# Patient Record
Sex: Male | Born: 1955 | Race: Black or African American | Hispanic: No | Marital: Married | Smoking: Never smoker
Health system: Southern US, Community
[De-identification: ages and names within clinical notes are randomized; demographics above are authoritative.]

## PROBLEM LIST (undated history)

## (undated) DIAGNOSIS — E871 Hypo-osmolality and hyponatremia: Secondary | ICD-10-CM

## (undated) DIAGNOSIS — T56891A Toxic effect of other metals, accidental (unintentional), initial encounter: Secondary | ICD-10-CM

## (undated) DIAGNOSIS — E872 Acidosis, unspecified: Secondary | ICD-10-CM

## (undated) DIAGNOSIS — N183 Chronic kidney disease, stage 3 unspecified: Secondary | ICD-10-CM

## (undated) DIAGNOSIS — S069X9A Unspecified intracranial injury with loss of consciousness of unspecified duration, initial encounter: Secondary | ICD-10-CM

## (undated) DIAGNOSIS — R319 Hematuria, unspecified: Secondary | ICD-10-CM

## (undated) DIAGNOSIS — E875 Hyperkalemia: Secondary | ICD-10-CM

## (undated) DIAGNOSIS — S069XAA Unspecified intracranial injury with loss of consciousness status unknown, initial encounter: Secondary | ICD-10-CM

## (undated) DIAGNOSIS — F0280 Dementia in other diseases classified elsewhere without behavioral disturbance: Secondary | ICD-10-CM

## (undated) DIAGNOSIS — A419 Sepsis, unspecified organism: Secondary | ICD-10-CM

## (undated) DIAGNOSIS — L97409 Non-pressure chronic ulcer of unspecified heel and midfoot with unspecified severity: Secondary | ICD-10-CM

## (undated) DIAGNOSIS — Z2239 Carrier of other specified bacterial diseases: Secondary | ICD-10-CM

## (undated) DIAGNOSIS — I509 Heart failure, unspecified: Secondary | ICD-10-CM

## (undated) DIAGNOSIS — N3289 Other specified disorders of bladder: Secondary | ICD-10-CM

## (undated) DIAGNOSIS — N319 Neuromuscular dysfunction of bladder, unspecified: Secondary | ICD-10-CM

## (undated) DIAGNOSIS — F319 Bipolar disorder, unspecified: Secondary | ICD-10-CM

## (undated) DIAGNOSIS — I10 Essential (primary) hypertension: Secondary | ICD-10-CM

## (undated) DIAGNOSIS — L89159 Pressure ulcer of sacral region, unspecified stage: Secondary | ICD-10-CM

## (undated) DIAGNOSIS — D649 Anemia, unspecified: Secondary | ICD-10-CM

## (undated) DIAGNOSIS — N390 Urinary tract infection, site not specified: Secondary | ICD-10-CM

## (undated) DIAGNOSIS — E669 Obesity, unspecified: Secondary | ICD-10-CM

## (undated) DIAGNOSIS — Z978 Presence of other specified devices: Secondary | ICD-10-CM

## (undated) DIAGNOSIS — Z7901 Long term (current) use of anticoagulants: Secondary | ICD-10-CM

## (undated) DIAGNOSIS — E119 Type 2 diabetes mellitus without complications: Secondary | ICD-10-CM

## (undated) DIAGNOSIS — L0390 Cellulitis, unspecified: Secondary | ICD-10-CM

## (undated) DIAGNOSIS — Z933 Colostomy status: Secondary | ICD-10-CM

## (undated) DIAGNOSIS — N1330 Unspecified hydronephrosis: Secondary | ICD-10-CM

## (undated) DIAGNOSIS — Z86718 Personal history of other venous thrombosis and embolism: Secondary | ICD-10-CM

## (undated) HISTORY — PX: TONSILLECTOMY: SUR1361

## (undated) HISTORY — DX: Bipolar disorder, unspecified: F31.9

## (undated) HISTORY — PX: COLON SURGERY: SHX602

## (undated) HISTORY — PX: BACK SURGERY: SHX140

## (undated) HISTORY — PX: COLOSTOMY: SHX63

## (undated) HISTORY — PX: CARPAL TUNNEL RELEASE: SHX101

## (undated) HISTORY — PX: SACRAL DECUBITUS ULCER EXCISION: SUR512

---

## 2004-12-25 ENCOUNTER — Encounter: Payer: Self-pay | Admitting: Physical Medicine and Rehabilitation

## 2004-12-29 ENCOUNTER — Encounter: Payer: Self-pay | Admitting: Physical Medicine and Rehabilitation

## 2005-01-29 ENCOUNTER — Encounter: Payer: Self-pay | Admitting: Physical Medicine and Rehabilitation

## 2005-03-01 ENCOUNTER — Encounter: Payer: Self-pay | Admitting: Physical Medicine and Rehabilitation

## 2014-07-06 DIAGNOSIS — G4730 Sleep apnea, unspecified: Secondary | ICD-10-CM | POA: Insufficient documentation

## 2014-07-06 DIAGNOSIS — Z7901 Long term (current) use of anticoagulants: Secondary | ICD-10-CM | POA: Insufficient documentation

## 2014-07-06 DIAGNOSIS — S069X0S Unspecified intracranial injury without loss of consciousness, sequela: Secondary | ICD-10-CM

## 2014-07-06 DIAGNOSIS — M7989 Other specified soft tissue disorders: Secondary | ICD-10-CM | POA: Insufficient documentation

## 2014-07-06 DIAGNOSIS — E785 Hyperlipidemia, unspecified: Secondary | ICD-10-CM | POA: Insufficient documentation

## 2014-07-06 DIAGNOSIS — E1129 Type 2 diabetes mellitus with other diabetic kidney complication: Secondary | ICD-10-CM | POA: Insufficient documentation

## 2014-07-06 DIAGNOSIS — F09 Unspecified mental disorder due to known physiological condition: Secondary | ICD-10-CM | POA: Insufficient documentation

## 2014-07-06 DIAGNOSIS — E119 Type 2 diabetes mellitus without complications: Secondary | ICD-10-CM | POA: Insufficient documentation

## 2014-07-06 DIAGNOSIS — N529 Male erectile dysfunction, unspecified: Secondary | ICD-10-CM | POA: Insufficient documentation

## 2014-07-06 DIAGNOSIS — S069X9S Unspecified intracranial injury with loss of consciousness of unspecified duration, sequela: Secondary | ICD-10-CM | POA: Insufficient documentation

## 2014-07-06 DIAGNOSIS — I8290 Acute embolism and thrombosis of unspecified vein: Secondary | ICD-10-CM | POA: Insufficient documentation

## 2014-07-06 DIAGNOSIS — G3189 Other specified degenerative diseases of nervous system: Secondary | ICD-10-CM

## 2014-07-06 DIAGNOSIS — I10 Essential (primary) hypertension: Secondary | ICD-10-CM | POA: Insufficient documentation

## 2014-07-06 DIAGNOSIS — IMO0001 Reserved for inherently not codable concepts without codable children: Secondary | ICD-10-CM | POA: Insufficient documentation

## 2016-01-02 ENCOUNTER — Emergency Department: Payer: Medicare Other

## 2016-01-02 ENCOUNTER — Observation Stay
Admission: EM | Admit: 2016-01-02 | Discharge: 2016-01-04 | Disposition: A | Discharge status: Skilled Nursing Facility | Payer: Medicare Other | Attending: Internal Medicine | Admitting: Internal Medicine

## 2016-01-02 DIAGNOSIS — Z87820 Personal history of traumatic brain injury: Secondary | ICD-10-CM | POA: Insufficient documentation

## 2016-01-02 DIAGNOSIS — T56891A Toxic effect of other metals, accidental (unintentional), initial encounter: Secondary | ICD-10-CM | POA: Diagnosis present

## 2016-01-02 DIAGNOSIS — F319 Bipolar disorder, unspecified: Secondary | ICD-10-CM | POA: Diagnosis present

## 2016-01-02 DIAGNOSIS — E875 Hyperkalemia: Secondary | ICD-10-CM | POA: Diagnosis not present

## 2016-01-02 DIAGNOSIS — Z86718 Personal history of other venous thrombosis and embolism: Secondary | ICD-10-CM | POA: Diagnosis not present

## 2016-01-02 DIAGNOSIS — I10 Essential (primary) hypertension: Secondary | ICD-10-CM | POA: Insufficient documentation

## 2016-01-02 DIAGNOSIS — Z818 Family history of other mental and behavioral disorders: Secondary | ICD-10-CM | POA: Diagnosis not present

## 2016-01-02 DIAGNOSIS — E119 Type 2 diabetes mellitus without complications: Secondary | ICD-10-CM | POA: Insufficient documentation

## 2016-01-02 DIAGNOSIS — Z7901 Long term (current) use of anticoagulants: Secondary | ICD-10-CM | POA: Insufficient documentation

## 2016-01-02 DIAGNOSIS — R29898 Other symptoms and signs involving the musculoskeletal system: Secondary | ICD-10-CM

## 2016-01-02 DIAGNOSIS — T43591A Poisoning by other antipsychotics and neuroleptics, accidental (unintentional), initial encounter: Secondary | ICD-10-CM | POA: Diagnosis not present

## 2016-01-02 DIAGNOSIS — N179 Acute kidney failure, unspecified: Secondary | ICD-10-CM | POA: Diagnosis not present

## 2016-01-02 DIAGNOSIS — R531 Weakness: Secondary | ICD-10-CM

## 2016-01-02 DIAGNOSIS — Z7984 Long term (current) use of oral hypoglycemic drugs: Secondary | ICD-10-CM | POA: Diagnosis not present

## 2016-01-02 DIAGNOSIS — Z9889 Other specified postprocedural states: Secondary | ICD-10-CM | POA: Diagnosis not present

## 2016-01-02 DIAGNOSIS — Z86711 Personal history of pulmonary embolism: Secondary | ICD-10-CM | POA: Insufficient documentation

## 2016-01-02 DIAGNOSIS — N390 Urinary tract infection, site not specified: Secondary | ICD-10-CM | POA: Insufficient documentation

## 2016-01-02 DIAGNOSIS — E785 Hyperlipidemia, unspecified: Secondary | ICD-10-CM | POA: Diagnosis not present

## 2016-01-02 DIAGNOSIS — Z79899 Other long term (current) drug therapy: Secondary | ICD-10-CM | POA: Insufficient documentation

## 2016-01-02 DIAGNOSIS — G459 Transient cerebral ischemic attack, unspecified: Secondary | ICD-10-CM

## 2016-01-02 HISTORY — DX: Personal history of other venous thrombosis and embolism: Z86.718

## 2016-01-02 HISTORY — DX: Essential (primary) hypertension: I10

## 2016-01-02 HISTORY — DX: Type 2 diabetes mellitus without complications: E11.9

## 2016-01-02 LAB — COMPREHENSIVE METABOLIC PANEL
ALT: 23 U/L (ref 17–63)
AST: 24 U/L (ref 15–41)
Albumin: 4.3 g/dL (ref 3.5–5.0)
Alkaline Phosphatase: 70 U/L (ref 38–126)
Anion gap: 5 (ref 5–15)
BUN: 50 mg/dL — ABNORMAL HIGH (ref 6–20)
CO2: 20 mmol/L — ABNORMAL LOW (ref 22–32)
Calcium: 9.1 mg/dL (ref 8.9–10.3)
Chloride: 109 mmol/L (ref 101–111)
Creatinine, Ser: 2 mg/dL — ABNORMAL HIGH (ref 0.61–1.24)
GFR calc Af Amer: 40 mL/min — ABNORMAL LOW (ref >60)
GFR calc non Af Amer: 35 mL/min — ABNORMAL LOW (ref >60)
Glucose, Bld: 156 mg/dL — ABNORMAL HIGH (ref 65–99)
Potassium: 5.7 mmol/L — ABNORMAL HIGH (ref 3.5–5.1)
Sodium: 134 mmol/L — ABNORMAL LOW (ref 135–145)
Total Bilirubin: 0.7 mg/dL (ref 0.3–1.2)
Total Protein: 7.6 g/dL (ref 6.5–8.1)

## 2016-01-02 LAB — URINALYSIS COMPLETE WITH MICROSCOPIC (ARMC ONLY)
Bilirubin Urine: NEGATIVE
Glucose, UA: NEGATIVE mg/dL
Ketones, ur: NEGATIVE mg/dL
Nitrite: NEGATIVE
Protein, ur: 30 mg/dL — AB
Specific Gravity, Urine: 1.015 (ref 1.005–1.030)
Trans Epithel, UA: <1
pH: 5 (ref 5.0–8.0)

## 2016-01-02 LAB — CBC WITH DIFFERENTIAL/PLATELET
Basophils Absolute: 0 10*3/uL (ref 0–0.1)
Basophils Relative: 0 %
Eosinophils Absolute: 0 10*3/uL (ref 0–0.7)
Eosinophils Relative: 0 %
HCT: 40.5 % (ref 40.0–52.0)
Hemoglobin: 13.4 g/dL (ref 13.0–18.0)
Lymphocytes Relative: 11 %
Lymphs Abs: 1.1 10*3/uL (ref 1.0–3.6)
MCH: 28.9 pg (ref 26.0–34.0)
MCHC: 33 g/dL (ref 32.0–36.0)
MCV: 87.5 fL (ref 80.0–100.0)
Monocytes Absolute: 0.7 10*3/uL (ref 0.2–1.0)
Monocytes Relative: 7 %
Neutro Abs: 8.5 10*3/uL — ABNORMAL HIGH (ref 1.4–6.5)
Neutrophils Relative %: 82 %
Platelets: 166 10*3/uL (ref 150–440)
RBC: 4.63 MIL/uL (ref 4.40–5.90)
RDW: 16.4 % — ABNORMAL HIGH (ref 11.5–14.5)
WBC: 10.3 10*3/uL (ref 3.8–10.6)

## 2016-01-02 LAB — URINE DRUG SCREEN, QUALITATIVE (ARMC ONLY)
Amphetamines, Ur Screen: NOT DETECTED
Barbiturates, Ur Screen: NOT DETECTED
Benzodiazepine, Ur Scrn: NOT DETECTED
Cannabinoid 50 Ng, Ur ~~LOC~~: NOT DETECTED
Cocaine Metabolite,Ur ~~LOC~~: NOT DETECTED
MDMA (Ecstasy)Ur Screen: NOT DETECTED
Methadone Scn, Ur: NOT DETECTED
Opiate, Ur Screen: NOT DETECTED
Phencyclidine (PCP) Ur S: NOT DETECTED
Tricyclic, Ur Screen: NOT DETECTED

## 2016-01-02 LAB — LITHIUM LEVEL: Lithium Lvl: 1.8 mmol/L (ref 0.60–1.20)

## 2016-01-02 LAB — ETHANOL: Alcohol, Ethyl (B): <5 mg/dL (ref <5)

## 2016-01-02 LAB — TROPONIN I: Troponin I: <0.03 ng/mL (ref <0.03)

## 2016-01-02 LAB — PROTIME-INR
INR: 1.79
Prothrombin Time: 20.8 seconds — ABNORMAL HIGH (ref 11.4–15.0)

## 2016-01-02 MED ORDER — AMLODIPINE BESYLATE 10 MG PO TABS
10.0000 mg | ORAL_TABLET | Freq: Every day | ORAL | Status: DC
  Administered 2016-01-03: 10 mg via ORAL
  Filled 2016-01-02: qty 1

## 2016-01-02 MED ORDER — SODIUM CHLORIDE 0.9% FLUSH
3.0000 mL | Freq: Two times a day (BID) | INTRAVENOUS | Status: DC
  Administered 2016-01-02 – 2016-01-03 (×3): 3 mL via INTRAVENOUS

## 2016-01-02 MED ORDER — ATORVASTATIN CALCIUM 20 MG PO TABS
80.0000 mg | ORAL_TABLET | Freq: Every day | ORAL | Status: DC
  Administered 2016-01-02 – 2016-01-03 (×2): 80 mg via ORAL
  Filled 2016-01-02 (×2): qty 4

## 2016-01-02 MED ORDER — SODIUM CHLORIDE 0.9 % IV BOLUS (SEPSIS)
500.0000 mL | Freq: Once | INTRAVENOUS | Status: AC
  Administered 2016-01-02: 500 mL via INTRAVENOUS

## 2016-01-02 MED ORDER — WARFARIN SODIUM 5 MG PO TABS
5.0000 mg | ORAL_TABLET | Freq: Every day | ORAL | Status: DC
  Administered 2016-01-03 – 2016-01-04 (×2): 5 mg via ORAL
  Filled 2016-01-02 (×2): qty 1

## 2016-01-02 MED ORDER — WARFARIN SODIUM 1 MG PO TABS
2.0000 mg | ORAL_TABLET | ORAL | Status: DC
  Administered 2016-01-04: 2 mg via ORAL
  Filled 2016-01-02: qty 2

## 2016-01-02 MED ORDER — WARFARIN SODIUM 1 MG PO TABS: 1.0000 mg | ORAL_TABLET | ORAL | Status: DC

## 2016-01-02 MED ORDER — GLIPIZIDE ER 10 MG PO TB24
10.0000 mg | ORAL_TABLET | Freq: Every day | ORAL | Status: DC
  Administered 2016-01-03 – 2016-01-04 (×2): 10 mg via ORAL
  Filled 2016-01-02 (×2): qty 1

## 2016-01-02 MED ORDER — DEXTROSE 5 % IV SOLN
1.0000 g | INTRAVENOUS | Status: DC
  Administered 2016-01-02 – 2016-01-03 (×2): 1 g via INTRAVENOUS
  Filled 2016-01-02 (×3): qty 10

## 2016-01-02 MED ORDER — SODIUM CHLORIDE 0.9 % IV SOLN
INTRAVENOUS | Status: AC
  Administered 2016-01-02 – 2016-01-04 (×3): via INTRAVENOUS

## 2016-01-02 NOTE — Progress Notes (Signed)
Pt admitted to room 256. A&Ox4, VSS, no complaints at this time. Pt oriented to room and unit, and educated on use of call bell, telephone, and safety. Safety contract signed. Skin assessed and telemetry verified with Yasmin, RN. RN will continue to monitor and treat per MD orders. Syliva Overmanassie A Stewart, RN

## 2016-01-02 NOTE — ED Notes (Signed)
Patient states increased weakness. States right leg has been "dragging" for the last 2 days.
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
## 2016-01-02 NOTE — H&P (Addendum)
[name] is an 60 y.o. male.   Chief Complaint: Weakness HPI: Presents with weakness especially in right leg. Fell earlier today and could not get up. Wife states he was dragging right leg. Developed tremors today. Has had poor PO intake past couple of days.   Past Medical History  Diagnosis Date  . Hypertension   . Diabetes mellitus without complication ([place])   . History of blood clots   Bipolar disorder Traumatic Brain injury PE DVT (on chronic anticoagulation)  Past Surgical History  Procedure Laterality Date  . Tonsillectomy    . Back surgery      History reviewed. No pertinent family history.  Blood clots  Social History:  reports that he has never smoked. He has never used smokeless tobacco. He reports that he drinks alcohol. He reports that he does not use illicit drugs.  Allergies: No Known Allergies   (Not in a hospital admission)  Results for orders placed or performed during the hospital encounter of 01/02/16 (from the past 48 hour(s))  Comprehensive metabolic panel     Status: Abnormal   Collection Time: 01/02/16  5:30 PM  Result Value Ref Range   Sodium 134 (L) 135 - 145 mmol/L   Potassium 5.7 (H) 3.5 - 5.1 mmol/L   Chloride 109 101 - 111 mmol/L   CO2 20 (L) 22 - 32 mmol/L   Glucose, Bld 156 (H) 65 - 99 mg/dL   BUN 50 (H) 6 - 20 mg/dL   Creatinine, Ser 2.00 (H) 0.61 - 1.24 mg/dL   Calcium 9.1 8.9 - 10.3 mg/dL   Total Protein 7.6 6.5 - 8.1 g/dL   Albumin 4.3 3.5 - 5.0 g/dL   AST 24 15 - 41 U/L   ALT 23 17 - 63 U/L   Alkaline Phosphatase 70 38 - 126 U/L   Total Bilirubin 0.7 0.3 - 1.2 mg/dL   GFR calc non Af Amer 35 (L) >60 mL/min   GFR calc Af Amer 40 (L) >60 mL/min    Comment: (NOTE) The eGFR has been calculated using the CKD EPI equation. This calculation has not been validated in all clinical situations. eGFR's persistently <60 mL/min signify possible Chronic Kidney Disease.    Anion gap 5 5 - 15  CBC with Differential     Status:  Abnormal   Collection Time: 01/02/16  5:30 PM  Result Value Ref Range   WBC 10.3 3.8 - 10.6 K/uL   RBC 4.63 4.40 - 5.90 MIL/uL   Hemoglobin 13.4 13.0 - 18.0 g/dL   HCT 40.5 40.0 - 52.0 %   MCV 87.5 80.0 - 100.0 fL   MCH 28.9 26.0 - 34.0 pg   MCHC 33.0 32.0 - 36.0 g/dL   RDW 16.4 (H) 11.5 - 14.5 %   Platelets 166 150 - 440 K/uL   Neutrophils Relative % 82 %   Neutro Abs 8.5 (H) 1.4 - 6.5 K/uL   Lymphocytes Relative 11 %   Lymphs Abs 1.1 1.0 - 3.6 K/uL   Monocytes Relative 7 %   Monocytes Absolute 0.7 0.2 - 1.0 K/uL   Eosinophils Relative 0 %   Eosinophils Absolute 0.0 0 - 0.7 K/uL   Basophils Relative 0 %   Basophils Absolute 0.0 0 - 0.1 K/uL  Ethanol     Status: None   Collection Time: 01/02/16  5:30 PM  Result Value Ref Range   Alcohol, Ethyl (B) <5 <5 mg/dL    Comment:  LOWEST DETECTABLE LIMIT FOR SERUM ALCOHOL IS 5 mg/dL FOR MEDICAL PURPOSES ONLY   Protime-INR     Status: Abnormal   Collection Time: 01/02/16  5:30 PM  Result Value Ref Range   Prothrombin Time 20.8 (H) 11.4 - 15.0 seconds   INR 1.79   Troponin I     Status: None   Collection Time: 01/02/16  5:30 PM  Result Value Ref Range   Troponin I <0.03 <0.03 ng/mL  Urinalysis complete, with microscopic     Status: Abnormal   Collection Time: 01/02/16  5:30 PM  Result Value Ref Range   Color, Urine YELLOW (A) YELLOW   APPearance CLOUDY (A) CLEAR   Glucose, UA NEGATIVE NEGATIVE mg/dL   Bilirubin Urine NEGATIVE NEGATIVE   Ketones, ur NEGATIVE NEGATIVE mg/dL   Specific Gravity, Urine 1.015 1.005 - 1.030   Hgb urine dipstick 2+ (A) NEGATIVE   pH 5.0 5.0 - 8.0   Protein, ur 30 (A) NEGATIVE mg/dL   Nitrite NEGATIVE NEGATIVE   Leukocytes, UA 3+ (A) NEGATIVE   RBC / HPF 6-30 0 - 5 RBC/hpf   WBC, UA TOO NUMEROUS TO COUNT 0 - 5 WBC/hpf   Bacteria, UA MANY (A) NONE SEEN   Squamous Epithelial / LPF 0-5 (A) NONE SEEN   Trans Epithel, UA <1    WBC Clumps PRESENT    Mucous PRESENT    Hyaline Casts, UA PRESENT    Urine Drug Screen, Qualitative     Status: None   Collection Time: 01/02/16  5:30 PM  Result Value Ref Range   Tricyclic, Ur Screen NONE DETECTED NONE DETECTED   Amphetamines, Ur Screen NONE DETECTED NONE DETECTED   MDMA (Ecstasy)Ur Screen NONE DETECTED NONE DETECTED   Cocaine Metabolite,Ur [place] NONE DETECTED NONE DETECTED   Opiate, Ur Screen NONE DETECTED NONE DETECTED   Phencyclidine (PCP) Ur S NONE DETECTED NONE DETECTED   Cannabinoid 50 Ng, Ur  NONE DETECTED NONE DETECTED   Barbiturates, Ur Screen NONE DETECTED NONE DETECTED   Benzodiazepine, Ur Scrn NONE DETECTED NONE DETECTED   Methadone Scn, Ur NONE DETECTED NONE DETECTED    Comment: (NOTE) 939  Tricyclics, urine               Cutoff 1000 ng/mL 200  Amphetamines, urine             Cutoff 1000 ng/mL 300  MDMA (Ecstasy), urine           Cutoff 500 ng/mL 400  Cocaine Metabolite, urine       Cutoff 300 ng/mL 500  Opiate, urine                   Cutoff 300 ng/mL 600  Phencyclidine (PCP), urine      Cutoff 25 ng/mL 700  Cannabinoid, urine              Cutoff 50 ng/mL 800  Barbiturates, urine             Cutoff 200 ng/mL 900  Benzodiazepine, urine           Cutoff 200 ng/mL 1000 Methadone, urine                Cutoff 300 ng/mL 1100 1200 The urine drug screen provides only a preliminary, unconfirmed 1300 analytical test result and should not be used for non-medical 1400 purposes. Clinical consideration and professional judgment should 1500 be applied to any positive drug screen result due to possible 1600 interfering substances.  A more specific alternate chemical method 1700 must be used in order to obtain a confirmed analytical result.  1800 Gas chromato graphy / mass spectrometry (GC/MS) is the preferred 1900 confirmatory method.   Lithium level     Status: Abnormal   Collection Time: 01/02/16  6:00 PM  Result Value Ref Range   Lithium Lvl 1.80 (HH) 0.60 - 1.20 mmol/L    Comment: CRITICAL RESULT CALLED TO, READ BACK BY AND  VERIFIED WITH TERRY [facility] RN AT 7096 01/02/16 MSS.    Ct Head Wo Contrast  01/02/2016  CLINICAL DATA:  Two day history of right lower extremity weakness EXAM: CT HEAD WITHOUT CONTRAST TECHNIQUE: Contiguous axial images were obtained from the base of the skull through the vertex without intravenous contrast. COMPARISON:  None. FINDINGS: Brain: The ventricles are normal in size and configuration. There is no intracranial mass, hemorrhage, extra-axial fluid collection, or midline shift. Gray-white compartments appear unremarkable. No acute infarct evident. Vascular: There are a few scattered atherosclerotic calcifications in the distal internal carotid arteries bilaterally. Skull: Bony calvarium appears intact. Sinuses/Orbits: Orbits appear symmetric bilaterally. Visualized paranasal sinuses clear. Other: Mastoid air cells are clear. IMPRESSION: No intracranial mass, hemorrhage, or focal gray -white compartment lesions/ acute appearing infarct. Occasional atherosclerotic vascular calcifications noted. Electronically Signed   By: Lowella Grip III M.D.   On: 01/02/2016 17:17    Review of Systems  Constitutional: Negative for fever and chills.  HENT: Negative for hearing loss.   Eyes: Negative for blurred vision and double vision.  Respiratory: Negative for cough.   Cardiovascular: Negative for chest pain.  Gastrointestinal: Negative for nausea.  Genitourinary: Negative for dysuria.  Musculoskeletal: Negative for back pain.  Skin: Negative for rash.  Neurological: Positive for tremors and focal weakness.    Blood pressure 111/61, pulse 71, temperature 98 F (36.7 C), temperature source Oral, resp. rate 15, height _0  (1.676 m), weight 119 kg (262 lb 5.6 oz), SpO2 99 %. Physical Exam  Constitutional: He is oriented to person, place, and time. He appears well-developed and well-nourished. No distress.  HENT:  Head: Normocephalic and atraumatic.  Mouth/Throat: No oropharyngeal exudate.   Oral mucosa dry  Eyes: Pupils are equal, round, and reactive to light. No scleral icterus.  Neck: No JVD present. No tracheal deviation present. No thyromegaly present.  Cardiovascular: Normal rate.   No murmur heard. Respiratory: Effort normal. No respiratory distress. He exhibits no tenderness.  GI: Soft. Bowel sounds are normal. He exhibits no distension and no mass.  Musculoskeletal: He exhibits no edema or tenderness.  Lymphadenopathy:    He has no cervical adenopathy.  Neurological: He is alert and oriented to person, place, and time. No cranial nerve deficit.  Positive tremor. Mild weakness in right leg  Skin: Skin is warm and dry. No erythema.     Assessment/Plan 1. Lithium Toxicity: Likely secondary to acute renal failure. Will hold lithium and give aggressive IVF. This would explain symptoms patient is experiencing. Will consult nephrology for possible HD if needed.  2 .Acute Renal Failure: Likely dehydration from poor PO intake. Give IVF. Hold glucophage, HCTZ, spironolactone until renal function better.  3. HTN: Holding above meds for renal failure. Will give different meds for BP if needed otherwise restart above when renal function better.  4. Weakness: Likely from lithium toxicity. If symptoms the right leg symptoms do not improve then may need further neurological work up.  5. Hx DVT and PE: On coumadin.  6. UTI: Start rocephin.  Time spent= 55 min  Baxter Hire, MD 01/02/2016, 7:42 PM

## 2016-01-03 ENCOUNTER — Inpatient Hospital Stay: Payer: Medicare Other

## 2016-01-03 ENCOUNTER — Encounter: Payer: Self-pay | Admitting: Psychiatry

## 2016-01-03 DIAGNOSIS — F3170 Bipolar disorder, currently in remission, most recent episode unspecified: Secondary | ICD-10-CM

## 2016-01-03 DIAGNOSIS — F319 Bipolar disorder, unspecified: Secondary | ICD-10-CM | POA: Diagnosis present

## 2016-01-03 LAB — BASIC METABOLIC PANEL
Anion gap: 3 — ABNORMAL LOW (ref 5–15)
BUN: 47 mg/dL — ABNORMAL HIGH (ref 6–20)
CO2: 20 mmol/L — ABNORMAL LOW (ref 22–32)
Calcium: 8.3 mg/dL — ABNORMAL LOW (ref 8.9–10.3)
Chloride: 113 mmol/L — ABNORMAL HIGH (ref 101–111)
Creatinine, Ser: 1.77 mg/dL — ABNORMAL HIGH (ref 0.61–1.24)
GFR calc Af Amer: 46 mL/min — ABNORMAL LOW (ref >60)
GFR calc non Af Amer: 40 mL/min — ABNORMAL LOW (ref >60)
Glucose, Bld: 71 mg/dL (ref 65–99)
Potassium: 4.7 mmol/L (ref 3.5–5.1)
Sodium: 136 mmol/L (ref 135–145)

## 2016-01-03 LAB — GLUCOSE, CAPILLARY
Glucose-Capillary: 108 mg/dL — ABNORMAL HIGH (ref 65–99)
Glucose-Capillary: 148 mg/dL — ABNORMAL HIGH (ref 65–99)
Glucose-Capillary: 127 mg/dL — ABNORMAL HIGH (ref 65–99)

## 2016-01-03 LAB — LITHIUM LEVEL
Lithium Lvl: 1.6 mmol/L (ref 0.60–1.20)
Lithium Lvl: 1.51 mmol/L (ref 0.60–1.20)

## 2016-01-03 LAB — HEMOGLOBIN A1C: Hgb A1c MFr Bld: 7 % — ABNORMAL HIGH (ref 4.0–6.0)

## 2016-01-03 LAB — PROTIME-INR
INR: 2.05
Prothrombin Time: 23 seconds — ABNORMAL HIGH (ref 11.4–15.0)

## 2016-01-03 MED ORDER — OXCARBAZEPINE 150 MG PO TABS
150.0000 mg | ORAL_TABLET | Freq: Two times a day (BID) | ORAL | Status: DC
  Administered 2016-01-03 – 2016-01-04 (×2): 150 mg via ORAL
  Filled 2016-01-03 (×4): qty 1

## 2016-01-03 MED ORDER — INSULIN ASPART 100 UNIT/ML ~~LOC~~ SOLN: 0.0000 [IU] | Freq: Every day | SUBCUTANEOUS | Status: DC

## 2016-01-03 MED ORDER — INSULIN ASPART 100 UNIT/ML ~~LOC~~ SOLN
0.0000 [IU] | Freq: Three times a day (TID) | SUBCUTANEOUS | Status: DC
  Administered 2016-01-03: 1 [IU] via SUBCUTANEOUS
  Administered 2016-01-04: 2 [IU] via SUBCUTANEOUS
  Filled 2016-01-03: qty 2
  Filled 2016-01-03: qty 1

## 2016-01-03 MED ORDER — SIMETHICONE 80 MG PO CHEW
80.0000 mg | CHEWABLE_TABLET | Freq: Four times a day (QID) | ORAL | Status: DC | PRN
  Administered 2016-01-03: 80 mg via ORAL
  Filled 2016-01-03 (×3): qty 1

## 2016-01-03 MED ORDER — AMLODIPINE BESYLATE 5 MG PO TABS
5.0000 mg | ORAL_TABLET | Freq: Every day | ORAL | Status: DC
  Administered 2016-01-04: 5 mg via ORAL
  Filled 2016-01-03: qty 1

## 2016-01-03 NOTE — Progress Notes (Signed)
Pt requesting "something for gas", MD Dr. Joneen Roachrosley notified, orders placed for mylicon prn. RN will continue to monitor. Syliva Overmanassie A Stewart, RN

## 2016-01-03 NOTE — Care Management (Signed)
Patient presents from home with increasing weakness, tremors and poor appetite.  Found to have lithium toxicity and acute renal failure. He lives with his wife and he was able to ambulate independently with a walker prior to this illness.  Physical therapy has evaluated and recommending skilled nursing placement.  Patient says he would rather go home to receive the service and verbalizes his functional status may improve by tomorrow.   His lithium level is declining and renal status slowly improving.
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
## 2016-01-03 NOTE — Consult Note (Signed)
[facility] CONSULT NOTE    Date: 01/03/2016                  Patient Name:  [name]  MRN: [id number]  DOB: 1955/12/31  Age / Sex: 60 y.o., male         PCP: Dione BoozePARTRIDGE,JAMES, MD                 Service Requesting Consult: [facility]                 Reason for Consult: Acute renal failure/lithium toxicity            History of Present Illness: Patient is a 60 y.o. male with a PMHx of Hypertension, diabetes mellitus type 2, history of bipolar disorder, traumatic brain injury, history of PE, history of DVT, who was [facility] for evaluation of weakness status post fall.  The patient has known history of bipolar disorder and is on lithium carbonate for this. Over the past week he's had rather poor appetite and poor by mouth intake per his report as well as his wife. He recently has been dragging his right foot when walking. He's also had a tremor that was notable on exam today.  He appears to have acute renal failure now. Creatinine upon admission was 2.0 with a BUN of 50. Creatinine is now down to 1.7.  Lithium level was 1.8 upon admission but is now down to 1.51. Patient is currently awake alert and following commands.   Medications: Outpatient medications: Prescriptions prior to admission  Medication Sig Dispense Refill Last Dose  . amLODipine (NORVASC) 10 MG tablet Take 10 mg by mouth daily.   01/02/2016 at Unknown time  . atorvastatin (LIPITOR) 80 MG tablet Take 80 mg by mouth at bedtime.   01/01/2016 at Unknown time  . glipiZIDE (GLUCOTROL XL) 10 MG 24 hr tablet Take 10 mg by mouth daily with breakfast.    01/02/2016 at Unknown time  . lithium carbonate (ESKALITH) 450 MG CR tablet Take 450 mg by mouth daily.   01/02/2016 at Unknown time  . metFORMIN (GLUCOPHAGE-XR) 500 MG 24 hr tablet Take 1,000 mg by mouth at bedtime.    01/01/2016 at Unknown time  . spironolactone (ALDACTONE) 100 MG tablet Take 100 mg by mouth daily.    01/02/2016 at Unknown time  . valsartan-hydrochlorothiazide (DIOVAN-HCT) 320-25 MG tablet Take 1 tablet by mouth daily.   12/31/2015  . warfarin (COUMADIN) 1 MG tablet Take 1 mg by mouth See admin instructions. Take 2 tablets (2 mg) on Monday and Thursday only along with 5 mg tablet to equal 7 mg total.   01/01/2016 at Unknown time  . warfarin (COUMADIN) 5 MG tablet Take 5 mg by mouth daily. *Take along with 2 tablets of 1 mg to equal 7 mg on Monday and Thursday only*   01/02/2016 at Unknown time    Current medications: Current Facility-Administered Medications  Medication Dose Route Frequency Provider Last Rate Last Dose  . 0.9 %  sodium chloride infusion   Intravenous Continuous Enid Baasadhika Kalisetti, MD   Stopped at 01/03/16 1315  . [START ON 01/04/2016] amLODipine (NORVASC) tablet 5 mg  5 mg Oral Daily Enid Baasadhika Kalisetti, MD      . atorvastatin (LIPITOR) tablet 80 mg  80 mg Oral QHS Gracelyn NurseJohn D Johnston, MD   80 mg at 01/02/16 2152  . cefTRIAXone (ROCEPHIN) 1 g in dextrose 5 % 50 mL IVPB  1  g Intravenous Q24H Gracelyn Nurse, MD   1 g at 01/02/16 2152  . glipiZIDE (GLUCOTROL XL) 24 hr tablet 10 mg  10 mg Oral Q breakfast Gracelyn Nurse, MD   10 mg at 01/03/16 1610  . insulin aspart (novoLOG) injection 0-5 Units  0-5 Units Subcutaneous QHS Enid Baas, MD      . insulin aspart (novoLOG) injection 0-9 Units  0-9 Units Subcutaneous TID WC Enid Baas, MD   0 Units at 01/03/16 1203  . OXcarbazepine (TRILEPTAL) tablet 150 mg  150 mg Oral BID Jolanta B Pucilowska, MD      . sodium chloride flush (NS) 0.9 % injection 3 mL  3 mL Intravenous Q12H Gracelyn Nurse, MD   3 mL at 01/03/16 1024  . [START ON 01/04/2016] warfarin (COUMADIN) tablet 2 mg  2 mg Oral Once per day on Mon Thu Gracelyn Nurse, MD      . warfarin (COUMADIN) tablet 5 mg  5 mg Oral q1800 Gracelyn Nurse, MD          Allergies: No Known Allergies    Past Medical History: Past Medical History  Diagnosis Date  . Hypertension   .  Diabetes mellitus without complication (HCC)   . History of blood clots      Past Surgical History: Past Surgical History  Procedure Laterality Date  . Tonsillectomy    . Back surgery       Family History: History reviewed. No pertinent family history.   Social History: Social History   Social History  . Marital Status: Married    Spouse Name: N/A  . Number of Children: N/A  . Years of Education: N/A   Occupational History  . Not on file.   Social History Main Topics  . Smoking status: Never Smoker   . Smokeless tobacco: Never Used  . Alcohol Use: Yes  . Drug Use: No  . Sexual Activity: Not on file   Other Topics Concern  . Not on file   Social History Narrative     Review of Systems: Review of Systems  Constitutional: Positive for malaise/fatigue. Negative for fever, chills and weight loss.  HENT: Negative for ear pain, hearing loss and tinnitus.   Eyes: Negative for blurred vision and double vision.  Respiratory: Negative for cough, hemoptysis and sputum production.   Cardiovascular: Negative for chest pain, palpitations and orthopnea.  Gastrointestinal: Negative for heartburn, nausea and vomiting.  Genitourinary: Negative for dysuria, urgency and frequency.  Musculoskeletal: Negative for myalgias and back pain.  Skin: Negative for itching and rash.  Neurological: Positive for dizziness, tremors and weakness. Negative for loss of consciousness and headaches.  Endo/Heme/Allergies: Negative for polydipsia. Does not bruise/bleed easily.  Psychiatric/Behavioral: Negative for depression and hallucinations.     Vital Signs: Blood pressure 104/51, pulse 52, temperature 98.2 F (36.8 C), temperature source Oral, resp. rate 16, height  (1.676 m), weight 117.618 kg (259 lb 4.8 oz), SpO2 97 %.  Weight trends: Filed Weights   01/02/16 1651 01/02/16 2050  Weight: 119 kg (262 lb 5.6 oz) 117.618 kg (259 lb 4.8 oz)    Physical Exam: General: NAD, sitting  up  Head: Normocephalic, atraumatic.  Eyes: Anicteric, PEERL  Nose: Mucous membranes moist, not inflammed, nonerythematous.  Throat: Oropharynx nonerythematous, no exudate appreciated.   Neck: Supple, trachea midline.  Lungs:  Normal respiratory effort. Clear to auscultation BL without crackles or wheezes.  Heart: RRR. S1 and S2 normal without gallop, murmur, or  rubs.  Abdomen:  BS normoactive. Soft, Nondistended, non-tender.  No masses or organomegaly.  Extremities: No pretibial edema.  Neurologic: A&O X3, Motor strength is 5/5 in the all 4 extremities, resting tremor noted  Skin: No visible rashes, scars.    Lab results: Basic Metabolic Panel:  Recent Labs Lab 01/02/16 1730 01/03/16 0336  NA 134* 136  K 5.7* 4.7  CL 109 113*  CO2 20* 20*  GLUCOSE 156* 71  BUN 50* 47*  CREATININE 2.00* 1.77*  CALCIUM 9.1 8.3*    Liver Function Tests:  Recent Labs Lab 01/02/16 1730  AST 24  ALT 23  ALKPHOS 70  BILITOT 0.7  PROT 7.6  ALBUMIN 4.3   No results for input(s): LIPASE, AMYLASE in the last 168 hours. No results for input(s): AMMONIA in the last 168 hours.  CBC:  Recent Labs Lab 01/02/16 1730  WBC 10.3  NEUTROABS 8.5*  HGB 13.4  HCT 40.5  MCV 87.5  PLT 166    Cardiac Enzymes:  Recent Labs Lab 01/02/16 1730  TROPONINI <0.03    BNP: Invalid input(s): POCBNP  CBG:  Recent Labs Lab 01/03/16 1159  GLUCAP 108*    Microbiology: No results found for this or any previous visit.  Coagulation Studies:  Recent Labs  01/02/16 1730 01/03/16 0336  LABPROT 20.8* 23.0*  INR 1.79 2.05    Urinalysis:  Recent Labs  01/02/16 1730  COLORURINE YELLOW*  LABSPEC 1.015  PHURINE 5.0  GLUCOSEU NEGATIVE  HGBUR 2+*  BILIRUBINUR NEGATIVE  KETONESUR NEGATIVE  PROTEINUR 30*  NITRITE NEGATIVE  LEUKOCYTESUR 3+*      Imaging: Ct Head Wo Contrast  01/02/2016  CLINICAL DATA:  Two day history of right lower extremity weakness EXAM: CT HEAD WITHOUT  CONTRAST TECHNIQUE: Contiguous axial images were obtained from the base of the skull through the vertex without intravenous contrast. COMPARISON:  None. FINDINGS: Brain: The ventricles are normal in size and configuration. There is no intracranial mass, hemorrhage, extra-axial fluid collection, or midline shift. Gray-white compartments appear unremarkable. No acute infarct evident. Vascular: There are a few scattered atherosclerotic calcifications in the distal internal carotid arteries bilaterally. Skull: Bony calvarium appears intact. Sinuses/Orbits: Orbits appear symmetric bilaterally. Visualized paranasal sinuses clear. Other: Mastoid air cells are clear. IMPRESSION: No intracranial mass, hemorrhage, or focal gray -white compartment lesions/ acute appearing infarct. Occasional atherosclerotic vascular calcifications noted. Electronically Signed   By: Bretta Bang III M.D.   On: 01/02/2016 17:17   Mr Brain Wo Contrast  01/03/2016  CLINICAL DATA:  60 year old male with fall and right lower extremity weakness such that he was unable to get up. Was tracking the right leg. Initial encounter. EXAM: MRI HEAD WITHOUT CONTRAST TECHNIQUE: Multiplanar, multiecho pulse sequences of the brain and surrounding structures were obtained without intravenous contrast. COMPARISON:  Noncontrast head CT 01/02/2016. FINDINGS: Cerebral volume is within normal limits for age. No restricted diffusion to suggest acute infarction. No midline shift, mass effect, evidence of mass lesion, ventriculomegaly, extra-axial collection or acute intracranial hemorrhage. Cervicomedullary junction and pituitary are within normal limits. Major intracranial vascular flow voids are preserved. Wallace Cullens and white matter signal is within normal limits for age throughout the brain. No encephalomalacia or chronic cerebral blood products identified. Grossly normal visualized internal auditory structures. Paranasal sinuses and mastoids are clear. Negative  orbit and scalp soft tissues. Visualized bone marrow signal is within normal limits. There is evidence of upper cervical spine disc degeneration which may result in mild spinal stenosis at C2-C3 as  seen on series 2, image 16. IMPRESSION: 1. No acute intracranial abnormality. Normal for age noncontrast MRI appearance of the brain. 2. Possible mild degenerative spinal stenosis in the visible upper cervical spine. Electronically Signed   By: Odessa FlemingH  Hall M.D.   On: 01/03/2016 14:09   [place] Carotid Bilateral  01/03/2016  CLINICAL DATA:  TIA.  Hypertension, hyperlipidemia, diabetes. EXAM: BILATERAL CAROTID DUPLEX ULTRASOUND TECHNIQUE: Wallace CullensGray scale imaging, color Doppler and duplex ultrasound was performed of bilateral carotid and vertebral arteries in the neck. COMPARISON:  None. TECHNIQUE: Quantification of carotid stenosis is based on velocity parameters that correlate the residual internal carotid diameter with NASCET-based stenosis levels, using the diameter of the distal internal carotid lumen as the denominator for stenosis measurement. The following velocity measurements were obtained: PEAK SYSTOLIC/END DIASTOLIC RIGHT ICA:                     83/21cm/sec CCA:                     133/13cm/sec SYSTOLIC ICA/CCA RATIO:  0.6 DIASTOLIC ICA/CCA RATIO: 1.7 ECA:                     122cm/sec LEFT ICA:                     62/18cm/sec CCA:                     117/17cm/sec SYSTOLIC ICA/CCA RATIO:  0.5 DIASTOLIC ICA/CCA RATIO: 1.1 ECA:                     114cm/sec FINDINGS: RIGHT CAROTID ARTERY: No plaque or stenosis. Normal waveforms and color Doppler signal. RIGHT VERTEBRAL ARTERY:  Normal flow direction and waveform. LEFT CAROTID ARTERY: Mild eccentric noncalcified plaque in the carotid bulb. No high-grade stenosis. Normal waveforms and color Doppler signal. LEFT VERTEBRAL ARTERY: Normal flow direction and waveform. IMPRESSION: 1. Mild left carotid bifurcation plaque resulting in less than 50% diameter stenosis. The exam does not  exclude plaque ulceration or embolization. Continued surveillance recommended. 2.  Antegrade bilateral vertebral arterial flow. Electronically Signed   By: Corlis Leak  Hassell M.D.   On: 01/03/2016 15:11      Assessment & Plan: Pt is a 60 y.o. male  with a PMHx of Hypertension, diabetes mellitus type 2, history of bipolar disorder, traumatic brain injury, history of PE, history of DVT, who was [facility] 01/02/2016 for evaluation of weakness status post fall.  The patient has known history of bipolar disorder and is on lithium carbonate for this.  1.  Acute renal failure. 2.  Lithium toxicity. 3.  Hyperkalemia, improved. 4.  Urinary tract infection. 5.  Hypertension.  Plan:  The patient was originally having poor by mouth intake and anorexia at home. This likely resulted in acute renal failure which subsequently led to lithium toxicity. In addition he appears to have a urinary tract infection as he has significant pyuria. Continue IV fluid hydration to treat the acute renal failure as well as the lithium toxicity. No acute indication for dialysis at the moment as the patient is currently awake alert and following commands. He has a very mild tremor on exam. Continue physical therapy. In addition continue ceftriaxone for the underlying urinary tract infection.  Obtain renal ultrasound for underlying acute renal failure. Further plan as patient progresses.

## 2016-01-03 NOTE — Evaluation (Signed)
Physical Therapy Evaluation Patient Details Name: [name] MaffucciJoseph [name] MRN: [id number] DOB: [birth year] Today's Date: 01/03/2016   History of Present Illness  Pt is a pleasant 60 y/o male with history of TBI and mild R sided weakness, on lithium to manage bipolar disorder, presenting with RLE "weakness". Patient has been noted to have been dragging his RLE at home, he sustained a fall and waited ~1 hour for wife to return to assist in standing. He was noted to have AKI and possible lithium toxicity.   Clinical Impression  Patient's wife reports the past 2 days he was noted to be "dragging" his RLE forward, unable to lift off the floor to advance forward. He has been increasingly more active up until this point as he was going to the gym, water aerobics, and uses a 4WW at baseline. Today he is noted to have difficulty bringing RLE out of the bed, difficulty finding correct foot placement in sit to stand transfer, and poor advancement of RLE in gait. Per wife this is improved, and on several occasions he shows reciprocal pattern x 2-3 steps before reverting back to decreased step length/poor foot clearance on RLE. Given that this is different than his baseline and is impeding his functional mobility, would recommend he transition to SNF at discharge at this time, family in agreement. PT will continue to advance and follow as tolerated.     Follow Up Recommendations SNF    Equipment Recommendations  Rolling walker with 5" wheels    Recommendations for Other Services       Precautions / Restrictions Precautions Precautions: Fall Restrictions Weight Bearing Restrictions: No      Mobility  Bed Mobility Overal bed mobility: Needs Assistance Bed Mobility: Supine to Sit     Supine to sit: Min assist;Mod assist     General bed mobility comments: Patient struggles to bring RLE to EOB, hooks LLE underneath. Requires heavy use of UEs to complete transfer via bed rails.   Transfers Overall  transfer level: Needs assistance Equipment used: Rolling walker (2 wheeled) Transfers: Sit to/from Stand Sit to Stand: Min guard;Min assist         General transfer comment: Appropriate weight shift, minimal assistance required, primarily for balance from PT.   Ambulation/Gait Ambulation/Gait assistance: Min assist Ambulation Distance (Feet): 30 Feet Assistive device: Rolling walker (2 wheeled) Gait Pattern/deviations: Decreased step length - right;Narrow base of support;Trunk flexed   Gait velocity interpretation: Below normal speed for age/gender General Gait Details: Patient noted to drag RLE (poor floor clearance, poor advancement) on multiple occasions, he was able to advance more reciprocally intermittently with cuing. He became quite fatigued, scissored gait pattern especially with turning. No buckling noted.   Stairs            Wheelchair Mobility    Modified Rankin (Stroke Patients Only)       Balance Overall balance assessment: Needs assistance Sitting-balance support: No upper extremity supported Sitting balance-Leahy Scale: Fair Sitting balance - Comments: Posterior lean noted with MMT Postural control: Posterior lean Standing balance support: Bilateral upper extremity supported Standing balance-Leahy Scale: Fair                               Pertinent Vitals/Pain Pain Assessment: No/denies pain    Home Living Family/patient expects to be discharged to:: Private residence Living Arrangements: Spouse/significant other Available Help at Discharge: Family Type of Home: House Home Access:  (Did not specify)  Home Equipment: Walker - 4 wheels      Prior Function Level of Independence: Independent with assistive device(s)         Comments: Patient has been exercising, treadmill, personal trainer, doing water aerobics.      Hand Dominance        Extremity/Trunk Assessment   Upper Extremity Assessment: Overall WFL for  tasks assessed           Lower Extremity Assessment:  (Noted in BLE difficulty with ankle pumps, at knee/hip no discernable MMT deficits bilaterally. Able to SLR and heel slide symmetrically. Denies numbness and tingling, able to report light sensation on RLE.)         Communication   Communication: No difficulties  Cognition Arousal/Alertness: Awake/alert Behavior During Therapy: WFL for tasks assessed/performed Overall Cognitive Status: Within Functional Limits for tasks assessed                      General Comments      Exercises        Assessment/Plan    PT Assessment Patient needs continued PT services  PT Diagnosis Difficulty walking;Abnormality of gait   PT Problem List Decreased strength;Decreased knowledge of use of DME;Decreased balance;Decreased mobility  PT Treatment Interventions DME instruction;Gait training;Stair training;Therapeutic activities;Therapeutic exercise;Balance training   PT Goals (Current goals can be found in the Care Plan section) Acute Rehab PT Goals Patient Stated Goal: To get stronger and more independent.  PT Goal Formulation: With patient/family Time For Goal Achievement: 01/17/16 Potential to Achieve Goals: Good    Frequency Min 2X/week   Barriers to discharge        Co-evaluation               End of Session Equipment Utilized During Treatment: Gait belt Activity Tolerance: Patient tolerated treatment well;Patient limited by fatigue Patient left: in chair;with call bell/phone within reach;with chair alarm set;with family/visitor present;with nursing/sitter in room Nurse Communication: Mobility status         Time: [id number] PT Time Calculation (min) (ACUTE ONLY): 23 min   Charges:   PT Evaluation $PT Eval Moderate Complexity: 1 Procedure     PT G Codes:       Kerin RansomPatrick A McNamara, PT, DPT    01/03/2016, 10:19 AM

## 2016-01-04 ENCOUNTER — Encounter
Admission: RE | Admit: 2016-01-04 | Discharge: 2016-01-04 | Disposition: A | Discharge status: Home / Self Care | Payer: Medicare Other | Source: Ambulatory Visit | Attending: Internal Medicine | Admitting: Internal Medicine

## 2016-01-04 DIAGNOSIS — Z7901 Long term (current) use of anticoagulants: Secondary | ICD-10-CM | POA: Insufficient documentation

## 2016-01-04 DIAGNOSIS — I82409 Acute embolism and thrombosis of unspecified deep veins of unspecified lower extremity: Secondary | ICD-10-CM | POA: Insufficient documentation

## 2016-01-04 LAB — BASIC METABOLIC PANEL
Anion gap: 2 — ABNORMAL LOW (ref 5–15)
BUN: 31 mg/dL — ABNORMAL HIGH (ref 6–20)
CO2: 19 mmol/L — ABNORMAL LOW (ref 22–32)
Calcium: 8.5 mg/dL — ABNORMAL LOW (ref 8.9–10.3)
Chloride: 116 mmol/L — ABNORMAL HIGH (ref 101–111)
Creatinine, Ser: 1.44 mg/dL — ABNORMAL HIGH (ref 0.61–1.24)
GFR calc Af Amer: 60 mL/min — ABNORMAL LOW (ref >60)
GFR calc non Af Amer: 51 mL/min — ABNORMAL LOW (ref >60)
Glucose, Bld: 153 mg/dL — ABNORMAL HIGH (ref 65–99)
Potassium: 4.7 mmol/L (ref 3.5–5.1)
Sodium: 137 mmol/L (ref 135–145)

## 2016-01-04 LAB — GLUCOSE, CAPILLARY
Glucose-Capillary: 114 mg/dL — ABNORMAL HIGH (ref 65–99)
Glucose-Capillary: 178 mg/dL — ABNORMAL HIGH (ref 65–99)
Glucose-Capillary: 113 mg/dL — ABNORMAL HIGH (ref 65–99)

## 2016-01-04 LAB — LITHIUM LEVEL: Lithium Lvl: 1.24 mmol/L — ABNORMAL HIGH (ref 0.60–1.20)

## 2016-01-04 MED ORDER — MAGNESIUM HYDROXIDE 400 MG/5ML PO SUSP: 30.0000 mL | Freq: Every day | ORAL | Status: DC | PRN

## 2016-01-04 MED ORDER — CEPHALEXIN 250 MG PO CAPS: 250.0000 mg | ORAL_CAPSULE | Freq: Three times a day (TID) | ORAL | Status: DC

## 2016-01-04 MED ORDER — AMLODIPINE BESYLATE 5 MG PO TABS: 5.0000 mg | ORAL_TABLET | Freq: Every day | ORAL | Status: DC

## 2016-01-04 MED ORDER — OXCARBAZEPINE 150 MG PO TABS: 150.0000 mg | ORAL_TABLET | Freq: Two times a day (BID) | ORAL | Status: DC
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
## 2016-01-04 NOTE — NC FL2 (Signed)
[place] MEDICAID FL2 LEVEL OF CARE SCREENING TOOL     IDENTIFICATION  Patient Name: Darnelle MaffucciJoseph Hayse Birthdate: January 28, 1956 Sex: male Admission Date (Current Location): 01/02/2016  [place]   Facility and Address:  [facility], [address], [place], [place] [address]      Provider Number: [id number]  Attending Physician Name and Address:  Enid Baasadhika Kalisetti, MD  Relative Name and Phone Number:       Current Level of Care: Hospital Recommended Level of Care:   Prior Approval Number:    Date Approved/Denied:   PASRR Number:  [id number])  Discharge Plan: SNF    Current Diagnoses: Patient Active Problem List   Diagnosis Date Noted  . Bipolar disorder (HCC) 01/03/2016  . Lithium toxicity 01/02/2016    Orientation RESPIRATION BLADDER Height & Weight     Self, Time, Situation, Place  Normal Continent Weight: 259 lb 4.8 oz (117.618 kg) Height:  5\' 6"  (167.6 cm)  BEHAVIORAL SYMPTOMS/MOOD NEUROLOGICAL BOWEL NUTRITION STATUS   (None)  (None) Continent Diet (Regular)  AMBULATORY STATUS COMMUNICATION OF NEEDS Skin   Extensive Assist Verbally Normal                       Personal Care Assistance Level of Assistance  Dressing, Feeding, Bathing Bathing Assistance: Limited assistance Feeding assistance: Independent Dressing Assistance: Limited assistance     Functional Limitations Info  Sight, Hearing, Speech Sight Info: Adequate Hearing Info: Adequate Speech Info: Adequate    SPECIAL CARE FACTORS FREQUENCY  PT (By licensed PT)     PT Frequency:  (5)              Contractures      Additional Factors Info  Code Status, Allergies, Insulin Sliding Scale Code Status Info:  (Full Code) Allergies Info:  (No Known Allergies)   Insulin Sliding Scale Info:  (insulin aspart (novoLOG) injection 0-9 Units 0-9 Units, Subcutaneous, 3 times daily with meals )       Current Medications (01/04/2016):   This is the current hospital active medication list Current Facility-Administered Medications  Medication Dose Route Frequency Provider Last Rate Last Dose  . amLODipine (NORVASC) tablet 5 mg  5 mg Oral Daily Enid Baasadhika Kalisetti, MD   5 mg at 01/04/16 1017  . atorvastatin (LIPITOR) tablet 80 mg  80 mg Oral QHS Gracelyn NurseJohn D Johnston, MD   80 mg at 01/03/16 2231  . cefTRIAXone (ROCEPHIN) 1 g in dextrose 5 % 50 mL IVPB  1 g Intravenous Q24H Gracelyn NurseJohn D Johnston, MD   1 g at 01/03/16 2232  . glipiZIDE (GLUCOTROL XL) 24 hr tablet 10 mg  10 mg Oral Q breakfast Gracelyn NurseJohn D Johnston, MD   10 mg at 01/04/16 0847  . insulin aspart (novoLOG) injection 0-5 Units  0-5 Units Subcutaneous QHS Enid Baasadhika Kalisetti, MD   0 Units at 01/03/16 2200  . insulin aspart (novoLOG) injection 0-9 Units  0-9 Units Subcutaneous TID WC Enid Baasadhika Kalisetti, MD   2 Units at 01/04/16 1347  . magnesium hydroxide (MILK OF MAGNESIA) suspension 30 mL  30 mL Oral Daily PRN Enid Baasadhika Kalisetti, MD      . OXcarbazepine (TRILEPTAL) tablet 150 mg  150 mg Oral BID Jolanta B Pucilowska, MD   150 mg at 01/04/16 1017  . simethicone (MYLICON) chewable tablet 80 mg  80 mg Oral Q6H PRN Gery Prayebby Crosley, MD   80 mg at 01/03/16 2232  . sodium chloride flush (NS) 0.9 %  injection 3 mL  3 mL Intravenous Q12H Gracelyn NurseJohn D Johnston, MD   3 mL at 01/03/16 2232  . warfarin (COUMADIN) tablet 2 mg  2 mg Oral Once per day on Mon Thu Gracelyn NurseJohn D Johnston, MD      . warfarin (COUMADIN) tablet 5 mg  5 mg Oral q1800 Gracelyn NurseJohn D Johnston, MD   5 mg at 01/03/16 1734     Discharge Medications: Please see discharge summary for a list of discharge medications.  Relevant Imaging Results:  Relevant Lab Results:   Additional Information  (SSN [id number])  Verta Ellenhristina E Sunkins, LCSW

## 2016-01-04 NOTE — Progress Notes (Signed)
SNF and Non-Emergent EMS Transport Benefits:  Number called: [phone] Rep: Charise Carwin Medicare Complete HMO Plan One active as of 07/02/15 with no deductible.  Out of pocket max is $4900, of which $44.18 met so far.  In-network SNF: $0 copay for days 1-20, a $160 daily copay for days 21-51, and a $0 copay for days 52-100.  Once out of pocket is reached, patient covered at 100% for remainder of 100 day benefit period.  $0 copay for professional fees and 3 day hospital stay is not required.  Josem Kaufmann is required: [phone].    Non-emergent EMS transport: $250 copay for each one way medically necessary, Medicare covered trip.  Josem Kaufmann is required: [phone].

## 2016-01-04 NOTE — Progress Notes (Signed)
CSW met with patient and his wife. Per wife they are interested in [facility]. CSW contacted Maudie Mercury- Development worker, international aid at [facility]. She is able to extend a bed offer. CSW was informed that patient will be medically ready to discharge to [facility]. Patient and his wife are in a agreement with plan. CSW called Maudie Mercury- Admissions Coordinator at [facility] to confirm that patient's bed is ready. Provided patient's room number 213 A and number to call for report [phone] . All discharge information faxed to [facility] via [employer]. RN will call report and patient will discharge to [facility] via wife.   Ernest Pine, MSW, [place], [place] Clinical Social Worker [phone]

## 2016-01-04 NOTE — Progress Notes (Signed)
In and out cath per patient request using patient supplies from home, yielding 300 ml of urine.

## 2016-01-04 NOTE — Progress Notes (Signed)
[facility]  ROUNDING NOTE   Subjective:  Patient significantly improved. Lithium now in the normal range. Creatinine down to 1.4. No further tremor. Good urine output noted.   Objective:  Vital signs in last 24 hours:  Temp:  [97.7 F (36.5 C)-98.4 F (36.9 C)] 97.7 F (36.5 C) (07/06 0745) Pulse Rate:  [55-59] 57 (07/06 1017) Resp:  [18] 18 (07/06 0745) BP: (114-124)/(58-62) 122/60 mmHg (07/06 1017) SpO2:  [97 %-100 %] 100 % (07/06 0745)  Weight change:  Filed Weights   01/02/16 1651 01/02/16 2050  Weight: 119 kg (262 lb 5.6 oz) 117.618 kg (259 lb 4.8 oz)    Intake/Output: I/O last 3 completed shifts: In: 2520.2 [P.O.:480; I.V.:1940.2; IV Piggyback:100] Out: 2425 [Urine:2425]   Intake/Output this shift:  Total I/O In: -  Out: 1000 [Urine:1000]  Physical Exam: General: NAD, sitting up in bed  Head: Normocephalic, atraumatic. Moist oral mucosal membranes  Eyes: Anicteric  Neck: Supple, trachea midline  Lungs:  Clear to auscultation normal effort  Heart: Regular rate and rhythm  Abdomen:  Soft, nontender, BS present  Extremities: no peripheral edema.  Neurologic: Awake, alert, no further tremor in hands  Skin: No lesions       Basic Metabolic Panel:  Recent Labs Lab 01/02/16 1730 01/03/16 0336 01/04/16 0350  NA 134* 136 137  K 5.7* 4.7 4.7  CL 109 113* 116*  CO2 20* 20* 19*  GLUCOSE 156* 71 153*  BUN 50* 47* 31*  CREATININE 2.00* 1.77* 1.44*  CALCIUM 9.1 8.3* 8.5*    Liver Function Tests:  Recent Labs Lab 01/02/16 1730  AST 24  ALT 23  ALKPHOS 70  BILITOT 0.7  PROT 7.6  ALBUMIN 4.3   No results for input(s): LIPASE, AMYLASE in the last 168 hours. No results for input(s): AMMONIA in the last 168 hours.  CBC:  Recent Labs Lab 01/02/16 1730  WBC 10.3  NEUTROABS 8.5*  HGB 13.4  HCT 40.5  MCV 87.5  PLT 166    Cardiac Enzymes:  Recent Labs Lab 01/02/16 1730  TROPONINI <0.03    BNP: Invalid input(s):  POCBNP  CBG:  Recent Labs Lab 01/03/16 1159 01/03/16 1721 01/03/16 2057 01/04/16 0804 01/04/16 1146  GLUCAP 108* 148* 127* 114* 178*    Microbiology: No results found for this or any previous visit.  Coagulation Studies:  Recent Labs  01/02/16 1730 01/03/16 0336  LABPROT 20.8* 23.0*  INR 1.79 2.05    Urinalysis:  Recent Labs  01/02/16 1730  COLORURINE YELLOW*  LABSPEC 1.015  PHURINE 5.0  GLUCOSEU NEGATIVE  HGBUR 2+*  BILIRUBINUR NEGATIVE  KETONESUR NEGATIVE  PROTEINUR 30*  NITRITE NEGATIVE  LEUKOCYTESUR 3+*      Imaging: Ct Head Wo Contrast  01/02/2016  CLINICAL DATA:  Two day history of right lower extremity weakness EXAM: CT HEAD WITHOUT CONTRAST TECHNIQUE: Contiguous axial images were obtained from the base of the skull through the vertex without intravenous contrast. COMPARISON:  None. FINDINGS: Brain: The ventricles are normal in size and configuration. There is no intracranial mass, hemorrhage, extra-axial fluid collection, or midline shift. Gray-white compartments appear unremarkable. No acute infarct evident. Vascular: There are a few scattered atherosclerotic calcifications in the distal internal carotid arteries bilaterally. Skull: Bony calvarium appears intact. Sinuses/Orbits: Orbits appear symmetric bilaterally. Visualized paranasal sinuses clear. Other: Mastoid air cells are clear. IMPRESSION: No intracranial mass, hemorrhage, or focal gray -white compartment lesions/ acute appearing infarct. Occasional atherosclerotic vascular calcifications noted. Electronically Signed   By:  Bretta BangWilliam  Woodruff III M.D.   On: 01/02/2016 17:17   Mr Brain Wo Contrast  01/03/2016  CLINICAL DATA:  60 year old male with fall and right lower extremity weakness such that he was unable to get up. Was tracking the right leg. Initial encounter. EXAM: MRI HEAD WITHOUT CONTRAST TECHNIQUE: Multiplanar, multiecho pulse sequences of the brain and surrounding structures were obtained  without intravenous contrast. COMPARISON:  Noncontrast head CT 01/02/2016. FINDINGS: Cerebral volume is within normal limits for age. No restricted diffusion to suggest acute infarction. No midline shift, mass effect, evidence of mass lesion, ventriculomegaly, extra-axial collection or acute intracranial hemorrhage. Cervicomedullary junction and pituitary are within normal limits. Major intracranial vascular flow voids are preserved. Wallace CullensGray and white matter signal is within normal limits for age throughout the brain. No encephalomalacia or chronic cerebral blood products identified. Grossly normal visualized internal auditory structures. Paranasal sinuses and mastoids are clear. Negative orbit and scalp soft tissues. Visualized bone marrow signal is within normal limits. There is evidence of upper cervical spine disc degeneration which may result in mild spinal stenosis at C2-C3 as seen on series 2, image 16. IMPRESSION: 1. No acute intracranial abnormality. Normal for age noncontrast MRI appearance of the brain. 2. Possible mild degenerative spinal stenosis in the visible upper cervical spine. Electronically Signed   By: Odessa FlemingH  Hall M.D.   On: 01/03/2016 14:09   [place] Renal  01/03/2016  CLINICAL DATA:  Acute renal failure, history of hypertension and diabetes EXAM: RENAL / URINARY TRACT ULTRASOUND COMPLETE COMPARISON:  None in PACs FINDINGS: Right Kidney: Length: 11.7 cm. The renal cortical echotexture remains lower than that of the adjacent liver. There is no parenchymal mass nor hydronephrosis. No stones are evident. Left Kidney: Length: 11.8 cm. The renal cortical echotexture is normal similar to that on the right. There is no hydronephrosis, focal mass, or stone. Bladder: Appears normal for degree of bladder distention. IMPRESSION: Normal renal ultrasound examination.  No hydronephrosis. Electronically Signed   By: David  SwazilandJordan M.D.   On: 01/03/2016 17:01   [place] Carotid Bilateral  01/03/2016  CLINICAL DATA:  TIA.   Hypertension, hyperlipidemia, diabetes. EXAM: BILATERAL CAROTID DUPLEX ULTRASOUND TECHNIQUE: Wallace CullensGray scale imaging, color Doppler and duplex ultrasound was performed of bilateral carotid and vertebral arteries in the neck. COMPARISON:  None. TECHNIQUE: Quantification of carotid stenosis is based on velocity parameters that correlate the residual internal carotid diameter with NASCET-based stenosis levels, using the diameter of the distal internal carotid lumen as the denominator for stenosis measurement. The following velocity measurements were obtained: PEAK SYSTOLIC/END DIASTOLIC RIGHT ICA:                     83/21cm/sec CCA:                     133/13cm/sec SYSTOLIC ICA/CCA RATIO:  0.6 DIASTOLIC ICA/CCA RATIO: 1.7 ECA:                     122cm/sec LEFT ICA:                     62/18cm/sec CCA:                     117/17cm/sec SYSTOLIC ICA/CCA RATIO:  0.5 DIASTOLIC ICA/CCA RATIO: 1.1 ECA:                     114cm/sec FINDINGS: RIGHT CAROTID ARTERY: No plaque or stenosis. Normal  waveforms and color Doppler signal. RIGHT VERTEBRAL ARTERY:  Normal flow direction and waveform. LEFT CAROTID ARTERY: Mild eccentric noncalcified plaque in the carotid bulb. No high-grade stenosis. Normal waveforms and color Doppler signal. LEFT VERTEBRAL ARTERY: Normal flow direction and waveform. IMPRESSION: 1. Mild left carotid bifurcation plaque resulting in less than 50% diameter stenosis. The exam does not exclude plaque ulceration or embolization. Continued surveillance recommended. 2.  Antegrade bilateral vertebral arterial flow. Electronically Signed   By: Corlis Leak  Hassell M.D.   On: 01/03/2016 15:11     Medications:     . amLODipine  5 mg Oral Daily  . atorvastatin  80 mg Oral QHS  . cefTRIAXone (ROCEPHIN)  IV  1 g Intravenous Q24H  . glipiZIDE  10 mg Oral Q breakfast  . insulin aspart  0-5 Units Subcutaneous QHS  . insulin aspart  0-9 Units Subcutaneous TID WC  . OXcarbazepine  150 mg Oral BID  . sodium chloride flush  3  mL Intravenous Q12H  . warfarin  2 mg Oral Once per day on Mon Thu  . warfarin  5 mg Oral q1800   magnesium hydroxide, simethicone  Assessment/ Plan:  60 y.o. male with a PMHx of Hypertension, diabetes mellitus type 2, history of bipolar disorder, traumatic brain injury, history of PE, history of DVT, who [facility] for evaluation of weakness status post fall. The patient has known history of bipolar disorder and is on lithium carbonate for this.  1. Acute renal failure. 2. Lithium toxicity. 3. Hyperkalemia, improved. 4. Urinary tract infection. 5. Hypertension.  Plan:  Creatinine is now down to 1.4 and lithium level is in the normal range.  His tremors have resolved.  I advised the patient to maintain adequate fluid hydration status while at home.  He verbalized understanding of this.  Hyperkalemia has also corrected and potassium is now 4.7.  Further management of hispsychotropic medications per psychiatry.  Continue amlodipine for blood pressure control.  Okay for discharge from our perspective.  We recommend continued monitoring of renal function as an outpatient however.   LOS: 2 LATEEF, MUNSOOR 7/6/20174:16 PM

## 2016-01-04 NOTE — Care Management Obs Status (Signed)
MEDICARE OBSERVATION STATUS NOTIFICATION   Patient Details  Name: [name] MaffucciJoseph Mahaffy MRN: [id number] Date of Birth: 14-Oct-1955   Medicare Observation Status Notification Given:  Yes   Explained the medicare code [id number] to patient's wife and relayed that this was initiated by patient's insurance company    Eber HongGreene, Nann R, CaliforniaRN 01/04/2016, 12:02 PM

## 2016-01-04 NOTE — Clinical Social Work Note (Signed)
Clinical Social Work Assessment  Patient Details  Name: [name] MRN: [id number] Date of Birth: 05/01/1956  Date of referral:  01/04/16               Reason for consult:                   Permission sought to share information with:  Family Supports Permission granted to share information::  Yes, Verbal Permission Granted  Name::        Agency::     Relationship::   ([name]- Wife)  Contact Information:     Housing/Transportation Living arrangements for the past 2 months:  [place] of Information:  Patient Patient Interpreter Needed:  None Criminal Activity/Legal Involvement Pertinent to Current Situation/Hospitalization:  No - Comment as needed Significant Relationships:  Spouse Lives with:  Spouse Do you feel safe going back to the place where you live?  Yes Need for family participation in patient care:  Yes (Comment) ([name]- Wife)  Care giving concerns:  Patient and his wife are interested in SNF placement. Patient is read for discharge today.    Social Worker assessment / plan:  CSW met with patient at bedside. Introduced herself and her role. Patient reports that he's agreeable for SNF. Requested CSW to come back and talk to him when his wife arrives at [facility]. Granted CSW verbal permission to send SNF referral to SNFs in [place]. FL2/ PASRR completed and faxed to SNFs in [facility]. Awaiting bed offers. CSW will continue to follow and assist.   Employment status:  Retired Insurance underwriter information:  Medicare PT Recommendations:  [place] / Referral to community resources:  [place]  Patient/Family's Response to care:  Patient is agreeable to discharge SNF today 01/04/16.  Patient/Family's Understanding of and Emotional Response to Diagnosis, Current Treatment, and Prognosis:  Patient understands his diagnosis and prognosis. Was appreciative of CSW's assistance.   Emotional  Assessment Appearance:  Appears stated age Attitude/Demeanor/Rapport:   (None) Affect (typically observed):  Accepting, Calm, Pleasant Orientation:  Oriented to Self, Oriented to Place, Oriented to  Time, Oriented to Situation Alcohol / Substance use:  Not Applicable Psych involvement (Current and /or in the community):  No (Comment)  Discharge Needs  Concerns to be addressed:  Discharge Planning Concerns Readmission within the last 30 days:  No Current discharge risk:  None Barriers to Discharge:      Baldemar Lenis, LCSW 01/04/2016, 3:02 PM

## 2016-01-04 NOTE — Progress Notes (Signed)
Physical Therapy Treatment Patient Details Name: [name] MaffucciJoseph [name] MRN: [id number] DOB: September 05, 1955 Today's Date: 01/04/2016    History of Present Illness Pt is a pleasant 60 y/o male with history of TBI and mild R sided weakness, on lithium to manage bipolar disorder, presenting with RLE "weakness". Patient has been noted to have been dragging his RLE at home, he sustained a fall and waited ~1 hour for wife to return to assist in standing. He was noted to have AKI and possible lithium toxicity.     PT Comments    Pt in bed ready for session.  Participated in exercises as described below.  During exercises wife stated pt was moving his RLE much better today.  Transitioned to edge of bed with mod a x 1.  He had difficulty getting his upper body up off the bed and obtaining his balance once sitting.  Standing at edge of bed with mod a x 2.  He ambulated 5110' with very poor gait quality and mod a x 2 to chair.  Poor steps, narrow base of support and poor posture.  Pt taken to hallway where gait quality remained poor but was improved on 4th attempt requiring mod a x 1 and +1 for wheelchair follow but he remains at high fall risk due to poor step pattern, posture, safety awareness and poor self-assessment of abilities.  Wife in attendance and encouraged patient.  Pt does use a rollator walker at home which may be affecting his gait quality and she was encouraged to bring it so we could trial it next session but due to quality of ambulation today and rollator walkers being less supportive and steady this may not be a good option for him at this time.   While pt demonstrated improved AROM of RLE for exercises per wife, his overall gait and transfer quality seemed to decline.  Pt and wife in agreement that he is not at his baseline and he would have difficulty at home with a very high risk of falls/injury.  Pt states he wants to go to SNF for rehab to increase his functional independence.   Follow Up  Recommendations  SNF     Equipment Recommendations  Rolling walker with 5" wheels    Recommendations for Other Services       Precautions / Restrictions Precautions Precautions: Fall Restrictions Weight Bearing Restrictions: No    Mobility  Bed Mobility Overal bed mobility: Needs Assistance Bed Mobility: Supine to Sit mod assist x 1       General bed mobility comments: bed mobility difficult with rail.  Struggles to obtain upright sitting.  Transfers Overall transfer level: Needs assistance Equipment used: Rolling walker (2 wheeled) Transfers: Sit to/from Stand Sit to Stand: Min assist;Mod assist;+2 physical assistance;+2 safety/equipment         General transfer comment: poor foot placement - verbal and tactile cues to place feet in proper position.  tries to stand with rle exteded in front of him in air.  Ambulation/Gait Ambulation/Gait assistance: Mod assist;+2 physical assistance;+2 safety/equipment Ambulation Distance (Feet): 10 Feet (15, 10, 15) Assistive device: Rolling walker (2 wheeled) Gait Pattern/deviations: Step-to pattern;Narrow base of support;Antalgic   Gait velocity interpretation: <1.8 ft/sec, indicative of risk for recurrent falls General Gait Details: Very poor gait initially which improved slightly with subsequent attempts but reamins high fall risk and very unsafe.     Stairs            Wheelchair Mobility    Modified Rankin (Stroke  Patients Only)       Balance Overall balance assessment: Needs assistance Sitting-balance support: Feet supported Sitting balance-Leahy Scale: Fair     Standing balance support: Bilateral upper extremity supported Standing balance-Leahy Scale: Poor                      Cognition Arousal/Alertness: Awake/alert Behavior During Therapy: WFL for tasks assessed/performed Overall Cognitive Status: Within Functional Limits for tasks assessed                      Exercises       General Comments        Pertinent Vitals/Pain Pain Assessment: No/denies pain    Home Living                      Prior Function            PT Goals (current goals can now be found in the care plan section) Progress towards PT goals: Progressing toward goals    Frequency  Min 2X/week    PT Plan Current plan remains appropriate    Co-evaluation             End of Session Equipment Utilized During Treatment: Gait belt Activity Tolerance: Patient limited by fatigue Patient left: in chair;with call bell/phone within reach;with chair alarm set;with family/visitor present;with nursing/sitter in room     Time: [id number] PT Time Calculation (min) (ACUTE ONLY): 31 min  Charges:  $Gait Training: 8-22 mins $Therapeutic Exercise: 8-22 mins                    G Codes:      Danielle DessSarah Congdon, PTA 01/04/2016, 1:24 PM

## 2016-01-04 NOTE — Clinical Social Work Placement (Signed)
   CLINICAL SOCIAL WORK PLACEMENT  NOTE  Date:  01/04/2016  Patient Details  Name: [name] MRN: [id number] Date of Birth: 1955-08-16  Clinical Social Work is seeking post-discharge placement for this patient at the Skilled  Nursing Facility level of care (*CSW will initial, date and re-position this form in  chart as items are completed):  Yes   Patient/family provided with [place] Clinical Social Work Department's list of facilities offering this level of care within the geographic area requested by the patient (or if unable, by the patient's family).  Yes   Patient/family informed of their freedom to choose among providers that offer the needed level of care, that participate in Medicare, Medicaid or managed care program needed by the patient, have an available bed and are willing to accept the patient.  Yes   Patient/family informed of [facility]'s ownership interest in [facility] and [facility], as well as of the fact that they are under no obligation to receive care at these facilities.  PASRR submitted to EDS on 01/04/16     PASRR number received on 01/04/16     Existing PASRR number confirmed on       FL2 transmitted to all facilities in geographic area requested by pt/family on 01/04/16     FL2 transmitted to all facilities within larger geographic area on       Patient informed that his/her managed care company has contracts with or will negotiate with certain facilities, including the following:        Yes   Patient/family informed of bed offers received.  Patient chooses bed at  [facility]     Physician recommends and patient chooses bed at      Patient to be transferred to  ([facility]) on 01/04/16.  Patient to be transferred to facility by  (Wife)     Patient family notified on 01/04/16 of transfer.  Name of family member notified:   (Wife)     PHYSICIAN       Additional Comment:     _______________________________________________ Idamae Lusherhristina E Sunkins, LCSW 01/04/2016, 4:08 PM

## 2016-01-04 NOTE — Care Management (Signed)
Physical therapy again recommends skilled nursing and wife is present during the evaluation.  Both in agreement.  Updated csw.

## 2016-01-05 DIAGNOSIS — I82409 Acute embolism and thrombosis of unspecified deep veins of unspecified lower extremity: Secondary | ICD-10-CM | POA: Diagnosis not present

## 2016-01-05 DIAGNOSIS — Z7901 Long term (current) use of anticoagulants: Secondary | ICD-10-CM | POA: Diagnosis not present

## 2016-01-06 LAB — GLUCOSE, CAPILLARY
Glucose-Capillary: 84 mg/dL (ref 65–99)
Glucose-Capillary: 148 mg/dL — ABNORMAL HIGH (ref 65–99)
Glucose-Capillary: 121 mg/dL — ABNORMAL HIGH (ref 65–99)
Glucose-Capillary: 123 mg/dL — ABNORMAL HIGH (ref 65–99)
Glucose-Capillary: 66 mg/dL (ref 65–99)
Glucose-Capillary: 75 mg/dL (ref 65–99)
Glucose-Capillary: 113 mg/dL — ABNORMAL HIGH (ref 65–99)
Glucose-Capillary: 87 mg/dL (ref 65–99)
Glucose-Capillary: 116 mg/dL — ABNORMAL HIGH (ref 65–99)

## 2016-01-07 DIAGNOSIS — I82409 Acute embolism and thrombosis of unspecified deep veins of unspecified lower extremity: Secondary | ICD-10-CM | POA: Diagnosis not present

## 2016-01-07 LAB — GLUCOSE, CAPILLARY
Glucose-Capillary: 109 mg/dL — ABNORMAL HIGH (ref 65–99)
Glucose-Capillary: 113 mg/dL — ABNORMAL HIGH (ref 65–99)
Glucose-Capillary: 120 mg/dL — ABNORMAL HIGH (ref 65–99)
Glucose-Capillary: 153 mg/dL — ABNORMAL HIGH (ref 65–99)

## 2016-01-07 LAB — PROTIME-INR
INR: 1.83
Prothrombin Time: 21.1 seconds — ABNORMAL HIGH (ref 11.4–15.0)

## 2016-01-08 DIAGNOSIS — I82409 Acute embolism and thrombosis of unspecified deep veins of unspecified lower extremity: Secondary | ICD-10-CM | POA: Diagnosis not present

## 2016-01-08 LAB — GLUCOSE, CAPILLARY
Glucose-Capillary: 134 mg/dL — ABNORMAL HIGH (ref 65–99)
Glucose-Capillary: 160 mg/dL — ABNORMAL HIGH (ref 65–99)
Glucose-Capillary: 106 mg/dL — ABNORMAL HIGH (ref 65–99)
Glucose-Capillary: 147 mg/dL — ABNORMAL HIGH (ref 65–99)

## 2016-01-09 DIAGNOSIS — I82409 Acute embolism and thrombosis of unspecified deep veins of unspecified lower extremity: Secondary | ICD-10-CM | POA: Diagnosis not present

## 2016-01-09 LAB — CBC WITH DIFFERENTIAL/PLATELET
Basophils Absolute: 0 10*3/uL (ref 0–0.1)
Basophils Relative: 1 %
Eosinophils Absolute: 0.2 10*3/uL (ref 0–0.7)
Eosinophils Relative: 4 %
HCT: 39.2 % — ABNORMAL LOW (ref 40.0–52.0)
Hemoglobin: 12.4 g/dL — ABNORMAL LOW (ref 13.0–18.0)
Lymphocytes Relative: 24 %
Lymphs Abs: 1.7 10*3/uL (ref 1.0–3.6)
MCH: 28.4 pg (ref 26.0–34.0)
MCHC: 31.8 g/dL — ABNORMAL LOW (ref 32.0–36.0)
MCV: 89.4 fL (ref 80.0–100.0)
Monocytes Absolute: 0.4 10*3/uL (ref 0.2–1.0)
Monocytes Relative: 5 %
Neutro Abs: 4.5 10*3/uL (ref 1.4–6.5)
Neutrophils Relative %: 66 %
Platelets: 168 10*3/uL (ref 150–440)
RBC: 4.38 MIL/uL — ABNORMAL LOW (ref 4.40–5.90)
RDW: 17.4 % — ABNORMAL HIGH (ref 11.5–14.5)
WBC: 6.8 10*3/uL (ref 3.8–10.6)

## 2016-01-09 LAB — COMPREHENSIVE METABOLIC PANEL
ALT: 30 U/L (ref 17–63)
AST: 24 U/L (ref 15–41)
Albumin: 3.6 g/dL (ref 3.5–5.0)
Alkaline Phosphatase: 79 U/L (ref 38–126)
Anion gap: 7 (ref 5–15)
BUN: 27 mg/dL — ABNORMAL HIGH (ref 6–20)
CO2: 19 mmol/L — ABNORMAL LOW (ref 22–32)
Calcium: 8.4 mg/dL — ABNORMAL LOW (ref 8.9–10.3)
Chloride: 109 mmol/L (ref 101–111)
Creatinine, Ser: 1.27 mg/dL — ABNORMAL HIGH (ref 0.61–1.24)
GFR calc Af Amer: >60 mL/min (ref >60)
GFR calc non Af Amer: 60 mL/min — ABNORMAL LOW (ref >60)
Glucose, Bld: 168 mg/dL — ABNORMAL HIGH (ref 65–99)
Potassium: 4.2 mmol/L (ref 3.5–5.1)
Sodium: 135 mmol/L (ref 135–145)
Total Bilirubin: 0.6 mg/dL (ref 0.3–1.2)
Total Protein: 6.8 g/dL (ref 6.5–8.1)

## 2016-01-09 LAB — PROTIME-INR
INR: 1.32
Prothrombin Time: 16.5 seconds — ABNORMAL HIGH (ref 11.4–15.0)

## 2016-01-09 LAB — GLUCOSE, CAPILLARY: Glucose-Capillary: 70 mg/dL (ref 65–99)

## 2016-01-11 DIAGNOSIS — I82409 Acute embolism and thrombosis of unspecified deep veins of unspecified lower extremity: Secondary | ICD-10-CM | POA: Diagnosis not present

## 2016-01-11 LAB — PROTIME-INR
INR: 1.73
Prothrombin Time: 20.2 seconds — ABNORMAL HIGH (ref 11.4–15.0)

## 2016-01-16 DIAGNOSIS — I82409 Acute embolism and thrombosis of unspecified deep veins of unspecified lower extremity: Secondary | ICD-10-CM | POA: Diagnosis not present

## 2016-01-16 LAB — PROTIME-INR
INR: 1.18
Prothrombin Time: 15.2 seconds — ABNORMAL HIGH (ref 11.4–15.0)

## 2016-01-17 NOTE — Progress Notes (Signed)
   01/03/16 1019  PT Time Calculation  PT Start Time (ACUTE ONLY) 0931  PT Stop Time (ACUTE ONLY) 0954  PT Time Calculation (min) (ACUTE ONLY) 23 min  PT G-Codes **NOT FOR INPATIENT CLASS**  Functional Assessment Tool Used (Clinical judgement)  Functional Limitation Mobility: Walking and moving around  Mobility: Walking and Moving Around Current Status (Z6109(G8978) CJ  Mobility: Walking and Moving Around Goal Status (U0454(G8979) CJ  PT General Charges  $$ ACUTE PT VISIT 1 Procedure  PT Evaluation  $PT Eval Moderate Complexity 1 Procedure   Late entry G-codes:  Kerin RansomPatrick A McNamara, PT, DPT

## 2016-01-19 DIAGNOSIS — I82409 Acute embolism and thrombosis of unspecified deep veins of unspecified lower extremity: Secondary | ICD-10-CM | POA: Diagnosis not present

## 2016-01-19 LAB — PROTIME-INR
INR: 1.29
Prothrombin Time: 16.2 seconds — ABNORMAL HIGH (ref 11.4–15.0)

## 2016-01-22 DIAGNOSIS — I82409 Acute embolism and thrombosis of unspecified deep veins of unspecified lower extremity: Secondary | ICD-10-CM | POA: Diagnosis not present

## 2016-01-22 LAB — PROTIME-INR
INR: 1.22
Prothrombin Time: 15.6 seconds — ABNORMAL HIGH (ref 11.4–15.0)

## 2016-03-06 ENCOUNTER — Ambulatory Visit (INDEPENDENT_AMBULATORY_CARE_PROVIDER_SITE_OTHER): Payer: 59 | Admitting: Psychiatry

## 2016-03-06 ENCOUNTER — Encounter: Payer: Self-pay | Admitting: Psychiatry

## 2016-03-06 VITALS — BP 133/70 | HR 82 | Temp 97.7°F | Ht 66.0 in | Wt 258.0 lb

## 2016-03-06 DIAGNOSIS — F319 Bipolar disorder, unspecified: Secondary | ICD-10-CM

## 2016-03-06 DIAGNOSIS — I82409 Acute embolism and thrombosis of unspecified deep veins of unspecified lower extremity: Secondary | ICD-10-CM | POA: Insufficient documentation

## 2016-03-06 MED ORDER — OXCARBAZEPINE 150 MG PO TABS: 150.0000 mg | ORAL_TABLET | Freq: Every day | ORAL | 2 refills | Status: DC
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
## 2016-06-05 ENCOUNTER — Encounter: Payer: Self-pay | Admitting: Psychiatry

## 2016-06-05 ENCOUNTER — Ambulatory Visit (INDEPENDENT_AMBULATORY_CARE_PROVIDER_SITE_OTHER): Payer: 59 | Admitting: Psychiatry

## 2016-06-05 VITALS — BP 135/67 | HR 72 | Temp 97.5°F

## 2016-06-05 DIAGNOSIS — F319 Bipolar disorder, unspecified: Secondary | ICD-10-CM | POA: Diagnosis not present

## 2016-06-05 MED ORDER — OXCARBAZEPINE 150 MG PO TABS: 150.0000 mg | ORAL_TABLET | Freq: Every day | ORAL | 2 refills | Status: DC
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
## 2016-09-03 ENCOUNTER — Ambulatory Visit (INDEPENDENT_AMBULATORY_CARE_PROVIDER_SITE_OTHER): Payer: 59 | Admitting: Psychiatry

## 2016-09-03 ENCOUNTER — Encounter: Payer: Self-pay | Admitting: Psychiatry

## 2016-09-03 VITALS — BP 172/89 | HR 94 | Temp 97.8°F

## 2016-09-03 DIAGNOSIS — F319 Bipolar disorder, unspecified: Secondary | ICD-10-CM | POA: Diagnosis not present

## 2016-09-03 MED ORDER — OXCARBAZEPINE 150 MG PO TABS: 150.0000 mg | ORAL_TABLET | Freq: Every day | ORAL | 5 refills | Status: DC

## 2016-09-03 NOTE — Progress Notes (Signed)
Psychiatric address note  Patient Identification: [name] MaffucciJoseph [name] MRN:  [id number] Date of Evaluation:  09/03/2016 Referral Source:  Self referred Chief Complaint:   Chief Complaint    Follow-up; Medication Refill     Visit Diagnosis:    ICD-9-CM ICD-10-CM   1. Bipolar I disorder (HCC) 296.7 F31.9     History of Present Illness:  Patient is a 61 year old African-American male who was seen Today For a follow-up of bipolar disorder. Patient reports that he has been doing quite well. States that he has been losing weight and going to the gym regularly. States that he is also avoiding fatty foods and eating better. He's had a granddaughter in December and is excited about that. Denies any mood symptoms. Fair sleep and appetite. Denies any suicidal thoughts. He has not obtained a Trileptal level. Past Psychiatric History: [facility] in [facility]  Previous Psychotropic Medications: Yes   Substance Abuse History in the last 12 months:  No.  Consequences of Substance Abuse: Negative  Past Medical History:  Past Medical History:  Diagnosis Date  . Bipolar disorder (HCC)   . Diabetes mellitus without complication (HCC)   . History of blood clots   . Hypertension     Past Surgical History:  Procedure Laterality Date  . BACK SURGERY    . CARPAL TUNNEL RELEASE Bilateral   . TONSILLECTOMY      Family Psychiatric History: Father and grandmother had depression.  Family History:  Family History  Problem Relation Age of Onset  . Depression Father     Social History:   Social History   Social History  . Marital status: Married    Spouse name: N/A  . Number of children: N/A  . Years of education: N/A   Social History Main Topics  . Smoking status: Never Smoker  . Smokeless tobacco: Never Used  . Alcohol use Yes     Comment: social  . Drug use: No  . Sexual activity: Not Asked   Other Topics Concern  . None   Social History  Narrative  . None    Additional Social History: Patient lives with his wife.  Allergies:  No Known Allergies  Metabolic Disorder Labs: Lab Results  Component Value Date   HGBA1C 7.0 (H) 01/03/2016   No results found for: PROLACTIN No results found for: CHOL, TRIG, HDL, CHOLHDL, VLDL, LDLCALC   Current Medications: Current Outpatient Prescriptions  Medication Sig Dispense Refill  . amLODipine (NORVASC) 10 MG tablet     . amLODipine (NORVASC) 5 MG tablet Take 1 tablet (5 mg total) by mouth daily. 30 tablet 2  . atorvastatin (LIPITOR) 80 MG tablet Take 80 mg by mouth at bedtime.    . cephALEXin (KEFLEX) 250 MG capsule Take 1 capsule (250 mg total) by mouth 3 (three) times daily. X 4 more days 12 capsule 0  . glipiZIDE (GLUCOTROL XL) 10 MG 24 hr tablet Take 10 mg by mouth daily with breakfast.     . metFORMIN (GLUCOPHAGE-XR) 500 MG 24 hr tablet Take 1,000 mg by mouth at bedtime.     . OXcarbazepine (TRILEPTAL) 150 MG tablet Take 1 tablet (150 mg total) by mouth daily. 30 tablet 2  . spironolactone (ALDACTONE) 100 MG tablet     . valsartan-hydrochlorothiazide (DIOVAN-HCT) 320-25 MG tablet     . warfarin (COUMADIN) 1 MG tablet Take 1 mg by mouth See admin instructions. Take 2 tablets (2 mg) on Monday and Thursday only along with  5 mg tablet to equal 7 mg total.    . warfarin (COUMADIN) 5 MG tablet Take 5 mg by mouth daily. *Take along with 2 tablets of 1 mg to equal 7 mg on Monday and Thursday only*     No current facility-administered medications for this visit.     Neurologic: Headache: No Seizure: No Paresthesias:No  Musculoskeletal: Strength & Muscle Tone: reduced in both lower limbs Gait & Station: shuffling Patient leans: broad based gait  Psychiatric Specialty Exam: ROS  Blood pressure (!) 172/89, pulse 94, temperature 97.8 F (36.6 C), temperature source Oral.There is no height or weight on file to calculate BMI.  General Appearance: Casual  Eye Contact:  Fair   Speech:  Clear and Coherent  Volume:  Normal  Mood:  Euthymic  Affect:  Congruent  Thought Process:  Coherent  Orientation:  Full (Time, Place, and Person)  Thought Content:  WDL  Suicidal Thoughts:  No  Homicidal Thoughts:  No  Memory:  Immediate;   Fair Recent;   Fair Remote;   Fair  Judgement:  Fair  Insight:  Present  Psychomotor Activity:  Decreased  Concentration:  Concentration: Fair and Attention Span: Fair  Recall:  [employer] of Knowledge:Fair  Language: Fair  Akathisia:  No  Handed:  Right  AIMS (if indicated):    Assets:  Communication Skills Desire for Improvement Housing Resilience Social Support  ADL's:  Intact  Cognition: WNL  Sleep:  good    Treatment Plan Summary:  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 Patient recommended to get his Trileptal level done. We discussed that patient could also follow-up for his bipolar disorder by his primary care physician. He has been quite stable on the Trileptal with no changes.  Return to clinic in 6 months or call before if necessary  Patrick North, MD 3/6/20189:08 AM

## 2017-03-11 ENCOUNTER — Ambulatory Visit (INDEPENDENT_AMBULATORY_CARE_PROVIDER_SITE_OTHER): Payer: Medicare Other | Admitting: Psychiatry

## 2017-03-11 ENCOUNTER — Encounter: Payer: Self-pay | Admitting: Psychiatry

## 2017-03-11 VITALS — BP 130/70 | HR 98 | Temp 98.5°F

## 2017-03-11 DIAGNOSIS — F319 Bipolar disorder, unspecified: Secondary | ICD-10-CM

## 2017-03-11 MED ORDER — OXCARBAZEPINE 150 MG PO TABS: 150.0000 mg | ORAL_TABLET | Freq: Every day | ORAL | 5 refills | Status: DC

## 2017-03-11 NOTE — Progress Notes (Signed)
Psychiatric address note  Patient Identification: [name] MaffucciJoseph [name] MRN:  [id number] Date of Evaluation:  03/11/2017 Referral Source:  Self referred Chief Complaint:  Doing well  Visit Diagnosis:    ICD-10-CM   1. Bipolar I disorder (HCC) F31.9     History of Present Illness:  Patient is a 61 year old African-American male who was seen Today For a follow-up of bipolar disorder. Patient reports that he has been doing quite well. Reports he is compliant with the trileptal. He reports that he forgot to obtain the level of his Trileptal. States that he has a physical coming up next month and the get his labs done. During some time with his granddaughter. Denies any other complaints.   Past Psychiatric History: [facility]  Previous Psychotropic Medications: Yes   Substance Abuse History in the last 12 months:  No.  Consequences of Substance Abuse: Negative  Past Medical History:  Past Medical History:  Diagnosis Date  . Bipolar disorder (HCC)   . Diabetes mellitus without complication (HCC)   . History of blood clots   . Hypertension     Past Surgical History:  Procedure Laterality Date  . BACK SURGERY    . CARPAL TUNNEL RELEASE Bilateral   . TONSILLECTOMY      Family Psychiatric History: Father and grandmother had depression.  Family History:  Family History  Problem Relation Age of Onset  . Depression Father     Social History:   Social History   Social History  . Marital status: Married    Spouse name: N/A  . Number of children: N/A  . Years of education: N/A   Social History Main Topics  . Smoking status: Never Smoker  . Smokeless tobacco: Never Used  . Alcohol use Yes     Comment: social  . Drug use: No  . Sexual activity: Not on file   Other Topics Concern  . Not on file   Social History Narrative  . No narrative on file    Additional Social History: Patient lives with his wife.  Allergies:   No Known Allergies  Metabolic Disorder Labs: Lab Results  Component Value Date   HGBA1C 7.0 (H) 01/03/2016   No results found for: PROLACTIN No results found for: CHOL, TRIG, HDL, CHOLHDL, VLDL, LDLCALC   Current Medications: Current Outpatient Prescriptions  Medication Sig Dispense Refill  . amLODipine (NORVASC) 10 MG tablet     . amLODipine (NORVASC) 5 MG tablet Take 1 tablet (5 mg total) by mouth daily. 30 tablet 2  . atorvastatin (LIPITOR) 80 MG tablet Take 80 mg by mouth at bedtime.    . cephALEXin (KEFLEX) 250 MG capsule Take 1 capsule (250 mg total) by mouth 3 (three) times daily. X 4 more days 12 capsule 0  . glipiZIDE (GLUCOTROL XL) 10 MG 24 hr tablet Take 10 mg by mouth daily with breakfast.     . metFORMIN (GLUCOPHAGE-XR) 500 MG 24 hr tablet Take 1,000 mg by mouth at bedtime.     . OXcarbazepine (TRILEPTAL) 150 MG tablet Take 1 tablet (150 mg total) by mouth daily. 30 tablet 5  . spironolactone (ALDACTONE) 100 MG tablet     . valsartan-hydrochlorothiazide (DIOVAN-HCT) 320-25 MG tablet     . warfarin (COUMADIN) 1 MG tablet Take 1 mg by mouth See admin instructions. Take 2 tablets (2 mg) on Monday and Thursday only along with 5 mg tablet to equal 7 mg total.    . warfarin (  COUMADIN) 5 MG tablet Take 5 mg by mouth daily. *Take along with 2 tablets of 1 mg to equal 7 mg on Monday and Thursday only*     No current facility-administered medications for this visit.     Neurologic: Headache: No Seizure: No Paresthesias:No  Musculoskeletal: Strength & Muscle Tone: reduced in both lower limbs Gait & Station: shuffling Patient leans: broad based gait  Psychiatric Specialty Exam: ROS  There were no vitals taken for this visit.There is no height or weight on file to calculate BMI.  General Appearance: Casual  Eye Contact:  Fair  Speech:  Clear and Coherent  Volume:  Normal  Mood:  Euthymic  Affect:  Congruent  Thought Process:  Coherent  Orientation:  Full (Time, Place,  and Person)  Thought Content:  WDL  Suicidal Thoughts:  No  Homicidal Thoughts:  No  Memory:  Immediate;   Fair Recent;   Fair Remote;   Fair  Judgement:  Fair  Insight:  Present  Psychomotor Activity:  Decreased  Concentration:  Concentration: Fair and Attention Span: Fair  Recall:  [employer] of Knowledge:Fair  Language: Fair  Akathisia:  No  Handed:  Right  AIMS (if indicated):    Assets:  Communication Skills Desire for Improvement Housing Resilience Social Support  ADL's:  Intact  Cognition: WNL  Sleep:  good    Treatment Plan Summary:  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 Patient recommended to get his Trileptal level done. We discussed that patient will follow up with Dr.Eappen who is an adult psychiatrist starting in October at this clinic.Marland Kitchen  Return to clinic in 4 months or call before if necessary  Patrick North, MD 9/11/20189:06 AM

## 2017-05-28 IMAGING — MR MR HEAD W/O CM
10 series · 41 of 48 positions shown · non-contrast
Comparison: Noncontrast head CT 01/02/2016.

CLINICAL DATA: 59-year-old male with fall and right lower extremity
weakness such that he was unable to get up. Was tracking the right
leg. Initial encounter.

EXAM:
MRI HEAD WITHOUT CONTRAST
TECHNIQUE: Multiplanar, multiecho pulse sequences of the brain and surrounding
structures were obtained without intravenous contrast.

[Series 2: T1 · sagittal · 5.0mm · 0.45mm/px · 3 of 31 slices shown (1 of 2)]
[im 1/31]
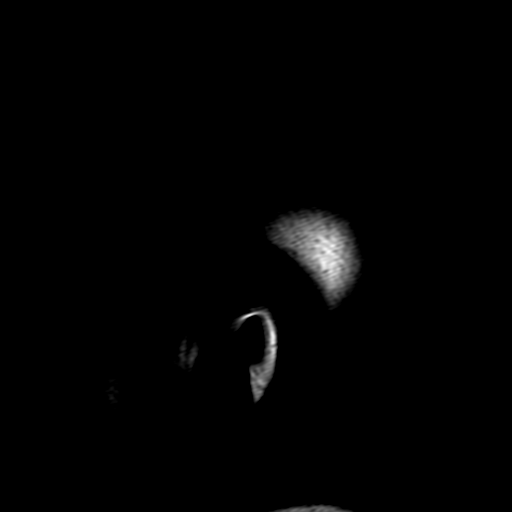
[im 16/31]
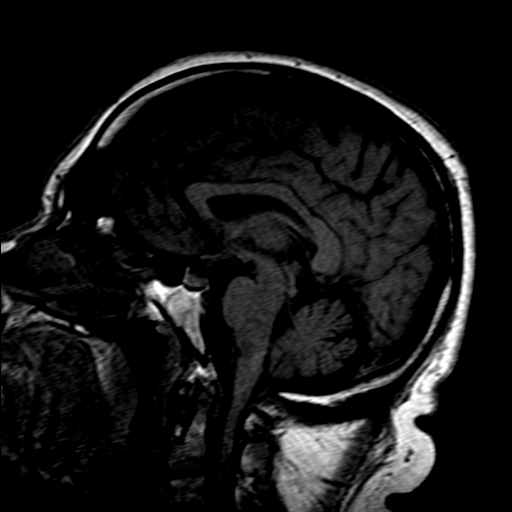
[im 31/31]
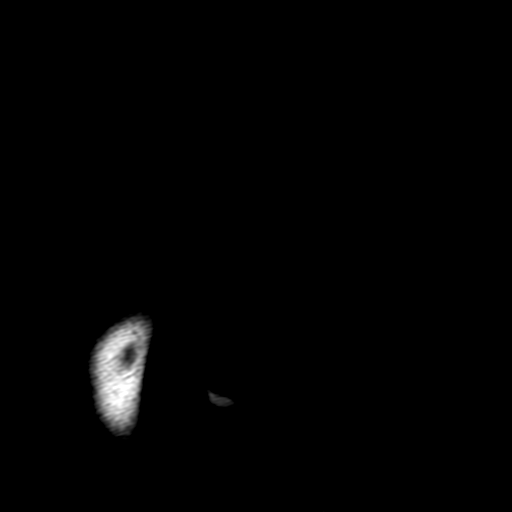

[Series 4: DWI · axial · 4.0mm · 0.94mm/px · z∈[-76,+89]mm · 5 of 45 slices shown (1 of 4)]
[im 1/45]
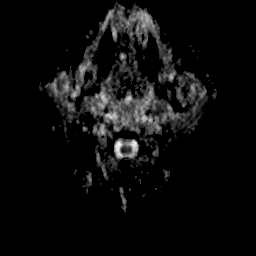
[im 12/45]
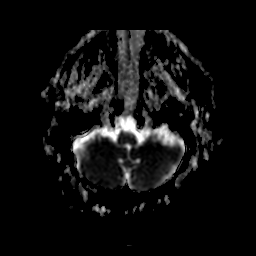
[im 23/45]
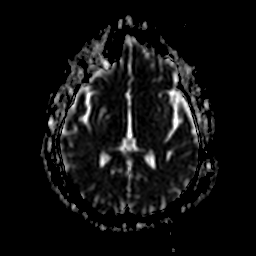
[im 34/45]
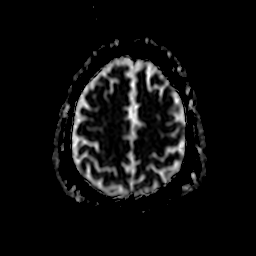
[im 45/45]
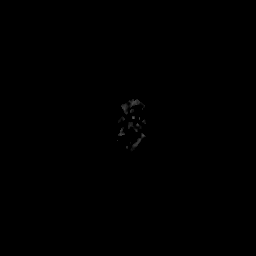

[Series 5: DWI · axial · 4.0mm · 0.94mm/px · z∈[-76,+89]mm · 6 of 45 slices shown (2 of 4)]
[im 1/45]
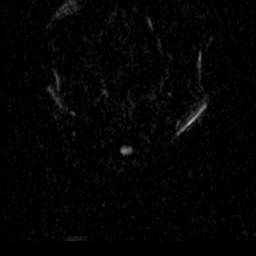
[im 9/45]
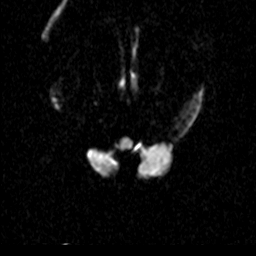
[im 18/45]
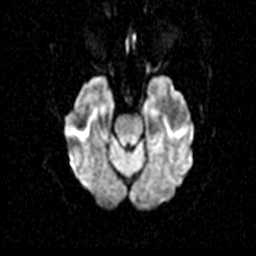
[im 27/45]
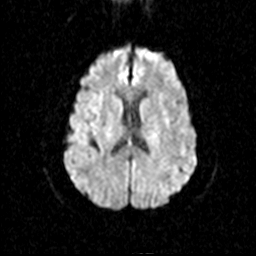
[im 36/45]
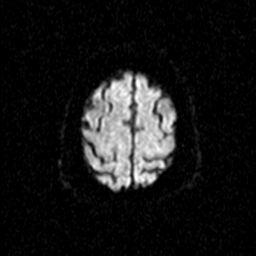
[im 45/45]
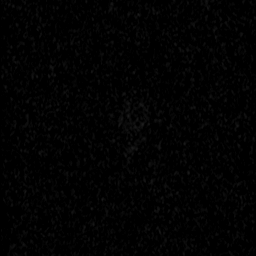

[Series 7: DWI · coronal · 5.0mm · 1.80mm/px · 5 of 39 slices shown (3 of 4)]
[im 1/39]
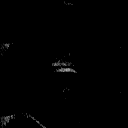
[im 10/39]
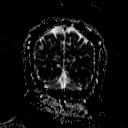
[im 20/39]
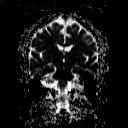
[im 29/39]
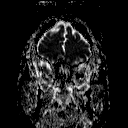
[im 39/39]
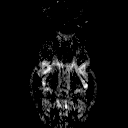

[Series 8: DWI · coronal · 5.0mm · 1.80mm/px · 5 of 37 slices shown (4 of 4)]
[im 1/37]
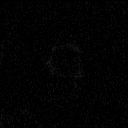
[im 10/37]
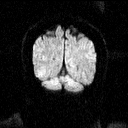
[im 19/37]
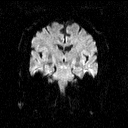
[im 28/37]
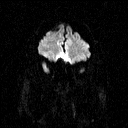
[im 37/37]
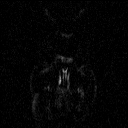

[Series 9: T2 · axial · 5.0mm · 0.45mm/px · z∈[-62,+93]mm · 4 of 27 slices shown (1 of 3)]
[im 1/27]
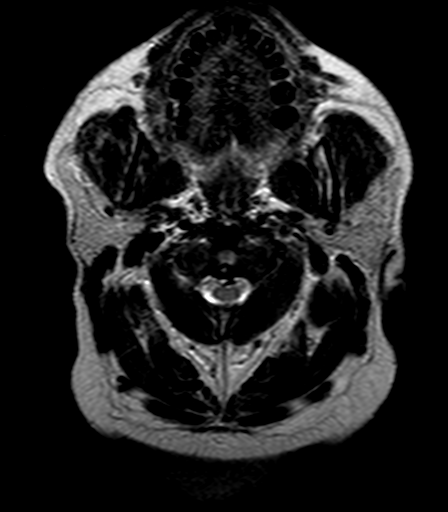
[im 9/27]
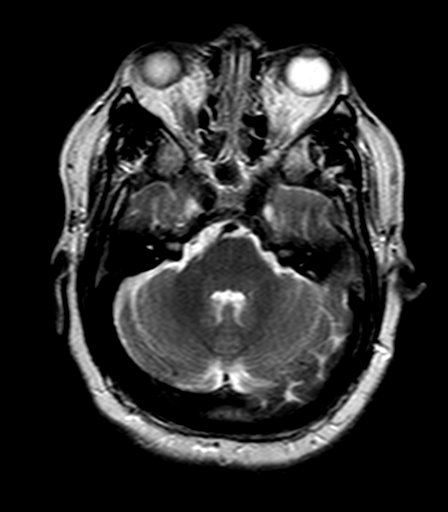
[im 18/27]
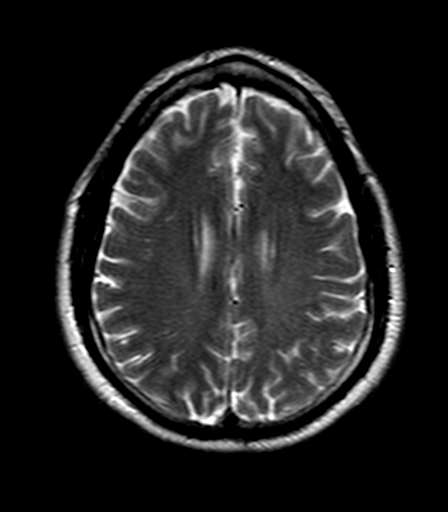
[im 27/27]
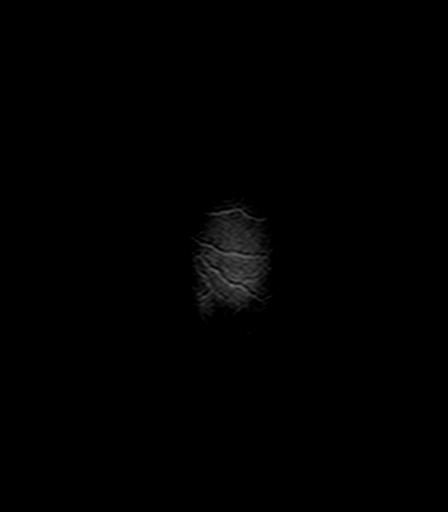

[Series 10: FLAIR · axial · 5.0mm · 0.90mm/px · z∈[-62,+92]mm · 4 of 27 slices shown]
[im 1/27]
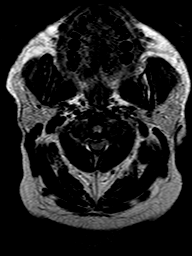
[im 9/27]
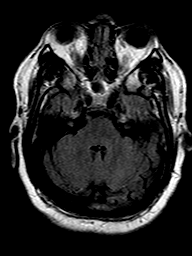
[im 18/27]
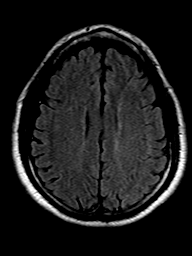
[im 27/27]
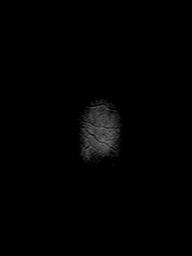

[Series 11: T2 · axial · 5.0mm · 0.45mm/px · z∈[-62,+93]mm · 4 of 27 slices shown (2 of 3)]
[im 1/27]
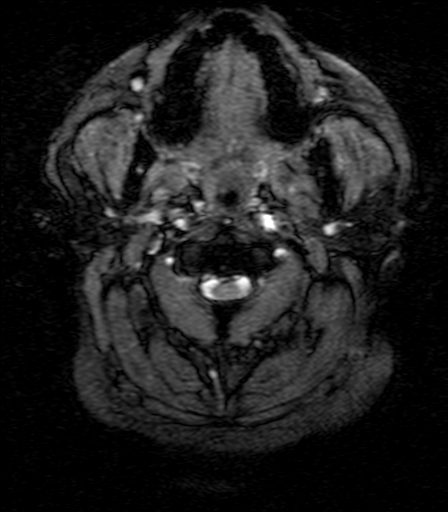
[im 9/27]
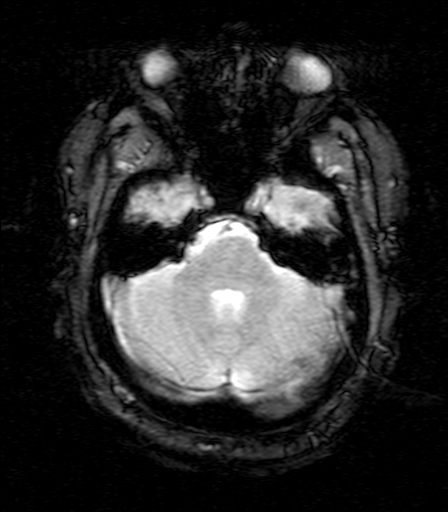
[im 18/27]
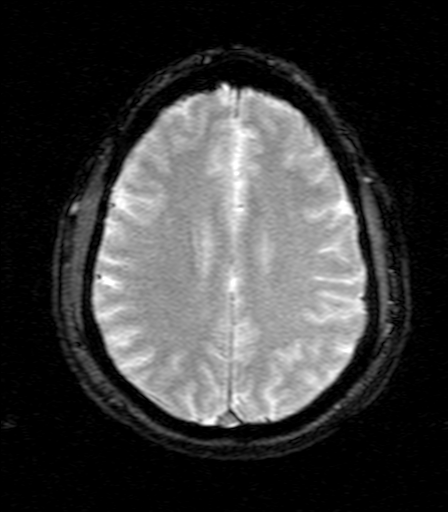
[im 27/27]
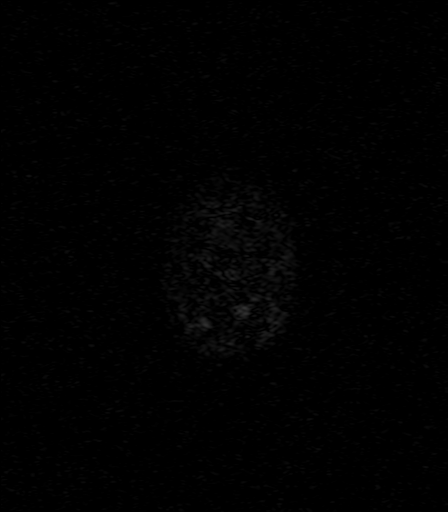

[Series 12: T1 · axial · 3.0mm · 0.45mm/px · 1 of 60 slices shown (2 of 2)]
[im 1/60]
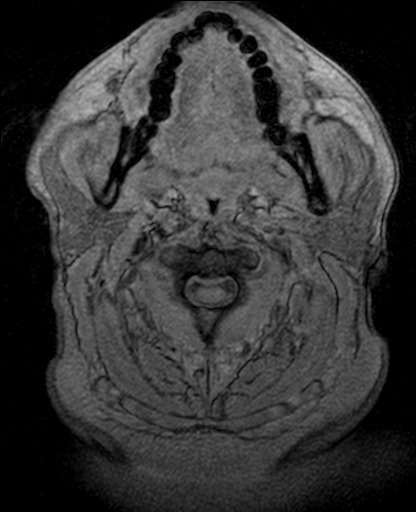

[Series 13: T2 · coronal · 5.0mm · 0.45mm/px · 4 of 29 slices shown (3 of 3)]
[im 1/29]
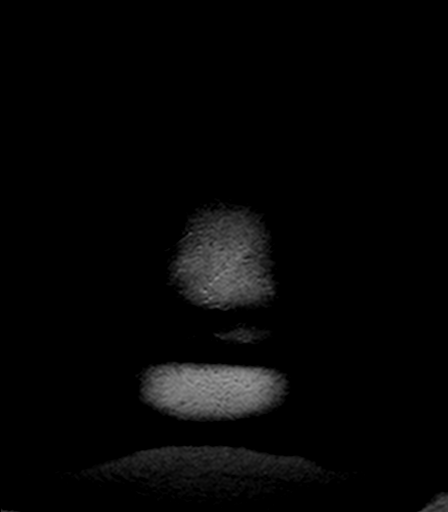
[im 10/29]
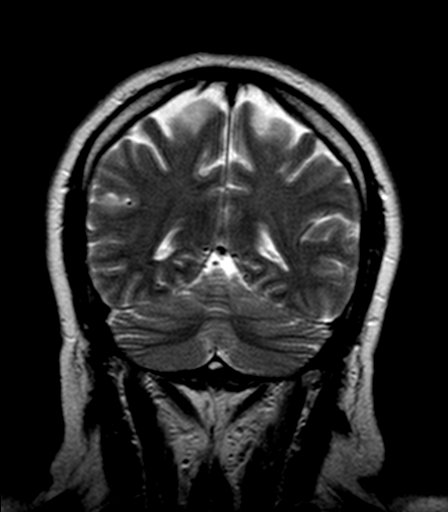
[im 19/29]
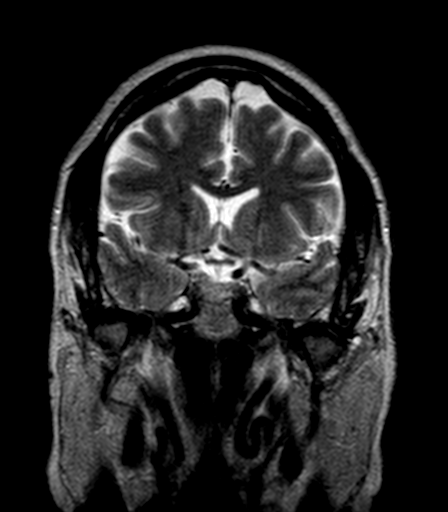
[im 29/29]
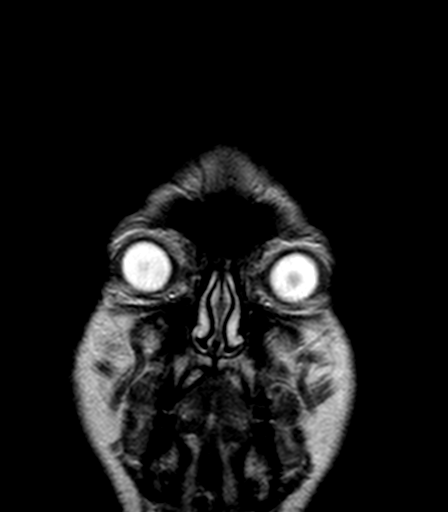

[41 of 48 positions shown; findings below may reference images not displayed]

FINDINGS: Cerebral volume is within normal limits for age. No restricted
diffusion to suggest acute infarction. No midline shift, mass
effect, evidence of mass lesion, ventriculomegaly, extra-axial
collection or acute intracranial hemorrhage. Cervicomedullary
junction and pituitary are within normal limits. Major intracranial
vascular flow voids are preserved. Gray and white matter signal is
within normal limits for age throughout the brain. No
encephalomalacia or chronic cerebral blood products identified.

Grossly normal visualized internal auditory structures. Paranasal
sinuses and mastoids are clear. Negative orbit and scalp soft
tissues. Visualized bone marrow signal is within normal limits.

There is evidence of upper cervical spine disc degeneration which
may result in mild spinal stenosis at C2-C3 as seen on series 2,
image 16.
IMPRESSION: 1. No acute intracranial abnormality. Normal for age noncontrast MRI
appearance of the brain.
2. Possible mild degenerative spinal stenosis in the visible upper
cervical spine.

## 2017-06-29 ENCOUNTER — Other Ambulatory Visit: Payer: Self-pay | Admitting: Psychiatry

## 2017-09-09 ENCOUNTER — Encounter: Payer: Self-pay | Admitting: Psychiatry

## 2017-09-09 ENCOUNTER — Other Ambulatory Visit: Payer: Self-pay

## 2017-09-09 ENCOUNTER — Ambulatory Visit (INDEPENDENT_AMBULATORY_CARE_PROVIDER_SITE_OTHER): Payer: Medicare Other | Admitting: Psychiatry

## 2017-09-09 VITALS — BP 160/79 | HR 109 | Temp 97.5°F | Wt 254.9 lb

## 2017-09-09 DIAGNOSIS — F3160 Bipolar disorder, current episode mixed, unspecified: Secondary | ICD-10-CM | POA: Diagnosis not present

## 2017-09-09 MED ORDER — OXCARBAZEPINE 150 MG PO TABS: 150.0000 mg | ORAL_TABLET | Freq: Every day | ORAL | 1 refills | Status: DC
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
## 2018-01-26 ENCOUNTER — Telehealth: Payer: Self-pay

## 2018-01-26 NOTE — Telephone Encounter (Signed)
pt wife called states that her husband having alot of anxiety and sadness and she wanted to know if she can give him two of the oxcarbazepine and see if that helps with his mood.

## 2018-02-06 ENCOUNTER — Ambulatory Visit: Payer: Medicare Other | Admitting: Psychiatry

## 2018-02-06 ENCOUNTER — Other Ambulatory Visit: Payer: Self-pay

## 2018-02-06 ENCOUNTER — Encounter: Payer: Self-pay | Admitting: Psychiatry

## 2018-02-06 VITALS — BP 166/92 | HR 118 | Temp 97.7°F | Wt 248.0 lb

## 2018-02-06 DIAGNOSIS — G4700 Insomnia, unspecified: Secondary | ICD-10-CM | POA: Diagnosis not present

## 2018-02-06 DIAGNOSIS — F3160 Bipolar disorder, current episode mixed, unspecified: Secondary | ICD-10-CM | POA: Diagnosis not present

## 2018-02-06 MED ORDER — OXCARBAZEPINE 150 MG PO TABS: 150.0000 mg | ORAL_TABLET | Freq: Two times a day (BID) | ORAL | 0 refills | Status: DC

## 2018-02-06 MED ORDER — QUETIAPINE FUMARATE 25 MG PO TABS: 25.0000 mg | ORAL_TABLET | Freq: Every day | ORAL | 0 refills | Status: DC
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
## 2018-02-06 NOTE — Patient Instructions (Signed)
Delirium Delirium is a state of mental confusion. It comes on quickly and causes significant changes in a person's thinking and behavior. People with delirium usually have trouble paying attention to what is going on or knowing where they are. They may become very withdrawn or very emotional and unable to sit still. They may even see or feel things that are not there (hallucinations). Delirium is a sign of a serious underlying medical condition. What are the causes? Delirium occurs when something suddenly affects the signals that the brain sends out. Brain signals can be affected by anything that puts severe stress on the body and brain and causes brain chemicals to be out of balance. The most common causes of delirium include:  Infections. These may be bacterial, viral, fungal, or protozoal.  Medicines. These include many over-the-counter and prescription medicines.  Recreational drugs.  Substance withdrawal. This occurs with sudden discontinuation of alcohol, certain medicines, or recreational drugs.  Surgery.  Sudden vascular events, such as stroke, brain hemorrhage, and severe migraine.  Other brain disorders, such as tumors, seizures, and physical head trauma.  Metabolic disorders, such as kidney or liver failure.  Low blood oxygen (anoxia). This may occur with lung disease, cardiac arrest, or carbon monoxide poisoning.  Hormone imbalances (endocrinopathies), such as an overactive thyroid (hyperthyroidism) or underactive thyroid (hypothyroidism).  Vitamin deficiencies.  What increases the risk? This condition is more likely to develop in:  Children.  Older people.  People who live alone.  People who have vision loss or hearing loss.  People who have existing brain disease, such as dementia.  People who have long-lasting (chronic) medical conditions, such as heart disease.  People who are hospitalized for long periods of time.  What are the signs or symptoms? Delirium  starts with a sudden change in a person's thinking or behavior. Symptoms come and go (fluctuate) over time, and they are often worse at the end of the day. Symptoms include:  Not being able to stay awake (drowsiness) or pay attention.  Being confused about places, time, and people.  Forgetfulness.  Having extreme energy levels. These may be low or high.  Changes in sleep patterns.  Extreme mood swings, such as anger or anxiety.  Focusing on things or ideas that are not important.  Rambling and senseless talking.  Difficulty speaking, understanding speech, or both.  Hallucinations.  Tremor or unsteady gait.  How is this diagnosed? People with delirium may not realize that they have the condition. Often, a family member or health care provider is the first person to notice the changes. The health care provider will obtain a detailed history of current symptoms, medical issues, medicines, and recreational drug use. The health care provider will perform a mental status examination by:  Asking questions to check for confusion.  Watching for abnormal behavior.  The health care provider may perform a physical exam and order lab tests or additional studies to determine the cause of the delirium. How is this treated? Treatment of delirium depends on the cause and severity. Delirium usually goes away within days or weeks of treating the underlying cause. In the meantime, the person should not be left alone because he or she may accidentally cause self-harm. Treatment includes supportive care, such as:  Increased light during the day and decreased light at night.  Low noise level.  Uninterrupted sleep.  A regular daily schedule.  Clocks and calendars to help with orientation.  Familiar objects, including the person's pictures and clothing.  Frequent visits   from familiar family and friends.  Healthy diet.  Exercise.  In more severe cases of delirium, medicine may be  prescribed to help the person to keep calm and think more clearly. Follow these instructions at home:  Any supportive care should be continued as told by the health care provider.  All medicines should be used as told by the health care provider. This is important.  The health care provider should be consulted before over-the-counter medicines, herbs, or supplements are used.  All follow-up visits should be kept as told by the health care provider. This is important.  Alcohol and recreational drugs should be avoided as told by the health care provider. Contact a health care provider if:  Symptoms do not get better or they become worse.  New symptoms of delirium develop.  Caring for the person at home does not seem safe.  Eating, drinking, or communicating stops.  There are side effects of medicines, such as changes in sleep patterns, dizziness, weight gain, restlessness, movement changes, or tremors. Get help right away if:  Serious thoughts occur about self-harm or about hurting others.  There are serious side effects of medicine, such as: ? Swelling of the face, lips, tongue, or throat. ? Fever, confusion, muscle spasms, or seizures. This information is not intended to replace advice given to you by your health care provider. Make sure you discuss any questions you have with your health care provider. Document Released: 03/11/2012 Document Revised: 11/23/2015 Document Reviewed: 08/10/2014 Elsevier Interactive Patient Education  2018 [employer]. Quetiapine tablets What is this medicine? QUETIAPINE (kwe TYE a peen) is an antipsychotic. It is used to treat schizophrenia and bipolar disorder, also known as manic-depression. This medicine may be used for other purposes; ask your health care provider or pharmacist if you have questions. COMMON BRAND NAME(S): Seroquel What should I tell my health care provider before I take this medicine? They need to know if you have any of  these conditions: -brain tumor or head injury -breast cancer -cataracts -diabetes -difficulty swallowing -heart disease -kidney disease -liver disease -low blood counts, like low white cell, platelet, or red cell counts -low blood pressure or dizziness when standing up -Parkinson's disease -previous heart attack -seizures -suicidal thoughts, plans, or attempt by you or a family member -thyroid disease -an unusual or allergic reaction to quetiapine, other medicines, foods, dyes, or preservatives -pregnant or trying to get pregnant -breast-feeding How should I use this medicine? Take this medicine by mouth. Swallow it with a drink of water. Follow the directions on the prescription label. If it upsets your stomach you can take it with food. Take your medicine at regular intervals. Do not take it more often than directed. Do not stop taking except on the advice of your doctor or health care professional. A special MedGuide will be given to you by the pharmacist with each prescription and refill. Be sure to read this information carefully each time. Talk to your pediatrician regarding the use of this medicine in children. While this drug may be prescribed for children as young as 10 years for selected conditions, precautions do apply. Patients over age 16 years may have a stronger reaction to this medicine and need smaller doses. Overdosage: If you think you have taken too much of this medicine contact a poison control center or emergency room at once. NOTE: This medicine is only for you. Do not share this medicine with others. What if I miss a dose? If you miss a dose, take  it as soon as you can. If it is almost time for your next dose, take only that dose. Do not take double or extra doses. What may interact with this medicine? Do not take this medicine with any of the following medications: -certain medicines for fungal infections like fluconazole, itraconazole, ketoconazole,  posaconazole, voriconazole -cisapride -dofetilide -dronedarone -droperidol -grepafloxacin -halofantrine -phenothiazines like chlorpromazine, mesoridazine, thioridazine -pimozide -sparfloxacin -ziprasidone This medicine may also interact with the following medications: -alcohol -antiviral medicines for HIV or AIDS -certain medicines for blood pressure -certain medicines for depression, anxiety, or psychotic disturbances like haloperidol, lorazepam -certain medicines for diabetes -certain medicines for Parkinson's disease -certain medicines for seizures like carbamazepine, phenobarbital, phenytoin -cimetidine -erythromycin -other medicines that prolong the QT interval (cause an abnormal heart rhythm) -rifampin -steroid medicines like prednisone or cortisone This list may not describe all possible interactions. Give your health care provider a list of all the medicines, herbs, non-prescription drugs, or dietary supplements you use. Also tell them if you smoke, drink alcohol, or use illegal drugs. Some items may interact with your medicine. What should I watch for while using this medicine? Visit your doctor or health care professional for regular checks on your progress. It may be several weeks before you see the full effects of this medicine. Your health care provider may suggest that you have your eyes examined prior to starting this medicine, and every 6 months thereafter. If you have been taking this medicine regularly for some time, do not suddenly stop taking it. You must gradually reduce the dose or your symptoms may get worse. Ask your doctor or health care professional for advice. Patients and their families should watch out for worsening depression or thoughts of suicide. Also watch out for sudden or severe changes in feelings such as feeling anxious, agitated, panicky, irritable, hostile, aggressive, impulsive, severely restless, overly excited and hyperactive, or not being able  to sleep. If this happens, especially at the beginning of antidepressant treatment or after a change in dose, call your health care professional. Bonita QuinYou may get dizzy or drowsy. Do not drive, use machinery, or do anything that needs mental alertness until you know how this medicine affects you. Do not stand or sit up quickly, especially if you are an older patient. This reduces the risk of dizzy or fainting spells. Alcohol can increase dizziness and drowsiness. Avoid alcoholic drinks. Do not treat yourself for colds, diarrhea or allergies. Ask your doctor or health care professional for advice, some ingredients may increase possible side effects. This medicine can reduce the response of your body to heat or cold. Dress warm in cold weather and stay hydrated in hot weather. If possible, avoid extreme temperatures like saunas, hot tubs, very hot or cold showers, or activities that can cause dehydration such as vigorous exercise. What side effects may I notice from receiving this medicine? Side effects that you should report to your doctor or health care professional as soon as possible: -allergic reactions like skin rash, itching or hives, swelling of the face, lips, or tongue -difficulty swallowing -fast or irregular heartbeat -fever or chills, sore throat -fever with rash, swollen lymph nodes, or swelling of the face -increased hunger or thirst -increased urination -problems with balance, talking, walking -seizures -stiff muscles -suicidal thoughts or other mood changes -uncontrollable head, mouth, neck, arm, or leg movements -unusually weak or tired Side effects that usually do not require medical attention (report to your doctor or health care professional if they continue or are bothersome): -change  in sex drive or performance -constipation -drowsy or dizzy -dry mouth -stomach upset -weight gain This list may not describe all possible side effects. Call your doctor for medical advice about  side effects. You may report side effects to FDA at 1-800-FDA-1088. Where should I keep my medicine? Keep out of the reach of children. Store at room temperature between 15 and 30 degrees C (59 and 86 degrees F). Throw away any unused medicine after the expiration date. NOTE: This sheet is a summary. It may not cover all possible information. If you have questions about this medicine, talk to your doctor, pharmacist, or health care provider.  2018 Elsevier/Gold Standard (2014-12-20 13:07:35)

## 2018-02-16 ENCOUNTER — Other Ambulatory Visit: Payer: Self-pay

## 2018-02-16 ENCOUNTER — Encounter: Payer: Self-pay | Admitting: Psychiatry

## 2018-02-16 ENCOUNTER — Ambulatory Visit: Payer: Medicare Other | Admitting: Psychiatry

## 2018-02-16 VITALS — BP 143/76 | HR 102 | Temp 97.8°F

## 2018-02-16 DIAGNOSIS — G4700 Insomnia, unspecified: Secondary | ICD-10-CM | POA: Diagnosis not present

## 2018-02-16 DIAGNOSIS — F3160 Bipolar disorder, current episode mixed, unspecified: Secondary | ICD-10-CM | POA: Diagnosis not present

## 2018-02-16 MED ORDER — QUETIAPINE FUMARATE 25 MG PO TABS: 25.0000 mg | ORAL_TABLET | Freq: Every day | ORAL | 1 refills | Status: DC | PRN

## 2018-02-16 MED ORDER — OXCARBAZEPINE 150 MG PO TABS: 150.0000 mg | ORAL_TABLET | Freq: Three times a day (TID) | ORAL | 0 refills | Status: DC

## 2018-02-16 MED ORDER — QUETIAPINE FUMARATE 50 MG PO TABS: 50.0000 mg | ORAL_TABLET | Freq: Every day | ORAL | 1 refills | Status: DC

## 2018-02-16 NOTE — Progress Notes (Signed)
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  Patient's wife [name] was also present during the appointment and provided collateral information.  Patient was seen 10 days ago for worsening mood symptoms and sleep issues.  Patient at that time also had UTI and was on antibiotic therapy.  It was discussed with patient as well as wife that his mood changes and disruption of sleep could be related to his UTI also.  Patient continues to have mood symptoms as well as anxiety symptoms.  Patient has been fearful to drive and has been very clingy with his wife.  Per wife patient is seen as being on the phone all the time, calling his mother and brother in [place].  Patient's  sleep however has improved since his last visit here.  He is currently on Seroquel.  He is tolerating it well.  He denies any side effects.  His UTI seems to be resolving at this time, he had an appointment with his primary medical doctor.  Discussed making medication changes as noted below.  Patient otherwise seems to be alert, oriented and was able to answer questions appropriately. Visit Diagnosis:    ICD-10-CM   1. Bipolar I disorder, most recent episode mixed (HCC) F31.60 OXcarbazepine (TRILEPTAL) 150 MG tablet    QUEtiapine (SEROQUEL) 25 MG tablet   secondary to head injury  2. Insomnia, unspecified type G47.00     Past Psychiatric History: Reviewed past psychiatric history from my progress note on 09/09/2017.  Past Medical History:  Past Medical History:  Diagnosis Date  . Bipolar disorder (HCC)    . Diabetes mellitus without complication (HCC)   . History of blood clots   . Hypertension     Past Surgical History:  Procedure Laterality Date  . BACK SURGERY    . CARPAL TUNNEL RELEASE Bilateral   . TONSILLECTOMY      Family Psychiatric History: Reviewed family psychiatric history from my progress note on 09/09/2017  Family History:  Family History  Problem Relation Age of Onset  . Depression Father    Substance abuse history: Denies  Social History: Reviewed social history from my progress note on 09/09/2017. Social History   Socioeconomic History  . Marital status: Married    Spouse name: thelma  . Number of children: 0  . Years of education: Not on file  . Highest education level: Associate degree: occupational, Scientist, product/process developmenttechnical, or vocational program  Occupational History  . Not on file  Social Needs  . Financial resource strain: Not hard at all  . Food insecurity:    Worry: Never true    Inability: Never true  . Transportation needs:    Medical: No    Non-medical: No  Tobacco Use  . Smoking status: Never Smoker  . Smokeless tobacco: Never Used  Substance and Sexual Activity  . Alcohol use: Yes    Comment: social  . Drug use: No  . Sexual activity: Not on file  Lifestyle  . Physical activity:    Days per week: 0 days    Minutes per session: 0 min  . Stress: Not  at all  Relationships  . Social connections:    Talks on phone: Not on file    Gets together: Not on file    Attends religious service: More than 4 times per year    Active member of club or organization: No    Attends meetings of clubs or organizations: Never    Relationship status: Married  Other Topics Concern  . Not on file  Social History Narrative  . Not on file    Allergies: No Known Allergies  Metabolic Disorder Labs: Lab Results  Component Value Date   HGBA1C 7.0 (H) 01/03/2016   No results found for: PROLACTIN No results found for: CHOL, TRIG, HDL, CHOLHDL, VLDL, LDLCALC No  results found for: TSH  Therapeutic Level Labs: Lab Results  Component Value Date   LITHIUM 1.24 (H) 01/04/2016   LITHIUM 1.51 (HH) 01/03/2016   No results found for: VALPROATE No components found for:  CBMZ  Current Medications: Current Outpatient Medications  Medication Sig Dispense Refill  . amLODipine (NORVASC) 5 MG tablet Take 1 tablet (5 mg total) by mouth daily. 30 tablet 2  . atorvastatin (LIPITOR) 80 MG tablet Take 80 mg by mouth at bedtime.    Marland Kitchen glipiZIDE (GLUCOTROL XL) 10 MG 24 hr tablet Take 10 mg by mouth daily with breakfast.     . hydrochlorothiazide (MICROZIDE) 12.5 MG capsule Take by mouth.    . losartan (COZAAR) 100 MG tablet Take by mouth.    . metFORMIN (GLUCOPHAGE-XR) 500 MG 24 hr tablet Take 1,000 mg by mouth at bedtime.     . OXcarbazepine (TRILEPTAL) 150 MG tablet Take 1 tablet (150 mg total) by mouth 3 (three) times daily. 90 tablet 0  . QUEtiapine (SEROQUEL) 25 MG tablet Take 1 tablet (25 mg total) by mouth daily as needed (for severe anxiety sx). 30 tablet 1  . QUEtiapine (SEROQUEL) 50 MG tablet Take 1 tablet (50 mg total) by mouth at bedtime. 30 tablet 1  . rosuvastatin (CRESTOR) 20 MG tablet     . spironolactone (ALDACTONE) 100 MG tablet     . valsartan-hydrochlorothiazide (DIOVAN-HCT) 320-25 MG tablet     . warfarin (COUMADIN) 1 MG tablet Take 1 mg by mouth See admin instructions. Take 2 tablets (2 mg) on Monday and Thursday only along with 5 mg tablet to equal 7 mg total.    . warfarin (COUMADIN) 2 MG tablet Take by mouth.    . warfarin (COUMADIN) 5 MG tablet Take by mouth.     No current facility-administered medications for this visit.      Musculoskeletal: Strength & Muscle Tone: within normal limits Gait & Station: walks with walker Patient leans: N/A  Psychiatric Specialty Exam: Review of Systems  Psychiatric/Behavioral: The patient is nervous/anxious and has insomnia.   All other systems reviewed and are negative.   Blood pressure (!)  143/76, pulse (!) 102, temperature 97.8 F (36.6 C), temperature source Oral.There is no height or weight on file to calculate BMI.  General Appearance: Casual  Eye Contact:  Fair  Speech:  Normal Rate  Volume:  Normal  Mood:  Anxious  Affect:  Appropriate  Thought Process:  Goal Directed and Descriptions of Associations: Intact  Orientation:  Full (Time, Place, and Person)  Thought Content: Logical   Suicidal Thoughts:  No  Homicidal Thoughts:  No  Memory:  Immediate;   Fair Recent;   Fair Remote;   Fair  Judgement:  Fair  Insight:  Fair  Psychomotor Activity:  Normal  Concentration:  Concentration: Fair and Attention Span: Fair  Recall:  [employer]  Fund of Knowledge: Fair  Language: Fair  Akathisia:  No  Handed:  Right  AIMS (if indicated): 0  Assets:  Communication Skills Desire for Improvement Housing  ADL's:  Intact  Cognition: WNL  Sleep:  improving   Screenings:   Assessment and Plan: [name] LongsJoseph is a 62 year old African-American male who 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  Patient recently was treated for UTI and also had severe constipation which per patient as well as family is currently resolving.  Patient continues to have some anxiety and agitation.  His sleep however seems to be improving.  We will continue to make medication changes as noted below.  Plan Bipolar disorder Increase Trileptal to 150 mg p.o. 3 times daily. Increase Seroquel to 50 mg p.o. nightly Add Seroquel 25 mg p.o. nightly as needed during the day for severe anxiety/agitation.  Insomnia Increase Seroquel to 50 mg p.o. nightly  I have discussed the possibility of delirium secondary to multiple etiology-likely also contributed by his UTI with patient as well as family.  Patient as well as family were given education materials.  His UTI seems to be resolving.  Discussed with family to give  medications more time.  However if he decompensate, discussed with family as well as patient to go to the nearest emergency department.  Follow up in 1 week or sooner if needed.  More than 50 % of the time was spent for psychoeducation and supportive psychotherapy and care coordination.  This note was generated in part or whole with voice recognition software. Voice recognition is usually quite accurate but there are transcription errors that can and very often do occur. I apologize for any typographical errors that were not detected and corrected.       [name] LongsSaramma Eappen, MD 02/16/2018, 3:19 PM

## 2018-02-24 ENCOUNTER — Ambulatory Visit: Payer: Medicare Other | Admitting: Psychiatry

## 2018-02-24 ENCOUNTER — Encounter: Payer: Self-pay | Admitting: Psychiatry

## 2018-02-24 ENCOUNTER — Other Ambulatory Visit: Payer: Self-pay

## 2018-02-24 VITALS — BP 172/86 | HR 76 | Temp 97.5°F

## 2018-02-24 DIAGNOSIS — G4701 Insomnia due to medical condition: Secondary | ICD-10-CM

## 2018-02-24 DIAGNOSIS — F3160 Bipolar disorder, current episode mixed, unspecified: Secondary | ICD-10-CM

## 2018-02-24 DIAGNOSIS — F419 Anxiety disorder, unspecified: Secondary | ICD-10-CM

## 2018-02-24 DIAGNOSIS — R030 Elevated blood-pressure reading, without diagnosis of hypertension: Secondary | ICD-10-CM | POA: Diagnosis not present

## 2018-02-24 MED ORDER — QUETIAPINE FUMARATE 25 MG PO TABS: 25.0000 mg | ORAL_TABLET | Freq: Every morning | ORAL | 1 refills | Status: DC

## 2018-02-24 MED ORDER — OXCARBAZEPINE 300 MG PO TABS: 300.0000 mg | ORAL_TABLET | Freq: Two times a day (BID) | ORAL | 1 refills | Status: DC

## 2018-02-24 NOTE — Progress Notes (Signed)
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  Patient's wife [name] was also present during the appointment and she provided collateral information.  Patient was last seen on 02/16/2018.  At that time patient was recovering from UTI and also had worsening mood symptoms and sleep problems.  Patients with medications were readjusted to address his symptoms at that time.  Patient today reports he has seen some improvement in his mood symptoms.  He however continues to have some good days and bad days.  Patient reports he continues to feel anxious often and has not started doing the things that he would otherwise do.  He reports since he feels lonely he keeps calling his wife on her phone while she is at work.  Per wife patient has been calling her at work several times a day which is frustrating to her.  Patient also depends on her for getting out of the house since he stopped driving soon after he had the recent UTI.  Patient reports his sleep is improved with the addition of Seroquel.  Patient denies any suicidality or perceptual disturbances.  Discussed elevated blood pressure with patient as well as wife and per collateral information from wife patient had stopped taking all of his medications last week.  However he restarted taking them this past weekend.   Visit Diagnosis:    ICD-10-CM   1. Bipolar I disorder, most recent episode mixed (HCC) F31.60 QUEtiapine (SEROQUEL) 25 MG tablet    Oxcarbazepine (TRILEPTAL) 300 MG tablet   secondary to head injury  2. Insomnia due  to medical condition G47.01   3. Anxiety disorder, unspecified type F41.9   4. Elevated blood pressure reading R03.0     Past Psychiatric History: I have reviewed past psychiatric history from my progress note on 09/09/2017  Past Medical History:  Past Medical History:  Diagnosis Date  . Bipolar disorder (HCC)   . Diabetes mellitus without complication (HCC)   . History of blood clots   . Hypertension     Past Surgical History:  Procedure Laterality Date  . BACK SURGERY    . CARPAL TUNNEL RELEASE Bilateral   . TONSILLECTOMY      Family Psychiatric History: Reviewed family psychiatric history from my progress note on 09/09/2017.  Family History:  Family History  Problem Relation Age of Onset  . Depression Father     Social History: Reviewed social history from my progress note on 09/09/2017 Social History   Socioeconomic History  . Marital status: Married    Spouse name: [name]  . Number of children: 0  . Years of education: Not on file  . Highest education level: Associate degree: occupational, Scientist, product/process development, or vocational program  Occupational History  . Not on file  Social Needs  . Financial resource strain: Not hard at all  . Food insecurity:    Worry: Never true    Inability: Never true  . Transportation needs:    Medical: No    Non-medical: No  Tobacco Use  . Smoking status: Never Smoker  . Smokeless tobacco: Never Used  Substance and  Sexual Activity  . Alcohol use: Yes    Comment: social  . Drug use: No  . Sexual activity: Not on file  Lifestyle  . Physical activity:    Days per week: 0 days    Minutes per session: 0 min  . Stress: Not at all  Relationships  . Social connections:    Talks on phone: Not on file    Gets together: Not on file    Attends religious service: More than 4 times per year    Active member of club or organization: No    Attends meetings of clubs or organizations: Never    Relationship status: Married  Other Topics Concern  .  Not on file  Social History Narrative  . Not on file    Allergies: No Known Allergies  Metabolic Disorder Labs: Lab Results  Component Value Date   HGBA1C 7.0 (H) 01/03/2016   No results found for: PROLACTIN No results found for: CHOL, TRIG, HDL, CHOLHDL, VLDL, LDLCALC No results found for: TSH  Therapeutic Level Labs: Lab Results  Component Value Date   LITHIUM 1.24 (H) 01/04/2016   LITHIUM 1.51 (HH) 01/03/2016   No results found for: VALPROATE No components found for:  CBMZ  Current Medications: Current Outpatient Medications  Medication Sig Dispense Refill  . amLODipine (NORVASC) 5 MG tablet Take 1 tablet (5 mg total) by mouth daily. 30 tablet 2  . atorvastatin (LIPITOR) 80 MG tablet Take 80 mg by mouth at bedtime.    Marland Kitchen. glipiZIDE (GLUCOTROL XL) 10 MG 24 hr tablet Take 10 mg by mouth daily with breakfast.     . hydrochlorothiazide (MICROZIDE) 12.5 MG capsule Take by mouth.    . losartan (COZAAR) 100 MG tablet Take by mouth.    . metFORMIN (GLUCOPHAGE-XR) 500 MG 24 hr tablet Take 1,000 mg by mouth at bedtime.     . OXcarbazepine (TRILEPTAL) 150 MG tablet Take 1 tablet (150 mg total) by mouth 3 (three) times daily. 90 tablet 0  . Oxcarbazepine (TRILEPTAL) 300 MG tablet Take 1 tablet (300 mg total) by mouth 2 (two) times daily. 60 tablet 1  . QUEtiapine (SEROQUEL) 25 MG tablet Take 1 tablet (25 mg total) by mouth every morning. 30 tablet 1  . QUEtiapine (SEROQUEL) 50 MG tablet Take 1 tablet (50 mg total) by mouth at bedtime. 30 tablet 1  . rosuvastatin (CRESTOR) 20 MG tablet     . spironolactone (ALDACTONE) 100 MG tablet     . valsartan-hydrochlorothiazide (DIOVAN-HCT) 320-25 MG tablet     . warfarin (COUMADIN) 1 MG tablet Take 1 mg by mouth See admin instructions. Take 2 tablets (2 mg) on Monday and Thursday only along with 5 mg tablet to equal 7 mg total.    . warfarin (COUMADIN) 2 MG tablet Take by mouth.    . warfarin (COUMADIN) 5 MG tablet Take by mouth.     No  current facility-administered medications for this visit.      Musculoskeletal: Strength & Muscle Tone: within normal limits Gait & Station: walks with walker Patient leans: N/A  Psychiatric Specialty Exam: Review of Systems  Psychiatric/Behavioral: The patient is nervous/anxious.   All other systems reviewed and are negative.   Blood pressure (!) 172/86, pulse 76, temperature (!) 97.5 F (36.4 C), temperature source Oral.There is no height or weight on file to calculate BMI.  General Appearance: Casual  Eye Contact:  Fair  Speech:  Clear and Coherent  Volume:  Normal  Mood:  Anxious  Affect:  Congruent  Thought Process:  Goal Directed and Descriptions of Associations: Intact  Orientation:  Full (Time, Place, and Person)  Thought Content: Logical   Suicidal Thoughts:  No  Homicidal Thoughts:  No  Memory:  Immediate;   Fair Recent;   Fair Remote;   Fair  Judgement:  Fair  Insight:  Fair  Psychomotor Activity:  Normal  Concentration:  Concentration: Fair and Attention Span: Fair  Recall:  [employer] of Knowledge: Fair  Language: Fair  Akathisia:  No  Handed:  Right  AIMS (if indicated):denies rigidity, stiffness  Assets:  Communication Skills Desire for Improvement Housing Social Support  ADL's:  Intact  Cognition: WNL  Sleep:  improved   Screenings:   Assessment and Plan: Abdifatah is a 62 year old African-American male who has a 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  Patient recently was treated for UTI and severe constipation which is currently resolving.  Patient reports some improvement in his mood as well as sleep however he continues to have some anxiety symptoms.  Discussed medication changes as well as referral for psychotherapy as noted below.  Plan For bipolar disorder Increase Trileptal to 300 mg p.o. twice daily. Change Seroquel to 25 mg p.o. daily in the a.m. and continue Seroquel 50 mg  p.o. Nightly  For insomnia Continue Seroquel 50 mg p.o. nightly.  For anxiety symptoms unspecified Patient will start psychotherapy.  Patient to schedule an appointment with therapist in clinic.  Elevated blood pressure reading Blood pressure was repeated in clinic and it has come down however it continues to be elevated.  Initially his blood pressure was 200/94 and on repeat 172/86.  Discussed with patient as well as family to reach out to primary medical doctor for further management.  Follow-up in clinic in 2 weeks or sooner if needed.  More than 50 % of the time was spent for psychoeducation and supportive psychotherapy and care coordination.  This note was generated in part or whole with voice recognition software. Voice recognition is usually quite accurate but there are transcription errors that can and very often do occur. I apologize for any typographical errors that were not detected and corrected.       [name], MD 02/24/2018, 2:58 PM

## 2018-02-25 ENCOUNTER — Ambulatory Visit: Payer: Medicare Other | Admitting: Licensed Clinical Social Worker

## 2018-02-25 DIAGNOSIS — F3162 Bipolar disorder, current episode mixed, moderate: Secondary | ICD-10-CM | POA: Diagnosis not present
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
## 2018-03-04 ENCOUNTER — Ambulatory Visit: Payer: Medicare Other | Admitting: Licensed Clinical Social Worker

## 2018-03-04 DIAGNOSIS — F319 Bipolar disorder, unspecified: Secondary | ICD-10-CM | POA: Diagnosis not present
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
## 2018-03-10 ENCOUNTER — Ambulatory Visit: Payer: Medicare Other | Admitting: Psychiatry

## 2018-03-10 ENCOUNTER — Encounter: Payer: Self-pay | Admitting: Psychiatry

## 2018-03-10 ENCOUNTER — Other Ambulatory Visit: Payer: Self-pay

## 2018-03-10 VITALS — BP 168/90 | HR 86 | Temp 98.4°F | Wt 248.0 lb

## 2018-03-10 DIAGNOSIS — F411 Generalized anxiety disorder: Secondary | ICD-10-CM

## 2018-03-10 DIAGNOSIS — R030 Elevated blood-pressure reading, without diagnosis of hypertension: Secondary | ICD-10-CM | POA: Diagnosis not present

## 2018-03-10 DIAGNOSIS — F3162 Bipolar disorder, current episode mixed, moderate: Secondary | ICD-10-CM | POA: Diagnosis not present

## 2018-03-10 DIAGNOSIS — G4701 Insomnia due to medical condition: Secondary | ICD-10-CM | POA: Diagnosis not present

## 2018-03-10 NOTE — Progress Notes (Signed)
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  Patient presented along with his wife [name].  Patient today reports he is currently improving on the current medication regimen.  He continues to have some periods of anxiety symptoms on and off especially during the morning.  He reports that it usually happens when his wife leaves for work and he suddenly feels lonely and anxious.  He has been working with his therapist which has been extremely helpful.  He has been trying to make use of the coping techniques and relaxation techniques as discussed during therapy sessions.  He also has been taking medications as needed when he has extreme anxiety.  He however was able to confide in Clinical research associate that he has not been very compliant with the medications as prescribed.  He reports he does not take the Trileptal twice a day but has been taking it once a day or so.  Discussed the importance of being compliant on his medications and the need for the medication to build up in his system to give the full benefit.  Discussed with wife also to monitor whether he is taking his medications right.  Discussed setting up his medication in a pillbox and setting reminders to help him take his medications regularly.  Pt today appears to be alert and oriented to time person place and situation.  Patient denies any suicidality or homicidality.  Patient denies any perceptual disturbances. Visit Diagnosis:    ICD-10-CM   1. Bipolar 1 disorder, mixed, moderate (HCC) F31.62   2. Insomnia due to medical condition  G47.01   3. GAD (generalized anxiety disorder) F41.1   4. Elevated blood pressure reading R03.0     Past Psychiatric History: Reviewed past psychiatric history from my progress note on 09/09/2017.  Past Medical History:  Past Medical History:  Diagnosis Date  . Bipolar disorder (HCC)   . Diabetes mellitus without complication (HCC)   . History of blood clots   . Hypertension     Past Surgical History:  Procedure Laterality Date  . BACK SURGERY    . CARPAL TUNNEL RELEASE Bilateral   . TONSILLECTOMY      Family Psychiatric History: Have reviewed family psychiatric history from my progress note on 09/09/2017.  Family History:  Family History  Problem Relation Age of Onset  . Depression Father     Social History: I have reviewed social history from my progress note on 09/09/2017. Social History   Socioeconomic History  . Marital status: Married    Spouse name: thelma  . Number of children: 0  . Years of education: Not on file  . Highest education level: Associate degree: occupational, Scientist, product/process development, or vocational program  Occupational History  . Not on file  Social Needs  . Financial resource strain: Not hard at all  . Food insecurity:    Worry: Never true    Inability: Never true  . Transportation needs:    Medical: No    Non-medical: No  Tobacco Use  . Smoking status: Never Smoker  . Smokeless tobacco: Never Used  Substance and Sexual Activity  .  Alcohol use: Yes    Comment: social  . Drug use: No  . Sexual activity: Not on file  Lifestyle  . Physical activity:    Days per week: 0 days    Minutes per session: 0 min  . Stress: Not at all  Relationships  . Social connections:    Talks on phone: Not on file    Gets together: Not on file    Attends religious service: More than 4 times per year    Active member of club or organization: No    Attends meetings of clubs or organizations: Never    Relationship status: Married  Other Topics Concern  . Not on file   Social History Narrative  . Not on file    Allergies: No Known Allergies  Metabolic Disorder Labs: Lab Results  Component Value Date   HGBA1C 7.0 (H) 01/03/2016   No results found for: PROLACTIN No results found for: CHOL, TRIG, HDL, CHOLHDL, VLDL, LDLCALC No results found for: TSH  Therapeutic Level Labs: Lab Results  Component Value Date   LITHIUM 1.24 (H) 01/04/2016   LITHIUM 1.51 (HH) 01/03/2016   No results found for: VALPROATE No components found for:  CBMZ  Current Medications: Current Outpatient Medications  Medication Sig Dispense Refill  . amLODipine (NORVASC) 5 MG tablet Take 1 tablet (5 mg total) by mouth daily. 30 tablet 2  . atorvastatin (LIPITOR) 80 MG tablet Take 80 mg by mouth at bedtime.    Marland Kitchen glipiZIDE (GLUCOTROL XL) 10 MG 24 hr tablet Take 10 mg by mouth daily with breakfast.     . hydrochlorothiazide (MICROZIDE) 12.5 MG capsule Take by mouth.    . losartan (COZAAR) 100 MG tablet Take by mouth.    . metFORMIN (GLUCOPHAGE-XR) 500 MG 24 hr tablet Take 1,000 mg by mouth at bedtime.     . Oxcarbazepine (TRILEPTAL) 300 MG tablet Take 1 tablet (300 mg total) by mouth 2 (two) times daily. 60 tablet 1  . QUEtiapine (SEROQUEL) 25 MG tablet Take 1 tablet (25 mg total) by mouth every morning. 30 tablet 1  . QUEtiapine (SEROQUEL) 50 MG tablet Take 1 tablet (50 mg total) by mouth at bedtime. 30 tablet 1  . rosuvastatin (CRESTOR) 20 MG tablet     . spironolactone (ALDACTONE) 100 MG tablet     . valsartan-hydrochlorothiazide (DIOVAN-HCT) 320-25 MG tablet     . warfarin (COUMADIN) 1 MG tablet Take 1 mg by mouth See admin instructions. Take 2 tablets (2 mg) on Monday and Thursday only along with 5 mg tablet to equal 7 mg total.    . warfarin (COUMADIN) 2 MG tablet Take by mouth.    . warfarin (COUMADIN) 5 MG tablet Take by mouth.     No current facility-administered medications for this visit.      Musculoskeletal: Strength & Muscle Tone: within normal limits Gait  & Station: normal Patient leans: N/A  Psychiatric Specialty Exam: Review of Systems  Psychiatric/Behavioral: The patient is nervous/anxious.   All other systems reviewed and are negative.   Blood pressure (!) 168/90, pulse 86, temperature 98.4 F (36.9 C), temperature source Oral, weight 248 lb (112.5 kg).Body mass index is 40.03 kg/m.  General Appearance: Casual  Eye Contact:  Fair  Speech:  Clear and Coherent  Volume:  Normal  Mood:  Anxious  Affect:  Congruent  Thought Process:  Goal Directed and Descriptions of Associations: Intact  Orientation:  Full (Time, Place, and Person)  Thought Content: Logical  Suicidal Thoughts:  No  Homicidal Thoughts:  No  Memory:  Immediate;   Fair Recent;   Fair Remote;   Fair  Judgement:  Fair  Insight:  Fair  Psychomotor Activity:  Normal  Concentration:  Concentration: Fair and Attention Span: Fair  Recall:  [employer] of Knowledge: Fair  Language: Fair  Akathisia:  No  Handed:  Right  AIMS (if indicated): 0  Assets:  Communication Skills Desire for Improvement Social Support  ADL's:  Intact  Cognition: WNL  Sleep:  Fair   Screenings: AIMS     [facility] Visit [facility]/10/2018 in [facility]  AIMS Total Score  0       Assessment and Plan: Uzoma is a 62 year old African-American male who has a 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  Patient recently was treated for UTI, severe constipation which are currently improved.  Patient reports his mood symptoms as improving.  He continues to struggle with some anxiety symptoms on and off.  He also reports he has not been very compliant with his medications.  Provided medication education as well as the need for compliance.  Patient will continue psychotherapy sessions which are working well.  Plan as noted below.  Plan For bipolar disorder Continue Trileptal 300 mg p.o. twice daily Continue  Seroquel 25 mg p.o. daily in the a.m. and 50 mg at bedtime.  Insomnia Continue Seroquel 50 mg p.o. nightly  For anxiety symptoms  Patient will continue to work with her therapist.  He reports therapy sessions as helpful.  For elevated blood pressure reading Patient's blood pressure continues to be high.  Patient's pulse rate initially was 135 however on recheck came back to 86.  This was discussed with patient as well as wife. Discussed following up with primary medical doctor.  Also discussed getting an EKG since he is on medications like Seroquel.  Follow-up in clinic in 4 weeks or sooner if needed.  More than 50 % of the time was spent for psychoeducation and supportive psychotherapy and care coordination.  This note was generated in part or whole with voice recognition software. Voice recognition is usually quite accurate but there are transcription errors that can and very often do occur. I apologize for any typographical errors that were not detected and corrected.         [name], MD 03/10/2018, 4:42 PM

## 2018-03-11 ENCOUNTER — Ambulatory Visit: Payer: Medicare Other | Admitting: Licensed Clinical Social Worker

## 2018-03-12 ENCOUNTER — Ambulatory Visit: Payer: Medicare Other | Admitting: Psychiatry

## 2018-03-17 ENCOUNTER — Ambulatory Visit (INDEPENDENT_AMBULATORY_CARE_PROVIDER_SITE_OTHER): Payer: Medicare Other | Admitting: Licensed Clinical Social Worker

## 2018-03-17 ENCOUNTER — Encounter: Payer: Self-pay | Admitting: Licensed Clinical Social Worker

## 2018-03-17 DIAGNOSIS — F3162 Bipolar disorder, current episode mixed, moderate: Secondary | ICD-10-CM

## 2018-03-17 NOTE — Progress Notes (Signed)
   THERAPIST PROGRESS NOTE  Session Time: 230-330  Participation Level: Active  Behavioral Response: NeatAlertAnxious  Type of Therapy: Individual Therapy  Treatment Goals addressed: Anxiety  Interventions: DBT  Summary: [name] is a 62 y.o. male who presents with symptoms related to depression and anxiety. [name] reports he feels his symptoms have improved, but he reports continuing to worry when his wife leaves for work. Additionally, he reports feeling a lack of motivation and states, "at night, I plan to do things the next day, but when it comes time to do it, I just don't feel like it." We discussed the mindfulness activities he has been working on, and he reports deep breathing has been helping him.   Suicidal/Homicidal: NAwithout intent/plan  Therapist Response: [name] admits he had not previously been taking his medication as prescribed, but reports since his last visit with MD he has been adherent with medications. He reports not utilizing coping mechanisms "all the time, but maybe once a week." I asked [name] to complete one activity he feels he used to find joy in. He reports he plans to walk around his neighborhood before our next visit.   Plan: Return again in 1 weeks.  Diagnosis: Axis I: Bipolar, mixed    Axis II: No diagnosis    Heidi DachKelsey Craig, LCSW 03/17/2018

## 2018-04-02 ENCOUNTER — Ambulatory Visit: Payer: Medicare Other | Admitting: Licensed Clinical Social Worker

## 2018-04-08 ENCOUNTER — Ambulatory Visit: Payer: Medicare Other | Admitting: Licensed Clinical Social Worker

## 2018-04-08 ENCOUNTER — Encounter: Payer: Self-pay | Admitting: Licensed Clinical Social Worker

## 2018-04-08 DIAGNOSIS — F3162 Bipolar disorder, current episode mixed, moderate: Secondary | ICD-10-CM

## 2018-04-08 NOTE — Progress Notes (Signed)
   THERAPIST PROGRESS NOTE  Session Time: 1230-130  Participation Level: Active  Behavioral Response: NeatAlertAnxious  Type of Therapy: Individual Therapy  Treatment Goals addressed: Anxiety  Interventions: CBT  Summary: [name] is a 62 y.o. male who presents with anxiety and depression symptoms related to his diagnosis. [name] reports he feels he is doing a lot better, and reports having driven to his appointment today--which is a task he has not done in quite some time. He reports he also drove two other times in the last week. He reports his anxiety has improved, but still feels worried when his wife goes to work. He continues to report a lack of motivation or interest in doing things outside of the home, and often waits for his wife to return home before engaging in any activities. We discussed ways [name] could distract himself in an effort to avoid calling his wife while she is at [name] as doing a puzzle or watching TV. We discussed the primary goal as putting more time inbetween when he feels the urge to call her and when he actually does it. We discussed building on the smaller accomplishments, and how the rest will fall into place. [name] reports his behavior and anxiety was much improved during their beach trip, but soon returned to normal after returning home from the trip. We discussed the other people on the trip could have served as distraction for [name], further emphasizing the need for distraction techniques at his home. [name] was in agreement with this plan and information.   Suicidal/Homicidal: No  Therapist Response: We will continue to utilize CBT and DBT to manage [name]'s anxiety and depression symptoms. This week, I discussed the possibility of moving [name] to every other week sessions, and he stated he would need to discuss that with his wife first. We will discuss that idea at his next session and will also discuss how he felt the distraction  techniques assisted him throughout the week.   Plan: Return again in 1 week.   Diagnosis: Axis I: Bipolar, mixed    Axis II: No diagnosis    Heidi Dach, LCSW 04/08/2018

## 2018-04-16 ENCOUNTER — Ambulatory Visit: Payer: Medicare Other | Admitting: Psychiatry

## 2018-04-16 ENCOUNTER — Encounter: Payer: Self-pay | Admitting: Psychiatry

## 2018-04-16 VITALS — BP 144/82 | HR 104 | Ht 66.0 in | Wt 248.0 lb

## 2018-04-16 DIAGNOSIS — G4701 Insomnia due to medical condition: Secondary | ICD-10-CM

## 2018-04-16 DIAGNOSIS — F3160 Bipolar disorder, current episode mixed, unspecified: Secondary | ICD-10-CM

## 2018-04-16 DIAGNOSIS — F411 Generalized anxiety disorder: Secondary | ICD-10-CM

## 2018-04-16 MED ORDER — QUETIAPINE FUMARATE 50 MG PO TABS: 50.0000 mg | ORAL_TABLET | Freq: Every day | ORAL | 1 refills | Status: DC

## 2018-04-16 MED ORDER — QUETIAPINE FUMARATE 25 MG PO TABS: 25.0000 mg | ORAL_TABLET | Freq: Every morning | ORAL | 1 refills | Status: DC

## 2018-04-16 MED ORDER — OXCARBAZEPINE 300 MG PO TABS: 300.0000 mg | ORAL_TABLET | Freq: Two times a day (BID) | ORAL | 1 refills | Status: DC

## 2018-04-16 NOTE — Progress Notes (Signed)
BH MD OP Progress Note  04/16/2018 4:10 PM [name]  MRN:  [id number]  Chief Complaint: ' I am here for follow up." Chief Complaint    Follow-up     HPI: [name] is a 62 year old African-American male, lives in [place], has a history of bipolar disorder, secondary to brain injury, multiple medical problems including diabetes mellitus, hyperlipidemia, hypertension, history of DVT, presented to the clinic today for a follow-up visit.  Patient today reports he is currently making some progress on the current medication regimen.  He reports he has been able to cope with his anxiety symptoms better.  He reports he feels anxious usually in the morning when his wife leaves for work.  He reports he has been trying not to call her often and currently calls her only 2-3 times per day.  He reports he has been trying to drive around in town himself.  He was fearful of driving when he initially presented to [facility] research [facility].  He reports there has been an improvement and he actually drove himself today to the clinic today for the visit.  Patient reports sleep is improved.  He continues to be compliant with the Seroquel as well as Trileptal.  He has been taking the Seroquel in the morning only as needed.  He reports that continues to help him and he wants to keep with the same.  Patient denies any suicidality.  He denies any perceptual disturbances.  He continues to be alert, oriented to person place time and situation.  Patient reports he continues to be in psychotherapy sessions which are beneficial.   Visit Diagnosis:    ICD-10-CM   1. Bipolar I disorder, most recent episode mixed (HCC) F31.60 Oxcarbazepine (TRILEPTAL) 300 MG tablet    QUEtiapine (SEROQUEL) 25 MG tablet   secondary to head injury  2. GAD (generalized anxiety disorder) F41.1   3. Insomnia due to medical condition G47.01     Past Psychiatric History: Reviewed past psychiatric history from my progress note on 09/09/2017  Past  Medical History:  Past Medical History:  Diagnosis Date  . Bipolar disorder (HCC)   . Diabetes mellitus without complication (HCC)   . History of blood clots   . Hypertension     Past Surgical History:  Procedure Laterality Date  . BACK SURGERY    . CARPAL TUNNEL RELEASE Bilateral   . TONSILLECTOMY      Family Psychiatric History: I have reviewed family psychiatric history from my progress note on 09/09/2017  Family History:  Family History  Problem Relation Age of Onset  . Depression Father     Social History: Reviewed social history from my progress note on 09/09/2017 Social History   Socioeconomic History  . Marital status: Married    Spouse name: thelma  . Number of children: 0  . Years of education: Not on file  . Highest education level: Associate degree: occupational, Scientist, product/process development, or vocational program  Occupational History  . Not on file  Social Needs  . Financial resource strain: Not hard at all  . Food insecurity:    Worry: Never true    Inability: Never true  . Transportation needs:    Medical: No    Non-medical: No  Tobacco Use  . Smoking status: Never Smoker  . Smokeless tobacco: Never Used  Substance and Sexual Activity  . Alcohol use: Yes    Comment: social  . Drug use: No  . Sexual activity: Not on file  Lifestyle  . Physical activity:  Days per week: 0 days    Minutes per session: 0 min  . Stress: Not at all  Relationships  . Social connections:    Talks on phone: Not on file    Gets together: Not on file    Attends religious service: More than 4 times per year    Active member of club or organization: No    Attends meetings of clubs or organizations: Never    Relationship status: Married  Other Topics Concern  . Not on file  Social History Narrative  . Not on file    Allergies: No Known Allergies  Metabolic Disorder Labs: Lab Results  Component Value Date   HGBA1C 7.0 (H) 01/03/2016   No results found for: PROLACTIN No  results found for: CHOL, TRIG, HDL, CHOLHDL, VLDL, LDLCALC No results found for: TSH  Therapeutic Level Labs: Lab Results  Component Value Date   LITHIUM 1.24 (H) 01/04/2016   LITHIUM 1.51 (HH) 01/03/2016   No results found for: VALPROATE No components found for:  CBMZ  Current Medications: Current Outpatient Medications  Medication Sig Dispense Refill  . amLODipine (NORVASC) 5 MG tablet Take 1 tablet (5 mg total) by mouth daily. 30 tablet 2  . atorvastatin (LIPITOR) 80 MG tablet Take 80 mg by mouth at bedtime.    Marland Kitchen glipiZIDE (GLUCOTROL XL) 10 MG 24 hr tablet Take 10 mg by mouth daily with breakfast.     . hydrochlorothiazide (MICROZIDE) 12.5 MG capsule Take by mouth.    . losartan (COZAAR) 100 MG tablet Take by mouth.    . metFORMIN (GLUCOPHAGE-XR) 500 MG 24 hr tablet Take 1,000 mg by mouth at bedtime.     . Oxcarbazepine (TRILEPTAL) 300 MG tablet Take 1 tablet (300 mg total) by mouth 2 (two) times daily. 180 tablet 1  . QUEtiapine (SEROQUEL) 25 MG tablet Take 1 tablet (25 mg total) by mouth every morning. 90 tablet 1  . QUEtiapine (SEROQUEL) 50 MG tablet Take 1 tablet (50 mg total) by mouth at bedtime. 90 tablet 1  . rosuvastatin (CRESTOR) 20 MG tablet     . spironolactone (ALDACTONE) 100 MG tablet     . valsartan-hydrochlorothiazide (DIOVAN-HCT) 320-25 MG tablet     . warfarin (COUMADIN) 1 MG tablet Take 1 mg by mouth See admin instructions. Take 2 tablets (2 mg) on Monday and Thursday only along with 5 mg tablet to equal 7 mg total.    . warfarin (COUMADIN) 2 MG tablet Take by mouth.    . warfarin (COUMADIN) 5 MG tablet Take by mouth.     No current facility-administered medications for this visit.      Musculoskeletal: Strength & Muscle Tone: within normal limits Gait & Station: walks with walker Patient leans: Front  Psychiatric Specialty Exam: Review of Systems  Psychiatric/Behavioral: The patient is nervous/anxious (improving).   All other systems reviewed and are  negative.   Blood pressure (!) 144/82, pulse (!) 104, height 5\' 6"  (1.676 m), weight 248 lb (112.5 kg), SpO2 97 %.Body mass index is 40.03 kg/m.  General Appearance: Casual  Eye Contact:  Fair  Speech:  Clear and Coherent  Volume:  Normal  Mood:  Anxious improving  Affect:  Congruent  Thought Process:  Goal Directed and Descriptions of Associations: Intact  Orientation:  Full (Time, Place, and Person)  Thought Content: Logical   Suicidal Thoughts:  No  Homicidal Thoughts:  No  Memory:  Immediate;   Fair Recent;   Fair Remote;  Fair  Judgement:  Fair  Insight:  Good  Psychomotor Activity:  Normal  Concentration:  Concentration: Fair and Attention Span: Fair  Recall:  [employer] of Knowledge: Fair  Language: Fair  Akathisia:  No  Handed:  Right  AIMS (if indicated): 0  Assets:  Communication Skills Desire for Improvement Social Support Transportation  ADL's:  Intact  Cognition: WNL  Sleep:  improved   Screenings: AIMS     [facility] Visit [facility] 03/10/2018 in [facility]  AIMS Total Score  0       Assessment and Plan: [name] is a 62 year old African-American male who has a 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  Patient recently struggle with UTI as well as severe constipation which also contributed to some mood symptoms.  Patient however is currently making progress.  Will continue medication management as well as psychotherapy sessions.  Plan For bipolar disorder Continue Trileptal 300 mg p.o. twice daily Continue Seroquel 25 mg p.o. daily in the a.m. and 50 mg at bedtime.  Patient reports he has been taking the morning dose only as needed.  Discussed with him he can continue to do so.  Insomnia Continue Seroquel 50 mg p.o. Nightly.  Anxiety symptoms He will continue to work with his therapist.  Follow-up in clinic in 6 weeks or sooner if needed.  More than 50 % of the  time was spent for psychoeducation and supportive psychotherapy and care coordination.  This note was generated in part or whole with voice recognition software. Voice recognition is usually quite accurate but there are transcription errors that can and very often do occur. I apologize for any typographical errors that were not detected and corrected.           [name], MD 04/17/2018, 9:40 AM

## 2018-04-17 ENCOUNTER — Encounter: Payer: Self-pay | Admitting: Psychiatry

## 2018-05-18 ENCOUNTER — Ambulatory Visit: Payer: Medicare Other | Admitting: Licensed Clinical Social Worker

## 2018-05-20 ENCOUNTER — Ambulatory Visit: Payer: Medicare Other | Admitting: Psychiatry

## 2018-06-03 ENCOUNTER — Ambulatory Visit (INDEPENDENT_AMBULATORY_CARE_PROVIDER_SITE_OTHER): Payer: Medicare Other | Admitting: Psychiatry

## 2018-06-03 ENCOUNTER — Encounter: Payer: Self-pay | Admitting: Psychiatry

## 2018-06-03 VITALS — BP 123/75 | HR 82 | Ht 66.0 in | Wt 245.0 lb

## 2018-06-03 DIAGNOSIS — G4701 Insomnia due to medical condition: Secondary | ICD-10-CM | POA: Diagnosis not present

## 2018-06-03 DIAGNOSIS — F3160 Bipolar disorder, current episode mixed, unspecified: Secondary | ICD-10-CM

## 2018-06-03 DIAGNOSIS — F411 Generalized anxiety disorder: Secondary | ICD-10-CM

## 2018-06-03 NOTE — Progress Notes (Signed)
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  Patient currently reports he is making progress on the current medication regimen.  He denies any significant mood lability.  He reports his anxiety symptoms is improving.  He reports he is better able to cope with his anxiety symptoms when his wife leaves for work.  He has been limiting the calls that he makes to her when she is away.  Pt reports he has been able to drive more and his anxiety about driving is improving.  He continues to be in psychotherapy sessions which are beneficial  Patient reports he is compliant with his medications as prescribed.  He denies side effects.  Patient reports he was recently diagnosed with another UTI and is currently on antibiotic-ciprofloxacin.  He reports he was advised to stay hydrated.  Patient appears to be alert, oriented and denies any other concerns at this time.  Visit Diagnosis:    ICD-10-CM   1. Bipolar I disorder, most recent episode mixed (HCC) F31.60    improving  2. GAD (generalized anxiety disorder) F41.1    improving  3. Insomnia due to medical condition G47.01    improved    Past Psychiatric History: I have reviewed past psychiatric history from my progress note on 09/09/2017  Past Medical History:  Past Medical History:  Diagnosis Date  . Bipolar disorder (HCC)   . Diabetes mellitus without complication (HCC)   . History of blood clots   . Hypertension     Past Surgical History:  Procedure Laterality Date  . BACK SURGERY    . CARPAL TUNNEL RELEASE Bilateral   . TONSILLECTOMY       Family Psychiatric History: Reviewed family psychiatric history from my progress note on 09/09/2017.  Family History:  Family History  Problem Relation Age of Onset  . Depression Father     Social History: Reviewed social history from my progress note on 09/09/2017 Social History   Socioeconomic History  . Marital status: Married    Spouse name: thelma  . Number of children: 0  . Years of education: Not on file  . Highest education level: Associate degree: occupational, Scientist, product/process developmenttechnical, or vocational program  Occupational History  . Not on file  Social Needs  . Financial resource strain: Not hard at all  . Food insecurity:    Worry: Never true    Inability: Never true  . Transportation needs:    Medical: No    Non-medical: No  Tobacco Use  . Smoking status: Never Smoker  . Smokeless tobacco: Never Used  Substance and Sexual Activity  . Alcohol use: Yes    Comment: social  . Drug use: No  . Sexual activity: Not on file  Lifestyle  . Physical activity:    Days per week: 0 days    Minutes per session: 0 min  . Stress: Not at all  Relationships  . Social connections:    Talks on phone: Not on file    Gets together: Not on file    Attends religious service: More than 4 times per year    Active member  of club or organization: No    Attends meetings of clubs or organizations: Never    Relationship status: Married  Other Topics Concern  . Not on file  Social History Narrative  . Not on file    Allergies: No Known Allergies  Metabolic Disorder Labs: Lab Results  Component Value Date   HGBA1C 7.0 (H) 01/03/2016   No results found for: PROLACTIN No results found for: CHOL, TRIG, HDL, CHOLHDL, VLDL, LDLCALC No results found for: TSH  Therapeutic Level Labs: Lab Results  Component Value Date   LITHIUM 1.24 (H) 01/04/2016   LITHIUM 1.51 (HH) 01/03/2016   No results found for: VALPROATE No components found for:  CBMZ  Current Medications: Current Outpatient  Medications  Medication Sig Dispense Refill  . amLODipine (NORVASC) 5 MG tablet Take 1 tablet (5 mg total) by mouth daily. 30 tablet 2  . atorvastatin (LIPITOR) 80 MG tablet Take 80 mg by mouth at bedtime.    . ciprofloxacin (CIPRO) 500 MG tablet Take 500 mg by mouth 2 (two) times daily. for 7 days  0  . enoxaparin (LOVENOX) 120 MG/0.8ML injection INJECT ONE SYRINGEFUL SUBCUTANEOUSLY TWICE DAILY  0  . glipiZIDE (GLUCOTROL XL) 10 MG 24 hr tablet Take 10 mg by mouth daily with breakfast.     . hydrochlorothiazide (MICROZIDE) 12.5 MG capsule Take by mouth.    . losartan (COZAAR) 100 MG tablet Take by mouth.    . metFORMIN (GLUCOPHAGE-XR) 500 MG 24 hr tablet Take 1,000 mg by mouth at bedtime.     . Oxcarbazepine (TRILEPTAL) 300 MG tablet Take 1 tablet (300 mg total) by mouth 2 (two) times daily. 180 tablet 1  . QUEtiapine (SEROQUEL) 25 MG tablet Take 1 tablet (25 mg total) by mouth every morning. 90 tablet 1  . QUEtiapine (SEROQUEL) 50 MG tablet Take 1 tablet (50 mg total) by mouth at bedtime. 90 tablet 1  . rosuvastatin (CRESTOR) 20 MG tablet     . spironolactone (ALDACTONE) 100 MG tablet     . valsartan-hydrochlorothiazide (DIOVAN-HCT) 320-25 MG tablet     . warfarin (COUMADIN) 1 MG tablet Take 1 mg by mouth See admin instructions. Take 2 tablets (2 mg) on Monday and Thursday only along with 5 mg tablet to equal 7 mg total.    . warfarin (COUMADIN) 2 MG tablet Take by mouth.    . warfarin (COUMADIN) 5 MG tablet Take by mouth.     No current facility-administered medications for this visit.      Musculoskeletal: Strength & Muscle Tone: within normal limits Gait & Station: walks with walker Patient leans: Front  Psychiatric Specialty Exam: Review of Systems  Psychiatric/Behavioral: Negative for depression. The patient is not nervous/anxious.   All other systems reviewed and are negative.   Blood pressure 123/75, pulse 82, height 5\' 6"  (1.676 m), weight 245 lb (111.1 kg), SpO2 98 %.Body  mass index is 39.54 kg/m.  General Appearance: Casual  Eye Contact:  Fair  Speech:  Clear and Coherent  Volume:  Normal  Mood:  Euthymic  Affect:  Congruent  Thought Process:  Goal Directed and Descriptions of Associations: Intact  Orientation:  Full (Time, Place, and Person)  Thought Content: Logical   Suicidal Thoughts:  No  Homicidal Thoughts:  No  Memory:  Immediate;   Fair Recent;   Fair Remote;   Fair  Judgement:  Fair  Insight:  Fair  Psychomotor Activity:  Normal  Concentration:  Concentration: Fair and Attention Span: Fair  Recall:  Jennelle Human of Knowledge: Fair  Language: Fair  Akathisia:  No  Handed:  Right  AIMS (if indicated): denies tremors, rigidity,stiffness  Assets:  Communication Skills Desire for Improvement Social Support  ADL's:  Intact  Cognition: WNL  Sleep:  Fair   Screenings: AIMS     [facility]/10/2018 in [facility]  AIMS Total Score  0       Assessment and Plan: Lizandro is a 27 old African-American male who has a 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  Patient continues to struggle with recurrent UTIs.  Patient however reports his mood symptoms are stable.  He will continue psychotherapy sessions.  Plan For bipolar disorder Trileptal 300 mg p.o. twice daily Continue Seroquel 50 mg at bedtime. He also has seroquel 25 mg p.o. daily in the a.m., however he has not been taking it.  Discussed with patient to take it as needed.    Insomnia Seroquel 50 mg p.o. nightly  For anxiety symptoms He will continue to work with his therapist.  Follow-up in clinic in 1 month or sooner if needed.  More than 50 % of the time was spent for psychoeducation and supportive psychotherapy and care coordination.  This note was generated in part or whole with voice recognition software. Voice recognition is usually quite accurate but there are  transcription errors that can and very often do occur. I apologize for any typographical errors that were not detected and corrected.       [name], MD 06/03/2018, 5:21 PM

## 2018-07-02 ENCOUNTER — Ambulatory Visit: Payer: Medicare Other | Admitting: Psychiatry

## 2018-07-08 ENCOUNTER — Ambulatory Visit: Payer: Medicare Other | Admitting: Licensed Clinical Social Worker

## 2018-10-30 ENCOUNTER — Other Ambulatory Visit: Payer: Self-pay | Admitting: Psychiatry

## 2018-10-30 DIAGNOSIS — F3160 Bipolar disorder, current episode mixed, unspecified: Secondary | ICD-10-CM

## 2019-01-07 ENCOUNTER — Other Ambulatory Visit: Payer: Self-pay

## 2019-01-07 ENCOUNTER — Encounter: Payer: Self-pay | Admitting: Psychiatry

## 2019-01-07 ENCOUNTER — Ambulatory Visit (INDEPENDENT_AMBULATORY_CARE_PROVIDER_SITE_OTHER): Payer: Medicare Other | Admitting: Psychiatry

## 2019-01-07 DIAGNOSIS — F3160 Bipolar disorder, current episode mixed, unspecified: Secondary | ICD-10-CM | POA: Diagnosis not present

## 2019-01-07 DIAGNOSIS — F411 Generalized anxiety disorder: Secondary | ICD-10-CM | POA: Diagnosis not present

## 2019-01-07 DIAGNOSIS — G4701 Insomnia due to medical condition: Secondary | ICD-10-CM | POA: Diagnosis not present

## 2019-01-07 MED ORDER — QUETIAPINE FUMARATE 100 MG PO TABS: 100.0000 mg | ORAL_TABLET | Freq: Every day | ORAL | 1 refills | Status: DC

## 2019-01-07 MED ORDER — QUETIAPINE FUMARATE 50 MG PO TABS: 50.0000 mg | ORAL_TABLET | Freq: Two times a day (BID) | ORAL | 1 refills | Status: DC | PRN

## 2019-01-07 MED ORDER — OXCARBAZEPINE 300 MG PO TABS: 300.0000 mg | ORAL_TABLET | Freq: Three times a day (TID) | ORAL | 1 refills | Status: DC

## 2019-01-07 NOTE — Progress Notes (Signed)
Virtual Visit via Video Note  I connected with Darnelle MaffucciJoseph Sumners on 01/07/19 at  3:00 PM EDT by a video enabled telemedicine application and verified that I am speaking with the correct person using two identifiers.   I discussed the limitations of evaluation and management by telemedicine and the availability of in person appointments. The patient expressed understanding and agreed to proceed.   I discussed the assessment and treatment plan with the patient. The patient was provided an opportunity to ask questions and all were answered. The patient agreed with the plan and demonstrated an understanding of the instructions.   The patient was advised to call back or seek an in-person evaluation if the symptoms worsen or if the condition fails to improve as anticipated.  BH MD OP Progress Note  01/07/2019 6:33 PM Darnelle MaffucciJoseph Boatman  MRN:  [id number]  Chief Complaint:  Chief Complaint    Follow-up     HPI: [name] is a 63 year old African-American male, lives in [place], has a history of bipolar disorder, GAD, insomnia, history of DVT, hyperlipidemia, hypertension, diabetes melitis was evaluated by telemedicine today.  Collateral information was obtained from wife [name] since patient is a limited historian.  Per [name] patient continues to have worsening mood lability.  He seems to have pressured speech, and is very nervous.  He does not want to leave the house anymore since he is anxious and on edge all the time.  He also seems to have sleep issues.  She has been trying to give him Seroquel 25 mg during the day as needed however it does not seem to help much and it does not make him drowsy.  Patient today appeared to be calm, alert and oriented.  He reported that he is doing fine.  He however does report some sleep issues on and off.  He reports he is compliant with his Trileptal and the Seroquel at night.  He denies any side effects.  Patient with history of recurrent UTIs,  discussed with [name] ,wife as well as patient to reach out to primary care to do another urine analysis.  Also discussed increasing his mood stabilizer today.  Discussed with patient to start psychotherapy sessions with therapist again since he was doing much better when he was still in therapy.   Visit Diagnosis:    ICD-10-CM   1. Bipolar I disorder, most recent episode mixed (HCC)  F31.60 QUEtiapine (SEROQUEL) 50 MG tablet    QUEtiapine (SEROQUEL) 100 MG tablet    Oxcarbazepine (TRILEPTAL) 300 MG tablet  2. GAD (generalized anxiety disorder)  F41.1 QUEtiapine (SEROQUEL) 50 MG tablet  3. Insomnia due to medical condition  G47.01 QUEtiapine (SEROQUEL) 50 MG tablet    Past Psychiatric History: I have reviewed past psychiatric history from my progress note on 09/09/2017.  Past Medical History:  Past Medical History:  Diagnosis Date  . Bipolar disorder (HCC)   . Diabetes mellitus without complication (HCC)   . History of blood clots   . Hypertension     Past Surgical History:  Procedure Laterality Date  . BACK SURGERY    . CARPAL TUNNEL RELEASE Bilateral   . TONSILLECTOMY      Family Psychiatric History: I have reviewed family history from my progress note on 09/09/2017.  Family History:  Family History  Problem Relation Age of Onset  . Depression Father     Social History: I have reviewed social history from my progress note on 09/09/2017. Social History   Socioeconomic History  . Marital  status: Married    Spouse name: thelma  . Number of children: 0  . Years of education: Not on file  . Highest education level: Associate degree: occupational, Scientist, product/process developmenttechnical, or vocational program  Occupational History  . Not on file  Social Needs  . Financial resource strain: Not hard at all  . Food insecurity    Worry: Never true    Inability: Never true  . Transportation needs    Medical: No    Non-medical: No  Tobacco Use  . Smoking status: Never Smoker  . Smokeless tobacco:  Never Used  Substance and Sexual Activity  . Alcohol use: Yes    Comment: social  . Drug use: No  . Sexual activity: Not on file  Lifestyle  . Physical activity    Days per week: 0 days    Minutes per session: 0 min  . Stress: Not at all  Relationships  . Social Musicianconnections    Talks on phone: Not on file    Gets together: Not on file    Attends religious service: More than 4 times per year    Active member of club or organization: No    Attends meetings of clubs or organizations: Never    Relationship status: Married  Other Topics Concern  . Not on file  Social History Narrative  . Not on file    Allergies: No Known Allergies  Metabolic Disorder Labs: Lab Results  Component Value Date   HGBA1C 7.0 (H) 01/03/2016   No results found for: PROLACTIN No results found for: CHOL, TRIG, HDL, CHOLHDL, VLDL, LDLCALC No results found for: TSH  Therapeutic Level Labs: Lab Results  Component Value Date   LITHIUM 1.24 (H) 01/04/2016   LITHIUM 1.51 (HH) 01/03/2016   No results found for: VALPROATE No components found for:  CBMZ  Current Medications: Current Outpatient Medications  Medication Sig Dispense Refill  . amLODipine (NORVASC) 10 MG tablet     . amLODipine (NORVASC) 5 MG tablet Take 1 tablet (5 mg total) by mouth daily. 30 tablet 2  . atorvastatin (LIPITOR) 80 MG tablet Take 80 mg by mouth at bedtime.    . ciprofloxacin (CIPRO) 500 MG tablet Take 500 mg by mouth 2 (two) times daily. for 7 days  0  . ELIQUIS 5 MG TABS tablet TAKE TWO TABLETS BY MOUTH TWICE DAILY FOR 7 DAYS. THEN DECREASE TO TAKE ONE TABLET BY MOUTH TWICE DAILY THEREAFTER    . enoxaparin (LOVENOX) 120 MG/0.8ML injection INJECT ONE SYRINGEFUL SUBCUTANEOUSLY TWICE DAILY  0  . glipiZIDE (GLUCOTROL XL) 10 MG 24 hr tablet Take 10 mg by mouth daily with breakfast.     . hydrochlorothiazide (HYDRODIURIL) 25 MG tablet Take 25 mg by mouth daily.    . hydrochlorothiazide (MICROZIDE) 12.5 MG capsule Take by mouth.     . losartan (COZAAR) 100 MG tablet Take by mouth.    . metFORMIN (GLUCOPHAGE-XR) 500 MG 24 hr tablet Take 1,000 mg by mouth at bedtime.     Marland Kitchen. olmesartan (BENICAR) 40 MG tablet Take 40 mg by mouth daily.    Marland Kitchen. olmesartan-hydrochlorothiazide (BENICAR HCT) 40-25 MG tablet     . Oxcarbazepine (TRILEPTAL) 300 MG tablet Take 1 tablet (300 mg total) by mouth 3 (three) times daily. 90 tablet 1  . QUEtiapine (SEROQUEL) 100 MG tablet Take 1 tablet (100 mg total) by mouth at bedtime. 30 tablet 1  . QUEtiapine (SEROQUEL) 50 MG tablet Take 1 tablet (50 mg total) by mouth 2 (  two) times daily as needed. For severe anxiety and agitation 60 tablet 1  . rosuvastatin (CRESTOR) 20 MG tablet     . spironolactone (ALDACTONE) 100 MG tablet     . valsartan-hydrochlorothiazide (DIOVAN-HCT) 320-25 MG tablet     . warfarin (COUMADIN) 1 MG tablet Take 1 mg by mouth See admin instructions. Take 2 tablets (2 mg) on Monday and Thursday only along with 5 mg tablet to equal 7 mg total.    . warfarin (COUMADIN) 2 MG tablet Take by mouth.    . warfarin (COUMADIN) 5 MG tablet Take by mouth.     No current facility-administered medications for this visit.      Musculoskeletal: Strength & Muscle Tone: UTA Gait & Station: With walker Patient leans: N/A  Psychiatric Specialty Exam: Review of Systems  Psychiatric/Behavioral: The patient is nervous/anxious and has insomnia.   All other systems reviewed and are negative.   There were no vitals taken for this visit.There is no height or weight on file to calculate BMI.  General Appearance: Casual  Eye Contact:  Fair  Speech:  Normal Rate  Volume:  Normal  Mood:  Anxious  Affect:  Congruent  Thought Process:  Goal Directed and Descriptions of Associations: Intact  Orientation:  Full (Time, Place, and Person)  Thought Content: Logical   Suicidal Thoughts:  No  Homicidal Thoughts:  No  Memory:  Immediate;   Fair Recent;   Fair Remote;   Fair  Judgement:  Fair   Insight:  Fair  Psychomotor Activity:  Normal  Concentration:  Concentration: Fair and Attention Span: Fair  Recall:  FiservFair  Fund of Knowledge: Fair  Language: Fair  Akathisia:  No  Handed:  Right  AIMS (if indicated):denies tremors, rigidity  Assets:  Communication Skills Desire for Improvement Housing Social Support  ADL's:  Intact  Cognition: WNL  Sleep:  restless   Screenings: AIMS     [facility] Visit from 03/10/2018 in [facility]  AIMS Total Score  0       Assessment and Plan: [name] is a 63 year old African-American male who has a 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  Patient with history of recurrent UTIs, per wife is currently having mood lability and sleep issues.  Patient will continue to need medication readjustment as well as discussed restarting psychotherapy sessions.  Plan For bipolar disorder- unstable Increase Trileptal to 300 mg p.o. 3 times daily. Increase Seroquel to 50 mg p.o. twice daily PRN for severe anxiety and Seroquel 100 mg p.o. nightly  For insomnia-restless Increase Seroquel to 100 mg p.o. nightly.  For GAD- restless Seroquel will help. Patient to restart psychotherapy sessions with Ms. Heidi DachKelsey Craig.  He has been noncompliant.  Collateral information was obtained from wife-[name] who reports patient is anxious and has worsening mood symptoms.  Discussed with her to also get a urine analysis to make sure he does not have a recurrent UTI.  Follow-up in clinic in 3 to 4 weeks or sooner if needed.  September 1 at 2:30 PM  I have spent atleast 15 minutes non face to face with patient today. More than 50 % of the time was spent for psychoeducation and supportive psychotherapy and care coordination.  This note was generated in part or whole with voice recognition software. Voice recognition is usually quite accurate but there are transcription errors that can and very  often do occur. I apologize for any typographical errors that were  not detected and corrected.           Ursula Alert, MD 01/07/2019, 6:33 PM

## 2019-03-02 ENCOUNTER — Ambulatory Visit (INDEPENDENT_AMBULATORY_CARE_PROVIDER_SITE_OTHER): Payer: Self-pay | Admitting: Psychiatry

## 2019-03-02 ENCOUNTER — Other Ambulatory Visit: Payer: Self-pay

## 2019-03-02 DIAGNOSIS — Z5329 Procedure and treatment not carried out because of patient's decision for other reasons: Secondary | ICD-10-CM

## 2019-03-02 NOTE — Progress Notes (Signed)
NO RESPONSE TO CALLS

## 2019-03-10 ENCOUNTER — Other Ambulatory Visit: Payer: Self-pay | Admitting: Psychiatry

## 2019-03-10 DIAGNOSIS — F3160 Bipolar disorder, current episode mixed, unspecified: Secondary | ICD-10-CM

## 2019-03-15 ENCOUNTER — Telehealth: Payer: Self-pay

## 2019-03-15 DIAGNOSIS — F3160 Bipolar disorder, current episode mixed, unspecified: Secondary | ICD-10-CM

## 2019-03-15 DIAGNOSIS — G4701 Insomnia due to medical condition: Secondary | ICD-10-CM

## 2019-03-15 DIAGNOSIS — F411 Generalized anxiety disorder: Secondary | ICD-10-CM

## 2019-03-15 MED ORDER — OXCARBAZEPINE 300 MG PO TABS: 300.0000 mg | ORAL_TABLET | Freq: Three times a day (TID) | ORAL | 1 refills | Status: DC

## 2019-03-15 MED ORDER — QUETIAPINE FUMARATE 100 MG PO TABS: 100.0000 mg | ORAL_TABLET | Freq: Every day | ORAL | 1 refills | Status: DC

## 2019-03-15 MED ORDER — QUETIAPINE FUMARATE 50 MG PO TABS: 50.0000 mg | ORAL_TABLET | Freq: Two times a day (BID) | ORAL | 1 refills | Status: DC | PRN

## 2019-03-15 NOTE — Telephone Encounter (Signed)
Sent medications to pharmacy  

## 2019-03-15 NOTE — Telephone Encounter (Signed)
pt wife called states pt needs enough medication to get to his next appt. pt wife going out of town and he will need medications while she is gone.

## 2019-03-31 ENCOUNTER — Ambulatory Visit: Payer: Medicare Other | Admitting: Psychiatry

## 2019-03-31 ENCOUNTER — Other Ambulatory Visit: Payer: Self-pay

## 2019-03-31 ENCOUNTER — Ambulatory Visit (INDEPENDENT_AMBULATORY_CARE_PROVIDER_SITE_OTHER): Payer: Medicare Other | Admitting: Psychiatry

## 2019-03-31 DIAGNOSIS — F3160 Bipolar disorder, current episode mixed, unspecified: Secondary | ICD-10-CM

## 2019-03-31 NOTE — Progress Notes (Signed)
WIFE PICKED UP PHONE AND REPORTED SHE WANTED APPOINTMENT RESCHEDULED.

## 2019-07-17 ENCOUNTER — Other Ambulatory Visit: Payer: Self-pay | Admitting: Psychiatry

## 2019-07-17 DIAGNOSIS — F3160 Bipolar disorder, current episode mixed, unspecified: Secondary | ICD-10-CM

## 2019-09-08 ENCOUNTER — Other Ambulatory Visit: Payer: Self-pay | Admitting: Psychiatry

## 2019-09-08 DIAGNOSIS — F3160 Bipolar disorder, current episode mixed, unspecified: Secondary | ICD-10-CM

## 2019-09-16 ENCOUNTER — Other Ambulatory Visit: Payer: Self-pay | Admitting: Psychiatry

## 2019-09-16 DIAGNOSIS — F3160 Bipolar disorder, current episode mixed, unspecified: Secondary | ICD-10-CM

## 2019-09-21 ENCOUNTER — Other Ambulatory Visit: Payer: Self-pay | Admitting: Psychiatry

## 2019-09-21 DIAGNOSIS — G4701 Insomnia due to medical condition: Secondary | ICD-10-CM

## 2019-09-21 DIAGNOSIS — F3160 Bipolar disorder, current episode mixed, unspecified: Secondary | ICD-10-CM

## 2019-09-21 DIAGNOSIS — F411 Generalized anxiety disorder: Secondary | ICD-10-CM

## 2019-09-27 ENCOUNTER — Other Ambulatory Visit: Payer: Self-pay | Admitting: Psychiatry

## 2019-09-27 DIAGNOSIS — F3160 Bipolar disorder, current episode mixed, unspecified: Secondary | ICD-10-CM

## 2019-09-27 DIAGNOSIS — G4701 Insomnia due to medical condition: Secondary | ICD-10-CM

## 2019-09-27 DIAGNOSIS — F411 Generalized anxiety disorder: Secondary | ICD-10-CM

## 2019-09-27 NOTE — Telephone Encounter (Signed)
Patient's last appointment with writer was 01/07/2019.  Patient missed another appointment sometime in September and never made another appointment to be seen.  Will not refill medications without evaluating this patient.

## 2019-09-29 ENCOUNTER — Telehealth (HOSPITAL_COMMUNITY): Payer: Self-pay

## 2019-09-29 DIAGNOSIS — F3160 Bipolar disorder, current episode mixed, unspecified: Secondary | ICD-10-CM

## 2019-09-29 NOTE — Telephone Encounter (Signed)
Can you check with pt if he is still taking these medications? I don't see any prescription sent after 07/2019. Thanks

## 2019-09-29 NOTE — Telephone Encounter (Signed)
This is a patient of Dr. Elvera Maria. Patient called requesting a refill on his Oxcarbazepine 300mg  and his Quetiapine 100mg  and Quetiapine 50mg  to be sent to [facility] on [address] in [place]. Patient has not been seen since 03/31/19. I informed patient appointment needs to be made before refills can be sent. Thank you.

## 2019-10-14 ENCOUNTER — Telehealth: Payer: Self-pay

## 2019-10-14 DIAGNOSIS — F3160 Bipolar disorder, current episode mixed, unspecified: Secondary | ICD-10-CM

## 2019-10-14 MED ORDER — OXCARBAZEPINE 300 MG PO TABS: 300.0000 mg | ORAL_TABLET | Freq: Three times a day (TID) | ORAL | 0 refills | Status: DC

## 2019-10-14 NOTE — Telephone Encounter (Signed)
spoke with thelma and told her that pt should have enough of the quetiapine and that i will ask dr. Elna Breslow to send in enough to get to appt for the trileptal..  she stated that pt is suppose to take 3 times a day but she only gives him twice a day because he stated when he was taken 3 that he did not feel well.   She was reminded that before any medication changes like that she needs to contact doctor and let them know.

## 2019-10-14 NOTE — Telephone Encounter (Signed)
according to the pharamcy the quetiapine and the trileptal was last written by a dr. Delfin Edis at [facility] [phone] the quetiapine was 30day supply each and the trileptal only a 10day supply.

## 2019-10-14 NOTE — Telephone Encounter (Signed)
I have sent Trileptal to the pharmacy today.

## 2019-10-14 NOTE — Telephone Encounter (Signed)
received a call stating that patient needed a refill on the quetiapine 100mg  and 50mg  and also on the trileptal300mg .  she was told that the medication had not been refill by dr. in a few months and that either someone else was giveing medication or he was not taking like he is suppose to.  She stated that dr. has been refilling.  I told her that I would have to call pharmacy and confirm because the system is not showing where it has been refill for seroquel 100mg  and 50mg  since sept of last year and the trileptal in jan of 21.    I will need to call pharamcy and confirm and will call her back.

## 2019-10-18 NOTE — Telephone Encounter (Signed)
Spoke with [employer]. Patient has enough medication until upcoming appointment. Patient was in the hospital and then rehab and was getting his medication taken care of at both places.

## 2019-10-25 ENCOUNTER — Telehealth: Payer: Self-pay

## 2019-10-25 ENCOUNTER — Encounter: Payer: Self-pay | Admitting: Psychiatry

## 2019-10-25 ENCOUNTER — Other Ambulatory Visit: Payer: Self-pay

## 2019-10-25 ENCOUNTER — Telehealth (INDEPENDENT_AMBULATORY_CARE_PROVIDER_SITE_OTHER): Payer: Medicare Other | Admitting: Psychiatry

## 2019-10-25 DIAGNOSIS — F3178 Bipolar disorder, in full remission, most recent episode mixed: Secondary | ICD-10-CM

## 2019-10-25 DIAGNOSIS — Z9111 Patient's noncompliance with dietary regimen: Secondary | ICD-10-CM | POA: Insufficient documentation

## 2019-10-25 DIAGNOSIS — F411 Generalized anxiety disorder: Secondary | ICD-10-CM | POA: Diagnosis not present

## 2019-10-25 DIAGNOSIS — Z91199 Patient's noncompliance with other medical treatment and regimen due to unspecified reason: Secondary | ICD-10-CM

## 2019-10-25 DIAGNOSIS — G4701 Insomnia due to medical condition: Secondary | ICD-10-CM

## 2019-10-25 DIAGNOSIS — Z79899 Other long term (current) drug therapy: Secondary | ICD-10-CM | POA: Diagnosis not present

## 2019-10-25 MED ORDER — QUETIAPINE FUMARATE 50 MG PO TABS: 50.0000 mg | ORAL_TABLET | Freq: Two times a day (BID) | ORAL | 1 refills | Status: DC | PRN

## 2019-10-25 MED ORDER — OXCARBAZEPINE 300 MG PO TABS: 300.0000 mg | ORAL_TABLET | Freq: Three times a day (TID) | ORAL | 1 refills | Status: DC

## 2019-10-25 MED ORDER — QUETIAPINE FUMARATE 100 MG PO TABS: 100.0000 mg | ORAL_TABLET | Freq: Every day | ORAL | 1 refills | Status: DC

## 2019-10-25 NOTE — Telephone Encounter (Signed)
lab work orders mailed out  

## 2019-10-25 NOTE — Progress Notes (Signed)
Provider Location : [facility] Patient Location : Home  Virtual Visit via Telephone Note  I connected with [name] on 10/25/19 at 11:40 AM EDT by telephone and verified that I am speaking with the correct person using two identifiers.   I discussed the limitations, risks, security and privacy concerns of performing an evaluation and management service by telephone and the availability of in person appointments. I also discussed with the patient that there may be a patient responsible charge related to this service. The patient expressed understanding and agreed to proceed.     I discussed the assessment and treatment plan with the patient. The patient was provided an opportunity to ask questions and all were answered. The patient agreed with the plan and demonstrated an understanding of the instructions.   The patient was advised to call back or seek an in-person evaluation if the symptoms worsen or if the condition fails to improve as anticipated.   BH MD OP Progress Note  10/25/2019 12:05 PM [name]  MRN:  [id number]  Chief Complaint:  Chief Complaint    Follow-up     HPI: [name] is a 64 year old African-American male lives in [place], has a history of bipolar disorder, GAD, insomnia, history of DVT, hyperlipidemia, hypertension, diabetes melitis was evaluated by phone today.  Patient was last evaluated by writer in July 2020. Patient at that time was advised to return in a few weeks however he has been noncompliant.  Patient today reports his mood symptoms are stable and that is why he decided to not to return for a sooner appointment. He reports he is currently taking Seroquel as well as Trileptal. He reports he wants to stay on the same dosage.  He reports sleep is good.  He reports appetite is fair.  Patient denies any suicidality, homicidality or perceptual disturbances.  He continues to struggle with driving which does make him anxious. However  otherwise he is doing okay.  I have reviewed recent telephone note for CMA-patient was prescribed Seroquel by another provider for 10 days. This was done since patient was noncompliant with follow-up appointments here and had ran out of medications.  Patient denies any other concerns today. Visit Diagnosis:    ICD-10-CM   1. Bipolar disorder, in full remission, most recent episode mixed (HCC)  F31.78 Oxcarbazepine (TRILEPTAL) 300 MG tablet    QUEtiapine (SEROQUEL) 100 MG tablet    QUEtiapine (SEROQUEL) 50 MG tablet    CBC With Differential  2. GAD (generalized anxiety disorder)  F41.1 QUEtiapine (SEROQUEL) 50 MG tablet  3. Insomnia due to medical condition  G47.01 QUEtiapine (SEROQUEL) 50 MG tablet  4. High risk medication use  Z79.899 CBC With Differential    Comprehensive metabolic panel    TSH    Lipid panel    Hemoglobin A1C    Prolactin  5. Noncompliance with treatment plan  Z91.11     Past Psychiatric History: I have reviewed past psychiatric history from my progress note on 09/09/2017  Past Medical History:  Past Medical History:  Diagnosis Date  . Bipolar disorder (HCC)   . Diabetes mellitus without complication (HCC)   . History of blood clots   . Hypertension     Past Surgical History:  Procedure Laterality Date  . BACK SURGERY    . CARPAL TUNNEL RELEASE Bilateral   . TONSILLECTOMY      Family Psychiatric History: I have reviewed family psychiatric history from my progress note on 09/09/2017  Family History:  Family History  Problem Relation Age of Onset  . Depression Father     Social History: I have reviewed social history from my progress note on 09/09/2017 Social History   Socioeconomic History  . Marital status: Married    Spouse name: thelma  . Number of children: 0  . Years of education: Not on file  . Highest education level: Associate degree: occupational, Scientist, product/process development, or vocational program  Occupational History  . Not on file  Tobacco Use  .  Smoking status: Never Smoker  . Smokeless tobacco: Never Used  Substance and Sexual Activity  . Alcohol use: Yes    Comment: social  . Drug use: No  . Sexual activity: Not on file  Other Topics Concern  . Not on file  Social History Narrative  . Not on file   Social Determinants of Health   Financial Resource Strain:   . Difficulty of Paying Living Expenses:   Food Insecurity:   . Worried About Programme researcher, broadcasting/film/video in the Last Year:   . Barista in the Last Year:   Transportation Needs:   . Freight forwarder (Medical):   Marland Kitchen Lack of Transportation (Non-Medical):   Physical Activity:   . Days of Exercise per Week:   . Minutes of Exercise per Session:   Stress:   . Feeling of Stress :   Social Connections:   . Frequency of Communication with Friends and Family:   . Frequency of Social Gatherings with Friends and Family:   . Attends Religious Services:   . Active Member of Clubs or Organizations:   . Attends Banker Meetings:   Marland Kitchen Marital Status:     Allergies: No Known Allergies  Metabolic Disorder Labs: Lab Results  Component Value Date   HGBA1C 7.0 (H) 01/03/2016   No results found for: PROLACTIN No results found for: CHOL, TRIG, HDL, CHOLHDL, VLDL, LDLCALC No results found for: TSH  Therapeutic Level Labs: Lab Results  Component Value Date   LITHIUM 1.24 (H) 01/04/2016   LITHIUM 1.51 (HH) 01/03/2016   No results found for: VALPROATE No components found for:  CBMZ  Current Medications: Current Outpatient Medications  Medication Sig Dispense Refill  . amLODipine (NORVASC) 10 MG tablet     . amLODipine (NORVASC) 5 MG tablet Take 1 tablet (5 mg total) by mouth daily. 30 tablet 2  . atorvastatin (LIPITOR) 80 MG tablet Take 80 mg by mouth at bedtime.    . ciprofloxacin (CIPRO) 500 MG tablet Take 500 mg by mouth 2 (two) times daily. for 7 days  0  . ELIQUIS 5 MG TABS tablet TAKE TWO TABLETS BY MOUTH TWICE DAILY FOR 7 DAYS. THEN DECREASE TO  TAKE ONE TABLET BY MOUTH TWICE DAILY THEREAFTER    . enoxaparin (LOVENOX) 120 MG/0.8ML injection INJECT ONE SYRINGEFUL SUBCUTANEOUSLY TWICE DAILY  0  . glipiZIDE (GLUCOTROL XL) 10 MG 24 hr tablet Take 10 mg by mouth daily with breakfast.     . hydrochlorothiazide (HYDRODIURIL) 25 MG tablet Take 25 mg by mouth daily.    . hydrochlorothiazide (MICROZIDE) 12.5 MG capsule Take by mouth.    . losartan (COZAAR) 100 MG tablet Take by mouth.    . metFORMIN (GLUCOPHAGE-XR) 500 MG 24 hr tablet Take 1,000 mg by mouth at bedtime.     Marland Kitchen olmesartan (BENICAR) 40 MG tablet Take 40 mg by mouth daily.    Marland Kitchen olmesartan-hydrochlorothiazide (BENICAR HCT) 40-25 MG tablet     . Oxcarbazepine (TRILEPTAL) 300  MG tablet Take 1 tablet (300 mg total) by mouth 3 (three) times daily. 90 tablet 1  . QUEtiapine (SEROQUEL) 100 MG tablet Take 1 tablet (100 mg total) by mouth at bedtime. 30 tablet 1  . QUEtiapine (SEROQUEL) 50 MG tablet Take 1 tablet (50 mg total) by mouth 2 (two) times daily as needed. For severe anxiety and agitation 60 tablet 1  . rosuvastatin (CRESTOR) 20 MG tablet     . spironolactone (ALDACTONE) 100 MG tablet     . traMADol (ULTRAM) 50 MG tablet Take by mouth.    . valsartan-hydrochlorothiazide (DIOVAN-HCT) 320-25 MG tablet     . warfarin (COUMADIN) 1 MG tablet Take 1 mg by mouth See admin instructions. Take 2 tablets (2 mg) on Monday and Thursday only along with 5 mg tablet to equal 7 mg total.    . warfarin (COUMADIN) 2 MG tablet Take by mouth.    . warfarin (COUMADIN) 5 MG tablet Take by mouth.     No current facility-administered medications for this visit.     Musculoskeletal: Strength & Muscle Tone: UTA Gait & Station: UTA Patient leans: N/A  Psychiatric Specialty Exam: Review of Systems  Psychiatric/Behavioral: Negative for agitation, behavioral problems, confusion, decreased concentration, dysphoric mood, hallucinations, self-injury, sleep disturbance and suicidal ideas. The patient is not  nervous/anxious and is not hyperactive.   All other systems reviewed and are negative.   There were no vitals taken for this visit.There is no height or weight on file to calculate BMI.  General Appearance: UTA  Eye Contact:  UTA  Speech:  Clear and Coherent  Volume:  Normal  Mood:  Euthymic  Affect:  UTA  Thought Process:  Goal Directed and Descriptions of Associations: Intact  Orientation:  Full (Time, Place, and Person)  Thought Content: Logical   Suicidal Thoughts:  No  Homicidal Thoughts:  No  Memory:  Immediate;   Fair Recent;   Fair Remote;   Fair  Judgement:  Fair  Insight:  Fair  Psychomotor Activity:  UTA  Concentration:  Concentration: Fair and Attention Span: Fair  Recall:  [employer] of Knowledge: Fair  Language: Fair  Akathisia:  No  Handed:  Right  AIMS (if indicated): UTA  Assets:  Communication Skills Desire for Improvement Housing Intimacy Social Support  ADL's:  Intact  Cognition: WNL  Sleep:  Fair   Screenings: AIMS     [facility]/10/[facility]  AIMS Total Score  0       Assessment and Plan: [name] is a 64 year old African-American male who has a 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 Patient preferred to do a phone call today. Patient however was offered a video call option which he declined. Patient has been noncompliant with follow-up sessions however today reports his mood symptoms are stable. Plan as noted below.  Plan Bipolar disorder in full remission Trileptal 300 mg p.o. 3 times daily Seroquel 100 mg p.o. nightly and Seroquel 50 mg p.o. twice daily as needed for severe anxiety.  Insomnia-stable Seroquel 100 mg p.o. nightly  GAD-stable Continue Seroquel as prescribed Patient has been noncompliant with psychotherapy sessions. We will encourage him to continue psychotherapy sessions as needed  Noncompliance with recommendations-encouraged  compliance  High risk medication use-will order the following labs-lipid panel, hemoglobin A1c, prolactin, CBC with differential, BMP . He will get it from [facility].  I have reviewed EKG in [facility] dated 08/01/2019-within  normal limits.  Follow-up in clinic in 8 weeks or sooner if needed.  I have spent atleast 20 minutes non who face to face with patient today. More than 50 % of the time was spent for preparing to see the patient ( e.g., review of test, records ), obtaining and to review and separately obtained history , ordering medications and test ,psychoeducation and supportive psychotherapy and care coordination,as well as documenting clinical information in electronic health record. This note was generated in part or whole with voice recognition software. Voice recognition is usually quite accurate but there are transcription errors that can and very often do occur. I apologize for any typographical errors that were not detected and corrected.       Ursula Alert, MD 10/25/2019, 12:05 PM

## 2019-12-27 ENCOUNTER — Telehealth: Payer: Medicare Other | Admitting: Psychiatry

## 2019-12-27 ENCOUNTER — Other Ambulatory Visit: Payer: Self-pay

## 2019-12-29 ENCOUNTER — Telehealth (INDEPENDENT_AMBULATORY_CARE_PROVIDER_SITE_OTHER): Payer: Medicare Other | Admitting: Psychiatry

## 2019-12-29 ENCOUNTER — Encounter: Payer: Self-pay | Admitting: Psychiatry

## 2019-12-29 ENCOUNTER — Other Ambulatory Visit: Payer: Self-pay

## 2019-12-29 DIAGNOSIS — G4701 Insomnia due to medical condition: Secondary | ICD-10-CM

## 2019-12-29 DIAGNOSIS — F3178 Bipolar disorder, in full remission, most recent episode mixed: Secondary | ICD-10-CM

## 2019-12-29 DIAGNOSIS — Z79899 Other long term (current) drug therapy: Secondary | ICD-10-CM | POA: Diagnosis not present

## 2019-12-29 DIAGNOSIS — F411 Generalized anxiety disorder: Secondary | ICD-10-CM | POA: Diagnosis not present

## 2019-12-29 MED ORDER — OXCARBAZEPINE 300 MG PO TABS: 300.0000 mg | ORAL_TABLET | Freq: Three times a day (TID) | ORAL | 1 refills | Status: DC

## 2019-12-29 MED ORDER — QUETIAPINE FUMARATE 50 MG PO TABS: 50.0000 mg | ORAL_TABLET | Freq: Two times a day (BID) | ORAL | 1 refills | Status: DC | PRN

## 2019-12-29 NOTE — Progress Notes (Signed)
Provider Location : [facility] Patient Location : Home  Virtual Visit via Video Note  I connected with [name] on 12/29/19 at  3:30 PM EDT by a video enabled telemedicine application and verified that I am speaking with the correct person using two identifiers.   I discussed the limitations of evaluation and management by telemedicine and the availability of in person appointments. The patient expressed understanding and agreed to proceed.   I discussed the assessment and treatment plan with the patient. The patient was provided an opportunity to ask questions and all were answered. The patient agreed with the plan and demonstrated an understanding of the instructions.   The patient was advised to call back or seek an in-person evaluation if the symptoms worsen or if the condition fails to improve as anticipated.   BH MD OP Progress Note  12/29/2019 4:11 PM [name]  MRN:  [id number]  Chief Complaint:  Chief Complaint    Follow-up     HPI: [name] is a 64 year old African-American male, lives in [place], has a history of bipolar disorder, GAD, insomnia, history of DVT, hyperlipidemia, diabetes melitis was evaluated by telemedicine today.   Patient being a limited historian, collateral information was obtained from wife-[name].  Per wife patient recently had neuropsychological testing done and has further testing scheduled.  Per wife they were told that he never had head injury and that he may never have had bipolar disorder.  Per neuropsychological testing wife reports they were told that he may have frontotemporal dementia.  She however reports she still has to go back for further investigation and he has a PET scan scheduled.  Patient continues to be stable on current medications.  He is sleeping good on the Seroquel.  He is always worried that someone might do something to him, likely paranoid especially when left alone.  However wife stays with  him now since she is currently on FMLA and that definitely helps to encourage him.  These are all symptoms he has been struggling with since the past several years and he was initiated on Seroquel for the same.  He also has a history of aggression, irritability, mood swings and he continues to take Trileptal which currently keeps his mood swings stable.  Patient does feel depressed on and off due to the current pandemic and being unable to do things like he used to before.  Patient denies any suicidality, homicidality or perceptual disturbances.  Patient denies any other concerns today.    Visit Diagnosis:    ICD-10-CM   1. Bipolar disorder, in full remission, most recent episode mixed (HCC)  F31.78 QUEtiapine (SEROQUEL) 50 MG tablet    Oxcarbazepine (TRILEPTAL) 300 MG tablet  2. GAD (generalized anxiety disorder)  F41.1 QUEtiapine (SEROQUEL) 50 MG tablet  3. Insomnia due to medical condition  G47.01 QUEtiapine (SEROQUEL) 50 MG tablet  4. High risk medication use  Z79.899     Past Psychiatric History: I have reviewed past psychiatric history from my progress note on 09/09/2017.  Past Medical History:  Past Medical History:  Diagnosis Date  . Bipolar disorder (HCC)   . Diabetes mellitus without complication (HCC)   . History of blood clots   . Hypertension     Past Surgical History:  Procedure Laterality Date  . BACK SURGERY    . CARPAL TUNNEL RELEASE Bilateral   . TONSILLECTOMY      Family Psychiatric History: I have reviewed family psychiatric history from my progress note on 09/09/2017.  Family History:  Family History  Problem Relation Age of Onset  . Depression Father     Social History: I have reviewed social history from my progress note on 09/09/2017. Social History   Socioeconomic History  . Marital status: Married    Spouse name: [name]  . Number of children: 0  . Years of education: Not on file  . Highest education level: Associate degree: occupational,  Scientist, product/process development, or vocational program  Occupational History  . Not on file  Tobacco Use  . Smoking status: Never Smoker  . Smokeless tobacco: Never Used  Vaping Use  . Vaping Use: Never used  Substance and Sexual Activity  . Alcohol use: Yes    Comment: social  . Drug use: No  . Sexual activity: Not on file  Other Topics Concern  . Not on file  Social History Narrative  . Not on file   Social Determinants of Health   Financial Resource Strain:   . Difficulty of Paying Living Expenses:   Food Insecurity:   . Worried About Programme researcher, broadcasting/film/video in the Last Year:   . Barista in the Last Year:   Transportation Needs:   . Freight forwarder (Medical):   Marland Kitchen Lack of Transportation (Non-Medical):   Physical Activity:   . Days of Exercise per Week:   . Minutes of Exercise per Session:   Stress:   . Feeling of Stress :   Social Connections:   . Frequency of Communication with Friends and Family:   . Frequency of Social Gatherings with Friends and Family:   . Attends Religious Services:   . Active Member of Clubs or Organizations:   . Attends Banker Meetings:   Marland Kitchen Marital Status:     Allergies: No Known Allergies  Metabolic Disorder Labs: Lab Results  Component Value Date   HGBA1C 7.0 (H) 01/03/2016   No results found for: PROLACTIN No results found for: CHOL, TRIG, HDL, CHOLHDL, VLDL, LDLCALC No results found for: TSH  Therapeutic Level Labs: Lab Results  Component Value Date   LITHIUM 1.24 (H) 01/04/2016   LITHIUM 1.51 (HH) 01/03/2016   No results found for: VALPROATE No components found for:  CBMZ  Current Medications: Current Outpatient Medications  Medication Sig Dispense Refill  . amLODipine (NORVASC) 10 MG tablet     . amLODipine (NORVASC) 5 MG tablet Take 1 tablet (5 mg total) by mouth daily. 30 tablet 2  . atorvastatin (LIPITOR) 80 MG tablet Take 80 mg by mouth at bedtime.    . ciprofloxacin (CIPRO) 500 MG tablet Take 500 mg by mouth  2 (two) times daily. for 7 days  0  . ELIQUIS 5 MG TABS tablet TAKE TWO TABLETS BY MOUTH TWICE DAILY FOR 7 DAYS. THEN DECREASE TO TAKE ONE TABLET BY MOUTH TWICE DAILY THEREAFTER    . enoxaparin (LOVENOX) 120 MG/0.8ML injection INJECT ONE SYRINGEFUL SUBCUTANEOUSLY TWICE DAILY  0  . glipiZIDE (GLUCOTROL XL) 10 MG 24 hr tablet Take 10 mg by mouth daily with breakfast.     . hydrochlorothiazide (HYDRODIURIL) 25 MG tablet Take 25 mg by mouth daily.    . hydrochlorothiazide (MICROZIDE) 12.5 MG capsule Take by mouth.    . losartan (COZAAR) 100 MG tablet Take by mouth.    . metFORMIN (GLUCOPHAGE) 500 MG tablet Take 500 mg by mouth daily.    . metFORMIN (GLUCOPHAGE-XR) 500 MG 24 hr tablet Take 1,000 mg by mouth at bedtime.     Marland Kitchen  olmesartan (BENICAR) 40 MG tablet Take 40 mg by mouth daily.    Marland Kitchen. olmesartan-hydrochlorothiazide (BENICAR HCT) 40-25 MG tablet     . Oxcarbazepine (TRILEPTAL) 300 MG tablet Take 1 tablet (300 mg total) by mouth 3 (three) times daily. 90 tablet 1  . QUEtiapine (SEROQUEL) 100 MG tablet Take 1 tablet (100 mg total) by mouth at bedtime. 30 tablet 1  . QUEtiapine (SEROQUEL) 50 MG tablet Take 1 tablet (50 mg total) by mouth 2 (two) times daily as needed. For severe anxiety and agitation 60 tablet 1  . rosuvastatin (CRESTOR) 20 MG tablet     . spironolactone (ALDACTONE) 100 MG tablet     . traMADol (ULTRAM) 50 MG tablet Take by mouth.    . valsartan-hydrochlorothiazide (DIOVAN-HCT) 320-25 MG tablet     . warfarin (COUMADIN) 1 MG tablet Take 1 mg by mouth See admin instructions. Take 2 tablets (2 mg) on Monday and Thursday only along with 5 mg tablet to equal 7 mg total.    . warfarin (COUMADIN) 2 MG tablet Take by mouth.    . warfarin (COUMADIN) 5 MG tablet Take by mouth.     No current facility-administered medications for this visit.     Musculoskeletal: Strength & Muscle Tone: UTA Gait & Station: Uses a cane Patient leans: N/A  Psychiatric Specialty Exam: Review of Systems   Psychiatric/Behavioral: Positive for dysphoric mood. Negative for agitation, behavioral problems, confusion, decreased concentration, hallucinations, self-injury, sleep disturbance and suicidal ideas. The patient is not nervous/anxious and is not hyperactive.   All other systems reviewed and are negative.   There were no vitals taken for this visit.There is no height or weight on file to calculate BMI.  General Appearance: Fairly Groomed  Eye Contact:  Fair  Speech:  Clear and Coherent  Volume:  Normal  Mood:  Dysphoric coping ok  Affect:  Congruent  Thought Process:  Goal Directed and Descriptions of Associations: Intact  Orientation:  Full (Time, Place, and Person)  Thought Content: Logical   Suicidal Thoughts:  No  Homicidal Thoughts:  No  Memory:  Immediate;   Fair Recent;   Fair Remote;   Fair  Judgement:  Fair  Insight:  Fair  Psychomotor Activity:  Normal  Concentration:  Concentration: Fair and Attention Span: Fair  Recall:  [employer]  Fund of Knowledge: Fair  Language: Fair  Akathisia:  No  Handed:  Right  AIMS (if indicated): UTA  Assets:  Communication Skills Desire for Improvement Social Support  ADL's:  Intact  Cognition: WNL  Sleep:  Fair   Screenings: AIMS     [facility]/10/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  Patient is currently stable on current medication regimen.  Discussed plan as noted below.  Plan Bipolar disorder in remission Trileptal 300 mg p.o. 3 times daily Seroquel 100 mg p.o. nightly and Seroquel 50 mg p.o. twice daily as needed for severe anxiety symptoms  Insomnia-stable Seroquel 100 mg p.o. nightly  GAD-stable Seroquel will help. Patient was encouraged to restart psychotherapy sessions.  He has been  noncompliant.  Collateral information was obtained from wife [name] summarized above.  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  She will sign a release to send all of the medical records to  Clinical research associate.  She however does not want him to get off of any of the medications now.  Will make more recommendations after reviewing his recent neuropsychological testing.  However discussed with patient as well as wife that he can restart psychotherapy sessions given the new diagnosis as well as the current pandemic.  They are agreeable.  The following labs were ordered-lipid panel hemoglobin A1c, prolactin, CBC with differential, BMP on 10/25/2019-pending.  Patient agrees to sign a release to obtain labs from his primary care provider.  He reports he had all these labs done and will fax it to [place].   Follow-up in clinic in 2 months or sooner if needed.  I have spent atleast 20 minutes non face to face with patient today. More than 50 % of the time was spent for preparing to see the patient ( e.g., review of test, records ), obtaining and to review and separately obtained history , ordering medications and test ,psychoeducation and supportive psychotherapy and care coordination,as well as documenting clinical information in electronic health record. This note was generated in part or whole with voice recognition software. Voice recognition is usually quite accurate but there are transcription errors that can and very often do occur. I apologize for any typographical errors that were not detected and corrected.           [name], MD 12/30/2019, 9:00 AM

## 2020-01-16 ENCOUNTER — Encounter: Payer: Self-pay | Admitting: Emergency Medicine

## 2020-01-16 ENCOUNTER — Inpatient Hospital Stay
Admission: EM | Admit: 2020-01-16 | Discharge: 2020-01-19 | DRG: 602 | Disposition: A | Discharge status: Skilled Nursing Facility | Payer: Medicare Other | Attending: Internal Medicine | Admitting: Internal Medicine

## 2020-01-16 ENCOUNTER — Other Ambulatory Visit: Payer: Self-pay

## 2020-01-16 DIAGNOSIS — F319 Bipolar disorder, unspecified: Secondary | ICD-10-CM | POA: Diagnosis present

## 2020-01-16 DIAGNOSIS — Z7984 Long term (current) use of oral hypoglycemic drugs: Secondary | ICD-10-CM

## 2020-01-16 DIAGNOSIS — R7989 Other specified abnormal findings of blood chemistry: Secondary | ICD-10-CM | POA: Diagnosis present

## 2020-01-16 DIAGNOSIS — N390 Urinary tract infection, site not specified: Secondary | ICD-10-CM | POA: Diagnosis present

## 2020-01-16 DIAGNOSIS — L03319 Cellulitis of trunk, unspecified: Secondary | ICD-10-CM | POA: Diagnosis present

## 2020-01-16 DIAGNOSIS — E1122 Type 2 diabetes mellitus with diabetic chronic kidney disease: Secondary | ICD-10-CM | POA: Diagnosis present

## 2020-01-16 DIAGNOSIS — N3000 Acute cystitis without hematuria: Secondary | ICD-10-CM | POA: Diagnosis present

## 2020-01-16 DIAGNOSIS — L89154 Pressure ulcer of sacral region, stage 4: Secondary | ICD-10-CM | POA: Diagnosis present

## 2020-01-16 DIAGNOSIS — I959 Hypotension, unspecified: Secondary | ICD-10-CM | POA: Diagnosis present

## 2020-01-16 DIAGNOSIS — Z7901 Long term (current) use of anticoagulants: Secondary | ICD-10-CM

## 2020-01-16 DIAGNOSIS — Z87820 Personal history of traumatic brain injury: Secondary | ICD-10-CM | POA: Diagnosis not present

## 2020-01-16 DIAGNOSIS — Z818 Family history of other mental and behavioral disorders: Secondary | ICD-10-CM

## 2020-01-16 DIAGNOSIS — Z6838 Body mass index (BMI) 38.0-38.9, adult: Secondary | ICD-10-CM | POA: Diagnosis not present

## 2020-01-16 DIAGNOSIS — E1129 Type 2 diabetes mellitus with other diabetic kidney complication: Secondary | ICD-10-CM | POA: Diagnosis not present

## 2020-01-16 DIAGNOSIS — R778 Other specified abnormalities of plasma proteins: Secondary | ICD-10-CM | POA: Diagnosis not present

## 2020-01-16 DIAGNOSIS — I82409 Acute embolism and thrombosis of unspecified deep veins of unspecified lower extremity: Secondary | ICD-10-CM | POA: Diagnosis present

## 2020-01-16 DIAGNOSIS — I129 Hypertensive chronic kidney disease with stage 1 through stage 4 chronic kidney disease, or unspecified chronic kidney disease: Secondary | ICD-10-CM | POA: Diagnosis present

## 2020-01-16 DIAGNOSIS — F411 Generalized anxiety disorder: Secondary | ICD-10-CM | POA: Diagnosis present

## 2020-01-16 DIAGNOSIS — N35919 Unspecified urethral stricture, male, unspecified site: Secondary | ICD-10-CM | POA: Diagnosis present

## 2020-01-16 DIAGNOSIS — L8990 Pressure ulcer of unspecified site, unspecified stage: Secondary | ICD-10-CM | POA: Diagnosis present

## 2020-01-16 DIAGNOSIS — G9341 Metabolic encephalopathy: Secondary | ICD-10-CM | POA: Diagnosis present

## 2020-01-16 DIAGNOSIS — I825Z9 Chronic embolism and thrombosis of unspecified deep veins of unspecified distal lower extremity: Secondary | ICD-10-CM | POA: Diagnosis not present

## 2020-01-16 DIAGNOSIS — I248 Other forms of acute ischemic heart disease: Secondary | ICD-10-CM | POA: Diagnosis present

## 2020-01-16 DIAGNOSIS — I10 Essential (primary) hypertension: Secondary | ICD-10-CM | POA: Diagnosis not present

## 2020-01-16 DIAGNOSIS — L03312 Cellulitis of back [any part except buttock]: Principal | ICD-10-CM | POA: Diagnosis present

## 2020-01-16 DIAGNOSIS — N39490 Overflow incontinence: Secondary | ICD-10-CM | POA: Diagnosis present

## 2020-01-16 DIAGNOSIS — R531 Weakness: Secondary | ICD-10-CM | POA: Diagnosis present

## 2020-01-16 DIAGNOSIS — A419 Sepsis, unspecified organism: Secondary | ICD-10-CM | POA: Diagnosis not present

## 2020-01-16 DIAGNOSIS — Z20822 Contact with and (suspected) exposure to covid-19: Secondary | ICD-10-CM | POA: Diagnosis present

## 2020-01-16 DIAGNOSIS — N179 Acute kidney failure, unspecified: Secondary | ICD-10-CM | POA: Diagnosis present

## 2020-01-16 DIAGNOSIS — Z86718 Personal history of other venous thrombosis and embolism: Secondary | ICD-10-CM

## 2020-01-16 DIAGNOSIS — Z79899 Other long term (current) drug therapy: Secondary | ICD-10-CM

## 2020-01-16 DIAGNOSIS — G4730 Sleep apnea, unspecified: Secondary | ICD-10-CM | POA: Diagnosis present

## 2020-01-16 DIAGNOSIS — Z933 Colostomy status: Secondary | ICD-10-CM

## 2020-01-16 DIAGNOSIS — N1831 Chronic kidney disease, stage 3a: Secondary | ICD-10-CM | POA: Diagnosis present

## 2020-01-16 DIAGNOSIS — E669 Obesity, unspecified: Secondary | ICD-10-CM | POA: Diagnosis present

## 2020-01-16 DIAGNOSIS — L89159 Pressure ulcer of sacral region, unspecified stage: Secondary | ICD-10-CM | POA: Diagnosis not present

## 2020-01-16 DIAGNOSIS — E785 Hyperlipidemia, unspecified: Secondary | ICD-10-CM | POA: Diagnosis present

## 2020-01-16 DIAGNOSIS — F3170 Bipolar disorder, currently in remission, most recent episode unspecified: Secondary | ICD-10-CM | POA: Diagnosis not present

## 2020-01-16 DIAGNOSIS — L089 Local infection of the skin and subcutaneous tissue, unspecified: Secondary | ICD-10-CM

## 2020-01-16 DIAGNOSIS — R6520 Severe sepsis without septic shock: Secondary | ICD-10-CM | POA: Diagnosis not present

## 2020-01-16 DIAGNOSIS — N189 Chronic kidney disease, unspecified: Secondary | ICD-10-CM | POA: Diagnosis present

## 2020-01-16 LAB — CBC
HCT: 34.6 % — ABNORMAL LOW (ref 39.0–52.0)
Hemoglobin: 11.1 g/dL — ABNORMAL LOW (ref 13.0–17.0)
MCH: 27.1 pg (ref 26.0–34.0)
MCHC: 32.1 g/dL (ref 30.0–36.0)
MCV: 84.4 fL (ref 80.0–100.0)
Platelets: 181 10*3/uL (ref 150–400)
RBC: 4.1 MIL/uL — ABNORMAL LOW (ref 4.22–5.81)
RDW: 17.1 % — ABNORMAL HIGH (ref 11.5–15.5)
WBC: 12.2 10*3/uL — ABNORMAL HIGH (ref 4.0–10.5)
nRBC: 0 % (ref 0.0–0.2)

## 2020-01-16 LAB — URINALYSIS, COMPLETE (UACMP) WITH MICROSCOPIC
Bilirubin Urine: NEGATIVE
Glucose, UA: NEGATIVE mg/dL
Ketones, ur: NEGATIVE mg/dL
Nitrite: NEGATIVE
Protein, ur: NEGATIVE mg/dL
Specific Gravity, Urine: 1.01 (ref 1.005–1.030)
WBC, UA: >50 WBC/hpf — ABNORMAL HIGH (ref 0–5)
pH: 5 (ref 5.0–8.0)

## 2020-01-16 LAB — HIV ANTIBODY (ROUTINE TESTING W REFLEX): HIV Screen 4th Generation wRfx: NONREACTIVE

## 2020-01-16 LAB — HEPATIC FUNCTION PANEL
ALT: 17 U/L (ref 0–44)
AST: 21 U/L (ref 15–41)
Albumin: 3.1 g/dL — ABNORMAL LOW (ref 3.5–5.0)
Alkaline Phosphatase: 84 U/L (ref 38–126)
Bilirubin, Direct: 0.3 mg/dL — ABNORMAL HIGH (ref 0.0–0.2)
Indirect Bilirubin: 0.7 mg/dL (ref 0.3–0.9)
Total Bilirubin: 1 mg/dL (ref 0.3–1.2)
Total Protein: 7.6 g/dL (ref 6.5–8.1)

## 2020-01-16 LAB — BASIC METABOLIC PANEL
Anion gap: 7 (ref 5–15)
BUN: 30 mg/dL — ABNORMAL HIGH (ref 8–23)
CO2: 23 mmol/L (ref 22–32)
Calcium: 8.5 mg/dL — ABNORMAL LOW (ref 8.9–10.3)
Chloride: 100 mmol/L (ref 98–111)
Creatinine, Ser: 1.91 mg/dL — ABNORMAL HIGH (ref 0.61–1.24)
GFR calc Af Amer: 42 mL/min — ABNORMAL LOW (ref >60)
GFR calc non Af Amer: 36 mL/min — ABNORMAL LOW (ref >60)
Glucose, Bld: 167 mg/dL — ABNORMAL HIGH (ref 70–99)
Potassium: 3.8 mmol/L (ref 3.5–5.1)
Sodium: 130 mmol/L — ABNORMAL LOW (ref 135–145)

## 2020-01-16 LAB — GLUCOSE, CAPILLARY: Glucose-Capillary: 184 mg/dL — ABNORMAL HIGH (ref 70–99)

## 2020-01-16 LAB — TROPONIN I (HIGH SENSITIVITY)
Troponin I (High Sensitivity): 24 ng/L — ABNORMAL HIGH (ref <18)
Troponin I (High Sensitivity): 18 ng/L — ABNORMAL HIGH (ref <18)

## 2020-01-16 LAB — C-REACTIVE PROTEIN: CRP: 14.2 mg/dL — ABNORMAL HIGH (ref <1.0)

## 2020-01-16 LAB — LACTIC ACID, PLASMA
Lactic Acid, Venous: 1.2 mmol/L (ref 0.5–1.9)
Lactic Acid, Venous: 1 mmol/L (ref 0.5–1.9)

## 2020-01-16 LAB — SARS CORONAVIRUS 2 BY RT PCR (HOSPITAL ORDER, PERFORMED IN ~~LOC~~ HOSPITAL LAB): SARS Coronavirus 2: NEGATIVE

## 2020-01-16 LAB — SEDIMENTATION RATE: Sed Rate: 65 mm/hr — ABNORMAL HIGH (ref 0–20)

## 2020-01-16 LAB — PROCALCITONIN: Procalcitonin: 1 ng/mL

## 2020-01-16 MED ORDER — SODIUM CHLORIDE 0.9 % IV BOLUS
1000.0000 mL | Freq: Once | INTRAVENOUS | Status: AC
  Administered 2020-01-16: 1000 mL via INTRAVENOUS

## 2020-01-16 MED ORDER — VANCOMYCIN HCL IN DEXTROSE 1-5 GM/200ML-% IV SOLN
1000.0000 mg | Freq: Once | INTRAVENOUS | Status: AC
  Administered 2020-01-16: 1000 mg via INTRAVENOUS
  Filled 2020-01-16: qty 200

## 2020-01-16 MED ORDER — APIXABAN 5 MG PO TABS
5.0000 mg | ORAL_TABLET | Freq: Two times a day (BID) | ORAL | Status: DC
  Administered 2020-01-16 – 2020-01-19 (×6): 5 mg via ORAL
  Filled 2020-01-16 (×6): qty 1

## 2020-01-16 MED ORDER — SODIUM CHLORIDE 0.9 % IV SOLN
1.0000 g | Freq: Once | INTRAVENOUS | Status: AC
  Administered 2020-01-16: 1 g via INTRAVENOUS
  Filled 2020-01-16: qty 10

## 2020-01-16 MED ORDER — VANCOMYCIN HCL IN DEXTROSE 1-5 GM/200ML-% IV SOLN
1000.0000 mg | INTRAVENOUS | Status: DC
  Filled 2020-01-16: qty 200

## 2020-01-16 MED ORDER — QUETIAPINE FUMARATE 100 MG PO TABS
100.0000 mg | ORAL_TABLET | Freq: Every day | ORAL | Status: DC
  Administered 2020-01-16 – 2020-01-18 (×3): 100 mg via ORAL
  Filled 2020-01-16 (×3): qty 1
  Filled 2020-01-16: qty 4
  Filled 2020-01-16: qty 1

## 2020-01-16 MED ORDER — INSULIN ASPART 100 UNIT/ML ~~LOC~~ SOLN: 0.0000 [IU] | Freq: Every day | SUBCUTANEOUS | Status: DC

## 2020-01-16 MED ORDER — SODIUM CHLORIDE 0.9 % IV SOLN
1.0000 g | INTRAVENOUS | Status: DC
  Filled 2020-01-16: qty 10

## 2020-01-16 MED ORDER — SODIUM CHLORIDE 0.9% FLUSH: 3.0000 mL | Freq: Once | INTRAVENOUS | Status: DC

## 2020-01-16 MED ORDER — INSULIN ASPART 100 UNIT/ML ~~LOC~~ SOLN
0.0000 [IU] | Freq: Three times a day (TID) | SUBCUTANEOUS | Status: DC
  Administered 2020-01-17 – 2020-01-19 (×6): 1 [IU] via SUBCUTANEOUS
  Filled 2020-01-16 (×4): qty 1

## 2020-01-16 MED ORDER — SODIUM CHLORIDE 0.9 % IV SOLN: 1.0000 g | Freq: Once | INTRAVENOUS | Status: DC

## 2020-01-16 MED ORDER — SODIUM CHLORIDE 0.9 % IV BOLUS: 500.0000 mL | Freq: Once | INTRAVENOUS | Status: DC

## 2020-01-16 MED ORDER — ROSUVASTATIN CALCIUM 10 MG PO TABS
20.0000 mg | ORAL_TABLET | Freq: Every day | ORAL | Status: DC
  Administered 2020-01-17 – 2020-01-19 (×3): 20 mg via ORAL
  Filled 2020-01-16 (×3): qty 2

## 2020-01-16 MED ORDER — OXCARBAZEPINE 300 MG PO TABS
300.0000 mg | ORAL_TABLET | Freq: Three times a day (TID) | ORAL | Status: DC
  Administered 2020-01-16 – 2020-01-19 (×8): 300 mg via ORAL
  Filled 2020-01-16 (×10): qty 1

## 2020-01-16 MED ORDER — SODIUM CHLORIDE 0.9 % IV SOLN: INTRAVENOUS | Status: DC

## 2020-01-16 MED ORDER — ONDANSETRON HCL 4 MG/2ML IJ SOLN: 4.0000 mg | Freq: Three times a day (TID) | INTRAMUSCULAR | Status: DC | PRN

## 2020-01-16 MED ORDER — ACETAMINOPHEN 325 MG PO TABS: 650.0000 mg | ORAL_TABLET | Freq: Four times a day (QID) | ORAL | Status: DC | PRN

## 2020-01-16 MED ORDER — QUETIAPINE FUMARATE 25 MG PO TABS: 50.0000 mg | ORAL_TABLET | Freq: Two times a day (BID) | ORAL | Status: DC | PRN

## 2020-01-16 NOTE — ED Notes (Signed)
I&O cath for urine sample

## 2020-01-16 NOTE — ED Notes (Signed)
Pt comes from home were he lives with his wife  Pt reports 2 days ago he started with leg weakness that has gotten increasingly worse Pt required 2 person assist to get into bed

## 2020-01-16 NOTE — ED Triage Notes (Signed)
Pt to ED via POV c/o generalized weakness and rash on bilateral lower back. Pt states that the rash has been there for a few days. Pt states that it is not itching or painful. Pt states that he felt like he had fever but has not checked his temp.
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
## 2020-01-16 NOTE — ED Notes (Signed)
Lab to room to collect blood

## 2020-01-16 NOTE — Progress Notes (Signed)
CH attempted visit per OR for prayer but pt. asleep.  CH will make referral/floow up tomorrow.

## 2020-01-16 NOTE — H&P (Signed)
History and Physical    [name] MBW:[id number] DOB: Feb 16, 1956 DOA: 01/16/2020  Referring MD/NP/PA:   PCP: [name], MD   Patient coming from:  The patient is coming from home.  At baseline, pt is partially dependent for most of ADL.        Chief Complaint: Generalized weakness, increased urinary frequency, erythema around the sacral wound  HPI: [name] is a 64 y.o. 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  Per his wife, patient has been feeling weak in the past several days.  He moves all extremities.  No facial droop or slurred speech.  He has increased urinary frequency, does not seem to have dysuria or burning on urination.  Patient does not have chest pain or shortness breath.  He has mild cough.  Patient has subjective fever per his wife, but his temperature is 98.3 in ED.  No nausea, vomiting, diarrhea or abdominal pain.  Patient has chronic sacral wound which has been managed in [facility].  He is s/p of colostomy for voiding fecal contamination.  Patient was noted to have erythematous change around sacral wound in buttock.  Per his wife, patient has been intermittently more confused than his baseline, but currently patient's mental status has improved.  He is alert and oriented x3 when I saw patient in ED.  Patient was initially hypotensive with blood pressure 80/48, which improved to 104/54 after giving 2 L normal saline bolus in ED.  ED Course: pt was found to have WBC 12.2, lactic acid 1.2, troponin 24, 18, positive urinalysis (cloudy appearance, large amount of leukocyte, rare bacteria, WBC > 50), worsening renal function, temperature normal, heart rate 58, 92, 62, RR 17, oxygen saturation 97% on room air.  Patient is admitted  to progressive bed as inpatient.  Review of Systems:   General: has subjective fevers, chills, no body weight gain, has fatigue HEENT: no blurry vision, hearing changes or sore throat Respiratory: no dyspnea, has coughing, no wheezing CV: no chest pain, no palpitations GI: no nausea, vomiting, abdominal pain, diarrhea, constipation.  S/p of colostomy GU: no dysuria, burning on urination, has increased urinary frequency, no hematuria  Ext: no leg edema Neuro: no unilateral weakness, numbness, or tingling, no vision change or hearing loss.  Has confusion Skin: Has sacral wound.  Has erythema around sacral wound MSK: No muscle spasm, no deformity, no limitation of range of movement in spin Heme: No easy bruising.  Travel history: No recent long distant travel.  Allergy: No Known Allergies  Past Medical History:  Diagnosis Date  . Bipolar disorder ([place])   . Diabetes mellitus without complication ([place])   . History of blood clots   . Hypertension     Past Surgical History:  Procedure Laterality Date  . BACK SURGERY    . CARPAL TUNNEL RELEASE Bilateral   . COLON SURGERY    . TONSILLECTOMY      Social History:  reports that he has never smoked. He has never used smokeless tobacco. He reports current alcohol use. He reports that he does not use drugs.  Family History:  Family History  Problem Relation Age of Onset  . Depression Father      Prior to Admission medications   Medication Sig Start Date End Date Taking? Authorizing Provider  amLODipine (NORVASC) 10 MG tablet  12/29/18  [provider]  amLODipine (NORVASC) 5 MG tablet Take 1 tablet (5 mg total) by mouth daily. 01/04/16   Gladstone Lighter, MD  atorvastatin (LIPITOR) 80 MG tablet Take 80 mg by mouth at bedtime. 12/02/15   [provider]  ciprofloxacin (CIPRO) 500 MG tablet Take 500 mg by mouth 2 (two) times daily. for 7 days 06/01/18   [provider]  ELIQUIS 5 MG TABS tablet TAKE TWO TABLETS  BY MOUTH TWICE DAILY FOR 7 DAYS. THEN DECREASE TO TAKE ONE TABLET BY MOUTH TWICE DAILY THEREAFTER 11/17/18   [provider]  enoxaparin (LOVENOX) 120 MG/0.8ML injection INJECT ONE SYRINGEFUL SUBCUTANEOUSLY TWICE DAILY 06/01/18   [provider]  glipiZIDE (GLUCOTROL XL) 10 MG 24 hr tablet Take 10 mg by mouth daily with breakfast.  09/26/15   [provider]  hydrochlorothiazide (HYDRODIURIL) 25 MG tablet Take 25 mg by mouth daily. 11/05/18   [provider]  hydrochlorothiazide (MICROZIDE) 12.5 MG capsule Take by mouth.    [provider]  losartan (COZAAR) 100 MG tablet Take by mouth.    [provider]  metFORMIN (GLUCOPHAGE) 500 MG tablet Take 500 mg by mouth daily. 12/17/19   [provider]  metFORMIN (GLUCOPHAGE-XR) 500 MG 24 hr tablet Take 1,000 mg by mouth at bedtime.  12/02/15   [provider]  olmesartan (BENICAR) 40 MG tablet Take 40 mg by mouth daily. 11/05/18   [provider]  olmesartan-hydrochlorothiazide (BENICAR HCT) 40-25 MG tablet  12/29/18   [provider]  Oxcarbazepine (TRILEPTAL) 300 MG tablet Take 1 tablet (300 mg total) by mouth 3 (three) times daily. 12/29/19   Ursula Alert, MD  QUEtiapine (SEROQUEL) 100 MG tablet Take 1 tablet (100 mg total) by mouth at bedtime. 10/25/19   Ursula Alert, MD  QUEtiapine (SEROQUEL) 50 MG tablet Take 1 tablet (50 mg total) by mouth 2 (two) times daily as needed. For severe anxiety and agitation 12/29/19   Ursula Alert, MD  rosuvastatin (CRESTOR) 20 MG tablet  08/29/17   [provider]  spironolactone (ALDACTONE) 100 MG tablet  01/31/16   [provider]  traMADol (ULTRAM) 50 MG tablet Take by mouth.    [provider]  valsartan-hydrochlorothiazide (DIOVAN-HCT) 320-25 MG tablet  01/29/16   [provider]  warfarin (COUMADIN) 1 MG tablet Take 1 mg by mouth See admin instructions. Take 2 tablets (2 mg) on Monday and Thursday  only along with 5 mg tablet to equal 7 mg total.    [provider]  warfarin (COUMADIN) 2 MG tablet Take by mouth.    [provider]  warfarin (COUMADIN) 5 MG tablet Take by mouth.    [provider]    Physical Exam: Vitals:   01/16/20 1530 01/16/20 1730 01/16/20 1800 01/16/20 1838  BP: 97/61 (!) 100/55 111/70 130/77  Pulse: 65 (!) 59 61 60  Resp:    18  Temp:    98.2 F (36.8 C)  SpO2: 99% 100% 100% 100%  Weight:    107.6 kg  Height:    5' 6"  (1.676 m)   General: Not in acute distress HEENT:       Eyes: PERRL, EOMI, no scleral icterus.       ENT: No discharge from the ears and nose, no pharynx injection, no tonsillar enlargement.        Neck: No JVD, no bruit, no mass felt. Heme: No neck lymph node enlargement. Cardiac: S1/S2, RRR, No murmurs, No gallops  or rubs. Respiratory: No rales, wheezing, rhonchi or rubs. GI: Soft, nondistended, nontender, no rebound pain, no organomegaly, BS present. S/p of colostomy GU: No hematuria Ext: No pitting leg edema bilaterally. 2+DP/PT pulse bilaterally. Musculoskeletal: No joint deformities, No joint redness or warmth, no limitation of ROM in spin. Skin: Has sacral wound without active drainage.  Has erythema around the sacral wound in the buttock.        Neuro: Alert, oriented X3, cranial nerves II-XII grossly intact, moves all extremities normally.  Psych: Patient is not psychotic, no suicidal or hemocidal ideation.  Labs on Admission: I have personally reviewed following labs and imaging studies  CBC: Recent Labs  Lab 01/16/20 0942  WBC 12.2*  HGB 11.1*  HCT 34.6*  MCV 84.4  PLT 284   Basic Metabolic Panel: Recent Labs  Lab 01/16/20 0942  NA 130*  K 3.8  CL 100  CO2 23  GLUCOSE 167*  BUN 30*  CREATININE 1.91*  CALCIUM 8.5*   GFR: Estimated Creatinine Clearance: 44.9 mL/min (A) (by C-G formula based on SCr of 1.91 mg/dL (H)). Liver Function Tests: Recent Labs  Lab 01/16/20 0942   AST 21  ALT 17  ALKPHOS 84  BILITOT 1.0  PROT 7.6  ALBUMIN 3.1*   No results for input(s): LIPASE, AMYLASE in the last 168 hours. No results for input(s): AMMONIA in the last 168 hours. Coagulation Profile: No results for input(s): INR, PROTIME in the last 168 hours. Cardiac Enzymes: No results for input(s): CKTOTAL, CKMB, CKMBINDEX, TROPONINI in the last 168 hours. BNP (last 3 results) No results for input(s): PROBNP in the last 8760 hours. HbA1C: No results for input(s): HGBA1C in the last 72 hours. CBG: No results for input(s): GLUCAP in the last 168 hours. Lipid Profile: No results for input(s): CHOL, HDL, LDLCALC, TRIG, CHOLHDL, LDLDIRECT in the last 72 hours. Thyroid Function Tests: No results for input(s): TSH, T4TOTAL, FREET4, T3FREE, THYROIDAB in the last 72 hours. Anemia Panel: No results for input(s): VITAMINB12, FOLATE, FERRITIN, TIBC, IRON, RETICCTPCT in the last 72 hours. Urine analysis:    Component Value Date/Time   COLORURINE YELLOW (A) 01/16/2020 1300   APPEARANCEUR CLOUDY (A) 01/16/2020 1300   LABSPEC 1.010 01/16/2020 1300   PHURINE 5.0 01/16/2020 1300   GLUCOSEU NEGATIVE 01/16/2020 1300   HGBUR MODERATE (A) 01/16/2020 1300   BILIRUBINUR NEGATIVE 01/16/2020 1300   KETONESUR NEGATIVE 01/16/2020 1300   PROTEINUR NEGATIVE 01/16/2020 1300   NITRITE NEGATIVE 01/16/2020 1300   LEUKOCYTESUR LARGE (A) 01/16/2020 1300   Sepsis Labs: @LABRCNTIP (procalcitonin:4,lacticidven:4) ) Recent Results (from the past 240 hour(s))  SARS Coronavirus 2 by RT PCR (hospital order, performed in [place] hospital lab) Nasopharyngeal Nasopharyngeal Swab     Status: None   Collection Time: 01/16/20  4:30 PM   Specimen: Nasopharyngeal Swab  Result Value Ref Range Status   SARS Coronavirus 2 NEGATIVE NEGATIVE Final    Comment: (NOTE) SARS-CoV-2 target nucleic acids are NOT DETECTED.  The SARS-CoV-2 RNA is generally detectable in upper and lower respiratory specimens  during the acute phase of infection. The lowest concentration of SARS-CoV-2 viral copies this assay can detect is 250 copies / mL. A negative result does not preclude SARS-CoV-2 infection and should not be used as the sole basis for treatment or other patient management decisions.  A negative result may occur with improper specimen collection / handling, submission of specimen other than nasopharyngeal swab, presence of viral mutation(s) within the areas targeted by this assay, and inadequate  number of viral copies (<250 copies / mL). A negative result must be combined with clinical observations, patient history, and epidemiological information.  Fact Sheet for Patients:   StrictlyIdeas.no  Fact Sheet for Healthcare Providers: BankingDealers.co.za  This test is not yet approved or  cleared by the [place] FDA and has been authorized for detection and/or diagnosis of SARS-CoV-2 by FDA under an Emergency Use Authorization (EUA).  This EUA will remain in effect (meaning this test can be used) for the duration of the COVID-19 declaration under Section 564(b)(1) of the Act, 21 U.S.C. section 360bbb-3(b)(1), unless the authorization is terminated or revoked sooner.  Performed at [facility], [address] [place], [place] [address]      Radiological Exams on Admission: No results found.   EKG: Independently reviewed.  Sinus rhythm, QTC 435, LAE, nonspecific T wave change  Assessment/Plan Principal Problem:   UTI (urinary tract infection) Active Problems:   Bipolar disorder (HCC)   Hyperlipidemia   Hypertension   Type II diabetes mellitus with renal manifestations (HCC)   DVT (deep venous thrombosis) (HCC)   Sepsis (HCC)   Cellulitis of sacral region   Elevated troponin   HLD (hyperlipidemia)   Acute renal failure superimposed on stage 3a chronic kidney disease (HCC)   Acute metabolic encephalopathy  Sepsis  due to UTI and cellulitis of sacral region: Patient meets criteria for sepsis with leukocytosis, hypotension.  Lactic acid is normal.  Blood pressure responded to IV fluid.  Currently hemodynamically stabilized.  - will admit to progressive bed as inpatient - Empiric antimicrobial treatment with vancomycin and Rocephin to cover both UTI and sacral cellulitis - Blood cultures x 2  - ESR and CRP - wound care consult - will get Procalcitonin and trend lactic acid levels per sepsis protocol. - IVF: 3.5 L of NS bolus in ED, followed by 100  Bipolar disorder ([place]): -Continue home Trileptal and Seroquel  Hyperlipidemia -Crestor  Hypertension: Patient is not taking medications at home -Will not start blood pressure medications due to hypotension  Type II diabetes mellitus with renal manifestations ([place]): recent A1c 7.0.  Patient is taking Metformin and glipizide -Sliding scale insulin  DVT (deep venous thrombosis) (HCC) -Eliquis  Elevated troponin: Troponin 24 --> 18, already trending down.  No chest pain.  Likely nonspecific or mildly demand ischemia. -Continue Crestor  Acute renal failure superimposed on stage 3a chronic kidney disease ([place]): Baseline creatinine 1.1-1.3.  His creatinine is 1.91, BUN 30, likely due to UTI. -Avoid using renal toxic medications -IV fluid as above -Follow-up by BMP  Acute metabolic encephalopathy: Likely due to UTI.  No focal neuro deficit on physical examination.  Already, back to baseline. -Monitor closely         DVT ppx: on Eliquis Code Status: Full code Family Communication: Yes, patient's wife at bed side Disposition Plan:  Anticipate discharge back to previous environment Consults called:  none Admission status:  progressive unit as inpt   Status is: Inpatient  Remains inpatient appropriate because:Inpatient level of care appropriate due to severity of illness.  Patient has multiple comorbidities, now presents with sepsis due to UTI  and sacral cellulitis.  Patient also has elevated troponin.  Patient was initially hypotensive due to sepsis.  His presentation is highly complicated.  Patient is at high risk of deteriorating.  Will need to be treated in hospital for at least 2 days.   Dispo: The patient is from: Home  Anticipated d/c is to: Home              Anticipated d/c date is: 2 days              Patient currently is not medically stable to d/c.           Date of Service 01/16/2020    Ivor Costa [facility]   If 7PM-7AM, please contact night-coverage [email or web address] 01/16/2020, 6:58 PM

## 2020-01-16 NOTE — Consult Note (Signed)
Pharmacy Antibiotic Note  Waylon Hershey is a 64 y.o. male admitted on 01/16/2020 with sepsis, apparently from cystitis and cellulitis.  Pharmacy has been consulted for vancomycin dosing. In the ED he received 1 gram of ceftriaxone and 1000 mg IV vancomycin. His SCr is elevated above his apparent baseline level.  Plan: vancomycin  vancomycin 1000 mg IV x 1 additional dose to complete loading dose  begin vancomycin 1000 mg IV every 24 hours  T1/2: 20.6 h, Ke: 0.034 h-1  Css (calculated): 32.5/14.8 mcg/mL  SCr in am to assess renal function   Height: 5\' 6"  (167.6 cm) Weight: 111.1 kg (245 lb) IBW/kg (Calculated) : 63.8  Temp (24hrs), Avg:98.3 F (36.8 C), Min:98.3 F (36.8 C), Max:98.3 F (36.8 C)  Recent Labs  Lab 01/16/20 0942 01/16/20 1300  WBC 12.2*  --   CREATININE 1.91*  --   LATICACIDVEN  --  1.2    Estimated Creatinine Clearance: 45.7 mL/min (A) (by C-G formula based on SCr of 1.91 mg/dL (H)).    No Known Allergies  Antimicrobials this admission: ceftriaxone 7/18 >>  vanomycin 7/18 >>   Microbiology results: 7/18 BCx: pending 7/18 UCx: pending  7/18 SARS CoV-2: pending   Thank you for allowing pharmacy to be a part of this patient's care.  8/18 01/16/2020 5:19 PM

## 2020-01-16 NOTE — ED Notes (Signed)
Pt spouse intermittent cath patient despite being told to let the nursing staff complete cath with sterile supplies  Pt spouse is using a clean technique with the same tubing that she washes out in the sink

## 2020-01-16 NOTE — ED Notes (Signed)
Lab called to come and collect blood cultures

## 2020-01-16 NOTE — ED Notes (Signed)
Pt transported to floor per Gearldine Bienenstock RN ED charge, after speaking with Tammy charge RN 2A - floor nurse to call if any questions

## 2020-01-16 NOTE — ED Notes (Signed)
Pt had hypotensive episode in the 80's - provider notified and order received for nacl bolus

## 2020-01-17 DIAGNOSIS — L8990 Pressure ulcer of unspecified site, unspecified stage: Secondary | ICD-10-CM | POA: Diagnosis present

## 2020-01-17 LAB — BASIC METABOLIC PANEL
Anion gap: 6 (ref 5–15)
BUN: 26 mg/dL — ABNORMAL HIGH (ref 8–23)
CO2: 19 mmol/L — ABNORMAL LOW (ref 22–32)
Calcium: 7.9 mg/dL — ABNORMAL LOW (ref 8.9–10.3)
Chloride: 108 mmol/L (ref 98–111)
Creatinine, Ser: 1.64 mg/dL — ABNORMAL HIGH (ref 0.61–1.24)
GFR calc Af Amer: 50 mL/min — ABNORMAL LOW (ref >60)
GFR calc non Af Amer: 44 mL/min — ABNORMAL LOW (ref >60)
Glucose, Bld: 137 mg/dL — ABNORMAL HIGH (ref 70–99)
Potassium: 3.9 mmol/L (ref 3.5–5.1)
Sodium: 133 mmol/L — ABNORMAL LOW (ref 135–145)

## 2020-01-17 LAB — CBC
HCT: 31.4 % — ABNORMAL LOW (ref 39.0–52.0)
Hemoglobin: 10.2 g/dL — ABNORMAL LOW (ref 13.0–17.0)
MCH: 27.1 pg (ref 26.0–34.0)
MCHC: 32.5 g/dL (ref 30.0–36.0)
MCV: 83.3 fL (ref 80.0–100.0)
Platelets: 162 10*3/uL (ref 150–400)
RBC: 3.77 MIL/uL — ABNORMAL LOW (ref 4.22–5.81)
RDW: 17.2 % — ABNORMAL HIGH (ref 11.5–15.5)
WBC: 8.9 10*3/uL (ref 4.0–10.5)
nRBC: 0 % (ref 0.0–0.2)

## 2020-01-17 LAB — GLUCOSE, CAPILLARY
Glucose-Capillary: 115 mg/dL — ABNORMAL HIGH (ref 70–99)
Glucose-Capillary: 141 mg/dL — ABNORMAL HIGH (ref 70–99)
Glucose-Capillary: 139 mg/dL — ABNORMAL HIGH (ref 70–99)
Glucose-Capillary: 178 mg/dL — ABNORMAL HIGH (ref 70–99)

## 2020-01-17 LAB — URINE CULTURE: Culture: <10000 — AB

## 2020-01-17 MED ORDER — SODIUM CHLORIDE 0.9 % IV SOLN
2.0000 g | INTRAVENOUS | Status: DC
  Administered 2020-01-17: 2 g via INTRAVENOUS
  Filled 2020-01-17: qty 2

## 2020-01-17 MED ORDER — SODIUM CHLORIDE 0.9 % IV SOLN
1.0000 g | INTRAVENOUS | Status: DC
  Filled 2020-01-17: qty 10

## 2020-01-17 MED ORDER — VANCOMYCIN HCL 1250 MG/250ML IV SOLN
1250.0000 mg | INTRAVENOUS | Status: DC
  Filled 2020-01-17: qty 250

## 2020-01-17 NOTE — Evaluation (Signed)
Physical Therapy Evaluation Patient Details Name: [name] MRN: [id number] DOB: October 11, 1955 Today's Date: 01/17/2020   History of Present Illness  Pt is a 63 y.o. 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 Per MD impression, pt currently presents with sepsis due to UTI and cellulitis of sacral regio, elevated troponin likely due to demand ischemia, and acute metabolic encephalopathy.  Clinical Impression  Pt pleasant and motivated to participate during the session. Pt noted that for roughly the past week, he did not have the strength to walk throughout the home as he usually does. Prior to last week, pt seemed to express that he was nearly independent at home. Pt reported multiple falls over the weekend, one of which involved sliding off the edge of the bed. During today's session,  Pt required min assist from PT for supine exercises including SLR, hip abd/add, and heel slides with minimal noted ROM and BLE stiffness. Pt was unable to perform BLE dorsiflexion. Pt has bilateral AFOs, but reports rarely using them in home setting.Pt required max cueing for bed mobility and demonstrated minimal movement before asking for help. Once assist was provided, pt was able to go supine to sit as well as sit to supine with only min A. Pt was able to come to standing with min A and max cueing for positioning and sequencing. With cueing, pt was unable to separate BLE to create a wide BOS (in sitting or standing). Pt could only stand with significant forward trunk flexion and BUE support on RW. Pt only was able to shuffle ~68ft to the right with very small, effortful, steps. Pt will benefit from PT services in a SNF setting upon discharge to safely address deficits listed in patient problem list for decreased caregiver assistance and eventual return to PLOF.    Follow Up  Recommendations SNF    Equipment Recommendations  3in1 (PT)    Recommendations for Other Services       Precautions / Restrictions Precautions Precautions: Fall Restrictions Weight Bearing Restrictions: No Other Position/Activity Restrictions: careful scooting in bed secondary to sacral pressure ulcer      Mobility  Bed Mobility Overal bed mobility: Needs Assistance Bed Mobility: Supine to Sit;Sit to Supine     Supine to sit: Min assist Sit to supine: Min assist   General bed mobility comments: max cueing for UE and LE sequencing. Significantly increased effort noted and pt asked for help after minimal movement. Pt required min A to move BLE to/from bed  Transfers Overall transfer level: Needs assistance Equipment used: Rolling walker (2 wheeled) Transfers: Sit to/from Stand Sit to Stand: Min assist;From elevated surface         General transfer comment: max cueing for foot/hand placement and sequencing, pt unable/unwilling to create wide BOS with BLE. Min A provided to and form standing  Ambulation/Gait Ambulation/Gait assistance: Min assist Gait Distance (Feet): 1 Feet Assistive device: Rolling walker (2 wheeled) Gait Pattern/deviations: Trunk flexed;Decreased step length - right;Decreased step length - left;Shuffle;Step-to pattern;Narrow base of support Gait velocity: decreased   General Gait Details: Once in standing pt was only able to separate feet ~1in with max cueing to create a wide BOS. Pt had a significantly forward flexed trunk posture with significant cueing to stand up tall. Min A provided for upright posture with pt use of BUE on RW. Pt was able to shuffle laterally ~44ft with very small, effortful, shuffling steps.  Stairs  Wheelchair Mobility    Modified Rankin (Stroke Patients Only)       Balance Overall balance assessment: Needs assistance Sitting-balance support: Bilateral upper extremity supported;Feet supported;Feet  unsupported Sitting balance-Leahy Scale: Fair Sitting balance - Comments: Pt able to sit upright with BUE support and feet unsupported. Pt appeared mildly unsteady in sitting and on multiple occasions began to lower himself down onto an elbow for increased stability.   Standing balance support: Bilateral upper extremity supported Standing balance-Leahy Scale: Poor Standing balance comment: pt unable to create a wide BOS with BLE. Pt had a significantly forward flexed posture with BUE support on RW. Pt able to demonstrate slight knee bends in place but appeared unsteady in standing position                             Pertinent Vitals/Pain Pain Assessment: No/denies pain    Home Living Family/patient expects to be discharged to:: Private residence Living Arrangements: Spouse/significant other Available Help at Discharge: Family;Available PRN/intermittently (spouse works part time) Type of Home: House Home Access: Stairs to enter Entrance Stairs-Rails: None Secretary/administrator of Steps: 2x 1 Home Layout: One level Home Equipment: Environmental consultant - 2 wheels;Shower seat      Prior Function Level of Independence: Needs assistance   Gait / Transfers Assistance Needed: pt reports able to ambulate independently throughout home; uses a scooter if out of the home     Comments: Pt reports wound care nurse comes to help with his sacral wound MWF and that he also recieves some help getting dressed     Hand Dominance        Extremity/Trunk Assessment   Upper Extremity Assessment Upper Extremity Assessment: Generalized weakness    Lower Extremity Assessment Lower Extremity Assessment: LLE deficits/detail;RLE deficits/detail RLE Deficits / Details: no active dorsiflexion noted; otherwise generalized weakness LLE Deficits / Details: no active dorsiflexion noted; otherwise generalized weakness       Communication   Communication: No difficulties  Cognition Arousal/Alertness:  Awake/alert Behavior During Therapy: WFL for tasks assessed/performed Overall Cognitive Status: Within Functional Limits for tasks assessed                                 General Comments: hx of cognitive impairments secondary to TBI; no family present to note baseline function but appeared to have only mild cognitive impairments during today's session      General Comments      Exercises Total Joint Exercises Ankle Circles/Pumps: Other (comment) (attempted - pt no active dorsiflexion BLE) Quad Sets: Strengthening;Both;10 reps Gluteal Sets: Strengthening;Both;10 reps Heel Slides: Strengthening;Both;5 reps;AAROM Hip ABduction/ADduction: Strengthening;Both;10 reps;AAROM Straight Leg Raises: Strengthening;Both;10 reps;AAROM Long Arc Quad: AROM;Strengthening;Both;10 reps Marching in Standing: Other (comment) (attmepted - pt able to very minimally lift RLE off the ground x1)   Assessment/Plan    PT Assessment Patient needs continued PT services  PT Problem List Decreased strength;Decreased activity tolerance;Decreased balance;Decreased mobility;Decreased knowledge of use of DME       PT Treatment Interventions Functional mobility training;Therapeutic activities;Therapeutic exercise;DME instruction;Balance training;Gait training;Patient/family education    PT Goals (Current goals can be found in the Care Plan section)  Acute Rehab PT Goals Patient Stated Goal: to get stronger PT Goal Formulation: With patient Time For Goal Achievement: 01/30/20 Potential to Achieve Goals: Fair    Frequency Min 2X/week   Barriers to discharge Decreased caregiver support  Co-evaluation               AM-PAC PT "6 Clicks" Mobility  Outcome Measure Help needed turning from your back to your side while in a flat bed without using bedrails?: A Little Help needed moving from lying on your back to sitting on the side of a flat bed without using bedrails?: A Little Help  needed moving to and from a bed to a chair (including a wheelchair)?: Total Help needed standing up from a chair using your arms (e.g., wheelchair or bedside chair)?: A Little Help needed to walk in hospital room?: Total Help needed climbing 3-5 steps with a railing? : Total 6 Click Score: 12    End of Session Equipment Utilized During Treatment: Gait belt Activity Tolerance: Patient tolerated treatment well Patient left: in bed;with call bell/phone within reach;with bed alarm set;with SCD's reapplied Nurse Communication: Mobility status PT Visit Diagnosis: Muscle weakness (generalized) (M62.81);History of falling (Z91.81);Difficulty in walking, not elsewhere classified (R26.2);Unsteadiness on feet (R26.81)    Time: 3754-3606 PT Time Calculation (min) (ACUTE ONLY): 34 min   Charges:             Fifi Schindler SPT 01/17/20, 1:42 PM

## 2020-01-17 NOTE — Hospital Course (Signed)
[name] is a 64 y.o. 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   Per his wife, patient has been feeling weak in the past several days.  He moves all extremities.  No facial droop or slurred speech.  He has increased urinary frequency, does not seem to have dysuria or burning on urination.  Patient does not have chest pain or shortness breath.  He has mild cough.  Patient has subjective fever per his wife, but his temperature is 98.3 in ED.  No nausea, vomiting, diarrhea or abdominal pain.  Patient has chronic sacral wound which has been managed in [facility].  He is s/p of colostomy for voiding fecal contamination.  Patient was noted to have erythematous change around sacral wound in buttock.  Per his wife, patient has been intermittently more confused than his baseline, but currently patient's mental status has improved.  He is alert and oriented x3 when I saw patient in ED.   Patient was initially hypotensive with blood pressure 80/48, which improved to 104/54 after giving 2 L normal saline bolus in ED.   ED Course: pt was found to have WBC 12.2, lactic acid 1.2, troponin 24, 18, positive urinalysis (cloudy appearance, large amount of leukocyte, rare bacteria, WBC > 50), worsening renal function, temperature normal, heart rate 58, 92, 62, RR 17, oxygen saturation 97% on room air.  Patient is admitted to progressive bed as inpatient.

## 2020-01-17 NOTE — Consult Note (Addendum)
WOC Nurse Consult Note: Reason for Consult: Consult requested for sacrum wound.  Pt states he has been followed by the outpatient wound care center in the past and has previously worn a Vac dressing to this location.  Offered to order a low airloss mattress to reduce pressure to the site but patient declined and stated "I don't like them.".  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  Right heel with dark reddish purple deep tissue injury 3X3cm, which has evolved into a stage 2 in the center, .3X.3X.2cm, red and moist, surrounded by white macerated skin. Pressure Injury POA: Yes Dressing procedure/placement/frequency: Topical treatment orders provided for bedside nurses to perform to absorb drainage and promote healing as follows: Pack sacrum with Aquacel dressing Q day, then cover with foam dressing.  (Change foam dressing Q 3 days or PRN soiling. ) Foam dressing to right heel, change Q 3 days or PRN soiling. Float heel to reduce pressure. Pt should resume follow-up with the outpatient wound care center after discharge.   Pt has a colostomy pouch in place which is intact with good seal, date indicates it was applied 7/16. Pt is using a 2 piece pouching system.  Stoma is red and viable when visualized through the pouch.  Mod amt semiformed brown stool in the pouch.  Supplies left at the bedside for staff nurse use.  Please re-consult if further assistance is needed.  Thank-you,  Cammie Mcgee MSN, RN, CWOCN, [place], CNS [phone]

## 2020-01-17 NOTE — Progress Notes (Signed)
Please see earlier consult note for further details.  I attempted to call the patient's wife via telephone earlier today to further elucidate the patient's medical history but was unable to reach her.  I offered the patient placement of an 12Fr straight Foley catheter for the duration of his hospitalization today but he refused. Will provide 12Fr straight catheter samples from clinic for nursing to continue I&O catheterization at the bedside three times daily.  Patient reports he has been reusing catheters at home and cleaning them with hot water between uses. Likely contributory to his current UTI. Can set him up with one-time-use catheter supplies through clinic once outpatient.

## 2020-01-17 NOTE — Progress Notes (Signed)
Patient in and out cathed by [facility] today and yielded 

## 2020-01-17 NOTE — Progress Notes (Signed)
   01/17/20 0230  Clinical Encounter Type  Visited With Patient  Visit Type Initial;Spiritual support;Social support  Referral From Nurse  Consult/Referral To Chaplain  Ch responded to OR for prayer. Pt was lying in the bed. Pt told me he want prayer. I prayed for his stomach, foot and urinary system. Pt thank me and I will follow-up later.

## 2020-01-17 NOTE — Progress Notes (Addendum)
In and out cath not successful another nurse tried and was also unsuccessful. Notified Steward Drone, NP and told to inform certified nurses on 2C to perform a Coude Cath in and out. Awaiting supplies for task to be performed.

## 2020-01-17 NOTE — Progress Notes (Signed)
Pt has not voided in External catheter. Bladder scanned pt and >514 ml of urine found in bladder. Notified Steward Drone, NP and NEW order given to do an in and out cath on pt.

## 2020-01-17 NOTE — Plan of Care (Signed)
  Problem: Urinary Elimination: Goal: Signs and symptoms of infection will decrease Outcome: Progressing   Problem: Education: Goal: Knowledge of General Education information will improve Description: Including pain rating scale, medication(s)/side effects and non-pharmacologic comfort measures Outcome: Progressing   Problem: Health Behavior/Discharge Planning: Goal: Ability to manage health-related needs will improve Outcome: Progressing   Problem: Clinical Measurements: Goal: Ability to maintain clinical measurements within normal limits will improve Outcome: Progressing Goal: Will remain free from infection Outcome: Progressing Goal: Diagnostic test results will improve Outcome: Progressing Goal: Respiratory complications will improve Outcome: Progressing Goal: Cardiovascular complication will be avoided Outcome: Progressing   Problem: Activity: Goal: Risk for activity intolerance will decrease Outcome: Progressing   Problem: Nutrition: Goal: Adequate nutrition will be maintained Outcome: Progressing   Problem: Coping: Goal: Level of anxiety will decrease Outcome: Progressing   Problem: Elimination: Goal: Will not experience complications related to bowel motility Outcome: Progressing Goal: Will not experience complications related to urinary retention Outcome: Progressing   Problem: Pain Managment: Goal: General experience of comfort will improve Outcome: Progressing   Problem: Safety: Goal: Ability to remain free from injury will improve Outcome: Progressing   Problem: Skin Integrity: Goal: Risk for impaired skin integrity will decrease Outcome: Progressing   

## 2020-01-17 NOTE — Progress Notes (Signed)
[facility]  - [place] [facility]   PATIENT NAME: [name]    MR#:  [id number]  DATE OF BIRTH:  09/22/1955  SUBJECTIVE:  patient came in with generalized weakness. He is been having lately difficulty doing self catheterization. His wife has been helping him. No family in the room. No fever. Overall improving  REVIEW OF SYSTEMS:   Review of Systems  Constitutional: Positive for malaise/fatigue. Negative for chills, fever and weight loss.  HENT: Negative for ear discharge, ear pain and nosebleeds.   Eyes: Negative for blurred vision, pain and discharge.  Respiratory: Negative for sputum production, shortness of breath, wheezing and stridor.   Cardiovascular: Negative for chest pain, palpitations, orthopnea and PND.  Gastrointestinal: Negative for abdominal pain, diarrhea, nausea and vomiting.  Genitourinary: Negative for frequency and urgency.  Musculoskeletal: Negative for back pain and joint pain.  Neurological: Positive for weakness. Negative for sensory change, speech change and focal weakness.  Psychiatric/Behavioral: Negative for depression and hallucinations. The patient is not nervous/anxious.    Tolerating Diet:yes Tolerating PT:   DRUG ALLERGIES:  No Known Allergies  VITALS:  Blood pressure 114/75, pulse 69, temperature 97.7 F (36.5 C), resp. rate 17, height 5\' 6"  (1.676 m), weight 106 kg, SpO2 96 %.  PHYSICAL EXAMINATION:   Physical Exam  GENERAL:  64 y.o.-year-old patient lying in the bed with no acute distress. chornically ill EYES: Pupils equal, round, reactive to light and accommodation. No scleral icterus.   HEENT: Head atraumatic, normocephalic. Oropharynx and nasopharynx clear.  NECK:  Supple, no jugular venous distention. No thyroid enlargement, no tenderness.  LUNGS: Normal breath sounds bilaterally, no wheezing, rales, rhonchi. No use of accessory muscles of respiration.  CARDIOVASCULAR: S1, S2 normal. No murmurs, rubs,  or gallops.  ABDOMEN: Soft, nontender, nondistended. Bowel sounds present. No organomegaly or mass.  EXTREMITIES: No cyanosis, clubbing or edema b/l.    NEUROLOGIC: Cranial nerves II through XII are intact. No focal Motor or sensory deficits b/l.   PSYCHIATRIC:  patient is alert and oriented x 3.  SKIN: POA on 01/16/20      LABORATORY PANEL:  CBC Recent Labs  Lab 01/17/20 0506  WBC 8.9  HGB 10.2*  HCT 31.4*  PLT 162    Chemistries  Recent Labs  Lab 01/16/20 0942 01/16/20 0942 01/17/20 0506  NA 130*   < > 133*  K 3.8   < > 3.9  CL 100   < > 108  CO2 23   < > 19*  GLUCOSE 167*   < > 137*  BUN 30*   < > 26*  CREATININE 1.91*   < > 1.64*  CALCIUM 8.5*   < > 7.9*  AST 21  --   --   ALT 17  --   --   ALKPHOS 84  --   --   BILITOT 1.0  --   --    < > = values in this interval not displayed.   Cardiac Enzymes No results for input(s): TROPONINI in the last 168 hours. RADIOLOGY:  No results found. ASSESSMENT AND PLAN:  [name] is a 64 y.o. 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 Per his wife, patient has been feeling weak in the past several days.  He moves all extremities.  No facial droop or slurred speech.  He  has increased urinary frequency, does not seem to have dysuria or burning on urination. Pt has been doing self catheterization for many years.  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  Lactic acid is normal.  Blood pressure responded to IV fluid.  Currently hemodynamically stabilized. -now ruled out -UC <10K insignificant growth -Wound looks ok--no foul smelling odor--evaluated by wound nurse -  Empiric antimicrobial treatment with vancomycin and Rocephin to cover both UTI and sacral cellulitis--d/c vanc. 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  Right heel with dark reddish purple deep tissue injury 3X3cm, which has evolved into a stage 2 in the center, .3X.3X.2cm, red and moist, surrounded by white macerated skin. Pressure Injury POA: Yes Dressing procedure/placement/frequency: Topical treatment orders provided for bedside nurses to perform to absorb drainage and promote healing as follows: Pack sacrum with Aquacel dressing Q day, then cover with foam dressing.  (Change foam dressing Q 3 days or PRN soiling. ) Foam dressing to right heel, change Q 3 days or PRN soiling. Float heel to reduce pressure. Pt should resume follow-up with the outpatient wound care center after discharge.  - procalcitonin 0.39--1.0 -lactic acid normalized -remains afebrile ,WBC normal  Urinary retention patient has history of self catheterization since 2000. He has lately been having difficulty self catheterization. His wife has been helping. Apparently it seems patient has been reusing catheter after washing in hot water. Likely causing him to have some inflammation and colonization -seen by urology. Recommended Foley catheter. Patient refused -follow urology recommendation with using 12 French catheter for in and out cathetrization  Bipolar disorder (HCC): -Continue home Trileptal and Seroquel  Hyperlipidemia -Crestor  Hypertension: Patient is not taking medications at home -Will  Hold blood pressure medications due to hypotension  Type II diabetes mellitus with renal manifestations (HCC): recent A1c 7.0.  Patient is taking Metformin and glipizide -Sliding scale insulin  DVT (deep venous thrombosis) (HCC) -Eliquis  Elevated troponin:  Troponin 24 --> 18, already trending down.  No chest pain.  Likely nonspecific or mildly demand ischemia. -Continue Crestor  Acute renal failure superimposed on stage 3a chronic kidney disease (HCC): Baseline creatinine 1.1-1.3. -came in with creatinine is 1.91--1.6 -Avoid using renal toxic medications -received IV fluid as above -Follow-up by BMP  Acute metabolic encephalopathy: Likely due to UTI.  No focal neuro deficit on physical examination.  Already, back to baseline. -Monitor closely  Patient was seen by physical therapy. Recommends rehab. Patient says his wife is not able to help him as much as before given amount of care. He is agreeable to go to rehab. TOC consultation placed.  DVT ppx: on Eliquis Code Status: Full code Family Communication:pt Disposition Plan:  Anticipate discharge back to previous environment Consults called:  none Admission status:  progressive unit as inpt   Status is: Inpatient  Remains inpatient appropriate because:Inpatient level of care appropriate due to severity of illness.  Patient has multiple comorbidities, now presents with sepsis due to UTI and sacral cellulitis.   TOC working on discharge planning to rehab.  Dispo: The patient is from: Home  Anticipated d/c is to: rehab  Anticipated d/c date is: 1-2 2 days  Patient currently is  medically stable and improvjng. Will  d/c to rehab when bed available.          TOTAL TIME TAKING CARE OF THIS PATIENT: *25* minutes.  >50% time spent on counselling and coordination of care  Note: This dictation was prepared with Dragon dictation along with smaller phrase technology. Any transcriptional errors that result from this process are unintentional.  Enedina Finner M.D    [facility]   CC: Primary care physician; Dione Booze, MDPatient ID: [name], male   DOB: 1955/07/29, 64 y.o.   MRN: [id number]

## 2020-01-17 NOTE — Evaluation (Signed)
Occupational Therapy Evaluation Patient Details Name: [name] MRN: [id number] DOB: 02-17-1956 Today's Date: 01/17/2020    History of Present Illness Pt is a 64 y.o. 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 Per MD impression, pt currently presents with sepsis due to UTI and cellulitis of sacral regio, elevated troponin likely due to demand ischemia, and acute metabolic encephalopathy.   Clinical Impression   Pt was seen for OT evaluation this date. Prior to hospital admission, pt was requiring some assist for ADLs from spouse, but reports being able to walk with his walker. Pt lives with spouse in [facility] with 2 STE with no railing. Currently pt demonstrates impairments as described below (See OT problem list) which functionally limit his ability to perform ADL/self-care tasks. Pt currently requires MIN A for lateral rolling in bed, setup to MIN A for UB ADLs and MOD A for LB ADLs.  OT engages pt in below listed education and exercise with intermittent cues to sequence throughout. Pt would benefit from skilled OT to address noted impairments and functional limitations (see below for any additional details) in order to maximize safety and independence while minimizing falls risk and caregiver burden. Upon hospital discharge, recommend STR to maximize pt safety and return to PLOF.     Follow Up Recommendations  SNF    Equipment Recommendations  3 in 1 bedside commode    Recommendations for Other Services       Precautions / Restrictions Precautions Precautions: Fall Precaution Comments: Colostomy bag Restrictions Weight Bearing Restrictions: No Other Position/Activity Restrictions: careful scooting in bed secondary to sacral pressure ulcer      Mobility Bed Mobility Overal bed mobility: Needs Assistance Bed Mobility: Rolling Rolling: Min  assist         General bed mobility comments: pt declines to sit EOB on OT assessment-says "tomorrow"  Transfers                 General transfer comment: pt declines    Balance       Sitting balance - Comments: NT       Standing balance comment: NT                           ADL either performed or assessed with clinical judgement   ADL Overall ADL's : Needs assistance/impaired Eating/Feeding: Set up;Bed level Eating/Feeding Details (indicate cue type and reason): HOB elevated in high fowler's/supported long sitting. Setup to open packaging Grooming: Wash/dry face;Oral care;Set up;Minimal assistance;Bed level Grooming Details (indicate cue type and reason): HOB elevated in high fowler's/supported long sitting         Upper Body Dressing : Minimal assistance;Bed level Upper Body Dressing Details (indicate cue type and reason): HOB elevated in high fowler's/supported long sitting Lower Body Dressing: Moderate assistance Lower Body Dressing Details (indicate cue type and reason): rolling in bed   Toilet Transfer Details (indicate cue type and reason): NT, pt declines to get OOB on OT assessment                 Vision Baseline Vision/History: Wears glasses Wears Glasses: At all times Patient Visual Report: No change from baseline       Perception     Praxis      Pertinent Vitals/Pain Pain Assessment: No/denies pain     Hand Dominance Right   Extremity/Trunk Assessment Upper Extremity Assessment Upper  Extremity Assessment: Generalized weakness (shld ROM to 1/2 arc. elbow wrist and grip WFL. MMT of elbow and grip grossly 4-/5)   Lower Extremity Assessment Lower Extremity Assessment: Defer to PT evaluation (unable to DF, has AFO in the room for both LEs.)       Communication Communication Communication: No difficulties   Cognition Arousal/Alertness: Awake/alert Behavior During Therapy: WFL for tasks assessed/performed Overall  Cognitive Status: No family/caregiver present to determine baseline cognitive functioning                                 General Comments: Pt with some delayed responses. Repeats some answers to questions several times. He is mostly appropriate conversationally and knows month and year as well as location. Not oriented to situatuon or day/date.   General Comments       Exercises Other Exercises Other Exercises: OT facilitates education re: role of OT in acute setting. Pt is somewhat familiar d/t previous rehab stays. Other Exercises: OT facilitates pt in bed level UB exercise including straight arm raises, alternating for 10 each side and 1 set x10 reps each side of lateral reaching to improve core/oblique strength for reaching/obtaining ADL items. Pt tolerates well. MIN verbal/visual cues for form and pace.   Shoulder Instructions      Home Living Family/patient expects to be discharged to:: Private residence Living Arrangements: Spouse/significant other Available Help at Discharge: Family;Available PRN/intermittently Type of Home: House Home Access: Stairs to enter [employer] of Steps: 2x 1 Entrance Stairs-Rails: None Home Layout: One level     Bathroom Shower/Tub: Producer, television/film/video: Standard Bathroom Accessibility: Yes   Home Equipment: Environmental consultant - 2 wheels;Shower seat          Prior Functioning/Environment Level of Independence: Needs assistance  Gait / Transfers Assistance Needed: pt reports able to ambulate independently throughout home; uses a scooter if out of the home ADL's / Homemaking Assistance Needed: States that his spouse assists in med mgt, cooks and cleans, helps pt with LB dressing and some aspects of bathing.   Comments: Pt reports wound care nurse comes to help with his sacral wound MWF        OT Problem List: Decreased strength;Decreased range of motion;Decreased activity tolerance      OT  Treatment/Interventions: Self-care/ADL training;Therapeutic exercise;DME and/or AE instruction;Therapeutic activities;Patient/family education;Balance training    OT Goals(Current goals can be found in the care plan section) Acute Rehab OT Goals Patient Stated Goal: to get stronger OT Goal Formulation: With patient Time For Goal Achievement: 01/31/20 Potential to Achieve Goals: Good  OT Frequency: Min 2X/week   Barriers to D/C:            Co-evaluation              AM-PAC OT "6 Clicks" Daily Activity     Outcome Measure Help from another person eating meals?: A Little Help from another person taking care of personal grooming?: A Little Help from another person toileting, which includes using toliet, bedpan, or urinal?: A Lot Help from another person bathing (including washing, rinsing, drying)?: A Lot Help from another person to put on and taking off regular upper body clothing?: A Little Help from another person to put on and taking off regular lower body clothing?: A Lot 6 Click Score: 15   End of Session    Activity Tolerance: Patient tolerated treatment well;Other (comment) (somewhat self-limiting) Patient left:  in bed;with call bell/phone within reach;with bed alarm set  OT Visit Diagnosis: Other abnormalities of gait and mobility (R26.89);History of falling (Z91.81)                Time: 9629-5284 OT Time Calculation (min): 38 min Charges:  OT General Charges $OT Visit: 1 Visit OT Evaluation $OT Eval Moderate Complexity: 1 Mod OT Treatments $Self Care/Home Management : 8-22 mins $Therapeutic Exercise: 8-22 mins  Rejeana Brock, MS, OTR/L ascom [phone] 01/17/20, 5:07 PM

## 2020-01-17 NOTE — TOC Initial Note (Signed)
Transition of Care [facility]) - Initial/Assessment Note    Patient Details  Name: [name] MRN: [id number] Date of Birth: 1956-04-29  Transition of Care [facility]) CM/SW Contact:    Shelbie Ammons, RN Phone Number: 01/17/2020, 2:47 PM  Clinical Narrative:     RNCM met with patient at bedside, patient reports that he is feeling somewhat better today. RNCM explained role to patient and discussed that discharge planning would be discussed and recommendations provided by PT that patient go to SNF after discharge. Patient reports that he is agreeable to this plan, he reports that he has been to [facility] before and [facility] and he understands that [facility] is not an option any more and that he does not want to return to [facility]. Patient is agreeable to a bed search of other facilities in the area but reports he does not want to go to [facility] either. RNCM verified PASSR, completed FL-2 and started bed search. RNCM will follow and present bed offers when received.             Expected Discharge Plan: Skilled Nursing Facility Barriers to Discharge: Continued Medical Work up   Patient Goals and CMS Choice     Choice offered to / list presented to : Patient  Expected Discharge Plan and Services Expected Discharge Plan: [place] Acute Care Choice: [place] Living arrangements for the past 2 months: Single Family Home                                      Prior Living Arrangements/Services Living arrangements for the past 2 months: Single Family Home Lives with:: Spouse Patient language and need for interpreter reviewed:: Yes Do you feel safe going back to the place where you live?: Yes      Need for Family Participation in Patient Care: Yes (Comment) Care giver support system in place?: Yes (comment)   Criminal Activity/Legal Involvement Pertinent to Current Situation/Hospitalization: No - Comment as  needed  Activities of Daily Living Home Assistive Devices/Equipment: Grab bars around toilet, Eyeglasses, Scales, CBG Meter, Blood pressure cuff, Walker (specify type) (Rolator and front wheel walker) ADL Screening (condition at time of admission) Patient's cognitive ability adequate to safely complete daily activities?: Yes Is the patient deaf or have difficulty hearing?: No Does the patient have difficulty seeing, even when wearing glasses/contacts?: No Does the patient have difficulty concentrating, remembering, or making decisions?: Yes Patient able to express need for assistance with ADLs?: Yes Does the patient have difficulty dressing or bathing?: No (Help with colostomy) Independently performs ADLs?: Yes (appropriate for developmental age) Does the patient have difficulty walking or climbing stairs?: Yes Weakness of Legs: Both Weakness of Arms/Hands: Both  Permission Sought/Granted                  Emotional Assessment Appearance:: Appears stated age Attitude/Demeanor/Rapport: Engaged Affect (typically observed): Appropriate, Accepting Orientation: : Oriented to Self, Oriented to Place, Oriented to  Time, Oriented to Situation Alcohol / Substance Use: Not Applicable Psych Involvement: No (comment)  Admission diagnosis:  Weakness [R53.1] Wound infection [T14.8XXA, L08.9] Acute cystitis without hematuria [N30.00] Sepsis ([place]) [A41.9] Patient Active Problem List   Diagnosis Date Noted  . Pressure injury of skin 01/17/2020  . DVT (deep venous thrombosis) ([place]) 01/16/2020  . UTI (urinary tract infection) 01/16/2020  . Sepsis ([place]) 01/16/2020  .  Cellulitis of sacral region 01/16/2020  . Elevated troponin 01/16/2020  . HLD (hyperlipidemia) 01/16/2020  . Acute renal failure superimposed on stage 3a chronic kidney disease ([place]) 01/16/2020  . Acute metabolic encephalopathy 26/94/8546  . Bipolar disorder, in full remission, most recent episode mixed ([place]) 12/29/2019  .  High risk medication use 10/25/2019  . Noncompliance with treatment plan 10/25/2019  . Bipolar I disorder, most recent episode mixed ([place]) 01/07/2019  . GAD (generalized anxiety disorder) 01/07/2019  . Insomnia due to medical condition 01/07/2019  . DVT, recurrent, lower extremity, acute ([place]) 03/06/2016  . Bipolar disorder ([place]) 01/03/2016  . Lithium toxicity 01/02/2016  . Chronic anticoagulation 07/06/2014  . 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 Hyperlipidemia 07/06/2014  . Hypertension 07/06/2014  . Leg swelling 07/06/2014  . Obesity, Class II, BMI 35-39.9, with comorbidity 07/06/2014  . On medication for venous thromboembolism 07/06/2014  . Sleep apnea 07/06/2014  . Type II diabetes mellitus with renal manifestations ([place]) 07/06/2014  . 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 Venous thromboembolism (VTE) 07/06/2014   PCP:  [name], MD Pharmacy:   [facility] [address], [place] - [place] [address] [place] [address] Phone: [phone] Fax: [phone]     Social Determinants of Health (SDOH) Interventions    Readmission Risk Interventions No flowsheet data found.

## 2020-01-17 NOTE — Progress Notes (Signed)
In and out cath yielded .  Colostomy bag emptied.  Orders to in/out cath TID (q8hrs).  Supplies are in patients room.
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
## 2020-01-17 NOTE — Consult Note (Signed)
Pharmacy Antibiotic Note  [name] is a 64 y.o. male admitted on 01/16/2020 with sepsis, apparently from cystitis and cellulitis.  Pharmacy has been consulted for vancomycin dosing.  Plan:  Pt received a total of vancomycin 2000 mg loading dose. Adjusted vancomycin maintenance dose to 1250 mg daily per [place] nomogram. Scr is trending down. Plan to obtain level in 4-5 days.   On ceftriaxone for UTI and cellulitis. Adjusted to 2 g daily for BMI > 30.    Height: 5\' 6"  (167.6 cm) Weight: 106 kg (233 lb 11 oz) IBW/kg (Calculated) : 63.8  Temp (24hrs), Avg:98.2 F (36.8 C), Min:97.9 F (36.6 C), Max:98.5 F (36.9 C)  Recent Labs  Lab 01/16/20 0942 01/16/20 1300 01/16/20 1607 01/17/20 0506  WBC 12.2*  --   --  8.9  CREATININE 1.91*  --   --  1.64*  LATICACIDVEN  --  1.2 1.0  --     Estimated Creatinine Clearance: 51.9 mL/min (A) (by C-G formula based on SCr of 1.64 mg/dL (H)).    No Known Allergies  Antimicrobials this admission: ceftriaxone 7/18 >>  vanomycin 7/18 >>   Microbiology results: 7/18 BCx: pending 7/18 UCx: pending  7/18 SARS CoV-2: pending   Thank you for allowing pharmacy to be a part of this patient's care.  8/18, PharmD, BCPS 01/17/2020 8:49 AM

## 2020-01-17 NOTE — NC FL2 (Signed)
[place] MEDICAID FL2 LEVEL OF CARE SCREENING TOOL     IDENTIFICATION  Patient Name: [name] Birthdate: October 01, 1955 Sex: male Admission Date (Current Location): 01/16/2020  [place] and [place] Number:  Chiropodist and Address:  [facility], [address], [place], [place] [address]      Provider Number: [id number]  Attending Physician Name and Address:  Enedina Finner, MD  Relative Name and Phone Number:  Maika Kaczmarek [phone]    Current Level of Care: Hospital Recommended Level of Care: Skilled Nursing Facility Prior Approval Number:    Date Approved/Denied:   PASRR Number: [id number]  Discharge Plan: SNF    Current Diagnoses: Patient Active Problem List   Diagnosis Date Noted  . Pressure injury of skin 01/17/2020  . DVT (deep venous thrombosis) (HCC) 01/16/2020  . UTI (urinary tract infection) 01/16/2020  . Sepsis (HCC) 01/16/2020  . Cellulitis of sacral region 01/16/2020  . Elevated troponin 01/16/2020  . HLD (hyperlipidemia) 01/16/2020  . Acute renal failure superimposed on stage 3a chronic kidney disease (HCC) 01/16/2020  . Acute metabolic encephalopathy 01/16/2020  . Bipolar disorder, in full remission, most recent episode mixed (HCC) 12/29/2019  . High risk medication use 10/25/2019  . Noncompliance with treatment plan 10/25/2019  . Bipolar I disorder, most recent episode mixed (H[facility]) 01/07/2019  . GAD (generalized anxiety disorder) 01/07/2019  . Insomnia due to medical condition 01/07/2019  . DVT, recurrent, lower extremity, acute (HCC) 03/06/2016  . Bipolar disorder (H[facility]) 01/03/2016  . Lithium toxicity 01/02/2016  . Chronic anticoagulation 07/06/2014  . 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 Hyperlipidemia 07/06/2014  . Hypertension 07/06/2014  . Leg swelling 07/06/2014  . Obesity, Class II, BMI 35-39.9, with comorbidity 07/06/2014  . On  medication for venous thromboembolism 07/06/2014  . Sleep apnea 07/06/2014  . Type II diabetes mellitus with renal manifestations (HCC) 07/06/2014  . 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 Venous thromboembolism (VTE) 07/06/2014    Orientation RESPIRATION BLADDER Height & Weight     Self, Time, Situation, Place  Normal  (intermittant catheterization) Weight: 106 kg Height:  5\' 6"  (167.6 cm)  BEHAVIORAL SYMPTOMS/MOOD NEUROLOGICAL BOWEL NUTRITION STATUS      Colostomy Diet (Heart Healthy, Carb Modified)  AMBULATORY STATUS COMMUNICATION OF NEEDS Skin   Extensive Assist Verbally PU Stage and Appropriate Care       PU Stage 4 Dressing: Daily               Personal Care Assistance Level of Assistance  Bathing, Feeding, Dressing Bathing Assistance: Limited assistance Feeding assistance: Independent Dressing Assistance: Limited assistance     Functional Limitations Info  Sight, Speech, Hearing Sight Info: Adequate Hearing Info: Adequate Speech Info: Adequate    SPECIAL CARE FACTORS FREQUENCY  PT (By licensed PT), OT (By licensed OT)                    Contractures Contractures Info: Not present    Additional Factors Info  Code Status, Allergies Code Status Info: Full Allergies Info: No known allergies           Current Medications (01/17/2020):  This is the current hospital active medication list Current Facility-Administered Medications  Medication Dose Route Frequency Provider Last Rate Last Admin  . 0.9 %  sodium chloride infusion   Intravenous Continuous 01/19/2020, MD 100 mL/hr at 01/16/20 2052 New Bag at 01/16/20 2052  . acetaminophen (TYLENOL) tablet 650 mg  650 mg  Oral Q6H PRN Lorretta Harp, MD      . apixaban Everlene Balls) tablet 5 mg  5 mg Oral BID Lorretta Harp, MD   5 mg at 01/17/20 0918  . cefTRIAXone (ROCEPHIN) 2 g in sodium chloride 0.9 % 100 mL IVPB  2 g Intravenous Q24H Esaw Grandchild A, DO      . insulin aspart (novoLOG) injection 0-5 Units   0-5 Units Subcutaneous QHS Lorretta Harp, MD      . insulin aspart (novoLOG) injection 0-9 Units  0-9 Units Subcutaneous TID WC Lorretta Harp, MD   1 Units at 01/17/20 1204  . ondansetron (ZOFRAN) injection 4 mg  4 mg Intravenous Q8H PRN Lorretta Harp, MD      . Oxcarbazepine (TRILEPTAL) tablet 300 mg  300 mg Oral TID Lorretta Harp, MD   300 mg at 01/17/20 0918  . QUEtiapine (SEROQUEL) tablet 100 mg  100 mg Oral QHS Lorretta Harp, MD   100 mg at 01/16/20 2250  . QUEtiapine (SEROQUEL) tablet 50 mg  50 mg Oral BID PRN Lorretta Harp, MD      . rosuvastatin (CRESTOR) tablet 20 mg  20 mg Oral Daily Lorretta Harp, MD   20 mg at 01/17/20 0918  . sodium chloride 0.9 % bolus 500 mL  500 mL Intravenous Once Lorretta Harp, MD      . sodium chloride flush (NS) 0.9 % injection 3 mL  3 mL Intravenous Once Lorretta Harp, MD      . vancomycin (VANCOREADY) IVPB 1250 mg/250 mL  1,250 mg Intravenous Q24H Ronnald Ramp, [facility]         Discharge Medications: Please see discharge summary for a list of discharge medications.  Relevant Imaging Results:  Relevant Lab Results:   Additional Information SS# [id number]  Trenton Founds, RN

## 2020-01-18 DIAGNOSIS — I10 Essential (primary) hypertension: Secondary | ICD-10-CM

## 2020-01-18 DIAGNOSIS — L89159 Pressure ulcer of sacral region, unspecified stage: Secondary | ICD-10-CM

## 2020-01-18 LAB — GLUCOSE, CAPILLARY
Glucose-Capillary: 127 mg/dL — ABNORMAL HIGH (ref 70–99)
Glucose-Capillary: 139 mg/dL — ABNORMAL HIGH (ref 70–99)
Glucose-Capillary: 132 mg/dL — ABNORMAL HIGH (ref 70–99)
Glucose-Capillary: 142 mg/dL — ABNORMAL HIGH (ref 70–99)

## 2020-01-18 LAB — SARS CORONAVIRUS 2 BY RT PCR (HOSPITAL ORDER, PERFORMED IN ~~LOC~~ HOSPITAL LAB): SARS Coronavirus 2: NEGATIVE

## 2020-01-18 NOTE — Progress Notes (Signed)
Per conversation with Dr. Allena Katz, patient is expected to discharge to SNF today. We are placing an order today with [facility] for 12Fr straight I&O catheters for delivery to the patient's home. Wife to supply to facility.  Will arrange outpatient follow-up with Dr. Richardo Hanks for cystoscopy to evaluate likely urethral stricture.

## 2020-01-18 NOTE — Progress Notes (Signed)
[facility]  - [place] [facility]   PATIENT NAME: [name]    MR#:  [id number]  DATE OF BIRTH:  [birth year]  SUBJECTIVE:  patient came in with generalized weakness. He is been having lately difficulty doing self catheterization. His wife has been helping him. No family in the room. No fever. Overall improving  Patient had refused placing a Foley catheter yesterday.   REVIEW OF SYSTEMS:   Review of Systems  Constitutional: Positive for malaise/fatigue. Negative for chills, fever and weight loss.  HENT: Negative for ear discharge, ear pain and nosebleeds.   Eyes: Negative for blurred vision, pain and discharge.  Respiratory: Negative for sputum production, shortness of breath, wheezing and stridor.   Cardiovascular: Negative for chest pain, palpitations, orthopnea and PND.  Gastrointestinal: Negative for abdominal pain, diarrhea, nausea and vomiting.  Genitourinary: Negative for frequency and urgency.  Musculoskeletal: Negative for back pain and joint pain.  Neurological: Positive for weakness. Negative for sensory change, speech change and focal weakness.  Psychiatric/Behavioral: Negative for depression and hallucinations. The patient is not nervous/anxious.    Tolerating Diet:yes Tolerating PT: recommends rehab  DRUG ALLERGIES:  No Known Allergies  VITALS:  Blood pressure 129/78, pulse (!) 52, temperature (!) 97.5 F (36.4 C), temperature source Oral, resp. rate 17, height 5\' 6"  (1.676 m), weight 105 kg, SpO2 100 %.  PHYSICAL EXAMINATION:   Physical Exam  GENERAL:  64 y.o.-year-old patient lying in the bed with no acute distress. chornically ill EYES: Pupils equal, round, reactive to light and accommodation. No scleral icterus.   HEENT: Head atraumatic, normocephalic. Oropharynx and nasopharynx clear.  NECK:  Supple, no jugular venous distention. No thyroid enlargement, no tenderness.  LUNGS: Normal breath sounds bilaterally, no wheezing,  rales, rhonchi. No use of accessory muscles of respiration.  CARDIOVASCULAR: S1, S2 normal. No murmurs, rubs, or gallops.  ABDOMEN: Soft, nontender, nondistended. Bowel sounds present. No organomegaly or mass.  EXTREMITIES: No cyanosis, clubbing or edema b/l.    NEUROLOGIC: Cranial nerves II through XII are intact. No focal Motor or sensory deficits b/l.   PSYCHIATRIC:  patient is alert and oriented x 3.  SKIN: POA on 01/16/20      LABORATORY PANEL:  CBC Recent Labs  Lab 01/17/20 0506  WBC 8.9  HGB 10.2*  HCT 31.4*  PLT 162    Chemistries  Recent Labs  Lab 01/16/20 0942 01/16/20 0942 01/17/20 0506  NA 130*   < > 133*  K 3.8   < > 3.9  CL 100   < > 108  CO2 23   < > 19*  GLUCOSE 167*   < > 137*  BUN 30*   < > 26*  CREATININE 1.91*   < > 1.64*  CALCIUM 8.5*   < > 7.9*  AST 21  --   --   ALT 17  --   --   ALKPHOS 84  --   --   BILITOT 1.0  --   --    < > = values in this interval not displayed.   Cardiac Enzymes No results for input(s): TROPONINI in the last 168 hours. RADIOLOGY:  No results found. ASSESSMENT AND PLAN:  [name] is a 64 y.o. 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 Per his wife, patient has been feeling weak in the past  several days.  He moves all extremities.  No facial droop or slurred speech.  He has increased urinary frequency, does not seem to have dysuria or burning on urination. Pt has been doing self catheterization for many years.  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  Lactic acid is normal.  Blood pressure responded to IV fluid.  Currently hemodynamically stabilized. 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  Right heel with dark reddish purple deep tissue injury 3X3cm, which has evolved into a stage 2 in the center, .3X.3X.2cm, red and moist, surrounded by white macerated skin. Pressure Injury POA: Yes Dressing procedure/placement/frequency: Topical treatment orders provided for bedside nurses to perform to absorb drainage and promote healing as follows: Pack sacrum with Aquacel dressing Q day, then cover with foam dressing.  (Change foam dressing Q 3 days or PRN soiling. ) Foam dressing to right heel, change Q 3 days or PRN soiling. Float heel to reduce pressure. Pt should resume follow-up with the outpatient wound care center after discharge.  - procalcitonin 0.39--1.0 -lactic acid normalized -remains afebrile ,WBC normal  Urinary retention/ ?urethral stricture patient has history of self catheterization since 2000. He has lately been having difficulty self catheterization. His wife has been helping. Apparently it seems patient has been reusing catheter after washing in hot water. Likely causing him to have some inflammation and colonization -seen by urology. Recommended Foley catheter. Patient refused -follow urology recommendation with using 12 French catheter for in and out cathetrization 7/20>. Discussed with patient's wife on the phone. Patient is now agreeable to get a Foley catheter placed how were urology PA is not able to find 12 French catheter. -Will continue self catheterization in and out for now. Patient will need to follow-up with urology for possible urethral  stricture.  Bipolar disorder (HCC): -Continue home Trileptal and Seroquel  Hyperlipidemia -Crestor  Hypertension: Patient is not taking medications at home -Will  Hold blood pressure medications due to hypotension  Type II diabetes mellitus with renal manifestations (HCC): recent A1c 7.0.  Patient is taking Metformin and glipizide -Sliding scale insulin  DVT (deep venous thrombosis) (HCC) -Eliquis  Elevated troponin: Troponin 24 --> 18, already trending down.  No chest pain.  Likely nonspecific or mildly demand ischemia. -Continue Crestor  Acute renal failure superimposed on stage 3a chronic kidney disease (HCC): Baseline creatinine 1.1-1.3. -came in with creatinine is 1.91--1.6 -Avoid using renal toxic medications -received IV fluid as above  Acute metabolic encephalopathy:   No focal neuro deficit on physical examination.  Already, back to baseline.   Patient was seen by physical therapy. Recommends rehab.  DVT ppx: on Eliquis Code Status: Full code Family Communication:pt Disposition Plan:   rehab when bed and insurance authorization available. Consults called:  none Admission status:  progressive unit as inpt   Status is: Inpatient  TOC working on discharge planning to rehab.  Dispo: The patient is from: Home  Anticipated d/c is to: rehab  Anticipated d/c date is: when bed available  Patient currently is  medically  stable and improvjng. Will d/c to rehab when bed available.          TOTAL TIME TAKING CARE OF THIS PATIENT: *25* minutes.  >50% time spent on counselling and coordination of care  Note: This dictation was prepared with Dragon dictation along with smaller phrase technology. Any transcriptional errors that result from this process are unintentional.  Enedina Finner M.D    [facility]   CC: Primary care physician; Dione Booze, MDPatient ID: [name], male   DOB: [birth year],  64 y.o.   MRN: [id number]

## 2020-01-19 DIAGNOSIS — N179 Acute kidney failure, unspecified: Secondary | ICD-10-CM

## 2020-01-19 DIAGNOSIS — N3000 Acute cystitis without hematuria: Secondary | ICD-10-CM

## 2020-01-19 DIAGNOSIS — A419 Sepsis, unspecified organism: Secondary | ICD-10-CM

## 2020-01-19 DIAGNOSIS — I825Z9 Chronic embolism and thrombosis of unspecified deep veins of unspecified distal lower extremity: Secondary | ICD-10-CM

## 2020-01-19 DIAGNOSIS — R6520 Severe sepsis without septic shock: Secondary | ICD-10-CM

## 2020-01-19 DIAGNOSIS — N1831 Chronic kidney disease, stage 3a: Secondary | ICD-10-CM

## 2020-01-19 DIAGNOSIS — E785 Hyperlipidemia, unspecified: Secondary | ICD-10-CM

## 2020-01-19 DIAGNOSIS — G9341 Metabolic encephalopathy: Secondary | ICD-10-CM

## 2020-01-19 DIAGNOSIS — R778 Other specified abnormalities of plasma proteins: Secondary | ICD-10-CM

## 2020-01-19 DIAGNOSIS — E1129 Type 2 diabetes mellitus with other diabetic kidney complication: Secondary | ICD-10-CM

## 2020-01-19 DIAGNOSIS — F3170 Bipolar disorder, currently in remission, most recent episode unspecified: Secondary | ICD-10-CM

## 2020-01-19 LAB — GLUCOSE, CAPILLARY
Glucose-Capillary: 100 mg/dL — ABNORMAL HIGH (ref 70–99)
Glucose-Capillary: 140 mg/dL — ABNORMAL HIGH (ref 70–99)

## 2020-01-19 NOTE — Care Management Important Message (Signed)
Important Message  Patient Details  Name: [name] MRN: [id number] Date of Birth: 1955/08/31   Medicare Important Message Given:  Yes     [name] 01/19/2020, 11:49 AM

## 2020-01-19 NOTE — Progress Notes (Signed)
Report called to Marylene Land at [facility]. Wife will transport to the facility

## 2020-01-19 NOTE — Progress Notes (Signed)
[name] [place] to be D/C'd Rehab ([employer]) per MD order. Report called to Marylene Land at [facility], supply company called per agent will be shipping supply to pts wife address. The rest of straight caths sent with pt. Reiterate the importance to f/u at urology office. Allergies as of 01/19/2020   No Known Allergies     Medication List    TAKE these medications   Eliquis 5 MG Tabs tablet Generic drug: apixaban Take 5 mg by mouth 2 (two) times daily.   glipiZIDE 10 MG 24 hr tablet Commonly known as: GLUCOTROL XL Take 10 mg by mouth daily with breakfast.   metFORMIN 500 MG tablet Commonly known as: GLUCOPHAGE Take 500 mg by mouth daily.   Oxcarbazepine 300 MG tablet Commonly known as: TRILEPTAL Take 1 tablet (300 mg total) by mouth 3 (three) times daily.   QUEtiapine 100 MG tablet Commonly known as: SEROquel Take 1 tablet (100 mg total) by mouth at bedtime.   QUEtiapine 50 MG tablet Commonly known as: SEROquel Take 1 tablet (50 mg total) by mouth 2 (two) times daily as needed. For severe anxiety and agitation   rosuvastatin 20 MG tablet Commonly known as: CRESTOR Take 20 mg by mouth daily.            Discharge Care Instructions  (From admission, onward)         Start     Ordered   01/19/20 0000  Discharge wound care:       Comments: Pack sacrum with Aquacel dressing daily then cover with foam dressing.  Change foam dressing every 3 days or as needed with soiling.  Foam dressing to right heel change every 3 days or as needed with soiling.   01/19/20 1123          Vitals:   01/19/20 0756 01/19/20 1202  BP: 138/67 136/79  Pulse: 64 (!) 56  Resp:    Temp: 98.5 F (36.9 C) 98.7 F (37.1 C)  SpO2: 100% 99%    Tele box removed and returned, Skin clean, dry and intact without evidence of skin break down, no evidence of skin tears noted. IV catheter discontinued intact. Site without signs and symptoms of complications. Dressing and pressure  applied. Pt denies pain at this time. No complaints noted.  An After Visit Summary was printed and given to the patient. Patient escorted via WC, and D/C to [facility] via private auto by wife.  Rigoberto Noel

## 2020-01-19 NOTE — TOC Transition Note (Signed)
Transition of Care [facility]) - CM/SW Discharge Note   Patient Details  Name: [name] MRN: [id number] Date of Birth: 05-Jan-1956  Transition of Care [facility]) CM/SW Contact:  Maree Krabbe, LCSW Phone Number: 01/19/2020, 12:37 PM   Clinical Narrative:   Clinical Social Worker facilitated patient discharge including contacting patient family and facility to confirm patient discharge plans.  Clinical information faxed to facility and family agreeable with plan.  Pt's spouse will transport patient to [facility].  RN to call for report prior to discharge.     Final next level of care: Skilled Nursing Facility Barriers to Discharge: No Barriers Identified   Patient Goals and CMS Choice     Choice offered to / list presented to : Patient  Discharge Placement              Patient chooses bed at: [facility] Patient to be transferred to facility by: Spouse personal vehicle Name of family member notified: Spouse Patient and family notified of of transfer: 01/19/20  Discharge Plan and Services     Post Acute Care Choice: Skilled Nursing Facility                               Social Determinants of Health (SDOH) Interventions     Readmission Risk Interventions No flowsheet data found.

## 2020-01-19 NOTE — Discharge Summary (Signed)
Physician Discharge Summary  [name] IWL:[id number] DOB: [birth year] DOA: 01/16/2020  PCP: Dione Booze, MD  Admit date: 01/16/2020 Discharge date: 01/19/2020  Admitted From: Home Disposition:  SNF  Recommendations for Outpatient Follow-up:  1. Follow up with PCP in 1-2 weeks 2. Follow-up with urology, Dr. Richardo Hanks as scheduled for cystoscopy 3. Please obtain BMP/CBC in one week  Home Health: No Equipment/Devices: None  Discharge Condition: Stable CODE STATUS: Full code Diet recommendation: Heart healthy/consistent carbohydrate diet  History of present illness:  [name] is a 64 y.o. 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  Per his wife, patient has been feeling weak in the past several days.  He moves all extremities.  No facial droop or slurred speech.  He has increased urinary frequency, does not seem to have dysuria or burning on urination.  Patient does not have chest pain or shortness breath.  He has mild cough.  Patient has subjective fever per his wife, but his temperature is 98.3 in ED.  No nausea, vomiting, diarrhea or abdominal pain.  Patient has chronic sacral wound which has been managed in [facility].  He is s/p of colostomy for voiding fecal contamination.  Patient was noted to have erythematous change around sacral wound in buttock.  Per his wife, patient has been intermittently more confused than his baseline, but currently patient's mental status has improved.  He is alert and oriented x3 when I saw patient in ED.  Patient was initially hypotensive with blood pressure 80/48, which improved to 104/54 after giving 2 L normal saline bolus in ED.  Patient was found to have WBC 12.2, lactic acid 1.2, troponin 24, 18,  positive urinalysis (cloudy appearance, large amount of leukocyte, rare bacteria, WBC > 50), worsening renal function, temperature normal, heart rate 58, 92, 62, RR 17, oxygen saturation 97% on room air.  Patient is admitted to progressive bed as inpatient.  Hospital course:  Sepsis due to UTI versus cellulitis/sacral decubitus ulcer Patient initially presenting to the hospital with weakness, leukocytosis, hypotension with underlying sepsis with presumed UTI with history of urinary retention versus cellulitis surrounding sacral decubitus ulcer.  Patient was initially started on empiric antibiotic therapy with vancomycin and ceftriaxone in which he received a 3-day course.  Urine culture with insignificant growth.  Blood cultures remain negative during hospitalization with resolution of his leukocytosis and hypotension.  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 Right heel with dark reddish purple deep tissue injury 3X3cm, which has evolved into a stage 2 in the center, .3X.3X.2cm, red and moist, surrounded by white macerated skin. Dressing procedure/placement/frequency:Topical treatment orders provided for bedside nurses to perform to absorb drainage and promote healing as follows:Pack sacrum with Aquacel dressing Q day, then cover with foam dressing. (Change foam dressing Q 3 days or PRN soiling. ) Foam dressing to right heel, change Q 3 days or PRN soiling. Float heel to reduce pressure. Pt should resume follow-up with the outpatient wound care center after discharge.   Acute on chronic CKD stage III day Urinary retention/ ?urethral stricture patient has history of self catheterization since 2000; but reports lately been having difficulty self catheterization. His wife has been helping. Apparently it  seems patient has been  reusing catheter after washing in hot water; likely causing him to have some inflammation and colonization.  Patient was seen by urology, with recommendations of in and out catheterizations using 12 French Foley catheter as needed and to follow-up outpatient urology for consideration of cystoscopy for evaluation of possible urethral stricture.  Bipolar disorder: Continue home Trileptal and Seroquel  Hyperlipidemia: Crestor  Hypertension:Patient is not taking medications at home.  Blood pressure 138/76, fairly well controlled.  Continue to monitor blood pressure closely outpatient.  Type II diabetes mellitus with renal manifestations: Recent A1c 7.0.Continue home Metformin and glipizide  DVT (deep venous thrombosis): Continue home Eliquis  Elevated troponin:  Troponin24 -->18, no chest pain. Likely nonspecific or mildly demand ischemia from hypotension on admission which is now resolved. Continue Crestor  Acute renal failure superimposed on stage 3a chronic kidney disease (HCC): Creatinine on admission 1.91; improved to 1.64 at time of discharge.  Baseline creatinine 1.1-1.3.  Recommend repeat BMP 1 week following discharge.  Acute metabolic encephalopathy:  No focal neuro deficit on physical examination and now back to baseline.  Discharge Diagnoses:  Active Problems:   Bipolar disorder (HCC)   Hyperlipidemia   Hypertension   Type II diabetes mellitus with renal manifestations (HCC)   DVT (deep venous thrombosis) (HCC)   Cellulitis of sacral region   HLD (hyperlipidemia)   Pressure injury of skin    Discharge Instructions  Discharge Instructions    Call MD for:  difficulty breathing, headache or visual disturbances   Complete by: As directed    Call MD for:  extreme fatigue   Complete by: As directed    Call MD for:  persistant dizziness or light-headedness   Complete by: As directed    Call MD for:  persistant nausea and vomiting   Complete by: As  directed    Call MD for:  severe uncontrolled pain   Complete by: As directed    Call MD for:  temperature >100.4   Complete by: As directed    Diet - low sodium heart healthy   Complete by: As directed    Discharge wound care:   Complete by: As directed    Pack sacrum with Aquacel dressing daily then cover with foam dressing.  Change foam dressing every 3 days or as needed with soiling.  Foam dressing to right heel change every 3 days or as needed with soiling.   Increase activity slowly   Complete by: As directed      Allergies as of 01/19/2020   No Known Allergies     Medication List    TAKE these medications   Eliquis 5 MG Tabs tablet Generic drug: apixaban Take 5 mg by mouth 2 (two) times daily.   glipiZIDE 10 MG 24 hr tablet Commonly known as: GLUCOTROL XL Take 10 mg by mouth daily with breakfast.   metFORMIN 500 MG tablet Commonly known as: GLUCOPHAGE Take 500 mg by mouth daily.   Oxcarbazepine 300 MG tablet Commonly known as: TRILEPTAL Take 1 tablet (300 mg total) by mouth 3 (three) times daily.   QUEtiapine 100 MG tablet Commonly known as: SEROquel Take 1 tablet (100 mg total) by mouth at bedtime.   QUEtiapine 50 MG tablet Commonly known as: SEROquel Take 1 tablet (50 mg total) by mouth 2 (two) times daily as needed. For severe anxiety and agitation   rosuvastatin 20 MG tablet Commonly known as: CRESTOR Take 20 mg by mouth daily.  Discharge Care Instructions  (From admission, onward)         Start     Ordered   01/19/20 0000  Discharge wound care:       Comments: Pack sacrum with Aquacel dressing daily then cover with foam dressing.  Change foam dressing every 3 days or as needed with soiling.  Foam dressing to right heel change every 3 days or as needed with soiling.   01/19/20 1123          Follow-up Information    Dione Booze, MD. Schedule an appointment as soon as possible for a visit in 1 week(s).   Specialty: Internal  Medicine Contact information: [address] [place] [address] [phone]        Sondra Come, MD. Schedule an appointment as soon as possible for a visit in 1 week(s).   Specialty: Urology Contact information: [address] [place] [address] [phone]              No Known Allergies  Consultations:  Urology, Dr. Richardo Hanks   Procedures/Studies:  No results found.   Subjective: Patient seen and examined bedside, resting comfortably.  Reports some continued overflow incontinence, otherwise no other specific complaints this morning.  Discharging to SNF today.  Urology plans outpatient follow-up with cystoscopy to evaluate for urethral stricture.  Patient denies headache, no fever/chills/night sweats, no nausea/vomiting/diarrhea, no chest pain, palpitations, no shortness of breath, no abdominal pain, no weakness.  No acute events overnight per nursing staff.  Discharge Exam: Vitals:   01/19/20 0546 01/19/20 0756  BP: 138/76 138/67  Pulse: 61 64  Resp: 19   Temp: (!) 97.5 F (36.4 C) 98.5 F (36.9 C)  SpO2: 100% 100%   Vitals:   01/18/20 1606 01/18/20 1921 01/19/20 0546 01/19/20 0756  BP: (!) 141/90 138/78 138/76 138/67  Pulse: (!) 57 (!) 55 61 64  Resp: Temp: 98.5 F (36.9 C) 98.6 F (37 C) (!) 97.5 F (36.4 C) 98.5 F (36.9 C)  TempSrc: Oral Oral Oral Oral  SpO2: 100% 100% 100% 100%  Weight:   107.6 kg   Height:        General: Pt is alert, awake, not in acute distress Cardiovascular: RRR, S1/S2 +, no rubs, no gallops Respiratory: CTA bilaterally, no wheezing, no rhonchi Abdominal: Soft, NT, ND, bowel sounds +.  Ostomy noted Extremities: no edema, no cyanosis Skin: See pictures below          The results of significant diagnostics from this hospitalization (including imaging, microbiology, ancillary and laboratory) are listed below for reference.     Microbiology: Recent Results (from the past 240  hour(s))  Urine culture     Status: Abnormal   Collection Time: 01/16/20  1:00 PM   Specimen: Urine, Random  Result Value Ref Range Status   Specimen Description   Final    URINE, RANDOM Performed at [facility], [address] [place], [place] [address]    Special Requests   Final    NONE Performed at [facility], [address], [place], [place] [address]    Culture (A)  Final    <10,000 COLONIES/mL INSIGNIFICANT GROWTH Performed at [facility], [address], [place], [place] [address]    Report Status 01/17/2020 FINAL  Final  Culture, blood (routine x 2)     Status: None (Preliminary result)   Collection Time: 01/16/20  4:13 PM   Specimen:  BLOOD  Result Value Ref Range Status   Specimen Description BLOOD RAC  Final   Special Requests   Final    BOTTLES DRAWN AEROBIC AND ANAEROBIC Blood Culture adequate volume   Culture   Final    NO GROWTH 3 DAYS Performed at [facility], [address] [place], [place] [address]    Report Status PENDING  Incomplete  Culture, blood (routine x 2)     Status: None (Preliminary result)   Collection Time: 01/16/20  4:16 PM   Specimen: BLOOD  Result Value Ref Range Status   Specimen Description BLOOD BLRA  Final   Special Requests   Final    BOTTLES DRAWN AEROBIC AND ANAEROBIC Blood Culture adequate volume   Culture   Final    NO GROWTH 3 DAYS Performed at [facility], [address] [place], [place] [address]    Report Status PENDING  Incomplete  SARS Coronavirus 2 by RT PCR (hospital order, performed in [facility] hospital lab) Nasopharyngeal Nasopharyngeal Swab     Status: None   Collection Time: 01/16/20  4:30 PM   Specimen: Nasopharyngeal Swab  Result Value Ref Range Status   SARS Coronavirus 2 NEGATIVE NEGATIVE Final    Comment: (NOTE) SARS-CoV-2 target nucleic acids are NOT DETECTED.  The SARS-CoV-2 RNA is generally detectable in upper and lower respiratory specimens  during the acute phase of infection. The lowest concentration of SARS-CoV-2 viral copies this assay can detect is 250 copies / mL. A negative result does not preclude SARS-CoV-2 infection and should not be used as the sole basis for treatment or other patient management decisions.  A negative result may occur with improper specimen collection / handling, submission of specimen other than nasopharyngeal swab, presence of viral mutation(s) within the areas targeted by this assay, and inadequate number of viral copies (<250 copies / mL). A negative result must be combined with clinical observations, patient history, and epidemiological information.  Fact Sheet for Patients:   BoilerBrush.com.cy  Fact Sheet for Healthcare Providers: https://pope.com/  This test is not yet approved or  cleared by the [place] FDA and has been authorized for detection and/or diagnosis of SARS-CoV-2 by FDA under an Emergency Use Authorization (EUA).  This EUA will remain in effect (meaning this test can be used) for the duration of the COVID-19 declaration under Section 564(b)(1) of the Act, 21 U.S.C. section 360bbb-3(b)(1), unless the authorization is terminated or revoked sooner.  Performed at [facility], [address] [place], [place] [address]   SARS Coronavirus 2 by RT PCR (hospital order, performed in [facility] hospital lab) Nasopharyngeal Nasopharyngeal Swab     Status: None   Collection Time: 01/18/20  1:24 PM   Specimen: Nasopharyngeal Swab  Result Value Ref Range Status   SARS Coronavirus 2 NEGATIVE NEGATIVE Final    Comment: (NOTE) SARS-CoV-2 target nucleic acids are NOT DETECTED.  The SARS-CoV-2 RNA is generally detectable in upper and lower respiratory specimens during the acute phase of infection. The lowest concentration of SARS-CoV-2 viral copies this assay can detect is 250 copies / mL. A negative result does not  preclude SARS-CoV-2 infection and should not be used as the sole basis for treatment or other patient management decisions.  A negative result may occur with improper specimen collection / handling, submission of specimen other than nasopharyngeal swab, presence of viral mutation(s) within the areas targeted by this assay, and inadequate number of viral copies (<250 copies / mL). A  negative result must be combined with clinical observations, patient history, and epidemiological information.  Fact Sheet for Patients:   BoilerBrush.com.cy  Fact Sheet for Healthcare Providers: https://pope.com/  This test is not yet approved or  cleared by the [place] FDA and has been authorized for detection and/or diagnosis of SARS-CoV-2 by FDA under an Emergency Use Authorization (EUA).  This EUA will remain in effect (meaning this test can be used) for the duration of the COVID-19 declaration under Section 564(b)(1) of the Act, 21 U.S.C. section 360bbb-3(b)(1), unless the authorization is terminated or revoked sooner.  Performed at [facility], [address] [place], [place] [address]      Labs: BNP (last 3 results) No results for input(s): BNP in the last 8760 hours. Basic Metabolic Panel: Recent Labs  Lab 01/16/20 0942 01/17/20 0506  NA 130* 133*  K 3.8 3.9  CL 100 108  CO2 23 19*  GLUCOSE 167* 137*  BUN 30* 26*  CREATININE 1.91* 1.64*  CALCIUM 8.5* 7.9*   Liver Function Tests: Recent Labs  Lab 01/16/20 0942  AST 21  ALT 17  ALKPHOS 84  BILITOT 1.0  PROT 7.6  ALBUMIN 3.1*   No results for input(s): LIPASE, AMYLASE in the last 168 hours. No results for input(s): AMMONIA in the last 168 hours. CBC: Recent Labs  Lab 01/16/20 0942 01/17/20 0506  WBC 12.2* 8.9  HGB 11.1* 10.2*  HCT 34.6* 31.4*  MCV 84.4 83.3  PLT 181 162   Cardiac Enzymes: No results for input(s): CKTOTAL, CKMB, CKMBINDEX, TROPONINI  in the last 168 hours. BNP: Invalid input(s): POCBNP CBG: Recent Labs  Lab 01/18/20 0759 01/18/20 1142 01/18/20 1641 01/18/20 2122 01/19/20 0757  GLUCAP 127* 139* 132* 142* 100*   D-Dimer No results for input(s): DDIMER in the last 72 hours. Hgb A1c No results for input(s): HGBA1C in the last 72 hours. Lipid Profile No results for input(s): CHOL, HDL, LDLCALC, TRIG, CHOLHDL, LDLDIRECT in the last 72 hours. Thyroid function studies No results for input(s): TSH, T4TOTAL, T3FREE, THYROIDAB in the last 72 hours.  Invalid input(s): FREET3 Anemia work up No results for input(s): VITAMINB12, FOLATE, FERRITIN, TIBC, IRON, RETICCTPCT in the last 72 hours. Urinalysis    Component Value Date/Time   COLORURINE YELLOW (A) 01/16/2020 1300   APPEARANCEUR CLOUDY (A) 01/16/2020 1300   LABSPEC 1.010 01/16/2020 1300   PHURINE 5.0 01/16/2020 1300   GLUCOSEU NEGATIVE 01/16/2020 1300   HGBUR MODERATE (A) 01/16/2020 1300   BILIRUBINUR NEGATIVE 01/16/2020 1300   KETONESUR NEGATIVE 01/16/2020 1300   PROTEINUR NEGATIVE 01/16/2020 1300   NITRITE NEGATIVE 01/16/2020 1300   LEUKOCYTESUR LARGE (A) 01/16/2020 1300   Sepsis Labs Invalid input(s): PROCALCITONIN,  WBC,  LACTICIDVEN Microbiology Recent Results (from the past 240 hour(s))  Urine culture     Status: Abnormal   Collection Time: 01/16/20  1:00 PM   Specimen: Urine, Random  Result Value Ref Range Status   Specimen Description   Final    URINE, RANDOM Performed at [facility], [address] [place], [place] [address]    Special Requests   Final    NONE Performed at [facility], [address], [place], [place] [address]    Culture (A)  Final    <10,000 COLONIES/mL INSIGNIFICANT GROWTH Performed at [facility], [address], [place], [place] [address]    Report Status 01/17/2020 FINAL  Final  Culture, blood (routine x 2)     Status: None (  Preliminary result)   Collection Time: 01/16/20  4:13 PM    Specimen: BLOOD  Result Value Ref Range Status   Specimen Description BLOOD RAC  Final   Special Requests   Final    BOTTLES DRAWN AEROBIC AND ANAEROBIC Blood Culture adequate volume   Culture   Final    NO GROWTH 3 DAYS Performed at [facility], [address] [place], [place] [address]    Report Status PENDING  Incomplete  Culture, blood (routine x 2)     Status: None (Preliminary result)   Collection Time: 01/16/20  4:16 PM   Specimen: BLOOD  Result Value Ref Range Status   Specimen Description BLOOD BLRA  Final   Special Requests   Final    BOTTLES DRAWN AEROBIC AND ANAEROBIC Blood Culture adequate volume   Culture   Final    NO GROWTH 3 DAYS Performed at [facility], [address], [place], [place] [address]    Report Status PENDING  Incomplete  SARS Coronavirus 2 by RT PCR (hospital order, performed in [facility] hospital lab) Nasopharyngeal Nasopharyngeal Swab     Status: None   Collection Time: 01/16/20  4:30 PM   Specimen: Nasopharyngeal Swab  Result Value Ref Range Status   SARS Coronavirus 2 NEGATIVE NEGATIVE Final    Comment: (NOTE) SARS-CoV-2 target nucleic acids are NOT DETECTED.  The SARS-CoV-2 RNA is generally detectable in upper and lower respiratory specimens during the acute phase of infection. The lowest concentration of SARS-CoV-2 viral copies this assay can detect is 250 copies / mL. A negative result does not preclude SARS-CoV-2 infection and should not be used as the sole basis for treatment or other patient management decisions.  A negative result may occur with improper specimen collection / handling, submission of specimen other than nasopharyngeal swab, presence of viral mutation(s) within the areas targeted by this assay, and inadequate number of viral copies (<250 copies / mL). A negative result must be combined with clinical observations, patient history, and epidemiological information.  Fact Sheet for Patients:    BoilerBrush.com.cy  Fact Sheet for Healthcare Providers: https://pope.com/  This test is not yet approved or  cleared by the [place] FDA and has been authorized for detection and/or diagnosis of SARS-CoV-2 by FDA under an Emergency Use Authorization (EUA).  This EUA will remain in effect (meaning this test can be used) for the duration of the COVID-19 declaration under Section 564(b)(1) of the Act, 21 U.S.C. section 360bbb-3(b)(1), unless the authorization is terminated or revoked sooner.  Performed at [facility], [address] [place], [place] [address]   SARS Coronavirus 2 by RT PCR (hospital order, performed in [facility] hospital lab) Nasopharyngeal Nasopharyngeal Swab     Status: None   Collection Time: 01/18/20  1:24 PM   Specimen: Nasopharyngeal Swab  Result Value Ref Range Status   SARS Coronavirus 2 NEGATIVE NEGATIVE Final    Comment: (NOTE) SARS-CoV-2 target nucleic acids are NOT DETECTED.  The SARS-CoV-2 RNA is generally detectable in upper and lower respiratory specimens during the acute phase of infection. The lowest concentration of SARS-CoV-2 viral copies this assay can detect is 250 copies / mL. A negative result does not preclude SARS-CoV-2 infection and should not be used as the sole basis for treatment or other patient management decisions.  A negative result may occur with improper specimen collection / handling, submission of specimen other than nasopharyngeal swab, presence of viral mutation(s) within the areas targeted by  this assay, and inadequate number of viral copies (<250 copies / mL). A negative result must be combined with clinical observations, patient history, and epidemiological information.  Fact Sheet for Patients:   BoilerBrush.com.cyhttps://www.fda.gov/media/136312/download  Fact Sheet for Healthcare Providers: https://pope.com/https://www.fda.gov/media/136313/download  This test is not yet approved or   cleared by the [place] FDA and has been authorized for detection and/or diagnosis of SARS-CoV-2 by FDA under an Emergency Use Authorization (EUA).  This EUA will remain in effect (meaning this test can be used) for the duration of the COVID-19 declaration under Section 564(b)(1) of the Act, 21 U.S.C. section 360bbb-3(b)(1), unless the authorization is terminated or revoked sooner.  Performed at [facility], [address] [place], [place] [id number]      Time coordinating discharge: Over 30 minutes  SIGNED:   Alvira PhilipsEric J [place], DO  [facility] 01/19/2020, 11:24 AM

## 2020-01-20 DIAGNOSIS — N183 Chronic kidney disease, stage 3 unspecified: Secondary | ICD-10-CM | POA: Insufficient documentation

## 2020-01-20 DIAGNOSIS — N1832 Chronic kidney disease, stage 3b: Secondary | ICD-10-CM | POA: Insufficient documentation

## 2020-01-21 LAB — CULTURE, BLOOD (ROUTINE X 2)
Culture: NO GROWTH
Culture: NO GROWTH
Special Requests: ADEQUATE
Special Requests: ADEQUATE

## 2020-01-31 ENCOUNTER — Ambulatory Visit: Payer: Self-pay | Admitting: Urology

## 2020-02-10 DIAGNOSIS — N3000 Acute cystitis without hematuria: Secondary | ICD-10-CM

## 2020-02-23 ENCOUNTER — Other Ambulatory Visit: Payer: Self-pay | Admitting: Urology

## 2020-02-24 ENCOUNTER — Ambulatory Visit (INDEPENDENT_AMBULATORY_CARE_PROVIDER_SITE_OTHER): Payer: Medicare Other | Admitting: Urology

## 2020-02-24 ENCOUNTER — Other Ambulatory Visit: Payer: Self-pay

## 2020-02-24 ENCOUNTER — Encounter: Payer: Self-pay | Admitting: Urology

## 2020-02-24 VITALS — BP 95/63 | HR 102 | Ht 66.0 in | Wt 238.0 lb

## 2020-02-24 DIAGNOSIS — N3000 Acute cystitis without hematuria: Secondary | ICD-10-CM | POA: Diagnosis not present

## 2020-02-24 DIAGNOSIS — N319 Neuromuscular dysfunction of bladder, unspecified: Secondary | ICD-10-CM

## 2020-02-24 LAB — BLADDER SCAN AMB NON-IMAGING

## 2020-02-24 MED ORDER — TROSPIUM CHLORIDE ER 60 MG PO CP24: 60.0000 mg | ORAL_CAPSULE | Freq: Every day | ORAL | 11 refills | Status: DC

## 2020-02-24 NOTE — Progress Notes (Signed)
   02/24/2020 5:52 PM   [name] 1955/12/26 [id number]  Reason for visit: Follow up neurogenic bladder, UTI  HPI: I saw [name] and his wife in urology clinic for evaluation of neurogenic bladder and UTI.  He is a very poor historian and the majority history is obtained from his wife.  He apparently has had a neurogenic bladder since the early 2000's when he suffered a traumatic brain injury, and his bladder has been managed by clean intermittent catheterization ~4 times per day since then.  There are no outside urology records to review, but he reportedly was followed by urologist at [facility] in the early 2000's.  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  He was recently admitted for possible UTI in mid July when he was seen by our [facility], however urine culture ultimately showed no growth.  It sounds like the primary issue today is occasional incontinence in between catheterizations.  He has 1-3 UTIs per year regularly.  He is catheterizing 3-4 times per day at this time, and will have some leakage in between catheterizations.  They are reportedly only able to catheterize with a 12-14 French catheter secondary to his allegedly stricture disease.  Unfortunately, the history is very difficult to obtain from the patient and his wife, and there are no outside records available.  It sounds like he has a neurogenic bladder for at least 20 years when he had a traumatic brain injury, and bladder has been managed with intermittent  Catheterization.  We discussed possible options for improvement of his leakage in between catheterizations including more frequent catheterizations, and anticholinergic.  With his baseline confusion and frailty, I would avoid an anticholinergic that crosses the blood-brain barrier.  We could also consider urodynamics in the future for better evaluation of his bladder, but I  was very frank with them that I do not think after 20 years of catheterizations he will recover bladder function to resume voiding spontaneously again.  I also recommended a renal ultrasound for better evaluation of his kidneys and bladder with his long-term catheterizations.  A CT with IV contrast from January 2021 at [facility] reportedly showed no hydronephrosis or urolithiasis, and a thick walled bladder.  Trial of trospium for incontinence between catheterizations RTC 6 months with renal ultrasound prior If worsening incontinence, consider urodynamics first bladder Botox  I spent 50 total minutes on the day of the encounter including pre-visit review of the medical record, face-to-face time with the patient, and post visit ordering of labs/imaging/tests.   Sondra Come, MD  [facility] [address], [address] [place], [place] [address] [phone]

## 2020-03-09 ENCOUNTER — Other Ambulatory Visit: Payer: Self-pay | Admitting: Psychiatry

## 2020-03-09 DIAGNOSIS — F3178 Bipolar disorder, in full remission, most recent episode mixed: Secondary | ICD-10-CM

## 2020-03-16 ENCOUNTER — Telehealth: Payer: Self-pay

## 2020-03-16 DIAGNOSIS — N319 Neuromuscular dysfunction of bladder, unspecified: Secondary | ICD-10-CM

## 2020-03-16 NOTE — Telephone Encounter (Signed)
Called pt's insurance to initiate prior authorization, they will not cover trospium until patient has tried and failed Oxybutynin, myrbetriq, tolterodine, or solifenacin. Please advise.

## 2020-03-16 NOTE — Telephone Encounter (Signed)
Patient called stating that his insurance will not cover Trospium. Can another medication be sent to his pharmacy to try?

## 2020-03-16 NOTE — Telephone Encounter (Signed)
He cannot try those other anticholinergics with his dementia and high fall risk.  Okay to try Myrbetriq 50 mg daily first  Legrand Rams, MD 03/16/2020

## 2020-03-17 MED ORDER — MIRABEGRON ER 50 MG PO TB24: 50.0000 mg | ORAL_TABLET | Freq: Every day | ORAL | 11 refills | Status: DC

## 2020-03-17 NOTE — Telephone Encounter (Signed)
Called pt no answer, unable to LM as no DPR is on file. RX sent to pharmacy.

## 2020-03-18 ENCOUNTER — Inpatient Hospital Stay
Admission: EM | Admit: 2020-03-18 | Discharge: 2020-03-22 | DRG: 871 | Disposition: A | Discharge status: Home / Self Care | Payer: Medicare Other | Attending: Internal Medicine | Admitting: Internal Medicine

## 2020-03-18 ENCOUNTER — Emergency Department: Payer: Medicare Other

## 2020-03-18 ENCOUNTER — Encounter: Payer: Self-pay | Admitting: Internal Medicine

## 2020-03-18 ENCOUNTER — Other Ambulatory Visit: Payer: Self-pay

## 2020-03-18 ENCOUNTER — Inpatient Hospital Stay: Payer: Medicare Other

## 2020-03-18 DIAGNOSIS — Z7984 Long term (current) use of oral hypoglycemic drugs: Secondary | ICD-10-CM

## 2020-03-18 DIAGNOSIS — R531 Weakness: Secondary | ICD-10-CM | POA: Diagnosis present

## 2020-03-18 DIAGNOSIS — L89154 Pressure ulcer of sacral region, stage 4: Secondary | ICD-10-CM | POA: Diagnosis present

## 2020-03-18 DIAGNOSIS — R6521 Severe sepsis with septic shock: Secondary | ICD-10-CM | POA: Diagnosis present

## 2020-03-18 DIAGNOSIS — I8290 Acute embolism and thrombosis of unspecified vein: Secondary | ICD-10-CM

## 2020-03-18 DIAGNOSIS — N3000 Acute cystitis without hematuria: Secondary | ICD-10-CM | POA: Diagnosis present

## 2020-03-18 DIAGNOSIS — L03115 Cellulitis of right lower limb: Secondary | ICD-10-CM | POA: Diagnosis present

## 2020-03-18 DIAGNOSIS — R339 Retention of urine, unspecified: Secondary | ICD-10-CM | POA: Diagnosis not present

## 2020-03-18 DIAGNOSIS — Z79899 Other long term (current) drug therapy: Secondary | ICD-10-CM | POA: Diagnosis not present

## 2020-03-18 DIAGNOSIS — E1122 Type 2 diabetes mellitus with diabetic chronic kidney disease: Secondary | ICD-10-CM | POA: Diagnosis present

## 2020-03-18 DIAGNOSIS — W182XXA Fall in (into) shower or empty bathtub, initial encounter: Secondary | ICD-10-CM | POA: Diagnosis present

## 2020-03-18 DIAGNOSIS — I129 Hypertensive chronic kidney disease with stage 1 through stage 4 chronic kidney disease, or unspecified chronic kidney disease: Secondary | ICD-10-CM | POA: Diagnosis present

## 2020-03-18 DIAGNOSIS — Z6841 Body Mass Index (BMI) 40.0 and over, adult: Secondary | ICD-10-CM | POA: Diagnosis not present

## 2020-03-18 DIAGNOSIS — F319 Bipolar disorder, unspecified: Secondary | ICD-10-CM | POA: Diagnosis present

## 2020-03-18 DIAGNOSIS — Y92009 Unspecified place in unspecified non-institutional (private) residence as the place of occurrence of the external cause: Secondary | ICD-10-CM

## 2020-03-18 DIAGNOSIS — S31000D Unspecified open wound of lower back and pelvis without penetration into retroperitoneum, subsequent encounter: Secondary | ICD-10-CM | POA: Diagnosis not present

## 2020-03-18 DIAGNOSIS — R338 Other retention of urine: Secondary | ICD-10-CM | POA: Diagnosis not present

## 2020-03-18 DIAGNOSIS — Y93E1 Activity, personal bathing and showering: Secondary | ICD-10-CM

## 2020-03-18 DIAGNOSIS — N390 Urinary tract infection, site not specified: Secondary | ICD-10-CM

## 2020-03-18 DIAGNOSIS — S31000A Unspecified open wound of lower back and pelvis without penetration into retroperitoneum, initial encounter: Secondary | ICD-10-CM

## 2020-03-18 DIAGNOSIS — A419 Sepsis, unspecified organism: Principal | ICD-10-CM

## 2020-03-18 DIAGNOSIS — E1129 Type 2 diabetes mellitus with other diabetic kidney complication: Secondary | ICD-10-CM | POA: Diagnosis present

## 2020-03-18 DIAGNOSIS — Z86718 Personal history of other venous thrombosis and embolism: Secondary | ICD-10-CM | POA: Diagnosis not present

## 2020-03-18 DIAGNOSIS — L0390 Cellulitis, unspecified: Secondary | ICD-10-CM | POA: Diagnosis present

## 2020-03-18 DIAGNOSIS — N179 Acute kidney failure, unspecified: Secondary | ICD-10-CM | POA: Diagnosis present

## 2020-03-18 DIAGNOSIS — N35919 Unspecified urethral stricture, male, unspecified site: Secondary | ICD-10-CM | POA: Diagnosis present

## 2020-03-18 DIAGNOSIS — N1831 Chronic kidney disease, stage 3a: Secondary | ICD-10-CM | POA: Diagnosis present

## 2020-03-18 DIAGNOSIS — W19XXXA Unspecified fall, initial encounter: Secondary | ICD-10-CM

## 2020-03-18 DIAGNOSIS — Z933 Colostomy status: Secondary | ICD-10-CM | POA: Diagnosis not present

## 2020-03-18 DIAGNOSIS — Z20822 Contact with and (suspected) exposure to covid-19: Secondary | ICD-10-CM | POA: Diagnosis present

## 2020-03-18 DIAGNOSIS — F3170 Bipolar disorder, currently in remission, most recent episode unspecified: Secondary | ICD-10-CM

## 2020-03-18 DIAGNOSIS — T8383XA Hemorrhage of genitourinary prosthetic devices, implants and grafts, initial encounter: Secondary | ICD-10-CM

## 2020-03-18 DIAGNOSIS — Z7901 Long term (current) use of anticoagulants: Secondary | ICD-10-CM

## 2020-03-18 DIAGNOSIS — F09 Unspecified mental disorder due to known physiological condition: Secondary | ICD-10-CM

## 2020-03-18 DIAGNOSIS — Z818 Family history of other mental and behavioral disorders: Secondary | ICD-10-CM

## 2020-03-18 DIAGNOSIS — I82409 Acute embolism and thrombosis of unspecified deep veins of unspecified lower extremity: Secondary | ICD-10-CM

## 2020-03-18 DIAGNOSIS — G3189 Other specified degenerative diseases of nervous system: Secondary | ICD-10-CM

## 2020-03-18 DIAGNOSIS — S069X9S Unspecified intracranial injury with loss of consciousness of unspecified duration, sequela: Secondary | ICD-10-CM

## 2020-03-18 DIAGNOSIS — N35912 Unspecified bulbous urethral stricture, male: Secondary | ICD-10-CM | POA: Diagnosis not present

## 2020-03-18 DIAGNOSIS — Z23 Encounter for immunization: Secondary | ICD-10-CM | POA: Diagnosis present

## 2020-03-18 HISTORY — DX: Unspecified intracranial injury with loss of consciousness status unknown, initial encounter: S06.9XAA

## 2020-03-18 HISTORY — DX: Unspecified intracranial injury with loss of consciousness of unspecified duration, initial encounter: S06.9X9A

## 2020-03-18 LAB — GLUCOSE, CAPILLARY
Glucose-Capillary: 95 mg/dL (ref 70–99)
Glucose-Capillary: 86 mg/dL (ref 70–99)

## 2020-03-18 LAB — COMPREHENSIVE METABOLIC PANEL
ALT: 19 U/L (ref 0–44)
AST: 29 U/L (ref 15–41)
Albumin: 3.1 g/dL — ABNORMAL LOW (ref 3.5–5.0)
Alkaline Phosphatase: 87 U/L (ref 38–126)
Anion gap: 12 (ref 5–15)
BUN: 46 mg/dL — ABNORMAL HIGH (ref 8–23)
CO2: 18 mmol/L — ABNORMAL LOW (ref 22–32)
Calcium: 8.2 mg/dL — ABNORMAL LOW (ref 8.9–10.3)
Chloride: 97 mmol/L — ABNORMAL LOW (ref 98–111)
Creatinine, Ser: 2.06 mg/dL — ABNORMAL HIGH (ref 0.61–1.24)
GFR calc Af Amer: 38 mL/min — ABNORMAL LOW (ref >60)
GFR calc non Af Amer: 33 mL/min — ABNORMAL LOW (ref >60)
Glucose, Bld: 118 mg/dL — ABNORMAL HIGH (ref 70–99)
Potassium: 4 mmol/L (ref 3.5–5.1)
Sodium: 127 mmol/L — ABNORMAL LOW (ref 135–145)
Total Bilirubin: 0.8 mg/dL (ref 0.3–1.2)
Total Protein: 7.3 g/dL (ref 6.5–8.1)

## 2020-03-18 LAB — SARS CORONAVIRUS 2 BY RT PCR (HOSPITAL ORDER, PERFORMED IN ~~LOC~~ HOSPITAL LAB): SARS Coronavirus 2: NEGATIVE

## 2020-03-18 LAB — CBC WITH DIFFERENTIAL/PLATELET
Abs Immature Granulocytes: 0.12 10*3/uL — ABNORMAL HIGH (ref 0.00–0.07)
Basophils Absolute: 0 10*3/uL (ref 0.0–0.1)
Basophils Relative: 0 %
Eosinophils Absolute: 0.2 10*3/uL (ref 0.0–0.5)
Eosinophils Relative: 1 %
HCT: 32.1 % — ABNORMAL LOW (ref 39.0–52.0)
Hemoglobin: 10.7 g/dL — ABNORMAL LOW (ref 13.0–17.0)
Immature Granulocytes: 1 %
Lymphocytes Relative: 6 %
Lymphs Abs: 0.7 10*3/uL (ref 0.7–4.0)
MCH: 28.1 pg (ref 26.0–34.0)
MCHC: 33.3 g/dL (ref 30.0–36.0)
MCV: 84.3 fL (ref 80.0–100.0)
Monocytes Absolute: 0.6 10*3/uL (ref 0.1–1.0)
Monocytes Relative: 5 %
Neutro Abs: 11 10*3/uL — ABNORMAL HIGH (ref 1.7–7.7)
Neutrophils Relative %: 87 %
Platelets: 130 10*3/uL — ABNORMAL LOW (ref 150–400)
RBC: 3.81 MIL/uL — ABNORMAL LOW (ref 4.22–5.81)
RDW: 18.6 % — ABNORMAL HIGH (ref 11.5–15.5)
WBC: 12.6 10*3/uL — ABNORMAL HIGH (ref 4.0–10.5)
nRBC: 0 % (ref 0.0–0.2)

## 2020-03-18 LAB — URINALYSIS, COMPLETE (UACMP) WITH MICROSCOPIC
Bilirubin Urine: NEGATIVE
Glucose, UA: NEGATIVE mg/dL
Ketones, ur: NEGATIVE mg/dL
Nitrite: NEGATIVE
Protein, ur: 30 mg/dL — AB
Specific Gravity, Urine: 1.013 (ref 1.005–1.030)
Squamous Epithelial / HPF: NONE SEEN (ref 0–5)
WBC, UA: >50 WBC/hpf — ABNORMAL HIGH (ref 0–5)
pH: 9 — ABNORMAL HIGH (ref 5.0–8.0)

## 2020-03-18 LAB — LACTIC ACID, PLASMA
Lactic Acid, Venous: 4.6 mmol/L (ref 0.5–1.9)
Lactic Acid, Venous: 1.5 mmol/L (ref 0.5–1.9)

## 2020-03-18 LAB — APTT: aPTT: 41 seconds — ABNORMAL HIGH (ref 24–36)

## 2020-03-18 LAB — HEMOGLOBIN AND HEMATOCRIT, BLOOD
HCT: 28 % — ABNORMAL LOW (ref 39.0–52.0)
Hemoglobin: 9 g/dL — ABNORMAL LOW (ref 13.0–17.0)

## 2020-03-18 LAB — TROPONIN I (HIGH SENSITIVITY)
Troponin I (High Sensitivity): 18 ng/L — ABNORMAL HIGH (ref <18)
Troponin I (High Sensitivity): 20 ng/L — ABNORMAL HIGH (ref <18)

## 2020-03-18 LAB — PROTIME-INR
INR: 1.3 — ABNORMAL HIGH (ref 0.8–1.2)
Prothrombin Time: 15.2 seconds (ref 11.4–15.2)

## 2020-03-18 LAB — BRAIN NATRIURETIC PEPTIDE: B Natriuretic Peptide: 40.5 pg/mL (ref 0.0–100.0)

## 2020-03-18 MED ORDER — VANCOMYCIN HCL IN DEXTROSE 1-5 GM/200ML-% IV SOLN: 1000.0000 mg | Freq: Once | INTRAVENOUS | Status: DC

## 2020-03-18 MED ORDER — INSULIN ASPART 100 UNIT/ML ~~LOC~~ SOLN
0.0000 [IU] | Freq: Three times a day (TID) | SUBCUTANEOUS | Status: DC
  Administered 2020-03-19 (×2): 7 [IU] via SUBCUTANEOUS
  Administered 2020-03-19: 4 [IU] via SUBCUTANEOUS
  Administered 2020-03-21 (×2): 3 [IU] via SUBCUTANEOUS
  Administered 2020-03-22: 4 [IU] via SUBCUTANEOUS
  Filled 2020-03-18 (×6): qty 1

## 2020-03-18 MED ORDER — MIRABEGRON ER 50 MG PO TB24
50.0000 mg | ORAL_TABLET | Freq: Every day | ORAL | Status: DC
  Administered 2020-03-20 – 2020-03-22 (×3): 50 mg via ORAL
  Filled 2020-03-18 (×3): qty 1

## 2020-03-18 MED ORDER — ONDANSETRON HCL 4 MG/2ML IJ SOLN: 4.0000 mg | Freq: Four times a day (QID) | INTRAMUSCULAR | Status: DC | PRN

## 2020-03-18 MED ORDER — NOREPINEPHRINE 4 MG/250ML-% IV SOLN
2.0000 ug/min | INTRAVENOUS | Status: DC
  Administered 2020-03-19: 9 ug/min via INTRAVENOUS
  Administered 2020-03-19: 14 ug/min via INTRAVENOUS
  Filled 2020-03-18 (×3): qty 250

## 2020-03-18 MED ORDER — SODIUM CHLORIDE 0.9 % IV BOLUS (SEPSIS)
1000.0000 mL | Freq: Once | INTRAVENOUS | Status: AC
  Administered 2020-03-18: 1000 mL via INTRAVENOUS

## 2020-03-18 MED ORDER — QUETIAPINE FUMARATE 25 MG PO TABS
100.0000 mg | ORAL_TABLET | Freq: Every day | ORAL | Status: DC
  Administered 2020-03-18 – 2020-03-21 (×4): 100 mg via ORAL
  Filled 2020-03-18 (×4): qty 4

## 2020-03-18 MED ORDER — SODIUM CHLORIDE 0.9 % IV SOLN: INTRAVENOUS | Status: DC

## 2020-03-18 MED ORDER — ONDANSETRON HCL 4 MG PO TABS: 4.0000 mg | ORAL_TABLET | Freq: Four times a day (QID) | ORAL | Status: DC | PRN

## 2020-03-18 MED ORDER — METRONIDAZOLE IN NACL 5-0.79 MG/ML-% IV SOLN
500.0000 mg | Freq: Once | INTRAVENOUS | Status: AC
  Administered 2020-03-18: 500 mg via INTRAVENOUS
  Filled 2020-03-18: qty 100

## 2020-03-18 MED ORDER — ROSUVASTATIN CALCIUM 10 MG PO TABS
20.0000 mg | ORAL_TABLET | Freq: Every day | ORAL | Status: DC
  Administered 2020-03-20 – 2020-03-22 (×3): 20 mg via ORAL
  Filled 2020-03-18: qty 4
  Filled 2020-03-18: qty 2
  Filled 2020-03-18: qty 1

## 2020-03-18 MED ORDER — OXCARBAZEPINE 300 MG PO TABS
300.0000 mg | ORAL_TABLET | Freq: Three times a day (TID) | ORAL | Status: DC
  Administered 2020-03-18 – 2020-03-22 (×12): 300 mg via ORAL
  Filled 2020-03-18 (×14): qty 1

## 2020-03-18 MED ORDER — METRONIDAZOLE IN NACL 5-0.79 MG/ML-% IV SOLN
500.0000 mg | Freq: Three times a day (TID) | INTRAVENOUS | Status: DC
  Administered 2020-03-18 – 2020-03-20 (×5): 500 mg via INTRAVENOUS
  Filled 2020-03-18 (×5): qty 100

## 2020-03-18 MED ORDER — LACTATED RINGERS IV BOLUS
1000.0000 mL | Freq: Once | INTRAVENOUS | Status: AC
  Administered 2020-03-18: 1000 mL via INTRAVENOUS

## 2020-03-18 MED ORDER — VANCOMYCIN HCL IN DEXTROSE 1-5 GM/200ML-% IV SOLN
1000.0000 mg | Freq: Once | INTRAVENOUS | Status: DC
  Administered 2020-03-18: 1000 mg via INTRAVENOUS
  Filled 2020-03-18: qty 200

## 2020-03-18 MED ORDER — SODIUM CHLORIDE 0.9 % IV SOLN
2.0000 g | Freq: Two times a day (BID) | INTRAVENOUS | Status: DC
  Administered 2020-03-19 – 2020-03-20 (×3): 2 g via INTRAVENOUS
  Filled 2020-03-18 (×3): qty 2

## 2020-03-18 MED ORDER — NOREPINEPHRINE 4 MG/250ML-% IV SOLN
INTRAVENOUS | Status: AC
  Administered 2020-03-18: 2 ug/min via INTRAVENOUS
  Filled 2020-03-18: qty 250

## 2020-03-18 MED ORDER — APIXABAN 5 MG PO TABS
5.0000 mg | ORAL_TABLET | Freq: Two times a day (BID) | ORAL | Status: DC
  Administered 2020-03-18: 5 mg via ORAL
  Filled 2020-03-18: qty 1

## 2020-03-18 MED ORDER — SODIUM CHLORIDE 0.9 % IV SOLN
2.0000 g | Freq: Once | INTRAVENOUS | Status: AC
  Administered 2020-03-18: 2 g via INTRAVENOUS
  Filled 2020-03-18: qty 2

## 2020-03-18 MED ORDER — SODIUM CHLORIDE 0.9 % IV SOLN: 2.0000 g | Freq: Once | INTRAVENOUS | Status: DC

## 2020-03-18 MED ORDER — VANCOMYCIN HCL 2000 MG/400ML IV SOLN
2000.0000 mg | Freq: Once | INTRAVENOUS | Status: DC
  Filled 2020-03-18: qty 400

## 2020-03-18 MED ORDER — QUETIAPINE FUMARATE 25 MG PO TABS
50.0000 mg | ORAL_TABLET | Freq: Two times a day (BID) | ORAL | Status: DC | PRN
  Administered 2020-03-18: 50 mg via ORAL
  Filled 2020-03-18: qty 2

## 2020-03-18 MED ORDER — LACTATED RINGERS IV SOLN: INTRAVENOUS | Status: AC

## 2020-03-18 MED ORDER — VANCOMYCIN HCL 1250 MG/250ML IV SOLN
1250.0000 mg | INTRAVENOUS | Status: AC
  Administered 2020-03-19 – 2020-03-20 (×2): 1250 mg via INTRAVENOUS
  Filled 2020-03-18 (×3): qty 250

## 2020-03-18 MED ORDER — VANCOMYCIN HCL IN DEXTROSE 1-5 GM/200ML-% IV SOLN
1000.0000 mg | Freq: Once | INTRAVENOUS | Status: AC
  Administered 2020-03-18: 1000 mg via INTRAVENOUS
  Filled 2020-03-18: qty 200

## 2020-03-18 MED ORDER — ACETAMINOPHEN 325 MG PO TABS: 650.0000 mg | ORAL_TABLET | Freq: Four times a day (QID) | ORAL | Status: DC | PRN

## 2020-03-18 MED ORDER — ACETAMINOPHEN 650 MG RE SUPP: 650.0000 mg | Freq: Four times a day (QID) | RECTAL | Status: DC | PRN

## 2020-03-18 NOTE — ED Triage Notes (Addendum)
Pt arrives via ems from home, pt was in the shower this am and fell due to weakness, pt reports that he hit the floor, nothing sharp, pt has redness and swelling noted to the right lower extremity, pt unable to give an exact date of the changes in the leg, pt also has a wound that was healing to the sacral area that ruptured open with the fall, pt has home health come into assist with the wound dressings  Pt reports covid vaccine received in February 2021

## 2020-03-18 NOTE — ED Notes (Signed)
Pt is visibley tremulous and slightly diaphoretic. Pt denies regular alcohol consumption, reports he ate dinner.

## 2020-03-18 NOTE — ED Notes (Signed)
Mepilex applied to sacral wound. Pt changed into gown and placed in hospital bed in admission waiting rm 31.

## 2020-03-18 NOTE — ED Notes (Signed)
Webb Silversmith here to see pt.

## 2020-03-18 NOTE — ED Notes (Signed)
Pt is alert, oriented x4, NAD noted. Pt seems to be repetitive with questions. Sacral wound/tear noted, unsure how deep wound extends. No bleeding noted at this time, pt denies pain.

## 2020-03-18 NOTE — ED Notes (Signed)
Sheets changed, duoderm placed to saccrum by lexie, rn. Pt oriented to room. Water provided. Spouse at bedside. Foley draining clear yellow urine.
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
## 2020-03-18 NOTE — H&P (Addendum)
History and Physical    [name] YTK:[id number] DOB: 25-Jan-1956 DOA: 03/18/2020  PCP: [name], MD   Patient coming from: Home  I have personally briefly reviewed patient's old medical records in [place]  Chief Complaint: Status post fall  HPI: [name] is a 64 y.o. 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  He has had fever and chills and was noted to have swelling, redness and differential warmth involving the right lower extremity.  Patient has a healing wound in the sacral area ruptured when he fell and was bleeding. He denies having any chest pain, no shortness of breath, no nausea, no vomiting, no abdominal pain or any changes in his bowel habits. Upon arrival to the ER patient was noted to be hypotensive and blood pressure improved with IVF resuscitation. He received 2L of IVF in the ER. Labs show sodium 127, potassium 4, chloride 97, bicarb 18, BUN 46, creatinine 2.06, calcium 8.2, AST 29, ALT 19, total protein 7.3, lactic acid 4.6 >> 1.5, white count 12.6 with a left shift, hemoglobin 10.7, hematocrit 32.1, MCV 84.3, RDW 18.6, platelet count 130 Patient has pyuria Chest x ray reviewed by me shows no obvious infiltrates or effusion Twelve-lead EKG reviewed by me shows sinus rhythm  ED Course: Patient presents to the emergency room by EMS following a fall and was found to be hypotensive.  He met sepsis criteria by having an elevated lactic acid level, leukocytosis, right lower extremity cellulitis/UTI, hypotension and low-grade fever.  Patient received 2 L IV fluid bolus in the ER with improvement in his blood pressure.  He also received broad-spectrum  antibiotic therapy with Flagyl, vancomycin and cefepime.  Lactic acid level shows a downward trend.  He will be admitted to the hospital for further evaluation  Review of Systems: As per HPI otherwise 10 point review of systems negative.    Past Medical History:  Diagnosis Date  . Bipolar disorder ([place])   . Diabetes mellitus without complication ([place])   . History of blood clots   . Hypertension     Past Surgical History:  Procedure Laterality Date  . BACK SURGERY    . CARPAL TUNNEL RELEASE Bilateral   . COLON SURGERY    . TONSILLECTOMY       reports that he has never smoked. He has never used smokeless tobacco. He reports current alcohol use. He reports that he does not use drugs.  No Known Allergies  Family History  Problem Relation Age of Onset  . Depression Father      Prior to Admission medications   Medication Sig Start Date End Date Taking? Authorizing Provider  ELIQUIS 5 MG TABS tablet Take 5 mg by mouth 2 (two) times daily.  11/17/18   [provider]  glipiZIDE (GLUCOTROL XL) 10 MG 24 hr tablet Take 10 mg by mouth daily with breakfast.  09/26/15   [provider]  mirabegron ER (MYRBETRIQ) 50 MG TB24 tablet Take 1 tablet (50 mg total) by mouth daily. 03/17/20   Billey Co, MD  Oxcarbazepine (TRILEPTAL) 300 MG tablet Take 1 tablet (300 mg total) by mouth 3 (three) times daily. 12/29/19   Ursula Alert, MD  QUEtiapine (SEROQUEL) 100  MG tablet TAKE 1 TABLET BY MOUTH AT BEDTIME 03/09/20   Ursula Alert, MD  QUEtiapine (SEROQUEL) 50 MG tablet Take 1 tablet (50 mg total) by mouth 2 (two) times daily as needed. For severe anxiety and agitation 12/29/19   Ursula Alert, MD  rosuvastatin (CRESTOR) 20 MG tablet Take 20 mg by mouth daily.  08/29/17   [provider]    Physical Exam: Vitals:   03/18/20 1415 03/18/20 1430 03/18/20 1443 03/18/20 1500  BP:  90/65 121/74   Pulse: 84 86 86   Resp: 20  (!) 36 20  Temp:      TempSrc:      SpO2:  99% 96% 99%   Weight:      Height:         Vitals:   03/18/20 1415 03/18/20 1430 03/18/20 1443 03/18/20 1500  BP:  90/65 121/74   Pulse: 84 86 86   Resp: 20  (!) 36 20  Temp:      TempSrc:      SpO2: 99% 96% 99%   Weight:      Height:        Constitutional: NAD, alert and oriented x 3.  Acutely ill-appearing Eyes: PERRL, lids and conjunctivae normal ENMT: Mucous membranes are moist.  Neck: normal, supple, no masses, no thyromegaly Respiratory: clear to auscultation bilaterally, no wheezing, no crackles. Normal respiratory effort. No accessory muscle use.  Cardiovascular: Regular rate and rhythm, no murmurs / rubs / gallops. No extremity edema. 2+ pedal pulses. No carotid bruits.  Abdomen: no tenderness, no masses palpated. No hepatosplenomegaly. Bowel sounds positive.  Central adiposity Musculoskeletal: no clubbing / cyanosis redness involving the right lower extremity, swelling with differential warmth Skin: no rashes, lesions, ulcers.  Healing sacral decubitus ulcer Neurologic: No gross focal neurologic deficit.  Generalized weakness Psychiatric: Normal mood and affect.   Labs on Admission: I have personally reviewed following labs and imaging studies  CBC: Recent Labs  Lab 03/18/20 1219  WBC 12.6*  NEUTROABS 11.0*  HGB 10.7*  HCT 32.1*  MCV 84.3  PLT 633*   Basic Metabolic Panel: Recent Labs  Lab 03/18/20 1219  NA 127*  K 4.0  CL 97*  CO2 18*  GLUCOSE 118*  BUN 46*  CREATININE 2.06*  CALCIUM 8.2*   GFR: Estimated Creatinine Clearance: 42.8 mL/min (A) (by C-G formula based on SCr of 2.06 mg/dL (H)). Liver Function Tests: Recent Labs  Lab 03/18/20 1219  AST 29  ALT 19  ALKPHOS 87  BILITOT 0.8  PROT 7.3  ALBUMIN 3.1*   No results for input(s): LIPASE, AMYLASE in the last 168 hours. No results for input(s): AMMONIA in the last 168 hours. Coagulation Profile: Recent Labs  Lab 03/18/20 1219  INR 1.3*   Cardiac Enzymes: No results for  input(s): CKTOTAL, CKMB, CKMBINDEX, TROPONINI in the last 168 hours. BNP (last 3 results) No results for input(s): PROBNP in the last 8760 hours. HbA1C: No results for input(s): HGBA1C in the last 72 hours. CBG: No results for input(s): GLUCAP in the last 168 hours. Lipid Profile: No results for input(s): CHOL, HDL, LDLCALC, TRIG, CHOLHDL, LDLDIRECT in the last 72 hours. Thyroid Function Tests: No results for input(s): TSH, T4TOTAL, FREET4, T3FREE, THYROIDAB in the last 72 hours. Anemia Panel: No results for input(s): VITAMINB12, FOLATE, FERRITIN, TIBC, IRON, RETICCTPCT in the last 72 hours. Urine analysis:    Component Value Date/Time   COLORURINE YELLOW (A) 03/18/2020 1219   APPEARANCEUR HAZY (A) 03/18/2020 1219  LABSPEC 1.013 03/18/2020 1219   PHURINE 9.0 (H) 03/18/2020 1219   GLUCOSEU NEGATIVE 03/18/2020 1219   HGBUR SMALL (A) 03/18/2020 1219   BILIRUBINUR NEGATIVE 03/18/2020 [place] 03/18/2020 1219   PROTEINUR 30 (A) 03/18/2020 1219   NITRITE NEGATIVE 03/18/2020 1219   LEUKOCYTESUR LARGE (A) 03/18/2020 1219    Radiological Exams on Admission: DG Chest Port 1 View  Result Date: 03/18/2020 CLINICAL DATA:  Questionable sepsis. EXAM: PORTABLE CHEST 1 VIEW COMPARISON:  None. FINDINGS: The heart, hila, mediastinum are normal. No pneumothorax. Mild vascular crowding in the medial right lung base. No convincing evidence of infiltrate. IMPRESSION: No convincing evidence of pneumonia. Vascular crowding in the medial right lung base. Electronically Signed   By: Dorise Bullion III M.D   On: 03/18/2020 12:49    EKG: Independently reviewed. 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 Will place patient on empiric antibiotic therapy with vancomycin, Flagyl and cefepime Follow-up results of blood and urine culture Aggressive IV fluid resuscitation   UTI Patient has significant pyuria He has to do self caths at home and is a high risk for UTI Treat empirically with cefepime until urine culture results become available   Right lower extremity cellulitis (POA) Patient noted to have right lower extremity swelling with redness and differential warmth Elevate right lower extremity Obtain right lower extremity venous Doppler to rule out DVT   Morbid obesity (BMI 40) Complicates overall prognosis and care   Diabetes mellitus with complications of stage III chronic kidney disease Place patient on consistent carbohydrate diet Accu-Cheks before meals and at bedtime with sliding scale coverage Renal function appears stable Monitor renal function closely   History of thromboembolic disease Continue Apixaban   Bipolar disorder Continue Seroquel   Sacral wound (POA) We will request wound care consult   DVT prophylaxis: Apixaban Code Status: Full code Family Communication: Greater than 50% of time was spent discussing plan of care with patient at the bedside.  He verbalizes understanding and agrees with the plan. Disposition Plan: Back to previous home environment Consults called: None    Deniah Saia MD [facility]     03/18/2020, 4:06 PM

## 2020-03-18 NOTE — ED Notes (Signed)
Ultrasound with pt 

## 2020-03-18 NOTE — ED Notes (Signed)
Secure chat sent to Webb Silversmith, np to notify of hypotension 77/44

## 2020-03-18 NOTE — ED Notes (Signed)
Notified elizabeth ouma, np regarding pt's blood pressure, order for LR bolus received.

## 2020-03-18 NOTE — ED Notes (Addendum)
Attempted to catheterize pt without success, alternate RN attempted to catheterize pt without success. Blood return noted in foley but urine not noted. MD notified.

## 2020-03-18 NOTE — ED Provider Notes (Signed)
[facility] Emergency Department Provider Note   ____________________________________________   First MD Initiated Contact with Patient 03/18/20 1213     (approximate)  I have reviewed the triage vital signs and the nursing notes.   HISTORY  Chief Complaint Weakness and Fall    HPI [name] is a 64 y.o. male with a past medical history of type 2 diabetes and hypertension as well as a chronic gluteal cleft wound who presents after a mechanical fall from standing in a shower resulting in bleeding from his sacral wound.  Patient also notes that his right lower extremity has been erythematous and swollen for an unknown period of time.  Patient states that this fall was due to weakness that is worsening over the last 2 days.         Past Medical History:  Diagnosis Date  . Bipolar disorder (HCC)   . Diabetes mellitus without complication (HCC)   . History of blood clots   . Hypertension     Patient Active Problem List   Diagnosis Date Noted  . Sepsis (HCC) 03/18/2020  . Acute cystitis without hematuria   . Pressure injury of skin 01/17/2020  . DVT (deep venous thrombosis) (HCC) 01/16/2020  . Cellulitis of sacral region 01/16/2020  . HLD (hyperlipidemia) 01/16/2020  . Bipolar disorder, in full remission, most recent episode mixed (HCC) 12/29/2019  . High risk medication use 10/25/2019  . Noncompliance with treatment plan 10/25/2019  . Bipolar I disorder, most recent episode mixed (HCC) 01/07/2019  . GAD (generalized anxiety disorder) 01/07/2019  . Insomnia due to medical condition 01/07/2019  . DVT, recurrent, lower extremity, acute (HCC) 03/06/2016  . Bipolar disorder (HCC) 01/03/2016  . Lithium toxicity 01/02/2016  . Chronic anticoagulation 07/06/2014  . 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 Hyperlipidemia 07/06/2014  . Hypertension 07/06/2014  . Leg swelling 07/06/2014  . Obesity,  Class II, BMI 35-39.9, with comorbidity 07/06/2014  . On medication for venous thromboembolism 07/06/2014  . Sleep apnea 07/06/2014  . Type II diabetes mellitus with renal manifestations (HCC) 07/06/2014  . 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 Venous thromboembolism (VTE) 07/06/2014    Past Surgical History:  Procedure Laterality Date  . BACK SURGERY    . CARPAL TUNNEL RELEASE Bilateral   . COLON SURGERY    . TONSILLECTOMY      Prior to Admission medications   Medication Sig Start Date End Date Taking? Authorizing Provider  ELIQUIS 5 MG TABS tablet Take 5 mg by mouth 2 (two) times daily.  11/17/18   [provider]  glipiZIDE (GLUCOTROL XL) 10 MG 24 hr tablet Take 10 mg by mouth daily with breakfast.  09/26/15   [provider]  mirabegron ER (MYRBETRIQ) 50 MG TB24 tablet Take 1 tablet (50 mg total) by mouth daily. 03/17/20   Sondra Come, MD  Oxcarbazepine (TRILEPTAL) 300 MG tablet Take 1 tablet (300 mg total) by mouth 3 (three) times daily. 12/29/19   [name], MD  QUEtiapine (SEROQUEL) 100 MG tablet TAKE 1 TABLET BY MOUTH AT BEDTIME 03/09/20   [name], MD  QUEtiapine (SEROQUEL) 50 MG tablet Take 1 tablet (50 mg total) by mouth 2 (two) times daily as needed. For severe anxiety and agitation 12/29/19   [name], MD  rosuvastatin (CRESTOR) 20 MG tablet Take 20 mg by mouth daily.  08/29/17   [provider]    Allergies Patient has no known allergies.  Family History  Problem Relation Age of Onset  . Depression Father     Social History Social History   Tobacco Use  . Smoking status: Never Smoker  . Smokeless tobacco: Never Used  Vaping Use  . Vaping Use: Never used  Substance Use Topics  . Alcohol use: Yes    Comment: social  . Drug use: No    Review of Systems Constitutional: No fever/chills Eyes: No visual changes. ENT: No sore throat. Cardiovascular: Denies chest pain. Respiratory: Denies shortness of  breath. Gastrointestinal: No abdominal pain.  No nausea, no vomiting.  No diarrhea. Genitourinary: Negative for dysuria. Musculoskeletal: Negative for acute arthralgias Skin: Negative for rash. Neurological: Negative for headaches, numbness/paresthesias in any extremity Psychiatric: Negative for suicidal ideation/homicidal ideation   ____________________________________________   PHYSICAL EXAM:  VITAL SIGNS: ED Triage Vitals  Enc Vitals Group     BP 03/18/20 1217 106/63     Pulse Rate 03/18/20 1214 (!) 105     Resp 03/18/20 1214 16     Temp 03/18/20 1214 99.4 F (37.4 C)     Temp Source 03/18/20 1214 Oral     SpO2 03/18/20 1214 100 %     Weight 03/18/20 1215 250 lb (113.4 kg)     Height 03/18/20 1215 5\' 6"  (1.676 m)     Head Circumference --      Peak Flow --      Pain Score 03/18/20 1214 0     Pain Loc --      Pain Edu? --      Excl. in GC? --    Constitutional: Alert and oriented. Well appearing and in no acute distress. Eyes: Conjunctivae are normal. PERRL. EOMI. Head: Atraumatic. Nose: No congestion/rhinnorhea. Mouth/Throat: Mucous membranes are moist. Neck: No stridor Cardiovascular: Normal rate, regular rhythm. Grossly normal heart sounds.  Good peripheral circulation. Respiratory: Normal respiratory effort.  No retractions. Gastrointestinal: Soft and nontender. No distention. Back: Deep gluteal cleft wound hemostatic Musculoskeletal: No lower extremity tenderness nor edema.  No joint effusions. Neurologic:  Normal speech and language. No gross focal neurologic deficits are appreciated. Skin:  Skin is warm and dry.  Erythema or right lower extremity Psychiatric: Mood and affect are normal. Speech and behavior are normal.  ____________________________________________   LABS (all labs ordered are listed, but only abnormal results are displayed)  Labs Reviewed  LACTIC ACID, PLASMA - Abnormal; Notable for the following components:      Result Value   Lactic  Acid, Venous 4.6 (*)    All other components within normal limits  COMPREHENSIVE METABOLIC PANEL - Abnormal; Notable for the following components:   Sodium 127 (*)    Chloride 97 (*)    CO2 18 (*)    Glucose, Bld 118 (*)    BUN 46 (*)    Creatinine, Ser 2.06 (*)    Calcium 8.2 (*)    Albumin 3.1 (*)    GFR calc non Af Amer 33 (*)    GFR calc Af Amer 38 (*)    All other components within normal limits  CBC WITH DIFFERENTIAL/PLATELET - Abnormal; Notable for the following components:   WBC 12.6 (*)    RBC 3.81 (*)    Hemoglobin 10.7 (*)    HCT 32.1 (*)    RDW 18.6 (*)    Platelets 130 (*)    Neutro Abs 11.0 (*)    Abs Immature Granulocytes 0.12 (*)    All other components within normal limits  PROTIME-INR - Abnormal; Notable  for the following components:   INR 1.3 (*)    All other components within normal limits  APTT - Abnormal; Notable for the following components:   aPTT 41 (*)    All other components within normal limits  URINALYSIS, COMPLETE (UACMP) WITH MICROSCOPIC - Abnormal; Notable for the following components:   Color, Urine YELLOW (*)    APPearance HAZY (*)    pH 9.0 (*)    Hgb urine dipstick SMALL (*)    Protein, ur 30 (*)    Leukocytes,Ua LARGE (*)    WBC, UA >50 (*)    Bacteria, UA RARE (*)    All other components within normal limits  TROPONIN I (HIGH SENSITIVITY) - Abnormal; Notable for the following components:   Troponin I (High Sensitivity) 18 (*)    All other components within normal limits  TROPONIN I (HIGH SENSITIVITY) - Abnormal; Notable for the following components:   Troponin I (High Sensitivity) 20 (*)    All other components within normal limits  URINE CULTURE  CULTURE, BLOOD (ROUTINE X 2)  CULTURE, BLOOD (ROUTINE X 2)  SARS CORONAVIRUS 2 BY RT PCR (HOSPITAL ORDER, PERFORMED IN  HOSPITAL LAB)  LACTIC ACID, PLASMA  BRAIN NATRIURETIC PEPTIDE   ____________________________________________  EKG  ED ECG REPORT I, Merwyn Katos,  the attending physician, personally viewed and interpreted this ECG.  Date: 03/18/2020 EKG Time: 1208 Rate: 92 Rhythm: normal sinus rhythm QRS Axis: normal Intervals: normal ST/T Wave abnormalities: normal Narrative Interpretation: no evidence of acute ischemia  ____________________________________________  RADIOLOGY  ED MD interpretation: Portable 1 view of the chest shows no evidence of pneumonia, pneumothorax, or widened mediastinum  Official radiology report(s): DG Chest Port 1 View  Result Date: 03/18/2020 CLINICAL DATA:  Questionable sepsis. EXAM: PORTABLE CHEST 1 VIEW COMPARISON:  None. FINDINGS: The heart, hila, mediastinum are normal. No pneumothorax. Mild vascular crowding in the medial right lung base. No convincing evidence of infiltrate. IMPRESSION: No convincing evidence of pneumonia. Vascular crowding in the medial right lung base. Electronically Signed   By: Gerome Sam III M.D   On: 03/18/2020 12:49    ____________________________________________   PROCEDURES  Procedure(s) performed (including Critical Care):  .Critical Care Performed by: Merwyn Katos, MD Authorized by: Merwyn Katos, MD   Critical care provider statement:    Critical care time (minutes):  47   Critical care time was exclusive of:  Separately billable procedures and treating other patients   Critical care was necessary to treat or prevent imminent or life-threatening deterioration of the following conditions:  Sepsis   Critical care was time spent personally by me on the following activities:  Discussions with consultants, evaluation of patient's response to treatment, examination of patient, ordering and performing treatments and interventions, ordering and review of laboratory studies, ordering and review of radiographic studies, pulse oximetry, re-evaluation of patient's condition, obtaining history from patient or surrogate and review of old charts .1-3 Lead EKG  Interpretation Performed by: Merwyn Katos, MD Authorized by: Merwyn Katos, MD     Interpretation: normal     ECG rate assessment: normal     Rhythm: sinus rhythm     Ectopy: none     Conduction: normal       ____________________________________________   INITIAL IMPRESSION / ASSESSMENT AND PLAN / ED COURSE        Patient presents as a mechanical fall from standing concerning for worsening of a gluteal cleft wound.  Patient incidentally found  to have evidence of likely cellulitis on his right lower extremity.  UA shows possible urinary tract infection as well.  Patient will be empirically covered with antibiotics.  Patient had appropriate fluid bolus given his ideal body weight with little improvement in his blood pressure.  Patient was consulted for admission to the hospitalist service as well as the intensivist.      ____________________________________________   FINAL CLINICAL IMPRESSION(S) / ED DIAGNOSES  Final diagnoses:  Weakness  Fall, initial encounter  Wound of sacral region, initial encounter  Urinary tract infection without hematuria, site unspecified     ED Discharge Orders    None       Note:  This document was prepared using Dragon voice recognition software and may include unintentional dictation errors.   Merwyn KatosBradler, Leta Bucklin K, MD 03/18/20 1520

## 2020-03-18 NOTE — Progress Notes (Signed)
CODE SEPSIS - PHARMACY COMMUNICATION  **Broad Spectrum Antibiotics should be administered within 1 hour of Sepsis diagnosis**  Time Code Sepsis Called/Page Received: 1354  Antibiotics Ordered:  Cefepime 2g IV  Vancomycin 1g IV Metronidazole 500mg   Time of 1st antibiotic administration: 1401  Additional action taken by pharmacy:  Ordered additional vancomycin 1 g IV x1 for total LD 2g  If necessary, Name of Provider/Nurse Contacted: Spoke with nurse to only give a total of 2g IV vancomycin    , PharmD Pharmacy Resident  03/18/2020 1:56 PM

## 2020-03-18 NOTE — ED Notes (Signed)
Sacral wound assessed. Stage 3 with some bleeding. Duo-derm placed over wound. Pt has foley catheter in place placed by urology. Unsure of size of catheter. No urology notes found at this time.

## 2020-03-18 NOTE — Progress Notes (Signed)
Pharmacy Antibiotic Note  [name] is a 64 y.o. male admitted on 03/18/2020 with sepsis secondary to cellulitis. Patient noted to have fever and chills as well as swelling, redness, and differential warmth involving the RLE. Upon arrival to the ED, pt was hypotensive, lactic acid 4.6>>1.6, WBC 12.6 with left shift, T 99.19F, and pyuria (patient does self cath at home). Patient received a loading dose of Vancomycin 2g IV; also received Cefepime 2g and metronidazole 500mg  x1. Pharmacy has been consulted for Vancomycin and Cefepime dosing.  Doppler ordered to r/o DVT 9/18 CXR no evidence of convincing PNA  Plan: Will give Vancomycin 1250mg  IV q24 hours per dosing nomogram Will give Cefepime 2g IV q12 hours Monitor renal function and adjust doses as clinically indicated Obtain vanc level prior to 4th or 5th dose  Height: 5\' 6"  (167.6 cm) Weight: 113.4 kg (250 lb) IBW/kg (Calculated) : 63.8  Temp (24hrs), Avg:99.4 F (37.4 C), Min:99.4 F (37.4 C), Max:99.4 F (37.4 C)  Recent Labs  Lab 03/18/20 1219 03/18/20 1446  WBC 12.6*  --   CREATININE 2.06*  --   LATICACIDVEN 4.6* 1.5    Estimated Creatinine Clearance: 42.8 mL/min (A) (by C-G formula based on SCr of 2.06 mg/dL (H)).    No Known Allergies  Antimicrobials this admission: 09/18 Vancomycin >>  09/18 Cefepime >>  09/18 Metronidazole >>  Microbiology results: 9/18 BCx: pending collection 9/18 UCx: pending collection  Thank you for allowing pharmacy to be a part of this patient's care.  10/18, PharmD Pharmacy Resident  03/18/2020 4:23 PM

## 2020-03-18 NOTE — ED Notes (Signed)
Iv team here 

## 2020-03-19 DIAGNOSIS — R339 Retention of urine, unspecified: Secondary | ICD-10-CM

## 2020-03-19 DIAGNOSIS — L03115 Cellulitis of right lower limb: Secondary | ICD-10-CM

## 2020-03-19 DIAGNOSIS — R6521 Severe sepsis with septic shock: Secondary | ICD-10-CM

## 2020-03-19 DIAGNOSIS — Z86718 Personal history of other venous thrombosis and embolism: Secondary | ICD-10-CM

## 2020-03-19 DIAGNOSIS — S31000D Unspecified open wound of lower back and pelvis without penetration into retroperitoneum, subsequent encounter: Secondary | ICD-10-CM

## 2020-03-19 DIAGNOSIS — A419 Sepsis, unspecified organism: Secondary | ICD-10-CM

## 2020-03-19 DIAGNOSIS — L89154 Pressure ulcer of sacral region, stage 4: Secondary | ICD-10-CM | POA: Insufficient documentation

## 2020-03-19 DIAGNOSIS — R338 Other retention of urine: Secondary | ICD-10-CM

## 2020-03-19 DIAGNOSIS — N35912 Unspecified bulbous urethral stricture, male: Secondary | ICD-10-CM

## 2020-03-19 LAB — CBC
HCT: 26.7 % — ABNORMAL LOW (ref 39.0–52.0)
Hemoglobin: 9 g/dL — ABNORMAL LOW (ref 13.0–17.0)
MCH: 28.2 pg (ref 26.0–34.0)
MCHC: 33.7 g/dL (ref 30.0–36.0)
MCV: 83.7 fL (ref 80.0–100.0)
Platelets: 125 10*3/uL — ABNORMAL LOW (ref 150–400)
RBC: 3.19 MIL/uL — ABNORMAL LOW (ref 4.22–5.81)
RDW: 18.4 % — ABNORMAL HIGH (ref 11.5–15.5)
WBC: 11.5 10*3/uL — ABNORMAL HIGH (ref 4.0–10.5)
nRBC: 0 % (ref 0.0–0.2)

## 2020-03-19 LAB — GLUCOSE, CAPILLARY
Glucose-Capillary: 162 mg/dL — ABNORMAL HIGH (ref 70–99)
Glucose-Capillary: 202 mg/dL — ABNORMAL HIGH (ref 70–99)
Glucose-Capillary: 209 mg/dL — ABNORMAL HIGH (ref 70–99)
Glucose-Capillary: 136 mg/dL — ABNORMAL HIGH (ref 70–99)

## 2020-03-19 LAB — PROTIME-INR
INR: 1.4 — ABNORMAL HIGH (ref 0.8–1.2)
Prothrombin Time: 16.2 seconds — ABNORMAL HIGH (ref 11.4–15.2)

## 2020-03-19 LAB — BASIC METABOLIC PANEL
Anion gap: 8 (ref 5–15)
BUN: 36 mg/dL — ABNORMAL HIGH (ref 8–23)
CO2: 19 mmol/L — ABNORMAL LOW (ref 22–32)
Calcium: 7.5 mg/dL — ABNORMAL LOW (ref 8.9–10.3)
Chloride: 102 mmol/L (ref 98–111)
Creatinine, Ser: 1.78 mg/dL — ABNORMAL HIGH (ref 0.61–1.24)
GFR calc Af Amer: 46 mL/min — ABNORMAL LOW (ref >60)
GFR calc non Af Amer: 39 mL/min — ABNORMAL LOW (ref >60)
Glucose, Bld: 106 mg/dL — ABNORMAL HIGH (ref 70–99)
Potassium: 3.9 mmol/L (ref 3.5–5.1)
Sodium: 129 mmol/L — ABNORMAL LOW (ref 135–145)

## 2020-03-19 LAB — HEMOGLOBIN A1C
Hgb A1c MFr Bld: 6.3 % — ABNORMAL HIGH (ref 4.8–5.6)
Mean Plasma Glucose: 134.11 mg/dL

## 2020-03-19 LAB — CORTISOL-AM, BLOOD: Cortisol - AM: 8.2 ug/dL (ref 6.7–22.6)

## 2020-03-19 LAB — PROCALCITONIN: Procalcitonin: 24.34 ng/mL

## 2020-03-19 MED ORDER — HEPARIN SODIUM (PORCINE) 5000 UNIT/ML IJ SOLN
5000.0000 [IU] | Freq: Three times a day (TID) | INTRAMUSCULAR | Status: DC
  Administered 2020-03-19 – 2020-03-21 (×8): 5000 [IU] via SUBCUTANEOUS
  Filled 2020-03-19 (×8): qty 1

## 2020-03-19 NOTE — Assessment & Plan Note (Signed)
noted 

## 2020-03-19 NOTE — ED Notes (Signed)
Report given to Ashley RN

## 2020-03-19 NOTE — Assessment & Plan Note (Signed)
-   continue seroquel  

## 2020-03-19 NOTE — Assessment & Plan Note (Addendum)
-   continue SSI and CBG - follow up A1c = 6.3%

## 2020-03-19 NOTE — Progress Notes (Addendum)
    BRIEF OVERNIGHT PROGRESS REPORT   SUBJECTIVE: Patient remains hypotensive despite aggressive volume resuscitation.  OBJECTIVE: On arrival to the ED, he was afebrile with blood pressure 66/48 mm Hg  Recheck 79/45 mm Hg and pulse rate 81 beats/min. There were no focal neurological deficits; he was alert and oriented x4, and he did not appear to be in any acute distress.  ASSESSMENT AND PLAN:  Sepsis with septic shock secondary to RLL cellulitis, sacral decubitus and possible UTI. Chest xray reviewed and shows no evidence of pneumonia - BP unresponsive to IVF resuscitation, will start Levophed to keep MAP>65 - Continue empiric abx with vancomycin and Cefepime - Follow blood and urine cultures - Monitor fever curve - Trend white count and procalcitonin - Consider general surgery consult, wound may need debridement - PCCM consult    Webb Silversmith, BSN, MSN, DNP, CCRN,FNP-C  [facility] Nurse Practitioner  [facility]

## 2020-03-19 NOTE — Assessment & Plan Note (Addendum)
-   RLE noted with erythema and calor on admission; this is stable and slowly improving  - negative for DVT on dupulex - continue abx as noted under sepsis

## 2020-03-19 NOTE — Hospital Course (Addendum)
[name] is a 64 y.o. 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 He was unable to get up after the fall.   He has had fever and chills and was noted to have swelling, redness and warmth involving the right lower extremity.   Patient has a healing wound in the sacral area ruptured when he fell and was bleeding; he follows with outpatient wound care for this.  He underwent work-up in the ER and was found to be significantly hypotensive and received IV fluid resuscitation.  This was still refractory and he required initiation of Levophed. He was started on vancomycin, cefepime, Flagyl. Initial lactic acid was 4.6 which responded to fluids and repeat lactic was 1.5. WBC 12.6 Vitals: 99.4, HR 105, RR 16-27, BP 106/63, 100% RA.   Also on admission, he had difficulty with Foley catheter being placed by staff.  Urology was consulted and he ultimately required a sensor wire to be placed within the bladder, then catheter threaded over the wire.  He had good relief of urinary retention after this.  Catheter was recommended to remain in place for 1 to 2 weeks per urology with outpatient follow-up.

## 2020-03-19 NOTE — Progress Notes (Signed)
Subjective: [name] is tolerating the foley well and his urine is clear.  His Cr and WBC count are improving.  The urine culture is growing proteus but sensitivities are pending.  ROS:  Review of Systems  Constitutional: Negative for chills and fever.  Gastrointestinal: Negative for abdominal pain.  Genitourinary: Negative.     Anti-infectives: Anti-infectives (From admission, onward)   Start     Dose/Rate Route Frequency Ordered Stop   03/19/20 1500  vancomycin (VANCOREADY) IVPB 1250 mg/250 mL        1,250 mg 166.7 mL/hr over 90 Minutes Intravenous Every 24 hours 03/18/20 1608     03/19/20 0300  ceFEPIme (MAXIPIME) 2 g in sodium chloride 0.9 % 100 mL IVPB        2 g 200 mL/hr over 30 Minutes Intravenous Every 12 hours 03/18/20 1618     03/19/20 0000  metroNIDAZOLE (FLAGYL) IVPB 500 mg        500 mg 100 mL/hr over 60 Minutes Intravenous Every 8 hours 03/18/20 1553     03/18/20 1600  ceFEPIme (MAXIPIME) 2 g in sodium chloride 0.9 % 100 mL IVPB  Status:  Discontinued        2 g 200 mL/hr over 30 Minutes Intravenous  Once 03/18/20 1553 03/18/20 1612   03/18/20 1600  vancomycin (VANCOCIN) IVPB 1000 mg/200 mL premix  Status:  Discontinued        1,000 mg 200 mL/hr over 60 Minutes Intravenous  Once 03/18/20 1553 03/18/20 1608   03/18/20 1515  vancomycin (VANCOCIN) IVPB 1000 mg/200 mL premix        1,000 mg 200 mL/hr over 60 Minutes Intravenous  Once 03/18/20 1409 03/18/20 1614   03/18/20 1415  vancomycin (VANCOCIN) IVPB 1000 mg/200 mL premix  Status:  Discontinued        1,000 mg 200 mL/hr over 60 Minutes Intravenous  Once 03/18/20 1407 03/18/20 1409   03/18/20 1400  vancomycin (VANCOREADY) IVPB 2000 mg/400 mL  Status:  Discontinued        2,000 mg 200 mL/hr over 120 Minutes Intravenous  Once 03/18/20 1357 03/18/20 1405   03/18/20 1345  ceFEPIme (MAXIPIME) 2 g in sodium chloride 0.9 % 100 mL IVPB        2 g 200 mL/hr over 30 Minutes Intravenous  Once 03/18/20 1340 03/18/20 1433    03/18/20 1345  metroNIDAZOLE (FLAGYL) IVPB 500 mg        500 mg 100 mL/hr over 60 Minutes Intravenous  Once 03/18/20 1340 03/18/20 1646   03/18/20 1345  vancomycin (VANCOCIN) IVPB 1000 mg/200 mL premix  Status:  Discontinued        1,000 mg 200 mL/hr over 60 Minutes Intravenous  Once 03/18/20 1340 03/18/20 1511      Current Facility-Administered Medications  Medication Dose Route Frequency Provider Last Rate Last Admin  . acetaminophen (TYLENOL) tablet 650 mg  650 mg Oral Q6H PRN Agbata, Tochukwu, MD       Or  . acetaminophen (TYLENOL) suppository 650 mg  650 mg Rectal Q6H PRN Agbata, Tochukwu, MD      . ceFEPIme (MAXIPIME) 2 g in sodium chloride 0.9 % 100 mL IVPB  2 g Intravenous Q12H Rauer, Robyne Peers, RPH   Stopped at 03/19/20 1728  . heparin injection 5,000 Units  5,000 Units Subcutaneous Eliezer Lofts, MD   5,000 Units at 03/19/20 1650  . insulin aspart (novoLOG) injection 0-20 Units  0-20 Units Subcutaneous TID WC Agbata, Tochukwu, MD  7 Units at 03/19/20 1716  . metroNIDAZOLE (FLAGYL) IVPB 500 mg  500 mg Intravenous Q8H Agbata, Tochukwu, MD   Stopped at 03/19/20 1828  . mirabegron ER (MYRBETRIQ) tablet 50 mg  50 mg Oral Daily Agbata, Tochukwu, MD      . norepinephrine (LEVOPHED) 4mg  in premix infusion  2-20 mcg/min Intravenous Titrated , NP 18.75 mL/hr at 03/19/20 1851 5 mcg/min at 03/19/20 1851  . ondansetron (ZOFRAN) tablet 4 mg  4 mg Oral Q6H PRN Agbata, Tochukwu, MD       Or  . ondansetron (ZOFRAN) injection 4 mg  4 mg Intravenous Q6H PRN Agbata, Tochukwu, MD      . Oxcarbazepine (TRILEPTAL) tablet 300 mg  300 mg Oral TID Agbata, Tochukwu, MD   300 mg at 03/19/20 1650  . QUEtiapine (SEROQUEL) tablet 100 mg  100 mg Oral QHS Agbata, Tochukwu, MD   100 mg at 03/18/20 2130  . QUEtiapine (SEROQUEL) tablet 50 mg  50 mg Oral BID PRN Agbata, Tochukwu, MD   50 mg at 03/18/20 1811  . rosuvastatin (CRESTOR) tablet 20 mg  20 mg Oral Daily Agbata, Tochukwu,  MD      . vancomycin (VANCOREADY) IVPB 1250 mg/250 mL  1,250 mg Intravenous Q24H Rauer, Samantha O, RPH 166.7 mL/hr at 03/19/20 1827 1,250 mg at 03/19/20 1827   Current Outpatient Medications  Medication Sig Dispense Refill  . ELIQUIS 5 MG TABS tablet Take 5 mg by mouth 2 (two) times daily.     03/21/20 glipiZIDE (GLUCOTROL XL) 10 MG 24 hr tablet Take 10 mg by mouth daily with breakfast.     . memantine (NAMENDA) 10 MG tablet Take 10 mg by mouth 2 (two) times daily.    . Olmesartan-amLODIPine-HCTZ 20-5-12.5 MG TABS Take 1 tablet by mouth daily.    . Oxcarbazepine (TRILEPTAL) 300 MG tablet Take 1 tablet (300 mg total) by mouth 3 (three) times daily. 90 tablet 1  . QUEtiapine (SEROQUEL) 100 MG tablet TAKE 1 TABLET BY MOUTH AT BEDTIME (Patient taking differently: Take 100 mg by mouth at bedtime. ) 30 tablet 0  . QUEtiapine (SEROQUEL) 50 MG tablet Take 1 tablet (50 mg total) by mouth 2 (two) times daily as needed. For severe anxiety and agitation 60 tablet 1  . rosuvastatin (CRESTOR) 20 MG tablet Take 20 mg by mouth daily.     . vitamin B-12 (CYANOCOBALAMIN) 1000 MCG tablet Take 3,000 mcg by mouth daily.    . mirabegron ER (MYRBETRIQ) 50 MG TB24 tablet Take 1 tablet (50 mg total) by mouth daily. (Patient not taking: Reported on 03/18/2020) 30 tablet 11     Objective: Vital signs in last 24 hours: Temp:  [99.2 F (37.3 C)-100.1 F (37.8 C)] 100.1 F (37.8 C) (09/19 1700) Pulse Rate:  [66-101] 73 (09/19 1850) Resp:  [3-32] 24 (09/19 1850) BP: (66-132)/(44-97) 105/69 (09/19 1850) SpO2:  [90 %-100 %] 97 % (09/19 1850)  Intake/Output from previous day: 09/18 0701 - 09/19 0700 In: 3300 [IV Piggyback:3300] Out: 1450 [Urine:1450] Intake/Output this shift: No intake/output data recorded.   Physical Exam Vitals reviewed.  Constitutional:      Appearance: Normal appearance.  Genitourinary:    Comments: Urine clear in foley bag.  Neurological:     Mental Status: He is alert.     Lab Results:   Recent Labs    03/18/20 1219 03/18/20 1219 03/18/20 2221 03/19/20 0230  WBC 12.6*  --   --  11.5*  HGB 10.7*   < >  9.0* 9.0*  HCT 32.1*   < > 28.0* 26.7*  PLT 130*  --   --  125*   < > = values in this interval not displayed.   BMET Recent Labs    03/18/20 1219 03/19/20 0230  NA 127* 129*  K 4.0 3.9  CL 97* 102  CO2 18* 19*  GLUCOSE 118* 106*  BUN 46* 36*  CREATININE 2.06* 1.78*  CALCIUM 8.2* 7.5*   PT/INR Recent Labs    03/18/20 1219 03/19/20 0230  LABPROT 15.2 16.2*  INR 1.3* 1.4*   ABG No results for input(s): PHART, HCO3 in the last 72 hours.  Invalid input(s): PCO2, PO2  Studies/Results: US Venous Img Lower Unilateral Right (DVT)  Result Date: 03/18/2020 CLINICAL DATA:  Swelling EXAM: RIGHT LOWER EXTREMITY VENOUS DOPPLER ULTRASOUND TECHNIQUE: Gray-scale sonography with compression, as well as color and duplex ultrasound, were performed to evaluate the deep venous system(s) from the level of the common femoral vein through the popliteal and proximal calf veins. COMPARISON:  None. FINDINGS: VENOUS Normal compressibility of the common femoral, superficial femoral, and popliteal veins, as well as the visualized calf veins. Visualized portions of profunda femoral vein and great saphenous vein unremarkable. No filling defects to suggest DVT on grayscale or color Doppler imaging. Doppler waveforms show normal direction of venous flow, normal respiratory plasticity and response to augmentation. Limited views of the contralateral common femoral vein are unremarkable. OTHER None. Limitations: none IMPRESSION: No acute DVT. Electronically Signed   By: Meda Klinefelter MD   On: 03/18/2020 16:47   DG Chest Port 1 View  Result Date: 03/18/2020 CLINICAL DATA:  Questionable sepsis. EXAM: PORTABLE CHEST 1 VIEW COMPARISON:  None. FINDINGS: The heart, hila, mediastinum are normal. No pneumothorax. Mild vascular crowding in the medial right lung base. No convincing evidence of  infiltrate. IMPRESSION: No convincing evidence of pneumonia. Vascular crowding in the medial right lung base. Electronically Signed   By: Gerome Sam III M.D   On: 03/18/2020 12:49     Assessment and Plan: Chronic retention with urethral stricture and difficult catheter placement.  Tolerating the foley well.  He will need the foley for a couple of weeks prior to resuming CIC.  Sepsis of urinary origin with proteus.  He is improving.  Continue current care.       LOS: 1 day    [name] 03/19/2020 [phone] ID: [name], male   DOB: 16-Aug-1955, 64 y.o.   MRN: [id number]

## 2020-03-19 NOTE — Assessment & Plan Note (Addendum)
-   seen by [facility] on 9/20, appreciate assistance - Recommendations: "Topical treatment orders provided for bedside nurses to perform to absorb drainage and promote healing as follows:Pack sacrum with Aquacel dressing Q day, then cover with foam dressing. (Change foam dressing Q 3 days or PRN soiling. ) Pt should resume follow-up with the outpatient wound care center after discharge."

## 2020-03-19 NOTE — Assessment & Plan Note (Addendum)
-   on chronic Eliquis  - hold Eliquis for now until need for surgery is ruled out - use HSQ for DVT ppx
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
## 2020-03-19 NOTE — ED Notes (Signed)
Right lower leg noted to be erythemic, warm, and swollen with skin taught; no break in skin noted. Pulses distal (pedal) are strong and equal to left foot.
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
## 2020-03-19 NOTE — Assessment & Plan Note (Signed)
-   patient has chronic urinary retention, on CIC at home; admitted with acute on chronic retention - urology evaluated in ER, appreciate assistance; cath placed over sensor wire - patient will need cath in place for 1-2 weeks per urology with outpatient followup with [facility] prior to reattempting CIC (self-cath)

## 2020-03-19 NOTE — Assessment & Plan Note (Signed)
-   see severe sepsis 

## 2020-03-19 NOTE — Assessment & Plan Note (Addendum)
-   tachycardic, tachypnea, WBC 12.6, lactic acid 4.6. Source considered urinary (UA consistent and patient had severe urinary retention). Given IVF and still with hypotension, then started on Levophed -Continue Levophed for now. PCCM consulted, if ongoing vasopressor support needed, will require CVL placement; currently being weaned and almost off - continue vanc, cefepime, and flagyl; will de-escalate as able - follow up blood and urine cultures.  Urine culture starting to grow Proteus -Continue antibiotics and following cultures for further results; likely de-escalate on 9/21 further - repeat lactic still normal this am, 9/20

## 2020-03-19 NOTE — ED Notes (Signed)
Pt ate 75% of breakfast tray. Gave pt cup of water.

## 2020-03-20 ENCOUNTER — Telehealth: Payer: Self-pay | Admitting: Urology

## 2020-03-20 LAB — CBC WITH DIFFERENTIAL/PLATELET
Abs Immature Granulocytes: 0.04 10*3/uL (ref 0.00–0.07)
Basophils Absolute: 0 10*3/uL (ref 0.0–0.1)
Basophils Relative: 0 %
Eosinophils Absolute: 0.2 10*3/uL (ref 0.0–0.5)
Eosinophils Relative: 2 %
HCT: 25.5 % — ABNORMAL LOW (ref 39.0–52.0)
Hemoglobin: 8.3 g/dL — ABNORMAL LOW (ref 13.0–17.0)
Immature Granulocytes: 1 %
Lymphocytes Relative: 12 %
Lymphs Abs: 0.8 10*3/uL (ref 0.7–4.0)
MCH: 27.9 pg (ref 26.0–34.0)
MCHC: 32.5 g/dL (ref 30.0–36.0)
MCV: 85.6 fL (ref 80.0–100.0)
Monocytes Absolute: 0.6 10*3/uL (ref 0.1–1.0)
Monocytes Relative: 9 %
Neutro Abs: 5.4 10*3/uL (ref 1.7–7.7)
Neutrophils Relative %: 76 %
Platelets: 98 10*3/uL — ABNORMAL LOW (ref 150–400)
RBC: 2.98 MIL/uL — ABNORMAL LOW (ref 4.22–5.81)
RDW: 18.1 % — ABNORMAL HIGH (ref 11.5–15.5)
WBC: 7.1 10*3/uL (ref 4.0–10.5)
nRBC: 0 % (ref 0.0–0.2)

## 2020-03-20 LAB — BASIC METABOLIC PANEL
Anion gap: 9 (ref 5–15)
BUN: 33 mg/dL — ABNORMAL HIGH (ref 8–23)
CO2: 17 mmol/L — ABNORMAL LOW (ref 22–32)
Calcium: 7.6 mg/dL — ABNORMAL LOW (ref 8.9–10.3)
Chloride: 104 mmol/L (ref 98–111)
Creatinine, Ser: 1.96 mg/dL — ABNORMAL HIGH (ref 0.61–1.24)
GFR calc Af Amer: 41 mL/min — ABNORMAL LOW (ref >60)
GFR calc non Af Amer: 35 mL/min — ABNORMAL LOW (ref >60)
Glucose, Bld: 85 mg/dL (ref 70–99)
Potassium: 3.6 mmol/L (ref 3.5–5.1)
Sodium: 130 mmol/L — ABNORMAL LOW (ref 135–145)

## 2020-03-20 LAB — LACTIC ACID, PLASMA: Lactic Acid, Venous: 0.9 mmol/L (ref 0.5–1.9)

## 2020-03-20 LAB — URINE CULTURE: Culture: >=100000 — AB

## 2020-03-20 LAB — MAGNESIUM: Magnesium: 1.8 mg/dL (ref 1.7–2.4)

## 2020-03-20 LAB — GLUCOSE, CAPILLARY
Glucose-Capillary: 106 mg/dL — ABNORMAL HIGH (ref 70–99)
Glucose-Capillary: 164 mg/dL — ABNORMAL HIGH (ref 70–99)

## 2020-03-20 MED ORDER — LACTATED RINGERS IV SOLN: INTRAVENOUS | Status: AC

## 2020-03-20 MED ORDER — SODIUM CHLORIDE 0.9 % IV SOLN
2.0000 g | INTRAVENOUS | Status: DC
  Administered 2020-03-20 – 2020-03-21 (×2): 2 g via INTRAVENOUS
  Filled 2020-03-20 (×3): qty 20

## 2020-03-20 NOTE — Telephone Encounter (Signed)
APP MADE ° °

## 2020-03-20 NOTE — ED Notes (Addendum)
PT wife to bedside, assisted pt with bed bath, dressing change to sacral wound and linen change.  Wife assisted pt to stand by bed during linen change.  Gown changed, pt remains clean and dry, wife updated in POC and reasons for pt remaining in ED.  Wife verbalizes understanding of information shared, thankful to staff.  Pt placed back on monitors and IVF continues.
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
## 2020-03-20 NOTE — ED Notes (Signed)
Pt given meal tray.

## 2020-03-20 NOTE — Consult Note (Addendum)
WOC Nurse Consult Note: Consult requested for sacrum.  Performed remotely after review of progress notes and photos in the EMR.  Pt is familiar to WOC from recent admission in July and has been followed by the outpatient wound center prior to admission.  Previously was offered to order a low airloss mattress to reduce pressure to the site but patient declined and stated "I don't like them.".  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  (Change foam dressing Q 3 days or PRN soiling. ) Pt should resume follow-up with the outpatient wound care center after discharge.  Pt has a colostomy pouch which has been in place for "awhile" and has been using a 2 piece pouching system prior to admission.  Instructions provided for staff nurses to assist with application and emptying. Please re-consult if further assistance is needed.  Thank-you,  Cammie Mcgee MSN, RN, CWOCN, [place], CNS [phone]

## 2020-03-21 LAB — CBC WITH DIFFERENTIAL/PLATELET
Abs Immature Granulocytes: 0.04 10*3/uL (ref 0.00–0.07)
Basophils Absolute: 0 10*3/uL (ref 0.0–0.1)
Basophils Relative: 0 %
Eosinophils Absolute: 0.2 10*3/uL (ref 0.0–0.5)
Eosinophils Relative: 3 %
HCT: 25.6 % — ABNORMAL LOW (ref 39.0–52.0)
Hemoglobin: 8.8 g/dL — ABNORMAL LOW (ref 13.0–17.0)
Immature Granulocytes: 1 %
Lymphocytes Relative: 12 %
Lymphs Abs: 0.8 10*3/uL (ref 0.7–4.0)
MCH: 28.6 pg (ref 26.0–34.0)
MCHC: 34.4 g/dL (ref 30.0–36.0)
MCV: 83.1 fL (ref 80.0–100.0)
Monocytes Absolute: 0.6 10*3/uL (ref 0.1–1.0)
Monocytes Relative: 9 %
Neutro Abs: 5.1 10*3/uL (ref 1.7–7.7)
Neutrophils Relative %: 75 %
Platelets: 98 10*3/uL — ABNORMAL LOW (ref 150–400)
RBC: 3.08 MIL/uL — ABNORMAL LOW (ref 4.22–5.81)
RDW: 18 % — ABNORMAL HIGH (ref 11.5–15.5)
WBC: 6.8 10*3/uL (ref 4.0–10.5)
nRBC: 0 % (ref 0.0–0.2)

## 2020-03-21 LAB — BASIC METABOLIC PANEL
Anion gap: 7 (ref 5–15)
BUN: 27 mg/dL — ABNORMAL HIGH (ref 8–23)
CO2: 17 mmol/L — ABNORMAL LOW (ref 22–32)
Calcium: 7.9 mg/dL — ABNORMAL LOW (ref 8.9–10.3)
Chloride: 106 mmol/L (ref 98–111)
Creatinine, Ser: 1.4 mg/dL — ABNORMAL HIGH (ref 0.61–1.24)
GFR calc Af Amer: >60 mL/min (ref >60)
GFR calc non Af Amer: 53 mL/min — ABNORMAL LOW (ref >60)
Glucose, Bld: 120 mg/dL — ABNORMAL HIGH (ref 70–99)
Potassium: 4 mmol/L (ref 3.5–5.1)
Sodium: 130 mmol/L — ABNORMAL LOW (ref 135–145)

## 2020-03-21 LAB — GLUCOSE, CAPILLARY
Glucose-Capillary: 101 mg/dL — ABNORMAL HIGH (ref 70–99)
Glucose-Capillary: 144 mg/dL — ABNORMAL HIGH (ref 70–99)
Glucose-Capillary: 124 mg/dL — ABNORMAL HIGH (ref 70–99)
Glucose-Capillary: 110 mg/dL — ABNORMAL HIGH (ref 70–99)

## 2020-03-21 LAB — MAGNESIUM: Magnesium: 2 mg/dL (ref 1.7–2.4)

## 2020-03-21 MED ORDER — CHLORHEXIDINE GLUCONATE CLOTH 2 % EX PADS
6.0000 | MEDICATED_PAD | Freq: Every day | CUTANEOUS | Status: DC
  Administered 2020-03-21 – 2020-03-22 (×2): 6 via TOPICAL

## 2020-03-21 MED ORDER — MEMANTINE HCL 10 MG PO TABS
10.0000 mg | ORAL_TABLET | Freq: Two times a day (BID) | ORAL | Status: DC
  Administered 2020-03-21 – 2020-03-22 (×2): 10 mg via ORAL
  Filled 2020-03-21 (×3): qty 1

## 2020-03-21 MED ORDER — APIXABAN 5 MG PO TABS
5.0000 mg | ORAL_TABLET | Freq: Two times a day (BID) | ORAL | Status: DC
  Administered 2020-03-21 – 2020-03-22 (×2): 5 mg via ORAL
  Filled 2020-03-21 (×2): qty 1

## 2020-03-21 MED ORDER — INFLUENZA VAC SPLIT QUAD 0.5 ML IM SUSY
0.5000 mL | PREFILLED_SYRINGE | INTRAMUSCULAR | Status: AC
  Administered 2020-03-22: 0.5 mL via INTRAMUSCULAR
  Filled 2020-03-21: qty 0.5

## 2020-03-21 NOTE — Evaluation (Addendum)
Physical Therapy Evaluation Patient Details Name: [name] MRN: [id number] DOB: [birth year] Today's Date: 03/21/2020   History of Present Illness  Pt is a 64 y.o. 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 Per MD impression, pt currently presents with sepsis due to UTI and cellulitis of sacral regio, elevated troponin likely due to demand ischemia, and acute metabolic encephalopathy.  Clinical Impression  Pt admitted with above diagnosis. Pt currently with functional limitations due to the deficits listed below (see "PT Problem List"). Upon entry, pt in bed, awake and agreeable to participate. The pt is alert and oriented x4, pleasant, conversational, and generally a good historian. maxA for bed mobility, maxA for STS transfers. Pt tolerates standing for 2x30sec c RW, but has difficulty obtaining balance and upright stance. Pt too weak to attempt AMB at this time. Author was not able to visualize and sacral area wound whilst standing (although dressing is covering coccygeal area, unable to visualize). Pt has two Rt heel ulcers, both appear to be chronic and in different stages of heeling). Functional mobility assessment demonstrates increased effort/time requirements, poor tolerance, and need for physical assistance, whereas the patient performed these at a higher level of independence PTA. Pt will benefit from skilled PT intervention to increase independence and safety with basic mobility in preparation for discharge to the venue listed below.       Follow Up Recommendations SNF;Supervision for mobility/OOB    Equipment Recommendations  None recommended by PT    Recommendations for Other Services       Precautions / Restrictions Precautions Precautions: Fall Precaution Comments: Rt heel ulcers, Sacral ulcer      Mobility  Bed Mobility Overal bed  mobility: Modified Independent;Needs Assistance Bed Mobility: Supine to Sit;Sit to Supine     Supine to sit: Min assist Sit to supine: Max assist;+2 for physical assistance   General bed mobility comments: unable to help much with legs into bed; poor capacity for trunk positioning once back to supine (+2 to pull up in bed)  Transfers Overall transfer level: Needs assistance Equipment used: Rolling walker (2 wheeled) Transfers: Sit to/from Stand Sit to Stand: Mod assist;Max assist         General transfer comment: still requires minA from elevated surface  Ambulation/Gait Ambulation/Gait assistance:  (able to take some steps laterally toward [facility], but too weak to safely attempt further AMB, pt agrees)              Information systems manager [place] (Stroke Patients Only)       Balance                                             Pertinent Vitals/Pain Pain Assessment: No/denies pain    Home Living Family/patient expects to be discharged to:: Private residence Living Arrangements: Spouse/significant other Available Help at Discharge: Family;Available PRN/intermittently Type of Home: House Home Access: Stairs to enter Entrance Stairs-Rails: None Entrance Stairs-Number of Steps: 2x 1 Home Layout: One level Home Equipment: Walker - 2 wheels;Shower seat      Prior Function Level of Independence: Needs assistance   Gait / Transfers Assistance Needed: pt reports able to ambulate independently throughout home; uses a scooter if out of  the home  ADL's / Homemaking Assistance Needed: States that his spouse assists in med mgt, cooks and cleans, helps pt with LB dressing and some aspects of bathing.  Comments: Pt reports wound care nurse comes to help with his sacral wound MWF     Hand Dominance   Dominant Hand: Right    Extremity/Trunk Assessment   Upper Extremity Assessment Upper Extremity Assessment: Generalized  weakness    Lower Extremity Assessment Lower Extremity Assessment: Generalized weakness       Communication   Communication: No difficulties  Cognition Arousal/Alertness: Awake/alert Behavior During Therapy: WFL for tasks assessed/performed Overall Cognitive Status: Within Functional Limits for tasks assessed                                        General Comments      Exercises     Assessment/Plan    PT Assessment Patient needs continued PT services  PT Problem List Decreased strength;Decreased activity tolerance;Decreased mobility;Decreased balance       PT Treatment Interventions DME instruction;Balance training;Gait training;Stair training;Functional mobility training;Therapeutic activities;Therapeutic exercise;Patient/family education    PT Goals (Current goals can be found in the Care Plan section)  Acute Rehab PT Goals Patient Stated Goal: regain strength PT Goal Formulation: With patient Time For Goal Achievement: 04/04/20 Potential to Achieve Goals: Good    Frequency Min 2X/week   Barriers to discharge Decreased caregiver support unclear how much physical assist wife can provide    Co-evaluation               AM-PAC PT "6 Clicks" Mobility  Outcome Measure Help needed turning from your back to your side while in a flat bed without using bedrails?: A Little Help needed moving from lying on your back to sitting on the side of a flat bed without using bedrails?: A Little Help needed moving to and from a bed to a chair (including a wheelchair)?: A Lot Help needed standing up from a chair using your arms (e.g., wheelchair or bedside chair)?: A Lot Help needed to walk in hospital room?: Total Help needed climbing 3-5 steps with a railing? : Total 6 Click Score: 12    End of Session Equipment Utilized During Treatment: Gait belt Activity Tolerance: Patient tolerated treatment well;Patient limited by fatigue;No increased pain Patient  left: in bed;with nursing/sitter in room;with bed alarm set;with call bell/phone within reach Nurse Communication: Mobility status PT Visit Diagnosis: Muscle weakness (generalized) (M62.81);Other abnormalities of gait and mobility (R26.89);Difficulty in walking, not elsewhere classified (R26.2)    Time: 2993-7169 PT Time Calculation (min) (ACUTE ONLY): 29 min   Charges:   PT Evaluation $PT Eval High Complexity: 1 High PT Treatments $Therapeutic Exercise: 8-22 mins       2:01 PM, 03/21/20 Rosamaria Lints, PT, DPT Physical Therapist - [facility]  [phone] (ASCOM)    Nature Vogelsang C 03/21/2020, 2:01 PM

## 2020-03-21 NOTE — Care Management Important Message (Signed)
Important Message  Patient Details  Name: [name] MRN: [id number] Date of Birth: [birth year]   Medicare Important Message Given:  Yes     [name] 03/21/2020, 11:18 AM

## 2020-03-21 NOTE — NC FL2 (Signed)
[place] MEDICAID FL2 LEVEL OF CARE SCREENING TOOL     IDENTIFICATION  Patient Name: [name] Birthdate: 05-Apr-1956 Sex: male Admission Date (Current Location): 03/18/2020  [place] and [place] Number:  Chiropodist and Address:  [facility], [address] [place], [place] [address]      Provider Number: [id number]  Attending Physician Name and Address:  Arnetha Courser, MD  Relative Name and Phone Number:       Current Level of Care: Hospital Recommended Level of Care: Skilled Nursing Facility Prior Approval Number:    Date Approved/Denied:   PASRR Number: [id number]  Discharge Plan: SNF    Current Diagnoses: Patient Active Problem List   Diagnosis Date Noted  . Urinary retention 03/19/2020  . History of DVT (deep vein thrombosis) 03/19/2020  . Pressure injury of sacral region, stage 4 (HCC) 03/19/2020  . [name] sepsis with septic shock (HCC) 03/18/2020  . Obesity, Class III, BMI 40-49.9 (morbid obesity) (HCC) 03/18/2020  . Cellulitis 03/18/2020  . Acute cystitis without hematuria   . Pressure injury of skin 01/17/2020  . DVT (deep venous thrombosis) (HCC) 01/16/2020  . Cellulitis of sacral region 01/16/2020  . HLD (hyperlipidemia) 01/16/2020  . Bipolar disorder, in full remission, most recent episode mixed (HCC) 12/29/2019  . High risk medication use 10/25/2019  . Noncompliance with treatment plan 10/25/2019  . Bipolar I disorder, most recent episode mixed (HCC) 01/07/2019  . GAD (generalized anxiety disorder) 01/07/2019  . Insomnia due to medical condition 01/07/2019  . DVT, recurrent, lower extremity, acute (HCC) 03/06/2016  . Bipolar disorder (HCC) 01/03/2016  . Lithium toxicity 01/02/2016  . Chronic anticoagulation 07/06/2014  . 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 Hyperlipidemia 07/06/2014  . Hypertension 07/06/2014  . Leg swelling 07/06/2014  . Obesity,  Class II, BMI 35-39.9, with comorbidity 07/06/2014  . On medication for venous thromboembolism 07/06/2014  . Sleep apnea 07/06/2014  . Type II diabetes mellitus with renal manifestations (HCC) 07/06/2014  . 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 Venous thromboembolism (VTE) 07/06/2014    Orientation RESPIRATION BLADDER Height & Weight     Situation, Time, Self, Place  Normal Continent Weight: 250 lb (113.4 kg) Height:  5\' 6"  (167.6 cm)  BEHAVIORAL SYMPTOMS/MOOD NEUROLOGICAL BOWEL NUTRITION STATUS      Continent Diet (carb modified, thin liquids)  AMBULATORY STATUS COMMUNICATION OF NEEDS Skin   Extensive Assist Verbally PU Stage and Appropriate Care       PU Stage 4 Dressing:  (Sacrum, foam dressing, change every 3 days)               Personal Care Assistance Level of Assistance  Bathing, Feeding, Dressing Bathing Assistance: Maximum assistance Feeding assistance: Independent Dressing Assistance: Maximum assistance     Functional Limitations Info  Sight, Hearing, Speech Sight Info: Adequate Hearing Info: Adequate Speech Info: Adequate    SPECIAL CARE FACTORS FREQUENCY  PT (By licensed PT), OT (By licensed OT)     PT Frequency: 5x OT Frequency: 5x            Contractures Contractures Info: Not present    Additional Factors Info  Code Status, Allergies Code Status Info: Full Code Allergies Info: no known allergies           Current Medications (03/21/2020):  This is the current hospital active medication list Current Facility-Administered Medications  Medication Dose Route Frequency Provider Last Rate Last Admin  . acetaminophen (TYLENOL) tablet 650 mg  650 mg Oral Q6H PRN Agbata, Tochukwu, MD       Or  . acetaminophen (TYLENOL) suppository 650 mg  650 mg Rectal Q6H PRN Agbata, Tochukwu, MD      . cefTRIAXone (ROCEPHIN) 2 g in sodium chloride 0.9 % 100 mL IVPB  2 g Intravenous Q24H Lewie Chamber, MD 200 mL/hr at 03/21/20 1416 2 g at 03/21/20 1416   . Chlorhexidine Gluconate Cloth 2 % PADS 6 each  6 each Topical Daily Lewie Chamber, MD      . heparin injection 5,000 Units  5,000 Units Subcutaneous Eliezer Lofts, MD   5,000 Units at 03/21/20 1413  . [START ON 03/22/2020] influenza vac split quadrivalent PF (FLUARIX) injection 0.5 mL  0.5 mL Intramuscular Tomorrow-1000 Lewie Chamber, MD      . insulin aspart (novoLOG) injection 0-20 Units  0-20 Units Subcutaneous TID WC Agbata, Tochukwu, MD   3 Units at 03/21/20 1300  . mirabegron ER (MYRBETRIQ) tablet 50 mg  50 mg Oral Daily Agbata, Tochukwu, MD   50 mg at 03/21/20 1024  . norepinephrine (LEVOPHED) 4mg  in premix infusion  2-20 mcg/min Intravenous Titrated , NP   Stopped at 03/20/20 1046  . ondansetron (ZOFRAN) tablet 4 mg  4 mg Oral Q6H PRN Agbata, Tochukwu, MD       Or  . ondansetron (ZOFRAN) injection 4 mg  4 mg Intravenous Q6H PRN Agbata, Tochukwu, MD      . Oxcarbazepine (TRILEPTAL) tablet 300 mg  300 mg Oral TID Agbata, Tochukwu, MD   300 mg at 03/21/20 1025  . QUEtiapine (SEROQUEL) tablet 100 mg  100 mg Oral QHS Agbata, Tochukwu, MD   100 mg at 03/20/20 2150  . QUEtiapine (SEROQUEL) tablet 50 mg  50 mg Oral BID PRN Agbata, Tochukwu, MD   50 mg at 03/18/20 1811  . rosuvastatin (CRESTOR) tablet 20 mg  20 mg Oral Daily Agbata, Tochukwu, MD   20 mg at 03/21/20 1023     Discharge Medications: Please see discharge summary for a list of discharge medications.  Relevant Imaging Results:  Relevant Lab Results:   Additional Information SSN: 03/23/20  [id number], LCSW

## 2020-03-21 NOTE — TOC Initial Note (Signed)
Transition of Care [facility]) - Initial/Assessment Note    Patient Details  Name: [name] MRN: [id number] Date of Birth: 13-Jul-1955  Transition of Care [facility]) CM/SW Contact:    Maree Krabbe, LCSW Phone Number: 03/21/2020, 3:55 PM  Clinical Narrative:  Pt lives at home with spouse. Pt has been to [facility] in the past. Pt states he would like to talk to his spouse before he decides if he wants to return there. CSW will follow up with pt tomorrow (9/22).                 Expected Discharge Plan: Skilled Nursing Facility Barriers to Discharge: Continued Medical Work up   Patient Goals and CMS Choice Patient states their goals for this hospitalization and ongoing recovery are:: to get better   Choice offered to / list presented to : Patient  Expected Discharge Plan and Services Expected Discharge Plan: Skilled Nursing Facility In-house Referral: Clinical Social Work   Post Acute Care Choice: Skilled Nursing Facility Living arrangements for the past 2 months: Single Family Home                                      Prior Living Arrangements/Services Living arrangements for the past 2 months: Single Family Home Lives with:: Spouse Patient language and need for interpreter reviewed:: Yes Do you feel safe going back to the place where you live?: Yes      Need for Family Participation in Patient Care: Yes (Comment) Care giver support system in place?: Yes (comment)   Criminal Activity/Legal Involvement Pertinent to Current Situation/Hospitalization: No - Comment as needed  Activities of Daily Living Home Assistive Devices/Equipment: CBG Meter, Eyeglasses, Ostomy supplies, Walker (specify type) ADL Screening (condition at time of admission) Patient's cognitive ability adequate to safely complete daily activities?: Yes Is the patient deaf or have difficulty hearing?: No Does the patient have difficulty seeing, even when wearing glasses/contacts?: No Does  the patient have difficulty concentrating, remembering, or making decisions?: Yes Patient able to express need for assistance with ADLs?: Yes Does the patient have difficulty dressing or bathing?: Yes Independently performs ADLs?: No Communication: Independent Does the patient have difficulty walking or climbing stairs?: Yes Weakness of Legs: Both Weakness of Arms/Hands: Both  Permission Sought/Granted Permission sought to share information with : Family Supports    Share Information with NAME: [name]     Permission granted to share info w Relationship: spouse     Emotional Assessment Appearance:: Appears stated age Attitude/Demeanor/Rapport: Engaged Affect (typically observed): Accepting, Appropriate Orientation: : Oriented to Self, Oriented to Place, Oriented to  Time, Oriented to Situation Alcohol / Substance Use: Not Applicable Psych Involvement: No (comment)  Admission diagnosis:  Weakness [R53.1] DVT (deep venous thrombosis) (HCC) [I82.409] Severe sepsis with septic shock (HCC) [A41.9, R65.21] Fall, initial encounter [W19.XXXA] Wound of sacral region, initial encounter [S31.000A] Sepsis (HCC) [A41.9] Urinary tract infection without hematuria, site unspecified [N39.0] Patient Active Problem List   Diagnosis Date Noted  . Urinary retention 03/19/2020  . History of DVT (deep vein thrombosis) 03/19/2020  . Pressure injury of sacral region, stage 4 (HCC) 03/19/2020  . Severe sepsis with septic shock (HCC) 03/18/2020  . Obesity, Class III, BMI 40-49.9 (morbid obesity) (HCC) 03/18/2020  . Cellulitis 03/18/2020  . Acute cystitis without hematuria   . Pressure injury of skin 01/17/2020  . DVT (deep venous thrombosis) (HCC) 01/16/2020  .  Cellulitis of sacral region 01/16/2020  . HLD (hyperlipidemia) 01/16/2020  . Bipolar disorder, in full remission, most recent episode mixed (HCC) 12/29/2019  . High risk medication use 10/25/2019  . Noncompliance with treatment plan  10/25/2019  . Bipolar I disorder, most recent episode mixed (HCC) 01/07/2019  . GAD (generalized anxiety disorder) 01/07/2019  . Insomnia due to medical condition 01/07/2019  . DVT, recurrent, lower extremity, acute (HCC) 03/06/2016  . Bipolar disorder (HCC) 01/03/2016  . Lithium toxicity 01/02/2016  . Chronic anticoagulation 07/06/2014  . 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 Hyperlipidemia 07/06/2014  . Hypertension 07/06/2014  . Leg swelling 07/06/2014  . Obesity, Class II, BMI 35-39.9, with comorbidity 07/06/2014  . On medication for venous thromboembolism 07/06/2014  . Sleep apnea 07/06/2014  . Type II diabetes mellitus with renal manifestations (HCC) 07/06/2014  . 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 Venous thromboembolism (VTE) 07/06/2014   PCP:  Dione Booze, MD Pharmacy:   [facility] [address] [place] - [address] [place] [address] Phone: [phone] Fax: [phone]     Social Determinants of Health (SDOH) Interventions    Readmission Risk Interventions No flowsheet data found.
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
## 2020-03-22 LAB — CBC WITH DIFFERENTIAL/PLATELET
Abs Immature Granulocytes: 0.06 10*3/uL (ref 0.00–0.07)
Basophils Absolute: 0 10*3/uL (ref 0.0–0.1)
Basophils Relative: 1 %
Eosinophils Absolute: 0.3 10*3/uL (ref 0.0–0.5)
Eosinophils Relative: 5 %
HCT: 27.8 % — ABNORMAL LOW (ref 39.0–52.0)
Hemoglobin: 9.4 g/dL — ABNORMAL LOW (ref 13.0–17.0)
Immature Granulocytes: 1 %
Lymphocytes Relative: 18 %
Lymphs Abs: 1 10*3/uL (ref 0.7–4.0)
MCH: 28.4 pg (ref 26.0–34.0)
MCHC: 33.8 g/dL (ref 30.0–36.0)
MCV: 84 fL (ref 80.0–100.0)
Monocytes Absolute: 0.6 10*3/uL (ref 0.1–1.0)
Monocytes Relative: 10 %
Neutro Abs: 3.8 10*3/uL (ref 1.7–7.7)
Neutrophils Relative %: 65 %
Platelets: 128 10*3/uL — ABNORMAL LOW (ref 150–400)
RBC: 3.31 MIL/uL — ABNORMAL LOW (ref 4.22–5.81)
RDW: 18.1 % — ABNORMAL HIGH (ref 11.5–15.5)
WBC: 5.8 10*3/uL (ref 4.0–10.5)
nRBC: 0 % (ref 0.0–0.2)

## 2020-03-22 LAB — BASIC METABOLIC PANEL
Anion gap: 7 (ref 5–15)
BUN: 24 mg/dL — ABNORMAL HIGH (ref 8–23)
CO2: 19 mmol/L — ABNORMAL LOW (ref 22–32)
Calcium: 8.3 mg/dL — ABNORMAL LOW (ref 8.9–10.3)
Chloride: 107 mmol/L (ref 98–111)
Creatinine, Ser: 1.32 mg/dL — ABNORMAL HIGH (ref 0.61–1.24)
GFR calc Af Amer: >60 mL/min (ref >60)
GFR calc non Af Amer: 57 mL/min — ABNORMAL LOW (ref >60)
Glucose, Bld: 124 mg/dL — ABNORMAL HIGH (ref 70–99)
Potassium: 4.3 mmol/L (ref 3.5–5.1)
Sodium: 133 mmol/L — ABNORMAL LOW (ref 135–145)

## 2020-03-22 LAB — GLUCOSE, CAPILLARY
Glucose-Capillary: 107 mg/dL — ABNORMAL HIGH (ref 70–99)
Glucose-Capillary: 161 mg/dL — ABNORMAL HIGH (ref 70–99)

## 2020-03-22 LAB — MAGNESIUM: Magnesium: 2.1 mg/dL (ref 1.7–2.4)

## 2020-03-22 MED ORDER — CEPHALEXIN 500 MG PO CAPS
500.0000 mg | ORAL_CAPSULE | Freq: Four times a day (QID) | ORAL | Status: DC
  Administered 2020-03-22: 500 mg via ORAL
  Filled 2020-03-22: qty 1

## 2020-03-22 MED ORDER — CEPHALEXIN 500 MG PO CAPS: 500.0000 mg | ORAL_CAPSULE | Freq: Four times a day (QID) | ORAL | 0 refills | Status: AC

## 2020-03-22 NOTE — Discharge Summary (Signed)
Physician Discharge Summary  [name] JQZ:[id number] DOB: 1956/02/06 DOA: 03/18/2020  PCP: Dione Booze, MD  Admit date: 03/18/2020 Discharge date: 03/22/2020  Admitted From: Home Disposition:Home    Recommendations for Outpatient Follow-up:  1. Follow up with PCP in 1-2 weeks 2. Follow-up with urology in 2 weeks 3. Please obtain BMP/CBC in one week 4. 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 He was unable to get up after the fall. He was also having fever and chills along with some swelling and redness of right lower extremity.  Patient also has a healing sacral area wound which started bleeding with fall.  Patient gets outpatient wound care. On arrival he was significantly hypotensive, not responding to IV hydration initially requiring Levophed which was now weaned off. Initially received broad-spectrum antibiotics with vancomycin, cefepime and Flagyl with sepsis protocol.  Blood cultures remain negative.  Urine cultures with Proteus mirabilis with good sensitivity.   Antibiotics initially switched with ceftriaxone and he was discharged on Keflex to complete a 7-day course.  Also on admission, he had difficulty with Foley catheter being placed by staff. Urology was consulted and he ultimately required a sensor wire to be placed within the bladder, then catheter threaded over the wire. He had good relief of urinary retention after this. Catheter was recommended  to remain in place for 1 to 2 weeks per urology with outpatient follow-up.  PT evaluation was obtained for overall weakness and physical deconditioning and they were recommending rehab.  Patient decided to go back home with home health services which were ordered. Patient will resume his wound care on discharge. Patient did had right lower extremity cellulitis on admission.  Symptoms improved with resolution of erythema.  Right leg continue to have mild edema which seems improving on discharge.  Patient did had AKI with history of CKD stage IIIb.  Most likely secondary to urinary retention which improved with Foley catheter.  Creatinine was at baseline on discharge.  Patient will continue rest of his home meds and follow-up with his primary care provider.  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 Please continue taking antibiotics for 4 more days. You are being discharged with Foley catheter, please follow-up with urology in 2 weeks for further recommendations. Please follow-up with your primary care provider.   Discharge wound care:   Complete by: As directed    Apply Aquacel to sacrum wound Q  day, then cover with foam dressing.  (Change foam dressing Q 3 days or PRN soiling.)   Increase activity slowly   Complete by: As directed      Allergies as of  03/22/2020   No Known Allergies     Medication List    TAKE these medications   cephALEXin 500 MG capsule Commonly known as: KEFLEX Take 1 capsule (500 mg total) by mouth every 6 (six) hours for 4 days.   Eliquis 5 MG Tabs tablet Generic drug: apixaban Take 5 mg by mouth 2 (two) times daily.   glipiZIDE 10 MG 24 hr tablet Commonly known as: GLUCOTROL XL Take 10 mg by mouth daily with breakfast.   memantine 10 MG tablet Commonly known as: NAMENDA Take 10 mg by mouth 2 (two) times daily.   mirabegron ER 50 MG Tb24 tablet Commonly known as: MYRBETRIQ Take 1 tablet (50 mg total) by mouth daily.   Olmesartan-amLODIPine-HCTZ 20-5-12.5 MG Tabs Take 1 tablet by mouth daily.   Oxcarbazepine 300 MG tablet Commonly known as: TRILEPTAL Take 1 tablet (300 mg total) by mouth 3 (three) times daily.   QUEtiapine 50 MG tablet Commonly known as: SEROquel Take 1 tablet (50 mg total) by mouth 2 (two) times daily as needed. For severe anxiety and agitation   QUEtiapine 100 MG tablet Commonly known as: SEROQUEL TAKE 1 TABLET BY MOUTH AT BEDTIME   rosuvastatin 20 MG tablet Commonly known as: CRESTOR Take 20 mg by mouth daily.   vitamin B-12 1000 MCG tablet Commonly known as: CYANOCOBALAMIN Take 3,000 mcg by mouth daily.            Discharge Care Instructions  (From admission, onward)         Start     Ordered   03/22/20 0000  Discharge wound care:       Comments: Apply Aquacel to sacrum wound Q day, then cover with foam dressing.  (Change foam dressing Q 3 days or PRN soiling.)   03/22/20 1216          Follow-up Information    Dione Booze, MD. Schedule an appointment as soon as possible for a visit on 03/28/2020.   Specialty: Internal Medicine Why: @ 10:30am Contact information: [address] [place] [address] [phone]        Bjorn Pippin, MD. Schedule an appointment as soon as possible for a visit on 04/04/2020.   Specialty: Urology Why: @ 8:45am  and 1pm Contact information: [address] [place] [address] [phone]              No Known Allergies  Consultations:  Urology  Procedures/Studies: US Venous Img Lower Unilateral Right (DVT)  Result Date: 03/18/2020 CLINICAL DATA:  Swelling EXAM: RIGHT LOWER EXTREMITY VENOUS DOPPLER ULTRASOUND TECHNIQUE: Gray-scale sonography with compression, as well as color and duplex ultrasound, were performed to evaluate the deep venous system(s) from the level of the common femoral vein through the popliteal and proximal calf veins. COMPARISON:  None. FINDINGS: VENOUS Normal compressibility of the common femoral, superficial femoral, and popliteal veins, as well as the visualized calf veins. Visualized portions of profunda femoral vein and great saphenous vein unremarkable. No filling defects to suggest DVT on grayscale or color Doppler imaging. Doppler waveforms show normal direction of venous flow, normal respiratory plasticity and response to augmentation. Limited views of the contralateral common femoral vein are unremarkable. OTHER None. Limitations: none IMPRESSION: No acute DVT. Electronically Signed   By: Judeth Cornfield  Peacock MD   On: 03/18/2020 16:47   DG Chest Port 1 View  Result Date: 03/18/2020 CLINICAL DATA:  Questionable sepsis. EXAM: PORTABLE CHEST 1 VIEW COMPARISON:  None. FINDINGS: The heart, hila, mediastinum are normal. No pneumothorax. Mild vascular crowding in the medial right lung base. No convincing evidence of infiltrate. IMPRESSION: No convincing evidence of pneumonia. Vascular crowding in the medial right lung base. Electronically Signed   By: Gerome Sam III M.D   On: 03/18/2020 12:49     Subjective: Patient was feeling better when seen today.  No new complaint.  Right leg pain and swelling is improving.  He wants to go home with home health services and declined SNF placement.  Discharge Exam: Vitals:   03/22/20 0813 03/22/20 1222  BP:  124/68 (!) 117/59  Pulse: 68 70  Resp: 18 18  Temp: 97.6 F (36.4 C) 98.1 F (36.7 C)  SpO2: 99% 100%   Vitals:   03/21/20 2023 03/22/20 0439 03/22/20 0813 03/22/20 1222  BP: 121/72 117/74 124/68 (!) 117/59  Pulse: (!) 58 60 68 70  Resp: 20 20 18 18   Temp: 99.3 F (37.4 C) 98.6 F (37 C) 97.6 F (36.4 C) 98.1 F (36.7 C)  TempSrc: Oral Oral Oral Oral  SpO2: 100% 100% 99% 100%  Weight:      Height:        General: Pt is alert, awake, not in acute distress Cardiovascular: RRR, S1/S2 +, no rubs, no gallops Respiratory: CTA bilaterally, no wheezing, no rhonchi Abdominal: Soft, NT, ND, bowel sounds + Extremities: 1+ right leg edema, no cyanosis   The results of significant diagnostics from this hospitalization (including imaging, microbiology, ancillary and laboratory) are listed below for reference.    Microbiology: Recent Results (from the past 240 hour(s))  Urine culture     Status: Abnormal   Collection Time: 03/18/20 12:19 PM   Specimen: Urine, Random  Result Value Ref Range Status   Specimen Description   Final    URINE, RANDOM Performed at [facility], [address] [place], [place]    Special Requests   Final    NONE Performed at [facility], [address] [place], [place]    Culture >=100,000 COLONIES/mL PROTEUS MIRABILIS (A)  Final   Report Status 03/20/2020 FINAL  Final   Organism ID, Bacteria PROTEUS MIRABILIS (A)  Final      Susceptibility   Proteus mirabilis - MIC*    AMPICILLIN <=2 SENSITIVE Sensitive     CEFAZOLIN <=4 SENSITIVE Sensitive     CEFTRIAXONE <=0.25 SENSITIVE Sensitive     CIPROFLOXACIN <=0.25 SENSITIVE Sensitive     GENTAMICIN <=1 SENSITIVE Sensitive     IMIPENEM 2 SENSITIVE Sensitive     NITROFURANTOIN 128 RESISTANT Resistant     TRIMETH/SULFA <=20 SENSITIVE Sensitive     AMPICILLIN/SULBACTAM <=2 SENSITIVE Sensitive     PIP/TAZO <=4 SENSITIVE Sensitive     * >=100,000 COLONIES/mL  PROTEUS MIRABILIS  Blood Culture (routine x 2)     Status: None (Preliminary result)   Collection Time: 03/18/20 12:19 PM   Specimen: BLOOD  Result Value Ref Range Status   Specimen Description BLOOD LEFT AC  Final   Special Requests   Final    BOTTLES DRAWN AEROBIC AND ANAEROBIC Blood Culture adequate volume   Culture   Final    NO GROWTH 4 DAYS Performed at [facility], [address] [place], [place]    Report  Status PENDING  Incomplete  Blood Culture (routine x 2)     Status: None (Preliminary result)   Collection Time: 03/18/20 12:19 PM   Specimen: BLOOD  Result Value Ref Range Status   Specimen Description BLOOD RIGHT [facility]  Final   Special Requests   Final    BOTTLES DRAWN AEROBIC AND ANAEROBIC Blood Culture adequate volume   Culture   Final    NO GROWTH 4 DAYS Performed at [facility], [address] [place], [place] [address]    Report Status PENDING  Incomplete  SARS Coronavirus 2 by RT PCR (hospital order, performed in [facility] hospital lab) Nasopharyngeal Nasopharyngeal Swab     Status: None   Collection Time: 03/18/20 12:22 PM   Specimen: Nasopharyngeal Swab  Result Value Ref Range Status   SARS Coronavirus 2 NEGATIVE NEGATIVE Final    Comment: (NOTE) SARS-CoV-2 target nucleic acids are NOT DETECTED.  The SARS-CoV-2 RNA is generally detectable in upper and lower respiratory specimens during the acute phase of infection. The lowest concentration of SARS-CoV-2 viral copies this assay can detect is 250 copies / mL. A negative result does not preclude SARS-CoV-2 infection and should not be used as the sole basis for treatment or other patient management decisions.  A negative result may occur with improper specimen collection / handling, submission of specimen other than nasopharyngeal swab, presence of viral mutation(s) within the areas targeted by this assay, and inadequate number of viral copies (<250 copies / mL). A negative  result must be combined with clinical observations, patient history, and epidemiological information.  Fact Sheet for Patients:   BoilerBrush.com.cy  Fact Sheet for Healthcare Providers: https://pope.com/  This test is not yet approved or  cleared by the [place] FDA and has been authorized for detection and/or diagnosis of SARS-CoV-2 by FDA under an Emergency Use Authorization (EUA).  This EUA will remain in effect (meaning this test can be used) for the duration of the COVID-19 declaration under Section 564(b)(1) of the Act, 21 U.S.C. section 360bbb-3(b)(1), unless the authorization is terminated or revoked sooner.  Performed at [facility], [address] [place], [place] [address]      Labs: BNP (last 3 results) Recent Labs    03/18/20 1215  BNP 40.5   Basic Metabolic Panel: Recent Labs  Lab 03/18/20 1219 03/19/20 0230 03/20/20 0537 03/21/20 0539 03/22/20 0618  NA 127* 129* 130* 130* 133*  K 4.0 3.9 3.6 4.0 4.3  CL 97* 102 104 106 107  CO2 18* 19* 17* 17* 19*  GLUCOSE 118* 106* 85 120* 124*  BUN 46* 36* 33* 27* 24*  CREATININE 2.06* 1.78* 1.96* 1.40* 1.32*  CALCIUM 8.2* 7.5* 7.6* 7.9* 8.3*  MG  --   --  1.8 2.0 2.1   Liver Function Tests: Recent Labs  Lab 03/18/20 1219  AST 29  ALT 19  ALKPHOS 87  BILITOT 0.8  PROT 7.3  ALBUMIN 3.1*   No results for input(s): LIPASE, AMYLASE in the last 168 hours. No results for input(s): AMMONIA in the last 168 hours. CBC: Recent Labs  Lab 03/18/20 1219 03/18/20 1219 03/18/20 2221 03/19/20 0230 03/20/20 0537 03/21/20 0539 03/22/20 0618  WBC 12.6*  --   --  11.5* 7.1 6.8 5.8  NEUTROABS 11.0*  --   --   --  5.4 5.1 3.8  HGB 10.7*   < > 9.0* 9.0* 8.3* 8.8* 9.4*  HCT 32.1*   < > 28.0* 26.7* 25.5* 25.6* 27.8*  MCV 84.3  --   --  83.7 85.6 83.1 84.0  PLT 130*  --   --  125* 98* 98* 128*   < > = values in this interval not displayed.   Cardiac  Enzymes: No results for input(s): CKTOTAL, CKMB, CKMBINDEX, TROPONINI in the last 168 hours. BNP: Invalid input(s): POCBNP CBG: Recent Labs  Lab 03/21/20 1144 03/21/20 1658 03/21/20 2113 03/22/20 0811 03/22/20 1130  GLUCAP 144* 124* 110* 107* 161*   D-Dimer No results for input(s): DDIMER in the last 72 hours. Hgb A1c No results for input(s): HGBA1C in the last 72 hours. Lipid Profile No results for input(s): CHOL, HDL, LDLCALC, TRIG, CHOLHDL, LDLDIRECT in the last 72 hours. Thyroid function studies No results for input(s): TSH, T4TOTAL, T3FREE, THYROIDAB in the last 72 hours.  Invalid input(s): FREET3 Anemia work up No results for input(s): VITAMINB12, FOLATE, FERRITIN, TIBC, IRON, RETICCTPCT in the last 72 hours. Urinalysis    Component Value Date/Time   COLORURINE YELLOW (A) 03/18/2020 1219   APPEARANCEUR HAZY (A) 03/18/2020 1219   LABSPEC 1.013 03/18/2020 1219   PHURINE 9.0 (H) 03/18/2020 1219   GLUCOSEU NEGATIVE 03/18/2020 1219   HGBUR SMALL (A) 03/18/2020 1219   BILIRUBINUR NEGATIVE 03/18/2020 1219   KETONESUR NEGATIVE 03/18/2020 1219   PROTEINUR 30 (A) 03/18/2020 1219   NITRITE NEGATIVE 03/18/2020 1219   LEUKOCYTESUR LARGE (A) 03/18/2020 1219   Sepsis Labs Invalid input(s): PROCALCITONIN,  WBC,  LACTICIDVEN Microbiology Recent Results (from the past 240 hour(s))  Urine culture     Status: Abnormal   Collection Time: 03/18/20 12:19 PM   Specimen: Urine, Random  Result Value Ref Range Status   Specimen Description   Final    URINE, RANDOM Performed at [facility], [address] [place], [place] [address]    Special Requests   Final    NONE Performed at [facility], [address] [place], [place] [address]    Culture >=100,000 COLONIES/mL PROTEUS MIRABILIS (A)  Final   Report Status 03/20/2020 FINAL  Final   Organism ID, Bacteria PROTEUS MIRABILIS (A)  Final      Susceptibility   Proteus mirabilis - MIC*    AMPICILLIN <=2  SENSITIVE Sensitive     CEFAZOLIN <=4 SENSITIVE Sensitive     CEFTRIAXONE <=0.25 SENSITIVE Sensitive     CIPROFLOXACIN <=0.25 SENSITIVE Sensitive     GENTAMICIN <=1 SENSITIVE Sensitive     IMIPENEM 2 SENSITIVE Sensitive     NITROFURANTOIN 128 RESISTANT Resistant     TRIMETH/SULFA <=20 SENSITIVE Sensitive     AMPICILLIN/SULBACTAM <=2 SENSITIVE Sensitive     PIP/TAZO <=4 SENSITIVE Sensitive     * >=100,000 COLONIES/mL PROTEUS MIRABILIS  Blood Culture (routine x 2)     Status: None (Preliminary result)   Collection Time: 03/18/20 12:19 PM   Specimen: BLOOD  Result Value Ref Range Status   Specimen Description BLOOD LEFT AC  Final   Special Requests   Final    BOTTLES DRAWN AEROBIC AND ANAEROBIC Blood Culture adequate volume   Culture   Final    NO GROWTH 4 DAYS Performed at [facility], [address] [place], [place] [address]    Report Status PENDING  Incomplete  Blood Culture (routine x 2)     Status: None (Preliminary result)   Collection Time: 03/18/20 12:19 PM   Specimen: BLOOD  Result Value Ref Range Status   Specimen Description BLOOD RIGHT [facility]  Final   Special Requests  Final    BOTTLES DRAWN AEROBIC AND ANAEROBIC Blood Culture adequate volume   Culture   Final    NO GROWTH 4 DAYS Performed at [facility], [address] [place], [place] [address]    Report Status PENDING  Incomplete  SARS Coronavirus 2 by RT PCR (hospital order, performed in [facility] hospital lab) Nasopharyngeal Nasopharyngeal Swab     Status: None   Collection Time: 03/18/20 12:22 PM   Specimen: Nasopharyngeal Swab  Result Value Ref Range Status   SARS Coronavirus 2 NEGATIVE NEGATIVE Final    Comment: (NOTE) SARS-CoV-2 target nucleic acids are NOT DETECTED.  The SARS-CoV-2 RNA is generally detectable in upper and lower respiratory specimens during the acute phase of infection. The lowest concentration of SARS-CoV-2 viral copies this assay can detect is 250 copies / mL.  A negative result does not preclude SARS-CoV-2 infection and should not be used as the sole basis for treatment or other patient management decisions.  A negative result may occur with improper specimen collection / handling, submission of specimen other than nasopharyngeal swab, presence of viral mutation(s) within the areas targeted by this assay, and inadequate number of viral copies (<250 copies / mL). A negative result must be combined with clinical observations, patient history, and epidemiological information.  Fact Sheet for Patients:   BoilerBrush.com.cy  Fact Sheet for Healthcare Providers: https://pope.com/  This test is not yet approved or  cleared by the [place] FDA and has been authorized for detection and/or diagnosis of SARS-CoV-2 by FDA under an Emergency Use Authorization (EUA).  This EUA will remain in effect (meaning this test can be used) for the duration of the COVID-19 declaration under Section 564(b)(1) of the Act, 21 U.S.C. section 360bbb-3(b)(1), unless the authorization is terminated or revoked sooner.  Performed at [facility], [address] [place], [place] [address]     Time coordinating discharge: Over 30 minutes  SIGNED:  Arnetha Courser, MD  [facility] 03/22/2020, 4:32 PM  If 7PM-7AM, please contact night-coverage [email or web address]  This record has been created using Conservation officer, historic buildings. Errors have been sought and corrected,but may not always be located. Such creation errors do not reflect on the standard of care.

## 2020-03-22 NOTE — Progress Notes (Signed)
Physical Therapy Treatment Patient Details Name: [name] MRN: [id number] DOB: 1956/01/31 Today's Date: 03/22/2020    History of Present Illness Pt is a 64 y.o. 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 Per MD impression, pt currently presents with sepsis due to UTI and cellulitis of sacral regio, elevated troponin likely due to demand ischemia, and acute metabolic encephalopathy.    PT Comments    Pt in bed at entry, agreeable to participate. Supervision for supine to sitting EOB. MaxA to rise from sitting c RW. Pt AMB 33ft, heavy effort, and audible increased WOB. Pt needs intermittent modA to facilitate limb progression in gait due to heavy crossover and heavy foot drop. AMB does not appear safe at this time, author recommended pt to use his bilat AFO for household AMB at DC. Author spoke to pt's wife, explained that pt should not perform ANY AMB at DC should they decide to decline STR. Wife reports to have a WC that patient can use. Pt reports she is able to physically assist patient.     Follow Up Recommendations  SNF;Supervision for mobility/OOB Pt/wife are in favor of direct DC back to home, decline SNF. Pt has adequate DME to safely do this, but rehab frequency is less than ideal.    Equipment Recommendations  None recommended by PT    Recommendations for Other Services       Precautions / Restrictions Precautions Precautions: Fall Precaution Comments: Rt heel ulcers, Sacral ulcer    Mobility  Bed Mobility Overal bed mobility: Modified Independent;Needs Assistance Bed Mobility: Supine to Sit     Supine to sit: Supervision     General bed mobility comments: heavy effort, but no assist needed  Transfers Overall transfer level: Needs assistance Equipment used: Rolling walker (2 wheeled) Transfers: Sit to/from Stand Sit to  Stand: Max assist         General transfer comment: remains weak, heavy assist to rise to standing  Ambulation/Gait   Gait Distance (Feet): 18 Feet Assistive device: Rolling walker (2 wheeled)       General Gait Details: incoordianted movements of RW, high effort, high veloicty thrust of RW, often pushing fwd whilst leaning Left elbow on RW; heavy bilat crossover, which when combined with heavy baseline foot drop, has substantial difficulty progressing legs safely, frqeuently trippin gon his own feet/socks.   Stairs             Wheelchair Mobility    Modified Rankin (Stroke Patients Only)       Balance Overall balance assessment: Needs assistance Sitting-balance support: No upper extremity supported Sitting balance-Leahy Scale: Good     Standing balance support: Bilateral upper extremity supported Standing balance-Leahy Scale: Poor                              Cognition Arousal/Alertness: Awake/alert Behavior During Therapy: WFL for tasks assessed/performed Overall Cognitive Status: Within Functional Limits for tasks assessed                                        Exercises      General Comments        Pertinent Vitals/Pain Pain Assessment: No/denies pain    Home Living  Prior Function            PT Goals (current goals can now be found in the care plan section) Acute Rehab PT Goals Patient Stated Goal: regain strength PT Goal Formulation: With patient Time For Goal Achievement: 04/04/20 Potential to Achieve Goals: Good Progress towards PT goals: Progressing toward goals    Frequency    Min 2X/week      PT Plan Current plan remains appropriate    Co-evaluation              AM-PAC PT "6 Clicks" Mobility   Outcome Measure  Help needed turning from your back to your side while in a flat bed without using bedrails?: A Little Help needed moving from lying on your back to  sitting on the side of a flat bed without using bedrails?: A Little Help needed moving to and from a bed to a chair (including a wheelchair)?: A Lot Help needed standing up from a chair using your arms (e.g., wheelchair or bedside chair)?: A Lot Help needed to walk in hospital room?: Total Help needed climbing 3-5 steps with a railing? : Total 6 Click Score: 12    End of Session Equipment Utilized During Treatment: Gait belt Activity Tolerance: Patient limited by fatigue Patient left: with call bell/phone within reach;in chair;with chair alarm set Nurse Communication: Mobility status PT Visit Diagnosis: Muscle weakness (generalized) (M62.81);Other abnormalities of gait and mobility (R26.89);Difficulty in walking, not elsewhere classified (R26.2)     Time: 7322-0254 PT Time Calculation (min) (ACUTE ONLY): 23 min  Charges:  $Gait Training: 8-22 mins $Therapeutic Exercise: 8-22 mins                     12:36 PM, 03/22/20 Rosamaria Lints, PT, DPT Physical Therapist - [facility]  [phone] (ASCOM)     Josef Tourigny C 03/22/2020, 12:32 PM

## 2020-03-22 NOTE — TOC Transition Note (Signed)
Transition of Care [facility]) - CM/SW Discharge Note   Patient Details  Name: [name] MRN: [id number] Date of Birth: 1955/10/17  Transition of Care [facility]) CM/SW Contact:  Shawn Route, RN Phone Number: 03/22/2020, 12:15 PM   Clinical Narrative:     Patient to discharge home with wife.  Patient's wife reports DME already in the home.  HH PT and RN will be provided by [facility]      Final next level of care: Home w Home Health Services Barriers to Discharge: Barriers Resolved   Patient Goals and CMS Choice Patient states their goals for this hospitalization and ongoing recovery are:: to get better   Choice offered to / list presented to : Patient  Discharge Placement                       Discharge Plan and Services In-house Referral: Clinical Social Work   Post Acute Care Choice: Skilled Nursing Facility                    HH Arranged: RN, PT [facility] Agency: [facility] Date [facility] Agency Contacted: 03/22/20 Time HH Agency Contacted: [phone] Representative spoke with at [facility] Agency: [place]  Social Determinants of Health (SDOH) Interventions     Readmission Risk Interventions No flowsheet data found.

## 2020-03-23 LAB — CULTURE, BLOOD (ROUTINE X 2)
Culture: NO GROWTH
Culture: NO GROWTH
Special Requests: ADEQUATE
Special Requests: ADEQUATE

## 2020-03-28 ENCOUNTER — Ambulatory Visit: Payer: Medicare Other | Admitting: Licensed Clinical Social Worker

## 2020-03-30 ENCOUNTER — Telehealth: Payer: Medicare Other | Admitting: Psychiatry

## 2020-04-04 ENCOUNTER — Other Ambulatory Visit: Payer: Self-pay

## 2020-04-04 ENCOUNTER — Ambulatory Visit: Payer: Medicare Other | Admitting: Physician Assistant

## 2020-04-04 ENCOUNTER — Ambulatory Visit (INDEPENDENT_AMBULATORY_CARE_PROVIDER_SITE_OTHER): Payer: Medicare Other | Admitting: Physician Assistant

## 2020-04-04 DIAGNOSIS — N35919 Unspecified urethral stricture, male, unspecified site: Secondary | ICD-10-CM

## 2020-04-04 NOTE — Progress Notes (Signed)
04/04/2020 11:20 AM   [name] 03-20-1956 [id number]  CC: Chief Complaint  Patient presents with  . Other    HPI: [name] is a 64 y.o. male with neurogenic bladder managed by CIC who was recently hospitalized from 03/20/2020 to 03/22/2020 for management of urinary retention and sepsis.  He reported difficulty self cathing at the time of admission and ultimately required Foley placement over guidewire by Dr. Annabell Howells for management of a bulbar urethral stricture.  He presents today for outpatient follow-up and voiding trial.  He is accompanied today by his wife, who contributes to HPI.  Today he reports tolerating his catheter well.  He remains physically deconditioned following his recent hospitalization and states he does not feel physically prepared to resume CIC.  He would prefer to keep his Foley catheter for the time being pending increased mobility.  PMH: Past Medical History:  Diagnosis Date  . Bipolar disorder (HCC)   . Diabetes mellitus without complication (HCC)   . History of blood clots   . Hypertension   . TBI (traumatic brain injury) [facility])     Surgical History: Past Surgical History:  Procedure Laterality Date  . BACK SURGERY    . CARPAL TUNNEL RELEASE Bilateral   . COLON SURGERY    . COLOSTOMY    . TONSILLECTOMY      Home Medications:  Allergies as of 04/04/2020   No Known Allergies     Medication List       Accurate as of April 04, 2020 11:20 AM. If you have any questions, ask your nurse or doctor.        Eliquis 5 MG Tabs tablet Generic drug: apixaban Take 5 mg by mouth 2 (two) times daily.   glipiZIDE 10 MG 24 hr tablet Commonly known as: GLUCOTROL XL Take 10 mg by mouth daily with breakfast.   memantine 10 MG tablet Commonly known as: NAMENDA Take 10 mg by mouth 2 (two) times daily.   mirabegron ER 50 MG Tb24 tablet Commonly known as: MYRBETRIQ Take 1 tablet (50 mg total) by mouth daily.     Olmesartan-amLODIPine-HCTZ 20-5-12.5 MG Tabs Take 1 tablet by mouth daily.   Oxcarbazepine 300 MG tablet Commonly known as: TRILEPTAL Take 1 tablet (300 mg total) by mouth 3 (three) times daily.   QUEtiapine 50 MG tablet Commonly known as: SEROquel Take 1 tablet (50 mg total) by mouth 2 (two) times daily as needed. For severe anxiety and agitation   QUEtiapine 100 MG tablet Commonly known as: SEROQUEL TAKE 1 TABLET BY MOUTH AT BEDTIME   rosuvastatin 20 MG tablet Commonly known as: CRESTOR Take 20 mg by mouth daily.   vitamin B-12 1000 MCG tablet Commonly known as: CYANOCOBALAMIN Take 3,000 mcg by mouth daily.       Allergies:  No Known Allergies  Family History: Family History  Problem Relation Age of Onset  . Depression Father     Social History:   reports that he has never smoked. He has never used smokeless tobacco. He reports current alcohol use. He reports that he does not use drugs.  Physical Exam: There were no vitals taken for this visit.  Constitutional:  Alert and oriented, no acute distress, nontoxic appearing HEENT: [place], AT Cardiovascular: No clubbing, cyanosis, or edema Respiratory: Normal respiratory effort, no increased work of breathing Skin: No rashes, bruises or suspicious lesions Neurologic: Grossly intact, no focal deficits, moving all 4 extremities Psychiatric: Normal mood and affect  Assessment & Plan:  1. Stricture of male urethra, unspecified stricture type I had a lengthy conversation with the patient and his wife today.  I explained that chronic indwelling Foley catheterization increases his risk for urinary infection, however I am in agreement with continued catheterization if he remains too deconditioned at this time to resume his 4 times daily self-catheterization schedule.  Ultimately, we agreed to continue Foley catheter at this time.  I exchanged the patient's Foley catheter in clinic today, see separate procedure note for details.   We will plan for reassessment in 1 month with Foley catheter exchange versus removal and resumed CIC.  Patient and wife expressed understanding.  Return in about 4 weeks (around 05/02/2020) for Catheter exchange vs. removal.  [name], [facility]  [facility] [address] [place], [place] [address] [phone]

## 2020-04-04 NOTE — Progress Notes (Signed)
Cath Change/ Replacement  Patient is present today for a catheter change due to urinary retention.  53ml of water was removed from the balloon, a 16FR council tip foley cath was removed without difficulty.  Patient was cleaned and prepped in a sterile fashion with betadine and 2% lidocaine jelly was instilled into the urethra. A 16 FR foley cath was replaced into the bladder no complications were noted Urine return was noted 20ml and urine was yellow in color. The balloon was filled with 79ml of sterile water. A leg bag was attached for drainage.  A night bag was also given to the patient and patient was given instruction on how to change from one bag to another. Patient was given proper instruction on catheter care.    Performed by: [name], PA-C

## 2020-04-13 DIAGNOSIS — M4628 Osteomyelitis of vertebra, sacral and sacrococcygeal region: Secondary | ICD-10-CM | POA: Insufficient documentation

## 2020-05-03 ENCOUNTER — Other Ambulatory Visit: Payer: Self-pay

## 2020-05-03 ENCOUNTER — Encounter: Payer: Self-pay | Admitting: Psychiatry

## 2020-05-03 ENCOUNTER — Telehealth (INDEPENDENT_AMBULATORY_CARE_PROVIDER_SITE_OTHER): Payer: Medicare Other | Admitting: Psychiatry

## 2020-05-03 DIAGNOSIS — G4701 Insomnia due to medical condition: Secondary | ICD-10-CM | POA: Diagnosis not present

## 2020-05-03 DIAGNOSIS — F3178 Bipolar disorder, in full remission, most recent episode mixed: Secondary | ICD-10-CM

## 2020-05-03 DIAGNOSIS — F411 Generalized anxiety disorder: Secondary | ICD-10-CM | POA: Diagnosis not present

## 2020-05-03 MED ORDER — OXCARBAZEPINE 300 MG PO TABS: 300.0000 mg | ORAL_TABLET | Freq: Three times a day (TID) | ORAL | 1 refills | Status: DC

## 2020-05-03 MED ORDER — QUETIAPINE FUMARATE 100 MG PO TABS: 100.0000 mg | ORAL_TABLET | Freq: Every day | ORAL | 1 refills | Status: DC

## 2020-05-03 MED ORDER — QUETIAPINE FUMARATE 50 MG PO TABS: 50.0000 mg | ORAL_TABLET | Freq: Two times a day (BID) | ORAL | 1 refills | Status: DC | PRN

## 2020-05-03 NOTE — Progress Notes (Signed)
Virtual Visit via Video Note  I connected with [name] on 05/03/20 at  2:20 PM EDT by a video enabled telemedicine application and verified that I am speaking with the correct person using two identifiers. Location Provider Location : [facility] Patient Location : [place]  Participants: Patient ,Spouse, Provider   I discussed the limitations of evaluation and management by telemedicine and the availability of in person appointments. The patient expressed understanding and agreed to proceed.    I discussed the assessment and treatment plan with the patient. The patient was provided an opportunity to ask questions and all were answered. The patient agreed with the plan and demonstrated an understanding of the instructions.   The patient was advised to call back or seek an in-person evaluation if the symptoms worsen or if the condition fails to improve as anticipated.   BH MD OP Progress Note  05/04/2020 8:30 AM [name]  MRN:  [id number]  Chief Complaint:  Chief Complaint    Follow-up     HPI: [name] is a 63 year old African-American male, lives in [place] has a history of bipolar disorder in full remission, GAD, insomnia, history of DVT, hyperlipidemia, diabetes melitis, cognitive impairment likely mild was evaluated by telemedicine today.  Patient being a limited historian majority of information was provided by wife-[name] who was present in session today.  Per wife patient is currently doing well.  He however had multiple hospitalization recently for sepsis and other problems. Patient continues to be under the care of providers for the same.  He had a prolonged hospital stay and was recently discharged from [facility].  However wife reports he is currently doing well with regards to his mood.  He is very calm and does not appear to be too anxious or agitated or depressed.  He is sleeping well.  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  He was recently started on memantine per neurology.  He has follow-up session scheduled with neurology.  Patient appeared to be alert, oriented to person place time and situation.  He denied any suicidality, homicidality or perceptual disturbances.  He is compliant on the Trileptal and the Seroquel.  Patient denies any other concerns today.  Visit Diagnosis:    ICD-10-CM   1. Bipolar disorder, in full remission, most recent episode mixed (HCC)  F31.78 Oxcarbazepine (TRILEPTAL) 300 MG tablet    QUEtiapine (SEROQUEL) 100 MG tablet    QUEtiapine (SEROQUEL) 50 MG tablet  2. GAD (generalized anxiety disorder)  F41.1 QUEtiapine (SEROQUEL) 50 MG tablet  3. Insomnia due to medical condition  G47.01 QUEtiapine (SEROQUEL) 50 MG tablet    Past Psychiatric History: I have reviewed past psychiatric history from my progress note on 09/09/2017.  Past Medical History:  Past Medical History:  Diagnosis Date  . Bipolar disorder (HCC)   . Diabetes mellitus without complication (HCC)   . History of blood clots   . Hypertension   . TBI (traumatic brain injury) [facility])     Past Surgical History:  Procedure Laterality Date  . BACK SURGERY    . CARPAL TUNNEL RELEASE Bilateral   . COLON SURGERY    . COLOSTOMY    . TONSILLECTOMY      Family Psychiatric History: I have reviewed family psychiatric history from my progress note on 09/09/2017.  Family History:  Family History  Problem Relation Age of Onset  . Depression  Father     Social History: I have reviewed social history from my progress note on 09/09/2017. Social History   Socioeconomic History  . Marital status: Married    Spouse name: [name]  . Number of children: 0  . Years of education: Not on file  . Highest education level: Associate degree: occupational, Scientist, product/process development, or vocational program  Occupational History  . Not on file  Tobacco  Use  . Smoking status: Never Smoker  . Smokeless tobacco: Never Used  Vaping Use  . Vaping Use: Never used  Substance and Sexual Activity  . Alcohol use: Yes    Comment: social  . Drug use: No  . Sexual activity: Not on file  Other Topics Concern  . Not on file  Social History Narrative  . Not on file   Social Determinants of Health   Financial Resource Strain:   . Difficulty of Paying Living Expenses: Not on file  Food Insecurity:   . Worried About Programme researcher, broadcasting/film/video in the Last Year: Not on file  . Ran Out of Food in the Last Year: Not on file  Transportation Needs:   . Lack of Transportation (Medical): Not on file  . Lack of Transportation (Non-Medical): Not on file  Physical Activity:   . Days of Exercise per Week: Not on file  . Minutes of Exercise per Session: Not on file  Stress:   . Feeling of Stress : Not on file  Social Connections:   . Frequency of Communication with Friends and Family: Not on file  . Frequency of Social Gatherings with Friends and Family: Not on file  . Attends Religious Services: Not on file  . Active Member of Clubs or Organizations: Not on file  . Attends Banker Meetings: Not on file  . Marital Status: Not on file    Allergies: No Known Allergies  Metabolic Disorder Labs: Lab Results  Component Value Date   HGBA1C 6.3 (H) 03/18/2020   MPG 134.11 03/18/2020   No results found for: PROLACTIN No results found for: CHOL, TRIG, HDL, CHOLHDL, VLDL, LDLCALC No results found for: TSH  Therapeutic Level Labs: Lab Results  Component Value Date   LITHIUM 1.24 (H) 01/04/2016   LITHIUM 1.51 (HH) 01/03/2016   No results found for: VALPROATE No components found for:  CBMZ  Current Medications: Current Outpatient Medications  Medication Sig Dispense Refill  . ceFEPIme (MAXIPIME) 2 g injection SMARTSIG:2 Gram(s) IV Twice Daily    . ELIQUIS 5 MG TABS tablet Take 5 mg by mouth 2 (two) times daily.     Marland Kitchen glipiZIDE (GLUCOTROL  XL) 10 MG 24 hr tablet Take 10 mg by mouth daily with breakfast.     . memantine (NAMENDA) 10 MG tablet Take 10 mg by mouth 2 (two) times daily.    . metroNIDAZOLE (FLAGYL) 500 MG tablet Take 500 mg by mouth 3 (three) times daily.    . Olmesartan-amLODIPine-HCTZ 20-5-12.5 MG TABS Take 1 tablet by mouth daily.    Marland Kitchen olmesartan-hydrochlorothiazide (BENICAR HCT) 20-12.5 MG tablet Take 1 tablet by mouth daily.    . Omega-3 1000 MG CAPS Take by mouth.    . Oxcarbazepine (TRILEPTAL) 300 MG tablet Take 1 tablet (300 mg total) by mouth 3 (three) times daily. 270 tablet 1  . QUEtiapine (SEROQUEL) 100 MG tablet Take 1 tablet (100 mg total) by mouth at bedtime. 90 tablet 1  . QUEtiapine (SEROQUEL) 50 MG tablet Take 1 tablet (50 mg total) by  mouth 2 (two) times daily as needed. For severe anxiety and agitation 180 tablet 1  . rosuvastatin (CRESTOR) 20 MG tablet Take 20 mg by mouth daily.     . vitamin B-12 (CYANOCOBALAMIN) 1000 MCG tablet Take 3,000 mcg by mouth daily.     No current facility-administered medications for this visit.     Musculoskeletal: Strength & Muscle Tone: UTA Gait & Station: Wheelchair bound Patient leans: N/A  Psychiatric Specialty Exam: Review of Systems  Psychiatric/Behavioral: Negative for behavioral problems, confusion, decreased concentration, dysphoric mood, hallucinations, self-injury, sleep disturbance and suicidal ideas. The patient is not nervous/anxious and is not hyperactive.   All other systems reviewed and are negative.   There were no vitals taken for this visit.There is no height or weight on file to calculate BMI.  General Appearance: Casual  Eye Contact:  Fair  Speech:  Slow Limited  Volume:  Normal  Mood:  Euthymic  Affect:  Congruent  Thought Process:  Goal Directed and Descriptions of Associations: Intact  Orientation:  Full (Time, Place, and Person)  Thought Content: Logical   Suicidal Thoughts:  No  Homicidal Thoughts:  No  Memory:  Immediate;    Fair Recent;   limited Remote;   limited  Judgement:  Fair  Insight:  Fair  Psychomotor Activity:  Normal  Concentration:  Concentration: Fair and Attention Span: Fair  Recall:  [employer] of Knowledge: Fair  Language: Fair  Akathisia:  No  Handed:  Right  AIMS (if indicated): UTA  Assets:  Housing Social Support  ADL's:  Intact  Cognition: Impaired,  Mild  Sleep:  Fair   Screenings: AIMS     [facility]/10/2018 in [facility]  AIMS Total Score 0       Assessment and Plan: Dedric Ethington is a 64 year old African-American male who has a history of bipolar disorder was evaluated by telemedicine today.  Patient with recent medical problems, recent hospitalization for sepsis.  Patient however is currently making progress on the current medication regimen.  Plan as noted below.  Plan Bipolar disorder in remission Trileptal 300 mg p.o. 3 times daily Seroquel 100 mg p.o. nightly and Seroquel 50 mg p.o. twice daily as needed for severe anxiety symptoms.  Insomnia-stable Seroquel 100 mg p.o. nightly  GAD-stable Seroquel as prescribed.   Collateral information was obtained from wife [name] as summarized above.  Patient to sign a release of information to request medical records from neurology.  Also reviewed most recent labs from [facility]-dated 04/21/2020-CBC platelet-within normal limits, CMP-sodium-133 - improved.  Follow-up in clinic in 3 to 4 weeks or sooner if needed.  I have spent atleast 20 minutes face to face by video with patient today. More than 50 % of the time was spent for preparing to see the patient ( e.g., review of test, records ), obtaining and to review and separately obtained history , ordering medications and test ,psychoeducation and supportive psychotherapy and care coordination,as well as documenting clinical information in electronic health record,interpreting and communication of test results This note was  generated in part or whole with voice recognition software. Voice recognition is usually quite accurate but there are transcription errors that can and very often do occur. I apologize for any typographical errors that were not detected and corrected.        [name], MD 05/04/2020, 8:30 AM

## 2020-05-08 ENCOUNTER — Encounter: Payer: Self-pay | Admitting: Physician Assistant

## 2020-05-08 ENCOUNTER — Ambulatory Visit (INDEPENDENT_AMBULATORY_CARE_PROVIDER_SITE_OTHER): Payer: Medicare Other | Admitting: Physician Assistant

## 2020-05-08 ENCOUNTER — Other Ambulatory Visit: Payer: Self-pay

## 2020-05-08 VITALS — BP 127/69 | HR 88 | Ht 66.0 in | Wt 238.0 lb

## 2020-05-08 DIAGNOSIS — N319 Neuromuscular dysfunction of bladder, unspecified: Secondary | ICD-10-CM | POA: Diagnosis not present

## 2020-05-08 NOTE — Progress Notes (Signed)
Cath Change/ Replacement  Patient is present today for a catheter change due to urinary retention.  64ml of water was removed from the balloon, a 16FR foley cath was removed without difficulty.  Patient was cleaned and prepped in a sterile fashion with betadine and 2% lidocaine jelly was instilled into the urethra. A 16 FR foley cath was replaced into the bladder no complications were noted Urine return was noted 85ml and urine was yellow in color. The balloon was filled with 15ml of sterile water. A leg bag was attached for drainage.  A night bag was also given to the patient and patient was given instruction on how to change from one bag to another. Patient was given proper instruction on catheter care.    Performed by: [name], PA-C   Additional notes: Patient remains physically deconditioned following his recent hospitalization and remains minimally ambulatory.  He prefers to keep Foley catheter in place and states he still does not feel up to resuming self-catheterization.  Additionally, he wonders if he may initiate monthly catheter exchanges with home health nursing services already in place.  I am in agreement with this plan.  Letter provided today instructing home health nursing to exchange his Foley catheter monthly.  I will schedule him for a 95-month follow-up with me for Foley removal and to resume CIC.  I instructed the patient to contact our office to reschedule this appointment sooner versus later per his needs and the pace of his recovery.  He expressed understanding.  Follow up: Return in about 3 months (around 08/08/2020) for Foley removal and resume CIC, sooner or later if needed.

## 2020-05-08 NOTE — Patient Instructions (Signed)
START at-home monthly catheter exchanges with home health. Next catheter change due 06/07/2020.  I have scheduled you for a 40-month follow up appointment with me, at which time we will remove your catheter and have you restart self-catheterization. The timing of this appointment can change based on how you are doing with physical therapy. Please call our office to reschedule it sooner or later depending on your needs.

## 2020-06-01 ENCOUNTER — Telehealth (INDEPENDENT_AMBULATORY_CARE_PROVIDER_SITE_OTHER): Payer: Medicare Other | Admitting: Psychiatry

## 2020-06-01 ENCOUNTER — Other Ambulatory Visit: Payer: Self-pay

## 2020-06-01 ENCOUNTER — Encounter: Payer: Self-pay | Admitting: Psychiatry

## 2020-06-01 DIAGNOSIS — F0390 Unspecified dementia without behavioral disturbance: Secondary | ICD-10-CM

## 2020-06-01 DIAGNOSIS — F411 Generalized anxiety disorder: Secondary | ICD-10-CM | POA: Diagnosis not present

## 2020-06-01 DIAGNOSIS — F3178 Bipolar disorder, in full remission, most recent episode mixed: Secondary | ICD-10-CM

## 2020-06-01 DIAGNOSIS — G4701 Insomnia due to medical condition: Secondary | ICD-10-CM
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
## 2020-06-15 ENCOUNTER — Telehealth: Payer: Self-pay | Admitting: *Deleted

## 2020-06-15 NOTE — Telephone Encounter (Signed)
Received call from Home health nurse Marcelle Smiling regarding white discharge seen on penis. Denies fevers, chills, body aches, nausea, vomiting or difficulty with urine draining. Advised per [name], PA continue clean catheter care. Clean site with soap and rinse with water, if symptoms worsen call the office. Voiced understanding.

## 2020-06-27 ENCOUNTER — Telehealth: Payer: Self-pay | Admitting: *Deleted

## 2020-06-27 NOTE — Telephone Encounter (Signed)
Patient's wife called concerned of ongoing discharge at tip of penis. Had foley changed recently still has not improved. Advised of good hygiene and catheter care. Denies any other symptoms. Appointment scheduled for evaluation.

## 2020-06-28 ENCOUNTER — Encounter: Payer: Self-pay | Admitting: Physician Assistant

## 2020-06-28 ENCOUNTER — Other Ambulatory Visit: Payer: Self-pay

## 2020-06-28 ENCOUNTER — Ambulatory Visit (INDEPENDENT_AMBULATORY_CARE_PROVIDER_SITE_OTHER): Payer: Medicare Other | Admitting: Physician Assistant

## 2020-06-28 VITALS — BP 110/70 | HR 106 | Ht 69.0 in

## 2020-06-28 DIAGNOSIS — T839XXA Unspecified complication of genitourinary prosthetic device, implant and graft, initial encounter: Secondary | ICD-10-CM

## 2020-06-28 NOTE — Patient Instructions (Signed)
Please start drinking more water to help dilute your urine and decrease urinary sediment. I am providing you with a letter for home health today to switch you to silicone Foley catheters as tolerated, as these will be harder to block up. I would like you to continue with monthly Foley catheter exchanges with home health, sooner as needed if your catheters continue to get blocked before their scheduled change.  If your catheters continue to get clogged earlier than every 4 weeks after increasing your fluid intake and switching to silicone catheters, please start at-home vinegar instillations in your bladder as follows.  Vinegar Bladder Irrigation Protocol  Please start irrigating your bladder with a vinegar solution as described below to reduce urinary encrustation.  Most grocery stores carry white vinegar as a 5% solution. To make an appropriate bladder irrigation solution, you will need to dilute this at a ratio of roughly 20:1.  To achieve this, see the chart below to determine what amount of 5% white vinegar solution you should mix with your normal bladder irrigation (homemade saline or sterile saline from the pharmacy).  If you are starting with a saline volume of... ... , then add 2.5 tsp (12.40mL) of white vinegar .Marland KitchenMarland Kitchen , then add 5 tsp (65mL) of white vinegar .Marland Kitchen. , then add 10 tsp (20mL) of white vinegar ...1 gallon, then add about 6 oz of white vinegar  Instill 45mL of the vinegar solution through your catheter into the bladder up to three times daily. Leave the solution in the bladder for 10 minutes each time, then drain. Discontinue irrigation if it causes pain or discomfort. You may store any leftover solution in the refrigerator for up to 1 week.
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
## 2020-07-03 ENCOUNTER — Telehealth: Payer: Self-pay | Admitting: Physician Assistant

## 2020-07-04 ENCOUNTER — Ambulatory Visit: Payer: Medicare Other | Admitting: Physician Assistant

## 2020-07-06 ENCOUNTER — Ambulatory Visit: Payer: Medicare Other | Admitting: Physician Assistant

## 2020-07-07 ENCOUNTER — Encounter: Payer: Self-pay | Admitting: Physician Assistant

## 2020-07-07 DIAGNOSIS — D638 Anemia in other chronic diseases classified elsewhere: Secondary | ICD-10-CM | POA: Insufficient documentation

## 2020-07-07 DIAGNOSIS — E871 Hypo-osmolality and hyponatremia: Secondary | ICD-10-CM | POA: Insufficient documentation

## 2020-07-07 DIAGNOSIS — L97409 Non-pressure chronic ulcer of unspecified heel and midfoot with unspecified severity: Secondary | ICD-10-CM | POA: Insufficient documentation

## 2020-07-07 DIAGNOSIS — N179 Acute kidney failure, unspecified: Secondary | ICD-10-CM | POA: Insufficient documentation

## 2020-07-07 DIAGNOSIS — G9340 Encephalopathy, unspecified: Secondary | ICD-10-CM | POA: Insufficient documentation

## 2020-07-07 DIAGNOSIS — D649 Anemia, unspecified: Secondary | ICD-10-CM | POA: Insufficient documentation

## 2020-07-07 DIAGNOSIS — M86371 Chronic multifocal osteomyelitis, right ankle and foot: Secondary | ICD-10-CM | POA: Insufficient documentation

## 2020-08-07 ENCOUNTER — Ambulatory Visit: Payer: Self-pay | Admitting: Physician Assistant

## 2020-08-09 ENCOUNTER — Other Ambulatory Visit: Payer: Self-pay

## 2020-08-09 ENCOUNTER — Ambulatory Visit: Payer: Medicare Other | Admitting: Physician Assistant

## 2020-08-09 VITALS — BP 143/84 | HR 101 | Ht 66.0 in

## 2020-08-09 DIAGNOSIS — N319 Neuromuscular dysfunction of bladder, unspecified: Secondary | ICD-10-CM | POA: Diagnosis not present

## 2020-08-09 DIAGNOSIS — N481 Balanitis: Secondary | ICD-10-CM | POA: Diagnosis not present

## 2020-08-09 MED ORDER — CLOTRIMAZOLE 1 % EX CREA: TOPICAL_CREAM | CUTANEOUS | 1 refills | Status: DC

## 2020-08-09 NOTE — Patient Instructions (Signed)
Continue monthly catheter exchanges with home health. Return to clinic if and when you decide you'd like to discontinue your catheter and resume self-catheterization.

## 2020-08-09 NOTE — Progress Notes (Signed)
08/09/2020 4:56 PM   [name] 02-Oct-1955 [id number]  CC: Chief Complaint  Patient presents with  . Follow-up    HPI: [name] is a 65 y.o. male with neurogenic bladder managed by chronic indwelling Foley catheter exchanged by home health who presents today for scheduled appointment to remove his Foley catheter and resume CIC.  This appointment was scheduled 3 months ago in the setting of a recent hospitalization for urinary retention and sepsis, after which he was physically deconditioned and did not feel physically prepared to resume CIC.  He elected to keep a chronic indwelling Foley at that point.  He is accompanied today by his wife, who contributes to HPI.  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  He had no urinary complaints at that time, however urine culture was positive for mixed gram-negative organisms and he was diagnosed with a catheter associated UTI.  Today, patient remains physically deconditioned and wishes to continue chronic indwelling Foley catheter.  He is due for his monthly catheter exchange today but otherwise has been doing well with monthly exchanges with his home health nurse.  Additionally, patient's wife reports she has noted some edema of his penis.  He is circumcised.  Patient has been reporting some penile irritation.  PMH: Past Medical History:  Diagnosis Date  . Bipolar disorder (HCC)   . Diabetes mellitus without complication (HCC)   . History of blood clots   . Hypertension   . TBI (traumatic brain injury) [facility])     Surgical History: Past Surgical History:  Procedure Laterality Date  . BACK SURGERY    . CARPAL TUNNEL RELEASE Bilateral   . COLON SURGERY    . COLOSTOMY    . TONSILLECTOMY      Home Medications:   Allergies as of 08/09/2020   No Known Allergies     Medication List       Accurate as of August 09, 2020  4:56 PM. If you have any questions, ask your nurse or doctor.        ascorbic acid 250 MG tablet Commonly known as: VITAMIN C Take by mouth.   ceFEPIme 2 g injection Commonly known as: MAXIPIME   clotrimazole 1 % cream Commonly known as: LOTRIMIN Apply topically twice daily for 1 week. Started by: [name], PA-C   Eliquis 5 MG Tabs tablet Generic drug: apixaban Take 5 mg by mouth 2 (two) times daily.   glipiZIDE 10 MG 24 hr tablet Commonly known as: GLUCOTROL XL Take 10 mg by mouth daily with breakfast.   memantine 10 MG tablet Commonly known as: NAMENDA Take 10 mg by mouth 2 (two) times daily.   metroNIDAZOLE 500 MG tablet Commonly known as: FLAGYL Take 500 mg by mouth 3 (three) times daily.   Olmesartan-amLODIPine-HCTZ 20-5-12.5 MG Tabs Take 1 tablet by mouth daily.   olmesartan-hydrochlorothiazide 20-12.5 MG tablet Commonly known as: BENICAR HCT Take 1 tablet by mouth daily.   Omega-3 1000 MG Caps Take by mouth.   Oxcarbazepine 300 MG tablet Commonly known as: TRILEPTAL Take 1 tablet (300 mg total) by mouth 3 (three) times daily.   QUEtiapine 100 MG tablet Commonly known as: SEROQUEL Take 1 tablet (100 mg total) by mouth at bedtime.   QUEtiapine 50 MG tablet Commonly known as: SEROquel Take 1 tablet (50 mg total) by mouth 2 (two) times  daily as needed. For severe anxiety and agitation   rosuvastatin 20 MG tablet Commonly known as: CRESTOR Take 20 mg by mouth daily.   vitamin B-12 1000 MCG tablet Commonly known as: CYANOCOBALAMIN Take 3,000 mcg by mouth daily.       Allergies:  No Known Allergies  Family History: Family History  Problem Relation Age of Onset  . Depression Father     Social History:   reports that he has never smoked. He has never used smokeless tobacco. He reports current alcohol use. He reports  that he does not use drugs.  Physical Exam: BP (!) 143/84   Pulse (!) 101   Ht 5\' 6"  (1.676 m)   BMI 38.41 kg/m   Constitutional:  Alert, no acute distress, nontoxic appearing HEENT: [place], AT Cardiovascular: No clubbing, cyanosis, or edema Respiratory: Normal respiratory effort, no increased work of breathing GU: Mild edema of the distal penis with erythema.  No purulence, crepitus, or fluctuance noted. Skin: No rashes, bruises or suspicious lesions Neurologic: Requires heavy assist for ambulation and transfers Psychiatric: Normal mood and affect  Assessment & Plan:   1. Neurogenic bladder Patient remains physically deconditioned and requests to continue chronic indwelling Foley catheter.  I am in agreement with this plan.  I exchanged his Foley catheter in clinic today, see separate procedure note for details.  We will plan for home health to continue monthly exchanges moving forward.  Patient to follow-up in our clinic if and when he wishes to discontinue chronic indwelling Foley in lieu of self-catheterization.  Notably, patient was originally scheduled for follow-up with Dr. in 1 month.  Appointment was originally scheduled when he was self catheterizing.  Given chronic indwelling Foley, will plan to push this appointment out 6 months.  Will defer renal ultrasound at this time  2. Balanitis Noted on physical exam today.  Prescribing topical clotrimazole and counseled patient and wife to continue hygiene practices.  They expressed understanding. - clotrimazole (LOTRIMIN) 1 % cream; Apply topically twice daily for 1 week.  Dispense: 28 g; Refill: 1  Return in about 6 months (around 02/06/2021) for Routine follow-up with Dr. 04/08/2021.  Richardo Hanks, PA-C  [facility] [address] [place], [place] [phone]

## 2020-08-09 NOTE — Progress Notes (Signed)
Cath Change/ Replacement  Patient is present today for a catheter change due to urinary retention.  44ml of water was removed from the balloon, a 16FR silicone foley cath was removed with out difficulty.  Patient was cleaned and prepped in a sterile fashion with betadine and 2% lidocaine jelly was instilled into the urethra. A 16 FR foley cath was replaced into the bladder no complications were noted Urine return was noted 18ml and urine was yellow in color. The balloon was filled with 60ml of sterile water. A night bag was attached for drainage.  Patient tolerated well.    Performed by: [name], PA-C   Follow up: Return in about 6 months (around 02/06/2021) for Routine follow-up with Dr. Richardo Hanks.

## 2020-08-22 ENCOUNTER — Ambulatory Visit
Admission: RE | Admit: 2020-08-22 | Discharge: 2020-08-22 | Disposition: A | Discharge status: Home / Self Care | Payer: Medicare Other | Source: Ambulatory Visit | Attending: Urology | Admitting: Urology

## 2020-08-22 ENCOUNTER — Other Ambulatory Visit: Payer: Self-pay

## 2020-08-22 DIAGNOSIS — N319 Neuromuscular dysfunction of bladder, unspecified: Secondary | ICD-10-CM | POA: Diagnosis not present

## 2020-08-30 ENCOUNTER — Ambulatory Visit: Payer: Self-pay | Admitting: Urology

## 2020-09-06 ENCOUNTER — Telehealth (HOSPITAL_COMMUNITY): Payer: Self-pay | Admitting: Psychiatry

## 2020-09-06 ENCOUNTER — Telehealth (INDEPENDENT_AMBULATORY_CARE_PROVIDER_SITE_OTHER): Payer: Medicare Other | Admitting: Psychiatry

## 2020-09-06 ENCOUNTER — Other Ambulatory Visit: Payer: Self-pay

## 2020-09-06 ENCOUNTER — Encounter: Payer: Self-pay | Admitting: Psychiatry

## 2020-09-06 DIAGNOSIS — F3178 Bipolar disorder, in full remission, most recent episode mixed: Secondary | ICD-10-CM | POA: Diagnosis not present

## 2020-09-06 DIAGNOSIS — F0390 Unspecified dementia without behavioral disturbance: Secondary | ICD-10-CM

## 2020-09-06 DIAGNOSIS — G4701 Insomnia due to medical condition: Secondary | ICD-10-CM

## 2020-09-06 DIAGNOSIS — F411 Generalized anxiety disorder: Secondary | ICD-10-CM

## 2020-09-06 DIAGNOSIS — Z79899 Other long term (current) drug therapy: Secondary | ICD-10-CM

## 2020-09-06 NOTE — Progress Notes (Signed)
Virtual Visit via Video Note  I connected with [name] MaffucciJoseph [name] on 09/06/20 at  1:00 PM EST by a video enabled telemedicine application and verified that I am speaking with the correct person using two identifiers.  Location Provider Location : [facility] Patient Location : Home  Participants: Patient , Spouse,Provider   I discussed the limitations of evaluation and management by telemedicine and the availability of in person appointments. The patient expressed understanding and agreed to proceed.   I discussed the assessment and treatment plan with the patient. The patient was provided an opportunity to ask questions and all were answered. The patient agreed with the plan and demonstrated an understanding of the instructions.   The patient was advised to call back or seek an in-person evaluation if the symptoms worsen or if the condition fails to improve as anticipated.   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  Patient being a limited historian collateral information was obtained from wife.  According to wife patient had a recent admission to the medical unit due to dehydration as well as UTI.  Patient was stabilized and discharged .  Since his hospital admission he has regressed back to his previous level of anxiety.  He wants to cling onto his wife all the time.  He is fearful to be left alone.  If she stays with him he is fine.  She has has been taking him with her everywhere that she goes.  She gets her God daughter to help when she has to run errands.  Patient was discharged on the same psychotropic medications , none of the medications were changed  according to the wife.  Patient today reports he is ' fine'.  He was able to answer questions in short phrases.  He appeared to be alert and was able to tell me the date, the place, his date of birth correctly.  Patient reported that he does not believe he needs any medication changes today and wants to stay on what he is taking now.  He denies any side effects.  Patient as well as his wife agrees to be referred to IOP program.  Will refer for the same.  Hopefully being in a group situation will help him with his current anxiety.  Patient denies any other concerns today.  Visit Diagnosis:    ICD-10-CM   1. Bipolar disorder, in full remission, most recent episode mixed (HCC)  F31.78   2. GAD (generalized anxiety disorder)  F41.1   3. Insomnia due to medical condition  G47.01   4. 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 High risk medication use  Z79.899     Past Psychiatric History: I have reviewed past psychiatric history from my progress note on 09/09/2017  Past Medical History:  Past Medical History:  Diagnosis Date  . Bipolar disorder (HCC)   . Diabetes mellitus without complication (HCC)   . History of blood clots   . Hypertension   . TBI (traumatic brain injury) [facility](HCC)     Past Surgical History:  Procedure Laterality Date  . BACK SURGERY    . CARPAL TUNNEL RELEASE Bilateral   . COLON SURGERY    .  COLOSTOMY    . TONSILLECTOMY      Family Psychiatric History: I have reviewed family psychiatric history from my progress note on 09/09/2017  Family History:  Family History  Problem Relation Age of Onset  . Depression Father     Social History: Reviewed social history from my progress note on 09/09/2017 Social History   Socioeconomic History  . Marital status: Married    Spouse name: thelma  . Number of children: 0  . Years of education: Not on file  . Highest education level: Associate degree: occupational,  Scientist, product/process development, or vocational program  Occupational History  . Not on file  Tobacco Use  . Smoking status: Never Smoker  . Smokeless tobacco: Never Used  Vaping Use  . Vaping Use: Never used  Substance and Sexual Activity  . Alcohol use: Yes    Comment: social  . Drug use: No  . Sexual activity: Not on file  Other Topics Concern  . Not on file  Social History Narrative  . Not on file   Social Determinants of Health   Financial Resource Strain: Not on file  Food Insecurity: Not on file  Transportation Needs: Not on file  Physical Activity: Not on file  Stress: Not on file  Social Connections: Not on file    Allergies: No Known Allergies  Metabolic Disorder Labs: Lab Results  Component Value Date   HGBA1C 6.3 (H) 03/18/2020   MPG 134.11 03/18/2020   No results found for: PROLACTIN No results found for: CHOL, TRIG, HDL, CHOLHDL, VLDL, LDLCALC No results found for: TSH  Therapeutic Level Labs: Lab Results  Component Value Date   LITHIUM 1.24 (H) 01/04/2016   LITHIUM 1.51 (HH) 01/03/2016   No results found for: VALPROATE No components found for:  CBMZ  Current Medications: Current Outpatient Medications  Medication Sig Dispense Refill  . ascorbic acid (VITAMIN C) 250 MG tablet Take by mouth.    . ceFEPIme (MAXIPIME) 2 g injection     . clotrimazole (LOTRIMIN) 1 % cream Apply topically twice daily for 1 week. 28 g 1  . ELIQUIS 5 MG TABS tablet Take 5 mg by mouth 2 (two) times daily.     Marland Kitchen glipiZIDE (GLUCOTROL XL) 10 MG 24 hr tablet Take 10 mg by mouth daily with breakfast.     . memantine (NAMENDA) 10 MG tablet Take 10 mg by mouth 2 (two) times daily.    . metroNIDAZOLE (FLAGYL) 500 MG tablet Take 500 mg by mouth 3 (three) times daily.    . Olmesartan-amLODIPine-HCTZ 20-5-12.5 MG TABS Take 1 tablet by mouth daily.    Marland Kitchen olmesartan-hydrochlorothiazide (BENICAR HCT) 20-12.5 MG tablet Take 1 tablet by mouth daily.    . Omega-3 1000 MG CAPS Take by mouth.    .  Oxcarbazepine (TRILEPTAL) 300 MG tablet Take 1 tablet (300 mg total) by mouth 3 (three) times daily. 270 tablet 1  . QUEtiapine (SEROQUEL) 100 MG tablet Take 1 tablet (100 mg total) by mouth at bedtime. 90 tablet 1  . QUEtiapine (SEROQUEL) 50 MG tablet Take 1 tablet (50 mg total) by mouth 2 (two) times daily as needed. For severe anxiety and agitation 180 tablet 1  . rosuvastatin (CRESTOR) 20 MG tablet Take 20 mg by mouth daily.     . vitamin B-12 (CYANOCOBALAMIN) 1000 MCG tablet Take 3,000 mcg by mouth daily.     No current facility-administered medications for this visit.     Musculoskeletal: Strength & Muscle Tone: UTA Gait &  Station: UTA Patient leans: N/A  Psychiatric Specialty Exam: Review of Systems  Psychiatric/Behavioral: Negative for agitation, behavioral problems, confusion, decreased concentration, dysphoric mood, hallucinations, self-injury, sleep disturbance and suicidal ideas. The patient is not nervous/anxious and is not hyperactive.   All other systems reviewed and are negative.   There were no vitals taken for this visit.There is no height or weight on file to calculate BMI.  General Appearance: Casual  Eye Contact:  Minimal  Speech:  Slow  Volume:  Decreased  Mood:  Euthymic, patient reports he is okay however per wife patient is anxious and fearful, does not want to be left alone, needs company all the time.  Affect:  Congruent  Thought Process:  Goal Directed and Descriptions of Associations: Intact  Orientation:  Full (Time, Place, and Person)  Thought Content: Logical   Suicidal Thoughts:  No  Homicidal Thoughts:  No  Memory:  Immediate;   Fair Recent;   Fair Remote;   Fair  Judgement:  Fair  Insight:  Shallow  Psychomotor Activity:  Normal  Concentration:  Concentration: Fair and Attention Span: Fair  Recall:  [employer] of Knowledge: Fair  Language: Fair  Akathisia:  No  Handed:  Right  AIMS (if indicated): UTA  Assets:  Desire for  Improvement Housing Social Support  ADL's:  Intact  Cognition: Impaired,  Mild  Sleep:  Fair   Screenings: AIMS   Flowsheet Row [facility]  AIMS Total Score 0    Flowsheet Row ED to Hosp-Admission (Discharged) from 01/16/2020 in [facility]  C-SSRS RISK CATEGORY No Risk       Assessment and Plan: Linkon Siverson is a 65 year old African-American male who has a history of bipolar disorder, multiple medical problems including recent diagnosis of dementia likely frontal is currently struggling with anxiety status post recent admission for medical reasons.  Patient has good social support system and is currently compliant on medications.  Discussed plan as noted below.  Plan Bipolar disorder in remission Trileptal 300 mg p.o. 3 times daily Seroquel 100 mg p.o. nightly and Seroquel 50 mg p.o. twice daily as needed for anxiety attacks  Insomnia-stable Seroquel 100 mg p.o. nightly  GAD-unstable Discussed referral for IOP-referred to [facility]. Also provided information for [facility].  Patient spouse advised to contact them. Patient will benefit from psychotherapy sessions. Continue medications as noted above.  Major neurocognitive disorder, frontal-continue follow-up with neurology  High risk medication use-I have reviewed most recent labs-sodium level-dated 07/24/2020-within normal limits.  Discussed with spouse and patient about getting the following labs-hemoglobin A1c, lipid panel, prolactin if not already done.  I have reviewed most recent notes from his hospital admission dated 07/07/2020-Dr.Serrano-for acute cystitis, hyponatremia, encephalopathy.  Acute toxic metabolic encephalopathy-multifactorial due to infection, dehydration, hyponatremia.  CT head negative for acute intracranial process.  Mental status improved with treatment of UTI and resolution of  hyponatremia.'  Collateral information obtained from spouse.  Follow-up in office in 6 weeks or sooner if needed.  I have spent atleast 30 minutes with patient today which includes the time spent for preparing to see the patient ( e.g., review of test, records ), obtaining and to review and separately obtained history , ordering medications and test ,psychoeducation and supportive psychotherapy and care coordination,as well as documenting clinical information in electronic health record,interpreting and communication of test results This note was generated in part or whole with voice recognition software.  Voice recognition is usually quite accurate but there are transcription errors that can and very often do occur. I apologize for any typographical errors that were not detected and corrected.          [name], MD 09/06/2020, 6:45 PM

## 2020-09-06 NOTE — Telephone Encounter (Signed)
D:  Dr. Elna Breslow referred pt to MH-IOP.  Dr. Elna Breslow requested that case manager speaks to pt's wife about the program.  A:  Placed call to orient and answer the wife's questions.  Mrs. [name] inquired about the price once cm told her about the program.  "If the insurance company doesn't pay for it, he can't attend."  Encouraged her to verify his benefits before making a decision. She did agree to call the number on the card to find out about coverage for MH-IOP.  Inform Dr. Elna Breslow.  Pt will contact cm once she calls [employer].

## 2020-10-11 ENCOUNTER — Telehealth: Payer: Self-pay

## 2020-10-11 NOTE — Telephone Encounter (Signed)
Wilkie Aye from [facility] called needing a verbal order for a monthly catheter change. Per Sam's last note verbal order was given for 1 month catheter exchange 16 FR silicone foley with night bag for drainage. Wilkie Aye gave verbal read back of order.

## 2020-10-17 ENCOUNTER — Ambulatory Visit (INDEPENDENT_AMBULATORY_CARE_PROVIDER_SITE_OTHER): Payer: Medicare Other | Admitting: Psychiatry

## 2020-10-17 ENCOUNTER — Other Ambulatory Visit: Payer: Self-pay

## 2020-10-17 ENCOUNTER — Encounter: Payer: Self-pay | Admitting: Psychiatry

## 2020-10-17 VITALS — BP 158/92 | HR 90 | Temp 97.8°F

## 2020-10-17 DIAGNOSIS — F411 Generalized anxiety disorder: Secondary | ICD-10-CM

## 2020-10-17 DIAGNOSIS — F0390 Unspecified dementia without behavioral disturbance: Secondary | ICD-10-CM

## 2020-10-17 DIAGNOSIS — G4701 Insomnia due to medical condition: Secondary | ICD-10-CM

## 2020-10-17 DIAGNOSIS — F3178 Bipolar disorder, in full remission, most recent episode mixed: Secondary | ICD-10-CM

## 2020-10-17 NOTE — Progress Notes (Signed)
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  Patient being a limited historian and collateral information was obtained from wife who was present in session today.  According to wife patient continues to have anxiety when left alone.  He wants his wife to be around all the time.  She however reports he now has home health coming in 3 times a week and they help with the catheter as well as his wound on his right foot.  He is also in physical therapy, currently wears a boot on his right foot.  However physical therapist wants him to heal well before doing too much weightbearing.  Patient according to wife spends his time watching TV, taking naps during the day.  He does go out in his wheelchair when someone takes him out.  Patient is compliant on medications.  He is sleeping well at night.  Patient appeared to be alert, oriented to person place time and situation.  Patient only answered questions in short phrases, denied any  anxiety hallucinations, suicidality, homicidality or other concerns.  Patient denied any side effects to medications.  However on examination he was noted as having mild tremors of his bilateral upper extremities as well as his tongue which according to wife has been going on for a very long time.  Patient was also observed as having swelling of his right hand which according to wife's new onset since she was not aware of this.  She agrees to call primary care provider.  Patient denies any other concerns today.  Visit Diagnosis:    ICD-10-CM   1. Bipolar disorder, in full remission, most recent episode mixed (HCC)  F31.78   2. GAD  (generalized anxiety disorder)  F41.1   3. Insomnia due to medical condition  G47.01   4. 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 Bipolar disorder (HCC)   . Diabetes mellitus without complication (HCC)   . History of blood clots   . Hypertension   . TBI (traumatic brain injury) [facility])     Past Surgical History:  Procedure Laterality Date  . BACK SURGERY    . CARPAL TUNNEL RELEASE Bilateral   . COLON SURGERY    . COLOSTOMY    . TONSILLECTOMY      Family Psychiatric History: I have reviewed family psychiatric history from progress note on 09/09/2017  Family History:  Family History  Problem Relation Age of Onset  . Depression Father     Social History: Reviewed social history from progress note on 09/09/2017 Social History   Socioeconomic History  . Marital status: Married    Spouse name: thelma  . Number of children: 0  . Years of education: Not on file  . Highest education level: Associate degree: occupational, Scientist, product/process development, or vocational program  Occupational History  . Not on file  Tobacco Use  . Smoking status: Never Smoker  . Smokeless tobacco: Never Used  Vaping Use  . Vaping Use: Never used  Substance and Sexual Activity  .  Alcohol use: Yes    Comment: social  . Drug use: No  . Sexual activity: Not on file  Other Topics Concern  . Not on file  Social History Narrative  . Not on file   Social Determinants of Health   Financial Resource Strain: Not on file  Food Insecurity: Not on file  Transportation Needs: Not on file  Physical Activity: Not on file  Stress: Not on file  Social Connections: Not on file    Allergies: No Known Allergies  Metabolic Disorder Labs: Lab Results  Component Value Date   HGBA1C 6.3 (H) 03/18/2020    MPG 134.11 03/18/2020   No results found for: PROLACTIN No results found for: CHOL, TRIG, HDL, CHOLHDL, VLDL, LDLCALC No results found for: TSH  Therapeutic Level Labs: Lab Results  Component Value Date   LITHIUM 1.24 (H) 01/04/2016   LITHIUM 1.51 (HH) 01/03/2016   No results found for: VALPROATE No components found for:  CBMZ  Current Medications: Current Outpatient Medications  Medication Sig Dispense Refill  . ascorbic acid (VITAMIN C) 250 MG tablet Take by mouth.    . ceFEPIme (MAXIPIME) 2 g injection     . clotrimazole (LOTRIMIN) 1 % cream Apply topically twice daily for 1 week. 28 g 1  . ELIQUIS 5 MG TABS tablet Take 5 mg by mouth 2 (two) times daily.     Marland Kitchen glipiZIDE (GLUCOTROL XL) 10 MG 24 hr tablet Take 10 mg by mouth daily with breakfast.     . memantine (NAMENDA) 10 MG tablet Take 10 mg by mouth 2 (two) times daily.    . metroNIDAZOLE (FLAGYL) 500 MG tablet Take 500 mg by mouth 3 (three) times daily.    . Olmesartan-amLODIPine-HCTZ 20-5-12.5 MG TABS Take 1 tablet by mouth daily.    Marland Kitchen olmesartan-hydrochlorothiazide (BENICAR HCT) 20-12.5 MG tablet Take 1 tablet by mouth daily.    . Omega-3 1000 MG CAPS Take by mouth.    . Oxcarbazepine (TRILEPTAL) 300 MG tablet Take 1 tablet (300 mg total) by mouth 3 (three) times daily. 270 tablet 1  . QUEtiapine (SEROQUEL) 100 MG tablet Take 1 tablet (100 mg total) by mouth at bedtime. 90 tablet 1  . QUEtiapine (SEROQUEL) 50 MG tablet Take 1 tablet (50 mg total) by mouth 2 (two) times daily as needed. For severe anxiety and agitation 180 tablet 1  . rosuvastatin (CRESTOR) 20 MG tablet Take 20 mg by mouth daily.     . vitamin B-12 (CYANOCOBALAMIN) 1000 MCG tablet Take 3,000 mcg by mouth daily.     No current facility-administered medications for this visit.     Musculoskeletal: Strength & Muscle Tone: UTA Gait & Station: Wheelchair bound Patient leans: N/A  Psychiatric Specialty Exam: Review of Systems  Musculoskeletal:        Right hand swelling   Psychiatric/Behavioral: Negative for confusion, decreased concentration, dysphoric mood, hallucinations, self-injury, sleep disturbance and suicidal ideas. The patient is nervous/anxious.     Blood pressure (!) 158/92, pulse 90, temperature 97.8 F (36.6 C), temperature source Temporal.There is no height or weight on file to calculate BMI.  General Appearance: Casual  Eye Contact:  Fair  Speech:  Normal Rate  Volume:  Normal  Mood:  Anxious  Affect:  Congruent  Thought Process:  Goal Directed and Descriptions of Associations: Intact  Orientation:  Full (Time, Place, and Person)  Thought Content: Logical   Suicidal Thoughts:  No  Homicidal Thoughts:  No  Memory:  Immediate;  Fair Recent;   Baseline Remote;   Limited  Judgement:  Fair  Insight:  Fair  Psychomotor Activity:  Tremor  Concentration:  Concentration: Fair and Attention Span: Fair  Recall:  [employer] of Knowledge: Fair  Language: Fair  Akathisia:  No  Handed:  Right  AIMS (if indicated): done  Assets:  Communication Skills Desire for Improvement Social Support  ADL's:  Intact  Cognition: Baseline  Sleep:  Fair   Screenings: AIMS   Flowsheet Row Office Visit from 03/10/2018 in [facility]  AIMS Total Score 0    PHQ2-9   Flowsheet Row Office Visit from 10/17/2020 in [facility]  PHQ-2 Total Score 0    Flowsheet Row Office Visit from 10/17/2020 in [facility] ED to Hosp-Admission (Discharged) from 01/16/2020 in [facility]  C-SSRS RISK CATEGORY Error: Question 6 not populated No Risk       Assessment and Plan: Roi Jafari is a 65 year old African-American male who has a history of bipolar disorder, multiple medical problems including recent diagnosis of dementia likely from the is currently making progress with regards to his anxiety.  Plan as noted below.  Plan Bipolar  disorder in remission Trileptal 300 mg p.o. 3 times daily Seroquel 100 mg p.o. nightly. Discussed to reduce the Seroquel dosage during the day since according to wife she has been giving him a scheduled 50 mg every day in the morning and using a second dose of 50 mg as needed for anxiety.  Advised to skip the morning dose of Seroquel at least some days or reduce it to a 25 mg few days a week to reduce drowsiness as well as due to his tremors.  Insomnia-stable Seroquel 100 mg p.o. nightly  GAD-improving Continue medications as prescribed Patient was referred for psychotherapy sessions in the past.  Major neurocognitive disorder-continue follow-up with neurology  Spouse to contact her primary care provider for swelling of right hand.  Also discussed to continue to monitor sodium level and cbc given the fact that he is on Trileptal.  Collateral information obtained from spouses noted above.  Follow-up in clinic in 2 months or sooner if needed.    [name], MD 10/18/2020, 8:32 AM

## 2020-10-18 DIAGNOSIS — M4628 Osteomyelitis of vertebra, sacral and sacrococcygeal region: Secondary | ICD-10-CM | POA: Insufficient documentation

## 2020-12-05 ENCOUNTER — Encounter: Payer: Self-pay | Admitting: Psychiatry

## 2020-12-05 ENCOUNTER — Other Ambulatory Visit: Payer: Self-pay

## 2020-12-05 ENCOUNTER — Telehealth (INDEPENDENT_AMBULATORY_CARE_PROVIDER_SITE_OTHER): Payer: Medicare Other | Admitting: Psychiatry

## 2020-12-05 DIAGNOSIS — F0390 Unspecified dementia without behavioral disturbance: Secondary | ICD-10-CM | POA: Diagnosis not present

## 2020-12-05 DIAGNOSIS — F411 Generalized anxiety disorder: Secondary | ICD-10-CM

## 2020-12-05 DIAGNOSIS — F3178 Bipolar disorder, in full remission, most recent episode mixed: Secondary | ICD-10-CM | POA: Diagnosis not present

## 2020-12-05 DIAGNOSIS — G4701 Insomnia due to medical condition: Secondary | ICD-10-CM

## 2020-12-05 MED ORDER — QUETIAPINE FUMARATE 100 MG PO TABS: 100.0000 mg | ORAL_TABLET | Freq: Every day | ORAL | 1 refills | Status: DC

## 2020-12-05 MED ORDER — OXCARBAZEPINE 300 MG PO TABS: 300.0000 mg | ORAL_TABLET | Freq: Two times a day (BID) | ORAL | 1 refills | Status: DC

## 2020-12-05 NOTE — Progress Notes (Signed)
Virtual Visit via Video Note  I connected with Gregroy [name] on 12/05/20 at 11:00 AM EDT by a video enabled telemedicine application and verified that I am speaking with the correct person using two identifiers.  Location Provider Location : Office Patient Location : Home  Participants: Patient ,Spouse, Provider    I discussed the limitations of evaluation and management by telemedicine and the availability of in person appointments. The patient expressed understanding and agreed to proceed.   I discussed the assessment and treatment plan with the patient. The patient was provided an opportunity to ask questions and all were answered. The patient agreed with the plan and demonstrated an understanding of the instructions.   The patient was advised to call back or seek an in-person evaluation if the symptoms worsen or if the condition fails to improve as anticipated.   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  Patient being a limited historian, collateral information was obtained from wife.  Patient today reports he is currently being treated for a urinary tract infection.  He is currently on Macrobid.  He reports his urinary tract symptoms is improving.  Patient reports he does feel frustrated about his medical problems in general.  However overall he is making progress.  Patient denies any significant mood swings.  Denies any sleep problems.  Patient reports he currently does not have any side effects to medications.  Patient denies any suicidality, homicidality.  Did not appear to be preoccupied with any delusions.  According to wife patient is  compliant on medications and overall is doing okay.    Visit Diagnosis:    ICD-10-CM   1. Bipolar disorder, in full remission, most recent episode mixed (HCC)  F31.78 QUEtiapine (SEROQUEL) 100 MG tablet    Oxcarbazepine (TRILEPTAL) 300 MG tablet  2. GAD (generalized anxiety disorder)  F41.1   3. Insomnia due to medical condition  G47.01    pain, UTI  4. 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 Bipolar disorder (HCC)   . Diabetes mellitus without complication (HCC)   . History of blood clots   . Hypertension   . TBI (traumatic brain injury) [facility])     Past Surgical History:  Procedure Laterality Date  . BACK SURGERY    . CARPAL TUNNEL RELEASE Bilateral   . COLON SURGERY    . COLOSTOMY    . TONSILLECTOMY      Family Psychiatric History: I have reviewed family psychiatric history from progress note on 09/09/2017  Family History:  Family History  Problem Relation Age of Onset  . Depression Father     Social History: Reviewed social history from progress note on 09/09/2017 Social History   Socioeconomic History  . Marital status: Married    Spouse name: thelma  . Number of children: 0  . Years of education: Not on file  . Highest education level: Associate degree: occupational, Scientist, product/process development, or vocational program  Occupational History  . Not on file  Tobacco Use  .  Smoking status: Never Smoker  . Smokeless tobacco: Never Used  Vaping Use  . Vaping Use: Never used  Substance and Sexual Activity  . Alcohol use: Yes    Comment: social  . Drug use: No  . Sexual activity: Not on file  Other Topics Concern  . Not on file  Social History Narrative  . Not on file   Social Determinants of Health   Financial Resource Strain: Not on file  Food  Insecurity: Not on file  Transportation Needs: Not on file  Physical Activity: Not on file  Stress: Not on file  Social Connections: Not on file    Allergies: No Known Allergies  Metabolic Disorder Labs: Lab Results  Component Value Date   HGBA1C 6.3 (H) 03/18/2020   MPG 134.11 03/18/2020   No results found for: PROLACTIN No results found for: CHOL, TRIG, HDL, CHOLHDL, VLDL, LDLCALC No results found for: TSH  Therapeutic Level Labs: Lab Results  Component Value Date   LITHIUM 1.24 (H) 01/04/2016   LITHIUM 1.51 (HH) 01/03/2016   No results found for: VALPROATE No components found for:  CBMZ  Current Medications: Current Outpatient Medications  Medication Sig Dispense Refill  . ascorbic acid (VITAMIN C) 250 MG tablet Take by mouth.    . ceFEPIme (MAXIPIME) 2 g injection     . clotrimazole (LOTRIMIN) 1 % cream Apply topically twice daily for 1 week. 28 g 1  . ELIQUIS 5 MG TABS tablet Take 5 mg by mouth 2 (two) times daily.     Marland Kitchen glipiZIDE (GLUCOTROL XL) 10 MG 24 hr tablet Take 10 mg by mouth daily with breakfast.     . memantine (NAMENDA) 10 MG tablet Take 10 mg by mouth 2 (two) times daily.    . metroNIDAZOLE (FLAGYL) 500 MG tablet Take 500 mg by mouth 3 (three) times daily.    . nitrofurantoin, macrocrystal-monohydrate, (MACROBID) 100 MG capsule Take 100 mg by mouth 2 (two) times daily.    . Olmesartan-amLODIPine-HCTZ 20-5-12.5 MG TABS Take 1 tablet by mouth daily.    Marland Kitchen olmesartan-hydrochlorothiazide (BENICAR HCT) 20-12.5 MG tablet Take 1 tablet by mouth daily.    . Omega-3 1000 MG CAPS Take by mouth.    . Oxcarbazepine (TRILEPTAL) 300 MG tablet Take 1 tablet (300 mg total) by mouth 2 (two) times daily. 180 tablet 1  . QUEtiapine (SEROQUEL) 100 MG tablet Take 1 tablet (100 mg total) by mouth at bedtime. 90 tablet 1  . QUEtiapine (SEROQUEL) 50 MG tablet Take 1 tablet (50 mg total) by mouth 2 (two) times daily as needed. For severe anxiety and agitation 180 tablet 1  .  rosuvastatin (CRESTOR) 20 MG tablet Take 20 mg by mouth daily.     . vitamin B-12 (CYANOCOBALAMIN) 1000 MCG tablet Take 3,000 mcg by mouth daily.     No current facility-administered medications for this visit.     Musculoskeletal: Strength & Muscle Tone: UTA Gait & Station: walks with walker Patient leans: N/A  Psychiatric Specialty Exam: Review of Systems  Unable to perform ROS: Dementia    There were no vitals taken for this visit.There is no height or weight on file to calculate BMI.  General Appearance: Fairly Groomed  Eye Contact:  Fair  Speech:  Clear and Coherent  Volume:  Normal  Mood:  Anxious  Affect:  Congruent  Thought Process:  Goal Directed and Descriptions of Associations: Intact  Orientation:  Full (Time, Place, and Person)  Thought Content: Logical  Suicidal Thoughts:  No  Homicidal Thoughts:  No  Memory:  Immediate;   Fair Recent;   Fair Remote;   Limited  Judgement:  Fair  Insight:  Fair  Psychomotor Activity:  Normal  Concentration:  Concentration: Fair and Attention Span: Fair  Recall:  [employer] of Knowledge: Fair  Language: Fair  Akathisia:  No  Handed:  Right  AIMS (if indicated): UTA  Assets:  Desire for Improvement Housing Social Support  ADL's:  Intact  Cognition: baseline  Sleep:  Fair   Screenings: AIMS   Flowsheet Row Office Visit from 03/10/2018 in [facility]  AIMS Total Score 0    PHQ2-9   Flowsheet Row Video Visit from 12/05/2020 in [facility] Office Visit from 10/17/2020 in [facility]  PHQ-2 Total Score 1 0  PHQ-9 Total Score 2 --    Flowsheet Row Office Visit from 10/17/2020 in [facility] ED to Hosp-Admission (Discharged) from 01/16/2020 in [facility]  C-SSRS RISK CATEGORY Error: Question 6 not populated No Risk       Assessment and Plan: Verna Hamon is a 65 year old  African-American male who has a history of bipolar disorder, right heel ulcer, 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  Patient is currently stable with regards to his mood, sleep.  Plan Bipolar disorder in remission Trileptal 300 mg p.o. twice daily.  According to wife patient is currently only taking the 300 mg twice a day.   Seroquel 100 mg p.o. nightly Patient does have Seroquel 50 mg as needed for severe anxiety symptoms or agitation.  Insomnia-stable Seroquel 100 mg p.o. nightly  GAD-improving Will monitor closely. Continue Seroquel which will help with anxiety also. Patient was referred for CBT in the past.  Major neurocognitive disorder-continue follow-up with neurology.  Collateral information obtained from wife as noted above.  Follow-up in clinic in office in 3 months.  This note was generated in part or whole with voice recognition software. Voice recognition is usually quite accurate but there are transcription errors that can and very often do occur. I apologize for any typographical errors that were not detected and corrected.       [name], MD 12/05/2020, 1:55 PM

## 2021-01-11 ENCOUNTER — Other Ambulatory Visit: Payer: Self-pay | Admitting: Psychiatry

## 2021-01-11 DIAGNOSIS — F3178 Bipolar disorder, in full remission, most recent episode mixed: Secondary | ICD-10-CM

## 2021-02-07 ENCOUNTER — Encounter: Payer: Self-pay | Admitting: Urology

## 2021-02-07 ENCOUNTER — Ambulatory Visit (INDEPENDENT_AMBULATORY_CARE_PROVIDER_SITE_OTHER): Payer: Medicare Other | Admitting: Urology

## 2021-02-07 ENCOUNTER — Other Ambulatory Visit: Payer: Self-pay

## 2021-02-07 VITALS — BP 116/73 | HR 103 | Ht 66.0 in

## 2021-02-07 DIAGNOSIS — N1330 Unspecified hydronephrosis: Secondary | ICD-10-CM

## 2021-02-07 DIAGNOSIS — N319 Neuromuscular dysfunction of bladder, unspecified: Secondary | ICD-10-CM | POA: Diagnosis not present

## 2021-02-07 NOTE — Progress Notes (Signed)
   02/07/2021 10:30 AM   [name] 1956/03/25 [id number]  Reason for visit: Follow up neurogenic bladder, right hydronephrosis, history of urethral stricture  HPI: Extremely comorbid 65 year old male with history of apparent neurogenic bladder since early 2000's when he had a traumatic brain injury.  He also reportedly had a urologist at [facility] in the early 2000's and had a procedure that was "messed up "and resulted in a urethral stricture.  He previously was managing his bladder with catheterization 3-4 times per day, but since his health has declined over the last year he has had an indwelling Foley that is changed monthly by home health nursing secondary to his frailty, weakness, and inability to catheterize.  Catheter was most recently changed 1 week ago.  He has had some mild leakage around the catheter, but no gross hematuria.  He had a renal ultrasound performed in February 2022 for neurogenic bladder to evaluate for hydronephrosis that showed mild right hydronephrosis that was stable from a CT at [facility] in October 2021.  I personally viewed and interpreted both of those images, and there is no stone, mass, or obvious etiology of the mild right-sided hydronephrosis.  He is not having any right-sided flank pain.  In the setting of his stable renal function that is unchanged from 2017, and significant comorbidities and other medical problems including sacral decubitus ulcer with wound VAC in place, I recommended observation at this time.  I recommended repeating a renal ultrasound in 6 months, and if hydronephrosis is worsening considering further imaging with CT.  Possible etiologies discussed at length.  -RTC 6 months with renal ultrasound prior -Continue monthly Foley changes with home health nursing -If overall health is improved at that time, consider resuming CIC.  Consider Myrbetriq at that time as well versus trospium XL as he has has a history of incontinence between  catheterizations.   Sondra Come, MD  [facility] [address] [place], [place] [address] [phone]

## 2021-02-23 ENCOUNTER — Inpatient Hospital Stay
Admission: EM | Admit: 2021-02-23 | Discharge: 2021-03-03 | DRG: 698 | Disposition: A | Discharge status: Skilled Nursing Facility | Payer: Medicare Other | Attending: Student | Admitting: Student

## 2021-02-23 ENCOUNTER — Emergency Department: Payer: Medicare Other

## 2021-02-23 ENCOUNTER — Other Ambulatory Visit: Payer: Self-pay

## 2021-02-23 DIAGNOSIS — B965 Pseudomonas (aeruginosa) (mallei) (pseudomallei) as the cause of diseases classified elsewhere: Secondary | ICD-10-CM | POA: Diagnosis present

## 2021-02-23 DIAGNOSIS — E1122 Type 2 diabetes mellitus with diabetic chronic kidney disease: Secondary | ICD-10-CM | POA: Diagnosis present

## 2021-02-23 DIAGNOSIS — E1140 Type 2 diabetes mellitus with diabetic neuropathy, unspecified: Secondary | ICD-10-CM | POA: Diagnosis present

## 2021-02-23 DIAGNOSIS — Z818 Family history of other mental and behavioral disorders: Secondary | ICD-10-CM | POA: Diagnosis not present

## 2021-02-23 DIAGNOSIS — Z933 Colostomy status: Secondary | ICD-10-CM | POA: Diagnosis not present

## 2021-02-23 DIAGNOSIS — N390 Urinary tract infection, site not specified: Secondary | ICD-10-CM | POA: Diagnosis present

## 2021-02-23 DIAGNOSIS — N3001 Acute cystitis with hematuria: Secondary | ICD-10-CM | POA: Diagnosis not present

## 2021-02-23 DIAGNOSIS — N1831 Chronic kidney disease, stage 3a: Secondary | ICD-10-CM | POA: Diagnosis present

## 2021-02-23 DIAGNOSIS — Z960 Presence of urogenital implants: Secondary | ICD-10-CM | POA: Diagnosis present

## 2021-02-23 DIAGNOSIS — L97419 Non-pressure chronic ulcer of right heel and midfoot with unspecified severity: Secondary | ICD-10-CM | POA: Diagnosis present

## 2021-02-23 DIAGNOSIS — Z7984 Long term (current) use of oral hypoglycemic drugs: Secondary | ICD-10-CM

## 2021-02-23 DIAGNOSIS — N319 Neuromuscular dysfunction of bladder, unspecified: Secondary | ICD-10-CM | POA: Diagnosis present

## 2021-02-23 DIAGNOSIS — E11621 Type 2 diabetes mellitus with foot ulcer: Secondary | ICD-10-CM | POA: Diagnosis present

## 2021-02-23 DIAGNOSIS — T83511A Infection and inflammatory reaction due to indwelling urethral catheter, initial encounter: Secondary | ICD-10-CM | POA: Diagnosis present

## 2021-02-23 DIAGNOSIS — N136 Pyonephrosis: Secondary | ICD-10-CM | POA: Diagnosis present

## 2021-02-23 DIAGNOSIS — N179 Acute kidney failure, unspecified: Secondary | ICD-10-CM | POA: Diagnosis present

## 2021-02-23 DIAGNOSIS — Z20822 Contact with and (suspected) exposure to covid-19: Secondary | ICD-10-CM | POA: Diagnosis present

## 2021-02-23 DIAGNOSIS — Z86718 Personal history of other venous thrombosis and embolism: Secondary | ICD-10-CM

## 2021-02-23 DIAGNOSIS — L89154 Pressure ulcer of sacral region, stage 4: Secondary | ICD-10-CM | POA: Diagnosis present

## 2021-02-23 DIAGNOSIS — F319 Bipolar disorder, unspecified: Secondary | ICD-10-CM | POA: Diagnosis present

## 2021-02-23 DIAGNOSIS — E871 Hypo-osmolality and hyponatremia: Secondary | ICD-10-CM | POA: Diagnosis present

## 2021-02-23 DIAGNOSIS — L89612 Pressure ulcer of right heel, stage 2: Secondary | ICD-10-CM | POA: Diagnosis present

## 2021-02-23 DIAGNOSIS — A419 Sepsis, unspecified organism: Secondary | ICD-10-CM | POA: Diagnosis present

## 2021-02-23 DIAGNOSIS — Z466 Encounter for fitting and adjustment of urinary device: Secondary | ICD-10-CM

## 2021-02-23 DIAGNOSIS — I10 Essential (primary) hypertension: Secondary | ICD-10-CM | POA: Diagnosis not present

## 2021-02-23 DIAGNOSIS — Z6841 Body Mass Index (BMI) 40.0 and over, adult: Secondary | ICD-10-CM | POA: Diagnosis not present

## 2021-02-23 DIAGNOSIS — Z79899 Other long term (current) drug therapy: Secondary | ICD-10-CM

## 2021-02-23 DIAGNOSIS — Z87820 Personal history of traumatic brain injury: Secondary | ICD-10-CM

## 2021-02-23 DIAGNOSIS — E860 Dehydration: Secondary | ICD-10-CM | POA: Diagnosis present

## 2021-02-23 DIAGNOSIS — Z7901 Long term (current) use of anticoagulants: Secondary | ICD-10-CM

## 2021-02-23 DIAGNOSIS — I129 Hypertensive chronic kidney disease with stage 1 through stage 4 chronic kidney disease, or unspecified chronic kidney disease: Secondary | ICD-10-CM | POA: Diagnosis present

## 2021-02-23 DIAGNOSIS — E785 Hyperlipidemia, unspecified: Secondary | ICD-10-CM | POA: Diagnosis present

## 2021-02-23 DIAGNOSIS — F419 Anxiety disorder, unspecified: Secondary | ICD-10-CM | POA: Diagnosis present

## 2021-02-23 LAB — URINALYSIS, COMPLETE (UACMP) WITH MICROSCOPIC
Bilirubin Urine: NEGATIVE
Glucose, UA: NEGATIVE mg/dL
Ketones, ur: NEGATIVE mg/dL
Nitrite: NEGATIVE
Protein, ur: 30 mg/dL — AB
Specific Gravity, Urine: 1.012 (ref 1.005–1.030)
WBC, UA: >50 WBC/hpf — ABNORMAL HIGH (ref 0–5)
pH: 6 (ref 5.0–8.0)

## 2021-02-23 LAB — COMPREHENSIVE METABOLIC PANEL
ALT: 10 U/L (ref 0–44)
AST: 16 U/L (ref 15–41)
Albumin: 2.8 g/dL — ABNORMAL LOW (ref 3.5–5.0)
Alkaline Phosphatase: 64 U/L (ref 38–126)
Anion gap: 9 (ref 5–15)
BUN: 26 mg/dL — ABNORMAL HIGH (ref 8–23)
CO2: 19 mmol/L — ABNORMAL LOW (ref 22–32)
Calcium: 8.1 mg/dL — ABNORMAL LOW (ref 8.9–10.3)
Chloride: 98 mmol/L (ref 98–111)
Creatinine, Ser: 2.01 mg/dL — ABNORMAL HIGH (ref 0.61–1.24)
GFR, Estimated: 36 mL/min — ABNORMAL LOW (ref >60)
Glucose, Bld: 79 mg/dL (ref 70–99)
Potassium: 4 mmol/L (ref 3.5–5.1)
Sodium: 126 mmol/L — ABNORMAL LOW (ref 135–145)
Total Bilirubin: 0.7 mg/dL (ref 0.3–1.2)
Total Protein: 7.9 g/dL (ref 6.5–8.1)

## 2021-02-23 LAB — CBC WITH DIFFERENTIAL/PLATELET
Abs Immature Granulocytes: 0.09 10*3/uL — ABNORMAL HIGH (ref 0.00–0.07)
Basophils Absolute: 0 10*3/uL (ref 0.0–0.1)
Basophils Relative: 0 %
Eosinophils Absolute: 0.1 10*3/uL (ref 0.0–0.5)
Eosinophils Relative: 1 %
HCT: 30.3 % — ABNORMAL LOW (ref 39.0–52.0)
Hemoglobin: 9.8 g/dL — ABNORMAL LOW (ref 13.0–17.0)
Immature Granulocytes: 1 %
Lymphocytes Relative: 8 %
Lymphs Abs: 1.1 10*3/uL (ref 0.7–4.0)
MCH: 27.7 pg (ref 26.0–34.0)
MCHC: 32.3 g/dL (ref 30.0–36.0)
MCV: 85.6 fL (ref 80.0–100.0)
Monocytes Absolute: 0.7 10*3/uL (ref 0.1–1.0)
Monocytes Relative: 5 %
Neutro Abs: 13 10*3/uL — ABNORMAL HIGH (ref 1.7–7.7)
Neutrophils Relative %: 85 %
Platelets: 220 10*3/uL (ref 150–400)
RBC: 3.54 MIL/uL — ABNORMAL LOW (ref 4.22–5.81)
RDW: 17.1 % — ABNORMAL HIGH (ref 11.5–15.5)
WBC: 15 10*3/uL — ABNORMAL HIGH (ref 4.0–10.5)
nRBC: 0 % (ref 0.0–0.2)

## 2021-02-23 LAB — CBG MONITORING, ED: Glucose-Capillary: 76 mg/dL (ref 70–99)

## 2021-02-23 LAB — RESP PANEL BY RT-PCR (FLU A&B, COVID) ARPGX2
Influenza A by PCR: NEGATIVE
Influenza B by PCR: NEGATIVE
SARS Coronavirus 2 by RT PCR: NEGATIVE

## 2021-02-23 LAB — LACTIC ACID, PLASMA: Lactic Acid, Venous: 1.2 mmol/L (ref 0.5–1.9)

## 2021-02-23 MED ORDER — METRONIDAZOLE 500 MG/100ML IV SOLN
500.0000 mg | Freq: Once | INTRAVENOUS | Status: AC
  Administered 2021-02-23: 500 mg via INTRAVENOUS
  Filled 2021-02-23: qty 100

## 2021-02-23 MED ORDER — APIXABAN 5 MG PO TABS
5.0000 mg | ORAL_TABLET | Freq: Two times a day (BID) | ORAL | Status: DC
  Administered 2021-02-23 – 2021-03-03 (×16): 5 mg via ORAL
  Filled 2021-02-23 (×16): qty 1

## 2021-02-23 MED ORDER — ACETAMINOPHEN 325 MG PO TABS: 650.0000 mg | ORAL_TABLET | Freq: Four times a day (QID) | ORAL | Status: DC | PRN

## 2021-02-23 MED ORDER — ONDANSETRON HCL 4 MG/2ML IJ SOLN: 4.0000 mg | Freq: Four times a day (QID) | INTRAMUSCULAR | Status: DC | PRN

## 2021-02-23 MED ORDER — SODIUM CHLORIDE 0.9 % IV SOLN
2.0000 g | Freq: Once | INTRAVENOUS | Status: AC
  Administered 2021-02-23: 2 g via INTRAVENOUS
  Filled 2021-02-23: qty 2

## 2021-02-23 MED ORDER — VANCOMYCIN HCL IN DEXTROSE 1-5 GM/200ML-% IV SOLN
1000.0000 mg | Freq: Once | INTRAVENOUS | Status: DC
  Filled 2021-02-23: qty 200

## 2021-02-23 MED ORDER — BISACODYL 5 MG PO TBEC: 5.0000 mg | DELAYED_RELEASE_TABLET | Freq: Every day | ORAL | Status: DC | PRN

## 2021-02-23 MED ORDER — INSULIN ASPART 100 UNIT/ML IJ SOLN
0.0000 [IU] | Freq: Three times a day (TID) | INTRAMUSCULAR | Status: DC
  Administered 2021-02-25: 1 [IU] via SUBCUTANEOUS
  Administered 2021-02-25 (×2): 2 [IU] via SUBCUTANEOUS
  Administered 2021-02-26 (×2): 1 [IU] via SUBCUTANEOUS
  Administered 2021-02-26: 2 [IU] via SUBCUTANEOUS
  Administered 2021-02-27: 1 [IU] via SUBCUTANEOUS
  Administered 2021-02-27 – 2021-02-28 (×4): 2 [IU] via SUBCUTANEOUS
  Administered 2021-03-01: 1 [IU] via SUBCUTANEOUS
  Administered 2021-03-01 – 2021-03-02 (×4): 2 [IU] via SUBCUTANEOUS
  Administered 2021-03-02: 1 [IU] via SUBCUTANEOUS
  Administered 2021-03-03 (×2): 2 [IU] via SUBCUTANEOUS
  Administered 2021-03-03: 1 [IU] via SUBCUTANEOUS
  Filled 2021-02-23 (×19): qty 1

## 2021-02-23 MED ORDER — HYDROCODONE-ACETAMINOPHEN 5-325 MG PO TABS: 1.0000 | ORAL_TABLET | Freq: Four times a day (QID) | ORAL | Status: DC | PRN

## 2021-02-23 MED ORDER — SODIUM CHLORIDE 0.9% FLUSH
3.0000 mL | Freq: Two times a day (BID) | INTRAVENOUS | Status: DC
  Administered 2021-02-23 – 2021-03-03 (×16): 3 mL via INTRAVENOUS

## 2021-02-23 MED ORDER — LACTATED RINGERS IV SOLN: INTRAVENOUS | Status: AC

## 2021-02-23 MED ORDER — ROSUVASTATIN CALCIUM 10 MG PO TABS
20.0000 mg | ORAL_TABLET | Freq: Every day | ORAL | Status: DC
  Administered 2021-02-24 – 2021-03-03 (×8): 20 mg via ORAL
  Filled 2021-02-23 (×5): qty 2
  Filled 2021-02-23: qty 1
  Filled 2021-02-23 (×2): qty 2

## 2021-02-23 MED ORDER — SODIUM CHLORIDE 0.9 % IV SOLN
1000.0000 mL | Freq: Once | INTRAVENOUS | Status: AC
  Administered 2021-02-23: 1000 mL via INTRAVENOUS

## 2021-02-23 MED ORDER — QUETIAPINE FUMARATE 100 MG PO TABS
100.0000 mg | ORAL_TABLET | Freq: Every day | ORAL | Status: DC
  Administered 2021-02-23 – 2021-03-02 (×8): 100 mg via ORAL
  Filled 2021-02-23 (×3): qty 4
  Filled 2021-02-23: qty 1
  Filled 2021-02-23 (×2): qty 4
  Filled 2021-02-23: qty 1
  Filled 2021-02-23 (×2): qty 4
  Filled 2021-02-23 (×7): qty 1

## 2021-02-23 MED ORDER — ACETAMINOPHEN 650 MG RE SUPP: 650.0000 mg | Freq: Four times a day (QID) | RECTAL | Status: DC | PRN

## 2021-02-23 MED ORDER — SODIUM CHLORIDE 0.9 % IV SOLN
3.0000 g | Freq: Four times a day (QID) | INTRAVENOUS | Status: DC
  Administered 2021-02-23 – 2021-02-26 (×11): 3 g via INTRAVENOUS
  Filled 2021-02-23 (×14): qty 8

## 2021-02-23 MED ORDER — MORPHINE SULFATE (PF) 2 MG/ML IV SOLN: 1.0000 mg | INTRAVENOUS | Status: DC | PRN

## 2021-02-23 MED ORDER — ONDANSETRON HCL 4 MG PO TABS: 4.0000 mg | ORAL_TABLET | Freq: Four times a day (QID) | ORAL | Status: DC | PRN

## 2021-02-23 NOTE — Progress Notes (Signed)
CODE SEPSIS - PHARMACY COMMUNICATION  **Broad Spectrum Antibiotics should be administered within 1 hour of Sepsis diagnosis**  Time Code Sepsis Called/Page Received: 1444  Antibiotics Ordered: Cefepime, vanc, metro  Time of 1st antibiotic administration: 1526  Additional action taken by pharmacy:    If necessary, Name of Provider/Nurse Contacted:      Angelique Blonder ,PharmD Clinical Pharmacist  02/23/2021  3:33 PM

## 2021-02-23 NOTE — ED Triage Notes (Signed)
Pt to ER via [facility] from home with complaints of progressive weakness x3 days. Reports he had a wound vac in place for a sacral wound,today was the last dose of his antibiotics. Pt has had decreased PO intake. Denies pain.   Pt has colostomy and foley in place.   Ems VS- bp 125/88, o2 sats 99% on RA, HR 114.

## 2021-02-23 NOTE — Progress Notes (Signed)
Elink following Code Sepsis. 

## 2021-02-23 NOTE — Progress Notes (Signed)
PHARMACY -  BRIEF ANTIBIOTIC NOTE   Pharmacy has received consult(s) for Vancomycin, cefepime from an ED provider.  The patient's profile has been reviewed for ht/wt/allergies/indication/available labs.    One time order(s) placed by MD for Vancomycin 1 gm ,  cefepime 2 gm   Further antibiotics/pharmacy consults should be ordered by admitting physician if indicated.                       Thank you, Estiven Kohan A 02/23/2021  2:41 PM

## 2021-02-23 NOTE — ED Notes (Signed)
New colostomy bag applied to prev base dated 8/25 still completely attached all around edges.

## 2021-02-23 NOTE — ED Notes (Signed)
Report given to Janet RN 

## 2021-02-23 NOTE — H&P (Addendum)
History and Physical    [name] INO:[id number] DOB: 1956-03-11 DOA: 02/23/2021  PCP: Dione Booze, MD   Patient coming from:  home   Chief Complaint: :"not feeling well"  HPI: 65 y/o M w/ PMH of TBI, neurogenic bladder w/ chronic indwelling foley placed over this last year, ostomy bag, morbidly obese, DM2, HTN, HLD, DVT on eliquis who presents w/ generalized weakness x 1 week. Pt is poor historian and hx was obtained from pt and previous progress notes. Pt denies pain, fever, chills, sweating, cough, chest pain, shortness of breath, nausea, vomiting, abd pain, dysuria, urinary frequency, urinary urgency, diarrhea, or constipation.   In the ED, pt was found to have UA was abnormal but pt denies any GU symptoms as pt a chronic indwelling foley. The foley is evidently has the foley changed monthly by [facility] RN. Pt lives with his wife and goddaughter.   Review of Systems: As per HPI otherwise 14 point review of systems negative.    Past Medical History:  Diagnosis Date   Bipolar disorder (HCC)    Diabetes mellitus without complication (HCC)    History of blood clots    Hypertension    TBI (traumatic brain injury) (HCC)     Past Surgical History:  Procedure Laterality Date   BACK SURGERY     CARPAL TUNNEL RELEASE Bilateral    COLON SURGERY     COLOSTOMY     TONSILLECTOMY       reports that he has never smoked. He has never used smokeless tobacco. He reports current alcohol use. He reports that he does not use drugs.  No Known Allergies  Family History  Problem Relation Age of Onset   Depression Father      Prior to Admission medications   Medication Sig Start Date End Date Taking? Authorizing Provider  ascorbic acid (VITAMIN C) 250 MG tablet Take by mouth. 07/25/20 07/25/21  [provider]  clotrimazole (LOTRIMIN) 1 % cream Apply topically twice daily for 1 week. 08/09/20   Vaillancourt, Samantha, PA-C  ELIQUIS 5 MG TABS tablet Take 5 mg by mouth 2  (two) times daily.  11/17/18   [provider]  glipiZIDE (GLUCOTROL XL) 10 MG 24 hr tablet Take 10 mg by mouth daily with breakfast.  09/26/15   [provider]  memantine (NAMENDA) 10 MG tablet Take 10 mg by mouth 2 (two) times daily.    [provider]  Olmesartan-amLODIPine-HCTZ 20-5-12.5 MG TABS Take 1 tablet by mouth daily.    [provider]  olmesartan-hydrochlorothiazide (BENICAR HCT) 20-12.5 MG tablet Take 1 tablet by mouth daily.    [provider]  Omega-3 1000 MG CAPS Take by mouth.    [provider]  Oxcarbazepine (TRILEPTAL) 300 MG tablet Take 1 tablet (300 mg total) by mouth 2 (two) times daily. 01/11/21   [name], MD  QUEtiapine (SEROQUEL) 100 MG tablet Take 1 tablet (100 mg total) by mouth at bedtime. 12/05/20   [name], MD  QUEtiapine (SEROQUEL) 50 MG tablet Take 1 tablet (50 mg total) by mouth 2 (two) times daily as needed. For severe anxiety and agitation Patient not taking: Reported on 02/23/2021 05/03/20   [name], MD  rosuvastatin (CRESTOR) 20 MG tablet Take 20 mg by mouth daily.  08/29/17   [provider]  vitamin B-12 (CYANOCOBALAMIN) 1000 MCG tablet Take 3,000 mcg by mouth daily.    [provider]    Physical Exam: Vitals:   02/23/21 1236 02/23/21  1300 02/23/21 1439  BP: 122/78 125/78 106/68  Pulse: 93 97   Resp: 15 15 15   Temp: 99 F (37.2 C)    TempSrc: Oral    SpO2: 98% 99%   Weight: 112.9 kg    Height: 5\' 6"  (1.676 m)      Constitutional: NAD, calm but uncomfortable Vitals:   02/23/21 1236 02/23/21 1300 02/23/21 1439  BP: 122/78 125/78 106/68  Pulse: 93 97   Resp: 15 15 15   Temp: 99 F (37.2 C)    TempSrc: Oral    SpO2: 98% 99%   Weight: 112.9 kg    Height: 5\' 6"  (1.676 m)     Eyes: PERRL, lids  normal ENMT: Mucous membranes are moist. Posterior pharynx clear  Neck: normal, supple Respiratory: diminished breath sounds b/l. No wheezes, rales or rhonchi    Cardiovascular: S1/S2+, no rubs / gallops. No extremity edema. B/l LE edema Abdomen: soft, NT, obese, ostomy bag present w/ brown stool present & normal bowel sounds  Musculoskeletal: no clubbing / cyanosis. No joint deformity upper and lower extremities.  Skin: sacral wound (unknown stage) & dry right heel ulcer  Neurologic: CN 2-12 grossly intact. Decreased strength of b/l UE and b/l LE  Psychiatric: abnormal judgment and insight. Alert and oriented x 3. Flat mood and affect     Labs on Admission: I have personally reviewed following labs and imaging studies  CBC: Recent Labs  Lab 02/23/21 1255  WBC 15.0*  NEUTROABS 13.0*  HGB 9.8*  HCT 30.3*  MCV 85.6  PLT 220   Basic Metabolic Panel: Recent Labs  Lab 02/23/21 1255  NA 126*  K 4.0  CL 98  CO2 19*  GLUCOSE 79  BUN 26*  CREATININE 2.01*  CALCIUM 8.1*   GFR: Estimated Creatinine Clearance: 43.2 mL/min (A) (by C-G formula based on SCr of 2.01 mg/dL (H)). Liver Function Tests: Recent Labs  Lab 02/23/21 1255  AST 16  ALT 10  ALKPHOS 64  BILITOT 0.7  PROT 7.9  ALBUMIN 2.8*   No results for input(s): LIPASE, AMYLASE in the last 168 hours. No results for input(s): AMMONIA in the last 168 hours. Coagulation Profile: No results for input(s): INR, PROTIME in the last 168 hours. Cardiac Enzymes: No results for input(s): CKTOTAL, CKMB, CKMBINDEX, TROPONINI in the last 168 hours. BNP (last 3 results) No results for input(s): PROBNP in the last 8760 hours. HbA1C: No results for input(s): HGBA1C in the last 72 hours. CBG: No results for input(s): GLUCAP in the last 168 hours. Lipid Profile: No results for input(s): CHOL, HDL, LDLCALC, TRIG, CHOLHDL, LDLDIRECT in the last 72 hours. Thyroid Function Tests: No results for input(s): TSH, T4TOTAL, FREET4, T3FREE, THYROIDAB in the last 72 hours. Anemia Panel: No results for input(s): VITAMINB12, FOLATE, FERRITIN, TIBC, IRON, RETICCTPCT in the last 72 hours. Urine  analysis:    Component Value Date/Time   COLORURINE YELLOW (A) 02/23/2021 1434   APPEARANCEUR CLOUDY (A) 02/23/2021 1434   LABSPEC 1.012 02/23/2021 1434   PHURINE 6.0 02/23/2021 1434   GLUCOSEU NEGATIVE 02/23/2021 1434   HGBUR MODERATE (A) 02/23/2021 1434   BILIRUBINUR NEGATIVE 02/23/2021 1434   KETONESUR NEGATIVE 02/23/2021 1434   PROTEINUR 30 (A) 02/23/2021 1434   NITRITE NEGATIVE 02/23/2021 1434   LEUKOCYTESUR LARGE (A) 02/23/2021 1434    Radiological Exams on Admission: DG Chest Port 1 View  Result Date: 02/23/2021 CLINICAL DATA:  Sepsis EXAM: PORTABLE CHEST 1 VIEW COMPARISON:  Chest x-ray dated March 18, 2020  FINDINGS: Cardiac and mediastinal contours are unchanged and within normal limits. No focal consolidation. No large pleural effusion or pneumothorax. IMPRESSION: No acute cardiopulmonary abnormality. Electronically Signed   By: Allegra Lai M.D.   On: 02/23/2021 14:33    EKG: Independently reviewed Assessment/Plan Active Problems:   UTI (urinary tract infection)  Sepsis: meets criteria w/ tachycardia, leukocytosis and likely UTI and possible sacral infection. Given IV cefepime, flagyl & vanco x 1 in ER. Will be started on IV unasyn. Blood cx and urine cx are pending. Continue on IVFs. Pro-cal ordered  Possible UTI: UA is abnormal. Urine cx is pending. Continue on IV abxs  Neurogenic bladder: chronic indwelling foley placed this past year. Changed monthly by [facility] RN & likely last changed at the beginning of this month.  Sacral ulcer: possibly infected. Present on admission and unknown stage. Wound care consulted  Right heel ulcer: present on admission. Wound care consulted   Hyponatremia: possibly secondary dehydration as pt has not eaten or drink anything all day. Continue on IVFs. Repeat Na level ordered. AA&Ox3.  Likely AKI on CKD: baseline Cr/GFR is unknown, but currently stage IIIb. 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 Poor historian   Ostomy bag: present on admission. Unknown reason why it was initially placed. Continue w/ supportive care   DM2: likely poorly controlled. Started on SSI w/ accuchecks. Hold home dose of glipzide   Leukocytosis: likely secondary to above infection. Continue on IV abxs  Likely ACD: likely secondary to CKD. No need for a transfusion currently  Morbidly obese: BMI 40.1. Complicates overall care and prognosis    DVT prophylaxis: eliquis Code Status: full  Family Communication: called pt's wife, [name], no answer & unable to leave voicemail  Disposition Plan: unclear Consults called: none Admission status:  inpatient    Charise Killian MD [facility]   If 7PM-7AM, please contact night-coverage   02/23/2021, 4:44 PM

## 2021-02-23 NOTE — ED Provider Notes (Signed)
[facility] Emergency Department Provider Note   ____________________________________________    I have reviewed the triage vital signs and the nursing notes.   HISTORY  Chief Complaint Weakness     HPI [name] is a 65 y.o. male with history of bipolar disorder, diabetes, TBI, stage IV sacral decubitus, right heel wound, indwelling Foley catheter who presents with generalized weakness, feeling ill.  Patient reports overall he just does not feel well, is unable to describe specific symptoms very well.  No reports of cough or shortness of breath.  Sees wound care for sacral decubitus ulcer, notes had indicated that patient had wound VAC in place although that does not appear to be the case.  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) 06/01/2020   Osteomyelitis of sacrum (HCC) 04/13/2020   Urinary retention 03/19/2020   History of DVT (deep vein thrombosis) 03/19/2020   Pressure injury of sacral region, stage 4 (HCC) 03/19/2020   Severe sepsis with septic shock (HCC) 03/18/2020   Obesity, Class III, BMI 40-49.9 (morbid obesity) (HCC) 03/18/2020   Cellulitis 03/18/2020   Acute cystitis without hematuria    Chronic kidney disease (CKD), stage III (moderate) (HCC) 01/20/2020   Pressure injury of skin 01/17/2020   DVT (deep venous  thrombosis) (HCC) 01/16/2020   Cellulitis of sacral region 01/16/2020   HLD (hyperlipidemia) 01/16/2020   Bipolar disorder, in full remission, most recent episode mixed (HCC) 12/29/2019   High risk medication use 10/25/2019   Noncompliance with treatment plan 10/25/2019   Bipolar I disorder, most recent episode mixed (HCC) 01/07/2019   GAD (generalized anxiety disorder) 01/07/2019   Insomnia due to medical condition 01/07/2019   DVT, recurrent, lower extremity, acute (HCC) 03/06/2016   Bipolar disorder (HCC) 01/03/2016   Lithium toxicity 01/02/2016   Chronic anticoagulation 07/06/2014   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/06/2014   Hyperlipidemia 07/06/2014   Hypertension 07/06/2014   Leg swelling 07/06/2014   Obesity, Class II, BMI 35-39.9, with comorbidity 07/06/2014   On medication for venous thromboembolism 07/06/2014   Sleep apnea 07/06/2014   Type II diabetes mellitus with renal manifestations (HCC) 07/06/2014   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 Authorizing Provider  ascorbic acid (VITAMIN C) 250 MG tablet Take by mouth. 07/25/20 07/25/21  [provider]  clotrimazole (LOTRIMIN) 1 % cream Apply topically twice daily for 1 week. 08/09/20   Vaillancourt, Samantha, PA-C  ELIQUIS 5 MG TABS tablet Take 5 mg by mouth 2 (two) times daily.  11/17/18   [provider]  glipiZIDE (GLUCOTROL XL) 10 MG 24 hr tablet Take 10 mg by mouth daily with breakfast.  09/26/15   [provider]  memantine (NAMENDA) 10 MG tablet Take 10 mg by mouth 2 (two) times daily.    [provider]  Olmesartan-amLODIPine-HCTZ 20-5-12.5 MG TABS Take 1  tablet by mouth daily.    [provider]  olmesartan-hydrochlorothiazide (BENICAR HCT) 20-12.5 MG tablet Take 1 tablet by mouth daily.    [provider]  Omega-3 1000 MG CAPS Take by mouth.    [provider]  Oxcarbazepine (TRILEPTAL) 300 MG tablet Take 1 tablet (300 mg total) by mouth 2 (two) times daily. 01/11/21   [name], MD  QUEtiapine (SEROQUEL) 100 MG tablet Take 1 tablet (100 mg total) by mouth at bedtime. 12/05/20   [name], MD  QUEtiapine (SEROQUEL) 50 MG tablet Take 1 tablet (50 mg total) by mouth 2 (two) times daily as needed. For severe anxiety and agitation 05/03/20   [name], MD  rosuvastatin (CRESTOR) 20 MG tablet Take 20 mg by mouth daily.  08/29/17   [provider]  vitamin B-12 (CYANOCOBALAMIN) 1000 MCG tablet Take 3,000 mcg by mouth daily.    [provider]     Allergies Patient has no known allergies.  Family History  Problem Relation Age of Onset   Depression Father     Social History Social History   Tobacco Use   Smoking status: Never   Smokeless tobacco: Never  Vaping Use   Vaping Use: Never used  Substance Use Topics   Alcohol use: Yes    Comment: social   Drug use: No    Review of Systems  Constitutional: No fever/chills Eyes: No visual changes.  ENT: No epistaxis Cardiovascular: Denies chest pain. Respiratory: No cough reported Gastrointestinal: No vomiting Genitourinary: Foley catheter Musculoskeletal: Lower extremity swelling, chronic Skin: Negative for rash. Neurological: Negative for headaches    ____________________________________________   PHYSICAL EXAM:  VITAL SIGNS: ED Triage Vitals [02/23/21 1236]  Enc Vitals Group     BP 122/78     Pulse Rate 93     Resp 15     Temp 99 F (37.2 C)     Temp Source Oral     SpO2 98 %     Weight 112.9 kg (249 lb)     Height 1.676 m (5\' 6" )     Head Circumference      Peak Flow      Pain Score 0     Pain Loc       Pain Edu?      Excl. in GC?     Constitutional: Alert No acute distress. Eyes: Conjunctivae are normal.  Head: Atraumatic. Nose: No congestion/rhinnorhea. Mouth/Throat: Mucous membranes are dry Neck:  Painless ROM Cardiovascular: Normal rate, regular rhythm. peripheral circulation. Respiratory: Normal respiratory effort.  No retractions. Lungs CTAB. Gastrointestinal: Soft and nontender. No distention.  No CVA tenderness.  Musculoskeletal: Bilateral lower extremity edema, appears chronic.  Stage IV sacral decubitus ulcer, no significant surrounding erythema, no significant discharge appreciated Neurologic:  Normal speech and language. Skin:  Skin is warm, dry right heel ulceration, no evidence of acute infection at this time, decubitus as above Psychiatric: Mood and affect are normal. Speech and behavior are normal.  ____________________________________________   LABS (all labs ordered are listed, but only abnormal results are displayed)  Labs Reviewed  COMPREHENSIVE METABOLIC PANEL - Abnormal; Notable  for the following components:      Result Value   Sodium 126 (*)    CO2 19 (*)    BUN 26 (*)    Creatinine, Ser 2.01 (*)    Calcium 8.1 (*)    Albumin 2.8 (*)    GFR, Estimated 36 (*)    All other components within normal limits  CBC WITH DIFFERENTIAL/PLATELET - Abnormal; Notable for the following components:   WBC 15.0 (*)    RBC 3.54 (*)    Hemoglobin 9.8 (*)    HCT 30.3 (*)    RDW 17.1 (*)    Neutro Abs 13.0 (*)    Abs Immature Granulocytes 0.09 (*)    All other components within normal limits  CULTURE, BLOOD (SINGLE)  URINE CULTURE  RESP PANEL BY RT-PCR (FLU A&B, COVID) ARPGX2  SARS CORONAVIRUS 2 (TAT 6-24 HRS)  CULTURE, BLOOD (SINGLE)  LACTIC ACID, PLASMA  LACTIC ACID, PLASMA  URINALYSIS, COMPLETE (UACMP) WITH MICROSCOPIC  PROTIME-INR  APTT   ____________________________________________  EKG  ED ECG REPORT I, Jene Every, the attending physician,  personally viewed and interpreted this ECG.  Date: 02/23/2021  Rhythm: normal sinus rhythm QRS Axis: normal Intervals: normal ST/T Wave abnormalities: normal Narrative Interpretation: no evidence of acute ischemia  ____________________________________________  RADIOLOGY  Chest x-ray without evidence of pneumonia ____________________________________________   PROCEDURES  Procedure(s) performed: No  Procedures   Critical Care performed: No ____________________________________________   INITIAL IMPRESSION / ASSESSMENT AND PLAN / ED COURSE  Pertinent labs & imaging results that were available during my care of the patient were reviewed by me and considered in my medical decision making (see chart for details).   Patient presents with generalized weakness, feeling ill.  Afebrile here temperature 99, heart rate 97.  His white blood cell count is significantly elevated however lactic acid is normal.  I suspect possible early infection as the cause of his presentation.  He has multiple potential sources including stage IV sacral decubitus, right heel ulceration, indwelling Foley catheter  Lab work is also consistent with acute kidney injury likely related to dehydration, will give IV fluids.  Will give broad-spectrum antibiotics discussed with the hospitalist service    ____________________________________________   FINAL CLINICAL IMPRESSION(S) / ED DIAGNOSES  Final diagnoses:  Sepsis, due to unspecified organism, unspecified whether acute organ dysfunction present [facility])        Note:  This document was prepared using Dragon voice recognition software and may include unintentional dictation errors.    Jene Every, MD 02/23/21 [phone]

## 2021-02-24 DIAGNOSIS — N390 Urinary tract infection, site not specified: Secondary | ICD-10-CM | POA: Diagnosis not present

## 2021-02-24 DIAGNOSIS — E871 Hypo-osmolality and hyponatremia: Secondary | ICD-10-CM | POA: Diagnosis not present

## 2021-02-24 DIAGNOSIS — N179 Acute kidney failure, unspecified: Secondary | ICD-10-CM

## 2021-02-24 LAB — HEMOGLOBIN A1C
Hgb A1c MFr Bld: 6.9 % — ABNORMAL HIGH (ref 4.8–5.6)
Mean Plasma Glucose: 151.33 mg/dL

## 2021-02-24 LAB — CBC
HCT: 27.7 % — ABNORMAL LOW (ref 39.0–52.0)
Hemoglobin: 9 g/dL — ABNORMAL LOW (ref 13.0–17.0)
MCH: 27.5 pg (ref 26.0–34.0)
MCHC: 32.5 g/dL (ref 30.0–36.0)
MCV: 84.7 fL (ref 80.0–100.0)
Platelets: 215 10*3/uL (ref 150–400)
RBC: 3.27 MIL/uL — ABNORMAL LOW (ref 4.22–5.81)
RDW: 17.1 % — ABNORMAL HIGH (ref 11.5–15.5)
WBC: 11.3 10*3/uL — ABNORMAL HIGH (ref 4.0–10.5)
nRBC: 0 % (ref 0.0–0.2)

## 2021-02-24 LAB — BASIC METABOLIC PANEL
Anion gap: 8 (ref 5–15)
BUN: 23 mg/dL (ref 8–23)
CO2: 20 mmol/L — ABNORMAL LOW (ref 22–32)
Calcium: 8 mg/dL — ABNORMAL LOW (ref 8.9–10.3)
Chloride: 103 mmol/L (ref 98–111)
Creatinine, Ser: 1.82 mg/dL — ABNORMAL HIGH (ref 0.61–1.24)
GFR, Estimated: 41 mL/min — ABNORMAL LOW (ref >60)
Glucose, Bld: 49 mg/dL — ABNORMAL LOW (ref 70–99)
Potassium: 3.8 mmol/L (ref 3.5–5.1)
Sodium: 131 mmol/L — ABNORMAL LOW (ref 135–145)

## 2021-02-24 LAB — CBG MONITORING, ED
Glucose-Capillary: 48 mg/dL — ABNORMAL LOW (ref 70–99)
Glucose-Capillary: 68 mg/dL — ABNORMAL LOW (ref 70–99)
Glucose-Capillary: 90 mg/dL (ref 70–99)
Glucose-Capillary: 96 mg/dL (ref 70–99)
Glucose-Capillary: 46 mg/dL — ABNORMAL LOW (ref 70–99)
Glucose-Capillary: 39 mg/dL — CL (ref 70–99)
Glucose-Capillary: 95 mg/dL (ref 70–99)

## 2021-02-24 LAB — GLUCOSE, CAPILLARY: Glucose-Capillary: 159 mg/dL — ABNORMAL HIGH (ref 70–99)

## 2021-02-24 LAB — PROCALCITONIN: Procalcitonin: 5.7 ng/mL

## 2021-02-24 MED ORDER — SODIUM CHLORIDE 0.9 % IV SOLN: Freq: Once | INTRAVENOUS | Status: AC

## 2021-02-24 MED ORDER — CHLORHEXIDINE GLUCONATE CLOTH 2 % EX PADS
6.0000 | MEDICATED_PAD | Freq: Every day | CUTANEOUS | Status: DC
  Administered 2021-02-24 – 2021-03-03 (×8): 6 via TOPICAL

## 2021-02-24 MED ORDER — DEXTROSE-NACL 5-0.9 % IV SOLN: INTRAVENOUS | Status: DC

## 2021-02-24 MED ORDER — DEXTROSE 50 % IV SOLN
50.0000 mL | Freq: Once | INTRAVENOUS | Status: AC
  Administered 2021-02-24: 50 mL via INTRAVENOUS
  Filled 2021-02-24: qty 50

## 2021-02-24 MED ORDER — SODIUM CHLORIDE 0.9 % IV SOLN: INTRAVENOUS | Status: DC

## 2021-02-24 NOTE — ED Notes (Signed)
Patient given orange juice. MD informed.

## 2021-02-24 NOTE — Consult Note (Signed)
WOC Nurse Consult Note: Reason for Consult:Healing sacral stage 4 wound and right heel ulceration, full thickness wound, newly healed Wound type:Pressure, neuropathic Pressure Injury POA: Yes Measurement:Per Dr. Verl Bangs note on 02/07/21: 4cm x 2.3cm x 4.4cm with undermining from 12-3 o'clock measuring 1.5cm Wound bed: Red and yellow Drainage (amount, consistency, odor) serous, moderate Periwound: With maceration, we will address this with Cavillon skin protectant wipes at each dressing change. Dressing procedure/placement/frequency:I have placed orders for topical care of the right heel and the sacral Pressure injury in accordance with the notes provided by Wound Care Provider Dr. Desiree Hane at [facility] in his visit notes from 02/07/21. The sacral wound will receive daily care with a silver alginate/hydrofiber topped with dry gauze, and ABD and secured with tape. The patient is to turn side to side and minimize time in the supine position. A mattress replacement with low air loss feature is provided despite this being refused in the past.  According to Dr. Verl Bangs last note, the patient and his wife have agreed to this at home because their previous plan of the patient sitting up in the chair for 12 hours and sleeping on a regular Queen mattress has not been working for wound healing. We will reinforce the need for it while here. The patient now uses a Roho pressure redistribution chair cushion in his wheelchair. The right heel wound has been followed by Podiatry at [facility] and was full thickness, documented as a neuropathic (diabetic, not pressure) injury. IT was very small at the last podiatric medicine visit, Dr. Jacolyn Reedy noted it was healed on the 8/10 visit. On assessment today, Dr. Mayford Knife noted it was dry, so protective wound care orders using a xeroform dressing and a bordered silicone foam dressing are provided. Of note, this is consistent with Dr. Verl Bangs POC. Patient has a boot at home that he  is to wear at night. We will float his heel while here. If the patient's family desires, they may bring in the boot from home.  I have communicated the proposed POC for this patient to Dr. Wilfred Lacy and who also included the patient's Bedside RN, A. Cheney via [employer]. Their agreement and assistance is appreciated.  WOC Nurse ostomy consult note Stoma type/location: LLQ Colostomy established 07/2019 Stomal assessment/size: Not seen today Peristomal assessment: Not seen today Treatment options for stomal/peristomal skin: Skin barrier ring Output : Brown stool  Ostomy pouching: 2pc. 2 and 3/4 inch pouching system with skin barrier ring. Pouch is [employer], Skin barrier is [place] # 2 and skin barrier ring is [place] # [phone] Education provided: None provided Enrolled patient in [employer] DC program: No, Patient is established with a provider.   WOC nursing team will not follow, but will remain available to this patient, the nursing and medical teams.  Please re-consult if needed. Thanks, Ladona Mow, MSN, RN, GNP, [place]  Pager# [phone]

## 2021-02-24 NOTE — Progress Notes (Signed)
PROGRESS NOTE    [name]  XVQ:[id number] DOB: Jun 04, 1956 DOA: 02/23/2021 PCP: Dione Booze, MD   Assessment & Plan:   Active Problems:   UTI (urinary tract infection)  Sepsis: meets criteria w/ tachycardia, leukocytosis and likely UTI and possible sacral infection. Given IV cefepime, flagyl & vanco x 1 in ER. Continue on IV unasyn. Blood cx NGTD. Urine cx is pending still. Continue on IVFs. Pro-cal ordered STAT 02/23/21 and it was never done, messaged pt's nurse about this    Possible UTI: UA is abnormal. Urine cx is pending. Continue on IV abxs   Neurogenic bladder: chronic indwelling foley placed this past year. Changed monthly by [facility] RN & likely last changed at the beginning of August 2022 and will need to be changed prior to d/c    Sacral ulcer: possibly infected. Present on admission and unknown stage. Continue w/ wound care    Right heel ulcer: present on admission. Continue w/ wound care    Hyponatremia: possibly secondary dehydration as pt has not eaten or drink anything all day. Na is trending up today. Continue on IVFs   Likely AKI on CKD: baseline Cr/GFR is unknown, currently stage IIIb. Cr is trending down from day prior    HLD: continue on statin    HTN: continue to hold all BP meds    Hx of DVTs: continue on home dose of eliquis    Hx of TBI: continue on home dose of namenda, seroquel. Poor historian    Ostomy bag: present on admission. Unknown reason why it was initially placed. Continue w/ supportive care    DM2: likely poorly controlled. Continue on SSI w/ accuchecks   Leukocytosis: likely secondary to above infection. 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 H&H are labile    Morbidly obese: BMI 40.1. Complicates overall care and prognosis    DVT prophylaxis: eliquis  Code Status: full  Family Communication:  Disposition Plan:  depends on PT/OT recs (not consulted yet)  Status is: Inpatient  Remains inpatient  appropriate because:Unsafe d/c plan, IV treatments appropriate due to intensity of illness or inability to take PO, and Inpatient level of care appropriate due to severity of illness  Dispo: The patient is from: Home              Anticipated d/c is to: Home vs SNF              Patient currently is not medically stable to d/c.   Difficult to place patient : unclear         Level of care: Med-Surg  Consultants:    Procedures:   Antimicrobials: unasyn    Subjective: Pt c/o fatigue  Objective: Vitals:   02/24/21 0300 02/24/21 0400 02/24/21 0500 02/24/21 0700  BP: (!) 83/51 (!) 78/54 (!) 95/58 (!) 99/55  Pulse: 80 84 77 81  Resp: 20 17 18  (!) 24  Temp:      TempSrc:      SpO2: 99% 97% 98% 96%  Weight:      Height:        Intake/Output Summary (Last 24 hours) at 02/24/2021 0816 Last data filed at 02/24/2021 0408 Gross per 24 hour  Intake 120 ml  Output --  Net 120 ml   Filed Weights   02/23/21 1236  Weight: 112.9 kg    Examination:  General exam: Appears calm and comfortable  Respiratory system: Clear to auscultation. Respiratory effort normal. Cardiovascular system: S1 &  S2+. No rubs, gallops or clicks.  Gastrointestinal system: Abdomen is nondistended, soft and nontender, ostomy bag present w/ brown stool. Normal bowel sounds heard. Central nervous system: Alert and oriented. Moves all extremities Psychiatry: Judgement and insight appear normal. Flat mood and affect      Data Reviewed: I have personally reviewed following labs and imaging studies  CBC: Recent Labs  Lab 02/23/21 1255 02/24/21 0723  WBC 15.0* 11.3*  NEUTROABS 13.0*  --   HGB 9.8* 9.0*  HCT 30.3* 27.7*  MCV 85.6 84.7  PLT 220 215   Basic Metabolic Panel: Recent Labs  Lab 02/23/21 1255 02/24/21 0723  NA 126* 131*  K 4.0 3.8  CL 98 103  CO2 19* 20*  GLUCOSE 79 49*  BUN 26* 23  CREATININE 2.01* 1.82*  CALCIUM 8.1* 8.0*   GFR: Estimated Creatinine Clearance: 47.7 mL/min  (A) (by C-G formula based on SCr of 1.82 mg/dL (H)). Liver Function Tests: Recent Labs  Lab 02/23/21 1255  AST 16  ALT 10  ALKPHOS 64  BILITOT 0.7  PROT 7.9  ALBUMIN 2.8*   No results for input(s): LIPASE, AMYLASE in the last 168 hours. No results for input(s): AMMONIA in the last 168 hours. Coagulation Profile: No results for input(s): INR, PROTIME in the last 168 hours. Cardiac Enzymes: No results for input(s): CKTOTAL, CKMB, CKMBINDEX, TROPONINI in the last 168 hours. BNP (last 3 results) No results for input(s): PROBNP in the last 8760 hours. HbA1C: No results for input(s): HGBA1C in the last 72 hours. CBG: Recent Labs  Lab 02/23/21 2335  GLUCAP 76   Lipid Profile: No results for input(s): CHOL, HDL, LDLCALC, TRIG, CHOLHDL, LDLDIRECT in the last 72 hours. Thyroid Function Tests: No results for input(s): TSH, T4TOTAL, FREET4, T3FREE, THYROIDAB in the last 72 hours. Anemia Panel: No results for input(s): VITAMINB12, FOLATE, FERRITIN, TIBC, IRON, RETICCTPCT in the last 72 hours. Sepsis Labs: Recent Labs  Lab 02/23/21 1255  LATICACIDVEN 1.2    Recent Results (from the past 240 hour(s))  Blood culture (routine single)     Status: None (Preliminary result)   Collection Time: 02/23/21 12:55 PM   Specimen: BLOOD  Result Value Ref Range Status   Specimen Description BLOOD LEFT ANTECUBITAL  Final   Special Requests   Final    BOTTLES DRAWN AEROBIC AND ANAEROBIC Blood Culture adequate volume   Culture   Final    NO GROWTH < 24 HOURS Performed at [facility], [address], [place], [place] [address]    Report Status PENDING  Incomplete  Resp Panel by RT-PCR (Flu A&B, Covid) Nasopharyngeal Swab     Status: None   Collection Time: 02/23/21  2:34 PM   Specimen: Nasopharyngeal Swab; Nasopharyngeal(NP) swabs in vial transport medium  Result Value Ref Range Status   SARS Coronavirus 2 by RT PCR NEGATIVE NEGATIVE Final    Comment: (NOTE) SARS-CoV-2 target  nucleic acids are NOT DETECTED.  The SARS-CoV-2 RNA is generally detectable in upper respiratory specimens during the acute phase of infection. The lowest concentration of SARS-CoV-2 viral copies this assay can detect is 138 copies/mL. A negative result does not preclude SARS-Cov-2 infection and should not be used as the sole basis for treatment or other patient management decisions. A negative result may occur with  improper specimen collection/handling, submission of specimen other than nasopharyngeal swab, presence of viral mutation(s) within the areas targeted by this assay, and inadequate number of viral copies(<138 copies/mL). A negative result must be  combined with clinical observations, patient history, and epidemiological information. The expected result is Negative.  Fact Sheet for Patients:  BloggerCourse.com  Fact Sheet for Healthcare Providers:  SeriousBroker.it  This test is no t yet approved or cleared by the [place] FDA and  has been authorized for detection and/or diagnosis of SARS-CoV-2 by FDA under an Emergency Use Authorization (EUA). This EUA will remain  in effect (meaning this test can be used) for the duration of the COVID-19 declaration under Section 564(b)(1) of the Act, 21 U.S.C.section 360bbb-3(b)(1), unless the authorization is terminated  or revoked sooner.       Influenza A by PCR NEGATIVE NEGATIVE Final   Influenza B by PCR NEGATIVE NEGATIVE Final    Comment: (NOTE) The Xpert Xpress SARS-CoV-2/FLU/RSV plus assay is intended as an aid in the diagnosis of influenza from Nasopharyngeal swab specimens and should not be used as a sole basis for treatment. Nasal washings and aspirates are unacceptable for Xpert Xpress SARS-CoV-2/FLU/RSV testing.  Fact Sheet for Patients: BloggerCourse.com  Fact Sheet for Healthcare  Providers: SeriousBroker.it  This test is not yet approved or cleared by the [place] FDA and has been authorized for detection and/or diagnosis of SARS-CoV-2 by FDA under an Emergency Use Authorization (EUA). This EUA will remain in effect (meaning this test can be used) for the duration of the COVID-19 declaration under Section 564(b)(1) of the Act, 21 U.S.C. section 360bbb-3(b)(1), unless the authorization is terminated or revoked.  Performed at [facility], [address] [place], [place] [address]   Culture, blood (single)     Status: None (Preliminary result)   Collection Time: 02/23/21  3:25 PM   Specimen: BLOOD  Result Value Ref Range Status   Specimen Description BLOOD BLOOD RIGHT HAND  Final   Special Requests   Final    BOTTLES DRAWN AEROBIC AND ANAEROBIC Blood Culture results may not be optimal due to an inadequate volume of blood received in culture bottles   Culture   Final    NO GROWTH < 24 HOURS Performed at [facility], [address], [place], [place] [address]    Report Status PENDING  Incomplete         Radiology Studies: DG Chest Port 1 View  Result Date: 02/23/2021 CLINICAL DATA:  Sepsis EXAM: PORTABLE CHEST 1 VIEW COMPARISON:  Chest x-ray dated March 18, 2020 FINDINGS: Cardiac and mediastinal contours are unchanged and within normal limits. No focal consolidation. No large pleural effusion or pneumothorax. IMPRESSION: No acute cardiopulmonary abnormality. Electronically Signed   By: Allegra Lai M.D.   On: 02/23/2021 14:33        Scheduled Meds:  apixaban  5 mg Oral BID   insulin aspart  0-9 Units Subcutaneous TID WC   QUEtiapine  100 mg Oral QHS   rosuvastatin  20 mg Oral Daily   sodium chloride flush  3 mL Intravenous Q12H   Continuous Infusions:  sodium chloride 100 mL/hr at 02/24/21 0532   ampicillin-sulbactam (UNASYN) IV Stopped (02/24/21 0517)   lactated ringers Stopped  (02/24/21 0422)     LOS: 1 day    Time spent: 33 mins     Charise Killian, MD [facility] Pager [phone]  If 7PM-7AM, please contact night-coverage 02/24/2021, 8:16 AM

## 2021-02-24 NOTE — ED Notes (Signed)
Pt given 8oz of orange juice 

## 2021-02-24 NOTE — ED Notes (Signed)
Called for transport

## 2021-02-24 NOTE — ED Notes (Signed)
Patient given lunch tray. Meat was cut up for the patient.

## 2021-02-24 NOTE — ED Notes (Signed)
Lab called to draw specimens.

## 2021-02-24 NOTE — ED Notes (Signed)
Ostomy site assessed.  Bag is clean and empty.  Stoma looks beefy red.

## 2021-02-24 NOTE — ED Notes (Signed)
Lab at bedside

## 2021-02-24 NOTE — ED Notes (Signed)
Lab tech was unable to get labs and will send someone else.

## 2021-02-24 NOTE — ED Notes (Signed)
Lab is at bedside.

## 2021-02-25 DIAGNOSIS — N179 Acute kidney failure, unspecified: Secondary | ICD-10-CM | POA: Diagnosis not present

## 2021-02-25 DIAGNOSIS — N390 Urinary tract infection, site not specified: Secondary | ICD-10-CM | POA: Diagnosis not present

## 2021-02-25 DIAGNOSIS — E871 Hypo-osmolality and hyponatremia: Secondary | ICD-10-CM | POA: Diagnosis not present

## 2021-02-25 LAB — CBC
HCT: 28.2 % — ABNORMAL LOW (ref 39.0–52.0)
Hemoglobin: 9.1 g/dL — ABNORMAL LOW (ref 13.0–17.0)
MCH: 27.1 pg (ref 26.0–34.0)
MCHC: 32.3 g/dL (ref 30.0–36.0)
MCV: 83.9 fL (ref 80.0–100.0)
Platelets: 207 10*3/uL (ref 150–400)
RBC: 3.36 MIL/uL — ABNORMAL LOW (ref 4.22–5.81)
RDW: 17.3 % — ABNORMAL HIGH (ref 11.5–15.5)
WBC: 8 10*3/uL (ref 4.0–10.5)
nRBC: 0 % (ref 0.0–0.2)

## 2021-02-25 LAB — BASIC METABOLIC PANEL
Anion gap: 5 (ref 5–15)
BUN: 17 mg/dL (ref 8–23)
CO2: 21 mmol/L — ABNORMAL LOW (ref 22–32)
Calcium: 8.1 mg/dL — ABNORMAL LOW (ref 8.9–10.3)
Chloride: 107 mmol/L (ref 98–111)
Creatinine, Ser: 1.43 mg/dL — ABNORMAL HIGH (ref 0.61–1.24)
GFR, Estimated: 54 mL/min — ABNORMAL LOW (ref >60)
Glucose, Bld: 187 mg/dL — ABNORMAL HIGH (ref 70–99)
Potassium: 4.1 mmol/L (ref 3.5–5.1)
Sodium: 133 mmol/L — ABNORMAL LOW (ref 135–145)

## 2021-02-25 LAB — GLUCOSE, CAPILLARY
Glucose-Capillary: 164 mg/dL — ABNORMAL HIGH (ref 70–99)
Glucose-Capillary: 177 mg/dL — ABNORMAL HIGH (ref 70–99)
Glucose-Capillary: 138 mg/dL — ABNORMAL HIGH (ref 70–99)
Glucose-Capillary: 122 mg/dL — ABNORMAL HIGH (ref 70–99)

## 2021-02-25 LAB — HIV ANTIBODY (ROUTINE TESTING W REFLEX): HIV Screen 4th Generation wRfx: NONREACTIVE

## 2021-02-25 NOTE — Evaluation (Signed)
Occupational Therapy Evaluation Patient Details Name: [name] MRN: [id number] DOB: December 17, 1955 Today's Date: 02/25/2021    History of Present Illness Pt is a 65 y/o M admitted on 02/23/21 for tx of UTI after presenting with c/o weakness x 1 week. PMH: TBI, neurogenic bladder with chronic indwelling foley, ostomy bag, morbidly obese, DM2, HTN, HLD, DVT on eliquis, bipolar disorder   Clinical Impression   Pt seen for OT evaluation this date. Pt A&Ox3 and agreeable to OT evaluation. At baseline, pt reports he is able to perform UB ADLs following set-up assist and transfer bed<> wheelchair with assist from wife. Pt reports that his spouse assists with LB dressing/bathing, ostomy bag management, and IADLs. Nurse assists with foley care 1x/month. Plan to confirm PLOF with wife when able. Pt endorses decreased strength compared to baseline and currently presents with decreased balance and activity tolerance. Due to these functional impairments, pt requires MOD-MAX A for bed mobility, SUPERVISION/SET-UP for grooming tasks while seated EOB, and MAX A for LB ADLs. Pt would benefit from additional skilled OT services to maximize return to PLOF and minimize risk of future falls, injury, caregiver burden, and readmission. Upon discharge, recommend SNF.      Follow Up Recommendations  SNF    Equipment Recommendations  Other (comment) (defer to next venue of care)       Precautions / Restrictions Precautions Precautions: Fall Precaution Comments: ostomy bag, chronic foley, sacral wound Restrictions Weight Bearing Restrictions: No      Mobility Bed Mobility Overal bed mobility: Needs Assistance Bed Mobility: Sit to Supine;Supine to Sit     Supine to sit: Max assist;HOB elevated Sit to supine: Mod assist;HOB elevated   General bed mobility comments: Use of bed rails. Requires total assist +2 to scoot to [facility]               Balance Overall balance assessment: Needs  assistance Sitting-balance support: Feet unsupported;Bilateral upper extremity supported Sitting balance-Leahy Scale: Fair Sitting balance - Comments: With bed deflated, pt able to maintain sitting balance EOB without assist to perform grooming task                                   ADL either performed or assessed with clinical judgement   ADL Overall ADL's : Needs assistance/impaired     Grooming: Wash/dry face;Wash/dry hands;Oral care;Supervision/safety;Set up;Sitting       Lower Body Bathing: Maximal assistance;Sitting/lateral leans                                Pertinent Vitals/Pain Pain Assessment: No/denies pain        Extremity/Trunk Assessment Upper Extremity Assessment Upper Extremity Assessment: Generalized weakness   Lower Extremity Assessment Lower Extremity Assessment: Generalized weakness   Cervical / Trunk Assessment Cervical / Trunk Assessment: Kyphotic   Communication Communication Communication: HOH (mildly)   Cognition Arousal/Alertness: Awake/alert Behavior During Therapy: WFL for tasks assessed/performed Overall Cognitive Status: No family/caregiver present to determine baseline cognitive functioning                                 General Comments: Alert and oriented to self, location, and date. Disoriented to situation. Able to follow 1-step commands consistently              Home Living  Family/patient expects to be discharged to:: Private residence Living Arrangements: Spouse/significant other Available Help at Discharge: Family;Available PRN/intermittently Type of Home: House Home Access: Stairs to enter Entrance Stairs-Number of Steps: 1+1 Entrance Stairs-Rails: None Home Layout: One level           Bathroom Accessibility: Yes   Home Equipment: Walker - 2 wheels;Shower seat;Wheelchair - manual          Prior Functioning/Environment Level of Independence: Needs assistance  Gait  / Transfers Assistance Needed: Pt reports that he is able to transfer bed<>WC with assis from wife. ADL's / Homemaking Assistance Needed: States that his spouse assists in med mgt, cooks and cleans, helps pt with LB dressing and some aspects of bathing. Nurse to assist with foley care 1x/month   Comments: Nurse to assist with foley care 1x/month, pt reports he ambulates very short household distances with RW, PRN use of w/c but also reports he's home alone during the day        OT Problem List: Decreased activity tolerance;Impaired balance (sitting and/or standing);Decreased strength      OT Treatment/Interventions: Self-care/ADL training;Therapeutic exercise;Therapeutic activities;Patient/family education;Balance training    OT Goals(Current goals can be found in the care plan section) Acute Rehab OT Goals Patient Stated Goal: to get stronger OT Goal Formulation: With patient Time For Goal Achievement: 03/11/21 Potential to Achieve Goals: Fair ADL Goals Pt Will Perform Grooming: with modified independence;sitting Pt Will Perform Upper Body Bathing: with set-up;sitting Pt Will Perform Upper Body Dressing: with set-up;sitting  OT Frequency: Min 1X/week    AM-PAC OT "6 Clicks" Daily Activity     Outcome Measure Help from another person eating meals?: None Help from another person taking care of personal grooming?: A Little Help from another person toileting, which includes using toliet, bedpan, or urinal?: Total Help from another person bathing (including washing, rinsing, drying)?: A Lot Help from another person to put on and taking off regular upper body clothing?: A Little Help from another person to put on and taking off regular lower body clothing?: A Lot 6 Click Score: 15   End of Session Nurse Communication: Mobility status  Activity Tolerance: Patient tolerated treatment well Patient left: in bed;with call bell/phone within reach;with bed alarm set;with nursing/sitter in  room  OT Visit Diagnosis: Muscle weakness (generalized) (M62.81)                Time: 6568-1275 OT Time Calculation (min): 37 min Charges:  OT General Charges $OT Visit: 1 Visit OT Evaluation $OT Eval Moderate Complexity: 1 Mod OT Treatments $Self Care/Home Management : 8-22 mins $Therapeutic Activity: 8-22 mins  Matthew Folks, OTR/L [facility] [phone]

## 2021-02-25 NOTE — Progress Notes (Signed)
PROGRESS NOTE    [name]  TDD:[id number] DOB: 1956/03/18 DOA: 02/23/2021 PCP: Dione Booze, MD   Assessment & Plan:   Active Problems:   UTI (urinary tract infection)  Sepsis: meets criteria w/ tachycardia, leukocytosis and likely UTI and possible sacral infection. Given IV cefepime, flagyl & vanco x 1 in ER. Continue on IV unasyn. Blood cxs NGTD. Urine cx growing gram neg rods. D/c IVFs. Pro-cal 5.70. Resolved   Possible UTI: UA is abnormal. Urine cx is growing gram neg rods. Continue on IV unasyn  Neurogenic bladder: chronic indwelling foley placed this past year. Changed monthly by [facility] RN & likely last changed at the beginning of August 2022 and will need to be changed prior to d/c    Sacral ulcer: possibly infected. Present on admission and unknown stage. Continue w/ wound care    Right heel ulcer: present on admission. Continue w/ wound care    Hyponatremia: trending up, almost WNL.    Likely AKI on CKD: baseline Cr/GFR is unknown, currently stage IIIa. Cr is trending down from day prior.   HLD: continue on statin    HTN: will restart home dose of HCTZ-olmesartan tomorrow   Hx of DVTs: continue on home dose of eliquis    Hx of TBI: continue on home dose of namenda, seroquel. Poor historian    Ostomy bag: present on admission. Unknown reason why it was initially placed. Continue w/ supportive care    DM2: likely poorly controlled. 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 H&H are stable    Morbidly obese: BMI 40.1. Complicates overall care and prognosis   DVT prophylaxis: eliquis  Code Status: full  Family Communication: called pt's wife, [name], no answer but left a message Disposition Plan:  depends on PT/OT recs   Status is: Inpatient  Remains inpatient appropriate because:Unsafe d/c plan, IV treatments appropriate due to intensity of illness or inability to take PO, and Inpatient level of  care appropriate due to severity of illness  Dispo: The patient is from: Home              Anticipated d/c is to: Home vs SNF              Patient currently is not medically stable to d/c.   Difficult to place patient : unclear         Level of care: Med-Surg  Consultants:    Procedures:   Antimicrobials: unasyn    Subjective: Pt c/o malaise  Objective: Vitals:   02/24/21 1600 02/24/21 1817 02/24/21 2018 02/25/21 0505  BP: 108/71 127/85 140/85 132/77  Pulse: 79 81 79 78  Resp: 15 20 15 18   Temp:  98.7 F (37.1 C) 97.6 F (36.4 C) 98.1 F (36.7 C)  TempSrc:  Oral Oral   SpO2: 100% 100% 100% 100%  Weight:      Height:        Intake/Output Summary (Last 24 hours) at 02/25/2021 0756 Last data filed at 02/25/2021 0610 Gross per 24 hour  Intake 2694.37 ml  Output 1600 ml  Net 1094.37 ml   Filed Weights   02/23/21 1236  Weight: 112.9 kg    Examination:  General exam: Appears comfortable  Respiratory system: Clear breath sounds b/l Cardiovascular system: S1/S2+. No rubs or clicks  Gastrointestinal system: Abd is soft, NT, obese, ostomy bag present w/ brown stool, normal bowel sounds  Central nervous system: Alert and oriented.  Moves all extremities  Psychiatry: Judgement and insight appear normal. Flat mood and affect     Data Reviewed: I have personally reviewed following labs and imaging studies  CBC: Recent Labs  Lab 02/23/21 1255 02/24/21 0723 02/25/21 0440  WBC 15.0* 11.3* 8.0  NEUTROABS 13.0*  --   --   HGB 9.8* 9.0* 9.1*  HCT 30.3* 27.7* 28.2*  MCV 85.6 84.7 83.9  PLT 220 215 207   Basic Metabolic Panel: Recent Labs  Lab 02/23/21 1255 02/24/21 0723 02/25/21 0440  NA 126* 131* 133*  K 4.0 3.8 4.1  CL 98 103 107  CO2 19* 20* 21*  GLUCOSE 79 49* 187*  BUN 26* 23 17  CREATININE 2.01* 1.82* 1.43*  CALCIUM 8.1* 8.0* 8.1*   GFR: Estimated Creatinine Clearance: 60.8 mL/min (A) (by C-G formula based on SCr of 1.43 mg/dL (H)). Liver  Function Tests: Recent Labs  Lab 02/23/21 1255  AST 16  ALT 10  ALKPHOS 64  BILITOT 0.7  PROT 7.9  ALBUMIN 2.8*   No results for input(s): LIPASE, AMYLASE in the last 168 hours. No results for input(s): AMMONIA in the last 168 hours. Coagulation Profile: No results for input(s): INR, PROTIME in the last 168 hours. Cardiac Enzymes: No results for input(s): CKTOTAL, CKMB, CKMBINDEX, TROPONINI in the last 168 hours. BNP (last 3 results) No results for input(s): PROBNP in the last 8760 hours. HbA1C: Recent Labs    02/24/21 0723  HGBA1C 6.9*   CBG: Recent Labs  Lab 02/24/21 1217 02/24/21 1615 02/24/21 1633 02/24/21 1708 02/24/21 2300  GLUCAP 96 46* 39* 95 159*   Lipid Profile: No results for input(s): CHOL, HDL, LDLCALC, TRIG, CHOLHDL, LDLDIRECT in the last 72 hours. Thyroid Function Tests: No results for input(s): TSH, T4TOTAL, FREET4, T3FREE, THYROIDAB in the last 72 hours. Anemia Panel: No results for input(s): VITAMINB12, FOLATE, FERRITIN, TIBC, IRON, RETICCTPCT in the last 72 hours. Sepsis Labs: Recent Labs  Lab 02/23/21 0723 02/23/21 1255  PROCALCITON 5.70  --   LATICACIDVEN  --  1.2    Recent Results (from the past 240 hour(s))  Blood culture (routine single)     Status: None (Preliminary result)   Collection Time: 02/23/21 12:55 PM   Specimen: BLOOD  Result Value Ref Range Status   Specimen Description BLOOD LEFT ANTECUBITAL  Final   Special Requests   Final    BOTTLES DRAWN AEROBIC AND ANAEROBIC Blood Culture adequate volume   Culture   Final    NO GROWTH 2 DAYS Performed at [facility], [address], [place], [place] [address]    Report Status PENDING  Incomplete  Resp Panel by RT-PCR (Flu A&B, Covid) Nasopharyngeal Swab     Status: None   Collection Time: 02/23/21  2:34 PM   Specimen: Nasopharyngeal Swab; Nasopharyngeal(NP) swabs in vial transport medium  Result Value Ref Range Status   SARS Coronavirus 2 by RT PCR NEGATIVE  NEGATIVE Final    Comment: (NOTE) SARS-CoV-2 target nucleic acids are NOT DETECTED.  The SARS-CoV-2 RNA is generally detectable in upper respiratory specimens during the acute phase of infection. The lowest concentration of SARS-CoV-2 viral copies this assay can detect is 138 copies/mL. A negative result does not preclude SARS-Cov-2 infection and should not be used as the sole basis for treatment or other patient management decisions. A negative result may occur with  improper specimen collection/handling, submission of specimen other than nasopharyngeal swab, presence of viral mutation(s) within the areas targeted by this  assay, and inadequate number of viral copies(<138 copies/mL). A negative result must be combined with clinical observations, patient history, and epidemiological information. The expected result is Negative.  Fact Sheet for Patients:  BloggerCourse.com  Fact Sheet for Healthcare Providers:  SeriousBroker.it  This test is no t yet approved or cleared by the [place] FDA and  has been authorized for detection and/or diagnosis of SARS-CoV-2 by FDA under an Emergency Use Authorization (EUA). This EUA will remain  in effect (meaning this test can be used) for the duration of the COVID-19 declaration under Section 564(b)(1) of the Act, 21 U.S.C.section 360bbb-3(b)(1), unless the authorization is terminated  or revoked sooner.       Influenza A by PCR NEGATIVE NEGATIVE Final   Influenza B by PCR NEGATIVE NEGATIVE Final    Comment: (NOTE) The Xpert Xpress SARS-CoV-2/FLU/RSV plus assay is intended as an aid in the diagnosis of influenza from Nasopharyngeal swab specimens and should not be used as a sole basis for treatment. Nasal washings and aspirates are unacceptable for Xpert Xpress SARS-CoV-2/FLU/RSV testing.  Fact Sheet for Patients: BloggerCourse.com  Fact Sheet for Healthcare  Providers: SeriousBroker.it  This test is not yet approved or cleared by the [place] FDA and has been authorized for detection and/or diagnosis of SARS-CoV-2 by FDA under an Emergency Use Authorization (EUA). This EUA will remain in effect (meaning this test can be used) for the duration of the COVID-19 declaration under Section 564(b)(1) of the Act, 21 U.S.C. section 360bbb-3(b)(1), unless the authorization is terminated or revoked.  Performed at [facility], [address] [place], [place] [address]   Culture, blood (single)     Status: None (Preliminary result)   Collection Time: 02/23/21  3:25 PM   Specimen: BLOOD  Result Value Ref Range Status   Specimen Description BLOOD BLOOD RIGHT HAND  Final   Special Requests   Final    BOTTLES DRAWN AEROBIC AND ANAEROBIC Blood Culture results may not be optimal due to an inadequate volume of blood received in culture bottles   Culture   Final    NO GROWTH 2 DAYS Performed at [facility], [address] [place], [place] [address]    Report Status PENDING  Incomplete         Radiology Studies: DG Chest Port 1 View  Result Date: 02/23/2021 CLINICAL DATA:  Sepsis EXAM: PORTABLE CHEST 1 VIEW COMPARISON:  Chest x-ray dated March 18, 2020 FINDINGS: Cardiac and mediastinal contours are unchanged and within normal limits. No focal consolidation. No large pleural effusion or pneumothorax. IMPRESSION: No acute cardiopulmonary abnormality. Electronically Signed   By: Allegra Lai M.D.   On: 02/23/2021 14:33        Scheduled Meds:  apixaban  5 mg Oral BID   Chlorhexidine Gluconate Cloth  6 each Topical Daily   insulin aspart  0-9 Units Subcutaneous TID WC   QUEtiapine  100 mg Oral QHS   rosuvastatin  20 mg Oral Daily   sodium chloride flush  3 mL Intravenous Q12H   Continuous Infusions:  ampicillin-sulbactam (UNASYN) IV Stopped (02/25/21 0530)   dextrose 5 % and 0.9% NaCl  100 mL/hr at 02/25/21 0610     LOS: 2 days    Time spent: 31 mins     Charise Killian, MD [facility] Pager [phone]  If 7PM-7AM, please contact night-coverage 02/25/2021, 7:56 AM

## 2021-02-25 NOTE — Evaluation (Signed)
Physical Therapy Evaluation Patient Details Name: [name] MRN: [id number] DOB: 11/18/1955 Today's Date: 02/25/2021   History of Present Illness  Pt is a 65 y/o M admitted on 02/23/21 for tx of UTI after presenting with c/o weakness x 1 week. PMH: TBI, neurogenic bladder with chronic indwelling foley, ostomy bag, morbidly obese, DM2, HTN, HLD, DVT on eliquis, bipolar disorder  Clinical Impression  Pt seen for PT evaluation with pt agreeable to tx. Pt AxOx3 but unsure if he provided accurate PLOF as pt reports he was ambulatory, albeit short distances. On this date, pt requires mod<>max assist for supine<>sit with pt demonstrating increasing posterior lean in sitting. Pt does endorse fear of falling during mobility, and session also impacted by bariatric bed even in lowest setting with mattress deflated is very much elevated & pt unable to touch feet to floor. Pt assisted back to bed & nurse called to assess foley catheter as catheter not consistently draining & pt noted to have urine coming out of penis. Pt is agreeable to going to rehab upon d/c. Pt would benefit from STR upon d/c to maximize independence with functional mobility, reduce fall risk, & decrease caregiver burden prior to return home.     Follow Up Recommendations SNF    Equipment Recommendations   (TBD in next venue)    Recommendations for Other Services       Precautions / Restrictions Precautions Precautions: Fall Precaution Comments: ostomy bag, chronic foley, sacral wound Restrictions Weight Bearing Restrictions: No      Mobility  Bed Mobility Overal bed mobility: Needs Assistance Bed Mobility: Sit to Supine;Supine to Sit     Supine to sit: Max assist;HOB elevated Sit to supine: Mod assist;HOB elevated   General bed mobility comments: use of bed rails, pt endorses fear of falling off EOB, requires total assist +2 to scoot to [facility]    Transfers                    Ambulation/Gait                 Stairs            Wheelchair Mobility    Modified Rankin (Stroke Patients Only)       Balance Overall balance assessment: Needs assistance Sitting-balance support: Feet unsupported;Bilateral upper extremity supported Sitting balance-Leahy Scale: Poor Sitting balance - Comments: increasing posterior lean, cuing for anterior shift but poor demo despite multimodal cuing                                     Pertinent Vitals/Pain Pain Assessment: No/denies pain    Home Living Family/patient expects to be discharged to:: Private residence Living Arrangements: Spouse/significant other Available Help at Discharge: Family;Available PRN/intermittently Type of Home: House Home Access: Stairs to enter Entrance Stairs-Rails: None Entrance Stairs-Number of Steps: 1+1 Home Layout: One level Home Equipment: Walker - 2 wheels;Shower seat;Wheelchair - manual      Prior Function Level of Independence: Needs assistance         Comments: Nurse to assist with foley care 1x/month, pt reports he ambulates very short household distances with RW, PRN use of w/c but also reports he's home alone during the day     Hand Dominance        Extremity/Trunk Assessment   Upper Extremity Assessment Upper Extremity Assessment: Generalized weakness    Lower Extremity Assessment Lower Extremity  Assessment: Generalized weakness    Cervical / Trunk Assessment Cervical / Trunk Assessment: Kyphotic  Communication   Communication: HOH (mildly)  Cognition Arousal/Alertness: Awake/alert Behavior During Therapy: WFL for tasks assessed/performed Overall Cognitive Status: Within Functional Limits for tasks assessed                                 General Comments: AxOx self, location, date (year), unsure if pt is accurate with PLOF information      General Comments      Exercises     Assessment/Plan    PT Assessment Patient needs continued PT  services  PT Problem List Decreased strength;Decreased mobility;Decreased safety awareness;Decreased activity tolerance;Decreased balance       PT Treatment Interventions DME instruction;Therapeutic activities;Modalities;Gait training;Therapeutic exercise;Patient/family education;Stair training;Balance training;Functional mobility training;Neuromuscular re-education;Manual techniques;Wheelchair mobility training    PT Goals (Current goals can be found in the Care Plan section)  Acute Rehab PT Goals Patient Stated Goal: get better, go to rehab PT Goal Formulation: With patient Time For Goal Achievement: 03/11/21 Potential to Achieve Goals: Good    Frequency Min 2X/week   Barriers to discharge Decreased caregiver support;Inaccessible home environment      Co-evaluation               AM-PAC PT "6 Clicks" Mobility  Outcome Measure Help needed turning from your back to your side while in a flat bed without using bedrails?: A Lot Help needed moving from lying on your back to sitting on the side of a flat bed without using bedrails?: Total Help needed moving to and from a bed to a chair (including a wheelchair)?: Total Help needed standing up from a chair using your arms (e.g., wheelchair or bedside chair)?: A Lot Help needed to walk in hospital room?: Total Help needed climbing 3-5 steps with a railing? : Total 6 Click Score: 8    End of Session   Activity Tolerance: Patient tolerated treatment well Patient left: in bed;with nursing/sitter in room Nurse Communication: Mobility status PT Visit Diagnosis: Muscle weakness (generalized) (M62.81);Difficulty in walking, not elsewhere classified (R26.2)    Time: 1110-1130 PT Time Calculation (min) (ACUTE ONLY): 20 min   Charges:   PT Evaluation $PT Eval High Complexity: 1 High          Aleda Grana, PT, DPT 02/25/21, 12:36 PM   Sandi Mariscal 02/25/2021, 12:33 PM

## 2021-02-26 DIAGNOSIS — L89154 Pressure ulcer of sacral region, stage 4: Secondary | ICD-10-CM | POA: Diagnosis not present

## 2021-02-26 DIAGNOSIS — E871 Hypo-osmolality and hyponatremia: Secondary | ICD-10-CM | POA: Diagnosis not present

## 2021-02-26 DIAGNOSIS — N390 Urinary tract infection, site not specified: Secondary | ICD-10-CM | POA: Diagnosis not present

## 2021-02-26 LAB — CBC
HCT: 27.2 % — ABNORMAL LOW (ref 39.0–52.0)
Hemoglobin: 8.8 g/dL — ABNORMAL LOW (ref 13.0–17.0)
MCH: 27.6 pg (ref 26.0–34.0)
MCHC: 32.4 g/dL (ref 30.0–36.0)
MCV: 85.3 fL (ref 80.0–100.0)
Platelets: 228 10*3/uL (ref 150–400)
RBC: 3.19 MIL/uL — ABNORMAL LOW (ref 4.22–5.81)
RDW: 17 % — ABNORMAL HIGH (ref 11.5–15.5)
WBC: 6.7 10*3/uL (ref 4.0–10.5)
nRBC: 0 % (ref 0.0–0.2)

## 2021-02-26 LAB — GLUCOSE, CAPILLARY
Glucose-Capillary: 137 mg/dL — ABNORMAL HIGH (ref 70–99)
Glucose-Capillary: 143 mg/dL — ABNORMAL HIGH (ref 70–99)
Glucose-Capillary: 166 mg/dL — ABNORMAL HIGH (ref 70–99)
Glucose-Capillary: 151 mg/dL — ABNORMAL HIGH (ref 70–99)

## 2021-02-26 LAB — BASIC METABOLIC PANEL
Anion gap: 3 — ABNORMAL LOW (ref 5–15)
BUN: 13 mg/dL (ref 8–23)
CO2: 21 mmol/L — ABNORMAL LOW (ref 22–32)
Calcium: 8.1 mg/dL — ABNORMAL LOW (ref 8.9–10.3)
Chloride: 109 mmol/L (ref 98–111)
Creatinine, Ser: 1.44 mg/dL — ABNORMAL HIGH (ref 0.61–1.24)
GFR, Estimated: 54 mL/min — ABNORMAL LOW (ref >60)
Glucose, Bld: 140 mg/dL — ABNORMAL HIGH (ref 70–99)
Potassium: 4.1 mmol/L (ref 3.5–5.1)
Sodium: 133 mmol/L — ABNORMAL LOW (ref 135–145)

## 2021-02-26 LAB — URINE CULTURE: Culture: >=100000 — AB

## 2021-02-26 MED ORDER — OXCARBAZEPINE 300 MG PO TABS
300.0000 mg | ORAL_TABLET | Freq: Two times a day (BID) | ORAL | Status: DC
  Administered 2021-02-26 – 2021-03-03 (×11): 300 mg via ORAL
  Filled 2021-02-26 (×13): qty 1

## 2021-02-26 MED ORDER — CIPROFLOXACIN HCL 500 MG PO TABS
500.0000 mg | ORAL_TABLET | Freq: Two times a day (BID) | ORAL | Status: AC
  Administered 2021-02-26 – 2021-03-01 (×6): 500 mg via ORAL
  Filled 2021-02-26 (×6): qty 1

## 2021-02-26 MED ORDER — HYDROCHLOROTHIAZIDE 12.5 MG PO CAPS
12.5000 mg | ORAL_CAPSULE | Freq: Every day | ORAL | Status: DC
  Administered 2021-02-26 – 2021-02-28 (×3): 12.5 mg via ORAL
  Filled 2021-02-26 (×3): qty 1

## 2021-02-26 MED ORDER — MEMANTINE HCL 5 MG PO TABS
10.0000 mg | ORAL_TABLET | Freq: Two times a day (BID) | ORAL | Status: DC
  Administered 2021-02-26 – 2021-03-03 (×11): 10 mg via ORAL
  Filled 2021-02-26 (×11): qty 2

## 2021-02-26 MED ORDER — AMLODIPINE BESYLATE 5 MG PO TABS
5.0000 mg | ORAL_TABLET | Freq: Every day | ORAL | Status: DC
  Administered 2021-02-26 – 2021-03-03 (×6): 5 mg via ORAL
  Filled 2021-02-26 (×6): qty 1

## 2021-02-26 MED ORDER — IRBESARTAN 150 MG PO TABS
150.0000 mg | ORAL_TABLET | Freq: Every day | ORAL | Status: DC
  Administered 2021-02-26 – 2021-03-01 (×4): 150 mg via ORAL
  Filled 2021-02-26 (×4): qty 1

## 2021-02-26 NOTE — [place] FL2 (Signed)
[place] MEDICAID FL2 LEVEL OF CARE SCREENING TOOL     IDENTIFICATION  Patient Name: [name] Birthdate: 01-Mar-1956 Sex: male Admission Date (Current Location): 02/23/2021  [facility] and [place] Number:  Chiropodist and Address:         Provider Number: [id number]  Attending Physician Name and Address:  Charise Killian, MD  Relative Name and Phone Number:       Current Level of Care: Hospital Recommended Level of Care: Skilled Nursing Facility Prior Approval Number:    Date Approved/Denied:   PASRR Number: [id number]  Discharge Plan: SNF    Current Diagnoses: Patient Active Problem List   Diagnosis Date Noted   UTI (urinary tract infection) 02/23/2021   Osteomyelitis of vertebra, sacral and sacrococcygeal region 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) 06/01/2020   Osteomyelitis of sacrum (HCC) 04/13/2020   Urinary retention 03/19/2020   History of DVT (deep vein thrombosis) 03/19/2020   Pressure injury of sacral region, stage 4 (HCC) 03/19/2020   Severe sepsis with septic shock (HCC) 03/18/2020   Obesity, Class III, BMI 40-49.9 (morbid obesity) (HCC) 03/18/2020   Cellulitis 03/18/2020   Acute cystitis without hematuria    Chronic kidney disease (CKD), stage III (moderate) (HCC) 01/20/2020   Pressure injury of skin 01/17/2020   DVT (deep venous thrombosis) (HCC) 01/16/2020   Cellulitis of sacral region 01/16/2020   HLD (hyperlipidemia) 01/16/2020   Bipolar disorder, in full remission, most recent episode mixed (HCC) 12/29/2019   High risk medication use 10/25/2019   Noncompliance with treatment plan 10/25/2019   Bipolar I disorder, most recent episode  mixed (HCC) 01/07/2019   GAD (generalized anxiety disorder) 01/07/2019   Insomnia due to medical condition 01/07/2019   DVT, recurrent, lower extremity, acute (HCC) 03/06/2016   Bipolar disorder (HCC) 01/03/2016   Lithium toxicity 01/02/2016   Chronic anticoagulation 07/06/2014   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/06/2014   Hyperlipidemia 07/06/2014   Hypertension 07/06/2014   Leg swelling 07/06/2014   Obesity, Class II, BMI 35-39.9, with comorbidity 07/06/2014   On medication for venous thromboembolism 07/06/2014   Sleep apnea 07/06/2014   Type II diabetes mellitus with renal manifestations (HCC) 07/06/2014   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   Venous thromboembolism (VTE) 07/06/2014    Orientation RESPIRATION BLADDER Height & Weight     Self, Time, Situation, Place  Normal Incontinent, Indwelling catheter Weight: 112.9 kg Height:  5\' 6"  (167.6 cm)  BEHAVIORAL SYMPTOMS/MOOD NEUROLOGICAL BOWEL NUTRITION STATUS      Incontinent, Colostomy Diet (Carb modified diet)  AMBULATORY STATUS COMMUNICATION OF NEEDS Skin   Extensive Assist Verbally PU Stage and Appropriate Care   PU Stage 2 Dressing: Daily   PU Stage 4 Dressing: Daily               Personal Care Assistance Level of Assistance              Functional Limitations Info             SPECIAL CARE FACTORS FREQUENCY  PT (By licensed PT), OT (By licensed OT)                    Contractures Contractures Info: Not present  Additional Factors Info  Code Status, Allergies Code Status Info: Full Allergies Info: NKDA           Current Medications (02/26/2021):  This is the current hospital active medication list Current Facility-Administered Medications  Medication Dose Route Frequency Provider Last Rate Last Admin   acetaminophen (TYLENOL) tablet 650 mg  650 mg Oral Q6H PRN Charise Killian, MD       Or   acetaminophen (TYLENOL) suppository 650 mg   650 mg Rectal Q6H PRN Charise Killian, MD       irbesartan (AVAPRO) tablet 150 mg  150 mg Oral Daily Charise Killian, MD       And   amLODipine (NORVASC) tablet 5 mg  5 mg Oral Daily Charise Killian, MD       And   hydrochlorothiazide (MICROZIDE) capsule 12.5 mg  12.5 mg Oral Daily Charise Killian, MD       Ampicillin-Sulbactam (UNASYN) 3 g in sodium chloride 0.9 % 100 mL IVPB  3 g Intravenous Q6H Charise Killian, MD 200 mL/hr at 02/26/21 0902 3 g at 02/26/21 0902   apixaban (ELIQUIS) tablet 5 mg  5 mg Oral BID Charise Killian, MD   5 mg at 02/26/21 0855   bisacodyl (DULCOLAX) EC tablet 5 mg  5 mg Oral Daily PRN Charise Killian, MD       Chlorhexidine Gluconate Cloth 2 % PADS 6 each  6 each Topical Daily Charise Killian, MD   6 each at 02/25/21 0955   HYDROcodone-acetaminophen (NORCO/VICODIN) 5-325 MG per tablet 1-2 tablet  1-2 tablet Oral Q6H PRN Charise Killian, MD       insulin aspart (novoLOG) injection 0-9 Units  0-9 Units Subcutaneous TID WC Charise Killian, MD   1 Units at 02/26/21 0854   memantine (NAMENDA) tablet 10 mg  10 mg Oral BID Charise Killian, MD       morphine 2 MG/ML injection 1 mg  1 mg Intravenous Q4H PRN Charise Killian, MD       ondansetron [facility]) tablet 4 mg  4 mg Oral Q6H PRN Charise Killian, MD       Or   ondansetron [facility]) injection 4 mg  4 mg Intravenous Q6H PRN Charise Killian, MD       Oxcarbazepine (TRILEPTAL) tablet 300 mg  300 mg Oral BID Charise Killian, MD       QUEtiapine (SEROQUEL) tablet 100 mg  100 mg Oral QHS Charise Killian, MD   100 mg at 02/25/21 2129   rosuvastatin (CRESTOR) tablet 20 mg  20 mg Oral Daily Charise Killian, MD   20 mg at 02/26/21 0855   sodium chloride flush (NS) 0.9 % injection 3 mL  3 mL Intravenous Q12H Charise Killian, MD   3 mL at 02/25/21 2129     Discharge Medications: Please see discharge summary for a list of discharge medications.  Relevant  Imaging Results:  Relevant Lab Results:   Additional Information SSN: [id number]  Chapman Fitch, RN

## 2021-02-26 NOTE — Care Management Important Message (Signed)
Important Message  Patient Details  Name: [name] MRN: [id number] Date of Birth: 05-12-1956   Medicare Important Message Given:  N/A - LOS <3 / Initial given by admissions  Initial Medicare IM reviewed with patient by Bascom Levels, Patient Access Associate on 02/25/2021 at 11:16am.   [name] 02/26/2021, 9:43 AM

## 2021-02-26 NOTE — TOC Initial Note (Signed)
Transition of Care [facility]) - Initial/Assessment Note    Patient Details  Name: [name] MRN: [id number] Date of Birth: August 17, 1955  Transition of Care [facility]) CM/SW Contact:    Chapman Fitch, RN Phone Number: 02/26/2021, 1:20 PM  Clinical Narrative:                 Admitted for: UTI Admitted from: home with wife PCP: [facility].  Wife transports   Pharmacy: [facility].  Denies issues obtaining Medications Current home health/prior home health/DME: RW, shower seat, [facility]  PT has recommended SNF Patient agreeable to bed search   Existing PASRR Fl2 sent for signature Bed Search initiated  Patient states that he has had 2 covid vaccines, no boosters  Expected Discharge Plan: Skilled Nursing Facility Barriers to Discharge: Continued Medical Work up   Patient Goals and CMS Choice        Expected Discharge Plan and Services Expected Discharge Plan: Skilled Nursing Facility                                              Prior Living Arrangements/Services   Lives with:: Spouse Patient language and need for interpreter reviewed:: Yes Do you feel safe going back to the place where you live?: Yes      Need for Family Participation in Patient Care: Yes (Comment) Care giver support system in place?: Yes (comment) Current home services: DME Criminal Activity/Legal Involvement Pertinent to Current Situation/Hospitalization: No - Comment as needed  Activities of Daily Living Home Assistive Devices/Equipment: Walker (specify type) ADL Screening (condition at time of admission) Patient's cognitive ability adequate to safely complete daily activities?: Yes Is the patient deaf or have difficulty hearing?: No Does the patient have difficulty seeing, even when wearing glasses/contacts?: No Does the patient have difficulty concentrating, remembering, or making decisions?: No Patient able to express need for assistance with ADLs?: Yes Does the patient have  difficulty dressing or bathing?: Yes Independently performs ADLs?: No Communication: Independent Dressing (OT): Needs assistance Is this a change from baseline?: Pre-admission baseline Grooming: Needs assistance Is this a change from baseline?: Pre-admission baseline Feeding: Independent Bathing: Needs assistance Is this a change from baseline?: Pre-admission baseline Toileting: Needs assistance Is this a change from baseline?: Pre-admission baseline In/Out Bed: Needs assistance Is this a change from baseline?: Pre-admission baseline Does the patient have difficulty walking or climbing stairs?: No Weakness of Legs: Both Weakness of Arms/Hands: Both  Permission Sought/Granted                  Emotional Assessment       Orientation: : Oriented to Self, Oriented to Place, Oriented to  Time, Oriented to Situation Alcohol / Substance Use: Not Applicable Psych Involvement: No (comment)  Admission diagnosis:  UTI (urinary tract infection) [N39.0] Sepsis, due to unspecified organism, unspecified whether acute organ dysfunction present 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) 06/01/2020   Osteomyelitis of sacrum (HCC) 04/13/2020   Urinary retention  03/19/2020   History of DVT (deep vein thrombosis) 03/19/2020   Pressure injury of sacral region, stage 4 (HCC) 03/19/2020   Severe sepsis with septic shock (HCC) 03/18/2020   Obesity, Class III, BMI 40-49.9 (morbid obesity) (HCC) 03/18/2020   Cellulitis 03/18/2020   Acute cystitis  without hematuria    Chronic kidney disease (CKD), stage III (moderate) (HCC) 01/20/2020   Pressure injury of skin 01/17/2020   DVT (deep venous thrombosis) (HCC) 01/16/2020   Cellulitis of sacral region 01/16/2020   HLD (hyperlipidemia) 01/16/2020   Bipolar disorder, in full remission, most recent episode mixed (HCC) 12/29/2019   High risk medication use 10/25/2019   Noncompliance with treatment plan 10/25/2019   Bipolar I disorder, most recent episode mixed (HCC) 01/07/2019   GAD (generalized anxiety disorder) 01/07/2019   Insomnia due to medical condition 01/07/2019   DVT, recurrent, lower extremity, acute (HCC) 03/06/2016   Bipolar disorder (HCC) 01/03/2016   Lithium toxicity 01/02/2016   Chronic anticoagulation 07/06/2014   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 [place] [address] Phone: [phone] Fax: [phone]     Social Determinants of Health (SDOH) Interventions    Readmission Risk Interventions No flowsheet data found.

## 2021-02-26 NOTE — Progress Notes (Addendum)
PROGRESS NOTE    [name]  WER:[id number] DOB: 12-Apr-1956 DOA: 02/23/2021 PCP: Dione Booze, MD   Assessment & Plan:   Active Problems:   UTI (urinary tract infection)  Sepsis: meets criteria w/ tachycardia, leukocytosis and likely UTI and possible sacral infection. Given IV cefepime, flagyl & vanco x 1 in ER. Changed abxs to po cipro. Blood cxs NGTD. Urine cx growing pseudomonas. D/c IVFs. Pro-cal 5.70. Resolved   Possible UTI:  urine cx is growing is pseudomonas, will change abxs to po cipro.  Neurogenic bladder: chronic indwelling foley placed this past year. Changed monthly by [facility] RN & likely last changed at the beginning of August 2022 and will need to be changed prior to d/c    Sacral ulcer: present on admission, stage 4. Continue w/ wound care    Right heel ulcer: present on admission. Continue w/ wound care    Hyponatremia: stable from day prior   Likely AKI on CKD: baseline Cr/GFR is unknown, currently stage IIIa. Cr is labile  HLD: continue on statin    HTN: will restart home dose of HCTZ-olmesartan or hospital substitute    Hx of DVTs: continue on home dose of eliquis    Hx of TBI: continue on home dose of namenda, seroquel. Poor historian    Ostomy bag: present on admission. Unknown reason why it was initially placed. Continue w/ supportive care    DM2: likely poorly controlled. 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 H&H are stable    Morbidly obese: BMI 40.1. Complicates overall care & prognosis    DVT prophylaxis: eliquis  Code Status: full  Family Communication:  Disposition Plan:  PT/OT recs SNF and pt is agreeable   Status is: Inpatient  Remains inpatient appropriate because:Unsafe d/c plan, IV treatments appropriate due to intensity of illness or inability to take PO, and Inpatient level of care appropriate due to severity of illness  Dispo: The patient is from: Home               Anticipated d/c is to: SNF              Patient currently is not medically stable to d/c.   Difficult to place patient : unclear    Level of care: Med-Surg  Consultants:    Procedures:   Antimicrobials: cipri    Subjective: Pt c/o fatigue   Objective: Vitals:   02/25/21 0809 02/25/21 1548 02/25/21 1802 02/26/21 0428  BP: (!) 147/91 105/63 (!) 154/87 (!) 152/80  Pulse: 75 72 66 87  Resp: 16 18 17 18   Temp: 98.4 F (36.9 C) 97.6 F (36.4 C) 98.4 F (36.9 C) 97.9 F (36.6 C)  TempSrc: Oral     SpO2: 100% 100% 100% 100%  Weight:      Height:        Intake/Output Summary (Last 24 hours) at 02/26/2021 0756 Last data filed at 02/26/2021 0000 Gross per 24 hour  Intake 5 ml  Output 1475 ml  Net -1470 ml   Filed Weights   02/23/21 1236  Weight: 112.9 kg    Examination:  General exam: Appears calm & comfortable  Respiratory system: Clear breath sounds b/l. No rales  Cardiovascular system: S1 & S2+. No rubs or clicks  Gastrointestinal system: Abd is soft, NT, ostomy bag present, & normal bowel sounds  Central nervous system: Alert and oriented. Moves all extremities  Psychiatry: Judgement and insight  appear normal. Flat mood and affect     Data Reviewed: I have personally reviewed following labs and imaging studies  CBC: Recent Labs  Lab 02/23/21 1255 02/24/21 0723 02/25/21 0440 02/26/21 0409  WBC 15.0* 11.3* 8.0 6.7  NEUTROABS 13.0*  --   --   --   HGB 9.8* 9.0* 9.1* 8.8*  HCT 30.3* 27.7* 28.2* 27.2*  MCV 85.6 84.7 83.9 85.3  PLT 220 215 207 228   Basic Metabolic Panel: Recent Labs  Lab 02/23/21 1255 02/24/21 0723 02/25/21 0440 02/26/21 0409  NA 126* 131* 133* 133*  K 4.0 3.8 4.1 4.1  CL 98 103 107 109  CO2 19* 20* 21* 21*  GLUCOSE 79 49* 187* 140*  BUN 26* 23 17 13   CREATININE 2.01* 1.82* 1.43* 1.44*  CALCIUM 8.1* 8.0* 8.1* 8.1*   GFR: Estimated Creatinine Clearance: 60.3 mL/min (A) (by C-G formula based on SCr of 1.44 mg/dL  (H)). Liver Function Tests: Recent Labs  Lab 02/23/21 1255  AST 16  ALT 10  ALKPHOS 64  BILITOT 0.7  PROT 7.9  ALBUMIN 2.8*   No results for input(s): LIPASE, AMYLASE in the last 168 hours. No results for input(s): AMMONIA in the last 168 hours. Coagulation Profile: No results for input(s): INR, PROTIME in the last 168 hours. Cardiac Enzymes: No results for input(s): CKTOTAL, CKMB, CKMBINDEX, TROPONINI in the last 168 hours. BNP (last 3 results) No results for input(s): PROBNP in the last 8760 hours. HbA1C: Recent Labs    02/24/21 0723  HGBA1C 6.9*   CBG: Recent Labs  Lab 02/24/21 2300 02/25/21 0804 02/25/21 1209 02/25/21 1645 02/25/21 2235  GLUCAP 159* 164* 177* 138* 122*   Lipid Profile: No results for input(s): CHOL, HDL, LDLCALC, TRIG, CHOLHDL, LDLDIRECT in the last 72 hours. Thyroid Function Tests: No results for input(s): TSH, T4TOTAL, FREET4, T3FREE, THYROIDAB in the last 72 hours. Anemia Panel: No results for input(s): VITAMINB12, FOLATE, FERRITIN, TIBC, IRON, RETICCTPCT in the last 72 hours. Sepsis Labs: Recent Labs  Lab 02/23/21 0723 02/23/21 1255  PROCALCITON 5.70  --   LATICACIDVEN  --  1.2    Recent Results (from the past 240 hour(s))  Blood culture (routine single)     Status: None (Preliminary result)   Collection Time: 02/23/21 12:55 PM   Specimen: BLOOD  Result Value Ref Range Status   Specimen Description BLOOD LEFT ANTECUBITAL  Final   Special Requests   Final    BOTTLES DRAWN AEROBIC AND ANAEROBIC Blood Culture adequate volume   Culture   Final    NO GROWTH 3 DAYS Performed at [facility], [address] [place], [place]    Report Status PENDING  Incomplete  Urine Culture     Status: Abnormal   Collection Time: 02/23/21  1:21 PM   Specimen: Urine, Random  Result Value Ref Range Status   Specimen Description   Final    URINE, RANDOM Performed at [facility], [address] [place], [place]  [place]    Special Requests   Final    NONE Performed at [facility], [address] [place], [place]    Culture >=100,000 COLONIES/mL PSEUDOMONAS AERUGINOSA (A)  Final   Report Status 02/26/2021 FINAL  Final   Organism ID, Bacteria PSEUDOMONAS AERUGINOSA (A)  Final      Susceptibility   Pseudomonas aeruginosa - MIC*    CEFTAZIDIME 4 SENSITIVE Sensitive     CIPROFLOXACIN <=0.25 SENSITIVE Sensitive  GENTAMICIN 4 SENSITIVE Sensitive     IMIPENEM 2 SENSITIVE Sensitive     PIP/TAZO 8 SENSITIVE Sensitive     * >=100,000 COLONIES/mL PSEUDOMONAS AERUGINOSA  Resp Panel by RT-PCR (Flu A&B, Covid) Nasopharyngeal Swab     Status: None   Collection Time: 02/23/21  2:34 PM   Specimen: Nasopharyngeal Swab; Nasopharyngeal(NP) swabs in vial transport medium  Result Value Ref Range Status   SARS Coronavirus 2 by RT PCR NEGATIVE NEGATIVE Final    Comment: (NOTE) SARS-CoV-2 target nucleic acids are NOT DETECTED.  The SARS-CoV-2 RNA is generally detectable in upper respiratory specimens during the acute phase of infection. The lowest concentration of SARS-CoV-2 viral copies this assay can detect is 138 copies/mL. A negative result does not preclude SARS-Cov-2 infection and should not be used as the sole basis for treatment or other patient management decisions. A negative result may occur with  improper specimen collection/handling, submission of specimen other than nasopharyngeal swab, presence of viral mutation(s) within the areas targeted by this assay, and inadequate number of viral copies(<138 copies/mL). A negative result must be combined with clinical observations, patient history, and epidemiological information. The expected result is Negative.  Fact Sheet for Patients:  BloggerCourse.comhttps://www.fda.gov/media/152166/download  Fact Sheet for Healthcare Providers:  SeriousBroker.ithttps://www.fda.gov/media/152162/download  This test is no t yet approved or cleared by the [place] FDA  and  has been authorized for detection and/or diagnosis of SARS-CoV-2 by FDA under an Emergency Use Authorization (EUA). This EUA will remain  in effect (meaning this test can be used) for the duration of the COVID-19 declaration under Section 564(b)(1) of the Act, 21 U.S.C.section 360bbb-3(b)(1), unless the authorization is terminated  or revoked sooner.       Influenza A by PCR NEGATIVE NEGATIVE Final   Influenza B by PCR NEGATIVE NEGATIVE Final    Comment: (NOTE) The Xpert Xpress SARS-CoV-2/FLU/RSV plus assay is intended as an aid in the diagnosis of influenza from Nasopharyngeal swab specimens and should not be used as a sole basis for treatment. Nasal washings and aspirates are unacceptable for Xpert Xpress SARS-CoV-2/FLU/RSV testing.  Fact Sheet for Patients: BloggerCourse.comhttps://www.fda.gov/media/152166/download  Fact Sheet for Healthcare Providers: SeriousBroker.ithttps://www.fda.gov/media/152162/download  This test is not yet approved or cleared by the [place] FDA and has been authorized for detection and/or diagnosis of SARS-CoV-2 by FDA under an Emergency Use Authorization (EUA). This EUA will remain in effect (meaning this test can be used) for the duration of the COVID-19 declaration under Section 564(b)(1) of the Act, 21 U.S.C. section 360bbb-3(b)(1), unless the authorization is terminated or revoked.  Performed at [facility], [address] [place], [place] [address]   Culture, blood (single)     Status: None (Preliminary result)   Collection Time: 02/23/21  3:25 PM   Specimen: BLOOD  Result Value Ref Range Status   Specimen Description BLOOD BLOOD RIGHT HAND  Final   Special Requests   Final    BOTTLES DRAWN AEROBIC AND ANAEROBIC Blood Culture results may not be optimal due to an inadequate volume of blood received in culture bottles   Culture   Final    NO GROWTH 3 DAYS Performed at [facility], [address] [place], [place] [address]    Report  Status PENDING  Incomplete         Radiology Studies: No results found.      Scheduled Meds:  apixaban  5 mg Oral BID   Chlorhexidine Gluconate Cloth  6 each Topical Daily  insulin aspart  0-9 Units Subcutaneous TID WC   QUEtiapine  100 mg Oral QHS   rosuvastatin  20 mg Oral Daily   sodium chloride flush  3 mL Intravenous Q12H   Continuous Infusions:  ampicillin-sulbactam (UNASYN) IV 3 g (02/26/21 0425)     LOS: 3 days    Time spent: 25 mins     Charise Killian, MD [facility] Pager [phone]  If 7PM-7AM, please contact night-coverage 02/26/2021, 7:56 AM

## 2021-02-27 DIAGNOSIS — I10 Essential (primary) hypertension: Secondary | ICD-10-CM

## 2021-02-27 DIAGNOSIS — N390 Urinary tract infection, site not specified: Secondary | ICD-10-CM | POA: Diagnosis not present

## 2021-02-27 DIAGNOSIS — N179 Acute kidney failure, unspecified: Secondary | ICD-10-CM | POA: Diagnosis not present

## 2021-02-27 LAB — BASIC METABOLIC PANEL
Anion gap: 7 (ref 5–15)
BUN: 12 mg/dL (ref 8–23)
CO2: 21 mmol/L — ABNORMAL LOW (ref 22–32)
Calcium: 8.6 mg/dL — ABNORMAL LOW (ref 8.9–10.3)
Chloride: 106 mmol/L (ref 98–111)
Creatinine, Ser: 1.31 mg/dL — ABNORMAL HIGH (ref 0.61–1.24)
GFR, Estimated: >60 mL/min (ref >60)
Glucose, Bld: 146 mg/dL — ABNORMAL HIGH (ref 70–99)
Potassium: 3.9 mmol/L (ref 3.5–5.1)
Sodium: 134 mmol/L — ABNORMAL LOW (ref 135–145)

## 2021-02-27 LAB — GLUCOSE, CAPILLARY
Glucose-Capillary: 154 mg/dL — ABNORMAL HIGH (ref 70–99)
Glucose-Capillary: 190 mg/dL — ABNORMAL HIGH (ref 70–99)
Glucose-Capillary: 131 mg/dL — ABNORMAL HIGH (ref 70–99)
Glucose-Capillary: 131 mg/dL — ABNORMAL HIGH (ref 70–99)

## 2021-02-27 LAB — CBC
HCT: 30.3 % — ABNORMAL LOW (ref 39.0–52.0)
Hemoglobin: 9.6 g/dL — ABNORMAL LOW (ref 13.0–17.0)
MCH: 27.2 pg (ref 26.0–34.0)
MCHC: 31.7 g/dL (ref 30.0–36.0)
MCV: 85.8 fL (ref 80.0–100.0)
Platelets: 247 10*3/uL (ref 150–400)
RBC: 3.53 MIL/uL — ABNORMAL LOW (ref 4.22–5.81)
RDW: 17.2 % — ABNORMAL HIGH (ref 11.5–15.5)
WBC: 6.5 10*3/uL (ref 4.0–10.5)
nRBC: 0 % (ref 0.0–0.2)

## 2021-02-27 NOTE — TOC Progression Note (Signed)
Transition of Care [facility]) - Progression Note    Patient Details  Name: [name] MRN: [id number] Date of Birth: October 06, 1955  Transition of Care [facility]) CM/SW Contact  Margarito Liner, LCSW Phone Number: 02/27/2021, 3:19 PM  Clinical Narrative:   Only bed offer so far is [facility]. [facility], [facility], and [facility] are still pending. Patient's preference is [facility]. Admissions coordinator is aware and will review.  Expected Discharge Plan: Skilled Nursing Facility Barriers to Discharge: Continued Medical Work up  Expected Discharge Plan and Services Expected Discharge Plan: Skilled Nursing Facility                                               Social Determinants of Health (SDOH) Interventions    Readmission Risk Interventions No flowsheet data found.

## 2021-02-27 NOTE — Progress Notes (Signed)
Physical Therapy Treatment Patient Details Name: [name] MRN: [id number] DOB: [birth year] Today's Date: 02/27/2021    History of Present Illness Pt is a 65 y/o M admitted on 02/23/21 for tx of UTI after presenting with c/o weakness x 1 week. PMH: TBI, neurogenic bladder with chronic indwelling foley, ostomy bag, morbidly obese, DM2, HTN, HLD, DVT on eliquis, bipolar disorder    PT Comments    Pt was eager to work with PT and showed good effort t/o the session.  He had significant baseline weakness and ataxia and though functionally limited at baseline is considerably weaker than even this.  He was able to attain EOB and standing but needed +2 assist and struggled with any dynamic WBing/attempt at side stepping with poor tolerance and need to sit after laborious but functionally limited effort.  Pt is not safe to return home with current functional status, continues to need STR when medically ready for d/c.   Follow Up Recommendations  SNF     Equipment Recommendations       Recommendations for Other Services       Precautions / Restrictions Precautions Precautions: Fall Restrictions Weight Bearing Restrictions: No    Mobility  Bed Mobility Overal bed mobility: Needs Assistance Bed Mobility: Sit to Supine;Supine to Sit     Supine to sit: Max assist;+2 for physical assistance Sit to supine: Max assist;+2 for physical assistance   General bed mobility comments: Pt was able to use HHA to pull himself up some, but overall needed considerable assist to get up from and back to supine.  Bed deflated during transition but inherently a factor in limting ability to mobilize    Transfers Overall transfer level: Needs assistance Equipment used: Rolling walker (2 wheeled) Transfers: Sit to/from Stand Sit to Stand: Mod assist;+2 physical assistance         General transfer comment: Pt needed extra cuing to insure safe and appropriate set up to get to sitting.  Height of  bed made transition to standing relatively easy, extra assist to insure he was fully back before sitting back down on EOB  Ambulation/Gait             General Gait Details: Deferred ambulation 2/2 safety.  Pt was able to very laboriously manage a few very small side suffle steps.  considerable hip ABd and ankle DF weakness made this difficult   Stairs             Wheelchair Mobility    Modified Rankin (Stroke Patients Only)       Balance Overall balance assessment: Needs assistance Sitting-balance support: Feet unsupported;Bilateral upper extremity supported Sitting balance-Leahy Scale: Fair Sitting balance - Comments: Initially struggled to maintain sitting but once assisted to more appropriate position he did manage to maintain balance with only CGA   Standing balance support: Bilateral upper extremity supported Standing balance-Leahy Scale: Poor Standing balance comment: highly reliant on the walker, statically he maintained reasonably well but with any dynamic effort his decreased strength and coordination were big limiters                            Cognition Arousal/Alertness: Awake/alert Behavior During Therapy: [facility] for tasks assessed/performed                                          Exercises General  Exercises - Lower Extremity Ankle Circles/Pumps: AAROM;10 reps (PROM only for DF, some AROM with PF) Quad Sets: AROM;10 reps (poor coordination, inconsistent ability to engage apart from other movements) Heel Slides: AAROM;10 reps (with resisted leg ext, again inconsistent quality of motion) Hip ABduction/ADduction: AAROM;Strengthening;10 reps (AAROM b/l ABd, strengthening on ADd) Straight Leg Raises: AROM;10 reps (unable to control/maintain at apex >1 sec)    General Comments General comments (skin integrity, edema, etc.): Pt with inherent baseline weakness and decreased motor control, now with decline from that baseline.       Pertinent Vitals/Pain Pain Assessment: No/denies pain    Home Living                      Prior Function            PT Goals (current goals can now be found in the care plan section) Progress towards PT goals: Progressing toward goals    Frequency    Min 2X/week      PT Plan Current plan remains appropriate    Co-evaluation              AM-PAC PT "6 Clicks" Mobility   Outcome Measure  Help needed turning from your back to your side while in a flat bed without using bedrails?: A Lot Help needed moving from lying on your back to sitting on the side of a flat bed without using bedrails?: Total Help needed moving to and from a bed to a chair (including a wheelchair)?: Total Help needed standing up from a chair using your arms (e.g., wheelchair or bedside chair)?: A Lot Help needed to walk in hospital room?: Total Help needed climbing 3-5 steps with a railing? : Total 6 Click Score: 8    End of Session Equipment Utilized During Treatment: Gait belt Activity Tolerance: Patient tolerated treatment well Patient left: in bed;with call bell/phone within reach   PT Visit Diagnosis: Muscle weakness (generalized) (M62.81);Difficulty in walking, not elsewhere classified (R26.2)     Time: 8657-8469 PT Time Calculation (min) (ACUTE ONLY): 31 min  Charges:  $Therapeutic Exercise: 8-22 mins $Therapeutic Activity: 8-22 mins                     Malachi Pro, DPT 02/27/2021, 5:28 PM

## 2021-02-27 NOTE — Progress Notes (Signed)
Foley exchange complete. Order verified by provider note 8/30 at 0826 and per day shift RN report.

## 2021-02-27 NOTE — Progress Notes (Signed)
   02/27/21 1415  Clinical Encounter Type  Visited With Patient  Visit Type Initial;Spiritual support;Social support  Referral From Chaplain  Consult/Referral To Chaplain   Chaplain visited PT who was watching tv. Chaplain made space for PT to express his emotions. The PT spoke of his upbring in the area and hoped that he would get better physically. Chaplain told PT he would check back in again tomorrow  Posey Boyer, M.Div.

## 2021-02-27 NOTE — Progress Notes (Signed)
PROGRESS NOTE   HPI: 65 y/o M w/ PMH of TBI, neurogenic bladder w/ chronic indwelling foley placed over this last year, ostomy bag, morbidly obese, DM2, HTN, HLD, DVT on eliquis who presents w/ generalized weakness x 1 week. Pt is poor historian and hx was obtained from pt and previous progress notes. Pt denies pain, fever, chills, sweating, cough, chest pain, shortness of breath, nausea, vomiting, abd pain, dysuria, urinary frequency, urinary urgency, diarrhea, or constipation.    In the ED, pt was found to have UA was abnormal but pt denies any GU symptoms as pt a chronic indwelling foley. The foley is evidently has the foley changed monthly by [facility] RN. Pt lives with his wife and goddaughter.   Pt presented w/ sepsis likely secondary to UTI. Pt was initially treated w/ IV rocephin and switched to po cipro as per urine cx results. Sepsis has since resolved. Pt's foley should be changed today, messaged the pt's nurse. PT/OT evaluated the pt and recs SNF. Waiting SNF placement     Amedio Bowlby  EQA:[id number] DOB: 06-20-1956 DOA: 02/23/2021 PCP: Dione Booze, MD   Assessment & Plan:   Active Problems:   UTI (urinary tract infection)  Sepsis: meets criteria w/ tachycardia, leukocytosis and likely UTI and possible sacral infection. Given IV cefepime, flagyl & vanco x 1 in ER. Continue on po cipro. Blood cxs NGTD. Urine cx growing pseudomonas. D/c IVFs. Pro-cal 5.70. Resolved   Possible UTI:  urine cx is growing is pseudomonas. Continue on po cipro   Neurogenic bladder: chronic indwelling foley placed this past year. Changed monthly by [facility] RN & likely last changed at the beginning of August 2022. 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 Continue w/ wound care    Right heel ulcer: present on admission. Continue w/ wound care    Hyponatremia: trending up, almost WNL    Likely AKI on CKD: baseline Cr/GFR is unknown, currently stage IIIa.  Cr is labile  HLD: continue on statin    HTN: continue on home dose of amlodipine, HCTZ-olmesartan or hospital substitute    Hx of DVTs: continue on eliquis    Hx of TBI: continue on home dose of seroquel, namenda. Poor historian    Ostomy bag: present on admission. Unknown reason why it was initially placed. Continue w/ supportive care    DM2: likely poorly controlled. 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 H&& are labile    Morbidly obese: BMI 40.1. Complicates overall care & prognosis    DVT prophylaxis: eliquis  Code Status: full  Family Communication:  Disposition Plan:  PT/OT recs SNF and pt is agreeable   Status is: Inpatient  Remains inpatient appropriate because:Unsafe d/c plan, IV treatments appropriate due to intensity of illness or inability to take PO, and Inpatient level of care appropriate due to severity of illness  Dispo: The patient is from: Home              Anticipated d/c is to: SNF              Patient currently is not medically stable to d/c.   Difficult to place patient : unclear    Level of care: Med-Surg  Consultants:    Procedures:   Antimicrobials: cipr    Subjective: Pt c/o malaise  Objective: Vitals:   02/26/21 1631 02/26/21 2017 02/27/21 0515 02/27/21 9798  BP: (!) 184/96 (!) 141/83 138/75 (!) 148/86  Pulse: 68 66 74 70  Resp: 20 18 18    Temp: 97.7 F (36.5 C) 98.3 F (36.8 C) (!) 97.4 F (36.3 C) 97.6 F (36.4 C)  TempSrc: Oral Oral Oral Oral  SpO2: 99% 100% 100% 100%  Weight:      Height:        Intake/Output Summary (Last 24 hours) at 02/27/2021 0828 Last data filed at 02/27/2021 [id number] Gross per 24 hour  Intake 240 ml  Output 500 ml  Net -260 ml   Filed Weights   02/23/21 1236  Weight: 112.9 kg    Examination:  General exam: Appears comfortable  Respiratory system: Clear breath sounds b/l  Cardiovascular system: S1/S2+. No rubs or  clicks Gastrointestinal system: Abd is soft, NT, ostomy bag present w/ brown stool & normal bowel sounds  Central nervous system: Alert and oriented. Moves all extremities  Psychiatry: Judgement and insight appear normal. Flat mood and affect     Data Reviewed: I have personally reviewed following labs and imaging studies  CBC: Recent Labs  Lab 02/23/21 1255 02/24/21 0723 02/25/21 0440 02/26/21 0409 02/27/21 0428  WBC 15.0* 11.3* 8.0 6.7 6.5  NEUTROABS 13.0*  --   --   --   --   HGB 9.8* 9.0* 9.1* 8.8* 9.6*  HCT 30.3* 27.7* 28.2* 27.2* 30.3*  MCV 85.6 84.7 83.9 85.3 85.8  PLT 220 215 207 228 247   Basic Metabolic Panel: Recent Labs  Lab 02/23/21 1255 02/24/21 0723 02/25/21 0440 02/26/21 0409 02/27/21 0428  NA 126* 131* 133* 133* 134*  K 4.0 3.8 4.1 4.1 3.9  CL 98 103 107 109 106  CO2 19* 20* 21* 21* 21*  GLUCOSE 79 49* 187* 140* 146*  BUN 26* 23 17 13 12   CREATININE 2.01* 1.82* 1.43* 1.44* 1.31*  CALCIUM 8.1* 8.0* 8.1* 8.1* 8.6*   GFR: Estimated Creatinine Clearance: 66.3 mL/min (A) (by C-G formula based on SCr of 1.31 mg/dL (H)). Liver Function Tests: Recent Labs  Lab 02/23/21 1255  AST 16  ALT 10  ALKPHOS 64  BILITOT 0.7  PROT 7.9  ALBUMIN 2.8*   No results for input(s): LIPASE, AMYLASE in the last 168 hours. No results for input(s): AMMONIA in the last 168 hours. Coagulation Profile: No results for input(s): INR, PROTIME in the last 168 hours. Cardiac Enzymes: No results for input(s): CKTOTAL, CKMB, CKMBINDEX, TROPONINI in the last 168 hours. BNP (last 3 results) No results for input(s): PROBNP in the last 8760 hours. HbA1C: No results for input(s): HGBA1C in the last 72 hours.  CBG: Recent Labs  Lab 02/25/21 2235 02/26/21 0846 02/26/21 1207 02/26/21 1819 02/26/21 2054  GLUCAP 122* 137* 143* 166* 151*   Lipid Profile: No results for input(s): CHOL, HDL, LDLCALC, TRIG, CHOLHDL, LDLDIRECT in the last 72 hours. Thyroid Function Tests: No  results for input(s): TSH, T4TOTAL, FREET4, T3FREE, THYROIDAB in the last 72 hours. Anemia Panel: No results for input(s): VITAMINB12, FOLATE, FERRITIN, TIBC, IRON, RETICCTPCT in the last 72 hours. Sepsis Labs: Recent Labs  Lab 02/23/21 0723 02/23/21 1255  PROCALCITON 5.70  --   LATICACIDVEN  --  1.2    Recent Results (from the past 240 hour(s))  Blood culture (routine single)     Status: None (Preliminary result)   Collection Time: 02/23/21 12:55 PM   Specimen: BLOOD  Result Value Ref Range Status   Specimen Description BLOOD LEFT ANTECUBITAL  Final   Special Requests  Final    BOTTLES DRAWN AEROBIC AND ANAEROBIC Blood Culture adequate volume   Culture   Final    NO GROWTH 4 DAYS Performed at [facility], [address] [place], [place] [address]    Report Status PENDING  Incomplete  Urine Culture     Status: Abnormal   Collection Time: 02/23/21  1:21 PM   Specimen: Urine, Random  Result Value Ref Range Status   Specimen Description   Final    URINE, RANDOM Performed at [facility], [address], [place], [place] [address]    Special Requests   Final    NONE Performed at [facility], [address] [place], [place] [address]    Culture >=100,000 COLONIES/mL PSEUDOMONAS AERUGINOSA (A)  Final   Report Status 02/26/2021 FINAL  Final   Organism ID, Bacteria PSEUDOMONAS AERUGINOSA (A)  Final      Susceptibility   Pseudomonas aeruginosa - MIC*    CEFTAZIDIME 4 SENSITIVE Sensitive     CIPROFLOXACIN <=0.25 SENSITIVE Sensitive     GENTAMICIN 4 SENSITIVE Sensitive     IMIPENEM 2 SENSITIVE Sensitive     PIP/TAZO 8 SENSITIVE Sensitive     * >=100,000 COLONIES/mL PSEUDOMONAS AERUGINOSA  Resp Panel by RT-PCR (Flu A&B, Covid) Nasopharyngeal Swab     Status: None   Collection Time: 02/23/21  2:34 PM   Specimen: Nasopharyngeal Swab; Nasopharyngeal(NP) swabs in vial transport medium  Result Value Ref Range Status   SARS Coronavirus 2 by RT PCR  NEGATIVE NEGATIVE Final    Comment: (NOTE) SARS-CoV-2 target nucleic acids are NOT DETECTED.  The SARS-CoV-2 RNA is generally detectable in upper respiratory specimens during the acute phase of infection. The lowest concentration of SARS-CoV-2 viral copies this assay can detect is 138 copies/mL. A negative result does not preclude SARS-Cov-2 infection and should not be used as the sole basis for treatment or other patient management decisions. A negative result may occur with  improper specimen collection/handling, submission of specimen other than nasopharyngeal swab, presence of viral mutation(s) within the areas targeted by this assay, and inadequate number of viral copies(<138 copies/mL). A negative result must be combined with clinical observations, patient history, and epidemiological information. The expected result is Negative.  Fact Sheet for Patients:  BloggerCourse.com  Fact Sheet for Healthcare Providers:  SeriousBroker.it  This test is no t yet approved or cleared by the [place] FDA and  has been authorized for detection and/or diagnosis of SARS-CoV-2 by FDA under an Emergency Use Authorization (EUA). This EUA will remain  in effect (meaning this test can be used) for the duration of the COVID-19 declaration under Section 564(b)(1) of the Act, 21 U.S.C.section 360bbb-3(b)(1), unless the authorization is terminated  or revoked sooner.       Influenza A by PCR NEGATIVE NEGATIVE Final   Influenza B by PCR NEGATIVE NEGATIVE Final    Comment: (NOTE) The Xpert Xpress SARS-CoV-2/FLU/RSV plus assay is intended as an aid in the diagnosis of influenza from Nasopharyngeal swab specimens and should not be used as a sole basis for treatment. Nasal washings and aspirates are unacceptable for Xpert Xpress SARS-CoV-2/FLU/RSV testing.  Fact Sheet for Patients: BloggerCourse.com  Fact Sheet for  Healthcare Providers: SeriousBroker.it  This test is not yet approved or cleared by the [place] FDA and has been authorized for detection and/or diagnosis of SARS-CoV-2 by FDA under an Emergency Use Authorization (EUA). This EUA will remain in effect (meaning this test can be used) for  the duration of the COVID-19 declaration under Section 564(b)(1) of the Act, 21 U.S.C. section 360bbb-3(b)(1), unless the authorization is terminated or revoked.  Performed at [facility], [address] [place], [place] [address]   Culture, blood (single)     Status: None (Preliminary result)   Collection Time: 02/23/21  3:25 PM   Specimen: BLOOD  Result Value Ref Range Status   Specimen Description BLOOD BLOOD RIGHT HAND  Final   Special Requests   Final    BOTTLES DRAWN AEROBIC AND ANAEROBIC Blood Culture results may not be optimal due to an inadequate volume of blood received in culture bottles   Culture   Final    NO GROWTH 4 DAYS Performed at [facility], [address] [place], [place] [address]    Report Status PENDING  Incomplete         Radiology Studies: No results found.      Scheduled Meds:  irbesartan  150 mg Oral Daily   And   amLODipine  5 mg Oral Daily   And   hydrochlorothiazide  12.5 mg Oral Daily   apixaban  5 mg Oral BID   Chlorhexidine Gluconate Cloth  6 each Topical Daily   ciprofloxacin  500 mg Oral BID   insulin aspart  0-9 Units Subcutaneous TID WC   memantine  10 mg Oral BID   OXcarbazepine  300 mg Oral BID   QUEtiapine  100 mg Oral QHS   rosuvastatin  20 mg Oral Daily   sodium chloride flush  3 mL Intravenous Q12H   Continuous Infusions:     LOS: 4 days    Time spent: 20 mins     Charise Killian, MD [facility] Pager [phone]  If 7PM-7AM, please contact night-coverage 02/27/2021, 8:28 AM

## 2021-02-28 DIAGNOSIS — N3001 Acute cystitis with hematuria: Secondary | ICD-10-CM | POA: Diagnosis not present

## 2021-02-28 LAB — CULTURE, BLOOD (SINGLE)
Culture: NO GROWTH
Culture: NO GROWTH
Special Requests: ADEQUATE

## 2021-02-28 LAB — GLUCOSE, CAPILLARY
Glucose-Capillary: 105 mg/dL — ABNORMAL HIGH (ref 70–99)
Glucose-Capillary: 164 mg/dL — ABNORMAL HIGH (ref 70–99)
Glucose-Capillary: 222 mg/dL — ABNORMAL HIGH (ref 70–99)
Glucose-Capillary: 161 mg/dL — ABNORMAL HIGH (ref 70–99)

## 2021-02-28 LAB — BASIC METABOLIC PANEL
Anion gap: 7 (ref 5–15)
BUN: 12 mg/dL (ref 8–23)
CO2: 24 mmol/L (ref 22–32)
Calcium: 8.8 mg/dL — ABNORMAL LOW (ref 8.9–10.3)
Chloride: 103 mmol/L (ref 98–111)
Creatinine, Ser: 1.6 mg/dL — ABNORMAL HIGH (ref 0.61–1.24)
GFR, Estimated: 48 mL/min — ABNORMAL LOW (ref >60)
Glucose, Bld: 117 mg/dL — ABNORMAL HIGH (ref 70–99)
Potassium: 4.4 mmol/L (ref 3.5–5.1)
Sodium: 134 mmol/L — ABNORMAL LOW (ref 135–145)

## 2021-02-28 LAB — CBC
HCT: 30.2 % — ABNORMAL LOW (ref 39.0–52.0)
Hemoglobin: 9.5 g/dL — ABNORMAL LOW (ref 13.0–17.0)
MCH: 27 pg (ref 26.0–34.0)
MCHC: 31.5 g/dL (ref 30.0–36.0)
MCV: 85.8 fL (ref 80.0–100.0)
Platelets: 271 10*3/uL (ref 150–400)
RBC: 3.52 MIL/uL — ABNORMAL LOW (ref 4.22–5.81)
RDW: 17.2 % — ABNORMAL HIGH (ref 11.5–15.5)
WBC: 6.6 10*3/uL (ref 4.0–10.5)
nRBC: 0 % (ref 0.0–0.2)

## 2021-02-28 NOTE — Progress Notes (Signed)
PROGRESS NOTE   HPI: 65 y/o M w/ PMH of TBI, neurogenic bladder w/ chronic indwelling foley placed over this last year, ostomy bag, morbidly obese, DM2, HTN, HLD, DVT on eliquis who presents w/ generalized weakness x 1 week. Pt is poor historian and hx was obtained from pt and previous progress notes. Pt denies pain, fever, chills, sweating, cough, chest pain, shortness of breath, nausea, vomiting, abd pain, dysuria, urinary frequency, urinary urgency, diarrhea, or constipation.    In the ED, pt was found to have UA was abnormal but pt denies any GU symptoms as pt a chronic indwelling foley. The foley is evidently has the foley changed monthly by [facility] RN. Pt lives with his wife and goddaughter.   Pt presented w/ sepsis likely secondary to UTI. Pt was initially treated w/ IV rocephin and switched to po cipro as per urine cx results. Sepsis has since resolved. Pt's foley should be changed today, messaged the pt's nurse. PT/OT evaluated the pt and recs SNF. Waiting SNF placement     Amin Fornwalt  TGP:[id number] DOB: September 24, 1955 DOA: 02/23/2021 PCP: Dione Booze, MD   Assessment & Plan:   Active Problems:   UTI (urinary tract infection)  Sepsis: meets criteria w/ tachycardia, leukocytosis and likely UTI and possible sacral infection. Given IV cefepime, flagyl & vanco x 1 in ER. Continue on po cipro. Blood cxs NGTD. Urine cx growing pseudomonas. D/c IVFs. Pro-cal 5.70. Resolved   Possible UTI:  urine cx is growing is pseudomonas. Continue on po cipro   Neurogenic bladder: chronic indwelling foley placed this past year. Changed monthly by [facility] RN & likely last changed at the beginning of August 2022. 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 Continue w/ wound care    Right heel ulcer: present on admission. Continue w/ wound care    Hyponatremia: trending up, almost WNL     Likely AKI on CKD: baseline Cr/GFR is unknown, currently stage IIIa.  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 Poor historian    Ostomy bag: present on admission. Unknown reason why it was initially placed. Continue w/ supportive care    DM2: likely poorly controlled. 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 H&& are labile    Morbidly obese: BMI 40.1. Complicates overall care & prognosis    DVT prophylaxis: eliquis  Code Status: full  Family Communication:  Disposition Plan:  PT/OT recs SNF and pt is agreeable   Status is: Inpatient  Remains inpatient appropriate because:Unsafe d/c plan, IV treatments appropriate due to intensity of illness or inability to take PO, and Inpatient level of care appropriate due to severity of illness  Dispo: The patient is from: Home              Anticipated d/c is to: SNF              Patient currently is not medically stable to d/c.   Difficult to place patient : unclear    Level of care: Med-Surg  Consultants:    Procedures:   Antimicrobials: cipro    Subjective: No significant overnight events, patient denied any chest pain  or abdominal pain, no shortness of breath.  Denied any other active issues.  Objective: Vitals:   02/27/21 2001 02/28/21 0413 02/28/21 0736 02/28/21 1554  BP: (!) 145/89 119/63 (!) 143/87 135/73  Pulse: 80 66 75 73  Resp: 16 17 16 17   Temp: 97.6 F (36.4 C) 97.9 F (36.6 C) (!) 97.5 F (36.4 C) (!) 97.5 F (36.4 C)  TempSrc: Oral Oral Oral Oral  SpO2: 100% 100% 100% 100%  Weight:      Height:        Intake/Output Summary (Last 24 hours) at 02/28/2021 1629 Last data filed at 02/28/2021 1613 Gross per 24 hour  Intake 240 ml  Output 3725 ml  Net -3485 ml   Filed Weights   02/23/21 1236  Weight:  112.9 kg    Examination:  General exam: Appears comfortable  Respiratory system: Clear breath sounds b/l  Cardiovascular system: S1/S2+. No rubs or clicks Gastrointestinal system: Abd is soft, NT, ostomy bag present w/ brown stool & normal bowel sounds  Central nervous system: Alert and oriented. Moves all extremities  Psychiatry: Judgement and insight appear normal. Flat mood and affect     Data Reviewed: I have personally reviewed following labs and imaging studies  CBC: Recent Labs  Lab 02/23/21 1255 02/24/21 0723 02/25/21 0440 02/26/21 0409 02/27/21 0428 02/28/21 0702  WBC 15.0* 11.3* 8.0 6.7 6.5 6.6  NEUTROABS 13.0*  --   --   --   --   --   HGB 9.8* 9.0* 9.1* 8.8* 9.6* 9.5*  HCT 30.3* 27.7* 28.2* 27.2* 30.3* 30.2*  MCV 85.6 84.7 83.9 85.3 85.8 85.8  PLT 220 215 207 228 247 271   Basic Metabolic Panel: Recent Labs  Lab 02/24/21 0723 02/25/21 0440 02/26/21 0409 02/27/21 0428 02/28/21 0702  NA 131* 133* 133* 134* 134*  K 3.8 4.1 4.1 3.9 4.4  CL 103 107 109 106 103  CO2 20* 21* 21* 21* 24  GLUCOSE 49* 187* 140* 146* 117*  BUN 23 17 13 12 12   CREATININE 1.82* 1.43* 1.44* 1.31* 1.60*  CALCIUM 8.0* 8.1* 8.1* 8.6* 8.8*   GFR: Estimated Creatinine Clearance: 54.3 mL/min (A) (by C-G formula based on SCr of 1.6 mg/dL (H)). Liver Function Tests: Recent Labs  Lab 02/23/21 1255  AST 16  ALT 10  ALKPHOS 64  BILITOT 0.7  PROT 7.9  ALBUMIN 2.8*   No results for input(s): LIPASE, AMYLASE in the last 168 hours. No results for input(s): AMMONIA in the last 168 hours. Coagulation Profile: No results for input(s): INR, PROTIME in the last 168 hours. Cardiac Enzymes: No results for input(s): CKTOTAL, CKMB, CKMBINDEX, TROPONINI in the last 168 hours. BNP (last 3 results) No results for input(s): PROBNP in the last 8760 hours. HbA1C: No results for input(s): HGBA1C in the last 72 hours.  CBG: Recent Labs  Lab 02/27/21 1135 02/27/21 1636 02/27/21 2128  02/28/21 0736 02/28/21 1156  GLUCAP 190* 131* 131* 105* 164*   Lipid Profile: No results for input(s): CHOL, HDL, LDLCALC, TRIG, CHOLHDL, LDLDIRECT in the last 72 hours. Thyroid Function Tests: No results for input(s): TSH, T4TOTAL, FREET4, T3FREE, THYROIDAB in the last 72 hours. Anemia Panel: No results for input(s): VITAMINB12, FOLATE, FERRITIN, TIBC, IRON, RETICCTPCT in the last 72 hours. Sepsis Labs: Recent Labs  Lab 02/23/21 0723 02/23/21 1255  PROCALCITON 5.70  --   LATICACIDVEN  --  1.2    Recent Results (from the past 240 hour(s))  Blood culture (routine single)  Status: None   Collection Time: 02/23/21 12:55 PM   Specimen: BLOOD  Result Value Ref Range Status   Specimen Description BLOOD LEFT ANTECUBITAL  Final   Special Requests   Final    BOTTLES DRAWN AEROBIC AND ANAEROBIC Blood Culture adequate volume   Culture   Final    NO GROWTH 5 DAYS Performed at [facility], [address] [place], [place] [address]    Report Status 02/28/2021 FINAL  Final  Urine Culture     Status: Abnormal   Collection Time: 02/23/21  1:21 PM   Specimen: Urine, Random  Result Value Ref Range Status   Specimen Description   Final    URINE, RANDOM Performed at [facility], [address], [place], [place] [address]    Special Requests   Final    NONE Performed at [facility], [address] [place], [place] [address]    Culture >=100,000 COLONIES/mL PSEUDOMONAS AERUGINOSA (A)  Final   Report Status 02/26/2021 FINAL  Final   Organism ID, Bacteria PSEUDOMONAS AERUGINOSA (A)  Final      Susceptibility   Pseudomonas aeruginosa - MIC*    CEFTAZIDIME 4 SENSITIVE Sensitive     CIPROFLOXACIN <=0.25 SENSITIVE Sensitive     GENTAMICIN 4 SENSITIVE Sensitive     IMIPENEM 2 SENSITIVE Sensitive     PIP/TAZO 8 SENSITIVE Sensitive     * >=100,000 COLONIES/mL PSEUDOMONAS AERUGINOSA  Resp Panel by RT-PCR (Flu A&B, Covid) Nasopharyngeal Swab     Status:  None   Collection Time: 02/23/21  2:34 PM   Specimen: Nasopharyngeal Swab; Nasopharyngeal(NP) swabs in vial transport medium  Result Value Ref Range Status   SARS Coronavirus 2 by RT PCR NEGATIVE NEGATIVE Final    Comment: (NOTE) SARS-CoV-2 target nucleic acids are NOT DETECTED.  The SARS-CoV-2 RNA is generally detectable in upper respiratory specimens during the acute phase of infection. The lowest concentration of SARS-CoV-2 viral copies this assay can detect is 138 copies/mL. A negative result does not preclude SARS-Cov-2 infection and should not be used as the sole basis for treatment or other patient management decisions. A negative result may occur with  improper specimen collection/handling, submission of specimen other than nasopharyngeal swab, presence of viral mutation(s) within the areas targeted by this assay, and inadequate number of viral copies(<138 copies/mL). A negative result must be combined with clinical observations, patient history, and epidemiological information. The expected result is Negative.  Fact Sheet for Patients:  BloggerCourse.com  Fact Sheet for Healthcare Providers:  SeriousBroker.it  This test is no t yet approved or cleared by the [place] FDA and  has been authorized for detection and/or diagnosis of SARS-CoV-2 by FDA under an Emergency Use Authorization (EUA). This EUA will remain  in effect (meaning this test can be used) for the duration of the COVID-19 declaration under Section 564(b)(1) of the Act, 21 U.S.C.section 360bbb-3(b)(1), unless the authorization is terminated  or revoked sooner.       Influenza A by PCR NEGATIVE NEGATIVE Final   Influenza B by PCR NEGATIVE NEGATIVE Final    Comment: (NOTE) The Xpert Xpress SARS-CoV-2/FLU/RSV plus assay is intended as an aid in the diagnosis of influenza from Nasopharyngeal swab specimens and should not be used as a sole basis for  treatment. Nasal washings and aspirates are unacceptable for Xpert Xpress SARS-CoV-2/FLU/RSV testing.  Fact Sheet for Patients: BloggerCourse.com  Fact Sheet for Healthcare Providers: SeriousBroker.it  This test is not yet approved or cleared by  the [employer] and has been authorized for detection and/or diagnosis of SARS-CoV-2 by FDA under an Emergency Use Authorization (EUA). This EUA will remain in effect (meaning this test can be used) for the duration of the COVID-19 declaration under Section 564(b)(1) of the Act, 21 U.S.C. section 360bbb-3(b)(1), unless the authorization is terminated or revoked.  Performed at [facility], [address] [place], [place] [address]   Culture, blood (single)     Status: None   Collection Time: 02/23/21  3:25 PM   Specimen: BLOOD  Result Value Ref Range Status   Specimen Description BLOOD BLOOD RIGHT HAND  Final   Special Requests   Final    BOTTLES DRAWN AEROBIC AND ANAEROBIC Blood Culture results may not be optimal due to an inadequate volume of blood received in culture bottles   Culture   Final    NO GROWTH 5 DAYS Performed at [facility], [address], [place], [place] [address]    Report Status 02/28/2021 FINAL  Final         Radiology Studies: No results found.      Scheduled Meds:  irbesartan  150 mg Oral Daily   And   amLODipine  5 mg Oral Daily   apixaban  5 mg Oral BID   Chlorhexidine Gluconate Cloth  6 each Topical Daily   ciprofloxacin  500 mg Oral BID   insulin aspart  0-9 Units Subcutaneous TID WC   memantine  10 mg Oral BID   OXcarbazepine  300 mg Oral BID   QUEtiapine  100 mg Oral QHS   rosuvastatin  20 mg Oral Daily   sodium chloride flush  3 mL Intravenous Q12H   Continuous Infusions:     LOS: 5 days    Time spent: 20 mins     Gillis Santa, MD [facility] Pager [phone]  If 7PM-7AM, please contact  night-coverage 02/28/2021, 4:29 PM

## 2021-02-28 NOTE — TOC Progression Note (Addendum)
Transition of Care [facility]) - Progression Note    Patient Details  Name: [name] MRN: [id number] Date of Birth: 12/17/1955  [facility] CM/SW Contact  Margarito Liner, LCSW Phone Number: 02/28/2021, 2:00 PM  Clinical Narrative:  [facility] is unable to offer a bed. Patient has been to [facility] before so he asked that they review his referral. Left message for admissions coordinator with request.   4:13 pm: [facility] likely won't have a bed until early next week. Left message for [facility] admissions coordinator to confirm bed offer and make sure she knew patient has foley, colostomy, and air mattress.  Expected Discharge Plan: Skilled Nursing Facility Barriers to Discharge: Continued Medical Work up  Expected Discharge Plan and Services Expected Discharge Plan: Skilled Nursing Facility                                               Social Determinants of Health (SDOH) Interventions    Readmission Risk Interventions No flowsheet data found.

## 2021-02-28 NOTE — Progress Notes (Signed)
Occupational Therapy Treatment Patient Details Name: Rosalio Catterton MRN: [id number] DOB: Oct 13, 1955 Today's Date: 02/28/2021    History of present illness Pt is a 65 y/o M admitted on 02/23/21 for tx of UTI after presenting with c/o weakness x 1 week. PMH: TBI, neurogenic bladder with chronic indwelling foley, ostomy bag, morbidly obese, DM2, HTN, HLD, DVT on eliquis, bipolar disorder   OT comments  Pt seen for OT treatment on this date. Upon arrival to room, pt awake with RN finishing wound care/dressing. Pt agreeable to OT tx and stating preference to attempt OOB mobility this date. Pt continues to present with decreased strength, balance, and activity tolerance, however is making good progress towards goals. This date, pt required MOD A for sit<>supine transfer, MAX A for LB dressing, MOD A+2 for sit<>stand transfer with RW, and SUPERVISION for seated grooming tasks. At end of session, pt engaged in UB/LB seated therapy exercises. Pt continues to benefit from skilled OT services to maximize return to PLOF and minimize risk of future falls, injury, caregiver burden, and readmission. Will continue to follow POC. Discharge recommendation remains appropriate.     Follow Up Recommendations  SNF    Equipment Recommendations  Other (comment) (defer to next venue of care)       Precautions / Restrictions Precautions Precautions: Fall Precaution Comments: ostomy bag, chronic foley, sacral wound Restrictions Weight Bearing Restrictions: No       Mobility Bed Mobility Overal bed mobility: Needs Assistance Bed Mobility: Sit to Supine;Supine to Sit     Supine to sit: Mod assist Sit to supine: Mod assist   General bed mobility comments: With use of bedrails and vc for body positoning, pt required MOD A from trunk support during supine<>sit transfer. +2 person-assist to shift trunk towards HOB    Transfers Overall transfer level: Needs assistance Equipment used: Rolling walker (2  wheeled) Transfers: Sit to/from Stand Sit to Stand: Mod assist;+2 physical assistance         General transfer comment: Verbal cues for body positioning.    Balance Overall balance assessment: Needs assistance Sitting-balance support: Feet unsupported;Bilateral upper extremity supported Sitting balance-Leahy Scale: Fair Sitting balance - Comments: Fair sitting balance during UB grooming tasks, requiring supervision only   Standing balance support: Bilateral upper extremity supported;During functional activity Standing balance-Leahy Scale: Zero Standing balance comment: MOD A+2 to maintain static standing balance and march 6x. Some L knee and L elbow buckling                           ADL either performed or assessed with clinical judgement   ADL Overall ADL's : Needs assistance/impaired     Grooming: Wash/dry hands;Wash/dry face;Applying deodorant;Supervision/safety;Set up;Sitting       Lower Body Bathing: Maximal assistance;Sitting/lateral leans Lower Body Bathing Details (indicate cue type and reason): don socks                     Functional mobility during ADLs: Maximal assistance;+2 for physical assistance;Rolling walker        Cognition Arousal/Alertness: Awake/alert Behavior During Therapy: WFL for tasks assessed/performed Overall Cognitive Status: No family/caregiver present to determine baseline cognitive functioning                                 General Comments: Pt pleasant and motivated throughout session. Some decreased awareness of deficits  Exercises General Exercises - Upper Extremity Shoulder Horizontal ABduction: AROM;Both;10 reps;Seated Elbow Flexion: AROM;Both;10 reps;Seated Elbow Extension: AROM;Both;Seated General Exercises - Lower Extremity Ankle Circles/Pumps: AAROM;10 reps;Seated (PROM only for DF, some AROM with PF) Quad Sets: AROM;10 reps;Seated Hip Flexion/Marching: AROM;Both;5 reps;Standing            Pertinent Vitals/ Pain       Pain Assessment: No/denies pain         Frequency  Min 1X/week        Progress Toward Goals  OT Goals(current goals can now be found in the care plan section)  Progress towards OT goals: Progressing toward goals  Acute Rehab OT Goals Patient Stated Goal: to get stronger OT Goal Formulation: With patient Time For Goal Achievement: 03/11/21 Potential to Achieve Goals: Fair  Plan Discharge plan remains appropriate;Frequency remains appropriate       AM-PAC OT "6 Clicks" Daily Activity     Outcome Measure   Help from another person eating meals?: None Help from another person taking care of personal grooming?: A Little Help from another person toileting, which includes using toliet, bedpan, or urinal?: Total Help from another person bathing (including washing, rinsing, drying)?: A Lot Help from another person to put on and taking off regular upper body clothing?: A Little Help from another person to put on and taking off regular lower body clothing?: A Lot 6 Click Score: 15    End of Session Equipment Utilized During Treatment: Gait belt;Rolling walker  OT Visit Diagnosis: Muscle weakness (generalized) (M62.81)   Activity Tolerance Patient tolerated treatment well   Patient Left in bed;with call bell/phone within reach;with bed alarm set;with nursing/sitter in room   Nurse Communication Mobility status        Time: 9833-8250 OT Time Calculation (min): 31 min  Charges: OT General Charges $OT Visit: 1 Visit OT Treatments $Self Care/Home Management : 8-22 mins $Therapeutic Activity: 8-22 mins  Matthew Folks, OTR/L [facility] [phone]

## 2021-03-01 ENCOUNTER — Inpatient Hospital Stay: Payer: Medicare Other

## 2021-03-01 DIAGNOSIS — N3001 Acute cystitis with hematuria: Secondary | ICD-10-CM | POA: Diagnosis not present

## 2021-03-01 LAB — BASIC METABOLIC PANEL
Anion gap: 7 (ref 5–15)
BUN: 15 mg/dL (ref 8–23)
CO2: 24 mmol/L (ref 22–32)
Calcium: 8.4 mg/dL — ABNORMAL LOW (ref 8.9–10.3)
Chloride: 100 mmol/L (ref 98–111)
Creatinine, Ser: 1.82 mg/dL — ABNORMAL HIGH (ref 0.61–1.24)
GFR, Estimated: 41 mL/min — ABNORMAL LOW (ref >60)
Glucose, Bld: 153 mg/dL — ABNORMAL HIGH (ref 70–99)
Potassium: 4 mmol/L (ref 3.5–5.1)
Sodium: 131 mmol/L — ABNORMAL LOW (ref 135–145)

## 2021-03-01 LAB — MAGNESIUM: Magnesium: 2 mg/dL (ref 1.7–2.4)

## 2021-03-01 LAB — CBC
HCT: 30.1 % — ABNORMAL LOW (ref 39.0–52.0)
Hemoglobin: 9.6 g/dL — ABNORMAL LOW (ref 13.0–17.0)
MCH: 26.7 pg (ref 26.0–34.0)
MCHC: 31.9 g/dL (ref 30.0–36.0)
MCV: 83.8 fL (ref 80.0–100.0)
Platelets: 296 10*3/uL (ref 150–400)
RBC: 3.59 MIL/uL — ABNORMAL LOW (ref 4.22–5.81)
RDW: 17.2 % — ABNORMAL HIGH (ref 11.5–15.5)
WBC: 6 10*3/uL (ref 4.0–10.5)
nRBC: 0 % (ref 0.0–0.2)

## 2021-03-01 LAB — GLUCOSE, CAPILLARY
Glucose-Capillary: 148 mg/dL — ABNORMAL HIGH (ref 70–99)
Glucose-Capillary: 178 mg/dL — ABNORMAL HIGH (ref 70–99)
Glucose-Capillary: 191 mg/dL — ABNORMAL HIGH (ref 70–99)
Glucose-Capillary: 122 mg/dL — ABNORMAL HIGH (ref 70–99)

## 2021-03-01 LAB — PHOSPHORUS: Phosphorus: 3.4 mg/dL (ref 2.5–4.6)

## 2021-03-01 MED ORDER — SODIUM CHLORIDE 0.9 % IV SOLN: INTRAVENOUS | Status: AC

## 2021-03-01 NOTE — Progress Notes (Signed)
PROGRESS NOTE   HPI: 65 y/o M w/ PMH of TBI, neurogenic bladder w/ chronic indwelling foley placed over this last year, ostomy bag, morbidly obese, DM2, HTN, HLD, DVT on eliquis who presents w/ generalized weakness x 1 week. Pt is poor historian and hx was obtained from pt and previous progress notes. Pt denies pain, fever, chills, sweating, cough, chest pain, shortness of breath, nausea, vomiting, abd pain, dysuria, urinary frequency, urinary urgency, diarrhea, or constipation.    In the ED, pt was found to have UA was abnormal but pt denies any GU symptoms as pt a chronic indwelling foley. The foley is evidently has the [facility]. Pt lives with his wife and goddaughter.   Pt presented w/ sepsis likely secondary to UTI. Pt was initially treated w/ IV rocephin and switched to po cipro as per urine cx results. Sepsis has since resolved. Pt's foley should be changed today, messaged the pt's nurse. PT/OT evaluated the pt and recs SNF. Waiting SNF placement     [name] MaffucciJoseph Degraff  VHQ:[id number] DOB: [birth year] DOA: 02/23/2021 PCP: Dione BoozePartridge, James, MD   Assessment & Plan:   Active Problems:   UTI (urinary tract infection)  Sepsis: meets criteria w/ tachycardia, leukocytosis and likely UTI and possible sacral infection. Given IV cefepime, flagyl & vanco x 1 in ER. Continue on po cipro. Blood cxs NGTD. Urine cx growing pseudomonas. D/c IVFs. Pro-cal 5.70. Resolved   Possible UTI:  urine cx is growing is pseudomonas. Continue on po cipro   Neurogenic bladder: chronic indwelling foley placed this past year. [facility] & likely last changed at the beginning of August 2022. Messaged nurse 8/30 to change foley   AKI on CKD: baseline Cr/GFR is unknown, currently stage IIIa.  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 Continue w/ wound care    Right heel ulcer: present on admission. 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 Poor historian    Ostomy bag: present on admission. Unknown reason why it was initially placed. Continue w/ supportive care    DM2: likely poorly controlled. 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 H&& are labile    Morbidly obese: BMI 40.1. Complicates overall care & prognosis    DVT prophylaxis: eliquis  Code Status: full  Family Communication:  Disposition Plan:  PT/OT recs SNF and pt is agreeable   Status is: Inpatient  Remains inpatient appropriate because:Unsafe d/c plan, IV treatments appropriate due to intensity of illness or inability to take PO, and Inpatient level of care appropriate due to severity of illness  Dispo: The patient is from: Home              Anticipated d/c is to: SNF              Patient currently is not medically stable to d/c.  Developed AKI, elevated creatinine, started IV fluid, most likely disposition on Saturday 9/3   Difficult to place patient : unclear  Level of care: Med-Surg  Consultants:    Procedures:   Antimicrobials: cipro    Subjective: No significant overnight events, patient denied any chest pain or abdominal pain, no shortness of breath.  Denied any other active issues.  Objective: Vitals:   02/28/21 1554 02/28/21 2031 03/01/21 0340 03/01/21 0752  BP: 135/73 121/64 121/69 126/82  Pulse: 73 76 71 75  Resp: 17 18 18 18   Temp: (!) 97.5 F (36.4 C) 97.8 F (36.6 C) 98 F (36.7 C) (!) 97.5 F (36.4 C)  TempSrc: Oral Oral Oral Oral  SpO2: 100% 100% 100% 100%  Weight:      Height:         Intake/Output Summary (Last 24 hours) at 03/01/2021 1226 Last data filed at 03/01/2021 1026 Gross per 24 hour  Intake 600 ml  Output 575 ml  Net 25 ml   Filed Weights   02/23/21 1236  Weight: 112.9 kg    Examination:  General exam: Appears comfortable  Respiratory system: Clear breath sounds b/l  Cardiovascular system: S1/S2+. No rubs or clicks Gastrointestinal system: Abd is soft, NT, ostomy bag present w/ brown stool & normal bowel sounds  Central nervous system: Alert and oriented. Moves all extremities  Psychiatry: Judgement and insight appear normal. Flat mood and affect     Data Reviewed: I have personally reviewed following labs and imaging studies  CBC: Recent Labs  Lab 02/23/21 1255 02/24/21 0723 02/25/21 0440 02/26/21 0409 02/27/21 0428 02/28/21 0702 03/01/21 0523  WBC 15.0*   < > 8.0 6.7 6.5 6.6 6.0  NEUTROABS 13.0*  --   --   --   --   --   --   HGB 9.8*   < > 9.1* 8.8* 9.6* 9.5* 9.6*  HCT 30.3*   < > 28.2* 27.2* 30.3* 30.2* 30.1*  MCV 85.6   < > 83.9 85.3 85.8 85.8 83.8  PLT 220   < > 207 228 247 271 296   < > = values in this interval not displayed.   Basic Metabolic Panel: Recent Labs  Lab 02/25/21 0440 02/26/21 0409 02/27/21 0428 02/28/21 0702 03/01/21 0523  NA 133* 133* 134* 134* 131*  K 4.1 4.1 3.9 4.4 4.0  CL 107 109 106 103 100  CO2 21* 21* 21* 24 24  GLUCOSE 187* 140* 146* 117* 153*  BUN 17 13 12 12 15   CREATININE 1.43* 1.44* 1.31* 1.60* 1.82*  CALCIUM 8.1* 8.1* 8.6* 8.8* 8.4*  MG  --   --   --   --  2.0  PHOS  --   --   --   --  3.4   GFR: Estimated Creatinine Clearance: 47.7 mL/min (A) (by C-G formula based on SCr of 1.82 mg/dL (H)). Liver Function Tests: Recent Labs  Lab 02/23/21 1255  AST 16  ALT 10  ALKPHOS 64  BILITOT 0.7  PROT 7.9  ALBUMIN 2.8*   No results for input(s): LIPASE, AMYLASE in the last 168 hours. No results for input(s): AMMONIA in the last 168 hours. Coagulation Profile: No results for input(s):  INR, PROTIME in the last 168 hours. Cardiac Enzymes: No results for input(s): CKTOTAL, CKMB, CKMBINDEX, TROPONINI in the last 168 hours. BNP (last 3 results) No results for input(s): PROBNP in the last 8760 hours. HbA1C: No results for input(s): HGBA1C in the last 72 hours.  CBG: Recent Labs  Lab 02/28/21 1156 02/28/21 1639 02/28/21 2131 03/01/21 0752 03/01/21 1143  GLUCAP 164* 222* 161* 148* 178*  Lipid Profile: No results for input(s): CHOL, HDL, LDLCALC, TRIG, CHOLHDL, LDLDIRECT in the last 72 hours. Thyroid Function Tests: No results for input(s): TSH, T4TOTAL, FREET4, T3FREE, THYROIDAB in the last 72 hours. Anemia Panel: No results for input(s): VITAMINB12, FOLATE, FERRITIN, TIBC, IRON, RETICCTPCT in the last 72 hours. Sepsis Labs: Recent Labs  Lab 02/23/21 0723 02/23/21 1255  PROCALCITON 5.70  --   LATICACIDVEN  --  1.2    Recent Results (from the past 240 hour(s))  Blood culture (routine single)     Status: None   Collection Time: 02/23/21 12:55 PM   Specimen: BLOOD  Result Value Ref [place] Status   Specimen Description BLOOD LEFT ANTECUBITAL  Final   Special Requests   Final    BOTTLES DRAWN AEROBIC AND ANAEROBIC Blood Culture adequate volume   Culture   Final    NO GROWTH 5 DAYS Performed at [facility], [address] [place], [place] [address]    Report Status 02/28/2021 FINAL  Final  Urine Culture     Status: Abnormal   Collection Time: 02/23/21  1:21 PM   Specimen: Urine, Random  Result Value Ref [place] Status   Specimen Description   Final    URINE, RANDOM Performed at [facility], [address] [place], [place] [address]    Special Requests   Final    NONE Performed at [facility], [address], [place], [place] [address]    Culture >=100,000 COLONIES/mL PSEUDOMONAS AERUGINOSA (A)  Final   Report Status 02/26/2021 FINAL  Final   Organism ID, Bacteria PSEUDOMONAS AERUGINOSA (A)  Final      Susceptibility    Pseudomonas aeruginosa - MIC*    CEFTAZIDIME 4 SENSITIVE Sensitive     CIPROFLOXACIN <=0.25 SENSITIVE Sensitive     GENTAMICIN 4 SENSITIVE Sensitive     IMIPENEM 2 SENSITIVE Sensitive     PIP/TAZO 8 SENSITIVE Sensitive     * >=100,000 COLONIES/mL PSEUDOMONAS AERUGINOSA  Resp Panel by RT-PCR (Flu A&B, Covid) Nasopharyngeal Swab     Status: None   Collection Time: 02/23/21  2:34 PM   Specimen: Nasopharyngeal Swab; Nasopharyngeal(NP) swabs in vial transport medium  Result Value Ref [place] Status   SARS Coronavirus 2 by RT PCR NEGATIVE NEGATIVE Final    Comment: (NOTE) SARS-CoV-2 target nucleic acids are NOT DETECTED.  The SARS-CoV-2 RNA is generally detectable in upper respiratory specimens during the acute phase of infection. The lowest concentration of SARS-CoV-2 viral copies this assay can detect is 138 copies/mL. A negative result does not preclude SARS-Cov-2 infection and should not be used as the sole basis for treatment or other patient management decisions. A negative result may occur with  improper specimen collection/handling, submission of specimen other than nasopharyngeal swab, presence of viral mutation(s) within the areas targeted by this assay, and inadequate number of viral copies(<138 copies/mL). A negative result must be combined with clinical observations, patient history, and epidemiological information. The expected result is Negative.  Fact Sheet for Patients:  BloggerCourse.com  Fact Sheet for Healthcare Providers:  SeriousBroker.it  This test is no t yet approved or cleared by the [place] FDA and  has been authorized for detection and/or diagnosis of SARS-CoV-2 by FDA under an Emergency Use Authorization (EUA). This EUA will remain  in effect (meaning this test can be used) for the duration of the COVID-19 declaration under Section 564(b)(1) of the Act, 21 U.S.C.section 360bbb-3(b)(1), unless the  authorization is terminated  or revoked sooner.  Influenza A by PCR NEGATIVE NEGATIVE Final   Influenza B by PCR NEGATIVE NEGATIVE Final    Comment: (NOTE) The Xpert Xpress SARS-CoV-2/FLU/RSV plus assay is intended as an aid in the diagnosis of influenza from Nasopharyngeal swab specimens and should not be used as a sole basis for treatment. Nasal washings and aspirates are unacceptable for Xpert Xpress SARS-CoV-2/FLU/RSV testing.  Fact Sheet for Patients: BloggerCourse.com  Fact Sheet for Healthcare Providers: SeriousBroker.it  This test is not yet approved or cleared by the [place] FDA and has been authorized for detection and/or diagnosis of SARS-CoV-2 by FDA under an Emergency Use Authorization (EUA). This EUA will remain in effect (meaning this test can be used) for the duration of the COVID-19 declaration under Section 564(b)(1) of the Act, 21 U.S.C. section 360bbb-3(b)(1), unless the authorization is terminated or revoked.  Performed at [facility], [address] [place], [place] [address]   Culture, blood (single)     Status: None   Collection Time: 02/23/21  3:25 PM   Specimen: BLOOD  Result Value Ref [place] Status   Specimen Description BLOOD BLOOD RIGHT HAND  Final   Special Requests   Final    BOTTLES DRAWN AEROBIC AND ANAEROBIC Blood Culture results may not be optimal due to an inadequate volume of blood received in culture bottles   Culture   Final    NO GROWTH 5 DAYS Performed at [facility], [address] [place], [place] [address]    Report Status 02/28/2021 FINAL  Final         Radiology Studies: No results found.      Scheduled Meds:  amLODipine  5 mg Oral Daily   apixaban  5 mg Oral BID   Chlorhexidine Gluconate Cloth  6 each Topical Daily   insulin aspart  0-9 Units Subcutaneous TID WC   memantine  10 mg Oral BID   OXcarbazepine  300 mg Oral BID    QUEtiapine  100 mg Oral QHS   rosuvastatin  20 mg Oral Daily   sodium chloride flush  3 mL Intravenous Q12H   Continuous Infusions:  sodium chloride 75 mL/hr at 03/01/21 1010      LOS: 6 days    Time spent: 20 mins     Gillis Santa, MD [facility] Pager [phone]  If 7PM-7AM, please contact night-coverage 03/01/2021, 12:26 PM

## 2021-03-01 NOTE — TOC Progression Note (Addendum)
Transition of Care [facility]) - Progression Note    Patient Details  Name: [name] MRN: [id number] Date of Birth: 1955/09/23  [facility] of [facility]) CM/SW Contact  Margarito Liner, LCSW Phone Number: 03/01/2021, 10:35 AM  Clinical Narrative:  [facility] does have an air mattress available. Patient is aware and agreeable. Called and updated wife. She said he has had two COVID vaccines and a booster and will bring card to facility when she brings his belongings. Sent secure chat to MD to see if there is an anticipated discharge date.   2:58 pm: Plan for discharge to [facility] on Saturday but they will need discharge summary by 4:00 tomorrow. MD is aware. Auth started through [facility] portal.  Expected Discharge Plan: Skilled Nursing Facility Barriers to Discharge: Continued Medical Work up  Expected Discharge Plan and Services Expected Discharge Plan: Skilled Nursing Facility                                               Social Determinants of Health (SDOH) Interventions    Readmission Risk Interventions No flowsheet data found.

## 2021-03-01 NOTE — Progress Notes (Signed)
Physical Therapy Treatment Patient Details Name: [name] MRN: [id number] DOB: [birth year] Today's Date: 03/01/2021    History of Present Illness Pt is a 65 y/o M admitted on 02/23/21 for tx of UTI after presenting with c/o weakness x 1 week. PMH: TBI, neurogenic bladder with chronic indwelling foley, ostomy bag, morbidly obese, DM2, HTN, HLD, DVT on eliquis, bipolar disorder    PT Comments    Patient received in bed, agrees to PT session. Friend at bedside. Patient requires mod +2 assist for supine to sit and max +2 for sit to supine. Mod +2 for sit to stand and min for static standing. Min +2 for sidestepping at edge of bed. He will continue to benefit from skilled PT while here to improve strength, functional independence and safety.      Follow Up Recommendations  SNF     Equipment Recommendations  Other (comment);None recommended by PT (TBD)    Recommendations for Other Services       Precautions / Restrictions Precautions Precautions: Fall Precaution Comments: ostomy bag, chronic foley, sacral wound Restrictions Weight Bearing Restrictions: No    Mobility  Bed Mobility Overal bed mobility: Needs Assistance Bed Mobility: Supine to Sit;Sit to Supine     Supine to sit: Mod assist Sit to supine: Max assist;+2 for physical assistance   General bed mobility comments: +2 assist to return to supine from sitting eob, somwhat due to air mattress    Transfers Overall transfer level: Needs assistance Equipment used: Rolling walker (2 wheeled) Transfers: Sit to/from Stand Sit to Stand: Min assist;+2 physical assistance         General transfer comment: Verbal cues for body positioning. Cues for upright posture ( bottom in,  shoulders back) I beleive he was supporting self by leaning legs ob bed  Ambulation/Gait   Gait Distance (Feet): 3 Feet Assistive device: Rolling walker (2 wheeled) Gait Pattern/deviations: Step-to pattern;Decreased step length -  right;Decreased step length - left;Shuffle Gait velocity: decr   General Gait Details: patient able to side step along bed with +2 assist and cues for safety.   Stairs             Wheelchair Mobility    Modified Rankin (Stroke Patients Only)       Balance Overall balance assessment: Needs assistance Sitting-balance support: Feet supported Sitting balance-Leahy Scale: Fair Sitting balance - Comments: requires supervision   Standing balance support: Bilateral upper extremity supported;During functional activity Standing balance-Leahy Scale: Fair Standing balance comment: min A +2 for standing                            Cognition Arousal/Alertness: Awake/alert Behavior During Therapy: WFL for tasks assessed/performed Overall Cognitive Status: Within Functional Limits for tasks assessed                                 General Comments: Pt pleasant and motivated throughout session. Some decreased awareness of deficits      Exercises Other Exercises Other Exercises: STS x 5 reps    General Comments        Pertinent Vitals/Pain Pain Assessment: No/denies pain    Home Living                      Prior Function            PT Goals (current goals can now  be found in the care plan section) Acute Rehab PT Goals Patient Stated Goal: to get stronger PT Goal Formulation: With patient Time For Goal Achievement: 03/11/21 Potential to Achieve Goals: Good Progress towards PT goals: Progressing toward goals    Frequency    Min 2X/week      PT Plan Current plan remains appropriate    Co-evaluation              AM-PAC PT "6 Clicks" Mobility   Outcome Measure  Help needed turning from your back to your side while in a flat bed without using bedrails?: A Lot Help needed moving from lying on your back to sitting on the side of a flat bed without using bedrails?: A Lot Help needed moving to and from a bed to a chair  (including a wheelchair)?: A Lot Help needed standing up from a chair using your arms (e.g., wheelchair or bedside chair)?: A Lot Help needed to walk in hospital room?: A Lot Help needed climbing 3-5 steps with a railing? : Total 6 Click Score: 11    End of Session Equipment Utilized During Treatment: Gait belt Activity Tolerance: Patient tolerated treatment well Patient left: in bed;with call bell/phone within reach Nurse Communication: Mobility status PT Visit Diagnosis: Muscle weakness (generalized) (M62.81);Difficulty in walking, not elsewhere classified (R26.2);Other abnormalities of gait and mobility (R26.89)     Time: 1005-1029 PT Time Calculation (min) (ACUTE ONLY): 24 min  Charges:  $Therapeutic Exercise: 8-22 mins $Therapeutic Activity: 8-22 mins                    Noni Stonesifer, PT, GCS 03/01/21,1:40 PM

## 2021-03-02 DIAGNOSIS — N3001 Acute cystitis with hematuria: Secondary | ICD-10-CM | POA: Diagnosis not present

## 2021-03-02 LAB — PHOSPHORUS: Phosphorus: 3.1 mg/dL (ref 2.5–4.6)

## 2021-03-02 LAB — BASIC METABOLIC PANEL
Anion gap: 5 (ref 5–15)
BUN: 15 mg/dL (ref 8–23)
CO2: 23 mmol/L (ref 22–32)
Calcium: 8.3 mg/dL — ABNORMAL LOW (ref 8.9–10.3)
Chloride: 105 mmol/L (ref 98–111)
Creatinine, Ser: 1.56 mg/dL — ABNORMAL HIGH (ref 0.61–1.24)
GFR, Estimated: 49 mL/min — ABNORMAL LOW (ref >60)
Glucose, Bld: 140 mg/dL — ABNORMAL HIGH (ref 70–99)
Potassium: 4 mmol/L (ref 3.5–5.1)
Sodium: 133 mmol/L — ABNORMAL LOW (ref 135–145)

## 2021-03-02 LAB — CBC
HCT: 31.3 % — ABNORMAL LOW (ref 39.0–52.0)
Hemoglobin: 9.8 g/dL — ABNORMAL LOW (ref 13.0–17.0)
MCH: 26.6 pg (ref 26.0–34.0)
MCHC: 31.3 g/dL (ref 30.0–36.0)
MCV: 85.1 fL (ref 80.0–100.0)
Platelets: 313 10*3/uL (ref 150–400)
RBC: 3.68 MIL/uL — ABNORMAL LOW (ref 4.22–5.81)
RDW: 17.4 % — ABNORMAL HIGH (ref 11.5–15.5)
WBC: 5.2 10*3/uL (ref 4.0–10.5)
nRBC: 0 % (ref 0.0–0.2)

## 2021-03-02 LAB — SARS CORONAVIRUS 2 (TAT 6-24 HRS): SARS Coronavirus 2: NEGATIVE

## 2021-03-02 LAB — MAGNESIUM: Magnesium: 2 mg/dL (ref 1.7–2.4)

## 2021-03-02 LAB — GLUCOSE, CAPILLARY
Glucose-Capillary: 134 mg/dL — ABNORMAL HIGH (ref 70–99)
Glucose-Capillary: 139 mg/dL — ABNORMAL HIGH (ref 70–99)
Glucose-Capillary: 154 mg/dL — ABNORMAL HIGH (ref 70–99)
Glucose-Capillary: 164 mg/dL — ABNORMAL HIGH (ref 70–99)

## 2021-03-02 MED ORDER — AMLODIPINE BESYLATE 5 MG PO TABS: 5.0000 mg | ORAL_TABLET | Freq: Every day | ORAL | 0 refills | Status: DC

## 2021-03-02 NOTE — Progress Notes (Signed)
Dressing change done to right heel and sacrum per order. Pt tolerated with no problem.

## 2021-03-02 NOTE — Care Management Important Message (Signed)
Important Message  Patient Details  Name: [name] MRN: [id number] Date of Birth: 1955-12-22   Medicare Important Message Given:  Yes     [name] 03/02/2021, 4:59 PM

## 2021-03-02 NOTE — TOC Progression Note (Addendum)
Transition of Care [facility]) - Progression Note    Patient Details  Name: Jamil Armwood MRN: [id number] Date of Birth: 1955-10-06  Transition of Care [facility]) CM/SW Contact  Margarito Liner, LCSW Phone Number: 03/02/2021, 8:41 AM  Clinical Narrative:  Insurance authorization approved for SNF: [id number]. Valid 9/2-9/6.   3:45 pm: Discharge summary sent to [facility]. Admissions coordinator is aware.  Expected Discharge Plan: Skilled Nursing Facility Barriers to Discharge: Continued Medical Work up  Expected Discharge Plan and Services Expected Discharge Plan: Skilled Nursing Facility                                               Social Determinants of Health (SDOH) Interventions    Readmission Risk Interventions No flowsheet data found.

## 2021-03-02 NOTE — Progress Notes (Signed)
PROGRESS NOTE   HPI: 65 y/o M w/ PMH of TBI, neurogenic bladder w/ chronic indwelling foley placed over this last year, ostomy bag, morbidly obese, DM2, HTN, HLD, DVT on eliquis who presents w/ generalized weakness x 1 week. Pt is poor historian and hx was obtained from pt and previous progress notes. Pt denies pain, fever, chills, sweating, cough, chest pain, shortness of breath, nausea, vomiting, abd pain, dysuria, urinary frequency, urinary urgency, diarrhea, or constipation.    In the ED, pt was found to have UA was abnormal but pt denies any GU symptoms as pt a chronic indwelling foley. The foley is evidently has the foley changed monthly by [facility] RN. Pt lives with his wife and goddaughter.   Pt presented w/ sepsis likely secondary to UTI. Pt was initially treated w/ IV rocephin and switched to po cipro as per urine cx results. Sepsis has since resolved. Pt's foley should be changed today, messaged the pt's nurse. PT/OT evaluated the pt and recs SNF. Waiting SNF placement     Gershon Shorten  HMC:[id number] DOB: Dec 23, 1955 DOA: 02/23/2021 PCP: Dione Booze, MD   Assessment & Plan:   Active Problems:   UTI (urinary tract infection)  Sepsis: meets criteria w/ tachycardia, leukocytosis and likely UTI and possible sacral infection. Given IV cefepime, flagyl & vanco x 1 in ER. S/p po cipro, completed antibiotics for 7 days. Blood cxs NGTD. Urine cx growing pseudomonas. D/c IVFs. Pro-cal 5.70. Resolved   UTI:  urine cx is growing is pseudomonas. S/p po cipro, completed antibiotics for 7 days.  Dose was on 9/1  Neurogenic bladder: chronic indwelling foley placed this past year. Changed monthly by [facility] RN & likely last changed at the beginning of August 2022. Messaged nurse 8/30 to change foley   AKI on CKD: baseline Cr/GFR is unknown, currently stage IIIa.  Cr 1.44--1.3--1.8 --1.56 Held HCTZ and ACEI due to elevated creatinine Started IV fluid for hydration renal sonogram to ruled  out obstruction, chronic right-sided hydronephrosis. Discussed with urology, stated that patient has chronic left-sided hydronephrosis, no intervention, follow-up as an outpatient.    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 Continue w/ wound care    Right heel ulcer: present on admission. 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 Poor historian    Ostomy bag: present on admission. Unknown reason why it was initially placed. Continue w/ supportive care    DM2: likely poorly controlled. 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 H&& are labile    Morbidly obese: BMI 40.1. Complicates overall care & prognosis    DVT prophylaxis: eliquis  Code Status: full  Family Communication:  Disposition Plan:  PT/OT recs SNF and pt is agreeable   Status is: Inpatient  Remains inpatient appropriate because:Unsafe d/c plan, IV treatments appropriate due to intensity of illness or inability to take PO, and Inpatient level of care appropriate due to severity of illness  Dispo: The patient is from: Home              Anticipated d/c is to: SNF              Patient  currently is not medically stable to d/c.  Developed AKI, elevated creatinine, started IV fluid, most likely disposition on Saturday 9/3   Difficult to place patient : unclear    Level of care: Med-Surg  Consultants:  D/w urology for right-sided hydronephrosis, recommended to follow as an outpatient  Procedures:   Antimicrobials: cipro    Subjective: No significant overnight events, patient denied any chest pain or abdominal pain, no shortness of breath.  Denied any other active issues.  Objective: Vitals:   03/01/21 2058  03/02/21 0501 03/02/21 0828 03/02/21 1542  BP: 132/81 (!) 145/77 138/77 116/71  Pulse: 66 66 76 65  Resp: 18 16 18 18   Temp: (!) 97.5 F (36.4 C)  97.6 F (36.4 C) 97.7 F (36.5 C)  TempSrc: Oral   Oral  SpO2: 100% 100% 100% 100%  Weight:      Height:        Intake/Output Summary (Last 24 hours) at 03/02/2021 1706 Last data filed at 03/02/2021 1409 Gross per 24 hour  Intake 2491.2 ml  Output 475 ml  Net 2016.2 ml   Filed Weights   02/23/21 1236  Weight: 112.9 kg    Examination:  General exam: Appears comfortable  Respiratory system: Clear breath sounds b/l  Cardiovascular system: S1/S2+. No rubs or clicks Gastrointestinal system: Abd is soft, NT, ostomy bag present w/ brown stool & normal bowel sounds  Central nervous system: Alert and oriented. Moves all extremities  Psychiatry: Judgement and insight appear normal. Flat mood and affect     Data Reviewed: I have personally reviewed following labs and imaging studies  CBC: Recent Labs  Lab 02/26/21 0409 02/27/21 0428 02/28/21 0702 03/01/21 0523 03/02/21 0505  WBC 6.7 6.5 6.6 6.0 5.2  HGB 8.8* 9.6* 9.5* 9.6* 9.8*  HCT 27.2* 30.3* 30.2* 30.1* 31.3*  MCV 85.3 85.8 85.8 83.8 85.1  PLT 228 247 271 296 313   Basic Metabolic Panel: Recent Labs  Lab 02/26/21 0409 02/27/21 0428 02/28/21 0702 03/01/21 0523 03/02/21 0505  NA 133* 134* 134* 131* 133*  K 4.1 3.9 4.4 4.0 4.0  CL 109 106 103 100 105  CO2 21* 21* 24 24 23   GLUCOSE 140* 146* 117* 153* 140*  BUN 13 12 12 15 15   CREATININE 1.44* 1.31* 1.60* 1.82* 1.56*  CALCIUM 8.1* 8.6* 8.8* 8.4* 8.3*  MG  --   --   --  2.0 2.0  PHOS  --   --   --  3.4 3.1   GFR: Estimated Creatinine Clearance: 55.7 mL/min (A) (by C-G formula based on SCr of 1.56 mg/dL (H)). Liver Function Tests: No results for input(s): AST, ALT, ALKPHOS, BILITOT, PROT, ALBUMIN in the last 168 hours.  No results for input(s): LIPASE, AMYLASE in the last 168 hours. No results for input(s):  AMMONIA in the last 168 hours. Coagulation Profile: No results for input(s): INR, PROTIME in the last 168 hours. Cardiac Enzymes: No results for input(s): CKTOTAL, CKMB, CKMBINDEX, TROPONINI in the last 168 hours. BNP (last 3 results) No results for input(s): PROBNP in the last 8760 hours. HbA1C: No results for input(s): HGBA1C in the last 72 hours.  CBG: Recent Labs  Lab 03/01/21 1645 03/01/21 2217 03/02/21 0809 03/02/21 1149 03/02/21 1648  GLUCAP 191* 122* 134* 154* 164*   Lipid Profile: No results for input(s): CHOL, HDL, LDLCALC, TRIG, CHOLHDL, LDLDIRECT in the last 72 hours. Thyroid Function Tests: No results for input(s): TSH, T4TOTAL, FREET4, T3FREE, THYROIDAB in the last 72 hours.  Anemia Panel: No results for input(s): VITAMINB12, FOLATE, FERRITIN, TIBC, IRON, RETICCTPCT in the last 72 hours. Sepsis Labs: No results for input(s): PROCALCITON, LATICACIDVEN in the last 168 hours.   Recent Results (from the past 240 hour(s))  Blood culture (routine single)     Status: None   Collection Time: 02/23/21 12:55 PM   Specimen: BLOOD  Result Value Ref Range Status   Specimen Description BLOOD LEFT ANTECUBITAL  Final   Special Requests   Final    BOTTLES DRAWN AEROBIC AND ANAEROBIC Blood Culture adequate volume   Culture   Final    NO GROWTH 5 DAYS Performed at [facility], [address] [place], [place] [address]    Report Status 02/28/2021 FINAL  Final  Urine Culture     Status: Abnormal   Collection Time: 02/23/21  1:21 PM   Specimen: Urine, Random  Result Value Ref Range Status   Specimen Description   Final    URINE, RANDOM Performed at [facility], [address] [place], [place] [address]    Special Requests   Final    NONE Performed at [facility], [address] [place], [place] [address]    Culture >=100,000 COLONIES/mL PSEUDOMONAS AERUGINOSA (A)  Final   Report Status 02/26/2021 FINAL  Final   Organism ID, Bacteria  PSEUDOMONAS AERUGINOSA (A)  Final      Susceptibility   Pseudomonas aeruginosa - MIC*    CEFTAZIDIME 4 SENSITIVE Sensitive     CIPROFLOXACIN <=0.25 SENSITIVE Sensitive     GENTAMICIN 4 SENSITIVE Sensitive     IMIPENEM 2 SENSITIVE Sensitive     PIP/TAZO 8 SENSITIVE Sensitive     * >=100,000 COLONIES/mL PSEUDOMONAS AERUGINOSA  Resp Panel by RT-PCR (Flu A&B, Covid) Nasopharyngeal Swab     Status: None   Collection Time: 02/23/21  2:34 PM   Specimen: Nasopharyngeal Swab; Nasopharyngeal(NP) swabs in vial transport medium  Result Value Ref Range Status   SARS Coronavirus 2 by RT PCR NEGATIVE NEGATIVE Final    Comment: (NOTE) SARS-CoV-2 target nucleic acids are NOT DETECTED.  The SARS-CoV-2 RNA is generally detectable in upper respiratory specimens during the acute phase of infection. The lowest concentration of SARS-CoV-2 viral copies this assay can detect is 138 copies/mL. A negative result does not preclude SARS-Cov-2 infection and should not be used as the sole basis for treatment or other patient management decisions. A negative result may occur with  improper specimen collection/handling, submission of specimen other than nasopharyngeal swab, presence of viral mutation(s) within the areas targeted by this assay, and inadequate number of viral copies(<138 copies/mL). A negative result must be combined with clinical observations, patient history, and epidemiological information. The expected result is Negative.  Fact Sheet for Patients:  BloggerCourse.comhttps://www.fda.gov/media/152166/download  Fact Sheet for Healthcare Providers:  SeriousBroker.ithttps://www.fda.gov/media/152162/download  This test is no t yet approved or cleared by the [place] FDA and  has been authorized for detection and/or diagnosis of SARS-CoV-2 by FDA under an Emergency Use Authorization (EUA). This EUA will remain  in effect (meaning this test can be used) for the duration of the COVID-19 declaration under Section 564(b)(1) of  the Act, 21 U.S.C.section 360bbb-3(b)(1), unless the authorization is terminated  or revoked sooner.       Influenza A by PCR NEGATIVE NEGATIVE Final   Influenza B by PCR NEGATIVE NEGATIVE Final    Comment: (NOTE) The Xpert Xpress SARS-CoV-2/FLU/RSV plus assay is intended as an aid in the diagnosis of influenza from  Nasopharyngeal swab specimens and should not be used as a sole basis for treatment. Nasal washings and aspirates are unacceptable for Xpert Xpress SARS-CoV-2/FLU/RSV testing.  Fact Sheet for Patients: BloggerCourse.com  Fact Sheet for Healthcare Providers: SeriousBroker.it  This test is not yet approved or cleared by the [place] FDA and has been authorized for detection and/or diagnosis of SARS-CoV-2 by FDA under an Emergency Use Authorization (EUA). This EUA will remain in effect (meaning this test can be used) for the duration of the COVID-19 declaration under Section 564(b)(1) of the Act, 21 U.S.C. section 360bbb-3(b)(1), unless the authorization is terminated or revoked.  Performed at [facility], [address] [place], [place] [address]   Culture, blood (single)     Status: None   Collection Time: 02/23/21  3:25 PM   Specimen: BLOOD  Result Value Ref Range Status   Specimen Description BLOOD BLOOD RIGHT HAND  Final   Special Requests   Final    BOTTLES DRAWN AEROBIC AND ANAEROBIC Blood Culture results may not be optimal due to an inadequate volume of blood received in culture bottles   Culture   Final    NO GROWTH 5 DAYS Performed at [facility], [address] [place], [place] [address]    Report Status 02/28/2021 FINAL  Final         Radiology Studies: US RENAL  Result Date: 03/01/2021 CLINICAL DATA:  AK I EXAM: RENAL / URINARY TRACT ULTRASOUND COMPLETE COMPARISON:  Renal ultrasound dated August 22, 2020 FINDINGS: Right Kidney: Renal measurements: 11.2 x 5.8 x 5.9  cm = volume: 199 mL. Increased cortical echogenicity. Mild to moderate hydronephrosis. Left Kidney: Renal measurements: 11.0 x 6.0 x 5.5 cm = volume: 186 mL. Increased cortical echogenicity. No hydronephrosis. Bladder: Possible bladder wall thickening.  Foley catheter in place. Other: None. IMPRESSION: Mild-to-moderate right hydronephrosis, stable to slightly increased compared to prior renal ultrasound. Possible bladder wall thickening. Foley catheter present making assessment of urinary bladder by ultrasound limited. Increased cortical echogenicity, findings can be seen the setting of medical renal disease. Electronically Signed   By: Allegra Lai M.D.   On: 03/01/2021 12:58        Scheduled Meds:  amLODipine  5 mg Oral Daily   apixaban  5 mg Oral BID   Chlorhexidine Gluconate Cloth  6 each Topical Daily   insulin aspart  0-9 Units Subcutaneous TID WC   memantine  10 mg Oral BID   OXcarbazepine  300 mg Oral BID   QUEtiapine  100 mg Oral QHS   rosuvastatin  20 mg Oral Daily   sodium chloride flush  3 mL Intravenous Q12H   Continuous Infusions:      LOS: 7 days    Time spent: 20 mins     Gillis Santa, MD [facility] Pager [phone]  If 7PM-7AM, please contact night-coverage 03/02/2021, 5:06 PM

## 2021-03-02 NOTE — Progress Notes (Signed)
Occupational Therapy Treatment Patient Details Name: [name] MRN: [id number] DOB: 07-11-1955 Today's Date: 03/02/2021    History of present illness Pt is a 65 y/o M admitted on 02/23/21 for tx of UTI after presenting with c/o weakness x 1 week. PMH: TBI, neurogenic bladder with chronic indwelling foley, ostomy bag, morbidly obese, DM2, HTN, HLD, DVT on eliquis, bipolar disorder   OT comments  Pt seen this date for OT tx session with focus on UB strengthening, ROM and participation in ADL tasks.  Pt requires setup for grooming tasks.  Pt seen this date for UB ROM and strengthening, towel exercises for overhead press, chest press, forwards, backwards circles, elbow flexion, extension, wrist flexion/extension, 10 reps for 2-3 sets each.  Towel knotting for hand strengthening to unknot with resistance, 5 reps each.  Cues for proper form and technique with exercises.  Pt progressing well towards goals and may discharge soon with bed available at [facility] for Saturday.   Continue towards goals in POC to maximize safety and independence in necessary daily ADL tasks.    Follow Up Recommendations  SNF    Equipment Recommendations       Recommendations for Other Services      Precautions / Restrictions Precautions Precautions: Fall Precaution Comments: ostomy bag, chronic foley, sacral wound Restrictions Weight Bearing Restrictions: No       Mobility Bed Mobility                    Transfers                      Balance                                           ADL either performed or assessed with clinical judgement   ADL Overall ADL's : Needs assistance/impaired Eating/Feeding: Bed level;Set up   Grooming: Wash/dry hands;Wash/dry face;Applying deodorant;Supervision/safety;Set up;Sitting                                       Vision       Perception     Praxis      Cognition Arousal/Alertness:  Awake/alert Behavior During Therapy: WFL for tasks assessed/performed Overall Cognitive Status: Within Functional Limits for tasks assessed                                          Exercises Other Exercises Other Exercises: Pt seen this date for UB ROM and strengthening, towel exercises for overhead press, chest press, forwards, backwards circles, elbow flexion, extension, wrist flexion/extension, 10 reps for 2-3 sets each.  Towel knotting for hand strengthening to unknot with resistance, 5 reps each.  Cues for proper form and technique with exercises.   Shoulder Instructions       General Comments      Pertinent Vitals/ Pain       Pain Assessment: No/denies pain  Home Living                                          Prior Functioning/Environment  Frequency  Min 1X/week        Progress Toward Goals  OT Goals(current goals can now be found in the care plan section)  Progress towards OT goals: Progressing toward goals  Acute Rehab OT Goals Patient Stated Goal: to get stronger OT Goal Formulation: With patient Time For Goal Achievement: 03/11/21 Potential to Achieve Goals: Fair  Plan Discharge plan remains appropriate;Frequency remains appropriate    Co-evaluation                 AM-PAC OT "6 Clicks" Daily Activity     Outcome Measure   Help from another person eating meals?: None Help from another person taking care of personal grooming?: A Little Help from another person toileting, which includes using toliet, bedpan, or urinal?: Total Help from another person bathing (including washing, rinsing, drying)?: A Lot Help from another person to put on and taking off regular upper body clothing?: A Little Help from another person to put on and taking off regular lower body clothing?: A Lot 6 Click Score: 15    End of Session    OT Visit Diagnosis: Muscle weakness (generalized) (M62.81)   Activity Tolerance  Patient tolerated treatment well   Patient Left in bed;with call bell/phone within reach;with bed alarm set   Nurse Communication          Time: 9163-8466 OT Time Calculation (min): 27 min  Charges: OT General Charges $OT Visit: 1 Visit OT Treatments $Self Care/Home Management : 8-22 mins $Therapeutic Exercise: 8-22 mins  [name] T [name], OTR/L, CLT    [name] 03/02/2021, 10:27 AM

## 2021-03-02 NOTE — Discharge Summary (Addendum)
[facility] Discharge Summary   [name] GBT:[id number]  PCP: Dione Booze, MD  Date of admission: 02/23/2021   Date of discharge:  03/03/2021     Discharge Diagnoses:  Principal diagnosis UTI Active Problems:   UTI (urinary tract infection)   Admitted From: SNF Disposition:  SNF   Recommendations for Outpatient Follow-up:  PCP: in 1 wk Urology in 1-2 wks  Follow up LABS/TEST:  BMP and CBC after 1 wk   Contact information for after-discharge care     Destination     [facility] [place] [facility] .   Service: Skilled Nursing Contact information: [address] [place] [address] [phone]                    Diet recommendation: Cardiac diet, encouraged to increase oral hydration  Activity: The patient is advised to gradually reintroduce usual activities, as tolerated  Discharge Condition: stable  Code Status: Full code   History of present illness: As per the H and P dictated on admission Hospital Course:  65 y/o M w/ PMH of TBI, neurogenic bladder w/ chronic indwelling foley placed over this last year, ostomy bag, morbidly obese, DM2, HTN, HLD, DVT on eliquis who presents w/ generalized weakness x 1 week. Pt is poor historian and hx was obtained from pt and previous progress notes. Pt denies pain, fever, chills, sweating, cough, chest pain, shortness of breath, nausea, vomiting, abd pain, dysuria, urinary frequency, urinary urgency, diarrhea, or constipation.  In the ED, pt was found to have UA was abnormal but pt denies any GU symptoms as pt a chronic indwelling foley. The foley is evidently has the foley changed monthly by [facility] RN. Pt lives with his wife and goddaughter.  Pt presented w/ sepsis likely secondary to UTI. Pt was initially treated w/ IV rocephin and switched to po cipro as per urine cx results. Sepsis has since resolved. Pt's foley should be changed today, messaged the pt's nurse. PT/OT  evaluated the pt and recs SNF, plan for dc today on 9/3.    Assessment & Plan: Active Problems:   UTI (urinary tract infection) # Sepsis: meets criteria w/ tachycardia, leukocytosis and likely UTI and possible sacral infection. Given IV cefepime, flagyl & vanco x 1 in ER. Continue on po cipro. Blood cxs NGTD. Urine cx growing pseudomonas. D/c IVFs. Pro-cal 5.70. Resolved # UTI:  urine cx is growing is pseudomonas. Continue on po cipro x 7 days  # Neurogenic bladder: chronic indwelling foley placed this past year. Changed monthly by [facility] RN & likely last changed at the beginning of August 2022. Messaged nurse 8/30 to change foley.  Next Foley catheter changed on 03/29/2021 # AKI on CKD: baseline Cr/GFR is unknown, currently stage IIIa., Cr 1.44--1.3--1.8 --1.56 Held HCTZ and ACEI due to elevated creatinine, s/p IV fluid for hydration. renal sonogram ruled out obstruction, chronic right hydronephrosis, patient will need follow-up with urology as an outpatient. 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 Continue w/ wound care  # Right heel ulcer: present on admission. Continue w/ wound care  # Hyponatremia: Sodium level fluctuating, repeat BMP next week # HLD: continue on statin  # HTN: continue on home dose of amlodipine, discontinued other home medications.  Continue to monitor BP and titrate medication accordingly. 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 Poor historian  # Ostomy bag: present on admission. Unknown reason  why it was initially placed. Continue w/ supportive care  # DM2: likely poorly controlled. Continue on SSI w/ accuchecks  # Leukocytosis: resolved  # Morbidly obese: BMI 40.1. Complicates overall care & prognosis    Pressure Injury 01/16/20 Sacrum Medial;Posterior Stage 4 - Full thickness tissue loss with exposed bone, tendon or muscle. Large open with granulated tissue present. (Active)  01/16/20 2030  Location: Sacrum   Location Orientation: Medial;Posterior  Staging: Stage 4 - Full thickness tissue loss with exposed bone, tendon or muscle.  Wound Description (Comments): Large open with granulated tissue present.  Present on Admission: Yes     Pressure Injury 01/16/20 Heel Right;Posterior Stage 2 -  Partial thickness loss of dermis presenting as a shallow open injury with a red, pink wound bed without slough. stage 2 pressure injury surrounded by deep tissue injury (Active)  01/16/20 2030  Location: Heel  Location Orientation: Right;Posterior  Staging: Stage 2 -  Partial thickness loss of dermis presenting as a shallow open injury with a red, pink wound bed without slough.  Wound Description (Comments): stage 2 pressure injury surrounded by deep tissue injury  Present on Admission: Yes      Patient was seen by physical therapy, who recommended SNF which was arranged. On the day of the discharge the patient's vitals were stable, and no other acute medical condition were reported by patient. the patient was felt safe to be discharge at [facility].  Consultants: None, openio from urology f/u out pt Procedures: Foley catheter was exchanged  Discharge Exam: General: Appear in no distress, no Rash; Oral Mucosa Clear, moist. Cardiovascular: S1 and S2 Present, no Murmur, Respiratory: normal respiratory effort, Bilateral Air entry present and no Crackles, no wheezes Abdomen: Bowel Sound present, Soft and no tenderness, no hernia, ileostomy bag intact Extremities: no Pedal edema, no calf tenderness Neurology: alert and NAD affect appropriate.  Filed Weights   02/23/21 1236  Weight: 112.9 kg   Vitals:   03/02/21 0501 03/02/21 0828  BP: (!) 145/77 138/77  Pulse: 66 76  Resp: 16 18  Temp:  97.6 F (36.4 C)  SpO2: 100% 100%    DISCHARGE MEDICATION: Allergies as of 03/02/2021   No Known Allergies      Medication List     STOP taking these medications    Olmesartan-amLODIPine-HCTZ 20-5-12.5 MG Tabs    olmesartan-hydrochlorothiazide 20-12.5 MG tablet Commonly known as: BENICAR HCT       TAKE these medications    amLODipine 5 MG tablet Commonly known as: NORVASC Take 1 tablet (5 mg total) by mouth daily. Start taking on: March 03, 2021   ascorbic acid 250 MG tablet Commonly known as: VITAMIN C Take by mouth.   clotrimazole 1 % cream Commonly known as: LOTRIMIN Apply topically twice daily for 1 week.   Eliquis 5 MG Tabs tablet Generic drug: apixaban Take 5 mg by mouth 2 (two) times daily.   glipiZIDE 10 MG 24 hr tablet Commonly known as: GLUCOTROL XL Take 10 mg by mouth daily with breakfast.   memantine 10 MG tablet Commonly known as: NAMENDA Take 10 mg by mouth 2 (two) times daily.   Omega-3 1000 MG Caps Take by mouth.   Oxcarbazepine 300 MG tablet Commonly known as: TRILEPTAL Take 1 tablet (300 mg total) by mouth 2 (two) times daily.   QUEtiapine 50 MG tablet Commonly known as: SEROquel Take 1 tablet (50 mg total) by mouth 2 (two) times daily as needed. For severe anxiety and agitation What  changed: when to take this   QUEtiapine 100 MG tablet Commonly known as: SEROQUEL Take 1 tablet (100 mg total) by mouth at bedtime. What changed: Another medication with the same name was changed. Make sure you understand how and when to take each.   rosuvastatin 20 MG tablet Commonly known as: CRESTOR Take 20 mg by mouth daily.   vitamin B-12 1000 MCG tablet Commonly known as: CYANOCOBALAMIN Take 3,000 mcg by mouth daily.       No Known Allergies   The results of significant diagnostics from this hospitalization (including imaging, microbiology, ancillary and laboratory) are listed below for reference.    Significant Diagnostic Studies: US RENAL  Result Date: 03/01/2021 CLINICAL DATA:  AK I EXAM: RENAL / URINARY TRACT ULTRASOUND COMPLETE COMPARISON:  Renal ultrasound dated August 22, 2020 FINDINGS: Right Kidney: Renal measurements: 11.2 x 5.8 x 5.9 cm =  volume: 199 mL. Increased cortical echogenicity. Mild to moderate hydronephrosis. Left Kidney: Renal measurements: 11.0 x 6.0 x 5.5 cm = volume: 186 mL. Increased cortical echogenicity. No hydronephrosis. Bladder: Possible bladder wall thickening.  Foley catheter in place. Other: None. IMPRESSION: Mild-to-moderate right hydronephrosis, stable to slightly increased compared to prior renal ultrasound. Possible bladder wall thickening. Foley catheter present making assessment of urinary bladder by ultrasound limited. Increased cortical echogenicity, findings can be seen the setting of medical renal disease. Electronically Signed   By: Allegra Lai M.D.   On: 03/01/2021 12:58   DG Chest Port 1 View  Result Date: 02/23/2021 CLINICAL DATA:  Sepsis EXAM: PORTABLE CHEST 1 VIEW COMPARISON:  Chest x-ray dated March 18, 2020 FINDINGS: Cardiac and mediastinal contours are unchanged and within normal limits. No focal consolidation. No large pleural effusion or pneumothorax. IMPRESSION: No acute cardiopulmonary abnormality. Electronically Signed   By: Allegra Lai M.D.   On: 02/23/2021 14:33    Microbiology: Recent Results (from the past 240 hour(s))  Blood culture (routine single)     Status: None   Collection Time: 02/23/21 12:55 PM   Specimen: BLOOD  Result Value Ref Range Status   Specimen Description BLOOD LEFT ANTECUBITAL  Final   Special Requests   Final    BOTTLES DRAWN AEROBIC AND ANAEROBIC Blood Culture adequate volume   Culture   Final    NO GROWTH 5 DAYS Performed at [facility], [address] [place], [place] [address]    Report Status 02/28/2021 FINAL  Final  Urine Culture     Status: Abnormal   Collection Time: 02/23/21  1:21 PM   Specimen: Urine, Random  Result Value Ref Range Status   Specimen Description   Final    URINE, RANDOM Performed at [facility], [address] [place], [place] [address]    Special Requests   Final    NONE Performed at  [facility], [address], [place], [place] [address]    Culture >=100,000 COLONIES/mL PSEUDOMONAS AERUGINOSA (A)  Final   Report Status 02/26/2021 FINAL  Final   Organism ID, Bacteria PSEUDOMONAS AERUGINOSA (A)  Final      Susceptibility   Pseudomonas aeruginosa - MIC*    CEFTAZIDIME 4 SENSITIVE Sensitive     CIPROFLOXACIN <=0.25 SENSITIVE Sensitive     GENTAMICIN 4 SENSITIVE Sensitive     IMIPENEM 2 SENSITIVE Sensitive     PIP/TAZO 8 SENSITIVE Sensitive     * >=100,000 COLONIES/mL PSEUDOMONAS AERUGINOSA  Resp Panel by RT-PCR (Flu A&B, Covid) Nasopharyngeal Swab     Status: None  Collection Time: 02/23/21  2:34 PM   Specimen: Nasopharyngeal Swab; Nasopharyngeal(NP) swabs in vial transport medium  Result Value Ref Range Status   SARS Coronavirus 2 by RT PCR NEGATIVE NEGATIVE Final    Comment: (NOTE) SARS-CoV-2 target nucleic acids are NOT DETECTED.  The SARS-CoV-2 RNA is generally detectable in upper respiratory specimens during the acute phase of infection. The lowest concentration of SARS-CoV-2 viral copies this assay can detect is 138 copies/mL. A negative result does not preclude SARS-Cov-2 infection and should not be used as the sole basis for treatment or other patient management decisions. A negative result may occur with  improper specimen collection/handling, submission of specimen other than nasopharyngeal swab, presence of viral mutation(s) within the areas targeted by this assay, and inadequate number of viral copies(<138 copies/mL). A negative result must be combined with clinical observations, patient history, and epidemiological information. The expected result is Negative.  Fact Sheet for Patients:  BloggerCourse.comhttps://www.fda.gov/media/152166/download  Fact Sheet for Healthcare Providers:  SeriousBroker.ithttps://www.fda.gov/media/152162/download  This test is no t yet approved or cleared by the [place] FDA and  has been authorized for detection and/or diagnosis of  SARS-CoV-2 by FDA under an Emergency Use Authorization (EUA). This EUA will remain  in effect (meaning this test can be used) for the duration of the COVID-19 declaration under Section 564(b)(1) of the Act, 21 U.S.C.section 360bbb-3(b)(1), unless the authorization is terminated  or revoked sooner.       Influenza A by PCR NEGATIVE NEGATIVE Final   Influenza B by PCR NEGATIVE NEGATIVE Final    Comment: (NOTE) The Xpert Xpress SARS-CoV-2/FLU/RSV plus assay is intended as an aid in the diagnosis of influenza from Nasopharyngeal swab specimens and should not be used as a sole basis for treatment. Nasal washings and aspirates are unacceptable for Xpert Xpress SARS-CoV-2/FLU/RSV testing.  Fact Sheet for Patients: BloggerCourse.comhttps://www.fda.gov/media/152166/download  Fact Sheet for Healthcare Providers: SeriousBroker.ithttps://www.fda.gov/media/152162/download  This test is not yet approved or cleared by the [place] FDA and has been authorized for detection and/or diagnosis of SARS-CoV-2 by FDA under an Emergency Use Authorization (EUA). This EUA will remain in effect (meaning this test can be used) for the duration of the COVID-19 declaration under Section 564(b)(1) of the Act, 21 U.S.C. section 360bbb-3(b)(1), unless the authorization is terminated or revoked.  Performed at [facility], [address] [place], [place] [address]   Culture, blood (single)     Status: None   Collection Time: 02/23/21  3:25 PM   Specimen: BLOOD  Result Value Ref Range Status   Specimen Description BLOOD BLOOD RIGHT HAND  Final   Special Requests   Final    BOTTLES DRAWN AEROBIC AND ANAEROBIC Blood Culture results may not be optimal due to an inadequate volume of blood received in culture bottles   Culture   Final    NO GROWTH 5 DAYS Performed at [facility], [address] [place], [place] [address]    Report Status 02/28/2021 FINAL  Final     Labs: CBC: Recent Labs  Lab 02/26/21 0409  02/27/21 0428 02/28/21 0702 03/01/21 0523 03/02/21 0505  WBC 6.7 6.5 6.6 6.0 5.2  HGB 8.8* 9.6* 9.5* 9.6* 9.8*  HCT 27.2* 30.3* 30.2* 30.1* 31.3*  MCV 85.3 85.8 85.8 83.8 85.1  PLT 228 247 271 296 313   Basic Metabolic Panel: Recent Labs  Lab 02/26/21 0409 02/27/21 0428 02/28/21 0702 03/01/21 0523 03/02/21 0505  NA 133* 134* 134* 131* 133*  K 4.1 3.9 4.4 4.0  4.0  CL 109 106 103 100 105  CO2 21* 21* 24 24 23   GLUCOSE 140* 146* 117* 153* 140*  BUN 13 12 12 15 15   CREATININE 1.44* 1.31* 1.60* 1.82* 1.56*  CALCIUM 8.1* 8.6* 8.8* 8.4* 8.3*  MG  --   --   --  2.0 2.0  PHOS  --   --   --  3.4 3.1   Liver Function Tests: No results for input(s): AST, ALT, ALKPHOS, BILITOT, PROT, ALBUMIN in the last 168 hours. No results for input(s): LIPASE, AMYLASE in the last 168 hours. No results for input(s): AMMONIA in the last 168 hours. Cardiac Enzymes: No results for input(s): CKTOTAL, CKMB, CKMBINDEX, TROPONINI in the last 168 hours. BNP (last 3 results) Recent Labs    03/18/20 1215  BNP 40.5   CBG: Recent Labs  Lab 03/01/21 1143 03/01/21 1645 03/01/21 2217 03/02/21 0809 03/02/21 1149  GLUCAP 178* 191* 122* 134* 154*    Time spent: 35 minutes  Signed:  05/02/21  [facility]  03/02/2021 3:34 PM

## 2021-03-03 DIAGNOSIS — N3001 Acute cystitis with hematuria: Secondary | ICD-10-CM | POA: Diagnosis not present

## 2021-03-03 LAB — GLUCOSE, CAPILLARY
Glucose-Capillary: 151 mg/dL — ABNORMAL HIGH (ref 70–99)
Glucose-Capillary: 169 mg/dL — ABNORMAL HIGH (ref 70–99)
Glucose-Capillary: 130 mg/dL — ABNORMAL HIGH (ref 70–99)

## 2021-03-03 LAB — BASIC METABOLIC PANEL
Anion gap: 4 — ABNORMAL LOW (ref 5–15)
BUN: 16 mg/dL (ref 8–23)
CO2: 24 mmol/L (ref 22–32)
Calcium: 8.3 mg/dL — ABNORMAL LOW (ref 8.9–10.3)
Chloride: 108 mmol/L (ref 98–111)
Creatinine, Ser: 1.51 mg/dL — ABNORMAL HIGH (ref 0.61–1.24)
GFR, Estimated: 51 mL/min — ABNORMAL LOW (ref >60)
Glucose, Bld: 158 mg/dL — ABNORMAL HIGH (ref 70–99)
Potassium: 4.1 mmol/L (ref 3.5–5.1)
Sodium: 136 mmol/L (ref 135–145)

## 2021-03-03 LAB — CBC
HCT: 30 % — ABNORMAL LOW (ref 39.0–52.0)
Hemoglobin: 9.5 g/dL — ABNORMAL LOW (ref 13.0–17.0)
MCH: 27.2 pg (ref 26.0–34.0)
MCHC: 31.7 g/dL (ref 30.0–36.0)
MCV: 86 fL (ref 80.0–100.0)
Platelets: 316 10*3/uL (ref 150–400)
RBC: 3.49 MIL/uL — ABNORMAL LOW (ref 4.22–5.81)
RDW: 17.3 % — ABNORMAL HIGH (ref 11.5–15.5)
WBC: 5 10*3/uL (ref 4.0–10.5)
nRBC: 0 % (ref 0.0–0.2)

## 2021-03-03 LAB — PHOSPHORUS: Phosphorus: 3.1 mg/dL (ref 2.5–4.6)

## 2021-03-03 LAB — MAGNESIUM: Magnesium: 2 mg/dL (ref 1.7–2.4)

## 2021-03-03 NOTE — Progress Notes (Signed)
Report given to [employer], LPN at [facility], Ivs removed, heel dressing changed, & patient's wife aware.

## 2021-03-03 NOTE — TOC Progression Note (Signed)
Transition of Care [facility]) - Progression Note    Patient Details  Name: [name] MRN: [id number] Date of Birth: 08-19-1955  [facility] CM/SW Contact  Liliana Cline, LCSW Phone Number: 03/03/2021, 9:16 AM  Clinical Narrative:   Patient is supposed to DC to [facility] today. CSW left VM for Gavin Pound in Admissions requesting a return call as soon as possible with room # and # for report.    Expected Discharge Plan: Skilled Nursing Facility Barriers to Discharge: Continued Medical Work up  Expected Discharge Plan and Services Expected Discharge Plan: Skilled Nursing Facility                                               Social Determinants of Health (SDOH) Interventions    Readmission Risk Interventions No flowsheet data found.

## 2021-03-03 NOTE — TOC Transition Note (Addendum)
Transition of Care [facility]) - CM/SW Discharge Note   Patient Details  Name: [name] MRN: [id number] Date of Birth: 1955-07-12  [facility]) CM/SW Contact:  Liliana Cline, LCSW Phone Number: 03/03/2021, 12:36 PM   Clinical Narrative:   Patient to discharge to [facility] today, Room 305B. Confirmed with Gavin Pound at [facility]. CSW updated MD, RN, patient/family. Asked RN to call report and MD to submit DC Summary. Medical Necessity Form and Face Sheet placed in Discharge Packet by patient chart.  Waiting for RN to notify me that patient is ready for EMS transport.  Asked RN to call wife when EMS picks patient up and to answer medical questions per wife's request.   2:10[facility] called for transport. Patient is 7th on the list. RN notified.   Final next level of care: Skilled Nursing Facility Barriers to Discharge: Barriers Resolved   Patient Goals and CMS Choice Patient states their goals for this hospitalization and ongoing recovery are:: SNF CMS Medicare.gov Compare Post Acute Care list provided to:: Patient Represenative (must comment) Choice offered to / list presented to : Spouse  Discharge Placement              Patient [facility] bed at: [facility] Patient to be transferred to facility by: EMS Name of family member notified: [name] - wife Patient and family notified of of transfer: 03/03/21  Discharge Plan and Services                                     Social Determinants of Health (SDOH) Interventions     Readmission Risk Interventions No flowsheet data found.

## 2021-03-13 ENCOUNTER — Ambulatory Visit: Payer: Medicare Other | Admitting: Psychiatry

## 2021-03-28 ENCOUNTER — Ambulatory Visit: Payer: Medicare Other | Admitting: Urology

## 2021-04-02 ENCOUNTER — Encounter: Payer: Self-pay | Admitting: Urology

## 2021-04-13 ENCOUNTER — Emergency Department: Payer: Medicare Other

## 2021-04-13 ENCOUNTER — Other Ambulatory Visit: Payer: Self-pay

## 2021-04-13 ENCOUNTER — Inpatient Hospital Stay
Admission: EM | Admit: 2021-04-13 | Discharge: 2021-04-16 | DRG: 871 | Disposition: A | Discharge status: Home Health | Payer: Medicare Other | Attending: Internal Medicine | Admitting: Internal Medicine

## 2021-04-13 ENCOUNTER — Encounter: Payer: Self-pay | Admitting: Emergency Medicine

## 2021-04-13 DIAGNOSIS — A419 Sepsis, unspecified organism: Secondary | ICD-10-CM | POA: Diagnosis not present

## 2021-04-13 DIAGNOSIS — F09 Unspecified mental disorder due to known physiological condition: Secondary | ICD-10-CM

## 2021-04-13 DIAGNOSIS — L97412 Non-pressure chronic ulcer of right heel and midfoot with fat layer exposed: Secondary | ICD-10-CM

## 2021-04-13 DIAGNOSIS — Z7984 Long term (current) use of oral hypoglycemic drugs: Secondary | ICD-10-CM

## 2021-04-13 DIAGNOSIS — I129 Hypertensive chronic kidney disease with stage 1 through stage 4 chronic kidney disease, or unspecified chronic kidney disease: Secondary | ICD-10-CM | POA: Diagnosis present

## 2021-04-13 DIAGNOSIS — Z23 Encounter for immunization: Secondary | ICD-10-CM

## 2021-04-13 DIAGNOSIS — N189 Chronic kidney disease, unspecified: Secondary | ICD-10-CM

## 2021-04-13 DIAGNOSIS — F319 Bipolar disorder, unspecified: Secondary | ICD-10-CM | POA: Diagnosis present

## 2021-04-13 DIAGNOSIS — L97519 Non-pressure chronic ulcer of other part of right foot with unspecified severity: Secondary | ICD-10-CM | POA: Diagnosis present

## 2021-04-13 DIAGNOSIS — Z6841 Body Mass Index (BMI) 40.0 and over, adult: Secondary | ICD-10-CM

## 2021-04-13 DIAGNOSIS — E785 Hyperlipidemia, unspecified: Secondary | ICD-10-CM | POA: Diagnosis present

## 2021-04-13 DIAGNOSIS — F411 Generalized anxiety disorder: Secondary | ICD-10-CM | POA: Diagnosis present

## 2021-04-13 DIAGNOSIS — D638 Anemia in other chronic diseases classified elsewhere: Secondary | ICD-10-CM | POA: Diagnosis present

## 2021-04-13 DIAGNOSIS — E871 Hypo-osmolality and hyponatremia: Secondary | ICD-10-CM | POA: Diagnosis present

## 2021-04-13 DIAGNOSIS — Z79899 Other long term (current) drug therapy: Secondary | ICD-10-CM

## 2021-04-13 DIAGNOSIS — G3189 Other specified degenerative diseases of nervous system: Secondary | ICD-10-CM

## 2021-04-13 DIAGNOSIS — L03115 Cellulitis of right lower limb: Principal | ICD-10-CM | POA: Diagnosis present

## 2021-04-13 DIAGNOSIS — E860 Dehydration: Secondary | ICD-10-CM | POA: Diagnosis present

## 2021-04-13 DIAGNOSIS — S069XAS Unspecified intracranial injury with loss of consciousness status unknown, sequela: Secondary | ICD-10-CM

## 2021-04-13 DIAGNOSIS — Z7901 Long term (current) use of anticoagulants: Secondary | ICD-10-CM

## 2021-04-13 DIAGNOSIS — E11621 Type 2 diabetes mellitus with foot ulcer: Secondary | ICD-10-CM | POA: Diagnosis not present

## 2021-04-13 DIAGNOSIS — L89613 Pressure ulcer of right heel, stage 3: Secondary | ICD-10-CM | POA: Diagnosis present

## 2021-04-13 DIAGNOSIS — N179 Acute kidney failure, unspecified: Secondary | ICD-10-CM | POA: Diagnosis not present

## 2021-04-13 DIAGNOSIS — I82409 Acute embolism and thrombosis of unspecified deep veins of unspecified lower extremity: Secondary | ICD-10-CM | POA: Diagnosis present

## 2021-04-13 DIAGNOSIS — Z86718 Personal history of other venous thrombosis and embolism: Secondary | ICD-10-CM

## 2021-04-13 DIAGNOSIS — M86371 Chronic multifocal osteomyelitis, right ankle and foot: Secondary | ICD-10-CM | POA: Diagnosis present

## 2021-04-13 DIAGNOSIS — E1129 Type 2 diabetes mellitus with other diabetic kidney complication: Secondary | ICD-10-CM | POA: Diagnosis present

## 2021-04-13 DIAGNOSIS — L97409 Non-pressure chronic ulcer of unspecified heel and midfoot with unspecified severity: Secondary | ICD-10-CM | POA: Diagnosis present

## 2021-04-13 DIAGNOSIS — Z20822 Contact with and (suspected) exposure to covid-19: Secondary | ICD-10-CM | POA: Diagnosis present

## 2021-04-13 DIAGNOSIS — D649 Anemia, unspecified: Secondary | ICD-10-CM | POA: Diagnosis present

## 2021-04-13 DIAGNOSIS — E1122 Type 2 diabetes mellitus with diabetic chronic kidney disease: Secondary | ICD-10-CM | POA: Diagnosis present

## 2021-04-13 DIAGNOSIS — D631 Anemia in chronic kidney disease: Secondary | ICD-10-CM | POA: Diagnosis present

## 2021-04-13 DIAGNOSIS — I10 Essential (primary) hypertension: Secondary | ICD-10-CM | POA: Diagnosis present

## 2021-04-13 DIAGNOSIS — L89154 Pressure ulcer of sacral region, stage 4: Secondary | ICD-10-CM | POA: Diagnosis present

## 2021-04-13 DIAGNOSIS — Z872 Personal history of diseases of the skin and subcutaneous tissue: Secondary | ICD-10-CM | POA: Diagnosis present

## 2021-04-13 LAB — CBC WITH DIFFERENTIAL/PLATELET
Abs Immature Granulocytes: 0.08 10*3/uL — ABNORMAL HIGH (ref 0.00–0.07)
Basophils Absolute: 0 10*3/uL (ref 0.0–0.1)
Basophils Relative: 0 %
Eosinophils Absolute: 0 10*3/uL (ref 0.0–0.5)
Eosinophils Relative: 0 %
HCT: 32.4 % — ABNORMAL LOW (ref 39.0–52.0)
Hemoglobin: 10.2 g/dL — ABNORMAL LOW (ref 13.0–17.0)
Immature Granulocytes: 1 %
Lymphocytes Relative: 4 %
Lymphs Abs: 0.6 10*3/uL — ABNORMAL LOW (ref 0.7–4.0)
MCH: 26.9 pg (ref 26.0–34.0)
MCHC: 31.5 g/dL (ref 30.0–36.0)
MCV: 85.5 fL (ref 80.0–100.0)
Monocytes Absolute: 0.5 10*3/uL (ref 0.1–1.0)
Monocytes Relative: 4 %
Neutro Abs: 13.1 10*3/uL — ABNORMAL HIGH (ref 1.7–7.7)
Neutrophils Relative %: 91 %
Platelets: 234 10*3/uL (ref 150–400)
RBC: 3.79 MIL/uL — ABNORMAL LOW (ref 4.22–5.81)
RDW: 17.8 % — ABNORMAL HIGH (ref 11.5–15.5)
WBC: 14.3 10*3/uL — ABNORMAL HIGH (ref 4.0–10.5)
nRBC: 0 % (ref 0.0–0.2)

## 2021-04-13 LAB — LACTIC ACID, PLASMA
Lactic Acid, Venous: 3.1 mmol/L (ref 0.5–1.9)
Lactic Acid, Venous: 3.2 mmol/L (ref 0.5–1.9)

## 2021-04-13 LAB — RESP PANEL BY RT-PCR (FLU A&B, COVID) ARPGX2
Influenza A by PCR: NEGATIVE
Influenza B by PCR: NEGATIVE
SARS Coronavirus 2 by RT PCR: NEGATIVE

## 2021-04-13 LAB — COMPREHENSIVE METABOLIC PANEL
ALT: 17 U/L (ref 0–44)
AST: 26 U/L (ref 15–41)
Albumin: 3.1 g/dL — ABNORMAL LOW (ref 3.5–5.0)
Alkaline Phosphatase: 92 U/L (ref 38–126)
Anion gap: 9 (ref 5–15)
BUN: 30 mg/dL — ABNORMAL HIGH (ref 8–23)
CO2: 23 mmol/L (ref 22–32)
Calcium: 8.4 mg/dL — ABNORMAL LOW (ref 8.9–10.3)
Chloride: 96 mmol/L — ABNORMAL LOW (ref 98–111)
Creatinine, Ser: 1.77 mg/dL — ABNORMAL HIGH (ref 0.61–1.24)
GFR, Estimated: 42 mL/min — ABNORMAL LOW (ref >60)
Glucose, Bld: 237 mg/dL — ABNORMAL HIGH (ref 70–99)
Potassium: 5 mmol/L (ref 3.5–5.1)
Sodium: 128 mmol/L — ABNORMAL LOW (ref 135–145)
Total Bilirubin: 0.6 mg/dL (ref 0.3–1.2)
Total Protein: 8.3 g/dL — ABNORMAL HIGH (ref 6.5–8.1)

## 2021-04-13 MED ORDER — VANCOMYCIN HCL IN DEXTROSE 1-5 GM/200ML-% IV SOLN
1000.0000 mg | Freq: Once | INTRAVENOUS | Status: AC
  Administered 2021-04-13: 1000 mg via INTRAVENOUS
  Filled 2021-04-13: qty 200

## 2021-04-13 MED ORDER — LACTATED RINGERS IV BOLUS
1000.0000 mL | Freq: Once | INTRAVENOUS | Status: AC
  Administered 2021-04-13: 1000 mL via INTRAVENOUS

## 2021-04-13 MED ORDER — SODIUM CHLORIDE 0.9 % IV SOLN
2.0000 g | Freq: Once | INTRAVENOUS | Status: AC
  Administered 2021-04-13: 2 g via INTRAVENOUS
  Filled 2021-04-13: qty 2

## 2021-04-13 NOTE — H&P (Signed)
History and Physical    [name] XBJ:[id number] DOB: 1956-04-20 DOA: 04/13/2021  PCP: Dione Booze, MD    Patient coming from:  Home.   Chief Complaint:  Right leg pain.    HPI: [name] is a 65 y.o. male seen in ed with complaints of right leg pain swelling drainage for past few months.Currently lives at home and wife has been managing wound care.  D/c from [facility] on the 13th of September.PT/OT and home health coming to home for wound dressing. Pt can walk with walker at baseline. Rt foot ulcer since: 2020 Rt foot Pain:no pain./ redness warmth started Thursday. / fever 101.6. Drainage: few months.  Wound clinic: started 2 years ago.  Podiatry: none Gen surgery:Dr.Marstson. Pt is uncomfortable because of iv is hurting him.  Pt was last admitted on 03/03/21 for UTI and sepsis,rt heel ulcer- stage 2, AKI, sacral ulcer.  Pt has past medical history of rt foot ulcer, sacral ulcer, osteomyelitis hyponatremia,anemia, acute kidney injury, history of DVT, cellulitis acute cystitis dysuria, history, DVT history,bipolar disorder, TBI,  Diabetes mellitus type 2.  ED Course:  Vitals:   04/13/21 2030 04/13/21 2032 04/13/21 2230  BP: 132/63  127/69  Pulse: 100  91  Resp: 19    Temp: 99.4 F (37.4 C)    TempSrc: Oral    SpO2: 95%  100%  Weight:  126.1 kg   Height:  5\' 6"  (1.676 m)   Emergency room patient is temperature 99.4, heart rate of 100, patient meets sepsis criteria with an elevated lactic and source of infection.  CMP shows sodium of 128 chloride 96 glucose 237 creatinine 1.7: Lab Results  Component Value Date   CREATININE 1.77 (H) 04/13/2021   CREATININE 1.51 (H) 03/03/2021   CREATININE 1.56 (H) 03/02/2021  LFTs within normal limits, lactic acid of 3.1 with repeat of 3.2, CBC shows a white count of 14.3 hemoglobin 10.2 platelets of 234.  Blood cultures collected in the ED, x-ray of foot shows Moderate degenerative changes of the 1st MTP  joint.Degenerative changes of the tibiotalar and subtalar joints.Mild diffuse soft tissue swelling.  Code sepsis activated in the emergency room and pharmacy consulted, patient given LR bolus 1 L and cefepime 2 g along with vancomycin.  Review of Systems:  Review of Systems  Constitutional:  Positive for fever.  All other systems reviewed and are negative.   Past Medical History:  Diagnosis Date   Bipolar disorder (HCC)    Diabetes mellitus without complication (HCC)    History of blood clots    Hypertension    TBI (traumatic brain injury)     Past Surgical History:  Procedure Laterality Date   BACK SURGERY     CARPAL TUNNEL RELEASE Bilateral    COLON SURGERY     COLOSTOMY     TONSILLECTOMY       reports that he has never smoked. He has never used smokeless tobacco. He reports current alcohol use. He reports that he does not use drugs.  No Known Allergies  Family History  Problem Relation Age of Onset   Depression Father    Deep vein thrombosis Father     Prior to Admission medications   Medication Sig Start Date End Date Taking? Authorizing Provider  amLODipine (NORVASC) 5 MG tablet Take 1 tablet (5 mg total) by mouth daily. 03/03/21 04/02/21  06/02/21, MD  ascorbic acid (VITAMIN C) 250 MG tablet Take by mouth. 07/25/20 07/25/21  [provider]  clotrimazole (  LOTRIMIN) 1 % cream Apply topically twice daily for 1 week. Patient not taking: No sig reported 08/09/20   Vaillancourt, Samantha, PA-C  ELIQUIS 5 MG TABS tablet Take 5 mg by mouth 2 (two) times daily.  11/17/18   [provider]  glipiZIDE (GLUCOTROL XL) 10 MG 24 hr tablet Take 10 mg by mouth daily with breakfast.  09/26/15   [provider]  memantine (NAMENDA) 10 MG tablet Take 10 mg by mouth 2 (two) times daily.    [provider]  Omega-3 1000 MG CAPS Take by mouth.    [provider]  Oxcarbazepine (TRILEPTAL) 300 MG tablet Take 1 tablet (300 mg total) by mouth 2  (two) times daily. 01/11/21   [name], MD  QUEtiapine (SEROQUEL) 100 MG tablet Take 1 tablet (100 mg total) by mouth at bedtime. 12/05/20   [name], MD  QUEtiapine (SEROQUEL) 50 MG tablet Take 1 tablet (50 mg total) by mouth 2 (two) times daily as needed. For severe anxiety and agitation Patient taking differently: Take 50 mg by mouth in the morning. For severe anxiety and agitation 05/03/20   [name], MD  rosuvastatin (CRESTOR) 20 MG tablet Take 20 mg by mouth daily.  08/29/17   [provider]  vitamin B-12 (CYANOCOBALAMIN) 1000 MCG tablet Take 3,000 mcg by mouth daily.    [provider]    Physical Exam: Vitals:   04/13/21 2030 04/13/21 2032 04/13/21 2230  BP: 132/63  127/69  Pulse: 100  91  Resp: 19    Temp: 99.4 F (37.4 C)    TempSrc: Oral    SpO2: 95%  100%  Weight:  126.1 kg   Height:  5\' 6"  (1.676 m)    Physical Exam Vitals and nursing note reviewed.  Constitutional:      General: He is not in acute distress.    Appearance: He is obese. He is not ill-appearing, toxic-appearing or diaphoretic.  HENT:     Head: Normocephalic and atraumatic.     Right Ear: External ear normal.     Left Ear: External ear normal.     Nose: Nose normal.     Mouth/Throat:     Mouth: Mucous membranes are moist.  Eyes:     Extraocular Movements: Extraocular movements intact.     Pupils: Pupils are equal, round, and reactive to light.  Neck:     Vascular: No carotid bruit.  Cardiovascular:     Rate and Rhythm: Normal rate and regular rhythm.     Pulses: Normal pulses.          Posterior tibial pulses are 2+ on the right side and 2+ on the left side.     Heart sounds: Normal heart sounds.  Pulmonary:     Effort: Pulmonary effort is normal.     Breath sounds: Normal breath sounds.  Abdominal:     General: Bowel sounds are normal. There is no distension.     Palpations: Abdomen is soft. There is no mass.     Tenderness: There is no abdominal  tenderness. There is no guarding.     Hernia: No hernia is present.  Musculoskeletal:     Right lower leg: Edema present.     Left lower leg: No edema.     Right foot: Swelling present.       Feet:  Feet:     Right foot:     Skin integrity: Ulcer, skin breakdown and erythema present.  Skin:  General: Skin is warm.     Findings: Erythema present.       Neurological:     General: No focal deficit present.     Mental Status: He is alert and oriented to person, place, and time.     Cranial Nerves: No cranial nerve deficit.     Motor: No weakness.  Psychiatric:        Mood and Affect: Mood normal.     Labs on Admission: I have personally reviewed following labs and imaging studies  No results for input(s): CKTOTAL, CKMB, TROPONINI in the last 72 hours. Lab Results  Component Value Date   WBC 14.3 (H) 04/13/2021   HGB 10.2 (L) 04/13/2021   HCT 32.4 (L) 04/13/2021   MCV 85.5 04/13/2021   PLT 234 04/13/2021    Recent Labs  Lab 04/13/21 2034  NA 128*  K 5.0  CL 96*  CO2 23  BUN 30*  CREATININE 1.77*  CALCIUM 8.4*  PROT 8.3*  BILITOT 0.6  ALKPHOS 92  ALT 17  AST 26  GLUCOSE 237*    COVID-19 Labs No results for input(s): DDIMER, FERRITIN, LDH, CRP in the last 72 hours.  Lab Results  Component Value Date   SARSCOV2NAA NEGATIVE 04/13/2021   SARSCOV2NAA NEGATIVE 03/02/2021   SARSCOV2NAA NEGATIVE 02/23/2021   SARSCOV2NAA NEGATIVE 03/18/2020    Radiological Exams on Admission: US Venous Img Lower Unilateral Right  Result Date: 04/13/2021 CLINICAL DATA:  Infection of the right leg. EXAM: RIGHT LOWER EXTREMITY VENOUS DOPPLER ULTRASOUND TECHNIQUE: Gray-scale sonography with compression, as well as color and duplex ultrasound, were performed to evaluate the deep venous system(s) from the level of the common femoral vein through the popliteal and proximal calf veins. COMPARISON:  March 18, 2020 FINDINGS: VENOUS Normal compressibility of the common femoral,  superficial femoral, and popliteal veins, as well as the visualized calf veins. Visualized portions of profunda femoral vein and great saphenous vein unremarkable. No filling defects to suggest DVT on grayscale or color Doppler imaging. Doppler waveforms show normal direction of venous flow, normal respiratory plasticity and response to augmentation. Limited views of the contralateral common femoral vein are unremarkable. OTHER The study is limited secondary to the patient's body habitus and diffuse soft tissue edema. Limitations: none IMPRESSION: Limited study without evidence of RIGHT lower extremity DVT. Electronically Signed   By: Aram Candela M.D.   On: 04/13/2021 23:20   DG Foot 2 Views Right  Result Date: 04/13/2021 CLINICAL DATA:  Fever, right leg pain EXAM: RIGHT FOOT - 2 VIEW COMPARISON:  None. FINDINGS: No fracture or dislocation is seen. Moderate degenerative changes of the 1st MTP joint. Degenerative changes of the tibiotalar and subtalar joints. Mild diffuse soft tissue swelling. IMPRESSION: Degenerative changes with mild diffuse soft tissue swelling. Electronically Signed   By: Charline Bills M.D.   On: 04/13/2021 23:08    EKG: Independently reviewed.  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 We will admit to med surg with tele monitoring.  Aspn,  Fall precaution. Vancomycin/ cefepime per pharmacy consult. MRI rt  foot. Wound care team. Gen surg or podiatry consult per AM team.  Monitoring  wbc count and lft's. Elevate both legs.  Optimize glucose control.   DM II: Ssi, glycemic protocol.  Hold glipizide to prevent any hypoglycemia.  AKI on CKD: Cont MIVF at low rate overnight.  Monitor  I's/ O's. Avoid contrast study and renally dose all needed meds.   Hyponatremia: Due to dehydration from sepsis, fever and decreased po intake.   Anemia: Since 2017, suspect multifactorial from CKD and possibly underlying IDA.  We will obtain stool guaiac/ type and screen/ IV ppi.   GAD: Cont seroquel and trileptal.  H/O rec DVT's: Pt to be continued on eliquis as pt has strong h/o blood clots in his dad and in himself.   HTN: Blood pressure 127/69, pulse 91, temperature 99.4 F (37.4 C), temperature source Oral, resp. rate 19, height 5\' 6"  (1.676 m), weight 126.1 kg, SpO2 100 %. We will continue pt on amlodipine.   Bipolar /Cognitive dysfunction/ GAD: Cont trileptal / Seroquel. Pt cannot give clear history because of his cog dysfunction and wife at bedside is telling his history.     DVT prophylaxis:  Eliquis   Code Status:  Full Code    Family Communication:  Bellizzi,[name] (Spouse)  [phone] (Home Phone)   Disposition Plan:  Home    Consults called:  None  Admission status: Inpatient     [phone] MD [facility] [phone] How to contact the [facility] Attending or Consulting provider 7A - 7P or covering provider during after hours 7P -7A, for this patient.    Check the care team in [facility] and look for a) attending/consulting [facility] provider listed and b) the [facility] team listed Log into [email or web address] and use [facility] universal password to access. If you do not have the password, please contact the hospital operator. Locate the [facility] provider you are looking for under [facility] and page to a number that you can be directly reached. If you still have difficulty reaching the provider, please page the [facility] (Director on Call) for the  Hospitalists listed on amion for assistance. [email or web address] Password [email or web address] 04/14/2021, 12:54 AM

## 2021-04-13 NOTE — ED Provider Notes (Signed)
[facility] Emergency Department Provider Note   ____________________________________________   Event Date/Time   First MD Initiated Contact with Patient 04/13/21 2203     (approximate)  I have reviewed the triage vital signs and the nursing notes.   HISTORY  Chief Complaint Wound Infection    HPI [name] is a 65 y.o. male with past medical history of TBI, neurogenic bladder with indwelling Foley, hypertension, hyperlipidemia, diabetes, and DVT on Eliquis who presents to the ED complaining of leg pain.  Patient reports that he has been dealing with increasing pain in his right leg for the past couple of months.  He has noticed some redness and swelling in the leg as well, states his wife has been managing a wound on his right heel.  He became more concerned today when he felt hot and states he had a fever.  He states his temperature never got above 99.5 and he has not taken any Tylenol or ibuprofen today.  He denies any recent trauma to his leg.  He denies any cough, chest pain, shortness of breath, abdominal pain, vomiting, or diarrhea.        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) 06/01/2020   Osteomyelitis of sacrum (HCC) 04/13/2020   Urinary retention 03/19/2020   History of DVT (deep vein thrombosis)  03/19/2020   Pressure injury of sacral region, stage 4 (HCC) 03/19/2020   Severe sepsis with septic shock (HCC) 03/18/2020   Obesity, Class III, BMI 40-49.9 (morbid obesity) (HCC) 03/18/2020   Cellulitis 03/18/2020   Acute cystitis without hematuria    Chronic kidney disease (CKD), stage III (moderate) (HCC) 01/20/2020   Pressure injury of skin 01/17/2020   DVT (deep venous thrombosis) (HCC) 01/16/2020   Cellulitis of sacral region 01/16/2020   HLD (hyperlipidemia) 01/16/2020   Bipolar disorder, in full remission, most recent episode mixed (HCC) 12/29/2019   High risk medication use 10/25/2019   Noncompliance with treatment plan 10/25/2019   Bipolar I disorder, most recent episode mixed (HCC) 01/07/2019   GAD (generalized anxiety disorder) 01/07/2019   Insomnia due to medical condition 01/07/2019   DVT, recurrent, lower extremity, acute (HCC) 03/06/2016   Bipolar disorder (HCC) 01/03/2016   Lithium toxicity 01/02/2016   Chronic anticoagulation 07/06/2014   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/06/2014   Hyperlipidemia 07/06/2014   Hypertension 07/06/2014   Leg swelling 07/06/2014   Obesity, Class II, BMI 35-39.9, with comorbidity 07/06/2014   On medication for venous thromboembolism 07/06/2014   Sleep apnea 07/06/2014   Type II diabetes mellitus with renal manifestations (HCC) 07/06/2014   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 Authorizing Provider  amLODipine (NORVASC) 5 MG tablet Take 1 tablet (5 mg total) by mouth daily. 03/03/21 04/02/21  Gillis Santa, MD  ascorbic acid (VITAMIN C) 250 MG tablet Take by mouth. 07/25/20 07/25/21  [provider]   clotrimazole (LOTRIMIN) 1 % cream Apply topically twice daily for 1 week. Patient not taking: No sig reported 08/09/20   Vaillancourt, Samantha, PA-C  ELIQUIS 5 MG TABS tablet Take 5 mg by mouth 2 (two) times daily.  11/17/18   [provider]  glipiZIDE (GLUCOTROL XL) 10 MG 24 hr tablet Take 10 mg by mouth daily with breakfast.  09/26/15   [provider]  memantine (NAMENDA) 10 MG tablet Take 10 mg by mouth 2 (two) times daily.    [provider]  Omega-3 1000 MG CAPS Take by mouth.    [provider]  Oxcarbazepine (TRILEPTAL) 300 MG tablet Take 1 tablet (300 mg total) by mouth 2 (two) times daily. 01/11/21   [name], MD  QUEtiapine (SEROQUEL) 100 MG tablet Take 1 tablet (100 mg total) by mouth at bedtime. 12/05/20   [name], MD  QUEtiapine (SEROQUEL) 50 MG tablet Take 1 tablet (50 mg total) by mouth 2 (two) times daily as needed. For severe anxiety and agitation Patient taking differently: Take 50 mg by mouth in the morning. For severe anxiety and agitation 05/03/20   [name], MD  rosuvastatin (CRESTOR) 20 MG tablet Take 20 mg by mouth daily.  08/29/17   [provider]  vitamin B-12 (CYANOCOBALAMIN) 1000 MCG tablet Take 3,000 mcg by mouth daily.    [provider]    Allergies Patient has no known allergies.  Family History  Problem Relation Age of Onset   Depression Father     Social History Social History   Tobacco Use   Smoking status: Never   Smokeless tobacco: Never  Vaping Use   Vaping Use: Never used  Substance Use Topics   Alcohol use: Yes   Drug use: No    Review of Systems  Constitutional: Positive for subjective fever/chills Eyes: No visual changes. ENT: No sore throat. Cardiovascular: Denies chest pain. Respiratory: Denies shortness of breath. Gastrointestinal: No abdominal pain.  No nausea, no vomiting.  No diarrhea.  No constipation. Genitourinary: Negative for  dysuria. Musculoskeletal: Negative for back pain.  Positive for right leg pain and swelling. Skin: Negative for rash.  Positive for heel wound. Neurological: Negative for headaches, focal weakness or numbness.  ____________________________________________   PHYSICAL EXAM:  VITAL SIGNS: ED Triage Vitals  Enc Vitals Group     BP 04/13/21 2030 132/63     Pulse Rate 04/13/21 2030 100     Resp 04/13/21 2030 19     Temp 04/13/21 2030 99.4 F (37.4 C)     Temp Source 04/13/21 2030 Oral     SpO2 04/13/21 2030 95 %     Weight 04/13/21 2032 278 lb (126.1 kg)     Height 04/13/21 2032 5\' 6"  (1.676 m)     Head Circumference --      Peak Flow --      Pain Score 04/13/21 2032 0     Pain Loc --      Pain Edu? --      Excl. in GC? --     Constitutional: Alert and oriented. Eyes: Conjunctivae are normal. Head: Atraumatic. Nose: No  congestion/rhinnorhea. Mouth/Throat: Mucous membranes are moist. Neck: Normal ROM Cardiovascular: Normal rate, regular rhythm. Grossly normal heart sounds. Respiratory: Normal respiratory effort.  No retractions. Lungs CTAB. Gastrointestinal: Soft and nontender. No distention. Genitourinary: Foley catheter draining clear yellow urine. Musculoskeletal: 1+ pitting edema to left lower extremity, 2+ pitting edema to right lower extremity with associated erythema, warmth, and diffuse tenderness to knee.  Ulceration and wound noted to right heel with purulent drainage. Neurologic:  Normal speech and language. No gross focal neurologic deficits are appreciated. Skin:  Skin is warm, dry and intact. No rash noted. Psychiatric: Mood and affect are normal. Speech and behavior are normal.  ____________________________________________   LABS (all labs ordered are listed, but only abnormal results are displayed)  Labs Reviewed  LACTIC ACID, PLASMA - Abnormal; Notable for the following components:      Result Value   Lactic Acid, Venous 3.1 (*)    All other components  within normal limits  LACTIC ACID, PLASMA - Abnormal; Notable for the following components:   Lactic Acid, Venous 3.2 (*)    All other components within normal limits  COMPREHENSIVE METABOLIC PANEL - Abnormal; Notable for the following components:   Sodium 128 (*)    Chloride 96 (*)    Glucose, Bld 237 (*)    BUN 30 (*)    Creatinine, Ser 1.77 (*)    Calcium 8.4 (*)    Total Protein 8.3 (*)    Albumin 3.1 (*)    GFR, Estimated 42 (*)    All other components within normal limits  CBC WITH DIFFERENTIAL/PLATELET - Abnormal; Notable for the following components:   WBC 14.3 (*)    RBC 3.79 (*)    Hemoglobin 10.2 (*)    HCT 32.4 (*)    RDW 17.8 (*)    Neutro Abs 13.1 (*)    Lymphs Abs 0.6 (*)    Abs Immature Granulocytes 0.08 (*)    All other components within normal limits  CULTURE, BLOOD (ROUTINE X 2)  CULTURE, BLOOD (ROUTINE X 2)  RESP PANEL BY RT-PCR (FLU A&B, COVID) ARPGX2    PROCEDURES  Procedure(s) performed (including Critical Care):  .Critical Care Performed by: Chesley Noon, MD Authorized by: Chesley Noon, MD   Critical care provider statement:    Critical care time was exclusive of:  Separately billable procedures and treating other patients and teaching time   Critical care was necessary to treat or prevent imminent or life-threatening deterioration of the following conditions:  Sepsis   Critical care was time spent personally by me on the following activities:  Development of treatment plan with patient or surrogate, evaluation of patient's response to treatment, examination of patient, obtaining history from patient or surrogate, ordering and performing treatments and interventions, ordering and review of laboratory studies, ordering and review of radiographic studies, pulse oximetry, re-evaluation of patient's condition and review of old charts   I assumed direction of critical care for this patient from another provider in my specialty: no     Care discussed  with: admitting provider     ____________________________________________   INITIAL IMPRESSION / ASSESSMENT AND PLAN / ED COURSE      65 year old male with past medical history of hypertension, hyperlipidemia, diabetes, DVT on Eliquis, TBI, and neurogenic bladder with indwelling Foley catheter who presents to the ED complaining of increased pain and swelling to his right leg over the past couple of months, was concerned he had developed fever earlier today.  Patient is afebrile here  in the ED, but does appear to have extensive cellulitis tracking up his right leg to his knee with wound at his right heel concerning for osteomyelitis.  We will further assess with x-ray, also check for DVT with ultrasound.  Patient meets sepsis criteria based off heart rate and leukocytosis, we will start broad-spectrum antibiotics to cover for osteomyelitis and hydrate with IV fluids.  Right foot x-ray reviewed by me and shows no obvious signs of osteomyelitis, ultrasound is limited but negative for DVT.  Lactic acid remains elevated and we will continue to trend with additional IV fluids.  Case discussed with hospitalist for admission due to concern for sepsis secondary to right lower extremity cellulitis.      ____________________________________________   FINAL CLINICAL IMPRESSION(S) / ED DIAGNOSES  Final diagnoses:  Cellulitis of right lower extremity  Sepsis without acute organ dysfunction, due to unspecified organism [facility])     ED Discharge Orders     None        Note:  This document was prepared using Dragon voice recognition software and may include unintentional dictation errors.    Chesley Noon, MD 04/13/21 [phone]

## 2021-04-13 NOTE — Progress Notes (Signed)
Sepsis tracking by eLINK 

## 2021-04-13 NOTE — ED Triage Notes (Signed)
Pt to ED via [facility] for a leg infection, he at [facility] [employer], his wife felt they were not helping so she took him out, and the infection in his leg has gotten worse. CBG 261, 117/59, HR 120, 100% on room air.

## 2021-04-13 NOTE — ED Triage Notes (Signed)
Pt in via [facility] from home, patient states he has had a fever today and does not know why.  Pt also complains of right leg pain, patient with dressed wound to right heel, drainage noted to site w/ significant swelling of foot, warm to touch and redness up into right calf.  Pt A/Ox4, vitals WDL, NAD noted at this time.

## 2021-04-13 NOTE — ED Notes (Signed)
Per pt request calling spouse.

## 2021-04-14 ENCOUNTER — Inpatient Hospital Stay: Payer: Medicare Other

## 2021-04-14 ENCOUNTER — Encounter: Payer: Self-pay | Admitting: Internal Medicine

## 2021-04-14 DIAGNOSIS — A419 Sepsis, unspecified organism: Secondary | ICD-10-CM | POA: Diagnosis present

## 2021-04-14 DIAGNOSIS — L89613 Pressure ulcer of right heel, stage 3: Secondary | ICD-10-CM | POA: Diagnosis present

## 2021-04-14 DIAGNOSIS — F3170 Bipolar disorder, currently in remission, most recent episode unspecified: Secondary | ICD-10-CM | POA: Diagnosis not present

## 2021-04-14 DIAGNOSIS — L97412 Non-pressure chronic ulcer of right heel and midfoot with fat layer exposed: Secondary | ICD-10-CM | POA: Diagnosis present

## 2021-04-14 DIAGNOSIS — N179 Acute kidney failure, unspecified: Secondary | ICD-10-CM | POA: Diagnosis present

## 2021-04-14 DIAGNOSIS — N189 Chronic kidney disease, unspecified: Secondary | ICD-10-CM | POA: Diagnosis present

## 2021-04-14 DIAGNOSIS — E11621 Type 2 diabetes mellitus with foot ulcer: Secondary | ICD-10-CM | POA: Diagnosis present

## 2021-04-14 DIAGNOSIS — L03115 Cellulitis of right lower limb: Secondary | ICD-10-CM | POA: Diagnosis present

## 2021-04-14 DIAGNOSIS — Z86718 Personal history of other venous thrombosis and embolism: Secondary | ICD-10-CM | POA: Diagnosis not present

## 2021-04-14 DIAGNOSIS — I129 Hypertensive chronic kidney disease with stage 1 through stage 4 chronic kidney disease, or unspecified chronic kidney disease: Secondary | ICD-10-CM | POA: Diagnosis present

## 2021-04-14 DIAGNOSIS — E871 Hypo-osmolality and hyponatremia: Secondary | ICD-10-CM

## 2021-04-14 DIAGNOSIS — L97519 Non-pressure chronic ulcer of other part of right foot with unspecified severity: Secondary | ICD-10-CM | POA: Diagnosis present

## 2021-04-14 DIAGNOSIS — Z7901 Long term (current) use of anticoagulants: Secondary | ICD-10-CM | POA: Diagnosis not present

## 2021-04-14 DIAGNOSIS — E1122 Type 2 diabetes mellitus with diabetic chronic kidney disease: Secondary | ICD-10-CM | POA: Diagnosis present

## 2021-04-14 DIAGNOSIS — E860 Dehydration: Secondary | ICD-10-CM | POA: Diagnosis present

## 2021-04-14 DIAGNOSIS — F411 Generalized anxiety disorder: Secondary | ICD-10-CM | POA: Diagnosis present

## 2021-04-14 DIAGNOSIS — Z23 Encounter for immunization: Secondary | ICD-10-CM | POA: Diagnosis present

## 2021-04-14 DIAGNOSIS — Z7984 Long term (current) use of oral hypoglycemic drugs: Secondary | ICD-10-CM | POA: Diagnosis not present

## 2021-04-14 DIAGNOSIS — F319 Bipolar disorder, unspecified: Secondary | ICD-10-CM | POA: Diagnosis present

## 2021-04-14 DIAGNOSIS — L97509 Non-pressure chronic ulcer of other part of unspecified foot with unspecified severity: Secondary | ICD-10-CM | POA: Diagnosis not present

## 2021-04-14 DIAGNOSIS — E1129 Type 2 diabetes mellitus with other diabetic kidney complication: Secondary | ICD-10-CM

## 2021-04-14 DIAGNOSIS — E785 Hyperlipidemia, unspecified: Secondary | ICD-10-CM | POA: Diagnosis present

## 2021-04-14 DIAGNOSIS — D631 Anemia in chronic kidney disease: Secondary | ICD-10-CM | POA: Diagnosis present

## 2021-04-14 DIAGNOSIS — Z6841 Body Mass Index (BMI) 40.0 and over, adult: Secondary | ICD-10-CM | POA: Diagnosis not present

## 2021-04-14 DIAGNOSIS — L97419 Non-pressure chronic ulcer of right heel and midfoot with unspecified severity: Secondary | ICD-10-CM

## 2021-04-14 DIAGNOSIS — Z20822 Contact with and (suspected) exposure to covid-19: Secondary | ICD-10-CM | POA: Diagnosis present

## 2021-04-14 DIAGNOSIS — L89154 Pressure ulcer of sacral region, stage 4: Secondary | ICD-10-CM | POA: Diagnosis present

## 2021-04-14 DIAGNOSIS — Z79899 Other long term (current) drug therapy: Secondary | ICD-10-CM | POA: Diagnosis not present

## 2021-04-14 DIAGNOSIS — Z872 Personal history of diseases of the skin and subcutaneous tissue: Secondary | ICD-10-CM | POA: Diagnosis present

## 2021-04-14 LAB — CBC
HCT: 28.9 % — ABNORMAL LOW (ref 39.0–52.0)
Hemoglobin: 9.5 g/dL — ABNORMAL LOW (ref 13.0–17.0)
MCH: 28 pg (ref 26.0–34.0)
MCHC: 32.9 g/dL (ref 30.0–36.0)
MCV: 85.3 fL (ref 80.0–100.0)
Platelets: 215 10*3/uL (ref 150–400)
RBC: 3.39 MIL/uL — ABNORMAL LOW (ref 4.22–5.81)
RDW: 17.7 % — ABNORMAL HIGH (ref 11.5–15.5)
WBC: 16.3 10*3/uL — ABNORMAL HIGH (ref 4.0–10.5)
nRBC: 0 % (ref 0.0–0.2)

## 2021-04-14 LAB — GLUCOSE, CAPILLARY
Glucose-Capillary: 135 mg/dL — ABNORMAL HIGH (ref 70–99)
Glucose-Capillary: 130 mg/dL — ABNORMAL HIGH (ref 70–99)
Glucose-Capillary: 116 mg/dL — ABNORMAL HIGH (ref 70–99)
Glucose-Capillary: 84 mg/dL (ref 70–99)
Glucose-Capillary: 112 mg/dL — ABNORMAL HIGH (ref 70–99)

## 2021-04-14 LAB — RETICULOCYTES
Immature Retic Fract: 29 % — ABNORMAL HIGH (ref 2.3–15.9)
RBC.: 3.37 MIL/uL — ABNORMAL LOW (ref 4.22–5.81)
Retic Count, Absolute: 63 10*3/uL (ref 19.0–186.0)
Retic Ct Pct: 1.9 % (ref 0.4–3.1)

## 2021-04-14 LAB — IRON AND TIBC
Iron: 12 ug/dL — ABNORMAL LOW (ref 45–182)
Saturation Ratios: 5 % — ABNORMAL LOW (ref 17.9–39.5)
TIBC: 251 ug/dL (ref 250–450)
UIBC: 239 ug/dL

## 2021-04-14 LAB — COMPREHENSIVE METABOLIC PANEL
ALT: 15 U/L (ref 0–44)
AST: 19 U/L (ref 15–41)
Albumin: 2.9 g/dL — ABNORMAL LOW (ref 3.5–5.0)
Alkaline Phosphatase: 80 U/L (ref 38–126)
Anion gap: 9 (ref 5–15)
BUN: 30 mg/dL — ABNORMAL HIGH (ref 8–23)
CO2: 24 mmol/L (ref 22–32)
Calcium: 8.3 mg/dL — ABNORMAL LOW (ref 8.9–10.3)
Chloride: 96 mmol/L — ABNORMAL LOW (ref 98–111)
Creatinine, Ser: 1.73 mg/dL — ABNORMAL HIGH (ref 0.61–1.24)
GFR, Estimated: 43 mL/min — ABNORMAL LOW (ref >60)
Glucose, Bld: 157 mg/dL — ABNORMAL HIGH (ref 70–99)
Potassium: 4.5 mmol/L (ref 3.5–5.1)
Sodium: 129 mmol/L — ABNORMAL LOW (ref 135–145)
Total Bilirubin: 0.8 mg/dL (ref 0.3–1.2)
Total Protein: 8.2 g/dL — ABNORMAL HIGH (ref 6.5–8.1)

## 2021-04-14 LAB — T4, FREE: Free T4: 0.82 ng/dL (ref 0.61–1.12)

## 2021-04-14 LAB — TYPE AND SCREEN
ABO/RH(D): A POS
Antibody Screen: NEGATIVE

## 2021-04-14 LAB — FOLATE: Folate: 82 ng/mL (ref >5.9)

## 2021-04-14 LAB — VITAMIN B12: Vitamin B-12: 1049 pg/mL — ABNORMAL HIGH (ref 180–914)

## 2021-04-14 LAB — LIPID PANEL
Cholesterol: 141 mg/dL (ref 0–200)
HDL: 44 mg/dL (ref >40)
LDL Cholesterol: 88 mg/dL (ref 0–99)
Total CHOL/HDL Ratio: 3.2 RATIO
Triglycerides: 45 mg/dL (ref <150)
VLDL: 9 mg/dL (ref 0–40)

## 2021-04-14 LAB — TSH: TSH: 2.175 u[IU]/mL (ref 0.350–4.500)

## 2021-04-14 LAB — C-REACTIVE PROTEIN: CRP: 11.3 mg/dL — ABNORMAL HIGH (ref <1.0)

## 2021-04-14 LAB — FERRITIN: Ferritin: 46 ng/mL (ref 24–336)

## 2021-04-14 LAB — MAGNESIUM: Magnesium: 1.7 mg/dL (ref 1.7–2.4)

## 2021-04-14 MED ORDER — INFLUENZA VAC A&B SA ADJ QUAD 0.5 ML IM PRSY
0.5000 mL | PREFILLED_SYRINGE | INTRAMUSCULAR | Status: AC
  Administered 2021-04-16: 0.5 mL via INTRAMUSCULAR
  Filled 2021-04-14: qty 0.5

## 2021-04-14 MED ORDER — VANCOMYCIN HCL 2000 MG/400ML IV SOLN: 2000.0000 mg | INTRAVENOUS | Status: DC

## 2021-04-14 MED ORDER — APIXABAN 5 MG PO TABS
5.0000 mg | ORAL_TABLET | Freq: Two times a day (BID) | ORAL | Status: DC
  Administered 2021-04-14 – 2021-04-16 (×5): 5 mg via ORAL
  Filled 2021-04-14 (×5): qty 1

## 2021-04-14 MED ORDER — SODIUM CHLORIDE 0.9 % IV SOLN: INTRAVENOUS | Status: AC

## 2021-04-14 MED ORDER — OXCARBAZEPINE 300 MG PO TABS
300.0000 mg | ORAL_TABLET | Freq: Two times a day (BID) | ORAL | Status: DC
  Administered 2021-04-14 – 2021-04-16 (×5): 300 mg via ORAL
  Filled 2021-04-14 (×6): qty 1

## 2021-04-14 MED ORDER — QUETIAPINE FUMARATE 25 MG PO TABS
50.0000 mg | ORAL_TABLET | Freq: Every morning | ORAL | Status: DC
  Administered 2021-04-15 – 2021-04-16 (×2): 50 mg via ORAL
  Filled 2021-04-14 (×2): qty 2

## 2021-04-14 MED ORDER — ACETAMINOPHEN 325 MG PO TABS
650.0000 mg | ORAL_TABLET | Freq: Four times a day (QID) | ORAL | Status: DC | PRN
  Administered 2021-04-14: 650 mg via ORAL
  Filled 2021-04-14: qty 2

## 2021-04-14 MED ORDER — VANCOMYCIN HCL IN DEXTROSE 1-5 GM/200ML-% IV SOLN
1000.0000 mg | INTRAVENOUS | Status: DC
  Administered 2021-04-14 – 2021-04-15 (×2): 1000 mg via INTRAVENOUS
  Filled 2021-04-14 (×3): qty 200

## 2021-04-14 MED ORDER — ROSUVASTATIN CALCIUM 20 MG PO TABS
20.0000 mg | ORAL_TABLET | Freq: Every day | ORAL | Status: DC
  Administered 2021-04-14 – 2021-04-16 (×3): 20 mg via ORAL
  Filled 2021-04-14: qty 1
  Filled 2021-04-14: qty 2
  Filled 2021-04-14: qty 1
  Filled 2021-04-14 (×2): qty 2
  Filled 2021-04-14: qty 1

## 2021-04-14 MED ORDER — VANCOMYCIN HCL 2000 MG/400ML IV SOLN: 2000.0000 mg | Freq: Two times a day (BID) | INTRAVENOUS | Status: DC

## 2021-04-14 MED ORDER — CHLORHEXIDINE GLUCONATE CLOTH 2 % EX PADS
6.0000 | MEDICATED_PAD | Freq: Every day | CUTANEOUS | Status: DC
  Administered 2021-04-14 – 2021-04-16 (×2): 6 via TOPICAL

## 2021-04-14 MED ORDER — INSULIN ASPART 100 UNIT/ML IJ SOLN
0.0000 [IU] | Freq: Three times a day (TID) | INTRAMUSCULAR | Status: DC
  Administered 2021-04-14 – 2021-04-16 (×2): 1 [IU] via SUBCUTANEOUS
  Filled 2021-04-14 (×2): qty 1

## 2021-04-14 MED ORDER — MEMANTINE HCL 5 MG PO TABS
10.0000 mg | ORAL_TABLET | Freq: Two times a day (BID) | ORAL | Status: DC
  Administered 2021-04-14 – 2021-04-16 (×6): 10 mg via ORAL
  Filled 2021-04-14 (×6): qty 2

## 2021-04-14 MED ORDER — ACETAMINOPHEN 325 MG PO TABS
ORAL_TABLET | ORAL | Status: AC
  Filled 2021-04-14: qty 2

## 2021-04-14 MED ORDER — VANCOMYCIN HCL 1500 MG/300ML IV SOLN
1500.0000 mg | Freq: Once | INTRAVENOUS | Status: AC
  Administered 2021-04-14: 1500 mg via INTRAVENOUS
  Filled 2021-04-14 (×2): qty 300

## 2021-04-14 MED ORDER — PANTOPRAZOLE SODIUM 40 MG IV SOLR
40.0000 mg | Freq: Two times a day (BID) | INTRAVENOUS | Status: DC
  Administered 2021-04-14 – 2021-04-16 (×6): 40 mg via INTRAVENOUS
  Filled 2021-04-14 (×6): qty 40

## 2021-04-14 MED ORDER — AMLODIPINE BESYLATE 5 MG PO TABS
5.0000 mg | ORAL_TABLET | Freq: Every day | ORAL | Status: DC
  Administered 2021-04-14 – 2021-04-16 (×3): 5 mg via ORAL
  Filled 2021-04-14 (×3): qty 1

## 2021-04-14 MED ORDER — ACETAMINOPHEN 325 MG RE SUPP
650.0000 mg | Freq: Once | RECTAL | Status: AC
  Administered 2021-04-14: 650 mg via RECTAL
  Filled 2021-04-14: qty 2

## 2021-04-14 MED ORDER — QUETIAPINE FUMARATE 25 MG PO TABS
100.0000 mg | ORAL_TABLET | Freq: Every day | ORAL | Status: DC
  Administered 2021-04-14 – 2021-04-15 (×3): 100 mg via ORAL
  Filled 2021-04-14 (×3): qty 4

## 2021-04-14 MED ORDER — SODIUM CHLORIDE 0.9 % IV SOLN
2.0000 g | Freq: Two times a day (BID) | INTRAVENOUS | Status: DC
  Administered 2021-04-14 – 2021-04-16 (×5): 2 g via INTRAVENOUS
  Filled 2021-04-14 (×7): qty 2

## 2021-04-14 NOTE — Progress Notes (Signed)
Pharmacy Antibiotic Note  [name] is a 65 y.o. male admitted on 04/13/2021 with  wound infection .  Pharmacy has been consulted for Vancomycin dosing.  Plan: Vancomycin 1 gm IV X 1 given in ED on 10/14 @ 2304. Additional Vanc 1500 mg IV X 1 ordered to make total loading dose of 2500 mg.  Vancomycin 2 gm IV Q48H ordered to start on 10/16 @ 2300.  AUC = 446 Vanc trough = 7.5   Height: 5\' 6"  (167.6 cm) Weight: 126.1 kg (278 lb) IBW/kg (Calculated) : 63.8  Temp (24hrs), Avg:100.4 F (38 C), Min:99.4 F (37.4 C), Max:101.4 F (38.6 C)  Recent Labs  Lab 04/13/21 2034 04/13/21 2035 04/13/21 2250  WBC 14.3*  --   --   CREATININE 1.77*  --   --   LATICACIDVEN  --  3.1* 3.2*     Estimated Creatinine Clearance: 52.2 mL/min (A) (by C-G formula based on SCr of 1.77 mg/dL (H)).    No Known Allergies  Antimicrobials this admission:   >>    >>   Dose adjustments this admission:   Microbiology results:  BCx:   UCx:    Sputum:    MRSA PCR:   Thank you for allowing pharmacy to be a part of this patient's care.  Jelani Vreeland D 04/14/2021 1:55 AM

## 2021-04-14 NOTE — Progress Notes (Signed)
PROGRESS NOTE    [name]  QQP:[id number] DOB: 1956-03-26 DOA: 04/13/2021 PCP: Dione Booze, MD   Brief Narrative:  [name] is a 65 y.o. male seen in ed with complaints of right leg pain swelling drainage for past few months.Currently lives at home and wife has been managing wound care.  D/c from [facility] on the 13th of September.PT/OT and home health coming to home for wound dressing. Pt can walk with walker at baseline. Rt foot ulcer since: 2020 Rt foot Pain:no pain./ redness warmth started Thursday. / fever 101.6. Drainage: few months.  Wound clinic: started 2 years ago.  Podiatry: none Gen surgery:Dr.Marstson. Pt is uncomfortable because of iv is hurting him.  Pt was last admitted on 03/03/21 for UTI and sepsis,rt heel ulcer- stage 2, AKI, sacral ulcer.   Pt has past medical history of rt foot ulcer, sacral ulcer, osteomyelitis hyponatremia,anemia, acute kidney injury, history of DVT, cellulitis acute cystitis dysuria, history, DVT history,bipolar disorder, TBI,  Diabetes mellitus type 2. ED Course:       Vitals:    04/13/21 2030 04/13/21 2032 04/13/21 2230  BP: 132/63   127/69  Pulse: 100   91  Resp: 19      Temp: 99.4 F (37.4 C)      TempSrc: Oral      SpO2: 95%   100%  Weight:   126.1 kg    Height:   5\' 6"  (1.676 m)    Emergency room patient is temperature 99.4, heart rate of 100, patient meets sepsis criteria with an elevated lactic and source of infection.  CMP shows sodium of 128 chloride 96 glucose 237 creatinine 1.7: Recent Labs       Lab Results  Component Value Date    CREATININE 1.77 (H) 04/13/2021    CREATININE 1.51 (H) 03/03/2021    CREATININE 1.56 (H) 03/02/2021    LFTs within normal limits, lactic acid of 3.1 with repeat of 3.2, CBC shows a white count of 14.3 hemoglobin 10.2 platelets of 234.  Blood cultures collected in the ED, x-ray of foot shows Moderate degenerative changes of the 1st MTP joint.Degenerative changes of  the tibiotalar and subtalar joints.Mild diffuse soft tissue swelling.  Code sepsis activated in the emergency room and pharmacy consulted, patient given LR bolus 1 L and cefepime 2 g along with vancomycin.  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 We will admit to med surg with tele monitoring.  Aspn, Fall precaution. Vancomycin/ cefepime per pharmacy consult. MRI rt  foot. Wound care team. Gen surg or podiatry consult per AM team.  Monitoring wbc count and lft's. Elevate both legs.  Optimize glucose control.    DM II: Ssi, glycemic protocol.  Hold glipizide to prevent any hypoglycemia.   AKI on CKD: Cont MIVF at low rate overnight.  Monitor  I's/ O's. Avoid contrast study and renally dose all needed meds.    Hyponatremia: Due to dehydration from sepsis, fever and decreased po intake.    Anemia:  Since 2017, suspect multifactorial from CKD and possibly underlying IDA.  We will obtain stool guaiac/ type and screen/ IV ppi.    GAD: Cont seroquel and trileptal.   H/O rec DVT's: Pt to be continued on eliquis as pt has strong h/o blood clots in his dad and in himself.    HTN: Blood pressure 127/69, pulse 91, temperature 99.4 F (37.4 C), temperature source Oral, resp. rate 19, height 5\' 6"  (1.676 m), weight 126.1 kg, SpO2 100 %. We will continue pt on amlodipine.    Bipolar /Cognitive dysfunction/ GAD: Cont trileptal / Seroquel. Pt cannot give clear history because of his cog dysfunction and  wife at bedside is telling his history.     DVT prophylaxis: [place]  Code Status: full Code Status History     Date Active Date Inactive Code Status Order ID Comments User Context   02/23/2021 1651 03/03/2021 2300 Full Code 05/03/2021  [id number], MD ED   03/18/2020 1553 03/22/2020 2055 Full Code 2056  [id number], MD ED   01/16/2020 1840 01/19/2020 1945 Full Code 01/21/2020  [id number], MD Inpatient   01/02/2016 2050 01/04/2016 2147 Full Code 2148  [id number], MD Inpatient      Questions for Most Recent Historical Code Status (Order Gracelyn Nurse)       Family Communication: none at bedside discussed with patient in detail Disposition Plan:    Patient not medically stable for discharge, will require subspecialty consultation and possible OR debridement as well as IV antibiotics. Consults called:  Podiatry Admission status: Inpatient   Consultants:  As above  Procedures:  MR ANKLE RIGHT WO CONTRAST  Result Date: 04/14/2021 CLINICAL DATA:  Diabetic heel wound with redness and swelling EXAM: MRI OF THE RIGHT ANKLE WITHOUT CONTRAST TECHNIQUE: Multiplanar, multisequence MR imaging of the ankle was performed. No intravenous contrast was administered. COMPARISON:  X-ray 04/13/2021 FINDINGS: TENDONS Peroneal: Intact peroneus longus and peroneus brevis tendons. Posteromedial: Distal tibialis posterior tendinosis and mild tenosynovitis. Flexor hallucis longus and flexor digitorum longus tendons intact. Anterior: Intact tibialis anterior, extensor hallucis longus and extensor digitorum longus tendons. Achilles: Distal Achilles tendinosis with low-grade interstitial tear of the central tendon distally (series 4, image 12). No full-thickness or retracted tear. No retrocalcaneal bursitis. Plantar Fascia: Intact. LIGAMENTS Lateral: The anterior talofibular, posterior talofibular, and calcaneofibular ligaments are thickened and heterogeneous, likely reflecting sequela of prior  trauma. The inferior tibiofibular ligaments appear intact. Medial: Deltoid ligament and spring ligament complex appear grossly intact. CARTILAGE Ankle Joint: Diffuse chondral thinning.  Trace joint effusion. Subtalar Joints/Sinus Tarsi: Mild subtalar osteoarthritis. Trace posterior subtalar joint effusion. Preservation of the anatomic fat within the sinus tarsi. Bones: Mild subcortical bone marrow edema at the plantar-medial aspect of the posterior calcaneus (series 4, images 23-24). No confluent low T1 signal changes are evident. No focal erosion. No acute fracture. No dislocation. Degenerative changes within the midfoot most pronounced at the talonavicular joint with prominent dorsal fragmented osteophyte. Other: Soft tissue ulceration at the posteromedial aspect of the heel. Ulcer base does not extend to the underlying calcaneal cortex. There is surrounding soft tissue edema. No organized or drainable fluid collections. IMPRESSION: 1. Soft tissue ulceration at the posteromedial aspect of the heel with surrounding soft tissue edema. Ulcer base does not extend to the underlying calcaneal cortex. Mild subcortical bone marrow edema at the plantar-medial aspect of the posterior calcaneus, likely representing reactive osteitis. No erosion or confluent low T1 signal changes to suggest acute osteomyelitis at this time.  2. Distal tibialis posterior tendinosis and mild tenosynovitis. 3. Distal Achilles tendinosis with low-grade interstitial tear of the central tendon distally. 4. Sequela of prior lateral ankle ligament injury. Electronically Signed   By: Duanne Guess D.O.   On: 04/14/2021 11:21   US Venous Img Lower Unilateral Right  Result Date: 04/13/2021 CLINICAL DATA:  Infection of the right leg. EXAM: RIGHT LOWER EXTREMITY VENOUS DOPPLER ULTRASOUND TECHNIQUE: Gray-scale sonography with compression, as well as color and duplex ultrasound, were performed to evaluate the deep venous system(s) from the level of  the common femoral vein through the popliteal and proximal calf veins. COMPARISON:  March 18, 2020 FINDINGS: VENOUS Normal compressibility of the common femoral, superficial femoral, and popliteal veins, as well as the visualized calf veins. Visualized portions of profunda femoral vein and great saphenous vein unremarkable. No filling defects to suggest DVT on grayscale or color Doppler imaging. Doppler waveforms show normal direction of venous flow, normal respiratory plasticity and response to augmentation. Limited views of the contralateral common femoral vein are unremarkable. OTHER The study is limited secondary to the patient's body habitus and diffuse soft tissue edema. Limitations: none IMPRESSION: Limited study without evidence of RIGHT lower extremity DVT. Electronically Signed   By: Aram Candela M.D.   On: 04/13/2021 23:20   DG Foot 2 Views Right  Result Date: 04/13/2021 CLINICAL DATA:  Fever, right leg pain EXAM: RIGHT FOOT - 2 VIEW COMPARISON:  None. FINDINGS: No fracture or dislocation is seen. Moderate degenerative changes of the 1st MTP joint. Degenerative changes of the tibiotalar and subtalar joints. Mild diffuse soft tissue swelling. IMPRESSION: Degenerative changes with mild diffuse soft tissue swelling. Electronically Signed   By: Charline Bills M.D.   On: 04/13/2021 23:08      Subjective: Patient admitted overnight for right heel infection consistent with diabetic foot wound No acute events overnight Did report prior wound care service as an outpatient for approximately 2 years  Objective: Vitals:   04/14/21 0100 04/14/21 0236 04/14/21 0314 04/14/21 0719  BP: (!) 141/67  (!) 143/70 134/71  Pulse: 95  92 87  Resp: (!) 22  (!) 22 14  Temp: (!) 101.4 F (38.6 C) 98.1 F (36.7 C) 98.5 F (36.9 C) 98 F (36.7 C)  TempSrc: Oral Oral    SpO2:   100% 99%  Weight:      Height:        Intake/Output Summary (Last 24 hours) at 04/14/2021 1225 Last data filed at  04/14/2021 1019 Gross per 24 hour  Intake 299.02 ml  Output --  Net 299.02 ml   Filed Weights   04/13/21 2032  Weight: 126.1 kg    Examination:  General exam: Appears calm and comfortable  Respiratory system: Clear to auscultation. Respiratory effort normal. Cardiovascular system: S1 & S2 heard, RRR. No JVD, murmurs, rubs, gallops or clicks. No pedal edema. Gastrointestinal system: Abdomen is nondistended, soft and nontender. No organomegaly or masses felt. Normal bowel sounds heard. Central nervous system: Alert and oriented. No focal neurological deficits. Extremities: Symmetric 5 x 5 power. Skin: Malodorous ulceration with skin breakdown erythema right heel Psychiatry: Judgement and insight appear normal. Mood & affect appropriate.     Data Reviewed: I have personally reviewed following labs and imaging studies  CBC: Recent Labs  Lab 04/13/21 2034 04/14/21 0334  WBC 14.3* 16.3*  NEUTROABS 13.1*  --   HGB 10.2* 9.5*  HCT 32.4* 28.9*  MCV 85.5 85.3  PLT 234 215   Basic  Metabolic Panel: Recent Labs  Lab 04/13/21 2034 04/14/21 0334  NA 128* 129*  K 5.0 4.5  CL 96* 96*  CO2 23 24  GLUCOSE 237* 157*  BUN 30* 30*  CREATININE 1.77* 1.73*  CALCIUM 8.4* 8.3*  MG  --  1.7   GFR: Estimated Creatinine Clearance: 53.4 mL/min (A) (by C-G formula based on SCr of 1.73 mg/dL (H)). Liver Function Tests: Recent Labs  Lab 04/13/21 2034 04/14/21 0334  AST 26 19  ALT 17 15  ALKPHOS 92 80  BILITOT 0.6 0.8  PROT 8.3* 8.2*  ALBUMIN 3.1* 2.9*   No results for input(s): LIPASE, AMYLASE in the last 168 hours. No results for input(s): AMMONIA in the last 168 hours. Coagulation Profile: No results for input(s): INR, PROTIME in the last 168 hours. Cardiac Enzymes: No results for input(s): CKTOTAL, CKMB, CKMBINDEX, TROPONINI in the last 168 hours. BNP (last 3 results) No results for input(s): PROBNP in the last 8760 hours. HbA1C: No results for input(s): HGBA1C in the  last 72 hours. CBG: Recent Labs  Lab 04/14/21 0322 04/14/21 0720 04/14/21 1146  GLUCAP 135* 130* 116*   Lipid Profile: Recent Labs    04/14/21 0334  CHOL 141  HDL 44  LDLCALC 88  TRIG 45  CHOLHDL 3.2   Thyroid Function Tests: Recent Labs    04/14/21 0334  TSH 2.175  FREET4 0.82   Anemia Panel: Recent Labs    04/14/21 0334  VITAMINB12 1,049*  FOLATE 82.0  FERRITIN 46  TIBC 251  IRON 12*  RETICCTPCT 1.9   Sepsis Labs: Recent Labs  Lab 04/13/21 2035 04/13/21 2250  LATICACIDVEN 3.1* 3.2*    Recent Results (from the past 240 hour(s))  Culture, blood (routine x 2)     Status: None (Preliminary result)   Collection Time: 04/13/21 10:50 PM   Specimen: BLOOD  Result Value Ref Range Status   Specimen Description BLOOD LEFT ARM  Final   Special Requests   Final    BOTTLES DRAWN AEROBIC AND ANAEROBIC Blood Culture results may not be optimal due to an inadequate volume of blood received in culture bottles   Culture   Final    NO GROWTH < 12 HOURS Performed at [facility], [address], [place], [place] [address]    Report Status PENDING  Incomplete  Resp Panel by RT-PCR (Flu A&B, Covid) Nasopharyngeal Swab     Status: None   Collection Time: 04/13/21 10:50 PM   Specimen: Nasopharyngeal Swab; Nasopharyngeal(NP) swabs in vial transport medium  Result Value Ref Range Status   SARS Coronavirus 2 by RT PCR NEGATIVE NEGATIVE Final    Comment: (NOTE) SARS-CoV-2 target nucleic acids are NOT DETECTED.  The SARS-CoV-2 RNA is generally detectable in upper respiratory specimens during the acute phase of infection. The lowest concentration of SARS-CoV-2 viral copies this assay can detect is 138 copies/mL. A negative result does not preclude SARS-Cov-2 infection and should not be used as the sole basis for treatment or other patient management decisions. A negative result may occur with  improper specimen collection/handling, submission of specimen  other than nasopharyngeal swab, presence of viral mutation(s) within the areas targeted by this assay, and inadequate number of viral copies(<138 copies/mL). A negative result must be combined with clinical observations, patient history, and epidemiological information. The expected result is Negative.  Fact Sheet for Patients:  BloggerCourse.com  Fact Sheet for Healthcare Providers:  SeriousBroker.it  This test is no t yet approved or cleared by  the [employer] and  has been authorized for detection and/or diagnosis of SARS-CoV-2 by FDA under an Emergency Use Authorization (EUA). This EUA will remain  in effect (meaning this test can be used) for the duration of the COVID-19 declaration under Section 564(b)(1) of the Act, 21 U.S.C.section 360bbb-3(b)(1), unless the authorization is terminated  or revoked sooner.       Influenza A by PCR NEGATIVE NEGATIVE Final   Influenza B by PCR NEGATIVE NEGATIVE Final    Comment: (NOTE) The Xpert Xpress SARS-CoV-2/FLU/RSV plus assay is intended as an aid in the diagnosis of influenza from Nasopharyngeal swab specimens and should not be used as a sole basis for treatment. Nasal washings and aspirates are unacceptable for Xpert Xpress SARS-CoV-2/FLU/RSV testing.  Fact Sheet for Patients: BloggerCourse.com  Fact Sheet for Healthcare Providers: SeriousBroker.it  This test is not yet approved or cleared by the [place] FDA and has been authorized for detection and/or diagnosis of SARS-CoV-2 by FDA under an Emergency Use Authorization (EUA). This EUA will remain in effect (meaning this test can be used) for the duration of the COVID-19 declaration under Section 564(b)(1) of the Act, 21 U.S.C. section 360bbb-3(b)(1), unless the authorization is terminated or revoked.  Performed at [facility], [address]  [place], [place] [address]   Culture, blood (Routine X 2) w Reflex to ID Panel     Status: None (Preliminary result)   Collection Time: 04/14/21  3:34 AM   Specimen: BLOOD  Result Value Ref Range Status   Specimen Description BLOOD RIGHT ASSIST CONTROL  Final   Special Requests   Final    BOTTLES DRAWN AEROBIC AND ANAEROBIC Blood Culture adequate volume   Culture   Final    NO GROWTH < 12 HOURS Performed at [facility], [address] [place], [place] [address]    Report Status PENDING  Incomplete         Radiology Studies: MR ANKLE RIGHT WO CONTRAST  Result Date: 04/14/2021 CLINICAL DATA:  Diabetic heel wound with redness and swelling EXAM: MRI OF THE RIGHT ANKLE WITHOUT CONTRAST TECHNIQUE: Multiplanar, multisequence MR imaging of the ankle was performed. No intravenous contrast was administered. COMPARISON:  X-ray 04/13/2021 FINDINGS: TENDONS Peroneal: Intact peroneus longus and peroneus brevis tendons. Posteromedial: Distal tibialis posterior tendinosis and mild tenosynovitis. Flexor hallucis longus and flexor digitorum longus tendons intact. Anterior: Intact tibialis anterior, extensor hallucis longus and extensor digitorum longus tendons. Achilles: Distal Achilles tendinosis with low-grade interstitial tear of the central tendon distally (series 4, image 12). No full-thickness or retracted tear. No retrocalcaneal bursitis. Plantar Fascia: Intact. LIGAMENTS Lateral: The anterior talofibular, posterior talofibular, and calcaneofibular ligaments are thickened and heterogeneous, likely reflecting sequela of prior trauma. The inferior tibiofibular ligaments appear intact. Medial: Deltoid ligament and spring ligament complex appear grossly intact. CARTILAGE Ankle Joint: Diffuse chondral thinning.  Trace joint effusion. Subtalar Joints/Sinus Tarsi: Mild subtalar osteoarthritis. Trace posterior subtalar joint effusion. Preservation of the anatomic fat within the sinus tarsi. Bones: Mild  subcortical bone marrow edema at the plantar-medial aspect of the posterior calcaneus (series 4, images 23-24). No confluent low T1 signal changes are evident. No focal erosion. No acute fracture. No dislocation. Degenerative changes within the midfoot most pronounced at the talonavicular joint with prominent dorsal fragmented osteophyte. Other: Soft tissue ulceration at the posteromedial aspect of the heel. Ulcer base does not extend to the underlying calcaneal cortex. There is surrounding soft tissue edema. No organized or drainable fluid collections. IMPRESSION:  1. Soft tissue ulceration at the posteromedial aspect of the heel with surrounding soft tissue edema. Ulcer base does not extend to the underlying calcaneal cortex. Mild subcortical bone marrow edema at the plantar-medial aspect of the posterior calcaneus, likely representing reactive osteitis. No erosion or confluent low T1 signal changes to suggest acute osteomyelitis at this time. 2. Distal tibialis posterior tendinosis and mild tenosynovitis. 3. Distal Achilles tendinosis with low-grade interstitial tear of the central tendon distally. 4. Sequela of prior lateral ankle ligament injury. Electronically Signed   By: Duanne Guess D.O.   On: 04/14/2021 11:21   US Venous Img Lower Unilateral Right  Result Date: 04/13/2021 CLINICAL DATA:  Infection of the right leg. EXAM: RIGHT LOWER EXTREMITY VENOUS DOPPLER ULTRASOUND TECHNIQUE: Gray-scale sonography with compression, as well as color and duplex ultrasound, were performed to evaluate the deep venous system(s) from the level of the common femoral vein through the popliteal and proximal calf veins. COMPARISON:  March 18, 2020 FINDINGS: VENOUS Normal compressibility of the common femoral, superficial femoral, and popliteal veins, as well as the visualized calf veins. Visualized portions of profunda femoral vein and great saphenous vein unremarkable. No filling defects to suggest DVT on grayscale  or color Doppler imaging. Doppler waveforms show normal direction of venous flow, normal respiratory plasticity and response to augmentation. Limited views of the contralateral common femoral vein are unremarkable. OTHER The study is limited secondary to the patient's body habitus and diffuse soft tissue edema. Limitations: none IMPRESSION: Limited study without evidence of RIGHT lower extremity DVT. Electronically Signed   By: Aram Candela M.D.   On: 04/13/2021 23:20   DG Foot 2 Views Right  Result Date: 04/13/2021 CLINICAL DATA:  Fever, right leg pain EXAM: RIGHT FOOT - 2 VIEW COMPARISON:  None. FINDINGS: No fracture or dislocation is seen. Moderate degenerative changes of the 1st MTP joint. Degenerative changes of the tibiotalar and subtalar joints. Mild diffuse soft tissue swelling. IMPRESSION: Degenerative changes with mild diffuse soft tissue swelling. Electronically Signed   By: Charline Bills M.D.   On: 04/13/2021 23:08        Scheduled Meds:  amLODipine  5 mg Oral Daily   apixaban  5 mg Oral BID   insulin aspart  0-9 Units Subcutaneous TID WC   memantine  10 mg Oral BID   Oxcarbazepine  300 mg Oral BID   pantoprazole (PROTONIX) IV  40 mg Intravenous Q12H   QUEtiapine  100 mg Oral QHS   QUEtiapine  50 mg Oral q AM   rosuvastatin  20 mg Oral Daily   Continuous Infusions:  sodium chloride 30 mL/hr at 04/14/21 0359   ceFEPime (MAXIPIME) IV 2 g (04/14/21 1033)   [START ON 04/15/2021] vancomycin       LOS: 0 days    Time spent: 35 min    Burke Keels, MD [facility]  If 7PM-7AM, please contact night-coverage  04/14/2021, 12:25 PM

## 2021-04-14 NOTE — Progress Notes (Signed)
Pharmacy Antibiotic Note  [name] is a 65 y.o. male admitted on 04/13/2021 with diabetic foot ulcer/ wound infection .  Pharmacy has been consulted for Vancomycin dosing.  -pt also on Cefepime  Plan: Vancomycin Loading dose 2500 mg given in ED 10/14.  Will order Vancomycin 1 gram IV q24h Goal AUC 400-550. Expected AUC: 437 SCr used: 1.73 Cmin 11.8     Vd 0.5  BMI 44     Height: 5\' 6"  (167.6 cm) Weight: 126.1 kg (278 lb) IBW/kg (Calculated) : 63.8  Temp (24hrs), Avg:99.1 F (37.3 C), Min:98 F (36.7 C), Max:101.4 F (38.6 C)  Recent Labs  Lab 04/13/21 2034 04/13/21 2035 04/13/21 2250 04/14/21 0334  WBC 14.3*  --   --  16.3*  CREATININE 1.77*  --   --  1.73*  LATICACIDVEN  --  3.1* 3.2*  --      Estimated Creatinine Clearance: 53.4 mL/min (A) (by C-G formula based on SCr of 1.73 mg/dL (H)).    No Known Allergies  Antimicrobials this admission:  Vanc 10/14 (evening)>>    Cefepime 10/14 (evening)>>   Dose adjustments this admission:   Microbiology results:  BCx: NG  UCx:    Sputum:    MRSA PCR:   Thank you for allowing pharmacy to be a part of this patient's care.  Micharl Helmes A 04/14/2021 2:59 PM

## 2021-04-14 NOTE — TOC CM/SW Note (Signed)
Attempted call to spouse for assessment. Left a VM requesting a return call.   Alfonso Ramus, [place] [phone]

## 2021-04-14 NOTE — Progress Notes (Signed)
CODE SEPSIS - PHARMACY COMMUNICATION  **Broad Spectrum Antibiotics should be administered within 1 hour of Sepsis diagnosis**  Time Code Sepsis Called/Page Received: 10/14 @ 2239  Antibiotics Ordered: Cefepime, Vancomycin   Time of 1st antibiotic administration: Cefepime 2 gm on 10/14 @ 2239  Additional action taken by pharmacy:   If necessary, Name of Provider/Nurse Contacted:     Calistro Rauf D ,PharmD Clinical Pharmacist  04/14/2021  12:31 AM

## 2021-04-14 NOTE — ED Notes (Signed)
Pt to MRI

## 2021-04-14 NOTE — ED Notes (Signed)
Pt refuses to take tylenol, RN encouraged pt to take it. Pt declines

## 2021-04-14 NOTE — Plan of Care (Signed)
Pt admitted this shift, denies pain, alert and oriented x 4, and compliant with care. Pt has call bell, bed alarm on, bed low and locked. Problem: Pain Managment: Goal: General experience of comfort will improve Outcome: Progressing

## 2021-04-14 NOTE — Progress Notes (Signed)
Pharmacy Antibiotic Note  [name] is a 65 y.o. male admitted on 04/13/2021 with  wound infection .  Pharmacy has been consulted for Vancomycin dosing.  Plan: Vancomycin 1 gm IV X 1 given in ED on 10/14 @ 2304. Additional Vanc 1500 mg IV X 1 ordered to make total loading dose of 2500 mg.  Vancomycin 2 gm IV Q12H ordered to start on 10/15 @ 1100.  AUC = 446 Vanc trough = 7.5   Height: 5\' 6"  (167.6 cm) Weight: 126.1 kg (278 lb) IBW/kg (Calculated) : 63.8  Temp (24hrs), Avg:99.4 F (37.4 C), Min:99.4 F (37.4 C), Max:99.4 F (37.4 C)  Recent Labs  Lab 04/13/21 2034 04/13/21 2035 04/13/21 2250  WBC 14.3*  --   --   CREATININE 1.77*  --   --   LATICACIDVEN  --  3.1* 3.2*    Estimated Creatinine Clearance: 52.2 mL/min (A) (by C-G formula based on SCr of 1.77 mg/dL (H)).    No Known Allergies  Antimicrobials this admission:   >>    >>   Dose adjustments this admission:   Microbiology results:  BCx:   UCx:    Sputum:    MRSA PCR:   Thank you for allowing pharmacy to be a part of this patient's care.  Heidi Lemay D 04/14/2021 12:53 AM

## 2021-04-14 NOTE — ED Notes (Signed)
Pt verbalizes he will take his medication when he is in the room, RN talked to him if RN can administered medication via rectum, pt agreed at this time

## 2021-04-15 ENCOUNTER — Encounter
Admission: EM | Disposition: A | Discharge status: Home Health | Payer: Self-pay | Source: Home / Self Care | Attending: Internal Medicine

## 2021-04-15 DIAGNOSIS — L97509 Non-pressure chronic ulcer of other part of unspecified foot with unspecified severity: Secondary | ICD-10-CM

## 2021-04-15 DIAGNOSIS — N189 Chronic kidney disease, unspecified: Secondary | ICD-10-CM | POA: Diagnosis not present

## 2021-04-15 DIAGNOSIS — E11621 Type 2 diabetes mellitus with foot ulcer: Secondary | ICD-10-CM

## 2021-04-15 DIAGNOSIS — F3170 Bipolar disorder, currently in remission, most recent episode unspecified: Secondary | ICD-10-CM | POA: Diagnosis not present

## 2021-04-15 DIAGNOSIS — N179 Acute kidney failure, unspecified: Secondary | ICD-10-CM | POA: Diagnosis not present

## 2021-04-15 LAB — CBC WITH DIFFERENTIAL/PLATELET
Abs Immature Granulocytes: 0.05 10*3/uL (ref 0.00–0.07)
Basophils Absolute: 0 10*3/uL (ref 0.0–0.1)
Basophils Relative: 0 %
Eosinophils Absolute: 0.1 10*3/uL (ref 0.0–0.5)
Eosinophils Relative: 1 %
HCT: 26.6 % — ABNORMAL LOW (ref 39.0–52.0)
Hemoglobin: 8.4 g/dL — ABNORMAL LOW (ref 13.0–17.0)
Immature Granulocytes: 1 %
Lymphocytes Relative: 7 %
Lymphs Abs: 0.7 10*3/uL (ref 0.7–4.0)
MCH: 26.5 pg (ref 26.0–34.0)
MCHC: 31.6 g/dL (ref 30.0–36.0)
MCV: 83.9 fL (ref 80.0–100.0)
Monocytes Absolute: 0.7 10*3/uL (ref 0.1–1.0)
Monocytes Relative: 7 %
Neutro Abs: 8.3 10*3/uL — ABNORMAL HIGH (ref 1.7–7.7)
Neutrophils Relative %: 84 %
Platelets: 188 10*3/uL (ref 150–400)
RBC: 3.17 MIL/uL — ABNORMAL LOW (ref 4.22–5.81)
RDW: 17.8 % — ABNORMAL HIGH (ref 11.5–15.5)
WBC: 9.8 10*3/uL (ref 4.0–10.5)
nRBC: 0 % (ref 0.0–0.2)

## 2021-04-15 LAB — CREATININE, SERUM
Creatinine, Ser: 1.78 mg/dL — ABNORMAL HIGH (ref 0.61–1.24)
GFR, Estimated: 42 mL/min — ABNORMAL LOW (ref >60)

## 2021-04-15 LAB — GLUCOSE, CAPILLARY
Glucose-Capillary: 105 mg/dL — ABNORMAL HIGH (ref 70–99)
Glucose-Capillary: 91 mg/dL (ref 70–99)
Glucose-Capillary: 110 mg/dL — ABNORMAL HIGH (ref 70–99)
Glucose-Capillary: 91 mg/dL (ref 70–99)

## 2021-04-15 LAB — C-REACTIVE PROTEIN: CRP: 19.4 mg/dL — ABNORMAL HIGH (ref <1.0)

## 2021-04-15 SURGERY — IRRIGATION AND DEBRIDEMENT WOUND
Anesthesia: Choice | Laterality: Right

## 2021-04-15 NOTE — TOC Initial Note (Signed)
Transition of Care [facility]) - Initial/Assessment Note    Patient Details  Name: [name] MRN: [id number] Date of Birth: 1956/03/07  [facility] CM/SW Contact:    Liliana Cline, LCSW Phone Number: 04/15/2021, 2:51 PM  Clinical Narrative:                Spoke to patient's wife for high risk screening. Patient lives with wife who provides transportation. PCP is Dr. Candace Cruise. Patient recently went to [facility] for rehab. Patient and patient's wife do not want patient to go to SNF again. Patient's wife stated he is active with [facility] PT and OT and she would like to resume those services at discharge. TOC will notify [facility] when patient is discharged.    Expected Discharge Plan: Home w Home Health Services Barriers to Discharge: Continued Medical Work up   Patient Goals and CMS Choice Patient states their goals for this hospitalization and ongoing recovery are:: home with home health CMS Medicare.gov Compare Post Acute Care list provided to:: Patient Represenative (must comment) Choice offered to / list presented to : Spouse  Expected Discharge Plan and Services Expected Discharge Plan: Home w Home Health Services       Living arrangements for the past 2 months: Single Family Home                                      Prior Living Arrangements/Services Living arrangements for the past 2 months: Single Family Home Lives with:: Spouse Patient language and need for interpreter reviewed:: Yes Do you feel safe going back to the place where you live?: Yes      Need for Family Participation in Patient Care: Yes (Comment) Care giver support system in place?: Yes (comment) Current home services: DME Criminal Activity/Legal Involvement Pertinent to Current Situation/Hospitalization: No - Comment as needed  Activities of Daily Living      Permission Sought/Granted Permission sought to share information with : Facility Games developer granted to share information with : Yes, Verbal Permission Granted (by spouse)     Permission granted to share info w AGENCY: [facility]        Emotional Assessment         Alcohol / Substance Use: Not Applicable Psych Involvement: No (comment)  Admission diagnosis:  Diabetic foot ulcer (HCC) [O67.672, L97.509] Cellulitis of right lower extremity [L03.115] Sepsis without acute organ dysfunction, due to unspecified organism [facility]) [A41.9] Patient Active Problem List   Diagnosis Date Noted   Diabetic foot ulcer (HCC) 04/14/2021   UTI (urinary tract infection) 02/23/2021   Osteomyelitis of vertebra, sacral and sacrococcygeal region (HCC) 10/18/2020   Hyponatremia 07/07/2020   Encephalopathy 07/07/2020   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) 07/07/2020   Anemia 07/07/2020   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) 06/01/2020   Osteomyelitis of sacrum (HCC) 04/13/2020   Urinary retention 03/19/2020   History of DVT (deep vein thrombosis) 03/19/2020   Pressure injury of sacral region, stage 4 (HCC) 03/19/2020   Severe sepsis with septic shock (HCC) 03/18/2020   Obesity, Class III, BMI 40-49.9 (morbid obesity) (HCC) 03/18/2020   Cellulitis 03/18/2020   Acute cystitis without hematuria    Chronic kidney disease (CKD), stage III (moderate) (HCC) 01/20/2020   Pressure injury of skin 01/17/2020   Sepsis (HCC) 01/16/2020   Cellulitis of sacral region 01/16/2020  Acute kidney injury superimposed on chronic kidney disease (HCC) 01/16/2020   Bipolar disorder, in full remission, most recent episode mixed (HCC) 12/29/2019   High risk medication use 10/25/2019   Noncompliance with treatment plan 10/25/2019   Bipolar I disorder, most recent episode mixed (HCC) 01/07/2019   GAD (generalized anxiety disorder) 01/07/2019   Insomnia due to medical condition 01/07/2019   DVT, recurrent, lower extremity, acute  (HCC) 03/06/2016   Bipolar disorder (HCC) 01/03/2016   Lithium toxicity 01/02/2016   Chronic anticoagulation 07/06/2014   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 [place] [address] Phone: [phone] Fax: [phone]     Social Determinants of Health (SDOH) Interventions    Readmission Risk Interventions Readmission Risk Prevention Plan 04/15/2021  Transportation Screening Complete  PCP or Specialist Appt within 3-5 Days Complete  HRI or Home Care Consult Complete  Social Work Consult for Recovery Care Planning/Counseling Complete  Palliative Care Screening Not Applicable  Medication Review Oceanographer) Complete  Some recent data might be hidden

## 2021-04-15 NOTE — Progress Notes (Signed)
Patients wife expressed her concern with sending patient to any rehab facilities. She has in home health, PT, as well as herself and other family members who can help assist her with his daily cares. She asked for me to let MD, case management ware of her wishes.

## 2021-04-15 NOTE — Progress Notes (Signed)
PROGRESS NOTE    [name]  ZOX:[id number] DOB: 04/22/1956 DOA: 04/13/2021 PCP: Dione Booze, MD   Brief Narrative:  [name] is a 65 y.o. male seen in ed with complaints of right leg pain swelling drainage for past few months.Currently lives at home and wife has been managing wound care.  D/c from [facility] on the 13th of September.PT/OT and home health coming to home for wound dressing. Pt can walk with walker at baseline. Rt foot ulcer since: 2020 Rt foot Pain:no pain./ redness warmth started Thursday. / fever 101.6. Drainage: few months.  Wound clinic: started 2 years ago.  Podiatry: none Gen surgery:Dr.Marstson. Pt is uncomfortable because of iv is hurting him.  Pt was last admitted on 03/03/21 for UTI and sepsis,rt heel ulcer- stage 2, AKI, sacral ulcer.   Pt has past medical history of rt foot ulcer, sacral ulcer, osteomyelitis hyponatremia,anemia, acute kidney injury, history of DVT, cellulitis acute cystitis dysuria, history, DVT history,bipolar disorder, TBI,  Diabetes mellitus type 2.   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 Was admitted to [facility], Fall precaution. Vancomycin/ cefepime per pharmacy consult for now, convert to PO abx on discharge White blood cell normalized Blood cultures negative MRI RT  foot. was negative for osteomyelitis Patient was seen by podiatry in consultation, did bedside I&D Not planning on operative management Discussed  discharge with patient today who declined discharge secondary to concerns of management at home and requesting to discuss with wound care team tomorrow and social work to provide all supplemental services that will be needed   DM II: Ssi, glycemic protocol.  Hold glipizide to prevent any hypoglycemia.   AKI on CKD: Cont MIVF at low rate overnight.  Monitor  I's/ O's. Avoid contrast study and renally dose all needed meds.    Hyponatremia: Due to dehydration from sepsis, fever and decreased po intake.    Anemia: Since 2017, suspect multifactorial from CKD and possibly underlying IDA.  Mildly decreased since admission likely secondary to delusional effect of hydration   GAD: Cont seroquel and trileptal.   H/O rec DVT's: Pt to be continued on eliquis as pt has strong h/o blood clots in his dad and in himself.    HTN: Reasonably well-controlled We will continue pt on amlodipine.    Bipolar /Cognitive dysfunction/ GAD: Cont trileptal / Seroquel. Pt cannot give clear history because of his cog dysfunction and wife at bedside is telling us his history.   DVT prophylaxis: WJ:[id number]  Code Status: full Code Status History     Date Active Date Inactive Code Status Order ID Comments User Context   02/23/2021 1651 03/03/2021 2300 Full Code [id number]  Charise Killian, MD ED   03/18/2020 1553 03/22/2020 2055 Full Code [id number]  Lucile Shutters, MD ED   01/16/2020 1840 01/19/2020 1945 Full Code [id number]  Lorretta Harp, MD Inpatient   01/02/2016 2050 01/04/2016 2147 Full Code [id number]  Gracelyn Nurse, MD Inpatient      Questions for Most Recent  Historical Code Status (Order [id number])       Family Communication: Tried calling wife no answer, left message Disposition Plan:    Patient declined discharge today, concerned about worsening infection and appropriate arrangement for support equipment and support care for wound management at home.  We will dressed with wound care and social work  again Development worker, community called:  Podiatry Admission status: Inpatient   Consultants:  As above  Procedures:  MR ANKLE RIGHT WO CONTRAST  Result Date: 04/14/2021 CLINICAL DATA:  Diabetic heel wound with redness and swelling EXAM: MRI OF THE RIGHT ANKLE WITHOUT CONTRAST TECHNIQUE: Multiplanar, multisequence MR imaging of the ankle was performed. No intravenous contrast was administered. COMPARISON:  X-ray 04/13/2021 FINDINGS: TENDONS Peroneal: Intact peroneus longus and peroneus brevis tendons. Posteromedial: Distal tibialis posterior tendinosis and mild tenosynovitis. Flexor hallucis longus and flexor digitorum longus tendons intact. Anterior: Intact tibialis anterior, extensor hallucis longus and extensor digitorum longus tendons. Achilles: Distal Achilles tendinosis with low-grade interstitial tear of the central tendon distally (series 4, image 12). No full-thickness or retracted tear. No retrocalcaneal bursitis. Plantar Fascia: Intact. LIGAMENTS Lateral: The anterior talofibular, posterior talofibular, and calcaneofibular ligaments are thickened and heterogeneous, likely reflecting sequela of prior trauma. The inferior tibiofibular ligaments appear intact. Medial: Deltoid ligament and spring ligament complex appear grossly intact. CARTILAGE Ankle Joint: Diffuse chondral thinning.  Trace joint effusion. Subtalar Joints/Sinus Tarsi: Mild subtalar osteoarthritis. Trace posterior subtalar joint effusion. Preservation of the anatomic fat within the sinus tarsi. Bones: Mild subcortical bone marrow edema at the plantar-medial aspect of the posterior calcaneus (series 4, images 23-24). No confluent low T1 signal changes are evident. No focal erosion. No acute fracture. No dislocation. Degenerative changes within the midfoot most pronounced at the talonavicular joint with prominent dorsal fragmented osteophyte. Other: Soft tissue ulceration at the posteromedial aspect of the heel. Ulcer base does not extend to  the underlying calcaneal cortex. There is surrounding soft tissue edema. No organized or drainable fluid collections. IMPRESSION: 1. Soft tissue ulceration at the posteromedial aspect of the heel with surrounding soft tissue edema. Ulcer base does not extend to the underlying calcaneal cortex. Mild subcortical bone marrow edema at the plantar-medial aspect of the posterior calcaneus, likely representing reactive osteitis. No erosion or confluent low T1 signal changes to suggest acute osteomyelitis at this time. 2. Distal tibialis posterior tendinosis and mild tenosynovitis. 3. Distal Achilles tendinosis with low-grade interstitial tear of the central tendon distally. 4. Sequela of prior lateral ankle ligament injury. Electronically Signed   By: Duanne Guess D.O.   On: 04/14/2021 11:21   US Venous Img Lower Unilateral Right  Result Date: 04/13/2021 CLINICAL DATA:  Infection of the right leg. EXAM: RIGHT LOWER EXTREMITY VENOUS DOPPLER ULTRASOUND TECHNIQUE: Gray-scale sonography with compression, as well as color and duplex ultrasound, were performed to evaluate the deep venous system(s) from the level of the common femoral vein through the popliteal and proximal calf veins. COMPARISON:  March 18, 2020 FINDINGS: VENOUS Normal compressibility of the common femoral, superficial femoral, and popliteal veins, as well as the visualized calf veins. Visualized portions of profunda femoral vein and great saphenous vein unremarkable. No filling defects to suggest DVT on grayscale or color Doppler imaging. Doppler waveforms show normal direction of venous flow, normal respiratory plasticity and response to augmentation. Limited views of the contralateral common femoral vein are unremarkable. OTHER The study is limited secondary to the patient's body habitus and diffuse soft tissue edema. Limitations: none IMPRESSION: Limited study without evidence of  RIGHT lower extremity DVT. Electronically Signed   By: Aram Candela M.D.   On: 04/13/2021 23:20   DG Foot 2 Views Right  Result Date: 04/13/2021 CLINICAL DATA:  Fever, right leg pain EXAM: RIGHT FOOT - 2 VIEW COMPARISON:  None. FINDINGS: No fracture or dislocation is seen. Moderate degenerative changes of the 1st MTP joint. Degenerative changes of the tibiotalar and subtalar joints. Mild diffuse soft tissue swelling. IMPRESSION: Degenerative changes with mild diffuse soft tissue swelling. Electronically Signed   By: Charline Bills M.D.   On: 04/13/2021 23:08       Subjective: No acute events overnight Seen by podiatry underwent bedside wound care this morning Reports mild to moderate pain at site of procedure  Objective: Vitals:   04/14/21 2108 04/15/21 0602 04/15/21 0738 04/15/21 1151  BP: 132/67 133/65 110/64 134/77  Pulse: 93 96 91 89  Resp: 17 16 17 17   Temp: 99.8 F (37.7 C) 99.1 F (37.3 C) 99.9 F (37.7 C) 99.2 F (37.3 C)  TempSrc: Oral     SpO2: 99% 98% 98% 98%  Weight:      Height:        Intake/Output Summary (Last 24 hours) at 04/15/2021 1445 Last data filed at 04/15/2021 1352 Gross per 24 hour  Intake 633.65 ml  Output 2150 ml  Net -1516.35 ml   Filed Weights   04/13/21 2032  Weight: 126.1 kg    Examination:  General exam: Appears calm and comfortable  Respiratory system: Clear to auscultation. Respiratory effort normal. Cardiovascular system: S1 & S2 heard, RRR. No JVD, murmurs, rubs, gallops or clicks. No pedal edema. Gastrointestinal system: Abdomen is nondistended, soft and nontender. No organomegaly or masses felt. Normal bowel sounds heard. Central nervous system: Alert and oriented. No focal neurological deficits. Extremities: Gangrenous right heel injury erythematous Skin: As above otherwise no rashes, lesions or ulcers Psychiatry: Judgement and insight appear normal. Mood & affect flat     Data Reviewed: I have personally reviewed following labs and imaging studies  CBC: Recent Labs  Lab  04/13/21 2034 04/14/21 0334 04/15/21 0911  WBC 14.3* 16.3* 9.8  NEUTROABS 13.1*  --  8.3*  HGB 10.2* 9.5* 8.4*  HCT 32.4* 28.9* 26.6*  MCV 85.5 85.3 83.9  PLT 234 215 188   Basic Metabolic Panel: Recent Labs  Lab 04/13/21 2034 04/14/21 0334 04/15/21 0501  NA 128* 129*  --   K 5.0 4.5  --   CL 96* 96*  --   CO2 23 24  --   GLUCOSE 237* 157*  --   BUN 30* 30*  --   CREATININE 1.77* 1.73* 1.78*  CALCIUM 8.4* 8.3*  --   MG  --  1.7  --    GFR: Estimated Creatinine Clearance: 51.9 mL/min (A) (by C-G formula based on SCr of 1.78 mg/dL (H)). Liver Function Tests: Recent Labs  Lab 04/13/21 2034 04/14/21 0334  AST 26 19  ALT 17 15  ALKPHOS 92 80  BILITOT 0.6 0.8  PROT 8.3* 8.2*  ALBUMIN 3.1* 2.9*   No results for input(s): LIPASE, AMYLASE in the last 168 hours. No results for input(s): AMMONIA in the last 168 hours. Coagulation Profile: No results for input(s): INR, PROTIME in the last 168 hours. Cardiac Enzymes: No results for input(s): CKTOTAL, CKMB, CKMBINDEX, TROPONINI in the last 168 hours. BNP (last 3 results) No results for input(s): PROBNP in the last 8760 hours. HbA1C: No results for input(s): HGBA1C in the last 72  hours. CBG: Recent Labs  Lab 04/14/21 1146 04/14/21 1706 04/14/21 2119 04/15/21 0740 04/15/21 1154  GLUCAP 116* 84 112* 105* 91   Lipid Profile: Recent Labs    04/14/21 0334  CHOL 141  HDL 44  LDLCALC 88  TRIG 45  CHOLHDL 3.2   Thyroid Function Tests: Recent Labs    04/14/21 0334  TSH 2.175  FREET4 0.82   Anemia Panel: Recent Labs    04/14/21 0334  VITAMINB12 1,049*  FOLATE 82.0  FERRITIN 46  TIBC 251  IRON 12*  RETICCTPCT 1.9   Sepsis Labs: Recent Labs  Lab 04/13/21 2035 04/13/21 2250  LATICACIDVEN 3.1* 3.2*    Recent Results (from the past 240 hour(s))  Culture, blood (routine x 2)     Status: None (Preliminary result)   Collection Time: 04/13/21 10:50 PM   Specimen: BLOOD  Result Value Ref Range Status    Specimen Description BLOOD LEFT ARM  Final   Special Requests   Final    BOTTLES DRAWN AEROBIC AND ANAEROBIC Blood Culture results may not be optimal due to an inadequate volume of blood received in culture bottles   Culture   Final    NO GROWTH 2 DAYS Performed at [facility], [address] [place], [place] [address]    Report Status PENDING  Incomplete  Resp Panel by RT-PCR (Flu A&B, Covid) Nasopharyngeal Swab     Status: None   Collection Time: 04/13/21 10:50 PM   Specimen: Nasopharyngeal Swab; Nasopharyngeal(NP) swabs in vial transport medium  Result Value Ref Range Status   SARS Coronavirus 2 by RT PCR NEGATIVE NEGATIVE Final    Comment: (NOTE) SARS-CoV-2 target nucleic acids are NOT DETECTED.  The SARS-CoV-2 RNA is generally detectable in upper respiratory specimens during the acute phase of infection. The lowest concentration of SARS-CoV-2 viral copies this assay can detect is 138 copies/mL. A negative result does not preclude SARS-Cov-2 infection and should not be used as the sole basis for treatment or other patient management decisions. A negative result may occur with  improper specimen collection/handling, submission of specimen other than nasopharyngeal swab, presence of viral mutation(s) within the areas targeted by this assay, and inadequate number of viral copies(<138 copies/mL). A negative result must be combined with clinical observations, patient history, and epidemiological information. The expected result is Negative.  Fact Sheet for Patients:  BloggerCourse.com  Fact Sheet for Healthcare Providers:  SeriousBroker.it  This test is no t yet approved or cleared by the [place] FDA and  has been authorized for detection and/or diagnosis of SARS-CoV-2 by FDA under an Emergency Use Authorization (EUA). This EUA will remain  in effect (meaning this test can be used) for the duration of  the COVID-19 declaration under Section 564(b)(1) of the Act, 21 U.S.C.section 360bbb-3(b)(1), unless the authorization is terminated  or revoked sooner.       Influenza A by PCR NEGATIVE NEGATIVE Final   Influenza B by PCR NEGATIVE NEGATIVE Final    Comment: (NOTE) The Xpert Xpress SARS-CoV-2/FLU/RSV plus assay is intended as an aid in the diagnosis of influenza from Nasopharyngeal swab specimens and should not be used as a sole basis for treatment. Nasal washings and aspirates are unacceptable for Xpert Xpress SARS-CoV-2/FLU/RSV testing.  Fact Sheet for Patients: BloggerCourse.com  Fact Sheet for Healthcare Providers: SeriousBroker.it  This test is not yet approved or cleared by the [place] FDA and has been authorized for detection and/or diagnosis of SARS-CoV-2 by FDA under an Emergency  Use Authorization (EUA). This EUA will remain in effect (meaning this test can be used) for the duration of the COVID-19 declaration under Section 564(b)(1) of the Act, 21 U.S.C. section 360bbb-3(b)(1), unless the authorization is terminated or revoked.  Performed at [facility], [address] [place], [place] [address]   Culture, blood (Routine X 2) w Reflex to ID Panel     Status: None (Preliminary result)   Collection Time: 04/14/21  3:34 AM   Specimen: BLOOD  Result Value Ref Range Status   Specimen Description BLOOD RIGHT ASSIST CONTROL  Final   Special Requests   Final    BOTTLES DRAWN AEROBIC AND ANAEROBIC Blood Culture adequate volume   Culture   Final    NO GROWTH 1 DAY Performed at [facility], [address], [place], [place] [address]    Report Status PENDING  Incomplete         Radiology Studies: MR ANKLE RIGHT WO CONTRAST  Result Date: 04/14/2021 CLINICAL DATA:  Diabetic heel wound with redness and swelling EXAM: MRI OF THE RIGHT ANKLE WITHOUT CONTRAST TECHNIQUE: Multiplanar,  multisequence MR imaging of the ankle was performed. No intravenous contrast was administered. COMPARISON:  X-ray 04/13/2021 FINDINGS: TENDONS Peroneal: Intact peroneus longus and peroneus brevis tendons. Posteromedial: Distal tibialis posterior tendinosis and mild tenosynovitis. Flexor hallucis longus and flexor digitorum longus tendons intact. Anterior: Intact tibialis anterior, extensor hallucis longus and extensor digitorum longus tendons. Achilles: Distal Achilles tendinosis with low-grade interstitial tear of the central tendon distally (series 4, image 12). No full-thickness or retracted tear. No retrocalcaneal bursitis. Plantar Fascia: Intact. LIGAMENTS Lateral: The anterior talofibular, posterior talofibular, and calcaneofibular ligaments are thickened and heterogeneous, likely reflecting sequela of prior trauma. The inferior tibiofibular ligaments appear intact. Medial: Deltoid ligament and spring ligament complex appear grossly intact. CARTILAGE Ankle Joint: Diffuse chondral thinning.  Trace joint effusion. Subtalar Joints/Sinus Tarsi: Mild subtalar osteoarthritis. Trace posterior subtalar joint effusion. Preservation of the anatomic fat within the sinus tarsi. Bones: Mild subcortical bone marrow edema at the plantar-medial aspect of the posterior calcaneus (series 4, images 23-24). No confluent low T1 signal changes are evident. No focal erosion. No acute fracture. No dislocation. Degenerative changes within the midfoot most pronounced at the talonavicular joint with prominent dorsal fragmented osteophyte. Other: Soft tissue ulceration at the posteromedial aspect of the heel. Ulcer base does not extend to the underlying calcaneal cortex. There is surrounding soft tissue edema. No organized or drainable fluid collections. IMPRESSION: 1. Soft tissue ulceration at the posteromedial aspect of the heel with surrounding soft tissue edema. Ulcer base does not extend to the underlying calcaneal cortex. Mild  subcortical bone marrow edema at the plantar-medial aspect of the posterior calcaneus, likely representing reactive osteitis. No erosion or confluent low T1 signal changes to suggest acute osteomyelitis at this time. 2. Distal tibialis posterior tendinosis and mild tenosynovitis. 3. Distal Achilles tendinosis with low-grade interstitial tear of the central tendon distally. 4. Sequela of prior lateral ankle ligament injury. Electronically Signed   By: Duanne Guess D.O.   On: 04/14/2021 11:21   US Venous Img Lower Unilateral Right  Result Date: 04/13/2021 CLINICAL DATA:  Infection of the right leg. EXAM: RIGHT LOWER EXTREMITY VENOUS DOPPLER ULTRASOUND TECHNIQUE: Gray-scale sonography with compression, as well as color and duplex ultrasound, were performed to evaluate the deep venous system(s) from the level of the common femoral vein through the popliteal and proximal calf veins. COMPARISON:  March 18, 2020 FINDINGS: VENOUS Normal compressibility of  the common femoral, superficial femoral, and popliteal veins, as well as the visualized calf veins. Visualized portions of profunda femoral vein and great saphenous vein unremarkable. No filling defects to suggest DVT on grayscale or color Doppler imaging. Doppler waveforms show normal direction of venous flow, normal respiratory plasticity and response to augmentation. Limited views of the contralateral common femoral vein are unremarkable. OTHER The study is limited secondary to the patient's body habitus and diffuse soft tissue edema. Limitations: none IMPRESSION: Limited study without evidence of RIGHT lower extremity DVT. Electronically Signed   By: Aram Candela M.D.   On: 04/13/2021 23:20   DG Foot 2 Views Right  Result Date: 04/13/2021 CLINICAL DATA:  Fever, right leg pain EXAM: RIGHT FOOT - 2 VIEW COMPARISON:  None. FINDINGS: No fracture or dislocation is seen. Moderate degenerative changes of the 1st MTP joint. Degenerative changes of the  tibiotalar and subtalar joints. Mild diffuse soft tissue swelling. IMPRESSION: Degenerative changes with mild diffuse soft tissue swelling. Electronically Signed   By: Charline Bills M.D.   On: 04/13/2021 23:08        Scheduled Meds:  amLODipine  5 mg Oral Daily   apixaban  5 mg Oral BID   Chlorhexidine Gluconate Cloth  6 each Topical Daily   influenza vaccine adjuvanted  0.5 mL Intramuscular Tomorrow-1000   insulin aspart  0-9 Units Subcutaneous TID WC   memantine  10 mg Oral BID   Oxcarbazepine  300 mg Oral BID   pantoprazole (PROTONIX) IV  40 mg Intravenous Q12H   QUEtiapine  100 mg Oral QHS   QUEtiapine  50 mg Oral q AM   rosuvastatin  20 mg Oral Daily   Continuous Infusions:  ceFEPime (MAXIPIME) IV 2 g (04/15/21 1000)   vancomycin 1,000 mg (04/14/21 2346)     LOS: 1 day    Time spent: 35 min    Burke Keels, MD [facility]  If 7PM-7AM, please contact night-coverage  04/15/2021, 2:45 PM

## 2021-04-15 NOTE — Consult Note (Addendum)
  Subjective:  Patient ID: [name], male    DOB: Sep 15, 1955,  MRN: [id number]  A 65 y.o. male past medical history of rt foot ulcer, sacral ulcer, osteomyelitis hyponatremia,anemia, acute kidney injury, history of DVT, cellulitis acute cystitis dysuria, history, DVT history,bipolar disorder, TBI,  Diabetes mellitus type 2 presents with right heel ulceration medial side.  Appears to be pressure in nature.  Patient has been going for quite some time.  He was seen by [facility] wound care doctor on and off.  He was not regularly following up.  He is admitted for right leg pain and swelling which has improved some.  He denies any pain to the area he denies any other acute complaints.  He would like to get evaluated.  He does not see a: Foot and ankle specialist Objective:   Vitals:   04/15/21 0602 04/15/21 0738  BP: 133/65 110/64  Pulse: 96 91  Resp: 16 17  Temp: 99.1 F (37.3 C) 99.9 F (37.7 C)  SpO2: 98% 98%   General AA&O x3. Normal mood and affect.  Vascular Dorsalis pedis and posterior tibial pulses 2/4 bilat. Brisk capillary refill to all digits. Pedal hair present.  Neurologic Epicritic sensation grossly intact.  Dermatologic Right heel ulceration with fibrin granular wound base.  Does not probe down to bone.  Probes to deep tissue.  Malodor is present.  No purulent drainage noted.  No other clinical signs of infection noted.  No cellulitis noted.  Orthopedic: MMT 5/5 in dorsiflexion, plantarflexion, inversion, and eversion. Normal joint ROM without pain or crepitus.    1. Soft tissue ulceration at the posteromedial aspect of the heel with surrounding soft tissue edema. Ulcer base does not extend to the underlying calcaneal cortex. Mild subcortical bone marrow edema at the plantar-medial aspect of the posterior calcaneus, likely representing reactive osteitis. No erosion or confluent low T1 signal changes to suggest acute osteomyelitis at this time. 2. Distal tibialis  posterior tendinosis and mild tenosynovitis. 3. Distal Achilles tendinosis with low-grade interstitial tear of the central tendon distally. 4. Sequela of prior lateral ankle ligament injury.  Assessment & Plan:  Patient was evaluated and treated and all questions answered.  Right heel pressure ulcer down to the level of deep tissue but not bone -All questions and concerns were discussed with the patient in extensive detail. -MRI was negative for osteomyelitis. -At this time I will hold off on any operative management.  Bedside debridement was carried out in standard technique. -We will continue with local wound care with Betadine wet-to-dry 3 times a week dressing followed by Santyl in clinic. -Patient can be discharged from podiatric standpoint on p.o. antibiotics for 14 days -Aggressive offloading of the heel and nonweightbearing to the right lower extremity or partial weightbearing to the forefoot with forefoot wedge shoe -I will see him back in clinic in 1 week from discharge -He will need home care or family support for dressing changes -He is a high risk for below the knee amputation given the location of the wound.  If the wound regresses and involves the heel bone he may need a below the knee amputation.  I discussed this with the patient.    [name], DPM  Accessible via secure chat for questions or concerns.

## 2021-04-16 DIAGNOSIS — L03115 Cellulitis of right lower limb: Secondary | ICD-10-CM | POA: Diagnosis not present

## 2021-04-16 DIAGNOSIS — F3170 Bipolar disorder, currently in remission, most recent episode unspecified: Secondary | ICD-10-CM | POA: Diagnosis not present

## 2021-04-16 DIAGNOSIS — N179 Acute kidney failure, unspecified: Secondary | ICD-10-CM | POA: Diagnosis not present

## 2021-04-16 DIAGNOSIS — A419 Sepsis, unspecified organism: Principal | ICD-10-CM

## 2021-04-16 DIAGNOSIS — N189 Chronic kidney disease, unspecified: Secondary | ICD-10-CM | POA: Diagnosis not present

## 2021-04-16 DIAGNOSIS — I8290 Acute embolism and thrombosis of unspecified vein: Secondary | ICD-10-CM

## 2021-04-16 LAB — CBC WITH DIFFERENTIAL/PLATELET
Abs Immature Granulocytes: 0.04 10*3/uL (ref 0.00–0.07)
Basophils Absolute: 0 10*3/uL (ref 0.0–0.1)
Basophils Relative: 0 %
Eosinophils Absolute: 0.4 10*3/uL (ref 0.0–0.5)
Eosinophils Relative: 5 %
HCT: 26.5 % — ABNORMAL LOW (ref 39.0–52.0)
Hemoglobin: 8.7 g/dL — ABNORMAL LOW (ref 13.0–17.0)
Immature Granulocytes: 1 %
Lymphocytes Relative: 10 %
Lymphs Abs: 0.7 10*3/uL (ref 0.7–4.0)
MCH: 27.8 pg (ref 26.0–34.0)
MCHC: 32.8 g/dL (ref 30.0–36.0)
MCV: 84.7 fL (ref 80.0–100.0)
Monocytes Absolute: 0.6 10*3/uL (ref 0.1–1.0)
Monocytes Relative: 8 %
Neutro Abs: 5.6 10*3/uL (ref 1.7–7.7)
Neutrophils Relative %: 76 %
Platelets: 193 10*3/uL (ref 150–400)
RBC: 3.13 MIL/uL — ABNORMAL LOW (ref 4.22–5.81)
RDW: 17.7 % — ABNORMAL HIGH (ref 11.5–15.5)
WBC: 7.3 10*3/uL (ref 4.0–10.5)
nRBC: 0 % (ref 0.0–0.2)

## 2021-04-16 LAB — GLUCOSE, CAPILLARY
Glucose-Capillary: 94 mg/dL (ref 70–99)
Glucose-Capillary: 121 mg/dL — ABNORMAL HIGH (ref 70–99)

## 2021-04-16 LAB — CREATININE, SERUM
Creatinine, Ser: 1.69 mg/dL — ABNORMAL HIGH (ref 0.61–1.24)
GFR, Estimated: 44 mL/min — ABNORMAL LOW (ref >60)

## 2021-04-16 LAB — C-REACTIVE PROTEIN: CRP: 17.5 mg/dL — ABNORMAL HIGH (ref <1.0)

## 2021-04-16 MED ORDER — AMLODIPINE BESYLATE 5 MG PO TABS: 5.0000 mg | ORAL_TABLET | Freq: Every day | ORAL | 2 refills | Status: DC

## 2021-04-16 MED ORDER — AMOXICILLIN-POT CLAVULANATE 875-125 MG PO TABS: 1.0000 | ORAL_TABLET | Freq: Two times a day (BID) | ORAL | 0 refills | Status: AC

## 2021-04-16 MED ORDER — SODIUM CHLORIDE 0.9 % IV SOLN: INTRAVENOUS | Status: DC | PRN

## 2021-04-16 NOTE — Progress Notes (Signed)
Discharge Note: Reviewed discharged instructions with pt and spouse. PT and spouse verbalized understanding. IV cath intact when removed. PT discharged home with all personal belongings. Staff wheeled pt out. Pt transported to home via private vehicle.

## 2021-04-16 NOTE — TOC Progression Note (Addendum)
Transition of Care [facility]) - Progression Note    Patient Details  Name: Alfonsa Vaile MRN: [id number] Date of Birth: 14-Nov-1955  Transition of Care [facility]) CM/SW Contact  Marlowe Sax, RN Phone Number: 04/16/2021, 10:08 AM  Clinical Narrative:    Spoke with e4h patient's wife on the phone, she stated that he will continue with [facility] and that he has all the DME he needs, she will transport the patient , she does not get off work until 3 PM and will be up afterwards  Wife in the room getting the patient ready to DC home, I offered EMS for transport for safety, she declined, she stated that she has help at home and she does this all the time, she stated that she did not need EMS to transport, encouraged her to let me know if she changes her mind   Expected Discharge Plan: Home w Home Health Services Barriers to Discharge: Continued Medical Work up  Expected Discharge Plan and Services Expected Discharge Plan: Home w Home Health Services       Living arrangements for the past 2 months: Single Family Home Expected Discharge Date: 04/16/21                                     Social Determinants of Health (SDOH) Interventions    Readmission Risk Interventions Readmission Risk Prevention Plan 04/15/2021  Transportation Screening Complete  PCP or Specialist Appt within 3-5 Days Complete  HRI or Home Care Consult Complete  Social Work Consult for Recovery Care Planning/Counseling Complete  Palliative Care Screening Not Applicable  Medication Review [employer]) Complete  Some recent data might be hidden

## 2021-04-16 NOTE — Discharge Summary (Signed)
Physician Discharge Summary  [name] QIH:[id number] DOB: 1955/12/26 DOA: 04/13/2021  PCP: [name], MD  Admit date: 04/13/2021 Discharge date: 04/16/2021  Admitted From: Home  Discharge disposition: Home with home health  Recommendations for Outpatient Follow-Up:   Follow up with your primary care provider in one week.  Check CBC, BMP, magnesium in the next visit Follow-up with Dr. Posey Pronto podiatry as outpatient in 1 week.  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  Diet recommendation: Low sodium, heart healthy.  Carbohydrate-modified.    Wound care: Heel wound care.  Directions below.  Code status: Full.   History of Present Illness:   [name] is a 65 y.o. male with past medical history of right foot ulcer, sacral ulcer, osteomyelitis, hyponatremia, history of DVT, bipolar disorder, TBI, diabetes mellitus type 2 presented to hospital with right leg pain and swelling with discharge for few months.  He stated that his wife is managing his wound at home.  He was recently discharged from weight work on 30 September and PT OT and home health with doing wound dressing at home.  He has been using walker at baseline.  At this time, patient was admitted to hospital due to these symptoms and podiatry was consulted.  Hospital Course:   Following conditions were addressed during hospitalization as listed below,  Sepsis secondary to diabetic foot ulcer. Pt had fever, tachycardia, elevated lactate with cellulitis  and met sepsis criteria on admission.  Initially was started on vancomycin and cefepime.  CBC was marginally elevated on admission which subsequently trended down.  Podiatry was consulted.  MRI of the foot was negative for osteomyelitis.  Podiatry did bedside I&D on the patient.  Recommended oral antibiotic for 14 days on discharge.  Will prescribe Augmentin on discharge.   Diabetes mellitus type 2. On glipizide as outpatient.  Continue diabetic diet.   AKI on CKD: Creatinine of 1.6 prior to discharge likely at baseline.   Hyponatremia: Initially thought to be secondary to dehydration, sepsis, fever and decreased oral intake.   Anemia: Likely multifactorial from CKD and possibly underlying IDA.  Hemoglobin of 8.7 prior to discharge.  Will need outpatient monitoring and follow-up.   Generalized anxiety disorder/bipolar disorder, cognitive dysfunction.   Continue seroquel and trileptal.   History of recurrent DVT Continue Eliquis as outpatient.   Essential HTN: Continue amlodipine.    Disposition.  At this time, patient is stable for disposition home with outpatient PCP and podiatry follow-up.  Medical Consultants:   Podiatry  Procedures:    MRI of the ankle Venous duplex ultrasound Bedside I&D. Subjective:   Today, patient was seen and examined at bedside.  Denies pain, nausea, vomiting, fever, chills   Discharge Exam:   Vitals:   04/16/21 0433 04/16/21 0804  BP: 109/63 136/71  Pulse: 81 91  Resp: 16 16  Temp: 97.7 F (36.5 C) 99.1 F (37.3 C)  SpO2: 98% 99%   Vitals:   04/15/21 1608 04/15/21 1953 04/16/21 0433 04/16/21 0804  BP: 119/71 131/68 109/63 136/71  Pulse: 84 81 81  91  Resp: _0 Temp: 98 F (36.7 C) 98.8 F (37.1 C) 97.7 F (36.5 C) 99.1 F (37.3 C)  TempSrc:      SpO2: 100% 98% 98% 99%  Weight:      Height:        General: Alert awake, not in obvious distress HENT: pupils equally reacting to light,  No scleral pallor or icterus  noted. Oral mucosa is moist.  Chest:  Clear breath sounds.  Diminished breath sounds bilaterally. No crackles or wheezes.  CVS: S1 &S2 heard. No murmur.  Regular rate and rhythm. Abdomen: Soft, nontender, nondistended.  Bowel sounds are heard.   Extremities: No cyanosis, clubbing or edema.  Peripheral pulses are palpable. Psych: Alert, awake and oriented, normal mood CNS:  No cranial nerve deficits.  Power equal in all extremities.   Skin: Warm and dry.  Right heel ulcer.  The results of significant diagnostics from this hospitalization (including imaging, microbiology, ancillary and laboratory) are listed below for reference.     Diagnostic Studies:   MR ANKLE RIGHT WO CONTRAST  Result Date: 04/14/2021 CLINICAL DATA:  Diabetic heel wound with redness and swelling EXAM: MRI OF THE RIGHT ANKLE WITHOUT CONTRAST TECHNIQUE: Multiplanar, multisequence MR imaging of the ankle was performed. No intravenous contrast was administered. COMPARISON:  X-ray 04/13/2021 FINDINGS: TENDONS Peroneal: Intact peroneus longus and peroneus brevis tendons. Posteromedial: Distal tibialis posterior tendinosis and mild tenosynovitis. Flexor hallucis longus and flexor digitorum longus tendons intact. Anterior: Intact tibialis anterior, extensor hallucis longus and extensor digitorum longus tendons. Achilles: Distal Achilles tendinosis with low-grade interstitial tear of the central tendon distally (series 4, image 12). No full-thickness or retracted tear. No retrocalcaneal bursitis. Plantar Fascia: Intact. LIGAMENTS Lateral: The anterior talofibular, posterior talofibular, and calcaneofibular ligaments are thickened and heterogeneous, likely reflecting sequela of prior trauma. The inferior tibiofibular ligaments appear intact. Medial: Deltoid ligament and spring ligament complex appear grossly intact. CARTILAGE Ankle Joint: Diffuse chondral thinning.  Trace joint effusion. Subtalar Joints/Sinus Tarsi: Mild subtalar  osteoarthritis. Trace posterior subtalar joint effusion. Preservation of the anatomic fat within the sinus tarsi. Bones: Mild subcortical bone marrow edema at the plantar-medial aspect of the posterior calcaneus (series 4, images 23-24). No confluent low T1 signal changes are evident. No focal erosion. No acute fracture. No dislocation. Degenerative changes within the midfoot most pronounced at the talonavicular joint with prominent dorsal fragmented osteophyte. Other: Soft tissue ulceration at the posteromedial aspect of the heel. Ulcer base does not extend to the underlying calcaneal cortex. There is surrounding soft tissue edema. No organized or drainable fluid collections. IMPRESSION: 1. Soft tissue ulceration at the posteromedial aspect of the heel with surrounding soft tissue edema. Ulcer base does not extend to the underlying calcaneal cortex. Mild subcortical bone marrow edema at the plantar-medial aspect of the posterior calcaneus, likely representing reactive osteitis. No erosion or confluent low T1 signal changes to suggest acute osteomyelitis at this time. 2. Distal tibialis posterior tendinosis and mild tenosynovitis. 3. Distal Achilles tendinosis with low-grade interstitial tear of the central tendon distally. 4. Sequela of prior lateral ankle ligament injury. Electronically Signed   By: Davina Poke D.O.   On: 04/14/2021 11:21   US Venous Img Lower Unilateral Right  Result Date: 04/13/2021 CLINICAL DATA:  Infection of the right leg. EXAM: RIGHT LOWER EXTREMITY VENOUS DOPPLER ULTRASOUND TECHNIQUE: Gray-scale sonography with compression, as well as color and duplex ultrasound, were performed to evaluate the deep venous system(s) from the level of the  common femoral vein through the popliteal and proximal calf veins. COMPARISON:  March 18, 2020 FINDINGS: VENOUS Normal compressibility of the common femoral, superficial femoral, and popliteal veins, as well as the visualized calf veins.  Visualized portions of profunda femoral vein and great saphenous vein unremarkable. No filling defects to suggest DVT on grayscale or color Doppler imaging. Doppler waveforms show normal direction of venous flow, normal respiratory plasticity and response to augmentation. Limited views of the contralateral common femoral vein are unremarkable. OTHER The study is limited secondary to the patient's body habitus and diffuse soft tissue edema. Limitations: none IMPRESSION: Limited study without evidence of RIGHT lower extremity DVT. Electronically Signed   By: Virgina Norfolk M.D.   On: 04/13/2021 23:20   DG Foot 2 Views Right  Result Date: 04/13/2021 CLINICAL DATA:  Fever, right leg pain EXAM: RIGHT FOOT - 2 VIEW COMPARISON:  None. FINDINGS: No fracture or dislocation is seen. Moderate degenerative changes of the 1st MTP joint. Degenerative changes of the tibiotalar and subtalar joints. Mild diffuse soft tissue swelling. IMPRESSION: Degenerative changes with mild diffuse soft tissue swelling. Electronically Signed   By: Julian Hy M.D.   On: 04/13/2021 23:08     Labs:   Basic Metabolic Panel: Recent Labs  Lab 04/13/21 2034 04/14/21 0334 04/15/21 0501 04/16/21 0448  NA 128* 129*  --   --   K 5.0 4.5  --   --   CL 96* 96*  --   --   CO2 23 24  --   --   GLUCOSE 237* 157*  --   --   BUN 30* 30*  --   --   CREATININE 1.77* 1.73* 1.78* 1.69*  CALCIUM 8.4* 8.3*  --   --   MG  --  1.7  --   --    GFR Estimated Creatinine Clearance: 54.7 mL/min (A) (by C-G formula based on SCr of 1.69 mg/dL (H)). Liver Function Tests: Recent Labs  Lab 04/13/21 2034 04/14/21 0334  AST 26 19  ALT 17 15  ALKPHOS 92 80  BILITOT 0.6 0.8  PROT 8.3* 8.2*  ALBUMIN 3.1* 2.9*   No results for input(s): LIPASE, AMYLASE in the last 168 hours. No results for input(s): AMMONIA in the last 168 hours. Coagulation profile No results for input(s): INR, PROTIME in the last 168 hours.  CBC: Recent Labs   Lab 04/13/21 2034 04/14/21 0334 04/15/21 0911 04/16/21 0448  WBC 14.3* 16.3* 9.8 7.3  NEUTROABS 13.1*  --  8.3* 5.6  HGB 10.2* 9.5* 8.4* 8.7*  HCT 32.4* 28.9* 26.6* 26.5*  MCV 85.5 85.3 83.9 84.7  PLT 234 215 188 193   Cardiac Enzymes: No results for input(s): CKTOTAL, CKMB, CKMBINDEX, TROPONINI in the last 168 hours. BNP: Invalid input(s): POCBNP CBG: Recent Labs  Lab 04/15/21 0740 04/15/21 1154 04/15/21 1651 04/15/21 2053 04/16/21 0805  GLUCAP 105* 91 110* 91 94   D-Dimer No results for input(s): DDIMER in the last 72 hours. Hgb A1c No results for input(s): HGBA1C in the last 72 hours. Lipid Profile Recent Labs    04/14/21 0334  CHOL 141  HDL 44  LDLCALC 88  TRIG 45  CHOLHDL 3.2   Thyroid function studies Recent Labs    04/14/21 0334  TSH 2.175   Anemia work up Recent Labs    04/14/21 0334  VITAMINB12 1,049*  FOLATE 82.0  FERRITIN 46  TIBC 251  IRON 12*  RETICCTPCT 1.9   Microbiology Recent Results (from  the past 240 hour(s))  Culture, blood (routine x 2)     Status: None (Preliminary result)   Collection Time: 04/13/21 10:50 PM   Specimen: BLOOD  Result Value Ref Range Status   Specimen Description BLOOD LEFT ARM  Final   Special Requests   Final    BOTTLES DRAWN AEROBIC AND ANAEROBIC Blood Culture results may not be optimal due to an inadequate volume of blood received in culture bottles   Culture   Final    NO GROWTH 3 DAYS Performed at [facility], [address], [place], [place] [address]    Report Status PENDING  Incomplete  Resp Panel by RT-PCR (Flu A&B, Covid) Nasopharyngeal Swab     Status: None   Collection Time: 04/13/21 10:50 PM   Specimen: Nasopharyngeal Swab; Nasopharyngeal(NP) swabs in vial transport medium  Result Value Ref Range Status   SARS Coronavirus 2 by RT PCR NEGATIVE NEGATIVE Final    Comment: (NOTE) SARS-CoV-2 target nucleic acids are NOT DETECTED.  The SARS-CoV-2 RNA is generally detectable in  upper respiratory specimens during the acute phase of infection. The lowest concentration of SARS-CoV-2 viral copies this assay can detect is 138 copies/mL. A negative result does not preclude SARS-Cov-2 infection and should not be used as the sole basis for treatment or other patient management decisions. A negative result may occur with  improper specimen collection/handling, submission of specimen other than nasopharyngeal swab, presence of viral mutation(s) within the areas targeted by this assay, and inadequate number of viral copies(<138 copies/mL). A negative result must be combined with clinical observations, patient history, and epidemiological information. The expected result is Negative.  Fact Sheet for Patients:  EntrepreneurPulse.com.au  Fact Sheet for Healthcare Providers:  IncredibleEmployment.be  This test is no t yet approved or cleared by the [place] FDA and  has been authorized for detection and/or diagnosis of SARS-CoV-2 by FDA under an Emergency Use Authorization (EUA). This EUA will remain  in effect (meaning this test can be used) for the duration of the COVID-19 declaration under Section 564(b)(1) of the Act, 21 U.S.C.section 360bbb-3(b)(1), unless the authorization is terminated  or revoked sooner.       Influenza A by PCR NEGATIVE NEGATIVE Final   Influenza B by PCR NEGATIVE NEGATIVE Final    Comment: (NOTE) The Xpert Xpress SARS-CoV-2/FLU/RSV plus assay is intended as an aid in the diagnosis of influenza from Nasopharyngeal swab specimens and should not be used as a sole basis for treatment. Nasal washings and aspirates are unacceptable for Xpert Xpress SARS-CoV-2/FLU/RSV testing.  Fact Sheet for Patients: EntrepreneurPulse.com.au  Fact Sheet for Healthcare Providers: IncredibleEmployment.be  This test is not yet approved or cleared by the [place] FDA and has been  authorized for detection and/or diagnosis of SARS-CoV-2 by FDA under an Emergency Use Authorization (EUA). This EUA will remain in effect (meaning this test can be used) for the duration of the COVID-19 declaration under Section 564(b)(1) of the Act, 21 U.S.C. section 360bbb-3(b)(1), unless the authorization is terminated or revoked.  Performed at [facility], [address] [place], [place] [address]   Culture, blood (Routine X 2) w Reflex to ID Panel     Status: None (Preliminary result)   Collection Time: 04/14/21  3:34 AM   Specimen: BLOOD  Result Value Ref Range Status   Specimen Description BLOOD RIGHT ASSIST CONTROL  Final   Special Requests   Final    BOTTLES DRAWN AEROBIC AND ANAEROBIC Blood Culture adequate  volume   Culture   Final    NO GROWTH 2 DAYS Performed at [facility], [address] [place], [place] [address]    Report Status PENDING  Incomplete     Discharge Instructions:   Discharge Instructions     Diet - low sodium heart healthy   Complete by: As directed    Diet Carb Modified   Complete by: As directed    Discharge instructions   Complete by: As directed    Follow-up with your primary care physician in 1 week for regular checkup and blood work.  Follow-up with Dr. Posey Pronto podiatry in the clinic in 1 week.  Continue wound dressing.  If you experience worsening symptoms, please seek medical attention.Marland Kitchen   Discharge wound care:   Complete by: As directed    local wound care with Betadine wet-to-dry 3 times a week dressing followed by Santyl in clinic -Aggressive offloading of the heel and nonweightbearing to the right lower extremity or partial weightbearing to the forefoot with forefoot wedge shoe   Face-to-face encounter (required for Medicare/Medicaid patients)   Complete by: As directed    I Sofija Antwi certify that this patient is under my care and that I, or a nurse practitioner or physician's assistant working with me, had  a face-to-face encounter that meets the physician face-to-face encounter requirements with this patient on 04/16/2021. The encounter with the patient was in whole, or in part for the following medical condition(s) which is the primary reason for home health care-Diabetic foot ulcer, sepsis, chronic kidney disease, debility, deconditioning, anemia   The encounter with the patient was in whole, or in part, for the following medical condition, which is the primary reason for home health care: Diabetic foot ulcer, sepsis, chronic kidney disease, debility, deconditioning, anemia   I certify that, based on my findings, the following services are medically necessary home health services: Physical therapy   Reason for Medically Necessary Home Health Services:  Therapy- Instruction on use of Assistive Device for Ambulation on all Surfaces Therapy- Therapeutic Exercises to Increase Strength and Endurance     My clinical findings support the need for the above services: Unable to leave home safely without assistance and/or assistive device   Further, I certify that my clinical findings support that this patient is homebound due to: Unable to leave home safely without assistance   Home Health   Complete by: As directed    To provide the following care/treatments:  PT OT     Increase activity slowly   Complete by: As directed       Allergies as of 04/16/2021   No Known Allergies      Medication List     STOP taking these medications    ascorbic acid 250 MG tablet Commonly known as: VITAMIN C   clotrimazole 1 % cream Commonly known as: LOTRIMIN       TAKE these medications    amLODipine 5 MG tablet Commonly known as: NORVASC Take 1 tablet (5 mg total) by mouth daily.   amoxicillin-clavulanate 875-125 MG tablet Commonly known as: Augmentin Take 1 tablet by mouth 2 (two) times daily for 14 days.   Eliquis 5 MG Tabs tablet Generic drug: apixaban Take 5 mg by mouth 2 (two) times  daily.   glipiZIDE 10 MG 24 hr tablet Commonly known as: GLUCOTROL XL Take 10 mg by mouth daily with breakfast.   memantine 10 MG tablet Commonly known as: NAMENDA Take 10 mg by mouth  2 (two) times daily.   Omega-3 1000 MG Caps Take by mouth.   Oxcarbazepine 300 MG tablet Commonly known as: TRILEPTAL Take 1 tablet (300 mg total) by mouth 2 (two) times daily.   QUEtiapine 50 MG tablet Commonly known as: SEROquel Take 1 tablet (50 mg total) by mouth 2 (two) times daily as needed. For severe anxiety and agitation What changed: when to take this   QUEtiapine 100 MG tablet Commonly known as: SEROQUEL Take 1 tablet (100 mg total) by mouth at bedtime. What changed: Another medication with the same name was changed. Make sure you understand how and when to take each.   rosuvastatin 20 MG tablet Commonly known as: CRESTOR Take 20 mg by mouth daily.   vitamin B-12 1000 MCG tablet Commonly known as: CYANOCOBALAMIN Take 3,000 mcg by mouth daily.               Discharge Care Instructions  (From admission, onward)           Start     Ordered   04/16/21 0000  Discharge wound care:       Comments: local wound care with Betadine wet-to-dry 3 times a week dressing followed by Santyl in clinic -Aggressive offloading of the heel and nonweightbearing to the right lower extremity or partial weightbearing to the forefoot with forefoot wedge shoe   04/16/21 1004            Follow-up Information     [name], MD. Schedule an appointment as soon as possible for a visit in 1 week(s).   Specialty: Internal Medicine Contact information: [place] [address] [place], Kevin P, DPM Follow up in 1 week(s).   Specialty: Podiatry Why: follow up of foot issues Contact information: [place] [address] [phone]                  Time coordinating discharge: 39 minutes  Signed:  Kendall Justo  [facility] 04/16/2021, 10:06 AM

## 2021-04-16 NOTE — Consult Note (Signed)
WOC Nurse Consult Note: Reason for Consult: sacrum and heel Patient known to [facility] nursing team. Has been followed for sacral and heel wound at [facility], but appears lost to follow up since Aug. 2022.  Right heel wound has been addressed by podietry, selective debridement performed and I have updated the orders based on Dr. Eliane Decree notes.  Wound type: Stage 4 Pressure Injury: sacrum Stage 3 Pressure Injury: right heel  Pressure Injury POA: Yes Measurement: Sacrum: 4cm x 11cm 6cm  Heel per Dr. Eliane Decree notes, will ask staff to place measurements with dressing change  Wound bed: Sacrum:100% beefy red, some ruddy areas and probes to bone Right heel: post debridement; 90% red/10% yellow slough Drainage (amount, consistency, odor) moderate from sacrum, no odor Periwound:intact  Dressing procedure/placement/frequency: Saline moist packing to the wound, patient for DC today.  Per Dr. Allena Katz : right heel;  wound care with Betadine wet-to-dry 3 times a week  Discussed POC with patient and bedside nurse.  Re consult if needed, will not follow at this time. Thanks  Jalila Goodnough [employer], RN,CWOCN, CNS, CWON-AP [phone])

## 2021-04-18 LAB — CULTURE, BLOOD (ROUTINE X 2): Culture: NO GROWTH

## 2021-04-19 LAB — CULTURE, BLOOD (ROUTINE X 2)
Culture: NO GROWTH
Special Requests: ADEQUATE

## 2021-04-24 ENCOUNTER — Ambulatory Visit: Payer: Medicare Other | Admitting: Podiatry

## 2021-05-29 ENCOUNTER — Other Ambulatory Visit: Payer: Self-pay

## 2021-05-29 ENCOUNTER — Encounter: Payer: Medicare Other | Attending: Physician Assistant | Admitting: Physician Assistant

## 2021-05-29 DIAGNOSIS — L89154 Pressure ulcer of sacral region, stage 4: Secondary | ICD-10-CM | POA: Diagnosis not present

## 2021-05-29 DIAGNOSIS — L89613 Pressure ulcer of right heel, stage 3: Secondary | ICD-10-CM | POA: Diagnosis not present

## 2021-05-29 DIAGNOSIS — E1140 Type 2 diabetes mellitus with diabetic neuropathy, unspecified: Secondary | ICD-10-CM | POA: Diagnosis not present

## 2021-05-29 DIAGNOSIS — Z86718 Personal history of other venous thrombosis and embolism: Secondary | ICD-10-CM | POA: Insufficient documentation

## 2021-05-29 DIAGNOSIS — E11622 Type 2 diabetes mellitus with other skin ulcer: Secondary | ICD-10-CM | POA: Diagnosis not present

## 2021-05-29 DIAGNOSIS — L89313 Pressure ulcer of right buttock, stage 3: Secondary | ICD-10-CM | POA: Insufficient documentation

## 2021-05-29 NOTE — Progress Notes (Signed)
PROPHET, RENWICK ([id number]) Visit Report for 05/29/2021 Allergy List Details Patient Name: [name], [name] Date of Service: 05/29/2021 10:00 AM Medical Record Number: [id number] Patient Account Number: [id number] Date of Birth/Sex: September 23, 1955 (65 y.o. Male) Treating RN: Donnamarie Poag Primary Care Arieonna Medine: [name] Other Clinician: Referring Cherae Marton: Anselm Pancoast Treating Torrance Frech/Extender: Jeri Cos Weeks in Treatment: 0 Allergies Active Allergies No Known Allergies Allergy Notes Electronic Signature(s) Signed: 05/29/2021 2:55:55 PM By: Donnamarie Poag Entered By: Donnamarie Poag on 05/29/2021 10:43:09 Ardis, [name] ([id number]) -------------------------------------------------------------------------------- [place] Details Patient Name: [name] Date of Service: 05/29/2021 10:00 AM Medical Record Number: [id number] Patient Account Number: [id number] Date of Birth/Sex: 1956-03-22 (65 y.o. Male) Treating RN: Donnamarie Poag Primary Care Kindred Reidinger: [name] Other Clinician: Referring Jakai Onofre: Anselm Pancoast Treating Lucus Lambertson/Extender: Skipper Cliche in Treatment: 0 Visit Information Patient Arrived: Wheel Chair Arrival Time: 10:37 Accompanied By: wife Transfer Assistance: EasyPivot Patient Lift Patient Identification Verified: Yes Secondary Verification Process Completed: Yes Patient Has Alerts: Yes Patient Alerts: Patient on Blood Thinner DIABETIC Eliquis Electronic Signature(s) Signed: 05/29/2021 2:55:55 PM By: Donnamarie Poag Entered By: Donnamarie Poag on 05/29/2021 10:39:40 Fomby, [name] ([id number]) -------------------------------------------------------------------------------- Clinic Level of Care Assessment Details Patient Name: [name] Date of Service: 05/29/2021 10:00 AM Medical Record Number: [id number] Patient Account Number: [id number] Date of Birth/Sex: 1955/11/21 (65 y.o.  Male) Treating RN: Donnamarie Poag Primary Care Veryl Winemiller: [name] Other Clinician: Referring Taite Baldassari: Anselm Pancoast Treating Teagan Heidrick/Extender: Skipper Cliche in Treatment: 0 Clinic Level of Care Assessment Items TOOL 1 Quantity Score X - Use when EandM and Procedure is performed on INITIAL visit 1 0 ASSESSMENTS - Nursing Assessment / Reassessment X - General Physical Exam (combine w/ comprehensive assessment (listed just below) when performed on new 1 20 pt. evals) X- 1 25 Comprehensive Assessment (HX, ROS, Risk Assessments, Wounds Hx, etc.) ASSESSMENTS - Wound and Skin Assessment / Reassessment _0  - Dermatologic / Skin Assessment (not related to wound area) 0 ASSESSMENTS - Ostomy and/or Continence Assessment and Care _1  - Incontinence Assessment and Management 0 _2  - 0 Ostomy Care Assessment and Management (repouching, etc.) PROCESS - Coordination of Care X - Simple Patient / Family Education for ongoing care 1 15 _3  - 0 Complex (extensive) Patient / Family Education for ongoing care X- 1 10 Staff obtains Programmer, systems, Records, Test Results / Process Orders X- 1 10 Staff telephones HHA, Nursing Homes / Clarify orders / etc _4  - 0 Routine Transfer to another Facility (non-emergent condition) _5  - 0 Routine Hospital Admission (non-emergent condition) X- 1 15 New Admissions / Biomedical engineer / Ordering NPWT, Apligraf, etc. _6  - 0 Emergency Hospital Admission (emergent condition) PROCESS - Special Needs _7  - Pediatric / Minor Patient Management 0 _8  - 0 Isolation Patient Management _9  - 0 Hearing / Language / Visual special needs _10  - 0 Assessment of Community assistance (transportation, D/C planning, etc.) _11  - 0 Additional assistance / Altered mentation _12  - 0 Support Surface(s) Assessment (bed, cushion, seat, etc.) INTERVENTIONS - Miscellaneous _13  - External ear exam 0 X- 1 10 Patient Transfer (multiple staff / Civil Service fast streamer / Similar devices) _14  -  0 Simple Staple / Suture removal (25 or less) _15  - 0 Complex Staple / Suture removal (26 or more) _16  - 0 Hypo/Hyperglycemic Management (do not check if billed separately) X- 1 15 Ankle / Brachial Index (ABI) - do not check if billed separately Has the patient been seen at the hospital within the last three years: Yes Total Score: 120 Level Of Care: New/Established -  Level 4 Rabadi, Ianmichael ([id number]) Electronic Signature(s) Signed: 05/29/2021 2:55:55 PM By: Donnamarie Poag Entered By: Donnamarie Poag on 05/29/2021 11:49:24 Knutson, [name] ([id number]) -------------------------------------------------------------------------------- Encounter Discharge Information Details Patient Name: [name] Date of Service: 05/29/2021 10:00 AM Medical Record Number: [id number] Patient Account Number: [id number] Date of Birth/Sex: January 31, 1956 (65 y.o. Male) Treating RN: Donnamarie Poag Primary Care Jemel Ono: [name] Other Clinician: Referring Freada Twersky: Anselm Pancoast Treating Akeya Ryther/Extender: Skipper Cliche in Treatment: 0 Encounter Discharge Information Items Post Procedure Vitals Discharge Condition: Stable Temperature (F): 98.2 Ambulatory Status: Wheelchair Pulse (bpm): 96 Discharge Destination: Home Respiratory Rate (breaths/min): 16 Transportation: Private Auto Blood Pressure (mmHg): 132/82 Accompanied By: CAREGIVER/wife Schedule Follow-up Appointment: Yes Clinical Summary of Care: Electronic Signature(s) Signed: 05/29/2021 2:55:55 PM By: Donnamarie Poag Entered By: Donnamarie Poag on 05/29/2021 11:54:01 Vonstein, [name] ([id number]) -------------------------------------------------------------------------------- Lower Extremity Assessment Details Patient Name: [name] Date of Service: 05/29/2021 10:00 AM Medical Record Number: [id number] Patient Account Number: [id number] Date of Birth/Sex: 05-Sep-1955 (66 y.o. Male) Treating RN: Donnamarie Poag Primary Care Kelsey Edman: [name] Other Clinician: Referring Megann Easterwood: Anselm Pancoast Treating Symphonie Schneiderman/Extender: Skipper Cliche in Treatment: 0 Edema Assessment Assessed: [Left: No] [Right: Yes] [Left: Edema] [Right: :] Calf Left: Right: Point of Measurement: 33 cm From Medial Instep 46 cm Ankle Left: Right: Point of Measurement: 11 cm From Medial Instep 25.5 cm Knee To Floor Left: Right: From Medial Instep 44 cm Vascular Assessment Pulses: Dorsalis Pedis Palpable: [Right:No] Doppler Audible: [Right:Yes] Blood Pressure: Brachial: [Right:116] Ankle: [Right:Dorsalis Pedis: 122 1.05] Electronic Signature(s) Signed: 05/29/2021 2:55:55 PM By: Donnamarie Poag Entered By: Donnamarie Poag on 05/29/2021 11:02:41 Graddy, [name] ([id number]) -------------------------------------------------------------------------------- Multi Wound Chart Details Patient Name: [name] Date of Service: 05/29/2021 10:00 AM Medical Record Number: [id number] Patient Account Number: [id number] Date of Birth/Sex: Feb 07, 1956 (65 y.o. 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 Comorbid History: Anemia, Sleep Apnea, Coronary Anemia, Sleep Apnea, Coronary Anemia, Sleep Apnea, Coronary Artery Disease, Deep Vein Artery Disease, Deep Vein Artery Disease, Deep Vein Thrombosis, Hypertension, Type II Thrombosis,  Hypertension, Type II Thrombosis, Hypertension, Type II Diabetes, History of pressure Diabetes, History of pressure Diabetes, History of pressure wounds, Neuropathy wounds, Neuropathy wounds, Neuropathy Date Acquired: 02/16/2021 07/21/2018 03/15/2021 Weeks of Treatment: 0 0 0 Wound Status: Open Open Open Measurements L x W x D (cm) 1.8x1x0.1 5x2x3.6 3.5x4x0.1 Area (cm) : 1.414 7.854 10.996 Volume (cm) : 0.141 28.274 1.1 Starting Position 1 (o'clock): 9 Ending Position 1 (o'clock): 1 Maximum Distance 1 (cm): 2.7 Undermining: No Yes No 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 Exudate Amount: Medium Large Medium Exudate Type: Serosanguineous Serosanguineous Serosanguineous Exudate Color: red, brown red, brown red, brown Granulation Amount: Large (67-100%) Large (67-100%) Large (67-100%) Granulation Quality: Red, Pink Red, Pink Red, Pink Necrotic Amount: Small (1-33%) Small (1-33%) Small (1-33%) Necrotic Tissue: Eschar, Adherent [place] Exposed Structures: Fat Layer (Subcutaneous Tissue): Fat Layer (Subcutaneous Tissue): Fat Layer (Subcutaneous Tissue): Yes Yes Yes Fascia: No Muscle: Yes Fascia: No Tendon: No Fascia: No Tendon: No Muscle: No Tendon: No Muscle: No Joint: No Joint: No Joint: No Bone: No Bone:  No Bone: No Treatment Notes Electronic Signature(s) Signed: 05/29/2021 2:55:55 PM By: Emmaline Life, [name] ([id number]) Entered By: Donnamarie Poag on 05/29/2021 11:38:14 [place], [name] ([id number]) -------------------------------------------------------------------------------- Multi-Disciplinary Care Plan Details Patient Name: [name] Date of Service: 05/29/2021 10:00 AM Medical Record Number: [id number] Patient Account Number: [id number] Date of Birth/Sex: Jul 15, 1955 (65 y.o. Male) Treating RN: Donnamarie Poag Primary Care Dezi Brauner: [name] Other Clinician: Referring  Xylon Croom: Anselm Pancoast Treating Mellony Danziger/Extender: Skipper Cliche in Treatment: 0 Active Inactive Orientation to the Wound Care Program Nursing Diagnoses: Knowledge deficit related to the wound healing center program Goals: Patient/caregiver will verbalize understanding of the [facility] Program Date Initiated: 05/29/2021 Target Resolution Date: 06/14/2021 Goal Status: Active Interventions: Provide education on orientation to the wound center Notes: Wound/Skin Impairment Nursing Diagnoses: Impaired tissue integrity Knowledge deficit related to smoking impact on wound healing Knowledge deficit related to ulceration/compromised skin integrity Goals: Patient/caregiver will verbalize understanding of skin care regimen Date Initiated: 05/29/2021 Target Resolution Date: 06/14/2021 Goal Status: Active Ulcer/skin breakdown will have a volume reduction of 30% by week 4 Date Initiated: 05/29/2021 Target Resolution Date: 06/28/2021 Goal Status: Active Ulcer/skin breakdown will have a volume reduction of 50% by week 8 Date Initiated: 05/29/2021 Target Resolution Date: 07/29/2021 Goal Status: Active Ulcer/skin breakdown will have a volume reduction of 80% by week 12 Date Initiated: 05/29/2021 Target Resolution Date: 08/28/2021 Goal Status: Active Interventions: Assess patient/caregiver ability to obtain necessary supplies Assess patient/caregiver ability to perform ulcer/skin care regimen upon admission and as needed Assess ulceration(s) every visit Notes: Electronic Signature(s) Signed: 05/29/2021 2:55:55 PM By: Donnamarie Poag Entered By: Donnamarie Poag on 05/29/2021 11:37:52 Sakata, [name] ([id number]) -------------------------------------------------------------------------------- Pain Assessment Details Patient Name: [name] Date of Service: 05/29/2021 10:00 AM Medical Record Number: [id number] Patient Account Number: [id number] Date of Birth/Sex:  1956-05-09 (65 y.o. Male) Treating RN: Donnamarie Poag Primary Care Dreyah Montrose: [name] Other Clinician: Referring Terance Pomplun: Anselm Pancoast Treating Flemon Kelty/Extender: Skipper Cliche in Treatment: 0 Active Problems Location of Pain Severity and Description of Pain Patient Has Paino No Site Locations Rate the pain. Current Pain Level: 0 Pain Management and Medication Current Pain Management: Electronic Signature(s) Signed: 05/29/2021 2:55:55 PM By: Donnamarie Poag Entered By: Donnamarie Poag on 05/29/2021 10:39:54 Fries, [name] ([id number]) -------------------------------------------------------------------------------- Patient/Caregiver Education Details Patient Name: [name] Date of Service: 05/29/2021 10:00 AM Medical Record Number: [id number] Patient Account Number: [id number] Date of Birth/Gender: September 23, 1955 (65 y.o. Male) Treating RN: Donnamarie Poag Primary Care Physician: [name] Other Clinician: Referring Physician: Anselm Pancoast Treating Physician/Extender: Skipper Cliche in Treatment: 0 Education Assessment Education Provided To: Patient and Caregiver Education Topics Provided Basic Hygiene: Elevated Blood Sugar/ Impact on Healing: Medication Safety: Pressure: [facility] [place]: Wound Debridement: Wound/Skin Impairment: Electronic Signature(s) Signed: 05/29/2021 2:55:55 PM By: Donnamarie Poag Entered By: Donnamarie Poag on 05/29/2021 11:50:43 Peyser, [name] ([id number]) -------------------------------------------------------------------------------- Wound Assessment Details Patient Name: [name] Date of Service: 05/29/2021 10:00 AM Medical Record Number: [id number] Patient Account Number: [id number] Date of Birth/Sex: December 31, 1955 (65 y.o. Male) Treating RN: Donnamarie Poag Primary Care Thaddeus Evitts: [name] Other Clinician: Referring Calyssa Zobrist: Anselm Pancoast Treating Alynna Hargrove/Extender: Skipper Cliche  in Treatment: 0 Wound Status Wound Number: 1 Primary Pressure Ulcer Etiology: Wound Location: Right Calcaneus Secondary Diabetic Wound/Ulcer of the Lower Extremity Wounding Event: Gradually Appeared Etiology: Date Acquired: 02/16/2021 Wound Open Weeks Of Treatment: 0 Status: Clustered Wound: No Comorbid Anemia, Sleep Apnea, Coronary Artery Disease, Deep History: Vein Thrombosis, Hypertension, Type II Diabetes, History of pressure wounds, Neuropathy  Photos Wound Measurements Length: (cm) 1.8 Width: (cm) 1 Depth: (cm) 0.1 Area: (cm) 1.414 Volume: (cm) 0.141 % Reduction in Area: % Reduction in Volume: Tunneling: No Undermining: No Wound Description Classification: Category/Stage III Exudate Amount: Medium Exudate Type: Serosanguineous Exudate Color: red, brown Foul Odor After Cleansing: No Slough/Fibrino Yes Wound Bed Granulation Amount: Large (67-100%) Exposed Structure Granulation Quality: Red, Pink Fascia Exposed: No Necrotic Amount: Small (1-33%) Fat Layer (Subcutaneous Tissue) Exposed: Yes Necrotic Quality: Eschar, Adherent Slough Tendon Exposed: No Muscle Exposed: No Joint Exposed: No Bone Exposed: No Treatment Notes Wound #1 (Calcaneus) Wound Laterality: Right Cleanser Byram Ancillary Kit - 15 Day Supply Discharge Instruction: Use supplies as instructed; Kit contains: (15) Saline Bullets; (15) 3x3 Gauze; 15 pr Gloves Tipping, Kenroy ([id number]) Normal Saline Discharge Instruction: Wash your hands with soap and water. Remove old dressing, discard into plastic bag and place into trash. Cleanse the wound with Normal Saline prior to applying a clean dressing using gauze sponges, not tissues or cotton balls. Do not scrub or use excessive force. Pat dry using gauze sponges, not tissue or cotton balls. Soap and Water Discharge Instruction: Gently cleanse wound with antibacterial soap, rinse and pat dry prior to dressing wounds Peri-Wound  Care Topical Primary Dressing Silvercel Small 2x2 (in/in) Discharge Instruction: Apply Silvercel Small 2x2 (in/in) as instructed Secondary Dressing Mepilex Border Flex, 4x4 (in/in) Discharge Instruction: Apply to wound as directed. Do not cut. Secured With Compression Wrap Compression Stockings Environmental education officer) Signed: 05/29/2021 2:55:55 PM By: Donnamarie Poag Entered By: Donnamarie Poag on 05/29/2021 11:07:05 Lavin, [name] ([id number]) -------------------------------------------------------------------------------- Wound Assessment Details Patient Name: [name] Date of Service: 05/29/2021 10:00 AM Medical Record Number: [id number] Patient Account Number: [id number] Date of Birth/Sex: Jul 28, 1955 (65 y.o. Male) Treating RN: Donnamarie Poag Primary Care Clebert Wenger: [name] Other Clinician: Referring Ardys Hataway: Anselm Pancoast Treating Kenyada Dosch/Extender: Skipper Cliche in Treatment: 0 Wound Status Wound Number: 2 Primary Pressure Ulcer Etiology: Wound Location: Sacrum Wound Open Wounding Event: Gradually Appeared Status: Date Acquired: 07/21/2018 Comorbid Anemia, Sleep Apnea, Coronary Artery Disease, Deep Vein Weeks Of Treatment: 0 History: Thrombosis, Hypertension, Type II Diabetes, History of Clustered Wound: No pressure wounds, Neuropathy Photos Wound Measurements Length: (cm) 8.5 % Reduct Width: (cm) 5 % Reduct Depth: (cm) 3.6 Tunnelin Area: (cm) 33.379 Undermi Volume: (cm) 120.166 Start Ending Maximu ion in Area: -325% ion in Volume: -325% g: No ning: Yes ing Position (o'clock): 9 Position (o'clock): 1 m Distance: (cm) 2.7 Wound Description Classification: Category/Stage IV Foul Odo Exudate Amount: Large Slough/F Exudate Type: Serosanguineous Exudate Color: red, brown r After Cleansing: No ibrino Yes Wound Bed Granulation Amount: Large (67-100%) Exposed Structure Granulation Quality: Red, Pink Fascia Exposed:  No Necrotic Amount: Small (1-33%) Fat Layer (Subcutaneous Tissue) Exposed: Yes Tendon Exposed: No Muscle Exposed: Yes Necrosis of Muscle: Joint Exposed: No Bone Exposed: No Treatment Notes Wound #2 (Sacrum) Cleanser Byram Ancillary Kit - [address] Day Supply [place], [name] ([id number]) Discharge Instruction: Use supplies as instructed; Kit contains: (15) Saline Bullets; (15) 3x3 Gauze; 15 pr Gloves Dakin 16 (oz) 0.25 Discharge Instruction: Use as directed. Normal Saline Discharge Instruction: Wash your hands with soap and water. Remove old dressing, discard into plastic bag and place into trash. Cleanse the wound with Normal Saline prior to applying a clean dressing using gauze sponges, not tissues or cotton balls. Do not scrub or use excessive force. Pat dry using gauze sponges, not tissue or cotton balls. Soap and Water Discharge Instruction: Gently cleanse wound with antibacterial soap, rinse and  pat dry prior to dressing wounds Peri-Wound Care Topical Primary Dressing Gauze Discharge Instruction: Dakins gauze-As directed: dry, moistened with saline or moistened with Dakins Solution Secondary Dressing ABD Pad 5x9 (in/in) Discharge Instruction: Cover with ABD pad Secured With 79M Medipore H Soft Cloth Surgical Tape, 2x2 (in/yd) Compression Wrap Compression Stockings Add-Ons Electronic Signature(s) Signed: 05/29/2021 2:55:55 PM By: Donnamarie Poag Entered By: Donnamarie Poag on 05/29/2021 11:42:10 Figgs, [name] ([id number]) -------------------------------------------------------------------------------- [place] Details Patient Name: [name] Date of Service: 05/29/2021 10:00 AM Medical Record Number: [id number] Patient Account Number: [id number] Date of Birth/Sex: 21-Sep-1955 (65 y.o. Male) Treating RN: Donnamarie Poag Primary Care Jedrek Dinovo: [name] Other Clinician: Referring Zinnia Tindall: Anselm Pancoast Treating Keviana Guida/Extender: Skipper Cliche in  Treatment: 0 Vital Signs Time Taken: 10:40 Temperature (F): 98.2 Height (in): 66 Pulse (bpm): 96 Source: Stated Respiratory Rate (breaths/min): 16 Unable to obtain height and weight: Patient Refused Blood Pressure (mmHg): 132/82 Reference Range: 80 - 120 mg / dl Electronic Signature(s) Signed: 05/29/2021 2:55:55 PM By: Donnamarie Poag Entered ByDonnamarie Poag on 05/29/2021 10:42:45

## 2021-05-29 NOTE — Progress Notes (Signed)
DIXON, LUCZAK ([id number]) Visit Report for 05/29/2021 Chief Complaint Document Details Patient Name: [name], [name] Date of Service: 05/29/2021 10:00 AM Medical Record Number: [id number] Patient Account Number: [id number] Date of Birth/Sex: 08/04/1955 (65 y.o. Male) Treating RN: Donnamarie Poag Primary Care Provider: [name] Other Clinician: Referring Provider: Anselm Pancoast Treating Provider/Extender: Skipper Cliche in Treatment: 0 Information Obtained from: Patient Chief Complaint Sacral, right gluteal, and right heel ulcers Electronic Signature(s) Signed: 05/29/2021 11:25:16 AM By: [name] PA-C Entered By: [name] on 05/29/2021 11:25:16 Meixner, [name] ([id number]) -------------------------------------------------------------------------------- Debridement Details Patient Name: [name] Date of Service: 05/29/2021 10:00 AM Medical Record Number: [id number] Patient Account Number: [id number] Date of Birth/Sex: 1955/08/15 (65 y.o. Male) Treating RN: Donnamarie Poag Primary Care Provider: [name] Other Clinician: Referring Provider: Anselm Pancoast Treating Provider/Extender: Skipper Cliche in Treatment: 0 Debridement Performed for Wound #2 Sacrum Assessment: Performed By: Physician Tommie Sams., PA-C Debridement Type: Debridement Level of Consciousness (Pre- Awake and Alert procedure): Pre-procedure Verification/Time Out Yes - 11:30 Taken: Start Time: 11:30 Pain Control: Lidocaine Total Area Debrided (L x W): 3 (cm) x 3 (cm) = 9 (cm) Tissue and other material Viable, Non-Viable, Slough, Subcutaneous, Slough debrided: Level: Skin/Subcutaneous Tissue Debridement Description: Excisional Instrument: Curette Bleeding: Minimum Hemostasis Achieved: Pressure Response to Treatment: Procedure was tolerated well Level of Consciousness (Post- Awake and Alert procedure): Post Debridement Measurements of  Total Wound Length: (cm) 8.5 Stage: Category/Stage IV Width: (cm) 5 Depth: (cm) 3.6 Volume: (cm) 120.166 Character of Wound/Ulcer Post Debridement: Improved Post Procedure Diagnosis Same as Pre-procedure Electronic Signature(s) Signed: 05/29/2021 2:55:55 PM By: Donnamarie Poag Signed: 05/29/2021 4:57:43 PM By: [name] PA-C Entered By: Donnamarie Poag on 05/29/2021 11:39:44 [name], [name] ([id number]) -------------------------------------------------------------------------------- Debridement Details Patient Name: [name] Date of Service: 05/29/2021 10:00 AM Medical Record Number: [id number] Patient Account Number: [id number] Date of Birth/Sex: [birth year] (65 y.o. Male) Treating RN: Donnamarie Poag Primary Care Provider: [name] Other Clinician: Referring Provider: Anselm Pancoast Treating Provider/Extender: Skipper Cliche in Treatment: 0 Debridement Performed for Wound #1 Right Calcaneus Assessment: Performed By: Physician Tommie Sams., PA-C Debridement Type: Debridement Severity of Tissue Pre Debridement: Fat layer exposed Level of Consciousness (Pre- Awake and Alert procedure): Pre-procedure Verification/Time Out Yes - 11:30 Taken: Start Time: 11:36 Pain Control: Lidocaine Total Area Debrided (L x W): 2.5 (cm) x 1 (cm) = 2.5 (cm) Tissue and other material Viable, Non-Viable, Callus, Slough, Subcutaneous, Biofilm, Slough debrided: Level: Skin/Subcutaneous Tissue Debridement Description: Excisional Instrument: Curette Bleeding: Minimum Hemostasis Achieved: Pressure Response to Treatment: Procedure was tolerated well Level of Consciousness (Post- Awake and Alert procedure): Post Debridement Measurements of Total Wound Length: (cm) 2.5 Stage: Category/Stage III Width: (cm) 1 Depth: (cm) 0.1 Volume: (cm) 0.196 Character of Wound/Ulcer Post Debridement: Improved Severity of Tissue Post Debridement: Fat layer exposed Post Procedure  Diagnosis Same as Pre-procedure Electronic Signature(s) Signed: 05/29/2021 2:55:55 PM By: Donnamarie Poag Signed: 05/29/2021 4:57:43 PM By: [name] PA-C Entered By: Donnamarie Poag on 05/29/2021 11:41:09 [name], [name] ([id number]) -------------------------------------------------------------------------------- HPI Details Patient Name: [name] Date of Service: 05/29/2021 10:00 AM Medical Record Number: [id number] Patient Account Number: [id number] Date of Birth/Sex: Nov 11, 1955 (65 y.o. Male) Treating RN: Donnamarie Poag Primary Care Provider: [name] Other Clinician: Referring Provider: Anselm Pancoast Treating Provider/Extender: Skipper Cliche in Treatment: 0 History of Present Illness HPI Description: 05/29/2021 this is a patient who presents today for initial inspection here in the clinic concerning wounds that he has over the right heel  and the sacral region. Unfortunately the sacral wound is starting to spread off to the right gluteal location due to how he sits always leaning towards the right side in his chair. His wife is present she is the primary caregiver though she is not home with him all the time she does have to work. She does do an excellent job however it appears to be in trying to keep things under good control for him. The patient is not able to change positions himself nor walk by himself so he is pretty much at the mercy of the position he is putting when she is gone and this tends to be his chair which she sits and most of the day. Obviously this I think is the main culprit for what is going on currently. It was actually in January 2020 when the sacral wound started. It was in September 2022 when the wound started to spread more to the right gluteal location. Subsequently in August 2022 is when he had been in a skilled nursing facility and the heel started to give him trouble as well. That does not seem to be doing nearly as poorly as the  sacral region. He was hospitalized in October 2022 secondary to sepsis and this was in regard to the foot and was sent to skilled nursing again he is now back at home. He did have a wound VAC for the sacral wound over the summer 2022 but being in and out of facilities this ended up getting sent back. The patient does have [facility] home health that comes out 1 time per week to help with care. His most recent hemoglobin A1c was 6.9 in August 2022. Patient's met past medical history includes generalized muscle weakness, bipolar disorder, diabetes mellitus type 2, hypertension, long-term use of anticoagulant therapy due to frequent blood clots/DVTs. He also has a history of traumatic brain injury. Electronic Signature(s) Signed: 05/29/2021 4:54:34 PM By: [name] PA-C Entered By: [name] on 05/29/2021 16:54:34 [name], [name] ([id number]) -------------------------------------------------------------------------------- Physical Exam Details Patient Name: [name] Date of Service: 05/29/2021 10:00 AM Medical Record Number: [id number] Patient Account Number: [id number] Date of Birth/Sex: [birth year] (65 y.o. Male) Treating RN: Donnamarie Poag Primary Care Provider: [name] Other Clinician: Referring Provider: Anselm Pancoast Treating Provider/Extender: Skipper Cliche in Treatment: 0 Constitutional sitting or standing blood pressure is within target range for patient.. pulse regular and within target range for patient.Marland Kitchen respirations regular, non- labored and within target range for patient.Marland Kitchen temperature within target range for patient.. Well-nourished and well-hydrated in no acute distress. Eyes conjunctiva clear no eyelid edema noted. pupils equal round and reactive to light and accommodation. Ears, Nose, Mouth, and Throat no gross abnormality of ear auricles or external auditory canals. normal hearing noted during conversation. mucus membranes  moist. Respiratory normal breathing without difficulty. Cardiovascular 2+ dorsalis pedis/posterior tibialis pulses. 1+ pitting edema of the bilateral lower extremities. Musculoskeletal Patient unable to walk without assistance. no significant deformity or arthritic changes, no loss or range of motion, no clubbing. Notes Upon inspection patient's wounds appear to be quite significant as far as the sacral wound is concerned this is quite deep and though there is not direct bone exposure there is definite signs of muscle exposure at this point. With regard to the heel ulcer this is going require some debridement as well so did the sacral wound. The heel is not doing nearly as poorly good news is he does have good arterial flow and seems to  have good pulses today as well which is great news he has some mild edema but nothing too much from the standpoint of swelling which is good news. Electronic Signature(s) Signed: 05/29/2021 4:55:23 PM By: [name] PA-C Entered By: [name] on 05/29/2021 16:55:23 [name], [name] ([id number]) -------------------------------------------------------------------------------- Physician Orders Details Patient Name: [name] Date of Service: 05/29/2021 10:00 AM Medical Record Number: [id number] Patient Account Number: [id number] Date of Birth/Sex: 1956-05-14 (65 y.o. 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 o Nurse Visit as needed [facility] for wound care. May utilize formulary equivalent dressing for wound treatment orders unless otherwise specified. Home Health Nurse may visit [facility] to address patientos wound care needs. o Scheduled days for dressing changes to be completed; exception, patient has scheduled wound care visit that day. o **Please direct any NON-WOUND related issues/requests for orders to patient's Primary Care Physician. **If current dressing causes regression in wound condition, may D/C ordered dressing product/s and apply Normal Saline Moist Dressing daily until next [place] or Other MD appointment. **Notify [facility] of regression in wound condition at [phone]. Bathing/ Shower/ Hygiene o May shower with wound dressing protected with water repellent cover or cast protector. o No tub bath. Anesthetic (Use 'Patient Medications' Section for Anesthetic Order Entry) o Lidocaine applied to wound bed Edema Control - Lymphedema / Segmental Compressive Device / Other o Elevate leg(s) parallel to the floor when sitting. o DO YOUR BEST to sleep in the bed at night. DO NOT sleep in your recliner. Long hours of sitting in a recliner leads to swelling of the legs and/or potential wounds on your backside. Off-Loading o Gel wheelchair cushion o Low air-loss mattress (Group 2) o Turn and reposition every 2 hours o Other: - PRAFO boot in bed keep pressure off of sacrum/gluteus and right heel- Additional Orders / Instructions o Follow Nutritious Diet and Increase Protein Intake Wound Treatment Wound #1 - Calcaneus Wound Laterality: Right Cleanser: Byram Ancillary Kit - 15 Day Supply 1 x Per Day/15  Days Discharge Instructions: Use supplies as instructed; Kit contains: (15) Saline Bullets; (15) 3x3 Gauze; 15 pr Gloves Cleanser: Normal Saline 1 x Per Day/15 Days [name], [name] ([id number]) Discharge Instructions: Wash your hands with soap and water. Remove old dressing, discard into plastic bag and place into trash. Cleanse the wound with Normal Saline prior to applying a clean dressing using gauze sponges, not tissues or cotton balls. Do not scrub or use excessive force. Pat dry using gauze sponges, not tissue or cotton balls. Cleanser: Soap and Water 1 x Per Day/15 Days Discharge Instructions: Gently cleanse wound with antibacterial soap, rinse and pat dry prior to dressing wounds Primary Dressing: Silvercel Small 2x2 (in/in) 1 x Per Day/15 Days Discharge Instructions: Apply Silvercel Small 2x2 (in/in) as instructed Secondary Dressing: Mepilex Border Flex, 4x4 (in/in) 1 x Per Day/15 Days Discharge Instructions:  Apply to wound as directed. Do not cut. Wound #2 - Sacrum Cleanser: Byram Ancillary Kit - 15 Day Supply 1 x Per Day/15 Days Discharge Instructions: Use supplies as instructed; Kit contains: (15) Saline Bullets; (15) 3x3 Gauze; 15 pr Gloves Cleanser: Dakin 16 (oz) 0.25 1 x Per Day/15 Days Discharge Instructions: Use as directed. Cleanser: Normal Saline 1 x Per ZOX/09 Days Discharge Instructions: Wash your hands with soap and water. Remove old dressing, discard into plastic bag and place into trash. Cleanse the wound with Normal Saline prior to applying a clean dressing using gauze sponges, not tissues or cotton balls. Do not scrub or use excessive force. Pat dry using gauze sponges, not tissue or cotton balls. Cleanser: Soap and Water 1 x Per UEA/54 Days Discharge Instructions: Gently cleanse wound with antibacterial soap, rinse and pat dry prior to dressing wounds Primary Dressing: Gauze 1 x Per Day/15 Days Discharge Instructions: Dakins gauze-As directed: dry, moistened  with saline or moistened with Dakins Solution Secondary Dressing: ABD Pad 5x9 (in/in) 1 x Per Day/15 Days Discharge Instructions: Cover with ABD pad Secured With: 69M Medipore H Soft Cloth Surgical Tape, 2x2 (in/yd) 1 x Per Day/15 Days Electronic Signature(s) Signed: 05/29/2021 2:55:55 PM By: Donnamarie Poag Signed: 05/29/2021 4:57:43 PM By: [name] PA-C Entered By: Donnamarie Poag on 05/29/2021 11:45:46 [name], [name] ([id number]) -------------------------------------------------------------------------------- Problem List Details Patient Name: [name] Date of Service: 05/29/2021 10:00 AM Medical Record Number: [id number] Patient Account Number: [id number] Date of Birth/Sex: June 25, 1956 (65 y.o. Male) Treating RN: Donnamarie Poag Primary Care Provider: [name] Other Clinician: Referring Provider: Anselm Pancoast Treating Provider/Extender: Skipper Cliche in Treatment: 0 Active Problems ICD-10 Encounter Code Description Active Date MDM Diagnosis L89.613 Pressure ulcer of right heel, stage 3 05/29/2021 No Yes L89.154 Pressure ulcer of sacral region, stage 4 05/29/2021 No Yes M62.81 Muscle weakness (generalized) 05/29/2021 No Yes F31.9 Bipolar disorder, unspecified 05/29/2021 No Yes E11.622 Type 2 diabetes mellitus with other skin ulcer 05/29/2021 No Yes I10 Essential (primary) hypertension 05/29/2021 No Yes Z79.01 Long term (current) use of anticoagulants 05/29/2021 No Yes 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 05/29/2021 No Yes Inactive Problems Resolved Problems Electronic Signature(s) Signed: 05/29/2021 4:51:36 PM By: [name] PA-C Previous Signature: 05/29/2021 11:25:50 AM Version By: [name] PA-C Previous Signature: 05/29/2021 11:24:32 AM Version By: [name] PA-C Entered By: [name] on 05/29/2021 16:51:36 [name], [name]  ([id number]) -------------------------------------------------------------------------------- Progress Note Details Patient Name: [name] Date of Service: 05/29/2021 10:00 AM Medical Record Number: [id number] Patient Account Number: [id number] Date of Birth/Sex: 08/26/1955 (65 y.o. Male) Treating RN: Donnamarie Poag Primary Care Provider: [name] Other Clinician: Referring Provider: Anselm Pancoast Treating Provider/Extender: Skipper Cliche in Treatment: 0 Subjective Chief Complaint Information obtained from Patient Sacral, right gluteal, and right heel ulcers History of Present Illness (HPI) 05/29/2021 this is a patient who presents today for initial inspection here in the clinic concerning wounds that he has over the right heel and the sacral region. Unfortunately the sacral wound is starting to spread off to the right gluteal location due to how he sits always leaning towards the right side in his chair. His wife is present she is the primary caregiver though she is not home with him all the time she does have to work. She does do an excellent job however it appears to be in trying to keep things under good control for him. The patient is not able to change positions himself nor walk  by himself so he is pretty much at the mercy of the position he is putting when she is gone and this tends to be his chair which she sits and most of the day. Obviously this I think is the main culprit for what is going on currently. It was actually in January 2020 when the sacral wound started. It was in September 2022 when the wound started to spread more to the right gluteal location. Subsequently in August 2022 is when he had been in a skilled nursing facility and the heel started to give him trouble as well. That does not seem to be doing nearly as poorly as the sacral region. He was hospitalized in October 2022 secondary to sepsis and this was in regard to the foot and was sent to  skilled nursing again he is now back at home. He did have a wound VAC for the sacral wound over the summer 2022 but being in and out of facilities this ended up getting sent back. The patient does have [facility] home health that comes out 1 time per week to help with care. His most recent hemoglobin A1c was 6.9 in August 2022. Patient's met past medical history includes generalized muscle weakness, bipolar disorder, diabetes mellitus type 2, hypertension, long-term use of anticoagulant therapy due to frequent blood clots/DVTs. He also has a history of traumatic brain injury. Patient History Information obtained from Patient. Allergies No Known Allergies Social History Never smoker, Marital Status - Married, Alcohol Use - Never, Drug Use - No History, Caffeine Use - Rarely. Medical History Hematologic/Lymphatic Patient has history of Anemia Respiratory Patient has history of Sleep Apnea Cardiovascular Patient has history of Coronary Artery Disease, Deep Vein Thrombosis, Hypertension Endocrine Patient has history of Type II Diabetes Integumentary (Skin) Patient has history of History of pressure wounds Neurologic Patient has history of Neuropathy Denies history of Seizure Disorder Patient is treated with Oral Agents. Blood sugar is not tested. Review of Systems (ROS) Constitutional Symptoms (General Health) Denies complaints or symptoms of Fatigue, Fever, Chills, Marked Weight Change. Eyes Complains or has symptoms of Glasses / Contacts. Ear/Nose/Mouth/Throat Denies complaints or symptoms of Difficult clearing ears, Sinusitis. Respiratory Complains or has symptoms of Shortness of Breath. Cardiovascular Complains or has symptoms of LE edema. 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 [name], [name] ([id number]) Genitourinary CKD Immunological Denies complaints or symptoms of Hives, Itching. Integumentary  (Skin) Complains or has symptoms of Breakdown. Musculoskeletal Denies complaints or symptoms of Muscle Pain, Muscle Weakness. Psychiatric Complains or has symptoms of Anxiety. Objective Constitutional sitting or standing blood pressure is within target range for patient.. pulse regular and within target range for patient.Marland Kitchen respirations regular, non- labored and within target range for patient.Marland Kitchen temperature within target range for patient.. Well-nourished and well-hydrated in no acute distress. Vitals Time Taken: 10:40 AM, Height: 66 in, Source: Stated, Temperature: 98.2 F, Pulse: 96 bpm, Respiratory Rate: 16 breaths/min, Blood Pressure: 132/82 mmHg. Eyes conjunctiva clear no eyelid edema noted. pupils equal round and reactive to light and accommodation. Ears, Nose, Mouth, and Throat no gross abnormality of ear auricles or external auditory canals. normal hearing noted during conversation. mucus membranes moist. Respiratory normal breathing without difficulty. Cardiovascular 2+ dorsalis pedis/posterior tibialis pulses. 1+ pitting edema of the bilateral lower extremities. Musculoskeletal Patient unable to walk without assistance. no significant deformity or arthritic changes, no loss or range of motion, no clubbing. General Notes: Upon inspection patient's wounds appear to be quite significant as far as the  sacral wound is concerned this is quite deep and though there is not direct bone exposure there is definite signs of muscle exposure at this point. With regard to the heel ulcer this is going require some debridement as well so did the sacral wound. The heel is not doing nearly as poorly good news is he does have good arterial flow and seems to have good pulses today as well which is great news he has some mild edema but nothing too much from the standpoint of swelling which is good news. Integumentary (Hair, Skin) Wound #1 status is Open. Original cause of wound was Gradually  Appeared. The date acquired was: 02/16/2021. The wound is located on the Right Calcaneus. The wound measures 1.8cm length x 1cm width x 0.1cm depth; 1.414cm^2 area and 0.141cm^3 volume. There is Fat Layer (Subcutaneous Tissue) exposed. There is no tunneling or undermining noted. There is a medium amount of serosanguineous drainage noted. There is large (67-100%) red, pink granulation within the wound bed. There is a small (1-33%) amount of necrotic tissue within the wound bed including Eschar and Adherent Slough. Wound #2 status is Open. Original cause of wound was Gradually Appeared. The date acquired was: 07/21/2018. The wound is located on the Sacrum. The wound measures 8.5cm length x 5cm width x 3.6cm depth; 33.379cm^2 area and 120.166cm^3 volume. There is muscle and Fat Layer (Subcutaneous Tissue) exposed. There is no tunneling noted, however, there is undermining starting at 9:00 and ending at 1:00 with a maximum distance of 2.7cm. There is a large amount of serosanguineous drainage noted. There is large (67-100%) red, pink granulation within the wound bed. There is a small (1-33%) amount of necrotic tissue within the wound bed. 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 There was a Excisional Skin/Subcutaneous Tissue Debridement with a total area of 2.5 sq cm performed by Tommie Sams., PA-C. With the following instrument(s): Curette to remove Viable and Non-Viable tissue/material. Material removed  includes Callus, Subcutaneous Tissue, Slough, and Biofilm after achieving pain control using Lidocaine. A time out was conducted at 11:30, prior to the start of the procedure. A Minimum amount of bleeding was controlled with Pressure. The procedure was tolerated well. Post Debridement Measurements: 2.5cm length x 1cm width x 0.1cm depth; 0.196cm^3 volume. Post debridement Stage noted as Category/Stage III. Character of Wound/Ulcer Post Debridement is improved. Severity of Tissue Post Debridement is: Fat layer exposed. Post procedure Diagnosis Wound #1: Same as Pre-Procedure Wound #2 Pre-procedure diagnosis of Wound #2 is a Pressure Ulcer located on the Sacrum . There was a Excisional Skin/Subcutaneous Tissue Debridement with a total area of 9 sq cm performed by Tommie Sams., PA-C. With the following instrument(s): Curette to remove Viable and Non-Viable tissue/material. Material removed includes Subcutaneous Tissue and Slough and after achieving pain control using Lidocaine. A time out was conducted at 11:30, prior to the start of the procedure. A Minimum amount of bleeding was controlled with Pressure. The procedure was tolerated well. Post Debridement Measurements: 8.5cm length x 5cm width x 3.6cm depth; 120.166cm^3 volume. Post debridement Stage noted as Category/Stage IV.  Character of Wound/Ulcer Post Debridement is improved. Post procedure Diagnosis Wound #2: Same as Pre-Procedure Plan Follow-up Appointments: Return Appointment in 2 weeks. Nurse Visit as needed Home Health: [facility]: - [facility] for wound care. May utilize formulary equivalent dressing for wound treatment orders unless otherwise specified. Home Health Nurse may visit PRN to address patient s wound care needs. Scheduled days for dressing changes to be completed; exception, patient has scheduled wound care visit that day. **Please direct any NON-WOUND related issues/requests for orders to  patient's Primary Care Physician. **If current dressing causes regression in wound condition, may D/C ordered dressing product/s and apply Normal Saline Moist Dressing daily until next [facility] or Other MD appointment. **Notify [facility] of regression in wound condition at [phone]. Bathing/ Shower/ Hygiene: May shower with wound dressing protected with water repellent cover or cast protector. No tub bath. Anesthetic (Use 'Patient Medications' Section for Anesthetic Order Entry): Lidocaine applied to wound bed Edema Control - Lymphedema / Segmental Compressive Device / Other: Elevate leg(s) parallel to the floor when sitting. DO YOUR BEST to sleep in the bed at night. DO NOT sleep in your recliner. Long hours of sitting in a recliner leads to swelling of the legs and/or potential wounds on your backside. Off-Loading: Gel wheelchair cushion Low air-loss mattress (Group 2) Turn and reposition every 2 hours Other: - PRAFO boot in bed keep pressure off of sacrum/gluteus and right heel- Additional Orders / Instructions: Follow Nutritious Diet and Increase Protein Intake WOUND #1: - Calcaneus Wound Laterality: Right Cleanser: Byram Ancillary Kit - 15 Day Supply 1 x Per Day/15 Days Discharge Instructions: Use supplies as instructed; Kit contains: (15) Saline Bullets; (15) 3x3 Gauze; 15 pr Gloves Cleanser: Normal Saline 1 x Per Day/15 Days Discharge Instructions: Wash your hands with soap and water. Remove old dressing, discard into plastic bag and place into trash. Cleanse the wound with Normal Saline prior to applying a clean dressing using gauze sponges, not tissues or cotton balls. Do not scrub or use excessive force. Pat dry using gauze sponges, not tissue or cotton balls. Cleanser: Soap and Water 1 x Per Day/15 Days Discharge Instructions: Gently cleanse wound with antibacterial soap, rinse and pat dry prior to dressing wounds Primary Dressing: Silvercel Small  2x2 (in/in) 1 x Per Day/15 Days Discharge Instructions: Apply Silvercel Small 2x2 (in/in) as instructed [name], [name] ([id number]) Secondary Dressing: Mepilex Border Flex, 4x4 (in/in) 1 x Per Day/15 Days Discharge Instructions: Apply to wound as directed. Do not cut. WOUND #2: - Sacrum Wound Laterality: Cleanser: Byram Ancillary Kit - 15 Day Supply 1 x Per Day/15 Days Discharge Instructions: Use supplies as instructed; Kit contains: (15) Saline Bullets; (15) 3x3 Gauze; 15 pr Gloves Cleanser: Dakin 16 (oz) 0.25 1 x Per Day/15 Days Discharge Instructions: Use as directed. Cleanser: Normal Saline 1 x Per QJF/35 Days Discharge Instructions: Wash your hands with soap and water. Remove old dressing, discard into plastic bag and place into trash. Cleanse the wound with Normal Saline prior to applying a clean dressing using gauze sponges, not tissues or cotton balls. Do not scrub or use excessive force. Pat dry using gauze sponges, not tissue or cotton balls. Cleanser: Soap and Water 1 x Per Day/15 Days Discharge Instructions: Gently cleanse wound with antibacterial soap, rinse and pat dry prior to dressing wounds Primary Dressing: Gauze 1 x Per Day/15 Days Discharge Instructions: Dakins gauze-As directed: dry, moistened with saline or moistened with Dakins Solution Secondary Dressing:  ABD Pad 5x9 (in/in) 1 x Per Day/15 Days Discharge Instructions: Cover with ABD pad Secured With: 35M Medipore H Soft Cloth Surgical Tape, 2x2 (in/yd) 1 x Per Day/15 Days 1. Based on what I am seeing currently I think that packing with a Dakin's moistened gauze for the sacral area is still the best way to go currently. 2. With regard to the heel ulcer my suggestion here is good to be that we actually see about utilizing a silver alginate dressing followed by bordered foam dressing to cover I think specially after clearing away the necrotic debris and callus today this should do quite well. 3. He needs to be in  his management appointments to work so that he can be offloaded on the air mattress. He does not need to be sitting in his recliner this is not good at all. 4. With regard to heel offloading he should be also using his Prevalon offloading boot he has this and therefore it is good to be a great option for him to try to keep things under control here. We will see patient back for reevaluation in 1 week here in the clinic. If anything worsens or changes patient will contact our office for additional recommendations. Electronic Signature(s) Signed: 05/29/2021 4:56:16 PM By: [name] PA-C Entered By: [name] on 05/29/2021 16:56:16 [name], [name] ([id number]) -------------------------------------------------------------------------------- ROS/PFSH Details Patient Name: [name] Date of Service: 05/29/2021 10:00 AM Medical Record Number: [id number] Patient Account Number: [id number] Date of Birth/Sex: 1956-01-14 (65 y.o. Male) Treating RN: Donnamarie Poag Primary Care Provider: [name] Other Clinician: Referring Provider: Anselm Pancoast Treating Provider/Extender: Skipper Cliche in Treatment: 0 Information Obtained From Patient Constitutional Symptoms (General Health) Complaints and Symptoms: Negative for: Fatigue; Fever; Chills; Marked Weight Change Eyes Complaints and Symptoms: Positive for: Glasses / Contacts Ear/Nose/Mouth/Throat Complaints and Symptoms: Negative for: Difficult clearing ears; Sinusitis Respiratory Complaints and Symptoms: Positive for: Shortness of Breath Medical History: Positive for: Sleep Apnea Cardiovascular Complaints and Symptoms: Positive for: LE edema Medical History: Positive for: Coronary Artery Disease; Deep Vein Thrombosis; Hypertension Endocrine Complaints and Symptoms: Negative for: Thyroid disease Medical History: Positive for: Type II Diabetes Time with diabetes: 5 years Treated with: Oral  agents Blood sugar tested every day: No Immunological Complaints and Symptoms: Negative for: Hives; Itching Integumentary (Skin) Complaints and Symptoms: Positive for: Breakdown Medical History: Positive for: History of pressure wounds Musculoskeletal [name], Yonas ([id number]) Complaints and Symptoms: Negative for: Muscle Pain; Muscle Weakness Psychiatric Complaints and Symptoms: Positive for: Anxiety Hematologic/Lymphatic Medical History: Positive for: Anemia Gastrointestinal Complaints and Symptoms: Review of System Notes: 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 Genitourinary Complaints and Symptoms: Review of System Notes: CKD Neurologic Medical History: Positive for: Neuropathy Negative for: Seizure Disorder Oncologic Immunizations Pneumococcal Vaccine: Received Pneumococcal Vaccination: No Implantable Devices None Family and Social History Never smoker; Marital Status - Married; Alcohol Use: Never; Drug Use: No History; Caffeine Use: Rarely; Financial Concerns: No; Food, Clothing or Shelter Needs: No; Support System Lacking: No; Transportation Concerns: No Electronic Signature(s) Signed: 05/29/2021 2:55:55 PM By: Donnamarie Poag Signed: 05/29/2021 4:57:43 PM By: [name] PA-C Entered By: Donnamarie Poag on 05/29/2021 10:55:11 Gonder, [name] ([id number]) -------------------------------------------------------------------------------- [place] Details Patient Name: [name] Date of Service: 05/29/2021 Medical Record Number: [id number] Patient Account Number: [id number] Date of Birth/Sex: August 29, 1955 (65 y.o. Male) Treating RN: Donnamarie Poag Primary Care Provider: [name] Other Clinician: Referring Provider: Anselm Pancoast Treating Provider/Extender: Skipper Cliche in Treatment: 0 Diagnosis Coding ICD-10 Codes Code Description [phone] Pressure ulcer of right  heel, stage 3 L89.154 Pressure ulcer of sacral region, stage 4 M62.81 Muscle weakness  (generalized) F31.9 Bipolar disorder, unspecified E11.622 Type 2 diabetes mellitus with other skin ulcer I10 Essential (primary) hypertension Z79.01 Long term (current) use of anticoagulants 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 Facility Procedures CPT4 Code: 33174099 Description: 99214 - WOUND CARE VISIT-LEV 4 EST PT Modifier: Quantity: 1 CPT4 Code: 27800447 Description: 15806 - DEB SUBQ TISSUE 20 SQ CM/< Modifier: Quantity: 1 CPT4 Code: Description: ICD-10 Diagnosis Description L89.613 Pressure ulcer of right heel, stage 3 L89.154 Pressure ulcer of sacral region, stage 4 Modifier: Quantity: Physician Procedures CPT4 Code: 3868548 Description: 83014 - WC PHYS LEVEL 4 - NEW PT Modifier: 25 Quantity: 1 CPT4 Code: Description: ICD-10 Diagnosis Description L89.613 Pressure ulcer of right heel, stage 3 L89.154 Pressure ulcer of sacral region, stage 4 M62.81 Muscle weakness (generalized) F31.9 Bipolar disorder, unspecified Modifier: Quantity: CPT4 Code: 1597331 Description: 25087 - WC PHYS SUBQ TISS 20 SQ CM Modifier: Quantity: 1 CPT4 Code: Description: ICD-10 Diagnosis Description V99.412 Pressure ulcer of right heel, stage 3 L89.154 Pressure ulcer of sacral region, stage 4 Modifier: Quantity: Electronic Signature(s) Signed: 05/29/2021 4:56:58 PM By: [name] PA-C Previous Signature: 05/29/2021 2:55:55 PM Version By: Donnamarie Poag Entered By: [name] on 05/29/2021 16:56:58

## 2021-05-29 NOTE — Progress Notes (Addendum)
[name], [name] ([id number]) Visit Report for 05/29/2021 Abuse/Suicide Risk Screen Details Patient Name: [name] Date of Service: 05/29/2021 10:00 AM Medical Record Number: [id number] Patient Account Number: [id number] Date of Birth/Sex: [birth year] (65 y.o. Male) Treating RN: Hansel Feinstein Primary Care Linnie Delgrande: Dione Booze Other Clinician: Referring Naomy Esham: Lattie Corns Treating Audrick Lamoureaux/Extender: Rowan Blase in Treatment: 0 Abuse/Suicide Risk Screen Items Answer ABUSE RISK SCREEN: Has anyone close to you tried to hurt or harm you recentlyo No Do you feel uncomfortable with anyone in your familyo No Has anyone forced you do things that you didnot want to doo No Electronic Signature(s) Signed: 05/29/2021 2:55:55 PM By: Hansel Feinstein Entered By: Hansel Feinstein on 05/29/2021 10:45:58 Rowland, [name] ([id number]) -------------------------------------------------------------------------------- Activities of Daily Living Details Patient Name: [name] Date of Service: 05/29/2021 10:00 AM Medical Record Number: [id number] Patient Account Number: [id number] Date of Birth/Sex: 1955-11-26 (65 y.o. Male) Treating RN: Hansel Feinstein Primary Care Iviana Blasingame: Dione Booze Other Clinician: Referring Ailee Pates: Lattie Corns Treating Briton Sellman/Extender: Rowan Blase in Treatment: 0 Activities of Daily Living Items Answer Activities of Daily Living (Please select one for each item) Drive Automobile Not Able Take Medications Need Assistance Use Telephone Need Assistance Care for Appearance Need Assistance Use Toilet Need Assistance Bath / Shower Need Assistance Dress Self Need Assistance Feed Self Completely Able Walk Need Assistance Get In / Out Bed Need Assistance Housework Not Able Prepare Meals Not Able Handle Money Not Able Shop for Self Not Able Electronic Signature(s) Signed: 05/29/2021 2:55:55 PM By: Hansel Feinstein Entered By:  Hansel Feinstein on 05/29/2021 10:43:57 [place]nethen, [name] ([id number]) -------------------------------------------------------------------------------- Education Screening Details Patient Name: [name] Date of Service: 05/29/2021 10:00 AM Medical Record Number: [id number] Patient Account Number: [id number] Date of Birth/Sex: [birth year] (65 y.o. Male) Treating RN: Hansel Feinstein Primary Care Versa Craton: Dione Booze Other Clinician: Referring Mansi Tokar: Lattie Corns Treating Rosalie Gelpi/Extender: Rowan Blase in Treatment: 0 Primary Learner Assessed: Patient Learning Preferences/Education Level/Primary Language Learning Preference: Explanation Highest Education Level: College or Above Preferred Language: English Cognitive Barrier Language Barrier: No Translator Needed: No Memory Deficit: Yes Emotional Barrier: No Cultural/Religious Beliefs Affecting Medical Care: No Physical Barrier Impaired Vision: No Impaired Hearing: No Decreased Hand dexterity: No Knowledge/Comprehension Knowledge Level: Low Comprehension Level: Medium Ability to understand written instructions: Low Ability to understand verbal instructions: Medium Motivation Anxiety Level: Calm Cooperation: Cooperative Education Importance: Acknowledges Need Interest in Health Problems: Uninterested Perception: Coherent Willingness to Engage in Self-Management Low Activities: Readiness to Engage in Self-Management Low Activities: Electronic Signature(s) Signed: 06/12/2021 9:55:01 AM By: Hansel Feinstein Previous Signature: 06/12/2021 9:54:11 AM Version By: Hansel Feinstein Previous Signature: 05/29/2021 2:55:55 PM Version By: Hansel Feinstein Entered By: Hansel Feinstein on 06/12/2021 09:55:01 [place], [name] ([id number]) -------------------------------------------------------------------------------- Fall Risk Assessment Details Patient Name: [name] Date of Service: 05/29/2021 10:00  AM Medical Record Number: [id number] Patient Account Number: [id number] Date of Birth/Sex: [birth year] (65 y.o. Male) Treating RN: Hansel Feinstein Primary Care Briton Sellman: Dione Booze Other Clinician: Referring Azura Tufaro: Lattie Corns Treating Jaquan Sadowsky/Extender: Rowan Blase in Treatment: 0 Fall Risk Assessment Items Have you had 2 or more falls in the last 12 monthso 0 No Have you had any fall that resulted in injury in the last 12 monthso 0 Yes FALLS RISK SCREEN History of falling - immediate or within 3 months 0 No Secondary diagnosis (Do you have 2 or more medical diagnoseso) 15 Yes Ambulatory aid None/bed rest/wheelchair/nurse 0 No Crutches/cane/walker 15 Yes Furniture 0 No Intravenous therapy Access/Saline/Heparin Lock 0 No Gait/Transferring Normal/ bed rest/ wheelchair  0 No Weak (short steps with or without shuffle, stooped but able to lift head while walking, may 0 No seek support from furniture) Impaired (short steps with shuffle, may have difficulty arising from chair, head down, impaired 20 Yes balance) Mental Status Oriented to own ability 0 No Electronic Signature(s) Signed: 05/29/2021 2:55:55 PM By: Hansel Feinstein Entered By: Hansel Feinstein on 05/29/2021 10:45:45 [place]aster, [name] ([id number]) -------------------------------------------------------------------------------- Foot Assessment Details Patient Name: [name] Date of Service: 05/29/2021 10:00 AM Medical Record Number: [id number] Patient Account Number: [id number] Date of Birth/Sex: [birth year] (65 y.o. Male) Treating RN: Hansel Feinstein Primary Care Kayron Kalmar: Dione Booze Other Clinician: Referring Artie Mcintyre: Lattie Corns Treating Haidee Stogsdill/Extender: Rowan Blase in Treatment: 0 Foot Assessment Items Site Locations + = Sensation present, - = Sensation absent, C = Callus, U = Ulcer R = Redness, W = Warmth, M = Maceration, PU = Pre-ulcerative lesion F = Fissure, S = Swelling, D =  Dryness Assessment Right: Left: Other Deformity: No No Prior Foot Ulcer: No No Prior Amputation: No No Charcot Joint: No No Ambulatory Status: Ambulatory With Help Assistance Device: Wheelchair Gait: Academic librarian Signature(s) Signed: 05/29/2021 2:55:55 PM By: Hansel Feinstein Entered By: Hansel Feinstein on 05/29/2021 10:50:38 [place], [name] ([id number]) -------------------------------------------------------------------------------- Nutrition Risk Screening Details Patient Name: [name] Date of Service: 05/29/2021 10:00 AM Medical Record Number: [id number] Patient Account Number: [id number] Date of Birth/Sex: Mar 24, 1956 (65 y.o. Male) Treating RN: Hansel Feinstein Primary Care Quint Chestnut: Dione Booze Other Clinician: Referring Johnnette Laux: Lattie Corns Treating Calayah Guadarrama/Extender: Rowan Blase in Treatment: 0 Height (in): 66 Weight (lbs): Body Mass Index (BMI): Nutrition Risk Screening Items Score Screening NUTRITION RISK SCREEN: I have an illness or condition that made me change the kind and/or amount of food I eat 0 No I eat fewer than two meals per day 0 No I eat few fruits and vegetables, or milk products 0 No I have three or more drinks of beer, liquor or wine almost every day 0 No I have tooth or mouth problems that make it hard for me to eat 0 No I don't always have enough money to buy the food I need 0 No I eat alone most of the time 0 No I take three or more different prescribed or over-the-counter drugs a day 1 Yes Without wanting to, I have lost or gained 10 pounds in the last six months 0 No I am not always physically able to shop, cook and/or feed myself 0 No Nutrition Protocols Good Risk Protocol Moderate Risk Protocol 0 Provide education on nutrition High Risk Proctocol Risk Level: Good Risk Score: 1 Electronic Signature(s) Signed: 05/29/2021 2:55:55 PM By: Hansel Feinstein Entered ByHansel Feinstein on 05/29/2021 10:45:11

## 2021-06-08 ENCOUNTER — Other Ambulatory Visit: Payer: Self-pay | Admitting: Psychiatry

## 2021-06-08 DIAGNOSIS — F411 Generalized anxiety disorder: Secondary | ICD-10-CM

## 2021-06-08 DIAGNOSIS — F3178 Bipolar disorder, in full remission, most recent episode mixed: Secondary | ICD-10-CM

## 2021-06-08 DIAGNOSIS — G4701 Insomnia due to medical condition: Secondary | ICD-10-CM

## 2021-06-12 ENCOUNTER — Ambulatory Visit: Payer: Medicare Other | Admitting: Physician Assistant

## 2021-06-22 ENCOUNTER — Other Ambulatory Visit: Payer: Self-pay

## 2021-06-22 ENCOUNTER — Emergency Department: Payer: Medicare Other

## 2021-06-22 ENCOUNTER — Inpatient Hospital Stay
Admission: EM | Admit: 2021-06-22 | Discharge: 2021-06-25 | DRG: 871 | Disposition: A | Discharge status: Home / Self Care | Payer: Medicare Other | Attending: Internal Medicine | Admitting: Internal Medicine

## 2021-06-22 DIAGNOSIS — Z86718 Personal history of other venous thrombosis and embolism: Secondary | ICD-10-CM | POA: Diagnosis not present

## 2021-06-22 DIAGNOSIS — E1169 Type 2 diabetes mellitus with other specified complication: Secondary | ICD-10-CM | POA: Diagnosis present

## 2021-06-22 DIAGNOSIS — Z933 Colostomy status: Secondary | ICD-10-CM

## 2021-06-22 DIAGNOSIS — Z960 Presence of urogenital implants: Secondary | ICD-10-CM | POA: Diagnosis present

## 2021-06-22 DIAGNOSIS — E785 Hyperlipidemia, unspecified: Secondary | ICD-10-CM | POA: Diagnosis present

## 2021-06-22 DIAGNOSIS — L89154 Pressure ulcer of sacral region, stage 4: Secondary | ICD-10-CM | POA: Diagnosis present

## 2021-06-22 DIAGNOSIS — S069XAA Unspecified intracranial injury with loss of consciousness status unknown, initial encounter: Secondary | ICD-10-CM | POA: Insufficient documentation

## 2021-06-22 DIAGNOSIS — G8220 Paraplegia, unspecified: Secondary | ICD-10-CM | POA: Diagnosis present

## 2021-06-22 DIAGNOSIS — L89613 Pressure ulcer of right heel, stage 3: Secondary | ICD-10-CM | POA: Diagnosis present

## 2021-06-22 DIAGNOSIS — M4628 Osteomyelitis of vertebra, sacral and sacrococcygeal region: Secondary | ICD-10-CM | POA: Diagnosis present

## 2021-06-22 DIAGNOSIS — L03312 Cellulitis of back [any part except buttock]: Secondary | ICD-10-CM | POA: Diagnosis present

## 2021-06-22 DIAGNOSIS — F319 Bipolar disorder, unspecified: Secondary | ICD-10-CM | POA: Diagnosis present

## 2021-06-22 DIAGNOSIS — A419 Sepsis, unspecified organism: Secondary | ICD-10-CM | POA: Diagnosis present

## 2021-06-22 DIAGNOSIS — E1122 Type 2 diabetes mellitus with diabetic chronic kidney disease: Secondary | ICD-10-CM | POA: Diagnosis present

## 2021-06-22 DIAGNOSIS — Z79899 Other long term (current) drug therapy: Secondary | ICD-10-CM | POA: Diagnosis not present

## 2021-06-22 DIAGNOSIS — Z7984 Long term (current) use of oral hypoglycemic drugs: Secondary | ICD-10-CM

## 2021-06-22 DIAGNOSIS — M8660 Other chronic osteomyelitis, unspecified site: Secondary | ICD-10-CM | POA: Diagnosis not present

## 2021-06-22 DIAGNOSIS — Z87820 Personal history of traumatic brain injury: Secondary | ICD-10-CM

## 2021-06-22 DIAGNOSIS — S069X0S Unspecified intracranial injury without loss of consciousness, sequela: Secondary | ICD-10-CM | POA: Diagnosis not present

## 2021-06-22 DIAGNOSIS — N319 Neuromuscular dysfunction of bladder, unspecified: Secondary | ICD-10-CM | POA: Diagnosis present

## 2021-06-22 DIAGNOSIS — R531 Weakness: Secondary | ICD-10-CM | POA: Diagnosis present

## 2021-06-22 DIAGNOSIS — I129 Hypertensive chronic kidney disease with stage 1 through stage 4 chronic kidney disease, or unspecified chronic kidney disease: Secondary | ICD-10-CM | POA: Diagnosis present

## 2021-06-22 DIAGNOSIS — N1832 Chronic kidney disease, stage 3b: Secondary | ICD-10-CM | POA: Diagnosis present

## 2021-06-22 DIAGNOSIS — E11621 Type 2 diabetes mellitus with foot ulcer: Secondary | ICD-10-CM | POA: Diagnosis present

## 2021-06-22 DIAGNOSIS — R6520 Severe sepsis without septic shock: Secondary | ICD-10-CM | POA: Diagnosis not present

## 2021-06-22 DIAGNOSIS — Z6835 Body mass index (BMI) 35.0-35.9, adult: Secondary | ICD-10-CM

## 2021-06-22 DIAGNOSIS — N189 Chronic kidney disease, unspecified: Secondary | ICD-10-CM | POA: Diagnosis present

## 2021-06-22 DIAGNOSIS — I82409 Acute embolism and thrombosis of unspecified deep veins of unspecified lower extremity: Secondary | ICD-10-CM | POA: Diagnosis not present

## 2021-06-22 DIAGNOSIS — F3178 Bipolar disorder, in full remission, most recent episode mixed: Secondary | ICD-10-CM

## 2021-06-22 DIAGNOSIS — E1129 Type 2 diabetes mellitus with other diabetic kidney complication: Secondary | ICD-10-CM | POA: Diagnosis not present

## 2021-06-22 DIAGNOSIS — L0390 Cellulitis, unspecified: Secondary | ICD-10-CM | POA: Diagnosis present

## 2021-06-22 DIAGNOSIS — Z7901 Long term (current) use of anticoagulants: Secondary | ICD-10-CM

## 2021-06-22 DIAGNOSIS — N179 Acute kidney failure, unspecified: Secondary | ICD-10-CM

## 2021-06-22 DIAGNOSIS — L03115 Cellulitis of right lower limb: Secondary | ICD-10-CM | POA: Diagnosis not present

## 2021-06-22 DIAGNOSIS — F411 Generalized anxiety disorder: Secondary | ICD-10-CM

## 2021-06-22 DIAGNOSIS — F09 Unspecified mental disorder due to known physiological condition: Secondary | ICD-10-CM

## 2021-06-22 DIAGNOSIS — Z872 Personal history of diseases of the skin and subcutaneous tissue: Secondary | ICD-10-CM

## 2021-06-22 DIAGNOSIS — E669 Obesity, unspecified: Secondary | ICD-10-CM | POA: Diagnosis present

## 2021-06-22 DIAGNOSIS — Z20822 Contact with and (suspected) exposure to covid-19: Secondary | ICD-10-CM | POA: Diagnosis present

## 2021-06-22 DIAGNOSIS — S069XAS Unspecified intracranial injury with loss of consciousness status unknown, sequela: Secondary | ICD-10-CM

## 2021-06-22 DIAGNOSIS — Z818 Family history of other mental and behavioral disorders: Secondary | ICD-10-CM | POA: Diagnosis not present

## 2021-06-22 DIAGNOSIS — Z978 Presence of other specified devices: Secondary | ICD-10-CM

## 2021-06-22 DIAGNOSIS — N390 Urinary tract infection, site not specified: Secondary | ICD-10-CM | POA: Diagnosis present

## 2021-06-22 DIAGNOSIS — G4701 Insomnia due to medical condition: Secondary | ICD-10-CM

## 2021-06-22 DIAGNOSIS — L97412 Non-pressure chronic ulcer of right heel and midfoot with fat layer exposed: Secondary | ICD-10-CM | POA: Diagnosis present

## 2021-06-22 LAB — COMPREHENSIVE METABOLIC PANEL
ALT: 14 U/L (ref 0–44)
AST: 17 U/L (ref 15–41)
Albumin: 2.8 g/dL — ABNORMAL LOW (ref 3.5–5.0)
Alkaline Phosphatase: 88 U/L (ref 38–126)
Anion gap: 6 (ref 5–15)
BUN: 24 mg/dL — ABNORMAL HIGH (ref 8–23)
CO2: 22 mmol/L (ref 22–32)
Calcium: 8.1 mg/dL — ABNORMAL LOW (ref 8.9–10.3)
Chloride: 99 mmol/L (ref 98–111)
Creatinine, Ser: 1.92 mg/dL — ABNORMAL HIGH (ref 0.61–1.24)
GFR, Estimated: 38 mL/min — ABNORMAL LOW (ref >60)
Glucose, Bld: 256 mg/dL — ABNORMAL HIGH (ref 70–99)
Potassium: 4.2 mmol/L (ref 3.5–5.1)
Sodium: 127 mmol/L — ABNORMAL LOW (ref 135–145)
Total Bilirubin: 0.5 mg/dL (ref 0.3–1.2)
Total Protein: 8.3 g/dL — ABNORMAL HIGH (ref 6.5–8.1)

## 2021-06-22 LAB — PROTIME-INR
INR: 1.6 — ABNORMAL HIGH (ref 0.8–1.2)
Prothrombin Time: 18.7 seconds — ABNORMAL HIGH (ref 11.4–15.2)

## 2021-06-22 LAB — APTT: aPTT: 34 seconds (ref 24–36)

## 2021-06-22 LAB — CBC WITH DIFFERENTIAL/PLATELET
Abs Immature Granulocytes: 0.07 10*3/uL (ref 0.00–0.07)
Basophils Absolute: 0 10*3/uL (ref 0.0–0.1)
Basophils Relative: 0 %
Eosinophils Absolute: 0.1 10*3/uL (ref 0.0–0.5)
Eosinophils Relative: 1 %
HCT: 28.8 % — ABNORMAL LOW (ref 39.0–52.0)
Hemoglobin: 9.1 g/dL — ABNORMAL LOW (ref 13.0–17.0)
Immature Granulocytes: 1 %
Lymphocytes Relative: 9 %
Lymphs Abs: 1 10*3/uL (ref 0.7–4.0)
MCH: 26.9 pg (ref 26.0–34.0)
MCHC: 31.6 g/dL (ref 30.0–36.0)
MCV: 85.2 fL (ref 80.0–100.0)
Monocytes Absolute: 0.8 10*3/uL (ref 0.1–1.0)
Monocytes Relative: 7 %
Neutro Abs: 8.7 10*3/uL — ABNORMAL HIGH (ref 1.7–7.7)
Neutrophils Relative %: 82 %
Platelets: 217 10*3/uL (ref 150–400)
RBC: 3.38 MIL/uL — ABNORMAL LOW (ref 4.22–5.81)
RDW: 17.2 % — ABNORMAL HIGH (ref 11.5–15.5)
WBC: 10.7 10*3/uL — ABNORMAL HIGH (ref 4.0–10.5)
nRBC: 0 % (ref 0.0–0.2)

## 2021-06-22 LAB — LACTIC ACID, PLASMA: Lactic Acid, Venous: 1.9 mmol/L (ref 0.5–1.9)

## 2021-06-22 LAB — URINALYSIS, COMPLETE (UACMP) WITH MICROSCOPIC
Bacteria, UA: NONE SEEN
Bilirubin Urine: NEGATIVE
Glucose, UA: NEGATIVE mg/dL
Ketones, ur: NEGATIVE mg/dL
Nitrite: NEGATIVE
Protein, ur: 100 mg/dL — AB
Specific Gravity, Urine: 1.025 (ref 1.005–1.030)
Squamous Epithelial / HPF: NONE SEEN (ref 0–5)
pH: 6 (ref 5.0–8.0)

## 2021-06-22 LAB — CBG MONITORING, ED
Glucose-Capillary: 161 mg/dL — ABNORMAL HIGH (ref 70–99)
Glucose-Capillary: 144 mg/dL — ABNORMAL HIGH (ref 70–99)
Glucose-Capillary: 105 mg/dL — ABNORMAL HIGH (ref 70–99)

## 2021-06-22 LAB — CORTISOL-AM, BLOOD: Cortisol - AM: 14.8 ug/dL (ref 6.7–22.6)

## 2021-06-22 LAB — RESP PANEL BY RT-PCR (FLU A&B, COVID) ARPGX2
Influenza A by PCR: NEGATIVE
Influenza B by PCR: NEGATIVE
SARS Coronavirus 2 by RT PCR: NEGATIVE

## 2021-06-22 LAB — PROCALCITONIN: Procalcitonin: 0.79 ng/mL

## 2021-06-22 LAB — GLUCOSE, CAPILLARY: Glucose-Capillary: 138 mg/dL — ABNORMAL HIGH (ref 70–99)

## 2021-06-22 MED ORDER — LACTATED RINGERS IV SOLN: INTRAVENOUS | Status: DC

## 2021-06-22 MED ORDER — SODIUM CHLORIDE 0.9 % IV BOLUS (SEPSIS)
1000.0000 mL | Freq: Once | INTRAVENOUS | Status: AC
  Administered 2021-06-22: 01:00:00 1000 mL via INTRAVENOUS

## 2021-06-22 MED ORDER — OXCARBAZEPINE 300 MG PO TABS
300.0000 mg | ORAL_TABLET | Freq: Two times a day (BID) | ORAL | Status: DC
  Administered 2021-06-22 – 2021-06-25 (×7): 300 mg via ORAL
  Filled 2021-06-22 (×8): qty 1

## 2021-06-22 MED ORDER — HYDROCODONE-ACETAMINOPHEN 5-325 MG PO TABS: 1.0000 | ORAL_TABLET | ORAL | Status: DC | PRN

## 2021-06-22 MED ORDER — MEMANTINE HCL 5 MG PO TABS
10.0000 mg | ORAL_TABLET | Freq: Two times a day (BID) | ORAL | Status: DC
  Administered 2021-06-22 – 2021-06-25 (×7): 10 mg via ORAL
  Filled 2021-06-22 (×7): qty 2

## 2021-06-22 MED ORDER — PIPERACILLIN-TAZOBACTAM 3.375 G IVPB
3.3750 g | Freq: Three times a day (TID) | INTRAVENOUS | Status: DC
  Administered 2021-06-22 – 2021-06-25 (×10): 3.375 g via INTRAVENOUS
  Filled 2021-06-22 (×10): qty 50

## 2021-06-22 MED ORDER — QUETIAPINE FUMARATE 25 MG PO TABS
100.0000 mg | ORAL_TABLET | Freq: Every day | ORAL | Status: DC
  Administered 2021-06-22 – 2021-06-24 (×3): 100 mg via ORAL
  Filled 2021-06-22 (×3): qty 4

## 2021-06-22 MED ORDER — APIXABAN 5 MG PO TABS
5.0000 mg | ORAL_TABLET | Freq: Two times a day (BID) | ORAL | Status: DC
  Administered 2021-06-22 – 2021-06-25 (×7): 5 mg via ORAL
  Filled 2021-06-22 (×7): qty 1

## 2021-06-22 MED ORDER — INSULIN ASPART 100 UNIT/ML IJ SOLN: 0.0000 [IU] | Freq: Every day | INTRAMUSCULAR | Status: DC

## 2021-06-22 MED ORDER — VANCOMYCIN HCL 750 MG/150ML IV SOLN
750.0000 mg | INTRAVENOUS | Status: DC
  Administered 2021-06-23 – 2021-06-24 (×2): 750 mg via INTRAVENOUS
  Filled 2021-06-22 (×2): qty 150

## 2021-06-22 MED ORDER — VITAMIN B-12 1000 MCG PO TABS
3000.0000 ug | ORAL_TABLET | Freq: Every day | ORAL | Status: DC
Start: 2021-06-22 — End: 2021-06-25
  Administered 2021-06-22 – 2021-06-25 (×4): 3000 ug via ORAL
  Filled 2021-06-22 (×5): qty 3

## 2021-06-22 MED ORDER — ONDANSETRON HCL 4 MG/2ML IJ SOLN: 4.0000 mg | Freq: Four times a day (QID) | INTRAMUSCULAR | Status: DC | PRN

## 2021-06-22 MED ORDER — VANCOMYCIN HCL 750 MG/150ML IV SOLN: 750.0000 mg | INTRAVENOUS | Status: DC

## 2021-06-22 MED ORDER — INSULIN ASPART 100 UNIT/ML IJ SOLN
0.0000 [IU] | Freq: Three times a day (TID) | INTRAMUSCULAR | Status: DC
  Administered 2021-06-22: 09:00:00 4 [IU] via SUBCUTANEOUS
  Administered 2021-06-22: 14:00:00 3 [IU] via SUBCUTANEOUS
  Administered 2021-06-23 – 2021-06-25 (×4): 4 [IU] via SUBCUTANEOUS
  Administered 2021-06-25: 09:00:00 3 [IU] via SUBCUTANEOUS
  Filled 2021-06-22 (×7): qty 1

## 2021-06-22 MED ORDER — SODIUM CHLORIDE 0.9 % IV SOLN
2.0000 g | Freq: Once | INTRAVENOUS | Status: AC
  Administered 2021-06-22: 01:00:00 2 g via INTRAVENOUS
  Filled 2021-06-22: qty 2

## 2021-06-22 MED ORDER — METRONIDAZOLE 500 MG/100ML IV SOLN
500.0000 mg | Freq: Once | INTRAVENOUS | Status: AC
  Administered 2021-06-22: 02:00:00 500 mg via INTRAVENOUS
  Filled 2021-06-22: qty 100

## 2021-06-22 MED ORDER — ACETAMINOPHEN 325 MG PO TABS: 650.0000 mg | ORAL_TABLET | Freq: Four times a day (QID) | ORAL | Status: DC | PRN

## 2021-06-22 MED ORDER — VANCOMYCIN HCL 2000 MG/400ML IV SOLN
2000.0000 mg | Freq: Once | INTRAVENOUS | Status: AC
Start: 2021-06-22 — End: 2021-06-22
  Administered 2021-06-22: 02:00:00 2000 mg via INTRAVENOUS
  Filled 2021-06-22: qty 400

## 2021-06-22 MED ORDER — QUETIAPINE FUMARATE 25 MG PO TABS: 50.0000 mg | ORAL_TABLET | Freq: Two times a day (BID) | ORAL | Status: DC | PRN

## 2021-06-22 MED ORDER — ONDANSETRON HCL 4 MG PO TABS: 4.0000 mg | ORAL_TABLET | Freq: Four times a day (QID) | ORAL | Status: DC | PRN

## 2021-06-22 MED ORDER — ACETAMINOPHEN 650 MG RE SUPP: 650.0000 mg | Freq: Four times a day (QID) | RECTAL | Status: DC | PRN

## 2021-06-22 MED ORDER — IOHEXOL 300 MG/ML  SOLN
75.0000 mL | Freq: Once | INTRAMUSCULAR | Status: AC | PRN
  Administered 2021-06-22: 02:00:00 75 mL via INTRAVENOUS

## 2021-06-22 MED ORDER — VANCOMYCIN HCL IN DEXTROSE 1-5 GM/200ML-% IV SOLN: 1000.0000 mg | Freq: Once | INTRAVENOUS | Status: DC

## 2021-06-22 NOTE — Progress Notes (Addendum)
Patient ID: [name], male   DOB: January 05, 1956, 65 y.o.   MRN: [id number] [facility] PROGRESS NOTE  [name] [id number]:[id number] DOB: Jan 16, 1956 DOA: 06/22/2021 PCP: [name] Booze, MD  HPI/Subjective: Patient admitted with sepsis and cellulitis.  Feels okay.  Offers no complaints.  Objective: Vitals:   06/22/21 0900 06/22/21 1000  BP: 129/72 119/72  Pulse: 94 84  Resp: 19 17  Temp:    SpO2: 100% 98%    Intake/Output Summary (Last 24 hours) at 06/22/2021 1410 Last data filed at 06/22/2021 0654 Gross per 24 hour  Intake 400.31 ml  Output --  Net 400.31 ml   Filed Weights   06/22/21 0119  Weight: 98.9 kg    ROS: Review of Systems  Respiratory:  Negative for shortness of breath.   Cardiovascular:  Negative for chest pain.  Gastrointestinal:  Negative for abdominal pain, nausea and vomiting.  Exam: Physical Exam HENT:     Head: Normocephalic.     Mouth/Throat:     Pharynx: No oropharyngeal exudate.  Eyes:     General: Lids are normal.     Conjunctiva/sclera: Conjunctivae normal.  Cardiovascular:     Rate and Rhythm: Normal rate and regular rhythm.     Heart sounds: Normal heart sounds, S1 normal and S2 normal.  Pulmonary:     Breath sounds: No decreased breath sounds, wheezing, rhonchi or rales.  Abdominal:     Palpations: Abdomen is soft.     Tenderness: There is no abdominal tenderness.  Musculoskeletal:     Right lower leg: Swelling present.     Left lower leg: Swelling present.  Skin:    General: Skin is warm.     Comments: Slight erythema around the hips  Neurological:     Mental Status: He is alert and oriented to person, place, and time.       Scheduled Meds:  apixaban  5 mg Oral BID   insulin aspart  0-20 Units Subcutaneous TID WC   insulin aspart  0-5 Units Subcutaneous QHS   memantine  10 mg Oral BID   Oxcarbazepine  300 mg Oral BID   QUEtiapine  100 mg Oral QHS   vitamin B-12  3,000 mcg Oral Daily    Continuous Infusions:  lactated ringers 50 mL/hr at 06/22/21 1327   piperacillin-tazobactam (ZOSYN)  IV Stopped (06/22/21 1059)   [START ON 06/23/2021] vancomycin      Assessment/Plan:  Clinical sepsis, present on admission.  Patient had fever of 100.2, leukocytosis with white blood cell count of 15.  Cellulitis.  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  We will get wound culture.  Follow-up blood cultures and urine cultures.  Foley catheter changed.  Patient on empiric vancomycin and Zosyn for now.  Infectious disease consultation. Acute kidney injury on chronic kidney disease stage IIIb.  Creatinine 1.92 on presentation and baseline creatinine around 1.69.  Gentle IV fluids. Recurrent DVT on Eliquis Traumatic brain injury and bipolar disorder on oxcarbazepine and Seroquel Type 2 diabetes.  Last hemoglobin A1c 6.9 patient on sliding scale insulin. As per wife patient chokes on cereal but able to eat otherwise.  We will get swallow evaluation.       Code Status:     Code Status Orders  (From admission, onward)           Start     Ordered   06/22/21 0402  Full code  Continuous        06/22/21 0402  Code Status History     Date Active Date Inactive Code Status Order ID Comments User Context   02/23/2021 1651 03/03/2021 2300 Full Code [id number]  [name] Dusky, MD ED   03/18/2020 1553 03/22/2020 2055 Full Code [id number]  [name] Bullock, MD ED   01/16/2020 1840 01/19/2020 1945 Full Code [id number]  [name] Costa, MD Inpatient   01/02/2016 2050 01/04/2016 2147 Full Code [id number]  [name] Hire, MD Inpatient      Family Communication: Spoke with wife on the phone Disposition Plan: Status is: Inpatient  Consultants: Infectious disease  Antibiotics: Currently on vancomycin and Zosyn  Case discussed with ID and nursing staff.  [name]  [facility] [employer]

## 2021-06-22 NOTE — Progress Notes (Signed)
PHARMACY -  BRIEF ANTIBIOTIC NOTE   Pharmacy has received consult(s) for cefepime from an ED provider.  The patient's profile has been reviewed for ht/wt/allergies/indication/available labs.    One time order(s) placed for Cefepime 2 gm IV X 1   Further antibiotics/pharmacy consults should be ordered by admitting physician if indicated.                       Thank you, Annalia Metzger D 06/22/2021  1:08 AM

## 2021-06-22 NOTE — ED Provider Notes (Signed)
[facility] Emergency Department Provider Note  ____________________________________________  Time seen: Approximately 12:57 AM  I have reviewed the triage vital signs and the nursing notes.   HISTORY  Chief Complaint Weakness Level 5 Caveat: Portions of the History and Physical including HPI and review of systems are unable to be completely obtained due to patient being a poor historian    HPI [name] is a 65 y.o. male with a history of bipolar disorder, diabetes, hypertension, DVT, obesity who comes the ED due to weakness, malaise and decreased oral intake for the last 2 or 3 days.  Patient denies any focal pain.  Patient has colostomy, chronic Foley catheter.  He does not recall how long his current Foley catheter has been in place since the last change  Past Medical History:  Diagnosis Date   Bipolar disorder ([place])    Diabetes mellitus without complication ([place])    History of blood clots    Hypertension    TBI (traumatic brain injury)      Patient Active Problem List   Diagnosis Date Noted   Diabetic foot ulcer ([place]) 04/14/2021   UTI (urinary tract infection) 02/23/2021   Osteomyelitis of vertebra, sacral and sacrococcygeal region ([place]) 10/18/2020   Hyponatremia 07/07/2020   Encephalopathy 07/07/2020   Chronic multifocal osteomyelitis of right foot ([place]) 07/07/2020   Anemia 07/07/2020   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[place]) 06/01/2020   Osteomyelitis of sacrum ([place]) 04/13/2020   Urinary retention 03/19/2020   History of DVT (deep vein thrombosis) 03/19/2020   Pressure injury of sacral region, stage 4 ([place]) 03/19/2020   Severe sepsis with septic shock ([place]) 03/18/2020   Obesity, Class III, BMI 40-49.9 (morbid obesity) ([place]) 03/18/2020   Cellulitis 03/18/2020   Acute cystitis without hematuria    Chronic kidney disease (CKD), stage III (moderate) ([place]) 01/20/2020   Pressure injury  of skin 01/17/2020   Sepsis ([place]) 01/16/2020   Cellulitis of sacral region 01/16/2020   Acute kidney injury superimposed on chronic kidney disease ([place]) 01/16/2020   Bipolar disorder, in full remission, most recent episode mixed ([place]) 12/29/2019   High risk medication use 10/25/2019   Noncompliance with treatment plan 10/25/2019   Bipolar I disorder, most recent episode mixed ([place]) 01/07/2019   GAD (generalized anxiety disorder) 01/07/2019   Insomnia due to medical condition 01/07/2019   DVT, recurrent, lower extremity, acute ([place]) 03/06/2016   Bipolar disorder ([place]) 01/03/2016   Lithium toxicity 01/02/2016   Chronic anticoagulation 07/06/2014   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/06/2014   Hyperlipidemia 07/06/2014   Hypertension 07/06/2014   Leg swelling 07/06/2014   Obesity, Class II, BMI 35-39.9, with comorbidity 07/06/2014   On medication for venous thromboembolism 07/06/2014   Sleep apnea 07/06/2014   Type II diabetes mellitus with renal manifestations ([place]) 07/06/2014   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 Authorizing Provider  amLODipine (NORVASC) 5 MG tablet Take 1 tablet (5 mg total) by mouth daily. 04/16/21 05/16/21  Pokhrel, Corrie Mckusick, MD  ELIQUIS 5 MG TABS tablet Take 5 mg by mouth 2 (two) times daily.  11/17/18  [provider]  glipiZIDE (GLUCOTROL XL) 10 MG 24 hr tablet Take 10 mg by mouth daily with breakfast.  09/26/15   [provider]  memantine (NAMENDA) 10 MG tablet Take 10 mg by mouth 2 (two) times daily.    [provider]  Omega-3 1000 MG CAPS Take by mouth.    [provider]  Oxcarbazepine  (TRILEPTAL) 300 MG tablet Take 1 tablet (300 mg total) by mouth 2 (two) times daily. 01/11/21   Ursula Alert, MD  QUEtiapine (SEROQUEL) 100 MG tablet Take 1 tablet (100 mg total) by mouth at bedtime. 12/05/20   Ursula Alert, MD  QUEtiapine (SEROQUEL) 50 MG tablet TAKE 1 TABLET BY MOUTH TWICE DAILY AS NEEDED FOR  SEVERE  ANXIETY  AND  AGITATION 06/08/21   Ursula Alert, MD  rosuvastatin (CRESTOR) 20 MG tablet Take 20 mg by mouth daily.  08/29/17   [provider]  vitamin B-12 (CYANOCOBALAMIN) 1000 MCG tablet Take 3,000 mcg by mouth daily.    [provider]     Allergies Patient has no known allergies.   Family History  Problem Relation Age of Onset   Depression Father    Deep vein thrombosis Father     Social History Social History   Tobacco Use   Smoking status: Never   Smokeless tobacco: Never  Vaping Use   Vaping Use: Never used  Substance Use Topics   Alcohol use: Yes   Drug use: No    Review of Systems  Constitutional:   Positive fever.  ENT:   No sore throat. No rhinorrhea. Cardiovascular:   No chest pain or syncope. Respiratory:   No dyspnea or cough. Gastrointestinal:   Negative for abdominal pain, vomiting and diarrhea.  Musculoskeletal:   Chronic bilateral lower leg swelling All other systems reviewed and are negative except as documented above in ROS and HPI.  ____________________________________________   PHYSICAL EXAM:  VITAL SIGNS: ED Triage Vitals  Enc Vitals Group     BP      Pulse      Resp      Temp      Temp src      SpO2      Weight      Height      Head Circumference      Peak Flow      Pain Score      Pain Loc      Pain Edu?      Excl. in [place]?     Vital signs reviewed, nursing assessments reviewed.   Constitutional:   Alert and oriented to person and place.  Asks repetitive questions. Non-toxic appearance. Eyes:   Conjunctivae are normal. EOMI. PERRL. ENT      Head:   Normocephalic and atraumatic.       Nose:   Normal      Mouth/Throat:   Dry mucous membranes.      Neck:   No meningismus. Full ROM. Hematological/Lymphatic/Immunilogical:   No cervical lymphadenopathy. Cardiovascular:   Tachycardia heart rate 105. Symmetric bilateral radial and DP pulses.  No murmurs. Cap refill less than 2 seconds. Respiratory:   Normal respiratory effort without tachypnea/retractions. Breath sounds are clear and equal bilaterally. No wheezes/rales/rhonchi. Gastrointestinal:   Soft and nontender.  Mildly distended. There is no CVA tenderness.  No rebound, rigidity, or guarding. Genitourinary:   deferred Musculoskeletal:   Normal range of motion in all extremities. No joint effusions.  No lower extremity tenderness.  No  edema. Neurologic:   Normal speech Motor grossly intact.  Skin:    Skin is warm, dry and intact.  Large cellulitis rash extending bilaterally over the lower back and wrapping around to anterior abdomen no petechiae, purpura, or bullae.  ____________________________________________    LABS (pertinent positives/negatives) (all labs ordered are listed, but only abnormal results are displayed) Labs Reviewed  COMPREHENSIVE METABOLIC PANEL - Abnormal; Notable for the following components:      Result Value   Sodium 127 (*)    Glucose, Bld 256 (*)    BUN 24 (*)    Creatinine, Ser 1.92 (*)    Calcium 8.1 (*)    Total Protein 8.3 (*)    Albumin 2.8 (*)    GFR, Estimated 38 (*)    All other components within normal limits  CBC WITH DIFFERENTIAL/PLATELET - Abnormal; Notable for the following components:   WBC 10.7 (*)    RBC 3.38 (*)    Hemoglobin 9.1 (*)    HCT 28.8 (*)    RDW 17.2 (*)    Neutro Abs 8.7 (*)    All other components within normal limits  PROTIME-INR - Abnormal; Notable for the following components:   Prothrombin Time 18.7 (*)    INR 1.6 (*)    All other components within normal limits  URINALYSIS, COMPLETE (UACMP) WITH MICROSCOPIC - Abnormal; Notable for the following  components:   APPearance CLOUDY (*)    Hgb urine dipstick LARGE (*)    Protein, ur 100 (*)    Leukocytes,Ua LARGE (*)    All other components within normal limits  RESP PANEL BY RT-PCR (FLU A&B, COVID) ARPGX2  CULTURE, BLOOD (ROUTINE X 2)  CULTURE, BLOOD (ROUTINE X 2)  URINE CULTURE  LACTIC ACID, PLASMA  APTT   ____________________________________________   EKG    ____________________________________________    RADIOLOGY  CT ABDOMEN PELVIS W CONTRAST  Result Date: 06/22/2021 CLINICAL DATA:  Abdominal pain. EXAM: CT ABDOMEN AND PELVIS WITH CONTRAST TECHNIQUE: Multidetector CT imaging of the abdomen and pelvis was performed using the standard protocol following bolus administration of intravenous contrast. CONTRAST:  79mL OMNIPAQUE IOHEXOL 300 MG/ML  SOLN COMPARISON:  None. FINDINGS: Lower chest: No acute abnormality. Hepatobiliary: A punctate focus of parenchymal low attenuation is seen within the medial aspect of the right lobe of the liver. This is too small to characterize by CT examination. No gallstones, gallbladder wall thickening, or biliary dilatation. Pancreas: Unremarkable. No pancreatic ductal dilatation or surrounding inflammatory changes. Spleen: Normal in size without focal abnormality. Adrenals/Urinary Tract: Adrenal glands are unremarkable. Kidneys are normal, without renal calculi, focal lesion, or hydronephrosis. A Foley catheter is seen within the urinary bladder. Mild, diffuse, urinary bladder wall thickening is also noted. Stomach/Bowel: Stomach is within normal limits. Appendix appears normal. No evidence of bowel wall thickening, distention, or inflammatory changes. Vascular/Lymphatic: Aortic atherosclerosis. No enlarged abdominal or pelvic lymph nodes. Reproductive: Prostate is unremarkable. Other: A left lower quadrant ostomy site is seen. No abdominopelvic ascites. Musculoskeletal: A large decubitus ulcer, with associated soft tissue thickening, is seen along the  posterior aspect of the mid to lower coccyx. This measures approximately 4.1 cm x 0.8 cm. Associated cortical erosion of indeterminate age is noted along the posterior aspect of the coccyx. Degenerative changes are noted throughout the lumbar spine. IMPRESSION: 1. Large decubitus ulcer, with associated soft tissue thickening, along the posterior aspect of the mid to lower coccyx with additional findings consistent with osteomyelitis of indeterminate age. MRI correlation is  recommended. 2. Mild, diffuse, urinary bladder wall thickening, which may represent cystitis. 3. Left lower quadrant ostomy site. 4. Aortic atherosclerosis. Aortic Atherosclerosis (ICD10-I70.0). Electronically Signed   By: Virgina Norfolk M.D.   On: 06/22/2021 02:44   DG Chest Port 1 View  Result Date: 06/22/2021 CLINICAL DATA:  Altered mental status. EXAM: PORTABLE CHEST 1 VIEW COMPARISON:  February 23, 2021 FINDINGS: The heart size and mediastinal contours are within normal limits. Both lungs are clear. Degenerative changes are seen involving the right shoulder and thoracic spine. IMPRESSION: No active disease. Electronically Signed   By: Virgina Norfolk M.D.   On: 06/22/2021 01:24    ____________________________________________   PROCEDURES .Critical Care Performed by: Carrie Mew, MD Authorized by: Carrie Mew, MD   Critical care provider statement:    Critical care time (minutes):  35   Critical care time was exclusive of:  Separately billable procedures and treating other patients   Critical care was necessary to treat or prevent imminent or life-threatening deterioration of the following conditions:  Sepsis   Critical care was time spent personally by me on the following activities:  Development of treatment plan with patient or surrogate, discussions with consultants, evaluation of patient's response to treatment, examination of patient, obtaining history from patient or surrogate, ordering and performing  treatments and interventions, ordering and review of laboratory studies, ordering and review of radiographic studies, pulse oximetry, re-evaluation of patient's condition and review of old charts  ____________________________________________  DIFFERENTIAL DIAGNOSIS   Dehydration, electrolyte abnormality, sepsis related to cellulitis versus catheter associated UTI, bowel obstruction, intra-abdominal abscess, bowel perforation  CLINICAL IMPRESSION / ASSESSMENT AND PLAN / ED COURSE  Medications ordered in the ED: Medications  lactated ringers infusion ( Intravenous New Bag/Given 06/22/21 0118)  vancomycin (VANCOREADY) IVPB 2000 mg/400 mL (2,000 mg Intravenous New Bag/Given 06/22/21 0156)  sodium chloride 0.9 % bolus 1,000 mL (0 mLs Intravenous Stopped 06/22/21 0229)  ceFEPIme (MAXIPIME) 2 g in sodium chloride 0.9 % 100 mL IVPB (0 g Intravenous Stopped 06/22/21 0155)  metroNIDAZOLE (FLAGYL) IVPB 500 mg (0 mg Intravenous Stopped 06/22/21 0229)  iohexol (OMNIPAQUE) 300 MG/ML solution 75 mL (75 mLs Intravenous Contrast Given 06/22/21 0211)    Pertinent labs & imaging results that were available during my care of the patient were reviewed by me and considered in my medical decision making (see chart for details).  [name] was evaluated in Emergency Department on 06/22/2021 for the symptoms described in the history of present illness. He was evaluated in the context of the global COVID-19 pandemic, which necessitated consideration that the patient might be at risk for infection with the SARS-CoV-2 virus that causes COVID-19. Institutional protocols and algorithms that pertain to the evaluation of patients at risk for COVID-19 are in a state of rapid change based on information released by regulatory bodies including the CDC and federal and state organizations. These policies and algorithms were followed during the patient's care in the ED.   Patient presents with fever tachycardia,  suspected UTI versus cellulitis versus intra-abdominal pathology.  Will initiate sepsis protocol, give empiric cefepime pending labs, CT abdomen pelvis.  Clinical Course as of 06/22/21 0305  Fri Jun 22, 2021  0118 Chest x-ray viewed and interpreted by me, appears unremarkable. [PS]    Clinical Course User Index [PS] Carrie Mew, MD     ----------------------------------------- 3:06 AM on 06/22/2021 ----------------------------------------- CT shows sacral decubitus ulcer with signs of underlying osteomyelitis.  Ulcer work-up is consistent with cystitis.  In addition  to cefepime, Flagyl and vancomycin were added on due to his soft tissue infection.  No signs of shock.  ____________________________________________   FINAL CLINICAL IMPRESSION(S) / ED DIAGNOSES    Final diagnoses:  Sacral osteomyelitis ([place])  Cellulitis of lower back     ED Discharge Orders     None       Portions of this note were generated with dragon dictation software. Dictation errors may occur despite best attempts at proofreading.    Carrie Mew, MD 06/22/21 [phone]

## 2021-06-22 NOTE — ED Triage Notes (Signed)
PATIENT came from home wife reported that pt had become increasingly altered mentally. Pt able to tell this nurse day time year and month and place at this time. Pt does have cellulitis to the back and abdomen, warm to touch skin in tact.  Pt VSS. Temp 100.2 oraly.  Pt received from ems. Cbg 342 from ems.

## 2021-06-22 NOTE — ED Notes (Signed)
Foley and ostomy bag emptied. Pt assisted to seated position to eat breakfast.  Pt provided phone to call family

## 2021-06-22 NOTE — H&P (Addendum)
History and Physical    [name] [id number] DOB: [birth year] DOA: 06/22/2021  PCP: [name], MD   Patient coming from: home  I have personally briefly reviewed patient's relevant medical records in [place]  Chief Complaint: malaise, fever  HPI: [name] is a 65 y.o. 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  He denies fever or chills and denies cough, shortness of breath.  Denies nausea or vomiting or abdominal pain or diarrhea.  History is limited likely due to history of TBI.  His wife was concerned that he appeared altered from baseline and was concerned about a rash on his back and lower abdomen.  ED course: On arrival temperature 100.2, soft blood pressure of 99/59 improving to 116/69 with IV fluids, pulse in the 80s. Blood work: WBC 10.7, hemoglobin 9.1 Sodium 127, creatinine 1.19 above baseline of 1.5.  Blood glucose 256 Lactic acid 1.9 Urinalysis with large leukocyte esterase  Imaging: CT abdomen and pelvis with contrast showing a large decubitus ulcer mid to lower coccyx with additional findings consistent with osteomyelitis and possible cystitis CXR with no acute disease  Patient started on cefepime vancomycin and Flagyl.  Hospitalist consulted for admission.   Review of Systems: As per HPI otherwise all other systems on review of systems negative.   Assessment/Plan    Cellulitis   Sepsis (HCC)   Pressure injury of sacral region, stage 4 (HCC)   Osteomyelitis of vertebra, sacral and sacrococcygeal region  -IV  Zosyn and Vanco - Wound care consult - Consider surgical consult in the a.m. 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 This is judged to be reasonable and necessary in order to provide the required intensity of service to ensure the patient's safety given the presenting symptoms, physical exam findings, and initial radiographic and laboratory data in the context of their  Comorbid conditions.   Patient requires inpatient status due  to high intensity of service, high risk for further deterioration and high frequency of surveillance required.   I certify that at the point of admission it is my clinical judgment that the patient will require inpatient  hospital care spanning beyond 2 midnights     Physical Exam: Vitals:   06/22/21 0120 06/22/21 0121 06/22/21 0130 06/22/21 0200  BP:   (!) 99/59 116/69  Pulse: 89 82 87 84  Resp: 15 14 17 19   Temp:      TempSrc:      SpO2: 97% 99% 97% 99%  Weight:      Height:       Constitutional: Lethargic, oriented x 2. Not in any apparent distress HEENT:      Head: Normocephalic and atraumatic.         Eyes: PERLA, EOMI, Conjunctivae are normal. Sclera is non-icteric.       Mouth/Throat: Mucous membranes are moist.       Neck: Supple with no signs of meningismus. Cardiovascular: Regular rate and rhythm. No murmurs, gallops, or rubs. 2+ symmetrical distal pulses are present . No JVD. No  LE edema Respiratory: Respiratory effort normal .Lungs sounds clear bilaterally. No wheezes, crackles, or rhonchi.  Gastrointestinal: Soft, non tender, non distended. Positive bowel sounds.  Colostomy left lower quadrant Genitourinary: No CVA tenderness.  Foley catheter with clear urine Musculoskeletal: Nontender with normal range of motion in all extremities. No cyanosis, or erythema of extremities. Neurologic:  Face is symmetric. Moving all extremities. No gross focal neurologic deficits . Skin: Redness erythema and warmth right lower abdomen extending posteriorly to gluteal area. Psychiatric: Mood and affect are appropriate     Past Medical History:  Diagnosis Date   Bipolar disorder (HCC)    Diabetes mellitus without complication (HCC)    History of blood clots    Hypertension    TBI (traumatic brain injury)     Past Surgical History:  Procedure Laterality Date   BACK SURGERY     CARPAL TUNNEL RELEASE Bilateral    COLON SURGERY     COLOSTOMY     TONSILLECTOMY       reports that he has never smoked. He has never used smokeless tobacco. He reports current alcohol use. He reports that he does not use drugs.  No Known Allergies  Family History  Problem Relation Age of Onset   Depression  Father    Deep vein thrombosis Father       Prior to Admission medications   Medication Sig Start Date End Date Taking? Authorizing Provider  amLODipine (NORVASC) 5 MG tablet Take 1 tablet (5 mg total) by mouth daily. 04/16/21 05/16/21  Pokhrel, 05/18/21, MD  ELIQUIS 5 MG TABS tablet Take 5 mg by mouth 2 (two) times daily.  11/17/18   [provider]  glipiZIDE (GLUCOTROL XL) 10 MG 24 hr tablet Take 10 mg by mouth daily with breakfast.  09/26/15   [provider]  memantine (NAMENDA) 10 MG tablet Take 10 mg by mouth 2 (two) times daily.    [provider]  Omega-3 1000 MG CAPS Take by mouth.    [provider]  Oxcarbazepine (TRILEPTAL) 300 MG tablet Take 1 tablet (300 mg total) by mouth 2 (two) times daily. 01/11/21   01/13/21, MD  QUEtiapine (SEROQUEL) 100 MG tablet Take 1 tablet (100 mg total) by mouth at bedtime. 12/05/20   02/04/21, MD  QUEtiapine (SEROQUEL) 50 MG tablet TAKE 1 TABLET BY MOUTH TWICE DAILY  AS NEEDED FOR  SEVERE  ANXIETY  AND  AGITATION 06/08/21   Ursula Alert, MD  rosuvastatin (CRESTOR) 20 MG tablet Take 20 mg by mouth daily.  08/29/17   [provider]  vitamin B-12 (CYANOCOBALAMIN) 1000 MCG tablet Take 3,000 mcg by mouth daily.    [provider]      Labs on Admission: I have personally reviewed following labs and imaging studies  CBC: Recent Labs  Lab 06/22/21 0046  WBC 10.7*  NEUTROABS 8.7*  HGB 9.1*  HCT 28.8*  MCV 85.2  PLT [id number]   Basic Metabolic Panel: Recent Labs  Lab 06/22/21 0046  NA 127*  K 4.2  CL 99  CO2 22  GLUCOSE 256*  BUN 24*  CREATININE 1.92*  CALCIUM 8.1*   GFR: Estimated Creatinine Clearance: 41.6 mL/min (A) (by C-G formula based on SCr of 1.92 mg/dL (H)). Liver Function Tests: Recent Labs  Lab 06/22/21 0046  AST 17  ALT 14  ALKPHOS 88  BILITOT 0.5  PROT 8.3*  ALBUMIN 2.8*   No results for input(s): LIPASE, AMYLASE in the last 168 hours. No results for  input(s): AMMONIA in the last 168 hours. Coagulation Profile: Recent Labs  Lab 06/22/21 0046  INR 1.6*   Cardiac Enzymes: No results for input(s): CKTOTAL, CKMB, CKMBINDEX, TROPONINI in the last 168 hours. BNP (last 3 results) No results for input(s): PROBNP in the last 8760 hours. HbA1C: No results for input(s): HGBA1C in the last 72 hours. CBG: No results for input(s): GLUCAP in the last 168 hours. Lipid Profile: No results for input(s): CHOL, HDL, LDLCALC, TRIG, CHOLHDL, LDLDIRECT in the last 72 hours. Thyroid Function Tests: No results for input(s): TSH, T4TOTAL, FREET4, T3FREE, THYROIDAB in the last 72 hours. Anemia Panel: No results for input(s): VITAMINB12, FOLATE, FERRITIN, TIBC, IRON, RETICCTPCT in the last 72 hours. Urine analysis:    Component Value Date/Time   COLORURINE YELLOW 06/22/2021 0200   APPEARANCEUR CLOUDY (A) 06/22/2021 0200   LABSPEC 1.025 06/22/2021 0200   PHURINE 6.0 06/22/2021 0200   GLUCOSEU NEGATIVE 06/22/2021 0200   HGBUR LARGE (A) 06/22/2021 0200   BILIRUBINUR NEGATIVE 06/22/2021 0200   KETONESUR NEGATIVE 06/22/2021 0200   PROTEINUR 100 (A) 06/22/2021 0200   NITRITE NEGATIVE 06/22/2021 0200   LEUKOCYTESUR LARGE (A) 06/22/2021 0200    Radiological Exams on Admission: CT ABDOMEN PELVIS W CONTRAST  Result Date: 06/22/2021 CLINICAL DATA:  Abdominal pain. EXAM: CT ABDOMEN AND PELVIS WITH CONTRAST TECHNIQUE: Multidetector CT imaging of the abdomen and pelvis was performed using the standard protocol following bolus administration of intravenous contrast. CONTRAST:  1mL OMNIPAQUE IOHEXOL 300 MG/ML  SOLN COMPARISON:  None. FINDINGS: Lower chest: No acute abnormality. Hepatobiliary: A punctate focus of parenchymal low attenuation is seen within the medial aspect of the right lobe of the liver. This is too small to characterize by CT examination. No gallstones, gallbladder wall thickening, or biliary dilatation. Pancreas: Unremarkable. No pancreatic  ductal dilatation or surrounding inflammatory changes. Spleen: Normal in size without focal abnormality. Adrenals/Urinary Tract: Adrenal glands are unremarkable. Kidneys are normal, without renal calculi, focal lesion, or hydronephrosis. A Foley catheter is seen within the urinary bladder. Mild, diffuse, urinary bladder wall thickening is also noted. Stomach/Bowel: Stomach is within normal limits. Appendix appears normal. No evidence of bowel wall thickening, distention, or inflammatory changes. Vascular/Lymphatic: Aortic atherosclerosis. No enlarged abdominal or pelvic lymph nodes. Reproductive: Prostate is unremarkable. Other: A left lower quadrant ostomy site is seen. No abdominopelvic ascites. Musculoskeletal: A  large decubitus ulcer, with associated soft tissue thickening, is seen along the posterior aspect of the mid to lower coccyx. This measures approximately 4.1 cm x 0.8 cm. Associated cortical erosion of indeterminate age is noted along the posterior aspect of the coccyx. Degenerative changes are noted throughout the lumbar spine. IMPRESSION: 1. Large decubitus ulcer, with associated soft tissue thickening, along the posterior aspect of the mid to lower coccyx with additional findings consistent with osteomyelitis of indeterminate age. MRI correlation is recommended. 2. Mild, diffuse, urinary bladder wall thickening, which may represent cystitis. 3. Left lower quadrant ostomy site. 4. Aortic atherosclerosis. Aortic Atherosclerosis (ICD10-I70.0). Electronically Signed   By: Virgina Norfolk M.D.   On: 06/22/2021 02:44   DG Chest Port 1 View  Result Date: 06/22/2021 CLINICAL DATA:  Altered mental status. EXAM: PORTABLE CHEST 1 VIEW COMPARISON:  February 23, 2021 FINDINGS: The heart size and mediastinal contours are within normal limits. Both lungs are clear. Degenerative changes are seen involving the right shoulder and thoracic spine. IMPRESSION: No active disease. Electronically Signed   By: Virgina Norfolk M.D.   On: 06/22/2021 01:24       Athena Masse MD [facility]   06/22/2021, 3:39 AM

## 2021-06-22 NOTE — ED Notes (Signed)
ST with pt- pt given dinner tray

## 2021-06-22 NOTE — ED Notes (Signed)
Dressing changed using a wet to dry dressing

## 2021-06-22 NOTE — Sepsis Progress Note (Signed)
Monitoring for the code sepsis protocol. °

## 2021-06-22 NOTE — Consult Note (Addendum)
NAME: [name]  DOB: 1955-11-01  MRN: [id number]  Date/Time: 06/22/2021 12:16 PM  REQUESTING PROVIDER: Dr Leslye Peer Subjective:  REASON FOR CONSULT: fever , sacral wounds ? [name] is a 65 y.o. 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 She then noticed erythema and warmth on the back and knew it was cellulitis and brought him to the Ed He has chronic stage IV sacral wounds which is being managed by her with dakins /wound cleaner HE used to follow at [facility] until [facility] 2022 would clinic- He has been treated for osteo of sacrum last year for 6 weeks and this year was treated for heel ulceration and osteo- the left heel wound has healed completely- Pt takes a few steps with walker the wife says Wife says he chokes on cereal and certain foods  Past Medical History:  Diagnosis Date   Bipolar disorder ([place])    Diabetes mellitus without complication ([place])    History of blood clots    Hypertension    TBI (traumatic brain injury)     Past Surgical History:  Procedure Laterality Date   BACK SURGERY     CARPAL TUNNEL RELEASE Bilateral    COLON SURGERY     COLOSTOMY     TONSILLECTOMY      Social History   Socioeconomic History   Marital status: Married    Spouse name: thelma   Number of children: 0   Years of education: Not on file   Highest education level: Associate degree: occupational, Hotel manager, or  vocational program  Occupational History   Not on file  Tobacco Use   Smoking status: Never   Smokeless tobacco: Never  Vaping Use   Vaping Use: Never used  Substance and Sexual Activity   Alcohol use: Yes   Drug use: No   Sexual activity: Not on file  Other Topics Concern   Not on file  Social History Narrative   Not on file   Social Determinants of Health   Financial Resource Strain: Not on file  Food Insecurity: Not on file  Transportation Needs: Not on file  Physical Activity: Not on file  Stress: Not on file  Social Connections: Not on file  Intimate Partner Violence: Not on file    Family History  Problem Relation Age of Onset   Depression Father    Deep vein thrombosis Father    No Known Allergies I? Current Facility-Administered Medications  Medication Dose Route Frequency Provider Last Rate Last Admin   acetaminophen (TYLENOL) tablet 650 mg  650 mg Oral Q6H PRN Athena Masse, MD       Or   acetaminophen (TYLENOL) suppository 650 mg  650 mg Rectal Q6H PRN Athena Masse, MD       apixaban Arne Cleveland) tablet 5 mg  5 mg Oral BID Loletha Grayer, MD   5 mg at 06/22/21 0845   HYDROcodone-acetaminophen (NORCO/VICODIN)  5-325 MG per tablet 1-2 tablet  1-2 tablet Oral Q4H PRN Athena Masse, MD       insulin aspart (novoLOG) injection 0-20 Units  0-20 Units Subcutaneous TID WC Athena Masse, MD   4 Units at 06/22/21 0846   insulin aspart (novoLOG) injection 0-5 Units  0-5 Units Subcutaneous QHS Athena Masse, MD       lactated ringers infusion   Intravenous Continuous Loletha Grayer, MD 150 mL/hr at 06/22/21 0118 New Bag at 06/22/21 0118   memantine (NAMENDA) tablet 10 mg  10 mg Oral BID Loletha Grayer, MD   10 mg at 06/22/21 0845   ondansetron (ZOFRAN) tablet 4 mg  4 mg Oral Q6H PRN Athena Masse, MD       Or   ondansetron [facility]) injection 4 mg  4 mg Intravenous Q6H PRN Athena Masse, MD       Oxcarbazepine (TRILEPTAL) tablet 300 mg  300 mg Oral BID  Loletha Grayer, MD   300 mg at 06/22/21 0846   piperacillin-tazobactam (ZOSYN) IVPB 3.375 g  3.375 g Intravenous Q8H Athena Masse, MD   Stopped at 06/22/21 1059   QUEtiapine (SEROQUEL) tablet 100 mg  100 mg Oral QHS Wieting, Richard, MD       QUEtiapine (SEROQUEL) tablet 50 mg  50 mg Oral BID PRN Loletha Grayer, MD       Derrill Memo ON 06/23/2021] vancomycin (VANCOREADY) IVPB 750 mg/150 mL  750 mg Intravenous Q24H Rauer, Samantha O, RPH       vitamin B-12 (CYANOCOBALAMIN) tablet 3,000 mcg  3,000 mcg Oral Daily Loletha Grayer, MD   3,000 mcg at 06/22/21 [id number]   Current Outpatient Medications  Medication Sig Dispense Refill   ELIQUIS 5 MG TABS tablet Take 5 mg by mouth 2 (two) times daily.      glipiZIDE (GLUCOTROL XL) 10 MG 24 hr tablet Take 10 mg by mouth daily with breakfast.      memantine (NAMENDA) 10 MG tablet Take 10 mg by mouth 2 (two) times daily.     Oxcarbazepine (TRILEPTAL) 300 MG tablet Take 1 tablet (300 mg total) by mouth 2 (two) times daily. 180 tablet 0   QUEtiapine (SEROQUEL) 100 MG tablet Take 1 tablet (100 mg total) by mouth at bedtime. 90 tablet 1   rosuvastatin (CRESTOR) 20 MG tablet Take 20 mg by mouth daily.      amLODipine (NORVASC) 5 MG tablet Take 1 tablet (5 mg total) by mouth daily. 30 tablet 2   Omega-3 1000 MG CAPS Take by mouth.     QUEtiapine (SEROQUEL) 50 MG tablet TAKE 1 TABLET BY MOUTH TWICE DAILY AS NEEDED FOR  SEVERE  ANXIETY  AND  AGITATION 60 tablet 0   vitamin B-12 (CYANOCOBALAMIN) 1000 MCG tablet Take 3,000 mcg by mouth daily.       Abtx:  Anti-infectives (From admission, onward)    Start     Dose/Rate Route Frequency Ordered Stop   06/23/21 0200  vancomycin (VANCOREADY) IVPB 750 mg/150 mL  Status:  Discontinued        750 mg 150 mL/hr over 60 Minutes Intravenous Every 24 hours 06/22/21 0414 06/22/21 1019   06/23/21 0200  vancomycin (VANCOREADY) IVPB 750 mg/150 mL        750 mg 150 mL/hr over 60 Minutes Intravenous Every 24 hours 06/22/21 1019  06/29/21 0159   06/22/21 0600  piperacillin-tazobactam (ZOSYN) IVPB 3.375 g        3.375 g 12.5  mL/hr over 240 Minutes Intravenous Every 8 hours 06/22/21 0411     06/22/21 0145  metroNIDAZOLE (FLAGYL) IVPB 500 mg        500 mg 100 mL/hr over 60 Minutes Intravenous  Once 06/22/21 0136 06/22/21 0229   06/22/21 0145  vancomycin (VANCOCIN) IVPB 1000 mg/200 mL premix  Status:  Discontinued        1,000 mg 200 mL/hr over 60 Minutes Intravenous  Once 06/22/21 0136 06/22/21 0142   06/22/21 0145  vancomycin (VANCOREADY) IVPB 2000 mg/400 mL        2,000 mg 200 mL/hr over 120 Minutes Intravenous  Once 06/22/21 0142 06/22/21 0407   06/22/21 0100  ceFEPIme (MAXIPIME) 2 g in sodium chloride 0.9 % 100 mL IVPB        2 g 200 mL/hr over 30 Minutes Intravenous  Once 06/22/21 0057 06/22/21 0155       REVIEW OF SYSTEMS:  Const: fever, negative chills, negative weight loss Eyes: negative diplopia or visual changes, negative eye pain ENT: negative coryza, negative sore throat Resp: + cough, no hemoptysis, dyspnea Cards: negative for chest pain, palpitations, lower extremity edema GU: negative for frequency, dysuria and hematuria, has foley GI: Negative for abdominal pain, diarrhea, bleeding, constipation, has colostomy Skin: negative for rash and pruritus Heme: negative for easy bruising and gum/nose bleeding MS: General  weakness Neurolo:negative for headaches, dizziness, vertigo, memory problems  Psych:  anxiety, depression  Endocrine: diabetes Allergy/Immunology- NKDA Objective:  VITALS:  BP 119/72    Pulse 84    Temp 100.2 F (37.9 C) (Oral)    Resp 17    Ht 5\' 6"  (1.676 m)    Wt 98.9 kg    SpO2 98%    BMI 35.19 kg/m  PHYSICAL EXAM:  General: Alert, cooperative, no distress, appears stated age. Speech not comprehensible to me Head: Normocephalic, without obvious abnormality, atraumatic. Eyes: Conjunctivae clear, anicteric sclerae. Pupils are equal ENT Nares normal. No drainage or sinus  tenderness. Lips, mucosa, and tongue normal. No Thrush Neck: Supple, symmetrical, no adenopathy, thyroid: non tender no carotid bruit and no JVD. Back: 2 wounds - stage IV- clean base - doesn not probe to bone Macerated skin      Induration/erythema  Lungs: b/l air entry Heart: s1s2 Abdomen: Soft, ostomy Extremities: edema legs Left heel ulcer has healed completely Skin: No rashes or lesions. Or bruising Lymph: Cervical, supraclavicular normal. Neurologic: increased tone legs Pertinent Labs Lab Results CBC    Component Value Date/Time   WBC 10.7 (H) 06/22/2021 0046   RBC 3.38 (L) 06/22/2021 0046   HGB 9.1 (L) 06/22/2021 0046   HCT 28.8 (L) 06/22/2021 0046   PLT 217 06/22/2021 0046   MCV 85.2 06/22/2021 0046   MCH 26.9 06/22/2021 0046   MCHC 31.6 06/22/2021 0046   RDW 17.2 (H) 06/22/2021 0046   LYMPHSABS 1.0 06/22/2021 0046   MONOABS 0.8 06/22/2021 0046   EOSABS 0.1 06/22/2021 0046   BASOSABS 0.0 06/22/2021 0046    CMP Latest Ref Rng & Units 06/22/2021 04/16/2021 04/15/2021  Glucose 70 - 99 mg/dL 04/17/2021) - -  BUN 8 - 23 mg/dL 891(Q) - -  Creatinine 94(H - 1.24 mg/dL 0.38) 8.82(C) 0.03(K)  Sodium 135 - 145 mmol/L 127(L) - -  Potassium 3.5 - 5.1 mmol/L 4.2 - -  Chloride 98 - 111 mmol/L 99 - -  CO2 22 - 32 mmol/L 22 - -  Calcium 8.9 - 10.3 mg/dL 8.1(L) - -  Total Protein 6.5 -  8.1 g/dL 8.3(H) - -  Total Bilirubin 0.3 - 1.2 mg/dL 0.5 - -  Alkaline Phos 38 - 126 U/L 88 - -  AST 15 - 41 U/L 17 - -  ALT 0 - 44 U/L 14 - -      Microbiology: Recent Results (from the past 240 hour(s))  Resp Panel by RT-PCR (Flu A&B, Covid) Nasopharyngeal Swab     Status: None   Collection Time: 06/22/21 12:46 AM   Specimen: Nasopharyngeal Swab; Nasopharyngeal(NP) swabs in vial transport medium  Result Value Ref Range Status   SARS Coronavirus 2 by RT PCR NEGATIVE NEGATIVE Final    Comment: (NOTE) SARS-CoV-2 target nucleic acids are NOT DETECTED.  The SARS-CoV-2 RNA is  generally detectable in upper respiratory specimens during the acute phase of infection. The lowest concentration of SARS-CoV-2 viral copies this assay can detect is 138 copies/mL. A negative result does not preclude SARS-Cov-2 infection and should not be used as the sole basis for treatment or other patient management decisions. A negative result may occur with  improper specimen collection/handling, submission of specimen other than nasopharyngeal swab, presence of viral mutation(s) within the areas targeted by this assay, and inadequate number of viral copies(<138 copies/mL). A negative result must be combined with clinical observations, patient history, and epidemiological information. The expected result is Negative.  Fact Sheet for Patients:  EntrepreneurPulse.com.au  Fact Sheet for Healthcare Providers:  IncredibleEmployment.be  This test is no t yet approved or cleared by the [place] FDA and  has been authorized for detection and/or diagnosis of SARS-CoV-2 by FDA under an Emergency Use Authorization (EUA). This EUA will remain  in effect (meaning this test can be used) for the duration of the COVID-19 declaration under Section 564(b)(1) of the Act, 21 U.S.C.section 360bbb-3(b)(1), unless the authorization is terminated  or revoked sooner.       Influenza A by PCR NEGATIVE NEGATIVE Final   Influenza B by PCR NEGATIVE NEGATIVE Final    Comment: (NOTE) The Xpert Xpress SARS-CoV-2/FLU/RSV plus assay is intended as an aid in the diagnosis of influenza from Nasopharyngeal swab specimens and should not be used as a sole basis for treatment. Nasal washings and aspirates are unacceptable for Xpert Xpress SARS-CoV-2/FLU/RSV testing.  Fact Sheet for Patients: EntrepreneurPulse.com.au  Fact Sheet for Healthcare Providers: IncredibleEmployment.be  This test is not yet approved or cleared by the [place] FDA and has been authorized for detection and/or diagnosis of SARS-CoV-2 by FDA under an Emergency Use Authorization (EUA). This EUA will remain in effect (meaning this test can be used) for the duration of the COVID-19 declaration under Section 564(b)(1) of the Act, 21 U.S.C. section 360bbb-3(b)(1), unless the authorization is terminated or revoked.  Performed at [facility], [address] [place], [place] [address]   Blood Culture (routine x 2)     Status: None (Preliminary result)   Collection Time: 06/22/21 12:46 AM   Specimen: BLOOD  Result Value Ref Range Status   Specimen Description BLOOD LEFT FOREARM  Final   Special Requests   Final    BOTTLES DRAWN AEROBIC AND ANAEROBIC Blood Culture results may not be optimal due to an inadequate volume of blood received in culture bottles   Culture   Final    NO GROWTH < 12 HOURS Performed at [facility], [address] [place], [place] [address]    Report Status PENDING  Incomplete  Blood Culture (routine x 2)     Status: None (  Preliminary result)   Collection Time: 06/22/21 12:46 AM   Specimen: BLOOD  Result Value Ref Range Status   Specimen Description BLOOD LEFT ASSIST CONTROL  Final   Special Requests   Final    BOTTLES DRAWN AEROBIC AND ANAEROBIC Blood Culture adequate volume   Culture   Final    NO GROWTH < 12 HOURS Performed at [facility], [address], [place], [place] [address]    Report Status PENDING  Incomplete    IMAGING RESULTS:   CT abdomen/pelvis Large decubitus ulcer, with associated soft tissue thickening,along the posterior aspect of the mid to lower coccyx with additional findings consistent with osteomyelitis of indeterminate age. I have personally reviewed the films ? Impression/Recommendation ? 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 Aspiration risk- need speech eval Discussed the management with patient , wife and the requesting provider ID will follow him peripherally this weekend- call if needed  Note:  This document was prepared using Dragon voice recognition software and may include unintentional dictation errors.

## 2021-06-22 NOTE — Progress Notes (Signed)
Patient arrived to unit.Perlie Mayo, RN

## 2021-06-22 NOTE — ED Notes (Signed)
Pt foley removed and replaced.

## 2021-06-22 NOTE — Progress Notes (Signed)
Pharmacy Antibiotic Note  [name] is a 65 y.o. male admitted on 06/22/2021 with cellulitis.  Pharmacy has been consulted for vancomycin, zosyn dosing.  Plan: Zosyn 3.375 gm IV Q8H EI ordered to start on 12/23 @ 0600.  Vancomycin 2 gm IV X 1 given on 12/23 @ 0156. Vancomycin 750 mg IV Q24H ordered to continue on 12/24 @ 0200.  AUC = 463.2 Vanc trough = 13.1   Height: 5\' 6"  (167.6 cm) Weight: 98.9 kg (218 lb 0.6 oz) IBW/kg (Calculated) : 63.8  Temp (24hrs), Avg:100.2 F (37.9 C), Min:100.2 F (37.9 C), Max:100.2 F (37.9 C)  Recent Labs  Lab 06/22/21 0046  WBC 10.7*  CREATININE 1.92*  LATICACIDVEN 1.9    Estimated Creatinine Clearance: 41.6 mL/min (A) (by C-G formula based on SCr of 1.92 mg/dL (H)).    No Known Allergies  Antimicrobials this admission:   >>    >>   Dose adjustments this admission:   Microbiology results:  BCx:   UCx:    Sputum:    MRSA PCR:   Thank you for allowing pharmacy to be a part of this patients care.  [name] D 06/22/2021 4:14 AM

## 2021-06-22 NOTE — Evaluation (Addendum)
Clinical/Bedside Swallow Evaluation Patient Details  Name: [name] MRN: [id number] Date of Birth: July 18, 1955  Today's Date: 06/22/2021 Time: SLP Start Time (ACUTE ONLY): 4 SLP Stop Time (ACUTE ONLY): 1655 SLP Time Calculation (min) (ACUTE ONLY): 45 min  Past Medical History:  Past Medical History:  Diagnosis Date   Bipolar disorder ([place])    Diabetes mellitus without complication ([place])    History of blood clots    Hypertension    TBI (traumatic brain injury)    Past Surgical History:  Past Surgical History:  Procedure Laterality Date   BACK SURGERY     CARPAL TUNNEL RELEASE Bilateral    COLON SURGERY     COLOSTOMY     TONSILLECTOMY     HPI:  Pt  is a 65 y.o. 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  He denies fever or chills and denies cough, shortness of breath.  Denies nausea or vomiting or abdominal pain or diarrhea.  History is limited likely due to history of TBI.  His wife was concerned that he appeared altered from baseline and was concerned about a rash on his back and lower abdomen.  ED course: On arrival temperature 100.2, soft blood pressure of 99/59 improving to 116/69 with IV fluids, pulse in the 80s.  Blood work: WBC 10.7, hemoglobin 9.1  Sodium 127, creatinine 1.19 above baseline of 1.5.  Blood glucose 256  Lactic acid 1.9  Urinalysis with large leukocyte esterase.  Imaging: CT  abdomen and pelvis with contrast showing a Large decubitus ulcer mid to lower coccyx with additional findings consistent with osteomyelitis and possible cystitis.  CXR with no acute disease.    Assessment / Plan / Recommendation  Clinical Impression  Pt appears to present w/ adequate oropharyngeal phase swallow function w/ No overt oropharyngeal phase dysphagia noted, No neuromuscular deficits noted. Pt consumed po trials w/ No overt, clinical s/s of aspiration during po trials. Pt appears at reduced risk for aspiration following general aspiration precautions during oral intake -- that includes sitting UPRIGHT, FORWARD during oral intake.     During po trials, pt consumed all consistencies w/ no overt coughing, decline in vocal quality, or change in respiratory presentation during/post trials. Pt consumed milk via straw quickly. Oral phase appeared [facility] w/ timely bolus management, mastication, and control of bolus propulsion for A-P transfer for swallowing. Oral clearing achieved w/ all trial consistencies. OM Exam appeared [facility] w/ no unilateral weakness noted. Speech Clear. Pt fed self w/ setup support.     Recommend continue a Regular consistency diet w/ well-Cut meats, moistened foods -- monitor mixed consistency foods such as milk/cereal and veggie soup d/t the nature of its mixed consistency(drain off milk and broth). Thin liquids. Recommend general aspiration precautions. Reduce distractions at meals. Pills WHOLE in Puree for safer, easier swallowing IF any difficulty swallowing w/ liquids.   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 Education given on Pills in Puree to NSG/pt if needed; food consistencies and easy to eat options;  general aspiration precautions(handout given). MD to reconsult if any new needs arise while admitted. NSG agreed. SLP Visit Diagnosis: Dysphagia, unspecified (R13.10) (wife had stated to MD that pt "chokes" when eating cereal and milk at home)    Aspiration Risk   (reduced following general precautions)    Diet Recommendation   Regular consistency diet w/ well-Cut meats, moistened foods -- monitor mixed consistency foods such as milk/cereal and veggie soup d/t the nature of its mixed consistency(drain off milk and broth). Thin liquids - slowly. Recommend general aspiration precautions. Reduce distractions at meals.   Medication Administration: Whole meds with liquid (but WHOLE in Puree if needed)    Other  Recommendations Recommended Consults:  (Dietician f/u) Oral Care Recommendations: Oral care BID;Oral care before and after PO;Patient independent with oral care Other Recommendations:  (n/a)    Recommendations for follow up therapy are one component of a multi-disciplinary discharge planning process, led by the attending physician.  Recommendations may be updated based on patient status, additional functional criteria and insurance authorization.  Follow up Recommendations No SLP follow up      Assistance Recommended at Discharge Set up Supervision/Assistance (if needed for positioning UPRIGHT)  Functional Status Assessment Patient has had a recent decline in their functional status and demonstrates the ability to make significant improvements in function in a reasonable and predictable amount of time.  Frequency and Duration  (n/a)   (n/a)       Prognosis Prognosis for Safe Diet Advancement: Good Barriers to Reach Goals: Cognitive deficits;Time post onset;Severity of deficits;Behavior;Motivation Barriers/Prognosis Comment: chronic Cognitive decline; TBI; Bipolar dis.      Swallow Study   General Date of Onset: 06/22/21 HPI: Pt  is a 65 y.o. 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  He denies fever or chills and denies cough, shortness of breath.  Denies nausea or vomiting or abdominal pain or diarrhea.  History is limited likely due to history of TBI.  His wife was concerned that he appeared altered from baseline and was concerned about a rash on his back and lower abdomen.  ED course: On arrival temperature 100.2, soft blood pressure of 99/59 improving to 116/69 with IV fluids, pulse in the 80s.  Blood work: WBC 10.7, hemoglobin 9.1  Sodium 127, creatinine 1.19 above baseline of 1.5.  Blood glucose 256  Lactic acid 1.9  Urinalysis with large leukocyte esterase.  Imaging: CT abdomen and pelvis with contrast showing a Large decubitus ulcer mid to lower coccyx with additional findings consistent with osteomyelitis and possible cystitis.  CXR with no acute disease. Type of Study: Bedside Swallow Evaluation Previous Swallow Assessment: none Diet Prior to this Study: Regular;Thin liquids Temperature Spikes Noted: No Respiratory Status: Room air History of Recent Intubation: No Behavior/Cognition: Alert;Cooperative;Pleasant mood;Requires cueing (min; asked a lot of questions) Oral  Cavity Assessment: Within Functional Limits Oral Care Completed by SLP: Yes Oral Cavity - Dentition: Adequate natural dentition Vision: Functional for self-feeding Self-Feeding Abilities: Able to feed self;Needs set  up Patient Positioning: Upright in bed (min resistant to sitting fully upright -- used a towel roll behind head) Baseline Vocal Quality: Normal;Low vocal intensity Volitional Cough: Strong Volitional Swallow: Able to elicit    Oral/Motor/Sensory Function Overall Oral Motor/Sensory Function: Within functional limits   Ice Chips Ice chips: Within functional limits Presentation: Spoon (fed; 1 trial)   Thin Liquid Thin Liquid: Within functional limits Presentation: Self Fed;Straw (~6 ozs) Other Comments: water, then a milk    Nectar Thick Nectar Thick Liquid: Not tested   Honey Thick Honey Thick Liquid: Not tested   Puree Puree: Within functional limits Presentation: Self Fed;Spoon (4 ozs)   Solid     Solid: Within functional limits Presentation: Spoon (fed; graham cracker sandwiches w/ ice cream)         Orinda Kenner, MS, CCC-SLP Speech Language Pathologist Rehab Services [phone] Akaiya Touchette 06/22/2021,4:56 PM

## 2021-06-22 NOTE — Progress Notes (Signed)
CODE SEPSIS - PHARMACY COMMUNICATION  **Broad Spectrum Antibiotics should be administered within 1 hour of Sepsis diagnosis**  Time Code Sepsis Called/Page Received:  12/23 @ 0057  Antibiotics Ordered: Cefepime   Time of 1st antibiotic administration: Cefepime 2 gm IV X 1 on 12/23 @ 0118.   Additional action taken by pharmacy:   If necessary, Name of Provider/Nurse Contacted:     Pattricia Weiher D ,PharmD Clinical Pharmacist  06/22/2021  1:29 AM

## 2021-06-22 NOTE — Progress Notes (Signed)
PHARMACY -  BRIEF ANTIBIOTIC NOTE   Pharmacy has received consult(s) for Vancomycin from an ED provider.  The patient's profile has been reviewed for ht/wt/allergies/indication/available labs.    One time order(s) placed for Vancomycin 2 gm IV X 1   Further antibiotics/pharmacy consults should be ordered by admitting physician if indicated.                       Thank you, Johnney Scarlata D 06/22/2021  1:43 AM

## 2021-06-23 DIAGNOSIS — N189 Chronic kidney disease, unspecified: Secondary | ICD-10-CM

## 2021-06-23 DIAGNOSIS — L89154 Pressure ulcer of sacral region, stage 4: Secondary | ICD-10-CM

## 2021-06-23 DIAGNOSIS — E1129 Type 2 diabetes mellitus with other diabetic kidney complication: Secondary | ICD-10-CM

## 2021-06-23 LAB — CREATININE, SERUM
Creatinine, Ser: 1.86 mg/dL — ABNORMAL HIGH (ref 0.61–1.24)
GFR, Estimated: 39 mL/min — ABNORMAL LOW (ref >60)

## 2021-06-23 LAB — RESPIRATORY PANEL BY PCR

## 2021-06-23 LAB — GLUCOSE, CAPILLARY
Glucose-Capillary: 103 mg/dL — ABNORMAL HIGH (ref 70–99)
Glucose-Capillary: 194 mg/dL — ABNORMAL HIGH (ref 70–99)
Glucose-Capillary: 121 mg/dL — ABNORMAL HIGH (ref 70–99)
Glucose-Capillary: 145 mg/dL — ABNORMAL HIGH (ref 70–99)

## 2021-06-23 LAB — HEMOGLOBIN A1C
Hgb A1c MFr Bld: 7.3 % — ABNORMAL HIGH (ref 4.8–5.6)
Mean Plasma Glucose: 163 mg/dL

## 2021-06-23 MED ORDER — CHLORHEXIDINE GLUCONATE CLOTH 2 % EX PADS
6.0000 | MEDICATED_PAD | Freq: Every day | CUTANEOUS | Status: DC
  Administered 2021-06-23 – 2021-06-24 (×2): 6 via TOPICAL

## 2021-06-23 NOTE — Progress Notes (Addendum)
Patient ID: Markeese Boyajian, male   DOB: Mar 21, 1956, 65 y.o.   MRN: [id number] [facility] PROGRESS NOTE  Praveen Coia SWF:[id number] DOB: [birth year] DOA: 06/22/2021 PCP: Dione Booze, MD  HPI/Subjective: Patient feels okay.  Offers no complaints.  Admitted with sepsis and cellulitis.  Objective: Vitals:   06/23/21 0731 06/23/21 1154  BP: 116/62 115/64  Pulse: 73 81  Resp: 14 16  Temp: (!) 97.5 F (36.4 C) 98.1 F (36.7 C)  SpO2: 100% 100%    Intake/Output Summary (Last 24 hours) at 06/23/2021 1347 Last data filed at 06/23/2021 1042 Gross per 24 hour  Intake 3100.14 ml  Output 1150 ml  Net 1950.14 ml   Filed Weights   06/22/21 0119  Weight: 98.9 kg    ROS: Review of Systems  Respiratory:  Negative for shortness of breath.   Cardiovascular:  Negative for chest pain.  Gastrointestinal:  Negative for abdominal pain, nausea and vomiting.  Exam: Physical Exam HENT:     Head: Normocephalic.     Mouth/Throat:     Pharynx: No oropharyngeal exudate.  Eyes:     General: Lids are normal.     Conjunctiva/sclera: Conjunctivae normal.  Cardiovascular:     Rate and Rhythm: Normal rate and regular rhythm.     Heart sounds: Normal heart sounds, S1 normal and S2 normal.  Pulmonary:     Breath sounds: No decreased breath sounds, wheezing, rhonchi or rales.  Abdominal:     Palpations: Abdomen is soft.     Tenderness: There is no abdominal tenderness.  Musculoskeletal:     Right lower leg: Swelling present.     Left lower leg: Swelling present.  Skin:    General: Skin is warm.     Comments: Slight erythema around the hip  Neurological:     Mental Status: He is alert.     Comments: Slightly able to straight leg raise bilateral legs      Scheduled Meds:  apixaban  5 mg Oral BID   Chlorhexidine Gluconate Cloth  6 each Topical Daily   insulin aspart  0-20 Units Subcutaneous TID WC   insulin aspart  0-5 Units Subcutaneous QHS   memantine  10 mg  Oral BID   Oxcarbazepine  300 mg Oral BID   QUEtiapine  100 mg Oral QHS   vitamin B-12  3,000 mcg Oral Daily   Continuous Infusions:  piperacillin-tazobactam (ZOSYN)  IV 3.375 g (06/23/21 0604)   vancomycin 750 mg (06/23/21 0241)    Assessment/Plan:  Clinical sepsis, present on admission patient had a fever of 100.2, leukocytosis and cellulitis.  Patient does have chronic osteomyelitis with a stage IV decubiti.  Follow-up wound culture and blood cultures.  Patient on empiric vancomycin and Zosyn.  Urine culture growing only 30,000 colonies of Pseudomonas.  Likely colonization from catheter. Acute kidney injury on chronic kidney disease stage IIIb.  Creatinine 1.92 on presentation.  Baseline creatinine about 1.69.  Today's creatinine 1.86.  We will discontinue IV fluids and just monitor. Stage IV sacral decubiti, present on admission with chronic osteomyelitis.  This has been treated in the past.  ID does not think we need to treat with long-term antibiotics. Recurrent DVT on Eliquis Traumatic brain injury and bipolar disorder on oxycodone as needed Seroquel Type 2 diabetes mellitus with CKD stage IIIb with hemoglobin A1c of 7.3.  Patient on sliding scale insulin. Weakness.  PT and OT evaluations for       Code Status:     Code Status  Orders  (From admission, onward)           Start     Ordered   06/22/21 0402  Full code  Continuous        06/22/21 0402           Code Status History     Date Active Date Inactive Code Status Order ID Comments User Context   02/23/2021 1651 03/03/2021 2300 Full Code [id number]  Wyvonnia Dusky, MD ED   03/18/2020 1553 03/22/2020 2055 Full Code [id number]  Collier Bullock, MD ED   01/16/2020 1840 01/19/2020 1945 Full Code [id number]  Ivor Costa, MD Inpatient   01/02/2016 2050 01/04/2016 2147 Full Code [id number]  Baxter Hire, MD Inpatient      Family Communication: Left message for wife Disposition Plan: Status is:  Inpatient  Antibiotics: Vancomycin and [place]  [facility]

## 2021-06-23 NOTE — Evaluation (Signed)
Occupational Therapy Evaluation Patient Details Name: [name] MRN: [id number] DOB: 1955/07/13 Today's Date: 06/23/2021   History of Present Illness Pt is a 65 y/o M with PMH: BPD, DM, HTN, TBI c cognitive impairment, CKD3, colostomy, CKD3a, DVCT on eliquis, comes to [facility] on 12/23 2/2 malaise and decreased PO intake ~3 days. CT shows sacral decubitus ulcer with signs of underlying osteomyelitis. PT also adm with cellulitis on back of abdomen.   Clinical Impression   Pt seen for OT evaluation this date in setting of acute hospitalization d/t cellulitis. Pt is somewhat poor historian r/t cognition and no family present to confirm, but he describes use of hospital bed and stand-pivot transfers with his wife or aide to w/c or recliner. He reports that his aide is there 3x/wk to help with ADLs and spouse performs all IADLS and assists with ADLs on the remaining 4 days/wk. Pt requires MIN A for sup to long sit and MOD/MAX A for rolling. OT engages pt in bathing tasks with MOD/MAX A using lateral rolling technique in bed as well as MIN/MOD cues for pt to use bed rails to assist with rolling process. Noted to be more successful with use of R UE to reach across and roll to L side. Pt requires MAX A (with use of B UE with cueing, requires tactile cues to reach L UE toward headboard) and trendelenburg to slightly advance toward HOB in prep for meal time. OT uses chair position setting in bed to optimize pt for meal and he requires MIN A to setup tray (OT cuts french toast, but pt is able to open and spread syrup with cues and R UE performing [facility] aspects of task. Pt left with meal tray setup, in chair position, with bed alarm set. RN updated on pt status. Will continue to follow. Left with all needs met and in reach. Anticipate pt will require f/u [place] and hoyer lift at home to assist spouse in mobilizing patient.      Recommendations for follow up therapy are one component of a  multi-disciplinary discharge planning process, led by the attending physician.  Recommendations may be updated based on patient status, additional functional criteria and insurance authorization.   Follow Up Recommendations  Home health OT    Assistance Recommended at Discharge Frequent or constant Supervision/Assistance  Functional Status Assessment     Equipment Recommendations  Other (comment) (hoyer lift)    Recommendations for Other Services       Precautions / Restrictions Precautions Precautions: Fall Restrictions Weight Bearing Restrictions: No      Mobility Bed Mobility Overal bed mobility: Needs Assistance Bed Mobility: Rolling Rolling: Mod assist;Max assist         General bed mobility comments: sup to lon gsitting with MIN A, MOD/MAX A to roll for bathing/drying/lotion. declines further mobilization    Transfers                   General transfer comment: deferred, pt declines      Balance                                           ADL either performed or assessed with clinical judgement   ADL  General ADL Comments: Pt requires SETUP to MIN A for seated UB ADLs, MAX/TOTAL A For LB ADLs bed level. Sup to long sitting with MIN A.     Vision Baseline Vision/History: 1 Wears glasses Patient Visual Report: No change from baseline       Perception     Praxis      Pertinent Vitals/Pain Pain Assessment: Faces Faces Pain Scale: No hurt     Hand Dominance Right   Extremity/Trunk Assessment Upper Extremity Assessment Upper Extremity Assessment: Generalized weakness;RUE deficits/detail;LUE deficits/detail RUE Deficits / Details: generally weak, Shld ROM to 3/4 range LUE Deficits / Details: genearlly weak, L appears weaker than R, shld ROM to 1/2 range   Lower Extremity Assessment Lower Extremity Assessment: Generalized weakness       Communication  Communication Communication: HOH   Cognition Arousal/Alertness: Awake/alert Behavior During Therapy: Flat affect Overall Cognitive Status: No family/caregiver present to determine baseline cognitive functioning                                 General Comments: Pt able to follow basic commands with increased processing time, he is somewhat repetitive with requests and not oriented to situation.     General Comments       Exercises Other Exercises Other Exercises: OT engages pt in ed re: role, engages pt in bathing and rolling tasks with MOD/MAX A bed level   Shoulder Instructions      Home Living Family/patient expects to be discharged to:: Private residence Living Arrangements: Spouse/significant other Available Help at Discharge: Family;Available PRN/intermittently;Personal care attendant (PCA 3x/wk) Type of Home: House Home Access: Stairs to enter;Ramped entrance Entrance Stairs-Number of Steps: ramp to garage   Home Layout: One level               Home Equipment: Wheelchair - manual;Wheelchair - power;Hospital bed;Shower seat          Prior Functioning/Environment Prior Level of Function : Needs assist       Physical Assist : Mobility (physical);ADLs (physical) Mobility (physical): Transfers;Bed mobility ADLs (physical): IADLs;Toileting;Dressing;Bathing Mobility Comments: pt reports being able to SPS from bed to w/c or recliner with assistance of one person, reports he can roll at baseline ADLs Comments: spouse and/or aide assists with ADLs, mostly performed bed level.        OT Problem List: Decreased strength;Decreased activity tolerance      OT Treatment/Interventions: Self-care/ADL training;Therapeutic exercise;Therapeutic activities;DME and/or AE instruction    OT Goals(Current goals can be found in the care plan section) Acute Rehab OT Goals Patient Stated Goal: none stated OT Goal Formulation: With patient Time For Goal  Achievement: 07/07/21 Potential to Achieve Goals: Fair ADL Goals Pt Will Perform Eating: with modified independence Pt Will Perform Grooming: with set-up;sitting (supported sitting) Pt/caregiver will Perform Home Exercise Program: Increased strength;Right Upper extremity;Increased ROM;Left upper extremity;With minimal assist Additional ADL Goal #1: Pt will be able to tolerate sup to sit transition with MOD A +1 to further allow for development of OT POC As it relates to functional transfers and sitting ADLs.  OT Frequency: Min 2X/week   Barriers to D/C:            Co-evaluation              AM-PAC OT "6 Clicks" Daily Activity     Outcome Measure Help from another person eating meals?: A Little Help from another person taking care  of personal grooming?: A Little Help from another person toileting, which includes using toliet, bedpan, or urinal?: A Lot Help from another person bathing (including washing, rinsing, drying)?: A Lot Help from another person to put on and taking off regular upper body clothing?: A Little Help from another person to put on and taking off regular lower body clothing?: Total 6 Click Score: 14   End of Session    Activity Tolerance: Patient tolerated treatment well Patient left: in bed;with call bell/phone within reach;with bed alarm set;Other (comment) (chair position for meals)  OT Visit Diagnosis: Muscle weakness (generalized) (M62.81);Other symptoms and signs involving cognitive function                Time: 3202-3343 OT Time Calculation (min): 34 min Charges:  OT General Charges $OT Visit: 1 Visit OT Evaluation $OT Eval Moderate Complexity: 1 Mod OT Treatments $Self Care/Home Management : 8-22 mins  Gerrianne Scale, MS, OTR/L ascom [phone] 06/23/21, 10:39 AM

## 2021-06-23 NOTE — Progress Notes (Signed)
PT Cancellation Note  Patient Details Name: [name] MRN: [id number] DOB: 04/26/1956   Cancelled Treatment:    Reason Eval/Treat Not Completed: Other (comment) (Pt engaged for ~5 minutes. Ultimately he refuses any bed mobility at this time, repeatedly cites his acute weakened state, falls anxiety.) Chartered loss adjuster explains author's role and expertise, advanced skill set of managing complex mobility needs for patients in super safe capacity. Pt declines multiple times, asks repeatedly to defer to 'Monday'. Pt says he's up for doing 'some hand exercises or something.' Will attempt evaluation again at later date/time.   11:21 AM, 06/23/21 [name], PT, DPT Physical Therapist - [facility]  [phone] (ASCOM)     Jamia Hoban C 06/23/2021, 11:19 AM

## 2021-06-24 DIAGNOSIS — M4628 Osteomyelitis of vertebra, sacral and sacrococcygeal region: Secondary | ICD-10-CM

## 2021-06-24 LAB — BASIC METABOLIC PANEL
Anion gap: 7 (ref 5–15)
BUN: 19 mg/dL (ref 8–23)
CO2: 21 mmol/L — ABNORMAL LOW (ref 22–32)
Calcium: 8.6 mg/dL — ABNORMAL LOW (ref 8.9–10.3)
Chloride: 104 mmol/L (ref 98–111)
Creatinine, Ser: 1.88 mg/dL — ABNORMAL HIGH (ref 0.61–1.24)
GFR, Estimated: 39 mL/min — ABNORMAL LOW (ref >60)
Glucose, Bld: 178 mg/dL — ABNORMAL HIGH (ref 70–99)
Potassium: 3.9 mmol/L (ref 3.5–5.1)
Sodium: 132 mmol/L — ABNORMAL LOW (ref 135–145)

## 2021-06-24 LAB — URINE CULTURE: Culture: 30000 — AB

## 2021-06-24 LAB — CBC
HCT: 27.6 % — ABNORMAL LOW (ref 39.0–52.0)
Hemoglobin: 8.7 g/dL — ABNORMAL LOW (ref 13.0–17.0)
MCH: 26.4 pg (ref 26.0–34.0)
MCHC: 31.5 g/dL (ref 30.0–36.0)
MCV: 83.9 fL (ref 80.0–100.0)
Platelets: 241 10*3/uL (ref 150–400)
RBC: 3.29 MIL/uL — ABNORMAL LOW (ref 4.22–5.81)
RDW: 17.2 % — ABNORMAL HIGH (ref 11.5–15.5)
WBC: 6 10*3/uL (ref 4.0–10.5)
nRBC: 0 % (ref 0.0–0.2)

## 2021-06-24 LAB — GLUCOSE, CAPILLARY
Glucose-Capillary: 181 mg/dL — ABNORMAL HIGH (ref 70–99)
Glucose-Capillary: 161 mg/dL — ABNORMAL HIGH (ref 70–99)
Glucose-Capillary: 99 mg/dL (ref 70–99)
Glucose-Capillary: 154 mg/dL — ABNORMAL HIGH (ref 70–99)

## 2021-06-24 MED ORDER — DOXYCYCLINE HYCLATE 100 MG PO TABS
100.0000 mg | ORAL_TABLET | Freq: Two times a day (BID) | ORAL | Status: DC
Start: 2021-06-24 — End: 2021-06-25
  Administered 2021-06-24 – 2021-06-25 (×2): 100 mg via ORAL
  Filled 2021-06-24 (×2): qty 1

## 2021-06-24 NOTE — Evaluation (Signed)
Physical Therapy Evaluation Patient Details Name: [name] MRN: [id number] DOB: [birth year] Today's Date: 06/24/2021  History of Present Illness  Pt is a 65 y/o M with PMH: BPD, DM, HTN, TBI c cognitive impairment, CKD3, colostomy, CKD3a, DVCT on eliquis, comes to [facility] on 12/23 2/2 malaise and decreased PO intake ~3 days. CT shows sacral decubitus ulcer with signs of underlying osteomyelitis. PT also adm with cellulitis on back of abdomen.  Clinical Impression  Pt seen for PT evaluation with pt received in bed. Pt is AxO x 3 but is unable to provide thorough PLOF information & unable to follow some simple commands throughout session. Pt requires max assist for supine<>sit & 2-3 sit>stand attempts from elevated EOB. Pt is limited by narrow BOS with sit>stand. Pt attempts to scoot L along EOB but is unable to follow instructions well. At this time pt does seem fairly close to his baseline in regards to functional mobility but PT recommends hoyer lift & hospital bed to decrease burden of care on caregiver. Will continue to follow pt acutely to progress independence with bed mobility & transfers as able.       Recommendations for follow up therapy are one component of a multi-disciplinary discharge planning process, led by the attending physician.  Recommendations may be updated based on patient status, additional functional criteria and insurance authorization.  Follow Up Recommendations Home health PT    Assistance Recommended at Discharge Frequent or constant Supervision/Assistance  Functional Status Assessment  (minimal decline)  Equipment Recommendations  Hospital bed (hoyer lift)    Recommendations for Other Services       Precautions / Restrictions Precautions Precautions: Fall Precaution Comments: foley catheter & ostomy bag Restrictions Weight Bearing Restrictions: No      Mobility  Bed Mobility Overal bed mobility: Needs Assistance Bed Mobility: Rolling;Supine to  Sit;Sit to Supine Rolling: Mod assist   Supine to sit: Max assist;HOB elevated Sit to supine: Max assist;HOB elevated        Transfers Overall transfer level: Needs assistance Equipment used: Rolling walker (2 wheels) Transfers: Sit to/from Stand Sit to Stand: Max assist           General transfer comment: cuing for hand placement, PT attempts to cue & even physically place feet on floor for wide BOS but pt continues to place them so close together they are touching    Ambulation/Gait                  Stairs            Wheelchair Mobility    Modified Rankin (Stroke Patients Only)       Balance Overall balance assessment: Needs assistance Sitting-balance support: Bilateral upper extremity supported;Feet supported Sitting balance-Leahy Scale: Fair Sitting balance - Comments: supervision   Standing balance support: During functional activity;Bilateral upper extremity supported Standing balance-Leahy Scale: Zero                               Pertinent Vitals/Pain Pain Assessment: Faces Faces Pain Scale: No hurt    Home Living Family/patient expects to be discharged to:: Private residence Living Arrangements: Spouse/significant other Available Help at Discharge: Family;Available PRN/intermittently;Personal care attendant (PCA 3x/week) Type of Home: House Home Access: Stairs to enter;Ramped entrance   Entrance Stairs-Number of Steps: ramp to garage     Home Equipment: Wheelchair - manual;Wheelchair - power;Hospital bed;Shower seat      Prior Function Prior Level  of Function : Needs assist       Physical Assist : Mobility (physical);ADLs (physical) Mobility (physical): Transfers;Bed mobility ADLs (physical): IADLs;Toileting;Dressing;Bathing Mobility Comments: Pt reports his wife assists him with stand pivot transfers.       Hand Dominance        Extremity/Trunk Assessment   Upper Extremity Assessment Upper Extremity  Assessment: Defer to OT evaluation;Generalized weakness RUE Deficits / Details: generally weak, Shld ROM to 3/4 range LUE Deficits / Details: genearlly weak, L appears weaker than R, shld ROM to 1/2 range    Lower Extremity Assessment Lower Extremity Assessment: Generalized weakness (tight heel cords, PT able to almost achieve neutral dorsiflexion PROM, knees extended, difficulty demonstrating wide BOS to increase ease of standing as pt continues to have feet touching, BLE edema)       Communication   Communication: HOH  Cognition Arousal/Alertness: Awake/alert Behavior During Therapy: Flat affect Overall Cognitive Status: No family/caregiver present to determine baseline cognitive functioning                                 General Comments: Pt is AxO to location, year/month, self, grossly aware of situation (reports he has an infection). Pt with poor ability to follow simple commands, during conversation about PLOF pt answers "a little bit" to most questions & is unable to describe in detail how he mobilized. Does report his wife assists him but unsure re: use of RW.        General Comments      Exercises     Assessment/Plan    PT Assessment Patient needs continued PT services  PT Problem List Decreased mobility;Decreased strength;Decreased safety awareness;Decreased activity tolerance;Decreased balance;Decreased knowledge of use of DME;Decreased cognition;Decreased skin integrity       PT Treatment Interventions DME instruction;Therapeutic exercise;Balance training;Wheelchair mobility training;Gait training;Neuromuscular re-education;Functional mobility training;Cognitive remediation;Therapeutic activities;Patient/family education    PT Goals (Current goals can be found in the Care Plan section)  Acute Rehab PT Goals Patient Stated Goal: none stated PT Goal Formulation: With patient Time For Goal Achievement: 07/08/21 Potential to Achieve Goals: Good     Frequency Min 2X/week   Barriers to discharge        Co-evaluation               AM-PAC PT "6 Clicks" Mobility  Outcome Measure Help needed turning from your back to your side while in a flat bed without using bedrails?: A Lot Help needed moving from lying on your back to sitting on the side of a flat bed without using bedrails?: Total Help needed moving to and from a bed to a chair (including a wheelchair)?: Total Help needed standing up from a chair using your arms (e.g., wheelchair or bedside chair)?: Total Help needed to walk in hospital room?: Total Help needed climbing 3-5 steps with a railing? : Total 6 Click Score: 7    End of Session Equipment Utilized During Treatment: Gait belt Activity Tolerance: Patient tolerated treatment well Patient left: in bed;with bed alarm set;with call bell/phone within reach;with nursing/sitter in room Nurse Communication: Mobility status PT Visit Diagnosis: Muscle weakness (generalized) (M62.81);Difficulty in walking, not elsewhere classified (R26.2);Other abnormalities of gait and mobility (R26.89)    Time: 1740-8144 PT Time Calculation (min) (ACUTE ONLY): 13 min   Charges:   PT Evaluation $PT Eval Moderate Complexity: 1 Mod          Aleda Grana, PT, DPT  06/24/21, 12:05 PM   Sandi Mariscal 06/24/2021, 12:03 PM

## 2021-06-24 NOTE — Progress Notes (Signed)
Patient ID: Delsin Copen, male   DOB: [birth year], 65 y.o.   MRN: [id number] [facility] PROGRESS NOTE  Corrado Hymon GGY:[id number] DOB: 27-May-1956 DOA: 06/22/2021 PCP: Dione Booze, MD  HPI/Subjective: Patient feels okay.  Redness seems to be improving left hip.  Patient admitted with cellulitis.  Objective: Vitals:   06/24/21 0548 06/24/21 0834  BP: 121/71 113/61  Pulse: 70 68  Resp: 16 20  Temp: 98.1 F (36.7 C) 97.6 F (36.4 C)  SpO2: 100% 100%    Intake/Output Summary (Last 24 hours) at 06/24/2021 1315 Last data filed at 06/24/2021 0457 Gross per 24 hour  Intake --  Output 1380 ml  Net -1380 ml   Filed Weights   06/22/21 0119  Weight: 98.9 kg    ROS: Review of Systems  Respiratory:  Negative for shortness of breath.   Cardiovascular:  Negative for chest pain.  Gastrointestinal:  Negative for abdominal pain, nausea and vomiting.  Exam: Physical Exam HENT:     Head: Normocephalic.     Mouth/Throat:     Pharynx: No oropharyngeal exudate.  Eyes:     General: Lids are normal.     Conjunctiva/sclera: Conjunctivae normal.  Cardiovascular:     Rate and Rhythm: Normal rate and regular rhythm.     Heart sounds: Normal heart sounds, S1 normal and S2 normal.  Pulmonary:     Breath sounds: No decreased breath sounds, wheezing, rhonchi or rales.  Abdominal:     Palpations: Abdomen is soft.     Tenderness: There is no abdominal tenderness.  Musculoskeletal:     Right lower leg: Swelling present.     Left lower leg: Swelling present.  Skin:    General: Skin is warm.     Findings: No rash.     Comments: Erythema fading left hip.  Neurological:     Mental Status: He is alert.     Comments: Patient answers questions appropriately.      Scheduled Meds:  apixaban  5 mg Oral BID   Chlorhexidine Gluconate Cloth  6 each Topical Daily   doxycycline  100 mg Oral Q12H   insulin aspart  0-20 Units Subcutaneous TID WC   insulin aspart  0-5  Units Subcutaneous QHS   memantine  10 mg Oral BID   Oxcarbazepine  300 mg Oral BID   QUEtiapine  100 mg Oral QHS   vitamin B-12  3,000 mcg Oral Daily   Continuous Infusions:  piperacillin-tazobactam (ZOSYN)  IV 3.375 g (06/24/21 0534)    Assessment/Plan:  Clinical sepsis present on admission.  Cellulitis.  Patient does have a chronic osteomyelitis with CT scan.  Urine culture growing out Pseudomonas but likely contamination.  Foley catheter change.  Change vancomycin to doxycycline.  Continue Zosyn for now.  Hopefully discharge tomorrow. Acute kidney injury on chronic kidney disease stage IIIb.  Creatinine 1.92 on presentation Baseline creatinine 1.69.  Today's creatinine 1.88.  Likely new baseline. Stage IV sacral decubiti, present on admission.  Wet-to-dry dressings Recurrent DVT on Eliquis Traumatic brain injury bipolar disorder on oxcarbazepine and Seroquel Type 2 diabetes mellitus.  Last hemoglobin A1c 6.9.  On sliding scale insulin. PT recommending home health       Code Status:     Code Status Orders  (From admission, onward)           Start     Ordered   06/22/21 0402  Full code  Continuous        06/22/21 0402  Code Status History     Date Active Date Inactive Code Status Order ID Comments User Context   02/23/2021 1651 03/03/2021 2300 Full Code [id number]  Wyvonnia Dusky, MD ED   03/18/2020 1553 03/22/2020 2055 Full Code [id number]  Collier Bullock, MD ED   01/16/2020 1840 01/19/2020 1945 Full Code [id number]  Ivor Costa, MD Inpatient   01/02/2016 2050 01/04/2016 2147 Full Code [id number]  Baxter Hire, MD Inpatient      Family Communication: Updated patient's wife on the phone Disposition Plan: Status is: Inpatient, hopefully discharge tomorrow  Antibiotics: Zosyn and doxycycline  Brandolyn Shortridge [employer]  [facility]

## 2021-06-25 LAB — GLUCOSE, CAPILLARY
Glucose-Capillary: 144 mg/dL — ABNORMAL HIGH (ref 70–99)
Glucose-Capillary: 161 mg/dL — ABNORMAL HIGH (ref 70–99)

## 2021-06-25 LAB — CREATININE, SERUM
Creatinine, Ser: 2.15 mg/dL — ABNORMAL HIGH (ref 0.61–1.24)
GFR, Estimated: 33 mL/min — ABNORMAL LOW (ref >60)

## 2021-06-25 MED ORDER — QUETIAPINE FUMARATE 50 MG PO TABS: 50.0000 mg | ORAL_TABLET | Freq: Every day | ORAL | 0 refills | Status: DC | PRN

## 2021-06-25 MED ORDER — GLIPIZIDE ER 2.5 MG PO TB24: 2.5000 mg | ORAL_TABLET | Freq: Every day | ORAL | 0 refills | Status: DC

## 2021-06-25 MED ORDER — SODIUM CHLORIDE 0.9 % IV BOLUS
250.0000 mL | Freq: Once | INTRAVENOUS | Status: AC
  Administered 2021-06-25: 08:00:00 250 mL via INTRAVENOUS

## 2021-06-25 MED ORDER — CEFADROXIL 500 MG PO CAPS
500.0000 mg | ORAL_CAPSULE | Freq: Two times a day (BID) | ORAL | 0 refills | Status: AC
Start: 2021-06-25 — End: 2021-06-30

## 2021-06-25 MED ORDER — CEFADROXIL 500 MG PO CAPS
500.0000 mg | ORAL_CAPSULE | Freq: Two times a day (BID) | ORAL | Status: DC
  Administered 2021-06-25: 12:00:00 500 mg via ORAL
  Filled 2021-06-25 (×2): qty 1

## 2021-06-25 NOTE — Progress Notes (Signed)
Patient and spouse was given verbal and written discharge instructions, acknowledge understanding, states they will keep all appointments, chronic foley was left in place, patient taken to car by wheel chair, no distress when leaving the floor.

## 2021-06-25 NOTE — Plan of Care (Signed)
?  Problem: Education: ?Goal: Knowledge of General Education information will improve ?Description: Including pain rating scale, medication(s)/side effects and non-pharmacologic comfort measures ?Outcome: Progressing ?  ?Problem: Health Behavior/Discharge Planning: ?Goal: Ability to manage health-related needs will improve ?Outcome: Progressing ?  ?Problem: Clinical Measurements: ?Goal: Ability to maintain clinical measurements within normal limits will improve ?Outcome: Progressing ?  ?Problem: Clinical Measurements: ?Goal: Respiratory complications will improve ?Outcome: Progressing ?  ?Problem: Clinical Measurements: ?Goal: Cardiovascular complication will be avoided ?Outcome: Progressing ?  ?

## 2021-06-25 NOTE — Discharge Summary (Signed)
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)   Type II diabetes mellitus with renal manifestations (HCC)   Sepsis ([place])   Acute kidney injury superimposed on chronic kidney disease (HCC)   Pressure injury of sacral region, stage 4 (HCC)   Sacral osteomyelitis ([place])   Osteomyelitis of vertebra, sacral and sacrococcygeal region [facility])   UTI (urinary tract infection)   Ulcer of right heel and midfoot with fat layer exposed ([place])   Chronic indwelling Foley catheter   Colostomy in place [facility])   SECONDARY DIAGNOSIS:   Past Medical History:  Diagnosis Date   Bipolar disorder ([place])    Diabetes mellitus without complication ([place])    History of blood clots    Hypertension    TBI (traumatic brain injury)     HOSPITAL COURSE:   Clinical sepsis, present on admission with cellulitis.  Initially patient had a fever of 100.2, leukocytosis with a white blood cell count of 15.  Upon discharge erythema of the hip has improved.  White blood cell count normalized and no fever.  The patient does have chronic osteomyelitis as seen on CT scan.  The urine culture grew out only 30,000 colonies of Pseudomonas and is a contamination.  Foley catheter was changed during the hospital course.  Patient was initially on vancomycin and Zosyn.  Case discussed with infectious  disease specialist and no need for treatment for chronic osteomyelitis or the Pseudomonas.  Discharged home on cefadroxil 500 mg twice a day for 5 days Acute kidney injury on chronic kidney disease stage IIIb.  Creatinine 1.92 on presentation.  Creatinine 2.15 upon disposition.  A few months ago creatinine was 1.69.  Will need a repeat check of creatinine in follow-up appointment.  Stage IV sacral decubiti, present on admission.  We have been using wet-to-dry dressings but wound care recommended packing sacral wound with Aquacel Kellie Simmering [phone]) daily.  No signs of infection. Current DVT on Eliquis Traumatic brain injury, bipolar disorder on oxcarbazepine and Seroquel Type 2 diabetes mellitus with CKD stage IIIb.  Last hemoglobin A1c 6.9.  Patient on sliding scale here.  We will decrease the amount of glipizide XL that he takes down to 2.5 mg instead of 10 mg.  Follow-up as outpatient. Physical therapy recommending home with home health  DISCHARGE CONDITIONS:   Satisfactory  CONSULTS OBTAINED:  Infectious disease Wound care nurse  DRUG ALLERGIES:  No Known Allergies  DISCHARGE MEDICATIONS:   Allergies as of 06/25/2021   No Known Allergies      Medication List     STOP taking these medications    amLODipine 5 MG tablet Commonly known as: NORVASC       TAKE these medications    cefadroxil 500 MG capsule Commonly known as: DURICEF Take 1 capsule (500 mg total) by mouth 2 (two) times daily for 5 days.   Eliquis 5 MG Tabs tablet Generic drug:  apixaban Take 5 mg by mouth 2 (two) times daily.   glipiZIDE 2.5 MG 24 hr tablet Commonly known as: GLUCOTROL XL Take 1 tablet (2.5 mg total) by mouth daily with breakfast. What changed:  medication strength how much to take   memantine 10 MG tablet Commonly known as: NAMENDA Take 10 mg by mouth 2 (two) times daily.   Omega-3 1000 MG Caps Take by mouth.   Oxcarbazepine 300 MG tablet Commonly known as: TRILEPTAL Take 1  tablet (300 mg total) by mouth 2 (two) times daily.   QUEtiapine 100 MG tablet Commonly known as: SEROQUEL Take 1 tablet (100 mg total) by mouth at bedtime. What changed: Another medication with the same name was changed. Make sure you understand how and when to take each.   QUEtiapine 50 MG tablet Commonly known as: SEROQUEL Take 1 tablet (50 mg total) by mouth daily as needed (agitation). What changed: See the new instructions.   rosuvastatin 20 MG tablet Commonly known as: CRESTOR Take 20 mg by mouth daily.   vitamin B-12 1000 MCG tablet Commonly known as: CYANOCOBALAMIN Take 3,000 mcg by mouth daily.               Durable Medical Equipment  (From admission, onward)           Start     Ordered   06/25/21 0835  For home use only DME Other see comment  Once       Comments: Harrel Lemon lift  Question:  Length of Need  Answer:  Lifetime   06/25/21 0834             DISCHARGE INSTRUCTIONS:  Follow-up PMD 5 days   If you experience worsening of your admission symptoms, develop shortness of breath, life threatening emergency, suicidal or homicidal thoughts you must seek medical attention immediately by calling 911 or calling your MD immediately  if symptoms less severe.  You Must read complete instructions/literature along with all the possible adverse reactions/side effects for all the Medicines you take and that have been prescribed to you. Take any new Medicines after you have completely understood and accept all the possible adverse reactions/side effects.   Please note  You were cared for by a hospitalist during your hospital stay. If you have any questions about your discharge medications or the care you received while you were in the hospital after you are discharged, you can call the unit and asked to speak with the hospitalist on call if the hospitalist that took care of you is not available. Once you are discharged, your primary care physician will handle any  further medical issues. Please note that NO REFILLS for any discharge medications will be authorized once you are discharged, as it is imperative that you return to your primary care physician (or establish a relationship with a primary care physician if you do not have one) for your aftercare needs so that they can reassess your need for medications and monitor your lab values.    Today   CHIEF COMPLAINT:   Chief Complaint  Patient presents with   Cellulitis   Code Sepsis    HISTORY OF PRESENT ILLNESS:  Rafiel Doster  is a 65 y.o. male was admitted with cellulitis and treated sepsis.   VITAL SIGNS:  Blood pressure (!) 143/83, pulse 66, temperature (!) 97.5 F (36.4 C), resp. rate 19, height 5\' 6"  (1.676 m), weight 98.9 kg, SpO2 99 %.  I/O:   Intake/Output Summary (Last 24 hours)  at 06/25/2021 1558 Last data filed at 06/25/2021 1413 Gross per 24 hour  Intake 386.22 ml  Output --  Net 386.22 ml    PHYSICAL EXAMINATION:  GENERAL:  65 y.o.-year-old patient lying in the bed with no acute distress.  EYES: Pupils equal, round, reactive to light and accommodation. No scleral icterus. Extraocular muscles intact.  HEENT: Head atraumatic, normocephalic. Oropharynx and nasopharynx clear.  NECK:  Supple, no jugular venous distention. No thyroid enlargement, no tenderness.  LUNGS: Normal breath sounds bilaterally, no wheezing, rales,rhonchi or crepitation. No use of accessory muscles of respiration.  CARDIOVASCULAR: S1, S2 normal. No murmurs, rubs, or gallops.  ABDOMEN: Soft, non-tender, non-distended. Bowel sounds present. No organomegaly or mass.  EXTREMITIES: No pedal edema, cyanosis, or clubbing.  NEUROLOGIC: Cranial nerves II through XII are intact. Muscle strength 5/5 in all extremities. Sensation intact. Gait not checked.  PSYCHIATRIC: The patient is alert and oriented x 3.  SKIN: No obvious rash, lesion, or ulcer.   DATA REVIEW:   CBC Recent Labs  Lab  06/24/21 0527  WBC 6.0  HGB 8.7*  HCT 27.6*  PLT 241    Chemistries  Recent Labs  Lab 06/22/21 0046 06/23/21 0555 06/24/21 0527 06/25/21 0439  NA 127*  --  132*  --   K 4.2  --  3.9  --   CL 99  --  104  --   CO2 22  --  21*  --   GLUCOSE 256*  --  178*  --   BUN 24*  --  19  --   CREATININE 1.92*   < > 1.88* 2.15*  CALCIUM 8.1*  --  8.6*  --   AST 17  --   --   --   ALT 14  --   --   --   ALKPHOS 88  --   --   --   BILITOT 0.5  --   --   --    < > = values in this interval not displayed.     Microbiology Results  Results for orders placed or performed during the hospital encounter of 06/22/21  Resp Panel by RT-PCR (Flu A&B, Covid) Nasopharyngeal Swab     Status: None   Collection Time: 06/22/21 12:46 AM   Specimen: Nasopharyngeal Swab; Nasopharyngeal(NP) swabs in vial transport medium  Result Value Ref Range Status   SARS Coronavirus 2 by RT PCR NEGATIVE NEGATIVE Final    Comment: (NOTE) SARS-CoV-2 target nucleic acids are NOT DETECTED.  The SARS-CoV-2 RNA is generally detectable in upper respiratory specimens during the acute phase of infection. The lowest concentration of SARS-CoV-2 viral copies this assay can detect is 138 copies/mL. A negative result does not preclude SARS-Cov-2 infection and should not be used as the sole basis for treatment or other patient management decisions. A negative result may occur with  improper specimen collection/handling, submission of specimen other than nasopharyngeal swab, presence of viral mutation(s) within the areas targeted by this assay, and inadequate number of viral copies(<138 copies/mL). A negative result must be combined with clinical observations, patient history, and epidemiological information. The expected result is Negative.  Fact Sheet for Patients:  EntrepreneurPulse.com.au  Fact Sheet for Healthcare Providers:  IncredibleEmployment.be  This test is no t yet approved  or cleared by the [place] FDA and  has been authorized for detection and/or diagnosis of SARS-CoV-2 by FDA under an Emergency Use Authorization (EUA). This EUA will remain  in effect (meaning this test can  be used) for the duration of the COVID-19 declaration under Section 564(b)(1) of the Act, 21 U.S.C.section 360bbb-3(b)(1), unless the authorization is terminated  or revoked sooner.       Influenza A by PCR NEGATIVE NEGATIVE Final   Influenza B by PCR NEGATIVE NEGATIVE Final    Comment: (NOTE) The Xpert Xpress SARS-CoV-2/FLU/RSV plus assay is intended as an aid in the diagnosis of influenza from Nasopharyngeal swab specimens and should not be used as a sole basis for treatment. Nasal washings and aspirates are unacceptable for Xpert Xpress SARS-CoV-2/FLU/RSV testing.  Fact Sheet for Patients: BloggerCourse.com  Fact Sheet for Healthcare Providers: SeriousBroker.it  This test is not yet approved or cleared by the [place] FDA and has been authorized for detection and/or diagnosis of SARS-CoV-2 by FDA under an Emergency Use Authorization (EUA). This EUA will remain in effect (meaning this test can be used) for the duration of the COVID-19 declaration under Section 564(b)(1) of the Act, 21 U.S.C. section 360bbb-3(b)(1), unless the authorization is terminated or revoked.  Performed at [facility], [address] [place], [place] [address]   Blood Culture (routine x 2)     Status: None (Preliminary result)   Collection Time: 06/22/21 12:46 AM   Specimen: BLOOD  Result Value Ref Range Status   Specimen Description BLOOD LEFT FOREARM  Final   Special Requests   Final    BOTTLES DRAWN AEROBIC AND ANAEROBIC Blood Culture results may not be optimal due to an inadequate volume of blood received in culture bottles   Culture   Final    NO GROWTH 3 DAYS Performed at [facility], [address]  [place], [place] [address]    Report Status PENDING  Incomplete  Blood Culture (routine x 2)     Status: None (Preliminary result)   Collection Time: 06/22/21 12:46 AM   Specimen: BLOOD  Result Value Ref Range Status   Specimen Description BLOOD LEFT ASSIST CONTROL  Final   Special Requests   Final    BOTTLES DRAWN AEROBIC AND ANAEROBIC Blood Culture adequate volume   Culture   Final    NO GROWTH 3 DAYS Performed at [facility], [address] [place], [place] [address]    Report Status PENDING  Incomplete  Respiratory (~20 pathogens) panel by PCR     Status: None   Collection Time: 06/22/21 12:46 AM   Specimen: Nasopharyngeal Swab; Respiratory  Result Value Ref Range Status   Adenovirus NOT DETECTED NOT DETECTED Final   Coronavirus 229E NOT DETECTED NOT DETECTED Final    Comment: (NOTE) The Coronavirus on the Respiratory Panel, DOES NOT test for the novel  Coronavirus (2019 nCoV)    Coronavirus HKU1 NOT DETECTED NOT DETECTED Final   Coronavirus NL63 NOT DETECTED NOT DETECTED Final   Coronavirus OC43 NOT DETECTED NOT DETECTED Final   Metapneumovirus NOT DETECTED NOT DETECTED Final   Rhinovirus / Enterovirus NOT DETECTED NOT DETECTED Final   Influenza A NOT DETECTED NOT DETECTED Final   Influenza B NOT DETECTED NOT DETECTED Final   Parainfluenza Virus 1 NOT DETECTED NOT DETECTED Final   Parainfluenza Virus 2 NOT DETECTED NOT DETECTED Final   Parainfluenza Virus 3 NOT DETECTED NOT DETECTED Final   Parainfluenza Virus 4 NOT DETECTED NOT DETECTED Final   Respiratory Syncytial Virus NOT DETECTED NOT DETECTED Final   Bordetella pertussis NOT DETECTED NOT DETECTED Final   Bordetella Parapertussis NOT DETECTED NOT DETECTED Final   Chlamydophila pneumoniae NOT DETECTED NOT  DETECTED Final   Mycoplasma pneumoniae NOT DETECTED NOT DETECTED Final    Comment: Performed at [facility], [address] [place], [place] [address]  Urine Culture     Status: Abnormal   Collection  Time: 06/22/21  2:00 AM   Specimen: Urine, Random  Result Value Ref Range Status   Specimen Description   Final    URINE, RANDOM Performed at [facility], [address], [place], [place] [address]    Special Requests   Final    NONE Performed at [facility], [address] [place], [place] [address]    Culture 30,000 COLONIES/mL PSEUDOMONAS AERUGINOSA (A)  Final   Report Status 06/24/2021 FINAL  Final   Organism ID, Bacteria PSEUDOMONAS AERUGINOSA (A)  Final      Susceptibility   Pseudomonas aeruginosa - MIC*    CEFTAZIDIME 4 SENSITIVE Sensitive     CIPROFLOXACIN <=0.25 SENSITIVE Sensitive     GENTAMICIN 4 SENSITIVE Sensitive     IMIPENEM 1 SENSITIVE Sensitive     PIP/TAZO 8 SENSITIVE Sensitive     * 30,000 COLONIES/mL PSEUDOMONAS AERUGINOSA      Management plans discussed with the patient, family and they are in agreement.  CODE STATUS:     Code Status Orders  (From admission, onward)           Start     Ordered   06/22/21 0402  Full code  Continuous        06/22/21 0402           Code Status History     Date Active Date Inactive Code Status Order ID Comments User Context   02/23/2021 1651 03/03/2021 2300 Full Code [id number]  Charise Killian, MD ED   03/18/2020 1553 03/22/2020 2055 Full Code [id number]  Lucile Shutters, MD ED   01/16/2020 1840 01/19/2020 1945 Full Code [id number]  Lorretta Harp, MD Inpatient   01/02/2016 2050 01/04/2016 2147 Full Code [id number]  Gracelyn Nurse, MD Inpatient       TOTAL TIME TAKING CARE OF THIS PATIENT: 35 minutes.    Alford Highland M.D on 06/25/2021 at 3:58 PM    [facility]  CC: Primary care physician; Dione Booze, MD

## 2021-06-25 NOTE — Consult Note (Addendum)
WOC Nurse Consult Note: Reason for Consult: Consult requested for sacrum and right heel.  Pt is familiar to [facility] team from previous admission on 10/17.  Pt has been treated in the past for chronic osteomyelitis with systemic antibiotics.  Wound type: Sacrum with chronic Stage 4 pressure injury; 4X6X6cm, bone palpable when probed with a swab, 100% red and moist, mod amt tan drainage.  Right heel with healing Stage 3 pressure injury; 2X1X.1cm, pink and dry; no odor, drainage, or fluctuance.  Pressure Injury POA: Yes Dressing procedure/placement/frequency: Topical treatment orders provided for bedside nurses to perform as follows to absorb drainage and provide antimicrobial benefits:  Float heels to reduce pressure.  1. Pack sacrum wound with Aquacel [place] # [phone]) Q day, using swab to fill, then cover with foam dressing.  (Change foam dressing Q 3 days or PRN soiling.) 2. Foam dressing to right heel, change Q 3 days or PRN soiling. Please re-consult if further assistance is needed.  Thank-you,  Cammie Mcgee MSN, RN, CWOCN, [place], CNS [phone]

## 2021-06-25 NOTE — TOC Initial Note (Signed)
Transition of Care [facility]) - Initial/Assessment Note    Patient Details  Name: [name] MRN: [id number] Date of Birth: 05/29/1956  Transition of [facility]) CM/SW Contact:    Conception Oms, RN Phone Number: 06/25/2021, 8:36 AM  Clinical Narrative:        Spoke with Wife [name] on the phone, She is the caregiver for the patient, she provides transportation, They have a hospital bed , Wheelchair, Rolling walker, he has a foley and Ostomy Bag,          The will benefit from a [place] lift, Sent the request to [employer] intake to be delivered to the home Jan 3rd per wife's request She stated that they are already set up with [facility] for [facility] and would like to continue, notified Malachy Mood with [facility], The wife stated that they are going out of town to visit family and will return Jan 2nd No additional needs            Patient Goals and CMS Choice        Expected Discharge Plan and Services                                                Prior Living Arrangements/Services                       Activities of Daily Living Home Assistive Devices/Equipment: Ostomy supplies, Environmental consultant (specify type), Wheelchair ADL Screening (condition at time of admission) Patient's cognitive ability adequate to safely complete daily activities?: Yes Is the patient deaf or have difficulty hearing?: No Does the patient have difficulty seeing, even when wearing glasses/contacts?: No Does the patient have difficulty concentrating, remembering, or making decisions?: No Patient able to express need for assistance with ADLs?: Yes Does the patient have difficulty dressing or bathing?: Yes Independently performs ADLs?: No Communication: Independent Dressing (OT): Dependent Grooming: Needs assistance Feeding: Needs assistance Bathing: Dependent Toileting: Dependent In/Out Bed: Dependent Walks in Home: Independent with device (comment) (per pt) Does the patient have  difficulty walking or climbing stairs?: Yes Weakness of Legs: Both Weakness of Arms/Hands: None  Permission Sought/Granted                  Emotional Assessment              Admission diagnosis:  Cellulitis [L03.90] Cellulitis of lower back [L03.312] 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] Patient Active Problem List   Diagnosis Date Noted   Chronic indwelling Foley catheter 06/22/2021   Colostomy in place [facility]) 06/22/2021   TBI (traumatic brain injury)    Ulcer of right heel and midfoot with fat layer exposed ([place]) 04/14/2021   UTI (urinary tract infection) 02/23/2021   Osteomyelitis of vertebra, sacral and sacrococcygeal region [facility]) 10/18/2020   Hyponatremia 07/07/2020   Encephalopathy 07/07/2020   Chronic multifocal osteomyelitis of right foot ([place]) 07/07/2020   Anemia 07/07/2020   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[place]) 06/01/2020   Sacral osteomyelitis ([place]) 04/13/2020   Urinary retention 03/19/2020   History of DVT (deep vein thrombosis) 03/19/2020   Pressure injury of sacral region, stage 4 ([place]) 03/19/2020   Severe sepsis with septic shock ([place]) 03/18/2020   Obesity, Class III, BMI 40-49.9 (morbid obesity) ([place]) 03/18/2020   Cellulitis 03/18/2020   Acute cystitis without hematuria    Chronic  kidney disease (CKD), stage III (moderate) (HCC) 01/20/2020   Pressure injury of skin 01/17/2020   Sepsis (HCC) 01/16/2020   Cellulitis of sacral region 01/16/2020   Acute kidney injury superimposed on chronic kidney disease (HCC) 01/16/2020   Bipolar disorder, in full remission, most recent episode mixed (HCC) 12/29/2019   High risk medication use 10/25/2019   Noncompliance with treatment plan 10/25/2019   Bipolar I disorder, most recent episode mixed (HCC) 01/07/2019   GAD (generalized anxiety disorder) 01/07/2019   Insomnia due to medical condition 01/07/2019   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)  03/06/2016   Bipolar disorder (HCC) 01/03/2016   Lithium toxicity 01/02/2016   Chronic anticoagulation 07/06/2014   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 [place] [address] Phone: [phone] Fax: [phone]     Social Determinants of Health (SDOH) Interventions    Readmission Risk Interventions Readmission Risk Prevention Plan 04/15/2021  Transportation Screening Complete  PCP or Specialist Appt within 3-5 Days Complete  HRI or Home Care Consult Complete  Social Work Consult for Recovery Care Planning/Counseling Complete  Palliative Care Screening Not Applicable  Medication Review Ocean[facility]) Complete  Some recent data might be hidden

## 2021-06-27 LAB — CULTURE, BLOOD (ROUTINE X 2)
Culture: NO GROWTH
Culture: NO GROWTH
Special Requests: ADEQUATE

## 2021-07-17 ENCOUNTER — Other Ambulatory Visit: Payer: Self-pay | Admitting: Psychiatry

## 2021-07-17 DIAGNOSIS — F3178 Bipolar disorder, in full remission, most recent episode mixed: Secondary | ICD-10-CM

## 2021-07-19 ENCOUNTER — Encounter (HOSPITAL_COMMUNITY): Payer: Self-pay | Admitting: Radiology

## 2021-07-24 ENCOUNTER — Encounter: Payer: Medicare Other | Attending: Physician Assistant | Admitting: Physician Assistant

## 2021-07-24 ENCOUNTER — Other Ambulatory Visit: Payer: Self-pay

## 2021-07-24 DIAGNOSIS — L89613 Pressure ulcer of right heel, stage 3: Secondary | ICD-10-CM | POA: Insufficient documentation

## 2021-07-24 DIAGNOSIS — M6281 Muscle weakness (generalized): Secondary | ICD-10-CM | POA: Diagnosis not present

## 2021-07-24 DIAGNOSIS — Z87820 Personal history of traumatic brain injury: Secondary | ICD-10-CM | POA: Insufficient documentation

## 2021-07-24 DIAGNOSIS — L89154 Pressure ulcer of sacral region, stage 4: Secondary | ICD-10-CM | POA: Insufficient documentation

## 2021-07-24 DIAGNOSIS — Z7901 Long term (current) use of anticoagulants: Secondary | ICD-10-CM | POA: Insufficient documentation

## 2021-07-24 DIAGNOSIS — I10 Essential (primary) hypertension: Secondary | ICD-10-CM | POA: Insufficient documentation

## 2021-07-24 DIAGNOSIS — F319 Bipolar disorder, unspecified: Secondary | ICD-10-CM | POA: Diagnosis not present

## 2021-07-24 DIAGNOSIS — E11622 Type 2 diabetes mellitus with other skin ulcer: Secondary | ICD-10-CM | POA: Insufficient documentation

## 2021-07-24 NOTE — Progress Notes (Addendum)
BARON, PARMELEE ([id number]) Visit Report for 07/24/2021 Chief Complaint Document Details Patient Name: [name], [name] Date of Service: 07/24/2021 11:30 AM Medical Record Number: [id number] Patient Account Number: [id number] Date of Birth/Sex: February 05, 1956 (66 y.o. M) Treating RN: Donnamarie Poag Primary Care Provider: [name] Other Clinician: Referring Provider: [name] Treating Provider/Extender: Skipper Cliche in Treatment: 8 Information Obtained from: Patient Chief Complaint Sacral, right gluteal, and right heel ulcers Electronic Signature(s) Signed: 07/24/2021 11:39:23 AM By: [name] PA-C Entered By: [name] on 07/24/2021 11:39:22 Virden, [name] ([id number]) -------------------------------------------------------------------------------- HPI Details Patient Name: [name] Date of Service: 07/24/2021 11:30 AM Medical Record Number: [id number] Patient Account Number: [id number] Date of Birth/Sex: 03-08-1956 (66 y.o. M) Treating RN: Donnamarie Poag Primary Care Provider: [name] Other Clinician: Referring Provider: [name] Treating Provider/Extender: Skipper Cliche in Treatment: 8 History of Present Illness HPI Description: 05/29/2021 this is a patient who presents today for initial inspection here in the clinic concerning wounds that he has over the right heel and the sacral region. Unfortunately the sacral wound is starting to spread off to the right gluteal location due to how he sits always leaning towards the right side in his chair. His wife is present she is the primary caregiver though she is not home with him all the time she does have to work. She does do an excellent job however it appears to be in trying to keep things under good control for him. The patient is not able to change positions himself nor walk by himself so he is pretty much at the mercy of the position he is putting when she is  gone and this tends to be his chair which she sits and most of the day. Obviously this I think is the main culprit for what is going on currently. It was actually in January 2020 when the sacral wound started. It was in September 2022 when the wound started to spread more to the right gluteal location. Subsequently in August 2022 is when he had been in a skilled nursing facility and the heel started to give him trouble as well. That does not seem to be doing nearly as poorly as the sacral region. He was hospitalized in October 2022 secondary to sepsis and this was in regard to the foot and was sent to skilled nursing again he is now back at home. He did have a wound VAC for the sacral wound over the summer 2022 but being in and out of facilities this ended up getting sent back. The patient does have [facility] home health that comes out 1 time per week to help with care. His most recent hemoglobin A1c was 6.9 in August 2022. Patient's met past medical history includes generalized muscle weakness, bipolar disorder, diabetes mellitus type 2, hypertension, long-term use of anticoagulant therapy due to frequent blood clots/DVTs. He also has a history of traumatic brain injury. 07/24/2021 upon evaluation today patient appears to be doing decently well in regard to the pressure ulcer on the right heel as well as the sacral region. In general I think you are making some progress here which is great news. Overall the heel unfortunately had already closed previously when we saw him although it apparently reopened when he was working with physical therapy according to his wife. The area in the sacral region is doing well and looks clean there is still some depth here but I still think it would be difficult to wound VAC this region. His wife  does an awesome job taking care of him. Is been so long since we have seen him because he has been in the hospital to be honest. Electronic Signature(s) Signed: 07/24/2021  6:04:24 PM By: [name] PA-C Entered By: [name] on 07/24/2021 18:04:24 [place]hel, [name] ([id number]) -------------------------------------------------------------------------------- Physical Exam Details Patient Name: [name] Date of Service: 07/24/2021 11:30 AM Medical Record Number: [id number] Patient Account Number: [id number] Date of Birth/Sex: 02-20-1956 (66 y.o. M) Treating RN: Donnamarie Poag Primary Care Provider: [name] Other Clinician: Referring Provider: [name] Treating Provider/Extender: Skipper Cliche in Treatment: 8 Constitutional Obese and well-hydrated in no acute distress. Respiratory normal breathing without difficulty. Psychiatric this patient is able to make decisions and demonstrates good insight into disease process. Alert and Oriented x 3. pleasant and cooperative. Notes Upon inspection patient's wound bed actually showed signs of good granulation and epithelization at this point. Fortunately there does not appear to be any evidence of active infection locally nor systemically at this time. I think that his heel does look to be doing okay I also think that his sacral region is doing quite well. Nonetheless this is still going to take quite a bit of time in order to see this heal appropriately in the sacral region in particular. Electronic Signature(s) Signed: 07/24/2021 6:04:53 PM By: [name] PA-C Entered By: [name] on 07/24/2021 18:04:52 Levee, [name] ([id number]) -------------------------------------------------------------------------------- Physician Orders Details Patient Name: [name] Date of Service: 07/24/2021 11:30 AM Medical Record Number: [id number] Patient Account Number: [id number] Date of Birth/Sex: [birth year] (66 y.o. M) Treating RN: Donnamarie Poag Primary Care Provider: [name] Other Clinician: Referring Provider: [name] Treating  Provider/Extender: Skipper Cliche in Treatment: 8 Verbal / Phone Orders: No Diagnosis Coding ICD-10 Coding Code Description [phone] Pressure ulcer of right heel, stage 3 L89.154 Pressure ulcer of sacral region, stage 4 M62.81 Muscle weakness (generalized) F31.9 Bipolar disorder, unspecified E11.622 Type 2 diabetes mellitus with other skin ulcer I10 Essential (primary) hypertension Z79.01 Long term (current) use of anticoagulants 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 Follow-up Appointments o Return Appointment in 2 weeks. o Nurse Visit as needed [place] for wound care. May utilize formulary equivalent dressing for wound treatment orders unless otherwise specified. Home Health Nurse may visit PRN to address patientos wound care needs. - for sacrum and right heel o Scheduled days for dressing changes to be completed; exception, patient has scheduled wound care visit that day. o **Please direct any NON-WOUND related issues/requests for orders to patient's Primary Care Physician. **If current dressing causes regression in wound condition, may D/C ordered dressing product/s and apply Normal Saline Moist Dressing daily until next [place] or Other MD appointment. **Notify [facility] of regression in wound condition at [phone]. Bathing/ Shower/ Hygiene o May shower with wound dressing protected with water repellent cover or cast protector. o No tub bath. Anesthetic (Use 'Patient Medications' Section for Anesthetic Order Entry) o Lidocaine applied to wound bed Edema Control - Lymphedema / Segmental Compressive Device / Other o Elevate leg(s) parallel to the floor when sitting. o DO YOUR BEST to sleep in the bed at night. DO NOT sleep in your recliner. Long hours of sitting in a recliner leads to swelling of the legs and/or potential wounds on your backside. Non-Wound  Condition o Additional non-wound orders/instructions: - AandD to excoriated areas of gluteus for protection where adhesive isn't needed to promote  healing Off-Loading o Gel wheelchair cushion o Low air-loss mattress (Group 2) o Turn and reposition every 2 hours - keep pressure off of the sacrum and right heel wounds o Other: - PRAFO boot in bed keep pressure off of sacrum/gluteus and right heel- Additional Orders / Instructions o Follow Nutritious Diet and Increase Protein Intake Wound Treatment Wound #1 - Calcaneus Wound Laterality: Right Cleanser: Byram Ancillary Kit - 15 Day Supply 1 x Per Day/15 Days Seddon, [name] ([id number]) Discharge Instructions: Use supplies as instructed; Kit contains: (15) Saline Bullets; (15) 3x3 Gauze; 15 pr Gloves Cleanser: Normal Saline 1 x Per Day/15 Days Discharge Instructions: Wash your hands with soap and water. Remove old dressing, discard into plastic bag and place into trash. Cleanse the wound with Normal Saline prior to applying a clean dressing using gauze sponges, not tissues or cotton balls. Do not scrub or use excessive force. Pat dry using gauze sponges, not tissue or cotton balls. Cleanser: Soap and Water 1 x Per Day/15 Days Discharge Instructions: Gently cleanse wound with antibacterial soap, rinse and pat dry prior to dressing wounds Primary Dressing: Silvercel Small 2x2 (in/in) 1 x Per Day/15 Days Discharge Instructions: Apply Silvercel Small 2x2 (in/in) as instructed Secondary Dressing: Mepilex Border Flex, 4x4 (in/in) 1 x Per Day/15 Days Discharge Instructions: Apply to wound as directed. Do not cut. Wound #2 - Sacrum Cleanser: Byram Ancillary Kit - 15 Day Supply 1 x Per Day/15 Days Discharge Instructions: Use supplies as instructed; Kit contains: (15) Saline Bullets; (15) 3x3 Gauze; 15 pr Gloves Cleanser: Dakin 16 (oz) 0.25 1 x Per Day/15 Days Discharge Instructions: Use as directed. Cleanser: Normal Saline 1 x Per  TMA/26 Days Discharge Instructions: Wash your hands with soap and water. Remove old dressing, discard into plastic bag and place into trash. Cleanse the wound with Normal Saline prior to applying a clean dressing using gauze sponges, not tissues or cotton balls. Do not scrub or use excessive force. Pat dry using gauze sponges, not tissue or cotton balls. Cleanser: Soap and Water 1 x Per JFH/54 Days Discharge Instructions: Gently cleanse wound with antibacterial soap, rinse and pat dry prior to dressing wounds Primary Dressing: Gauze 1 x Per Day/15 Days Discharge Instructions: Dakins gauze-As directed: dry, moistened with saline or moistened with Dakins Solution Secondary Dressing: ABD Pad 5x9 (in/in) 1 x Per Day/15 Days Discharge Instructions: Cover with ABD pad Secured With: 45M Medipore H Soft Cloth Surgical Tape, 2x2 (in/yd) 1 x Per Day/15 Days Electronic Signature(s) Signed: 07/24/2021 4:07:35 PM By: Donnamarie Poag Signed: 07/24/2021 6:21:10 PM By: [name] PA-C Entered By: Donnamarie Poag on 07/24/2021 12:11:58 Kimberling, [name] ([id number]) -------------------------------------------------------------------------------- Problem List Details Patient Name: [name] Date of Service: 07/24/2021 11:30 AM Medical Record Number: [id number] Patient Account Number: [id number] Date of Birth/Sex: Feb 05, 1956 (66 y.o. M) Treating RN: Donnamarie Poag Primary Care Provider: [name] Other Clinician: Referring Provider: [name] Treating Provider/Extender: Skipper Cliche in Treatment: 8 Active Problems ICD-10 Encounter Code Description Active Date MDM Diagnosis L89.613 Pressure ulcer of right heel, stage 3 05/29/2021 No Yes L89.154 Pressure ulcer of sacral region, stage 4 05/29/2021 No Yes M62.81 Muscle weakness (generalized) 05/29/2021 No Yes F31.9 Bipolar disorder, unspecified 05/29/2021 No Yes E11.622 Type 2 diabetes mellitus with other skin ulcer 05/29/2021  No Yes I10 Essential (primary) hypertension 05/29/2021 No Yes Z79.01 Long term (current) use of anticoagulants 05/29/2021 No Yes 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 05/29/2021 No Yes Inactive Problems Resolved Problems Electronic Signature(s) Signed: 07/24/2021  11:39:17 AM By: [name] PA-C Entered By: [name] on 07/24/2021 11:39:16 Sloss, [name] ([id number]) -------------------------------------------------------------------------------- Progress Note Details Patient Name: [name] Date of Service: 07/24/2021 11:30 AM Medical Record Number: [id number] Patient Account Number: [id number] Date of Birth/Sex: 12/25/1955 (66 y.o. M) Treating RN: Donnamarie Poag Primary Care Provider: [name] Other Clinician: Referring Provider: [name] Treating Provider/Extender: Skipper Cliche in Treatment: 8 Subjective Chief Complaint Information obtained from Patient Sacral, right gluteal, and right heel ulcers History of Present Illness (HPI) 05/29/2021 this is a patient who presents today for initial inspection here in the clinic concerning wounds that he has over the right heel and the sacral region. Unfortunately the sacral wound is starting to spread off to the right gluteal location due to how he sits always leaning towards the right side in his chair. His wife is present she is the primary caregiver though she is not home with him all the time she does have to work. She does do an excellent job however it appears to be in trying to keep things under good control for him. The patient is not able to change positions himself nor walk by himself so he is pretty much at the mercy of the position he is putting when she is gone and this tends to be his chair which she sits and most of the day. Obviously this I think is the main culprit for what is going on currently. It was actually in January 2020 when the sacral wound started. It was in  September 2022 when the wound started to spread more to the right gluteal location. Subsequently in August 2022 is when he had been in a skilled nursing facility and the heel started to give him trouble as well. That does not seem to be doing nearly as poorly as the sacral region. He was hospitalized in October 2022 secondary to sepsis and this was in regard to the foot and was sent to skilled nursing again he is now back at home. He did have a wound VAC for the sacral wound over the summer 2022 but being in and out of facilities this ended up getting sent back. The patient does have [facility] home health that comes out 1 time per week to help with care. His most recent hemoglobin A1c was 6.9 in August 2022. Patient's met past medical history includes generalized muscle weakness, bipolar disorder, diabetes mellitus type 2, hypertension, long-term use of anticoagulant therapy due to frequent blood clots/DVTs. He also has a history of traumatic brain injury. 07/24/2021 upon evaluation today patient appears to be doing decently well in regard to the pressure ulcer on the right heel as well as the sacral region. In general I think you are making some progress here which is great news. Overall the heel unfortunately had already closed previously when we saw him although it apparently reopened when he was working with physical therapy according to his wife. The area in the sacral region is doing well and looks clean there is still some depth here but I still think it would be difficult to wound VAC this region. His wife does an awesome job taking care of him. Is been so long since we have seen him because he has been in the hospital to be honest. Objective Constitutional Obese and well-hydrated in no acute distress. Vitals Time Taken: 11:56 AM, Height: 66 in, Temperature: 97.9 F, Pulse: 96 bpm, Respiratory Rate: 16 breaths/min, Blood Pressure: 141/80 mmHg. Respiratory normal breathing without  difficulty. Psychiatric this patient is able to make decisions and demonstrates good insight into disease process. Alert and Oriented x 3. pleasant and cooperative. General Notes: Upon inspection patient's wound bed actually showed signs of good granulation and epithelization at this point. Fortunately there does not appear to be any evidence of active infection locally nor systemically at this time. I think that his heel does look to be doing okay I also think that his sacral region is doing quite well. Nonetheless this is still going to take quite a bit of time in order to see this heal appropriately in the sacral region in particular. Integumentary (Hair, Skin) Wound #1 status is Open. Original cause of wound was Gradually Appeared. The date acquired was: 02/16/2021. The wound has been in treatment 8 weeks. The wound is located on the Right Calcaneus. The wound measures 1.8cm length x 1.5cm width x 0.1cm depth; 2.121cm^2 area and 0.212cm^3 volume. There is Fat Layer (Subcutaneous Tissue) exposed. There is a medium amount of serosanguineous drainage noted. There is small (1-33%) red, pink granulation within the wound bed. There is a large (67-100%) amount of necrotic tissue within the wound bed including Eschar and Adherent Slough. BRONX, BROGDEN ([id number]) Wound #2 status is Open. Original cause of wound was Gradually Appeared. The date acquired was: 07/21/2018. The wound has been in treatment 8 weeks. The wound is located on the Sacrum. The wound measures 3.2cm length x 2cm width x 2.8cm depth; 5.027cm^2 area and 14.074cm^3 volume. There is muscle and Fat Layer (Subcutaneous Tissue) exposed. There is no tunneling or undermining noted. There is a large amount of serosanguineous drainage noted. There is large (67-100%) red, pink granulation within the wound bed. There is a small (1-33%) amount of necrotic tissue within the wound bed including Adherent Slough. 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 Nurse Visit as needed Home Health: [place]: - [place] for wound care. May utilize formulary equivalent dressing for wound treatment orders unless otherwise specified. Home Health Nurse may visit [facility] to address patient s wound care needs. - for sacrum and right heel Scheduled days for dressing changes to be completed; exception, patient has scheduled wound care visit that day. **Please direct any NON-WOUND related issues/requests for orders to patient's Primary Care Physician. **If current dressing causes regression in wound condition, may D/C ordered dressing product/s and apply Normal Saline Moist Dressing daily until next [place] or Other MD appointment. **Notify [facility] of regression in wound condition at [phone]. Bathing/ Shower/ Hygiene: May shower with wound dressing protected with water repellent cover or cast protector. No tub bath. Anesthetic (Use 'Patient Medications' Section for Anesthetic Order Entry): Lidocaine applied to wound bed Edema Control - Lymphedema / Segmental Compressive Device / Other: Elevate leg(s) parallel to the floor when sitting. DO YOUR BEST to sleep in the bed at night. DO NOT sleep in your recliner. Long hours of sitting in a recliner leads to swelling of the legs and/or potential wounds on your backside. Non-Wound Condition: Additional non-wound orders/instructions: - AandD to excoriated areas of gluteus for protection where adhesive isn't needed to promote healing Off-Loading: Gel wheelchair cushion Low  air-loss mattress (Group 2) Turn and  reposition every 2 hours - keep pressure off of the sacrum and right heel wounds Other: - PRAFO boot in bed keep pressure off of sacrum/gluteus and right heel- Additional Orders / Instructions: Follow Nutritious Diet and Increase Protein Intake WOUND #1: - Calcaneus Wound Laterality: Right Cleanser: Byram Ancillary Kit - 15 Day Supply 1 x Per Day/15 Days Discharge Instructions: Use supplies as instructed; Kit contains: (15) Saline Bullets; (15) 3x3 Gauze; 15 pr Gloves Cleanser: Normal Saline 1 x Per Day/15 Days Discharge Instructions: Wash your hands with soap and water. Remove old dressing, discard into plastic bag and place into trash. Cleanse the wound with Normal Saline prior to applying a clean dressing using gauze sponges, not tissues or cotton balls. Do not scrub or use excessive force. Pat dry using gauze sponges, not tissue or cotton balls. Cleanser: Soap and Water 1 x Per Day/15 Days Discharge Instructions: Gently cleanse wound with antibacterial soap, rinse and pat dry prior to dressing wounds Primary Dressing: Silvercel Small 2x2 (in/in) 1 x Per Day/15 Days Discharge Instructions: Apply Silvercel Small 2x2 (in/in) as instructed Secondary Dressing: Mepilex Border Flex, 4x4 (in/in) 1 x Per Day/15 Days Discharge Instructions: Apply to wound as directed. Do not cut. WOUND #2: - Sacrum Wound Laterality: Cleanser: Byram Ancillary Kit - 15 Day Supply 1 x Per TFT/73 Days Discharge Instructions: Use supplies as instructed; Kit contains: (15) Saline Bullets; (15) 3x3 Gauze; 15 pr Gloves Cleanser: Dakin 16 (oz) 0.25 1 x Per UKG/25 Days JEANNE, TERRANCE ([id number]) Discharge Instructions: Use as directed. Cleanser: Normal Saline 1 x Per EGB/15 Days Discharge Instructions: Wash your hands with soap and water. Remove old dressing, discard into plastic bag and place into trash. Cleanse the wound with Normal Saline prior to applying a clean dressing  using gauze sponges, not tissues or cotton balls. Do not scrub or use excessive force. Pat dry using gauze sponges, not tissue or cotton balls. Cleanser: Soap and Water 1 x Per Day/15 Days Discharge Instructions: Gently cleanse wound with antibacterial soap, rinse and pat dry prior to dressing wounds Primary Dressing: Gauze 1 x Per Day/15 Days Discharge Instructions: Dakins gauze-As directed: dry, moistened with saline or moistened with Dakins Solution Secondary Dressing: ABD Pad 5x9 (in/in) 1 x Per Day/15 Days Discharge Instructions: Cover with ABD pad Secured With: 9M Medipore H Soft Cloth Surgical Tape, 2x2 (in/yd) 1 x Per Day/15 Days 1. Would recommend currently that we continue with the Dakin's moistened gauze dressing for the sacral region I think that is doing quite well. 2. I am also can recommend that we have the patient continue with the silver cell for the heel. We will see patient back for reevaluation in 2 weeks here in the clinic. If anything worsens or changes patient will contact our office for additional recommendations. Electronic Signature(s) Signed: 07/24/2021 6:05:16 PM By: [name] PA-C Entered By: [name] on 07/24/2021 18:05:16 Castellanos, [name] ([id number]) -------------------------------------------------------------------------------- SuperBill Details Patient Name: [name] Date of Service: 07/24/2021 Medical Record Number: [id number] Patient Account Number: [id number] Date of Birth/Sex: 08-24-1955 (66 y.o. M) Treating RN: Donnamarie Poag Primary Care Provider: [name] Other Clinician: Referring Provider: [name] Treating Provider/Extender: Skipper Cliche in Treatment: 8 Diagnosis Coding ICD-10 Codes Code Description [phone] Pressure ulcer of right heel, stage 3 L89.154 Pressure ulcer of sacral region, stage 4 M62.81 Muscle weakness (generalized) F31.9 Bipolar disorder, unspecified E11.622 Type 2 diabetes  mellitus with other skin ulcer I10 Essential (primary) hypertension Z79.01 Long term (current) use of  anticoagulants 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 Facility Procedures CPT4 Code: [id number] Description: 99213 - WOUND CARE VISIT-LEV 3 EST PT Modifier: Quantity: 1 Physician Procedures CPT4 Code: 9122583 Description: 46219 - WC PHYS LEVEL 4 - EST PT Modifier: Quantity: 1 CPT4 Code: Description: ICD-10 Diagnosis Description L89.613 Pressure ulcer of right heel, stage 3 L89.154 Pressure ulcer of sacral region, stage 4 M62.81 Muscle weakness (generalized) F31.9 Bipolar disorder, unspecified Modifier: Quantity: Electronic Signature(s) Signed: 07/24/2021 6:05:36 PM By: [name] PA-C Previous Signature: 07/24/2021 4:07:35 PM Version By: Donnamarie Poag Entered By: [name] on 07/24/2021 18:05:36

## 2021-07-24 NOTE — Progress Notes (Addendum)
[name] ([id number]) Visit Report for 07/24/2021 Arrival Information Details Patient Name: [name], ISHLER Date of Service: 07/24/2021 11:30 AM Medical Record Number: [id number] Patient Account Number: [id number] Date of Birth/Sex: [birth year] (66 y.o. M) Treating RN: Donnamarie Poag Primary Care Makylee Sanborn: [name] Other Clinician: Referring Jacelynn Hayton: [name] Treating Adrain Nesbit/Extender: Skipper Cliche in Treatment: 8 Visit Information History Since Last Visit Added or deleted any medications: No Patient Arrived: Wheel Chair Had a fall or experienced change in No Arrival Time: 11:50 activities of daily living that may affect Accompanied By: wife risk of falls: Transfer Assistance: Manual Hospitalized since last visit: Yes Patient Identification Verified: Yes Has Dressing in Place as Prescribed: Yes Secondary Verification Process Completed: Yes Pain Present Now: No Patient Has Alerts: Yes Patient Alerts: Patient on Blood Thinner DIABETIC Eliquis Electronic Signature(s) Signed: 07/24/2021 4:07:35 PM By: Donnamarie Poag Entered By: Donnamarie Poag on 07/24/2021 11:56:42 [name] ([id number]) -------------------------------------------------------------------------------- Clinic Level of Care Assessment Details Patient Name: [name] Date of Service: 07/24/2021 11:30 AM Medical Record Number: [id number] Patient Account Number: [id number] Date of Birth/Sex: Apr 03, 1956 (66 y.o. M) Treating RN: Donnamarie Poag Primary Care Johnedward Brodrick: [name] Other Clinician: Referring Cionna Collantes: [name] Treating Honestii Marton/Extender: Skipper Cliche in Treatment: 8 Clinic Level of Care Assessment Items TOOL 4 Quantity Score []  - Use when only an EandM is performed on FOLLOW-UP visit 0 ASSESSMENTS - Nursing Assessment / Reassessment []  - Reassessment of Co-morbidities (includes updates in patient status) 0 []  - 0 Reassessment of  Adherence to Treatment Plan ASSESSMENTS - Wound and Skin Assessment / Reassessment []  - Simple Wound Assessment / Reassessment - one wound 0 X- 2 5 Complex Wound Assessment / Reassessment - multiple wounds []  - 0 Dermatologic / Skin Assessment (not related to wound area) ASSESSMENTS - Focused Assessment []  - Circumferential Edema Measurements - multi extremities 0 []  - 0 Nutritional Assessment / Counseling / Intervention []  - 0 Lower Extremity Assessment (monofilament, tuning fork, pulses) []  - 0 Peripheral Arterial Disease Assessment (using hand held doppler) ASSESSMENTS - Ostomy and/or Continence Assessment and Care []  - Incontinence Assessment and Management 0 []  - 0 Ostomy Care Assessment and Management (repouching, etc.) PROCESS - Coordination of Care X - Simple Patient / Family Education for ongoing care 1 15 []  - 0 Complex (extensive) Patient / Family Education for ongoing care []  - 0 Staff obtains Programmer, systems, Records, Test Results / Process Orders []  - 0 Staff telephones HHA, Nursing Homes / Clarify orders / etc []  - 0 Routine Transfer to another Facility (non-emergent condition) []  - 0 Routine Hospital Admission (non-emergent condition) []  - 0 New Admissions / Biomedical engineer / Ordering NPWT, Apligraf, etc. []  - 0 Emergency Hospital Admission (emergent condition) X- 1 10 Simple Discharge Coordination []  - 0 Complex (extensive) Discharge Coordination PROCESS - Special Needs []  - Pediatric / Minor Patient Management 0 []  - 0 Isolation Patient Management []  - 0 Hearing / Language / Visual special needs []  - 0 Assessment of Community assistance (transportation, D/C planning, etc.) X- 1 15 Additional assistance / Altered mentation []  - 0 Support Surface(s) Assessment (bed, cushion, seat, etc.) INTERVENTIONS - Wound Cleansing / Measurement Giambra, [place] ([id number]) []  - 0 Simple Wound Cleansing - one wound X- 2 5 Complex Wound Cleansing -  multiple wounds X- 1 5 Wound Imaging (photographs - any number of wounds) []  - 0 Wound Tracing (instead of photographs) []  - 0 Simple Wound Measurement - one wound X- 2 5 Complex Wound Measurement - multiple wounds  INTERVENTIONS - Wound Dressings X - Small Wound Dressing one or multiple wounds 2 10 $Re'[]'Gnk$  - 0 Medium Wound Dressing one or multiple wounds $RemoveBeforeD'[]'AnmcHWvNwhLKDz$  - 0 Large Wound Dressing one or multiple wounds X- 1 5 Application of Medications - topical $RemoveB'[]'hDJBbirb$  - 0 Application of Medications - injection INTERVENTIONS - Miscellaneous $RemoveBeforeD'[]'ZgnoNZDuRgGrVd$  - External ear exam 0 $Remo'[]'dlHjm$  - 0 Specimen Collection (cultures, biopsies, blood, body fluids, etc.) $RemoveBefor'[]'ujiaoPZZSWrN$  - 0 Specimen(s) / Culture(s) sent or taken to Lab for analysis X- 1 10 Patient Transfer (multiple staff / Harrel Lemon Lift / Similar devices) $RemoveBeforeDE'[]'KwFNKQgjuIkefkq$  - 0 Simple Staple / Suture removal (25 or less) $Remove'[]'zujYsjt$  - 0 Complex Staple / Suture removal (26 or more) $Remove'[]'dpyztMg$  - 0 Hypo / Hyperglycemic Management (close monitor of Blood Glucose) $RemoveBefore'[]'GrqcbIesRLiON$  - 0 Ankle / Brachial Index (ABI) - do not check if billed separately X- 1 5 Vital Signs Has the patient been seen at the hospital within the last three years: Yes Total Score: 115 Level Of Care: New/Established - Level 3 Electronic Signature(s) Signed: 07/24/2021 4:07:35 PM By: Donnamarie Poag Entered By: Donnamarie Poag on 07/24/2021 12:12:48 Evinger, Broadus [name] ([id number]) -------------------------------------------------------------------------------- Encounter Discharge Information Details Patient Name: [name] Date of Service: 07/24/2021 11:30 AM Medical Record Number: [id number] Patient Account Number: [id number] Date of Birth/Sex: [birth year] (66 y.o. M) Treating RN: Donnamarie Poag Primary Care Archit Leger: [name] Other Clinician: Referring Keoni Havey: [name] Treating Malakai Schoenherr/Extender: Skipper Cliche in Treatment: 8 Encounter Discharge Information Items Discharge Condition: Stable Ambulatory Status:  Wheelchair Discharge Destination: Home Transportation: Private Auto Accompanied By: wife Schedule Follow-up Appointment: Yes Clinical Summary of Care: Electronic Signature(s) Signed: 07/24/2021 4:07:35 PM By: Donnamarie Poag Entered By: Donnamarie Poag on 07/24/2021 12:21:48 Messmer, Broadus [name] ([id number]) -------------------------------------------------------------------------------- Lower Extremity Assessment Details Patient Name: [name] Date of Service: 07/24/2021 11:30 AM Medical Record Number: [id number] Patient Account Number: [id number] Date of Birth/Sex: [birth year] (66 y.o. M) Treating RN: Donnamarie Poag Primary Care Hazelyn Kallen: [name] Other Clinician: Referring Dareion Kneece: [name] Treating Kimba Lottes/Extender: Jeri Cos Weeks in Treatment: 8 Edema Assessment Assessed: [Left: No] [Right: Yes] Edema: [Left: N] [Right: o] Electronic Signature(s) Signed: 07/24/2021 4:07:35 PM By: Donnamarie Poag Entered By: Donnamarie Poag on 07/24/2021 12:04:45 Linson, Broadus [name] ([id number]) -------------------------------------------------------------------------------- Multi Wound Chart Details Patient Name: [name] Date of Service: 07/24/2021 11:30 AM Medical Record Number: [id number] Patient Account Number: [id number] Date of Birth/Sex: 1955/11/03 (66 y.o. M) Treating RN: Donnamarie Poag Primary Care Anajah Sterbenz: [name] Other Clinician: Referring Delrico Minehart: [name] Treating Eleri Ruben/Extender: Skipper Cliche in Treatment: 8 Vital Signs Height(in): 39 Pulse(bpm): 70 Weight(lbs): Blood Pressure(mmHg): 141/80 Body Mass Index(BMI): Temperature(F): 97.9 Respiratory Rate(breaths/min): 16 Photos: [N/A:N/A] Wound Location: Right Calcaneus Sacrum N/A Wounding Event: Gradually Appeared Gradually Appeared N/A Primary Etiology: Pressure Ulcer Pressure Ulcer N/A Secondary Etiology: Diabetic Wound/Ulcer of the Lower N/A  N/A Extremity Comorbid History: Anemia, Sleep Apnea, Coronary Anemia, Sleep Apnea, Coronary N/A Artery Disease, Deep Vein Artery Disease, Deep Vein Thrombosis, Hypertension, Type II Thrombosis, Hypertension, Type II Diabetes, History of pressure Diabetes, History of pressure wounds, Neuropathy wounds, Neuropathy Date Acquired: 02/16/2021 07/21/2018 N/A Weeks of Treatment: 8 8 N/A Wound Status: Open Open N/A Wound Recurrence: No No N/A Measurements L x W x D (cm) 1.8x1.5x0.1 3.2x2x2.8 N/A Area (cm) : 2.121 5.027 N/A Volume (cm) : 0.212 14.074 N/A % Reduction in Area: -50.00% 84.90% N/A % Reduction in Volume: -50.40% 88.30% N/A Classification: Category/Stage III Category/Stage IV N/A Exudate Amount: Medium Large N/A Exudate Type: Serosanguineous Serosanguineous N/A Exudate Color: red, brown red, brown  N/A Granulation Amount: Small (1-33%) Large (67-100%) N/A Granulation Quality: Red, Pink Red, Pink N/A Necrotic Amount: Large (67-100%) Small (1-33%) N/A Necrotic Tissue: Eschar, Adherent [place] N/A Exposed Structures: Fat Layer (Subcutaneous Tissue): Fat Layer (Subcutaneous Tissue): N/A Yes Yes Fascia: No Muscle: Yes Tendon: No Fascia: No Muscle: No Tendon: No Joint: No Joint: No Bone: No Bone: No Treatment Notes Electronic Signature(s) Signed: 07/24/2021 4:07:35 PM By: Donnamarie Poag Entered By: Donnamarie Poag on 07/24/2021 12:08:30 Raz, Broadus [name] ([id number]) Gucciardo, Broadus [name] ([id number]) -------------------------------------------------------------------------------- Multi-Disciplinary Care Plan Details Patient Name: [name] Date of Service: 07/24/2021 11:30 AM Medical Record Number: [id number] Patient Account Number: [id number] Date of Birth/Sex: [birth year] (66 y.o. M) Treating RN: Donnamarie Poag Primary Care Dalicia Kisner: [name] Other Clinician: Referring Dashaun Onstott: [name] Treating Shealeigh Dunstan/Extender: Skipper Cliche in Treatment: 8 Active Inactive Wound/Skin Impairment Nursing Diagnoses: Impaired tissue integrity Knowledge deficit related to smoking impact on wound healing Knowledge deficit related to ulceration/compromised skin integrity Goals: Patient/caregiver will verbalize understanding of skin care regimen Date Initiated: 05/29/2021 Date Inactivated: 07/24/2021 Target Resolution Date: 06/14/2021 Goal Status: Met Ulcer/skin breakdown will have a volume reduction of 30% by week 4 Date Initiated: 05/29/2021 Target Resolution Date: 06/28/2021 Goal Status: Active Ulcer/skin breakdown will have a volume reduction of 50% by week 8 Date Initiated: 05/29/2021 Target Resolution Date: 07/29/2021 Goal Status: Active Ulcer/skin breakdown will have a volume reduction of 80% by week 12 Date Initiated: 05/29/2021 Target Resolution Date: 08/28/2021 Goal Status: Active Interventions: Assess patient/caregiver ability to obtain necessary supplies Assess patient/caregiver ability to perform ulcer/skin care regimen upon admission and as needed Assess ulceration(s) every visit Notes: Electronic Signature(s) Signed: 07/24/2021 4:07:35 PM By: Donnamarie Poag Entered By: Donnamarie Poag on 07/24/2021 12:05:07 Tindol, Broadus [name] ([id number]) -------------------------------------------------------------------------------- Pain Assessment Details Patient Name: [name] Date of Service: 07/24/2021 11:30 AM Medical Record Number: [id number] Patient Account Number: [id number] Date of Birth/Sex: 05-14-1956 (66 y.o. M) Treating RN: Donnamarie Poag Primary Care Rosalin Buster: [name] Other Clinician: Referring Mariam Helbert: [name] Treating Jerriah Ines/Extender: Skipper Cliche in Treatment: 8 Active Problems Location of Pain Severity and Description of Pain Patient Has Paino No Site Locations Rate the pain. Current Pain Level: 0 Pain Management and Medication Current Pain  Management: Electronic Signature(s) Signed: 07/24/2021 4:07:35 PM By: Donnamarie Poag Entered By: Donnamarie Poag on 07/24/2021 11:57:40 Hawks, Broadus [name] ([id number]) -------------------------------------------------------------------------------- Patient/Caregiver Education Details Patient Name: [name] Date of Service: 07/24/2021 11:30 AM Medical Record Number: [id number] Patient Account Number: [id number] Date of Birth/Gender: [birth year] (66 y.o. M) Treating RN: Donnamarie Poag Primary Care Physician: [name] Other Clinician: Referring Physician: [name] Treating Physician/Extender: Skipper Cliche in Treatment: 8 Education Assessment Education Provided To: Caregiver Education Topics Provided Basic Hygiene: Nutrition: Offloading: Wound/Skin Impairment: Engineer, maintenance) Signed: 07/24/2021 4:07:35 PM By: Donnamarie Poag Entered By: Donnamarie Poag on 07/24/2021 12:20:14 Cashman, Broadus [name] ([id number]) -------------------------------------------------------------------------------- Wound Assessment Details Patient Name: [name] Date of Service: 07/24/2021 11:30 AM Medical Record Number: [id number] Patient Account Number: [id number] Date of Birth/Sex: [birth year] (66 y.o. 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 Wounding Event: Gradually Appeared Etiology: Date Acquired: 02/16/2021 Wound Open Weeks Of Treatment: 8 Status: Clustered Wound: No Comorbid Anemia, Sleep Apnea, Coronary Artery Disease, Deep History: Vein Thrombosis, Hypertension, Type II Diabetes, History of pressure wounds, Neuropathy Photos Wound Measurements  Length: (cm)  1.8 Width: (cm) 1.5 Depth: (cm) 0.1 Area: (cm) 2.121 Volume: (cm) 0.212 % Reduction in Area: -50% % Reduction in Volume: -50.4% Wound Description Classification: Category/Stage III Exudate Amount: Medium Exudate Type: Serosanguineous Exudate Color: red, brown Foul Odor After Cleansing: No Slough/Fibrino Yes Wound Bed Granulation Amount: Small (1-33%) Exposed Structure Granulation Quality: Red, Pink Fascia Exposed: No Necrotic Amount: Large (67-100%) Fat Layer (Subcutaneous Tissue) Exposed: Yes Necrotic Quality: Eschar, Adherent Slough Tendon Exposed: No Muscle Exposed: No Joint Exposed: No Bone Exposed: No Treatment Notes Wound #1 (Calcaneus) Wound Laterality: Right Cleanser Byram Ancillary Kit - 15 Day Supply Discharge Instruction: Use supplies as instructed; Kit contains: (15) Saline Bullets; (15) 3x3 Gauze; 15 pr Gloves Lewellen, Treysean ([id number]) Normal Saline Discharge Instruction: Wash your hands with soap and water. Remove old dressing, discard into plastic bag and place into trash. Cleanse the wound with Normal Saline prior to applying a clean dressing using gauze sponges, not tissues or cotton balls. Do not scrub or use excessive force. Pat dry using gauze sponges, not tissue or cotton balls. Soap and Water Discharge Instruction: Gently cleanse wound with antibacterial soap, rinse and pat dry prior to dressing wounds Peri-Wound Care Topical Primary Dressing Silvercel Small 2x2 (in/in) Discharge Instruction: Apply Silvercel Small 2x2 (in/in) as instructed Secondary Dressing Mepilex Border Flex, 4x4 (in/in) Discharge Instruction: Apply to wound as directed. Do not cut. Secured With Compression Wrap Compression Stockings Environmental education officer) Signed: 07/24/2021 4:07:35 PM By: Donnamarie Poag Entered By: Donnamarie Poag on 07/24/2021 12:03:32 Buley, Broadus [name]  ([id number]) -------------------------------------------------------------------------------- Wound Assessment Details Patient Name: [name] Date of Service: 07/24/2021 11:30 AM Medical Record Number: [id number] Patient Account Number: [id number] Date of Birth/Sex: Mar 11, 1956 (66 y.o. M) Treating RN: Donnamarie Poag Primary Care Kirstie Larsen: [name] Other Clinician: Referring Daymein Nunnery: [name] Treating Fitzpatrick Alberico/Extender: Skipper Cliche in Treatment: 8 Wound Status Wound Number: 2 Primary Pressure Ulcer Etiology: Wound Location: Sacrum Wound Open Wounding Event: Gradually Appeared Status: Date Acquired: 07/21/2018 Comorbid Anemia, Sleep Apnea, Coronary Artery Disease, Deep Vein Weeks Of Treatment: 8 History: Thrombosis, Hypertension, Type II Diabetes, History of Clustered Wound: No pressure wounds, Neuropathy Photos Wound Measurements Length: (cm) 3.2 Width: (cm) 2 Depth: (cm) 2.8 Area: (cm) 5.027 Volume: (cm) 14.074 % Reduction in Area: 84.9% % Reduction in Volume: 88.3% Tunneling: No Undermining: No Wound Description Classification: Category/Stage IV Exudate Amount: Large Exudate Type: Serosanguineous Exudate Color: red, brown Foul Odor After Cleansing: No Slough/Fibrino Yes Wound Bed Granulation Amount: Large (67-100%) Exposed Structure Granulation Quality: Red, Pink Fascia Exposed: No Necrotic Amount: Small (1-33%) Fat Layer (Subcutaneous Tissue) Exposed: Yes Necrotic Quality: Adherent Slough Tendon Exposed: No Muscle Exposed: Yes Necrosis of Muscle: No Joint Exposed: No Bone Exposed: No Treatment Notes Wound #2 (Sacrum) Cleanser Byram Ancillary Kit - 15 Day Supply Discharge Instruction: Use supplies as instructed; Kit contains: (15) Saline Bullets; (15) 3x3 Gauze; 15 pr Gloves Dakin 16 (oz) 0.25 Stapp, Kenlee ([id number]) Discharge Instruction: Use as directed. Normal Saline Discharge Instruction: Wash your hands  with soap and water. Remove old dressing, discard into plastic bag and place into trash. Cleanse the wound with Normal Saline prior to applying a clean dressing using gauze sponges, not tissues or cotton balls. Do not scrub or use excessive force. Pat dry using gauze sponges, not tissue or cotton balls. Soap and Water Discharge Instruction: Gently cleanse wound with antibacterial soap, rinse and pat dry prior to dressing wounds Peri-Wound Care Topical Primary Dressing Gauze Discharge Instruction: Dakins gauze-As directed: dry, moistened  with saline or moistened with Dakins Solution Secondary Dressing ABD Pad 5x9 (in/in) Discharge Instruction: Cover with ABD pad Secured With 19M [place] Surgical Tape, 2x2 (in/yd) Compression Wrap Compression Stockings Add-Ons Electronic Signature(s) Signed: 07/24/2021 4:07:35 PM By: Donnamarie Poag Entered By: Donnamarie Poag on 07/24/2021 12:04:01 Fickett, Broadus [name] ([id number]) -------------------------------------------------------------------------------- [facility] Details Patient Name: [name] Date of Service: 07/24/2021 11:30 AM Medical Record Number: [id number] Patient Account Number: [id number] Date of Birth/Sex: 03/31/1956 (66 y.o. M) Treating RN: Donnamarie Poag Primary Care Evellyn Tuff: [name] Other Clinician: Referring Lillyanne [name]: [name] Treating Stokely Jeancharles/Extender: Skipper Cliche in Treatment: 8 Vital Signs Time Taken: 11:56 Temperature (F): 97.9 Height (in): 66 Pulse (bpm): 96 Respiratory Rate (breaths/min): 16 Blood Pressure (mmHg): 141/80 Reference Range: 80 - 120 mg / dl Electronic Signature(s) Signed: 07/24/2021 4:07:35 PM By: Donnamarie Poag Entered ByDonnamarie Poag on 07/24/2021 11:57:28

## 2021-07-27 ENCOUNTER — Encounter: Payer: Self-pay | Admitting: Emergency Medicine

## 2021-07-27 ENCOUNTER — Inpatient Hospital Stay
Admission: EM | Admit: 2021-07-27 | Discharge: 2021-07-31 | DRG: 698 | Disposition: A | Discharge status: Home / Self Care | Payer: Medicare Other | Attending: Internal Medicine | Admitting: Internal Medicine

## 2021-07-27 ENCOUNTER — Other Ambulatory Visit: Payer: Self-pay

## 2021-07-27 DIAGNOSIS — Y846 Urinary catheterization as the cause of abnormal reaction of the patient, or of later complication, without mention of misadventure at the time of the procedure: Secondary | ICD-10-CM | POA: Diagnosis present

## 2021-07-27 DIAGNOSIS — I10 Essential (primary) hypertension: Secondary | ICD-10-CM | POA: Diagnosis present

## 2021-07-27 DIAGNOSIS — E785 Hyperlipidemia, unspecified: Secondary | ICD-10-CM | POA: Diagnosis present

## 2021-07-27 DIAGNOSIS — S069XAA Unspecified intracranial injury with loss of consciousness status unknown, initial encounter: Secondary | ICD-10-CM | POA: Diagnosis present

## 2021-07-27 DIAGNOSIS — U071 COVID-19: Secondary | ICD-10-CM | POA: Diagnosis present

## 2021-07-27 DIAGNOSIS — T83511A Infection and inflammatory reaction due to indwelling urethral catheter, initial encounter: Principal | ICD-10-CM | POA: Diagnosis present

## 2021-07-27 DIAGNOSIS — B964 Proteus (mirabilis) (morganii) as the cause of diseases classified elsewhere: Secondary | ICD-10-CM | POA: Diagnosis present

## 2021-07-27 DIAGNOSIS — Z87820 Personal history of traumatic brain injury: Secondary | ICD-10-CM

## 2021-07-27 DIAGNOSIS — R6520 Severe sepsis without septic shock: Secondary | ICD-10-CM | POA: Diagnosis present

## 2021-07-27 DIAGNOSIS — E1122 Type 2 diabetes mellitus with diabetic chronic kidney disease: Secondary | ICD-10-CM | POA: Diagnosis present

## 2021-07-27 DIAGNOSIS — D649 Anemia, unspecified: Secondary | ICD-10-CM | POA: Diagnosis present

## 2021-07-27 DIAGNOSIS — L0390 Cellulitis, unspecified: Secondary | ICD-10-CM | POA: Diagnosis not present

## 2021-07-27 DIAGNOSIS — A419 Sepsis, unspecified organism: Secondary | ICD-10-CM | POA: Diagnosis present

## 2021-07-27 DIAGNOSIS — L03319 Cellulitis of trunk, unspecified: Secondary | ICD-10-CM | POA: Diagnosis present

## 2021-07-27 DIAGNOSIS — Z978 Presence of other specified devices: Secondary | ICD-10-CM

## 2021-07-27 DIAGNOSIS — F319 Bipolar disorder, unspecified: Secondary | ICD-10-CM | POA: Diagnosis present

## 2021-07-27 DIAGNOSIS — E1169 Type 2 diabetes mellitus with other specified complication: Secondary | ICD-10-CM | POA: Diagnosis present

## 2021-07-27 DIAGNOSIS — I129 Hypertensive chronic kidney disease with stage 1 through stage 4 chronic kidney disease, or unspecified chronic kidney disease: Secondary | ICD-10-CM | POA: Diagnosis present

## 2021-07-27 DIAGNOSIS — Z818 Family history of other mental and behavioral disorders: Secondary | ICD-10-CM

## 2021-07-27 DIAGNOSIS — Z86718 Personal history of other venous thrombosis and embolism: Secondary | ICD-10-CM

## 2021-07-27 DIAGNOSIS — N390 Urinary tract infection, site not specified: Secondary | ICD-10-CM | POA: Diagnosis present

## 2021-07-27 DIAGNOSIS — E1129 Type 2 diabetes mellitus with other diabetic kidney complication: Secondary | ICD-10-CM | POA: Diagnosis present

## 2021-07-27 DIAGNOSIS — F411 Generalized anxiety disorder: Secondary | ICD-10-CM | POA: Diagnosis present

## 2021-07-27 DIAGNOSIS — Z933 Colostomy status: Secondary | ICD-10-CM

## 2021-07-27 DIAGNOSIS — N3000 Acute cystitis without hematuria: Secondary | ICD-10-CM | POA: Diagnosis present

## 2021-07-27 DIAGNOSIS — N1832 Chronic kidney disease, stage 3b: Secondary | ICD-10-CM | POA: Diagnosis present

## 2021-07-27 DIAGNOSIS — E871 Hypo-osmolality and hyponatremia: Secondary | ICD-10-CM | POA: Diagnosis present

## 2021-07-27 DIAGNOSIS — N183 Chronic kidney disease, stage 3 unspecified: Secondary | ICD-10-CM | POA: Diagnosis present

## 2021-07-27 DIAGNOSIS — L89154 Pressure ulcer of sacral region, stage 4: Secondary | ICD-10-CM | POA: Diagnosis present

## 2021-07-27 DIAGNOSIS — Z7901 Long term (current) use of anticoagulants: Secondary | ICD-10-CM

## 2021-07-27 DIAGNOSIS — M4628 Osteomyelitis of vertebra, sacral and sacrococcygeal region: Secondary | ICD-10-CM | POA: Diagnosis present

## 2021-07-27 LAB — CBC WITH DIFFERENTIAL/PLATELET
Abs Immature Granulocytes: 0.08 10*3/uL — ABNORMAL HIGH (ref 0.00–0.07)
Basophils Absolute: 0 10*3/uL (ref 0.0–0.1)
Basophils Relative: 0 %
Eosinophils Absolute: 0.2 10*3/uL (ref 0.0–0.5)
Eosinophils Relative: 1 %
HCT: 32.2 % — ABNORMAL LOW (ref 39.0–52.0)
Hemoglobin: 9.9 g/dL — ABNORMAL LOW (ref 13.0–17.0)
Immature Granulocytes: 1 %
Lymphocytes Relative: 8 %
Lymphs Abs: 1 10*3/uL (ref 0.7–4.0)
MCH: 26.1 pg (ref 26.0–34.0)
MCHC: 30.7 g/dL (ref 30.0–36.0)
MCV: 85 fL (ref 80.0–100.0)
Monocytes Absolute: 0.6 10*3/uL (ref 0.1–1.0)
Monocytes Relative: 5 %
Neutro Abs: 10.5 10*3/uL — ABNORMAL HIGH (ref 1.7–7.7)
Neutrophils Relative %: 85 %
Platelets: 214 10*3/uL (ref 150–400)
RBC: 3.79 MIL/uL — ABNORMAL LOW (ref 4.22–5.81)
RDW: 18.4 % — ABNORMAL HIGH (ref 11.5–15.5)
WBC: 12.3 10*3/uL — ABNORMAL HIGH (ref 4.0–10.5)
nRBC: 0 % (ref 0.0–0.2)

## 2021-07-27 LAB — BASIC METABOLIC PANEL
Anion gap: 9 (ref 5–15)
BUN: 27 mg/dL — ABNORMAL HIGH (ref 8–23)
CO2: 22 mmol/L (ref 22–32)
Calcium: 8.9 mg/dL (ref 8.9–10.3)
Chloride: 96 mmol/L — ABNORMAL LOW (ref 98–111)
Creatinine, Ser: 1.93 mg/dL — ABNORMAL HIGH (ref 0.61–1.24)
GFR, Estimated: 38 mL/min — ABNORMAL LOW (ref >60)
Glucose, Bld: 260 mg/dL — ABNORMAL HIGH (ref 70–99)
Potassium: 4.6 mmol/L (ref 3.5–5.1)
Sodium: 127 mmol/L — ABNORMAL LOW (ref 135–145)

## 2021-07-27 LAB — LACTIC ACID, PLASMA: Lactic Acid, Venous: 2.8 mmol/L (ref 0.5–1.9)

## 2021-07-27 NOTE — ED Notes (Signed)
1st set of blood cultures sent 

## 2021-07-27 NOTE — ED Provider Notes (Signed)
[facility] Provider Note    Event Date/Time   First MD Initiated Contact with Patient 07/27/21 2341     (approximate)   History   Rash   HPI  [name] is a 66 y.o. male with past medical history of diabetes, bipolar disorder, hypertension, TBI, history of DVT on Eliquis, history of stage IV sacral decubitus ulcer and chronic osteomyelitis who presents with a rash.  Patient notes that his wife told him that he has a red rash on his back similar to when he had cellulitis and was treated with IV antibiotics about a month ago.  He has no associated pain.  Denies fevers chills.  Denies abdominal pain.  Denies shortness of breath or chest pain.  Patient has not had fevers at home.  Of note patient has chronic osteomyelitis of the sacral region related to a sacral decubitus ulcer.  Has been seen by ID for this before and it was noted that he did not need to be treated with antibiotics for this.    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   TBI (traumatic brain injury)    Ulcer of right heel and midfoot with fat layer exposed ([place]) 04/14/2021   UTI (urinary tract infection) 02/23/2021   Osteomyelitis of vertebra, sacral and sacrococcygeal region ([place]) 10/18/2020   Hyponatremia 07/07/2020   Encephalopathy 07/07/2020   Chronic multifocal osteomyelitis of right foot ([place]) 07/07/2020   Anemia 07/07/2020   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[place]) 06/01/2020   Sacral osteomyelitis ([place]) 04/13/2020   Urinary retention 03/19/2020   History of DVT (deep vein thrombosis) 03/19/2020   Pressure injury of sacral region, stage 4  ([place]) 03/19/2020   Severe sepsis with septic shock ([place]) 03/18/2020   Obesity, Class III, BMI 40-49.9 (morbid obesity) ([place]) 03/18/2020   Cellulitis 03/18/2020   Acute cystitis without hematuria    Chronic kidney disease (CKD), stage III (moderate) ([place]) 01/20/2020   Pressure injury of skin 01/17/2020   Sepsis ([place]) 01/16/2020   Cellulitis of sacral region 01/16/2020   Acute kidney injury superimposed on chronic kidney disease ([place]) 01/16/2020   Bipolar disorder, in full remission, most recent episode mixed ([place]) 12/29/2019   High risk medication use 10/25/2019   Noncompliance with treatment plan 10/25/2019   Bipolar I disorder, most recent episode mixed ([place]) 01/07/2019   GAD (generalized anxiety disorder) 01/07/2019   Insomnia due to medical condition 01/07/2019   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 ([place]) 03/06/2016   Bipolar disorder ([place]) 01/03/2016   Lithium toxicity 01/02/2016   Chronic anticoagulation 07/06/2014   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[place]) 07/06/2014   Hyperlipidemia 07/06/2014   Hypertension 07/06/2014   Leg swelling 07/06/2014   Obesity, Class II, BMI 35-39.9, with comorbidity 07/06/2014   On medication for venous thromboembolism 07/06/2014   Sleep apnea 07/06/2014   Type II diabetes mellitus with renal manifestations ([place]) 07/06/2014   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 --      Excl.  in GC? --     Most recent vital signs: Vitals:   07/27/21 2147 07/28/21 0003  BP: 133/89 (!) 149/80  Pulse: (!) 112 89  Resp: 20 (!) 21  Temp: 98.7 F (37.1 C)   SpO2: 100% 98%     General: Awake, no distress.  Chronically ill-appearing CV:  Good peripheral perfusion.  2+ lower extremity mid bilaterally Resp:  Normal effort.  Abd:  No distention.  Soft and nontender throughout Neuro:             Awake, Alert, Oriented x 3  Other:  Large deep tracking sacral decubitus wound with pink granulation tissue and some surrounding erythema extending up to the mid back and around to the right hip, it is mildly warm, nontender not indurated   ED Results / Procedures / Treatments  Labs (all labs ordered are listed, but only abnormal results are displayed) Labs Reviewed  BASIC METABOLIC PANEL - Abnormal; Notable for the following components:      Result Value   Sodium 127 (*)    Chloride 96 (*)    Glucose, Bld 260 (*)    BUN 27 (*)    Creatinine, Ser 1.93 (*)    GFR, Estimated 38 (*)    All other components within normal limits  CBC WITH DIFFERENTIAL/PLATELET - Abnormal; Notable for the following components:   WBC 12.3 (*)    RBC 3.79 (*)    Hemoglobin 9.9 (*)    HCT 32.2 (*)    RDW 18.4 (*)    Neutro Abs 10.5 (*)    Abs Immature Granulocytes 0.08 (*)    All other components within normal limits  LACTIC ACID, PLASMA - Abnormal; Notable for the following components:   Lactic Acid, Venous 2.8 (*)    All other components within normal limits  URINALYSIS, COMPLETE (UACMP) WITH MICROSCOPIC - Abnormal; Notable for the following components:   APPearance CLOUDY (*)    Glucose, UA 100 (*)    Hgb urine dipstick MODERATE (*)    Protein, ur >300 (*)    Leukocytes,Ua LARGE (*)    Bacteria, UA RARE (*)    All other components within normal limits  CULTURE, BLOOD (ROUTINE X 2)  RESP PANEL BY RT-PCR (FLU A&B, COVID) ARPGX2  URINE CULTURE     EKG     RADIOLOGY I reviewed the CXR  which does not show any acute cardiopulmonary process; agree with radiology report     PROCEDURES:  Critical Care performed: No  Procedures  The patient is on the cardiac monitor to evaluate for evidence of arrhythmia and/or significant heart rate changes.   MEDICATIONS ORDERED IN ED: Medications  cefTRIAXone (ROCEPHIN) 1 g in sodium chloride 0.9 % 100 mL IVPB (has no administration in time range)  vancomycin (VANCOREADY) IVPB  2000 mg/400 mL (has no administration in time range)  sodium chloride 0.9 % bolus 1,000 mL (1,000 mLs Intravenous New Bag/Given 07/28/21 0014)     IMPRESSION / MDM / ASSESSMENT AND PLAN / ED COURSE  I reviewed the triage vital signs and the nursing notes.                              Differential diagnosis includes, but is not limited to, cellulitis, UTI, pneumonia  Patient is a 66 year old male with past medical history of chronic sacral osteomyelitis related to a sacral decubitus wound and recent cellulitis of the back who presents with a red rash on the back which she was told by his wife.  He was admitted for this back in December and received IV antibiotics and was seen by ID who noted that he did not need treatment for the chronic osteomyelitis.  On exam today he does have erythema that is mildly warm extending from around the sacral decubitus ulcer up to the right hip and flank.  There is no crepitus no foul odor or drainage.  The wound itself actually looks fairly well does not appear to be acutely infected but there does look like there is a surrounding cellulitis.  He is mildly tachycardic and tachypneic.  He has a leukocytosis of 12.3 and a lactate of 2.8 meeting sepsis criteria.  His chest x-ray has no infiltrate UA does have significant WBCs but patient has a chronic indwelling catheter we will send urine culture.  We will cover with ceftriaxone for nonpurulent cellulitis.  Will admit to the hospital service.  Discussed with hospitalist for admission.      FINAL CLINICAL IMPRESSION(S) / ED DIAGNOSES   Final diagnoses:  Cellulitis, unspecified cellulitis site     Rx / DC Orders   ED Discharge Orders     None        Note:  This document was prepared using Dragon voice recognition software and may include unintentional dictation errors.   Rada Hay, MD 07/28/21 [phone]

## 2021-07-27 NOTE — ED Triage Notes (Signed)
Pt presents to ER from home reports feeling a rash on his back for the past couple of days. Pt has redness and rash to lower back. Pt has an indwelling catheter in place. Denies any other symptom at present. Pt reports he is not able to walk he uses a wheel chair.

## 2021-07-28 ENCOUNTER — Encounter: Payer: Self-pay | Admitting: Radiology

## 2021-07-28 ENCOUNTER — Emergency Department: Payer: Medicare Other

## 2021-07-28 DIAGNOSIS — T83511A Infection and inflammatory reaction due to indwelling urethral catheter, initial encounter: Secondary | ICD-10-CM | POA: Diagnosis present

## 2021-07-28 DIAGNOSIS — E1169 Type 2 diabetes mellitus with other specified complication: Secondary | ICD-10-CM | POA: Diagnosis present

## 2021-07-28 DIAGNOSIS — L0390 Cellulitis, unspecified: Secondary | ICD-10-CM | POA: Diagnosis present

## 2021-07-28 DIAGNOSIS — A419 Sepsis, unspecified organism: Secondary | ICD-10-CM

## 2021-07-28 DIAGNOSIS — E1122 Type 2 diabetes mellitus with diabetic chronic kidney disease: Secondary | ICD-10-CM | POA: Diagnosis present

## 2021-07-28 DIAGNOSIS — D649 Anemia, unspecified: Secondary | ICD-10-CM | POA: Diagnosis present

## 2021-07-28 DIAGNOSIS — I10 Essential (primary) hypertension: Secondary | ICD-10-CM | POA: Diagnosis not present

## 2021-07-28 DIAGNOSIS — Z87820 Personal history of traumatic brain injury: Secondary | ICD-10-CM | POA: Diagnosis not present

## 2021-07-28 DIAGNOSIS — N390 Urinary tract infection, site not specified: Secondary | ICD-10-CM

## 2021-07-28 DIAGNOSIS — N1832 Chronic kidney disease, stage 3b: Secondary | ICD-10-CM | POA: Diagnosis present

## 2021-07-28 DIAGNOSIS — Z86718 Personal history of other venous thrombosis and embolism: Secondary | ICD-10-CM | POA: Diagnosis not present

## 2021-07-28 DIAGNOSIS — Y846 Urinary catheterization as the cause of abnormal reaction of the patient, or of later complication, without mention of misadventure at the time of the procedure: Secondary | ICD-10-CM | POA: Diagnosis present

## 2021-07-28 DIAGNOSIS — Z933 Colostomy status: Secondary | ICD-10-CM | POA: Diagnosis not present

## 2021-07-28 DIAGNOSIS — L89154 Pressure ulcer of sacral region, stage 4: Secondary | ICD-10-CM

## 2021-07-28 DIAGNOSIS — U071 COVID-19: Secondary | ICD-10-CM | POA: Diagnosis present

## 2021-07-28 DIAGNOSIS — B964 Proteus (mirabilis) (morganii) as the cause of diseases classified elsewhere: Secondary | ICD-10-CM | POA: Diagnosis present

## 2021-07-28 DIAGNOSIS — E871 Hypo-osmolality and hyponatremia: Secondary | ICD-10-CM | POA: Diagnosis present

## 2021-07-28 DIAGNOSIS — M4628 Osteomyelitis of vertebra, sacral and sacrococcygeal region: Secondary | ICD-10-CM | POA: Diagnosis present

## 2021-07-28 DIAGNOSIS — R6520 Severe sepsis without septic shock: Secondary | ICD-10-CM | POA: Diagnosis present

## 2021-07-28 DIAGNOSIS — F3170 Bipolar disorder, currently in remission, most recent episode unspecified: Secondary | ICD-10-CM | POA: Diagnosis not present

## 2021-07-28 DIAGNOSIS — N3000 Acute cystitis without hematuria: Secondary | ICD-10-CM | POA: Diagnosis present

## 2021-07-28 DIAGNOSIS — Z7901 Long term (current) use of anticoagulants: Secondary | ICD-10-CM | POA: Diagnosis not present

## 2021-07-28 DIAGNOSIS — E785 Hyperlipidemia, unspecified: Secondary | ICD-10-CM | POA: Diagnosis present

## 2021-07-28 DIAGNOSIS — Z818 Family history of other mental and behavioral disorders: Secondary | ICD-10-CM | POA: Diagnosis not present

## 2021-07-28 DIAGNOSIS — I129 Hypertensive chronic kidney disease with stage 1 through stage 4 chronic kidney disease, or unspecified chronic kidney disease: Secondary | ICD-10-CM | POA: Diagnosis present

## 2021-07-28 DIAGNOSIS — L03319 Cellulitis of trunk, unspecified: Secondary | ICD-10-CM | POA: Diagnosis not present

## 2021-07-28 DIAGNOSIS — F319 Bipolar disorder, unspecified: Secondary | ICD-10-CM | POA: Diagnosis present

## 2021-07-28 LAB — BASIC METABOLIC PANEL
Anion gap: 10 (ref 5–15)
BUN: 28 mg/dL — ABNORMAL HIGH (ref 8–23)
CO2: 22 mmol/L (ref 22–32)
Calcium: 8.4 mg/dL — ABNORMAL LOW (ref 8.9–10.3)
Chloride: 99 mmol/L (ref 98–111)
Creatinine, Ser: 1.96 mg/dL — ABNORMAL HIGH (ref 0.61–1.24)
GFR, Estimated: 37 mL/min — ABNORMAL LOW (ref >60)
Glucose, Bld: 197 mg/dL — ABNORMAL HIGH (ref 70–99)
Potassium: 4.6 mmol/L (ref 3.5–5.1)
Sodium: 131 mmol/L — ABNORMAL LOW (ref 135–145)

## 2021-07-28 LAB — URINALYSIS, COMPLETE (UACMP) WITH MICROSCOPIC
Bilirubin Urine: NEGATIVE
Glucose, UA: 100 mg/dL — AB
Ketones, ur: NEGATIVE mg/dL
Nitrite: NEGATIVE
Protein, ur: >300 mg/dL — AB
Specific Gravity, Urine: 1.02 (ref 1.005–1.030)
WBC, UA: >50 WBC/hpf (ref 0–5)
pH: 7 (ref 5.0–8.0)

## 2021-07-28 LAB — CORTISOL-AM, BLOOD: Cortisol - AM: 9.9 ug/dL (ref 6.7–22.6)

## 2021-07-28 LAB — CBC
HCT: 29.1 % — ABNORMAL LOW (ref 39.0–52.0)
Hemoglobin: 9 g/dL — ABNORMAL LOW (ref 13.0–17.0)
MCH: 26.1 pg (ref 26.0–34.0)
MCHC: 30.9 g/dL (ref 30.0–36.0)
MCV: 84.3 fL (ref 80.0–100.0)
Platelets: 196 10*3/uL (ref 150–400)
RBC: 3.45 MIL/uL — ABNORMAL LOW (ref 4.22–5.81)
RDW: 18 % — ABNORMAL HIGH (ref 11.5–15.5)
WBC: 12.5 10*3/uL — ABNORMAL HIGH (ref 4.0–10.5)
nRBC: 0 % (ref 0.0–0.2)

## 2021-07-28 LAB — RESP PANEL BY RT-PCR (FLU A&B, COVID) ARPGX2
Influenza A by PCR: NEGATIVE
Influenza B by PCR: NEGATIVE
SARS Coronavirus 2 by RT PCR: POSITIVE — AB

## 2021-07-28 LAB — PROCALCITONIN: Procalcitonin: 0.24 ng/mL

## 2021-07-28 LAB — D-DIMER, QUANTITATIVE: D-Dimer, Quant: 1.81 ug/mL-FEU — ABNORMAL HIGH (ref 0.00–0.50)

## 2021-07-28 LAB — C-REACTIVE PROTEIN: CRP: 8.8 mg/dL — ABNORMAL HIGH (ref <1.0)

## 2021-07-28 LAB — TROPONIN I (HIGH SENSITIVITY)
Troponin I (High Sensitivity): 25 ng/L — ABNORMAL HIGH (ref <18)
Troponin I (High Sensitivity): 19 ng/L — ABNORMAL HIGH (ref <18)

## 2021-07-28 LAB — LACTIC ACID, PLASMA: Lactic Acid, Venous: 1.3 mmol/L (ref 0.5–1.9)

## 2021-07-28 LAB — GLUCOSE, CAPILLARY: Glucose-Capillary: 162 mg/dL — ABNORMAL HIGH (ref 70–99)

## 2021-07-28 LAB — LACTATE DEHYDROGENASE: LDH: 191 U/L (ref 98–192)

## 2021-07-28 LAB — FIBRINOGEN: Fibrinogen: 603 mg/dL — ABNORMAL HIGH (ref 210–475)

## 2021-07-28 LAB — BRAIN NATRIURETIC PEPTIDE: B Natriuretic Peptide: 22.3 pg/mL (ref 0.0–100.0)

## 2021-07-28 LAB — PROTIME-INR
INR: 1.6 — ABNORMAL HIGH (ref 0.8–1.2)
Prothrombin Time: 18.7 seconds — ABNORMAL HIGH (ref 11.4–15.2)

## 2021-07-28 MED ORDER — QUETIAPINE FUMARATE 25 MG PO TABS: 50.0000 mg | ORAL_TABLET | Freq: Every day | ORAL | Status: DC | PRN

## 2021-07-28 MED ORDER — SODIUM CHLORIDE 0.9 % IV BOLUS
1000.0000 mL | Freq: Once | INTRAVENOUS | Status: AC
  Administered 2021-07-28: 1000 mL via INTRAVENOUS

## 2021-07-28 MED ORDER — TRAZODONE HCL 50 MG PO TABS
25.0000 mg | ORAL_TABLET | Freq: Every evening | ORAL | Status: DC | PRN
  Administered 2021-07-30: 25 mg via ORAL
  Filled 2021-07-28: qty 1

## 2021-07-28 MED ORDER — HYDROCOD POLI-CHLORPHE POLI ER 10-8 MG/5ML PO SUER: 5.0000 mL | Freq: Two times a day (BID) | ORAL | Status: DC | PRN

## 2021-07-28 MED ORDER — ONDANSETRON HCL 4 MG PO TABS: 4.0000 mg | ORAL_TABLET | Freq: Four times a day (QID) | ORAL | Status: DC | PRN

## 2021-07-28 MED ORDER — GUAIFENESIN-DM 100-10 MG/5ML PO SYRP: 10.0000 mL | ORAL_SOLUTION | ORAL | Status: DC | PRN

## 2021-07-28 MED ORDER — GUAIFENESIN ER 600 MG PO TB12
600.0000 mg | ORAL_TABLET | Freq: Two times a day (BID) | ORAL | Status: DC
  Administered 2021-07-28 – 2021-07-31 (×7): 600 mg via ORAL
  Filled 2021-07-28 (×7): qty 1

## 2021-07-28 MED ORDER — SODIUM CHLORIDE 0.9 % IV SOLN: 2.0000 g | Freq: Once | INTRAVENOUS | Status: DC

## 2021-07-28 MED ORDER — APIXABAN 5 MG PO TABS
5.0000 mg | ORAL_TABLET | Freq: Two times a day (BID) | ORAL | Status: DC
Start: 2021-07-28 — End: 2021-07-31
  Administered 2021-07-28 – 2021-07-31 (×7): 5 mg via ORAL
  Filled 2021-07-28 (×7): qty 1

## 2021-07-28 MED ORDER — SODIUM CHLORIDE 0.9 % IV SOLN: INTRAVENOUS | Status: DC

## 2021-07-28 MED ORDER — OMEGA-3-ACID ETHYL ESTERS 1 G PO CAPS
1.0000 g | ORAL_CAPSULE | Freq: Every day | ORAL | Status: DC
  Administered 2021-07-28 – 2021-07-31 (×4): 1 g via ORAL
  Filled 2021-07-28 (×5): qty 1

## 2021-07-28 MED ORDER — ONDANSETRON HCL 4 MG/2ML IJ SOLN: 4.0000 mg | Freq: Four times a day (QID) | INTRAMUSCULAR | Status: DC | PRN

## 2021-07-28 MED ORDER — OXCARBAZEPINE 300 MG PO TABS
300.0000 mg | ORAL_TABLET | Freq: Two times a day (BID) | ORAL | Status: DC
  Administered 2021-07-28 – 2021-07-31 (×7): 300 mg via ORAL
  Filled 2021-07-28 (×9): qty 1

## 2021-07-28 MED ORDER — ASCORBIC ACID 500 MG PO TABS
500.0000 mg | ORAL_TABLET | Freq: Every day | ORAL | Status: DC
  Administered 2021-07-28 – 2021-07-31 (×4): 500 mg via ORAL
  Filled 2021-07-28 (×4): qty 1

## 2021-07-28 MED ORDER — METRONIDAZOLE 500 MG/100ML IV SOLN
500.0000 mg | Freq: Two times a day (BID) | INTRAVENOUS | Status: DC
  Administered 2021-07-28 – 2021-07-30 (×5): 500 mg via INTRAVENOUS
  Filled 2021-07-28 (×6): qty 100

## 2021-07-28 MED ORDER — ACETAMINOPHEN 650 MG RE SUPP: 650.0000 mg | Freq: Four times a day (QID) | RECTAL | Status: DC | PRN

## 2021-07-28 MED ORDER — MEMANTINE HCL 5 MG PO TABS
10.0000 mg | ORAL_TABLET | Freq: Two times a day (BID) | ORAL | Status: DC
  Administered 2021-07-28 – 2021-07-31 (×7): 10 mg via ORAL
  Filled 2021-07-28 (×7): qty 2

## 2021-07-28 MED ORDER — SODIUM CHLORIDE 0.9 % IV SOLN
200.0000 mg | Freq: Once | INTRAVENOUS | Status: AC
  Administered 2021-07-28: 200 mg via INTRAVENOUS
  Filled 2021-07-28: qty 40

## 2021-07-28 MED ORDER — ROSUVASTATIN CALCIUM 10 MG PO TABS
20.0000 mg | ORAL_TABLET | Freq: Every day | ORAL | Status: DC
  Administered 2021-07-28 – 2021-07-31 (×4): 20 mg via ORAL
  Filled 2021-07-28: qty 1
  Filled 2021-07-28 (×4): qty 2

## 2021-07-28 MED ORDER — VITAMIN D 25 MCG (1000 UNIT) PO TABS
1000.0000 [IU] | ORAL_TABLET | Freq: Every day | ORAL | Status: DC
  Administered 2021-07-28 – 2021-07-31 (×4): 1000 [IU] via ORAL
  Filled 2021-07-28 (×4): qty 1

## 2021-07-28 MED ORDER — ZINC SULFATE 220 (50 ZN) MG PO CAPS
220.0000 mg | ORAL_CAPSULE | Freq: Every day | ORAL | Status: DC
  Administered 2021-07-28 – 2021-07-31 (×4): 220 mg via ORAL
  Filled 2021-07-28 (×4): qty 1

## 2021-07-28 MED ORDER — ENOXAPARIN SODIUM 40 MG/0.4ML IJ SOSY: 40.0000 mg | PREFILLED_SYRINGE | INTRAMUSCULAR | Status: DC

## 2021-07-28 MED ORDER — CEFEPIME HCL 2 G IJ SOLR
2.0000 g | Freq: Two times a day (BID) | INTRAMUSCULAR | Status: DC
  Administered 2021-07-28 – 2021-07-30 (×5): 2 g via INTRAVENOUS
  Filled 2021-07-28 (×6): qty 2

## 2021-07-28 MED ORDER — VITAMIN B-12 1000 MCG PO TABS
3000.0000 ug | ORAL_TABLET | Freq: Every day | ORAL | Status: DC
  Administered 2021-07-28 – 2021-07-31 (×4): 3000 ug via ORAL
  Filled 2021-07-28 (×5): qty 3

## 2021-07-28 MED ORDER — ACETAMINOPHEN 325 MG PO TABS: 650.0000 mg | ORAL_TABLET | Freq: Four times a day (QID) | ORAL | Status: DC | PRN

## 2021-07-28 MED ORDER — VANCOMYCIN HCL IN DEXTROSE 1-5 GM/200ML-% IV SOLN
1000.0000 mg | INTRAVENOUS | Status: DC
  Administered 2021-07-28 – 2021-07-29 (×2): 1000 mg via INTRAVENOUS
  Filled 2021-07-28 (×3): qty 200

## 2021-07-28 MED ORDER — SODIUM CHLORIDE 0.9 % IV SOLN
100.0000 mg | Freq: Every day | INTRAVENOUS | Status: DC
  Administered 2021-07-29 – 2021-07-30 (×2): 100 mg via INTRAVENOUS
  Filled 2021-07-28: qty 20
  Filled 2021-07-28: qty 100
  Filled 2021-07-28: qty 20

## 2021-07-28 MED ORDER — VANCOMYCIN HCL IN DEXTROSE 1-5 GM/200ML-% IV SOLN
1000.0000 mg | INTRAVENOUS | Status: DC
  Filled 2021-07-28: qty 200

## 2021-07-28 MED ORDER — VANCOMYCIN HCL 2000 MG/400ML IV SOLN
2000.0000 mg | Freq: Once | INTRAVENOUS | Status: AC
Start: 2021-07-28 — End: 2021-07-28
  Administered 2021-07-28: 2000 mg via INTRAVENOUS
  Filled 2021-07-28: qty 400

## 2021-07-28 MED ORDER — SODIUM CHLORIDE 0.9 % IV SOLN
1.0000 g | Freq: Once | INTRAVENOUS | Status: AC
  Administered 2021-07-28: 1 g via INTRAVENOUS
  Filled 2021-07-28: qty 10

## 2021-07-28 MED ORDER — VANCOMYCIN HCL IN DEXTROSE 1-5 GM/200ML-% IV SOLN: 1000.0000 mg | Freq: Once | INTRAVENOUS | Status: DC

## 2021-07-28 MED ORDER — MAGNESIUM HYDROXIDE 400 MG/5ML PO SUSP: 30.0000 mL | Freq: Every day | ORAL | Status: DC | PRN

## 2021-07-28 MED ORDER — QUETIAPINE FUMARATE 25 MG PO TABS
100.0000 mg | ORAL_TABLET | Freq: Every day | ORAL | Status: DC
  Administered 2021-07-28 – 2021-07-30 (×3): 100 mg via ORAL
  Filled 2021-07-28 (×2): qty 4

## 2021-07-28 NOTE — H&P (Addendum)
[place]   PATIENT NAME: [name]    MR#:  [id number]  DATE OF BIRTH:  [birth year]  DATE OF ADMISSION:  07/27/2021  PRIMARY CARE PHYSICIAN: [name], MD   Patient is coming from: Home Home  REQUESTING/REFERRING PHYSICIAN: Rada Hay, MD  CHIEF COMPLAINT:   Chief Complaint  Patient presents with   Rash    HISTORY OF PRESENT ILLNESS:  [name] is a 66 y.o. African-American male with medical history significant for bipolar disorder, type 2 diabetes mellitus, hypertension and traumatic brain injury, as well as stage IV sacral decubitus ulcer and chronic osteomyelitis, who presented to the ER with acute onset of erythematous eruption similar to when he had cellulitis and treated with IV antibiotics about a month ago.  No significantly worsening pain.  He denied any fever or chills.  No nausea or vomiting or abdominal pain.  No chest pain or palpitations.  No dysuria, oliguria or hematuria or flank pain.  ED Course: When the patient came to the ER respiratory rate was 21 and later 24 and blood pressure 149/80.  CBC showed leukocytosis at 12.3 with neutrophilia as well as anemia.  Lactic acid was 2.8.  UA was positive for UTI.  Blood culture was sent.  COVID-19 PCR came back positive.  Influenza antigens came back negative.  Imaging: Chest x-ray showed no acute cardiopulmonary disease.  The patient was given IV Rocephin and vancomycin as well as 1 L bolus of IV normal saline. PAST MEDICAL HISTORY:   Past Medical History:  Diagnosis Date   Bipolar disorder ([place])    Diabetes mellitus without complication ([place])    History of blood clots    Hypertension    TBI (traumatic brain injury)     PAST SURGICAL HISTORY:   Past Surgical History:  Procedure Laterality Date   BACK SURGERY     CARPAL TUNNEL RELEASE Bilateral    COLON SURGERY     COLOSTOMY     TONSILLECTOMY      SOCIAL HISTORY:   Social History   Tobacco Use    Smoking status: Never   Smokeless tobacco: Never  Substance Use Topics   Alcohol use: Yes    FAMILY HISTORY:   Family History  Problem Relation Age of Onset   Depression Father    Deep vein thrombosis Father     DRUG ALLERGIES:  No Known Allergies  REVIEW OF SYSTEMS:   ROS As per history of present illness. All pertinent systems were reviewed above. Constitutional, HEENT, cardiovascular, respiratory, GI, GU, musculoskeletal, neuro, psychiatric, endocrine, integumentary and hematologic systems were reviewed and are otherwise negative/unremarkable except for positive findings mentioned above in the HPI.   MEDICATIONS AT HOME:   Prior to Admission medications   Medication Sig Start Date End Date Taking? Authorizing Provider  ELIQUIS 5 MG TABS tablet Take 5 mg by mouth 2 (two) times daily.  11/17/18  Yes [provider]  glipiZIDE (GLUCOTROL XL) 2.5 MG 24 hr tablet Take 1 tablet (2.5 mg total) by mouth daily with breakfast. 06/25/21  Yes Wieting, Richard, MD  memantine (NAMENDA) 10 MG tablet Take 10 mg by mouth 2 (two) times daily.   Yes [provider]  Omega-3 1000 MG CAPS Take by mouth.   Yes [provider]  Oxcarbazepine (TRILEPTAL) 300 MG tablet Take 1 tablet (300 mg total) by mouth 2 (two) times daily. 01/11/21  Yes Eappen, Ria Clock, MD  QUEtiapine (SEROQUEL) 100 MG tablet TAKE 1  TABLET BY MOUTH AT BEDTIME 07/17/21  Yes Eappen, Ria Clock, MD  QUEtiapine (SEROQUEL) 50 MG tablet Take 1 tablet (50 mg total) by mouth daily as needed (agitation). 06/25/21  Yes Wieting, Richard, MD  rosuvastatin (CRESTOR) 20 MG tablet Take 20 mg by mouth daily.  08/29/17  Yes [provider]  vitamin B-12 (CYANOCOBALAMIN) 1000 MCG tablet Take 3,000 mcg by mouth daily.   Yes [provider]      VITAL SIGNS:  Blood pressure 131/87, pulse 83, temperature (!) 97.5 F (36.4 C), resp. rate 17, height 5\' 6"  (1.676 m), weight 98.9 kg, SpO2 100 %.  PHYSICAL  EXAMINATION:  Physical Exam  GENERAL:  66 y.o.-year-old African-American male patient lying in the bed with no acute distress.  EYES: Pupils equal, round, reactive to light and accommodation. No scleral icterus. Extraocular muscles intact.  HEENT: Head atraumatic, normocephalic. Oropharynx and nasopharynx clear.  NECK:  Supple, no jugular venous distention. No thyroid enlargement, no tenderness.  LUNGS: Normal breath sounds bilaterally, no wheezing, rales,rhonchi or crepitation. No use of accessory muscles of respiration.  CARDIOVASCULAR: Regular rate and rhythm, S1, S2 normal. No murmurs, rubs, or gallops.  ABDOMEN: Soft, nondistended, nontender. Bowel sounds present. No organomegaly or mass.  EXTREMITIES: No pedal edema, cyanosis, or clubbing.  NEUROLOGIC: Cranial nerves II through XII are intact. Muscle strength 5/5 in all extremities. Sensation intact. Gait not checked.  PSYCHIATRIC: The patient is alert and oriented x 3.  Normal affect and good eye contact. SKIN: Stage IV sacral decubitus ulcer as shown below with minimal surrounding erythema and clean base.  Marland Kitchen   LABORATORY PANEL:   CBC Recent Labs  Lab 07/27/21 2152  WBC 12.3*  HGB 9.9*  HCT 32.2*  PLT 214   ------------------------------------------------------------------------------------------------------------------  Chemistries  Recent Labs  Lab 07/27/21 2152  NA 127*  K 4.6  CL 96*  CO2 22  GLUCOSE 260*  BUN 27*  CREATININE 1.93*  CALCIUM 8.9   ------------------------------------------------------------------------------------------------------------------  Cardiac Enzymes No results for input(s): TROPONINI in the last 168 hours. ------------------------------------------------------------------------------------------------------------------  RADIOLOGY:  DG Chest Portable 1 View  Result Date: 07/28/2021 CLINICAL DATA:  Chest pain EXAM: PORTABLE CHEST 1 VIEW COMPARISON:  06/22/2021 FINDINGS: Lungs  are clear.  No pleural effusion or pneumothorax. The heart is normal in size. IMPRESSION: No evidence of acute cardiopulmonary disease. Electronically Signed   By: Julian Hy M.D.   On: 07/28/2021 00:29      IMPRESSION AND PLAN:  Principal Problem:   Sepsis due to cellulitis ([place])  1.  Sepsis likely secondary to UTI and possible sacral cellulitis surrounding a stage IV sacral decubitus ulcer.  The patient has a lactic acid that qualifies him for severe sepsis. - The patient will be admitted to a medical monitored bed. - We will continue antibiotic therapy with IV vancomycin, cefepime and Flagyl. - Pain management will be provided. - We will follow blood and wound cultures as well as urine culture. -She will be hydrated with IV normal saline.  2.  COVID-19 infection.  It could be contributing to sepsis. -The patient will be placed on IV remdesivir. - We will follow laboratory markers. - He will be given vitamin D3, vitamin C and zinc sulfate.  3.  Essential hypertension. - We will continue amlodipine  4.  Dyslipidemia. - We will continue statin therapy.  5.  Type 2 diabetes mellitus. - We will continue Glucotrol XL and place the patient on supplement coverage with NovoLog.  6.  TBI and  bipolar disorder. - We will continue Seroquel, Namenda and Trileptal.   DVT prophylaxis: Lovenox. Code Status: full code. Family Communication:  The plan of care was discussed in details with the patient (and family). I answered all questions. The patient agreed to proceed with the above mentioned plan. Further management will depend upon hospital course. Disposition Plan: Back to previous home environment Consults called: none. All the records are reviewed and case discussed with ED provider.  Status is: Inpatient  At the time of the admission, it appears that the appropriate admission status for this patient is inpatient.  This is judged to be reasonable and necessary in order to  provide the required intensity of service to ensure the patient's safety given the presenting symptoms, physical exam findings and initial radiographic and laboratory data in the context of comorbid conditions.  The patient requires inpatient status due to high intensity of service, high risk of further deterioration and high frequency of surveillance required.  I certify that at the time of admission, it is my clinical judgment that the patient will require inpatient hospital care extending more than 2 midnights.                            Dispo: The patient is from: Home              Anticipated d/c is to: Home              Patient currently is not medically stable to d/c.              Difficult to place patient: No   Christel Mormon M.D on 07/28/2021 at 5:01 AM  [facility]   From 7 PM-7 AM, contact night-coverage [email or web address]  CC: Primary care physician; [name], MD

## 2021-07-28 NOTE — Progress Notes (Signed)
Pharmacy Antibiotic Note  [name] is a 66 y.o. male admitted on 07/27/2021 with cellulitis.  Pharmacy has been consulted for Cefepime and Vancomycin dosing x 7 days.  Plan: Cefepime 2 gm q12h per indication and renal fxn.  Vancomycin Pt given initial dose of Vancomycin 2 gm Vancomycin 1000 mg IV Q 24 hrs x 7 days.  Goal AUC 400-550. Expected AUC: 516.9 SCr used: 1.93, Vd Used: 0.6, BMI 35.19  Pharmacy will continue to follow and will adjust abx dosing whenever warranted.   Height: 5\' 6"  (167.6 cm) Weight: 98.9 kg (218 lb 0.6 oz) IBW/kg (Calculated) : 63.8  Temp (24hrs), Avg:98.7 F (37.1 C), Min:98.7 F (37.1 C), Max:98.7 F (37.1 C)  Recent Labs  Lab 07/27/21 2152 07/27/21 2153  WBC 12.3*  --   CREATININE 1.93*  --   LATICACIDVEN  --  2.8*    Estimated Creatinine Clearance: 41.4 mL/min (A) (by C-G formula based on SCr of 1.93 mg/dL (H)).    No Known Allergies  Antimicrobials this admission: 1/28 Ceftriaxone >> x 1 dose 1/28 Cefepime >> x 7 days 1/28 Vancomycin >> x 7 days 1/28 Flagyl >> x 7 days  Microbiology results: 1/27 BCx: Pending 1/28 UCx: Pending   Thank you for allowing pharmacy to be a part of this patients care.  Renda Rolls, PharmD, MBA 07/28/2021 2:30 AM

## 2021-07-28 NOTE — Plan of Care (Signed)
Problem: Education: Goal: Knowledge of General Education information will improve Description: Including pain rating scale, medication(s)/side effects and non-pharmacologic comfort measures 07/28/2021 0525 by Jearld Fenton, RN Outcome: Progressing 07/28/2021 0525 by Jearld Fenton, RN Outcome: Progressing   Problem: Health Behavior/Discharge Planning: Goal: Ability to manage health-related needs will improve 07/28/2021 0525 by Jearld Fenton, RN Outcome: Progressing 07/28/2021 0525 by Jearld Fenton, RN Outcome: Progressing   Problem: Clinical Measurements: Goal: Ability to maintain clinical measurements within normal limits will improve 07/28/2021 0525 by Jearld Fenton, RN Outcome: Progressing 07/28/2021 0525 by Jearld Fenton, RN Outcome: Progressing Goal: Will remain free from infection 07/28/2021 0525 by Jearld Fenton, RN Outcome: Progressing 07/28/2021 0525 by Jearld Fenton, RN Outcome: Progressing Goal: Diagnostic test results will improve 07/28/2021 0525 by Jearld Fenton, RN Outcome: Progressing 07/28/2021 0525 by Jearld Fenton, RN Outcome: Progressing Goal: Respiratory complications will improve 07/28/2021 0525 by Jearld Fenton, RN Outcome: Progressing 07/28/2021 0525 by Jearld Fenton, RN Outcome: Progressing Goal: Cardiovascular complication will be avoided 07/28/2021 0525 by Jearld Fenton, RN Outcome: Progressing 07/28/2021 0525 by Jearld Fenton, RN Outcome: Progressing   Problem: Activity: Goal: Risk for activity intolerance will decrease 07/28/2021 0525 by Jearld Fenton, RN Outcome: Progressing 07/28/2021 0525 by Jearld Fenton, RN Outcome: Progressing   Problem: Nutrition: Goal: Adequate nutrition will be maintained 07/28/2021 0525 by Jearld Fenton, RN Outcome: Progressing 07/28/2021 0525 by Jearld Fenton, RN Outcome: Progressing   Problem: Coping: Goal: Level of anxiety will decrease 07/28/2021 0525 by Jearld Fenton, RN Outcome:  Progressing 07/28/2021 0525 by Jearld Fenton, RN Outcome: Progressing   Problem: Elimination: Goal: Will not experience complications related to bowel motility 07/28/2021 0525 by Jearld Fenton, RN Outcome: Progressing 07/28/2021 0525 by Jearld Fenton, RN Outcome: Progressing Goal: Will not experience complications related to urinary retention 07/28/2021 0525 by Jearld Fenton, RN Outcome: Progressing 07/28/2021 0525 by Jearld Fenton, RN Outcome: Progressing   Problem: Pain Managment: Goal: General experience of comfort will improve 07/28/2021 0525 by Jearld Fenton, RN Outcome: Progressing 07/28/2021 0525 by Jearld Fenton, RN Outcome: Progressing   Problem: Safety: Goal: Ability to remain free from injury will improve 07/28/2021 0525 by Jearld Fenton, RN Outcome: Progressing 07/28/2021 0525 by Jearld Fenton, RN Outcome: Progressing   Problem: Skin Integrity: Goal: Risk for impaired skin integrity will decrease 07/28/2021 0525 by Jearld Fenton, RN Outcome: Progressing 07/28/2021 0525 by Jearld Fenton, RN Outcome: Progressing   Problem: Education: Goal: Knowledge of risk factors and measures for prevention of condition will improve 07/28/2021 0525 by Jearld Fenton, RN Outcome: Progressing 07/28/2021 0525 by Jearld Fenton, RN Outcome: Progressing   Problem: Coping: Goal: Psychosocial and spiritual needs will be supported 07/28/2021 0525 by Jearld Fenton, RN Outcome: Progressing 07/28/2021 0525 by Jearld Fenton, RN Outcome: Progressing   Problem: Respiratory: Goal: Will maintain a patent airway 07/28/2021 0525 by Jearld Fenton, RN Outcome: Progressing 07/28/2021 0525 by Jearld Fenton, RN Outcome: Progressing Goal: Complications related to the disease process, condition or treatment will be avoided or minimized 07/28/2021 0525 by Jearld Fenton, RN Outcome: Progressing 07/28/2021 0525 by Jearld Fenton, RN Outcome: Progressing   Problem:  Clinical Measurements: Goal: Ability to avoid or minimize complications of infection will improve 07/28/2021 0525 by Jearld Fenton, RN Outcome: Progressing 07/28/2021 0525 by Jearld Fenton, RN Outcome: Progressing   Problem:  Skin Integrity: Goal: Skin integrity will improve 07/28/2021 0525 by Jearld Fenton, RN Outcome: Progressing 07/28/2021 0525 by Jearld Fenton, RN Outcome: Progressing

## 2021-07-28 NOTE — Progress Notes (Signed)
°  Progress Note   [name] [id number] DOB: 15-Aug-1955 DOA: 07/27/2021     0 DOS: the patient was seen and examined on 07/28/2021   Brief hospital course: Same day as admission rounding note.  Please see full H&P by Dr. Sidney Ace. I agree with the assessment and plan as outlined in H&P.  -- Check repeat lactic acid  Assessment and Plan Per H&P     Subjective: Patient awake resting in bed when seen today.  He reports having an itchy back and needing a Pensions consultant.  He otherwise says he feels about the same.  Was surprised he tested positive for COVID and not sure how he got it.  Otherwise offers no acute complaints.  Objective Vitals reviewed and notable for tachypnea with respiratory rate in the low 20s.  No fevers.  BP stable but mildly elevated overnight at 149/80.  General exam: awake, alert, no acute distress, obese HEENT: moist mucus membranes, hearing grossly normal  Respiratory system: CTAB, no wheezes, rales or rhonchi, normal respiratory effort. Cardiovascular system: normal S1/S2, RRR,  no pedal edema.   Gastrointestinal system: soft, NT, ND, no HSM felt, +bowel sounds. Central nervous system: A&O x3. no gross focal neurologic deficits, normal speech Extremities: moves all, no edema, normal tone Skin: No rash on the back or other visualized skin, dry, intact, normal temperature Psychiatry: normal mood, congruent affect, judgement and insight appear normal   Data Reviewed:  Labs reviewed and notable for Na 127>>131, glucose 197, BUN 28, Cr 1.96 stable, Troponin 25>>19,  CRP 8.8, Lactic acid 2.8>>1.3, WBC 12.5, Hbg 9.0, D-dimer 1.81, fibrinogen 603, INR 1.6,   Family Communication: none at bedside on rounds, will attempt to call  Disposition: Status is: Inpatient  Remains inpatient appropriate because: severity of illness on IV therapies         No charge   Author: Ezekiel Slocumb, DO 07/28/2021 2:47 PM  For on call review  [email or web address].

## 2021-07-28 NOTE — Progress Notes (Signed)
Remdesivir - Pharmacy Brief Note   O:  CXR: "No evidence of acute cardiopulmonary disease." SpO2: 98-100% on RA   A/P:  Remdesivir 200 mg IVPB once followed by 100 mg IVPB daily x 4 days.   Otelia Sergeant, PharmD, [facility] 07/28/2021 5:20 AM

## 2021-07-28 NOTE — TOC Initial Note (Signed)
Transition of Care [facility]) - Initial/Assessment Note    Patient Details  Name: [name] MRN: [id number] Date of Birth: 1956/02/19  [facility]) CM/SW Contact:    [name] Oms, RN Phone Number: 07/28/2021, 2:37 PM  Clinical Narrative:        Patient is from home, he was treated with IV ABX about a month ago He is now Covid Pos.           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  Has been seen by ID for this before and it was noted that he did not need to be treated with antibiotics for this chronic condition  He lives at home with his wife [name] who is his caregiver  she provides transportation, They have a hospital bed , Wheelchair, Rolling walker, he has a foley and Ostomy Bag, a Hoyer lift was set up at the last admission a month ago, delivered to the home Jan 3rd per wife's request due to them going out of town for [employer] they are already set up with [facility] for [facility] and will need resumption of care orders top continue   TOC to monitor for needs       Patient Goals and CMS Choice        Expected Discharge Plan and Services                                                Prior Living Arrangements/Services                       Activities of Daily Living      Permission Sought/Granted                  Emotional Assessment              Admission diagnosis:  Sepsis due to cellulitis ([place]) [L03.90, A41.9] Cellulitis, unspecified cellulitis site [L03.90] Patient Active Problem List   Diagnosis Date Noted   Sepsis secondary to UTI ([place]) 07/28/2021   COVID-19 virus infection 07/28/2021   Chronic indwelling Foley catheter 06/22/2021   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   TBI (traumatic brain injury)    Ulcer of right heel and midfoot with fat layer exposed ([place]) 04/14/2021   UTI (urinary tract infection) 02/23/2021   Osteomyelitis of vertebra, sacral and  sacrococcygeal region ([place]) 10/18/2020   Hyponatremia 07/07/2020   Encephalopathy 07/07/2020   Chronic multifocal osteomyelitis of right foot ([place]) 07/07/2020   Anemia 07/07/2020   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) 03/19/2020   Severe sepsis with septic shock ([place]) 03/18/2020   Obesity, Class III, BMI 40-49.9 (morbid obesity) ([place]) 03/18/2020   Cellulitis 03/18/2020   Acute cystitis without hematuria    Chronic kidney disease (CKD), stage III (moderate) ([place]) 01/20/2020   Pressure injury of skin 01/17/2020   Sepsis ([place]) 01/16/2020   Cellulitis of sacral region 01/16/2020   Acute kidney injury superimposed on chronic kidney disease ([place]) 01/16/2020   Bipolar disorder, in full remission, most recent episode mixed ([place]) 12/29/2019   High risk medication use 10/25/2019  Noncompliance with treatment plan 10/25/2019   Bipolar I disorder, most recent episode mixed ([place]) 01/07/2019   GAD (generalized anxiety disorder) 01/07/2019   Insomnia due to medical condition 01/07/2019   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 ([place]) 03/06/2016   Bipolar disorder ([place]) 01/03/2016   Lithium toxicity 01/02/2016   Chronic anticoagulation 07/06/2014   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 [place] [address] Phone: [phone] Fax: [phone]     Social Determinants of Health (SDOH) Interventions    Readmission Risk Interventions Readmission Risk Prevention Plan 04/15/2021  Transportation Screening Complete  PCP or Specialist Appt within 3-5 Days Complete  HRI or [place] Complete  Social Work Consult for [place] Planning/Counseling Complete  Palliative Care Screening Not Applicable  Medication Review Press photographer) Complete  Some recent data might be hidden

## 2021-07-28 NOTE — Hospital Course (Addendum)
Same day as admission rounding note.  Please see full H&P by Dr. Arville Care. I agree with the assessment and plan as outlined in H&P.  -- Check repeat lactic acid

## 2021-07-29 DIAGNOSIS — N183 Chronic kidney disease, stage 3 unspecified: Secondary | ICD-10-CM

## 2021-07-29 DIAGNOSIS — Z7901 Long term (current) use of anticoagulants: Secondary | ICD-10-CM

## 2021-07-29 DIAGNOSIS — U071 COVID-19: Secondary | ICD-10-CM

## 2021-07-29 DIAGNOSIS — Z86718 Personal history of other venous thrombosis and embolism: Secondary | ICD-10-CM

## 2021-07-29 DIAGNOSIS — E871 Hypo-osmolality and hyponatremia: Secondary | ICD-10-CM

## 2021-07-29 DIAGNOSIS — F3170 Bipolar disorder, currently in remission, most recent episode unspecified: Secondary | ICD-10-CM

## 2021-07-29 DIAGNOSIS — E785 Hyperlipidemia, unspecified: Secondary | ICD-10-CM

## 2021-07-29 DIAGNOSIS — Z978 Presence of other specified devices: Secondary | ICD-10-CM

## 2021-07-29 LAB — COMPREHENSIVE METABOLIC PANEL
ALT: 11 U/L (ref 0–44)
AST: 14 U/L — ABNORMAL LOW (ref 15–41)
Albumin: 2.5 g/dL — ABNORMAL LOW (ref 3.5–5.0)
Alkaline Phosphatase: 77 U/L (ref 38–126)
Anion gap: 4 — ABNORMAL LOW (ref 5–15)
BUN: 24 mg/dL — ABNORMAL HIGH (ref 8–23)
CO2: 20 mmol/L — ABNORMAL LOW (ref 22–32)
Calcium: 8 mg/dL — ABNORMAL LOW (ref 8.9–10.3)
Chloride: 107 mmol/L (ref 98–111)
Creatinine, Ser: 1.66 mg/dL — ABNORMAL HIGH (ref 0.61–1.24)
GFR, Estimated: 45 mL/min — ABNORMAL LOW (ref >60)
Glucose, Bld: 118 mg/dL — ABNORMAL HIGH (ref 70–99)
Potassium: 4.1 mmol/L (ref 3.5–5.1)
Sodium: 131 mmol/L — ABNORMAL LOW (ref 135–145)
Total Bilirubin: 0.4 mg/dL (ref 0.3–1.2)
Total Protein: 7.2 g/dL (ref 6.5–8.1)

## 2021-07-29 LAB — CBC WITH DIFFERENTIAL/PLATELET
Abs Immature Granulocytes: 0.03 10*3/uL (ref 0.00–0.07)
Basophils Absolute: 0 10*3/uL (ref 0.0–0.1)
Basophils Relative: 1 %
Eosinophils Absolute: 0.6 10*3/uL — ABNORMAL HIGH (ref 0.0–0.5)
Eosinophils Relative: 8 %
HCT: 26.7 % — ABNORMAL LOW (ref 39.0–52.0)
Hemoglobin: 8.3 g/dL — ABNORMAL LOW (ref 13.0–17.0)
Immature Granulocytes: 0 %
Lymphocytes Relative: 12 %
Lymphs Abs: 1 10*3/uL (ref 0.7–4.0)
MCH: 26.8 pg (ref 26.0–34.0)
MCHC: 31.1 g/dL (ref 30.0–36.0)
MCV: 86.1 fL (ref 80.0–100.0)
Monocytes Absolute: 0.8 10*3/uL (ref 0.1–1.0)
Monocytes Relative: 10 %
Neutro Abs: 5.7 10*3/uL (ref 1.7–7.7)
Neutrophils Relative %: 69 %
Platelets: 194 10*3/uL (ref 150–400)
RBC: 3.1 MIL/uL — ABNORMAL LOW (ref 4.22–5.81)
RDW: 17.9 % — ABNORMAL HIGH (ref 11.5–15.5)
WBC: 8.2 10*3/uL (ref 4.0–10.5)
nRBC: 0 % (ref 0.0–0.2)

## 2021-07-29 LAB — MAGNESIUM: Magnesium: 1.8 mg/dL (ref 1.7–2.4)

## 2021-07-29 LAB — GLUCOSE, CAPILLARY
Glucose-Capillary: 113 mg/dL — ABNORMAL HIGH (ref 70–99)
Glucose-Capillary: 125 mg/dL — ABNORMAL HIGH (ref 70–99)
Glucose-Capillary: 122 mg/dL — ABNORMAL HIGH (ref 70–99)
Glucose-Capillary: 141 mg/dL — ABNORMAL HIGH (ref 70–99)

## 2021-07-29 LAB — FERRITIN: Ferritin: 58 ng/mL (ref 24–336)

## 2021-07-29 LAB — TSH: TSH: 2.462 u[IU]/mL (ref 0.350–4.500)

## 2021-07-29 LAB — D-DIMER, QUANTITATIVE: D-Dimer, Quant: 1.52 ug/mL-FEU — ABNORMAL HIGH (ref 0.00–0.50)

## 2021-07-29 LAB — C-REACTIVE PROTEIN: CRP: 11.6 mg/dL — ABNORMAL HIGH (ref <1.0)

## 2021-07-29 MED ORDER — INSULIN ASPART 100 UNIT/ML IJ SOLN
0.0000 [IU] | Freq: Three times a day (TID) | INTRAMUSCULAR | Status: DC
  Administered 2021-07-29 – 2021-07-30 (×3): 2 [IU] via SUBCUTANEOUS
  Administered 2021-07-30: 3 [IU] via SUBCUTANEOUS
  Administered 2021-07-31: 2 [IU] via SUBCUTANEOUS
  Administered 2021-07-31 (×2): 3 [IU] via SUBCUTANEOUS
  Filled 2021-07-29 (×7): qty 1

## 2021-07-29 MED ORDER — INSULIN ASPART 100 UNIT/ML IJ SOLN: 0.0000 [IU] | Freq: Every day | INTRAMUSCULAR | Status: DC

## 2021-07-29 MED ORDER — DAKINS (1/4 STRENGTH) 0.125 % EX SOLN: Freq: Two times a day (BID) | CUTANEOUS | Status: DC

## 2021-07-29 MED ORDER — DAKINS (1/4 STRENGTH) 0.125 % EX SOLN
Freq: Two times a day (BID) | CUTANEOUS | Status: DC
  Filled 2021-07-29: qty 473
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
## 2021-07-29 NOTE — Assessment & Plan Note (Signed)
Stable

## 2021-07-29 NOTE — Assessment & Plan Note (Signed)
-   Continue Eliquis 

## 2021-07-29 NOTE — Assessment & Plan Note (Signed)
Continue statin. 

## 2021-07-29 NOTE — Assessment & Plan Note (Signed)
Currently on remdesivir.  On room air.  Follow inflammatory markers.

## 2021-07-29 NOTE — Assessment & Plan Note (Signed)
Continue IV cefepime, vancomycin and Flagyl.

## 2021-07-29 NOTE — Assessment & Plan Note (Signed)
Add sliding scale.

## 2021-07-29 NOTE — Consult Note (Addendum)
[facility] Nurse Consult Note: Patient receiving care in [facility]. Consult completed remotely. Patient is Covid +. 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 Has been followed by [facility]. Last visit, 07/24/21.   Pressure Injury POA: Yes Measurement: To be provided by the bedside RN in the flowsheet section  Wound bed: see photo Drainage (amount, consistency, odor) To be provided by the bedside RN in the flowsheet section  Periwound:intact, scarred I have ordered twice daily use of Dakin's solution for one week.  Dressing procedure/placement/frequency: Moisten kerlix with Dakin's solution, place into sacral wound, cover with ABD pads. Tape in place.   [facility] nurse will not follow at this time.  Please re-consult the [place] team if needed.  Val Riles, RN, MSN, CWOCN, CNS-BC, pager [phone]

## 2021-07-29 NOTE — Assessment & Plan Note (Signed)
Chronic.  Continue Trileptal and Seroquel.

## 2021-07-29 NOTE — Assessment & Plan Note (Signed)
Chronic.  Patient on multiple psychiatric meds.  We will check TSH.

## 2021-07-29 NOTE — Assessment & Plan Note (Signed)
Chronic.  Present on admission.
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
## 2021-07-29 NOTE — Assessment & Plan Note (Signed)
Chronic.  Present on admission.  Being treated for cellulitis of the sacrum.  On IV Flagyl, cefepime, vancomycin.

## 2021-07-29 NOTE — Subjective & Objective (Signed)
[name] is a 66 y.o. African-American male with medical history significant for bipolar disorder, type 2 diabetes mellitus, hypertension and traumatic brain injury, as well as stage IV sacral decubitus ulcer and chronic osteomyelitis, who presented to the ER with acute onset of erythematous eruption similar to when he had cellulitis and treated with IV antibiotics about a month ago.  No significantly worsening pain.  He denied any fever or chills.  No nausea or vomiting or abdominal pain.  No chest pain or palpitations.  No dysuria, oliguria or hematuria or flank pain.   ED Course: When the patient came to the ER respiratory rate was 21 and later 24 and blood pressure 149/80.  CBC showed leukocytosis at 12.3 with neutrophilia as well as anemia.  Lactic acid was 2.8.  UA was positive for UTI.  Blood culture was sent.  COVID-19 PCR came back positive.  Influenza antigens came back negative.   Imaging: Chest x-ray showed no acute cardiopulmonary disease.  The patient was given IV Rocephin and vancomycin as well as 1 L bolus of IV normal saline.

## 2021-07-29 NOTE — Assessment & Plan Note (Addendum)
Stable.  Baseline creatinine 1.8-1.9

## 2021-07-29 NOTE — Assessment & Plan Note (Signed)
Chronic. 

## 2021-07-29 NOTE — Assessment & Plan Note (Signed)
Admitted for acute cystitis with sepsis.  Lactic acidosis has resolved.  Awaiting urine culture.  Continue IV antibiotics with cefepime, Flagyl and vancomycin.Suzie Portela and Flagyl mostly for his sacral decubitus ulcer.

## 2021-07-30 LAB — CBC WITH DIFFERENTIAL/PLATELET
Abs Immature Granulocytes: 0.01 10*3/uL (ref 0.00–0.07)
Basophils Absolute: 0 10*3/uL (ref 0.0–0.1)
Basophils Relative: 0 %
Eosinophils Absolute: 0.7 10*3/uL — ABNORMAL HIGH (ref 0.0–0.5)
Eosinophils Relative: 12 %
HCT: 26.1 % — ABNORMAL LOW (ref 39.0–52.0)
Hemoglobin: 8.2 g/dL — ABNORMAL LOW (ref 13.0–17.0)
Immature Granulocytes: 0 %
Lymphocytes Relative: 14 %
Lymphs Abs: 0.8 10*3/uL (ref 0.7–4.0)
MCH: 27 pg (ref 26.0–34.0)
MCHC: 31.4 g/dL (ref 30.0–36.0)
MCV: 85.9 fL (ref 80.0–100.0)
Monocytes Absolute: 0.8 10*3/uL (ref 0.1–1.0)
Monocytes Relative: 13 %
Neutro Abs: 3.5 10*3/uL (ref 1.7–7.7)
Neutrophils Relative %: 61 %
Platelets: 202 10*3/uL (ref 150–400)
RBC: 3.04 MIL/uL — ABNORMAL LOW (ref 4.22–5.81)
RDW: 17.7 % — ABNORMAL HIGH (ref 11.5–15.5)
WBC: 5.8 10*3/uL (ref 4.0–10.5)
nRBC: 0 % (ref 0.0–0.2)

## 2021-07-30 LAB — FERRITIN: Ferritin: 51 ng/mL (ref 24–336)

## 2021-07-30 LAB — CREATININE, SERUM
Creatinine, Ser: 1.73 mg/dL — ABNORMAL HIGH (ref 0.61–1.24)
GFR, Estimated: 43 mL/min — ABNORMAL LOW (ref >60)

## 2021-07-30 LAB — C-REACTIVE PROTEIN: CRP: 8 mg/dL — ABNORMAL HIGH (ref <1.0)

## 2021-07-30 LAB — GLUCOSE, CAPILLARY
Glucose-Capillary: 127 mg/dL — ABNORMAL HIGH (ref 70–99)
Glucose-Capillary: 157 mg/dL — ABNORMAL HIGH (ref 70–99)
Glucose-Capillary: 107 mg/dL — ABNORMAL HIGH (ref 70–99)

## 2021-07-30 LAB — D-DIMER, QUANTITATIVE: D-Dimer, Quant: 1.83 ug/mL-FEU — ABNORMAL HIGH (ref 0.00–0.50)

## 2021-07-30 MED ORDER — CEPHALEXIN 500 MG PO CAPS
500.0000 mg | ORAL_CAPSULE | Freq: Four times a day (QID) | ORAL | Status: DC
  Administered 2021-07-30 – 2021-07-31 (×4): 500 mg via ORAL
  Filled 2021-07-30 (×5): qty 1

## 2021-07-30 MED ORDER — CHLORHEXIDINE GLUCONATE CLOTH 2 % EX PADS
6.0000 | MEDICATED_PAD | Freq: Every day | CUTANEOUS | Status: DC
  Administered 2021-07-30 – 2021-07-31 (×2): 6 via TOPICAL

## 2021-07-30 NOTE — Plan of Care (Signed)
  Problem: Education: Goal: Knowledge of General Education information will improve Description: Including pain rating scale, medication(s)/side effects and non-pharmacologic comfort measures Outcome: Not Progressing   Problem: Health Behavior/Discharge Planning: Goal: Ability to manage health-related needs will improve Outcome: Not Progressing   

## 2021-07-30 NOTE — Progress Notes (Signed)
[facility] [place] NAME: [name]    MR#:  [id number]  DATE OF BIRTH:  1956/01/26  SUBJECTIVE:   patient denies any complaints. No fever or shortness of breath. Eating well. Has chronic Foley catheter. No family at bedside. REVIEW OF SYSTEMS:   Review of Systems  Constitutional:  Negative for chills, fever and weight loss.  HENT:  Negative for ear discharge, ear pain and nosebleeds.   Eyes:  Negative for blurred vision, pain and discharge.  Respiratory:  Negative for sputum production, shortness of breath, wheezing and stridor.   Cardiovascular:  Negative for chest pain, palpitations, orthopnea and PND.  Gastrointestinal:  Negative for abdominal pain, diarrhea, nausea and vomiting.  Genitourinary:  Negative for frequency and urgency.  Musculoskeletal:  Negative for back pain and joint pain.  Neurological:  Positive for weakness. Negative for sensory change, speech change and focal weakness.  Psychiatric/Behavioral:  Negative for depression and hallucinations. The patient is not nervous/anxious.   Tolerating Diet:yes Tolerating PT: bedbound  DRUG ALLERGIES:  No Known Allergies  VITALS:  Blood pressure 137/80, pulse 69, temperature 98.6 F (37 C), resp. rate 16, height 5\' 6"  (1.676 m), weight 98.9 kg, SpO2 99 %.  PHYSICAL EXAMINATION:   Physical Exam  GENERAL:  66 y.o.-year-old patient lying in the bed with no acute distress.  LUNGS: Normal breath sounds bilaterally, no wheezing, rales, rhonchi.  CARDIOVASCULAR: S1, S2 normal. No murmurs, rubs, or gallops.  ABDOMEN: Soft, nontender, nondistended. Bowel sounds present. Chronic foley+ EXTREMITIES: No  edema b/l.    NEUROLOGIC: nonfocal PSYCHIATRIC:  patient is alert and awake SKIN:  Pressure Injury 01/16/20 Sacrum Medial;Posterior Stage 4 - Full thickness tissue loss with exposed bone, tendon or muscle. Large open with granulated tissue present. (Active)  01/16/20  2030  Location: Sacrum  Location Orientation: Medial;Posterior  Staging: Stage 4 - Full thickness tissue loss with exposed bone, tendon or muscle.  Wound Description (Comments): Large open with granulated tissue present.  Present on Admission: Yes     Pressure Injury 01/16/20 Heel Right;Posterior Stage 3 -  Full thickness tissue loss. Subcutaneous fat may be visible but bone, tendon or muscle are NOT exposed. (Active)  01/16/20 2030  Location: Heel  Location Orientation: Right;Posterior  Staging: Stage 3 -  Full thickness tissue loss. Subcutaneous fat may be visible but bone, tendon or muscle are NOT exposed.  Wound Description (Comments):   Present on Admission: Yes        LABORATORY PANEL:  CBC Recent Labs  Lab 07/30/21 0419  WBC 5.8  HGB 8.2*  HCT 26.1*  PLT 202    Chemistries  Recent Labs  Lab 07/29/21 0540 07/30/21 0419  NA 131*  --   K 4.1  --   CL 107  --   CO2 20*  --   GLUCOSE 118*  --   BUN 24*  --   CREATININE 1.66* 1.73*  CALCIUM 8.0*  --   MG 1.8  --   AST 14*  --   ALT 11  --   ALKPHOS 77  --   BILITOT 0.4  --    Cardiac Enzymes No results for input(s): TROPONINI in the last 168 hours. RADIOLOGY:  No results found. ASSESSMENT AND PLAN:  [name] is a 66 y.o. African-American male with medical history significant for bipolar disorder, type 2 diabetes mellitus, hypertension and traumatic brain injury, as well as stage IV sacral decubitus ulcer and chronic osteomyelitis,  who presented to the ER with acute onset of erythematous eruption similar to when he had cellulitis and treated with IV antibiotics about a month ago.  No significantly worsening pain.  Sepsis secondary to UTI [facility]) suspect due to indwelling chronic Foley catheter Admitted for acute cystitis with sepsis.  Lactic acidosis has resolved.   -- Urine culture Proteus mirabilis change to PO Keflex -- Foley catheter was changed last week according to wife  chronic sacral  decubitus with chronic osteomyelitis as noted on imaging studies -- patient will have colonization over time. -- Patient follows with wound clinic. -- Per Dr. stones last clinic evaluation in January overall sacral decubitus is stable. -- Dressing changes by wife at home  incidental COVID infection -- no fever, no shortness of breath, no cough -- discontinue Remdesivir--received 3 doses. Patient asymptomatic  chronic kidney disease stage III B -- creatinine stable  history of DVT continue eliquis  patient has multiple other medical comorbidities which are relatively stable  overall he is clinically stable. Discussed with wife she can come pick him up tomorrow     Family communication :wife thelma on the phone Consults : CODE STATUS: full DVT Prophylaxis : eliquis Level of care: Telemetry Medical Status is: Inpatient  patient will discharge tomorrow.        TOTAL TIME TAKING CARE OF THIS PATIENT: 25 minutes.  >50% time spent on counselling and coordination of care  Note: This dictation was prepared with Dragon dictation along with smaller phrase technology. Any transcriptional errors that result from this process are unintentional.  Fritzi Mandes M.D    [facility]   CC: Primary care physician; [name], MD Patient ID: [name], male   DOB: [birth year], 66 y.o.   MRN: [id number]

## 2021-07-31 LAB — GLUCOSE, CAPILLARY
Glucose-Capillary: 115 mg/dL — ABNORMAL HIGH (ref 70–99)
Glucose-Capillary: 125 mg/dL — ABNORMAL HIGH (ref 70–99)
Glucose-Capillary: 160 mg/dL — ABNORMAL HIGH (ref 70–99)
Glucose-Capillary: 170 mg/dL — ABNORMAL HIGH (ref 70–99)

## 2021-07-31 LAB — URINE CULTURE: Culture: >=100000 — AB

## 2021-07-31 MED ORDER — CEPHALEXIN 500 MG PO CAPS: 500.0000 mg | ORAL_CAPSULE | Freq: Four times a day (QID) | ORAL | 0 refills | Status: AC

## 2021-07-31 MED ORDER — ZINC SULFATE 220 (50 ZN) MG PO CAPS: 220.0000 mg | ORAL_CAPSULE | Freq: Every day | ORAL | 0 refills | Status: DC

## 2021-07-31 MED ORDER — VITAMIN D3 25 MCG PO TABS: 1000.0000 [IU] | ORAL_TABLET | Freq: Every day | ORAL | 0 refills | Status: DC

## 2021-07-31 MED ORDER — ASCORBIC ACID 500 MG PO TABS: 500.0000 mg | ORAL_TABLET | Freq: Every day | ORAL | 0 refills | Status: DC

## 2021-07-31 NOTE — Plan of Care (Signed)
Patient discharged. AVS given to patient's wife.

## 2021-07-31 NOTE — Progress Notes (Signed)
Pharmacy notified of patient requesting to move AM oral antibiotic to be administered with meal. Oncoming nurse aware. Awaiting response from pharmacy.

## 2021-07-31 NOTE — Discharge Summary (Signed)
[place] [facility] [place] NAME: Dannell Letsch    MR#:  [id number]  DATE OF BIRTH:  06/07/1956  DATE OF ADMISSION:  07/27/2021 ADMITTING PHYSICIAN: Christel Mormon, MD  DATE OF DISCHARGE: 07/31/2021  PRIMARY CARE PHYSICIAN: [name], MD    ADMISSION DIAGNOSIS:  Sepsis due to cellulitis ([place]) [L03.90, A41.9] Cellulitis, unspecified cellulitis site [L03.90]  DISCHARGE DIAGNOSIS:  Sepsis due to UTI with chronic foley catheter Chronic Decubitus SECONDARY DIAGNOSIS:   Past Medical History:  Diagnosis Date   Bipolar disorder ([place])    Diabetes mellitus without complication ([place])    History of blood clots    Hypertension    TBI (traumatic brain injury)     HOSPITAL COURSE:  Oshay Bjork is a 66 y.o. African-American male with medical history significant for bipolar disorder, type 2 diabetes mellitus, hypertension and traumatic brain injury, as well as stage IV sacral decubitus ulcer and chronic osteomyelitis, who presented to the ER with acute onset of erythematous eruption similar to when he had cellulitis and treated with IV antibiotics about a month ago.  No significantly worsening pain.   Sepsis secondary to UTI [facility]) suspect due to indwelling chronic Foley catheter Admitted for acute cystitis with sepsis.  Lactic acidosis /sepsis has resolved.   -- Urine culture Proteus mirabilis change to PO Keflex -- Foley catheter was changed last week according to wife   chronic sacral decubitus with chronic osteomyelitis as noted on imaging studies -- patient will have colonization over time. -- Patient follows with wound clinic. -- Per Dr. Joaquim Lai  last clinic evaluation in January 07/24/21 overall sacral decubitus is stable. -- Dressing changes by wife at home   incidental COVID infection -- no fever, no shortness of breath, no cough -- discontinue Remdesivir--received 3 doses. Patient asymptomatic   chronic kidney disease  stage III B -- creatinine stable   history of DVT continue eliquis   patient has multiple other medical comorbidities which are relatively stable   overall he is clinically stable. Wife will pick up pt today  CONSULTS OBTAINED:    DRUG ALLERGIES:  No Known Allergies  DISCHARGE MEDICATIONS:   Allergies as of 07/31/2021   No Known Allergies      Medication List     TAKE these medications    ascorbic acid 500 MG tablet Commonly known as: VITAMIN C Take 1 tablet (500 mg total) by mouth daily.   cephALEXin 500 MG capsule Commonly known as: KEFLEX Take 1 capsule (500 mg total) by mouth every 6 (six) hours for 3 days.   Eliquis 5 MG Tabs tablet Generic drug: apixaban Take 5 mg by mouth 2 (two) times daily.   glipiZIDE 2.5 MG 24 hr tablet Commonly known as: GLUCOTROL XL Take 1 tablet (2.5 mg total) by mouth daily with breakfast.   memantine 10 MG tablet Commonly known as: NAMENDA Take 10 mg by mouth 2 (two) times daily.   Omega-3 1000 MG Caps Take by mouth.   Oxcarbazepine 300 MG tablet Commonly known as: TRILEPTAL Take 1 tablet (300 mg total) by mouth 2 (two) times daily.   QUEtiapine 50 MG tablet Commonly known as: SEROQUEL Take 1 tablet (50 mg total) by mouth daily as needed (agitation).   QUEtiapine 100 MG tablet Commonly known as: SEROQUEL TAKE 1 TABLET BY MOUTH AT BEDTIME   rosuvastatin 20 MG tablet Commonly known as: CRESTOR Take 20 mg by mouth daily.   vitamin B-12 1000 MCG tablet Commonly  known as: CYANOCOBALAMIN Take 3,000 mcg by mouth daily.   Vitamin D3 25 MCG tablet Commonly known as: Vitamin D Take 1 tablet (1,000 Units total) by mouth daily.   zinc sulfate 220 (50 Zn) MG capsule Take 1 capsule (220 mg total) by mouth daily.               Discharge Care Instructions  (From admission, onward)           Start     Ordered   07/31/21 0000  Discharge wound care:       Comments: Dressing  procedure/placement/frequency: Moisten kerlix with Dakin's solution, place into sacral wound, cover with ABD pads. Tape in place.   07/31/21 0850            If you experience worsening of your admission symptoms, develop shortness of breath, life threatening emergency, suicidal or homicidal thoughts you must seek medical attention immediately by calling 911 or calling your MD immediately  if symptoms less severe.  You Must read complete instructions/literature along with all the possible adverse reactions/side effects for all the Medicines you take and that have been prescribed to you. Take any new Medicines after you have completely understood and accept all the possible adverse reactions/side effects.   Please note  You were cared for by a hospitalist during your hospital stay. If you have any questions about your discharge medications or the care you received while you were in the hospital after you are discharged, you can call the unit and asked to speak with the hospitalist on call if the hospitalist that took care of you is not available. Once you are discharged, your primary care physician will handle any further medical issues. Please note that NO REFILLS for any discharge medications will be authorized once you are discharged, as it is imperative that you return to your primary care physician (or establish a relationship with a primary care physician if you do not have one) for your aftercare needs so that they can reassess your need for medications and monitor your lab values. Today   SUBJECTIVE   No new complaints  VITAL SIGNS:  Blood pressure 137/74, pulse 65, temperature 97.7 F (36.5 C), resp. rate 16, height 5\' 6"  (1.676 m), weight 98.9 kg, SpO2 100 %.  I/O:   Intake/Output Summary (Last 24 hours) at 07/31/2021 0851 Last data filed at 07/31/2021 0306 Gross per 24 hour  Intake 480 ml  Output 2250 ml  Net -1770 ml    PHYSICAL EXAMINATION:  GENERAL:  66 y.o.-year-old  patient lying in the bed with no acute distress. Obese LUNGS: Normal breath sounds bilaterally CARDIOVASCULAR: S1, S2 normal.  ABDOMEN: Soft, nontender, nondistended. Bowel sounds present. Chronic foley+  NEUROLOGIC: nonfocal PSYCHIATRIC:  patient is alert and awake SKIN:  Pressure Injury 01/16/20 Sacrum Medial;Posterior Stage 4 - Full thickness tissue loss with exposed bone, tendon or muscle. Large open with granulated tissue present. (Active)  01/16/20 2030  Location: Sacrum  Location Orientation: Medial;Posterior  Staging: Stage 4 - Full thickness tissue loss with exposed bone, tendon or muscle.  Wound Description (Comments): Large open with granulated tissue present.  Present on Admission: Yes     Pressure Injury 01/16/20 Heel Right;Posterior Stage 3 -  Full thickness tissue loss. Subcutaneous fat may be visible but bone, tendon or muscle are NOT exposed. (Active)  01/16/20 2030  Location: Heel  Location Orientation: Right;Posterior  Staging: Stage 3 -  Full thickness tissue loss. Subcutaneous fat may be visible  but bone, tendon or muscle are NOT exposed.  Wound Description (Comments):   Present on Admission: Yes    DATA REVIEW:   CBC  Recent Labs  Lab 07/30/21 0419  WBC 5.8  HGB 8.2*  HCT 26.1*  PLT 202    Chemistries  Recent Labs  Lab 07/29/21 0540 07/30/21 0419  NA 131*  --   K 4.1  --   CL 107  --   CO2 20*  --   GLUCOSE 118*  --   BUN 24*  --   CREATININE 1.66* 1.73*  CALCIUM 8.0*  --   MG 1.8  --   AST 14*  --   ALT 11  --   ALKPHOS 77  --   BILITOT 0.4  --     Microbiology Results   Recent Results (from the past 240 hour(s))  Culture, blood (routine x 2)     Status: None (Preliminary result)   Collection Time: 07/27/21  9:52 PM   Specimen: BLOOD  Result Value Ref Range Status   Specimen Description BLOOD RIGHT ANTECUBITAL  Final   Special Requests   Final    BOTTLES DRAWN AEROBIC AND ANAEROBIC Blood Culture adequate volume   Culture   Final     NO GROWTH 4 DAYS Performed at [facility], [address] [place], [place] [address]    Report Status PENDING  Incomplete  Resp Panel by RT-PCR (Flu A&B, Covid) Nasopharyngeal Swab     Status: Abnormal   Collection Time: 07/28/21 12:14 AM   Specimen: Nasopharyngeal Swab; Nasopharyngeal(NP) swabs in vial transport medium  Result Value Ref Range Status   SARS Coronavirus 2 by RT PCR POSITIVE (A) NEGATIVE Final    Comment: (NOTE) SARS-CoV-2 target nucleic acids are DETECTED.  The SARS-CoV-2 RNA is generally detectable in upper respiratory specimens during the acute phase of infection. Positive results are indicative of the presence of the identified virus, but do not rule out bacterial infection or co-infection with other pathogens not detected by the test. Clinical correlation with patient history and other diagnostic information is necessary to determine patient infection status. The expected result is Negative.  Fact Sheet for Patients: EntrepreneurPulse.com.au  Fact Sheet for Healthcare Providers: IncredibleEmployment.be  This test is not yet approved or cleared by the [place] FDA and  has been authorized for detection and/or diagnosis of SARS-CoV-2 by FDA under an Emergency Use Authorization (EUA).  This EUA will remain in effect (meaning this test can be used) for the duration of  the COVID-19 declaration under Section 564(b)(1) of the A ct, 21 U.S.C. section 360bbb-3(b)(1), unless the authorization is terminated or revoked sooner.     Influenza A by PCR NEGATIVE NEGATIVE Final   Influenza B by PCR NEGATIVE NEGATIVE Final    Comment: (NOTE) The Xpert Xpress SARS-CoV-2/FLU/RSV plus assay is intended as an aid in the diagnosis of influenza from Nasopharyngeal swab specimens and should not be used as a sole basis for treatment. Nasal washings and aspirates are unacceptable for Xpert Xpress  SARS-CoV-2/FLU/RSV testing.  Fact Sheet for Patients: EntrepreneurPulse.com.au  Fact Sheet for Healthcare Providers: IncredibleEmployment.be  This test is not yet approved or cleared by the [place] FDA and has been authorized for detection and/or diagnosis of SARS-CoV-2 by FDA under an Emergency Use Authorization (EUA). This EUA will remain in effect (meaning this test can be used) for the duration of the COVID-19 declaration under Section 564(b)(1) of the Act, 21 U.S.C. section 360bbb-3(b)(1), unless  the authorization is terminated or revoked.  Performed at [facility], [address] [place], [place] [address]   Urine Culture     Status: Abnormal   Collection Time: 07/28/21 12:14 AM   Specimen: Urine, Random  Result Value Ref Range Status   Specimen Description   Final    URINE, RANDOM Performed at [facility], [address] [place], [place] [address]    Special Requests   Final    NONE Performed at [facility], [address] [place], [place] [address]    Culture >=100,000 COLONIES/mL PROTEUS MIRABILIS (A)  Final   Report Status 07/31/2021 FINAL  Final   Organism ID, Bacteria PROTEUS MIRABILIS (A)  Final      Susceptibility   Proteus mirabilis - MIC*    AMPICILLIN <=2 SENSITIVE Sensitive     CEFAZOLIN <=4 SENSITIVE Sensitive     CEFEPIME <=0.12 SENSITIVE Sensitive     CEFTRIAXONE <=0.25 SENSITIVE Sensitive     CIPROFLOXACIN <=0.25 SENSITIVE Sensitive     GENTAMICIN <=1 SENSITIVE Sensitive     IMIPENEM 1 SENSITIVE Sensitive     NITROFURANTOIN 128 RESISTANT Resistant     TRIMETH/SULFA <=20 SENSITIVE Sensitive     AMPICILLIN/SULBACTAM <=2 SENSITIVE Sensitive     PIP/TAZO <=4 SENSITIVE Sensitive     * >=100,000 COLONIES/mL PROTEUS MIRABILIS    RADIOLOGY:  No results found.   CODE STATUS:     Code Status Orders  (From admission, onward)           Start     Ordered   07/28/21  0138  Full code  Continuous        07/28/21 0140           Code Status History     Date Active Date Inactive Code Status Order ID Comments User Context   06/22/2021 0403 06/25/2021 2001 Full Code [id number]  Athena Masse, MD ED   02/23/2021 1651 03/03/2021 2300 Full Code [id number]  Wyvonnia Dusky, MD ED   03/18/2020 1553 03/22/2020 2055 Full Code [id number]  Collier Bullock, MD ED   01/16/2020 1840 01/19/2020 1945 Full Code [id number]  Ivor Costa, MD Inpatient   01/02/2016 2050 01/04/2016 2147 Full Code [id number]  Baxter Hire, MD Inpatient        TOTAL TIME TAKING CARE OF THIS PATIENT: 35  minutes.    Fritzi Mandes M.D  [facility]    CC: Primary care physician; [name], MD

## 2021-07-31 NOTE — Discharge Instructions (Signed)
Foley care per instructions 

## 2021-07-31 NOTE — Care Management Important Message (Signed)
Important Message  Patient Details  Name: [name] MRN: [id number] Date of Birth: 14-Mar-1956   Medicare Important Message Given:  Yes  Patient is in a isolation room so I reviewed his Important Message from Medicare with him by phone [phone]).  He is in agreement with this discharge and no copy needed.  I wished him a speedy recovery and thanked him for his time.  Olegario Messier A Jet Armbrust 07/31/2021, 10:42 AM

## 2021-08-01 LAB — CULTURE, BLOOD (ROUTINE X 2)
Culture: NO GROWTH
Special Requests: ADEQUATE

## 2021-08-03 ENCOUNTER — Other Ambulatory Visit: Payer: Self-pay

## 2021-08-03 ENCOUNTER — Ambulatory Visit
Admission: RE | Admit: 2021-08-03 | Discharge: 2021-08-03 | Disposition: A | Discharge status: Home / Self Care | Payer: Medicare Other | Source: Ambulatory Visit | Attending: Urology | Admitting: Urology

## 2021-08-03 DIAGNOSIS — N1330 Unspecified hydronephrosis: Secondary | ICD-10-CM | POA: Insufficient documentation

## 2021-08-07 ENCOUNTER — Other Ambulatory Visit: Payer: Self-pay

## 2021-08-07 ENCOUNTER — Encounter: Payer: Medicare Other | Attending: Physician Assistant | Admitting: Physician Assistant

## 2021-08-07 DIAGNOSIS — L89154 Pressure ulcer of sacral region, stage 4: Secondary | ICD-10-CM | POA: Insufficient documentation

## 2021-08-07 DIAGNOSIS — Z87820 Personal history of traumatic brain injury: Secondary | ICD-10-CM | POA: Diagnosis not present

## 2021-08-07 DIAGNOSIS — E11622 Type 2 diabetes mellitus with other skin ulcer: Secondary | ICD-10-CM | POA: Diagnosis not present

## 2021-08-07 DIAGNOSIS — Z7901 Long term (current) use of anticoagulants: Secondary | ICD-10-CM | POA: Diagnosis not present

## 2021-08-07 DIAGNOSIS — L89892 Pressure ulcer of other site, stage 2: Secondary | ICD-10-CM | POA: Insufficient documentation

## 2021-08-07 DIAGNOSIS — M6281 Muscle weakness (generalized): Secondary | ICD-10-CM | POA: Insufficient documentation

## 2021-08-07 DIAGNOSIS — F319 Bipolar disorder, unspecified: Secondary | ICD-10-CM | POA: Insufficient documentation

## 2021-08-07 DIAGNOSIS — E1140 Type 2 diabetes mellitus with diabetic neuropathy, unspecified: Secondary | ICD-10-CM | POA: Diagnosis not present

## 2021-08-07 DIAGNOSIS — I10 Essential (primary) hypertension: Secondary | ICD-10-CM | POA: Diagnosis not present

## 2021-08-07 DIAGNOSIS — L89613 Pressure ulcer of right heel, stage 3: Secondary | ICD-10-CM | POA: Insufficient documentation

## 2021-08-07 NOTE — Progress Notes (Addendum)
[name] ([id number]) Visit Report for 08/07/2021 Chief Complaint Document Details Patient Name: [name] Date of Service: 08/07/2021 3:30 PM Medical Record Number: [id number] Patient Account Number: [id number] Date of Birth/Sex: Aug 15, 1955 (66 y.o. M) Treating RN: Cornell Barman Primary Care Provider: [name] Other Clinician: Referring Provider: [name] Treating Provider/Extender: Skipper Cliche in Treatment: 10 Information Obtained from: Patient Chief Complaint Sacral, right gluteal, and right heel ulcers Electronic Signature(s) Signed: 08/07/2021 3:46:50 PM By: [name] PA-C Entered By: [name] on 08/07/2021 15:46:49 [name] ([id number]) -------------------------------------------------------------------------------- HPI Details Patient Name: [name] Date of Service: 08/07/2021 3:30 PM Medical Record Number: [id number] Patient Account Number: [id number] Date of Birth/Sex: 1955-12-28 (66 y.o. M) Treating RN: Cornell Barman Primary Care Provider: [name] Other Clinician: Referring Provider: [name] Treating Provider/Extender: Skipper Cliche in Treatment: 10 History of Present Illness HPI Description: 05/29/2021 this is a patient who presents today for initial inspection here in the clinic concerning wounds that he has over the right heel and the sacral region. Unfortunately the sacral wound is starting to spread off to the right gluteal location due to how he sits always leaning towards the right side in his chair. His wife is present she is the primary caregiver though she is not home with him all the time she does have to work. She does do an excellent job however it appears to be in trying to keep things under good control for him. The patient is not able to change positions himself nor walk by himself so he is pretty much at the mercy of the position he is putting when she is gone  and this tends to be his chair which she sits and most of the day. Obviously this I think is the main culprit for what is going on currently. It was actually in January 2020 when the sacral wound started. It was in September 2022 when the wound started to spread more to the right gluteal location. Subsequently in August 2022 is when he had been in a skilled nursing facility and the heel started to give him trouble as well. That does not seem to be doing nearly as poorly as the sacral region. He was hospitalized in October 2022 secondary to sepsis and this was in regard to the foot and was sent to skilled nursing again he is now back at home. He did have a wound VAC for the sacral wound over the summer 2022 but being in and out of facilities this ended up getting sent back. The patient does have [facility] home health that comes out 1 time per week to help with care. His most recent hemoglobin A1c was 6.9 in August 2022. Patient's met past medical history includes generalized muscle weakness, bipolar disorder, diabetes mellitus type 2, hypertension, long-term use of anticoagulant therapy due to frequent blood clots/DVTs. He also has a history of traumatic brain injury. 07/24/2021 upon evaluation today patient appears to be doing decently well in regard to the pressure ulcer on the right heel as well as the sacral region. In general I think you are making some progress here which is great news. Overall the heel unfortunately had already closed previously when we saw him although it apparently reopened when he was working with physical therapy according to his wife. The area in the sacral region is doing well and looks clean there is still some depth here but I still think it would be difficult to wound VAC this region. His wife  does an awesome job taking care of him. Is been so long since we have seen him because he has been in the hospital to be honest. 08/07/2021 upon evaluation today patient appears to be  doing well at this time. Fortunately I do not see any signs of active infection locally or systemically at this time which is great news. No fevers, chills, nausea, vomiting, or diarrhea. Unfortunately after I saw him on the 24th he actually ended up in the hospital in the 27th due to being septic. This was not due to the wounds but after looking at records actually due to a UTI. Fortunately he is doing much better and very happy in that regard. I do not see any signs of infection locally nor systemically at this time. Electronic Signature(s) Signed: 08/07/2021 4:04:27 PM By: [name] PA-C Entered By: [name] on 08/07/2021 16:04:26 [name] ([id number]) -------------------------------------------------------------------------------- Physical Exam Details Patient Name: [name] Date of Service: 08/07/2021 3:30 PM Medical Record Number: [id number] Patient Account Number: [id number] Date of Birth/Sex: [birth year] (66 y.o. M) Treating RN: Cornell Barman Primary Care Provider: [name] Other Clinician: Referring Provider: [name] Treating Provider/Extender: Skipper Cliche in Treatment: 10 Constitutional Obese and well-hydrated in no acute distress. Respiratory normal breathing without difficulty. Psychiatric this patient is able to make decisions and demonstrates good insight into disease process. Alert and Oriented x 3. pleasant and cooperative. Notes Upon inspection patient's wound bed showed signs of good granulation epithelization in regard to sacral wound as well as the heel. Both wounds are improving which is great news and overall I am very pleased with where we stand. Electronic Signature(s) Signed: 08/07/2021 4:04:50 PM By: [name] PA-C Entered By: [name] on 08/07/2021 16:04:50 [name] ([id number]) -------------------------------------------------------------------------------- Physician Orders  Details Patient Name: [name] Date of Service: 08/07/2021 3:30 PM Medical Record Number: [id number] Patient Account Number: [id number] Date of Birth/Sex: 1955-09-24 (66 y.o. M) Treating RN: Donnamarie Poag Primary Care Provider: [name] Other Clinician: Referring Provider: [name] Treating Provider/Extender: Skipper Cliche in Treatment: 10 Verbal / Phone Orders: No Diagnosis Coding ICD-10 Coding Code Description [phone] Pressure ulcer of right heel, stage 3 L89.154 Pressure ulcer of sacral region, stage 4 M62.81 Muscle weakness (generalized) F31.9 Bipolar disorder, unspecified E11.622 Type 2 diabetes mellitus with other skin ulcer I10 Essential (primary) hypertension Z79.01 Long term (current) use of anticoagulants 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 Follow-up Appointments o Return Appointment in 2 weeks. o Nurse Visit as needed [facility] for wound care. May utilize formulary equivalent dressing for wound treatment orders unless otherwise specified. Home Health Nurse may visit [facility] to address patientos wound care needs. - for sacrum and right heel o Scheduled days for dressing changes to be completed; exception, patient has scheduled wound care visit that day. o **Please direct any NON-WOUND related issues/requests for orders to patient's Primary Care Physician. **If current dressing causes regression in wound condition, may D/C ordered dressing product/s and apply Normal Saline Moist Dressing daily until next [facility] or Other MD appointment. **Notify [facility] of regression in wound condition at [phone]. Bathing/ Shower/ Hygiene o May shower with wound dressing protected with water repellent cover or cast protector. o No tub bath. Anesthetic (Use 'Patient Medications' Section for Anesthetic Order Entry) o Lidocaine applied to wound  bed Edema Control - Lymphedema / Segmental Compressive Device / Other o Elevate leg(s)  parallel to the floor when sitting. o DO YOUR BEST to sleep in the bed at night. DO NOT sleep in your recliner. Long hours of sitting in a recliner leads to swelling of the legs and/or potential wounds on your backside. Non-Wound Condition o Additional non-wound orders/instructions: - AandD to excoriated areas of gluteus for protection where adhesive isn't needed to promote healing Off-Loading o Gel wheelchair cushion o Low air-loss mattress (Group 2) o Turn and reposition every 2 hours - keep pressure off of the sacrum and right heel wounds o Other: - PRAFO boot in bed keep pressure off of sacrum/gluteus and right heel- Additional Orders / Instructions o Follow Nutritious Diet and Increase Protein Intake Wound Treatment Wound #1 - Calcaneus Wound Laterality: Right Cleanser: Byram Ancillary Kit - 15 Day Supply 1 x Per Day/15 Days [place]a, [name] ([id number]) Discharge Instructions: Use supplies as instructed; Kit contains: (15) Saline Bullets; (15) 3x3 Gauze; 15 pr Gloves Cleanser: Normal Saline 1 x Per Day/15 Days Discharge Instructions: Wash your hands with soap and water. Remove old dressing, discard into plastic bag and place into trash. Cleanse the wound with Normal Saline prior to applying a clean dressing using gauze sponges, not tissues or cotton balls. Do not scrub or use excessive force. Pat dry using gauze sponges, not tissue or cotton balls. Cleanser: Soap and Water 1 x Per Day/15 Days Discharge Instructions: Gently cleanse wound with antibacterial soap, rinse and pat dry prior to dressing wounds Primary Dressing: Silvercel Small 2x2 (in/in) 1 x Per Day/15 Days Discharge Instructions: Apply Silvercel Small 2x2 (in/in) as instructed Secondary Dressing: Mepilex Border Flex, 4x4 (in/in) 1 x Per Day/15 Days Discharge Instructions: Apply to wound as directed. Do not  cut. Wound #2 - Sacrum Cleanser: Byram Ancillary Kit - 15 Day Supply 1 x Per Day/15 Days Discharge Instructions: Use supplies as instructed; Kit contains: (15) Saline Bullets; (15) 3x3 Gauze; 15 pr Gloves Cleanser: Dakin 16 (oz) 0.25 1 x Per Day/15 Days Discharge Instructions: Use as directed. Cleanser: Normal Saline 1 x Per [facility]/07 Days Discharge Instructions: Wash your hands with soap and water. Remove old dressing, discard into plastic bag and place into trash. Cleanse the wound with Normal Saline prior to applying a clean dressing using gauze sponges, not tissues or cotton balls. Do not scrub or use excessive force. Pat dry using gauze sponges, not tissue or cotton balls. Cleanser: Soap and Water 1 x Per [facility]/06 Days Discharge Instructions: Gently cleanse wound with antibacterial soap, rinse and pat dry prior to dressing wounds Primary Dressing: Gauze 1 x Per Day/15 Days Discharge Instructions: Dakins gauze-As directed: dry, moistened with saline or moistened with Dakins Solution Secondary Dressing: ABD Pad 5x9 (in/in) 1 x Per Day/15 Days Discharge Instructions: Cover with ABD pad Secured With: 42M Medipore H Soft Cloth Surgical Tape, 2x2 (in/yd) 1 x Per Day/15 Days Electronic Signature(s) Signed: 08/07/2021 4:09:23 PM By: [name] PA-C Signed: 08/07/2021 4:15:31 PM By: Donnamarie Poag Entered By: Donnamarie Poag on 08/07/2021 16:08:22 Mcmillion, [name] ([id number]) -------------------------------------------------------------------------------- Problem List Details Patient Name: [name] Date of Service: 08/07/2021 3:30 PM Medical Record Number: [id number] Patient Account Number: [id number] Date of Birth/Sex: December 01, 1955 (65 y.o. M) Treating RN: Cornell Barman Primary Care Provider: [name] Other Clinician: Referring Provider: [name] Treating Provider/Extender: Skipper Cliche in Treatment: 10 Active Problems ICD-10 Encounter Code Description Active  Date MDM Diagnosis L89.613 Pressure ulcer of right heel, stage 3 05/29/2021 No Yes L89.154 Pressure ulcer of sacral region, stage 4 05/29/2021  No Yes M62.81 Muscle weakness (generalized) 05/29/2021 No Yes F31.9 Bipolar disorder, unspecified 05/29/2021 No Yes E11.622 Type 2 diabetes mellitus with other skin ulcer 05/29/2021 No Yes I10 Essential (primary) hypertension 05/29/2021 No Yes Z79.01 Long term (current) use of anticoagulants 05/29/2021 No Yes 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 05/29/2021 No Yes Inactive Problems Resolved Problems Electronic Signature(s) Signed: 08/07/2021 3:46:37 PM By: [name] PA-C Entered By: [name] on 08/07/2021 15:46:36 Revak, [name] ([id number]) -------------------------------------------------------------------------------- Progress Note Details Patient Name: [name] Date of Service: 08/07/2021 3:30 PM Medical Record Number: [id number] Patient Account Number: [id number] Date of Birth/Sex: 1956-06-05 (65 y.o. M) Treating RN: Cornell Barman Primary Care Provider: [name] Other Clinician: Referring Provider: [name] Treating Provider/Extender: Skipper Cliche in Treatment: 10 Subjective Chief Complaint Information obtained from Patient Sacral, right gluteal, and right heel ulcers History of Present Illness (HPI) 05/29/2021 this is a patient who presents today for initial inspection here in the clinic concerning wounds that he has over the right heel and the sacral region. Unfortunately the sacral wound is starting to spread off to the right gluteal location due to how he sits always leaning towards the right side in his chair. His wife is present she is the primary caregiver though she is not home with him all the time she does have to work. She does do an excellent job however it appears to be in trying to keep things under good control for him. The patient is not able to change  positions himself nor walk by himself so he is pretty much at the mercy of the position he is putting when she is gone and this tends to be his chair which she sits and most of the day. Obviously this I think is the main culprit for what is going on currently. It was actually in January 2020 when the sacral wound started. It was in September 2022 when the wound started to spread more to the right gluteal location. Subsequently in August 2022 is when he had been in a skilled nursing facility and the heel started to give him trouble as well. That does not seem to be doing nearly as poorly as the sacral region. He was hospitalized in October 2022 secondary to sepsis and this was in regard to the foot and was sent to skilled nursing again he is now back at home. He did have a wound VAC for the sacral wound over the summer 2022 but being in and out of facilities this ended up getting sent back. The patient does have [facility] home health that comes out 1 time per week to help with care. His most recent hemoglobin A1c was 6.9 in August 2022. Patient's met past medical history includes generalized muscle weakness, bipolar disorder, diabetes mellitus type 2, hypertension, long-term use of anticoagulant therapy due to frequent blood clots/DVTs. He also has a history of traumatic brain injury. 07/24/2021 upon evaluation today patient appears to be doing decently well in regard to the pressure ulcer on the right heel as well as the sacral region. In general I think you are making some progress here which is great news. Overall the heel unfortunately had already closed previously when we saw him although it apparently reopened when he was working with physical therapy according to his wife. The area in the sacral region is doing well and looks clean there is still some depth here but I still think it would be difficult to wound VAC this region. His  wife does an awesome job taking care of him. Is been so long since  we have seen him because he has been in the hospital to be honest. 08/07/2021 upon evaluation today patient appears to be doing well at this time. Fortunately I do not see any signs of active infection locally or systemically at this time which is great news. No fevers, chills, nausea, vomiting, or diarrhea. Unfortunately after I saw him on the 24th he actually ended up in the hospital in the 27th due to being septic. This was not due to the wounds but after looking at records actually due to a UTI. Fortunately he is doing much better and very happy in that regard. I do not see any signs of infection locally nor systemically at this time. Objective Constitutional Obese and well-hydrated in no acute distress. Vitals Time Taken: 3:35 PM, Height: 66 in, Temperature: 97.9 F, Pulse: 86 bpm, Respiratory Rate: 18 breaths/min, Blood Pressure: 170/100 mmHg. Respiratory normal breathing without difficulty. Psychiatric this patient is able to make decisions and demonstrates good insight into disease process. Alert and Oriented x 3. pleasant and cooperative. General Notes: Upon inspection patient's wound bed showed signs of good granulation epithelization in regard to sacral wound as well as the heel. Both wounds are improving which is great news and overall I am very pleased with where we stand. Integumentary (Hair, Skin) Wound #1 status is Open. Original cause of wound was Gradually Appeared. The date acquired was: 02/16/2021. The wound has been in treatment 10 weeks. The wound is located on the Right Calcaneus. The wound measures 1cm length x 2.3cm width x 0.1cm depth; 1.806cm^2 area and Kervin, Merlin ([id number]) 0.181cm^3 volume. There is Fat Layer (Subcutaneous Tissue) exposed. There is a medium amount of serosanguineous drainage noted. There is small (1-33%) red, pink granulation within the wound bed. There is a large (67-100%) amount of necrotic tissue within the wound bed including Eschar  and Adherent Slough. Wound #2 status is Open. Original cause of wound was Gradually Appeared. The date acquired was: 07/21/2018. The wound has been in treatment 10 weeks. The wound is located on the Sacrum. The wound measures 3cm length x 1.2cm width x 2.8cm depth; 2.827cm^2 area and 7.917cm^3 volume. There is muscle and Fat Layer (Subcutaneous Tissue) exposed. There is a large amount of serosanguineous drainage noted. There is large (67-100%) red, pink granulation within the wound bed. There is a small (1-33%) amount of necrotic tissue within the wound bed including Adherent Slough. 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 I Minna suggest we going continue with the wound care measures as before and the patient is in agreement with plan. We are using the Dakin's moistened gauze dressing to the sacral region which I think is doing an excellent job. 2. I am also can recommend that we have the patient continue with the silver alginate dressing to the heel which I think is also doing well. 3. I would also suggest that the patient continue to elevate his legs to prevent pressure to the heel location. The more this he can do the better. We will see patient back for reevaluation in 2 weeks here in the clinic. If anything worsens or changes patient will contact our office for additional recommendations.  Electronic Signature(s) Signed: 08/07/2021 4:05:40 PM By: [name] PA-C Entered By: [name] on 08/07/2021 16:05:40 Dickens, [name] ([id number]) -------------------------------------------------------------------------------- SuperBill Details Patient Name: [name] Date of Service: 08/07/2021 Medical Record Number: [id number] Patient Account Number: [id number] Date of Birth/Sex: [birth year] (65 y.o. M) Treating RN: Cornell Barman Primary Care Provider: [name] Other Clinician: Referring Provider: [name] Treating Provider/Extender: Skipper Cliche in Treatment: 10 Diagnosis Coding ICD-10 Codes Code Description [phone] Pressure ulcer of right heel, stage 3 L89.154 Pressure ulcer of sacral region, stage 4 M62.81 Muscle weakness (generalized) F31.9 Bipolar disorder, unspecified E11.622 Type 2 diabetes mellitus with other skin ulcer I10 Essential (primary) hypertension Z79.01 Long term (current) use of anticoagulants 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 Facility Procedures CPT4 Code: 24497530 Description: 99213 - WOUND CARE VISIT-LEV 3 EST PT Modifier: Quantity: 1 Physician Procedures CPT4 Code: 0511021 Description: 11735 - WC PHYS LEVEL 4 - EST PT Modifier: Quantity: 1 CPT4 Code: Description: ICD-10 Diagnosis Description L89.613 Pressure ulcer of right heel, stage 3 L89.154 Pressure ulcer of sacral region, stage 4 M62.81 Muscle weakness (generalized) F31.9 Bipolar disorder, unspecified Modifier: Quantity: Electronic Signature(s) Signed: 08/07/2021 4:15:31 PM By: Donnamarie Poag Previous Signature: 08/07/2021 4:08:43 PM Version By: [name] PA-C Entered By: Donnamarie Poag on 08/07/2021 16:09:52

## 2021-08-07 NOTE — Progress Notes (Signed)
DIONISIO, ARAGONES ([id number]) Visit Report for 08/07/2021 Arrival Information Details Patient Name: [name], [name] Date of Service: 08/07/2021 3:30 PM Medical Record Number: [id number] Patient Account Number: [id number] Date of Birth/Sex: [birth year] (66 y.o. M) Treating RN: Donnamarie Poag Primary Care Kashlynn Kundert: [name] Other Clinician: Referring Kaytlynne Neace: [name] Treating Alayziah Tangeman/Extender: Skipper Cliche in Treatment: 10 Visit Information History Since Last Visit Added or deleted any medications: No Patient Arrived: Wheel Chair Had a fall or experienced change in No Arrival Time: 15:34 activities of daily living that may affect Accompanied By: wife risk of falls: Transfer Assistance: Manual Hospitalized since last visit: No Patient Identification Verified: Yes Has Dressing in Place as Prescribed: Yes Secondary Verification Process Completed: Yes Pain Present Now: No Patient Requires Transmission-Based No Precautions: Patient Has Alerts: Yes Patient Alerts: Patient on Blood Thinner DIABETIC Eliquis Electronic Signature(s) Signed: 08/07/2021 4:15:31 PM By: Donnamarie Poag Entered By: Donnamarie Poag on 08/07/2021 15:35:02 Obeso, [name] ([id number]) -------------------------------------------------------------------------------- Clinic Level of Care Assessment Details Patient Name: [name] Date of Service: 08/07/2021 3:30 PM Medical Record Number: [id number] Patient Account Number: [id number] Date of Birth/Sex: Feb 11, 1956 (66 y.o. M) Treating RN: Donnamarie Poag Primary Care Remy Dia: [name] Other Clinician: Referring Kenniel Bergsma: [name] Treating Nadiah Corbit/Extender: Skipper Cliche in Treatment: 10 Clinic Level of Care Assessment Items TOOL 4 Quantity Score []  - Use when only an EandM is performed on FOLLOW-UP visit 0 ASSESSMENTS - Nursing Assessment / Reassessment []  - Reassessment of Co-morbidities (includes updates  in patient status) 0 []  - 0 Reassessment of Adherence to Treatment Plan ASSESSMENTS - Wound and Skin Assessment / Reassessment []  - Simple Wound Assessment / Reassessment - one wound 0 X- 2 5 Complex Wound Assessment / Reassessment - multiple wounds []  - 0 Dermatologic / Skin Assessment (not related to wound area) ASSESSMENTS - Focused Assessment []  - Circumferential Edema Measurements - multi extremities 0 []  - 0 Nutritional Assessment / Counseling / Intervention []  - 0 Lower Extremity Assessment (monofilament, tuning fork, pulses) []  - 0 Peripheral Arterial Disease Assessment (using hand held doppler) ASSESSMENTS - Ostomy and/or Continence Assessment and Care []  - Incontinence Assessment and Management 0 []  - 0 Ostomy Care Assessment and Management (repouching, etc.) PROCESS - Coordination of Care X - Simple Patient / Family Education for ongoing care 1 15 []  - 0 Complex (extensive) Patient / Family Education for ongoing care X- 1 10 Staff obtains Programmer, systems, Records, Test Results / Process Orders []  - 0 Staff telephones HHA, Nursing Homes / Clarify orders / etc []  - 0 Routine Transfer to another Facility (non-emergent condition) []  - 0 Routine Hospital Admission (non-emergent condition) []  - 0 New Admissions / Biomedical engineer / Ordering NPWT, Apligraf, etc. []  - 0 Emergency Hospital Admission (emergent condition) X- 1 10 Simple Discharge Coordination []  - 0 Complex (extensive) Discharge Coordination PROCESS - Special Needs []  - Pediatric / Minor Patient Management 0 []  - 0 Isolation Patient Management []  - 0 Hearing / Language / Visual special needs []  - 0 Assessment of Community assistance (transportation, D/C planning, etc.) []  - 0 Additional assistance / Altered mentation []  - 0 Support Surface(s) Assessment (bed, cushion, seat, etc.) INTERVENTIONS - Wound Cleansing / Measurement Fero, Elric ([id number]) []  - 0 Simple Wound Cleansing -  one wound X- 2 5 Complex Wound Cleansing - multiple wounds X- 1 5 Wound Imaging (photographs - any number of wounds) []  - 0 Wound Tracing (instead of photographs) []  - 0 Simple Wound Measurement - one wound X- 2 5 Complex  Wound Measurement - multiple wounds INTERVENTIONS - Wound Dressings X - Small Wound Dressing one or multiple wounds 2 10 []  - 0 Medium Wound Dressing one or multiple wounds []  - 0 Large Wound Dressing one or multiple wounds []  - 0 Application of Medications - topical []  - 0 Application of Medications - injection INTERVENTIONS - Miscellaneous []  - External ear exam 0 []  - 0 Specimen Collection (cultures, biopsies, blood, body fluids, etc.) []  - 0 Specimen(s) / Culture(s) sent or taken to Lab for analysis []  - 0 Patient Transfer (multiple staff / Civil Service fast streamer / Similar devices) []  - 0 Simple Staple / Suture removal (25 or less) []  - 0 Complex Staple / Suture removal (26 or more) []  - 0 Hypo / Hyperglycemic Management (close monitor of Blood Glucose) []  - 0 Ankle / Brachial Index (ABI) - do not check if billed separately X- 1 5 Vital Signs Has the patient been seen at the hospital within the last three years: Yes Total Score: 95 Level Of Care: New/Established - Level 3 Electronic Signature(s) Signed: 08/07/2021 4:15:31 PM By: Donnamarie Poag Entered By: Donnamarie Poag on 08/07/2021 16:09:15 Moch, [name] ([id number]) -------------------------------------------------------------------------------- Encounter Discharge Information Details Patient Name: [name] Date of Service: 08/07/2021 3:30 PM Medical Record Number: [id number] Patient Account Number: [id number] Date of Birth/Sex: 20-Apr-1956 (66 y.o. M) Treating RN: Donnamarie Poag Primary Care Deysy Schabel: [name] Other Clinician: Referring Tarin Johndrow: [name] Treating Baylen Dea/Extender: Skipper Cliche in Treatment: 10 Encounter Discharge Information Items Discharge  Condition: Stable Ambulatory Status: Wheelchair Discharge Destination: Home Transportation: Private Auto Accompanied By: wife Schedule Follow-up Appointment: Yes Clinical Summary of Care: Notes AandO, denies dizziness or h/a; wife stated she will not serve him salt anymore as noted B/P veryhigh and will monitor BP and follow PCP Electronic Signature(s) Signed: 08/07/2021 4:15:31 PM By: Donnamarie Poag Entered By: Donnamarie Poag on 08/07/2021 16:11:32 Lingerfelt, [name] ([id number]) -------------------------------------------------------------------------------- Lower Extremity Assessment Details Patient Name: [name] Date of Service: 08/07/2021 3:30 PM Medical Record Number: [id number] Patient Account Number: [id number] Date of Birth/Sex: 21-Jan-1956 (66 y.o. M) Treating RN: Donnamarie Poag Primary Care Islam Villescas: [name] Other Clinician: Referring Jove Beyl: [name] Treating Christorpher Hisaw/Extender: Jeri Cos Weeks in Treatment: 10 Electronic Signature(s) Signed: 08/07/2021 4:15:31 PM By: Donnamarie Poag Entered By: Donnamarie Poag on 08/07/2021 15:44:40 Arts, [name] ([id number]) -------------------------------------------------------------------------------- Multi Wound Chart Details Patient Name: [name] Date of Service: 08/07/2021 3:30 PM Medical Record Number: [id number] Patient Account Number: [id number] Date of Birth/Sex: 10-07-1955 (66 y.o. M) Treating RN: Donnamarie Poag Primary Care Gleen Ripberger: [name] Other Clinician: Referring Aanchal Cope: [name] Treating Anush Wiedeman/Extender: Skipper Cliche in Treatment: 10 Vital Signs Height(in): 24 Pulse(bpm): 53 Weight(lbs): Blood Pressure(mmHg): 170/100 Body Mass Index(BMI): Temperature(F): 97.9 Respiratory Rate(breaths/min): 18 Photos: [N/A:N/A] Wound Location: Right Calcaneus Sacrum N/A Wounding Event: Gradually Appeared Gradually Appeared N/A Primary Etiology: Pressure Ulcer  Pressure Ulcer N/A Secondary Etiology: Diabetic Wound/Ulcer of the Lower N/A N/A Extremity Comorbid History: Anemia, Sleep Apnea, Coronary Anemia, Sleep Apnea, Coronary N/A Artery Disease, Deep Vein Artery Disease, Deep Vein Thrombosis, Hypertension, Type II Thrombosis, Hypertension, Type II Diabetes, History of pressure Diabetes, History of pressure wounds, Neuropathy wounds, Neuropathy Date Acquired: 02/16/2021 07/21/2018 N/A Weeks of Treatment: 10 10 N/A Wound Status: Open Open N/A Wound Recurrence: No No N/A Measurements L x W x D (cm) 1x2.3x0.1 3x1.2x2.8 N/A Area (cm) : 1.806 2.827 N/A Volume (cm) : 0.181 7.917 N/A % Reduction in Area: -27.70% 91.50% N/A % Reduction in Volume: -28.40% 93.40% N/A Classification: Category/Stage III  Category/Stage IV N/A Exudate Amount: Medium Large N/A Exudate Type: Serosanguineous Serosanguineous N/A Exudate Color: red, brown red, brown N/A Granulation Amount: Small (1-33%) Large (67-100%) N/A Granulation Quality: Red, Pink Red, Pink N/A Necrotic Amount: Large (67-100%) Small (1-33%) N/A Necrotic Tissue: Eschar, Adherent [place] N/A Exposed Structures: Fat Layer (Subcutaneous Tissue): Fat Layer (Subcutaneous Tissue): N/A Yes Yes Fascia: No Muscle: Yes Tendon: No Fascia: No Muscle: No Tendon: No Joint: No Joint: No Bone: No Bone: No Treatment Notes Electronic Signature(s) Signed: 08/07/2021 4:15:31 PM By: Donnamarie Poag Entered By: Donnamarie Poag on 08/07/2021 15:52:53 Chesler, [name] ([id number]) Stuller, [name] ([id number]) -------------------------------------------------------------------------------- Multi-Disciplinary Care Plan Details Patient Name: [name] Date of Service: 08/07/2021 3:30 PM Medical Record Number: [id number] Patient Account Number: [id number] Date of Birth/Sex: 09/12/1955 (66 y.o. M) Treating RN: Donnamarie Poag Primary Care Fitzroy Mikami: [name] Other Clinician: Referring  Raffaele Derise: [name] Treating Jomel Whittlesey/Extender: Skipper Cliche in Treatment: 10 Active Inactive Wound/Skin Impairment Nursing Diagnoses: Impaired tissue integrity Knowledge deficit related to smoking impact on wound healing Knowledge deficit related to ulceration/compromised skin integrity Goals: Patient/caregiver will verbalize understanding of skin care regimen Date Initiated: 05/29/2021 Date Inactivated: 07/24/2021 Target Resolution Date: 06/14/2021 Goal Status: Met Ulcer/skin breakdown will have a volume reduction of 30% by week 4 Date Initiated: 05/29/2021 Target Resolution Date: 06/28/2021 Goal Status: Active Ulcer/skin breakdown will have a volume reduction of 50% by week 8 Date Initiated: 05/29/2021 Target Resolution Date: 07/29/2021 Goal Status: Active Ulcer/skin breakdown will have a volume reduction of 80% by week 12 Date Initiated: 05/29/2021 Target Resolution Date: 08/28/2021 Goal Status: Active Interventions: Assess patient/caregiver ability to obtain necessary supplies Assess patient/caregiver ability to perform ulcer/skin care regimen upon admission and as needed Assess ulceration(s) every visit Notes: Electronic Signature(s) Signed: 08/07/2021 4:15:31 PM By: Donnamarie Poag Entered By: Donnamarie Poag on 08/07/2021 15:44:53 Cambre, [name] ([id number]) -------------------------------------------------------------------------------- Pain Assessment Details Patient Name: [name] Date of Service: 08/07/2021 3:30 PM Medical Record Number: [id number] Patient Account Number: [id number] Date of Birth/Sex: [birth year] (66 y.o. M) Treating RN: Donnamarie Poag Primary Care Raymonde Hamblin: [name] Other Clinician: Referring Shanira Tine: [name] Treating Shreyas Piatkowski/Extender: Skipper Cliche in Treatment: 10 Active Problems Location of Pain Severity and Description of Pain Patient Has Paino No Site Locations Rate the pain. Current Pain Level:  0 Pain Management and Medication Current Pain Management: Electronic Signature(s) Signed: 08/07/2021 4:15:31 PM By: Donnamarie Poag Entered By: Donnamarie Poag on 08/07/2021 15:39:14 Millstein, [name] ([id number]) -------------------------------------------------------------------------------- Patient/Caregiver Education Details Patient Name: [name] Date of Service: 08/07/2021 3:30 PM Medical Record Number: [id number] Patient Account Number: [id number] Date of Birth/Gender: May 25, 1956 (66 y.o. M) Treating RN: Donnamarie Poag Primary Care Physician: [name] Other Clinician: Referring Physician: [name] Treating Physician/Extender: Skipper Cliche in Treatment: 10 Education Assessment Education Provided To: Patient and Caregiver Education Topics Provided Basic Hygiene: Nutrition: Offloading: Wound/Skin Impairment: Notes BP education and monitoring and reporting to PCP Electronic Signature(s) Signed: 08/07/2021 4:15:31 PM By: Donnamarie Poag Entered By: Donnamarie Poag on 08/07/2021 15:54:20 Favorite, [name] ([id number]) -------------------------------------------------------------------------------- Wound Assessment Details Patient Name: [name] Date of Service: 08/07/2021 3:30 PM Medical Record Number: [id number] Patient Account Number: [id number] Date of Birth/Sex: January 26, 1956 (66 y.o. 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 Wounding Event: Gradually Appeared Etiology: Date Acquired: 02/16/2021 Wound Open Weeks  Of Treatment: 10 Status: Clustered Wound: No Comorbid Anemia, Sleep Apnea, Coronary Artery Disease, Deep History: Vein Thrombosis,  Hypertension, Type II Diabetes, History of pressure wounds, Neuropathy Photos Wound Measurements Length: (cm) 1 Width: (cm) 2.3 Depth: (cm) 0.1 Area: (cm) 1.806 Volume: (cm) 0.181 % Reduction in Area: -27.7% % Reduction in Volume: -28.4% Wound Description Classification: Category/Stage III Exudate Amount: Medium Exudate Type: Serosanguineous Exudate Color: red, brown Foul Odor After Cleansing: No Slough/Fibrino Yes Wound Bed Granulation Amount: Small (1-33%) Exposed Structure Granulation Quality: Red, Pink Fascia Exposed: No Necrotic Amount: Large (67-100%) Fat Layer (Subcutaneous Tissue) Exposed: Yes Necrotic Quality: Eschar, Adherent Slough Tendon Exposed: No Muscle Exposed: No Joint Exposed: No Bone Exposed: No Treatment Notes Wound #1 (Calcaneus) Wound Laterality: Right Cleanser Byram Ancillary Kit - 15 Day Supply Discharge Instruction: Use supplies as instructed; Kit contains: (15) Saline Bullets; (15) 3x3 Gauze; 15 pr Gloves [place], [place] ([id number]) Normal Saline Discharge Instruction: Wash your hands with soap and water. Remove old dressing, discard into plastic bag and place into trash. Cleanse the wound with Normal Saline prior to applying a clean dressing using gauze sponges, not tissues or cotton balls. Do not scrub or use excessive force. Pat dry using gauze sponges, not tissue or cotton balls. Soap and Water Discharge Instruction: Gently cleanse wound with antibacterial soap, rinse and pat dry prior to dressing wounds Peri-Wound Care Topical Primary Dressing Silvercel Small 2x2 (in/in) Discharge Instruction: Apply Silvercel Small 2x2 (in/in) as instructed Secondary Dressing Mepilex Border Flex, 4x4 (in/in) Discharge Instruction: Apply to wound as directed. Do not cut. Secured With Compression Wrap Compression Stockings Environmental education officer) Signed: 08/07/2021 4:15:31 PM By: Donnamarie Poag Entered By: Donnamarie Poag on 08/07/2021  15:43:48 Mortellaro, [name] ([id number]) -------------------------------------------------------------------------------- Wound Assessment Details Patient Name: [name] Date of Service: 08/07/2021 3:30 PM Medical Record Number: [id number] Patient Account Number: [id number] Date of Birth/Sex: Jan 20, 1956 (66 y.o. M) Treating RN: Donnamarie Poag Primary Care Legacy Lacivita: [name] Other Clinician: Referring Advay Volante: [name] Treating Elois Averitt/Extender: Skipper Cliche in Treatment: 10 Wound Status Wound Number: 2 Primary Pressure Ulcer Etiology: Wound Location: Sacrum Wound Open Wounding Event: Gradually Appeared Status: Date Acquired: 07/21/2018 Comorbid Anemia, Sleep Apnea, Coronary Artery Disease, Deep Vein Weeks Of Treatment: 10 History: Thrombosis, Hypertension, Type II Diabetes, History of Clustered Wound: No pressure wounds, Neuropathy Photos Wound Measurements Length: (cm) 3 Width: (cm) 1.2 Depth: (cm) 2.8 Area: (cm) 2.827 Volume: (cm) 7.917 % Reduction in Area: 91.5% % Reduction in Volume: 93.4% Wound Description Classification: Category/Stage IV Exudate Amount: Large Exudate Type: Serosanguineous Exudate Color: red, brown Foul Odor After Cleansing: No Slough/Fibrino Yes Wound Bed Granulation Amount: Large (67-100%) Exposed Structure Granulation Quality: Red, Pink Fascia Exposed: No Necrotic Amount: Small (1-33%) Fat Layer (Subcutaneous Tissue) Exposed: Yes Necrotic Quality: Adherent Slough Tendon Exposed: No Muscle Exposed: Yes Necrosis of Muscle: No Joint Exposed: No Bone Exposed: No Treatment Notes Wound #2 (Sacrum) Cleanser Byram Ancillary Kit - 15 Day Supply Discharge Instruction: Use supplies as instructed; Kit contains: (15) Saline Bullets; (15) 3x3 Gauze; 15 pr Gloves Dakin 16 (oz) 0.25 Mccarey, Camdan ([id number]) Discharge Instruction: Use as directed. Normal Saline Discharge Instruction: Wash your hands  with soap and water. Remove old dressing, discard into plastic bag and place into trash. Cleanse the wound with Normal Saline prior to applying a clean dressing using gauze sponges, not tissues or cotton balls. Do not scrub or use excessive force. Pat dry using gauze sponges, not tissue or cotton balls. Soap and Water Discharge Instruction: Gently  cleanse wound with antibacterial soap, rinse and pat dry prior to dressing wounds Peri-Wound Care Topical Primary Dressing Gauze Discharge Instruction: Dakins gauze-As directed: dry, moistened with saline or moistened with Dakins Solution Secondary Dressing ABD Pad 5x9 (in/in) Discharge Instruction: Cover with ABD pad Secured With 61M Medipore H Soft Cloth Surgical Tape, 2x2 (in/yd) Compression Wrap Compression Stockings Add-Ons Electronic Signature(s) Signed: 08/07/2021 4:15:31 PM By: Donnamarie Poag Entered By: Donnamarie Poag on 08/07/2021 15:44:23 Brunson, [name] ([id number]) -------------------------------------------------------------------------------- [place] Details Patient Name: [name] Date of Service: 08/07/2021 3:30 PM Medical Record Number: [id number] Patient Account Number: [id number] Date of Birth/Sex: December 03, 1955 (66 y.o. M) Treating RN: Donnamarie Poag Primary Care Jaimin Krupka: [name] Other Clinician: Referring Yuritzi Kamp: [name] Treating Niyanna Asch/Extender: Skipper Cliche in Treatment: 10 Vital Signs Time Taken: 15:35 Temperature (F): 97.9 Height (in): 66 Pulse (bpm): 86 Respiratory Rate (breaths/min): 18 Blood Pressure (mmHg): 170/100 Reference Range: 80 - 120 mg / dl Electronic Signature(s) Signed: 08/07/2021 4:15:31 PM By: Donnamarie Poag Entered ByDonnamarie Poag on 08/07/2021 15:37:06

## 2021-08-09 ENCOUNTER — Ambulatory Visit: Payer: Self-pay | Admitting: Urology

## 2021-08-09 ENCOUNTER — Ambulatory Visit: Payer: Medicare Other | Admitting: Urology

## 2021-08-09 ENCOUNTER — Other Ambulatory Visit: Payer: Self-pay

## 2021-08-09 ENCOUNTER — Encounter: Payer: Self-pay | Admitting: Urology

## 2021-08-09 VITALS — BP 120/75 | HR 103 | Ht 66.0 in | Wt 218.0 lb

## 2021-08-09 DIAGNOSIS — N3000 Acute cystitis without hematuria: Secondary | ICD-10-CM

## 2021-08-09 DIAGNOSIS — N319 Neuromuscular dysfunction of bladder, unspecified: Secondary | ICD-10-CM | POA: Diagnosis not present

## 2021-08-09 DIAGNOSIS — N390 Urinary tract infection, site not specified: Secondary | ICD-10-CM | POA: Diagnosis not present

## 2021-08-09 LAB — URINALYSIS, COMPLETE
Bilirubin, UA: NEGATIVE
Glucose, UA: NEGATIVE
Ketones, UA: NEGATIVE
Nitrite, UA: POSITIVE — AB
Specific Gravity, UA: 1.025 (ref 1.005–1.030)
Urobilinogen, Ur: 0.2 mg/dL (ref 0.2–1.0)
pH, UA: 6 (ref 5.0–7.5)

## 2021-08-09 LAB — MICROSCOPIC EXAMINATION: WBC, UA: >30 /hpf — AB (ref 0–5)

## 2021-08-09 NOTE — Progress Notes (Signed)
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  He previously was managing his bladder with catheterization 3-4 times per day, but since his health has declined over the last 18 months he has had an indwelling Foley that is changed monthly by home health nursing secondary to his frailty, weakness, and inability to catheterize.   He also had a history of some right-sided hydronephrosis on a renal ultrasound in February 2022, and I recommended repeat imaging to confirm resolution.  I personally viewed and interpreted the CT abdomen and pelvis with contrast from December 2022 as well as the recent renal ultrasound from 08/03/2021.  Bladder is decompressed with chronic Foley in place, and there is no hydronephrosis.  He was recently admitted in January for a UTI versus sacral wound infection, and was treated with Keflex.  Urine culture on 07/28/2021 grew Proteus, and he just finished his course of Keflex 2 days ago.  Foley catheter was changed at that time during that admission.  His partner with him today provides all of the history.  She is concerned he could have another UTI as he seems more weak to her over the last few days, and the urine is cloudy.  We discussed this would be less likely with his recent course of antibiotics, however we will send a clean urine culture from the catheter today and follow-up those results.  I recommended starting cranberry tablets to help prevent UTIs in the future.  She also has some concerns as he is having some leakage around the catheter over the last few months, especially  overnight.  The bladder has been decompressed on imaging, and this may be related to bladder spasm.  I recommended a trial of Myrbetriq 50 mg daily, and samples were given.  She does not feel like he is strong enough to resume intermittent catheterization, and would like to continue with the Foley catheter with monthly changes.  -Trial of Myrbetriq for bladder spasms/leakage around [facility] to fill prescription if helpful -Call with urine culture results -Continue home health nursing Foley changes monthly -Continue yearly follow-up with urology   Sondra Come, MD  [facility] [address], [address] [place], [place] [address] [phone]

## 2021-08-12 LAB — CULTURE, URINE COMPREHENSIVE

## 2021-08-14 ENCOUNTER — Telehealth: Payer: Self-pay

## 2021-08-14 DIAGNOSIS — R3989 Other symptoms and signs involving the genitourinary system: Secondary | ICD-10-CM

## 2021-08-14 MED ORDER — CIPROFLOXACIN HCL 500 MG PO TABS: 500.0000 mg | ORAL_TABLET | Freq: Two times a day (BID) | ORAL | 0 refills | Status: AC

## 2021-08-14 NOTE — Telephone Encounter (Signed)
Called pt informed him of the information below. Pt voiced understanding. RX sent in.  ?

## 2021-08-14 NOTE — Telephone Encounter (Signed)
-----   Message from Sondra Come, MD sent at 08/11/2021 10:14 AM EST ----- Recommend cipro 500mg  BID x 10 days for suspected UTI  , MD 08/11/2021

## 2021-08-21 ENCOUNTER — Other Ambulatory Visit: Payer: Self-pay

## 2021-08-21 ENCOUNTER — Encounter: Payer: Medicare Other | Admitting: Physician Assistant

## 2021-08-21 DIAGNOSIS — L89613 Pressure ulcer of right heel, stage 3: Secondary | ICD-10-CM | POA: Diagnosis not present

## 2021-08-21 NOTE — Progress Notes (Addendum)
[name], DELAY ([id number]) Visit Report for 08/21/2021 Chief Complaint Document Details Patient Name: [name], [name] Date of Service: 08/21/2021 8:00 AM Medical Record Number: [id number] Patient Account Number: [id number] Date of Birth/Sex: [birth year] (65 y.o. M) Treating RN: [name] Primary Care Provider: [name] Other Clinician: Referring Provider: [name] Treating Provider/Extender: Skipper Cliche in Treatment: 12 Information Obtained from: Patient Chief Complaint Sacral, right gluteal, and right heel ulcers Electronic Signature(s) Signed: 08/21/2021 8:20:35 AM By: [name] PA-C Entered By: [name] on 08/21/2021 08:20:35 Haro, [name] ([id number]) -------------------------------------------------------------------------------- Debridement Details Patient Name: [name] Date of Service: 08/21/2021 8:00 AM Medical Record Number: [id number] Patient Account Number: [id number] Date of Birth/Sex: 01-23-1956 (65 y.o. M) Treating RN: [name] Primary Care Provider: [name] Other Clinician: Referring Provider: [name] Treating Provider/Extender: Skipper Cliche in Treatment: 12 Debridement Performed for Wound #1 Right Calcaneus Assessment: Performed By: Physician Tommie Sams., PA-C Debridement Type: Debridement Severity of Tissue Pre Debridement: Fat layer exposed Level of Consciousness (Pre- Awake and Alert procedure): Pre-procedure Verification/Time Out Yes - 08:27 Taken: Start Time: 08:27 Total Area Debrided (L x W): 1.3 (cm) x 2 (cm) = 2.6 (cm) Tissue and other material Viable, Non-Viable, Subcutaneous debrided: Level: Skin/Subcutaneous Tissue Debridement Description: Excisional Instrument: Curette Bleeding: Minimum Hemostasis Achieved: Pressure End Time: 08:30 Procedural Pain: 0 Post Procedural Pain: 0 Response to Treatment: Procedure was tolerated well Level of  Consciousness (Post- Awake and Alert procedure): Post Debridement Measurements of Total Wound Length: (cm) 1.3 Stage: Category/Stage III Width: (cm) 2 Depth: (cm) 1 Volume: (cm) 2.042 Character of Wound/Ulcer Post Debridement: Improved Severity of Tissue Post Debridement: Fat layer exposed Post Procedure Diagnosis Same as Pre-procedure Electronic Signature(s) Signed: 08/21/2021 4:50:19 PM By: [name] PA-C Signed: 08/22/2021 3:39:51 PM By: [name] RN Entered By: [name] on 08/21/2021 08:31:04 Beharry, [name] ([id number]) -------------------------------------------------------------------------------- HPI Details Patient Name: [name] Date of Service: 08/21/2021 8:00 AM Medical Record Number: [id number] Patient Account Number: [id number] Date of Birth/Sex: [birth year] (65 y.o. M) Treating RN: [name] Primary Care Provider: [name] Other Clinician: Referring Provider: [name] Treating Provider/Extender: Skipper Cliche in Treatment: 12 History of Present Illness HPI Description: 05/29/2021 this is a patient who presents today for initial inspection here in the clinic concerning wounds that he has over the right heel and the sacral region. Unfortunately the sacral wound is starting to spread off to the right gluteal location due to how he sits always leaning towards the right side in his chair. His wife is present she is the primary caregiver though she is not home with him all the time she does have to work. She does do an excellent job however it appears to be in trying to keep things under good control for him. The patient is not able to change positions himself nor walk by himself so he is pretty much at the mercy of the position he is putting when she is gone and this tends to be his chair which she sits and most of the day. Obviously this I think is the main culprit for what is going on currently. It was actually in  January 2020 when the sacral wound started. It was in September 2022 when the wound started to spread more to the right gluteal location. Subsequently in August 2022 is when he had been in a skilled nursing facility and the heel started to give him trouble as well. That does not seem to be doing nearly as  poorly as the sacral region. He was hospitalized in October 2022 secondary to sepsis and this was in regard to the foot and was sent to skilled nursing again he is now back at home. He did have a wound VAC for the sacral wound over the summer 2022 but being in and out of facilities this ended up getting sent back. The patient does have [facility] home health that comes out 1 time per week to help with care. His most recent hemoglobin A1c was 6.9 in August 2022. Patient's met past medical history includes generalized muscle weakness, bipolar disorder, diabetes mellitus type 2, hypertension, long-term use of anticoagulant therapy due to frequent blood clots/DVTs. He also has a history of traumatic brain injury. 07/24/2021 upon evaluation today patient appears to be doing decently well in regard to the pressure ulcer on the right heel as well as the sacral region. In general I think you are making some progress here which is great news. Overall the heel unfortunately had already closed previously when we saw him although it apparently reopened when he was working with physical therapy according to his wife. The area in the sacral region is doing well and looks clean there is still some depth here but I still think it would be difficult to wound VAC this region. His wife does an awesome job taking care of him. Is been so long since we have seen him because he has been in the hospital to be honest. 08/07/2021 upon evaluation today patient appears to be doing well at this time. Fortunately I do not see any signs of active infection locally or systemically at this time which is great news. No fevers, chills,  nausea, vomiting, or diarrhea. Unfortunately after I saw him on the 24th he actually ended up in the hospital in the 27th due to being septic. This was not due to the wounds but after looking at records actually due to a UTI. Fortunately he is doing much better and very happy in that regard. I do not see any signs of infection locally nor systemically at this time. 08/21/2021 upon evaluation today patient appears to be doing well with regard to his wound. Fortunately I think that the sacral region is doing decently well at this point which is great news. With regard to the foot this also does have some slough noted but I feel like we are making progress here. He does have some irritation around the right upper thigh/gluteal region. I feel like it is more towards the thigh. Nonetheless this does appear to be pressure related he spends a lot of time sitting up his caregiver states he really will not get in the bed and stay there like he should. Electronic Signature(s) Signed: 08/21/2021 9:02:13 AM By: [name] PA-C Entered By: [name] on 08/21/2021 09:02:13 Kanaan, [name] ([id number]) -------------------------------------------------------------------------------- Physical Exam Details Patient Name: [name] Date of Service: 08/21/2021 8:00 AM Medical Record Number: [id number] Patient Account Number: [id number] Date of Birth/Sex: 1955/12/01 (65 y.o. M) Treating RN: [name] Primary Care Provider: [name] Other Clinician: Referring Provider: [name] Treating Provider/Extender: Skipper Cliche in Treatment: 64 Constitutional Well-nourished and well-hydrated in no acute distress. Respiratory normal breathing without difficulty. Psychiatric this patient is able to make decisions and demonstrates good insight into disease process. Alert and Oriented x 3. pleasant and cooperative. Notes Upon inspection patient's wound bed actually showed  signs of good granulation and epithelization at this point. Fortunately I do not see any  evidence of active infection locally nor systemically at this time which is great news. I do feel like the Dakin's moistened gauze packing into the sacral region is doing well for him. Electronic Signature(s) Signed: 08/21/2021 9:02:48 AM By: [name] PA-C Entered By: [name] on 08/21/2021 09:02:48 Weimer, [name] ([id number]) -------------------------------------------------------------------------------- Physician Orders Details Patient Name: [name] Date of Service: 08/21/2021 8:00 AM Medical Record Number: [id number] Patient Account Number: [id number] Date of Birth/Sex: [birth year] (65 y.o. M) Treating RN: [name] Primary Care Provider: [name] Other Clinician: Referring Provider: [name] Treating Provider/Extender: Skipper Cliche in Treatment: 12 Verbal / Phone Orders: No Diagnosis Coding ICD-10 Coding Code Description [phone] Pressure ulcer of right heel, stage 3 L89.154 Pressure ulcer of sacral region, stage 4 L89.892 Pressure ulcer of other site, stage 2 M62.81 Muscle weakness (generalized) F31.9 Bipolar disorder, unspecified E11.622 Type 2 diabetes mellitus with other skin ulcer I10 Essential (primary) hypertension Z79.01 Long term (current) use of anticoagulants 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 Follow-up Appointments o Return Appointment in 2 weeks. o Nurse Visit as needed [place] for wound care. May utilize formulary equivalent dressing for wound treatment orders unless otherwise specified. Home Health Nurse may visit [facility] to address patientos wound care needs. - for sacrum and right heel o Scheduled days for dressing changes to be completed; exception, patient has scheduled wound care visit that day. o **Please direct any NON-WOUND  related issues/requests for orders to patient's Primary Care Physician. **If current dressing causes regression in wound condition, may D/C ordered dressing product/s and apply Normal Saline Moist Dressing daily until next [place] or Other MD appointment. **Notify [facility] of regression in wound condition at [phone]. Bathing/ Shower/ Hygiene o May shower with wound dressing protected with water repellent cover or cast protector. o No tub bath. Anesthetic (Use 'Patient Medications' Section for Anesthetic Order Entry) o Lidocaine applied to wound bed Edema Control - Lymphedema / Segmental Compressive Device / Other o Elevate leg(s) parallel to the floor when sitting. o DO YOUR BEST to sleep in the bed at night. DO NOT sleep in your recliner. Long hours of sitting in a recliner leads to swelling of the legs and/or potential wounds on your backside. Non-Wound Condition o Additional non-wound orders/instructions: - AandD to excoriated areas of gluteus for protection where adhesive isn't needed to promote healing Off-Loading o Gel wheelchair cushion o Low air-loss mattress (Group 2) o Turn and reposition every 2 hours - keep pressure off of the sacrum and right heel wounds o Other: - PRAFO boot in bed keep pressure off of sacrum/gluteus and right heel- Additional Orders / Instructions o Follow Nutritious Diet and Increase Protein Intake Wound Treatment Wound #1 - Calcaneus Wound Laterality: Right Sick, [place] ([id number]) Cleanser: Byram Ancillary Kit - 15 Day Supply 1 x Per Day/15 Days Discharge Instructions: Use supplies as instructed; Kit contains: (15) Saline Bullets; (15) 3x3 Gauze; 15 pr Gloves Cleanser: Normal Saline 1 x Per Day/15 Days Discharge Instructions: Wash your hands with soap and water. Remove old dressing, discard into plastic bag and place into trash. Cleanse the wound with Normal Saline prior to applying a clean  dressing using gauze sponges, not tissues or cotton balls. Do not scrub or use excessive force. Pat dry using gauze sponges, not tissue or cotton balls. Cleanser: Soap and Water 1 x Per GQB/16 Days Discharge Instructions: Gently cleanse wound with antibacterial  soap, rinse and pat dry prior to dressing wounds Primary Dressing: Silvercel Small 2x2 (in/in) 1 x Per Day/15 Days Discharge Instructions: Apply Silvercel Small 2x2 (in/in) as instructed Secondary Dressing: Mepilex Border Flex, 4x4 (in/in) 1 x Per Day/15 Days Discharge Instructions: Apply to wound as directed. Do not cut. Wound #2 - Sacrum Cleanser: Byram Ancillary Kit - 15 Day Supply 1 x Per Day/15 Days Discharge Instructions: Use supplies as instructed; Kit contains: (15) Saline Bullets; (15) 3x3 Gauze; 15 pr Gloves Cleanser: Dakin 16 (oz) 0.25 1 x Per Day/15 Days Discharge Instructions: Use as directed. Cleanser: Normal Saline 1 x Per MBT/59 Days Discharge Instructions: Wash your hands with soap and water. Remove old dressing, discard into plastic bag and place into trash. Cleanse the wound with Normal Saline prior to applying a clean dressing using gauze sponges, not tissues or cotton balls. Do not scrub or use excessive force. Pat dry using gauze sponges, not tissue or cotton balls. Cleanser: Soap and Water 1 x Per RCB/63 Days Discharge Instructions: Gently cleanse wound with antibacterial soap, rinse and pat dry prior to dressing wounds Primary Dressing: Gauze 1 x Per Day/15 Days Discharge Instructions: Dakins gauze-As directed: dry, moistened with saline or moistened with Dakins Solution Secondary Dressing: ABD Pad 5x9 (in/in) 1 x Per Day/15 Days Discharge Instructions: Cover with ABD pad Secured With: 60M Medipore H Soft Cloth Surgical Tape, 2x2 (in/yd) 1 x Per Day/15 Days Wound #3 - Upper Leg Wound Laterality: Posterior Peri-Wound Care: AandD Ointment 1 x Per Day/30 Days Discharge Instructions: Apply AandD Ointment as  directed Electronic Signature(s) Signed: 08/21/2021 4:50:19 PM By: [name] PA-C Signed: 08/22/2021 3:39:51 PM By: [name] RN Entered By: [name] on 08/21/2021 08:35:37 Stacey, [name] ([id number]) -------------------------------------------------------------------------------- Problem List Details Patient Name: [name] Date of Service: 08/21/2021 8:00 AM Medical Record Number: [id number] Patient Account Number: [id number] Date of Birth/Sex: Jan 23, 1956 (65 y.o. M) Treating RN: [name] Primary Care Provider: [name] Other Clinician: Referring Provider: [name] Treating Provider/Extender: Skipper Cliche in Treatment: 12 Active Problems ICD-10 Encounter Code Description Active Date MDM Diagnosis [phone] Pressure ulcer of right heel, stage 3 05/29/2021 No Yes L89.154 Pressure ulcer of sacral region, stage 4 05/29/2021 No Yes L89.892 Pressure ulcer of other site, stage 2 08/21/2021 No Yes M62.81 Muscle weakness (generalized) 05/29/2021 No Yes F31.9 Bipolar disorder, unspecified 05/29/2021 No Yes E11.622 Type 2 diabetes mellitus with other skin ulcer 05/29/2021 No Yes I10 Essential (primary) hypertension 05/29/2021 No Yes Z79.01 Long term (current) use of anticoagulants 05/29/2021 No Yes 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 05/29/2021 No Yes Inactive Problems Resolved Problems Electronic Signature(s) Signed: 08/21/2021 8:27:06 AM By: [name] PA-C Previous Signature: 08/21/2021 8:20:28 AM Version By: [name] PA-C Entered By: [name] on 08/21/2021 08:27:06 Cronce, [name] ([id number]) -------------------------------------------------------------------------------- Progress Note Details Patient Name: [name] Date of Service: 08/21/2021 8:00 AM Medical Record Number: [id number] Patient Account Number: [id number] Date of Birth/Sex: 19-Jun-1956 (65 y.o. M) Treating RN:  [name] Primary Care Provider: [name] Other Clinician: Referring Provider: [name] Treating Provider/Extender: Skipper Cliche in Treatment: 12 Subjective Chief Complaint Information obtained from Patient Sacral, right gluteal, and right heel ulcers History of Present Illness (HPI) 05/29/2021 this is a patient who presents today for initial inspection here in the clinic concerning wounds that he has over the right heel and the sacral region. Unfortunately the sacral wound is starting to spread off to the right gluteal location due to  how he sits always leaning towards the right side in his chair. His wife is present she is the primary caregiver though she is not home with him all the time she does have to work. She does do an excellent job however it appears to be in trying to keep things under good control for him. The patient is not able to change positions himself nor walk by himself so he is pretty much at the mercy of the position he is putting when she is gone and this tends to be his chair which she sits and most of the day. Obviously this I think is the main culprit for what is going on currently. It was actually in January 2020 when the sacral wound started. It was in September 2022 when the wound started to spread more to the right gluteal location. Subsequently in August 2022 is when he had been in a skilled nursing facility and the heel started to give him trouble as well. That does not seem to be doing nearly as poorly as the sacral region. He was hospitalized in October 2022 secondary to sepsis and this was in regard to the foot and was sent to skilled nursing again he is now back at home. He did have a wound VAC for the sacral wound over the summer 2022 but being in and out of facilities this ended up getting sent back. The patient does have [facility] home health that comes out 1 time per week to help with care. His most recent hemoglobin A1c was 6.9 in  August 2022. Patient's met past medical history includes generalized muscle weakness, bipolar disorder, diabetes mellitus type 2, hypertension, long-term use of anticoagulant therapy due to frequent blood clots/DVTs. He also has a history of traumatic brain injury. 07/24/2021 upon evaluation today patient appears to be doing decently well in regard to the pressure ulcer on the right heel as well as the sacral region. In general I think you are making some progress here which is great news. Overall the heel unfortunately had already closed previously when we saw him although it apparently reopened when he was working with physical therapy according to his wife. The area in the sacral region is doing well and looks clean there is still some depth here but I still think it would be difficult to wound VAC this region. His wife does an awesome job taking care of him. Is been so long since we have seen him because he has been in the hospital to be honest. 08/07/2021 upon evaluation today patient appears to be doing well at this time. Fortunately I do not see any signs of active infection locally or systemically at this time which is great news. No fevers, chills, nausea, vomiting, or diarrhea. Unfortunately after I saw him on the 24th he actually ended up in the hospital in the 27th due to being septic. This was not due to the wounds but after looking at records actually due to a UTI. Fortunately he is doing much better and very happy in that regard. I do not see any signs of infection locally nor systemically at this time. 08/21/2021 upon evaluation today patient appears to be doing well with regard to his wound. Fortunately I think that the sacral region is doing decently well at this point which is great news. With regard to the foot this also does have some slough noted but I feel like we are making progress here. He does have some irritation around the right upper  thigh/gluteal region. I feel like it is  more towards the thigh. Nonetheless this does appear to be pressure related he spends a lot of time sitting up his caregiver states he really will not get in the bed and stay there like he should. Objective Constitutional Well-nourished and well-hydrated in no acute distress. Vitals Time Taken: 8:18 AM, Height: 66 in, Temperature: 97.7 F, Pulse: 84 bpm, Respiratory Rate: 18 breaths/min, Blood Pressure: 166/83 mmHg. Respiratory normal breathing without difficulty. Psychiatric this patient is able to make decisions and demonstrates good insight into disease process. Alert and Oriented x 3. pleasant and cooperative. KRATOS, RUSCITTI ([id number]) General Notes: Upon inspection patient's wound bed actually showed signs of good granulation and epithelization at this point. Fortunately I do not see any evidence of active infection locally nor systemically at this time which is great news. I do feel like the Dakin's moistened gauze packing into the sacral region is doing well for him. Integumentary (Hair, Skin) Wound #1 status is Open. Original cause of wound was Gradually Appeared. The date acquired was: 02/16/2021. The wound has been in treatment 12 weeks. The wound is located on the Right Calcaneus. The wound measures 1.3cm length x 2cm width x 0.1cm depth; 2.042cm^2 area and 0.204cm^3 volume. There is Fat Layer (Subcutaneous Tissue) exposed. There is no tunneling or undermining noted. There is a medium amount of serosanguineous drainage noted. There is small (1-33%) red, pink granulation within the wound bed. There is a large (67-100%) amount of necrotic tissue within the wound bed including Eschar and Adherent Slough. Wound #2 status is Open. Original cause of wound was Gradually Appeared. The date acquired was: 07/21/2018. The wound has been in treatment 12 weeks. The wound is located on the Sacrum. The wound measures 3.5cm length x 2cm width x 5.1cm depth; 5.498cm^2 area and 28.039cm^3  volume. There is muscle and Fat Layer (Subcutaneous Tissue) exposed. There is no tunneling or undermining noted. There is a large amount of serosanguineous drainage noted. There is large (67-100%) red, pink granulation within the wound bed. There is a small (1-33%) amount of necrotic tissue within the wound bed including Adherent Slough. Wound #3 status is Open. Original cause of wound was Gradually Appeared. The date acquired was: 08/14/2021. The wound is located on the Posterior Upper Leg. The wound measures 2cm length x 2cm width x 0.1cm depth; 3.142cm^2 area and 0.314cm^3 volume. There is Fat Layer (Subcutaneous Tissue) exposed. There is no tunneling or undermining noted. There is a medium amount of serosanguineous drainage noted. There is medium (34-66%) red, pink granulation within the wound bed. There is no necrotic tissue within the wound bed. 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 There was a Excisional Skin/Subcutaneous Tissue Debridement with a total area of 2.6 sq cm performed by Tommie Sams., PA-C. With the following instrument(s): Curette to remove Viable and Non-Viable tissue/material. Material removed includes Subcutaneous Tissue. No specimens were taken. A time out was conducted at 08:27, prior to the start of the procedure. A Minimum amount of bleeding was controlled with Pressure. The procedure was  tolerated well with a pain level of 0 throughout and a pain level of 0 following  the procedure. Post Debridement Measurements: 1.3cm length x 2cm width x 1cm depth; 2.042cm^3 volume. Post debridement Stage noted as Category/Stage III. Character of Wound/Ulcer Post Debridement is improved. Severity of Tissue Post Debridement is: Fat layer exposed. Post procedure Diagnosis Wound #1: Same as Pre-Procedure Plan Follow-up Appointments: Return Appointment in 2 weeks. Nurse Visit as needed Home Health: [place]: - [facility] for wound care. May utilize formulary equivalent dressing for wound treatment orders unless otherwise specified. Home Health Nurse may visit [facility] to address patient s wound care needs. - for sacrum and right heel Scheduled days for dressing changes to be completed; exception, patient has scheduled wound care visit that day. **Please direct any NON-WOUND related issues/requests for orders to patient's Primary Care Physician. **If current dressing causes regression in wound condition, may D/C ordered dressing product/s and apply Normal Saline Moist Dressing daily until next [place] or Other MD appointment. **Notify [facility] of regression in wound condition at [phone]. NIMESH, RIOLO ([id number]) Bathing/ Shower/ Hygiene: May shower with wound dressing protected with water repellent cover or cast protector. No tub bath. Anesthetic (Use 'Patient Medications' Section for Anesthetic Order Entry): Lidocaine applied to wound bed Edema Control - Lymphedema / Segmental Compressive Device / Other: Elevate leg(s) parallel to the floor when sitting. DO YOUR BEST to sleep in the bed at night. DO NOT sleep in your recliner. Long hours of sitting in a recliner leads to swelling of the legs and/or potential wounds on your backside. Non-Wound Condition: Additional non-wound orders/instructions: - AandD to excoriated areas of gluteus for protection where adhesive isn't needed to promote  healing Off-Loading: Gel wheelchair cushion Low air-loss mattress (Group 2) Turn and reposition every 2 hours - keep pressure off of the sacrum and right heel wounds Other: - PRAFO boot in bed keep pressure off of sacrum/gluteus and right heel- Additional Orders / Instructions: Follow Nutritious Diet and Increase Protein Intake WOUND #1: - Calcaneus Wound Laterality: Right Cleanser: Byram Ancillary Kit - 15 Day Supply 1 x Per Day/15 Days Discharge Instructions: Use supplies as instructed; Kit contains: (15) Saline Bullets; (15) 3x3 Gauze; 15 pr Gloves Cleanser: Normal Saline 1 x Per Day/15 Days Discharge Instructions: Wash your hands with soap and water. Remove old dressing, discard into plastic bag and place into trash. Cleanse the wound with Normal Saline prior to applying a clean dressing using gauze sponges, not tissues or cotton balls. Do not scrub or use excessive force. Pat dry using gauze sponges, not tissue or cotton balls. Cleanser: Soap and Water 1 x Per Day/15 Days Discharge Instructions: Gently cleanse wound with antibacterial soap, rinse and pat dry prior to dressing wounds Primary Dressing: Silvercel Small 2x2 (in/in) 1 x Per Day/15 Days Discharge Instructions: Apply Silvercel Small 2x2 (in/in) as instructed Secondary Dressing: Mepilex Border Flex, 4x4 (in/in) 1 x Per Day/15 Days Discharge Instructions: Apply to wound as directed. Do not cut. WOUND #2: - Sacrum Wound Laterality: Cleanser: Byram Ancillary Kit - 15 Day Supply 1 x Per Day/15 Days Discharge Instructions: Use supplies as instructed; Kit contains: (15) Saline Bullets; (15) 3x3 Gauze; 15 pr Gloves Cleanser: Dakin 16 (oz) 0.25 1 x Per Day/15 Days Discharge Instructions: Use as directed. Cleanser: Normal Saline 1 x Per WGN/56 Days Discharge Instructions: Wash your hands with soap and water. Remove old dressing, discard into plastic bag and place into trash. Cleanse  the wound with Normal Saline prior to applying a  clean dressing using gauze sponges, not tissues or cotton balls. Do not scrub or use excessive force. Pat dry using gauze sponges, not tissue or cotton balls. Cleanser: Soap and Water 1 x Per Day/15 Days Discharge Instructions: Gently cleanse wound with antibacterial soap, rinse and pat dry prior to dressing wounds Primary Dressing: Gauze 1 x Per Day/15 Days Discharge Instructions: Dakins gauze-As directed: dry, moistened with saline or moistened with Dakins Solution Secondary Dressing: ABD Pad 5x9 (in/in) 1 x Per Day/15 Days Discharge Instructions: Cover with ABD pad Secured With: 58M Medipore H Soft Cloth Surgical Tape, 2x2 (in/yd) 1 x Per Day/15 Days WOUND #3: - Upper Leg Wound Laterality: Posterior Peri-Wound Care: AandD Ointment 1 x Per Day/30 Days Discharge Instructions: Apply AandD Ointment as directed 1. Would recommend that we go ahead and continue with the silver alginate to the foot/heel location which I think is doing quite well. 2. I am going to recommend that we use the Dakin's moistened gauze packing to the sacral region which is still I believe doing well. 3. I am also can recommend at this time that we have the patient continue with the AandD ointment applied to the opening of the right upper thigh region posteriorly where honestly I feel like this may do best to help this area heal up effectively in keeping it protected. We will see patient back for reevaluation in 1 week here in the clinic. If anything worsens or changes patient will contact our office for additional recommendations. Electronic Signature(s) Signed: 08/21/2021 9:03:24 AM By: [name] PA-C Entered By: [name] on 08/21/2021 09:03:23 Porada, [name] ([id number]) -------------------------------------------------------------------------------- SuperBill Details Patient Name: [name] Date of Service: 08/21/2021 Medical Record Number: [id number] Patient Account Number:  [id number] Date of Birth/Sex: 20-Apr-1956 (65 y.o. 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 Pressure ulcer of other site, stage 2 M62.81 Muscle weakness (generalized) F31.9 Bipolar disorder, unspecified E11.622 Type 2 diabetes mellitus with other skin ulcer I10 Essential (primary) hypertension Z79.01 Long term (current) use of anticoagulants 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 Facility Procedures CPT4 Code: [id number] Description: 11042 - DEB SUBQ TISSUE 20 SQ CM/< Modifier: Quantity: 1 CPT4 Code: Description: ICD-10 Diagnosis Description L89.613 Pressure ulcer of right heel, stage 3 Modifier: Quantity: Physician Procedures CPT4 Code: 8032122 Description: 99214 - WC PHYS LEVEL 4 - EST PT Modifier: 25 Quantity: 1 CPT4 Code: Description: ICD-10 Diagnosis Description L89.613 Pressure ulcer of right heel, 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

## 2021-08-22 NOTE — Progress Notes (Signed)
TORRION, WITTER ([id number]) Visit Report for 08/21/2021 Arrival Information Details Patient Name: [name], [name] Date of Service: 08/21/2021 8:00 AM Medical Record Number: [id number] Patient Account Number: [id number] Date of Birth/Sex: September 11, 1955 (65 y.o. M) Treating RN: [name] Primary Care Elliet Goodnow: [name] Other Clinician: Referring Arisha Gervais: [name] Treating Junetta Hearn/Extender: Skipper Cliche in Treatment: 12 Visit Information History Since Last Visit All ordered tests and consults were completed: No Patient Arrived: Wheel Chair Added or deleted any medications: No Arrival Time: 08:17 Any new allergies or adverse reactions: No Accompanied By: wife Had a fall or experienced change in No Transfer Assistance: None activities of daily living that may affect Patient Identification Verified: Yes risk of falls: Secondary Verification Process Completed: Yes Signs or symptoms of abuse/neglect since last visito No Patient Requires Transmission-Based No Hospitalized since last visit: No Precautions: Implantable device outside of the clinic excluding No Patient Has Alerts: Yes cellular tissue based products placed in the center Patient Alerts: Patient on Blood since last visit: Thinner Has Dressing in Place as Prescribed: Yes DIABETIC Pain Present Now: No Eliquis Electronic Signature(s) Signed: 08/22/2021 3:39:51 PM By: [name] RN Entered By: [name] on 08/21/2021 08:18:17 Germer, [name] ([id number]) -------------------------------------------------------------------------------- Clinic Level of Care Assessment Details Patient Name: [name] Date of Service: 08/21/2021 8:00 AM Medical Record Number: [id number] Patient Account Number: [id number] Date of Birth/Sex: Sep 20, 1955 (65 y.o. M) Treating RN: [name] Primary Care Jahdiel Krol: [name] Other Clinician: Referring Bryona Foxworthy: [name] Treating Pasqualino Witherspoon/Extender: Skipper Cliche in Treatment: 12 Clinic Level of Care Assessment Items TOOL 1 Quantity Score []  - Use when EandM and Procedure is performed on INITIAL visit 0 ASSESSMENTS - Nursing Assessment / Reassessment []  - General Physical Exam (combine w/ comprehensive assessment (listed just below) when performed on new 0 pt. evals) []  - 0 Comprehensive Assessment (HX, ROS, Risk Assessments, Wounds Hx, etc.) ASSESSMENTS - Wound and Skin Assessment / Reassessment []  - Dermatologic / Skin Assessment (not related to wound area) 0 ASSESSMENTS - Ostomy and/or Continence Assessment and Care []  - Incontinence Assessment and Management 0 []  - 0 Ostomy Care Assessment and Management (repouching, etc.) PROCESS - Coordination of Care []  - Simple Patient / Family Education for ongoing care 0 []  - 0 Complex (extensive) Patient / Family Education for ongoing care []  - 0 Staff obtains Programmer, systems, Records, Test Results / Process Orders []  - 0 Staff telephones HHA, Nursing Homes / Clarify orders / etc []  - 0 Routine Transfer to another Facility (non-emergent condition) []  - 0 Routine Hospital Admission (non-emergent condition) []  - 0 New Admissions / Biomedical engineer / Ordering NPWT, Apligraf, etc. []  - 0 Emergency Hospital Admission (emergent condition) PROCESS - Special Needs []  - Pediatric / Minor Patient Management 0 []  - 0 Isolation Patient Management []  - 0 Hearing / Language / Visual special needs []  - 0 Assessment of Community assistance (transportation, D/C planning, etc.) []  - 0 Additional assistance / Altered mentation []  - 0 Support Surface(s) Assessment (bed, cushion, seat, etc.) INTERVENTIONS - Miscellaneous []  - External ear exam 0 []  - 0 Patient Transfer (multiple staff / Civil Service fast streamer / Similar devices) []  - 0 Simple Staple / Suture removal (25 or less) []  - 0 Complex Staple / Suture removal (26 or more) []  - 0 Hypo/Hyperglycemic  Management (do not check if billed separately) []  - 0 Ankle / Brachial Index (ABI) - do not check if billed separately Has the patient been seen at the hospital within the last three years:  Yes Total Score: 0 Level Of Care: ____ [name] ([id number]) Electronic Signature(s) Signed: 08/22/2021 3:39:51 PM By: [name] RN Entered By: [name] on 08/21/2021 08:37:28 Sullenger, [name] ([id number]) -------------------------------------------------------------------------------- Encounter Discharge Information Details Patient Name: [name] Date of Service: 08/21/2021 8:00 AM Medical Record Number: [id number] Patient Account Number: [id number] Date of Birth/Sex: 1956/05/12 (65 y.o. M) Treating RN: [name] Primary Care Noha Karasik: [name] Other Clinician: Referring Makiya Jeune: [name] Treating Daleysa Kristiansen/Extender: Skipper Cliche in Treatment: 12 Encounter Discharge Information Items Post Procedure Vitals Discharge Condition: Stable Temperature (F): 97.8 Ambulatory Status: Wheelchair Pulse (bpm): 84 Discharge Destination: Home Respiratory Rate (breaths/min): 18 Transportation: Private Auto Blood Pressure (mmHg): 166/83 Accompanied By: wife Schedule Follow-up Appointment: Yes Clinical Summary of Care: Patient Declined Electronic Signature(s) Signed: 08/21/2021 9:25:45 AM By: [name] RN Entered By: [name] on 08/21/2021 09:25:45 Gensel, [name] ([id number]) -------------------------------------------------------------------------------- Lower Extremity Assessment Details Patient Name: [name] Date of Service: 08/21/2021 8:00 AM Medical Record Number: [id number] Patient Account Number: [id number] Date of Birth/Sex: [birth year] (65 y.o. M) Treating RN: [name] Primary Care Twania Bujak: [name] Other Clinician: Referring Mieczyslaw Stamas: [name] Treating Lesleyann Fichter/Extender: Jeri Cos Weeks in Treatment: 12 Electronic Signature(s) Signed: 08/21/2021 8:26:09 AM By: [name] RN Entered By: [name] on 08/21/2021 08:26:08 [name], [name] ([id number]) -------------------------------------------------------------------------------- Multi Wound Chart Details Patient Name: [name] Date of Service: 08/21/2021 8:00 AM Medical Record Number: [id number] Patient Account Number: [id number] Date of Birth/Sex: 07/30/1955 (65 y.o. 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 Comorbid History: Anemia, Sleep Apnea, Coronary Anemia, Sleep Apnea, Coronary Anemia, Sleep Apnea, Coronary Artery Disease, Deep Vein Artery Disease, Deep Vein Artery Disease, Deep Vein Thrombosis, Hypertension, Type II Thrombosis, Hypertension, Type II Thrombosis, Hypertension, Type II Diabetes, History of pressure Diabetes, History of pressure Diabetes, History of pressure wounds, Neuropathy wounds, Neuropathy wounds, Neuropathy Date Acquired: 02/16/2021 07/21/2018 08/14/2021 Weeks of Treatment: 12 12 0 Wound Status: Open Open Open Wound Recurrence: No No No Measurements L x W x D (cm) 1.3x2x0.1 3.5x2x5.1 2x2x0.1 Area (cm) : 2.042 5.498 3.142 Volume (cm) : 0.204 28.039 0.314 % Reduction in Area: -44.40% 83.50% 0.00% % Reduction in Volume: -44.70% 76.70% 0.00% Classification:  Category/Stage III Category/Stage IV Category/Stage II Exudate Amount: Medium Large Medium Exudate Type: Serosanguineous Serosanguineous Serosanguineous Exudate Color: red, brown red, brown red, brown Granulation Amount: Small (1-33%) Large (67-100%) Medium (34-66%) Granulation Quality: Red, [name] Red, [name] Red, [name] Necrotic Amount: Large (67-100%) Small (1-33%) None Present (0%) Necrotic Tissue: Eschar, Adherent Satartia N/A Exposed Structures: Fat Layer (Subcutaneous Tissue): Fat Layer (Subcutaneous Tissue): Fat Layer (Subcutaneous Tissue): Yes Yes Yes Fascia: No Muscle: Yes Fascia: No Tendon: No Fascia: No Tendon: No Muscle: No Tendon: No Muscle: No Joint: No Joint: No Joint: No Bone: No Bone: No Bone: No Epithelialization: None None None Debridement: Debridement - Excisional N/A N/A Pre-procedure Verification/Time 08:27 N/A N/A Out Taken: Tissue Debrided: Subcutaneous N/A N/A Level: Skin/Subcutaneous Tissue N/A N/A Debridement Area (sq cm): 2.6 N/A N/A Instrument: Curette N/A N/A Bleeding: Minimum N/A N/A [name], [name] ([id number]) Hemostasis Achieved: Pressure N/A  N/A Procedural Pain: 0 N/A N/A Post Procedural Pain: 0 N/A N/A Debridement Treatment Procedure was tolerated well N/A N/A Response: Post Debridement 1.3x2x1 N/A N/A Measurements L x W x D (cm) Post Debridement Volume: 2.042 N/A N/A (cm) Post Debridement Stage: Category/Stage III N/A N/A Procedures Performed: Debridement N/A N/A Treatment Notes Electronic Signature(s) Signed: 08/21/2021 9:24:35 AM By: [name] RN Entered By: [name] on 08/21/2021 09:24:35 [name], [name] ([id number]) -------------------------------------------------------------------------------- [place] Details Patient Name: [name] Date of Service: 08/21/2021 8:00 AM Medical Record Number: [id number] Patient Account Number: [id number] Date of Birth/Sex:  19-Aug-1955 (65 y.o. M) Treating RN: [name] Primary Care Akeel Reffner: [name] Other Clinician: Referring Cabrini Ruggieri: [name] Treating Juanmiguel Defelice/Extender: Skipper Cliche in Treatment: 12 Active Inactive Wound/Skin Impairment Nursing Diagnoses: Impaired tissue integrity Knowledge deficit related to smoking impact on wound healing Knowledge deficit related to ulceration/compromised skin integrity Goals: Patient/caregiver will verbalize understanding of skin care regimen Date Initiated: 05/29/2021 Date Inactivated: 07/24/2021 Target Resolution Date: 06/14/2021 Goal Status: Met Ulcer/skin breakdown will have a volume reduction of 30% by week 4 Date Initiated: 05/29/2021 Target Resolution Date: 06/28/2021 Goal Status: Active Ulcer/skin breakdown will have a volume reduction of 50% by week 8 Date Initiated: 05/29/2021 Target Resolution Date: 07/29/2021 Goal Status: Active Ulcer/skin breakdown will have a volume reduction of 80% by week 12 Date Initiated: 05/29/2021 Target Resolution Date: 08/28/2021 Goal Status: Active Interventions: Assess patient/caregiver ability to obtain necessary supplies Assess patient/caregiver ability to perform ulcer/skin care regimen upon admission and as needed Assess ulceration(s) every visit Notes: Electronic Signature(s) Signed: 08/21/2021 9:24:04 AM By: [name] RN Entered By: [name] on 08/21/2021 09:24:04 Steier, [name] ([id number]) -------------------------------------------------------------------------------- Pain Assessment Details Patient Name: [name] Date of Service: 08/21/2021 8:00 AM Medical Record Number: [id number] Patient Account Number: [id number] Date of Birth/Sex: 08-26-1955 (65 y.o. M) Treating RN: [name] Primary Care Farah Lepak: [name] Other Clinician: Referring Kaori Jumper: [name] Treating Mareo Portilla/Extender: Skipper Cliche in Treatment: 12 Active  Problems Location of Pain Severity and Description of Pain Patient Has Paino No Site Locations Pain Management and Medication Current Pain Management: Electronic Signature(s) Signed: 08/22/2021 3:39:51 PM By: [name] RN Entered By: [name] on 08/21/2021 08:18:55 [name], [name] ([id number]) -------------------------------------------------------------------------------- Patient/Caregiver Education Details Patient Name: [name] Date of Service: 08/21/2021 8:00 AM Medical Record Number: [id number] Patient Account Number: [id number] Date of Birth/Gender: 06-15-1956 (65 y.o. M) Treating RN: [name] Primary Care Physician: [name] Other Clinician: Referring Physician: [name] Treating Physician/Extender: Skipper Cliche in Treatment: 12 Education Assessment Education Provided To: Patient Education Topics Provided Wound/Skin Impairment: Methods: Explain/Verbal Responses: State content correctly Electronic Signature(s) Signed: 08/22/2021 3:39:51 PM By: [name] RN Entered By: [name] on 08/21/2021 08:37:43 [name], [name] ([id number]) -------------------------------------------------------------------------------- Wound Assessment Details Patient Name: [name] Date of Service: 08/21/2021 8:00 AM Medical Record Number: [id number] Patient Account Number: [id number] Date of Birth/Sex: 02-Jun-1956 (65 y.o. M) Treating RN: [name] Primary Care Liset Mcmonigle: [name] Other Clinician: Referring Zanovia Rotz: [name] Treating Jolyne Laye/Extender: Skipper Cliche in Treatment: 12 Wound Status Wound Number: 1 Primary Pressure Ulcer Etiology: Wound Location: Right Calcaneus Secondary Diabetic Wound/Ulcer of the Lower Extremity Wounding Event: Gradually Appeared Etiology: Date Acquired: 02/16/2021 Wound Open Weeks Of Treatment: 12 Status: Clustered Wound: No Comorbid Anemia, Sleep Apnea,  Coronary Artery Disease, Deep History: Vein Thrombosis, Hypertension, Type II Diabetes, History of pressure wounds, Neuropathy Photos Wound Measurements Length: (cm) 1.3 Width: (cm) 2 Depth: (cm) 0.1 Area: (cm) 2.042 Volume: (cm)  0.204 % Reduction in Area: -44.4% % Reduction in Volume: -44.7% Epithelialization: None Tunneling: No Undermining: No Wound Description Classification: Category/Stage III Exudate Amount: Medium Exudate Type: Serosanguineous Exudate Color: red, brown Foul Odor After Cleansing: No Slough/Fibrino Yes Wound Bed Granulation Amount: Small (1-33%) Exposed Structure Granulation Quality: Red, [name] Fascia Exposed: No Necrotic Amount: Large (67-100%) Fat Layer (Subcutaneous Tissue) Exposed: Yes Necrotic Quality: Eschar, Adherent Slough Tendon Exposed: No Muscle Exposed: No Joint Exposed: No Bone Exposed: No Treatment Notes Wound #1 (Calcaneus) Wound Laterality: Right Cleanser Byram Ancillary Kit - 15 Day Supply Discharge Instruction: Use supplies as instructed; Kit contains: (15) Saline Bullets; (15) 3x3 Gauze; 15 pr Gloves [name], [name] ([id number]) Normal Saline Discharge Instruction: Wash your hands with soap and water. Remove old dressing, discard into plastic bag and place into trash. Cleanse the wound with Normal Saline prior to applying a clean dressing using gauze sponges, not tissues or cotton balls. Do not scrub or use excessive force. Pat dry using gauze sponges, not tissue or cotton balls. Soap and Water Discharge Instruction: Gently cleanse wound with antibacterial soap, rinse and pat dry prior to dressing wounds Peri-Wound Care Topical Primary Dressing Silvercel Small 2x2 (in/in) Discharge Instruction: Apply Silvercel Small 2x2 (in/in) as instructed Secondary Dressing Mepilex Border Flex, 4x4 (in/in) Discharge Instruction: Apply to wound as directed. Do not cut. Secured With Compression Wrap Compression  Stockings Environmental education officer) Signed: 08/21/2021 8:24:41 AM By: [name] RN Entered By: [name] on 08/21/2021 08:24:41 [name], [name] ([id number]) -------------------------------------------------------------------------------- Wound Assessment Details Patient Name: [name] Date of Service: 08/21/2021 8:00 AM Medical Record Number: [id number] Patient Account Number: [id number] Date of Birth/Sex: Dec 31, 1955 (65 y.o. M) Treating RN: [name] Primary Care Keondra Haydu: [name] Other Clinician: Referring Donovon Micheletti: [name] Treating Katye Valek/Extender: Skipper Cliche in Treatment: 12 Wound Status Wound Number: 2 Primary Pressure Ulcer Etiology: Wound Location: Sacrum Wound Open Wounding Event: Gradually Appeared Status: Date Acquired: 07/21/2018 Comorbid Anemia, Sleep Apnea, Coronary Artery Disease, Deep Vein Weeks Of Treatment: 12 History: Thrombosis, Hypertension, Type II Diabetes, History of Clustered Wound: No pressure wounds, Neuropathy Wound Measurements Length: (cm) 3.5 Width: (cm) 2 Depth: (cm) 5.1 Area: (cm) 5.498 Volume: (cm) 28.039 % Reduction in Area: 83.5% % Reduction in Volume: 76.7% Epithelialization: None Tunneling: No Undermining: No Wound Description Classification: Category/Stage IV Exudate Amount: Large Exudate Type: Serosanguineous Exudate Color: red, brown Foul Odor After Cleansing: No Slough/Fibrino Yes Wound Bed Granulation Amount: Large (67-100%) Exposed Structure Granulation Quality: Red, [name] Fascia Exposed: No Necrotic Amount: Small (1-33%) Fat Layer (Subcutaneous Tissue) Exposed: Yes Necrotic Quality: Adherent Slough Tendon Exposed: No Muscle Exposed: Yes Necrosis of Muscle: No Joint Exposed: No Bone Exposed: No Treatment Notes Wound #2 (Sacrum) Cleanser Byram Ancillary Kit - 15 Day Supply Discharge Instruction: Use supplies as instructed; Kit contains: (15) Saline  Bullets; (15) 3x3 Gauze; 15 pr Gloves Dakin 16 (oz) 0.25 Discharge Instruction: Use as directed. Normal Saline Discharge Instruction: Wash your hands with soap and water. Remove old dressing, discard into plastic bag and place into trash. Cleanse the wound with Normal Saline prior to applying a clean dressing using gauze sponges, not tissues or cotton balls. Do not scrub or use excessive force. Pat dry using gauze sponges, not tissue or cotton balls. Soap and Water Discharge Instruction: Gently cleanse wound with antibacterial soap, rinse and pat dry prior to dressing wounds Peri-Wound Care Topical Primary Dressing Gauze [name], [name] ([id number]) Discharge Instruction: Dakins gauze-As directed: dry, moistened with saline or moistened with Dakins Solution  Secondary Dressing ABD Pad 5x9 (in/in) Discharge Instruction: Cover with ABD pad Secured With 3M Medipore H Soft Cloth Surgical Tape, 2x2 (in/yd) Compression Wrap Compression Stockings Add-Ons Electronic Signature(s) Signed: 08/21/2021 8:25:16 AM By: [name] RN Entered By: [name] on 08/21/2021 08:25:15 [name], [name] ([id number]) -------------------------------------------------------------------------------- Wound Assessment Details Patient Name: [name] Date of Service: 08/21/2021 8:00 AM Medical Record Number: [id number] Patient Account Number: [id number] Date of Birth/Sex: 07-Dec-1955 (65 y.o. M) Treating RN: [name] Primary Care Shanan Fitzpatrick: [name] Other Clinician: Referring Latayna Ritchie: [name] Treating Kamdon Reisig/Extender: Skipper Cliche in Treatment: 12 Wound Status Wound Number: 3 Primary Pressure Ulcer Etiology: Wound Location: Posterior Upper Leg Wound Open Wounding Event: Gradually Appeared Status: Date Acquired: 08/14/2021 Comorbid Anemia, Sleep Apnea, Coronary Artery Disease, Deep Vein Weeks Of Treatment: 0 History: Thrombosis, Hypertension, Type II  Diabetes, History of Clustered Wound: No pressure wounds, Neuropathy Photos Wound Measurements Length: (cm) 2 Width: (cm) 2 Depth: (cm) 0.1 Area: (cm) 3.142 Volume: (cm) 0.314 % Reduction in Area: 0% % Reduction in Volume: 0% Epithelialization: None Tunneling: No Undermining: No Wound Description Classification: Category/Stage II Exudate Amount: Medium Exudate Type: Serosanguineous Exudate Color: red, brown Foul Odor After Cleansing: No Slough/Fibrino Yes Wound Bed Granulation Amount: Medium (34-66%) Exposed Structure Granulation Quality: Red, [name] Fascia Exposed: No Necrotic Amount: None Present (0%) Fat Layer (Subcutaneous Tissue) Exposed: Yes Tendon Exposed: No Muscle Exposed: No Joint Exposed: No Bone Exposed: No Treatment Notes Wound #3 (Upper Leg) Wound Laterality: Posterior Cleanser Peri-Wound Care AandD Ointment Discharge Instruction: Apply AandD Ointment as directed MERLAND, HOLNESS ([id number]) Topical Primary Dressing Secondary Dressing Secured With Compression Wrap Compression Stockings Add-Ons Electronic Signature(s) Signed: 08/21/2021 8:25:44 AM By: [name] RN Entered By: [name] on 08/21/2021 08:25:44 [name], [name] ([id number]) -------------------------------------------------------------------------------- Vitals Details Patient Name: [name] Date of Service: 08/21/2021 8:00 AM Medical Record Number: [id number] Patient Account Number: [id number] Date of Birth/Sex: April 30, 1956 (65 y.o. M) Treating RN: [name] Primary Care Glenette Bookwalter: [name] Other Clinician: Referring Consuello Lassalle: [name] Treating Tiajuana Leppanen/Extender: Skipper Cliche in Treatment: 12 Vital Signs Time Taken: 08:18 Temperature (F): 97.7 Height (in): 66 Pulse (bpm): 84 Respiratory Rate (breaths/min): 18 Blood Pressure (mmHg): 166/83 Reference Range: 80 - 120 mg / dl Electronic Signature(s) Signed: 08/22/2021  3:39:51 PM By: [name] RN Entered By: [name] on 08/21/2021 08:18:38

## 2021-08-30 ENCOUNTER — Other Ambulatory Visit: Payer: Self-pay

## 2021-08-30 ENCOUNTER — Ambulatory Visit (INDEPENDENT_AMBULATORY_CARE_PROVIDER_SITE_OTHER): Payer: Medicare Other | Admitting: Psychiatry

## 2021-08-30 ENCOUNTER — Encounter: Payer: Self-pay | Admitting: Psychiatry

## 2021-08-30 VITALS — BP 146/87 | HR 87 | Temp 97.9°F

## 2021-08-30 DIAGNOSIS — G4701 Insomnia due to medical condition: Secondary | ICD-10-CM | POA: Diagnosis not present

## 2021-08-30 DIAGNOSIS — Z79899 Other long term (current) drug therapy: Secondary | ICD-10-CM

## 2021-08-30 DIAGNOSIS — F411 Generalized anxiety disorder: Secondary | ICD-10-CM

## 2021-08-30 DIAGNOSIS — F0390 Unspecified dementia without behavioral disturbance: Secondary | ICD-10-CM | POA: Diagnosis not present

## 2021-08-30 DIAGNOSIS — F3178 Bipolar disorder, in full remission, most recent episode mixed: Secondary | ICD-10-CM | POA: Diagnosis not present

## 2021-08-30 MED ORDER — CLONAZEPAM 0.5 MG PO TABS: 0.2500 mg | ORAL_TABLET | ORAL | 0 refills | Status: DC

## 2021-08-30 MED ORDER — QUETIAPINE FUMARATE 50 MG PO TABS: 25.0000 mg | ORAL_TABLET | Freq: Every day | ORAL | 0 refills | Status: DC | PRN

## 2021-08-30 MED ORDER — OXCARBAZEPINE 300 MG PO TABS: 300.0000 mg | ORAL_TABLET | Freq: Two times a day (BID) | ORAL | 0 refills | Status: DC

## 2021-08-30 MED ORDER — QUETIAPINE FUMARATE 100 MG PO TABS: 100.0000 mg | ORAL_TABLET | Freq: Every day | ORAL | 0 refills | Status: DC

## 2021-08-30 NOTE — Progress Notes (Signed)
BH MD OP Progress Note  08/30/2021 5:56 PM [name]  MRN:  [id number]  Chief Complaint:  Chief Complaint  Patient presents with   Follow-up: 66 year old African-American male, with history of bipolar disorder, GAD, insomnia, major neurocognitive disorder, multiple medical problems including recent medical admission for sepsis due to cellulitis, presented for medication management.   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  Patient being a limited historian, collateral information was obtained from wife.  Patient with recent inpatient medical admission for cellulitis, sepsis-dated 07/27/2021.  Reviewed notes per Dr. Allena Katz -patient with sepsis secondary to UTI suspect due to chronic Foley catheter, patient with chronic sacral decubitus with chronic osteomyelitis, incidental COVID-19 infection and chronic kidney disease stage III, history of DVT on Eliquis.  Patient also had cellulitis a month prior to this one ,in December 2022 as well , requiring admission.  As per wife patient is currently extremely anxious, regressive behavior, clingy, needs wife with him all the time.  Patient was doing fairly well prior to his inpatient medical admission however started decompensating in the past few weeks.  Patient ran out of his Seroquel recently which which made his mood symptoms worse.  Patient appeared to be alert, oriented to person place time situation.  Patient responded to questions in short phrases.  Reports he is anxious otherwise could not elaborate further.  Denies any suicidality.  Did not appear to be preoccupied with any delusions or paranoia.  Denies any perceptual disturbances.    Visit Diagnosis:    ICD-10-CM   1. Bipolar disorder, in full remission, most recent episode mixed (HCC)  F31.78 QUEtiapine  (SEROQUEL) 100 MG tablet    QUEtiapine (SEROQUEL) 50 MG tablet    Sodium    Oxcarbazepine (TRILEPTAL) 300 MG tablet    2. GAD (generalized anxiety disorder)  F41.1 QUEtiapine (SEROQUEL) 50 MG tablet    clonazePAM (KLONOPIN) 0.5 MG tablet    3. Insomnia due to medical condition  G47.01 QUEtiapine (SEROQUEL) 50 MG tablet   pain, recent UTI, cellulitis    4. 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 High risk medication use  Z79.899 Sodium    clonazePAM (KLONOPIN) 0.5 MG tablet      Past Psychiatric History: Reviewed past psychiatric history from progress note on 09/09/2017.  Past Medical History:  Past Medical History:  Diagnosis Date   Bipolar disorder (HCC)    Diabetes mellitus without complication (HCC)    History of blood clots    Hypertension    TBI (traumatic brain injury)     Past Surgical History:  Procedure Laterality Date   BACK SURGERY     CARPAL TUNNEL RELEASE Bilateral    COLON SURGERY     COLOSTOMY     TONSILLECTOMY      Family Psychiatric History: Reviewed family psychiatric history from progress note on 09/09/2017.  Family History:  Family History  Problem Relation Age of Onset   Depression Father    Deep vein thrombosis Father     Social History: Reviewed social history from progress note on 09/09/2017. Social History   Socioeconomic History   Marital status: Married    Spouse name: [name]   Number of children: 0   Years of education: Not on file   Highest education level: Associate degree: occupational, Scientist, product/process development, or vocational program  Occupational History   Not on file  Tobacco Use   Smoking status: Never    Passive exposure: Never   Smokeless tobacco: Never  Vaping Use   Vaping Use: Never used  Substance and Sexual Activity   Alcohol use: Yes   Drug use: No   Sexual activity: Not on file  Other Topics Concern   Not on file  Social History Narrative   Not on file   Social  Determinants of Health   Financial Resource Strain: Not on file  Food Insecurity: Not on file  Transportation Needs: Not on file  Physical Activity: Not on file  Stress: Not on file  Social Connections: Not on file    Allergies: No Known Allergies  Metabolic Disorder Labs: Lab Results  Component Value Date   HGBA1C 7.3 (H) 06/22/2021   MPG 163 06/22/2021   MPG 151.33 02/24/2021   No results found for: PROLACTIN Lab Results  Component Value Date   CHOL 141 04/14/2021   TRIG 45 04/14/2021   HDL 44 04/14/2021   CHOLHDL 3.2 04/14/2021   VLDL 9 04/14/2021   LDLCALC 88 04/14/2021   Lab Results  Component Value Date   TSH 2.462 07/29/2021   TSH 2.175 04/14/2021    Therapeutic Level Labs: Lab Results  Component Value Date   LITHIUM 1.24 (H) 01/04/2016   LITHIUM 1.51 (HH) 01/03/2016   No results found for: VALPROATE No components found for:  CBMZ  Current Medications: Current Outpatient Medications  Medication Sig Dispense Refill   amLODipine (NORVASC) 5 MG tablet Take 5 mg by mouth daily.     ascorbic acid (VITAMIN C) 500 MG tablet Take 1 tablet (500 mg total) by mouth daily. 30 tablet 0   cholecalciferol (VITAMIN D) 25 MCG tablet Take 1 tablet (1,000 Units total) by mouth daily. 30 tablet 0   clonazePAM (KLONOPIN) 0.5 MG tablet Take 0.5-1 tablets (0.25-0.5 mg total) by mouth as directed. Take half to 1 tablet 2-3 times a week only for severe anxiety, agitation 12 tablet 0   ELIQUIS 5 MG TABS tablet Take 5 mg by mouth 2 (two) times daily.      glipiZIDE (GLUCOTROL XL) 2.5 MG 24 hr tablet Take 1 tablet (2.5 mg total) by mouth daily with breakfast. 30 tablet 0   memantine (NAMENDA) 10 MG tablet Take 10 mg by mouth 2 (two) times daily.     Omega-3 1000 MG CAPS Take by mouth.     rosuvastatin (CRESTOR) 20 MG tablet Take 20 mg by mouth daily.      vitamin B-12 (CYANOCOBALAMIN) 1000 MCG tablet Take 3,000 mcg by mouth daily.     zinc sulfate 220 (50 Zn) MG capsule Take 1  capsule (220 mg total) by mouth daily. 30 capsule 0   Oxcarbazepine (TRILEPTAL) 300 MG tablet Take 1 tablet (300 mg total) by mouth 2 (two) times daily. 180 tablet 0   QUEtiapine (SEROQUEL) 100 MG tablet Take 1 tablet (100 mg total) by mouth at bedtime. 90 tablet 0   QUEtiapine (SEROQUEL) 50 MG tablet Take 0.5-1 tablets (25-50 mg total) by mouth daily as needed (agitation). 90 tablet 0   No current facility-administered medications for this visit.     Musculoskeletal: Strength & Muscle Tone:  UTA Gait & Station:  Wheelchair bound Patient leans: Backward  Psychiatric Specialty Exam: Review of Systems  Unable to perform ROS: Dementia  All other systems reviewed and are negative.  Blood pressure (!) 146/87, pulse 87, temperature 97.9  F (36.6 C), temperature source Temporal.There is no height or weight on file to calculate BMI.  General Appearance: Casual  Eye Contact:  Fair  Speech:  Normal Rate  Volume:  Decreased  Mood:  Anxious  Affect:  Congruent  Thought Process:  Goal Directed and Descriptions of Associations: Intact  Orientation:  Full (Time, Place, and Person)  Thought Content: Logical   Suicidal Thoughts:  No  Homicidal Thoughts:  No  Memory:  Immediate;   Fair Recent;   limited Remote;   limited  Judgement:  Impaired  Insight:  Shallow  Psychomotor Activity:  Decreased  Concentration:  Concentration: Fair and Attention Span: Fair  Recall:  [employer] of Knowledge: Fair  Language: Fair  Akathisia:  No  Handed:  Right  AIMS (if indicated): done, 0  Assets:  Desire for Improvement Housing Intimacy Social Support Transportation  ADL's:  Intact  Cognition: Impaired,  Mild  Sleep:  Fair   Screenings: AIMS    Flowsheet Row Office Visit from 03/10/2018 in [facility]  AIMS Total Score 0      PHQ2-9    Flowsheet Row Office Visit from 08/30/2021 in [facility] Video Visit from 12/05/2020 in [facility] Office Visit from 10/17/2020 in [facility]  PHQ-2 Total Score 3 1 0  PHQ-9 Total Score 6 2 --      Flowsheet Row Office Visit from 08/30/2021 in [facility] ED to Hosp-Admission (Discharged) from 07/27/2021 in [facility] ED to Hosp-Admission (Discharged) from 06/22/2021 in [facility]  C-SSRS RISK CATEGORY No Risk No Risk No Risk        Assessment and Plan: [name] is a 66 year old African-American male who has a history of bipolar disorder, right heel ulcer, 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  Patient is currently struggling with anxiety, will benefit from the following plan.  Plan  Bipolar disorder in remission Trileptal 300 mg p.o. twice daily Seroquel 100 mg p.o. nightly Seroquel 50 mg as needed for severe anxiety or agitation  Insomnia-stable Seroquel 100 mg p.o. nightly  GAD-unstable Referral for CBT. Restart Seroquel 100 mg p.o. nightly and 50 mg as noted above Start Klonopin 0.25-0.5 mg as needed 2-3 times a week only for severe agitation panic symptoms.   Major neurocognitive disorder-chronic-unstable Patient to continue to follow-up with neurology Will provide information for [facility].  I have also faxed a referral.  Collateral information obtained from wife as well as review of medical records from most recent inpatient medical admission-Dr. Allena Katz as noted above.  Follow-up in clinic in 2 to 3 weeks or sooner if needed.   Collaboration of Care: Collaboration of Care: Referral or follow-up with counselor/therapist AEB communicated with staff to  schedule this patient with therapist.  Patient/Guardian was advised Release of Information must be obtained prior to any record release in order to collaborate their care with an outside provider. Patient/Guardian was advised if they have not already done so to contact the registration department to sign all necessary forms in order for [place] to release information regarding their care.   Consent: Patient/Guardian gives verbal consent for treatment and assignment of benefits for services provided during this visit. Patient/Guardian expressed understanding and agreed to proceed.  This note was generated in part or whole with voice recognition software. Voice recognition is usually quite accurate but there are transcription errors that can and very often do occur. I apologize for any typographical errors that were not detected and corrected.      [name], MD 08/31/2021, 8:09 AM

## 2021-08-30 NOTE — Patient Instructions (Signed)
Clonazepam Tablets What is this medication? CLONAZEPAM (kloe NA ze pam) treats seizures. It may also be used to treat panic disorder. It works by helping your nervous system calm down. It belongs to a group of medications called benzodiazepines. This medicine may be used for other purposes; ask your health care provider or pharmacist if you have questions. COMMON BRAND NAME(S): Ceberclon, Klonopin What should I tell my care team before I take this medication? They need to know if you have any of these conditions: An alcohol or drug abuse problem Bipolar disorder, depression, psychosis or other mental health condition Glaucoma Kidney or liver disease Lung or breathing disease Myasthenia gravis Parkinson disease Porphyria Seizures or a history of seizures Suicidal thoughts An unusual or allergic reaction to clonazepam, other benzodiazepines, foods, dyes, or preservatives Pregnant or trying to get pregnant Breast-feeding How should I use this medication? Take this medication by mouth with a glass of water. Follow the directions on the prescription label. If it upsets your stomach, take it with food or milk. Take your medication at regular intervals. Do not take it more often than directed. Do not stop taking or change the dose except on the advice of your care team. A special MedGuide will be given to you by the pharmacist with each prescription and refill. Be sure to read this information carefully each time. Talk to your care team regarding the use of this medication in children. Special care may be needed. Overdosage: If you think you have taken too much of this medicine contact a poison control center or emergency room at once. NOTE: This medicine is only for you. Do not share this medicine with others. What if I miss a dose? If you miss a dose, take it as soon as you can. If it is almost time for your next dose, take only that dose. Do not take double or extra doses. What may interact  with this medication? Do not take this medication with any of the following: Narcotic medications for cough Sodium oxybate This medication may also interact with the following: Alcohol Antihistamines for allergy, cough and cold Antiviral medications for HIV or AIDS Certain medications for anxiety or sleep Certain medications for depression, like amitriptyline, fluoxetine, sertraline Certain medications for fungal infections like ketoconazole and itraconazole Certain medications for seizures like carbamazepine, phenobarbital, phenytoin, primidone General anesthetics like halothane, isoflurane, methoxyflurane, propofol Local anesthetics like lidocaine, pramoxine, tetracaine Medications that relax muscles for surgery Narcotic medications for pain Phenothiazines like chlorpromazine, mesoridazine, prochlorperazine, thioridazine This list may not describe all possible interactions. Give your health care provider a list of all the medicines, herbs, non-prescription drugs, or dietary supplements you use. Also tell them if you smoke, drink alcohol, or use illegal drugs. Some items may interact with your medicine. What should I watch for while using this medication? Tell your care team if your symptoms do not start to get better or if they get worse. Do not stop taking except on your care team's advice. You may develop a severe reaction. Your care team will tell you how much medication to take. You may get drowsy or dizzy. Do not drive, use machinery, or do anything that needs mental alertness until you know how this medication affects you. To reduce the risk of dizzy and fainting spells, do not stand or sit up quickly, especially if you are an older patient. Alcohol may increase dizziness and drowsiness. Avoid alcoholic drinks. If you are taking another medication that also causes drowsiness, you may   have more side effects. Give your care team a list of all medications you use. Your care team will tell  you how much medication to take. Do not take more medication than directed. Call emergency services if you have problems breathing or unusual sleepiness. The use of this medication may increase the chance of suicidal thoughts or actions. Pay special attention to how you are responding while on this medication. Any worsening of mood, or thoughts of suicide or dying should be reported to your care team right away. What side effects may I notice from receiving this medication? Side effects that you should report to your care team as soon as possible: Allergic reactions--skin rash, itching, hives, swelling of the face, lips, tongue, or throat CNS depression--slow or shallow breathing, shortness of breath, feeling faint, dizziness, confusion, trouble staying awake Thoughts of suicide or self-harm, worsening mood, feelings of depression Side effects that usually do not require medical attention (report to your care team if they continue or are bothersome): Dizziness Drowsiness Headache This list may not describe all possible side effects. Call your doctor for medical advice about side effects. You may report side effects to FDA at 1-800-FDA-1088. Where should I keep my medication? Keep out of the reach of children and pets. This medication can be abused. Keep your medication in a safe place to protect it from theft. Do not share this medication with anyone. Selling or giving away this medication is dangerous and against the law. Store at room temperature between 15 and 30 degrees C (59 and 86 degrees F). Protect from light. Keep container tightly closed. This medication may cause accidental overdose and death if taken by other adults, children, or pets. Mix any unused medication with a substance like cat litter or coffee grounds. Then throw the medication away in a sealed container like a sealed bag or a coffee can with a lid. Do not use the medication after the expiration date. NOTE: This sheet is a  summary. It may not cover all possible information. If you have questions about this medicine, talk to your doctor, pharmacist, or health care provider.  2022 Elsevier/Gold Standard (2020-07-14 00:00:00)  

## 2021-09-04 ENCOUNTER — Other Ambulatory Visit: Payer: Self-pay

## 2021-09-04 ENCOUNTER — Encounter: Payer: Medicare Other | Attending: Physician Assistant | Admitting: Physician Assistant

## 2021-09-04 DIAGNOSIS — M6281 Muscle weakness (generalized): Secondary | ICD-10-CM | POA: Insufficient documentation

## 2021-09-04 DIAGNOSIS — E11622 Type 2 diabetes mellitus with other skin ulcer: Secondary | ICD-10-CM | POA: Insufficient documentation

## 2021-09-04 DIAGNOSIS — Z7901 Long term (current) use of anticoagulants: Secondary | ICD-10-CM | POA: Diagnosis not present

## 2021-09-04 DIAGNOSIS — F319 Bipolar disorder, unspecified: Secondary | ICD-10-CM | POA: Diagnosis not present

## 2021-09-04 DIAGNOSIS — L89892 Pressure ulcer of other site, stage 2: Secondary | ICD-10-CM | POA: Insufficient documentation

## 2021-09-04 DIAGNOSIS — L89613 Pressure ulcer of right heel, stage 3: Secondary | ICD-10-CM | POA: Insufficient documentation

## 2021-09-04 DIAGNOSIS — E11621 Type 2 diabetes mellitus with foot ulcer: Secondary | ICD-10-CM | POA: Diagnosis not present

## 2021-09-04 DIAGNOSIS — L89623 Pressure ulcer of left heel, stage 3: Secondary | ICD-10-CM | POA: Insufficient documentation

## 2021-09-04 DIAGNOSIS — I10 Essential (primary) hypertension: Secondary | ICD-10-CM | POA: Insufficient documentation

## 2021-09-04 DIAGNOSIS — L89154 Pressure ulcer of sacral region, stage 4: Secondary | ICD-10-CM | POA: Diagnosis not present

## 2021-09-04 DIAGNOSIS — Z87820 Personal history of traumatic brain injury: Secondary | ICD-10-CM | POA: Diagnosis not present

## 2021-09-04 NOTE — Progress Notes (Addendum)
GAELEN, BRAGER ([id number]) Visit Report for 09/04/2021 Chief Complaint Document Details Patient Name: [name], [name] Date of Service: 09/04/2021 8:30 AM Medical Record Number: [id number] Patient Account Number: [id number] Date of Birth/Sex: 02/12/1956 (65 y.o. M) Treating RN: Donnamarie Poag Primary Care Provider: [name] Other Clinician: Referring Provider: [name] Treating Provider/Extender: Skipper Cliche in Treatment: 14 Information Obtained from: Patient Chief Complaint Sacral, right gluteal, and right heel ulcers Electronic Signature(s) Signed: 09/04/2021 8:59:40 AM By: [name] PA-C Entered By: [name] on 09/04/2021 08:59:40 Bogdon, [name] ([id number]) -------------------------------------------------------------------------------- HPI Details Patient Name: [name] Date of Service: 09/04/2021 8:30 AM Medical Record Number: [id number] Patient Account Number: [id number] Date of Birth/Sex: [birth year] (65 y.o. M) Treating RN: Donnamarie Poag Primary Care Provider: [name] Other Clinician: Referring Provider: [name] Treating Provider/Extender: Skipper Cliche in Treatment: 14 History of Present Illness HPI Description: 05/29/2021 this is a patient who presents today for initial inspection here in the clinic concerning wounds that he has over the right heel and the sacral region. Unfortunately the sacral wound is starting to spread off to the right gluteal location due to how he sits always leaning towards the right side in his chair. His wife is present she is the primary caregiver though she is not home with him all the time she does have to work. She does do an excellent job however it appears to be in trying to keep things under good control for him. The patient is not able to change positions himself nor walk by himself so he is pretty much at the mercy of the position he is putting when she is gone  and this tends to be his chair which she sits and most of the day. Obviously this I think is the main culprit for what is going on currently. It was actually in January 2020 when the sacral wound started. It was in September 2022 when the wound started to spread more to the right gluteal location. Subsequently in August 2022 is when he had been in a skilled nursing facility and the heel started to give him trouble as well. That does not seem to be doing nearly as poorly as the sacral region. He was hospitalized in October 2022 secondary to sepsis and this was in regard to the foot and was sent to skilled nursing again he is now back at home. He did have a wound VAC for the sacral wound over the summer 2022 but being in and out of facilities this ended up getting sent back. The patient does have [facility] home health that comes out 1 time per week to help with care. His most recent hemoglobin A1c was 6.9 in August 2022. Patient's met past medical history includes generalized muscle weakness, bipolar disorder, diabetes mellitus type 2, hypertension, long-term use of anticoagulant therapy due to frequent blood clots/DVTs. He also has a history of traumatic brain injury. 07/24/2021 upon evaluation today patient appears to be doing decently well in regard to the pressure ulcer on the right heel as well as the sacral region. In general I think you are making some progress here which is great news. Overall the heel unfortunately had already closed previously when we saw him although it apparently reopened when he was working with physical therapy according to his wife. The area in the sacral region is doing well and looks clean there is still some depth here but I still think it would be difficult to wound VAC this region. His wife  does an awesome job taking care of him. Is been so long since we have seen him because he has been in the hospital to be honest. 08/07/2021 upon evaluation today patient appears to be  doing well at this time. Fortunately I do not see any signs of active infection locally or systemically at this time which is great news. No fevers, chills, nausea, vomiting, or diarrhea. Unfortunately after I saw him on the 24th he actually ended up in the hospital in the 27th due to being septic. This was not due to the wounds but after looking at records actually due to a UTI. Fortunately he is doing much better and very happy in that regard. I do not see any signs of infection locally nor systemically at this time. 08/21/2021 upon evaluation today patient appears to be doing well with regard to his wound. Fortunately I think that the sacral region is doing decently well at this point which is great news. With regard to the foot this also does have some slough noted but I feel like we are making progress here. He does have some irritation around the right upper thigh/gluteal region. I feel like it is more towards the thigh. Nonetheless this does appear to be pressure related he spends a lot of time sitting up his caregiver states he really will not get in the bed and stay there like he should. 09/04/2021 upon evaluation patient appears to be doing decently well today in regard to the wound on his heel as well as the sacral area. I am actually very pleased with both and how things are appearing currently. There does not appear to be any evidence of active infection I think that his caregiver is doing an awesome job with regard to the wound care here. She is present today as well and I did discuss this with her. Electronic Signature(s) Signed: 09/04/2021 7:17:48 PM By: [name] PA-C Entered By: [name] on 09/04/2021 19:17:48 Durnil, [name] ([id number]) -------------------------------------------------------------------------------- Physical Exam Details Patient Name: [name] Date of Service: 09/04/2021 8:30 AM Medical Record Number: [id number] Patient Account  Number: [id number] Date of Birth/Sex: 10/12/1955 (65 y.o. M) Treating RN: Donnamarie Poag Primary Care Provider: [name] Other Clinician: Referring Provider: [name] Treating Provider/Extender: Skipper Cliche in Treatment: 64 Constitutional Well-nourished and well-hydrated in no acute distress. Respiratory normal breathing without difficulty. Psychiatric this patient is able to make decisions and demonstrates good insight into disease process. Alert and Oriented x 3. pleasant and cooperative. Notes Upon inspection patient's wound bed actually showed signs of good granulation and epithelization at this point. Fortunately I do not see any evidence of active infection locally or systemically which is great news and overall I think that we are headed in the correct direction. Electronic Signature(s) Signed: 09/04/2021 7:18:08 PM By: [name] PA-C Entered By: [name] on 09/04/2021 19:18:08 Talent, [name] ([id number]) -------------------------------------------------------------------------------- Physician Orders Details Patient Name: [name] Date of Service: 09/04/2021 8:30 AM Medical Record Number: [id number] Patient Account Number: [id number] Date of Birth/Sex: 17-May-1956 (65 y.o. M) Treating RN: Donnamarie Poag Primary Care Provider: [name] Other Clinician: Referring Provider: [name] Treating Provider/Extender: Skipper Cliche in Treatment: 14 Verbal / Phone Orders: No Diagnosis Coding ICD-10 Coding Code Description [phone] Pressure ulcer of right heel, stage 3 L89.154 Pressure ulcer of sacral region, stage 4 L89.892 Pressure ulcer of other site, stage 2 M62.81 Muscle weakness (generalized) F31.9 Bipolar disorder, unspecified E11.622 Type 2 diabetes mellitus with other  skin ulcer I10 Essential (primary) hypertension Z79.01 Long term (current) use of anticoagulants Z87.820 Personal history of traumatic brain  injury Follow-up Appointments o Return Appointment in 2 weeks. o Nurse Visit as needed [place] for wound care. May utilize formulary equivalent dressing for wound treatment orders unless otherwise specified. Home Health Nurse may visit [facility] to address patientos wound care needs. - for sacrum and right heel o Scheduled days for dressing changes to be completed; exception, patient has scheduled wound care visit that day. o **Please direct any NON-WOUND related issues/requests for orders to patient's Primary Care Physician. **If current dressing causes regression in wound condition, may D/C ordered dressing product/s and apply Normal Saline Moist Dressing daily until next [place] or Other MD appointment. **Notify [facility] of regression in wound condition at [phone]. Bathing/ Shower/ Hygiene o May shower with wound dressing protected with water repellent cover or cast protector. o No tub bath. Anesthetic (Use 'Patient Medications' Section for Anesthetic Order Entry) o Lidocaine applied to wound bed Edema Control - Lymphedema / Segmental Compressive Device / Other o Elevate leg(s) parallel to the floor when sitting. o DO YOUR BEST to sleep in the bed at night. DO NOT sleep in your recliner. Long hours of sitting in a recliner leads to swelling of the legs and/or potential wounds on your backside. Non-Wound Condition o Additional non-wound orders/instructions: - AandD to excoriated areas of gluteus for protection where adhesive isn't needed to promote healing Off-Loading o Gel wheelchair cushion o Low air-loss mattress (Group 2) o Turn and reposition every 2 hours - keep pressure off of the sacrum and right heel wounds o Other: - PRAFO boot in bed keep pressure off of sacrum/gluteus and right heel- Additional Orders / Instructions o Follow Nutritious Diet and Increase  Protein Intake Wound Treatment Wound #1 - Calcaneus Wound Laterality: Right Yard, Cabe ([id number]) Cleanser: Byram Ancillary Kit - 15 Day Supply 1 x Per Day/15 Days Discharge Instructions: Use supplies as instructed; Kit contains: (15) Saline Bullets; (15) 3x3 Gauze; 15 pr Gloves Cleanser: Normal Saline 1 x Per Day/15 Days Discharge Instructions: Wash your hands with soap and water. Remove old dressing, discard into plastic bag and place into trash. Cleanse the wound with Normal Saline prior to applying a clean dressing using gauze sponges, not tissues or cotton balls. Do not scrub or use excessive force. Pat dry using gauze sponges, not tissue or cotton balls. Cleanser: Soap and Water 1 x Per Day/15 Days Discharge Instructions: Gently cleanse wound with antibacterial soap, rinse and pat dry prior to dressing wounds Primary Dressing: Silvercel Small 2x2 (in/in) 1 x Per Day/15 Days Discharge Instructions: Apply Silvercel Small 2x2 (in/in) as instructed Secondary Dressing: Mepilex Border Flex, 4x4 (in/in) 1 x Per Day/15 Days Discharge Instructions: Apply to wound as directed. Do not cut. Wound #2 - Sacrum Cleanser: Byram Ancillary Kit - 15 Day Supply 1 x Per Day/15 Days Discharge Instructions: Use supplies as instructed; Kit contains: (15) Saline Bullets; (15) 3x3 Gauze; 15 pr Gloves Cleanser: Dakin 16 (oz) 0.25 1 x Per Day/15 Days Discharge Instructions: Use as directed. Cleanser: Normal Saline 1 x Per LPF/79 Days Discharge Instructions: Wash your hands with soap and water. Remove old dressing, discard into plastic bag and place into trash. Cleanse the wound with Normal Saline prior to applying a clean dressing using gauze sponges, not tissues or cotton balls. Do not scrub or use excessive force. [place]  dry using gauze sponges, not tissue or cotton balls. Cleanser: Soap and Water 1 x Per LOV/56 Days Discharge Instructions: Gently cleanse wound with antibacterial soap, rinse and pat  dry prior to dressing wounds Primary Dressing: Gauze 1 x Per Day/15 Days Discharge Instructions: Dakins gauze-As directed: dry, moistened with saline or moistened with Dakins Solution Secondary Dressing: ABD Pad 5x9 (in/in) 1 x Per Day/15 Days Discharge Instructions: Cover with ABD pad Secured With: Medipore Tape - 66M Medipore H Soft Cloth Surgical Tape, 2x2 (in/yd) 1 x Per Day/15 Days Electronic Signature(s) Signed: 09/04/2021 7:37:05 PM By: [name] PA-C Signed: 09/05/2021 12:55:43 PM By: Donnamarie Poag Entered By: Donnamarie Poag on 09/04/2021 09:12:09 Bolinger, [name] ([id number]) -------------------------------------------------------------------------------- Problem List Details Patient Name: [name] Date of Service: 09/04/2021 8:30 AM Medical Record Number: [id number] Patient Account Number: [id number] Date of Birth/Sex: Jun 18, 1956 (65 y.o. M) Treating RN: Donnamarie Poag Primary Care Provider: [name] Other Clinician: Referring Provider: [name] Treating Provider/Extender: Skipper Cliche in Treatment: 14 Active Problems ICD-10 Encounter Code Description Active Date MDM Diagnosis L89.613 Pressure ulcer of right heel, stage 3 05/29/2021 No Yes L89.154 Pressure ulcer of sacral region, stage 4 05/29/2021 No Yes L89.892 Pressure ulcer of other site, stage 2 08/21/2021 No Yes M62.81 Muscle weakness (generalized) 05/29/2021 No Yes F31.9 Bipolar disorder, unspecified 05/29/2021 No Yes E11.622 Type 2 diabetes mellitus with other skin ulcer 05/29/2021 No Yes I10 Essential (primary) hypertension 05/29/2021 No Yes Z79.01 Long term (current) use of anticoagulants 05/29/2021 No Yes 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 05/29/2021 No Yes Inactive Problems Resolved Problems Electronic Signature(s) Signed: 09/04/2021 8:59:36 AM By: [name] PA-C Entered By: [name] on 09/04/2021 08:59:35 Culpepper, Che[place]o  ([id number]) -------------------------------------------------------------------------------- Progress Note Details Patient Name: [name] Date of Service: 09/04/2021 8:30 AM Medical Record Number: [id number] Patient Account Number: [id number] Date of Birth/Sex: 1955-09-05 (65 y.o. M) Treating RN: Donnamarie Poag Primary Care Provider: [name] Other Clinician: Referring Provider: [name] Treating Provider/Extender: Skipper Cliche in Treatment: 14 Subjective Chief Complaint Information obtained from Patient Sacral, right gluteal, and right heel ulcers History of Present Illness (HPI) 05/29/2021 this is a patient who presents today for initial inspection here in the clinic concerning wounds that he has over the right heel and the sacral region. Unfortunately the sacral wound is starting to spread off to the right gluteal location due to how he sits always leaning towards the right side in his chair. His wife is present she is the primary caregiver though she is not home with him all the time she does have to work. She does do an excellent job however it appears to be in trying to keep things under good control for him. The patient is not able to change positions himself nor walk by himself so he is pretty much at the mercy of the position he is putting when she is gone and this tends to be his chair which she sits and most of the day. Obviously this I think is the main culprit for what is going on currently. It was actually in January 2020 when the sacral wound started. It was in September 2022 when the wound started to spread more to the right gluteal location. Subsequently in August 2022 is when he had been in a skilled nursing facility and the heel started to give him trouble as well. That does not seem to be doing nearly as poorly as the sacral region. He was hospitalized in October 2022  secondary to sepsis and this was in regard to the foot and was sent to  skilled nursing again he is now back at home. He did have a wound VAC for the sacral wound over the summer 2022 but being in and out of facilities this ended up getting sent back. The patient does have [facility] home health that comes out 1 time per week to help with care. His most recent hemoglobin A1c was 6.9 in August 2022. Patient's met past medical history includes generalized muscle weakness, bipolar disorder, diabetes mellitus type 2, hypertension, long-term use of anticoagulant therapy due to frequent blood clots/DVTs. He also has a history of traumatic brain injury. 07/24/2021 upon evaluation today patient appears to be doing decently well in regard to the pressure ulcer on the right heel as well as the sacral region. In general I think you are making some progress here which is great news. Overall the heel unfortunately had already closed previously when we saw him although it apparently reopened when he was working with physical therapy according to his wife. The area in the sacral region is doing well and looks clean there is still some depth here but I still think it would be difficult to wound VAC this region. His wife does an awesome job taking care of him. Is been so long since we have seen him because he has been in the hospital to be honest. 08/07/2021 upon evaluation today patient appears to be doing well at this time. Fortunately I do not see any signs of active infection locally or systemically at this time which is great news. No fevers, chills, nausea, vomiting, or diarrhea. Unfortunately after I saw him on the 24th he actually ended up in the hospital in the 27th due to being septic. This was not due to the wounds but after looking at records actually due to a UTI. Fortunately he is doing much better and very happy in that regard. I do not see any signs of infection locally nor systemically at this time. 08/21/2021 upon evaluation today patient appears to be doing well with regard to  his wound. Fortunately I think that the sacral region is doing decently well at this point which is great news. With regard to the foot this also does have some slough noted but I feel like we are making progress here. He does have some irritation around the right upper thigh/gluteal region. I feel like it is more towards the thigh. Nonetheless this does appear to be pressure related he spends a lot of time sitting up his caregiver states he really will not get in the bed and stay there like he should. 09/04/2021 upon evaluation patient appears to be doing decently well today in regard to the wound on his heel as well as the sacral area. I am actually very pleased with both and how things are appearing currently. There does not appear to be any evidence of active infection I think that his caregiver is doing an awesome job with regard to the wound care here. She is present today as well and I did discuss this with her. Objective Constitutional Well-nourished and well-hydrated in no acute distress. Vitals Time Taken: 8:36 AM, Height: 66 in, Temperature: 98.1 F, Pulse: 80 bpm, Respiratory Rate: 16 breaths/min, Blood Pressure: 171/91 mmHg. Respiratory normal breathing without difficulty. CORDARREL, STIEFEL ([id number]) Psychiatric this patient is able to make decisions and demonstrates good insight into disease process. Alert and Oriented x 3. pleasant and cooperative. General Notes: Upon  inspection patient's wound bed actually showed signs of good granulation and epithelization at this point. Fortunately I do not see any evidence of active infection locally or systemically which is great news and overall I think that we are headed in the correct direction. Integumentary (Hair, Skin) Wound #1 status is Open. Original cause of wound was Gradually Appeared. The date acquired was: 02/16/2021. The wound has been in treatment 14 weeks. The wound is located on the Right Calcaneus. The wound measures  0.8cm length x 2cm width x 0.1cm depth; 1.257cm^2 area and 0.126cm^3 volume. There is Fat Layer (Subcutaneous Tissue) exposed. There is no tunneling or undermining noted. There is a medium amount of serosanguineous drainage noted. There is large (67-100%) red, pink granulation within the wound bed. There is a small (1-33%) amount of necrotic tissue within the wound bed including Eschar and Adherent Slough. Wound #2 status is Open. Original cause of wound was Gradually Appeared. The date acquired was: 07/21/2018. The wound has been in treatment 14 weeks. The wound is located on the Sacrum. The wound measures 3cm length x 1.7cm width x 3cm depth; 4.006cm^2 area and 12.017cm^3 volume. There is muscle and Fat Layer (Subcutaneous Tissue) exposed. There is no undermining noted, however, there is tunneling at 12:00 with a maximum distance of 2.9cm. There is a large amount of serosanguineous drainage noted. There is large (67-100%) red, pink granulation within the wound bed. There is a small (1-33%) amount of necrotic tissue within the wound bed including Adherent Slough. Wound #3 status is Healed - Epithelialized. Original cause of wound was Gradually Appeared. The date acquired was: 08/14/2021. The wound has been in treatment 2 weeks. The wound is located on the Posterior Upper Leg. The wound measures 0cm length x 0cm width x 0cm depth; 0cm^2 area and 0cm^3 volume. There is a none present amount of drainage noted. There is no granulation within the wound bed. There is no necrotic tissue within the wound bed. 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 Nurse Visit as needed Home Health: [facility] for wound care. May utilize formulary equivalent dressing for wound treatment orders unless otherwise specified. Home Health Nurse may visit [facility] to address patient s wound care needs. - for sacrum and right heel Scheduled days for dressing changes to be completed; exception, patient has scheduled wound care visit that day. **Please direct any NON-WOUND related issues/requests for orders to patient's Primary Care Physician. **If current dressing causes regression in wound condition, may D/C ordered dressing product/s and apply Normal Saline Moist Dressing daily until next [place] or Other MD appointment. **Notify [facility] of regression in wound condition at [phone]. Bathing/ Shower/ Hygiene: May shower with wound dressing protected with water repellent cover or cast protector. No tub bath. Anesthetic (Use 'Patient Medications' Section for Anesthetic Order Entry): Lidocaine applied to wound bed Edema Control - Lymphedema / Segmental Compressive Device / Other: Elevate leg(s) parallel to the floor when sitting. DO YOUR BEST to sleep in the bed at night. DO NOT sleep in your recliner. Long hours of sitting in a recliner leads to swelling of the legs and/or potential  wounds on your backside. Non-Wound Condition: Additional non-wound orders/instructions: - AandD to excoriated areas of gluteus for protection where adhesive isn't needed to promote healing Off-Loading: Gel wheelchair cushion Low air-loss mattress (Group 2) Turn and reposition every 2 hours - keep pressure off of the sacrum and right heel wounds Other: - PRAFO boot in bed keep pressure off of sacrum/gluteus and right heel- Nomura, Bonham ([id number]) Additional Orders / Instructions: Follow Nutritious Diet and Increase Protein Intake WOUND #1: - Calcaneus Wound  Laterality: Right Cleanser: Byram Ancillary Kit - 15 Day Supply 1 x Per Day/15 Days Discharge Instructions: Use supplies as instructed; Kit contains: (15) Saline Bullets; (15) 3x3 Gauze; 15 pr Gloves Cleanser: Normal Saline 1 x Per Day/15 Days Discharge Instructions: Wash your hands with soap and water. Remove old dressing, discard into plastic bag and place into trash. Cleanse the wound with Normal Saline prior to applying a clean dressing using gauze sponges, not tissues or cotton balls. Do not scrub or use excessive force. Pat dry using gauze sponges, not tissue or cotton balls. Cleanser: Soap and Water 1 x Per Day/15 Days Discharge Instructions: Gently cleanse wound with antibacterial soap, rinse and pat dry prior to dressing wounds Primary Dressing: Silvercel Small 2x2 (in/in) 1 x Per Day/15 Days Discharge Instructions: Apply Silvercel Small 2x2 (in/in) as instructed Secondary Dressing: Mepilex Border Flex, 4x4 (in/in) 1 x Per Day/15 Days Discharge Instructions: Apply to wound as directed. Do not cut. WOUND #2: - Sacrum Wound Laterality: Cleanser: Byram Ancillary Kit - 15 Day Supply 1 x Per Day/15 Days Discharge Instructions: Use supplies as instructed; Kit contains: (15) Saline Bullets; (15) 3x3 Gauze; 15 pr Gloves Cleanser: Dakin 16 (oz) 0.25 1 x Per Day/15 Days Discharge Instructions: Use as directed. Cleanser: Normal Saline 1 x Per OMB/55 Days Discharge Instructions: Wash your hands with soap and water. Remove old dressing, discard into plastic bag and place into trash. Cleanse the wound with Normal Saline prior to applying a clean dressing using gauze sponges, not tissues or cotton balls. Do not scrub or use excessive force. Pat dry using gauze sponges, not tissue or cotton balls. Cleanser: Soap and Water 1 x Per Day/15 Days Discharge Instructions: Gently cleanse wound with antibacterial soap, rinse and pat dry prior to dressing wounds Primary Dressing: Gauze 1 x Per Day/15  Days Discharge Instructions: Dakins gauze-As directed: dry, moistened with saline or moistened with Dakins Solution Secondary Dressing: ABD Pad 5x9 (in/in) 1 x Per Day/15 Days Discharge Instructions: Cover with ABD pad Secured With: Medipore Tape - 33M Medipore H Soft Cloth Surgical Tape, 2x2 (in/yd) 1 x Per Day/15 Days 1. I would recommend that we going to continue with the wound care measures as before and the patient is in agreement with plan. This includes the use of the Dakin's moistened gauze dressing to the sacral area which I think is doing awesome. We will continue to cover this with an ABD pad secured with gauze and tape. 2. Also can recommend that for the heel we continue currently with the silver alginate dressing which I think is doing an awesome job reason a border foam to cover. We will see patient back for reevaluation in 2 weeks here in the clinic. If anything worsens or changes patient will contact our office for additional recommendations. This is due to the difficulty in getting the patient here from a mobility standpoint that we are seeing him in 2 weeks not 1. Electronic Signature(s) Signed: 09/04/2021 7:18:53 PM By: [name]  PA-C Entered By: [name] on 09/04/2021 19:18:53 Nurse, [name] ([id number]) -------------------------------------------------------------------------------- SuperBill Details Patient Name: [name] Date of Service: 09/04/2021 Medical Record Number: [id number] Patient Account Number: [id number] Date of Birth/Sex: 1955-11-17 (65 y.o. 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 Pressure ulcer of other site, stage 2 M62.81 Muscle  weakness (generalized) F31.9 Bipolar disorder, unspecified E11.622 Type 2 diabetes mellitus with other skin ulcer I10 Essential (primary) hypertension Z79.01 Long term (current) use of anticoagulants 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 Facility Procedures CPT4 Code: [id number] Description: 99214 - WOUND CARE VISIT-LEV 4 EST PT Modifier: Quantity: 1 Physician Procedures CPT4 Code: [id number] Description: 99214 - WC PHYS LEVEL 4 - EST PT Modifier: Quantity: 1 CPT4 Code: Description: ICD-10 Diagnosis Description L89.613 Pressure ulcer of right heel, 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

## 2021-09-05 NOTE — Progress Notes (Signed)
VASHAUN, OSMON ([id number]) Visit Report for 09/04/2021 Arrival Information Details Patient Name: [name], [name] Date of Service: 09/04/2021 8:30 AM Medical Record Number: [id number] Patient Account Number: [id number] Date of Birth/Sex: [birth year] (65 y.o. M) Treating RN: Donnamarie Poag Primary Care Tareq Dwan: [name] Other Clinician: Referring Gwendola Hornaday: [name] Treating Nykerria Macconnell/Extender: Skipper Cliche in Treatment: 14 Visit Information History Since Last Visit Added or deleted any medications: No Patient Arrived: Wheel Chair Had a fall or experienced change in No Arrival Time: 08:38 activities of daily living that may affect Accompanied By: wife risk of falls: Transfer Assistance: EasyPivot Patient Lift Hospitalized since last visit: No Patient Identification Verified: Yes Has Dressing in Place as Prescribed: Yes Secondary Verification Process Completed: Yes Pain Present Now: No Patient Requires Transmission-Based No Precautions: Patient Has Alerts: Yes Patient Alerts: Patient on Blood Thinner DIABETIC Eliquis Electronic Signature(s) Signed: 09/05/2021 12:55:43 PM By: Donnamarie Poag Entered By: Donnamarie Poag on 09/04/2021 08:39:10 Linhart, [name] ([id number]) -------------------------------------------------------------------------------- Clinic Level of Care Assessment Details Patient Name: [name] Date of Service: 09/04/2021 8:30 AM Medical Record Number: [id number] Patient Account Number: [id number] Date of Birth/Sex: 1955/08/09 (65 y.o. M) Treating RN: Donnamarie Poag Primary Care Angalena Cousineau: [name] Other Clinician: Referring Laiba Fuerte: [name] Treating Miara Emminger/Extender: Skipper Cliche in Treatment: 14 Clinic Level of Care Assessment Items TOOL 4 Quantity Score [] - Use when only an EandM is performed on FOLLOW-UP visit 0 ASSESSMENTS - Nursing Assessment / Reassessment [] - Reassessment of Co-morbidities  (includes updates in patient status) 0 [] - 0 Reassessment of Adherence to Treatment Plan ASSESSMENTS - Wound and Skin Assessment / Reassessment [] - Simple Wound Assessment / Reassessment - one wound 0 X- 3 5 Complex Wound Assessment / Reassessment - multiple wounds [] - 0 Dermatologic / Skin Assessment (not related to wound area) ASSESSMENTS - Focused Assessment [] - Circumferential Edema Measurements - multi extremities 0 [] - 0 Nutritional Assessment / Counseling / Intervention [] - 0 Lower Extremity Assessment (monofilament, tuning fork, pulses) [] - 0 Peripheral Arterial Disease Assessment (using hand held doppler) ASSESSMENTS - Ostomy and/or Continence Assessment and Care [] - Incontinence Assessment and Management 0 [] - 0 Ostomy Care Assessment and Management (repouching, etc.) PROCESS - Coordination of Care X - Simple Patient / Family Education for ongoing care 1 15 [] - 0 Complex (extensive) Patient / Family Education for ongoing care [] - 0 Staff obtains Programmer, systems, Records, Test Results / Process Orders X- 1 10 Staff telephones HHA, Nursing Homes / Clarify orders / etc [] - 0 Routine Transfer to another Facility (non-emergent condition) [] - 0 Routine Hospital Admission (non-emergent condition) [] - 0 New Admissions / Biomedical engineer / Ordering NPWT, Apligraf, etc. [] - 0 Emergency Hospital Admission (emergent condition) X- 1 10 Simple Discharge Coordination [] - 0 Complex (extensive) Discharge Coordination PROCESS - Special Needs [] - Pediatric / Minor Patient Management 0 [] - 0 Isolation Patient Management [] - 0 Hearing / Language / Visual special needs [] - 0 Assessment of Community assistance (transportation, D/C planning, etc.) [] - 0 Additional assistance / Altered mentation [] - 0 Support Surface(s) Assessment (bed, cushion, seat, etc.) INTERVENTIONS - Wound Cleansing / Measurement Glidden, Skyeler ([id number]) [] - 0 Simple  Wound Cleansing - one wound X- 3 5 Complex Wound Cleansing - multiple wounds X- 1 5 Wound Imaging (photographs - any number of wounds) [] - 0 Wound Tracing (instead of photographs) [] - 0 Simple Wound Measurement - one wound X- 3  5 Complex Wound Measurement - multiple wounds INTERVENTIONS - Wound Dressings X - Small Wound Dressing one or multiple wounds 3 10 [] - 0 Medium Wound Dressing one or multiple wounds [] - 0 Large Wound Dressing one or multiple wounds X- 1 5 Application of Medications - topical [] - 0 Application of Medications - injection INTERVENTIONS - Miscellaneous [] - External ear exam 0 [] - 0 Specimen Collection (cultures, biopsies, blood, body fluids, etc.) [] - 0 Specimen(s) / Culture(s) sent or taken to Lab for analysis [] - 0 Patient Transfer (multiple staff / Civil Service fast streamer / Similar devices) [] - 0 Simple Staple / Suture removal (25 or less) [] - 0 Complex Staple / Suture removal (26 or more) [] - 0 Hypo / Hyperglycemic Management (close monitor of Blood Glucose) [] - 0 Ankle / Brachial Index (ABI) - do not check if billed separately X- 1 5 Vital Signs Has the patient been seen at the hospital within the last three years: Yes Total Score: 125 Level Of Care: New/Established - Level 4 Electronic Signature(s) Signed: 09/05/2021 12:55:43 PM By: Donnamarie Poag Entered By: Donnamarie Poag on 09/04/2021 09:16:37 Lerew, [name] ([id number]) -------------------------------------------------------------------------------- Encounter Discharge Information Details Patient Name: [name] Date of Service: 09/04/2021 8:30 AM Medical Record Number: [id number] Patient Account Number: [id number] Date of Birth/Sex: Sep 08, 1955 (65 y.o. M) Treating RN: Donnamarie Poag Primary Care : [name] Other Clinician: Referring : [name] Treating /Extender: Skipper Cliche in Treatment: 14 Encounter Discharge Information  Items Discharge Condition: Stable Ambulatory Status: Wheelchair Discharge Destination: Home Health Telephoned: No Orders Sent: Yes Transportation: Private Auto Accompanied By: wife Schedule Follow-up Appointment: Yes Clinical Summary of Care: Electronic Signature(s) Signed: 09/05/2021 12:55:43 PM By: Donnamarie Poag Entered By: Donnamarie Poag on 09/04/2021 09:17:39 [place], [name] ([id number]) -------------------------------------------------------------------------------- Lower Extremity Assessment Details Patient Name: [name] Date of Service: 09/04/2021 8:30 AM Medical Record Number: [id number] Patient Account Number: [id number] Date of Birth/Sex: 19-Sep-1955 (65 y.o. M) Treating RN: Donnamarie Poag Primary Care : [name] Other Clinician: Referring : [name] Treating /Extender: Jeri Cos Weeks in Treatment: 14 Edema Assessment Assessed: [Left: No] Patrice Paradise: Yes] Edema: [Left: N] [Right: o] Electronic Signature(s) Signed: 09/05/2021 12:55:43 PM By: Donnamarie Poag Entered By: Donnamarie Poag on 09/04/2021 08:50:24 Sobiech, [name] ([id number]) -------------------------------------------------------------------------------- Multi Wound Chart Details Patient Name: [name] Date of Service: 09/04/2021 8:30 AM Medical Record Number: [id number] Patient Account Number: [id number] Date of Birth/Sex: 03-30-1956 (65 y.o. 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 Comorbid History: Anemia, Sleep Apnea, Coronary Anemia, Sleep Apnea, Coronary Anemia, Sleep Apnea, Coronary Artery Disease, Deep Vein Artery Disease, Deep Vein Artery Disease, Deep Vein Thrombosis, Hypertension, Type II Thrombosis, Hypertension, Type II Thrombosis, Hypertension, Type II Diabetes, History of pressure Diabetes, History of pressure Diabetes, History of pressure wounds, Neuropathy wounds, Neuropathy wounds, Neuropathy Date Acquired: 02/16/2021 07/21/2018 08/14/2021 Weeks of Treatment: _0 Wound Status: Open Open Healed - Epithelialized Wound Recurrence: No No No Measurements L x W x D (cm) 0.8x2x0.1 3x1.7x3 0x0x0 Area (cm) : 1.257 4.006 0 Volume (cm) : 0.126 12.017 0 %  Reduction in Area: 11.10% 88.00% 100.00% % Reduction in Volume: 10.60% 90.00% 100.00% Position 1 (o'clock): 12 Maximum Distance 1 (cm): 2.9 Tunneling: No Yes N/A Classification: Category/Stage III Category/Stage IV Category/Stage II Exudate Amount: Medium Large None Present Exudate Type: Serosanguineous Serosanguineous N/A Exudate Color: red, brown red, brown N/A Granulation Amount: Large (67-100%) Large (67-100%) None Present (0%) Granulation Quality: Red, Pink Red, Pink N/A Necrotic Amount: Small (1-33%) Small (1-33%) None Present (0%) Necrotic Tissue: Eschar, Adherent [place] N/A Exposed Structures: Fat Layer (Subcutaneous Tissue): Fat Layer (Subcutaneous Tissue): Fascia: No Yes Yes Fat Layer (Subcutaneous Tissue): Fascia: No Muscle: Yes No Tendon: No Fascia: No Tendon: No Muscle: No Tendon: No Muscle: No Joint: No Joint: No Joint: No Bone: No Bone: No Bone: No Epithelialization: None None None Treatment Notes [name], [name] ([id number]) Electronic Signature(s) Signed: 09/05/2021 12:55:43 PM By: Donnamarie Poag Entered By: Donnamarie Poag on 09/04/2021  09:09:55 Cashatt, [name] ([id number]) -------------------------------------------------------------------------------- [place] Details Patient Name: [name] Date of Service: 09/04/2021 8:30 AM Medical Record Number: [id number] Patient Account Number: [id number] Date of Birth/Sex: 02/09/1956 (65 y.o. M) Treating RN: Donnamarie Poag Primary Care : [name] Other Clinician: Referring : [name] Treating /Extender: Skipper Cliche in Treatment: 14 Active Inactive Wound/Skin Impairment Nursing Diagnoses: Impaired tissue integrity Knowledge deficit related to smoking impact on wound healing Knowledge deficit related to ulceration/compromised skin integrity Goals: Patient/caregiver will verbalize understanding of skin care regimen Date Initiated: 05/29/2021 Date Inactivated: 07/24/2021 Target Resolution Date: 06/14/2021 Goal Status: Met Ulcer/skin breakdown will have a volume reduction of 30% by week 4 Date Initiated: 05/29/2021 Date Inactivated: 09/04/2021 Target Resolution Date: 06/28/2021 Goal Status: Met Ulcer/skin breakdown will have a volume reduction of 50% by week 8 Date Initiated: 05/29/2021 Target Resolution Date: 07/29/2021 Goal Status: Active Ulcer/skin breakdown will have a volume reduction of 80% by week 12 Date Initiated: 05/29/2021 Target Resolution Date: 08/28/2021 Goal Status: Active Interventions: Assess patient/caregiver ability to obtain necessary supplies Assess patient/caregiver ability to perform ulcer/skin care regimen upon admission and as needed Assess ulceration(s) every visit Notes: Electronic Signature(s) Signed: 09/05/2021 12:55:43 PM By: Donnamarie Poag Entered By: Donnamarie Poag on 09/04/2021 08:50:48 Fabio, [name] ([id number]) -------------------------------------------------------------------------------- Pain Assessment Details Patient Name: [name] Date  of Service: 09/04/2021 8:30 AM Medical Record Number: [id number] Patient Account Number: [id number] Date of Birth/Sex: [birth year] (65 y.o. M) Treating RN: Donnamarie Poag Primary Care : [name] Other Clinician: Referring : [name] Treating /Extender: Skipper Cliche in Treatment: 14 Active Problems Location of Pain Severity and Description of Pain Patient Has Paino No Site Locations Rate the pain. Current Pain Level: 0 Pain Management and Medication Current Pain Management: Electronic Signature(s) Signed: 09/05/2021 12:55:43 PM By: Donnamarie Poag Entered By: Donnamarie Poag on 09/04/2021 08:41:52 Flink, [name] ([id number]) -------------------------------------------------------------------------------- Patient/Caregiver Education Details Patient Name: [name] Date of Service: 09/04/2021 8:30 AM Medical Record Number: [id number] Patient Account Number: [id number] Date of Birth/Gender: [birth year] (65 y.o. M) Treating RN: Donnamarie Poag Primary Care Physician: [name] Other Clinician: Referring Physician: [name] Treating Physician/Extender: Skipper Cliche in Treatment: 14 Education Assessment Education Provided To: Caregiver Education Topics Provided Basic Hygiene: Nutrition: Offloading: Pressure: Wound/Skin Impairment: Engineer, maintenance) Signed: 09/05/2021 12:55:43 PM By: Donnamarie Poag Entered By: Donnamarie Poag on 09/04/2021 09:16:57 Brakebill, [name] ([id number]) -------------------------------------------------------------------------------- Wound Assessment Details Patient Name: [name] Date of Service: 09/04/2021 8:30 AM Medical Record Number: [id number] Patient Account Number: [id number] Date of Birth/Sex: 1955/07/13 (65 y.o. 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 Wounding Event: Gradually Appeared Etiology: Date Acquired: 02/16/2021 Wound Open Weeks Of Treatment: 14 Status: Clustered Wound: No Comorbid Anemia, Sleep Apnea, Coronary Artery Disease, Deep History: Vein Thrombosis, Hypertension, Type II Diabetes, History of pressure wounds, Neuropathy Photos Wound Measurements Length: (cm) 0.8 Width: (cm) 2 Depth: (cm) 0.1 Area: (cm) 1.257 Volume: (cm) 0.126 % Reduction in Area: 11.1% % Reduction in Volume: 10.6% Epithelialization: None Tunneling: No Undermining: No Wound Description Classification: Category/Stage III Exudate Amount: Medium Exudate Type: Serosanguineous Exudate Color: red, brown Foul Odor After Cleansing: No Slough/Fibrino Yes Wound Bed Granulation Amount: Large (67-100%) Exposed Structure Granulation Quality: Red, Pink Fascia Exposed: No Necrotic Amount: Small (1-33%) Fat Layer (Subcutaneous Tissue) Exposed: Yes Necrotic Quality: Eschar, Adherent Slough Tendon Exposed: No Muscle Exposed: No Joint Exposed: No Bone Exposed: No Treatment Notes Wound #1 (Calcaneus) Wound Laterality: Right Cleanser Byram Ancillary Kit - 15 Day Supply Discharge Instruction: Use supplies as instructed; Kit contains: (15) Saline Bullets; (15) 3x3 Gauze; 15 pr Gloves Grange, Jasiyah ([id number]) Normal Saline Discharge Instruction: Wash your hands with soap and water. Remove old dressing, discard into plastic bag and place into trash. Cleanse the wound with Normal Saline prior to applying a clean dressing using gauze sponges, not tissues or cotton balls. Do not scrub or use excessive force. Pat dry using gauze sponges, not tissue or cotton balls. Soap and Water Discharge Instruction: Gently cleanse wound with antibacterial soap, rinse and pat dry  prior to dressing wounds Peri-Wound Care Topical Primary Dressing Silvercel Small 2x2 (in/in) Discharge Instruction: Apply Silvercel Small 2x2 (in/in) as instructed Secondary Dressing Mepilex Border Flex, 4x4 (in/in) Discharge Instruction: Apply to wound as directed. Do not cut. Secured With Compression Wrap Compression Stockings Environmental education officer) Signed: 09/05/2021 12:55:43 PM By: Donnamarie Poag Entered By: Donnamarie Poag on 09/04/2021 08:48:05 [place], [name] ([id number]) -------------------------------------------------------------------------------- Wound Assessment Details Patient Name: [name] Date of Service: 09/04/2021 8:30 AM Medical Record Number: [id number] Patient Account Number: [id number] Date of Birth/Sex: [birth year] (65 y.o. M) Treating RN: Donnamarie Poag Primary Care : [name] Other Clinician: Referring : [name] Treating /Extender: Skipper Cliche in Treatment: 14 Wound Status Wound Number: 2 Primary Pressure Ulcer Etiology: Wound Location: Sacrum Wound Open Wounding Event: Gradually Appeared Status: Date Acquired: 07/21/2018 Comorbid Anemia, Sleep Apnea, Coronary Artery Disease, Deep Vein Weeks Of Treatment: 14 History: Thrombosis, Hypertension, Type II Diabetes, History of Clustered Wound: No pressure wounds, Neuropathy Photos Wound Measurements Length: (cm) 3 % Red Width: (cm) 1.7 % Red Depth: (cm) 3 Epith Area: (cm) 4.006 Tunn Volume: (cm) 12.017 P Ma uction in Area: 88% uction in Volume: 90% elialization: None eling: Yes osition (o'clock): 12 ximum Distance: (cm) 2.9 Undermining: No Wound Description Classification: Category/Stage IV Foul Exudate Amount: Large Sloug Exudate Type: Serosanguineous Exudate Color: red, brown Odor After Cleansing: No h/Fibrino Yes Wound Bed Granulation Amount: Large (67-100%) Exposed Structure Granulation Quality: Red, Pink Fascia  Exposed: No Necrotic Amount: Small (1-33%) Fat Layer (Subcutaneous Tissue) Exposed: Yes Necrotic Quality: Adherent Slough Tendon Exposed: No Muscle Exposed: Yes Necrosis of Muscle: No Joint Exposed: No Bone Exposed: No Treatment Notes Wound #2 (Sacrum) Cleanser Byram Ancillary Kit - [address] Day Supply Filyaw, [name] ([id number]) Discharge Instruction: Use supplies as instructed; Kit contains: (15) Saline Bullets; (15) 3x3 Gauze; 15 pr Gloves Dakin 16 (  oz) 0.25 Discharge Instruction: Use as directed. Normal Saline Discharge Instruction: Wash your hands with soap and water. Remove old dressing, discard into plastic bag and place into trash. Cleanse the wound with Normal Saline prior to applying a clean dressing using gauze sponges, not tissues or cotton balls. Do not scrub or use excessive force. Pat dry using gauze sponges, not tissue or cotton balls. Soap and Water Discharge Instruction: Gently cleanse wound with antibacterial soap, rinse and pat dry prior to dressing wounds Peri-Wound Care Topical Primary Dressing Gauze Discharge Instruction: Dakins gauze-As directed: dry, moistened with saline or moistened with Dakins Solution Secondary Dressing ABD Pad 5x9 (in/in) Discharge Instruction: Cover with ABD pad Secured With Medipore Tape - 5M Medipore H Soft Cloth Surgical Tape, 2x2 (in/yd) Compression Wrap Compression Stockings Add-Ons Electronic Signature(s) Signed: 09/05/2021 12:55:43 PM By: Donnamarie Poag Entered By: Donnamarie Poag on 09/04/2021 08:49:19 [place], [name] ([id number]) -------------------------------------------------------------------------------- Wound Assessment Details Patient Name: [name] Date of Service: 09/04/2021 8:30 AM Medical Record Number: [id number] Patient Account Number: [id number] Date of Birth/Sex: 1956/05/05 (65 y.o. M) Treating RN: Donnamarie Poag Primary Care : [name] Other Clinician: Referring :  [name] Treating /Extender: Skipper Cliche in Treatment: 14 Wound Status Wound Number: 3 Primary Pressure Ulcer Etiology: Wound Location: Posterior Upper Leg Wound Healed - Epithelialized Wounding Event: Gradually Appeared Status: Date Acquired: 08/14/2021 Comorbid Anemia, Sleep Apnea, Coronary Artery Disease, Deep Vein Weeks Of Treatment: 2 History: Thrombosis, Hypertension, Type II Diabetes, History of Clustered Wound: No pressure wounds, Neuropathy Photos Wound Measurements Length: (cm) 0 Width: (cm) 0 Depth: (cm) 0 Area: (cm) 0 Volume: (cm) 0 % Reduction in Area: 100% % Reduction in Volume: 100% Epithelialization: None Wound Description Classification: Category/Stage II Exudate Amount: None Present Foul Odor After Cleansing: No Slough/Fibrino No Wound Bed Granulation Amount: None Present (0%) Exposed Structure Necrotic Amount: None Present (0%) Fascia Exposed: No Fat Layer (Subcutaneous Tissue) Exposed: No Tendon Exposed: No Muscle Exposed: No Joint Exposed: No Bone Exposed: No Electronic Signature(s) Signed: 09/05/2021 12:55:43 PM By: Donnamarie Poag Entered By: Donnamarie Poag on 09/04/2021 08:50:01 Salman, [name] ([id number]) -------------------------------------------------------------------------------- [place] Details Patient Name: [name] Date of Service: 09/04/2021 8:30 AM Medical Record Number: [id number] Patient Account Number: [id number] Date of Birth/Sex: 09/01/1955 (65 y.o. M) Treating RN: Donnamarie Poag Primary Care : [name] Other Clinician: Referring : [name] Treating /Extender: Skipper Cliche in Treatment: 14 Vital Signs Time Taken: 08:36 Temperature (F): 98.1 Height (in): 66 Pulse (bpm): 80 Respiratory Rate (breaths/min): 16 Blood Pressure (mmHg): 171/91 Reference Range: 80 - 120 mg / dl Electronic Signature(s) Signed: 09/05/2021 12:55:43 PM By: Donnamarie Poag Entered ByDonnamarie Poag on 09/04/2021 08:39:39

## 2021-09-20 ENCOUNTER — Telehealth (INDEPENDENT_AMBULATORY_CARE_PROVIDER_SITE_OTHER): Payer: Self-pay | Admitting: Psychiatry

## 2021-09-20 ENCOUNTER — Other Ambulatory Visit: Payer: Self-pay

## 2021-09-20 DIAGNOSIS — F3178 Bipolar disorder, in full remission, most recent episode mixed: Secondary | ICD-10-CM

## 2021-09-20 NOTE — Progress Notes (Signed)
No response to call or text or video invite  

## 2021-09-25 ENCOUNTER — Other Ambulatory Visit: Payer: Self-pay

## 2021-09-25 ENCOUNTER — Encounter: Payer: Medicare Other | Admitting: Physician Assistant

## 2021-09-25 DIAGNOSIS — E11622 Type 2 diabetes mellitus with other skin ulcer: Secondary | ICD-10-CM | POA: Diagnosis not present

## 2021-09-25 NOTE — Progress Notes (Addendum)
AYODEJI, KEIMIG ([id number]) ?Visit Report for 09/25/2021 ?Chief Complaint Document Details ?Patient Name: KAIMEN, PEINE ?Date of Service: 09/25/2021 8:30 AM ?Medical Record Number: [id number] ?Patient Account Number: [id number] ?Date of Birth/Sex: 05/17/1956 (66 y.o. M) ?Treating RN: Donnamarie Poag ?Primary Care Provider: [name] Other Clinician: ?Referring Provider: [name] ?Treating Provider/Extender: Jeri Cos ?Weeks in Treatment: 17 ?Information Obtained from: Patient ?Chief Complaint ?Sacral, right gluteal, and bilateral heel ulcers ?Electronic Signature(s) ?Signed: 09/25/2021 9:27:36 AM By: [name] PA-C ?Previous Signature: 09/25/2021 9:27:23 AM Version By: [name] PA-C ?Previous Signature: 09/25/2021 8:39:36 AM Version By: [name] PA-C ?Entered By: [name] on 09/25/2021 09:27:36 ?REDELL, NAZIR ([id number]) ?-------------------------------------------------------------------------------- ?Debridement Details ?Patient Name: JAVARIS, WIGINGTON ?Date of Service: 09/25/2021 8:30 AM ?Medical Record Number: [id number] ?Patient Account Number: [id number] ?Date of Birth/Sex: 12-18-1955 (66 y.o. M) ?Treating RN: Donnamarie Poag ?Primary Care Provider: [name] Other Clinician: ?Referring Provider: [name] ?Treating Provider/Extender: Jeri Cos ?Weeks in Treatment: 17 ?Debridement Performed for ?Wound #4 Left Calcaneus ?Assessment: ?Performed By: Physician Tommie Sams., PA-C ?Debridement Type: Debridement ?Level of Consciousness (Pre- ?Awake and Alert ?procedure): ?Pre-procedure Verification/Time Out ?Yes - 09:08 ?Taken: ?Start Time: 09:09 ?Pain Control: Lidocaine ?Total Area Debrided (L x W): 7.5 (cm) x 6.5 (cm) = 48.75 (cm?) ?Tissue and other material ?Viable, Non-Viable, Skin: Dermis , Skin: Epidermis ?debrided: ?Level: Skin/Epidermis ?Debridement Description: Selective/Open Wound ?Instrument: Curette ?Bleeding: Moderate ?Hemostasis  Achieved: Pressure ?Response to Treatment: Procedure was tolerated well ?Level of Consciousness (Post- ?Awake and Alert ?procedure): ?Post Debridement Measurements of Total Wound ?Length: (cm) 7.5 ?Stage: Unstageable/Unclassified ?Width: (cm) 6.5 ?Depth: (cm) 0.1 ?Volume: (cm?) 3.829 ?Character of Wound/Ulcer Post Debridement: Improved ?Post Procedure Diagnosis ?Same as Pre-procedure ?Electronic Signature(s) ?Signed: 09/25/2021 12:03:55 PM By: Donnamarie Poag ?Signed: 09/25/2021 4:21:45 PM By: [name] PA-C ?Entered ByDonnamarie Poag on 09/25/2021 09:15:41 ?ORESTE, MAJEED ([id number]) ?-------------------------------------------------------------------------------- ?HPI Details ?Patient Name: BEDFORD, WINSOR ?Date of Service: 09/25/2021 8:30 AM ?Medical Record Number: [id number] ?Patient Account Number: [id number] ?Date of Birth/Sex: [birth year] (66 y.o. M) ?Treating RN: Donnamarie Poag ?Primary Care Provider: [name] Other Clinician: ?Referring Provider: [name] ?Treating Provider/Extender: Jeri Cos ?Weeks in Treatment: 17 ?History of Present Illness ?HPI Description: 05/29/2021 this is a patient who presents today for initial inspection here in the clinic concerning wounds that he has over the ?right heel and the sacral region. Unfortunately the sacral wound is starting to spread off to the right gluteal location due to how he sits always ?leaning towards the right side in his chair. His wife is present she is the primary caregiver though she is not home with him all the time she does ?have to work. She does do an excellent job however it appears to be in trying to keep things under good control for him. The patient is not able to ?change positions himself nor walk by himself so he is pretty much at the mercy of the position he is putting when she is gone and this tends to be ?his chair which she sits and most of the day. Obviously this I think is the main culprit for what is going on  currently. ?It was actually in January 2020 when the sacral wound started. It was in September 2022 when the wound started to spread more to the right ?gluteal location. Subsequently in August 2022 is when he had been in a skilled nursing facility and the heel started to give him trouble as well. ?That does not seem to be doing nearly  as poorly as the sacral region. He was hospitalized in October 2022 secondary to sepsis and this was in ?regard to the foot and was sent to skilled nursing again he is now back at home. He did have a wound VAC for the sacral wound over the summer ?2022 but being in and out of facilities this ended up getting sent back. The patient does have [facility] home health that comes out 1 time per ?week to help with care. His most recent hemoglobin A1c was 6.9 in August 2022. ?Patient's met past medical history includes generalized muscle weakness, bipolar disorder, diabetes mellitus type 2, hypertension, long-term use ?of anticoagulant therapy due to frequent blood clots/DVTs. He also has a history of traumatic brain injury. ?07/24/2021 upon evaluation today patient appears to be doing decently well in regard to the pressure ulcer on the right heel as well as the sacral ?region. In general I think you are making some progress here which is great news. Overall the heel unfortunately had already closed previously ?when we saw him although it apparently reopened when he was working with physical therapy according to his wife. The area in the sacral region ?is doing well and looks clean there is still some depth here but I still think it would be difficult to wound VAC this region. His wife does an ?awesome job taking care of him. Is been so long since we have seen him because he has been in the hospital to be honest. ?08/07/2021 upon evaluation today patient appears to be doing well at this time. Fortunately I do not see any signs of active infection locally or ?systemically at this time which is  great news. No fevers, chills, nausea, vomiting, or diarrhea. Unfortunately after I saw him on the 24th he ?actually ended up in the hospital in the 27th due to being septic. This was not due to the wounds but after looking at records actually due to a ?UTI. Fortunately he is doing much better and very happy in that regard. I do not see any signs of infection locally nor systemically at this time. ?08/21/2021 upon evaluation today patient appears to be doing well with regard to his wound. Fortunately I think that the sacral region is doing ?decently well at this point which is great news. With regard to the foot this also does have some slough noted but I feel like we are making ?progress here. He does have some irritation around the right upper thigh/gluteal region. I feel like it is more towards the thigh. Nonetheless this ?does appear to be pressure related he spends a lot of time sitting up his caregiver states he really will not get in the bed and stay there like he ?should. ?09/04/2021 upon evaluation patient appears to be doing decently well today in regard to the wound on his heel as well as the sacral area. I am ?actually very pleased with both and how things are appearing currently. There does not appear to be any evidence of active infection I think that ?his caregiver is doing an awesome job with regard to the wound care here. She is present today as well and I did discuss this with her. ?09/25/2021 upon evaluation today patient appears to be doing well with regard to his wound on the sacral region. His right heel is also doing well. ?Unfortunately he has a new area on his left heel which does appear to be pressure injury. I do not see any evidence of active infection locally or ?  systemically which is great news no fevers, chills, nausea, vomiting, or diarrhea. ?Electronic Signature(s) ?Signed: 09/25/2021 9:27:48 AM By: [name] PA-C ?Entered By: [name] on 09/25/2021 09:27:48 ?XIAN, ALVES ([id number]) ?-------------------------------------------------------------------------------- ?Physical Exam Details ?Patient Name: ZOHAIR, EPP ?Date of Service: 09/25/2021 8:30 AM ?Medical R

## 2021-09-25 NOTE — Progress Notes (Addendum)
VENCIL, LIBERTI ([id number]) ?Visit Report for 09/25/2021 ?Arrival Information Details ?Patient Name: [name], [name] ?Date of Service: 09/25/2021 8:30 AM ?Medical Record Number: [id number] ?Patient Account Number: [id number] ?Date of Birth/Sex: 1956/02/22 (66 y.o. M) ?Treating RN: Donnamarie Poag ?Primary Care Matheu Ploeger: [name] Other Clinician: ?Referring Shandy Vi: [name] ?Treating Evella Kasal/Extender: Jeri Cos ?Weeks in Treatment: 17 ?Visit Information History Since Last Visit ?Added or deleted any medications: No ?Patient Arrived: Wheel Chair ?Had a fall or experienced change in No ?Arrival Time: 08:32 ?activities of daily living that may affect ?Accompanied By: wife ?risk of falls: ?Transfer Assistance: Manual ?Hospitalized since last visit: No ?Patient Identification Verified: Yes ?Has Dressing in Place as Prescribed: Yes ?Secondary Verification Process Completed: Yes ?Pain Present Now: No ?Patient Requires Transmission-Based No ?Precautions: ?Patient Has Alerts: Yes ?Patient Alerts: Patient on Blood ?Thinner ?DIABETIC ?Eliquis ?Electronic Signature(s) ?Signed: 09/25/2021 12:03:55 PM By: Donnamarie Poag ?Entered ByDonnamarie Poag on 09/25/2021 08:34:32 ?[name], [name] ([id number]) ?-------------------------------------------------------------------------------- ?Encounter Discharge Information Details ?Patient Name: [name], [name] ?Date of Service: 09/25/2021 8:30 AM ?Medical Record Number: [id number] ?Patient Account Number: [id number] ?Date of Birth/Sex: Nov 08, 1955 (66 y.o. M) ?Treating RN: Donnamarie Poag ?Primary Care Tyniesha Howald: [name] Other Clinician: ?Referring Rhondalyn Clingan: [name] ?Treating Tirth Cothron/Extender: Jeri Cos ?Weeks in Treatment: 17 ?Encounter Discharge Information Items Post Procedure Vitals ?Discharge Condition: Stable ?Temperature (?F): 98.1 ?Ambulatory Status: Wheelchair ?Pulse (bpm): 82 ?Discharge Destination: Home Health ?Respiratory Rate  (breaths/min): 16 ?Telephoned: No ?Blood Pressure (mmHg): 145/81 ?Orders Sent: Yes ?Transportation: Private Auto ?Accompanied By: wife ?Schedule Follow-up Appointment: Yes ?Clinical Summary of Care: ?Electronic Signature(s) ?Signed: 09/25/2021 12:03:55 PM By: Donnamarie Poag ?Entered ByDonnamarie Poag on 09/25/2021 09:25:18 ?[name], [name] ([id number]) ?-------------------------------------------------------------------------------- ?Lower Extremity Assessment Details ?Patient Name: [name], [name] ?Date of Service: 09/25/2021 8:30 AM ?Medical Record Number: [id number] ?Patient Account Number: [id number] ?Date of Birth/Sex: 02-01-1956 (66 y.o. M) ?Treating RN: Donnamarie Poag ?Primary Care Jenee Spaugh: [name] Other Clinician: ?Referring Deeric Cruise: [name] ?Treating Declyn Delsol/Extender: Jeri Cos ?Weeks in Treatment: 17 ?Edema Assessment ?Assessed: [Left: Yes] [Right: Yes] ?Edema: [Left: Yes] [Right: Yes] ?Electronic Signature(s) ?Signed: 09/25/2021 12:03:55 PM By: Donnamarie Poag ?Entered ByDonnamarie Poag on 09/25/2021 08:47:56 ?[name], [name] ([id number]) ?-------------------------------------------------------------------------------- ?Multi Wound Chart Details ?Patient Name: [name], [name] ?Date of Service: 09/25/2021 8:30 AM ?Medical Record Number: [id number] ?Patient Account Number: [id number] ?Date of Birth/Sex: 1955/11/14 (66 y.o. M) ?Treating RN: Donnamarie Poag ?Primary Care Monterrius Cardosa: [name] Other Clinician: ?Referring Alder Murri: [name] ?Treating Alanson Hausmann/Extender: Jeri Cos ?Weeks in Treatment: 17 ?Vital Signs ?Height(in): 66 ?Pulse(bpm): 82 ?Weight(lbs): ?Blood Pressure(mmHg): 145/81 ?Body Mass Index(BMI): ?Temperature(??F): 98.182 ?Respiratory Rate(breaths/min): 16 ?Photos: ?Wound Location: Right Calcaneus Sacrum Left Calcaneus ?Wounding Event: Gradually Appeared Gradually Appeared Blister ?Primary Etiology: Pressure Ulcer Pressure Ulcer Pressure Ulcer ?Secondary  Etiology: Diabetic Wound/Ulcer of the Lower N/A N/A ?Extremity ?Comorbid History: Anemia, Sleep Apnea, Coronary Anemia, Sleep Apnea, Coronary Anemia, Sleep Apnea, Coronary ?Artery Disease, Deep Vein Artery Disease, Deep Vein Artery Disease, Deep Vein ?Thrombosis, Hypertension, Type II Thrombosis, Hypertension, Type II Thrombosis, Hypertension, Type II ?Diabetes, History of pressure Diabetes, History of pressure Diabetes, History of pressure ?wounds, Neuropathy wounds, Neuropathy wounds, Neuropathy ?Date Acquired: 02/16/2021 07/21/2018 09/20/2021 ?Weeks of Treatment: 17 17 0 ?Wound Status: Open Open Open ?Wound Recurrence: No No No ?Measurements L x W x D (cm) 0.2x1x0.1 2x1.5x3 7.5x6.5x0.1 ?Area (cm?) : 0.157 2.356 38.288 ?Volume (cm?) : 0.016 7.069 3.829 ?% Reduction in Area: 88.90% 92.90% N/A ?% Reduction in Volume: 88.70% 94.10% N/A ?Position 1 (o'clock): 12 ?Maximum Distance 1 (cm): 2 ?Tunneling: No Yes No ?  Classification: Category/Stage III Category/Stage IV Unstageable/Unclassified ?Exudate Amount: Medium Large Large ?Exudate Type: Serosanguineous Serosanguineous Serous ?Exudate Color: red, brown red, brown amber ?Granulation Amount: None Present (0%) Large (67-100%) None Present (0%) ?Granulation Quality: N/A Red, Pink N/A ?Necrotic Amount: Large (67-100%) Small (1-33%) Large (67-100%) ?Necrotic Tissue: Eschar Adherent [place] ?Exposed Structures: ?Fat Layer (Subcutaneous Tissue): ?Fat Layer (Subcutaneous Tissue): Fat Layer (Subcutaneous Tissue): ?Yes Yes Yes ?Fascia: No ?Muscle: Yes ?Fascia: No ?Tendon: No ?Fascia: No ?Tendon: No ?Muscle: No ?Tendon: No ?Muscle: No ?Joint: No ?Joint: No ?Joint: No ?Bone: No ?Bone: No ?Bone: No ?Epithelialization: None None N/A ?Treatment Notes ?[name], [name] ([id number]) ?Electronic Signature(s) ?Signed: 09/25/2021 12:03:55 PM By: Donnamarie Poag ?Entered ByDonnamarie Poag on 09/25/2021 09:08:03 ?[name], [name]  ([id number]) ?-------------------------------------------------------------------------------- ?Multi-Disciplinary Care Plan Details ?Patient Name: [name], [name] ?Date of Service: 09/25/2021 8:30 AM ?Medical Record Number: [id number] ?Patient Account Number: [id number] ?Date of Birth/Sex: 1956-03-08 (66 y.o. M) ?Treating RN: Donnamarie Poag ?Primary Care Maynard David: [name] Other Clinician: ?Referring Annick Dimaio: [name] ?Treating Sherleen Pangborn/Extender: Jeri Cos ?Weeks in Treatment: 17 ?Active Inactive ?Electronic Signature(s) ?Signed: 10/15/2021 10:40:16 AM By: [name], BSN, RN, CWS, Kim RN, BSN ?Signed: 10/15/2021 4:39:42 PM By: Donnamarie Poag ?Previous Signature: 09/25/2021 12:03:55 PM Version By: Donnamarie Poag ?Entered By: [name], BSN, RN, CWS, Kim on 10/15/2021 10:40:16 ?[name], [name] ([id number]) ?-------------------------------------------------------------------------------- ?Pain Assessment Details ?Patient Name: [name], [name] ?Date of Service: 09/25/2021 8:30 AM ?Medical Record Number: [id number] ?Patient Account Number: [id number] ?Date of Birth/Sex: 1956-02-05 (66 y.o. M) ?Treating RN: Donnamarie Poag ?Primary Care Naythen Heikkila: [name] Other Clinician: ?Referring Sayvon Arterberry: [name] ?Treating Ijanae Macapagal/Extender: Jeri Cos ?Weeks in Treatment: 17 ?Active Problems ?Location of Pain Severity and Description of Pain ?Patient Has Paino No ?Site Locations ?Rate the pain. ?Current Pain Level: 0 ?Pain Management and Medication ?Current Pain Management: ?Electronic Signature(s) ?Signed: 09/25/2021 12:03:55 PM By: Donnamarie Poag ?Entered ByDonnamarie Poag on 09/25/2021 08:38:36 ?[name], [name] ([id number]) ?-------------------------------------------------------------------------------- ?Patient/Caregiver Education Details ?Patient Name: MARTEZE, MARUCA ?Date of Service: 09/25/2021 8:30 AM ?Medical Record Number: [id number] ?Patient Account Number: [id number] ?Date of Birth/Gender:  [birth year] (66 y.o. M) ?Treating RN: Donnamarie Poag ?Primary Care Physician: [name] Other Clinician: ?Referring Physician: [name] ?Treating Physician/Extender: Jeri Cos ?Weeks in Treatment: 17 ?Education Assessment ?Education

## 2021-10-06 ENCOUNTER — Emergency Department: Payer: Medicare Other

## 2021-10-06 ENCOUNTER — Inpatient Hospital Stay
Admission: EM | Admit: 2021-10-06 | Discharge: 2021-10-11 | DRG: 871 | Disposition: A | Discharge status: Skilled Nursing Facility | Payer: Medicare Other | Attending: Internal Medicine | Admitting: Internal Medicine

## 2021-10-06 DIAGNOSIS — N35819 Other urethral stricture, male, unspecified site: Secondary | ICD-10-CM | POA: Diagnosis present

## 2021-10-06 DIAGNOSIS — R339 Retention of urine, unspecified: Secondary | ICD-10-CM | POA: Diagnosis present

## 2021-10-06 DIAGNOSIS — R410 Disorientation, unspecified: Secondary | ICD-10-CM

## 2021-10-06 DIAGNOSIS — Z87440 Personal history of urinary (tract) infections: Secondary | ICD-10-CM

## 2021-10-06 DIAGNOSIS — E1122 Type 2 diabetes mellitus with diabetic chronic kidney disease: Secondary | ICD-10-CM | POA: Diagnosis present

## 2021-10-06 DIAGNOSIS — E1165 Type 2 diabetes mellitus with hyperglycemia: Secondary | ICD-10-CM | POA: Diagnosis present

## 2021-10-06 DIAGNOSIS — D638 Anemia in other chronic diseases classified elsewhere: Secondary | ICD-10-CM | POA: Diagnosis present

## 2021-10-06 DIAGNOSIS — G9341 Metabolic encephalopathy: Secondary | ICD-10-CM | POA: Diagnosis present

## 2021-10-06 DIAGNOSIS — F411 Generalized anxiety disorder: Secondary | ICD-10-CM | POA: Diagnosis present

## 2021-10-06 DIAGNOSIS — L97412 Non-pressure chronic ulcer of right heel and midfoot with fat layer exposed: Secondary | ICD-10-CM | POA: Diagnosis present

## 2021-10-06 DIAGNOSIS — A4101 Sepsis due to Methicillin susceptible Staphylococcus aureus: Secondary | ICD-10-CM | POA: Diagnosis not present

## 2021-10-06 DIAGNOSIS — Z7901 Long term (current) use of anticoagulants: Secondary | ICD-10-CM | POA: Diagnosis not present

## 2021-10-06 DIAGNOSIS — Z79899 Other long term (current) drug therapy: Secondary | ICD-10-CM

## 2021-10-06 DIAGNOSIS — N319 Neuromuscular dysfunction of bladder, unspecified: Secondary | ICD-10-CM | POA: Diagnosis present

## 2021-10-06 DIAGNOSIS — S069XAA Unspecified intracranial injury with loss of consciousness status unknown, initial encounter: Secondary | ICD-10-CM | POA: Diagnosis present

## 2021-10-06 DIAGNOSIS — Z87820 Personal history of traumatic brain injury: Secondary | ICD-10-CM

## 2021-10-06 DIAGNOSIS — F3178 Bipolar disorder, in full remission, most recent episode mixed: Secondary | ICD-10-CM | POA: Diagnosis present

## 2021-10-06 DIAGNOSIS — I959 Hypotension, unspecified: Secondary | ICD-10-CM

## 2021-10-06 DIAGNOSIS — I10 Essential (primary) hypertension: Secondary | ICD-10-CM | POA: Diagnosis present

## 2021-10-06 DIAGNOSIS — Z818 Family history of other mental and behavioral disorders: Secondary | ICD-10-CM

## 2021-10-06 DIAGNOSIS — B9561 Methicillin susceptible Staphylococcus aureus infection as the cause of diseases classified elsewhere: Secondary | ICD-10-CM | POA: Diagnosis present

## 2021-10-06 DIAGNOSIS — E66813 Obesity, class 3: Secondary | ICD-10-CM | POA: Diagnosis present

## 2021-10-06 DIAGNOSIS — N189 Chronic kidney disease, unspecified: Secondary | ICD-10-CM

## 2021-10-06 DIAGNOSIS — E11621 Type 2 diabetes mellitus with foot ulcer: Secondary | ICD-10-CM | POA: Diagnosis present

## 2021-10-06 DIAGNOSIS — L89159 Pressure ulcer of sacral region, unspecified stage: Secondary | ICD-10-CM | POA: Diagnosis present

## 2021-10-06 DIAGNOSIS — B965 Pseudomonas (aeruginosa) (mallei) (pseudomallei) as the cause of diseases classified elsewhere: Secondary | ICD-10-CM | POA: Diagnosis present

## 2021-10-06 DIAGNOSIS — M4628 Osteomyelitis of vertebra, sacral and sacrococcygeal region: Secondary | ICD-10-CM | POA: Diagnosis present

## 2021-10-06 DIAGNOSIS — N179 Acute kidney failure, unspecified: Secondary | ICD-10-CM

## 2021-10-06 DIAGNOSIS — E1140 Type 2 diabetes mellitus with diabetic neuropathy, unspecified: Secondary | ICD-10-CM | POA: Diagnosis present

## 2021-10-06 DIAGNOSIS — Z86718 Personal history of other venous thrombosis and embolism: Secondary | ICD-10-CM

## 2021-10-06 DIAGNOSIS — G4730 Sleep apnea, unspecified: Secondary | ICD-10-CM | POA: Diagnosis present

## 2021-10-06 DIAGNOSIS — G8220 Paraplegia, unspecified: Secondary | ICD-10-CM | POA: Diagnosis present

## 2021-10-06 DIAGNOSIS — I129 Hypertensive chronic kidney disease with stage 1 through stage 4 chronic kidney disease, or unspecified chronic kidney disease: Secondary | ICD-10-CM | POA: Diagnosis present

## 2021-10-06 DIAGNOSIS — Z6841 Body Mass Index (BMI) 40.0 and over, adult: Secondary | ICD-10-CM

## 2021-10-06 DIAGNOSIS — E785 Hyperlipidemia, unspecified: Secondary | ICD-10-CM | POA: Diagnosis present

## 2021-10-06 DIAGNOSIS — Z7984 Long term (current) use of oral hypoglycemic drugs: Secondary | ICD-10-CM

## 2021-10-06 DIAGNOSIS — R739 Hyperglycemia, unspecified: Secondary | ICD-10-CM

## 2021-10-06 DIAGNOSIS — M86371 Chronic multifocal osteomyelitis, right ankle and foot: Secondary | ICD-10-CM | POA: Diagnosis present

## 2021-10-06 DIAGNOSIS — R54 Age-related physical debility: Secondary | ICD-10-CM | POA: Diagnosis present

## 2021-10-06 DIAGNOSIS — E1169 Type 2 diabetes mellitus with other specified complication: Secondary | ICD-10-CM | POA: Diagnosis present

## 2021-10-06 DIAGNOSIS — E871 Hypo-osmolality and hyponatremia: Secondary | ICD-10-CM | POA: Diagnosis present

## 2021-10-06 DIAGNOSIS — D631 Anemia in chronic kidney disease: Secondary | ICD-10-CM | POA: Diagnosis present

## 2021-10-06 DIAGNOSIS — S31000A Unspecified open wound of lower back and pelvis without penetration into retroperitoneum, initial encounter: Secondary | ICD-10-CM | POA: Diagnosis present

## 2021-10-06 DIAGNOSIS — A419 Sepsis, unspecified organism: Secondary | ICD-10-CM | POA: Diagnosis present

## 2021-10-06 DIAGNOSIS — Z1624 Resistance to multiple antibiotics: Secondary | ICD-10-CM | POA: Diagnosis present

## 2021-10-06 DIAGNOSIS — R6510 Systemic inflammatory response syndrome (SIRS) of non-infectious origin without acute organ dysfunction: Secondary | ICD-10-CM

## 2021-10-06 DIAGNOSIS — G3184 Mild cognitive impairment, so stated: Secondary | ICD-10-CM | POA: Diagnosis present

## 2021-10-06 DIAGNOSIS — R6520 Severe sepsis without septic shock: Secondary | ICD-10-CM | POA: Diagnosis present

## 2021-10-06 DIAGNOSIS — Z933 Colostomy status: Secondary | ICD-10-CM

## 2021-10-06 DIAGNOSIS — L89154 Pressure ulcer of sacral region, stage 4: Secondary | ICD-10-CM | POA: Diagnosis present

## 2021-10-06 DIAGNOSIS — R140 Abdominal distension (gaseous): Secondary | ICD-10-CM

## 2021-10-06 DIAGNOSIS — Z978 Presence of other specified devices: Secondary | ICD-10-CM

## 2021-10-06 DIAGNOSIS — L89620 Pressure ulcer of left heel, unstageable: Secondary | ICD-10-CM | POA: Diagnosis present

## 2021-10-06 DIAGNOSIS — L89613 Pressure ulcer of right heel, stage 3: Secondary | ICD-10-CM | POA: Diagnosis present

## 2021-10-06 DIAGNOSIS — R4182 Altered mental status, unspecified: Secondary | ICD-10-CM | POA: Diagnosis not present

## 2021-10-06 DIAGNOSIS — Z20822 Contact with and (suspected) exposure to covid-19: Secondary | ICD-10-CM | POA: Diagnosis present

## 2021-10-06 DIAGNOSIS — R7881 Bacteremia: Secondary | ICD-10-CM | POA: Diagnosis present

## 2021-10-06 DIAGNOSIS — N1832 Chronic kidney disease, stage 3b: Secondary | ICD-10-CM | POA: Diagnosis present

## 2021-10-06 DIAGNOSIS — Z872 Personal history of diseases of the skin and subcutaneous tissue: Secondary | ICD-10-CM

## 2021-10-06 LAB — URINALYSIS, COMPLETE (UACMP) WITH MICROSCOPIC
Bilirubin Urine: NEGATIVE
Glucose, UA: NEGATIVE mg/dL
Ketones, ur: NEGATIVE mg/dL
Nitrite: NEGATIVE
Protein, ur: 100 mg/dL — AB
RBC / HPF: >50 RBC/hpf — ABNORMAL HIGH (ref 0–5)
Specific Gravity, Urine: 1.016 (ref 1.005–1.030)
Squamous Epithelial / HPF: NONE SEEN (ref 0–5)
WBC, UA: >50 WBC/hpf — ABNORMAL HIGH (ref 0–5)
pH: 6 (ref 5.0–8.0)

## 2021-10-06 LAB — CBC WITH DIFFERENTIAL/PLATELET
Abs Immature Granulocytes: 0.04 10*3/uL (ref 0.00–0.07)
Basophils Absolute: 0 10*3/uL (ref 0.0–0.1)
Basophils Relative: 0 %
Eosinophils Absolute: 0 10*3/uL (ref 0.0–0.5)
Eosinophils Relative: 0 %
HCT: 29.4 % — ABNORMAL LOW (ref 39.0–52.0)
Hemoglobin: 9.1 g/dL — ABNORMAL LOW (ref 13.0–17.0)
Immature Granulocytes: 0 %
Lymphocytes Relative: 10 %
Lymphs Abs: 0.9 10*3/uL (ref 0.7–4.0)
MCH: 26.4 pg (ref 26.0–34.0)
MCHC: 31 g/dL (ref 30.0–36.0)
MCV: 85.2 fL (ref 80.0–100.0)
Monocytes Absolute: 0.7 10*3/uL (ref 0.1–1.0)
Monocytes Relative: 8 %
Neutro Abs: 7.8 10*3/uL — ABNORMAL HIGH (ref 1.7–7.7)
Neutrophils Relative %: 82 %
Platelets: 200 10*3/uL (ref 150–400)
RBC: 3.45 MIL/uL — ABNORMAL LOW (ref 4.22–5.81)
RDW: 16.5 % — ABNORMAL HIGH (ref 11.5–15.5)
WBC: 9.6 10*3/uL (ref 4.0–10.5)
nRBC: 0 % (ref 0.0–0.2)

## 2021-10-06 LAB — RESP PANEL BY RT-PCR (FLU A&B, COVID) ARPGX2
Influenza A by PCR: NEGATIVE
Influenza B by PCR: NEGATIVE
SARS Coronavirus 2 by RT PCR: NEGATIVE

## 2021-10-06 LAB — COMPREHENSIVE METABOLIC PANEL
ALT: 11 U/L (ref 0–44)
AST: 15 U/L (ref 15–41)
Albumin: 2.8 g/dL — ABNORMAL LOW (ref 3.5–5.0)
Alkaline Phosphatase: 85 U/L (ref 38–126)
Anion gap: 7 (ref 5–15)
BUN: 25 mg/dL — ABNORMAL HIGH (ref 8–23)
CO2: 21 mmol/L — ABNORMAL LOW (ref 22–32)
Calcium: 8.3 mg/dL — ABNORMAL LOW (ref 8.9–10.3)
Chloride: 100 mmol/L (ref 98–111)
Creatinine, Ser: 1.82 mg/dL — ABNORMAL HIGH (ref 0.61–1.24)
GFR, Estimated: 40 mL/min — ABNORMAL LOW (ref >60)
Glucose, Bld: 215 mg/dL — ABNORMAL HIGH (ref 70–99)
Potassium: 4.7 mmol/L (ref 3.5–5.1)
Sodium: 128 mmol/L — ABNORMAL LOW (ref 135–145)
Total Bilirubin: 0.6 mg/dL (ref 0.3–1.2)
Total Protein: 8.1 g/dL (ref 6.5–8.1)

## 2021-10-06 LAB — PROTIME-INR
INR: 1.6 — ABNORMAL HIGH (ref 0.8–1.2)
Prothrombin Time: 18.8 seconds — ABNORMAL HIGH (ref 11.4–15.2)

## 2021-10-06 LAB — LACTIC ACID, PLASMA: Lactic Acid, Venous: 1.2 mmol/L (ref 0.5–1.9)

## 2021-10-06 LAB — APTT: aPTT: 41 seconds — ABNORMAL HIGH (ref 24–36)

## 2021-10-06 MED ORDER — VANCOMYCIN HCL IN DEXTROSE 1-5 GM/200ML-% IV SOLN
1000.0000 mg | INTRAVENOUS | Status: DC
  Filled 2021-10-06: qty 200

## 2021-10-06 MED ORDER — OMEGA-3-ACID ETHYL ESTERS 1 G PO CAPS
1.0000 | ORAL_CAPSULE | Freq: Every day | ORAL | Status: DC
  Administered 2021-10-07 – 2021-10-11 (×5): 1 g via ORAL
  Filled 2021-10-06 (×5): qty 1

## 2021-10-06 MED ORDER — LACTATED RINGERS IV BOLUS
1000.0000 mL | Freq: Once | INTRAVENOUS | Status: AC
  Administered 2021-10-06: 1000 mL via INTRAVENOUS

## 2021-10-06 MED ORDER — IOHEXOL 300 MG/ML  SOLN
100.0000 mL | Freq: Once | INTRAMUSCULAR | Status: AC | PRN
  Administered 2021-10-06: 100 mL via INTRAVENOUS

## 2021-10-06 MED ORDER — SODIUM CHLORIDE 0.9 % IV BOLUS (SEPSIS)
1000.0000 mL | Freq: Once | INTRAVENOUS | Status: AC
  Administered 2021-10-07: 1000 mL via INTRAVENOUS

## 2021-10-06 MED ORDER — OXCARBAZEPINE 300 MG PO TABS
300.0000 mg | ORAL_TABLET | Freq: Two times a day (BID) | ORAL | Status: DC
  Administered 2021-10-07 – 2021-10-11 (×9): 300 mg via ORAL
  Filled 2021-10-06 (×12): qty 1

## 2021-10-06 MED ORDER — HYDRALAZINE HCL 10 MG PO TABS
10.0000 mg | ORAL_TABLET | Freq: Four times a day (QID) | ORAL | Status: DC | PRN
  Filled 2021-10-06: qty 1

## 2021-10-06 MED ORDER — ACETAMINOPHEN 650 MG RE SUPP: 650.0000 mg | Freq: Four times a day (QID) | RECTAL | Status: DC | PRN

## 2021-10-06 MED ORDER — ACETAMINOPHEN 325 MG PO TABS
650.0000 mg | ORAL_TABLET | Freq: Four times a day (QID) | ORAL | Status: DC | PRN
Start: 2021-10-06 — End: 2021-10-11
  Administered 2021-10-07: 650 mg via ORAL
  Filled 2021-10-06: qty 2

## 2021-10-06 MED ORDER — MELATONIN 5 MG PO TABS: 5.0000 mg | ORAL_TABLET | Freq: Every evening | ORAL | Status: DC | PRN

## 2021-10-06 MED ORDER — VANCOMYCIN HCL 1500 MG/300ML IV SOLN
1500.0000 mg | Freq: Once | INTRAVENOUS | Status: AC
  Administered 2021-10-07: 1500 mg via INTRAVENOUS
  Filled 2021-10-06: qty 300

## 2021-10-06 MED ORDER — ZINC SULFATE 220 (50 ZN) MG PO CAPS
220.0000 mg | ORAL_CAPSULE | Freq: Every day | ORAL | Status: DC
  Administered 2021-10-07 – 2021-10-08 (×2): 220 mg via ORAL
  Filled 2021-10-06 (×2): qty 1

## 2021-10-06 MED ORDER — SODIUM CHLORIDE 0.9 % IV SOLN
2.0000 g | Freq: Two times a day (BID) | INTRAVENOUS | Status: DC
  Administered 2021-10-07: 2 g via INTRAVENOUS
  Filled 2021-10-06: qty 12.5
  Filled 2021-10-06: qty 2

## 2021-10-06 MED ORDER — ONDANSETRON HCL 4 MG PO TABS
4.0000 mg | ORAL_TABLET | Freq: Four times a day (QID) | ORAL | Status: DC | PRN
Start: 2021-10-06 — End: 2021-10-11

## 2021-10-06 MED ORDER — ONDANSETRON HCL 4 MG/2ML IJ SOLN
4.0000 mg | Freq: Four times a day (QID) | INTRAMUSCULAR | Status: DC | PRN
  Administered 2021-10-07: 4 mg via INTRAVENOUS
  Filled 2021-10-06: qty 2

## 2021-10-06 MED ORDER — METOPROLOL TARTRATE 5 MG/5ML IV SOLN: 5.0000 mg | INTRAVENOUS | Status: DC | PRN

## 2021-10-06 MED ORDER — ASCORBIC ACID 500 MG PO TABS
500.0000 mg | ORAL_TABLET | Freq: Every day | ORAL | Status: DC
  Administered 2021-10-07 – 2021-10-08 (×2): 500 mg via ORAL
  Filled 2021-10-06 (×2): qty 1

## 2021-10-06 MED ORDER — POLYETHYLENE GLYCOL 3350 17 G PO PACK
17.0000 g | PACK | Freq: Two times a day (BID) | ORAL | Status: DC | PRN
  Administered 2021-10-08: 17 g via ORAL
  Filled 2021-10-06: qty 1

## 2021-10-06 MED ORDER — QUETIAPINE FUMARATE 25 MG PO TABS: 25.0000 mg | ORAL_TABLET | Freq: Every day | ORAL | Status: DC | PRN

## 2021-10-06 MED ORDER — APIXABAN 5 MG PO TABS
5.0000 mg | ORAL_TABLET | Freq: Two times a day (BID) | ORAL | Status: DC
  Administered 2021-10-07 – 2021-10-11 (×10): 5 mg via ORAL
  Filled 2021-10-06 (×10): qty 1

## 2021-10-06 MED ORDER — VITAMIN B-12 1000 MCG PO TABS
3000.0000 ug | ORAL_TABLET | Freq: Every day | ORAL | Status: DC
  Administered 2021-10-07 – 2021-10-11 (×5): 3000 ug via ORAL
  Filled 2021-10-06 (×5): qty 3

## 2021-10-06 MED ORDER — MEMANTINE HCL 5 MG PO TABS
10.0000 mg | ORAL_TABLET | Freq: Two times a day (BID) | ORAL | Status: DC
  Administered 2021-10-07 – 2021-10-11 (×8): 10 mg via ORAL
  Filled 2021-10-06 (×8): qty 2

## 2021-10-06 MED ORDER — QUETIAPINE FUMARATE 25 MG PO TABS
100.0000 mg | ORAL_TABLET | Freq: Every day | ORAL | Status: DC
  Administered 2021-10-07 – 2021-10-10 (×5): 100 mg via ORAL
  Filled 2021-10-06 (×5): qty 4

## 2021-10-06 MED ORDER — ROSUVASTATIN CALCIUM 10 MG PO TABS
20.0000 mg | ORAL_TABLET | Freq: Every day | ORAL | Status: DC
  Administered 2021-10-07 – 2021-10-10 (×5): 20 mg via ORAL
  Filled 2021-10-06: qty 1
  Filled 2021-10-06 (×5): qty 2

## 2021-10-06 MED ORDER — CLONAZEPAM 0.25 MG PO TBDP: 0.2500 mg | ORAL_TABLET | Freq: Every day | ORAL | Status: DC | PRN

## 2021-10-06 MED ORDER — SODIUM CHLORIDE 0.9 % IV SOLN
2.0000 g | Freq: Once | INTRAVENOUS | Status: AC
  Administered 2021-10-06: 2 g via INTRAVENOUS
  Filled 2021-10-06: qty 12.5

## 2021-10-06 MED ORDER — AMLODIPINE BESYLATE 5 MG PO TABS
5.0000 mg | ORAL_TABLET | Freq: Every day | ORAL | Status: DC
  Administered 2021-10-07: 5 mg via ORAL
  Filled 2021-10-06 (×2): qty 1

## 2021-10-06 MED ORDER — VANCOMYCIN HCL IN DEXTROSE 1-5 GM/200ML-% IV SOLN
1000.0000 mg | Freq: Once | INTRAVENOUS | Status: AC
  Administered 2021-10-06: 1000 mg via INTRAVENOUS
  Filled 2021-10-06: qty 200

## 2021-10-06 MED ORDER — VITAMIN D3 25 MCG PO TABS: 1000.0000 [IU] | ORAL_TABLET | Freq: Every day | ORAL | Status: DC

## 2021-10-06 MED ORDER — VITAMIN D 25 MCG (1000 UNIT) PO TABS
1000.0000 [IU] | ORAL_TABLET | Freq: Every day | ORAL | Status: DC
  Administered 2021-10-07 – 2021-10-11 (×5): 1000 [IU] via ORAL
  Filled 2021-10-06 (×5): qty 1

## 2021-10-06 NOTE — Progress Notes (Signed)
Pharmacy Antibiotic Note ? ?[name] is a 66 y.o. male admitted on 10/06/2021 with suspected sepsis from unknown source.  Pharmacy has been consulted for Cefepime and Vancomycin dosing x 7 days. ? ?Plan: ?Cefepime 2 gm q12h per indication & renal fxn ? ?Vancomycin ?Pt ordered initial dose of Vanc 2500 mg ?Vancomycin 1000 mg IV Q 24 hrs.  ?Goal AUC 400-550. ?Expected AUC: 460.5 ?SCr used: 1.82, Vd: 0.5, BMI: 45 ? ?Pharmacy will continue to follow and will adjust abx dosing when warranted ? ?Height: 5\' 6"  (167.6 cm) ?Weight: 126.6 kg (279 lb) ?IBW/kg (Calculated) : 63.8 ? ?Temp (24hrs), Avg:100.5 ?F (38.1 ?C), Min:100.5 ?F (38.1 ?C), Max:100.5 ?F (38.1 ?C) ? ?Recent Labs  ?Lab 10/06/21 ?1956  ?WBC 9.6  ?CREATININE 1.82*  ?LATICACIDVEN 1.2  ?  ?Estimated Creatinine Clearance: 50.2 mL/min (A) (by C-G formula based on SCr of 1.82 mg/dL (H)).   ? ?No Known Allergies ? ?Antimicrobials this admission: ?4/08 Cefepime >> x 7 days ?4/08 Vancomycin >> x 7 days ? ?Microbiology results: ?4/08 BCx: Pending ?4/08 UCx: Pending  ? ?Thank you for allowing pharmacy to be a part of this patient?s care. ? ?6/08, PharmD, MBA ?10/06/2021 ?10:34 PM ? ? ?

## 2021-10-06 NOTE — ED Notes (Signed)
Patient transported to CT 

## 2021-10-06 NOTE — ED Triage Notes (Signed)
Pt BIBA for AMS, not acting right per spouse. Pt a/ox4, sometimes repetative. 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 Denies feeling bad, cough, UTI symptoms. LS clear ?

## 2021-10-06 NOTE — H&P (Addendum)
?History and Physical  ? ?[name] ZOX:[id number] DOB: [birth year] DOA: 10/06/2021 ? ?PCP: [name], MD  ?Outpatient Specialists: Dr. Diamantina Providence, urology ?Patient coming from: home ? ?I have personally briefly reviewed patient's old medical records in [place]. ? ?Chief Concern: Altered mental status ? ?HPI: [name] is a 66 year old male with multiple chronic medical diagnosis including chronic neurogenic bladder status post indwelling Foley catheter, chronic sacral wound, chronic bilateral heel wounds, bipolar disorder, chronic osteomyelitis of bilateral heel, history of DVT on anticoagulation with Eliquis, non-insulin-dependent diabetes mellitus, CKD 3B, chronic anemia, debility, history of sepsis secondary to UTI, who presents emergency department from home via EMS for chief concerns of altered mental status per family/partner. ? ?Initial vitals in the emergency department showed Tmax of 100.5, respiration rate of 22, heart rate of 108, blood pressure 133/79, SPO2 of 98% on room air, serum sodium 128, potassium 4.7, chloride 100, bicarb 21, BUN 25, serum creatinine of 1.82, GFR 40, nonfasting blood glucose 215, WBC 9.6, hemoglobin 9.1, platelets of 200. ? ?INR was 1.6, PT 18.8, PTT 41. ? ?COVID/influenza A/influenza B PCR were negative. ? ?UA showed large leukocytes. ? ?Blood cultures x2 and urine culture are in process. ? ?Portable chest x-ray: No active disease ? ?Left ankle: Degenerative changes and mild diffuse soft tissue swelling.  Small superficial soft tissue ulcer along the left heel without evidence of acute osteomyelitis.  MRI recommended if osteomyelitis remains clinical concern. ? ?Right ankle: Mild diffuse soft tissue swelling without any acute osseous abnormality.  Chronic and degenerative changes. ? ?ED treatment: Cefepime 2 g IV, vancomycin per pharmacy, LR 1 L bolus. ? ?At bedside patient is able to tell me his full name, his age, his current location, and  current calendar year.   ? ?He reports that he is in the emergency department because he was not feeling well and that he had a fever, Tmax of 101 at home.  He reports that his spouse/partner gave him Tylenol which reduces fever a little bit.  His symptoms were nonspecific, he endorses just generalized malaise.  He reports this started on day of admission. ? ?He denies any new cough, chest pain, known sick contacts, nausea, vomiting, abdominal pain, diarrhea. ? ?He reports he is currently feeling better now. ? ?Per spouse patient has been 'talking out of his head'.  He kept telling his spouse that he was going to fall.  Spouse also reports that his lips looked pale and very dry.  She did not touch his forehead and she noticed it was hot. ? ?Per spouse, chronic indwelling Foley catheter was last changed about 1 week ago. ? ?Social history: Lives at home with his wife.  He denies history of tobacco, EtOH, recreational drug use. ? ?Vaccination history: He endorses being vaccinated for COVID-19 and influenza ? ?ROS: ?Constitutional: no weight change, + fever ?ENT/Mouth: no sore throat, no rhinorrhea ?Eyes: no eye pain, no vision changes ?Cardiovascular: no chest pain, no dyspnea,  no edema, no palpitations ?Respiratory: no cough, no sputum, no wheezing ?Gastrointestinal: no nausea, no vomiting, no diarrhea, no constipation ?Genitourinary: no urinary incontinence, no dysuria, no hematuria ?Musculoskeletal: no arthralgias, + myalgias ?Skin: no skin lesions, no pruritus, ?Neuro: + weakness, no loss of consciousness, no syncope ?Psych: no anxiety, no depression, + decrease appetite ?Heme/Lymph: no bruising, no bleeding ? ?ED Course: Discussed with emergency medicine provider, patient requiring hospitalization for chief concerns of SIRS/sepsis. ? ?Assessment/Plan ? ?Principal Problem: ?  Altered mental status ?Active Problems: ?  Chronic anticoagulation - on Eliquis ?  Hyperlipidemia ?  Hypertension ?  Sleep apnea ?  GAD  (generalized anxiety disorder) ?  Bipolar disorder, in full remission, most recent episode mixed ([place]) ?  Obesity, Class III, BMI 40-49.9 (morbid obesity) ([place]) ?  Urinary retention ?  History of DVT (deep vein thrombosis) ?  Stage 3b chronic kidney disease (CKD) (HCC) - baseline SCr 1.8-1.9 ?  Hyponatremia ?  Chronic multifocal osteomyelitis of right foot ([place]) ?  Anemia ?  Ulcer of right heel and midfoot with fat layer exposed ([place]) ?  Chronic indwelling Foley catheter ?  TBI (traumatic brain injury) ([place]) ?  SIRS (systemic inflammatory response syndrome) (HCC) ?  Sacral wound ?  ?Assessment and Plan: ? ?* Altered mental status ?- Etiology work-up in progress, initial differential includes sepsis in setting of multiple comorbidities including chronic indwelling Foley catheter ?- Treat per SIRS/sepsis ? ?Sacral wound ?- Present admission ?- Wound nursing consulted ?- Patient was too difficult for me to move for myself and nursing staff was busy with very sick patient ? ?SIRS (systemic inflammatory response syndrome) (HCC) ?- Met SIRS criteria with fever, elevated heart rate, possible source of urine however patient has chronic indwelling Foley catheter, positive leukocytes may be secondary to chronic colonizer ?- Blood cultures x2 are in process, urine culture in process ?- Check procalcitonin ?- Sepsis not ruled out at this time ?- Continue with broad-spectrum antibiotic including vancomycin and cefepime ?- Status post lactated ringer 1 L bolus per EDP ?- Ordered additional sodium chloride 1 L bolus and patient maintaining appropriate MAP (>65) at this time ? ?Ulcer of right heel and midfoot with fat layer exposed ([place]) ?- Wound care consult ? ?Anemia ?- Appears chronic and on admission appears to be at baseline ?- Hemoglobin range 8.2-9.9 ? ?Hyponatremia ?- Corrected serum sodium level is 131, Evan ? ?Stage 3b chronic kidney disease (CKD) (HCC) - baseline SCr 1.8-1.9 ?- Serum creatinine on presentation is  1.82/eGFR 40 ?- Serum creatinine range in the last 3 months has been 1.66-2.15/eGFR 33-45 ? ?History of DVT (deep vein thrombosis) ?- Eliquis 5 mg p.o. twice daily resumed ? ?Urinary retention ?- Status post chronic indwelling Foley catheter ?- Per urology note on 08/09/2021: Patient has history of neurogenic bladder since early 2000's and reportedly due to urethral stricture.  Formally patient managed his bladder with self-catheterization 3-4 times per day, however since his health has declined over the last 20 months, he currently has been requiring indwelling Foley catheter and changed monthly by home health nursing. ? ?Bipolar disorder, in full remission, most recent episode mixed ([place]) ?- Resumed home quetiapine 100 mg nightly ?- Resumed home quetiapine 25-50 mg tablet, p.o., daily as needed for agitation ?- Oxcarbamazepine 300 mg p.o. twice daily resumed ? ?GAD (generalized anxiety disorder) ?- Resumed home clonazepam 0.25 to 0.5 mg p.o. tablets daily as needed for severe anxiety agitation ? ?Hypertension ?- Resumed amlodipine 5 mg daily for 10/07/2021 ?- Hydralazine 10 mg p.o. every 6 hours as needed for SBP greater than 180, 4 days ordered ? ?Hyperlipidemia ?- Resumed rosuvastatin 20 mg nightly ? ?Chronic anticoagulation - on Eliquis ?-Prior home medication Eliquis twice daily resumed ? ?Chart reviewed.  ? ?DVT prophylaxis: Home Eliquis ?Code Status: full code ?Diet: heart/carb modified ?Family Communication: Updated spouse, [name] over the phone ?Disposition Plan: Pending clinical course ?Consults called: None at this time ?Admission status: Telemetry cardiac, observation ? ?Past Medical History:  ?Diagnosis Date  ? Bipolar  disorder ([place])   ? Diabetes mellitus without complication ([place])   ? History of blood clots   ? Hypertension   ? TBI (traumatic brain injury) ([place])   ? ?Past Surgical History:  ?Procedure Laterality Date  ? BACK SURGERY    ? CARPAL TUNNEL RELEASE Bilateral   ? COLON SURGERY    ? COLOSTOMY     ? TONSILLECTOMY    ? ?Social History:  reports that he has never smoked. He has never been exposed to tobacco smoke. He has never used smokeless tobacco. He reports current alcohol use. He reports that

## 2021-10-06 NOTE — ED Provider Notes (Signed)
? ?[facility] ?Provider Note ? ? ? Event Date/Time  ? First MD Initiated Contact with Patient 10/06/21 1949   ?  (approximate) ? ? ?History  ? ?Altered Mental Status ? ? ?HPI ? ?[name] is a 66 y.o. male   with medical history significant for bipolar disorder, type 2 diabetes mellitus, hypertension and traumatic brain injury, as well as stage IV sacral decubitus ulcer and chronic osteomyelitis hospitalized earlier this year in January for sepsis secondary to UTI from chronic indwelling Foley catheter anticoagulated on Eliquis who presents EMS from home for evaluation of some increased confusion and spots reportedly noticed earlier today.  Patient states he is not sure why he needs to the emergency room.  He is oriented x4.  Endorses a little of cough and shortness of breath but denies any abdominal pain falls or injuries, headache, vomiting, diarrhea, chest pain or any other acute sick symptoms.  He denies EtOH use illicit drug use or tobacco abuse.  States he takes all his medicines as directed. ? ?  ? ? ?Physical Exam  ?Triage Vital Signs: ?ED Triage Vitals [10/06/21 1951]  ?Enc Vitals Group  ?   BP 133/79  ?   Pulse Rate (!) 108  ?   Resp (!) 22  ?   Temp (!) 100.5 ?F (38.1 ?C)  ?   Temp Source Oral  ?   SpO2 98 %  ?   Weight   ?   Height   ?   Head Circumference   ?   Peak Flow   ?   Pain Score   ?   Pain Loc   ?   Pain Edu?   ?   Excl. in [place]?   ? ? ?Most recent vital signs: ?Vitals:  ? 10/06/21 2100 10/06/21 2130  ?BP: 115/65 112/62  ?Pulse: 93 87  ?Resp: 12 17  ?Temp:    ?SpO2: 97% 95%  ? ? ?General: Awake, no distress.  ?CV:  Slightly prolonged capillary refill.  Patient is tachycardic on arrival.  2+ radial pulse.  No significant audible murmur. ?Resp:  Normal effort.  Clear bilaterally. ?Abd:  No distention.  Colostomy bag is in place.  Slightly distended but soft. ?Other:  Bilateral sacral wounds with clear margins but some black appearing tissue more in the left heel  than the right.  No significant surrounding skin changes. ? ? ?ED Results / Procedures / Treatments  ?Labs ?(all labs ordered are listed, but only abnormal results are displayed) ?Labs Reviewed  ?COMPREHENSIVE METABOLIC PANEL - Abnormal; Notable for the following components:  ?    Result Value  ? Sodium 128 (*)   ? CO2 21 (*)   ? Glucose, Bld 215 (*)   ? BUN 25 (*)   ? Creatinine, Ser 1.82 (*)   ? Calcium 8.3 (*)   ? Albumin 2.8 (*)   ? GFR, Estimated 40 (*)   ? All other components within normal limits  ?CBC WITH DIFFERENTIAL/PLATELET - Abnormal; Notable for the following components:  ? RBC 3.45 (*)   ? Hemoglobin 9.1 (*)   ? HCT 29.4 (*)   ? RDW 16.5 (*)   ? Neutro Abs 7.8 (*)   ? All other components within normal limits  ?PROTIME-INR - Abnormal; Notable for the following components:  ? Prothrombin Time 18.8 (*)   ? INR 1.6 (*)   ? All other components within normal limits  ?APTT - Abnormal; Notable for the following  components:  ? aPTT 41 (*)   ? All other components within normal limits  ?URINALYSIS, COMPLETE (UACMP) WITH MICROSCOPIC - Abnormal; Notable for the following components:  ? Color, Urine YELLOW (*)   ? APPearance CLOUDY (*)   ? Hgb urine dipstick MODERATE (*)   ? Protein, ur 100 (*)   ? Leukocytes,Ua LARGE (*)   ? RBC / HPF >50 (*)   ? WBC, UA >50 (*)   ? Bacteria, UA MANY (*)   ? All other components within normal limits  ?RESP PANEL BY RT-PCR (FLU A&B, COVID) [id number]  ?CULTURE, BLOOD (ROUTINE X 2)  ?CULTURE, BLOOD (ROUTINE X 2)  ?URINE CULTURE  ?LACTIC ACID, PLASMA  ?LACTIC ACID, PLASMA  ?MAGNESIUM  ?PHOSPHORUS  ?CBC WITH DIFFERENTIAL/PLATELET  ?CORTISOL-AM, BLOOD  ?PROCALCITONIN  ?BASIC METABOLIC PANEL  ? ? ? ?EKG ? ?ECG is remarkable for sinus tachycardia with a ventricular rate of 103, normal axis, unremarkable intervals with isolated nonspecific change in lead III without other clear evidence of acute ischemia or significant arrhythmia. ? ? ?RADIOLOGY ? ?CT head interpreted by myself without  evidence of ischemia, edema, mass effect hemorrhage or other clear acute process.  I also reviewed radiologist interpretation. ? ?Chest reviewed by myself shows no focal consoidation, effusion, edema, pneumothorax or other clear acute thoracic process. I also reviewed radiology interpretation and agree with findings described. ? ?CT of the abdomen pelvis on my interpretation shows no evidence of diverticulitis, abscess or other clear process but show patient's known sacral decubitus wound.  I also reviewed radiology interpretation and agree with their findings of diffuse thick-walled appearance of the urinary bladder and deep sacral wound without any fluid collections and some chronic erosive changes of the sacrococcygeal region without significant change from 7 to 10-22.  There is also notation of left abdominal colostomy bag in place without evidence of obstruction. ? ?Plain films of the left and right heel reviewed by myself shows some degenerative changes without clear evidence of acute osteomyelitis.  I also reviewed radiologist interpretation and agree with their findings. ? ?PROCEDURES: ? ?Critical Care performed: Yes, see critical care procedure note(s) ? ?.Critical Care ?Performed by: Gilles Chiquito, MD ?Authorized by: Gilles Chiquito, MD  ? ?Critical care provider statement:  ?  Critical care time (minutes):  30 ?  Critical care was necessary to treat or prevent imminent or life-threatening deterioration of the following conditions:  Sepsis ?  Critical care was time spent personally by me on the following activities:  Development of treatment plan with patient or surrogate, discussions with consultants, evaluation of patient's response to treatment, examination of patient, ordering and review of laboratory studies, ordering and review of radiographic studies, ordering and performing treatments and interventions, pulse oximetry, re-evaluation of patient's condition and review of old  charts ? ? ? ?MEDICATIONS ORDERED IN ED: ?Medications  ?vancomycin (VANCOCIN) IVPB 1000 mg/200 mL premix (1,000 mg Intravenous New Bag/Given 10/06/21 2145)  ?  Followed by  ?vancomycin (VANCOREADY) IVPB 1500 mg/300 mL (has no administration in time range)  ?rosuvastatin (CRESTOR) tablet 20 mg (has no administration in time range)  ?amLODipine (NORVASC) tablet 5 mg (has no administration in time range)  ?memantine (NAMENDA) tablet 10 mg (has no administration in time range)  ?QUEtiapine (SEROQUEL) tablet 100 mg (has no administration in time range)  ?QUEtiapine (SEROQUEL) tablet 25-50 mg (has no administration in time range)  ?apixaban (ELIQUIS) tablet 5 mg (has no administration in time range)  ?vitamin B-12 (CYANOCOBALAMIN) tablet  3,000 mcg (has no administration in time range)  ?clonazePAM (KLONOPIN) tablet 0.25-0.5 mg (has no administration in time range)  ?Oxcarbazepine (TRILEPTAL) tablet 300 mg (has no administration in time range)  ?ascorbic acid (VITAMIN C) tablet 500 mg (has no administration in time range)  ?Vitamin D3 (Vitamin D) tablet 1,000 Units (has no administration in time range)  ?Omega-3 CAPS 1,000 mg (has no administration in time range)  ?zinc sulfate capsule 220 mg (has no administration in time range)  ?acetaminophen (TYLENOL) tablet 650 mg (has no administration in time range)  ?  Or  ?acetaminophen (TYLENOL) suppository 650 mg (has no administration in time range)  ?ondansetron (ZOFRAN) tablet 4 mg (has no administration in time range)  ?  Or  ?ondansetron (ZOFRAN) injection 4 mg (has no administration in time range)  ?sodium chloride 0.9 % bolus 1,000 mL (has no administration in time range)  ?polyethylene glycol (MIRALAX / GLYCOLAX) packet 17 g (has no administration in time range)  ?iohexol (OMNIPAQUE) 300 MG/ML solution 100 mL (100 mLs Intravenous Contrast Given 10/06/21 2110)  ?lactated ringers bolus 1,000 mL (1,000 mLs Intravenous New Bag/Given 10/06/21 2144)  ?ceFEPIme (MAXIPIME) 2 g in  sodium chloride 0.9 % 100 mL IVPB (2 g Intravenous New Bag/Given 10/06/21 2140)  ? ? ? ?IMPRESSION / MDM / ASSESSMENT AND PLAN / ED COURSE  ?I reviewed the triage vital signs and the nursing notes. ?             ?               ? ?

## 2021-10-06 NOTE — Hospital Course (Addendum)
[name] is a 66 y.o. M with Bipolar, DM, sedentary due to neuropathy, now with chronic stage IV decub with osteomyelitis, neurogenic bladder with indwelling foley, hx DVT on Eliuqis, chronic bilateral heel wounds with bilateral heel osteomyelitis, CKD IIIb, anemia and recurrent UTI who presented with with confusion for one day noticed by a family member "talking out of his head." ? ?In the ER, Febrile, tachycardic and tachypneic.  Altered.  Unclear source.  EDP suspected flare of one of his osteomyelitis, vs UTI.    ? ? ?4/8: Admitted on antibiotics ?4/10: Blood cultures with MSSA in 2/2 overnight; ID consulted; Cr up to 2.7 ?4/11: BP low, increasing fluids ?

## 2021-10-06 NOTE — Assessment & Plan Note (Addendum)
-   Continue Eliquis 

## 2021-10-06 NOTE — Progress Notes (Addendum)
PHARMACY -  BRIEF ANTIBIOTIC NOTE  ? ?Pharmacy has received consult(s) for Cefepime and Vancomycin from an ED provider.  The patient's profile has been reviewed for ht/wt/allergies/indication/available labs.   ? ?One time order(s) placed for Cefepime 2 gm and Vancomycin 2500 mg per pt wt > 100 kg. ? ?Further antibiotics/pharmacy consults should be ordered by admitting physician if indicated.       ?                ?Thank you, ?Otelia Sergeant, PharmD, MBA ?10/06/2021 ?9:34 PM ? ?

## 2021-10-06 NOTE — ED Notes (Addendum)
Pt BIBA for not acting right, fever. A/ox4 but appears tired/lethargic, denies feeling bad. Repeatative at times. Pt has bilateral heel wounds and sacral wounds, per EMS, "for 20 years states wife".  ?

## 2021-10-06 NOTE — Assessment & Plan Note (Addendum)
Hgb trending down.  Baseline range 8.2-9.9 ?No clinical bleeding ?-Continue B12 supplement ?

## 2021-10-06 NOTE — Assessment & Plan Note (Signed)
-   Etiology work-up in progress, initial differential includes sepsis in setting of multiple comorbidities including chronic indwelling Foley catheter ?- Treat per SIRS/sepsis ?

## 2021-10-06 NOTE — Assessment & Plan Note (Addendum)
BP soft - Hold amlodipine 

## 2021-10-06 NOTE — Assessment & Plan Note (Signed)
-   Resumed rosuvastatin 20 mg nightly 

## 2021-10-06 NOTE — Assessment & Plan Note (Addendum)
Patient presented with fever, tachycardia, tachypnea, decreased mentation and confusion. ? ?Blood cultures growing MSSA in 2/2 on admission. ?Echo without significant vegetation ?-Continue cefazolin ?- Consult ID ?- Given suspected osteo and plan for 6 weeks Abx, will defer TEE ? ?- Repeat cultures ?- Monitor for satellite sites of infection ? ? ? ? ?

## 2021-10-06 NOTE — Assessment & Plan Note (Addendum)
-   Continue quetiapine, oxcarbazepine  ?

## 2021-10-06 NOTE — Assessment & Plan Note (Addendum)
-   Avoid clonazepam given encphealopathy ?

## 2021-10-06 NOTE — Assessment & Plan Note (Addendum)
Chronic.  [name] is around 89 ?

## 2021-10-06 NOTE — Assessment & Plan Note (Addendum)
Per urology note on 08/09/2021: Patient has history of neurogenic bladder since early 2000's and reportedly due to urethral stricture.  Formally patient managed his bladder with self-catheterization 3-4 times per day, however since his health has declined over the last 20 months, he currently has been requiring indwelling Foley catheter and changed monthly by home health nursing. ?

## 2021-10-06 NOTE — Assessment & Plan Note (Addendum)
Creatinine within baseline range 1.66-2.15.  See above re: AKI ?

## 2021-10-06 NOTE — Sepsis Progress Note (Signed)
Elink following code sepsis °

## 2021-10-07 ENCOUNTER — Inpatient Hospital Stay: Payer: Medicare Other

## 2021-10-07 ENCOUNTER — Inpatient Hospital Stay
Admit: 2021-10-07 | Discharge: 2021-10-07 | Disposition: A | Discharge status: Home / Self Care | Payer: Medicare Other | Attending: Family Medicine | Admitting: Family Medicine

## 2021-10-07 DIAGNOSIS — M8660 Other chronic osteomyelitis, unspecified site: Secondary | ICD-10-CM | POA: Diagnosis not present

## 2021-10-07 DIAGNOSIS — D638 Anemia in other chronic diseases classified elsewhere: Secondary | ICD-10-CM

## 2021-10-07 DIAGNOSIS — Z1624 Resistance to multiple antibiotics: Secondary | ICD-10-CM | POA: Diagnosis present

## 2021-10-07 DIAGNOSIS — N179 Acute kidney failure, unspecified: Secondary | ICD-10-CM | POA: Diagnosis present

## 2021-10-07 DIAGNOSIS — F3178 Bipolar disorder, in full remission, most recent episode mixed: Secondary | ICD-10-CM

## 2021-10-07 DIAGNOSIS — E1122 Type 2 diabetes mellitus with diabetic chronic kidney disease: Secondary | ICD-10-CM | POA: Diagnosis present

## 2021-10-07 DIAGNOSIS — L97412 Non-pressure chronic ulcer of right heel and midfoot with fat layer exposed: Secondary | ICD-10-CM | POA: Diagnosis present

## 2021-10-07 DIAGNOSIS — G3184 Mild cognitive impairment, so stated: Secondary | ICD-10-CM | POA: Diagnosis present

## 2021-10-07 DIAGNOSIS — A419 Sepsis, unspecified organism: Secondary | ICD-10-CM

## 2021-10-07 DIAGNOSIS — A4101 Sepsis due to Methicillin susceptible Staphylococcus aureus: Secondary | ICD-10-CM | POA: Diagnosis present

## 2021-10-07 DIAGNOSIS — Z20822 Contact with and (suspected) exposure to covid-19: Secondary | ICD-10-CM | POA: Diagnosis present

## 2021-10-07 DIAGNOSIS — L89159 Pressure ulcer of sacral region, unspecified stage: Secondary | ICD-10-CM | POA: Diagnosis present

## 2021-10-07 DIAGNOSIS — L89613 Pressure ulcer of right heel, stage 3: Secondary | ICD-10-CM | POA: Diagnosis present

## 2021-10-07 DIAGNOSIS — E871 Hypo-osmolality and hyponatremia: Secondary | ICD-10-CM | POA: Diagnosis present

## 2021-10-07 DIAGNOSIS — M4628 Osteomyelitis of vertebra, sacral and sacrococcygeal region: Secondary | ICD-10-CM | POA: Diagnosis present

## 2021-10-07 DIAGNOSIS — N1832 Chronic kidney disease, stage 3b: Secondary | ICD-10-CM | POA: Diagnosis present

## 2021-10-07 DIAGNOSIS — Z978 Presence of other specified devices: Secondary | ICD-10-CM

## 2021-10-07 DIAGNOSIS — R6520 Severe sepsis without septic shock: Secondary | ICD-10-CM | POA: Diagnosis present

## 2021-10-07 DIAGNOSIS — D631 Anemia in chronic kidney disease: Secondary | ICD-10-CM | POA: Diagnosis present

## 2021-10-07 DIAGNOSIS — L89620 Pressure ulcer of left heel, unstageable: Secondary | ICD-10-CM | POA: Diagnosis present

## 2021-10-07 DIAGNOSIS — F411 Generalized anxiety disorder: Secondary | ICD-10-CM | POA: Diagnosis present

## 2021-10-07 DIAGNOSIS — Z872 Personal history of diseases of the skin and subcutaneous tissue: Secondary | ICD-10-CM | POA: Diagnosis not present

## 2021-10-07 DIAGNOSIS — R7881 Bacteremia: Secondary | ICD-10-CM | POA: Diagnosis not present

## 2021-10-07 DIAGNOSIS — R4182 Altered mental status, unspecified: Secondary | ICD-10-CM | POA: Diagnosis present

## 2021-10-07 DIAGNOSIS — M86371 Chronic multifocal osteomyelitis, right ankle and foot: Secondary | ICD-10-CM | POA: Diagnosis present

## 2021-10-07 DIAGNOSIS — I129 Hypertensive chronic kidney disease with stage 1 through stage 4 chronic kidney disease, or unspecified chronic kidney disease: Secondary | ICD-10-CM | POA: Diagnosis present

## 2021-10-07 DIAGNOSIS — S31000A Unspecified open wound of lower back and pelvis without penetration into retroperitoneum, initial encounter: Secondary | ICD-10-CM | POA: Diagnosis present

## 2021-10-07 DIAGNOSIS — E1140 Type 2 diabetes mellitus with diabetic neuropathy, unspecified: Secondary | ICD-10-CM | POA: Diagnosis present

## 2021-10-07 DIAGNOSIS — G8220 Paraplegia, unspecified: Secondary | ICD-10-CM | POA: Diagnosis present

## 2021-10-07 DIAGNOSIS — E1165 Type 2 diabetes mellitus with hyperglycemia: Secondary | ICD-10-CM | POA: Diagnosis present

## 2021-10-07 DIAGNOSIS — G9341 Metabolic encephalopathy: Secondary | ICD-10-CM

## 2021-10-07 DIAGNOSIS — B9561 Methicillin susceptible Staphylococcus aureus infection as the cause of diseases classified elsewhere: Secondary | ICD-10-CM | POA: Diagnosis present

## 2021-10-07 DIAGNOSIS — Z6841 Body Mass Index (BMI) 40.0 and over, adult: Secondary | ICD-10-CM | POA: Diagnosis not present

## 2021-10-07 DIAGNOSIS — Z7901 Long term (current) use of anticoagulants: Secondary | ICD-10-CM

## 2021-10-07 DIAGNOSIS — L89154 Pressure ulcer of sacral region, stage 4: Secondary | ICD-10-CM | POA: Diagnosis present

## 2021-10-07 LAB — BLOOD CULTURE ID PANEL (REFLEXED) - BCID2

## 2021-10-07 LAB — CBC WITH DIFFERENTIAL/PLATELET
Abs Immature Granulocytes: 0.07 10*3/uL (ref 0.00–0.07)
Basophils Absolute: 0 10*3/uL (ref 0.0–0.1)
Basophils Relative: 0 %
Eosinophils Absolute: 0 10*3/uL (ref 0.0–0.5)
Eosinophils Relative: 0 %
HCT: 29.1 % — ABNORMAL LOW (ref 39.0–52.0)
Hemoglobin: 9.1 g/dL — ABNORMAL LOW (ref 13.0–17.0)
Immature Granulocytes: 1 %
Lymphocytes Relative: 6 %
Lymphs Abs: 0.6 10*3/uL — ABNORMAL LOW (ref 0.7–4.0)
MCH: 26.4 pg (ref 26.0–34.0)
MCHC: 31.3 g/dL (ref 30.0–36.0)
MCV: 84.3 fL (ref 80.0–100.0)
Monocytes Absolute: 0.8 10*3/uL (ref 0.1–1.0)
Monocytes Relative: 9 %
Neutro Abs: 7.8 10*3/uL — ABNORMAL HIGH (ref 1.7–7.7)
Neutrophils Relative %: 84 %
Platelets: 173 10*3/uL (ref 150–400)
RBC: 3.45 MIL/uL — ABNORMAL LOW (ref 4.22–5.81)
RDW: 16.5 % — ABNORMAL HIGH (ref 11.5–15.5)
WBC: 9.3 10*3/uL (ref 4.0–10.5)
nRBC: 0 % (ref 0.0–0.2)

## 2021-10-07 LAB — PHOSPHORUS: Phosphorus: 1.9 mg/dL — ABNORMAL LOW (ref 2.5–4.6)

## 2021-10-07 LAB — ECHOCARDIOGRAM COMPLETE
AV Peak grad: 4 mmHg
Ao pk vel: 1 m/s
Area-P 1/2: 3.72 cm2
Height: 66 in
S' Lateral: 3.42 cm
Weight: 4464 oz

## 2021-10-07 LAB — BASIC METABOLIC PANEL
Anion gap: 5 (ref 5–15)
BUN: 24 mg/dL — ABNORMAL HIGH (ref 8–23)
CO2: 22 mmol/L (ref 22–32)
Calcium: 8.1 mg/dL — ABNORMAL LOW (ref 8.9–10.3)
Chloride: 101 mmol/L (ref 98–111)
Creatinine, Ser: 1.86 mg/dL — ABNORMAL HIGH (ref 0.61–1.24)
GFR, Estimated: 39 mL/min — ABNORMAL LOW (ref >60)
Glucose, Bld: 193 mg/dL — ABNORMAL HIGH (ref 70–99)
Potassium: 4.9 mmol/L (ref 3.5–5.1)
Sodium: 128 mmol/L — ABNORMAL LOW (ref 135–145)

## 2021-10-07 LAB — CORTISOL-AM, BLOOD: Cortisol - AM: 25.3 ug/dL — ABNORMAL HIGH (ref 6.7–22.6)

## 2021-10-07 LAB — MAGNESIUM: Magnesium: 1.5 mg/dL — ABNORMAL LOW (ref 1.7–2.4)

## 2021-10-07 LAB — LACTIC ACID, PLASMA: Lactic Acid, Venous: 1.5 mmol/L (ref 0.5–1.9)

## 2021-10-07 LAB — PROCALCITONIN: Procalcitonin: 0.62 ng/mL

## 2021-10-07 MED ORDER — DAKINS (1/4 STRENGTH) 0.125 % EX SOLN
Freq: Two times a day (BID) | CUTANEOUS | Status: DC
  Filled 2021-10-07: qty 473

## 2021-10-07 MED ORDER — SODIUM CHLORIDE 0.9 % IV BOLUS
500.0000 mL | Freq: Once | INTRAVENOUS | Status: AC
  Administered 2021-10-07: 500 mL via INTRAVENOUS

## 2021-10-07 MED ORDER — CHLORHEXIDINE GLUCONATE CLOTH 2 % EX PADS
6.0000 | MEDICATED_PAD | Freq: Every day | CUTANEOUS | Status: DC
  Administered 2021-10-09 – 2021-10-11 (×3): 6 via TOPICAL

## 2021-10-07 MED ORDER — CEFAZOLIN SODIUM-DEXTROSE 2-4 GM/100ML-% IV SOLN
2.0000 g | Freq: Three times a day (TID) | INTRAVENOUS | Status: DC
  Administered 2021-10-07 – 2021-10-11 (×12): 2 g via INTRAVENOUS
  Filled 2021-10-07 (×14): qty 100

## 2021-10-07 NOTE — Assessment & Plan Note (Signed)
Chronic. 

## 2021-10-07 NOTE — Consult Note (Signed)
PHARMACY - PHYSICIAN COMMUNICATION ?CRITICAL VALUE ALERT - BLOOD CULTURE IDENTIFICATION (BCID) ? ?[name] is an 66 y.o. male who presented to [facility] on 10/06/2021 with a chief complaint of AMS ? ?Assessment:  1 out of 4 bottles growing GPC. BCID postive for staph aureus.  Source unclear, urine versus infected sacral ulcer/osteo ? ?Name of physician (or Provider) Contacted: [place] ? ?Current antibiotics: cefepime and vancoycin ? ?Changes to prescribed antibiotics recommended: Switch to ancef.  ?Recommendations accepted by provider ? ?Results for orders placed or performed during the hospital encounter of 10/06/21  ?Blood Culture ID Panel (Reflexed) (Collected: 10/06/2021  8:01 PM)  ?Result Value Ref Range  ? Enterococcus faecalis NOT DETECTED NOT DETECTED  ? Enterococcus Faecium NOT DETECTED NOT DETECTED  ? Listeria monocytogenes NOT DETECTED NOT DETECTED  ? Staphylococcus species DETECTED (A) NOT DETECTED  ? Staphylococcus aureus (BCID) DETECTED (A) NOT DETECTED  ? Staphylococcus epidermidis NOT DETECTED NOT DETECTED  ? Staphylococcus lugdunensis NOT DETECTED NOT DETECTED  ? Streptococcus species NOT DETECTED NOT DETECTED  ? Streptococcus agalactiae NOT DETECTED NOT DETECTED  ? Streptococcus pneumoniae NOT DETECTED NOT DETECTED  ? Streptococcus pyogenes NOT DETECTED NOT DETECTED  ? A.calcoaceticus-baumannii NOT DETECTED NOT DETECTED  ? Bacteroides fragilis NOT DETECTED NOT DETECTED  ? Enterobacterales NOT DETECTED NOT DETECTED  ? Enterobacter cloacae complex NOT DETECTED NOT DETECTED  ? Escherichia coli NOT DETECTED NOT DETECTED  ? Klebsiella aerogenes NOT DETECTED NOT DETECTED  ? Klebsiella oxytoca NOT DETECTED NOT DETECTED  ? Klebsiella pneumoniae NOT DETECTED NOT DETECTED  ? Proteus species NOT DETECTED NOT DETECTED  ? Salmonella species NOT DETECTED NOT DETECTED  ? Serratia marcescens NOT DETECTED NOT DETECTED  ? Haemophilus influenzae NOT DETECTED NOT DETECTED  ? Neisseria meningitidis NOT  DETECTED NOT DETECTED  ? Pseudomonas aeruginosa NOT DETECTED NOT DETECTED  ? Stenotrophomonas maltophilia NOT DETECTED NOT DETECTED  ? Candida albicans NOT DETECTED NOT DETECTED  ? Candida auris NOT DETECTED NOT DETECTED  ? Candida glabrata NOT DETECTED NOT DETECTED  ? Candida krusei NOT DETECTED NOT DETECTED  ? Candida parapsilosis NOT DETECTED NOT DETECTED  ? Candida tropicalis NOT DETECTED NOT DETECTED  ? Cryptococcus neoformans/gattii NOT DETECTED NOT DETECTED  ? Meth resistant mecA/C and MREJ NOT DETECTED NOT DETECTED  ? ? ?Ronnald Ramp ?10/07/2021  12:27 PM ? ?

## 2021-10-07 NOTE — Progress Notes (Signed)
?Progress Note ? ? ?PatientGodofredo [name] [id number] DOB: 26-Jun-1956 DOA: 10/06/2021     0 ?DOS: the patient was seen and examined on 10/07/2021 at 9:33 AM ?  ? ? ? ?Brief hospital course: ?Mr. Pustejovsky is a 66 y.o. 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 ?In the ER, Febrile, tachycardic and tachypneic.  Altered.  Unclear source.  EDP suspected flare of one of his osteomyelitis, vs UTI.    ? ? ? ? ?Assessment and Plan: ?* Severe sepsis ([place]) ?Patient presented with fever, tachycardia, tachypnea, decreased mentation and confusion. ? ?Source unclear, urine versus infected sacral ulcer/osteo. ?- Continue vancomycin and cefepime ?- Follow blood cultures ?  ? ?Acute metabolic encephalopathy ?Patient at baseline has mild cognitive impairment, but oriented and appropriate, but on presentation he was confused and talking out of his head.  This appears to be resolved.  Likely cause was infection ? ?Sacral wound ?- Present admission ?- Wound nursing consulted ? ? ?TBI (traumatic brain injury) ([place]) ?Chronic. ? ?Chronic indwelling Foley catheter ?  ? ?Ulcer of right heel and midfoot with fat layer exposed ([place]) ?- Wound care consult ? ?Anemia of chronic disease ?Hemoglobin appears to be stable on his baseline range 8.2-9.9 ? ?Chronic multifocal osteomyelitis of right foot ([place]) ?  ? ?Hyponatremia ?Chronic.  [name] is around 130 ? ?Stage 3b chronic kidney disease (CKD) (HCC) - baseline SCr 1.8-1.9 ?Creatinine within baseline range 1.66-2.15  ? ?History of DVT (deep vein thrombosis) ?- Continue Eliquis ? ?Urinary retention ?Per urology note on 08/09/2021: Patient has history of neurogenic bladder since  early 2000's and reportedly due to urethral stricture.  Formally patient managed his bladder with self-catheterization 3-4 times per day, however since his health has declined over the last 20 months, he currently has been requiring indwelling Foley catheter and changed monthly by home health nursing. ? ?Obesity, Class III, BMI 40-49.9 (morbid obesity) ([place]) ?BMI 45 ? ?Bipolar disorder, in full remission, most recent episode mixed ([place]) ?- Continue quetiapine, oxcarbazepine  ? ?GAD (generalized anxiety disorder) ?As needed Klonopin ? ?Hypertension ?Blood pressure controlled ?- Continue amlodipine  ? ?Hyperlipidemia ?- Resumed rosuvastatin 20 mg nightly ? ?Chronic anticoagulation - on Eliquis ?- Continue Eliquis ? ?Altered mental status-resolved as of 10/07/2021 ?- Etiology work-up in progress, initial differential includes sepsis in setting of multiple comorbidities including chronic indwelling Foley catheter ?- Treat per SIRS/sepsis ? ? ? ? ? ? ? ? ? ?Subjective: The patient started to feel better.  He has no fever, his mentation is improving.  He has no chest pain, abdominal pain.  He has no increase in pain in his sacral wound or his heels. ? ? ? ? ?Physical Exam: ?Vitals:  ? 10/07/21 0249 10/07/21 0350 10/07/21 0804 10/07/21 1100  ?BP: (!) 169/78 135/68 (!) 101/57 130/62  ?Pulse: 98 95 94 89  ?Resp: (!) 24 (!) 24 (!) 24 (!) 21  ?Temp: 99.1 ?F (37.3 ?C) 98.8 ?F (37.1 ?C) (!) 100.7 ?F (38.2 ?C) (!) 101.5 ?F (38.6 ?C)  ?TempSrc: Oral Oral Oral Oral  ?SpO2: 98% 95% 94% 98%  ?Weight:      ?Height:      ? ?Elderly, chronically ill-appearing adult male, lying in bed, appears  debilitated.  Makes eye contact, appropriate and interactive. ?RRR, no murmurs, no peripheral edema ?Lungs clear without rales or wheezes, but lung sounds diminished ?Abdomen soft without tenderness palpation in all quadrants ?There are bilateral heels with some eschar overlying them, without surrounding cellulitis in the bilateral heels ?The sacral  wound is not yet been evaluated, awaiting nursing ? ?Data Reviewed: ?Nursing notes reviewed, vital signs reviewed. ?Sodium 128, close to baseline ?Creatinine 1.8, at baseline ?Hemoglobin 9, stable ?Urinalysis suggestive of infection ?CT of the abdomen and pelvis as described in EDP note with thickening of the bladder, no other acute finding ? ?Family Communication:   ? ? ? ?Disposition: ?Status is: INPATIENT ?The patient was admitted for what appears to be severe sepsis.  He will require ongoing IV antibiotics and evaluation for source of infection ? ?Likely to SNF in 2 to 3 days ? ? ? ? ? ? ? ?Author: ?Edwin Dada, MD ?10/07/2021 11:53 AM ? ?For on call review [email or web address].  ? ? ?

## 2021-10-07 NOTE — Progress Notes (Signed)
Ordered an air loss mattress from portable.  Awaiting arrival to unit.  Placed prevalon boots.   ?

## 2021-10-07 NOTE — Care Plan (Signed)
Called to wife, no answer.  Left VM. ?

## 2021-10-07 NOTE — Progress Notes (Signed)
? ?      CROSS COVER NOTE ? ?NAME: [name] ?MRN: [id number] ?DOB : 1956-03-04 ? ? ? ?Date of Service ?  10/07/2021  ?HPI/Events of Note ?  Secure chat received from nursing reporting Temp 102.62F, HR 120, and RR 22 as well as emesis that is new for patient, that was initially described as projectile vomiting but on further questioning does not sound like projectile vomiting.  ? ?Mr [name] was admitted 10/06/2021 with altered mental status and currently being treated for sepsis secondary to sacral ulcer vs UTI. PMH Bipolar, DM, paraplegia, chronic sacral wound w osteomyelitis, neurogenic bladder with chronic foley, bilateral chronic heel woulds with osteomyelitis, hx of DVT on Eliquis, CKD-3B, anemia, and recurrent UTIs.  ? ?On bedside evaluation Mr [name] is alert, not diaphoretic, denies dyspnea, palpitations, chest pain, dizziness, abdominal pain, headache, or vision changes. No CN-VI palsy noted on exam, no other clinical signs of increased ICP noted. He has active bowel sounds and there is output in his ostomy bag. Pt does report emesis occurred shortly after him eating a snack. ? ?  ?Interventions ?  Plan: ?Acetaminophen as ordered ?500 mL NS bolus ?Abd Xray ?Continue Cefazolin as ordered ?Will keep on progressive overnight ?   ?  ? ?Bishop Limbo MHA, MSN, FNP-BC ?Nurse Practitioner ?[facility] ?[place] ?Pager [phone] ? ?

## 2021-10-07 NOTE — Progress Notes (Signed)
Pharmacy Antibiotic Note ? ?[name] is a 66 y.o. male w/ PMH of  bipolar disorder, DM, HTN, TBI admitted on 10/06/2021 with sepsis. 1 out of 4 bottles growing GPC. BCID positive for staph aureus.  Source unclear, urine versus infected sacral ulcer/osteo. Pharmacy has been consulted for cefazolin dosing. Renal function near previously identified baseline level.  ? ?Plan:  start cefazolin 2 grams IV every 8 hours ? ?Pharmacy will continue to follow and will adjust abx dosing when warranted ? ?Height: 5\' 6"  (167.6 cm) ?Weight: 126.6 kg (279 lb) ?IBW/kg (Calculated) : 63.8 ? ?Temp (24hrs), Avg:100.1 ?F (37.8 ?C), Min:98.8 ?F (37.1 ?C), Max:101.5 ?F (38.6 ?C) ? ?Recent Labs  ?Lab 10/06/21 ?1956 10/07/21 ?0502  ?WBC 9.6 9.3  ?CREATININE 1.82* 1.86*  ?LATICACIDVEN 1.2 1.5  ? ?  ?Estimated Creatinine Clearance: 49.1 mL/min (A) (by C-G formula based on SCr of 1.86 mg/dL (H)).   ? ?No Known Allergies ? ?Antimicrobials this admission: ?4/08 cefepime >> 04/09 ?4/08 vancomycin >> 04/09 ?4/09 cefazolin >> ? ?Microbiology results: ?4/08 BCx:  1 out of 4 bottles growing GPC. BCID postive for staph aureus.  ?4/08 UCx: Pending  ? ?Thank you for allowing pharmacy to be a part of this patient?s care. ? ?6/08, PharmD, BCPS ?10/07/2021 ?1:01 PM ? ? ?

## 2021-10-07 NOTE — Consult Note (Signed)
New positive blood culture in 1/4 bottle with staph aureus and BCID with MSSA.   ?Has been changed to cefazolin. ?TTE ordered. ?Dr. Rivka Safer on tomorrow for full consult. ?Gardiner Barefoot, MD  ?

## 2021-10-07 NOTE — Assessment & Plan Note (Signed)
BMI 45 

## 2021-10-07 NOTE — Consult Note (Signed)
WOC Nurse Consult Note: ?Patient well known to our department from previous admissions. 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 ?Wound bed: Deep red (sacral), red and yellow (right heel). Left heel not seen. ?Drainage (amount, consistency, odor)  ?Periwound:with evidence of previous wound contracture, scarring ?Dressing procedure/placement/frequency: I will provide a mattress replacement with low air loss feature today and bilateral pressure redistribution heel boots. Topical care guidance is provided for Nursing using xeroform antimicrobial nonadherent gauze to the bilateral heels topped with dry gauze and secured with Kerlix roll gauze and performed daily. The sacral wound will be treated with twice daily sodium hypochlorite solution moistened gauze dressings topped with dry gauze, and an ABD pad secured with tape. ? ? 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 2 and 3/4 inch ostomy pouching system with skin barrier ring ?Education provided: None today ?Enrolled patient in [place] program: No, patient is established with a provider. ? ?[place] nursing team will not follow, but will remain available to this patient, the nursing and medical teams.  Please re-consult if needed. ?Thanks, ?Maudie Flakes, MSN, RN, [place], [place], CWON-AP, [place]  ?Pager# [phone]  ?

## 2021-10-07 NOTE — Progress Notes (Signed)
*  PRELIMINARY RESULTS* ?Echocardiogram ?2D Echocardiogram has been performed. ? ?[name] ?10/07/2021, 2:49 PM ?

## 2021-10-07 NOTE — Assessment & Plan Note (Addendum)
Stage IV, present on admission.  Does not appear infected.  Defer surgical consultation to ID. ? ?

## 2021-10-07 NOTE — Assessment & Plan Note (Addendum)
See below re: urinary retention  ?

## 2021-10-07 NOTE — Assessment & Plan Note (Addendum)
Patient at baseline has mild cognitive impairment, but oriented and appropriate.  On presentation he was confused and talking out of his head.  This appears to be resolved.  Likely cause was infection ?

## 2021-10-07 NOTE — Assessment & Plan Note (Addendum)
With osteo. ?

## 2021-10-08 ENCOUNTER — Inpatient Hospital Stay: Payer: Medicare Other

## 2021-10-08 ENCOUNTER — Ambulatory Visit: Payer: Medicare Other | Admitting: Licensed Clinical Social Worker

## 2021-10-08 DIAGNOSIS — G9341 Metabolic encephalopathy: Secondary | ICD-10-CM | POA: Diagnosis not present

## 2021-10-08 DIAGNOSIS — D638 Anemia in other chronic diseases classified elsewhere: Secondary | ICD-10-CM | POA: Diagnosis not present

## 2021-10-08 DIAGNOSIS — M8660 Other chronic osteomyelitis, unspecified site: Secondary | ICD-10-CM

## 2021-10-08 DIAGNOSIS — A419 Sepsis, unspecified organism: Secondary | ICD-10-CM | POA: Diagnosis not present

## 2021-10-08 DIAGNOSIS — N179 Acute kidney failure, unspecified: Secondary | ICD-10-CM

## 2021-10-08 DIAGNOSIS — R140 Abdominal distension (gaseous): Secondary | ICD-10-CM

## 2021-10-08 LAB — CBC
HCT: 25.3 % — ABNORMAL LOW (ref 39.0–52.0)
Hemoglobin: 8.1 g/dL — ABNORMAL LOW (ref 13.0–17.0)
MCH: 27.1 pg (ref 26.0–34.0)
MCHC: 32 g/dL (ref 30.0–36.0)
MCV: 84.6 fL (ref 80.0–100.0)
Platelets: 165 10*3/uL (ref 150–400)
RBC: 2.99 MIL/uL — ABNORMAL LOW (ref 4.22–5.81)
RDW: 16.7 % — ABNORMAL HIGH (ref 11.5–15.5)
WBC: 13.8 10*3/uL — ABNORMAL HIGH (ref 4.0–10.5)
nRBC: 0 % (ref 0.0–0.2)

## 2021-10-08 LAB — BASIC METABOLIC PANEL
Anion gap: 8 (ref 5–15)
BUN: 26 mg/dL — ABNORMAL HIGH (ref 8–23)
CO2: 19 mmol/L — ABNORMAL LOW (ref 22–32)
Calcium: 8.1 mg/dL — ABNORMAL LOW (ref 8.9–10.3)
Chloride: 102 mmol/L (ref 98–111)
Creatinine, Ser: 2.72 mg/dL — ABNORMAL HIGH (ref 0.61–1.24)
GFR, Estimated: 25 mL/min — ABNORMAL LOW (ref >60)
Glucose, Bld: 160 mg/dL — ABNORMAL HIGH (ref 70–99)
Potassium: 4.4 mmol/L (ref 3.5–5.1)
Sodium: 129 mmol/L — ABNORMAL LOW (ref 135–145)

## 2021-10-08 MED ORDER — MAGNESIUM SULFATE 2 GM/50ML IV SOLN
2.0000 g | Freq: Once | INTRAVENOUS | Status: AC
  Administered 2021-10-08: 2 g via INTRAVENOUS
  Filled 2021-10-08: qty 50

## 2021-10-08 MED ORDER — ZINC SULFATE 220 (50 ZN) MG PO CAPS
220.0000 mg | ORAL_CAPSULE | Freq: Every day | ORAL | Status: DC
  Administered 2021-10-09 – 2021-10-11 (×3): 220 mg via ORAL
  Filled 2021-10-08 (×3): qty 1

## 2021-10-08 MED ORDER — QUETIAPINE FUMARATE 25 MG PO TABS: 25.0000 mg | ORAL_TABLET | Freq: Every day | ORAL | Status: DC | PRN

## 2021-10-08 MED ORDER — GLUCERNA SHAKE PO LIQD
237.0000 mL | Freq: Two times a day (BID) | ORAL | Status: DC
Start: 2021-10-08 — End: 2021-10-11
  Administered 2021-10-08 – 2021-10-11 (×6): 237 mL via ORAL

## 2021-10-08 MED ORDER — ASCORBIC ACID 500 MG PO TABS
500.0000 mg | ORAL_TABLET | Freq: Two times a day (BID) | ORAL | Status: DC
  Administered 2021-10-08 – 2021-10-11 (×6): 500 mg via ORAL
  Filled 2021-10-08 (×6): qty 1

## 2021-10-08 MED ORDER — K PHOS MONO-SOD PHOS DI & MONO 155-852-130 MG PO TABS
500.0000 mg | ORAL_TABLET | ORAL | Status: AC
  Administered 2021-10-08 (×2): 500 mg via ORAL
  Filled 2021-10-08 (×3): qty 2

## 2021-10-08 MED ORDER — SODIUM CHLORIDE 0.9 % IV SOLN: INTRAVENOUS | Status: DC

## 2021-10-08 MED ORDER — ENSURE MAX PROTEIN PO LIQD
11.0000 [oz_av] | Freq: Every day | ORAL | Status: DC
  Administered 2021-10-08 – 2021-10-10 (×3): 11 [oz_av] via ORAL
  Filled 2021-10-08: qty 330

## 2021-10-08 MED ORDER — ADULT MULTIVITAMIN W/MINERALS CH
1.0000 | ORAL_TABLET | Freq: Every day | ORAL | Status: DC
Start: 2021-10-08 — End: 2021-10-11
  Administered 2021-10-08 – 2021-10-11 (×4): 1 via ORAL
  Filled 2021-10-08 (×4): qty 1

## 2021-10-08 NOTE — Progress Notes (Signed)
Pt refused breakfast d/t not being hungry. Ate a few bites at noon. ?

## 2021-10-08 NOTE — Progress Notes (Signed)
Initial Nutrition Assessment ? ?DOCUMENTATION CODES:  ? ?Morbid obesity ? ?INTERVENTION:  ? ?-500 mg vitamin C BID ?-220 mg zinc sulfate x 14 days ?-Glucerna Shake po BID, each supplement provides 220 kcal and 10 grams of protein  ?-Ensure Max po daily, each supplement provides 150 kcal and 30 grams of protein ?-MVI with minerals daily ?-Liberalize diet to carb modified for wider variety of meal selections ? ?NUTRITION DIAGNOSIS:  ? ?Increased nutrient needs related to wound healing as evidenced by estimated needs. ? ?GOAL:  ? ?Patient will meet greater than or equal to 90% of their needs ? ?MONITOR:  ? ?PO intake, Supplement acceptance, Diet advancement, Labs, Weight trends, Skin, I & O's ? ?REASON FOR ASSESSMENT:  ? ?Low [place] ?  ? ?ASSESSMENT:  ? ?Mr. Alvern Ille is a 66 year old male with multiple chronic medical diagnosis including chronic neurogenic bladder status post indwelling Foley catheter, chronic sacral wound, chronic bilateral heel wounds, bipolar disorder, chronic osteomyelitis of bilateral heel, history of DVT on anticoagulation with Eliquis, non-insulin-dependent diabetes mellitus, CKD 3B, chronic anemia, debility, history of sepsis secondary to UTI, who presents emergency department from home via EMS for chief concerns of altered mental status per family/partner. ? ?Pt admitted with AMS.  ? ?Reviewed I/O's: -132 ml x 24 hours and +840 ml since admission ? ?UOP: 675 ml x 24 hours ? ?Colostomy output: 275 ml x 24 hours  ? ?Spoke with pt at bedside, who kept his eyes closed for the majority of visit. Noted meal tray in front of him; untouched. Pt shares he does not have an appetite. PTA he reports consuming 3 meals per day (Breakfast: bacon, eggs, and waffles; Lunch and Dinner: meat, starch, and vegetable).  ? ?Pt shares that home health assists with dressing changes. He also reports taking "medications" for wound healing, however, does not know what they are.  ? ?Pt denies any weight loss.  Wt has been stable over the past 6 months.  ? ?Discussed importance of good meal and supplement intake ti promote healing. Pt amenable to supplements.  ? ?Per TOC notes, plan for SNF placement.  ? ?Medications reviewed and include vitamin D3, lovaza, phosphorus, zinc sulfate, and vitamin B-12.  ? ?Lab Results  ?Component Value Date  ? HGBA1C 7.3 (H) 06/22/2021  ? PTA DM medications are none.  ? ?Labs reviewed: Na: 129, Phos: 1.9 (on PO supplementation), Mg: 1.5, CBGS: 170 (inpatient orders for glycemic control are none).   ? ?NUTRITION - FOCUSED PHYSICAL EXAM: ? ?Flowsheet Row Most Recent Value  ?Orbital Region No depletion  ?Upper Arm Region No depletion  ?Thoracic and Lumbar Region No depletion  ?Buccal Region No depletion  ?Temple Region No depletion  ?Clavicle Bone Region No depletion  ?Clavicle and Acromion Bone Region No depletion  ?Scapular Bone Region No depletion  ?Dorsal Hand No depletion  ?Patellar Region No depletion  ?Anterior Thigh Region No depletion  ?Posterior Calf Region No depletion  ?Edema (RD Assessment) Mild  ?Hair Reviewed  ?Eyes Reviewed  ?Mouth Reviewed  ?Skin Reviewed  ?Nails Reviewed  ? ?  ? ? ?Diet Order:   ?Diet Order   ? ?       ?  Diet heart healthy/carb modified Room service appropriate? Yes; Fluid consistency: Thin  Diet effective now       ?  ? ?  ?  ? ?  ? ? ?EDUCATION NEEDS:  ? ?Education needs have been addressed ? ?Skin:  Skin Assessment: Skin Integrity Issues: ?Skin  Integrity Issues:: Stage IV, Stage III, Unstageable ?Stage III: rt heel ?Stage IV: sacrum ?Unstageable: lt heel ? ?Last BM:  10/08/21 ? ?Height:  ? ?Ht Readings from Last 1 Encounters:  ?10/06/21 5\' 6"  (1.676 m)  ? ? ?Weight:  ? ?Wt Readings from Last 1 Encounters:  ?10/06/21 126.6 kg  ? ? ?Ideal Body Weight:  64.5 kg ? ?BMI:  Body mass index is 45.03 kg/m?. ? ?Estimated Nutritional Needs:  ? ?Kcal:  2050-2250 ? ?Protein:  110-125 grams ? ?Fluid:  > 2 L ? ? ? ?Loistine Chance, RD, LDN, CDCES ?Registered Dietitian  II ?Certified Diabetes Care and Education Specialist ?Please refer to [facility] for RD and/or RD on-call/weekend/after hours pager  ?

## 2021-10-08 NOTE — Consult Note (Signed)
NAME: [name]  ?DOB: 11-12-1955  ?MRN: [id number]  ?Date/Time: 10/08/2021 12:40 PM ? ?REQUESTING PROVIDER: Dr.Danford ?Subjective:  ?REASON FOR CONSULT: MSSA bacteremia ??pt is alomited historian, chart reviewed ?[name] is a 66 y.o. 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 HR 108, BP 133/79 ?N 128, K 4.7, cr 1.82, WBC 9.6, HB 9.1, PLT 200 abd glucose 215 ?CT head no acute findings, Cxr No acute findings ?Blood culture sent and started on vanco and cefepime ?Blood culture positive for MSSA and I am seeing the patient for the same ? ?Past Medical History:  ?Diagnosis Date  ? Bipolar disorder ([place])   ? Diabetes mellitus without complication ([place])   ? History of blood clots   ? Hypertension   ? TBI (traumatic brain injury) ([place])   ?  ?Past Surgical History:  ?Procedure Laterality Date  ? BACK SURGERY    ? CARPAL TUNNEL RELEASE Bilateral   ? COLON SURGERY    ? COLOSTOMY    ? TONSILLECTOMY    ?  ?Social History  ? ?Socioeconomic History  ? Marital status: Married  ?  Spouse name: thelma  ? Number of children: 0  ? Years of education: Not on file  ? Highest education level: Associate degree: occupational, Hotel manager, or vocational program  ?Occupational History  ? Not on file  ?Tobacco Use  ? Smoking status: Never  ?  Passive exposure: Never  ? Smokeless tobacco: Never  ?Vaping Use  ? Vaping Use: Never used  ?Substance and Sexual Activity  ? Alcohol use: Yes  ? Drug use: No  ? Sexual activity: Not on file  ?Other Topics Concern  ? Not on file  ?Social History Narrative  ? Not on file  ? ?Social Determinants of Health   ? ?Financial Resource Strain: Not on file  ?Food Insecurity: Not on file  ?Transportation Needs: Not on file  ?Physical Activity: Not on file  ?Stress: Not on file  ?Social Connections: Not on file  ?Intimate Partner Violence: Not on file  ?  ?Family History  ?Problem Relation Age of Onset  ? Depression Father   ? Deep vein thrombosis Father   ? ?No Known Allergies ? ?Current Facility-Administered Medications  ?Medication Dose Route Frequency Provider Last Rate Last Admin  ? acetaminophen (TYLENOL) tablet 650 mg  650 mg Oral Q6H PRN Cox, Amy N, DO   650 mg at 10/07/21 1935  ? Or  ? acetaminophen (TYLENOL) suppository 650 mg  650 mg Rectal Q6H PRN Cox, Amy N, DO      ? apixaban (ELIQUIS) tablet 5 mg  5 mg Oral BID Cox, Amy N, DO   5 mg at 10/08/21 1142  ? ascorbic acid (VITAMIN C) tablet 500 mg  500 mg Oral Daily Cox, Amy N, DO   500 mg at 10/08/21 1142  ? ceFAZolin (ANCEF) IVPB 2g/100 mL premix  2 g Intravenous Q8H Danford, Suann Larry, MD 200 mL/hr at 10/08/21 0642 2 g at 10/08/21 [id number]  ? Chlorhexidine Gluconate Cloth 2 % PADS 6 each  6 each Topical Daily Danford, Suann Larry, MD      ?  cholecalciferol (VITAMIN D3) tablet 1,000 Units  1,000 Units Oral Daily Renda Rolls, RPH   1,000 Units at 10/08/21 1142  ? clonazePAM (KLONOPIN) disintegrating tablet 0.25-0.5 mg  0.25-0.5 mg Oral Daily PRN Cox, Amy N, DO      ? hydrALAZINE (APRESOLINE) tablet 10 mg  10 mg Oral Q6H PRN Cox, Amy N, DO      ? magnesium sulfate IVPB 2 g 50 mL  2 g Intravenous Once Danford, Suann Larry, MD      ? melatonin tablet 5 mg  5 mg Oral QHS PRN Cox, Amy N, DO      ? memantine (NAMENDA) tablet 10 mg  10 mg Oral BID Cox, Amy N, DO   10 mg at 10/08/21 1142  ? omega-3 acid ethyl esters (LOVAZA) capsule 1 g  1 capsule Oral Daily Cox, Amy N, DO   1 g at 10/08/21 1143  ? ondansetron (ZOFRAN) tablet 4 mg  4 mg Oral Q6H PRN Cox, Amy N, DO      ? Or  ? ondansetron (ZOFRAN) injection 4 mg  4 mg Intravenous Q6H PRN Cox, Amy N, DO   4 mg at  10/07/21 2006  ? Oxcarbazepine (TRILEPTAL) tablet 300 mg  300 mg Oral BID Cox, Amy N, DO   300 mg at 10/08/21 1142  ? phosphorus (K PHOS NEUTRAL) tablet 500 mg  500 mg Oral Q4H Danford, Suann Larry, MD      ? polyethylene glycol (MIRALAX / GLYCOLAX) packet 17 g  17 g Oral BID PRN Cox, Amy N, DO   17 g at 10/08/21 1142  ? QUEtiapine (SEROQUEL) tablet 100 mg  100 mg Oral QHS Cox, Amy N, DO   100 mg at 10/07/21 2253  ? QUEtiapine (SEROQUEL) tablet 25 mg  25 mg Oral Daily PRN Oswald Hillock, RPH      ? rosuvastatin (CRESTOR) tablet 20 mg  20 mg Oral QHS Cox, Amy N, DO   20 mg at 10/07/21 2253  ? sodium hypochlorite (DAKIN'S 1/4 STRENGTH) topical solution   Topical BID Edwin Dada, MD   Given at 10/08/21 1144  ? vitamin B-12 (CYANOCOBALAMIN) tablet 3,000 mcg  3,000 mcg Oral Daily Cox, Amy N, DO   3,000 mcg at 10/08/21 1142  ? zinc sulfate capsule 220 mg  220 mg Oral Daily Cox, Amy N, DO   220 mg at 10/08/21 1142  ?  ? ?Abtx:  ?Anti-infectives (From admission, onward)  ? ? Start     Dose/Rate Route Frequency Ordered Stop  ? 10/07/21 2200  ceFAZolin (ANCEF) IVPB 2g/100 mL premix       ? 2 g ?200 mL/hr over 30 Minutes Intravenous Every 8 hours 10/07/21 1226    ? 10/07/21 2100  vancomycin (VANCOCIN) IVPB 1000 mg/200 mL premix  Status:  Discontinued       ? 1,000 mg ?200 mL/hr over 60 Minutes Intravenous Every 24 hours 10/06/21 2239 10/07/21 1226  ? 10/07/21 0900  ceFEPIme (MAXIPIME) 2 g in sodium chloride 0.9 % 100 mL IVPB  Status:  Discontinued       ? 2 g ?200 mL/hr over 30 Minutes Intravenous Every 12 hours 10/06/21 2237 10/07/21 1226  ? 10/06/21 2145  ceFEPIme (MAXIPIME) 2 g in sodium chloride 0.9 % 100 mL IVPB       ? 2 g ?200 mL/hr over 30 Minutes Intravenous  Once 10/06/21 2132 10/06/21 2237  ? 10/06/21 2145  vancomycin (VANCOCIN) IVPB 1000 mg/200 mL  premix       ?See Hyperspace for full Linked Orders Report.  ? 1,000 mg ?200 mL/hr over 60 Minutes Intravenous  Once 10/06/21 2132 10/07/21 0222  ? 10/06/21  2145  vancomycin (VANCOREADY) IVPB 1500 mg/300 mL       ?See Hyperspace for full Linked Orders Report.  ? 1,500 mg ?150 mL/hr over 120 Minutes Intravenous  Once 10/06/21 2132 10/07/21 0254  ? ?  ? ? ?REVIEW OF SYSTEMS:  ?Const: negative fever, negative chills, negative weight loss ?Eyes: negative diplopia or visual changes, negative eye pain ?ENT: negative coryza, negative sore throat ?Resp: negative cough, hemoptysis, dyspnea ?Cards: negative for chest pain, palpitations, lower extremity edema ?GU: has foley ?GI: Negative for abdominal pain, diarrhea, bleeding, constipation ?Skin: sacral wound ?Heme: negative for easy bruising and gum/nose bleeding ?MS: general weakness ?Neurolo:negative for headaches, dizziness, vertigo, memory problems  ?Psych: anxiety ?Endocrine:  diabetes ?Allergy/Immunology- NKDA ?Objective:  ?VITALS:  ?BP 118/68 (BP Location: Right Arm)   Pulse 88   Temp 98.2 ?F (36.8 ?C) (Oral)   Resp 20   Ht 5\' 6"  (1.676 m)   Wt 126.6 kg   SpO2 100%   BMI 45.03 kg/m?  ?PHYSICAL EXAM:  ?General: Alert, cooperative, no distress, oriented x5 ?Head: Normocephalic, without obvious abnormality, atraumatic. ?Eyes: Conjunctivae clear, anicteric sclerae. Pupils are equal ?ENT Nares normal. No drainage or sinus tenderness. ?Lips, mucosa, and tongue normal. No Thrush ?Neck: Supple, symmetrical, no adenopathy, thyroid: non tender ?no carotid bruit and no JVD. ?Back: stage IV sacral decubitus- clean base ?Lungs: Clear to auscultation bilaterally. No Wheezing or Rhonchi. No rales. ?Heart: Regular rate and rhythm, no murmur, rub or gallop. ?Abdomen: Soft, colostomy ?Extremities: rt heel superficial scab ?Left heel- unstageable- eschar present ?Skin: No rashes or lesions. Or bruising ?Lymph: Cervical, supraclavicular normal. ?Neurologic: weakness legs ?Pertinent Labs ?Lab Results ?CBC ?   ?Component Value Date/Time  ? WBC 13.8 (H) 10/08/2021 0449  ? RBC 2.99 (L) 10/08/2021 0449  ? HGB 8.1 (L) 10/08/2021 0449  ? HCT  25.3 (L) 10/08/2021 0449  ? PLT 165 10/08/2021 0449  ? MCV 84.6 10/08/2021 0449  ? MCH 27.1 10/08/2021 0449  ? MCHC 32.0 10/08/2021 0449  ? RDW 16.7 (H) 10/08/2021 0449  ? LYMPHSABS 0.6 (L) 10/07/2021 0502  ? MONOA

## 2021-10-08 NOTE — Assessment & Plan Note (Addendum)
Cr 1.8 on admission, up to 2.7 yesterday ?Notable for CT with contrast on 4/8 and some hypotension last 48 hours ? ?Cr today improved slightly 2.3 ?- Continue IV fluids ?- Obtain renal US ?- Avoid further nephrotoxins ? ?

## 2021-10-08 NOTE — Assessment & Plan Note (Addendum)
No pain.  Patient denies complaints but abdomen more distended today.   ?Abd x-ray last night without abnormal bowel gas pattern. ?- If persistent, will obtain US abdomen ?

## 2021-10-08 NOTE — Progress Notes (Signed)
2029: Reached out to on-call provider regarding fever of 102.5, tachy, and projectile vomiting x2 after Tylenol was given. This RN was at bedside and pt vomited a large amount of clear fluid forcefully across room hitting computer on opposite wall. Zofran given. Pt denies ABD pain, active bowel sounds present, and there is output in colostomy. Received orders.  ?

## 2021-10-08 NOTE — [place] FL2 (Signed)
?[place] MEDICAID FL2 LEVEL OF CARE SCREENING TOOL  ?  ? ?IDENTIFICATION  ?Patient Name: ?[name] Birthdate: May 25, 1956 Sex: male Admission Date (Current Location): ?10/06/2021  ?[place] and [place] Number: ? [place] ?  Facility and Address:  ?[facility], [address], [place], [place] [address] ?     Provider Number: ?[id number]  ?Attending Physician Name and Address:  ?Edwin Dada, * ? Relative Name and Phone Number:  ?  ?   ?Current Level of Care: ?Hospital Recommended Level of Care: ?[place] Prior Approval Number: ?  ? ?Date Approved/Denied: ?  PASRR Number: ?[id number] ? ?Discharge Plan: ?SNF ?  ? ?Current Diagnoses: ?Patient Active Problem List  ? Diagnosis Date Noted  ? Sacral wound 10/07/2021  ? MSSA bacteremia 10/07/2021  ? Severe sepsis ([place]) 10/06/2021  ? Sepsis secondary to UTI ([place]) 07/28/2021  ? COVID-19 virus infection 07/28/2021  ? Chronic indwelling Foley catheter 06/22/2021  ? Colostomy in place [facility]) 06/22/2021  ? TBI (traumatic brain injury) ([place])   ? Ulcer of right heel and midfoot with fat layer exposed ([place]) 04/14/2021  ? UTI (urinary tract infection) 02/23/2021  ? Osteomyelitis of vertebra, sacral and sacrococcygeal region [facility]) 10/18/2020  ? Hyponatremia 07/07/2020  ? Encephalopathy 07/07/2020  ? Chronic multifocal osteomyelitis of right foot ([place]) 07/07/2020  ? Anemia of chronic disease 07/07/2020  ? 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 Sacral osteomyelitis ([place]) 04/13/2020  ? Urinary retention 03/19/2020  ? History of DVT (deep vein thrombosis) 03/19/2020  ? Pressure injury of sacral region, stage 4 ([place]) 03/19/2020  ? Severe sepsis with septic shock ([place]) 03/18/2020  ? Obesity, Class III, BMI 40-49.9 (morbid obesity) ([place]) 03/18/2020  ? Cellulitis 03/18/2020  ? Acute cystitis without hematuria   ? Stage 3b chronic kidney disease (CKD) (HCC) - baseline SCr  1.8-1.9 01/20/2020  ? Pressure injury of skin 01/17/2020  ? Sepsis ([place]) 01/16/2020  ? Cellulitis of sacral region 01/16/2020  ? Acute kidney injury superimposed on chronic kidney disease ([place]) 01/16/2020  ? Acute metabolic encephalopathy [id number]  ? Bipolar disorder, in full remission, most recent episode mixed ([place]) 12/29/2019  ? High risk medication use 10/25/2019  ? Noncompliance with treatment plan 10/25/2019  ? Bipolar I disorder, most recent episode mixed ([place]) 01/07/2019  ? GAD (generalized anxiety disorder) 01/07/2019  ? 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 Bipolar disorder ([place]) 01/03/2016  ? Lithium toxicity 01/02/2016  ? Chronic anticoagulation - on Eliquis 07/06/2014  ? 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 Hyperlipidemia 07/06/2014  ? Hypertension 07/06/2014  ? Leg swelling 07/06/2014  ? Obesity, Class II, BMI 35-39.9, with comorbidity 07/06/2014  ? On medication for venous thromboembolism 07/06/2014  ? Sleep apnea 07/06/2014  ? Type II diabetes mellitus with renal manifestations ([place]) 07/06/2014  ? 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  ? Venous thromboembolism (VTE) 07/06/2014  ? ? ?Orientation RESPIRATION BLADDER Height & Weight   ?  ?Self, Time, Situation, Place ? Normal Continent, Indwelling catheter Weight: 279 lb (126.6 kg) ?Height:  5\' 6"  (167.6 cm)  ?BEHAVIORAL SYMPTOMS/MOOD NEUROLOGICAL BOWEL NUTRITION STATUS  ? (None)  (History of TBI) Continent Diet (Heart healthy/carb modified)  ?AMBULATORY STATUS COMMUNICATION OF NEEDS Skin   ?Extensive Assist Verbally Skin abrasions, PU Stage and Appropriate Care, Other (Comment) (Unstageable pressure injury on left heel (impregnated gauze (bismuth) and gauze  daily).) ?  ?  ?PU Stage 3 Dressing:  (Right posterior heel: impregnated gauze (bismuth) and gauze daily.) ?PU Stage 4 Dressing:  (Medial posterior sacrum:  Dakins soaked gauze, moisture barrier, ABD)   ?     ?     ? ? ?Personal Care Assistance Level of Assistance  ?    ?  ?  ?   ? ?Functional Limitations Info  ?Sight, Hearing, Speech Sight Info: Adequate ?Hearing Info: Adequate ?Speech Info: Adequate  ? ? ?SPECIAL CARE FACTORS FREQUENCY  ?PT (By licensed PT)   ?  ?PT Frequency: 5 x week ?  ?  ?  ?  ?   ? ? ?Contractures Contractures Info: Not present  ? ? ?Additional Factors Info  ?Code Status, Allergies Code Status Info: Full code ?Allergies Info: NKDA ?  ?  ?  ?   ? ?Current Medications (10/08/2021):  This is the current hospital active medication list ?Current Facility-Administered Medications  ?Medication Dose Route Frequency Provider Last Rate Last Admin  ? acetaminophen (TYLENOL) tablet 650 mg  650 mg Oral Q6H PRN Cox, Amy N, DO   650 mg at 10/07/21 1935  ? Or  ? acetaminophen (TYLENOL) suppository 650 mg  650 mg Rectal Q6H PRN Cox, Amy N, DO      ? apixaban (ELIQUIS) tablet 5 mg  5 mg Oral BID Cox, Amy N, DO   5 mg at 10/08/21 1142  ? ascorbic acid (VITAMIN C) tablet 500 mg  500 mg Oral Daily Cox, Amy N, DO   500 mg at 10/08/21 1142  ? ceFAZolin (ANCEF) IVPB 2g/100 mL premix  2 g Intravenous Q8H Danford, Suann Larry, MD 200 mL/hr at 10/08/21 0642 2 g at 10/08/21 [id number]  ? Chlorhexidine Gluconate Cloth 2 % PADS 6 each  6 each Topical Daily Danford, Suann Larry, MD      ? cholecalciferol (VITAMIN D3) tablet 1,000 Units  1,000 Units Oral Daily Renda Rolls, RPH   1,000 Units at 10/08/21 1142  ? clonazePAM (KLONOPIN) disintegrating tablet 0.25-0.5 mg  0.25-0.5 mg Oral Daily PRN Cox, Amy N, DO      ? hydrALAZINE (APRESOLINE) tablet 10 mg  10 mg Oral Q6H PRN Cox, Amy N, DO      ? magnesium sulfate IVPB 2 g 50 mL  2 g Intravenous Once Danford, Suann Larry, MD      ? melatonin tablet 5 mg  5 mg Oral QHS PRN Cox, Amy N, DO      ? memantine (NAMENDA) tablet 10 mg  10 mg Oral BID Cox, Amy N, DO   10 mg at 10/08/21 1142  ? omega-3 acid ethyl esters (LOVAZA) capsule 1  g  1 capsule Oral Daily Cox, Amy N, DO   1 g at 10/08/21 1143  ? ondansetron (ZOFRAN) tablet 4 mg  4 mg Oral Q6H PRN Cox, Amy N, DO      ? Or  ? ondansetron (ZOFRAN) injection 4 mg  4 mg Intravenous Q6H PRN Cox, Amy N, DO   4 mg at 10/07/21 2006  ? Oxcarbazepine (TRILEPTAL) tablet 300 mg  300 mg Oral BID Cox, Amy N, DO   300 mg at 10/08/21 1142  ? phosphorus (K PHOS NEUTRAL) tablet 500 mg  500 mg Oral Q4H Danford, Suann Larry, MD      ? polyethylene glycol (MIRALAX / GLYCOLAX) packet 17 g  17 g Oral BID PRN Cox, Amy N, DO   17 g at 10/08/21 1142  ?  QUEtiapine (SEROQUEL) tablet 100 mg  100 mg Oral QHS Cox, Amy N, DO   100 mg at 10/07/21 2253  ? QUEtiapine (SEROQUEL) tablet 25 mg  25 mg Oral Daily PRN Oswald Hillock, RPH      ? rosuvastatin (CRESTOR) tablet 20 mg  20 mg Oral QHS Cox, Amy N, DO   20 mg at 10/07/21 2253  ? sodium hypochlorite (DAKIN'S 1/4 STRENGTH) topical solution   Topical BID Edwin Dada, MD   Given at 10/08/21 1144  ? vitamin B-12 (CYANOCOBALAMIN) tablet 3,000 mcg  3,000 mcg Oral Daily Cox, Amy N, DO   3,000 mcg at 10/08/21 1142  ? zinc sulfate capsule 220 mg  220 mg Oral Daily Cox, Amy N, DO   220 mg at 10/08/21 1142  ? ? ? ?Discharge Medications: ?Please see discharge summary for a list of discharge medications. ? ?Relevant Imaging Results: ? ?Relevant Lab Results: ? ? ?Additional Information ?SS#: [id number]. Will likely discharge on 6 weeks IV abx. ID plan pending. ? ?Candie Chroman, LCSW ? ? ? ? ?

## 2021-10-08 NOTE — TOC Initial Note (Addendum)
Transition of Care (TOC) - Initial/Assessment Note  ? ? ?Patient Details  ?Name: [name] ?MRN: [id number] ?Date of Birth: 09/17/1955 ? ?Transition of Care (TOC) CM/SW Contact:    ? C , LCSW ?Phone Number: ?10/08/2021, 1:57 PM ? ?Clinical Narrative:   CSW met with patient. No supports at bedside. CSW introduced role and explained that PT recommendations would be discussed. Patient is agreeable to SNF placement. He gave CSW permission to call his wife. She is agreeable as well. First preference is [facility]. Admissions coordinator is aware and will review. Per MD, will likely discharge on 6 weeks IV abx. ID plan pending. Per MD, patient appears close to baseline so there is potential that insurance will deny authorization. Patient and wife are aware. No further concerns. CSW encouraged patient and his wife to contact CSW as needed. CSW will continue to follow patient and his wife for support and facilitate discharge to SNF once medically stable.       ? ?3:45 pm: [facility] is able to offer a bed. If he stays  past 20 days, copays are $196 per day so he would have to pay $3500 up front for days 21-39. Wife knows how to do home IV abx and is prepared to bring him home after 20 days.        ? ?Expected Discharge Plan: Skilled Nursing Facility ?Barriers to Discharge: Continued Medical Work up ? ? ?Patient Goals and CMS Choice ?  ?  ?  ? ?Expected Discharge Plan and Services ?Expected Discharge Plan: Skilled Nursing Facility ?  ?  ?Post Acute Care Choice: Skilled Nursing Facility ?Living arrangements for the past 2 months: Single Family Home ?                ?  ?  ?  ?  ?  ?  ?  ?  ?  ?  ? ?Prior Living Arrangements/Services ?Living arrangements for the past 2 months: Single Family Home ?Lives with:: Spouse ?Patient language and need for interpreter reviewed:: Yes ?Do you feel safe going back to the place where you live?: Yes      ?Need for Family Participation in Patient Care: Yes  (Comment) ?Care giver support system in place?: Yes (comment) ?Current home services: DME ?Criminal Activity/Legal Involvement Pertinent to Current Situation/Hospitalization: No - Comment as needed ? ?Activities of Daily Living ?  ?ADL Screening (condition at time of admission) ?Patient's cognitive ability adequate to safely complete daily activities?: No ?Is the patient deaf or have difficulty hearing?: No ?Does the patient have difficulty seeing, even when wearing glasses/contacts?: No ? ?Permission Sought/Granted ?Permission sought to share information with : Facility Contact Representative, Family Supports ?Permission granted to share information with : Yes, Verbal Permission Granted ? Share Information with NAME: [name] Zabawa ? Permission granted to share info w AGENCY: SNF's ? Permission granted to share info w Relationship: Wife ? Permission granted to share info w Contact Information: [phone] ? ?Emotional Assessment ?Appearance:: Appears stated age ?Attitude/Demeanor/Rapport: Engaged, Gracious ?Affect (typically observed): Accepting, Appropriate, Calm, Pleasant ?Orientation: : Oriented to Self, Oriented to Place, Oriented to  Time, Oriented to Situation ?Alcohol / Substance Use: Not Applicable ?Psych Involvement: No (comment) ? ?Admission diagnosis:  Altered mental status [R41.82] ?Hyperglycemia [R73.9] ?Wound of sacral region, initial encounter [S31.000A] ?Healed ulcer of foot on examination [Z87.2] ?Sepsis, due to unspecified organism, unspecified whether acute organ dysfunction present (HCC) [A41.9] ?MSSA bacteremia [R78.81, B95.61] ?Patient Active Problem List  ? Diagnosis Date Noted  ?   Sacral wound 10/07/2021  ? MSSA bacteremia 10/07/2021  ? Severe sepsis (HCC) 10/06/2021  ? Sepsis secondary to UTI (HCC) 07/28/2021  ? COVID-19 virus infection 07/28/2021  ? Chronic indwelling Foley catheter 06/22/2021  ? Colostomy in place (HCC) 06/22/2021  ? TBI (traumatic brain injury) (HCC)   ? Ulcer of  right heel and midfoot with fat layer exposed (HCC) 04/14/2021  ? UTI (urinary tract infection) 02/23/2021  ? Osteomyelitis of vertebra, sacral and sacrococcygeal region (HCC) 10/18/2020  ? Hyponatremia 07/07/2020  ? Encephalopathy 07/07/2020  ? 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) 07/07/2020  ? Anemia of chronic disease 07/07/2020  ? 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 Sacral osteomyelitis (HCC) 04/13/2020  ? Urinary retention 03/19/2020  ? History of DVT (deep vein thrombosis) 03/19/2020  ? Pressure injury of sacral region, stage 4 (HCC) 03/19/2020  ? Severe sepsis with septic shock (HCC) 03/18/2020  ? Obesity, Class III, BMI 40-49.9 (morbid obesity) (HCC) 03/18/2020  ? Cellulitis 03/18/2020  ? Acute cystitis without hematuria   ? Stage 3b chronic kidney disease (CKD) (HCC) - baseline SCr 1.8-1.9 01/20/2020  ? Pressure injury of skin 01/17/2020  ? Sepsis (HCC) 01/16/2020  ? Cellulitis of sacral region 01/16/2020  ? Acute kidney injury superimposed on chronic kidney disease (HCC) 01/16/2020  ? Acute metabolic encephalopathy 01/16/2020  ? Bipolar disorder, in full remission, most recent episode mixed (HCC) 12/29/2019  ? High risk medication use 10/25/2019  ? Noncompliance with treatment plan 10/25/2019  ? Bipolar I disorder, most recent episode mixed (HCC) 01/07/2019  ? GAD (generalized anxiety disorder) 01/07/2019  ? 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 Bipolar disorder (HCC) 01/03/2016  ? Lithium toxicity 01/02/2016  ? Chronic anticoagulation - on Eliquis 07/06/2014  ? 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 Hyperlipidemia 07/06/2014  ? Hypertension 07/06/2014  ? Leg swelling 07/06/2014  ? Obesity, Class II, BMI 35-39.9, with comorbidity 07/06/2014  ? On medication for venous thromboembolism 07/06/2014  ?  Sleep apnea 07/06/2014  ? Type II diabetes mellitus with renal manifestations (HCC) 07/06/2014  ? 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  ? Venous thromboembolism (VTE) 07/06/2014  ? ?PCP:  Partridge, James, MD ?Pharmacy:   ?[id number] - [place], [place] - [address] ?[address] ? [place] [address] ?Phone: [phone] Fax: [phone] ? ? ? ? ?Social Determinants of Health (SDOH) Interventions ?  ? ?Readmission Risk Interventions ? ?  04/15/2021  ?  2:50 PM  ?Readmission Risk Prevention Plan  ?Transportation Screening Complete  ?PCP or Specialist Appt within 3-5 Days Complete  ?HRI or Home Care Consult Complete  ?Social Work Consult for Recovery Care Planning/Counseling Complete  ?Palliative Care Screening Not Applicable  ?Medication Review (RN Care Manager) Complete  ? ? ? ?

## 2021-10-08 NOTE — Progress Notes (Signed)
?Progress Note ? ? ?PatientMichaela [name] [id number] DOB: Jul 18, 1955 DOA: 10/06/2021     1 ?DOS: the patient was seen and examined on 10/08/2021 at 10:13 AM ?  ? ? ? ?Brief hospital course: ?[name] is a 66 y.o. M with Bipolar, DM, sedentary due to neuropathy, now with chronic stage IV decub with osteomyelitis, neurogenic bladder with indwelling foley, hx DVT on Eliuqis, chronic bilateral heel wounds with bilateral heel osteomyelitis, CKD IIIb, anemia and recurrent UTI who presented with with confusion for one day noticed by a family member "talking out of his head." ? ?In the ER, Febrile, tachycardic and tachypneic.  Altered.  Unclear source.  EDP suspected flare of one of his osteomyelitis, vs UTI.    ? ? ? ? ?Assessment and Plan: ?* Severe sepsis ([place]) ?Patient presented with fever, tachycardia, tachypnea, decreased mentation and confusion. ? ?Blod cultures now growing MSSA in 2/2 on admission. ?- Continue antibiotics, narrow to cefazolin ?- Consult ID ?- Obtain Echo ?- Repeat cultures ?- Low threshold to image any satellite pain ? ? ? ? ? ?MSSA bacteremia ?See above ? ?AKI (acute kidney injury) ([place]) ?Cr 1.8 on admission, up to 2.7 today. ?CT with contrast on 4/8 ?Some soft BP overnight ?- Start IV fluids ?- Avoid further nephrotoxins ?- If Cr up tomorrow, will consult Nephrology ? ? ? ? ? ?Abdominal distension ?New.  No pain.  Had been soft prior. ?- Obtain Abd x-ray ? ?Sacral wound ?Stage IV, present on admission ?- Consult General Surgery ? ? ?TBI (traumatic brain injury) ([place]) ?Chronic. ? ?Chronic indwelling Foley catheter ?  ? ?Ulcer of right heel and midfoot with fat layer exposed ([place]) ?- Consult Podiatry ? ?Anemia of chronic disease ?Hemoglobin appears to be stable on his baseline range 8.2-9.9 ? ?Chronic multifocal osteomyelitis of right foot ([place]) ?  ? ?Hyponatremia ?Chronic.  [name] is around 130 ? ?Stage 3b chronic kidney disease (CKD) (HCC) - baseline SCr 1.8-1.9 ?Creatinine  within baseline range 1.66-2.15  ? ?History of DVT (deep vein thrombosis) ?- Continue Eliquis ? ?Urinary retention, chronic with indwelling Foley ?Per urology note on 08/09/2021: Patient has history of neurogenic bladder since early 2000's and reportedly due to urethral stricture.  Formally patient managed his bladder with self-catheterization 3-4 times per day, however since his health has declined over the last 20 months, he currently has been requiring indwelling Foley catheter and changed monthly by home health nursing. ? ?Obesity, Class III, BMI 40-49.9 (morbid obesity) ([place]) ?BMI 45 ? ?Acute metabolic encephalopathy ?Patient at baseline has mild cognitive impairment, but oriented and appropriate, but on presentation he was confused and talking out of his head.  This appears to be resolved.  Likely cause was infection ? ?Bipolar disorder, in full remission, most recent episode mixed ([place]) ?- Continue quetiapine, oxcarbazepine  ? ?GAD (generalized anxiety disorder) ?As needed Klonopin ? ?Sleep apnea ?  ? ?Hypertension ?BP soft ?- Hold amlodipine  ? ?Hyperlipidemia ?- Resumed rosuvastatin 20 mg nightly ? ?Chronic anticoagulation - on Eliquis ?- Continue Eliquis ? ?  ? ? ? ? ? ? ? ? ? ?Subjective: Patient has no complaints, he feels well, appetite is okay.  No confusion.  No vomiting.  No diarrhea. ? ? ? ? ?Physical Exam: ?Vitals:  ? 10/08/21 0818 10/08/21 1156 10/08/21 1627 10/08/21 1752  ?BP: 128/64 118/68 119/74 124/71  ?Pulse: 88 88 98 96  ?Resp: 20 20 18 20   ?Temp: 98.5 ?F (36.9 ?C) 98.2 ?F (36.8 ?C) 98.3 ?F (  36.8 ?C) (!) 100.4 ?F (38 ?C)  ?TempSrc: Oral Oral Oral Oral  ?SpO2: 98% 100% 100% 98%  ?Weight:      ?Height:      ? ?Obese elderly male, chronically ill-appearing, lying in bed, debilitated. ?RRR, no murmurs, no murmur no peripheral edema ?Lungs clear without rales or wheezes ?Abdomen soft, distended but no rigidity, no firmness, no guarding ?Bilateral heels with some eschar, no surrounding cellulitis,  the sacral wound is deep, but has no drainage or surrounding cellulitis ?Attention seems normal, makes eye contact and is interactive, but psychomotor slowing is mild, moves upper extremities with normal strength and coordination. ? ? ? ? ?Data Reviewed: ?Discussed with infectious disease, nursing notes reviewed, overnight notes reviewed, vital signs reviewed. ?Creatinine up to 2.7 from 1.8, cortisol normal ?White blood cell count up to 13 ?Hemoglobin 8, no change ?Sodium stable at 129 ?Echocardiogram pending ?Blood cultures positive for MSSA in 2 of 2 ? ? ? ? ? ? ?Family Communication: Wife by phone ? ? ? ?Disposition: ?Status is: Inpatient ?The patient was admitted with staph bacteremia. ? ?He has now has worsening kidney function ? ? ? ? ? ? ? ?Author: ?[name] Dada, MD ?10/08/2021 6:25 PM ? ?For on call review [email or web address].  ? ? ?

## 2021-10-08 NOTE — Progress Notes (Signed)
Sacral wound changed. Pt tolerated well. ?

## 2021-10-08 NOTE — Evaluation (Signed)
Physical Therapy Evaluation ?Patient Details ?Name: [name] ?MRN: [id number] ?DOB: [birth year] ?Today's Date: 10/08/2021 ? ?History of Present Illness ? Pt is a 66 y/o M admitted on 10/06/21 after presenting with c/o fever, tachycardia & tachypnia. Pt is being treated for severe sepsis & acute metabolic encephalopathy. PMH: bipolar, DM, paraplegia 2/2 neuropathy, chronic sacral wound with osteomyelitis, neurogenic bladder with indwelling foley, DVT on eliquis, chronic B heel wounds with osteomyelitis, CKD 3b, anemia, recurrent UTI  ?Clinical Impression ? Pt seen for PT evaluation with pt known to this PT from previous admissions. Pt reports he was still living at home with his wife who assisted him with stand pivot transfers bed<>w/c. On this date, pt requires +2 assist for rolling L and is unable to transfer to sitting EOB with +1 max assist. Pt presents with chronic sacral wound & B heel wounds that suggest limited mobility at baseline. Pt reports he is doing worse than prior to admission & is agreeable to STR upon d/c. Pt would benefit from STR upon d/c to maximize independence with functional mobility, reduce fall risk, & decrease caregiver burden prior to return home. ?   ?   ? ?Recommendations for follow up therapy are one component of a multi-disciplinary discharge planning process, led by the attending physician.  Recommendations may be updated based on patient status, additional functional criteria and insurance authorization. ? ?Follow Up Recommendations Skilled nursing-short term rehab (<3 hours/day) ? ?  ?Assistance Recommended at Discharge Frequent or constant Supervision/Assistance  ?Patient can return home with the following ? Two people to help with walking and/or transfers;Two people to help with bathing/dressing/bathroom;Help with stairs or ramp for entrance;Assist for transportation;Direct supervision/assist for medications management;Assistance with cooking/housework;Direct  supervision/assist for financial management ? ?  ?Equipment Recommendations  (hoyer lift & pad)  ?Recommendations for Other Services ?    ?  ?Functional Status Assessment Patient has had a recent decline in their functional status and demonstrates the ability to make significant improvements in function in a reasonable and predictable amount of time.  ? ?  ?Precautions / Restrictions Precautions ?Precautions: Fall ?Restrictions ?Weight Bearing Restrictions: No ?Other Position/Activity Restrictions: wounds on B heels & sacrum  ? ?  ? ?Mobility ? Bed Mobility ?Overal bed mobility: Needs Assistance ?Bed Mobility: Rolling, Supine to Sit ?Rolling: +2 for physical assistance ?  ?Supine to sit: Max assist (attempts supine>sit with +1 assist but unable to successfully sit EOB/come to sitting upright as pt required more than +1 assist & no +2 available) ?  ?  ?General bed mobility comments: +2 to scoot to [facility] in trendelenburg position, pt unable to assist with BUE on bed rails ?  ? ?Transfers ?  ?  ?  ?  ?  ?  ?  ?  ?  ?  ?  ? ?Ambulation/Gait ?  ?  ?  ?  ?  ?  ?  ?  ? ?Stairs ?  ?  ?  ?  ?  ? ?Wheelchair Mobility ?  ? ?Modified Rankin (Stroke Patients Only) ?  ? ?  ? ?Balance   ?  ?  ?  ?  ?  ?  ?  ?  ?  ?  ?  ?  ?  ?  ?  ?  ?  ?  ?   ? ? ? ?Pertinent Vitals/Pain Pain Assessment ?Pain Assessment: Faces ?Faces Pain Scale: Hurts a little bit ?Pain Location: wounds on heels ?Pain Descriptors / Indicators: Discomfort ?Pain Intervention(s):  Monitored during session, Repositioned  ? ? ?Home Living Family/patient expects to be discharged to:: Private residence (information taken from pt & chart) ?Living Arrangements: Spouse/significant other ?Available Help at Discharge: Family;Available PRN/intermittently;Personal care attendant (PCA 3x/week) ?Type of Home: House ?Home Access: Stairs to enter;Ramped entrance ?  ?Entrance Stairs-Number of Steps: ramp to garage ?  ?Home Layout: One level ?Home Equipment: Wheelchair -  manual;Wheelchair - power;Hospital bed;Shower seat ?   ?  ?Prior Function Prior Level of Function : Needs assist ?  ?  ?  ?  ?  ?  ?Mobility Comments: Pt reports his wife assists him with stand pivot transfers, was able to perform this ~2 days ago. ?  ?  ? ? ?Hand Dominance  ?   ? ?  ?Extremity/Trunk Assessment  ? Upper Extremity Assessment ?Upper Extremity Assessment: Generalized weakness ?  ? ?Lower Extremity Assessment ?Lower Extremity Assessment: Generalized weakness (wounds on B heels, need to assess PROM further but no active dorsiflexion noted in either BLE) ?  ? ?Cervical / Trunk Assessment ?Cervical / Trunk Assessment:  (wounds to sacrum)  ?Communication  ? Communication: HOH  ?Cognition Arousal/Alertness: Awake/alert ?Behavior During Therapy: [facility] for tasks assessed/performed ?Overall Cognitive Status: Within Functional Limits for tasks assessed ?  ?  ?  ?  ?  ?  ?  ?  ?  ?  ?  ?  ?  ?  ?  ?  ?General Comments: AxOx4 ?  ?  ? ?  ?General Comments   ? ?  ?Exercises    ? ?Assessment/Plan  ?  ?PT Assessment Patient needs continued PT services  ?PT Problem List Decreased strength;Decreased mobility;Decreased safety awareness;Decreased coordination;Decreased range of motion;Decreased activity tolerance;Cardiopulmonary status limiting activity;Decreased skin integrity;Pain;Decreased knowledge of use of DME;Decreased balance ? ?   ?  ?PT Treatment Interventions DME instruction;Therapeutic activities;Modalities;Patient/family education;Therapeutic exercise;Balance training;Wheelchair mobility training;Functional mobility training;Neuromuscular re-education;Manual techniques   ? ?PT Goals (Current goals can be found in the Care Plan section)  ?Acute Rehab PT Goals ?Patient Stated Goal: get better ?PT Goal Formulation: With patient ?Time For Goal Achievement: 10/22/21 ?Potential to Achieve Goals: Good ? ?  ?Frequency Min 2X/week ?  ? ? ?Co-evaluation   ?  ?  ?  ?  ? ? ?  ?AM-PAC PT "6 Clicks" Mobility  ?Outcome  Measure Help needed turning from your back to your side while in a flat bed without using bedrails?: Total ?Help needed moving from lying on your back to sitting on the side of a flat bed without using bedrails?: Total ?Help needed moving to and from a bed to a chair (including a wheelchair)?: Total ?Help needed standing up from a chair using your arms (e.g., wheelchair or bedside chair)?: Total ?Help needed to walk in hospital room?: Total ?Help needed climbing 3-5 steps with a railing? : Total ?6 Click Score: 6 ? ?  ?End of Session Equipment Utilized During Treatment:  (prevalon boots on BLE) ?Activity Tolerance: Patient tolerated treatment well ?Patient left: in bed;with call bell/phone within reach;with bed alarm set (prevalon boots on BLE) ?Nurse Communication: Mobility status ?PT Visit Diagnosis: Other abnormalities of gait and mobility (R26.89);Muscle weakness (generalized) (M62.81);Difficulty in walking, not elsewhere classified (R26.2) ?  ? ?Time: 3419-3790 ?PT Time Calculation (min) (ACUTE ONLY): 12 min ? ? ?Charges:   PT Evaluation ?$PT Eval High Complexity: 1 High ?  ?  ?   ? ? ?Aleda Grana, PT, DPT ?10/08/21, 10:43 AM ? ? ?Sandi Mariscal ?10/08/2021, 10:42  AM ? ?

## 2021-10-08 NOTE — Assessment & Plan Note (Signed)
See above

## 2021-10-08 NOTE — Progress Notes (Signed)
Pt running fever this evening 100.4, rest of his vitals good. Dr. Maryfrances Bunnell aware. ? ?Emptied pt's colostomy twice. Pt gassy today, liquid stool. Pt's abdomen continues to be very distended and taut. Dr. Maryfrances Bunnell aware.  ?

## 2021-10-09 ENCOUNTER — Ambulatory Visit: Payer: Medicare Other | Admitting: Physician Assistant

## 2021-10-09 ENCOUNTER — Inpatient Hospital Stay: Payer: Medicare Other

## 2021-10-09 DIAGNOSIS — G9341 Metabolic encephalopathy: Secondary | ICD-10-CM | POA: Diagnosis not present

## 2021-10-09 DIAGNOSIS — N179 Acute kidney failure, unspecified: Secondary | ICD-10-CM | POA: Diagnosis not present

## 2021-10-09 DIAGNOSIS — A419 Sepsis, unspecified organism: Secondary | ICD-10-CM | POA: Diagnosis not present

## 2021-10-09 DIAGNOSIS — D638 Anemia in other chronic diseases classified elsewhere: Secondary | ICD-10-CM | POA: Diagnosis not present

## 2021-10-09 DIAGNOSIS — I959 Hypotension, unspecified: Secondary | ICD-10-CM

## 2021-10-09 LAB — RENAL FUNCTION PANEL
Albumin: 2.1 g/dL — ABNORMAL LOW (ref 3.5–5.0)
Anion gap: 9 (ref 5–15)
BUN: 33 mg/dL — ABNORMAL HIGH (ref 8–23)
CO2: 18 mmol/L — ABNORMAL LOW (ref 22–32)
Calcium: 7.9 mg/dL — ABNORMAL LOW (ref 8.9–10.3)
Chloride: 101 mmol/L (ref 98–111)
Creatinine, Ser: 2.37 mg/dL — ABNORMAL HIGH (ref 0.61–1.24)
GFR, Estimated: 29 mL/min — ABNORMAL LOW (ref >60)
Glucose, Bld: 163 mg/dL — ABNORMAL HIGH (ref 70–99)
Phosphorus: 3.4 mg/dL (ref 2.5–4.6)
Potassium: 3.8 mmol/L (ref 3.5–5.1)
Sodium: 128 mmol/L — ABNORMAL LOW (ref 135–145)

## 2021-10-09 LAB — CREATININE, SERUM
Creatinine, Ser: 2.32 mg/dL — ABNORMAL HIGH (ref 0.61–1.24)
GFR, Estimated: 30 mL/min — ABNORMAL LOW (ref >60)

## 2021-10-09 LAB — MAGNESIUM: Magnesium: 2.1 mg/dL (ref 1.7–2.4)

## 2021-10-09 LAB — CBC
HCT: 24.5 % — ABNORMAL LOW (ref 39.0–52.0)
Hemoglobin: 7.6 g/dL — ABNORMAL LOW (ref 13.0–17.0)
MCH: 25.9 pg — ABNORMAL LOW (ref 26.0–34.0)
MCHC: 31 g/dL (ref 30.0–36.0)
MCV: 83.3 fL (ref 80.0–100.0)
Platelets: 160 10*3/uL (ref 150–400)
RBC: 2.94 MIL/uL — ABNORMAL LOW (ref 4.22–5.81)
RDW: 16.3 % — ABNORMAL HIGH (ref 11.5–15.5)
WBC: 9.9 10*3/uL (ref 4.0–10.5)
nRBC: 0 % (ref 0.0–0.2)

## 2021-10-09 LAB — CULTURE, BLOOD (ROUTINE X 2)

## 2021-10-09 LAB — URINE CULTURE: Culture: >=100000 — AB

## 2021-10-09 LAB — PHOSPHORUS: Phosphorus: 3.5 mg/dL (ref 2.5–4.6)

## 2021-10-09 MED ORDER — SODIUM CHLORIDE 0.9 % IV BOLUS
1000.0000 mL | Freq: Once | INTRAVENOUS | Status: AC
Start: 2021-10-09 — End: 2021-10-09
  Administered 2021-10-09: 1000 mL via INTRAVENOUS

## 2021-10-09 MED ORDER — SODIUM CHLORIDE 0.9 % IV BOLUS
1000.0000 mL | Freq: Once | INTRAVENOUS | Status: AC
  Administered 2021-10-09: 1000 mL via INTRAVENOUS

## 2021-10-09 NOTE — Progress Notes (Signed)
? ?Date of Admission:  10/06/2021    ?ID: [name] is a 66 y.o. male  ?Principal Problem: ?  Severe sepsis (HCC) ?Active Problems: ?  Chronic anticoagulation - on Eliquis ?  Hypertension ?  Sleep apnea ?  GAD (generalized anxiety disorder) ?  Bipolar disorder, in full remission, most recent episode mixed (HCC) ?  Acute metabolic encephalopathy ?  Obesity, Class III, BMI 40-49.9 (morbid obesity) (HCC) ?  Urinary retention ?  History of DVT (deep vein thrombosis) ?  Stage 3b chronic kidney disease (CKD) (HCC) - baseline SCr 1.8-1.9 ?  Hyponatremia ?  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  Anemia of chronic disease ?  Ulcer of right heel and midfoot with fat layer exposed (HCC) ?  Chronic indwelling Foley catheter ?  TBI (traumatic brain injury) (HCC) ?  Sacral wound ?  MSSA bacteremia ?  Abdominal distension ?  AKI (acute kidney injury) (HCC) ?  Hypotension ? ? ? ?Subjective: ?Pt has no complaints ?But has soft BP and getting fluids ? ?Medications:  ? apixaban  5 mg Oral BID  ? ascorbic acid  500 mg Oral BID  ? Chlorhexidine Gluconate Cloth  6 each Topical Daily  ? cholecalciferol  1,000 Units Oral Daily  ? feeding supplement (GLUCERNA SHAKE)  237 mL Oral BID BM  ? memantine  10 mg Oral BID  ? multivitamin with minerals  1 tablet Oral Daily  ? omega-3 acid ethyl esters  1 capsule Oral Daily  ? Oxcarbazepine  300 mg Oral BID  ? Ensure Max Protein  11 oz Oral QHS  ? QUEtiapine  100 mg Oral QHS  ? rosuvastatin  20 mg Oral QHS  ? sodium hypochlorite   Topical BID  ? vitamin B-12  3,000 mcg Oral Daily  ? zinc sulfate  220 mg Oral Daily  ? ? ?Objective: ?Vital signs in last 24 hours: ?Temp:  [97.5 ?F (36.4 ?C)-100.4 ?F (38 ?C)] 98.7 ?F (37.1 ?C) (04/11 1157) ?Pulse Rate:  [78-98] 78 (04/11 1356) ?Resp:  [16-20] 18 (04/11 1157) ?BP: (91-124)/(53-74) 104/66 (04/11 1356) ?SpO2:  [94 %-100 %] 99 % (04/11 1157) ? ?PHYSICAL EXAM:  ?General: Alert, , no distress, pale.  ?Head: Normocephalic, without  obvious abnormality, atraumatic. ?Lungs: Clear to auscultation bilaterally. No Wheezing or Rhonchi. No rales. ?Heart: Regular rate and rhythm, no murmur, rub or gallop. ?Abdomen: Soft, non-tender,not distended. Bowel sounds normal. No masses ?Extremities: ?Left heel eschar ? ?Skin: sacral wound ?Lymph: Cervical, supraclavicular normal. ?Neurologic: paraperesis ?Lab Results ?Recent Labs  ?  10/08/21 ?5701 10/09/21 ?7793  ?WBC 13.8* 9.9  ?HGB 8.1* 7.6*  ?HCT 25.3* 24.5*  ?NA 129* 128*  ?K 4.4 3.8  ?CL 102 101  ?CO2 19* 18*  ?BUN 26* 33*  ?CREATININE 2.72* 2.37*  2.32*  ? ?Liver Panel ?Recent Labs  ?  10/06/21 ?1956 10/09/21 ?0628  ?PROT 8.1  --   ?ALBUMIN 2.8* 2.1*  ?AST 15  --   ?ALT 11  --   ?ALKPHOS 85  --   ?BILITOT 0.6  --   ? ?Sedimentation Rate ?No results for input(s): ESRSEDRATE in the last 72 hours. ?C-Reactive Protein ?No results for input(s): CRP in the last 72 hours. ? ?Microbiology: ?4/8 2 of 2 sets MSSA ?4/11 BC- NG ?4/8 UC VRE , MDR pseudomonas ?Studies/Results: ?DG Abd 1 View ? ?Result Date: 10/08/2021 ?CLINICAL DATA:  Abdominal distension. EXAM: ABDOMEN - 1 VIEW COMPARISON:  October 07, 2021 FINDINGS: The bowel gas pattern  is normal. No radio-opaque calculi or other significant radiographic abnormality are seen. IMPRESSION: Negative. Electronically Signed   By: Virgina Norfolk M.D.   On: 10/08/2021 19:46  ? ?DG Abd 1 View ? ?Result Date: 10/07/2021 ?CLINICAL DATA:  Vomiting EXAM: ABDOMEN - 1 VIEW COMPARISON:  None. FINDINGS: The bowel gas pattern is normal. No radio-opaque calculi or other significant radiographic abnormality are seen. IMPRESSION: Negative. Electronically Signed   By: Ulyses Jarred M.D.   On: 10/07/2021 22:27  ? ?ECHOCARDIOGRAM COMPLETE ? ?Result Date: 10/07/2021 ?   ECHOCARDIOGRAM REPORT   Patient Name:   [name] Date of Exam: 10/07/2021 Medical Rec #:  [id number]             Height:       66.0 in Accession #:    [id number]            Weight:       279.0 lb Date of Birth:   [birth year]            BSA:          2.304 m? Patient Age:    70 years              BP:           130/62 mmHg Patient Gender: M                     HR:           99 bpm. Exam Location:  [facility] Procedure: 2D Echo Indications:     Bacteremia R78.81  History:         Patient has no prior history of Echocardiogram examinations.  Sonographer:     Kathlen Brunswick RDCS Referring Phys:  [id number] Suann Larry DANFORD Diagnosing Phys: Neoma Laming  Sonographer Comments: Technically challenging study due to limited acoustic windows. Patient tremors. IMPRESSIONS  1. Left ventricular ejection fraction, by estimation, is 60 to 65%. The left ventricle has normal function. The left ventricle has no regional wall motion abnormalities. Left ventricular diastolic parameters are consistent with Grade III diastolic dysfunction (restrictive).  2. Right ventricular systolic function is normal. The right ventricular size is normal.  3. Left atrial size was mildly dilated.  4. The mitral valve is normal in structure. No evidence of mitral valve regurgitation. No evidence of mitral stenosis.  5. The aortic valve is normal in structure. Aortic valve regurgitation is mild. Aortic valve sclerosis is present, with no evidence of aortic valve stenosis.  6. The inferior vena cava is normal in size with greater than 50% respiratory variability, suggesting right atrial pressure of 3 mmHg. FINDINGS  Left Ventricle: Left ventricular ejection fraction, by estimation, is 60 to 65%. The left ventricle has normal function. The left ventricle has no regional wall motion abnormalities. The left ventricular internal cavity size was normal in size. There is  no left ventricular hypertrophy. Left ventricular diastolic parameters are consistent with Grade III diastolic dysfunction (restrictive). Right Ventricle: The right ventricular size is normal. No increase in right ventricular wall thickness. Right ventricular systolic function is normal. Left Atrium: Left  atrial size was mildly dilated. Right Atrium: Right atrial size was normal in size. Pericardium: There is no evidence of pericardial effusion. Mitral Valve: The mitral valve is normal in structure. No evidence of mitral valve regurgitation. No evidence of mitral valve stenosis. Tricuspid Valve: The tricuspid valve is normal in structure. Tricuspid valve regurgitation is not demonstrated. No evidence of tricuspid stenosis.  Aortic Valve: The aortic valve is normal in structure. Aortic valve regurgitation is mild. Aortic valve sclerosis is present, with no evidence of aortic valve stenosis. Aortic valve peak gradient measures 4.0 mmHg. Pulmonic Valve: The pulmonic valve was normal in structure. Pulmonic valve regurgitation is trivial. No evidence of pulmonic stenosis. Aorta: The aortic root is normal in size and structure. Venous: The inferior vena cava is normal in size with greater than 50% respiratory variability, suggesting right atrial pressure of 3 mmHg. IAS/Shunts: No atrial level shunt detected by color flow Doppler.  LEFT VENTRICLE PLAX 2D LVIDd:         4.95 cm LVIDs:         3.42 cm LV PW:         1.03 cm LV IVS:        0.98 cm LVOT diam:     2.40 cm LVOT Area:     4.52 cm?  LEFT ATRIUM         Index LA diam:    2.50 cm 1.09 cm/m?  AORTIC VALVE AV Vmax:      100.00 cm/s AV Peak Grad: 4.0 mmHg  AORTA Ao Root diam: 3.20 cm MITRAL VALVE MV Area (PHT): 3.72 cm?    SHUNTS MV Decel Time: 204 msec    Systemic Diam: 2.40 cm MV E velocity: 62.10 cm/s MV A velocity: 64.30 cm/s MV E/A ratio:  0.97 Shaukat Khan Electronically signed by Neoma Laming Signature Date/Time: 10/07/2021/3:04:01 PM    Final    ? ? 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 ?  ?AKI on CKD ?  ?H/o DVT on  eliquis ?  ?HTN amlodipine on hold because of borderline BP ?  ?TBI ? ?Discussed the management with care team ?  ?

## 2021-10-09 NOTE — Assessment & Plan Note (Signed)
BP usually 120-140s at home, and on amlodipine ? ?Today, BP low this morning, given fluids, persistently 90s.   ?- Bolus second liter IVF now ?- If no resolution will contact CCM for trasnsfer ?

## 2021-10-09 NOTE — Progress Notes (Signed)
?Progress Note ? ? ?PatientTamer [name] JSH:[id number] DOB: Dec 31, 1955 DOA: 10/06/2021     2 ?DOS: the patient was seen and examined on 10/09/2021 at 10:53 AM ?  ? ? ? ?Brief hospital course: ?[name] is a 66 y.o. M with Bipolar, DM, sedentary due to neuropathy, now with chronic stage IV decub with osteomyelitis, neurogenic bladder with indwelling foley, hx DVT on Eliuqis, chronic bilateral heel wounds with bilateral heel osteomyelitis, CKD IIIb, anemia and recurrent UTI who presented with with confusion for one day noticed by a family member "talking out of his head." ? ?In the ER, Febrile, tachycardic and tachypneic.  Altered.  Unclear source.  EDP suspected flare of one of his osteomyelitis, vs UTI.    ? ? ?4/8: Admitted on antibiotics ?4/10: Blood cultures with MSSA in 2/2 overnight; ID consulted; Cr up to 2.7 ?4/11: BP low, increasing fluids ? ? ? ? ?Assessment and Plan: ?* Severe sepsis due to MSSA bacteremia ?Patient presented with fever, tachycardia, tachypnea, decreased mentation and confusion. ? ?Blood cultures growing MSSA in 2/2 on admission. ?Echo without significant vegetation ?-Continue cefazolin ?- Consult ID ?- Given suspected osteo and plan for 6 weeks Abx, will defer TEE ? ?- Repeat cultures ?- Monitor for satellite sites of infection ? ? ?  ? ?Hypotension ?BP usually 120-140s at home, and on amlodipine ? ?Today, BP low this morning, given fluids, persistently 90s.   ?- Bolus second liter IVF now ?- If no resolution will contact CCM for trasnsfer ? ? ? ?AKI (acute kidney injury) (HCC) ?New onset Monday, 4/10 ?Cr 1.8 on admission, up to 2.7 yesterday ?Notable for CT with contrast on 4/8 and some hypotension last 48 hours ? ?Cr today improved slightly 2.3 ?- Continue IV fluids ?- Obtain renal US ?- Avoid further nephrotoxins ? ? ? ? ? ?Abdominal distension ?No pain.  Patient denies complaints but abdomen more distended today.   ?Abd x-ray last night without abnormal bowel gas  pattern. ?- If persistent, will obtain US abdomen ? ?Sacral wound ?Stage IV, present on admission.  Does not appear infected.  Defer surgical consultation to ID. ? ? ?TBI (traumatic brain injury) (HCC) ?Chronic. ? ?Chronic indwelling Foley catheter ?See below re: urinary retention  ? ?Ulcer of right heel and midfoot with fat layer exposed (HCC) ?With osteo. ? ?Anemia of chronic disease ?Hgb trending down.  Baseline range 8.2-9.9 ?No clinical bleeding ?-Continue B12 supplement ? 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  ? ?Hyponatremia ?Chronic.  [name] is around 130 ? ?Stage 3b chronic kidney disease (CKD) (HCC) - baseline SCr 1.8-1.9 ?Creatinine within baseline range 1.66-2.15.  See above re: AKI ? ?History of DVT (deep vein thrombosis) ?- Continue Eliquis ? ?Urinary retention ?Per urology note on 08/09/2021: Patient has history of neurogenic bladder since early 2000's and reportedly due to urethral stricture.  Formally patient managed his bladder with self-catheterization 3-4 times per day, however since his health has declined over the last 20 months, he currently has been requiring indwelling Foley catheter and changed monthly by home health nursing. ? ?Obesity, Class III, BMI 40-49.9 (morbid obesity) (HCC) ?BMI 45 ? ?Acute metabolic encephalopathy ?Patient at baseline has mild cognitive impairment, but oriented and appropriate.  On presentation he was confused and talking out of his head.  This appears to be resolved.  Likely cause was infection ? ?Bipolar disorder, in full remission, most recent episode mixed (HCC) ?- Continue quetiapine, oxcarbazepine  ? ?GAD (generalized anxiety disorder) ?- Avoid  clonazepam given encphealopathy ? ?Sleep apnea ?  ? ?Hypertension ?BP soft ?- Hold amlodipine  ? ?Hyperlipidemia ?- Resumed rosuvastatin 20 mg nightly ? ?Chronic anticoagulation - on Eliquis ?- Continue Eliquis ?  ? ? ? ? ? ? ? ? ? ?Subjective: Patient has no complaints.  He has no fever, no cough,  no urinary symptoms, no pain in his abdomen or chest.  His belly seems distended to nursing, who also noted hypotension this morning. ? ? ? ? ?Physical Exam: ?Vitals:  ? 10/09/21 0733 10/09/21 1000 10/09/21 1157 10/09/21 1356  ?BP: (!) 98/53 96/60 (!) 91/55 104/66  ?Pulse: 82 88 81 78  ?Resp: 17  18   ?Temp: (!) 97.5 ?F (36.4 ?C)  98.7 ?F (37.1 ?C)   ?TempSrc:      ?SpO2: 95% 100% 99%   ?Weight:      ?Height:      ? ?Elderly adult male, overweight, lying in bed, appears debilitated, watching television. ?RRR, no murmurs, no lower extremity edema ?Normal respiratory rate and rhythm, lungs clear without rales or wheezes ?Abdomen somewhat distended, feels taut with ascites, no tenderness palpation anywhere, no rigidity or guarding ?Attention seems normal, responds to questions appropriately, affect blunted, judgment and insight appears moderately impaired, at baseline, face symmetric, speech fluent ? ? ? ? ? ?Data Reviewed: ?Infectious disease notes reviewed, vital signs reviewed, nursing notes reviewed. ?Labs notable for basic metabolic panel with creatinine down to 2.3 ?Magnesium normalized ?Phosphate normal ?Hemogram shows normal white blood cell count, hemoglobin down to 7.6 ? ? ? ?Family Communication: wife by phone ? ? ? ?Disposition: ?Status is: Inpatient ?The patient presented with staph bacteremia ? ?He has soft BP today, and we will continue IV fluids and IV antibiotics. ? ?In a best case, his BP will resolved, his repeat culture will be negative at 48 hours tomorrrow, we can place PICC line tomorrow or Thursday and he can d/c to SNF by Thurs  ? ? ? ? ? ? ? ? ? ? ? ?Author: ?Edwin Dada, MD ?10/09/2021 2:02 PM ? ?For on call review [email or web address].  ? ? ?

## 2021-10-09 NOTE — TOC Progression Note (Addendum)
Transition of Care (TOC) - Progression Note  ? ? ?Patient Details  ?Name: [name] ?MRN: [id number] ?Date of Birth: 02/22/1956 ? ?Transition of Care (TOC) CM/SW Contact  ?Margarito Liner, LCSW ?Phone Number: ?10/09/2021, 8:50 AM ? ?Clinical Narrative:   Notified Sports coach that patient has a colostomy. She asked for size of the bag. Sent secure chat to RN. ? ?10:30 am: Per RN, "Order Supplies: 2-piece, 2 and 3/4 inch ostomy pouching system plus skin barrier ring. Pouch is [employer], Skin barrier is [employer] [place] # 2 and skin barrier ring is [employer] [place] # [phone]" Gave information to SNF admissions coordinator. ? ?1:25 pm: Started auth through [facility] portal. ? ?2:43 pm: Per ID pharmacist, will most likely discharge on Cefazolin 2g every 8 hours. SNF admissions coordinator is aware. ? ?Expected Discharge Plan: Skilled Nursing Facility ?Barriers to Discharge: Continued Medical Work up ? ?Expected Discharge Plan and Services ?Expected Discharge Plan: Skilled Nursing Facility ?  ?  ?Post Acute Care Choice: Skilled Nursing Facility ?Living arrangements for the past 2 months: Single Family Home ?                ?  ?  ?  ?  ?  ?  ?  ?  ?  ?  ? ? ?Social Determinants of Health (SDOH) Interventions ?  ? ?Readmission Risk Interventions ? ?  04/15/2021  ?  2:50 PM  ?Readmission Risk Prevention Plan  ?Transportation Screening Complete  ?PCP or Specialist Appt within 3-5 Days Complete  ?HRI or Home Care Consult Complete  ?Social Work Consult for Recovery Care Planning/Counseling Complete  ?Palliative Care Screening Not Applicable  ?Medication Review Oceanographer) Complete  ? ? ?

## 2021-10-09 NOTE — Progress Notes (Signed)
Physical Therapy Treatment ?Patient Details ?Name: [name] ?MRN: [id number] ?DOB: 1956-05-18 ?Today's Date: 10/09/2021 ? ? ?History of Present Illness Pt is a 66 y/o M admitted on 10/06/21 after presenting with c/o fever, tachycardia & tachypnia. Pt is being treated for severe sepsis & acute metabolic encephalopathy. 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 ?  ?PT Comments  ? ? Patient declined bed mobility. He was agreeable to exercises in bed which were performed with cues for technique for strengthening. Educated patient on importance of routine repositioning to prevent against skin breakdown. Patient likely to require significant assistance with bed mobility efforts. Recommend to continue PT to maximize independence and decrease caregiver burden. SNF recommended at discharge.  ?  ?Recommendations for follow up therapy are one component of a multi-disciplinary discharge planning process, led by the attending physician.  Recommendations may be updated based on patient status, additional functional criteria and insurance authorization. ? ?Follow Up Recommendations ? Skilled nursing-short term rehab (<3 hours/day) ?  ?  ?Assistance Recommended at Discharge Frequent or constant Supervision/Assistance  ?Patient can return home with the following Two people to help with walking and/or transfers;Two people to help with bathing/dressing/bathroom;Help with stairs or ramp for entrance;Assist for transportation;Direct supervision/assist for medications management;Assistance with cooking/housework;Direct supervision/assist for financial management ?  ?Equipment Recommendations ?  (to be determined at next level of care)  ?  ?Recommendations for Other Services   ? ? ?  ?Precautions / Restrictions Precautions ?Precautions:  Fall ?Restrictions ?Weight Bearing Restrictions: No  ?  ? ?Mobility ? Bed Mobility ?  ?  ?  ?  ?  ?  ?  ?General bed mobility comments: patient declined mobility. educated patient on importance of routine repositioning to prevent against skin breakdown ?  ? ?Transfers ?  ?  ?  ?  ?  ?  ?  ?  ?  ?  ?  ? ?Ambulation/Gait ?  ?  ?  ?  ?  ?  ?  ?  ? ? ?Stairs ?  ?  ?  ?  ?  ? ? ?Wheelchair Mobility ?  ? ?Modified Rankin (Stroke Patients Only) ?  ? ? ?  ?Balance   ?  ?  ?  ?  ?  ?  ?  ?  ?  ?  ?  ?  ?  ?  ?  ?  ?  ?  ?  ? ?  ?Cognition Arousal/Alertness: Awake/alert ?Behavior During Therapy: [facility] for tasks assessed/performed, Flat affect ?Overall Cognitive Status: Within Functional Limits for tasks assessed ?  ?  ?  ?  ?  ?  ?  ?  ?  ?  ?  ?  ?  ?  ?  ?  ?General Comments: patient able to follow single step commands with repetition ?  ?  ? ?  ?Exercises General Exercises - Upper Extremity ?Shoulder Flexion: AAROM, Strengthening, 10 reps, Supine (to 90 degrees) ?General Exercises - Lower Extremity ?Ankle Circles/Pumps: Strengthening, Both, 10 reps, Supine (minimal dorsiflexion activity noted, AAROM-PROM) ?Quad Sets: AROM, Strengthening, Both, 5 reps, Supine ?Short Arc Quad: AAROM, Strengthening, 10 reps, Supine ?Heel Slides: AAROM, Strengthening, Both, 10 reps, Supine ?Hip ABduction/ADduction: AAROM, Strengthening, Both, 10 reps, Supine ?Other Exercises ?Other Exercises: verbal and tactile cues for exercise technique for strengthening. no pain is reported with exercises ? ?  ?  General Comments   ?  ?  ? ?Pertinent Vitals/Pain Pain Assessment ?Pain Assessment: No/denies pain  ? ? ?Home Living   ?  ?  ?  ?  ?  ?  ?  ?  ?  ?   ?  ?Prior Function    ?  ?  ?   ? ?PT Goals (current goals can now be found in the care plan section) Acute Rehab PT Goals ?Patient Stated Goal: none stated ?PT Goal Formulation: With patient ?Time For Goal Achievement: 10/22/21 ?Potential to Achieve Goals: Fair ?Progress towards PT goals: Progressing  toward goals (slow progress) ? ?  ?Frequency ? ? ? Min 2X/week ? ? ? ?  ?PT Plan Current plan remains appropriate  ? ? ?Co-evaluation   ?  ?  ?  ?  ? ?  ?AM-PAC PT "6 Clicks" Mobility   ?Outcome Measure ? Help needed turning from your back to your side while in a flat bed without using bedrails?: Total ?Help needed moving from lying on your back to sitting on the side of a flat bed without using bedrails?: Total ?Help needed moving to and from a bed to a chair (including a wheelchair)?: Total ?Help needed standing up from a chair using your arms (e.g., wheelchair or bedside chair)?: Total ?Help needed to walk in hospital room?: Total ?Help needed climbing 3-5 steps with a railing? : Total ?6 Click Score: 6 ? ?  ?End of Session   ?Activity Tolerance: Patient tolerated treatment well ?Patient left: in bed;with call bell/phone within reach;with bed alarm set (prevlon boot in place BLE) ?  ?PT Visit Diagnosis: Other abnormalities of gait and mobility (R26.89);Muscle weakness (generalized) (M62.81);Difficulty in walking, not elsewhere classified (R26.2) ?  ? ? ?Time: 0488-8916 ?PT Time Calculation (min) (ACUTE ONLY): 16 min ? ?Charges:  $Therapeutic Exercise: 8-22 mins          ?          ? ?Donna Bernard, PT, MPT ? ? ? ?[name] ?10/09/2021, 2:49 PM ? ?

## 2021-10-10 ENCOUNTER — Encounter: Payer: Self-pay | Admitting: Family Medicine

## 2021-10-10 DIAGNOSIS — L97412 Non-pressure chronic ulcer of right heel and midfoot with fat layer exposed: Secondary | ICD-10-CM

## 2021-10-10 DIAGNOSIS — R6520 Severe sepsis without septic shock: Secondary | ICD-10-CM | POA: Diagnosis not present

## 2021-10-10 DIAGNOSIS — A419 Sepsis, unspecified organism: Secondary | ICD-10-CM | POA: Diagnosis not present

## 2021-10-10 DIAGNOSIS — Z872 Personal history of diseases of the skin and subcutaneous tissue: Secondary | ICD-10-CM

## 2021-10-10 LAB — BASIC METABOLIC PANEL
Anion gap: 9 (ref 5–15)
BUN: 31 mg/dL — ABNORMAL HIGH (ref 8–23)
CO2: 15 mmol/L — ABNORMAL LOW (ref 22–32)
Calcium: 7.9 mg/dL — ABNORMAL LOW (ref 8.9–10.3)
Chloride: 105 mmol/L (ref 98–111)
Creatinine, Ser: 1.96 mg/dL — ABNORMAL HIGH (ref 0.61–1.24)
GFR, Estimated: 37 mL/min — ABNORMAL LOW (ref >60)
Glucose, Bld: 148 mg/dL — ABNORMAL HIGH (ref 70–99)
Potassium: 3.8 mmol/L (ref 3.5–5.1)
Sodium: 129 mmol/L — ABNORMAL LOW (ref 135–145)

## 2021-10-10 LAB — CBC
HCT: 26.3 % — ABNORMAL LOW (ref 39.0–52.0)
Hemoglobin: 8.1 g/dL — ABNORMAL LOW (ref 13.0–17.0)
MCH: 26.6 pg (ref 26.0–34.0)
MCHC: 30.8 g/dL (ref 30.0–36.0)
MCV: 86.5 fL (ref 80.0–100.0)
Platelets: 162 10*3/uL (ref 150–400)
RBC: 3.04 MIL/uL — ABNORMAL LOW (ref 4.22–5.81)
RDW: 16.6 % — ABNORMAL HIGH (ref 11.5–15.5)
WBC: 9.6 10*3/uL (ref 4.0–10.5)
nRBC: 0 % (ref 0.0–0.2)

## 2021-10-10 LAB — CULTURE, BLOOD (ROUTINE X 2)

## 2021-10-10 LAB — MAGNESIUM: Magnesium: 2.2 mg/dL (ref 1.7–2.4)

## 2021-10-10 NOTE — Progress Notes (Signed)
Physical Therapy Treatment ?Patient Details ?Name: [name] ?MRN: [id number] ?DOB: [birth year] ?Today's Date: 10/10/2021 ? ? ?History of Present Illness Pt is a 66 y/o M admitted on 10/06/21 after presenting with c/o fever, tachycardia & tachypnia. Pt is being treated for severe sepsis & acute metabolic encephalopathy. 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 ?  ?PT Comments  ? ? Pt required encouragement to participate during the session with frequent reports of feeling fearful of falling.  With encouragement pt would agree to participate but soon after would self-limit again requiring encouragement and  motivation with the cycle repeating many times.  Ultimately the pt put forth good effort during the session but was severely limited functionally requiring near total assist with all tasks.  Pt presented with a heavy posterior lean upon initial sitting at the EOB that pt reported was secondary to fear of falling but with extensive anterior weight shifting activities the pt progressed to where he required only occasional min A for stability.  With pt reaching forward to lean on the sink counter for support he was able to unweight his bottom from the mattress but was unable to clear the surface.  Pt will benefit from PT services in a SNF setting upon discharge to safely address deficits listed in patient problem list for decreased caregiver assistance and eventual return to PLOF. ?  ?Recommendations for follow up therapy are one component of a multi-disciplinary discharge planning process, led by the attending physician.  Recommendations may be updated based on patient status, additional functional criteria and insurance authorization. ? ?Follow Up Recommendations ? Skilled nursing-short term rehab (<3 hours/day) ?  ?  ?Assistance  Recommended at Discharge Frequent or constant Supervision/Assistance  ?Patient can return home with the following Two people to help with walking and/or transfers;Two people to help with bathing/dressing/bathroom;Help with stairs or ramp for entrance;Assist for transportation;Direct supervision/assist for medications management;Assistance with cooking/housework;Direct supervision/assist for financial management ?  ?Equipment Recommendations ? Other (comment) (TBD at next level of care)  ?  ?Recommendations for Other Services   ? ? ?  ?Precautions / Restrictions Precautions ?Precautions: Fall ?Restrictions ?Weight Bearing Restrictions: No ?Other Position/Activity Restrictions: wounds on B heels & sacrum  ?  ? ?Mobility ? Bed Mobility ?Overal bed mobility: Needs Assistance ?Bed Mobility: Supine to Sit, Sit to Supine, Rolling ?Rolling: +2 for physical assistance, Max assist ?  ?Supine to sit: Max assist, +2 for physical assistance ?Sit to supine: +2 for physical assistance, Max assist ?  ?General bed mobility comments: Max A for BLE and trunk control ?  ? ?Transfers ?Overall transfer level: Needs assistance ?  ?Transfers: Sit to/from Stand ?Sit to Stand: Total assist ?  ?  ?  ?  ?  ?General transfer comment: Pt able to unweight bottom from mattress but unable to clear the surface of the bed ?  ? ?Ambulation/Gait ?  ?  ?  ?  ?  ?  ?  ?  ? ? ?Stairs ?  ?  ?  ?  ?  ? ? ?Wheelchair Mobility ?  ? ?Modified Rankin (Stroke Patients Only) ?  ? ? ?  ?Balance Overall balance assessment: Needs assistance ?  ?Sitting balance-Leahy Scale: Poor ?Sitting balance - Comments: Mod to max A initially to prevent posterior LOB in sitting but improved to min A after extensive anterior weight shifting activities ?  Postural control: Posterior lean ?  ?  ?Standing balance comment: Unable ?  ?  ?  ?  ?  ?  ?  ?  ?  ?  ?  ?  ? ?  ?Cognition Arousal/Alertness: Awake/alert ?Behavior During Therapy: [facility] for tasks assessed/performed, Flat  affect ?Overall Cognitive Status: Within Functional Limits for tasks assessed ?  ?  ?  ?  ?  ?  ?  ?  ?  ?  ?  ?  ?  ?  ?  ?  ?General Comments: patient able to follow single step commands with repetition ?  ?  ? ?  ?Exercises Other Exercises ?Other Exercises: Static sitting balance training ?Other Exercises: Anterior weight shifting activities in sitting at EOB ? ?  ?General Comments   ?  ?  ? ?Pertinent Vitals/Pain Pain Assessment ?Pain Assessment: No/denies pain  ? ? ?Home Living   ?  ?  ?  ?  ?  ?  ?  ?  ?  ?   ?  ?Prior Function    ?  ?  ?   ? ?PT Goals (current goals can now be found in the care plan section) Progress towards PT goals: Progressing toward goals ? ?  ?Frequency ? ? ? Min 2X/week ? ? ? ?  ?PT Plan Current plan remains appropriate  ? ? ?Co-evaluation   ?  ?  ?  ?  ? ?  ?AM-PAC PT "6 Clicks" Mobility   ?Outcome Measure ? Help needed turning from your back to your side while in a flat bed without using bedrails?: Total ?Help needed moving from lying on your back to sitting on the side of a flat bed without using bedrails?: Total ?Help needed moving to and from a bed to a chair (including a wheelchair)?: Total ?Help needed standing up from a chair using your arms (e.g., wheelchair or bedside chair)?: Total ?Help needed to walk in hospital room?: Total ?Help needed climbing 3-5 steps with a railing? : Total ?6 Click Score: 6 ? ?  ?End of Session Equipment Utilized During Treatment: Gait belt ?Activity Tolerance: Patient tolerated treatment well ?Patient left: in bed;with call bell/phone within reach;with bed alarm set ?Nurse Communication: Mobility status ?PT Visit Diagnosis: Other abnormalities of gait and mobility (R26.89);Muscle weakness (generalized) (M62.81);Difficulty in walking, not elsewhere classified (R26.2) ?  ? ? ?Time: 1093-2355 ?PT Time Calculation (min) (ACUTE ONLY): 32 min ? ?Charges:  $Therapeutic Activity: 23-37 mins          ?          ? ?D. Elly Modena PT, DPT ?10/10/21, 3:45  PM ? ? ?

## 2021-10-10 NOTE — Consult Note (Signed)
? ?Reason for Consult: Bilateral heel ulcers ?Referring Physician: Dr. Myriam Forehand ? ?[name] is an 66 y.o. male.  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  He has been followed by the wound care center and undergoing local wound care.  The right heel is nearly fully healed.  The left heel is still present. ? ?Past Medical History:  ?Diagnosis Date  ? Bipolar disorder (HCC)   ? Diabetes mellitus without complication (HCC)   ? History of blood clots   ? Hypertension   ? TBI (traumatic brain injury) (HCC)   ? ? ?Past Surgical History:  ?Procedure Laterality Date  ? BACK SURGERY    ? CARPAL TUNNEL RELEASE Bilateral   ? COLON SURGERY    ? COLOSTOMY    ? TONSILLECTOMY    ? ? ?Family History  ?Problem Relation Age of Onset  ? Depression Father   ? Deep vein thrombosis Father   ? ? ?Social History:  reports that he has never smoked. He has never been exposed to tobacco smoke. He has never used smokeless tobacco. He reports current alcohol use. He reports that he does not use drugs. ? ?Allergies: No Known Allergies ? ?Medications: I have reviewed the patient's current medications. ? ?Results for orders placed or performed during the hospital encounter of 10/06/21 (from the past 48 hour(s))  ?Phosphorus     Status: None  ? Collection Time: 10/09/21  6:28 AM  ?Result Value Ref Range  ? Phosphorus 3.5 2.5 - 4.6 mg/dL  ?  Comment: Performed at [facility], [address], [place], [place] [address]  ?Magnesium     Status: None  ? Collection Time: 10/09/21  6:28 AM  ?Result Value Ref Range  ? Magnesium 2.1 1.7 - 2.4 mg/dL  ?  Comment: Performed at [facility], [address] [place], [place] [address]  ?Creatinine, serum     Status: Abnormal  ? Collection Time: 10/09/21  6:28 AM  ?Result Value Ref Range  ? Creatinine, Ser 2.32 (H) 0.61 - 1.24 mg/dL  ? GFR, Estimated 30 (L) >60 mL/min  ?  Comment: (NOTE) ?Calculated using the CKD-EPI Creatinine  Equation (2021) ?Performed at [facility], [address] [place], ?[place] [address] ?  ?CBC     Status: Abnormal  ? Collection Time: 10/09/21  6:28 AM  ?Result Value Ref Range  ? WBC 9.9 4.0 - 10.5 K/uL  ? RBC 2.94 (L) 4.22 - 5.81 MIL/uL  ? Hemoglobin 7.6 (L) 13.0 - 17.0 g/dL  ? HCT 24.5 (L) 39.0 - 52.0 %  ? MCV 83.3 80.0 - 100.0 fL  ? MCH 25.9 (L) 26.0 - 34.0 pg  ? MCHC 31.0 30.0 - 36.0 g/dL  ? RDW 16.3 (H) 11.5 - 15.5 %  ? Platelets 160 150 - 400 K/uL  ? nRBC 0.0 0.0 - 0.2 %  ?  Comment: Performed at [facility], [address], [place], [place] [address]  ?Renal function panel     Status: Abnormal  ? Collection Time: 10/09/21  6:28 AM  ?Result Value Ref Range  ? Sodium 128 (L) 135 - 145 mmol/L  ? Potassium 3.8 3.5 - 5.1 mmol/L  ? Chloride 101 98 - 111 mmol/L  ? CO2 18 (L) 22 - 32 mmol/L  ? Glucose, Bld 163 (H) 70 - 99 mg/dL  ?  Comment: Glucose reference range applies only to samples taken after fasting for at least 8  hours.  ? BUN 33 (H) 8 - 23 mg/dL  ? Creatinine, Ser 2.37 (H) 0.61 - 1.24 mg/dL  ? Calcium 7.9 (L) 8.9 - 10.3 mg/dL  ? Phosphorus 3.4 2.5 - 4.6 mg/dL  ? Albumin 2.1 (L) 3.5 - 5.0 g/dL  ? GFR, Estimated 29 (L) >60 mL/min  ?  Comment: (NOTE) ?Calculated using the CKD-EPI Creatinine Equation (2021) ?  ? Anion gap 9 5 - 15  ?  Comment: Performed at [facility], [address] [place], [place] [address]  ?CULTURE, BLOOD (ROUTINE X 2) w Reflex to ID Panel     Status: None (Preliminary result)  ? Collection Time: 10/09/21  6:36 AM  ? Specimen: BLOOD  ?Result Value Ref Range  ? Specimen Description BLOOD RIGHT ASSIST CONTROL   ? Special Requests    ?  BOTTLES DRAWN AEROBIC AND ANAEROBIC Blood Culture adequate volume  ? Culture    ?  NO GROWTH < 24 HOURS ?Performed at [facility], [address] [place], [place] [address] ?  ? Report Status PENDING   ?CULTURE, BLOOD (ROUTINE X 2) w Reflex to ID Panel     Status: None (Preliminary result)  ? Collection Time:  10/09/21  6:37 AM  ? Specimen: BLOOD  ?Result Value Ref Range  ? Specimen Description BLOOD RIGHT FOREARM   ? Special Requests    ?  BOTTLES DRAWN AEROBIC AND ANAEROBIC Blood Culture adequate volume  ? Culture    ?  NO GROWTH < 24 HOURS ?Performed at [facility], [address] [place], [place] [address] ?  ? Report Status PENDING   ?CBC     Status: Abnormal  ? Collection Time: 10/10/21  4:25 AM  ?Result Value Ref Range  ? WBC 9.6 4.0 - 10.5 K/uL  ? RBC 3.04 (L) 4.22 - 5.81 MIL/uL  ? Hemoglobin 8.1 (L) 13.0 - 17.0 g/dL  ? HCT 26.3 (L) 39.0 - 52.0 %  ? MCV 86.5 80.0 - 100.0 fL  ? MCH 26.6 26.0 - 34.0 pg  ? MCHC 30.8 30.0 - 36.0 g/dL  ? RDW 16.6 (H) 11.5 - 15.5 %  ? Platelets 162 150 - 400 K/uL  ? nRBC 0.0 0.0 - 0.2 %  ?  Comment: Performed at [facility], [address] [place], [place] [address]  ?Basic metabolic panel     Status: Abnormal  ? Collection Time: 10/10/21  4:25 AM  ?Result Value Ref Range  ? Sodium 129 (L) 135 - 145 mmol/L  ? Potassium 3.8 3.5 - 5.1 mmol/L  ? Chloride 105 98 - 111 mmol/L  ? CO2 15 (L) 22 - 32 mmol/L  ? Glucose, Bld 148 (H) 70 - 99 mg/dL  ?  Comment: Glucose reference range applies only to samples taken after fasting for at least 8 hours.  ? BUN 31 (H) 8 - 23 mg/dL  ? Creatinine, Ser 1.96 (H) 0.61 - 1.24 mg/dL  ? Calcium 7.9 (L) 8.9 - 10.3 mg/dL  ? GFR, Estimated 37 (L) >60 mL/min  ?  Comment: (NOTE) ?Calculated using the CKD-EPI Creatinine Equation (2021) ?  ? Anion gap 9 5 - 15  ?  Comment: Performed at [facility], [address] [place], [place] [address]  ?Magnesium     Status: None  ? Collection Time: 10/10/21  4:25 AM  ?Result Value Ref Range  ? Magnesium 2.2 1.7 - 2.4 mg/dL  ?  Comment: Performed at [facility], [phone]  [address], [place], [place] [address]  ? ? ?US RENAL ? ?Result Date: 10/09/2021 ?CLINICAL DATA:  Acute kidney injury. EXAM: RENAL / URINARY TRACT ULTRASOUND COMPLETE COMPARISON:  August 05, 2021. FINDINGS: Right Kidney:  Renal measurements: 11.2 x 6.0 x 5.0 cm = volume: 175 mL. Mildly increased echogenicity is noted suggesting medical renal disease. No mass or hydronephrosis visualized. Left Kidney: Renal measurements: 11.7 x 6.3 x 5.0 cm = volume: 190 mL. Mildly increased echogenicity is noted suggesting medical renal disease. No mass or hydronephrosis visualized. Bladder: Decompressed secondary to Foley catheter. Other: None. IMPRESSION: Mildly increased echogenicity of renal parenchyma is noted bilaterally suggesting medical renal disease. No hydronephrosis or renal obstruction is noted. Urinary bladder is decompressed secondary to Foley catheter. Electronically Signed   By: Lupita Raider M.D.   On: 10/09/2021 15:28   ? ?ROS ?Blood pressure 103/61, pulse 82, temperature 97.9 ?F (36.6 ?C), resp. rate 18, height 5\' 6"  (1.676 m), weight 126.6 kg, SpO2 100 %. ? ?Vitals:  ? 10/10/21 1611 10/10/21 1946  ?BP: 130/80 103/61  ?Pulse: 75 82  ?Resp: 18 18  ?Temp: 98 ?F (36.7 ?C) 97.9 ?F (36.6 ?C)  ?SpO2: 99% 100%  ? ? ?General AA&O x3. Normal mood and affect.  ?Vascular Dorsalis pedis and posterior tibial pulses  present 1+ bilaterally  ?Capillary refill normal to all digits. ?Pedal hair growth normal.  ?Neurologic Epicritic sensation grossly absent.  ?Dermatologic (Wound) Stable ulcerations bilateral heels no signs of infection eschar on the left side, right side nearly fully healed.   ?Orthopedic: Motor intact BLE.  ? ? ?Assessment/Plan: ? ?Bilateral heel ulcerations ?-Nearly fully healed on the right side. Stable eschar left heel ?-Will defer wound care recommendations to the wound care nurse, we are not currently managing these outpatient. ?- Do not think ulcers show infection. Doubtful contributing to sepsis ?- No surgical plans. Continue local wound care ?-We will sign off, please call if questions ? ?12/10/21 Tiffay Pinette ?10/10/2021, 8:01 PM  ? ?Best available via secure chat for questions or concerns. ? ? ?

## 2021-10-10 NOTE — TOC Progression Note (Addendum)
Transition of Care (TOC) - Progression Note  ? ? ?Patient Details  ?Name: Paddy Dudding ?MRN: [id number] ?Date of Birth: [birth year] ? ?Transition of Care (TOC) CM/SW Contact  ?Margarito Liner, LCSW ?Phone Number: ?10/10/2021, 8:26 AM ? ?Clinical Narrative: Insurance authorization still pending.   ? ?10:12 am: Auth approved. Auth number hasn't generated yet. Valid 4/12-4/14. ? ?Expected Discharge Plan: Skilled Nursing Facility ?Barriers to Discharge: Continued Medical Work up ? ?Expected Discharge Plan and Services ?Expected Discharge Plan: Skilled Nursing Facility ?  ?  ?Post Acute Care Choice: Skilled Nursing Facility ?Living arrangements for the past 2 months: Single Family Home ?                ?  ?  ?  ?  ?  ?  ?  ?  ?  ?  ? ? ?Social Determinants of Health (SDOH) Interventions ?  ? ?Readmission Risk Interventions ? ?  04/15/2021  ?  2:50 PM  ?Readmission Risk Prevention Plan  ?Transportation Screening Complete  ?PCP or Specialist Appt within 3-5 Days Complete  ?HRI or Home Care Consult Complete  ?Social Work Consult for Recovery Care Planning/Counseling Complete  ?Palliative Care Screening Not Applicable  ?Medication Review Oceanographer) Complete  ? ? ?

## 2021-10-10 NOTE — Progress Notes (Addendum)
? ? ? ?Progress Note  ? ? [name]  [id number] DOB: [birth year]  DOA: 10/06/2021 ?PCP: [name], MD  ? ? ? ? ?Brief Narrative:  ? ? ?Medical records reviewed and are as summarized below: ? ? ? ?Mr. Gilreath is a 66 y.o. M with Bipolar, DM, sedentary due to neuropathy, now with chronic stage IV decub with osteomyelitis, neurogenic bladder with indwelling foley, hx DVT on Eliuqis, chronic bilateral heel wounds with bilateral heel osteomyelitis, CKD IIIb, anemia and recurrent UTI who presented with with confusion for one day noticed by a family member "talking out of his head." ? ?In the ER, Febrile, tachycardic and tachypneic.  Altered.  Unclear source.  EDP suspected flare of one of his osteomyelitis, vs UTI.    ? ? ?4/8: Admitted on antibiotics ?4/10: Blood cultures with MSSA in 2/2 overnight; ID consulted; Cr up to 2.7 ?4/11: BP low, increasing fluids ? ? ? ? ? ?Assessment/Plan:  ? ?Principal Problem: ?  Severe sepsis ([place]) ?Active Problems: ?  MSSA bacteremia ?  Hypotension ?  Chronic anticoagulation - on Eliquis ?  Hypertension ?  Sleep apnea ?  GAD (generalized anxiety disorder) ?  Bipolar disorder, in full remission, most recent episode mixed ([place]) ?  Acute metabolic encephalopathy ?  Obesity, Class III, BMI 40-49.9 (morbid obesity) ([place]) ?  Urinary retention ?  History of DVT (deep vein thrombosis) ?  Stage 3b chronic kidney disease (CKD) (HCC) - baseline SCr 1.8-1.9 ?  Hyponatremia ?  Chronic multifocal osteomyelitis of right foot ([place]) ?  Anemia of chronic disease ?  Ulcer of right heel and midfoot with fat layer exposed ([place]) ?  Chronic indwelling Foley catheter ?  TBI (traumatic brain injury) ([place]) ?  Sacral wound ?  Abdominal distension ?  AKI (acute kidney injury) ([place]) ? ? ?Nutrition Problem: Increased nutrient needs ?Etiology: wound healing ? ?Signs/Symptoms: estimated needs ? ? ?Body mass index is 45.03 kg/m?.  (Morbid obesity) ? ? ?Severe sepsis secondary to MSSA  bacteremia: Blood culture showed MSSA in 2 out of 2 bottles.  No vegetations on 2D echo.  Continue IV cefazolin with plan to complete 6 weeks of IV antibiotics.  Repeat blood culture from 10/09/2021 has not shown any growth thus far.  Plan for PICC line placement tomorrow if blood cultures are still negative. ? ?AKI on CKD stage IIIb: Improving.  Baseline creatinine between 1.4-2.2.  Discontinue IV fluids. ? ?Hypotension: BP is better.  Amlodipine is still on hold. ? ?Abdominal distention: Abdominal x-ray on 10/08/2021 was negative ? ?Acute metabolic encephalopathy: Improved. ? ?Bipolar disorder, general anxiety disorder: Continue psychotropics ? ?Stage IV sacral decubitus ulcer, stage III right heel decubitus ulcer, unstageable left heel decubitus ulcer: Continue local wound care.  Consulted Dr. Leone Brand, podiatrist, for evaluation of decubitus  ulcers on the heels. ? ?Other comorbidities include TBI, chronic indwelling Foley catheter, anemia of chronic disease, chronic hyponatremia, neurogenic bladder with chronic indwelling Foley catheter, history of DVT on Eliquis, colostomy ? ?Diet Order   ? ?       ?  Diet Carb Modified Fluid consistency: Thin; Room service appropriate? Yes  Diet effective now       ?  ? ?  ?  ? ?  ? ? ? ? ? ? ? ? ? ? ?Consultants: ?Infectious disease ? ?Procedures: ?None ? ? ? ?Medications:  ? ? apixaban  5 mg Oral BID  ? ascorbic acid  500 mg Oral BID  ? Chlorhexidine  Gluconate Cloth  6 each Topical Daily  ? cholecalciferol  1,000 Units Oral Daily  ? feeding supplement (GLUCERNA SHAKE)  237 mL Oral BID BM  ? memantine  10 mg Oral BID  ? multivitamin with minerals  1 tablet Oral Daily  ? omega-3 acid ethyl esters  1 capsule Oral Daily  ? Oxcarbazepine  300 mg Oral BID  ? Ensure Max Protein  11 oz Oral QHS  ? QUEtiapine  100 mg Oral QHS  ? rosuvastatin  20 mg Oral QHS  ? sodium hypochlorite   Topical BID  ? vitamin B-12  3,000 mcg Oral Daily  ? zinc sulfate  220 mg Oral Daily  ? ?Continuous  Infusions: ? sodium chloride 100 mL/hr at 10/09/21 1552  ?  ceFAZolin (ANCEF) IV 2 g (10/10/21 [id number])  ? ? ? ?Anti-infectives (From admission, onward)  ? ? Start     Dose/Rate Route Frequency Ordered Stop  ? 10/07/21 2200  ceFAZolin (ANCEF) IVPB 2g/100 mL premix       ? 2 g ?200 mL/hr over 30 Minutes Intravenous Every 8 hours 10/07/21 1226    ? 10/07/21 2100  vancomycin (VANCOCIN) IVPB 1000 mg/200 mL premix  Status:  Discontinued       ? 1,000 mg ?200 mL/hr over 60 Minutes Intravenous Every 24 hours 10/06/21 2239 10/07/21 1226  ? 10/07/21 0900  ceFEPIme (MAXIPIME) 2 g in sodium chloride 0.9 % 100 mL IVPB  Status:  Discontinued       ? 2 g ?200 mL/hr over 30 Minutes Intravenous Every 12 hours 10/06/21 2237 10/07/21 1226  ? 10/06/21 2145  ceFEPIme (MAXIPIME) 2 g in sodium chloride 0.9 % 100 mL IVPB       ? 2 g ?200 mL/hr over 30 Minutes Intravenous  Once 10/06/21 2132 10/06/21 2237  ? 10/06/21 2145  vancomycin (VANCOCIN) IVPB 1000 mg/200 mL premix       ?See Hyperspace for full Linked Orders Report.  ? 1,000 mg ?200 mL/hr over 60 Minutes Intravenous  Once 10/06/21 2132 10/07/21 0222  ? 10/06/21 2145  vancomycin (VANCOREADY) IVPB 1500 mg/300 mL       ?See Hyperspace for full Linked Orders Report.  ? 1,500 mg ?150 mL/hr over 120 Minutes Intravenous  Once 10/06/21 2132 10/07/21 0254  ? ?  ? ? ? ? ? ? ? ? ? ?Family Communication/Anticipated D/C date and plan/Code Status  ? ?DVT prophylaxis: SCDs Start: 10/06/21 2209 ?apixaban (ELIQUIS) tablet 5 mg  ? ?  Code Status: Full Code ? ?Family Communication: None ?Disposition Plan: Plan to discharge to SNF in 1 to 2 days ? ? ?Status is: Inpatient ?Remains inpatient appropriate because: Plan for PICC line placement tomorrow if repeat blood cultures negative, evaluation by podiatrist is pending ? ? ? ? ? ? ?Subjective:  ? ?Interval events noted.  He has no complaints. ? ?Objective:  ? ? ?Vitals:  ? 10/09/21 2348 10/10/21 0436 10/10/21 0737 10/10/21 1155  ?BP: 138/71 123/75 106/66  110/69  ?Pulse: 80 80 79 77  ?Resp: 20 20 18 18   ?Temp: 99 ?F (37.2 ?C) 98.6 ?F (37 ?C) 98 ?F (36.7 ?C) 98.2 ?F (36.8 ?C)  ?TempSrc:    Oral  ?SpO2: 96% 97% 98% 97%  ?Weight:      ?Height:      ? ?No data found. ? ? ?Intake/Output Summary (Last 24 hours) at 10/10/2021 1332 ?Last data filed at 10/10/2021 0900 ?Gross per 24 hour  ?Intake 5329.41 ml  ?Output  1600 ml  ?Net 3729.41 ml  ? ?Filed Weights  ? 10/06/21 1956  ?Weight: 126.6 kg  ? ? ?Exam: ? ?GEN: NAD ?SKIN: Warm and dry.  Stage IV sacral decubitus ulcer.  Unstageable left heel decubitus ulcer, stage III right heel decubitus ulcer ?EYES: EOMI ?ENT: MMM ?CV: RRR ?PULM: CTA B ?ABD: soft, obese, NT, +BS, + colostomy ?CNS: AAO x 2 (person and place), non focal ?EXT: No edema or tenderness ? ? ? ? ?Data Reviewed:  ? ?I have personally reviewed following labs and imaging studies: ? ?Labs: ?Labs show the following:  ? ?Basic Metabolic Panel: ?Recent Labs  ?Lab 10/06/21 ?1956 10/07/21 ?0502 10/08/21 ?[id number] 10/09/21 ?[id number] 10/10/21 ?0425  ?NA 128* 128* 129* 128* 129*  ?K 4.7 4.9 4.4 3.8 3.8  ?CL 100 101 102 101 105  ?CO2 21* 22 19* 18* 15*  ?GLUCOSE 215* 193* 160* 163* 148*  ?BUN 25* 24* 26* 33* 31*  ?CREATININE 1.82* 1.86* 2.72* 2.37*  2.32* 1.96*  ?CALCIUM 8.3* 8.1* 8.1* 7.9* 7.9*  ?MG  --  1.5*  --  2.1 2.2  ?PHOS  --  1.9*  --  3.4  3.5  --   ? ?GFR ?Estimated Creatinine Clearance: 46.6 mL/min (A) (by C-G formula based on SCr of 1.96 mg/dL (H)). ?Liver Function Tests: ?Recent Labs  ?Lab 10/06/21 ?1956 10/09/21 ?[id number]  ?AST 15  --   ?ALT 11  --   ?ALKPHOS 85  --   ?BILITOT 0.6  --   ?PROT 8.1  --   ?ALBUMIN 2.8* 2.1*  ? ?No results for input(s): LIPASE, AMYLASE in the last 168 hours. ?No results for input(s): AMMONIA in the last 168 hours. ?Coagulation profile ?Recent Labs  ?Lab 10/06/21 ?1956  ?INR 1.6*  ? ? ?CBC: ?Recent Labs  ?Lab 10/06/21 ?1956 10/07/21 ?0502 10/08/21 ?[id number] 10/09/21 ?[id number] 10/10/21 ?0425  ?WBC 9.6 9.3 13.8* 9.9 9.6  ?NEUTROABS 7.8* 7.8*  --   --   --    ?HGB 9.1* 9.1* 8.1* 7.6* 8.1*  ?HCT 29.4* 29.1* 25.3* 24.5* 26.3*  ?MCV 85.2 84.3 84.6 83.3 86.5  ?PLT 200 173 165 160 162  ? ?Cardiac Enzymes: ?No results for input(s): CKTOTAL, CKMB, CKMBINDEX, TROPONINI in th

## 2021-10-10 NOTE — Progress Notes (Signed)
? ?Date of Admission:  10/06/2021    ?ID: [name] is a 66 y.o. male  ?Principal Problem: ?  Severe sepsis ([place]) ?Active Problems: ?  Chronic anticoagulation - on Eliquis ?  Hypertension ?  Sleep apnea ?  GAD (generalized anxiety disorder) ?  Bipolar disorder, in full remission, most recent episode mixed ([place]) ?  Acute metabolic encephalopathy ?  Obesity, Class III, BMI 40-49.9 (morbid obesity) ([place]) ?  Urinary retention ?  History of DVT (deep vein thrombosis) ?  Stage 3b chronic kidney disease (CKD) (HCC) - baseline SCr 1.8-1.9 ?  Hyponatremia ?  Chronic multifocal osteomyelitis of right foot ([place]) ?  Anemia of chronic disease ?  Ulcer of right heel and midfoot with fat layer exposed ([place]) ?  Chronic indwelling Foley catheter ?  TBI (traumatic brain injury) ([place]) ?  Sacral wound ?  MSSA bacteremia ?  Abdominal distension ?  AKI (acute kidney injury) ([place]) ?  Hypotension ? ? ? ?Subjective: ?Feeling better ?Says no pain  ? ?Medications:  ? apixaban  5 mg Oral BID  ? ascorbic acid  500 mg Oral BID  ? Chlorhexidine Gluconate Cloth  6 each Topical Daily  ? cholecalciferol  1,000 Units Oral Daily  ? feeding supplement (GLUCERNA SHAKE)  237 mL Oral BID BM  ? memantine  10 mg Oral BID  ? multivitamin with minerals  1 tablet Oral Daily  ? omega-3 acid ethyl esters  1 capsule Oral Daily  ? Oxcarbazepine  300 mg Oral BID  ? Ensure Max Protein  11 oz Oral QHS  ? QUEtiapine  100 mg Oral QHS  ? rosuvastatin  20 mg Oral QHS  ? sodium hypochlorite   Topical BID  ? vitamin B-12  3,000 mcg Oral Daily  ? zinc sulfate  220 mg Oral Daily  ? ? ?Objective: ?Vital signs in last 24 hours: ?Patient Vitals for the past 24 hrs: ? BP Temp Temp src Pulse Resp SpO2  ?10/10/21 1611 130/80 98 ?F (36.7 ?C) Oral 75 18 99 %  ?10/10/21 1155 110/69 98.2 ?F (36.8 ?C) Oral 77 18 97 %  ?10/10/21 0737 106/66 98 ?F (36.7 ?C) -- 79 18 98 %  ?10/10/21 0436 123/75 98.6 ?F (37 ?C) -- 80 20 97 %  ?10/09/21 2348 138/71 99 ?F (37.2 ?C) -- 80 20 96 %   ?10/09/21 1941 109/75 98.7 ?F (37.1 ?C) -- 83 20 94 %  ?  ?PHYSICAL EXAM:  ?General: Alert, , no distress,  ?Head: Normocephalic, without obvious abnormality, atraumatic. ?Lungs: Clear to auscultation bilaterally. No Wheezing or Rhonchi. No rales. ?Heart: Regular rate and rhythm, no murmur, rub or gallop. ?Abdomen: Soft, non-tender,not distended. Bowel sounds normal. No masses ?Extremities: ?Left heel wound covered with eschar ?Skin: sacral wound- base healthy ?Lymph: Cervical, supraclavicular normal. ?Neurologic: paraperesis ?Lab Results ?Recent Labs  ?  10/09/21 ?0628 10/10/21 ?0425  ?WBC 9.9 9.6  ?HGB 7.6* 8.1*  ?HCT 24.5* 26.3*  ?NA 128* 129*  ?K 3.8 3.8  ?CL 101 105  ?CO2 18* 15*  ?BUN 33* 31*  ?CREATININE 2.37*  2.32* 1.96*  ? ?Liver Panel ?Recent Labs  ?  10/09/21 ?0628  ?ALBUMIN 2.1*  ? ?Sedimentation Rate ?No results for input(s): ESRSEDRATE in the last 72 hours. ?C-Reactive Protein ?No results for input(s): CRP in the last 72 hours. ? ?Microbiology: ?4/8 2 of 2 sets MSSA ?4/11 BC- NG ?4/8 UC VRE , MDR pseudomonas ?Studies/Results: ?DG Abd 1 View ? ?Result Date: 10/08/2021 ?CLINICAL DATA:  Abdominal distension. EXAM: ABDOMEN - 1 VIEW COMPARISON:  October 07, 2021 FINDINGS: The bowel gas pattern is normal. No radio-opaque calculi or other significant radiographic abnormality are seen. IMPRESSION: Negative. Electronically Signed   By: Virgina Norfolk M.D.   On: 10/08/2021 19:46  ? ?US RENAL ? ?Result Date: 10/09/2021 ?CLINICAL DATA:  Acute kidney injury. EXAM: RENAL / URINARY TRACT ULTRASOUND COMPLETE COMPARISON:  August 05, 2021. FINDINGS: Right Kidney: Renal measurements: 11.2 x 6.0 x 5.0 cm = volume: 175 mL. Mildly increased echogenicity is noted suggesting medical renal disease. No mass or hydronephrosis visualized. Left Kidney: Renal measurements: 11.7 x 6.3 x 5.0 cm = volume: 190 mL. Mildly increased echogenicity is noted suggesting medical renal disease. No mass or hydronephrosis visualized. Bladder:  Decompressed secondary to Foley catheter. Other: None. IMPRESSION: Mildly increased echogenicity of renal parenchyma is noted bilaterally suggesting medical renal disease. No hydronephrosis or renal obstruction is noted. Urinary bladder is decompressed secondary to Foley catheter. Electronically Signed   By: Marijo Conception M.D.   On: 10/09/2021 15:28   ? ? ?Assessment/Plan: ?MSSA bacteremia- likely source are the wounds-on cefazolin- will need  minimum 6 weeks because of osteomyelitis ?Stage IV sacral wound with chronic osteomyelitis ?Ostomy ?Neurogenic bladder- indwelling foley ?VRE/MDR pseudomonas in urine culture- colonization- changed foley- no treatment needed ? ? ?Left heel wound- recommend podiatry consult for any possible intervention if needed ?Anemia ?  ?AKI on CKD- improving ?  ?H/o DVT on eliquis ?  ?HTN -BP soft ?  ?TBI ? ?Discussed the management with care team ? ?PICC can be placed tomorrow if blood culture remains neg ?  ? ?OPAT Orders ?Diagnosis: ?MSSA( staph aureus bacteremia) with osteomyelitis of sacrum with stage IV sacral wound ?Left heel wound ?Baseline Creatinine  2 ? ? ? ?No Known Allergies ?Discharge antibiotic ?Cefazolin 2 grams Iv every 8 hours until  ?Duration: ?6 weeks  ?End Date: 11/17/21 ? ? ?[facility] Care Per Protocol: ? ?Labs weekly while on IV antibiotics: ?_X_ CBC with differential ? ?_X_ CMP ? ?_X_ ESR ? ? ?_X_ Please pull PIC at completion of IV antibiotics ?_ ? ?Fax weekly labs to [phone] ? ?Clinic Follow Up Appt: 11/08/21 at 9.30 am ?My chart Video Visit ? ? ?Call [phone] with any questions ? ?  ? ?

## 2021-10-10 NOTE — Progress Notes (Signed)
Nutrition Follow-up ? ?DOCUMENTATION CODES:  ? ?Morbid obesity ? ?INTERVENTION:  ? ?-Continue 500 mg vitamin C BID ?-Continue 220 mg zinc sulfate x 14 days ?-Continue Glucerna Shake po BID, each supplement provides 220 kcal and 10 grams of protein  ?-Continue Ensure Max po daily, each supplement provides 150 kcal and 30 grams of protein ?-Continue MVI with minerals daily ? ?NUTRITION DIAGNOSIS:  ? ?Increased nutrient needs related to wound healing as evidenced by estimated needs. ? ?Ongoing ? ?GOAL:  ? ?Patient will meet greater than or equal to 90% of their needs ? ?Progressing  ? ?MONITOR:  ? ?PO intake, Supplement acceptance, Diet advancement, Labs, Weight trends, Skin, I & O's ? ?REASON FOR ASSESSMENT:  ? ?Low [place] ?  ? ?ASSESSMENT:  ? ?Mr. Antowan Vinyard is a 66 year old male with multiple chronic medical diagnosis including chronic neurogenic bladder status post indwelling Foley catheter, chronic sacral wound, chronic bilateral heel wounds, bipolar disorder, chronic osteomyelitis of bilateral heel, history of DVT on anticoagulation with Eliquis, non-insulin-dependent diabetes mellitus, CKD 3B, chronic anemia, debility, history of sepsis secondary to UTI, who presents emergency department from home via EMS for chief concerns of altered mental status per family/partner. ? ?Reviewed I/O's: +3.2 L x 24 hours and +3.4 L since admission ? ?UOP: 1.9 L x 24 hours ? ?Colostomy output: 50 ml x 24 hours  ? ?Pt sleeping at time of visit. He arouse briefly when name was called. He reports feeling better today. He likes Glucerna supplements. Noted pt consumed about 50% of omelette.  ? ?Per TOC notes, plan for SNF placement once medically stable.  ? ?Medications reviewed and include vitamin C, vitamin D3, lovaza, and vitamin B-12, and zinc sulfate.  ? ?Labs reviewed: Na: 129.   ? ?Diet Order:   ?Diet Order   ? ?       ?  Diet Carb Modified Fluid consistency: Thin; Room service appropriate? Yes  Diet effective now        ?  ? ?  ?  ? ?  ? ? ?EDUCATION NEEDS:  ? ?Education needs have been addressed ? ?Skin:  Skin Assessment: Skin Integrity Issues: ?Skin Integrity Issues:: Stage IV, Stage III, Unstageable ?Stage III: rt heel ?Stage IV: sacrum ?Unstageable: lt heel ? ?Last BM:  4/412/3 (via colostomy) ? ?Height:  ? ?Ht Readings from Last 1 Encounters:  ?10/06/21 5\' 6"  (1.676 m)  ? ? ?Weight:  ? ?Wt Readings from Last 1 Encounters:  ?10/06/21 126.6 kg  ? ? ?Ideal Body Weight:  64.5 kg ? ?BMI:  Body mass index is 45.03 kg/m?. ? ?Estimated Nutritional Needs:  ? ?Kcal:  2050-2250 ? ?Protein:  110-125 grams ? ?Fluid:  > 2 L ? ? ? ?Loistine Chance, RD, LDN, CDCES ?Registered Dietitian II ?Certified Diabetes Care and Education Specialist ?Please refer to [facility] for RD and/or RD on-call/weekend/after hours pager  ?

## 2021-10-10 NOTE — Care Management Important Message (Signed)
Important Message ? ?Patient Details  ?Name: [name] ?MRN: [id number] ?Date of Birth: June 11, 1956 ? ? ?Medicare Important Message Given:  Yes ? ?Reviewed Medicare IM with Tiyon Roorda, spouse, at [phone].  Copy of Medicare IM sent securely to email address provided: [email or web address].  ? ? ? ? ?Dannette Barbara ?10/10/2021, 11:57 AM ?

## 2021-10-11 ENCOUNTER — Inpatient Hospital Stay: Payer: Medicare Other | Admitting: Radiology

## 2021-10-11 DIAGNOSIS — B9561 Methicillin susceptible Staphylococcus aureus infection as the cause of diseases classified elsewhere: Secondary | ICD-10-CM | POA: Diagnosis not present

## 2021-10-11 DIAGNOSIS — R7881 Bacteremia: Secondary | ICD-10-CM

## 2021-10-11 DIAGNOSIS — R6520 Severe sepsis without septic shock: Secondary | ICD-10-CM | POA: Diagnosis not present

## 2021-10-11 DIAGNOSIS — A419 Sepsis, unspecified organism: Secondary | ICD-10-CM | POA: Diagnosis not present

## 2021-10-11 HISTORY — PX: IR US GUIDE VASC ACCESS RIGHT: IMG2390

## 2021-10-11 HISTORY — PX: IR PERC TUN PERIT CATH WO PORT S&I /IMAG: IMG2327

## 2021-10-11 LAB — CBC WITH DIFFERENTIAL/PLATELET
Abs Immature Granulocytes: 0.07 10*3/uL (ref 0.00–0.07)
Basophils Absolute: 0 10*3/uL (ref 0.0–0.1)
Basophils Relative: 0 %
Eosinophils Absolute: 0.2 10*3/uL (ref 0.0–0.5)
Eosinophils Relative: 2 %
HCT: 25.9 % — ABNORMAL LOW (ref 39.0–52.0)
Hemoglobin: 8.3 g/dL — ABNORMAL LOW (ref 13.0–17.0)
Immature Granulocytes: 1 %
Lymphocytes Relative: 11 %
Lymphs Abs: 1 10*3/uL (ref 0.7–4.0)
MCH: 26.3 pg (ref 26.0–34.0)
MCHC: 32 g/dL (ref 30.0–36.0)
MCV: 82.2 fL (ref 80.0–100.0)
Monocytes Absolute: 0.9 10*3/uL (ref 0.1–1.0)
Monocytes Relative: 10 %
Neutro Abs: 6.9 10*3/uL (ref 1.7–7.7)
Neutrophils Relative %: 76 %
Platelets: 192 10*3/uL (ref 150–400)
RBC: 3.15 MIL/uL — ABNORMAL LOW (ref 4.22–5.81)
RDW: 16.5 % — ABNORMAL HIGH (ref 11.5–15.5)
WBC: 9.1 10*3/uL (ref 4.0–10.5)
nRBC: 0 % (ref 0.0–0.2)

## 2021-10-11 LAB — BASIC METABOLIC PANEL
Anion gap: 6 (ref 5–15)
BUN: 28 mg/dL — ABNORMAL HIGH (ref 8–23)
CO2: 16 mmol/L — ABNORMAL LOW (ref 22–32)
Calcium: 8.2 mg/dL — ABNORMAL LOW (ref 8.9–10.3)
Chloride: 107 mmol/L (ref 98–111)
Creatinine, Ser: 1.64 mg/dL — ABNORMAL HIGH (ref 0.61–1.24)
GFR, Estimated: 46 mL/min — ABNORMAL LOW (ref >60)
Glucose, Bld: 182 mg/dL — ABNORMAL HIGH (ref 70–99)
Potassium: 3.7 mmol/L (ref 3.5–5.1)
Sodium: 129 mmol/L — ABNORMAL LOW (ref 135–145)

## 2021-10-11 MED ORDER — LIDOCAINE-EPINEPHRINE 1 %-1:100000 IJ SOLN
INTRAMUSCULAR | Status: AC
  Administered 2021-10-11: 5 mL
  Filled 2021-10-11: qty 1

## 2021-10-11 MED ORDER — CEFAZOLIN IV (FOR PTA / DISCHARGE USE ONLY): 2.0000 g | Freq: Three times a day (TID) | INTRAVENOUS | 0 refills | Status: AC

## 2021-10-11 MED ORDER — HEPARIN SOD (PORK) LOCK FLUSH 100 UNIT/ML IV SOLN
INTRAVENOUS | Status: AC
  Administered 2021-10-11: 500 [IU]
  Filled 2021-10-11: qty 5

## 2021-10-11 NOTE — Progress Notes (Signed)
PT Cancellation Note ? ?Patient Details ?Name: Emron Dreisbach ?MRN: [id number] ?DOB: [birth year] ? ? ?Cancelled Treatment:     Therapist in to see pt x 2 today. Pt declined stating he worked hard the previous day and couldn't tolerate any more activity. Therapist provided education regarding importance and benefits of functional mobility and strengthening. Pt continued to decline.  Continue per POC next available date/time ? ? ?Josie Dixon ?10/11/2021, 11:33 AM ?

## 2021-10-11 NOTE — Plan of Care (Signed)
Ongoing care at SNF ?

## 2021-10-11 NOTE — TOC Transition Note (Signed)
Transition of Care (TOC) - CM/SW Discharge Note ? ? ?Patient Details  ?Name: [name] ?MRN: [id number] ?Date of Birth: [birth year] ? ?Transition of Care (TOC) CM/SW Contact:  ?Candie Chroman, LCSW ?Phone Number: ?10/11/2021, 2:54 PM ? ? ?Clinical Narrative:   Patient has orders to discharge to [facility] today. RN will call report to [phone] (Room 506). EMS transport set up for 3:30 pm. No further concerns. CSW signing off. ? ?Final next level of care: [place] ?Barriers to Discharge: Barriers Resolved ? ? ?Patient Goals and CMS Choice ?  ?  ?Choice offered to / list presented to : Patient, Spouse ? ?Discharge Placement ?  ?Existing PASRR number confirmed : 10/08/21          ?Patient [facility] bed at: [facility] ?Patient to be transferred to facility by: EMS ?Name of family member notified: [name] Sarvis ?Patient and family notified of of transfer: 10/11/21 ? ?Discharge Plan and Services ?  ?  ?Post Acute Care Choice: [place]          ?  ?  ?  ?  ?  ?  ?  ?  ?  ?  ? ?Social Determinants of Health (SDOH) Interventions ?  ? ? ?Readmission Risk Interventions ? ?  04/15/2021  ?  2:50 PM  ?Readmission Risk Prevention Plan  ?Transportation Screening Complete  ?PCP or Specialist Appt within 3-5 Days Complete  ?[place] or Home Care Consult Complete  ?Social Work Consult for [place] Planning/Counseling Complete  ?Palliative Care Screening Not Applicable  ?Medication Review Press photographer) Complete  ? ? ? ? ? ?

## 2021-10-11 NOTE — Progress Notes (Signed)
PHARMACY CONSULT NOTE FOR: ? ?OUTPATIENT  PARENTERAL ANTIBIOTIC THERAPY (OPAT) ? ?Indication: Bacteremia with Osteo ?Regimen: Cefazolin 2g IV q8h ?End date: 11/17/21 ? ?IV antibiotic discharge orders are pended. ?To discharging provider:  please sign these orders via discharge navigator,  ?Select New Orders & click on the button choice - Manage This Unsigned Work.  ?  ? ?Thank you for allowing pharmacy to be a part of this patient's care. ? ?Shirlee More, PharmD ?PGY2 Infectious Diseases Pharmacy Resident ?  ?Please check [email or web address] for unit-specific pharmacy phone numbers  ?

## 2021-10-11 NOTE — Discharge Summary (Addendum)
? ?Physician Discharge Summary  ?[name] [id number] DOB: 1956-06-19 DOA: 10/06/2021 ? ?PCP: [name] Nian, MD ? ?Admit date: 10/06/2021 ?Discharge date: 10/11/2021 ? ?Discharge disposition: SNF ? ? ?Recommendations for Outpatient Follow-Up:  ? ?Follow-up with physician at the nursing home within 3 days of discharge ? ? ?Discharge Diagnosis:  ? ?Principal Problem: ?  Severe sepsis ([name]) ?Active Problems: ?  MSSA bacteremia ?  Hypotension ?  Chronic anticoagulation - on Eliquis ?  Hypertension ?  Sleep apnea ?  GAD (generalized anxiety disorder) ?  Bipolar disorder, in full remission, most recent episode mixed ([name]) ?  Acute metabolic encephalopathy ?  Obesity, Class III, BMI 40-49.9 (morbid obesity) ([name]) ?  Urinary retention ?  History of DVT (deep vein thrombosis) ?  Stage 3b chronic kidney disease (CKD) ([name]) - baseline SCr 1.8-1.9 ?  Hyponatremia ?  Chronic multifocal osteomyelitis of right foot ([name]) ?  Anemia of chronic disease ?  Healed ulcer of foot on examination ?  Chronic indwelling Foley catheter ?  TBI (traumatic brain injury) ([name]) ?  Sacral wound ?  Abdominal distension ?  AKI (acute kidney injury) ([name]) ? ? ? ?Discharge Condition: Stable. ? ?Diet recommendation:  ?Diet Order   ? ?       ?  Diet Carb Modified Fluid consistency: Thin; Room service appropriate? Yes  Diet effective now       ?  ?  Diet - low sodium heart healthy       ?  ?  Diet general       ?  ? ?  ?  ? ?  ? ? ?  Code Status: Full Code ? ? ? ? ?Hospital Course:  ? ?  ?[name] is a 66 year old male with bipolar disorder, anxiety, diabetes mellitus, sedentary due to neuropathy, chronic stage IV decub with osteomyelitis, neurogenic bladder with indwelling foley, hx DVT on Eliuqis, chronic bilateral heel wounds with bilateral heel osteomyelitis, CKD IIIb, anemia and recurrent UTI who presented with with confusion for one day noticed by a family member "talking out of his head." ?  ?In the ER, he was confused,  febrile, tachycardic and tachypneic.  He was found to have severe sepsis secondary to MSSA bacteremia.  2D echo did not show any vegetations.  He was treated with IV fluids and empiric IV antibiotics.  ID specialist recommended IV cefazolin at discharge through 11/17/2021.  He also had AKI and hypotension that improved with IV fluids.  IR was consulted for tunneled central venous catheter placement for long-term IV antibiotics.  His condition has improved and he is deemed stable for discharge to SNF today. ?  ?  ? ? ?Medical Consultants:  ? ?ID specialist ?Interventional radiologist ? ? ?Discharge Exam:  ? ? ?Vitals:  ? 10/10/21 2337 10/11/21 0317 10/11/21 0731 10/11/21 1137  ?BP: 108/73 111/70 137/74 119/76  ?Pulse: 77 75 75 79  ?Resp: 18 18 17 17   ?Temp: 98.1 ?F (36.7 ?C) 98.7 ?F (37.1 ?C) 98.3 ?F (36.8 ?C) 98.2 ?F (36.8 ?C)  ?TempSrc:      ?SpO2: 97% 97% 98% 100%  ?Weight:      ?Height:      ? ? ? ?GEN: NAD ?SKIN: Warm and dry.  Stage IV sacral decubitus ulcer, unstageable left heel decubitus ulcer, stage III right heel decubitus ulcer ?EYES: No pallor or icterus ?ENT: MMM ?CV: RRR ?PULM: CTA B ?ABD: soft, ND, NT, +BS, + colostomy ?CNS: AAO x 2 (person and place), non  focal ?EXT: No edema or tenderness ?GU: Foley catheter draining amber urine ? ? ?The results of significant diagnostics from this hospitalization (including imaging, microbiology, ancillary and laboratory) are listed below for reference.   ? ? ?Procedures and Diagnostic Studies:  ? ?DG Abd 1 View ? ?Result Date: 10/08/2021 ?CLINICAL DATA:  Abdominal distension. EXAM: ABDOMEN - 1 VIEW COMPARISON:  October 07, 2021 FINDINGS: The bowel gas pattern is normal. No radio-opaque calculi or other significant radiographic abnormality are seen. IMPRESSION: Negative. Electronically Signed   By: Virgina Norfolk M.D.   On: 10/08/2021 19:46  ? ?DG Abd 1 View ? ?Result Date: 10/07/2021 ?CLINICAL DATA:  Vomiting EXAM: ABDOMEN - 1 VIEW COMPARISON:  None. FINDINGS: The  bowel gas pattern is normal. No radio-opaque calculi or other significant radiographic abnormality are seen. IMPRESSION: Negative. Electronically Signed   By: Ulyses Jarred M.D.   On: 10/07/2021 22:27  ? ?ECHOCARDIOGRAM COMPLETE ? ?Result Date: 10/07/2021 ?   ECHOCARDIOGRAM REPORT   Patient Name:   [name] Date of Exam: 10/07/2021 Medical Rec #:  [id number]             Height:       66.0 in Accession #:    [id number]            Weight:       279.0 lb Date of Birth:  [birth year]            BSA:          2.304 m? Patient Age:    2 years              BP:           130/62 mmHg Patient Gender: M                     HR:           99 bpm. Exam Location:  [facility] Procedure: 2D Echo Indications:     Bacteremia R78.81  History:         Patient has no prior history of Echocardiogram examinations.  Sonographer:     Kathlen Brunswick RDCS Referring Phys:  [id number] Suann Larry DANFORD Diagnosing Phys: Neoma Laming  Sonographer Comments: Technically challenging study due to limited acoustic windows. Patient tremors. IMPRESSIONS  1. Left ventricular ejection fraction, by estimation, is 60 to 65%. The left ventricle has normal function. The left ventricle has no regional wall motion abnormalities. Left ventricular diastolic parameters are consistent with Grade III diastolic dysfunction (restrictive).  2. Right ventricular systolic function is normal. The right ventricular size is normal.  3. Left atrial size was mildly dilated.  4. The mitral valve is normal in structure. No evidence of mitral valve regurgitation. No evidence of mitral stenosis.  5. The aortic valve is normal in structure. Aortic valve regurgitation is mild. Aortic valve sclerosis is present, with no evidence of aortic valve stenosis.  6. The inferior vena cava is normal in size with greater than 50% respiratory variability, suggesting right atrial pressure of 3 mmHg. FINDINGS  Left Ventricle: Left ventricular ejection fraction, by estimation, is 60 to 65%.  The left ventricle has normal function. The left ventricle has no regional wall motion abnormalities. The left ventricular internal cavity size was normal in size. There is  no left ventricular hypertrophy. Left ventricular diastolic parameters are consistent with Grade III diastolic dysfunction (restrictive). Right Ventricle: The right ventricular size is normal. No increase in right ventricular wall thickness. Right  ventricular systolic function is normal. Left Atrium: Left atrial size was mildly dilated. Right Atrium: Right atrial size was normal in size. Pericardium: There is no evidence of pericardial effusion. Mitral Valve: The mitral valve is normal in structure. No evidence of mitral valve regurgitation. No evidence of mitral valve stenosis. Tricuspid Valve: The tricuspid valve is normal in structure. Tricuspid valve regurgitation is not demonstrated. No evidence of tricuspid stenosis. Aortic Valve: The aortic valve is normal in structure. Aortic valve regurgitation is mild. Aortic valve sclerosis is present, with no evidence of aortic valve stenosis. Aortic valve peak gradient measures 4.0 mmHg. Pulmonic Valve: The pulmonic valve was normal in structure. Pulmonic valve regurgitation is trivial. No evidence of pulmonic stenosis. Aorta: The aortic root is normal in size and structure. Venous: The inferior vena cava is normal in size with greater than 50% respiratory variability, suggesting right atrial pressure of 3 mmHg. IAS/Shunts: No atrial level shunt detected by color flow Doppler.  LEFT VENTRICLE PLAX 2D LVIDd:         4.95 cm LVIDs:         3.42 cm LV PW:         1.03 cm LV IVS:        0.98 cm LVOT diam:     2.40 cm LVOT Area:     4.52 cm?  LEFT ATRIUM         Index LA diam:    2.50 cm 1.09 cm/m?  AORTIC VALVE AV Vmax:      100.00 cm/s AV Peak Grad: 4.0 mmHg  AORTA Ao Root diam: 3.20 cm MITRAL VALVE MV Area (PHT): 3.72 cm?    SHUNTS MV Decel Time: 204 msec    Systemic Diam: 2.40 cm MV E velocity: 62.10  cm/s MV A velocity: 64.30 cm/s MV E/A ratio:  0.97 Shaukat Khan Electronically signed by Neoma Laming Signature Date/Time: 10/07/2021/3:04:01 PM    Final   ? ? ? ?Labs:  ? ?Basic Metabolic Panel: ?Recent Labs

## 2021-10-11 NOTE — Plan of Care (Signed)
°  Problem: Health Behavior/Discharge Planning: °Goal: Ability to manage health-related needs will improve °Outcome: Progressing °  °Problem: Clinical Measurements: °Goal: Diagnostic test results will improve °Outcome: Progressing °  °Problem: Pain Managment: °Goal: General experience of comfort will improve °Outcome: Progressing °  °

## 2021-10-14 LAB — CULTURE, BLOOD (ROUTINE X 2)
Culture: NO GROWTH
Culture: NO GROWTH
Special Requests: ADEQUATE
Special Requests: ADEQUATE

## 2021-11-08 ENCOUNTER — Ambulatory Visit: Payer: Medicare Other | Attending: Infectious Diseases | Admitting: Infectious Diseases

## 2021-11-08 DIAGNOSIS — R7881 Bacteremia: Secondary | ICD-10-CM | POA: Insufficient documentation

## 2021-11-08 DIAGNOSIS — B9561 Methicillin susceptible Staphylococcus aureus infection as the cause of diseases classified elsewhere: Secondary | ICD-10-CM | POA: Diagnosis not present

## 2021-11-08 DIAGNOSIS — E1140 Type 2 diabetes mellitus with diabetic neuropathy, unspecified: Secondary | ICD-10-CM | POA: Diagnosis not present

## 2021-11-08 DIAGNOSIS — Z960 Presence of urogenital implants: Secondary | ICD-10-CM | POA: Insufficient documentation

## 2021-11-08 DIAGNOSIS — Z95828 Presence of other vascular implants and grafts: Secondary | ICD-10-CM | POA: Diagnosis not present

## 2021-11-08 DIAGNOSIS — S91302A Unspecified open wound, left foot, initial encounter: Secondary | ICD-10-CM

## 2021-11-08 DIAGNOSIS — N312 Flaccid neuropathic bladder, not elsewhere classified: Secondary | ICD-10-CM | POA: Insufficient documentation

## 2021-11-08 DIAGNOSIS — L89629 Pressure ulcer of left heel, unspecified stage: Secondary | ICD-10-CM | POA: Insufficient documentation

## 2021-11-08 DIAGNOSIS — L89154 Pressure ulcer of sacral region, stage 4: Secondary | ICD-10-CM | POA: Insufficient documentation

## 2021-11-08 DIAGNOSIS — M868X8 Other osteomyelitis, other site: Secondary | ICD-10-CM | POA: Diagnosis not present

## 2021-11-08 NOTE — Progress Notes (Signed)
The purpose of this virtual visit is to provide medical care while limiting exposure to the novel coronavirus (COVID19) for both patient and office staff. ?  ?Consent was obtained for phone visit:  Yes.   ?Answered questions that patient had about telehealth interaction:  Yes.   ?I discussed the limitations, risks, security and privacy concerns of performing an evaluation and management service by telephone. I also discussed with the patient that there may be a patient responsible charge related to this service. The patient expressed understanding and agreed to proceed. ?  ?Patient Location: Home ?Provider Location: Office ?People on call wife on her phone at work ?Patient at home ?Provider and CMA ?This is a follow-up call to evaluate for MSSA bacteremia and osteomyelitis of sacrum stage IV sacral wound the left heel wound.  Patient was recently hospitalized between 4/8-4/13/23 for confusion and fever ?He had MSSA bacteremia.  He has a history of diabetes mellitus, neurogenic bladder with indwelling Foley, sedentary due to neuropathy, stage IV sacral decubitus with osteomyelitis for which he was treated twice with antibiotics in the past years  ?Stage IV sacral decubitus looked clean ?He had left heel wound covered with eschar.  He was seen by podiatrist who did not recommend any intervention.  He was sent home with IV cefazolin to complete 6 weeks after event.  He had to get up central line for  IV antibiotics.  He will complete 6 weeks on 11/17/2021. ?He states he is doing fine ?No fever or chills ?The wife states the wound is looking good ?The left heel has eschar ?He is to follow-up with wound clinic regarding topical treatment ?We will make arrangements with interventional radiologist to remove the central line after completion of antibiotic. ?Follow-up as needed ?Discussed the management with the patient his wife in detail ?Time spent 14 minutes. ?

## 2021-11-09 ENCOUNTER — Telehealth: Payer: Self-pay

## 2021-11-09 ENCOUNTER — Other Ambulatory Visit: Payer: Self-pay | Admitting: Infectious Diseases

## 2021-11-09 DIAGNOSIS — B9561 Methicillin susceptible Staphylococcus aureus infection as the cause of diseases classified elsewhere: Secondary | ICD-10-CM

## 2021-11-09 NOTE — Telephone Encounter (Signed)
Patient schedule [facility] IR to have central line removed on 11/19/21 @ 10:30 pm. Patient wife advised of appointment date and time. Patient's wife verbalized understanding. ?Colbin Jovel T Fidelis Loth ? ?

## 2021-11-09 NOTE — Progress Notes (Signed)
Order placed for removal of central line on 11/19/21 ? ? ?

## 2021-11-14 ENCOUNTER — Telehealth: Payer: Self-pay

## 2021-11-14 NOTE — Telephone Encounter (Signed)
Karoline Caldwell [phone]) with patient home health agency called and left a voicemail with critical lab value.  ?Angie also stated she believes that [facility] may have lost the patient's lavender tube for his CBC.  ?Patient calcium 6.8 and CR 1.2. Per Dr. Rivka Safer she has spoke with the nurse and advised her to get an ionized calcium level on the patient.  ?Danique Hartsough T Teddie Curd ? ?

## 2021-11-19 ENCOUNTER — Ambulatory Visit
Admission: RE | Admit: 2021-11-19 | Discharge: 2021-11-19 | Disposition: A | Discharge status: Home / Self Care | Payer: Medicare Other | Source: Ambulatory Visit | Attending: Infectious Diseases | Admitting: Infectious Diseases

## 2021-11-19 DIAGNOSIS — B9561 Methicillin susceptible Staphylococcus aureus infection as the cause of diseases classified elsewhere: Secondary | ICD-10-CM | POA: Insufficient documentation

## 2021-11-19 DIAGNOSIS — R7881 Bacteremia: Secondary | ICD-10-CM | POA: Diagnosis present

## 2021-11-19 HISTORY — PX: IR REMOVAL TUN CV CATH W/O FL: IMG2289

## 2021-11-19 MED ORDER — LIDOCAINE HCL 1 % IJ SOLN
INTRAMUSCULAR | Status: AC
  Administered 2021-11-19: 3 mL
  Filled 2021-11-19: qty 20

## 2021-11-19 NOTE — Procedures (Signed)
PROCEDURE SUMMARY:  Successful removal of right IJ tunneled CVC.  No immediate complications.  Pt tolerated well.   EBL None.  Pattricia Boss D PA-C 11/19/2021 12:21 PM

## 2021-11-22 ENCOUNTER — Encounter: Payer: Medicare Other | Attending: Physician Assistant | Admitting: Physician Assistant

## 2021-11-22 DIAGNOSIS — Z86718 Personal history of other venous thrombosis and embolism: Secondary | ICD-10-CM | POA: Diagnosis not present

## 2021-11-22 DIAGNOSIS — L89154 Pressure ulcer of sacral region, stage 4: Secondary | ICD-10-CM | POA: Insufficient documentation

## 2021-11-22 DIAGNOSIS — L89613 Pressure ulcer of right heel, stage 3: Secondary | ICD-10-CM | POA: Insufficient documentation

## 2021-11-22 DIAGNOSIS — L89623 Pressure ulcer of left heel, stage 3: Secondary | ICD-10-CM | POA: Diagnosis not present

## 2021-11-22 DIAGNOSIS — Z87820 Personal history of traumatic brain injury: Secondary | ICD-10-CM | POA: Diagnosis not present

## 2021-11-22 DIAGNOSIS — L98412 Non-pressure chronic ulcer of buttock with fat layer exposed: Secondary | ICD-10-CM | POA: Insufficient documentation

## 2021-11-22 DIAGNOSIS — M6281 Muscle weakness (generalized): Secondary | ICD-10-CM | POA: Insufficient documentation

## 2021-11-22 DIAGNOSIS — F319 Bipolar disorder, unspecified: Secondary | ICD-10-CM | POA: Insufficient documentation

## 2021-11-22 DIAGNOSIS — Z7901 Long term (current) use of anticoagulants: Secondary | ICD-10-CM | POA: Insufficient documentation

## 2021-11-22 DIAGNOSIS — E11622 Type 2 diabetes mellitus with other skin ulcer: Secondary | ICD-10-CM | POA: Diagnosis not present

## 2021-11-22 DIAGNOSIS — L24A0 Irritant contact dermatitis due to friction or contact with body fluids, unspecified: Secondary | ICD-10-CM | POA: Diagnosis not present

## 2021-11-22 DIAGNOSIS — I10 Essential (primary) hypertension: Secondary | ICD-10-CM | POA: Insufficient documentation

## 2021-11-22 DIAGNOSIS — E1140 Type 2 diabetes mellitus with diabetic neuropathy, unspecified: Secondary | ICD-10-CM | POA: Insufficient documentation

## 2021-11-22 NOTE — Progress Notes (Signed)
ANDERW, CHO ([id number]) Visit Report for 11/22/2021 Abuse Risk Screen Details Patient Name: [name] Date of Service: 11/22/2021 8:45 AM Medical Record Number: [id number] Patient Account Number: [id number] Date of Birth/Sex: March 18, 1956 (66 y.o. M) Treating RN: [name] Primary Care [name]: [name] Other Clinician: Referring [name]: [name] Treating Kymari Nuon/Extender: [name] in Treatment: 0 Abuse Risk Screen Items Answer ABUSE RISK SCREEN: Has anyone close to you tried to hurt or harm you recentlyo No Do you feel uncomfortable with anyone in your familyo No Has anyone forced you do things that you didnot want to doo No Electronic Signature(s) Signed: 11/22/2021 2:19:12 PM By: [name] Coria RN Entered By: [name] on 11/22/2021 09:03:36 Kehm, [name] ([id number]) -------------------------------------------------------------------------------- Activities of Daily Living Details Patient Name: [name] Date of Service: 11/22/2021 8:45 AM Medical Record Number: [id number] Patient Account Number: [id number] Date of Birth/Sex: [birth year] (66 y.o. M) Treating RN: [name] Primary Care Tex Conroy: [name] Other Clinician: Referring [name]: [name] Treating [name]/Extender: [name] in Treatment: 0 Activities of Daily Living Items Answer Activities of Daily Living (Please select one for each item) Drive Automobile Not Able Take Medications Need Assistance Use Telephone Completely Able Care for Appearance Need Assistance Use Toilet Need Assistance Bath / Shower Need Assistance Dress Self Need Assistance Feed Self Completely Able Walk Need Assistance Get In / Out Bed Need Assistance Housework Not Able Prepare Meals Not Able Handle Money Need Assistance Shop for Self Not Able Electronic Signature(s) Signed: 11/22/2021 2:19:12 PM By: [name] Coria RN Entered By: [name] on 11/22/2021 09:04:46 Magistro, [name] ([id number]) -------------------------------------------------------------------------------- Education Screening Details Patient Name: [name] Date of Service: 11/22/2021 8:45 AM Medical Record Number: [id number] Patient Account Number: [id number] Date of Birth/Sex: [birth year] (66 y.o. M) Treating RN: [name] Primary Care [name]: [name] Other Clinician: Referring [name]: [name] Treating [name]: [name] in Treatment: 0 Primary Learner Assessed: Patient Learning Preferences/Education Level/Primary Language Learning Preference: Explanation Highest Education Level: College or Above Preferred Language: English Cognitive Barrier Language Barrier: No Translator Needed: No Memory Deficit: No Emotional Barrier: No Cultural/Religious Beliefs Affecting Medical Care: No Physical Barrier Impaired Vision: No Impaired Hearing: No Decreased Hand dexterity: No Knowledge/Comprehension Knowledge Level: Medium Comprehension Level: High Ability to understand written instructions: High Ability to understand verbal instructions: High Motivation Anxiety Level: Anxious Cooperation: Cooperative Education Importance: Acknowledges Need Interest in Health Problems: Asks Questions Perception: Coherent Willingness to Engage in Self-Management High Activities: Readiness to Engage in Self-Management High Activities: Electronic Signature(s) Signed: 11/22/2021 2:19:12 PM By: [name] Coria RN Entered By: [name] on 11/22/2021 09:05:15 Wahlstrom, [name] ([id number]) -------------------------------------------------------------------------------- Fall Risk Assessment Details Patient Name: [name] Date of Service: 11/22/2021 8:45 AM Medical Record Number: [id number] Patient Account Number: [id number] Date of Birth/Sex: 15-Dec-1955 (66 y.o. M) Treating RN: [name] Primary Care [name]: [name] Other Clinician: Referring [name]: [name] Treating [name]/Extender: [name] in Treatment: 0 Fall Risk Assessment Items Have you had 2 or more falls in the last 12 monthso 0 No Have you had any fall that resulted in injury in the last 12 monthso 0 No FALLS RISK SCREEN History of falling - immediate or within 3 months 0 No Secondary diagnosis (Do you have 2 or more medical diagnoseso) 0 No Ambulatory aid None/bed rest/wheelchair/nurse 0 No Crutches/cane/walker 0 No Furniture 0 No Intravenous therapy Access/Saline/Heparin Lock 0 No Gait/Transferring Normal/ bed rest/ wheelchair 0 No Weak (short steps with or without shuffle, stooped but able to lift  head while walking, may 0 No seek support from furniture) Impaired (short steps with shuffle, may have difficulty arising from chair, head down, impaired 0 No balance) Mental Status Oriented to own ability 0 No Electronic Signature(s) Signed: 11/22/2021 2:19:12 PM By: [name] Coria RN Entered By: [name] on 11/22/2021 09:05:27 Tinkle, [name] ([id number]) -------------------------------------------------------------------------------- Foot Assessment Details Patient Name: [name] Date of Service: 11/22/2021 8:45 AM Medical Record Number: [id number] Patient Account Number: [id number] Date of Birth/Sex: 04-Jan-1956 (66 y.o. M) Treating RN: [name] Primary Care [name]: [name] Other Clinician: Referring [name]: [name] Treating [name]: [name] in Treatment: 0 Foot Assessment Items Site Locations + = Sensation present, - = Sensation absent, C = Callus, U = Ulcer R = Redness, W = Warmth, M = Maceration, PU = Pre-ulcerative lesion F = Fissure, S = Swelling, D = Dryness Assessment Right: Left: Other Deformity: No No Prior Foot Ulcer: No No Prior Amputation: No No Charcot Joint: No  No Ambulatory Status: Ambulatory With Help Assistance Device: Walker Gait: Administrator, arts) Signed: 11/22/2021 2:19:12 PM By: [name] Coria RN Entered By: [name] on 11/22/2021 09:10:14 Shibata, [name] ([id number]) -------------------------------------------------------------------------------- Nutrition Risk Screening Details Patient Name: [name] Date of Service: 11/22/2021 8:45 AM Medical Record Number: [id number] Patient Account Number: [id number] Date of Birth/Sex: March 03, 1956 (66 y.o. M) Treating RN: [name] Primary Care Amauris Debois: [name] Other Clinician: Referring Tyee Vandevoorde: [name] Treating Daphney Hopke/Extender: [name] in Treatment: 0 Height (in): 66 Weight (lbs): 279 Body Mass Index (BMI): 45 Nutrition Risk Screening Items Score Screening NUTRITION RISK SCREEN: I have an illness or condition that made me change the kind and/or amount of food I eat 0 No I eat fewer than two meals per day 0 No I eat few fruits and vegetables, or milk products 0 No I have three or more drinks of beer, liquor or wine almost every day 0 No I have tooth or mouth problems that make it hard for me to eat 0 No I don't always have enough money to buy the food I need 0 No I eat alone most of the time 0 No I take three or more different prescribed or over-the-counter drugs a day 1 Yes Without wanting to, I have lost or gained 10 pounds in the last six months 0 No I am not always physically able to shop, cook and/or feed myself 2 Yes Nutrition Protocols Good Risk Protocol Moderate Risk Protocol 0 Provide education on nutrition High Risk Proctocol Risk Level: Moderate Risk Score: 3 Electronic Signature(s) Signed: 11/22/2021 2:19:12 PM By: [name] Coria RN Entered By: [name] on 11/22/2021 09:05:42

## 2021-11-22 NOTE — Progress Notes (Signed)
[name] ([id number]) Visit Report for 11/22/2021 Allergy List Details Patient Name: [name] Date of Service: 11/22/2021 8:45 AM Medical Record Number: [id number] Patient Account Number: [id number] Date of Birth/Sex: 1955-09-29 (65 y.o. M) Treating RN: [name] Primary Care Laylynn Campanella: [name] Other Clinician: Referring Marka Treloar: [name] Treating Ariaunna Longsworth/Extender: Jeri Cos Weeks in Treatment: 0 Allergies Active Allergies No Known Allergies Allergy Notes Electronic Signature(s) Signed: 11/22/2021 2:19:12 PM By: [name] RN Entered By: [name] on 11/22/2021 09:03:09 Reffett, [name] ([id number]) -------------------------------------------------------------------------------- Arrival Information Details Patient Name: [name] Date of Service: 11/22/2021 8:45 AM Medical Record Number: [id number] Patient Account Number: [id number] Date of Birth/Sex: 1955-12-16 (65 y.o. M) Treating RN: [name] Primary Care Jaylin Roundy: [name] Other Clinician: Referring Darcus Edds: [name] Treating Deadrick Stidd/Extender: Skipper Cliche in Treatment: 0 Visit Information Patient Arrived: Wheel Chair Arrival Time: 09:01 Accompanied By: caregiver Transfer Assistance: None Patient Identification Verified: Yes Secondary Verification Process Completed: Yes Patient Requires Transmission-Based No Precautions: Patient Has Alerts: Yes Patient Alerts: Patient on Blood Thinner History Since Last Visit All ordered tests and consults were completed: No Added or deleted any medications: No Any new allergies or adverse reactions: No Had a fall or experienced change in activities of daily living that may affect risk of falls: No Signs or symptoms of abuse/neglect since last visito No Hospitalized since last visit: No Implantable device outside of the clinic excluding cellular tissue based products placed in the center  since last visit: No Has Dressing in Place as Prescribed: Yes Electronic Signature(s) Signed: 11/22/2021 2:19:12 PM By: [name] RN Entered By: [name] on 11/22/2021 09:02:21 [name], [name] ([id number]) -------------------------------------------------------------------------------- Clinic Level of Care Assessment Details Patient Name: [name] Date of Service: 11/22/2021 8:45 AM Medical Record Number: [id number] Patient Account Number: [id number] Date of Birth/Sex: Nov 18, 1955 (65 y.o. M) Treating RN: [name] Primary Care Lucy Woolever: [name] Other Clinician: Referring Aulani Shipton: [name] Treating Keeshia Sanderlin/Extender: Skipper Cliche in Treatment: 0 Clinic Level of Care Assessment Items TOOL 2 Quantity Score X - Use when only an EandM is performed on the INITIAL visit 1 0 ASSESSMENTS - Nursing Assessment / Reassessment X - General Physical Exam (combine w/ comprehensive assessment (listed just below) when performed on new 1 20 pt. evals) X- 1 25 Comprehensive Assessment (HX, ROS, Risk Assessments, Wounds Hx, etc.) ASSESSMENTS - Wound and Skin Assessment / Reassessment []  - Simple Wound Assessment / Reassessment - one wound 0 X- 4 5 Complex Wound Assessment / Reassessment - multiple wounds []  - 0 Dermatologic / Skin Assessment (not related to wound area) ASSESSMENTS - Ostomy and/or Continence Assessment and Care []  - Incontinence Assessment and Management 0 []  - 0 Ostomy Care Assessment and Management (repouching, etc.) PROCESS - Coordination of Care X - Simple Patient / Family Education for ongoing care 1 15 []  - 0 Complex (extensive) Patient / Family Education for ongoing care []  - 0 Staff obtains Programmer, systems, Records, Test Results / Process Orders []  - 0 Staff telephones HHA, Nursing Homes / Clarify orders / etc []  - 0 Routine Transfer to another Facility (non-emergent condition) []  - 0 Routine Hospital Admission (non-emergent  condition) X- 1 15 New Admissions / Biomedical engineer / Ordering NPWT, Apligraf, etc. []  - 0 Emergency Hospital Admission (emergent condition) X- 1 10 Simple Discharge Coordination []  - 0 Complex (extensive) Discharge Coordination PROCESS - Special Needs []  - Pediatric / Minor Patient Management 0 []  - 0 Isolation Patient Management []  - 0 Hearing / Language / Visual special  needs []  - 0 Assessment of Community assistance (transportation, D/C planning, etc.) []  - 0 Additional assistance / Altered mentation []  - 0 Support Surface(s) Assessment (bed, cushion, seat, etc.) INTERVENTIONS - Wound Cleansing / Measurement X - Wound Imaging (photographs - any number of wounds) 1 5 []  - 0 Wound Tracing (instead of photographs) []  - 0 Simple Wound Measurement - one wound X- 4 5 Complex Wound Measurement - multiple wounds [name], [name] ([id number]) []  - 0 Simple Wound Cleansing - one wound X- 4 5 Complex Wound Cleansing - multiple wounds INTERVENTIONS - Wound Dressings X - Small Wound Dressing one or multiple wounds 4 10 []  - 0 Medium Wound Dressing one or multiple wounds []  - 0 Large Wound Dressing one or multiple wounds []  - 0 Application of Medications - injection INTERVENTIONS - Miscellaneous []  - External ear exam 0 []  - 0 Specimen Collection (cultures, biopsies, blood, body fluids, etc.) []  - 0 Specimen(s) / Culture(s) sent or taken to Lab for analysis []  - 0 Patient Transfer (multiple staff / Civil Service fast streamer / Similar devices) []  - 0 Simple Staple / Suture removal (25 or less) []  - 0 Complex Staple / Suture removal (26 or more) []  - 0 Hypo / Hyperglycemic Management (close monitor of Blood Glucose) []  - 0 Ankle / Brachial Index (ABI) - do not check if billed separately Has the patient been seen at the hospital within the last three years: Yes Total Score: 190 Level Of Care: New/Established - Level 5 Electronic Signature(s) Signed: 11/22/2021 2:19:12  PM By: [name] RN Entered By: [name] on 11/22/2021 10:21:29 [name] ([id number]) -------------------------------------------------------------------------------- Encounter Discharge Information Details Patient Name: [name] Date of Service: 11/22/2021 8:45 AM Medical Record Number: [id number] Patient Account Number: [id number] Date of Birth/Sex: [birth year] (65 y.o. M) Treating RN: [name] Primary Care Hensley Treat: [name] Other Clinician: Referring Renna Kilmer: [name] Treating Dennard Vezina/Extender: Skipper Cliche in Treatment: 0 Encounter Discharge Information Items Discharge Condition: Stable Ambulatory Status: Wheelchair Discharge Destination: Home Transportation: Private Auto Accompanied By: wife Schedule Follow-up Appointment: Yes Clinical Summary of Care: Electronic Signature(s) Signed: 11/22/2021 10:22:22 AM By: [name] RN Entered By: [name] on 11/22/2021 10:22:22 [name] ([id number]) -------------------------------------------------------------------------------- Lower Extremity Assessment Details Patient Name: [name] Date of Service: 11/22/2021 8:45 AM Medical Record Number: [id number] Patient Account Number: [id number] Date of Birth/Sex: February 19, 1956 (65 y.o. M) Treating RN: [name] Primary Care Faiz Weber: [name] Other Clinician: Referring Conna Terada: [name] Treating Maddax Palinkas/Extender: Jeri Cos Weeks in Treatment: 0 Electronic Signature(s) Signed: 11/22/2021 2:19:12 PM By: [name] RN Entered By: [name] on 11/22/2021 09:30:16 Roadcap, [name] ([id number]) -------------------------------------------------------------------------------- Multi Wound Chart Details Patient Name: [name] Date of Service: 11/22/2021 8:45 AM Medical Record Number: [id number] Patient Account Number: [id number] Date of Birth/Sex: 03-08-1956 (65  y.o. 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 Comorbid History: Anemia, Sleep Apnea, Coronary Anemia, Sleep Apnea, Coronary Anemia, Sleep Apnea, Coronary Artery Disease, Deep Vein Artery Disease, Deep Vein Artery Disease, Deep Vein Thrombosis, Hypertension, Type II Thrombosis, Hypertension, Type II Thrombosis, Hypertension, Type II Diabetes, History of pressure Diabetes, History of pressure Diabetes, History of pressure wounds,  Neuropathy wounds, Neuropathy wounds, Neuropathy Date Acquired: 07/01/2021 07/01/2021 07/01/2021 Weeks of Treatment: 0 0 0 Wound Status: Open Open Open Wound Recurrence: No No No Measurements L x W x D (cm) 1.7x3.1x0.1 2.5x3.5x0.1 2x2x3 Area (cm) : 4.139 6.872 3.142 Volume (cm) : 0.414 0.687 9.425 % Reduction in Area: N/A N/A 0.00% % Reduction in Volume: N/A N/A 0.00% 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 Exudate Amount: Medium None Present Medium Exudate Type: Serosanguineous N/A Serosanguineous Exudate Color: red, brown N/A red, brown Granulation Amount: Large (67-100%) None Present (0%) Large (67-100%) Granulation Quality: Red, Pink N/A Red Necrotic Amount: Small (1-33%) Large (67-100%) Small (1-33%) Necrotic Tissue: Adherent [place] Exposed Structures: Fat Layer (Subcutaneous Tissue): Fascia: No Fat Layer (Subcutaneous Tissue): Yes Fat Layer (Subcutaneous Tissue): Yes Fascia: No No Fascia:  No Tendon: No Tendon: No Tendon: No Muscle: No Muscle: No Muscle: No Joint: No Joint: No Joint: No Bone: No Bone: No Bone: No Epithelialization: None None None Wound Number: 8 N/A N/A Photos: N/A N/A KALLON, EYER ([id number]) Wound Location: Left Gluteus N/A N/A Wounding Event: Gradually Appeared N/A N/A Primary Etiology: MASD N/A N/A Comorbid History: Anemia, Sleep Apnea, Coronary N/A N/A Artery Disease, Deep Vein Thrombosis, Hypertension, Type II Diabetes, History of pressure wounds, Neuropathy Date Acquired: 10/29/2021 N/A N/A Weeks of Treatment: 0 N/A N/A Wound Status: Open N/A N/A Wound Recurrence: No N/A N/A Measurements L x W x D (cm) 10x4.5x0.1 N/A N/A Area (cm) : 35.343 N/A N/A Volume (cm) : 3.534 N/A N/A % Reduction in Area: N/A N/A N/A % Reduction in Volume: N/A N/A N/A Classification: Full Thickness Without Exposed N/A N/A Support Structures Exudate Amount: Medium N/A N/A Exudate Type: Serosanguineous N/A N/A Exudate Color: red, brown N/A N/A Granulation Amount: Medium (34-66%) N/A N/A Granulation Quality: Red N/A N/A Necrotic Amount: Medium (34-66%) N/A N/A Necrotic Tissue: Adherent Slough N/A N/A Exposed Structures: Fat Layer (Subcutaneous Tissue): N/A N/A Yes Fascia: No Tendon: No Muscle: No Joint: No Bone: No Epithelialization: None N/A N/A Treatment Notes Electronic Signature(s) Signed: 11/22/2021 2:19:12 PM By: [name] RN Entered By: [name] on 11/22/2021 09:55:19 Iten, [name] ([id number]) -------------------------------------------------------------------------------- Multi-Disciplinary Care Plan Details Patient Name: [name] Date of Service: 11/22/2021 8:45 AM Medical Record Number: [id number] Patient Account Number: [id number] Date of Birth/Sex: [birth year] (65 y.o. M) Treating RN: [name] Primary Care Jacia Sickman: [name] Other Clinician: Referring Sintia Mckissic: [name] Treating Arnola Crittendon/Extender: Skipper Cliche in Treatment: 0 Active Inactive Wound/Skin Impairment Nursing Diagnoses: Knowledge deficit related to ulceration/compromised skin integrity Goals: Patient/caregiver will verbalize understanding of skin care regimen Date Initiated: 11/22/2021 Target Resolution Date: 12/23/2021 Goal Status: Active Ulcer/skin breakdown will have a volume reduction of 30% by week 4 Date Initiated: 11/22/2021 Target Resolution Date: 12/23/2021 Goal Status: Active Ulcer/skin breakdown will have a volume reduction of 50% by week 8 Date Initiated: 11/22/2021 Target Resolution Date: 01/22/2022 Goal Status: Active Ulcer/skin breakdown will have a volume reduction of 80% by week 12 Date Initiated: 11/22/2021 Target Resolution Date: 02/22/2022 Goal Status: Active Ulcer/skin breakdown will heal within 14 weeks Date Initiated: 11/22/2021 Target Resolution Date: 03/25/2022 Goal Status: Active Interventions: Assess patient/caregiver ability to obtain necessary supplies Assess patient/caregiver ability to perform ulcer/skin care regimen upon admission and as needed Assess ulceration(s) every visit Notes: Electronic Signature(s) Signed: 11/22/2021 2:19:12 PM By: [name] RN Entered By: [name] on 11/22/2021 09:55:02 Bigley, [name] ([id number]) -------------------------------------------------------------------------------- Pain Assessment Details Patient Name: [name] Date of Service: 11/22/2021 8:45 AM Medical Record Number:  [id number] Patient Account Number: [id number] Date of Birth/Sex: [birth year] (65 y.o. M) Treating RN: [name] Primary Care Melinna Linarez: [name] Other Clinician: Referring Catha Ontko: [name] Treating Adysen Raphael/Extender: Skipper Cliche in Treatment: 0 Active Problems Location of Pain Severity and Description of Pain Patient Has Paino No Site Locations Pain Management and Medication Current  Pain Management: Electronic Signature(s) Signed: 11/22/2021 2:19:12 PM By: [name] RN Entered By: [name] on 11/22/2021 09:02:35 Ke[place]ath, [name] ([id number]) -------------------------------------------------------------------------------- Patient/Caregiver Education Details Patient Name: [name] Date of Service: 11/22/2021 8:45 AM Medical Record Number: [id number] Patient Account Number: [id number] Date of Birth/Gender: Aug 04, 1955 (65 y.o. M) Treating RN: [name] Primary Care Physician: [name] Other Clinician: Referring Physician: [name] Treating Physician/Extender: Skipper Cliche in Treatment: 0 Education Assessment Education Provided To: Patient Education Topics Provided Wound/Skin Impairment: Methods: Explain/Verbal Responses: State content correctly Electronic Signature(s) Signed: 11/22/2021 2:19:12 PM By: [name] RN Entered By: [name] on 11/22/2021 10:21:48 Heikes, [name] ([id number]) -------------------------------------------------------------------------------- Wound Assessment Details Patient Name: [name] Date of Service: 11/22/2021 8:45 AM Medical Record Number: [id number] Patient Account Number: [id number] Date of Birth/Sex: 05-21-1956 (65 y.o. M) Treating RN: [name] Primary Care Marisela Line: [name] Other Clinician: Referring Elgin Carn: [name] Treating Rajon Bisig/Extender: Jeri Cos Weeks in Treatment: 0 Wound Status Wound Number: 5 Primary Pressure Ulcer Etiology: Wound Location: Right Calcaneus Wound Open Wounding Event: Pressure Injury Status: Date Acquired: 07/01/2021 Comorbid Anemia, Sleep Apnea, Coronary Artery Disease, Deep Vein Weeks Of Treatment: 0 History: Thrombosis, Hypertension, Type II Diabetes, History of Clustered Wound: No pressure wounds, Neuropathy Photos Wound Measurements Length: (cm) 1.7 % Reduc Width: (cm) 3.1 %  Reduc Depth: (cm) 0.1 Epithel Area: (cm) 4.139 Tunnel Volume: (cm) 0.414 Underm tion in Area: tion in Volume: ialization: None ing: No ining: No Wound Description Classification: Category/Stage III Foul Od Exudate Amount: Medium Slough/ Exudate Type: Serosanguineous Exudate Color: red, brown or After Cleansing: No Fibrino Yes Wound Bed Granulation Amount: Large (67-100%) Exposed Structure Granulation Quality: Red, Pink Fascia Exposed: No Necrotic Amount: Small (1-33%) Fat Layer (Subcutaneous Tissue) Exposed: Yes Necrotic Quality: Adherent Slough Tendon Exposed: No Muscle Exposed: No Joint Exposed: No Bone Exposed: No Treatment Notes Wound #5 (Calcaneus) Wound Laterality: Right Cleanser Peri-Wound Care Topical Primary Dressing Morino, [name] ([id number]) Xeroform-HBD 2x2 (in/in) Discharge Instruction: Apply Xeroform-HBD 2x2 (in/in) as directed Secondary Dressing (SILCONE BORDER) Zetuvit Plus SILICONE BORDER Dressing 5x5 (in/in) Discharge Instruction: Please do not put silicone bordered dressings under wraps. Use non-bordered dressing only. Secured With Compression Wrap Compression Stockings Add-Ons Electronic Signature(s) Signed: 11/22/2021 2:19:12 PM By: [name] RN Entered By: [name] on 11/22/2021 09:21:35 Convey, [name] ([id number]) -------------------------------------------------------------------------------- Wound Assessment Details Patient Name: [name] Date of Service: 11/22/2021 8:45 AM Medical Record Number: [id number] Patient Account Number: [id number] Date of Birth/Sex: [birth year] (65 y.o. M) Treating RN: [name] Primary Care Jamie-Lee Galdamez: [name] Other Clinician: Referring Joelynn Dust: [name] Treating Ethyl Vila/Extender: Jeri Cos Weeks in Treatment: 0 Wound Status Wound Number: 6 Primary Pressure Ulcer Etiology: Wound Location: Left Calcaneus Wound Open Wounding Event: Pressure  Injury Status: Date Acquired: 07/01/2021 Comorbid Anemia, Sleep Apnea, Coronary Artery Disease, Deep Vein Weeks Of Treatment: 0 History: Thrombosis, Hypertension, Type II Diabetes, History of Clustered Wound: No pressure wounds, Neuropathy Photos Wound Measurements Length: (cm) 2.5 Width: (cm) 3.5 Depth: (cm) 0.1 Area: (cm) 6.872 Volume: (cm) 0.687 % Reduction in Area: % Reduction in Volume: Epithelialization: None Tunneling: No Undermining: No Wound Description Classification: Unstageable/Unclassified Exudate Amount: None Present Foul Odor After  Cleansing: No Slough/Fibrino No Wound Bed Granulation Amount: None Present (0%) Exposed Structure Necrotic Amount: Large (67-100%) Fascia Exposed: No Necrotic Quality: Eschar Fat Layer (Subcutaneous Tissue) Exposed: No Tendon Exposed: No Muscle Exposed: No Joint Exposed: No Bone Exposed: No Treatment Notes Wound #6 (Calcaneus) Wound Laterality: Left Cleanser Peri-Wound Care Topical Betadine Discharge Instruction: Apply betadine as directed. paint daily [name], FANSLER ([id number]) Primary Dressing Secondary Dressing Secured With Compression Wrap Compression Stockings Add-Ons Electronic Signature(s) Signed: 11/22/2021 2:19:12 PM By: [name] RN Entered By: [name] on 11/22/2021 09:23:13 Marion, [name] ([id number]) -------------------------------------------------------------------------------- Wound Assessment Details Patient Name: [name] Date of Service: 11/22/2021 8:45 AM Medical Record Number: [id number] Patient Account Number: [id number] Date of Birth/Sex: 06-Dec-1955 (65 y.o. M) Treating RN: [name] Primary Care Joh Rao: [name] Other Clinician: Referring Maysa Lynn: [name] Treating Elbert Polyakov/Extender: Jeri Cos Weeks in Treatment: 0 Wound Status Wound Number: 7 Primary Pressure Ulcer Etiology: Wound Location: Sacrum Wound Open Wounding Event:  Pressure Injury Status: Date Acquired: 07/01/2021 Comorbid Anemia, Sleep Apnea, Coronary Artery Disease, Deep Vein Weeks Of Treatment: 0 History: Thrombosis, Hypertension, Type II Diabetes, History of Clustered Wound: No pressure wounds, Neuropathy Photos Wound Measurements Length: (cm) 2 % Reduct Width: (cm) 2 % Reduct Depth: (cm) 3 Epitheli Area: (cm) 3.142 Tunneli Volume: (cm) 9.425 Undermi ion in Area: 0% ion in Volume: 0% alization: None ng: No ning: No Wound Description Classification: Category/Stage IV Foul Odo Exudate Amount: Medium Slough/F Exudate Type: Serosanguineous Exudate Color: red, brown r After Cleansing: No ibrino Yes Wound Bed Granulation Amount: Large (67-100%) Exposed Structure Granulation Quality: Red Fascia Exposed: No Necrotic Amount: Small (1-33%) Fat Layer (Subcutaneous Tissue) Exposed: Yes Necrotic Quality: Adherent Slough Tendon Exposed: No Muscle Exposed: No Joint Exposed: No Bone Exposed: No Treatment Notes Wound #7 (Sacrum) Cleanser Peri-Wound Care Topical calmoseptine [name], [name] ([id number]) Discharge Instruction: apply to excorated areas Primary Dressing Secondary Dressing Secured With Compression Wrap Compression Stockings Add-Ons Electronic Signature(s) Signed: 11/22/2021 2:19:12 PM By: [name] RN Entered By: [name] on 11/22/2021 09:45:18 Shaul, [name] ([id number]) -------------------------------------------------------------------------------- Wound Assessment Details Patient Name: [name] Date of Service: 11/22/2021 8:45 AM Medical Record Number: [id number] Patient Account Number: [id number] Date of Birth/Sex: [birth year] (65 y.o. M) Treating RN: [name] Primary Care Camaron Cammack: [name] Other Clinician: Referring Dayon Witt: [name] Treating Mamie Diiorio/Extender: Jeri Cos Weeks in Treatment: 0 Wound Status Wound Number: 8 Primary MASD Etiology: Wound  Location: Left Gluteus Wound Open Wounding Event: Gradually Appeared Status: Date Acquired: 10/29/2021 Comorbid Anemia, Sleep Apnea, Coronary Artery Disease, Deep Vein Weeks Of Treatment: 0 History: Thrombosis, Hypertension, Type II Diabetes, History of Clustered Wound: No pressure wounds, Neuropathy Photos Wound Measurements Length: (cm) 10 Width: (cm) 4.5 Depth: (cm) 0.1 Area: (cm) 35.343 Volume: (cm) 3.534 % Reduction in Area: % Reduction in Volume: Epithelialization: None Tunneling: No Undermining: No Wound Description Classification: Full Thickness Without Exposed Support Structu Exudate Amount: Medium Exudate Type: Serosanguineous Exudate Color: red, brown res Foul Odor After Cleansing: No Slough/Fibrino Yes Wound Bed Granulation Amount: Medium (34-66%) Exposed Structure Granulation Quality: Red Fascia Exposed: No Necrotic Amount: Medium (34-66%) Fat Layer (Subcutaneous Tissue) Exposed: Yes Necrotic Quality: Adherent Slough Tendon Exposed: No Muscle Exposed: No Joint Exposed: No Bone Exposed: No Electronic Signature(s) Signed: 11/22/2021 2:19:12 PM By: [name] RN Entered By: [name] on 11/22/2021 09:30:04 Ezekiel, [name] ([id number]) -------------------------------------------------------------------------------- Vitals Details Patient Name: [name] Date of Service: 11/22/2021 8:45 AM Medical Record Number: [id number] Patient Account Number: [id number] Date of Birth/Sex: 03-09-1956 (65 y.o. M)  Treating RN: [name] Primary Care Maggy Wyble: [name] Other Clinician: Referring Lundyn Coste: [name] Treating Frimy Uffelman/Extender: Skipper Cliche in Treatment: 0 Vital Signs Time Taken: 09:02 Temperature (F): 98.3 Height (in): 66 Pulse (bpm): 99 Source: Stated Respiratory Rate (breaths/min): 18 Weight (lbs): 279 Blood Pressure (mmHg): 138/93 Source: Stated Reference Range: 80 - 120 mg / dl Body Mass Index  (BMI): 45 Electronic Signature(s) Signed: 11/22/2021 2:19:12 PM By: [name] RN Entered By: [name] on 11/22/2021 09:03:01

## 2021-11-23 NOTE — Progress Notes (Signed)
WAI, LITT ([id number]) Visit Report for 11/22/2021 Chief Complaint Document Details Patient Name: [name], [name] Date of Service: 11/22/2021 8:45 AM Medical Record Number: [id number] Patient Account Number: [id number] Date of Birth/Sex: 1956/03/11 (65 y.o. M) Treating RN: [name] Primary Care Provider: [name] Other Clinician: Referring Provider: [name] Treating Provider/Extender: Skipper Cliche in Treatment: 0 Information Obtained from: Patient Chief Complaint Sacral, right gluteal, and bilateral heel ulcers Electronic Signature(s) Signed: 11/22/2021 9:46:36 AM By: [name] PA-C Entered By: [name] on 11/22/2021 09:46:36 [name], [name] ([id number]) -------------------------------------------------------------------------------- HPI Details Patient Name: [name] Date of Service: 11/22/2021 8:45 AM Medical Record Number: [id number] Patient Account Number: [id number] Date of Birth/Sex: 07/22/1955 (66 y.o. M) Treating RN: [name] Primary Care Provider: [name] Other Clinician: Referring Provider: [name] Treating Provider/Extender: Skipper Cliche in Treatment: 0 History of Present Illness HPI Description: 05/29/2021 this is a patient who presents today for initial inspection here in the clinic concerning wounds that he has over the right heel and the sacral region. Unfortunately the sacral wound is starting to spread off to the right gluteal location due to how he sits always leaning towards the right side in his chair. His wife is present she is the primary caregiver though she is not home with him all the time she does have to work. She does do an excellent job however it appears to be in trying to keep things under good control for him. The patient is not able to change positions himself nor walk by himself so he is pretty much at the mercy of the position he is putting when she  is gone and this tends to be his chair which she sits and most of the day. Obviously this I think is the main culprit for what is going on currently. It was actually in January 2020 when the sacral wound started. It was in September 2022 when the wound started to spread more to the right gluteal location. Subsequently in August 2022 is when he had been in a skilled nursing facility and the heel started to give him trouble as well. That does not seem to be doing nearly as poorly as the sacral region. He was hospitalized in October 2022 secondary to sepsis and this was in regard to the foot and was sent to skilled nursing again he is now back at home. He did have a wound VAC for the sacral wound over the summer 2022 but being in and out of facilities this ended up getting sent back. The patient does have [facility] home health that comes out 1 time per week to help with care. His most recent hemoglobin A1c was 6.9 in August 2022. Patient's met past medical history includes generalized muscle weakness, bipolar disorder, diabetes mellitus type 2, hypertension, long-term use of anticoagulant therapy due to frequent blood clots/DVTs. He also has a history of traumatic brain injury. 07/24/2021 upon evaluation today patient appears to be doing decently well in regard to the pressure ulcer on the right heel as well as the sacral region. In general I think you are making some progress here which is great news. Overall the heel unfortunately had already closed previously when we saw him although it apparently reopened when he was working with physical therapy according to his wife. The area in the sacral region is doing well and looks clean there is still some depth here but I still think it would be difficult to wound VAC this region. His wife  does an awesome job taking care of him. Is been so long since we have seen him because he has been in the hospital to be honest. 08/07/2021 upon evaluation today patient  appears to be doing well at this time. Fortunately I do not see any signs of active infection locally or systemically at this time which is great news. No fevers, chills, nausea, vomiting, or diarrhea. Unfortunately after I saw him on the 24th he actually ended up in the hospital in the 27th due to being septic. This was not due to the wounds but after looking at records actually due to a UTI. Fortunately he is doing much better and very happy in that regard. I do not see any signs of infection locally nor systemically at this time. 08/21/2021 upon evaluation today patient appears to be doing well with regard to his wound. Fortunately I think that the sacral region is doing decently well at this point which is great news. With regard to the foot this also does have some slough noted but I feel like we are making progress here. He does have some irritation around the right upper thigh/gluteal region. I feel like it is more towards the thigh. Nonetheless this does appear to be pressure related he spends a lot of time sitting up his caregiver states he really will not get in the bed and stay there like he should. 09/04/2021 upon evaluation patient appears to be doing decently well today in regard to the wound on his heel as well as the sacral area. I am actually very pleased with both and how things are appearing currently. There does not appear to be any evidence of active infection I think that his caregiver is doing an awesome job with regard to the wound care here. She is present today as well and I did discuss this with her. 09/25/2021 upon evaluation today patient appears to be doing well with regard to his wound on the sacral region. His right heel is also doing well. Unfortunately he has a new area on his left heel which does appear to be pressure injury. I do not see any evidence of active infection locally or systemically which is great news no fevers, chills, nausea, vomiting, or  diarrhea. Readmission: 11-22-2021 upon evaluation today patient presents for reevaluation here in the clinic concerning issues with his wounds. Since have last seen him he was in the hospital and then subsequently ended up in a skilled nursing facility. Subsequently period of time in the facility he actually had a breakdown in the right thigh location which has been an issue for him. Fortunately I do not see any evidence of active infection locally or systemically which is great news and I am very pleased in that regard. Nonetheless he does have still the wounds on both heels as well as the wound in the sacral area and the new area in the right upper thigh/gluteal region. Electronic Signature(s) Signed: 11/22/2021 10:41:43 AM By: [name] PA-C Entered By: [name] on 11/22/2021 10:41:43 [name], [name] ([id number]) -------------------------------------------------------------------------------- Physical Exam Details Patient Name: [name] Date of Service: 11/22/2021 8:45 AM Medical Record Number: [id number] Patient Account Number: [id number] Date of Birth/Sex: [birth year] (65 y.o. M) Treating RN: [name] Primary Care Provider: [name] Other Clinician: Referring Provider: [name] Treating Provider/Extender: Skipper Cliche in Treatment: 0 Constitutional sitting or standing blood pressure is within target range for patient.. pulse regular and within target range for patient.Marland Kitchen respirations regular, non- labored  and within target range for patient.Marland Kitchen temperature within target range for patient.. Obese and well-hydrated in no acute distress. Eyes conjunctiva clear no eyelid edema noted. pupils equal round and reactive to light and accommodation. Ears, Nose, Mouth, and Throat no gross abnormality of ear auricles or external auditory canals. normal hearing noted during conversation. mucus membranes moist. Respiratory normal breathing without  difficulty. Cardiovascular 2+ dorsalis pedis/posterior tibialis pulses. no clubbing, cyanosis, significant edema, <3 sec cap refill. Musculoskeletal Patient unable to walk without assistance. Psychiatric this patient is able to make decisions and demonstrates good insight into disease process. Alert and Oriented x 3. pleasant and cooperative. Notes Upon inspection patient's wound bed actually showed signs of good granulation and epithelization at this point. Fortunately I do not see any evidence of active infection locally or systemically which is great news and overall I do believe that we are headed in the right direction here. Overall I do believe that we can probably go ahead and continue with the wound care measures as before with regard to the sacral area use in the Dakin's moistened gauze. I also think the Xeroform for his left heel is probably a good idea of the right heel I think Betadine is probably can to be the way to go. Electronic Signature(s) Signed: 11/22/2021 10:42:31 AM By: [name] PA-C Entered By: [name] on 11/22/2021 10:42:30 [name], [name] ([id number]) -------------------------------------------------------------------------------- Physician Orders Details Patient Name: [name] Date of Service: 11/22/2021 8:45 AM Medical Record Number: [id number] Patient Account Number: [id number] Date of Birth/Sex: 1956-06-21 (65 y.o. M) Treating RN: [name] Primary Care Provider: [name] Other Clinician: Referring Provider: [name] Treating Provider/Extender: Skipper Cliche in Treatment: 0 Verbal / Phone Orders: No Diagnosis Coding ICD-10 Coding Code Description [phone] Pressure ulcer of right heel, stage 3 L89.623 Pressure ulcer of left heel, stage 3 L89.154 Pressure ulcer of sacral region, stage 4 L24.A0 Irritant contact dermatitis due to friction or contact with body fluids, unspecified L98.412 Non-pressure  chronic ulcer of buttock with fat layer exposed M62.81 Muscle weakness (generalized) F31.9 Bipolar disorder, unspecified E11.622 Type 2 diabetes mellitus with other skin ulcer I10 Essential (primary) hypertension Z79.01 Long term (current) use of anticoagulants 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 Follow-up Appointments o Return Appointment in 3 weeks. [place] for wound care. May utilize formulary equivalent dressing for wound treatment orders unless otherwise specified. Home Health Nurse may visit PRN to address patientos wound care needs. Kathaleen Bury phone [phone] fax [phone] o Scheduled days for dressing changes to be completed; exception, patient has scheduled wound care visit that day. o **Please direct any NON-WOUND related issues/requests for orders to patient's Primary Care Physician. **If current dressing causes regression in wound condition, may D/C ordered dressing product/s and apply Normal Saline Moist Dressing daily until next [place] or Other MD appointment. **Notify [facility] of regression in wound condition at [phone]. Bathing/ Shower/ Hygiene o May shower with wound dressing protected with water repellent cover or cast protector. o No tub bath. Anesthetic (Use 'Patient Medications' Section for Anesthetic Order Entry) o Lidocaine applied to wound bed Edema Control - Lymphedema / Segmental Compressive Device / Other o Elevate, Exercise Daily and Avoid Standing for Long Periods of Time. o Elevate legs to the level of the heart and pump ankles as often as possible o Elevate leg(s) parallel to the floor when sitting. o DO YOUR BEST to sleep in the bed at night.  DO NOT sleep in your recliner. Long hours of sitting in a recliner leads to swelling of the legs and/or potential wounds on your backside. Off-Loading o Gel wheelchair cushion o Low air-loss mattress (Group  2) o Turn and reposition every 2 hours - keep pressure off of the sacrum and heels wounds o Other: - PRAFO boot in bed keep pressure off of sacrum/gluteus and heels- Additional Orders / Instructions o Follow Nutritious Diet and Increase Protein Intake Wound Treatment Wound #5 - Calcaneus Wound Laterality: Right Potteiger, Morio ([id number]) Primary Dressing: Xeroform-HBD 2x2 (in/in) 3 x Per Week/30 Days Discharge Instructions: Apply Xeroform-HBD 2x2 (in/in) as directed Secondary Dressing: (SILCONE BORDER) Zetuvit Plus SILICONE BORDER Dressing 5x5 (in/in) 3 x Per Week/30 Days Discharge Instructions: Please do not put silicone bordered dressings under wraps. Use non-bordered dressing only. Wound #6 - Calcaneus Wound Laterality: Left Topical: Betadine 1 x Per Day/30 Days Discharge Instructions: Apply betadine as directed. paint daily Wound #7 - Sacrum Topical: calmoseptine 1 x Per Day/30 Days Discharge Instructions: apply to excorated areas Wound #8 - Gluteus Wound Laterality: Right Primary Dressing: Gauze 1 x Per Day/30 Days Discharge Instructions: As directed: moistened with Dakins Solution Secondary Dressing: (SILICONE BORDER) Zetuvit Plus SILICONE BORDER Dressing 4x4 (in/in) 1 x Per Day/30 Days Discharge Instructions: Please do not put silicone bordered dressings under wraps. Use non-bordered dressing only. Electronic Signature(s) Signed: 11/23/2021 4:19:47 PM By: [name] RN Signed: 11/23/2021 4:54:58 PM By: [name] PA-C Previous Signature: 11/22/2021 2:19:12 PM Version By: [name] RN Entered By: [name] on 11/23/2021 08:33:25 [name], [name] ([id number]) -------------------------------------------------------------------------------- Problem List Details Patient Name: [name] Date of Service: 11/22/2021 8:45 AM Medical Record Number: [id number] Patient Account Number: [id number] Date of Birth/Sex: 02-Jan-1956 (65 y.o. 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 11/22/2021 No Yes M62.81 Muscle weakness (generalized) 11/22/2021 No Yes F31.9 Bipolar disorder, unspecified 11/22/2021 No Yes E11.622 Type 2 diabetes mellitus with other skin ulcer 11/22/2021 No Yes I10 Essential (primary) hypertension 11/22/2021 No Yes Z79.01 Long term (current) use of anticoagulants 11/22/2021 No Yes 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 11/22/2021 No Yes Inactive Problems Resolved Problems [name], [name] ([id number]) Electronic Signature(s) Signed: 11/22/2021 9:46:23 AM By: [name] PA-C Entered By: [name] on 11/22/2021 09:46:22 [name], [name] ([id number]) -------------------------------------------------------------------------------- Progress Note Details Patient Name: [name] Date of Service: 11/22/2021 8:45 AM Medical Record Number: [id number] Patient Account Number: [id number] Date of Birth/Sex: 01/11/1956 (65 y.o. M) Treating RN: [name] Primary Care Provider: [name] Other Clinician: Referring Provider: [name] Treating Provider/Extender: Skipper Cliche in Treatment: 0 Subjective Chief Complaint Information obtained from Patient Sacral, right gluteal, and bilateral heel ulcers History of Present Illness (HPI) 05/29/2021 this is a patient who presents today for  initial inspection here in the clinic concerning wounds that he has over the right heel and the sacral region. Unfortunately the sacral wound is starting to spread off to the right gluteal location due to how he sits always  leaning towards the right side in his chair. His wife is present she is the primary caregiver though she is not home with him all the time she does have to work. She does do an excellent job however it appears to be in trying to keep things under good control for him. The patient is not able to change positions himself nor walk by himself so he is pretty much at the mercy of the position he is putting when she is gone and this tends to be his chair which she sits and most of the day. Obviously this I think is the main culprit for what is going on currently. It was actually in January 2020 when the sacral wound started. It was in September 2022 when the wound started to spread more to the right gluteal location. Subsequently in August 2022 is when he had been in a skilled nursing facility and the heel started to give him trouble as well. That does not seem to be doing nearly as poorly as the sacral region. He was hospitalized in October 2022 secondary to sepsis and this was in regard to the foot and was sent to skilled nursing again he is now back at home. He did have a wound VAC for the sacral wound over the summer 2022 but being in and out of facilities this ended up getting sent back. The patient does have [facility] home health that comes out 1 time per week to help with care. His most recent hemoglobin A1c was 6.9 in August 2022. Patient's met past medical history includes generalized muscle weakness, bipolar disorder, diabetes mellitus type 2, hypertension, long-term use of anticoagulant therapy due to frequent blood clots/DVTs. He also has a history of traumatic brain injury. 07/24/2021 upon evaluation today patient appears to be doing decently well in regard to the pressure  ulcer on the right heel as well as the sacral region. In general I think you are making some progress here which is great news. Overall the heel unfortunately had already closed previously when we saw him although it apparently reopened when he was working with physical therapy according to his wife. The area in the sacral region is doing well and looks clean there is still some depth here but I still think it would be difficult to wound VAC this region. His wife does an awesome job taking care of him. Is been so long since we have seen him because he has been in the hospital to be honest. 08/07/2021 upon evaluation today patient appears to be doing well at this time. Fortunately I do not see any signs of active infection locally or systemically at this time which is great news. No fevers, chills, nausea, vomiting, or diarrhea. Unfortunately after I saw him on the 24th he actually ended up in the hospital in the 27th due to being septic. This was not due to the wounds but after looking at records actually due to a UTI. Fortunately he is doing much better and very happy in that regard. I do not see any signs of infection locally nor systemically at this time. 08/21/2021 upon evaluation today patient appears to be doing well with regard to his wound. Fortunately I think that the sacral region is doing decently well at this point which is great news. With regard to the foot this also does have some slough noted but I feel like we are making progress here. He does have some irritation around the right upper thigh/gluteal region. I feel  like it is more towards the thigh. Nonetheless this does appear to be pressure related he spends a lot of time sitting up his caregiver states he really will not get in the bed and stay there like he should. 09/04/2021 upon evaluation patient appears to be doing decently well today in regard to the wound on his heel as well as the sacral area. I am actually very pleased with  both and how things are appearing currently. There does not appear to be any evidence of active infection I think that his caregiver is doing an awesome job with regard to the wound care here. She is present today as well and I did discuss this with her. 09/25/2021 upon evaluation today patient appears to be doing well with regard to his wound on the sacral region. His right heel is also doing well. Unfortunately he has a new area on his left heel which does appear to be pressure injury. I do not see any evidence of active infection locally or systemically which is great news no fevers, chills, nausea, vomiting, or diarrhea. Readmission: 11-22-2021 upon evaluation today patient presents for reevaluation here in the clinic concerning issues with his wounds. Since have last seen him he was in the hospital and then subsequently ended up in a skilled nursing facility. Subsequently period of time in the facility he actually had a breakdown in the right thigh location which has been an issue for him. Fortunately I do not see any evidence of active infection locally or systemically which is great news and I am very pleased in that regard. Nonetheless he does have still the wounds on both heels as well as the wound in the sacral area and the new area in the right upper thigh/gluteal region. Patient History Information obtained from Patient. Allergies No Known Allergies Social History Never smoker, Marital Status - Married, Alcohol Use - Never, Drug Use - No History, Caffeine Use - Rarely. [name], [name] ([id number]) Medical History Hematologic/Lymphatic Patient has history of Anemia Respiratory Patient has history of Sleep Apnea Cardiovascular Patient has history of Coronary Artery Disease, Deep Vein Thrombosis, Hypertension Endocrine Patient has history of Type II Diabetes Integumentary (Skin) Patient has history of History of pressure wounds Neurologic Patient has history of  Neuropathy Denies history of Seizure Disorder Objective Constitutional sitting or standing blood pressure is within target range for patient.. pulse regular and within target range for patient.Marland Kitchen respirations regular, non- labored and within target range for patient.Marland Kitchen temperature within target range for patient.. Obese and well-hydrated in no acute distress. Vitals Time Taken: 9:02 AM, Height: 66 in, Source: Stated, Weight: 279 lbs, Source: Stated, BMI: 45, Temperature: 98.3 F, Pulse: 99 bpm, Respiratory Rate: 18 breaths/min, Blood Pressure: 138/93 mmHg. Eyes conjunctiva clear no eyelid edema noted. pupils equal round and reactive to light and accommodation. Ears, Nose, Mouth, and Throat no gross abnormality of ear auricles or external auditory canals. normal hearing noted during conversation. mucus membranes moist. Respiratory normal breathing without difficulty. Cardiovascular 2+ dorsalis pedis/posterior tibialis pulses. no clubbing, cyanosis, significant edema, Musculoskeletal Patient unable to walk without assistance. Psychiatric this patient is able to make decisions and demonstrates good insight into disease process. Alert and Oriented x 3. pleasant and cooperative. General Notes: Upon inspection patient's wound bed actually showed signs of good granulation and epithelization at this point. Fortunately I do not see any evidence of active infection locally or systemically which is great news and overall I do believe that we are headed in the right  direction here. Overall I do believe that we can probably go ahead and continue with the wound care measures as before with regard to the sacral area use in the Dakin's moistened gauze. I also think the Xeroform for his left heel is probably a good idea of the right heel I think Betadine is probably can to be the way to go. Integumentary (Hair, Skin) Wound #5 status is Open. Original cause of wound was Pressure Injury. The date acquired was:  07/01/2021. The wound is located on the Right Calcaneus. The wound measures 1.7cm length x 3.1cm width x 0.1cm depth; 4.139cm^2 area and 0.414cm^3 volume. There is Fat Layer (Subcutaneous Tissue) exposed. There is no tunneling or undermining noted. There is a medium amount of serosanguineous drainage noted. There is large (67-100%) red, pink granulation within the wound bed. There is a small (1-33%) amount of necrotic tissue within the wound bed including Adherent Slough. Wound #6 status is Open. Original cause of wound was Pressure Injury. The date acquired was: 07/01/2021. The wound is located on the Left Calcaneus. The wound measures 2.5cm length x 3.5cm width x 0.1cm depth; 6.872cm^2 area and 0.687cm^3 volume. There is no tunneling or undermining noted. There is a none present amount of drainage noted. There is no granulation within the wound bed. There is a large (67-100%) amount of necrotic tissue within the wound bed including Eschar. Wound #7 status is Open. Original cause of wound was Pressure Injury. The date acquired was: 07/01/2021. The wound is located on the Sacrum. The wound measures 2cm length x 2cm width x 3cm depth; 3.142cm^2 area and 9.425cm^3 volume. There is Fat Layer (Subcutaneous Tissue) exposed. There is no tunneling or undermining noted. There is a medium amount of serosanguineous drainage noted. There is large (67-100%) red granulation within the wound bed. There is a small (1-33%) amount of necrotic tissue within the wound bed including Adherent Slough. Wound #8 status is Open. Original cause of wound was Gradually Appeared. The date acquired was: 10/29/2021. The wound is located on the Right Guedes, Indalecio ([id number]) Gluteus. The wound measures 10cm length x 4.5cm width x 0.1cm depth; 35.343cm^2 area and 3.534cm^3 volume. There is Fat Layer (Subcutaneous Tissue) exposed. There is no tunneling or undermining noted. There is a medium amount of serosanguineous drainage  noted. There is medium (34-66%) red granulation within the wound bed. There is a medium (34-66%) amount of necrotic tissue within the wound bed including Adherent Slough. 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 Bathing/ Shower/ Hygiene: May shower with wound dressing protected with water repellent cover or cast protector. No tub bath. Anesthetic (Use 'Patient Medications' Section for Anesthetic Order Entry): Lidocaine applied to wound bed Edema Control - Lymphedema / Segmental Compressive Device / Other: Elevate, Exercise Daily and Avoid Standing for Long Periods of Time. Elevate legs to the level of the heart and pump ankles as often as possible Elevate leg(s) parallel to the floor when sitting. DO YOUR BEST to sleep in the bed at night. DO NOT sleep in your recliner. Long hours of  sitting in a recliner leads to swelling of the legs and/or potential wounds on your backside. Off-Loading: Gel wheelchair cushion Low air-loss mattress (Group 2) Turn and reposition every 2 hours - keep pressure off of the sacrum and heels wounds Other: - PRAFO boot in bed keep pressure off of sacrum/gluteus and heels- Additional Orders / Instructions: Follow Nutritious Diet and Increase Protein Intake WOUND #5: - Calcaneus Wound Laterality: Right Primary Dressing: Xeroform-HBD 2x2 (in/in) 3 x Per Week/30 Days Discharge Instructions: Apply Xeroform-HBD 2x2 (in/in) as  directed Secondary Dressing: (SILCONE BORDER) Zetuvit Plus SILICONE BORDER Dressing 5x5 (in/in) 3 x Per Week/30 Days Discharge Instructions: Please do not put silicone bordered dressings under wraps. Use non-bordered dressing only. WOUND #6: - Calcaneus Wound Laterality: Left Topical: Betadine 1 x Per Day/30 Days Discharge Instructions: Apply betadine as directed. paint daily WOUND #7: - Sacrum Wound Laterality: Topical: calmoseptine 1 x Per Day/30 Days Discharge Instructions: apply to excorated areas WOUND #8: - Gluteus Wound Laterality: Right Primary Dressing: Gauze 1 x Per Day/30 Days Discharge Instructions: As directed: moistened with Dakins Solution Secondary Dressing: (SILICONE BORDER) Zetuvit Plus SILICONE BORDER Dressing 4x4 (in/in) 1 x Per Day/30 Days Discharge Instructions: Please do not put silicone bordered dressings under wraps. Use non-bordered dressing only. 1. We will go ahead and continue with the Dakin's moistened gauze dressing to the sacral region which I believe is doing quite well. 2. We will get a use, Ceftin for the right thigh region to try to see if we can protect this area and get it to heal. 3. I am also can recommend that we have the patient continue to utilize Xeroform gauze for the right heel which I do believe is doing well. For the left heel were again to be using Betadine daily in order to help here. JASIYAH, PAULDING ([id number]) 4. I am going to suggest as well that we have the patient continue to offload appropriately I do believe his caregiver does a great job with this. Obviously it was when he was in the facility the things that happened as they should and that caused some complication for him. We will see patient back for reevaluation in 3 weeks here in the clinic. If anything worsens or changes patient will contact our office for additional recommendations. Patient does have home health. Electronic Signature(s) Signed: 11/22/2021 10:43:28 AM By:  [name] PA-C Entered By: [name] on 11/22/2021 10:43:28 Rieser, [name] ([id number]) -------------------------------------------------------------------------------- ROS/PFSH Details Patient Name: [name] Date of Service: 11/22/2021 8:45 AM Medical Record Number: [id number] Patient Account Number: [id number] Date of Birth/Sex: 1956-01-11 (65 y.o. M) Treating RN: [name] Primary Care Provider: [name] Other Clinician: Referring Provider: [name] Treating Provider/Extender: Skipper Cliche in Treatment: 0 Information Obtained From Patient Hematologic/Lymphatic Medical History: Positive for: Anemia Respiratory Medical History: Positive for: Sleep Apnea Cardiovascular Medical History: Positive for: Coronary Artery Disease; Deep Vein Thrombosis; Hypertension Endocrine Medical History: Positive for: Type II Diabetes Time with diabetes: 5 years Treated with: Oral agents Blood sugar tested every day: No Integumentary (Skin) Medical History: Positive for: History of pressure wounds Neurologic Medical History: Positive for: Neuropathy Negative for: Seizure Disorder Immunizations Pneumococcal Vaccine: Received Pneumococcal Vaccination: No Implantable Devices None Family and Social History Never smoker; Marital Status - Married; Alcohol Use: Never; Drug Use: No History; Caffeine Use: Rarely; Financial Concerns: No; Food, Clothing or Shelter Needs: No; Support System Lacking: No; Transportation Concerns: No Engineer, maintenance) Signed: 11/22/2021 2:19:12 PM By: [name] RN Signed:  11/23/2021 4:54:58 PM By: [name] PA-C Entered By: [name] on 11/22/2021 09:03:27 Asche, [name] ([id number]) -------------------------------------------------------------------------------- SuperBill Details Patient Name: [name] Date of Service: 11/22/2021 Medical Record Number: [id number] Patient  Account Number: [id number] Date of Birth/Sex: Feb 20, 1956 (65 y.o. M) Treating RN: [name] Primary Care Provider: [name] Other Clinician: Referring Provider: [name] Treating Provider/Extender: Skipper Cliche in Treatment: 0 Diagnosis Coding ICD-10 Codes Code Description [phone] Pressure ulcer of right heel, stage 3 L89.623 Pressure ulcer of left heel, stage 3 L89.154 Pressure ulcer of sacral region, stage 4 L24.A0 Irritant contact dermatitis due to friction or contact with body fluids, unspecified L98.412 Non-pressure chronic ulcer of buttock with fat layer exposed M62.81 Muscle weakness (generalized) F31.9 Bipolar disorder, unspecified E11.622 Type 2 diabetes mellitus with other skin ulcer I10 Essential (primary) hypertension Z79.01 Long term (current) use of anticoagulants 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 Facility Procedures CPT4 Code: 35789784 Description: [phone] - WOUND CARE VISIT-LEV 5 EST PT Modifier: Quantity: 1 Physician Procedures CPT4 Code: 8208138 Description: 87195 - WC PHYS LEVEL 4 - EST PT Modifier: Quantity: 1 CPT4 Code: Description: ICD-10 Diagnosis Description L89.613 Pressure ulcer of right heel, stage 3 L89.623 Pressure ulcer of left heel, stage 3 L89.154 Pressure ulcer of sacral region, stage 4 L24.A0 Irritant contact dermatitis due to friction or contact with body  fluids, Modifier: unspecified Quantity: Electronic Signature(s) Signed: 11/22/2021 10:43:49 AM By: [name] PA-C Previous Signature: 11/22/2021 10:21:36 AM Version By: [name] RN Entered By: [name] on 11/22/2021 10:43:48

## 2021-11-24 ENCOUNTER — Other Ambulatory Visit: Payer: Self-pay | Admitting: Psychiatry

## 2021-11-24 DIAGNOSIS — G4701 Insomnia due to medical condition: Secondary | ICD-10-CM

## 2021-11-24 DIAGNOSIS — F3178 Bipolar disorder, in full remission, most recent episode mixed: Secondary | ICD-10-CM

## 2021-11-24 DIAGNOSIS — F411 Generalized anxiety disorder: Secondary | ICD-10-CM

## 2021-12-13 ENCOUNTER — Encounter: Payer: Medicare Other | Attending: Physician Assistant | Admitting: Physician Assistant

## 2021-12-13 DIAGNOSIS — Z7901 Long term (current) use of anticoagulants: Secondary | ICD-10-CM | POA: Diagnosis not present

## 2021-12-13 DIAGNOSIS — L98412 Non-pressure chronic ulcer of buttock with fat layer exposed: Secondary | ICD-10-CM | POA: Diagnosis not present

## 2021-12-13 DIAGNOSIS — L89613 Pressure ulcer of right heel, stage 3: Secondary | ICD-10-CM | POA: Diagnosis not present

## 2021-12-13 DIAGNOSIS — M6281 Muscle weakness (generalized): Secondary | ICD-10-CM | POA: Insufficient documentation

## 2021-12-13 DIAGNOSIS — I10 Essential (primary) hypertension: Secondary | ICD-10-CM | POA: Diagnosis not present

## 2021-12-13 DIAGNOSIS — E11622 Type 2 diabetes mellitus with other skin ulcer: Secondary | ICD-10-CM | POA: Insufficient documentation

## 2021-12-13 DIAGNOSIS — L89623 Pressure ulcer of left heel, stage 3: Secondary | ICD-10-CM | POA: Diagnosis not present

## 2021-12-13 DIAGNOSIS — L24A0 Irritant contact dermatitis due to friction or contact with body fluids, unspecified: Secondary | ICD-10-CM | POA: Diagnosis not present

## 2021-12-13 DIAGNOSIS — Z87820 Personal history of traumatic brain injury: Secondary | ICD-10-CM | POA: Insufficient documentation

## 2021-12-13 DIAGNOSIS — L89154 Pressure ulcer of sacral region, stage 4: Secondary | ICD-10-CM | POA: Insufficient documentation

## 2021-12-13 NOTE — Progress Notes (Signed)
DELAN, KSIAZEK ([id number]) Visit Report for 12/13/2021 Chief Complaint Document Details Patient Name: LIONARDO, HAZE Date of Service: 12/13/2021 8:00 AM Medical Record Number: [id number] Patient Account Number: [id number] Date of Birth/Sex: 10-07-1955 (66 y.o. M) Treating RN: [name] Primary Care Provider: Dione Booze Other Clinician: Referring Provider: Dione Booze Treating Provider/Extender: Rowan Blase in Treatment: 3 Information Obtained from: Patient Chief Complaint Sacral, right gluteal, and bilateral heel ulcers Electronic Signature(s) Signed: 12/13/2021 8:29:28 AM By: Lenda Kelp PA-C Entered By: Lenda Kelp on 12/13/2021 09:47:09 [name] ([id number]) -------------------------------------------------------------------------------- Debridement Details Patient Name: [name] Date of Service: 12/13/2021 8:00 AM Medical Record Number: [id number] Patient Account Number: [id number] Date of Birth/Sex: 01/31/1956 (66 y.o. M) Treating RN: [name] Primary Care Provider: Dione Booze Other Clinician: Referring Provider: Dione Booze Treating Provider/Extender: Rowan Blase in Treatment: 3 Debridement Performed for Wound #6 Left Calcaneus Assessment: Performed By: Physician Nelida Meuse., PA-C Debridement Type: Debridement Level of Consciousness (Pre- Awake and Alert procedure): Pre-procedure Verification/Time Out Yes - 08:30 Taken: Start Time: 08:30 Total Area Debrided (L x W): 2 (cm) x 3.5 (cm) = 7 (cm) Tissue and other material Viable, Non-Viable, Eschar, Slough, Subcutaneous, Slough debrided: Level: Skin/Subcutaneous Tissue Debridement Description: Excisional Instrument: Forceps, Scissors Bleeding: Moderate Hemostasis Achieved: Pressure End Time: 08:38 Procedural Pain: 0 Post Procedural Pain: 0 Response to Treatment: Procedure was tolerated well Level of Consciousness  (Post- Awake and Alert procedure): Post Debridement Measurements of Total Wound Length: (cm) 2 Stage: Unstageable/Unclassified Width: (cm) 3.5 Depth: (cm) 0.2 Volume: (cm) 1.1 Character of Wound/Ulcer Post Debridement: Improved Post Procedure Diagnosis Same as Pre-procedure Electronic Signature(s) Unsigned Entered ByYevonne Pax on 12/13/2021 08:35:42 Signature(s): Date(s): [name] ([id number]) -------------------------------------------------------------------------------- Physician Orders Details Patient Name: [name] Date of Service: 12/13/2021 8:00 AM Medical Record Number: [id number] Patient Account Number: [id number] Date of Birth/Sex: Nov 10, 1955 (66 y.o. M) Treating RN: [name] Primary Care Provider: Dione Booze Other Clinician: Referring Provider: Dione Booze Treating Provider/Extender: Rowan Blase in Treatment: 3 Verbal / Phone Orders: No Diagnosis Coding ICD-10 Coding Code Description [phone] Pressure ulcer of right heel, stage 3 L89.623 Pressure ulcer of left heel, stage 3 L89.154 Pressure ulcer of sacral region, stage 4 L24.A0 Irritant contact dermatitis due to friction or contact with body fluids, unspecified L98.412 Non-pressure chronic ulcer of buttock with fat layer exposed M62.81 Muscle weakness (generalized) F31.9 Bipolar disorder, unspecified E11.622 Type 2 diabetes mellitus with other skin ulcer I10 Essential (primary) hypertension Z79.01 Long term (current) use of anticoagulants 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 Follow-up Appointments o Return Appointment in 3 weeks. Home Health o [facility] for wound care. May utilize formulary equivalent dressing for wound treatment orders unless otherwise specified. Home Health Nurse may visit PRN to address patientos wound care needs. [facility] phone [phone] fax [phone] o Scheduled days for dressing changes to be  completed; exception, patient has scheduled wound care visit that day. o **Please direct any NON-WOUND related issues/requests for orders to patient's Primary Care Physician. **If current dressing causes regression in wound condition, may D/C ordered dressing product/s and apply Normal Saline Moist Dressing daily until next Wound Healing Center or Other MD appointment. **Notify Wound Healing Center of regression in wound condition at [phone]. Bathing/ Shower/ Hygiene o May shower with wound dressing protected with water repellent cover or cast protector. o No tub bath. Anesthetic (Use 'Patient Medications' Section for Anesthetic Order Entry) o Lidocaine applied to wound bed Edema Control - Lymphedema /  Segmental Compressive Device / Other o Elevate, Exercise Daily and Avoid Standing for Long Periods of Time. o Elevate legs to the level of the heart and pump ankles as often as possible o Elevate leg(s) parallel to the floor when sitting. o DO YOUR BEST to sleep in the bed at night. DO NOT sleep in your recliner. Long hours of sitting in a recliner leads to swelling of the legs and/or potential wounds on your backside. Off-Loading o Gel wheelchair cushion o Low air-loss mattress (Group 2) o Turn and reposition every 2 hours - keep pressure off of the sacrum and heels wounds o Other: - PRAFO boot in bed keep pressure off of sacrum/gluteus and heels- Additional Orders / Instructions o Follow Nutritious Diet and Increase Protein Intake Wound Treatment Wound #5 - Calcaneus Wound Laterality: Right Judson, Trevonn ([id number]) Primary Dressing: Xeroform-HBD 2x2 (in/in) 3 x Per Week/30 Days Discharge Instructions: Apply Xeroform-HBD 2x2 (in/in) as directed Secondary Dressing: (SILCONE BORDER) Zetuvit Plus SILICONE BORDER Dressing 5x5 (in/in) 3 x Per Week/30 Days Discharge Instructions: Please do not put silicone bordered dressings under wraps. Use  non-bordered dressing only. Wound #6 - Calcaneus Wound Laterality: Left Primary Dressing: Xeroform 4x4-HBD (in/in) 1 x Per Day/30 Days Discharge Instructions: Apply Xeroform 4x4-HBD (in/in) as directed Secondary Dressing: (SILICONE BORDER) Zetuvit Plus SILICONE BORDER Dressing 4x4 (in/in) 1 x Per Day/30 Days Discharge Instructions: Please do not put silicone bordered dressings under wraps. Use non-bordered dressing only. Wound #7 - Sacrum Topical: calmoseptine 1 x Per Day/30 Days Discharge Instructions: apply to excorated areas Wound #8 - Gluteus Wound Laterality: Right Primary Dressing: Gauze 1 x Per Day/30 Days Discharge Instructions: As directed: moistened with Dakins Solution Secondary Dressing: (SILICONE BORDER) Zetuvit Plus SILICONE BORDER Dressing 4x4 (in/in) 1 x Per Day/30 Days Discharge Instructions: Please do not put silicone bordered dressings under wraps. Use non-bordered dressing only. Electronic Signature(s) Unsigned Entered By: [name] on 12/13/2021 08:36:54 Signature(s): Date(s): [name] ([id number]) -------------------------------------------------------------------------------- Problem List Details Patient Name: JESHURUN, OAXACA Date of Service: 12/13/2021 8:00 AM Medical Record Number: [id number] Patient Account Number: [id number] Date of Birth/Sex: [birth year] (66 y.o. M) Treating RN: [name] Primary Care Provider: Dione Booze Other Clinician: Referring Provider: Dione Booze Treating Provider/Extender: Rowan Blase in Treatment: 3 Active Problems ICD-10 Encounter Code Description Active Date MDM Diagnosis L89.613 Pressure ulcer of right heel, stage 3 11/22/2021 No Yes L89.623 Pressure ulcer of left heel, stage 3 11/22/2021 No Yes L89.154 Pressure ulcer of sacral region, stage 4 11/22/2021 No Yes L24.A0 Irritant contact dermatitis due to friction or contact with body fluids, 11/22/2021 No Yes unspecified L98.412  Non-pressure chronic ulcer of buttock with fat layer exposed 11/22/2021 No Yes M62.81 Muscle weakness (generalized) 11/22/2021 No Yes F31.9 Bipolar disorder, unspecified 11/22/2021 No Yes E11.622 Type 2 diabetes mellitus with other skin ulcer 11/22/2021 No Yes I10 Essential (primary) hypertension 11/22/2021 No Yes Z79.01 Long term (current) use of anticoagulants 11/22/2021 No Yes 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 11/22/2021 No Yes Inactive Problems Resolved Problems JEREMI, LOSITO ([id number]) Electronic Signature(s) Signed: 12/13/2021 8:28:33 AM By: Lenda Kelp PA-C Entered By: Lenda Kelp on 12/13/2021 08:28:33

## 2021-12-14 ENCOUNTER — Ambulatory Visit (INDEPENDENT_AMBULATORY_CARE_PROVIDER_SITE_OTHER): Payer: Medicare Other | Admitting: Physician Assistant

## 2021-12-14 DIAGNOSIS — R339 Retention of urine, unspecified: Secondary | ICD-10-CM

## 2021-12-14 DIAGNOSIS — Z466 Encounter for fitting and adjustment of urinary device: Secondary | ICD-10-CM

## 2021-12-14 NOTE — Progress Notes (Signed)
Cath Change/ Replacement  Patient is present today for a catheter change due to urinary retention.  64ml of water was removed from the balloon, a 16FR foley cath was removed without difficulty.  Patient was cleaned and prepped in a sterile fashion with betadine and 2% lidocaine jelly was instilled into the urethra. A 16 FR Silastic foley cath was replaced into the bladder no complications were noted Urine return was noted 36ml and urine was cloudy and clear in color. The balloon was filled with 27ml of sterile water. A night bag was attached for drainage.  Patient tolerated well.    Performed by: [name], PA-C and Gerarda Gunther, CMA  Additional notes: Patient was having his catheter changed with home health, but the service has changed and the new company states they do not have orders to perform Foley changes/no longer provide this service. OK to resume monthly in-office Foley exchanges.  Partner reported concern for malodorous urine; they deny lower abdominal pain or fever. Reassured them that this is a benign finding and no abx indicated.  Partner attempted to irrigate Foley with saline via the balloon port yesterday. Demonstrated proper irrigation technique today and counseled her to refrain from manipulating the balloon port. Balloon deflated easily today.  Follow up: Return in about 4 weeks (around 01/11/2022) for Catheter exchange.

## 2021-12-15 DIAGNOSIS — Y92009 Unspecified place in unspecified non-institutional (private) residence as the place of occurrence of the external cause: Secondary | ICD-10-CM | POA: Insufficient documentation

## 2021-12-15 DIAGNOSIS — Z7901 Long term (current) use of anticoagulants: Secondary | ICD-10-CM | POA: Insufficient documentation

## 2021-12-15 DIAGNOSIS — W19XXXA Unspecified fall, initial encounter: Secondary | ICD-10-CM | POA: Insufficient documentation

## 2021-12-15 DIAGNOSIS — L89154 Pressure ulcer of sacral region, stage 4: Secondary | ICD-10-CM | POA: Diagnosis not present

## 2021-12-15 DIAGNOSIS — Z79899 Other long term (current) drug therapy: Secondary | ICD-10-CM | POA: Insufficient documentation

## 2021-12-15 DIAGNOSIS — R509 Fever, unspecified: Principal | ICD-10-CM | POA: Insufficient documentation

## 2021-12-15 DIAGNOSIS — N390 Urinary tract infection, site not specified: Secondary | ICD-10-CM | POA: Diagnosis not present

## 2021-12-15 DIAGNOSIS — Z20822 Contact with and (suspected) exposure to covid-19: Secondary | ICD-10-CM | POA: Insufficient documentation

## 2021-12-15 DIAGNOSIS — I129 Hypertensive chronic kidney disease with stage 1 through stage 4 chronic kidney disease, or unspecified chronic kidney disease: Secondary | ICD-10-CM | POA: Diagnosis not present

## 2021-12-15 DIAGNOSIS — Z86718 Personal history of other venous thrombosis and embolism: Secondary | ICD-10-CM | POA: Insufficient documentation

## 2021-12-15 DIAGNOSIS — E1122 Type 2 diabetes mellitus with diabetic chronic kidney disease: Secondary | ICD-10-CM | POA: Diagnosis not present

## 2021-12-15 DIAGNOSIS — R531 Weakness: Secondary | ICD-10-CM | POA: Diagnosis not present

## 2021-12-15 DIAGNOSIS — Z7984 Long term (current) use of oral hypoglycemic drugs: Secondary | ICD-10-CM | POA: Insufficient documentation

## 2021-12-15 DIAGNOSIS — E785 Hyperlipidemia, unspecified: Secondary | ICD-10-CM | POA: Diagnosis not present

## 2021-12-15 DIAGNOSIS — N1832 Chronic kidney disease, stage 3b: Secondary | ICD-10-CM | POA: Insufficient documentation

## 2021-12-15 NOTE — ED Triage Notes (Addendum)
FIRST NURSE NOTE:  pt arrrived via [facility] from home, s/p fall, pt has temp 100.5, pt has open sacral wound and goes to wound care last seen 2 days ago.  P-99  99% RA  119/69 RR 20 Pt has foley 20G L AC   CBG 223

## 2021-12-16 ENCOUNTER — Emergency Department: Payer: Medicare Other

## 2021-12-16 ENCOUNTER — Other Ambulatory Visit: Payer: Self-pay

## 2021-12-16 ENCOUNTER — Observation Stay
Admission: EM | Admit: 2021-12-16 | Discharge: 2021-12-17 | Disposition: A | Discharge status: Home / Self Care | Payer: Medicare Other | Attending: Internal Medicine | Admitting: Internal Medicine

## 2021-12-16 ENCOUNTER — Encounter: Payer: Self-pay | Admitting: Emergency Medicine

## 2021-12-16 ENCOUNTER — Observation Stay: Payer: Medicare Other

## 2021-12-16 DIAGNOSIS — R531 Weakness: Secondary | ICD-10-CM

## 2021-12-16 DIAGNOSIS — N1832 Chronic kidney disease, stage 3b: Secondary | ICD-10-CM | POA: Diagnosis present

## 2021-12-16 DIAGNOSIS — F3160 Bipolar disorder, current episode mixed, unspecified: Secondary | ICD-10-CM | POA: Diagnosis present

## 2021-12-16 DIAGNOSIS — N390 Urinary tract infection, site not specified: Secondary | ICD-10-CM | POA: Diagnosis not present

## 2021-12-16 DIAGNOSIS — L89154 Pressure ulcer of sacral region, stage 4: Secondary | ICD-10-CM

## 2021-12-16 DIAGNOSIS — R509 Fever, unspecified: Principal | ICD-10-CM | POA: Diagnosis present

## 2021-12-16 DIAGNOSIS — W19XXXA Unspecified fall, initial encounter: Secondary | ICD-10-CM

## 2021-12-16 DIAGNOSIS — E785 Hyperlipidemia, unspecified: Secondary | ICD-10-CM | POA: Diagnosis present

## 2021-12-16 DIAGNOSIS — E1129 Type 2 diabetes mellitus with other diabetic kidney complication: Secondary | ICD-10-CM | POA: Diagnosis present

## 2021-12-16 DIAGNOSIS — I10 Essential (primary) hypertension: Secondary | ICD-10-CM | POA: Diagnosis present

## 2021-12-16 DIAGNOSIS — F319 Bipolar disorder, unspecified: Secondary | ICD-10-CM | POA: Diagnosis present

## 2021-12-16 LAB — COMPREHENSIVE METABOLIC PANEL
ALT: 17 U/L (ref 0–44)
ALT: 13 U/L (ref 0–44)
AST: 22 U/L (ref 15–41)
AST: 15 U/L (ref 15–41)
Albumin: 2.6 g/dL — ABNORMAL LOW (ref 3.5–5.0)
Albumin: 2.5 g/dL — ABNORMAL LOW (ref 3.5–5.0)
Alkaline Phosphatase: 95 U/L (ref 38–126)
Alkaline Phosphatase: 91 U/L (ref 38–126)
Anion gap: 7 (ref 5–15)
Anion gap: 4 — ABNORMAL LOW (ref 5–15)
BUN: 35 mg/dL — ABNORMAL HIGH (ref 8–23)
BUN: 35 mg/dL — ABNORMAL HIGH (ref 8–23)
CO2: 17 mmol/L — ABNORMAL LOW (ref 22–32)
CO2: 18 mmol/L — ABNORMAL LOW (ref 22–32)
Calcium: 8.2 mg/dL — ABNORMAL LOW (ref 8.9–10.3)
Calcium: 8.2 mg/dL — ABNORMAL LOW (ref 8.9–10.3)
Chloride: 109 mmol/L (ref 98–111)
Chloride: 111 mmol/L (ref 98–111)
Creatinine, Ser: 1.74 mg/dL — ABNORMAL HIGH (ref 0.61–1.24)
Creatinine, Ser: 1.84 mg/dL — ABNORMAL HIGH (ref 0.61–1.24)
GFR, Estimated: 43 mL/min — ABNORMAL LOW (ref >60)
GFR, Estimated: 40 mL/min — ABNORMAL LOW (ref >60)
Glucose, Bld: 202 mg/dL — ABNORMAL HIGH (ref 70–99)
Glucose, Bld: 154 mg/dL — ABNORMAL HIGH (ref 70–99)
Potassium: 5.9 mmol/L — ABNORMAL HIGH (ref 3.5–5.1)
Potassium: 4.6 mmol/L (ref 3.5–5.1)
Sodium: 133 mmol/L — ABNORMAL LOW (ref 135–145)
Sodium: 133 mmol/L — ABNORMAL LOW (ref 135–145)
Total Bilirubin: 1 mg/dL (ref 0.3–1.2)
Total Bilirubin: 0.6 mg/dL (ref 0.3–1.2)
Total Protein: 8.1 g/dL (ref 6.5–8.1)
Total Protein: 7.6 g/dL (ref 6.5–8.1)

## 2021-12-16 LAB — URINALYSIS, ROUTINE W REFLEX MICROSCOPIC
Bilirubin Urine: NEGATIVE
Glucose, UA: NEGATIVE mg/dL
Ketones, ur: NEGATIVE mg/dL
Nitrite: NEGATIVE
Protein, ur: 100 mg/dL — AB
Specific Gravity, Urine: 1.015 (ref 1.005–1.030)
WBC, UA: >50 WBC/hpf — ABNORMAL HIGH (ref 0–5)
pH: 8 (ref 5.0–8.0)

## 2021-12-16 LAB — CBC WITH DIFFERENTIAL/PLATELET
Abs Immature Granulocytes: 0.03 10*3/uL (ref 0.00–0.07)
Abs Immature Granulocytes: 0.05 10*3/uL (ref 0.00–0.07)
Basophils Absolute: 0 10*3/uL (ref 0.0–0.1)
Basophils Absolute: 0 10*3/uL (ref 0.0–0.1)
Basophils Relative: 0 %
Basophils Relative: 1 %
Eosinophils Absolute: 0.1 10*3/uL (ref 0.0–0.5)
Eosinophils Absolute: 0.1 10*3/uL (ref 0.0–0.5)
Eosinophils Relative: 1 %
Eosinophils Relative: 2 %
HCT: 27.1 % — ABNORMAL LOW (ref 39.0–52.0)
HCT: 27.2 % — ABNORMAL LOW (ref 39.0–52.0)
Hemoglobin: 8.2 g/dL — ABNORMAL LOW (ref 13.0–17.0)
Hemoglobin: 8.3 g/dL — ABNORMAL LOW (ref 13.0–17.0)
Immature Granulocytes: 0 %
Immature Granulocytes: 1 %
Lymphocytes Relative: 16 %
Lymphocytes Relative: 17 %
Lymphs Abs: 1.2 10*3/uL (ref 0.7–4.0)
Lymphs Abs: 1.3 10*3/uL (ref 0.7–4.0)
MCH: 26.6 pg (ref 26.0–34.0)
MCH: 26.7 pg (ref 26.0–34.0)
MCHC: 30.3 g/dL (ref 30.0–36.0)
MCHC: 30.5 g/dL (ref 30.0–36.0)
MCV: 87.5 fL (ref 80.0–100.0)
MCV: 88 fL (ref 80.0–100.0)
Monocytes Absolute: 0.8 10*3/uL (ref 0.1–1.0)
Monocytes Absolute: 0.9 10*3/uL (ref 0.1–1.0)
Monocytes Relative: 11 %
Monocytes Relative: 12 %
Neutro Abs: 4.6 10*3/uL (ref 1.7–7.7)
Neutro Abs: 5.8 10*3/uL (ref 1.7–7.7)
Neutrophils Relative %: 68 %
Neutrophils Relative %: 71 %
Platelets: 176 10*3/uL (ref 150–400)
Platelets: 192 10*3/uL (ref 150–400)
RBC: 3.08 MIL/uL — ABNORMAL LOW (ref 4.22–5.81)
RBC: 3.11 MIL/uL — ABNORMAL LOW (ref 4.22–5.81)
RDW: 17.2 % — ABNORMAL HIGH (ref 11.5–15.5)
RDW: 17.4 % — ABNORMAL HIGH (ref 11.5–15.5)
WBC: 6.8 10*3/uL (ref 4.0–10.5)
WBC: 8.2 10*3/uL (ref 4.0–10.5)
nRBC: 0 % (ref 0.0–0.2)
nRBC: 0 % (ref 0.0–0.2)

## 2021-12-16 LAB — SARS CORONAVIRUS 2 BY RT PCR: SARS Coronavirus 2 by RT PCR: NEGATIVE

## 2021-12-16 LAB — POTASSIUM: Potassium: 4.3 mmol/L (ref 3.5–5.1)

## 2021-12-16 LAB — CBG MONITORING, ED
Glucose-Capillary: 119 mg/dL — ABNORMAL HIGH (ref 70–99)
Glucose-Capillary: 146 mg/dL — ABNORMAL HIGH (ref 70–99)
Glucose-Capillary: 129 mg/dL — ABNORMAL HIGH (ref 70–99)

## 2021-12-16 LAB — LACTIC ACID, PLASMA: Lactic Acid, Venous: 1.1 mmol/L (ref 0.5–1.9)

## 2021-12-16 LAB — PROTIME-INR
INR: 1.7 — ABNORMAL HIGH (ref 0.8–1.2)
Prothrombin Time: 19.4 seconds — ABNORMAL HIGH (ref 11.4–15.2)

## 2021-12-16 LAB — GLUCOSE, CAPILLARY: Glucose-Capillary: 161 mg/dL — ABNORMAL HIGH (ref 70–99)

## 2021-12-16 MED ORDER — OMEGA-3-ACID ETHYL ESTERS 1 G PO CAPS
1.0000 | ORAL_CAPSULE | Freq: Every day | ORAL | Status: DC
  Administered 2021-12-16 – 2021-12-17 (×2): 1 g via ORAL
  Filled 2021-12-16 (×2): qty 1

## 2021-12-16 MED ORDER — INSULIN ASPART 100 UNIT/ML IJ SOLN
0.0000 [IU] | Freq: Three times a day (TID) | INTRAMUSCULAR | Status: DC
  Administered 2021-12-16 (×2): 1 [IU] via SUBCUTANEOUS
  Administered 2021-12-17: 2 [IU] via SUBCUTANEOUS
  Filled 2021-12-16 (×3): qty 1

## 2021-12-16 MED ORDER — QUETIAPINE FUMARATE 25 MG PO TABS: 25.0000 mg | ORAL_TABLET | Freq: Every day | ORAL | Status: DC | PRN

## 2021-12-16 MED ORDER — ACETAMINOPHEN 325 MG RE SUPP: 650.0000 mg | Freq: Once | RECTAL | Status: DC

## 2021-12-16 MED ORDER — ZINC SULFATE 220 (50 ZN) MG PO CAPS
220.0000 mg | ORAL_CAPSULE | Freq: Every day | ORAL | Status: DC
  Administered 2021-12-16 – 2021-12-17 (×2): 220 mg via ORAL
  Filled 2021-12-16 (×2): qty 1

## 2021-12-16 MED ORDER — APIXABAN 5 MG PO TABS
5.0000 mg | ORAL_TABLET | Freq: Two times a day (BID) | ORAL | Status: DC
  Administered 2021-12-16 – 2021-12-17 (×3): 5 mg via ORAL
  Filled 2021-12-16 (×3): qty 1

## 2021-12-16 MED ORDER — ACETAMINOPHEN 650 MG RE SUPP: 650.0000 mg | Freq: Four times a day (QID) | RECTAL | Status: DC | PRN

## 2021-12-16 MED ORDER — ROSUVASTATIN CALCIUM 10 MG PO TABS
20.0000 mg | ORAL_TABLET | Freq: Every day | ORAL | Status: DC
  Administered 2021-12-16: 20 mg via ORAL
  Filled 2021-12-16: qty 2
  Filled 2021-12-16: qty 1

## 2021-12-16 MED ORDER — VITAMIN B-12 1000 MCG PO TABS
3000.0000 ug | ORAL_TABLET | Freq: Every day | ORAL | Status: DC
  Administered 2021-12-16 – 2021-12-17 (×2): 3000 ug via ORAL
  Filled 2021-12-16 (×2): qty 3

## 2021-12-16 MED ORDER — SODIUM CHLORIDE 0.9 % IV SOLN: INTRAVENOUS | Status: AC

## 2021-12-16 MED ORDER — ASCORBIC ACID 500 MG PO TABS
500.0000 mg | ORAL_TABLET | Freq: Every day | ORAL | Status: DC
  Administered 2021-12-16 – 2021-12-17 (×2): 500 mg via ORAL
  Filled 2021-12-16 (×2): qty 1

## 2021-12-16 MED ORDER — ACETAMINOPHEN 500 MG PO TABS
1000.0000 mg | ORAL_TABLET | Freq: Once | ORAL | Status: AC
  Administered 2021-12-16: 1000 mg via ORAL
  Filled 2021-12-16: qty 2

## 2021-12-16 MED ORDER — VITAMIN D 25 MCG (1000 UNIT) PO TABS
1000.0000 [IU] | ORAL_TABLET | Freq: Every day | ORAL | Status: DC
Start: 2021-12-16 — End: 2021-12-17
  Administered 2021-12-16 – 2021-12-17 (×2): 1000 [IU] via ORAL
  Filled 2021-12-16 (×2): qty 1

## 2021-12-16 MED ORDER — SODIUM CHLORIDE 0.9 % IV SOLN
2.0000 g | Freq: Two times a day (BID) | INTRAVENOUS | Status: DC
  Administered 2021-12-16 – 2021-12-17 (×2): 2 g via INTRAVENOUS
  Filled 2021-12-16 (×3): qty 12.5

## 2021-12-16 MED ORDER — SODIUM CHLORIDE 0.9 % IV SOLN
2.0000 g | INTRAVENOUS | Status: AC
  Administered 2021-12-16: 2 g via INTRAVENOUS
  Filled 2021-12-16: qty 12.5

## 2021-12-16 MED ORDER — SODIUM CHLORIDE 0.9 % IV SOLN: INTRAVENOUS | Status: DC

## 2021-12-16 MED ORDER — QUETIAPINE FUMARATE 25 MG PO TABS
100.0000 mg | ORAL_TABLET | Freq: Every day | ORAL | Status: DC
  Administered 2021-12-16: 100 mg via ORAL
  Filled 2021-12-16: qty 4

## 2021-12-16 MED ORDER — MEMANTINE HCL 5 MG PO TABS
10.0000 mg | ORAL_TABLET | Freq: Two times a day (BID) | ORAL | Status: DC
  Administered 2021-12-16 – 2021-12-17 (×3): 10 mg via ORAL
  Filled 2021-12-16 (×3): qty 2

## 2021-12-16 MED ORDER — OXCARBAZEPINE 300 MG PO TABS
300.0000 mg | ORAL_TABLET | Freq: Two times a day (BID) | ORAL | Status: DC
  Administered 2021-12-16 – 2021-12-17 (×3): 300 mg via ORAL
  Filled 2021-12-16 (×3): qty 1

## 2021-12-16 MED ORDER — AMLODIPINE BESYLATE 5 MG PO TABS: 5.0000 mg | ORAL_TABLET | Freq: Every day | ORAL | Status: DC

## 2021-12-16 MED ORDER — SODIUM CHLORIDE 0.9 % IV BOLUS (SEPSIS)
1000.0000 mL | Freq: Once | INTRAVENOUS | Status: AC
  Administered 2021-12-16: 1000 mL via INTRAVENOUS

## 2021-12-16 MED ORDER — ACETAMINOPHEN 325 MG PO TABS: 650.0000 mg | ORAL_TABLET | Freq: Four times a day (QID) | ORAL | Status: DC | PRN

## 2021-12-16 NOTE — Progress Notes (Signed)
Pharmacy Antibiotic Note  [name] is a 66 y.o. male admitted on 12/16/2021 with UTI.  Pharmacy has been consulted for Cefepime dosing.  Pt w/ recent catheter change on 6/16 and open sacral wound last seen by wound care on 6/15.  Plan: Cefepime 2 gm q12h per indication & renal fxn.  Pharmacy will continue to follow and adjust abx dosing whenever warranted.  Height: 5\' 6"  (167.6 cm) Weight: 113.4 kg (250 lb) IBW/kg (Calculated) : 63.8  Temp (24hrs), Avg:100.4 F (38 C), Min:100.4 F (38 C), Max:100.4 F (38 C)  Recent Labs  Lab 12/16/21 0020  WBC 8.2  CREATININE 1.74*  LATICACIDVEN 1.1    Estimated Creatinine Clearance: 49.4 mL/min (A) (by C-G formula based on SCr of 1.74 mg/dL (H)).    No Known Allergies  Antimicrobials this admission: 6/18 Cefepime >>   Microbiology results: 6/18 BCx: Pending 6/18 UCx: Pending   Thank you for allowing pharmacy to be a part of this patient's care.  7/18, PharmD, [facility] 12/16/2021 5:47 AM

## 2021-12-16 NOTE — Progress Notes (Signed)
No charge progress note.  [name] is a 66 y.o. 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  Patient usually uses a wheelchair to ambulate.  Patient did not lose consciousness but as per the wife patient was not doing well yesterday and also was found to have fever.  Patient's Foley catheter was just changed 2 days ago.   Patient was recently admitted for MSSA bacteremia, which was adequately treated and repeat cultures were negative.     ED Course: In the ER patient was initially hypotensive tachycardic and febrile.  Patient's UA is concerning for possible UTI was started on cefepime.  Cultures obtained admitted for further observation.  Was started on fluids.  CT head and C-spine unremarkable chest x-ray unremarkable.  On exam patient's sacral decubitus ulcer does not have any discharge.  The left heel wound does have mild discharge.  He was started on cefepime for concern of UTI.  6/18: Preliminary blood cultures negative.  Urine cultures pending, UA with pyuria but patient has chronic indwelling Foley catheter.  Patient is feeling little tired when seen during morning rounds.  No urinary symptoms but he does not have any sensations and history of neurogenic bladder.  Remained afebrile and blood pressure improved.  Physical exam was pretty much benign except chronic Foley catheter and a chronic left heel wound with very scant serous discharge.  We will continue with current antibiotics and follow-up cultures.  We will de-escalate antibiotics according to the culture results.

## 2021-12-16 NOTE — H&P (Signed)
History and Physical    [name] DOB: 1956-03-11 DOA: 12/16/2021  PCP: [name], MD  Patient coming from: Home.  Chief Complaint: Follow-up.  HPI: [name] is a 66 y.o. 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  Patient usually uses a wheelchair to ambulate.  Patient did not lose consciousness but as per the wife patient was not doing well yesterday and also was found to have fever.  Patient's Foley catheter was just changed 2 days ago.  Patient was recently admitted for MSSA bacteremia.   ED Course: In the ER patient was initially hypotensive tachycardic and febrile.  Patient's UA is concerning for possible UTI was started on cefepime.  Cultures obtained admitted for further observation.  Was started on fluids.  CT head and C-spine unremarkable chest x-ray unremarkable.  On exam patient's sacral decubitus ulcer does not have any discharge.  The left heel wound does have mild discharge.  Review of Systems: As per HPI, rest all negative.   Past Medical History:  Diagnosis Date   Bipolar disorder ([place])    Diabetes mellitus without complication ([place])    History of blood clots    Hypertension    TBI (traumatic brain injury) ([place])     Past Surgical History:  Procedure Laterality Date   BACK SURGERY     CARPAL TUNNEL RELEASE Bilateral    COLON SURGERY     COLOSTOMY     IR PERC TUN PERIT CATH WO PORT S&I /IMAG  10/11/2021   IR REMOVAL TUN CV CATH W/O FL  11/19/2021   IR US GUIDE VASC ACCESS RIGHT  10/11/2021   TONSILLECTOMY       reports that he has never smoked. He has never been exposed to tobacco smoke. He has never used smokeless tobacco. He reports current alcohol use. He reports that he  does not use drugs.  No Known Allergies  Family History  Problem Relation Age of Onset   Depression Father    Deep vein thrombosis Father     Prior to Admission medications   Medication Sig Start Date End Date Taking? Authorizing Provider  amLODipine (NORVASC) 5 MG tablet Take 5 mg by mouth daily. 08/15/21  Yes [provider]  amLODipine-olmesartan (AZOR) 5-20 MG tablet Take 1 tablet by mouth daily. 11/14/21  Yes [provider]  ascorbic acid (VITAMIN C) 500 MG tablet Take 1 tablet (500 mg total) by mouth daily. 07/31/21  Yes Fritzi Mandes, MD  cholecalciferol (VITAMIN D) 25 MCG tablet Take 1 tablet (1,000 Units total) by mouth daily. 07/31/21  Yes Fritzi Mandes, MD  ELIQUIS 5 MG TABS tablet Take 5 mg by mouth 2 (two) times daily.  11/17/18  Yes [provider]  fish oil-omega-3 fatty acids 1000 MG capsule SMARTSIG:1 Capsule(s) By Mouth Daily 10/12/21  Yes [provider]  glipiZIDE (GLUCOTROL XL) 2.5 MG 24 hr tablet Take 1 tablet (2.5 mg total) by mouth daily with breakfast. 06/25/21  Yes Wieting, Richard, MD  memantine (NAMENDA) 10 MG tablet Take 10 mg by mouth 2 (two) times daily.   Yes [provider]  Oxcarbazepine (TRILEPTAL) 300 MG tablet Take 1 tablet (300 mg total) by mouth 2 (two) times daily. 08/30/21  Yes Ursula Alert, MD  QUEtiapine (SEROQUEL) 100 MG tablet Take 1 tablet (100 mg total) by mouth at bedtime. 08/30/21  Yes Eappen, Levin Bacon, MD  QUEtiapine (SEROQUEL) 50 MG tablet TAKE 1/2 TO 1 (ONE-HALF TO ONE) TABLET BY MOUTH ONCE DAILY AS NEEDED FOR  AGITATION 11/26/21  Yes Eappen, Levin Bacon, MD  rosuvastatin (CRESTOR) 20 MG tablet Take 20 mg by mouth daily.  08/29/17  Yes [provider]  vitamin B-12 (CYANOCOBALAMIN) 1000 MCG tablet Take 3,000 mcg by mouth daily.   Yes [provider]  zinc sulfate 220 (50 Zn) MG capsule Take 1 capsule (220 mg total) by mouth daily. 07/31/21  Yes Enedina Finner, MD    Physical Exam: Constitutional:  Moderately built and nourished. Vitals:   12/16/21 0200 12/16/21 0230 12/16/21 0300 12/16/21 0330  BP: 107/63 122/77 (!) 123/98 118/69  Pulse: 81 81 79 76  Resp: 18 20 20 18   Temp:      TempSrc:      SpO2: 98% 100% 99% 100%  Weight:      Height:       Eyes: Anicteric no pallor. ENMT: No discharge from the ears eyes nose and mouth. Neck: No mass felt.  No neck rigidity. Respiratory: No rhonchi or crepitations. Cardiovascular: S1-S2 heard. Abdomen: Soft nontender bowel sound present. Musculoskeletal: Stage IV sacral wound with no active discharge or also some stage I ulcer on the left buttock area.  There is bilateral heel wounds which showing some discharge the left heel. Skin: Sacral wound stage IV decubitus with no active discharge the left heel wound discharge.  Right heel wound appears healed. Neurologic: Alert awake oriented time place and person.  Moving all extremities. Psychiatric: Appears normal.  Normal affect.   Labs on Admission: I have personally reviewed following labs and imaging studies  CBC: Recent Labs  Lab 12/16/21 0020  WBC 8.2  NEUTROABS 5.8  HGB 8.2*  HCT 27.1*  MCV 88.0  PLT 192   Basic Metabolic Panel: Recent Labs  Lab 12/16/21 0020 12/16/21 0238  NA 133*  --   K 5.9* 4.3  CL 109  --   CO2 17*  --   GLUCOSE 202*  --   BUN 35*  --   CREATININE 1.74*  --   CALCIUM 8.2*  --    GFR: Estimated Creatinine Clearance: 49.4 mL/min (A) (by C-G formula based on SCr of 1.74 mg/dL (H)). Liver Function Tests: Recent Labs  Lab 12/16/21 0020  AST 22  ALT 17  ALKPHOS 95  BILITOT 1.0  PROT 8.1  ALBUMIN 2.6*   No results for input(s): "LIPASE", "AMYLASE" in the last 168 hours. No results for input(s): "AMMONIA" in the last 168 hours. Coagulation Profile: Recent Labs  Lab 12/16/21 0020  INR 1.7*   Cardiac Enzymes: No results for input(s): "CKTOTAL", "CKMB", "CKMBINDEX", "TROPONINI" in the last 168 hours. BNP (last 3 results) No results for  input(s): "PROBNP" in the last 8760 hours. HbA1C: No results for input(s): "HGBA1C" in the last 72 hours. CBG: No results for input(s): "GLUCAP" in the last 168 hours. Lipid Profile: No results for input(s): "CHOL", "HDL", "LDLCALC", "TRIG", "CHOLHDL", "LDLDIRECT" in the last 72 hours. Thyroid Function Tests: No results for input(s): "TSH", "T4TOTAL", "FREET4", "T3FREE", "THYROIDAB" in the last 72 hours. Anemia Panel: No results for input(s): "VITAMINB12", "FOLATE", "FERRITIN", "TIBC", "IRON", "RETICCTPCT" in the last 72 hours. Urine analysis:    Component Value Date/Time   COLORURINE YELLOW (A) 12/16/2021 0238   APPEARANCEUR CLOUDY (A) 12/16/2021 12/18/2021  APPEARANCEUR Cloudy (A) 08/09/2021 1559   LABSPEC 1.015 12/16/2021 0238   PHURINE 8.0 12/16/2021 0238   GLUCOSEU NEGATIVE 12/16/2021 0238   HGBUR SMALL (A) 12/16/2021 0238   BILIRUBINUR NEGATIVE 12/16/2021 0238   BILIRUBINUR Negative 08/09/2021 [place] 12/16/2021 0238   PROTEINUR 100 (A) 12/16/2021 0238   NITRITE NEGATIVE 12/16/2021 0238   LEUKOCYTESUR LARGE (A) 12/16/2021 0238   Sepsis Labs: @LABRCNTIP (procalcitonin:4,lacticidven:4) ) Recent Results (from the past 240 hour(s))  SARS Coronavirus 2 by RT PCR (hospital order, performed in [facility] hospital lab) *cepheid single result test* Urine, Catheterized     Status: None   Collection Time: 12/16/21  2:38 AM   Specimen: Urine, Catheterized; Nasal Swab  Result Value Ref Range Status   SARS Coronavirus 2 by RT PCR NEGATIVE NEGATIVE Final    Comment: (NOTE) SARS-CoV-2 target nucleic acids are NOT DETECTED.  The SARS-CoV-2 RNA is generally detectable in upper and lower respiratory specimens during the acute phase of infection. The lowest concentration of SARS-CoV-2 viral copies this assay can detect is 250 copies / mL. A negative result does not preclude SARS-CoV-2 infection and should not be used as the sole basis for treatment or other patient  management decisions.  A negative result may occur with improper specimen collection / handling, submission of specimen other than nasopharyngeal swab, presence of viral mutation(s) within the areas targeted by this assay, and inadequate number of viral copies (<250 copies / mL). A negative result must be combined with clinical observations, patient history, and epidemiological information.  Fact Sheet for Patients:   https://www.patel.info/  Fact Sheet for Healthcare Providers: https://hall.com/  This test is not yet approved or  cleared by the [place] FDA and has been authorized for detection and/or diagnosis of SARS-CoV-2 by FDA under an Emergency Use Authorization (EUA).  This EUA will remain in effect (meaning this test can be used) for the duration of the COVID-19 declaration under Section 564(b)(1) of the Act, 21 U.S.C. section 360bbb-3(b)(1), unless the authorization is terminated or revoked sooner.  Performed at [facility], [address], [place], [place] [address]      Radiological Exams on Admission: CT HEAD WO CONTRAST (5MM)  Result Date: 12/16/2021 CLINICAL DATA:  66 year old male with history of trauma from a fall. Fever. EXAM: CT HEAD WITHOUT CONTRAST CT CERVICAL SPINE WITHOUT CONTRAST TECHNIQUE: Multidetector CT imaging of the head and cervical spine was performed following the standard protocol without intravenous contrast. Multiplanar CT image reconstructions of the cervical spine were also generated. RADIATION DOSE REDUCTION: This exam was performed according to the departmental dose-optimization program which includes automated exposure control, adjustment of the mA and/or kV according to patient size and/or use of iterative reconstruction technique. COMPARISON:  No priors. FINDINGS: CT HEAD FINDINGS Brain: Patchy areas of decreased attenuation are noted throughout the deep and periventricular white matter  of the cerebral hemispheres bilaterally, compatible with mild chronic microvascular ischemic disease. No evidence of acute infarction, hemorrhage, hydrocephalus, extra-axial collection or mass lesion/mass effect. Vascular: No hyperdense vessel or unexpected calcification. Skull: Normal. Negative for fracture or focal lesion. Sinuses/Orbits: No acute finding. Other: None. CT CERVICAL SPINE FINDINGS Alignment: Normal. Skull base and vertebrae: No acute fracture. No primary bone lesion or focal pathologic process. Soft tissues and spinal canal: No prevertebral fluid or swelling. No visible canal hematoma. Disc levels: Multilevel degenerative disc disease most severe at C3-C4, C4-C5, C5-C6 and C6-C7, with extensive osteophytosis throughout these levels. Mild multilevel facet arthropathy. Upper  chest: Visualized portions are unremarkable. Other: None. IMPRESSION: 1. No evidence of significant acute traumatic injury to the skull, brain or cervical spine. 2. Mild chronic microvascular ischemic changes in the cerebral white matter, as above. 3. Multilevel degenerative disc disease and cervical spondylosis, as above. Electronically Signed   By: Vinnie Langton M.D.   On: 12/16/2021 05:11   CT Cervical Spine Wo Contrast  Result Date: 12/16/2021 CLINICAL DATA:  66 year old male with history of trauma from a fall. Fever. EXAM: CT HEAD WITHOUT CONTRAST CT CERVICAL SPINE WITHOUT CONTRAST TECHNIQUE: Multidetector CT imaging of the head and cervical spine was performed following the standard protocol without intravenous contrast. Multiplanar CT image reconstructions of the cervical spine were also generated. RADIATION DOSE REDUCTION: This exam was performed according to the departmental dose-optimization program which includes automated exposure control, adjustment of the mA and/or kV according to patient size and/or use of iterative reconstruction technique. COMPARISON:  No priors. FINDINGS: CT HEAD FINDINGS Brain: Patchy  areas of decreased attenuation are noted throughout the deep and periventricular white matter of the cerebral hemispheres bilaterally, compatible with mild chronic microvascular ischemic disease. No evidence of acute infarction, hemorrhage, hydrocephalus, extra-axial collection or mass lesion/mass effect. Vascular: No hyperdense vessel or unexpected calcification. Skull: Normal. Negative for fracture or focal lesion. Sinuses/Orbits: No acute finding. Other: None. CT CERVICAL SPINE FINDINGS Alignment: Normal. Skull base and vertebrae: No acute fracture. No primary bone lesion or focal pathologic process. Soft tissues and spinal canal: No prevertebral fluid or swelling. No visible canal hematoma. Disc levels: Multilevel degenerative disc disease most severe at C3-C4, C4-C5, C5-C6 and C6-C7, with extensive osteophytosis throughout these levels. Mild multilevel facet arthropathy. Upper chest: Visualized portions are unremarkable. Other: None. IMPRESSION: 1. No evidence of significant acute traumatic injury to the skull, brain or cervical spine. 2. Mild chronic microvascular ischemic changes in the cerebral white matter, as above. 3. Multilevel degenerative disc disease and cervical spondylosis, as above. Electronically Signed   By: Vinnie Langton M.D.   On: 12/16/2021 05:11   DG Chest 1 View  Result Date: 12/16/2021 CLINICAL DATA:  Sepsis, fever EXAM: CHEST  1 VIEW COMPARISON:  10/06/2021 FINDINGS: Lungs volumes are small, but are symmetric and are clear. No pneumothorax or pleural effusion. Cardiac size within normal limits. Pulmonary vascularity is normal. Osseous structures are age-appropriate. No acute bone abnormality. IMPRESSION: Pulmonary hypoinflation. Electronically Signed   By: Fidela Salisbury M.D.   On: 12/16/2021 01:45     Assessment/Plan Principal Problem:   Fever Active Problems:   Bipolar disorder ([place])   Hyperlipidemia   Hypertension   Type II diabetes mellitus with renal manifestations  (HCC)   Bipolar I disorder, most recent episode mixed (HCC)   Stage 3b chronic kidney disease (CKD) (HCC) - baseline SCr 1.8-1.9   Acute UTI    SIRS/fever was hypotensive tachycardic febrile at presentation likely source could be UTI with chronic indwelling Foley catheter.  We will request to change the catheter.  Continue with antibiotics for now follow cultures continue IV fluids. High tension we will hold antihypertensives since patient was hypotensive at presentation.  Follow blood pressure trends. Diabetes mellitus type 2 with renal manifestation we will keep patient on sliding scale coverage. History of DVT on Eliquis. Chronic anemia likely related to renal disease follow CBC hemoglobin appears to be at baseline. Chronic kidney disease stage IV creatinine appears to be at baseline. Bipolar disorder on Seroquel and also takes Trileptal. Recent admission for MSSA bacteremia.  X-ray of  the left heel and the x-ray pelvis are pending.    DVT prophylaxis: Eliquis. Code Status: Full code. Family Communication: We will need to discuss with family. Disposition Plan: Home when stable. Consults called: Wound team. Admission status: Observation.   Eduard Clos MD [facility] Pager [phone].  If 7PM-7AM, please contact night-coverage [email or web address] Password [facility]  12/16/2021, 5:18 AM

## 2021-12-16 NOTE — Progress Notes (Signed)
PHARMACY -  BRIEF ANTIBIOTIC NOTE   Pharmacy has received consult(s) for Cefepime from an ED provider.  The patient's profile has been reviewed for ht/wt/allergies/indication/available labs.    One time order(s) placed for Cefepime 2 gm  Further antibiotics/pharmacy consults should be ordered by admitting physician if indicated.                       Otelia Sergeant, PharmD, [facility] 12/16/2021 3:36 AM

## 2021-12-16 NOTE — Hospital Course (Signed)
Taken from H&P.  [name] is a 66 y.o. 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  Patient usually uses a wheelchair to ambulate.  Patient did not lose consciousness but as per the wife patient was not doing well yesterday and also was found to have fever.  Patient's Foley catheter was just changed 2 days ago.   Patient was recently admitted for MSSA bacteremia, which was adequately treated and repeat cultures were negative.     ED Course: In the ER patient was initially hypotensive tachycardic and febrile.  Patient's UA is concerning for possible UTI was started on cefepime.  Cultures obtained admitted for further observation.  Was started on fluids.  CT head and C-spine unremarkable chest x-ray unremarkable.  On exam patient's sacral decubitus ulcer does not have any discharge.  The left heel wound does have mild discharge.  He was started on cefepime for concern of UTI.  6/18: Preliminary blood cultures negative.  Urine cultures pending, UA with pyuria but patient has chronic indwelling Foley catheter.

## 2021-12-16 NOTE — ED Provider Notes (Signed)
[facility] Provider Note    Event Date/Time   First MD Initiated Contact with Patient 12/16/21 0131     (approximate)   History   Fever and Fall   HPI  [name] is a 66 y.o. male history of hypertension, diabetes, blood clots on Eliquis, TBI, bipolar disorder who presents to the emergency department with EMS for concerns for fall, generalized weakness and found to be febrile.  Patient tells me that his wife was concerned that he was ill and that he had a UTI.  He has an indwelling Foley catheter in place and a large sacral decubitus ulcer.  He denies any injury from his fall.  He is not sure why he fell.  He uses a wheelchair mostly but is able to walk with a walker.  He denies chest pain, shortness of breath, cough, abdominal pain, vomiting, diarrhea, neck or back pain.   History provided by patient, EMS, wife.    Past Medical History:  Diagnosis Date   Bipolar disorder (HCC)    Diabetes mellitus without complication (HCC)    History of blood clots    Hypertension    TBI (traumatic brain injury) (HCC)     Past Surgical History:  Procedure Laterality Date   BACK SURGERY     CARPAL TUNNEL RELEASE Bilateral    COLON SURGERY     COLOSTOMY     IR PERC TUN PERIT CATH WO PORT S&I /IMAG  10/11/2021   IR REMOVAL TUN CV CATH W/O FL  11/19/2021   IR US GUIDE VASC ACCESS RIGHT  10/11/2021   TONSILLECTOMY      MEDICATIONS:  Prior to Admission medications   Medication Sig Start Date End Date Taking? Authorizing Provider  amLODipine (NORVASC) 5 MG tablet Take 5 mg by mouth daily. 08/15/21  Yes [provider]  amLODipine-olmesartan (AZOR) 5-20 MG tablet Take 1 tablet by mouth daily. 11/14/21  Yes [provider]  ascorbic acid (VITAMIN C) 500 MG tablet Take 1 tablet (500 mg total) by mouth daily. 07/31/21  Yes Enedina Finner, MD  cholecalciferol (VITAMIN D) 25 MCG tablet Take 1 tablet (1,000 Units total) by mouth daily. 07/31/21  Yes  Enedina Finner, MD  ELIQUIS 5 MG TABS tablet Take 5 mg by mouth 2 (two) times daily.  11/17/18  Yes [provider]  fish oil-omega-3 fatty acids 1000 MG capsule SMARTSIG:1 Capsule(s) By Mouth Daily 10/12/21  Yes [provider]  glipiZIDE (GLUCOTROL XL) 2.5 MG 24 hr tablet Take 1 tablet (2.5 mg total) by mouth daily with breakfast. 06/25/21  Yes Wieting, Richard, MD  memantine (NAMENDA) 10 MG tablet Take 10 mg by mouth 2 (two) times daily.   Yes [provider]  Oxcarbazepine (TRILEPTAL) 300 MG tablet Take 1 tablet (300 mg total) by mouth 2 (two) times daily. 08/30/21  Yes [name], MD  QUEtiapine (SEROQUEL) 100 MG tablet Take 1 tablet (100 mg total) by mouth at bedtime. 08/30/21  Yes Eappen, Levin Bacon, MD  QUEtiapine (SEROQUEL) 50 MG tablet TAKE 1/2 TO 1 (ONE-HALF TO ONE) TABLET BY MOUTH ONCE DAILY AS NEEDED FOR  AGITATION 11/26/21  Yes Eappen, Levin Bacon, MD  rosuvastatin (CRESTOR) 20 MG tablet Take 20 mg by mouth daily.  08/29/17  Yes [provider]  vitamin B-12 (CYANOCOBALAMIN) 1000 MCG tablet Take 3,000 mcg by mouth daily.   Yes [provider]  zinc sulfate 220 (50 Zn) MG capsule Take 1 capsule (220 mg total) by mouth daily.  07/31/21  Yes Fritzi Mandes, MD    Physical Exam   Triage Vital Signs: ED Triage Vitals  Enc Vitals Group     BP 12/15/21 2346 114/66     Pulse Rate 12/15/21 2346 90     Resp 12/15/21 2346 20     Temp 12/15/21 2346 (!) 100.4 F (38 C)     Temp Source 12/15/21 2346 Oral     SpO2 12/15/21 2346 100 %     Weight 12/16/21 0021 250 lb (113.4 kg)     Height 12/16/21 0021 5\' 6"  (1.676 m)     Head Circumference --      Peak Flow --      Pain Score 12/16/21 0021 0     Pain Loc --      Pain Edu? --      Excl. in [place]? --     Most recent vital signs: Vitals:   12/16/21 0330 12/16/21 0600  BP: 118/69 (!) 91/57  Pulse: 76 75  Resp: 18 15  Temp:  97.8 F (36.6 C)  SpO2: 100% 98%    CONSTITUTIONAL: Alert and oriented x3 and  responds appropriately to questions but slow to answer questions.  Obese, afebrile, nontoxic HEAD: Normocephalic, atraumatic EYES: Conjunctivae clear, pupils appear equal, sclera nonicteric ENT: normal nose; moist mucous membranes NECK: Supple, normal ROM, no midline spinal tenderness or step-off or deformity CARD: RRR; S1 and S2 appreciated; no murmurs, no clicks, no rubs, no gallops RESP: Normal chest excursion without splinting or tachypnea; breath sounds clear and equal bilaterally; no wheezes, no rhonchi, no rales, no hypoxia or respiratory distress, speaking full sentences ABD/GI: Normal bowel sounds; non-distended; soft, non-tender, no rebound, no guarding, no peritoneal signs BACK: The back appears normal; large stage IV sacral decubitus ulcer without signs of surrounding redness, warmth, drainage, bleeding.  He has granulation tissue noted.  No midline spinal tenderness or step-off or deformity.  EXT: Normal ROM in all joints; no deformity noted, no edema; no cyanosis SKIN: Normal color for age and race; warm; no rash on exposed skin NEURO: Moves all extremities equally, normal speech, no facial asymmetry PSYCH: The patient's mood and manner are appropriate.   ED Results / Procedures / Treatments   LABS: (all labs ordered are listed, but only abnormal results are displayed) Labs Reviewed  COMPREHENSIVE METABOLIC PANEL - Abnormal; Notable for the following components:      Result Value   Sodium 133 (*)    Potassium 5.9 (*)    CO2 17 (*)    Glucose, Bld 202 (*)    BUN 35 (*)    Creatinine, Ser 1.74 (*)    Calcium 8.2 (*)    Albumin 2.6 (*)    GFR, Estimated 43 (*)    All other components within normal limits  CBC WITH DIFFERENTIAL/PLATELET - Abnormal; Notable for the following components:   RBC 3.08 (*)    Hemoglobin 8.2 (*)    HCT 27.1 (*)    RDW 17.2 (*)    All other components within normal limits  PROTIME-INR - Abnormal; Notable for the following components:    Prothrombin Time 19.4 (*)    INR 1.7 (*)    All other components within normal limits  URINALYSIS, ROUTINE W REFLEX MICROSCOPIC - Abnormal; Notable for the following components:   Color, Urine YELLOW (*)    APPearance CLOUDY (*)    Hgb urine dipstick SMALL (*)    Protein, ur 100 (*)    Leukocytes,Ua  LARGE (*)    WBC, UA >50 (*)    Bacteria, UA FEW (*)    All other components within normal limits  COMPREHENSIVE METABOLIC PANEL - Abnormal; Notable for the following components:   Sodium 133 (*)    CO2 18 (*)    Glucose, Bld 154 (*)    BUN 35 (*)    Creatinine, Ser 1.84 (*)    Calcium 8.2 (*)    Albumin 2.5 (*)    GFR, Estimated 40 (*)    Anion gap 4 (*)    All other components within normal limits  CBC WITH DIFFERENTIAL/PLATELET - Abnormal; Notable for the following components:   RBC 3.11 (*)    Hemoglobin 8.3 (*)    HCT 27.2 (*)    RDW 17.4 (*)    All other components within normal limits  SARS CORONAVIRUS 2 BY RT PCR  CULTURE, BLOOD (ROUTINE X 2)  URINE CULTURE  CULTURE, BLOOD (ROUTINE X 2) W REFLEX TO ID PANEL  LACTIC ACID, PLASMA  POTASSIUM     EKG:   RADIOLOGY: My personal review and interpretation of imaging: CT head and cervical spine show no acute traumatic injury.  Chest x-ray clear.  I have personally reviewed all radiology reports.   CT HEAD WO CONTRAST (5MM)  Result Date: 12/16/2021 CLINICAL DATA:  66 year old male with history of trauma from a fall. Fever. EXAM: CT HEAD WITHOUT CONTRAST CT CERVICAL SPINE WITHOUT CONTRAST TECHNIQUE: Multidetector CT imaging of the head and cervical spine was performed following the standard protocol without intravenous contrast. Multiplanar CT image reconstructions of the cervical spine were also generated. RADIATION DOSE REDUCTION: This exam was performed according to the departmental dose-optimization program which includes automated exposure control, adjustment of the mA and/or kV according to patient size and/or use of  iterative reconstruction technique. COMPARISON:  No priors. FINDINGS: CT HEAD FINDINGS Brain: Patchy areas of decreased attenuation are noted throughout the deep and periventricular white matter of the cerebral hemispheres bilaterally, compatible with mild chronic microvascular ischemic disease. No evidence of acute infarction, hemorrhage, hydrocephalus, extra-axial collection or mass lesion/mass effect. Vascular: No hyperdense vessel or unexpected calcification. Skull: Normal. Negative for fracture or focal lesion. Sinuses/Orbits: No acute finding. Other: None. CT CERVICAL SPINE FINDINGS Alignment: Normal. Skull base and vertebrae: No acute fracture. No primary bone lesion or focal pathologic process. Soft tissues and spinal canal: No prevertebral fluid or swelling. No visible canal hematoma. Disc levels: Multilevel degenerative disc disease most severe at C3-C4, C4-C5, C5-C6 and C6-C7, with extensive osteophytosis throughout these levels. Mild multilevel facet arthropathy. Upper chest: Visualized portions are unremarkable. Other: None. IMPRESSION: 1. No evidence of significant acute traumatic injury to the skull, brain or cervical spine. 2. Mild chronic microvascular ischemic changes in the cerebral white matter, as above. 3. Multilevel degenerative disc disease and cervical spondylosis, as above. Electronically Signed   By: Vinnie Langton M.D.   On: 12/16/2021 05:11   CT Cervical Spine Wo Contrast  Result Date: 12/16/2021 CLINICAL DATA:  66 year old male with history of trauma from a fall. Fever. EXAM: CT HEAD WITHOUT CONTRAST CT CERVICAL SPINE WITHOUT CONTRAST TECHNIQUE: Multidetector CT imaging of the head and cervical spine was performed following the standard protocol without intravenous contrast. Multiplanar CT image reconstructions of the cervical spine were also generated. RADIATION DOSE REDUCTION: This exam was performed according to the departmental dose-optimization program which includes  automated exposure control, adjustment of the mA and/or kV according to patient size and/or use of iterative  reconstruction technique. COMPARISON:  No priors. FINDINGS: CT HEAD FINDINGS Brain: Patchy areas of decreased attenuation are noted throughout the deep and periventricular white matter of the cerebral hemispheres bilaterally, compatible with mild chronic microvascular ischemic disease. No evidence of acute infarction, hemorrhage, hydrocephalus, extra-axial collection or mass lesion/mass effect. Vascular: No hyperdense vessel or unexpected calcification. Skull: Normal. Negative for fracture or focal lesion. Sinuses/Orbits: No acute finding. Other: None. CT CERVICAL SPINE FINDINGS Alignment: Normal. Skull base and vertebrae: No acute fracture. No primary bone lesion or focal pathologic process. Soft tissues and spinal canal: No prevertebral fluid or swelling. No visible canal hematoma. Disc levels: Multilevel degenerative disc disease most severe at C3-C4, C4-C5, C5-C6 and C6-C7, with extensive osteophytosis throughout these levels. Mild multilevel facet arthropathy. Upper chest: Visualized portions are unremarkable. Other: None. IMPRESSION: 1. No evidence of significant acute traumatic injury to the skull, brain or cervical spine. 2. Mild chronic microvascular ischemic changes in the cerebral white matter, as above. 3. Multilevel degenerative disc disease and cervical spondylosis, as above. Electronically Signed   By: Trudie Reedaniel  Entrikin M.D.   On: 12/16/2021 05:11   DG Chest 1 View  Result Date: 12/16/2021 CLINICAL DATA:  Sepsis, fever EXAM: CHEST  1 VIEW COMPARISON:  10/06/2021 FINDINGS: Lungs volumes are small, but are symmetric and are clear. No pneumothorax or pleural effusion. Cardiac size within normal limits. Pulmonary vascularity is normal. Osseous structures are age-appropriate. No acute bone abnormality. IMPRESSION: Pulmonary hypoinflation. Electronically Signed   By: Helyn NumbersAshesh  Parikh M.D.   On:  12/16/2021 01:45     PROCEDURES:  Critical Care performed: No    .1-3 Lead EKG Interpretation  Performed by: Madsen Riddle, Layla MawKristen N, DO Authorized by: Daiwik Buffalo, Layla MawKristen N, DO     Interpretation: normal     ECG rate:  75   ECG rate assessment: normal     Rhythm: sinus rhythm     Ectopy: none     Conduction: normal       IMPRESSION / MDM / ASSESSMENT AND PLAN / ED COURSE  I reviewed the triage vital signs and the nursing notes.    Patient here with generalized weakness, fever.  Wife concern for possible UTI per patient's report.  He also had a fall tonight but denies any injury.  The patient is on the cardiac monitor to evaluate for evidence of arrhythmia and/or significant heart rate changes.   DIFFERENTIAL DIAGNOSIS (includes but not limited to):   Intracranial hemorrhage, skull fracture, cervical spine fracture, concussion, UTI, dehydration, anemia, electrolyte derangement, sepsis, bacteremia   Patient's presentation is most consistent with acute presentation with potential threat to life or bodily function.   PLAN: We will obtain CBC, CMP, urinalysis, urine culture, blood cultures, chest x-ray, CT head and cervical spine.  Will give Tylenol for fever.   MEDICATIONS GIVEN IN ED: Medications  0.9 %  sodium chloride infusion ( Intravenous New Bag/Given 12/16/21 0545)  rosuvastatin (CRESTOR) tablet 20 mg (has no administration in time range)  memantine (NAMENDA) tablet 10 mg (has no administration in time range)  QUEtiapine (SEROQUEL) tablet 100 mg (has no administration in time range)  QUEtiapine (SEROQUEL) tablet 25 mg (has no administration in time range)  apixaban (ELIQUIS) tablet 5 mg (has no administration in time range)  vitamin B-12 (CYANOCOBALAMIN) tablet 3,000 mcg (has no administration in time range)  Oxcarbazepine (TRILEPTAL) tablet 300 mg (has no administration in time range)  ascorbic acid (VITAMIN C) tablet 500 mg (has no administration in time range)  cholecalciferol (VITAMIN D3) tablet 1,000 Units (has no administration in time range)  omega-3 acid ethyl esters (LOVAZA) capsule 1 g (has no administration in time range)  zinc sulfate capsule 220 mg (has no administration in time range)  insulin aspart (novoLOG) injection 0-9 Units (has no administration in time range)  acetaminophen (TYLENOL) tablet 650 mg (has no administration in time range)    Or  acetaminophen (TYLENOL) suppository 650 mg (has no administration in time range)  ceFEPIme (MAXIPIME) 2 g in sodium chloride 0.9 % 100 mL IVPB (has no administration in time range)  sodium chloride 0.9 % bolus 1,000 mL (0 mLs Intravenous Stopped 12/16/21 0337)  acetaminophen (TYLENOL) tablet 1,000 mg (1,000 mg Oral Given 12/16/21 0230)  ceFEPIme (MAXIPIME) 2 g in sodium chloride 0.9 % 100 mL IVPB (0 g Intravenous Stopped 12/16/21 0538)     ED COURSE: Labs show no leukocytosis.  Chronic and stable anemia.  Stable chronic kidney disease.  Has a metabolic acidosis that is also stable with normal anion gap.  Urine does appear grossly infected.  Urine culture pending.  COVID-negative.  Chest x-ray clear.  Initial potassium level was 5.9 but this is likely due to hemolysis.  On recheck it is 4.3.  Lactic normal.  Chest x-ray and CT of the head and cervical spine reviewed/interpreted by myself and radiology and show no acute abnormality.  He has had previous MSSA bacteremia.  Will give cefepime for his UTI and admit.  Have attempted to get in touch with his wife twice without any answer.  Left HIPAA compliant voicemail.   6:09 AM  Pt's wife called - he has been weak, confused tonight.  Sleeping more than normal.  She told his doctor recently that his urine smelled bad and she thought he was developing a UTI.  He was not started on antibiotics.  She feels his sacral wound is getting better.  He can walk but mostly in a WC.  He can use a walker.  He lives with his wife.  He fell onto the floor when she was  trying to get him in the [facility].  She tried to catch him but was not able to stop his fall.  CONSULTS:  Consulted and discussed patient's case with hospitalist, Dr. Toniann Fail.  I have recommended admission and consulting physician agrees and will place admission orders.  Patient (and family if present) agree with this plan.   I reviewed all nursing notes, vitals, pertinent previous records.  All labs, EKGs, imaging ordered have been independently reviewed and interpreted by myself.    OUTSIDE RECORDS REVIEWED: Reviewed patient's last admission in April 2023 for sepsis.       FINAL CLINICAL IMPRESSION(S) / ED DIAGNOSES   Final diagnoses:  Fall, initial encounter  Fever, unspecified fever cause  Acute UTI  Generalized weakness  Pressure injury of sacral region, stage 4 (HCC)     Rx / DC Orders   ED Discharge Orders     None        Note:  This document was prepared using Dragon voice recognition software and may include unintentional dictation errors.   Suan Pyeatt, Layla Maw, DO 12/16/21 [phone]

## 2021-12-17 ENCOUNTER — Telehealth: Payer: Self-pay | Admitting: Physician Assistant

## 2021-12-17 DIAGNOSIS — F3170 Bipolar disorder, currently in remission, most recent episode unspecified: Secondary | ICD-10-CM | POA: Diagnosis not present

## 2021-12-17 DIAGNOSIS — N390 Urinary tract infection, site not specified: Secondary | ICD-10-CM | POA: Diagnosis not present

## 2021-12-17 DIAGNOSIS — R509 Fever, unspecified: Secondary | ICD-10-CM | POA: Diagnosis not present

## 2021-12-17 LAB — GLUCOSE, CAPILLARY
Glucose-Capillary: 97 mg/dL (ref 70–99)
Glucose-Capillary: 151 mg/dL — ABNORMAL HIGH (ref 70–99)

## 2021-12-17 MED ORDER — CHLORHEXIDINE GLUCONATE CLOTH 2 % EX PADS
6.0000 | MEDICATED_PAD | Freq: Every day | CUTANEOUS | Status: DC
  Administered 2021-12-17: 6 via TOPICAL

## 2021-12-17 MED ORDER — CEPHALEXIN 500 MG PO CAPS: 500.0000 mg | ORAL_CAPSULE | Freq: Three times a day (TID) | ORAL | 0 refills | Status: AC

## 2021-12-17 NOTE — Consult Note (Signed)
NAME: [name]  DOB: 11-02-1955  MRN: [id number]  Date/Time: 12/17/2021 11:21 AM  REQUESTING PROVIDER: Dr. Nelson Chimes Subjective:  REASON FOR CONSULT: fever  [name] is a 66 y.o. male with a history of DM,TBI with paraparesis, neurogenic bladder has indwellig foley, diverting colostomy, sacral and heel decubitus followed at wound clinic, recently treated for MSSA bacteremia and sacral osteomyelitis for 6 weeks in April 2023 Presents from home after a fall-. He was at the sink when he fell and his wife called EMS-and he was brought to the ED around 11.30pm on 12/15/21 In the ED vitals was BP 114/66, temp 100.4, pulse 90 and sats of 100% Labs revealed WBC 6.8, HB 8.3, PLT 176 and cr 1.84 Pt had seen urologist on 12/14/21 for a foley catheter change due to urinary retention. Partner was concerned that the urine was malodorous , but the patient did  not have any abdominal pain or flank pain or fever during the visit   Past Medical History:  Diagnosis Date   Bipolar disorder (HCC)    Diabetes mellitus without complication (HCC)    History of blood clots    Hypertension    TBI (traumatic brain injury) (HCC)     Past Surgical History:  Procedure Laterality Date   BACK SURGERY     CARPAL TUNNEL RELEASE Bilateral    COLON SURGERY     COLOSTOMY     IR PERC TUN PERIT CATH WO PORT S&I /IMAG  10/11/2021   IR REMOVAL TUN CV CATH W/O FL  11/19/2021   IR US GUIDE VASC ACCESS RIGHT  10/11/2021   TONSILLECTOMY      Social History   Socioeconomic History   Marital status: Married    Spouse name: thelma   Number of children: 0   Years of education: Not on file   Highest education level: Associate degree: occupational, Scientist, product/process development, or vocational program  Occupational History   Not on file  Tobacco Use   Smoking status: Never    Passive exposure: Never   Smokeless tobacco: Never  Vaping Use   Vaping Use: Never used  Substance and Sexual Activity   Alcohol use: Yes   Drug  use: No   Sexual activity: Not on file  Other Topics Concern   Not on file  Social History Narrative   Not on file   Social Determinants of Health   Financial Resource Strain: Low Risk  (09/09/2017)   Overall Financial Resource Strain (CARDIA)    Difficulty of Paying Living Expenses: Not hard at all  Food Insecurity: No Food Insecurity (09/09/2017)   Hunger Vital Sign    Worried About Running Out of Food in the Last Year: Never true    Ran Out of Food in the Last Year: Never true  Transportation Needs: No Transportation Needs (09/09/2017)   PRAPARE - Administrator, Civil Service (Medical): No    Lack of Transportation (Non-Medical): No  Physical Activity: Inactive (09/09/2017)   Exercise Vital Sign    Days of Exercise per Week: 0 days    Minutes of Exercise per Session: 0 min  Stress: No Stress Concern Present (09/09/2017)   [employer] of Occupational Health - Occupational Stress Questionnaire    Feeling of Stress : Not at all  Social Connections: Unknown (09/09/2017)   Social Connection and Isolation Panel [NHANES]    Frequency of Communication with Friends and Family: Not on file    Frequency of Social Gatherings with Friends and Family:  Not on file    Attends Religious Services: More than 4 times per year    Active Member of Clubs or Organizations: No    Attends Banker Meetings: Never    Marital Status: Married  Catering manager Violence: Not At Risk (09/09/2017)   Humiliation, Afraid, Rape, and Kick questionnaire    Fear of Current or Ex-Partner: No    Emotionally Abused: No    Physically Abused: No    Sexually Abused: No    Family History  Problem Relation Age of Onset   Depression Father    Deep vein thrombosis Father    No Known Allergies I? Current Facility-Administered Medications  Medication Dose Route Frequency Provider Last Rate Last Admin   acetaminophen (TYLENOL) tablet 650 mg  650 mg Oral Q6H PRN Eduard Clos, MD        Or   acetaminophen (TYLENOL) suppository 650 mg  650 mg Rectal Q6H PRN Eduard Clos, MD       apixaban Everlene Balls) tablet 5 mg  5 mg Oral BID Eduard Clos, MD   5 mg at 12/17/21 1004   ascorbic acid (VITAMIN C) tablet 500 mg  500 mg Oral Daily Eduard Clos, MD   500 mg at 12/17/21 1005   ceFEPIme (MAXIPIME) 2 g in sodium chloride 0.9 % 100 mL IVPB  2 g Intravenous Q12H Otelia Sergeant, RPH 200 mL/hr at 12/17/21 0507 2 g at 12/17/21 0507   Chlorhexidine Gluconate Cloth 2 % PADS 6 each  6 each Topical Daily Eduard Clos, MD   6 each at 12/17/21 1012   cholecalciferol (VITAMIN D3) tablet 1,000 Units  1,000 Units Oral Daily Eduard Clos, MD   1,000 Units at 12/17/21 1005   insulin aspart (novoLOG) injection 0-9 Units  0-9 Units Subcutaneous TID WC Eduard Clos, MD   1 Units at 12/16/21 1640   memantine (NAMENDA) tablet 10 mg  10 mg Oral BID Eduard Clos, MD   10 mg at 12/17/21 1007   omega-3 acid ethyl esters (LOVAZA) capsule 1 g  1 capsule Oral Daily Eduard Clos, MD   1 g at 12/17/21 1008   Oxcarbazepine (TRILEPTAL) tablet 300 mg  300 mg Oral BID Eduard Clos, MD   300 mg at 12/17/21 1009   QUEtiapine (SEROQUEL) tablet 100 mg  100 mg Oral QHS Eduard Clos, MD   100 mg at 12/16/21 2247   QUEtiapine (SEROQUEL) tablet 25 mg  25 mg Oral Daily PRN Eduard Clos, MD       rosuvastatin (CRESTOR) tablet 20 mg  20 mg Oral QHS Eduard Clos, MD   20 mg at 12/16/21 2247   vitamin B-12 (CYANOCOBALAMIN) tablet 3,000 mcg  3,000 mcg Oral Daily Eduard Clos, MD   3,000 mcg at 12/17/21 1009   zinc sulfate capsule 220 mg  220 mg Oral Daily Eduard Clos, MD   220 mg at 12/17/21 1011     Abtx:  Anti-infectives (From admission, onward)    Start     Dose/Rate Route Frequency Ordered Stop   12/16/21 1800  ceFEPIme (MAXIPIME) 2 g in sodium chloride 0.9 % 100 mL IVPB        2 g 200 mL/hr over 30 Minutes  Intravenous Every 12 hours 12/16/21 0527     12/16/21 0345  ceFEPIme (MAXIPIME) 2 g in sodium chloride 0.9 % 100 mL IVPB  2 g 200 mL/hr over 30 Minutes Intravenous STAT 12/16/21 0335 12/16/21 0538       REVIEW OF SYSTEMS:  Const:  fever, negative chills, negative weight loss Eyes: negative diplopia or visual changes, negative eye pain ENT: negative coryza, negative sore throat Resp: negative cough, hemoptysis, dyspnea Cards: negative for chest pain, palpitations, lower extremity edema GU: has foley GI: Negative for abdominal pain, diarrhea, bleeding, constipation Skin: negative for rash and pruritus Heme: negative for easy bruising and gum/nose bleeding MS: muscle weakness Neurolo:weakness lower extremities  Psych:  anxiety, depression  Endocrine: negative for thyroid, diabetes Allergy/Immunology- negative for any medication or food allergies  Objective:  VITALS:  BP 115/64 (BP Location: Right Arm)   Pulse 74   Temp 98.4 F (36.9 C)   Resp 16   Ht 5\' 6"  (1.676 m)   Wt 113.4 kg   SpO2 98%   BMI 40.35 kg/m  LDA Foley Diverting colostomy PHYSICAL EXAM:  General: Alert, cooperative, no distress, appears stated age.  Head: Normocephalic, without obvious abnormality, atraumatic. Eyes: Conjunctivae clear, anicteric sclerae. Pupils are equal ENT Nares normal. No drainage or sinus tenderness. Lips, mucosa, and tongue normal. No Thrush Neck:symmetrical, no adenopathy, thyroid: non tender no carotid bruit and no JVD. Back: No CVA tenderness. Lungs: Clear to auscultation bilaterally. No Wheezing or Rhonchi. No rales. Heart: Regular rate and rhythm, no murmur, rub or gallop. Abdomen: Soft, non-tender,not distended. Diverting colostomy  Bowel sounds normal. No masses Extremities: b/l heel ulcers  Sacral ulcer clean and very small Stage III Skin: No rashes or lesions. Or bruising Lymph: Cervical, supraclavicular normal. Neurologic: paraparesis Pertinent Labs Lab  Results CBC    Component Value Date/Time   WBC 6.8 12/16/2021 0546   RBC 3.11 (L) 12/16/2021 0546   HGB 8.3 (L) 12/16/2021 0546   HCT 27.2 (L) 12/16/2021 0546   PLT 176 12/16/2021 0546   MCV 87.5 12/16/2021 0546   MCH 26.7 12/16/2021 0546   MCHC 30.5 12/16/2021 0546   RDW 17.4 (H) 12/16/2021 0546   LYMPHSABS 1.2 12/16/2021 0546   MONOABS 0.8 12/16/2021 0546   EOSABS 0.1 12/16/2021 0546   BASOSABS 0.0 12/16/2021 0546       Latest Ref Rng & Units 12/16/2021    5:46 AM 12/16/2021    2:38 AM 12/16/2021   12:20 AM  CMP  Glucose 70 - 99 mg/dL 154   202   BUN 8 - 23 mg/dL 35   35   Creatinine 0.61 - 1.24 mg/dL 1.84   1.74   Sodium 135 - 145 mmol/L 133   133   Potassium 3.5 - 5.1 mmol/L 4.6  4.3  5.9   Chloride 98 - 111 mmol/L 111   109   CO2 22 - 32 mmol/L 18   17   Calcium 8.9 - 10.3 mg/dL 8.2   8.2   Total Protein 6.5 - 8.1 g/dL 7.6   8.1   Total Bilirubin 0.3 - 1.2 mg/dL 0.6   1.0   Alkaline Phos 38 - 126 U/L 91   95   AST 15 - 41 U/L 15   22   ALT 0 - 44 U/L 13   17       Microbiology: Recent Results (from the past 240 hour(s))  Culture, blood (Routine x 2)     Status: None (Preliminary result)   Collection Time: 12/16/21 12:20 AM   Specimen: BLOOD  Result Value Ref Range Status   Specimen Description BLOOD RIGTH WRIST  Final   Special Requests   Final    BOTTLES DRAWN AEROBIC AND ANAEROBIC Blood Culture adequate volume   Culture   Final    NO GROWTH < 12 HOURS Performed at [facility], [address] [place], [place] [address]    Report Status PENDING  Incomplete  Urine Culture     Status: Abnormal (Preliminary result)   Collection Time: 12/16/21  2:38 AM   Specimen: Urine, Clean Catch  Result Value Ref Range Status   Specimen Description   Final    URINE, CLEAN CATCH Performed at [facility], [address] [place], [place] [address]    Special Requests   Final    NONE Performed at [facility], [address],  [place], [place] [address]    Culture (A)  Final    >=100,000 COLONIES/mL Lonell Grandchild NEGATIVE RODS SUSCEPTIBILITIES TO FOLLOW Performed at [facility], [place] [address], [place], [place] [address]    Report Status PENDING  Incomplete  SARS Coronavirus 2 by RT PCR (hospital order, performed in [facility] hospital lab) *cepheid single result test* Urine, Catheterized     Status: None   Collection Time: 12/16/21  2:38 AM   Specimen: Urine, Catheterized; Nasal Swab  Result Value Ref Range Status   SARS Coronavirus 2 by RT PCR NEGATIVE NEGATIVE Final    Comment: (NOTE) SARS-CoV-2 target nucleic acids are NOT DETECTED.  The SARS-CoV-2 RNA is generally detectable in upper and lower respiratory specimens during the acute phase of infection. The lowest concentration of SARS-CoV-2 viral copies this assay can detect is 250 copies / mL. A negative result does not preclude SARS-CoV-2 infection and should not be used as the sole basis for treatment or other patient management decisions.  A negative result may occur with improper specimen collection / handling, submission of specimen other than nasopharyngeal swab, presence of viral mutation(s) within the areas targeted by this assay, and inadequate number of viral copies (<250 copies / mL). A negative result must be combined with clinical observations, patient history, and epidemiological information.  Fact Sheet for Patients:   https://www.patel.info/  Fact Sheet for Healthcare Providers: https://hall.com/  This test is not yet approved or  cleared by the [place] FDA and has been authorized for detection and/or diagnosis of SARS-CoV-2 by FDA under an Emergency Use Authorization (EUA).  This EUA will remain in effect (meaning this test can be used) for the duration of the COVID-19 declaration under Section 564(b)(1) of the Act, 21 U.S.C. section 360bbb-3(b)(1), unless the authorization is terminated  or revoked sooner.  Performed at [facility], [address] [place], [place] [address]     IMAGING RESULTS: CNS- none acute I have personally reviewed the films ? Impression/Recommendation ?66 yr male with paraparesis, neurogenic bladder, has foley, sacral decubitus ulcer Presented after a fall. Low grade fever Pt was admitted as UTI, but not sure whether he really has one- He will always be colonized with bacteria in the bladder due to foley catheter. Proteus in urine culture. Blood culture neg- pt is on iv vanco and cefepime- can be discontinued As he is very stable will be discharged on PO keflex for 3 days  Sacra decubitus  -small and very clean and not infected  Heel ulcers followed at wound clinic.  Treated MSSA bacteremia and osteo in April 2023 ? ___________________________________________________ Discussed with patient, requesting provider Note:  This document was prepared using Dragon voice recognition software and may include  unintentional dictation errors.

## 2021-12-17 NOTE — Progress Notes (Signed)
Discharge summary given to spouse over phone with no questions or concerns noted.

## 2021-12-17 NOTE — Telephone Encounter (Signed)
Pt's wife wanted to make [place] aware he was in ER over the weekend and he did have a UTI.

## 2021-12-17 NOTE — TOC Progression Note (Signed)
Transition of Care [facility]) - Progression Note    Patient Details  Name: [name] MRN: [id number] Date of Birth: 09/04/1955  Transition of Care [facility]) CM/SW Contact  Marlowe Sax, RN Phone Number: 12/17/2021, 2:33 PM  Clinical Narrative:    EMS called for the patient to transport home, There are 6 patients Ahead of him onm the list        Expected Discharge Plan and Services           Expected Discharge Date: 12/17/21                                     Social Determinants of Health (SDOH) Interventions    Readmission Risk Interventions    04/15/2021    2:50 PM  Readmission Risk Prevention Plan  Transportation Screening Complete  PCP or Specialist Appt within 3-5 Days Complete  HRI or Home Care Consult Complete  Social Work Consult for Recovery Care Planning/Counseling Complete  Palliative Care Screening Not Applicable  Medication Review [employer]) Complete

## 2021-12-17 NOTE — Plan of Care (Signed)

## 2021-12-17 NOTE — Discharge Summary (Signed)
Physician Discharge Summary   [name] MRN: [id number] DOB: [birth year]  Admit date:     12/16/2021  Discharge date: 12/17/21  Discharge Physician: Arnetha Courser   PCP: Dione Booze, MD   Recommendations at discharge:  Please ensure completion of antibiotics. Follow-up with primary care provider within a week Continue with wound care Continue with physical therapy  Discharge Diagnoses: Principal Problem:   Fever Active Problems:   Bipolar disorder (HCC)   Hyperlipidemia   Hypertension   Type II diabetes mellitus with renal manifestations (HCC)   Bipolar I disorder, most recent episode mixed (HCC)   Stage 3b chronic kidney disease (CKD) (HCC) - baseline SCr 1.8-1.9   Acute UTI  Hospital Course: Taken from H&P.  [name] is a 66 y.o. 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  Patient usually uses a wheelchair to ambulate.  Patient did not lose consciousness but as per the wife patient was not doing well yesterday and also was found to have fever.  Patient's Foley catheter was just changed 2 days ago.  Foley catheter was changed again in ED.   Patient was recently admitted for MSSA bacteremia, which was adequately treated and repeat cultures were negative.     ED Course: In the ER patient was initially hypotensive tachycardic and febrile.  Patient's UA is concerning for possible UTI was started on cefepime.  Cultures obtained admitted for further observation.  Was started on fluids.  CT head and C-spine unremarkable chest x-ray unremarkable.  On exam patient's sacral decubitus ulcer does not have any discharge.  The left heel wound does have mild discharge.  He was started on cefepime for concern of  UTI.  6/18: Preliminary blood cultures negative.  Urine cultures pending, UA with pyuria but patient has chronic indwelling Foley catheter.  6/19: Patient remained afebrile, blood cultures remain negative, urine cultures with Proteus Mirabella's.  Patient received cefepime while in the hospital and discharged on 5 more days of Keflex.  Patient did not had any leukocytosis, just 1 episode of fever after having a fall.  Unable to determine the reason for fall, patient was trying to wash himself and walk without any walker or cane which he normally uses it.  Patient mostly stays in wheelchair.  Patient has a chronic Foley catheter and most likely have bacterial colonization. He has chronic wounds but they does not look infected and he will continue with his routine wound care. Patient will resume his home health services and follow-up with his providers for further recommendations.  Assessment and Plan: No notes have been filed under this hospital service. Service: Hospitalist     Consultants: Infectious disease Procedures performed: None Disposition: Home health Diet recommendation:  Discharge Diet Orders (From admission, onward)     Start     Ordered   12/17/21 0000  Diet - low sodium heart healthy        12/17/21 1332           Cardiac and Carb modified diet DISCHARGE MEDICATION: Allergies as of 12/17/2021   No Known Allergies      Medication List     STOP taking these medications    amLODipine 5 MG tablet Commonly known as: NORVASC  TAKE these medications    amLODipine-olmesartan 5-20 MG tablet Commonly known as: AZOR Take 1 tablet by mouth daily.   ascorbic acid 500 MG tablet Commonly known as: VITAMIN C Take 1 tablet (500 mg total) by mouth daily.   cephALEXin 500 MG capsule Commonly known as: KEFLEX Take 1 capsule (500 mg total) by mouth 3 (three) times daily for 5 days.   Eliquis 5 MG Tabs tablet Generic drug: apixaban Take 5 mg by mouth 2  (two) times daily.   fish oil-omega-3 fatty acids 1000 MG capsule SMARTSIG:1 Capsule(s) By Mouth Daily   glipiZIDE 2.5 MG 24 hr tablet Commonly known as: GLUCOTROL XL Take 1 tablet (2.5 mg total) by mouth daily with breakfast.   memantine 10 MG tablet Commonly known as: NAMENDA Take 10 mg by mouth 2 (two) times daily.   Oxcarbazepine 300 MG tablet Commonly known as: TRILEPTAL Take 1 tablet (300 mg total) by mouth 2 (two) times daily.   QUEtiapine 100 MG tablet Commonly known as: SEROQUEL Take 1 tablet (100 mg total) by mouth at bedtime.   QUEtiapine 50 MG tablet Commonly known as: SEROQUEL TAKE 1/2 TO 1 (ONE-HALF TO ONE) TABLET BY MOUTH ONCE DAILY AS NEEDED FOR  AGITATION   rosuvastatin 20 MG tablet Commonly known as: CRESTOR Take 20 mg by mouth daily.   vitamin B-12 1000 MCG tablet Commonly known as: CYANOCOBALAMIN Take 3,000 mcg by mouth daily.   Vitamin D3 25 MCG tablet Commonly known as: Vitamin D Take 1 tablet (1,000 Units total) by mouth daily.   zinc sulfate 220 (50 Zn) MG capsule Take 1 capsule (220 mg total) by mouth daily.        Follow-up Information     [name], MD. Schedule an appointment as soon as possible for a visit in 1 week(s).   Specialty: Internal Medicine Contact information: [facility] [address] [phone]                Discharge Exam: Danley Danker Weights   12/16/21 0021  Weight: 113.4 kg   General.  Morbidly obese gentleman, in no acute distress. Pulmonary.  Lungs clear bilaterally, normal respiratory effort. CV.  Regular rate and rhythm, no JVD, rub or murmur. Abdomen.  Soft, nontender, nondistended, BS positive. CNS.  Alert and oriented .  No focal neurologic deficit. Extremities.  No edema, no cyanosis, pulses intact and symmetrical. Psychiatry.  Judgment and insight appears normal.   Condition at discharge: stable  The results of significant diagnostics from this hospitalization (including  imaging, microbiology, ancillary and laboratory) are listed below for reference.   Imaging Studies: DG Foot 2 Views Left  Result Date: 12/16/2021 CLINICAL DATA:  66 year old male with history of chronic bilateral heel wounds with history of fall. EXAM: LEFT FOOT - 2 VIEW COMPARISON:  Left ankle radiograph 10/06/2021. FINDINGS: Three views two views of the left foot demonstrate no definite acute displaced fracture. There is some lucency in the medial aspect of the base of the fifth proximal phalanx, with surrounding sclerosis. No other definite destructive osseous lesions are noted. Advanced degenerative changes are noted throughout the foot, with widespread joint space narrowing, subchondral sclerosis, subchondral cyst formation and osteophyte formation, indicative of osteoarthritis. Lateral projection demonstrates soft tissue irregularity posterior to the calcaneus, presumably the reported soft tissue ulcer. Some areas of irregular sclerosis are noted in the dorsal aspect of the calcaneus, slightly increased in prominence compared to the prior examination. Diffuse soft tissue swelling. IMPRESSION: 1. Apparent  soft tissue ulcer dorsal to the calcaneus with increasing areas of sclerosis in the dorsal aspect of the calcaneus. The possibility of chronic osteomyelitis is not excluded. There is also a lucent area in the medial aspect of the base of the fifth proximal phalanx, which could be degenerative, but an additional focus of infection is not excluded, particularly in this diabetic patient. Given these findings in the diffuse soft tissue swelling in the foot, consideration for further evaluation with left foot MRI with and without IV gadolinium is suggested. Electronically Signed   By: Trudie Reed M.D.   On: 12/16/2021 07:19   DG Pelvis 1-2 Views  Result Date: 12/16/2021 CLINICAL DATA:  66 year old male history of trauma from a fall. EXAM: PELVIS - 1-2 VIEW COMPARISON:  No priors. FINDINGS: Single AP  view of the bony pelvis demonstrates no definite acute displaced fracture of the bony pelvic ring or either of the proximal femurs as visualized. Bilateral femoral heads appear located on this single view examination. There is extensive joint space narrowing, subchondral sclerosis, subchondral cyst formation and osteophyte formation in the hip joints bilaterally, indicative of advanced osteoarthritis. IMPRESSION: 1. No acute radiographic abnormality of the bony pelvis. 2. Advanced bilateral hip joint osteoarthritis. Electronically Signed   By: Trudie Reed M.D.   On: 12/16/2021 07:16   CT HEAD WO CONTRAST ( )  Result Date: 12/16/2021 CLINICAL DATA:  66 year old male with history of trauma from a fall. Fever. EXAM: CT HEAD WITHOUT CONTRAST CT CERVICAL SPINE WITHOUT CONTRAST TECHNIQUE: Multidetector CT imaging of the head and cervical spine was performed following the standard protocol without intravenous contrast. Multiplanar CT image reconstructions of the cervical spine were also generated. RADIATION DOSE REDUCTION: This exam was performed according to the departmental dose-optimization program which includes automated exposure control, adjustment of the mA and/or kV according to patient size and/or use of iterative reconstruction technique. COMPARISON:  No priors. FINDINGS: CT HEAD FINDINGS Brain: Patchy areas of decreased attenuation are noted throughout the deep and periventricular white matter of the cerebral hemispheres bilaterally, compatible with mild chronic microvascular ischemic disease. No evidence of acute infarction, hemorrhage, hydrocephalus, extra-axial collection or mass lesion/mass effect. Vascular: No hyperdense vessel or unexpected calcification. Skull: Normal. Negative for fracture or focal lesion. Sinuses/Orbits: No acute finding. Other: None. CT CERVICAL SPINE FINDINGS Alignment: Normal. Skull base and vertebrae: No acute fracture. No primary bone lesion or focal pathologic process.  Soft tissues and spinal canal: No prevertebral fluid or swelling. No visible canal hematoma. Disc levels: Multilevel degenerative disc disease most severe at C3-C4, C4-C5, C5-C6 and C6-C7, with extensive osteophytosis throughout these levels. Mild multilevel facet arthropathy. Upper chest: Visualized portions are unremarkable. Other: None. IMPRESSION: 1. No evidence of significant acute traumatic injury to the skull, brain or cervical spine. 2. Mild chronic microvascular ischemic changes in the cerebral white matter, as above. 3. Multilevel degenerative disc disease and cervical spondylosis, as above. Electronically Signed   By: Trudie Reed M.D.   On: 12/16/2021 05:11   CT Cervical Spine Wo Contrast  Result Date: 12/16/2021 CLINICAL DATA:  66 year old male with history of trauma from a fall. Fever. EXAM: CT HEAD WITHOUT CONTRAST CT CERVICAL SPINE WITHOUT CONTRAST TECHNIQUE: Multidetector CT imaging of the head and cervical spine was performed following the standard protocol without intravenous contrast. Multiplanar CT image reconstructions of the cervical spine were also generated. RADIATION DOSE REDUCTION: This exam was performed according to the departmental dose-optimization program which includes automated exposure control, adjustment of the mA and/or  kV according to patient size and/or use of iterative reconstruction technique. COMPARISON:  No priors. FINDINGS: CT HEAD FINDINGS Brain: Patchy areas of decreased attenuation are noted throughout the deep and periventricular white matter of the cerebral hemispheres bilaterally, compatible with mild chronic microvascular ischemic disease. No evidence of acute infarction, hemorrhage, hydrocephalus, extra-axial collection or mass lesion/mass effect. Vascular: No hyperdense vessel or unexpected calcification. Skull: Normal. Negative for fracture or focal lesion. Sinuses/Orbits: No acute finding. Other: None. CT CERVICAL SPINE FINDINGS Alignment: Normal. Skull  base and vertebrae: No acute fracture. No primary bone lesion or focal pathologic process. Soft tissues and spinal canal: No prevertebral fluid or swelling. No visible canal hematoma. Disc levels: Multilevel degenerative disc disease most severe at C3-C4, C4-C5, C5-C6 and C6-C7, with extensive osteophytosis throughout these levels. Mild multilevel facet arthropathy. Upper chest: Visualized portions are unremarkable. Other: None. IMPRESSION: 1. No evidence of significant acute traumatic injury to the skull, brain or cervical spine. 2. Mild chronic microvascular ischemic changes in the cerebral white matter, as above. 3. Multilevel degenerative disc disease and cervical spondylosis, as above. Electronically Signed   By: Vinnie Langton M.D.   On: 12/16/2021 05:11   DG Chest 1 View  Result Date: 12/16/2021 CLINICAL DATA:  Sepsis, fever EXAM: CHEST  1 VIEW COMPARISON:  10/06/2021 FINDINGS: Lungs volumes are small, but are symmetric and are clear. No pneumothorax or pleural effusion. Cardiac size within normal limits. Pulmonary vascularity is normal. Osseous structures are age-appropriate. No acute bone abnormality. IMPRESSION: Pulmonary hypoinflation. Electronically Signed   By: Fidela Salisbury M.D.   On: 12/16/2021 01:45   IR Removal Tun Cv Cath W/O FL  Result Date: 11/19/2021 INDICATION: Patient with history of osteomyelitis and previously placed tunneled central venous catheter for long-term antibiotic therapy. Patient no longer requires intravenous antibiotic therapy, request received for catheter removal. EXAM: REMOVAL TUNNELED CENTRAL VENOUS CATHETER MEDICATIONS: Local 1% lidocaine only. ANESTHESIA/SEDATION: Local 1% lidocaine only. FLUOROSCOPY: None. COMPLICATIONS: None immediate. PROCEDURE: Informed written consent was obtained from the patient after a thorough discussion of the procedural risks, benefits and alternatives. All questions were addressed. Maximal Sterile Barrier Technique was utilized  including caps, mask, sterile gowns, sterile gloves, sterile drape, hand hygiene and skin antiseptic. A timeout was performed prior to the initiation of the procedure. The patient's right chest and catheter was prepped and draped in a normal sterile fashion. 1% lidocaine was used for local anesthesia. Using gentle blunt dissection the cuff of the catheter was exposed and the catheter was removed in it's entirety. Pressure was held till hemostasis was obtained. A sterile dressing was applied. The patient tolerated the procedure well with no immediate complications. IMPRESSION: Successful right internal jugular tunneled central venous catheter removal as described above. This exam was performed by Tsosie Billing PA-C, and was supervised and interpreted by Dr. Laurence Ferrari. Electronically Signed   By: Jacqulynn Cadet M.D.   On: 11/19/2021 15:10    Microbiology: Results for orders placed or performed during the hospital encounter of 12/16/21  Culture, blood (Routine x 2)     Status: None (Preliminary result)   Collection Time: 12/16/21 12:20 AM   Specimen: BLOOD  Result Value Ref Range Status   Specimen Description BLOOD RIGTH WRIST  Final   Special Requests   Final    BOTTLES DRAWN AEROBIC AND ANAEROBIC Blood Culture adequate volume   Culture   Final    NO GROWTH < 12 HOURS Performed at [facility], [address] [place], [place] [address]  Report Status PENDING  Incomplete  Urine Culture     Status: Abnormal (Preliminary result)   Collection Time: 12/16/21  2:38 AM   Specimen: Urine, Clean Catch  Result Value Ref Range Status   Specimen Description   Final    URINE, CLEAN CATCH Performed at [facility], [address], [place], [place] [address]    Special Requests   Final    NONE Performed at [facility], [address], [place], [place] [address]    Culture (A)  Final    >=100,000 COLONIES/mL PROTEUS MIRABILIS SUSCEPTIBILITIES TO FOLLOW Performed at  [facility], [place] [address] [place], [place] [address]    Report Status PENDING  Incomplete  SARS Coronavirus 2 by RT PCR (hospital order, performed in [facility] hospital lab) *cepheid single result test* Urine, Catheterized     Status: None   Collection Time: 12/16/21  2:38 AM   Specimen: Urine, Catheterized; Nasal Swab  Result Value Ref Range Status   SARS Coronavirus 2 by RT PCR NEGATIVE NEGATIVE Final    Comment: (NOTE) SARS-CoV-2 target nucleic acids are NOT DETECTED.  The SARS-CoV-2 RNA is generally detectable in upper and lower respiratory specimens during the acute phase of infection. The lowest concentration of SARS-CoV-2 viral copies this assay can detect is 250 copies / mL. A negative result does not preclude SARS-CoV-2 infection and should not be used as the sole basis for treatment or other patient management decisions.  A negative result may occur with improper specimen collection / handling, submission of specimen other than nasopharyngeal swab, presence of viral mutation(s) within the areas targeted by this assay, and inadequate number of viral copies (<250 copies / mL). A negative result must be combined with clinical observations, patient history, and epidemiological information.  Fact Sheet for Patients:   https://www.patel.info/  Fact Sheet for Healthcare Providers: https://hall.com/  This test is not yet approved or  cleared by the [place] FDA and has been authorized for detection and/or diagnosis of SARS-CoV-2 by FDA under an Emergency Use Authorization (EUA).  This EUA will remain in effect (meaning this test can be used) for the duration of the COVID-19 declaration under Section 564(b)(1) of the Act, 21 U.S.C. section 360bbb-3(b)(1), unless the authorization is terminated or revoked sooner.  Performed at [facility], [address] [place], [place] [address]     Labs: CBC: Recent  Labs  Lab 12/16/21 0020 12/16/21 0546  WBC 8.2 6.8  NEUTROABS 5.8 4.6  HGB 8.2* 8.3*  HCT 27.1* 27.2*  MCV 88.0 87.5  PLT 192 0000000   Basic Metabolic Panel: Recent Labs  Lab 12/16/21 0020 12/16/21 0238 12/16/21 0546  NA 133*  --  133*  K 5.9* 4.3 4.6  CL 109  --  111  CO2 17*  --  18*  GLUCOSE 202*  --  154*  BUN 35*  --  35*  CREATININE 1.74*  --  1.84*  CALCIUM 8.2*  --  8.2*   Liver Function Tests: Recent Labs  Lab 12/16/21 0020 12/16/21 0546  AST 22 15  ALT 17 13  ALKPHOS 95 91  BILITOT 1.0 0.6  PROT 8.1 7.6  ALBUMIN 2.6* 2.5*   CBG: Recent Labs  Lab 12/16/21 1223 12/16/21 1638 12/16/21 2335 12/17/21 0741 12/17/21 1107  GLUCAP 146* 129* 161* 97 151*    Discharge time spent: greater than 30 minutes.  This record has been created using Systems analyst. Errors have been  sought and corrected,but may not always be located. Such creation errors do not reflect on the standard of care.   Signed: Lorella Nimrod, MD [facility] 12/17/2021

## 2021-12-18 LAB — URINE CULTURE: Culture: >=100000 — AB

## 2021-12-19 NOTE — Progress Notes (Signed)
QUALYN, OYERVIDES ([id number]) Visit Report for 12/13/2021 Arrival Information Details Patient Name: [name], [name] Date of Service: 12/13/2021 8:00 AM Medical Record Number: [id number] Patient Account Number: [id number] Date of Birth/Sex: 07-16-1955 (65 y.o. M) Treating RN: [name] Primary Care Caran Storck: Dione Booze Other Clinician: Referring Enez Monahan: Dione Booze Treating Coretta Leisey/Extender: Rowan Blase in Treatment: 3 Visit Information History Since Last Visit All ordered tests and consults were completed: No Patient Arrived: Wheel Chair Added or deleted any medications: No Arrival Time: 08:07 Any new allergies or adverse reactions: No Accompanied By: wife Had a fall or experienced change in No Transfer Assistance: None activities of daily living that may affect Patient Identification Verified: Yes risk of falls: Secondary Verification Process Completed: Yes Signs or symptoms of abuse/neglect since last visito No Patient Requires Transmission-Based No Hospitalized since last visit: No Precautions: Implantable device outside of the clinic excluding No Patient Has Alerts: Yes cellular tissue based products placed in the center Patient Alerts: Patient on Blood since last visit: Thinner Has Dressing in Place as Prescribed: Yes Pain Present Now: No Electronic Signature(s) Signed: 12/19/2021 2:40:31 PM By: [name] RN Entered By: [name] on 12/13/2021 08:13:38 Martensen, [name] ([id number]) -------------------------------------------------------------------------------- Clinic Level of Care Assessment Details Patient Name: [name] Date of Service: 12/13/2021 8:00 AM Medical Record Number: [id number] Patient Account Number: [id number] Date of Birth/Sex: 01-16-1956 (65 y.o. M) Treating RN: [name] Primary Care Lachrisha Ziebarth: Dione Booze Other Clinician: Referring Jamirah Zelaya: Dione Booze Treating  Tetsuo Coppola/Extender: Rowan Blase in Treatment: 3 Clinic Level of Care Assessment Items TOOL 1 Quantity Score []  - Use when EandM and Procedure is performed on INITIAL visit 0 ASSESSMENTS - Nursing Assessment / Reassessment []  - General Physical Exam (combine w/ comprehensive assessment (listed just below) when performed on new 0 pt. evals) []  - 0 Comprehensive Assessment (HX, ROS, Risk Assessments, Wounds Hx, etc.) ASSESSMENTS - Wound and Skin Assessment / Reassessment []  - Dermatologic / Skin Assessment (not related to wound area) 0 ASSESSMENTS - Ostomy and/or Continence Assessment and Care []  - Incontinence Assessment and Management 0 []  - 0 Ostomy Care Assessment and Management (repouching, etc.) PROCESS - Coordination of Care []  - Simple Patient / Family Education for ongoing care 0 []  - 0 Complex (extensive) Patient / Family Education for ongoing care []  - 0 Staff obtains , Records, Test Results / Process Orders []  - 0 Staff telephones HHA, Nursing Homes / Clarify orders / etc []  - 0 Routine Transfer to another Facility (non-emergent condition) []  - 0 Routine Hospital Admission (non-emergent condition) []  - 0 New Admissions / / Ordering NPWT, Apligraf, etc. []  - 0 Emergency Hospital Admission (emergent condition) PROCESS - Special Needs []  - Pediatric / Minor Patient Management 0 []  - 0 Isolation Patient Management []  - 0 Hearing / Language / Visual special needs []  - 0 Assessment of Community assistance (transportation, D/C planning, etc.) []  - 0 Additional assistance / Altered mentation []  - 0 Support Surface(s) Assessment (bed, cushion, seat, etc.) INTERVENTIONS - Miscellaneous []  - External ear exam 0 []  - 0 Patient Transfer (multiple staff / / Similar devices) []  - 0 Simple Staple / Suture removal (25 or less) []  - 0 Complex Staple / Suture removal (26 or more) []  - 0 Hypo/Hyperglycemic Management (do not  check if billed separately) []  - 0 Ankle / Brachial Index (ABI) - do not check if billed separately Has the patient been seen at the hospital within the last three years: Yes Total  Score: 0 Level Of Care: ____ [name] ([id number]) Electronic Signature(s) Signed: 12/19/2021 2:40:31 PM By: [name] RN Entered By: [name] on 12/13/2021 08:37:03 Geter, [name] ([id number]) -------------------------------------------------------------------------------- Encounter Discharge Information Details Patient Name: [name] Date of Service: 12/13/2021 8:00 AM Medical Record Number: [id number] Patient Account Number: [id number] Date of Birth/Sex: 07-Jul-1955 (65 y.o. M) Treating RN: [name] Primary Care Orla Jolliff: [name] Other Clinician: Referring Romello Hoehn: [name] Treating Keary [name]/Extender: Skipper Cliche in Treatment: 3 Encounter Discharge Information Items Post Procedure Vitals Discharge Condition: Stable Temperature (F): 97.8 Ambulatory Status: Wheelchair Pulse (bpm): 128 Discharge Destination: Home Respiratory Rate (breaths/min): 18 Transportation: Private Auto Blood Pressure (mmHg): 185/77 Accompanied By: wife Schedule Follow-up Appointment: Yes Clinical Summary of Care: Electronic Signature(s) Signed: 12/19/2021 2:40:31 PM By: [name] RN Entered By: [name] on 12/13/2021 08:38:04 Dung, [name] ([id number]) -------------------------------------------------------------------------------- Lower Extremity Assessment Details Patient Name: [name] Date of Service: 12/13/2021 8:00 AM Medical Record Number: [id number] Patient Account Number: [id number] Date of Birth/Sex: 06/24/1956 (65 y.o. M) Treating RN: [name] Primary Care Suanne Minahan: [name] Other Clinician: Referring Tasha Jindra: [name] Treating Sundy Houchins/Extender: Jeri Cos Weeks in Treatment: 3 Electronic  Signature(s) Signed: 12/19/2021 2:40:31 PM By: [name] RN Entered By: [name] on 12/13/2021 08:27:18 Lea, [name] ([id number]) -------------------------------------------------------------------------------- Multi Wound Chart Details Patient Name: [name] Date of Service: 12/13/2021 8:00 AM Medical Record Number: [id number] Patient Account Number: [id number] Date of Birth/Sex: 1955/08/31 (65 y.o. M) Treating RN: [name] Primary Care Deserea Bordley: [name] Other Clinician: Referring Saniyya Gau: [name] Treating Broox Lonigro/Extender: Skipper Cliche in Treatment: 3 Vital Signs Height(in): 1 Pulse(bpm): 128 Weight(lbs): 279 Blood Pressure(mmHg): 185/77 Body Mass Index(BMI): 45 Temperature(F): 97.8 Respiratory Rate(breaths/min): 18 Photos: Wound Location: Right Calcaneus Left Calcaneus Sacrum Wounding Event: Pressure Injury Pressure Injury Pressure Injury Primary Etiology: Pressure Ulcer Pressure Ulcer Pressure Ulcer Comorbid History: Anemia, Sleep Apnea, Coronary Anemia, Sleep Apnea, Coronary Anemia, Sleep Apnea, Coronary Artery Disease, Deep Vein Artery Disease, Deep Vein Artery Disease, Deep Vein Thrombosis, Hypertension, Type II Thrombosis, Hypertension, Type II Thrombosis, Hypertension, Type II Diabetes, History of pressure Diabetes, History of pressure Diabetes, History of pressure wounds, Neuropathy wounds, Neuropathy wounds, Neuropathy Date Acquired: 07/01/2021 07/01/2021 07/01/2021 Weeks of Treatment: 3 3 3  Wound Status: Open Open Open Wound Recurrence: No No No Measurements L x W x D (cm) 2.1x3.5x0.1 2x3.5x0.1 2x2x3 Area (cm) : 5.773 5.498 3.142 Volume (cm) : 0.577 0.55 9.425 % Reduction in Area: -39.50% 20.00% 0.00% % Reduction in Volume: -39.40% 19.90% 0.00% 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 Exudate Amount: Medium Small Medium Exudate Type: Serosanguineous Serosanguineous  Serosanguineous Exudate Color: red, brown red, brown red, brown Granulation Amount: Large (67-100%) None Present (0%) Large (67-100%) Granulation Quality: Red, Pink N/A Red Necrotic Amount: Small (1-33%) Large (67-100%) Small (1-33%) Necrotic Tissue: Adherent [place] Exposed Structures: Fat Layer (Subcutaneous Tissue): Fascia: No Fat Layer (Subcutaneous Tissue): Yes Fat Layer (Subcutaneous Tissue): Yes Fascia: No No Fascia: No Tendon: No Tendon: No Tendon: No Muscle: No Muscle: No Muscle: No Joint: No Joint: No Joint: No Bone: No Bone: No Bone: No Epithelialization: None None None Wound Number: 8 N/A N/A Photos: N/A N/A MARGUISE, MARTUCCI ([id number]) Wound Location: Right Gluteus N/A N/A Wounding Event: Gradually Appeared N/A N/A Primary Etiology: MASD N/A N/A Comorbid History: Anemia, Sleep Apnea, Coronary N/A N/A Artery Disease, Deep Vein Thrombosis, Hypertension, Type II Diabetes, History of pressure wounds, Neuropathy Date Acquired: 10/29/2021 N/A N/A Weeks of Treatment: 3 N/A N/A Wound Status: Open  N/A N/A Wound Recurrence: No N/A N/A Measurements L x W x D (cm) 6x7x0.1 N/A N/A Area (cm) : 32.987 N/A N/A Volume (cm) : 3.299 N/A N/A % Reduction in Area: 6.70% N/A N/A % Reduction in Volume: 6.60% N/A N/A Classification: Full Thickness Without Exposed N/A N/A Support Structures Exudate Amount: Medium N/A N/A Exudate Type: Serosanguineous N/A N/A Exudate Color: red, brown N/A N/A Granulation Amount: Medium (34-66%) N/A N/A Granulation Quality: Red N/A N/A Necrotic Amount: Medium (34-66%) N/A N/A Necrotic Tissue: Adherent Slough N/A N/A Exposed Structures: Fat Layer (Subcutaneous Tissue): N/A N/A Yes Fascia: No Tendon: No Muscle: No Joint: No Bone: No Epithelialization: None N/A N/A Treatment Notes Electronic Signature(s) Signed: 12/19/2021 2:40:31 PM By: [name] RN Entered By: [name] on 12/13/2021  08:27:27 [name] ([id number]) -------------------------------------------------------------------------------- Multi-Disciplinary Care Plan Details Patient Name: [name] Date of Service: 12/13/2021 8:00 AM Medical Record Number: [id number] Patient Account Number: [id number] Date of Birth/Sex: June 12, 1956 (65 y.o. M) Treating RN: [name] Primary Care Quynh Basso: Dione Booze Other Clinician: Referring Rokia Bosket: Dione Booze Treating Laquasia Pincus/Extender: Rowan Blase in Treatment: 3 Active Inactive Wound/Skin Impairment Nursing Diagnoses: Knowledge deficit related to ulceration/compromised skin integrity Goals: Patient/caregiver will verbalize understanding of skin care regimen Date Initiated: 11/22/2021 Target Resolution Date: 12/23/2021 Goal Status: Active Ulcer/skin breakdown will have a volume reduction of 30% by week 4 Date Initiated: 11/22/2021 Target Resolution Date: 12/23/2021 Goal Status: Active Ulcer/skin breakdown will have a volume reduction of 50% by week 8 Date Initiated: 11/22/2021 Target Resolution Date: 01/22/2022 Goal Status: Active Ulcer/skin breakdown will have a volume reduction of 80% by week 12 Date Initiated: 11/22/2021 Target Resolution Date: 02/22/2022 Goal Status: Active Ulcer/skin breakdown will heal within 14 weeks Date Initiated: 11/22/2021 Target Resolution Date: 03/25/2022 Goal Status: Active Interventions: Assess patient/caregiver ability to obtain necessary supplies Assess patient/caregiver ability to perform ulcer/skin care regimen upon admission and as needed Assess ulceration(s) every visit Notes: Electronic Signature(s) Signed: 12/19/2021 2:40:31 PM By: [name] RN Entered By: [name] on 12/13/2021 08:27:21 Issac, [name] ([id number]) -------------------------------------------------------------------------------- Pain Assessment Details Patient Name: [name] Date of  Service: 12/13/2021 8:00 AM Medical Record Number: [id number] Patient Account Number: [id number] Date of Birth/Sex: 06-13-1956 (65 y.o. M) Treating RN: [name] Primary Care Abbegale Stehle: Dione Booze Other Clinician: Referring Ashur Glatfelter: Dione Booze Treating Gaylin Osoria/Extender: Rowan Blase in Treatment: 3 Active Problems Location of Pain Severity and Description of Pain Patient Has Paino No Site Locations Pain Management and Medication Current Pain Management: Electronic Signature(s) Signed: 12/19/2021 2:40:31 PM By: [name] RN Entered By: [name] on 12/13/2021 08:14:08 Augsburger, [name] ([id number]) -------------------------------------------------------------------------------- Patient/Caregiver Education Details Patient Name: [name] Date of Service: 12/13/2021 8:00 AM Medical Record Number: [id number] Patient Account Number: [id number] Date of Birth/Gender: 18-Apr-1956 (65 y.o. M) Treating RN: [name] Primary Care Physician: Dione Booze Other Clinician: Referring Physician: Dione Booze Treating Physician/Extender: Rowan Blase in Treatment: 3 Education Assessment Education Provided To: Patient Education Topics Provided Wound/Skin Impairment: Methods: Explain/Verbal Responses: State content correctly Electronic Signature(s) Signed: 12/19/2021 2:40:31 PM By: [name] RN Entered By: [name] on 12/13/2021 08:37:18 Ybarbo, [name] ([id number]) -------------------------------------------------------------------------------- Wound Assessment Details Patient Name: [name] Date of Service: 12/13/2021 8:00 AM Medical Record Number: [id number] Patient Account Number: [id number] Date of Birth/Sex: 1956-04-06 (65 y.o. M) Treating RN: [name] Primary Care Jamela Cumbo: Dione Booze Other Clinician: Referring Anuja Manka: Dione Booze Treating Trudee Chirino/Extender: Allen Derry Weeks in  Treatment: 3 Wound Status Wound Number: 5 Primary Pressure Ulcer Etiology: Wound Location:  Right Calcaneus Wound Open Wounding Event: Pressure Injury Status: Date Acquired: 07/01/2021 Comorbid Anemia, Sleep Apnea, Coronary Artery Disease, Deep Vein Weeks Of Treatment: 3 History: Thrombosis, Hypertension, Type II Diabetes, History of Clustered Wound: No pressure wounds, Neuropathy Photos Wound Measurements Length: (cm) 2.1 % Reduc Width: (cm) 3.5 % Reduc Depth: (cm) 0.1 Epithel Area: (cm) 5.773 Tunnel Volume: (cm) 0.577 Underm tion in Area: -39.5% tion in Volume: -39.4% ialization: None ing: No ining: No Wound Description Classification: Category/Stage III Foul Od Exudate Amount: Medium Slough/ Exudate Type: Serosanguineous Exudate Color: red, brown or After Cleansing: No Fibrino Yes Wound Bed Granulation Amount: Large (67-100%) Exposed Structure Granulation Quality: Red, Pink Fascia Exposed: No Necrotic Amount: Small (1-33%) Fat Layer (Subcutaneous Tissue) Exposed: Yes Necrotic Quality: Adherent Slough Tendon Exposed: No Muscle Exposed: No Joint Exposed: No Bone Exposed: No Treatment Notes Wound #5 (Calcaneus) Wound Laterality: Right Cleanser Peri-Wound Care Topical Primary Dressing Trupiano, Carolos ([id number]) Xeroform-HBD 2x2 (in/in) Discharge Instruction: Apply Xeroform-HBD 2x2 (in/in) as directed Secondary Dressing (SILCONE BORDER) Zetuvit Plus SILICONE BORDER Dressing 5x5 (in/in) Discharge Instruction: Please do not put silicone bordered dressings under wraps. Use non-bordered dressing only. Secured With Compression Wrap Compression Stockings Environmental education officer) Signed: 12/19/2021 2:40:31 PM By: [name] RN Entered By: [name] on 12/13/2021 08:25:53 Hargett, [name] ([id number]) -------------------------------------------------------------------------------- Wound Assessment Details Patient Name: [name] Date of Service: 12/13/2021 8:00 AM Medical Record Number: [id number] Patient Account Number: [id number] Date of Birth/Sex: 1955-08-03 (65 y.o. M) Treating RN: [name] Primary Care Javares Kaufhold: [name] Other Clinician: Referring Tanasha Menees: [name] Treating Shanvi Moyd/Extender: Jeri Cos Weeks in Treatment: 3 Wound Status Wound Number: 6 Primary Pressure Ulcer Etiology: Wound Location: Left Calcaneus Wound Open Wounding Event: Pressure Injury Status: Date Acquired: 07/01/2021 Comorbid Anemia, Sleep Apnea, Coronary Artery Disease, Deep Vein Weeks Of Treatment: 3 History: Thrombosis, Hypertension, Type II Diabetes, History of Clustered Wound: No pressure wounds, Neuropathy Photos Wound Measurements Length: (cm) 2 % Reducti Width: (cm) 3.5 % Reducti Depth: (cm) 0.1 Epithelia Area: (cm) 5.498 Tunnelin Volume: (cm) 0.55 Undermin on in Area: 20% on in Volume: 19.9% lization: None g: No ing: No Wound Description Classification: Unstageable/Unclassified Foul Odor Exudate Amount: Small Slough/Fi Exudate Type: Serosanguineous Exudate Color: red, brown After Cleansing: No brino Yes Wound Bed Granulation Amount: None Present (0%) Exposed Structure Necrotic Amount: Large (67-100%) Fascia Exposed: No Necrotic Quality: Eschar Fat Layer (Subcutaneous Tissue) Exposed: No Tendon Exposed: No Muscle Exposed: No Joint Exposed: No Bone Exposed: No Treatment Notes Wound #6 (Calcaneus) Wound Laterality: Left Cleanser Peri-Wound Care Topical Primary Dressing [place], Ryen ([id number]) Xeroform 4x4-HBD (in/in) Discharge Instruction: Apply Xeroform 4x4-HBD (in/in) as directed Secondary Dressing (SILICONE BORDER) Zetuvit Plus SILICONE BORDER Dressing 4x4 (in/in) Discharge Instruction: Please do not put silicone bordered dressings under wraps. Use non-bordered dressing only. Secured With Compression Wrap Compression Stockings Sport and exercise psychologist) Signed: 12/19/2021 2:40:31 PM By: [name] RN Entered By: [name] on 12/13/2021 08:26:16 Dia, [name] ([id number]) -------------------------------------------------------------------------------- Wound Assessment Details Patient Name: [name] Date of Service: 12/13/2021 8:00 AM Medical Record Number: [id number] Patient Account Number: [id number] Date of Birth/Sex: Sep 02, 1955 (65 y.o. M) Treating RN: [name] Primary Care Eisen Robenson: [name] Other Clinician: Referring Analissa Bayless: [name] Treating Arleta Ostrum/Extender: Jeri Cos Weeks in Treatment: 3 Wound Status Wound Number: 7 Primary Pressure Ulcer Etiology: Wound Location: Sacrum Wound Open Wounding Event: Pressure Injury Status: Date Acquired: 07/01/2021 Comorbid Anemia, Sleep Apnea, Coronary Artery Disease, Deep Vein Weeks Of Treatment: 3 History: Thrombosis, Hypertension, Type II Diabetes,  History of Clustered Wound: No pressure wounds, Neuropathy Photos Wound Measurements Length: (cm) 2 % Reduct Width: (cm) 2 % Reduct Depth: (cm) 3 Epitheli Area: (cm) 3.142 Tunneli Volume: (cm) 9.425 Undermi ion in Area: 0% ion in Volume: 0% alization: None ng: No ning: No Wound Description Classification: Category/Stage IV Foul Odo Exudate Amount: Medium Slough/F Exudate Type: Serosanguineous Exudate Color: red, brown r After Cleansing: No ibrino Yes Wound Bed Granulation Amount: Large (67-100%) Exposed Structure Granulation Quality: Red Fascia Exposed: No Necrotic Amount: Small (1-33%) Fat Layer (Subcutaneous Tissue) Exposed: Yes Necrotic Quality: Adherent Slough Tendon Exposed: No Muscle Exposed: No Joint Exposed: No Bone Exposed: No Treatment Notes Wound #7 (Sacrum) Cleanser Peri-Wound Care Topical calmoseptine CHARLS, GROWNEY ([id number]) Discharge Instruction: apply to excorated areas Primary Dressing Secondary Dressing Secured  With Compression Wrap Compression Stockings Add-Ons Electronic Signature(s) Signed: 12/19/2021 2:40:31 PM By: [name] RN Entered By: [name] on 12/13/2021 08:26:33 Liller, [name] ([id number]) -------------------------------------------------------------------------------- Wound Assessment Details Patient Name: [name] Date of Service: 12/13/2021 8:00 AM Medical Record Number: [id number] Patient Account Number: [id number] Date of Birth/Sex: 29-Oct-1955 (65 y.o. M) Treating RN: [name] Primary Care Jaquaya Coyle: [name] Other Clinician: Referring Nyles Mitton: [name] Treating Kyley Laurel/Extender: Skipper Cliche in Treatment: 3 Wound Status Wound Number: 8 Primary MASD Etiology: Wound Location: Right Gluteus Wound Open Wounding Event: Gradually Appeared Status: Date Acquired: 10/29/2021 Comorbid Anemia, Sleep Apnea, Coronary Artery Disease, Deep Vein Weeks Of Treatment: 3 History: Thrombosis, Hypertension, Type II Diabetes, History of Clustered Wound: No pressure wounds, Neuropathy Photos Wound Measurements Length: (cm) 6 Width: (cm) 7 Depth: (cm) 0.1 Area: (cm) 32.987 Volume: (cm) 3.299 % Reduction in Area: 6.7% % Reduction in Volume: 6.6% Epithelialization: None Tunneling: No Undermining: No Wound Description Classification: Full Thickness Without Exposed Support Structu Exudate Amount: Medium Exudate Type: Serosanguineous Exudate Color: red, brown res Foul Odor After Cleansing: No Slough/Fibrino Yes Wound Bed Granulation Amount: Medium (34-66%) Exposed Structure Granulation Quality: Red Fascia Exposed: No Necrotic Amount: Medium (34-66%) Fat Layer (Subcutaneous Tissue) Exposed: Yes Necrotic Quality: Adherent Slough Tendon Exposed: No Muscle Exposed: No Joint Exposed: No Bone Exposed: No Treatment Notes Wound #8 (Gluteus) Wound Laterality: Right Cleanser Peri-Wound Care Topical Primary  Dressing Iannuzzi, [name] ([id number]) Gauze Discharge Instruction: As directed: moistened with Dakins Solution Secondary Dressing (SILICONE BORDER) Zetuvit Plus SILICONE BORDER Dressing 4x4 (in/in) Discharge Instruction: Please do not put silicone bordered dressings under wraps. Use non-bordered dressing only. Secured With Compression Wrap Compression Stockings Environmental education officer) Signed: 12/19/2021 2:40:31 PM By: [name] RN Entered By: [name] on 12/13/2021 08:27:04 Wimmer, [name] ([id number]) -------------------------------------------------------------------------------- Vitals Details Patient Name: [name] Date of Service: 12/13/2021 8:00 AM Medical Record Number: [id number] Patient Account Number: [id number] Date of Birth/Sex: Apr 16, 1956 (65 y.o. M) Treating RN: [name] Primary Care Dhiren Azimi: [name] Other Clinician: Referring Benjamine Strout: [name] Treating Bernyce Brimley/Extender: Skipper Cliche in Treatment: 3 Vital Signs Time Taken: 08:13 Temperature (F): 97.8 Height (in): 66 Pulse (bpm): 128 Weight (lbs): 279 Respiratory Rate (breaths/min): 18 Body Mass Index (BMI): 45 Blood Pressure (mmHg): 185/77 Reference Range: 80 - 120 mg / dl Electronic Signature(s) Signed: 12/19/2021 2:40:31 PM By: [name] RN Entered By: [name] on 12/13/2021 08:14:02

## 2021-12-21 ENCOUNTER — Other Ambulatory Visit: Payer: Self-pay | Admitting: Psychiatry

## 2021-12-21 DIAGNOSIS — F3178 Bipolar disorder, in full remission, most recent episode mixed: Secondary | ICD-10-CM

## 2021-12-21 LAB — CULTURE, BLOOD (ROUTINE X 2)
Culture: NO GROWTH
Culture: NO GROWTH
Special Requests: ADEQUATE

## 2021-12-21 NOTE — Telephone Encounter (Signed)
Please let patient know to schedule appointment - will refill medication once appointment scheduled

## 2021-12-24 ENCOUNTER — Telehealth: Payer: Self-pay | Admitting: Psychiatry

## 2021-12-24 ENCOUNTER — Other Ambulatory Visit: Payer: Self-pay | Admitting: Psychiatry

## 2021-12-24 DIAGNOSIS — F3178 Bipolar disorder, in full remission, most recent episode mixed: Secondary | ICD-10-CM

## 2021-12-24 DIAGNOSIS — F411 Generalized anxiety disorder: Secondary | ICD-10-CM

## 2021-12-24 DIAGNOSIS — G4701 Insomnia due to medical condition: Secondary | ICD-10-CM

## 2021-12-24 MED ORDER — QUETIAPINE FUMARATE 100 MG PO TABS: 100.0000 mg | ORAL_TABLET | Freq: Every day | ORAL | 0 refills | Status: DC

## 2021-12-24 MED ORDER — OXCARBAZEPINE 300 MG PO TABS: 300.0000 mg | ORAL_TABLET | Freq: Two times a day (BID) | ORAL | 0 refills | Status: DC

## 2021-12-24 MED ORDER — QUETIAPINE FUMARATE 50 MG PO TABS: ORAL_TABLET | ORAL | 0 refills | Status: DC

## 2021-12-24 NOTE — Telephone Encounter (Signed)
I have sent Seroquel, oxcarbazepine to [facility].  Patient has upcoming appointment on 01/15/2022.

## 2021-12-31 ENCOUNTER — Other Ambulatory Visit: Payer: Self-pay

## 2022-01-03 ENCOUNTER — Ambulatory Visit: Payer: Medicare Other | Admitting: Physician Assistant

## 2022-01-04 ENCOUNTER — Emergency Department: Payer: Medicare Other

## 2022-01-04 ENCOUNTER — Inpatient Hospital Stay
Admission: EM | Admit: 2022-01-04 | Discharge: 2022-01-07 | DRG: 698 | Disposition: A | Discharge status: Home Health | Payer: Medicare Other | Attending: Internal Medicine | Admitting: Internal Medicine

## 2022-01-04 ENCOUNTER — Other Ambulatory Visit: Payer: Self-pay

## 2022-01-04 DIAGNOSIS — E871 Hypo-osmolality and hyponatremia: Secondary | ICD-10-CM | POA: Diagnosis present

## 2022-01-04 DIAGNOSIS — D638 Anemia in other chronic diseases classified elsewhere: Secondary | ICD-10-CM | POA: Diagnosis present

## 2022-01-04 DIAGNOSIS — R4182 Altered mental status, unspecified: Principal | ICD-10-CM

## 2022-01-04 DIAGNOSIS — L89154 Pressure ulcer of sacral region, stage 4: Secondary | ICD-10-CM | POA: Diagnosis present

## 2022-01-04 DIAGNOSIS — N319 Neuromuscular dysfunction of bladder, unspecified: Secondary | ICD-10-CM | POA: Diagnosis present

## 2022-01-04 DIAGNOSIS — T83518A Infection and inflammatory reaction due to other urinary catheter, initial encounter: Secondary | ICD-10-CM | POA: Diagnosis not present

## 2022-01-04 DIAGNOSIS — L89620 Pressure ulcer of left heel, unstageable: Secondary | ICD-10-CM | POA: Diagnosis present

## 2022-01-04 DIAGNOSIS — Z20822 Contact with and (suspected) exposure to covid-19: Secondary | ICD-10-CM | POA: Diagnosis present

## 2022-01-04 DIAGNOSIS — I10 Essential (primary) hypertension: Secondary | ICD-10-CM | POA: Diagnosis present

## 2022-01-04 DIAGNOSIS — N10 Acute tubulo-interstitial nephritis: Secondary | ICD-10-CM | POA: Diagnosis present

## 2022-01-04 DIAGNOSIS — G9341 Metabolic encephalopathy: Secondary | ICD-10-CM | POA: Diagnosis present

## 2022-01-04 DIAGNOSIS — D649 Anemia, unspecified: Secondary | ICD-10-CM

## 2022-01-04 DIAGNOSIS — Z86718 Personal history of other venous thrombosis and embolism: Secondary | ICD-10-CM

## 2022-01-04 DIAGNOSIS — Z7901 Long term (current) use of anticoagulants: Secondary | ICD-10-CM

## 2022-01-04 DIAGNOSIS — Y846 Urinary catheterization as the cause of abnormal reaction of the patient, or of later complication, without mention of misadventure at the time of the procedure: Secondary | ICD-10-CM | POA: Diagnosis present

## 2022-01-04 DIAGNOSIS — Z7984 Long term (current) use of oral hypoglycemic drugs: Secondary | ICD-10-CM

## 2022-01-04 DIAGNOSIS — L8990 Pressure ulcer of unspecified site, unspecified stage: Secondary | ICD-10-CM

## 2022-01-04 DIAGNOSIS — Z7401 Bed confinement status: Secondary | ICD-10-CM

## 2022-01-04 DIAGNOSIS — N189 Chronic kidney disease, unspecified: Secondary | ICD-10-CM

## 2022-01-04 DIAGNOSIS — Z6841 Body Mass Index (BMI) 40.0 and over, adult: Secondary | ICD-10-CM

## 2022-01-04 DIAGNOSIS — F0283 Dementia in other diseases classified elsewhere, unspecified severity, with mood disturbance: Secondary | ICD-10-CM | POA: Diagnosis present

## 2022-01-04 DIAGNOSIS — D631 Anemia in chronic kidney disease: Secondary | ICD-10-CM | POA: Diagnosis present

## 2022-01-04 DIAGNOSIS — E1122 Type 2 diabetes mellitus with diabetic chronic kidney disease: Secondary | ICD-10-CM | POA: Diagnosis present

## 2022-01-04 DIAGNOSIS — L89610 Pressure ulcer of right heel, unstageable: Secondary | ICD-10-CM | POA: Diagnosis present

## 2022-01-04 DIAGNOSIS — Z978 Presence of other specified devices: Secondary | ICD-10-CM

## 2022-01-04 DIAGNOSIS — G9340 Encephalopathy, unspecified: Secondary | ICD-10-CM

## 2022-01-04 DIAGNOSIS — A419 Sepsis, unspecified organism: Secondary | ICD-10-CM | POA: Diagnosis present

## 2022-01-04 DIAGNOSIS — N1832 Chronic kidney disease, stage 3b: Secondary | ICD-10-CM | POA: Diagnosis present

## 2022-01-04 DIAGNOSIS — F0390 Unspecified dementia without behavioral disturbance: Secondary | ICD-10-CM | POA: Diagnosis present

## 2022-01-04 DIAGNOSIS — F319 Bipolar disorder, unspecified: Secondary | ICD-10-CM | POA: Diagnosis present

## 2022-01-04 DIAGNOSIS — Z933 Colostomy status: Secondary | ICD-10-CM

## 2022-01-04 DIAGNOSIS — R509 Fever, unspecified: Secondary | ICD-10-CM

## 2022-01-04 DIAGNOSIS — Z79899 Other long term (current) drug therapy: Secondary | ICD-10-CM

## 2022-01-04 DIAGNOSIS — Z87820 Personal history of traumatic brain injury: Secondary | ICD-10-CM

## 2022-01-04 DIAGNOSIS — S31000D Unspecified open wound of lower back and pelvis without penetration into retroperitoneum, subsequent encounter: Secondary | ICD-10-CM

## 2022-01-04 DIAGNOSIS — I129 Hypertensive chronic kidney disease with stage 1 through stage 4 chronic kidney disease, or unspecified chronic kidney disease: Secondary | ICD-10-CM | POA: Diagnosis present

## 2022-01-04 DIAGNOSIS — A4151 Sepsis due to Escherichia coli [E. coli]: Secondary | ICD-10-CM | POA: Diagnosis present

## 2022-01-04 DIAGNOSIS — E1129 Type 2 diabetes mellitus with other diabetic kidney complication: Secondary | ICD-10-CM | POA: Diagnosis present

## 2022-01-04 DIAGNOSIS — M25451 Effusion, right hip: Secondary | ICD-10-CM | POA: Diagnosis present

## 2022-01-04 LAB — CBC WITH DIFFERENTIAL/PLATELET
Abs Immature Granulocytes: 0.09 10*3/uL — ABNORMAL HIGH (ref 0.00–0.07)
Basophils Absolute: 0 10*3/uL (ref 0.0–0.1)
Basophils Relative: 0 %
Eosinophils Absolute: 0 10*3/uL (ref 0.0–0.5)
Eosinophils Relative: 0 %
HCT: 27 % — ABNORMAL LOW (ref 39.0–52.0)
Hemoglobin: 8.3 g/dL — ABNORMAL LOW (ref 13.0–17.0)
Immature Granulocytes: 1 %
Lymphocytes Relative: 9 %
Lymphs Abs: 1 10*3/uL (ref 0.7–4.0)
MCH: 26.5 pg (ref 26.0–34.0)
MCHC: 30.7 g/dL (ref 30.0–36.0)
MCV: 86.3 fL (ref 80.0–100.0)
Monocytes Absolute: 1.5 10*3/uL — ABNORMAL HIGH (ref 0.1–1.0)
Monocytes Relative: 13 %
Neutro Abs: 8.6 10*3/uL — ABNORMAL HIGH (ref 1.7–7.7)
Neutrophils Relative %: 77 %
Platelets: 184 10*3/uL (ref 150–400)
RBC: 3.13 MIL/uL — ABNORMAL LOW (ref 4.22–5.81)
RDW: 17.6 % — ABNORMAL HIGH (ref 11.5–15.5)
WBC: 11.2 10*3/uL — ABNORMAL HIGH (ref 4.0–10.5)
nRBC: 0 % (ref 0.0–0.2)

## 2022-01-04 LAB — APTT: aPTT: 46 seconds — ABNORMAL HIGH (ref 24–36)

## 2022-01-04 LAB — COMPREHENSIVE METABOLIC PANEL
ALT: 14 U/L (ref 0–44)
AST: 20 U/L (ref 15–41)
Albumin: 2.8 g/dL — ABNORMAL LOW (ref 3.5–5.0)
Alkaline Phosphatase: 99 U/L (ref 38–126)
Anion gap: 6 (ref 5–15)
BUN: 27 mg/dL — ABNORMAL HIGH (ref 8–23)
CO2: 20 mmol/L — ABNORMAL LOW (ref 22–32)
Calcium: 8.1 mg/dL — ABNORMAL LOW (ref 8.9–10.3)
Chloride: 101 mmol/L (ref 98–111)
Creatinine, Ser: 1.93 mg/dL — ABNORMAL HIGH (ref 0.61–1.24)
GFR, Estimated: 38 mL/min — ABNORMAL LOW (ref >60)
Glucose, Bld: 194 mg/dL — ABNORMAL HIGH (ref 70–99)
Potassium: 5.2 mmol/L — ABNORMAL HIGH (ref 3.5–5.1)
Sodium: 127 mmol/L — ABNORMAL LOW (ref 135–145)
Total Bilirubin: 1.2 mg/dL (ref 0.3–1.2)
Total Protein: 8.5 g/dL — ABNORMAL HIGH (ref 6.5–8.1)

## 2022-01-04 LAB — SARS CORONAVIRUS 2 BY RT PCR: SARS Coronavirus 2 by RT PCR: NEGATIVE

## 2022-01-04 LAB — LACTIC ACID, PLASMA: Lactic Acid, Venous: 1.4 mmol/L (ref 0.5–1.9)

## 2022-01-04 LAB — PROTIME-INR
INR: 1.5 — ABNORMAL HIGH (ref 0.8–1.2)
Prothrombin Time: 18.4 seconds — ABNORMAL HIGH (ref 11.4–15.2)

## 2022-01-04 LAB — AMMONIA: Ammonia: 19 umol/L (ref 9–35)

## 2022-01-04 NOTE — ED Triage Notes (Signed)
Patient brought in via medic per spouse for confusion and dark urine. Patient took tylenol around 2100, temp 101.4 when medic arrived. Temp 99.6 on arrival. Patient recently treated for uti and just finished abx.

## 2022-01-04 NOTE — ED Provider Notes (Signed)
[facility] Provider Note    Event Date/Time   First MD Initiated Contact with Patient 01/04/22 2229     (approximate)   History   Altered Mental Status   HPI  [name] is a 66 y.o. 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  Patient reports he is emergency room because of fever.  He denies feeling confused or any acute pain including chest pain, abdominal pain, back pain rash or extremity pain.  States he did have some nausea earlier.  I was able to speak with his wife he states he had some very repetitive questioning today and she noted a fever today with nausea and vomiting.  No other sick symptoms per wife.  Patient did receive some Tylenol earlier today prior to arrival.  No recent falls or injuries.    Past Medical History:  Diagnosis Date   Bipolar disorder (HCC)    Diabetes mellitus without complication (HCC)    History of blood clots    Hypertension    TBI (traumatic brain injury) [facility])      Physical Exam  Triage Vital Signs: ED Triage Vitals  Enc Vitals Group     BP      Pulse      Resp      Temp      Temp src      SpO2      Weight      Height      Head Circumference      Peak Flow      Pain Score      Pain Loc      Pain Edu?      Excl. in GC?     Most recent vital signs: Vitals:   01/04/22 2223 01/04/22 2249  BP: 129/64   Pulse: 93   Resp: 17   Temp: 99.6 F (37.6 C)   SpO2: 99% 99%    General: Awake, no distress.  CV:  Good peripheral perfusion.  2+ radial pulses Resp:  Normal effort.  Clear bilaterally. Abd:  Slightly distended but soft.  Colostomy bag is in place.  Patient's sacral wound with packing in place which on removal shows fairly clean margins without  any purulence induration or bleeding.  Patient's heel wounds also appear noninfected with some ulceration but no active purulence or surrounding skin changes Other:  Patient is able to follow commands in extremities.  PERRLA.  EOMI.  He is oriented but does have very repetitive questioning.   ED Results / Procedures / Treatments  Labs (all labs ordered are listed, but only abnormal results are displayed) Labs Reviewed  COMPREHENSIVE METABOLIC PANEL - Abnormal; Notable for the following components:      Result Value   Sodium 127 (*)    Potassium 5.2 (*)    CO2 20 (*)    Glucose, Bld 194 (*)    BUN 27 (*)    Creatinine, Ser 1.93 (*)    Calcium 8.1 (*)    Total Protein 8.5 (*)    Albumin 2.8 (*)    GFR, Estimated 38 (*)    All other components within normal limits  CBC WITH DIFFERENTIAL/PLATELET - Abnormal; Notable for the following components:   WBC 11.2 (*)    RBC  3.13 (*)    Hemoglobin 8.3 (*)    HCT 27.0 (*)    RDW 17.6 (*)    Neutro Abs 8.6 (*)    Monocytes Absolute 1.5 (*)    Abs Immature Granulocytes 0.09 (*)    All other components within normal limits  PROTIME-INR - Abnormal; Notable for the following components:   Prothrombin Time 18.4 (*)    INR 1.5 (*)    All other components within normal limits  APTT - Abnormal; Notable for the following components:   aPTT 46 (*)    All other components within normal limits  CULTURE, BLOOD (ROUTINE X 2)  CULTURE, BLOOD (ROUTINE X 2)  URINE CULTURE  SARS CORONAVIRUS 2 BY RT PCR  LACTIC ACID, PLASMA  LACTIC ACID, PLASMA  URINALYSIS, COMPLETE (UACMP) WITH MICROSCOPIC  PROCALCITONIN  AMMONIA     EKG    RADIOLOGY Chest reviewed by myself shows no focal consoidation, effusion, edema, pneumothorax or other clear acute thoracic process. I also reviewed radiology interpretation and agree with findings described.    PROCEDURES:  Critical Care performed: No  .1-3 Lead EKG Interpretation  Performed by: Gilles Chiquito,  MD Authorized by: Gilles Chiquito, MD     Interpretation: normal     ECG rate assessment: normal     Rhythm: sinus rhythm     Ectopy: none     Conduction: normal     The patient is on the cardiac monitor to evaluate for evidence of arrhythmia and/or significant heart rate changes.   MEDICATIONS ORDERED IN ED: Medications - No data to display   IMPRESSION / MDM / ASSESSMENT AND PLAN / ED COURSE  I reviewed the triage vital signs and the nursing notes. Patient's presentation is most consistent with acute presentation with potential threat to life or bodily function.                               Differential diagnosis includes, but is not limited to possible recurrent UTI given patient does have a chronic Foley catheter, pneumonia versus a colitis given reports of some nausea earlier.  His wounds do not appear acutely infected although obtain a CT abdomen pelvis to assess for any deeper pelvic infection or abscess.  Also obtain a chest x-ray to look for any evidence of pneumonia and head CT to assess for other supination such as intracranial hemorrhage ischemia or edema no explain patient's repetitive questioning but I suspect this is more related to acute infection.  Chest reviewed by myself shows no focal consoidation, effusion, edema, pneumothorax or other clear acute thoracic process. I also reviewed radiology interpretation and agree with findings described.  CMP remarkable for sodium of 127 compared to 133 2 weeks ago, K of 5.2, bicarb of 20, glucose of 194, creatinine of 1.93 compared to 1.842 weeks ago with normal LFTs and bilirubin.  CBC shows WC count of 11.2, hemoglobin of 8.3 compared to 8.32 weeks ago and normal platelets.  PTT 46.  INR 1.5.        FINAL CLINICAL IMPRESSION(S) / ED DIAGNOSES   Final diagnoses:  Altered mental status, unspecified altered mental status type  Fever, unspecified fever cause  Hyponatremia  Chronic kidney disease, unspecified CKD stage   Chronic anemia  Wound of sacral region, subsequent encounter  Encephalopathy     Rx / DC Orders   ED Discharge Orders     None  Note:  This document was prepared using Dragon voice recognition software and may include unintentional dictation errors.   Lucrezia Starch, MD 01/04/22 2325

## 2022-01-05 ENCOUNTER — Emergency Department: Payer: Medicare Other

## 2022-01-05 DIAGNOSIS — Z7901 Long term (current) use of anticoagulants: Secondary | ICD-10-CM | POA: Diagnosis not present

## 2022-01-05 DIAGNOSIS — R6520 Severe sepsis without septic shock: Secondary | ICD-10-CM | POA: Diagnosis not present

## 2022-01-05 DIAGNOSIS — D631 Anemia in chronic kidney disease: Secondary | ICD-10-CM | POA: Diagnosis present

## 2022-01-05 DIAGNOSIS — E871 Hypo-osmolality and hyponatremia: Secondary | ICD-10-CM | POA: Diagnosis present

## 2022-01-05 DIAGNOSIS — Z7984 Long term (current) use of oral hypoglycemic drugs: Secondary | ICD-10-CM | POA: Diagnosis not present

## 2022-01-05 DIAGNOSIS — N1832 Chronic kidney disease, stage 3b: Secondary | ICD-10-CM | POA: Diagnosis present

## 2022-01-05 DIAGNOSIS — G9341 Metabolic encephalopathy: Secondary | ICD-10-CM | POA: Diagnosis present

## 2022-01-05 DIAGNOSIS — Z86718 Personal history of other venous thrombosis and embolism: Secondary | ICD-10-CM | POA: Diagnosis not present

## 2022-01-05 DIAGNOSIS — Y846 Urinary catheterization as the cause of abnormal reaction of the patient, or of later complication, without mention of misadventure at the time of the procedure: Secondary | ICD-10-CM | POA: Diagnosis present

## 2022-01-05 DIAGNOSIS — M25451 Effusion, right hip: Secondary | ICD-10-CM | POA: Diagnosis present

## 2022-01-05 DIAGNOSIS — Z933 Colostomy status: Secondary | ICD-10-CM | POA: Diagnosis not present

## 2022-01-05 DIAGNOSIS — F319 Bipolar disorder, unspecified: Secondary | ICD-10-CM | POA: Diagnosis present

## 2022-01-05 DIAGNOSIS — Z6841 Body Mass Index (BMI) 40.0 and over, adult: Secondary | ICD-10-CM | POA: Diagnosis not present

## 2022-01-05 DIAGNOSIS — Z20822 Contact with and (suspected) exposure to covid-19: Secondary | ICD-10-CM | POA: Diagnosis present

## 2022-01-05 DIAGNOSIS — N10 Acute tubulo-interstitial nephritis: Secondary | ICD-10-CM | POA: Diagnosis present

## 2022-01-05 DIAGNOSIS — E1122 Type 2 diabetes mellitus with diabetic chronic kidney disease: Secondary | ICD-10-CM | POA: Diagnosis present

## 2022-01-05 DIAGNOSIS — A419 Sepsis, unspecified organism: Secondary | ICD-10-CM | POA: Diagnosis not present

## 2022-01-05 DIAGNOSIS — N179 Acute kidney failure, unspecified: Secondary | ICD-10-CM | POA: Diagnosis not present

## 2022-01-05 DIAGNOSIS — T83518A Infection and inflammatory reaction due to other urinary catheter, initial encounter: Secondary | ICD-10-CM | POA: Diagnosis present

## 2022-01-05 DIAGNOSIS — Z87820 Personal history of traumatic brain injury: Secondary | ICD-10-CM | POA: Diagnosis not present

## 2022-01-05 DIAGNOSIS — F0283 Dementia in other diseases classified elsewhere, unspecified severity, with mood disturbance: Secondary | ICD-10-CM | POA: Diagnosis present

## 2022-01-05 DIAGNOSIS — L89610 Pressure ulcer of right heel, unstageable: Secondary | ICD-10-CM | POA: Diagnosis present

## 2022-01-05 DIAGNOSIS — L89620 Pressure ulcer of left heel, unstageable: Secondary | ICD-10-CM | POA: Diagnosis present

## 2022-01-05 DIAGNOSIS — A4151 Sepsis due to Escherichia coli [E. coli]: Secondary | ICD-10-CM | POA: Diagnosis present

## 2022-01-05 DIAGNOSIS — N319 Neuromuscular dysfunction of bladder, unspecified: Secondary | ICD-10-CM | POA: Diagnosis present

## 2022-01-05 DIAGNOSIS — L89154 Pressure ulcer of sacral region, stage 4: Secondary | ICD-10-CM | POA: Diagnosis present

## 2022-01-05 DIAGNOSIS — I129 Hypertensive chronic kidney disease with stage 1 through stage 4 chronic kidney disease, or unspecified chronic kidney disease: Secondary | ICD-10-CM | POA: Diagnosis present

## 2022-01-05 LAB — BLOOD CULTURE ID PANEL (REFLEXED) - BCID2

## 2022-01-05 LAB — URINALYSIS, COMPLETE (UACMP) WITH MICROSCOPIC
Bilirubin Urine: NEGATIVE
Glucose, UA: NEGATIVE mg/dL
Ketones, ur: NEGATIVE mg/dL
Nitrite: NEGATIVE
Protein, ur: 100 mg/dL — AB
RBC / HPF: >50 RBC/hpf — ABNORMAL HIGH (ref 0–5)
Specific Gravity, Urine: 1.012 (ref 1.005–1.030)
WBC, UA: >50 WBC/hpf — ABNORMAL HIGH (ref 0–5)
pH: 6 (ref 5.0–8.0)

## 2022-01-05 LAB — HEMOGLOBIN A1C
Hgb A1c MFr Bld: 6.8 % — ABNORMAL HIGH (ref 4.8–5.6)
Mean Plasma Glucose: 148.46 mg/dL

## 2022-01-05 LAB — GLUCOSE, CAPILLARY
Glucose-Capillary: 84 mg/dL (ref 70–99)
Glucose-Capillary: 159 mg/dL — ABNORMAL HIGH (ref 70–99)
Glucose-Capillary: 151 mg/dL — ABNORMAL HIGH (ref 70–99)

## 2022-01-05 LAB — CORTISOL-AM, BLOOD: Cortisol - AM: 20.1 ug/dL (ref 6.7–22.6)

## 2022-01-05 LAB — CBG MONITORING, ED: Glucose-Capillary: 112 mg/dL — ABNORMAL HIGH (ref 70–99)

## 2022-01-05 LAB — LACTIC ACID, PLASMA: Lactic Acid, Venous: 3.3 mmol/L (ref 0.5–1.9)

## 2022-01-05 LAB — PROCALCITONIN: Procalcitonin: 1.02 ng/mL

## 2022-01-05 MED ORDER — SODIUM CHLORIDE 0.9 % IV SOLN
2.0000 g | Freq: Two times a day (BID) | INTRAVENOUS | Status: DC
  Administered 2022-01-05: 2 g via INTRAVENOUS
  Filled 2022-01-05 (×2): qty 12.5

## 2022-01-05 MED ORDER — ROSUVASTATIN CALCIUM 10 MG PO TABS
20.0000 mg | ORAL_TABLET | Freq: Every day | ORAL | Status: DC
  Administered 2022-01-06 – 2022-01-07 (×2): 20 mg via ORAL
  Filled 2022-01-05: qty 2
  Filled 2022-01-05: qty 1
  Filled 2022-01-05: qty 2

## 2022-01-05 MED ORDER — DAKINS (1/4 STRENGTH) 0.125 % EX SOLN
Freq: Two times a day (BID) | CUTANEOUS | Status: DC
  Filled 2022-01-05: qty 473

## 2022-01-05 MED ORDER — SODIUM CHLORIDE 0.9 % IV SOLN
2.0000 g | INTRAVENOUS | Status: DC
  Administered 2022-01-06 – 2022-01-07 (×2): 2 g via INTRAVENOUS
  Filled 2022-01-05: qty 2
  Filled 2022-01-05: qty 20

## 2022-01-05 MED ORDER — LACTATED RINGERS IV BOLUS (SEPSIS)
1000.0000 mL | Freq: Once | INTRAVENOUS | Status: AC
  Administered 2022-01-05: 1000 mL via INTRAVENOUS

## 2022-01-05 MED ORDER — ACETAMINOPHEN 325 MG PO TABS
650.0000 mg | ORAL_TABLET | Freq: Four times a day (QID) | ORAL | Status: DC | PRN
  Administered 2022-01-05 – 2022-01-07 (×2): 650 mg via ORAL
  Filled 2022-01-05 (×2): qty 2

## 2022-01-05 MED ORDER — SODIUM CHLORIDE 0.9 % IV SOLN: 1.0000 g | Freq: Once | INTRAVENOUS | Status: DC

## 2022-01-05 MED ORDER — QUETIAPINE FUMARATE 25 MG PO TABS: 50.0000 mg | ORAL_TABLET | Freq: Every day | ORAL | Status: DC | PRN

## 2022-01-05 MED ORDER — INSULIN ASPART 100 UNIT/ML IJ SOLN
0.0000 [IU] | Freq: Three times a day (TID) | INTRAMUSCULAR | Status: DC
  Administered 2022-01-05: 4 [IU] via SUBCUTANEOUS
  Administered 2022-01-06: 3 [IU] via SUBCUTANEOUS
  Administered 2022-01-06: 4 [IU] via SUBCUTANEOUS
  Administered 2022-01-07: 3 [IU] via SUBCUTANEOUS
  Filled 2022-01-05 (×4): qty 1

## 2022-01-05 MED ORDER — ACETAMINOPHEN 650 MG RE SUPP: 650.0000 mg | Freq: Four times a day (QID) | RECTAL | Status: DC | PRN

## 2022-01-05 MED ORDER — ONDANSETRON HCL 4 MG PO TABS: 4.0000 mg | ORAL_TABLET | Freq: Four times a day (QID) | ORAL | Status: DC | PRN

## 2022-01-05 MED ORDER — QUETIAPINE FUMARATE 25 MG PO TABS
100.0000 mg | ORAL_TABLET | Freq: Every day | ORAL | Status: DC
  Administered 2022-01-05 – 2022-01-06 (×2): 100 mg via ORAL
  Filled 2022-01-05 (×2): qty 4

## 2022-01-05 MED ORDER — LACTATED RINGERS IV SOLN: INTRAVENOUS | Status: AC

## 2022-01-05 MED ORDER — ONDANSETRON HCL 4 MG/2ML IJ SOLN: 4.0000 mg | Freq: Four times a day (QID) | INTRAMUSCULAR | Status: DC | PRN

## 2022-01-05 MED ORDER — INSULIN ASPART 100 UNIT/ML IJ SOLN
0.0000 [IU] | INTRAMUSCULAR | Status: DC
  Administered 2022-01-05: 4 [IU] via SUBCUTANEOUS
  Filled 2022-01-05: qty 1

## 2022-01-05 MED ORDER — SODIUM CHLORIDE 0.9 % IV SOLN
2.0000 g | Freq: Once | INTRAVENOUS | Status: AC
  Administered 2022-01-05: 2 g via INTRAVENOUS
  Filled 2022-01-05: qty 20

## 2022-01-05 MED ORDER — APIXABAN 5 MG PO TABS
5.0000 mg | ORAL_TABLET | Freq: Two times a day (BID) | ORAL | Status: DC
  Administered 2022-01-05 – 2022-01-07 (×5): 5 mg via ORAL
  Filled 2022-01-05 (×5): qty 1

## 2022-01-05 MED ORDER — MEMANTINE HCL 5 MG PO TABS
10.0000 mg | ORAL_TABLET | Freq: Two times a day (BID) | ORAL | Status: DC
  Administered 2022-01-05 – 2022-01-07 (×4): 10 mg via ORAL
  Filled 2022-01-05 (×4): qty 2

## 2022-01-05 MED ORDER — SODIUM CHLORIDE 0.9 % IV BOLUS
1000.0000 mL | Freq: Once | INTRAVENOUS | Status: AC
  Administered 2022-01-05: 1000 mL via INTRAVENOUS

## 2022-01-05 MED ORDER — IOHEXOL 300 MG/ML  SOLN
80.0000 mL | Freq: Once | INTRAMUSCULAR | Status: AC | PRN
  Administered 2022-01-05: 80 mL via INTRAVENOUS

## 2022-01-05 NOTE — Plan of Care (Signed)

## 2022-01-05 NOTE — Assessment & Plan Note (Signed)
Borderline sepsis criteria with heart rate in the 90s, temperature 99.6, leukocytosis source of infection Sepsis fluids and antibiotics to treat pyelonephritis as outlined

## 2022-01-05 NOTE — Assessment & Plan Note (Signed)
IV hydration and monitor 

## 2022-01-05 NOTE — Progress Notes (Signed)
PHARMACY - PHYSICIAN COMMUNICATION CRITICAL VALUE ALERT - BLOOD CULTURE IDENTIFICATION (BCID)  [name] is an 66 y.o. male who presented to [facility] on 01/04/2022 with a chief complaint of fever, nausea and increase confusion  Assessment:  3/4 bottles GNR. E. Coli with no resistance detected. Possible urinary source.   Name of physician (or Provider) Contacted: Dr. Dorcas Carrow  Current antibiotics: Cefepime 2 gram Q12  Changes to prescribed antibiotics recommended: De-escalate to Ceftriaxone 2 gram Q24H Recommendations accepted by provider  Results for orders placed or performed during the hospital encounter of 01/04/22  Blood Culture ID Panel (Reflexed) (Collected: 01/04/2022 10:56 PM)  Result Value Ref Range   Enterococcus faecalis NOT DETECTED NOT DETECTED   Enterococcus Faecium NOT DETECTED NOT DETECTED   Listeria monocytogenes NOT DETECTED NOT DETECTED   Staphylococcus species NOT DETECTED NOT DETECTED   Staphylococcus aureus (BCID) NOT DETECTED NOT DETECTED   Staphylococcus epidermidis NOT DETECTED NOT DETECTED   Staphylococcus lugdunensis NOT DETECTED NOT DETECTED   Streptococcus species NOT DETECTED NOT DETECTED   Streptococcus agalactiae NOT DETECTED NOT DETECTED   Streptococcus pneumoniae NOT DETECTED NOT DETECTED   Streptococcus pyogenes NOT DETECTED NOT DETECTED   A.calcoaceticus-baumannii NOT DETECTED NOT DETECTED   Bacteroides fragilis NOT DETECTED NOT DETECTED   Enterobacterales DETECTED (A) NOT DETECTED   Enterobacter cloacae complex NOT DETECTED NOT DETECTED   Escherichia coli DETECTED (A) NOT DETECTED   Klebsiella aerogenes NOT DETECTED NOT DETECTED   Klebsiella oxytoca NOT DETECTED NOT DETECTED   Klebsiella pneumoniae NOT DETECTED NOT DETECTED   Proteus species NOT DETECTED NOT DETECTED   Salmonella species NOT DETECTED NOT DETECTED   Serratia marcescens NOT DETECTED NOT DETECTED   Haemophilus influenzae NOT DETECTED NOT DETECTED   Neisseria  meningitidis NOT DETECTED NOT DETECTED   Pseudomonas aeruginosa NOT DETECTED NOT DETECTED   Stenotrophomonas maltophilia NOT DETECTED NOT DETECTED   Candida albicans NOT DETECTED NOT DETECTED   Candida auris NOT DETECTED NOT DETECTED   Candida glabrata NOT DETECTED NOT DETECTED   Candida krusei NOT DETECTED NOT DETECTED   Candida parapsilosis NOT DETECTED NOT DETECTED   Candida tropicalis NOT DETECTED NOT DETECTED   Cryptococcus neoformans/gattii NOT DETECTED NOT DETECTED   CTX-M ESBL NOT DETECTED NOT DETECTED   Carbapenem resistance IMP NOT DETECTED NOT DETECTED   Carbapenem resistance KPC NOT DETECTED NOT DETECTED   Carbapenem resistance NDM NOT DETECTED NOT DETECTED   Carbapenem resist OXA 48 LIKE NOT DETECTED NOT DETECTED   Carbapenem resistance VIM NOT DETECTED NOT DETECTED   Sharen Hones, PharmD, BCPS Clinical Pharmacist   01/05/2022  11:55 AM

## 2022-01-05 NOTE — Assessment & Plan Note (Signed)
See above. 

## 2022-01-05 NOTE — Progress Notes (Signed)
Patient seen and examined.  66 year old gentleman with chronic indwelling Foley catheter, sacral decubitus ulcer and mobility issues, diverting colostomy brought from home with hallucinations and found to have sepsis secondary to UTI.  Blood cultures positive for E. coli.  Clinically stabilizing.  Stop cefepime.  Started on Rocephin 2 g daily.  Catheter was exchanged.  Allow feeding. Decubitus ulcer was examined and pictures taken.  Stage IV tunneling wound but clean.  Wound care instructions.  Updated patient's wife on the phone.   Same-day admit.  No charge visit.

## 2022-01-05 NOTE — H&P (Signed)
History and Physical    [name] JIR:[id number] DOB: 03-Dec-1955 DOA: 01/04/2022 DOS: the patient was seen and examined on 01/05/2022 PCP: Dione Booze, MD  Patient coming from: Home  Chief Complaint:  Chief Complaint  Patient presents with   Altered Mental Status    HPI: [name] is a 66 y.o. 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  Patient was noted to have repetitive questioning and talking somewhat outside his head earlier in the day thus prompting the visit to the ED. ED course and data review: On arrival temperature 99.6 and other vitals initially within normal limits however BP trended down to as low as 89/57 but responded to fluid bolus and is up to 127/78 at the time of admission Labs significant for WBC of 11,000 with lactic acid of 1.4.  Creatinine at baseline at 1.93 with potassium 5.2, sodium 127.  Hemoglobin at baseline at 8.3. Chest x-ray with no active disease CT abdomen and pelvis showing the following findings: IMPRESSION: 1. Moderate right greater than left perinephric edema with prominence of the right renal pelvis and slight ureteral hyperemia. Findings may be due to urinary tract infection. Recommend correlation with urinalysis. 2. Large sacral decubitus ulcer extends to bone with subjacent erosive change and truncation of the coccyx, suspicious for chronic osteomyelitis. No significant change from April. 3. Right hip joint effusion, slightly increased in size from prior exam. 4. Prominent bilateral inguinal nodes, right greater than left, likely reactive.    Patient started on IV fluids given a dose of Rocephin and hospitalist consulted for admission.    Past Medical History:  Diagnosis Date   Bipolar disorder (HCC)    Diabetes mellitus without complication (HCC)    History of blood clots    Hypertension    TBI (traumatic brain injury) (HCC)    Past Surgical History:  Procedure Laterality Date   BACK SURGERY     CARPAL TUNNEL RELEASE Bilateral    COLON SURGERY     COLOSTOMY     IR PERC TUN PERIT CATH WO PORT S&I /IMAG  10/11/2021   IR REMOVAL TUN CV CATH W/O FL  11/19/2021   IR US GUIDE VASC ACCESS RIGHT  10/11/2021   TONSILLECTOMY     Social History:  reports that he has never smoked. He has never been exposed to tobacco smoke. He has never used smokeless tobacco. He reports current alcohol use. He reports that he does not use drugs.  No Known Allergies  Family History  Problem Relation Age of Onset   Depression Father    Deep vein thrombosis Father     Prior to Admission medications   Medication Sig Start Date End Date Taking? Authorizing Provider  amLODipine-olmesartan (AZOR) 5-20 MG tablet Take 1 tablet by mouth daily. 11/14/21   [provider]  ascorbic acid (VITAMIN C) 500 MG tablet Take 1 tablet (500 mg total) by mouth daily. 07/31/21   Enedina Finner, MD  cholecalciferol (VITAMIN D) 25 MCG tablet Take 1 tablet (1,000 Units total) by mouth daily. 07/31/21   Enedina Finner, MD  ELIQUIS 5 MG TABS tablet Take 5 mg by mouth 2 (two) times daily.  11/17/18  [provider]  fish oil-omega-3 fatty acids 1000 MG capsule SMARTSIG:1 Capsule(s) By Mouth Daily 10/12/21   [provider]  glipiZIDE (GLUCOTROL XL) 2.5 MG 24 hr tablet Take 1 tablet (2.5 mg total) by mouth daily with breakfast. 06/25/21   Loletha Grayer, MD  memantine (NAMENDA) 10 MG tablet Take 10 mg by mouth 2 (two) times daily.    [provider]  Oxcarbazepine (TRILEPTAL) 300 MG tablet Take 1 tablet (300 mg total) by mouth 2 (two) times  daily. 12/24/21   Ursula Alert, MD  QUEtiapine (SEROQUEL) 100 MG tablet Take 1 tablet (100 mg total) by mouth at bedtime. 12/24/21   Ursula Alert, MD  QUEtiapine (SEROQUEL) 50 MG tablet TAKE 1/2 TO 1 (ONE-HALF TO ONE) TABLET BY MOUTH ONCE DAILY AS NEEDED FOR  AGITATION 12/24/21   Ursula Alert, MD  rosuvastatin (CRESTOR) 20 MG tablet Take 20 mg by mouth daily.  08/29/17   [provider]  vitamin B-12 (CYANOCOBALAMIN) 1000 MCG tablet Take 3,000 mcg by mouth daily.    [provider]  zinc sulfate 220 (50 Zn) MG capsule Take 1 capsule (220 mg total) by mouth daily. 07/31/21   Fritzi Mandes, MD    Physical Exam: Vitals:   01/05/22 0030 01/05/22 0100 01/05/22 0130 01/05/22 0205  BP: 94/60 (!) 88/59 (!) 89/57 127/78  Pulse: 77 73 66 70  Resp: 16 15 12 13   Temp:      TempSrc:      SpO2: 99% 99% 99% 100%  Weight:      Height:       Physical Exam Vitals and nursing note reviewed.  Constitutional:      General: He is not in acute distress.    Appearance: He is ill-appearing.  HENT:     Head: Normocephalic and atraumatic.  Cardiovascular:     Rate and Rhythm: Normal rate and regular rhythm.     Heart sounds: Normal heart sounds.  Pulmonary:     Effort: Pulmonary effort is normal.     Breath sounds: Normal breath sounds.  Abdominal:     Palpations: Abdomen is soft.     Tenderness: There is no abdominal tenderness.  Neurological:     Mental Status: He is lethargic.     Labs on Admission: I have personally reviewed following labs and imaging studies  CBC: Recent Labs  Lab 01/04/22 2255  WBC 11.2*  NEUTROABS 8.6*  HGB 8.3*  HCT 27.0*  MCV 86.3  PLT [id number]   Basic Metabolic Panel: Recent Labs  Lab 01/04/22 2255  NA 127*  K 5.2*  CL 101  CO2 20*  GLUCOSE 194*  BUN 27*  CREATININE 1.93*  CALCIUM 8.1*   GFR: Estimated Creatinine Clearance: 46.9 mL/min (A) (by C-G formula based on SCr of 1.93 mg/dL (H)). Liver Function Tests: Recent Labs  Lab  01/04/22 2255  AST 20  ALT 14  ALKPHOS 99  BILITOT 1.2  PROT 8.5*  ALBUMIN 2.8*   No results for input(s): "LIPASE", "AMYLASE" in the last 168 hours. Recent Labs  Lab 01/04/22 2256  AMMONIA 19   Coagulation Profile: Recent Labs  Lab 01/04/22 2255  INR 1.5*   Cardiac Enzymes: No results for input(s): "CKTOTAL", "CKMB", "CKMBINDEX", "TROPONINI" in the last 168 hours. BNP (last 3 results) No results for input(s): "PROBNP" in the last 8760 hours. HbA1C: No results for input(s): "HGBA1C" in the last 72 hours. CBG: No results for input(s): "GLUCAP" in the last 168 hours. Lipid Profile:  No results for input(s): "CHOL", "HDL", "LDLCALC", "TRIG", "CHOLHDL", "LDLDIRECT" in the last 72 hours. Thyroid Function Tests: No results for input(s): "TSH", "T4TOTAL", "FREET4", "T3FREE", "THYROIDAB" in the last 72 hours. Anemia Panel: No results for input(s): "VITAMINB12", "FOLATE", "FERRITIN", "TIBC", "IRON", "RETICCTPCT" in the last 72 hours. Urine analysis:    Component Value Date/Time   COLORURINE YELLOW (A) 01/04/2022 2256   APPEARANCEUR CLOUDY (A) 01/04/2022 2256   APPEARANCEUR Cloudy (A) 08/09/2021 1559   LABSPEC 1.012 01/04/2022 2256   PHURINE 6.0 01/04/2022 2256   GLUCOSEU NEGATIVE 01/04/2022 2256   HGBUR LARGE (A) 01/04/2022 2256   BILIRUBINUR NEGATIVE 01/04/2022 2256   BILIRUBINUR Negative 08/09/2021 1559   KETONESUR NEGATIVE 01/04/2022 2256   PROTEINUR 100 (A) 01/04/2022 2256   NITRITE NEGATIVE 01/04/2022 2256   LEUKOCYTESUR LARGE (A) 01/04/2022 2256    Radiological Exams on Admission: CT ABDOMEN PELVIS W CONTRAST  Result Date: 01/05/2022 CLINICAL DATA:  Sepsis.  Confusion.  Fever. EXAM: CT ABDOMEN AND PELVIS WITH CONTRAST TECHNIQUE: Multidetector CT imaging of the abdomen and pelvis was performed using the standard protocol following bolus administration of intravenous contrast. RADIATION DOSE REDUCTION: This exam was performed according to the departmental  dose-optimization program which includes automated exposure control, adjustment of the mA and/or kV according to patient size and/or use of iterative reconstruction technique. CONTRAST:  74mL OMNIPAQUE IOHEXOL 300 MG/ML  SOLN COMPARISON:  CT 10/06/2021 FINDINGS: Lower chest: No confluent airspace disease or pleural effusion. There are coronary artery calcifications. Hepatobiliary: Tiny subcapsular hypodensity in the right hepatic lobe is unchanged, too small to characterize but likely cyst. Gallbladder physiologically distended, no calcified stone. No biliary dilatation. Pancreas: No ductal dilatation or inflammation. Spleen: Normal in size without focal abnormality. Adrenals/Urinary Tract: No adrenal nodule. Moderate right greater than left perinephric edema. Mild prominence of the right renal pelvis and slight ureteral hyperemia. Stranding adjacent to the right ureter. No renal calculi or focal lesion. Foley catheter in the bladder, mild bladder wall thickening. Stomach/Bowel: Left-sided loop no bowel obstruction or inflammation. Normal appendix visualized. Redundant sigmoid colon. Colostomy. Vascular/Lymphatic: Aortic and branch atherosclerosis. No aortic aneurysm. Prominent retroperitoneal lymph nodes, none of which are enlarged by size criteria. There prominent bilateral inguinal nodes, right greater than left. Calcification within the left common femoral vein. Reproductive: Prostate is unremarkable. Other: No ascites or free air. Small fat containing umbilical hernia. Musculoskeletal: Large sacral decubitus ulcer extends to bone with subjacent erosive change and truncation of the coccyx. There is a right hip joint effusion, increased in size from prior exam. Advanced bilateral hip osteoarthritis. Diffuse degenerative change in the lumbar spine. Paraspinal muscle fatty atrophy. IMPRESSION: 1. Moderate right greater than left perinephric edema with prominence of the right renal pelvis and slight ureteral  hyperemia. Findings may be due to urinary tract infection. Recommend correlation with urinalysis. 2. Large sacral decubitus ulcer extends to bone with subjacent erosive change and truncation of the coccyx, suspicious for chronic osteomyelitis. No significant change from April. 3. Right hip joint effusion, slightly increased in size from prior exam. 4. Prominent bilateral inguinal nodes, right greater than left, likely reactive. Aortic Atherosclerosis (ICD10-I70.0). Electronically Signed   By: Narda Rutherford M.D.   On: 01/05/2022 02:13   CT HEAD WO CONTRAST ( )  Result Date: 01/05/2022 CLINICAL DATA:  Mental status change, unknown cause EXAM: CT HEAD WITHOUT CONTRAST TECHNIQUE: Contiguous axial images were obtained from the base of the skull through the vertex without intravenous contrast. RADIATION DOSE REDUCTION: This exam was performed according  to the departmental dose-optimization program which includes automated exposure control, adjustment of the mA and/or kV according to patient size and/or use of iterative reconstruction technique. COMPARISON:  Head CT 12/16/2021 FINDINGS: Brain: No intracranial hemorrhage, mass effect, or midline shift. Stable degree of atrophy and chronic small vessel ischemia. No hydrocephalus. The basilar cisterns are patent. No evidence of territorial infarct or acute ischemia. No extra-axial or intracranial fluid collection. Vascular: Atherosclerosis of skullbase vasculature without hyperdense vessel or abnormal calcification. Skull: No fracture or focal lesion. Sinuses/Orbits: Paranasal sinuses and mastoid air cells are clear. The visualized orbits are unremarkable. Other: None. IMPRESSION: 1. No acute intracranial abnormality. 2. Stable atrophy and chronic small vessel ischemia. Electronically Signed   By: Keith Rake M.D.   On: 01/05/2022 02:03   DG Chest Port 1 View  Result Date: 01/04/2022 CLINICAL DATA:  Fevers EXAM: PORTABLE CHEST 1 VIEW COMPARISON:  12/16/2021  FINDINGS: Cardiac shadow is within normal limits. Lungs are well aerated bilaterally. No focal infiltrate or effusion is seen. No bony abnormality is noted. IMPRESSION: No active disease. Electronically Signed   By: Inez Catalina M.D.   On: 01/04/2022 23:16     Data Reviewed: Relevant notes from primary care and specialist visits, past discharge summaries as available in EHR, including Care Everywhere. Prior diagnostic testing as pertinent to current admission diagnoses Updated medications and problem lists for reconciliation ED course, including vitals, labs, imaging, treatment and response to treatment Triage notes, nursing and pharmacy notes and ED provider's notes Notable results as noted in HPI   Assessment and Plan: * Sepsis ([place]) Borderline sepsis criteria with heart rate in the 90s, temperature 99.6, leukocytosis source of infection Sepsis fluids and antibiotics to treat pyelonephritis as outlined  Acute pyelonephritis Chronic indwelling Foley catheter CT abdomen showing right perinephric stranding Foley catheter was exchanged in the ED IV cefepime and follow cultures    Colostomy in place [facility]) Colostomy care  Anemia of chronic disease Hemoglobin at baseline  Hyponatremia IV hydration and monitor  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) Complicating factor to overall prognosis and care  Decubitus ulcers History of sacral and heel ulcers Wound care consult for decubitus care and recommendations Decubitus precautions   Acute metabolic encephalopathy Secondary to acute infection We will keep n.p.o. tonight Neurochecks, aspiration precautions  Type II diabetes  mellitus with renal manifestations ([place]) Sliding scale insulin coverage  Hypertension Blood pressure stable so we will continue home amlodipine  Chronic anticoagulation - on Eliquis On Eliquis for history of recurrent DVT  we will continue        DVT prophylaxis: Eliquis  Consults: none  Advance Care Planning:   Code Status: Prior   Family Communication: none  Disposition Plan: Back to previous home environment  Severity of Illness: The appropriate patient status for this patient is INPATIENT. Inpatient status is judged to be reasonable and necessary in order to provide the required intensity of service to ensure the patient's safety. The patient's presenting symptoms, physical exam findings, and initial radiographic and laboratory data in the context of their chronic comorbidities is felt to place them at high risk for further clinical deterioration. Furthermore, it is not anticipated that the patient will be medically stable for discharge from the hospital within 2  midnights of admission.   * I certify that at the point of admission it is my clinical judgment that the patient will require inpatient hospital care spanning beyond 2 midnights from the point of admission due to high intensity of service, high risk for further deterioration and high frequency of surveillance required.*  Author: Athena Masse, MD 01/05/2022 3:38 AM  For on call review [email or web address].

## 2022-01-05 NOTE — Assessment & Plan Note (Signed)
Blood pressure stable so we will continue home amlodipine

## 2022-01-05 NOTE — Progress Notes (Signed)
Pt admitted to unit. States he is currently in no pain, Pt provided reassurance on the unit. Telemetry ordered, but telemetry cords not available: charge RN made aware of issue. Pending delivery of cord.

## 2022-01-05 NOTE — Consult Note (Signed)
WOC Nurse Consult Note: Reason for Consult:Chronic, nonhealing pressure injuries to sacrum and bilateral heels. Photo of sacral wound provided by admitting physician Wound type:Pressure Pressure Injury POA: Yes Measurement:To be obtained by Bedside RN today and documented on Nursing Flow Sheet with next dressing change Drainage (amount, consistency, odor) small serous Periwound:with evidence of previous wound healing (scarring) Dressing procedure/placement/frequency:I have provided a mattress replacement with low air loss feature and turning and repositioning is in place. Bilateral heel boots are ordered. Topical care to the heels will be with xeroform gauze applied after daily cleansing and topped with dry gauze and secured with Kerlix roll gauze/paper tape.  The sacral wound is to be cleansed with sodium hypochlorite (Dakin's) solution and filled with sodium hypochlorite solution moistened gauze. This will be topped with dry gauze and an ABD pad and secured with paper tape.   WOC Nurse ostomy consult note Stoma type/location: LLQ colostomy since January 2021 Stomal assessment/size: Not measured today Peristomal assessment: Not seen today Treatment options for stomal/peristomal skin: Skin barrier ring Output: soft stool Ostomy pouching: 2pc. 2 and 3/4 inch pouching system with skin barrier ring Enrolled patient in [employer] DC program: No. Patient is established with a provider.  WOC nursing team will not follow, but will remain available to this patient, the nursing and medical teams.  Please re-consult if needed.  Thank you for inviting [place] to participate in this patient's Plan of Care.  Ladona Mow, MSN, RN, CNS, GNP, Leda Min, [employer], [employer] phone:  [phone]

## 2022-01-05 NOTE — Assessment & Plan Note (Signed)
Secondary to acute infection We will keep n.p.o. tonight Neurochecks, aspiration precautions

## 2022-01-05 NOTE — Assessment & Plan Note (Addendum)
On Eliquis for history of recurrent DVT  we will continue

## 2022-01-05 NOTE — Progress Notes (Signed)
Pharmacy Antibiotic Note  [name] is a 66 y.o. male admitted on 01/04/2022 with sepsis.  Pharmacy has been consulted for Cefepime  dosing.  Plan: Cefepime 2 gm IV Q12H ordered to start on 7/8 @ 0600.   Height: 5\' 6"  (167.6 cm) Weight: 124.2 kg (273 lb 14.4 oz) IBW/kg (Calculated) : 63.8  Temp (24hrs), Avg:99.6 F (37.6 C), Min:99.6 F (37.6 C), Max:99.6 F (37.6 C)  Recent Labs  Lab 01/04/22 2255  WBC 11.2*  CREATININE 1.93*  LATICACIDVEN 1.4    Estimated Creatinine Clearance: 46.9 mL/min (A) (by C-G formula based on SCr of 1.93 mg/dL (H)).    No Known Allergies  Antimicrobials this admission:   >>    >>   Dose adjustments this admission:   Microbiology results:  BCx:   UCx:    Sputum:    MRSA PCR:   Thank you for allowing pharmacy to be a part of this patient's care.  Mozelle Remlinger D 01/05/2022 3:58 AM

## 2022-01-05 NOTE — Assessment & Plan Note (Signed)
Colostomy care 

## 2022-01-05 NOTE — Assessment & Plan Note (Signed)
Complicating factor to overall prognosis and care 

## 2022-01-05 NOTE — Assessment & Plan Note (Signed)
Chronic indwelling Foley catheter CT abdomen showing right perinephric stranding Foley catheter was exchanged in the ED IV cefepime and follow cultures

## 2022-01-05 NOTE — Assessment & Plan Note (Signed)
Renal function at baseline 

## 2022-01-05 NOTE — Assessment & Plan Note (Signed)
Sliding scale insulin coverage 

## 2022-01-05 NOTE — Assessment & Plan Note (Signed)
History of sacral and heel ulcers Wound care consult for decubitus care and recommendations Decubitus precautions

## 2022-01-05 NOTE — Assessment & Plan Note (Signed)
Increased nursing assistance for communication of needs Continue memantine, quetiapine and Trileptal

## 2022-01-06 DIAGNOSIS — G9341 Metabolic encephalopathy: Secondary | ICD-10-CM | POA: Diagnosis not present

## 2022-01-06 DIAGNOSIS — A419 Sepsis, unspecified organism: Secondary | ICD-10-CM | POA: Diagnosis not present

## 2022-01-06 DIAGNOSIS — N179 Acute kidney failure, unspecified: Secondary | ICD-10-CM | POA: Diagnosis not present

## 2022-01-06 DIAGNOSIS — R6520 Severe sepsis without septic shock: Secondary | ICD-10-CM | POA: Diagnosis not present

## 2022-01-06 LAB — PHOSPHORUS: Phosphorus: 3.1 mg/dL (ref 2.5–4.6)

## 2022-01-06 LAB — CBC WITH DIFFERENTIAL/PLATELET
Abs Immature Granulocytes: 0.03 10*3/uL (ref 0.00–0.07)
Basophils Absolute: 0 10*3/uL (ref 0.0–0.1)
Basophils Relative: 0 %
Eosinophils Absolute: 0.2 10*3/uL (ref 0.0–0.5)
Eosinophils Relative: 2 %
HCT: 28.5 % — ABNORMAL LOW (ref 39.0–52.0)
Hemoglobin: 8.6 g/dL — ABNORMAL LOW (ref 13.0–17.0)
Immature Granulocytes: 0 %
Lymphocytes Relative: 10 %
Lymphs Abs: 0.8 10*3/uL (ref 0.7–4.0)
MCH: 26.5 pg (ref 26.0–34.0)
MCHC: 30.2 g/dL (ref 30.0–36.0)
MCV: 87.7 fL (ref 80.0–100.0)
Monocytes Absolute: 1.1 10*3/uL — ABNORMAL HIGH (ref 0.1–1.0)
Monocytes Relative: 15 %
Neutro Abs: 5.3 10*3/uL (ref 1.7–7.7)
Neutrophils Relative %: 73 %
Platelets: 176 10*3/uL (ref 150–400)
RBC: 3.25 MIL/uL — ABNORMAL LOW (ref 4.22–5.81)
RDW: 17.6 % — ABNORMAL HIGH (ref 11.5–15.5)
WBC: 7.4 10*3/uL (ref 4.0–10.5)
nRBC: 0 % (ref 0.0–0.2)

## 2022-01-06 LAB — COMPREHENSIVE METABOLIC PANEL
ALT: 14 U/L (ref 0–44)
AST: 15 U/L (ref 15–41)
Albumin: 2.7 g/dL — ABNORMAL LOW (ref 3.5–5.0)
Alkaline Phosphatase: 86 U/L (ref 38–126)
Anion gap: 6 (ref 5–15)
BUN: 24 mg/dL — ABNORMAL HIGH (ref 8–23)
CO2: 21 mmol/L — ABNORMAL LOW (ref 22–32)
Calcium: 8.4 mg/dL — ABNORMAL LOW (ref 8.9–10.3)
Chloride: 106 mmol/L (ref 98–111)
Creatinine, Ser: 2.05 mg/dL — ABNORMAL HIGH (ref 0.61–1.24)
GFR, Estimated: 35 mL/min — ABNORMAL LOW (ref >60)
Glucose, Bld: 90 mg/dL (ref 70–99)
Potassium: 4.1 mmol/L (ref 3.5–5.1)
Sodium: 133 mmol/L — ABNORMAL LOW (ref 135–145)
Total Bilirubin: 0.4 mg/dL (ref 0.3–1.2)
Total Protein: 7.9 g/dL (ref 6.5–8.1)

## 2022-01-06 LAB — GLUCOSE, CAPILLARY
Glucose-Capillary: 73 mg/dL (ref 70–99)
Glucose-Capillary: 112 mg/dL — ABNORMAL HIGH (ref 70–99)
Glucose-Capillary: 171 mg/dL — ABNORMAL HIGH (ref 70–99)
Glucose-Capillary: 140 mg/dL — ABNORMAL HIGH (ref 70–99)
Glucose-Capillary: 127 mg/dL — ABNORMAL HIGH (ref 70–99)

## 2022-01-06 LAB — URINE CULTURE: Culture: >=100000 — AB

## 2022-01-06 LAB — LACTIC ACID, PLASMA: Lactic Acid, Venous: 1.4 mmol/L (ref 0.5–1.9)

## 2022-01-06 LAB — MAGNESIUM: Magnesium: 1.9 mg/dL (ref 1.7–2.4)

## 2022-01-06 MED ORDER — ORAL CARE MOUTH RINSE
15.0000 mL | OROMUCOSAL | Status: DC
  Administered 2022-01-06 – 2022-01-07 (×6): 15 mL via OROMUCOSAL

## 2022-01-06 MED ORDER — ORAL CARE MOUTH RINSE: 15.0000 mL | OROMUCOSAL | Status: DC | PRN

## 2022-01-06 MED ORDER — CHLORHEXIDINE GLUCONATE CLOTH 2 % EX PADS
6.0000 | MEDICATED_PAD | Freq: Every day | CUTANEOUS | Status: DC
Start: 2022-01-06 — End: 2022-01-07
  Administered 2022-01-06: 6 via TOPICAL

## 2022-01-06 NOTE — Evaluation (Signed)
Occupational Therapy Evaluation Patient Details Name: [name] MRN: [id number] DOB: 10/21/1955 Today's Date: 01/06/2022   History of Present Illness Pt admitted to [facility] on 01/04/22 for c/o fever, nausea, and confusion. Found to have sepsis secondary to UTI and was started on IV antibiotics. Imaging negative for acute changes. 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   Clinical Impression   Pt presents with generalized weakness, limited endurance, cognitive deficits, and impaired balance. He lives at home with his wife in a single-story home. She provides assistance with most of his ADLs and all IADLs. A wound care specialist comes to his home 1 day/week. Pt reports "a lot" of falls in previous 6 months. At baseline, he uses primarily a WC for mobility. During today's evaluation, pt presents with anxiety regarding movement, stating he is fearful of falling, but with encouragement he participates more actively in session, and he becomes more relaxed when his wife arrives, mid-session. He requires ongoing cueing, encouragement, and Min-Mod A for bed mobility and Max A for standing. He is able to change shirt and hospital gown with Min A while seated. Pt has considerable fxl mobility needs; however, he appears to be near his baseline level of ADL performance, and wife states she feels able to manage his needs. Recommend ongoing OT while hospitalized, with DC home with HHOT, particularly to address safety, strength, balance, and falls preventions.    Recommendations for follow up therapy are one component of a multi-disciplinary discharge planning process, led by the attending physician.  Recommendations may be updated based on patient status, additional  functional criteria and insurance authorization.   Follow Up Recommendations  Home health OT    Assistance Recommended at Discharge    Patient can return home with the following A lot of help with walking and/or transfers;A lot of help with bathing/dressing/bathroom;Assistance with cooking/housework;Direct supervision/assist for medications management;Direct supervision/assist for financial management    Functional Status Assessment     Equipment Recommendations  None recommended by OT    Recommendations for Other Services       Precautions / Restrictions Precautions Precautions: Fall Restrictions Weight Bearing Restrictions: No      Mobility Bed Mobility Overal bed mobility: Needs Assistance Bed Mobility: Supine to Sit, Sit to Supine     Supine to sit: Min assist, Mod assist Sit to supine: Min assist, Mod assist   General bed mobility comments: increased time/effort + ongoing cueing, min-mod physical assist required; pt able to scoot laterally towards [facility]    Transfers Overall transfer level: Needs assistance   Transfers: Sit to/from Stand Sit to Stand: Max assist, From elevated surface           General transfer comment: cueing for hand placement and for anxiety mgmt      Balance Overall balance assessment: Needs assistance Sitting-balance support: Feet supported, Single extremity supported Sitting balance-Leahy Scale: Good     Standing balance support: Bilateral upper extremity supported, Reliant on assistive device for balance Standing balance-Leahy Scale: Fair Standing balance comment: fair static standing balance                           ADL either performed or assessed with clinical judgement   ADL Overall ADL's : Needs assistance/impaired  Upper Body Dressing : Minimal assistance;Sitting;Set up                     General ADL Comments: Max A +2 for mobility with RW     Vision         Perception      Praxis      Pertinent Vitals/Pain Pain Assessment Pain Assessment: No/denies pain     Hand Dominance Right   Extremity/Trunk Assessment Upper Extremity Assessment Upper Extremity Assessment: Overall WFL for tasks assessed   Lower Extremity Assessment Lower Extremity Assessment: Generalized weakness       Communication Communication Communication: Expressive difficulties   Cognition Arousal/Alertness: Awake/alert Behavior During Therapy: WFL for tasks assessed/performed, Flat affect Overall Cognitive Status: History of cognitive impairments - at baseline                                 General Comments: hx TBI with cognitive impairment; A&O x4; impaired processing     General Comments  BLE spasticity    Exercises Other Exercises Other Exercises: Educ re: falls prevention, value of movement, repositioning, anxiety mgmt   Shoulder Instructions      Home Living Family/patient expects to be discharged to:: Private residence Living Arrangements: Spouse/significant other Available Help at Discharge: Family;Available PRN/intermittently;Other (Comment) (wound care RN) Type of Home: House Home Access: Ramped entrance Entrance Stairs-Number of Steps: ramp to garage Entrance Stairs-Rails: None Home Layout: One level     Bathroom Shower/Tub: Producer, television/film/video: Standard Bathroom Accessibility: Yes   Home Equipment: Wheelchair - Engineer, technical sales - power;Hospital bed;Shower seat;Rolling Environmental consultant (2 wheels)          Prior Functioning/Environment Prior Level of Function : Needs assist       Physical Assist : Mobility (physical);ADLs (physical) Mobility (physical): Transfers;Bed mobility ADLs (physical): IADLs;Toileting;Dressing;Bathing Mobility Comments: Spouse assists with bed mobility and SPT to/from WC. Reports needing assist for WC mobility. ADLs Comments: Spouse assists with dressing, bathing, and IADLs        OT Problem  List: Decreased strength;Decreased range of motion;Decreased activity tolerance;Decreased coordination;Impaired balance (sitting and/or standing);Obesity;Increased edema      OT Treatment/Interventions: Self-care/ADL training;Therapeutic exercise;Patient/family education;Balance training;Therapeutic activities;DME and/or AE instruction    OT Goals(Current goals can be found in the care plan section) Acute Rehab OT Goals Patient Stated Goal: to fall less OT Goal Formulation: With patient/family Time For Goal Achievement: 01/20/22 Potential to Achieve Goals: Good ADL Goals Pt Will Perform Upper Body Dressing: sitting;with modified independence;with set-up Pt Will Perform Tub/Shower Transfer: with mod assist;shower seat;Stand pivot transfer Additional ADL Goal #1: Pt will identify/demonstrate 2+ falls prevention techniques  OT Frequency: Min 2X/week    Co-evaluation              AM-PAC OT "6 Clicks" Daily Activity     Outcome Measure Help from another person eating meals?: A Little Help from another person taking care of personal grooming?: A Little Help from another person toileting, which includes using toliet, bedpan, or urinal?: A Lot Help from another person bathing (including washing, rinsing, drying)?: A Lot Help from another person to put on and taking off regular upper body clothing?: A Little Help from another person to put on and taking off regular lower body clothing?: A Lot 6 Click Score: 15   End of Session Equipment Utilized During Treatment: Rolling walker (2 wheels)  Activity Tolerance: Patient tolerated  treatment well Patient left: in bed;with call bell/phone within reach;with family/visitor present;with bed alarm set  OT Visit Diagnosis: Unsteadiness on feet (R26.81);Muscle weakness (generalized) (M62.81);Hemiplegia and hemiparesis;History of falling (Z91.81)                Time: 1533-1601 OT Time Calculation (min): 28 min Charges:  OT General Charges $OT  Visit: 1 Visit OT Evaluation $OT Eval Moderate Complexity: 1 Mod OT Treatments $Self Care/Home Management : 23-37 mins Josiah Lobo, PhD, MS, OTR/L 01/06/22, 5:36 PM

## 2022-01-06 NOTE — Progress Notes (Signed)
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  Lives at home with wife.  In the emergency room lactic acid 1.4-3, urine was grossly abnormal.  Cultures were drawn and patient was admitted to the hospital with IV antibiotics.   Assessment & Plan:   Sepsis present on admission secondary to E. coli UTI and E. coli bacteremia due to indwelling Foley catheter: Acute metabolic infective encephalopathy. Sepsis physiology improving.  Blood pressures are fairly stable today.  Mental status is clearing. Urine culture with multiple species. Blood cultures with E. coli, final identification pending. With clinical improvement, continue Rocephin 2 g daily pending final sensitivities. Catheter exchanged in the ER. CT scan with bilateral perinephric edema.  Multiple pressure ulcers including Stage IV sacral decubitus ulcer Bilateral heel unstageable ulcers. Pressure Injury 01/16/20 Sacrum Medial;Posterior Stage 4 - Full thickness tissue loss with exposed bone, tendon or muscle. Large open with granulated tissue present. (Active)  01/16/20 2030  Location: Sacrum  Location Orientation: Medial;Posterior  Staging: Stage 4 - Full thickness tissue loss with exposed bone, tendon or muscle.  Wound Description (Comments): Large open with granulated tissue present.  Present on Admission: Yes     Pressure Injury 01/16/20 Heel Right;Posterior Stage 3 -  Full thickness tissue loss. Subcutaneous fat may be visible but bone, tendon or muscle are NOT  exposed. (Active)  01/16/20 2030  Location: Heel  Location Orientation: Right;Posterior  Staging: Stage 3 -  Full thickness tissue loss. Subcutaneous fat may be visible but bone, tendon or muscle are NOT exposed.  Wound Description (Comments):   Present on Admission: Yes   Recurrent DVT: On Eliquis.  Chronic issues remained stable on appropriate home medications as below. Neurogenic bladder with indwelling Foley catheter Diverting colostomy Anemia of chronic disease CKD stage IIIb with baseline creatinine 1.8-1.9 Type 2 diabetes on sliding scale insulin Hypertension    DVT prophylaxis:  apixaban (ELIQUIS) tablet 5 mg   Code Status: Full code Family Communication: Wife Disposition Plan: Status is: Inpatient Remains inpatient appropriate because: Bacteremia on IV antibiotics     Consultants:  None  Procedures:  None  Antimicrobials:  Rocephin 7/8---   Subjective: Patient seen and examined.  Poor historian but today denies any complaints.  He tells me that he feels better and more awake.  Objective: Vitals:   01/05/22 1611 01/05/22 2057 01/06/22 0426 01/06/22 0800  BP: (!) 149/82 133/69 117/74 122/75  Pulse: 80 75 75 80  Resp: 16 20 20 18   Temp: 100.3 F (37.9 C) 99.5 F (37.5 C) 98 F (36.7 C) 98.8 F (37.1 C)  TempSrc:   Oral Oral  SpO2: 100% 100% 99% 98%  Weight:      Height:        Intake/Output Summary (Last 24 hours) at 01/06/2022 1210 Last data filed at 01/06/2022 0426 Gross per 24 hour  Intake 1776.21 ml  Output 1600 ml  Net 176.21 ml   Filed Weights   01/04/22 2251  Weight: 124.2 kg    Examination:  General: Frail and debilitated.  Chronically sick looking.  Bedbound. Alert and awake and oriented.  Flat  affect. Cardiovascular: S1-S2 normal.  Regular rate rhythm. Respiratory: Bilateral clear.  No added sounds. Gastrointestinal: Soft.  Distended but nontender.  Left lower quadrant colostomy with loose stool.  Foley catheter with clear  urine. Multiple decubitus ulcer. Sacral decubitus ulcers examined, pictures taken. Ext: Generalized weakness.       Data Reviewed: I have personally reviewed following labs and imaging studies  CBC: Recent Labs  Lab 01/04/22 2255 01/06/22 0559  WBC 11.2* 7.4  NEUTROABS 8.6* 5.3  HGB 8.3* 8.6*  HCT 27.0* 28.5*  MCV 86.3 87.7  PLT 184 176   Basic Metabolic Panel: Recent Labs  Lab 01/04/22 2255 01/06/22 0559  NA 127* 133*  K 5.2* 4.1  CL 101 106  CO2 20* 21*  GLUCOSE 194* 90  BUN 27* 24*  CREATININE 1.93* 2.05*  CALCIUM 8.1* 8.4*  MG  --  1.9  PHOS  --  3.1   GFR: Estimated Creatinine Clearance: 44.1 mL/min (A) (by C-G formula based on SCr of 2.05 mg/dL (H)). Liver Function Tests: Recent Labs  Lab 01/04/22 2255 01/06/22 0559  AST 20 15  ALT 14 14  ALKPHOS 99 86  BILITOT 1.2 0.4  PROT 8.5* 7.9  ALBUMIN 2.8* 2.7*   No results for input(s): "LIPASE", "AMYLASE" in the last 168 hours. Recent Labs  Lab 01/04/22 2256  AMMONIA 19   Coagulation Profile: Recent Labs  Lab 01/04/22 2255  INR 1.5*   Cardiac Enzymes: No results for input(s): "CKTOTAL", "CKMB", "CKMBINDEX", "TROPONINI" in the last 168 hours. BNP (last 3 results) No results for input(s): "PROBNP" in the last 8760 hours. HbA1C: Recent Labs    01/05/22 0554  HGBA1C 6.8*   CBG: Recent Labs  Lab 01/05/22 1201 01/05/22 1709 01/05/22 2031 01/06/22 0844 01/06/22 1208  GLUCAP 84 159* 151* 73 112*   Lipid Profile: No results for input(s): "CHOL", "HDL", "LDLCALC", "TRIG", "CHOLHDL", "LDLDIRECT" in the last 72 hours. Thyroid Function Tests: No results for input(s): "TSH", "T4TOTAL", "FREET4", "T3FREE", "THYROIDAB" in the last 72 hours. Anemia Panel: No results for input(s): "VITAMINB12", "FOLATE", "FERRITIN", "TIBC", "IRON", "RETICCTPCT" in the last 72 hours. Sepsis Labs: Recent Labs  Lab 01/04/22 2255 01/05/22 0554 01/06/22 0559  PROCALCITON 1.02  --   --   LATICACIDVEN 1.4 3.3*  1.4    Recent Results (from the past 240 hour(s))  Blood Culture (routine x 2)     Status: None (Preliminary result)   Collection Time: 01/04/22 10:56 PM   Specimen: BLOOD  Result Value Ref Range Status   Specimen Description   Final    BLOOD RIGHT ANTECUBITAL Performed at [facility], [address] [place], [place] [address]    Special Requests   Final    BOTTLES DRAWN AEROBIC AND ANAEROBIC Blood Culture adequate volume Performed at [facility], [address], [place], [place] [address]    Culture  Setup Time   Final    GRAM NEGATIVE RODS AEROBIC BOTTLE ONLY CRITICAL RESULT CALLED TO, READ BACK BY AND VERIFIED WITH: CARISSA [facility] ON 01/05/22 AT 1120 QSD Performed at [facility], [address], [place], [place] [address]    Culture   Final    Romie Minus NEGATIVE RODS IDENTIFICATION TO FOLLOW Performed at [facility], [address], [place], [place] [address]    Report Status PENDING  Incomplete  Blood Culture (routine x 2)     Status: Abnormal (Preliminary result)   Collection Time: 01/04/22 10:56 PM   Specimen: BLOOD  Result Value Ref Range Status   Specimen Description   Final    BLOOD BLOOD LEFT FOREARM Performed at [facility], [address] [place], [place] [address]    Special Requests   Final    BOTTLES DRAWN AEROBIC AND ANAEROBIC Blood Culture adequate volume Performed at [facility], [address] [place], [place] [address]    Culture  Setup Time   Final    Organism ID to follow GRAM NEGATIVE RODS IN BOTH AEROBIC AND ANAEROBIC BOTTLES CRITICAL RESULT CALLED TO, READ BACK BY AND VERIFIED WITH: CARISSA [facility] ON 01/05/22 AT 1120 QSD Performed at [facility], [address], [place], [place] [address]    Culture (A)  Final    ESCHERICHIA COLI SUSCEPTIBILITIES TO FOLLOW Performed at [facility], [address] [place], [place] [address]    Report Status PENDING  Incomplete  Urine  Culture     Status: Abnormal   Collection Time: 01/04/22 10:56 PM   Specimen: In/Out Cath Urine  Result Value Ref Range Status   Specimen Description   Final    IN/OUT CATH URINE Performed at [facility], [address] [place], [place] [address]    Special Requests   Final    NONE Performed at [facility], [address] [place], [place] [address]    Culture (A)  Final    >=100,000 COLONIES/mL MULTIPLE SPECIES PRESENT, SUGGEST RECOLLECTION   Report Status 01/06/2022 FINAL  Final  SARS Coronavirus 2 by RT PCR (hospital order, performed in [facility] hospital lab) *cepheid single result test* Anterior Nasal Swab     Status: None   Collection Time: 01/04/22 10:56 PM   Specimen: Anterior Nasal Swab  Result Value Ref Range Status   SARS Coronavirus 2 by RT PCR NEGATIVE NEGATIVE Final    Comment: (NOTE) SARS-CoV-2 target nucleic acids are NOT DETECTED.  The SARS-CoV-2 RNA is generally detectable in upper and lower respiratory specimens during the acute phase of infection. The lowest concentration of SARS-CoV-2 viral copies this assay can detect is 250 copies / mL. A negative result does not preclude SARS-CoV-2 infection and should not be used as the sole basis for treatment or other patient management decisions.  A negative result may occur with improper specimen collection / handling, submission of specimen other than nasopharyngeal swab, presence of viral mutation(s) within the areas targeted by this assay, and inadequate number of viral copies (<250 copies / mL). A negative result must be combined with clinical observations, patient history, and epidemiological information.  Fact Sheet for Patients:   https://www.patel.info/  Fact Sheet for Healthcare Providers: https://hall.com/  This test is not yet approved or  cleared by the [place] FDA and has been authorized for detection and/or diagnosis of  SARS-CoV-2 by FDA under an Emergency Use Authorization (EUA).  This EUA will remain in effect (meaning this test can be used) for the duration of the COVID-19 declaration under Section 564(b)(1) of the Act, 21 U.S.C. section 360bbb-3(b)(1), unless the authorization is terminated or revoked sooner.  Performed at [facility], [address] [place], [place] [address]   Blood Culture ID Panel (Reflexed)     Status: Abnormal   Collection Time: 01/04/22 10:56 PM  Result Value Ref Range Status   Enterococcus faecalis NOT DETECTED NOT DETECTED Final   Enterococcus Faecium NOT DETECTED NOT DETECTED Final   Listeria monocytogenes NOT DETECTED NOT DETECTED Final   Staphylococcus species NOT DETECTED NOT  DETECTED Final   Staphylococcus aureus (BCID) NOT DETECTED NOT DETECTED Final   Staphylococcus epidermidis NOT DETECTED NOT DETECTED Final   Staphylococcus lugdunensis NOT DETECTED NOT DETECTED Final   Streptococcus species NOT DETECTED NOT DETECTED Final   Streptococcus agalactiae NOT DETECTED NOT DETECTED Final   Streptococcus pneumoniae NOT DETECTED NOT DETECTED Final   Streptococcus pyogenes NOT DETECTED NOT DETECTED Final   A.calcoaceticus-baumannii NOT DETECTED NOT DETECTED Final   Bacteroides fragilis NOT DETECTED NOT DETECTED Final   Enterobacterales DETECTED (A) NOT DETECTED Final    Comment: Enterobacterales represent a large order of gram negative bacteria, not a single organism. CRITICAL RESULT CALLED TO, READ BACK BY AND VERIFIED WITH: CARISSA DOLAN ON 01/05/22 AT 1120 QSD    Enterobacter cloacae complex NOT DETECTED NOT DETECTED Final   Escherichia coli DETECTED (A) NOT DETECTED Final    Comment: CRITICAL RESULT CALLED TO, READ BACK BY AND VERIFIED WITH: CARISSA DOLAN ON 01/05/22 AT 1120 QSD    Klebsiella aerogenes NOT DETECTED NOT DETECTED Final   Klebsiella oxytoca NOT DETECTED NOT DETECTED Final   Klebsiella pneumoniae NOT DETECTED NOT DETECTED Final   Proteus  species NOT DETECTED NOT DETECTED Final   Salmonella species NOT DETECTED NOT DETECTED Final   Serratia marcescens NOT DETECTED NOT DETECTED Final   Haemophilus influenzae NOT DETECTED NOT DETECTED Final   Neisseria meningitidis NOT DETECTED NOT DETECTED Final   Pseudomonas aeruginosa NOT DETECTED NOT DETECTED Final   Stenotrophomonas maltophilia NOT DETECTED NOT DETECTED Final   Candida albicans NOT DETECTED NOT DETECTED Final   Candida auris NOT DETECTED NOT DETECTED Final   Candida glabrata NOT DETECTED NOT DETECTED Final   Candida krusei NOT DETECTED NOT DETECTED Final   Candida parapsilosis NOT DETECTED NOT DETECTED Final   Candida tropicalis NOT DETECTED NOT DETECTED Final   Cryptococcus neoformans/gattii NOT DETECTED NOT DETECTED Final   CTX-M ESBL NOT DETECTED NOT DETECTED Final   Carbapenem resistance IMP NOT DETECTED NOT DETECTED Final   Carbapenem resistance KPC NOT DETECTED NOT DETECTED Final   Carbapenem resistance NDM NOT DETECTED NOT DETECTED Final   Carbapenem resist OXA 48 LIKE NOT DETECTED NOT DETECTED Final   Carbapenem resistance VIM NOT DETECTED NOT DETECTED Final    Comment: Performed at [facility], [address], [place], [place] [address]         Radiology Studies: CT ABDOMEN PELVIS W CONTRAST  Result Date: 01/05/2022 CLINICAL DATA:  Sepsis.  Confusion.  Fever. EXAM: CT ABDOMEN AND PELVIS WITH CONTRAST TECHNIQUE: Multidetector CT imaging of the abdomen and pelvis was performed using the standard protocol following bolus administration of intravenous contrast. RADIATION DOSE REDUCTION: This exam was performed according to the departmental dose-optimization program which includes automated exposure control, adjustment of the mA and/or kV according to patient size and/or use of iterative reconstruction technique. CONTRAST:  50mL OMNIPAQUE IOHEXOL 300 MG/ML  SOLN COMPARISON:  CT 10/06/2021 FINDINGS: Lower chest: No confluent airspace disease or pleural  effusion. There are coronary artery calcifications. Hepatobiliary: Tiny subcapsular hypodensity in the right hepatic lobe is unchanged, too small to characterize but likely cyst. Gallbladder physiologically distended, no calcified stone. No biliary dilatation. Pancreas: No ductal dilatation or inflammation. Spleen: Normal in size without focal abnormality. Adrenals/Urinary Tract: No adrenal nodule. Moderate right greater than left perinephric edema. Mild prominence of the right renal pelvis and slight ureteral hyperemia. Stranding adjacent to the right ureter. No renal calculi or focal lesion. Foley catheter in the bladder, mild bladder wall thickening.  Stomach/Bowel: Left-sided loop no bowel obstruction or inflammation. Normal appendix visualized. Redundant sigmoid colon. Colostomy. Vascular/Lymphatic: Aortic and branch atherosclerosis. No aortic aneurysm. Prominent retroperitoneal lymph nodes, none of which are enlarged by size criteria. There prominent bilateral inguinal nodes, right greater than left. Calcification within the left common femoral vein. Reproductive: Prostate is unremarkable. Other: No ascites or free air. Small fat containing umbilical hernia. Musculoskeletal: Large sacral decubitus ulcer extends to bone with subjacent erosive change and truncation of the coccyx. There is a right hip joint effusion, increased in size from prior exam. Advanced bilateral hip osteoarthritis. Diffuse degenerative change in the lumbar spine. Paraspinal muscle fatty atrophy. IMPRESSION: 1. Moderate right greater than left perinephric edema with prominence of the right renal pelvis and slight ureteral hyperemia. Findings may be due to urinary tract infection. Recommend correlation with urinalysis. 2. Large sacral decubitus ulcer extends to bone with subjacent erosive change and truncation of the coccyx, suspicious for chronic osteomyelitis. No significant change from April. 3. Right hip joint effusion, slightly  increased in size from prior exam. 4. Prominent bilateral inguinal nodes, right greater than left, likely reactive. Aortic Atherosclerosis (ICD10-I70.0). Electronically Signed   By: Keith Rake M.D.   On: 01/05/2022 02:13   CT HEAD WO CONTRAST (5MM)  Result Date: 01/05/2022 CLINICAL DATA:  Mental status change, unknown cause EXAM: CT HEAD WITHOUT CONTRAST TECHNIQUE: Contiguous axial images were obtained from the base of the skull through the vertex without intravenous contrast. RADIATION DOSE REDUCTION: This exam was performed according to the departmental dose-optimization program which includes automated exposure control, adjustment of the mA and/or kV according to patient size and/or use of iterative reconstruction technique. COMPARISON:  Head CT 12/16/2021 FINDINGS: Brain: No intracranial hemorrhage, mass effect, or midline shift. Stable degree of atrophy and chronic small vessel ischemia. No hydrocephalus. The basilar cisterns are patent. No evidence of territorial infarct or acute ischemia. No extra-axial or intracranial fluid collection. Vascular: Atherosclerosis of skullbase vasculature without hyperdense vessel or abnormal calcification. Skull: No fracture or focal lesion. Sinuses/Orbits: Paranasal sinuses and mastoid air cells are clear. The visualized orbits are unremarkable. Other: None. IMPRESSION: 1. No acute intracranial abnormality. 2. Stable atrophy and chronic small vessel ischemia. Electronically Signed   By: Keith Rake M.D.   On: 01/05/2022 02:03   DG Chest Port 1 View  Result Date: 01/04/2022 CLINICAL DATA:  Fevers EXAM: PORTABLE CHEST 1 VIEW COMPARISON:  12/16/2021 FINDINGS: Cardiac shadow is within normal limits. Lungs are well aerated bilaterally. No focal infiltrate or effusion is seen. No bony abnormality is noted. IMPRESSION: No active disease. Electronically Signed   By: Inez Catalina M.D.   On: 01/04/2022 23:16        Scheduled Meds:  apixaban  5 mg Oral BID    Chlorhexidine Gluconate Cloth  6 each Topical Daily   insulin aspart  0-20 Units Subcutaneous TID AC & HS   memantine  10 mg Oral BID   mouth rinse  15 mL Mouth Rinse 4 times per day   QUEtiapine  100 mg Oral QHS   rosuvastatin  20 mg Oral Daily   sodium hypochlorite   Irrigation BID   Continuous Infusions:  cefTRIAXone (ROCEPHIN)  IV Stopped (01/06/22 0140)     LOS: 1 day    Time spent: 35 minutes    Barb Merino, MD [facility] Pager [phone]

## 2022-01-06 NOTE — Plan of Care (Signed)

## 2022-01-06 NOTE — Evaluation (Signed)
Physical Therapy Evaluation Patient Details Name: [name] MRN: [id number] DOB: 06-06-1956 Today's Date: 01/06/2022  History of Present Illness  Pt admitted to [facility] on 01/04/22 for c/o fever, nausea, and confusion. Found to have sepsis secondary to UTI and was started on IV antibiotics. Imaging negative for acute changes. 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   Clinical Impression  Pt is a 66 year old M admitted to hospital on 01/04/22 for sepsis. Pt questionable historian of health due to baseline TBI and cognitive deficit, despite being A&O x4. Attempted calling spouse to confirm PLOF and discuss DC recommendations, however, spouse unavailable. At baseline, pt reports needing assist from spouse/aide for bed mobility, SPT to/from chair, and for WC mobility; spouse and aide to also assist with ADL's, IADL's, medication management, and transportation.   Pt presents with increased BLE tone, BLE weakness, mild edema in extremities, functional weakness, impaired processing, poor safety awareness, decreased activity tolerance, and decreased balance, resulting in impaired functional mobility. Due to deficits, pt required max-total assist for bed mobility and max-total assist for SPT from EOB<>chair. Increased assist and cueing required throughout session for safety and sequencing. Deficits limit the pt's ability to safely and independently perform ADL's, transfer, and ambulate. Pt will benefit from acute skilled PT services to address deficits for return to baseline function.   At this time, PT recommends short-term rehab at DC to address deficits and improve overall safety with functional mobility for decreased caregiver burden. However, if spouse/aide  are able to provide level of care pt currently requires, will recommend DC home with HHPT. Pt agreeable, stating he wants to go home and that his wife can take care of him.        Recommendations for follow up therapy are one component of a multi-disciplinary discharge planning process, led by the attending physician.  Recommendations may be updated based on patient status, additional functional criteria and insurance authorization.  Follow Up Recommendations Skilled nursing-short term rehab (<3 hours/day) Can patient physically be transported by private vehicle: No    Assistance Recommended at Discharge Intermittent Supervision/Assistance  Patient can return home with the following  Two people to help with walking and/or transfers;A lot of help with walking and/or transfers;A lot of help with bathing/dressing/bathroom;Assistance with cooking/housework;Direct supervision/assist for medications management;Assist for transportation;Help with stairs or ramp for entrance    Equipment Recommendations None recommended by PT     Functional Status Assessment Patient has had a recent decline in their functional status and/or demonstrates limited ability to make significant improvements in function in a reasonable and predictable amount of time     Precautions / Restrictions Precautions Precautions: Fall Restrictions Weight Bearing Restrictions: No      Mobility  Bed Mobility Overal bed mobility: Needs Assistance Bed Mobility: Supine to Sit, Sit to Supine     Supine to sit: Max assist, HOB elevated Sit to supine: Total assist   General bed mobility comments: assist for trunk and BLE facilitation; multimodal cues for sequencing and hand placement. Increased time/effort    Transfers Overall transfer level: Needs assistance   Transfers: Bed to chair/wheelchair/BSC       Squat pivot transfers: Max assist, Total assist     General transfer comment: max assist for SPT EOB>chair with  cues for hand placement; increased to  total assist for return to bed    Ambulation/Gait Ambulation/Gait assistance:  (non-ambulatory at baseline)                   Balance Overall balance assessment: Needs assistance Sitting-balance support: Feet supported, Single extremity supported Sitting balance-Leahy Scale: Fair Sitting balance - Comments: intermittent posterior lean without UE support   Standing balance support: During functional activity, Single extremity supported Standing balance-Leahy Scale: Zero Standing balance comment: max-total for SPT                             Pertinent Vitals/Pain Pain Assessment Pain Assessment: No/denies pain    Home Living Family/patient expects to be discharged to:: Private residence Living Arrangements: Spouse/significant other Available Help at Discharge: Family;Available PRN/intermittently;Personal care attendant Type of Home: House Home Access: Stairs to enter;Ramped entrance Entrance Stairs-Rails: None Entrance Stairs-Number of Steps: ramp to garage   Home Layout: One level Home Equipment: Wheelchair - Engineer, technical sales - power;Hospital bed;Shower seat      Prior Function Prior Level of Function : Needs assist       Physical Assist : Mobility (physical);ADLs (physical) Mobility (physical): Transfers;Bed mobility ADLs (physical): IADLs;Toileting;Dressing;Bathing Mobility Comments: Spouse to assist with bed mobility and SPT to/from WC. Reports needing assist for WC mobility. ADLs Comments: Spouse/aide assist with ADL's (bed level), IADL's, and medication management     Hand Dominance   Dominant Hand: Right    Extremity/Trunk Assessment   Upper Extremity Assessment Upper Extremity Assessment: Overall WFL for tasks assessed    Lower Extremity Assessment Lower Extremity Assessment: Generalized weakness (grossly 2/5 with spasticity bil)       Communication   Communication: HOH  Cognition  Arousal/Alertness: Awake/alert Behavior During Therapy: WFL for tasks assessed/performed Overall Cognitive Status: History of cognitive impairments - at baseline                                 General Comments: hx TBI with cognitive impairment; A&O x4; impaired processing        General Comments General comments (skin integrity, edema, etc.): spasticity to BLE; edema to all extremities    Exercises Other Exercises Other Exercises: Participated in bed mobility and SPT x2 with encouragement. Increased assist for mobility. Unknown level of assist at baseline. Other Exercises: Pt educated re: PT role/POC, DC recommendations, safety with mobility, benefits of OOB mobility.   Assessment/Plan    PT Assessment Patient needs continued PT services  PT Problem List Decreased strength;Decreased activity tolerance;Decreased balance;Decreased mobility;Decreased cognition;Decreased safety awareness;Impaired tone       PT Treatment Interventions Gait training;Functional mobility training;Therapeutic activities;Therapeutic exercise;Balance training;Neuromuscular re-education;Cognitive remediation    PT Goals (Current goals can be found in the Care Plan section)  Acute Rehab PT Goals Patient Stated Goal: "go home" PT Goal Formulation: With patient Time For Goal Achievement: 01/20/22 Potential to Achieve Goals: Fair    Frequency Min 2X/week        AM-PAC PT "6 Clicks" Mobility  Outcome Measure Help needed turning from your back to your side while in a flat bed without using bedrails?: A Lot Help needed moving from lying on your back to sitting on the side of a flat bed without using bedrails?: A Lot Help needed moving to and from a bed to a chair (including a wheelchair)?: A Lot Help needed standing up from a chair using your arms (  e.g., wheelchair or bedside chair)?: A Lot Help needed to walk in hospital room?: Total Help needed climbing 3-5 steps with a railing? : Total 6  Click Score: 10    End of Session Equipment Utilized During Treatment: Gait belt Activity Tolerance: Patient tolerated treatment well Patient left: in bed;with call bell/phone within reach;with bed alarm set Nurse Communication: Mobility status PT Visit Diagnosis: Unsteadiness on feet (R26.81);Muscle weakness (generalized) (M62.81);Difficulty in walking, not elsewhere classified (R26.2)    Time: [id number] PT Time Calculation (min) (ACUTE ONLY): 26 min   Charges:   PT Evaluation $PT Eval Low Complexity: 1 Low PT Treatments $Therapeutic Activity: 8-22 mins       Herminio Commons, PT, DPT 3:44 PM,01/06/22

## 2022-01-06 NOTE — Progress Notes (Signed)
Pt refusing bilateral heel boots at this time.

## 2022-01-07 DIAGNOSIS — A419 Sepsis, unspecified organism: Secondary | ICD-10-CM | POA: Diagnosis not present

## 2022-01-07 DIAGNOSIS — R6520 Severe sepsis without septic shock: Secondary | ICD-10-CM | POA: Diagnosis not present

## 2022-01-07 DIAGNOSIS — N179 Acute kidney failure, unspecified: Secondary | ICD-10-CM | POA: Diagnosis not present

## 2022-01-07 LAB — CULTURE, BLOOD (ROUTINE X 2)
Special Requests: ADEQUATE
Special Requests: ADEQUATE

## 2022-01-07 LAB — GLUCOSE, CAPILLARY
Glucose-Capillary: 93 mg/dL (ref 70–99)
Glucose-Capillary: 129 mg/dL — ABNORMAL HIGH (ref 70–99)

## 2022-01-07 MED ORDER — AMOXICILLIN-POT CLAVULANATE 875-125 MG PO TABS
1.0000 | ORAL_TABLET | Freq: Two times a day (BID) | ORAL | 0 refills | Status: DC
Start: 2022-01-07 — End: 2022-01-15

## 2022-01-07 NOTE — Plan of Care (Signed)

## 2022-01-07 NOTE — Progress Notes (Signed)
Pt being discharged home with wife.  She is Legal guardian so AVS packet explained to her.  IVs taken out, and pt taken to medical mall entrance via wheelchair.

## 2022-01-07 NOTE — TOC Initial Note (Signed)
Transition of Care [facility]) - Initial/Assessment Note    Patient Details  Name: Kaimen Peine MRN: [id number] Date of Birth: 1956-06-28  [facility]) CM/SW Contact:    Chapman Fitch, RN Phone Number: 01/07/2022, 10:16 AM  Clinical Narrative:                  Admitted [id number] Admitted from: Home with wife [id number] Pharmacy: [facility] Current home health/prior home health/DME: [facility]uel WC, power WC, hospital bed, shower seat  Patient to discharge today.  Open with [facility].  Resumption order faxed Minerva Areola with [facility] notified.   Wife states she will be transporting patient home after she gets off work at 3 pm.  Bedside RN notified     Expected Discharge Plan: Home w Home Health Services Barriers to Discharge: Barriers Resolved   Patient Goals and CMS Choice        Expected Discharge Plan and Services Expected Discharge Plan: Home w Home Health Services       Living arrangements for the past 2 months: Single Family Home Expected Discharge Date: 01/07/22                                    Prior Living Arrangements/Services Living arrangements for the past 2 months: Single Family Home Lives with:: Spouse Patient language and need for interpreter reviewed:: Yes Do you feel safe going back to the place where you live?: Yes      Need for Family Participation in Patient Care: Yes (Comment) Care giver support system in place?: Yes (comment) Current home services: DME, Home RN Criminal Activity/Legal Involvement Pertinent to Current Situation/Hospitalization: No - Comment as needed  Activities of Daily Living Home Assistive Devices/Equipment: Wheelchair ADL Screening (condition at time of admission) Patient's cognitive ability adequate to safely complete daily activities?: Yes Is the patient deaf or have difficulty hearing?: No Does the patient have difficulty seeing, even when wearing glasses/contacts?: No Does the  patient have difficulty concentrating, remembering, or making decisions?: No Patient able to express need for assistance with ADLs?: Yes Does the patient have difficulty dressing or bathing?: Yes Independently performs ADLs?: No Communication: Needs assistance Is this a change from baseline?: Pre-admission baseline Dressing (OT): Needs assistance Is this a change from baseline?: Pre-admission baseline Grooming: Needs assistance Is this a change from baseline?: Pre-admission baseline Feeding: Independent Bathing: Needs assistance Is this a change from baseline?: Pre-admission baseline Toileting: Needs assistance Is this a change from baseline?: Pre-admission baseline In/Out Bed: Needs assistance Is this a change from baseline?: Pre-admission baseline Walks in Home: Needs assistance Is this a change from baseline?: Pre-admission baseline Does the patient have difficulty walking or climbing stairs?: Yes Weakness of Legs: Both Weakness of Arms/Hands: None  Permission Sought/Granted                  Emotional Assessment         Alcohol / Substance Use: Not Applicable Psych Involvement: No (comment)  Admission diagnosis:  Hyponatremia [E87.1] Encephalopathy [G93.40] Chronic anemia [D64.9] Acute pyelonephritis [N10] Wound of sacral region, subsequent encounter [S31.000D] Fever, unspecified fever cause [R50.9] Altered mental status, unspecified altered mental status type [R41.82] Chronic kidney disease, unspecified CKD stage [N18.9] Patient Active Problem List   Diagnosis Date Noted   Acute pyelonephritis 01/05/2022   Fever 12/16/2021   Hypotension 10/09/2021   Abdominal distension 10/08/2021   AKI (acute kidney injury) (  HCC) 10/08/2021   Sacral wound 10/07/2021   MSSA bacteremia 10/07/2021   Severe sepsis (HCC) 10/06/2021   Sepsis secondary to UTI (HCC) 07/28/2021   COVID-19 virus infection 07/28/2021   Chronic indwelling Foley catheter 06/22/2021   Colostomy in  place [facility]) 06/22/2021   TBI (traumatic brain injury) (HCC)    Healed ulcer of foot on examination 04/14/2021   Acute UTI 02/23/2021   Osteomyelitis of vertebra, sacral and sacrococcygeal region [facility]) 10/18/2020   Hyponatremia 07/07/2020   Encephalopathy 07/07/2020   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) 07/07/2020   Anemia of chronic disease 07/07/2020   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) 06/01/2020   Sacral osteomyelitis (HCC) 04/13/2020   Urinary retention 03/19/2020   History of DVT (deep vein thrombosis) 03/19/2020   Pressure injury of sacral region, stage 4 (HCC) 03/19/2020   Severe sepsis with septic shock (HCC) 03/18/2020   Obesity, Class III, BMI 40-49.9 (morbid obesity) (HCC) 03/18/2020   Cellulitis 03/18/2020   Acute cystitis without hematuria    Stage 3b chronic kidney disease (CKD) (HCC) - baseline SCr 1.8-1.9 01/20/2020   Decubitus ulcers 01/17/2020   Sepsis (HCC) 01/16/2020   Cellulitis of sacral region 01/16/2020   Acute kidney injury superimposed on chronic kidney disease (HCC) 01/16/2020   Acute metabolic encephalopathy 01/16/2020   Bipolar disorder, in full remission, most recent episode mixed (HCC) 12/29/2019   High risk medication use 10/25/2019   Noncompliance with treatment plan 10/25/2019   Bipolar I disorder, most recent episode mixed (HCC) 01/07/2019   GAD (generalized anxiety disorder) 01/07/2019   Insomnia due to medical condition 01/07/2019   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) 03/06/2016   Bipolar disorder (HCC) 01/03/2016   Lithium toxicity 01/02/2016   Chronic anticoagulation - on Eliquis 07/06/2014   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/06/2014   Hyperlipidemia 07/06/2014   Hypertension 07/06/2014   Leg swelling 07/06/2014   Obesity, Class II, BMI 35-39.9, with comorbidity 07/06/2014   On medication for venous  thromboembolism 07/06/2014   Sleep apnea 07/06/2014   Type II diabetes mellitus with renal manifestations (HCC) 07/06/2014   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   Venous thromboembolism (VTE) 07/06/2014   PCP:  Dione Booze, MD Pharmacy:   [facility] [address] [place] - [address] [place] [address] Phone: [phone] Fax: [phone]     Social Determinants of Health (SDOH) Interventions    Readmission Risk Interventions    01/07/2022   10:14 AM 04/15/2021    2:50 PM  Readmission Risk Prevention Plan  Transportation Screening Complete Complete  PCP or Specialist Appt within 3-5 Days  Complete  HRI or Home Care Consult  Complete  Social Work Consult for Recovery Care Planning/Counseling  Complete  Palliative Care Screening  Not Applicable  Medication Review [employer]) Complete Complete  PCP or Specialist appointment within 3-5 days of discharge Complete   HRI or Home Care Consult Complete   SW Recovery Care/Counseling Consult Complete   Palliative Care Screening Not Applicable   Skilled Nursing Facility Patient Refused

## 2022-01-07 NOTE — Discharge Summary (Signed)
Physician Discharge Summary  [name] [id number] DOB: 10-20-1955 DOA: 01/04/2022  PCP: [name], MD  Admit date: 01/04/2022 Discharge date: 01/07/2022  Admitted From: Home Disposition: Home  Recommendations for Outpatient Follow-up:  Follow up with PCP in 1-2 weeks Please obtain BMP/CBC in one week Wound care instructions as addressed  Home Health: PT/OT/RN Equipment/Devices: Available at home  Discharge Condition: Fair CODE STATUS: Full code Diet recommendation: Low-salt low-carb diet  Discharge summary: 66 year old gentleman with history of traumatic brain injury, bipolar mood disorder, CKD stage IIIb, diabetes melitis, hypertension, recurrent DVT on Eliquis, neurogenic bladder with chronic indwelling Foley catheter, 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  Lives at home with wife.  In the emergency room lactic acid 1.4-3, urine was grossly abnormal.  Cultures were drawn and patient was admitted to the hospital with IV antibiotics.  Patient was found to have E. coli bacteremia secondary to UTI.  Clinically improved today.     Assessment & plan of care:   Sepsis present on admission secondary to E. coli UTI and E. coli bacteremia due to indwelling Foley catheter: Acute metabolic infective encephalopathy. Clinically improved.  Blood pressure stabilized.  Mental status cleared.   Urine culture with multiple species. Blood cultures with E. coli, pansensitive. Received Rocephin 2 g daily, day 3 today.  With clinical improvement, will continue oral antibiotics for total of 10 days, prescribed Augmentin 875 twice daily for another 7 days to complete total 10 days of therapy to treat for complicated UTI/pyelonephritis. Catheter exchanged in the ER. CT scan with bilateral perinephric edema.  Urine output is adequate.   Multiple pressure  ulcers including Stage IV sacral decubitus ulcer Bilateral heel unstageable ulcers, present on admission.  Wound care instructions.  Already doing at home.   Recurrent DVT: On Eliquis.  Chronic issues remained stable on appropriate home medications as below.  Neurogenic bladder with indwelling Foley catheter, has well-established urology follow-up.  Diverting colostomy, functioning well  Anemia of chronic disease, hemoglobin at about baseline.  CKD stage IIIb with baseline creatinine 1.8-1.9, at about baseline.  Please recheck in 1 week.  Type 2 diabetes on glipizide, blood sugars are stable.  Hypertension, with fluctuating renal functions antihypertensives including ARB was discontinued.  He has not needed any blood pressure medications.  We will need to check blood pressures in ambulatory settings to determine blood pressure agent.  If persistently elevated at office visits, may probably do well with single agent with amlodipine.  Bipolar disorder with behavioral disturbances, well controlled on Trileptal and Seroquel as well as Namenda.  Has adequate support system at home.  Does have wound care instructions available at home.  He will benefit with continue to work with PT OT to improve his strength.  Stable to discharge home.  High risk of readmission due to indwelling catheter.       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 Cover with xeroform gauze, top with dry gauze and secure with Kerlix roll gauze. Place feet into Prevalon boots.   Comments: Wound care to sacral chronic, nonhealing Stage 4 pressure injury (POA): cleanse with sodium hypochlorite solution, pat dry. Fill defect with gauze moistened with sodium hypochlorite solution (Dakin's solution). Top with dry gauze, ABD pad and secure with paper tape. Change twice daily and PRN dressing dislodgement.      Increase activity slowly   Complete by: As directed       Allergies as of 01/07/2022   No Known Allergies      Medication List     STOP taking these medications    amLODipine-olmesartan 5-20 MG tablet Commonly known as: AZOR       TAKE these medications    amoxicillin-clavulanate 875-125 MG tablet Commonly known as: AUGMENTIN Take 1 tablet by mouth 2 (two) times daily for 7 days.   ascorbic acid 500 MG tablet Commonly known as: VITAMIN C Take 1 tablet (500 mg total) by mouth daily.   Eliquis 5 MG Tabs tablet Generic drug: apixaban Take 5 mg by mouth 2 (two) times daily.   fish oil-omega-3 fatty acids 1000 MG capsule SMARTSIG:1 Capsule(s) By Mouth Daily   glipiZIDE 2.5 MG 24 hr tablet Commonly known as: GLUCOTROL XL Take 1 tablet (2.5 mg total) by mouth daily with breakfast.   memantine 10 MG tablet Commonly known as: NAMENDA Take 10 mg by mouth 2 (two) times daily.   Oxcarbazepine 300 MG tablet Commonly known as: TRILEPTAL Take 1 tablet (300 mg total) by mouth 2 (two) times daily.   QUEtiapine  50 MG tablet Commonly known as: SEROQUEL TAKE 1/2 TO 1 (ONE-HALF TO ONE) TABLET BY MOUTH ONCE DAILY AS NEEDED FOR  AGITATION   QUEtiapine 100 MG tablet Commonly known as: SEROQUEL Take 1 tablet (100 mg total) by mouth at bedtime.   rosuvastatin 20 MG tablet Commonly known as: CRESTOR Take 20 mg by mouth daily.   vitamin B-12 1000 MCG tablet Commonly known as: CYANOCOBALAMIN Take 3,000 mcg by mouth daily.   Vitamin D3 25 MCG tablet Commonly known as: Vitamin D Take 1 tablet (1,000 Units total) by mouth daily.   zinc sulfate 220 (50 Zn) MG capsule Take 1 capsule (220 mg total) by mouth daily.               Discharge Care Instructions  (From admission, onward)           Start     Ordered   01/07/22 0000  Discharge wound care:       Comments: Comments: daily Wound care to bilateral heel pressure injuries (Right Stage 3 and Left Unstageable, both POA):  Cleanse with NS, pat dry. Cover with xeroform gauze, top with dry gauze and secure with Kerlix roll gauze. Place feet into Prevalon boots.   Comments: Wound care to sacral chronic, nonhealing Stage 4 pressure injury (POA):  cleanse with sodium hypochlorite solution, pat dry. Fill defect with gauze moistened with sodium hypochlorite solution (Dakin's solution). Top with dry gauze, ABD pad and secure with paper tape. Change twice daily and PRN dressing dislodgement.      01/07/22 0912            No Known Allergies  Consultations: None   Procedures/Studies: CT ABDOMEN PELVIS W CONTRAST  Result Date: 01/05/2022 CLINICAL DATA:  Sepsis.  Confusion.  Fever. EXAM: CT ABDOMEN AND PELVIS WITH CONTRAST TECHNIQUE: Multidetector CT imaging of the abdomen and pelvis was performed using the standard protocol following bolus administration of intravenous contrast. RADIATION DOSE REDUCTION: This exam was performed according to the departmental dose-optimization program which includes automated exposure control, adjustment of the  mA and/or kV according to patient size and/or use of iterative reconstruction technique. CONTRAST:  30mL OMNIPAQUE IOHEXOL 300 MG/ML  SOLN COMPARISON:  CT 10/06/2021 FINDINGS: Lower chest: No confluent airspace disease or pleural effusion. There are coronary artery calcifications. Hepatobiliary: Tiny subcapsular hypodensity in the right hepatic lobe is unchanged, too small to characterize but likely cyst. Gallbladder physiologically distended, no calcified stone. No biliary dilatation. Pancreas: No ductal dilatation or inflammation. Spleen: Normal in size without focal abnormality. Adrenals/Urinary Tract: No adrenal nodule. Moderate right greater than left perinephric edema. Mild prominence of the right renal pelvis and slight ureteral hyperemia. Stranding adjacent to the right ureter. No renal calculi or focal lesion. Foley catheter in the bladder, mild bladder wall thickening. Stomach/Bowel: Left-sided loop no bowel obstruction or inflammation. Normal appendix visualized. Redundant sigmoid colon. Colostomy. Vascular/Lymphatic: Aortic and branch atherosclerosis. No aortic aneurysm. Prominent retroperitoneal lymph nodes, none of which are enlarged by size criteria. There prominent bilateral inguinal nodes, right greater than left. Calcification within the left common femoral vein. Reproductive: Prostate is unremarkable. Other: No ascites or free air. Small fat containing umbilical hernia. Musculoskeletal: Large sacral decubitus ulcer extends to bone with subjacent erosive change and truncation of the coccyx. There is a right hip joint effusion, increased in size from prior exam. Advanced bilateral hip osteoarthritis. Diffuse degenerative change in the lumbar spine. Paraspinal muscle fatty atrophy. IMPRESSION: 1. Moderate right greater than left perinephric edema with prominence of the right renal pelvis and slight ureteral hyperemia. Findings may be due to urinary tract infection. Recommend correlation with  urinalysis. 2. Large sacral decubitus ulcer extends to bone with subjacent erosive change and truncation of the coccyx, suspicious for chronic osteomyelitis. No significant change from April. 3. Right hip joint effusion, slightly increased in size from prior exam. 4. Prominent bilateral inguinal nodes, right greater than left, likely reactive. Aortic Atherosclerosis (ICD10-I70.0). Electronically Signed   By: Keith Rake M.D.   On: 01/05/2022 02:13   CT HEAD WO CONTRAST (5MM)  Result Date: 01/05/2022 CLINICAL DATA:  Mental status change, unknown cause EXAM: CT HEAD WITHOUT CONTRAST TECHNIQUE: Contiguous axial images were obtained from the base of the skull through the vertex without intravenous contrast. RADIATION DOSE REDUCTION: This exam was performed according to the departmental dose-optimization program which includes automated exposure control, adjustment of the mA and/or kV according to patient size and/or use of iterative reconstruction technique. COMPARISON:  Head CT 12/16/2021 FINDINGS: Brain: No intracranial hemorrhage, mass effect, or midline shift. Stable degree of atrophy and chronic small vessel ischemia. No hydrocephalus. The basilar cisterns are patent. No evidence of territorial infarct or acute ischemia. No extra-axial or intracranial fluid collection. Vascular: Atherosclerosis of skullbase vasculature without hyperdense vessel or abnormal calcification. Skull: No fracture  or focal lesion. Sinuses/Orbits: Paranasal sinuses and mastoid air cells are clear. The visualized orbits are unremarkable. Other: None. IMPRESSION: 1. No acute intracranial abnormality. 2. Stable atrophy and chronic small vessel ischemia. Electronically Signed   By: Keith Rake M.D.   On: 01/05/2022 02:03   DG Chest Port 1 View  Result Date: 01/04/2022 CLINICAL DATA:  Fevers EXAM: PORTABLE CHEST 1 VIEW COMPARISON:  12/16/2021 FINDINGS: Cardiac shadow is within normal limits. Lungs are well aerated bilaterally. No  focal infiltrate or effusion is seen. No bony abnormality is noted. IMPRESSION: No active disease. Electronically Signed   By: Inez Catalina M.D.   On: 01/04/2022 23:16   DG Foot 2 Views Left  Result Date: 12/16/2021 CLINICAL DATA:  66 year old male with history of chronic bilateral heel wounds with history of fall. EXAM: LEFT FOOT - 2 VIEW COMPARISON:  Left ankle radiograph 10/06/2021. FINDINGS: Three views two views of the left foot demonstrate no definite acute displaced fracture. There is some lucency in the medial aspect of the base of the fifth proximal phalanx, with surrounding sclerosis. No other definite destructive osseous lesions are noted. Advanced degenerative changes are noted throughout the foot, with widespread joint space narrowing, subchondral sclerosis, subchondral cyst formation and osteophyte formation, indicative of osteoarthritis. Lateral projection demonstrates soft tissue irregularity posterior to the calcaneus, presumably the reported soft tissue ulcer. Some areas of irregular sclerosis are noted in the dorsal aspect of the calcaneus, slightly increased in prominence compared to the prior examination. Diffuse soft tissue swelling. IMPRESSION: 1. Apparent soft tissue ulcer dorsal to the calcaneus with increasing areas of sclerosis in the dorsal aspect of the calcaneus. The possibility of chronic osteomyelitis is not excluded. There is also a lucent area in the medial aspect of the base of the fifth proximal phalanx, which could be degenerative, but an additional focus of infection is not excluded, particularly in this diabetic patient. Given these findings in the diffuse soft tissue swelling in the foot, consideration for further evaluation with left foot MRI with and without IV gadolinium is suggested. Electronically Signed   By: Vinnie Langton M.D.   On: 12/16/2021 07:19   DG Pelvis 1-2 Views  Result Date: 12/16/2021 CLINICAL DATA:  66 year old male history of trauma from a fall.  EXAM: PELVIS - 1-2 VIEW COMPARISON:  No priors. FINDINGS: Single AP view of the bony pelvis demonstrates no definite acute displaced fracture of the bony pelvic ring or either of the proximal femurs as visualized. Bilateral femoral heads appear located on this single view examination. There is extensive joint space narrowing, subchondral sclerosis, subchondral cyst formation and osteophyte formation in the hip joints bilaterally, indicative of advanced osteoarthritis. IMPRESSION: 1. No acute radiographic abnormality of the bony pelvis. 2. Advanced bilateral hip joint osteoarthritis. Electronically Signed   By: Vinnie Langton M.D.   On: 12/16/2021 07:16   CT HEAD WO CONTRAST (5MM)  Result Date: 12/16/2021 CLINICAL DATA:  66 year old male with history of trauma from a fall. Fever. EXAM: CT HEAD WITHOUT CONTRAST CT CERVICAL SPINE WITHOUT CONTRAST TECHNIQUE: Multidetector CT imaging of the head and cervical spine was performed following the standard protocol without intravenous contrast. Multiplanar CT image reconstructions of the cervical spine were also generated. RADIATION DOSE REDUCTION: This exam was performed according to the departmental dose-optimization program which includes automated exposure control, adjustment of the mA and/or kV according to patient size and/or use of iterative reconstruction technique. COMPARISON:  No priors. FINDINGS: CT HEAD FINDINGS Brain: Patchy areas of decreased  attenuation are noted throughout the deep and periventricular white matter of the cerebral hemispheres bilaterally, compatible with mild chronic microvascular ischemic disease. No evidence of acute infarction, hemorrhage, hydrocephalus, extra-axial collection or mass lesion/mass effect. Vascular: No hyperdense vessel or unexpected calcification. Skull: Normal. Negative for fracture or focal lesion. Sinuses/Orbits: No acute finding. Other: None. CT CERVICAL SPINE FINDINGS Alignment: Normal. Skull base and vertebrae: No  acute fracture. No primary bone lesion or focal pathologic process. Soft tissues and spinal canal: No prevertebral fluid or swelling. No visible canal hematoma. Disc levels: Multilevel degenerative disc disease most severe at C3-C4, C4-C5, C5-C6 and C6-C7, with extensive osteophytosis throughout these levels. Mild multilevel facet arthropathy. Upper chest: Visualized portions are unremarkable. Other: None. IMPRESSION: 1. No evidence of significant acute traumatic injury to the skull, brain or cervical spine. 2. Mild chronic microvascular ischemic changes in the cerebral white matter, as above. 3. Multilevel degenerative disc disease and cervical spondylosis, as above. Electronically Signed   By: Vinnie Langton M.D.   On: 12/16/2021 05:11   CT Cervical Spine Wo Contrast  Result Date: 12/16/2021 CLINICAL DATA:  66 year old male with history of trauma from a fall. Fever. EXAM: CT HEAD WITHOUT CONTRAST CT CERVICAL SPINE WITHOUT CONTRAST TECHNIQUE: Multidetector CT imaging of the head and cervical spine was performed following the standard protocol without intravenous contrast. Multiplanar CT image reconstructions of the cervical spine were also generated. RADIATION DOSE REDUCTION: This exam was performed according to the departmental dose-optimization program which includes automated exposure control, adjustment of the mA and/or kV according to patient size and/or use of iterative reconstruction technique. COMPARISON:  No priors. FINDINGS: CT HEAD FINDINGS Brain: Patchy areas of decreased attenuation are noted throughout the deep and periventricular white matter of the cerebral hemispheres bilaterally, compatible with mild chronic microvascular ischemic disease. No evidence of acute infarction, hemorrhage, hydrocephalus, extra-axial collection or mass lesion/mass effect. Vascular: No hyperdense vessel or unexpected calcification. Skull: Normal. Negative for fracture or focal lesion. Sinuses/Orbits: No acute  finding. Other: None. CT CERVICAL SPINE FINDINGS Alignment: Normal. Skull base and vertebrae: No acute fracture. No primary bone lesion or focal pathologic process. Soft tissues and spinal canal: No prevertebral fluid or swelling. No visible canal hematoma. Disc levels: Multilevel degenerative disc disease most severe at C3-C4, C4-C5, C5-C6 and C6-C7, with extensive osteophytosis throughout these levels. Mild multilevel facet arthropathy. Upper chest: Visualized portions are unremarkable. Other: None. IMPRESSION: 1. No evidence of significant acute traumatic injury to the skull, brain or cervical spine. 2. Mild chronic microvascular ischemic changes in the cerebral white matter, as above. 3. Multilevel degenerative disc disease and cervical spondylosis, as above. Electronically Signed   By: Vinnie Langton M.D.   On: 12/16/2021 05:11   DG Chest 1 View  Result Date: 12/16/2021 CLINICAL DATA:  Sepsis, fever EXAM: CHEST  1 VIEW COMPARISON:  10/06/2021 FINDINGS: Lungs volumes are small, but are symmetric and are clear. No pneumothorax or pleural effusion. Cardiac size within normal limits. Pulmonary vascularity is normal. Osseous structures are age-appropriate. No acute bone abnormality. IMPRESSION: Pulmonary hypoinflation. Electronically Signed   By: Fidela Salisbury M.D.   On: 12/16/2021 01:45   (Echo, Carotid, EGD, Colonoscopy, ERCP)    Subjective: Patient seen and examined.  No overnight events.  Afebrile.  He himself denies any complaints.  Eager to go home.  Called and discussed with patient's wife.   Discharge Exam: Vitals:   01/07/22 0345 01/07/22 0846  BP: (!) 141/82 (!) 155/83  Pulse: 72 82  Resp:  20 18  Temp: 98.6 F (37 C) 98.6 F (37 C)  SpO2: 100% 100%   Vitals:   01/06/22 1800 01/06/22 1956 01/07/22 0345 01/07/22 0846  BP: 123/74 (!) 142/73 (!) 141/82 (!) 155/83  Pulse: 82 76 72 82  Resp: 20 20 20 18   Temp: 98.8 F (37.1 C) 98.4 F (36.9 C) 98.6 F (37 C) 98.6 F (37 C)   TempSrc: Oral     SpO2: 97% 100% 100% 100%  Weight:      Height:        General: Pt is alert, awake, not in acute distress Frail and debilitated.  Chronically sick looking.  Has flat affect.  Alert oriented x4. Cardiovascular: RRR, S1/S2 +, no rubs, no gallops Respiratory: No added sounds. Abdominal: Soft, NT, ND, bowel sounds + Colostomy with normal looking stool. Foley catheter with clear urine. Sacral decubitus ulcer, tunneling but clean.  Picture in the chart. Bilateral lower extremity ulcers, unstageable ulcers on the heel.  Dressing applied.    The results of significant diagnostics from this hospitalization (including imaging, microbiology, ancillary and laboratory) are listed below for reference.     Microbiology: Recent Results (from the past 240 hour(s))  Blood Culture (routine x 2)     Status: Abnormal   Collection Time: 01/04/22 10:56 PM   Specimen: BLOOD  Result Value Ref Range Status   Specimen Description   Final    BLOOD RIGHT ANTECUBITAL Performed at [facility], [address], [place], [place]    Special Requests   Final    BOTTLES DRAWN AEROBIC AND ANAEROBIC Blood Culture adequate volume Performed at [facility], [address], [place], [place]    Culture  Setup Time   Final    GRAM NEGATIVE RODS AEROBIC BOTTLE ONLY CRITICAL RESULT CALLED TO, READ BACK BY AND VERIFIED WITH: CARISSA [facility] ON 01/05/22 AT 1120 QSD Performed at [facility], [address] [place], [place]    Culture (A)  Final    ESCHERICHIA COLI SUSCEPTIBILITIES PERFORMED ON PREVIOUS CULTURE WITHIN THE LAST 5 DAYS. Performed at [facility], [address], [place], [place]    Report Status 01/07/2022 FINAL  Final  Blood Culture (routine x 2)     Status: Abnormal   Collection Time: 01/04/22 10:56 PM   Specimen: BLOOD  Result Value Ref Range Status   Specimen Description   Final    BLOOD BLOOD LEFT  FOREARM Performed at [facility], [address] [place], [place]    Special Requests   Final    BOTTLES DRAWN AEROBIC AND ANAEROBIC Blood Culture adequate volume Performed at [facility], [address] [place], [place]    Culture  Setup Time   Final    Organism ID to follow GRAM NEGATIVE RODS IN BOTH AEROBIC AND ANAEROBIC BOTTLES CRITICAL RESULT CALLED TO, READ BACK BY AND VERIFIED WITH: CARISSA DOLAN ON 01/05/22 AT 1120 QSD Performed at [facility], [address] [place], [place]    Culture ESCHERICHIA COLI (A)  Final   Report Status 01/07/2022 FINAL  Final   Organism ID, Bacteria ESCHERICHIA COLI  Final      Susceptibility   Escherichia coli - MIC*    AMPICILLIN 8 SENSITIVE Sensitive     CEFAZOLIN <=4 SENSITIVE Sensitive     CEFEPIME <=0.12 SENSITIVE Sensitive     CEFTAZIDIME <=1 SENSITIVE Sensitive  CEFTRIAXONE <=0.25 SENSITIVE Sensitive     CIPROFLOXACIN <=0.25 SENSITIVE Sensitive     GENTAMICIN <=1 SENSITIVE Sensitive     IMIPENEM <=0.25 SENSITIVE Sensitive     TRIMETH/SULFA <=20 SENSITIVE Sensitive     AMPICILLIN/SULBACTAM <=2 SENSITIVE Sensitive     PIP/TAZO <=4 SENSITIVE Sensitive     * ESCHERICHIA COLI  Urine Culture     Status: Abnormal   Collection Time: 01/04/22 10:56 PM   Specimen: In/Out Cath Urine  Result Value Ref Range Status   Specimen Description   Final    IN/OUT CATH URINE Performed at [facility], [address], [place], [place] [address]    Special Requests   Final    NONE Performed at [facility], [address], [place], [place] [address]    Culture (A)  Final    >=100,000 COLONIES/mL MULTIPLE SPECIES PRESENT, SUGGEST RECOLLECTION   Report Status 01/06/2022 FINAL  Final  SARS Coronavirus 2 by RT PCR (hospital order, performed in [facility] hospital lab) *cepheid single result test* Anterior Nasal Swab     Status: None   Collection Time: 01/04/22 10:56  PM   Specimen: Anterior Nasal Swab  Result Value Ref Range Status   SARS Coronavirus 2 by RT PCR NEGATIVE NEGATIVE Final    Comment: (NOTE) SARS-CoV-2 target nucleic acids are NOT DETECTED.  The SARS-CoV-2 RNA is generally detectable in upper and lower respiratory specimens during the acute phase of infection. The lowest concentration of SARS-CoV-2 viral copies this assay can detect is 250 copies / mL. A negative result does not preclude SARS-CoV-2 infection and should not be used as the sole basis for treatment or other patient management decisions.  A negative result may occur with improper specimen collection / handling, submission of specimen other than nasopharyngeal swab, presence of viral mutation(s) within the areas targeted by this assay, and inadequate number of viral copies (<250 copies / mL). A negative result must be combined with clinical observations, patient history, and epidemiological information.  Fact Sheet for Patients:   RoadLapTop.co.za  Fact Sheet for Healthcare Providers: http://kim-miller.com/  This test is not yet approved or  cleared by the [place] FDA and has been authorized for detection and/or diagnosis of SARS-CoV-2 by FDA under an Emergency Use Authorization (EUA).  This EUA will remain in effect (meaning this test can be used) for the duration of the COVID-19 declaration under Section 564(b)(1) of the Act, 21 U.S.C. section 360bbb-3(b)(1), unless the authorization is terminated or revoked sooner.  Performed at [facility], [address] [place], [place] [address]   Blood Culture ID Panel (Reflexed)     Status: Abnormal   Collection Time: 01/04/22 10:56 PM  Result Value Ref Range Status   Enterococcus faecalis NOT DETECTED NOT DETECTED Final   Enterococcus Faecium NOT DETECTED NOT DETECTED Final   Listeria monocytogenes NOT DETECTED NOT DETECTED Final   Staphylococcus species NOT  DETECTED NOT DETECTED Final   Staphylococcus aureus (BCID) NOT DETECTED NOT DETECTED Final   Staphylococcus epidermidis NOT DETECTED NOT DETECTED Final   Staphylococcus lugdunensis NOT DETECTED NOT DETECTED Final   Streptococcus species NOT DETECTED NOT DETECTED Final   Streptococcus agalactiae NOT DETECTED NOT DETECTED Final   Streptococcus pneumoniae NOT DETECTED NOT DETECTED Final   Streptococcus pyogenes NOT DETECTED NOT DETECTED Final   A.calcoaceticus-baumannii NOT DETECTED NOT DETECTED Final   Bacteroides fragilis NOT DETECTED NOT DETECTED Final   Enterobacterales DETECTED (A) NOT DETECTED Final    Comment:  Enterobacterales represent a large order of gram negative bacteria, not a single organism. CRITICAL RESULT CALLED TO, READ BACK BY AND VERIFIED WITH: CARISSA DOLAN ON 01/05/22 AT 1120 QSD    Enterobacter cloacae complex NOT DETECTED NOT DETECTED Final   Escherichia coli DETECTED (A) NOT DETECTED Final    Comment: CRITICAL RESULT CALLED TO, READ BACK BY AND VERIFIED WITH: CARISSA DOLAN ON 01/05/22 AT 1120 QSD    Klebsiella aerogenes NOT DETECTED NOT DETECTED Final   Klebsiella oxytoca NOT DETECTED NOT DETECTED Final   Klebsiella pneumoniae NOT DETECTED NOT DETECTED Final   Proteus species NOT DETECTED NOT DETECTED Final   Salmonella species NOT DETECTED NOT DETECTED Final   Serratia marcescens NOT DETECTED NOT DETECTED Final   Haemophilus influenzae NOT DETECTED NOT DETECTED Final   Neisseria meningitidis NOT DETECTED NOT DETECTED Final   Pseudomonas aeruginosa NOT DETECTED NOT DETECTED Final   Stenotrophomonas maltophilia NOT DETECTED NOT DETECTED Final   Candida albicans NOT DETECTED NOT DETECTED Final   Candida auris NOT DETECTED NOT DETECTED Final   Candida glabrata NOT DETECTED NOT DETECTED Final   Candida krusei NOT DETECTED NOT DETECTED Final   Candida parapsilosis NOT DETECTED NOT DETECTED Final   Candida tropicalis NOT DETECTED NOT DETECTED Final   Cryptococcus  neoformans/gattii NOT DETECTED NOT DETECTED Final   CTX-M ESBL NOT DETECTED NOT DETECTED Final   Carbapenem resistance IMP NOT DETECTED NOT DETECTED Final   Carbapenem resistance KPC NOT DETECTED NOT DETECTED Final   Carbapenem resistance NDM NOT DETECTED NOT DETECTED Final   Carbapenem resist OXA 48 LIKE NOT DETECTED NOT DETECTED Final   Carbapenem resistance VIM NOT DETECTED NOT DETECTED Final    Comment: Performed at [facility], [address] [place], [place] [address]     Labs: BNP (last 3 results) Recent Labs    07/28/21 0426  BNP [id number]   Basic Metabolic Panel: Recent Labs  Lab 01/04/22 2255 01/06/22 0559  NA 127* 133*  K 5.2* 4.1  CL 101 106  CO2 20* 21*  GLUCOSE 194* 90  BUN 27* 24*  CREATININE 1.93* 2.05*  CALCIUM 8.1* 8.4*  MG  --  1.9  PHOS  --  3.1   Liver Function Tests: Recent Labs  Lab 01/04/22 2255 01/06/22 0559  AST 20 15  ALT 14 14  ALKPHOS 99 86  BILITOT 1.2 0.4  PROT 8.5* 7.9  ALBUMIN 2.8* 2.7*   No results for input(s): "LIPASE", "AMYLASE" in the last 168 hours. Recent Labs  Lab 01/04/22 2256  AMMONIA 19   CBC: Recent Labs  Lab 01/04/22 2255 01/06/22 0559  WBC 11.2* 7.4  NEUTROABS 8.6* 5.3  HGB 8.3* 8.6*  HCT 27.0* 28.5*  MCV 86.3 87.7  PLT 184 176   Cardiac Enzymes: No results for input(s): "CKTOTAL", "CKMB", "CKMBINDEX", "TROPONINI" in the last 168 hours. BNP: Invalid input(s): "POCBNP" CBG: Recent Labs  Lab 01/06/22 1208 01/06/22 1803 01/06/22 1956 01/06/22 2255 01/07/22 0743  GLUCAP 112* 171* 140* 127* 93   D-Dimer No results for input(s): "DDIMER" in the last 72 hours. Hgb A1c Recent Labs    01/05/22 0554  HGBA1C 6.8*   Lipid Profile No results for input(s): "CHOL", "HDL", "LDLCALC", "TRIG", "CHOLHDL", "LDLDIRECT" in the last 72 hours. Thyroid function studies No results for input(s): "TSH", "T4TOTAL", "T3FREE", "THYROIDAB" in the last 72 hours.  Invalid input(s): "FREET3" Anemia work  up No results for input(s): "VITAMINB12", "FOLATE", "FERRITIN", "TIBC", "IRON", "RETICCTPCT" in the last 72 hours. Urinalysis  Component Value Date/Time   COLORURINE YELLOW (A) 01/04/2022 2256   APPEARANCEUR CLOUDY (A) 01/04/2022 2256   APPEARANCEUR Cloudy (A) 08/09/2021 1559   LABSPEC 1.012 01/04/2022 2256   PHURINE 6.0 01/04/2022 2256   GLUCOSEU NEGATIVE 01/04/2022 2256   HGBUR LARGE (A) 01/04/2022 2256   BILIRUBINUR NEGATIVE 01/04/2022 2256   BILIRUBINUR Negative 08/09/2021 1559   KETONESUR NEGATIVE 01/04/2022 2256   PROTEINUR 100 (A) 01/04/2022 2256   NITRITE NEGATIVE 01/04/2022 2256   LEUKOCYTESUR LARGE (A) 01/04/2022 2256   Sepsis Labs Recent Labs  Lab 01/04/22 2255 01/06/22 0559  WBC 11.2* 7.4   Microbiology Recent Results (from the past 240 hour(s))  Blood Culture (routine x 2)     Status: Abnormal   Collection Time: 01/04/22 10:56 PM   Specimen: BLOOD  Result Value Ref Range Status   Specimen Description   Final    BLOOD RIGHT ANTECUBITAL Performed at [facility], [address] [place], [place] [address]    Special Requests   Final    BOTTLES DRAWN AEROBIC AND ANAEROBIC Blood Culture adequate volume Performed at [facility], [address] [place], [place] [address]    Culture  Setup Time   Final    GRAM NEGATIVE RODS AEROBIC BOTTLE ONLY CRITICAL RESULT CALLED TO, READ BACK BY AND VERIFIED WITH: CARISSA [facility] ON 01/05/22 AT 1120 QSD Performed at [facility], [address] [place], [place] [address]    Culture (A)  Final    ESCHERICHIA COLI SUSCEPTIBILITIES PERFORMED ON PREVIOUS CULTURE WITHIN THE LAST 5 DAYS. Performed at [facility], [address] [place], [place] [address]    Report Status 01/07/2022 FINAL  Final  Blood Culture (routine x 2)     Status: Abnormal   Collection Time: 01/04/22 10:56 PM   Specimen: BLOOD  Result Value Ref Range Status   Specimen Description   Final    BLOOD BLOOD LEFT  FOREARM Performed at [facility], [address] [place], [place] [address]    Special Requests   Final    BOTTLES DRAWN AEROBIC AND ANAEROBIC Blood Culture adequate volume Performed at [facility], [address] [place], [place] [address]    Culture  Setup Time   Final    Organism ID to follow GRAM NEGATIVE RODS IN BOTH AEROBIC AND ANAEROBIC BOTTLES CRITICAL RESULT CALLED TO, READ BACK BY AND VERIFIED WITH: CARISSA DOLAN ON 01/05/22 AT 1120 QSD Performed at [facility], [address] [place], [place] [address]    Culture ESCHERICHIA COLI (A)  Final   Report Status 01/07/2022 FINAL  Final   Organism ID, Bacteria ESCHERICHIA COLI  Final      Susceptibility   Escherichia coli - MIC*    AMPICILLIN 8 SENSITIVE Sensitive     CEFAZOLIN <=4 SENSITIVE Sensitive     CEFEPIME <=0.12 SENSITIVE Sensitive     CEFTAZIDIME <=1 SENSITIVE Sensitive     CEFTRIAXONE <=0.25 SENSITIVE Sensitive     CIPROFLOXACIN <=0.25 SENSITIVE Sensitive     GENTAMICIN <=1 SENSITIVE Sensitive     IMIPENEM <=0.25 SENSITIVE Sensitive     TRIMETH/SULFA <=20 SENSITIVE Sensitive     AMPICILLIN/SULBACTAM <=2 SENSITIVE Sensitive     PIP/TAZO <=4 SENSITIVE Sensitive     * ESCHERICHIA COLI  Urine Culture     Status: Abnormal   Collection Time: 01/04/22 10:56 PM   Specimen: In/Out Cath Urine  Result Value Ref Range Status   Specimen Description  Final    IN/OUT CATH URINE Performed at [facility], [address] [place], [place] [address]    Special Requests   Final    NONE Performed at [facility], [address] [place], [place] [address]    Culture (A)  Final    >=100,000 COLONIES/mL MULTIPLE SPECIES PRESENT, SUGGEST RECOLLECTION   Report Status 01/06/2022 FINAL  Final  SARS Coronavirus 2 by RT PCR (hospital order, performed in [facility] hospital lab) *cepheid single result test* Anterior Nasal Swab     Status: None   Collection Time: 01/04/22 10:56  PM   Specimen: Anterior Nasal Swab  Result Value Ref Range Status   SARS Coronavirus 2 by RT PCR NEGATIVE NEGATIVE Final    Comment: (NOTE) SARS-CoV-2 target nucleic acids are NOT DETECTED.  The SARS-CoV-2 RNA is generally detectable in upper and lower respiratory specimens during the acute phase of infection. The lowest concentration of SARS-CoV-2 viral copies this assay can detect is 250 copies / mL. A negative result does not preclude SARS-CoV-2 infection and should not be used as the sole basis for treatment or other patient management decisions.  A negative result may occur with improper specimen collection / handling, submission of specimen other than nasopharyngeal swab, presence of viral mutation(s) within the areas targeted by this assay, and inadequate number of viral copies (<250 copies / mL). A negative result must be combined with clinical observations, patient history, and epidemiological information.  Fact Sheet for Patients:   https://www.patel.info/  Fact Sheet for Healthcare Providers: https://hall.com/  This test is not yet approved or  cleared by the [place] FDA and has been authorized for detection and/or diagnosis of SARS-CoV-2 by FDA under an Emergency Use Authorization (EUA).  This EUA will remain in effect (meaning this test can be used) for the duration of the COVID-19 declaration under Section 564(b)(1) of the Act, 21 U.S.C. section 360bbb-3(b)(1), unless the authorization is terminated or revoked sooner.  Performed at [facility], [address] [place], [place] [address]   Blood Culture ID Panel (Reflexed)     Status: Abnormal   Collection Time: 01/04/22 10:56 PM  Result Value Ref Range Status   Enterococcus faecalis NOT DETECTED NOT DETECTED Final   Enterococcus Faecium NOT DETECTED NOT DETECTED Final   Listeria monocytogenes NOT DETECTED NOT DETECTED Final   Staphylococcus species NOT  DETECTED NOT DETECTED Final   Staphylococcus aureus (BCID) NOT DETECTED NOT DETECTED Final   Staphylococcus epidermidis NOT DETECTED NOT DETECTED Final   Staphylococcus lugdunensis NOT DETECTED NOT DETECTED Final   Streptococcus species NOT DETECTED NOT DETECTED Final   Streptococcus agalactiae NOT DETECTED NOT DETECTED Final   Streptococcus pneumoniae NOT DETECTED NOT DETECTED Final   Streptococcus pyogenes NOT DETECTED NOT DETECTED Final   A.calcoaceticus-baumannii NOT DETECTED NOT DETECTED Final   Bacteroides fragilis NOT DETECTED NOT DETECTED Final   Enterobacterales DETECTED (A) NOT DETECTED Final    Comment: Enterobacterales represent a large order of gram negative bacteria, not a single organism. CRITICAL RESULT CALLED TO, READ BACK BY AND VERIFIED WITH: CARISSA DOLAN ON 01/05/22 AT 1120 QSD    Enterobacter cloacae complex NOT DETECTED NOT DETECTED Final   Escherichia coli DETECTED (A) NOT DETECTED Final    Comment: CRITICAL RESULT CALLED TO, READ BACK BY AND VERIFIED WITH: CARISSA DOLAN ON 01/05/22 AT 1120 QSD    Klebsiella aerogenes NOT DETECTED NOT DETECTED Final   Klebsiella oxytoca NOT DETECTED NOT DETECTED Final   Klebsiella  pneumoniae NOT DETECTED NOT DETECTED Final   Proteus species NOT DETECTED NOT DETECTED Final   Salmonella species NOT DETECTED NOT DETECTED Final   Serratia marcescens NOT DETECTED NOT DETECTED Final   Haemophilus influenzae NOT DETECTED NOT DETECTED Final   Neisseria meningitidis NOT DETECTED NOT DETECTED Final   Pseudomonas aeruginosa NOT DETECTED NOT DETECTED Final   Stenotrophomonas maltophilia NOT DETECTED NOT DETECTED Final   Candida albicans NOT DETECTED NOT DETECTED Final   Candida auris NOT DETECTED NOT DETECTED Final   Candida glabrata NOT DETECTED NOT DETECTED Final   Candida krusei NOT DETECTED NOT DETECTED Final   Candida parapsilosis NOT DETECTED NOT DETECTED Final   Candida tropicalis NOT DETECTED NOT DETECTED Final   Cryptococcus  neoformans/gattii NOT DETECTED NOT DETECTED Final   CTX-M ESBL NOT DETECTED NOT DETECTED Final   Carbapenem resistance IMP NOT DETECTED NOT DETECTED Final   Carbapenem resistance KPC NOT DETECTED NOT DETECTED Final   Carbapenem resistance NDM NOT DETECTED NOT DETECTED Final   Carbapenem resist OXA 48 LIKE NOT DETECTED NOT DETECTED Final   Carbapenem resistance VIM NOT DETECTED NOT DETECTED Final    Comment: Performed at [facility], [address], [place], [place] [address]     Time coordinating discharge: 40 minutes  SIGNED:   Barb Merino, MD  [facility] 01/07/2022, 9:18 AM

## 2022-01-10 ENCOUNTER — Encounter: Payer: Medicare Other | Attending: Physician Assistant | Admitting: Physician Assistant

## 2022-01-10 DIAGNOSIS — T83511A Infection and inflammatory reaction due to indwelling urethral catheter, initial encounter: Secondary | ICD-10-CM | POA: Diagnosis not present

## 2022-01-11 NOTE — Progress Notes (Signed)
[name], [name] ([id number]) Visit Report for 01/10/2022 Chief Complaint Document Details Patient Name: [name], [name] Date of Service: 01/10/2022 8:00 AM Medical Record Number: [id number] Patient Account Number: [id number] Date of Birth/Sex: Aug 19, 1955 (66 y.o. M) Treating RN: [name] Primary Care Provider: [name] Other Clinician: Referring Provider: [name] Treating Provider/Extender: Skipper Cliche in Treatment: 7 Information Obtained from: Patient Chief Complaint Sacral, right gluteal, and bilateral heel ulcers Electronic Signature(s) Signed: 01/10/2022 4:35:29 PM By: [name] PA-C Entered By: [name] on 01/10/2022 08:07:54 Baise, [name] ([id number]) -------------------------------------------------------------------------------- Debridement Details Patient Name: [name] Date of Service: 01/10/2022 8:00 AM Medical Record Number: [id number] Patient Account Number: [id number] Date of Birth/Sex: 22-Dec-1955 (66 y.o. M) Treating RN: [name] Primary Care Provider: [name] Other Clinician: Referring Provider: [name] Treating Provider/Extender: Skipper Cliche in Treatment: 7 Debridement Performed for Wound #5 Right Calcaneus Assessment: Performed By: Physician Tommie Sams., PA-C Debridement Type: Debridement Level of Consciousness (Pre- Awake and Alert procedure): Pre-procedure Verification/Time Out Yes - 08:30 Taken: Start Time: 08:30 Total Area Debrided (L x W): 2 (cm) x 3.5 (cm) = 7 (cm) Tissue and other material Viable, Non-Viable, Slough, Subcutaneous, Slough debrided: Level: Skin/Subcutaneous Tissue Debridement Description: Excisional Instrument: Curette Bleeding: Moderate Hemostasis Achieved: Pressure End Time: 08:36 Procedural Pain: 0 Post Procedural Pain: 0 Response to Treatment: Procedure was tolerated well Level of Consciousness (Post- Awake and  Alert procedure): Post Debridement Measurements of Total Wound Length: (cm) 2 Stage: Category/Stage III Width: (cm) 3.5 Depth: (cm) 0.1 Volume: (cm) 0.55 Character of Wound/Ulcer Post Debridement: Improved Post Procedure Diagnosis Same as Pre-procedure Electronic Signature(s) Signed: 01/10/2022 4:35:29 PM By: [name] PA-C Signed: 01/11/2022 10:45:13 AM By: [name] RN Entered By: [name] on 01/10/2022 08:34:05 [name], [name] ([id number]) -------------------------------------------------------------------------------- Debridement Details Patient Name: [name] Date of Service: 01/10/2022 8:00 AM Medical Record Number: [id number] Patient Account Number: [id number] Date of Birth/Sex: 12/24/1955 (66 y.o. M) Treating RN: [name] Primary Care Provider: [name] Other Clinician: Referring Provider: [name] Treating Provider/Extender: Skipper Cliche in Treatment: 7 Debridement Performed for Wound #6 Left Calcaneus Assessment: Performed By: Physician Tommie Sams., PA-C Debridement Type: Debridement Level of Consciousness (Pre- Awake and Alert procedure): Pre-procedure Verification/Time Out Yes - 08:30 Taken: Start Time: 08:30 Total Area Debrided (L x W): 1.5 (cm) x 3.5 (cm) = 5.25 (cm) Tissue and other material Viable, Non-Viable, Slough, Subcutaneous, Slough debrided: Level: Skin/Subcutaneous Tissue Debridement Description: Excisional Instrument: Curette Bleeding: Moderate Hemostasis Achieved: Pressure End Time: 08:36 Procedural Pain: 0 Post Procedural Pain: 0 Response to Treatment: Procedure was tolerated well Level of Consciousness (Post- Awake and Alert procedure): Post Debridement Measurements of Total Wound Length: (cm) 1.5 Stage: Unstageable/Unclassified Width: (cm) 3.5 Depth: (cm) 0.1 Volume: (cm) 0.412 Character of Wound/Ulcer Post Debridement: Improved Post Procedure Diagnosis Same as  Pre-procedure Electronic Signature(s) Signed: 01/10/2022 4:35:29 PM By: [name] PA-C Signed: 01/11/2022 10:45:13 AM By: [name] RN Entered By: [name] on 01/10/2022 08:35:00 [name], [name] ([id number]) -------------------------------------------------------------------------------- HPI Details Patient Name: [name] Date of Service: 01/10/2022 8:00 AM Medical Record Number: [id number] Patient Account Number: [id number] Date of Birth/Sex: April 11, 1956 (66 y.o. M) Treating RN: [name] Primary Care Provider: [name] Other Clinician: Referring Provider: [name] Treating Provider/Extender: Skipper Cliche in Treatment: 7 History of Present Illness HPI Description: 05/29/2021 this is a patient who presents today for initial inspection here in the clinic concerning wounds that he has over the right heel and the  sacral region. Unfortunately the sacral wound is starting to spread off to the right gluteal location due to how he sits always leaning towards the right side in his chair. His wife is present she is the primary caregiver though she is not home with him all the time she does have to work. She does do an excellent job however it appears to be in trying to keep things under good control for him. The patient is not able to change positions himself nor walk by himself so he is pretty much at the mercy of the position he is putting when she is gone and this tends to be his chair which she sits and most of the day. Obviously this I think is the main culprit for what is going on currently. It was actually in January 2020 when the sacral wound started. It was in September 2022 when the wound started to spread more to the right gluteal location. Subsequently in August 2022 is when he had been in a skilled nursing facility and the heel started to give him trouble as well. That does not seem to be doing nearly as poorly as the sacral region. He  was hospitalized in October 2022 secondary to sepsis and this was in regard to the foot and was sent to skilled nursing again he is now back at home. He did have a wound VAC for the sacral wound over the summer 2022 but being in and out of facilities this ended up getting sent back. The patient does have [facility] home health that comes out 1 time per week to help with care. His most recent hemoglobin A1c was 6.9 in August 2022. Patient's met past medical history includes generalized muscle weakness, bipolar disorder, diabetes mellitus type 2, hypertension, long-term use of anticoagulant therapy due to frequent blood clots/DVTs. He also has a history of traumatic brain injury. 07/24/2021 upon evaluation today patient appears to be doing decently well in regard to the pressure ulcer on the right heel as well as the sacral region. In general I think you are making some progress here which is great news. Overall the heel unfortunately had already closed previously when we saw him although it apparently reopened when he was working with physical therapy according to his wife. The area in the sacral region is doing well and looks clean there is still some depth here but I still think it would be difficult to wound VAC this region. His wife does an awesome job taking care of him. Is been so long since we have seen him because he has been in the hospital to be honest. 08/07/2021 upon evaluation today patient appears to be doing well at this time. Fortunately I do not see any signs of active infection locally or systemically at this time which is great news. No fevers, chills, nausea, vomiting, or diarrhea. Unfortunately after I saw him on the 24th he actually ended up in the hospital in the 27th due to being septic. This was not due to the wounds but after looking at records actually due to a UTI. Fortunately he is doing much better and very happy in that regard. I do not see any signs of infection locally nor  systemically at this time. 08/21/2021 upon evaluation today patient appears to be doing well with regard to his wound. Fortunately I think that the sacral region is doing decently well at this point which is great news. With regard to the foot this also does have some slough  noted but I feel like we are making progress here. He does have some irritation around the right upper thigh/gluteal region. I feel like it is more towards the thigh. Nonetheless this does appear to be pressure related he spends a lot of time sitting up his caregiver states he really will not get in the bed and stay there like he should. 09/04/2021 upon evaluation patient appears to be doing decently well today in regard to the wound on his heel as well as the sacral area. I am actually very pleased with both and how things are appearing currently. There does not appear to be any evidence of active infection I think that his caregiver is doing an awesome job with regard to the wound care here. She is present today as well and I did discuss this with her. 09/25/2021 upon evaluation today patient appears to be doing well with regard to his wound on the sacral region. His right heel is also doing well. Unfortunately he has a new area on his left heel which does appear to be pressure injury. I do not see any evidence of active infection locally or systemically which is great news no fevers, chills, nausea, vomiting, or diarrhea. Readmission: 11-22-2021 upon evaluation today patient presents for reevaluation here in the clinic concerning issues with his wounds. Since have last seen him he was in the hospital and then subsequently ended up in a skilled nursing facility. Subsequently period of time in the facility he actually had a breakdown in the right thigh location which has been an issue for him. Fortunately I do not see any evidence of active infection locally or systemically which is great news and I am very pleased in that regard.  Nonetheless he does have still the wounds on both heels as well as the wound in the sacral area and the new area in the right upper thigh/gluteal region. 12-13-2021 upon evaluation today patient appears to be doing well currently in regard to his wounds. The left heel is going to require some sharp debridement. Fortunately the right heel seems to be doing quite well. Overall I think his gluteal region as well as sacral region also are doing well. 01-10-2022 upon evaluation today patient appears to be doing better in regard to some areas unfortunately his sacral area is not doing as well. It seems of gotten worse his caregiver states when he was in the hospital recently they really did not offloading. Fortunately I do not see any evidence of active infection locally or systemically at this time. Electronic Signature(s) Signed: 01/10/2022 9:29:47 AM By: [name] PA-C Entered By: [name] on 01/10/2022 09:29:47 Klausner, [name] ([id number]) -------------------------------------------------------------------------------- Physical Exam Details Patient Name: [name] Date of Service: 01/10/2022 8:00 AM Medical Record Number: [id number] Patient Account Number: [id number] Date of Birth/Sex: 11-Jul-1955 (65 y.o. M) Treating RN: [name] Primary Care Provider: [name] Other Clinician: Referring Provider: [name] Treating Provider/Extender: Skipper Cliche in Treatment: 7 Constitutional patient is hypertensive.. pulse regular and within target range for patient.Marland Kitchen respirations regular, non-labored and within target range for patient.Marland Kitchen temperature within target range for patient.. Well-nourished and well-hydrated in no acute distress. Respiratory normal breathing without difficulty. Psychiatric this patient is able to make decisions and demonstrates good insight into disease process. Alert and Oriented x 3. pleasant and cooperative. Notes Upon  inspection patient's wound bed actually showed signs of the need for sharp debridement in regard to both heels I did perform debridement in this area.  With regard to the sacral region there was no debridement necessary but the patient does show some signs of deterioration compared to what I last saw and again not being offloaded appropriately definitely could contribute to this. We will get a continue with the current treatment with Dakin's although I may switch to packing with Hydrofera Blue depending on how things look at the next visit. Electronic Signature(s) Signed: 01/10/2022 9:30:31 AM By: [name] PA-C Entered By: [name] on 01/10/2022 09:30:31 Skelley, [name] ([id number]) -------------------------------------------------------------------------------- Physician Orders Details Patient Name: [name] Date of Service: 01/10/2022 8:00 AM Medical Record Number: [id number] Patient Account Number: [id number] Date of Birth/Sex: 01-25-1956 (65 y.o. M) Treating RN: [name] Primary Care Provider: [name] Other Clinician: Referring Provider: [name] Treating Provider/Extender: Skipper Cliche in Treatment: 7 Verbal / Phone Orders: No Diagnosis Coding ICD-10 Coding Code Description L89.613 Pressure ulcer of right heel, stage 3 L89.623 Pressure ulcer of left heel, stage 3 L89.154 Pressure ulcer of sacral region, stage 4 L24.A0 Irritant contact dermatitis due to friction or contact with body fluids, unspecified L98.412 Non-pressure chronic ulcer of buttock with fat layer exposed M62.81 Muscle weakness (generalized) F31.9 Bipolar disorder, unspecified E11.622 Type 2 diabetes mellitus with other skin ulcer I10 Essential (primary) hypertension Z79.01 Long term (current) use of anticoagulants 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 Follow-up Appointments o Return Appointment in 3 weeks. [facility] for wound care. May utilize formulary equivalent dressing for wound treatment orders unless otherwise specified. Home Health Nurse may visit [facility] to address patientos wound care needs. Kathaleen Bury phone [phone] fax [phone] o Scheduled days for dressing changes to be completed; exception, patient has scheduled wound care visit that day. o **Please direct any NON-WOUND related issues/requests for orders to patient's Primary Care Physician. **If current dressing causes regression in wound condition, may D/C ordered dressing product/s and apply Normal Saline Moist Dressing daily until next [place] or Other MD appointment. **Notify [facility] of regression in wound condition at [phone]. Bathing/ Shower/ Hygiene o May shower with wound dressing protected with water repellent cover or cast protector. o No tub bath. Anesthetic (Use 'Patient Medications' Section for Anesthetic Order Entry) o Lidocaine applied to wound bed Edema Control - Lymphedema / Segmental Compressive Device / Other o Elevate, Exercise Daily and Avoid Standing for Long Periods of Time. o Elevate legs to the level of the heart and pump ankles as often as possible o Elevate leg(s) parallel to the floor when sitting. o DO YOUR BEST to sleep in the bed at night. DO NOT sleep in your recliner. Long hours of sitting in a recliner leads to swelling of the legs and/or potential wounds on your backside. Off-Loading o Gel wheelchair cushion o Low air-loss mattress (Group 2) o Turn and reposition every 2 hours - keep pressure off of the sacrum and heels wounds o Other: - PRAFO boot in bed keep pressure off of sacrum/gluteus and heels- Additional Orders / Instructions o Follow Nutritious Diet and Increase Protein Intake Wound Treatment Wound #5 - Calcaneus Wound Laterality: Right Bagshaw, Robel ([id number]) Primary Dressing: Hydrofera Blue Ready Transfer  Foam, 2.5x2.5 (in/in) 3 x Per Week/30 Days Discharge Instructions: Apply Hydrofera Blue Ready to wound bed as directed Secondary Dressing: (SILCONE BORDER) Zetuvit Plus SILICONE BORDER Dressing 5x5 (in/in) 3 x Per Week/30 Days Discharge Instructions: Please do not put silicone bordered dressings under wraps. Use non-bordered dressing only. Wound #6 - Calcaneus  Wound Laterality: Left Primary Dressing: Hydrofera Blue Ready Transfer Foam, 2.5x2.5 (in/in) 1 x Per Day/30 Days Discharge Instructions: Apply Hydrofera Blue Ready to wound bed as directed Secondary Dressing: (SILICONE BORDER) Zetuvit Plus SILICONE BORDER Dressing 4x4 (in/in) 1 x Per Day/30 Days Discharge Instructions: Please do not put silicone bordered dressings under wraps. Use non-bordered dressing only. Wound #7 - Sacrum Topical: calmoseptine 1 x Per Day/30 Days Discharge Instructions: apply to excorated areas Electronic Signature(s) Signed: 01/10/2022 4:35:29 PM By: [name] PA-C Signed: 01/11/2022 10:45:13 AM By: [name] RN Entered By: [name] on 01/10/2022 08:35:56 Piloto, [name] ([id number]) -------------------------------------------------------------------------------- Problem List Details Patient Name: [name] Date of Service: 01/10/2022 8:00 AM Medical Record Number: [id number] Patient Account Number: [id number] Date of Birth/Sex: 02/01/1956 (65 y.o. M) Treating RN: [name] Primary Care Provider: [name] Other Clinician: Referring Provider: [name] Treating Provider/Extender: Skipper Cliche in Treatment: 7 Active Problems ICD-10 Encounter Code Description Active Date MDM Diagnosis L89.613 Pressure ulcer of right heel, stage 3 11/22/2021 No Yes L89.623 Pressure ulcer of left heel, stage 3 11/22/2021 No Yes L89.154 Pressure ulcer of sacral region, stage 4 11/22/2021 No Yes L24.A0 Irritant contact dermatitis due to friction or contact with body fluids,  11/22/2021 No Yes unspecified L98.412 Non-pressure chronic ulcer of buttock with fat layer exposed 11/22/2021 No Yes M62.81 Muscle weakness (generalized) 11/22/2021 No Yes F31.9 Bipolar disorder, unspecified 11/22/2021 No Yes E11.622 Type 2 diabetes mellitus with other skin ulcer 11/22/2021 No Yes I10 Essential (primary) hypertension 11/22/2021 No Yes Z79.01 Long term (current) use of anticoagulants 11/22/2021 No Yes 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 11/22/2021 No Yes Inactive Problems Resolved Problems KYRIE, FLUDD ([id number]) Electronic Signature(s) Signed: 01/10/2022 4:35:29 PM By: [name] PA-C Entered By: [name] on 01/10/2022 08:07:44 Grondahl, [name] ([id number]) -------------------------------------------------------------------------------- Progress Note Details Patient Name: [name] Date of Service: 01/10/2022 8:00 AM Medical Record Number: [id number] Patient Account Number: [id number] Date of Birth/Sex: 22-Sep-1955 (65 y.o. M) Treating RN: [name] Primary Care Provider: [name] Other Clinician: Referring Provider: [name] Treating Provider/Extender: Skipper Cliche in Treatment: 7 Subjective Chief Complaint Information obtained from Patient Sacral, right gluteal, and bilateral heel ulcers History of Present Illness (HPI) 05/29/2021 this is a patient who presents today for initial inspection here in the clinic concerning wounds that he has over the right heel and the sacral region. Unfortunately the sacral wound is starting to spread off to the right gluteal location due to how he sits always leaning towards the right side in his chair. His wife is present she is the primary caregiver though she is not home with him all the time she does have to work. She does do an excellent job however it appears to be in trying to keep things under good control for him. The patient is not able to change  positions himself nor walk by himself so he is pretty much at the mercy of the position he is putting when she is gone and this tends to be his chair which she sits and most of the day. Obviously this I think is the main culprit for what is going on currently. It was actually in January 2020 when the sacral wound started. It was in September 2022 when the wound started to spread more to the right gluteal location. Subsequently in August 2022 is when he had been in a skilled nursing facility and the heel started to give him trouble as well. That does not  seem to be doing nearly as poorly as the sacral region. He was hospitalized in October 2022 secondary to sepsis and this was in regard to the foot and was sent to skilled nursing again he is now back at home. He did have a wound VAC for the sacral wound over the summer 2022 but being in and out of facilities this ended up getting sent back. The patient does have [facility] home health that comes out 1 time per week to help with care. His most recent hemoglobin A1c was 6.9 in August 2022. Patient's met past medical history includes generalized muscle weakness, bipolar disorder, diabetes mellitus type 2, hypertension, long-term use of anticoagulant therapy due to frequent blood clots/DVTs. He also has a history of traumatic brain injury. 07/24/2021 upon evaluation today patient appears to be doing decently well in regard to the pressure ulcer on the right heel as well as the sacral region. In general I think you are making some progress here which is great news. Overall the heel unfortunately had already closed previously when we saw him although it apparently reopened when he was working with physical therapy according to his wife. The area in the sacral region is doing well and looks clean there is still some depth here but I still think it would be difficult to wound VAC this region. His wife does an awesome job taking care of him. Is been so long since  we have seen him because he has been in the hospital to be honest. 08/07/2021 upon evaluation today patient appears to be doing well at this time. Fortunately I do not see any signs of active infection locally or systemically at this time which is great news. No fevers, chills, nausea, vomiting, or diarrhea. Unfortunately after I saw him on the 24th he actually ended up in the hospital in the 27th due to being septic. This was not due to the wounds but after looking at records actually due to a UTI. Fortunately he is doing much better and very happy in that regard. I do not see any signs of infection locally nor systemically at this time. 08/21/2021 upon evaluation today patient appears to be doing well with regard to his wound. Fortunately I think that the sacral region is doing decently well at this point which is great news. With regard to the foot this also does have some slough noted but I feel like we are making progress here. He does have some irritation around the right upper thigh/gluteal region. I feel like it is more towards the thigh. Nonetheless this does appear to be pressure related he spends a lot of time sitting up his caregiver states he really will not get in the bed and stay there like he should. 09/04/2021 upon evaluation patient appears to be doing decently well today in regard to the wound on his heel as well as the sacral area. I am actually very pleased with both and how things are appearing currently. There does not appear to be any evidence of active infection I think that his caregiver is doing an awesome job with regard to the wound care here. She is present today as well and I did discuss this with her. 09/25/2021 upon evaluation today patient appears to be doing well with regard to his wound on the sacral region. His right heel is also doing well. Unfortunately he has a new area on his left heel which does appear to be pressure injury. I do not see any evidence  of active  infection locally or systemically which is great news no fevers, chills, nausea, vomiting, or diarrhea. Readmission: 11-22-2021 upon evaluation today patient presents for reevaluation here in the clinic concerning issues with his wounds. Since have last seen him he was in the hospital and then subsequently ended up in a skilled nursing facility. Subsequently period of time in the facility he actually had a breakdown in the right thigh location which has been an issue for him. Fortunately I do not see any evidence of active infection locally or systemically which is great news and I am very pleased in that regard. Nonetheless he does have still the wounds on both heels as well as the wound in the sacral area and the new area in the right upper thigh/gluteal region. 12-13-2021 upon evaluation today patient appears to be doing well currently in regard to his wounds. The left heel is going to require some sharp debridement. Fortunately the right heel seems to be doing quite well. Overall I think his gluteal region as well as sacral region also are doing well. 01-10-2022 upon evaluation today patient appears to be doing better in regard to some areas unfortunately his sacral area is not doing as well. It seems of gotten worse his caregiver states when he was in the hospital recently they really did not offloading. Fortunately I do not see any evidence of active infection locally or systemically at this time. KAIZEN, IBSEN ([id number]) Objective Constitutional patient is hypertensive.. pulse regular and within target range for patient.Marland Kitchen respirations regular, non-labored and within target range for patient.Marland Kitchen temperature within target range for patient.. Well-nourished and well-hydrated in no acute distress. Vitals Time Taken: 8:16 AM, Height: 66 in, Weight: 279 lbs, BMI: 45, Temperature: 98.4 F, Pulse: 83 bpm, Respiratory Rate: 18 breaths/min, Blood Pressure: 177/95 mmHg. Respiratory normal  breathing without difficulty. Psychiatric this patient is able to make decisions and demonstrates good insight into disease process. Alert and Oriented x 3. pleasant and cooperative. General Notes: Upon inspection patient's wound bed actually showed signs of the need for sharp debridement in regard to both heels I did perform debridement in this area. With regard to the sacral region there was no debridement necessary but the patient does show some signs of deterioration compared to what I last saw and again not being offloaded appropriately definitely could contribute to this. We will get a continue with the current treatment with Dakin's although I may switch to packing with Hydrofera Blue depending on how things look at the next visit. Integumentary (Hair, Skin) Wound #5 status is Open. Original cause of wound was Pressure Injury. The date acquired was: 07/01/2021. The wound has been in treatment 7 weeks. The wound is located on the Right Calcaneus. The wound measures 2cm length x 3.5cm width x 0.1cm depth; 5.498cm^2 area and 0.55cm^3 volume. There is Fat Layer (Subcutaneous Tissue) exposed. There is no tunneling or undermining noted. There is a medium amount of serosanguineous drainage noted. There is large (67-100%) red, pink granulation within the wound bed. There is a small (1-33%) amount of necrotic tissue within the wound bed including Adherent Slough. Wound #6 status is Open. Original cause of wound was Pressure Injury. The date acquired was: 07/01/2021. The wound has been in treatment 7 weeks. The wound is located on the Left Calcaneus. The wound measures 1.5cm length x 3.5cm width x 0.1cm depth; 4.123cm^2 area and 0.412cm^3 volume. There is no tunneling or undermining noted. There is a small amount of serosanguineous drainage noted.  There is no granulation within the wound bed. There is a large (67-100%) amount of necrotic tissue within the wound bed including Eschar. Wound #7 status is  Open. Original cause of wound was Pressure Injury. The date acquired was: 07/01/2021. The wound has been in treatment 7 weeks. The wound is located on the Sacrum. The wound measures 6cm length x 2cm width x 3.7cm depth; 9.425cm^2 area and 34.872cm^3 volume. There is Fat Layer (Subcutaneous Tissue) exposed. There is no tunneling or undermining noted. There is a medium amount of serosanguineous drainage noted. There is large (67-100%) red granulation within the wound bed. There is a small (1-33%) amount of necrotic tissue within the wound bed including Adherent Slough. Wound #8 status is Healed - Epithelialized. Original cause of wound was Gradually Appeared. The date acquired was: 10/29/2021. The wound has been in treatment 7 weeks. The wound is located on the Right Gluteus. The wound measures 0cm length x 0cm width x 0cm depth; 0cm^2 area and 0cm^3 volume. There is no tunneling or undermining noted. There is a none present amount of drainage noted. There is no granulation within the wound bed. There is no necrotic tissue within the wound bed. Assessment Active Problems ICD-10 Pressure ulcer of right heel, stage 3 Pressure ulcer of left heel, stage 3 Pressure ulcer of sacral region, stage 4 Irritant contact dermatitis due to friction or contact with body fluids, unspecified Non-pressure chronic ulcer of buttock with fat layer exposed Muscle weakness (generalized) Bipolar disorder, unspecified Type 2 diabetes mellitus with other skin ulcer Essential (primary) hypertension Long term (current) use of anticoagulants Personal history of traumatic brain injury Atteberry, Traci ([id number]) Procedures Wound #5 Pre-procedure diagnosis of Wound #5 is a Pressure Ulcer located on the Right Calcaneus . There was a Excisional Skin/Subcutaneous Tissue Debridement with a total area of 7 sq cm performed by Tommie Sams., PA-C. With the following instrument(s): Curette to remove Viable and Non-Viable  tissue/material. Material removed includes Subcutaneous Tissue and Slough and. No specimens were taken. A time out was conducted at 08:30, prior to the start of the procedure. A Moderate amount of bleeding was controlled with Pressure. The procedure was tolerated well with a pain level of 0 throughout and a pain level of 0 following the procedure. Post Debridement Measurements: 2cm length x 3.5cm width x 0.1cm depth; 0.55cm^3 volume. Post debridement Stage noted as Category/Stage III. Character of Wound/Ulcer Post Debridement is improved. Post procedure Diagnosis Wound #5: Same as Pre-Procedure Wound #6 Pre-procedure diagnosis of Wound #6 is a Pressure Ulcer located on the Left Calcaneus . There was a Excisional Skin/Subcutaneous Tissue Debridement with a total area of 5.25 sq cm performed by Tommie Sams., PA-C. With the following instrument(s): Curette to remove Viable and Non-Viable tissue/material. Material removed includes Subcutaneous Tissue and Slough and. No specimens were taken. A time out was conducted at 08:30, prior to the start of the procedure. A Moderate amount of bleeding was controlled with Pressure. The procedure was tolerated well with a pain level of 0 throughout and a pain level of 0 following the procedure. Post Debridement Measurements: 1.5cm length x 3.5cm width x 0.1cm depth; 0.412cm^3 volume. Post debridement Stage noted as Unstageable/Unclassified. Character of Wound/Ulcer Post Debridement is improved. Post procedure Diagnosis Wound #6: Same as Pre-Procedure Plan Follow-up Appointments: Return Appointment in 3 weeks. Home Health: [facility] for wound care. May utilize formulary equivalent dressing for wound treatment orders unless otherwise specified. Home Health Nurse may visit PRN to address  patient s wound care needs. Kathaleen Bury phone [phone] fax [phone] Scheduled days for dressing changes to be completed; exception, patient has scheduled  wound care visit that day. **Please direct any NON-WOUND related issues/requests for orders to patient's Primary Care Physician. **If current dressing causes regression in wound condition, may D/C ordered dressing product/s and apply Normal Saline Moist Dressing daily until next [facility] or Other MD appointment. **Notify [facility] of regression in wound condition at [phone]. Bathing/ Shower/ Hygiene: May shower with wound dressing protected with water repellent cover or cast protector. No tub bath. Anesthetic (Use 'Patient Medications' Section for Anesthetic Order Entry): Lidocaine applied to wound bed Edema Control - Lymphedema / Segmental Compressive Device / Other: Elevate, Exercise Daily and Avoid Standing for Long Periods of Time. Elevate legs to the level of the heart and pump ankles as often as possible Elevate leg(s) parallel to the floor when sitting. DO YOUR BEST to sleep in the bed at night. DO NOT sleep in your recliner. Long hours of sitting in a recliner leads to swelling of the legs and/or potential wounds on your backside. Off-Loading: Gel wheelchair cushion Low air-loss mattress (Group 2) Turn and reposition every 2 hours - keep pressure off of the sacrum and heels wounds Other: - PRAFO boot in bed keep pressure off of sacrum/gluteus and heels- Additional Orders / Instructions: Follow Nutritious Diet and Increase Protein Intake WOUND #5: - Calcaneus Wound Laterality: Right Primary Dressing: Hydrofera Blue Ready Transfer Foam, 2.5x2.5 (in/in) 3 x Per Week/30 Days Discharge Instructions: Apply Hydrofera Blue Ready to wound bed as directed Secondary Dressing: (SILCONE BORDER) Zetuvit Plus SILICONE BORDER Dressing 5x5 (in/in) 3 x Per Week/30 Days Discharge Instructions: Please do not put silicone bordered dressings under wraps. Use non-bordered dressing only. WOUND #6: - Calcaneus Wound Laterality: Left Primary Dressing: Hydrofera Blue Ready  Transfer Foam, 2.5x2.5 (in/in) 1 x Per Day/30 Days Discharge Instructions: Apply Hydrofera Blue Ready to wound bed as directed Secondary Dressing: (SILICONE BORDER) Zetuvit Plus SILICONE BORDER Dressing 4x4 (in/in) 1 x Per Day/30 Days Discharge Instructions: Please do not put silicone bordered dressings under wraps. Use non-bordered dressing only. WOUND #7: - Sacrum Wound Laterality: Topical: calmoseptine 1 x Per Day/30 Days Discharge Instructions: apply to excorated areas 1. I would recommend currently that we continue with the Dakin's moistened gauze dressing for the time being. I do think the Hydrofera Blue could be an option for packing although I do not think were quite there yet I think that a lot of deterioration is secondary to him not being offloaded appropriately when he was in the hospital. With that being said he was looking well with the Dakin's packing earlier my hope is that we ZAKARY, KIMURA ([id number]) will continue to show signs of improvement will get back on track. 2. With regard to the heels will use Hydrofera Blue bilaterally. 3. If the sacral wound does not look better by the next visit I would recommend Hydrofera Blue cut into a spiral and packed into this area as well which I think could do well for him. We will see patient back for reevaluation in 2 weeks here in the clinic. If anything worsens or changes patient will contact our office for additional recommendations. Electronic Signature(s) Signed: 01/10/2022 9:31:22 AM By: [name] PA-C Entered By: [name] on 01/10/2022 09:31:22 Calise, [name] ([id number]) -------------------------------------------------------------------------------- SuperBill Details Patient Name: [name] Date of Service: 01/10/2022 Medical Record Number: [id number] Patient Account Number: [id number] Date of  Birth/Sex: [birth year] (66 y.o. M) Treating RN: [name] Primary Care Provider:  [name] Other Clinician: Referring Provider: [name] Treating Provider/Extender: Skipper Cliche in Treatment: 7 Diagnosis Coding ICD-10 Codes Code Description [phone] Pressure ulcer of right heel, stage 3 L89.623 Pressure ulcer of left heel, stage 3 L89.154 Pressure ulcer of sacral region, stage 4 L24.A0 Irritant contact dermatitis due to friction or contact with body fluids, unspecified L98.412 Non-pressure chronic ulcer of buttock with fat layer exposed M62.81 Muscle weakness (generalized) F31.9 Bipolar disorder, unspecified E11.622 Type 2 diabetes mellitus with other skin ulcer I10 Essential (primary) hypertension Z79.01 Long term (current) use of anticoagulants 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 Facility Procedures CPT4 Code: [id number] Description: 11042 - DEB SUBQ TISSUE 20 SQ CM/< Modifier: Quantity: 1 CPT4 Code: Description: ICD-10 Diagnosis Description L89.613 Pressure ulcer of right heel, stage 3 L89.623 Pressure ulcer of left heel, stage 3 Modifier: Quantity: Physician Procedures CPT4 Code: [id number] Description: 11042 - WC PHYS SUBQ TISS 20 SQ CM Modifier: Quantity: 1 CPT4 Code: Description: ICD-10 Diagnosis Description L89.613 Pressure ulcer of right heel, stage 3 L89.623 Pressure ulcer of left heel, stage 3 Modifier: Quantity: Electronic Signature(s) Signed: 01/10/2022 9:31:45 AM By: [name] PA-C Entered By: [name] on 01/10/2022 09:31:45

## 2022-01-11 NOTE — Progress Notes (Signed)
[name], [name] ([id number]) Visit Report for 01/10/2022 Arrival Information Details Patient Name: [name], [name] Date of Service: 01/10/2022 8:00 AM Medical Record Number: [id number] Patient Account Number: [id number] Date of Birth/Sex: 07/27/1955 (65 y.o. M) Treating RN: [name] Primary Care Curtiss Mahmood: [name] Other Clinician: Referring Pricila Bridge: [name] Treating Kacey Vicuna/Extender: Skipper Cliche in Treatment: 7 Visit Information History Since Last Visit All ordered tests and consults were completed: No Patient Arrived: Wheel Chair Added or deleted any medications: No Arrival Time: 08:08 Any new allergies or adverse reactions: No Accompanied By: wife Had a fall or experienced change in No Transfer Assistance: None activities of daily living that may affect Patient Identification Verified: Yes risk of falls: Secondary Verification Process Completed: Yes Signs or symptoms of abuse/neglect since last visito No Patient Requires Transmission-Based No Hospitalized since last visit: No Precautions: Implantable device outside of the clinic excluding No Patient Has Alerts: Yes cellular tissue based products placed in the center Patient Alerts: Patient on Blood since last visit: Thinner Has Dressing in Place as Prescribed: Yes Pain Present Now: No Electronic Signature(s) Signed: 01/11/2022 10:45:13 AM By: [name] RN Entered By: [name] on 01/10/2022 08:15:59 Rinke, [name] ([id number]) -------------------------------------------------------------------------------- Clinic Level of Care Assessment Details Patient Name: [name] Date of Service: 01/10/2022 8:00 AM Medical Record Number: [id number] Patient Account Number: [id number] Date of Birth/Sex: January 28, 1956 (65 y.o. M) Treating RN: [name] Primary Care Kari Kerth: [name] Other Clinician: Referring Cristianna Cyr: [name] Treating  Vicke Plotner/Extender: Skipper Cliche in Treatment: 7 Clinic Level of Care Assessment Items TOOL 1 Quantity Score []  - Use when EandM and Procedure is performed on INITIAL visit 0 ASSESSMENTS - Nursing Assessment / Reassessment []  - General Physical Exam (combine w/ comprehensive assessment (listed just below) when performed on new 0 pt. evals) []  - 0 Comprehensive Assessment (HX, ROS, Risk Assessments, Wounds Hx, etc.) ASSESSMENTS - Wound and Skin Assessment / Reassessment []  - Dermatologic / Skin Assessment (not related to wound area) 0 ASSESSMENTS - Ostomy and/or Continence Assessment and Care []  - Incontinence Assessment and Management 0 []  - 0 Ostomy Care Assessment and Management (repouching, etc.) PROCESS - Coordination of Care []  - Simple Patient / Family Education for ongoing care 0 []  - 0 Complex (extensive) Patient / Family Education for ongoing care []  - 0 Staff obtains Programmer, systems, Records, Test Results / Process Orders []  - 0 Staff telephones HHA, Nursing Homes / Clarify orders / etc []  - 0 Routine Transfer to another Facility (non-emergent condition) []  - 0 Routine Hospital Admission (non-emergent condition) []  - 0 New Admissions / Biomedical engineer / Ordering NPWT, Apligraf, etc. []  - 0 Emergency Hospital Admission (emergent condition) PROCESS - Special Needs []  - Pediatric / Minor Patient Management 0 []  - 0 Isolation Patient Management []  - 0 Hearing / Language / Visual special needs []  - 0 Assessment of Community assistance (transportation, D/C planning, etc.) []  - 0 Additional assistance / Altered mentation []  - 0 Support Surface(s) Assessment (bed, cushion, seat, etc.) INTERVENTIONS - Miscellaneous []  - External ear exam 0 []  - 0 Patient Transfer (multiple staff / Civil Service fast streamer / Similar devices) []  - 0 Simple Staple / Suture removal (25 or less) []  - 0 Complex Staple / Suture removal (26 or more) []  - 0 Hypo/Hyperglycemic Management (do not  check if billed separately) []  - 0 Ankle / Brachial Index (ABI) - do not check if billed separately Has the patient been seen at the hospital within the last three years: Yes Total  Score: 0 Level Of Care: ____ [name] ([id number]) Electronic Signature(s) Signed: 01/11/2022 10:45:13 AM By: [name] RN Entered By: [name] on 01/10/2022 08:36:07 Kole, [name] ([id number]) -------------------------------------------------------------------------------- Encounter Discharge Information Details Patient Name: [name] Date of Service: 01/10/2022 8:00 AM Medical Record Number: [id number] Patient Account Number: [id number] Date of Birth/Sex: 07/16/1955 (65 y.o. M) Treating RN: [name] Primary Care Melissaann Dizdarevic: Dione Booze Other Clinician: Referring Amyjo Mizrachi: Dione Booze Treating Haeley Fordham/Extender: Rowan Blase in Treatment: 7 Encounter Discharge Information Items Post Procedure Vitals Discharge Condition: Stable Temperature (F): 98.4 Ambulatory Status: Wheelchair Pulse (bpm): 83 Discharge Destination: Home Respiratory Rate (breaths/min): 18 Transportation: Private Auto Blood Pressure (mmHg): 177/95 Accompanied By: wife Schedule Follow-up Appointment: Yes Clinical Summary of Care: Electronic Signature(s) Signed: 01/11/2022 10:45:13 AM By: [name] RN Entered By: [name] on 01/10/2022 08:37:45 Mcquerry, [name] ([id number]) -------------------------------------------------------------------------------- Lower Extremity Assessment Details Patient Name: [name] Date of Service: 01/10/2022 8:00 AM Medical Record Number: [id number] Patient Account Number: [id number] Date of Birth/Sex: 29-Oct-1955 (65 y.o. M) Treating RN: [name] Primary Care Weslie Pretlow: Dione Booze Other Clinician: Referring Vernon Maish: Dione Booze Treating Eileene Kisling/Extender: Allen Derry Weeks in Treatment: 7 Vascular  Assessment Pulses: Dorsalis Pedis Palpable: [Left:Yes] [Right:Yes] Electronic Signature(s) Signed: 01/11/2022 10:45:13 AM By: [name] RN Entered By: [name] on 01/10/2022 08:25:31 Buehl, [name] ([id number]) -------------------------------------------------------------------------------- Multi Wound Chart Details Patient Name: [name] Date of Service: 01/10/2022 8:00 AM Medical Record Number: [id number] Patient Account Number: [id number] Date of Birth/Sex: 05/16/1956 (65 y.o. M) Treating RN: [name] Primary Care Chaia Ikard: Dione Booze Other Clinician: Referring Traveion Ruddock: Dione Booze Treating Tian Davison/Extender: Rowan Blase in Treatment: 7 Vital Signs Height(in): 66 Pulse(bpm): 83 Weight(lbs): 279 Blood Pressure(mmHg): 177/95 Body Mass Index(BMI): 45 Temperature(F): 98.4 Respiratory Rate(breaths/min): 18 Photos: Wound Location: Right Calcaneus Left Calcaneus Sacrum Wounding Event: Pressure Injury Pressure Injury Pressure Injury Primary Etiology: Pressure Ulcer Pressure Ulcer Pressure Ulcer Comorbid History: Anemia, Sleep Apnea, Coronary Anemia, Sleep Apnea, Coronary Anemia, Sleep Apnea, Coronary Artery Disease, Deep Vein Artery Disease, Deep Vein Artery Disease, Deep Vein Thrombosis, Hypertension, Type II Thrombosis, Hypertension, Type II Thrombosis, Hypertension, Type II Diabetes, History of pressure Diabetes, History of pressure Diabetes, History of pressure wounds, Neuropathy wounds, Neuropathy wounds, Neuropathy Date Acquired: 07/01/2021 07/01/2021 07/01/2021 Weeks of Treatment: 7 7 7  Wound Status: Open Open Open Wound Recurrence: No No No Measurements L x W x D (cm) 2x3.5x0.1 1.5x3.5x0.1 6x2x3.7 Area (cm) : 5.498 4.123 9.425 Volume (cm) : 0.55 0.412 34.872 % Reduction in Area: -32.80% 40.00% -200.00% % Reduction in Volume: -32.90% 40.00% -270.00% Classification: Category/Stage III Unstageable/Unclassified Category/Stage  IV Exudate Amount: Medium Small Medium Exudate Type: Serosanguineous Serosanguineous Serosanguineous Exudate Color: red, brown red, brown red, brown Granulation Amount: Large (67-100%) None Present (0%) Large (67-100%) Granulation Quality: Red, Pink N/A Red Necrotic Amount: Small (1-33%) Large (67-100%) Small (1-33%) Necrotic Tissue: Adherent Slough Eschar Adherent Slough Exposed Structures: Fat Layer (Subcutaneous Tissue): Fascia: No Fat Layer (Subcutaneous Tissue): Yes Fat Layer (Subcutaneous Tissue): Yes Fascia: No No Fascia: No Tendon: No Tendon: No Tendon: No Muscle: No Muscle: No Muscle: No Joint: No Joint: No Joint: No Bone: No Bone: No Bone: No Epithelialization: None None None Wound Number: 8 N/A N/A Photos: N/A N/A DAMEIAN, CRISMAN ([name]) Wound Location: Right Gluteus N/A N/A Wounding Event: Gradually Appeared N/A N/A Primary Etiology: MASD N/A N/A Comorbid History: Anemia, Sleep Apnea, Coronary N/A N/A Artery Disease, Deep Vein Thrombosis, Hypertension, Type II Diabetes, History of pressure wounds, Neuropathy Date Acquired: 10/29/2021 N/A N/A 12/29/2021  of Treatment: 7 N/A N/A Wound Status: Healed - Epithelialized N/A N/A Wound Recurrence: No N/A N/A Measurements L x W x D (cm) 0x0x0 N/A N/A Area (cm) : 0 N/A N/A Volume (cm) : 0 N/A N/A % Reduction in Area: 100.00% N/A N/A % Reduction in Volume: 100.00% N/A N/A Classification: Full Thickness Without Exposed N/A N/A Support Structures Exudate Amount: None Present N/A N/A Exudate Type: N/A N/A N/A Exudate Color: N/A N/A N/A Granulation Amount: None Present (0%) N/A N/A Granulation Quality: N/A N/A N/A Necrotic Amount: None Present (0%) N/A N/A Necrotic Tissue: N/A N/A N/A Exposed Structures: Fascia: No N/A N/A Fat Layer (Subcutaneous Tissue): No Tendon: No Muscle: No Joint: No Bone: No Epithelialization: Large (67-100%) N/A N/A Treatment Notes Electronic Signature(s) Signed: 01/11/2022  10:45:13 AM By: [name] RN Entered By: [name] on 01/10/2022 08:26:03 Nims, [name] ([id number]) -------------------------------------------------------------------------------- Multi-Disciplinary Care Plan Details Patient Name: [name] Date of Service: 01/10/2022 8:00 AM Medical Record Number: [id number] Patient Account Number: [id number] Date of Birth/Sex: [birth year] (65 y.o. M) Treating RN: [name] Primary Care Zayne Marovich: Dione Booze Other Clinician: Referring Shakyia Bosso: Dione Booze Treating Rekita Miotke/Extender: Rowan Blase in Treatment: 7 Active Inactive Wound/Skin Impairment Nursing Diagnoses: Knowledge deficit related to ulceration/compromised skin integrity Goals: Patient/caregiver will verbalize understanding of skin care regimen Date Initiated: 11/22/2021 Target Resolution Date: 02/22/2022 Goal Status: Active Ulcer/skin breakdown will have a volume reduction of 30% by week 4 Date Initiated: 11/22/2021 Date Inactivated: 01/10/2022 Target Resolution Date: 12/23/2021 Goal Status: Unmet Unmet Reason: comorbities Ulcer/skin breakdown will have a volume reduction of 50% by week 8 Date Initiated: 11/22/2021 Target Resolution Date: 01/22/2022 Goal Status: Active Ulcer/skin breakdown will have a volume reduction of 80% by week 12 Date Initiated: 11/22/2021 Target Resolution Date: 02/22/2022 Goal Status: Active Ulcer/skin breakdown will heal within 14 weeks Date Initiated: 11/22/2021 Target Resolution Date: 03/25/2022 Goal Status: Active Interventions: Assess patient/caregiver ability to obtain necessary supplies Assess patient/caregiver ability to perform ulcer/skin care regimen upon admission and as needed Assess ulceration(s) every visit Notes: Electronic Signature(s) Signed: 01/11/2022 10:45:13 AM By: [name] RN Entered By: [name] on 01/10/2022 08:25:55 Rieser, [name]  ([id number]) -------------------------------------------------------------------------------- Pain Assessment Details Patient Name: [name] Date of Service: 01/10/2022 8:00 AM Medical Record Number: [id number] Patient Account Number: [id number] Date of Birth/Sex: 01-31-1956 (65 y.o. M) Treating RN: [name] Primary Care Aamilah Augenstein: Dione Booze Other Clinician: Referring Waldo Damian: Dione Booze Treating Gerica Koble/Extender: Rowan Blase in Treatment: 7 Active Problems Location of Pain Severity and Description of Pain Patient Has Paino No Site Locations Pain Management and Medication Current Pain Management: Electronic Signature(s) Signed: 01/11/2022 10:45:13 AM By: [name] RN Entered By: [name] on 01/10/2022 08:16:23 [name] ([id number]) -------------------------------------------------------------------------------- Patient/Caregiver Education Details Patient Name: [name] Date of Service: 01/10/2022 8:00 AM Medical Record Number: [id number] Patient Account Number: [id number] Date of Birth/Gender: 10-17-1955 (65 y.o. M) Treating RN: [name] Primary Care Physician: Dione Booze Other Clinician: Referring Physician: Dione Booze Treating Physician/Extender: Rowan Blase in Treatment: 7 Education Assessment Education Provided To: Patient Education Topics Provided Wound/Skin Impairment: Methods: Explain/Verbal Responses: State content correctly Electronic Signature(s) Signed: 01/11/2022 10:45:13 AM By: [name] RN Entered By: [name] on 01/10/2022 08:36:19 Kawabata, [name] ([id number]) -------------------------------------------------------------------------------- Wound Assessment Details Patient Name: [name] Date of Service: 01/10/2022 8:00 AM Medical Record Number: [id number] Patient Account Number: [id number] Date of Birth/Sex: 03/24/1956 (65 y.o. M) Treating  RN: [name] Primary Care Dashun Borre: Dione Booze Other Clinician: Referring Vanessia Bokhari: Dione Booze Treating  Davene Jobin/Extender: Jeri Cos Weeks in Treatment: 7 Wound Status Wound Number: 5 Primary Pressure Ulcer Etiology: Wound Location: Right Calcaneus Wound Open Wounding Event: Pressure Injury Status: Date Acquired: 07/01/2021 Comorbid Anemia, Sleep Apnea, Coronary Artery Disease, Deep Vein Weeks Of Treatment: 7 History: Thrombosis, Hypertension, Type II Diabetes, History of Clustered Wound: No pressure wounds, Neuropathy Photos Wound Measurements Length: (cm) 2 % Reduc Width: (cm) 3.5 % Reduc Depth: (cm) 0.1 Epithel Area: (cm) 5.498 Tunnel Volume: (cm) 0.55 Underm tion in Area: -32.8% tion in Volume: -32.9% ialization: None ing: No ining: No Wound Description Classification: Category/Stage III Foul Od Exudate Amount: Medium Slough/ Exudate Type: Serosanguineous Exudate Color: red, brown or After Cleansing: No Fibrino Yes Wound Bed Granulation Amount: Large (67-100%) Exposed Structure Granulation Quality: Red, Pink Fascia Exposed: No Necrotic Amount: Small (1-33%) Fat Layer (Subcutaneous Tissue) Exposed: Yes Necrotic Quality: Adherent Slough Tendon Exposed: No Muscle Exposed: No Joint Exposed: No Bone Exposed: No Treatment Notes Wound #5 (Calcaneus) Wound Laterality: Right Cleanser Peri-Wound Care Topical Primary Dressing Deroo, Zafar ([id number]) Hydrofera Blue Ready Transfer Foam, 2.5x2.5 (in/in) Discharge Instruction: Apply Hydrofera Blue Ready to wound bed as directed Secondary Dressing (SILCONE BORDER) Zetuvit Plus SILICONE BORDER Dressing 5x5 (in/in) Discharge Instruction: Please do not put silicone bordered dressings under wraps. Use non-bordered dressing only. Secured With Compression Wrap Compression Stockings Environmental education officer) Signed: 01/11/2022 10:45:13 AM By: [name] RN Entered By: [name] on  01/10/2022 08:24:08 EION, KOLIN ([id number]) -------------------------------------------------------------------------------- Wound Assessment Details Patient Name: [name] Date of Service: 01/10/2022 8:00 AM Medical Record Number: [id number] Patient Account Number: [id number] Date of Birth/Sex: [birth year] (65 y.o. M) Treating RN: [name] Primary Care Shandon Burlingame: [name] Other Clinician: Referring Calais Svehla: [name] Treating Aijah Lattner/Extender: Jeri Cos Weeks in Treatment: 7 Wound Status Wound Number: 6 Primary Pressure Ulcer Etiology: Wound Location: Left Calcaneus Wound Open Wounding Event: Pressure Injury Status: Date Acquired: 07/01/2021 Comorbid Anemia, Sleep Apnea, Coronary Artery Disease, Deep Vein Weeks Of Treatment: 7 History: Thrombosis, Hypertension, Type II Diabetes, History of Clustered Wound: No pressure wounds, Neuropathy Photos Wound Measurements Length: (cm) 1.5 % Reduct Width: (cm) 3.5 % Reduct Depth: (cm) 0.1 Epitheli Area: (cm) 4.123 Tunneli Volume: (cm) 0.412 Undermi ion in Area: 40% ion in Volume: 40% alization: None ng: No ning: No Wound Description Classification: Unstageable/Unclassified Foul Odo Exudate Amount: Small Slough/F Exudate Type: Serosanguineous Exudate Color: red, brown r After Cleansing: No ibrino Yes Wound Bed Granulation Amount: None Present (0%) Exposed Structure Necrotic Amount: Large (67-100%) Fascia Exposed: No Necrotic Quality: Eschar Fat Layer (Subcutaneous Tissue) Exposed: No Tendon Exposed: No Muscle Exposed: No Joint Exposed: No Bone Exposed: No Treatment Notes Wound #6 (Calcaneus) Wound Laterality: Left Cleanser Peri-Wound Care Topical Primary Dressing Talaga, Demontez ([id number]) Hydrofera Blue Ready Transfer Foam, 2.5x2.5 (in/in) Discharge Instruction: Apply Hydrofera Blue Ready to wound bed as directed Secondary Dressing (SILICONE BORDER) Zetuvit  Plus SILICONE BORDER Dressing 4x4 (in/in) Discharge Instruction: Please do not put silicone bordered dressings under wraps. Use non-bordered dressing only. Secured With Compression Wrap Compression Stockings Add-Ons Electronic Signature(s) Signed: 01/11/2022 10:45:13 AM By: [name] RN Entered By: [name] on 01/10/2022 08:24:28 DEUCE, CALVERLEY ([id number]) -------------------------------------------------------------------------------- Wound Assessment Details Patient Name: [name] Date of Service: 01/10/2022 8:00 AM Medical Record Number: [id number] Patient Account Number: [id number] Date of Birth/Sex: 10/09/1955 (65 y.o. M) Treating RN: [name] Primary Care Tracie Dore: [name] Other Clinician: Referring Ranya Fiddler: [name] Treating Brelynn Wheller/Extender: Jeri Cos Weeks in Treatment: 7 Wound Status Wound Number: 7 Primary Pressure Ulcer  Etiology: Wound Location: Sacrum Wound Open Wounding Event: Pressure Injury Status: Date Acquired: 07/01/2021 Comorbid Anemia, Sleep Apnea, Coronary Artery Disease, Deep Vein Weeks Of Treatment: 7 History: Thrombosis, Hypertension, Type II Diabetes, History of Clustered Wound: No pressure wounds, Neuropathy Photos Wound Measurements Length: (cm) 6 % Reducti Width: (cm) 2 % Reducti Depth: (cm) 3.7 Epithelia Area: (cm) 9.425 Tunnelin Volume: (cm) 34.872 Undermin on in Area: -200% on in Volume: -270% lization: None g: No ing: No Wound Description Classification: Category/Stage IV Foul Odor Exudate Amount: Medium Slough/Fi Exudate Type: Serosanguineous Exudate Color: red, brown After Cleansing: No brino Yes Wound Bed Granulation Amount: Large (67-100%) Exposed Structure Granulation Quality: Red Fascia Exposed: No Necrotic Amount: Small (1-33%) Fat Layer (Subcutaneous Tissue) Exposed: Yes Necrotic Quality: Adherent Slough Tendon Exposed: No Muscle Exposed: No Joint Exposed: No Bone  Exposed: No Treatment Notes Wound #7 (Sacrum) Cleanser Peri-Wound Care Topical calmoseptine HERSHELL, BOWMER ([id number]) Discharge Instruction: apply to excorated areas Primary Dressing Secondary Dressing Secured With Compression Wrap Compression Stockings Add-Ons Electronic Signature(s) Signed: 01/11/2022 10:45:13 AM By: [name] RN Entered By: [name] on 01/10/2022 08:24:55 Vos, [name] ([id number]) -------------------------------------------------------------------------------- Wound Assessment Details Patient Name: [name] Date of Service: 01/10/2022 8:00 AM Medical Record Number: [id number] Patient Account Number: [id number] Date of Birth/Sex: 1956-04-09 (65 y.o. M) Treating RN: [name] Primary Care Raynee Mccasland: [name] Other Clinician: Referring Bruce Churilla: [name] Treating Chelcee Korpi/Extender: Skipper Cliche in Treatment: 7 Wound Status Wound Number: 8 Primary MASD Etiology: Wound Location: Right Gluteus Wound Healed - Epithelialized Wounding Event: Gradually Appeared Status: Date Acquired: 10/29/2021 Comorbid Anemia, Sleep Apnea, Coronary Artery Disease, Deep Vein Weeks Of Treatment: 7 History: Thrombosis, Hypertension, Type II Diabetes, History of Clustered Wound: No pressure wounds, Neuropathy Photos Wound Measurements Length: (cm) 0 Width: (cm) 0 Depth: (cm) 0 Area: (cm) 0 Volume: (cm) 0 % Reduction in Area: 100% % Reduction in Volume: 100% Epithelialization: Large (67-100%) Tunneling: No Undermining: No Wound Description Classification: Full Thickness Without Exposed Support Structure Exudate Amount: None Present s Foul Odor After Cleansing: No Slough/Fibrino No Wound Bed Granulation Amount: None Present (0%) Exposed Structure Necrotic Amount: None Present (0%) Fascia Exposed: No Fat Layer (Subcutaneous Tissue) Exposed: No Tendon Exposed: No Muscle Exposed: No Joint Exposed: No Bone  Exposed: No Treatment Notes Wound #8 (Gluteus) Wound Laterality: Right Cleanser Peri-Wound Care Topical Primary Dressing AAYUSH, NAPIERALSKI ([id number]) Secondary Dressing Secured With Compression Wrap Compression Stockings Add-Ons Electronic Signature(s) Signed: 01/11/2022 10:45:13 AM By: [name] RN Entered By: [name] on 01/10/2022 08:25:21 Duffin, [name] ([id number]) -------------------------------------------------------------------------------- [place] Details Patient Name: [name] Date of Service: 01/10/2022 8:00 AM Medical Record Number: [id number] Patient Account Number: [id number] Date of Birth/Sex: [birth year] (65 y.o. M) Treating RN: [name] Primary Care Rees Matura: [name] Other Clinician: Referring Shealee Yordy: [name] Treating Link Burgeson/Extender: Skipper Cliche in Treatment: 7 Vital Signs Time Taken: 08:16 Temperature (F): 98.4 Height (in): 66 Pulse (bpm): 83 Weight (lbs): 279 Respiratory Rate (breaths/min): 18 Body Mass Index (BMI): 45 Blood Pressure (mmHg): 177/95 Reference Range: 80 - 120 mg / dl Electronic Signature(s) Signed: 01/11/2022 10:45:13 AM By: [name] RN Entered By: [name] on 01/10/2022 08:16:18

## 2022-01-12 ENCOUNTER — Emergency Department: Payer: Medicare Other

## 2022-01-12 ENCOUNTER — Encounter: Payer: Self-pay | Admitting: Emergency Medicine

## 2022-01-12 ENCOUNTER — Other Ambulatory Visit: Payer: Self-pay

## 2022-01-12 ENCOUNTER — Inpatient Hospital Stay
Admission: EM | Admit: 2022-01-12 | Discharge: 2022-01-15 | DRG: 698 | Disposition: A | Discharge status: Home Health | Payer: Medicare Other | Attending: Internal Medicine | Admitting: Internal Medicine

## 2022-01-12 DIAGNOSIS — L89154 Pressure ulcer of sacral region, stage 4: Secondary | ICD-10-CM | POA: Diagnosis present

## 2022-01-12 DIAGNOSIS — F32A Depression, unspecified: Secondary | ICD-10-CM

## 2022-01-12 DIAGNOSIS — Z978 Presence of other specified devices: Secondary | ICD-10-CM

## 2022-01-12 DIAGNOSIS — L97419 Non-pressure chronic ulcer of right heel and midfoot with unspecified severity: Secondary | ICD-10-CM

## 2022-01-12 DIAGNOSIS — D689 Coagulation defect, unspecified: Secondary | ICD-10-CM | POA: Diagnosis present

## 2022-01-12 DIAGNOSIS — E1169 Type 2 diabetes mellitus with other specified complication: Secondary | ICD-10-CM | POA: Diagnosis present

## 2022-01-12 DIAGNOSIS — Y846 Urinary catheterization as the cause of abnormal reaction of the patient, or of later complication, without mention of misadventure at the time of the procedure: Secondary | ICD-10-CM | POA: Diagnosis present

## 2022-01-12 DIAGNOSIS — Z20822 Contact with and (suspected) exposure to covid-19: Secondary | ICD-10-CM | POA: Diagnosis present

## 2022-01-12 DIAGNOSIS — M8668 Other chronic osteomyelitis, other site: Secondary | ICD-10-CM | POA: Diagnosis present

## 2022-01-12 DIAGNOSIS — N12 Tubulo-interstitial nephritis, not specified as acute or chronic: Secondary | ICD-10-CM | POA: Diagnosis present

## 2022-01-12 DIAGNOSIS — I129 Hypertensive chronic kidney disease with stage 1 through stage 4 chronic kidney disease, or unspecified chronic kidney disease: Secondary | ICD-10-CM | POA: Diagnosis present

## 2022-01-12 DIAGNOSIS — Z79899 Other long term (current) drug therapy: Secondary | ICD-10-CM

## 2022-01-12 DIAGNOSIS — Z7984 Long term (current) use of oral hypoglycemic drugs: Secondary | ICD-10-CM

## 2022-01-12 DIAGNOSIS — Z86718 Personal history of other venous thrombosis and embolism: Secondary | ICD-10-CM

## 2022-01-12 DIAGNOSIS — A419 Sepsis, unspecified organism: Secondary | ICD-10-CM | POA: Diagnosis present

## 2022-01-12 DIAGNOSIS — E119 Type 2 diabetes mellitus without complications: Secondary | ICD-10-CM

## 2022-01-12 DIAGNOSIS — F0393 Unspecified dementia, unspecified severity, with mood disturbance: Secondary | ICD-10-CM | POA: Diagnosis present

## 2022-01-12 DIAGNOSIS — L89613 Pressure ulcer of right heel, stage 3: Secondary | ICD-10-CM | POA: Diagnosis present

## 2022-01-12 DIAGNOSIS — L97429 Non-pressure chronic ulcer of left heel and midfoot with unspecified severity: Secondary | ICD-10-CM

## 2022-01-12 DIAGNOSIS — T83511A Infection and inflammatory reaction due to indwelling urethral catheter, initial encounter: Principal | ICD-10-CM | POA: Diagnosis present

## 2022-01-12 DIAGNOSIS — E871 Hypo-osmolality and hyponatremia: Secondary | ICD-10-CM | POA: Diagnosis present

## 2022-01-12 DIAGNOSIS — Z7901 Long term (current) use of anticoagulants: Secondary | ICD-10-CM

## 2022-01-12 DIAGNOSIS — M4628 Osteomyelitis of vertebra, sacral and sacrococcygeal region: Secondary | ICD-10-CM | POA: Diagnosis present

## 2022-01-12 DIAGNOSIS — Z933 Colostomy status: Secondary | ICD-10-CM

## 2022-01-12 DIAGNOSIS — Z298 Encounter for other specified prophylactic measures: Secondary | ICD-10-CM

## 2022-01-12 DIAGNOSIS — Z87820 Personal history of traumatic brain injury: Secondary | ICD-10-CM

## 2022-01-12 DIAGNOSIS — F319 Bipolar disorder, unspecified: Secondary | ICD-10-CM | POA: Diagnosis present

## 2022-01-12 DIAGNOSIS — Z818 Family history of other mental and behavioral disorders: Secondary | ICD-10-CM

## 2022-01-12 DIAGNOSIS — L89623 Pressure ulcer of left heel, stage 3: Secondary | ICD-10-CM | POA: Diagnosis present

## 2022-01-12 DIAGNOSIS — E1122 Type 2 diabetes mellitus with diabetic chronic kidney disease: Secondary | ICD-10-CM | POA: Diagnosis present

## 2022-01-12 DIAGNOSIS — E1129 Type 2 diabetes mellitus with other diabetic kidney complication: Secondary | ICD-10-CM | POA: Diagnosis present

## 2022-01-12 DIAGNOSIS — N1832 Chronic kidney disease, stage 3b: Secondary | ICD-10-CM | POA: Diagnosis present

## 2022-01-12 DIAGNOSIS — I10 Essential (primary) hypertension: Secondary | ICD-10-CM

## 2022-01-12 LAB — PROTIME-INR
INR: 1.4 — ABNORMAL HIGH (ref 0.8–1.2)
Prothrombin Time: 17.1 seconds — ABNORMAL HIGH (ref 11.4–15.2)

## 2022-01-12 LAB — COMPREHENSIVE METABOLIC PANEL
ALT: 24 U/L (ref 0–44)
AST: 19 U/L (ref 15–41)
Albumin: 3 g/dL — ABNORMAL LOW (ref 3.5–5.0)
Alkaline Phosphatase: 85 U/L (ref 38–126)
Anion gap: 10 (ref 5–15)
BUN: 26 mg/dL — ABNORMAL HIGH (ref 8–23)
CO2: 17 mmol/L — ABNORMAL LOW (ref 22–32)
Calcium: 8.1 mg/dL — ABNORMAL LOW (ref 8.9–10.3)
Chloride: 106 mmol/L (ref 98–111)
Creatinine, Ser: 1.78 mg/dL — ABNORMAL HIGH (ref 0.61–1.24)
GFR, Estimated: 42 mL/min — ABNORMAL LOW (ref >60)
Glucose, Bld: 188 mg/dL — ABNORMAL HIGH (ref 70–99)
Potassium: 4.7 mmol/L (ref 3.5–5.1)
Sodium: 133 mmol/L — ABNORMAL LOW (ref 135–145)
Total Bilirubin: 0.5 mg/dL (ref 0.3–1.2)
Total Protein: 8.4 g/dL — ABNORMAL HIGH (ref 6.5–8.1)

## 2022-01-12 LAB — CBC WITH DIFFERENTIAL/PLATELET
Abs Immature Granulocytes: 0.4 10*3/uL — ABNORMAL HIGH (ref 0.00–0.07)
Basophils Absolute: 0 10*3/uL (ref 0.0–0.1)
Basophils Relative: 0 %
Eosinophils Absolute: 0.1 10*3/uL (ref 0.0–0.5)
Eosinophils Relative: 0 %
HCT: 27.9 % — ABNORMAL LOW (ref 39.0–52.0)
Hemoglobin: 8.5 g/dL — ABNORMAL LOW (ref 13.0–17.0)
Immature Granulocytes: 3 %
Lymphocytes Relative: 6 %
Lymphs Abs: 0.9 10*3/uL (ref 0.7–4.0)
MCH: 26.7 pg (ref 26.0–34.0)
MCHC: 30.5 g/dL (ref 30.0–36.0)
MCV: 87.7 fL (ref 80.0–100.0)
Monocytes Absolute: 0.7 10*3/uL (ref 0.1–1.0)
Monocytes Relative: 5 %
Neutro Abs: 13.1 10*3/uL — ABNORMAL HIGH (ref 1.7–7.7)
Neutrophils Relative %: 86 %
Platelets: 296 10*3/uL (ref 150–400)
RBC: 3.18 MIL/uL — ABNORMAL LOW (ref 4.22–5.81)
RDW: 17.7 % — ABNORMAL HIGH (ref 11.5–15.5)
WBC: 15.1 10*3/uL — ABNORMAL HIGH (ref 4.0–10.5)
nRBC: 0.1 % (ref 0.0–0.2)

## 2022-01-12 LAB — APTT: aPTT: 37 seconds — ABNORMAL HIGH (ref 24–36)

## 2022-01-12 LAB — LACTIC ACID, PLASMA: Lactic Acid, Venous: 1.3 mmol/L (ref 0.5–1.9)

## 2022-01-12 MED ORDER — IOHEXOL 300 MG/ML  SOLN
75.0000 mL | Freq: Once | INTRAMUSCULAR | Status: AC | PRN
  Administered 2022-01-12: 75 mL via INTRAVENOUS

## 2022-01-12 MED ORDER — SODIUM CHLORIDE 0.9 % IV BOLUS (SEPSIS)
1000.0000 mL | Freq: Once | INTRAVENOUS | Status: AC
  Administered 2022-01-12: 1000 mL via INTRAVENOUS

## 2022-01-12 MED ORDER — SODIUM CHLORIDE 0.9 % IV SOLN
2.0000 g | Freq: Once | INTRAVENOUS | Status: AC
  Administered 2022-01-12: 2 g via INTRAVENOUS

## 2022-01-12 MED ORDER — VANCOMYCIN HCL 2000 MG/400ML IV SOLN
2000.0000 mg | Freq: Once | INTRAVENOUS | Status: AC
  Administered 2022-01-12: 2000 mg via INTRAVENOUS
  Filled 2022-01-12: qty 400

## 2022-01-12 NOTE — ED Triage Notes (Signed)
Pt BIB EMS from home.  One week into a 2 week course of anbx for sacral and foot wound.  Temp 101.9 with EMS.  Wife gave 1500 mg tylenol at 2000 tonight. Is on eliquis. No complaints other than fever.

## 2022-01-12 NOTE — Progress Notes (Signed)
Pt being followed by [facility] for Sepsis protocol. 

## 2022-01-12 NOTE — ED Provider Notes (Signed)
[facility] Provider Note    Event Date/Time   First MD Initiated Contact with Patient 01/12/22 2152     (approximate)   History   Chief Complaint: Fever   HPI  [name] is a 66 y.o. male with a history of bipolar disorder, hypertension, diabetes, urinary retention with chronic indwelling Foley, stage IIIb CKD, sacral osteomyelitis who comes to the ED from home due to fever.  Patient denies pain, but appears confused.    Outside records reviewed, noting he was recently hospitalized from July 8 to July 10 due to UTI with E. coli bacteremia, continued on antibiotics which he reports compliance with.  He was continued on Augmentin to be continued for an additional week after discharge.       Physical Exam   Triage Vital Signs: ED Triage Vitals  Enc Vitals Group     BP 01/12/22 2148 140/82     Pulse Rate 01/12/22 2148 99     Resp 01/12/22 2148 18     Temp 01/12/22 2148 100.1 F (37.8 C)     Temp Source 01/12/22 2148 Oral     SpO2 01/12/22 2148 100 %     Weight --      Height --      Head Circumference --      Peak Flow --      Pain Score 01/12/22 2142 0     Pain Loc --      Pain Edu? --      Excl. in GC? --     Most recent vital signs: Vitals:   01/12/22 2148 01/12/22 2155  BP: 140/82   Pulse: 99 95  Resp: 18 (!) 21  Temp: 100.1 F (37.8 C)   SpO2: 100% 98%    General: Awake, no distress.  CV:  Good peripheral perfusion.  Tachycardia heart rate 100 Resp:  Normal effort.  Clear to auscultation bilaterally Abd:  No distention.  Soft with right lower quadrant tenderness.  Colostomy present with normal output and ostomy appearance.  Indwelling Foley catheter present on arrival. Other:  Stage IV sacral decubitus ulcer with exposed muscle layers in the base of the wound.  No necrotic tissue or undermining or purulent drainage.  Stage III ulcer on bilateral heels.  These have small amount of foul-smelling discharge.  No  significant surrounding cellulitis.  No crepitus   ED Results / Procedures / Treatments   Labs (all labs ordered are listed, but only abnormal results are displayed) Labs Reviewed  CBC WITH DIFFERENTIAL/PLATELET - Abnormal; Notable for the following components:      Result Value   WBC 15.1 (*)    RBC 3.18 (*)    Hemoglobin 8.5 (*)    HCT 27.9 (*)    RDW 17.7 (*)    Neutro Abs 13.1 (*)    Abs Immature Granulocytes 0.40 (*)    All other components within normal limits  RESP PANEL BY RT-PCR (FLU A&B, COVID) ARPGX2  CULTURE, BLOOD (ROUTINE X 2)  CULTURE, BLOOD (ROUTINE X 2)  URINE CULTURE  LACTIC ACID, PLASMA  LACTIC ACID, PLASMA  COMPREHENSIVE METABOLIC PANEL  PROTIME-INR  APTT  URINALYSIS, COMPLETE (UACMP) WITH MICROSCOPIC     EKG Interpreted by me Sinus rhythm, rate of 95.  Normal axis and intervals.  Normal QRS ST segments and T waves.   RADIOLOGY Chest x-ray interpreted by me, negative for pneumonia or other acute findings.  Radiology report reviewed.  CT abdomen pelvis pending  PROCEDURES:  .Critical Care  Performed by: Sharman Cheek, MD Authorized by: Sharman Cheek, MD   Critical care provider statement:    Critical care time (minutes):  35   Critical care time was exclusive of:  Separately billable procedures and treating other patients   Critical care was necessary to treat or prevent imminent or life-threatening deterioration of the following conditions:  Sepsis   Critical care was time spent personally by me on the following activities:  Development of treatment plan with patient or surrogate, discussions with consultants, evaluation of patient's response to treatment, examination of patient, obtaining history from patient or surrogate, ordering and performing treatments and interventions, ordering and review of laboratory studies, ordering and review of radiographic studies, pulse oximetry, re-evaluation of patient's condition and review of old  charts Comments:        .1-3 Lead EKG Interpretation  Performed by: Sharman Cheek, MD Authorized by: Sharman Cheek, MD     Interpretation: abnormal     ECG rate:  100   ECG rate assessment: tachycardic     Rhythm: sinus tachycardia     Ectopy: none     Conduction: normal      MEDICATIONS ORDERED IN ED: Medications  sodium chloride 0.9 % bolus 1,000 mL (1,000 mLs Intravenous New Bag/Given 01/12/22 2212)  ceFEPIme (MAXIPIME) 2 g in sodium chloride 0.9 % 100 mL IVPB (2 g Intravenous New Bag/Given 01/12/22 2212)     IMPRESSION / MDM / ASSESSMENT AND PLAN / ED COURSE  I reviewed the triage vital signs and the nursing notes.                              Differential diagnosis includes, but is not limited to, sacral osteomyelitis, infected diabetic foot wound, Foley catheter associated UTI, bacteremia, sepsis  Patient's presentation is most consistent with acute presentation with potential threat to life or bodily function.  Patient presents with fever and tachycardia, currently on Augmentin worrisome for resistant infection.  Has a leukocytosis of 15,000.  Will initiate sepsis protocol with empiric cefepime and vancomycin, IV fluid bolus, obtain labs and CT abdomen pelvis.  We will need to admit for further management.       FINAL CLINICAL IMPRESSION(S) / ED DIAGNOSES   Final diagnoses:  Sepsis, due to unspecified organism, unspecified whether acute organ dysfunction present (HCC)  Chronic indwelling Foley catheter  Morbid obesity (HCC)  Type 2 diabetes mellitus without complication, without long-term current use of insulin (HCC)  Pressure injury of sacral region, stage 4 (HCC)  Heel ulcer, right, with unspecified severity (HCC)  Heel ulcer, left, with unspecified severity (HCC)     Rx / DC Orders   ED Discharge Orders     None        Note:  This document was prepared using Dragon voice recognition software and may include unintentional dictation  errors.   Sharman Cheek, MD 01/12/22 2223

## 2022-01-12 NOTE — Progress Notes (Signed)
CODE SEPSIS - PHARMACY COMMUNICATION  **Broad Spectrum Antibiotics should be administered within 1 hour of Sepsis diagnosis**  Time Code Sepsis Called/Page Received: 2201  Antibiotics Ordered: Cefepime  Time of 1st antibiotic administration: 2212  Otelia Sergeant, PharmD, [facility] 01/12/2022 9:58 PM

## 2022-01-12 NOTE — Progress Notes (Signed)
PHARMACY -  BRIEF ANTIBIOTIC NOTE   Pharmacy has received consults for cefepime from an ED provider.  The patient's profile has been reviewed for ht/wt/allergies/indication/available labs.    One time order(s) placed for cefepime 2 grams x 1  Further antibiotics/pharmacy consults should be ordered by admitting physician if indicated.                       Thank you, Jaynie Bream 01/12/2022  9:55 PM

## 2022-01-13 DIAGNOSIS — N1832 Chronic kidney disease, stage 3b: Secondary | ICD-10-CM | POA: Diagnosis present

## 2022-01-13 DIAGNOSIS — A4150 Gram-negative sepsis, unspecified: Secondary | ICD-10-CM | POA: Diagnosis not present

## 2022-01-13 DIAGNOSIS — I129 Hypertensive chronic kidney disease with stage 1 through stage 4 chronic kidney disease, or unspecified chronic kidney disease: Secondary | ICD-10-CM | POA: Diagnosis present

## 2022-01-13 DIAGNOSIS — Z86718 Personal history of other venous thrombosis and embolism: Secondary | ICD-10-CM | POA: Diagnosis not present

## 2022-01-13 DIAGNOSIS — F0393 Unspecified dementia, unspecified severity, with mood disturbance: Secondary | ICD-10-CM | POA: Diagnosis present

## 2022-01-13 DIAGNOSIS — F32A Depression, unspecified: Secondary | ICD-10-CM

## 2022-01-13 DIAGNOSIS — Z7984 Long term (current) use of oral hypoglycemic drugs: Secondary | ICD-10-CM | POA: Diagnosis not present

## 2022-01-13 DIAGNOSIS — L89623 Pressure ulcer of left heel, stage 3: Secondary | ICD-10-CM | POA: Diagnosis present

## 2022-01-13 DIAGNOSIS — R6520 Severe sepsis without septic shock: Secondary | ICD-10-CM | POA: Diagnosis not present

## 2022-01-13 DIAGNOSIS — L89613 Pressure ulcer of right heel, stage 3: Secondary | ICD-10-CM | POA: Diagnosis present

## 2022-01-13 DIAGNOSIS — Y846 Urinary catheterization as the cause of abnormal reaction of the patient, or of later complication, without mention of misadventure at the time of the procedure: Secondary | ICD-10-CM | POA: Diagnosis present

## 2022-01-13 DIAGNOSIS — I10 Essential (primary) hypertension: Secondary | ICD-10-CM

## 2022-01-13 DIAGNOSIS — N179 Acute kidney failure, unspecified: Secondary | ICD-10-CM | POA: Diagnosis not present

## 2022-01-13 DIAGNOSIS — N12 Tubulo-interstitial nephritis, not specified as acute or chronic: Secondary | ICD-10-CM | POA: Diagnosis present

## 2022-01-13 DIAGNOSIS — N10 Acute tubulo-interstitial nephritis: Secondary | ICD-10-CM

## 2022-01-13 DIAGNOSIS — Z79899 Other long term (current) drug therapy: Secondary | ICD-10-CM | POA: Diagnosis not present

## 2022-01-13 DIAGNOSIS — L89154 Pressure ulcer of sacral region, stage 4: Secondary | ICD-10-CM | POA: Diagnosis present

## 2022-01-13 DIAGNOSIS — E1122 Type 2 diabetes mellitus with diabetic chronic kidney disease: Secondary | ICD-10-CM

## 2022-01-13 DIAGNOSIS — N390 Urinary tract infection, site not specified: Secondary | ICD-10-CM

## 2022-01-13 DIAGNOSIS — M4628 Osteomyelitis of vertebra, sacral and sacrococcygeal region: Secondary | ICD-10-CM | POA: Diagnosis present

## 2022-01-13 DIAGNOSIS — D689 Coagulation defect, unspecified: Secondary | ICD-10-CM | POA: Diagnosis present

## 2022-01-13 DIAGNOSIS — Z20822 Contact with and (suspected) exposure to covid-19: Secondary | ICD-10-CM | POA: Diagnosis present

## 2022-01-13 DIAGNOSIS — A419 Sepsis, unspecified organism: Secondary | ICD-10-CM | POA: Diagnosis present

## 2022-01-13 DIAGNOSIS — Z933 Colostomy status: Secondary | ICD-10-CM | POA: Diagnosis not present

## 2022-01-13 DIAGNOSIS — Z87820 Personal history of traumatic brain injury: Secondary | ICD-10-CM | POA: Diagnosis not present

## 2022-01-13 DIAGNOSIS — Z298 Encounter for other specified prophylactic measures: Secondary | ICD-10-CM

## 2022-01-13 DIAGNOSIS — M8668 Other chronic osteomyelitis, other site: Secondary | ICD-10-CM | POA: Diagnosis present

## 2022-01-13 DIAGNOSIS — T83511A Infection and inflammatory reaction due to indwelling urethral catheter, initial encounter: Secondary | ICD-10-CM | POA: Diagnosis present

## 2022-01-13 DIAGNOSIS — E871 Hypo-osmolality and hyponatremia: Secondary | ICD-10-CM | POA: Diagnosis present

## 2022-01-13 DIAGNOSIS — F319 Bipolar disorder, unspecified: Secondary | ICD-10-CM | POA: Diagnosis present

## 2022-01-13 DIAGNOSIS — Z7901 Long term (current) use of anticoagulants: Secondary | ICD-10-CM | POA: Diagnosis not present

## 2022-01-13 DIAGNOSIS — Z2989 Encounter for other specified prophylactic measures: Secondary | ICD-10-CM

## 2022-01-13 DIAGNOSIS — Z818 Family history of other mental and behavioral disorders: Secondary | ICD-10-CM | POA: Diagnosis not present

## 2022-01-13 LAB — BASIC METABOLIC PANEL
Anion gap: 6 (ref 5–15)
BUN: 24 mg/dL — ABNORMAL HIGH (ref 8–23)
CO2: 19 mmol/L — ABNORMAL LOW (ref 22–32)
Calcium: 8.2 mg/dL — ABNORMAL LOW (ref 8.9–10.3)
Chloride: 109 mmol/L (ref 98–111)
Creatinine, Ser: 1.92 mg/dL — ABNORMAL HIGH (ref 0.61–1.24)
GFR, Estimated: 38 mL/min — ABNORMAL LOW (ref >60)
Glucose, Bld: 136 mg/dL — ABNORMAL HIGH (ref 70–99)
Potassium: 4.6 mmol/L (ref 3.5–5.1)
Sodium: 134 mmol/L — ABNORMAL LOW (ref 135–145)

## 2022-01-13 LAB — CBC
HCT: 27.5 % — ABNORMAL LOW (ref 39.0–52.0)
Hemoglobin: 8.2 g/dL — ABNORMAL LOW (ref 13.0–17.0)
MCH: 26.5 pg (ref 26.0–34.0)
MCHC: 29.8 g/dL — ABNORMAL LOW (ref 30.0–36.0)
MCV: 88.7 fL (ref 80.0–100.0)
Platelets: 275 10*3/uL (ref 150–400)
RBC: 3.1 MIL/uL — ABNORMAL LOW (ref 4.22–5.81)
RDW: 18.2 % — ABNORMAL HIGH (ref 11.5–15.5)
WBC: 14.9 10*3/uL — ABNORMAL HIGH (ref 4.0–10.5)
nRBC: 0.1 % (ref 0.0–0.2)

## 2022-01-13 LAB — URINALYSIS, COMPLETE (UACMP) WITH MICROSCOPIC
Bilirubin Urine: NEGATIVE
Glucose, UA: NEGATIVE mg/dL
Ketones, ur: NEGATIVE mg/dL
Nitrite: NEGATIVE
Protein, ur: >=300 mg/dL — AB
Specific Gravity, Urine: 1.017 (ref 1.005–1.030)
WBC, UA: >50 WBC/hpf — ABNORMAL HIGH (ref 0–5)
pH: 5 (ref 5.0–8.0)

## 2022-01-13 LAB — PROCALCITONIN: Procalcitonin: 0.77 ng/mL

## 2022-01-13 LAB — CBG MONITORING, ED: Glucose-Capillary: 120 mg/dL — ABNORMAL HIGH (ref 70–99)

## 2022-01-13 LAB — PROTIME-INR
INR: 1.4 — ABNORMAL HIGH (ref 0.8–1.2)
Prothrombin Time: 17.3 seconds — ABNORMAL HIGH (ref 11.4–15.2)

## 2022-01-13 MED ORDER — ACETAMINOPHEN 325 MG PO TABS: 650.0000 mg | ORAL_TABLET | Freq: Four times a day (QID) | ORAL | Status: DC | PRN

## 2022-01-13 MED ORDER — ONDANSETRON HCL 4 MG/2ML IJ SOLN: 4.0000 mg | Freq: Four times a day (QID) | INTRAMUSCULAR | Status: DC | PRN

## 2022-01-13 MED ORDER — ROSUVASTATIN CALCIUM 10 MG PO TABS
20.0000 mg | ORAL_TABLET | Freq: Every day | ORAL | Status: DC
  Administered 2022-01-13 – 2022-01-15 (×3): 20 mg via ORAL
  Filled 2022-01-13: qty 2
  Filled 2022-01-13: qty 1
  Filled 2022-01-13: qty 2

## 2022-01-13 MED ORDER — OXYCODONE HCL 5 MG PO TABS: 5.0000 mg | ORAL_TABLET | ORAL | Status: DC | PRN

## 2022-01-13 MED ORDER — MAGNESIUM HYDROXIDE 400 MG/5ML PO SUSP: 30.0000 mL | Freq: Every day | ORAL | Status: DC | PRN

## 2022-01-13 MED ORDER — OXCARBAZEPINE 300 MG PO TABS
300.0000 mg | ORAL_TABLET | Freq: Two times a day (BID) | ORAL | Status: DC
  Administered 2022-01-13 – 2022-01-15 (×5): 300 mg via ORAL
  Filled 2022-01-13 (×6): qty 1

## 2022-01-13 MED ORDER — MEMANTINE HCL 5 MG PO TABS
10.0000 mg | ORAL_TABLET | Freq: Two times a day (BID) | ORAL | Status: DC
  Administered 2022-01-13 – 2022-01-15 (×5): 10 mg via ORAL
  Filled 2022-01-13 (×5): qty 2

## 2022-01-13 MED ORDER — AMLODIPINE BESYLATE 5 MG PO TABS
5.0000 mg | ORAL_TABLET | Freq: Every day | ORAL | Status: DC
  Administered 2022-01-13 – 2022-01-15 (×3): 5 mg via ORAL
  Filled 2022-01-13 (×3): qty 1

## 2022-01-13 MED ORDER — QUETIAPINE FUMARATE 25 MG PO TABS
100.0000 mg | ORAL_TABLET | Freq: Every day | ORAL | Status: DC
  Administered 2022-01-13 – 2022-01-14 (×2): 100 mg via ORAL
  Filled 2022-01-13 (×2): qty 4

## 2022-01-13 MED ORDER — ACETAMINOPHEN 650 MG RE SUPP: 650.0000 mg | Freq: Four times a day (QID) | RECTAL | Status: DC | PRN

## 2022-01-13 MED ORDER — SODIUM CHLORIDE 0.9 % IV SOLN
2.0000 g | INTRAVENOUS | Status: DC
  Administered 2022-01-13 – 2022-01-15 (×3): 2 g via INTRAVENOUS
  Filled 2022-01-13: qty 20
  Filled 2022-01-13: qty 2
  Filled 2022-01-13: qty 20

## 2022-01-13 MED ORDER — VITAMIN B-12 1000 MCG PO TABS
3000.0000 ug | ORAL_TABLET | Freq: Every day | ORAL | Status: DC
  Administered 2022-01-13 – 2022-01-15 (×3): 3000 ug via ORAL
  Filled 2022-01-13 (×3): qty 3

## 2022-01-13 MED ORDER — APIXABAN 5 MG PO TABS
5.0000 mg | ORAL_TABLET | Freq: Two times a day (BID) | ORAL | Status: DC
  Administered 2022-01-13 – 2022-01-15 (×5): 5 mg via ORAL
  Filled 2022-01-13 (×5): qty 1

## 2022-01-13 MED ORDER — ZINC SULFATE 220 (50 ZN) MG PO CAPS
220.0000 mg | ORAL_CAPSULE | Freq: Every day | ORAL | Status: DC
  Administered 2022-01-13 – 2022-01-15 (×3): 220 mg via ORAL
  Filled 2022-01-13 (×3): qty 1

## 2022-01-13 MED ORDER — IRBESARTAN 150 MG PO TABS
150.0000 mg | ORAL_TABLET | Freq: Every day | ORAL | Status: DC
  Administered 2022-01-13 – 2022-01-15 (×3): 150 mg via ORAL
  Filled 2022-01-13 (×3): qty 1

## 2022-01-13 MED ORDER — GLIPIZIDE ER 2.5 MG PO TB24
2.5000 mg | ORAL_TABLET | Freq: Every day | ORAL | Status: DC
  Administered 2022-01-13 – 2022-01-14 (×2): 2.5 mg via ORAL
  Filled 2022-01-13 (×2): qty 1

## 2022-01-13 MED ORDER — SODIUM CHLORIDE 0.9 % IV SOLN: INTRAVENOUS | Status: DC

## 2022-01-13 MED ORDER — OMEGA-3-ACID ETHYL ESTERS 1 G PO CAPS
1.0000 | ORAL_CAPSULE | Freq: Every day | ORAL | Status: DC
  Administered 2022-01-13 – 2022-01-15 (×3): 1 g via ORAL
  Filled 2022-01-13 (×3): qty 1

## 2022-01-13 MED ORDER — TRAZODONE HCL 50 MG PO TABS
25.0000 mg | ORAL_TABLET | Freq: Every evening | ORAL | Status: DC | PRN
  Administered 2022-01-13: 25 mg via ORAL
  Filled 2022-01-13: qty 1

## 2022-01-13 MED ORDER — ASCORBIC ACID 500 MG PO TABS
500.0000 mg | ORAL_TABLET | Freq: Every day | ORAL | Status: DC
  Administered 2022-01-13 – 2022-01-15 (×3): 500 mg via ORAL
  Filled 2022-01-13 (×3): qty 1

## 2022-01-13 MED ORDER — VITAMIN D 25 MCG (1000 UNIT) PO TABS
1000.0000 [IU] | ORAL_TABLET | Freq: Every day | ORAL | Status: DC
  Administered 2022-01-13 – 2022-01-15 (×3): 1000 [IU] via ORAL
  Filled 2022-01-13 (×5): qty 1

## 2022-01-13 MED ORDER — ONDANSETRON HCL 4 MG PO TABS: 4.0000 mg | ORAL_TABLET | Freq: Four times a day (QID) | ORAL | Status: DC | PRN

## 2022-01-13 MED ORDER — AMLODIPINE-OLMESARTAN 5-20 MG PO TABS
1.0000 | ORAL_TABLET | Freq: Every day | ORAL | Status: DC
Start: 2022-01-13 — End: 2022-01-13

## 2022-01-13 NOTE — H&P (Addendum)
[name]   PATIENT NAME: [name]    MR#:  [id number]  DATE OF BIRTH:  [birth year]  DATE OF ADMISSION:  01/12/2022  PRIMARY CARE PHYSICIAN: Dione Booze, MD   Patient is coming from: Home  REQUESTING/REFERRING PHYSICIAN: Delton Prairie, MD  CHIEF COMPLAINT:   Chief Complaint  Patient presents with   Fever    HISTORY OF PRESENT ILLNESS:  [name] is a 66 y.o. male with medical history significant for bipolar disorder, type 2 diabetes mellitus, CKD 3B, recurrent DVT on Eliquis hypertension and traumatic brain injury, presented to the emergency room with acute onset fever and chills with a Tmax of 101, that started yesterday.  He was recently admitted here for sepsis and E. coli bacteremia and discharged 5 days ago.  He denied any nausea or vomiting or abdominal pain.  No dysuria, oliguria, urinary frequency or urgency or flank pain.  No chest pain or dyspnea or cough or wheezing.  No rhinorrhea or nasal congestion or sore throat.  He had a Foley catheter was replaced.  ED Course: When he came to the ER, BP was 140/82 with heart rate 99 9 respiratory 18 and later 21 with temperature 100.1/37.8.  Labs revealed  mild hyponatremia 133 and a blood glucose of 188, BUN of 26 with a creatinine 1.78 better than previous levels consistent with CKD 3B, lactic acid 1.3 and CBC showed leukocytosis of 15.1 with coagulopathy with INR 1.4 and PT 17.1 and PTT 37. EKG as reviewed by me : EKG showed sinus rhythm with a rate of 95 with T wave inversion inferiorly. Imaging: Portable chest x-ray showed no acute cardiopulmonary disease. - Abdominal pelvic CT scan with contrast revealed the following: 1. No acute intra-abdominal pathology identified. No definite radiographic explanation for the patient's reported symptoms. 2. Interval improvement in right perinephric and periureteric inflammatory stranding. No perinephric fluid collections identified. No  hydronephrosis. 3. Extensive right coronary artery calcification. 4. Sacral decubitus ulcer with evidence of chronic osteomyelitis involving the sacrum and coccyx. No associated drainable fluid collection. 5. Bilateral hip effusions, right greater than left. 6. Multifactorial severe central canal stenosis at the thoracolumbar junction. This may be better assessed with dedicated MRI examination if indicated. 7.  Aortic Atherosclerosis PAST MEDICAL HISTORY:   Past Medical History:  Diagnosis Date   Bipolar disorder (HCC)    Diabetes mellitus without complication (HCC)    History of blood clots    Hypertension    TBI (traumatic brain injury) (HCC)     PAST SURGICAL HISTORY:   Past Surgical History:  Procedure Laterality Date   BACK SURGERY     CARPAL TUNNEL RELEASE Bilateral    COLON SURGERY     COLOSTOMY     IR PERC TUN PERIT CATH WO PORT S&I /IMAG  10/11/2021   IR REMOVAL TUN CV CATH W/O FL  11/19/2021   IR US GUIDE VASC ACCESS RIGHT  10/11/2021   TONSILLECTOMY      SOCIAL HISTORY:   Social History   Tobacco Use   Smoking status: Never    Passive exposure: Never   Smokeless tobacco: Never  Substance Use Topics   Alcohol use: Yes    FAMILY HISTORY:   Family History  Problem Relation Age of Onset   Depression Father    Deep vein thrombosis Father     DRUG ALLERGIES:  No Known Allergies  REVIEW OF SYSTEMS:   ROS As per history of present illness. All pertinent systems  were reviewed above. Constitutional, HEENT, cardiovascular, respiratory, GI, GU, musculoskeletal, neuro, psychiatric, endocrine, integumentary and hematologic systems were reviewed and are otherwise negative/unremarkable except for positive findings mentioned above in the HPI.   MEDICATIONS AT HOME:   Prior to Admission medications   Medication Sig Start Date End Date Taking? Authorizing Provider  amLODipine-olmesartan (AZOR) 5-20 MG tablet Take 1 tablet by mouth daily. 01/08/22  Yes [provider]  amoxicillin-clavulanate (AUGMENTIN) 875-125 MG tablet Take 1 tablet by mouth 2 (two) times daily for 7 days. 01/07/22 01/14/22 Yes Ghimire, Dante Gang, MD  ascorbic acid (VITAMIN C) 500 MG tablet Take 1 tablet (500 mg total) by mouth daily. 07/31/21  Yes Fritzi Mandes, MD  cholecalciferol (VITAMIN D) 25 MCG tablet Take 1 tablet (1,000 Units total) by mouth daily. 07/31/21  Yes Fritzi Mandes, MD  ELIQUIS 5 MG TABS tablet Take 5 mg by mouth 2 (two) times daily.  11/17/18  Yes [provider]  fish oil-omega-3 fatty acids 1000 MG capsule SMARTSIG:1 Capsule(s) By Mouth Daily 10/12/21  Yes [provider]  glipiZIDE (GLUCOTROL XL) 2.5 MG 24 hr tablet Take 1 tablet (2.5 mg total) by mouth daily with breakfast. 06/25/21  Yes Wieting, Richard, MD  memantine (NAMENDA) 10 MG tablet Take 10 mg by mouth 2 (two) times daily.   Yes [provider]  Oxcarbazepine (TRILEPTAL) 300 MG tablet Take 1 tablet (300 mg total) by mouth 2 (two) times daily. 12/24/21  Yes Ursula Alert, MD  QUEtiapine (SEROQUEL) 100 MG tablet Take 1 tablet (100 mg total) by mouth at bedtime. 12/24/21  Yes Ursula Alert, MD  rosuvastatin (CRESTOR) 20 MG tablet Take 20 mg by mouth daily.  08/29/17  Yes [provider]  vitamin B-12 (CYANOCOBALAMIN) 1000 MCG tablet Take 3,000 mcg by mouth daily.   Yes [provider]  zinc sulfate 220 (50 Zn) MG capsule Take 1 capsule (220 mg total) by mouth daily. 07/31/21  Yes Fritzi Mandes, MD  QUEtiapine (SEROQUEL) 50 MG tablet TAKE 1/2 TO 1 (ONE-HALF TO ONE) TABLET BY MOUTH ONCE DAILY AS NEEDED FOR  AGITATION 12/24/21   Ursula Alert, MD      VITAL SIGNS:  Blood pressure 121/64, pulse 69, temperature 97.9 F (36.6 C), temperature source Oral, resp. rate 14, SpO2 100 %.  PHYSICAL EXAMINATION:  Physical Exam  GENERAL:  66 y.o.-year-old African-American male patient lying in the bed with no acute distress.  EYES: Pupils equal, round, reactive to light and  accommodation. No scleral icterus. Extraocular muscles intact.  HEENT: Head atraumatic, normocephalic. Oropharynx and nasopharynx clear.  NECK:  Supple, no jugular venous distention. No thyroid enlargement, no tenderness.  LUNGS: Normal breath sounds bilaterally, no wheezing, rales,rhonchi or crepitation. No use of accessory muscles of respiration.  CARDIOVASCULAR: Regular rate and rhythm, S1, S2 normal. No murmurs, rubs, or gallops.  ABDOMEN: Soft, nondistended, nontender. Bowel sounds present. No organomegaly or mass.  Left colostomy in place with brown stools. EXTREMITIES: No pedal edema, cyanosis, or clubbing.  NEUROLOGIC: Cranial nerves II through XII are intact. Muscle strength 5/5 in all extremities. Sensation intact. Gait not checked.  PSYCHIATRIC: The patient is alert and oriented x 3.  Normal affect and good eye contact. SKIN: No obvious rash, lesion, or ulcer.   LABORATORY PANEL:   CBC Recent Labs  Lab 01/12/22 2143  WBC 15.1*  HGB 8.5*  HCT 27.9*  PLT 296   ------------------------------------------------------------------------------------------------------------------  Chemistries  Recent Labs  Lab 01/06/22 0559 01/12/22 2143  NA 133* 133*  K 4.1 4.7  CL 106 106  CO2 21* 17*  GLUCOSE 90 188*  BUN 24* 26*  CREATININE 2.05* 1.78*  CALCIUM 8.4* 8.1*  MG 1.9  --   AST 15 19  ALT 14 24  ALKPHOS 86 85  BILITOT 0.4 0.5   ------------------------------------------------------------------------------------------------------------------  Cardiac Enzymes No results for input(s): "TROPONINI" in the last 168 hours. ------------------------------------------------------------------------------------------------------------------  RADIOLOGY:  CT ABDOMEN PELVIS W CONTRAST  Result Date: 01/12/2022 CLINICAL DATA:  Right lower quadrant abdominal pain, sepsis EXAM: CT ABDOMEN AND PELVIS WITH CONTRAST TECHNIQUE: Multidetector CT imaging of the abdomen and pelvis was  performed using the standard protocol following bolus administration of intravenous contrast. RADIATION DOSE REDUCTION: This exam was performed according to the departmental dose-optimization program which includes automated exposure control, adjustment of the mA and/or kV according to patient size and/or use of iterative reconstruction technique. CONTRAST:  10mL OMNIPAQUE IOHEXOL 300 MG/ML  SOLN COMPARISON:  01/05/2022 FINDINGS: Lower chest: Extensive right coronary artery calcification. Global cardiac size within normal limits. Visualized lung bases are clear. Hepatobiliary: No focal liver abnormality is seen. No gallstones, gallbladder wall thickening, or biliary dilatation. Pancreas: Unremarkable Spleen: Unremarkable Adrenals/Urinary Tract: The adrenal glands are unremarkable. The kidneys are normal in size and position. Previously noted right perinephric and periureteric inflammatory stranding has improved in the interval since prior examination. No perinephric fluid collections are seen. No hydronephrosis. No intrarenal or ureteral calculi. Foley catheter balloon is again seen within a decompressed bladder lumen. No perivesicular inflammatory stranding or fluid collections are identified. Stomach/Bowel: Diverting distal transverse loop colostomy is again identified within the left mid abdomen. The stomach, small bowel, and large bowel are otherwise unremarkable. Appendix normal. No free intraperitoneal gas or fluid. Vascular/Lymphatic: Aortic atherosclerosis. No enlarged abdominal or pelvic lymph nodes. Reproductive: Prostate is unremarkable. Other: Small broad-based fat containing umbilical hernia. Musculoskeletal: Sacral decubitus ulcer is again identified extending to the sacrum with erosion of the sacrum and coccyx in keeping with changes of chronic osteomyelitis in this location. No associated drainable fluid collection is identified. No associated inflammatory changes noted extending into the presacral  space. Changes of diffuse idiopathic skeletal hyperostosis noted within the lumbar spine. Superimposed degenerative changes are noted within the lumbar spine. In combination with congenital narrowing of the spinal canal, this results in multilevel severe central canal stenosis, most severe at T11-12, T12-L1, and L1-L2. Bilateral hip effusions are again identified, right greater than left. No acute bone abnormality. IMPRESSION: 1. No acute intra-abdominal pathology identified. No definite radiographic explanation for the patient's reported symptoms. 2. Interval improvement in right perinephric and periureteric inflammatory stranding. No perinephric fluid collections identified. No hydronephrosis. 3. Extensive right coronary artery calcification. 4. Sacral decubitus ulcer with evidence of chronic osteomyelitis involving the sacrum and coccyx. No associated drainable fluid collection. 5. Bilateral hip effusions, right greater than left. 6. Multifactorial severe central canal stenosis at the thoracolumbar junction. This may be better assessed with dedicated MRI examination if indicated. Aortic Atherosclerosis (ICD10-I70.0). Electronically Signed   By: Fidela Salisbury M.D.   On: 01/12/2022 23:05   DG Chest Port 1 View  Result Date: 01/12/2022 CLINICAL DATA:  Questionable sepsis. EXAM: PORTABLE CHEST 1 VIEW COMPARISON:  Chest radiograph dated 01/04/2022. FINDINGS: No focal consolidation, pleural effusion or pneumothorax. Borderline cardiomegaly. No acute osseous pathology. IMPRESSION: No active cardiopulmonary disease. Electronically Signed   By: Anner Crete M.D.   On: 01/12/2022 22:15      IMPRESSION AND PLAN:  Assessment and Plan: * Sepsis ([place]) -  This is like secondary to acute UTI and right pyelonephritis. - The patient will be admitted to a medical telemetry bed. - We will continue antibiotic therapy with IV Rocephin. - We will continue hydration with IV normal saline. - We will follow blood and  urine cultures.   Type II diabetes mellitus with renal manifestations (HCC) -This is associated with stage IIIb chronic kidney disease. - The patient will be placed on supplement coverage with NovoLog. - We will continue Glucotrol XL.  Essential hypertension - The patient will be placed on as needed IV labetalol.  Depression - He has a history of dementia. - We will continue Namenda and continue Seroquel.  Seizure prophylaxis - Has a history of traumatic brain injury. - We will continue Trileptal     DVT prophylaxis: Lovenox.  Advanced Care Planning:  Code Status: full code.  Family Communication:  The plan of care was discussed in details with the patient (and family). I answered all questions. The patient agreed to proceed with the above mentioned plan. Further management will depend upon hospital course. Disposition Plan: Back to previous home environment Consults called: none.  All the records are reviewed and case discussed with ED provider.  Status is: Inpatient   At the time of the admission, it appears that the appropriate admission status for this patient is inpatient.  This is judged to be reasonable and necessary in order to provide the required intensity of service to ensure the patient's safety given the presenting symptoms, physical exam findings and initial radiographic and laboratory data in the context of comorbid conditions.  The patient requires inpatient status due to high intensity of service, high risk of further deterioration and high frequency of surveillance required.  I certify that at the time of admission, it is my clinical judgment that the patient will require inpatient hospital care extending more than 2 midnights.                            Dispo: The patient is from: Home              Anticipated d/c is to: Home              Patient currently is not medically stable to d/c.              Difficult to place patient: No  Hannah Beat M.D on  01/13/2022 at 5:04 AM  [facility]   From 7 PM-7 AM, contact night-coverage [email or web address]  CC: Primary care physician; Dione Booze, MD

## 2022-01-13 NOTE — Assessment & Plan Note (Signed)
-   Has a history of traumatic brain injury. - We will continue Trileptal

## 2022-01-13 NOTE — Assessment & Plan Note (Signed)
-   The patient will be placed on as needed IV labetalol. 

## 2022-01-13 NOTE — Progress Notes (Signed)
Brief hospitalist update note.  This is a nonbillable note.  Please see same-day H&P for full billable details.  Briefly, this is a 66 year old male history significant for bipolar disorder, TBI, type II DM M, CKD stage IIIb, DVT on Eliquis who presents with fever and chills Tmax 101 that started 1 day prior to presentation.  Recent admission for sepsis and E. coli bacteremia.  Discharged 5 days prior to presentation.  Presentation consistent with sepsis secondary to UTI.  Blood and urine cultures taken on admission.  Rocephin 2 g daily started.  IV hydration started.  We will continue above interventions and monitor vitals and fever curve.  Monitor culture data.  Adjust antibiotics as necessary.  Lolita Patella MD  No charge

## 2022-01-13 NOTE — Assessment & Plan Note (Addendum)
-   This is like secondary to acute UTI and right pyelonephritis. - The patient will be admitted to a medical telemetry bed. - We will continue antibiotic therapy with IV Rocephin. - We will continue hydration with IV normal saline. - We will follow blood and urine cultures.

## 2022-01-13 NOTE — Assessment & Plan Note (Addendum)
-  This is associated with stage IIIb chronic kidney disease. - The patient will be placed on supplement coverage with NovoLog. - We will continue Glucotrol XL.

## 2022-01-13 NOTE — Assessment & Plan Note (Signed)
-   He has a history of dementia. - We will continue Namenda and continue Seroquel.

## 2022-01-14 DIAGNOSIS — R6520 Severe sepsis without septic shock: Secondary | ICD-10-CM | POA: Diagnosis not present

## 2022-01-14 DIAGNOSIS — A419 Sepsis, unspecified organism: Secondary | ICD-10-CM

## 2022-01-14 DIAGNOSIS — N179 Acute kidney failure, unspecified: Secondary | ICD-10-CM | POA: Diagnosis not present

## 2022-01-14 LAB — CBC WITH DIFFERENTIAL/PLATELET
Abs Immature Granulocytes: 0.08 10*3/uL — ABNORMAL HIGH (ref 0.00–0.07)
Basophils Absolute: 0 10*3/uL (ref 0.0–0.1)
Basophils Relative: 0 %
Eosinophils Absolute: 0.2 10*3/uL (ref 0.0–0.5)
Eosinophils Relative: 2 %
HCT: 27.5 % — ABNORMAL LOW (ref 39.0–52.0)
Hemoglobin: 8.4 g/dL — ABNORMAL LOW (ref 13.0–17.0)
Immature Granulocytes: 1 %
Lymphocytes Relative: 12 %
Lymphs Abs: 1.2 10*3/uL (ref 0.7–4.0)
MCH: 26.4 pg (ref 26.0–34.0)
MCHC: 30.5 g/dL (ref 30.0–36.0)
MCV: 86.5 fL (ref 80.0–100.0)
Monocytes Absolute: 0.9 10*3/uL (ref 0.1–1.0)
Monocytes Relative: 9 %
Neutro Abs: 7.6 10*3/uL (ref 1.7–7.7)
Neutrophils Relative %: 76 %
Platelets: 271 10*3/uL (ref 150–400)
RBC: 3.18 MIL/uL — ABNORMAL LOW (ref 4.22–5.81)
RDW: 17.9 % — ABNORMAL HIGH (ref 11.5–15.5)
WBC: 10 10*3/uL (ref 4.0–10.5)
nRBC: 0 % (ref 0.0–0.2)

## 2022-01-14 LAB — GLUCOSE, CAPILLARY
Glucose-Capillary: 144 mg/dL — ABNORMAL HIGH (ref 70–99)
Glucose-Capillary: 121 mg/dL — ABNORMAL HIGH (ref 70–99)
Glucose-Capillary: 120 mg/dL — ABNORMAL HIGH (ref 70–99)

## 2022-01-14 LAB — BASIC METABOLIC PANEL
Anion gap: 6 (ref 5–15)
BUN: 21 mg/dL (ref 8–23)
CO2: 17 mmol/L — ABNORMAL LOW (ref 22–32)
Calcium: 8.3 mg/dL — ABNORMAL LOW (ref 8.9–10.3)
Chloride: 111 mmol/L (ref 98–111)
Creatinine, Ser: 1.6 mg/dL — ABNORMAL HIGH (ref 0.61–1.24)
GFR, Estimated: 47 mL/min — ABNORMAL LOW (ref >60)
Glucose, Bld: 111 mg/dL — ABNORMAL HIGH (ref 70–99)
Potassium: 5.3 mmol/L — ABNORMAL HIGH (ref 3.5–5.1)
Sodium: 134 mmol/L — ABNORMAL LOW (ref 135–145)

## 2022-01-14 LAB — RESP PANEL BY RT-PCR (FLU A&B, COVID) ARPGX2
Influenza A by PCR: NEGATIVE
Influenza B by PCR: NEGATIVE
SARS Coronavirus 2 by RT PCR: NEGATIVE

## 2022-01-14 MED ORDER — INSULIN ASPART 100 UNIT/ML IJ SOLN: 0.0000 [IU] | Freq: Every day | INTRAMUSCULAR | Status: DC

## 2022-01-14 MED ORDER — CHLORHEXIDINE GLUCONATE CLOTH 2 % EX PADS
6.0000 | MEDICATED_PAD | Freq: Every day | CUTANEOUS | Status: DC
  Administered 2022-01-14 – 2022-01-15 (×2): 6 via TOPICAL

## 2022-01-14 MED ORDER — QUETIAPINE FUMARATE 25 MG PO TABS: 50.0000 mg | ORAL_TABLET | Freq: Every day | ORAL | Status: DC | PRN

## 2022-01-14 MED ORDER — INSULIN ASPART 100 UNIT/ML IJ SOLN
0.0000 [IU] | Freq: Three times a day (TID) | INTRAMUSCULAR | Status: DC
  Administered 2022-01-14: 2 [IU] via SUBCUTANEOUS
  Administered 2022-01-15: 3 [IU] via SUBCUTANEOUS
  Filled 2022-01-14 (×2): qty 1

## 2022-01-14 NOTE — Progress Notes (Signed)
PROGRESS NOTE    [name]  NLG:[id number] DOB: 29-Oct-1955 DOA: 01/12/2022 PCP: Dione Booze, MD    Brief Narrative:   66 year old male history significant for bipolar disorder, TBI, type II DM M, CKD stage IIIb, DVT on Eliquis who presents with fever and chills Tmax 101 that started 1 day prior to presentation.  Recent admission for sepsis and E. coli bacteremia.  Discharged 5 days prior to presentation.  Presentation consistent with sepsis secondary to UTI.  Blood and urine cultures taken on admission.  Rocephin 2 g daily started.  IV hydration started.    Assessment & Plan:   Principal Problem:   Sepsis (HCC) Active Problems:   Type II diabetes mellitus with renal manifestations (HCC)   Essential hypertension   Seizure prophylaxis   Depression  Sepsis Recurrent UTI Right-sided pyelonephritis Unclear why patient had recurrence of his infection.  Questionable antibiotic nonadherence. 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 Kidney function at baseline DC glipizide Moderate SSI Carb modified diet  Essential hypertension Continue amlodipine 5 mg daily Continue irbesartan 150 mg daily  Depression History of dementia Continue home Namenda and Seroquel  History of TBI Continue Trileptal for seizure prophylaxis     DVT prophylaxis: SQ Lovenox Code Status: Full Family Communication: None today left VM for spouse stoma [phone] on 7/17 Disposition Plan: Status is: Inpatient Remains inpatient appropriate because: \Sepsis secondary to UTI/pyelonephritis on IV antibiotics   Level of care: Telemetry Medical  Consultants:  None  Procedures:  None  Antimicrobials: Ceftriaxone   Subjective: Seen and examined.  Sleepy this morning.   Somewhat flattened affect.  Otherwise stable in no distress.  Objective: Vitals:   01/13/22 1940 01/14/22 0504 01/14/22 0819 01/14/22 0824  BP: (!) 141/63 110/62  139/78  Pulse: 64 (!) 58  61  Resp: 16 16 14    Temp: 98.5 F (36.9 C) 97.7 F (36.5 C) 97.8 F (36.6 C)   TempSrc: Oral Oral Oral   SpO2: 100% 100%  98%    Intake/Output Summary (Last 24 hours) at 01/14/2022 1116 Last data filed at 01/14/2022 0500 Gross per 24 hour  Intake --  Output 525 ml  Net -525 ml   There were no vitals filed for this visit.  Examination:  General exam: NAD Respiratory system: Lungs clear.  Normal work of breathing.  Room air Cardiovascular system: S1-S2, RRR, no murmurs, no pedal edema Gastrointestinal system: Soft, NT/ND, normal bowel sounds. Central nervous system: Alert and oriented. No focal neurological deficits. Extremities: Symmetric 5 x 5 power. Skin: No rashes, lesions or ulcers Psychiatry: Judgement and insight appear normal. Mood & affect flattened.     Data Reviewed: I have personally reviewed following labs and imaging studies  CBC: Recent Labs  Lab 01/12/22 2143 01/13/22 0554 01/14/22 0900  WBC 15.1* 14.9* 10.0  NEUTROABS 13.1*  --  7.6  HGB 8.5* 8.2* 8.4*  HCT 27.9* 27.5* 27.5*  MCV 87.7 88.7 86.5  PLT 296 275 271   Basic Metabolic Panel: Recent Labs  Lab 01/12/22 2143 01/13/22 0554 01/14/22 0900  NA 133* 134* 134*  K 4.7 4.6 5.3*  CL 106 109 111  CO2 17* 19* 17*  GLUCOSE 188* 136* 111*  BUN 26* 24* 21  CREATININE 1.78* 1.92* 1.60*  CALCIUM 8.1* 8.2* 8.3*   GFR:  Estimated Creatinine Clearance: 56.5 mL/min (A) (by C-G formula based on SCr of 1.6 mg/dL (H)). Liver Function Tests: Recent Labs  Lab 01/12/22 2143  AST 19  ALT 24  ALKPHOS 85  BILITOT 0.5  PROT 8.4*  ALBUMIN 3.0*   No results for input(s): "LIPASE", "AMYLASE" in the last 168 hours. No results for input(s): "AMMONIA" in the last 168 hours. Coagulation Profile: Recent Labs  Lab  01/12/22 2143 01/13/22 0554  INR 1.4* 1.4*   Cardiac Enzymes: No results for input(s): "CKTOTAL", "CKMB", "CKMBINDEX", "TROPONINI" in the last 168 hours. BNP (last 3 results) No results for input(s): "PROBNP" in the last 8760 hours. HbA1C: No results for input(s): "HGBA1C" in the last 72 hours. CBG: Recent Labs  Lab 01/07/22 1207 01/13/22 0756  GLUCAP 129* 120*   Lipid Profile: No results for input(s): "CHOL", "HDL", "LDLCALC", "TRIG", "CHOLHDL", "LDLDIRECT" in the last 72 hours. Thyroid Function Tests: No results for input(s): "TSH", "T4TOTAL", "FREET4", "T3FREE", "THYROIDAB" in the last 72 hours. Anemia Panel: No results for input(s): "VITAMINB12", "FOLATE", "FERRITIN", "TIBC", "IRON", "RETICCTPCT" in the last 72 hours. Sepsis Labs: Recent Labs  Lab 01/12/22 2143 01/13/22 0554  PROCALCITON  --  0.77  LATICACIDVEN 1.3  --     Recent Results (from the past 240 hour(s))  Blood Culture (routine x 2)     Status: Abnormal   Collection Time: 01/04/22 10:56 PM   Specimen: BLOOD  Result Value Ref Range Status   Specimen Description   Final    BLOOD RIGHT ANTECUBITAL Performed at [facility], [address] [place], [place] [address]    Special Requests   Final    BOTTLES DRAWN AEROBIC AND ANAEROBIC Blood Culture adequate volume Performed at [facility], [address], [place], [place] [address]    Culture  Setup Time   Final    GRAM NEGATIVE RODS AEROBIC BOTTLE ONLY CRITICAL RESULT CALLED TO, READ BACK BY AND VERIFIED WITH: CARISSA [facility] ON 01/05/22 AT 1120 QSD Performed at [facility], [address] [place], [place] [address]    Culture (A)  Final    ESCHERICHIA COLI SUSCEPTIBILITIES PERFORMED ON PREVIOUS CULTURE WITHIN THE LAST 5 DAYS. Performed at [facility], [place] [address] [place], [place] [address]    Report Status 01/07/2022 FINAL  Final  Blood Culture (routine x 2)     Status: Abnormal   Collection Time: 01/04/22  10:56 PM   Specimen: BLOOD  Result Value Ref Range Status   Specimen Description   Final    BLOOD BLOOD LEFT FOREARM Performed at [facility], [address] [place], [place] [address]    Special Requests   Final    BOTTLES DRAWN AEROBIC AND ANAEROBIC Blood Culture adequate volume Performed at [facility], [address] [place], [place] [address]    Culture  Setup Time   Final    Organism ID to follow GRAM NEGATIVE RODS IN BOTH AEROBIC AND ANAEROBIC BOTTLES CRITICAL RESULT CALLED TO, READ BACK BY AND VERIFIED WITH: CARISSA [facility] ON 01/05/22 AT 1120 QSD Performed at [facility], [address] [place], [place] [address]    Culture ESCHERICHIA COLI (A)  Final   Report Status 01/07/2022 FINAL  Final   Organism ID, Bacteria ESCHERICHIA COLI  Final      Susceptibility   Escherichia coli - MIC*    AMPICILLIN 8 SENSITIVE Sensitive     CEFAZOLIN <=4 SENSITIVE Sensitive     CEFEPIME <=  0.12 SENSITIVE Sensitive     CEFTAZIDIME <=1 SENSITIVE Sensitive     CEFTRIAXONE <=0.25 SENSITIVE Sensitive     CIPROFLOXACIN <=0.25 SENSITIVE Sensitive     GENTAMICIN <=1 SENSITIVE Sensitive     IMIPENEM <=0.25 SENSITIVE Sensitive     TRIMETH/SULFA <=20 SENSITIVE Sensitive     AMPICILLIN/SULBACTAM <=2 SENSITIVE Sensitive     PIP/TAZO <=4 SENSITIVE Sensitive     * ESCHERICHIA COLI  Urine Culture     Status: Abnormal   Collection Time: 01/04/22 10:56 PM   Specimen: In/Out Cath Urine  Result Value Ref Range Status   Specimen Description   Final    IN/OUT CATH URINE Performed at [facility], [address], [place], [place] [address]    Special Requests   Final    NONE Performed at [facility], [address], [place], [place] [address]    Culture (A)  Final    >=100,000 COLONIES/mL MULTIPLE SPECIES PRESENT, SUGGEST RECOLLECTION   Report Status 01/06/2022 FINAL  Final  SARS Coronavirus 2 by RT PCR (hospital order, performed in [facility]  hospital lab) *cepheid single result test* Anterior Nasal Swab     Status: None   Collection Time: 01/04/22 10:56 PM   Specimen: Anterior Nasal Swab  Result Value Ref Range Status   SARS Coronavirus 2 by RT PCR NEGATIVE NEGATIVE Final    Comment: (NOTE) SARS-CoV-2 target nucleic acids are NOT DETECTED.  The SARS-CoV-2 RNA is generally detectable in upper and lower respiratory specimens during the acute phase of infection. The lowest concentration of SARS-CoV-2 viral copies this assay can detect is 250 copies / mL. A negative result does not preclude SARS-CoV-2 infection and should not be used as the sole basis for treatment or other patient management decisions.  A negative result may occur with improper specimen collection / handling, submission of specimen other than nasopharyngeal swab, presence of viral mutation(s) within the areas targeted by this assay, and inadequate number of viral copies (<250 copies / mL). A negative result must be combined with clinical observations, patient history, and epidemiological information.  Fact Sheet for Patients:   RoadLapTop.co.za  Fact Sheet for Healthcare Providers: http://kim-miller.com/  This test is not yet approved or  cleared by the [place] FDA and has been authorized for detection and/or diagnosis of SARS-CoV-2 by FDA under an Emergency Use Authorization (EUA).  This EUA will remain in effect (meaning this test can be used) for the duration of the COVID-19 declaration under Section 564(b)(1) of the Act, 21 U.S.C. section 360bbb-3(b)(1), unless the authorization is terminated or revoked sooner.  Performed at [facility], [address] [place], [place] [address]   Blood Culture ID Panel (Reflexed)     Status: Abnormal   Collection Time: 01/04/22 10:56 PM  Result Value Ref Range Status   Enterococcus faecalis NOT DETECTED NOT DETECTED Final   Enterococcus Faecium NOT  DETECTED NOT DETECTED Final   Listeria monocytogenes NOT DETECTED NOT DETECTED Final   Staphylococcus species NOT DETECTED NOT DETECTED Final   Staphylococcus aureus (BCID) NOT DETECTED NOT DETECTED Final   Staphylococcus epidermidis NOT DETECTED NOT DETECTED Final   Staphylococcus lugdunensis NOT DETECTED NOT DETECTED Final   Streptococcus species NOT DETECTED NOT DETECTED Final   Streptococcus agalactiae NOT DETECTED NOT DETECTED Final   Streptococcus pneumoniae NOT DETECTED NOT DETECTED Final   Streptococcus pyogenes NOT DETECTED NOT DETECTED Final   A.calcoaceticus-baumannii NOT DETECTED NOT DETECTED Final   Bacteroides fragilis NOT DETECTED  NOT DETECTED Final   Enterobacterales DETECTED (A) NOT DETECTED Final    Comment: Enterobacterales represent a large order of gram negative bacteria, not a single organism. CRITICAL RESULT CALLED TO, READ BACK BY AND VERIFIED WITH: CARISSA DOLAN ON 01/05/22 AT 1120 QSD    Enterobacter cloacae complex NOT DETECTED NOT DETECTED Final   Escherichia coli DETECTED (A) NOT DETECTED Final    Comment: CRITICAL RESULT CALLED TO, READ BACK BY AND VERIFIED WITH: CARISSA DOLAN ON 01/05/22 AT 1120 QSD    Klebsiella aerogenes NOT DETECTED NOT DETECTED Final   Klebsiella oxytoca NOT DETECTED NOT DETECTED Final   Klebsiella pneumoniae NOT DETECTED NOT DETECTED Final   Proteus species NOT DETECTED NOT DETECTED Final   Salmonella species NOT DETECTED NOT DETECTED Final   Serratia marcescens NOT DETECTED NOT DETECTED Final   Haemophilus influenzae NOT DETECTED NOT DETECTED Final   Neisseria meningitidis NOT DETECTED NOT DETECTED Final   Pseudomonas aeruginosa NOT DETECTED NOT DETECTED Final   Stenotrophomonas maltophilia NOT DETECTED NOT DETECTED Final   Candida albicans NOT DETECTED NOT DETECTED Final   Candida auris NOT DETECTED NOT DETECTED Final   Candida glabrata NOT DETECTED NOT DETECTED Final   Candida krusei NOT DETECTED NOT DETECTED Final   Candida  parapsilosis NOT DETECTED NOT DETECTED Final   Candida tropicalis NOT DETECTED NOT DETECTED Final   Cryptococcus neoformans/gattii NOT DETECTED NOT DETECTED Final   CTX-M ESBL NOT DETECTED NOT DETECTED Final   Carbapenem resistance IMP NOT DETECTED NOT DETECTED Final   Carbapenem resistance KPC NOT DETECTED NOT DETECTED Final   Carbapenem resistance NDM NOT DETECTED NOT DETECTED Final   Carbapenem resist OXA 48 LIKE NOT DETECTED NOT DETECTED Final   Carbapenem resistance VIM NOT DETECTED NOT DETECTED Final    Comment: Performed at [facility], [address] [place], [place] [address]  Blood Culture (routine x 2)     Status: None (Preliminary result)   Collection Time: 01/12/22  9:43 PM   Specimen: BLOOD  Result Value Ref Range Status   Specimen Description BLOOD BLOOD RIGHT FOREARM  Final   Special Requests   Final    BOTTLES DRAWN AEROBIC AND ANAEROBIC Blood Culture adequate volume   Culture   Final    NO GROWTH 2 DAYS Performed at [facility], [address] [place], [place] [address]    Report Status PENDING  Incomplete  Blood Culture (routine x 2)     Status: None (Preliminary result)   Collection Time: 01/12/22  9:57 PM   Specimen: BLOOD  Result Value Ref Range Status   Specimen Description BLOOD BLOOD RIGHT HAND  Final   Special Requests   Final    BOTTLES DRAWN AEROBIC AND ANAEROBIC Blood Culture results may not be optimal due to an inadequate volume of blood received in culture bottles   Culture   Final    NO GROWTH 2 DAYS Performed at [facility], [address] [place], [place] [address]    Report Status PENDING  Incomplete  Urine Culture     Status: Abnormal (Preliminary result)   Collection Time: 01/12/22 11:42 PM   Specimen: Urine, Random  Result Value Ref Range Status   Specimen Description   Final    URINE, RANDOM Performed at [facility], [address], [place], [place] [address]    Special Requests   Final     NONE Performed at [facility], [address], [place], [place] [address]  Culture (A)  Final    20,000 COLONIES/mL GRAM NEGATIVE RODS SUSCEPTIBILITIES TO FOLLOW Performed at [facility], [place] [address] [place], [place] [address]    Report Status PENDING  Incomplete  Resp Panel by RT-PCR (Flu A&B, Covid) Anterior Nasal Swab     Status: None   Collection Time: 01/13/22  8:30 AM   Specimen: Anterior Nasal Swab  Result Value Ref Range Status   SARS Coronavirus 2 by RT PCR NEGATIVE NEGATIVE Final    Comment: (NOTE) SARS-CoV-2 target nucleic acids are NOT DETECTED.  The SARS-CoV-2 RNA is generally detectable in upper respiratory specimens during the acute phase of infection. The lowest concentration of SARS-CoV-2 viral copies this assay can detect is 138 copies/mL. A negative result does not preclude SARS-Cov-2 infection and should not be used as the sole basis for treatment or other patient management decisions. A negative result may occur with  improper specimen collection/handling, submission of specimen other than nasopharyngeal swab, presence of viral mutation(s) within the areas targeted by this assay, and inadequate number of viral copies(<138 copies/mL). A negative result must be combined with clinical observations, patient history, and epidemiological information. The expected result is Negative.  Fact Sheet for Patients:  EntrepreneurPulse.com.au  Fact Sheet for Healthcare Providers:  IncredibleEmployment.be  This test is no t yet approved or cleared by the [place] FDA and  has been authorized for detection and/or diagnosis of SARS-CoV-2 by FDA under an Emergency Use Authorization (EUA). This EUA will remain  in effect (meaning this test can be used) for the duration of the COVID-19 declaration under Section 564(b)(1) of the Act, 21 U.S.C.section 360bbb-3(b)(1), unless the authorization is terminated  or  revoked sooner.       Influenza A by PCR NEGATIVE NEGATIVE Final   Influenza B by PCR NEGATIVE NEGATIVE Final    Comment: (NOTE) The Xpert Xpress SARS-CoV-2/FLU/RSV plus assay is intended as an aid in the diagnosis of influenza from Nasopharyngeal swab specimens and should not be used as a sole basis for treatment. Nasal washings and aspirates are unacceptable for Xpert Xpress SARS-CoV-2/FLU/RSV testing.  Fact Sheet for Patients: EntrepreneurPulse.com.au  Fact Sheet for Healthcare Providers: IncredibleEmployment.be  This test is not yet approved or cleared by the [place] FDA and has been authorized for detection and/or diagnosis of SARS-CoV-2 by FDA under an Emergency Use Authorization (EUA). This EUA will remain in effect (meaning this test can be used) for the duration of the COVID-19 declaration under Section 564(b)(1) of the Act, 21 U.S.C. section 360bbb-3(b)(1), unless the authorization is terminated or revoked.  Performed at [facility], [address] [place], [place] [address]          Radiology Studies: CT ABDOMEN PELVIS W CONTRAST  Result Date: 01/12/2022 CLINICAL DATA:  Right lower quadrant abdominal pain, sepsis EXAM: CT ABDOMEN AND PELVIS WITH CONTRAST TECHNIQUE: Multidetector CT imaging of the abdomen and pelvis was performed using the standard protocol following bolus administration of intravenous contrast. RADIATION DOSE REDUCTION: This exam was performed according to the departmental dose-optimization program which includes automated exposure control, adjustment of the mA and/or kV according to patient size and/or use of iterative reconstruction technique. CONTRAST:  12mL OMNIPAQUE IOHEXOL 300 MG/ML  SOLN COMPARISON:  01/05/2022 FINDINGS: Lower chest: Extensive right coronary artery calcification. Global cardiac size within normal limits. Visualized lung bases are clear. Hepatobiliary: No focal liver  abnormality is seen. No gallstones, gallbladder wall thickening, or biliary dilatation. Pancreas: Unremarkable Spleen: Unremarkable Adrenals/Urinary Tract:  The adrenal glands are unremarkable. The kidneys are normal in size and position. Previously noted right perinephric and periureteric inflammatory stranding has improved in the interval since prior examination. No perinephric fluid collections are seen. No hydronephrosis. No intrarenal or ureteral calculi. Foley catheter balloon is again seen within a decompressed bladder lumen. No perivesicular inflammatory stranding or fluid collections are identified. Stomach/Bowel: Diverting distal transverse loop colostomy is again identified within the left mid abdomen. The stomach, small bowel, and large bowel are otherwise unremarkable. Appendix normal. No free intraperitoneal gas or fluid. Vascular/Lymphatic: Aortic atherosclerosis. No enlarged abdominal or pelvic lymph nodes. Reproductive: Prostate is unremarkable. Other: Small broad-based fat containing umbilical hernia. Musculoskeletal: Sacral decubitus ulcer is again identified extending to the sacrum with erosion of the sacrum and coccyx in keeping with changes of chronic osteomyelitis in this location. No associated drainable fluid collection is identified. No associated inflammatory changes noted extending into the presacral space. Changes of diffuse idiopathic skeletal hyperostosis noted within the lumbar spine. Superimposed degenerative changes are noted within the lumbar spine. In combination with congenital narrowing of the spinal canal, this results in multilevel severe central canal stenosis, most severe at T11-12, T12-L1, and L1-L2. Bilateral hip effusions are again identified, right greater than left. No acute bone abnormality. IMPRESSION: 1. No acute intra-abdominal pathology identified. No definite radiographic explanation for the patient's reported symptoms. 2. Interval improvement in right perinephric  and periureteric inflammatory stranding. No perinephric fluid collections identified. No hydronephrosis. 3. Extensive right coronary artery calcification. 4. Sacral decubitus ulcer with evidence of chronic osteomyelitis involving the sacrum and coccyx. No associated drainable fluid collection. 5. Bilateral hip effusions, right greater than left. 6. Multifactorial severe central canal stenosis at the thoracolumbar junction. This may be better assessed with dedicated MRI examination if indicated. Aortic Atherosclerosis (ICD10-I70.0). Electronically Signed   By: Fidela Salisbury M.D.   On: 01/12/2022 23:05   DG Chest Port 1 View  Result Date: 01/12/2022 CLINICAL DATA:  Questionable sepsis. EXAM: PORTABLE CHEST 1 VIEW COMPARISON:  Chest radiograph dated 01/04/2022. FINDINGS: No focal consolidation, pleural effusion or pneumothorax. Borderline cardiomegaly. No acute osseous pathology. IMPRESSION: No active cardiopulmonary disease. Electronically Signed   By: Anner Crete M.D.   On: 01/12/2022 22:15        Scheduled Meds:  amLODipine  5 mg Oral Daily   And   irbesartan  150 mg Oral Daily   apixaban  5 mg Oral BID   ascorbic acid  500 mg Oral Daily   Chlorhexidine Gluconate Cloth  6 each Topical Daily   cholecalciferol  1,000 Units Oral Daily   glipiZIDE  2.5 mg Oral Q breakfast   memantine  10 mg Oral BID   omega-3 acid ethyl esters  1 capsule Oral Daily   Oxcarbazepine  300 mg Oral BID   QUEtiapine  100 mg Oral QHS   rosuvastatin  20 mg Oral Daily   vitamin B-12  3,000 mcg Oral Daily   zinc sulfate  220 mg Oral Daily   Continuous Infusions:  sodium chloride 100 mL/hr at 01/14/22 0522   cefTRIAXone (ROCEPHIN)  IV 2 g (01/14/22 0524)     LOS: 1 day      Sidney Ace, MD [facility]   If 7PM-7AM, please contact night-coverage  01/14/2022, 11:16 AM

## 2022-01-14 NOTE — TOC Initial Note (Signed)
Transition of Care [facility]) - Initial/Assessment Note    Patient Details  Name: [name] MRN: [id number] Date of Birth: 01/20/1956  Transition of Care [facility]) CM/SW Contact:    Chapman Fitch, RN Phone Number: 01/14/2022, 3:34 PM  Clinical Narrative:                 Assessment was completed by this TOC member on previous admission 01/07/22.  See note below from that admission  "Admitted EXB:[id number] Admitted from: Home with wife GMW:[id number] Pharmacy: [facility] Current home health/prior home health/DME: manuel WC, power WC, hospital bed, shower seat   Patient to discharge today.  Open with [facility] home [facility].  Resumption order faxed Minerva Areola with [facility] notified.    Wife states she will be transporting patient home after she gets off work at 3 pm.  Bedside RN notified "  Continue IV antibiotics at this time         Patient Goals and CMS Choice        Expected Discharge Plan and Services                                                Prior Living Arrangements/Services                       Activities of Daily Living      Permission Sought/Granted                  Emotional Assessment              Admission diagnosis:  Morbid obesity (HCC) [E66.01] Chronic indwelling Foley catheter [Z97.8] Sepsis (HCC) [A41.9] Heel ulcer, right, with unspecified severity (HCC) [L97.419] Heel ulcer, left, with unspecified severity (HCC) [L97.429] Type 2 diabetes mellitus without complication, without long-term current use of insulin (HCC) [E11.9] Pressure injury of sacral region, stage 4 (HCC) [L89.154] Sepsis, due to unspecified organism, unspecified whether acute organ dysfunction present [facility]) [A41.9] Patient Active Problem List   Diagnosis Date Noted   Essential hypertension 01/13/2022   Seizure prophylaxis 01/13/2022   Depression 01/13/2022   Acute pyelonephritis 01/05/2022   Fever 12/16/2021   Hypotension 10/09/2021    Abdominal distension 10/08/2021   AKI (acute kidney injury) (HCC) 10/08/2021   Sacral wound 10/07/2021   MSSA bacteremia 10/07/2021   Severe sepsis (HCC) 10/06/2021   Sepsis secondary to UTI (HCC) 07/28/2021   COVID-19 virus infection 07/28/2021   Chronic indwelling Foley catheter 06/22/2021   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   TBI (traumatic brain injury) (HCC)    Healed ulcer of foot on examination 04/14/2021   Acute UTI 02/23/2021   Osteomyelitis of vertebra, sacral and sacrococcygeal region (HCC) 10/18/2020   Hyponatremia 07/07/2020   Encephalopathy 07/07/2020   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) 07/07/2020   Anemia of chronic disease 07/07/2020   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) 06/01/2020   Sacral osteomyelitis (HCC) 04/13/2020   Urinary retention 03/19/2020   History of DVT (deep vein thrombosis) 03/19/2020   Pressure injury of sacral region, stage 4 (HCC) 03/19/2020   Severe sepsis with septic shock (HCC) 03/18/2020   Obesity, Class III, BMI 40-49.9 (morbid obesity) (HCC) 03/18/2020   Cellulitis 03/18/2020   Acute cystitis without hematuria    Stage 3b chronic kidney disease (CKD) (HCC) - baseline  SCr 1.8-1.9 01/20/2020   Decubitus ulcers 01/17/2020   Sepsis (HCC) 01/16/2020   Cellulitis of sacral region 01/16/2020   Acute kidney injury superimposed on chronic kidney disease (HCC) 01/16/2020   Acute metabolic encephalopathy 01/16/2020   Bipolar disorder, in full remission, most recent episode mixed (HCC) 12/29/2019   High risk medication use 10/25/2019   Noncompliance with treatment plan 10/25/2019   Bipolar I disorder, most recent episode mixed (HCC) 01/07/2019   GAD (generalized anxiety disorder) 01/07/2019   Insomnia due to medical condition 01/07/2019   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) 03/06/2016   Bipolar disorder (HCC) 01/03/2016   Lithium toxicity 01/02/2016    Chronic anticoagulation - on Eliquis 07/06/2014   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 [place] [address] Phone: [phone] Fax: [phone]     Social Determinants of Health (SDOH) Interventions    Readmission Risk Interventions    01/14/2022    3:23 PM 01/07/2022   10:14 AM 04/15/2021    2:50 PM  Readmission Risk Prevention Plan  Transportation Screening Complete Complete Complete  PCP or Specialist Appt within 3-5 Days   Complete  HRI or Home Care Consult   Complete  Social Work Consult for Recovery Care Planning/Counseling   Complete  Palliative Care Screening   Not Applicable  Medication Review Oceanographer) Complete Complete Complete  PCP or Specialist appointment within 3-5 days of discharge  Complete   HRI or Home Care Consult Complete Complete   SW Recovery Care/Counseling Consult  Complete   Palliative Care Screening Not Applicable Not Applicable   Skilled Nursing Facility  Patient Refused

## 2022-01-14 NOTE — Progress Notes (Signed)
   01/14/22 1522  Therapy Vitals  Resp 15  Mobility  Activity Stood at bedside;Dangled on edge of bed  Level of Assistance +2 (takes two people)  Press photographer wheel walker  Activity Response Tolerated well  $Mobility charge 1 Mobility    Mobility Specialist - Progress Note   Pt was supine in bed upon arrival on RA. Pt sat edge of bed and STS with Max +2 assist. Pt needed VC to stand upright. Pt displayed a flexed trunk, anterior lean posture. Foot blocking method used. Pt was supine in bed with needs in reach and bed alarm on. Note written by MS Burley supervised by MS Sharri Loya.   [name] Mobility Specialist 01/14/22, 3:33 PM

## 2022-01-15 ENCOUNTER — Ambulatory Visit: Payer: Medicare Other | Admitting: Psychiatry

## 2022-01-15 DIAGNOSIS — N179 Acute kidney failure, unspecified: Secondary | ICD-10-CM | POA: Diagnosis not present

## 2022-01-15 DIAGNOSIS — R6520 Severe sepsis without septic shock: Secondary | ICD-10-CM | POA: Diagnosis not present

## 2022-01-15 DIAGNOSIS — A419 Sepsis, unspecified organism: Secondary | ICD-10-CM | POA: Diagnosis not present

## 2022-01-15 LAB — GLUCOSE, CAPILLARY
Glucose-Capillary: 105 mg/dL — ABNORMAL HIGH (ref 70–99)
Glucose-Capillary: 113 mg/dL — ABNORMAL HIGH (ref 70–99)
Glucose-Capillary: 160 mg/dL — ABNORMAL HIGH (ref 70–99)

## 2022-01-15 LAB — URINE CULTURE: Culture: 20000 — AB

## 2022-01-15 MED ORDER — CIPROFLOXACIN HCL 500 MG PO TABS: 500.0000 mg | ORAL_TABLET | Freq: Two times a day (BID) | ORAL | 0 refills | Status: AC

## 2022-01-15 NOTE — Plan of Care (Signed)
Pt AAOx4, no pain. VS are stable. Cultures pending. Plan for mobility as tolerated. Bed is in lowest position, call light within reach. Will continue to monitor.

## 2022-01-15 NOTE — Plan of Care (Signed)
AVS and education provided. IV removed with no complications. VS are stable. Pt transported by [facility] to main lobby where wife will transport him home.

## 2022-01-15 NOTE — TOC Transition Note (Signed)
Transition of Care [facility]) - CM/SW Discharge Note   Patient Details  Name: [name] MRN: [id number] Date of Birth: 16-Mar-1956  Transition of Care [facility]) CM/SW Contact:  Chapman Fitch, RN Phone Number: 01/15/2022, 3:29 PM   Clinical Narrative:     Patient to discharge today. Wife to transport Per Minerva Areola with [facility] they do not have PT and OT available at this time  Wife updated and she is in agreement to switch back to [facility].  Elnita Maxwell with [facility] confirms they can accept the referral         Patient Goals and CMS Choice        Discharge Placement                       Discharge Plan and Services                                     Social Determinants of Health (SDOH) Interventions     Readmission Risk Interventions    01/14/2022    3:23 PM 01/07/2022   10:14 AM 04/15/2021    2:50 PM  Readmission Risk Prevention Plan  Transportation Screening Complete Complete Complete  PCP or Specialist Appt within 3-5 Days   Complete  HRI or Home Care Consult   Complete  Social Work Consult for Recovery Care Planning/Counseling   Complete  Palliative Care Screening   Not Applicable  Medication Review Oceanographer) Complete Complete Complete  PCP or Specialist appointment within 3-5 days of discharge  Complete   HRI or Home Care Consult Complete Complete   SW Recovery Care/Counseling Consult  Complete   Palliative Care Screening Not Applicable Not Applicable   Skilled Nursing Facility  Patient Refused

## 2022-01-15 NOTE — Discharge Summary (Signed)
Physician Discharge Summary  [name] ZDG:[id number] DOB: October 15, 1955 DOA: 01/12/2022  PCP: Dione Booze, MD  Admit date: 01/12/2022 Discharge date: 01/15/2022  Admitted From: Home Disposition:  Home with home health  Recommendations for Outpatient Follow-up:  Follow up with PCP in 1-2 weeks   Home Health:Yes PT OT SW Equipment/Devices:None  Discharge Condition:Stable CODE STATUS:FULL  Diet recommendation: Reg  Brief/Interim Summary:   66 year old male history significant for bipolar disorder, TBI, type II DM M, CKD stage IIIb, DVT on Eliquis who presents with fever and chills Tmax 101 that started 1 day prior to presentation.  Recent admission for sepsis and E. coli bacteremia.  Discharged 5 days prior to presentation.  Presentation consistent with sepsis secondary to UTI.  Blood and urine cultures taken on admission.  Rocephin 2 g daily started.  IV hydration started.    Patient was maintained on IV Rocephin during course of admission.  Sepsis physiology resolved.  Patient clinically improved.  No white count no fevers.  Urine culture with 20,000 colonies per milliliter of Pseudomonas.  I strongly suspect this to be a contaminant.  Adherence to previously prescribed oral antibiotic regimen is in question.  As such will transition to p.o. ciprofloxacin.  Will recommend additional 7 days for total 10-day antibiotic course.  Patient strongly advised to complete course of antibiotics post DC.  Discharge plan explained to patient and patient's wife via phone.  Discharged home in stable condition.  Home health PT OT and social work have been ordered.  Discharge Diagnoses:  Principal Problem:   Sepsis (HCC) Active Problems:   Type II diabetes mellitus with renal manifestations (HCC)   Essential hypertension   Seizure prophylaxis   Depression  Sepsis Recurrent UTI Right-sided pyelonephritis Unclear why patient had recurrence of his infection.  Questionable antibiotic  nonadherence. Plan: Patient responded readily to intravenous Rocephin.  Pseudomonas seen on urine culture suspected to be contaminant.  Will transition to p.o. ciprofloxacin.  500 mg p.o. twice daily.  Complete additional 7 days.  Follow-up outpatient PCP.  Patient advised to complete antibiotic course  Type 2 diabetes mellitus with renal manifestations Stage IIIb chronic kidney disease Kidney function at baseline   Discharge Instructions  Discharge Instructions     Diet - low sodium heart healthy   Complete by: As directed    Increase activity slowly   Complete by: As directed    No wound care   Complete by: As directed       Allergies as of 01/15/2022   No Known Allergies      Medication List     STOP taking these medications    amoxicillin-clavulanate 875-125 MG tablet Commonly known as: AUGMENTIN       TAKE these medications    amLODipine-olmesartan 5-20 MG tablet Commonly known as: AZOR Take 1 tablet by mouth daily.   ascorbic acid 500 MG tablet Commonly known as: VITAMIN C Take 1 tablet (500 mg total) by mouth daily.   ciprofloxacin 500 MG tablet Commonly known as: Cipro Take 1 tablet (500 mg total) by mouth 2 (two) times daily for 7 days. Start taking on: January 16, 2022   Eliquis 5 MG Tabs tablet Generic drug: apixaban Take 5 mg by mouth 2 (two) times daily.   fish oil-omega-3 fatty acids 1000 MG capsule SMARTSIG:1 Capsule(s) By Mouth Daily   glipiZIDE 2.5 MG 24 hr tablet Commonly known as: GLUCOTROL XL Take 1 tablet (2.5 mg total) by mouth daily with breakfast.   memantine 10 MG  tablet Commonly known as: NAMENDA Take 10 mg by mouth 2 (two) times daily.   Oxcarbazepine 300 MG tablet Commonly known as: TRILEPTAL Take 1 tablet (300 mg total) by mouth 2 (two) times daily.   QUEtiapine 50 MG tablet Commonly known as: SEROQUEL TAKE 1/2 TO 1 (ONE-HALF TO ONE) TABLET BY MOUTH ONCE DAILY AS NEEDED FOR  AGITATION   QUEtiapine 100 MG  tablet Commonly known as: SEROQUEL Take 1 tablet (100 mg total) by mouth at bedtime.   rosuvastatin 20 MG tablet Commonly known as: CRESTOR Take 20 mg by mouth daily.   vitamin B-12 1000 MCG tablet Commonly known as: CYANOCOBALAMIN Take 3,000 mcg by mouth daily.   Vitamin D3 25 MCG tablet Commonly known as: Vitamin D Take 1 tablet (1,000 Units total) by mouth daily.   zinc sulfate 220 (50 Zn) MG capsule Take 1 capsule (220 mg total) by mouth daily.        No Known Allergies  Consultations: None   Procedures/Studies: CT ABDOMEN PELVIS W CONTRAST  Result Date: 01/12/2022 CLINICAL DATA:  Right lower quadrant abdominal pain, sepsis EXAM: CT ABDOMEN AND PELVIS WITH CONTRAST TECHNIQUE: Multidetector CT imaging of the abdomen and pelvis was performed using the standard protocol following bolus administration of intravenous contrast. RADIATION DOSE REDUCTION: This exam was performed according to the departmental dose-optimization program which includes automated exposure control, adjustment of the mA and/or kV according to patient size and/or use of iterative reconstruction technique. CONTRAST:  49mL OMNIPAQUE IOHEXOL 300 MG/ML  SOLN COMPARISON:  01/05/2022 FINDINGS: Lower chest: Extensive right coronary artery calcification. Global cardiac size within normal limits. Visualized lung bases are clear. Hepatobiliary: No focal liver abnormality is seen. No gallstones, gallbladder wall thickening, or biliary dilatation. Pancreas: Unremarkable Spleen: Unremarkable Adrenals/Urinary Tract: The adrenal glands are unremarkable. The kidneys are normal in size and position. Previously noted right perinephric and periureteric inflammatory stranding has improved in the interval since prior examination. No perinephric fluid collections are seen. No hydronephrosis. No intrarenal or ureteral calculi. Foley catheter balloon is again seen within a decompressed bladder lumen. No perivesicular inflammatory  stranding or fluid collections are identified. Stomach/Bowel: Diverting distal transverse loop colostomy is again identified within the left mid abdomen. The stomach, small bowel, and large bowel are otherwise unremarkable. Appendix normal. No free intraperitoneal gas or fluid. Vascular/Lymphatic: Aortic atherosclerosis. No enlarged abdominal or pelvic lymph nodes. Reproductive: Prostate is unremarkable. Other: Small broad-based fat containing umbilical hernia. Musculoskeletal: Sacral decubitus ulcer is again identified extending to the sacrum with erosion of the sacrum and coccyx in keeping with changes of chronic osteomyelitis in this location. No associated drainable fluid collection is identified. No associated inflammatory changes noted extending into the presacral space. Changes of diffuse idiopathic skeletal hyperostosis noted within the lumbar spine. Superimposed degenerative changes are noted within the lumbar spine. In combination with congenital narrowing of the spinal canal, this results in multilevel severe central canal stenosis, most severe at T11-12, T12-L1, and L1-L2. Bilateral hip effusions are again identified, right greater than left. No acute bone abnormality. IMPRESSION: 1. No acute intra-abdominal pathology identified. No definite radiographic explanation for the patient's reported symptoms. 2. Interval improvement in right perinephric and periureteric inflammatory stranding. No perinephric fluid collections identified. No hydronephrosis. 3. Extensive right coronary artery calcification. 4. Sacral decubitus ulcer with evidence of chronic osteomyelitis involving the sacrum and coccyx. No associated drainable fluid collection. 5. Bilateral hip effusions, right greater than left. 6. Multifactorial severe central canal stenosis at the thoracolumbar junction. This  may be better assessed with dedicated MRI examination if indicated. Aortic Atherosclerosis (ICD10-I70.0). Electronically Signed   By:  Helyn NumbersAshesh  Parikh M.D.   On: 01/12/2022 23:05   DG Chest Port 1 View  Result Date: 01/12/2022 CLINICAL DATA:  Questionable sepsis. EXAM: PORTABLE CHEST 1 VIEW COMPARISON:  Chest radiograph dated 01/04/2022. FINDINGS: No focal consolidation, pleural effusion or pneumothorax. Borderline cardiomegaly. No acute osseous pathology. IMPRESSION: No active cardiopulmonary disease. Electronically Signed   By: Elgie CollardArash  Radparvar M.D.   On: 01/12/2022 22:15   CT ABDOMEN PELVIS W CONTRAST  Result Date: 01/05/2022 CLINICAL DATA:  Sepsis.  Confusion.  Fever. EXAM: CT ABDOMEN AND PELVIS WITH CONTRAST TECHNIQUE: Multidetector CT imaging of the abdomen and pelvis was performed using the standard protocol following bolus administration of intravenous contrast. RADIATION DOSE REDUCTION: This exam was performed according to the departmental dose-optimization program which includes automated exposure control, adjustment of the mA and/or kV according to patient size and/or use of iterative reconstruction technique. CONTRAST:  80mL OMNIPAQUE IOHEXOL 300 MG/ML  SOLN COMPARISON:  CT 10/06/2021 FINDINGS: Lower chest: No confluent airspace disease or pleural effusion. There are coronary artery calcifications. Hepatobiliary: Tiny subcapsular hypodensity in the right hepatic lobe is unchanged, too small to characterize but likely cyst. Gallbladder physiologically distended, no calcified stone. No biliary dilatation. Pancreas: No ductal dilatation or inflammation. Spleen: Normal in size without focal abnormality. Adrenals/Urinary Tract: No adrenal nodule. Moderate right greater than left perinephric edema. Mild prominence of the right renal pelvis and slight ureteral hyperemia. Stranding adjacent to the right ureter. No renal calculi or focal lesion. Foley catheter in the bladder, mild bladder wall thickening. Stomach/Bowel: Left-sided loop no bowel obstruction or inflammation. Normal appendix visualized. Redundant sigmoid colon. Colostomy.  Vascular/Lymphatic: Aortic and branch atherosclerosis. No aortic aneurysm. Prominent retroperitoneal lymph nodes, none of which are enlarged by size criteria. There prominent bilateral inguinal nodes, right greater than left. Calcification within the left common femoral vein. Reproductive: Prostate is unremarkable. Other: No ascites or free air. Small fat containing umbilical hernia. Musculoskeletal: Large sacral decubitus ulcer extends to bone with subjacent erosive change and truncation of the coccyx. There is a right hip joint effusion, increased in size from prior exam. Advanced bilateral hip osteoarthritis. Diffuse degenerative change in the lumbar spine. Paraspinal muscle fatty atrophy. IMPRESSION: 1. Moderate right greater than left perinephric edema with prominence of the right renal pelvis and slight ureteral hyperemia. Findings may be due to urinary tract infection. Recommend correlation with urinalysis. 2. Large sacral decubitus ulcer extends to bone with subjacent erosive change and truncation of the coccyx, suspicious for chronic osteomyelitis. No significant change from April. 3. Right hip joint effusion, slightly increased in size from prior exam. 4. Prominent bilateral inguinal nodes, right greater than left, likely reactive. Aortic Atherosclerosis (ICD10-I70.0). Electronically Signed   By: Narda RutherfordMelanie  Sanford M.D.   On: 01/05/2022 02:13   CT HEAD WO CONTRAST (5MM)  Result Date: 01/05/2022 CLINICAL DATA:  Mental status change, unknown cause EXAM: CT HEAD WITHOUT CONTRAST TECHNIQUE: Contiguous axial images were obtained from the base of the skull through the vertex without intravenous contrast. RADIATION DOSE REDUCTION: This exam was performed according to the departmental dose-optimization program which includes automated exposure control, adjustment of the mA and/or kV according to patient size and/or use of iterative reconstruction technique. COMPARISON:  Head CT 12/16/2021 FINDINGS: Brain: No  intracranial hemorrhage, mass effect, or midline shift. Stable degree of atrophy and chronic small vessel ischemia. No hydrocephalus. The basilar cisterns are patent. No  evidence of territorial infarct or acute ischemia. No extra-axial or intracranial fluid collection. Vascular: Atherosclerosis of skullbase vasculature without hyperdense vessel or abnormal calcification. Skull: No fracture or focal lesion. Sinuses/Orbits: Paranasal sinuses and mastoid air cells are clear. The visualized orbits are unremarkable. Other: None. IMPRESSION: 1. No acute intracranial abnormality. 2. Stable atrophy and chronic small vessel ischemia. Electronically Signed   By: Keith Rake M.D.   On: 01/05/2022 02:03   DG Chest Port 1 View  Result Date: 01/04/2022 CLINICAL DATA:  Fevers EXAM: PORTABLE CHEST 1 VIEW COMPARISON:  12/16/2021 FINDINGS: Cardiac shadow is within normal limits. Lungs are well aerated bilaterally. No focal infiltrate or effusion is seen. No bony abnormality is noted. IMPRESSION: No active disease. Electronically Signed   By: Inez Catalina M.D.   On: 01/04/2022 23:16      Subjective: And examined on the day of discharge.  Stable no distress.  Hemodynamically stable.  No fevers.  Stable for discharge home.  Discharge Exam: Vitals:   01/15/22 0458 01/15/22 0833  BP: 118/68 (!) 146/76  Pulse: 64 66  Resp: 14 18  Temp: 98 F (36.7 C) 97.8 F (36.6 C)  SpO2: 100% 100%   Vitals:   01/14/22 1522 01/14/22 1950 01/15/22 0458 01/15/22 0833  BP:  137/79 118/68 (!) 146/76  Pulse:  68 64 66  Resp: 15 18 14 18   Temp:  97.8 F (36.6 C) 98 F (36.7 C) 97.8 F (36.6 C)  TempSrc:      SpO2:  100% 100% 100%    General: Pt is alert, awake, not in acute distress Cardiovascular: RRR, S1/S2 +, no rubs, no gallops Respiratory: CTA bilaterally, no wheezing, no rhonchi Abdominal: Soft, NT, ND, bowel sounds + Extremities: no edema, no cyanosis    The results of significant diagnostics from this  hospitalization (including imaging, microbiology, ancillary and laboratory) are listed below for reference.     Microbiology: Recent Results (from the past 240 hour(s))  Blood Culture (routine x 2)     Status: None (Preliminary result)   Collection Time: 01/12/22  9:43 PM   Specimen: BLOOD  Result Value Ref Range Status   Specimen Description BLOOD BLOOD RIGHT FOREARM  Final   Special Requests   Final    BOTTLES DRAWN AEROBIC AND ANAEROBIC Blood Culture adequate volume   Culture   Final    NO GROWTH 3 DAYS Performed at [facility], [address] [place], [place] [address]    Report Status PENDING  Incomplete  Blood Culture (routine x 2)     Status: None (Preliminary result)   Collection Time: 01/12/22  9:57 PM   Specimen: BLOOD  Result Value Ref Range Status   Specimen Description BLOOD BLOOD RIGHT HAND  Final   Special Requests   Final    BOTTLES DRAWN AEROBIC AND ANAEROBIC Blood Culture results may not be optimal due to an inadequate volume of blood received in culture bottles   Culture   Final    NO GROWTH 3 DAYS Performed at [facility], [address] [place], [place] [address]    Report Status PENDING  Incomplete  Urine Culture     Status: Abnormal   Collection Time: 01/12/22 11:42 PM   Specimen: Urine, Random  Result Value Ref Range Status   Specimen Description   Final    URINE, RANDOM Performed at [facility], [address], [place], [place] [address]    Special Requests   Final  NONE Performed at [facility], [address] [place], [place] [address]    Culture 20,000 COLONIES/mL PSEUDOMONAS AERUGINOSA (A)  Final   Report Status 01/15/2022 FINAL  Final   Organism ID, Bacteria PSEUDOMONAS AERUGINOSA (A)  Final      Susceptibility   Pseudomonas aeruginosa - MIC*    CEFTAZIDIME 16 INTERMEDIATE Intermediate     CIPROFLOXACIN 1 INTERMEDIATE Intermediate     GENTAMICIN 8 INTERMEDIATE Intermediate     IMIPENEM 2  SENSITIVE Sensitive     * 20,000 COLONIES/mL PSEUDOMONAS AERUGINOSA  Resp Panel by RT-PCR (Flu A&B, Covid) Anterior Nasal Swab     Status: None   Collection Time: 01/13/22  8:30 AM   Specimen: Anterior Nasal Swab  Result Value Ref Range Status   SARS Coronavirus 2 by RT PCR NEGATIVE NEGATIVE Final    Comment: (NOTE) SARS-CoV-2 target nucleic acids are NOT DETECTED.  The SARS-CoV-2 RNA is generally detectable in upper respiratory specimens during the acute phase of infection. The lowest concentration of SARS-CoV-2 viral copies this assay can detect is 138 copies/mL. A negative result does not preclude SARS-Cov-2 infection and should not be used as the sole basis for treatment or other patient management decisions. A negative result may occur with  improper specimen collection/handling, submission of specimen other than nasopharyngeal swab, presence of viral mutation(s) within the areas targeted by this assay, and inadequate number of viral copies(<138 copies/mL). A negative result must be combined with clinical observations, patient history, and epidemiological information. The expected result is Negative.  Fact Sheet for Patients:  BloggerCourse.com  Fact Sheet for Healthcare Providers:  SeriousBroker.it  This test is no t yet approved or cleared by the [place] FDA and  has been authorized for detection and/or diagnosis of SARS-CoV-2 by FDA under an Emergency Use Authorization (EUA). This EUA will remain  in effect (meaning this test can be used) for the duration of the COVID-19 declaration under Section 564(b)(1) of the Act, 21 U.S.C.section 360bbb-3(b)(1), unless the authorization is terminated  or revoked sooner.       Influenza A by PCR NEGATIVE NEGATIVE Final   Influenza B by PCR NEGATIVE NEGATIVE Final    Comment: (NOTE) The Xpert Xpress SARS-CoV-2/FLU/RSV plus assay is intended as an aid in the diagnosis of  influenza from Nasopharyngeal swab specimens and should not be used as a sole basis for treatment. Nasal washings and aspirates are unacceptable for Xpert Xpress SARS-CoV-2/FLU/RSV testing.  Fact Sheet for Patients: BloggerCourse.com  Fact Sheet for Healthcare Providers: SeriousBroker.it  This test is not yet approved or cleared by the [place] FDA and has been authorized for detection and/or diagnosis of SARS-CoV-2 by FDA under an Emergency Use Authorization (EUA). This EUA will remain in effect (meaning this test can be used) for the duration of the COVID-19 declaration under Section 564(b)(1) of the Act, 21 U.S.C. section 360bbb-3(b)(1), unless the authorization is terminated or revoked.  Performed at [facility], [address] [place], [place] [address]      Labs: BNP (last 3 results) Recent Labs    07/28/21 0426  BNP 22.3   Basic Metabolic Panel: Recent Labs  Lab 01/12/22 2143 01/13/22 0554 01/14/22 0900  NA 133* 134* 134*  K 4.7 4.6 5.3*  CL 106 109 111  CO2 17* 19* 17*  GLUCOSE 188* 136* 111*  BUN 26* 24* 21  CREATININE 1.78* 1.92* 1.60*  CALCIUM 8.1* 8.2* 8.3*   Liver Function Tests: Recent Labs  Lab 01/12/22  2143  AST 19  ALT 24  ALKPHOS 85  BILITOT 0.5  PROT 8.4*  ALBUMIN 3.0*   No results for input(s): "LIPASE", "AMYLASE" in the last 168 hours. No results for input(s): "AMMONIA" in the last 168 hours. CBC: Recent Labs  Lab 01/12/22 2143 01/13/22 0554 01/14/22 0900  WBC 15.1* 14.9* 10.0  NEUTROABS 13.1*  --  7.6  HGB 8.5* 8.2* 8.4*  HCT 27.9* 27.5* 27.5*  MCV 87.7 88.7 86.5  PLT 296 275 271   Cardiac Enzymes: No results for input(s): "CKTOTAL", "CKMB", "CKMBINDEX", "TROPONINI" in the last 168 hours. BNP: Invalid input(s): "POCBNP" CBG: Recent Labs  Lab 01/14/22 1129 01/14/22 1748 01/14/22 2102 01/15/22 0806 01/15/22 1137  GLUCAP 144* 121* 120* 105* 113*    D-Dimer No results for input(s): "DDIMER" in the last 72 hours. Hgb A1c No results for input(s): "HGBA1C" in the last 72 hours. Lipid Profile No results for input(s): "CHOL", "HDL", "LDLCALC", "TRIG", "CHOLHDL", "LDLDIRECT" in the last 72 hours. Thyroid function studies No results for input(s): "TSH", "T4TOTAL", "T3FREE", "THYROIDAB" in the last 72 hours.  Invalid input(s): "FREET3" Anemia work up No results for input(s): "VITAMINB12", "FOLATE", "FERRITIN", "TIBC", "IRON", "RETICCTPCT" in the last 72 hours. Urinalysis    Component Value Date/Time   COLORURINE YELLOW (A) 01/12/2022 2342   APPEARANCEUR HAZY (A) 01/12/2022 2342   APPEARANCEUR Cloudy (A) 08/09/2021 1559   LABSPEC 1.017 01/12/2022 2342   PHURINE 5.0 01/12/2022 2342   GLUCOSEU NEGATIVE 01/12/2022 2342   HGBUR MODERATE (A) 01/12/2022 2342   BILIRUBINUR NEGATIVE 01/12/2022 2342   BILIRUBINUR Negative 08/09/2021 1559   KETONESUR NEGATIVE 01/12/2022 2342   PROTEINUR >=300 (A) 01/12/2022 2342   NITRITE NEGATIVE 01/12/2022 2342   LEUKOCYTESUR LARGE (A) 01/12/2022 2342   Sepsis Labs Recent Labs  Lab 01/12/22 2143 01/13/22 0554 01/14/22 0900  WBC 15.1* 14.9* 10.0   Microbiology Recent Results (from the past 240 hour(s))  Blood Culture (routine x 2)     Status: None (Preliminary result)   Collection Time: 01/12/22  9:43 PM   Specimen: BLOOD  Result Value Ref Range Status   Specimen Description BLOOD BLOOD RIGHT FOREARM  Final   Special Requests   Final    BOTTLES DRAWN AEROBIC AND ANAEROBIC Blood Culture adequate volume   Culture   Final    NO GROWTH 3 DAYS Performed at [facility], [address] [place], [place] [address]    Report Status PENDING  Incomplete  Blood Culture (routine x 2)     Status: None (Preliminary result)   Collection Time: 01/12/22  9:57 PM   Specimen: BLOOD  Result Value Ref Range Status   Specimen Description BLOOD BLOOD RIGHT HAND  Final   Special Requests   Final     BOTTLES DRAWN AEROBIC AND ANAEROBIC Blood Culture results may not be optimal due to an inadequate volume of blood received in culture bottles   Culture   Final    NO GROWTH 3 DAYS Performed at [facility], [address] [place], [place] [address]    Report Status PENDING  Incomplete  Urine Culture     Status: Abnormal   Collection Time: 01/12/22 11:42 PM   Specimen: Urine, Random  Result Value Ref Range Status   Specimen Description   Final    URINE, RANDOM Performed at [facility], [address], [place],  [address]    Special Requests   Final    NONE Performed at [facility],  [place], [place] [address]    Culture 20,000 COLONIES/mL PSEUDOMONAS AERUGINOSA (A)  Final   Report Status 01/15/2022 FINAL  Final   Organism ID, Bacteria PSEUDOMONAS AERUGINOSA (A)  Final      Susceptibility   Pseudomonas aeruginosa - MIC*    CEFTAZIDIME 16 INTERMEDIATE Intermediate     CIPROFLOXACIN 1 INTERMEDIATE Intermediate     GENTAMICIN 8 INTERMEDIATE Intermediate     IMIPENEM 2 SENSITIVE Sensitive     * 20,000 COLONIES/mL PSEUDOMONAS AERUGINOSA  Resp Panel by RT-PCR (Flu A&B, Covid) Anterior Nasal Swab     Status: None   Collection Time: 01/13/22  8:30 AM   Specimen: Anterior Nasal Swab  Result Value Ref Range Status   SARS Coronavirus 2 by RT PCR NEGATIVE NEGATIVE Final    Comment: (NOTE) SARS-CoV-2 target nucleic acids are NOT DETECTED.  The SARS-CoV-2 RNA is generally detectable in upper respiratory specimens during the acute phase of infection. The lowest concentration of SARS-CoV-2 viral copies this assay can detect is 138 copies/mL. A negative result does not preclude SARS-Cov-2 infection and should not be used as the sole basis for treatment or other patient management decisions. A negative result may occur with  improper specimen collection/handling, submission of specimen other than nasopharyngeal swab, presence of viral  mutation(s) within the areas targeted by this assay, and inadequate number of viral copies(<138 copies/mL). A negative result must be combined with clinical observations, patient history, and epidemiological information. The expected result is Negative.  Fact Sheet for Patients:  EntrepreneurPulse.com.au  Fact Sheet for Healthcare Providers:  IncredibleEmployment.be  This test is no t yet approved or cleared by the [place] FDA and  has been authorized for detection and/or diagnosis of SARS-CoV-2 by FDA under an Emergency Use Authorization (EUA). This EUA will remain  in effect (meaning this test can be used) for the duration of the COVID-19 declaration under Section 564(b)(1) of the Act, 21 U.S.C.section 360bbb-3(b)(1), unless the authorization is terminated  or revoked sooner.       Influenza A by PCR NEGATIVE NEGATIVE Final   Influenza B by PCR NEGATIVE NEGATIVE Final    Comment: (NOTE) The Xpert Xpress SARS-CoV-2/FLU/RSV plus assay is intended as an aid in the diagnosis of influenza from Nasopharyngeal swab specimens and should not be used as a sole basis for treatment. Nasal washings and aspirates are unacceptable for Xpert Xpress SARS-CoV-2/FLU/RSV testing.  Fact Sheet for Patients: EntrepreneurPulse.com.au  Fact Sheet for Healthcare Providers: IncredibleEmployment.be  This test is not yet approved or cleared by the [place] FDA and has been authorized for detection and/or diagnosis of SARS-CoV-2 by FDA under an Emergency Use Authorization (EUA). This EUA will remain in effect (meaning this test can be used) for the duration of the COVID-19 declaration under Section 564(b)(1) of the Act, 21 U.S.C. section 360bbb-3(b)(1), unless the authorization is terminated or revoked.  Performed at [facility], [address] [place], [place] [address]      Time coordinating  discharge: Over 30 minutes  SIGNED:   Sidney Ace, MD  [facility] 01/15/2022, 3:25 PM Pager   If 7PM-7AM, please contact night-coverage

## 2022-01-17 ENCOUNTER — Ambulatory Visit (INDEPENDENT_AMBULATORY_CARE_PROVIDER_SITE_OTHER): Payer: Medicare Other | Admitting: Physician Assistant

## 2022-01-17 DIAGNOSIS — N390 Urinary tract infection, site not specified: Secondary | ICD-10-CM | POA: Diagnosis not present

## 2022-01-17 DIAGNOSIS — Z466 Encounter for fitting and adjustment of urinary device: Secondary | ICD-10-CM

## 2022-01-17 DIAGNOSIS — A419 Sepsis, unspecified organism: Secondary | ICD-10-CM | POA: Diagnosis not present

## 2022-01-17 DIAGNOSIS — R339 Retention of urine, unspecified: Secondary | ICD-10-CM | POA: Diagnosis not present

## 2022-01-17 LAB — CULTURE, BLOOD (ROUTINE X 2)
Culture: NO GROWTH
Culture: NO GROWTH
Special Requests: ADEQUATE

## 2022-01-17 NOTE — Progress Notes (Signed)
01/17/2022 10:00 AM   [name] Apr 25, 1956 [id number]  CC: Chief Complaint  Patient presents with   Urinary Retention   HPI: [name] is a 66 y.o. 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    Urine culture during his first admission grew multiple species, blood cultures positive for E. coli.  He was treated with culture appropriate antibiotics. Most recent urine culture dated 01/12/2022 during readmission grew low colony counts of ceftazidime, Cipro, and gentamicin intermediate Pseudomonas aeruginosa.  This finding was felt to be a contaminant, but he was discharged on 10 days of Cipro regardless.  He remains on Cipro today.  Today he reports feeling well with no acute concerns.  He states he is taking his Cipro as prescribed.  Most recent catheter change was approximately 2 weeks ago in the hospital.  PMH: Past Medical History:  Diagnosis Date   Bipolar disorder (HCC)    Diabetes mellitus without complication (HCC)    History of blood clots    Hypertension    TBI (traumatic brain injury) (HCC)     Surgical History: Past Surgical History:  Procedure Laterality Date   BACK SURGERY     CARPAL TUNNEL RELEASE Bilateral    COLON SURGERY     COLOSTOMY     IR PERC TUN PERIT CATH WO PORT S&I /IMAG  10/11/2021   IR REMOVAL TUN CV CATH W/O FL  11/19/2021   IR US GUIDE VASC ACCESS RIGHT  10/11/2021   TONSILLECTOMY      Home Medications:  Allergies as of 01/17/2022   No Known Allergies      Medication List        Accurate as of January 17, 2022 10:00 AM. If you have any questions, ask your nurse or doctor.          amLODipine-olmesartan 5-20 MG tablet Commonly known as: AZOR Take 1 tablet by mouth daily.   ascorbic acid 500 MG tablet Commonly known as: VITAMIN C Take 1 tablet (500  mg total) by mouth daily.   ciprofloxacin 500 MG tablet Commonly known as: Cipro Take 1 tablet (500 mg total) by mouth 2 (two) times daily for 7 days.   Eliquis 5 MG Tabs tablet Generic drug: apixaban Take 5 mg by mouth 2 (two) times daily.   fish oil-omega-3 fatty acids 1000 MG capsule SMARTSIG:1 Capsule(s) By Mouth Daily   glipiZIDE 2.5 MG 24 hr tablet Commonly known as: GLUCOTROL XL Take 1 tablet (2.5 mg total) by mouth daily with breakfast.   memantine 10 MG tablet Commonly known as: NAMENDA Take 10 mg by mouth 2 (two) times daily.   Oxcarbazepine 300 MG tablet Commonly known as: TRILEPTAL Take 1 tablet (300 mg total) by mouth 2 (two) times daily.   QUEtiapine 50 MG tablet Commonly known as: SEROQUEL TAKE 1/2 TO 1 (ONE-HALF TO ONE) TABLET BY MOUTH ONCE DAILY AS NEEDED FOR  AGITATION   QUEtiapine 100 MG tablet Commonly known as: SEROQUEL Take 1 tablet (100 mg total) by mouth at bedtime.   rosuvastatin 20 MG tablet Commonly known as: CRESTOR Take 20 mg by mouth daily.   vitamin B-12 1000 MCG tablet Commonly known as: CYANOCOBALAMIN Take 3,000 mcg by mouth daily.   Vitamin D3 25 MCG tablet Commonly known as: Vitamin D Take 1 tablet (1,000 Units total) by mouth daily.   zinc  sulfate 220 (50 Zn) MG capsule Take 1 capsule (220 mg total) by mouth daily.        Allergies:  No Known Allergies  Family History: Family History  Problem Relation Age of Onset   Depression Father    Deep vein thrombosis Father     Social History:   reports that he has never smoked. He has never been exposed to tobacco smoke. He has never used smokeless tobacco. He reports current alcohol use. He reports that he does not use drugs.  Physical Exam: There were no vitals taken for this visit.  Constitutional:  Alert and oriented, no acute distress, nontoxic appearing HEENT: [place], AT Cardiovascular: No clubbing, cyanosis, or edema Respiratory: Normal respiratory effort, no  increased work of breathing Skin: No rashes, bruises or suspicious lesions Neurologic: Grossly intact, no focal deficits, moving all 4 extremities Psychiatric: Normal mood and affect  Cath Change/ Replacement  Patient is present today for a catheter change due to urinary retention.  45ml of water was removed from the balloon, a 16FR foley cath was removed without difficulty.  Patient was cleaned and prepped in a sterile fashion with betadine and 2% lidocaine jelly was instilled into the urethra. A 16 FR Silastic foley cath was replaced into the bladder no complications were noted Urine return was noted 56ml and urine was yellow in color. The balloon was filled with 38ml of sterile water. A night bag was attached for drainage.  Patient tolerated well.    Performed by: [name], PA-C   Assessment & Plan:   1. Sepsis secondary to UTI [facility]) On culture appropriate antibiotics, agree with prolonged course given intermediate susceptibility on culture and recurrent versus persistent illness.  Counseled him to complete Cipro as prescribed.  2. Urinary catheter (Foley) change required Changed while on Cipro for UTI prevention, procedure note as above.  Return in about 4 weeks (around 02/14/2022) for Catheter exchange.  [name], PA-C  [facility] [address], [address] [place], [place] [address] [phone]

## 2022-01-30 ENCOUNTER — Ambulatory Visit: Payer: Medicare Other | Admitting: Urology

## 2022-02-01 ENCOUNTER — Encounter: Payer: Medicare Other | Attending: Physician Assistant | Admitting: Physician Assistant

## 2022-02-01 DIAGNOSIS — Z7901 Long term (current) use of anticoagulants: Secondary | ICD-10-CM | POA: Diagnosis not present

## 2022-02-01 DIAGNOSIS — I10 Essential (primary) hypertension: Secondary | ICD-10-CM | POA: Insufficient documentation

## 2022-02-01 DIAGNOSIS — L89613 Pressure ulcer of right heel, stage 3: Secondary | ICD-10-CM | POA: Insufficient documentation

## 2022-02-01 DIAGNOSIS — L89154 Pressure ulcer of sacral region, stage 4: Secondary | ICD-10-CM | POA: Diagnosis not present

## 2022-02-01 DIAGNOSIS — Z87820 Personal history of traumatic brain injury: Secondary | ICD-10-CM | POA: Insufficient documentation

## 2022-02-01 DIAGNOSIS — F319 Bipolar disorder, unspecified: Secondary | ICD-10-CM | POA: Insufficient documentation

## 2022-02-01 DIAGNOSIS — E11622 Type 2 diabetes mellitus with other skin ulcer: Secondary | ICD-10-CM | POA: Insufficient documentation

## 2022-02-01 DIAGNOSIS — L98412 Non-pressure chronic ulcer of buttock with fat layer exposed: Secondary | ICD-10-CM | POA: Insufficient documentation

## 2022-02-01 DIAGNOSIS — L89623 Pressure ulcer of left heel, stage 3: Secondary | ICD-10-CM | POA: Diagnosis not present

## 2022-02-01 DIAGNOSIS — L24A0 Irritant contact dermatitis due to friction or contact with body fluids, unspecified: Secondary | ICD-10-CM | POA: Diagnosis not present

## 2022-02-01 DIAGNOSIS — M6281 Muscle weakness (generalized): Secondary | ICD-10-CM | POA: Insufficient documentation

## 2022-02-01 NOTE — Progress Notes (Addendum)
MILEY, LINDON ([id number]) Visit Report for 02/01/2022 Arrival Information Details Patient Name: [name], [name] Date of Service: 02/01/2022 8:30 AM Medical Record Number: [id number] Patient Account Number: [id number] Date of Birth/Sex: April 01, 1956 (66 y.o. M) Treating RN: Angelina Pih Primary Care Kinesha Auten: Dione Booze Other Clinician: Referring Maliek Schellhorn: Dione Booze Treating Orrin Yurkovich/Extender: Rowan Blase in Treatment: 10 Visit Information History Since Last Visit Added or deleted any medications: Yes Patient Arrived: Wheel Chair Any new allergies or adverse reactions: No Arrival Time: 08:31 Had a fall or experienced change in No Accompanied By: wife activities of daily living that may affect Transfer Assistance: EasyPivot Patient Lift risk of falls: Patient Identification Verified: Yes Hospitalized since last visit: Yes Secondary Verification Process Completed: Yes Has Dressing in Place as Prescribed: Yes Patient Requires Transmission-Based No Pain Present Now: No Precautions: Patient Has Alerts: Yes Patient Alerts: Patient on Blood Thinner Electronic Signature(s) Signed: 02/01/2022 4:09:59 PM By: Angelina Pih Entered By: Angelina Pih on 02/01/2022 08:31:58 Tudor, [name] ([id number]) -------------------------------------------------------------------------------- Lower Extremity Assessment Details Patient Name: [name] Date of Service: 02/01/2022 8:30 AM Medical Record Number: [id number] Patient Account Number: [id number] Date of Birth/Sex: January 11, 1956 (66 y.o. M) Treating RN: Angelina Pih Primary Care Maureena Dabbs: Dione Booze Other Clinician: Referring Dhana Totton: Dione Booze Treating Yaremi Stahlman/Extender: Allen Derry Weeks in Treatment: 10 Edema Assessment Assessed: [Left: No] [Right: No] Edema: [Left: Yes] [Right: Yes] Vascular Assessment Pulses: Dorsalis Pedis Palpable: [Left:Yes]  [Right:Yes] Electronic Signature(s) Signed: 02/01/2022 4:09:59 PM By: Angelina Pih Entered By: Angelina Pih on 02/01/2022 08:46:28 Ende, [name] ([id number]) -------------------------------------------------------------------------------- Multi-Disciplinary Care Plan Details Patient Name: [name] Date of Service: 02/01/2022 8:30 AM Medical Record Number: [id number] Patient Account Number: [id number] Date of Birth/Sex: 1956/03/17 (66 y.o. M) Treating RN: Angelina Pih Primary Care Markea Ruzich: Dione Booze Other Clinician: Referring Hortencia Martire: Dione Booze Treating Arlon Bleier/Extender: Rowan Blase in Treatment: 10 Active Inactive Wound/Skin Impairment Nursing Diagnoses: Knowledge deficit related to ulceration/compromised skin integrity Goals: Patient/caregiver will verbalize understanding of skin care regimen Date Initiated: 11/22/2021 Target Resolution Date: 02/22/2022 Goal Status: Active Ulcer/skin breakdown will have a volume reduction of 30% by week 4 Date Initiated: 11/22/2021 Date Inactivated: 01/10/2022 Target Resolution Date: 12/23/2021 Goal Status: Unmet Unmet Reason: comorbities Ulcer/skin breakdown will have a volume reduction of 50% by week 8 Date Initiated: 11/22/2021 Target Resolution Date: 01/22/2022 Goal Status: Active Ulcer/skin breakdown will have a volume reduction of 80% by week 12 Date Initiated: 11/22/2021 Target Resolution Date: 02/22/2022 Goal Status: Active Ulcer/skin breakdown will heal within 14 weeks Date Initiated: 11/22/2021 Target Resolution Date: 03/25/2022 Goal Status: Active Interventions: Assess patient/caregiver ability to obtain necessary supplies Assess patient/caregiver ability to perform ulcer/skin care regimen upon admission and as needed Assess ulceration(s) every visit Notes: Electronic Signature(s) Signed: 02/01/2022 4:09:59 PM By: Angelina Pih Entered By: Angelina Pih on 02/01/2022  08:52:48 Purves, [name] ([id number]) -------------------------------------------------------------------------------- Pain Assessment Details Patient Name: [name] Date of Service: 02/01/2022 8:30 AM Medical Record Number: [id number] Patient Account Number: [id number] Date of Birth/Sex: 08-Feb-1956 (66 y.o. M) Treating RN: Angelina Pih Primary Care Tyannah Sane: Dione Booze Other Clinician: Referring Terressa Evola: Dione Booze Treating Quincey Nored/Extender: Rowan Blase in Treatment: 10 Active Problems Location of Pain Severity and Description of Pain Patient Has Paino No Site Locations Rate the pain. Current Pain Level: 0 Pain Management and Medication Current Pain Management: Electronic Signature(s) Signed: 02/01/2022 4:09:59 PM By: Angelina Pih Entered By: Angelina Pih on 02/01/2022 08:32:24 Folse, [name] ([id number]) -------------------------------------------------------------------------------- Wound Assessment Details Patient Name: [name] Date of Service: 02/01/2022 8:30 AM Medical Record Number:  [id number] Patient Account Number: [id number] Date of Birth/Sex: 08-08-1955 (66 y.o. M) Treating RN: Levora Dredge Primary Care Zaven Klemens: [name] Other Clinician: Referring Rahma Meller: [name] Treating Tamina Cyphers/Extender: Jeri Cos Weeks in Treatment: 10 Wound Status Wound Number: 5 Primary Pressure Ulcer Etiology: Wound Location: Right Calcaneus Wound Open Wounding Event: Pressure Injury Status: Date Acquired: 07/01/2021 Comorbid Anemia, Sleep Apnea, Coronary Artery Disease, Deep Weeks Of Treatment: 10 History: Vein Thrombosis, Hypertension, Type II Diabetes, History Clustered Wound: No of pressure wounds, Neuropathy Photos Wound Measurements Length: (cm) 1.9 Width: (cm) 2.6 Depth: (cm) 0.1 Area: (cm) 3.88 Volume: (cm) 0.388 % Reduction in Area: 6.3% % Reduction in Volume:  6.3% Epithelialization: None Tunneling: No Undermining: No Wound Description Classification: Category/Stage III Exudate Amount: Medium Exudate Type: Serosanguineous Exudate Color: red, brown Foul Odor After Cleansing: No Slough/Fibrino Yes Wound Bed Granulation Amount: Large (67-100%) Exposed Structure Granulation Quality: Red, Pink Fascia Exposed: No Necrotic Amount: Small (1-33%) Fat Layer (Subcutaneous Tissue) Exposed: Yes Necrotic Quality: Adherent Slough Tendon Exposed: No Muscle Exposed: No Joint Exposed: No Bone Exposed: No Electronic Signature(s) Signed: 02/01/2022 4:09:59 PM By: Levora Dredge Entered By: Levora Dredge on 02/01/2022 08:39:32 Zappia, [name] ([id number]) -------------------------------------------------------------------------------- Wound Assessment Details Patient Name: [name] Date of Service: 02/01/2022 8:30 AM Medical Record Number: [id number] Patient Account Number: [id number] Date of Birth/Sex: 1956/01/27 (66 y.o. M) Treating RN: Levora Dredge Primary Care Charan Prieto: [name] Other Clinician: Referring Kendel Pesnell: [name] Treating Malvina Schadler/Extender: Jeri Cos Weeks in Treatment: 10 Wound Status Wound Number: 6 Primary Pressure Ulcer Etiology: Wound Location: Left Calcaneus Wound Open Wounding Event: Pressure Injury Status: Date Acquired: 07/01/2021 Comorbid Anemia, Sleep Apnea, Coronary Artery Disease, Deep Weeks Of Treatment: 10 History: Vein Thrombosis, Hypertension, Type II Diabetes, History Clustered Wound: No of pressure wounds, Neuropathy Photos Wound Measurements Length: (cm) 0.8 Width: (cm) 2.3 Depth: (cm) 0.1 Area: (cm) 1.445 Volume: (cm) 0.145 % Reduction in Area: 79% % Reduction in Volume: 78.9% Epithelialization: None Tunneling: No Undermining: No Wound Description Classification: Unstageable/Unclassified Exudate Amount: Small Exudate Type: Serosanguineous Exudate Color:  red, brown Foul Odor After Cleansing: No Slough/Fibrino Yes Wound Bed Granulation Amount: Medium (34-66%) Exposed Structure Necrotic Amount: Medium (34-66%) Fascia Exposed: No Necrotic Quality: Eschar Fat Layer (Subcutaneous Tissue) Exposed: No Tendon Exposed: No Muscle Exposed: No Joint Exposed: No Bone Exposed: No Electronic Signature(s) Signed: 02/01/2022 4:09:59 PM By: Levora Dredge Entered By: Levora Dredge on 02/01/2022 08:40:02 Valeri, [name] ([id number]) -------------------------------------------------------------------------------- Wound Assessment Details Patient Name: [name] Date of Service: 02/01/2022 8:30 AM Medical Record Number: [id number] Patient Account Number: [id number] Date of Birth/Sex: 1955/08/20 (66 y.o. M) Treating RN: Levora Dredge Primary Care Antwan Bribiesca: [name] Other Clinician: Referring Kursten Kruk: [name] Treating Andersen Iorio/Extender: Jeri Cos Weeks in Treatment: 10 Wound Status Wound Number: 7 Primary Pressure Ulcer Etiology: Wound Location: Sacrum Wound Open Wounding Event: Pressure Injury Status: Date Acquired: 07/01/2021 Comorbid Anemia, Sleep Apnea, Coronary Artery Disease, Deep Weeks Of Treatment: 10 History: Vein Thrombosis, Hypertension, Type II Diabetes, History Clustered Wound: No of pressure wounds, Neuropathy Photos Wound Measurements Length: (cm) 2 Width: (cm) 2 Depth: (cm) 4.1 Area: (cm) 3.142 Volume: (cm) 12.881 % Reduction in Area: 0% % Reduction in Volume: -36.7% Epithelialization: None Tunneling: No Undermining: No Wound Description Classification: Category/Stage IV Exudate Amount: Medium Exudate Type: Serosanguineous Exudate Color: red, brown Foul Odor After Cleansing: No Slough/Fibrino Yes Wound Bed Granulation Amount: Large (67-100%) Exposed Structure Granulation Quality: Red Fascia Exposed: No Necrotic Amount: None Present (0%) Fat Layer (Subcutaneous Tissue)  Exposed: Yes Tendon Exposed:  No Muscle Exposed: No Joint Exposed: No Bone Exposed: No Assessment Notes surrounding skin appears macerated Electronic Signature(s) Signed: 02/01/2022 4:09:59 PM By: Angelina Pih Entered By: Angelina Pih on 02/01/2022 08:46:05 Horn, [name] ([id number]) -------------------------------------------------------------------------------- Vitals Details Patient Name: [name] Date of Service: 02/01/2022 8:30 AM Medical Record Number: [id number] Patient Account Number: [id number] Date of Birth/Sex: [birth year] (66 y.o. M) Treating RN: Angelina Pih Primary Care Sulo Janczak: Dione Booze Other Clinician: Referring Jenice Leiner: Dione Booze Treating Raffaella Edison/Extender: Rowan Blase in Treatment: 10 Vital Signs Time Taken: 08:32 Temperature (F): 97.5 Height (in): 66 Pulse (bpm): 103 Weight (lbs): 279 Respiratory Rate (breaths/min): 18 Body Mass Index (BMI): 45 Blood Pressure (mmHg): 163/91 Reference Range: 80 - 120 mg / dl Electronic Signature(s) Signed: 02/01/2022 4:09:59 PM By: Angelina Pih Entered By: Angelina Pih on 02/01/2022 08:32:17

## 2022-02-01 NOTE — Progress Notes (Addendum)
[name] ([id number]) Visit Report for 02/01/2022 Chief Complaint Document Details Patient Name: [name] Date of Service: 02/01/2022 8:30 AM Medical Record Number: [id number] Patient Account Number: [id number] Date of Birth/Sex: 1956/04/17 (65 y.o. M) Treating RN: Levora Dredge Primary Care Provider: [name] Other Clinician: Referring Provider: [name] Treating Provider/Extender: Skipper Cliche in Treatment: 10 Information Obtained from: Patient Chief Complaint Sacral, right gluteal, and bilateral heel ulcers Electronic Signature(s) Signed: 02/01/2022 8:29:29 AM By: [name] PA-C Entered By: [name] on 02/01/2022 08:29:29 [name], [name] ([id number]) -------------------------------------------------------------------------------- Debridement Details Patient Name: [name] Date of Service: 02/01/2022 8:30 AM Medical Record Number: [id number] Patient Account Number: [id number] Date of Birth/Sex: May 03, 1956 (65 y.o. M) Treating RN: Levora Dredge Primary Care Provider: [name] Other Clinician: Referring Provider: [name] Treating Provider/Extender: Skipper Cliche in Treatment: 10 Debridement Performed for Wound #5 Right Calcaneus Assessment: Performed By: Physician Tommie Sams., PA-C Debridement Type: Debridement Level of Consciousness (Pre- Awake and Alert procedure): Pre-procedure Verification/Time Out Yes - 08:50 Taken: Total Area Debrided (L x W): 1.9 (cm) x 2.6 (cm) = 4.94 (cm) Tissue and other material Non-Viable, Callus, Slough, Subcutaneous, Slough debrided: Level: Skin/Subcutaneous Tissue Debridement Description: Excisional Instrument: Curette Bleeding: Minimum Hemostasis Achieved: Pressure Response to Treatment: Procedure was tolerated well Level of Consciousness (Post- Awake and Alert procedure): Post Debridement Measurements of Total Wound Length: (cm)  1.9 Stage: Category/Stage III Width: (cm) 2.6 Depth: (cm) 0.1 Volume: (cm) 0.388 Character of Wound/Ulcer Post Debridement: Stable Post Procedure Diagnosis Same as Pre-procedure Electronic Signature(s) Signed: 02/01/2022 3:54:34 PM By: [name] PA-C Signed: 02/01/2022 4:09:59 PM By: Levora Dredge Entered By: Levora Dredge on 02/01/2022 08:51:10 [name], [name] ([id number]) -------------------------------------------------------------------------------- Debridement Details Patient Name: [name] Date of Service: 02/01/2022 8:30 AM Medical Record Number: [id number] Patient Account Number: [id number] Date of Birth/Sex: 12-05-1955 (65 y.o. M) Treating RN: Levora Dredge Primary Care Provider: [name] Other Clinician: Referring Provider: [name] Treating Provider/Extender: Skipper Cliche in Treatment: 10 Debridement Performed for Wound #6 Left Calcaneus Assessment: Performed By: Physician Tommie Sams., PA-C Debridement Type: Debridement Level of Consciousness (Pre- Awake and Alert procedure): Pre-procedure Verification/Time Out Yes - 08:50 Taken: Pain Control: Lidocaine 4% Topical Solution Total Area Debrided (L x W): 0.8 (cm) x 2.3 (cm) = 1.84 (cm) Tissue and other material Non-Viable, Callus, Slough, Subcutaneous, Slough debrided: Level: Skin/Subcutaneous Tissue Debridement Description: Excisional Instrument: Curette Bleeding: Minimum Hemostasis Achieved: Pressure Response to Treatment: Procedure was tolerated well Level of Consciousness (Post- Awake and Alert procedure): Post Debridement Measurements of Total Wound Length: (cm) 0.8 Stage: Unstageable/Unclassified Width: (cm) 2.3 Depth: (cm) 0.1 Volume: (cm) 0.145 Character of Wound/Ulcer Post Debridement: Stable Post Procedure Diagnosis Same as Pre-procedure Electronic Signature(s) Signed: 02/01/2022 3:54:34 PM By: [name] PA-C Signed: 02/01/2022 4:09:59  PM By: Levora Dredge Entered By: Levora Dredge on 02/01/2022 08:52:37 [name], [name] ([id number]) -------------------------------------------------------------------------------- HPI Details Patient Name: [name] Date of Service: 02/01/2022 8:30 AM Medical Record Number: [id number] Patient Account Number: [id number] Date of Birth/Sex: 1956-04-18 (65 y.o. M) Treating RN: Levora Dredge Primary Care Provider: [name] Other Clinician: Referring Provider: [name] Treating Provider/Extender: Skipper Cliche in Treatment: 10 History of Present Illness HPI Description: 05/29/2021 this is a patient who presents today for initial inspection here in the clinic concerning wounds that he has over the right heel and the sacral region. Unfortunately the sacral wound is starting to spread off to the right gluteal location due to how he sits always  leaning towards the right side in his chair. His wife is present she is the primary caregiver though she is not home with him all the time she does have to work. She does do an excellent job however it appears to be in trying to keep things under good control for him. The patient is not able to change positions himself nor walk by himself so he is pretty much at the mercy of the position he is putting when she is gone and this tends to be his chair which she sits and most of the day. Obviously this I think is the main culprit for what is going on currently. It was actually in January 2020 when the sacral wound started. It was in September 2022 when the wound started to spread more to the right gluteal location. Subsequently in August 2022 is when he had been in a skilled nursing facility and the heel started to give him trouble as well. That does not seem to be doing nearly as poorly as the sacral region. He was hospitalized in October 2022 secondary to sepsis and this was in regard to the foot and was sent to skilled  nursing again he is now back at home. He did have a wound VAC for the sacral wound over the summer 2022 but being in and out of facilities this ended up getting sent back. The patient does have [facility] home health that comes out 1 time per week to help with care. His most recent hemoglobin A1c was 6.9 in August 2022. Patient's met past medical history includes generalized muscle weakness, bipolar disorder, diabetes mellitus type 2, hypertension, long-term use of anticoagulant therapy due to frequent blood clots/DVTs. He also has a history of traumatic brain injury. 07/24/2021 upon evaluation today patient appears to be doing decently well in regard to the pressure ulcer on the right heel as well as the sacral region. In general I think you are making some progress here which is great news. Overall the heel unfortunately had already closed previously when we saw him although it apparently reopened when he was working with physical therapy according to his wife. The area in the sacral region is doing well and looks clean there is still some depth here but I still think it would be difficult to wound VAC this region. His wife does an awesome job taking care of him. Is been so long since we have seen him because he has been in the hospital to be honest. 08/07/2021 upon evaluation today patient appears to be doing well at this time. Fortunately I do not see any signs of active infection locally or systemically at this time which is great news. No fevers, chills, nausea, vomiting, or diarrhea. Unfortunately after I saw him on the 24th he actually ended up in the hospital in the 27th due to being septic. This was not due to the wounds but after looking at records actually due to a UTI. Fortunately he is doing much better and very happy in that regard. I do not see any signs of infection locally nor systemically at this time. 08/21/2021 upon evaluation today patient appears to be doing well with regard to his  wound. Fortunately I think that the sacral region is doing decently well at this point which is great news. With regard to the foot this also does have some slough noted but I feel like we are making progress here. He does have some irritation around the right upper thigh/gluteal region. I  feel like it is more towards the thigh. Nonetheless this does appear to be pressure related he spends a lot of time sitting up his caregiver states he really will not get in the bed and stay there like he should. 09/04/2021 upon evaluation patient appears to be doing decently well today in regard to the wound on his heel as well as the sacral area. I am actually very pleased with both and how things are appearing currently. There does not appear to be any evidence of active infection I think that his caregiver is doing an awesome job with regard to the wound care here. She is present today as well and I did discuss this with her. 09/25/2021 upon evaluation today patient appears to be doing well with regard to his wound on the sacral region. His right heel is also doing well. Unfortunately he has a new area on his left heel which does appear to be pressure injury. I do not see any evidence of active infection locally or systemically which is great news no fevers, chills, nausea, vomiting, or diarrhea. Readmission: 11-22-2021 upon evaluation today patient presents for reevaluation here in the clinic concerning issues with his wounds. Since have last seen him he was in the hospital and then subsequently ended up in a skilled nursing facility. Subsequently period of time in the facility he actually had a breakdown in the right thigh location which has been an issue for him. Fortunately I do not see any evidence of active infection locally or systemically which is great news and I am very pleased in that regard. Nonetheless he does have still the wounds on both heels as well as the wound in the sacral area and the new area  in the right upper thigh/gluteal region. 12-13-2021 upon evaluation today patient appears to be doing well currently in regard to his wounds. The left heel is going to require some sharp debridement. Fortunately the right heel seems to be doing quite well. Overall I think his gluteal region as well as sacral region also are doing well. 01-10-2022 upon evaluation today patient appears to be doing better in regard to some areas unfortunately his sacral area is not doing as well. It seems of gotten worse his caregiver states when he was in the hospital recently they really did not offloading. Fortunately I do not see any evidence of active infection locally or systemically at this time. 02-01-2022 upon evaluation today patient appears to be doing well currently in regard to his wounds. He is going require some sharp debridement of clearway some of the necrotic debris. Fortunately I do not see any evidence of active infection locally or systemically which is great news. No fevers, chills, nausea, vomiting, or diarrhea. Electronic Signature(s) Signed: 02/01/2022 9:49:13 AM By: Irean Hong Flenniken, Homer ([id number]) Entered By: [name] on 02/01/2022 09:49:13 [name], [name] ([id number]) -------------------------------------------------------------------------------- Physical Exam Details Patient Name: [name] Date of Service: 02/01/2022 8:30 AM Medical Record Number: [id number] Patient Account Number: [id number] Date of Birth/Sex: 06-14-1956 (65 y.o. M) Treating RN: Levora Dredge Primary Care Provider: [name] Other Clinician: Referring Provider: [name] Treating Provider/Extender: Skipper Cliche in Treatment: 74 Constitutional Well-nourished and well-hydrated in no acute distress. Respiratory normal breathing without difficulty. Psychiatric this patient is able to make decisions and demonstrates good insight into disease process.  Alert and Oriented x 3. pleasant and cooperative. Notes Upon inspection patient's wound bed actually showed signs of some need for sharp debridement in regard to  the heels. He tolerated the debridement today without complication and postdebridement wound bed appears to be doing significantly better which is great news. Electronic Signature(s) Signed: 02/01/2022 9:49:26 AM By: [name] PA-C Entered By: [name] on 02/01/2022 09:49:26 [name], [name] ([id number]) -------------------------------------------------------------------------------- Physician Orders Details Patient Name: [name] Date of Service: 02/01/2022 8:30 AM Medical Record Number: [id number] Patient Account Number: [id number] Date of Birth/Sex: Jun 19, 1956 (65 y.o. M) Treating RN: Levora Dredge Primary Care Provider: [name] Other Clinician: Referring Provider: [name] Treating Provider/Extender: Skipper Cliche in Treatment: 10 Verbal / Phone Orders: No Diagnosis Coding ICD-10 Coding Code Description L89.613 Pressure ulcer of right heel, stage 3 L89.623 Pressure ulcer of left heel, stage 3 L89.154 Pressure ulcer of sacral region, stage 4 L24.A0 Irritant contact dermatitis due to friction or contact with body fluids, unspecified L98.412 Non-pressure chronic ulcer of buttock with fat layer exposed M62.81 Muscle weakness (generalized) F31.9 Bipolar disorder, unspecified E11.622 Type 2 diabetes mellitus with other skin ulcer I10 Essential (primary) hypertension Z79.01 Long term (current) use of anticoagulants 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 Follow-up Appointments o Return Appointment in 3 weeks. Ronald for wound care. May utilize formulary equivalent dressing for wound treatment orders unless otherwise specified. Home Health Nurse may visit PRN to address patientos wound care needs. Kathaleen Bury phone [phone]  fax [phone] o Scheduled days for dressing changes to be completed; exception, patient has scheduled wound care visit that day. o **Please direct any NON-WOUND related issues/requests for orders to patient's Primary Care Physician. **If current dressing causes regression in wound condition, may D/C ordered dressing product/s and apply Normal Saline Moist Dressing daily until next [facility] or Other MD appointment. **Notify [facility] of regression in wound condition at [phone]. Bathing/ Shower/ Hygiene o May shower with wound dressing protected with water repellent cover or cast protector. o No tub bath. Anesthetic (Use 'Patient Medications' Section for Anesthetic Order Entry) o Lidocaine applied to wound bed Edema Control - Lymphedema / Segmental Compressive Device / Other o Elevate, Exercise Daily and Avoid Standing for Long Periods of Time. o Elevate legs to the level of the heart and pump ankles as often as possible o Elevate leg(s) parallel to the floor when sitting. o DO YOUR BEST to sleep in the bed at night. DO NOT sleep in your recliner. Long hours of sitting in a recliner leads to swelling of the legs and/or potential wounds on your backside. Off-Loading o Gel wheelchair cushion o Low air-loss mattress (Group 2) o Turn and reposition every 2 hours - keep pressure off of the sacrum and heels wounds o Other: - PRAFO boot in bed keep pressure off of sacrum/gluteus and heels- Additional Orders / Instructions o Follow Nutritious Diet and Increase Protein Intake Wound Treatment Wound #5 - Calcaneus Wound Laterality: Right [name], [name] ([id number]) Primary Dressing: Hydrofera Blue Ready Transfer Foam, 2.5x2.5 (in/in) 3 x Per Week/30 Days Discharge Instructions: Apply Hydrofera Blue Ready to wound bed as directed Secondary Dressing: (SILCONE BORDER) Zetuvit Plus SILICONE BORDER Dressing 5x5 (in/in) 3 x Per Week/30  Days Discharge Instructions: Please do not put silicone bordered dressings under wraps. Use non-bordered dressing only. Wound #6 - Calcaneus Wound Laterality: Left Primary Dressing: Hydrofera Blue Ready Transfer Foam, 2.5x2.5 (in/in) 1 x Per Day/30 Days Discharge Instructions: Apply Hydrofera Blue Ready to wound bed as directed Secondary Dressing: (SILICONE BORDER) Zetuvit Plus SILICONE BORDER Dressing 4x4 (in/in) 1 x Per Day/30  Days Discharge Instructions: Please do not put silicone bordered dressings under wraps. Use non-bordered dressing only. Wound #7 - Sacrum Cleanser: Dakin 16 (oz) 0.25 1 x Per Day/30 Days Discharge Instructions: Use as directed. Topical: calmoseptine 1 x Per Day/30 Days Discharge Instructions: apply to excorated areas Primary Dressing: Gauze 1 x Per Day/30 Days Discharge Instructions: As directed: moistened with Dakins Solution Secondary Dressing: ABD Pad 5x9 (in/in) 1 x Per Day/30 Days Discharge Instructions: Cover with ABD pad Secured With: Medipore Tape - 20M Medipore H Soft Cloth Surgical Tape, 2x2 (in/yd) 1 x Per Day/30 Days Electronic Signature(s) Signed: 02/01/2022 3:54:34 PM By: [name] PA-C Signed: 02/01/2022 4:09:59 PM By: Levora Dredge Entered By: Levora Dredge on 02/01/2022 09:07:55 [name], [name] ([id number]) -------------------------------------------------------------------------------- Problem List Details Patient Name: [name] Date of Service: 02/01/2022 8:30 AM Medical Record Number: [id number] Patient Account Number: [id number] Date of Birth/Sex: 05-31-1956 (65 y.o. M) Treating RN: Levora Dredge Primary Care Provider: [name] Other Clinician: Referring Provider: [name] Treating Provider/Extender: Skipper Cliche in Treatment: 10 Active Problems ICD-10 Encounter Code Description Active Date MDM Diagnosis L89.613 Pressure ulcer of right heel, stage 3 11/22/2021 No Yes L89.623 Pressure ulcer  of left heel, stage 3 11/22/2021 No Yes L89.154 Pressure ulcer of sacral region, stage 4 11/22/2021 No Yes L24.A0 Irritant contact dermatitis due to friction or contact with body fluids, 11/22/2021 No Yes unspecified L98.412 Non-pressure chronic ulcer of buttock with fat layer exposed 11/22/2021 No Yes M62.81 Muscle weakness (generalized) 11/22/2021 No Yes F31.9 Bipolar disorder, unspecified 11/22/2021 No Yes E11.622 Type 2 diabetes mellitus with other skin ulcer 11/22/2021 No Yes I10 Essential (primary) hypertension 11/22/2021 No Yes Z79.01 Long term (current) use of anticoagulants 11/22/2021 No Yes 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 11/22/2021 No Yes Inactive Problems Resolved Problems [name], [name] ([id number]) Electronic Signature(s) Signed: 02/01/2022 8:29:27 AM By: [name] PA-C Entered By: [name] on 02/01/2022 08:29:27 Heymann, [name] ([id number]) -------------------------------------------------------------------------------- Progress Note Details Patient Name: [name] Date of Service: 02/01/2022 8:30 AM Medical Record Number: [id number] Patient Account Number: [id number] Date of Birth/Sex: [birth year] (65 y.o. M) Treating RN: Levora Dredge Primary Care Provider: [name] Other Clinician: Referring Provider: [name] Treating Provider/Extender: Skipper Cliche in Treatment: 10 Subjective Chief Complaint Information obtained from Patient Sacral, right gluteal, and bilateral heel ulcers History of Present Illness (HPI) 05/29/2021 this is a patient who presents today for initial inspection here in the clinic concerning wounds that he has over the right heel and the sacral region. Unfortunately the sacral wound is starting to spread off to the right gluteal location due to how he sits always leaning towards the right side in his chair. His wife is present she is the primary caregiver though she is not home with  him all the time she does have to work. She does do an excellent job however it appears to be in trying to keep things under good control for him. The patient is not able to change positions himself nor walk by himself so he is pretty much at the mercy of the position he is putting when she is gone and this tends to be his chair which she sits and most of the day. Obviously this I think is the main culprit for what is going on currently. It was actually in January 2020 when the sacral wound started. It was in September 2022 when the wound started to spread more to the right gluteal location. Subsequently in August 2022  is when he had been in a skilled nursing facility and the heel started to give him trouble as well. That does not seem to be doing nearly as poorly as the sacral region. He was hospitalized in October 2022 secondary to sepsis and this was in regard to the foot and was sent to skilled nursing again he is now back at home. He did have a wound VAC for the sacral wound over the summer 2022 but being in and out of facilities this ended up getting sent back. The patient does have [facility] home health that comes out 1 time per week to help with care. His most recent hemoglobin A1c was 6.9 in August 2022. Patient's met past medical history includes generalized muscle weakness, bipolar disorder, diabetes mellitus type 2, hypertension, long-term use of anticoagulant therapy due to frequent blood clots/DVTs. He also has a history of traumatic brain injury. 07/24/2021 upon evaluation today patient appears to be doing decently well in regard to the pressure ulcer on the right heel as well as the sacral region. In general I think you are making some progress here which is great news. Overall the heel unfortunately had already closed previously when we saw him although it apparently reopened when he was working with physical therapy according to his wife. The area in the sacral region is doing well and  looks clean there is still some depth here but I still think it would be difficult to wound VAC this region. His wife does an awesome job taking care of him. Is been so long since we have seen him because he has been in the hospital to be honest. 08/07/2021 upon evaluation today patient appears to be doing well at this time. Fortunately I do not see any signs of active infection locally or systemically at this time which is great news. No fevers, chills, nausea, vomiting, or diarrhea. Unfortunately after I saw him on the 24th he actually ended up in the hospital in the 27th due to being septic. This was not due to the wounds but after looking at records actually due to a UTI. Fortunately he is doing much better and very happy in that regard. I do not see any signs of infection locally nor systemically at this time. 08/21/2021 upon evaluation today patient appears to be doing well with regard to his wound. Fortunately I think that the sacral region is doing decently well at this point which is great news. With regard to the foot this also does have some slough noted but I feel like we are making progress here. He does have some irritation around the right upper thigh/gluteal region. I feel like it is more towards the thigh. Nonetheless this does appear to be pressure related he spends a lot of time sitting up his caregiver states he really will not get in the bed and stay there like he should. 09/04/2021 upon evaluation patient appears to be doing decently well today in regard to the wound on his heel as well as the sacral area. I am actually very pleased with both and how things are appearing currently. There does not appear to be any evidence of active infection I think that his caregiver is doing an awesome job with regard to the wound care here. She is present today as well and I did discuss this with her. 09/25/2021 upon evaluation today patient appears to be doing well with regard to his wound on the  sacral region. His right heel is also doing  well. Unfortunately he has a new area on his left heel which does appear to be pressure injury. I do not see any evidence of active infection locally or systemically which is great news no fevers, chills, nausea, vomiting, or diarrhea. Readmission: 11-22-2021 upon evaluation today patient presents for reevaluation here in the clinic concerning issues with his wounds. Since have last seen him he was in the hospital and then subsequently ended up in a skilled nursing facility. Subsequently period of time in the facility he actually had a breakdown in the right thigh location which has been an issue for him. Fortunately I do not see any evidence of active infection locally or systemically which is great news and I am very pleased in that regard. Nonetheless he does have still the wounds on both heels as well as the wound in the sacral area and the new area in the right upper thigh/gluteal region. 12-13-2021 upon evaluation today patient appears to be doing well currently in regard to his wounds. The left heel is going to require some sharp debridement. Fortunately the right heel seems to be doing quite well. Overall I think his gluteal region as well as sacral region also are doing well. 01-10-2022 upon evaluation today patient appears to be doing better in regard to some areas unfortunately his sacral area is not doing as well. It seems of gotten worse his caregiver states when he was in the hospital recently they really did not offloading. Fortunately I do not see any evidence of active infection locally or systemically at this time. 02-01-2022 upon evaluation today patient appears to be doing well currently in regard to his wounds. He is going require some sharp debridement of clearway some of the necrotic debris. Fortunately I do not see any evidence of active infection locally or systemically which is great news. No fevers, chills, nausea, vomiting, or  diarrhea. [name], [name] ([id number]) Objective Constitutional Well-nourished and well-hydrated in no acute distress. Vitals Time Taken: 8:32 AM, Height: 66 in, Weight: 279 lbs, BMI: 45, Temperature: 97.5 F, Pulse: 103 bpm, Respiratory Rate: 18 breaths/min, Blood Pressure: 163/91 mmHg. Respiratory normal breathing without difficulty. Psychiatric this patient is able to make decisions and demonstrates good insight into disease process. Alert and Oriented x 3. pleasant and cooperative. General Notes: Upon inspection patient's wound bed actually showed signs of some need for sharp debridement in regard to the heels. He tolerated the debridement today without complication and postdebridement wound bed appears to be doing significantly better which is great news. Integumentary (Hair, Skin) Wound #5 status is Open. Original cause of wound was Pressure Injury. The date acquired was: 07/01/2021. The wound has been in treatment 10 weeks. The wound is located on the Right Calcaneus. The wound measures 1.9cm length x 2.6cm width x 0.1cm depth; 3.88cm^2 area and 0.388cm^3 volume. There is Fat Layer (Subcutaneous Tissue) exposed. There is no tunneling or undermining noted. There is a medium amount of serosanguineous drainage noted. There is large (67-100%) red, pink granulation within the wound bed. There is a small (1-33%) amount of necrotic tissue within the wound bed including Adherent Slough. Wound #6 status is Open. Original cause of wound was Pressure Injury. The date acquired was: 07/01/2021. The wound has been in treatment 10 weeks. The wound is located on the Left Calcaneus. The wound measures 0.8cm length x 2.3cm width x 0.1cm depth; 1.445cm^2 area and 0.145cm^3 volume. There is no tunneling or undermining noted. There is a small amount of serosanguineous drainage noted. There  is medium (34-66%) granulation within the wound bed. There is a medium (34-66%) amount of necrotic tissue within the  wound bed including Eschar. Wound #7 status is Open. Original cause of wound was Pressure Injury. The date acquired was: 07/01/2021. The wound has been in treatment 10 weeks. The wound is located on the Sacrum. The wound measures 2cm length x 2cm width x 4.1cm depth; 3.142cm^2 area and 12.881cm^3 volume. There is Fat Layer (Subcutaneous Tissue) exposed. There is no tunneling or undermining noted. There is a medium amount of serosanguineous drainage noted. There is large (67-100%) red granulation within the wound bed. There is no necrotic tissue within the wound bed. General Notes: surrounding skin appears macerated Assessment Active Problems ICD-10 Pressure ulcer of right heel, stage 3 Pressure ulcer of left heel, stage 3 Pressure ulcer of sacral region, stage 4 Irritant contact dermatitis due to friction or contact with body fluids, unspecified Non-pressure chronic ulcer of buttock with fat layer exposed Muscle weakness (generalized) Bipolar disorder, unspecified Type 2 diabetes mellitus with other skin ulcer Essential (primary) hypertension Long term (current) use of anticoagulants Personal history of traumatic brain injury Procedures [name], [name] ([id number]) Wound #5 Pre-procedure diagnosis of Wound #5 is a Pressure Ulcer located on the Right Calcaneus . There was a Excisional Skin/Subcutaneous Tissue Debridement with a total area of 4.94 sq cm performed by Tommie Sams., PA-C. With the following instrument(s): Curette to remove Non-Viable tissue/material. Material removed includes Callus, Subcutaneous Tissue, and Slough. No specimens were taken. A time out was conducted at 08:50, prior to the start of the procedure. A Minimum amount of bleeding was controlled with Pressure. The procedure was tolerated well. Post Debridement Measurements: 1.9cm length x 2.6cm width x 0.1cm depth; 0.388cm^3 volume. Post debridement Stage noted as Category/Stage III. Character of Wound/Ulcer  Post Debridement is stable. Post procedure Diagnosis Wound #5: Same as Pre-Procedure Wound #6 Pre-procedure diagnosis of Wound #6 is a Pressure Ulcer located on the Left Calcaneus . There was a Excisional Skin/Subcutaneous Tissue Debridement with a total area of 1.84 sq cm performed by Tommie Sams., PA-C. With the following instrument(s): Curette to remove Non-Viable tissue/material. Material removed includes Callus, Subcutaneous Tissue, and Slough after achieving pain control using Lidocaine 4% Topical Solution. No specimens were taken. A time out was conducted at 08:50, prior to the start of the procedure. A Minimum amount of bleeding was controlled with Pressure. The procedure was tolerated well. Post Debridement Measurements: 0.8cm length x 2.3cm width x 0.1cm depth; 0.145cm^3 volume. Post debridement Stage noted as Unstageable/Unclassified. Character of Wound/Ulcer Post Debridement is stable. Post procedure Diagnosis Wound #6: Same as Pre-Procedure Plan Follow-up Appointments: Return Appointment in 3 weeks. Home Health: [facility] for wound care. May utilize formulary equivalent dressing for wound treatment orders unless otherwise specified. Home Health Nurse may visit PRN to address patient s wound care needs. Kathaleen Bury phone [phone] fax [phone] Scheduled days for dressing changes to be completed; exception, patient has scheduled wound care visit that day. **Please direct any NON-WOUND related issues/requests for orders to patient's Primary Care Physician. **If current dressing causes regression in wound condition, may D/C ordered dressing product/s and apply Normal Saline Moist Dressing daily until next [facility] or Other MD appointment. **Notify [facility] of regression in wound condition at [phone]. Bathing/ Shower/ Hygiene: May shower with wound dressing protected with water repellent cover or cast protector. No tub bath. Anesthetic  (Use 'Patient Medications' Section for Anesthetic Order Entry): Lidocaine applied  to wound bed Edema Control - Lymphedema / Segmental Compressive Device / Other: Elevate, Exercise Daily and Avoid Standing for Long Periods of Time. Elevate legs to the level of the heart and pump ankles as often as possible Elevate leg(s) parallel to the floor when sitting. DO YOUR BEST to sleep in the bed at night. DO NOT sleep in your recliner. Long hours of sitting in a recliner leads to swelling of the legs and/or potential wounds on your backside. Off-Loading: Gel wheelchair cushion Low air-loss mattress (Group 2) Turn and reposition every 2 hours - keep pressure off of the sacrum and heels wounds Other: - PRAFO boot in bed keep pressure off of sacrum/gluteus and heels- Additional Orders / Instructions: Follow Nutritious Diet and Increase Protein Intake WOUND #5: - Calcaneus Wound Laterality: Right Primary Dressing: Hydrofera Blue Ready Transfer Foam, 2.5x2.5 (in/in) 3 x Per Week/30 Days Discharge Instructions: Apply Hydrofera Blue Ready to wound bed as directed Secondary Dressing: (SILCONE BORDER) Zetuvit Plus SILICONE BORDER Dressing 5x5 (in/in) 3 x Per Week/30 Days Discharge Instructions: Please do not put silicone bordered dressings under wraps. Use non-bordered dressing only. WOUND #6: - Calcaneus Wound Laterality: Left Primary Dressing: Hydrofera Blue Ready Transfer Foam, 2.5x2.5 (in/in) 1 x Per Day/30 Days Discharge Instructions: Apply Hydrofera Blue Ready to wound bed as directed Secondary Dressing: (SILICONE BORDER) Zetuvit Plus SILICONE BORDER Dressing 4x4 (in/in) 1 x Per Day/30 Days Discharge Instructions: Please do not put silicone bordered dressings under wraps. Use non-bordered dressing only. WOUND #7: - Sacrum Wound Laterality: Cleanser: Dakin 16 (oz) 0.25 1 x Per Day/30 Days Discharge Instructions: Use as directed. Topical: calmoseptine 1 x Per Day/30 Days Discharge Instructions:  apply to excorated areas Primary Dressing: Gauze 1 x Per Day/30 Days Discharge Instructions: As directed: moistened with Dakins Solution Secondary Dressing: ABD Pad 5x9 (in/in) 1 x Per Day/30 Days Discharge Instructions: Cover with ABD pad Secured With: Medipore Tape - 62M Medipore H Soft Cloth Surgical Tape, 2x2 (in/yd) 1 x Per Day/30 Days [name], [name] ([id number]) 1. I am going to suggest that we go ahead and continue with the wound care measures as before and the patient is in agreement with plan this includes the use of the Hydrofera Blue to the heels which I think is doing quite well. 2. I am also can recommend that we continue with the Dakin's moistened gauze dressing although I do think it needs to be squeezed out and does need to be packed into the base of the wound that is not been done at this point according to what his caregiver tells me ask her why she just did not feel like that that was necessary but I did explain why it is she is good to do that going forward. We will see patient back for reevaluation in 1 week here in the clinic. If anything worsens or changes patient will contact our office for additional recommendations. Electronic Signature(s) Signed: 02/01/2022 9:50:04 AM By: [name] PA-C Entered By: [name] on 02/01/2022 09:50:04 [name], [name] ([id number]) -------------------------------------------------------------------------------- SuperBill Details Patient Name: [name] Date of Service: 02/01/2022 Medical Record Number: [id number] Patient Account Number: [id number] Date of Birth/Sex: 07/09/1955 (65 y.o. M) Treating RN: Levora Dredge Primary Care Provider: [name] Other Clinician: Referring Provider: [name] Treating Provider/Extender: Skipper Cliche in Treatment: 10 Diagnosis Coding ICD-10 Codes Code Description [phone] Pressure ulcer of right heel, stage 3 L89.623 Pressure ulcer of left heel,  stage 3 L89.154 Pressure ulcer of sacral region, stage  4 L24.A0 Irritant contact dermatitis due to friction or contact with body fluids, unspecified L98.412 Non-pressure chronic ulcer of buttock with fat layer exposed M62.81 Muscle weakness (generalized) F31.9 Bipolar disorder, unspecified E11.622 Type 2 diabetes mellitus with other skin ulcer I10 Essential (primary) hypertension Z79.01 Long term (current) use of anticoagulants 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 Facility Procedures CPT4 Code: [id number] Description: 11042 - DEB SUBQ TISSUE 20 SQ CM/< Modifier: Quantity: 1 CPT4 Code: Description: ICD-10 Diagnosis Description L89.613 Pressure ulcer of right heel, stage 3 L89.154 Pressure ulcer of sacral region, stage 4 Modifier: Quantity: Physician Procedures CPT4 Code: 3212248 Description: 99213 - WC PHYS LEVEL 3 - EST PT Modifier: 25 Quantity: 1 CPT4 Code: Description: ICD-10 Diagnosis Description L89.613 Pressure ulcer of right heel, stage 3 L89.623 Pressure ulcer of left heel, stage 3 L89.154 Pressure ulcer of sacral region, stage 4 L24.A0 Irritant contact dermatitis due to friction or contact with body  fluids, u Modifier: nspecified Quantity: CPT4 Code: 2500370 Description: 11042 - WC PHYS SUBQ TISS 20 SQ CM Modifier: Quantity: 1 CPT4 Code: Description: ICD-10 Diagnosis Description L89.613 Pressure ulcer of right heel, stage 3 L89.154 Pressure ulcer of sacral region, stage 4 Modifier: Quantity: Electronic Signature(s) Signed: 02/01/2022 9:50:30 AM By: [name] PA-C Entered By: [name] on 02/01/2022 09:50:29

## 2022-02-13 ENCOUNTER — Telehealth: Payer: Self-pay | Admitting: Psychiatry

## 2022-02-13 ENCOUNTER — Encounter: Payer: Self-pay | Admitting: Urology

## 2022-02-13 ENCOUNTER — Ambulatory Visit (INDEPENDENT_AMBULATORY_CARE_PROVIDER_SITE_OTHER): Payer: Medicare Other | Admitting: Urology

## 2022-02-13 VITALS — BP 133/77 | HR 99 | Ht 66.0 in | Wt 212.0 lb

## 2022-02-13 DIAGNOSIS — N319 Neuromuscular dysfunction of bladder, unspecified: Secondary | ICD-10-CM | POA: Diagnosis not present

## 2022-02-13 DIAGNOSIS — R339 Retention of urine, unspecified: Secondary | ICD-10-CM | POA: Diagnosis not present

## 2022-02-13 DIAGNOSIS — N390 Urinary tract infection, site not specified: Secondary | ICD-10-CM

## 2022-02-13 DIAGNOSIS — Z87440 Personal history of urinary (tract) infections: Secondary | ICD-10-CM

## 2022-02-13 NOTE — Telephone Encounter (Signed)
Spoke to patient and wife regarding prescription note per Dr. Elna Breslow. Stated she will check the medication bottle to verify.

## 2022-02-13 NOTE — Progress Notes (Addendum)
Patient ID: [name], male   DOB: 02/21/1956, 66 y.o.   MRN: [id number] Cath Change/ Replacement  Patient is present today for a catheter change due to urinary retention.  55ml of water was removed from the balloon, a 16FR foley silastic cath was removed with out difficulty.  Patient was cleaned and prepped in a sterile fashion with betadine. A 16 FR foley cath was replaced into the bladder no complications were noted Urine return was noted and urine was yellow in color. The balloon was filled with 73ml of sterile water. A night bag was attached for drainage, patient was given instruction on how to change from one bag to another. Patient was given proper instruction on catheter care.    Performed by: Mervin Hack, CMA  Follow up: symptom check, ( home health to change cath)

## 2022-02-13 NOTE — Progress Notes (Signed)
   02/13/2022 8:51 AM   [name] 29-Dec-1955 [id number]  Reason for visit: Follow up neurogenic bladder, history of urethral stricture, recurrent UTI  HPI: Extremely comorbid 66 year old male with history of apparent neurogenic bladder since early 2000's when he had a traumatic brain injury.  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  He previously was managing his bladder with catheterization 3-4 times per day, but since his health has declined over the last 24 months he has had an indwelling Foley that is changed monthly by home health nursing secondary to his frailty, weakness, and inability to catheterize.   He has had multiple admissions recently for UTI versus sacral wound infection and has been treated with numerous antibiotics.  Most recent urine culture from 01/12/2022 grew multidrug resistant Pseudomonas.  I personally viewed and interpreted the most recent CT abdomen pelvis with contrast dated 01/12/2022 that shows no hydronephrosis or stones, decompressed bladder with Foley in place.  History is obtained primarily from his wife again today.  They deny any problems with the current catheter and has not had any leakage around the Foley or problems with leaking.  I again encouraged him to take cranberry tablets twice daily to help prevent infections.  We also briefly discussed consideration of a suprapubic tube, but they are not interested in pursuing that route at this time.  He still remains too weak for resuming intermittent catheterization, and realistically I think this will likely be a long-term indwelling Foley versus suprapubic tube for bladder management.  Foley was changed today, see procedure note  RTC 6 months PA, see me yearly   Sondra Come, MD  [facility] [address], [address] [place], [place] [address] [phone]

## 2022-02-13 NOTE — Telephone Encounter (Signed)
QUEtiapine (SEROQUEL) 100 MG tablet 90 tablet 0 12/24/2021    Sig - Route: Take 1 tablet (100 mg total) by mouth at bedtime. - Oral   Sent to pharmacy as: QUEtiapine (SEROQUEL) 100 MG tablet   E-Prescribing Status: Receipt confirmed by pharmacy (12/24/2021  5:09 PM     QUEtiapine (SEROQUEL) 50 MG tablet 90 tablet 0 12/24/2021    Sig: TAKE 1/2 TO 1 (ONE-HALF TO ONE) TABLET BY MOUTH ONCE DAILY AS NEEDED FOR  AGITATION   Sent to pharmacy as: QUEtiapine (SEROQUEL) 50 MG tablet   E-Prescribing Status: Receipt confirmed by pharmacy (12/24/2021  5:09 PM EDT     90 days supply of Seroquel both dosages 150 mg-was sent out on 12/24/2021-he will only be due for it end of September.  I do not think he is due for it at this time.

## 2022-02-13 NOTE — Telephone Encounter (Signed)
Patient and spouse stopped by office. Rescheduled appointment in July due to hospitalized, but he has appointment at the end of August. States he will run out of Seroquel 100 mg and is out of the 50 mg Seroquel. Wants to know if it can be refilled. Send to [facility] on [address]. Please advise.

## 2022-02-22 ENCOUNTER — Encounter: Payer: Medicare Other | Admitting: Physician Assistant

## 2022-02-22 ENCOUNTER — Emergency Department: Payer: Medicare Other

## 2022-02-22 ENCOUNTER — Inpatient Hospital Stay
Admission: EM | Admit: 2022-02-22 | Discharge: 2022-02-25 | DRG: 698 | Disposition: A | Discharge status: Home Health | Payer: Medicare Other | Source: Ambulatory Visit | Attending: Internal Medicine | Admitting: Internal Medicine

## 2022-02-22 ENCOUNTER — Other Ambulatory Visit
Admission: RE | Admit: 2022-02-22 | Discharge: 2022-02-22 | Disposition: A | Discharge status: Home / Self Care | Payer: Medicare Other | Source: Ambulatory Visit | Attending: Physician Assistant | Admitting: Physician Assistant

## 2022-02-22 DIAGNOSIS — L03116 Cellulitis of left lower limb: Secondary | ICD-10-CM | POA: Diagnosis present

## 2022-02-22 DIAGNOSIS — E871 Hypo-osmolality and hyponatremia: Secondary | ICD-10-CM | POA: Diagnosis present

## 2022-02-22 DIAGNOSIS — Z6830 Body mass index (BMI) 30.0-30.9, adult: Secondary | ICD-10-CM

## 2022-02-22 DIAGNOSIS — M6281 Muscle weakness (generalized): Secondary | ICD-10-CM | POA: Diagnosis not present

## 2022-02-22 DIAGNOSIS — I5032 Chronic diastolic (congestive) heart failure: Secondary | ICD-10-CM | POA: Diagnosis present

## 2022-02-22 DIAGNOSIS — L89154 Pressure ulcer of sacral region, stage 4: Secondary | ICD-10-CM | POA: Diagnosis not present

## 2022-02-22 DIAGNOSIS — Z87440 Personal history of urinary (tract) infections: Secondary | ICD-10-CM

## 2022-02-22 DIAGNOSIS — A419 Sepsis, unspecified organism: Secondary | ICD-10-CM | POA: Diagnosis not present

## 2022-02-22 DIAGNOSIS — Z79899 Other long term (current) drug therapy: Secondary | ICD-10-CM

## 2022-02-22 DIAGNOSIS — Z818 Family history of other mental and behavioral disorders: Secondary | ICD-10-CM

## 2022-02-22 DIAGNOSIS — G8220 Paraplegia, unspecified: Secondary | ICD-10-CM | POA: Diagnosis present

## 2022-02-22 DIAGNOSIS — Z20822 Contact with and (suspected) exposure to covid-19: Secondary | ICD-10-CM | POA: Diagnosis present

## 2022-02-22 DIAGNOSIS — E11622 Type 2 diabetes mellitus with other skin ulcer: Secondary | ICD-10-CM | POA: Diagnosis not present

## 2022-02-22 DIAGNOSIS — L89619 Pressure ulcer of right heel, unspecified stage: Secondary | ICD-10-CM | POA: Diagnosis present

## 2022-02-22 DIAGNOSIS — E1122 Type 2 diabetes mellitus with diabetic chronic kidney disease: Secondary | ICD-10-CM | POA: Diagnosis present

## 2022-02-22 DIAGNOSIS — Z933 Colostomy status: Secondary | ICD-10-CM | POA: Diagnosis not present

## 2022-02-22 DIAGNOSIS — I130 Hypertensive heart and chronic kidney disease with heart failure and stage 1 through stage 4 chronic kidney disease, or unspecified chronic kidney disease: Secondary | ICD-10-CM | POA: Diagnosis present

## 2022-02-22 DIAGNOSIS — A4101 Sepsis due to Methicillin susceptible Staphylococcus aureus: Secondary | ICD-10-CM | POA: Diagnosis present

## 2022-02-22 DIAGNOSIS — N390 Urinary tract infection, site not specified: Secondary | ICD-10-CM | POA: Diagnosis present

## 2022-02-22 DIAGNOSIS — L89629 Pressure ulcer of left heel, unspecified stage: Secondary | ICD-10-CM | POA: Diagnosis present

## 2022-02-22 DIAGNOSIS — Z86718 Personal history of other venous thrombosis and embolism: Secondary | ICD-10-CM

## 2022-02-22 DIAGNOSIS — T83511A Infection and inflammatory reaction due to indwelling urethral catheter, initial encounter: Secondary | ICD-10-CM | POA: Diagnosis present

## 2022-02-22 DIAGNOSIS — R509 Fever, unspecified: Secondary | ICD-10-CM | POA: Diagnosis present

## 2022-02-22 DIAGNOSIS — N319 Neuromuscular dysfunction of bladder, unspecified: Secondary | ICD-10-CM | POA: Diagnosis present

## 2022-02-22 DIAGNOSIS — L24A0 Irritant contact dermatitis due to friction or contact with body fluids, unspecified: Secondary | ICD-10-CM | POA: Diagnosis not present

## 2022-02-22 DIAGNOSIS — L89159 Pressure ulcer of sacral region, unspecified stage: Secondary | ICD-10-CM | POA: Diagnosis present

## 2022-02-22 DIAGNOSIS — E1129 Type 2 diabetes mellitus with other diabetic kidney complication: Secondary | ICD-10-CM | POA: Diagnosis present

## 2022-02-22 DIAGNOSIS — L89623 Pressure ulcer of left heel, stage 3: Secondary | ICD-10-CM | POA: Diagnosis not present

## 2022-02-22 DIAGNOSIS — I82409 Acute embolism and thrombosis of unspecified deep veins of unspecified lower extremity: Secondary | ICD-10-CM | POA: Diagnosis not present

## 2022-02-22 DIAGNOSIS — S069XAA Unspecified intracranial injury with loss of consciousness status unknown, initial encounter: Secondary | ICD-10-CM | POA: Diagnosis present

## 2022-02-22 DIAGNOSIS — N179 Acute kidney failure, unspecified: Secondary | ICD-10-CM | POA: Diagnosis present

## 2022-02-22 DIAGNOSIS — I10 Essential (primary) hypertension: Secondary | ICD-10-CM | POA: Diagnosis not present

## 2022-02-22 DIAGNOSIS — Z978 Presence of other specified devices: Secondary | ICD-10-CM | POA: Diagnosis not present

## 2022-02-22 DIAGNOSIS — Z7901 Long term (current) use of anticoagulants: Secondary | ICD-10-CM | POA: Diagnosis not present

## 2022-02-22 DIAGNOSIS — N1832 Chronic kidney disease, stage 3b: Secondary | ICD-10-CM | POA: Diagnosis present

## 2022-02-22 DIAGNOSIS — R6520 Severe sepsis without septic shock: Secondary | ICD-10-CM | POA: Diagnosis present

## 2022-02-22 DIAGNOSIS — L89613 Pressure ulcer of right heel, stage 3: Secondary | ICD-10-CM | POA: Diagnosis present

## 2022-02-22 DIAGNOSIS — E669 Obesity, unspecified: Secondary | ICD-10-CM | POA: Diagnosis present

## 2022-02-22 DIAGNOSIS — N136 Pyonephrosis: Secondary | ICD-10-CM | POA: Diagnosis present

## 2022-02-22 DIAGNOSIS — Y846 Urinary catheterization as the cause of abnormal reaction of the patient, or of later complication, without mention of misadventure at the time of the procedure: Secondary | ICD-10-CM | POA: Diagnosis present

## 2022-02-22 DIAGNOSIS — E785 Hyperlipidemia, unspecified: Secondary | ICD-10-CM | POA: Diagnosis present

## 2022-02-22 DIAGNOSIS — Z87820 Personal history of traumatic brain injury: Secondary | ICD-10-CM

## 2022-02-22 DIAGNOSIS — F319 Bipolar disorder, unspecified: Secondary | ICD-10-CM | POA: Diagnosis present

## 2022-02-22 DIAGNOSIS — Z7984 Long term (current) use of oral hypoglycemic drugs: Secondary | ICD-10-CM

## 2022-02-22 DIAGNOSIS — I5033 Acute on chronic diastolic (congestive) heart failure: Secondary | ICD-10-CM | POA: Diagnosis present

## 2022-02-22 DIAGNOSIS — L98412 Non-pressure chronic ulcer of buttock with fat layer exposed: Secondary | ICD-10-CM | POA: Diagnosis not present

## 2022-02-22 LAB — PROTIME-INR
INR: 1.6 — ABNORMAL HIGH (ref 0.8–1.2)
Prothrombin Time: 19.1 seconds — ABNORMAL HIGH (ref 11.4–15.2)

## 2022-02-22 LAB — RESP PANEL BY RT-PCR (FLU A&B, COVID) ARPGX2
Influenza A by PCR: NEGATIVE
Influenza B by PCR: NEGATIVE
SARS Coronavirus 2 by RT PCR: NEGATIVE

## 2022-02-22 LAB — URINALYSIS, COMPLETE (UACMP) WITH MICROSCOPIC
Bilirubin Urine: NEGATIVE
Glucose, UA: 50 mg/dL — AB
Ketones, ur: NEGATIVE mg/dL
Nitrite: POSITIVE — AB
Protein, ur: >=300 mg/dL — AB
Specific Gravity, Urine: 1.018 (ref 1.005–1.030)
WBC, UA: >50 WBC/hpf — ABNORMAL HIGH (ref 0–5)
pH: 5 (ref 5.0–8.0)

## 2022-02-22 LAB — BASIC METABOLIC PANEL
Anion gap: 5 (ref 5–15)
Anion gap: 5 (ref 5–15)
BUN: 24 mg/dL — ABNORMAL HIGH (ref 8–23)
BUN: 21 mg/dL (ref 8–23)
CO2: 20 mmol/L — ABNORMAL LOW (ref 22–32)
CO2: 18 mmol/L — ABNORMAL LOW (ref 22–32)
Calcium: 8.2 mg/dL — ABNORMAL LOW (ref 8.9–10.3)
Calcium: 6.5 mg/dL — ABNORMAL LOW (ref 8.9–10.3)
Chloride: 103 mmol/L (ref 98–111)
Chloride: 111 mmol/L (ref 98–111)
Creatinine, Ser: 2.04 mg/dL — ABNORMAL HIGH (ref 0.61–1.24)
Creatinine, Ser: 1.48 mg/dL — ABNORMAL HIGH (ref 0.61–1.24)
GFR, Estimated: 35 mL/min — ABNORMAL LOW (ref >60)
GFR, Estimated: 52 mL/min — ABNORMAL LOW (ref >60)
Glucose, Bld: 242 mg/dL — ABNORMAL HIGH (ref 70–99)
Glucose, Bld: 131 mg/dL — ABNORMAL HIGH (ref 70–99)
Potassium: 4.6 mmol/L (ref 3.5–5.1)
Potassium: 3.4 mmol/L — ABNORMAL LOW (ref 3.5–5.1)
Sodium: 128 mmol/L — ABNORMAL LOW (ref 135–145)
Sodium: 134 mmol/L — ABNORMAL LOW (ref 135–145)

## 2022-02-22 LAB — COMPREHENSIVE METABOLIC PANEL
ALT: 14 U/L (ref 0–44)
AST: 20 U/L (ref 15–41)
Albumin: 2.9 g/dL — ABNORMAL LOW (ref 3.5–5.0)
Alkaline Phosphatase: 92 U/L (ref 38–126)
Anion gap: 10 (ref 5–15)
BUN: 25 mg/dL — ABNORMAL HIGH (ref 8–23)
CO2: 19 mmol/L — ABNORMAL LOW (ref 22–32)
Calcium: 8.5 mg/dL — ABNORMAL LOW (ref 8.9–10.3)
Chloride: 100 mmol/L (ref 98–111)
Creatinine, Ser: 2.09 mg/dL — ABNORMAL HIGH (ref 0.61–1.24)
GFR, Estimated: 34 mL/min — ABNORMAL LOW (ref >60)
Glucose, Bld: 291 mg/dL — ABNORMAL HIGH (ref 70–99)
Potassium: 4.7 mmol/L (ref 3.5–5.1)
Sodium: 129 mmol/L — ABNORMAL LOW (ref 135–145)
Total Bilirubin: 0.6 mg/dL (ref 0.3–1.2)
Total Protein: 8.5 g/dL — ABNORMAL HIGH (ref 6.5–8.1)

## 2022-02-22 LAB — CBC WITH DIFFERENTIAL/PLATELET
Abs Immature Granulocytes: 0.06 10*3/uL (ref 0.00–0.07)
Basophils Absolute: 0 10*3/uL (ref 0.0–0.1)
Basophils Relative: 0 %
Eosinophils Absolute: 0.1 10*3/uL (ref 0.0–0.5)
Eosinophils Relative: 0 %
HCT: 30 % — ABNORMAL LOW (ref 39.0–52.0)
Hemoglobin: 9.1 g/dL — ABNORMAL LOW (ref 13.0–17.0)
Immature Granulocytes: 1 %
Lymphocytes Relative: 8 %
Lymphs Abs: 1 10*3/uL (ref 0.7–4.0)
MCH: 25.6 pg — ABNORMAL LOW (ref 26.0–34.0)
MCHC: 30.3 g/dL (ref 30.0–36.0)
MCV: 84.5 fL (ref 80.0–100.0)
Monocytes Absolute: 0.7 10*3/uL (ref 0.1–1.0)
Monocytes Relative: 5 %
Neutro Abs: 10.2 10*3/uL — ABNORMAL HIGH (ref 1.7–7.7)
Neutrophils Relative %: 86 %
Platelets: 247 10*3/uL (ref 150–400)
RBC: 3.55 MIL/uL — ABNORMAL LOW (ref 4.22–5.81)
RDW: 16.5 % — ABNORMAL HIGH (ref 11.5–15.5)
WBC: 11.9 10*3/uL — ABNORMAL HIGH (ref 4.0–10.5)
nRBC: 0 % (ref 0.0–0.2)

## 2022-02-22 LAB — OSMOLALITY, URINE: Osmolality, Ur: 506 mOsm/kg (ref 300–900)

## 2022-02-22 LAB — LACTIC ACID, PLASMA
Lactic Acid, Venous: 3.5 mmol/L (ref 0.5–1.9)
Lactic Acid, Venous: 1.2 mmol/L (ref 0.5–1.9)

## 2022-02-22 LAB — OSMOLALITY: Osmolality: 286 mOsm/kg (ref 275–295)

## 2022-02-22 LAB — SEDIMENTATION RATE: Sed Rate: 106 mm/hr — ABNORMAL HIGH (ref 0–20)

## 2022-02-22 LAB — C-REACTIVE PROTEIN: CRP: 10.3 mg/dL — ABNORMAL HIGH (ref <1.0)

## 2022-02-22 LAB — PROCALCITONIN: Procalcitonin: 0.3 ng/mL

## 2022-02-22 LAB — BRAIN NATRIURETIC PEPTIDE: B Natriuretic Peptide: 37.8 pg/mL (ref 0.0–100.0)

## 2022-02-22 LAB — CBG MONITORING, ED: Glucose-Capillary: 135 mg/dL — ABNORMAL HIGH (ref 70–99)

## 2022-02-22 LAB — APTT: aPTT: 40 seconds — ABNORMAL HIGH (ref 24–36)

## 2022-02-22 LAB — SODIUM, URINE, RANDOM: Sodium, Ur: 64 mmol/L

## 2022-02-22 MED ORDER — OXCARBAZEPINE 300 MG PO TABS
300.0000 mg | ORAL_TABLET | Freq: Two times a day (BID) | ORAL | Status: DC
  Administered 2022-02-22 – 2022-02-25 (×6): 300 mg via ORAL
  Filled 2022-02-22 (×7): qty 1

## 2022-02-22 MED ORDER — SODIUM CHLORIDE 0.9 % IV SOLN
2.0000 g | Freq: Once | INTRAVENOUS | Status: AC
  Administered 2022-02-22: 2 g via INTRAVENOUS
  Filled 2022-02-22: qty 12.5

## 2022-02-22 MED ORDER — APIXABAN 5 MG PO TABS
5.0000 mg | ORAL_TABLET | Freq: Two times a day (BID) | ORAL | Status: DC
  Administered 2022-02-22 – 2022-02-25 (×6): 5 mg via ORAL
  Filled 2022-02-22 (×6): qty 1

## 2022-02-22 MED ORDER — ROSUVASTATIN CALCIUM 10 MG PO TABS
20.0000 mg | ORAL_TABLET | Freq: Every day | ORAL | Status: DC
  Administered 2022-02-23 – 2022-02-25 (×3): 20 mg via ORAL
  Filled 2022-02-22: qty 2
  Filled 2022-02-22: qty 1
  Filled 2022-02-22: qty 2

## 2022-02-22 MED ORDER — VANCOMYCIN HCL IN DEXTROSE 1-5 GM/200ML-% IV SOLN: 1000.0000 mg | Freq: Once | INTRAVENOUS | Status: DC

## 2022-02-22 MED ORDER — ACETAMINOPHEN 325 MG PO TABS
650.0000 mg | ORAL_TABLET | Freq: Four times a day (QID) | ORAL | Status: DC | PRN
  Administered 2022-02-25: 650 mg via ORAL

## 2022-02-22 MED ORDER — LACTATED RINGERS IV SOLN: INTRAVENOUS | Status: DC

## 2022-02-22 MED ORDER — SODIUM CHLORIDE 0.9 % IV SOLN: INTRAVENOUS | Status: DC

## 2022-02-22 MED ORDER — HYDRALAZINE HCL 20 MG/ML IJ SOLN: 10.0000 mg | Freq: Once | INTRAMUSCULAR | Status: DC

## 2022-02-22 MED ORDER — QUETIAPINE FUMARATE 25 MG PO TABS: 50.0000 mg | ORAL_TABLET | Freq: Two times a day (BID) | ORAL | Status: DC | PRN

## 2022-02-22 MED ORDER — VITAMIN D 25 MCG (1000 UNIT) PO TABS
1000.0000 [IU] | ORAL_TABLET | Freq: Every day | ORAL | Status: DC
Start: 2022-02-23 — End: 2022-02-25
  Administered 2022-02-23 – 2022-02-25 (×3): 1000 [IU] via ORAL
  Filled 2022-02-22 (×3): qty 1

## 2022-02-22 MED ORDER — MEMANTINE HCL 5 MG PO TABS
10.0000 mg | ORAL_TABLET | Freq: Two times a day (BID) | ORAL | Status: DC
  Administered 2022-02-22 – 2022-02-25 (×6): 10 mg via ORAL
  Filled 2022-02-22 (×6): qty 2

## 2022-02-22 MED ORDER — METRONIDAZOLE 500 MG/100ML IV SOLN
500.0000 mg | Freq: Once | INTRAVENOUS | Status: AC
  Administered 2022-02-22: 500 mg via INTRAVENOUS
  Filled 2022-02-22: qty 100

## 2022-02-22 MED ORDER — SODIUM CHLORIDE 0.9 % IV SOLN
2.0000 g | Freq: Two times a day (BID) | INTRAVENOUS | Status: DC
  Administered 2022-02-23: 2 g via INTRAVENOUS
  Filled 2022-02-22: qty 12.5

## 2022-02-22 MED ORDER — OMEGA-3-ACID ETHYL ESTERS 1 G PO CAPS
1.0000 | ORAL_CAPSULE | Freq: Every day | ORAL | Status: DC
Start: 2022-02-23 — End: 2022-02-25
  Administered 2022-02-23 – 2022-02-25 (×3): 1 g via ORAL
  Filled 2022-02-22 (×3): qty 1

## 2022-02-22 MED ORDER — VITAMIN B-12 1000 MCG PO TABS
3000.0000 ug | ORAL_TABLET | Freq: Every day | ORAL | Status: DC
Start: 2022-02-23 — End: 2022-02-25
  Administered 2022-02-23 – 2022-02-25 (×3): 3000 ug via ORAL
  Filled 2022-02-22 (×3): qty 3

## 2022-02-22 MED ORDER — SODIUM CHLORIDE 0.9 % IV BOLUS
1000.0000 mL | Freq: Once | INTRAVENOUS | Status: AC
  Administered 2022-02-22: 1000 mL via INTRAVENOUS

## 2022-02-22 MED ORDER — LACTATED RINGERS IV BOLUS (SEPSIS)
1000.0000 mL | Freq: Once | INTRAVENOUS | Status: AC
Start: 2022-02-22 — End: 2022-02-22
  Administered 2022-02-22: 1000 mL via INTRAVENOUS

## 2022-02-22 MED ORDER — VANCOMYCIN HCL 750 MG/150ML IV SOLN
750.0000 mg | INTRAVENOUS | Status: DC
  Filled 2022-02-22: qty 150

## 2022-02-22 MED ORDER — METRONIDAZOLE 500 MG/100ML IV SOLN
500.0000 mg | Freq: Two times a day (BID) | INTRAVENOUS | Status: DC
  Administered 2022-02-22: 500 mg via INTRAVENOUS
  Filled 2022-02-22: qty 100

## 2022-02-22 MED ORDER — QUETIAPINE FUMARATE 25 MG PO TABS
100.0000 mg | ORAL_TABLET | Freq: Every day | ORAL | Status: DC
  Administered 2022-02-22 – 2022-02-24 (×3): 100 mg via ORAL
  Filled 2022-02-22 (×3): qty 4

## 2022-02-22 MED ORDER — INSULIN ASPART 100 UNIT/ML IJ SOLN
0.0000 [IU] | Freq: Three times a day (TID) | INTRAMUSCULAR | Status: DC
  Administered 2022-02-23 – 2022-02-24 (×2): 2 [IU] via SUBCUTANEOUS
  Administered 2022-02-24 – 2022-02-25 (×3): 1 [IU] via SUBCUTANEOUS
  Filled 2022-02-22 (×4): qty 1

## 2022-02-22 MED ORDER — HYDRALAZINE HCL 20 MG/ML IJ SOLN: 5.0000 mg | INTRAMUSCULAR | Status: DC | PRN

## 2022-02-22 MED ORDER — ASCORBIC ACID 500 MG PO TABS
500.0000 mg | ORAL_TABLET | Freq: Every day | ORAL | Status: DC
  Administered 2022-02-23 – 2022-02-25 (×3): 500 mg via ORAL
  Filled 2022-02-22 (×3): qty 1

## 2022-02-22 MED ORDER — INSULIN ASPART 100 UNIT/ML IJ SOLN: 0.0000 [IU] | Freq: Every day | INTRAMUSCULAR | Status: DC

## 2022-02-22 MED ORDER — VANCOMYCIN HCL 2000 MG/400ML IV SOLN
2000.0000 mg | Freq: Once | INTRAVENOUS | Status: AC
  Administered 2022-02-22: 2000 mg via INTRAVENOUS
  Filled 2022-02-22: qty 400

## 2022-02-22 MED ORDER — ACETAMINOPHEN 500 MG PO TABS
1000.0000 mg | ORAL_TABLET | Freq: Once | ORAL | Status: AC
  Administered 2022-02-22: 1000 mg via ORAL
  Filled 2022-02-22: qty 2

## 2022-02-22 MED ORDER — ONDANSETRON HCL 4 MG/2ML IJ SOLN: 4.0000 mg | Freq: Three times a day (TID) | INTRAMUSCULAR | Status: DC | PRN

## 2022-02-22 NOTE — Assessment & Plan Note (Signed)
2D echo on 10/07/2021 showed EF 60-65% with grade 3 diastolic dysfunction.  Patient does not have leg edema.  No JVD.  BNP 37.8.  CHF is compensated. -Watch volume status closely

## 2022-02-22 NOTE — Progress Notes (Signed)
HIEP, OLLIS ([id number]) Visit Report for 02/22/2022 Chief Complaint Document Details Patient Name: [name], [name] Date of Service: 02/22/2022 9:45 AM Medical Record Number: [id number] Patient Account Number: [id number] Date of Birth/Sex: August 17, 1955 (65 y.o. M) Treating RN: [name] Primary Care Provider: Dione Booze Other Clinician: Betha Loa Referring Provider: Dione Booze Treating Provider/Extender: Rowan Blase in Treatment: 13 Information Obtained from: Patient Chief Complaint Sacral, right gluteal, and bilateral heel ulcers Electronic Signature(s) Signed: 02/22/2022 9:56:47 AM By: Lenda Kelp PA-C Entered By: Lenda Kelp on 02/22/2022 09:56:47 [name], [name] ([id number]) -------------------------------------------------------------------------------- Problem List Details Patient Name: [name] Date of Service: 02/22/2022 9:45 AM Medical Record Number: [id number] Patient Account Number: [id number] Date of Birth/Sex: [birth year] (65 y.o. M) Treating RN: [name] Primary Care Provider: Dione Booze Other Clinician: Betha Loa Referring Provider: Dione Booze Treating Provider/Extender: Rowan Blase in Treatment: 13 Active Problems ICD-10 Encounter Code Description Active Date MDM Diagnosis L89.613 Pressure ulcer of right heel, stage 3 11/22/2021 No Yes L89.623 Pressure ulcer of left heel, stage 3 11/22/2021 No Yes L89.154 Pressure ulcer of sacral region, stage 4 11/22/2021 No Yes L24.A0 Irritant contact dermatitis due to friction or contact with body fluids, 11/22/2021 No Yes unspecified L98.412 Non-pressure chronic ulcer of buttock with fat layer exposed 11/22/2021 No Yes M62.81 Muscle weakness (generalized) 11/22/2021 No Yes F31.9 Bipolar disorder, unspecified 11/22/2021 No Yes E11.622 Type 2 diabetes mellitus with other skin ulcer 11/22/2021 No Yes I10 Essential (primary) hypertension  11/22/2021 No Yes Z79.01 Long term (current) use of anticoagulants 11/22/2021 No Yes 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 11/22/2021 No Yes Inactive Problems Resolved Problems DEMORRIS, [name] ([id number]) Electronic Signature(s) Signed: 02/22/2022 9:56:41 AM By: Lenda Kelp PA-C Entered By: Lenda Kelp on 02/22/2022 09:56:41

## 2022-02-22 NOTE — Assessment & Plan Note (Signed)
Eliquis 

## 2022-02-22 NOTE — Assessment & Plan Note (Signed)
  BMI=34.22   and BW= 96.2 -Diet and exercise.   -Encourage to lose weight.

## 2022-02-22 NOTE — Assessment & Plan Note (Signed)
-   Crestor 

## 2022-02-22 NOTE — Assessment & Plan Note (Addendum)
-   Last  Change was 1 week ago

## 2022-02-22 NOTE — Assessment & Plan Note (Signed)
Patient has tenderness, erythema and warmth in left upper thigh, indicating possible cellulitis. -see above

## 2022-02-22 NOTE — Sepsis Progress Note (Signed)
Notified bedside nurse of need to draw repeat lactic acid. 

## 2022-02-22 NOTE — H&P (Signed)
History and Physical    [name] HUT:[id number] DOB: 07-10-1955 DOA: 02/22/2022  Referring MD/NP/PA:   PCP: [name], MD   Patient coming from:  The patient is coming from home.     Chief Complaint: fever  HPI: [name] is a 66 y.o. 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  Patient states that he has fever and chills for more than 2 days.  Temperature was 101 at home today.  Patient has generalized weakness.  He also reports pain and mild erythema in left inner thigh.  Denies chest pain or shortness breath.  Patient has mild dry cough.  No nausea, vomiting, diarrhea or abdominal pain.  He had the indwelling catheter changed last week.  Data reviewed independently and ED Course: pt was found to have WBC 11.9, positive urinalysis (yellow appearance, large amount of leukocyte, positive nitrite, many bacteria, WBC> 50), lactic acid 3.5, INR 1.6, PTT 40, BNP 37.8, sodium 129, worsening renal function with creatinine 2.09, BUN 25, GFR 34 (recent baseline creatinine 1.6 on 01/14/2022), temperature one 1.7, blood pressure 166/112, heart rate 120, 110s, RR 28, oxygen saturation 94-100% on room air.  Chest x-ray negative.  Patient is admitted to PCU as inpatient.   EKG: I have personally reviewed.  Sinus rhythm, QTc 433, heart rate 122, nonspecific T wave change.   Review of Systems:   General: Has fevers, chills, no body weight gain, has fatigue HEENT: no blurry vision, hearing changes or sore throat Respiratory: no dyspnea, has mild dry coughing, no wheezing CV: no chest pain, no palpitations GI: no nausea, vomiting, abdominal pain, diarrhea, constipation GU: no dysuria, burning on urination,  increased urinary frequency, hematuria  Ext: no leg edema Neuro: no unilateral weakness, numbness, or tingling, no vision change or hearing loss Skin: no rash, no skin tear. Has pain and erythema in left inner thigh area MSK: No muscle spasm, no deformity, no limitation of range of movement in spin Heme: No easy bruising.  Travel history: No recent long distant travel.   Allergy: No Known Allergies  Past Medical History:  Diagnosis Date   Bipolar disorder ([place])    Diabetes mellitus without complication ([place])    History of blood clots    Hypertension    TBI (traumatic brain injury) ([place])     Past Surgical History:  Procedure Laterality Date   BACK SURGERY     CARPAL TUNNEL RELEASE Bilateral    COLON SURGERY     COLOSTOMY     IR PERC TUN PERIT CATH WO PORT S&I /IMAG  10/11/2021   IR REMOVAL TUN CV CATH W/O FL  11/19/2021   IR US GUIDE VASC ACCESS RIGHT  10/11/2021   TONSILLECTOMY      Social History:  reports that he has never smoked. He has never been exposed to tobacco smoke. He has never used smokeless tobacco. He reports current alcohol use. He reports that he does not use drugs.  Family History:  Family History  Problem Relation Age of Onset   Depression Father    Deep vein thrombosis Father      Prior to Admission medications   Medication Sig Start Date End Date Taking? Authorizing Provider  amLODipine-olmesartan (AZOR) 5-20 MG tablet Take 1 tablet by mouth daily. 01/08/22   [provider]  ascorbic acid (VITAMIN C) 500 MG tablet Take 1 tablet (500 mg total) by mouth daily. 07/31/21   Fritzi Mandes, MD  cholecalciferol (VITAMIN D) 25 MCG tablet Take 1 tablet (1,000 Units total) by mouth daily. 07/31/21   Fritzi Mandes, MD  ELIQUIS 5 MG TABS tablet Take 5 mg by mouth 2 (two) times daily.  11/17/18   [provider]  fish oil-omega-3 fatty acids 1000 MG capsule SMARTSIG:1 Capsule(s) By Mouth Daily 10/12/21   [provider]  glipiZIDE (GLUCOTROL XL)  2.5 MG 24 hr tablet Take 1 tablet (2.5 mg total) by mouth daily with breakfast. 06/25/21   Loletha Grayer, MD  memantine (NAMENDA) 10 MG tablet Take 10 mg by mouth 2 (two) times daily.    [provider]  Oxcarbazepine (TRILEPTAL) 300 MG tablet Take 1 tablet (300 mg total) by mouth 2 (two) times daily. 12/24/21   Ursula Alert, MD  QUEtiapine (SEROQUEL) 100 MG tablet Take 1 tablet (100 mg total) by mouth at bedtime. 12/24/21   Ursula Alert, MD  QUEtiapine (SEROQUEL) 50 MG tablet TAKE 1/2 TO 1 (ONE-HALF TO ONE) TABLET BY MOUTH ONCE DAILY AS NEEDED FOR  AGITATION 12/24/21   Ursula Alert, MD  rosuvastatin (CRESTOR) 20 MG tablet Take 20 mg by mouth daily.  08/29/17   [provider]  vitamin B-12 (CYANOCOBALAMIN) 1000 MCG tablet Take 3,000 mcg by mouth daily.    [provider]  zinc sulfate 220 (50 Zn) MG capsule Take 1 capsule (220 mg total) by mouth daily. 07/31/21   Fritzi Mandes, MD    Physical Exam: Vitals:   02/22/22 1615 02/22/22 1630 02/22/22 1645 02/22/22 1700  BP:  121/66    Pulse: 97 94 89   Resp: _0 Temp:    98.8 F (37.1 C)  TempSrc:    Oral  SpO2: 98% 99% 100%   Weight:      Height:       General: Not in acute distress HEENT:       Eyes: PERRL, EOMI, no scleral icterus.       ENT: No discharge from the ears and nose, no pharynx injection, no tonsillar enlargement.        Neck: No JVD, no bruit, no mass felt. Heme: No neck lymph node enlargement. Cardiac: S1/S2, RRR, No murmurs, No gallops or rubs. Respiratory: No rales, wheezing, rhonchi or rubs. GI: Soft, nondistended, nontender, no rebound pain, no organomegaly, BS present.  S/p of colostomy in left lower quadrant.  GU: No hematuria. S/p of indwelling Foley catheter. Ext: No pitting leg edema bilaterally. 1+DP/PT pulse bilaterally. Musculoskeletal: No joint deformities, No joint redness or warmth, no limitation of ROM in spin. Skin: Has mild erythema in left upper inner thigh with  tenderness and warmth Neuro: Alert, oriented X3, cranial nerves II-XII grossly intact, moves all extremities normally.  Psych: Patient is not psychotic, no suicidal or hemocidal ideation.  Labs on Admission: I have personally reviewed following labs and imaging studies  CBC: Recent Labs  Lab 02/22/22 1218  WBC 11.9*  NEUTROABS 10.2*  HGB 9.1*  HCT 30.0*  MCV 84.5  PLT 295   Basic Metabolic Panel: Recent Labs  Lab 02/22/22 1258 02/22/22 1436  NA 129* 128*  K 4.7 4.6  CL 100 103  CO2 19* 20*  GLUCOSE 291* 242*  BUN 25* 24*  CREATININE 2.09* 2.04*  CALCIUM 8.5* 8.2*   GFR: Estimated Creatinine Clearance: 38.7 mL/min (A) (by C-G formula based  on SCr of 2.04 mg/dL (H)). Liver Function Tests: Recent Labs  Lab 02/22/22 1258  AST 20  ALT 14  ALKPHOS 92  BILITOT 0.6  PROT 8.5*  ALBUMIN 2.9*   No results for input(s): "LIPASE", "AMYLASE" in the last 168 hours. No results for input(s): "AMMONIA" in the last 168 hours. Coagulation Profile: Recent Labs  Lab 02/22/22 1235  INR 1.6*   Cardiac Enzymes: No results for input(s): "CKTOTAL", "CKMB", "CKMBINDEX", "TROPONINI" in the last 168 hours. BNP (last 3 results) No results for input(s): "PROBNP" in the last 8760 hours. HbA1C: No results for input(s): "HGBA1C" in the last 72 hours. CBG: No results for input(s): "GLUCAP" in the last 168 hours. Lipid Profile: No results for input(s): "CHOL", "HDL", "LDLCALC", "TRIG", "CHOLHDL", "LDLDIRECT" in the last 72 hours. Thyroid Function Tests: No results for input(s): "TSH", "T4TOTAL", "FREET4", "T3FREE", "THYROIDAB" in the last 72 hours. Anemia Panel: No results for input(s): "VITAMINB12", "FOLATE", "FERRITIN", "TIBC", "IRON", "RETICCTPCT" in the last 72 hours. Urine analysis:    Component Value Date/Time   COLORURINE YELLOW (A) 02/22/2022 1203   APPEARANCEUR CLOUDY (A) 02/22/2022 1203   APPEARANCEUR Cloudy (A) 08/09/2021 1559   LABSPEC 1.018 02/22/2022 1203   PHURINE  5.0 02/22/2022 1203   GLUCOSEU 50 (A) 02/22/2022 1203   HGBUR MODERATE (A) 02/22/2022 1203   BILIRUBINUR NEGATIVE 02/22/2022 1203   BILIRUBINUR Negative 08/09/2021 1559   KETONESUR NEGATIVE 02/22/2022 1203   PROTEINUR >=300 (A) 02/22/2022 1203   NITRITE POSITIVE (A) 02/22/2022 1203   LEUKOCYTESUR LARGE (A) 02/22/2022 1203   Sepsis Labs: _0 (procalcitonin:4,lacticidven:4) ) Recent Results (from the past 240 hour(s))  Resp Panel by RT-PCR (Flu A&B, Covid) Anterior Nasal Swab     Status: None   Collection Time: 02/22/22  1:25 PM   Specimen: Anterior Nasal Swab  Result Value Ref Range Status   SARS Coronavirus 2 by RT PCR NEGATIVE NEGATIVE Final    Comment: (NOTE) SARS-CoV-2 target nucleic acids are NOT DETECTED.  The SARS-CoV-2 RNA is generally detectable in upper respiratory specimens during the acute phase of infection. The lowest concentration of SARS-CoV-2 viral copies this assay can detect is 138 copies/mL. A negative result does not preclude SARS-Cov-2 infection and should not be used as the sole basis for treatment or other patient management decisions. A negative result may occur with  improper specimen collection/handling, submission of specimen other than nasopharyngeal swab, presence of viral mutation(s) within the areas targeted by this assay, and inadequate number of viral copies(<138 copies/mL). A negative result must be combined with clinical observations, patient history, and epidemiological information. The expected result is Negative.  Fact Sheet for Patients:  EntrepreneurPulse.com.au  Fact Sheet for Healthcare Providers:  IncredibleEmployment.be  This test is no t yet approved or cleared by the [place] FDA and  has been authorized for detection and/or diagnosis of SARS-CoV-2 by FDA under an Emergency Use Authorization (EUA). This EUA will remain  in effect (meaning this test can be used) for the duration of  the COVID-19 declaration under Section 564(b)(1) of the Act, 21 U.S.C.section 360bbb-3(b)(1), unless the authorization is terminated  or revoked sooner.       Influenza A by PCR NEGATIVE NEGATIVE Final   Influenza B by PCR NEGATIVE NEGATIVE Final    Comment: (NOTE) The Xpert Xpress SARS-CoV-2/FLU/RSV plus assay is intended as an aid in the diagnosis of influenza from Nasopharyngeal swab specimens and should not be used as a sole basis for treatment. Nasal washings and aspirates are unacceptable  for Xpert Xpress SARS-CoV-2/FLU/RSV testing.  Fact Sheet for Patients: EntrepreneurPulse.com.au  Fact Sheet for Healthcare Providers: IncredibleEmployment.be  This test is not yet approved or cleared by the [place] FDA and has been authorized for detection and/or diagnosis of SARS-CoV-2 by FDA under an Emergency Use Authorization (EUA). This EUA will remain in effect (meaning this test can be used) for the duration of the COVID-19 declaration under Section 564(b)(1) of the Act, 21 U.S.C. section 360bbb-3(b)(1), unless the authorization is terminated or revoked.  Performed at [facility], [address] [place], [place] [address]      Radiological Exams on Admission: DG Chest 2 View  Result Date: 02/22/2022 CLINICAL DATA:  Suspected sepsis. EXAM: CHEST - 2 VIEW COMPARISON:  Chest x-ray 01/12/2022. FINDINGS: No consolidation. No visible pleural effusion or pneumothorax. Possible enlargement of the pulmonary arteries. Cardiomediastinal silhouette is otherwise within normal limits. No evidence of acute osseous abnormality. Polyarticular degenerative change. IMPRESSION: 1. No consolidation. 2. Possible enlargement of the pulmonary arteries, as can be seen with pulmonary arterial hypertension. Electronically Signed   By: Margaretha Sheffield M.D.   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  Lactic acid 3.5.  Patient has mild leukocytosis to 11.9.   -Admitted to PCU as inpatient -Antibiotics: Vancomycin, cefepime and Flagyl -Follow-up blood culture and urine culture -IV fluid: 1L of LR in ED, 1L of NS, then 100 cc/h -Trend lactic acid level -Check procalcitonin level -Check ESR, CRP for left inner thigh cellulitis  Cellulitis of left thigh Patient has tenderness, erythema and warmth in left upper thigh, indicating possible cellulitis. -see above  Complicated UTI (urinary tract infection) -see above  Type II diabetes mellitus with renal manifestations (HCC) Recent A1c 6.8, well controlled.  Patient is taking glipizide at home -Sliding scale insulin  Essential hypertension -Hold amlodipine- olmesartan due to severe sepsis and high risk of developing hypotension -IV hydralazine as needed  Chronic indwelling Foley catheter - Last  Change was 1 week ago   Obesity with body mass index of 30.0-39.9  BMI=34.22   and BW= 96.2 -Diet and exercise.   -Encourage to lose weight.   Chronic diastolic CHF (congestive heart failure) (HCC) 2D echo on 10/07/2021 showed EF 60-65% with grade 3 diastolic dysfunction.  Patient does not  have leg edema.  No JVD.  BNP 37.8.  CHF is compensated. -Watch volume status closely  Acute renal failure superimposed on stage 3b chronic kidney disease ([place]) Likely due to UTI -Follow-up with BMP -Treat UTI as above  TBI (traumatic brain injury) ([place]) - Has cognitive impairment -Continue Namenda  Hyponatremia Sodium 129.  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     Disposition Plan:  Anticipate discharge back to previous environment  Consults called:  none  Admission status and Level of care: Progressive:     as inpt       Dispo: The patient is from: Home              Anticipated d/c is to: Home              Anticipated d/c date is: 2 days              Patient currently is not medically stable to d/c.    Severity of Illness:  The appropriate patient status for this patient is INPATIENT. Inpatient status is judged to be reasonable and necessary in order to provide the required intensity of service to ensure the patient's safety. The patient's presenting symptoms, physical exam findings, and initial radiographic and laboratory data in the context of their chronic comorbidities is felt to place them at high risk for further clinical deterioration. Furthermore, it is not anticipated that the patient will be medically stable for discharge from the hospital within 2 midnights of admission.   * I certify that at the point of admission it is my clinical judgment that the patient will require inpatient hospital care spanning beyond 2 midnights from the point of admission due to high intensity of  service, high risk for further deterioration and high frequency of surveillance required.*       Date of Service 02/22/2022    Ivor Costa [facility]   If 7PM-7AM, please contact night-coverage [email or web address] 02/22/2022, 6:07 PM

## 2022-02-22 NOTE — ED Provider Notes (Signed)
[facility] Provider Note    Event Date/Time   First MD Initiated Contact with Patient 02/22/22 1153     (approximate)  History   Chief Complaint: Fever (Since 2d)  HPI  [name] is a 66 y.o. male with a past medical history of bipolar disease, diabetes, hypertension, traumatic brain injury status post colostomy and chronic indwelling Foley catheter who presents to the emergency department for increased weakness and fever.  According to family over the past 2 days the patient has been experiencing fevers as high as 101.  Wife denies any obvious cough or congestion, has an indwelling Foley catheter.  No obvious vomiting or diarrhea.  Does have a history of recurrent cellulitis.  Patient is somewhat pale upon presentation.  Physical Exam   Triage Vital Signs: ED Triage Vitals  Enc Vitals Group     BP 02/22/22 1152 132/60     Pulse Rate 02/22/22 1152 (!) 116     Resp 02/22/22 1152 (!) 24     Temp 02/22/22 1152 97.7 F (36.5 C)     Temp Source 02/22/22 1152 Oral     SpO2 02/22/22 1152 100 %     Weight 02/22/22 1153 212 lb (96.2 kg)     Height 02/22/22 1153 5\' 6"  (1.676 m)     Head Circumference --      Peak Flow --      Pain Score 02/22/22 1152 0     Pain Loc --      Pain Edu? --      Excl. in GC? --     Most recent vital signs: Vitals:   02/22/22 1152  BP: 132/60  Pulse: (!) 116  Resp: (!) 24  Temp: 97.7 F (36.5 C)  SpO2: 100%    General: Awake, no distress.  CV:  Good peripheral perfusion.  Regular rhythm rate around 120 bpm. Resp:  Normal effort.  Equal breath sounds bilaterally.  No obvious wheeze rales or rhonchi. Abd:  No distention.  Soft, nontender.  No rebound or guarding.  Colostomy present, well-appearing. Other:  Chronic indwelling Foley catheter.  Patient has 1+ to 2+ lower extremity edema bilaterally nontender no obvious erythema.   ED Results / Procedures / Treatments   EKG  EKG viewed and interpreted by  myself shows sinus tachycardia at 122 bpm with a narrow QRS, normal axis, normal intervals, no concerning ST changes.  RADIOLOGY  I reviewed the patient's chest x-ray images.  On my interpretation there is no obvious consolidation.   MEDICATIONS ORDERED IN ED: Medications  lactated ringers infusion (has no administration in time range)  lactated ringers bolus 1,000 mL (has no administration in time range)  ceFEPIme (MAXIPIME) 2 g in sodium chloride 0.9 % 100 mL IVPB (has no administration in time range)  metroNIDAZOLE (FLAGYL) IVPB 500 mg (has no administration in time range)  vancomycin (VANCOCIN) IVPB 1000 mg/200 mL premix (has no administration in time range)     IMPRESSION / MDM / ASSESSMENT AND PLAN / ED COURSE  I reviewed the triage vital signs and the nursing notes.  Patient's presentation is most consistent with acute presentation with potential threat to life or bodily function.  Patient presents to the emergency department for fever as high as 101 at home per wife over the past 2 days along with increased weakness.  Patient is quite pale as well as tachycardic around 115 to 120 bpm.  Given the reported fever we will check blood cultures  start broad-spectrum antibiotics.  We will check labs including a CBC to ensure patient is not anemic given his pale color.  We will check for COVID we will obtain a chest x-ray we will IV hydrate while awaiting results.  Patient and wife agreeable to plan of care.  Patient's work-up shows chemistry with renal insufficiency, somewhat worse compared to historical values.  CBC shows mild leukocytosis 11.9 chronic anemia.  Urinalysis shows many bacteria greater than 50 white cells nitrite positive.  This is a chronic Foley patient however.  Lactic acid is elevated at 3.5.  Patient receiving broad-spectrum antibiotics as well as IV fluids.  Patient is now febrile to 101.7.  We will dose Tylenol this should help the fever as well as the tachycardia.  We  will continue with IV fluids and we will admit to the hospital service for ongoing work-up and treatment.  CRITICAL CARE Performed by: Minna Antis   Total critical care time: 30 minutes  Critical care time was exclusive of separately billable procedures and treating other patients.  Critical care was necessary to treat or prevent imminent or life-threatening deterioration.  Critical care was time spent personally by me on the following activities: development of treatment plan with patient and/or surrogate as well as nursing, discussions with consultants, evaluation of patient's response to treatment, examination of patient, obtaining history from patient or surrogate, ordering and performing treatments and interventions, ordering and review of laboratory studies, ordering and review of radiographic studies, pulse oximetry and re-evaluation of patient's condition.   FINAL CLINICAL IMPRESSION(S) / ED DIAGNOSES   Fever Weakness Sepsis Urinary tract infection  Note:  This document was prepared using Dragon voice recognition software and may include unintentional dictation errors.   Minna Antis, MD 02/22/22 1355

## 2022-02-22 NOTE — Progress Notes (Signed)
JESIEL, GARATE ([id number]) Visit Report for 02/22/2022 Arrival Information Details Patient Name: [name], [name] Date of Service: 02/22/2022 9:45 AM Medical Record Number: [id number] Patient Account Number: [id number] Date of Birth/Sex: [birth year] (66 y.o. M) Treating RN: [name] Primary Care Nyles Mitton: Dione Booze Other Clinician: Betha Loa Referring Ilona Colley: Dione Booze Treating Sophia Cubero/Extender: Rowan Blase in Treatment: 13 Visit Information History Since Last Visit All ordered tests and consults were completed: No Patient Arrived: Wheel Chair Added or deleted any medications: No Arrival Time: 10:21 Any new allergies or adverse reactions: No Transfer Assistance: EasyPivot Patient Lift Had a fall or experienced change in No Patient Requires Transmission-Based No activities of daily living that may affect Precautions: risk of falls: Patient Has Alerts: Yes Signs or symptoms of abuse/neglect since last visito No Patient Alerts: Patient on Blood Hospitalized since last visit: No Thinner Implantable device outside of the clinic excluding No cellular tissue based products placed in the center since last visit: Pain Present Now: No Electronic Signature(s) Unsigned Entered By: Betha Loa on 02/22/2022 10:22:31 Signature(s): Date(s): [name] ([id number]) -------------------------------------------------------------------------------- Pain Assessment Details Patient Name: [name] Date of Service: 02/22/2022 9:45 AM Medical Record Number: [id number] Patient Account Number: [id number] Date of Birth/Sex: Aug 24, 1955 (66 y.o. M) Treating RN: [name] Primary Care Charnay Nazario: Dione Booze Other Clinician: Betha Loa Referring Venisa Frampton: Dione Booze Treating Zainab Crumrine/Extender: Rowan Blase in Treatment: 13 Active Problems Location of Pain Severity and Description of Pain Patient Has Paino  No Site Locations Pain Management and Medication Current Pain Management: Electronic Signature(s) Unsigned Entered By: Betha Loa on 02/22/2022 10:22:47 Signature(s): Date(s): [name] ([id number]) -------------------------------------------------------------------------------- Vitals Details Patient Name: [name] Date of Service: 02/22/2022 9:45 AM Medical Record Number: [id number] Patient Account Number: [id number] Date of Birth/Sex: 08-29-1955 (66 y.o. M) Treating RN: [name] Primary Care Rajveer Handler: Dione Booze Other Clinician: Betha Loa Referring Dima Ferrufino: Dione Booze Treating Zanyah Lentsch/Extender: Rowan Blase in Treatment: 13 Vital Signs Time Taken: 10:23 Temperature (F): 98.3 Height (in): 66 Pulse (bpm): 105 Weight (lbs): 279 Respiratory Rate (breaths/min): 16 Body Mass Index (BMI): 45 Blood Pressure (mmHg): 148/75 Reference Range: 80 - 120 mg / dl Electronic Signature(s) Unsigned Entered ByBetha Loa on 02/22/2022 10:25:44 Signature(s): Date(s):

## 2022-02-22 NOTE — Assessment & Plan Note (Signed)
-   Has cognitive impairment -Continue Namenda

## 2022-02-22 NOTE — Assessment & Plan Note (Signed)
-  Hold amlodipine- olmesartan due to severe sepsis and high risk of developing hypotension -IV hydralazine as needed

## 2022-02-22 NOTE — Consult Note (Addendum)
Pharmacy Antibiotic Note  [name] is a 66 y.o. male admitted on 02/22/2022 with cellulitis.  Pharmacy has been consulted for vancomycin dosing.  8/25 Vancomycin 2000mg  IV and Cefepime 2g IV in the ED  Plan: Vancomycin 750mg  IV every 24 hours Goal AUC 400-550  Est AUC: 512.2 Est Cmax: 30.1 Est Cmin: 14.9 Calculated with SCr 2.09  Vd coefficient of 0.5 L/kg    Height: 5\' 6"  (167.6 cm) Weight: 96.2 kg (212 lb) IBW/kg (Calculated) : 63.8  Temp (24hrs), Avg:99.7 F (37.6 C), Min:97.7 F (36.5 C), Max:101.7 F (38.7 C)  Recent Labs  Lab 02/22/22 1203 02/22/22 1218 02/22/22 1258  WBC  --  11.9*  --   CREATININE  --   --  2.09*  LATICACIDVEN 3.5*  --   --     Estimated Creatinine Clearance: 37.8 mL/min (A) (by C-G formula based on SCr of 2.09 mg/dL (H)).    No Known Allergies  Antimicrobials this admission: 8/25 Cefepime >> 8/25 Vancomycin >>    Microbiology results: 8/25 BCx: sent 8/25 UCx: sent  Thank you for allowing pharmacy to be a part of this patient's care.  9/25 02/22/2022 3:30 PM

## 2022-02-22 NOTE — Code Documentation (Signed)
CODE SEPSIS - PHARMACY COMMUNICATION  **Broad Spectrum Antibiotics should be administered within 1 hour of Sepsis diagnosis**  Time Code Sepsis Called/Page Received: 1203  Antibiotics Ordered: Flagyl + Cefepime  Time of 1st antibiotic administration: 1257     Selinda Eon ,PharmD Clinical Pharmacist  02/22/2022  12:23 PM

## 2022-02-22 NOTE — Assessment & Plan Note (Signed)
Severe sepsis due to complicated UTI and possible left inner thigh cellulitis: Patient meets criteria for severe sepsis with fever 101.7, heart rate up to 121, RR 28.  Lactic acid 3.5.  Patient has mild leukocytosis to 11.9.   -Admitted to PCU as inpatient -Antibiotics: Vancomycin, cefepime and Flagyl -Follow-up blood culture and urine culture -IV fluid: 1L of LR in ED, 1L of NS, then 100 cc/h -Trend lactic acid level -Check procalcitonin level -Check ESR, CRP for left inner thigh cellulitis

## 2022-02-22 NOTE — Assessment & Plan Note (Signed)
-   Continue Tegretol, Seroquel

## 2022-02-22 NOTE — Assessment & Plan Note (Signed)
-  see above 

## 2022-02-22 NOTE — Assessment & Plan Note (Signed)
Likely due to UTI -Follow-up with BMP -Treat UTI as above

## 2022-02-22 NOTE — Consult Note (Signed)
PHARMACY -  BRIEF ANTIBIOTIC NOTE   Pharmacy has received consult(s) for Cefepime + Vancomycin from an ED provider.  The patient's profile has been reviewed for ht/wt/allergies/indication/available labs.    One time order(s) placed for Cefepime 2g IV x1. Vancomycin 2g IV x1  Further antibiotics/pharmacy consults should be ordered by admitting physician if indicated.                       Thank you, Selinda Eon 02/22/2022  12:23 PM

## 2022-02-22 NOTE — ED Notes (Signed)
Pt turned to the right side, verbalizes increased comfort

## 2022-02-22 NOTE — Assessment & Plan Note (Signed)
Sodium 129.  Mental status at baseline -Check osmolality of plasma and urine, check urine sodium level -Fluid restriction -IV fluid as above -Check BMP q 8-hour

## 2022-02-22 NOTE — Sepsis Progress Note (Signed)
[facility] is following this Code Sepsis. 

## 2022-02-22 NOTE — ED Notes (Signed)
First Nurse Note: Pt to ED via POV from the wound center for possible sepsis. Pt has been running fever and has pressure wounds.

## 2022-02-22 NOTE — ED Notes (Signed)
Dr. Lenard Lance notified pt's temp 101.7, will order tylenol

## 2022-02-22 NOTE — Assessment & Plan Note (Signed)
Recent A1c 6.8, well controlled.  Patient is taking glipizide at home -Sliding scale insulin

## 2022-02-22 NOTE — ED Triage Notes (Signed)
Pt to ED with wife, from home POV for fever since 2 days ago. Temp has been 101, wife has been giving pt tylenol, last dose yesterday.  Pt has indwelling foley catheter, unsure since when. Also LLQ colostomy, bag replaced this morning, stoma brick red.  Pt is pale, tachypneic and tachycardic at this time. Afebrile. Bilateral lower extremities tight and swollen. Wife states hx cellulitis.

## 2022-02-23 ENCOUNTER — Other Ambulatory Visit: Payer: Self-pay

## 2022-02-23 DIAGNOSIS — R6520 Severe sepsis without septic shock: Secondary | ICD-10-CM | POA: Diagnosis not present

## 2022-02-23 DIAGNOSIS — A419 Sepsis, unspecified organism: Secondary | ICD-10-CM | POA: Diagnosis not present

## 2022-02-23 LAB — BASIC METABOLIC PANEL
Anion gap: 5 (ref 5–15)
BUN: 24 mg/dL — ABNORMAL HIGH (ref 8–23)
CO2: 19 mmol/L — ABNORMAL LOW (ref 22–32)
Calcium: 8 mg/dL — ABNORMAL LOW (ref 8.9–10.3)
Chloride: 106 mmol/L (ref 98–111)
Creatinine, Ser: 1.98 mg/dL — ABNORMAL HIGH (ref 0.61–1.24)
GFR, Estimated: 37 mL/min — ABNORMAL LOW (ref >60)
Glucose, Bld: 135 mg/dL — ABNORMAL HIGH (ref 70–99)
Potassium: 4 mmol/L (ref 3.5–5.1)
Sodium: 130 mmol/L — ABNORMAL LOW (ref 135–145)

## 2022-02-23 LAB — CBG MONITORING, ED
Glucose-Capillary: 114 mg/dL — ABNORMAL HIGH (ref 70–99)
Glucose-Capillary: 187 mg/dL — ABNORMAL HIGH (ref 70–99)

## 2022-02-23 LAB — CBC
HCT: 25.6 % — ABNORMAL LOW (ref 39.0–52.0)
Hemoglobin: 7.8 g/dL — ABNORMAL LOW (ref 13.0–17.0)
MCH: 24.8 pg — ABNORMAL LOW (ref 26.0–34.0)
MCHC: 30.5 g/dL (ref 30.0–36.0)
MCV: 81.5 fL (ref 80.0–100.0)
Platelets: 209 10*3/uL (ref 150–400)
RBC: 3.14 MIL/uL — ABNORMAL LOW (ref 4.22–5.81)
RDW: 16.7 % — ABNORMAL HIGH (ref 11.5–15.5)
WBC: 8.7 10*3/uL (ref 4.0–10.5)
nRBC: 0 % (ref 0.0–0.2)

## 2022-02-23 LAB — GLUCOSE, CAPILLARY
Glucose-Capillary: 105 mg/dL — ABNORMAL HIGH (ref 70–99)
Glucose-Capillary: 95 mg/dL (ref 70–99)

## 2022-02-23 MED ORDER — MEROPENEM 1 G IV SOLR
1.0000 g | Freq: Two times a day (BID) | INTRAVENOUS | Status: DC
  Administered 2022-02-23 – 2022-02-25 (×5): 1 g via INTRAVENOUS
  Filled 2022-02-23 (×4): qty 20
  Filled 2022-02-23: qty 1
  Filled 2022-02-23: qty 20

## 2022-02-23 MED ORDER — CHLORHEXIDINE GLUCONATE CLOTH 2 % EX PADS
6.0000 | MEDICATED_PAD | Freq: Every day | CUTANEOUS | Status: DC
  Administered 2022-02-23 – 2022-02-25 (×3): 6 via TOPICAL

## 2022-02-23 NOTE — Progress Notes (Signed)
[facility]  - [place] [facility]   PATIENT NAME: [name]    MR#:  [id number]  DATE OF BIRTH:  1955-12-05  SUBJECTIVE:   Patient seen earlier. Came in from bone center with fever. No family at bedside. Eating his lunch earlier. No fever documented. Overall feels better.   VITALS:  Blood pressure (!) 139/93, pulse 68, temperature 98.2 F (36.8 C), resp. rate 18, height 5\' 6"  (1.676 m), weight 96.2 kg, SpO2 100 %.  PHYSICAL EXAMINATION:   GENERAL:  66 y.o.-year-old patient lying in the bed with no acute distress. Obese chronic LUNGS: Normal breath sounds bilaterally, no wheezing, rales, rhonchi.  CARDIOVASCULAR: S1, S2 normal. No murmurs, rubs, or gallops.  ABDOMEN: Soft, nontender, nondistended. Bowel sounds present.  EXTREMITIES: chronic right lower extremity edema    NEUROLOGIC: nonfocal  patient is alert and awake SKIN:Right foot Left foot   LABORATORY PANEL:  CBC Recent Labs  Lab 02/23/22 0615  WBC 8.7  HGB 7.8*  HCT 25.6*  PLT 209    Chemistries  Recent Labs  Lab 02/22/22 1258 02/22/22 1436 02/23/22 0615  NA 129*   < > 130*  K 4.7   < > 4.0  CL 100   < > 106  CO2 19*   < > 19*  GLUCOSE 291*   < > 135*  BUN 25*   < > 24*  CREATININE 2.09*   < > 1.98*  CALCIUM 8.5*   < > 8.0*  AST 20  --   --   ALT 14  --   --   ALKPHOS 92  --   --   BILITOT 0.6  --   --    < > = values in this interval not displayed.    RADIOLOGY:  DG Chest 2 View  Result Date: 02/22/2022 CLINICAL DATA:  Suspected sepsis. EXAM: CHEST - 2 VIEW COMPARISON:  Chest x-ray 01/12/2022. FINDINGS: No consolidation. No visible pleural effusion or pneumothorax. Possible enlargement of the pulmonary arteries. Cardiomediastinal silhouette is otherwise within normal limits. No evidence of acute osseous abnormality. Polyarticular degenerative change. IMPRESSION: 1. No consolidation. 2. Possible enlargement of the pulmonary arteries, as can be seen with pulmonary  arterial hypertension. Electronically Signed   By: 01/14/2022 M.D.   On: 02/22/2022 12:53    Assessment and Plan  [name] is a 66 y.o. 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   Patient states that he has fever and chills for more than 2 days.  Temperature was 101 at home today.  Patient has generalized weakness.  He also reports pain and mild erythema in left inner thigh.   Severe sepsis (HCC) Severe sepsis due to complicated UTI and possible left inner thigh cellulitis: Patient meets criteria for severe sepsis with fever 101.7, heart rate up to 121, RR 28.  Lactic acid 3.5.  Patient has mild leukocytosis to 11.9.   -Antibiotics: Vancomycin, cefepime and Flagyl-- de-escalate to imipenem wanting to urine culture results -Follow-up blood culture so far negative and urine culture pending - lactic acid level 1.2 - procalcitonin level .3 -- sepsis improving   Cellulitis of left thigh chronic right heel pressure ulcer chronic left heel pressure ulcer Patient has tenderness, erythema and warmth in left upper thigh, indicating possible cellulitis. -see  above -- appears to be stable today. Do not feel any redness. -- Wound consult for bilateral chronic healing wounds   Complicated UTI (urinary tract infection) due to chronic Foley catheter -see above -- patient follows with [facility] urology. His catheter was changed on 15 February 2022   Type II diabetes mellitus with renal manifestations (HCC) Recent A1c 6.8, well controlled.  Patient is taking glipizide at home -Sliding scale insulin   Essential hypertension -Hold amlodipine- olmesartan due to severe sepsis and high risk of developing hypotension -IV  hydralazine as needed   Chronic indwelling Foley catheter - Last  Change was 02/15/22   Obesity with body mass index of 30.0-39.9   BMI=34.22   and BW= 96.2 -Diet and exercise.   -Encourage to lose weight.     Chronic diastolic CHF (congestive heart failure) (HCC) 2D echo on 10/07/2021 showed EF 60-65% with grade 3 diastolic dysfunction.  Patient does have leg edema.  No JVD.  BNP 37.8.  CHF is compensated. -Watch volume status closely   Acute renal failure superimposed on stage 3b chronic kidney disease (HCC) Likely due to UTI -Follow-up with BMP -Treat UTI as above   TBI (traumatic brain injury) (HCC) - Has cognitive impairment -Continue Namenda   Hyponatremia Sodium 129.  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  >50% time spent on counselling and coordination of care  Note: This dictation was prepared with Dragon dictation along with smaller phrase technology. Any transcriptional errors that result from this process are unintentional.  Enedina Finner M.D    [facility]   CC: Primary care physician; Dione Booze, MD

## 2022-02-23 NOTE — TOC Initial Note (Signed)
Transition of Care [facility]) - Initial/Assessment Note    Patient Details  Name: [name] MRN: [id number] Date of Birth: 1956/02/24  [facility] of [facility] CM/SW Contact:    Allayne Butcher, RN Phone Number: 02/23/2022, 3:50 PM  Clinical Narrative:                 Patient admitted to the hospital with severe sepsis.  RNCM was able to meet with patient in the ED before he moved up to the floor.  Patient is from home with his wife.  Patient walks with a walker and also has a wheelchair.  Patient is current with [facility] for RN, PT and OT.  Becky Sax with [facility] notified of admission.   Patient wants to get better and go home and agrees to continued [facility] services at discharge.   Expected Discharge Plan: Home w Home Health Services Barriers to Discharge: Continued Medical Work up   Patient Goals and CMS Choice Patient states their goals for this hospitalization and ongoing recovery are:: to get better and go home CMS Medicare.gov Compare Post Acute Care list provided to:: Patient Choice offered to / list presented to : Patient  Expected Discharge Plan and Services Expected Discharge Plan: Home w Home Health Services   Discharge Planning Services: CM Consult Post Acute Care Choice: Resumption of Svcs/PTA Provider, Home Health Living arrangements for the past 2 months: Single Family Home                 DME Arranged: N/A DME Agency: NA       HH Arranged: RN, PT, OT HH Agency: [facility] Date HH Agency Contacted: 02/23/22 Time HH Agency Contacted: [phone] Representative spoke with at [facility] Agency: Becky Sax  Prior Living Arrangements/Services Living arrangements for the past 2 months: Single Family Home Lives with:: Spouse Patient language and need for interpreter reviewed:: Yes Do you feel safe going back to the place where you live?: Yes      Need for Family Participation in Patient Care: Yes (Comment) Care giver support system in place?: Yes  (comment) (spouse) Current home services: DME, Home PT, Home OT, Home RN (walker and wheelchair) Criminal Activity/Legal Involvement Pertinent to Current Situation/Hospitalization: No - Comment as needed  Activities of Daily Living Home Assistive Devices/Equipment: Wheelchair ADL Screening (condition at time of admission) Patient's cognitive ability adequate to safely complete daily activities?: No Is the patient deaf or have difficulty hearing?: No Does the patient have difficulty seeing, even when wearing glasses/contacts?: No Does the patient have difficulty concentrating, remembering, or making decisions?: No Patient able to express need for assistance with ADLs?: Yes Does the patient have difficulty dressing or bathing?: Yes Independently performs ADLs?: No Communication: Independent Dressing (OT): Dependent Walks in Home: Dependent Does the patient have difficulty walking or climbing stairs?: Yes Weakness of Legs: Both Weakness of Arms/Hands: Both  Permission Sought/Granted Permission sought to share information with : Case Manager, Family Supports, Other (comment) Permission granted to share information with : Yes, Verbal Permission Granted  Share Information with NAME: Therlma Svec  Permission granted to share info w AGENCY: [employer]  Permission granted to share info w Relationship: spouse  Permission granted to share info w Contact Information: [phone]  Emotional Assessment Appearance:: Appears stated age Attitude/Demeanor/Rapport: Engaged Affect (typically observed): Accepting Orientation: : Oriented to Self, Oriented to Place, Oriented to  Time, Oriented to Situation Alcohol / Substance Use: Not Applicable Psych Involvement: No (comment)  Admission diagnosis:  Severe sepsis (  [place]) [A41.9, R65.20] Urinary tract infection without hematuria, site unspecified [N39.0] Sepsis, due to unspecified organism, unspecified whether acute organ dysfunction present  [facility]) [A41.9] Patient Active Problem List   Diagnosis Date Noted   Complicated UTI (urinary tract infection) 02/22/2022   Acute renal failure superimposed on stage 3b chronic kidney disease ([place]) 02/22/2022   Chronic diastolic CHF (congestive heart failure) ([place]) 02/22/2022   Obesity with body mass index of 30.0-39.9 02/22/2022   Cellulitis of left thigh 02/22/2022   Essential hypertension 01/13/2022   Seizure prophylaxis 01/13/2022   Acute pyelonephritis 01/05/2022   Fever 12/16/2021   Hypotension 10/09/2021   Abdominal distension 10/08/2021   AKI (acute kidney injury) ([place]) 10/08/2021   Sacral wound 10/07/2021   MSSA bacteremia 10/07/2021   Severe sepsis ([place]) 10/06/2021   Sepsis secondary to UTI ([place]) 07/28/2021   COVID-19 virus infection 07/28/2021   Chronic indwelling Foley catheter 06/22/2021   Colostomy in place [facility]) 06/22/2021   TBI (traumatic brain injury) ([place])    Healed ulcer of foot on examination 04/14/2021   Acute UTI 02/23/2021   Osteomyelitis of vertebra, sacral and sacrococcygeal region ([place]) 10/18/2020   Hyponatremia 07/07/2020   Encephalopathy 07/07/2020   Chronic multifocal osteomyelitis of right foot ([place]) 07/07/2020   Anemia of chronic disease 07/07/2020   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[place]) 06/01/2020   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/13/2020   Urinary retention 03/19/2020   History of DVT (deep vein thrombosis) 03/19/2020   Pressure injury of sacral region, stage 4 ([place]) 03/19/2020   Severe sepsis with septic shock ([place]) 03/18/2020   Obesity, Class III, BMI 40-49.9 (morbid obesity) ([place]) 03/18/2020   Cellulitis 03/18/2020   Acute cystitis without hematuria    Stage 3b chronic kidney disease (CKD) ([place]) - baseline SCr 1.8-1.9 01/20/2020   Decubitus ulcers 01/17/2020   Sepsis ([place]) 01/16/2020   Cellulitis of sacral region 01/16/2020   Acute kidney injury superimposed on chronic kidney disease ([place])  [id number]   Acute metabolic encephalopathy [id number]   Bipolar disorder, in full remission, most recent episode mixed ([place]) 12/29/2019   High risk medication use 10/25/2019   Noncompliance with treatment plan 10/25/2019   Bipolar I disorder, most recent episode mixed ([place]) 01/07/2019   GAD (generalized anxiety disorder) 01/07/2019   Insomnia due to medical condition 01/07/2019   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 ([place]) 03/06/2016   Bipolar disorder ([place]) 01/03/2016   Lithium toxicity 01/02/2016   Chronic anticoagulation - on Eliquis 07/06/2014   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[place]) 07/06/2014   Hyperlipidemia 07/06/2014   Leg swelling 07/06/2014   Obesity, Class II, BMI 35-39.9, with comorbidity 07/06/2014   On medication for venous thromboembolism 07/06/2014   Sleep apnea 07/06/2014   Type II diabetes mellitus with renal manifestations ([place]) 07/06/2014   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   Venous thromboembolism (VTE) 07/06/2014   PCP:  [name], MD Pharmacy:   [facility] [address] [place] [address] [place] [address] Phone: [phone] Fax: [phone]     Social Determinants of Health (SDOH) Interventions    Readmission Risk Interventions    02/23/2022    3:44 PM 01/14/2022    3:23 PM 01/07/2022   10:14 AM  Readmission Risk Prevention Plan  Transportation Screening Complete Complete Complete  Medication Review ([place])  Complete Complete  PCP or Specialist appointment within 3-5 days of discharge Complete  Complete  HRI or Home Care  Consult Complete Complete Complete  SW Recovery Care/Counseling Consult Complete  Complete  Palliative Care Screening Not Applicable Not Applicable Not Applicable  Skilled Nursing Facility Not Applicable  Patient Refused

## 2022-02-24 DIAGNOSIS — I5032 Chronic diastolic (congestive) heart failure: Secondary | ICD-10-CM | POA: Diagnosis not present

## 2022-02-24 DIAGNOSIS — A419 Sepsis, unspecified organism: Secondary | ICD-10-CM | POA: Diagnosis not present

## 2022-02-24 DIAGNOSIS — N179 Acute kidney failure, unspecified: Secondary | ICD-10-CM | POA: Diagnosis not present

## 2022-02-24 DIAGNOSIS — Z978 Presence of other specified devices: Secondary | ICD-10-CM | POA: Diagnosis not present

## 2022-02-24 LAB — GLUCOSE, CAPILLARY
Glucose-Capillary: 119 mg/dL — ABNORMAL HIGH (ref 70–99)
Glucose-Capillary: 149 mg/dL — ABNORMAL HIGH (ref 70–99)
Glucose-Capillary: 151 mg/dL — ABNORMAL HIGH (ref 70–99)
Glucose-Capillary: 175 mg/dL — ABNORMAL HIGH (ref 70–99)

## 2022-02-24 NOTE — Consult Note (Signed)
WOC Nurse Consult Note: Reason for Consult:Chronic, nonhealing pressure injuries to bilateral heels and sacrum. Patient is well known to our team. Last seen by this writer last month on 01/05/22 and by my other associates at least 4 times in the past 6 months. Patient is followed by the outpatient wound care center and was last seen by Provider Sinclair Grooms, III on 02/01/22. See note from that encounter. Patient is followed by [facility].  Wound type:Pressure Pressure Injury POA: Yes Measurement:Per Wound Clinic on 02/01/22: Right heel:  1.9cm x 2.6cm x 0.1cm (red, moist) Left heel: 0.8cm x 2.3cm x 0.1cm (pink, reepithelializing)  Sacrum: 2cm x 2cm x 4.1cm (red, moist, macerated periwound) Wound bed:As noted above Drainage (amount, consistency, odor) Small to heels, moderate to sacrum Periwound:with evidence of previous wound healing (scarring), sacrum with periwound maceration Dressing procedure/placement/frequency: Wound care at the outpatient [facility] is with sodium hypochlorite solution to sacral wound. Patient can resume this at home however, due to periwound maceration, while in house will will dressing daily with silver hydrofiber (Aquacel Ag+ Advantage). The left heel, which is reepithelializing, will be dressed daily with xeroform gauze. The eight heel will be dressing with Aquacel Ag+ Advantage and changed daily. A mattress replacement is provided. Heels are to be floated.    WOC Nurse ostomy consult note Stoma type/location: LLQ colostomy since January 2021 Stomal assessment/size: Note measured today Peristomal assessment: Not observed today Treatment options for stomal/peristomal skin: Skin barrier ring Output: soft stool Ostomy pouching: 2-piece, 2 and 3/4 inch pouching system with skin barrier ring.  Pouch is [employer], skin barrier is [place] #2, skin barrier ring is [place] # [phone] Education provided: None Enrolled patient in [employer] DC program:No. Patient is established with a  provider.  WOC nursing team will not follow, but will remain available to this patient, the nursing and medical teams.  Please re-consult if needed.  Thank you for inviting [place] to participate in this patient's Plan of Care.  Ladona Mow, MSN, RN, CNS, GNP, Leda Min, [employer], [employer] phone:  [phone]

## 2022-02-24 NOTE — Progress Notes (Signed)
[facility]  - [place] at [facility]   PATIENT NAME: [name]    MR#:  [id number]  DATE OF BIRTH:  1955/09/30  SUBJECTIVE:   Patient seen earlier. Came in from wound center with fever. No family at bedside. Eating earlier. No fever  Overall feels better.no pain in the thigs, no redness Has chronic foley   VITALS:  Blood pressure (!) 142/79, pulse 71, temperature 98.2 F (36.8 C), resp. rate 16, height 5\' 6"  (1.676 m), weight 120.1 kg, SpO2 99 %.  PHYSICAL EXAMINATION:   GENERAL:  66 y.o.-year-old patient lying in the bed with no acute distress. Obese chronic LUNGS: Normal breath sounds bilaterally, no wheezing, rales, rhonchi.  CARDIOVASCULAR: S1, S2 normal. No murmurs, rubs, or gallops.  ABDOMEN: Soft, nontender, nondistended. Bowel sounds present.  EXTREMITIES: chronic right lower extremity edema    NEUROLOGIC: nonfocal  patient is alert and awake SKIN:Right foot Left foot   LABORATORY PANEL:  CBC Recent Labs  Lab 02/23/22 0615  WBC 8.7  HGB 7.8*  HCT 25.6*  PLT 209     Chemistries  Recent Labs  Lab 02/22/22 1258 02/22/22 1436 02/23/22 0615  NA 129*   < > 130*  K 4.7   < > 4.0  CL 100   < > 106  CO2 19*   < > 19*  GLUCOSE 291*   < > 135*  BUN 25*   < > 24*  CREATININE 2.09*   < > 1.98*  CALCIUM 8.5*   < > 8.0*  AST 20  --   --   ALT 14  --   --   ALKPHOS 92  --   --   BILITOT 0.6  --   --    < > = values in this interval not displayed.     RADIOLOGY:  No results found.  Assessment and Plan  [name] is a 66 y.o. 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   Patient states that he has fever and chills for more than 2 days.  Temperature was 101 at  home today.  Patient has generalized weakness.  He also reports pain and mild erythema in left inner thigh.   Severe sepsis (HCC) recurrent UTI with indwelling Foley catheter Severe sepsis due to complicated UTI and possible left inner thigh cellulitis: Patient meets criteria for severe sepsis with fever 101.7, heart rate up to 121, RR 28.  Lactic acid 3.5.  Patient has mild leukocytosis to 11.9.   -Antibiotics: Vancomycin, cefepime and Flagyl-- de-escalate to imipenem  --urine culture -- gram-negative rods, staph aureus -Follow-up blood culture so far negative and urine culture pending - lactic acid level 1.2 - procalcitonin level .3 -- sepsis improving  -- given recurrent infections will get ID input  Cellulitis of left thigh chronic right heel pressure ulcer chronic left heel pressure ulcer Patient has tenderness, erythema and warmth in left upper thigh, indicating possible cellulitis. -see above -- appears to be stable today. Do not feel any redness. -- Wound consult for bilateral chronic healing wounds-- appreciate input   Complicated UTI (urinary tract infection) due to chronic Foley catheter -see above -- patient follows with [facility] urology. His catheter was changed on 15 February 2022   Type II diabetes mellitus with renal manifestations (HCC)  Recent A1c 6.8, well controlled.  Patient is taking glipizide at home -Sliding scale insulin   Essential hypertension -Hold amlodipine- olmesartan due to severe sepsis and high risk of developing hypotension -IV hydralazine as needed   Chronic indwelling Foley catheter - Last  Change was 02/15/22   Obesity with body mass index of 30.0-39.9   BMI=34.22   and BW= 96.2 -Diet and exercise.   -Encourage to lose weight.     Chronic diastolic CHF (congestive heart failure) (HCC) 2D echo on 10/07/2021 showed EF 60-65% with grade 3 diastolic dysfunction.  Patient does have leg edema.  No JVD.  BNP 37.8.  CHF is compensated. -Watch  volume status closely   Acute renal failure superimposed on stage 3b chronic kidney disease (HCC) Likely due to UTI -Follow-up with BMP -Treat UTI as above   TBI (traumatic brain injury) (HCC) - Has cognitive impairment -Continue Namenda   Hyponatremia Sodium 129.  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  >50% time spent on counselling and coordination of care  Note: This dictation was prepared with Dragon dictation along with smaller phrase technology. Any transcriptional errors that result from this process are unintentional.  Enedina Finner M.D    [facility]   CC: Primary care physician; Dione Booze, MD

## 2022-02-25 ENCOUNTER — Inpatient Hospital Stay: Payer: Medicare Other

## 2022-02-25 DIAGNOSIS — N179 Acute kidney failure, unspecified: Secondary | ICD-10-CM | POA: Diagnosis not present

## 2022-02-25 DIAGNOSIS — N390 Urinary tract infection, site not specified: Secondary | ICD-10-CM | POA: Diagnosis not present

## 2022-02-25 DIAGNOSIS — Z978 Presence of other specified devices: Secondary | ICD-10-CM | POA: Diagnosis not present

## 2022-02-25 DIAGNOSIS — I5032 Chronic diastolic (congestive) heart failure: Secondary | ICD-10-CM | POA: Diagnosis not present

## 2022-02-25 DIAGNOSIS — A419 Sepsis, unspecified organism: Secondary | ICD-10-CM | POA: Diagnosis not present

## 2022-02-25 DIAGNOSIS — R6520 Severe sepsis without septic shock: Secondary | ICD-10-CM | POA: Diagnosis not present

## 2022-02-25 LAB — BASIC METABOLIC PANEL
Anion gap: 3 — ABNORMAL LOW (ref 5–15)
BUN: 22 mg/dL (ref 8–23)
CO2: 22 mmol/L (ref 22–32)
Calcium: 8.5 mg/dL — ABNORMAL LOW (ref 8.9–10.3)
Chloride: 110 mmol/L (ref 98–111)
Creatinine, Ser: 1.62 mg/dL — ABNORMAL HIGH (ref 0.61–1.24)
GFR, Estimated: 47 mL/min — ABNORMAL LOW (ref >60)
Glucose, Bld: 129 mg/dL — ABNORMAL HIGH (ref 70–99)
Potassium: 4.2 mmol/L (ref 3.5–5.1)
Sodium: 135 mmol/L (ref 135–145)

## 2022-02-25 LAB — GLUCOSE, CAPILLARY
Glucose-Capillary: 128 mg/dL — ABNORMAL HIGH (ref 70–99)
Glucose-Capillary: 148 mg/dL — ABNORMAL HIGH (ref 70–99)
Glucose-Capillary: 117 mg/dL — ABNORMAL HIGH (ref 70–99)

## 2022-02-25 LAB — URINE CULTURE: Culture: >=100000 — AB

## 2022-02-25 MED ORDER — CIPROFLOXACIN HCL 500 MG PO TABS
500.0000 mg | ORAL_TABLET | Freq: Two times a day (BID) | ORAL | Status: DC
Start: 2022-02-25 — End: 2022-02-25
  Filled 2022-02-25: qty 1

## 2022-02-25 MED ORDER — AMLODIPINE-OLMESARTAN 5-20 MG PO TABS: 1.0000 | ORAL_TABLET | Freq: Every day | ORAL | Status: DC

## 2022-02-25 MED ORDER — AMLODIPINE BESYLATE 5 MG PO TABS: 5.0000 mg | ORAL_TABLET | Freq: Every day | ORAL | Status: DC

## 2022-02-25 MED ORDER — IRBESARTAN 150 MG PO TABS: 150.0000 mg | ORAL_TABLET | Freq: Every day | ORAL | Status: DC

## 2022-02-25 MED ORDER — CIPROFLOXACIN HCL 500 MG PO TABS: 500.0000 mg | ORAL_TABLET | Freq: Two times a day (BID) | ORAL | 0 refills | Status: AC

## 2022-02-25 NOTE — Discharge Summary (Incomplete)
Physician Discharge Summary   [name] MRN: [id number] DOB: 1955-11-01  Admit date:     02/22/2022  Discharge date: 02/25/22  Discharge Physician: Fritzi Mandes   PCP: [name], MD   Recommendations at discharge:  {Tip this will not be part of the note when signed- Example include specific recommendations for outpatient follow-up, pending tests to follow-up on. (Optional):26781}  F/u PCP in 1-2 weeks keep your routine appointment with [facility] wound center as before follow-up [place] urology when it's time to get your catheter changed  Discharge Diagnoses:   Hospital Course:  [name] is a 66 y.o. 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   Patient states that he has fever and chills for more than 2 days.  Temperature was 101 at home today.  Patient has generalized weakness.  He also reports pain and mild erythema in left inner thigh.    Severe sepsis (HCC) Recurrent UTI with indwelling Foley catheter Severe sepsis due to complicated UTI and possible left inner thigh cellulitis: Patient meets criteria for severe sepsis with fever 101.7, heart rate up to 121, RR 28.  Lactic acid 3.5.  Patient has mild leukocytosis to 11.9.   -Antibiotics: Vancomycin, cefepime and Flagyl-- de-escalate to imipenem  --urine culture --,ACINETOBACTER CALCOACETICUS/BAUMANNII COMPLEX and staph aureus -Follow-up blood culture so far negative and urine culture pending - lactic acid level 1.2 - procalcitonin level .3 -- sepsis improving  -- given recurrent infections will get ID input regarding abx --US renal-- chronic right mild hydronephrosis in comparison with ultrasound April 2023 -- per ID okay to change to  Cipro for four more days.   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 -see above -- appears to be stable today. Do not feel any redness on the thighs -- Wound consult for bilateral chronic healing wounds-- appreciate input   Complicated UTI (urinary tract infection) due to chronic Foley catheter -see above -- patient follows with [facility]rology. His catheter was changed on 15 February 2022   Type II diabetes mellitus with renal manifestations (HCC) Recent A1c 6.8, well controlled.  Patient is taking glipizide at home -Sliding scale insulin   Essential hypertension -Hold amlodipine- olmesartan due to severe sepsis and high risk of developing hypotension -IV hydralazine as needed   Chronic indwelling Foley catheter - Last  Change was 02/15/22   Obesity with body mass index of 30.0-39.9   BMI=34.22   and BW= 96.2 -Diet and exercise.   -Encourage to lose weight.   Chronic diastolic CHF (congestive heart failure) (HCC) 2D echo on 10/07/2021 showed EF 60-65% with grade 3 diastolic dysfunction.  Patient does have leg edema.  No JVD.  BNP 37.8.  CHF is compensated. -Watch volume status closely   Acute renal failure superimposed on stage 3b chronic kidney disease ([place]) Likely due to UTI -the abdomen improved to 1.6 -Treat UTI as above   TBI (traumatic brain injury) ([place]) - Has cognitive impairment -Continue Namenda   Hyponatremia due to infection/AKI Sodium 129.  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 Will discharged to home. Resume home health   Family communication : wife [name] on the phone today Consults : none CODE STATUS:  FULL DVT Prophylaxis : Level of care: Med-Surg  {Tip this will not be part of the note when signed Body mass index is 42.66 kg/m. , ,  Active Pressure Injury/Wound(s)     Pressure Ulcer  Duration          Pressure Injury Heel Right -- days   Pressure Injury 01/16/20 Heel Right;Posterior Stage 3 -  Full thickness tissue loss. Subcutaneous fat may be visible but bone, tendon or muscle are NOT exposed. 770 days   Pressure Injury 01/16/20 Sacrum Medial;Posterior Stage 4 - Full thickness tissue loss with exposed bone, tendon or muscle. Large open with granulated tissue present. 770 days   Pressure Injury 10/07/21 Heel Left Unstageable - Full thickness tissue loss in which the base of the injury is covered by slough (yellow, tan, gray, green or brown) and/or eschar (tan, brown or black) in the wound bed. Unstageable wound with black base 141 days   Pressure Injury 02/23/22 Buttocks Bilateral 2 days   Pressure Injury 02/23/22 Heel Left;Posterior 2 days           (Optional):26781}  Pain control - [place] Controlled Substance Reporting System database was reviewed. and patient was instructed, not to drive, operate heavy machinery, perform activities at heights, swimming or participation in water activities or provide baby-sitting services while on Pain, Sleep and Anxiety Medications; until their outpatient Physician has advised to do so again. Also recommended to not to take more than prescribed Pain, Sleep and Anxiety Medications.   Disposition: Home health Diet recommendation:  Discharge Diet Orders (From admission, onward)     Start     Ordered   02/25/22 0000  Diet - low sodium heart healthy        02/25/22 1526           Cardiac and Carb modified diet DISCHARGE MEDICATION: Allergies as of 02/25/2022   No Known Allergies      Medication List     TAKE these medications    amLODipine-olmesartan 5-20 MG tablet Commonly known as: AZOR Take 1 tablet by mouth daily.    ascorbic acid 500 MG tablet Commonly known as: VITAMIN C Take 1 tablet (500 mg total) by mouth daily.   ciprofloxacin 500 MG tablet Commonly known as: CIPRO Take 1 tablet (500 mg total) by mouth 2 (two) times daily for 4 days.   cyanocobalamin 1000 MCG tablet Commonly known as: VITAMIN B12 Take 3,000 mcg by mouth daily.   Eliquis 5 MG Tabs tablet Generic drug: apixaban Take 5 mg by mouth 2 (two) times daily.   fish oil-omega-3 fatty acids 1000 MG capsule SMARTSIG:1 Capsule(s) By Mouth Daily   glipiZIDE 2.5 MG 24 hr tablet Commonly known as: GLUCOTROL XL Take 1 tablet (2.5 mg total) by mouth daily with breakfast.   memantine 10 MG tablet Commonly known as: NAMENDA Take 10 mg by mouth 2 (two) times daily.   Oxcarbazepine 300 MG tablet Commonly known as: TRILEPTAL Take 1 tablet (300 mg total) by mouth 2 (two) times daily.   QUEtiapine 50 MG tablet Commonly known as: SEROQUEL TAKE 1/2 TO 1 (ONE-HALF TO ONE) TABLET BY MOUTH ONCE DAILY AS NEEDED FOR  AGITATION   QUEtiapine 100 MG tablet Commonly known as: SEROQUEL Take 1 tablet (100 mg total) by mouth at bedtime.  rosuvastatin 20 MG tablet Commonly known as: CRESTOR Take 20 mg by mouth daily.   vitamin D3 25 MCG tablet Commonly known as: CHOLECALCIFEROL Take 1 tablet (1,000 Units total) by mouth daily.   zinc sulfate 220 (50 Zn) MG capsule Take 1 capsule (220 mg total) by mouth daily.               Discharge Care Instructions  (From admission, onward)           Start     Ordered   02/25/22 0000  Discharge wound care:       Comments: 02/24/22 0742    Wound care  Daily at 5am      Comments: Wound care to left chronic, nonhealing pressure injury (Stage 3): wash with soap and water, rinse and pat dry. Cover with xeroform gauze, top with dry gauze and secure with Kerlix roll gauze/paper tape.  02/24/22 0741   02/24/22 0725    Wound care  Daily at 5am      Comments: Wound care to sacral chronic,  nonhealing wound, Stage 4 and Right heel chronic nonhealing wound, Stage 3:  Wash with soap and water, rinse and dry. Fill/cover with silver hydrofiber dressing (Aquacel Ag+ Advantage, [place] # [id number]), top with dry gauze and secure. Secure heel with Kerlix roll gauze/paper tape, secure sacral wound with ABD pad, 2-inch paper tape or silicone foam.   [id number]            Follow-up Information     [name], MD. Schedule an appointment as soon as possible for a visit in 1 week(s).   Specialty: Internal Medicine Contact information: [place] [address] [phone]         [place]. Go in 3 week(s).   Why: recurrent UTI, As needed        [place]. Go to.   Specialty: Wound Care Why: on your routine appt Contact information: [address] [id number] ar [phone]               Discharge Exam: Danley Danker Weights   02/22/22 1153 02/24/22 0459 02/25/22 0600  Weight: 96.2 kg 120.1 kg 119.9 kg     Condition at discharge: fair  The results of significant diagnostics from this hospitalization (including imaging, microbiology, ancillary and laboratory) are listed below for reference.   Imaging Studies: US RENAL  Result Date: 02/25/2022 CLINICAL DATA:  Hydronephrosis EXAM: RENAL / URINARY TRACT ULTRASOUND COMPLETE COMPARISON:  CT scan of the abdomen and pelvis January 12, 2022 FINDINGS: Right Kidney: Renal measurements: 11.4 x 5.1 by 4.6 cm = volume: 140 mL. Mild hydronephrosis. Left Kidney: Renal measurements: 11.2 x 5.4 x 5.1 cm = volume: 160 mL. Echogenicity within normal limits. No mass or hydronephrosis visualized. Bladder: Decompressed with a Foley catheter. Other: None. IMPRESSION: 1. Mild hydronephrosis on the right. 2. The left kidney is normal. 3. The bladder are a Foley catheter. Electronically Signed   By: Dorise Bullion III M.D.   On: 02/25/2022 13:58   DG  Chest 2 View  Result Date: 02/22/2022 CLINICAL DATA:  Suspected sepsis. EXAM: CHEST - 2 VIEW COMPARISON:  Chest x-ray 01/12/2022. FINDINGS: No consolidation. No visible pleural effusion or pneumothorax. Possible enlargement of the pulmonary arteries. Cardiomediastinal silhouette is otherwise within normal limits. No evidence of acute osseous abnormality. Polyarticular degenerative change. IMPRESSION: 1. No consolidation. 2. Possible enlargement of the pulmonary arteries, as can be seen with pulmonary  arterial hypertension. Electronically Signed   By: Feliberto Harts M.D.   On: 02/22/2022 12:53    Microbiology: Results for orders placed or performed during the hospital encounter of 02/22/22  Culture, blood (Routine x 2)     Status: None (Preliminary result)   Collection Time: 02/22/22 12:03 PM   Specimen: BLOOD  Result Value Ref Range Status   Specimen Description BLOOD RIGHT ANTECUBITAL  Final   Special Requests   Final    BOTTLES DRAWN AEROBIC AND ANAEROBIC Blood Culture adequate volume   Culture   Final    NO GROWTH 3 DAYS Performed at [facility], [address] [place], [place] [address]    Report Status PENDING  Incomplete  Urine Culture     Status: Abnormal   Collection Time: 02/22/22 12:03 PM   Specimen: In/Out Cath Urine  Result Value Ref Range Status   Specimen Description   Final    IN/OUT CATH URINE Performed at [facility], [address], [place], [place] [address]    Special Requests   Final    NONE Performed at [facility], [address] [place], [place] [address]    Culture (A)  Final    >=100,000 COLONIES/mL ACINETOBACTER CALCOACETICUS/BAUMANNII COMPLEX >=100,000 COLONIES/mL STAPHYLOCOCCUS AUREUS    Report Status 02/25/2022 FINAL  Final   Organism ID, Bacteria ACINETOBACTER CALCOACETICUS/BAUMANNII COMPLEX (A)  Final   Organism ID, Bacteria STAPHYLOCOCCUS AUREUS (A)  Final      Susceptibility   Acinetobacter  calcoaceticus/baumannii complex - MIC*    CEFTAZIDIME 4 SENSITIVE Sensitive     CIPROFLOXACIN <=0.25 SENSITIVE Sensitive     GENTAMICIN <=1 SENSITIVE Sensitive     IMIPENEM <=0.25 SENSITIVE Sensitive     PIP/TAZO <=4 SENSITIVE Sensitive     TRIMETH/SULFA <=20 SENSITIVE Sensitive     AMPICILLIN/SULBACTAM <=2 SENSITIVE Sensitive     * >=100,000 COLONIES/mL ACINETOBACTER CALCOACETICUS/BAUMANNII COMPLEX   Staphylococcus aureus - MIC*    CIPROFLOXACIN <=0.5 SENSITIVE Sensitive     GENTAMICIN <=0.5 SENSITIVE Sensitive     NITROFURANTOIN <=16 SENSITIVE Sensitive     OXACILLIN 0.5 SENSITIVE Sensitive     TETRACYCLINE <=1 SENSITIVE Sensitive     VANCOMYCIN 1 SENSITIVE Sensitive     TRIMETH/SULFA <=10 SENSITIVE Sensitive     CLINDAMYCIN <=0.25 SENSITIVE Sensitive     RIFAMPIN <=0.5 SENSITIVE Sensitive     Inducible Clindamycin NEGATIVE Sensitive     * >=100,000 COLONIES/mL STAPHYLOCOCCUS AUREUS  Culture, blood (Routine x 2)     Status: None (Preliminary result)   Collection Time: 02/22/22 12:05 PM   Specimen: BLOOD  Result Value Ref Range Status   Specimen Description BLOOD LEFT ANTECUBITAL  Final   Special Requests   Final    BOTTLES DRAWN AEROBIC AND ANAEROBIC Blood Culture adequate volume   Culture   Final    NO GROWTH 3 DAYS Performed at [facility], [address], [place], [place] [address]    Report Status PENDING  Incomplete  Resp Panel by RT-PCR (Flu A&B, Covid) Anterior Nasal Swab     Status: None   Collection Time: 02/22/22  1:25 PM   Specimen: Anterior Nasal Swab  Result Value Ref Range Status   SARS Coronavirus 2 by RT PCR NEGATIVE NEGATIVE Final    Comment: (NOTE) SARS-CoV-2 target nucleic acids are NOT DETECTED.  The SARS-CoV-2 RNA is generally detectable in upper respiratory specimens during the acute phase of infection. The lowest concentration of SARS-CoV-2 viral copies this  assay can detect is 138 copies/mL. A negative result does not preclude  SARS-Cov-2 infection and should not be used as the sole basis for treatment or other patient management decisions. A negative result may occur with  improper specimen collection/handling, submission of specimen other than nasopharyngeal swab, presence of viral mutation(s) within the areas targeted by this assay, and inadequate number of viral copies(<138 copies/mL). A negative result must be combined with clinical observations, patient history, and epidemiological information. The expected result is Negative.  Fact Sheet for Patients:  EntrepreneurPulse.com.au  Fact Sheet for Healthcare Providers:  IncredibleEmployment.be  This test is no t yet approved or cleared by the [place] FDA and  has been authorized for detection and/or diagnosis of SARS-CoV-2 by FDA under an Emergency Use Authorization (EUA). This EUA will remain  in effect (meaning this test can be used) for the duration of the COVID-19 declaration under Section 564(b)(1) of the Act, 21 U.S.C.section 360bbb-3(b)(1), unless the authorization is terminated  or revoked sooner.       Influenza A by PCR NEGATIVE NEGATIVE Final   Influenza B by PCR NEGATIVE NEGATIVE Final    Comment: (NOTE) The Xpert Xpress SARS-CoV-2/FLU/RSV plus assay is intended as an aid in the diagnosis of influenza from Nasopharyngeal swab specimens and should not be used as a sole basis for treatment. Nasal washings and aspirates are unacceptable for Xpert Xpress SARS-CoV-2/FLU/RSV testing.  Fact Sheet for Patients: EntrepreneurPulse.com.au  Fact Sheet for Healthcare Providers: IncredibleEmployment.be  This test is not yet approved or cleared by the [place] FDA and has been authorized for detection and/or diagnosis of SARS-CoV-2 by FDA under an Emergency Use Authorization (EUA). This EUA will remain in effect (meaning this test can be used) for the duration of  the COVID-19 declaration under Section 564(b)(1) of the Act, 21 U.S.C. section 360bbb-3(b)(1), unless the authorization is terminated or revoked.  Performed at [facility], [address] [place], [place] [address]     Labs: CBC: Recent Labs  Lab 02/22/22 1218 02/23/22 0615  WBC 11.9* 8.7  NEUTROABS 10.2*  --   HGB 9.1* 7.8*  HCT 30.0* 25.6*  MCV 84.5 81.5  PLT 247 [id number]   Basic Metabolic Panel: Recent Labs  Lab 02/22/22 1258 02/22/22 1436 02/22/22 1754 02/23/22 0615 02/25/22 1422  NA 129* 128* 134* 130* 135  K 4.7 4.6 3.4* 4.0 4.2  CL 100 103 111 106 110  CO2 19* 20* 18* 19* 22  GLUCOSE 291* 242* 131* 135* 129*  BUN 25* 24* 21 24* 22  CREATININE 2.09* 2.04* 1.48* 1.98* 1.62*  CALCIUM 8.5* 8.2* 6.5* 8.0* 8.5*   Liver Function Tests: Recent Labs  Lab 02/22/22 1258  AST 20  ALT 14  ALKPHOS 92  BILITOT 0.6  PROT 8.5*  ALBUMIN 2.9*   CBG: Recent Labs  Lab 02/24/22 1232 02/24/22 1637 02/24/22 2051 02/25/22 0804 02/25/22 1140  GLUCAP 149* 151* 175* 128* 148*    Discharge time spent: {LESS THAN/GREATER THAN:26388} 30 minutes.  Signed: Fritzi Mandes, MD [facility] 02/25/2022

## 2022-02-25 NOTE — Consult Note (Signed)
NAME: [name]  DOB: May 30, 1956  MRN: [id number]  Date/Time: 02/25/2022 12:53 PM  REQUESTING PROVIDER: Dr.Patel Subjective:  REASON FOR CONSULT: UTI ? [name] is a 66 y.o. 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 E.coli bacteremia in July 2023, frequent hospitalizations presents from home on 02/22/22 with fever. Had foley exchange on 02/13/22   02/22/22  BP 153/76 !  Temp 99 F (37.2 C)  Pulse Rate 79  Resp 17  SpO2 100 %    Latest Reference Range & Units 02/22/22  WBC 4.0 - 10.5 K/uL 11.9 (H)  Hemoglobin 13.0 - 17.0 g/dL 9.1 (L)  HCT 38.1 - 82.9 % 30.0 (L)  Platelets 150 - 400 K/uL 247  Creatinine 0.61 - 1.24 mg/dL 9.37 (H)   As per wife he had blood in the colostomy bag for a few days and it resolved before admission Past Medical History:  Diagnosis Date   Bipolar disorder (HCC)    Diabetes mellitus without complication (HCC)    History of blood clots    Hypertension    TBI (traumatic brain injury) (HCC)     Past Surgical History:  Procedure Laterality Date   BACK SURGERY     CARPAL TUNNEL RELEASE Bilateral    COLON SURGERY     COLOSTOMY     IR PERC TUN PERIT CATH WO PORT S&I /IMAG  10/11/2021   IR REMOVAL TUN CV CATH W/O FL  11/19/2021   IR US GUIDE VASC ACCESS RIGHT  10/11/2021   TONSILLECTOMY      Social History   Socioeconomic History   Marital status: Married    Spouse name: thelma   Number of children: 0   Years of education: Not on file   Highest education level: Associate degree: occupational, Scientist, product/process development, or vocational program  Occupational History   Not on file  Tobacco Use   Smoking status: Never    Passive exposure: Never   Smokeless tobacco: Never  Vaping Use   Vaping Use: Never used  Substance and Sexual Activity    Alcohol use: Yes   Drug use: No   Sexual activity: Not on file  Other Topics Concern   Not on file  Social History Narrative   Not on file   Social Determinants of Health   Financial Resource Strain: Low Risk  (09/09/2017)   Overall Financial Resource Strain (CARDIA)    Difficulty of Paying Living Expenses: Not hard at all  Food Insecurity: No Food Insecurity (09/09/2017)   Hunger Vital Sign    Worried About Running Out of Food in the Last Year: Never true    Ran Out of Food in the Last Year: Never true  Transportation Needs: No Transportation Needs (09/09/2017)   PRAPARE - Administrator, Civil Service (Medical): No    Lack of Transportation (Non-Medical): No  Physical Activity: Inactive (09/09/2017)   Exercise Vital Sign    Days of Exercise per Week: 0 days    Minutes of Exercise per Session: 0 min  Stress: No Stress Concern Present (09/09/2017)   [employer] of Occupational Health - Occupational Stress Questionnaire    Feeling of Stress : Not at all  Social Connections: Unknown (09/09/2017)   Social Connection and Isolation Panel [NHANES]    Frequency of Communication with Friends and Family: Not on  file    Frequency of Social Gatherings with Friends and Family: Not on file    Attends Religious Services: More than 4 times per year    Active Member of [employer] or Organizations: No    Attends Banker Meetings: Never    Marital Status: Married  Catering manager Violence: Not At Risk (09/09/2017)   Humiliation, Afraid, Rape, and Kick questionnaire    Fear of Current or Ex-Partner: No    Emotionally Abused: No    Physically Abused: No    Sexually Abused: No    Family History  Problem Relation Age of Onset   Depression Father    Deep vein thrombosis Father    No Known Allergies I? Current Facility-Administered Medications  Medication Dose Route Frequency Provider Last Rate Last Admin   acetaminophen (TYLENOL) tablet 650 mg  650 mg Oral Q6H PRN Lorretta Harp, MD   650 mg at 02/25/22 1020   apixaban (ELIQUIS) tablet 5 mg  5 mg Oral BID Lorretta Harp, MD   5 mg at 02/25/22 1022   ascorbic acid (VITAMIN C) tablet 500 mg  500 mg Oral Daily Lorretta Harp, MD   500 mg at 02/25/22 1024   Chlorhexidine Gluconate Cloth 2 % PADS 6 each  6 each Topical Daily Enedina Finner, MD   6 each at 02/25/22 1025   cholecalciferol (VITAMIN D3) 25 MCG (1000 UNIT) tablet 1,000 Units  1,000 Units Oral Daily Lorretta Harp, MD   1,000 Units at 02/25/22 1018   cyanocobalamin (VITAMIN B12) tablet 3,000 mcg  3,000 mcg Oral Daily Lorretta Harp, MD   3,000 mcg at 02/25/22 1020   hydrALAZINE (APRESOLINE) injection 5 mg  5 mg Intravenous Q2H PRN Lorretta Harp, MD       insulin aspart (novoLOG) injection 0-5 Units  0-5 Units Subcutaneous QHS Lorretta Harp, MD       insulin aspart (novoLOG) injection 0-9 Units  0-9 Units Subcutaneous TID WC Lorretta Harp, MD   1 Units at 02/25/22 0900   memantine (NAMENDA) tablet 10 mg  10 mg Oral BID Lorretta Harp, MD   10 mg at 02/25/22 1022   meropenem (MERREM) 1 g in sodium chloride 0.9 % 100 mL IVPB  1 g Intravenous Q12H Enedina Finner, MD 200 mL/hr at 02/25/22 1030 1 g at 02/25/22 1030   omega-3 acid ethyl esters (LOVAZA) capsule 1 g  1 capsule Oral Daily Lorretta Harp, MD   1 g at 02/25/22 1018   ondansetron (ZOFRAN) injection 4 mg  4 mg Intravenous Q8H PRN Lorretta Harp, MD       Oxcarbazepine (TRILEPTAL) tablet 300 mg  300 mg Oral BID Lorretta Harp, MD   300 mg at 02/25/22 1019   QUEtiapine (SEROQUEL) tablet 100 mg  100 mg Oral QHS Lorretta Harp, MD   100 mg at 02/24/22 2350   QUEtiapine (SEROQUEL) tablet 50 mg  50 mg Oral BID PRN Lorretta Harp, MD       rosuvastatin (CRESTOR) tablet 20 mg  20 mg Oral Daily Lorretta Harp, MD   20 mg at 02/25/22 1021     Abtx:  Anti-infectives (From admission, onward)    Start     Dose/Rate Route Frequency Ordered Stop   02/23/22 1500  vancomycin (VANCOREADY) IVPB 750 mg/150 mL  Status:  Discontinued        750 mg 150 mL/hr over 60 Minutes  Intravenous Every 24 hours 02/22/22 1541 02/23/22 0800   02/23/22 1000  meropenem (MERREM) 1  g in sodium chloride 0.9 % 100 mL IVPB        1 g 200 mL/hr over 30 Minutes Intravenous Every 12 hours 02/23/22 0807     02/23/22 0100  ceFEPIme (MAXIPIME) 2 g in sodium chloride 0.9 % 100 mL IVPB  Status:  Discontinued        2 g 200 mL/hr over 30 Minutes Intravenous Every 12 hours 02/22/22 1609 02/23/22 0806   02/22/22 2200  metroNIDAZOLE (FLAGYL) IVPB 500 mg  Status:  Discontinued        500 mg 100 mL/hr over 60 Minutes Intravenous Every 12 hours 02/22/22 1528 02/23/22 0800   02/22/22 1230  vancomycin (VANCOREADY) IVPB 2000 mg/400 mL        2,000 mg 200 mL/hr over 120 Minutes Intravenous  Once 02/22/22 1223 02/22/22 1707   02/22/22 1215  ceFEPIme (MAXIPIME) 2 g in sodium chloride 0.9 % 100 mL IVPB        2 g 200 mL/hr over 30 Minutes Intravenous  Once 02/22/22 1203 02/22/22 1327   02/22/22 1215  metroNIDAZOLE (FLAGYL) IVPB 500 mg        500 mg 100 mL/hr over 60 Minutes Intravenous  Once 02/22/22 1203 02/22/22 1453   02/22/22 1215  vancomycin (VANCOCIN) IVPB 1000 mg/200 mL premix  Status:  Discontinued        1,000 mg 200 mL/hr over 60 Minutes Intravenous  Once 02/22/22 1203 02/22/22 1223       REVIEW OF SYSTEMS:  Const:  fever, negative chills, negative weight loss Eyes: negative diplopia or visual changes, negative eye pain ENT: negative coryza, negative sore throat Resp: negative cough, hemoptysis, dyspnea Cards: negative for chest pain, palpitations, lower extremity edema GU: has chronic foley- no hematuria GI: Negative for abdominal pain, diarrhea, bleeding, has constipation Skin: negative for rash and pruritus Heme: negative for easy bruising and gum/nose bleeding MS: weakness Neurolo:negative for headaches, dizziness, vertigo, memory problems  Psych: anxiety,  Endocrine: , diabetes Allergy/Immunology- negative for any medication or food allergies  Objective:  VITALS:  BP  (!) 174/99 (BP Location: Right Arm)   Pulse 72   Temp 97.9 F (36.6 C) (Oral)   Resp 18   Ht 5\' 6"  (1.676 m)   Wt 119.9 kg   SpO2 100%   BMI 42.66 kg/m  LDA Foley  PHYSICAL EXAM:  General: Alert, cooperative, no distress, appears stated age.  Head: Normocephalic, without obvious abnormality, atraumatic. Eyes: Conjunctivae clear, anicteric sclerae. Pupils are equal ENT Nares normal. No drainage or sinus tenderness. Lips, mucosa, and tongue normal. No Thrush Neck:  symmetrical, no adenopathy, thyroid: non tender no carotid bruit and no JVD. Back: not examined. Lungs: b/l air entry. Heart: s1s2 Abdomen: Soft, non-tender,not distended. colostomy Extremities: b/l heel wounds- does not look infected     Skin: No rashes or lesions. Or bruising Lymph: Cervical, supraclavicular normal. Neurologic: paraparesis Pertinent Labs Lab Results CBC    Component Value Date/Time   WBC 8.7 02/23/2022 0615   RBC 3.14 (L) 02/23/2022 0615   HGB 7.8 (L) 02/23/2022 0615   HCT 25.6 (L) 02/23/2022 0615   PLT 209 02/23/2022 0615   MCV 81.5 02/23/2022 0615   MCH 24.8 (L) 02/23/2022 0615   MCHC 30.5 02/23/2022 0615   RDW 16.7 (H) 02/23/2022 0615   LYMPHSABS 1.0 02/22/2022 1218   MONOABS 0.7 02/22/2022 1218   EOSABS 0.1 02/22/2022 1218   BASOSABS 0.0 02/22/2022 1218       Latest Ref Rng &  Units 02/23/2022    6:15 AM 02/22/2022    5:54 PM 02/22/2022    2:36 PM  CMP  Glucose 70 - 99 mg/dL 809  983  382   BUN 8 - 23 mg/dL 24  21  24    Creatinine 0.61 - 1.24 mg/dL  5.05  3.97   Sodium 135 - 145 mmol/L 130  134  128   Potassium 3.5 - 5.1 mmol/L 4.0  3.4  4.6   Chloride 98 - 111 mmol/L 106  111  103   CO2 22 - 32 mmol/L 19  18  20    Calcium 8.9 - 10.3 mg/dL 8.0  6.5  8.2       Microbiology: Recent Results (from the past 240 hour(s))  Aerobic Culture w Gram Stain (superficial specimen)     Status: None (Preliminary result)   Collection Time: 02/22/22 11:00 AM   Specimen: Wound   Result Value Ref Range Status   Specimen Description WOUND  Final   Special Requests HEEL  Final   Gram Stain   Final    NO WBC SEEN FEW GRAM NEGATIVE RODS FEW GRAM POSITIVE COCCI IN PAIRS    Culture   Final    ABUNDANT PROTEUS MIRABILIS MODERATE GROUP B STREP(S.AGALACTIAE)ISOLATED TESTING AGAINST S. AGALACTIAE NOT ROUTINELY PERFORMED DUE TO PREDICTABILITY OF AMP/PEN/VAN SUSCEPTIBILITY. ABUNDANT PSEUDOMONAS AERUGINOSA SUSCEPTIBILITIES TO FOLLOW Performed at [facility], [address], [place], [address] [place]    Report Status PENDING  Incomplete  Culture, blood (Routine x 2)     Status: None (Preliminary result)   Collection Time: 02/22/22 12:03 PM   Specimen: BLOOD  Result Value Ref Range Status   Specimen Description BLOOD RIGHT ANTECUBITAL  Final   Special Requests   Final    BOTTLES DRAWN AEROBIC AND ANAEROBIC Blood Culture adequate volume   Culture   Final    NO GROWTH 3 DAYS Performed at [facility], [address], [place], [address] [place]    Report Status PENDING  Incomplete  Urine Culture     Status: Abnormal   Collection Time: 02/22/22 12:03 PM   Specimen: In/Out Cath Urine  Result Value Ref Range Status   Specimen Description   Final    IN/OUT CATH URINE Performed at [facility], [address], [place], [address] [place]    Special Requests   Final    NONE Performed at [facility], [address] [place], [address] [place]    Culture (A)  Final    >=100,000 COLONIES/mL ACINETOBACTER CALCOACETICUS/BAUMANNII COMPLEX >=100,000 COLONIES/mL STAPHYLOCOCCUS AUREUS    Report Status 02/25/2022 FINAL  Final   Organism ID, Bacteria ACINETOBACTER CALCOACETICUS/BAUMANNII COMPLEX (A)  Final   Organism ID, Bacteria STAPHYLOCOCCUS AUREUS (A)  Final      Susceptibility   Acinetobacter calcoaceticus/baumannii complex - MIC*    CEFTAZIDIME 4 SENSITIVE Sensitive     CIPROFLOXACIN <=0.25 SENSITIVE Sensitive     GENTAMICIN <=1  SENSITIVE Sensitive     IMIPENEM <=0.25 SENSITIVE Sensitive     PIP/TAZO <=4 SENSITIVE Sensitive     TRIMETH/SULFA <=20 SENSITIVE Sensitive     AMPICILLIN/SULBACTAM <=2 SENSITIVE Sensitive     * >=100,000 COLONIES/mL ACINETOBACTER CALCOACETICUS/BAUMANNII COMPLEX   Staphylococcus aureus - MIC*    CIPROFLOXACIN <=0.5 SENSITIVE Sensitive     GENTAMICIN <=0.5 SENSITIVE Sensitive     NITROFURANTOIN <=16 SENSITIVE Sensitive     OXACILLIN 0.5 SENSITIVE Sensitive     TETRACYCLINE <=1 SENSITIVE Sensitive  VANCOMYCIN 1 SENSITIVE Sensitive     TRIMETH/SULFA <=10 SENSITIVE Sensitive     CLINDAMYCIN <=0.25 SENSITIVE Sensitive     RIFAMPIN <=0.5 SENSITIVE Sensitive     Inducible Clindamycin NEGATIVE Sensitive     * >=100,000 COLONIES/mL STAPHYLOCOCCUS AUREUS  Culture, blood (Routine x 2)     Status: None (Preliminary result)   Collection Time: 02/22/22 12:05 PM   Specimen: BLOOD  Result Value Ref Range Status   Specimen Description BLOOD LEFT ANTECUBITAL  Final   Special Requests   Final    BOTTLES DRAWN AEROBIC AND ANAEROBIC Blood Culture adequate volume   Culture   Final    NO GROWTH 3 DAYS Performed at [facility], [address], [place], [place] [address]    Report Status PENDING  Incomplete  Resp Panel by RT-PCR (Flu A&B, Covid) Anterior Nasal Swab     Status: None   Collection Time: 02/22/22  1:25 PM   Specimen: Anterior Nasal Swab  Result Value Ref Range Status   SARS Coronavirus 2 by RT PCR NEGATIVE NEGATIVE Final    Comment: (NOTE) SARS-CoV-2 target nucleic acids are NOT DETECTED.  The SARS-CoV-2 RNA is generally detectable in upper respiratory specimens during the acute phase of infection. The lowest concentration of SARS-CoV-2 viral copies this assay can detect is 138 copies/mL. A negative result does not preclude SARS-Cov-2 infection and should not be used as the sole basis for treatment or other patient management decisions. A negative result may occur with   improper specimen collection/handling, submission of specimen other than nasopharyngeal swab, presence of viral mutation(s) within the areas targeted by this assay, and inadequate number of viral copies(<138 copies/mL). A negative result must be combined with clinical observations, patient history, and epidemiological information. The expected result is Negative.  Fact Sheet for Patients:  BloggerCourse.com  Fact Sheet for Healthcare Providers:  SeriousBroker.it  This test is no t yet approved or cleared by the [place] FDA and  has been authorized for detection and/or diagnosis of SARS-CoV-2 by FDA under an Emergency Use Authorization (EUA). This EUA will remain  in effect (meaning this test can be used) for the duration of the COVID-19 declaration under Section 564(b)(1) of the Act, 21 U.S.C.section 360bbb-3(b)(1), unless the authorization is terminated  or revoked sooner.       Influenza A by PCR NEGATIVE NEGATIVE Final   Influenza B by PCR NEGATIVE NEGATIVE Final    Comment: (NOTE) The Xpert Xpress SARS-CoV-2/FLU/RSV plus assay is intended as an aid in the diagnosis of influenza from Nasopharyngeal swab specimens and should not be used as a sole basis for treatment. Nasal washings and aspirates are unacceptable for Xpert Xpress SARS-CoV-2/FLU/RSV testing.  Fact Sheet for Patients: BloggerCourse.com  Fact Sheet for Healthcare Providers: SeriousBroker.it  This test is not yet approved or cleared by the [place] FDA and has been authorized for detection and/or diagnosis of SARS-CoV-2 by FDA under an Emergency Use Authorization (EUA). This EUA will remain in effect (meaning this test can be used) for the duration of the COVID-19 declaration under Section 564(b)(1) of the Act, 21 U.S.C. section 360bbb-3(b)(1), unless the authorization is terminated  or revoked.  Performed at [facility], [address] [place], [place] [address]     IMAGING RESULTS: CXR no infiltrate I have personally reviewed the films ? Impression/Recommendation ? ?Fever secondary to complicated UTI Has indwelling foley for neurogenic bladder High risk for colonization Not always it is infection Ths time because of  fever and no other source identified will treat like UTI- MSSA/acinetobacter in urine received 3 days of antibiotic will do for 4 more days- cipro or bactrim US kidneys done- mild rt hydronephrosis  Sacral decubitus B/l heel decubitus - not overtly infected- followed at wound clinic  H/o paraparesis   colostomy? ___________________________________________________ Discussed with patient, wife and requesting provider Note:  This document was prepared using Dragon voice recognition software and may include unintentional dictation errors.

## 2022-02-25 NOTE — Care Management Important Message (Signed)
Important Message  Patient Details  Name: [name] MRN: [id number] Date of Birth: Nov 23, 1955   Medicare Important Message Given:  N/A - LOS <3 / Initial given by admissions     [name] 02/25/2022, 4:07 PM

## 2022-02-25 NOTE — Progress Notes (Signed)
[facility]  - [place] [facility]   PATIENT NAME: [name]    MR#:  [id number]  DATE OF BIRTH:  08-26-1955  SUBJECTIVE:   Patient seen earlier. Came in from wound center with fever. No family at bedside. Eating earlier. No fever  Overall feels better.no pain in the thighs, no redness Has chronic foley   VITALS:  Blood pressure (!) 174/99, pulse 72, temperature 97.9 F (36.6 C), temperature source Oral, resp. rate 18, height 5\' 6"  (1.676 m), weight 119.9 kg, SpO2 100 %.  PHYSICAL EXAMINATION:   GENERAL:  66 y.o.-year-old patient lying in the bed with no acute distress. Obese chronic LUNGS: Normal breath sounds bilaterally, no wheezing, rales, rhonchi.  CARDIOVASCULAR: S1, S2 normal. No murmurs, rubs, or gallops.  ABDOMEN: Soft, nontender, nondistended. Bowel sounds present.  EXTREMITIES: chronic right lower extremity edema    NEUROLOGIC: nonfocal  patient is alert and awake SKIN:Right foot Left foot   LABORATORY PANEL:  CBC Recent Labs  Lab 02/23/22 0615  WBC 8.7  HGB 7.8*  HCT 25.6*  PLT 209     Chemistries  Recent Labs  Lab 02/22/22 1258 02/22/22 1436 02/23/22 0615  NA 129*   < > 130*  K 4.7   < > 4.0  CL 100   < > 106  CO2 19*   < > 19*  GLUCOSE 291*   < > 135*  BUN 25*   < > 24*  CREATININE 2.09*   < > 1.98*  CALCIUM 8.5*   < > 8.0*  AST 20  --   --   ALT 14  --   --   ALKPHOS 92  --   --   BILITOT 0.6  --   --    < > = values in this interval not displayed.     RADIOLOGY:  No results found.  Assessment and Plan  [name] is a 66 y.o. 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   Patient states that he has fever and chills for more than 2  days.  Temperature was 101 at home today.  Patient has generalized weakness.  He also reports pain and mild erythema in left inner thigh.   Severe sepsis (HCC) recurrent UTI with indwelling Foley catheter Severe sepsis due to complicated UTI and possible left inner thigh cellulitis: Patient meets criteria for severe sepsis with fever 101.7, heart rate up to 121, RR 28.  Lactic acid 3.5.  Patient has mild leukocytosis to 11.9.   -Antibiotics: Vancomycin, cefepime and Flagyl-- de-escalate to imipenem  --urine culture --,ACINETOBACTER CALCOACETICUS/BAUMANNII COMPLEX and staph aureus -Follow-up blood culture so far negative and urine culture pending - lactic acid level 1.2 - procalcitonin level .3 -- sepsis improving  -- given recurrent infections will get ID input regarding abx  Cellulitis of left thigh chronic right heel pressure ulcer chronic left heel pressure ulcer Patient has tenderness, erythema and warmth in left upper thigh, indicating possible cellulitis. -see above -- appears to be stable today. Do not feel any redness on the thighs -- Wound consult for bilateral chronic healing wounds-- appreciate input   Complicated UTI (urinary tract infection) due to chronic Foley catheter -see above -- patient follows with [facility] urology. His catheter was changed on 15 February 2022  Type II diabetes mellitus with renal manifestations (HCC) Recent A1c 6.8, well controlled.  Patient is taking glipizide at home -Sliding scale insulin   Essential hypertension -Hold amlodipine- olmesartan due to severe sepsis and high risk of developing hypotension -IV hydralazine as needed   Chronic indwelling Foley catheter - Last  Change was 02/15/22   Obesity with body mass index of 30.0-39.9   BMI=34.22   and BW= 96.2 -Diet and exercise.   -Encourage to lose weight.   Chronic diastolic CHF (congestive heart failure) (HCC) 2D echo on 10/07/2021 showed EF 60-65% with grade 3 diastolic dysfunction.   Patient does have leg edema.  No JVD.  BNP 37.8.  CHF is compensated. -Watch volume status closely   Acute renal failure superimposed on stage 3b chronic kidney disease (HCC) Likely due to UTI -Follow-up with BMP -Treat UTI as above   TBI (traumatic brain injury) (HCC) - Has cognitive impairment -Continue Namenda   Hyponatremia Sodium 129.  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  >50% time spent on counselling and coordination of care  Note: This dictation was prepared with Dragon dictation along with smaller phrase technology. Any transcriptional errors that result from this process are unintentional.  Enedina Finner M.D    [facility]   CC: Primary care physician; Dione Booze, MD

## 2022-02-25 NOTE — TOC Transition Note (Signed)
Transition of Care [facility]) - CM/SW Discharge Note   Patient Details  Name: Verbon Giangregorio MRN: [id number] Date of Birth: 03-23-1956  [facility]) CM/SW Contact:  Margarito Liner, LCSW Phone Number: 02/25/2022, 3:34 PM   Clinical Narrative:   Patient has orders to discharge home today. [facility] representative is aware. No further concerns. CSW signing off.  Final next level of care: Home w Home Health Services Barriers to Discharge: Barriers Resolved   Patient Goals and CMS Choice Patient states their goals for this hospitalization and ongoing recovery are:: to get better and go home CMS Medicare.gov Compare Post Acute Care list provided to:: Patient Choice offered to / list presented to : NA  Discharge Placement                Patient to be transferred to facility by: Wife   Patient and family notified of of transfer: 02/25/22  Discharge Plan and Services   Discharge Planning Services: CM Consult Post Acute Care Choice: Resumption of Svcs/PTA Provider, Home Health          DME Arranged: N/A DME Agency: NA       HH Arranged: RN, PT, OT HH Agency: [facility] Date HH Agency Contacted: 02/25/22 Time HH Agency Contacted: 1547 Representative spoke with at [facility] Agency: Becky Sax  Social Determinants of Health (SDOH) Interventions     Readmission Risk Interventions    02/23/2022    3:44 PM 01/14/2022    3:23 PM 01/07/2022   10:14 AM  Readmission Risk Prevention Plan  Transportation Screening Complete Complete Complete  Medication Review [employer])  Complete Complete  PCP or Specialist appointment within 3-5 days of discharge Complete  Complete  HRI or Home Care Consult Complete Complete Complete  SW Recovery Care/Counseling Consult Complete  Complete  Palliative Care Screening Not Applicable Not Applicable Not Applicable  Skilled Nursing Facility Not Applicable  Patient Refused

## 2022-02-26 LAB — AEROBIC CULTURE W GRAM STAIN (SUPERFICIAL SPECIMEN): Gram Stain: NONE SEEN

## 2022-02-27 ENCOUNTER — Encounter: Payer: Self-pay | Admitting: Psychiatry

## 2022-02-27 ENCOUNTER — Ambulatory Visit (INDEPENDENT_AMBULATORY_CARE_PROVIDER_SITE_OTHER): Payer: Medicare Other | Admitting: Psychiatry

## 2022-02-27 VITALS — BP 166/98 | HR 89 | Temp 97.8°F

## 2022-02-27 DIAGNOSIS — F0390 Unspecified dementia without behavioral disturbance: Secondary | ICD-10-CM

## 2022-02-27 DIAGNOSIS — F3178 Bipolar disorder, in full remission, most recent episode mixed: Secondary | ICD-10-CM

## 2022-02-27 DIAGNOSIS — F411 Generalized anxiety disorder: Secondary | ICD-10-CM | POA: Diagnosis not present

## 2022-02-27 DIAGNOSIS — G4701 Insomnia due to medical condition: Secondary | ICD-10-CM | POA: Diagnosis not present

## 2022-02-27 LAB — CULTURE, BLOOD (ROUTINE X 2)
Culture: NO GROWTH
Culture: NO GROWTH
Special Requests: ADEQUATE
Special Requests: ADEQUATE

## 2022-02-27 MED ORDER — QUETIAPINE FUMARATE 100 MG PO TABS: 100.0000 mg | ORAL_TABLET | Freq: Every day | ORAL | 0 refills | Status: DC

## 2022-02-27 MED ORDER — QUETIAPINE FUMARATE 50 MG PO TABS: ORAL_TABLET | ORAL | 0 refills | Status: DC

## 2022-02-27 MED ORDER — OXCARBAZEPINE 300 MG PO TABS: 300.0000 mg | ORAL_TABLET | Freq: Two times a day (BID) | ORAL | 0 refills | Status: DC

## 2022-02-27 NOTE — Progress Notes (Unsigned)
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  Patient as well as wife participated in the evaluation.  Wife provided collateral information.  Patient was admitted for UTI, severe sepsis, reviewed notes per Dr. Posey [facility] hospitalist-patient was admitted 02/22/2022, discharged 02/25/2022.  Patient today appeared to be alert, oriented to person place time situation was able to answer questions appropriately.  Patient however wheelchair-bound.  Gets anxious when he tries to get out of the wheelchair and tries to walk.  Per wife, patient does have physical therapy, they come in once a week.  He does have a walker at home.  Patient has to start working on walking and exercising again.  Patient reports he does get anxious when he is left alone although he has been coping okay.  Current medications does help.  He also watches TV as well as talks to his friends on the phone and that helps to.  Patient has a nurse coming in once a week per wife.  Wife reports he does need help taking a shower, getting out of the chair, although he does try to do a lot of things himself although with some assistance.  His appetite seems to be good.  He has been sleeping better.  Patient  denies any suicidality, homicidality or perceptual disturbances.  Denies any other concerns today.  Visit Diagnosis:    ICD-10-CM   1. Bipolar disorder, in full remission, most recent episode mixed (HCC)  F31.78 QUEtiapine (SEROQUEL) 50 MG tablet    QUEtiapine (SEROQUEL) 100 MG tablet    Oxcarbazepine (TRILEPTAL) 300 MG tablet    2. GAD (generalized anxiety disorder)  F41.1 QUEtiapine (SEROQUEL) 50 MG tablet    3. Insomnia due to medical condition  G47.01 QUEtiapine (SEROQUEL) 50 MG tablet   mood, UTI    4. 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  Past Medical History:  Past Medical History:  Diagnosis Date   Bipolar disorder ([place])    Diabetes mellitus without complication ([place])    History of blood clots    Hypertension    TBI (traumatic brain injury) ([place])     Past Surgical History:  Procedure Laterality Date   BACK SURGERY     CARPAL TUNNEL RELEASE Bilateral    COLON SURGERY     COLOSTOMY     IR PERC TUN PERIT CATH WO PORT S&I /IMAG  10/11/2021   IR REMOVAL TUN CV CATH W/O FL  11/19/2021   IR US GUIDE VASC ACCESS RIGHT  10/11/2021   TONSILLECTOMY      Family Psychiatric History: Reviewed family psychiatric history from progress note on 09/09/2017.  Family History:  Family History  Problem Relation Age of Onset   Depression Father    Deep vein thrombosis Father  Social History: Reviewed social history from progress note on 09/09/2017. Social History   Socioeconomic History   Marital status: Married    Spouse name: [name]   Number of children: 0   Years of education: Not on file   Highest education level: Associate degree: occupational, Scientist, product/process development, or vocational program  Occupational History   Not on file  Tobacco Use   Smoking status: Never    Passive exposure: Never   Smokeless tobacco: Never  Vaping  Use   Vaping Use: Never used  Substance and Sexual Activity   Alcohol use: Yes   Drug use: No   Sexual activity: Not on file  Other Topics Concern   Not on file  Social History Narrative   Not on file   Social Determinants of Health   Financial Resource Strain: Low Risk  (09/09/2017)   Overall Financial Resource Strain (CARDIA)    Difficulty of Paying Living Expenses: Not hard at all  Food Insecurity: No Food Insecurity (09/09/2017)   Hunger Vital Sign    Worried About Running Out of Food in the Last Year: Never true    Ran Out of Food in the Last Year: Never true  Transportation Needs: No Transportation Needs (09/09/2017)   PRAPARE - Administrator, Civil Service (Medical): No    Lack of Transportation (Non-Medical): No  Physical Activity: Inactive (09/09/2017)   Exercise Vital Sign    Days of Exercise per Week: 0 days    Minutes of Exercise per Session: 0 min  Stress: No Stress Concern Present (09/09/2017)   [employer] of Occupational Health - Occupational Stress Questionnaire    Feeling of Stress : Not at all  Social Connections: Unknown (09/09/2017)   Social Connection and Isolation Panel [NHANES]    Frequency of Communication with Friends and Family: Not on file    Frequency of Social Gatherings with Friends and Family: Not on file    Attends Religious Services: More than 4 times per year    Active Member of Clubs or Organizations: No    Attends Engineer, structural: Never    Marital Status: Married    Allergies: No Known Allergies  Metabolic Disorder Labs: Lab Results  Component Value Date   HGBA1C 6.8 (H) 01/05/2022   MPG 148.46 01/05/2022   MPG 163 06/22/2021   No results found for: "PROLACTIN" Lab Results  Component Value Date   CHOL 141 04/14/2021   TRIG 45 04/14/2021   HDL 44 04/14/2021   CHOLHDL 3.2 04/14/2021   VLDL 9 04/14/2021   LDLCALC 88 04/14/2021   Lab Results  Component Value Date   TSH 2.462 07/29/2021   TSH  2.175 04/14/2021    Therapeutic Level Labs: Lab Results  Component Value Date   LITHIUM 1.24 (H) 01/04/2016   LITHIUM 1.51 (HH) 01/03/2016   No results found for: "VALPROATE" No results found for: "CBMZ"  Current Medications: Current Outpatient Medications  Medication Sig Dispense Refill   amLODipine-olmesartan (AZOR) 5-20 MG tablet Take 1 tablet by mouth daily.     cholecalciferol (VITAMIN D) 25 MCG tablet Take 1 tablet (1,000 Units total) by mouth daily. 30 tablet 0   ciprofloxacin (CIPRO) 500 MG tablet Take 1 tablet (500 mg total) by mouth 2 (two) times daily for 4 days. 8 tablet 0   ELIQUIS 5 MG TABS tablet Take 5 mg by mouth 2 (two) times daily.      fish oil-omega-3 fatty acids 1000 MG capsule SMARTSIG:1 Capsule(s) By  Mouth Daily     glipiZIDE (GLUCOTROL XL) 2.5 MG 24 hr tablet Take 1 tablet (2.5 mg total) by mouth daily with breakfast. 30 tablet 0   memantine (NAMENDA) 10 MG tablet Take 10 mg by mouth 2 (two) times daily.     rosuvastatin (CRESTOR) 20 MG tablet Take 20 mg by mouth daily.      vitamin B-12 (CYANOCOBALAMIN) 1000 MCG tablet Take 3,000 mcg by mouth daily.     ascorbic acid (VITAMIN C) 500 MG tablet Take 1 tablet (500 mg total) by mouth daily. (Patient not taking: Reported on 02/27/2022) 30 tablet 0   Oxcarbazepine (TRILEPTAL) 300 MG tablet Take 1 tablet (300 mg total) by mouth 2 (two) times daily. 180 tablet 0   QUEtiapine (SEROQUEL) 100 MG tablet Take 1 tablet (100 mg total) by mouth at bedtime. 90 tablet 0   QUEtiapine (SEROQUEL) 50 MG tablet TAKE 1/2 TO 1 (ONE-HALF TO ONE) TABLET BY MOUTH ONCE DAILY AS NEEDED FOR  AGITATION 90 tablet 0   zinc sulfate 220 (50 Zn) MG capsule Take 1 capsule (220 mg total) by mouth daily. (Patient not taking: Reported on 02/27/2022) 30 capsule 0   No current facility-administered medications for this visit.     Musculoskeletal: Strength & Muscle Tone:  UTA Gait & Station:  Wheel chair bound Patient leans: N/A  Psychiatric  Specialty Exam: Review of Systems  Constitutional:  Positive for fatigue.  Psychiatric/Behavioral:  The patient is nervous/anxious.   All other systems reviewed and are negative.   Blood pressure (!) 166/98, pulse 89, temperature 97.8 F (36.6 C), temperature source Temporal.There is no height or weight on file to calculate BMI.  General Appearance: Casual  Eye Contact:  Fair  Speech:  Normal Rate  Volume:  Decreased  Mood:  Anxious  Affect:  Full Range  Thought Process:  Goal Directed and Descriptions of Associations: Intact  Orientation:  Full (Time, Place, and Person)  Thought Content: Logical   Suicidal Thoughts:  No  Homicidal Thoughts:  No  Memory:  Immediate;   Fair Recent;   Fair Remote;   Limited  Judgement:  Fair  Insight:  Fair  Psychomotor Activity:  Normal  Concentration:  Concentration: Fair and Attention Span: Fair  Recall:  [employer] of Knowledge: Fair  Language: Fair  Akathisia:  No  Handed:  Right  AIMS (if indicated): done  Assets:  Communication Skills Desire for [place] Talents/Skills Transportation  ADL's:  Intact  Cognition: baseline  Sleep:  Fair   Screenings: Administrator, Civil Service Office Visit from 02/27/2022 in [facility] Office Visit from 03/10/2018 in [facility] Total Score 0 0      GAD-7    [place] Visit from 02/27/2022 in [facility]  Total GAD-7 Score 2      PHQ2-9    [place] Visit from 02/27/2022 in [facility] Video Visit from 11/08/2021 in [facility] Office Visit from 08/30/2021 in [facility] Video Visit from 12/05/2020 in [facility] Office Visit from 10/17/2020 in [facility]  PHQ-2 Total Score 1 0 3 1 0  PHQ-9 Total Score -- -- 6 2 --       [place] Office Visit from 02/27/2022 in [facility] ED to Hosp-Admission (Discharged) from 02/22/2022 in [facility] ED to Hosp-Admission (Discharged) from  01/12/2022 in [facility] GENERAL SURGERY  C-SSRS RISK CATEGORY No Risk No Risk No Risk        Assessment and Plan: [name] is a 66 year old African-American male with history of bipolar disorder, type 2 diabetes mellitus, chronic kidney disease stage IIIb, congestive heart failure, multiple other medical problems as noted above, including Mental disorder likely frontal-temporal, recently discharged after severe sepsis, UTI, currently recovering, although anxious is currently coping and is compliant on medications.  Will benefit from the following plan.  Plan Bipolar disorder type II in remission Trileptal 300 mg p.o. twice daily Seroquel 100 mg p.o. nightly Seroquel 50 mg as needed for severe anxiety/agitation.  Insomnia-stable Seroquel 100 mg p.o. nightly  GAD-improving Patient was referred for CBT in the past. Seroquel 100 mg p.o. nightly and 50 mg as needed.  Major neurocognitive disorder chronic-currently under the care of neurology.  Patient is on memantine.   Collateral information obtained from spouse as noted above.  Have also reviewed medical records from most recent hospital admission-02/22/2022 - 02/25/2022 as noted above. Reviewed recent labs-sodium level-135-dated 02/25/2022, creatinine-1.62-does have a history of chronic kidney disease, patient does have hemoglobin-9.1-8/25/2023, RBC-low at 3.55, hematocrit low at 30-platelet count-247-within normal limits.  Patient advised to follow-up with primary care provider.  Patient with elevated blood pressure in session, blood pressure repeated-patient to keep a log and follow-up with primary care provider.  Reviewed EKG-dated 02/22/2022-[id number].  Follow-up in clinic in 3 to 4 months  or sooner if needed.  This note was generated in part or whole with voice recognition software. Voice recognition is usually quite accurate but there are transcription errors that can and very often do occur. I apologize for any typographical errors that were not detected and corrected.    [name], MD 02/28/2022, 8:32 AM

## 2022-02-27 NOTE — Progress Notes (Unsigned)
error 

## 2022-02-28 NOTE — Discharge Summary (Signed)
Physician Discharge Summary   [name] MRN: [id number] DOB: Oct 29, 1955  Admit date:     02/22/2022  Discharge date: 02/25/22  Discharge Physician: Enedina Finner   PCP: Dione Booze, MD   Recommendations at discharge:   F/u PCP in 1-2 weeks Foley care per protocol  Discharge Diagnoses:  Severe sepsis due to recurrent complicated UTI with chronic foley  Hospital Course:  Severe sepsis due to complicated UTI and possible left inner thigh cellulitis: Patient meets criteria for severe sepsis with fever 101.7, heart rate up to 121, RR 28.  Lactic acid 3.5.  Patient has mild leukocytosis to 11.9.   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 -see above -- appears to be stable today. Do not feel any redness on the thighs -- Wound consult for bilateral chronic healing wounds-- appreciate input   Complicated UTI (urinary tract infection) due to chronic Foley catheter -see above -- patient follows with [facility] urology. His catheter was changed on 15 February 2022   Type II diabetes mellitus with renal manifestations (HCC) Recent A1c 6.8, well controlled.  Patient is taking glipizide at home -Sliding scale insulin   Essential hypertension -Hold amlodipine- olmesartan due to severe sepsis and high risk of developing hypotension -IV hydralazine as needed   Chronic indwelling Foley catheter - Last  Change was 02/15/22   Obesity with body mass index of 30.0-39.9    BMI=34.22   and BW= 96.2 -Diet and exercise.   -Encourage to lose weight.   Chronic diastolic CHF (congestive heart failure) (HCC) 2D echo on 10/07/2021 showed EF 60-65% with grade 3 diastolic dysfunction.  Patient does have leg edema.  No JVD.  BNP 37.8.  CHF is compensated. -Watch volume status closely   Acute renal failure superimposed on stage 3b chronic kidney disease (HCC) Likely due to UTI -Follow-up with BMP -Treat UTI as above   TBI (traumatic brain injury) (HCC) - Has cognitive impairment -Continue Namenda   Hyponatremia Sodium 129.  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 and patient was instructed, not to drive, operate heavy machinery, perform activities at heights, swimming or participation in water activities or provide baby-sitting services while on Pain, Sleep and Anxiety Medications; until their outpatient Physician has advised to do so again. Also recommended to not to take more than prescribed Pain, Sleep and Anxiety Medications.  Discharge Diet Orders (From admission, onward)     Start     Ordered   02/25/22 0000  Diet - low sodium heart healthy        02/25/22 1526           Cardiac and Carb modified diet DISCHARGE MEDICATION: Allergies as of 02/25/2022  No Known Allergies      Medication List     TAKE these medications    amLODipine-olmesartan 5-20 MG tablet Commonly known as: AZOR Take 1 tablet by mouth daily.   ascorbic acid 500 MG tablet Commonly known as: VITAMIN C Take 1 tablet (500 mg total) by mouth daily.   ciprofloxacin 500 MG tablet Commonly known as:  CIPRO Take 1 tablet (500 mg total) by mouth 2 (two) times daily for 4 days.   cyanocobalamin 1000 MCG tablet Commonly known as: VITAMIN B12 Take 3,000 mcg by mouth daily.   Eliquis 5 MG Tabs tablet Generic drug: apixaban Take 5 mg by mouth 2 (two) times daily.   fish oil-omega-3 fatty acids 1000 MG capsule SMARTSIG:1 Capsule(s) By Mouth Daily   glipiZIDE 2.5 MG 24 hr tablet Commonly known as: GLUCOTROL XL Take 1 tablet (2.5 mg total) by mouth daily with breakfast.   memantine 10 MG tablet Commonly known as: NAMENDA Take 10 mg by mouth 2 (two) times daily.   rosuvastatin 20 MG tablet Commonly known as: CRESTOR Take 20 mg by mouth daily.   vitamin D3 25 MCG tablet Commonly known as: CHOLECALCIFEROL Take 1 tablet (1,000 Units total) by mouth daily.   zinc sulfate 220 (50 Zn) MG capsule Take 1 capsule (220 mg total) by mouth daily.               Discharge Care Instructions  (From admission, onward)           Start     Ordered   02/25/22 0000  Discharge wound care:       Comments: 02/24/22 0742    Wound care  Daily at 5am      Comments: Wound care to left chronic, nonhealing pressure injury (Stage 3): wash with soap and water, rinse and pat dry. Cover with xeroform gauze, top with dry gauze and secure with Kerlix roll gauze/paper tape.  02/24/22 0741   02/24/22 0725    Wound care  Daily at 5am      Comments: Wound care to sacral chronic, nonhealing wound, Stage 4 and Right heel chronic nonhealing wound, Stage 3:  Wash with soap and water, rinse and dry. Fill/cover with silver hydrofiber dressing (Aquacel Ag+ Advantage, [employer] # [id number]), top with dry gauze and secure. Secure heel with Kerlix roll gauze/paper tape, secure sacral wound with ABD pad, 2-inch paper tape or silicone foam.   02/25/22 1526            Follow-up Information     Dione Booze, MD. Schedule an appointment as soon as possible for a visit in 1 week(s).   Specialty: Internal  Medicine Contact information: [address] [place] [address] [phone]         [facility]. Go in 3 week(s).   Why: recurrent UTI, As needed        [facility]. Go to.   Specialty: Wound Care Why: on your routine appt Contact information: [address] [id number] [place] [phone]               Discharge Exam: Ceasar Mons Weights   02/22/22 1153 02/24/22 0459 02/25/22 0600  Weight: 96.2 kg 120.1 kg 119.9 kg     Condition at discharge: fair  The results of significant diagnostics from this hospitalization (including imaging, microbiology, ancillary and laboratory) are listed below for reference.   Imaging Studies: US RENAL  Result Date: 02/25/2022 CLINICAL DATA:  Hydronephrosis  EXAM: RENAL / URINARY TRACT ULTRASOUND COMPLETE COMPARISON:  CT scan of the abdomen and pelvis January 12, 2022 FINDINGS: Right Kidney: Renal measurements: 11.4 x 5.1 by 4.6 cm = volume: 140 mL. Mild hydronephrosis. Left Kidney: Renal measurements: 11.2 x 5.4 x 5.1 cm = volume: 160 mL. Echogenicity within normal limits. No mass or hydronephrosis visualized. Bladder: Decompressed with a Foley catheter. Other: None. IMPRESSION: 1. Mild hydronephrosis on the right. 2. The left kidney is normal. 3. The bladder are a Foley catheter. Electronically Signed   By: Gerome Sam III M.D.   On: 02/25/2022 13:58   DG Chest 2 View  Result Date: 02/22/2022 CLINICAL DATA:  Suspected sepsis. EXAM: CHEST - 2 VIEW COMPARISON:  Chest x-ray 01/12/2022. FINDINGS: No consolidation. No visible pleural effusion or pneumothorax. Possible enlargement of the pulmonary arteries. Cardiomediastinal silhouette is otherwise within normal limits. No evidence of acute osseous abnormality. Polyarticular degenerative change. IMPRESSION: 1. No consolidation. 2. Possible enlargement of the pulmonary arteries, as can be seen with pulmonary arterial hypertension.  Electronically Signed   By: Feliberto Harts M.D.   On: 02/22/2022 12:53    Microbiology: Results for orders placed or performed during the hospital encounter of 02/22/22  Culture, blood (Routine x 2)     Status: None   Collection Time: 02/22/22 12:03 PM   Specimen: BLOOD  Result Value Ref Range Status   Specimen Description BLOOD RIGHT ANTECUBITAL  Final   Special Requests   Final    BOTTLES DRAWN AEROBIC AND ANAEROBIC Blood Culture adequate volume   Culture   Final    NO GROWTH 5 DAYS Performed at [facility], [address] [place], [place] [address]    Report Status 02/27/2022 FINAL  Final  Urine Culture     Status: Abnormal   Collection Time: 02/22/22 12:03 PM   Specimen: In/Out Cath Urine  Result Value Ref Range Status   Specimen Description   Final    IN/OUT CATH URINE Performed at [facility], [address], [place], [place] [address]    Special Requests   Final    NONE Performed at [facility], [address] [place], [place] [address]    Culture (A)  Final    >=100,000 COLONIES/mL ACINETOBACTER CALCOACETICUS/BAUMANNII COMPLEX >=100,000 COLONIES/mL STAPHYLOCOCCUS AUREUS    Report Status 02/25/2022 FINAL  Final   Organism ID, Bacteria ACINETOBACTER CALCOACETICUS/BAUMANNII COMPLEX (A)  Final   Organism ID, Bacteria STAPHYLOCOCCUS AUREUS (A)  Final      Susceptibility   Acinetobacter calcoaceticus/baumannii complex - MIC*    CEFTAZIDIME 4 SENSITIVE Sensitive     CIPROFLOXACIN <=0.25 SENSITIVE Sensitive     GENTAMICIN <=1 SENSITIVE Sensitive     IMIPENEM <=0.25 SENSITIVE Sensitive     PIP/TAZO <=4 SENSITIVE Sensitive     TRIMETH/SULFA <=20 SENSITIVE Sensitive     AMPICILLIN/SULBACTAM <=2 SENSITIVE Sensitive     * >=100,000 COLONIES/mL ACINETOBACTER CALCOACETICUS/BAUMANNII COMPLEX   Staphylococcus aureus - MIC*    CIPROFLOXACIN <=0.5 SENSITIVE Sensitive     GENTAMICIN <=0.5 SENSITIVE Sensitive     NITROFURANTOIN <=16 SENSITIVE  Sensitive     OXACILLIN 0.5 SENSITIVE Sensitive     TETRACYCLINE <=1 SENSITIVE Sensitive     VANCOMYCIN 1 SENSITIVE Sensitive     TRIMETH/SULFA <=10 SENSITIVE Sensitive     CLINDAMYCIN <=0.25 SENSITIVE Sensitive     RIFAMPIN <=0.5 SENSITIVE Sensitive     Inducible Clindamycin NEGATIVE Sensitive     * >=100,000 COLONIES/mL STAPHYLOCOCCUS AUREUS  Culture,  blood (Routine x 2)     Status: None   Collection Time: 02/22/22 12:05 PM   Specimen: BLOOD  Result Value Ref Range Status   Specimen Description BLOOD LEFT ANTECUBITAL  Final   Special Requests   Final    BOTTLES DRAWN AEROBIC AND ANAEROBIC Blood Culture adequate volume   Culture   Final    NO GROWTH 5 DAYS Performed at [facility], [address] [place], [place] [address]    Report Status 02/27/2022 FINAL  Final  Resp Panel by RT-PCR (Flu A&B, Covid) Anterior Nasal Swab     Status: None   Collection Time: 02/22/22  1:25 PM   Specimen: Anterior Nasal Swab  Result Value Ref Range Status   SARS Coronavirus 2 by RT PCR NEGATIVE NEGATIVE Final    Comment: (NOTE) SARS-CoV-2 target nucleic acids are NOT DETECTED.  The SARS-CoV-2 RNA is generally detectable in upper respiratory specimens during the acute phase of infection. The lowest concentration of SARS-CoV-2 viral copies this assay can detect is 138 copies/mL. A negative result does not preclude SARS-Cov-2 infection and should not be used as the sole basis for treatment or other patient management decisions. A negative result may occur with  improper specimen collection/handling, submission of specimen other than nasopharyngeal swab, presence of viral mutation(s) within the areas targeted by this assay, and inadequate number of viral copies(<138 copies/mL). A negative result must be combined with clinical observations, patient history, and epidemiological information. The expected result is Negative.  Fact Sheet for Patients:   EntrepreneurPulse.com.au  Fact Sheet for Healthcare Providers:  IncredibleEmployment.be  This test is no t yet approved or cleared by the [place] FDA and  has been authorized for detection and/or diagnosis of SARS-CoV-2 by FDA under an Emergency Use Authorization (EUA). This EUA will remain  in effect (meaning this test can be used) for the duration of the COVID-19 declaration under Section 564(b)(1) of the Act, 21 U.S.C.section 360bbb-3(b)(1), unless the authorization is terminated  or revoked sooner.       Influenza A by PCR NEGATIVE NEGATIVE Final   Influenza B by PCR NEGATIVE NEGATIVE Final    Comment: (NOTE) The Xpert Xpress SARS-CoV-2/FLU/RSV plus assay is intended as an aid in the diagnosis of influenza from Nasopharyngeal swab specimens and should not be used as a sole basis for treatment. Nasal washings and aspirates are unacceptable for Xpert Xpress SARS-CoV-2/FLU/RSV testing.  Fact Sheet for Patients: EntrepreneurPulse.com.au  Fact Sheet for Healthcare Providers: IncredibleEmployment.be  This test is not yet approved or cleared by the [place] FDA and has been authorized for detection and/or diagnosis of SARS-CoV-2 by FDA under an Emergency Use Authorization (EUA). This EUA will remain in effect (meaning this test can be used) for the duration of the COVID-19 declaration under Section 564(b)(1) of the Act, 21 U.S.C. section 360bbb-3(b)(1), unless the authorization is terminated or revoked.  Performed at [facility], [address] [place], [place] [address]     Labs: CBC: Recent Labs  Lab 02/22/22 1218 02/23/22 0615  WBC 11.9* 8.7  NEUTROABS 10.2*  --   HGB 9.1* 7.8*  HCT 30.0* 25.6*  MCV 84.5 81.5  PLT 247 [id number]   Basic Metabolic Panel: Recent Labs  Lab 02/22/22 1258 02/22/22 1436 02/22/22 1754 02/23/22 0615 02/25/22 1422  NA 129* 128* 134* 130* 135   K 4.7 4.6 3.4* 4.0 4.2  CL 100 103 111 106 110  CO2 19* 20* 18* 19* 22  GLUCOSE 291*  242* 131* 135* 129*  BUN 25* 24* 21 24* 22  CREATININE 2.09* 2.04* 1.48* 1.98* 1.62*  CALCIUM 8.5* 8.2* 6.5* 8.0* 8.5*   Liver Function Tests: Recent Labs  Lab 02/22/22 1258  AST 20  ALT 14  ALKPHOS 92  BILITOT 0.6  PROT 8.5*  ALBUMIN 2.9*   CBG: Recent Labs  Lab 02/24/22 1637 02/24/22 2051 02/25/22 0804 02/25/22 1140 02/25/22 1630  GLUCAP 151* 175* 128* 148* 117*    Discharge time spent: greater than 30 minutes.  Signed: Fritzi Mandes, MD [facility] 02/28/2022

## 2022-03-08 ENCOUNTER — Encounter: Payer: Medicare Other | Attending: Physician Assistant | Admitting: Physician Assistant

## 2022-03-08 DIAGNOSIS — E11622 Type 2 diabetes mellitus with other skin ulcer: Secondary | ICD-10-CM | POA: Diagnosis present

## 2022-03-08 DIAGNOSIS — L98412 Non-pressure chronic ulcer of buttock with fat layer exposed: Secondary | ICD-10-CM | POA: Diagnosis not present

## 2022-03-08 DIAGNOSIS — L89623 Pressure ulcer of left heel, stage 3: Secondary | ICD-10-CM | POA: Insufficient documentation

## 2022-03-08 DIAGNOSIS — L89154 Pressure ulcer of sacral region, stage 4: Secondary | ICD-10-CM | POA: Diagnosis not present

## 2022-03-08 DIAGNOSIS — L24A0 Irritant contact dermatitis due to friction or contact with body fluids, unspecified: Secondary | ICD-10-CM | POA: Diagnosis not present

## 2022-03-08 DIAGNOSIS — L89613 Pressure ulcer of right heel, stage 3: Secondary | ICD-10-CM | POA: Insufficient documentation

## 2022-03-08 DIAGNOSIS — E1140 Type 2 diabetes mellitus with diabetic neuropathy, unspecified: Secondary | ICD-10-CM | POA: Diagnosis not present

## 2022-03-08 DIAGNOSIS — I10 Essential (primary) hypertension: Secondary | ICD-10-CM | POA: Insufficient documentation

## 2022-03-08 NOTE — Progress Notes (Signed)
TIAM, NELLER ([id number]) Visit Report for 03/08/2022 Arrival Information Details Patient Name: [name], [name] Date of Service: 03/08/2022 7:30 AM Medical Record Number: [id number] Patient Account Number: [id number] Date of Birth/Sex: July 01, 1956 (65 y.o. M) Treating RN: Cornell Barman Primary Care Inga Noller: [name] Other Clinician: Massie Kluver Referring Garreth Burnsworth: [name] Treating Savon Bordonaro/Extender: Skipper Cliche in Treatment: 15 Visit Information History Since Last Visit All ordered tests and consults were completed: No Patient Arrived: Wheel Chair Added or deleted any medications: No Arrival Time: 07:32 Any new allergies or adverse reactions: No Transfer Assistance: EasyPivot Patient Lift Had a fall or experienced change in No Patient Requires Transmission-Based No activities of daily living that may affect Precautions: risk of falls: Patient Has Alerts: Yes Hospitalized since last visit: No Patient Alerts: Patient on Blood Pain Present Now: No Thinner Electronic Signature(s) Signed: 03/08/2022 12:49:02 PM By: Massie Kluver Entered By: Massie Kluver on 03/08/2022 07:40:13 [name], [name] ([id number]) -------------------------------------------------------------------------------- Clinic Level of Care Assessment Details Patient Name: [name] Date of Service: 03/08/2022 7:30 AM Medical Record Number: [id number] Patient Account Number: [id number] Date of Birth/Sex: Dec 01, 1955 (65 y.o. M) Treating RN: Cornell Barman Primary Care Delane Wessinger: [name] Other Clinician: Massie Kluver Referring Malek Skog: [name] Treating Abundio Teuscher/Extender: Skipper Cliche in Treatment: 15 Clinic Level of Care Assessment Items TOOL 1 Quantity Score []  - Use when EandM and Procedure is performed on INITIAL visit 0 ASSESSMENTS - Nursing Assessment / Reassessment []  - General Physical Exam (combine w/ comprehensive assessment  (listed just below) when performed on new 0 pt. evals) []  - 0 Comprehensive Assessment (HX, ROS, Risk Assessments, Wounds Hx, etc.) ASSESSMENTS - Wound and Skin Assessment / Reassessment []  - Dermatologic / Skin Assessment (not related to wound area) 0 ASSESSMENTS - Ostomy and/or Continence Assessment and Care []  - Incontinence Assessment and Management 0 []  - 0 Ostomy Care Assessment and Management (repouching, etc.) PROCESS - Coordination of Care []  - Simple Patient / Family Education for ongoing care 0 []  - 0 Complex (extensive) Patient / Family Education for ongoing care []  - 0 Staff obtains Programmer, systems, Records, Test Results / Process Orders []  - 0 Staff telephones HHA, Nursing Homes / Clarify orders / etc []  - 0 Routine Transfer to another Facility (non-emergent condition) []  - 0 Routine Hospital Admission (non-emergent condition) []  - 0 New Admissions / Biomedical engineer / Ordering NPWT, Apligraf, etc. []  - 0 Emergency Hospital Admission (emergent condition) PROCESS - Special Needs []  - Pediatric / Minor Patient Management 0 []  - 0 Isolation Patient Management []  - 0 Hearing / Language / Visual special needs []  - 0 Assessment of Community assistance (transportation, D/C planning, etc.) []  - 0 Additional assistance / Altered mentation []  - 0 Support Surface(s) Assessment (bed, cushion, seat, etc.) INTERVENTIONS - Miscellaneous []  - External ear exam 0 []  - 0 Patient Transfer (multiple staff / Civil Service fast streamer / Similar devices) []  - 0 Simple Staple / Suture removal (25 or less) []  - 0 Complex Staple / Suture removal (26 or more) []  - 0 Hypo/Hyperglycemic Management (do not check if billed separately) []  - 0 Ankle / Brachial Index (ABI) - do not check if billed separately Has the patient been seen at the hospital within the last three years: Yes Total Score: 0 Level Of Care: ____ [name] ([id number]) Electronic Signature(s) Signed: 03/08/2022  12:49:02 PM By: Massie Kluver Entered By: Massie Kluver on 03/08/2022 08:05:50 [name], [name] ([id number]) -------------------------------------------------------------------------------- Encounter Discharge Information Details Patient Name: [name] Date of Service: 03/08/2022 7:30  AM Medical Record Number: [id number] Patient Account Number: [id number] Date of Birth/Sex: 01-16-1956 (65 y.o. M) Treating RN: Cornell Barman Primary Care Radek Carnero: [name] Other Clinician: Massie Kluver Referring Trendon Zaring: [name] Treating Krisi Azua/Extender: Skipper Cliche in Treatment: 15 Encounter Discharge Information Items Post Procedure Vitals Discharge Condition: Stable Temperature (F): 97.8 Ambulatory Status: Wheelchair Pulse (bpm): 93 Discharge Destination: Home Respiratory Rate (breaths/min): 18 Transportation: Other Blood Pressure (mmHg): 176/93 Accompanied By: wife Schedule Follow-up Appointment: No Clinical Summary of Care: Electronic Signature(s) Signed: 03/08/2022 12:49:02 PM By: Massie Kluver Entered By: Massie Kluver on 03/08/2022 08:49:37 [name], [name] ([id number]) -------------------------------------------------------------------------------- Lower Extremity Assessment Details Patient Name: [name] Date of Service: 03/08/2022 7:30 AM Medical Record Number: [id number] Patient Account Number: [id number] Date of Birth/Sex: 1956/06/13 (65 y.o. M) Treating RN: Cornell Barman Primary Care Jaydn Moscato: [name] Other Clinician: Massie Kluver Referring Tremel Setters: [name] Treating Deondrick Searls/Extender: Skipper Cliche in Treatment: 15 Electronic Signature(s) Signed: 03/08/2022 12:49:02 PM By: Massie Kluver Signed: 03/08/2022 1:46:23 PM By: [name] BSN, RN, CWS, Kim RN, BSN Entered By: Massie Kluver on 03/08/2022 07:55:51 [name], [name]  ([id number]) -------------------------------------------------------------------------------- Multi Wound Chart Details Patient Name: [name] Date of Service: 03/08/2022 7:30 AM Medical Record Number: [id number] Patient Account Number: [id number] Date of Birth/Sex: 30-Dec-1955 (65 y.o. M) Treating RN: Cornell Barman Primary Care Deborah Dondero: [name] Other Clinician: Massie Kluver Referring Loukas Antonson: [name] Treating Tinzlee Craker/Extender: Skipper Cliche in Treatment: 15 Vital Signs Height(in): 6 Pulse(bpm): 36 Weight(lbs): 37 Blood Pressure(mmHg): 176/93 Body Mass Index(BMI): 45 Temperature(F): 97.8 Respiratory Rate(breaths/min): 18 Photos: [N/A:N/A] Wound Location: Right Calcaneus Sacrum N/A Wounding Event: Pressure Injury Pressure Injury N/A Primary Etiology: Pressure Ulcer Pressure Ulcer N/A Comorbid History: Anemia, Sleep Apnea, Coronary Anemia, Sleep Apnea, Coronary N/A Artery Disease, Deep Vein Artery Disease, Deep Vein Thrombosis, Hypertension, Type II Thrombosis, Hypertension, Type II Diabetes, History of pressure Diabetes, History of pressure wounds, Neuropathy wounds, Neuropathy Date Acquired: 07/01/2021 07/01/2021 N/A Weeks of Treatment: 15 15 N/A Wound Status: Open Open N/A Wound Recurrence: No No N/A Measurements L x W x D (cm) 3.2x3.5x0.4 2x1x4.1 N/A Area (cm) : 8.796 1.571 N/A Volume (cm) : 3.519 6.44 N/A % Reduction in Area: -112.50% 50.00% N/A % Reduction in Volume: -750.00% 31.70% N/A Classification: Category/Stage III Category/Stage IV N/A Exudate Amount: Medium Medium N/A Exudate Type: Serosanguineous Serosanguineous N/A Exudate Color: red, brown red, brown N/A Granulation Amount: Large (67-100%) Large (67-100%) N/A Granulation Quality: Red, Pink Red N/A Necrotic Amount: Small (1-33%) None Present (0%) N/A Exposed Structures: Fat Layer (Subcutaneous Tissue): Fat Layer (Subcutaneous Tissue): N/A Yes Yes Fascia: No Fascia:  No Tendon: No Tendon: No Muscle: No Muscle: No Joint: No Joint: No Bone: No Bone: No Epithelialization: None None N/A Treatment Notes Electronic Signature(s) Signed: 03/08/2022 12:49:02 PM By: Massie Kluver Entered By: Massie Kluver on 03/08/2022 07:56:00 Underhill, [name] ([id number]) -------------------------------------------------------------------------------- [place] Details Patient Name: [name] Date of Service: 03/08/2022 7:30 AM Medical Record Number: [id number] Patient Account Number: [id number] Date of Birth/Sex: May 20, 1956 (65 y.o. M) Treating RN: Cornell Barman Primary Care Bayyinah Dukeman: [name] Other Clinician: Massie Kluver Referring Walid Haig: [name] Treating Gladiola Madore/Extender: Skipper Cliche in Treatment: 15 Active Inactive Wound/Skin Impairment Nursing Diagnoses: Knowledge deficit related to ulceration/compromised skin integrity Goals: Patient/caregiver will verbalize understanding of skin care regimen Date Initiated: 11/22/2021 Target Resolution Date: 02/22/2022 Goal Status: Active Ulcer/skin breakdown will have a volume reduction of 30% by week 4 Date Initiated: 11/22/2021 Date Inactivated: 01/10/2022 Target Resolution Date: 12/23/2021 Goal Status: Unmet Unmet Reason:  comorbities Ulcer/skin breakdown will have a volume reduction of 50% by week 8 Date Initiated: 11/22/2021 Target Resolution Date: 01/22/2022 Goal Status: Active Ulcer/skin breakdown will have a volume reduction of 80% by week 12 Date Initiated: 11/22/2021 Target Resolution Date: 02/22/2022 Goal Status: Active Ulcer/skin breakdown will heal within 14 weeks Date Initiated: 11/22/2021 Target Resolution Date: 03/25/2022 Goal Status: Active Interventions: Assess patient/caregiver ability to obtain necessary supplies Assess patient/caregiver ability to perform ulcer/skin care regimen upon admission and as needed Assess ulceration(s) every  visit Notes: Electronic Signature(s) Signed: 03/08/2022 12:49:02 PM By: Betha Loa Signed: 03/08/2022 1:46:23 PM By: Elliot Gurney, BSN, RN, CWS, Kim RN, BSN Entered By: Betha Loa on 03/08/2022 07:55:54 York, [name] ([id number]) -------------------------------------------------------------------------------- Pain Assessment Details Patient Name: [name] Date of Service: 03/08/2022 7:30 AM Medical Record Number: [id number] Patient Account Number: [id number] Date of Birth/Sex: Mar 12, 1956 (65 y.o. M) Treating RN: Huel Coventry Primary Care Marinus Eicher: Dione Booze Other Clinician: Betha Loa Referring Milah Recht: Dione Booze Treating Salote Weidmann/Extender: Rowan Blase in Treatment: 15 Active Problems Location of Pain Severity and Description of Pain Patient Has Paino No Site Locations Pain Management and Medication Current Pain Management: Electronic Signature(s) Signed: 03/08/2022 12:49:02 PM By: Betha Loa Signed: 03/08/2022 1:46:23 PM By: Elliot Gurney, BSN, RN, CWS, Kim RN, BSN Entered By: Betha Loa on 03/08/2022 07:43:04 [place], [name] ([id number]) -------------------------------------------------------------------------------- Patient/Caregiver Education Details Patient Name: [name] Date of Service: 03/08/2022 7:30 AM Medical Record Number: [id number] Patient Account Number: [id number] Date of Birth/Gender: 1955-11-13 (65 y.o. M) Treating RN: Huel Coventry Primary Care Physician: Dione Booze Other Clinician: Betha Loa Referring Physician: Dione Booze Treating Physician/Extender: Rowan Blase in Treatment: 15 Education Assessment Education Provided To: Patient and Caregiver Education Topics Provided Wound/Skin Impairment: Handouts: Other: continue wound care as directed Methods: Explain/Verbal Responses: State content correctly Electronic Signature(s) Signed: 03/08/2022 12:49:02 PM By: Betha Loa Entered By: Betha Loa on 03/08/2022 08:48:49 Jacobs, [name] ([id number]) -------------------------------------------------------------------------------- Wound Assessment Details Patient Name: [name] Date of Service: 03/08/2022 7:30 AM Medical Record Number: [id number] Patient Account Number: [id number] Date of Birth/Sex: [birth year] (65 y.o. M) Treating RN: Huel Coventry Primary Care Jese Comella: Dione Booze Other Clinician: Betha Loa Referring Sherri Levenhagen: Dione Booze Treating Atilla Zollner/Extender: Allen Derry Weeks in Treatment: 15 Wound Status Wound Number: 5 Primary Pressure Ulcer Etiology: Wound Location: Right Calcaneus Wound Open Wounding Event: Pressure Injury Status: Date Acquired: 07/01/2021 Comorbid Anemia, Sleep Apnea, Coronary Artery Disease, Deep Vein Weeks Of Treatment: 15 History: Thrombosis, Hypertension, Type II Diabetes, History of Clustered Wound: No pressure wounds, Neuropathy Photos Wound Measurements Length: (cm) 3.2 Width: (cm) 3.5 Depth: (cm) 0.4 Area: (cm) 8.796 Volume: (cm) 3.519 % Reduction in Area: -112.5% % Reduction in Volume: -750% Epithelialization: None Wound Description Classification: Category/Stage III Foul O Exudate Amount: Medium Slough Exudate Type: Serosanguineous Exudate Color: red, brown dor After Cleansing: No /Fibrino Yes Wound Bed Granulation Amount: Large (67-100%) Exposed Structure Granulation Quality: Red, Pink Fascia Exposed: No Necrotic Amount: Small (1-33%) Fat Layer (Subcutaneous Tissue) Exposed: Yes Necrotic Quality: Adherent Slough Tendon Exposed: No Muscle Exposed: No Joint Exposed: No Bone Exposed: No Treatment Notes Wound #5 (Calcaneus) Wound Laterality: Right Cleanser Dakin 16 (oz) 0.25 Discharge Instruction: wet to dry Peri-Wound Care [place], [name] ([id number]) Topical Primary Dressing Hydrofera Blue Ready Transfer Foam, 2.5x2.5 (in/in) Discharge  Instruction: Apply Hydrofera Blue Ready to wound bed as directed Secondary Dressing ABD Pad 5x9 (in/in) Discharge Instruction: Cover with ABD pad Secured With Medipore Tape - 60M Medipore H Soft Cloth Surgical Tape, 2x2 (in/yd) Conform 4'' -  Conforming Stretch Gauze Bandage 4x75 (in/in) Discharge Instruction: Apply as directed Compression Wrap Compression Stockings Add-Ons Electronic Signature(s) Signed: 03/08/2022 12:49:02 PM By: Betha Loa Signed: 03/08/2022 1:46:23 PM By: Elliot Gurney, BSN, RN, CWS, Kim RN, BSN Entered By: Betha Loa on 03/08/2022 07:55:03 Mogg, [name] ([id number]) -------------------------------------------------------------------------------- Wound Assessment Details Patient Name: [name] Date of Service: 03/08/2022 7:30 AM Medical Record Number: [id number] Patient Account Number: [id number] Date of Birth/Sex: Sep 26, 1955 (65 y.o. M) Treating RN: Huel Coventry Primary Care Hisae Decoursey: Dione Booze Other Clinician: Betha Loa Referring Dalyn Becker: Dione Booze Treating Taylen Osorto/Extender: Allen Derry Weeks in Treatment: 15 Wound Status Wound Number: 7 Primary Pressure Ulcer Etiology: Wound Location: Sacrum Wound Open Wounding Event: Pressure Injury Status: Date Acquired: 07/01/2021 Comorbid Anemia, Sleep Apnea, Coronary Artery Disease, Deep Vein Weeks Of Treatment: 15 History: Thrombosis, Hypertension, Type II Diabetes, History of Clustered Wound: No pressure wounds, Neuropathy Photos Wound Measurements Length: (cm) 2 Width: (cm) 1 Depth: (cm) 4.1 Area: (cm) 1.57 Volume: (cm) 6.44 % Reduction in Area: 50% % Reduction in Volume: 31.7% Epithelialization: None 1 Wound Description Classification: Category/Stage IV Fo Exudate Amount: Medium Sl Exudate Type: Serosanguineous Exudate Color: red, brown ul Odor After Cleansing: No ough/Fibrino Yes Wound Bed Granulation Amount: Large (67-100%) Exposed Structure Granulation  Quality: Red Fascia Exposed: No Necrotic Amount: None Present (0%) Fat Layer (Subcutaneous Tissue) Exposed: Yes Tendon Exposed: No Muscle Exposed: No Joint Exposed: No Bone Exposed: No Treatment Notes Wound #7 (Sacrum) Cleanser Dakin 16 (oz) 0.25 Discharge Instruction: Use as directed. Peri-Wound Care TYRRELL, STEPHENS ([id number]) Topical calmoseptine Discharge Instruction: apply to excorated areas Primary Dressing Hydrofera Blue Ready Transfer Foam, 4x5 (in/in) Discharge Instruction: spiral hydrofera blue to lightly pack into wound, then cut to cover wound Secondary Dressing ABD Pad 5x9 (in/in) Discharge Instruction: Cover with ABD pad Secured With Medipore Tape - 37M Medipore H Soft Cloth Surgical Tape, 2x2 (in/yd) Compression Wrap Compression Stockings Add-Ons Electronic Signature(s) Signed: 03/08/2022 12:49:02 PM By: Betha Loa Signed: 03/08/2022 1:46:23 PM By: Elliot Gurney, BSN, RN, CWS, Kim RN, BSN Entered By: Betha Loa on 03/08/2022 07:55:43 Mccully, [name] ([id number]) -------------------------------------------------------------------------------- Vitals Details Patient Name: [name] Date of Service: 03/08/2022 7:30 AM Medical Record Number: [id number] Patient Account Number: [id number] Date of Birth/Sex: June 27, 1956 (65 y.o. M) Treating RN: Huel Coventry Primary Care Kalep Full: Dione Booze Other Clinician: Betha Loa Referring Nahiem Dredge: Dione Booze Treating Timmya Blazier/Extender: Rowan Blase in Treatment: 15 Vital Signs Time Taken: 07:40 Temperature (F): 97.8 Height (in): 66 Pulse (bpm): 93 Weight (lbs): 279 Respiratory Rate (breaths/min): 18 Body Mass Index (BMI): 45 Blood Pressure (mmHg): 176/93 Reference Range: 80 - 120 mg / dl Notes Patient has not taken medication this morning Electronic Signature(s) Signed: 03/08/2022 12:49:02 PM By: Betha Loa Entered By: Betha Loa on 03/08/2022 07:43:00

## 2022-03-08 NOTE — Progress Notes (Addendum)
WARNELL, RASNIC ([id number]) Visit Report for 03/08/2022 Chief Complaint Document Details Patient Name: [name], [name] Date of Service: 03/08/2022 7:30 AM Medical Record Number: [id number] Patient Account Number: [id number] Date of Birth/Sex: Mar 07, 1956 (65 y.o. M) Treating RN: Cornell Barman Primary Care Provider: [name] Other Clinician: Massie Kluver Referring Provider: [name] Treating Provider/Extender: Skipper Cliche in Treatment: 15 Information Obtained from: Patient Chief Complaint Sacral, right gluteal, and bilateral heel ulcers Electronic Signature(s) Signed: 03/08/2022 7:47:45 AM By: [name] PA-C Entered By: [name] on 03/08/2022 07:47:45 [name], [name] ([id number]) -------------------------------------------------------------------------------- Debridement Details Patient Name: [name] Date of Service: 03/08/2022 7:30 AM Medical Record Number: [id number] Patient Account Number: [id number] Date of Birth/Sex: [birth year] (65 y.o. M) Treating RN: Cornell Barman Primary Care Provider: [name] Other Clinician: Massie Kluver Referring Provider: [name] Treating Provider/Extender: Skipper Cliche in Treatment: 15 Debridement Performed for Wound #5 Right Calcaneus Assessment: Performed By: Clinician Cornell Barman, RN Debridement Type: Debridement Level of Consciousness (Pre- Awake and Alert procedure): Pre-procedure Verification/Time Out Yes - 08:01 Taken: Start Time: 08:01 Total Area Debrided (L x W): 3.2 (cm) x 3.5 (cm) = 11.2 (cm) Tissue and other material Viable, Non-Viable, Callus, Slough, Subcutaneous, Slough debrided: Level: Skin/Subcutaneous Tissue Debridement Description: Excisional Instrument: Curette Bleeding: Minimum Hemostasis Achieved: Pressure End Time: 08:03 Response to Treatment: Procedure was tolerated well Level of Consciousness (Post- Awake and  Alert procedure): Post Debridement Measurements of Total Wound Length: (cm) 3.2 Stage: Category/Stage III Width: (cm) 3.5 Depth: (cm) 0.5 Volume: (cm) 4.398 Character of Wound/Ulcer Post Debridement: Stable Post Procedure Diagnosis Same as Pre-procedure Electronic Signature(s) Signed: 03/08/2022 12:29:30 PM By: [name] PA-C Signed: 03/08/2022 12:49:02 PM By: Massie Kluver Signed: 03/08/2022 1:46:23 PM By: [name], BSN, RN, CWS, Kim RN, BSN Entered By: Massie Kluver on 03/08/2022 08:04:31 Pooley, [name] ([id number]) -------------------------------------------------------------------------------- HPI Details Patient Name: [name] Date of Service: 03/08/2022 7:30 AM Medical Record Number: [id number] Patient Account Number: [id number] Date of Birth/Sex: 1955-12-14 (65 y.o. M) Treating RN: Cornell Barman Primary Care Provider: [name] Other Clinician: Massie Kluver Referring Provider: [name] Treating Provider/Extender: Skipper Cliche in Treatment: 15 History of Present Illness HPI Description: 05/29/2021 this is a patient who presents today for initial inspection here in the clinic concerning wounds that he has over the right heel and the sacral region. Unfortunately the sacral wound is starting to spread off to the right gluteal location due to how he sits always leaning towards the right side in his chair. His wife is present she is the primary caregiver though she is not home with him all the time she does have to work. She does do an excellent job however it appears to be in trying to keep things under good control for him. The patient is not able to change positions himself nor walk by himself so he is pretty much at the mercy of the position he is putting when she is gone and this tends to be his chair which she sits and most of the day. Obviously this I think is the main culprit for what is going on currently. It was actually in January  2020 when the sacral wound started. It was in September 2022 when the wound started to spread more to the right gluteal location. Subsequently in August 2022 is when he had been in a skilled nursing facility and the heel started to give him trouble as well. That does not seem to be doing nearly as poorly as the sacral  region. He was hospitalized in October 2022 secondary to sepsis and this was in regard to the foot and was sent to skilled nursing again he is now back at home. He did have a wound VAC for the sacral wound over the summer 2022 but being in and out of facilities this ended up getting sent back. The patient does have [facility] home health that comes out 1 time per week to help with care. His most recent hemoglobin A1c was 6.9 in August 2022. Patient's met past medical history includes generalized muscle weakness, bipolar disorder, diabetes mellitus type 2, hypertension, long-term use of anticoagulant therapy due to frequent blood clots/DVTs. He also has a history of traumatic brain injury. 07/24/2021 upon evaluation today patient appears to be doing decently well in regard to the pressure ulcer on the right heel as well as the sacral region. In general I think you are making some progress here which is great news. Overall the heel unfortunately had already closed previously when we saw him although it apparently reopened when he was working with physical therapy according to his wife. The area in the sacral region is doing well and looks clean there is still some depth here but I still think it would be difficult to wound VAC this region. His wife does an awesome job taking care of him. Is been so long since we have seen him because he has been in the hospital to be honest. 08/07/2021 upon evaluation today patient appears to be doing well at this time. Fortunately I do not see any signs of active infection locally or systemically at this time which is great news. No fevers, chills, nausea,  vomiting, or diarrhea. Unfortunately after I saw him on the 24th he actually ended up in the hospital in the 27th due to being septic. This was not due to the wounds but after looking at records actually due to a UTI. Fortunately he is doing much better and very happy in that regard. I do not see any signs of infection locally nor systemically at this time. 08/21/2021 upon evaluation today patient appears to be doing well with regard to his wound. Fortunately I think that the sacral region is doing decently well at this point which is great news. With regard to the foot this also does have some slough noted but I feel like we are making progress here. He does have some irritation around the right upper thigh/gluteal region. I feel like it is more towards the thigh. Nonetheless this does appear to be pressure related he spends a lot of time sitting up his caregiver states he really will not get in the bed and stay there like he should. 09/04/2021 upon evaluation patient appears to be doing decently well today in regard to the wound on his heel as well as the sacral area. I am actually very pleased with both and how things are appearing currently. There does not appear to be any evidence of active infection I think that his caregiver is doing an awesome job with regard to the wound care here. She is present today as well and I did discuss this with her. 09/25/2021 upon evaluation today patient appears to be doing well with regard to his wound on the sacral region. His right heel is also doing well. Unfortunately he has a new area on his left heel which does appear to be pressure injury. I do not see any evidence of active infection locally or systemically which is great news  no fevers, chills, nausea, vomiting, or diarrhea. Readmission: 11-22-2021 upon evaluation today patient presents for reevaluation here in the clinic concerning issues with his wounds. Since have last seen him he was in the hospital and  then subsequently ended up in a skilled nursing facility. Subsequently period of time in the facility he actually had a breakdown in the right thigh location which has been an issue for him. Fortunately I do not see any evidence of active infection locally or systemically which is great news and I am very pleased in that regard. Nonetheless he does have still the wounds on both heels as well as the wound in the sacral area and the new area in the right upper thigh/gluteal region. 12-13-2021 upon evaluation today patient appears to be doing well currently in regard to his wounds. The left heel is going to require some sharp debridement. Fortunately the right heel seems to be doing quite well. Overall I think his gluteal region as well as sacral region also are doing well. 01-10-2022 upon evaluation today patient appears to be doing better in regard to some areas unfortunately his sacral area is not doing as well. It seems of gotten worse his caregiver states when he was in the hospital recently they really did not offloading. Fortunately I do not see any evidence of active infection locally or systemically at this time. 02-01-2022 upon evaluation today patient appears to be doing well currently in regard to his wounds. He is going require some sharp debridement of clearway some of the necrotic debris. Fortunately I do not see any evidence of active infection locally or systemically which is great news. No fevers, chills, nausea, vomiting, or diarrhea. 02-22-2022 upon evaluation today patient appears to be doing poorly in regard to his right heel the left heel is completely closed. Unfortunately I think he is got some deep tissue injury to this area I am going to do a culture at this spot. With that being said he is having 2 other significant issues in regard to the left thigh this is showing signs of cellulitis all the way down into the thigh and was seeing definite temperature difference in the left thigh  versus the right thigh when feeling. It also is of note that the patient is also having some issues here with trouble in regard to having [name], [name] ([id number]) fevers his wife has been giving him Tylenol and ibuprofen to help keep the fevers under control but he has been as high as 102 this is pretty much been going on since Monday or Tuesday of this week. Nonetheless I am concerned I am not exactly sure where the cellulitis on the left thigh is coming from not sure if this is emanating from his sacral region that is a possibility or if there is something completely different going on either way with the fevers I am more worried that he needs to go get this checked out as soon as possible. 03-08-2022 upon evaluation today patient appears to be doing well with regard to his heel as well as sacral region. Both are showing signs of significant improvement compared to the last time I saw him. Overall I think we are in a much better spot Electronic Signature(s) Signed: 03/08/2022 8:34:52 AM By: [name] PA-C Entered By: [name] on 03/08/2022 08:34:52 [name], [name] ([id number]) -------------------------------------------------------------------------------- Physical Exam Details Patient Name: [name] Date of Service: 03/08/2022 7:30 AM Medical Record Number: [id number] Patient Account Number: [id number] Date of Birth/Sex: 1955/08/18 (  66 y.o. M) Treating RN: Cornell Barman Primary Care Provider: [name] Other Clinician: Massie Kluver Referring Provider: [name] Treating Provider/Extender: Skipper Cliche in Treatment: 55 Constitutional Well-nourished and well-hydrated in no acute distress. Respiratory normal breathing without difficulty. Psychiatric this patient is able to make decisions and demonstrates good insight into disease process. Alert and Oriented x 3. pleasant and cooperative. Notes Upon inspection patient's wound bed  actually showed signs of excellent granulation and epithelization in regard to the sacral region I do believe the Hydrofera Blue cut into a spiral and lightly packed would continue to do well here. In regard to the heel I did perform debridement today to clearway some of the necrotic debris tolerated that without complication postdebridement the wound bed appears to be doing significantly better which is great news. Electronic Signature(s) Signed: 03/08/2022 8:35:23 AM By: [name] PA-C Entered By: [name] on 03/08/2022 08:35:23 [name], [name] ([id number]) -------------------------------------------------------------------------------- Physician Orders Details Patient Name: [name] Date of Service: 03/08/2022 7:30 AM Medical Record Number: [id number] Patient Account Number: [id number] Date of Birth/Sex: May 11, 1956 (65 y.o. M) Treating RN: Cornell Barman Primary Care Provider: [name] Other Clinician: Massie Kluver Referring Provider: [name] Treating Provider/Extender: Skipper Cliche in Treatment: 15 Verbal / Phone Orders: No Diagnosis Coding ICD-10 Coding Code Description L89.613 Pressure ulcer of right heel, stage 3 L89.623 Pressure ulcer of left heel, stage 3 L89.154 Pressure ulcer of sacral region, stage 4 L24.A0 Irritant contact dermatitis due to friction or contact with body fluids, unspecified L98.412 Non-pressure chronic ulcer of buttock with fat layer exposed M62.81 Muscle weakness (generalized) F31.9 Bipolar disorder, unspecified E11.622 Type 2 diabetes mellitus with other skin ulcer I10 Essential (primary) hypertension Z79.01 Long term (current) use of anticoagulants 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 Follow-up Appointments o Return Appointment in 3 weeks. [facility] for wound care. May utilize formulary equivalent dressing for wound treatment orders unless otherwise  specified. Home Health Nurse may visit [facility] to address patientos wound care needs. Kathaleen Bury phone [phone] fax [phone] o Scheduled days for dressing changes to be completed; exception, patient has scheduled wound care visit that day. o **Please direct any NON-WOUND related issues/requests for orders to patient's Primary Care Physician. **If current dressing causes regression in wound condition, may D/C ordered dressing product/s and apply Normal Saline Moist Dressing daily until next [facility] or Other MD appointment. **Notify [facility] of regression in wound condition at [phone]. Bathing/ Shower/ Hygiene o May shower with wound dressing protected with water repellent cover or cast protector. o No tub bath. Anesthetic (Use 'Patient Medications' Section for Anesthetic Order Entry) o Lidocaine applied to wound bed Edema Control - Lymphedema / Segmental Compressive Device / Other o Elevate, Exercise Daily and Avoid Standing for Long Periods of Time. o Elevate legs to the level of the heart and pump ankles as often as possible o Elevate leg(s) parallel to the floor when sitting. o DO YOUR BEST to sleep in the bed at night. DO NOT sleep in your recliner. Long hours of sitting in a recliner leads to swelling of the legs and/or potential wounds on your backside. Off-Loading o Gel wheelchair cushion o Low air-loss mattress (Group 2) o Turn and reposition every 2 hours - keep pressure off of the sacrum and heels wounds o Other: - PRAFO boot in bed keep pressure off of sacrum/gluteus and heels- Additional Orders / Instructions o Follow Nutritious Diet and Increase  Protein Intake Wound Treatment Wound #5 - Calcaneus Wound Laterality: Right Salameh, [place] ([id number]) Cleanser: Dakin 16 (oz) 0.25 3 x Per Week/30 Days Discharge Instructions: wet to dry Primary Dressing: Hydrofera Blue Ready Transfer Foam, 2.5x2.5 (in/in) 3 x  Per Week/30 Days Discharge Instructions: Apply Hydrofera Blue Ready to wound bed as directed Secondary Dressing: ABD Pad 5x9 (in/in) 3 x Per Week/30 Days Discharge Instructions: Cover with ABD pad Secured With: Medipore Tape - 60M Medipore H Soft Cloth Surgical Tape, 2x2 (in/yd) 3 x Per Week/30 Days Secured With: Conform 4'' - Conforming Stretch Gauze Bandage 4x75 (in/in) 3 x Per Week/30 Days Discharge Instructions: Apply as directed Wound #7 - Sacrum Cleanser: Dakin 16 (oz) 0.25 1 x Per Day/30 Days Discharge Instructions: Use as directed. Topical: calmoseptine 1 x Per Day/30 Days Discharge Instructions: apply to excorated areas Primary Dressing: Hydrofera Blue Ready Transfer Foam, 4x5 (in/in) 1 x Per Day/30 Days Discharge Instructions: spiral hydrofera blue to lightly pack into wound, then cut to cover wound Secondary Dressing: ABD Pad 5x9 (in/in) 1 x Per Day/30 Days Discharge Instructions: Cover with ABD pad Secured With: Banks H Soft Cloth Surgical Tape, 2x2 (in/yd) 1 x Per Day/30 Days Electronic Signature(s) Signed: 03/08/2022 12:29:30 PM By: [name] PA-C Signed: 03/08/2022 12:49:02 PM By: Massie Kluver Entered By: Massie Kluver on 03/08/2022 08:05:43 [name], [name] ([id number]) -------------------------------------------------------------------------------- Problem List Details Patient Name: [name] Date of Service: 03/08/2022 7:30 AM Medical Record Number: [id number] Patient Account Number: [id number] Date of Birth/Sex: [birth year] (65 y.o. M) Treating RN: Cornell Barman Primary Care Provider: [name] Other Clinician: Massie Kluver Referring Provider: [name] Treating Provider/Extender: Skipper Cliche in Treatment: 15 Active Problems ICD-10 Encounter Code Description Active Date MDM Diagnosis L89.613 Pressure ulcer of right heel, stage 3 11/22/2021 No Yes L89.623 Pressure ulcer of left heel, stage 3 11/22/2021  No Yes L89.154 Pressure ulcer of sacral region, stage 4 11/22/2021 No Yes L24.A0 Irritant contact dermatitis due to friction or contact with body fluids, 11/22/2021 No Yes unspecified L98.412 Non-pressure chronic ulcer of buttock with fat layer exposed 11/22/2021 No Yes M62.81 Muscle weakness (generalized) 11/22/2021 No Yes F31.9 Bipolar disorder, unspecified 11/22/2021 No Yes E11.622 Type 2 diabetes mellitus with other skin ulcer 11/22/2021 No Yes I10 Essential (primary) hypertension 11/22/2021 No Yes Z79.01 Long term (current) use of anticoagulants 11/22/2021 No Yes 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 11/22/2021 No Yes Inactive Problems Resolved Problems [name], [name] ([id number]) Electronic Signature(s) Signed: 03/08/2022 7:47:42 AM By: [name] PA-C Entered By: [name] on 03/08/2022 07:47:42 [name], [name] ([id number]) -------------------------------------------------------------------------------- Progress Note Details Patient Name: [name] Date of Service: 03/08/2022 7:30 AM Medical Record Number: [id number] Patient Account Number: [id number] Date of Birth/Sex: 11/24/1955 (65 y.o. M) Treating RN: Cornell Barman Primary Care Provider: [name] Other Clinician: Massie Kluver Referring Provider: [name] Treating Provider/Extender: Skipper Cliche in Treatment: 15 Subjective Chief Complaint Information obtained from Patient Sacral, right gluteal, and bilateral heel ulcers History of Present Illness (HPI) 05/29/2021 this is a patient who presents today for initial inspection here in the clinic concerning wounds that he has over the right heel and the sacral region. Unfortunately the sacral wound is starting to spread off to the right gluteal location due to how he sits always leaning towards the right side in his chair. His wife is present she is the primary caregiver though she is not home with him all the time she  does have to work.  She does do an excellent job however it appears to be in trying to keep things under good control for him. The patient is not able to change positions himself nor walk by himself so he is pretty much at the mercy of the position he is putting when she is gone and this tends to be his chair which she sits and most of the day. Obviously this I think is the main culprit for what is going on currently. It was actually in January 2020 when the sacral wound started. It was in September 2022 when the wound started to spread more to the right gluteal location. Subsequently in August 2022 is when he had been in a skilled nursing facility and the heel started to give him trouble as well. That does not seem to be doing nearly as poorly as the sacral region. He was hospitalized in October 2022 secondary to sepsis and this was in regard to the foot and was sent to skilled nursing again he is now back at home. He did have a wound VAC for the sacral wound over the summer 2022 but being in and out of facilities this ended up getting sent back. The patient does have [facility] home health that comes out 1 time per week to help with care. His most recent hemoglobin A1c was 6.9 in August 2022. Patient's met past medical history includes generalized muscle weakness, bipolar disorder, diabetes mellitus type 2, hypertension, long-term use of anticoagulant therapy due to frequent blood clots/DVTs. He also has a history of traumatic brain injury. 07/24/2021 upon evaluation today patient appears to be doing decently well in regard to the pressure ulcer on the right heel as well as the sacral region. In general I think you are making some progress here which is great news. Overall the heel unfortunately had already closed previously when we saw him although it apparently reopened when he was working with physical therapy according to his wife. The area in the sacral region is doing well and looks clean there is  still some depth here but I still think it would be difficult to wound VAC this region. His wife does an awesome job taking care of him. Is been so long since we have seen him because he has been in the hospital to be honest. 08/07/2021 upon evaluation today patient appears to be doing well at this time. Fortunately I do not see any signs of active infection locally or systemically at this time which is great news. No fevers, chills, nausea, vomiting, or diarrhea. Unfortunately after I saw him on the 24th he actually ended up in the hospital in the 27th due to being septic. This was not due to the wounds but after looking at records actually due to a UTI. Fortunately he is doing much better and very happy in that regard. I do not see any signs of infection locally nor systemically at this time. 08/21/2021 upon evaluation today patient appears to be doing well with regard to his wound. Fortunately I think that the sacral region is doing decently well at this point which is great news. With regard to the foot this also does have some slough noted but I feel like we are making progress here. He does have some irritation around the right upper thigh/gluteal region. I feel like it is more towards the thigh. Nonetheless this does appear to be pressure related he spends a lot of time sitting up his caregiver states he really will not get in  the bed and stay there like he should. 09/04/2021 upon evaluation patient appears to be doing decently well today in regard to the wound on his heel as well as the sacral area. I am actually very pleased with both and how things are appearing currently. There does not appear to be any evidence of active infection I think that his caregiver is doing an awesome job with regard to the wound care here. She is present today as well and I did discuss this with her. 09/25/2021 upon evaluation today patient appears to be doing well with regard to his wound on the sacral region. His  right heel is also doing well. Unfortunately he has a new area on his left heel which does appear to be pressure injury. I do not see any evidence of active infection locally or systemically which is great news no fevers, chills, nausea, vomiting, or diarrhea. Readmission: 11-22-2021 upon evaluation today patient presents for reevaluation here in the clinic concerning issues with his wounds. Since have last seen him he was in the hospital and then subsequently ended up in a skilled nursing facility. Subsequently period of time in the facility he actually had a breakdown in the right thigh location which has been an issue for him. Fortunately I do not see any evidence of active infection locally or systemically which is great news and I am very pleased in that regard. Nonetheless he does have still the wounds on both heels as well as the wound in the sacral area and the new area in the right upper thigh/gluteal region. 12-13-2021 upon evaluation today patient appears to be doing well currently in regard to his wounds. The left heel is going to require some sharp debridement. Fortunately the right heel seems to be doing quite well. Overall I think his gluteal region as well as sacral region also are doing well. 01-10-2022 upon evaluation today patient appears to be doing better in regard to some areas unfortunately his sacral area is not doing as well. It seems of gotten worse his caregiver states when he was in the hospital recently they really did not offloading. Fortunately I do not see any evidence of active infection locally or systemically at this time. 02-01-2022 upon evaluation today patient appears to be doing well currently in regard to his wounds. He is going require some sharp debridement of clearway some of the necrotic debris. Fortunately I do not see any evidence of active infection locally or systemically which is great news. No fevers, chills, nausea, vomiting, or diarrhea. [name], [name] ([id number]) 02-22-2022 upon evaluation today patient appears to be doing poorly in regard to his right heel the left heel is completely closed. Unfortunately I think he is got some deep tissue injury to this area I am going to do a culture at this spot. With that being said he is having 2 other significant issues in regard to the left thigh this is showing signs of cellulitis all the way down into the thigh and was seeing definite temperature difference in the left thigh versus the right thigh when feeling. It also is of note that the patient is also having some issues here with trouble in regard to having fevers his wife has been giving him Tylenol and ibuprofen to help keep the fevers under control but he has been as high as 102 this is pretty much been going on since Monday or Tuesday of this week. Nonetheless I am concerned I am not exactly sure where the  cellulitis on the left thigh is coming from not sure if this is emanating from his sacral region that is a possibility or if there is something completely different going on either way with the fevers I am more worried that he needs to go get this checked out as soon as possible. 03-08-2022 upon evaluation today patient appears to be doing well with regard to his heel as well as sacral region. Both are showing signs of significant improvement compared to the last time I saw him. Overall I think we are in a much better spot Objective Constitutional Well-nourished and well-hydrated in no acute distress. Vitals Time Taken: 7:40 AM, Height: 66 in, Weight: 279 lbs, BMI: 45, Temperature: 97.8 F, Pulse: 93 bpm, Respiratory Rate: 18 breaths/min, Blood Pressure: 176/93 mmHg. General Notes: Patient has not taken medication this morning Respiratory normal breathing without difficulty. Psychiatric this patient is able to make decisions and demonstrates good insight into disease process. Alert and Oriented x 3. pleasant and cooperative. General  Notes: Upon inspection patient's wound bed actually showed signs of excellent granulation and epithelization in regard to the sacral region I do believe the Hydrofera Blue cut into a spiral and lightly packed would continue to do well here. In regard to the heel I did perform debridement today to clearway some of the necrotic debris tolerated that without complication postdebridement the wound bed appears to be doing significantly better which is great news. Integumentary (Hair, Skin) Wound #5 status is Open. Original cause of wound was Pressure Injury. The date acquired was: 07/01/2021. The wound has been in treatment 15 weeks. The wound is located on the Right Calcaneus. The wound measures 3.2cm length x 3.5cm width x 0.4cm depth; 8.796cm^2 area and 3.519cm^3 volume. There is Fat Layer (Subcutaneous Tissue) exposed. There is a medium amount of serosanguineous drainage noted. There is large (67-100%) red, pink granulation within the wound bed. There is a small (1-33%) amount of necrotic tissue within the wound bed including Adherent Slough. Wound #7 status is Open. Original cause of wound was Pressure Injury. The date acquired was: 07/01/2021. The wound has been in treatment 15 weeks. The wound is located on the Sacrum. The wound measures 2cm length x 1cm width x 4.1cm depth; 1.571cm^2 area and 6.44cm^3 volume. There is Fat Layer (Subcutaneous Tissue) exposed. There is a medium amount of serosanguineous drainage noted. There is large (67- 100%) red granulation within the wound bed. There is no necrotic tissue within the wound bed. Assessment Active Problems ICD-10 Pressure ulcer of right heel, stage 3 Pressure ulcer of left heel, stage 3 Pressure ulcer of sacral region, stage 4 Irritant contact dermatitis due to friction or contact with body fluids, unspecified Non-pressure chronic ulcer of buttock with fat layer exposed Muscle weakness (generalized) Bipolar disorder, unspecified Type 2 diabetes  mellitus with other skin ulcer Essential (primary) hypertension Long term (current) use of anticoagulants Personal history of traumatic brain injury [name], [name] ([id number]) Procedures Wound #5 Pre-procedure diagnosis of Wound #5 is a Pressure Ulcer located on the Right Calcaneus . There was a Excisional Skin/Subcutaneous Tissue Debridement with a total area of 11.2 sq cm performed by Cornell Barman, RN. With the following instrument(s): Curette to remove Viable and Non- Viable tissue/material. Material removed includes Callus, Subcutaneous Tissue, and Slough. A time out was conducted at 08:01, prior to the start of the procedure. A Minimum amount of bleeding was controlled with Pressure. The procedure was tolerated well. Post Debridement Measurements: 3.2cm length x 3.5cm width x  0.5cm depth; 4.398cm^3 volume. Post debridement Stage noted as Category/Stage III. Character of Wound/Ulcer Post Debridement is stable. Post procedure Diagnosis Wound #5: Same as Pre-Procedure Plan Follow-up Appointments: Return Appointment in 3 weeks. Home Health: [facility] for wound care. May utilize formulary equivalent dressing for wound treatment orders unless otherwise specified. Home Health Nurse may visit PRN to address patient s wound care needs. Kathaleen Bury phone [phone] fax [phone] Scheduled days for dressing changes to be completed; exception, patient has scheduled wound care visit that day. **Please direct any NON-WOUND related issues/requests for orders to patient's Primary Care Physician. **If current dressing causes regression in wound condition, may D/C ordered dressing product/s and apply Normal Saline Moist Dressing daily until next [place] or Other MD appointment. **Notify [facility] of regression in wound condition at [phone]. Bathing/ Shower/ Hygiene: May shower with wound dressing protected with water repellent cover or cast  protector. No tub bath. Anesthetic (Use 'Patient Medications' Section for Anesthetic Order Entry): Lidocaine applied to wound bed Edema Control - Lymphedema / Segmental Compressive Device / Other: Elevate, Exercise Daily and Avoid Standing for Long Periods of Time. Elevate legs to the level of the heart and pump ankles as often as possible Elevate leg(s) parallel to the floor when sitting. DO YOUR BEST to sleep in the bed at night. DO NOT sleep in your recliner. Long hours of sitting in a recliner leads to swelling of the legs and/or potential wounds on your backside. Off-Loading: Gel wheelchair cushion Low air-loss mattress (Group 2) Turn and reposition every 2 hours - keep pressure off of the sacrum and heels wounds Other: - PRAFO boot in bed keep pressure off of sacrum/gluteus and heels- Additional Orders / Instructions: Follow Nutritious Diet and Increase Protein Intake WOUND #5: - Calcaneus Wound Laterality: Right Cleanser: Dakin 16 (oz) 0.25 3 x Per Week/30 Days Discharge Instructions: wet to dry Primary Dressing: Hydrofera Blue Ready Transfer Foam, 2.5x2.5 (in/in) 3 x Per Week/30 Days Discharge Instructions: Apply Hydrofera Blue Ready to wound bed as directed Secondary Dressing: ABD Pad 5x9 (in/in) 3 x Per Week/30 Days Discharge Instructions: Cover with ABD pad Secured With: Medipore Tape - 80M Medipore H Soft Cloth Surgical Tape, 2x2 (in/yd) 3 x Per Week/30 Days Secured With: Conform 4'' - Conforming Stretch Gauze Bandage 4x75 (in/in) 3 x Per Week/30 Days Discharge Instructions: Apply as directed WOUND #7: - Sacrum Wound Laterality: Cleanser: Dakin 16 (oz) 0.25 1 x Per Day/30 Days Discharge Instructions: Use as directed. Topical: calmoseptine 1 x Per Day/30 Days Discharge Instructions: apply to excorated areas Primary Dressing: Hydrofera Blue Ready Transfer Foam, 4x5 (in/in) 1 x Per Day/30 Days Discharge Instructions: spiral hydrofera blue to lightly pack into wound, then  cut to cover wound Secondary Dressing: ABD Pad 5x9 (in/in) 1 x Per Day/30 Days Discharge Instructions: Cover with ABD pad Secured With: Medipore Tape - 80M Medipore H Soft Cloth Surgical Tape, 2x2 (in/yd) 1 x Per Day/30 Days 1. I would recommend that we continue with [facility] for both locations I think this should do well for him. 2. Also can recommend an ABD pad to cover secured with tape in regard to the heel and then using a bordered foam dressing in regard to the sacral area. 3. Still we need to be very focused and meticulous about appropriate offloading the patient and his wife are both aware of this that is what got [place] last week as he was having a lot of pressure injury to  the wounds which caused him to not look as good. [name], [name] ([id number]) We will see patient back for reevaluation in 3 weeks here in the clinic. If anything worsens or changes patient will contact our office for additional recommendations. Electronic Signature(s) Signed: 03/08/2022 8:36:07 AM By: [name] PA-C Entered By: [name] on 03/08/2022 08:36:07 [name], [name] ([id number]) -------------------------------------------------------------------------------- SuperBill Details Patient Name: [name] Date of Service: 03/08/2022 Medical Record Number: [id number] Patient Account Number: [id number] Date of Birth/Sex: 1956/06/21 (65 y.o. M) Treating RN: Cornell Barman Primary Care Provider: [name] Other Clinician: Massie Kluver Referring Provider: [name] Treating Provider/Extender: Skipper Cliche in Treatment: 15 Diagnosis Coding ICD-10 Codes Code Description [phone] Pressure ulcer of right heel, stage 3 L89.623 Pressure ulcer of left heel, stage 3 L89.154 Pressure ulcer of sacral region, stage 4 L24.A0 Irritant contact dermatitis due to friction or contact with body fluids, unspecified L98.412 Non-pressure chronic ulcer of buttock with fat  layer exposed M62.81 Muscle weakness (generalized) F31.9 Bipolar disorder, unspecified E11.622 Type 2 diabetes mellitus with other skin ulcer I10 Essential (primary) hypertension Z79.01 Long term (current) use of anticoagulants 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 Facility Procedures CPT4 Code: [id number] Description: 11042 - DEB SUBQ TISSUE 20 SQ CM/< Modifier: Quantity: 1 CPT4 Code: Description: ICD-10 Diagnosis Description L89.613 Pressure ulcer of right heel, stage 3 Modifier: Quantity: Physician Procedures CPT4 Code: 8413244 Description: 01027 - WC PHYS SUBQ TISS 20 SQ CM Modifier: Quantity: 1 CPT4 Code: Description: ICD-10 Diagnosis Description O53.664 Pressure ulcer of right heel, stage 3 Modifier: Quantity: Electronic Signature(s) Signed: 03/08/2022 8:36:29 AM By: [name] PA-C Entered By: [name] on 03/08/2022 08:36:29

## 2022-03-20 ENCOUNTER — Other Ambulatory Visit: Payer: Self-pay

## 2022-03-20 ENCOUNTER — Inpatient Hospital Stay
Admission: EM | Admit: 2022-03-20 | Discharge: 2022-03-23 | DRG: 698 | Disposition: A | Discharge status: Home Health | Payer: Medicare Other | Attending: Internal Medicine | Admitting: Internal Medicine

## 2022-03-20 ENCOUNTER — Emergency Department: Payer: Medicare Other

## 2022-03-20 ENCOUNTER — Encounter: Payer: Self-pay | Admitting: Emergency Medicine

## 2022-03-20 DIAGNOSIS — A4159 Other Gram-negative sepsis: Secondary | ICD-10-CM | POA: Diagnosis present

## 2022-03-20 DIAGNOSIS — T83511A Infection and inflammatory reaction due to indwelling urethral catheter, initial encounter: Principal | ICD-10-CM | POA: Diagnosis present

## 2022-03-20 DIAGNOSIS — A4150 Gram-negative sepsis, unspecified: Secondary | ICD-10-CM | POA: Diagnosis not present

## 2022-03-20 DIAGNOSIS — E871 Hypo-osmolality and hyponatremia: Secondary | ICD-10-CM | POA: Diagnosis present

## 2022-03-20 DIAGNOSIS — Z933 Colostomy status: Secondary | ICD-10-CM | POA: Diagnosis not present

## 2022-03-20 DIAGNOSIS — E119 Type 2 diabetes mellitus without complications: Secondary | ICD-10-CM

## 2022-03-20 DIAGNOSIS — I129 Hypertensive chronic kidney disease with stage 1 through stage 4 chronic kidney disease, or unspecified chronic kidney disease: Secondary | ICD-10-CM | POA: Diagnosis present

## 2022-03-20 DIAGNOSIS — D631 Anemia in chronic kidney disease: Secondary | ICD-10-CM | POA: Diagnosis present

## 2022-03-20 DIAGNOSIS — J9811 Atelectasis: Secondary | ICD-10-CM | POA: Diagnosis present

## 2022-03-20 DIAGNOSIS — A419 Sepsis, unspecified organism: Secondary | ICD-10-CM | POA: Diagnosis not present

## 2022-03-20 DIAGNOSIS — Z7984 Long term (current) use of oral hypoglycemic drugs: Secondary | ICD-10-CM | POA: Diagnosis not present

## 2022-03-20 DIAGNOSIS — I10 Essential (primary) hypertension: Secondary | ICD-10-CM | POA: Diagnosis present

## 2022-03-20 DIAGNOSIS — Z818 Family history of other mental and behavioral disorders: Secondary | ICD-10-CM | POA: Diagnosis not present

## 2022-03-20 DIAGNOSIS — Z87820 Personal history of traumatic brain injury: Secondary | ICD-10-CM | POA: Diagnosis not present

## 2022-03-20 DIAGNOSIS — N189 Chronic kidney disease, unspecified: Secondary | ICD-10-CM | POA: Diagnosis not present

## 2022-03-20 DIAGNOSIS — Z20822 Contact with and (suspected) exposure to covid-19: Secondary | ICD-10-CM | POA: Diagnosis present

## 2022-03-20 DIAGNOSIS — E785 Hyperlipidemia, unspecified: Secondary | ICD-10-CM | POA: Diagnosis present

## 2022-03-20 DIAGNOSIS — R6520 Severe sepsis without septic shock: Secondary | ICD-10-CM | POA: Diagnosis present

## 2022-03-20 DIAGNOSIS — Z79899 Other long term (current) drug therapy: Secondary | ICD-10-CM | POA: Diagnosis not present

## 2022-03-20 DIAGNOSIS — Z7901 Long term (current) use of anticoagulants: Secondary | ICD-10-CM | POA: Diagnosis not present

## 2022-03-20 DIAGNOSIS — N179 Acute kidney failure, unspecified: Secondary | ICD-10-CM | POA: Diagnosis present

## 2022-03-20 DIAGNOSIS — Y846 Urinary catheterization as the cause of abnormal reaction of the patient, or of later complication, without mention of misadventure at the time of the procedure: Secondary | ICD-10-CM | POA: Diagnosis present

## 2022-03-20 DIAGNOSIS — Z6836 Body mass index (BMI) 36.0-36.9, adult: Secondary | ICD-10-CM | POA: Diagnosis not present

## 2022-03-20 DIAGNOSIS — E1122 Type 2 diabetes mellitus with diabetic chronic kidney disease: Secondary | ICD-10-CM | POA: Diagnosis present

## 2022-03-20 DIAGNOSIS — F3160 Bipolar disorder, current episode mixed, unspecified: Secondary | ICD-10-CM | POA: Diagnosis present

## 2022-03-20 DIAGNOSIS — N1831 Chronic kidney disease, stage 3a: Secondary | ICD-10-CM | POA: Diagnosis present

## 2022-03-20 DIAGNOSIS — N390 Urinary tract infection, site not specified: Secondary | ICD-10-CM | POA: Diagnosis present

## 2022-03-20 DIAGNOSIS — E669 Obesity, unspecified: Secondary | ICD-10-CM | POA: Diagnosis present

## 2022-03-20 DIAGNOSIS — Z86718 Personal history of other venous thrombosis and embolism: Secondary | ICD-10-CM

## 2022-03-20 DIAGNOSIS — S069XAA Unspecified intracranial injury with loss of consciousness status unknown, initial encounter: Secondary | ICD-10-CM | POA: Diagnosis present

## 2022-03-20 LAB — CBC WITH DIFFERENTIAL/PLATELET
Abs Immature Granulocytes: 0.01 10*3/uL (ref 0.00–0.07)
Basophils Absolute: 0 10*3/uL (ref 0.0–0.1)
Basophils Relative: 0 %
Eosinophils Absolute: 0.1 10*3/uL (ref 0.0–0.5)
Eosinophils Relative: 1 %
HCT: 30.7 % — ABNORMAL LOW (ref 39.0–52.0)
Hemoglobin: 8.9 g/dL — ABNORMAL LOW (ref 13.0–17.0)
Immature Granulocytes: 0 %
Lymphocytes Relative: 11 %
Lymphs Abs: 0.8 10*3/uL (ref 0.7–4.0)
MCH: 24.4 pg — ABNORMAL LOW (ref 26.0–34.0)
MCHC: 29 g/dL — ABNORMAL LOW (ref 30.0–36.0)
MCV: 84.1 fL (ref 80.0–100.0)
Monocytes Absolute: 0.8 10*3/uL (ref 0.1–1.0)
Monocytes Relative: 11 %
Neutro Abs: 5.5 10*3/uL (ref 1.7–7.7)
Neutrophils Relative %: 77 %
Platelets: 219 10*3/uL (ref 150–400)
RBC: 3.65 MIL/uL — ABNORMAL LOW (ref 4.22–5.81)
RDW: 18.5 % — ABNORMAL HIGH (ref 11.5–15.5)
WBC: 7.2 10*3/uL (ref 4.0–10.5)
nRBC: 0 % (ref 0.0–0.2)

## 2022-03-20 LAB — URINALYSIS, ROUTINE W REFLEX MICROSCOPIC
Bilirubin Urine: NEGATIVE
Glucose, UA: NEGATIVE mg/dL
Ketones, ur: NEGATIVE mg/dL
Nitrite: POSITIVE — AB
Protein, ur: >300 mg/dL — AB
Specific Gravity, Urine: 1.015 (ref 1.005–1.030)
pH: 6 (ref 5.0–8.0)

## 2022-03-20 LAB — COMPREHENSIVE METABOLIC PANEL
ALT: 14 U/L (ref 0–44)
AST: 21 U/L (ref 15–41)
Albumin: 3.1 g/dL — ABNORMAL LOW (ref 3.5–5.0)
Alkaline Phosphatase: 111 U/L (ref 38–126)
Anion gap: 8 (ref 5–15)
BUN: 33 mg/dL — ABNORMAL HIGH (ref 8–23)
CO2: 18 mmol/L — ABNORMAL LOW (ref 22–32)
Calcium: 8.4 mg/dL — ABNORMAL LOW (ref 8.9–10.3)
Chloride: 107 mmol/L (ref 98–111)
Creatinine, Ser: 2.1 mg/dL — ABNORMAL HIGH (ref 0.61–1.24)
GFR, Estimated: 34 mL/min — ABNORMAL LOW (ref >60)
Glucose, Bld: 214 mg/dL — ABNORMAL HIGH (ref 70–99)
Potassium: 4.5 mmol/L (ref 3.5–5.1)
Sodium: 133 mmol/L — ABNORMAL LOW (ref 135–145)
Total Bilirubin: 0.5 mg/dL (ref 0.3–1.2)
Total Protein: 8.4 g/dL — ABNORMAL HIGH (ref 6.5–8.1)

## 2022-03-20 LAB — URINALYSIS, MICROSCOPIC (REFLEX): WBC, UA: >50 WBC/hpf (ref 0–5)

## 2022-03-20 LAB — LACTIC ACID, PLASMA
Lactic Acid, Venous: 3.7 mmol/L (ref 0.5–1.9)
Lactic Acid, Venous: 1.3 mmol/L (ref 0.5–1.9)

## 2022-03-20 LAB — PROTIME-INR
INR: 1.4 — ABNORMAL HIGH (ref 0.8–1.2)
Prothrombin Time: 17.1 seconds — ABNORMAL HIGH (ref 11.4–15.2)

## 2022-03-20 LAB — SARS CORONAVIRUS 2 BY RT PCR: SARS Coronavirus 2 by RT PCR: NEGATIVE

## 2022-03-20 MED ORDER — VITAMIN B-12 1000 MCG PO TABS
3000.0000 ug | ORAL_TABLET | Freq: Every day | ORAL | Status: DC
  Administered 2022-03-21 – 2022-03-23 (×3): 3000 ug via ORAL
  Filled 2022-03-20 (×3): qty 3

## 2022-03-20 MED ORDER — QUETIAPINE FUMARATE 25 MG PO TABS
100.0000 mg | ORAL_TABLET | Freq: Every day | ORAL | Status: DC
  Administered 2022-03-21 – 2022-03-22 (×2): 100 mg via ORAL
  Filled 2022-03-20 (×3): qty 4

## 2022-03-20 MED ORDER — ROSUVASTATIN CALCIUM 10 MG PO TABS
20.0000 mg | ORAL_TABLET | Freq: Every day | ORAL | Status: DC
  Administered 2022-03-21 – 2022-03-23 (×3): 20 mg via ORAL
  Filled 2022-03-20: qty 2
  Filled 2022-03-20: qty 1
  Filled 2022-03-20: qty 2

## 2022-03-20 MED ORDER — OMEGA-3-ACID ETHYL ESTERS 1 G PO CAPS
1.0000 | ORAL_CAPSULE | Freq: Every day | ORAL | Status: DC
  Administered 2022-03-21 – 2022-03-23 (×3): 1 g via ORAL
  Filled 2022-03-20 (×3): qty 1

## 2022-03-20 MED ORDER — ACETAMINOPHEN 650 MG RE SUPP: 650.0000 mg | Freq: Four times a day (QID) | RECTAL | Status: DC | PRN

## 2022-03-20 MED ORDER — SODIUM CHLORIDE 0.9 % IV BOLUS
1000.0000 mL | Freq: Once | INTRAVENOUS | Status: AC
  Administered 2022-03-20: 1000 mL via INTRAVENOUS

## 2022-03-20 MED ORDER — VANCOMYCIN HCL IN DEXTROSE 1-5 GM/200ML-% IV SOLN
1000.0000 mg | Freq: Once | INTRAVENOUS | Status: AC
  Administered 2022-03-20: 1000 mg via INTRAVENOUS
  Filled 2022-03-20: qty 200

## 2022-03-20 MED ORDER — MAGNESIUM HYDROXIDE 400 MG/5ML PO SUSP: 30.0000 mL | Freq: Every day | ORAL | Status: DC | PRN

## 2022-03-20 MED ORDER — SODIUM CHLORIDE 0.9 % IV SOLN
2.0000 g | Freq: Once | INTRAVENOUS | Status: AC
  Administered 2022-03-20: 2 g via INTRAVENOUS
  Filled 2022-03-20: qty 12.5

## 2022-03-20 MED ORDER — SODIUM CHLORIDE 0.9 % IV SOLN: INTRAVENOUS | Status: DC

## 2022-03-20 MED ORDER — VITAMIN D 25 MCG (1000 UNIT) PO TABS
1000.0000 [IU] | ORAL_TABLET | Freq: Every day | ORAL | Status: DC
  Administered 2022-03-21 – 2022-03-23 (×3): 1000 [IU] via ORAL
  Filled 2022-03-20 (×3): qty 1

## 2022-03-20 MED ORDER — MEMANTINE HCL 5 MG PO TABS
10.0000 mg | ORAL_TABLET | Freq: Two times a day (BID) | ORAL | Status: DC
  Administered 2022-03-21 – 2022-03-23 (×5): 10 mg via ORAL
  Filled 2022-03-20 (×5): qty 2

## 2022-03-20 MED ORDER — SODIUM CHLORIDE 0.9 % IV SOLN
2.0000 g | INTRAVENOUS | Status: DC
  Administered 2022-03-21 – 2022-03-23 (×3): 2 g via INTRAVENOUS
  Filled 2022-03-20: qty 2
  Filled 2022-03-20: qty 20
  Filled 2022-03-20: qty 2

## 2022-03-20 MED ORDER — METRONIDAZOLE 500 MG/100ML IV SOLN
500.0000 mg | Freq: Once | INTRAVENOUS | Status: AC
  Administered 2022-03-20: 500 mg via INTRAVENOUS

## 2022-03-20 MED ORDER — ACETAMINOPHEN 325 MG PO TABS: 650.0000 mg | ORAL_TABLET | Freq: Four times a day (QID) | ORAL | Status: DC | PRN

## 2022-03-20 MED ORDER — ONDANSETRON HCL 4 MG PO TABS: 4.0000 mg | ORAL_TABLET | Freq: Four times a day (QID) | ORAL | Status: DC | PRN

## 2022-03-20 MED ORDER — TRAZODONE HCL 50 MG PO TABS
25.0000 mg | ORAL_TABLET | Freq: Every evening | ORAL | Status: DC | PRN
  Administered 2022-03-22: 25 mg via ORAL
  Filled 2022-03-20 (×2): qty 1

## 2022-03-20 MED ORDER — LACTATED RINGERS IV BOLUS
1000.0000 mL | Freq: Once | INTRAVENOUS | Status: AC
  Administered 2022-03-20: 1000 mL via INTRAVENOUS

## 2022-03-20 MED ORDER — APIXABAN 5 MG PO TABS
5.0000 mg | ORAL_TABLET | Freq: Two times a day (BID) | ORAL | Status: DC
  Administered 2022-03-21 – 2022-03-23 (×5): 5 mg via ORAL
  Filled 2022-03-20 (×5): qty 1

## 2022-03-20 MED ORDER — VITAMIN C 500 MG PO TABS
500.0000 mg | ORAL_TABLET | Freq: Every day | ORAL | Status: DC
  Administered 2022-03-21 – 2022-03-23 (×3): 500 mg via ORAL
  Filled 2022-03-20 (×3): qty 1

## 2022-03-20 MED ORDER — GLIPIZIDE ER 2.5 MG PO TB24
2.5000 mg | ORAL_TABLET | Freq: Every day | ORAL | Status: DC
  Administered 2022-03-21: 2.5 mg via ORAL
  Filled 2022-03-20: qty 1

## 2022-03-20 MED ORDER — ACETAMINOPHEN 500 MG PO TABS
1000.0000 mg | ORAL_TABLET | Freq: Once | ORAL | Status: AC
  Administered 2022-03-20: 1000 mg via ORAL
  Filled 2022-03-20: qty 2

## 2022-03-20 MED ORDER — OXCARBAZEPINE 300 MG PO TABS
300.0000 mg | ORAL_TABLET | Freq: Two times a day (BID) | ORAL | Status: DC
  Administered 2022-03-21 – 2022-03-23 (×5): 300 mg via ORAL
  Filled 2022-03-20 (×5): qty 1

## 2022-03-20 MED ORDER — ONDANSETRON HCL 4 MG/2ML IJ SOLN: 4.0000 mg | Freq: Four times a day (QID) | INTRAMUSCULAR | Status: DC | PRN

## 2022-03-20 NOTE — Assessment & Plan Note (Signed)
-   We will continue Seroquel. ?

## 2022-03-20 NOTE — Assessment & Plan Note (Addendum)
-   This is manifested by elevated lactic acid, initial hypotension and AKI. - Management as above.

## 2022-03-20 NOTE — Assessment & Plan Note (Signed)
-   This could be related to mild volume depletion. - We will continue hydration with IV normal saline and follow BMP. - We will avoid nephrotoxins.

## 2022-03-20 NOTE — Assessment & Plan Note (Signed)
-   We will continue Eliquis. 

## 2022-03-20 NOTE — Assessment & Plan Note (Addendum)
-   The patient be admitted to a medical telemetry bed. - Sepsis manifested by fever, heart rate of 99 and later respiratory rate of 23. - We will continue antibiotic therapy with IV Rocephin. - We will continue hydration with IV normal saline. - We will follow blood and urine cultures.

## 2022-03-20 NOTE — ED Triage Notes (Signed)
Pt bib ems was getting out of car and fell to the ground, denies any pain, wife states to ems that pt was not acting himself. Pt has chronic cath and gets frequent UTI. Pt febrile 101.7 axillary

## 2022-03-20 NOTE — Assessment & Plan Note (Signed)
-   We will continue statin therapy. 

## 2022-03-20 NOTE — Assessment & Plan Note (Signed)
-   We will continue Norvasc with continued improvement of his blood pressure

## 2022-03-20 NOTE — Assessment & Plan Note (Signed)
-   We will continue his Namenda.

## 2022-03-20 NOTE — ED Provider Notes (Signed)
[facility] Provider Note    Event Date/Time   First MD Initiated Contact with Patient 03/20/22 1756     (approximate)   History   Fever and Fall   HPI  [name] is a 66 y.o. male  who presents to the emergency department today after a fall. The patient states he was getting out of his car when he fell. Denies any injuries with the fall. States that he had been in his normal state of health earlier in the day. The patient had been to his doctors office for a scheduled appointment earlier in the day. The patient denied any chest pain or shortness of breath prior to the fall.        Physical Exam   Triage Vital Signs: ED Triage Vitals  Enc Vitals Group     BP 03/20/22 1747 (!) 83/42     Pulse Rate 03/20/22 1747 99     Resp 03/20/22 1747 16     Temp 03/20/22 1747 (!) 101.7 F (38.7 C)     Temp Source 03/20/22 1747 Axillary     SpO2 03/20/22 1747 97 %     Weight 03/20/22 1748 229 lb (103.9 kg)     Height 03/20/22 1748 5\' 6"  (1.676 m)     Head Circumference --      Peak Flow --      Pain Score 03/20/22 1748 0     Pain Loc --      Pain Edu? --      Excl. in [place]? --     Most recent vital signs: Vitals:   03/20/22 1747  BP: (!) 83/42  Pulse: 99  Resp: 16  Temp: (!) 101.7 F (38.7 C)  SpO2: 97%    General: Awake, alert, oriented.  CV:  Good peripheral perfusion. Regular rate and rhythm. Resp:  Normal effort. Lungs clear. Abd:  No distention.  Other:  Foley catheter in place.    ED Results / Procedures / Treatments   Labs (all labs ordered are listed, but only abnormal results are displayed) Labs Reviewed  COMPREHENSIVE METABOLIC PANEL - Abnormal; Notable for the following components:      Result Value   Sodium 133 (*)    CO2 18 (*)    Glucose, Bld 214 (*)    BUN 33 (*)    Creatinine, Ser 2.10 (*)    Calcium 8.4 (*)    Total Protein 8.4 (*)    Albumin 3.1 (*)    GFR, Estimated 34 (*)    All other components within  normal limits  LACTIC ACID, PLASMA - Abnormal; Notable for the following components:   Lactic Acid, Venous 3.7 (*)    All other components within normal limits  CBC WITH DIFFERENTIAL/PLATELET - Abnormal; Notable for the following components:   RBC 3.65 (*)    Hemoglobin 8.9 (*)    HCT 30.7 (*)    MCH 24.4 (*)    MCHC 29.0 (*)    RDW 18.5 (*)    All other components within normal limits  URINALYSIS, ROUTINE W REFLEX MICROSCOPIC - Abnormal; Notable for the following components:   Hgb urine dipstick MODERATE (*)    Protein, ur >300 (*)    Nitrite POSITIVE (*)    Leukocytes,Ua SMALL (*)    All other components within normal limits  PROTIME-INR - Abnormal; Notable for the following components:   Prothrombin Time 17.1 (*)    INR 1.4 (*)  All other components within normal limits  URINALYSIS, MICROSCOPIC (REFLEX) - Abnormal; Notable for the following components:   Bacteria, UA RARE (*)    All other components within normal limits  SARS CORONAVIRUS 2 BY RT PCR  CULTURE, BLOOD (ROUTINE X 2)  CULTURE, BLOOD (ROUTINE X 2)  LACTIC ACID, PLASMA     EKG  I, [name], attending physician, personally viewed and interpreted this EKG  EKG Time: 1749 Rate: 97 Rhythm: sinus rhythm Axis: normal Intervals: qtc 387 QRS: narrow ST changes: no st elevation Impression: normal ekg   RADIOLOGY I independently interpreted and visualized the CXR. My interpretation: Opacities  Radiology interpretation:  IMPRESSION:  Mild vascular congestion without edema    Mild bibasilar atelectasis.     PROCEDURES:  Critical Care performed: Yes  Procedures  CRITICAL CARE Performed by: [name]   Total critical care time: 30 minutes  Critical care time was exclusive of separately billable procedures and treating other patients.  Critical care was necessary to treat or prevent imminent or life-threatening deterioration.  Critical care was time spent personally by me on the  following activities: development of treatment plan with patient and/or surrogate as well as nursing, discussions with consultants, evaluation of patient's response to treatment, examination of patient, obtaining history from patient or surrogate, ordering and performing treatments and interventions, ordering and review of laboratory studies, ordering and review of radiographic studies, pulse oximetry and re-evaluation of patient's condition.   MEDICATIONS ORDERED IN ED: Medications  vancomycin (VANCOCIN) IVPB 1000 mg/200 mL premix (1,000 mg Intravenous New Bag/Given 03/20/22 2003)  lactated ringers bolus 1,000 mL (1,000 mLs Intravenous New Bag/Given 03/20/22 1929)  sodium chloride 0.9 % bolus 1,000 mL (0 mLs Intravenous Stopped 03/20/22 1929)  ceFEPIme (MAXIPIME) 2 g in sodium chloride 0.9 % 100 mL IVPB (2 g Intravenous New Bag/Given 03/20/22 1908)  metroNIDAZOLE (FLAGYL) IVPB 500 mg (500 mg Intravenous New Bag/Given 03/20/22 1906)  acetaminophen (TYLENOL) tablet 1,000 mg (1,000 mg Oral Given 03/20/22 1929)     IMPRESSION / MDM / ASSESSMENT AND PLAN / ED COURSE  I reviewed the triage vital signs and the nursing notes.                              Differential diagnosis includes, but is not limited to, infection, ACS, dehydration.  Patient's presentation is most consistent with acute presentation with potential threat to life or bodily function.  Patient presented to the emergency department today after suffering a fall.  Initial vital signs here are concerning for hypotension as well as fever.  Patient does have a chronic indwelling Foley.  Blood work without leukocytosis although lactic acid level was elevated.  Patient's blood pressure did improve with IV fluids.  Did start multiple broad-spectrum antibiotics.  UA is concerning for infection.  Discussed this with patient.  Discussed with Dr. Arville Care with hospitalist service who will plan on admission.   FINAL CLINICAL IMPRESSION(S) / ED  DIAGNOSES   Final diagnoses:  Sepsis, due to unspecified organism, unspecified whether acute organ dysfunction present [facility])  Lower urinary tract infectious disease     Note:  This document was prepared using Dragon voice recognition software and may include unintentional dictation errors.    [name], MD 03/20/22 2051

## 2022-03-20 NOTE — Assessment & Plan Note (Signed)
-   We will place the patient on supplemental coverage with NovoLog and continue his Glucotrol XL.

## 2022-03-20 NOTE — H&P (Signed)
[name]   PATIENT NAME: [name]    MR#:  [id number]  DATE OF BIRTH:  01-06-1956  DATE OF ADMISSION:  03/20/2022  PRIMARY CARE PHYSICIAN: [name], MD   Patient is coming from: Home  REQUESTING/REFERRING PHYSICIAN: Nance Pear, MD  CHIEF COMPLAINT:   Chief Complaint  Patient presents with   Fever   Fall    HISTORY OF PRESENT ILLNESS:  [name] is a 66 y.o. male with medical history significant for bipolar disorder, type 2 diabetes mellitus, stage IIIa chronic kidney disease, hypertension and traumatic brain injury, who presented to the emergency room with acute onset of fever with associated chills and a fall as he was getting out of his car.  He denied any head injuries or other injuries.  He was seen earlier by his PCP.  He denies any significant dysuria, oliguria, hematuria urgency or frequency or flank pain.  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  He denied any nausea vomiting or abdominal pain.  No cough or wheezing or dyspnea.  No chest pain or palpitations.  No rhinorrhea, nasal congestion or sore throat or earache.  ED Course: When he came to the ER, temperature was 101.7, BP was 83/42 and later respiratory 23.  BP later on improved with hydration to 130/82.  Vital signs were otherwise.  Labs revealed a UA that was strongly positive for UTI and CBC with anemia better than previous levels.  Lactic acid was 3.7 and later 1.3 and CMP was remarkable for mild hyponatremia and a CO2 of 18 with a blood glucose of 214 and BUN 33 with creatinine of 2.1 up from 22/1.62 on 02/25/2022 with stage IIIa chronic kidney disease.  Albumin was 3.1 and troponin 8.4.  INR was 1.4 and PT 17.1. EKG as reviewed by me : EKG showed sinus rhythm with a rate of 97 Imaging: 2 view chest x-ray showed mild vascular congestion without edema and mild bibasal atelectasis.  The patient was given 1 mg of p.o. Tylenol, 2 g of IV cefepime and  500 mg of IV Flagyl, 1 L bolus of elevated creatinine given 1 L bolus of IV normal saline. PAST MEDICAL HISTORY:   Past Medical History:  Diagnosis Date   Bipolar disorder ([place])    Diabetes mellitus without complication ([place])    History of blood clots    Hypertension    TBI (traumatic brain injury) ([place])     PAST SURGICAL HISTORY:   Past Surgical History:  Procedure Laterality Date   BACK SURGERY     CARPAL TUNNEL RELEASE Bilateral    COLON SURGERY     COLOSTOMY     IR PERC TUN PERIT CATH WO PORT S&I /IMAG  10/11/2021   IR REMOVAL TUN CV CATH W/O FL  11/19/2021   IR US GUIDE VASC ACCESS RIGHT  10/11/2021   TONSILLECTOMY      SOCIAL HISTORY:   Social History   Tobacco Use   Smoking status: Never    Passive exposure: Never   Smokeless tobacco: Never  Substance Use Topics   Alcohol use: Not Currently    FAMILY HISTORY:   Family History  Problem Relation Age of Onset   Depression Father    Deep vein thrombosis Father     DRUG ALLERGIES:  No Known Allergies  REVIEW OF SYSTEMS:   ROS As per history of present illness. All pertinent systems were reviewed above. Constitutional, HEENT, cardiovascular, respiratory, GI, GU,  musculoskeletal, neuro, psychiatric, endocrine, integumentary and hematologic systems were reviewed and are otherwise negative/unremarkable except for positive findings mentioned above in the HPI.   MEDICATIONS AT HOME:   Prior to Admission medications   Medication Sig Start Date End Date Taking? Authorizing Provider  amLODipine-olmesartan (AZOR) 5-20 MG tablet Take 1 tablet by mouth daily. 01/08/22   [provider]  ascorbic acid (VITAMIN C) 500 MG tablet Take 1 tablet (500 mg total) by mouth daily. Patient not taking: Reported on 02/27/2022 07/31/21   Fritzi Mandes, MD  cholecalciferol (VITAMIN D) 25 MCG tablet Take 1 tablet (1,000 Units total) by mouth daily. 07/31/21   Fritzi Mandes, MD  ELIQUIS 5 MG TABS tablet Take 5 mg by mouth 2 (two)  times daily.  11/17/18   [provider]  fish oil-omega-3 fatty acids 1000 MG capsule SMARTSIG:1 Capsule(s) By Mouth Daily 10/12/21   [provider]  glipiZIDE (GLUCOTROL XL) 2.5 MG 24 hr tablet Take 1 tablet (2.5 mg total) by mouth daily with breakfast. 06/25/21   Loletha Grayer, MD  memantine (NAMENDA) 10 MG tablet Take 10 mg by mouth 2 (two) times daily.    [provider]  Oxcarbazepine (TRILEPTAL) 300 MG tablet Take 1 tablet (300 mg total) by mouth 2 (two) times daily. 02/27/22   Ursula Alert, MD  QUEtiapine (SEROQUEL) 100 MG tablet Take 1 tablet (100 mg total) by mouth at bedtime. 02/27/22   Ursula Alert, MD  QUEtiapine (SEROQUEL) 50 MG tablet TAKE 1/2 TO 1 (ONE-HALF TO ONE) TABLET BY MOUTH ONCE DAILY AS NEEDED FOR  AGITATION 02/27/22   Ursula Alert, MD  rosuvastatin (CRESTOR) 20 MG tablet Take 20 mg by mouth daily.  08/29/17   [provider]  vitamin B-12 (CYANOCOBALAMIN) 1000 MCG tablet Take 3,000 mcg by mouth daily.    [provider]  zinc sulfate 220 (50 Zn) MG capsule Take 1 capsule (220 mg total) by mouth daily. Patient not taking: Reported on 02/27/2022 07/31/21   Fritzi Mandes, MD      VITAL SIGNS:  Blood pressure 106/66, pulse 75, temperature 100.3 F (37.9 C), temperature source Axillary, resp. rate 16, height 5\' 6"  (1.676 m), weight 103.9 kg, SpO2 97 %.  PHYSICAL EXAMINATION:  Physical Exam  GENERAL:  66 y.o.-year-old male patient lying in the bed with no acute distress.  EYES: Pupils equal, round, reactive to light and accommodation. No scleral icterus. Extraocular muscles intact.  HEENT: Head atraumatic, normocephalic. Oropharynx and nasopharynx clear.  NECK:  Supple, no jugular venous distention. No thyroid enlargement, no tenderness.  LUNGS: Normal breath sounds bilaterally, no wheezing, rales,rhonchi or crepitation. No use of accessory muscles of respiration.  CARDIOVASCULAR: Regular rate and rhythm, S1, S2 normal. No  murmurs, rubs, or gallops.  ABDOMEN: Soft, nondistended, nontender. Bowel sounds present. No organomegaly or mass.  Left colostomy in place. GU: Indwelling Foley catheter with clear urine.   EXTREMITIES: No pedal edema, cyanosis, or clubbing.  NEUROLOGIC: Cranial nerves II through XII are intact. Muscle strength 5/5 in all extremities. Sensation intact. Gait not checked.  PSYCHIATRIC: The patient is alert and oriented x 3.  Normal affect and good eye contact. SKIN: No obvious rash, lesion, or ulcer.   LABORATORY PANEL:   CBC Recent Labs  Lab 03/20/22 1758  WBC 7.2  HGB 8.9*  HCT 30.7*  PLT 219   ------------------------------------------------------------------------------------------------------------------  Chemistries  Recent Labs  Lab 03/20/22 1758  NA 133*  K 4.5  CL 107  CO2 18*  GLUCOSE  214*  BUN 33*  CREATININE 2.10*  CALCIUM 8.4*  AST 21  ALT 14  ALKPHOS 111  BILITOT 0.5   ------------------------------------------------------------------------------------------------------------------  Cardiac Enzymes No results for input(s): "TROPONINI" in the last 168 hours. ------------------------------------------------------------------------------------------------------------------  RADIOLOGY:  DG Chest 2 View  Result Date: 03/20/2022 CLINICAL DATA:  Suspected sepsis.  Fall. EXAM: CHEST - 2 VIEW COMPARISON:  Chest 02/22/2022 FINDINGS: Cardiac enlargement. Mild vascular congestion without edema or effusion. Mild bibasilar airspace disease likely atelectasis. IMPRESSION: Mild vascular congestion without edema Mild bibasilar atelectasis. Electronically Signed   By: Franchot Gallo M.D.   On: 03/20/2022 18:19      IMPRESSION AND PLAN:  Assessment and Plan: * Sepsis due to gram-negative UTI [facility]) - The patient be admitted to a medical telemetry bed. - Sepsis manifested by fever, heart rate of 99 and later respiratory rate of 23. - We will continue antibiotic therapy  with IV Rocephin. - We will continue hydration with IV normal saline. - We will follow blood and urine cultures.  Severe sepsis ([place]) - This is manifested by elevated lactic acid, initial hypotension and AKI. - Management as above.  Acute kidney injury superimposed on chronic kidney disease ([place]) - This could be related to mild volume depletion. - We will continue hydration with IV normal saline and follow BMP. - We will avoid nephrotoxins.  Essential hypertension - We will continue Norvasc with continued improvement of his blood pressure  Dyslipidemia - We will continue statin therapy.  Type 2 diabetes mellitus with chronic kidney disease, without long-term current use of insulin ([facility]) - We will place the patient on supplemental coverage with NovoLog and continue his Glucotrol XL.  TBI (traumatic brain injury) ([place]) - We will continue his Namenda.  History of DVT (deep vein thrombosis) - We will continue Eliquis.  Bipolar I disorder, most recent episode mixed ([place]) - We will continue Seroquel.       DVT prophylaxis: Lovenox.  Advanced Care Planning:  Code Status: full code.  Family Communication:  The plan of care was discussed in details with the patient (and family). I answered all questions. The patient agreed to proceed with the above mentioned plan. Further management will depend upon hospital course. Disposition Plan: Back to previous home environment Consults called: none.  All the records are reviewed and case discussed with ED provider.  Status is: Inpatient    At the time of the admission, it appears that the appropriate admission status for this patient is inpatient.  This is judged to be reasonable and necessary in order to provide the required intensity of service to ensure the patient's safety given the presenting symptoms, physical exam findings and initial radiographic and laboratory data in the context of comorbid conditions.  The patient requires  inpatient status due to high intensity of service, high risk of further deterioration and high frequency of surveillance required.  I certify that at the time of admission, it is my clinical judgment that the patient will require inpatient hospital care extending more than 2 midnights.                            Dispo: The patient is from: Home              Anticipated d/c is to: Home              Patient currently is not medically stable to d/c.  Difficult to place patient: No  Christel Mormon M.D on 03/20/2022 at 11:32 PM  [facility]   From 7 PM-7 AM, contact night-coverage [email or web address]  CC: Primary care physician; [name], MD

## 2022-03-21 DIAGNOSIS — A4150 Gram-negative sepsis, unspecified: Secondary | ICD-10-CM | POA: Diagnosis not present

## 2022-03-21 DIAGNOSIS — N390 Urinary tract infection, site not specified: Secondary | ICD-10-CM | POA: Diagnosis not present

## 2022-03-21 DIAGNOSIS — N179 Acute kidney failure, unspecified: Secondary | ICD-10-CM | POA: Diagnosis not present

## 2022-03-21 DIAGNOSIS — I10 Essential (primary) hypertension: Secondary | ICD-10-CM | POA: Diagnosis not present

## 2022-03-21 LAB — PROCALCITONIN: Procalcitonin: 0.61 ng/mL

## 2022-03-21 LAB — BASIC METABOLIC PANEL
Anion gap: 5 (ref 5–15)
BUN: 30 mg/dL — ABNORMAL HIGH (ref 8–23)
CO2: 21 mmol/L — ABNORMAL LOW (ref 22–32)
Calcium: 8.2 mg/dL — ABNORMAL LOW (ref 8.9–10.3)
Chloride: 109 mmol/L (ref 98–111)
Creatinine, Ser: 1.83 mg/dL — ABNORMAL HIGH (ref 0.61–1.24)
GFR, Estimated: 40 mL/min — ABNORMAL LOW (ref >60)
Glucose, Bld: 125 mg/dL — ABNORMAL HIGH (ref 70–99)
Potassium: 4.1 mmol/L (ref 3.5–5.1)
Sodium: 135 mmol/L (ref 135–145)

## 2022-03-21 LAB — CBC
HCT: 27.6 % — ABNORMAL LOW (ref 39.0–52.0)
Hemoglobin: 8.2 g/dL — ABNORMAL LOW (ref 13.0–17.0)
MCH: 24.8 pg — ABNORMAL LOW (ref 26.0–34.0)
MCHC: 29.7 g/dL — ABNORMAL LOW (ref 30.0–36.0)
MCV: 83.4 fL (ref 80.0–100.0)
Platelets: 197 10*3/uL (ref 150–400)
RBC: 3.31 MIL/uL — ABNORMAL LOW (ref 4.22–5.81)
RDW: 18.4 % — ABNORMAL HIGH (ref 11.5–15.5)
WBC: 6.4 10*3/uL (ref 4.0–10.5)
nRBC: 0 % (ref 0.0–0.2)

## 2022-03-21 LAB — GLUCOSE, CAPILLARY
Glucose-Capillary: 98 mg/dL (ref 70–99)
Glucose-Capillary: 124 mg/dL — ABNORMAL HIGH (ref 70–99)

## 2022-03-21 LAB — CBG MONITORING, ED: Glucose-Capillary: 92 mg/dL (ref 70–99)

## 2022-03-21 LAB — HIV ANTIBODY (ROUTINE TESTING W REFLEX): HIV Screen 4th Generation wRfx: NONREACTIVE

## 2022-03-21 LAB — PROTIME-INR
INR: 1.4 — ABNORMAL HIGH (ref 0.8–1.2)
Prothrombin Time: 17 seconds — ABNORMAL HIGH (ref 11.4–15.2)

## 2022-03-21 LAB — CORTISOL-AM, BLOOD: Cortisol - AM: 9.9 ug/dL (ref 6.7–22.6)

## 2022-03-21 MED ORDER — INSULIN ASPART 100 UNIT/ML IJ SOLN
0.0000 [IU] | Freq: Three times a day (TID) | INTRAMUSCULAR | Status: DC
  Administered 2022-03-23 (×2): 1 [IU] via SUBCUTANEOUS
  Filled 2022-03-21 (×2): qty 1

## 2022-03-21 NOTE — Progress Notes (Signed)
PROGRESS NOTE    [name]  TMA:[id number] DOB: 1956-05-18 DOA: 03/20/2022 PCP: [name], MD  Assessment & Plan:   Principal Problem:   Sepsis due to gram-negative UTI [facility]) Active Problems:   Severe sepsis ([place])   Acute kidney injury superimposed on chronic kidney disease ([place])   Essential hypertension   Bipolar I disorder, most recent episode mixed ([place])   History of DVT (deep vein thrombosis)   TBI (traumatic brain injury) ([place])   Sepsis secondary to UTI ([place])   Type 2 diabetes mellitus with chronic kidney disease, without long-term current use of insulin ([place])   Dyslipidemia  Assessment and Plan: Likely UTI: continue on IV rocephin. Abxs ordered w/o collecting urine cx. Urine cx ordered. Exchange foley and then get urine cx as pt has chronic foley. UTI likely related to chronic foley    Severe sepsis: met criteria w/ fever, tachycardia, tachypnea, elevated lactic acid, hypotension, AKI & likely UTI. Continue on IV abxs  AKI on CKDIIIa: Cr is trending down from day prior. 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 No need for a transfusion currently    HTN: continue on amlodipine    HLD: continue on statin    DM2: likely poorly controlled. Continue on SSI w/ accuchecks  Hx of TBI: continue on home dose of namenda. Hx of chronic foley & colostomy    Hx of DVT: continue on eliquis   Bipolar I disorder: unknown type or severity. Continue on home dose of seroquel   Obesity: BMI 36.9. Complicates overall care & prognosis     DVT prophylaxis: eliquis  Code Status: full  Family Communication: Disposition Plan: likely d/c back home  Level of care: Telemetry Medical  Status is: Inpatient Remains inpatient appropriate because: severity of illness    Consultants:    Procedures:   Antimicrobials: rocephin  Subjective: Pt c/o malaise   Objective: Vitals:   03/21/22 0200 03/21/22 0530 03/21/22 0600 03/21/22 0800  BP:  107/62 107/63 120/74 (!) 141/84  Pulse: 64 62 69 74  Resp: 16 16 14  (!) 23  Temp: 98.2 F (36.8 C)  98.2 F (36.8 C) 98.9 F (37.2 C)  TempSrc: Oral   Oral  SpO2: 100% 97% 98% 99%  Weight:      Height:        Intake/Output Summary (Last 24 hours) at 03/21/2022 0826 Last data filed at 03/21/2022 0553 Gross per 24 hour  Intake 50 ml  Output --  Net 50 ml   Filed Weights   03/20/22 1748  Weight: 103.9 kg    Examination:  General exam: Appears calm and comfortable  Respiratory system: Clear to auscultation. Respiratory effort normal. Cardiovascular system: S1 & S2 +. No rubs, gallops or clicks.  Gastrointestinal system: Abdomen is nondistended, soft and nontender. Normal bowel sounds heard. Central nervous system: Alert and awake. Moves all extremities  Psychiatry: Judgement and insight appear normal. Flat mood and affect     Data Reviewed: I have personally reviewed following labs and imaging studies  CBC: Recent Labs  Lab 03/20/22 1758 03/21/22 0544  WBC 7.2 6.4  NEUTROABS 5.5  --   HGB 8.9* 8.2*  HCT 30.7* 27.6*  MCV 84.1 83.4  PLT 219 256   Basic Metabolic Panel: Recent Labs  Lab 03/20/22 1758 03/21/22 0544  NA 133* 135  K 4.5 4.1  CL 107 109  CO2 18* 21*  GLUCOSE 214* 125*  BUN 33* 30*  CREATININE 2.10* 1.83*  CALCIUM 8.4* 8.2*   GFR: Estimated Creatinine Clearance: 44.8 mL/min (A) (by C-G formula based on SCr of 1.83 mg/dL (H)). Liver Function Tests: Recent Labs  Lab 03/20/22 1758  AST 21  ALT 14  ALKPHOS 111  BILITOT 0.5  PROT 8.4*  ALBUMIN 3.1*   No results for input(s): "LIPASE", "AMYLASE" in the last 168 hours. No results for input(s): "AMMONIA" in the last 168 hours. Coagulation Profile: Recent Labs  Lab 03/20/22 1755 03/21/22 0544  INR 1.4* 1.4*   Cardiac Enzymes: No results for input(s): "CKTOTAL", "CKMB", "CKMBINDEX", "TROPONINI" in the last 168 hours. BNP (last 3 results) No results for input(s): "PROBNP" in the last  8760 hours. HbA1C: No results for input(s): "HGBA1C" in the last 72 hours. CBG: No results for input(s): "GLUCAP" in the last 168 hours. Lipid Profile: No results for input(s): "CHOL", "HDL", "LDLCALC", "TRIG", "CHOLHDL", "LDLDIRECT" in the last 72 hours. Thyroid Function Tests: No results for input(s): "TSH", "T4TOTAL", "FREET4", "T3FREE", "THYROIDAB" in the last 72 hours. Anemia Panel: No results for input(s): "VITAMINB12", "FOLATE", "FERRITIN", "TIBC", "IRON", "RETICCTPCT" in the last 72 hours. Sepsis Labs: Recent Labs  Lab 03/20/22 1758 03/20/22 2151 03/21/22 0544  PROCALCITON  --   --  0.61  LATICACIDVEN 3.7* 1.3  --     Recent Results (from the past 240 hour(s))  SARS Coronavirus 2 by RT PCR (hospital order, performed in [facility] hospital lab) *cepheid single result test* Anterior Nasal Swab     Status: None   Collection Time: 03/20/22  5:50 PM   Specimen: Anterior Nasal Swab  Result Value Ref Range Status   SARS Coronavirus 2 by RT PCR NEGATIVE NEGATIVE Final    Comment: (NOTE) SARS-CoV-2 target nucleic acids are NOT DETECTED.  The SARS-CoV-2 RNA is generally detectable in upper and lower respiratory specimens during the acute phase of infection. The lowest concentration of SARS-CoV-2 viral copies this assay can detect is 250 copies / mL. A negative result does not preclude SARS-CoV-2 infection and should not be used as the sole basis for treatment or other patient management decisions.  A negative result may occur with improper specimen collection / handling, submission of specimen other than nasopharyngeal swab, presence of viral mutation(s) within the areas targeted by this assay, and inadequate number of viral copies (<250 copies / mL). A negative result must be combined with clinical observations, patient history, and epidemiological information.  Fact Sheet for Patients:   https://www.patel.info/  Fact Sheet for Healthcare  Providers: https://hall.com/  This test is not yet approved or  cleared by the [place] FDA and has been authorized for detection and/or diagnosis of SARS-CoV-2 by FDA under an Emergency Use Authorization (EUA).  This EUA will remain in effect (meaning this test can be used) for the duration of the COVID-19 declaration under Section 564(b)(1) of the Act, 21 U.S.C. section 360bbb-3(b)(1), unless the authorization is terminated or revoked sooner.  Performed at [facility], [address] [place], [place] [address]   Culture, blood (Routine x 2)     Status: None (Preliminary result)   Collection Time: 03/20/22  5:57 PM   Specimen: BLOOD LEFT ARM  Result Value Ref Range Status   Specimen Description BLOOD LEFT ARM  Final   Special Requests   Final    BOTTLES DRAWN AEROBIC AND ANAEROBIC Blood Culture adequate volume   Culture   Final    NO GROWTH < 24 HOURS Performed at [facility], [place],  [place] [address]    Report Status PENDING  Incomplete  Culture, blood (Routine x 2)     Status: None (Preliminary result)   Collection Time: 03/20/22  7:09 PM   Specimen: BLOOD  Result Value Ref Range Status   Specimen Description BLOOD RIGHT ANTECUBITAL  Final   Special Requests   Final    BOTTLES DRAWN AEROBIC AND ANAEROBIC Blood Culture results may not be optimal due to an excessive volume of blood received in culture bottles   Culture   Final    NO GROWTH < 24 HOURS Performed at [facility], [address] [place], [place] [address]    Report Status PENDING  Incomplete         Radiology Studies: DG Chest 2 View  Result Date: 03/20/2022 CLINICAL DATA:  Suspected sepsis.  Fall. EXAM: CHEST - 2 VIEW COMPARISON:  Chest 02/22/2022 FINDINGS: Cardiac enlargement. Mild vascular congestion without edema or effusion. Mild bibasilar airspace disease likely atelectasis. IMPRESSION: Mild vascular congestion without  edema Mild bibasilar atelectasis. Electronically Signed   By: Franchot Gallo M.D.   On: 03/20/2022 18:19        Scheduled Meds:  apixaban  5 mg Oral BID   ascorbic acid  500 mg Oral Daily   vitamin D3  1,000 Units Oral Daily   cyanocobalamin  3,000 mcg Oral Daily   glipiZIDE  2.5 mg Oral Q breakfast   memantine  10 mg Oral BID   omega-3 acid ethyl esters  1 capsule Oral Daily   Oxcarbazepine  300 mg Oral BID   QUEtiapine  100 mg Oral QHS   rosuvastatin  20 mg Oral Daily   Continuous Infusions:  sodium chloride 100 mL/hr at 03/21/22 0223   cefTRIAXone (ROCEPHIN)  IV Stopped (03/21/22 0553)     LOS: 1 day    Time spent: 35 mins    Wyvonnia Dusky, MD [facility] Pager [phone]  If 7PM-7AM, please contact night-coverage [email or web address] 03/21/2022, 8:26 AM

## 2022-03-22 ENCOUNTER — Ambulatory Visit: Payer: Medicare Other | Admitting: Physician Assistant

## 2022-03-22 DIAGNOSIS — E1122 Type 2 diabetes mellitus with diabetic chronic kidney disease: Secondary | ICD-10-CM | POA: Diagnosis not present

## 2022-03-22 DIAGNOSIS — N1831 Chronic kidney disease, stage 3a: Secondary | ICD-10-CM

## 2022-03-22 DIAGNOSIS — N179 Acute kidney failure, unspecified: Secondary | ICD-10-CM | POA: Diagnosis not present

## 2022-03-22 DIAGNOSIS — A4150 Gram-negative sepsis, unspecified: Secondary | ICD-10-CM | POA: Diagnosis not present

## 2022-03-22 DIAGNOSIS — N390 Urinary tract infection, site not specified: Secondary | ICD-10-CM | POA: Diagnosis not present

## 2022-03-22 LAB — CBC
HCT: 25.3 % — ABNORMAL LOW (ref 39.0–52.0)
Hemoglobin: 7.9 g/dL — ABNORMAL LOW (ref 13.0–17.0)
MCH: 25.7 pg — ABNORMAL LOW (ref 26.0–34.0)
MCHC: 31.2 g/dL (ref 30.0–36.0)
MCV: 82.4 fL (ref 80.0–100.0)
Platelets: 177 10*3/uL (ref 150–400)
RBC: 3.07 MIL/uL — ABNORMAL LOW (ref 4.22–5.81)
RDW: 18.3 % — ABNORMAL HIGH (ref 11.5–15.5)
WBC: 4.5 10*3/uL (ref 4.0–10.5)
nRBC: 0 % (ref 0.0–0.2)

## 2022-03-22 LAB — BASIC METABOLIC PANEL
Anion gap: 6 (ref 5–15)
BUN: 22 mg/dL (ref 8–23)
CO2: 21 mmol/L — ABNORMAL LOW (ref 22–32)
Calcium: 8.2 mg/dL — ABNORMAL LOW (ref 8.9–10.3)
Chloride: 108 mmol/L (ref 98–111)
Creatinine, Ser: 1.72 mg/dL — ABNORMAL HIGH (ref 0.61–1.24)
GFR, Estimated: 43 mL/min — ABNORMAL LOW (ref >60)
Glucose, Bld: 156 mg/dL — ABNORMAL HIGH (ref 70–99)
Potassium: 3.8 mmol/L (ref 3.5–5.1)
Sodium: 135 mmol/L (ref 135–145)

## 2022-03-22 LAB — GLUCOSE, CAPILLARY
Glucose-Capillary: 117 mg/dL — ABNORMAL HIGH (ref 70–99)
Glucose-Capillary: 158 mg/dL — ABNORMAL HIGH (ref 70–99)
Glucose-Capillary: 148 mg/dL — ABNORMAL HIGH (ref 70–99)
Glucose-Capillary: 138 mg/dL — ABNORMAL HIGH (ref 70–99)

## 2022-03-22 MED ORDER — AMLODIPINE BESYLATE 5 MG PO TABS
5.0000 mg | ORAL_TABLET | Freq: Every day | ORAL | Status: DC
  Administered 2022-03-22 – 2022-03-23 (×2): 5 mg via ORAL
  Filled 2022-03-22 (×2): qty 1

## 2022-03-22 MED ORDER — IRBESARTAN 150 MG PO TABS
150.0000 mg | ORAL_TABLET | Freq: Every day | ORAL | Status: DC
  Administered 2022-03-22 – 2022-03-23 (×2): 150 mg via ORAL
  Filled 2022-03-22 (×2): qty 1

## 2022-03-22 NOTE — Care Management Important Message (Signed)
Important Message  Patient Details  Name: [name] MRN: [id number] Date of Birth: March 14, 1956   Medicare Important Message Given:  N/A - LOS <3 / Initial given by admissions     Dannette Barbara 03/22/2022, 1:58 PM

## 2022-03-22 NOTE — Progress Notes (Signed)
PROGRESS NOTE    [name]  KXF:[id number] DOB: April 05, 1956 DOA: 03/20/2022 PCP: [name], MD  Assessment & Plan:   Principal Problem:   Sepsis due to gram-negative UTI [facility]) Active Problems:   Severe sepsis ([place])   Acute kidney injury superimposed on chronic kidney disease ([place])   Essential hypertension   Bipolar I disorder, most recent episode mixed ([place])   History of DVT (deep vein thrombosis)   TBI (traumatic brain injury) ([place])   Sepsis secondary to UTI ([place])   Type 2 diabetes mellitus with chronic kidney disease, without long-term current use of insulin ([place])   Dyslipidemia  Assessment and Plan: Likely UTI: continue on IV ceftriaxone. Abxs ordered w/o collecting urine cx. Urine cx is pending. UTI is likely related to chronic foley     Severe sepsis: met criteria w/ fever, tachycardia, tachypnea, elevated lactic acid, hypotension, AKI & likely UTI. Continue on IV abxs. Severe sepsis resolved   AKI on CKDIIIa: Cr is trending down daily. Avoid nephrotoxic meds  ACD: likely secondary to CKD. Will transfuse if Hb < 7.0    HTN: restart amlodipine, ARB   HLD: continue on statin    DM2: pretty well controlled, HbA1c 6.8 in 7/23. Continue on SSI w/ accuchecks   Hx of TBI: continue on home dose of namenda. Hx of chronic foley & colostomy    Hx of DVT: continue on eliquis   Bipolar I disorder: unknown type or severity. Continue on home dose of seroquel   Obesity: BMI 36.9. Complicates overall care & prognosis     DVT prophylaxis: eliquis  Code Status: full  Family Communication: discussed pt's care w/ pt's wife, [name], and answered her questions  Disposition Plan: likely d/c back home  Level of care: Med-Surg  Status is: Inpatient Remains inpatient appropriate because: severity of illness    Consultants:    Procedures:   Antimicrobials: rocephin  Subjective: Pt c/o fatigue   Objective: Vitals:   03/21/22 1631 03/21/22 2027 03/22/22  0247 03/22/22 0835  BP: (!) 153/82 (!) 145/76 (!) 156/85 (!) 166/96  Pulse: 72 71 63 70  Resp: 15 18 20 16   Temp: 97.8 F (36.6 C) 98.8 F (37.1 C) 97.6 F (36.4 C) (!) 97.4 F (36.3 C)  TempSrc: Oral Oral    SpO2: 100% 98% 98% 96%  Weight:      Height:        Intake/Output Summary (Last 24 hours) at 03/22/2022 1358 Last data filed at 03/22/2022 6967 Gross per 24 hour  Intake 2377.94 ml  Output 1425 ml  Net 952.94 ml   Filed Weights   03/20/22 1748  Weight: 103.9 kg    Examination:  General exam: Appears comfortable & calm  Respiratory system: clear breath sounds b/l  Cardiovascular system: S1/S2+. No rubs or gallops  Gastrointestinal system: Abd is soft, NT, obese & normal bowel sounds  Central nervous system: Alert and oriented.  Psychiatry: Judgement and insight appears at baseline. Flat mood and affect     Data Reviewed: I have personally reviewed following labs and imaging studies  CBC: Recent Labs  Lab 03/20/22 1758 03/21/22 0544 03/22/22 0426  WBC 7.2 6.4 4.5  NEUTROABS 5.5  --   --   HGB 8.9* 8.2* 7.9*  HCT 30.7* 27.6* 25.3*  MCV 84.1 83.4 82.4  PLT 219 197 893   Basic Metabolic Panel: Recent Labs  Lab 03/20/22 1758 03/21/22 0544 03/22/22 0426  NA 133* 135 135  K 4.5 4.1 3.8  CL 107 109 108  CO2 18* 21* 21*  GLUCOSE 214* 125* 156*  BUN 33* 30* 22  CREATININE 2.10* 1.83* 1.72*  CALCIUM 8.4* 8.2* 8.2*   GFR: Estimated Creatinine Clearance: 47.7 mL/min (A) (by C-G formula based on SCr of 1.72 mg/dL (H)). Liver Function Tests: Recent Labs  Lab 03/20/22 1758  AST 21  ALT 14  ALKPHOS 111  BILITOT 0.5  PROT 8.4*  ALBUMIN 3.1*   No results for input(s): "LIPASE", "AMYLASE" in the last 168 hours. No results for input(s): "AMMONIA" in the last 168 hours. Coagulation Profile: Recent Labs  Lab 03/20/22 1755 03/21/22 0544  INR 1.4* 1.4*   Cardiac Enzymes: No results for input(s): "CKTOTAL", "CKMB", "CKMBINDEX", "TROPONINI" in the  last 168 hours. BNP (last 3 results) No results for input(s): "PROBNP" in the last 8760 hours. HbA1C: No results for input(s): "HGBA1C" in the last 72 hours. CBG: Recent Labs  Lab 03/21/22 1550 03/21/22 1855 03/21/22 2258 03/22/22 0835 03/22/22 1216  GLUCAP 92 98 124* 117* 158*   Lipid Profile: No results for input(s): "CHOL", "HDL", "LDLCALC", "TRIG", "CHOLHDL", "LDLDIRECT" in the last 72 hours. Thyroid Function Tests: No results for input(s): "TSH", "T4TOTAL", "FREET4", "T3FREE", "THYROIDAB" in the last 72 hours. Anemia Panel: No results for input(s): "VITAMINB12", "FOLATE", "FERRITIN", "TIBC", "IRON", "RETICCTPCT" in the last 72 hours. Sepsis Labs: Recent Labs  Lab 03/20/22 1758 03/20/22 2151 03/21/22 0544  PROCALCITON  --   --  0.61  LATICACIDVEN 3.7* 1.3  --     Recent Results (from the past 240 hour(s))  SARS Coronavirus 2 by RT PCR (hospital order, performed in [facility] hospital lab) *cepheid single result test* Anterior Nasal Swab     Status: None   Collection Time: 03/20/22  5:50 PM   Specimen: Anterior Nasal Swab  Result Value Ref Range Status   SARS Coronavirus 2 by RT PCR NEGATIVE NEGATIVE Final    Comment: (NOTE) SARS-CoV-2 target nucleic acids are NOT DETECTED.  The SARS-CoV-2 RNA is generally detectable in upper and lower respiratory specimens during the acute phase of infection. The lowest concentration of SARS-CoV-2 viral copies this assay can detect is 250 copies / mL. A negative result does not preclude SARS-CoV-2 infection and should not be used as the sole basis for treatment or other patient management decisions.  A negative result may occur with improper specimen collection / handling, submission of specimen other than nasopharyngeal swab, presence of viral mutation(s) within the areas targeted by this assay, and inadequate number of viral copies (<250 copies / mL). A negative result must be combined with clinical observations, patient  history, and epidemiological information.  Fact Sheet for Patients:   https://www.patel.info/  Fact Sheet for Healthcare Providers: https://hall.com/  This test is not yet approved or  cleared by the [place] FDA and has been authorized for detection and/or diagnosis of SARS-CoV-2 by FDA under an Emergency Use Authorization (EUA).  This EUA will remain in effect (meaning this test can be used) for the duration of the COVID-19 declaration under Section 564(b)(1) of the Act, 21 U.S.C. section 360bbb-3(b)(1), unless the authorization is terminated or revoked sooner.  Performed at [facility], [address] [place], [place] [address]   Culture, blood (Routine x 2)     Status: None (Preliminary result)   Collection Time: 03/20/22  5:57 PM   Specimen: BLOOD LEFT ARM  Result Value Ref Range Status   Specimen Description BLOOD LEFT ARM  Final   Special Requests  Final    BOTTLES DRAWN AEROBIC AND ANAEROBIC Blood Culture adequate volume   Culture   Final    NO GROWTH 2 DAYS Performed at [facility], [address] [place], [place] [address]    Report Status PENDING  Incomplete  Culture, blood (Routine x 2)     Status: None (Preliminary result)   Collection Time: 03/20/22  7:09 PM   Specimen: BLOOD  Result Value Ref Range Status   Specimen Description BLOOD RIGHT ANTECUBITAL  Final   Special Requests   Final    BOTTLES DRAWN AEROBIC AND ANAEROBIC Blood Culture results may not be optimal due to an excessive volume of blood received in culture bottles   Culture   Final    NO GROWTH 2 DAYS Performed at [facility], [address] [place], [place] [address]    Report Status PENDING  Incomplete         Radiology Studies: DG Chest 2 View  Result Date: 03/20/2022 CLINICAL DATA:  Suspected sepsis.  Fall. EXAM: CHEST - 2 VIEW COMPARISON:  Chest 02/22/2022 FINDINGS: Cardiac enlargement. Mild vascular  congestion without edema or effusion. Mild bibasilar airspace disease likely atelectasis. IMPRESSION: Mild vascular congestion without edema Mild bibasilar atelectasis. Electronically Signed   By: Franchot Gallo M.D.   On: 03/20/2022 18:19        Scheduled Meds:  amLODipine  5 mg Oral Daily   apixaban  5 mg Oral BID   ascorbic acid  500 mg Oral Daily   vitamin D3  1,000 Units Oral Daily   cyanocobalamin  3,000 mcg Oral Daily   insulin aspart  0-6 Units Subcutaneous TID WC   irbesartan  150 mg Oral Daily   memantine  10 mg Oral BID   omega-3 acid ethyl esters  1 capsule Oral Daily   Oxcarbazepine  300 mg Oral BID   QUEtiapine  100 mg Oral QHS   rosuvastatin  20 mg Oral Daily   Continuous Infusions:  sodium chloride 100 mL/hr at 03/22/22 0934   cefTRIAXone (ROCEPHIN)  IV 2 g (03/22/22 0644)     LOS: 2 days    Time spent: 30 mins    Wyvonnia Dusky, MD [facility] Pager [phone]  If 7PM-7AM, please contact night-coverage [email or web address] 03/22/2022, 1:58 PM

## 2022-03-22 NOTE — Evaluation (Signed)
Physical Therapy Evaluation Patient Details Name: [name] MRN: [id number] DOB: September 18, 1955 Today's Date: 03/22/2022  History of Present Illness  Pt is a 66 yo male that was admitted for likely UTI, sepsis, AKI on CKD. 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    Clinical Impression  Pt oriented to self, place, family in room. Some delayed processing and needed some correction from family in room when providing PLOF but follows all commands, pleasant. At baseline the pt needs physical assistance with all ADLs, as well as all mobility. Able to utilize hospital bed rails/WC to step pivot to WC, ramped entrances to home, and pulls on car to come up into standing for car transfers.   The patient was a maxA (2+ for safety) to come up into sitting EOB with bed rails, a bit fearful of falling. Assistance to reposition in midline needed as well. Fair sitting balance. Sit <> stand with RW and 2+ assist for safety performed three times. PT and tech stabilized RW (pt pulls on bed rails and WC at baseline to successfully transfer). 2nd and 3rd attempt pt minA-CGA to complete standing. No true ambulation (pt does not ambulate at baseline), able to side step at EOB to L 3-4 steps with minAx2 and assist with RW management. Pt endorsed feeling near his baseline but pt/family interested in HHPT to maximize function, BUE/BLE ROM, strength, and safety.      Recommendations for follow up therapy are one component of a multi-disciplinary discharge planning process, led by the attending physician.  Recommendations may be updated based on patient status, additional functional criteria and insurance authorization.  Follow Up Recommendations Home health PT       Assistance Recommended at Discharge Frequent or constant Supervision/Assistance  Patient can return home with the following  A lot of help with walking and/or transfers;A lot of help with bathing/dressing/bathroom;Direct supervision/assist for financial management;Assistance with cooking/housework;Assist for transportation;Help with stairs or ramp for entrance;Direct supervision/assist for medications management    Equipment Recommendations None recommended by PT  Recommendations for Other Services       Functional Status Assessment Patient has had a recent decline in their functional status and/or demonstrates limited ability to make significant improvements in function in a reasonable and predictable amount of time     Precautions / Restrictions Precautions Precautions: Fall Restrictions Weight Bearing Restrictions: No      Mobility  Bed Mobility Overal bed mobility: Needs Assistance Bed Mobility: Supine to Sit, Sit to Supine     Supine to sit: Max assist, HOB elevated, +2 for safety/equipment Sit to supine: Max assist, +2 for physical assistance        Transfers Overall transfer level: Needs assistance Equipment used: Rolling walker (2 wheels) Transfers: Sit to/from Stand Sit to Stand: Mod assist, Min guard, +2 safety/equipment           General transfer comment: performed three times. PT and tech stabilized RW (pt pulls on bed rails and WC at baseline to successfully transfer). 2nd and 3rd attempt pt minA-CGA to complete standing    Ambulation/Gait               General Gait Details: no true ambulation (pt does not ambulate at baseline), able to side step at EOB to L 3-4 steps with minAx2 and assist  with RW management  Stairs            Wheelchair Mobility    Modified Rankin (Stroke Patients Only)       Balance Overall balance assessment: Needs assistance Sitting-balance support: Feet supported, Bilateral upper extremity supported Sitting  balance-Leahy Scale: Fair Sitting balance - Comments: progressed to fair, reliant on rails and assist to achieve midline and reposition   Standing balance support: Reliant on assistive device for balance Standing balance-Leahy Scale: Poor                               Pertinent Vitals/Pain Pain Assessment Pain Assessment: No/denies pain    Home Living Family/patient expects to be discharged to:: Private residence Living Arrangements: Spouse/significant other Available Help at Discharge: Family;Available 24 hours/day Type of Home: House Home Access: Ramped entrance       Home Layout: One level Home Equipment: Wheelchair - manual;Wheelchair - power;Hospital bed;Shower seat;Rolling Environmental consultant (2 wheels)      Prior Function Prior Level of Function : Needs assist       Physical Assist : Mobility (physical);ADLs (physical)   ADLs (physical): IADLs;Toileting;Dressing;Bathing Mobility Comments: Spouse assists with bed mobility and SPT to/from WC. Reports needing assist for WC mobility. spends most of his day in bed ADLs Comments: Spouse assists with dressing, bathing, and IADLs. sink bath primarily.     Hand Dominance        Extremity/Trunk Assessment   Upper Extremity Assessment Upper Extremity Assessment: Generalized weakness (able to lift against gravity but pt very stiff)    Lower Extremity Assessment Lower Extremity Assessment: Generalized weakness (no active DF/PF noted, unable to wiggle toes, minimally able to lift against gravity)    Cervical / Trunk Assessment Cervical / Trunk Assessment: Kyphotic  Communication      Cognition Arousal/Alertness: Awake/alert Behavior During Therapy: WFL for tasks assessed/performed Overall Cognitive Status: Within Functional Limits for tasks assessed                                 General Comments: some delayed processing but spouse at bedside endorsed he's at baseline cognition, provides most PLOF but  some corrections needed from spouse        General Comments      Exercises     Assessment/Plan    PT Assessment Patient needs continued PT services  PT Problem List Decreased strength;Decreased mobility;Decreased range of motion;Decreased activity tolerance;Decreased balance       PT Treatment Interventions DME instruction;Therapeutic exercise;Gait training;Balance training;Stair training;Neuromuscular re-education;Functional mobility training;Therapeutic activities;Patient/family education    PT Goals (Current goals can be found in the Care Plan section)  Acute Rehab PT Goals Patient Stated Goal: to go home PT Goal Formulation: With patient Time For Goal Achievement: 04/05/22 Potential to Achieve Goals: Good    Frequency Min 2X/week     Co-evaluation               AM-PAC PT "6 Clicks" Mobility  Outcome Measure Help needed turning from your back to your side while in a flat bed without using bedrails?: Total Help needed moving from lying on your back to sitting on the side of a flat bed without using bedrails?: Total Help needed moving to and from a bed to a chair (including a wheelchair)?: A Lot Help needed standing up from a chair using your arms (e.g., wheelchair or  bedside chair)?: A Lot Help needed to walk in hospital room?: Total Help needed climbing 3-5 steps with a railing? : Total 6 Click Score: 8    End of Session   Activity Tolerance: Patient tolerated treatment well Patient left: in bed;with call bell/phone within reach;with bed alarm set Nurse Communication: Mobility status PT Visit Diagnosis: Other abnormalities of gait and mobility (R26.89);Difficulty in walking, not elsewhere classified (R26.2);Muscle weakness (generalized) (M62.81)    Time: [id number] PT Time Calculation (min) (ACUTE ONLY): 36 min   Charges:   PT Evaluation $PT Eval Moderate Complexity: 1 Mod PT Treatments $Therapeutic Activity: 23-37 mins        Lieutenant Diego PT,  DPT 4:12 PM,03/22/22

## 2022-03-23 DIAGNOSIS — N390 Urinary tract infection, site not specified: Secondary | ICD-10-CM | POA: Diagnosis not present

## 2022-03-23 DIAGNOSIS — N179 Acute kidney failure, unspecified: Secondary | ICD-10-CM | POA: Diagnosis not present

## 2022-03-23 DIAGNOSIS — E669 Obesity, unspecified: Secondary | ICD-10-CM

## 2022-03-23 DIAGNOSIS — A4150 Gram-negative sepsis, unspecified: Secondary | ICD-10-CM | POA: Diagnosis not present

## 2022-03-23 LAB — BASIC METABOLIC PANEL
Anion gap: 5 (ref 5–15)
BUN: 20 mg/dL (ref 8–23)
CO2: 20 mmol/L — ABNORMAL LOW (ref 22–32)
Calcium: 8.3 mg/dL — ABNORMAL LOW (ref 8.9–10.3)
Chloride: 114 mmol/L — ABNORMAL HIGH (ref 98–111)
Creatinine, Ser: 1.5 mg/dL — ABNORMAL HIGH (ref 0.61–1.24)
GFR, Estimated: 51 mL/min — ABNORMAL LOW (ref >60)
Glucose, Bld: 139 mg/dL — ABNORMAL HIGH (ref 70–99)
Potassium: 4.2 mmol/L (ref 3.5–5.1)
Sodium: 139 mmol/L (ref 135–145)

## 2022-03-23 LAB — URINE CULTURE: Culture: 20000 — AB

## 2022-03-23 LAB — CBC
HCT: 25.8 % — ABNORMAL LOW (ref 39.0–52.0)
Hemoglobin: 7.9 g/dL — ABNORMAL LOW (ref 13.0–17.0)
MCH: 25.2 pg — ABNORMAL LOW (ref 26.0–34.0)
MCHC: 30.6 g/dL (ref 30.0–36.0)
MCV: 82.4 fL (ref 80.0–100.0)
Platelets: 161 10*3/uL (ref 150–400)
RBC: 3.13 MIL/uL — ABNORMAL LOW (ref 4.22–5.81)
RDW: 18.4 % — ABNORMAL HIGH (ref 11.5–15.5)
WBC: 3.5 10*3/uL — ABNORMAL LOW (ref 4.0–10.5)
nRBC: 0 % (ref 0.0–0.2)

## 2022-03-23 LAB — GLUCOSE, CAPILLARY
Glucose-Capillary: 162 mg/dL — ABNORMAL HIGH (ref 70–99)
Glucose-Capillary: 152 mg/dL — ABNORMAL HIGH (ref 70–99)

## 2022-03-23 MED ORDER — CIPROFLOXACIN HCL 500 MG PO TABS: 500.0000 mg | ORAL_TABLET | Freq: Two times a day (BID) | ORAL | 0 refills | Status: AC

## 2022-03-23 MED ORDER — CHLORHEXIDINE GLUCONATE CLOTH 2 % EX PADS
6.0000 | MEDICATED_PAD | Freq: Every day | CUTANEOUS | Status: DC
  Administered 2022-03-23: 6 via TOPICAL

## 2022-03-23 NOTE — Progress Notes (Signed)
OT Cancellation Note  Patient Details Name: [name] MRN: [id number] DOB: 1955/10/07   Cancelled Treatment:    Reason Eval/Treat Not Completed: Patient declined, no reason specified. Received order, reviewed chart. Attempted evaluation but pt sleeping, difficult to rouse. After awakening following shoulder shake and sternal rub, pt states he is too tired to participate at present. Will attempt OT at a later time/date, as pt is alert and willing to engage.  Josiah Lobo, PhD, MS, OTR/L 03/23/22, 10:30 AM

## 2022-03-23 NOTE — Discharge Summary (Signed)
Physician Discharge Summary  [name] LKG:[id number] DOB: 01/03/1956 DOA: 03/20/2022  PCP: [name], MD  Admit date: 03/20/2022 Discharge date: 03/23/2022  Admitted From: home  Disposition:  home w/ home health   Recommendations for Outpatient Follow-up:  Follow up with PCP in 1 week   Home Health: yes Equipment/Devices:  Discharge Condition: stable  CODE STATUS: full  Diet recommendation: Heart Healthy / Carb Modified   Brief/Interim Summary: HPI was taken from Dr. Sidney Ace: [name] is a 66 y.o. male with medical history significant for bipolar disorder, type 2 diabetes mellitus, stage IIIa chronic kidney disease, hypertension and traumatic brain injury, who presented to the emergency room with acute onset of fever with associated chills and a fall as he was getting out of his car.  He denied any head injuries or other injuries.  He was seen earlier by his PCP.  He denies any significant dysuria, oliguria, hematuria urgency or frequency or flank pain.  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  He denied any nausea vomiting or abdominal pain.  No cough or wheezing or dyspnea.  No chest pain or palpitations.  No rhinorrhea, nasal congestion or sore throat or earache.   ED Course: When he came to the ER, temperature was 101.7, BP was 83/42 and later respiratory 23.  BP later on improved with hydration to 130/82.  Vital signs were otherwise.  Labs revealed a UA that was strongly positive for UTI and CBC with anemia better than previous levels.  Lactic acid was 3.7 and later 1.3 and CMP was remarkable for mild hyponatremia and a CO2 of 18 with a blood glucose of 214 and BUN 33 with creatinine of 2.1 up from 22/1.62 on 02/25/2022 with stage IIIa chronic kidney disease.  Albumin was 3.1 and troponin 8.4.  INR was 1.4 and PT 17.1. EKG as reviewed by me : EKG showed sinus rhythm with a rate of 97 Imaging: 2 view chest x-ray showed mild vascular  congestion without edema and mild bibasal atelectasis.   The patient was given 1 mg of p.o. Tylenol, 2 g of IV cefepime and 500 mg of IV Flagyl, 1 L bolus of elevated creatinine given 1 L bolus of IV normal saline.   As per Dr. Jimmye [place] 9/21-9/23/23: Pt presented w/ severe sepsis likely secondary to UTI. The UTI is likely related to the pt's chronic foley. Urine cx grew acinetobacter calcoaceticus/baumannii complex. Pt was d/c home on po cipro as per urine cx results to complete the course. Pt was also found to have AKI on CKD and Cr was trending down and was 1.50 on the day of d/c. PT evaluated the pt and recommended home health. Home health was already seeing the pt and will restart at d/c as per CM.   Discharge Diagnoses:  Principal Problem:   Sepsis due to gram-negative UTI [facility]) Active Problems:   Severe sepsis ([place])   Acute kidney injury superimposed on chronic kidney disease ([place])   Essential hypertension   Bipolar I disorder, most recent episode mixed ([place])   History of DVT (deep vein thrombosis)   TBI (traumatic brain injury) ([place])   Sepsis secondary to UTI ([place])   Type 2 diabetes mellitus with chronic kidney disease, without long-term current use of insulin ([place])   Dyslipidemia  Likely UTI: continue on IV ceftriaxone while inpatient and d/c home on po cipro. Abxs ordered w/o collecting urine cx. Urine cx is growing acinetobacter calcoaceticus/baumannii complex. UTI is likely related to  chronic foley     Severe sepsis: met criteria w/ fever, tachycardia, tachypnea, elevated lactic acid, hypotension, AKI & likely UTI. Continue on IV abxs. Severe sepsis resolved    AKI on CKDIIIa: Cr is trending down daily. Avoid nephrotoxic meds   ACD: likely secondary to CKD. Will transfuse if Hb < 7.0    HTN: restart amlodipine, ARB   HLD: continue on statin    DM2: pretty well controlled, HbA1c 6.8 in 7/23. Continue on SSI w/ accuchecks    Hx of TBI: continue on home dose of namenda. Hx  of chronic foley & colostomy    Hx of DVT: continue on eliquis   Bipolar I disorder: unknown type or severity. Continue on home dose of seroquel    Obesity: BMI 36.9. Complicates overall care & prognosis   Discharge Instructions  Discharge Instructions     Diet - low sodium heart healthy   Complete by: As directed    Diet Carb Modified   Complete by: As directed    Discharge instructions   Complete by: As directed    F/u w/ PCP in 1 week.   Increase activity slowly   Complete by: As directed    No wound care   Complete by: As directed       Allergies as of 03/23/2022   No Known Allergies      Medication List     STOP taking these medications    ascorbic acid 500 MG tablet Commonly known as: VITAMIN C   zinc sulfate 220 (50 Zn) MG capsule       TAKE these medications    amLODipine-olmesartan 5-20 MG tablet Commonly known as: AZOR Take 1 tablet by mouth daily.   ciprofloxacin 500 MG tablet Commonly known as: Cipro Take 1 tablet (500 mg total) by mouth 2 (two) times daily for 7 days.   cyanocobalamin 1000 MCG tablet Commonly known as: VITAMIN B12 Take 3,000 mcg by mouth daily.   Eliquis 5 MG Tabs tablet Generic drug: apixaban Take 5 mg by mouth 2 (two) times daily.   fish oil-omega-3 fatty acids 1000 MG capsule SMARTSIG:1 Capsule(s) By Mouth Daily   glipiZIDE 2.5 MG 24 hr tablet Commonly known as: GLUCOTROL XL Take 1 tablet (2.5 mg total) by mouth daily with breakfast.   memantine 10 MG tablet Commonly known as: NAMENDA Take 10 mg by mouth 2 (two) times daily.   Oxcarbazepine 300 MG tablet Commonly known as: TRILEPTAL Take 1 tablet (300 mg total) by mouth 2 (two) times daily.   QUEtiapine 50 MG tablet Commonly known as: SEROQUEL TAKE 1/2 TO 1 (ONE-HALF TO ONE) TABLET BY MOUTH ONCE DAILY AS NEEDED FOR  AGITATION What changed: Another medication with the same name was removed. Continue taking this medication, and follow the directions you see  here.   rosuvastatin 20 MG tablet Commonly known as: CRESTOR Take 20 mg by mouth daily.   vitamin D3 25 MCG tablet Commonly known as: CHOLECALCIFEROL Take 1 tablet (1,000 Units total) by mouth daily.        No Known Allergies  Consultations:    Procedures/Studies: DG Chest 2 View  Result Date: 03/20/2022 CLINICAL DATA:  Suspected sepsis.  Fall. EXAM: CHEST - 2 VIEW COMPARISON:  Chest 02/22/2022 FINDINGS: Cardiac enlargement. Mild vascular congestion without edema or effusion. Mild bibasilar airspace disease likely atelectasis. IMPRESSION: Mild vascular congestion without edema Mild bibasilar atelectasis. Electronically Signed   By: Franchot Gallo M.D.   On: 03/20/2022 18:19  US RENAL  Result Date: 02/25/2022 CLINICAL DATA:  Hydronephrosis EXAM: RENAL / URINARY TRACT ULTRASOUND COMPLETE COMPARISON:  CT scan of the abdomen and pelvis January 12, 2022 FINDINGS: Right Kidney: Renal measurements: 11.4 x 5.1 by 4.6 cm = volume: 140 mL. Mild hydronephrosis. Left Kidney: Renal measurements: 11.2 x 5.4 x 5.1 cm = volume: 160 mL. Echogenicity within normal limits. No mass or hydronephrosis visualized. Bladder: Decompressed with a Foley catheter. Other: None. IMPRESSION: 1. Mild hydronephrosis on the right. 2. The left kidney is normal. 3. The bladder are a Foley catheter. Electronically Signed   By: Dorise Bullion III M.D.   On: 02/25/2022 13:58   DG Chest 2 View  Result Date: 02/22/2022 CLINICAL DATA:  Suspected sepsis. EXAM: CHEST - 2 VIEW COMPARISON:  Chest x-ray 01/12/2022. FINDINGS: No consolidation. No visible pleural effusion or pneumothorax. Possible enlargement of the pulmonary arteries. Cardiomediastinal silhouette is otherwise within normal limits. No evidence of acute osseous abnormality. Polyarticular degenerative change. IMPRESSION: 1. No consolidation. 2. Possible enlargement of the pulmonary arteries, as can be seen with pulmonary arterial hypertension. Electronically Signed   By:  Margaretha Sheffield M.D.   On: 02/22/2022 12:53   (Echo, Carotid, EGD, Colonoscopy, ERCP)    Subjective: Pt c/o fatigue    Discharge Exam: Vitals:   03/23/22 0446 03/23/22 0832  BP: (!) 141/82 (!) 169/94  Pulse: 64 63  Resp: 20 16  Temp: (!) 97.5 F (36.4 C)   SpO2: 100% 99%   Vitals:   03/22/22 1622 03/22/22 2018 03/23/22 0446 03/23/22 0832  BP: (!) 162/89 (!) 156/83 (!) 141/82 (!) 169/94  Pulse: 67 62 64 63  Resp: _0 Temp: 98.1 F (36.7 C) 98.6 F (37 C) (!) 97.5 F (36.4 C)   TempSrc:   Oral   SpO2: 100% 100% 100% 99%  Weight:      Height:        General: Pt is alert, awake, not in acute distress Cardiovascular: S1/S2 +, no rubs, no gallops Respiratory: CTA bilaterally, no wheezing, no rhonchi Abdominal: Soft, NT, obese, bowel sounds + Extremities: no cyanosis    The results of significant diagnostics from this hospitalization (including imaging, microbiology, ancillary and laboratory) are listed below for reference.     Microbiology: Recent Results (from the past 240 hour(s))  SARS Coronavirus 2 by RT PCR (hospital order, performed in [facility] hospital lab) *cepheid single result test* Anterior Nasal Swab     Status: None   Collection Time: 03/20/22  5:50 PM   Specimen: Anterior Nasal Swab  Result Value Ref Range Status   SARS Coronavirus 2 by RT PCR NEGATIVE NEGATIVE Final    Comment: (NOTE) SARS-CoV-2 target nucleic acids are NOT DETECTED.  The SARS-CoV-2 RNA is generally detectable in upper and lower respiratory specimens during the acute phase of infection. The lowest concentration of SARS-CoV-2 viral copies this assay can detect is 250 copies / mL. A negative result does not preclude SARS-CoV-2 infection and should not be used as the sole basis for treatment or other patient management decisions.  A negative result may occur with improper specimen collection / handling, submission of specimen other than nasopharyngeal swab, presence of  viral mutation(s) within the areas targeted by this assay, and inadequate number of viral copies (<250 copies / mL). A negative result must be combined with clinical observations, patient history, and epidemiological information.  Fact Sheet for Patients:   https://www.patel.info/  Fact Sheet for Healthcare Providers: https://hall.com/  This test is not yet approved or  cleared by the [place] and has been authorized for detection and/or diagnosis of SARS-CoV-2 by FDA under an Emergency Use Authorization (EUA).  This EUA will remain in effect (meaning this test can be used) for the duration of the COVID-19 declaration under Section 564(b)(1) of the Act, 21 U.S.C. section 360bbb-3(b)(1), unless the authorization is terminated or revoked sooner.  Performed at [facility], [address] [place], [place] [address]   Culture, blood (Routine x 2)     Status: None (Preliminary result)   Collection Time: 03/20/22  5:57 PM   Specimen: BLOOD LEFT ARM  Result Value Ref Range Status   Specimen Description BLOOD LEFT ARM  Final   Special Requests   Final    BOTTLES DRAWN AEROBIC AND ANAEROBIC Blood Culture adequate volume   Culture   Final    NO GROWTH 3 DAYS Performed at [facility], [address], [place], [place] [address]    Report Status PENDING  Incomplete  Culture, blood (Routine x 2)     Status: None (Preliminary result)   Collection Time: 03/20/22  7:09 PM   Specimen: BLOOD  Result Value Ref Range Status   Specimen Description BLOOD RIGHT ANTECUBITAL  Final   Special Requests   Final    BOTTLES DRAWN AEROBIC AND ANAEROBIC Blood Culture results may not be optimal due to an excessive volume of blood received in culture bottles   Culture   Final    NO GROWTH 3 DAYS Performed at [facility], [address] [place], [place] [address]    Report Status PENDING  Incomplete  Remove and replace  urinary cath (placed > 5 days) then obtain urine culture from new indwelling urinary catheter.     Status: Abnormal   Collection Time: 03/21/22  8:34 AM   Specimen: Urine, Catheterized  Result Value Ref Range Status   Specimen Description   Final    URINE, CATHETERIZED Performed at [facility], [address] [place], [place] [address]    Special Requests   Final    NONE Performed at [facility], [address] [place], [place] [address]    Culture (A)  Final    20,000 COLONIES/mL ACINETOBACTER CALCOACETICUS/BAUMANNII COMPLEX   Report Status 03/23/2022 FINAL  Final   Organism ID, Bacteria ACINETOBACTER CALCOACETICUS/BAUMANNII COMPLEX (A)  Final      Susceptibility   Acinetobacter calcoaceticus/baumannii complex - MIC*    CEFTAZIDIME 4 SENSITIVE Sensitive     CIPROFLOXACIN <=0.25 SENSITIVE Sensitive     GENTAMICIN <=1 SENSITIVE Sensitive     IMIPENEM <=0.25 SENSITIVE Sensitive     PIP/TAZO <=4 SENSITIVE Sensitive     TRIMETH/SULFA <=20 SENSITIVE Sensitive     AMPICILLIN/SULBACTAM <=2 SENSITIVE Sensitive     * 20,000 COLONIES/mL ACINETOBACTER CALCOACETICUS/BAUMANNII COMPLEX     Labs: BNP (last 3 results) Recent Labs    07/28/21 0426 02/22/22 1203  BNP 22.3 91.7   Basic Metabolic Panel: Recent Labs  Lab 03/20/22 1758 03/21/22 0544 03/22/22 0426 03/23/22 0423  NA 133* 135 135 139  K 4.5 4.1 3.8 4.2  CL 107 109 108 114*  CO2 18* 21* 21* 20*  GLUCOSE 214* 125* 156* 139*  BUN 33* 30* 22 20  CREATININE 2.10* 1.83* 1.72* 1.50*  CALCIUM 8.4* 8.2* 8.2* 8.3*   Liver Function Tests: Recent Labs  Lab 03/20/22 1758  AST 21  ALT 14  ALKPHOS 111  BILITOT  0.5  PROT 8.4*  ALBUMIN 3.1*   No results for input(s): "LIPASE", "AMYLASE" in the last 168 hours. No results for input(s): "AMMONIA" in the last 168 hours. CBC: Recent Labs  Lab 03/20/22 1758 03/21/22 0544 03/22/22 0426 03/23/22 0423  WBC 7.2 6.4 4.5 3.5*  NEUTROABS 5.5  --   --   --    HGB 8.9* 8.2* 7.9* 7.9*  HCT 30.7* 27.6* 25.3* 25.8*  MCV 84.1 83.4 82.4 82.4  PLT 219 197 177 161   Cardiac Enzymes: No results for input(s): "CKTOTAL", "CKMB", "CKMBINDEX", "TROPONINI" in the last 168 hours. BNP: Invalid input(s): "POCBNP" CBG: Recent Labs  Lab 03/22/22 0835 03/22/22 1216 03/22/22 1621 03/22/22 2024 03/23/22 0953  GLUCAP 117* 158* 148* 138* 162*   D-Dimer No results for input(s): "DDIMER" in the last 72 hours. Hgb A1c No results for input(s): "HGBA1C" in the last 72 hours. Lipid Profile No results for input(s): "CHOL", "HDL", "LDLCALC", "TRIG", "CHOLHDL", "LDLDIRECT" in the last 72 hours. Thyroid function studies No results for input(s): "TSH", "T4TOTAL", "T3FREE", "THYROIDAB" in the last 72 hours.  Invalid input(s): "FREET3" Anemia work up No results for input(s): "VITAMINB12", "FOLATE", "FERRITIN", "TIBC", "IRON", "RETICCTPCT" in the last 72 hours. Urinalysis    Component Value Date/Time   COLORURINE YELLOW 03/20/2022 1921   APPEARANCEUR CLEAR 03/20/2022 1921   APPEARANCEUR Cloudy (A) 08/09/2021 1559   LABSPEC 1.015 03/20/2022 1921   PHURINE 6.0 03/20/2022 1921   GLUCOSEU NEGATIVE 03/20/2022 1921   HGBUR MODERATE (A) 03/20/2022 1921   BILIRUBINUR NEGATIVE 03/20/2022 1921   BILIRUBINUR Negative 08/09/2021 [place] NEGATIVE 03/20/2022 1921   PROTEINUR >300 (A) 03/20/2022 1921   NITRITE POSITIVE (A) 03/20/2022 1921   LEUKOCYTESUR SMALL (A) 03/20/2022 1921   Sepsis Labs Recent Labs  Lab 03/20/22 1758 03/21/22 0544 03/22/22 0426 03/23/22 0423  WBC 7.2 6.4 4.5 3.5*   Microbiology Recent Results (from the past 240 hour(s))  SARS Coronavirus 2 by RT PCR (hospital order, performed in [facility] hospital lab) *cepheid single result test* Anterior Nasal Swab     Status: None   Collection Time: 03/20/22  5:50 PM   Specimen: Anterior Nasal Swab  Result Value Ref Range Status   SARS Coronavirus 2 by RT PCR NEGATIVE NEGATIVE Final     Comment: (NOTE) SARS-CoV-2 target nucleic acids are NOT DETECTED.  The SARS-CoV-2 RNA is generally detectable in upper and lower respiratory specimens during the acute phase of infection. The lowest concentration of SARS-CoV-2 viral copies this assay can detect is 250 copies / mL. A negative result does not preclude SARS-CoV-2 infection and should not be used as the sole basis for treatment or other patient management decisions.  A negative result may occur with improper specimen collection / handling, submission of specimen other than nasopharyngeal swab, presence of viral mutation(s) within the areas targeted by this assay, and inadequate number of viral copies (<250 copies / mL). A negative result must be combined with clinical observations, patient history, and epidemiological information.  Fact Sheet for Patients:   https://www.patel.info/  Fact Sheet for Healthcare Providers: https://hall.com/  This test is not yet approved or  cleared by the [place] FDA and has been authorized for detection and/or diagnosis of SARS-CoV-2 by FDA under an Emergency Use Authorization (EUA).  This EUA will remain in effect (meaning this test can be used) for the duration of the COVID-19 declaration under Section 564(b)(1) of the Act, 21 U.S.C. section 360bbb-3(b)(1), unless the authorization is terminated or  revoked sooner.  Performed at [facility], [address] [place], [place] [address]   Culture, blood (Routine x 2)     Status: None (Preliminary result)   Collection Time: 03/20/22  5:57 PM   Specimen: BLOOD LEFT ARM  Result Value Ref Range Status   Specimen Description BLOOD LEFT ARM  Final   Special Requests   Final    BOTTLES DRAWN AEROBIC AND ANAEROBIC Blood Culture adequate volume   Culture   Final    NO GROWTH 3 DAYS Performed at [facility], [address] [place], [place] [address]    Report Status  PENDING  Incomplete  Culture, blood (Routine x 2)     Status: None (Preliminary result)   Collection Time: 03/20/22  7:09 PM   Specimen: BLOOD  Result Value Ref Range Status   Specimen Description BLOOD RIGHT ANTECUBITAL  Final   Special Requests   Final    BOTTLES DRAWN AEROBIC AND ANAEROBIC Blood Culture results may not be optimal due to an excessive volume of blood received in culture bottles   Culture   Final    NO GROWTH 3 DAYS Performed at [facility], [address] [place], [place] [address]    Report Status PENDING  Incomplete  Remove and replace urinary cath (placed > 5 days) then obtain urine culture from new indwelling urinary catheter.     Status: Abnormal   Collection Time: 03/21/22  8:34 AM   Specimen: Urine, Catheterized  Result Value Ref Range Status   Specimen Description   Final    URINE, CATHETERIZED Performed at [facility], [address] [place], [place] [address]    Special Requests   Final    NONE Performed at [facility], [place], [place] [address]    Culture (A)  Final    20,000 COLONIES/mL ACINETOBACTER CALCOACETICUS/BAUMANNII COMPLEX   Report Status 03/23/2022 FINAL  Final   Organism ID, Bacteria ACINETOBACTER CALCOACETICUS/BAUMANNII COMPLEX (A)  Final      Susceptibility   Acinetobacter calcoaceticus/baumannii complex - MIC*    CEFTAZIDIME 4 SENSITIVE Sensitive     CIPROFLOXACIN <=0.25 SENSITIVE Sensitive     GENTAMICIN <=1 SENSITIVE Sensitive     IMIPENEM <=0.25 SENSITIVE Sensitive     PIP/TAZO <=4 SENSITIVE Sensitive     TRIMETH/SULFA <=20 SENSITIVE Sensitive     AMPICILLIN/SULBACTAM <=2 SENSITIVE Sensitive     * 20,000 COLONIES/mL ACINETOBACTER CALCOACETICUS/BAUMANNII COMPLEX     Time coordinating discharge: Over 30 minutes  SIGNED:   Wyvonnia Dusky, MD  [facility] 03/23/2022, 11:09 AM Pager   If 7PM-7AM, please contact night-coverage [email or web address]

## 2022-03-23 NOTE — TOC Transition Note (Signed)
Transition of Care [facility]) - CM/SW Discharge Note   Patient Details  Name: [name] MRN: [id number] Date of Birth: Oct 30, 1955  [facility]) CM/SW Contact:  Valente David, RN Phone Number: 03/23/2022, 12:07 PM   Clinical Narrative:     Patient admitted from home, lives with wife [name].  PCP is Dr. Johnn Hai, receives medication from [place].  Confirms that patient was active with [facility] for home health prior to admission, agrees to restart services.  Cheryl notified of discharge and will follow up with patient/wife directly.    Final next level of care: [place] Barriers to Discharge: Barriers Resolved   Patient Goals and CMS Choice Patient states their goals for this hospitalization and ongoing recovery are:: Home with home health CMS Medicare.gov Compare Post Acute Care list provided to:: Patient Represenative (must comment) (Wife, [name]) Choice offered to / list presented to : Spouse  Discharge Placement                       Discharge Plan and Services   Discharge Planning Services: CM Consult Post Acute Care Choice: Home Health                    HH Arranged: PT, OT [facility] Agency: [place] Date [place]: 03/23/22 Time [place]: 1207 Representative spoke with at [place]: [place] ([place]) Interventions     Readmission Risk Interventions    02/23/2022    3:44 PM 01/14/2022    3:23 PM 01/07/2022   10:14 AM  Readmission Risk Prevention Plan  Transportation Screening Complete Complete Complete  Medication Review Press photographer)  Complete Complete  PCP or Specialist appointment within 3-5 days of discharge Complete  Complete  HRI or [place] Complete Complete Complete  SW Recovery Care/Counseling Consult Complete  Complete  Palliative Care Screening Not Applicable Not Applicable Not [place] Not Applicable   Patient Refused

## 2022-03-23 NOTE — Plan of Care (Signed)

## 2022-03-25 LAB — CULTURE, BLOOD (ROUTINE X 2)
Culture: NO GROWTH
Special Requests: ADEQUATE

## 2022-03-27 LAB — CULTURE, BLOOD (ROUTINE X 2)

## 2022-04-04 ENCOUNTER — Encounter: Payer: Medicare Other | Attending: Physician Assistant | Admitting: Physician Assistant

## 2022-04-04 DIAGNOSIS — L98412 Non-pressure chronic ulcer of buttock with fat layer exposed: Secondary | ICD-10-CM | POA: Insufficient documentation

## 2022-04-04 DIAGNOSIS — E1140 Type 2 diabetes mellitus with diabetic neuropathy, unspecified: Secondary | ICD-10-CM | POA: Insufficient documentation

## 2022-04-04 DIAGNOSIS — L89613 Pressure ulcer of right heel, stage 3: Secondary | ICD-10-CM | POA: Insufficient documentation

## 2022-04-04 DIAGNOSIS — E11622 Type 2 diabetes mellitus with other skin ulcer: Secondary | ICD-10-CM | POA: Diagnosis present

## 2022-04-04 DIAGNOSIS — Z7901 Long term (current) use of anticoagulants: Secondary | ICD-10-CM | POA: Diagnosis not present

## 2022-04-04 DIAGNOSIS — I10 Essential (primary) hypertension: Secondary | ICD-10-CM | POA: Diagnosis not present

## 2022-04-04 DIAGNOSIS — L89154 Pressure ulcer of sacral region, stage 4: Secondary | ICD-10-CM | POA: Diagnosis not present

## 2022-04-04 DIAGNOSIS — L89623 Pressure ulcer of left heel, stage 3: Secondary | ICD-10-CM | POA: Diagnosis not present

## 2022-04-04 NOTE — Progress Notes (Addendum)
ISAM, ZANGER ([id number]) Visit Report for 04/04/2022 Arrival Information Details Patient Name: [name], [name] Date of Service: 04/04/2022 8:30 AM Medical Record Number: [id number] Patient Account Number: [id number] Date of Birth/Sex: 09-Nov-1955 (66 y.o. M) Treating RN: Cornell Barman Primary Care Roshun Klingensmith: [name] Other Clinician: Referring Kiela Shisler: [name] Treating Zema Lizardo/Extender: Skipper Cliche in Treatment: [name] Visit Information History Since Last Visit Added or deleted any medications: No Patient Arrived: Wheel Chair Pain Present Now: No Arrival Time: 08:37 Transfer Assistance: Manual Patient Identification Verified: Yes Secondary Verification Process Completed: Yes Patient Requires Transmission-Based No Precautions: Patient Has Alerts: Yes Patient Alerts: Patient on Blood Thinner Electronic Signature(s) Signed: 04/05/2022 3:11:48 PM By: [name], BSN, RN, CWS, Kim RN, BSN Entered By: [name], BSN, RN, CWS, Kim on 04/04/2022 08:37:59 [name], [name] ([id number]) -------------------------------------------------------------------------------- Encounter Discharge Information Details Patient Name: [name] Date of Service: 04/04/2022 8:30 AM Medical Record Number: [id number] Patient Account Number: [id number] Date of Birth/Sex: 07/21/1955 (66 y.o. M) Treating RN: Cornell Barman Primary Care Jomar Denz: [name] Other Clinician: Referring Manessa Buley: [name] Treating Keleigh Kazee/Extender: Skipper Cliche in Treatment: [name] Encounter Discharge Information Items Post Procedure Vitals Discharge Condition: Stable Temperature (F): 97.8 Ambulatory Status: Wheelchair Pulse (bpm): 64 Discharge Destination: Home Respiratory Rate (breaths/min): 16 Transportation: Private Auto Blood Pressure (mmHg): 170/94 Accompanied By: wife Schedule Follow-up Appointment: No Clinical Summary of Care: Electronic Signature(s) Signed:  04/05/2022 3:11:48 PM By: [name], BSN, RN, CWS, Kim RN, BSN Entered By: [name], BSN, RN, CWS, Kim on 04/04/2022 09:22:50 [name] ([id number]) -------------------------------------------------------------------------------- Lower Extremity Assessment Details Patient Name: [name] Date of Service: 04/04/2022 8:30 AM Medical Record Number: [id number] Patient Account Number: [id number] Date of Birth/Sex: 04-Aug-1955 (66 y.o. M) Treating RN: Cornell Barman Primary Care Samona Chihuahua: [name] Other Clinician: Referring Danni Shima: [name] Treating Priyansh Pry/Extender: Skipper Cliche in Treatment: [name] Vascular Assessment Pulses: Dorsalis Pedis Palpable: [Left:Yes] [Right:Yes] Electronic Signature(s) Signed: 04/05/2022 3:11:48 PM By: [name], BSN, RN, CWS, Kim RN, BSN Entered By: [name], BSN, RN, CWS, Kim on 04/04/2022 08:51:11 [name], [name] ([id number]) -------------------------------------------------------------------------------- Multi Wound Chart Details Patient Name: [name] Date of Service: 04/04/2022 8:30 AM Medical Record Number: [id number] Patient Account Number: [id number] Date of Birth/Sex: Apr 06, 1956 (66 y.o. M) Treating RN: Cornell Barman Primary Care Ethelbert Thain: [name] Other Clinician: Referring Xitlalic Maslin: [name] Treating Cuahutemoc Attar/Extender: Skipper Cliche in Treatment: [name] Vital Signs Height(in): 72 Pulse(bpm): 89 Weight(lbs): 3 Blood Pressure(mmHg): 170/94 Body Mass Index(BMI): 45 Temperature(F): 97.8 Respiratory Rate(breaths/min): 18 Photos: [N/A:N/A] Wound Location: Right Calcaneus Sacrum N/A Wounding Event: Pressure Injury Pressure Injury N/A Primary Etiology: Pressure Ulcer Pressure Ulcer N/A Comorbid History: Anemia, Sleep Apnea, Coronary Anemia, Sleep Apnea, Coronary N/A Artery Disease, Deep Vein Artery Disease, Deep Vein Thrombosis, Hypertension, Type II Thrombosis, Hypertension, Type  II Diabetes, History of pressure Diabetes, History of pressure wounds, Neuropathy wounds, Neuropathy Date Acquired: 07/01/2021 07/01/2021 N/A Weeks of Treatment: [name] [name] N/A Wound Status: Open Open N/A Wound Recurrence: No No N/A Measurements L x W x D (cm) 4x4.2x0.3 2.5x1x4.1 N/A Area (cm) : 13.195 1.963 N/A Volume (cm) : 3.958 8.05 N/A % Reduction in Area: -218.80% 37.50% N/A % Reduction in Volume: -856.00% 14.60% N/A Classification: Category/Stage III Category/Stage IV N/A Exudate Amount: Medium Medium N/A Exudate Type: Serosanguineous Serosanguineous N/A Exudate Color: red, brown red, brown N/A Granulation Amount: Medium (34-66%) Large (67-100%) N/A Granulation Quality: Red, Pink Red N/A Necrotic Amount: Medium (34-66%) None Present (0%) N/A Exposed Structures: Fat Layer (Subcutaneous Tissue): Fat Layer (Subcutaneous Tissue): N/A Yes Yes Fascia: No Fascia:  No Tendon: No Tendon: No Muscle: No Muscle: No Joint: No Joint: No Bone: No Bone: No Epithelialization: None None N/A Treatment Notes Electronic Signature(s) Signed: 04/05/2022 3:11:48 PM By: [name], BSN, RN, CWS, Kim RN, BSN Entered By: [name], BSN, RN, CWS, Kim on 04/04/2022 09:02:52 [name], [name] ([id number]) -------------------------------------------------------------------------------- Multi-Disciplinary Care Plan Details Patient Name: [name] Date of Service: 04/04/2022 8:30 AM Medical Record Number: [id number] Patient Account Number: [id number] Date of Birth/Sex: 06-Sep-1955 (66 y.o. M) Treating RN: Cornell Barman Primary Care Bekki Tavenner: [name] Other Clinician: Referring Vaneta Hammontree: [name] Treating Christiana Gurevich/Extender: Skipper Cliche in Treatment: [name] Active Inactive Wound/Skin Impairment Nursing Diagnoses: Knowledge deficit related to ulceration/compromised skin integrity Goals: Patient/caregiver will verbalize understanding of skin care regimen Date Initiated:  11/22/2021 Target Resolution Date: 02/22/2022 Goal Status: Active Ulcer/skin breakdown will have a volume reduction of 30% by week 4 Date Initiated: 11/22/2021 Date Inactivated: 01/10/2022 Target Resolution Date: 12/23/2021 Goal Status: Unmet Unmet Reason: comorbities Ulcer/skin breakdown will have a volume reduction of 50% by week 8 Date Initiated: 11/22/2021 Target Resolution Date: 01/22/2022 Goal Status: Active Ulcer/skin breakdown will have a volume reduction of 80% by week 12 Date Initiated: 11/22/2021 Target Resolution Date: 02/22/2022 Goal Status: Active Ulcer/skin breakdown will heal within 14 weeks Date Initiated: 11/22/2021 Target Resolution Date: 03/25/2022 Goal Status: Active Interventions: Assess patient/caregiver ability to obtain necessary supplies Assess patient/caregiver ability to perform ulcer/skin care regimen upon admission and as needed Assess ulceration(s) every visit Notes: Electronic Signature(s) Signed: 04/05/2022 3:11:48 PM By: [name], BSN, RN, CWS, Kim RN, BSN Entered By: [name], BSN, RN, CWS, Kim on 04/04/2022 09:02:44 [name], [name] ([id number]) -------------------------------------------------------------------------------- Pain Assessment Details Patient Name: [name] Date of Service: 04/04/2022 8:30 AM Medical Record Number: [id number] Patient Account Number: [id number] Date of Birth/Sex: Dec 04, 1955 (66 y.o. M) Treating RN: Cornell Barman Primary Care Devone Tousley: [name] Other Clinician: Referring Manette Doto: [name] Treating Lafe Clerk/Extender: Skipper Cliche in Treatment: [name] Active Problems Location of Pain Severity and Description of Pain Patient Has Paino No Site Locations Pain Management and Medication Current Pain Management: Notes Patient denies pain at this time. Electronic Signature(s) Signed: 04/05/2022 3:11:48 PM By: [name], BSN, RN, CWS, Kim RN, BSN Entered By: [name], BSN, RN, CWS, Kim on 04/04/2022  08:38:40 [name] ([id number]) -------------------------------------------------------------------------------- Patient/Caregiver Education Details Patient Name: [name] Date of Service: 04/04/2022 8:30 AM Medical Record Number: [id number] Patient Account Number: [id number] Date of Birth/Gender: [birth year] (66 y.o. M) Treating RN: Cornell Barman Primary Care Physician: [name] Other Clinician: Referring Physician: [name] Treating Physician/Extender: Skipper Cliche in Treatment: [name] Education Assessment Education Provided To: Patient Education Topics Provided Pressure: Handouts: Pressure Ulcers: Care and Offloading, Other: sleep on bed with air mattress Methods: Explain/Verbal Responses: State content correctly Wound/Skin Impairment: Handouts: Caring for Your Ulcer, Other: continue wound care as prescribed. Methods: Demonstration, Explain/Verbal Responses: State content correctly Electronic Signature(s) Signed: 04/05/2022 3:11:48 PM By: [name], BSN, RN, CWS, Kim RN, BSN Entered By: [name], BSN, RN, CWS, Kim on 04/04/2022 09:22:06 [name] ([id number]) -------------------------------------------------------------------------------- Wound Assessment Details Patient Name: [name] Date of Service: 04/04/2022 8:30 AM Medical Record Number: [id number] Patient Account Number: [id number] Date of Birth/Sex: 1955-08-30 (66 y.o. M) Treating RN: Cornell Barman Primary Care Tennis Mckinnon: [name] Other Clinician: Referring Nero Sawatzky: [name] Treating Mina Babula/Extender: [name] Wound Status Wound Number: 5 Primary Pressure Ulcer Etiology: Wound Location: Right Calcaneus Wound Open Wounding Event: Pressure Injury Status: Date Acquired: 07/01/2021 Comorbid Anemia, Sleep Apnea, Coronary Artery Disease, Deep Vein  Weeks Of Treatment: [name] History: Thrombosis, Hypertension, Type II  Diabetes, History of Clustered Wound: No pressure wounds, Neuropathy Photos Wound Measurements Length: (cm) 4 % Reducti Width: (cm) 4.2 % Reducti Depth: (cm) 0.3 Epithelia Area: (cm) 13.195 Volume: (cm) 3.958 on in Area: -218.8% on in Volume: -856% lization: None Wound Description Classification: Category/Stage III Foul Odor Exudate Amount: Medium Slough/Fi Exudate Type: Serosanguineous Exudate Color: red, brown After Cleansing: No brino Yes Wound Bed Granulation Amount: Medium (34-66%) Exposed Structure Granulation Quality: Red, Pink Fascia Exposed: No Necrotic Amount: Medium (34-66%) Fat Layer (Subcutaneous Tissue) Exposed: Yes Necrotic Quality: Adherent Slough Tendon Exposed: No Muscle Exposed: No Joint Exposed: No Bone Exposed: No Treatment Notes Wound #5 (Calcaneus) Wound Laterality: Right Cleanser Peri-Wound Care Topical Primary Dressing [name], [name] ([id number]) Hydrofera Blue Ready Transfer Foam, 2.5x2.5 (in/in) Discharge Instruction: Apply Hydrofera Blue Ready to wound bed as directed Secondary Dressing ABD Pad 5x9 (in/in) Discharge Instruction: Cover with ABD pad Secured With Medipore Tape - 62M Medipore H Soft Cloth Surgical Tape, 2x2 (in/yd) Conform 4'' - Conforming Stretch Gauze Bandage 4x75 (in/in) Discharge Instruction: Apply as directed Compression Wrap Compression Stockings Add-Ons Electronic Signature(s) Signed: 04/05/2022 3:11:48 PM By: [name], BSN, RN, CWS, Kim RN, BSN Entered By: [name], BSN, RN, CWS, Kim on 04/04/2022 08:49:07 [name], [name] ([id number]) -------------------------------------------------------------------------------- Wound Assessment Details Patient Name: [name] Date of Service: 04/04/2022 8:30 AM Medical Record Number: [id number] Patient Account Number: [id number] Date of Birth/Sex: 1955-08-17 (66 y.o. M) Treating RN: Cornell Barman Primary Care Lin Hackmann: [name] Other  Clinician: Referring Marcellene Shivley: [name] Treating Rhondalyn Clingan/Extender: [name] Wound Status Wound Number: 7 Primary Pressure Ulcer Etiology: Wound Location: Sacrum Wound Open Wounding Event: Pressure Injury Status: Date Acquired: 07/01/2021 Comorbid Anemia, Sleep Apnea, Coronary Artery Disease, Deep Vein Weeks Of Treatment: [name] History: Thrombosis, Hypertension, Type II Diabetes, History of Clustered Wound: No pressure wounds, Neuropathy Photos Wound Measurements Length: (cm) 2.5 % Reduct Width: (cm) 1 % Reduct Depth: (cm) 4.1 Epitheli Area: (cm) 1.963 Volume: (cm) 8.05 ion in Area: 37.5% ion in Volume: 14.6% alization: None Wound Description Classification: Category/Stage IV Foul Odo Exudate Amount: Medium Slough/F Exudate Type: Serosanguineous Exudate Color: red, brown r After Cleansing: No ibrino Yes Wound Bed Granulation Amount: Large (67-100%) Exposed Structure Granulation Quality: Red Fascia Exposed: No Necrotic Amount: None Present (0%) Fat Layer (Subcutaneous Tissue) Exposed: Yes Tendon Exposed: No Muscle Exposed: No Joint Exposed: No Bone Exposed: No Treatment Notes Wound #7 (Sacrum) Cleanser Peri-Wound Care Topical calmoseptine [name], [name] ([id number]) Discharge Instruction: apply to excorated areas Primary Dressing Hydrofera Blue Ready Transfer Foam, 4x5 (in/in) Discharge Instruction: spiral hydrofera blue to lightly pack into wound, then cut to cover wound Secondary Dressing ABD Pad 5x9 (in/in) Discharge Instruction: Cover with ABD pad Secured With Medipore Tape - 62M Medipore H Soft Cloth Surgical Tape, 2x2 (in/yd) Compression Wrap Compression Stockings Add-Ons Electronic Signature(s) Signed: 04/05/2022 3:11:48 PM By: [name], BSN, RN, CWS, Kim RN, BSN Entered By: [name], BSN, RN, CWS, Kim on 04/04/2022 08:50:14 [name], [name]  ([id number]) -------------------------------------------------------------------------------- Allison Details Patient Name: [name] Date of Service: 04/04/2022 8:30 AM Medical Record Number: [id number] Patient Account Number: [id number] Date of Birth/Sex: Jan 01, 1956 (66 y.o. M) Treating RN: Cornell Barman Primary Care Hiroyuki Ozanich: [name] Other Clinician: Referring Kathee Tumlin: [name] Treating Chamari Cutbirth/Extender: Skipper Cliche in Treatment: [name] Vital Signs Time Taken: 08:38 Temperature (F): 97.8 Height (in): 66 Pulse (bpm): 64 Weight (lbs): 279 Respiratory Rate (breaths/min): 18 Body Mass Index (BMI): 45 Blood Pressure (  mmHg): 170/94 Reference Range: 80 - 120 mg / dl Electronic Signature(s) Signed: 04/05/2022 3:11:48 PM By: [name], BSN, RN, CWS, Kim RN, BSN Entered By: [name], BSN, RN, CWS, Kim on 04/04/2022 [id number]

## 2022-04-05 NOTE — Progress Notes (Signed)
[name], [name] ([id number]) Visit Report for 04/04/2022 Chief Complaint Document Details Patient Name: [name], [name] Date of Service: 04/04/2022 8:30 AM Medical Record Number: [id number] Patient Account Number: [id number] Date of Birth/Sex: Jun 20, 1956 (65 y.o. M) Treating RN: Cornell Barman Primary Care Provider: [name] Other Clinician: Referring Provider: [name] Treating Provider/Extender: Skipper Cliche in Treatment: 19 Information Obtained from: Patient Chief Complaint Sacral, right gluteal, and bilateral heel ulcers Electronic Signature(s) Signed: 04/04/2022 8:37:50 AM By: [name] PA-C Entered By: [name] on 04/04/2022 08:37:50 Golubski, [name] ([id number]) -------------------------------------------------------------------------------- Debridement Details Patient Name: [name] Date of Service: 04/04/2022 8:30 AM Medical Record Number: [id number] Patient Account Number: [id number] Date of Birth/Sex: Nov 04, 1955 (65 y.o. M) Treating RN: Cornell Barman Primary Care Provider: [name] Other Clinician: Referring Provider: [name] Treating Provider/Extender: Skipper Cliche in Treatment: 19 Debridement Performed for Wound #5 Right Calcaneus Assessment: Performed By: Physician Tommie Sams., PA-C Debridement Type: Debridement Level of Consciousness (Pre- Awake and Alert procedure): Pre-procedure Verification/Time Out Yes - 09:03 Taken: Pain Control: Lidocaine Total Area Debrided (L x W): 4 (cm) x 4.2 (cm) = 16.8 (cm) Tissue and other material Viable, Non-Viable, Slough, Subcutaneous, Biofilm, Slough debrided: Level: Skin/Subcutaneous Tissue Debridement Description: Excisional Instrument: Curette Bleeding: Minimum Hemostasis Achieved: Pressure Response to Treatment: Procedure was tolerated well Level of Consciousness (Post- Awake and Alert procedure): Post Debridement Measurements of  Total Wound Length: (cm) 4 Stage: Category/Stage III Width: (cm) 4.2 Depth: (cm) 0.4 Volume: (cm) 5.278 Character of Wound/Ulcer Post Debridement: Stable Post Procedure Diagnosis Same as Pre-procedure Electronic Signature(s) Signed: 04/04/2022 2:50:44 PM By: [name] PA-C Signed: 04/05/2022 3:11:48 PM By: [name], BSN, RN, CWS, Kim RN, BSN Entered By: [name], BSN, RN, CWS, Kim on 04/04/2022 09:03:44 Mundorf, [name] ([id number]) -------------------------------------------------------------------------------- HPI Details Patient Name: [name] Date of Service: 04/04/2022 8:30 AM Medical Record Number: [id number] Patient Account Number: [id number] Date of Birth/Sex: 23-May-1956 (65 y.o. M) Treating RN: Cornell Barman Primary Care Provider: [name] Other Clinician: Referring Provider: [name] Treating Provider/Extender: Skipper Cliche in Treatment: 19 History of Present Illness HPI Description: 05/29/2021 this is a patient who presents today for initial inspection here in the clinic concerning wounds that he has over the right heel and the sacral region. Unfortunately the sacral wound is starting to spread off to the right gluteal location due to how he sits always leaning towards the right side in his chair. His wife is present she is the primary caregiver though she is not home with him all the time she does have to work. She does do an excellent job however it appears to be in trying to keep things under good control for him. The patient is not able to change positions himself nor walk by himself so he is pretty much at the mercy of the position he is putting when she is gone and this tends to be his chair which she sits and most of the day. Obviously this I think is the main culprit for what is going on currently. It was actually in January 2020 when the sacral wound started. It was in September 2022 when the wound started to spread more to the  right gluteal location. Subsequently in August 2022 is when he had been in a skilled nursing facility and the heel started to give him trouble as well. That does not seem to be doing nearly as poorly as the sacral region. He was hospitalized in October 2022 secondary to sepsis and this  was in regard to the foot and was sent to skilled nursing again he is now back at home. He did have a wound VAC for the sacral wound over the summer 2022 but being in and out of facilities this ended up getting sent back. The patient does have [facility] home health that comes out 1 time per week to help with care. His most recent hemoglobin A1c was 6.9 in August 2022. Patient's met past medical history includes generalized muscle weakness, bipolar disorder, diabetes mellitus type 2, hypertension, long-term use of anticoagulant therapy due to frequent blood clots/DVTs. He also has a history of traumatic brain injury. 07/24/2021 upon evaluation today patient appears to be doing decently well in regard to the pressure ulcer on the right heel as well as the sacral region. In general I think you are making some progress here which is great news. Overall the heel unfortunately had already closed previously when we saw him although it apparently reopened when he was working with physical therapy according to his wife. The area in the sacral region is doing well and looks clean there is still some depth here but I still think it would be difficult to wound VAC this region. His wife does an awesome job taking care of him. Is been so long since we have seen him because he has been in the hospital to be honest. 08/07/2021 upon evaluation today patient appears to be doing well at this time. Fortunately I do not see any signs of active infection locally or systemically at this time which is great news. No fevers, chills, nausea, vomiting, or diarrhea. Unfortunately after I saw him on the 24th he actually ended up in the hospital in the  27th due to being septic. This was not due to the wounds but after looking at records actually due to a UTI. Fortunately he is doing much better and very happy in that regard. I do not see any signs of infection locally nor systemically at this time. 08/21/2021 upon evaluation today patient appears to be doing well with regard to his wound. Fortunately I think that the sacral region is doing decently well at this point which is great news. With regard to the foot this also does have some slough noted but I feel like we are making progress here. He does have some irritation around the right upper thigh/gluteal region. I feel like it is more towards the thigh. Nonetheless this does appear to be pressure related he spends a lot of time sitting up his caregiver states he really will not get in the bed and stay there like he should. 09/04/2021 upon evaluation patient appears to be doing decently well today in regard to the wound on his heel as well as the sacral area. I am actually very pleased with both and how things are appearing currently. There does not appear to be any evidence of active infection I think that his caregiver is doing an awesome job with regard to the wound care here. She is present today as well and I did discuss this with her. 09/25/2021 upon evaluation today patient appears to be doing well with regard to his wound on the sacral region. His right heel is also doing well. Unfortunately he has a new area on his left heel which does appear to be pressure injury. I do not see any evidence of active infection locally or systemically which is great news no fevers, chills, nausea, vomiting, or diarrhea. Readmission: 11-22-2021 upon evaluation today  patient presents for reevaluation here in the clinic concerning issues with his wounds. Since have last seen him he was in the hospital and then subsequently ended up in a skilled nursing facility. Subsequently period of time in the facility he  actually had a breakdown in the right thigh location which has been an issue for him. Fortunately I do not see any evidence of active infection locally or systemically which is great news and I am very pleased in that regard. Nonetheless he does have still the wounds on both heels as well as the wound in the sacral area and the new area in the right upper thigh/gluteal region. 12-13-2021 upon evaluation today patient appears to be doing well currently in regard to his wounds. The left heel is going to require some sharp debridement. Fortunately the right heel seems to be doing quite well. Overall I think his gluteal region as well as sacral region also are doing well. 01-10-2022 upon evaluation today patient appears to be doing better in regard to some areas unfortunately his sacral area is not doing as well. It seems of gotten worse his caregiver states when he was in the hospital recently they really did not offloading. Fortunately I do not see any evidence of active infection locally or systemically at this time. 02-01-2022 upon evaluation today patient appears to be doing well currently in regard to his wounds. He is going require some sharp debridement of clearway some of the necrotic debris. Fortunately I do not see any evidence of active infection locally or systemically which is great news. No fevers, chills, nausea, vomiting, or diarrhea. 02-22-2022 upon evaluation today patient appears to be doing poorly in regard to his right heel the left heel is completely closed. Unfortunately I think he is got some deep tissue injury to this area I am going to do a culture at this spot. With that being said he is having 2 other significant issues in regard to the left thigh this is showing signs of cellulitis all the way down into the thigh and was seeing definite temperature difference in the left thigh versus the right thigh when feeling. It also is of note that the patient is also having some issues here  with trouble in regard to having Hantz, Justan ([id number]) fevers his wife has been giving him Tylenol and ibuprofen to help keep the fevers under control but he has been as high as 102 this is pretty much been going on since Monday or Tuesday of this week. Nonetheless I am concerned I am not exactly sure where the cellulitis on the left thigh is coming from not sure if this is emanating from his sacral region that is a possibility or if there is something completely different going on either way with the fevers I am more worried that he needs to go get this checked out as soon as possible. 03-08-2022 upon evaluation today patient appears to be doing well with regard to his heel as well as sacral region. Both are showing signs of significant improvement compared to the last time I saw him. Overall I think we are in a much better spot 04-04-2022 upon evaluation today patient appears to be doing about the same in regard to his wounds. I do not see any signs of improvement nor worsening at this point. Fortunately there is no evidence of active infection at this time. He is not staying off of this is much as he needs to in fact is not even sleeping  in the hospital bed with the alternating air mattress. Electronic Signature(s) Signed: 04/04/2022 9:13:33 AM By: [name] PA-C Entered By: [name] on 04/04/2022 09:13:33 Stefan, [name] ([id number]) -------------------------------------------------------------------------------- Physical Exam Details Patient Name: [name] Date of Service: 04/04/2022 8:30 AM Medical Record Number: [id number] Patient Account Number: [id number] Date of Birth/Sex: 1955/11/21 (65 y.o. M) Treating RN: Cornell Barman Primary Care Provider: [name] Other Clinician: Referring Provider: [name] Treating Provider/Extender: Skipper Cliche in Treatment: 19 Constitutional Obese and well-hydrated in no acute  distress. Respiratory normal breathing without difficulty. Psychiatric this patient is able to make decisions and demonstrates good insight into disease process. Alert and Oriented x 3. pleasant and cooperative. Notes Upon inspection patient's wound bed actually showed signs of really maintaining but that is about it as far as his wounds are concerned. I did perform debridement of both locations to clean away necrotic debris. With that being said I think the biggest issue is he is just really not staying off of this as he should be. Electronic Signature(s) Signed: 04/04/2022 9:13:51 AM By: [name] PA-C Entered By: [name] on 04/04/2022 09:13:51 Nitschke, [name] ([id number]) -------------------------------------------------------------------------------- Physician Orders Details Patient Name: [name] Date of Service: 04/04/2022 8:30 AM Medical Record Number: [id number] Patient Account Number: [id number] Date of Birth/Sex: 04-04-1956 (65 y.o. M) Treating RN: Cornell Barman Primary Care Provider: [name] Other Clinician: Referring Provider: [name] Treating Provider/Extender: Skipper Cliche in Treatment: 33 Verbal / Phone Orders: No Diagnosis Coding Follow-up Appointments o Return Appointment in 3 weeks. [facility] for wound care. May utilize formulary equivalent dressing for wound treatment orders unless otherwise specified. Home Health Nurse may visit PRN to address patientos wound care needs. Kathaleen Bury phone [phone] fax [phone] o Scheduled days for dressing changes to be completed; exception, patient has scheduled wound care visit that day. o **Please direct any NON-WOUND related issues/requests for orders to patient's Primary Care Physician. **If current dressing causes regression in wound condition, may D/C ordered dressing product/s and apply Normal Saline Moist Dressing daily until  next [place] or Other MD appointment. **Notify [facility] of regression in wound condition at [phone]. Bathing/ Shower/ Hygiene o May shower with wound dressing protected with water repellent cover or cast protector. o No tub bath. Anesthetic (Use 'Patient Medications' Section for Anesthetic Order Entry) o Lidocaine applied to wound bed Edema Control - Lymphedema / Segmental Compressive Device / Other o Elevate, Exercise Daily and Avoid Standing for Long Periods of Time. o Elevate legs to the level of the heart and pump ankles as often as possible o Elevate leg(s) parallel to the floor when sitting. o DO YOUR BEST to sleep in the bed at night. DO NOT sleep in your recliner. Long hours of sitting in a recliner leads to swelling of the legs and/or potential wounds on your backside. Off-Loading o Gel wheelchair cushion o Low air-loss mattress (Group 2) - Sleep in bed every night. o Turn and reposition every 2 hours - keep pressure off of the sacrum and heels wounds o Other: - PRAFO boot in bed keep pressure off of sacrum/gluteus and heels- Additional Orders / Instructions o Follow Nutritious Diet and Increase Protein Intake Wound Treatment Wound #5 - Calcaneus Wound Laterality: Right Primary Dressing: Hydrofera Blue Ready Transfer Foam, 2.5x2.5 (in/in) 3 x Per Week/30 Days Discharge Instructions: Apply Hydrofera Blue Ready to wound bed as directed Secondary Dressing: ABD Pad 5x9 (in/in)  3 x Per Week/30 Days Discharge Instructions: Cover with ABD pad Secured With: Medipore Tape - 4M Medipore H Soft Cloth Surgical Tape, 2x2 (in/yd) 3 x Per Week/30 Days Secured With: Conform 4'' - Conforming Stretch Gauze Bandage 4x75 (in/in) 3 x Per Week/30 Days Discharge Instructions: Apply as directed Wound #7 - Sacrum Topical: calmoseptine 1 x Per Day/30 Days Discharge Instructions: apply to excorated areas Primary Dressing: Hydrofera Blue Ready  Transfer Foam, 4x5 (in/in) 1 x Per Day/30 Days Discharge Instructions: spiral hydrofera blue to lightly pack into wound, then cut to cover wound Hazan, Nasser ([id number]) Secondary Dressing: ABD Pad 5x9 (in/in) 1 x Per Day/30 Days Discharge Instructions: Cover with ABD pad Secured With: Medipore Tape - 4M Medipore H Soft Cloth Surgical Tape, 2x2 (in/yd) 1 x Per Day/30 Days Electronic Signature(s) Signed: 04/04/2022 2:50:44 PM By: [name] PA-C Signed: 04/05/2022 3:11:48 PM By: [name], BSN, RN, CWS, Kim RN, BSN Entered By: [name], BSN, RN, CWS, Kim on 04/04/2022 09:05:38 Altemus, [name] ([id number]) -------------------------------------------------------------------------------- Problem List Details Patient Name: [name] Date of Service: 04/04/2022 8:30 AM Medical Record Number: [id number] Patient Account Number: [id number] Date of Birth/Sex: [birth year] (65 y.o. M) Treating RN: Cornell Barman Primary Care Provider: [name] Other Clinician: Referring Provider: [name] Treating Provider/Extender: Skipper Cliche in Treatment: 19 Active Problems ICD-10 Encounter Code Description Active Date MDM Diagnosis L89.613 Pressure ulcer of right heel, stage 3 11/22/2021 No Yes L89.623 Pressure ulcer of left heel, stage 3 11/22/2021 No Yes L89.154 Pressure ulcer of sacral region, stage 4 11/22/2021 No Yes L24.A0 Irritant contact dermatitis due to friction or contact with body fluids, 11/22/2021 No Yes unspecified L98.412 Non-pressure chronic ulcer of buttock with fat layer exposed 11/22/2021 No Yes M62.81 Muscle weakness (generalized) 11/22/2021 No Yes F31.9 Bipolar disorder, unspecified 11/22/2021 No Yes E11.622 Type 2 diabetes mellitus with other skin ulcer 11/22/2021 No Yes I10 Essential (primary) hypertension 11/22/2021 No Yes Z79.01 Long term (current) use of anticoagulants 11/22/2021 No Yes 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 11/22/2021  No Yes Inactive Problems Resolved Problems ETHER, WOLTERS ([id number]) Electronic Signature(s) Signed: 04/04/2022 9:19:58 AM By: [name] PA-C Entered By: [name] on 04/04/2022 09:19:58 Northcraft, [name] ([id number]) -------------------------------------------------------------------------------- Progress Note Details Patient Name: [name] Date of Service: 04/04/2022 8:30 AM Medical Record Number: [id number] Patient Account Number: [id number] Date of Birth/Sex: 10-Jun-1956 (65 y.o. M) Treating RN: Cornell Barman Primary Care Provider: [name] Other Clinician: Referring Provider: [name] Treating Provider/Extender: Skipper Cliche in Treatment: 19 Subjective Chief Complaint Information obtained from Patient Sacral, right gluteal, and bilateral heel ulcers History of Present Illness (HPI) 05/29/2021 this is a patient who presents today for initial inspection here in the clinic concerning wounds that he has over the right heel and the sacral region. Unfortunately the sacral wound is starting to spread off to the right gluteal location due to how he sits always leaning towards the right side in his chair. His wife is present she is the primary caregiver though she is not home with him all the time she does have to work. She does do an excellent job however it appears to be in trying to keep things under good control for him. The patient is not able to change positions himself nor walk by himself so he is pretty much at the mercy of the position he is putting when she is gone and this tends to be his chair which she sits and most of the day. Obviously this  I think is the main culprit for what is going on currently. It was actually in January 2020 when the sacral wound started. It was in September 2022 when the wound started to spread more to the right gluteal location. Subsequently in August 2022 is when he had been in a skilled nursing  facility and the heel started to give him trouble as well. That does not seem to be doing nearly as poorly as the sacral region. He was hospitalized in October 2022 secondary to sepsis and this was in regard to the foot and was sent to skilled nursing again he is now back at home. He did have a wound VAC for the sacral wound over the summer 2022 but being in and out of facilities this ended up getting sent back. The patient does have [facility] home health that comes out 1 time per week to help with care. His most recent hemoglobin A1c was 6.9 in August 2022. Patient's met past medical history includes generalized muscle weakness, bipolar disorder, diabetes mellitus type 2, hypertension, long-term use of anticoagulant therapy due to frequent blood clots/DVTs. He also has a history of traumatic brain injury. 07/24/2021 upon evaluation today patient appears to be doing decently well in regard to the pressure ulcer on the right heel as well as the sacral region. In general I think you are making some progress here which is great news. Overall the heel unfortunately had already closed previously when we saw him although it apparently reopened when he was working with physical therapy according to his wife. The area in the sacral region is doing well and looks clean there is still some depth here but I still think it would be difficult to wound VAC this region. His wife does an awesome job taking care of him. Is been so long since we have seen him because he has been in the hospital to be honest. 08/07/2021 upon evaluation today patient appears to be doing well at this time. Fortunately I do not see any signs of active infection locally or systemically at this time which is great news. No fevers, chills, nausea, vomiting, or diarrhea. Unfortunately after I saw him on the 24th he actually ended up in the hospital in the 27th due to being septic. This was not due to the wounds but after looking at records actually  due to a UTI. Fortunately he is doing much better and very happy in that regard. I do not see any signs of infection locally nor systemically at this time. 08/21/2021 upon evaluation today patient appears to be doing well with regard to his wound. Fortunately I think that the sacral region is doing decently well at this point which is great news. With regard to the foot this also does have some slough noted but I feel like we are making progress here. He does have some irritation around the right upper thigh/gluteal region. I feel like it is more towards the thigh. Nonetheless this does appear to be pressure related he spends a lot of time sitting up his caregiver states he really will not get in the bed and stay there like he should. 09/04/2021 upon evaluation patient appears to be doing decently well today in regard to the wound on his heel as well as the sacral area. I am actually very pleased with both and how things are appearing currently. There does not appear to be any evidence of active infection I think that his caregiver is doing an awesome job with  regard to the wound care here. She is present today as well and I did discuss this with her. 09/25/2021 upon evaluation today patient appears to be doing well with regard to his wound on the sacral region. His right heel is also doing well. Unfortunately he has a new area on his left heel which does appear to be pressure injury. I do not see any evidence of active infection locally or systemically which is great news no fevers, chills, nausea, vomiting, or diarrhea. Readmission: 11-22-2021 upon evaluation today patient presents for reevaluation here in the clinic concerning issues with his wounds. Since have last seen him he was in the hospital and then subsequently ended up in a skilled nursing facility. Subsequently period of time in the facility he actually had a breakdown in the right thigh location which has been an issue for him. Fortunately I  do not see any evidence of active infection locally or systemically which is great news and I am very pleased in that regard. Nonetheless he does have still the wounds on both heels as well as the wound in the sacral area and the new area in the right upper thigh/gluteal region. 12-13-2021 upon evaluation today patient appears to be doing well currently in regard to his wounds. The left heel is going to require some sharp debridement. Fortunately the right heel seems to be doing quite well. Overall I think his gluteal region as well as sacral region also are doing well. 01-10-2022 upon evaluation today patient appears to be doing better in regard to some areas unfortunately his sacral area is not doing as well. It seems of gotten worse his caregiver states when he was in the hospital recently they really did not offloading. Fortunately I do not see any evidence of active infection locally or systemically at this time. 02-01-2022 upon evaluation today patient appears to be doing well currently in regard to his wounds. He is going require some sharp debridement of clearway some of the necrotic debris. Fortunately I do not see any evidence of active infection locally or systemically which is great news. No fevers, chills, nausea, vomiting, or diarrhea. EMMITTE, SURGEON ([id number]) 02-22-2022 upon evaluation today patient appears to be doing poorly in regard to his right heel the left heel is completely closed. Unfortunately I think he is got some deep tissue injury to this area I am going to do a culture at this spot. With that being said he is having 2 other significant issues in regard to the left thigh this is showing signs of cellulitis all the way down into the thigh and was seeing definite temperature difference in the left thigh versus the right thigh when feeling. It also is of note that the patient is also having some issues here with trouble in regard to having fevers his wife has been giving  him Tylenol and ibuprofen to help keep the fevers under control but he has been as high as 102 this is pretty much been going on since Monday or Tuesday of this week. Nonetheless I am concerned I am not exactly sure where the cellulitis on the left thigh is coming from not sure if this is emanating from his sacral region that is a possibility or if there is something completely different going on either way with the fevers I am more worried that he needs to go get this checked out as soon as possible. 03-08-2022 upon evaluation today patient appears to be doing well with regard to his heel  as well as sacral region. Both are showing signs of significant improvement compared to the last time I saw him. Overall I think we are in a much better spot 04-04-2022 upon evaluation today patient appears to be doing about the same in regard to his wounds. I do not see any signs of improvement nor worsening at this point. Fortunately there is no evidence of active infection at this time. He is not staying off of this is much as he needs to in fact is not even sleeping in the hospital bed with the alternating air mattress. Objective Constitutional Obese and well-hydrated in no acute distress. Vitals Time Taken: 8:38 AM, Height: 66 in, Weight: 279 lbs, BMI: 45, Temperature: 97.8 F, Pulse: 64 bpm, Respiratory Rate: 18 breaths/min, Blood Pressure: 170/94 mmHg. Respiratory normal breathing without difficulty. Psychiatric this patient is able to make decisions and demonstrates good insight into disease process. Alert and Oriented x 3. pleasant and cooperative. General Notes: Upon inspection patient's wound bed actually showed signs of really maintaining but that is about it as far as his wounds are concerned. I did perform debridement of both locations to clean away necrotic debris. With that being said I think the biggest issue is he is just really not staying off of this as he should be. Integumentary (Hair,  Skin) Wound #5 status is Open. Original cause of wound was Pressure Injury. The date acquired was: 07/01/2021. The wound has been in treatment 19 weeks. The wound is located on the Right Calcaneus. The wound measures 4cm length x 4.2cm width x 0.3cm depth; 13.195cm^2 area and 3.958cm^3 volume. There is Fat Layer (Subcutaneous Tissue) exposed. There is a medium amount of serosanguineous drainage noted. There is medium (34-66%) red, pink granulation within the wound bed. There is a medium (34-66%) amount of necrotic tissue within the wound bed including Adherent Slough. Wound #7 status is Open. Original cause of wound was Pressure Injury. The date acquired was: 07/01/2021. The wound has been in treatment 19 weeks. The wound is located on the Sacrum. The wound measures 2.5cm length x 1cm width x 4.1cm depth; 1.963cm^2 area and 8.05cm^3 volume. There is Fat Layer (Subcutaneous Tissue) exposed. There is a medium amount of serosanguineous drainage noted. There is large (67- 100%) red granulation within the wound bed. There is no necrotic tissue within the wound bed. Procedures Wound #5 Pre-procedure diagnosis of Wound #5 is a Pressure Ulcer located on the Right Calcaneus . There was a Excisional Skin/Subcutaneous Tissue Debridement with a total area of 16.8 sq cm performed by Tommie Sams., PA-C. With the following instrument(s): Curette to remove Viable and Non-Viable tissue/material. Material removed includes Subcutaneous Tissue, Slough, and Biofilm after achieving pain control using Lidocaine. No specimens were taken. A time out was conducted at 09:03, prior to the start of the procedure. A Minimum amount of bleeding was controlled with Pressure. The procedure was tolerated well. Post Debridement Measurements: 4cm length x 4.2cm width x 0.4cm depth; 5.278cm^3 volume. Post debridement Stage noted as Category/Stage III. Character of Wound/Ulcer Post Debridement is stable. Post procedure Diagnosis Wound  #5: Same as Pre-Procedure ZAHIR, EISENHOUR ([id number]) Plan Follow-up Appointments: Return Appointment in 3 weeks. Home Health: [facility] for wound care. May utilize formulary equivalent dressing for wound treatment orders unless otherwise specified. Home Health Nurse may visit PRN to address patient s wound care needs. Kathaleen Bury phone [phone] fax [phone] Scheduled days for dressing changes to be completed; exception, patient has scheduled wound care visit  that day. **Please direct any NON-WOUND related issues/requests for orders to patient's Primary Care Physician. **If current dressing causes regression in wound condition, may D/C ordered dressing product/s and apply Normal Saline Moist Dressing daily until next [place] or Other MD appointment. **Notify [facility] of regression in wound condition at [phone]. Bathing/ Shower/ Hygiene: May shower with wound dressing protected with water repellent cover or cast protector. No tub bath. Anesthetic (Use 'Patient Medications' Section for Anesthetic Order Entry): Lidocaine applied to wound bed Edema Control - Lymphedema / Segmental Compressive Device / Other: Elevate, Exercise Daily and Avoid Standing for Long Periods of Time. Elevate legs to the level of the heart and pump ankles as often as possible Elevate leg(s) parallel to the floor when sitting. DO YOUR BEST to sleep in the bed at night. DO NOT sleep in your recliner. Long hours of sitting in a recliner leads to swelling of the legs and/or potential wounds on your backside. Off-Loading: Gel wheelchair cushion Low air-loss mattress (Group 2) - Sleep in bed every night. Turn and reposition every 2 hours - keep pressure off of the sacrum and heels wounds Other: - PRAFO boot in bed keep pressure off of sacrum/gluteus and heels- Additional Orders / Instructions: Follow Nutritious Diet and Increase Protein Intake WOUND #5: -  Calcaneus Wound Laterality: Right Primary Dressing: Hydrofera Blue Ready Transfer Foam, 2.5x2.5 (in/in) 3 x Per Week/30 Days Discharge Instructions: Apply Hydrofera Blue Ready to wound bed as directed Secondary Dressing: ABD Pad 5x9 (in/in) 3 x Per Week/30 Days Discharge Instructions: Cover with ABD pad Secured With: Medipore Tape - 42M Medipore H Soft Cloth Surgical Tape, 2x2 (in/yd) 3 x Per Week/30 Days Secured With: Conform 4'' - Conforming Stretch Gauze Bandage 4x75 (in/in) 3 x Per Week/30 Days Discharge Instructions: Apply as directed WOUND #7: - Sacrum Wound Laterality: Topical: calmoseptine 1 x Per Day/30 Days Discharge Instructions: apply to excorated areas Primary Dressing: Hydrofera Blue Ready Transfer Foam, 4x5 (in/in) 1 x Per Day/30 Days Discharge Instructions: spiral hydrofera blue to lightly pack into wound, then cut to cover wound Secondary Dressing: ABD Pad 5x9 (in/in) 1 x Per Day/30 Days Discharge Instructions: Cover with ABD pad Secured With: Medipore Tape - 42M Medipore H Soft Cloth Surgical Tape, 2x2 (in/yd) 1 x Per Day/30 Days 1. I am going to suggest that we have the patient go ahead and continue with the recommendation for wound care measures as before with regard to the [facility] I think that this is doing quite well. 2. The patient does need to try to keep pressure off however he should be more appropriately offloading he has not been doing that up to this point and again I talked to them about it every time but he continues to do what he wants and not even sleep in his bed. We will see patient back for reevaluation in 3 weeks here in the clinic. If anything worsens or changes patient will contact our office for additional recommendations. Electronic Signature(s) Signed: 04/04/2022 9:14:51 AM By: [name] PA-C Entered By: [name] on 04/04/2022 09:14:51 Fogleman, [name]  ([id number]) -------------------------------------------------------------------------------- SuperBill Details Patient Name: [name] Date of Service: 04/04/2022 Medical Record Number: [id number] Patient Account Number: [id number] Date of Birth/Sex: Oct 10, 1955 (65 y.o. M) Treating RN: Cornell Barman Primary Care Provider: [name] Other Clinician: Referring Provider: [name] Treating Provider/Extender: Skipper Cliche in Treatment: 19 Diagnosis Coding ICD-10 Codes Code Description [phone] Pressure ulcer of right heel, stage 3  H79.641 Pressure ulcer of left heel, stage 3 L89.154 Pressure ulcer of sacral region, stage 4 L24.A0 Irritant contact dermatitis due to friction or contact with body fluids, unspecified L98.412 Non-pressure chronic ulcer of buttock with fat layer exposed M62.81 Muscle weakness (generalized) F31.9 Bipolar disorder, unspecified E11.622 Type 2 diabetes mellitus with other skin ulcer I10 Essential (primary) hypertension Z79.01 Long term (current) use of anticoagulants 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 Facility Procedures CPT4 Code: [id number] Description: 11042 - DEB SUBQ TISSUE 20 SQ CM/< Modifier: Quantity: 1 CPT4 Code: Description: ICD-10 Diagnosis Description L89.613 Pressure ulcer of right heel, 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

## 2022-04-16 ENCOUNTER — Encounter: Payer: Medicare Other | Admitting: Physician Assistant

## 2022-04-16 DIAGNOSIS — E11622 Type 2 diabetes mellitus with other skin ulcer: Secondary | ICD-10-CM | POA: Diagnosis not present

## 2022-04-16 NOTE — Progress Notes (Addendum)
[name] ([id number]) [id number] Page 1 of 9 Visit Report for 04/16/2022 Arrival Information Details Patient Name: Date of Service: [name] [facility] 04/16/2022 3:15 PM Medical Record Number: [id number] Patient Account Number: [id number] Date of Birth/Sex: Treating RN: 12-Sep-1955 (66 y.o. [name], [name] Primary Care Duchess Armendarez: [name] Other Clinician: Massie Kluver Referring Kaulana Brindle: Treating Danarius Mcconathy/Extender: [name]: 69 Visit Information History Since Last Visit All ordered tests and consults were completed: No Patient Arrived: Wheel Chair Added or deleted any medications: No Arrival Time: 15:22 Any new allergies or adverse reactions: No Transfer Assistance: EasyPivot Patient Lift Had a fall or experienced change in No Patient Requires Transmission-Based Precautions: No activities of daily living that may affect Patient Has Alerts: Yes risk of falls: Patient Alerts: Patient on Blood Thinner Hospitalized since last visit: No Pain Present Now: No Electronic Signature(s) Signed: 04/18/2022 11:23:10 AM By: Massie Kluver Entered By: Massie Kluver on 04/16/2022 15:23:08 -------------------------------------------------------------------------------- Clinic Level of Care Assessment Details Patient Name: Date of Service: [name] [facility] 04/16/2022 3:15 PM Medical Record Number: [id number] Patient Account Number: [id number] Date of Birth/Sex: Treating RN: 30-Oct-1955 (66 y.o. [name] Primary Care [name]: [name] Other Clinician: Massie Kluver Referring [name]: Treating [name]/Extender: [name]: 20 Clinic Level of Care Assessment Items TOOL 1 Quantity Score []  - 0 Use when EandM and Procedure is performed on INITIAL visit ASSESSMENTS - Nursing Assessment / Reassessment []  - 0 General Physical Exam (combine w/  comprehensive assessment (listed just below) when performed on new pt. evals) []  - 0 Comprehensive Assessment (HX, ROS, Risk Assessments, Wounds Hx, etc.) ASSESSMENTS - Wound and Skin Assessment / Reassessment []  - 0 Dermatologic / Skin Assessment (not related to wound area) ASSESSMENTS - Ostomy and/or Continence Assessment and Care [name] ([id number]) [id number] Page 2 of 9 []  - 0 Incontinence Assessment and Management []  - 0 Ostomy Care Assessment and Management (repouching, etc.) PROCESS - Coordination of Care []  - 0 Simple Patient / Family Education for ongoing care []  - 0 Complex (extensive) Patient / Family Education for ongoing care []  - 0 Staff obtains Programmer, systems, Records, T Results / Process Orders est []  - 0 Staff telephones HHA, Nursing Homes / Clarify orders / etc []  - 0 Routine Transfer to another Facility (non-emergent condition) []  - 0 Routine Hospital Admission (non-emergent condition) []  - 0 New Admissions / Biomedical engineer / Ordering NPWT Apligraf, etc. , []  - 0 Emergency Hospital Admission (emergent condition) PROCESS - Special Needs []  - 0 Pediatric / Minor Patient Management []  - 0 Isolation Patient Management []  - 0 Hearing / Language / Visual special needs []  - 0 Assessment of Community assistance (transportation, D/C planning, etc.) []  - 0 Additional assistance / Altered mentation []  - 0 Support Surface(s) Assessment (bed, cushion, seat, etc.) INTERVENTIONS - Miscellaneous []  - 0 External ear exam []  - 0 Patient Transfer (multiple staff / Civil Service fast streamer / Similar devices) []  - 0 Simple Staple / Suture removal (25 or less) []  - 0 Complex Staple / Suture removal (26 or more) []  - 0 Hypo/Hyperglycemic Management (do not check if billed separately) []  - 0 Ankle / Brachial Index (ABI) - do not check if billed separately Has the patient been seen at the hospital within the last three years:  Yes Total Score: 0 Level Of Care: ____ Electronic Signature(s) Signed: 04/18/2022 11:23:10 AM By: Massie Kluver Entered By: Massie Kluver on 04/16/2022 15:51:02 -------------------------------------------------------------------------------- Complex / Palliative  Patient Assessment Details Patient Name: Date of Service: [name] [facility] 04/16/2022 3:15 PM Medical Record Number: [id number] Patient Account Number: [id number] Date of Birth/Sex: Treating RN: Nov 22, 1955 (66 y.o. [name] Primary Care [name]: [name] Other Clinician: Massie Kluver Referring [name]: Treating [name]/Extender: [name]: 20 Complex Wound Management Criteria Patient has remarkable or complex co-morbidities requiring medications or treatments that extend wound healing times. ExamplesSHAMARCUS, [name] ([id number]) [id number] Page 3 of 9 Diabetes mellitus with chronic renal failure or end stage renal disease requiring dialysis Advanced or poorly controlled rheumatoid arthritis Diabetes mellitus and end stage chronic obstructive pulmonary disease Active cancer with current chemo- or radiation therapy Brain injury, wheelchair dependent Palliative Wound Management Criteria Care Approach Wound Care Plan: Complex Wound Management Electronic Signature(s) Signed: 05/06/2022 12:39:35 PM By: [name], BSN, RN, CWS, Kim RN, BSN Signed: 05/30/2022 5:47:20 PM By: [name] Keeler PA-C Entered By: [name], BSN, RN, CWS, Kim on 05/06/2022 12:39:35 -------------------------------------------------------------------------------- Encounter Discharge Information Details Patient Name: Date of Service: [name] [facility] 04/16/2022 3:15 PM Medical Record Number: [id number] Patient Account Number: [id number] Date of Birth/Sex: Treating RN: 1955/11/11 (66 y.o. [name] Primary Care [name]: [name] Other Clinician:  Massie Kluver Referring [name]: Treating [name]/Extender: [name]: 20 Encounter Discharge Information Items Post Procedure Vitals Discharge Condition: Stable Temperature (F): 97.9 Ambulatory Status: Wheelchair Pulse (bpm): 80 Discharge Destination: Home Respiratory Rate (breaths/min): 18 Transportation: Private Auto Blood Pressure (mmHg): 164/94 Accompanied By: wife Schedule Follow-up Appointment: Yes Clinical Summary of Care: Electronic Signature(s) Signed: 04/18/2022 11:23:10 AM By: Massie Kluver Entered By: Massie Kluver on 04/16/2022 16:06:42 -------------------------------------------------------------------------------- Lower Extremity Assessment Details Patient Name: Date of Service: [name] [facility] 04/16/2022 3:15 PM Medical Record Number: [id number] Patient Account Number: [id number] Date of Birth/Sex: Treating RN: [birth year] (66 y.o. [name] Primary Care Ichelle Harral: [name] Other Clinician: Massie Kluver Referring Princess Karnes: Treating Benedict Kue/Extender: [name]: [address], [place] ([id number]) [id number] Page 4 of 9 Electronic Signature(s) Signed: 04/16/2022 5:24:59 PM By: [name], BSN, RN, CWS, Kim RN, BSN Signed: 04/18/2022 11:23:10 AM By: Massie Kluver Entered By: Massie Kluver on 04/16/2022 15:41:49 -------------------------------------------------------------------------------- Multi Wound Chart Details Patient Name: Date of Service: [name] [facility] 04/16/2022 3:15 PM Medical Record Number: [id number] Patient Account Number: [id number] Date of Birth/Sex: Treating RN: 1955-08-20 (66 y.o. [name], Kim Primary Care Linden Mikes: [name] Other Clinician: Massie Kluver Referring Kela Baccari: Treating Angelly Spearing/Extender: [name]: 20 Vital Signs Height(in):  66 Pulse(bpm): 80 Weight(lbs): 279 Blood Pressure(mmHg): 164/94 Body Mass Index(BMI): 45 Temperature(F): 97.9 Respiratory Rate(breaths/min): 18 [5:Photos: No Photos Right Calcaneus Wound Location: Pressure Injury Wounding Event: Pressure Ulcer Primary Etiology: Anemia, Sleep Apnea, Coronary Artery Anemia, Sleep Apnea, Coronary Artery N/A Comorbid History: Disease, Deep Vein Thrombosis, Hypertension, Type  II Diabetes, History of pressure wounds, Neuropathy 07/01/2021 Date Acquired: 20 Weeks of Treatment: Open Wound Status: No Wound Recurrence: 3.6x4.1x0.3 Measurements L x W x D (cm) 11.592 A (cm) : rea 3.478 Volume (cm) : -180.10% % Reduction in A rea:  -740.10% % Reduction in Volume: Category/Stage III Classification: Medium Exudate A mount: Serosanguineous Exudate Type: red, brown Exudate Color: Medium (34-66%) Granulation A mount: Red, Pink Granulation Quality: Medium (34-66%) Necrotic A mount: Fat  Layer (Subcutaneous Tissue): Yes Fat Layer (Subcutaneous Tissue): Yes N/A Exposed Structures: Fascia: No Tendon: No Muscle: No Joint: No Bone: No None Epithelialization:] [7:No Photos Sacrum Pressure Injury  Pressure Ulcer Disease, Deep Vein Thrombosis,  Hypertension, Type II Diabetes, History of pressure wounds, Neuropathy 07/01/2021 20 Open No 2.9x2x3.5 4.555 15.944 -45.00% -69.20% Category/Stage IV Medium Serosanguineous red, brown Large (67-100%) Red None Present (0%) Fascia: No Tendon: No Muscle: No  Joint: No Bone: No None] [N/A:N/A N/A N/A N/A N/A N/A N/A N/A N/A N/A N/A N/A N/A N/A N/A N/A N/A N/A N/A N/A N/A] Treatment Notes Electronic Signature(s) Signed: 04/18/2022 11:23:10 AM By: Massie Kluver Entered By: Massie Kluver on 04/16/2022 15:41:58 Crumpacker, [name] ([id number]) [id number] Page 5 of 9 -------------------------------------------------------------------------------- Multi-Disciplinary Care Plan Details Patient Name: Date of Service: [name] [facility] 04/16/2022 3:15 PM Medical Record Number: [id number] Patient Account Number: [id number] Date of Birth/Sex: Treating RN: 1955-07-30 (66 y.o. [name], [name] Primary Care Soraiya Ahner: [name] Other Clinician: Massie Kluver Referring Maryam Feely: Treating Mays Paino/Extender: [name]: 20 Active Inactive Wound/Skin Impairment Nursing Diagnoses: Knowledge deficit related to ulceration/compromised skin integrity Goals: Patient/caregiver will verbalize understanding of skin care regimen Date Initiated: 11/22/2021 Target Resolution Date: 02/22/2022 Goal Status: Active Ulcer/skin breakdown will have a volume reduction of 30% by week 4 Date Initiated: 11/22/2021 Date Inactivated: 01/10/2022 Target Resolution Date: 12/23/2021 Goal Status: Unmet Unmet Reason: comorbities Ulcer/skin breakdown will have a volume reduction of 50% by week 8 Date Initiated: 11/22/2021 Target Resolution Date: 01/22/2022 Goal Status: Active Ulcer/skin breakdown will have a volume reduction of 80% by week 12 Date Initiated: 11/22/2021 Target Resolution Date: 02/22/2022 Goal Status: Active Ulcer/skin breakdown will heal within 14 weeks Date Initiated: 11/22/2021 Target Resolution Date: 03/25/2022 Goal Status: Active Interventions: Assess patient/caregiver ability to obtain necessary supplies Assess patient/caregiver ability to perform ulcer/skin care regimen upon admission and as needed Assess ulceration(s) every visit Notes: Electronic Signature(s) Signed: 04/16/2022 5:24:59 PM By: [name], BSN, RN, CWS, Kim RN, BSN Signed: 04/18/2022 11:23:10 AM By: Massie Kluver Entered By: Massie Kluver on 04/16/2022 15:41:52 Pain Assessment Details -------------------------------------------------------------------------------- [name] ([id number]) [id number] Page 6 of 9 Patient Name: Date of Service: [name] [facility]  04/16/2022 3:15 PM Medical Record Number: [id number] Patient Account Number: [id number] Date of Birth/Sex: Treating RN: 06/01/1956 (66 y.o. [name], [name] Primary Care Addyson Traub: [name] Other Clinician: Massie Kluver Referring Kal Chait: Treating Colena Ketterman/Extender: [name]: 20 Active Problems Location of Pain Severity and Description of Pain Patient Has Paino No Site Locations Pain Management and Medication Current Pain Management: Electronic Signature(s) Signed: 04/16/2022 5:24:59 PM By: [name], BSN, RN, CWS, Kim RN, BSN Signed: 04/18/2022 11:23:10 AM By: Massie Kluver Entered By: Massie Kluver on 04/16/2022 15:25:52 -------------------------------------------------------------------------------- Patient/Caregiver Education Details Patient Name: Date of Service: [name] [facility] 10/17/2023andnbsp3:15 PM Medical Record Number: [id number] Patient Account Number: [id number] Date of Birth/Gender: Treating RN: [birth year] (66 y.o. [name] Primary Care Physician: [name] Other Clinician: Massie Kluver Referring Physician: Treating Physician/Extender: [name]: 20 Education Assessment Education Provided To: Patient and Caregiver Education Topics Provided Wound/Skin Impairment: Methods: Explain/Verbal Responses: State content correctly LARAMIE, ASTURIAS ([id number]) [id number] Page 7 of 9 Electronic Signature(s) Signed: 04/18/2022 11:23:10 AM By: Massie Kluver Entered By: Massie Kluver on 04/16/2022 15:51:24 -------------------------------------------------------------------------------- Wound Assessment Details Patient Name: Date of Service: [name] [facility] 04/16/2022 3:15 PM Medical Record Number: [id number] Patient Account Number: [id number] Date of Birth/Sex: Treating RN: 07/03/1955 (66 y.o. [name] Primary Care  Valena Ivanov: [name] Other Clinician: Massie Kluver Referring Albin Duckett: Treating Nik Gorrell/Extender: [name]: 63  Wound Status Wound Number: 5 Primary Pressure Ulcer Etiology: Wound Location: Right Calcaneus Wound Open Wounding Event: Pressure Injury Status: Date Acquired: 07/01/2021 Comorbid Anemia, Sleep Apnea, Coronary Artery Disease, Deep Vein Weeks Of Treatment: 20 History: Thrombosis, Hypertension, Type II Diabetes, History of pressure Clustered Wound: No wounds, Neuropathy Photos Wound Measurements Length: (cm) 3.6 Width: (cm) 4.1 Depth: (cm) 0.3 Area: (cm) 11.592 Volume: (cm) 3.478 % Reduction in Area: -180.1% % Reduction in Volume: -740.1% Epithelialization: None Wound Description Classification: Category/Stage III Exudate Amount: Medium Exudate Type: Serosanguineous Exudate Color: red, brown Foul Odor After Cleansing: No Slough/Fibrino Yes Wound Bed Granulation Amount: Medium (34-66%) Exposed Structure Granulation Quality: Red, Pink Fascia Exposed: No Necrotic Amount: Medium (34-66%) Fat Layer (Subcutaneous Tissue) Exposed: Yes Necrotic Quality: Adherent Slough Tendon Exposed: No Muscle Exposed: No Joint Exposed: No Bone Exposed: No Electronic Signature(s) Signed: 04/16/2022 5:24:59 PM By: [name], BSN, RN, CWS, Kim RN, BSN [place], [place] ([id number]) [phone].pdf Page 8 of 9 Signed: 04/18/2022 11:23:10 AM By: Massie Kluver Entered By: Massie Kluver on 04/16/2022 15:43:20 -------------------------------------------------------------------------------- Wound Assessment Details Patient Name: Date of Service: [name] [facility] 04/16/2022 3:15 PM Medical Record Number: [id number] Patient Account Number: [id number] Date of Birth/Sex: Treating RN: [birth year] (66 y.o. [name] Primary Care Makinsley Schiavi: [name] Other Clinician: Massie Kluver Referring  Desi Carby: Treating Yuliza Cara/Extender: [name]: 20 Wound Status Wound Number: 7 Primary Pressure Ulcer Etiology: Wound Location: Sacrum Wound Open Wounding Event: Pressure Injury Status: Date Acquired: 07/01/2021 Comorbid Anemia, Sleep Apnea, Coronary Artery Disease, Deep Vein Weeks Of Treatment: 20 History: Thrombosis, Hypertension, Type II Diabetes, History of pressure Clustered Wound: No wounds, Neuropathy Photos Wound Measurements Length: (cm) 2.9 Width: (cm) 2 Depth: (cm) 3.5 Area: (cm) 4.555 Volume: (cm) 15.944 % Reduction in Area: -45% % Reduction in Volume: -69.2% Epithelialization: None Wound Description Classification: Category/Stage IV Exudate Amount: Medium Exudate Type: Serosanguineous Exudate Color: red, brown Foul Odor After Cleansing: No Slough/Fibrino Yes Wound Bed Granulation Amount: Large (67-100%) Exposed Structure Granulation Quality: Red Fascia Exposed: No Necrotic Amount: None Present (0%) Fat Layer (Subcutaneous Tissue) Exposed: Yes Tendon Exposed: No Muscle Exposed: No Joint Exposed: No Bone Exposed: No Electronic Signature(s) Signed: 04/16/2022 5:24:59 PM By: [name], BSN, RN, CWS, Kim RN, BSN Signed: 04/18/2022 11:23:10 AM By: Lynnda Child, [name] ([id number]) Clifton James, Angie [id number] Page 9 of 9 Signed: 04/18/2022 11:23:10 AM By: Jenny Reichmann By: Massie Kluver on 04/16/2022 15:43:42 -------------------------------------------------------------------------------- Vitals Details Patient Name: Date of Service: [name] [facility] 04/16/2022 3:15 PM Medical Record Number: [id number] Patient Account Number: [id number] Date of Birth/Sex: Treating RN: 05-Aug-1955 (66 y.o. [name], [name] Primary Care Meher Kucinski: [name] Other Clinician: Massie Kluver Referring Jayant Kriz: Treating Odie Edmonds/Extender: [name]:  20 Vital Signs Time Taken: 15:23 Temperature (F): 97.9 Height (in): 66 Pulse (bpm): 80 Weight (lbs): 279 Respiratory Rate (breaths/min): 18 Body Mass Index (BMI): 45 Blood Pressure (mmHg): 164/94 Reference Range: 80 - 120 mg / dl Electronic Signature(s) Signed: 04/18/2022 11:23:10 AM By: Massie Kluver Entered By: Massie Kluver on 04/16/2022 15:25:39

## 2022-04-16 NOTE — Progress Notes (Signed)
[name] ([id number]) [id number] Page 1 of 10 Visit Report for 04/16/2022 Chief Complaint Document Details Patient Name: Date of Service: [name] [facility] 04/16/2022 3:15 PM Medical Record Number: [id number] Patient Account Number: [id number] Date of Birth/Sex: Treating RN: 1956-02-28 (66 y.o. [name], [name] Primary Care Provider: [name] Other Clinician: Massie Kluver Referring Provider: Treating Provider/Extender: [name]: 20 Information Obtained from: Patient Chief Complaint Sacral, right gluteal, and bilateral heel ulcers Electronic Signature(s) Signed: 04/16/2022 3:18:27 PM By: [name] PA-C Entered By: [name] on 04/16/2022 15:18:27 -------------------------------------------------------------------------------- Debridement Details Patient Name: Date of Service: [name] [facility] 04/16/2022 3:15 PM Medical Record Number: [id number] Patient Account Number: [id number] Date of Birth/Sex: Treating RN: 1956-01-16 (66 y.o. [name], [name] Primary Care Provider: [name] Other Clinician: Massie Kluver Referring Provider: Treating Provider/Extender: [name]: 20 Debridement Performed for Assessment: Wound #5 Right Calcaneus Performed By: Physician Tommie Sams., PA-C Debridement Type: Debridement Level of Consciousness (Pre-procedure): Awake and Alert Pre-procedure Verification/Time Out Yes - 15:47 Taken: Start Time: 15:47 T Area Debrided (L x W): otal 3.6 (cm) x 4.1 (cm) = 14.76 (cm) Tissue and other material debrided: Viable, Non-Viable, Slough, Subcutaneous, Slough Level: Skin/Subcutaneous Tissue Debridement Description: Excisional Instrument: Curette Bleeding: Minimum Hemostasis Achieved: Pressure End Time: 15:49 Response to Treatment: Procedure was tolerated well Level of Consciousness (Post- Awake  and Alert procedure): [name] ([id number]) [id number] Page 2 of 10 Post Debridement Measurements of Total Wound Length: (cm) 3.6 Stage: Category/Stage III Width: (cm) 4.1 Depth: (cm) 0.3 Volume: (cm) 3.478 Character of Wound/Ulcer Post Debridement: Stable Post Procedure Diagnosis Same as Pre-procedure Electronic Signature(s) Signed: 04/16/2022 3:55:23 PM By: [name] PA-C Signed: 04/16/2022 5:24:59 PM By: [name], BSN, RN, CWS, Kim RN, BSN Signed: 04/18/2022 11:23:10 AM By: Massie Kluver Entered By: Massie Kluver on 04/16/2022 15:49:52 -------------------------------------------------------------------------------- HPI Details Patient Name: Date of Service: [name] [facility] 04/16/2022 3:15 PM Medical Record Number: [id number] Patient Account Number: [id number] Date of Birth/Sex: Treating RN: 11-Jan-1956 (66 y.o. [name] Primary Care Provider: [name] Other Clinician: Massie Kluver Referring Provider: Treating Provider/Extender: [name]: 20 History of Present Illness HPI Description: 05/29/2021 this is a patient who presents today for initial inspection here in the clinic concerning wounds that he has over the right heel and the sacral region. Unfortunately the sacral wound is starting to spread off to the right gluteal location due to how he sits always leaning towards the right side in his chair. His wife is present she is the primary caregiver though she is not home with him all the time she does have to work. She does do an excellent job however it appears to be in trying to keep things under good control for him. The patient is not able to change positions himself nor walk by himself so he is pretty much at the mercy of the position he is putting when she is gone and this tends to be his chair which she sits and most of the day. Obviously this I think is the main  culprit for what is going on currently. It was actually in January 2020 when the sacral wound started. It was in September 2022 when the wound started to spread more to the right gluteal location. Subsequently in August 2022 is when he had been in a skilled nursing facility and the heel started to give him trouble as well.  That does not seem to be doing nearly as poorly as the sacral region. He was hospitalized in October 2022 secondary to sepsis and this was in regard to the foot and was sent to skilled nursing again he is now back at home. He did have a wound VAC for the sacral wound over the summer 2022 but being in and out of facilities this ended up getting sent back. The patient does have [facility] home health that comes out 1 time per week to help with care. His most recent hemoglobin A1c was 6.9 in August 2022. 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 s. 07/24/2021 upon evaluation today patient appears to be doing decently well in regard to the pressure ulcer on the right heel as well as the sacral region. In general I think you are making some progress here which is great news. Overall the heel unfortunately had already closed previously when we saw him although it apparently reopened when he was working with physical therapy according to his wife. The area in the sacral region is doing well and looks clean there is still some depth here but I still think it would be difficult to wound VAC this region. His wife does an awesome job taking care of him. Is been so long since we have seen him because he has been in the hospital to be honest. 08/07/2021 upon evaluation today patient appears to be doing well at this time. Fortunately I do not see any signs of active infection locally or  systemically at this time which is great news. No fevers, chills, nausea, vomiting, or diarrhea. Unfortunately after I saw him on the 24th he actually ended up in the hospital in the 27th due to being septic. This was not due to the wounds but after looking at records actually due to a UTI. Fortunately he is doing much better and very happy in that regard. I do not see any signs of infection locally nor systemically at this time. 08/21/2021 upon evaluation today patient appears to be doing well with regard to his wound. Fortunately I think that the sacral region is doing decently well at this point which is great news. With regard to the foot this also does have some slough noted but I feel like we are making progress here. He does have some irritation around the right upper thigh/gluteal region. I feel like it is more towards the thigh. Nonetheless this does appear to be pressure related he spends a lot of time sitting up his caregiver states he really will not get in the bed and stay there like he should. 09/04/2021 upon evaluation patient appears to be doing decently well today in regard to the wound on his heel as well as the sacral area. I am actually very pleased with both and how things are appearing currently. There does not appear to be any evidence of active infection I think that his caregiver is doing an awesome job with regard to the wound care here. She is present today as well and I did discuss this with her. 09/25/2021 upon evaluation today patient appears to be doing well with regard to his wound on the sacral region. His right heel is also doing well. Unfortunately he has a new area on his left heel which does appear to be pressure injury. I do  not see any evidence of active infection locally or systemically which is great SABAN, HEINLEN ([id number]) [id number] Page 3 of 10 news no fevers, chills, nausea, vomiting, or diarrhea. Readmission: 11-22-2021  upon evaluation today patient presents for reevaluation here in the clinic concerning issues with his wounds. Since have last seen him he was in the hospital and then subsequently ended up in a skilled nursing facility. Subsequently period of time in the facility he actually had a breakdown in the right thigh location which has been an issue for him. Fortunately I do not see any evidence of active infection locally or systemically which is great news and I am very pleased in that regard. Nonetheless he does have still the wounds on both heels as well as the wound in the sacral area and the new area in the right upper thigh/gluteal region. 12-13-2021 upon evaluation today patient appears to be doing well currently in regard to his wounds. The left heel is going to require some sharp debridement. Fortunately the right heel seems to be doing quite well. Overall I think his gluteal region as well as sacral region also are doing well. 01-10-2022 upon evaluation today patient appears to be doing better in regard to some areas unfortunately his sacral area is not doing as well. It seems of gotten worse his caregiver states when he was in the hospital recently they really did not offloading. Fortunately I do not see any evidence of active infection locally or systemically at this time. 02-01-2022 upon evaluation today patient appears to be doing well currently in regard to his wounds. He is going require some sharp debridement of clearway some of the necrotic debris. Fortunately I do not see any evidence of active infection locally or systemically which is great news. No fevers, chills, nausea, vomiting, or diarrhea. 02-22-2022 upon evaluation today patient appears to be doing poorly in regard to his right heel the left heel is completely closed. Unfortunately I think he is got some deep tissue injury to this area I am going to do a culture at this spot. With that being said he is having 2 other significant issues  in regard to the left thigh this is showing signs of cellulitis all the way down into the thigh and was seeing definite temperature difference in the left thigh versus the right thigh when feeling. It also is of note that the patient is also having some issues here with trouble in regard to having fevers his wife has been giving him Tylenol and ibuprofen to help keep the fevers under control but he has been as high as 102 this is pretty much been going on since Monday or Tuesday of this week. Nonetheless I am concerned I am not exactly sure where the cellulitis on the left thigh is coming from not sure if this is emanating from his sacral region that is a possibility or if there is something completely different going on either way with the fevers I am more worried that he needs to go get this checked out as soon as possible. 03-08-2022 upon evaluation today patient appears to be doing well with regard to his heel as well as sacral region. Both are showing signs of significant improvement compared to the last time I saw him. Overall I think we are in a much better spot 04-04-2022 upon evaluation today patient appears to be doing about the same in regard to his wounds. I do not see any signs of improvement nor worsening at this  point. Fortunately there is no evidence of active infection at this time. He is not staying off of this is much as he needs to in fact is not even sleeping in the hospital bed with the alternating air mattress. 04-16-2022 upon evaluation today patient appears to be doing well currently in regard to his wound. He has been tolerating the dressing changes without complication. Fortunately I do not see any evidence of infection. The heel actually looks quite well the sacral area actually is still quite deep. From my questioning and discussion with him today I do not believe that he is really staying off of this as much as he should. He seemed a little uncomfortable during that portion  of the conversation today. I think that is contributing quite a bit to his lack of progress with regard to healing. Electronic Signature(s) Signed: 04/16/2022 3:53:07 PM By: [name] PA-C Entered By: [name] on 04/16/2022 15:53:07 -------------------------------------------------------------------------------- Physical Exam Details Patient Name: Date of Service: [name] [facility] 04/16/2022 3:15 PM Medical Record Number: [id number] Patient Account Number: [id number] Date of Birth/Sex: Treating RN: Aug 22, 1955 (66 y.o. [name] Primary Care Provider: [name] Other Clinician: Massie Kluver Referring Provider: Treating Provider/Extender: [name]: 2 Constitutional Well-nourished and well-hydrated in no acute distress. Respiratory normal breathing without difficulty. Psychiatric this patient is able to make decisions and demonstrates good insight into disease process. Alert and Oriented x 3. pleasant and cooperative. Notes Upon inspection patient's wound bed actually showed signs of good granulation and epithelization at this point. Fortunately I do not see any evidence of active infection locally or systemically which is great news and overall I am extremely pleased with where we stand. Especially in regard to the heel. Nonetheless in regard to the sacral region I really feel like that he may still be up on this way too much and I think that we need to try and see what can be done about him ANIBAL, QUINBY ([id number]) [id number] Page 4 of 10 staying off of it more I had a conversation again with him today about making sure to avoid sitting long periods of time. He voiced understanding but again I am not sure how much she is really committing to this to be honest. Electronic Signature(s) Signed: 04/16/2022 3:54:04 PM By: [name] PA-C Entered By: [name] on  04/16/2022 15:54:03 -------------------------------------------------------------------------------- Physician Orders Details Patient Name: Date of Service: [name] [facility] 04/16/2022 3:15 PM Medical Record Number: [id number] Patient Account Number: [id number] Date of Birth/Sex: Treating RN: 11-09-1955 (66 y.o. 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 [place] for wound care. May utilize formulary equivalent dressing for wound treatment orders unless otherwise specified. Home Health Nurse may visit PRN to address patients wound care  needs. Kathaleen Bury phone [phone] fax [phone] Scheduled days for dressing changes to be completed; exception, patient has scheduled wound care visit that day. **Please direct any NON-WOUND related issues/requests for orders to patient's Primary Care Physician. **If current dressing causes regression in wound condition, may D/C ordered dressing product/s and apply Normal Saline Moist Dressing daily until next [place] or Other MD appointment. **Notify Wound Healing Center of  regression in wound condition at [phone]. Bathing/ Shower/ Hygiene May shower with wound dressing protected with water repellent cover or cast protector. No tub bath. Anesthetic (Use 'Patient Medications' Section for Anesthetic Order Entry) Lidocaine applied to wound bed Edema Control - Lymphedema / Segmental Compressive Device / Other Elevate, Exercise Daily and A void Standing for Long Periods of Time. Elevate legs to the level of the heart and pump ankles as often as possible Elevate leg(s) parallel to the floor when sitting. DO YOUR BEST to sleep in the bed at night. DO NOT sleep in your recliner. Long hours of sitting in a recliner leads to swelling of the legs and/or potential wounds on your backside. Off-Loading Gel wheelchair cushion Low air-loss mattress (Group 2) - Sleep in bed every night. LABAN, OROURKE ([id number]) [id number] Page 5 of 10 Turn and reposition every 2 hours - keep pressure off of the sacrum and heels wounds Other: - PRAFO boot in bed keep pressure off of sacrum/gluteus and heels- Additional Orders / Instructions Follow Nutritious Diet and Increase Protein Intake Wound Treatment Wound #5 - Calcaneus Wound Laterality: Right Prim Dressing: Hydrofera Blue Ready Transfer Foam, 2.5x2.5 (in/in) 3 x Per Week/30 Days ary Discharge Instructions: Apply Hydrofera Blue Ready to wound bed as directed Secondary Dressing: ABD Pad 5x9 (in/in) 3 x Per Week/30 Days Discharge Instructions: Cover with ABD pad Secured With: Medipore T - 72M Medipore H Soft Cloth Surgical T ape ape, 2x2 (in/yd) 3 x Per Week/30 Days Secured With: Conform 4'' - Conforming Stretch Gauze Bandage 4x75 (in/in) 3 x Per Week/30 Days Discharge Instructions: Apply as directed Wound #7 - Sacrum Topical: calmoseptine 1 x Per Day/30 Days Discharge Instructions: apply to excorated areas Prim Dressing: Hydrofera Blue Ready Transfer Foam, 4x5 (in/in) 1 x Per Day/30  Days ary Discharge Instructions: spiral hydrofera blue to lightly pack into wound, then cut to cover wound Secondary Dressing: ABD Pad 5x9 (in/in) 1 x Per Day/30 Days Discharge Instructions: Cover with ABD pad Secured With: Medipore T - 72M Medipore H Soft Cloth Surgical T ape ape, 2x2 (in/yd) 1 x Per Day/30 Days Electronic Signature(s) Signed: 04/16/2022 3:55:23 PM By: [name] PA-C Signed: 04/18/2022 11:23:10 AM By: Massie Kluver Entered By: Massie Kluver on 04/16/2022 15:50:56 -------------------------------------------------------------------------------- Problem List Details Patient Name: Date of Service: [name] [facility] 04/16/2022 3:15 PM Medical Record Number: [id number] Patient Account Number: [id number] Date of Birth/Sex: Treating RN: 1955-11-15 (66 y.o. [name] Primary Care Provider: [name] Other Clinician: Massie Kluver Referring Provider: Treating Provider/Extender: [name]: 20 Active Problems ICD-10 Encounter Code Description Active Date MDM Diagnosis L89.613 Pressure ulcer of right heel, stage 3 11/22/2021 No Yes L89.623 Pressure ulcer of left heel, stage 3 11/22/2021 No Yes L89.154 Pressure ulcer of sacral region, stage 4 11/22/2021 No Yes Boxer, [name] ([id number]) [id number] Page 6 of 10 L24.A0 Irritant contact dermatitis due to friction or contact with body fluids, 11/22/2021 No Yes unspecified L98.412 Non-pressure chronic ulcer of buttock with fat layer exposed 11/22/2021 No Yes M62.81 Muscle weakness (generalized) 11/22/2021  No Yes F31.9 Bipolar disorder, unspecified 11/22/2021 No Yes E11.622 Type 2 diabetes mellitus with other skin ulcer 11/22/2021 No Yes I10 Essential (primary) hypertension 11/22/2021 No Yes Z79.01 Long term (current) use of anticoagulants 11/22/2021 No Yes 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 11/22/2021 No Yes Inactive  Problems Resolved Problems Electronic Signature(s) Signed: 04/16/2022 3:18:25 PM By: [name] PA-C Entered By: [name] on 04/16/2022 15:18:24 -------------------------------------------------------------------------------- Progress Note Details Patient Name: Date of Service: [name] [facility] 04/16/2022 3:15 PM Medical Record Number: [id number] Patient Account Number: [id number] Date of Birth/Sex: Treating RN: 1956-02-09 (66 y.o. [name] Primary Care Provider: [name] Other Clinician: Massie Kluver Referring Provider: Treating Provider/Extender: [name]: 20 Subjective Chief Complaint Information obtained from Patient Sacral, right gluteal, and bilateral heel ulcers History of Present Illness (HPI) FAYEZ, STURGELL ([id number]) [id number] Page 7 of 10 05/29/2021 this is a patient who presents today for initial inspection here in the clinic concerning wounds that he has over the right heel and the sacral region. Unfortunately the sacral wound is starting to spread off to the right gluteal location due to how he sits always leaning towards the right side in his chair. His wife is present she is the primary caregiver though she is not home with him all the time she does have to work. She does do an excellent job however it appears to be in trying to keep things under good control for him. The patient is not able to change positions himself nor walk by himself so he is pretty much at the mercy of the position he is putting when she is gone and this tends to be his chair which she sits and most of the day. Obviously this I think is the main culprit for what is going on currently. It was actually in January 2020 when the sacral wound started. It was in September 2022 when the wound started to spread more to the right gluteal location. Subsequently in August 2022 is when he had been  in a skilled nursing facility and the heel started to give him trouble as well. That does not seem to be doing nearly as poorly as the sacral region. He was hospitalized in October 2022 secondary to sepsis and this was in regard to the foot and was sent to skilled nursing again he is now back at home. He did have a wound VAC for the sacral wound over the summer 2022 but being in and out of facilities this ended up getting sent back. The patient does have [facility] home health that comes out 1 time per week to help with care. His most recent hemoglobin A1c was 6.9 in August 2022. 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 s. 07/24/2021 upon evaluation today patient appears to be doing decently well in regard to the pressure ulcer on the right heel as well as the sacral region. In general I think you are making some progress here which is great news. Overall the heel unfortunately had already closed previously when we saw him although it apparently reopened when he was working with physical therapy according to his wife. The area in the sacral region is doing well and looks clean there is still some depth here but I still think it would be difficult to wound  VAC this region. His wife does an awesome job taking care of him. Is been so long since we have seen him because he has been in the hospital to be honest. 08/07/2021 upon evaluation today patient appears to be doing well at this time. Fortunately I do not see any signs of active infection locally or systemically at this time which is great news. No fevers, chills, nausea, vomiting, or diarrhea. Unfortunately after I saw him on the 24th he actually ended up in the hospital in the 27th due to being septic. This was not due to the wounds but after  looking at records actually due to a UTI. Fortunately he is doing much better and very happy in that regard. I do not see any signs of infection locally nor systemically at this time. 08/21/2021 upon evaluation today patient appears to be doing well with regard to his wound. Fortunately I think that the sacral region is doing decently well at this point which is great news. With regard to the foot this also does have some slough noted but I feel like we are making progress here. He does have some irritation around the right upper thigh/gluteal region. I feel like it is more towards the thigh. Nonetheless this does appear to be pressure related he spends a lot of time sitting up his caregiver states he really will not get in the bed and stay there like he should. 09/04/2021 upon evaluation patient appears to be doing decently well today in regard to the wound on his heel as well as the sacral area. I am actually very pleased with both and how things are appearing currently. There does not appear to be any evidence of active infection I think that his caregiver is doing an awesome job with regard to the wound care here. She is present today as well and I did discuss this with her. 09/25/2021 upon evaluation today patient appears to be doing well with regard to his wound on the sacral region. His right heel is also doing well. Unfortunately he has a new area on his left heel which does appear to be pressure injury. I do not see any evidence of active infection locally or systemically which is great news no fevers, chills, nausea, vomiting, or diarrhea. Readmission: 11-22-2021 upon evaluation today patient presents for reevaluation here in the clinic concerning issues with his wounds. Since have last seen him he was in the hospital and then subsequently ended up in a skilled nursing facility. Subsequently period of time in the facility he actually had a breakdown in the right thigh location which has been an  issue for him. Fortunately I do not see any evidence of active infection locally or systemically which is great news and I am very pleased in that regard. Nonetheless he does have still the wounds on both heels as well as the wound in the sacral area and the new area in the right upper thigh/gluteal region. 12-13-2021 upon evaluation today patient appears to be doing well currently in regard to his wounds. The left heel is going to require some sharp debridement. Fortunately the right heel seems to be doing quite well. Overall I think his gluteal region as well as sacral region also are doing well. 01-10-2022 upon evaluation today patient appears to be doing better in regard to some areas unfortunately his sacral area is not doing as well. It seems of gotten worse his caregiver states when he was in the hospital recently they really did  not offloading. Fortunately I do not see any evidence of active infection locally or systemically at this time. 02-01-2022 upon evaluation today patient appears to be doing well currently in regard to his wounds. He is going require some sharp debridement of clearway some of the necrotic debris. Fortunately I do not see any evidence of active infection locally or systemically which is great news. No fevers, chills, nausea, vomiting, or diarrhea. 02-22-2022 upon evaluation today patient appears to be doing poorly in regard to his right heel the left heel is completely closed. Unfortunately I think he is got some deep tissue injury to this area I am going to do a culture at this spot. With that being said he is having 2 other significant issues in regard to the left thigh this is showing signs of cellulitis all the way down into the thigh and was seeing definite temperature difference in the left thigh versus the right thigh when feeling. It also is of note that the patient is also having some issues here with trouble in regard to having fevers his wife has been giving him  Tylenol and ibuprofen to help keep the fevers under control but he has been as high as 102 this is pretty much been going on since Monday or Tuesday of this week. Nonetheless I am concerned I am not exactly sure where the cellulitis on the left thigh is coming from not sure if this is emanating from his sacral region that is a possibility or if there is something completely different going on either way with the fevers I am more worried that he needs to go get this checked out as soon as possible. 03-08-2022 upon evaluation today patient appears to be doing well with regard to his heel as well as sacral region. Both are showing signs of significant improvement compared to the last time I saw him. Overall I think we are in a much better spot 04-04-2022 upon evaluation today patient appears to be doing about the same in regard to his wounds. I do not see any signs of improvement nor worsening at this point. Fortunately there is no evidence of active infection at this time. He is not staying off of this is much as he needs to in fact is not even sleeping in the hospital bed with the alternating air mattress. 04-16-2022 upon evaluation today patient appears to be doing well currently in regard to his wound. He has been tolerating the dressing changes without complication. Fortunately I do not see any evidence of infection. The heel actually looks quite well the sacral area actually is still quite deep. From my questioning and discussion with him today I do not believe that he is really staying off of this as much as he should. He seemed a little uncomfortable during that portion of the conversation today. I think that is contributing quite a bit to his lack of progress with regard to healing. Objective JAC, ROMULUS ([id number]) [id number] Page 8 of 10 Constitutional Well-nourished and well-hydrated in no acute distress. Vitals Time Taken: 3:23 PM, Height: 66 in,  Weight: 279 lbs, BMI: 45, Temperature: 97.9 F, Pulse: 80 bpm, Respiratory Rate: 18 breaths/min, Blood Pressure: 164/94 mmHg. Respiratory normal breathing without difficulty. Psychiatric this patient is able to make decisions and demonstrates good insight into disease process. Alert and Oriented x 3. pleasant and cooperative. General Notes: Upon inspection patient's wound bed actually showed signs of good granulation and epithelization at this point. Fortunately I do not see any  evidence of active infection locally or systemically which is great news and overall I am extremely pleased with where we stand. Especially in regard to the heel. Nonetheless in regard to the sacral region I really feel like that he may still be up on this way too much and I think that we need to try and see what can be done about him staying off of it more I had a conversation again with him today about making sure to avoid sitting long periods of time. He voiced understanding but again I am not sure how much she is really committing to this to be honest. Integumentary (Hair, Skin) Wound #5 status is Open. Original cause of wound was Pressure Injury. The date acquired was: 07/01/2021. The wound has been in treatment 20 weeks. The wound is located on the Right Calcaneus. The wound measures 3.6cm length x 4.1cm width x 0.3cm depth; 11.592cm^2 area and 3.478cm^3 volume. There is Fat Layer (Subcutaneous Tissue) exposed. There is a medium amount of serosanguineous drainage noted. There is medium (34-66%) red, pink granulation within the wound bed. There is a medium (34-66%) amount of necrotic tissue within the wound bed including Adherent Slough. Wound #7 status is Open. Original cause of wound was Pressure Injury. The date acquired was: 07/01/2021. The wound has been in treatment 20 weeks. The wound is located on the Sacrum. The wound measures 2.9cm length x 2cm width x 3.5cm depth; 4.555cm^2 area and 15.944cm^3 volume. There is  Fat Layer (Subcutaneous Tissue) exposed. There is a medium amount of serosanguineous drainage noted. There is large (67-100%) red granulation within the wound bed. There is no necrotic tissue within the wound bed. Assessment Active Problems ICD-10 Pressure ulcer of right heel, stage 3 Pressure ulcer of left heel, stage 3 Pressure ulcer of sacral region, stage 4 Irritant contact dermatitis due to friction or contact with body fluids, unspecified Non-pressure chronic ulcer of buttock with fat layer exposed Muscle weakness (generalized) Bipolar disorder, unspecified Type 2 diabetes mellitus with other skin ulcer Essential (primary) hypertension Long term (current) use of anticoagulants Personal history of traumatic brain injury Procedures Wound #5 Pre-procedure diagnosis of Wound #5 is a Pressure Ulcer located on the Right Calcaneus . There was a Excisional Skin/Subcutaneous Tissue Debridement with a total area of 14.76 sq cm performed by Tommie Sams., PA-C. With the following instrument(s): Curette to remove Viable and Non-Viable tissue/material. Material removed includes Subcutaneous Tissue and Slough and. A time out was conducted at 15:47, prior to the start of the procedure. A Minimum amount of bleeding was controlled with Pressure. The procedure was tolerated well. Post Debridement Measurements: 3.6cm length x 4.1cm width x 0.3cm depth; 3.478cm^3 volume. Post debridement Stage noted as Category/Stage III. Character of Wound/Ulcer Post Debridement is stable. Post procedure Diagnosis Wound #5: Same as Pre-Procedure Plan Follow-up Appointments: Return Appointment in 3 weeks. Home Health: [facility] for wound care. May utilize formulary equivalent dressing for wound treatment orders unless otherwise specified. Home Health Nurse may visit PRN to address patientoos wound care needs. Kathaleen Bury phone [phone] fax [phone] Scheduled days for dressing changes to be  completed; exception, patient has scheduled wound care visit that day. **Please direct any NON-WOUND related issues/requests for orders to patient's Primary Care Physician. **If current dressing causes regression in wound condition, may D/C ordered dressing product/s and apply Normal Saline Moist Dressing daily until next [place] or Other MD appointment. **Notify [facility] of regression in wound condition at [phone].  Bathing/ Shower/ Hygiene: May shower with wound dressing protected with water repellent cover or cast protector. No tub bath. Anesthetic (Use 'Patient Medications' Section for Anesthetic Order Entry): Lidocaine applied to wound bed Cabler, [name] ([id number]) [id number] Page 9 of 10 Edema Control - Lymphedema / Segmental Compressive Device / Other: Elevate, Exercise Daily and Avoid Standing for Long Periods of Time. Elevate legs to the level of the heart and pump ankles as often as possible Elevate leg(s) parallel to the floor when sitting. DO YOUR BEST to sleep in the bed at night. DO NOT sleep in your recliner. Long hours of sitting in a recliner leads to swelling of the legs and/or potential wounds on your backside. Off-Loading: Gel wheelchair cushion Low air-loss mattress (Group 2) - Sleep in bed every night. Turn and reposition every 2 hours - keep pressure off of the sacrum and heels wounds Other: - PRAFO boot in bed keep pressure off of sacrum/gluteus and heels- Additional Orders / Instructions: Follow Nutritious Diet and Increase Protein Intake WOUND #5: - Calcaneus Wound Laterality: Right Prim Dressing: Hydrofera Blue Ready Transfer Foam, 2.5x2.5 (in/in) 3 x Per Week/30 Days ary Discharge Instructions: Apply Hydrofera Blue Ready to wound bed as directed Secondary Dressing: ABD Pad 5x9 (in/in) 3 x Per Week/30 Days Discharge Instructions: Cover with ABD pad Secured With: Medipore T - 77M Medipore H Soft  Cloth Surgical T ape ape, 2x2 (in/yd) 3 x Per Week/30 Days Secured With: Conform 4'' - Conforming Stretch Gauze Bandage 4x75 (in/in) 3 x Per Week/30 Days Discharge Instructions: Apply as directed WOUND #7: - Sacrum Wound Laterality: Topical: calmoseptine 1 x Per Day/30 Days Discharge Instructions: apply to excorated areas Prim Dressing: Hydrofera Blue Ready Transfer Foam, 4x5 (in/in) 1 x Per Day/30 Days ary Discharge Instructions: spiral hydrofera blue to lightly pack into wound, then cut to cover wound Secondary Dressing: ABD Pad 5x9 (in/in) 1 x Per Day/30 Days Discharge Instructions: Cover with ABD pad Secured With: Medipore T - 77M Medipore H Soft Cloth Surgical T ape ape, 2x2 (in/yd) 1 x Per Day/30 Days 1. I am going to suggest that we have the patient continue to monitor for any signs of worsening or infection. Obviously right now I do believe that he is making progress here and I am pleased in that regard. With that being said I do believe that he also is going to require some aggressive offloading in regard to the sacral area to see this continue to heal. 2. I am going to stick with the [facility] for both areas I think that still probably our best option based on what I am seeing. We will see patient back for reevaluation in 3 weeks here in the clinic. If anything worsens or changes patient will contact our office for additional recommendations. Electronic Signature(s) Signed: 04/16/2022 3:54:37 PM By: [name] PA-C Entered By: [name] on 04/16/2022 15:54:36 -------------------------------------------------------------------------------- SuperBill Details Patient Name: Date of Service: [name] [facility] 04/16/2022 Medical Record Number: [id number] Patient Account Number: [id number] Date of Birth/Sex: Treating RN: [birth year] (66 y.o. [name] Primary Care Provider: [name] Other Clinician: Massie Kluver Referring Provider: Treating  Provider/Extender: [name]: 20 Diagnosis Coding ICD-10 Codes Code Description [phone] Pressure ulcer of right heel, stage 3 L89.623 Pressure ulcer of left heel, stage 3 L89.154 Pressure ulcer of sacral region, stage 4 L24.A0 Irritant contact dermatitis due to friction or contact with body fluids, unspecified L98.412 Non-pressure chronic ulcer of buttock  with fat layer exposed M62.81 Muscle weakness (generalized) F31.9 Bipolar disorder, unspecified Vanderveer, [name] ([id number]) [id number] Page 10 of 10 E11.622 Type 2 diabetes mellitus with other skin ulcer I10 Essential (primary) hypertension Z79.01 Long term (current) use of anticoagulants 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 Facility Procedures : CPT4 Code: [id number] Description: 47998 - DEB SUBQ TISSUE 20 SQ CM/< ICD-10 Diagnosis Description L89.613 Pressure ulcer of right heel, stage 3 Modifier: Quantity: 1 Physician Procedures : CPT4 Code Description Modifier [id number] - WC PHYS SUBQ TISS 20 SQ CM ICD-10 Diagnosis Description L89.613 Pressure ulcer of right heel, stage 3 Quantity: 1 Electronic Signature(s) Signed: 04/16/2022 3:55:11 PM By: [name] PA-C Entered By: [name] on 04/16/2022 15:55:10

## 2022-05-07 ENCOUNTER — Encounter: Payer: Medicare Other | Attending: Physician Assistant | Admitting: Internal Medicine

## 2022-05-07 DIAGNOSIS — L24A0 Irritant contact dermatitis due to friction or contact with body fluids, unspecified: Secondary | ICD-10-CM | POA: Insufficient documentation

## 2022-05-07 DIAGNOSIS — L89623 Pressure ulcer of left heel, stage 3: Secondary | ICD-10-CM | POA: Diagnosis not present

## 2022-05-07 DIAGNOSIS — L89154 Pressure ulcer of sacral region, stage 4: Secondary | ICD-10-CM | POA: Insufficient documentation

## 2022-05-07 DIAGNOSIS — L98412 Non-pressure chronic ulcer of buttock with fat layer exposed: Secondary | ICD-10-CM | POA: Diagnosis not present

## 2022-05-07 DIAGNOSIS — E11622 Type 2 diabetes mellitus with other skin ulcer: Secondary | ICD-10-CM | POA: Insufficient documentation

## 2022-05-07 DIAGNOSIS — L89613 Pressure ulcer of right heel, stage 3: Secondary | ICD-10-CM | POA: Insufficient documentation

## 2022-05-07 DIAGNOSIS — M6281 Muscle weakness (generalized): Secondary | ICD-10-CM | POA: Diagnosis not present

## 2022-05-07 DIAGNOSIS — Z86718 Personal history of other venous thrombosis and embolism: Secondary | ICD-10-CM | POA: Diagnosis not present

## 2022-05-07 DIAGNOSIS — I10 Essential (primary) hypertension: Secondary | ICD-10-CM | POA: Insufficient documentation

## 2022-05-07 DIAGNOSIS — Z5982 Transportation insecurity: Secondary | ICD-10-CM | POA: Insufficient documentation

## 2022-05-07 DIAGNOSIS — Z7901 Long term (current) use of anticoagulants: Secondary | ICD-10-CM | POA: Insufficient documentation

## 2022-05-07 DIAGNOSIS — Z87820 Personal history of traumatic brain injury: Secondary | ICD-10-CM | POA: Diagnosis not present

## 2022-05-08 NOTE — Progress Notes (Signed)
RUXIN, RANSOME ([id number]) [id number] Page 1 of 8 Visit Report for 05/07/2022 HPI Details Patient Name: Date of Service: [name] [facility] 05/07/2022 8:00 A M Medical Record Number: [id number] Patient Account Number: [id number] Date of Birth/Sex: Treating RN: [birth year] (66 y.o. [name]) [name] Primary Care Provider: [name] Other Clinician: Referring Provider: Treating Provider/Extender: RO BSO Delane Ginger, MICHA EL Preston Fleeting in Treatment: 23 History of Present Illness HPI Description: 05/29/2021 this is a patient who presents today for initial inspection here in the clinic concerning wounds that he has over the right heel and the sacral region. Unfortunately the sacral wound is starting to spread off to the right gluteal location due to how he sits always leaning towards the right side in his chair. His wife is present she is the primary caregiver though she is not home with him all the time she does have to work. She does do an excellent job however it appears to be in trying to keep things under good control for him. The patient is not able to change positions himself nor walk by himself so he is pretty much at the mercy of the position he is putting when she is gone and this tends to be his chair which she sits and most of the day. Obviously this I think is the main culprit for what is going on currently. It was actually in January 2020 when the sacral wound started. It was in September 2022 when the wound started to spread more to the right gluteal location. Subsequently in August 2022 is when he had been in a skilled nursing facility and the heel started to give him trouble as well. That does not seem to be doing nearly as poorly as the sacral region. He was hospitalized in October 2022 secondary to sepsis and this was in regard to the foot and was sent to skilled nursing again he is now back at home. He did have a wound VAC for the  sacral wound over the summer 2022 but being in and out of facilities this ended up getting sent back. The patient does have [facility] home health that comes out 1 time per week to help with care. His most recent hemoglobin A1c was 6.9 in August 2022. 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 s. 07/24/2021 upon evaluation today patient appears to be doing decently well in regard to the pressure ulcer on the right heel as well as the sacral region. In general I think you are making some progress here which is great news. Overall the heel unfortunately had already closed previously when we saw him although it apparently reopened when he was working with physical therapy according to his wife. The area in the sacral region is doing well and looks clean there is still some depth here but I still think it would be difficult to wound VAC this region. His wife does an awesome job taking care of him. Is been so long since we have seen him because he has been in the hospital to be honest. 08/07/2021 upon evaluation today patient appears to be doing well at this time. Fortunately I do not see any signs of active infection locally or systemically at this time which is great news. No fevers, chills, nausea, vomiting, or diarrhea. Unfortunately after I saw him on the 24th  he actually ended up in the hospital in the 27th due to being septic. This was not due to the wounds but after looking at records actually due to a UTI. Fortunately he is doing much better and very happy in that regard. I do not see any signs of infection locally nor systemically at this time. 08/21/2021 upon evaluation today patient appears to be doing well with regard to his wound. Fortunately I think that the sacral region is doing decently  well at this point which is great news. With regard to the foot this also does have some slough noted but I feel like we are making progress here. He does have some irritation around the right upper thigh/gluteal region. I feel like it is more towards the thigh. Nonetheless this does appear to be pressure related he spends a lot of time sitting up his caregiver states he really will not get in the bed and stay there like he should. 09/04/2021 upon evaluation patient appears to be doing decently well today in regard to the wound on his heel as well as the sacral area. I am actually very pleased with both and how things are appearing currently. There does not appear to be any evidence of active infection I think that his caregiver is doing an awesome job with regard to the wound care here. She is present today as well and I did discuss this with her. 09/25/2021 upon evaluation today patient appears to be doing well with regard to his wound on the sacral region. His right heel is also doing well. Unfortunately he has a new area on his left heel which does appear to be pressure injury. I do not see any evidence of active infection locally or systemically which is great news no fevers, chills, nausea, vomiting, or diarrhea. Readmission: 11-22-2021 upon evaluation today patient presents for reevaluation here in the clinic concerning issues with his wounds. Since have last seen him he was in the hospital and then subsequently ended up in a skilled nursing facility. Subsequently period of time in the facility he actually had a breakdown in the right thigh location which has been an issue for him. Fortunately I do not see any evidence of active infection locally or systemically which is great news and I am very pleased in that regard. Nonetheless he does have still the wounds on both heels as well as the wound in the sacral area and the new area in the right upper thigh/gluteal region. 12-13-2021 upon evaluation  today patient appears to be doing well currently in regard to his wounds. The left heel is going to require some sharp debridement. Fortunately the right heel seems to be doing quite well. Overall I think his gluteal region as well as sacral region also are doing well. 01-10-2022 upon evaluation today patient appears to be doing better in regard to some areas unfortunately his sacral area is not doing as well. It seems of gotten worse his caregiver states when he was in the hospital recently they really did not offloading. Fortunately I do not see any evidence of active infection locally or systemically at this time. 02-01-2022 upon evaluation today patient appears to be doing well currently in regard to his wounds. He is going require some sharp debridement of clearway some of the necrotic debris. Fortunately I do not see any evidence of active infection locally or systemically which is great news. No fevers, chills, nausea, Breth, [name] ([id number]) [id number] Page 2 of 8 vomiting, or diarrhea.  02-22-2022 upon evaluation today patient appears to be doing poorly in regard to his right heel the left heel is completely closed. Unfortunately I think he is got some deep tissue injury to this area I am going to do a culture at this spot. With that being said he is having 2 other significant issues in regard to the left thigh this is showing signs of cellulitis all the way down into the thigh and was seeing definite temperature difference in the left thigh versus the right thigh when feeling. It also is of note that the patient is also having some issues here with trouble in regard to having fevers his wife has been giving him Tylenol and ibuprofen to help keep the fevers under control but he has been as high as 102 this is pretty much been going on since Monday or Tuesday of this week. Nonetheless I am concerned I am not exactly sure where the cellulitis on the left thigh  is coming from not sure if this is emanating from his sacral region that is a possibility or if there is something completely different going on either way with the fevers I am more worried that he needs to go get this checked out as soon as possible. 03-08-2022 upon evaluation today patient appears to be doing well with regard to his heel as well as sacral region. Both are showing signs of significant improvement compared to the last time I saw him. Overall I think we are in a much better spot 04-04-2022 upon evaluation today patient appears to be doing about the same in regard to his wounds. I do not see any signs of improvement nor worsening at this point. Fortunately there is no evidence of active infection at this time. He is not staying off of this is much as he needs to in fact is not even sleeping in the hospital bed with the alternating air mattress. 04-16-2022 upon evaluation today patient appears to be doing well currently in regard to his wound. He has been tolerating the dressing changes without complication. Fortunately I do not see any evidence of infection. The heel actually looks quite well the sacral area actually is still quite deep. From my questioning and discussion with him today I do not believe that he is really staying off of this as much as he should. He seemed a little uncomfortable during that portion of the conversation today. I think that is contributing quite a bit to his lack of progress with regard to healing. 11/7; patient with decubitus ulcers on his right heel as well as his lower sacrum. We have been using Hydrofera Blue. He has home health predominantly for Foley catheter management. There have been some issues getting the Hydrofera Blue through home health but apparently the patient's wife has some supplies and is managing to get these changed.They come every 3 weeks because of transportation difficulties Electronic Signature(s) Signed: 05/07/2022 5:01:28 PM By:  Linton Ham MD Entered By: Linton Ham on 05/07/2022 08:31:33 -------------------------------------------------------------------------------- Physical Exam Details Patient Name: Date of Service: [name] [facility] 05/07/2022 8:00 A M Medical Record Number: [id number] Patient Account Number: [id number] Date of Birth/Sex: Treating RN: March 29, 1956 (65 y.o. Oval Linsey Primary Care Provider: [name] Other Clinician: Referring Provider: Treating Provider/Extender: RO BSO Delane Ginger, MICHA EL Preston Fleeting in Treatment: 23 Constitutional Patient is hypertensive.. Pulse regular and within target range for patient.Marland Kitchen Respirations regular, non-labored and within target range.. Temperature is normal and within the target range  for the patient.Marland Kitchen appears in no distress. Notes Wound exam; his wounds look really quite good in both aspects. The right heel has healthy looking granulation no debridement is required. The lower sacrum wound has depth but again the granulation looks fairly healthy. There is no evidence of infection in either area Electronic Signature(s) Signed: 05/07/2022 5:01:28 PM By: Linton Ham MD Entered By: Linton Ham on 05/07/2022 08:32:16 [place]lett, [name] ([id number]) [id number] Page 3 of 8 -------------------------------------------------------------------------------- Physician Orders Details Patient Name: Date of Service: [name] [facility] 05/07/2022 8:00 A M Medical Record Number: [id number] Patient Account Number: [id number] Date of Birth/Sex: Treating RN: June 01, 1956 (65 y.o. [name]) [name] Primary Care Provider: [name] Other Clinician: Referring Provider: Treating Provider/Extender: RO BSO Delane Ginger, MICHA EL Preston Fleeting in Treatment: 23 Verbal / Phone Orders: No Diagnosis Coding Follow-up Appointments Return Appointment in 3 weeks. [place] for wound  care. May utilize formulary equivalent dressing for wound treatment orders unless otherwise specified. Home Health Nurse may visit PRN to address patients wound care needs. Kathaleen Bury phone [phone] fax [phone] Scheduled days for dressing changes to be completed; exception, patient has scheduled wound care visit that day. **Please direct any NON-WOUND related issues/requests for orders to patient's Primary Care Physician. **If current dressing causes regression in wound condition, may D/C ordered dressing product/s and apply Normal Saline Moist Dressing daily until next [place] or Other MD appointment. **Notify [facility] of regression in wound condition at [phone]. Bathing/ Shower/ Hygiene May shower with wound dressing protected with water repellent cover or cast protector. No tub bath. Anesthetic (Use 'Patient Medications' Section for Anesthetic Order Entry) Lidocaine applied to wound bed Edema Control - Lymphedema / Segmental Compressive Device / Other Elevate, Exercise Daily and A void Standing for Long Periods of Time. Elevate legs to the level of the heart and pump ankles as often as possible Elevate leg(s) parallel to the floor when sitting. DO YOUR BEST to sleep in the bed at night. DO NOT sleep in your recliner. Long hours of sitting in a recliner leads to swelling of the legs and/or potential wounds on your backside. Off-Loading Gel wheelchair cushion Low air-loss mattress (Group 2) - Sleep in bed every night. Turn and reposition every 2 hours - keep pressure off of the sacrum and heels wounds Other: - PRAFO boot in bed keep pressure off of sacrum/gluteus and heels- Additional Orders / Instructions Follow Nutritious Diet and Increase Protein Intake Wound Treatment Wound #5 - Calcaneus Wound Laterality: Right Prim Dressing: Hydrofera Blue Ready Transfer Foam, 2.5x2.5 (in/in) 3 x Per Week/30 Days ary Discharge Instructions: Apply Hydrofera  Blue Ready to wound bed as directed Secondary Dressing: ABD Pad 5x9 (in/in) 3 x Per Week/30 Days Discharge Instructions: Cover with ABD pad Secured With: Medipore T - 11M Medipore H Soft Cloth Surgical T ape ape, 2x2 (in/yd) 3 x Per Week/30 Days Secured With: Conform 4'' - Conforming Stretch Gauze Bandage 4x75 (in/in) 3 x Per Week/30 Days Discharge Instructions: Apply as directed Wound #7 - Sacrum Topical: calmoseptine 1 x Per Day/30 Days Discharge Instructions: apply to excorated areas Prim Dressing: Hydrofera Blue Ready Transfer Foam, 4x5 (in/in) 1 x Per Day/30 Days ary Discharge Instructions: spiral hydrofera blue to lightly pack into wound, then cut to cover wound [name] ([id number]) [id number] Page 4 of 8 Secondary Dressing: ABD Pad 5x9 (in/in) 1 x Per Day/30 Days Discharge Instructions: Cover with ABD pad Secured With: Medipore T - 11M  Medipore H Soft Cloth Surgical T ape ape, 2x2 (in/yd) 1 x Per Day/30 Days Electronic Signature(s) Signed: 05/07/2022 4:53:18 PM By: [name] RN Signed: 05/07/2022 5:01:28 PM By: Linton Ham MD Entered By: [name] on 05/07/2022 08:25:49 -------------------------------------------------------------------------------- Problem List Details Patient Name: Date of Service: [name] [facility] 05/07/2022 8:00 A M Medical Record Number: [id number] Patient Account Number: [id number] Date of Birth/Sex: Treating RN: August 22, 1955 (65 y.o. Oval Linsey Primary Care Provider: [name] Other Clinician: Referring Provider: Treating Provider/Extender: RO BSO N, MICHA EL Preston Fleeting in Treatment: 23 Active Problems ICD-10 Encounter Code Description Active Date MDM Diagnosis L89.613 Pressure ulcer of right heel, stage 3 11/22/2021 No Yes L89.623 Pressure ulcer of left heel, stage 3 11/22/2021 No Yes L89.154 Pressure ulcer of sacral region, stage 4 11/22/2021 No Yes L24.A0 Irritant  contact dermatitis due to friction or contact with body fluids, 11/22/2021 No Yes unspecified L98.412 Non-pressure chronic ulcer of buttock with fat layer exposed 11/22/2021 No Yes M62.81 Muscle weakness (generalized) 11/22/2021 No Yes F31.9 Bipolar disorder, unspecified 11/22/2021 No Yes E11.622 Type 2 diabetes mellitus with other skin ulcer 11/22/2021 No Yes I10 Essential (primary) hypertension 11/22/2021 No Yes [place], [name] ([id number]) [id number] Page 5 of 8 Z79.01 Long term (current) use of anticoagulants 11/22/2021 No Yes 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 11/22/2021 No Yes Inactive Problems Resolved Problems Electronic Signature(s) Signed: 05/07/2022 5:01:28 PM By: Linton Ham MD Entered By: Linton Ham on 05/07/2022 08:30:25 -------------------------------------------------------------------------------- Progress Note Details Patient Name: Date of Service: [name] [facility] 05/07/2022 8:00 A M Medical Record Number: [id number] Patient Account Number: [id number] Date of Birth/Sex: Treating RN: 01-29-1956 (65 y.o. [name]) [name] Primary Care Provider: [name] Other Clinician: Referring Provider: Treating Provider/Extender: RO BSO N, MICHA EL Preston Fleeting in Treatment: 23 Subjective History of Present Illness (HPI) 05/29/2021 this is a patient who presents today for initial inspection here in the clinic concerning wounds that he has over the right heel and the sacral region. Unfortunately the sacral wound is starting to spread off to the right gluteal location due to how he sits always leaning towards the right side in his chair. His wife is present she is the primary caregiver though she is not home with him all the time she does have to work. She does do an excellent job however it appears to be in trying to keep things under good control for him. The patient is not able to change positions himself  nor walk by himself so he is pretty much at the mercy of the position he is putting when she is gone and this tends to be his chair which she sits and most of the day. Obviously this I think is the main culprit for what is going on currently. It was actually in January 2020 when the sacral wound started. It was in September 2022 when the wound started to spread more to the right gluteal location. Subsequently in August 2022 is when he had been in a skilled nursing facility and the heel started to give him trouble as well. That does not seem to be doing nearly as poorly as the sacral region. He was hospitalized in October 2022 secondary to sepsis and this was in regard to the foot and was sent to skilled nursing again he is now back at home. He did have a wound VAC for the sacral wound over the summer 2022 but being in and out of facilities this ended  up getting sent back. The patient does have [facility] home health that comes out 1 time per week to help with care. His most recent hemoglobin A1c was 6.9 in August 2022. 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 s. 07/24/2021 upon evaluation today patient appears to be doing decently well in regard to the pressure ulcer on the right heel as well as the sacral region. In general I think you are making some progress here which is great news. Overall the heel unfortunately had already closed previously when we saw him although it apparently reopened when he was working with physical therapy according to his wife. The area in the sacral region is doing well and looks clean there is still some depth here but I still think it would be difficult to wound VAC this region. His wife does an awesome job taking care of him. Is been so long since we have seen him  because he has been in the hospital to be honest. 08/07/2021 upon evaluation today patient appears to be doing well at this time. Fortunately I do not see any signs of active infection locally or systemically at this time which is great news. No fevers, chills, nausea, vomiting, or diarrhea. Unfortunately after I saw him on the 24th he actually ended up in the hospital in the 27th due to being septic. This was not due to the wounds but after looking at records actually due to a UTI. Fortunately he is doing much better and very happy in that regard. I do not see any signs of infection locally nor systemically at this time. 08/21/2021 upon evaluation today patient appears to be doing well with regard to his wound. Fortunately I think that the sacral region is doing decently well at this point which is great news. With regard to the foot this also does have some slough noted but I feel like we are making progress here. He does have some irritation around the right upper thigh/gluteal region. I feel like it is more towards the thigh. Nonetheless this does appear to be pressure related he spends a lot of time sitting up his caregiver states he really will not get in the bed and stay there like he should. 09/04/2021 upon evaluation patient appears to be doing decently well today in regard to the wound on his heel as well as the sacral area. I am actually very pleased with both and how things are appearing currently. There does not appear to be any evidence of active infection I think that his caregiver is doing an awesome job with regard to the wound care here. She is present today as well and I did discuss this with her. AMARIS, GARRETTE ([id number]) [id number] Page 6 of 8 09/25/2021 upon evaluation today patient appears to be doing well with regard to his wound on the sacral region. His right heel is also doing well. Unfortunately he has a new area on his left heel which does  appear to be pressure injury. I do not see any evidence of active infection locally or systemically which is great news no fevers, chills, nausea, vomiting, or diarrhea. Readmission: 11-22-2021 upon evaluation today patient presents for reevaluation here in the clinic concerning issues with his wounds. Since have last seen him he was in the hospital and then subsequently ended up in a skilled nursing  facility. Subsequently period of time in the facility he actually had a breakdown in the right thigh location which has been an issue for him. Fortunately I do not see any evidence of active infection locally or systemically which is great news and I am very pleased in that regard. Nonetheless he does have still the wounds on both heels as well as the wound in the sacral area and the new area in the right upper thigh/gluteal region. 12-13-2021 upon evaluation today patient appears to be doing well currently in regard to his wounds. The left heel is going to require some sharp debridement. Fortunately the right heel seems to be doing quite well. Overall I think his gluteal region as well as sacral region also are doing well. 01-10-2022 upon evaluation today patient appears to be doing better in regard to some areas unfortunately his sacral area is not doing as well. It seems of gotten worse his caregiver states when he was in the hospital recently they really did not offloading. Fortunately I do not see any evidence of active infection locally or systemically at this time. 02-01-2022 upon evaluation today patient appears to be doing well currently in regard to his wounds. He is going require some sharp debridement of clearway some of the necrotic debris. Fortunately I do not see any evidence of active infection locally or systemically which is great news. No fevers, chills, nausea, vomiting, or diarrhea. 02-22-2022 upon evaluation today patient appears to be doing poorly in regard to his right heel the left  heel is completely closed. Unfortunately I think he is got some deep tissue injury to this area I am going to do a culture at this spot. With that being said he is having 2 other significant issues in regard to the left thigh this is showing signs of cellulitis all the way down into the thigh and was seeing definite temperature difference in the left thigh versus the right thigh when feeling. It also is of note that the patient is also having some issues here with trouble in regard to having fevers his wife has been giving him Tylenol and ibuprofen to help keep the fevers under control but he has been as high as 102 this is pretty much been going on since Monday or Tuesday of this week. Nonetheless I am concerned I am not exactly sure where the cellulitis on the left thigh is coming from not sure if this is emanating from his sacral region that is a possibility or if there is something completely different going on either way with the fevers I am more worried that he needs to go get this checked out as soon as possible. 03-08-2022 upon evaluation today patient appears to be doing well with regard to his heel as well as sacral region. Both are showing signs of significant improvement compared to the last time I saw him. Overall I think we are in a much better spot 04-04-2022 upon evaluation today patient appears to be doing about the same in regard to his wounds. I do not see any signs of improvement nor worsening at this point. Fortunately there is no evidence of active infection at this time. He is not staying off of this is much as he needs to in fact is not even sleeping in the hospital bed with the alternating air mattress. 04-16-2022 upon evaluation today patient appears to be doing well currently in regard to his wound. He has been tolerating the dressing changes without complication. Fortunately I  do not see any evidence of infection. The heel actually looks quite well the sacral area actually is  still quite deep. From my questioning and discussion with him today I do not believe that he is really staying off of this as much as he should. He seemed a little uncomfortable during that portion of the conversation today. I think that is contributing quite a bit to his lack of progress with regard to healing. 11/7; patient with decubitus ulcers on his right heel as well as his lower sacrum. We have been using Hydrofera Blue. He has home health predominantly for Foley catheter management. There have been some issues getting the Hydrofera Blue through home health but apparently the patient's wife has some supplies and is managing to get these changed.They come every 3 weeks because of transportation difficulties Objective Constitutional Patient is hypertensive.. Pulse regular and within target range for patient.Marland Kitchen Respirations regular, non-labored and within target range.. Temperature is normal and within the target range for the patient.Marland Kitchen appears in no distress. Vitals Time Taken: 8:08 AM, Height: 66 in, Weight: 279 lbs, BMI: 45, Temperature: 98 F, Pulse: 83 bpm, Respiratory Rate: 16 breaths/min, Blood Pressure: 194/97 mmHg. General Notes: Wound exam; his wounds look really quite good in both aspects. The right heel has healthy looking granulation no debridement is required. The lower sacrum wound has depth but again the granulation looks fairly healthy. There is no evidence of infection in either area Integumentary (Hair, Skin) Wound #5 status is Open. Original cause of wound was Pressure Injury. The date acquired was: 07/01/2021. The wound has been in treatment 23 weeks. The wound is located on the Right Calcaneus. The wound measures 4cm length x 2cm width x 0.3cm depth; 6.283cm^2 area and 1.885cm^3 volume. There is Fat Layer (Subcutaneous Tissue) exposed. There is no tunneling or undermining noted. There is a medium amount of serosanguineous drainage noted. There is medium (34-66%) red, pink  granulation within the wound bed. There is a medium (34-66%) amount of necrotic tissue within the wound bed including Adherent Slough. Wound #7 status is Open. Original cause of wound was Pressure Injury. The date acquired was: 07/01/2021. The wound has been in treatment 23 weeks. The wound is located on the Sacrum. The wound measures 2cm length x 2cm width x 3.6cm depth; 3.142cm^2 area and 11.31cm^3 volume. There is Fat Layer (Subcutaneous Tissue) exposed. There is no tunneling or undermining noted. There is a medium amount of serosanguineous drainage noted. There is large (67- 100%) red granulation within the wound bed. There is no necrotic tissue within the wound bed. 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 Home Health: [facility] for wound care. May utilize formulary equivalent dressing for wound treatment orders unless otherwise specified. Home Health Nurse may visit PRN to address patientoos wound care needs. Kathaleen Bury phone [phone] fax [phone] Scheduled days for dressing changes to be completed; exception, patient has scheduled wound care visit that day. **Please direct any NON-WOUND related issues/requests for orders to patient's Primary  Care Physician. **If current dressing causes regression in wound condition, may D/C ordered dressing  product/s and apply Normal Saline Moist Dressing daily until next [place] or Other MD appointment. **Notify [facility]aling Center of regression in wound condition at [phone]. Bathing/ Shower/ Hygiene: May shower with wound dressing protected with water repellent cover or cast protector. No tub bath. Anesthetic (Use 'Patient Medications' Section for Anesthetic Order Entry): Lidocaine applied to wound bed Edema Control - Lymphedema / Segmental Compressive Device / Other: Elevate, Exercise Daily and Avoid Standing for Long Periods of Time. Elevate legs to the level of the heart and pump ankles as often as possible Elevate leg(s) parallel to the floor when sitting. DO YOUR BEST to sleep in the bed at night. DO NOT sleep in your recliner. Long hours of sitting in a recliner leads to swelling of the legs and/or potential wounds on your backside. Off-Loading: Gel wheelchair cushion Low air-loss mattress (Group 2) - Sleep in bed every night. Turn and reposition every 2 hours - keep pressure off of the sacrum and heels wounds Other: - PRAFO boot in bed keep pressure off of sacrum/gluteus and heels- Additional Orders / Instructions: Follow Nutritious Diet and Increase Protein Intake WOUND #5: - Calcaneus Wound Laterality: Right Prim Dressing: Hydrofera Blue Ready Transfer Foam, 2.5x2.5 (in/in) 3 x Per Week/30 Days ary Discharge Instructions: Apply Hydrofera Blue Ready to wound bed as directed Secondary Dressing: ABD Pad 5x9 (in/in) 3 x Per Week/30 Days Discharge Instructions: Cover with ABD pad Secured With: Medipore T - 21M Medipore H Soft Cloth Surgical T ape ape, 2x2 (in/yd) 3 x Per Week/30 Days Secured With: Conform 4'' - Conforming Stretch Gauze Bandage 4x75 (in/in) 3 x Per Week/30 Days Discharge Instructions: Apply as directed WOUND #7: - Sacrum Wound Laterality: Topical: calmoseptine 1 x Per Day/30 Days Discharge Instructions: apply to excorated areas Prim Dressing:  Hydrofera Blue Ready Transfer Foam, 4x5 (in/in) 1 x Per Day/30 Days ary Discharge Instructions: spiral hydrofera blue to lightly pack into wound, then cut to cover wound Secondary Dressing: ABD Pad 5x9 (in/in) 1 x Per Day/30 Days Discharge Instructions: Cover with ABD pad Secured With: Medipore T - 21M Medipore H Soft Cloth Surgical T ape ape, 2x2 (in/yd) 1 x Per Day/30 Days 1. Both wounds look quite good although it was uncertain about whether we have improved in terms of dimensions. 2. I have continued the [facility], with home health present there are not a lot of options. Apparently they cannot be dismissed because of Foley catheter care/maintenance 3. The patient is minimally ambulatory does not wear shoes at home so there should not be any trauma to the wound on his heel which is just beyond the tip of the heel. Electronic Signature(s) Signed: 05/07/2022 5:01:28 PM By: Linton Ham MD Entered By: Linton Ham on 05/07/2022 08:33:26 Harbor, [name] ([id number]) [id number] Page 8 of 8 -------------------------------------------------------------------------------- SuperBill Details Patient Name: Date of Service: [name] [facility] 05/07/2022 Medical Record Number: [id number] Patient Account Number: [id number] Date of Birth/Sex: Treating RN: 1956/02/13 (65 y.o. [name]) [name] Primary Care Provider: [name] Other Clinician: Referring Provider: Treating Provider/Extender: RO BSO Delane Ginger, MICHA EL Preston Fleeting in Treatment: 23 Diagnosis Coding ICD-10 Codes Code Description [phone] Pressure ulcer of right heel, stage 3 L89.623 Pressure ulcer of left heel, stage 3 L89.154 Pressure ulcer of sacral region, stage 4 L24.A0 Irritant contact dermatitis due to friction or contact with body fluids, unspecified L98.412 Non-pressure chronic ulcer of buttock  with fat layer exposed M62.81 Muscle weakness (generalized) F31.9 Bipolar  disorder, unspecified E11.622 Type 2 diabetes mellitus with other skin ulcer I10 Essential (primary) hypertension Z79.01 Long term (current) use of anticoagulants 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 Facility Procedures : CPT4 Code: [id number] Description: 99213 - WOUND CARE VISIT-LEV 3 EST PT Modifier: Quantity: 1 Physician Procedures : CPT4 Code Description Modifier [id number] 99213 - WC PHYS LEVEL 3 - EST PT ICD-10 Diagnosis Description L89.613 Pressure ulcer of right heel, stage 3 L89.154 Pressure ulcer of sacral region, stage 4 E11.622 Type 2 diabetes mellitus with other skin ulcer Quantity: 1 Electronic Signature(s) Signed: 05/07/2022 4:53:18 PM By: [name] RN Signed: 05/07/2022 5:01:28 PM By: Linton Ham MD Entered By: [name] on 05/07/2022 08:43:27

## 2022-05-08 NOTE — Progress Notes (Signed)
DONIE, LEMELIN ([id number]) [id number] Page 1 of 10 Visit Report for 05/07/2022 Arrival Information Details Patient Name: Date of Service: [name] 05/07/2022 8:00 A M Medical Record Number: [id number] Patient Account Number: [id number] Date of Birth/Sex: Treating RN: 1955-11-16 (65 y.o. [name]) [name] Primary Care Louise Victory: [name] Other Clinician: Referring Nikole Swartzentruber: Treating Mella Inclan/Extender: RO BSO Dorris Carnes, MICHA EL Della Goo in Treatment: 23 Visit Information History Since Last Visit All ordered tests and consults were completed: No Patient Arrived: Wheel Chair Added or deleted any medications: No Arrival Time: 08:04 Any new allergies or adverse reactions: No Accompanied By: wife Had a fall or experienced change in No Transfer Assistance: None activities of daily living that may affect Patient Identification Verified: Yes risk of falls: Secondary Verification Process Completed: Yes Signs or symptoms of abuse/neglect since last visito No Patient Requires Transmission-Based Precautions: No Hospitalized since last visit: No Patient Has Alerts: Yes Implantable device outside of the clinic excluding No Patient Alerts: Patient on Blood Thinner cellular tissue based products placed in the [name] since last visit: Has Dressing in Place as Prescribed: Yes Pain Present Now: No Electronic Signature(s) Signed: 05/07/2022 4:53:18 PM By: [name] RN Entered By: [name] on 05/07/2022 08:08:08 -------------------------------------------------------------------------------- Clinic Level of Care Assessment Details Patient Name: Date of Service: [name] [name] 05/07/2022 8:00 A M Medical Record Number: [id number] Patient Account Number: [id number] Date of Birth/Sex: Treating RN: 1956-01-20 (65 y.o. [name]) [name] Primary Care Layci Stenglein: [name] Other Clinician: Referring Lance Huaracha: Treating  Dilyn Osoria/Extender: RO BSO N, MICHA EL Della Goo in Treatment: 23 Clinic Level of Care Assessment Items TOOL 4 Quantity Score X- 1 0 Use when only an EandM is performed on FOLLOW-UP visit ASSESSMENTS - Nursing Assessment / Reassessment X- 1 10 Reassessment of Co-morbidities (includes updates in patient status) X- 1 5 Reassessment of Adherence to Treatment Plan [name], [name] ([id number]) [id number] Page 2 of 10 ASSESSMENTS - Wound and Skin A ssessment / Reassessment []  - 0 Simple Wound Assessment / Reassessment - one wound X- 2 5 Complex Wound Assessment / Reassessment - multiple wounds []  - 0 Dermatologic / Skin Assessment (not related to wound area) ASSESSMENTS - Focused Assessment []  - 0 Circumferential Edema Measurements - multi extremities []  - 0 Nutritional Assessment / Counseling / Intervention []  - 0 Lower Extremity Assessment (monofilament, tuning fork, pulses) []  - 0 Peripheral Arterial Disease Assessment (using hand held doppler) ASSESSMENTS - Ostomy and/or Continence Assessment and Care []  - 0 Incontinence Assessment and Management []  - 0 Ostomy Care Assessment and Management (repouching, etc.) PROCESS - Coordination of Care X - Simple Patient / Family Education for ongoing care 1 15 []  - 0 Complex (extensive) Patient / Family Education for ongoing care []  - 0 Staff obtains , Records, T Results / Process Orders est []  - 0 Staff telephones HHA, Nursing Homes / Clarify orders / etc []  - 0 Routine Transfer to another Facility (non-emergent condition) []  - 0 Routine Hospital Admission (non-emergent condition) []  - 0 New Admissions / / Ordering NPWT Apligraf, etc. , []  - 0 Emergency Hospital Admission (emergent condition) X- 1 10 Simple Discharge Coordination []  - 0 Complex (extensive) Discharge Coordination PROCESS - Special Needs []  - 0 Pediatric / Minor Patient  Management []  - 0 Isolation Patient Management []  - 0 Hearing / Language / Visual special needs []  - 0 Assessment of Community assistance (transportation, D/C planning, etc.) []  - 0 Additional assistance / Altered mentation []  -  0 Support Surface(s) Assessment (bed, cushion, seat, etc.) INTERVENTIONS - Wound Cleansing / Measurement []  - 0 Simple Wound Cleansing - one wound X- 2 5 Complex Wound Cleansing - multiple wounds X- 1 5 Wound Imaging (photographs - any number of wounds) []  - 0 Wound Tracing (instead of photographs) []  - 0 Simple Wound Measurement - one wound X- 2 5 Complex Wound Measurement - multiple wounds INTERVENTIONS - Wound Dressings X - Small Wound Dressing one or multiple wounds 2 10 []  - 0 Medium Wound Dressing one or multiple wounds []  - 0 Large Wound Dressing one or multiple wounds []  - 0 Application of Medications - topical []  - 0 Application of Medications - injection INTERVENTIONS - Miscellaneous []  - 0 External ear exam [name], [name] ( ) [id number] Page 3 of 10 []  - 0 Specimen Collection (cultures, biopsies, blood, body fluids, etc.) []  - 0 Specimen(s) / Culture(s) sent or taken to Lab for analysis []  - 0 Patient Transfer (multiple staff / Lift / Similar devices) []  - 0 Simple Staple / Suture removal (25 or less) []  - 0 Complex Staple / Suture removal (26 or more) []  - 0 Hypo / Hyperglycemic Management (close monitor of Blood Glucose) []  - 0 Ankle / Brachial Index (ABI) - do not check if billed separately X- 1 5 Vital Signs Has the patient been seen at the hospital within the last three years: Yes Total Score: 100 Level Of Care: New/Established - Level 3 Electronic Signature(s) Signed: 05/07/2022 4:53:18 PM By: RN Entered By: on 05/07/2022 08:43:17 -------------------------------------------------------------------------------- Encounter Discharge Information  Details Patient Name: Date of Service: [id number] [facility] 05/07/2022 8:00 A M Medical Record Number: Patient Account Number: Date of Birth/Sex: Treating RN: June 15, 1956 (65 y.o. Primary Care Oakleigh Hesketh: Other Clinician: Referring Leonia Heatherly: Treating Gurtha Picker/Extender: RO BSO N, MICHA EL in Treatment: 23 Encounter Discharge Information Items Discharge Condition: Stable Ambulatory Status: Wheelchair Discharge Destination: Home Transportation: Private Auto Accompanied By: self Schedule Follow-up Appointment: Yes Clinical Summary of Care: Electronic Signature(s) Signed: 05/07/2022 4:53:18 PM By: [name] RN Entered By: 13/12/2021 on 05/07/2022 08:48:18 Lower Extremity Assessment Details -------------------------------------------------------------------------------- Margart Sickles ([name]) [id number] Page 4 of 10 Patient Name: Date of Service: [id number] [facility] 05/07/2022 8:00 A M Medical Record Number: [id number] Patient Account Number: [name] Date of Birth/Sex: Treating RN: September 16, 1955 (65 y.o. 24) 13/12/2021 Primary Care Jamil Armwood: [name] Other Clinician: Referring Laycee Fitzsimmons: Treating Maryrose Colvin/Extender: RO BSO [name], MICHA EL 13/12/2021 in Treatment: 23 Vascular Assessment Left: Right: Pulses: Dorsalis Pedis Palpable: Yes Electronic Signature(s) Signed: 05/07/2022 4:53:18 PM By: [birth year]1975 RN Entered By: [name] on 05/07/2022 08:19:54 -------------------------------------------------------------------------------- Multi Wound Chart Details Patient Name: Date of Service: 13/12/2021 [name] 05/07/2022 8:00 A M Medical Record Number: 06/19/1956 Patient Account Number: [id number] Date of Birth/Sex: Treating RN: 03/19/1956 (65 y.o. [name]) Dorris Carnes Primary Care Leopoldo Mazzie: Della Goo Other Clinician: Referring  Dennise Raabe: Treating Nasreen Goedecke/Extender: RO BSO 24, MICHA EL 13/12/2021 in Treatment: 23 Vital Signs Height(in): 66 Pulse(bpm): 83 Weight(lbs): 279 Blood Pressure(mmHg): 194/97 Body Mass Index(BMI): 45 Temperature(F): 98 Respiratory Rate(breaths/min): 16 [5:Photos:] [N/A:N/A] Right Calcaneus Sacrum N/A Wound Location: Pressure Injury Pressure Injury N/A Wounding Event: Pressure Ulcer Pressure Ulcer N/A Primary Etiology: Anemia, Sleep Apnea, Coronary Artery Anemia, Sleep Apnea, Coronary Artery N/A Comorbid History: Disease, Deep Vein Thrombosis, Disease, Deep Vein Thrombosis, Hypertension, Type II Diabetes, Hypertension, Type II Diabetes,  History of pressure wounds, History of pressure wounds, Neuropathy Neuropathy 07/01/2021 07/01/2021 N/A Date Acquired: 23 23 N/A Weeks of Treatment: Open Open N/A Wound Status: No No N/A Wound Recurrence: 4x2x0.3 2x2x3.6 N/A Measurements L x W x D (cm) 6.283 3.142 N/A A (cm) : rea 1.885 11.31 N/A Volume (cm) : -51.80% 0.00% N/A % Reduction in Area: -355.30% -20.00% N/A % Reduction in Volume: Category/Stage III Category/Stage IV N/A Classification: [name], [name] ([id number]) [id number] Page 5 of 10 Medium Medium N/A Exudate Amount: Serosanguineous Serosanguineous N/A Exudate Type: red, brown red, brown N/A Exudate Color: Medium (34-66%) Large (67-100%) N/A Granulation Amount: Red, Pink Red N/A Granulation Quality: Medium (34-66%) None Present (0%) N/A Necrotic Amount: Fat Layer (Subcutaneous Tissue): Yes Fat Layer (Subcutaneous Tissue): Yes N/A Exposed Structures: Fascia: No Fascia: No Tendon: No Tendon: No Muscle: No Muscle: No Joint: No Joint: No Bone: No Bone: No None None N/A Epithelialization: Treatment Notes Electronic Signature(s) Signed: 05/07/2022 4:53:18 PM By: [name] RN Entered By: [name] on 05/07/2022  08:20:43 -------------------------------------------------------------------------------- Multi-Disciplinary Care Plan Details Patient Name: Date of Service: [name] [facility] 05/07/2022 8:00 A M Medical Record Number: [id number] Patient Account Number: [id number] Date of Birth/Sex: Treating RN: 10-Apr-1956 (65 y.o. [name]) [name] Primary Care Beverlee Wilmarth: [name] Other Clinician: Referring Ahron Hulbert: Treating Thos Matsumoto/Extender: RO BSO N, MICHA EL Della Goo in Treatment: 23 Active Inactive Wound/Skin Impairment Nursing Diagnoses: Knowledge deficit related to ulceration/compromised skin integrity Goals: Patient/caregiver will verbalize understanding of skin care regimen Date Initiated: 11/22/2021 Target Resolution Date: 05/25/2022 Goal Status: Active Ulcer/skin breakdown will have a volume reduction of 30% by week 4 Date Initiated: 11/22/2021 Date Inactivated: 01/10/2022 Target Resolution Date: 12/23/2021 Goal Status: Unmet Unmet Reason: comorbities Ulcer/skin breakdown will have a volume reduction of 50% by week 8 Date Initiated: 11/22/2021 Date Inactivated: 05/07/2022 Target Resolution Date: 01/22/2022 Goal Status: Unmet Unmet Reason: comorbidities Ulcer/skin breakdown will have a volume reduction of 80% by week 12 Date Initiated: 11/22/2021 Date Inactivated: 05/07/2022 Target Resolution Date: 02/22/2022 Goal Status: Unmet Unmet Reason: comorbidities Ulcer/skin breakdown will heal within 14 weeks Date Initiated: 11/22/2021 Date Inactivated: 05/07/2022 Target Resolution Date: 03/25/2022 Goal Status: Unmet Unmet Reason: comorbidities Interventions: Assess patient/caregiver ability to obtain necessary supplies Assess patient/caregiver ability to perform ulcer/skin care regimen upon admission and as needed Assess ulceration(s) every visit Notes: [name], [name] ([id number]) [id number] Page 6 of 10 Electronic  Signature(s) Signed: 05/07/2022 4:53:18 PM By: [name] RN Entered By: [name] on 05/07/2022 08:20:30 -------------------------------------------------------------------------------- Pain Assessment Details Patient Name: Date of Service: [name] [name] 05/07/2022 8:00 A M Medical Record Number: [id number] Patient Account Number: [id number] Date of Birth/Sex: Treating RN: May 24, 1956 (65 y.o. [name]) [name] Primary Care Alcee Sipos: [name] Other Clinician: Referring Otelia Hettinger: Treating Shareka Casale/Extender: RO BSO Dorris Carnes, MICHA EL Della Goo in Treatment: 23 Active Problems Location of Pain Severity and Description of Pain Patient Has Paino No Site Locations Pain Management and Medication Current Pain Management: Electronic Signature(s) Signed: 05/07/2022 4:53:18 PM By: [name] RN Entered By: [name] on 05/07/2022 08:10:55 -------------------------------------------------------------------------------- Patient/Caregiver Education Details Patient Name: Date of Service: [name] [facility] 11/7/2023andnbsp8:00 Signe Colt, [name] ([id number]) [id number] Page 7 of 10 Medical Record Number: [id number] Patient Account Number: [id number] Date of Birth/Gender: Treating RN: 1955-12-04 (65 y.o. [name]) [name] Primary Care Physician: [name] Other Clinician: Referring Physician: Treating Physician/Extender: RO BSO Dorris Carnes, MICHA EL Della Goo in Treatment: 23 Education Assessment Education Provided To: Patient Education  Topics Provided Wound/Skin Impairment: Methods: Explain/Verbal Responses: State content correctly Electronic Signature(s) Signed: 05/07/2022 4:53:18 PM By: [name] RN Entered By: [name] on 05/07/2022 08:43:43 -------------------------------------------------------------------------------- Wound Assessment Details Patient Name: Date of Service: [name] [name] 05/07/2022 8:00 A M Medical Record Number: [id number] Patient Account Number: [id number] Date of Birth/Sex: Treating RN: [birth year] (65 y.o. [name]) [name] Primary Care Locklan Canoy: [name] Other Clinician: Referring Trinda Harlacher: Treating Kienna Moncada/Extender: RO BSO N, MICHA EL Della Goo in Treatment: 23 Wound Status Wound Number: 5 Primary Pressure Ulcer Etiology: Wound Location: Right Calcaneus Wound Open Wounding Event: Pressure Injury Status: Date Acquired: 07/01/2021 Comorbid Anemia, Sleep Apnea, Coronary Artery Disease, Deep Vein Weeks Of Treatment: 23 History: Thrombosis, Hypertension, Type II Diabetes, History of pressure Clustered Wound: No wounds, Neuropathy Photos Wound Measurements Length: (cm) 4 Width: (cm) 2 Depth: (cm) 0.3 Area: (cm) 6.28 Volume: (cm) 1.88 Florea, Micajah ([id number]) % Reduction in Area: -51.8% % Reduction in Volume: -355.3% Epithelialization: None 3 Tunneling: No 5 Undermining: No [id number] Page 8 of 10 Wound Description Classification: Category/Stage III Exudate Amount: Medium Exudate Type: Serosanguineous Exudate Color: red, brown Foul Odor After Cleansing: No Slough/Fibrino Yes Wound Bed Granulation Amount: Medium (34-66%) Exposed Structure Granulation Quality: Red, Pink Fascia Exposed: No Necrotic Amount: Medium (34-66%) Fat Layer (Subcutaneous Tissue) Exposed: Yes Necrotic Quality: Adherent Slough Tendon Exposed: No Muscle Exposed: No Joint Exposed: No Bone Exposed: No Treatment Notes Wound #5 (Calcaneus) Wound Laterality: Right Cleanser Peri-Wound Care Topical Primary Dressing Hydrofera Blue Ready Transfer Foam, 2.5x2.5 (in/in) Discharge Instruction: Apply Hydrofera Blue Ready to wound bed as directed Secondary Dressing ABD Pad 5x9 (in/in) Discharge Instruction: Cover with ABD pad Secured With Medipore T - 40M Medipore H Soft Cloth Surgical T ape ape, 2x2  (in/yd) Conform 4'' - Conforming Stretch Gauze Bandage 4x75 (in/in) Discharge Instruction: Apply as directed Compression Wrap Compression Stockings Add-Ons Electronic Signature(s) Signed: 05/07/2022 4:53:18 PM By: [name] RN Entered By: [name] on 05/07/2022 08:19:22 -------------------------------------------------------------------------------- Wound Assessment Details Patient Name: Date of Service: [name] [facility] 05/07/2022 8:00 A M Medical Record Number: [id number] Patient Account Number: [id number] Date of Birth/Sex: Treating RN: 03-04-1956 (65 y.o. [name]) [name] Primary Care Aryelle Figg: [name] Other Clinician: Referring Kaliope Quinonez: Treating Antwione Picotte/Extender: RO BSO N, MICHA EL Della Goo in Treatment: 23 Wound Status Wound Number: 7 Primary Pressure Ulcer Etiology: Wound Location: Sacrum Wound Open Wounding Event: Pressure Injury Status: Date Acquired: 07/01/2021 [name] ([id number]) [id number] Page 9 of 10 Date Acquired: 07/01/2021 Comorbid Anemia, Sleep Apnea, Coronary Artery Disease, Deep Vein Weeks Of Treatment: 23 History: Thrombosis, Hypertension, Type II Diabetes, History of pressure Clustered Wound: No wounds, Neuropathy Photos Wound Measurements Length: (cm) 2 Width: (cm) 2 Depth: (cm) 3.6 Area: (cm) 3.142 Volume: (cm) 11.31 % Reduction in Area: 0% % Reduction in Volume: -20% Epithelialization: None Tunneling: No Undermining: No Wound Description Classification: Category/Stage IV Exudate Amount: Medium Exudate Type: Serosanguineous Exudate Color: red, brown Foul Odor After Cleansing: No Slough/Fibrino Yes Wound Bed Granulation Amount: Large (67-100%) Exposed Structure Granulation Quality: Red Fascia Exposed: No Necrotic Amount: None Present (0%) Fat Layer (Subcutaneous Tissue) Exposed: Yes Tendon Exposed: No Muscle Exposed: No Joint Exposed: No Bone Exposed:  No Treatment Notes Wound #7 (Sacrum) Cleanser Peri-Wound Care Topical calmoseptine Discharge Instruction: apply to excorated areas Primary Dressing Hydrofera Blue Ready Transfer Foam, 4x5 (in/in) Discharge Instruction: spiral hydrofera blue to lightly pack into wound, then cut to cover wound Secondary Dressing ABD Pad 5x9 (in/in)  Discharge Instruction: Cover with ABD pad Secured With Medipore T - 89M Medipore H Soft Cloth Surgical T ape ape, 2x2 (in/yd) Compression Wrap Compression Stockings Add-Ons Electronic Signature(s) Signed: 05/07/2022 4:53:18 PM By: Yevonne PaxEpps, Carrie RN Entered By: Yevonne PaxEpps, Carrie on 05/07/2022 08:19:42 Barkett, [name] LongsJOSEPH ([id number]) [id number] Page 10 of 10 -------------------------------------------------------------------------------- Vitals Details Patient Name: Date of Service: [name] DawleyDEGRA [name], [facility] 8:00 A M Medical Record Number: [id number] Patient Account Number: [id number] Date of Birth/Sex: Treating RN: 15-Oct-1955 (65 y.o. Judie PetitM) Yevonne PaxEpps, Carrie Primary Care Ala Capri: [name] BoozePartridge, James Other Clinician: Referring Caellum Mancil: Treating Alissandra Geoffroy/Extender: RO BSO N, MICHA EL Della GooG Partridge, James Weeks in Treatment: 23 Vital Signs Time Taken: 08:08 Temperature (F): 98 Height (in): 66 Pulse (bpm): 83 Weight (lbs): 279 Respiratory Rate (breaths/min): 16 Body Mass Index (BMI): 45 Blood Pressure (mmHg): 194/97 Reference Range: 80 - 120 mg / dl Electronic Signature(s) Signed: 05/07/2022 4:53:18 PM By: Yevonne PaxEpps, Carrie RN Entered By: Yevonne PaxEpps, Carrie on 05/07/2022 08:10:34

## 2022-05-28 ENCOUNTER — Encounter: Payer: Medicare Other | Admitting: Physician Assistant

## 2022-05-28 DIAGNOSIS — E11622 Type 2 diabetes mellitus with other skin ulcer: Secondary | ICD-10-CM | POA: Diagnosis not present

## 2022-05-28 NOTE — Progress Notes (Addendum)
LEANDREW, KEECH ([id number]) [id number] Page 1 of 10 Visit Report for 05/28/2022 Chief Complaint Document Details Patient Name: Date of Service: [name] [facility] 05/28/2022 8:00 A M Medical Record Number: [id number] Patient Account Number: [id number] Date of Birth/Sex: Treating RN: [birth year] (65 y.o. [name]) [name] Primary Care Provider: [name] Other Clinician: Referring Provider: Treating Provider/Extender: [name]: 26 Information Obtained from: Patient Chief Complaint Sacral, right gluteal, and bilateral heel ulcers Electronic Signature(s) Signed: 05/28/2022 8:17:10 AM By: [name] PA-C Entered By: [name] on 05/28/2022 08:17:10 -------------------------------------------------------------------------------- Debridement Details Patient Name: Date of Service: [name] [facility] 05/28/2022 8:00 A M Medical Record Number: [id number] Patient Account Number: [id number] Date of Birth/Sex: Treating RN: [birth year] (65 y.o. [name]) [name] Primary Care Provider: [name] Other Clinician: Referring Provider: Treating Provider/Extender: [name]: 26 Debridement Performed for Assessment: Wound #5 Right Calcaneus Performed By: Physician Tommie Sams., PA-C Debridement Type: Debridement Level of Consciousness (Pre-procedure): Awake and Alert Pre-procedure Verification/Time Out Yes - 08:43 Taken: Start Time: 08:43 T Area Debrided (L x W): otal 2.7 (cm) x 2 (cm) = 5.4 (cm) Tissue and other material debrided: Viable, Non-Viable, Slough, Subcutaneous, Skin: Dermis , Skin: Epidermis, Slough Level: Skin/Subcutaneous Tissue Debridement Description: Excisional Instrument: Curette Bleeding: Moderate Hemostasis Achieved: Pressure End Time: 08:46 Procedural Pain: 0 Post Procedural Pain: 0 Response to Treatment: Procedure was  tolerated well Level of Consciousness Olene FlossBROLY, HATFIELD ([id number]) [id number] Page 2 of 10 Level of Consciousness (Post- Awake and Alert procedure): Post Debridement Measurements of Total Wound Length: (cm) 2.7 Stage: Category/Stage III Width: (cm) 2 Depth: (cm) 0.2 Volume: (cm) 0.848 Character of Wound/Ulcer Post Debridement: Improved Post Procedure Diagnosis Same as Pre-procedure Electronic Signature(s) Signed: 05/28/2022 4:00:23 PM By: [name] RN Signed: 05/28/2022 4:53:03 PM By: [name] PA-C Entered By: [name] on 05/28/2022 08:45:08 -------------------------------------------------------------------------------- HPI Details Patient Name: Date of Service: [name] [facility] 05/28/2022 8:00 [place] Record Number: [id number] Patient Account Number: [id number] Date of Birth/Sex: Treating RN: 02/19/1956 (65 y.o. Oval Linsey Primary Care Provider: [name] Other Clinician: Referring Provider: Treating Provider/Extender: [name]: 26 History of Present Illness HPI Description: 05/29/2021 this is a patient who presents today for initial inspection here in the clinic concerning wounds that he has over the right heel and the sacral region. Unfortunately the sacral wound is starting to spread off to the right gluteal location due to how he sits always leaning towards the right side in his chair. His wife is present she is the primary caregiver though she is not home with him all the time she does have to work. She does do an excellent job however it appears to be in trying to keep things under good control for him. The patient is not able to change positions himself nor walk by himself so he is pretty much at the mercy of the position he is putting when she is gone and this tends to be his chair which she sits and most of the day. Obviously this I think is the main  culprit for what is going on currently. It was actually in January 2020 when the sacral wound started. It was in September 2022 when the wound started to spread more to the right gluteal location. Subsequently in August 2022 is when he had been in a skilled nursing facility and the heel started to give him trouble  as well. That does not seem to be doing nearly as poorly as the sacral region. He was hospitalized in October 2022 secondary to sepsis and this was in regard to the foot and was sent to skilled nursing again he is now back at home. He did have a wound VAC for the sacral wound over the summer 2022 but being in and out of facilities this ended up getting sent back. The patient does have [facility] home health that comes out 1 time per week to help with care. His most recent hemoglobin A1c was 6.9 in August 2022. 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 s. 07/24/2021 upon evaluation today patient appears to be doing decently well in regard to the pressure ulcer on the right heel as well as the sacral region. In general I think you are making some progress here which is great news. Overall the heel unfortunately had already closed previously when we saw him although it apparently reopened when he was working with physical therapy according to his wife. The area in the sacral region is doing well and looks clean there is still some depth here but I still think it would be difficult to wound VAC this region. His wife does an awesome job taking care of him. Is been so long since we have seen him because he has been in the hospital to be honest. 08/07/2021 upon evaluation today patient appears to be doing well at this time. Fortunately I do not see any signs of active infection locally or  systemically at this time which is great news. No fevers, chills, nausea, vomiting, or diarrhea. Unfortunately after I saw him on the 24th he actually ended up in the hospital in the 27th due to being septic. This was not due to the wounds but after looking at records actually due to a UTI. Fortunately he is doing much better and very happy in that regard. I do not see any signs of infection locally nor systemically at this time. 08/21/2021 upon evaluation today patient appears to be doing well with regard to his wound. Fortunately I think that the sacral region is doing decently well at this point which is great news. With regard to the foot this also does have some slough noted but I feel like we are making progress here. He does have some irritation around the right upper thigh/gluteal region. I feel like it is more towards the thigh. Nonetheless this does appear to be pressure related he spends a lot of time sitting up his caregiver states he really will not get in the bed and stay there like he should. 09/04/2021 upon evaluation patient appears to be doing decently well today in regard to the wound on his heel as well as the sacral area. I am actually very pleased with both and how things are appearing currently. There does not appear to be any evidence of active infection I think that his caregiver is doing an awesome job with regard to the wound care here. She is present today as well and I did discuss this with her. HARSHIL, CAVALLARO ([id number]) [id number] Page 3 of 10 09/25/2021 upon evaluation today patient appears to be doing well with regard to his wound on the sacral region. His right heel is also doing well. Unfortunately he has a new area on his left  heel which does appear to be pressure injury. I do not see any evidence of active infection locally or systemically which is great news no fevers, chills, nausea, vomiting, or diarrhea. Readmission: 11-22-2021  upon evaluation today patient presents for reevaluation here in the clinic concerning issues with his wounds. Since have last seen him he was in the hospital and then subsequently ended up in a skilled nursing facility. Subsequently period of time in the facility he actually had a breakdown in the right thigh location which has been an issue for him. Fortunately I do not see any evidence of active infection locally or systemically which is great news and I am very pleased in that regard. Nonetheless he does have still the wounds on both heels as well as the wound in the sacral area and the new area in the right upper thigh/gluteal region. 12-13-2021 upon evaluation today patient appears to be doing well currently in regard to his wounds. The left heel is going to require some sharp debridement. Fortunately the right heel seems to be doing quite well. Overall I think his gluteal region as well as sacral region also are doing well. 01-10-2022 upon evaluation today patient appears to be doing better in regard to some areas unfortunately his sacral area is not doing as well. It seems of gotten worse his caregiver states when he was in the hospital recently they really did not offloading. Fortunately I do not see any evidence of active infection locally or systemically at this time. 02-01-2022 upon evaluation today patient appears to be doing well currently in regard to his wounds. He is going require some sharp debridement of clearway some of the necrotic debris. Fortunately I do not see any evidence of active infection locally or systemically which is great news. No fevers, chills, nausea, vomiting, or diarrhea. 02-22-2022 upon evaluation today patient appears to be doing poorly in regard to his right heel the left heel is completely closed. Unfortunately I think he is got some deep tissue injury to this area I am going to do a culture at this spot. With that being said he is having 2 other significant issues  in regard to the left thigh this is showing signs of cellulitis all the way down into the thigh and was seeing definite temperature difference in the left thigh versus the right thigh when feeling. It also is of note that the patient is also having some issues here with trouble in regard to having fevers his wife has been giving him Tylenol and ibuprofen to help keep the fevers under control but he has been as high as 102 this is pretty much been going on since Monday or Tuesday of this week. Nonetheless I am concerned I am not exactly sure where the cellulitis on the left thigh is coming from not sure if this is emanating from his sacral region that is a possibility or if there is something completely different going on either way with the fevers I am more worried that he needs to go get this checked out as soon as possible. 03-08-2022 upon evaluation today patient appears to be doing well with regard to his heel as well as sacral region. Both are showing signs of significant improvement compared to the last time I saw him. Overall I think we are in a much better spot 04-04-2022 upon evaluation today patient appears to be doing about the same in regard to his wounds. I do not see any signs of improvement nor worsening  at this point. Fortunately there is no evidence of active infection at this time. He is not staying off of this is much as he needs to in fact is not even sleeping in the hospital bed with the alternating air mattress. 04-16-2022 upon evaluation today patient appears to be doing well currently in regard to his wound. He has been tolerating the dressing changes without complication. Fortunately I do not see any evidence of infection. The heel actually looks quite well the sacral area actually is still quite deep. From my questioning and discussion with him today I do not believe that he is really staying off of this as much as he should. He seemed a little uncomfortable during that portion  of the conversation today. I think that is contributing quite a bit to his lack of progress with regard to healing. 11/7; patient with decubitus ulcers on his right heel as well as his lower sacrum. We have been using Hydrofera Blue. He has home health predominantly for Foley catheter management. There have been some issues getting the Hydrofera Blue through home health but apparently the patient's wife has some supplies and is managing to get these changed.They come every 3 weeks because of transportation difficulties 05-28-2022 upon evaluation today patient appears to be doing decently well currently in regard to his wounds. He still has a depth to the wound in the sacral region but again this does seem to be a little bit less than previous. He has been using Hydrofera Blue both on this in the heel ulcer. Both are going to require some cleaning today sharp debridement on the heel I think is cleaning with saline gauze and the sterile Q-tip is probably sufficient for the sacral area which does not appear to have too much necrotic tissue. Electronic Signature(s) Signed: 05/28/2022 2:52:21 PM By: [name] PA-C Entered By: [name] on 05/28/2022 14:52:21 -------------------------------------------------------------------------------- Physical Exam Details Patient Name: Date of Service: [name] [facility] 05/28/2022 8:00 A M Medical Record Number: [id number] Patient Account Number: [id number] Date of Birth/Sex: Treating RN: 11/01/1955 (65 y.o. [name]) [name] Primary Care Provider: [name] Other Clinician: Referring Provider: Treating Provider/Extender: [name]: 59 Constitutional Well-nourished and well-hydrated in no acute distress. Respiratory LUVIANO, [name] ([id number]) [id number] Page 4 of 10 normal breathing without difficulty. Psychiatric this patient is able to make decisions and  demonstrates good insight into disease process. Alert and Oriented x 3. pleasant and cooperative. Notes Upon inspection patient's wound bed actually again showed signs of not being quite as deep in regard to the sacral region which is good news. I am actually pleased in that regard. Fortunately I do not see any evidence of infection locally or systemically which is great news and overall I do believe we are headed in the right direction. Electronic Signature(s) Signed: 05/28/2022 2:52:41 PM By: [name] PA-C Entered By: [name] on 05/28/2022 14:52:41 -------------------------------------------------------------------------------- Physician Orders Details Patient Name: Date of Service: [name] [facility] 05/28/2022 8:00 A M Medical Record Number: [id number] Patient Account Number: [id number] Date of Birth/Sex: Treating RN: [birth year] (65 y.o. [name]) [name] Primary Care Provider: [name] Other Clinician: Referring Provider: Treating Provider/Extender: [name]: 26 Verbal / Phone Orders: No Diagnosis Coding ICD-10 Coding Code Description L89.613 Pressure ulcer of right heel, stage 3 L89.623 Pressure ulcer of left heel, stage 3 L89.154 Pressure ulcer of sacral region, stage 4 L24.A0 Irritant contact dermatitis due  to friction or contact with body fluids, unspecified L98.412 Non-pressure chronic ulcer of buttock with fat layer exposed M62.81 Muscle weakness (generalized) F31.9 Bipolar disorder, unspecified E11.622 Type 2 diabetes mellitus with other skin ulcer I10 Essential (primary) hypertension Z79.01 Long term (current) use of anticoagulants 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 [place] for wound care. May utilize formulary equivalent dressing for wound treatment orders unless otherwise specified. Home Health Nurse may  visit PRN to address patients wound care needs. Lajean Manes [phone] Scheduled days for dressing changes to be completed; exception, patient has scheduled wound care visit that day. **Please direct any NON-WOUND related issues/requests for orders to patient's Primary Care Physician. **If current dressing causes regression in wound condition, may D/C ordered dressing product/s and apply Normal Saline Moist Dressing daily until next [place] or Other MD appointment. **Notify Wound Healing Center of regression in wound condition at [phone]. Bathing/ Shower/ Hygiene No tub bath. Anesthetic (Use 'Patient Medications' Section for Anesthetic Order Entry) Lidocaine applied to wound bed Edema Control - Lymphedema / Segmental Compressive Device / Other Elevate, Exercise Daily and A void Standing for Long Periods of Time. Elevate legs to the level of the heart and pump ankles as often as possible Osso, [name] ([id number]) [id number] Page 5 of 10 Elevate leg(s) parallel to the floor when sitting. DO YOUR BEST to sleep in the bed at night. DO NOT sleep in your recliner. Long hours of sitting in a recliner leads to swelling of the legs and/or potential wounds on your backside. Off-Loading Gel wheelchair cushion Low air-loss mattress (Group 2) - Sleep in bed every night. Turn and reposition every 2 hours - keep pressure off of the sacrum and heels wounds Other: - PRAFO boot in bed keep pressure off of sacrum/gluteus and heels- Additional Orders / Instructions Follow Nutritious Diet and Increase Protein Intake Wound Treatment Wound #5 - Calcaneus Wound Laterality: Right Prim Dressing: Hydrofera Blue Ready Transfer Foam, 2.5x2.5 (in/in) 3 x Per Week/30 Days ary Discharge Instructions: Apply Hydrofera Blue Ready to wound bed as directed Secondary Dressing: (BORDER) Zetuvit Plus SILICONE BORDER Dressing 4x4 (in/in) 3 x Per Week/30 Days Discharge  Instructions: Please do not put silicone bordered dressings under wraps. Use non-bordered dressing only. Wound #7 - Sacrum Topical: calmoseptine 1 x Per Day/30 Days Discharge Instructions: apply to excorated areas Prim Dressing: Hydrofera Blue Ready Transfer Foam, 4x5 (in/in) 1 x Per Day/30 Days ary Discharge Instructions: spiral hydrofera blue to lightly pack into wound, then cut to cover wound Secondary Dressing: ABD Pad 5x9 (in/in) 1 x Per Day/30 Days Discharge Instructions: Cover with ABD pad Secured With: Medipore T - 28M Medipore H Soft Cloth Surgical T ape ape, 2x2 (in/yd) 1 x Per Day/30 Days Electronic Signature(s) Signed: 05/28/2022 4:00:23 PM By: [name] RN Signed: 05/28/2022 4:53:03 PM By: [name] PA-C Entered By: [name] on 05/28/2022 09:04:57 -------------------------------------------------------------------------------- Problem List Details Patient Name: Date of Service: [name] [facility] 05/28/2022 8:00 A M Medical Record Number: [id number] Patient Account Number: [id number] Date of Birth/Sex: Treating RN: 22-Nov-1955 (65 y.o. Oval Linsey Primary Care Provider: [name] Other Clinician: Referring Provider: Treating Provider/Extender: [name]: 26 Active Problems ICD-10 Encounter Code Description Active Date MDM Diagnosis L89.613 Pressure ulcer of right heel, stage 3 11/22/2021 No Yes L89.623 Pressure ulcer of left heel, stage 3 11/22/2021 No Yes [place], [name] ([id number]) [id number] Page 6 of  10 L89.154 Pressure ulcer of sacral region, stage 4 11/22/2021 No Yes L24.A0 Irritant contact dermatitis due to friction or contact with body fluids, 11/22/2021 No Yes unspecified L98.412 Non-pressure chronic ulcer of buttock with fat layer exposed 11/22/2021 No Yes M62.81 Muscle weakness (generalized) 11/22/2021 No Yes F31.9 Bipolar disorder, unspecified 11/22/2021 No  Yes E11.622 Type 2 diabetes mellitus with other skin ulcer 11/22/2021 No Yes I10 Essential (primary) hypertension 11/22/2021 No Yes Z79.01 Long term (current) use of anticoagulants 11/22/2021 No Yes 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 11/22/2021 No Yes Inactive Problems Resolved Problems Electronic Signature(s) Signed: 05/28/2022 8:17:04 AM By: [name] PA-C Entered By: [name] on 05/28/2022 08:17:04 -------------------------------------------------------------------------------- Progress Note Details Patient Name: Date of Service: [name] [facility] 05/28/2022 8:00 A M Medical Record Number: [id number] Patient Account Number: [id number] Date of Birth/Sex: Treating RN: [birth year] (65 y.o. Oval Linsey Primary Care Provider: [name] Other Clinician: Referring Provider: Treating Provider/Extender: [name]: 26 Subjective Chief Complaint Information obtained from Patient Sacral, right gluteal, and bilateral heel ulcers Solar, [name] ([id number]) [id number] Page 7 of 10 History of Present Illness (HPI) 05/29/2021 this is a patient who presents today for initial inspection here in the clinic concerning wounds that he has over the right heel and the sacral region. Unfortunately the sacral wound is starting to spread off to the right gluteal location due to how he sits always leaning towards the right side in his chair. His wife is present she is the primary caregiver though she is not home with him all the time she does have to work. She does do an excellent job however it appears to be in trying to keep things under good control for him. The patient is not able to change positions himself nor walk by himself so he is pretty much at the mercy of the position he is putting when she is gone and this tends to be his chair which she sits and most of the day. Obviously this I  think is the main culprit for what is going on currently. It was actually in January 2020 when the sacral wound started. It was in September 2022 when the wound started to spread more to the right gluteal location. Subsequently in August 2022 is when he had been in a skilled nursing facility and the heel started to give him trouble as well. That does not seem to be doing nearly as poorly as the sacral region. He was hospitalized in October 2022 secondary to sepsis and this was in regard to the foot and was sent to skilled nursing again he is now back at home. He did have a wound VAC for the sacral wound over the summer 2022 but being in and out of facilities this ended up getting sent back. The patient does have [facility] home health that comes out 1 time per week to help with care. His most recent hemoglobin A1c was 6.9 in August 2022. 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 s. 07/24/2021 upon evaluation today patient appears to be doing decently well in regard to the pressure ulcer on the right heel as well as the sacral region. In general I think you are making some progress here which is great news. Overall the heel unfortunately had already closed previously when we  saw him although it apparently reopened when he was working with physical therapy according to his wife. The area in the sacral region is doing well and looks clean there is still some depth here but I still think it would be difficult to wound VAC this region. His wife does an awesome job taking care of him. Is been so long since we have seen him because he has been in the hospital to be honest. 08/07/2021 upon evaluation today patient appears to be doing well at this time. Fortunately I do not see any signs of active infection  locally or systemically at this time which is great news. No fevers, chills, nausea, vomiting, or diarrhea. Unfortunately after I saw him on the 24th he actually ended up in the hospital in the 27th due to being septic. This was not due to the wounds but after looking at records actually due to a UTI. Fortunately he is doing much better and very happy in that regard. I do not see any signs of infection locally nor systemically at this time. 08/21/2021 upon evaluation today patient appears to be doing well with regard to his wound. Fortunately I think that the sacral region is doing decently well at this point which is great news. With regard to the foot this also does have some slough noted but I feel like we are making progress here. He does have some irritation around the right upper thigh/gluteal region. I feel like it is more towards the thigh. Nonetheless this does appear to be pressure related he spends a lot of time sitting up his caregiver states he really will not get in the bed and stay there like he should. 09/04/2021 upon evaluation patient appears to be doing decently well today in regard to the wound on his heel as well as the sacral area. I am actually very pleased with both and how things are appearing currently. There does not appear to be any evidence of active infection I think that his caregiver is doing an awesome job with regard to the wound care here. She is present today as well and I did discuss this with her. 09/25/2021 upon evaluation today patient appears to be doing well with regard to his wound on the sacral region. His right heel is also doing well. Unfortunately he has a new area on his left heel which does appear to be pressure injury. I do not see any evidence of active infection locally or systemically which is great news no fevers, chills, nausea, vomiting, or diarrhea. Readmission: 11-22-2021 upon evaluation today patient presents for reevaluation here in the clinic  concerning issues with his wounds. Since have last seen him he was in the hospital and then subsequently ended up in a skilled nursing facility. Subsequently period of time in the facility he actually had a breakdown in the right thigh location which has been an issue for him. Fortunately I do not see any evidence of active infection locally or systemically which is great news and I am very pleased in that regard. Nonetheless he does have still the wounds on both heels as well as the wound in the sacral area and the new area in the right upper thigh/gluteal region. 12-13-2021 upon evaluation today patient appears to be doing well currently in regard to his wounds. The left heel is going to require some sharp debridement. Fortunately the right heel seems to be doing quite well. Overall I think his gluteal region as well as sacral region also  are doing well. 01-10-2022 upon evaluation today patient appears to be doing better in regard to some areas unfortunately his sacral area is not doing as well. It seems of gotten worse his caregiver states when he was in the hospital recently they really did not offloading. Fortunately I do not see any evidence of active infection locally or systemically at this time. 02-01-2022 upon evaluation today patient appears to be doing well currently in regard to his wounds. He is going require some sharp debridement of clearway some of the necrotic debris. Fortunately I do not see any evidence of active infection locally or systemically which is great news. No fevers, chills, nausea, vomiting, or diarrhea. 02-22-2022 upon evaluation today patient appears to be doing poorly in regard to his right heel the left heel is completely closed. Unfortunately I think he is got some deep tissue injury to this area I am going to do a culture at this spot. With that being said he is having 2 other significant issues in regard to the left thigh this is showing signs of cellulitis all the  way down into the thigh and was seeing definite temperature difference in the left thigh versus the right thigh when feeling. It also is of note that the patient is also having some issues here with trouble in regard to having fevers his wife has been giving him Tylenol and ibuprofen to help keep the fevers under control but he has been as high as 102 this is pretty much been going on since Monday or Tuesday of this week. Nonetheless I am concerned I am not exactly sure where the cellulitis on the left thigh is coming from not sure if this is emanating from his sacral region that is a possibility or if there is something completely different going on either way with the fevers I am more worried that he needs to go get this checked out as soon as possible. 03-08-2022 upon evaluation today patient appears to be doing well with regard to his heel as well as sacral region. Both are showing signs of significant improvement compared to the last time I saw him. Overall I think we are in a much better spot 04-04-2022 upon evaluation today patient appears to be doing about the same in regard to his wounds. I do not see any signs of improvement nor worsening at this point. Fortunately there is no evidence of active infection at this time. He is not staying off of this is much as he needs to in fact is not even sleeping in the hospital bed with the alternating air mattress. 04-16-2022 upon evaluation today patient appears to be doing well currently in regard to his wound. He has been tolerating the dressing changes without complication. Fortunately I do not see any evidence of infection. The heel actually looks quite well the sacral area actually is still quite deep. From my questioning and discussion with him today I do not believe that he is really staying off of this as much as he should. He seemed a little uncomfortable during that portion of the conversation today. I think that is contributing quite a bit to  his lack of progress with regard to healing. 11/7; patient with decubitus ulcers on his right heel as well as his lower sacrum. We have been using Hydrofera Blue. He has home health predominantly for Foley catheter management. There have been some issues getting the Hydrofera Blue through home health but apparently the patient's wife has some supplies and  is managing to get these changed.They come every 3 weeks because of transportation difficulties 05-28-2022 upon evaluation today patient appears to be doing decently well currently in regard to his wounds. He still has a depth to the wound in the sacral region but again this does seem to be a little bit less than previous. He has been using Hydrofera Blue both on this in the heel ulcer. Both are going to require some cleaning today sharp debridement on the heel I think is cleaning with saline gauze and the sterile Q-tip is probably sufficient for the sacral area which does not appear to have too much necrotic tissue. CHRYSTOPHER, STANGL ([id number]) [id number] Page 8 of 10 Objective Constitutional Well-nourished and well-hydrated in no acute distress. Vitals Time Taken: 8:06 AM, Height: 66 in, Weight: 279 lbs, BMI: 45, Temperature: 97.7 F, Pulse: 90 bpm, Respiratory Rate: 20 breaths/min, Blood Pressure: 149/96 mmHg. Respiratory normal breathing without difficulty. Psychiatric this patient is able to make decisions and demonstrates good insight into disease process. Alert and Oriented x 3. pleasant and cooperative. General Notes: Upon inspection patient's wound bed actually again showed signs of not being quite as deep in regard to the sacral region which is good news. I am actually pleased in that regard. Fortunately I do not see any evidence of infection locally or systemically which is great news and overall I do believe we are headed in the right direction. Integumentary (Hair, Skin) Wound #5 status is Open.  Original cause of wound was Pressure Injury. The date acquired was: 07/01/2021. The wound has been in treatment 26 weeks. The wound is located on the Right Calcaneus. The wound measures 2.7cm length x 2cm width x 0.2cm depth; 4.241cm^2 area and 0.848cm^3 volume. There is Fat Layer (Subcutaneous Tissue) exposed. There is no tunneling or undermining noted. There is a medium amount of serosanguineous drainage noted. There is medium (34-66%) red, pink granulation within the wound bed. There is a medium (34-66%) amount of necrotic tissue within the wound bed including Adherent Slough. Wound #7 status is Open. Original cause of wound was Pressure Injury. The date acquired was: 07/01/2021. The wound has been in treatment 26 weeks. The wound is located on the Sacrum. The wound measures 2cm length x 2cm width x 3.2cm depth; 3.142cm^2 area and 10.053cm^3 volume. There is Fat Layer (Subcutaneous Tissue) exposed. There is no tunneling or undermining noted. There is a medium amount of serosanguineous drainage noted. There is large (67- 100%) red granulation within the wound bed. There is no necrotic tissue within the wound bed. Assessment Active Problems ICD-10 Pressure ulcer of right heel, stage 3 Pressure ulcer of left heel, stage 3 Pressure ulcer of sacral region, stage 4 Irritant contact dermatitis due to friction or contact with body fluids, unspecified Non-pressure chronic ulcer of buttock with fat layer exposed Muscle weakness (generalized) Bipolar disorder, unspecified Type 2 diabetes mellitus with other skin ulcer Essential (primary) hypertension Long term (current) use of anticoagulants Personal history of traumatic brain injury Procedures Wound #5 Pre-procedure diagnosis of Wound #5 is a Pressure Ulcer located on the Right Calcaneus . There was a Excisional Skin/Subcutaneous Tissue Debridement with a total area of 5.4 sq cm performed by Tommie Sams., PA-C. With the following instrument(s):  Curette to remove Viable and Non-Viable tissue/material. Material removed includes Subcutaneous Tissue, Slough, Skin: Dermis, and Skin: Epidermis. No specimens were taken. A time out was conducted at 08:43, prior to the start of the procedure. A Moderate amount of bleeding was controlled  with Pressure. The procedure was tolerated well with a pain level of 0 throughout and a pain level of 0 following the procedure. Post Debridement Measurements: 2.7cm length x 2cm width x 0.2cm depth; 0.848cm^3 volume. Post debridement Stage noted as Category/Stage III. Character of Wound/Ulcer Post Debridement is improved. Post procedure Diagnosis Wound #5: Same as Pre-Procedure Plan Follow-up Appointments: Return Appointment in 3 weeks. MARLEE, TRENTMAN ([id number]) [id number] Page 9 of 10 Home Health: [facility] for wound care. May utilize formulary equivalent dressing for wound treatment orders unless otherwise specified. Home Health Nurse may visit PRN to address patientoos wound care needs. Lajean Manes [phone] Scheduled days for dressing changes to be completed; exception, patient has scheduled wound care visit that day. **Please direct any NON-WOUND related issues/requests for orders to patient's Primary Care Physician. **If current dressing causes regression in wound condition, may D/C ordered dressing product/s and apply Normal Saline Moist Dressing daily until next [place] or Other MD appointment. **Notify [facility] of regression in wound condition at [phone]. Bathing/ Shower/ Hygiene: No tub bath. Anesthetic (Use 'Patient Medications' Section for Anesthetic Order Entry): Lidocaine applied to wound bed Edema Control - Lymphedema / Segmental Compressive Device / Other: Elevate, Exercise Daily and Avoid Standing for Long Periods of Time. Elevate legs to the level of the heart and pump ankles as often as possible Elevate  leg(s) parallel to the floor when sitting. DO YOUR BEST to sleep in the bed at night. DO NOT sleep in your recliner. Long hours of sitting in a recliner leads to swelling of the legs and/or potential wounds on your backside. Off-Loading: Gel wheelchair cushion Low air-loss mattress (Group 2) - Sleep in bed every night. Turn and reposition every 2 hours - keep pressure off of the sacrum and heels wounds Other: - PRAFO boot in bed keep pressure off of sacrum/gluteus and heels- Additional Orders / Instructions: Follow Nutritious Diet and Increase Protein Intake WOUND #5: - Calcaneus Wound Laterality: Right Prim Dressing: Hydrofera Blue Ready Transfer Foam, 2.5x2.5 (in/in) 3 x Per Week/30 Days ary Discharge Instructions: Apply Hydrofera Blue Ready to wound bed as directed Secondary Dressing: (BORDER) Zetuvit Plus SILICONE BORDER Dressing 4x4 (in/in) 3 x Per Week/30 Days Discharge Instructions: Please do not put silicone bordered dressings under wraps. Use non-bordered dressing only. WOUND #7: - Sacrum Wound Laterality: Topical: calmoseptine 1 x Per Day/30 Days Discharge Instructions: apply to excorated areas Prim Dressing: Hydrofera Blue Ready Transfer Foam, 4x5 (in/in) 1 x Per Day/30 Days ary Discharge Instructions: spiral hydrofera blue to lightly pack into wound, then cut to cover wound Secondary Dressing: ABD Pad 5x9 (in/in) 1 x Per Day/30 Days Discharge Instructions: Cover with ABD pad Secured With: Medipore T - 50M Medipore H Soft Cloth Surgical T ape ape, 2x2 (in/yd) 1 x Per Day/30 Days 1. I am going to suggest that we have the patient continue with the [place] which I do believe has been beneficial up to this point. 2. I am also can recommend that the patient should continue to monitor for any evidence of infection or worsening obviously if anything changes he should let me know. 3. They should also continue with appropriate offloading obviously I think this is can be crucial  to getting these wounds to heal. He is seen with his wife in the clinic today. We will see patient back for reevaluation in 3 weeks here in the clinic. If anything worsens or changes patient will contact our office for additional recommendations.  Electronic Signature(s) Signed: 05/28/2022 2:53:21 PM By: [name] PA-C Entered By: [name] on 05/28/2022 14:53:21 -------------------------------------------------------------------------------- SuperBill Details Patient Name: Date of Service: [name] 05/28/2022 Medical Record Number: [id number] Patient Account Number: [id number] Date of Birth/Sex: Treating RN: 28-Nov-1955 (65 y.o. [name]) [name] Primary Care Provider: [name] Other Clinician: Referring Provider: Treating Provider/Extender: [name]: 26 Diagnosis Coding ICD-10 Codes Code Description [phone] Pressure ulcer of right heel, stage 3 Rosenberry, [name] ([id number]) [id number] Page 10 of 10 [phone] Pressure ulcer of left heel, stage 3 L89.154 Pressure ulcer of sacral region, stage 4 L24.A0 Irritant contact dermatitis due to friction or contact with body fluids, unspecified L98.412 Non-pressure chronic ulcer of buttock with fat layer exposed M62.81 Muscle weakness (generalized) F31.9 Bipolar disorder, unspecified E11.622 Type 2 diabetes mellitus with other skin ulcer I10 Essential (primary) hypertension Z79.01 Long term (current) use of anticoagulants 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 Facility Procedures : CPT4 Code: [id number] Description: 11042 - DEB SUBQ TISSUE 20 SQ CM/< ICD-10 Diagnosis Description L89.613 Pressure ulcer of right heel, stage 3 Modifier: Quantity: 1 Physician Procedures : CPT4 Code Description Modifier [id number] 39030 - WC PHYS LEVEL 3 - EST PT 25 ICD-10 Diagnosis Description L89.613 Pressure ulcer of right heel, stage 3 L89.623  Pressure ulcer of left heel, stage 3 L89.154 Pressure ulcer of sacral region, stage 4 L24.A0  Irritant contact dermatitis due to friction or contact with body fluids, unspecified Quantity: 1 : [id number] 11042 - WC PHYS SUBQ TISS 20 SQ CM ICD-10 Diagnosis Description L89.613 Pressure ulcer of right heel, stage 3 Quantity: 1 Electronic Signature(s) Signed: 05/28/2022 2:53:49 PM By: [name] PA-C Entered By: [name] on 05/28/2022 14:53:48

## 2022-05-28 NOTE — Progress Notes (Signed)
[name], [name] ([id number]) [id number] Page 1 of 9 Visit Report for 05/28/2022 Arrival Information Details Patient Name: [name]00 A [name] Medical Record Number: [id number] Patient Account Number: [id number] Date of Birth/Sex: Treating RN: 08/10/1955 (66 y.o. [name] PetitM) [name] PaxEpps, [name] Primary Care Norton Bivins: Dione BoozePartridge, [name] Other Clinician: Referring [name]: Treating Cynia [name]/Extender: [name] in Treatment: 26 Visit Information History Since Last Visit All ordered tests and consults were completed: No Patient Arrived: Wheel Chair Added or deleted any medications: No Arrival Time: 08:02 Any new allergies or adverse reactions: No Accompanied By: wife Had a fall or experienced change in No Transfer Assistance: None activities of daily living that may affect Patient Identification Verified: Yes risk of falls: Secondary Verification Process Completed: Yes Signs or symptoms of abuse/neglect since last visito No Patient Requires Transmission-Based Precautions: No Hospitalized since last visit: No Patient Has Alerts: Yes Implantable device outside of the clinic excluding No Patient Alerts: Patient on Blood Thinner cellular tissue based products placed in the center since last visit: Has Dressing in Place as Prescribed: Yes Pain Present Now: No Electronic Signature(s) Signed: 05/28/2022 4:00:23 PM By: [name] PaxEpps, Carrie RN Entered By: [name] PaxEpps, [name] on 05/28/2022 08:06:04 -------------------------------------------------------------------------------- Clinic Level of Care Assessment Details Patient Name: [name] [name] Medical Record Number: [id number] Patient Account Number: [id number] Date of Birth/Sex: Treating RN: 08/10/1955 (66 y.o. [name] PetitM) [name] PaxEpps, [name] Primary Care Chikita Dogan: Dione BoozePartridge, [name] Other Clinician: Referring Linsey Hirota: Treating  Bryndle Corredor/Extender: [name] in Treatment: 26 Clinic Level of Care Assessment Items TOOL 1 Quantity Score []  - 0 Use when EandM and Procedure is performed on INITIAL visit ASSESSMENTS - Nursing Assessment / Reassessment []  - 0 General Physical Exam (combine w/ comprehensive assessment (listed just below) when performed on new pt. evals) []  - 0 Comprehensive Assessment (HX, ROS, Risk Assessments, Wounds Hx, etc.) Pickrel, [name] Page 2 of 9 ASSESSMENTS - Wound and Skin Assessment / Reassessment []  - 0 Dermatologic / Skin Assessment (not related to wound area) ASSESSMENTS - Ostomy and/or Continence Assessment and Care []  - 0 Incontinence Assessment and Management []  - 0 Ostomy Care Assessment and Management (repouching, etc.) PROCESS - Coordination of Care []  - 0 Simple Patient / Family Education for ongoing care []  - 0 Complex (extensive) Patient / Family Education for ongoing care []  - 0 Staff obtains ChiropractorConsents, Records, T Results / Process Orders est []  - 0 Staff telephones HHA, Nursing Homes / Clarify orders / etc []  - 0 Routine Transfer to another Facility (non-emergent condition) []  - 0 Routine Hospital Admission (non-emergent condition) []  - 0 New Admissions / Manufacturing engineernsurance Authorizations / Ordering NPWT Apligraf, etc. , []  - 0 Emergency Hospital Admission (emergent condition) PROCESS - Special Needs []  - 0 Pediatric / Minor Patient Management []  - 0 Isolation Patient Management []  - 0 Hearing / Language / Visual special needs []  - 0 Assessment of Community assistance (transportation, D/C planning, etc.) []  - 0 Additional assistance / Altered mentation []  - 0 Support Surface(s) Assessment (bed, cushion, seat, etc.) INTERVENTIONS - Miscellaneous []  - 0 External ear exam []  - 0 Patient Transfer (multiple staff / Nurse, adultHoyer Lift / Similar devices) []  - 0 Simple Staple / Suture removal  (25 or less) []  - 0 Complex Staple / Suture removal (26 or more) []  - 0 Hypo/Hyperglycemic Management (do not check if billed separately) []  - 0 Ankle / Brachial Index (ABI) - do not check  if billed separately Has the patient been seen at the hospital within the last three years: Yes Total Score: 0 Level Of Care: ____ Electronic Signature(s) Signed: 05/28/2022 4:00:23 PM By: [name] Pax RN Entered By: [name] on 05/28/2022 08:45:49 -------------------------------------------------------------------------------- Encounter Discharge Information Details Patient Name: Date of Service: [name] [facility] 05/28/2022 8:00 A [name] Medical Record Number: [id number] Patient Account Number: [id number] Date of Birth/Sex: Treating RN: 1956/01/16 (66 y.o. [name] Primary Care Kaedin Hicklin: Dione Booze Other Clinician: Referring Shayonna Ocampo: Treating Tishia Maestre/Extender: Pershing Proud [place], [name] [id number] Page 3 of 9 [name] in Treatment: 26 Encounter Discharge Information Items Post Procedure Vitals Discharge Condition: Stable Temperature (F): 97.7 Ambulatory Status: Wheelchair Pulse (bpm): 90 Discharge Destination: Home Respiratory Rate (breaths/min): 20 Transportation: Private Auto Blood Pressure (mmHg): 149/96 Accompanied By: wife Schedule Follow-up Appointment: Yes Clinical Summary of Care: Electronic Signature(s) Signed: 05/28/2022 4:00:23 PM By: [name] Pax RN Entered By: [name] on 05/28/2022 08:47:32 -------------------------------------------------------------------------------- Lower Extremity Assessment Details Patient Name: Date of Service: [name] [facility] 05/28/2022 8:00 A [name] Medical Record Number: [id number] Patient Account Number: [id number] Date of Birth/Sex: Treating RN: 06-May-1956 (65 y.o. [name] Petit) [name] Primary Care Vanda Waskey: Dione Booze Other Clinician: Referring  Caroline Longie: Treating Quanda Pavlicek/Extender: [name] Duff in Treatment: 26 Electronic Signature(s) Signed: 05/28/2022 4:00:23 PM By: [name] Pax RN Entered By: [name] on 05/28/2022 08:16:39 -------------------------------------------------------------------------------- Multi Wound Chart Details Patient Name: Date of Service: [name] [facility] 05/28/2022 8:00 A [name] Medical Record Number: [id number] Patient Account Number: [id number] Date of Birth/Sex: Treating RN: 11/17/1955 (65 y.o. [name] Primary Care Mccoy Testa: Dione Booze Other Clinician: Referring Treva Huyett: Treating Suzannah Bettes/Extender: [name] Duff in Treatment: 26 Vital Signs Height(in): 66 Pulse(bpm): 90 Weight(lbs): 279 Blood Pressure(mmHg): 149/96 Body Mass Index(BMI): 45 Temperature(F): 97.7 Respiratory Rate(breaths/min): 20 [Laster, Ralpheal ([id number]):Photos:] [id number] Page 4 of 9:5 7 N/A N/A] Right Calcaneus Sacrum N/A Wound Location: Pressure Injury Pressure Injury N/A Wounding Event: Pressure Ulcer Pressure Ulcer N/A Primary Etiology: Anemia, Sleep Apnea, Coronary Artery Anemia, Sleep Apnea, Coronary Artery N/A Comorbid History: Disease, Deep Vein Thrombosis, Disease, Deep Vein Thrombosis, Hypertension, Type II Diabetes, Hypertension, Type II Diabetes, History of pressure wounds, History of pressure wounds, Neuropathy Neuropathy 07/01/2021 07/01/2021 N/A Date Acquired: 26 26 N/A [name] of Treatment: Open Open N/A Wound Status: No No N/A Wound Recurrence: 2.7x2x0.2 2x2x3.2 N/A Measurements L x W x D (cm) 4.241 3.142 N/A A (cm) : rea 0.848 10.053 N/A Volume (cm) : -2.50% 0.00% N/A % Reduction in A rea: -104.80% -6.70% N/A % Reduction in Volume: Category/Stage III Category/Stage IV N/A Classification: Medium Medium N/A Exudate A mount: Serosanguineous Serosanguineous N/A Exudate Type: red, brown  red, brown N/A Exudate Color: Medium (34-66%) Large (67-100%) N/A Granulation A mount: Red, Pink Red N/A Granulation Quality: Medium (34-66%) None Present (0%) N/A Necrotic A mount: Fat Layer (Subcutaneous Tissue): Yes Fat Layer (Subcutaneous Tissue): Yes N/A Exposed Structures: Fascia: No Fascia: No Tendon: No Tendon: No Muscle: No Muscle: No Joint: No Joint: No Bone: No Bone: No None None N/A Epithelialization: Treatment Notes Electronic Signature(s) Signed: 05/28/2022 4:00:23 PM By: [name] Pax RN Entered By: [name] on 05/28/2022 08:17:21 -------------------------------------------------------------------------------- Multi-Disciplinary Care Plan Details Patient Name: Date of Service: [name] Sickles [facility] 05/28/2022 8:00 A [name] Medical Record Number: [id number] Patient Account Number: [id number] Date of Birth/Sex: Treating RN: Feb 20, 1956 (65 y.o. [name] Petit) [name] Primary Care Elad Macphail: Dione Booze Other Clinician: Referring Daleiza Bacchi: Treating Chilton Sallade/Extender: Betsey Amen,  [place] Silos in Treatment: 26 Active Inactive Wound/Skin Impairment Nursing Diagnoses: ANTHONY, TAMBURO ([id number]) [id number] Page 5 of 9 Knowledge deficit related to ulceration/compromised skin integrity Goals: Patient/caregiver will verbalize understanding of skin care regimen Date Initiated: 11/22/2021 Target Resolution Date: 05/25/2022 Goal Status: Active Ulcer/skin breakdown will have a volume reduction of 30% by week 4 Date Initiated: 11/22/2021 Date Inactivated: 01/10/2022 Target Resolution Date: 12/23/2021 Goal Status: Unmet Unmet Reason: comorbities Ulcer/skin breakdown will have a volume reduction of 50% by week 8 Date Initiated: 11/22/2021 Date Inactivated: 05/07/2022 Target Resolution Date: 01/22/2022 Goal Status: Unmet Unmet Reason: comorbidities Ulcer/skin breakdown will have a volume reduction of 80% by week 12 Date  Initiated: 11/22/2021 Date Inactivated: 05/07/2022 Target Resolution Date: 02/22/2022 Goal Status: Unmet Unmet Reason: comorbidities Ulcer/skin breakdown will heal within 14 [name] Date Initiated: 11/22/2021 Date Inactivated: 05/07/2022 Target Resolution Date: 03/25/2022 Goal Status: Unmet Unmet Reason: comorbidities Interventions: Assess patient/caregiver ability to obtain necessary supplies Assess patient/caregiver ability to perform ulcer/skin care regimen upon admission and as needed Assess ulceration(s) every visit Notes: Electronic Signature(s) Signed: 05/28/2022 4:00:23 PM By: [name] Pax RN Entered By: [name] on 05/28/2022 08:17:07 -------------------------------------------------------------------------------- Pain Assessment Details Patient Name: Date of Service: [name] [facility] 05/28/2022 8:00 A [name] Medical Record Number: [id number] Patient Account Number: [id number] Date of Birth/Sex: Treating RN: 29-Jun-1956 (65 y.o. [name] Primary Care Aquil Duhe: Dione Booze Other Clinician: Referring Lyriq Jarchow: Treating Feliz Lincoln/Extender: [name] Duff in Treatment: 26 Active Problems Location of Pain Severity and Description of Pain Patient Has Paino No Site Locations [place], [place] ([id number]) [id number] Page 6 of 9 Pain Management and Medication Current Pain Management: Electronic Signature(s) Signed: 05/28/2022 4:00:23 PM By: [name] Pax RN Entered By: [name] on 05/28/2022 08:07:01 -------------------------------------------------------------------------------- Patient/Caregiver Education Details Patient Name: Date of Service: [name] [facility] 11/28/2023andnbsp8:00 A [name] Medical Record Number: [id number] Patient Account Number: [id number] Date of Birth/Gender: Treating RN: 09-Jan-1956 (65 y.o. [name] Petit) [name] Primary Care Physician: Dione Booze Other Clinician: Referring  Physician: Treating Physician/Extender: [name] Duff in Treatment: 26 Education Assessment Education Provided To: Patient Education Topics Provided Wound/Skin Impairment: Methods: Explain/Verbal Responses: State content correctly [employer]) Signed: 05/28/2022 4:00:23 PM By: [name] Pax RN Entered By: [name] on 05/28/2022 08:46:16 -------------------------------------------------------------------------------- Wound Assessment Details Patient Name: Date of Service: [name] [facility] 05/28/2022 8:00 A [name] Medical Record Number: [id number] Patient Account Number: [id number] Date of Birth/Sex: Treating RN: 09-Mar-1956 (65 y.o. [name] Primary Care Josilyn Shippee: Dione Booze Other Clinician: Referring Jacaden Forbush: Treating Nikkia Devoss/Extender: [name] Duff in Treatment: 26 Wound Status Wound Number: 5 Primary Pressure Ulcer Etiology: Wound Location: Right Calcaneus Matsuo, [name] ([id number]) [id number] Page 7 of 9 Wound Open Wounding Event: Pressure Injury Status: Date Acquired: 07/01/2021 Comorbid Anemia, Sleep Apnea, Coronary Artery Disease, Deep Vein [name] Of Treatment: 26 History: Thrombosis, Hypertension, Type II Diabetes, History of pressure Clustered Wound: No wounds, Neuropathy Photos Wound Measurements Length: (cm) 2.7 Width: (cm) 2 Depth: (cm) 0.2 Area: (cm) 4.241 Volume: (cm) 0.848 % Reduction in Area: -2.5% % Reduction in Volume: -104.8% Epithelialization: None Tunneling: No Undermining: No Wound Description Classification: Category/Stage III Exudate Amount: Medium Exudate Type: Serosanguineous Exudate Color: red, brown Foul Odor After Cleansing: No Slough/Fibrino Yes Wound Bed Granulation Amount: Medium (34-66%) Exposed Structure Granulation Quality: Red, Pink Fascia Exposed: No Necrotic Amount: Medium (34-66%) Fat Layer (Subcutaneous Tissue)  Exposed: Yes Necrotic Quality: Adherent Slough Tendon Exposed: No Muscle Exposed: No Joint Exposed: No Bone Exposed: No Treatment  Notes Wound #5 (Calcaneus) Wound Laterality: Right Cleanser Peri-Wound Care Topical Primary Dressing Hydrofera Blue Ready Transfer Foam, 2.5x2.5 (in/in) Discharge Instruction: Apply Hydrofera Blue Ready to wound bed as directed Secondary Dressing (BORDER) Zetuvit Plus SILICONE BORDER Dressing 4x4 (in/in) Discharge Instruction: Please do not put silicone bordered dressings under wraps. Use non-bordered dressing only. Secured With Compression Wrap Compression Stockings Facilities manager) Signed: 05/28/2022 4:00:23 PM By: [name] Pax RN Entered By: [name] on 05/28/2022 08:16:13 [place], [name] ([id number]) [id number] Page 8 of 9 -------------------------------------------------------------------------------- Wound Assessment Details Patient Name: Date of Service: [name] [facility] 05/28/2022 8:00 A [name] Medical Record Number: [id number] Patient Account Number: [id number] Date of Birth/Sex: Treating RN: 29-Jan-1956 (65 y.o. [name] Petit) [name] Primary Care Demetra Moya: Dione Booze Other Clinician: Referring Jarad Barth: Treating Makynleigh Breslin/Extender: [name] Duff in Treatment: 26 Wound Status Wound Number: 7 Primary Pressure Ulcer Etiology: Wound Location: Sacrum Wound Open Wounding Event: Pressure Injury Status: Date Acquired: 07/01/2021 Comorbid Anemia, Sleep Apnea, Coronary Artery Disease, Deep Vein [name] Of Treatment: 26 History: Thrombosis, Hypertension, Type II Diabetes, History of pressure Clustered Wound: No wounds, Neuropathy Photos Wound Measurements Length: (cm) 2 Width: (cm) 2 Depth: (cm) 3.2 Area: (cm) 3.142 Volume: (cm) 10.053 % Reduction in Area: 0% % Reduction in Volume: -6.7% Epithelialization: None Tunneling: No Undermining: No Wound  Description Classification: Category/Stage IV Exudate Amount: Medium Exudate Type: Serosanguineous Exudate Color: red, brown Foul Odor After Cleansing: No Slough/Fibrino Yes Wound Bed Granulation Amount: Large (67-100%) Exposed Structure Granulation Quality: Red Fascia Exposed: No Necrotic Amount: None Present (0%) Fat Layer (Subcutaneous Tissue) Exposed: Yes Tendon Exposed: No Muscle Exposed: No Joint Exposed: No Bone Exposed: No Treatment Notes Wound #7 (Sacrum) Cleanser Peri-Wound Care Topical Eberlein, [name] ([id number]) [id number] Page 9 of 9 calmoseptine Discharge Instruction: apply to excorated areas Primary Dressing Hydrofera Blue Ready Transfer Foam, 4x5 (in/in) Discharge Instruction: spiral hydrofera blue to lightly pack into wound, then cut to cover wound Secondary Dressing ABD Pad 5x9 (in/in) Discharge Instruction: Cover with ABD pad Secured With Medipore T - 45M Medipore H Soft Cloth Surgical T ape ape, 2x2 (in/yd) Compression Wrap Compression Stockings Add-Ons Electronic Signature(s) Signed: 05/28/2022 4:00:23 PM By: [name] Pax RN Entered By: [name] on 05/28/2022 08:16:29 -------------------------------------------------------------------------------- Vitals Details Patient Name: Date of Service: [name] [facility] 05/28/2022 8:00 A [name] Medical Record Number: [id number] Patient Account Number: [id number] Date of Birth/Sex: Treating RN: Jul 30, 1955 (65 y.o. [name] Petit) [name] Primary Care Samarth Ogle: Dione Booze Other Clinician: Referring Adela Esteban: Treating Leiland Mihelich/Extender: [name] Duff in Treatment: 26 Vital Signs Time Taken: 08:06 Temperature (F): 97.7 Height (in): 66 Pulse (bpm): 90 Weight (lbs): 279 Respiratory Rate (breaths/min): 20 Body Mass Index (BMI): 45 Blood Pressure (mmHg): 149/96 Reference Range: 80 - 120 mg / dl Electronic Signature(s) Signed: 05/28/2022  4:00:23 PM By: [name] Pax RN Entered By: [name] on 05/28/2022 08:06:55

## 2022-06-04 ENCOUNTER — Ambulatory Visit (INDEPENDENT_AMBULATORY_CARE_PROVIDER_SITE_OTHER): Payer: Medicare Other | Admitting: Psychiatry

## 2022-06-04 ENCOUNTER — Encounter: Payer: Self-pay | Admitting: Psychiatry

## 2022-06-04 VITALS — BP 157/79 | HR 85 | Temp 97.8°F | Ht 66.0 in | Wt 229.0 lb

## 2022-06-04 DIAGNOSIS — G4701 Insomnia due to medical condition: Secondary | ICD-10-CM

## 2022-06-04 DIAGNOSIS — F0390 Unspecified dementia without behavioral disturbance: Secondary | ICD-10-CM

## 2022-06-04 DIAGNOSIS — F411 Generalized anxiety disorder: Secondary | ICD-10-CM

## 2022-06-04 DIAGNOSIS — F3178 Bipolar disorder, in full remission, most recent episode mixed: Secondary | ICD-10-CM | POA: Diagnosis not present

## 2022-06-04 MED ORDER — OXCARBAZEPINE 300 MG PO TABS: 300.0000 mg | ORAL_TABLET | Freq: Two times a day (BID) | ORAL | 1 refills | Status: DC

## 2022-06-04 NOTE — Progress Notes (Unsigned)
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  Patient being a limited historian, wife provided collateral information.  Per wife patient is overall doing fairly well with regards to his mood although he does get anxious on and off when left alone.  He is also anxious to get up and walk.  Although he is able to do so he is afraid of falling.  Currently compliant on medications.  Denies side effects.  Per wife patient completed physical therapy.  He currently is recovering from a UTI and just completed a course of Bactrim.  Reports sleep is good.  Reports appetite is fair.  Patient appeared to be alert, oriented to person place time situation.  Remote memory immediately 3 out of 3, after 5 minutes 3 out of 3.  Patient was able to do serial threes, attention and focus seem to be good.  Patient agreeable to start psychotherapy session.  Patient denies any suicidality, homicidality or perceptual disturbances.  Patient denies any other concerns today.  Visit Diagnosis:    ICD-10-CM   1. Bipolar disorder, in full remission, most recent episode mixed (HCC)  F31.78 Oxcarbazepine (TRILEPTAL) 300 MG tablet    2. GAD (generalized anxiety disorder)  F41.1     3. Insomnia due to medical condition  G47.01    mood    4. 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  Past Medical History:  Past Medical History:  Diagnosis Date   Bipolar disorder (HCC)    Diabetes mellitus without complication (HCC)    History of blood clots    Hypertension    TBI (traumatic brain injury) (HCC)     Past Surgical History:  Procedure Laterality Date   BACK SURGERY     CARPAL TUNNEL RELEASE Bilateral    COLON SURGERY     COLOSTOMY     IR PERC TUN PERIT CATH WO PORT S&I /IMAG  10/11/2021   IR REMOVAL TUN CV CATH W/O FL  11/19/2021   IR US GUIDE VASC ACCESS RIGHT  10/11/2021   TONSILLECTOMY      Family Psychiatric History: Reviewed family psychiatric history from progress note on 09/09/2017.  Family History:  Family History  Problem Relation Age of Onset   Depression Father    Deep vein thrombosis Father     Social History: Reviewed social history from progress note on 09/09/2017. Social History   Socioeconomic History   Marital status: Married    Spouse name: thelma   Number of children: 0   Years of education: Not on file   Highest education level: Associate degree: occupational, Scientist, product/process development, or vocational program  Occupational History   Not on file  Tobacco Use   Smoking status: Never    Passive exposure: Never   Smokeless tobacco: Never  Vaping Use   Vaping Use: Never used  Substance and Sexual Activity   Alcohol use: Not Currently   Drug use: No  Sexual activity: Not on file  Other Topics Concern   Not on file  Social History Narrative   Not on file   Social Determinants of Health   Financial Resource Strain: Low Risk  (09/09/2017)   Overall Financial Resource Strain (CARDIA)    Difficulty of Paying Living Expenses: Not hard at all  Food Insecurity: No Food Insecurity (09/09/2017)   Hunger Vital Sign    Worried About Running Out of Food in the Last Year: Never true    Ran Out of Food in the Last Year: Never true  Transportation Needs: No Transportation Needs (09/09/2017)   PRAPARE - Therapist, artTransportation     Lack of Transportation (Medical): No    Lack of Transportation (Non-Medical): No  Physical Activity: Inactive (09/09/2017)   Exercise Vital Sign    Days of Exercise per Week: 0 days    Minutes of Exercise per Session: 0 min  Stress: No Stress Concern Present (09/09/2017)   [employer] - Occupational Stress Questionnaire    Feeling of Stress : Not at all  Social Connections: Unknown (09/09/2017)   Social Connection and Isolation Panel [NHANES]    Frequency of Communication with Friends and Family: Not on file    Frequency of Social Gatherings with Friends and Family: Not on file    Attends Religious Services: More than 4 times per year    Active Member of Clubs or Organizations: No    Attends Engineer, structuralClub or Organization Meetings: Never    Marital Status: Married    Allergies: No Known Allergies  Metabolic Disorder Labs: Lab Results  Component Value Date   HGBA1C 6.8 (H) 01/05/2022   MPG 148.46 01/05/2022   MPG 163 06/22/2021   No results found for: "PROLACTIN" Lab Results  Component Value Date   CHOL 141 04/14/2021   TRIG 45 04/14/2021   HDL 44 04/14/2021   CHOLHDL 3.2 04/14/2021   VLDL 9 04/14/2021   LDLCALC 88 04/14/2021   Lab Results  Component Value Date   TSH 2.462 07/29/2021   TSH 2.175 04/14/2021    Therapeutic Level Labs: Lab Results  Component Value Date   LITHIUM 1.24 (H) 01/04/2016   LITHIUM 1.51 (HH) 01/03/2016   No results found for: "VALPROATE" No results found for: "CBMZ"  Current Medications: Current Outpatient Medications  Medication Sig Dispense Refill   amLODipine-olmesartan (AZOR) 5-20 MG tablet Take 1 tablet by mouth daily.     BACTRIM DS 800-160 MG tablet Take 1 tablet by mouth 2 (two) times daily.     cholecalciferol (VITAMIN D) 25 MCG tablet Take 1 tablet (1,000 Units total) by mouth daily. 30 tablet 0   Continuous Blood Gluc Receiver (DEXCOM G7 RECEIVER) DEVI UAD for blood sugar monitoring.     ELIQUIS 5 MG  TABS tablet Take 5 mg by mouth 2 (two) times daily.      fish oil-omega-3 fatty acids 1000 MG capsule SMARTSIG:1 Capsule(s) By Mouth Daily     glipiZIDE (GLUCOTROL XL) 2.5 MG 24 hr tablet Take 1 tablet (2.5 mg total) by mouth daily with breakfast. 30 tablet 0   memantine (NAMENDA) 10 MG tablet Take 10 mg by mouth 2 (two) times daily.     QUEtiapine (SEROQUEL) 50 MG tablet TAKE 1/2 TO 1 (ONE-HALF TO ONE) TABLET BY MOUTH ONCE DAILY AS NEEDED FOR  AGITATION 90 tablet 0   rosuvastatin (CRESTOR) 20 MG tablet Take 20 mg by mouth daily.      vitamin B-12 (CYANOCOBALAMIN) 1000 MCG  tablet Take 3,000 mcg by mouth daily.     Oxcarbazepine (TRILEPTAL) 300 MG tablet Take 1 tablet (300 mg total) by mouth 2 (two) times daily. 180 tablet 1   No current facility-administered medications for this visit.     Musculoskeletal: Strength & Muscle Tone:  UTA Gait & Station:  WHeelchair bound Patient leans: N/A  Psychiatric Specialty Exam: Review of Systems  Psychiatric/Behavioral: Negative.    All other systems reviewed and are negative.   Blood pressure (S) (!) 157/79, pulse 85, temperature 97.8 F (36.6 C), temperature source Oral, height 5\' 6"  (1.676 m), weight 229 lb (103.9 kg).Body mass index is 36.96 kg/m.  General Appearance: Casual  Eye Contact:  Fair  Speech:  Clear and Coherent  Volume:  Normal  Mood:  Euthymic  Affect:  Congruent  Thought Process:  Goal Directed and Descriptions of Associations: Intact  Orientation:  Full (Time, Place, and Person)  Thought Content: Logical   Suicidal Thoughts:  No  Homicidal Thoughts:  No  Memory:  Immediate;   Fair Recent;   Fair Remote;   Fair  Judgement:  Fair  Insight:  Fair  Psychomotor Activity:  Normal  Concentration:  Concentration: Fair and Attention Span: Fair  Recall:  of Knowledge: Fair  Language: Fair  Akathisia:  No  Handed:  Right  AIMS (if indicated): done  Assets:  Communication Skills Desire for  Improvement Housing Intimacy Social Support Transportation  ADL's:  Intact  Cognition: WNL  Sleep:  Fair   Screenings: AIMS    Flowsheet Row Office Visit from 02/27/2022 in [facility] Office Visit from 03/10/2018 in [facility]  AIMS Total Score 0 0      GAD-7    Flowsheet Row Office Visit from 02/27/2022 in [facility]  Total GAD-7 Score 2      PHQ2-9    Flowsheet Row Office Visit from 02/27/2022 in [facility] Video Visit from 11/08/2021 in [facility] Office Visit from 08/30/2021 in [facility] Video Visit from 12/05/2020 in [facility] Office Visit from 10/17/2020 in [facility]  PHQ-2 Total Score 1 0 3 1 0  PHQ-9 Total Score -- -- 6 2 --      Flowsheet Row ED to Hosp-Admission (Discharged) from 03/20/2022 in [facility] Office Visit from 02/27/2022 in [facility] ED to Hosp-Admission (Discharged) from 02/22/2022 in [facility] PCU  C-SSRS RISK CATEGORY No Risk No Risk No Risk        Assessment and Plan: Perkins Molina is a 66 year old African-American male with history of bipolar disorder type II, diabetes mellitus, chronic kidney disease stage IIIb, congestive heart failure, multiple other medical problems, presented for medication management.  Patient is currently doing well with his mood however will benefit from psychotherapy sessions for his anxiety about being alone and as well as walking.  Plan as noted below.  Plan Bipolar type II in remission Trileptal 300 mg p.o. twice daily Seroquel 50 mg p.o. nightly as needed for severe anxiety/agitation only.  Insomnia-stable Patient is sleeping well without medications.  GAD-improving Will refer for CBT due to his  anxiety about being alone as well as anxiety about walking. Does have Seroquel available as needed for anxiety/agitation.  Major neuro cognitive disorder chronic-currently under the care of neurology.  Patient on memantine.  Reviewed and discussed most recent labs-03/20/2022-AST ALT within  normal limits, BMP-within normal limits 03/23/2022, CBC-within normal limits 03/23/2022.  Collateral information obtained from spouse as noted above.  Follow-up in clinic in 6 months or sooner if needed. This note was generated in part or whole with voice recognition software. Voice recognition is usually quite accurate but there are transcription errors that can and very often do occur. I apologize for any typographical errors that were not detected and corrected.     [name], MD 06/04/2022, 3:37 PM

## 2022-06-11 LAB — COLOGUARD: COLOGUARD: NEGATIVE

## 2022-06-18 ENCOUNTER — Encounter: Payer: Medicare Other | Attending: Physician Assistant | Admitting: Physician Assistant

## 2022-06-18 DIAGNOSIS — L89613 Pressure ulcer of right heel, stage 3: Secondary | ICD-10-CM | POA: Diagnosis not present

## 2022-06-18 DIAGNOSIS — Z87820 Personal history of traumatic brain injury: Secondary | ICD-10-CM | POA: Insufficient documentation

## 2022-06-18 DIAGNOSIS — M6281 Muscle weakness (generalized): Secondary | ICD-10-CM | POA: Diagnosis not present

## 2022-06-18 DIAGNOSIS — Z86718 Personal history of other venous thrombosis and embolism: Secondary | ICD-10-CM | POA: Diagnosis not present

## 2022-06-18 DIAGNOSIS — L89623 Pressure ulcer of left heel, stage 3: Secondary | ICD-10-CM | POA: Insufficient documentation

## 2022-06-18 DIAGNOSIS — Z7901 Long term (current) use of anticoagulants: Secondary | ICD-10-CM | POA: Insufficient documentation

## 2022-06-18 DIAGNOSIS — L89154 Pressure ulcer of sacral region, stage 4: Secondary | ICD-10-CM | POA: Insufficient documentation

## 2022-06-18 DIAGNOSIS — L98412 Non-pressure chronic ulcer of buttock with fat layer exposed: Secondary | ICD-10-CM | POA: Insufficient documentation

## 2022-06-18 DIAGNOSIS — I10 Essential (primary) hypertension: Secondary | ICD-10-CM | POA: Insufficient documentation

## 2022-06-18 DIAGNOSIS — F319 Bipolar disorder, unspecified: Secondary | ICD-10-CM | POA: Insufficient documentation

## 2022-06-18 DIAGNOSIS — E11622 Type 2 diabetes mellitus with other skin ulcer: Secondary | ICD-10-CM | POA: Diagnosis not present

## 2022-06-18 DIAGNOSIS — L24A0 Irritant contact dermatitis due to friction or contact with body fluids, unspecified: Secondary | ICD-10-CM | POA: Diagnosis not present

## 2022-06-18 NOTE — Progress Notes (Signed)
VONG, GARRINGER ([id number]) [id number] Page 1 of 9 Visit Report for 06/18/2022 Chief Complaint Document Details Patient Name: Date of Service: [name] [facility] 06/18/2022 1:15 PM Medical Record Number: [id number] Patient Account Number: [id number] Date of Birth/Sex: Treating RN: 08/20/1955 (66 y.o. [name]) [name] Primary Care Provider: [name] Other Clinician: Referring Provider: Treating Provider/Extender: [name]: 29 Information Obtained from: Patient Chief Complaint Sacral, right gluteal, and bilateral heel ulcers Electronic Signature(s) Signed: 06/18/2022 1:36:19 PM By: [name] PA-C Entered By: [name] on 06/18/2022 13:36:19 -------------------------------------------------------------------------------- HPI Details Patient Name: Date of Service: [name] [facility] 06/18/2022 1:15 PM Medical Record Number: [id number] Patient Account Number: [id number] Date of Birth/Sex: Treating RN: 03/28/1956 (66 y.o. Oval Linsey Primary Care Provider: [name] Other Clinician: Referring Provider: Treating Provider/Extender: [name]: 29 History of Present Illness HPI Description: 05/29/2021 this is a patient who presents today for initial inspection here in the clinic concerning wounds that he has over the right heel and the sacral region. Unfortunately the sacral wound is starting to spread off to the right gluteal location due to how he sits always leaning towards the right side in his chair. His wife is present she is the primary caregiver though she is not home with him all the time she does have to work. She does do an excellent job however it appears to be in trying to keep things under good control for him. The patient is not able to change positions himself nor walk by himself so he is pretty much at the mercy of the  position he is putting when she is gone and this tends to be his chair which she sits and most of the day. Obviously this I think is the main culprit for what is going on currently. It was actually in January 2020 when the sacral wound started. It was in September 2022 when the wound started to spread more to the right gluteal location. Subsequently in August 2022 is when he had been in a skilled nursing facility and the heel started to give him trouble as well. That does not seem to be doing nearly as poorly as the sacral region. He was hospitalized in October 2022 secondary to sepsis and this was in regard to the foot and was sent to skilled nursing again he is now back at home. He did have a wound VAC for the sacral wound over the summer 2022 but being in and out of facilities this ended up getting sent back. The patient does have [facility] home health that comes out 1 time per week to help with care. His most recent hemoglobin A1c was 6.9 in August 2022. 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 s. WAYLON, HERSHEY ([id number]) [id number] Page 2 of 9 07/24/2021 upon evaluation today patient appears to be doing decently well in regard to the pressure ulcer on the right heel as well as the sacral region. In general I think you are making some progress here which is great news. Overall the heel unfortunately had already closed previously when we saw him although it apparently reopened when he was working with physical therapy according to his wife. The area in the sacral region is doing well and looks clean there is still some depth here  but I still think it would be difficult to wound VAC this region. His wife does an awesome job taking care of him. Is been so  long since we have seen him because he has been in the hospital to be honest. 08/07/2021 upon evaluation today patient appears to be doing well at this time. Fortunately I do not see any signs of active infection locally or systemically at this time which is great news. No fevers, chills, nausea, vomiting, or diarrhea. Unfortunately after I saw him on the 24th he actually ended up in the hospital in the 27th due to being septic. This was not due to the wounds but after looking at records actually due to a UTI. Fortunately he is doing much better and very happy in that regard. I do not see any signs of infection locally nor systemically at this time. 08/21/2021 upon evaluation today patient appears to be doing well with regard to his wound. Fortunately I think that the sacral region is doing decently well at this point which is great news. With regard to the foot this also does have some slough noted but I feel like we are making progress here. He does have some irritation around the right upper thigh/gluteal region. I feel like it is more towards the thigh. Nonetheless this does appear to be pressure related he spends a lot of time sitting up his caregiver states he really will not get in the bed and stay there like he should. 09/04/2021 upon evaluation patient appears to be doing decently well today in regard to the wound on his heel as well as the sacral area. I am actually very pleased with both and how things are appearing currently. There does not appear to be any evidence of active infection I think that his caregiver is doing an awesome job with regard to the wound care here. She is present today as well and I did discuss this with her. 09/25/2021 upon evaluation today patient appears to be doing well with regard to his wound on the sacral region. His right heel is also doing well. Unfortunately he has a new area on his left heel which does appear to be pressure injury. I do not see any evidence of  active infection locally or systemically which is great news no fevers, chills, nausea, vomiting, or diarrhea. Readmission: 11-22-2021 upon evaluation today patient presents for reevaluation here in the clinic concerning issues with his wounds. Since have last seen him he was in the hospital and then subsequently ended up in a skilled nursing facility. Subsequently period of time in the facility he actually had a breakdown in the right thigh location which has been an issue for him. Fortunately I do not see any evidence of active infection locally or systemically which is great news and I am very pleased in that regard. Nonetheless he does have still the wounds on both heels as well as the wound in the sacral area and the new area in the right upper thigh/gluteal region. 12-13-2021 upon evaluation today patient appears to be doing well currently in regard to his wounds. The left heel is going to require some sharp debridement. Fortunately the right heel seems to be doing quite well. Overall I think his gluteal region as well as sacral region also are doing well. 01-10-2022 upon evaluation today patient appears to be doing better in regard to some areas unfortunately his sacral area is not doing as well. It seems of gotten worse his caregiver states  when he was in the hospital recently they really did not offloading. Fortunately I do not see any evidence of active infection locally or systemically at this time. 02-01-2022 upon evaluation today patient appears to be doing well currently in regard to his wounds. He is going require some sharp debridement of clearway some of the necrotic debris. Fortunately I do not see any evidence of active infection locally or systemically which is great news. No fevers, chills, nausea, vomiting, or diarrhea. 02-22-2022 upon evaluation today patient appears to be doing poorly in regard to his right heel the left heel is completely closed. Unfortunately I think he is  got some deep tissue injury to this area I am going to do a culture at this spot. With that being said he is having 2 other significant issues in regard to the left thigh this is showing signs of cellulitis all the way down into the thigh and was seeing definite temperature difference in the left thigh versus the right thigh when feeling. It also is of note that the patient is also having some issues here with trouble in regard to having fevers his wife has been giving him Tylenol and ibuprofen to help keep the fevers under control but he has been as high as 102 this is pretty much been going on since Monday or Tuesday of this week. Nonetheless I am concerned I am not exactly sure where the cellulitis on the left thigh is coming from not sure if this is emanating from his sacral region that is a possibility or if there is something completely different going on either way with the fevers I am more worried that he needs to go get this checked out as soon as possible. 03-08-2022 upon evaluation today patient appears to be doing well with regard to his heel as well as sacral region. Both are showing signs of significant improvement compared to the last time I saw him. Overall I think we are in a much better spot 04-04-2022 upon evaluation today patient appears to be doing about the same in regard to his wounds. I do not see any signs of improvement nor worsening at this point. Fortunately there is no evidence of active infection at this time. He is not staying off of this is much as he needs to in fact is not even sleeping in the hospital bed with the alternating air mattress. 04-16-2022 upon evaluation today patient appears to be doing well currently in regard to his wound. He has been tolerating the dressing changes without complication. Fortunately I do not see any evidence of infection. The heel actually looks quite well the sacral area actually is still quite deep. From my questioning and discussion  with him today I do not believe that he is really staying off of this as much as he should. He seemed a little uncomfortable during that portion of the conversation today. I think that is contributing quite a bit to his lack of progress with regard to healing. 11/7; patient with decubitus ulcers on his right heel as well as his lower sacrum. We have been using Hydrofera Blue. He has home health predominantly for Foley catheter management. There have been some issues getting the Hydrofera Blue through home health but apparently the patient's wife has some supplies and is managing to get these changed.They come every 3 weeks because of transportation difficulties 05-28-2022 upon evaluation today patient appears to be doing decently well currently in regard to his wounds. He still has a  depth to the wound in the sacral region but again this does seem to be a little bit less than previous. He has been using Hydrofera Blue both on this in the heel ulcer. Both are going to require some cleaning today sharp debridement on the heel I think is cleaning with saline gauze and the sterile Q-tip is probably sufficient for the sacral area which does not appear to have too much necrotic tissue. 06-18-2022 upon evaluation today patient's wounds are showing signs of doing decently well. Fortunately I do not see any signs of active infection locally nor systemically at this time which is great news. No fevers, chills, nausea, vomiting, or diarrhea. Electronic Signature(s) Signed: 06/18/2022 1:44:58 PM By: [name] PA-C Entered By: [name] on 06/18/2022 13:44:58 Chap, [name] ([id number]) [id number] Page 3 of 9 -------------------------------------------------------------------------------- Physical Exam Details Patient Name: Date of Service: [name] [facility] 06/18/2022 1:15 PM Medical Record Number: [id number] Patient Account Number: [id number] Date of  Birth/Sex: Treating RN: Jun 04, 1956 (66 y.o. [name]) [name] Primary Care Provider: [name] Other Clinician: Referring Provider: Treating Provider/Extender: [name]: 29 Constitutional Well-nourished and well-hydrated in no acute distress. Respiratory normal breathing without difficulty. Psychiatric this patient is able to make decisions and demonstrates good insight into disease process. Alert and Oriented x 3. pleasant and cooperative. Notes Upon inspection patient's wounds are showing signs of good granulation and I do not see any new deep tissue injuries or pressure injuries in general which is great news and overall I am extremely pleased in that regard. Fortunately there is no evidence of infection locally nor systemically at this time which is also great news. Electronic Signature(s) Signed: 06/18/2022 1:46:50 PM By: [name] PA-C Entered By: [name] on 06/18/2022 13:46:50 -------------------------------------------------------------------------------- Physician Orders Details Patient Name: Date of Service: [name] [facility] 06/18/2022 1:15 PM Medical Record Number: [id number] Patient Account Number: [id number] Date of Birth/Sex: Treating RN: May 12, 1956 (66 y.o. [name]) [name] Primary Care Provider: [name] Other Clinician: Referring Provider: Treating Provider/Extender: [name]: 29 Verbal / Phone Orders: No Diagnosis Coding ICD-10 Coding Code Description L89.613 Pressure ulcer of right heel, stage 3 L89.623 Pressure ulcer of left heel, stage 3 L89.154 Pressure ulcer of sacral region, stage 4 L24.A0 Irritant contact dermatitis due to friction or contact with body fluids, unspecified L98.412 Non-pressure chronic ulcer of buttock with fat layer exposed M62.81 Muscle weakness (generalized) Hallmark, Baruc ([id number])  [id number] Page 4 of 9 F31.9 Bipolar disorder, unspecified E11.622 Type 2 diabetes mellitus with other skin ulcer I10 Essential (primary) hypertension Z79.01 Long term (current) use of anticoagulants 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 [place] for wound care. May utilize formulary equivalent dressing for wound treatment orders unless otherwise specified. Home Health Nurse may visit PRN to address patients wound care needs. Lajean Manes [phone] Scheduled days for dressing changes to be completed; exception, patient has scheduled wound care visit that day. **Please direct any NON-WOUND related issues/requests for orders to patient's Primary Care Physician. **If current dressing causes regression in wound condition, may D/C ordered dressing product/s and apply Normal Saline Moist Dressing daily until next [place] or Other MD appointment. **Notify Wound Healing Center of regression in wound condition at [phone]. Bathing/ Shower/ Hygiene No tub bath. Anesthetic (Use 'Patient Medications' Section for Anesthetic Order Entry) Lidocaine applied to wound bed Edema Control -  Lymphedema / Segmental Compressive Device / Other Elevate, Exercise Daily and A void Standing for Long Periods of Time. Elevate legs to the level of the heart and pump ankles as often as possible Elevate leg(s) parallel to the floor when sitting. DO YOUR BEST to sleep in the bed at night. DO NOT sleep in your recliner. Long hours of sitting in a recliner leads to swelling of the legs and/or potential wounds on your backside. Off-Loading Gel wheelchair cushion Low air-loss mattress (Group 2) - Sleep in bed every night. Turn and reposition every 2 hours - keep pressure off of the sacrum and heels wounds Other: - PRAFO boot in bed keep pressure off of sacrum/gluteus and heels- Additional  Orders / Instructions Follow Nutritious Diet and Increase Protein Intake Wound Treatment Wound #5 - Calcaneus Wound Laterality: Right Prim Dressing: Hydrofera Blue Ready Transfer Foam, 2.5x2.5 (in/in) 3 x Per Week/30 Days ary Discharge Instructions: Apply Hydrofera Blue Ready to wound bed as directed Secondary Dressing: (BORDER) Zetuvit Plus SILICONE BORDER Dressing 4x4 (in/in) 3 x Per Week/30 Days Discharge Instructions: Please do not put silicone bordered dressings under wraps. Use non-bordered dressing only. Wound #7 - Sacrum Topical: calmoseptine 1 x Per Day/30 Days Discharge Instructions: apply to excorated areas Prim Dressing: Hydrofera Blue Ready Transfer Foam, 4x5 (in/in) 1 x Per Day/30 Days ary Discharge Instructions: spiral hydrofera blue to lightly pack into wound, then cut to cover wound Secondary Dressing: ABD Pad 5x9 (in/in) 1 x Per Day/30 Days Discharge Instructions: Cover with ABD pad Secured With: Medipore T - 44M Medipore H Soft Cloth Surgical T ape ape, 2x2 (in/yd) 1 x Per Day/30 Days Electronic Signature(s) Unsigned Entered ByCarlene Coria on 06/18/2022 13:37:37 Signature(s): Santana, [name] ([id number]) [id number] Date(s): [id number] Page 5 of 9 -------------------------------------------------------------------------------- Problem List Details Patient Name: Date of Service: [name] [facility] 06/18/2022 1:15 PM Medical Record Number: [id number] Patient Account Number: [id number] Date of Birth/Sex: Treating RN: Oct 11, 1955 (66 y.o. [name]) [name] Primary Care Provider: [name] Other Clinician: Referring Provider: Treating Provider/Extender: [name]: 29 Active Problems ICD-10 Encounter Code Description Active Date MDM Diagnosis L89.613 Pressure ulcer of right heel, stage 3 11/22/2021 No Yes L89.623 Pressure ulcer of left heel, stage 3 11/22/2021 No Yes L89.154 Pressure ulcer of  sacral region, stage 4 11/22/2021 No Yes L24.A0 Irritant contact dermatitis due to friction or contact with body fluids, 11/22/2021 No Yes unspecified L98.412 Non-pressure chronic ulcer of buttock with fat layer exposed 11/22/2021 No Yes M62.81 Muscle weakness (generalized) 11/22/2021 No Yes F31.9 Bipolar disorder, unspecified 11/22/2021 No Yes E11.622 Type 2 diabetes mellitus with other skin ulcer 11/22/2021 No Yes I10 Essential (primary) hypertension 11/22/2021 No Yes Z79.01 Long term (current) use of anticoagulants 11/22/2021 No Yes 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 11/22/2021 No Yes Inactive Problems Resolved Problems DHILLON, COMUNALE ([id number]) [id number] Page 6 of 9 Electronic Signature(s) Signed: 06/18/2022 1:36:16 PM By: [name] PA-C Entered By: [name] on 06/18/2022 13:36:15 -------------------------------------------------------------------------------- Progress Note Details Patient Name: Date of Service: [name] [facility] 06/18/2022 1:15 PM Medical Record Number: [id number] Patient Account Number: [id number] Date of Birth/Sex: Treating RN: 1955/09/22 (66 y.o. [name]) [name] Primary Care Provider: [name] Other Clinician: Referring Provider: Treating Provider/Extender: [name]: 29 Subjective Chief Complaint Information obtained from Patient Sacral, right gluteal, and bilateral heel ulcers History of Present Illness (HPI) 05/29/2021 this is a patient who presents today for initial inspection here  in the clinic concerning wounds that he has over the right heel and the sacral region. Unfortunately the sacral wound is starting to spread off to the right gluteal location due to how he sits always leaning towards the right side in his chair. His wife is present she is the primary caregiver though she is not home with him all the time she does have to work. She  does do an excellent job however it appears to be in trying to keep things under good control for him. The patient is not able to change positions himself nor walk by himself so he is pretty much at the mercy of the position he is putting when she is gone and this tends to be his chair which she sits and most of the day. Obviously this I think is the main culprit for what is going on currently. It was actually in January 2020 when the sacral wound started. It was in September 2022 when the wound started to spread more to the right gluteal location. Subsequently in August 2022 is when he had been in a skilled nursing facility and the heel started to give him trouble as well. That does not seem to be doing nearly as poorly as the sacral region. He was hospitalized in October 2022 secondary to sepsis and this was in regard to the foot and was sent to skilled nursing again he is now back at home. He did have a wound VAC for the sacral wound over the summer 2022 but being in and out of facilities this ended up getting sent back. The patient does have [facility] home health that comes out 1 time per week to help with care. His most recent hemoglobin A1c was 6.9 in August 2022. 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 s. 07/24/2021 upon evaluation today patient appears to be doing decently well in regard to the pressure ulcer on the right heel as well as the sacral region. In general I think you are making some progress here which is great news. Overall the heel unfortunately had already closed previously when we saw him although it apparently reopened when he was working with physical therapy according to his wife. The area in the sacral region is doing well and looks clean there is still some depth  here but I still think it would be difficult to wound VAC this region. His wife does an awesome job taking care of him. Is been so long since we have seen him because he has been in the hospital to be honest. 08/07/2021 upon evaluation today patient appears to be doing well at this time. Fortunately I do not see any signs of active infection locally or systemically at this time which is great news. No fevers, chills, nausea, vomiting, or diarrhea. Unfortunately after I saw him on the 24th he actually ended up in the hospital in the 27th due to being septic. This was not due to the wounds but after looking at records actually due to a UTI. Fortunately he is doing much better and very happy in that regard. I do not see any signs of infection locally nor systemically at this time. 08/21/2021 upon evaluation today patient appears to be doing well with regard to his wound. Fortunately I think that the sacral region is doing decently well at this point  which is great news. With regard to the foot this also does have some slough noted but I feel like we are making progress here. He does have some irritation around the right upper thigh/gluteal region. I feel like it is more towards the thigh. Nonetheless this does appear to be pressure related he spends a lot of time sitting up his caregiver states he really will not get in the bed and stay there like he should. 09/04/2021 upon evaluation patient appears to be doing decently well today in regard to the wound on his heel as well as the sacral area. I am actually very pleased with both and how things are appearing currently. There does not appear to be any evidence of active infection I think that his caregiver is doing an awesome job with regard to the wound care here. She is present today as well and I did discuss this with her. 09/25/2021 upon evaluation today patient appears to be doing well with regard to his wound on the sacral region. His right heel is also  doing well. Unfortunately he has a new area on his left heel which does appear to be pressure injury. I do not see any evidence of active infection locally or systemically which is great news no fevers, chills, nausea, vomiting, or diarrhea. Readmission: 11-22-2021 upon evaluation today patient presents for reevaluation here in the clinic concerning issues with his wounds. Since have last seen him he was in the hospital and then subsequently ended up in a skilled nursing facility. Subsequently period of time in the facility he actually had a breakdown in the right thigh location which has been an issue for him. Fortunately I do not see any evidence of active infection locally or systemically which is great news and I am very pleased in that regard. Nonetheless he does have still the wounds on both heels as well as the wound in the sacral area and the new area in the right upper thigh/gluteal region. SHAYA, ALTAMURA ([id number]) [id number] Page 7 of 9 12-13-2021 upon evaluation today patient appears to be doing well currently in regard to his wounds. The left heel is going to require some sharp debridement. Fortunately the right heel seems to be doing quite well. Overall I think his gluteal region as well as sacral region also are doing well. 01-10-2022 upon evaluation today patient appears to be doing better in regard to some areas unfortunately his sacral area is not doing as well. It seems of gotten worse his caregiver states when he was in the hospital recently they really did not offloading. Fortunately I do not see any evidence of active infection locally or systemically at this time. 02-01-2022 upon evaluation today patient appears to be doing well currently in regard to his wounds. He is going require some sharp debridement of clearway some of the necrotic debris. Fortunately I do not see any evidence of active infection locally or systemically which is great  news. No fevers, chills, nausea, vomiting, or diarrhea. 02-22-2022 upon evaluation today patient appears to be doing poorly in regard to his right heel the left heel is completely closed. Unfortunately I think he is got some deep tissue injury to this area I am going to do a culture at this spot. With that being said he is having 2 other significant issues in regard to the left thigh this is showing signs of cellulitis all the way down into the thigh and was seeing definite temperature difference in the left thigh versus  the right thigh when feeling. It also is of note that the patient is also having some issues here with trouble in regard to having fevers his wife has been giving him Tylenol and ibuprofen to help keep the fevers under control but he has been as high as 102 this is pretty much been going on since Monday or Tuesday of this week. Nonetheless I am concerned I am not exactly sure where the cellulitis on the left thigh is coming from not sure if this is emanating from his sacral region that is a possibility or if there is something completely different going on either way with the fevers I am more worried that he needs to go get this checked out as soon as possible. 03-08-2022 upon evaluation today patient appears to be doing well with regard to his heel as well as sacral region. Both are showing signs of significant improvement compared to the last time I saw him. Overall I think we are in a much better spot 04-04-2022 upon evaluation today patient appears to be doing about the same in regard to his wounds. I do not see any signs of improvement nor worsening at this point. Fortunately there is no evidence of active infection at this time. He is not staying off of this is much as he needs to in fact is not even sleeping in the hospital bed with the alternating air mattress. 04-16-2022 upon evaluation today patient appears to be doing well currently in regard to his wound. He has been  tolerating the dressing changes without complication. Fortunately I do not see any evidence of infection. The heel actually looks quite well the sacral area actually is still quite deep. From my questioning and discussion with him today I do not believe that he is really staying off of this as much as he should. He seemed a little uncomfortable during that portion of the conversation today. I think that is contributing quite a bit to his lack of progress with regard to healing. 11/7; patient with decubitus ulcers on his right heel as well as his lower sacrum. We have been using Hydrofera Blue. He has home health predominantly for Foley catheter management. There have been some issues getting the Hydrofera Blue through home health but apparently the patient's wife has some supplies and is managing to get these changed.They come every 3 weeks because of transportation difficulties 05-28-2022 upon evaluation today patient appears to be doing decently well currently in regard to his wounds. He still has a depth to the wound in the sacral region but again this does seem to be a little bit less than previous. He has been using Hydrofera Blue both on this in the heel ulcer. Both are going to require some cleaning today sharp debridement on the heel I think is cleaning with saline gauze and the sterile Q-tip is probably sufficient for the sacral area which does not appear to have too much necrotic tissue. 06-18-2022 upon evaluation today patient's wounds are showing signs of doing decently well. Fortunately I do not see any signs of active infection locally nor systemically at this time which is great news. No fevers, chills, nausea, vomiting, or diarrhea. Objective Constitutional Well-nourished and well-hydrated in no acute distress. Vitals Time Taken: 1:23 PM, Height: 66 in, Weight: 279 lbs, BMI: 45, Temperature: 97.6 F, Pulse: 78 bpm, Respiratory Rate: 18 breaths/min, Blood Pressure: 171/93  mmHg. Respiratory normal breathing without difficulty. Psychiatric this patient is able to make decisions and demonstrates good insight  into disease process. Alert and Oriented x 3. pleasant and cooperative. General Notes: Upon inspection patient's wounds are showing signs of good granulation and I do not see any new deep tissue injuries or pressure injuries in general which is great news and overall I am extremely pleased in that regard. Fortunately there is no evidence of infection locally nor systemically at this time which is also great news. Integumentary (Hair, Skin) Wound #5 status is Open. Original cause of wound was Pressure Injury. The date acquired was: 07/01/2021. The wound has been in treatment 29 weeks. The wound is located on the Right Calcaneus. The wound measures 3.7cm length x 2cm width x 0.2cm depth; 5.812cm^2 area and 1.162cm^3 volume. There is Fat Layer (Subcutaneous Tissue) exposed. There is no tunneling or undermining noted. There is a medium amount of serosanguineous drainage noted. There is medium (34-66%) red, pink granulation within the wound bed. There is a medium (34-66%) amount of necrotic tissue within the wound bed including Adherent Slough. Wound #7 status is Open. Original cause of wound was Pressure Injury. The date acquired was: 07/01/2021. The wound has been in treatment 29 weeks. The wound is located on the Sacrum. The wound measures 3cm length x 1cm width x 4cm depth; 2.356cm^2 area and 9.425cm^3 volume. There is Fat Layer (Subcutaneous Tissue) exposed. There is a medium amount of serosanguineous drainage noted. There is large (67-100%) red granulation within the wound bed. There is no necrotic tissue within the wound bed. Assessment EHREN, BERISHA ([id number]) [id number] Page 8 of 9 Active Problems ICD-10 Pressure ulcer of right heel, stage 3 Pressure ulcer of left heel, stage 3 Pressure ulcer of sacral region, stage  4 Irritant contact dermatitis due to friction or contact with body fluids, unspecified Non-pressure chronic ulcer of buttock with fat layer exposed Muscle weakness (generalized) Bipolar disorder, unspecified Type 2 diabetes mellitus with other skin ulcer Essential (primary) hypertension Long term (current) use of anticoagulants Personal history of traumatic brain injury Plan Follow-up Appointments: Return Appointment in 3 weeks. Home Health: [facility] for wound care. May utilize formulary equivalent dressing for wound treatment orders unless otherwise specified. Home Health Nurse may visit PRN to address patientoos wound care needs. Lajean Manes [phone] Scheduled days for dressing changes to be completed; exception, patient has scheduled wound care visit that day. **Please direct any NON-WOUND related issues/requests for orders to patient's Primary Care Physician. **If current dressing causes regression in wound condition, may D/C ordered dressing product/s and apply Normal Saline Moist Dressing daily until next [place] or Other MD appointment. **Notify Wound Healing Center of regression in wound condition at [phone]. Bathing/ Shower/ Hygiene: No tub bath. Anesthetic (Use 'Patient Medications' Section for Anesthetic Order Entry): Lidocaine applied to wound bed Edema Control - Lymphedema / Segmental Compressive Device / Other: Elevate, Exercise Daily and Avoid Standing for Long Periods of Time. Elevate legs to the level of the heart and pump ankles as often as possible Elevate leg(s) parallel to the floor when sitting. DO YOUR BEST to sleep in the bed at night. DO NOT sleep in your recliner. Long hours of sitting in a recliner leads to swelling of the legs and/or potential wounds on your backside. Off-Loading: Gel wheelchair cushion Low air-loss mattress (Group 2) - Sleep in bed every night. Turn and reposition every 2 hours - keep pressure off of the  sacrum and heels wounds Other: - PRAFO boot in bed keep pressure off of sacrum/gluteus and heels- Additional Orders / Instructions: Follow  Nutritious Diet and Increase Protein Intake WOUND #5: - Calcaneus Wound Laterality: Right Prim Dressing: Hydrofera Blue Ready Transfer Foam, 2.5x2.5 (in/in) 3 x Per Week/30 Days ary Discharge Instructions: Apply Hydrofera Blue Ready to wound bed as directed Secondary Dressing: (BORDER) Zetuvit Plus SILICONE BORDER Dressing 4x4 (in/in) 3 x Per Week/30 Days Discharge Instructions: Please do not put silicone bordered dressings under wraps. Use non-bordered dressing only. WOUND #7: - Sacrum Wound Laterality: Topical: calmoseptine 1 x Per Day/30 Days Discharge Instructions: apply to excorated areas Prim Dressing: Hydrofera Blue Ready Transfer Foam, 4x5 (in/in) 1 x Per Day/30 Days ary Discharge Instructions: spiral hydrofera blue to lightly pack into wound, then cut to cover wound Secondary Dressing: ABD Pad 5x9 (in/in) 1 x Per Day/30 Days Discharge Instructions: Cover with ABD pad Secured With: Medipore T - 28M Medipore H Soft Cloth Surgical T ape ape, 2x2 (in/yd) 1 x Per Day/30 Days 1. Based on what I am seeing I would recommend that we have the patient continue to monitor for any signs of infection or worsening. Based on what I am seeing I think that he is actually doing quite well currently. This is just going to take some time to heal. 2. I am also can recommend that we have the patient continue to utilize the ABD pad to cover after applying the [facility] and in general I think you coupled with appropriate offloading this is good to be the right way to go. We will see patient back for reevaluation in 3 weeks here in the clinic. If anything worsens or changes patient will contact our office for additional recommendations. Electronic Signature(s) Signed: 06/18/2022 1:47:33 PM By: [name] PA-C Entered By: [name] on 06/18/2022  13:47:33 Pardoe, [name] ([id number]) [id number] Page 9 of 9 -------------------------------------------------------------------------------- SuperBill Details Patient Name: Date of Service: [name] [facility] 06/18/2022 Medical Record Number: [id number] Patient Account Number: [id number] Date of Birth/Sex: Treating RN: October 29, 1955 (66 y.o. [name]) [name] Primary Care Provider: [name] Other Clinician: Referring Provider: Treating Provider/Extender: [name]: 29 Diagnosis Coding ICD-10 Codes Code Description [phone] Pressure ulcer of right heel, stage 3 L89.623 Pressure ulcer of left heel, stage 3 L89.154 Pressure ulcer of sacral region, stage 4 L24.A0 Irritant contact dermatitis due to friction or contact with body fluids, unspecified L98.412 Non-pressure chronic ulcer of buttock with fat layer exposed M62.81 Muscle weakness (generalized) F31.9 Bipolar disorder, unspecified E11.622 Type 2 diabetes mellitus with other skin ulcer I10 Essential (primary) hypertension Z79.01 Long term (current) use of anticoagulants 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 Facility Procedures : CPT4 Code: [id number] Description: 99213 - WOUND CARE VISIT-LEV 3 EST PT Modifier: Quantity: 1 Physician Procedures : CPT4 Code Description Modifier [id number] 99213 - WC PHYS LEVEL 3 - EST PT ICD-10 Diagnosis Description L89.613 Pressure ulcer of right heel, stage 3 L89.623 Pressure ulcer of left heel, stage 3 L89.154 Pressure ulcer of sacral region, stage 4 L24.A0  Irritant contact dermatitis due to friction or contact with body fluids, unspecified Quantity: 1 Electronic Signature(s) Signed: 06/18/2022 1:48:34 PM By: [name] PA-C Entered By: [name] on 06/18/2022 13:48:34

## 2022-06-19 NOTE — Progress Notes (Signed)
[name], [name] ([id number]) [id number] Page 1 of 10 Visit Report for 06/18/2022 Arrival Information Details Patient Name: Date [facility]: [name]15 [facility] Medical Record Number: [id number] Patient Account Number: [id number] Date of Birth/Sex: Treating RN: 21-Jun-1956 (66 y.o. Judie PetitM) Yevonne PaxEpps, Carrie Primary Care Khi Mcmillen: Dione BoozePartridge, James Other Clinician: Referring Jeno Calleros: Treating Mileena Rothenberger/Extender: Donata DuffStone, Hoyt Partridge, James Weeks in Treatment: 29 Visit Information History Since Last Visit Added or deleted any medications: No Patient Arrived: Wheel Chair Any new allergies or adverse reactions: No Arrival Time: 13:19 Had a fall or experienced change in No Accompanied By: wife activities of daily living that may affect Transfer Assistance: None risk of falls: Patient Identification Verified: Yes Signs or symptoms of abuse/neglect since last visito No Secondary Verification Process Completed: Yes Hospitalized since last visit: No Patient Requires Transmission-Based Precautions: No Implantable device outside of the clinic excluding No Patient Has Alerts: Yes cellular tissue based products placed in the center Patient Alerts: Patient on Blood Thinner since last visit: Has Dressing in Place as Prescribed: Yes Pain Present Now: No Electronic Signature(s) Signed: 06/19/2022 4:14:17 PM By: Yevonne PaxEpps, Carrie RN Entered By: Yevonne PaxEpps, Carrie on 06/18/2022 13:23:37 -------------------------------------------------------------------------------- Clinic Level of Care Assessment Details Patient Name: Date of Service: [name], [name] 1:15 PM Medical Record Number: [id number] Patient Account Number: [id number] Date of Birth/Sex: Treating RN: 21-Jun-1956 (66 y.o. Judie PetitM) Yevonne PaxEpps, Carrie Primary Care Kyleigh Nannini: Dione BoozePartridge, James Other Clinician: Referring Zolton Dowson: Treating Zana Biancardi/Extender: Donata DuffStone, Hoyt Partridge, James Weeks in  Treatment: 29 Clinic Level of Care Assessment Items TOOL 4 Quantity Score X- 1 0 Use when only an EandM is performed on FOLLOW-UP visit ASSESSMENTS - Nursing Assessment / Reassessment X- 1 10 Reassessment of Co-morbidities (includes updates in patient status) X- 1 5 Reassessment of Adherence to Treatment Plan [name], [name] ([id number]) [id number] Page 2 of 10 ASSESSMENTS - Wound and Skin A ssessment / Reassessment []  - Simple Wound Assessment / Reassessment - one wound 0 X- 2 5 Complex Wound Assessment / Reassessment - multiple wounds []  - 0 Dermatologic / Skin Assessment (not related to wound area) ASSESSMENTS - Focused Assessment []  - 0 Circumferential Edema Measurements - multi extremities []  - 0 Nutritional Assessment / Counseling / Intervention []  - 0 Lower Extremity Assessment (monofilament, tuning fork, pulses) []  - 0 Peripheral Arterial Disease Assessment (using hand held doppler) ASSESSMENTS - Ostomy and/or Continence Assessment and Care []  - 0 Incontinence Assessment and Management []  - 0 Ostomy Care Assessment and Management (repouching, etc.) PROCESS - Coordination of Care X - Simple Patient / Family Education for ongoing care 1 15 []  - 0 Complex (extensive) Patient / Family Education for ongoing care []  - 0 Staff obtains ChiropractorConsents, Records, T Results / Process Orders est []  - 0 Staff telephones HHA, Nursing Homes / Clarify orders / etc []  - 0 Routine Transfer to another Facility (non-emergent condition) []  - 0 Routine Hospital Admission (non-emergent condition) []  - 0 New Admissions / Manufacturing engineernsurance Authorizations / Ordering NPWT Apligraf, etc. , []  - 0 Emergency Hospital Admission (emergent condition) X- 1 10 Simple Discharge Coordination []  - 0 Complex (extensive) Discharge Coordination PROCESS - Special Needs []  - 0 Pediatric / Minor Patient Management []  - 0 Isolation Patient Management []  - 0 Hearing / Language  / Visual special needs []  - 0 Assessment of Community assistance (transportation, D/C planning, etc.) []  - 0 Additional assistance / Altered mentation []  - 0 Support Surface(s) Assessment (bed, cushion, seat, etc.) INTERVENTIONS - Wound Cleansing / Measurement []  -  0 Simple Wound Cleansing - one wound X- 2 5 Complex Wound Cleansing - multiple wounds X- 1 5 Wound Imaging (photographs - any number of wounds)  - 0 Wound Tracing (instead of photographs)  - 0 Simple Wound Measurement - one wound X- 2 5 Complex Wound Measurement - multiple wounds INTERVENTIONS - Wound Dressings X - Small Wound Dressing one or multiple wounds 2 10  - 0 Medium Wound Dressing one or multiple wounds  - 0 Large Wound Dressing one or multiple wounds  - 0 Application of Medications - topical  - 0 Application of Medications - injection INTERVENTIONS - Miscellaneous  - 0 External ear exam Mcneece, [name] ([id number]) [id number] Page 3 of 10  - 0 Specimen Collection (cultures, biopsies, blood, body fluids, etc.)  - 0 Specimen(s) / Culture(s) sent or taken to Lab for analysis  - 0 Patient Transfer (multiple staff / Michiel Sites Lift / Similar devices)  - 0 Simple Staple / Suture removal (25 or less)  - 0 Complex Staple / Suture removal (26 or more)  - 0 Hypo / Hyperglycemic Management (close monitor of Blood Glucose)  - 0 Ankle / Brachial Index (ABI) - do not check if billed separately X- 1 5 Vital Signs Has the patient been seen at the hospital within the last three years: Yes Total Score: 100 Level Of Care: New/Established - Level 3 Electronic Signature(s) Signed: 06/19/2022 4:14:17 PM By: [name] RN Entered By: [name] on 06/18/2022 13:38:20 -------------------------------------------------------------------------------- Encounter Discharge Information Details Patient Name: Date of Service: [name] [facility] 06/18/2022  1:15 PM Medical Record Number: [id number] Patient Account Number: [id number] Date of Birth/Sex: Treating RN: 08-Feb-1956 (66 y.o. [name] Primary Care Marye Eagen: Dione Booze Other Clinician: Referring Krystle Oberman: Treating Ala Kratz/Extender: [name] in Treatment: 29 Encounter Discharge Information Items Discharge Condition: Stable Ambulatory Status: Wheelchair Discharge Destination: Home Transportation: Private Auto Accompanied By: wife Schedule Follow-up Appointment: Yes Clinical Summary of Care: Electronic Signature(s) Signed: 06/19/2022 4:14:17 PM By: [name] RN Entered By: [name] on 06/18/2022 13:39:17 -------------------------------------------------------------------------------- Lower Extremity Assessment Details Patient Name: Date of Service: [name] Dawley [facility] 06/18/2022 1:15 PM Lagman, [name] ([id number]) [id number] Page 4 of 10 Medical Record Number: [id number] Patient Account Number: [id number] Date of Birth/Sex: Treating RN: 07-30-1955 (66 y.o. [name]) [name] Primary Care Dredyn Gubbels: Dione Booze Other Clinician: Referring Srihitha Tagliaferri: Treating Adolpho Meenach/Extender: [name] in Treatment: 29 Electronic Signature(s) Signed: 06/19/2022 4:14:17 PM By: [name] RN Entered By: [name] on 06/18/2022 13:32:41 -------------------------------------------------------------------------------- Multi Wound Chart Details Patient Name: Date of Service: [name] Dawley [facility] 06/18/2022 1:15 PM Medical Record Number: [id number] Patient Account Number: [id number] Date of Birth/Sex: Treating RN: 1955-10-19 (66 y.o. [name]) [name] Primary Care Deran Barro: Dione Booze Other Clinician: Referring Mykenzi Vanzile: Treating Kaytlynn Kochan/Extender: [name] in Treatment: 29 Vital Signs Height(in): 66 Pulse(bpm): 78 Weight(lbs): 279 Blood  Pressure(mmHg): 171/93 Body Mass Index(BMI): 45 Temperature(F): 97.6 Respiratory Rate(breaths/min): 18 [5:Photos:] [N/A:N/A] Right Calcaneus Sacrum N/A Wound Location: Pressure Injury Pressure Injury N/A Wounding Event: Pressure Ulcer Pressure Ulcer N/A Primary Etiology: Anemia, Sleep Apnea, Coronary Artery Anemia, Sleep Apnea, Coronary Artery N/A Comorbid History: Disease, Deep Vein Thrombosis, Disease, Deep Vein Thrombosis, Hypertension, Type II Diabetes, Hypertension, Type II Diabetes, History of pressure wounds, History of pressure wounds, Neuropathy Neuropathy 07/01/2021 07/01/2021 N/A Date Acquired: 29 29 N/A Weeks of Treatment: Open Open N/A Wound Status: No No N/A Wound Recurrence: 3.7x2x0.2 3x1x4 N/A Measurements L x W x D (  cm) 5.812 2.356 N/A A (cm) : rea 1.162 9.425 N/A Volume (cm) : -40.40% 25.00% N/A % Reduction in A rea: -180.70% 0.00% N/A % Reduction in Volume: Category/Stage III Category/Stage IV N/A Classification: Medium Medium N/A Exudate A mount: Serosanguineous Serosanguineous N/A Exudate Type: red, brown red, brown N/A Exudate Color: Medium (34-66%) Large (67-100%) N/A Granulation A mount: Red, Pink Red N/A Granulation Quality: Medium (34-66%) None Present (0%) N/A Necrotic A mount: Fat Layer (Subcutaneous Tissue): Yes Fat Layer (Subcutaneous Tissue): Yes N/A Exposed Structures: Fascia: No Fascia: No [place], [name] ([id number]) [id number] Page 5 of 10 Tendon: No Tendon: No Muscle: No Muscle: No Joint: No Joint: No Bone: No Bone: No None None N/A Epithelialization: Treatment Notes Electronic Signature(s) Signed: 06/19/2022 4:14:17 PM By: [name] RN Entered By: [name] on 06/18/2022 13:32:45 -------------------------------------------------------------------------------- Multi-Disciplinary Care Plan Details Patient Name: Date of Service: [name] [facility] 06/18/2022 1:15  PM Medical Record Number: [id number] Patient Account Number: [id number] Date of Birth/Sex: Treating RN: 02/19/1956 (66 y.o. [name]) [name] Primary Care Lawrencia Mauney: Dione Booze Other Clinician: Referring Jackalynn Art: Treating Spiros Greenfeld/Extender: [name] in Treatment: 29 Active Inactive Wound/Skin Impairment Nursing Diagnoses: Knowledge deficit related to ulceration/compromised skin integrity Goals: Patient/caregiver will verbalize understanding of skin care regimen Date Initiated: 11/22/2021 Target Resolution Date: 05/25/2022 Goal Status: Active Ulcer/skin breakdown will have a volume reduction of 30% by week 4 Date Initiated: 11/22/2021 Date Inactivated: 01/10/2022 Target Resolution Date: 12/23/2021 Goal Status: Unmet Unmet Reason: comorbities Ulcer/skin breakdown will have a volume reduction of 50% by week 8 Date Initiated: 11/22/2021 Date Inactivated: 05/07/2022 Target Resolution Date: 01/22/2022 Goal Status: Unmet Unmet Reason: comorbidities Ulcer/skin breakdown will have a volume reduction of 80% by week 12 Date Initiated: 11/22/2021 Date Inactivated: 05/07/2022 Target Resolution Date: 02/22/2022 Goal Status: Unmet Unmet Reason: comorbidities Ulcer/skin breakdown will heal within 14 weeks Date Initiated: 11/22/2021 Date Inactivated: 05/07/2022 Target Resolution Date: 03/25/2022 Goal Status: Unmet Unmet Reason: comorbidities Interventions: Assess patient/caregiver ability to obtain necessary supplies Assess patient/caregiver ability to perform ulcer/skin care regimen upon admission and as needed Assess ulceration(s) every visit Notes: Electronic Signature(s) Signed: 06/19/2022 4:14:17 PM By: [name] RN Entered By: [name] on 06/18/2022 13:38:45 Girdner, [name] ([id number]) [id number] Page 6 of 10 -------------------------------------------------------------------------------- Pain Assessment  Details Patient Name: Date of Service: [name] Dawley [facility] 06/18/2022 1:15 PM Medical Record Number: [id number] Patient Account Number: [id number] Date of Birth/Sex: Treating RN: [birth year] (66 y.o. [name]) [name] Primary Care Macintyre Alexa: Dione Booze Other Clinician: Referring Nafisa Olds: Treating Hannie Shoe/Extender: [name] in Treatment: 29 Active Problems Location of Pain Severity and Description of Pain Patient Has Paino No Site Locations Pain Management and Medication Current Pain Management: Electronic Signature(s) Signed: 06/19/2022 4:14:17 PM By: [name] RN Entered By: [name] on 06/18/2022 13:25:04 -------------------------------------------------------------------------------- Patient/Caregiver Education Details Patient Name: Date of Service: [name] Dawley [facility] 12/19/2023andnbsp1:15 PM Medical Record Number: [id number] Patient Account Number: [id number] Date of Birth/Gender: Treating RN: 1956/04/13 (66 y.o. [name]) [name] Primary Care Physician: Dione Booze Other Clinician: Referring Physician: Treating Physician/Extender: [name] in Treatment: [address], [place] ([id number]) [id number] Page 7 of 10 Education Assessment Education Provided To: Patient Education Topics Provided Wound/Skin Impairment: Methods: Explain/Verbal Responses: State content correctly Electronic Signature(s) Signed: 06/19/2022 4:14:17 PM By: [name] RN Entered By: [name] on 06/18/2022 13:38:41 -------------------------------------------------------------------------------- Wound Assessment Details Patient Name: Date of Service: [name] Dawley [facility] 06/18/2022 1:15 PM Medical Record Number: [id number] Patient Account Number: [id number]  Date of Birth/Sex: Treating RN: 11-29-1955 (66 y.o. [name]) [name] Primary Care Cheryle Dark: Dione Booze Other  Clinician: Referring Renia Mikelson: Treating Nicolemarie Wooley/Extender: [name] in Treatment: 29 Wound Status Wound Number: 5 Primary Pressure Ulcer Etiology: Wound Location: Right Calcaneus Wound Open Wounding Event: Pressure Injury Status: Date Acquired: 07/01/2021 Comorbid Anemia, Sleep Apnea, Coronary Artery Disease, Deep Vein Weeks Of Treatment: 29 History: Thrombosis, Hypertension, Type II Diabetes, History of pressure Clustered Wound: No wounds, Neuropathy Photos Wound Measurements Length: (cm) 3.7 Width: (cm) 2 Depth: (cm) 0.2 Area: (cm) 5.812 Volume: (cm) 1.162 % Reduction in Area: -40.4% % Reduction in Volume: -180.7% Epithelialization: None Tunneling: No Undermining: No Wound Description Classification: Category/Stage III Exudate Amount: Medium Exudate Type: Serosanguineous Exudate Color: red, brown [place], [name] ([id number]) Foul Odor After Cleansing: No Slough/Fibrino Yes [id number] Page 8 of 10 Wound Bed Granulation Amount: Medium (34-66%) Exposed Structure Granulation Quality: Red, Pink Fascia Exposed: No Necrotic Amount: Medium (34-66%) Fat Layer (Subcutaneous Tissue) Exposed: Yes Necrotic Quality: Adherent Slough Tendon Exposed: No Muscle Exposed: No Joint Exposed: No Bone Exposed: No Treatment Notes Wound #5 (Calcaneus) Wound Laterality: Right Cleanser Peri-Wound Care Topical Primary Dressing Hydrofera Blue Ready Transfer Foam, 2.5x2.5 (in/in) Discharge Instruction: Apply Hydrofera Blue Ready to wound bed as directed Secondary Dressing (BORDER) Zetuvit Plus SILICONE BORDER Dressing 4x4 (in/in) Discharge Instruction: Please do not put silicone bordered dressings under wraps. Use non-bordered dressing only. Secured With Compression Wrap Compression Stockings Facilities manager) Signed: 06/19/2022 4:14:17 PM By: [name] RN Entered By: [name] on 06/18/2022  13:32:15 -------------------------------------------------------------------------------- Wound Assessment Details Patient Name: Date of Service: [name] Dawley [facility] 06/18/2022 1:15 PM Medical Record Number: [id number] Patient Account Number: [id number] Date of Birth/Sex: Treating RN: 09/30/1955 (66 y.o. [name]) [name] Primary Care Saima Monterroso: Dione Booze Other Clinician: Referring Kessler Kopinski: Treating Delcenia Inman/Extender: [name] in Treatment: 29 Wound Status Wound Number: 7 Primary Pressure Ulcer Etiology: Wound Location: Sacrum Wound Open Wounding Event: Pressure Injury Status: Date Acquired: 07/01/2021 Comorbid Anemia, Sleep Apnea, Coronary Artery Disease, Deep Vein Weeks Of Treatment: 29 History: Thrombosis, Hypertension, Type II Diabetes, History of pressure Clustered Wound: No wounds, Neuropathy Photos KASTEN, LEVEQUE ([id number]) [id number] Page 9 of 10 Wound Measurements Length: (cm) 3 Width: (cm) 1 Depth: (cm) 4 Area: (cm) 2.356 Volume: (cm) 9.425 % Reduction in Area: 25% % Reduction in Volume: 0% Epithelialization: None Wound Description Classification: Category/Stage IV Exudate Amount: Medium Exudate Type: Serosanguineous Exudate Color: red, brown Foul Odor After Cleansing: No Slough/Fibrino Yes Wound Bed Granulation Amount: Large (67-100%) Exposed Structure Granulation Quality: Red Fascia Exposed: No Necrotic Amount: None Present (0%) Fat Layer (Subcutaneous Tissue) Exposed: Yes Tendon Exposed: No Muscle Exposed: No Joint Exposed: No Bone Exposed: No Treatment Notes Wound #7 (Sacrum) Cleanser Peri-Wound Care Topical calmoseptine Discharge Instruction: apply to excorated areas Primary Dressing Hydrofera Blue Ready Transfer Foam, 4x5 (in/in) Discharge Instruction: spiral hydrofera blue to lightly pack into wound, then cut to cover wound Secondary Dressing ABD Pad 5x9  (in/in) Discharge Instruction: Cover with ABD pad Secured With Medipore T - 4M Medipore H Soft Cloth Surgical T ape ape, 2x2 (in/yd) Compression Wrap Compression Stockings Add-Ons Electronic Signature(s) Signed: 06/19/2022 4:14:17 PM By: [name] RN Entered By: [name] on 06/18/2022 13:32:29 [place], [name] ([id number]) [id number] Page 10 of 10 -------------------------------------------------------------------------------- Vitals Details Patient Name: Date of Service: [name] Dawley [facility] 06/18/2022 1:15 PM Medical Record Number: [id number] Patient Account Number: [id number] Date of Birth/Sex: Treating RN: Oct 25, 1955 (66 y.o. M) Epps,  Lyla Son Primary Care Deveion Denz: Dione Booze Other Clinician: Referring Tamaiya Bump: Treating Whittley Carandang/Extender: [name] in Treatment: 29 Vital Signs Time Taken: 13:23 Temperature (F): 97.6 Height (in): 66 Pulse (bpm): 78 Weight (lbs): 279 Respiratory Rate (breaths/min): 18 Body Mass Index (BMI): 45 Blood Pressure (mmHg): 171/93 Reference Range: 80 - 120 mg / dl Electronic Signature(s) Signed: 06/19/2022 4:14:17 PM By: [name] RN Entered By: [name] on 06/18/2022 13:24:58

## 2022-06-26 ENCOUNTER — Other Ambulatory Visit: Payer: Self-pay | Admitting: Psychiatry

## 2022-06-26 DIAGNOSIS — G4701 Insomnia due to medical condition: Secondary | ICD-10-CM

## 2022-06-26 DIAGNOSIS — F411 Generalized anxiety disorder: Secondary | ICD-10-CM

## 2022-06-26 DIAGNOSIS — F3178 Bipolar disorder, in full remission, most recent episode mixed: Secondary | ICD-10-CM

## 2022-07-09 ENCOUNTER — Ambulatory Visit: Payer: Medicare Other | Admitting: Physician Assistant

## 2022-07-15 ENCOUNTER — Encounter: Payer: Medicare Other | Attending: Physician Assistant | Admitting: Physician Assistant

## 2022-07-15 DIAGNOSIS — L24A0 Irritant contact dermatitis due to friction or contact with body fluids, unspecified: Secondary | ICD-10-CM | POA: Diagnosis not present

## 2022-07-15 DIAGNOSIS — I10 Essential (primary) hypertension: Secondary | ICD-10-CM | POA: Insufficient documentation

## 2022-07-15 DIAGNOSIS — Z86718 Personal history of other venous thrombosis and embolism: Secondary | ICD-10-CM | POA: Diagnosis not present

## 2022-07-15 DIAGNOSIS — L89623 Pressure ulcer of left heel, stage 3: Secondary | ICD-10-CM | POA: Insufficient documentation

## 2022-07-15 DIAGNOSIS — E11622 Type 2 diabetes mellitus with other skin ulcer: Secondary | ICD-10-CM | POA: Diagnosis present

## 2022-07-15 DIAGNOSIS — L89154 Pressure ulcer of sacral region, stage 4: Secondary | ICD-10-CM | POA: Insufficient documentation

## 2022-07-15 DIAGNOSIS — L98412 Non-pressure chronic ulcer of buttock with fat layer exposed: Secondary | ICD-10-CM | POA: Insufficient documentation

## 2022-07-15 DIAGNOSIS — E1140 Type 2 diabetes mellitus with diabetic neuropathy, unspecified: Secondary | ICD-10-CM | POA: Diagnosis not present

## 2022-07-15 DIAGNOSIS — L89613 Pressure ulcer of right heel, stage 3: Secondary | ICD-10-CM | POA: Insufficient documentation

## 2022-07-15 DIAGNOSIS — Z7901 Long term (current) use of anticoagulants: Secondary | ICD-10-CM | POA: Insufficient documentation

## 2022-07-15 NOTE — Progress Notes (Signed)
[name], [name] ([id number]) [id number] Page 1 of 9 Visit Report for 07/15/2022 Arrival Information Details Patient Name: Date of Service: [name] [facility] 07/15/2022 7:45 A M Medical Record Number: [id number] Patient Account Number: [id number] Date of Birth/Sex: Treating RN: Feb 19, 1956 (67 y.o. [name], [name] Primary Care Shatiqua Heroux: Dione Booze Other Clinician: Referring Aviah Sorci: Treating Chadley Dziedzic/Extender: [name] in Treatment: 33 Visit Information History Since Last Visit Added or deleted any medications: No Patient Arrived: Wheel Chair Has Dressing in Place as Prescribed: Yes Arrival Time: 08:08 Pain Present Now: No Accompanied By: caregiver Transfer Assistance: Manual Patient Identification Verified: Yes Secondary Verification Process Completed: Yes Patient Requires Transmission-Based Precautions: No Patient Has Alerts: Yes Patient Alerts: Patient on Blood Thinner Electronic Signature(s) Signed: 07/15/2022 6:16:56 PM By: Elliot Gurney, BSN, RN, CWS, Kim RN, BSN Entered By: Elliot Gurney, BSN, RN, CWS, Kim on 07/15/2022 08:15:02 -------------------------------------------------------------------------------- Encounter Discharge Information Details Patient Name: Date of Service: [name] [facility] 07/15/2022 7:45 A M Medical Record Number: [id number] Patient Account Number: [id number] Date of Birth/Sex: Treating RN: 07-15-1955 (67 y.o. [name] Primary Care Naphtali Zywicki: Dione Booze Other Clinician: Referring Lukah Goswami: Treating Sharayah Renfrow/Extender: [name] in Treatment: 33 Encounter Discharge Information Items Post Procedure Vitals Discharge Condition: Stable Temperature (F): 98.0 Ambulatory Status: Wheelchair Pulse (bpm): 90 Discharge Destination: Home Respiratory Rate (breaths/min): 16 Transportation: Private Auto Blood Pressure (mmHg): 130/74 Schedule Follow-up Appointment:  Yes Clinical Summary of Care: Electronic Signature(s) Signed: 07/15/2022 6:16:56 PM By: Elliot Gurney, BSN, RN, CWS, Kim RN, BSN [place], [place] ([id number]) [id number] Page 2 of 9 Entered By: Elliot Gurney, BSN, RN, CWS, Kim on 07/15/2022 08:52:26 -------------------------------------------------------------------------------- Lower Extremity Assessment Details Patient Name: Date of Service: [name] [facility] 07/15/2022 7:45 A M Medical Record Number: [id number] Patient Account Number: [id number] Date of Birth/Sex: Treating RN: 10/07/1955 (67 y.o. [name] Primary Care [name]: Dione Booze Other Clinician: Referring Jacory Kamel: Treating Janann Boeve/Extender: [name] in Treatment: 33 Edema Assessment Assessed: [name]: No] [name]: Yes] [Left: Edema] [Right: :] Vascular Assessment Pulses: Dorsalis Pedis Palpable: [Right:Yes] Electronic Signature(s) Signed: 07/15/2022 6:16:56 PM By: Elliot Gurney, BSN, RN, CWS, Kim RN, BSN Entered By: Elliot Gurney, BSN, RN, CWS, Kim on 07/15/2022 08:25:21 -------------------------------------------------------------------------------- Multi Wound Chart Details Patient Name: Date of Service: [name] [facility] 07/15/2022 7:45 A M Medical Record Number: [id number] Patient Account Number: [id number] Date of Birth/Sex: Treating RN: [birth year] (67 y.o. [name] Primary Care [name]: Dione Booze Other Clinician: Referring Ankur Snowdon: Treating [name]/Extender: [name] in Treatment: 33 Vital Signs Height(in): 66 Pulse(bpm): 90 Weight(lbs): 279 Blood Pressure(mmHg): 130/74 Body Mass Index(BMI): 45 Temperature(F): 98.0 Respiratory Rate(breaths/min): 16 [5:Photos:] [N/A:N/A [id number] Page 3 of 9] Right Calcaneus Sacrum N/A Wound Location: Pressure Injury Pressure Injury N/A Wounding Event: Pressure Ulcer Pressure Ulcer N/A Primary  Etiology: Anemia, Sleep Apnea, Coronary Artery Anemia, Sleep Apnea, Coronary Artery N/A Comorbid History: Disease, Deep Vein Thrombosis, Disease, Deep Vein Thrombosis, Hypertension, Type II Diabetes, Hypertension, Type II Diabetes, History of pressure wounds, History of pressure wounds, Neuropathy Neuropathy 07/01/2021 07/01/2021 N/A Date Acquired: 18 33 N/A Weeks of Treatment: Open Open N/A Wound Status: No No N/A Wound Recurrence: 3.7x2.3x0.4 3x2x4.5 N/A Measurements L x W x D (cm) 6.684 4.712 N/A A (cm) : rea 2.673 21.206 N/A Volume (cm) : -61.50% -50.00% N/A % Reduction in A rea: -545.70% -125.00% N/A % Reduction in Volume: Category/Stage III Category/Stage IV N/A Classification: Medium Medium N/A Exudate A mount: Serosanguineous Serosanguineous N/A Exudate Type: red, brown red,  brown N/A Exudate Color: Medium (34-66%) Large (67-100%) N/A Granulation A mount: Red, Pink Red N/A Granulation Quality: Medium (34-66%) None Present (0%) N/A Necrotic A mount: Fat Layer (Subcutaneous Tissue): Yes Fat Layer (Subcutaneous Tissue): Yes N/A Exposed Structures: Fascia: No Fascia: No Tendon: No Tendon: No Muscle: No Muscle: No Joint: No Joint: No Bone: No Bone: No None None N/A Epithelialization: Debridement - Excisional N/A N/A Debridement: Pre-procedure Verification/Time Out 08:30 N/A N/A Taken: Subcutaneous, Slough N/A N/A Tissue Debrided: Skin/Subcutaneous Tissue N/A N/A Level: 8.51 N/A N/A Debridement A (sq cm): rea Curette N/A N/A Instrument: Moderate N/A N/A Bleeding: Pressure N/A N/A Hemostasis A chieved: Procedure was tolerated well N/A N/A Debridement Treatment Response: 3.8x2.5x0.4 N/A N/A Post Debridement Measurements L x W x D (cm) 2.985 N/A N/A Post Debridement Volume: (cm) Category/Stage III N/A N/A Post Debridement Stage: Debridement N/A N/A Procedures Performed: Treatment Notes Electronic Signature(s) Signed: 07/15/2022 6:16:56  PM By: [name], BSN, RN, CWS, Kim RN, BSN Entered By: [name], BSN, RN, CWS, Kim on 07/15/2022 08:48:46 -------------------------------------------------------------------------------- Multi-Disciplinary Care Plan Details Patient Name: Date of Service: Jhonnie Garner [facility] 07/15/2022 7:45 A M Medical Record Number: [id number] Patient Account Number: [id number] Date of Birth/Sex: Treating RN: 05-Dec-1955 (67 y.o. [name] Primary Care Rodert Hinch: [name] Other Clinician: [name] ([id number]) [id number] Page 4 of 9 Referring Francis Doenges: Treating Demontrae Gilbert/Extender: [name]: 33 Active Inactive Wound/Skin Impairment Nursing Diagnoses: Knowledge deficit related to ulceration/compromised skin integrity Goals: Patient/caregiver will verbalize understanding of skin care regimen Date Initiated: 11/22/2021 Target Resolution Date: 05/25/2022 Goal Status: Active Ulcer/skin breakdown will have a volume reduction of 30% by week 4 Date Initiated: 11/22/2021 Date Inactivated: 01/10/2022 Target Resolution Date: 12/23/2021 Goal Status: Unmet Unmet Reason: comorbities Ulcer/skin breakdown will have a volume reduction of 50% by week 8 Date Initiated: 11/22/2021 Date Inactivated: 05/07/2022 Target Resolution Date: 01/22/2022 Goal Status: Unmet Unmet Reason: comorbidities Ulcer/skin breakdown will have a volume reduction of 80% by week 12 Date Initiated: 11/22/2021 Date Inactivated: 05/07/2022 Target Resolution Date: 02/22/2022 Goal Status: Unmet Unmet Reason: comorbidities Ulcer/skin breakdown will heal within 14 weeks Date Initiated: 11/22/2021 Date Inactivated: 05/07/2022 Target Resolution Date: 03/25/2022 Goal Status: Unmet Unmet Reason: comorbidities Interventions: Assess patient/caregiver ability to obtain necessary supplies Assess patient/caregiver ability to perform ulcer/skin care regimen upon admission and  as needed Assess ulceration(s) every visit Notes: Electronic Signature(s) Signed: 07/15/2022 6:16:56 PM By: [name], BSN, RN, CWS, Kim RN, BSN Entered By: [name], BSN, RN, CWS, Kim on 07/15/2022 08:51:33 -------------------------------------------------------------------------------- Pain Assessment Details Patient Name: Date of Service: Jhonnie Garner [facility] 07/15/2022 7:45 A M Medical Record Number: [id number] Patient Account Number: [id number] Date of Birth/Sex: Treating RN: 04/12/1956 (67 y.o. [name] Primary Care Ahaana Rochette: [name] Other Clinician: Referring Anuhea Gassner: Treating Marshall Roehrich/Extender: [name]: 33 Active Problems Location of Pain Severity and Description of Pain Patient Has Paino No Site Locations Brave, [place] ([id number]) [id number] Page 5 of 9 Pain Management and Medication Current Pain Management: Electronic Signature(s) Signed: 07/15/2022 6:16:56 PM By: [name], BSN, RN, CWS, Kim RN, BSN Entered By: [name], BSN, RN, CWS, Kim on 07/15/2022 08:67:61 -------------------------------------------------------------------------------- Patient/Caregiver Education Details Patient Name: Date of Service: Jhonnie Garner [facility] 1/15/2024andnbsp7:45 A M Medical Record Number: [id number] Patient Account Number: [id number] Date of Birth/Gender: Treating RN: [birth year] (67 y.o. [name] Primary Care Physician: [name] Other Clinician: Referring Physician: Treating Physician/Extender: [name]: 33 Education Assessment Education Provided To:  Patient Education Topics Provided Wound Debridement: Handouts: Wound Debridement Methods: Demonstration, Explain/Verbal Responses: State content correctly Electronic Signature(s) Signed: 07/15/2022 6:16:56 PM By: [name], BSN, RN, CWS, Kim RN, BSN Entered By: [name], BSN, RN, CWS, Kim on 07/15/2022  08:49:31 Thissen, [name] ([id number]) [id number] Page 6 of 9 -------------------------------------------------------------------------------- Wound Assessment Details Patient Name: Date of Service: Jhonnie Garner [facility] 07/15/2022 7:45 A M Medical Record Number: [id number] Patient Account Number: [id number] Date of Birth/Sex: Treating RN: Feb 25, 1956 (67 y.o. [name] Primary Care Keora Eccleston: [name] Other Clinician: Referring Kitrina Maurin: Treating Esly Selvage/Extender: [name]: 33 Wound Status Wound Number: 5 Primary Pressure Ulcer Etiology: Wound Location: Right Calcaneus Wound Open Wounding Event: Pressure Injury Status: Date Acquired: 07/01/2021 Comorbid Anemia, Sleep Apnea, Coronary Artery Disease, Deep Vein Weeks Of Treatment: 33 History: Thrombosis, Hypertension, Type II Diabetes, History of pressure Clustered Wound: No wounds, Neuropathy Photos Photo Uploaded By: [name], BSN, RN, CWS, Kim on 07/15/2022 08:30:03 Wound Measurements Length: (cm) 3.7 Width: (cm) 2.3 Depth: (cm) 0.4 Area: (cm) 6.684 Volume: (cm) 2.673 % Reduction in Area: -61.5% % Reduction in Volume: -545.7% Epithelialization: None Tunneling: No Undermining: No Wound Description Classification: Category/Stage III Exudate Amount: Medium Exudate Type: Serosanguineous Exudate Color: red, brown Foul Odor After Cleansing: No Slough/Fibrino Yes Wound Bed Granulation Amount: Medium (34-66%) Exposed Structure Granulation Quality: Red, Pink Fascia Exposed: No Necrotic Amount: Medium (34-66%) Fat Layer (Subcutaneous Tissue) Exposed: Yes Necrotic Quality: Adherent Slough Tendon Exposed: No Muscle Exposed: No Joint Exposed: No Bone Exposed: No Treatment Notes Wound #5 (Calcaneus) Wound Laterality: Right Cleanser Peri-Wound Care MALEKAI, MARKWOOD ([id number]) [id number] Page 7 of  9 Topical Primary Dressing Hydrofera Blue Ready Transfer Foam, 2.5x2.5 (in/in) Discharge Instruction: Apply Hydrofera Blue Ready to wound bed as directed Secondary Dressing (BORDER) Zetuvit Plus SILICONE BORDER Dressing 4x4 (in/in) Discharge Instruction: Please do not put silicone bordered dressings under wraps. Use non-bordered dressing only. Secured With Compression Wrap Compression Stockings Environmental education officer) Signed: 07/15/2022 6:16:56 PM By: [name], BSN, RN, CWS, Kim RN, BSN Entered By: [name], BSN, RN, CWS, Kim on 07/15/2022 08:20:06 -------------------------------------------------------------------------------- Wound Assessment Details Patient Name: Date of Service: Jhonnie Garner [facility] 07/15/2022 7:45 A M Medical Record Number: [id number] Patient Account Number: [id number] Date of Birth/Sex: Treating RN: 03-Sep-1955 (67 y.o. [name] Primary Care Heather Streeper: [name] Other Clinician: Referring Sencere Symonette: Treating Cher Egnor/Extender: [name]: 33 Wound Status Wound Number: 7 Primary Pressure Ulcer Etiology: Wound Location: Sacrum Wound Open Wounding Event: Pressure Injury Status: Date Acquired: 07/01/2021 Comorbid Anemia, Sleep Apnea, Coronary Artery Disease, Deep Vein Weeks Of Treatment: 33 History: Thrombosis, Hypertension, Type II Diabetes, History of pressure Clustered Wound: No wounds, Neuropathy Photos Wound Measurements Length: (cm) 3 Width: (cm) 2 Depth: (cm) 4.5 Area: (cm) 4.712 Volume: (cm) 21.206 % Reduction in Area: -50% % Reduction in Volume: -125% Epithelialization: None Tunneling: No Undermining: No Wound Description Colgan, Kekai ([id number]) Classification: Category/Stage IV Exudate Amount: Medium Exudate Type: Serosanguineous Exudate Color: red, brown [id number] Page 8 of 9 Foul Odor After Cleansing: No Slough/Fibrino Yes Wound  Bed Granulation Amount: Large (67-100%) Exposed Structure Granulation Quality: Red Fascia Exposed: No Necrotic Amount: None Present (0%) Fat Layer (Subcutaneous Tissue) Exposed: Yes Tendon Exposed: No Muscle Exposed: No Joint Exposed: No Bone Exposed: No Treatment Notes Wound #7 (Sacrum) Cleanser Peri-Wound Care Topical calmoseptine Discharge Instruction: apply to excorated areas Primary Dressing Hydrofera Blue Ready Transfer Foam, 4x5 (in/in) Discharge Instruction: spiral hydrofera blue to lightly pack into wound,  then cut to cover wound Secondary Dressing (BORDER) Zetuvit Plus SILICONE BORDER Dressing 4x4 (in/in) Discharge Instruction: Please do not put silicone bordered dressings under wraps. Use non-bordered dressing only. Secured With Compression Wrap Compression Stockings Environmental education officer) Signed: 07/15/2022 6:16:56 PM By: [name], BSN, RN, CWS, Kim RN, BSN Entered By: [name], BSN, RN, CWS, Kim on 07/15/2022 08:25:07 -------------------------------------------------------------------------------- Vitals Details Patient Name: Date of Service: Jhonnie Garner [facility] 07/15/2022 7:45 A M Medical Record Number: [id number] Patient Account Number: [id number] Date of Birth/Sex: Treating RN: 11-14-1955 (67 y.o. Isac Sarna, Maudie Mercury Primary Care Landers Prajapati: [name] Other Clinician: Referring Johanna Stafford: Treating Kanan Sobek/Extender: [name]: 33 Vital Signs Time Taken: 08:15 Temperature (F): 98.0 Height (in): 66 Pulse (bpm): 90 Weight (lbs): 279 Respiratory Rate (breaths/min): 16 Body Mass Index (BMI): 45 Blood Pressure (mmHg): 130/74 Reference Range: 80 - 120 mg / dl Scarfone, Embry ([id number]) [id number] Page 9 of 9 Electronic Signature(s) Signed: 07/15/2022 6:16:56 PM By: [name], BSN, RN, CWS, Kim RN, BSN Entered By: [name], BSN, RN, CWS, Kim on 07/15/2022 [id number]

## 2022-07-16 NOTE — Progress Notes (Signed)
HIMMAT, ENBERG ([id number]) [id number] Page 1 of 10 Visit Report for 07/15/2022 Chief Complaint Document Details Patient Name: Date of Service: [name] [facility] 07/15/2022 7:45 A M Medical Record Number: [id number] Patient Account Number: [id number] Date of Birth/Sex: Treating RN: 04-Sep-1955 (67 y.o. [name] Primary Care Provider: Lavone [name] Other Clinician: Referring Provider: Treating Provider/Extender: [name]: 33 Information Obtained from: Patient Chief Complaint Sacral, right gluteal, and bilateral heel ulcers Electronic Signature(s) Signed: 07/15/2022 8:40:53 AM By: [name] PA-C Entered By: [name] on 07/15/2022 08:40:52 -------------------------------------------------------------------------------- Debridement Details Patient Name: Date of Service: [name] [facility] 07/15/2022 7:45 A M Medical Record Number: [id number] Patient Account Number: [id number] Date of Birth/Sex: Treating RN: October 19, 1955 (67 y.o. [name] Primary Care Provider: Lavone [name] Other Clinician: Referring Provider: Treating Provider/Extender: [name]: 33 Debridement Performed for Assessment: Wound #5 Right Calcaneus Performed By: Physician Tommie Sams., PA-C Debridement Type: Debridement Level of Consciousness (Pre-procedure): Awake and Alert Pre-procedure Verification/Time Out Yes - 08:30 Taken: T Area Debrided (L x W): otal 3.7 (cm) x 2.3 (cm) = 8.51 (cm) Tissue and other material debrided: Viable, Non-Viable, Slough, Subcutaneous, Slough Level: Skin/Subcutaneous Tissue Debridement Description: Excisional Instrument: Curette Bleeding: Moderate Hemostasis Achieved: Pressure Response to Treatment: Procedure was tolerated well Level of Consciousness (Post- Awake and Alert procedure): Post Debridement Measurements of Total  Wound [name], [name] ([id number]) [id number] Page 2 of 10 Length: (cm) 3.8 Stage: Category/Stage III Width: (cm) 2.5 Depth: (cm) 0.4 Volume: (cm) 2.985 Character of Wound/Ulcer Post Debridement: Stable Post Procedure Diagnosis Same as Pre-procedure Electronic Signature(s) Signed: 07/15/2022 6:16:56 PM By: [name], BSN, RN, CWS, Kim RN, BSN Signed: 07/16/2022 5:09:00 PM By: [name] PA-C Entered By: [name], BSN, RN, CWS, Kim on 07/15/2022 08:33:18 -------------------------------------------------------------------------------- HPI Details Patient Name: Date of Service: [name] [facility] 07/15/2022 7:45 A M Medical Record Number: [id number] Patient Account Number: [id number] Date of Birth/Sex: Treating RN: 14-Dec-1955 (67 y.o. [name] Primary Care Provider: Lavone [name] Other Clinician: Referring Provider: Treating Provider/Extender: [name]: 33 History of Present Illness HPI Description: 05/29/2021 this is a patient who presents today for initial inspection here in the clinic concerning wounds that he has over the right heel and the sacral region. Unfortunately the sacral wound is starting to spread off to the right gluteal location due to how he sits always leaning towards the right side in his chair. His wife is present she is the primary caregiver though she is not home with him all the time she does have to work. She does do an excellent job however it appears to be in trying to keep things under good control for him. The patient is not able to change positions himself nor walk by himself so he is pretty much at the mercy of the position he is putting when she is gone and this tends to be his chair which she sits and most of the day. Obviously this I think is the main culprit for what is going on currently. It was actually in January 2020 when the sacral wound started. It was in  September 2022 when the wound started to spread more to the right gluteal location. Subsequently in August 2022 is when he had been in a skilled nursing facility and the heel started to give him trouble as well. That does not seem to be doing nearly as poorly as the sacral  region. He was hospitalized in October 2022 secondary to sepsis and this was in regard to the foot and was sent to skilled nursing again he is now back at home. He did have a wound VAC for the sacral wound over the summer 2022 but being in and out of facilities this ended up getting sent back. The patient does have [facility] home health that comes out 1 time per week to help with care. His most recent hemoglobin A1c was 6.9 in August 2022. 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 s. 07/24/2021 upon evaluation today patient appears to be doing decently well in regard to the pressure ulcer on the right heel as well as the sacral region. In general I think you are making some progress here which is great news. Overall the heel unfortunately had already closed previously when we saw him although it apparently reopened when he was working with physical therapy according to his wife. The area in the sacral region is doing well and looks clean there is still some depth here but I still think it would be difficult to wound VAC this region. His wife does an awesome job taking care of him. Is been so long since we have seen him because he has been in the hospital to be honest. 08/07/2021 upon evaluation today patient appears to be doing well at this time. Fortunately I do not see any signs of active infection locally or systemically at this time which is great news. No fevers, chills, nausea, vomiting, or diarrhea. Unfortunately after I saw  him on the 24th he actually ended up in the hospital in the 27th due to being septic. This was not due to the wounds but after looking at records actually due to a UTI. Fortunately he is doing much better and very happy in that regard. I do not see any signs of infection locally nor systemically at this time. 08/21/2021 upon evaluation today patient appears to be doing well with regard to his wound. Fortunately I think that the sacral region is doing decently well at this point which is great news. With regard to the foot this also does have some slough noted but I feel like we are making progress here. He does have some irritation around the right upper thigh/gluteal region. I feel like it is more towards the thigh. Nonetheless this does appear to be pressure related he spends a lot of time sitting up his caregiver states he really will not get in the bed and stay there like he should. 09/04/2021 upon evaluation patient appears to be doing decently well today in regard to the wound on his heel as well as the sacral area. I am actually very pleased with both and how things are appearing currently. There does not appear to be any evidence of active infection I think that his caregiver is doing an awesome job with regard to the wound care here. She is present today as well and I did discuss this with her. 09/25/2021 upon evaluation today patient appears to be doing well with regard to his wound on the sacral region. His right heel is also doing well. Unfortunately he has a new area on his left heel which does appear to be pressure injury. I do not see any evidence of active infection locally or systemically which is great  news no fevers, chills, nausea, vomiting, or diarrhea. Readmission: [name], [name] ([id number]) [id number] Page 3 of 10 11-22-2021 upon evaluation today patient presents for reevaluation here in the clinic concerning issues with his wounds. Since have  last seen him he was in the hospital and then subsequently ended up in a skilled nursing facility. Subsequently period of time in the facility he actually had a breakdown in the right thigh location which has been an issue for him. Fortunately I do not see any evidence of active infection locally or systemically which is great news and I am very pleased in that regard. Nonetheless he does have still the wounds on both heels as well as the wound in the sacral area and the new area in the right upper thigh/gluteal region. 12-13-2021 upon evaluation today patient appears to be doing well currently in regard to his wounds. The left heel is going to require some sharp debridement. Fortunately the right heel seems to be doing quite well. Overall I think his gluteal region as well as sacral region also are doing well. 01-10-2022 upon evaluation today patient appears to be doing better in regard to some areas unfortunately his sacral area is not doing as well. It seems of gotten worse his caregiver states when he was in the hospital recently they really did not offloading. Fortunately I do not see any evidence of active infection locally or systemically at this time. 02-01-2022 upon evaluation today patient appears to be doing well currently in regard to his wounds. He is going require some sharp debridement of clearway some of the necrotic debris. Fortunately I do not see any evidence of active infection locally or systemically which is great news. No fevers, chills, nausea, vomiting, or diarrhea. 02-22-2022 upon evaluation today patient appears to be doing poorly in regard to his right heel the left heel is completely closed. Unfortunately I think he is got some deep tissue injury to this area I am going to do a culture at this spot. With that being said he is having 2 other significant issues in regard to the left thigh this is showing signs of cellulitis all the way down into the thigh and was seeing definite  temperature difference in the left thigh versus the right thigh when feeling. It also is of note that the patient is also having some issues here with trouble in regard to having fevers his wife has been giving him Tylenol and ibuprofen to help keep the fevers under control but he has been as high as 102 this is pretty much been going on since Monday or Tuesday of this week. Nonetheless I am concerned I am not exactly sure where the cellulitis on the left thigh is coming from not sure if this is emanating from his sacral region that is a possibility or if there is something completely different going on either way with the fevers I am more worried that he needs to go get this checked out as soon as possible. 03-08-2022 upon evaluation today patient appears to be doing well with regard to his heel as well as sacral region. Both are showing signs of significant improvement compared to the last time I saw him. Overall I think we are in a much better spot 04-04-2022 upon evaluation today patient appears to be doing about the same in regard to his wounds. I do not see any signs of improvement nor worsening at this point. Fortunately there is no evidence of active infection at this time. He  is not staying off of this is much as he needs to in fact is not even sleeping in the hospital bed with the alternating air mattress. 04-16-2022 upon evaluation today patient appears to be doing well currently in regard to his wound. He has been tolerating the dressing changes without complication. Fortunately I do not see any evidence of infection. The heel actually looks quite well the sacral area actually is still quite deep. From my questioning and discussion with him today I do not believe that he is really staying off of this as much as he should. He seemed a little uncomfortable during that portion of the conversation today. I think that is contributing quite a bit to his lack of progress with regard to  healing. 11/7; patient with decubitus ulcers on his right heel as well as his lower sacrum. We have been using Hydrofera Blue. He has home health predominantly for Foley catheter management. There have been some issues getting the Hydrofera Blue through home health but apparently the patient's wife has some supplies and is managing to get these changed.They come every 3 weeks because of transportation difficulties 05-28-2022 upon evaluation today patient appears to be doing decently well currently in regard to his wounds. He still has a depth to the wound in the sacral region but again this does seem to be a little bit less than previous. He has been using Hydrofera Blue both on this in the heel ulcer. Both are going to require some cleaning today sharp debridement on the heel I think is cleaning with saline gauze and the sterile Q-tip is probably sufficient for the sacral area which does not appear to have too much necrotic tissue. 06-18-2022 upon evaluation today patient's wounds are showing signs of doing decently well. Fortunately I do not see any signs of active infection locally nor systemically at this time which is great news. No fevers, chills, nausea, vomiting, or diarrhea. 07-15-2022 upon evaluation today patient appears to be doing well currently in regard to his wound. He has been tolerating the dressing changes without complication. Fortunately I think that the heel is doing well except for where it is causing pressure to the heel posteriorly and this apparently is due to some kind of exercise machine that his children got him. Nonetheless I am not sure that that is going to be helpful especially if it is causing discomfort and pain. And especially if it is making the wound worse which is an even bigger deal. With regard to the patient's sacral region this is draining quite significantly. I do think a wound VAC would be ideal here and I would recommend that we go ahead and try to see about  getting this ordered for him as soon as possible. The patient's wife and the patient are in agreement with this plan. Electronic Signature(s) Signed: 07/15/2022 2:47:39 PM By: Lenda Kelp PA-C Entered By: Lenda Kelp on 07/15/2022 14:47:38 -------------------------------------------------------------------------------- Physical Exam Details Patient Name: Date of Service: [name] [facility] 07/15/2022 7:45 A M Medical Record Number: [id number] Patient Account Number: [id number] Date of Birth/Sex: Treating RN: Jun 10, 1956 (67 y.o. [name] Primary Care Provider: Dione Booze Other Clinician: Referring Provider: Treating Provider/Extender: [name] in Treatment: [address], [place] ([id number]) [id number] Page 4 of 10 Constitutional Well-nourished and well-hydrated in no acute distress. Respiratory normal breathing without difficulty. Psychiatric this patient is able to make decisions and demonstrates good insight into disease process. Alert and Oriented x 3. pleasant  and cooperative. Notes Upon inspection patient's wound bed actually showed signs of good granulation epithelization at this point. Fortunately I see no evidence of active infection locally nor systemically which is great news and overall I am extremely pleased with where we stand I did ask perform debridement in regard to the heel where he had some area of deep tissue injury more proximally in the posterior aspect. I also did have to debride the remainder of the wound as well to get some of the slough and biofilm off postdebridement this looks better. In regard to the sacral area this is still draining quite significantly. Electronic Signature(s) Signed: 07/15/2022 2:48:11 PM By: Lenda Kelp PA-C Entered By: Lenda Kelp on 07/15/2022 14:48:10 -------------------------------------------------------------------------------- Physician Orders  Details Patient Name: Date of Service: [name] [facility] 07/15/2022 7:45 A M Medical Record Number: [id number] Patient Account Number: [id number] Date of Birth/Sex: Treating RN: 25-Dec-1955 (67 y.o. [name] Primary Care Provider: Dione Booze Other Clinician: Referring Provider: Treating Provider/Extender: [name] in Treatment: 5 Verbal / Phone Orders: No Diagnosis Coding Follow-up Appointments Return Appointment in 3 weeks. Home Health [facility] for wound care. May utilize formulary equivalent dressing for wound treatment orders unless otherwise specified. Home Health Nurse may visit PRN to address patients wound care needs. Beverly Gust [phone] Scheduled days for dressing changes to be completed; exception, patient has scheduled wound care visit that day. **Please direct any NON-WOUND related issues/requests for orders to patient's Primary Care Physician. **If current dressing causes regression in wound condition, may D/C ordered dressing product/s and apply Normal Saline Moist Dressing daily until next Wound Healing Center or Other MD appointment. **Notify Wound Healing Center of regression in wound condition at [phone]. Bathing/ Shower/ Hygiene No tub bath. Anesthetic (Use 'Patient Medications' Section for Anesthetic Order Entry) Lidocaine applied to wound bed Edema Control - Lymphedema / Segmental Compressive Device / Other Elevate, Exercise Daily and A void Standing for Long Periods of Time. Elevate legs to the level of the heart and pump ankles as often as possible Elevate leg(s) parallel to the floor when sitting. DO YOUR BEST to sleep in the bed at night. DO NOT sleep in your recliner. Long hours of sitting in a recliner leads to swelling of the legs and/or potential wounds on your backside. Off-Loading Gel wheelchair cushion Low air-loss mattress (Group 2) - Sleep in bed every night. Turn and reposition every 2  hours - keep pressure off of the sacrum and heels wounds Other: - PRAFO boot in bed keep pressure off of sacrum/gluteus and heels- Additional Orders / Instructions Follow Nutritious Diet and Increase Protein Intake Allbritton, [name] ([id number]) [id number] Page 5 of 10 Negative Pressure Wound Therapy Wound #7 Sacrum Wound VAC settings at continuous pressure. Use foam to wound cavity. Please order WHITE foam to fill any tunnel/s and/or undermining when necessary. Change VAC dressing 2 X WEEK. Change canister as indicated when full. - [facility] Nurse may d/c VAC for s/s of increased infection, significant wound regression, or uncontrolled drainage. Notify Wound Healing Center at [phone]. Number of foam/gauze pieces used in the dressing = Wound Treatment Wound #5 - Calcaneus Wound Laterality: Right Prim Dressing: Hydrofera Blue Ready Transfer Foam, 2.5x2.5 (in/in) 3 x Per Week/30 Days ary Discharge Instructions: Apply Hydrofera Blue Ready to wound bed as directed Secondary Dressing: (BORDER) Zetuvit Plus SILICONE BORDER Dressing 4x4 (in/in) 3 x Per Week/30 Days Discharge Instructions: Please do not put silicone bordered dressings  under wraps. Use non-bordered dressing only. Wound #7 - Sacrum Topical: calmoseptine 1 x Per Day/30 Days Discharge Instructions: apply to excorated areas Prim Dressing: Hydrofera Blue Ready Transfer Foam, 4x5 (in/in) 1 x Per Day/30 Days ary Discharge Instructions: spiral hydrofera blue to lightly pack into wound, then cut to cover wound Secondary Dressing: (BORDER) Zetuvit Plus SILICONE BORDER Dressing 4x4 (in/in) 1 x Per Day/30 Days Discharge Instructions: Please do not put silicone bordered dressings under wraps. Use non-bordered dressing only. Electronic Signature(s) Signed: 07/15/2022 6:16:56 PM By: Elliot Gurney, BSN, RN, CWS, Kim RN, BSN Signed: 07/16/2022 5:09:00 PM By: Lenda Kelp PA-C Entered By: Elliot Gurney  BSN, RN, CWS, Kim on 07/15/2022 08:48:58 -------------------------------------------------------------------------------- Problem List Details Patient Name: Date of Service: [name] [facility] 07/15/2022 7:45 A M Medical Record Number: [id number] Patient Account Number: [id number] Date of Birth/Sex: Treating RN: 1955-09-02 (66 y.o. [name] Primary Care Provider: Dione Booze Other Clinician: Referring Provider: Treating Provider/Extender: [name] in Treatment: 33 Active Problems ICD-10 Encounter Code Description Active Date MDM Diagnosis L89.613 Pressure ulcer of right heel, stage 3 11/22/2021 No Yes L89.623 Pressure ulcer of left heel, stage 3 11/22/2021 No Yes L89.154 Pressure ulcer of sacral region, stage 4 11/22/2021 No Yes L24.A0 Irritant contact dermatitis due to friction or contact with body fluids, 11/22/2021 No Yes unspecified KANOA, PHILLIPPI ([id number]) [id number] Page 6 of 10 [phone] Non-pressure chronic ulcer of buttock with fat layer exposed 11/22/2021 No Yes M62.81 Muscle weakness (generalized) 11/22/2021 No Yes F31.9 Bipolar disorder, unspecified 11/22/2021 No Yes E11.622 Type 2 diabetes mellitus with other skin ulcer 11/22/2021 No Yes I10 Essential (primary) hypertension 11/22/2021 No Yes Z79.01 Long term (current) use of anticoagulants 11/22/2021 No Yes 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 11/22/2021 No Yes Inactive Problems Resolved Problems Electronic Signature(s) Signed: 07/15/2022 8:40:49 AM By: Lenda Kelp PA-C Entered By: Lenda Kelp on 07/15/2022 08:40:48 -------------------------------------------------------------------------------- Progress Note Details Patient Name: Date of Service: [name] [facility] 07/15/2022 7:45 A M Medical Record Number: [id number] Patient Account Number: [id number] Date of Birth/Sex: Treating RN: 1955-10-21 (67 y.o. [name] Primary Care Provider: Dione Booze Other Clinician: Referring Provider: Treating Provider/Extender: [name] in Treatment: 33 Subjective Chief Complaint Information obtained from Patient Sacral, right gluteal, and bilateral heel ulcers History of Present Illness (HPI) 05/29/2021 this is a patient who presents today for initial inspection here in the clinic concerning wounds that he has over the right heel and the sacral region. Unfortunately the sacral wound is starting to spread off to the right gluteal location due to how he sits always leaning towards the right side in his chair. His wife is present she is the primary caregiver though she is not home with him all the time she does have to work. She does do an excellent job however it appears to be in trying to keep things under good control for him. The patient is not able to change positions himself nor walk by himself so he is pretty much at the mercy of the position he is putting when she is gone and this tends to be his chair which she sits and most of the day. Obviously this I think is the main culprit for what is going on currently. OVIE, CORNELIO ([id number]) [id number] Page 7 of 10 It was actually in January 2020 when the sacral wound started. It was in September 2022 when the wound started to spread more to the right gluteal location.  Subsequently in August 2022 is when he had been in a skilled nursing facility and the heel started to give him trouble as well. That does not seem to be doing nearly as poorly as the sacral region. He was hospitalized in October 2022 secondary to sepsis and this was in regard to the foot and was sent to skilled nursing again he is now back at home. He did have a wound VAC for the sacral wound over the summer 2022 but being in and out of facilities this ended up getting sent back. The patient does have [facility] home health that comes  out 1 time per week to help with care. His most recent hemoglobin A1c was 6.9 in August 2022. 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 s. 07/24/2021 upon evaluation today patient appears to be doing decently well in regard to the pressure ulcer on the right heel as well as the sacral region. In general I think you are making some progress here which is great news. Overall the heel unfortunately had already closed previously when we saw him although it apparently reopened when he was working with physical therapy according to his wife. The area in the sacral region is doing well and looks clean there is still some depth here but I still think it would be difficult to wound VAC this region. His wife does an awesome job taking care of him. Is been so long since we have seen him because he has been in the hospital to be honest. 08/07/2021 upon evaluation today patient appears to be doing well at this time. Fortunately I do not see any signs of active infection locally or systemically at this time which is great news. No fevers, chills, nausea, vomiting, or diarrhea. Unfortunately after I saw him on the 24th he actually ended up in the hospital in the 27th due to being septic. This was not due to the wounds but after looking at records actually due to a UTI. Fortunately he is doing much better and very happy in that regard. I do not see any signs of infection locally nor systemically at this time. 08/21/2021 upon evaluation today patient appears to be doing well with regard to his wound. Fortunately I think that the sacral region is doing decently well at this point which is great news. With regard to the foot this also does have some slough noted but I feel like we are making progress here. He does have  some irritation around the right upper thigh/gluteal region. I feel like it is more towards the thigh. Nonetheless this does appear to be pressure related he spends a lot of time sitting up his caregiver states he really will not get in the bed and stay there like he should. 09/04/2021 upon evaluation patient appears to be doing decently well today in regard to the wound on his heel as well as the sacral area. I am actually very pleased with both and how things are appearing currently. There does not appear to be any evidence of active infection I think that his caregiver is doing an awesome job with regard to the wound care here. She is present today as well and I did discuss this with her. 09/25/2021 upon evaluation today patient appears to be doing well with regard to his wound on the sacral region. His  right heel is also doing well. Unfortunately he has a new area on his left heel which does appear to be pressure injury. I do not see any evidence of active infection locally or systemically which is great news no fevers, chills, nausea, vomiting, or diarrhea. Readmission: 11-22-2021 upon evaluation today patient presents for reevaluation here in the clinic concerning issues with his wounds. Since have last seen him he was in the hospital and then subsequently ended up in a skilled nursing facility. Subsequently period of time in the facility he actually had a breakdown in the right thigh location which has been an issue for him. Fortunately I do not see any evidence of active infection locally or systemically which is great news and I am very pleased in that regard. Nonetheless he does have still the wounds on both heels as well as the wound in the sacral area and the new area in the right upper thigh/gluteal region. 12-13-2021 upon evaluation today patient appears to be doing well currently in regard to his wounds. The left heel is going to require some sharp debridement. Fortunately the right heel  seems to be doing quite well. Overall I think his gluteal region as well as sacral region also are doing well. 01-10-2022 upon evaluation today patient appears to be doing better in regard to some areas unfortunately his sacral area is not doing as well. It seems of gotten worse his caregiver states when he was in the hospital recently they really did not offloading. Fortunately I do not see any evidence of active infection locally or systemically at this time. 02-01-2022 upon evaluation today patient appears to be doing well currently in regard to his wounds. He is going require some sharp debridement of clearway some of the necrotic debris. Fortunately I do not see any evidence of active infection locally or systemically which is great news. No fevers, chills, nausea, vomiting, or diarrhea. 02-22-2022 upon evaluation today patient appears to be doing poorly in regard to his right heel the left heel is completely closed. Unfortunately I think he is got some deep tissue injury to this area I am going to do a culture at this spot. With that being said he is having 2 other significant issues in regard to the left thigh this is showing signs of cellulitis all the way down into the thigh and was seeing definite temperature difference in the left thigh versus the right thigh when feeling. It also is of note that the patient is also having some issues here with trouble in regard to having fevers his wife has been giving him Tylenol and ibuprofen to help keep the fevers under control but he has been as high as 102 this is pretty much been going on since Monday or Tuesday of this week. Nonetheless I am concerned I am not exactly sure where the cellulitis on the left thigh is coming from not sure if this is emanating from his sacral region that is a possibility or if there is something completely different going on either way with the fevers I am more worried that he needs to go get this checked out as soon as  possible. 03-08-2022 upon evaluation today patient appears to be doing well with regard to his heel as well as sacral region. Both are showing signs of significant improvement compared to the last time I saw him. Overall I think we are in a much better spot 04-04-2022 upon evaluation today patient appears to be doing about the same  in regard to his wounds. I do not see any signs of improvement nor worsening at this point. Fortunately there is no evidence of active infection at this time. He is not staying off of this is much as he needs to in fact is not even sleeping in the hospital bed with the alternating air mattress. 04-16-2022 upon evaluation today patient appears to be doing well currently in regard to his wound. He has been tolerating the dressing changes without complication. Fortunately I do not see any evidence of infection. The heel actually looks quite well the sacral area actually is still quite deep. From my questioning and discussion with him today I do not believe that he is really staying off of this as much as he should. He seemed a little uncomfortable during that portion of the conversation today. I think that is contributing quite a bit to his lack of progress with regard to healing. 11/7; patient with decubitus ulcers on his right heel as well as his lower sacrum. We have been using Hydrofera Blue. He has home health predominantly for Foley catheter management. There have been some issues getting the Hydrofera Blue through home health but apparently the patient's wife has some supplies and is managing to get these changed.They come every 3 weeks because of transportation difficulties 05-28-2022 upon evaluation today patient appears to be doing decently well currently in regard to his wounds. He still has a depth to the wound in the sacral region but again this does seem to be a little bit less than previous. He has been using Hydrofera Blue both on this in the heel ulcer. Both are  going to require some cleaning today sharp debridement on the heel I think is cleaning with saline gauze and the sterile Q-tip is probably sufficient for the sacral area which does not appear to have too much necrotic tissue. 06-18-2022 upon evaluation today patient's wounds are showing signs of doing decently well. Fortunately I do not see any signs of active infection locally nor systemically at this time which is great news. No fevers, chills, nausea, vomiting, or diarrhea. 07-15-2022 upon evaluation today patient appears to be doing well currently in regard to his wound. He has been tolerating the dressing changes without complication. Fortunately I think that the heel is doing well except for where it is causing pressure to the heel posteriorly and this apparently is due to some kind of exercise machine that his children got him. Nonetheless I am not sure that that is going to be helpful especially if it is causing discomfort and pain. And especially if it is making the wound worse which is an even bigger deal. With regard to the patient's sacral region this is draining quite significantly. I do think a wound VAC would be ideal here and I would recommend that we go ahead and try to see about getting this ordered for him as soon as possible. The patient's [name], [name] ([id number]) [id number] Page 8 of 10 wife and the patient are in agreement with this plan. Objective Constitutional Well-nourished and well-hydrated in no acute distress. Vitals Time Taken: 8:15 AM, Height: 66 in, Weight: 279 lbs, BMI: 45, Temperature: 98.0 F, Pulse: 90 bpm, Respiratory Rate: 16 breaths/min, Blood Pressure: 130/74 mmHg. Respiratory normal breathing without difficulty. Psychiatric this patient is able to make decisions and demonstrates good insight into disease process. Alert and Oriented x 3. pleasant and cooperative. General Notes: Upon inspection patient's wound bed  actually showed signs of good granulation epithelization  at this point. Fortunately I see no evidence of active infection locally nor systemically which is great news and overall I am extremely pleased with where we stand I did ask perform debridement in regard to the heel where he had some area of deep tissue injury more proximally in the posterior aspect. I also did have to debride the remainder of the wound as well to get some of the slough and biofilm off postdebridement this looks better. In regard to the sacral area this is still draining quite significantly. Integumentary (Hair, Skin) Wound #5 status is Open. Original cause of wound was Pressure Injury. The date acquired was: 07/01/2021. The wound has been in treatment 33 weeks. The wound is located on the Right Calcaneus. The wound measures 3.7cm length x 2.3cm width x 0.4cm depth; 6.684cm^2 area and 2.673cm^3 volume. There is Fat Layer (Subcutaneous Tissue) exposed. There is no tunneling or undermining noted. There is a medium amount of serosanguineous drainage noted. There is medium (34-66%) red, pink granulation within the wound bed. There is a medium (34-66%) amount of necrotic tissue within the wound bed including Adherent Slough. Wound #7 status is Open. Original cause of wound was Pressure Injury. The date acquired was: 07/01/2021. The wound has been in treatment 33 weeks. The wound is located on the Sacrum. The wound measures 3cm length x 2cm width x 4.5cm depth; 4.712cm^2 area and 21.206cm^3 volume. There is Fat Layer (Subcutaneous Tissue) exposed. There is no tunneling or undermining noted. There is a medium amount of serosanguineous drainage noted. There is large (67- 100%) red granulation within the wound bed. There is no necrotic tissue within the wound bed. Assessment Active Problems ICD-10 Pressure ulcer of right heel, stage 3 Pressure ulcer of left heel, stage 3 Pressure ulcer of sacral region, stage 4 Irritant contact  dermatitis due to friction or contact with body fluids, unspecified Non-pressure chronic ulcer of buttock with fat layer exposed Muscle weakness (generalized) Bipolar disorder, unspecified Type 2 diabetes mellitus with other skin ulcer Essential (primary) hypertension Long term (current) use of anticoagulants Personal history of traumatic brain injury Procedures Wound #5 Pre-procedure diagnosis of Wound #5 is a Pressure Ulcer located on the Right Calcaneus . There was a Excisional Skin/Subcutaneous Tissue Debridement with a total area of 8.51 sq cm performed by Tommie Sams., PA-C. With the following instrument(s): Curette to remove Viable and Non-Viable tissue/material. Material removed includes Subcutaneous Tissue and Slough and. No specimens were taken. A time out was conducted at 08:30, prior to the start of the procedure. A Moderate amount of bleeding was controlled with Pressure. The procedure was tolerated well. Post Debridement Measurements: 3.8cm length x 2.5cm width x 0.4cm depth; 2.985cm^3 volume. Post debridement Stage noted as Category/Stage III. Character of Wound/Ulcer Post Debridement is stable. Post procedure Diagnosis Wound #5: Same as Pre-Procedure Plan Follow-up Appointments: [name], [name] ([id number]) [id number] Page 9 of 10 Return Appointment in 3 weeks. Home Health: [facility] for wound care. May utilize formulary equivalent dressing for wound treatment orders unless otherwise specified. Home Health Nurse may visit PRN to address patientoos wound care needs. Lajean Manes [phone] Scheduled days for dressing changes to be completed; exception, patient has scheduled wound care visit that day. **Please direct any NON-WOUND related issues/requests for orders to patient's Primary Care Physician. **If current dressing causes regression in wound condition, may D/C ordered dressing product/s and apply Normal Saline Moist  Dressing daily until next [place] or Other MD appointment. **[place]  of regression in wound condition at [phone]. Bathing/ Shower/ Hygiene: No tub bath. Anesthetic (Use 'Patient Medications' Section for Anesthetic Order Entry): Lidocaine applied to wound bed Edema Control - Lymphedema / Segmental Compressive Device / Other: Elevate, Exercise Daily and Avoid Standing for Long Periods of Time. Elevate legs to the level of the heart and pump ankles as often as possible Elevate leg(s) parallel to the floor when sitting. DO YOUR BEST to sleep in the bed at night. DO NOT sleep in your recliner. Long hours of sitting in a recliner leads to swelling of the legs and/or potential wounds on your backside. Off-Loading: Gel wheelchair cushion Low air-loss mattress (Group 2) - Sleep in bed every night. Turn and reposition every 2 hours - keep pressure off of the sacrum and heels wounds Other: - PRAFO boot in bed keep pressure off of sacrum/gluteus and heels- Additional Orders / Instructions: Follow Nutritious Diet and Increase Protein Intake Negative Pressure Wound Therapy: Wound #7 Sacrum: Wound VAC settings at 125mmHg continuous pressure. Use foam to wound cavity. Please order WHITE foam to fill any tunnel/s and/or undermining when necessary. Change VAC dressing 2 X WEEK. Change canister as indicated when full. - [facility] Nurse may d/c VAC for s/s of increased infection, significant wound regression, or uncontrolled drainage. Notify [facility] at [phone]. Number of foam/gauze pieces used in the dressing = WOUND #5: - Calcaneus Wound Laterality: Right Prim Dressing: Hydrofera Blue Ready Transfer Foam, 2.5x2.5 (in/in) 3 x Per Week/30 Days ary Discharge Instructions: Apply Hydrofera Blue Ready to wound bed as directed Secondary Dressing: (BORDER) Zetuvit Plus SILICONE BORDER Dressing 4x4 (in/in) 3 x Per Week/30 Days Discharge  Instructions: Please do not put silicone bordered dressings under wraps. Use non-bordered dressing only. WOUND #7: - Sacrum Wound Laterality: Topical: calmoseptine 1 x Per Day/30 Days Discharge Instructions: apply to excorated areas Prim Dressing: Hydrofera Blue Ready Transfer Foam, 4x5 (in/in) 1 x Per Day/30 Days ary Discharge Instructions: spiral hydrofera blue to lightly pack into wound, then cut to cover wound Secondary Dressing: (BORDER) Zetuvit Plus SILICONE BORDER Dressing 4x4 (in/in) 1 x Per Day/30 Days Discharge Instructions: Please do not put silicone bordered dressings under wraps. Use non-bordered dressing only. 1. I would recommend currently that we have the patient continue to monitor for any signs of infection or worsening. Based on what I am seeing I do believe that he is making good progress here on the heel but he needs to be careful with not having any additional injuries. 2. With regard to the sacral area this is not healing and improvement is much as we would like to see. For that reason I would recommend initiation of a wound VAC as soon as we get this ordered and approved. I think this could help fill this in much more significantly and faster. 3. I would also recommend continued and appropriate offloading regard to the heel in the sacral region. We will see patient back for reevaluation in 3 weeks here in the clinic. If anything worsens or changes patient will contact our office for additional recommendations. Electronic Signature(s) Signed: 07/15/2022 2:48:54 PM By: Lenda KelpStone III, Ivianna Notch PA-C Entered By: Lenda KelpStone III, Milika Ventress on 07/15/2022 14:48:54 -------------------------------------------------------------------------------- SuperBill Details Patient Name: Date of Service: [name] DawleyDEGRA [name], JO [facility] 07/15/2022 Medical Record Number: [id number] Patient Account Number: [id number] Date of Birth/Sex: Treating RN: 1955/12/08 (67 y.o. Arthur HolmsM) Woody, Kim Primary Care Provider: Dione BoozePartridge,  James Other Clinician: Referring Provider: Treating Provider/Extender: Donata DuffStone, Lasaro Primm Partridge, James Weeks in Treatment:  [address] [place], [place] ([id number]) [id number] Page 10 of 10 Diagnosis Coding ICD-10 Codes Code Description L89.613 Pressure ulcer of right heel, stage 3 L89.623 Pressure ulcer of left heel, stage 3 L89.154 Pressure ulcer of sacral region, stage 4 L24.A0 Irritant contact dermatitis due to friction or contact with body fluids, unspecified L98.412 Non-pressure chronic ulcer of buttock with fat layer exposed M62.81 Muscle weakness (generalized) F31.9 Bipolar disorder, unspecified E11.622 Type 2 diabetes mellitus with other skin ulcer I10 Essential (primary) hypertension Z79.01 Long term (current) use of anticoagulants 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 Facility Procedures : CPT4 Code: [id number] Description: 11042 - DEB SUBQ TISSUE 20 SQ CM/< ICD-10 Diagnosis Description L89.613 Pressure ulcer of right heel, stage 3 Modifier: Quantity: 1 Physician Procedures : CPT4 Code Description Modifier [id number] 99214 - WC PHYS LEVEL 4 - EST PT 25 ICD-10 Diagnosis Description L89.613 Pressure ulcer of right heel, stage 3 L89.623 Pressure ulcer of left heel, stage 3 L89.154 Pressure ulcer of sacral region, stage 4 L24.A0  Irritant contact dermatitis due to friction or contact with body fluids, unspecified Quantity: 1 : [id number] 11042 - WC PHYS SUBQ TISS 20 SQ CM ICD-10 Diagnosis Description L89.613 Pressure ulcer of right heel, stage 3 Quantity: 1 Electronic Signature(s) Signed: 07/15/2022 2:49:13 PM By: Lenda KelpStone III, Evelen Vazguez PA-C Entered By: Lenda KelpStone III, Trenese Haft on 07/15/2022 14:49:12

## 2022-08-05 ENCOUNTER — Encounter: Payer: Medicare Other | Attending: Physician Assistant | Admitting: Physician Assistant

## 2022-08-05 DIAGNOSIS — L98412 Non-pressure chronic ulcer of buttock with fat layer exposed: Secondary | ICD-10-CM | POA: Insufficient documentation

## 2022-08-05 DIAGNOSIS — Z86718 Personal history of other venous thrombosis and embolism: Secondary | ICD-10-CM | POA: Diagnosis not present

## 2022-08-05 DIAGNOSIS — D649 Anemia, unspecified: Secondary | ICD-10-CM | POA: Insufficient documentation

## 2022-08-05 DIAGNOSIS — L89154 Pressure ulcer of sacral region, stage 4: Secondary | ICD-10-CM | POA: Insufficient documentation

## 2022-08-05 DIAGNOSIS — I10 Essential (primary) hypertension: Secondary | ICD-10-CM | POA: Insufficient documentation

## 2022-08-05 DIAGNOSIS — Z7901 Long term (current) use of anticoagulants: Secondary | ICD-10-CM | POA: Insufficient documentation

## 2022-08-05 DIAGNOSIS — E1140 Type 2 diabetes mellitus with diabetic neuropathy, unspecified: Secondary | ICD-10-CM | POA: Insufficient documentation

## 2022-08-05 DIAGNOSIS — E1151 Type 2 diabetes mellitus with diabetic peripheral angiopathy without gangrene: Secondary | ICD-10-CM | POA: Diagnosis not present

## 2022-08-05 DIAGNOSIS — L89623 Pressure ulcer of left heel, stage 3: Secondary | ICD-10-CM | POA: Insufficient documentation

## 2022-08-05 DIAGNOSIS — E11622 Type 2 diabetes mellitus with other skin ulcer: Secondary | ICD-10-CM | POA: Diagnosis present

## 2022-08-05 DIAGNOSIS — L89613 Pressure ulcer of right heel, stage 3: Secondary | ICD-10-CM | POA: Insufficient documentation

## 2022-08-05 DIAGNOSIS — L24A0 Irritant contact dermatitis due to friction or contact with body fluids, unspecified: Secondary | ICD-10-CM | POA: Insufficient documentation

## 2022-08-09 NOTE — Progress Notes (Signed)
IANN, CLINGERMAN ([id number]) [id number] Page 1 of 9 Visit Report for 08/05/2022 Arrival Information Details Patient Name: Date of Service: [name] [facility] 08/05/2022 8:00 [place] Record Number: [id number] Patient Account Number: [id number] Date of Birth/Sex: Treating RN: [birth year][name] (66 y.o. [name]) [name] Primary Care Milanie Rosenfield: [name] Other Clinician: Referring Nandi Tonnesen: Treating Cataleya Cristina/Extender: [name]: 87 Visit Information History Since Last Visit Added or deleted any medications: No Patient Arrived: Ambulatory Any new allergies or adverse reactions: No Arrival Time: 08:[name] Transfer Assistance: None risk of falls: Patient Identification Verified: Yes Signs or symptoms of abuse/neglect since last visito No Secondary Verification Process Completed: Yes Hospitalized since last visit: No Patient Requires Transmission-Based Precautions: No Implantable device outside of the clinic excluding No Patient Has Alerts: Yes cellular tissue based products placed in the center Patient Alerts: Patient on Blood Thinner since last visit: Has Dressing in Place as Prescribed: Yes Pain Present Now: No Electronic Signature(s) Signed: 08/09/2022 12:08:06 PM By: [name] RN Entered By: [name] on 08/05/2022 08:08:[name] -------------------------------------------------------------------------------- Clinic Level of Care Assessment Details Patient Name: Date of Service: [name] [facility] 08/05/2022 8:00 [place] Record Number: [id number] Patient Account Number: [id number] Date of Birth/Sex: Treating RN: [birth year][name] (66 y.o. [name]) [name] Primary Care Shaniya Tashiro: [name] Other Clinician: Referring Chelesa Weingartner: Treating Kohl Polinsky/Extender: [name] in  Treatment: 36 Clinic Level of Care Assessment Items TOOL 4 Quantity Score X- 1 0 Use when only an EandM is performed on FOLLOW-UP visit ASSESSMENTS - Nursing Assessment / Reassessment X- 1 10 Reassessment of Co-morbidities (includes updates in patient status) X- 1 5 Reassessment of Adherence to Treatment Plan [name], [name] ([id number]) [id number] Page 2 of 9 ASSESSMENTS - Wound and Skin A ssessment / Reassessment []$  - Simple Wound Assessment / Reassessment - one wound 0 X- 2 5 Complex Wound Assessment / Reassessment - multiple wounds []$  - 0 Dermatologic / Skin Assessment (not related to wound area) ASSESSMENTS - Focused Assessment []$  - 0 Circumferential Edema Measurements - multi extremities []$  - 0 Nutritional Assessment / Counseling / Intervention []$  - 0 Lower Extremity Assessment (monofilament, tuning fork, pulses) []$  - 0 Peripheral Arterial Disease Assessment (using hand held doppler) ASSESSMENTS - Ostomy and/or Continence Assessment and Care []$  - 0 Incontinence Assessment and Management []$  - 0 Ostomy Care Assessment and Management (repouching, etc.) PROCESS - Coordination of Care X - Simple Patient / Family Education for ongoing care 1 15 []$  - 0 Complex (extensive) Patient / Family Education for ongoing care []$  - 0 Staff obtains Programmer, systems, Records, T Results / Process Orders est []$  - 0 Staff telephones HHA, Nursing Homes / Clarify orders / etc []$  - 0 Routine Transfer to another Facility (non-emergent condition) []$  - 0 Routine Hospital Admission (non-emergent condition) []$  - 0 New Admissions / Biomedical engineer / Ordering NPWT Apligraf, etc. , []$  - 0 Emergency Hospital Admission (emergent condition) X- 1 10 Simple Discharge Coordination []$  - 0 Complex (extensive) Discharge Coordination PROCESS - Special Needs []$  - 0 Pediatric / Minor Patient Management []$  - 0 Isolation Patient Management []$  - 0 Hearing / Language /  Visual special needs []$  - 0 Assessment of Community assistance (transportation, D/C planning, etc.) []$  - 0 Additional assistance / Altered mentation []$  - 0 Support Surface(s) Assessment (bed, cushion, seat, etc.) INTERVENTIONS - Wound Cleansing / Measurement []$  -  0 Simple Wound Cleansing - one wound X- 2 5 Complex Wound Cleansing - multiple wounds X- 1 5 Wound Imaging (photographs - any number of wounds) []$  - 0 Wound Tracing (instead of photographs) []$  - 0 Simple Wound Measurement - one wound X- 2 5 Complex Wound Measurement - multiple wounds INTERVENTIONS - Wound Dressings X - Small Wound Dressing one or multiple wounds 2 10 []$  - 0 Medium Wound Dressing one or multiple wounds []$  - 0 Large Wound Dressing one or multiple wounds []$  - 0 Application of Medications - topical []$  - 0 Application of Medications - injection INTERVENTIONS - Miscellaneous []$  - 0 External ear exam Snuffer, [name] ([id number].pdf Page 3 of 9 []$  - 0 Specimen Collection (cultures, biopsies, blood, body fluids, etc.) []$  - 0 Specimen(s) / Culture(s) sent or taken to Lab for analysis []$  - 0 Patient Transfer (multiple staff / Harrel Lemon Lift / Similar devices) []$  - 0 Simple Staple / Suture removal (25 or less) []$  - 0 Complex Staple / Suture removal ([name] or more) []$  - 0 Hypo / Hyperglycemic Management (close monitor of Blood Glucose) []$  - 0 Ankle / Brachial Index (ABI) - do not check if billed separately X- 1 5 Vital Signs Has the patient been seen at the hospital within the last three years: Yes Total Score: 100 Level Of Care: New/Established - Level 3 Electronic Signature(s) Signed: 08/09/2022 12:08:06 PM By: [name] RN Entered By: [name] on 08/05/2022 10:09:35 -------------------------------------------------------------------------------- Lower Extremity Assessment Details Patient Name: Date of Service: [name] [facility] 08/05/2022 8:00 [place] Record Number: [id number] Patient Account Number: [id number] Date of Birth/Sex: Treating RN: [name] (66 y.o. [name]) [name] Primary Care Brendy Ficek: [name] Other Clinician: Referring Esme Freund: Treating Myriah Boggus/Extender: [name]: 36 Vascular Assessment Pulses: Dorsalis Pedis Palpable: [Right:Yes] Electronic Signature(s) Signed: 08/09/2022 12:08:06 PM By: [name] RN Entered By: [name] on 08/05/2022 08:14:23 -------------------------------------------------------------------------------- Multi Wound Chart Details Patient Name: Date of Service: [name] [facility] 08/05/2022 8:00 [place] Number: [id number] Patient Account Number: [id number] Date of Birth/Sex: Treating RN: [name]/[birth year]/[name] (66 y.o. [name] Primary Care Alexxus Sobh: [name] Other Clinician: [name] ([id number]) [id number] Page 4 of 9 Referring Cheyenna Pankowski: Treating Tyiesha Brackney/Extender: [name]: 36 Vital Signs Height(in): 63 Pulse(bpm): 72 Weight(lbs): 90 Blood Pressure(mmHg): 177/75 Body Mass Index(BMI): 45 Temperature(F): 97.9 Respiratory Rate(breaths/min): 16 [5:Photos:] [N/A:N/A] Right Calcaneus Sacrum N/A Wound Location: Pressure Injury Pressure Injury N/A Wounding Event: Pressure Ulcer Pressure Ulcer N/A Primary Etiology: Anemia, Sleep Apnea, Coronary Artery Anemia, Sleep Apnea, Coronary Artery N/A Comorbid History: Disease, Deep Vein Thrombosis, Disease, Deep Vein Thrombosis, Hypertension, Type II Diabetes, Hypertension, Type II Diabetes, History of pressure wounds, History of pressure wounds, Neuropathy Neuropathy 07/01/2021 07/01/2021 N/A Date Acquired: 44 36 N/A Weeks of Treatment: Open Open N/A Wound Status: No No N/A Wound Recurrence: 3x2.3x0.4 3x2x3.5 N/A Measurements L x W x D (cm) 5.419 4.712 N/A A (cm)  : rea 2.168 16.493 N/A Volume (cm) : -30.90% -50.00% N/A % Reduction in A rea: -423.70% -75.00% N/A % Reduction in Volume: Category/Stage III Category/Stage IV N/A Classification: Medium Medium N/A Exudate A mount: Serosanguineous Serosanguineous N/A Exudate Type: red, brown red, brown N/A Exudate Color: Medium (34-66%) Large (67-100%) N/A Granulation A mount: Red, Pink Red N/A Granulation Quality: Medium (34-66%) None Present (0%) N/A Necrotic A mount: Fat Layer (Subcutaneous Tissue): Yes Fat Layer (Subcutaneous Tissue): Yes N/A Exposed Structures: Fascia:  No Fascia: No Tendon: No Tendon: No Muscle: No Muscle: No Joint: No Joint: No Bone: No Bone: No None None N/A Epithelialization: Treatment Notes Electronic Signature(s) Signed: 08/09/2022 12:08:06 PM By: [name] RN Entered By: [name] on 08/05/2022 08:14:28 -------------------------------------------------------------------------------- [place] Details Patient Name: Date of Service: [name] [facility] 08/05/2022 8:00 [name] Record Number: [id number] Patient Account Number: [id number] DRAXLER, SEVERSON ([id number]) [id number].pdf Page 5 of 9 Date of Birth/Sex: Treating RN: [birth year] (66 y.o. [name]) [name] Primary Care Crew Goren: [name] Other Clinician: Referring Saranne Crislip: Treating Senta Kantor/Extender: [name]: 36 Active Inactive Wound/Skin Impairment Nursing Diagnoses: Knowledge deficit related to ulceration/compromised skin integrity Goals: Patient/caregiver will verbalize understanding of skin care regimen Date Initiated: 11/22/2021 Target Resolution Date: 08/25/2022 Goal Status: Active Ulcer/skin breakdown will have a volume reduction of 30% by week 4 Date Initiated: 11/22/2021 Date Inactivated: 01/10/2022 Target Resolution Date: 12/23/2021 Goal Status: Unmet Unmet Reason:  comorbities Ulcer/skin breakdown will have a volume reduction of 50% by week 8 Date Initiated: 11/22/2021 Date Inactivated: 05/07/2022 Target Resolution Date: 01/22/2022 Goal Status: Unmet Unmet Reason: comorbidities Ulcer/skin breakdown will have a volume reduction of 80% by week 12 Date Initiated: 11/22/2021 Date Inactivated: 05/07/2022 Target Resolution Date: 02/22/2022 Goal Status: Unmet Unmet Reason: comorbidities Ulcer/skin breakdown will heal within 14 weeks Date Initiated: 11/22/2021 Date Inactivated: 05/07/2022 Target Resolution Date: 03/25/2022 Goal Status: Unmet Unmet Reason: comorbidities Interventions: Assess patient/caregiver ability to obtain necessary supplies Assess patient/caregiver ability to perform ulcer/skin care regimen upon admission and as needed Assess ulceration(s) every visit Notes: Electronic Signature(s) Signed: 08/09/2022 12:08:06 PM By: [name] RN Entered By: [name] on 08/05/2022 08:15:00 -------------------------------------------------------------------------------- Pain Assessment Details Patient Name: Date of Service: [name] [facility] 08/05/2022 8:00 [place] Record Number: [id number] Patient Account Number: [id number] Date of Birth/Sex: Treating RN: [birth year][name] (66 y.o. [name] Primary Care Kenrick Pore: [name] Other Clinician: Referring Thaddus Mcdowell: Treating Tahjai Schetter/Extender: [name]: 36 Active Problems Location of Pain Severity and Description of Pain Patient Has Paino No Site Locations [place], [name] ([id number]) [id number] Page 6 of 9 Pain Management and Medication Current Pain Management: Electronic Signature(s) Signed: 08/09/2022 12:08:06 PM By: [name] RN Entered By: [name] on 08/05/2022 08:08:27 -------------------------------------------------------------------------------- Patient/Caregiver Education Details Patient  Name: Date of Service: [name] [facility] 2/5/2024andnbsp8:00 [place] Record Number: [id number] Patient Account Number: [id number] Date of Birth/Gender: Treating RN: [birth year][name] (66 y.o. [name]) [name] Primary Care Physician: [name] Other Clinician: Referring Physician: Treating Physician/Extender: [name]: 36 Education Assessment Education Provided To: Patient Education Topics Provided Wound/Skin Impairment: Methods: Explain/Verbal Responses: State content correctly Electronic Signature(s) Signed: 08/09/2022 12:08:06 PM By: [name] RN Entered By: [name] on 08/05/2022 08:14:46 Opfer, [name] ([id number] Page 7 of 9 -------------------------------------------------------------------------------- Wound Assessment Details Patient Name: Date of Service: [name] [facility] 08/05/2022 8:00 [place] Record Number: [id number] Patient Account Number: [id number] Date of Birth/Sex: Treating RN: [birth year] (66 y.o. [name]) [name] Primary Care Emalia Witkop: [name] Other Clinician: Referring Rayven Hendrickson: Treating Monick Rena/Extender: [name]: 36 Wound Status Wound Number: 5 Primary Pressure Ulcer Etiology: Wound Location: Right Calcaneus Wound Open Wounding Event: Pressure Injury Status: Date Acquired: 07/01/2021 Comorbid Anemia, Sleep Apnea, Coronary Artery Disease, Deep Vein Weeks Of Treatment: 36 History: Thrombosis, Hypertension, Type II Diabetes, History of pressure Clustered Wound: No wounds, Neuropathy Photos Wound Measurements Length: (cm) 3  Width: (cm) 2.3 Depth: (cm) 0.4 Area: (cm) 5.419 Volume: (cm) 2.168 % Reduction in Area: -30.9% % Reduction in Volume: -423.7% Epithelialization: None Tunneling: No Undermining: No Wound Description Classification: Category/Stage III Exudate Amount: Medium Exudate  Type: Serosanguineous Exudate Color: red, brown Foul Odor After Cleansing: No Slough/Fibrino Yes Wound Bed Granulation Amount: Medium (34-66%) Exposed Structure Granulation Quality: Red, Pink Fascia Exposed: No Necrotic Amount: Medium (34-66%) Fat Layer (Subcutaneous Tissue) Exposed: Yes Necrotic Quality: Adherent Slough Tendon Exposed: No Muscle Exposed: No Joint Exposed: No Bone Exposed: No Electronic Signature(s) Signed: 08/09/2022 12:08:06 PM By: [name] RN Entered By: [name] on 08/05/2022 08:13:40 Ledin, [name] ([id number] Page 8 of 9 -------------------------------------------------------------------------------- Wound Assessment Details Patient Name: Date of Service: [name] [facility] 08/05/2022 8:00 [name] Record Number: [id number] Patient Account Number: [id number] Date of Birth/Sex: Treating RN: [name], [name] (66 y.o. [name]) [name] Primary Care Leary Mcnulty: [name] Other Clinician: Referring [name]: Treating Annastasia Haskins/Extender: [name]: 36 Wound Status Wound Number: 7 Primary Pressure Ulcer Etiology: Wound Location: Sacrum Wound Open Wounding Event: Pressure Injury Status: Date Acquired: 07/01/2021 Comorbid Anemia, Sleep Apnea, Coronary Artery Disease, Deep Vein Weeks Of Treatment: 36 History: Thrombosis, Hypertension, Type II Diabetes, History of pressure Clustered Wound: No wounds, Neuropathy Photos Wound Measurements Length: (cm) 3 Width: (cm) 2 Depth: (cm) 3.5 Area: (cm) 4.712 Volume: (cm) 16.493 % Reduction in Area: -50% % Reduction in Volume: -75% Epithelialization: None Tunneling: No Undermining: No Wound Description Classification: Category/Stage IV Exudate Amount: Medium Exudate Type: Serosanguineous Exudate Color: red, brown Foul Odor After Cleansing: No Slough/Fibrino Yes Wound Bed Granulation Amount: Large (67-100%) Exposed  Structure Granulation Quality: Red Fascia Exposed: No Necrotic Amount: None Present (0%) Fat Layer (Subcutaneous Tissue) Exposed: Yes Tendon Exposed: No Muscle Exposed: No Joint Exposed: No Bone Exposed: No Electronic Signature(s) Signed: 08/09/2022 12:08:06 PM By: [name] RN Entered By: [name] on 08/05/2022 08:13:59 Spires, [name] ([id number] Page 9 of 9 -------------------------------------------------------------------------------- Vitals Details Patient Name: Date of Service: [name] [facility] 08/05/2022 8:00 [place] Record Number: [id number] Patient Account Number: [id number] Date of Birth/Sex: Treating RN: [name], [name] (66 y.o. [name]) [name] Primary Care Fremon Zacharia: [name] Other Clinician: Referring Elsy Chiang: Treating Jawaan Adachi/Extender: [name]: 36 Vital Signs Time Taken: 08:08 Temperature (F): 97.9 Height (in): 66 Pulse (bpm): 89 Weight (lbs): 279 Respiratory Rate (breaths/min): 16 Body Mass Index (BMI): 45 Blood Pressure (mmHg): 177/75 Reference Range: 80 - 120 mg / dl Electronic Signature(s) Signed: 08/09/2022 12:08:06 PM By: [name] RN Entered By: [name] on 08/05/2022 08:08:19

## 2022-08-09 NOTE — Progress Notes (Signed)
JAMAN, ZERBE ([id number]) [id number] Page 1 of 10 Visit Report for 08/05/2022 Chief Complaint Document Details Patient Name: Date of Service: [name] [facility] 08/05/2022 8:00 [place] Record Number: [id number] Patient Account Number: [id number] Date of Birth/Sex: Treating RN: [birth year] (66 y.o. [name]) [name] Primary Care Provider: [name] Other Clinician: Referring Provider: Treating Provider/Extender: [name]: 36 Information Obtained from: Patient Chief Complaint Sacral, right gluteal, and bilateral heel ulcers Electronic Signature(s) Signed: 08/05/2022 6:11:00 PM By: [name] PA-C Entered By: [name] on 08/05/2022 08:08:09 -------------------------------------------------------------------------------- Debridement Details Patient Name: Date of Service: [name] [facility] 08/05/2022 8:00 [place] Record Number: [id number] Patient Account Number: [id number] Date of Birth/Sex: Treating RN: [birth year] (66 y.o. [name]) [name] Primary Care Provider: [name] Other Clinician: Referring Provider: Treating Provider/Extender: [name]: 36 Debridement Performed for Assessment: Wound #5 Right Calcaneus Performed By: Physician Tommie Sams., PA-C Debridement Type: Debridement Level of Consciousness (Pre-procedure): Awake and Alert Pre-procedure Verification/Time Out Yes - 08:27 Taken: Start Time: 08:27 T Area Debrided (L x W): otal 3 (cm) x 2.3 (cm) = 6.9 (cm) Tissue and other material debrided: Viable, Non-Viable, Callus, Slough, Subcutaneous, Skin: Dermis , Skin: Epidermis, Slough Level: Skin/Subcutaneous Tissue Debridement Description: Excisional Instrument: Curette Bleeding: Moderate Hemostasis Achieved: Pressure End Time: 08:29 Procedural Pain: 0 Post Procedural Pain: 0 Response to Treatment: Procedure was  tolerated well Level of Consciousness ([address]DAMONT, JANKIEWICZ ([id number]) [id number] Page 2 of 10 Level of Consciousness (Post- Awake and Alert procedure): Post Debridement Measurements of Total Wound Length: (cm) 3 Stage: Category/Stage III Width: (cm) 2.3 Depth: (cm) 0.4 Volume: (cm) 2.168 Character of Wound/Ulcer Post Debridement: Improved Post Procedure Diagnosis Same as Pre-procedure Electronic Signature(s) Signed: 08/05/2022 6:11:00 PM By: [name] PA-C Signed: 08/09/2022 12:08:06 PM By: [name] RN Entered By: [name] on 08/05/2022 08:29:23 -------------------------------------------------------------------------------- HPI Details Patient Name: Date of Service: [name] [facility] 08/05/2022 8:00 [place] Record Number: [id number] Patient Account Number: [id number] Date of Birth/Sex: Treating RN: 1955/08/03 (66 y.o. Oval Linsey Primary Care Provider: [name] Other Clinician: Referring Provider: Treating Provider/Extender: [name]: 36 History of Present Illness HPI Description: 05/29/2021 this is a patient who presents today for initial inspection here in the clinic concerning wounds that he has over the right heel and the sacral region. Unfortunately the sacral wound is starting to spread off to the right gluteal location due to how he sits always leaning towards the right side in his chair. His wife is present she is the primary caregiver though she is not home with him all the time she does have to work. She does do an excellent job however it appears to be in trying to keep things under good control for him. The patient is not able to change positions himself nor walk by himself so he is pretty much at the mercy of the position he is putting when she is gone and this tends to be his chair which she sits and most of the day. Obviously this I think is the main culprit  for what is going on currently. It was actually in January 2020 when the sacral wound started. It was in September 2022 when the wound started to spread more to the right gluteal location. Subsequently in August 2022 is when he had been in a skilled nursing facility and the heel started to give him  trouble as well. That does not seem to be doing nearly as poorly as the sacral region. He was hospitalized in October 2022 secondary to sepsis and this was in regard to the foot and was sent to skilled nursing again he is now back at home. He did have a wound VAC for the sacral wound over the summer 2022 but being in and out of facilities this ended up getting sent back. The patient does have [facility] home health that comes out 1 time per week to help with care. His most recent hemoglobin A1c was 6.9 in August 2022. 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 s. 07/24/2021 upon evaluation today patient appears to be doing decently well in regard to the pressure ulcer on the right heel as well as the sacral region. In general I think you are making some progress here which is great news. Overall the heel unfortunately had already closed previously when we saw him although it apparently reopened when he was working with physical therapy according to his wife. The area in the sacral region is doing well and looks clean there is still some depth here but I still think it would be difficult to wound VAC this region. His wife does an awesome job taking care of him. Is been so long since we have seen him because he has been in the hospital to be honest. 08/07/2021 upon evaluation today patient appears to be doing well at this time. Fortunately I do not see any signs of active infection locally or systemically  at this time which is great news. No fevers, chills, nausea, vomiting, or diarrhea. Unfortunately after I saw him on the 24th he actually ended up in the hospital in the 27th due to being septic. This was not due to the wounds but after looking at records actually due to a UTI. Fortunately he is doing much better and very happy in that regard. I do not see any signs of infection locally nor systemically at this time. 08/21/2021 upon evaluation today patient appears to be doing well with regard to his wound. Fortunately I think that the sacral region is doing decently well at this point which is great news. With regard to the foot this also does have some slough noted but I feel like we are making progress here. He does have some irritation around the right upper thigh/gluteal region. I feel like it is more towards the thigh. Nonetheless this does appear to be pressure related he spends a lot of time sitting up his caregiver states he really will not get in the bed and stay there like he should. 09/04/2021 upon evaluation patient appears to be doing decently well today in regard to the wound on his heel as well as the sacral area. I am actually very pleased with both and how things are appearing currently. There does not appear to be any evidence of active infection I think that his caregiver is doing an awesome job with regard to the wound care here. She is present today as well and I did discuss this with her. GIANG, PALLESCHI ([id number]) [id number] Page 3 of 10 09/25/2021 upon evaluation today patient appears to be doing well with regard to his wound on the sacral region. His right heel is also doing well. Unfortunately he has a new area on his  left heel which does appear to be pressure injury. I do not see any evidence of active infection locally or systemically which is great news no fevers, chills, nausea, vomiting, or diarrhea. Readmission: 11-22-2021 upon  evaluation today patient presents for reevaluation here in the clinic concerning issues with his wounds. Since have last seen him he was in the hospital and then subsequently ended up in a skilled nursing facility. Subsequently period of time in the facility he actually had a breakdown in the right thigh location which has been an issue for him. Fortunately I do not see any evidence of active infection locally or systemically which is great news and I am very pleased in that regard. Nonetheless he does have still the wounds on both heels as well as the wound in the sacral area and the new area in the right upper thigh/gluteal region. 12-13-2021 upon evaluation today patient appears to be doing well currently in regard to his wounds. The left heel is going to require some sharp debridement. Fortunately the right heel seems to be doing quite well. Overall I think his gluteal region as well as sacral region also are doing well. 01-10-2022 upon evaluation today patient appears to be doing better in regard to some areas unfortunately his sacral area is not doing as well. It seems of gotten worse his caregiver states when he was in the hospital recently they really did not offloading. Fortunately I do not see any evidence of active infection locally or systemically at this time. 02-01-2022 upon evaluation today patient appears to be doing well currently in regard to his wounds. He is going require some sharp debridement of clearway some of the necrotic debris. Fortunately I do not see any evidence of active infection locally or systemically which is great news. No fevers, chills, nausea, vomiting, or diarrhea. 02-22-2022 upon evaluation today patient appears to be doing poorly in regard to his right heel the left heel is completely closed. Unfortunately I think he is got some deep tissue injury to this area I am going to do a culture at this spot. With that being said he is having 2 other significant issues in  regard to the left thigh this is showing signs of cellulitis all the way down into the thigh and was seeing definite temperature difference in the left thigh versus the right thigh when feeling. It also is of note that the patient is also having some issues here with trouble in regard to having fevers his wife has been giving him Tylenol and ibuprofen to help keep the fevers under control but he has been as high as 102 this is pretty much been going on since Monday or Tuesday of this week. Nonetheless I am concerned I am not exactly sure where the cellulitis on the left thigh is coming from not sure if this is emanating from his sacral region that is a possibility or if there is something completely different going on either way with the fevers I am more worried that he needs to go get this checked out as soon as possible. 03-08-2022 upon evaluation today patient appears to be doing well with regard to his heel as well as sacral region. Both are showing signs of significant improvement compared to the last time I saw him. Overall I think we are in a much better spot 04-04-2022 upon evaluation today patient appears to be doing about the same in regard to his wounds. I do not see any signs of improvement nor  worsening at this point. Fortunately there is no evidence of active infection at this time. He is not staying off of this is much as he needs to in fact is not even sleeping in the hospital bed with the alternating air mattress. 04-16-2022 upon evaluation today patient appears to be doing well currently in regard to his wound. He has been tolerating the dressing changes without complication. Fortunately I do not see any evidence of infection. The heel actually looks quite well the sacral area actually is still quite deep. From my questioning and discussion with him today I do not believe that he is really staying off of this as much as he should. He seemed a little uncomfortable during that portion of  the conversation today. I think that is contributing quite a bit to his lack of progress with regard to healing. 11/7; patient with decubitus ulcers on his right heel as well as his lower sacrum. We have been using Hydrofera Blue. He has home health predominantly for Foley catheter management. There have been some issues getting the Hydrofera Blue through home health but apparently the patient's wife has some supplies and is managing to get these changed.They come every 3 weeks because of transportation difficulties 05-28-2022 upon evaluation today patient appears to be doing decently well currently in regard to his wounds. He still has a depth to the wound in the sacral region but again this does seem to be a little bit less than previous. He has been using Hydrofera Blue both on this in the heel ulcer. Both are going to require some cleaning today sharp debridement on the heel I think is cleaning with saline gauze and the sterile Q-tip is probably sufficient for the sacral area which does not appear to have too much necrotic tissue. 06-18-2022 upon evaluation today patient's wounds are showing signs of doing decently well. Fortunately I do not see any signs of active infection locally nor systemically at this time which is great news. No fevers, chills, nausea, vomiting, or diarrhea. 07-15-2022 upon evaluation today patient appears to be doing well currently in regard to his wound. He has been tolerating the dressing changes without complication. Fortunately I think that the heel is doing well except for where it is causing pressure to the heel posteriorly and this apparently is due to some kind of exercise machine that his children got him. Nonetheless I am not sure that that is going to be helpful especially if it is causing discomfort and pain. And especially if it is making the wound worse which is an even bigger deal. With regard to the patient's sacral region this is draining quite significantly.  I do think a wound VAC would be ideal here and I would recommend that we go ahead and try to see about getting this ordered for him as soon as possible. The patient's wife and the patient are in agreement with this plan. 08-05-2022 upon evaluation today patient appears to be doing well currently in regard to both his heel and his sacral region. Unfortunately he is not eligible for a wound VAC as he is previously already undergone all the treatment that is allowable under Medicare guidelines for his sacral wound. This is quite unfortunate as I felt like that could have potentially help this speed this up but nonetheless it is where we stand. For that reason I think the [facility] is probably still the best way to continue at this point and I discussed that with the patient and  his wife today. Electronic Signature(s) Signed: 08/05/2022 6:05:58 PM By: [name] PA-C Entered By: [name] on 08/05/2022 18:05:58 Physical Exam Details -------------------------------------------------------------------------------- [name] ([id number]) [id number] Page 4 of 10 Patient Name: Date of Service: [name] [facility] 08/05/2022 8:00 [place] Record Number: [id number] Patient Account Number: [id number] Date of Birth/Sex: Treating RN: [birth year] (66 y.o. [name]) [name] Primary Care Provider: [name] Other Clinician: Referring Provider: Treating Provider/Extender: [name]: 1 Constitutional Well-nourished and well-hydrated in no acute distress. Respiratory normal breathing without difficulty. Psychiatric this patient is able to make decisions and demonstrates good insight into disease process. Alert and Oriented x 3. pleasant and cooperative. Notes Upon inspection patient's wound bed actually showed signs of good granulation epithelization there was some need for sharp debridement in regard to  the heel and he tolerated that today without complication. Postdebridement the wound bed is significantly improved and seems to be doing excellent. Electronic Signature(s) Signed: 08/05/2022 6:06:12 PM By: [name] PA-C Entered By: [name] on 08/05/2022 18:06:12 -------------------------------------------------------------------------------- Physician Orders Details Patient Name: Date of Service: [name] [facility] 08/05/2022 8:00 [place] Record Number: [id number] Patient Account Number: [id number] Date of Birth/Sex: Treating RN: 16-Oct-1955 (66 y.o. [name]) [name] Primary Care Provider: [name] Other Clinician: Referring Provider: Treating Provider/Extender: [name]: 34 Verbal / Phone Orders: No Diagnosis Coding ICD-10 Coding Code Description L89.613 Pressure ulcer of right heel, stage 3 L89.623 Pressure ulcer of left heel, stage 3 L89.154 Pressure ulcer of sacral region, stage 4 L24.A0 Irritant contact dermatitis due to friction or contact with body fluids, unspecified L98.412 Non-pressure chronic ulcer of buttock with fat layer exposed M62.81 Muscle weakness (generalized) F31.9 Bipolar disorder, unspecified E11.622 Type 2 diabetes mellitus with other skin ulcer I10 Essential (primary) hypertension Z79.01 Long term (current) use of anticoagulants 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 [place] for wound care. May utilize formulary equivalent dressing for wound treatment orders unless otherwise specified. Home Health Nurse may visit PRN to address patients wound care needs. Lajean Manes [phone] Scheduled days for dressing changes to be completed; exception, patient has scheduled wound care visit that day. **Please direct any NON-WOUND related issues/requests for orders to patient's Primary Care Physician. **If current  dressing causes MADDISON, STRIEGEL ([id number]) [id number] Page 5 of 10 regression in wound condition, may D/C ordered dressing product/s and apply Normal Saline Moist Dressing daily until next [place] or Other MD appointment. **Notify [facility] of regression in wound condition at [phone]. Bathing/ Shower/ Hygiene No tub bath. Anesthetic (Use 'Patient Medications' Section for Anesthetic Order Entry) Lidocaine applied to wound bed Edema Control - Lymphedema / Segmental Compressive Device / Other Elevate, Exercise Daily and A void Standing for Long Periods of Time. Elevate legs to the level of the heart and pump ankles as often as possible Elevate leg(s) parallel to the floor when sitting. DO YOUR BEST to sleep in the bed at night. DO NOT sleep in your recliner. Long hours of sitting in a recliner leads to swelling of the legs and/or potential wounds on your backside. Off-Loading Gel wheelchair cushion Low air-loss mattress (Group 2) - Sleep in bed every night. Turn and reposition every 2 hours - keep pressure off of the sacrum and heels wounds Other: - PRAFO boot in bed keep pressure off of sacrum/gluteus and heels- Additional  Orders / Instructions Follow Nutritious Diet and Increase Protein Intake Wound Treatment Wound #5 - Calcaneus Wound Laterality: Right Prim Dressing: Hydrofera Blue Ready Transfer Foam, 2.5x2.5 (in/in) 3 x Per Week/30 Days ary Discharge Instructions: Apply Hydrofera Blue Ready to wound bed as directed Secondary Dressing: (BORDER) Zetuvit Plus SILICONE BORDER Dressing 4x4 (in/in) 3 x Per Week/30 Days Discharge Instructions: Please do not put silicone bordered dressings under wraps. Use non-bordered dressing only. Wound #7 - Sacrum Topical: calmoseptine 1 x Per Day/30 Days Discharge Instructions: apply to excorated areas Prim Dressing: Hydrofera Blue Ready Transfer Foam, 4x5 (in/in) 1 x Per Day/30  Days ary Discharge Instructions: spiral hydrofera blue to lightly pack into wound, then cut to cover wound Secondary Dressing: (BORDER) Zetuvit Plus SILICONE BORDER Dressing 4x4 (in/in) 1 x Per Day/30 Days Discharge Instructions: Please do not put silicone bordered dressings under wraps. Use non-bordered dressing only. Electronic Signature(s) Signed: 08/05/2022 2:47:02 PM By: [name] RN Signed: 08/05/2022 6:11:00 PM By: [name] PA-C Entered By: [name] on 08/05/2022 14:47:02 -------------------------------------------------------------------------------- Problem List Details Patient Name: Date of Service: [name] [facility] 08/05/2022 8:00 [place] Medical Record Number: [id number] Patient Account Number: [id number] Date of Birth/Sex: Treating RN: 1956-04-25 (66 y.o. [name]) [name] Primary Care Provider: [name] Other Clinician: Referring Provider: Treating Provider/Extender: [name]: [address] Active Problems ICD-10 YEHYA, ZILE ([id number]) [id number] Page 6 of 10 Encounter Code Description Active Date MDM Diagnosis L89.613 Pressure ulcer of right heel, stage 3 11/22/2021 No Yes L89.623 Pressure ulcer of left heel, stage 3 11/22/2021 No Yes L89.154 Pressure ulcer of sacral region, stage 4 11/22/2021 No Yes L24.A0 Irritant contact dermatitis due to friction or contact with body fluids, 11/22/2021 No Yes unspecified L98.412 Non-pressure chronic ulcer of buttock with fat layer exposed 11/22/2021 No Yes M62.81 Muscle weakness (generalized) 11/22/2021 No Yes F31.9 Bipolar disorder, unspecified 11/22/2021 No Yes E11.622 Type 2 diabetes mellitus with other skin ulcer 11/22/2021 No Yes I10 Essential (primary) hypertension 11/22/2021 No Yes Z79.01 Long term (current) use of anticoagulants 11/22/2021 No Yes 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 11/22/2021 No Yes Inactive Problems Resolved  Problems Electronic Signature(s) Signed: 08/05/2022 6:11:00 PM By: [name] PA-C Entered By: [name] on 08/05/2022 08:08:02 -------------------------------------------------------------------------------- Progress Note Details Patient Name: Date of Service: [name] [facility] 08/05/2022 8:00 [place] Record Number: [id number] Patient Account Number: [id number] Date of Birth/Sex: Treating RN: 08-20-1955 (66 y.o. Oval Linsey Primary Care Provider: [name] Other Clinician: [name] ([id number]) [id number] Page 7 of 10 Referring Provider: Treating Provider/Extender: [name]: 36 Subjective Chief Complaint Information obtained from Patient Sacral, right gluteal, and bilateral heel ulcers History of Present Illness (HPI) 05/29/2021 this is a patient who presents today for initial inspection here in the clinic concerning wounds that he has over the right heel and the sacral region. Unfortunately the sacral wound is starting to spread off to the right gluteal location due to how he sits always leaning towards the right side in his chair. His wife is present she is the primary caregiver though she is not home with him all the time she does have to work. She does do an excellent job however it appears to be in trying to keep things under good control for him. The patient is not able to change positions himself nor walk by himself so he is pretty much at the mercy of the position he is  putting when she is gone and this tends to be his chair which she sits and most of the day. Obviously this I think is the main culprit for what is going on currently. It was actually in January 2020 when the sacral wound started. It was in September 2022 when the wound started to spread more to the right gluteal location. Subsequently in August 2022 is when he had been in a skilled nursing facility and the  heel started to give him trouble as well. That does not seem to be doing nearly as poorly as the sacral region. He was hospitalized in October 2022 secondary to sepsis and this was in regard to the foot and was sent to skilled nursing again he is now back at home. He did have a wound VAC for the sacral wound over the summer 2022 but being in and out of facilities this ended up getting sent back. The patient does have [facility] home health that comes out 1 time per week to help with care. His most recent hemoglobin A1c was 6.9 in August 2022. 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 s. 07/24/2021 upon evaluation today patient appears to be doing decently well in regard to the pressure ulcer on the right heel as well as the sacral region. In general I think you are making some progress here which is great news. Overall the heel unfortunately had already closed previously when we saw him although it apparently reopened when he was working with physical therapy according to his wife. The area in the sacral region is doing well and looks clean there is still some depth here but I still think it would be difficult to wound VAC this region. His wife does an awesome job taking care of him. Is been so long since we have seen him because he has been in the hospital to be honest. 08/07/2021 upon evaluation today patient appears to be doing well at this time. Fortunately I do not see any signs of active infection locally or systemically at this time which is great news. No fevers, chills, nausea, vomiting, or diarrhea. Unfortunately after I saw him on the 24th he actually ended up in the hospital in the 27th due to being septic. This was not due to the wounds but after looking at records actually due to a UTI.  Fortunately he is doing much better and very happy in that regard. I do not see any signs of infection locally nor systemically at this time. 08/21/2021 upon evaluation today patient appears to be doing well with regard to his wound. Fortunately I think that the sacral region is doing decently well at this point which is great news. With regard to the foot this also does have some slough noted but I feel like we are making progress here. He does have some irritation around the right upper thigh/gluteal region. I feel like it is more towards the thigh. Nonetheless this does appear to be pressure related he spends a lot of time sitting up his caregiver states he really will not get in the bed and stay there like he should. 09/04/2021 upon evaluation patient appears to be doing decently well today in regard to the wound on his heel as well as the sacral area. I am actually very pleased with both and how things are appearing  currently. There does not appear to be any evidence of active infection I think that his caregiver is doing an awesome job with regard to the wound care here. She is present today as well and I did discuss this with her. 09/25/2021 upon evaluation today patient appears to be doing well with regard to his wound on the sacral region. His right heel is also doing well. Unfortunately he has a new area on his left heel which does appear to be pressure injury. I do not see any evidence of active infection locally or systemically which is great news no fevers, chills, nausea, vomiting, or diarrhea. Readmission: 11-22-2021 upon evaluation today patient presents for reevaluation here in the clinic concerning issues with his wounds. Since have last seen him he was in the hospital and then subsequently ended up in a skilled nursing facility. Subsequently period of time in the facility he actually had a breakdown in the right thigh location which has been an issue for him. Fortunately I do not see any  evidence of active infection locally or systemically which is great news and I am very pleased in that regard. Nonetheless he does have still the wounds on both heels as well as the wound in the sacral area and the new area in the right upper thigh/gluteal region. 12-13-2021 upon evaluation today patient appears to be doing well currently in regard to his wounds. The left heel is going to require some sharp debridement. Fortunately the right heel seems to be doing quite well. Overall I think his gluteal region as well as sacral region also are doing well. 01-10-2022 upon evaluation today patient appears to be doing better in regard to some areas unfortunately his sacral area is not doing as well. It seems of gotten worse his caregiver states when he was in the hospital recently they really did not offloading. Fortunately I do not see any evidence of active infection locally or systemically at this time. 02-01-2022 upon evaluation today patient appears to be doing well currently in regard to his wounds. He is going require some sharp debridement of clearway some of the necrotic debris. Fortunately I do not see any evidence of active infection locally or systemically which is great news. No fevers, chills, nausea, vomiting, or diarrhea. 02-22-2022 upon evaluation today patient appears to be doing poorly in regard to his right heel the left heel is completely closed. Unfortunately I think he is got some deep tissue injury to this area I am going to do a culture at this spot. With that being said he is having 2 other significant issues in regard to the left thigh this is showing signs of cellulitis all the way down into the thigh and was seeing definite temperature difference in the left thigh versus the right thigh when feeling. It also is of note that the patient is also having some issues here with trouble in regard to having fevers his wife has been giving him Tylenol and ibuprofen to help keep the fevers  under control but he has been as high as 102 this is pretty much been going on since Monday or Tuesday of this week. Nonetheless I am concerned I am not exactly sure where the cellulitis on the left thigh is coming from not sure if this is emanating from his sacral region that is a possibility or if there is something completely different going on either way with the fevers I am more worried that he needs to go get this checked  out as soon as possible. 03-08-2022 upon evaluation today patient appears to be doing well with regard to his heel as well as sacral region. Both are showing signs of significant improvement compared to the last time I saw him. Overall I think we are in a much better spot 04-04-2022 upon evaluation today patient appears to be doing about the same in regard to his wounds. I do not see any signs of improvement nor worsening at this point. Fortunately there is no evidence of active infection at this time. He is not staying off of this is much as he needs to in fact is not even sleeping in the hospital bed with the alternating air mattress. 04-16-2022 upon evaluation today patient appears to be doing well currently in regard to his wound. He has been tolerating the dressing changes without complication. Fortunately I do not see any evidence of infection. The heel actually looks quite well the sacral area actually is still quite deep. From my questioning and discussion with him today I do not believe that he is really staying off of this as much as he should. He seemed a little uncomfortable during JALEEL, COBAUGH ([id number]) [id number] Page 8 of 10 that portion of the conversation today. I think that is contributing quite a bit to his lack of progress with regard to healing. 11/7; patient with decubitus ulcers on his right heel as well as his lower sacrum. We have been using Hydrofera Blue. He has home health predominantly for Foley catheter  management. There have been some issues getting the Hydrofera Blue through home health but apparently the patient's wife has some supplies and is managing to get these changed.They come every 3 weeks because of transportation difficulties 05-28-2022 upon evaluation today patient appears to be doing decently well currently in regard to his wounds. He still has a depth to the wound in the sacral region but again this does seem to be a little bit less than previous. He has been using Hydrofera Blue both on this in the heel ulcer. Both are going to require some cleaning today sharp debridement on the heel I think is cleaning with saline gauze and the sterile Q-tip is probably sufficient for the sacral area which does not appear to have too much necrotic tissue. 06-18-2022 upon evaluation today patient's wounds are showing signs of doing decently well. Fortunately I do not see any signs of active infection locally nor systemically at this time which is great news. No fevers, chills, nausea, vomiting, or diarrhea. 07-15-2022 upon evaluation today patient appears to be doing well currently in regard to his wound. He has been tolerating the dressing changes without complication. Fortunately I think that the heel is doing well except for where it is causing pressure to the heel posteriorly and this apparently is due to some kind of exercise machine that his children got him. Nonetheless I am not sure that that is going to be helpful especially if it is causing discomfort and pain. And especially if it is making the wound worse which is an even bigger deal. With regard to the patient's sacral region this is draining quite significantly. I do think a wound VAC would be ideal here and I would recommend that we go ahead and try to see about getting this ordered for him as soon as possible. The patient's wife and the patient are in agreement with this plan. 08-05-2022 upon evaluation today patient appears to be doing  well currently in regard to both his  heel and his sacral region. Unfortunately he is not eligible for a wound VAC as he is previously already undergone all the treatment that is allowable under Medicare guidelines for his sacral wound. This is quite unfortunate as I felt like that could have potentially help this speed this up but nonetheless it is where we stand. For that reason I think the [facility] is probably still the best way to continue at this point and I discussed that with the patient and his wife today. Objective Constitutional Well-nourished and well-hydrated in no acute distress. Vitals Time Taken: 8:08 AM, Height: 66 in, Weight: 279 lbs, BMI: 45, Temperature: 97.9 F, Pulse: 89 bpm, Respiratory Rate: 16 breaths/min, Blood Pressure: 177/75 mmHg. Respiratory normal breathing without difficulty. Psychiatric this patient is able to make decisions and demonstrates good insight into disease process. Alert and Oriented x 3. pleasant and cooperative. General Notes: Upon inspection patient's wound bed actually showed signs of good granulation epithelization there was some need for sharp debridement in regard to the heel and he tolerated that today without complication. Postdebridement the wound bed is significantly improved and seems to be doing excellent. Integumentary (Hair, Skin) Wound #5 status is Open. Original cause of wound was Pressure Injury. The date acquired was: 07/01/2021. The wound has been in treatment 36 weeks. The wound is located on the Right Calcaneus. The wound measures 3cm length x 2.3cm width x 0.4cm depth; 5.419cm^2 area and 2.168cm^3 volume. There is Fat Layer (Subcutaneous Tissue) exposed. There is no tunneling or undermining noted. There is a medium amount of serosanguineous drainage noted. There is medium (34-66%) red, pink granulation within the wound bed. There is a medium (34-66%) amount of necrotic tissue within the wound bed including  Adherent Slough. Wound #7 status is Open. Original cause of wound was Pressure Injury. The date acquired was: 07/01/2021. The wound has been in treatment 36 weeks. The wound is located on the Sacrum. The wound measures 3cm length x 2cm width x 3.5cm depth; 4.712cm^2 area and 16.493cm^3 volume. There is Fat Layer (Subcutaneous Tissue) exposed. There is no tunneling or undermining noted. There is a medium amount of serosanguineous drainage noted. There is large (67- 100%) red granulation within the wound bed. There is no necrotic tissue within the wound bed. Assessment Active Problems ICD-10 Pressure ulcer of right heel, stage 3 Pressure ulcer of left heel, stage 3 Pressure ulcer of sacral region, stage 4 Irritant contact dermatitis due to friction or contact with body fluids, unspecified Non-pressure chronic ulcer of buttock with fat layer exposed Muscle weakness (generalized) Bipolar disorder, unspecified Type 2 diabetes mellitus with other skin ulcer Essential (primary) hypertension Long term (current) use of anticoagulants Personal history of traumatic brain injury Depner, [name] ([id number]) [id number] Page 9 of 10 Procedures Wound #5 Pre-procedure diagnosis of Wound #5 is a Pressure Ulcer located on the Right Calcaneus . There was a Excisional Skin/Subcutaneous Tissue Debridement with a total area of 6.9 sq cm performed by Tommie Sams., PA-C. With the following instrument(s): Curette to remove Viable and Non-Viable tissue/material. Material removed includes Callus, Subcutaneous Tissue, Slough, Skin: Dermis, and Skin: Epidermis. No specimens were taken. A time out was conducted at 08:27, prior to the start of the procedure. A Moderate amount of bleeding was controlled with Pressure. The procedure was tolerated well with a pain level of 0 throughout and a pain level of 0 following the procedure. Post Debridement Measurements: 3cm length x 2.3cm width x  0.4cm depth; 2.168cm^3 volume. Post debridement  Stage noted as Category/Stage III. Character of Wound/Ulcer Post Debridement is improved. Post procedure Diagnosis Wound #5: Same as Pre-Procedure Plan Follow-up Appointments: Return Appointment in 3 weeks. Home Health: [facility] for [facility] care. May utilize formulary equivalent dressing for wound treatment orders unless otherwise specified. Home Health Nurse may visit PRN to address patientoos wound care needs. Lajean Manes [phone] Scheduled days for dressing changes to be completed; exception, patient has scheduled wound care visit that day. **Please direct any NON-WOUND related issues/requests for orders to patient's Primary Care Physician. **If current dressing causes regression in wound condition, may D/C ordered dressing product/s and apply Normal Saline Moist Dressing daily until next [place] or Other MD appointment. **Notify Wound Healing Center of regression in wound condition at [phone]. Bathing/ Shower/ Hygiene: No tub bath. Anesthetic (Use 'Patient Medications' Section for Anesthetic Order Entry): Lidocaine applied to wound bed Edema Control - Lymphedema / Segmental Compressive Device / Other: Elevate, Exercise Daily and Avoid Standing for Long Periods of Time. Elevate legs to the level of the heart and pump ankles as often as possible Elevate leg(s) parallel to the floor when sitting. DO YOUR BEST to sleep in the bed at night. DO NOT sleep in your recliner. Long hours of sitting in a recliner leads to swelling of the legs and/or potential wounds on your backside. Off-Loading: Gel wheelchair cushion Low air-loss mattress (Group 2) - Sleep in bed every night. Turn and reposition every 2 hours - keep pressure off of the sacrum and heels wounds Other: - PRAFO boot in bed keep pressure off of sacrum/gluteus and heels- Additional Orders / Instructions: Follow Nutritious Diet and Increase Protein  Intake WOUND #5: - Calcaneus Wound Laterality: Right Prim Dressing: Hydrofera Blue Ready Transfer Foam, 2.5x2.5 (in/in) 3 x Per Week/30 Days ary Discharge Instructions: Apply Hydrofera Blue Ready to wound bed as directed Secondary Dressing: (BORDER) Zetuvit Plus SILICONE BORDER Dressing 4x4 (in/in) 3 x Per Week/30 Days Discharge Instructions: Please do not put silicone bordered dressings under wraps. Use non-bordered dressing only. WOUND #7: - Sacrum Wound Laterality: Topical: calmoseptine 1 x Per Day/30 Days Discharge Instructions: apply to excorated areas Prim Dressing: Hydrofera Blue Ready Transfer Foam, 4x5 (in/in) 1 x Per Day/30 Days ary Discharge Instructions: spiral hydrofera blue to lightly pack into wound, then cut to cover wound Secondary Dressing: (BORDER) Zetuvit Plus SILICONE BORDER Dressing 4x4 (in/in) 1 x Per Day/30 Days Discharge Instructions: Please do not put silicone bordered dressings under wraps. Use non-bordered dressing only. 1. I am going to suggest that we have the patient continue with the [facility] which I think is still going to be the best way to go and the patient voiced understanding as did his wife. 2. Were also going to continue to monitor for any signs of infection or worsening. Obviously if anything changes they know to contact the office and let me know as soon as possible. We will see patient back for reevaluation in 3 weeks here in the clinic. If anything worsens or changes patient will contact our office for additional recommendations. Electronic Signature(s) Signed: 08/05/2022 6:06:42 PM By: [name] PA-C Entered By: [name] on 08/05/2022 18:06:42 Lunn, [name] ([id number]) [id number] Page 10 of 10 -------------------------------------------------------------------------------- SuperBill Details Patient Name: Date of Service: [name] [facility] 08/05/2022 Medical Record Number:  [id number] Patient Account Number: [id number] Date of Birth/Sex: Treating RN: 11/13/1955 (66 y.o. [name]) [name] Primary Care Provider: [name] Other Clinician: Referring Provider: Treating Provider/Extender: Joaquim Lai,  Renea Ee, Jeneen Rinks Weeks in Treatment: 36 Diagnosis Coding ICD-10 Codes Code Description L89.613 Pressure ulcer of right heel, stage 3 L89.623 Pressure ulcer of left heel, stage 3 L89.154 Pressure ulcer of sacral region, stage 4 L24.A0 Irritant contact dermatitis due to friction or contact with body fluids, unspecified L98.412 Non-pressure chronic ulcer of buttock with fat layer exposed M62.81 Muscle weakness (generalized) F31.9 Bipolar disorder, unspecified E11.622 Type 2 diabetes mellitus with other skin ulcer I10 Essential (primary) hypertension Z79.01 Long term (current) use of anticoagulants 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 Facility Procedures : CPT4 Code: [id number] Description: [facility] - DEB SUBQ TISSUE 20 SQ CM/< ICD-10 Diagnosis Description L89.613 Pressure ulcer of right heel, stage 3 Modifier: Quantity: 1 Physician Procedures : CPT4 Code Description Modifier [id number] - WC PHYS SUBQ TISS 20 SQ CM ICD-10 Diagnosis Description L89.613 Pressure ulcer of right heel, stage 3 Quantity: 1 Electronic Signature(s) Signed: 08/07/2022 4:45:24 PM By: [name], BSN, RN, CWS, Kim RN, BSN Signed: 08/08/2022 6:29:11 PM By: [name] PA-C Previous Signature: 08/05/2022 6:07:21 PM Version By: [name] PA-C Previous Signature: 08/05/2022 10:09:53 AM Version By: [name] RN Entered By: [name], BSN, RN, CWS, Kim on 08/07/2022 16:45:23

## 2022-08-13 ENCOUNTER — Ambulatory Visit (INDEPENDENT_AMBULATORY_CARE_PROVIDER_SITE_OTHER): Payer: Medicare Other | Admitting: Physician Assistant

## 2022-08-13 VITALS — BP 121/78 | HR 80 | Wt 251.8 lb

## 2022-08-13 DIAGNOSIS — N4889 Other specified disorders of penis: Secondary | ICD-10-CM | POA: Diagnosis not present

## 2022-08-13 DIAGNOSIS — N319 Neuromuscular dysfunction of bladder, unspecified: Secondary | ICD-10-CM | POA: Diagnosis not present

## 2022-08-13 DIAGNOSIS — T83518A Infection and inflammatory reaction due to other urinary catheter, initial encounter: Secondary | ICD-10-CM | POA: Diagnosis not present

## 2022-08-13 DIAGNOSIS — N390 Urinary tract infection, site not specified: Secondary | ICD-10-CM | POA: Diagnosis not present

## 2022-08-13 NOTE — Progress Notes (Signed)
08/13/2022 9:15 AM   [name] [birth year] [id number]  CC: Chief Complaint  Patient presents with   Follow-up    HPI: [name] is a 67 y.o. 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 He is accompanied today by his spouse, who contributes to HPI.  Today he reports no hospitalizations since his last clinic visit.  His Foley catheter is exchanged monthly by home health nursing.   [name] has noticed some blue discoloration of his drainage bag, that slightly improved after he was recently treated for non-urologic infection. He also had some penoscrotal edema last month that was accompanied by purulent discharge; this has resolved but she noticed recurrent swelling this week without discharge.  PMH: Past Medical History:  Diagnosis Date   Bipolar disorder ([place])    Diabetes mellitus without complication ([place])    History of blood clots    Hypertension    TBI (traumatic brain injury) ([place])     Surgical History: Past Surgical History:  Procedure Laterality Date   BACK SURGERY     CARPAL TUNNEL RELEASE Bilateral    COLON SURGERY     COLOSTOMY     IR PERC TUN PERIT CATH WO PORT S&I /IMAG  10/11/2021   IR REMOVAL TUN CV CATH W/O FL  11/19/2021   IR US GUIDE VASC ACCESS RIGHT  10/11/2021   TONSILLECTOMY      Home Medications:  Allergies as of 08/13/2022   No Known Allergies      Medication List        Accurate as of August 13, 2022  9:15 AM. If you have any questions, ask your nurse or doctor.          amLODipine-olmesartan 5-20 MG tablet Commonly known as: AZOR Take 1 tablet by mouth daily.   Bactrim DS 800-160 MG tablet Generic drug: sulfamethoxazole-trimethoprim Take 1 tablet by mouth 2 (two) times daily.   cyanocobalamin 1000 MCG tablet Commonly known as: VITAMIN B12 Take 3,000 mcg by mouth daily.    Dexcom G7 Receiver Devi UAD for blood sugar monitoring.   Eliquis 5 MG Tabs tablet Generic drug: apixaban Take 5 mg by mouth 2 (two) times daily.   fish oil-omega-3 fatty acids 1000 MG capsule SMARTSIG:1 Capsule(s) By Mouth Daily   glipiZIDE 2.5 MG 24 hr tablet Commonly known as: GLUCOTROL XL Take 1 tablet (2.5 mg total) by mouth daily with breakfast.   memantine 10 MG tablet Commonly known as: NAMENDA Take 10 mg by mouth 2 (two) times daily.   Oxcarbazepine 300 MG tablet Commonly known as: TRILEPTAL Take 1 tablet (300 mg total) by mouth 2 (two) times daily.   QUEtiapine 50 MG tablet Commonly known as: SEROQUEL TAKE 1/2 TO 1 (ONE-HALF TO ONE) TABLET BY MOUTH ONCE DAILY PRN  AGITATION   rosuvastatin 20 MG tablet Commonly known as: CRESTOR Take 20 mg by mouth daily.   vitamin D3 25 MCG (1000 UT) tablet Generic drug: Cholecalciferol Take 1 tablet (1,000 Units total) by mouth daily.        Allergies:  No Known Allergies  Family History: Family History  Problem Relation Age of Onset   Depression Father    Deep vein thrombosis Father     Social History:   reports that he has never smoked. He has never been exposed to tobacco smoke. He has never used smokeless tobacco. He reports that he does  not currently use alcohol. He reports that he does not use drugs.  Physical Exam: BP 121/78   Pulse 80   Wt 251 lb 12.8 oz (114.2 kg)   BMI 40.64 kg/m   Constitutional:  Alert, no acute distress, nontoxic appearing HEENT: [name], AT Cardiovascular: No clubbing, cyanosis, or edema Respiratory: Normal respiratory effort, no increased work of breathing GU: Penile edema, no scrotal edema. No purulence, crepitus, or malodor. No paraphimosis. Skin: Serosanguinous drainage from chronic sacral wound Neurologic: In wheelchair, requires heavy assist for transfers Psychiatric: Normal mood and affect  Assessment & Plan:   1. Neurogenic bladder Well managed with chronic indwelling  Foley. We discussed SP tube today and they would like to defer this. Will continue to monitor.  2. Penile edema No evidence of cellulitis on exam today. Likely dependent edema. We discussed return precautions including worse swelling, pain, and purulent discharge.  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   Return in about 6 months (around 02/11/2023) for Routine f/u with Dr. Diamantina Providence.  Debroah Loop, PA-C  [facility] [address] [place], [place] [address] [phone]

## 2022-08-26 ENCOUNTER — Encounter: Payer: Medicare Other | Admitting: Physician Assistant

## 2022-08-26 DIAGNOSIS — E11622 Type 2 diabetes mellitus with other skin ulcer: Secondary | ICD-10-CM | POA: Diagnosis not present

## 2022-08-26 NOTE — Progress Notes (Signed)
ONEY, DUTSON ([id number]) [id number] Page 1 of 2 Visit Report for 08/26/2022 Chief Complaint Document Details Patient Name: Date of Service: Jhonnie Garner [facility] 08/26/2022 8:00 [place] Record Number: [id number] Patient Account Number: [id number] Date of Birth/Sex: Treating RN: [birth year] (66 y.o. [name]) [name] Primary Care Provider: [name] Other Clinician: Referring Provider: Treating Provider/Extender: [name]: 39 Information Obtained from: Patient Chief Complaint Sacral, right gluteal, and bilateral heel ulcers Electronic Signature(s) Signed: 08/26/2022 8:21:24 AM By: [name] PA-C Entered By: [name] on 08/26/2022 [id number] -------------------------------------------------------------------------------- Problem List Details Patient Name: Date of Service: Jhonnie Garner [facility] 08/26/2022 8:00 [place] Record Number: [id number] Patient Account Number: [id number] Date of Birth/Sex: Treating RN: 1956-02-27 (66 y.o. 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 heel, stage 3 11/22/2021 No Yes L89.623 Pressure ulcer of left heel, stage 3 11/22/2021 No Yes L89.154 Pressure ulcer of sacral region, stage 4 11/22/2021 No Yes Wissinger, [name] ([id number]) [id number] Page 2 of 2 L24.A0 Irritant contact dermatitis due to friction or contact with body 11/22/2021 No Yes fluids, unspecified L98.412 Non-pressure chronic ulcer of buttock with fat layer exposed 11/22/2021 No Yes M62.81 Muscle weakness (generalized) 11/22/2021 No Yes F31.9 Bipolar disorder, unspecified 11/22/2021 No Yes E11.622 Type 2 diabetes mellitus  with other skin ulcer 11/22/2021 No Yes I10 Essential (primary) hypertension 11/22/2021 No Yes Z79.01 Long term (current) use of anticoagulants 11/22/2021 No Yes 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

## 2022-08-27 NOTE — Progress Notes (Signed)
[name] ([id number]) [id number] Page 1 of 10 Visit Report for 08/26/2022 Arrival Information Details Patient Name: Date of Service: [name] [name] 08/26/2022 8:00 [place] Record Number: [id number] Patient Account Number: [id number] Date of Birth/Sex: Treating RN: 11-01-1955 (66 y.o. [name]) [name] Primary Care [name]: [name] Other Clinician: Referring [name]: Treating [name]/Extender: [name]: 39 Visit Information History Since Last Visit Added or deleted any medications: No Patient Arrived: Wheel Chair Any new allergies or adverse reactions: No Arrival Time: 08:14 Had a fall or experienced change in No Accompanied By: wife activities of daily living that may affect Transfer Assistance: Manual risk of falls: Patient Identification Verified: Yes Signs or symptoms of abuse/neglect since last visito No Secondary Verification Process Completed: Yes Hospitalized since last visit: No Patient Requires Transmission-Based Precautions: No Implantable device outside of the clinic excluding No Patient Has Alerts: Yes cellular tissue based products placed in the [name] Patient Alerts: Patient on Blood Thinner since last visit: Has Dressing in Place as Prescribed: Yes Pain Present Now: No Electronic Signature(s) Signed: 08/26/2022 4:28:51 PM By: [name] RN Entered By: [name] on 08/26/2022 08:20:28 -------------------------------------------------------------------------------- Clinic Level of Care Assessment Details Patient Name: Date of Service: [name] [name] 08/26/2022 8:00 [place] Record Number: [id number] Patient Account Number: [id number] Date of Birth/Sex: Treating RN: 1955-07-29 (66 y.o. [name]) [name] Primary Care [name]: [name] Other Clinician: Referring [name]: Treating [name]/Extender: [name] in  Treatment: 39 Clinic Level of Care Assessment Items TOOL 1 Quantity Score '[]'$  - 0 Use when EandM and Procedure is performed on INITIAL visit ASSESSMENTS - Nursing Assessment / Reassessment '[]'$  - 0 General Physical Exam (combine w/ comprehensive assessment (listed just below) when performed on new pt. evals) '[]'$  - 0 Comprehensive Assessment (HX, ROS, Risk Assessments, Wounds Hx, etc.) Castile, [name] ([id number]) [id number] Page 2 of 10 ASSESSMENTS - Wound and Skin Assessment / Reassessment '[]'$  - 0 Dermatologic / Skin Assessment (not related to wound area) ASSESSMENTS - Ostomy and/or Continence Assessment and Care '[]'$  - 0 Incontinence Assessment and Management '[]'$  - 0 Ostomy Care Assessment and Management (repouching, etc.) PROCESS - Coordination of Care '[]'$  - 0 Simple Patient / Family Education for ongoing care '[]'$  - 0 Complex (extensive) Patient / Family Education for ongoing care '[]'$  - 0 Staff obtains Programmer, systems, Records, T Results / Process Orders est '[]'$  - 0 Staff telephones HHA, Nursing Homes / Clarify orders / etc '[]'$  - 0 Routine Transfer to another Facility (non-emergent condition) '[]'$  - 0 Routine Hospital Admission (non-emergent condition) '[]'$  - 0 New Admissions / Biomedical engineer / Ordering NPWT Apligraf, etc. , '[]'$  - 0 Emergency Hospital Admission (emergent condition) PROCESS - Special Needs '[]'$  - 0 Pediatric / Minor Patient Management '[]'$  - 0 Isolation Patient Management '[]'$  - 0 Hearing / Language / Visual special needs '[]'$  - 0 Assessment of Community assistance (transportation, D/C planning, etc.) '[]'$  - 0 Additional assistance / Altered mentation '[]'$  - 0 Support Surface(s) Assessment (bed, cushion, seat, etc.) INTERVENTIONS - Miscellaneous '[]'$  - 0 External ear exam '[]'$  - 0 Patient Transfer (multiple staff / Civil Service fast streamer / Similar devices) '[]'$  - 0 Simple Staple / Suture removal (25 or less) '[]'$  - 0 Complex Staple / Suture removal (26  or more) '[]'$  - 0 Hypo/Hyperglycemic Management (do not check if billed separately) '[]'$  - 0 Ankle / Brachial Index (ABI) - do not check if billed separately Has the patient been seen  at the hospital within the last three years: Yes Total Score: 0 Level Of Care: ____ Electronic Signature(s) Signed: 08/26/2022 4:28:51 PM By: [name] RN Entered By: [name] on 08/26/2022 08:29:30 -------------------------------------------------------------------------------- Encounter Discharge Information Details Patient Name: Date of Service: [name] [name] 08/26/2022 8:00 [place] Record Number: [id number] Patient Account Number: [id number] Date of Birth/Sex: Treating RN: 06-29-1956 (66 y.o. [name]) [name] Primary Care [name]: [name] Other Clinician: Referring [name]: Treating [name]/Extender: [name]: [address] [place] ([id number]) [id number] Page 3 of 10 Encounter Discharge Information Items Post Procedure Vitals Discharge Condition: Stable Temperature (F): 98 Ambulatory Status: Ambulatory Pulse (bpm): 78 Discharge Destination: Home Respiratory Rate (breaths/min): 16 Transportation: Private Auto Blood Pressure (mmHg): 154/87 Accompanied By: wife Schedule Follow-up Appointment: Yes Clinical Summary of Care: Electronic Signature(s) Signed: 08/26/2022 4:28:51 PM By: [name] RN Entered By: [name] on 08/26/2022 08:30:19 -------------------------------------------------------------------------------- Lower Extremity Assessment Details Patient Name: Date of Service: [name] [name] 08/26/2022 8:00 [place] Record Number: [id number] Patient Account Number: [id number] Date of Birth/Sex: Treating RN: 10/16/1955 (66 y.o. [name]) [name] Primary Care Solaris Kram: [name] Other Clinician: Referring Harlowe Dowler: Treating Randall Rampersad/Extender: [name]: 39 Edema Assessment Assessed: Shirlyn Goltz: Yes] [name]: Yes] [Left: Edema] [Right: :] Electronic Signature(s) Signed: 08/26/2022 4:28:51 PM By: [name] RN Entered By: [name] on 08/26/2022 08:23:39 -------------------------------------------------------------------------------- Multi Wound Chart Details Patient Name: Date of Service: [name] [facility] 08/26/2022 8:00 [place] Record Number: [id number] Patient Account Number: [id number] Date of Birth/Sex: Treating RN: 1955/09/15 (66 y.o. [name] Primary Care Alena Blankenbeckler: [name] Other Clinician: Referring Gavinn Collard: Treating Marshawn Ninneman/Extender: [name]: 39 Vital Signs Height(in): 66 Pulse(bpm): 78 Weight(lbs): 279 Blood Pressure(mmHg): 154/87 Body Mass Index(BMI): 45 Temperature(F): 98 Hosea, [name] ([id number]) [id number] Page 4 of 10 Respiratory Rate(breaths/min): 16 [5:Photos:] [7:No Photos] [N/A:N/A] Right Calcaneus Sacrum N/A Wound Location: Pressure Injury Pressure Injury N/A Wounding Event: Pressure Ulcer Pressure Ulcer N/A Primary Etiology: Anemia, Sleep Apnea, Coronary Artery Anemia, Sleep Apnea, Coronary Artery N/A Comorbid History: Disease, Deep Vein Thrombosis, Disease, Deep Vein Thrombosis, Hypertension, Type II Diabetes, Hypertension, Type II Diabetes, History of pressure wounds, History of pressure wounds, Neuropathy Neuropathy 07/01/2021 07/01/2021 N/A Date Acquired: 70 39 N/A Weeks of Treatment: Open Open N/A Wound Status: No No N/A Wound Recurrence: 2x1.5x0.3 3x2x3 N/A Measurements L x W x D (cm) 2.356 4.712 N/A A (cm) : rea 0.707 14.137 N/A Volume (cm) : 43.10% -50.00% N/A % Reduction in A rea: -70.80% -50.00% N/A % Reduction in Volume: Category/Stage III Category/Stage IV N/A Classification: Medium Medium N/A Exudate A mount: Serosanguineous Serosanguineous  N/A Exudate Type: red, brown red, brown N/A Exudate Color: Medium (34-66%) Large (67-100%) N/A Granulation A mount: Red, Pink Red N/A Granulation Quality: Medium (34-66%) None Present (0%) N/A Necrotic A mount: Fat Layer (Subcutaneous Tissue): Yes Fat Layer (Subcutaneous Tissue): Yes N/A Exposed Structures: Fascia: No Fascia: No Tendon: No Tendon: No Muscle: No Muscle: No Joint: No Joint: No Bone: No Bone: No None None N/A Epithelialization: Treatment Notes Electronic Signature(s) Signed: 08/26/2022 4:28:51 PM By: [name] RN Entered By: [name] on 08/26/2022 08:23:44 -------------------------------------------------------------------------------- Multi-Disciplinary Care Plan Details Patient Name: Date of Service: [name] [name] 08/26/2022 8:00 [place] Record Number: [id number] Patient Account Number: [id number] Date of Birth/Sex: Treating RN: 25-Aug-1955 (66 y.o. [name] Primary Care Bonifacio Pruden: [name] Other Clinician: Referring Takyia Sindt: Treating  Roddy Bellamy/Extender: [name]: [address], [place] ([id number]) [id number] Page 5 of 10 Wound/Skin Impairment Nursing Diagnoses: Knowledge deficit related to ulceration/compromised skin integrity Goals: Patient/caregiver will verbalize understanding of skin care regimen Date Initiated: 11/22/2021 Target Resolution Date: 08/25/2022 Goal Status: Active Ulcer/skin breakdown will have a volume reduction of 30% by week 4 Date Initiated: 11/22/2021 Date Inactivated: 01/10/2022 Target Resolution Date: 12/23/2021 Goal Status: Unmet Unmet Reason: comorbities Ulcer/skin breakdown will have a volume reduction of 50% by week 8 Date Initiated: 11/22/2021 Date Inactivated: 05/07/2022 Target Resolution Date: 01/22/2022 Goal Status: Unmet Unmet Reason: comorbidities Ulcer/skin breakdown will have a volume reduction  of 80% by week 12 Date Initiated: 11/22/2021 Date Inactivated: 05/07/2022 Target Resolution Date: 02/22/2022 Goal Status: Unmet Unmet Reason: comorbidities Ulcer/skin breakdown will heal within 14 weeks Date Initiated: 11/22/2021 Date Inactivated: 05/07/2022 Target Resolution Date: 03/25/2022 Goal Status: Unmet Unmet Reason: comorbidities Interventions: Assess patient/caregiver ability to obtain necessary supplies Assess patient/caregiver ability to perform ulcer/skin care regimen upon admission and as needed Assess ulceration(s) every visit Notes: Electronic Signature(s) Signed: 08/26/2022 4:28:51 PM By: [name] RN Entered By: [name] on 08/26/2022 08:23:51 -------------------------------------------------------------------------------- Pain Assessment Details Patient Name: Date of Service: [name] [facility] 08/26/2022 8:00 [place] Number: [id number] Patient Account Number: [id number] Date of Birth/Sex: Treating RN: 04/23/1956 (66 y.o. [name] Primary Care Baylin Gamblin: [name] Other Clinician: Referring Jalayiah Bibian: Treating Diyana Starrett/Extender: [name]: 39 Active Problems Location of Pain Severity and Description of Pain Patient Has Paino No Site Locations [place], [place] ([id number]) [id number] Page 6 of 10 Pain Management and Medication Current Pain Management: Electronic Signature(s) Signed: 08/26/2022 4:28:51 PM By: [name] RN Entered By: [name] on 08/26/2022 08:20:52 -------------------------------------------------------------------------------- Patient/Caregiver Education Details Patient Name: Date of Service: [name] [name] 2/26/2024andnbsp8:00 [place] Number: [id number] Patient Account Number: [id number] Date of Birth/Gender: Treating RN: [birth year] (66 y.o. [name]) [name] Primary Care Physician: [name] Other  Clinician: Referring Physician: Treating Physician/Extender: [name]: 39 Education Assessment Education Provided To: Patient Education Topics Provided Wound/Skin Impairment: Methods: Explain/Verbal Responses: State content correctly Electronic Signature(s) Signed: 08/26/2022 4:28:51 PM By: [name] RN Entered By: [name] on 08/26/2022 08:24:02 Speece, [name] ([id number] Page 7 of 10 -------------------------------------------------------------------------------- Wound Assessment Details Patient Name: Date of Service: [name] [facility] 08/26/2022 8:00 A M Medical Record Number: [id number] Patient Account Number: [id number] Date of Birth/Sex: Treating RN: [birth year] (66 y.o. [name]) [name] Primary Care Amerika Nourse: [name] Other Clinician: Referring Dari Carpenito: Treating Lucille Witts/Extender: [name]: 39 Wound Status Wound Number: 5 Primary Pressure Ulcer Etiology: Wound Location: Right Calcaneus Wound Open Wounding Event: Pressure Injury Status: Date Acquired: 07/01/2021 Comorbid Anemia, Sleep Apnea, Coronary Artery Disease, Deep Vein Weeks Of Treatment: 39 History: Thrombosis, Hypertension, Type II Diabetes, History of pressure Clustered Wound: No wounds, Neuropathy Photos Wound Measurements Length: (cm) 2 Width: (cm) 1.5 Depth: (cm) 0.3 Area: (cm) 2.356 Volume: (cm) 0.707 % Reduction in Area: 43.1% % Reduction in Volume: -70.8% Epithelialization: None Tunneling: No Undermining: No Wound Description Classification: Category/Stage III Exudate Amount: Medium Exudate Type: Serosanguineous Exudate Color: red, brown Foul Odor After Cleansing: No Slough/Fibrino Yes Wound Bed Granulation Amount: Medium (34-66%) Exposed Structure Granulation Quality: Red, Pink Fascia Exposed: No Necrotic Amount: Medium (34-66%) Fat Layer  (Subcutaneous Tissue) Exposed: Yes Necrotic Quality: Adherent Slough Tendon Exposed: No Muscle Exposed: No Joint Exposed: No  Bone Exposed: No Treatment Notes Wound #5 (Calcaneus) Wound Laterality: Right Cleanser Peri-Wound Care Topical Doepke, [name] ([id number]) [phone] Page 8 of 10 Primary Dressing Hydrofera Blue Ready Transfer Foam, 2.5x2.5 (in/in) Discharge Instruction: Apply Hydrofera Blue Ready to wound bed as directed Secondary Dressing (BORDER) Zetuvit Plus SILICONE BORDER Dressing 4x4 (in/in) Discharge Instruction: Please do not put silicone bordered dressings under wraps. Use non-bordered dressing only. Secured With Compression Wrap Compression Stockings Environmental education officer) Signed: 08/26/2022 4:28:51 PM By: [name] RN Entered By: [name] on 08/26/2022 08:22:38 -------------------------------------------------------------------------------- Wound Assessment Details Patient Name: Date of Service: [name] [facility] 08/26/2022 8:00 A M Medical Record Number: [id number] Patient Account Number: [id number] Date of Birth/Sex: Treating RN: 11/13/1955 (66 y.o. [name]) [name] Primary Care Deago Burruss: [name] Other Clinician: Referring Cordie Beazley: Treating Sarahgrace Broman/Extender: [name]: 39 Wound Status Wound Number: 7 Primary Pressure Ulcer Etiology: Wound Location: Sacrum Wound Open Wounding Event: Pressure Injury Status: Date Acquired: 07/01/2021 Comorbid Anemia, Sleep Apnea, Coronary Artery Disease, Deep Vein Weeks Of Treatment: 39 History: Thrombosis, Hypertension, Type II Diabetes, History of pressure Clustered Wound: No wounds, Neuropathy Photos Wound Measurements Length: (cm) 3 Width: (cm) 2 Depth: (cm) 3 Area: (cm) 4.712 Volume: (cm) 14.137 % Reduction in Area: -50% % Reduction in Volume: -50% Epithelialization: None Tunneling: No Undermining:  No Wound Description Classification: Category/Stage IV Exudate Amount: Medium Tarazon, [name] ([id number]) Exudate Type: Serosanguineous Exudate Color: red, brown Foul Odor After Cleansing: No Slough/Fibrino Yes [phone] Page 9 of 10 Wound Bed Granulation Amount: Large (67-100%) Exposed Structure Granulation Quality: Red Fascia Exposed: No Necrotic Amount: None Present (0%) Fat Layer (Subcutaneous Tissue) Exposed: Yes Tendon Exposed: No Muscle Exposed: No Joint Exposed: No Bone Exposed: No Treatment Notes Wound #7 (Sacrum) Cleanser Peri-Wound Care Topical calmoseptine Discharge Instruction: apply to excorated areas Primary Dressing Hydrofera Blue Ready Transfer Foam, 4x5 (in/in) Discharge Instruction: spiral hydrofera blue to lightly pack into wound, then cut to cover wound Secondary Dressing (BORDER) Zetuvit Plus SILICONE BORDER Dressing 4x4 (in/in) Discharge Instruction: Please do not put silicone bordered dressings under wraps. Use non-bordered dressing only. Secured With Compression Wrap Compression Stockings Environmental education officer) Signed: 08/26/2022 4:28:51 PM By: [name] RN Entered By: [name] on 08/26/2022 08:23:05 -------------------------------------------------------------------------------- Vitals Details Patient Name: Date of Service: [name] [facility] 08/26/2022 8:00 [place] Record Number: [id number] Patient Account Number: [id number] Date of Birth/Sex: Treating RN: 1955/11/02 (66 y.o. [name]) [name] Primary Care Tenee Wish: [name] Other Clinician: Referring Amarisa Wilinski: Treating Trude Cansler/Extender: [name]: 39 Vital Signs Time Taken: 08:15 Temperature (F): 98 Height (in): 66 Pulse (bpm): 78 Weight (lbs): 279 Respiratory Rate (breaths/min): 16 Body Mass Index (BMI): 45 Blood Pressure (mmHg): 154/87 Reference Range: 80 - 120 mg /  dl Electronic Signature(s) [place], Rajeev ([id number]) [phone].pdf Page 10 of 10 Signed: 08/26/2022 4:28:51 PM By: [name] RN Entered By: [name] on 08/26/2022 08:20:46

## 2022-09-16 ENCOUNTER — Encounter: Payer: Medicare Other | Attending: Physician Assistant | Admitting: Physician Assistant

## 2022-09-16 DIAGNOSIS — M6281 Muscle weakness (generalized): Secondary | ICD-10-CM | POA: Diagnosis not present

## 2022-09-16 DIAGNOSIS — L89154 Pressure ulcer of sacral region, stage 4: Secondary | ICD-10-CM | POA: Insufficient documentation

## 2022-09-16 DIAGNOSIS — L89623 Pressure ulcer of left heel, stage 3: Secondary | ICD-10-CM | POA: Diagnosis not present

## 2022-09-16 DIAGNOSIS — Z87820 Personal history of traumatic brain injury: Secondary | ICD-10-CM | POA: Diagnosis not present

## 2022-09-16 DIAGNOSIS — L98412 Non-pressure chronic ulcer of buttock with fat layer exposed: Secondary | ICD-10-CM | POA: Insufficient documentation

## 2022-09-16 DIAGNOSIS — L24A0 Irritant contact dermatitis due to friction or contact with body fluids, unspecified: Secondary | ICD-10-CM | POA: Diagnosis not present

## 2022-09-16 DIAGNOSIS — E11622 Type 2 diabetes mellitus with other skin ulcer: Secondary | ICD-10-CM | POA: Insufficient documentation

## 2022-09-16 DIAGNOSIS — L89613 Pressure ulcer of right heel, stage 3: Secondary | ICD-10-CM | POA: Diagnosis not present

## 2022-09-16 DIAGNOSIS — F319 Bipolar disorder, unspecified: Secondary | ICD-10-CM | POA: Insufficient documentation

## 2022-09-16 DIAGNOSIS — I10 Essential (primary) hypertension: Secondary | ICD-10-CM | POA: Diagnosis not present

## 2022-09-16 DIAGNOSIS — Z7901 Long term (current) use of anticoagulants: Secondary | ICD-10-CM | POA: Insufficient documentation

## 2022-09-16 NOTE — Progress Notes (Addendum)
[name], [name] ([name]) [id number] Page 1 of 9 Visit Report for [name] Chief Complaint Document Details Patient Name: Date of Service: [name] [facility] [name] 8:00 [name] Record Number: [name] Patient Account Number: [name] Date of Birth/Sex: Treating RN: [name] (66 y.o. [name]) [name] Primary Care Provider: Lavone [name] Other Clinician: Referring Provider: Treating Provider/Extender: [name] in Treatment: 42 Information Obtained from: Patient Chief Complaint Sacral, right gluteal, and bilateral heel ulcers Electronic Signature(s) Signed: 09/16/2022 8:12:16 AM By: [name] Keeler PA-C Entered By: [name] on 09/16/2022 08:12:16 -------------------------------------------------------------------------------- HPI Details Patient Name: Date of Service: [name] [facility] [name] 8:00 [place] Record Number: [name] Patient Account Number: [name] Date of Birth/Sex: Treating RN: [name] (66 y.o. [name]) [name] Primary Care Provider: Lavone [name] Other Clinician: Referring Provider: Treating Provider/Extender: [name] in Treatment: 42 History of Present Illness HPI Description: [name] this is a patient who presents today for initial inspection here in the clinic concerning wounds that he has over the right heel and the sacral region. Unfortunately the sacral wound is starting to spread off to the right gluteal location due to how he sits always leaning towards the right side in his chair. His wife is present she is the primary caregiver though she is not home with him all the time she does have to work. She does do an excellent job however it appears to be in trying to keep things under good control for him. The patient is not able to change positions himself nor walk by himself so he is pretty much at the mercy of the  position he is putting when she is gone and this tends to be his chair which she sits and most of the day. Obviously this I think is the main culprit for what is going on currently. It was actually in [name] when the sacral wound started. It was in [name] when the wound started to spread more to the right gluteal location. Subsequently in [name] is when he had been in a skilled nursing facility and the heel started to give him trouble as well. That does not seem to be doing nearly as poorly as the sacral region. He was hospitalized in [name] secondary to sepsis and this was in regard to the foot and was sent to skilled nursing again he is now back at home. He did have a wound VAC for the sacral wound over the summer [name] but being in and out of facilities this ended up getting sent back. The patient does have [name] home health that comes out 1 time per week to help with care. His most recent hemoglobin A1c was 6.9 in [name]. 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 s. [name], [name] ([name]) [id number] Page 2 of 9 07/24/2021 upon evaluation today patient appears to be doing decently well in regard to the pressure ulcer on the right heel as well as the sacral region. In general I think you are making some progress here which is great news. Overall the heel unfortunately had already closed previously when we saw him although it apparently reopened when he was working with physical therapy according to his wife. The area in the sacral region is doing well and looks clean there is still some  depth here but I still think it would be difficult to wound VAC this region. His wife does an awesome job taking care of him. Is been so  long since we have seen him because he has been in the hospital to be honest. 08/07/2021 upon evaluation today patient appears to be doing well at this time. Fortunately I do not see any signs of active infection locally or systemically at this time which is great news. No fevers, chills, nausea, vomiting, or diarrhea. Unfortunately after I saw him on the 24th he actually ended up in the hospital in the 27th due to being septic. This was not due to the wounds but after looking at records actually due to a UTI. Fortunately he is doing much better and very happy in that regard. I do not see any signs of infection locally nor systemically at this time. 08/21/2021 upon evaluation today patient appears to be doing well with regard to his wound. Fortunately I think that the sacral region is doing decently well at this point which is great news. With regard to the foot this also does have some slough noted but I feel like we are making progress here. He does have some irritation around the right upper thigh/gluteal region. I feel like it is more towards the thigh. Nonetheless this does appear to be pressure related he spends a lot of time sitting up his caregiver states he really will not get in the bed and stay there like he should. 09/04/2021 upon evaluation patient appears to be doing decently well today in regard to the wound on his heel as well as the sacral area. I am actually very pleased with both and how things are appearing currently. There does not appear to be any evidence of active infection I think that his caregiver is doing an awesome job with regard to the wound care here. She is present today as well and I did discuss this with her. 09/25/2021 upon evaluation today patient appears to be doing well with regard to his wound on the sacral region. His right heel is also doing well. Unfortunately he has a new area on his left heel which does appear to be pressure injury. I do not see any evidence of  active infection locally or systemically which is great news no fevers, chills, nausea, vomiting, or diarrhea. Readmission: [name] upon evaluation today patient presents for reevaluation here in the clinic concerning issues with his wounds. Since have last seen him he was in the hospital and then subsequently ended up in a skilled nursing facility. Subsequently period of time in the facility he actually had a breakdown in the right thigh location which has been an issue for him. Fortunately I do not see any evidence of active infection locally or systemically which is great news and I am very pleased in that regard. Nonetheless he does have still the wounds on both heels as well as the wound in the sacral area and the new area in the right upper thigh/gluteal region. 12-13-2021 upon evaluation today patient appears to be doing well currently in regard to his wounds. The left heel is going to require some sharp debridement. Fortunately the right heel seems to be doing quite well. Overall I think his gluteal region as well as sacral region also are doing well. 01-10-2022 upon evaluation today patient appears to be doing better in regard to some areas unfortunately his sacral area is not doing as well. It seems of gotten worse his  caregiver states when he was in the hospital recently they really did not offloading. Fortunately I do not see any evidence of active infection locally or systemically at this time. 02-01-2022 upon evaluation today patient appears to be doing well currently in regard to his wounds. He is going require some sharp debridement of clearway some of the necrotic debris. Fortunately I do not see any evidence of active infection locally or systemically which is great news. No fevers, chills, nausea, vomiting, or diarrhea. 02-22-2022 upon evaluation today patient appears to be doing poorly in regard to his right heel the left heel is completely closed. Unfortunately I think he is  got some deep tissue injury to this area I am going to do a culture at this spot. With that being said he is having 2 other significant issues in regard to the left thigh this is showing signs of cellulitis all the way down into the thigh and was seeing definite temperature difference in the left thigh versus the right thigh when feeling. It also is of note that the patient is also having some issues here with trouble in regard to having fevers his wife has been giving him Tylenol and ibuprofen to help keep the fevers under control but he has been as high as 102 this is pretty much been going on since Monday or Tuesday of this week. Nonetheless I am concerned I am not exactly sure where the cellulitis on the left thigh is coming from not sure if this is emanating from his sacral region that is a possibility or if there is something completely different going on either way with the fevers I am more worried that he needs to go get this checked out as soon as possible. 03-08-2022 upon evaluation today patient appears to be doing well with regard to his heel as well as sacral region. Both are showing signs of significant improvement compared to the last time I saw him. Overall I think we are in a much better spot 04-04-2022 upon evaluation today patient appears to be doing about the same in regard to his wounds. I do not see any signs of improvement nor worsening at this point. Fortunately there is no evidence of active infection at this time. He is not staying off of this is much as he needs to in fact is not even sleeping in the hospital bed with the alternating air mattress. 04-16-2022 upon evaluation today patient appears to be doing well currently in regard to his wound. He has been tolerating the dressing changes without complication. Fortunately I do not see any evidence of infection. The heel actually looks quite well the sacral area actually is still quite deep. From my questioning and discussion  with him today I do not believe that he is really staying off of this as much as he should. He seemed a little uncomfortable during that portion of the conversation today. I think that is contributing quite a bit to his lack of progress with regard to healing. 11/7; patient with decubitus ulcers on his right heel as well as his lower sacrum. We have been using Hydrofera Blue. He has home health predominantly for Foley catheter management. There have been some issues getting the Hydrofera Blue through home health but apparently the patient's wife has some supplies and is managing to get these changed.They come every 3 weeks because of transportation difficulties 05-28-2022 upon evaluation today patient appears to be doing decently well currently in regard to his wounds. He still  has a depth to the wound in the sacral region but again this does seem to be a little bit less than previous. He has been using Hydrofera Blue both on this in the heel ulcer. Both are going to require some cleaning today sharp debridement on the heel I think is cleaning with saline gauze and the sterile Q-tip is probably sufficient for the sacral area which does not appear to have too much necrotic tissue. 06-18-2022 upon evaluation today patient's wounds are showing signs of doing decently well. Fortunately I do not see any signs of active infection locally nor systemically at this time which is great news. No fevers, chills, nausea, vomiting, or diarrhea. 07-15-2022 upon evaluation today patient appears to be doing well currently in regard to his wound. He has been tolerating the dressing changes without complication. Fortunately I think that the heel is doing well except for where it is causing pressure to the heel posteriorly and this apparently is due to some kind of exercise machine that his children got him. Nonetheless I am not sure that that is going to be helpful especially if it is causing discomfort and [name].  And especially if it is making the wound worse which is an even bigger deal. With regard to the patient's sacral region this is draining quite significantly. I do think a wound VAC would be ideal here and I would recommend that we go ahead and try to see about getting this ordered for him as soon as possible. The patient's wife and the patient are in agreement with this plan. 08-05-2022 upon evaluation today patient appears to be doing well currently in regard to both his heel and his sacral region. Unfortunately he is not eligible for a wound VAC as he is previously already undergone all the treatment that is allowable under Medicare guidelines for his sacral wound. This is quite unfortunate as I felt like that could have potentially help this speed this up but nonetheless it is where we stand. For that reason I think the [facility] is probably still the best way to continue at this point and I discussed that with the patient and his wife today. 08-26-2022 upon evaluation today patient appears to be doing well currently in regard to his wound. He on the heel of his looking much better I did have to perform some debridement here. In regard to the sacral area no debridement was necessary it looks to clean this has been although there was a lot of Hydrofera Blue and there much more than is probably necessary. ROSWELL, ADORNETTO ([id number]) [id number] Page 3 of 9 09-15-2021 upon evaluation today patient appears to be doing well currently in regard to his heel ulcer which is looking okay although the gluteal region specifically the sacral wound is showing signs of some issues there. Fortunately I do not see any evidence of infection significantly to the heel although I am not so sure about the sacral region. I think he probably would benefit from initiation of antibiotics. Electronic Signature(s) Signed: 09/16/2022 8:45:25 AM By: [name] Keeler PA-C Entered By: [name] on 09/16/2022 08:45:25 -------------------------------------------------------------------------------- Physical Exam Details Patient Name: Date of Service: [name] [facility] [name] 8:00 A M Medical Record Number: [id number] Patient Account Number: [name] Date of Birth/Sex: Treating RN: [name]-[birth year] (66 y.o. [name]) [name] Primary Care Provider: Lavone [name] Other Clinician: Referring Provider: Treating Provider/Extender: [name] in Treatment: 87 Constitutional Well-nourished and well-hydrated in no acute distress. Respiratory  normal breathing without difficulty. Psychiatric this patient is able to make decisions and demonstrates good insight into disease process. Alert and Oriented x 3. pleasant and cooperative. Notes Upon inspection patient's wound bed actually showed signs of good granulation" at this point. Fortunately I do not see any signs of active infection systemically though locally not sacral region I do believe there is an issue here that needs to be addressed. Electronic Signature(s) Signed: 09/16/2022 8:45:40 AM By: [name] Keeler PA-C Entered By: [name] on 09/16/2022 08:45:39 -------------------------------------------------------------------------------- Physician Orders Details Patient Name: Date of Service: [name] [facility] [name] 8:00 [place] Record Number: [name] Patient Account Number: [name] Date of Birth/Sex: Treating RN: [birth year][name] (66 y.o. [name]) [name] Primary Care Provider: Lavone [name] Other Clinician: Referring Provider: Treating Provider/Extender: [name] in Treatment: 54 Verbal / Phone Orders: No Diagnosis Coding DENNARD, AIREY ([name]) [id number] Page 4 of 9 ICD-10 Coding Code Description L89.613 Pressure ulcer of right heel, stage 3 L89.623 Pressure ulcer of left heel, stage 3 L89.154  Pressure ulcer of sacral region, stage 4 L24.A0 Irritant contact dermatitis due to friction or contact with body fluids, unspecified L98.412 Non-pressure chronic ulcer of buttock with fat layer exposed M62.81 Muscle weakness (generalized) F31.9 Bipolar disorder, unspecified E11.622 Type 2 diabetes mellitus with other skin ulcer I10 Essential (primary) hypertension Z79.01 Long term (current) use of anticoagulants 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 [place] for wound care. May utilize formulary equivalent dressing for wound treatment orders unless otherwise specified. Home Health Nurse may visit PRN to address patients wound care needs. Lajean Manes [phone] Scheduled days for dressing changes to be completed; exception, patient has scheduled wound care visit that day. **Please direct any NON-WOUND related issues/requests for orders to patient's Primary Care Physician. **If current dressing causes regression in wound condition, may D/C ordered dressing product/s and apply Normal Saline Moist Dressing daily until next [facility] or Other MD appointment. **Notify [facility] of regression in wound condition at [phone]. Bathing/ Shower/ Hygiene No tub bath. Anesthetic (Use 'Patient Medications' Section for Anesthetic Order Entry) Lidocaine applied to wound bed Edema Control - Lymphedema / Segmental Compressive Device / Other Elevate, Exercise Daily and A void Standing for Long Periods of Time. Elevate legs to the level of the heart and pump ankles as often as possible Elevate leg(s) parallel to the floor when sitting. DO YOUR BEST to sleep in the bed at night. DO NOT sleep in your recliner. Long hours of sitting in a recliner leads to swelling of the legs and/or potential wounds on your backside. Off-Loading Gel wheelchair cushion Low air-loss mattress (Group 2) - Sleep in  bed every night. Turn and reposition every 2 hours - keep pressure off of the sacrum and heels wounds Other: - PRAFO boot in bed keep pressure off of sacrum/gluteus and heels- Additional Orders / Instructions Follow Nutritious Diet and Increase Protein Intake Wound Treatment Wound #5 - Calcaneus Wound Laterality: Right Prim Dressing: Hydrofera Blue Ready Transfer Foam, 2.5x2.5 (in/in) ary 3 x Per Week/30 Days Discharge Instructions: cut to size of wound Secondary Dressing: (BORDER) Zetuvit Plus SILICONE BORDER Dressing 4x4 (in/in) 3 x Per Week/30 Days Wound #7 - Sacrum Topical: calmoseptine 1 x Per Day/30 Days Discharge Instructions: apply to excorated areas Prim Dressing: Hydrofera Blue Ready Transfer Foam, 4x5 (in/in) ary 1 x Per Day/30 Days Discharge Instructions: pack lightly into wound Secondary  Dressing: (BORDER) Zetuvit Plus SILICONE BORDER Dressing 4x4 (in/in) 1 x Per Day/30 Days Patient Medications llergies: No Known Allergies A Notifications Medication Indication Start End [name] Bactrim DS DOSE 1 - oral 800 mg-160 mg tablet - 1 tablet oral twice a day x 14 days JAWON, ALVIDREZ ([name]) [id number] Page 5 of 9 Electronic Signature(s) Signed: 09/16/2022 8:46:49 AM By: [name] Keeler PA-C Entered By: [name] on 09/16/2022 08:46:49 -------------------------------------------------------------------------------- Problem List Details Patient Name: Date of Service: [name] [facility] [name] 8:00 [place] Record Number: [name] Patient Account Number: [name] Date of Birth/Sex: Treating RN: [name][birth year] (66 y.o. [name]) [name] Primary Care Provider: Lavone [name] Other Clinician: Referring Provider: Treating Provider/Extender: [name] in Treatment: 42 Active Problems ICD-10 Encounter Code Description Active Date MDM Diagnosis L89.613 Pressure ulcer of right heel, stage 3  11/22/2021 No Yes L89.623 Pressure ulcer of left heel, stage 3 11/22/2021 No Yes L89.154 Pressure ulcer of sacral region, stage 4 11/22/2021 No Yes L24.A0 Irritant contact dermatitis due to friction or contact with body fluids, 11/22/2021 No Yes unspecified L98.412 Non-pressure chronic ulcer of buttock with fat layer exposed 11/22/2021 No Yes M62.81 Muscle weakness (generalized) 11/22/2021 No Yes F31.9 Bipolar disorder, unspecified 11/22/2021 No Yes E11.622 Type 2 diabetes mellitus with other skin ulcer 11/22/2021 No Yes I10 Essential (primary) hypertension 11/22/2021 No Yes Z79.01 Long term (current) use of anticoagulants 11/22/2021 No Yes 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 11/22/2021 No Yes [place], [name] ([name]) [id number] Page 6 of 9 Inactive Problems Resolved Problems Electronic Signature(s) Signed: 09/16/2022 8:12:13 AM By: [name] Keeler PA-C Entered By: [name] on 09/16/2022 08:12:13 -------------------------------------------------------------------------------- Progress Note Details Patient Name: Date of Service: [name] [facility] [name] 8:00 [place] Record Number: [name] Patient Account Number: [name] Date of Birth/Sex: Treating RN: [name] 26, [name] (66 y.o. [name]) [name] Primary Care Provider: Lavone [name] Other Clinician: Referring Provider: Treating Provider/Extender: [name] in Treatment: 42 Subjective Chief Complaint Information obtained from Patient Sacral, right gluteal, and bilateral heel ulcers History of Present Illness (HPI) [name] this is a patient who presents today for initial inspection here in the clinic concerning wounds that he has over the right heel and the sacral region. Unfortunately the sacral wound is starting to spread off to the right gluteal location due to how he sits always leaning towards the right side in his chair. His wife is  present she is the primary caregiver though she is not home with him all the time she does have to work. She does do an excellent job however it appears to be in trying to keep things under good control for him. The patient is not able to change positions himself nor walk by himself so he is pretty much at the mercy of the position he is putting when she is gone and this tends to be his chair which she sits and most of the day. Obviously this I think is the main culprit for what is going on currently. It was actually in [name] when the sacral wound started. It was in [name] when the wound started to spread more to the right gluteal location. Subsequently in [name] is when he had been in a skilled nursing facility and the heel started to give him trouble as well. That does not seem to be doing nearly as poorly as the sacral region. He was hospitalized in [name] secondary to sepsis and this was  in regard to the foot and was sent to skilled nursing again he is now back at home. He did have a wound VAC for the sacral wound over the summer [name] but being in and out of facilities this ended up getting sent back. The patient does have [name] home health that comes out 1 time per week to help with care. His most recent hemoglobin A1c was 6.9 in [name]. 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 s. 07/24/2021 upon evaluation today patient appears to be doing decently well in regard to the pressure ulcer on the right heel as well as the sacral region. In general I think you are making some progress here which is great news. Overall the heel unfortunately had already closed previously when we saw him although it apparently reopened when he was working with physical therapy  according to his wife. The area in the sacral region is doing well and looks clean there is still some depth here but I still think it would be difficult to wound VAC this region. His wife does an awesome job taking care of him. Is been so long since we have seen him because he has been in the hospital to be honest. 08/07/2021 upon evaluation today patient appears to be doing well at this time. Fortunately I do not see any signs of active infection locally or systemically at this time which is great news. No fevers, chills, nausea, vomiting, or diarrhea. Unfortunately after I saw him on the 24th he actually ended up in the hospital in the 27th due to being septic. This was not due to the wounds but after looking at records actually due to a UTI. Fortunately he is doing much better and very happy in that regard. I do not see any signs of infection locally nor systemically at this time. 08/21/2021 upon evaluation today patient appears to be doing well with regard to his wound. Fortunately I think that the sacral region is doing decently well at this point which is great news. With regard to the foot this also does have some slough noted but I feel like we are making progress here. He does have some irritation around the right upper thigh/gluteal region. I feel like it is more towards the thigh. Nonetheless this does appear to be pressure related he spends a lot of time sitting up his caregiver states he really will not get in the bed and stay there like he should. 09/04/2021 upon evaluation patient appears to be doing decently well today in regard to the wound on his heel as well as the sacral area. I am actually very pleased with both and how things are appearing currently. There does not appear to be any evidence of active infection I think that his caregiver is doing an awesome job with regard to the wound care here. She is present today as well and I did discuss this with her. 09/25/2021 upon evaluation  today patient appears to be doing well with regard to his wound on the sacral region. His right heel is also doing well. Unfortunately he has a new area on his left heel which does appear to be pressure injury. I do not see any evidence of active infection locally or systemically which is great news no fevers, chills, nausea, vomiting, or diarrhea. Readmission: MAZE, SHARAF ([name])  [id number] Page 7 of 9 [name] upon evaluation today patient presents for reevaluation here in the clinic concerning issues with his wounds. Since have last seen him he was in the hospital and then subsequently ended up in a skilled nursing facility. Subsequently period of time in the facility he actually had a breakdown in the right thigh location which has been an issue for him. Fortunately I do not see any evidence of active infection locally or systemically which is great news and I am very pleased in that regard. Nonetheless he does have still the wounds on both heels as well as the wound in the sacral area and the new area in the right upper thigh/gluteal region. 12-13-2021 upon evaluation today patient appears to be doing well currently in regard to his wounds. The left heel is going to require some sharp debridement. Fortunately the right heel seems to be doing quite well. Overall I think his gluteal region as well as sacral region also are doing well. 01-10-2022 upon evaluation today patient appears to be doing better in regard to some areas unfortunately his sacral area is not doing as well. It seems of gotten worse his caregiver states when he was in the hospital recently they really did not offloading. Fortunately I do not see any evidence of active infection locally or systemically at this time. 02-01-2022 upon evaluation today patient appears to be doing well currently in regard to his wounds. He is going require some sharp debridement of clearway some of the necrotic  debris. Fortunately I do not see any evidence of active infection locally or systemically which is great news. No fevers, chills, nausea, vomiting, or diarrhea. 02-22-2022 upon evaluation today patient appears to be doing poorly in regard to his right heel the left heel is completely closed. Unfortunately I think he is got some deep tissue injury to this area I am going to do a culture at this spot. With that being said he is having 2 other significant issues in regard to the left thigh this is showing signs of cellulitis all the way down into the thigh and was seeing definite temperature difference in the left thigh versus the right thigh when feeling. It also is of note that the patient is also having some issues here with trouble in regard to having fevers his wife has been giving him Tylenol and ibuprofen to help keep the fevers under control but he has been as high as 102 this is pretty much been going on since Monday or Tuesday of this week. Nonetheless I am concerned I am not exactly sure where the cellulitis on the left thigh is coming from not sure if this is emanating from his sacral region that is a possibility or if there is something completely different going on either way with the fevers I am more worried that he needs to go get this checked out as soon as possible. 03-08-2022 upon evaluation today patient appears to be doing well with regard to his heel as well as sacral region. Both are showing signs of significant improvement compared to the last time I saw him. Overall I think we are in a much better spot 04-04-2022 upon evaluation today patient appears to be doing about the same in regard to his wounds. I do not see any signs of improvement nor worsening at this point. Fortunately there is no evidence of active infection at this time. He is not staying off of this is much as he needs to in  fact is not even sleeping in the hospital bed with the alternating air mattress. 04-16-2022 upon  evaluation today patient appears to be doing well currently in regard to his wound. He has been tolerating the dressing changes without complication. Fortunately I do not see any evidence of infection. The heel actually looks quite well the sacral area actually is still quite deep. From my questioning and discussion with him today I do not believe that he is really staying off of this as much as he should. He seemed a little uncomfortable during that portion of the conversation today. I think that is contributing quite a bit to his lack of progress with regard to healing. 11/7; patient with decubitus ulcers on his right heel as well as his lower sacrum. We have been using Hydrofera Blue. He has home health predominantly for Foley catheter management. There have been some issues getting the Hydrofera Blue through home health but apparently the patient's wife has some supplies and is managing to get these changed.They come every 3 weeks because of transportation difficulties 05-28-2022 upon evaluation today patient appears to be doing decently well currently in regard to his wounds. He still has a depth to the wound in the sacral region but again this does seem to be a little bit less than previous. He has been using Hydrofera Blue both on this in the heel ulcer. Both are going to require some cleaning today sharp debridement on the heel I think is cleaning with saline gauze and the sterile Q-tip is probably sufficient for the sacral area which does not appear to have too much necrotic tissue. 06-18-2022 upon evaluation today patient's wounds are showing signs of doing decently well. Fortunately I do not see any signs of active infection locally nor systemically at this time which is great news. No fevers, chills, nausea, vomiting, or diarrhea. 07-15-2022 upon evaluation today patient appears to be doing well currently in regard to his wound. He has been tolerating the dressing changes  without complication. Fortunately I think that the heel is doing well except for where it is causing pressure to the heel posteriorly and this apparently is due to some kind of exercise machine that his children got him. Nonetheless I am not sure that that is going to be helpful especially if it is causing discomfort and [name]. And especially if it is making the wound worse which is an even bigger deal. With regard to the patient's sacral region this is draining quite significantly. I do think a wound VAC would be ideal here and I would recommend that we go ahead and try to see about getting this ordered for him as soon as possible. The patient's wife and the patient are in agreement with this plan. 08-05-2022 upon evaluation today patient appears to be doing well currently in regard to both his heel and his sacral region. Unfortunately he is not eligible for a wound VAC as he is previously already undergone all the treatment that is allowable under Medicare guidelines for his sacral wound. This is quite unfortunate as I felt like that could have potentially help this speed this up but nonetheless it is where we stand. For that reason I think the [facility] is probably still the best way to continue at this point and I discussed that with the patient and his wife today. 08-26-2022 upon evaluation today patient appears to be doing well currently in regard to his wound. He on the heel of his looking much better I  did have to perform some debridement here. In regard to the sacral area no debridement was necessary it looks to clean this has been although there was a lot of Hydrofera Blue and there much more than is probably necessary. 09-15-2021 upon evaluation today patient appears to be doing well currently in regard to his heel ulcer which is looking okay although the gluteal region specifically the sacral wound is showing signs of some issues there. Fortunately I do not see any evidence of infection  significantly to the heel although I am not so sure about the sacral region. I think he probably would benefit from initiation of antibiotics. Objective Constitutional Well-nourished and well-hydrated in no acute distress. Vitals Time Taken: 8:09 AM, Height: 66 in, Weight: 279 lbs, BMI: 45, Temperature: 98 F, Pulse: 78 bpm, Respiratory Rate: 18 breaths/min, Blood Pressure: 136/78 mmHg. Respiratory normal breathing without difficulty. DEVANSH, KEGEL ([name]) [id number] Page 8 of 9 Psychiatric this patient is able to make decisions and demonstrates good insight into disease process. Alert and Oriented x 3. pleasant and cooperative. General Notes: Upon inspection patient's wound bed actually showed signs of good granulation" at this point. Fortunately I do not see any signs of active infection systemically though locally not sacral region I do believe there is an issue here that needs to be addressed. Integumentary (Hair, Skin) Wound #5 status is Open. Original cause of wound was Pressure Injury. The date acquired was: 07/01/2021. The wound has been in treatment 42 weeks. The wound is located on the Right Calcaneus. The wound measures 2.5cm length x 1.7cm width x 0.3cm depth; 3.338cm^2 area and 1.001cm^3 volume. There is Fat Layer (Subcutaneous Tissue) exposed. There is no tunneling or undermining noted. There is a medium amount of serosanguineous drainage noted. There is medium (34-66%) red, pink granulation within the wound bed. There is a medium (34-66%) amount of necrotic tissue within the wound bed including Adherent Slough. Wound #7 status is Open. Original cause of wound was Pressure Injury. The date acquired was: 07/01/2021. The wound has been in treatment 42 weeks. The wound is located on the Sacrum. The wound measures 2.5cm length x 2.5cm width x 3.7cm depth; 4.909cm^2 area and 18.162cm^3 volume. There is Fat Layer (Subcutaneous Tissue) exposed.  There is no tunneling or undermining noted. There is a medium amount of serosanguineous drainage noted. There is large (67- 100%) red granulation within the wound bed. There is no necrotic tissue within the wound bed. 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 Home Health: [facility] for wound care. May utilize formulary equivalent dressing for wound treatment orders unless otherwise specified. Home Health Nurse may visit PRN to address patientoos wound care needs. Lajean Manes [phone] Scheduled days for dressing changes to be completed; exception, patient has scheduled wound care visit that day. **Please direct any NON-WOUND related issues/requests for orders to patient's Primary Care Physician. **If current dressing causes regression in wound condition, may D/C ordered dressing product/s and apply Normal Saline Moist Dressing daily until next [facility] or Other MD appointment. **Notify [facility] of regression in wound condition at [phone]. Bathing/ Shower/ Hygiene: No tub bath. Anesthetic (Use 'Patient Medications' Section for Anesthetic Order Entry): Lidocaine applied to wound bed Edema Control - Lymphedema / Segmental Compressive Device / Other: Elevate, Exercise Daily and Avoid Standing for Long Periods of Time. Elevate legs to the level of the heart and pump ankles as often as possible Elevate leg(s) parallel  to the floor when sitting. DO YOUR BEST to sleep in the bed at night. DO NOT sleep in your recliner. Long hours of sitting in a recliner leads to swelling of the legs and/or potential wounds on your backside. Off-Loading: Gel wheelchair cushion Low air-loss mattress (Group 2) - Sleep in bed every night. Turn and reposition every 2 hours - keep pressure off of the sacrum and heels wounds Other: - PRAFO boot in bed keep pressure off of sacrum/gluteus and heels- Additional Orders / Instructions: Follow Nutritious Diet and Increase Protein Intake The following medication(s) was prescribed: Bactrim DS oral 800 mg-160 mg tablet 1 1 tablet oral twice a day x 14 days starting [name] WOUND #5: - Calcaneus Wound Laterality: Right Prim Dressing: Hydrofera Blue Ready Transfer Foam, 2.5x2.5 (in/in) 3 x Per Week/30 Days ary Discharge Instructions: cut to size of wound Secondary Dressing: (BORDER) Zetuvit Plus SILICONE BORDER Dressing 4x4 (in/in) 3 x Per Week/30 Days WOUND #7: - Sacrum Wound Laterality: Topical: calmoseptine 1 x Per Day/30 Days Discharge Instructions: apply to excorated areas Prim Dressing: Hydrofera Blue Ready Transfer Foam, 4x5 (in/in) 1 x Per Day/30 Days ary Discharge Instructions: pack lightly into wound Secondary Dressing: (BORDER) Zetuvit Plus SILICONE BORDER Dressing 4x4 (in/in) 1 x Per Day/30 Days 1. I suggest we have the patient continue to monitor for any signs of infection or worsening. If anything changes he can contact the office and let me know. Otherwise I will go ahead and see about sending in a prescription for Bactrim DS I think this would be a good option considering he has a lot of urine leakage Plemons, [name] ([id number]) [id number] Page 9 of 9 and I think that E. coli is a very strong possibility for what may be going on here. 2. I am also can recommend that we have the patient continue to monitor for any signs of worsening  infection. If anything changes he knows contact the office let me know. We will see patient back for reevaluation in 1 week here in the clinic. If anything worsens or changes patient will contact our office for additional recommendations. Electronic Signature(s) Signed: 09/16/2022 8:47:02 AM By: [name] Keeler PA-C Entered By: [name] on 09/16/2022 08:47:02 -------------------------------------------------------------------------------- SuperBill Details Patient Name: Date of Service: [name] [facility] [name] Medical Record Number: [id number] Patient Account Number: [name] Date of Birth/Sex: Treating RN: [name] [birth year], [name] (66 y.o. [name]) [name] Primary Care Provider: Lavone [name] Other Clinician: Referring Provider: Treating Provider/Extender: [name] in Treatment: 42 Diagnosis Coding ICD-10 Codes Code Description [phone] Pressure ulcer of right heel, stage 3 L89.623 Pressure ulcer of left heel, stage 3 L89.154 Pressure ulcer of sacral region, stage 4 L24.A0 Irritant contact dermatitis due to friction or contact with body fluids, unspecified L98.412 Non-pressure chronic ulcer of buttock with fat layer exposed M62.81 Muscle weakness (generalized) F31.9 Bipolar disorder, unspecified E11.622 Type 2 diabetes mellitus with other skin ulcer I10 Essential (primary) hypertension Z79.01 Long term (current) use of anticoagulants 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 Facility Procedures : CPT4 Code: [id number] Description: [id number] - WOUND  CARE VISIT-LEV 3 EST PT Modifier: Quantity: 1 Physician Procedures : CPT4 Code Description Modifier [id number] - WC PHYS LEVEL 4 - EST PT ICD-10 Diagnosis Description L89.613 Pressure ulcer of right heel, stage 3 L89.623 Pressure ulcer of left heel, stage 3 L89.154 Pressure ulcer of sacral region, stage 4 L24.A0  Irritant contact dermatitis due to friction or contact with body fluids,  unspecified Quantity: 1 Electronic Signature(s) Signed: 09/16/2022 8:47:14 AM By: [name] Keeler PA-C Entered By: [name] on 09/16/2022 08:47:13

## 2022-09-17 NOTE — Progress Notes (Signed)
HA, KRENGEL ([id number]) [id number] Page 1 of 10 Visit Report for 09/16/2022 Arrival Information Details Patient Name: Date of Service: [name] 09/16/2022 8:00 [place] Record Number: [id number] Patient Account Number: [id number] Date of Birth/Sex: Treating RN: 1955/10/16 (66 y.o. [name]) [name] Primary Care Tierney Behl: [name] Other Clinician: Referring Jamaree Hosier: Treating Arina Torry/Extender: [name]: 69 Visit Information History Since Last Visit Added or deleted any medications: No Patient Arrived: Wheel Chair Any new allergies or adverse reactions: No Arrival Time: 08:01 Had a fall or experienced change in No Accompanied By: wife activities of daily living that may affect Transfer Assistance: None risk of falls: Patient Identification Verified: Yes Signs or symptoms of abuse/neglect since last visito No Secondary Verification Process Completed: Yes Hospitalized since last visit: No Patient Requires Transmission-Based Precautions: No Implantable device outside of the clinic excluding No Patient Has Alerts: Yes cellular tissue based products placed in the center Patient Alerts: Patient on Blood Thinner since last visit: Has Dressing in Place as Prescribed: Yes Pain Present Now: No Electronic Signature(s) Signed: 09/16/2022 5:01:39 PM By: [name] RN Entered By: [name] on 09/16/2022 08:09:08 -------------------------------------------------------------------------------- Clinic Level of Care Assessment Details Patient Name: Date of Service: [name] [facility] 09/16/2022 8:00 [place] Record Number: [id number] Patient Account Number: [id number] Date of Birth/Sex: Treating RN: [birth year] (66 y.o. [name]) [name] Primary Care Lundyn Coste: [name] Other Clinician: Referring [name]: Treating [name]/Extender: [name] in  Treatment: 42 Clinic Level of Care Assessment Items TOOL 4 Quantity Score X- 1 0 Use when only an EandM is performed on FOLLOW-UP visit ASSESSMENTS - Nursing Assessment / Reassessment X- 1 10 Reassessment of Co-morbidities (includes updates in patient status) X- 1 5 Reassessment of Adherence to Treatment Plan KAIREE, AMICI ([id number]) [id number] Page 2 of 10 ASSESSMENTS - Wound and Skin A ssessment / Reassessment []  - Simple Wound Assessment / Reassessment - one wound 0 X- 2 5 Complex Wound Assessment / Reassessment - multiple wounds []  - 0 Dermatologic / Skin Assessment (not related to wound area) ASSESSMENTS - Focused Assessment []  - 0 Circumferential Edema Measurements - multi extremities []  - 0 Nutritional Assessment / Counseling / Intervention []  - 0 Lower Extremity Assessment (monofilament, tuning fork, pulses) []  - 0 Peripheral Arterial Disease Assessment (using hand held doppler) ASSESSMENTS - Ostomy and/or Continence Assessment and Care []  - 0 Incontinence Assessment and Management []  - 0 Ostomy Care Assessment and Management (repouching, etc.) PROCESS - Coordination of Care X - Simple Patient / Family Education for ongoing care 1 15 []  - 0 Complex (extensive) Patient / Family Education for ongoing care []  - 0 Staff obtains Programmer, systems, Records, T Results / Process Orders est []  - 0 Staff telephones HHA, Nursing Homes / Clarify orders / etc []  - 0 Routine Transfer to another Facility (non-emergent condition) []  - 0 Routine Hospital Admission (non-emergent condition) []  - 0 New Admissions / Biomedical engineer / Ordering NPWT Apligraf, etc. , []  - 0 Emergency Hospital Admission (emergent condition) X- 1 10 Simple Discharge Coordination []  - 0 Complex (extensive) Discharge Coordination PROCESS - Special Needs []  - 0 Pediatric / Minor Patient Management []  - 0 Isolation Patient Management []  - 0 Hearing / Language  / Visual special needs []  - 0 Assessment of Community assistance (transportation, D/C planning, etc.) []  - 0 Additional assistance / Altered mentation []  - 0 Support Surface(s) Assessment (bed, cushion, seat, etc.) INTERVENTIONS - Wound Cleansing / Measurement []  -  0 Simple Wound Cleansing - one wound X- 2 5 Complex Wound Cleansing - multiple wounds X- 1 5 Wound Imaging (photographs - any number of wounds) []  - 0 Wound Tracing (instead of photographs) []  - 0 Simple Wound Measurement - one wound X- 2 5 Complex Wound Measurement - multiple wounds INTERVENTIONS - Wound Dressings X - Small Wound Dressing one or multiple wounds 2 10 []  - 0 Medium Wound Dressing one or multiple wounds []  - 0 Large Wound Dressing one or multiple wounds []  - 0 Application of Medications - topical []  - 0 Application of Medications - injection INTERVENTIONS - Miscellaneous []  - 0 External ear exam Sava, [name] ([id number]) [id number] Page 3 of 10 []  - 0 Specimen Collection (cultures, biopsies, blood, body fluids, etc.) []  - 0 Specimen(s) / Culture(s) sent or taken to Lab for analysis []  - 0 Patient Transfer (multiple staff / Harrel Lemon Lift / Similar devices) []  - 0 Simple Staple / Suture removal (25 or less) []  - 0 Complex Staple / Suture removal (26 or more) []  - 0 Hypo / Hyperglycemic Management (close monitor of Blood Glucose) []  - 0 Ankle / Brachial Index (ABI) - do not check if billed separately X- 1 5 Vital Signs Has the patient been seen at the hospital within the last three years: Yes Total Score: 100 Level Of Care: New/Established - Level 3 Electronic Signature(s) Signed: 09/16/2022 5:01:39 PM By: [name] RN Entered By: [name] on 09/16/2022 08:42:40 -------------------------------------------------------------------------------- Encounter Discharge Information Details Patient Name: Date of Service: [name] 09/16/2022  8:00 [place] Record Number: [id number] Patient Account Number: [id number] Date of Birth/Sex: Treating RN: 09-08-1955 (66 y.o. [name]) [name] Primary Care Salaam Battershell: [name] Other Clinician: Referring Matan Steen: Treating Dearra Myhand/Extender: [name]: 42 Encounter Discharge Information Items Discharge Condition: Stable Ambulatory Status: Wheelchair Discharge Destination: Home Transportation: Private Auto Accompanied By: wife Schedule Follow-up Appointment: Yes Clinical Summary of Care: Electronic Signature(s) Signed: 09/16/2022 5:01:39 PM By: [name] RN Entered By: [name] on 09/16/2022 08:43:25 -------------------------------------------------------------------------------- Lower Extremity Assessment Details Patient Name: Date of Service: [name] [facility] 09/16/2022 8:00 A ALVONTE PIKER, [name] ([id number]) [id number] Page 4 of 10 Medical Record Number: [id number] Patient Account Number: [id number] Date of Birth/Sex: Treating RN: [birth year] (66 y.o. [name]) [name] Primary Care Amit Meloy: [name] Other Clinician: Referring Shaniyah Wix: Treating Inayah Woodin/Extender: [name]: 68 Electronic Signature(s) Signed: 09/16/2022 5:01:39 PM By: [name] RN Entered By: [name] on 09/16/2022 08:17:17 -------------------------------------------------------------------------------- Multi Wound Chart Details Patient Name: Date of Service: [name] [facility] 09/16/2022 8:00 A M Medical Record Number: [id number] Patient Account Number: [id number] Date of Birth/Sex: Treating RN: 12-15-1955 (66 y.o. [name]) [name] Primary Care Leonna Schlee: [name] Other Clinician: Referring Crosley Stejskal: Treating Afifa Truax/Extender: [name]: 42 Vital Signs Height(in): 67 Pulse(bpm): 46 Weight(lbs): 32 Blood  Pressure(mmHg): 136/78 Body Mass Index(BMI): 45 Temperature(F): 98 Respiratory Rate(breaths/min): 18 [5:Photos: No Photos] [N/A:N/A] Right Calcaneus Sacrum N/A Wound Location: Pressure Injury Pressure Injury N/A Wounding Event: Pressure Ulcer Pressure Ulcer N/A Primary Etiology: Anemia, Sleep Apnea, Coronary Artery Anemia, Sleep Apnea, Coronary Artery N/A Comorbid History: Disease, Deep Vein Thrombosis, Disease, Deep Vein Thrombosis, Hypertension, Type II Diabetes, Hypertension, Type II Diabetes, History of pressure wounds, History of pressure wounds, Neuropathy Neuropathy 07/01/2021 07/01/2021 N/A Date Acquired: 42 42 N/A Weeks of Treatment: Open Open N/A Wound Status: No No N/A Wound Recurrence: 2.5x1.7x0.3 2.5x2.5x3.7 N/A Measurements  L x W x D (cm) 3.338 4.909 N/A A (cm) : rea 1.001 18.162 N/A Volume (cm) : 19.40% -56.20% N/A % Reduction in A rea: -141.80% -92.70% N/A % Reduction in Volume: Category/Stage III Category/Stage IV N/A Classification: Medium Medium N/A Exudate A mount: Serosanguineous Serosanguineous N/A Exudate Type: red, brown red, brown N/A Exudate Color: Medium (34-66%) Large (67-100%) N/A Granulation A mount: Red, Pink Red N/A Granulation Quality: Medium (34-66%) None Present (0%) N/A Necrotic A mount: Fat Layer (Subcutaneous Tissue): Yes Fat Layer (Subcutaneous Tissue): Yes N/A Exposed Structures: Fascia: No Fascia: No Aspinwall, [name] ([id number]) [id number] Page 5 of 10 Tendon: No Tendon: No Muscle: No Muscle: No Joint: No Joint: No Bone: No Bone: No None None N/A Epithelialization: Treatment Notes Electronic Signature(s) Signed: 09/16/2022 5:01:39 PM By: [name] RN Entered By: [name] on 09/16/2022 08:17:23 -------------------------------------------------------------------------------- Multi-Disciplinary Care Plan Details Patient Name: Date of Service: [name] [facility]  09/16/2022 8:00 A M Medical Record Number: [id number] Patient Account Number: [id number] Date of Birth/Sex: Treating RN: [birth year] (66 y.o. [name]) [name] Primary Care Makelle Marrone: [name] Other Clinician: Referring Shenetta Schnackenberg: Treating Emrie Gayle/Extender: [name]: 42 Active Inactive Wound/Skin Impairment Nursing Diagnoses: Knowledge deficit related to ulceration/compromised skin integrity Goals: Patient/caregiver will verbalize understanding of skin care regimen Date Initiated: 11/22/2021 Target Resolution Date: 09/23/2022 Goal Status: Active Ulcer/skin breakdown will have a volume reduction of 30% by week 4 Date Initiated: 11/22/2021 Date Inactivated: 01/10/2022 Target Resolution Date: 12/23/2021 Goal Status: Unmet Unmet Reason: comorbities Ulcer/skin breakdown will have a volume reduction of 50% by week 8 Date Initiated: 11/22/2021 Date Inactivated: 05/07/2022 Target Resolution Date: 01/22/2022 Goal Status: Unmet Unmet Reason: comorbidities Ulcer/skin breakdown will have a volume reduction of 80% by week 12 Date Initiated: 11/22/2021 Date Inactivated: 05/07/2022 Target Resolution Date: 02/22/2022 Goal Status: Unmet Unmet Reason: comorbidities Ulcer/skin breakdown will heal within 14 weeks Date Initiated: 11/22/2021 Date Inactivated: 05/07/2022 Target Resolution Date: 03/25/2022 Goal Status: Unmet Unmet Reason: comorbidities Interventions: Assess patient/caregiver ability to obtain necessary supplies Assess patient/caregiver ability to perform ulcer/skin care regimen upon admission and as needed Assess ulceration(s) every visit Notes: Electronic Signature(s) Signed: 09/16/2022 5:01:39 PM By: [name] RN Entered By: [name] on 09/16/2022 08:18:07 Azer, [name] ([id number]) [id number] Page 6 of 10 -------------------------------------------------------------------------------- Pain Assessment  Details Patient Name: Date of Service: [name] [facility] 09/16/2022 8:00 [place] Record Number: [id number] Patient Account Number: [id number] Date of Birth/Sex: Treating RN: [birth year] (66 y.o. [name]) [name] Primary Care Teesha Ohm: [name] Other Clinician: Referring Slaton Reaser: Treating Teodoro Jeffreys/Extender: [name]: 42 Active Problems Location of Pain Severity and Description of Pain Patient Has Paino No Site Locations Pain Management and Medication Current Pain Management: Electronic Signature(s) Signed: 09/16/2022 5:01:39 PM By: [name] RN Entered By: [name] on 09/16/2022 08:09:59 -------------------------------------------------------------------------------- Patient/Caregiver Education Details Patient Name: Date of Service: [name] 3/18/2024andnbsp8:00 [place] Record Number: [id number] Patient Account Number: [id number] Date of Birth/Gender: Treating RN: [birth year] (66 y.o. Oval Linsey Primary Care Physician: [name] Other Clinician: Referring Physician: Treating Physician/Extender: [name]: [address], [place] ([id number]) [id number].pdf Page 7 of 10 Education Assessment Education Provided To: Patient Education Topics Provided Pressure: Methods: Explain/Verbal Responses: State content correctly Electronic Signature(s) Signed: 09/16/2022 5:01:39 PM By: [name] RN Entered By: [name] on 09/16/2022 08:18:20 -------------------------------------------------------------------------------- Wound Assessment Details Patient Name: Date of Service: [name] [facility] 09/16/2022 8:00 A M  Medical Record Number: [id number] Patient Account Number: [id number] Date of Birth/Sex: Treating RN: 1956/05/08 (66 y.o. [name]) [name] Primary Care Josiane Labine: [name] Other Clinician: Referring  Mahli Glahn: Treating Haiven Nardone/Extender: [name]: 42 Wound Status Wound Number: 5 Primary Pressure Ulcer Etiology: Wound Location: Right Calcaneus Wound Open Wounding Event: Pressure Injury Status: Date Acquired: 07/01/2021 Comorbid Anemia, Sleep Apnea, Coronary Artery Disease, Deep Vein Weeks Of Treatment: 42 History: Thrombosis, Hypertension, Type II Diabetes, History of pressure Clustered Wound: No wounds, Neuropathy Photos Wound Measurements Length: (cm) 2.5 Width: (cm) 1.7 Depth: (cm) 0.3 Area: (cm) 3.338 Volume: (cm) 1.001 % Reduction in Area: 19.4% % Reduction in Volume: -141.8% Epithelialization: None Tunneling: No Undermining: No Wound Description Classification: Category/Stage III Exudate Amount: Medium Exudate Type: Serosanguineous Exudate Color: red, brown Cicero, Abir ([id number]) Foul Odor After Cleansing: No Slough/Fibrino Yes [id number] Page 8 of 10 Wound Bed Granulation Amount: Medium (34-66%) Exposed Structure Granulation Quality: Red, Pink Fascia Exposed: No Necrotic Amount: Medium (34-66%) Fat Layer (Subcutaneous Tissue) Exposed: Yes Necrotic Quality: Adherent Slough Tendon Exposed: No Muscle Exposed: No Joint Exposed: No Bone Exposed: No Treatment Notes Wound #5 (Calcaneus) Wound Laterality: Right Cleanser Peri-Wound Care Topical Primary Dressing Hydrofera Blue Ready Transfer Foam, 2.5x2.5 (in/in) Discharge Instruction: cut to size of wound Secondary Dressing (BORDER) Zetuvit Plus SILICONE BORDER Dressing 4x4 (in/in) Secured With Compression Wrap Compression Stockings Add-Ons Electronic Signature(s) Signed: 09/16/2022 5:01:39 PM By: [name] RN Entered By: [name] on 09/16/2022 08:17:45 -------------------------------------------------------------------------------- Wound Assessment Details Patient Name: Date of Service: [name] [facility]  09/16/2022 8:00 A M Medical Record Number: [id number] Patient Account Number: [id number] Date of Birth/Sex: Treating RN: 1955/12/17 (66 y.o. [name]) [name] Primary Care Casper Pagliuca: [name] Other Clinician: Referring Daveah Varone: Treating Laval Cafaro/Extender: [name]: 42 Wound Status Wound Number: 7 Primary Pressure Ulcer Etiology: Wound Location: Sacrum Wound Open Wounding Event: Pressure Injury Status: Date Acquired: 07/01/2021 Comorbid Anemia, Sleep Apnea, Coronary Artery Disease, Deep Vein Weeks Of Treatment: 42 History: Thrombosis, Hypertension, Type II Diabetes, History of pressure Clustered Wound: No wounds, Neuropathy Photos JAYTEN, KISSAM ([id number]) [id number] Page 9 of 10 Wound Measurements Length: (cm) 2.5 Width: (cm) 2.5 Depth: (cm) 3.7 Area: (cm) 4.909 Volume: (cm) 18.162 % Reduction in Area: -56.2% % Reduction in Volume: -92.7% Epithelialization: None Tunneling: No Undermining: No Wound Description Classification: Category/Stage IV Exudate Amount: Medium Exudate Type: Serosanguineous Exudate Color: red, brown Foul Odor After Cleansing: No Slough/Fibrino Yes Wound Bed Granulation Amount: Large (67-100%) Exposed Structure Granulation Quality: Red Fascia Exposed: No Necrotic Amount: None Present (0%) Fat Layer (Subcutaneous Tissue) Exposed: Yes Tendon Exposed: No Muscle Exposed: No Joint Exposed: No Bone Exposed: No Treatment Notes Wound #7 (Sacrum) Cleanser Peri-Wound Care Topical calmoseptine Discharge Instruction: apply to excorated areas Primary Dressing Hydrofera Blue Ready Transfer Foam, 4x5 (in/in) Discharge Instruction: pack lightly into wound Secondary Dressing (BORDER) Zetuvit Plus SILICONE BORDER Dressing 4x4 (in/in) Secured With Compression Wrap Compression Stockings Add-Ons Electronic Signature(s) Signed: 09/16/2022 5:01:39 PM By: [name]  RN Entered By: [name] on 09/16/2022 08:17:10 [place]ak, [name] ([id number]) [id number] Page 10 of 10 -------------------------------------------------------------------------------- Vitals Details Patient Name: Date of Service: [name] [facility] 09/16/2022 8:00 [place] Record Number: [id number] Patient Account Number: [id number] Date of Birth/Sex: Treating RN: 1955-08-19 (66 y.o. [name]) [name] Primary Care Upton Russey: [name] Other Clinician: Referring Notnamed Croucher: Treating Archimedes Harold/Extender: [name]: 42 Vital Signs Time Taken: 08:09 Temperature (F): 98 Height (in):  66 Pulse (bpm): 78 Weight (lbs): 279 Respiratory Rate (breaths/min): 18 Body Mass Index (BMI): 45 Blood Pressure (mmHg): 136/78 Reference Range: 80 - 120 mg / dl Electronic Signature(s) Signed: 09/16/2022 5:01:39 PM By: [name] RN Entered By: [name] on 09/16/2022 08:09:34

## 2022-09-18 ENCOUNTER — Inpatient Hospital Stay
Admission: EM | Admit: 2022-09-18 | Discharge: 2022-09-25 | DRG: 698 | Disposition: A | Discharge status: Home Health | Payer: Medicare Other | Attending: Internal Medicine | Admitting: Internal Medicine

## 2022-09-18 ENCOUNTER — Other Ambulatory Visit: Payer: Self-pay

## 2022-09-18 DIAGNOSIS — N1330 Unspecified hydronephrosis: Secondary | ICD-10-CM | POA: Insufficient documentation

## 2022-09-18 DIAGNOSIS — Z7984 Long term (current) use of oral hypoglycemic drugs: Secondary | ICD-10-CM

## 2022-09-18 DIAGNOSIS — E8722 Chronic metabolic acidosis: Secondary | ICD-10-CM | POA: Diagnosis present

## 2022-09-18 DIAGNOSIS — N1832 Chronic kidney disease, stage 3b: Secondary | ICD-10-CM | POA: Diagnosis present

## 2022-09-18 DIAGNOSIS — I10 Essential (primary) hypertension: Secondary | ICD-10-CM | POA: Diagnosis present

## 2022-09-18 DIAGNOSIS — E875 Hyperkalemia: Secondary | ICD-10-CM

## 2022-09-18 DIAGNOSIS — Z8616 Personal history of COVID-19: Secondary | ICD-10-CM

## 2022-09-18 DIAGNOSIS — G3109 Other frontotemporal dementia: Secondary | ICD-10-CM | POA: Diagnosis present

## 2022-09-18 DIAGNOSIS — I130 Hypertensive heart and chronic kidney disease with heart failure and stage 1 through stage 4 chronic kidney disease, or unspecified chronic kidney disease: Secondary | ICD-10-CM | POA: Diagnosis present

## 2022-09-18 DIAGNOSIS — E119 Type 2 diabetes mellitus without complications: Secondary | ICD-10-CM

## 2022-09-18 DIAGNOSIS — T83021A Displacement of indwelling urethral catheter, initial encounter: Principal | ICD-10-CM | POA: Diagnosis present

## 2022-09-18 DIAGNOSIS — Z86718 Personal history of other venous thrombosis and embolism: Secondary | ICD-10-CM

## 2022-09-18 DIAGNOSIS — Z515 Encounter for palliative care: Secondary | ICD-10-CM

## 2022-09-18 DIAGNOSIS — N3289 Other specified disorders of bladder: Secondary | ICD-10-CM

## 2022-09-18 DIAGNOSIS — L89892 Pressure ulcer of other site, stage 2: Secondary | ICD-10-CM | POA: Diagnosis present

## 2022-09-18 DIAGNOSIS — Z87820 Personal history of traumatic brain injury: Secondary | ICD-10-CM

## 2022-09-18 DIAGNOSIS — I8290 Acute embolism and thrombosis of unspecified vein: Secondary | ICD-10-CM | POA: Diagnosis present

## 2022-09-18 DIAGNOSIS — R310 Gross hematuria: Secondary | ICD-10-CM | POA: Insufficient documentation

## 2022-09-18 DIAGNOSIS — F0283 Dementia in other diseases classified elsewhere, unspecified severity, with mood disturbance: Secondary | ICD-10-CM | POA: Diagnosis present

## 2022-09-18 DIAGNOSIS — L89159 Pressure ulcer of sacral region, unspecified stage: Secondary | ICD-10-CM | POA: Diagnosis present

## 2022-09-18 DIAGNOSIS — E785 Hyperlipidemia, unspecified: Secondary | ICD-10-CM | POA: Diagnosis present

## 2022-09-18 DIAGNOSIS — N183 Chronic kidney disease, stage 3 unspecified: Secondary | ICD-10-CM | POA: Insufficient documentation

## 2022-09-18 DIAGNOSIS — L89150 Pressure ulcer of sacral region, unstageable: Secondary | ICD-10-CM | POA: Diagnosis present

## 2022-09-18 DIAGNOSIS — F3181 Bipolar II disorder: Secondary | ICD-10-CM | POA: Diagnosis present

## 2022-09-18 DIAGNOSIS — Z6841 Body Mass Index (BMI) 40.0 and over, adult: Secondary | ICD-10-CM

## 2022-09-18 DIAGNOSIS — F0390 Unspecified dementia without behavioral disturbance: Secondary | ICD-10-CM | POA: Diagnosis present

## 2022-09-18 DIAGNOSIS — I5033 Acute on chronic diastolic (congestive) heart failure: Secondary | ICD-10-CM | POA: Diagnosis present

## 2022-09-18 DIAGNOSIS — E66813 Obesity, class 3: Secondary | ICD-10-CM | POA: Diagnosis present

## 2022-09-18 DIAGNOSIS — Z79899 Other long term (current) drug therapy: Secondary | ICD-10-CM

## 2022-09-18 DIAGNOSIS — Y738 Miscellaneous gastroenterology and urology devices associated with adverse incidents, not elsewhere classified: Secondary | ICD-10-CM | POA: Diagnosis present

## 2022-09-18 DIAGNOSIS — N319 Neuromuscular dysfunction of bladder, unspecified: Secondary | ICD-10-CM | POA: Diagnosis present

## 2022-09-18 DIAGNOSIS — N179 Acute kidney failure, unspecified: Secondary | ICD-10-CM | POA: Diagnosis present

## 2022-09-18 DIAGNOSIS — R319 Hematuria, unspecified: Secondary | ICD-10-CM | POA: Diagnosis present

## 2022-09-18 DIAGNOSIS — G4700 Insomnia, unspecified: Secondary | ICD-10-CM | POA: Diagnosis present

## 2022-09-18 DIAGNOSIS — D631 Anemia in chronic kidney disease: Secondary | ICD-10-CM | POA: Diagnosis present

## 2022-09-18 DIAGNOSIS — E861 Hypovolemia: Secondary | ICD-10-CM | POA: Diagnosis present

## 2022-09-18 DIAGNOSIS — E871 Hypo-osmolality and hyponatremia: Secondary | ICD-10-CM | POA: Diagnosis present

## 2022-09-18 DIAGNOSIS — N189 Chronic kidney disease, unspecified: Secondary | ICD-10-CM | POA: Diagnosis present

## 2022-09-18 DIAGNOSIS — Z7901 Long term (current) use of anticoagulants: Secondary | ICD-10-CM

## 2022-09-18 DIAGNOSIS — E1122 Type 2 diabetes mellitus with diabetic chronic kidney disease: Secondary | ICD-10-CM | POA: Diagnosis present

## 2022-09-18 DIAGNOSIS — E872 Acidosis, unspecified: Secondary | ICD-10-CM | POA: Insufficient documentation

## 2022-09-18 DIAGNOSIS — F411 Generalized anxiety disorder: Secondary | ICD-10-CM | POA: Diagnosis present

## 2022-09-18 LAB — CBC WITH DIFFERENTIAL/PLATELET
Abs Immature Granulocytes: 0.04 10*3/uL (ref 0.00–0.07)
Basophils Absolute: 0 10*3/uL (ref 0.0–0.1)
Basophils Relative: 0 %
Eosinophils Absolute: 0.5 10*3/uL (ref 0.0–0.5)
Eosinophils Relative: 5 %
HCT: 28.5 % — ABNORMAL LOW (ref 39.0–52.0)
Hemoglobin: 8.8 g/dL — ABNORMAL LOW (ref 13.0–17.0)
Immature Granulocytes: 0 %
Lymphocytes Relative: 15 %
Lymphs Abs: 1.4 10*3/uL (ref 0.7–4.0)
MCH: 25.8 pg — ABNORMAL LOW (ref 26.0–34.0)
MCHC: 30.9 g/dL (ref 30.0–36.0)
MCV: 83.6 fL (ref 80.0–100.0)
Monocytes Absolute: 0.6 10*3/uL (ref 0.1–1.0)
Monocytes Relative: 6 %
Neutro Abs: 6.9 10*3/uL (ref 1.7–7.7)
Neutrophils Relative %: 74 %
Platelets: 214 10*3/uL (ref 150–400)
RBC: 3.41 MIL/uL — ABNORMAL LOW (ref 4.22–5.81)
RDW: 18.2 % — ABNORMAL HIGH (ref 11.5–15.5)
WBC: 9.5 10*3/uL (ref 4.0–10.5)
nRBC: 0 % (ref 0.0–0.2)

## 2022-09-18 NOTE — ED Provider Notes (Signed)
[facility] Provider Note    Event Date/Time   First MD Initiated Contact with Patient 09/18/22 2302     (approximate)   History   Hematuria   HPI  [name] is a 67 y.o. male brought to the ED via EMS from home with a chief complaint of hematuria.  Patient has a history of neurogenic bladder with chronic indwelling Foley catheters.  He is also on Eliquis for history of blood clots.  Home health nurse replaced Foley this afternoon, patient noted bright red blood in the bag later and his family member who is a Theatre stage manager subsequently remove the Foley catheter.  Endorses mild suprapubic abdominal pain with distention.  Denies fever/chills, chest pain, shortness of breath, flank pain, nausea, vomiting or dizziness.     Past Medical History   Past Medical History:  Diagnosis Date   Bipolar disorder (HCC)    Diabetes mellitus without complication (HCC)    History of blood clots    Hypertension    TBI (traumatic brain injury) [facility])      Active Problem List   Patient Active Problem List   Diagnosis Date Noted   Hematuria 09/19/2022   Sepsis due to gram-negative UTI (HCC) 03/20/2022   Type 2 diabetes mellitus with chronic kidney disease, without long-term current use of insulin (HCC) 03/20/2022   Dyslipidemia 03/20/2022   Complicated UTI (urinary tract infection) 02/22/2022   Acute renal failure superimposed on stage 3b chronic kidney disease (HCC) 02/22/2022   Chronic diastolic CHF (congestive heart failure) (HCC) 02/22/2022   Obesity with body mass index of 30.0-39.9 02/22/2022   Cellulitis of left thigh 02/22/2022   Essential hypertension 01/13/2022   Seizure prophylaxis 01/13/2022   Acute pyelonephritis 01/05/2022   Fever 12/16/2021   Hypotension 10/09/2021   Abdominal distension 10/08/2021   AKI (acute kidney injury) (HCC) 10/08/2021   Sacral wound 10/07/2021   MSSA bacteremia 10/07/2021   Severe sepsis (HCC) 10/06/2021    Sepsis secondary to UTI (HCC) 07/28/2021   COVID-19 virus infection 07/28/2021   Chronic indwelling Foley catheter 06/22/2021   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   TBI (traumatic brain injury) (HCC)    Healed ulcer of foot on examination 04/14/2021   Acute UTI 02/23/2021   Osteomyelitis of vertebra, sacral and sacrococcygeal region (HCC) 10/18/2020   Hyponatremia 07/07/2020   Encephalopathy 07/07/2020   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) 07/07/2020   Anemia of chronic disease 07/07/2020   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) 06/01/2020   Sacral osteomyelitis (HCC) 04/13/2020   Urinary retention 03/19/2020   History of DVT (deep vein thrombosis) 03/19/2020   Pressure injury of sacral region, stage 4 (HCC) 03/19/2020   Severe sepsis with septic shock (HCC) 03/18/2020   Obesity, Class III, BMI 40-49.9 (morbid obesity) (HCC) 03/18/2020   Cellulitis 03/18/2020   Acute cystitis without hematuria    Stage 3b chronic kidney disease (CKD) (HCC) - baseline SCr 1.8-1.9 01/20/2020   Decubitus ulcers 01/17/2020   Sepsis (HCC) 01/16/2020   Cellulitis of sacral region 01/16/2020   Acute kidney injury superimposed on chronic kidney disease (HCC) 01/16/2020   Acute metabolic encephalopathy 01/16/2020   Bipolar disorder, in full remission, most recent episode mixed (HCC) 12/29/2019   High risk medication use 10/25/2019   Noncompliance with treatment plan 10/25/2019   Bipolar I disorder, most recent episode mixed (HCC) 01/07/2019   GAD (generalized anxiety disorder) 01/07/2019   Insomnia due to medical condition  01/07/2019   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) 03/06/2016   Bipolar disorder (HCC) 01/03/2016   Lithium toxicity 01/02/2016   Chronic anticoagulation - on Eliquis 07/06/2014   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/06/2014   Hyperlipidemia 07/06/2014   Leg  swelling 07/06/2014   Obesity, Class II, BMI 35-39.9, with comorbidity 07/06/2014   On medication for venous thromboembolism 07/06/2014   Sleep apnea 07/06/2014   Type II diabetes mellitus with renal manifestations (HCC) 07/06/2014   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 Authorizing Provider  amLODipine-olmesartan (AZOR) 5-20 MG tablet Take 1 tablet by mouth daily. 01/08/22   [provider]  BACTRIM DS 800-160 MG tablet Take 1 tablet by mouth 2 (two) times daily. 05/28/22   [provider]  cholecalciferol (VITAMIN D) 25 MCG tablet Take 1 tablet (1,000 Units total) by mouth daily. 07/31/21   Enedina Finner, MD  Continuous Blood Gluc Receiver (DEXCOM G7 RECEIVER) DEVI UAD for blood sugar monitoring. 01/23/22   [provider]  ELIQUIS 5 MG TABS tablet Take 5 mg by mouth 2 (two) times daily.  11/17/18   [provider]  fish oil-omega-3 fatty acids 1000 MG capsule SMARTSIG:1 Capsule(s) By Mouth Daily 10/12/21   [provider]  glipiZIDE (GLUCOTROL XL) 2.5 MG 24 hr tablet Take 1 tablet (2.5 mg total) by mouth daily with breakfast. 06/25/21   Alford Highland, MD  memantine (NAMENDA) 10 MG tablet Take 10 mg by mouth 2 (two) times daily.    [provider]  Oxcarbazepine (TRILEPTAL) 300 MG tablet Take 1 tablet (300 mg total) by mouth 2 (two) times daily. 06/04/22   [name], MD  QUEtiapine (SEROQUEL) 50 MG tablet TAKE 1/2 TO 1 (ONE-HALF TO ONE) TABLET BY MOUTH ONCE DAILY  PRN  AGITATION 06/26/22   [name], MD  rosuvastatin (CRESTOR) 20 MG tablet Take 20 mg by mouth daily.  08/29/17   [provider]  vitamin B-12 (CYANOCOBALAMIN) 1000 MCG tablet Take 3,000 mcg by mouth daily.    [provider]     Allergies  Patient has no known allergies.   Family History   Family History  Problem Relation Age of Onset   Depression Father    Deep vein thrombosis Father      Physical Exam  Triage Vital Signs: ED Triage Vitals  Enc Vitals Group     BP      Pulse      Resp      Temp      Temp src      SpO2      Weight      Height      Head Circumference      Peak Flow      Pain Score      Pain Loc      Pain Edu?  Excl. in GC?     Updated Vital Signs: BP 127/65   Pulse 68   Temp 98.1 F (36.7 C) (Oral)   Resp 19   Wt 128.7 kg   SpO2 99%   BMI 45.80 kg/m    General: Awake, no distress.  CV:  RRR.  Good peripheral perfusion.  Resp:  Normal effort.  CTAB. Abd:  Mild suprapubic tenderness to palpation without rebound or guarding.  Mild distention.  Other:  No truncal vesicles.  Large sacral decubitus ulcer with overlying dressing.   ED Results / Procedures / Treatments  Labs (all labs ordered are listed, but only abnormal results are displayed) Labs Reviewed  CBC WITH DIFFERENTIAL/PLATELET - Abnormal; Notable for the following components:      Result Value   RBC 3.41 (*)    Hemoglobin 8.8 (*)    HCT 28.5 (*)    MCH 25.8 (*)    RDW 18.2 (*)    All other components within normal limits  BASIC METABOLIC PANEL - Abnormal; Notable for the following components:   Sodium 125 (*)    Potassium 7.1 (*)    CO2 16 (*)    Glucose, Bld 155 (*)    BUN 45 (*)    Creatinine, Ser 2.94 (*)    Calcium 8.0 (*)    GFR, Estimated 23 (*)    All other components within normal limits  BASIC METABOLIC PANEL - Abnormal; Notable for the following components:   Sodium 129 (*)    Potassium 5.7 (*)    CO2 18 (*)    Glucose, Bld  145 (*)    BUN 44 (*)    Creatinine, Ser 2.69 (*)    Calcium 7.9 (*)    GFR, Estimated 25 (*)    All other components within normal limits  URINALYSIS, W/ REFLEX TO CULTURE (INFECTION SUSPECTED) - Abnormal; Notable for the following components:   Color, Urine YELLOW (*)    APPearance CLOUDY (*)    Hgb urine dipstick LARGE (*)    Protein, ur 100 (*)    Leukocytes,Ua TRACE (*)    All other components within normal limits  URINE CULTURE  BASIC METABOLIC PANEL  CBC  HEMOGLOBIN AND HEMATOCRIT, BLOOD  HEMOGLOBIN AND HEMATOCRIT, BLOOD     EKG  ED ECG REPORT I, Clovis Warwick J, the attending physician, personally viewed and interpreted this ECG.   Date: 09/19/2022  EKG Time: 0108  Rate: 73  Rhythm: normal sinus rhythm  Axis: Normal  Intervals:none  ST&T Change: Nonspecific    RADIOLOGY I have independently visualized and interpreted patient's CT scan as well as noted the radiology interpretation:  CT: Concerning for bladder mass, malposition Foley catheter in urethra, sacral ulcer  Official radiology report(s): CT Renal Stone Study  Result Date: 09/19/2022 CLINICAL DATA:  Bladder neck obstruction. EXAM: CT ABDOMEN AND PELVIS WITHOUT CONTRAST TECHNIQUE: Multidetector CT imaging of the abdomen and pelvis was performed following the standard protocol without IV contrast. RADIATION DOSE REDUCTION: This exam was performed according to the departmental dose-optimization program which includes automated exposure control, adjustment of the mA and/or kV according to patient size and/or use of iterative reconstruction technique. COMPARISON:  CT abdomen pelvis dated 01/12/2022. FINDINGS: Evaluation of this exam is limited in the absence of intravenous contrast. Lower chest: The visualized lung bases are clear. There is coronary vascular calcification. No intra-abdominal free air or free fluid. Hepatobiliary: Subcentimeter hypodense lesion in the right lobe of the liver is too small to  characterize but was present on the prior CT, likely a cyst. The liver is otherwise unremarkable. No biliary dilatation. The gallbladder is unremarkable. Pancreas: Unremarkable. No pancreatic ductal dilatation or surrounding inflammatory changes. Spleen: Normal in size without focal abnormality. Adrenals/Urinary Tract: The adrenal glands unremarkable. Mild bilateral renal parenchyma atrophy. There is moderate right hydronephrosis, increased since the prior CT. There is mild right hydroureter. No stone identified. There is no hydronephrosis or nephrolithiasis on the left. There is a 3.8 x 2.3 cm mass in the base of the bladder which may represent median hypertrophy of the prostate gland but concerning for a bladder urothelial neoplasm. Further evaluation with cystoscopy is recommended. Small pocket of air within the bladder likely introduced via recent catheterization. Stomach/Bowel: Postsurgical changes of the bowel with a left anterior loop colostomy. There is no bowel obstruction or active inflammation. The appendix is normal. Vascular/Lymphatic: Moderate aortoiliac atherosclerotic disease. The IVC is unremarkable. No portal venous gas. There is no adenopathy. Reproductive: The prostate gland is grossly unremarkable. A Foley catheter with compressed balloon in the bulbar urethra. Recommend retraction and repositioning. Other: None Musculoskeletal: There is deep sacral ulcer extending from the skin to the coccyx. There is slight erosion of the tip of the coccyx as seen on the prior CT, likely chronic. No drainable fluid collection or abscess. Degenerative changes of the spine. No acute osseous pathology. IMPRESSION: 1. Moderate right hydronephrosis, increased since the prior CT. No stone identified. 2. A 3.8 x 2.3 cm mass in the base of the bladder may represent protruding median hypertrophy of the prostate gland but concerning for a bladder urothelial neoplasm. Further evaluation with cystoscopy is recommended. 3.  A Foley catheter with compressed balloon in the bulbar urethra. Recommend retraction and repositioning. 4. Deep sacral ulcer extending from the skin to the coccyx. No drainable fluid collection or abscess. 5. Postsurgical changes of the bowel with a left anterior loop colostomy. No bowel obstruction. Normal appendix. 6.  Aortic Atherosclerosis (ICD10-I70.0). Electronically Signed   By: Elgie Collard M.D.   On: 09/19/2022 01:25     PROCEDURES:  Critical Care performed: Yes CRITICAL CARE Performed by: Irean Hong   Total critical care time: 30 minutes  Critical care time was exclusive of separately billable procedures and treating other patients.  Critical care was necessary to treat or prevent imminent or life-threatening deterioration.  Critical care was time spent personally by me on the following activities: development of treatment plan with patient and/or surrogate as well as nursing, discussions with consultants, evaluation of patient's response to treatment, examination of patient, obtaining history from patient or surrogate, ordering and performing treatments and interventions, ordering and review of laboratory studies, ordering and review of radiographic studies, pulse oximetry and re-evaluation of patient's condition.   Marland Kitchen1-3 Lead EKG Interpretation  Performed by: Irean Hong, MD Authorized by: Irean Hong, MD     Interpretation: normal     ECG rate:  75   ECG rate assessment: normal     Rhythm: sinus rhythm     Ectopy: none     Conduction: normal   Comments:     Placed on cardiac monitor to evaluate for arrhythmias    MEDICATIONS ORDERED IN ED: Medications  sodium chloride irrigation 0.9 % 3,000 mL (0 mLs Irrigation Stopped 09/19/22 0340)  rosuvastatin (CRESTOR) tablet 20 mg (has no administration in time range)  memantine (NAMENDA) tablet 10 mg (has no administration in time range)  QUEtiapine (SEROQUEL) tablet 50 mg (has no administration  in time range)   glipiZIDE (GLUCOTROL XL) 24 hr tablet 2.5 mg (has no administration in time range)  apixaban (ELIQUIS) tablet 5 mg (0 mg Oral Hold 09/19/22 0304)  cyanocobalamin (VITAMIN B12) tablet 3,000 mcg (has no administration in time range)  Oxcarbazepine (TRILEPTAL) tablet 300 mg (has no administration in time range)  cholecalciferol (VITAMIN D3) 25 MCG (1000 UNIT) tablet 1,000 Units (has no administration in time range)  omega-3 acid ethyl esters (LOVAZA) capsule 1 g (has no administration in time range)  0.9 %  sodium chloride infusion (has no administration in time range)  acetaminophen (TYLENOL) tablet 650 mg (has no administration in time range)    Or  acetaminophen (TYLENOL) suppository 650 mg (has no administration in time range)  traZODone (DESYREL) tablet 25 mg (has no administration in time range)  magnesium hydroxide (MILK OF MAGNESIA) suspension 30 mL (has no administration in time range)  ondansetron (ZOFRAN) tablet 4 mg (has no administration in time range)    Or  ondansetron (ZOFRAN) injection 4 mg (has no administration in time range)  sodium chloride 0.9 % bolus 1,000 mL (1,000 mLs Intravenous New Bag/Given 09/19/22 0312)  patiromer Lelon Perla) packet 8.4 g (8.4 g Oral Given 09/19/22 5366)     IMPRESSION / MDM / ASSESSMENT AND PLAN / ED COURSE  I reviewed the triage vital signs and the nursing notes.                             68 year old male presenting with hematuria; history of neurogenic bladder with chronic indwelling Foley catheters on Eliquis. Differential diagnosis includes, but is not limited to, acute appendicitis, renal colic, testicular torsion, urinary tract infection/pyelonephritis, prostatitis,  epididymitis, diverticulitis, small bowel obstruction or ileus, colitis, abdominal aortic aneurysm, gastroenteritis, hernia, etc. I personally reviewed patient's records and note his last urology office visit dated 08/13/2022.  Patient's presentation is most consistent with  acute presentation with potential threat to life or bodily function.  Bladder scan demonstrates greater than 220 mL in the bladder.  Will obtain basic lab work, insert Foley with three-way stopcock and begin continuous bladder irrigation until clear.  Clinical Course as of 09/19/22 0406  Thu Sep 19, 2022  0031 Verbal from lab potassium is 7.1; will repeat.  Sodium, creatinine also significantly changed from prior.  Will repeat BMP.  Gross blood noted on Foley catheter insertion. [JS]  0138 Repeat BMP sodium 129, potassium 5.7, BUN/creatinine 44/2.69.  EKG does not demonstrate hyperacute T waves.  Will reposition Foley catheter.  Will consult hospital services for evaluation and admission. [JS]    Clinical Course User Index [JS] Irean Hong, MD     FINAL CLINICAL IMPRESSION(S) / ED DIAGNOSES   Final diagnoses:  Hematuria, unspecified type  Pressure injury of skin of sacral region, unspecified injury stage  AKI (acute kidney injury) (HCC)  Hyperkalemia  Bladder mass     Rx / DC Orders   ED Discharge Orders     None        Note:  This document was prepared using Dragon voice recognition software and may include unintentional dictation errors.   Irean Hong, MD 09/19/22 [phone]

## 2022-09-18 NOTE — ED Triage Notes (Signed)
BIB EMS/ foley placed to today by home health nurse/ bright red blood noticed in bag by nurse/ foley removed/ abd is distended/ pt denies pain/ hx of kidney failure and recurrent UTIs

## 2022-09-18 NOTE — ED Notes (Signed)
Bladder scan volume- 212/ MD aware

## 2022-09-19 ENCOUNTER — Encounter: Payer: Self-pay | Admitting: Family Medicine

## 2022-09-19 ENCOUNTER — Emergency Department: Payer: Medicare Other

## 2022-09-19 DIAGNOSIS — N179 Acute kidney failure, unspecified: Secondary | ICD-10-CM

## 2022-09-19 DIAGNOSIS — E785 Hyperlipidemia, unspecified: Secondary | ICD-10-CM | POA: Diagnosis present

## 2022-09-19 DIAGNOSIS — L89153 Pressure ulcer of sacral region, stage 3: Secondary | ICD-10-CM

## 2022-09-19 DIAGNOSIS — E1122 Type 2 diabetes mellitus with diabetic chronic kidney disease: Secondary | ICD-10-CM | POA: Diagnosis present

## 2022-09-19 DIAGNOSIS — Z8616 Personal history of COVID-19: Secondary | ICD-10-CM | POA: Diagnosis not present

## 2022-09-19 DIAGNOSIS — F0390 Unspecified dementia without behavioral disturbance: Secondary | ICD-10-CM | POA: Diagnosis not present

## 2022-09-19 DIAGNOSIS — R310 Gross hematuria: Secondary | ICD-10-CM | POA: Diagnosis not present

## 2022-09-19 DIAGNOSIS — E875 Hyperkalemia: Secondary | ICD-10-CM

## 2022-09-19 DIAGNOSIS — E871 Hypo-osmolality and hyponatremia: Secondary | ICD-10-CM | POA: Diagnosis present

## 2022-09-19 DIAGNOSIS — L89150 Pressure ulcer of sacral region, unstageable: Secondary | ICD-10-CM | POA: Diagnosis present

## 2022-09-19 DIAGNOSIS — F0283 Dementia in other diseases classified elsewhere, unspecified severity, with mood disturbance: Secondary | ICD-10-CM | POA: Diagnosis present

## 2022-09-19 DIAGNOSIS — N1330 Unspecified hydronephrosis: Secondary | ICD-10-CM | POA: Diagnosis present

## 2022-09-19 DIAGNOSIS — T83021A Displacement of indwelling urethral catheter, initial encounter: Secondary | ICD-10-CM | POA: Diagnosis present

## 2022-09-19 DIAGNOSIS — I130 Hypertensive heart and chronic kidney disease with heart failure and stage 1 through stage 4 chronic kidney disease, or unspecified chronic kidney disease: Secondary | ICD-10-CM | POA: Diagnosis present

## 2022-09-19 DIAGNOSIS — I10 Essential (primary) hypertension: Secondary | ICD-10-CM | POA: Diagnosis not present

## 2022-09-19 DIAGNOSIS — L89159 Pressure ulcer of sacral region, unspecified stage: Secondary | ICD-10-CM | POA: Diagnosis not present

## 2022-09-19 DIAGNOSIS — Y738 Miscellaneous gastroenterology and urology devices associated with adverse incidents, not elsewhere classified: Secondary | ICD-10-CM | POA: Diagnosis present

## 2022-09-19 DIAGNOSIS — L89892 Pressure ulcer of other site, stage 2: Secondary | ICD-10-CM | POA: Diagnosis present

## 2022-09-19 DIAGNOSIS — D631 Anemia in chronic kidney disease: Secondary | ICD-10-CM | POA: Diagnosis present

## 2022-09-19 DIAGNOSIS — Z515 Encounter for palliative care: Secondary | ICD-10-CM | POA: Diagnosis not present

## 2022-09-19 DIAGNOSIS — E119 Type 2 diabetes mellitus without complications: Secondary | ICD-10-CM

## 2022-09-19 DIAGNOSIS — Z7189 Other specified counseling: Secondary | ICD-10-CM | POA: Diagnosis not present

## 2022-09-19 DIAGNOSIS — R319 Hematuria, unspecified: Secondary | ICD-10-CM

## 2022-09-19 DIAGNOSIS — N1832 Chronic kidney disease, stage 3b: Secondary | ICD-10-CM | POA: Diagnosis present

## 2022-09-19 DIAGNOSIS — Z87820 Personal history of traumatic brain injury: Secondary | ICD-10-CM | POA: Diagnosis not present

## 2022-09-19 DIAGNOSIS — I5033 Acute on chronic diastolic (congestive) heart failure: Secondary | ICD-10-CM | POA: Diagnosis present

## 2022-09-19 DIAGNOSIS — N189 Chronic kidney disease, unspecified: Secondary | ICD-10-CM

## 2022-09-19 DIAGNOSIS — F3181 Bipolar II disorder: Secondary | ICD-10-CM | POA: Diagnosis present

## 2022-09-19 DIAGNOSIS — N3289 Other specified disorders of bladder: Secondary | ICD-10-CM

## 2022-09-19 DIAGNOSIS — G3109 Other frontotemporal dementia: Secondary | ICD-10-CM | POA: Diagnosis present

## 2022-09-19 DIAGNOSIS — Z6841 Body Mass Index (BMI) 40.0 and over, adult: Secondary | ICD-10-CM | POA: Diagnosis not present

## 2022-09-19 DIAGNOSIS — Z7901 Long term (current) use of anticoagulants: Secondary | ICD-10-CM | POA: Diagnosis not present

## 2022-09-19 DIAGNOSIS — E8722 Chronic metabolic acidosis: Secondary | ICD-10-CM | POA: Diagnosis present

## 2022-09-19 DIAGNOSIS — Z79899 Other long term (current) drug therapy: Secondary | ICD-10-CM | POA: Diagnosis not present

## 2022-09-19 LAB — BASIC METABOLIC PANEL
Anion gap: 2 — ABNORMAL LOW (ref 5–15)
Anion gap: 5 (ref 5–15)
Anion gap: 8 (ref 5–15)
BUN: 44 mg/dL — ABNORMAL HIGH (ref 8–23)
BUN: 45 mg/dL — ABNORMAL HIGH (ref 8–23)
BUN: 45 mg/dL — ABNORMAL HIGH (ref 8–23)
CO2: 16 mmol/L — ABNORMAL LOW (ref 22–32)
CO2: 18 mmol/L — ABNORMAL LOW (ref 22–32)
CO2: 18 mmol/L — ABNORMAL LOW (ref 22–32)
Calcium: 7.9 mg/dL — ABNORMAL LOW (ref 8.9–10.3)
Calcium: 7.9 mg/dL — ABNORMAL LOW (ref 8.9–10.3)
Calcium: 8 mg/dL — ABNORMAL LOW (ref 8.9–10.3)
Chloride: 103 mmol/L (ref 98–111)
Chloride: 104 mmol/L (ref 98–111)
Chloride: 107 mmol/L (ref 98–111)
Creatinine, Ser: 2.68 mg/dL — ABNORMAL HIGH (ref 0.61–1.24)
Creatinine, Ser: 2.69 mg/dL — ABNORMAL HIGH (ref 0.61–1.24)
Creatinine, Ser: 2.94 mg/dL — ABNORMAL HIGH (ref 0.61–1.24)
GFR, Estimated: 23 mL/min — ABNORMAL LOW (ref >60)
GFR, Estimated: 25 mL/min — ABNORMAL LOW (ref >60)
GFR, Estimated: 25 mL/min — ABNORMAL LOW (ref >60)
Glucose, Bld: 140 mg/dL — ABNORMAL HIGH (ref 70–99)
Glucose, Bld: 145 mg/dL — ABNORMAL HIGH (ref 70–99)
Glucose, Bld: 155 mg/dL — ABNORMAL HIGH (ref 70–99)
Potassium: 5.5 mmol/L — ABNORMAL HIGH (ref 3.5–5.1)
Potassium: 5.7 mmol/L — ABNORMAL HIGH (ref 3.5–5.1)
Potassium: 7.1 mmol/L (ref 3.5–5.1)
Sodium: 125 mmol/L — ABNORMAL LOW (ref 135–145)
Sodium: 127 mmol/L — ABNORMAL LOW (ref 135–145)
Sodium: 129 mmol/L — ABNORMAL LOW (ref 135–145)

## 2022-09-19 LAB — CBG MONITORING, ED
Glucose-Capillary: 104 mg/dL — ABNORMAL HIGH (ref 70–99)
Glucose-Capillary: 128 mg/dL — ABNORMAL HIGH (ref 70–99)
Glucose-Capillary: 101 mg/dL — ABNORMAL HIGH (ref 70–99)
Glucose-Capillary: 126 mg/dL — ABNORMAL HIGH (ref 70–99)

## 2022-09-19 LAB — OSMOLALITY: Osmolality: 298 mOsm/kg — ABNORMAL HIGH (ref 275–295)

## 2022-09-19 LAB — HEMOGLOBIN AND HEMATOCRIT, BLOOD
HCT: 26.2 % — ABNORMAL LOW (ref 39.0–52.0)
HCT: 26.8 % — ABNORMAL LOW (ref 39.0–52.0)
Hemoglobin: 8 g/dL — ABNORMAL LOW (ref 13.0–17.0)
Hemoglobin: 8.2 g/dL — ABNORMAL LOW (ref 13.0–17.0)

## 2022-09-19 LAB — URINALYSIS, W/ REFLEX TO CULTURE (INFECTION SUSPECTED)
Bacteria, UA: NONE SEEN
Bilirubin Urine: NEGATIVE
Glucose, UA: NEGATIVE mg/dL
Ketones, ur: NEGATIVE mg/dL
Nitrite: NEGATIVE
Protein, ur: 100 mg/dL — AB
RBC / HPF: >50 RBC/hpf (ref 0–5)
Specific Gravity, Urine: 1.015 (ref 1.005–1.030)
Squamous Epithelial / HPF: NONE SEEN /HPF (ref 0–5)
pH: 6 (ref 5.0–8.0)

## 2022-09-19 LAB — CBC
HCT: 26.4 % — ABNORMAL LOW (ref 39.0–52.0)
Hemoglobin: 8.2 g/dL — ABNORMAL LOW (ref 13.0–17.0)
MCH: 25.9 pg — ABNORMAL LOW (ref 26.0–34.0)
MCHC: 31.1 g/dL (ref 30.0–36.0)
MCV: 83.5 fL (ref 80.0–100.0)
Platelets: 193 10*3/uL (ref 150–400)
RBC: 3.16 MIL/uL — ABNORMAL LOW (ref 4.22–5.81)
RDW: 17.9 % — ABNORMAL HIGH (ref 11.5–15.5)
WBC: 10.1 10*3/uL (ref 4.0–10.5)
nRBC: 0 % (ref 0.0–0.2)

## 2022-09-19 LAB — HEMOGLOBIN A1C
Hgb A1c MFr Bld: 7 % — ABNORMAL HIGH (ref 4.8–5.6)
Mean Plasma Glucose: 154 mg/dL

## 2022-09-19 LAB — VITAMIN D 25 HYDROXY (VIT D DEFICIENCY, FRACTURES): Vit D, 25-Hydroxy: 18.68 ng/mL — ABNORMAL LOW (ref 30–100)

## 2022-09-19 MED ORDER — QUETIAPINE FUMARATE 25 MG PO TABS: 50.0000 mg | ORAL_TABLET | Freq: Every day | ORAL | Status: DC | PRN

## 2022-09-19 MED ORDER — SODIUM ZIRCONIUM CYCLOSILICATE 10 G PO PACK
10.0000 g | PACK | Freq: Three times a day (TID) | ORAL | Status: AC
  Administered 2022-09-19: 10 g via ORAL
  Filled 2022-09-19 (×2): qty 1

## 2022-09-19 MED ORDER — OMEGA-3-ACID ETHYL ESTERS 1 G PO CAPS
1.0000 | ORAL_CAPSULE | Freq: Every day | ORAL | Status: DC
  Administered 2022-09-19 – 2022-09-25 (×5): 1 g via ORAL
  Filled 2022-09-19 (×6): qty 1

## 2022-09-19 MED ORDER — SODIUM CHLORIDE 0.9 % IR SOLN
3000.0000 mL | Status: DC
  Administered 2022-09-19: 3000 mL

## 2022-09-19 MED ORDER — MEMANTINE HCL 10 MG PO TABS
10.0000 mg | ORAL_TABLET | Freq: Two times a day (BID) | ORAL | Status: DC
  Administered 2022-09-19 – 2022-09-25 (×11): 10 mg via ORAL
  Filled 2022-09-19 (×4): qty 1
  Filled 2022-09-19: qty 2
  Filled 2022-09-19: qty 1
  Filled 2022-09-19: qty 2
  Filled 2022-09-19: qty 1
  Filled 2022-09-19: qty 2
  Filled 2022-09-19 (×3): qty 1

## 2022-09-19 MED ORDER — INSULIN ASPART 100 UNIT/ML IJ SOLN
0.0000 [IU] | Freq: Three times a day (TID) | INTRAMUSCULAR | Status: DC
  Administered 2022-09-19 – 2022-09-21 (×4): 1 [IU] via SUBCUTANEOUS
  Administered 2022-09-21 – 2022-09-22 (×2): 2 [IU] via SUBCUTANEOUS
  Administered 2022-09-22 – 2022-09-23 (×2): 1 [IU] via SUBCUTANEOUS
  Administered 2022-09-23: 2 [IU] via SUBCUTANEOUS
  Administered 2022-09-24 – 2022-09-25 (×5): 1 [IU] via SUBCUTANEOUS
  Filled 2022-09-19 (×14): qty 1

## 2022-09-19 MED ORDER — VITAMIN D 25 MCG (1000 UNIT) PO TABS
1000.0000 [IU] | ORAL_TABLET | Freq: Every day | ORAL | Status: DC
  Administered 2022-09-19 – 2022-09-25 (×5): 1000 [IU] via ORAL
  Filled 2022-09-19 (×6): qty 1

## 2022-09-19 MED ORDER — MAGNESIUM HYDROXIDE 400 MG/5ML PO SUSP: 30.0000 mL | Freq: Every day | ORAL | Status: DC | PRN

## 2022-09-19 MED ORDER — MUPIROCIN 2 % EX OINT
TOPICAL_OINTMENT | Freq: Every day | CUTANEOUS | Status: DC
  Filled 2022-09-19 (×3): qty 22

## 2022-09-19 MED ORDER — ACETAMINOPHEN 325 MG PO TABS: 650.0000 mg | ORAL_TABLET | Freq: Four times a day (QID) | ORAL | Status: DC | PRN

## 2022-09-19 MED ORDER — APIXABAN 5 MG PO TABS
5.0000 mg | ORAL_TABLET | Freq: Two times a day (BID) | ORAL | Status: DC
  Administered 2022-09-19: 5 mg via ORAL
  Filled 2022-09-19 (×2): qty 1

## 2022-09-19 MED ORDER — SODIUM CHLORIDE 0.9 % IV BOLUS
1000.0000 mL | Freq: Once | INTRAVENOUS | Status: AC
  Administered 2022-09-19: 1000 mL via INTRAVENOUS

## 2022-09-19 MED ORDER — ROSUVASTATIN CALCIUM 10 MG PO TABS
20.0000 mg | ORAL_TABLET | Freq: Every day | ORAL | Status: DC
  Administered 2022-09-19 – 2022-09-25 (×5): 20 mg via ORAL
  Filled 2022-09-19: qty 1
  Filled 2022-09-19 (×4): qty 2
  Filled 2022-09-19: qty 1

## 2022-09-19 MED ORDER — ONDANSETRON HCL 4 MG/2ML IJ SOLN: 4.0000 mg | Freq: Four times a day (QID) | INTRAMUSCULAR | Status: DC | PRN

## 2022-09-19 MED ORDER — OXCARBAZEPINE 300 MG PO TABS
300.0000 mg | ORAL_TABLET | Freq: Two times a day (BID) | ORAL | Status: DC
  Administered 2022-09-19 – 2022-09-25 (×11): 300 mg via ORAL
  Filled 2022-09-19 (×13): qty 1

## 2022-09-19 MED ORDER — TRAZODONE HCL 50 MG PO TABS
25.0000 mg | ORAL_TABLET | Freq: Every evening | ORAL | Status: DC | PRN
  Administered 2022-09-22 – 2022-09-24 (×3): 25 mg via ORAL
  Filled 2022-09-19 (×2): qty 1

## 2022-09-19 MED ORDER — FUROSEMIDE 10 MG/ML IJ SOLN
4.0000 mg/h | INTRAVENOUS | Status: DC
  Administered 2022-09-19: 4 mg/h via INTRAVENOUS
  Filled 2022-09-19: qty 20

## 2022-09-19 MED ORDER — ACETAMINOPHEN 650 MG RE SUPP: 650.0000 mg | Freq: Four times a day (QID) | RECTAL | Status: DC | PRN

## 2022-09-19 MED ORDER — ONDANSETRON HCL 4 MG PO TABS: 4.0000 mg | ORAL_TABLET | Freq: Four times a day (QID) | ORAL | Status: DC | PRN

## 2022-09-19 MED ORDER — SODIUM CHLORIDE 0.9 % IV SOLN: INTRAVENOUS | Status: DC

## 2022-09-19 MED ORDER — SODIUM CHLORIDE 0.9 % IV SOLN
1.0000 g | INTRAVENOUS | Status: DC
  Administered 2022-09-19 – 2022-09-23 (×5): 1 g via INTRAVENOUS
  Filled 2022-09-19 (×6): qty 10

## 2022-09-19 MED ORDER — GLIPIZIDE ER 2.5 MG PO TB24
2.5000 mg | ORAL_TABLET | Freq: Every day | ORAL | Status: DC
  Filled 2022-09-19: qty 1

## 2022-09-19 MED ORDER — SODIUM BICARBONATE 650 MG PO TABS
650.0000 mg | ORAL_TABLET | Freq: Three times a day (TID) | ORAL | Status: DC
  Administered 2022-09-19 – 2022-09-21 (×5): 650 mg via ORAL
  Filled 2022-09-19 (×9): qty 1

## 2022-09-19 MED ORDER — INSULIN ASPART 100 UNIT/ML IJ SOLN: 0.0000 [IU] | Freq: Every day | INTRAMUSCULAR | Status: DC

## 2022-09-19 MED ORDER — PATIROMER SORBITEX CALCIUM 8.4 G PO PACK
8.4000 g | PACK | Freq: Once | ORAL | Status: AC
  Administered 2022-09-19: 8.4 g via ORAL
  Filled 2022-09-19: qty 1

## 2022-09-19 MED ORDER — VITAMIN B-12 1000 MCG PO TABS
3000.0000 ug | ORAL_TABLET | Freq: Every day | ORAL | Status: DC
  Administered 2022-09-19 – 2022-09-25 (×5): 3000 ug via ORAL
  Filled 2022-09-19: qty 3
  Filled 2022-09-19 (×2): qty 6
  Filled 2022-09-19 (×3): qty 3

## 2022-09-19 NOTE — Assessment & Plan Note (Addendum)
-   This is likely prerenal possibly secondary to her bladder mass. - This is associated with hyperkalemia. - She will be hydrated with IV normal saline and will follow BMP. - We will hold off nephrotoxins.

## 2022-09-19 NOTE — Evaluation (Signed)
Physical Therapy Evaluation Patient Details Name: Okie Jeffords MRN: [id number] DOB: 04-23-1956 Today's Date: 09/19/2022  History of Present Illness  Gilmer Rabuck is a 67 y.o. African-American male with medical history significant for bipolar disorder, type 2 diabetes mellitus without complications, hypertension, TBI, and neurogenic bladder with chronic indwelling Foley cath, who presented to the emergency room with acute onset of hematuria.   Clinical Impression  Patient received in bed, sleeping. He wakes easily when light turned on. Patient is agreeable to PT/OT evaluations. He required +2 total assist for supine to sit and to achieve safe sitting edge of stretcher. Patient is able to partially stand with +2 max assist. He will continue to benefit from skilled PT to improve functional independence and to reduce caregiver burden.          Recommendations for follow up therapy are one component of a multi-disciplinary discharge planning process, led by the attending physician.  Recommendations may be updated based on patient status, additional functional criteria and insurance authorization.  Follow Up Recommendations Skilled nursing-short term rehab (<3 hours/day) Can patient physically be transported by private vehicle: No    Assistance Recommended at Discharge Frequent or constant Supervision/Assistance  Patient can return home with the following  Two people to help with walking and/or transfers;A lot of help with bathing/dressing/bathroom;Direct supervision/assist for medications management;Assist for transportation    Equipment Recommendations None recommended by PT  Recommendations for Other Services       Functional Status Assessment Patient has had a recent decline in their functional status and/or demonstrates limited ability to make significant improvements in function in a reasonable and predictable amount of time     Precautions / Restrictions  Precautions Precautions: Fall Restrictions Weight Bearing Restrictions: No      Mobility  Bed Mobility Overal bed mobility: Needs Assistance Bed Mobility: Supine to Sit, Sit to Supine     Supine to sit: +2 for physical assistance, Total assist Sit to supine: Total assist, +2 for physical assistance   General bed mobility comments: patient is very stiff with mobility    Transfers Overall transfer level: Needs assistance Equipment used: 2 person hand held assist Transfers: Sit to/from Stand Sit to Stand: Max assist, +2 physical assistance, From elevated surface, +2 safety/equipment           General transfer comment: clears rear but does not acheive upright posture    Ambulation/Gait               General Gait Details: not ambulatory at baseline  Stairs            Wheelchair Mobility    Modified Rankin (Stroke Patients Only)       Balance Overall balance assessment: Needs assistance Sitting-balance support: Feet unsupported, Bilateral upper extremity supported Sitting balance-Leahy Scale: Fair Sitting balance - Comments: requires cues for hand placement to assist   Standing balance support: Bilateral upper extremity supported, Reliant on assistive device for balance Standing balance-Leahy Scale: Poor                               Pertinent Vitals/Pain Pain Assessment Pain Assessment: No/denies pain    Home Living Family/patient expects to be discharged to:: Private residence Living Arrangements: Spouse/significant other Available Help at Discharge: Family;Available PRN/intermittently Type of Home: House Home Access: Ramped entrance       Home Layout: One level Home Equipment: Wheelchair - manual;Wheelchair - power;Hospital bed;Shower Land (2  wheels) Additional Comments: home setup per chart    Prior Function Prior Level of Function : Needs assist       Physical Assist : Mobility (physical);ADLs  (physical) Mobility (physical): Bed mobility;Transfers ADLs (physical): IADLs;Toileting;Dressing;Bathing Mobility Comments: Spouse assists with bed mobility and SPT to/from WC. Reports needing assist for WC mobility. spends most of his day in recliner ADLs Comments: Spouse assists with dressing, bathing, and IADLs. sink bath primarily.     Hand Dominance   Dominant Hand: Right    Extremity/Trunk Assessment   Upper Extremity Assessment Upper Extremity Assessment: Defer to OT evaluation    Lower Extremity Assessment Lower Extremity Assessment: Generalized weakness    Cervical / Trunk Assessment Cervical / Trunk Assessment: Normal  Communication   Communication: No difficulties  Cognition Arousal/Alertness: Awake/alert Behavior During Therapy: WFL for tasks assessed/performed Overall Cognitive Status: History of cognitive impairments - at baseline                                          General Comments      Exercises     Assessment/Plan    PT Assessment Patient needs continued PT services  PT Problem List Decreased strength;Decreased range of motion;Decreased activity tolerance;Decreased balance;Decreased mobility;Obesity;Decreased cognition       PT Treatment Interventions Functional mobility training;Therapeutic activities;Patient/family education;Therapeutic exercise;Cognitive remediation;Neuromuscular re-education    PT Goals (Current goals can be found in the Care Plan section)  Acute Rehab PT Goals Patient Stated Goal: to return home with wife assist PT Goal Formulation: With patient Time For Goal Achievement: 10/03/22 Potential to Achieve Goals: Fair    Frequency Min 2X/week     Co-evaluation PT/OT/SLP Co-Evaluation/Treatment: Yes Reason for Co-Treatment: To address functional/ADL transfers;Necessary to address cognition/behavior during functional activity;For patient/therapist safety PT goals addressed during session: Mobility/safety  with mobility;Balance OT goals addressed during session: ADL's and self-care       AM-PAC PT "6 Clicks" Mobility  Outcome Measure Help needed turning from your back to your side while in a flat bed without using bedrails?: Total Help needed moving from lying on your back to sitting on the side of a flat bed without using bedrails?: Total Help needed moving to and from a bed to a chair (including a wheelchair)?: Total Help needed standing up from a chair using your arms (e.g., wheelchair or bedside chair)?: Total Help needed to walk in hospital room?: Total Help needed climbing 3-5 steps with a railing? : Total 6 Click Score: 6    End of Session   Activity Tolerance: Patient tolerated treatment well Patient left: in bed;with call bell/phone within reach Nurse Communication: Mobility status PT Visit Diagnosis: Other abnormalities of gait and mobility (R26.89);Muscle weakness (generalized) (M62.81)    Time: [id number] PT Time Calculation (min) (ACUTE ONLY): 12 min   Charges:   PT Evaluation $PT Eval Moderate Complexity: 1 Mod          Jermichael Belmares, PT, GCS 09/19/22,2:30 PM

## 2022-09-19 NOTE — Assessment & Plan Note (Addendum)
-   We will continue her antihypertensives. 

## 2022-09-19 NOTE — ED Notes (Addendum)
Patient was moved to a hospital bed. Patient had a soiled mepilex dressing in the sacral area, with blue small foam used to pack sacral wound. Dressing and foam were removed due to being heavily soiled. 1 wet guaze was placed in the sacral area and packed to the wound. New mepilex dressing was applied. Patient was also positioned with pillow on the right side.

## 2022-09-19 NOTE — Assessment & Plan Note (Signed)
-   We will continue statin therapy. 

## 2022-09-19 NOTE — H&P (Addendum)
[place]   PATIENT NAME: [name]    MR#:  [id number]  DATE OF BIRTH:  03/26/1956  DATE OF ADMISSION:  09/18/2022  PRIMARY CARE PHYSICIAN: [name] Nian, MD   Patient is coming from: Home  REQUESTING/REFERRING PHYSICIAN: Lurline Hare, MD  CHIEF COMPLAINT:   Chief Complaint  Patient presents with   Hematuria    HISTORY OF PRESENT ILLNESS:  [name] is a 67 y.o. African-American male with medical history significant for bipolar disorder, type 2 diabetes mellitus without complications, hypertension, TBI, and neurogenic bladder with chronic indwelling Foley cath, who presented to the emergency room with acute onset of hematuria.  He admitted to mild cough without dyspnea or wheezing.  He denied any nausea or vomiting or diarrhea or abdominal pain.  No fever or chills.  No chest pain or palpitations.  No presyncope or syncope.  He denied any orthopnea or paroxysmal nocturnal dyspnea or worsening lower extremity edema.  ED Course: When the patient into the ER, vital signs were within normal.  Labs revealed hyponatremia of 129 and potassium of 7.1 that was likely hemolyzed and repeat level was 5.7.  CO2 16 and later 18 and blood glucose 145, BUN 44 and creatinine 2.69 with calcium of 7.9.  CBC showed anemia better than previous levels.  UA showed 11-20 WBCs with more than 50 RBCs and trace leukocytes. EKG as reviewed by me : Normal sinus rhythm with a rate of 72. Imaging: Two-view chest x-ray showed mild vascular congestion without edema and mild bibasilar atelectasis. CT renal stone showed the following: 1. Moderate right hydronephrosis, increased since the prior CT. No stone identified. 2. A 3.8 x 2.3 cm mass in the base of the bladder may represent protruding median hypertrophy of the prostate gland but concerning for a bladder urothelial neoplasm. Further evaluation with cystoscopy is recommended. 3. A Foley catheter with compressed balloon in the  bulbar urethra. Recommend retraction and repositioning. 4. Deep sacral ulcer extending from the skin to the coccyx. No drainable fluid collection or abscess. 5. Postsurgical changes of the bowel with a left anterior loop colostomy. No bowel obstruction. Normal appendix. 6.  Aortic Atherosclerosis.  The patient was given 8.4 g of p.o. [place] and 1 L bolus of IV normal saline.  He will be admitted to a medical telemetry bed for further evaluation and management. PAST MEDICAL HISTORY:   Past Medical History:  Diagnosis Date   Bipolar disorder ([place])    Diabetes mellitus without complication ([place])    History of blood clots    Hypertension    TBI (traumatic brain injury) ([place])     PAST SURGICAL HISTORY:   Past Surgical History:  Procedure Laterality Date   BACK SURGERY     CARPAL TUNNEL RELEASE Bilateral    COLON SURGERY     COLOSTOMY     IR PERC TUN PERIT CATH WO PORT S&I /IMAG  10/11/2021   IR REMOVAL TUN CV CATH W/O FL  11/19/2021   IR US GUIDE VASC ACCESS RIGHT  10/11/2021   TONSILLECTOMY      SOCIAL HISTORY:   Social History   Tobacco Use   Smoking status: Never    Passive exposure: Never   Smokeless tobacco: Never  Substance Use Topics   Alcohol use: Not Currently    FAMILY HISTORY:   Family History  Problem Relation Age of Onset   Depression Father    Deep vein thrombosis Father     DRUG ALLERGIES:  No Known Allergies  REVIEW OF SYSTEMS:   ROS As per history of present illness. All pertinent systems were reviewed above. Constitutional, HEENT, cardiovascular, respiratory, GI, GU, musculoskeletal, neuro, psychiatric, endocrine, integumentary and hematologic systems were reviewed and are otherwise negative/unremarkable except for positive findings mentioned above in the HPI.   MEDICATIONS AT HOME:   Prior to Admission medications   Medication Sig Start Date End Date Taking? Authorizing Provider  amLODipine-olmesartan (AZOR) 5-20 MG tablet Take 1 tablet  by mouth daily. 01/08/22   [provider]  BACTRIM DS 800-160 MG tablet Take 1 tablet by mouth 2 (two) times daily. 05/28/22   [provider]  cholecalciferol (VITAMIN D) 25 MCG tablet Take 1 tablet (1,000 Units total) by mouth daily. 07/31/21   [name] Mandes, MD  Continuous Blood Gluc Receiver ([place]) DEVI UAD for blood sugar monitoring. 01/23/22   [provider]  ELIQUIS 5 MG TABS tablet Take 5 mg by mouth 2 (two) times daily.  11/17/18   [provider]  fish oil-omega-3 fatty acids 1000 MG capsule SMARTSIG:1 Capsule(s) By Mouth Daily 10/12/21   [provider]  glipiZIDE (GLUCOTROL XL) 2.5 MG 24 hr tablet Take 1 tablet (2.5 mg total) by mouth daily with breakfast. 06/25/21   [name] Grayer, MD  memantine (NAMENDA) 10 MG tablet Take 10 mg by mouth 2 (two) times daily.    [provider]  Oxcarbazepine (TRILEPTAL) 300 MG tablet Take 1 tablet (300 mg total) by mouth 2 (two) times daily. 06/04/22   [name] Alert, MD  QUEtiapine (SEROQUEL) 50 MG tablet TAKE 1/2 TO 1 (ONE-HALF TO ONE) TABLET BY MOUTH ONCE DAILY PRN  AGITATION 06/26/22   [name] Alert, MD  rosuvastatin (CRESTOR) 20 MG tablet Take 20 mg by mouth daily.  08/29/17   [provider]  vitamin B-12 (CYANOCOBALAMIN) 1000 MCG tablet Take 3,000 mcg by mouth daily.    [provider]      VITAL SIGNS:  Blood pressure 127/65, pulse 68, temperature 98.1 F (36.7 C), temperature source Oral, resp. rate 19, weight 128.7 kg, SpO2 99 %.  PHYSICAL EXAMINATION:  Physical Exam  GENERAL:  66 y.o.-year-old African-American male patient lying in the bed with no acute distress.  EYES: Pupils equal, round, reactive to light and accommodation. No scleral icterus. Extraocular muscles intact.  HEENT: Head atraumatic, normocephalic. Oropharynx and nasopharynx clear.  NECK:  Supple, no jugular venous distention. No thyroid enlargement, no tenderness.  LUNGS: Normal breath  sounds bilaterally, no wheezing, rales,rhonchi or crepitation. No use of accessory muscles of respiration.  CARDIOVASCULAR: Regular rate and rhythm, S1, S2 normal. No murmurs, rubs, or gallops.  ABDOMEN: Soft, nondistended, nontender. Bowel sounds present. No organomegaly or mass.  EXTREMITIES: No pedal edema, cyanosis, or clubbing.  NEUROLOGIC: Cranial nerves II through XII are intact. Muscle strength 5/5 in all extremities. Sensation intact. Gait not checked.  PSYCHIATRIC: The patient is [name] and oriented x 3.  Normal affect and good eye contact. SKIN: Stage III sacral decubitus ulcer.  LABORATORY PANEL:   CBC Recent Labs  Lab 09/18/22 2347  WBC 9.5  HGB 8.8*  HCT 28.5*  PLT 214   ------------------------------------------------------------------------------------------------------------------  Chemistries  Recent Labs  Lab 09/19/22 0039  NA 129*  K 5.7*  CL 103  CO2 18*  GLUCOSE 145*  BUN 44*  CREATININE 2.69*  CALCIUM 7.9*   ------------------------------------------------------------------------------------------------------------------  Cardiac Enzymes No results for input(s): "TROPONINI" in the last 168 hours. ------------------------------------------------------------------------------------------------------------------  RADIOLOGY:  CT Renal  Stone Study  Result Date: 09/19/2022 CLINICAL DATA:  Bladder neck obstruction. EXAM: CT ABDOMEN AND PELVIS WITHOUT CONTRAST TECHNIQUE: Multidetector CT imaging of the abdomen and pelvis was performed following the standard protocol without IV contrast. RADIATION DOSE REDUCTION: This exam was performed according to the departmental dose-optimization program which includes automated exposure control, adjustment of the mA and/or kV according to patient size and/or use of iterative reconstruction technique. COMPARISON:  CT abdomen pelvis dated 01/12/2022. FINDINGS: Evaluation of this exam is limited in the absence of intravenous  contrast. Lower chest: The visualized lung bases are clear. There is coronary vascular calcification. No intra-abdominal free air or free fluid. Hepatobiliary: Subcentimeter hypodense lesion in the right lobe of the liver is too small to characterize but was present on the prior CT, likely a cyst. The liver is otherwise unremarkable. No biliary dilatation. The gallbladder is unremarkable. Pancreas: Unremarkable. No pancreatic ductal dilatation or surrounding inflammatory changes. Spleen: Normal in size without focal abnormality. Adrenals/Urinary Tract: The adrenal glands unremarkable. Mild bilateral renal parenchyma atrophy. There is moderate right hydronephrosis, increased since the prior CT. There is mild right hydroureter. No stone identified. There is no hydronephrosis or nephrolithiasis on the left. There is a 3.8 x 2.3 cm mass in the base of the bladder which may represent median hypertrophy of the prostate gland but concerning for a bladder urothelial neoplasm. Further evaluation with cystoscopy is recommended. Small pocket of air within the bladder likely introduced via recent catheterization. Stomach/Bowel: Postsurgical changes of the bowel with a left anterior loop colostomy. There is no bowel obstruction or active inflammation. The appendix is normal. Vascular/Lymphatic: Moderate aortoiliac atherosclerotic disease. The IVC is unremarkable. No portal venous gas. There is no adenopathy. Reproductive: The prostate gland is grossly unremarkable. A Foley catheter with compressed balloon in the bulbar urethra. Recommend retraction and repositioning. Other: None Musculoskeletal: There is deep sacral ulcer extending from the skin to the coccyx. There is slight erosion of the tip of the coccyx as seen on the prior CT, likely chronic. No drainable fluid collection or abscess. Degenerative changes of the spine. No acute osseous pathology. IMPRESSION: 1. Moderate right hydronephrosis, increased since the prior CT. No  stone identified. 2. A 3.8 x 2.3 cm mass in the base of the bladder may represent protruding median hypertrophy of the prostate gland but concerning for a bladder urothelial neoplasm. Further evaluation with cystoscopy is recommended. 3. A Foley catheter with compressed balloon in the bulbar urethra. Recommend retraction and repositioning. 4. Deep sacral ulcer extending from the skin to the coccyx. No drainable fluid collection or abscess. 5. Postsurgical changes of the bowel with a left anterior loop colostomy. No bowel obstruction. Normal appendix. 6.  Aortic Atherosclerosis (ICD10-I70.0). Electronically Signed   By: Anner Crete M.D.   On: 09/19/2022 01:25      IMPRESSION AND PLAN:  Assessment and Plan: * Hematuria - This is clearly secondary to her bladder mass.  The patient has associated right hydronephrosis. - The patient will be admitted to a medical telemetry bed. - We will follow serial hemoglobins and hematocrits. - We will hold off Eliquis. - We will continue continuous bladder irrigation. - Urology consult to be obtained. - I notified Dr. Erlene Quan about the patient.  Acute kidney injury superimposed on chronic kidney disease ([place]) - This is likely prerenal possibly secondary to her bladder mass. - This is associated with hyperkalemia. - She will be hydrated with IV normal saline and will follow BMP. - We will hold  off nephrotoxins.  Hyponatremia - This is likely hypovolemic. - The patient will be hydrated with IV normal saline and will follow BMP.  Sacral decubitus ulcer - Wound care consult to be obtained.  Dyslipidemia - We will continue statin therapy.  Essential hypertension - We will continue her antihypertensives.  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  Type 2 diabetes mellitus without complications ([place]) - She will be placed on supplemental coverage with  NovoLog.   DVT prophylaxis: SCDs. Advanced Care Planning:  Code Status: full code.  Family Communication:  The plan of care was discussed in details with the patient (and family). I answered all questions. The patient agreed to proceed with the above mentioned plan. Further management will depend upon hospital course. Disposition Plan: Back to previous home environment Consults called: Urology. All the records are reviewed and case discussed with ED provider.  Status is: Inpatient   At the time of the admission, it appears that the appropriate admission status for this patient is inpatient.  This is judged to be reasonable and necessary in order to provide the required intensity of service to ensure the patient's safety given the presenting symptoms, physical exam findings and initial radiographic and laboratory data in the context of comorbid conditions.  The patient requires inpatient status due to high intensity of service, high risk of further deterioration and high frequency of surveillance required.  I certify that at the time of admission, it is my clinical judgment that the patient will require inpatient hospital care extending more than 2 midnights.                            Dispo: The patient is from: Home              Anticipated d/c is to: Home              Patient currently is not medically stable to d/c.              Difficult to place patient: No  Christel Mormon M.D on 09/19/2022 at 5:10 AM  [facility]   From 7 PM-7 AM, contact night-coverage [email or web address]  CC: Primary care physician; [name] Nian, MD

## 2022-09-19 NOTE — Assessment & Plan Note (Signed)
-   She will be placed on supplemental coverage with NovoLog.

## 2022-09-19 NOTE — Assessment & Plan Note (Signed)
-   Wound care consult to be obtained. 

## 2022-09-19 NOTE — Assessment & Plan Note (Addendum)
-   This is clearly secondary to her bladder mass.  The patient has associated right hydronephrosis. - The patient will be admitted to a medical telemetry bed. - We will follow serial hemoglobins and hematocrits. - We will hold off Eliquis. - We will continue continuous bladder irrigation. - Urology consult to be obtained. - I notified Dr. Erlene Quan about the patient.

## 2022-09-19 NOTE — Evaluation (Signed)
Occupational Therapy Evaluation Patient Details Name: [name] MRN: [id number] DOB: Nov 22, 1955 Today's Date: 09/19/2022   History of Present Illness [name] is a 67 y.o. African-American male with medical history significant for bipolar disorder, type 2 diabetes mellitus without complications, hypertension, TBI, and neurogenic bladder with chronic indwelling Foley cath, who presented to the emergency room with acute onset of hematuria.   Clinical Impression   [name] was seen for OT/PT co-evaluation this date. Per pt reports and chart review, pt lives with spouse who assists with w/c transfers and all ADL mgmt. Pt presents to acute OT demonstrating impaired ADL performance and functional mobility 2/2 functional strength/ROM/balance deficits. Pt currently requires TOTAL A x2 sup<>sit. Fair sitting balance with BUE support. MAX A x2 sit<>Stand from elevated bed height, clears rear only. Pt currently requiring +2 assist, recommend STR to maximize pt safety and return to PLOF. If family able to provide +2 care for transfers then recommend [place].   Recommendations for follow up therapy are one component of a multi-disciplinary discharge planning process, led by the attending physician.  Recommendations may be updated based on patient status, additional functional criteria and insurance authorization.   Follow Up Recommendations  Skilled nursing-short term rehab (<3 hours/day) (HHOT if pt refuses STR)     Assistance Recommended at Discharge Frequent or constant Supervision/Assistance  Patient can return home with the following Two people to help with walking and/or transfers;Two people to help with bathing/dressing/bathroom;Help with stairs or ramp for entrance    Functional Status Assessment  Patient has had a recent decline in their functional status and demonstrates the ability to make significant improvements in function in a reasonable and predictable amount  of time.  Equipment Recommendations  Hospital bed    Recommendations for Other Services       Precautions / Restrictions Precautions Precautions: Fall Restrictions Weight Bearing Restrictions: No      Mobility Bed Mobility Overal bed mobility: Needs Assistance Bed Mobility: Supine to Sit, Sit to Supine     Supine to sit: Total assist, +2 for physical assistance Sit to supine: Total assist, +2 for physical assistance        Transfers Overall transfer level: Needs assistance Equipment used: 2 person hand held assist Transfers: Sit to/from Stand Sit to Stand: Max assist, +2 physical assistance, From elevated surface           General transfer comment: clears rear but does not acheive upright posture      Balance Overall balance assessment: Needs assistance Sitting-balance support: Bilateral upper extremity supported Sitting balance-Leahy Scale: Fair     Standing balance support: Bilateral upper extremity supported, Reliant on assistive device for balance Standing balance-Leahy Scale: Poor                             ADL either performed or assessed with clinical judgement   ADL Overall ADL's : Needs assistance/impaired                                       General ADL Comments: TOTAL A don B socks bed level. SETUP self-feeding bed level. MAX A x2 simulated toileting.      Pertinent Vitals/Pain Pain Assessment Pain Assessment: No/denies pain     Hand Dominance Right   Extremity/Trunk Assessment Upper Extremity Assessment Upper Extremity Assessment: Generalized weakness   Lower Extremity  Assessment Lower Extremity Assessment: Generalized weakness       Communication Communication Communication: No difficulties   Cognition Arousal/Alertness: Awake/alert Behavior During Therapy: WFL for tasks assessed/performed Overall Cognitive Status: History of cognitive impairments - at baseline                                         [place] expects to be discharged to:: Private residence Living Arrangements: Spouse/significant other Available Help at Discharge: Family Type of Home: House Home Access: Ramped entrance     Home Layout: One level     Bathroom Shower/Tub: Occupational psychologist: Standard     Home Equipment: Wheelchair - Education officer, community - power;Hospital bed;Shower Land (2 wheels)   Additional Comments: home setup per chart      Prior Functioning/Environment Prior Level of Function : Needs assist             Mobility Comments: Spouse assists with bed mobility and SPT to/from WC. Reports needing assist for WC mobility. spends most of his day in bed ADLs Comments: Spouse assists with dressing, bathing, and IADLs. sink bath primarily.        OT Problem List: Decreased strength;Decreased activity tolerance;Decreased range of motion;Impaired balance (sitting and/or standing);Decreased safety awareness      OT Treatment/Interventions: Self-care/ADL training;Therapeutic exercise;Energy conservation;DME and/or AE instruction;Therapeutic activities;Patient/family education;Balance training    OT Goals(Current goals can be found in the care plan section) Acute Rehab OT Goals Patient Stated Goal: to go home OT Goal Formulation: With patient Time For Goal Achievement: 10/03/22 Potential to Achieve Goals: Good ADL Goals Pt Will Perform Grooming: with min assist;sitting Pt Will Perform Lower Body Dressing: with max assist;sit to/from stand Pt Will Transfer to Toilet: with max assist;stand pivot transfer;bedside commode  OT Frequency: Min 2X/week    Co-evaluation PT/OT/SLP Co-Evaluation/Treatment: Yes Reason for Co-Treatment: For patient/therapist safety;To address functional/ADL transfers PT goals addressed during session: Mobility/safety with mobility OT goals addressed during session: ADL's and self-care      AM-PAC OT  "6 Clicks" Daily Activity     Outcome Measure Help from another person eating meals?: None Help from another person taking care of personal grooming?: A Little Help from another person toileting, which includes using toliet, bedpan, or urinal?: A Lot Help from another person bathing (including washing, rinsing, drying)?: A Lot Help from another person to put on and taking off regular upper body clothing?: A Little Help from another person to put on and taking off regular lower body clothing?: A Lot 6 Click Score: 16   End of Session    Activity Tolerance: Patient tolerated treatment well Patient left: in bed;with call bell/phone within reach  OT Visit Diagnosis: Other abnormalities of gait and mobility (R26.89);Muscle weakness (generalized) (M62.81)                Time: [id number] OT Time Calculation (min): 9 min Charges:  OT General Charges $OT Visit: 1 Visit OT Evaluation $OT Eval Low Complexity: 1 Low  Dessie Coma, M.S. OTR/L  09/19/22, 2:20 PM  ascom [phone]

## 2022-09-19 NOTE — ED Notes (Signed)
This nurse, with the help of Beth, RN cleaned and changed colostomy. Patient stoma pink. Patient denies any pain.

## 2022-09-19 NOTE — Assessment & Plan Note (Signed)
-   We will continue Seroquel, Trileptal and Namenda.

## 2022-09-19 NOTE — Consult Note (Signed)
[facility]  CONSULT NOTE    Date: 09/19/2022                  Patient Name:  [name]  MRN: [id number]  DOB: 02-21-1956  Age / Sex: 67 y.o., male         PCP: [name], MD                 Service Requesting Consult: Dr. Dwyane Dee                 Reason for Consult: Acute kidney injury            History of Present Illness: [name] presents to [facility] with acute onset of hematuria. Patient is unable to give much of a history due to dementia and history of traumatic brain injury. History taken from chart. Patient found to have hyponatremia, hyperkalemia, acute kidney injury and right sided hydronephrosis. Nephrology has been consulted.   Foley catheter was exchanged. Started on empiric antibiotics.   Patient's eliquis has been held.   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 Urology has been consulted.    Medications: Outpatient medications: (Not in a hospital admission)   Current medications: Current Facility-Administered Medications  Medication Dose Route Frequency Provider Last Rate Last Admin   acetaminophen (TYLENOL) tablet 650 mg  650 mg Oral Q6H PRN Mansy, Jan A, MD       Or   acetaminophen (TYLENOL) suppository 650 mg  650 mg Rectal Q6H PRN Mansy, Arvella Merles, MD       apixaban [facility]) tablet 5 mg  5 mg Oral BID Mansy, Jan A, MD       cefTRIAXone (ROCEPHIN) 1 g in sodium chloride 0.9 % 100 mL IVPB  1 g Intravenous Q24H Val Riles, MD   Stopped at 09/19/22 1208   cholecalciferol (VITAMIN D3) 25 MCG (1000 UNIT) tablet 1,000 Units  1,000 Units Oral Daily Mansy, Jan A, MD   1,000 Units at 09/19/22 1128   cyanocobalamin (VITAMIN B12) tablet 3,000 mcg  3,000 mcg Oral Daily Mansy, Jan A, MD   3,000 mcg at 09/19/22 1128   furosemide (LASIX) 200 mg in dextrose 5 % 100 mL (2 mg/mL) infusion  4 mg/hr Intravenous Continuous Val Riles, MD       insulin aspart (novoLOG) injection 0-5 Units  0-5 Units Subcutaneous QHS Mansy, Jan  A, MD       insulin aspart (novoLOG) injection 0-9 Units  0-9 Units Subcutaneous TID WC Mansy, Jan A, MD   1 Units at 09/19/22 1141   magnesium hydroxide (MILK OF MAGNESIA) suspension 30 mL  30 mL Oral Daily PRN Mansy, Jan A, MD       memantine [facility]) tablet 10 mg  10 mg Oral BID Mansy, Jan A, MD   10 mg at 09/19/22 1128   mupirocin ointment (BACTROBAN) 2 %   Topical Daily Val Riles, MD       omega-3 acid ethyl esters (LOVAZA) capsule 1 g  1 capsule Oral Daily Mansy, Jan A, MD   1 g at 09/19/22 1128   ondansetron (ZOFRAN) tablet 4 mg  4 mg Oral Q6H PRN Mansy, Jan A, MD       Or   ondansetron [facility]) injection 4 mg  4 mg Intravenous Q6H PRN Mansy, Jan A, MD       Oxcarbazepine (TRILEPTAL) tablet 300 mg  300 mg Oral BID Mansy, Jan A, MD   300 mg at  09/19/22 1128   QUEtiapine (SEROQUEL) tablet 50 mg  50 mg Oral Daily PRN Mansy, Jan A, MD       rosuvastatin (CRESTOR) tablet 20 mg  20 mg Oral Daily Mansy, Jan A, MD   20 mg at 09/19/22 1128   sodium bicarbonate tablet 650 mg  650 mg Oral TID Val Riles, MD   650 mg at 09/19/22 1129   sodium chloride irrigation 0.9 % 3,000 mL  3,000 mL Irrigation Continuous Paulette Blanch, MD   Stopped at 09/19/22 0340   sodium zirconium cyclosilicate (LOKELMA) packet 10 g  10 g Oral TID Val Riles, MD   10 g at 09/19/22 0853   traZODone (DESYREL) tablet 25 mg  25 mg Oral QHS PRN Mansy, Arvella Merles, MD       Current Outpatient Medications  Medication Sig Dispense Refill   amLODipine-olmesartan (AZOR) 5-20 MG tablet Take 1 tablet by mouth daily.     BACTRIM DS 800-160 MG tablet Take 1 tablet by mouth 2 (two) times daily.     cholecalciferol (VITAMIN D) 25 MCG tablet Take 1 tablet (1,000 Units total) by mouth daily. 30 tablet 0   ELIQUIS 5 MG TABS tablet Take 5 mg by mouth 2 (two) times daily.      fish oil-omega-3 fatty acids 1000 MG capsule SMARTSIG:1 Capsule(s) By Mouth Daily     glipiZIDE (GLUCOTROL XL) 2.5 MG 24 hr tablet Take 1 tablet (2.5 mg total) by  mouth daily with breakfast. 30 tablet 0   memantine (NAMENDA) 10 MG tablet Take 10 mg by mouth 2 (two) times daily.     Oxcarbazepine (TRILEPTAL) 300 MG tablet Take 1 tablet (300 mg total) by mouth 2 (two) times daily. 180 tablet 1   QUEtiapine (SEROQUEL) 50 MG tablet TAKE 1/2 TO 1 (ONE-HALF TO ONE) TABLET BY MOUTH ONCE DAILY PRN  AGITATION 90 tablet 0   rosuvastatin (CRESTOR) 20 MG tablet Take 20 mg by mouth daily.      vitamin B-12 (CYANOCOBALAMIN) 1000 MCG tablet Take 3,000 mcg by mouth daily.     Continuous Blood Gluc Receiver ([place]) DEVI UAD for blood sugar monitoring.        Allergies: No Known Allergies    Past Medical History: Past Medical History:  Diagnosis Date   Bipolar disorder ([place])    Diabetes mellitus without complication ([place])    History of blood clots    Hypertension    TBI (traumatic brain injury) ([place])      Past Surgical History: Past Surgical History:  Procedure Laterality Date   BACK SURGERY     CARPAL TUNNEL RELEASE Bilateral    COLON SURGERY     COLOSTOMY     IR PERC TUN PERIT CATH WO PORT S&I /IMAG  10/11/2021   IR REMOVAL TUN CV CATH W/O FL  11/19/2021   IR US GUIDE VASC ACCESS RIGHT  10/11/2021   TONSILLECTOMY       Family History: Family History  Problem Relation Age of Onset   Depression Father    Deep vein thrombosis Father      Social History: Social History   Socioeconomic History   Marital status: Married    Spouse name: thelma   Number of children: 0   Years of education: Not on file   Highest education level: Associate degree: occupational, Hotel manager, or vocational program  Occupational History   Not on file  Tobacco Use   Smoking status: Never    Passive  exposure: Never   Smokeless tobacco: Never  Vaping Use   Vaping Use: Never used  Substance and Sexual Activity   Alcohol use: Not Currently   Drug use: No   Sexual activity: Not on file  Other Topics Concern   Not on file  Social History Narrative    Not on file   Social Determinants of Health   Financial Resource Strain: Low Risk  (09/09/2017)   Overall Financial Resource Strain (CARDIA)    Difficulty of Paying Living Expenses: Not hard at all  Food Insecurity: No Food Insecurity (09/19/2022)   Hunger Vital Sign    Worried About Running Out of Food in the Last Year: Never true    Ran Out of Food in the Last Year: Never true  Transportation Needs: No Transportation Needs (09/19/2022)   PRAPARE - Hydrologist (Medical): No    Lack of Transportation (Non-Medical): No  Physical Activity: Inactive (09/09/2017)   Exercise Vital Sign    Days of Exercise per Week: 0 days    Minutes of Exercise per Session: 0 min  Stress: No Stress Concern Present (09/09/2017)   [place]    Feeling of Stress : Not at all  Social Connections: Unknown (09/09/2017)   Social Connection and Isolation Panel [NHANES]    Frequency of Communication with Friends and Family: Not on file    Frequency of Social Gatherings with Friends and Family: Not on file    Attends Religious Services: More than 4 times per year    Active Member of [employer] or Organizations: No    Attends Archivist Meetings: Never    Marital Status: Married  Human resources officer Violence: Not At Risk (09/19/2022)   Humiliation, Afraid, Rape, and Kick questionnaire    Fear of Current or Ex-Partner: No    Emotionally Abused: No    Physically Abused: No    Sexually Abused: No       Vital Signs: Blood pressure 118/60, pulse 62, temperature 98 F (36.7 C), temperature source Oral, resp. rate 14, weight 128.7 kg, SpO2 99 %.  Weight trends: Filed Weights   09/18/22 2309  Weight: 128.7 kg    Physical Exam: General: NAD,   Head: Normocephalic, atraumatic. Moist oral mucosal membranes  Eyes: Anicteric, PERRL  Neck: Supple, trachea midline  Lungs:  Bilateral crackles  Heart: Regular rate and  rhythm  Abdomen:  Soft, nontender, obese  Extremities:  1+ peripheral edema.  Neurologic: Alert to self and place only  Skin: No lesions  GU: Foley with red urine     Lab results: Basic Metabolic Panel: Recent Labs  Lab 09/18/22 2347 09/19/22 0039 09/19/22 0527  NA 125* 129* 127*  K 7.1* 5.7* 5.5*  CL 104 103 107  CO2 16* 18* 18*  GLUCOSE 155* 145* 140*  BUN 45* 44* 45*  CREATININE 2.94* 2.69* 2.68*  CALCIUM 8.0* 7.9* 7.9*    Liver Function Tests: No results for input(s): "AST", "ALT", "ALKPHOS", "BILITOT", "PROT", "ALBUMIN" in the last 168 hours. No results for input(s): "LIPASE", "AMYLASE" in the last 168 hours. No results for input(s): "AMMONIA" in the last 168 hours.  CBC: Recent Labs  Lab 09/18/22 2347 09/19/22 0527 09/19/22 0813  WBC 9.5 10.1  --   NEUTROABS 6.9  --   --   HGB 8.8* 8.2* 8.0*  HCT 28.5* 26.4* 26.2*  MCV 83.6 83.5  --   PLT 214 193  --  Cardiac Enzymes: No results for input(s): "CKTOTAL", "CKMB", "CKMBINDEX", "TROPONINI" in the last 168 hours.  BNP: Invalid input(s): "POCBNP"  CBG: Recent Labs  Lab 09/19/22 0804 09/19/22 1136  GLUCAP 104* 128*    Microbiology: Results for orders placed or performed during the hospital encounter of 03/20/22  SARS Coronavirus 2 by RT PCR (hospital order, performed in [facility] hospital lab) *cepheid single result test* Anterior Nasal Swab     Status: None   Collection Time: 03/20/22  5:50 PM   Specimen: Anterior Nasal Swab  Result Value Ref Range Status   SARS Coronavirus 2 by RT PCR NEGATIVE NEGATIVE Final    Comment: (NOTE) SARS-CoV-2 target nucleic acids are NOT DETECTED.  The SARS-CoV-2 RNA is generally detectable in upper and lower respiratory specimens during the acute phase of infection. The lowest concentration of SARS-CoV-2 viral copies this assay can detect is 250 copies / mL. A negative result does not preclude SARS-CoV-2 infection and should not be used as the sole basis for  treatment or other patient management decisions.  A negative result may occur with improper specimen collection / handling, submission of specimen other than nasopharyngeal swab, presence of viral mutation(s) within the areas targeted by this assay, and inadequate number of viral copies (<250 copies / mL). A negative result must be combined with clinical observations, patient history, and epidemiological information.  Fact Sheet for Patients:   https://www.patel.info/  Fact Sheet for Healthcare Providers: https://hall.com/  This test is not yet approved or  cleared by the [place] FDA and has been authorized for detection and/or diagnosis of SARS-CoV-2 by FDA under an Emergency Use Authorization (EUA).  This EUA will remain in effect (meaning this test can be used) for the duration of the COVID-19 declaration under Section 564(b)(1) of the Act, 21 U.S.C. section 360bbb-3(b)(1), unless the authorization is terminated or revoked sooner.  Performed at [facility], [address] [place], [place] [address]   Culture, blood (Routine x 2)     Status: None   Collection Time: 03/20/22  5:57 PM   Specimen: BLOOD LEFT ARM  Result Value Ref Range Status   Specimen Description BLOOD LEFT ARM  Final   Special Requests   Final    BOTTLES DRAWN AEROBIC AND ANAEROBIC Blood Culture adequate volume   Culture   Final    NO GROWTH 5 DAYS Performed at [facility], [address] [place], [place] [address]    Report Status 03/25/2022 FINAL  Final  Culture, blood (Routine x 2)     Status: Abnormal   Collection Time: 03/20/22  7:09 PM   Specimen: BLOOD  Result Value Ref Range Status   Specimen Description   Final    BLOOD RIGHT ANTECUBITAL Performed at [facility], [address], [place], [place] [address]    Special Requests   Final    BOTTLES DRAWN AEROBIC AND ANAEROBIC Blood Culture results may not be optimal  due to an excessive volume of blood received in culture bottles Performed at [facility], [address], [place], [place] [address]    Culture  Setup Time   Final    GRAM POSITIVE RODS ANAEROBIC BOTTLE ONLY CRITICAL RESULT CALLED TO, READ BACK BY AND VERIFIED WITHLu Duffel PHARMD Bud 03/25/22 HNM Performed at [facility], [address] [place], [place] [address]    Culture (A)  Final    PROPIONIBACTERIUM ACNES Standardized susceptibility testing for this organism is not available. Performed  at [facility], [place] [address], [place], [place] [address]    Report Status 03/27/2022 FINAL  Final  Remove and replace urinary cath (placed > 5 days) then obtain urine culture from new indwelling urinary catheter.     Status: Abnormal   Collection Time: 03/21/22  8:34 AM   Specimen: Urine, Catheterized  Result Value Ref Range Status   Specimen Description   Final    URINE, CATHETERIZED Performed at [facility], [address] [place], [place] [address]    Special Requests   Final    NONE Performed at [facility], [place], [place] [address]    Culture (A)  Final    20,000 COLONIES/mL ACINETOBACTER CALCOACETICUS/BAUMANNII COMPLEX   Report Status 03/23/2022 FINAL  Final   Organism ID, Bacteria ACINETOBACTER CALCOACETICUS/BAUMANNII COMPLEX (A)  Final      Susceptibility   Acinetobacter calcoaceticus/baumannii complex - MIC*    CEFTAZIDIME 4 SENSITIVE Sensitive     CIPROFLOXACIN <=0.25 SENSITIVE Sensitive     GENTAMICIN <=1 SENSITIVE Sensitive     IMIPENEM <=0.25 SENSITIVE Sensitive     PIP/TAZO <=4 SENSITIVE Sensitive     TRIMETH/SULFA <=20 SENSITIVE Sensitive     AMPICILLIN/SULBACTAM <=2 SENSITIVE Sensitive     * 20,000 COLONIES/mL ACINETOBACTER CALCOACETICUS/BAUMANNII COMPLEX    Coagulation Studies: No results for input(s): "LABPROT", "INR" in the last 72 hours.  Urinalysis: Recent Labs    09/19/22 0301   COLORURINE YELLOW*  LABSPEC 1.015  PHURINE 6.0  GLUCOSEU NEGATIVE  HGBUR LARGE*  BILIRUBINUR NEGATIVE  KETONESUR NEGATIVE  PROTEINUR 100*  NITRITE NEGATIVE  LEUKOCYTESUR TRACE*      Imaging: CT Renal Stone Study  Result Date: 09/19/2022 CLINICAL DATA:  Bladder neck obstruction. EXAM: CT ABDOMEN AND PELVIS WITHOUT CONTRAST TECHNIQUE: Multidetector CT imaging of the abdomen and pelvis was performed following the standard protocol without IV contrast. RADIATION DOSE REDUCTION: This exam was performed according to the departmental dose-optimization program which includes automated exposure control, adjustment of the mA and/or kV according to patient size and/or use of iterative reconstruction technique. COMPARISON:  CT abdomen pelvis dated 01/12/2022. FINDINGS: Evaluation of this exam is limited in the absence of intravenous contrast. Lower chest: The visualized lung bases are clear. There is coronary vascular calcification. No intra-abdominal free air or free fluid. Hepatobiliary: Subcentimeter hypodense lesion in the right lobe of the liver is too small to characterize but was present on the prior CT, likely a cyst. The liver is otherwise unremarkable. No biliary dilatation. The gallbladder is unremarkable. Pancreas: Unremarkable. No pancreatic ductal dilatation or surrounding inflammatory changes. Spleen: Normal in size without focal abnormality. Adrenals/Urinary Tract: The adrenal glands unremarkable. Mild bilateral renal parenchyma atrophy. There is moderate right hydronephrosis, increased since the prior CT. There is mild right hydroureter. No stone identified. There is no hydronephrosis or nephrolithiasis on the left. There is a 3.8 x 2.3 cm mass in the base of the bladder which may represent median hypertrophy of the prostate gland but concerning for a bladder urothelial neoplasm. Further evaluation with cystoscopy is recommended. Small pocket of air within the bladder likely introduced via  recent catheterization. Stomach/Bowel: Postsurgical changes of the bowel with a left anterior loop colostomy. There is no bowel obstruction or active inflammation. The appendix is normal. Vascular/Lymphatic: Moderate aortoiliac atherosclerotic disease. The IVC is unremarkable. No portal venous gas. There is no adenopathy. Reproductive: The prostate gland is grossly unremarkable. A Foley catheter with compressed balloon in the bulbar urethra.  Recommend retraction and repositioning. Other: None Musculoskeletal: There is deep sacral ulcer extending from the skin to the coccyx. There is slight erosion of the tip of the coccyx as seen on the prior CT, likely chronic. No drainable fluid collection or abscess. Degenerative changes of the spine. No acute osseous pathology. IMPRESSION: 1. Moderate right hydronephrosis, increased since the prior CT. No stone identified. 2. A 3.8 x 2.3 cm mass in the base of the bladder may represent protruding median hypertrophy of the prostate gland but concerning for a bladder urothelial neoplasm. Further evaluation with cystoscopy is recommended. 3. A Foley catheter with compressed balloon in the bulbar urethra. Recommend retraction and repositioning. 4. Deep sacral ulcer extending from the skin to the coccyx. No drainable fluid collection or abscess. 5. Postsurgical changes of the bowel with a left anterior loop colostomy. No bowel obstruction. Normal appendix. 6.  Aortic Atherosclerosis (ICD10-I70.0). Electronically Signed   By: Anner Crete M.D.   On: 09/19/2022 01:25     Assessment & Plan: Mr. Tano Marcum is a 67 y.o.  male with frontotemporal dementia, bipolar disorder, traumatic brain injury, PE/DVT, diabetes mellitus type II, hypertension, neurogenic bladder who was admitted to [facility] on 09/18/2022 for Hematuria [R31.9]  Acute kidney injury on chronic kidney disease stage IIIB: baseline creatinine of 1.72, GFR of 43 on 03/22/22. Secondary to obstructive uropathy and  possibly acute cardiorenal syndrome secondary to acute exacerbation of congestive heart failure.    Hyponatremia: hypervolemic. Secondary to congestive heart failure.   Hyperkalemia: with acute kidney injury and chronic Bactrim use.   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   Hematuria: gross. With possible trauma from foley catheter.   Acute exacerbation of chronic diastolic congestive heart failure  Plan: - holding olmesartan - holding Bactrim - Discontinue saline infusion - Check echocardiogram - Started on furosemide gtt - Given sodium bicarbonate and lokelma.      LOS: 0 Rodney Yera 3/21/20241:11 PM

## 2022-09-19 NOTE — ED Notes (Signed)
Patient was given a meal tray to eat. Foley catheter was also emptied.

## 2022-09-19 NOTE — ED Notes (Signed)
Balloon to catheter was deflated to removed catheter for proper replacement/ pt coughed and catheter came out along with urine and large clots. MD made aware
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
## 2022-09-19 NOTE — Consult Note (Signed)
WOC Nurse Consult Note: Reason for Consult: unstageable sacral pressure injury Right calcaneous pressure injury, appears to have started as a fissure, nonhealing unstageable pressure injury Right anterior leg near dorsal foot stage 2 pressure injury Wound type: pressure injury, nonhealing Pressure Injury POA: Yes Measurement: sacrum/right gluteal area:  3 cmx 2.4 cm  x 3 cm  Right dorsal foot:  0.3 cm x 0.5 cm x o.1 cm  Right calcaneous:  2 cmx 2 cm x 1 cm  Wound bed: Ruddy red with 25% yellow slough Drainage (amount, consistency, odor) minimal serosanguinous  no odor Periwound: scarring from previous breakdown Dressing procedure/placement/frequency: Cleanse wounds to sacrum/buttocks, right heel and right dorsal foot with NS and pat dry. Apply mupirocin ointment to wound bed  Cover with gauze and silicone foam.  Change daily.  Prevalon boots while in bed.  [place] Nurse ostomy consult note Stoma type/location: LLQ colostomy Stomal assessment/size: 1 1/2"  Peristomal assessment: not assessed, intact Treatment options for stomal/peristomal skin: 2 piece pouch Output soft brown stool Ostomy pouching: 2pc. 2 3/4" pouch with barrier ring Education provided: none Enrolled patient in [place] program: NA  Will not follow at this time.  Please re-consult if needed.  Estrellita Ludwig MSN, RN, FNP-BC CWON Wound, Ostomy, Continence Nurse [facility] [phone] Pager [phone]

## 2022-09-19 NOTE — ED Notes (Signed)
Wife reports to nurse that Patient blood sugar drops in the late evenings. Patient usually drinks a protein shake to keep is BS up.

## 2022-09-19 NOTE — Plan of Care (Signed)
Patient was seen and examined at bedside in the ED active issues.  Patient was admitted due to hematuria.  Patient was found to have bladder mass, urology is following.  CKD stage IV, developed hyperkalemia, W acidosis and hyponatremia.  Started IV Lasix infusion, discontinued IV fluid, nephrology consulted for further management.

## 2022-09-19 NOTE — TOC Initial Note (Signed)
Transition of Care [facility]) - Initial/Assessment Note    Patient Details  Name: [name] MRN: [id number] Date of Birth: 03-20-1956  Transition of Care [facility]) CM/SW Contact:    Tiburcio Bash, LCSW Phone Number: 09/19/2022, 10:23 AM  Clinical Narrative:                  Patient admitted from home, lives with wife [name].   Per [name] she reports PCP is Dr. Johnn Hai, receives medication from [place]. Confirms that patient is active with [facility] for home health services. [name] reports patient was getting RN at home which is all he needed for wound care. Patient has a walker and wheel chair at home.   Cheryl notified of patient's admission. No additional needs noted yet for discharge planning.   Expected Discharge Plan: [place] Barriers to Discharge: Continued Medical Work up   Patient Goals and CMS Choice Patient states their goals for this hospitalization and ongoing recovery are:: to go home CMS Medicare.gov Compare Post Acute Care list provided to:: Patient Choice offered to / list presented to : Patient      Expected Discharge Plan and Services       Living arrangements for the past 2 months: Single Family Home                                      Prior Living Arrangements/Services Living arrangements for the past 2 months: [place] Lives with:: Spouse                   Activities of Daily Living Home Assistive Devices/Equipment: Environmental consultant (specify type), Wheelchair, Radio producer (specify quad or straight), Eyeglasses ADL Screening (condition at time of admission) Patient's cognitive ability adequate to safely complete daily activities?: Yes Is the patient deaf or have difficulty hearing?: No Does the patient have difficulty seeing, even when wearing glasses/contacts?: No Does the patient have difficulty concentrating, remembering, or making decisions?: No Patient able to express need for assistance with ADLs?:  Yes Does the patient have difficulty dressing or bathing?: Yes Independently performs ADLs?: No Communication: Independent Dressing (OT): Independent Grooming: Independent Feeding: Independent Bathing: Needs assistance Is this a change from baseline?: Change from baseline, expected to last <3 days Toileting: Dependent Is this a change from baseline?: Change from baseline, expected to last <3 days In/Out Bed: Needs assistance Is this a change from baseline?: Change from baseline, expected to last <3 days Walks in Home: Dependent Is this a change from baseline?: Change from baseline, expected to last >3 days Does the patient have difficulty walking or climbing stairs?: Yes Weakness of Legs: Both Weakness of Arms/Hands: None  Permission Sought/Granted                  Emotional Assessment              Admission diagnosis:  Hematuria [R31.9] Patient Active Problem List   Diagnosis Date Noted   Hematuria 09/19/2022   Hyperkalemia 09/19/2022   Mass of urinary bladder 09/19/2022   Sepsis due to gram-negative UTI ([place]) 03/20/2022   Type 2 diabetes mellitus without complications ([place]) 123456   Dyslipidemia 123456   Complicated UTI (urinary tract infection) 02/22/2022   Acute renal failure superimposed on stage 3b chronic kidney disease ([place]) 02/22/2022   Chronic diastolic CHF (congestive heart failure) ([place]) 02/22/2022   Obesity with body mass index of  30.0-39.9 02/22/2022   Cellulitis of left thigh 02/22/2022   Essential hypertension 01/13/2022   Seizure prophylaxis 01/13/2022   Acute pyelonephritis 01/05/2022   Fever 12/16/2021   Hypotension 10/09/2021   Abdominal distension 10/08/2021   AKI (acute kidney injury) ([place]) 10/08/2021   Sacral decubitus ulcer 10/07/2021   MSSA bacteremia 10/07/2021   Severe sepsis ([place]) 10/06/2021   Sepsis secondary to UTI ([place]) 07/28/2021   COVID-19 virus infection 07/28/2021   Chronic indwelling Foley catheter 06/22/2021    Colostomy in place [facility]) 06/22/2021   TBI (traumatic brain injury) ([place])    Healed ulcer of foot on examination 04/14/2021   Acute UTI 02/23/2021   Osteomyelitis of vertebra, sacral and sacrococcygeal region [facility]) 10/18/2020   Hyponatremia 07/07/2020   Encephalopathy 07/07/2020   Chronic multifocal osteomyelitis of right foot ([place]) 07/07/2020   Anemia of chronic disease 07/07/2020   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[place]) 06/01/2020   Sacral osteomyelitis ([place]) 04/13/2020   Urinary retention 03/19/2020   History of DVT (deep vein thrombosis) 03/19/2020   Pressure injury of sacral region, stage 4 ([place]) 03/19/2020   Severe sepsis with septic shock ([place]) 03/18/2020   Obesity, Class III, BMI 40-49.9 (morbid obesity) ([place]) 03/18/2020   Cellulitis 03/18/2020   Acute cystitis without hematuria    Stage 3b chronic kidney disease (CKD) ([place]) - baseline SCr 1.8-1.9 01/20/2020   Decubitus ulcers 01/17/2020   Sepsis ([place]) 01/16/2020   Cellulitis of sacral region 01/16/2020   Acute kidney injury superimposed on chronic kidney disease ([place]) [id number]   Acute metabolic encephalopathy [id number]   Bipolar disorder, in full remission, most recent episode mixed ([place]) 12/29/2019   High risk medication use 10/25/2019   Noncompliance with treatment plan 10/25/2019   Bipolar I disorder, most recent episode mixed ([place]) 01/07/2019   GAD (generalized anxiety disorder) 01/07/2019   Insomnia due to medical condition 01/07/2019   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 ([place]) 03/06/2016   Bipolar disorder ([place]) 01/03/2016   Lithium toxicity 01/02/2016   Chronic anticoagulation - on Eliquis 07/06/2014   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[place]) 07/06/2014   Hyperlipidemia 07/06/2014   Leg swelling 07/06/2014   Obesity, Class II, BMI 35-39.9, with comorbidity 07/06/2014   On medication for venous thromboembolism  07/06/2014   Sleep apnea 07/06/2014   Type II diabetes mellitus with renal manifestations ([place]) 07/06/2014   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   Venous thromboembolism (VTE) 07/06/2014   PCP:  [name], MD Pharmacy:   [facility] [address], [place] [address] [place] [address] Phone: [phone] Fax: [phone]     Social Determinants of Health (SDOH) Social History: SDOH Screenings   Food Insecurity: No Food Insecurity (09/19/2022)  Housing: Low Risk  (09/19/2022)  Transportation Needs: No Transportation Needs (09/19/2022)  Utilities: Not At Risk (09/19/2022)  Depression (PHQ2-9): Low Risk  (06/04/2022)  Financial Resource Strain: Low Risk  (09/09/2017)  Physical Activity: Inactive (09/09/2017)  Social Connections: Unknown (09/09/2017)  Stress: No Stress Concern Present (09/09/2017)  Tobacco Use: Low Risk  (09/19/2022)   SDOH Interventions:     Readmission Risk Interventions    03/23/2022   12:09 PM 02/23/2022    3:44 PM 01/14/2022    3:23 PM  Readmission Risk Prevention Plan  Transportation Screening Complete Complete Complete  Medication Review (RN Care Manager) Complete  Complete  PCP or Specialist appointment within 3-5 days of discharge Complete Complete   HRI or  Home Care Consult Complete Complete Complete  SW Recovery Care/Counseling Consult Complete Complete   Palliative Care Screening Not Applicable Not Applicable Not Applicable  Skilled Nursing Facility Not Applicable Not Applicable

## 2022-09-19 NOTE — ED Notes (Signed)
CRITICAL RESULT: 7.1 MD aware

## 2022-09-19 NOTE — Assessment & Plan Note (Signed)
-   This is likely hypovolemic. - The patient will be hydrated with IV normal saline and will follow BMP. 

## 2022-09-20 ENCOUNTER — Inpatient Hospital Stay: Payer: Medicare Other

## 2022-09-20 DIAGNOSIS — Z7189 Other specified counseling: Secondary | ICD-10-CM | POA: Diagnosis not present

## 2022-09-20 DIAGNOSIS — E872 Acidosis, unspecified: Secondary | ICD-10-CM | POA: Insufficient documentation

## 2022-09-20 DIAGNOSIS — R310 Gross hematuria: Secondary | ICD-10-CM

## 2022-09-20 DIAGNOSIS — N189 Chronic kidney disease, unspecified: Secondary | ICD-10-CM | POA: Diagnosis not present

## 2022-09-20 DIAGNOSIS — N179 Acute kidney failure, unspecified: Secondary | ICD-10-CM | POA: Diagnosis not present

## 2022-09-20 DIAGNOSIS — N3289 Other specified disorders of bladder: Secondary | ICD-10-CM | POA: Diagnosis not present

## 2022-09-20 DIAGNOSIS — N1330 Unspecified hydronephrosis: Secondary | ICD-10-CM | POA: Insufficient documentation

## 2022-09-20 LAB — BASIC METABOLIC PANEL
Anion gap: 12 (ref 5–15)
BUN: 43 mg/dL — ABNORMAL HIGH (ref 8–23)
CO2: 19 mmol/L — ABNORMAL LOW (ref 22–32)
Calcium: 8.9 mg/dL (ref 8.9–10.3)
Chloride: 104 mmol/L (ref 98–111)
Creatinine, Ser: 2.79 mg/dL — ABNORMAL HIGH (ref 0.61–1.24)
GFR, Estimated: 24 mL/min — ABNORMAL LOW (ref >60)
Glucose, Bld: 130 mg/dL — ABNORMAL HIGH (ref 70–99)
Potassium: 5.3 mmol/L — ABNORMAL HIGH (ref 3.5–5.1)
Sodium: 135 mmol/L (ref 135–145)

## 2022-09-20 LAB — CBC
HCT: 26.4 % — ABNORMAL LOW (ref 39.0–52.0)
Hemoglobin: 8.1 g/dL — ABNORMAL LOW (ref 13.0–17.0)
MCH: 25.7 pg — ABNORMAL LOW (ref 26.0–34.0)
MCHC: 30.7 g/dL (ref 30.0–36.0)
MCV: 83.8 fL (ref 80.0–100.0)
Platelets: 201 10*3/uL (ref 150–400)
RBC: 3.15 MIL/uL — ABNORMAL LOW (ref 4.22–5.81)
RDW: 18 % — ABNORMAL HIGH (ref 11.5–15.5)
WBC: 6.5 10*3/uL (ref 4.0–10.5)
nRBC: 0 % (ref 0.0–0.2)

## 2022-09-20 LAB — URINE CULTURE: Culture: NO GROWTH

## 2022-09-20 LAB — GLUCOSE, CAPILLARY
Glucose-Capillary: 132 mg/dL — ABNORMAL HIGH (ref 70–99)
Glucose-Capillary: 153 mg/dL — ABNORMAL HIGH (ref 70–99)

## 2022-09-20 LAB — CBG MONITORING, ED
Glucose-Capillary: 108 mg/dL — ABNORMAL HIGH (ref 70–99)
Glucose-Capillary: 100 mg/dL — ABNORMAL HIGH (ref 70–99)

## 2022-09-20 LAB — HEPATITIS B SURFACE ANTIGEN: Hepatitis B Surface Ag: NONREACTIVE

## 2022-09-20 LAB — IRON AND TIBC
Iron: 42 ug/dL — ABNORMAL LOW (ref 45–182)
Saturation Ratios: 19 % (ref 17.9–39.5)
TIBC: 258 ug/dL (ref 250–450)
UIBC: 206 ug/dL

## 2022-09-20 LAB — MAGNESIUM: Magnesium: 1.9 mg/dL (ref 1.7–2.4)

## 2022-09-20 LAB — PHOSPHORUS: Phosphorus: 3.7 mg/dL (ref 2.5–4.6)

## 2022-09-20 LAB — FERRITIN: Ferritin: 34 ng/mL (ref 24–336)

## 2022-09-20 LAB — HEPATITIS B CORE ANTIBODY, IGM: Hep B C IgM: NONREACTIVE

## 2022-09-20 LAB — HEPATITIS B CORE ANTIBODY, TOTAL: Hep B Core Total Ab: NONREACTIVE

## 2022-09-20 MED ORDER — CHLORHEXIDINE GLUCONATE CLOTH 2 % EX PADS
6.0000 | MEDICATED_PAD | Freq: Every day | CUTANEOUS | Status: DC
  Administered 2022-09-20 – 2022-09-25 (×7): 6 via TOPICAL

## 2022-09-20 NOTE — Progress Notes (Signed)
Urology Inpatient Progress Note  Subjective: No acute events overnight.  He is afebrile, VSS. Hemoglobin stable, 8.1.  Creatinine up, 2.79. Foley catheter in place draining clear, yellow urine. Patient denies pain.  He has not had anything to eat or drink this morning.  NPO order placed at 0842.  Last Eliquis administered yesterday at 2226.  Anti-infectives: Anti-infectives (From admission, onward)    Start     Dose/Rate Route Frequency Ordered Stop   09/19/22 1100  cefTRIAXone (ROCEPHIN) 1 g in sodium chloride 0.9 % 100 mL IVPB        1 g 200 mL/hr over 30 Minutes Intravenous Every 24 hours 09/19/22 1058         Current Facility-Administered Medications  Medication Dose Route Frequency Provider Last Rate Last Admin   acetaminophen (TYLENOL) tablet 650 mg  650 mg Oral Q6H PRN Mansy, Jan A, MD       Or   acetaminophen (TYLENOL) suppository 650 mg  650 mg Rectal Q6H PRN Mansy, Jan A, MD       apixaban Arne [place]) tablet 5 mg  5 mg Oral BID Mansy, Jan A, MD   5 mg at 09/19/22 2226   cefTRIAXone (ROCEPHIN) 1 g in sodium chloride 0.9 % 100 mL IVPB  1 g Intravenous Q24H Val Riles, MD   Stopped at 09/19/22 1208   cholecalciferol (VITAMIN D3) 25 MCG (1000 UNIT) tablet 1,000 Units  1,000 Units Oral Daily Mansy, Jan A, MD   1,000 Units at 09/19/22 1128   cyanocobalamin (VITAMIN B12) tablet 3,000 mcg  3,000 mcg Oral Daily Mansy, Jan A, MD   3,000 mcg at 09/19/22 1128   furosemide (LASIX) 200 mg in dextrose 5 % 100 mL (2 mg/mL) infusion  4 mg/hr Intravenous Continuous Val Riles, MD 2 mL/hr at 09/19/22 1431 4 mg/hr at 09/19/22 1431   insulin aspart (novoLOG) injection 0-5 Units  0-5 Units Subcutaneous QHS Mansy, Jan A, MD       insulin aspart (novoLOG) injection 0-9 Units  0-9 Units Subcutaneous TID WC Mansy, Jan A, MD   1 Units at 09/19/22 1141   magnesium hydroxide (MILK OF MAGNESIA) suspension 30 mL  30 mL Oral Daily PRN Mansy, Jan A, MD       memantine [facility]) tablet 10 mg  10 mg Oral  BID Mansy, Jan A, MD   10 mg at 09/19/22 2226   mupirocin ointment (BACTROBAN) 2 %   Topical Daily Val Riles, MD       omega-3 acid ethyl esters (LOVAZA) capsule 1 g  1 capsule Oral Daily Mansy, Jan A, MD   1 g at 09/19/22 1128   ondansetron (ZOFRAN) tablet 4 mg  4 mg Oral Q6H PRN Mansy, Jan A, MD       Or   ondansetron [facility]) injection 4 mg  4 mg Intravenous Q6H PRN Mansy, Jan A, MD       Oxcarbazepine (TRILEPTAL) tablet 300 mg  300 mg Oral BID Mansy, Jan A, MD   300 mg at 09/19/22 2226   QUEtiapine (SEROQUEL) tablet 50 mg  50 mg Oral Daily PRN Mansy, Jan A, MD       rosuvastatin (CRESTOR) tablet 20 mg  20 mg Oral Daily Mansy, Jan A, MD   20 mg at 09/19/22 1128   sodium bicarbonate tablet 650 mg  650 mg Oral TID Val Riles, MD   650 mg at 09/19/22 2226   sodium chloride irrigation 0.9 % 3,000 mL  3,000 mL  Irrigation Continuous Paulette Blanch, MD   Stopped at 09/19/22 0340   traZODone (DESYREL) tablet 25 mg  25 mg Oral QHS PRN Mansy, Arvella Merles, MD       Current Outpatient Medications  Medication Sig Dispense Refill   amLODipine-olmesartan (AZOR) 5-20 MG tablet Take 1 tablet by mouth daily.     BACTRIM DS 800-160 MG tablet Take 1 tablet by mouth 2 (two) times daily.     cholecalciferol (VITAMIN D) 25 MCG tablet Take 1 tablet (1,000 Units total) by mouth daily. 30 tablet 0   ELIQUIS 5 MG TABS tablet Take 5 mg by mouth 2 (two) times daily.      fish oil-omega-3 fatty acids 1000 MG capsule SMARTSIG:1 Capsule(s) By Mouth Daily     glipiZIDE (GLUCOTROL XL) 2.5 MG 24 hr tablet Take 1 tablet (2.5 mg total) by mouth daily with breakfast. 30 tablet 0   memantine (NAMENDA) 10 MG tablet Take 10 mg by mouth 2 (two) times daily.     Oxcarbazepine (TRILEPTAL) 300 MG tablet Take 1 tablet (300 mg total) by mouth 2 (two) times daily. 180 tablet 1   QUEtiapine (SEROQUEL) 50 MG tablet TAKE 1/2 TO 1 (ONE-HALF TO ONE) TABLET BY MOUTH ONCE DAILY PRN  AGITATION 90 tablet 0   rosuvastatin (CRESTOR) 20 MG tablet  Take 20 mg by mouth daily.      vitamin B-12 (CYANOCOBALAMIN) 1000 MCG tablet Take 3,000 mcg by mouth daily.     Continuous Blood Gluc Receiver ([place]) DEVI UAD for blood sugar monitoring.     Objective: Vital signs in last 24 hours: Temp:  [98.3 F (36.8 C)] 98.3 F (36.8 C) (03/22 0000) Pulse Rate:  [53-72] 53 (03/22 0700) Resp:  [10-17] 10 (03/22 0700) BP: (95-141)/(55-88) 95/55 (03/22 0700) SpO2:  [97 %-100 %] 99 % (03/22 0700)  Intake/Output from previous day: 03/21 0701 - 03/22 0700 In: -  Out: 3700 [Urine:3700] Intake/Output this shift: No intake/output data recorded.  Physical Exam Vitals and nursing note reviewed.  Constitutional:      General: He is not in acute distress.    Appearance: He is not ill-appearing, toxic-appearing or diaphoretic.  HENT:     Head: Normocephalic and atraumatic.  Pulmonary:     Effort: Pulmonary effort is normal. No respiratory distress.  Skin:    General: Skin is warm and dry.  Neurological:     Mental Status: He is alert. Mental status is at baseline.  Psychiatric:        Mood and Affect: Mood normal.        Behavior: Behavior normal.     Lab Results:  Recent Labs    09/19/22 0527 09/19/22 0813 09/19/22 1918 09/20/22 0438  WBC 10.1  --   --  6.5  HGB 8.2*   < > 8.2* 8.1*  HCT 26.4*   < > 26.8* 26.4*  PLT 193  --   --  201   < > = values in this interval not displayed.   BMET Recent Labs    09/19/22 0527 09/20/22 0438  NA 127* 135  K 5.5* 5.3*  CL 107 104  CO2 18* 19*  GLUCOSE 140* 130*  BUN 45* 43*  CREATININE 2.68* 2.79*  CALCIUM 7.9* 8.9   Studies/Results: CT Renal Stone Study  Result Date: 09/19/2022 CLINICAL DATA:  Bladder neck obstruction. EXAM: CT ABDOMEN AND PELVIS WITHOUT CONTRAST TECHNIQUE: Multidetector CT imaging of the abdomen and pelvis was performed following the standard  protocol without IV contrast. RADIATION DOSE REDUCTION: This exam was performed according to the departmental  dose-optimization program which includes automated exposure control, adjustment of the mA and/or kV according to patient size and/or use of iterative reconstruction technique. COMPARISON:  CT abdomen pelvis dated 01/12/2022. FINDINGS: Evaluation of this exam is limited in the absence of intravenous contrast. Lower chest: The visualized lung bases are clear. There is coronary vascular calcification. No intra-abdominal free air or free fluid. Hepatobiliary: Subcentimeter hypodense lesion in the right lobe of the liver is too small to characterize but was present on the prior CT, likely a cyst. The liver is otherwise unremarkable. No biliary dilatation. The gallbladder is unremarkable. Pancreas: Unremarkable. No pancreatic ductal dilatation or surrounding inflammatory changes. Spleen: Normal in size without focal abnormality. Adrenals/Urinary Tract: The adrenal glands unremarkable. Mild bilateral renal parenchyma atrophy. There is moderate right hydronephrosis, increased since the prior CT. There is mild right hydroureter. No stone identified. There is no hydronephrosis or nephrolithiasis on the left. There is a 3.8 x 2.3 cm mass in the base of the bladder which may represent median hypertrophy of the prostate gland but concerning for a bladder urothelial neoplasm. Further evaluation with cystoscopy is recommended. Small pocket of air within the bladder likely introduced via recent catheterization. Stomach/Bowel: Postsurgical changes of the bowel with a left anterior loop colostomy. There is no bowel obstruction or active inflammation. The appendix is normal. Vascular/Lymphatic: Moderate aortoiliac atherosclerotic disease. The IVC is unremarkable. No portal venous gas. There is no adenopathy. Reproductive: The prostate gland is grossly unremarkable. A Foley catheter with compressed balloon in the bulbar urethra. Recommend retraction and repositioning. Other: None Musculoskeletal: There is deep sacral ulcer extending  from the skin to the coccyx. There is slight erosion of the tip of the coccyx as seen on the prior CT, likely chronic. No drainable fluid collection or abscess. Degenerative changes of the spine. No acute osseous pathology. IMPRESSION: 1. Moderate right hydronephrosis, increased since the prior CT. No stone identified. 2. A 3.8 x 2.3 cm mass in the base of the bladder may represent protruding median hypertrophy of the prostate gland but concerning for a bladder urothelial neoplasm. Further evaluation with cystoscopy is recommended. 3. A Foley catheter with compressed balloon in the bulbar urethra. Recommend retraction and repositioning. 4. Deep sacral ulcer extending from the skin to the coccyx. No drainable fluid collection or abscess. 5. Postsurgical changes of the bowel with a left anterior loop colostomy. No bowel obstruction. Normal appendix. 6.  Aortic Atherosclerosis (ICD10-I70.0). Electronically Signed   By: Anner Crete M.D.   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  Gross hematuria has resolved after Foley catheter replacement yesterday.  Unfortunately, his creatinine continues to rise.  I am concerned for obstructive component with bladder base mass which may be obstructing the right UO.  I placed orders to make him n.p.o. and have asked his primary team to hold Eliquis while we obtain a stat renal ultrasound this morning.  If hydronephrosis is not improved, will recommend involving IR for placement of a right nephrostomy tube.  Anticipate right ureteral stent placement would be difficult in the setting of presumed bladder  mass.  Will continue to  follow renal ultrasound results this morning and update chart accordingly.  Debroah Loop, PA-C 09/20/2022

## 2022-09-20 NOTE — Hospital Course (Signed)
[name] is a 67 y.o. African-American male with medical history significant for bipolar disorder, type 2 diabetes mellitus without complications, hypertension, TBI, and neurogenic bladder with chronic indwelling Foley cath, who presented to the emergency room with acute onset of hematuria.  Labs revealed hyponatremia of 129 and potassium of 7.1, CO2 16, creatinine 2.69. UA showed 11-20 WBCs with more than 50 RBCs and trace leukocytes. CT scan showed moderate right hydronephrosis, increased since the prior CT. A 3.8 x 2.3 cm mass in the base of the bladder.  Consult from urology and nephrology was obtained.  Potassium normalized after giving fluids.  Patient is pending nephrostomy tube placement. Patient is also diagnosed with acute on chronic diastolic congestive heart failure, nephrology has started Lasix drip.

## 2022-09-20 NOTE — Progress Notes (Signed)
[facility]  ROUNDING NOTE   Subjective:   Patient seen lying in bed Alert Remains on room air Generalized edema improving  Foley catheter with clear yellow urine   Objective:  Vital signs in last 24 hours:  Temp:  [98.3 F (36.8 C)-98.5 F (36.9 C)] 98.5 F (36.9 C) (03/22 1028) Pulse Rate:  [53-72] 56 (03/22 1100) Resp:  [10-17] 14 (03/22 1100) BP: (95-141)/(55-88) 122/67 (03/22 1100) SpO2:  [97 %-100 %] 100 % (03/22 1100)  Weight change:  Filed Weights   09/18/22 2309  Weight: 128.7 kg    Intake/Output: I/O last 3 completed shifts: In: 1000 [IV Piggyback:1000] Out: 4250 [Urine:4250]   Intake/Output this shift:  Total I/O In: -  Out: 1100 [Urine:1100]  Physical Exam: General: NAD  Head: Normocephalic, atraumatic. Moist oral mucosal membranes  Eyes: Anicteric  Lungs:  Diminished bases  Heart: Regular rate and rhythm  Abdomen:  Soft, nontender, distended  Extremities:  1+ peripheral edema.  Neurologic: Alert and oriented, moving all four extremities  Skin: No lesions  Access: None    Basic Metabolic Panel: Recent Labs  Lab 09/18/22 2347 09/19/22 0039 09/19/22 0527 09/20/22 0438  NA 125* 129* 127* 135  K 7.1* 5.7* 5.5* 5.3*  CL 104 103 107 104  CO2 16* 18* 18* 19*  GLUCOSE 155* 145* 140* 130*  BUN 45* 44* 45* 43*  CREATININE 2.94* 2.69* 2.68* 2.79*  CALCIUM 8.0* 7.9* 7.9* 8.9  MG  --   --   --  1.9  PHOS  --   --   --  3.7    Liver Function Tests: No results for input(s): "AST", "ALT", "ALKPHOS", "BILITOT", "PROT", "ALBUMIN" in the last 168 hours. No results for input(s): "LIPASE", "AMYLASE" in the last 168 hours. No results for input(s): "AMMONIA" in the last 168 hours.  CBC: Recent Labs  Lab 09/18/22 2347 09/19/22 0527 09/19/22 0813 09/19/22 1918 09/20/22 0438  WBC 9.5 10.1  --   --  6.5  NEUTROABS 6.9  --   --   --   --   HGB 8.8* 8.2* 8.0* 8.2* 8.1*  HCT 28.5* 26.4* 26.2* 26.8* 26.4*  MCV 83.6 83.5  --   --   83.8  PLT 214 193  --   --  201    Cardiac Enzymes: No results for input(s): "CKTOTAL", "CKMB", "CKMBINDEX", "TROPONINI" in the last 168 hours.  BNP: Invalid input(s): "POCBNP"  CBG: Recent Labs  Lab 09/19/22 1136 09/19/22 1709 09/19/22 2207 09/20/22 0723 09/20/22 1114  GLUCAP 128* 101* 126* 108* 100*    Microbiology: Results for orders placed or performed during the hospital encounter of 09/18/22  Urine Culture     Status: None   Collection Time: 09/19/22  3:01 AM   Specimen: Urine, Random  Result Value Ref Range Status   Specimen Description   Final    URINE, RANDOM Performed at [facility], [address], [place], [place] [address]    Special Requests   Final    NONE Reflexed from [id number] Performed at [facility], [address] [place], [place] [address]    Culture   Final    NO GROWTH Performed at [facility], [place] [address] [place], [place] [address]    Report Status 09/20/2022 FINAL  Final    Coagulation Studies: No results for input(s): "LABPROT", "INR" in the last 72 hours.  Urinalysis: Recent Labs    09/19/22 0301  COLORURINE YELLOW*  LABSPEC 1.015  PHURINE 6.0  GLUCOSEU NEGATIVE  HGBUR LARGE*  BILIRUBINUR NEGATIVE  KETONESUR NEGATIVE  PROTEINUR 100*  NITRITE NEGATIVE  LEUKOCYTESUR TRACE*      Imaging: CT Renal Stone Study  Result Date: 09/19/2022 CLINICAL DATA:  Bladder neck obstruction. EXAM: CT ABDOMEN AND PELVIS WITHOUT CONTRAST TECHNIQUE: Multidetector CT imaging of the abdomen and pelvis was performed following the standard protocol without IV contrast. RADIATION DOSE REDUCTION: This exam was performed according to the departmental dose-optimization program which includes automated exposure control, adjustment of the mA and/or kV according to patient size and/or use of iterative reconstruction technique. COMPARISON:  CT abdomen pelvis dated 01/12/2022. FINDINGS: Evaluation of this exam is limited in the  absence of intravenous contrast. Lower chest: The visualized lung bases are clear. There is coronary vascular calcification. No intra-abdominal free air or free fluid. Hepatobiliary: Subcentimeter hypodense lesion in the right lobe of the liver is too small to characterize but was present on the prior CT, likely a cyst. The liver is otherwise unremarkable. No biliary dilatation. The gallbladder is unremarkable. Pancreas: Unremarkable. No pancreatic ductal dilatation or surrounding inflammatory changes. Spleen: Normal in size without focal abnormality. Adrenals/Urinary Tract: The adrenal glands unremarkable. Mild bilateral renal parenchyma atrophy. There is moderate right hydronephrosis, increased since the prior CT. There is mild right hydroureter. No stone identified. There is no hydronephrosis or nephrolithiasis on the left. There is a 3.8 x 2.3 cm mass in the base of the bladder which may represent median hypertrophy of the prostate gland but concerning for a bladder urothelial neoplasm. Further evaluation with cystoscopy is recommended. Small pocket of air within the bladder likely introduced via recent catheterization. Stomach/Bowel: Postsurgical changes of the bowel with a left anterior loop colostomy. There is no bowel obstruction or active inflammation. The appendix is normal. Vascular/Lymphatic: Moderate aortoiliac atherosclerotic disease. The IVC is unremarkable. No portal venous gas. There is no adenopathy. Reproductive: The prostate gland is grossly unremarkable. A Foley catheter with compressed balloon in the bulbar urethra. Recommend retraction and repositioning. Other: None Musculoskeletal: There is deep sacral ulcer extending from the skin to the coccyx. There is slight erosion of the tip of the coccyx as seen on the prior CT, likely chronic. No drainable fluid collection or abscess. Degenerative changes of the spine. No acute osseous pathology. IMPRESSION: 1. Moderate right hydronephrosis, increased  since the prior CT. No stone identified. 2. A 3.8 x 2.3 cm mass in the base of the bladder may represent protruding median hypertrophy of the prostate gland but concerning for a bladder urothelial neoplasm. Further evaluation with cystoscopy is recommended. 3. A Foley catheter with compressed balloon in the bulbar urethra. Recommend retraction and repositioning. 4. Deep sacral ulcer extending from the skin to the coccyx. No drainable fluid collection or abscess. 5. Postsurgical changes of the bowel with a left anterior loop colostomy. No bowel obstruction. Normal appendix. 6.  Aortic Atherosclerosis (ICD10-I70.0). Electronically Signed   By: Anner Crete M.D.   On: 09/19/2022 01:25     Medications:    cefTRIAXone (ROCEPHIN)  IV Stopped (09/20/22 1100)   furosemide (LASIX) 200 mg in dextrose 5 % 100 mL (2 mg/mL) infusion 4 mg/hr (09/20/22 1101)   sodium chloride irrigation Stopped (09/19/22 0340)    cholecalciferol  1,000 Units Oral Daily   cyanocobalamin  3,000 mcg Oral Daily   insulin aspart  0-5 Units Subcutaneous QHS   insulin aspart  0-9 Units Subcutaneous TID WC   memantine  10 mg Oral BID   mupirocin  ointment   Topical Daily   omega-3 acid ethyl esters  1 capsule Oral Daily   Oxcarbazepine  300 mg Oral BID   rosuvastatin  20 mg Oral Daily   sodium bicarbonate  650 mg Oral TID   acetaminophen **OR** acetaminophen, magnesium hydroxide, ondansetron **OR** ondansetron (ZOFRAN) IV, QUEtiapine, traZODone  Assessment/ Plan:  [name] is a 67 y.o.  male with frontotemporal dementia, bipolar disorder, traumatic brain injury, PE/DVT, diabetes mellitus type II, hypertension, neurogenic bladder who was admitted to [facility] on 09/18/2022 for Hematuria [R31.9]   Acute kidney injury on chronic kidney disease stage IIIB: baseline creatinine of 1.72, GFR of 43 on 03/22/22. Secondary to obstructive uropathy and possibly acute cardiorenal syndrome secondary to acute exacerbation of  congestive heart failure.  Creatinine slightly elevated today, UOP 3.7L. Continue to hold Bactrim and olmasartan  Lab Results  Component Value Date   CREATININE 2.79 (H) 09/20/2022   CREATININE 2.68 (H) 09/19/2022   CREATININE 2.69 (H) 09/19/2022    Intake/Output Summary (Last 24 hours) at 09/20/2022 1214 Last data filed at 09/20/2022 0948 Gross per 24 hour  Intake --  Output 4800 ml  Net -4800 ml   Hyponatremia: hypervolemic. Secondary to congestive heart failure.  Sodium corrected to 135   Hyperkalemia: with acute kidney injury and chronic Bactrim use.  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 With possible trauma from foley catheter.  Urology consulted and is concerned about bladder base mass that may be obstructing bladder. Renal ultrasound ordered.    Acute exacerbation of chronic diastolic congestive heart failure. Continue Furosemide drip    LOS: 1   3/22/202412:14 PM

## 2022-09-20 NOTE — Progress Notes (Signed)
STAT RUS remains pending. Regardless, patient will need to be off Eliquis x48 hours prior to PCN placement. Recommend the following:  -Resume diet for today  -Follow RUS results -Continue to hold Eliquis with a tentative plan for right PCN on Sunday vs attempted right ureteral stent placement with Dr. Jeffie Pollock tomorrow if he acutely worsens overnight, assuming no improvement in renal function -NPO at midnight tonight pending reassessment in the AM  [facility], PA-C 09/20/22 1:25 PM

## 2022-09-20 NOTE — ED Notes (Signed)
ED TO INPATIENT HANDOFF REPORT  ED Nurse Name and Phone #: Baxter Flattery, RN  S Name/Age/Gender [name] 67 y.o. male Room/Bed: ED38A/ED38A  Code Status   Code Status: Full Code  Home/SNF/Other Home Patient oriented to: self, place, time, and situation Is this baseline? Yes   Triage Complete: Triage complete  Chief Complaint Hematuria [R31.9]  Triage Note BIB EMS/ foley placed to today by home health nurse/ bright red blood noticed in bag by nurse/ foley removed/ abd is distended/ pt denies pain/ hx of kidney failure and recurrent UTIs    Allergies No Known Allergies  Level of Care/Admitting Diagnosis ED Disposition     ED Disposition  Admit   Condition  --   [place]: [place] [[id number]]  Level of Care: Telemetry Medical [[id number]]  Covid Evaluation: Asymptomatic - no recent exposure (last 10 days) testing not required  Diagnosis: Hematuria [id number]  Admitting Physician: Christel Mormon [id number]  Attending Physician: Christel Mormon [id number]  Certification:: I certify this patient will need inpatient services for at least 2 midnights  Estimated Length of Stay: 2          B Medical/Surgery History Past Medical History:  Diagnosis Date   Bipolar disorder ([place])    Diabetes mellitus without complication ([place])    History of blood clots    Hypertension    TBI (traumatic brain injury) ([place])    Past Surgical History:  Procedure Laterality Date   BACK SURGERY     CARPAL TUNNEL RELEASE Bilateral    COLON SURGERY     COLOSTOMY     IR PERC TUN PERIT CATH WO PORT S&I /IMAG  10/11/2021   IR REMOVAL TUN CV CATH W/O FL  11/19/2021   IR US GUIDE VASC ACCESS RIGHT  10/11/2021   TONSILLECTOMY       A IV Location/Drains/Wounds Patient Lines/Drains/Airways Status     Active Line/Drains/Airways     Name Placement date Placement time Site Days   Peripheral IV 09/20/22 20 G Anterior;Left;Proximal Forearm 09/20/22  0448  Forearm   less than 1   Colostomy LLQ 01/17/20  0924  LLQ  977   Urethral Catheter [place] RN Coude 16 Fr. 09/19/22  0243  Coude  1   Pressure Injury 02/23/22 Buttocks Bilateral Unstageable - Full thickness tissue loss in which the base of the injury is covered by slough (yellow, tan, gray, green or brown) and/or eschar (tan, brown or black) in the wound bed. Reddened white area around 02/23/22  1515  -- 209   Pressure Injury 02/23/22 Heel Left;Posterior 02/23/22  1515  -- 209   Pressure Injury Heel Right --  --  -- --            Intake/Output Last 24 hours  Intake/Output Summary (Last 24 hours) at 09/20/2022 1154 Last data filed at 09/20/2022 0948 Gross per 24 hour  Intake --  Output 4800 ml  Net -4800 ml    Labs/Imaging Results for orders placed or performed during the hospital encounter of 09/18/22 (from the past 48 hour(s))  CBC with Differential     Status: Abnormal   Collection Time: 09/18/22 11:47 PM  Result Value Ref Range   WBC 9.5 4.0 - 10.5 K/uL   RBC 3.41 (L) 4.22 - 5.81 MIL/uL   Hemoglobin 8.8 (L) 13.0 - 17.0 g/dL   HCT 28.5 (L) 39.0 - 52.0 %   MCV 83.6 80.0 - 100.0 fL   MCH 25.8 (L)  26.0 - 34.0 pg   MCHC 30.9 30.0 - 36.0 g/dL   RDW 18.2 (H) 11.5 - 15.5 %   Platelets 214 150 - 400 K/uL   nRBC 0.0 0.0 - 0.2 %   Neutrophils Relative % 74 %   Neutro Abs 6.9 1.7 - 7.7 K/uL   Lymphocytes Relative 15 %   Lymphs Abs 1.4 0.7 - 4.0 K/uL   Monocytes Relative 6 %   Monocytes Absolute 0.6 0.1 - 1.0 K/uL   Eosinophils Relative 5 %   Eosinophils Absolute 0.5 0.0 - 0.5 K/uL   Basophils Relative 0 %   Basophils Absolute 0.0 0.0 - 0.1 K/uL   Immature Granulocytes 0 %   Abs Immature Granulocytes 0.04 0.00 - 0.07 K/uL    Comment: Performed at [facility], [address], [place], [place] [id number]  Basic metabolic panel     Status: Abnormal   Collection Time: 09/18/22 11:47 PM  Result Value Ref Range   Sodium 125 (L) 135 - 145 mmol/L   Potassium 7.1 (HH) 3.5 - 5.1  mmol/L    Comment: HEMOLYSIS AT THIS LEVEL MAY AFFECT RESULT CRITICAL RESULT CALLED TO, READ BACK BY AND VERIFIED WITH Baldemar Friday @0010  on 09/19/22 SKL    Chloride 104 98 - 111 mmol/L   CO2 16 (L) 22 - 32 mmol/L   Glucose, Bld 155 (H) 70 - 99 mg/dL    Comment: Glucose reference range applies only to samples taken after fasting for at least 8 hours.   BUN 45 (H) 8 - 23 mg/dL   Creatinine, Ser 2.94 (H) 0.61 - 1.24 mg/dL   Calcium 8.0 (L) 8.9 - 10.3 mg/dL   GFR, Estimated 23 (L) >60 mL/min    Comment: (NOTE) Calculated using the CKD-EPI Creatinine Equation (2021)    Anion gap 5 5 - 15    Comment: Performed at [facility], [address] [place], [place] [id number]  Basic metabolic panel     Status: Abnormal   Collection Time: 09/19/22 12:39 AM  Result Value Ref Range   Sodium 129 (L) 135 - 145 mmol/L   Potassium 5.7 (H) 3.5 - 5.1 mmol/L   Chloride 103 98 - 111 mmol/L   CO2 18 (L) 22 - 32 mmol/L   Glucose, Bld 145 (H) 70 - 99 mg/dL    Comment: Glucose reference range applies only to samples taken after fasting for at least 8 hours.   BUN 44 (H) 8 - 23 mg/dL   Creatinine, Ser 2.69 (H) 0.61 - 1.24 mg/dL   Calcium 7.9 (L) 8.9 - 10.3 mg/dL   GFR, Estimated 25 (L) >60 mL/min    Comment: (NOTE) Calculated using the CKD-EPI Creatinine Equation (2021)    Anion gap 8 5 - 15    Comment: Performed at [facility], [address] [place], [place] [address]  Urinalysis, w/ Reflex to Culture (Infection Suspected) -Urine, Catheterized; Indwelling urinary catheter     Status: Abnormal   Collection Time: 09/19/22  3:01 AM  Result Value Ref Range   Specimen Source URINE, RANDOM     Comment: CORRECTED ON 03/21 AT 0316: PREVIOUSLY REPORTED AS URINE, CATHETERIZED   Color, Urine YELLOW (A) YELLOW   APPearance CLOUDY (A) CLEAR   Specific Gravity, Urine 1.015 1.005 - 1.030   pH 6.0 5.0 - 8.0   Glucose, UA NEGATIVE NEGATIVE mg/dL   Hgb urine dipstick LARGE (A) NEGATIVE    Bilirubin Urine NEGATIVE NEGATIVE   Ketones, ur  NEGATIVE NEGATIVE mg/dL   Protein, ur 100 (A) NEGATIVE mg/dL   Nitrite NEGATIVE NEGATIVE   Leukocytes,Ua TRACE (A) NEGATIVE   RBC / HPF >50 0 - 5 RBC/hpf   WBC, UA 11-20 0 - 5 WBC/hpf    Comment:        Reflex urine culture not performed if WBC <=10, OR if Squamous epithelial cells >5. If Squamous epithelial cells >5 suggest recollection.    Bacteria, UA NONE SEEN NONE SEEN   Squamous Epithelial / HPF NONE SEEN 0 - 5 /HPF    Comment: Performed at [facility], [address] [place], [place] [address]  Urine Culture     Status: None   Collection Time: 09/19/22  3:01 AM   Specimen: Urine, Random  Result Value Ref Range   Specimen Description      URINE, RANDOM Performed at [facility], [address], [place], [place] [address]    Special Requests      NONE Reflexed from [id number] Performed at [facility], [address] [place], [place] [address]    Culture      NO GROWTH Performed at [facility], [place] [address] [place], [place] [address]    Report Status 09/20/2022 FINAL   Basic metabolic panel     Status: Abnormal   Collection Time: 09/19/22  5:27 AM  Result Value Ref Range   Sodium 127 (L) 135 - 145 mmol/L    Comment: ELECTROLYTES REPEATED TO VERIFY   Potassium 5.5 (H) 3.5 - 5.1 mmol/L   Chloride 107 98 - 111 mmol/L   CO2 18 (L) 22 - 32 mmol/L   Glucose, Bld 140 (H) 70 - 99 mg/dL    Comment: Glucose reference range applies only to samples taken after fasting for at least 8 hours.   BUN 45 (H) 8 - 23 mg/dL   Creatinine, Ser 2.68 (H) 0.61 - 1.24 mg/dL   Calcium 7.9 (L) 8.9 - 10.3 mg/dL   GFR, Estimated 25 (L) >60 mL/min    Comment: (NOTE) Calculated using the CKD-EPI Creatinine Equation (2021)    Anion gap 2 (L) 5 - 15    Comment: Performed at [facility], [address] [place], [place] [address]  CBC     Status: Abnormal   Collection Time: 09/19/22  5:27 AM   Result Value Ref Range   WBC 10.1 4.0 - 10.5 K/uL   RBC 3.16 (L) 4.22 - 5.81 MIL/uL   Hemoglobin 8.2 (L) 13.0 - 17.0 g/dL   HCT 26.4 (L) 39.0 - 52.0 %   MCV 83.5 80.0 - 100.0 fL   MCH 25.9 (L) 26.0 - 34.0 pg   MCHC 31.1 30.0 - 36.0 g/dL   RDW 17.9 (H) 11.5 - 15.5 %   Platelets 193 150 - 400 K/uL   nRBC 0.0 0.0 - 0.2 %    Comment: Performed at [facility], [address] [place], [place] [address]  Hemoglobin A1c     Status: Abnormal   Collection Time: 09/19/22  5:27 AM  Result Value Ref Range   Hgb A1c MFr Bld 7.0 (H) 4.8 - 5.6 %    Comment: (NOTE)         Prediabetes: 5.7 - 6.4         Diabetes: >6.4         Glycemic control for adults with diabetes: <7.0    Mean Plasma Glucose 154 mg/dL    Comment: (NOTE) Performed  At: [facility] [address] [place], [place] [id number] Rush Farmer MD [id number]   CBG monitoring, ED     Status: Abnormal   Collection Time: 09/19/22  8:04 AM  Result Value Ref Range   Glucose-Capillary 104 (H) 70 - 99 mg/dL    Comment: Glucose reference range applies only to samples taken after fasting for at least 8 hours.  Hemoglobin and hematocrit, blood     Status: Abnormal   Collection Time: 09/19/22  8:13 AM  Result Value Ref Range   Hemoglobin 8.0 (L) 13.0 - 17.0 g/dL   HCT 26.2 (L) 39.0 - 52.0 %    Comment: Performed at [facility], [address] [place], [place] [address]  CBG monitoring, ED     Status: Abnormal   Collection Time: 09/19/22 11:36 AM  Result Value Ref Range   Glucose-Capillary 128 (H) 70 - 99 mg/dL    Comment: Glucose reference range applies only to samples taken after fasting for at least 8 hours.  CBG monitoring, ED     Status: Abnormal   Collection Time: 09/19/22  5:09 PM  Result Value Ref Range   Glucose-Capillary 101 (H) 70 - 99 mg/dL    Comment: Glucose reference range applies only to samples taken after fasting for at least 8 hours.  Hemoglobin and hematocrit, blood     Status:  Abnormal   Collection Time: 09/19/22  7:18 PM  Result Value Ref Range   Hemoglobin 8.2 (L) 13.0 - 17.0 g/dL   HCT 26.8 (L) 39.0 - 52.0 %    Comment: Performed at [facility], [address] [place], [place] [address]  Osmolality     Status: Abnormal   Collection Time: 09/19/22  7:18 PM  Result Value Ref Range   Osmolality 298 (H) 275 - 295 mOsm/kg    Comment: REPEATED TO VERIFY Performed at [facility], [address] [place], [place] [address]   VITAMIN D 25 Hydroxy (Vit-D Deficiency, Fractures)     Status: Abnormal   Collection Time: 09/19/22  7:18 PM  Result Value Ref Range   Vit D, 25-Hydroxy 18.68 (L) 30 - 100 ng/mL    Comment: (NOTE) Vitamin D deficiency has been defined by the [place] practice guideline as a level of serum 25-OH  vitamin D less than 20 ng/mL (1,2). The Endocrine Society went on to  further define vitamin D insufficiency as a level between 21 and 29  ng/mL (2).  1. IOM (Institute of Medicine). 2010. Dietary reference intakes for  calcium and D. [place]: The [employer]. 2. Holick MF, Binkley Parker, Bischoff-Ferrari HA, et al. Evaluation,  treatment, and prevention of vitamin D deficiency: an Endocrine  Society clinical practice guideline, JCEM. 2011 Jul; 96(7): 1911-30.  Performed at [facility], [place] [address], [place],  [address]   CBG monitoring, ED     Status: Abnormal   Collection Time: 09/19/22 10:07 PM  Result Value Ref Range   Glucose-Capillary 126 (H) 70 - 99 mg/dL    Comment: Glucose reference range applies only to samples taken after fasting for at least 8 hours.  CBC     Status: Abnormal   Collection Time: 09/20/22  4:38 AM  Result Value Ref Range   WBC 6.5 4.0 - 10.5 K/uL   RBC 3.15 (L) 4.22 - 5.81 MIL/uL   Hemoglobin 8.1 (L) 13.0 - 17.0 g/dL   HCT 26.4 (L) 39.0 -  52.0 %   MCV 83.8 80.0 - 100.0 fL   MCH 25.7 (L) 26.0 - 34.0 pg   MCHC 30.7  30.0 - 36.0 g/dL   RDW 18.0 (H) 11.5 - 15.5 %   Platelets 201 150 - 400 K/uL   nRBC 0.0 0.0 - 0.2 %    Comment: Performed at [facility], [address] [place], [place] [id number]  Basic metabolic panel     Status: Abnormal   Collection Time: 09/20/22  4:38 AM  Result Value Ref Range   Sodium 135 135 - 145 mmol/L   Potassium 5.3 (H) 3.5 - 5.1 mmol/L   Chloride 104 98 - 111 mmol/L   CO2 19 (L) 22 - 32 mmol/L   Glucose, Bld 130 (H) 70 - 99 mg/dL    Comment: Glucose reference range applies only to samples taken after fasting for at least 8 hours.   BUN 43 (H) 8 - 23 mg/dL   Creatinine, Ser 2.79 (H) 0.61 - 1.24 mg/dL   Calcium 8.9 8.9 - 10.3 mg/dL   GFR, Estimated 24 (L) >60 mL/min    Comment: (NOTE) Calculated using the CKD-EPI Creatinine Equation (2021)    Anion gap 12 5 - 15    Comment: Performed at [facility], [address], [place], [place] [address]  Magnesium     Status: None   Collection Time: 09/20/22  4:38 AM  Result Value Ref Range   Magnesium 1.9 1.7 - 2.4 mg/dL    Comment: Performed at [facility], [address], [place], [place] [address]  Phosphorus     Status: None   Collection Time: 09/20/22  4:38 AM  Result Value Ref Range   Phosphorus 3.7 2.5 - 4.6 mg/dL    Comment: Performed at [facility], [address] [place], [place] [address]  Ferritin     Status: None   Collection Time: 09/20/22  4:38 AM  Result Value Ref Range   Ferritin 34 24 - 336 ng/mL    Comment: Performed at [facility], [place], [place] [address]  Iron and TIBC     Status: Abnormal   Collection Time: 09/20/22  4:38 AM  Result Value Ref Range   Iron 42 (L) 45 - 182 ug/dL   TIBC 258 250 - 450 ug/dL   Saturation Ratios 19 17.9 - 39.5 %   UIBC 206 ug/dL    Comment: Performed at [facility], [address] [place], [place] [address]  Hepatitis B core antibody, IgM     Status: None   Collection Time: 09/20/22   4:38 AM  Result Value Ref Range   Hep B C IgM NON REACTIVE NON REACTIVE    Comment: Performed at [facility], [place] [address] [place], [place] [address]  Hepatitis B core antibody, total     Status: None   Collection Time: 09/20/22  4:38 AM  Result Value Ref Range   Hep B Core Total Ab NON REACTIVE NON REACTIVE    Comment: Performed at [place] [address], [place], [place] [address]  Hepatitis B surface antigen     Status: None   Collection Time: 09/20/22  4:38 AM  Result Value Ref Range   Hepatitis B Surface Ag NON REACTIVE NON REACTIVE    Comment: Performed at [facility] [address] [place], [place] [address]  CBG monitoring, ED     Status: Abnormal  Collection Time: 09/20/22  7:23 AM  Result Value Ref Range   Glucose-Capillary 108 (H) 70 - 99 mg/dL    Comment: Glucose reference range applies only to samples taken after fasting for at least 8 hours.   Comment 1 Notify RN   CBG monitoring, ED     Status: Abnormal   Collection Time: 09/20/22 11:14 AM  Result Value Ref Range   Glucose-Capillary 100 (H) 70 - 99 mg/dL    Comment: Glucose reference range applies only to samples taken after fasting for at least 8 hours.   Comment 1 Notify RN    CT Renal Stone Study  Result Date: 09/19/2022 CLINICAL DATA:  Bladder neck obstruction. EXAM: CT ABDOMEN AND PELVIS WITHOUT CONTRAST TECHNIQUE: Multidetector CT imaging of the abdomen and pelvis was performed following the standard protocol without IV contrast. RADIATION DOSE REDUCTION: This exam was performed according to the departmental dose-optimization program which includes automated exposure control, adjustment of the mA and/or kV according to patient size and/or use of iterative reconstruction technique. COMPARISON:  CT abdomen pelvis dated 01/12/2022. FINDINGS: Evaluation of this exam is limited in the absence of intravenous contrast. Lower chest: The visualized lung bases are clear. There is coronary  vascular calcification. No intra-abdominal free air or free fluid. Hepatobiliary: Subcentimeter hypodense lesion in the right lobe of the liver is too small to characterize but was present on the prior CT, likely a cyst. The liver is otherwise unremarkable. No biliary dilatation. The gallbladder is unremarkable. Pancreas: Unremarkable. No pancreatic ductal dilatation or surrounding inflammatory changes. Spleen: Normal in size without focal abnormality. Adrenals/Urinary Tract: The adrenal glands unremarkable. Mild bilateral renal parenchyma atrophy. There is moderate right hydronephrosis, increased since the prior CT. There is mild right hydroureter. No stone identified. There is no hydronephrosis or nephrolithiasis on the left. There is a 3.8 x 2.3 cm mass in the base of the bladder which may represent median hypertrophy of the prostate gland but concerning for a bladder urothelial neoplasm. Further evaluation with cystoscopy is recommended. Small pocket of air within the bladder likely introduced via recent catheterization. Stomach/Bowel: Postsurgical changes of the bowel with a left anterior loop colostomy. There is no bowel obstruction or active inflammation. The appendix is normal. Vascular/Lymphatic: Moderate aortoiliac atherosclerotic disease. The IVC is unremarkable. No portal venous gas. There is no adenopathy. Reproductive: The prostate gland is grossly unremarkable. A Foley catheter with compressed balloon in the bulbar urethra. Recommend retraction and repositioning. Other: None Musculoskeletal: There is deep sacral ulcer extending from the skin to the coccyx. There is slight erosion of the tip of the coccyx as seen on the prior CT, likely chronic. No drainable fluid collection or abscess. Degenerative changes of the spine. No acute osseous pathology. IMPRESSION: 1. Moderate right hydronephrosis, increased since the prior CT. No stone identified. 2. A 3.8 x 2.3 cm mass in the base of the bladder may  represent protruding median hypertrophy of the prostate gland but concerning for a bladder urothelial neoplasm. Further evaluation with cystoscopy is recommended. 3. A Foley catheter with compressed balloon in the bulbar urethra. Recommend retraction and repositioning. 4. Deep sacral ulcer extending from the skin to the coccyx. No drainable fluid collection or abscess. 5. Postsurgical changes of the bowel with a left anterior loop colostomy. No bowel obstruction. Normal appendix. 6.  Aortic Atherosclerosis (ICD10-I70.0). Electronically Signed   By: Anner Crete M.D.   On: 09/19/2022 01:25    Pending Labs Unresulted Labs (From admission, onward)  Start     Ordered   09/20/22 0820  Vitamin B12  Add-on,   AD        09/20/22 0819   09/20/22 0500  CBC  Daily,   R      09/19/22 0807   09/20/22 [id number]  Basic metabolic panel  Daily,   R      09/19/22 0807   09/20/22 0500  Magnesium  Daily,   R      09/19/22 0807   09/20/22 0500  Phosphorus  Daily,   R      09/19/22 0807   09/20/22 0500  Parathyroid hormone, intact (no Ca)  Tomorrow morning,   R        09/19/22 1331   09/20/22 0500  Protein electrophoresis, serum  Tomorrow morning,   R        09/19/22 1331   09/20/22 0500  Kappa/lambda light chains  Tomorrow morning,   R        09/19/22 1331            Vitals/Pain Today's Vitals   09/20/22 0604 09/20/22 0700 09/20/22 0900 09/20/22 1028  BP:  (!) 95/55 (!) 140/74   Pulse:  (!) 53 67   Resp:  10 17   Temp:    98.5 F (36.9 C)  TempSrc:    Oral  SpO2:  99% 99%   Weight:      PainSc: Asleep       Isolation Precautions Contact precautions  Medications Medications  sodium chloride irrigation 0.9 % 3,000 mL (0 mLs Irrigation Stopped 09/19/22 0340)  rosuvastatin (CRESTOR) tablet 20 mg (0 mg Oral Hold 09/20/22 0932)  memantine (NAMENDA) tablet 10 mg (0 mg Oral Hold 09/20/22 0933)  QUEtiapine (SEROQUEL) tablet 50 mg (has no administration in time range)  cyanocobalamin (VITAMIN  B12) tablet 3,000 mcg (0 mcg Oral Hold 09/20/22 0933)  Oxcarbazepine (TRILEPTAL) tablet 300 mg (0 mg Oral Hold 09/20/22 0933)  cholecalciferol (VITAMIN D3) 25 MCG (1000 UNIT) tablet 1,000 Units (0 Units Oral Hold 09/20/22 0936)  omega-3 acid ethyl esters (LOVAZA) capsule 1 g (0 g Oral Hold 09/20/22 0936)  acetaminophen (TYLENOL) tablet 650 mg (has no administration in time range)    Or  acetaminophen (TYLENOL) suppository 650 mg (has no administration in time range)  traZODone (DESYREL) tablet 25 mg (has no administration in time range)  magnesium hydroxide (MILK OF MAGNESIA) suspension 30 mL (has no administration in time range)  ondansetron (ZOFRAN) tablet 4 mg (has no administration in time range)    Or  ondansetron (ZOFRAN) injection 4 mg (has no administration in time range)  insulin aspart (novoLOG) injection 0-9 Units ( Subcutaneous Not Given 09/20/22 1130)  insulin aspart (novoLOG) injection 0-5 Units ( Subcutaneous Not Given 09/19/22 2207)  sodium zirconium cyclosilicate (LOKELMA) packet 10 g ( Oral Canceled Entry 09/20/22 0722)  sodium bicarbonate tablet 650 mg (0 mg Oral Hold 09/20/22 0936)  cefTRIAXone (ROCEPHIN) 1 g in sodium chloride 0.9 % 100 mL IVPB (0 g Intravenous Stopped 09/20/22 1100)  furosemide (LASIX) 200 mg in dextrose 5 % 100 mL (2 mg/mL) infusion (4 mg/hr Intravenous Restarted 09/20/22 1101)  mupirocin ointment (BACTROBAN) 2 % ( Topical Not Given 09/20/22 0936)  sodium chloride 0.9 % bolus 1,000 mL (0 mLs Intravenous Stopped 09/19/22 0536)  patiromer Daryll Drown) packet 8.4 g (8.4 g Oral Given 09/19/22 [id number])    Mobility walks with device     Focused Assessments Cardiac Assessment Handoff:    Lab  Results  Component Value Date   TROPONINI <0.03 01/02/2016   Lab Results  Component Value Date   DDIMER 1.83 (H) 07/30/2021   Does the Patient currently have chest pain? No    R Recommendations: See Admitting Provider Note  Report given to:   Additional Notes:

## 2022-09-20 NOTE — Progress Notes (Signed)
Attempted to contact patient's wife via telephone to discuss care plan, phone went directly to voicemail.

## 2022-09-20 NOTE — Progress Notes (Signed)
Daily Progress Note   Patient Name: [name]       Date: 09/20/2022 DOB: 1955/09/09  Age: 67 y.o. MRN#: [id number] Attending Physician: Sharen Hones, MD Primary Care Physician: [name], MD Admit Date: 09/18/2022  Reason for Consultation/Follow-up: Establishing goals of care  HPI/Brief Hospital Review: 67 y.o. 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    Patient is being treated for hyponatremia, hyperkalemia, and hydronephrosis.  CT revealed a bladder mass for which follow-up cystoscopy is recommended. Urology consulted. He is an established patient with [facility] in [place].     Of note, patient had 7 hospitalizations in 2023.    PMT was consulted to discuss goals of care.  Subjective: Extensive chart review has been completed prior to meeting patient including labs, vital signs, imaging, progress notes, orders, and available advanced directive documents from current and previous encounters.    Met with Mr. Snide at his bedside. Wife not present during initial visit-spoke on phone and planned to meet at bedside later in the afternoon. Mr. Stumpo denies acute pain or discomfort. Awaiting renal ultrasound in order to eat breakfast.  Remains on furosemide infusion-creatinine 2.79, potassium 5.3  Met with wife-[name] at bedside with Mr. Venhuizen. [name] able to share her understanding of new findings of bladder mass and  hydronephrosis. Understands procedure is on hold for 48 hours due to being on Eliquis.   [name] shares she and Mr. Town rely heavily on their faith and take each moment as it comes. She shares she does not allow herself or Mr. Sumrow to dwell on negative outcomes. She is focused on positive outcomes and that Mr. Vanamburg will return to his baseline of health and return home.  Attempted to illicit goals of care. Mr. Korte has not completed Advanced Care documentation in the past. He and [name] share that they wish for him to receive all necessary treatments or interventions.  Encouraged patient/family to consider DNR/DNI status understanding evidenced based poor outcomes in similar hospitalized patients, as the cause of the arrest is likely associated with chronic/terminal disease rather than  a reversible acute cardio-pulmonary event. [name] and Mr. Lienhart both share they desire him to remain a Full Code.  He would want long term mechanical ventilation/tracheostomy if necessary. He would want LTC placement if necessary. He would want to pursue dialysis if necessary. He would want to pursue treatment for bladder mass if necessary.  Addressed all questions and concerns.  Mr. Wadley requests visit from [place] services for spiritual visits while he remains in the hospital.  PMT to continue to follow for ongoing needs and support.   Objective:  Physical Exam Constitutional:      General: He is not in acute distress.    Appearance: He is ill-appearing.  Pulmonary:     Effort: Pulmonary effort is normal. No respiratory distress.  Abdominal:     Palpations: Abdomen is soft.     Tenderness: There is no abdominal tenderness.  Skin:    General: Skin is warm and dry.  Neurological:     Mental Status: He is alert.             Vital Signs: BP 131/77   Pulse 63   Temp 97.7 F (36.5 C)   Resp 17   Wt 128.7 kg   SpO2 99%   BMI 45.80 kg/m   SpO2: SpO2: 99 % O2 Device: O2 Device: Room Air O2 Flow Rate:     Palliative Care Assessment & Plan   Assessment/Recommendation/Plan  Full Code-Full Scope Encourage Completion of AD Order placed for Chaplain services for spiritual visits per patient request Ongoing [place] conversations needed-PMT will continue to follow   Thank you for allowing the Palliative Medicine Team to assist in the care of this patient.  Total time:  35 minutes  Time spent includes: Detailed review of medical records (labs, imaging, vital signs), medically appropriate exam (mental status, respiratory, cardiac, skin), discussed with treatment team, counseling and educating patient, family and staff, documenting clinical information, medication management and coordination of care.  Theodoro Grist, DNP, AGNP-C Palliative Medicine   Please contact Palliative Medicine Team phone at [phone] for questions and concerns.

## 2022-09-20 NOTE — Consult Note (Signed)
WOC consulted for sacrum, was seen and orders entered yesterday for same. See wound care orders.  Will not consult for that reason Thanks  Kingdavid Leinbach [facility] MSN, RN,CWOCN, CNS, CWON-AP [phone])

## 2022-09-20 NOTE — Progress Notes (Signed)
  Progress Note   [name] [id number] DOB: 1956-01-17 DOA: 09/18/2022     1 DOS: the patient was seen and examined on 09/20/2022   Brief hospital course: Vitor Sondgeroth is a 67 y.o. African-American male with medical history significant for bipolar disorder, type 2 diabetes mellitus without complications, hypertension, TBI, and neurogenic bladder with chronic indwelling Foley cath, who presented to the emergency room with acute onset of hematuria.  Labs revealed hyponatremia of 129 and potassium of 7.1, CO2 16, creatinine 2.69. UA showed 11-20 WBCs with more than 50 RBCs and trace leukocytes. CT scan showed moderate right hydronephrosis, increased since the prior CT. A 3.8 x 2.3 cm mass in the base of the bladder.  Consult from urology and nephrology was obtained.  Potassium normalized after giving fluids.  Patient is pending nephrostomy tube placement.   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   right hydronephrosis. Patient hematuria is better, discussed with nephrology, patient will need right nephrostomy.  Last dose of Eliquis was 2 AM this morning, will need to wait for 48 hours before procedure can be performed.  Acute kidney injury superimposed on chronic kidney disease ([place]) secondary to obstruction. Hyperkalemia secondary to acute renal failure. Hyponatremia Metabolic acidosis. Appreciate nephrology consult.  Continue to follow.  Potassium is better on sodium bicarb and  orally.  Sacral decubitus ulcer Wound care consult pending.  Dyslipidemia Essential hypertension Resume home treatment.  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  Type 2 diabetes mellitus without complications ([place]) - She will be placed on supplemental coverage with NovoLog.  History of DVT. Hold off anticoagulation for surgery     Subjective:  Patient doing well today, denies any fever or chills.  No nausea vomiting.  Physical Exam: Vitals:   09/20/22 0900 09/20/22 1028 09/20/22 1100 09/20/22 1250  BP: (!) 140/74  122/67 131/77  Pulse: 67  (!) 56 63  Resp: 17  14 17   Temp:  98.5 F (36.9 C)  97.7 F (36.5 C)  TempSrc:  Oral    SpO2: 99%  100% 99%  Weight:       General exam: Appears calm and comfortable  Respiratory system: Clear to auscultation. Respiratory effort normal. Cardiovascular system: S1 & S2 heard, RRR. No JVD, murmurs, rubs, gallops or clicks. No pedal edema. Gastrointestinal system: Abdomen is nondistended, soft and nontender. No organomegaly or masses felt. Normal bowel sounds heard. Central nervous system: Alert and oriented x3. No focal neurological deficits. Extremities: Symmetric 5 x 5 power. Skin: No rashes, lesions or ulcers Psychiatry:  Mood & affect appropriate.    Data Reviewed:  Reviewed CT scan results, lab results.  Family Communication: wife updated  Disposition: Status is: Inpatient Remains inpatient appropriate because: Severity of disease, IV treatment.     Time spent: 50 minutes  Author: Sharen Hones, MD 09/20/2022 1:52 PM  For on call review [email or web address].

## 2022-09-20 NOTE — ED Notes (Signed)
Spoke with Floor coverage. Advised to monitor for fluid collection. No fluid seen. Wound Consult provided.

## 2022-09-20 NOTE — ED Notes (Signed)
Walked into room to reposition the patient. Patient was asleep. Removed covers and noticed patients pad was wet with serosanguinous fluid. Patient's foley catheter was checked. No kinds were found and urine in the collection bag is straw/yellow. Patient was rolled to his side. Sacral dressing is also still intact. MD was paged.

## 2022-09-21 ENCOUNTER — Inpatient Hospital Stay: Payer: Medicare Other

## 2022-09-21 DIAGNOSIS — Z515 Encounter for palliative care: Secondary | ICD-10-CM | POA: Diagnosis not present

## 2022-09-21 DIAGNOSIS — N1832 Chronic kidney disease, stage 3b: Secondary | ICD-10-CM

## 2022-09-21 DIAGNOSIS — N179 Acute kidney failure, unspecified: Secondary | ICD-10-CM | POA: Diagnosis not present

## 2022-09-21 DIAGNOSIS — N3289 Other specified disorders of bladder: Secondary | ICD-10-CM | POA: Diagnosis not present

## 2022-09-21 DIAGNOSIS — I5033 Acute on chronic diastolic (congestive) heart failure: Secondary | ICD-10-CM

## 2022-09-21 DIAGNOSIS — R310 Gross hematuria: Secondary | ICD-10-CM | POA: Diagnosis not present

## 2022-09-21 DIAGNOSIS — N189 Chronic kidney disease, unspecified: Secondary | ICD-10-CM | POA: Diagnosis not present

## 2022-09-21 LAB — MAGNESIUM: Magnesium: 1.9 mg/dL (ref 1.7–2.4)

## 2022-09-21 LAB — GLUCOSE, CAPILLARY
Glucose-Capillary: 136 mg/dL — ABNORMAL HIGH (ref 70–99)
Glucose-Capillary: 157 mg/dL — ABNORMAL HIGH (ref 70–99)
Glucose-Capillary: 146 mg/dL — ABNORMAL HIGH (ref 70–99)
Glucose-Capillary: 156 mg/dL — ABNORMAL HIGH (ref 70–99)

## 2022-09-21 LAB — CBC
HCT: 27.3 % — ABNORMAL LOW (ref 39.0–52.0)
Hemoglobin: 8.6 g/dL — ABNORMAL LOW (ref 13.0–17.0)
MCH: 25.9 pg — ABNORMAL LOW (ref 26.0–34.0)
MCHC: 31.5 g/dL (ref 30.0–36.0)
MCV: 82.2 fL (ref 80.0–100.0)
Platelets: 215 10*3/uL (ref 150–400)
RBC: 3.32 MIL/uL — ABNORMAL LOW (ref 4.22–5.81)
RDW: 17.7 % — ABNORMAL HIGH (ref 11.5–15.5)
WBC: 5.8 10*3/uL (ref 4.0–10.5)
nRBC: 0 % (ref 0.0–0.2)

## 2022-09-21 LAB — PHOSPHORUS: Phosphorus: 4.5 mg/dL (ref 2.5–4.6)

## 2022-09-21 LAB — BASIC METABOLIC PANEL
Anion gap: 7 (ref 5–15)
BUN: 53 mg/dL — ABNORMAL HIGH (ref 8–23)
CO2: 19 mmol/L — ABNORMAL LOW (ref 22–32)
Calcium: 8.3 mg/dL — ABNORMAL LOW (ref 8.9–10.3)
Chloride: 107 mmol/L (ref 98–111)
Creatinine, Ser: 3.01 mg/dL — ABNORMAL HIGH (ref 0.61–1.24)
GFR, Estimated: 22 mL/min — ABNORMAL LOW (ref >60)
Glucose, Bld: 129 mg/dL — ABNORMAL HIGH (ref 70–99)
Potassium: 4.9 mmol/L (ref 3.5–5.1)
Sodium: 133 mmol/L — ABNORMAL LOW (ref 135–145)

## 2022-09-21 LAB — VITAMIN B12: Vitamin B-12: 1003 pg/mL — ABNORMAL HIGH (ref 180–914)

## 2022-09-21 MED ORDER — SODIUM BICARBONATE 650 MG PO TABS
1300.0000 mg | ORAL_TABLET | Freq: Three times a day (TID) | ORAL | Status: AC
  Administered 2022-09-21 (×2): 1300 mg via ORAL
  Filled 2022-09-21 (×2): qty 2

## 2022-09-21 NOTE — Progress Notes (Signed)
Urology Inpatient Progress Note  Subjective: No acute events overnight.  He is afebrile, VSS. Hemoglobin up some at 8.6.  Creatinine up slightly at 3.01. Renal US shows mild/mod hydro on the right.  Bladder not well visualized but foley balloon is in good position.  Foley catheter in place draining clear, yellow urine. Patient denies pain.    Last Eliquis administered 09/18/20 at 2226.  Anti-infectives: Anti-infectives (From admission, onward)    Start     Dose/Rate Route Frequency Ordered Stop   09/19/22 1100  cefTRIAXone (ROCEPHIN) 1 g in sodium chloride 0.9 % 100 mL IVPB        1 g 200 mL/hr over 30 Minutes Intravenous Every 24 hours 09/19/22 1058         Current Facility-Administered Medications  Medication Dose Route Frequency Provider Last Rate Last Admin   acetaminophen (TYLENOL) tablet 650 mg  650 mg Oral Q6H PRN Mansy, Jan A, MD       Or   acetaminophen (TYLENOL) suppository 650 mg  650 mg Rectal Q6H PRN Mansy, Jan A, MD       cefTRIAXone (ROCEPHIN) 1 g in sodium chloride 0.9 % 100 mL IVPB  1 g Intravenous Q24H Val Riles, MD 200 mL/hr at 09/21/22 1020 1 g at 09/21/22 1020   Chlorhexidine Gluconate Cloth 2 % PADS 6 each  6 each Topical Daily Sharen Hones, MD   6 each at 09/21/22 0933   cholecalciferol (VITAMIN D3) 25 MCG (1000 UNIT) tablet 1,000 Units  1,000 Units Oral Daily Mansy, Jan A, MD   1,000 Units at 09/21/22 0932   cyanocobalamin (VITAMIN B12) tablet 3,000 mcg  3,000 mcg Oral Daily Mansy, Jan A, MD   3,000 mcg at 09/21/22 [id number]   furosemide (LASIX) 200 mg in dextrose 5 % 100 mL (2 mg/mL) infusion  4 mg/hr Intravenous Continuous Val Riles, MD 2 mL/hr at 09/20/22 1101 4 mg/hr at 09/20/22 1101   insulin aspart (novoLOG) injection 0-5 Units  0-5 Units Subcutaneous QHS Mansy, Jan A, MD       insulin aspart (novoLOG) injection 0-9 Units  0-9 Units Subcutaneous TID WC Mansy, Jan A, MD   1 Units at 09/21/22 0933   magnesium hydroxide (MILK OF MAGNESIA) suspension 30 mL   30 mL Oral Daily PRN Mansy, Jan A, MD       memantine [facility]) tablet 10 mg  10 mg Oral BID Mansy, Jan A, MD   10 mg at 09/21/22 0932   mupirocin ointment (BACTROBAN) 2 %   Topical Daily Val Riles, MD   Given at 09/21/22 1021   omega-3 acid ethyl esters (LOVAZA) capsule 1 g  1 capsule Oral Daily Mansy, Jan A, MD   1 g at 09/21/22 0934   ondansetron (ZOFRAN) tablet 4 mg  4 mg Oral Q6H PRN Mansy, Jan A, MD       Or   ondansetron [facility]) injection 4 mg  4 mg Intravenous Q6H PRN Mansy, Jan A, MD       Oxcarbazepine (TRILEPTAL) tablet 300 mg  300 mg Oral BID Mansy, Jan A, MD   300 mg at 09/21/22 0933   QUEtiapine (SEROQUEL) tablet 50 mg  50 mg Oral Daily PRN Mansy, Jan A, MD       rosuvastatin (CRESTOR) tablet 20 mg  20 mg Oral Daily Mansy, Jan A, MD   20 mg at 09/21/22 [id number]   sodium bicarbonate tablet 1,300 mg  1,300 mg Oral TID Sharen Hones, MD  sodium chloride irrigation 0.9 % 3,000 mL  3,000 mL Irrigation Continuous Paulette Blanch, MD   Stopped at 2022/10/11 0340   traZODone (DESYREL) tablet 25 mg  25 mg Oral QHS PRN Mansy, Jan A, MD       Objective: Vital signs in last 24 hours: Temp:  [97.7 F (36.5 C)-98.7 F (37.1 C)] 98.5 F (36.9 C) (03/23 1100) Pulse Rate:  [63-78] 65 (03/23 1100) Resp:  [16-21] 17 (03/23 1100) BP: (97-131)/(56-77) 100/60 (03/23 1100) SpO2:  [99 %-100 %] 99 % (03/23 1100) Weight:  [115.9 kg] 115.9 kg (03/22 1824)  Intake/Output from previous day: 03/22 0701 - 03/23 0700 In: 521.1 [P.O.:240; I.V.:70.5; IV Piggyback:210.6] Out: 3050 [Urine:3050] Intake/Output this shift: No intake/output data recorded.  Physical Exam Vitals and nursing note reviewed.  Constitutional:      General: He is not in acute distress.    Appearance: He is not ill-appearing, toxic-appearing or diaphoretic.  HENT:     Head: Normocephalic and atraumatic.  Pulmonary:     Effort: Pulmonary effort is normal. No respiratory distress.  Abdominal:     Palpations: Abdomen is soft.      Tenderness: There is no abdominal tenderness.  Skin:    General: Skin is warm and dry.  Neurological:     Mental Status: He is alert. Mental status is at baseline.  Psychiatric:        Mood and Affect: Mood normal.        Behavior: Behavior normal.     Lab Results:  Recent Labs    09/20/22 0438 09/21/22 0431  WBC 6.5 5.8  HGB 8.1* 8.6*  HCT 26.4* 27.3*  PLT 201 215    BMET Recent Labs    09/20/22 0438 09/21/22 0431  NA 135 133*  K 5.3* 4.9  CL 104 107  CO2 19* 19*  GLUCOSE 130* 129*  BUN 43* 53*  CREATININE 2.79* 3.01*  CALCIUM 8.9 8.3*    Studies/Results: US RENAL  Result Date: 09/20/2022 CLINICAL DATA:  Right hydronephrosis EXAM: RENAL / URINARY TRACT ULTRASOUND COMPLETE COMPARISON:  Previous studies including the CT done on 2022-10-11 FINDINGS: Right Kidney: Renal measurements: 9.9 by 6.3 x 5.5 cm = volume: 181.4 mL. There is mild to moderate right hydronephrosis. There is slightly increased cortical echogenicity. There is no perinephric fluid collection. Left Kidney: Renal measurements: 10.1 x 5.2 x 4.5 cm = volume: 123.2 mL. There is no hydronephrosis. There is slightly increased cortical echogenicity. Bladder: Foley catheter is seen in the bladder. Bladder is not distended limiting evaluation. Other: None. IMPRESSION: Mild to moderate right hydronephrosis. There is no hydronephrosis in the left kidney. Electronically Signed   By: Elmer Picker M.D.   On: 09/20/2022 17:52    Assessment & Plan: 67 year old male with PMH neurogenic bladder secondary to TBI managed with chronic indwelling Foley catheter, recurrent UTI, and urethral stricture currently admitted with metabolic derangements, AKI, and gross hematuria with admission imaging notable for a malpositioned Foley catheter, now replaced, possible bladder base mass, and progressive right hydronephrosis.  There is persistent hydro but it may have improved some with foley replacement and bladder decompression  but Cr is up a bit more.   At this point, if the Cr hasn't started to decline overnight, I would recommend proceeding with the nephrostomy tube placement.    Gross hematuria has resolved after Foley catheter replacement yesterday.  The possible mass at this bladder base is more to the left than right and wasn't clearly seen  on [place].  This lesion was higher density on CT and could have just been a clot associated with the urethral lesion but cystoscopy should be considered at follow up.      Irine Seal, MD 09/21/2022 Patient ID: [name], male   DOB: 11/11/1955, 67 y.o.   MRN: [id number]

## 2022-09-21 NOTE — Progress Notes (Signed)
Physical Therapy Treatment Patient Details Name: [name] MRN: [id number] DOB: [birth year] Today's Date: 09/21/2022   History of Present Illness [name] is a 67 y.o. African-American male with medical history significant for bipolar disorder, type 2 diabetes mellitus without complications, hypertension, TBI, and neurogenic bladder with chronic indwelling Foley cath, who presented to the emergency room with acute onset of hematuria.    PT Comments    Pt ready for session despite being hesitant and voicing some fear of movement.  "You won't let me fall will you?"  He is able to transition to EOB with heavy assist +2  Is given time to try on his own but is only able to move his legs slightly towards EOB.  Once sitting he needs cues to flex at hips and knees for balance but is able to maintain without assist once positioned.  He is able to stand x 3 at EOB and on 2 attempts he is able to sidestep left and right for about 3 step..  Stands up to 1 minute max.  Remains flexed at hips with cues to stand up fully but is limited.  Fatigued with effort but does seem pleased with himself for trying today.  X-ray tech in for imaging so pt is returned to supine with max a x 2.     Recommendations for follow up therapy are one component of a multi-disciplinary discharge planning process, led by the attending physician.  Recommendations may be updated based on patient status, additional functional criteria and insurance authorization.  Follow Up Recommendations  Skilled nursing-short term rehab (<3 hours/day)     Assistance Recommended at Discharge Frequent or constant Supervision/Assistance  Patient can return home with the following Two people to help with walking and/or transfers;A lot of help with bathing/dressing/bathroom;Direct supervision/assist for medications management;Assist for transportation   Equipment Recommendations  None recommended by PT    Recommendations for  Other Services       Precautions / Restrictions Precautions Precautions: Fall Restrictions Weight Bearing Restrictions: No     Mobility  Bed Mobility Overal bed mobility: Needs Assistance Bed Mobility: Supine to Sit, Sit to Supine     Supine to sit: Mod assist, Max assist, +2 for physical assistance Sit to supine: Max assist, +2 for physical assistance   General bed mobility comments: does initiate but unable to do without extensive assist.  Very still with encuoragement needed to flex at hips and knees in sitting    Transfers Overall transfer level: Needs assistance Equipment used: Rollator (4 wheels) Transfers: Sit to/from Stand Sit to Stand: Min assist, Mod assist, +2 physical assistance           General transfer comment: fully upright today on 3 attempts but remains flexed at hips.  incresed time but he does put in good effort to do on his own    Ambulation/Gait Ambulation/Gait assistance: Min assist, +2 physical assistance Gait Distance (Feet): 2 Feet Assistive device: Rolling walker (2 wheels) Gait Pattern/deviations: Step-to pattern Gait velocity: dec     General Gait Details: sidesteps along EOB with good clearance and no buckling   Stairs             Wheelchair Mobility    Modified Rankin (Stroke Patients Only)       Balance Overall balance assessment: Needs assistance Sitting-balance support: Feet unsupported, Bilateral upper extremity supported Sitting balance-Leahy Scale: Fair     Standing balance support: Bilateral upper extremity supported, Reliant on assistive device for balance Standing balance-Leahy  Scale: Poor                              Cognition Arousal/Alertness: Awake/alert Behavior During Therapy: WFL for tasks assessed/performed, Flat affect Overall Cognitive Status: History of cognitive impairments - at baseline                                          Exercises      General  Comments        Pertinent Vitals/Pain Pain Assessment Pain Assessment: No/denies pain    Home Living                          Prior Function            PT Goals (current goals can now be found in the care plan section) Progress towards PT goals: Progressing toward goals    Frequency    Min 2X/week      PT Plan Current plan remains appropriate    Co-evaluation              AM-PAC PT "6 Clicks" Mobility   Outcome Measure  Help needed turning from your back to your side while in a flat bed without using bedrails?: Total Help needed moving from lying on your back to sitting on the side of a flat bed without using bedrails?: Total Help needed moving to and from a bed to a chair (including a wheelchair)?: A Lot Help needed standing up from a chair using your arms (e.g., wheelchair or bedside chair)?: A Lot Help needed to walk in hospital room?: A Lot Help needed climbing 3-5 steps with a railing? : Total 6 Click Score: 9    End of Session Equipment Utilized During Treatment: Gait belt Activity Tolerance: Patient tolerated treatment well Patient left: in bed;with call bell/phone within reach;with bed alarm set Nurse Communication: Mobility status PT Visit Diagnosis: Other abnormalities of gait and mobility (R26.89);Muscle weakness (generalized) (M62.81)     Time: [id number] PT Time Calculation (min) (ACUTE ONLY): 21 min  Charges:  $Therapeutic Activity: 8-22 mins                   Chesley Noon, PTA 09/21/22, 12:22 PM

## 2022-09-21 NOTE — Progress Notes (Signed)
[facility]  ROUNDING NOTE   Subjective:   Patient seen lying in bed Alert Remains on room air Wanting to know when the placement will be for the nephrostomy tube     Objective:  Vital signs in last 24 hours:  Temp:  [97.7 F (36.5 C)-98.7 F (37.1 C)] 98.5 F (36.9 C) (03/23 1100) Pulse Rate:  [63-78] 65 (03/23 1100) Resp:  [16-21] 17 (03/23 1100) BP: (97-131)/(56-77) 100/60 (03/23 1100) SpO2:  [99 %-100 %] 99 % (03/23 1100) Weight:  [115.9 kg] 115.9 kg (03/22 1824)  Weight change:  Filed Weights   09/18/22 2309 09/20/22 1824  Weight: 128.7 kg 115.9 kg    Intake/Output: I/O last 3 completed shifts: In: 521.1 [P.O.:240; I.V.:70.5; IV Piggyback:210.6] Out: 5050 [Urine:5050]   Intake/Output this shift:  No intake/output data recorded.  Physical Exam: General: NAD  Head: Normocephalic, atraumatic. Moist oral mucosal membranes  Eyes: Anicteric  Lungs:  Diminished bases  Heart: Regular rate and rhythm  Abdomen:  Soft, nontender, distended  Extremities:  Trace peripheral edema.  Neurologic: Alert and oriented, moving all four extremities  Skin: No lesions  Access: None    Basic Metabolic Panel: Recent Labs  Lab 09/18/22 2347 09/19/22 0039 09/19/22 0527 09/20/22 0438 09/21/22 0431  NA 125* 129* 127* 135 133*  K 7.1* 5.7* 5.5* 5.3* 4.9  CL 104 103 107 104 107  CO2 16* 18* 18* 19* 19*  GLUCOSE 155* 145* 140* 130* 129*  BUN 45* 44* 45* 43* 53*  CREATININE 2.94* 2.69* 2.68* 2.79* 3.01*  CALCIUM 8.0* 7.9* 7.9* 8.9 8.3*  MG  --   --   --  1.9 1.9  PHOS  --   --   --  3.7 4.5     Liver Function Tests: No results for input(s): "AST", "ALT", "ALKPHOS", "BILITOT", "PROT", "ALBUMIN" in the last 168 hours. No results for input(s): "LIPASE", "AMYLASE" in the last 168 hours. No results for input(s): "AMMONIA" in the last 168 hours.  CBC: Recent Labs  Lab 09/18/22 2347 09/19/22 0527 09/19/22 0813 09/19/22 1918 09/20/22 0438 09/21/22 0431   WBC 9.5 10.1  --   --  6.5 5.8  NEUTROABS 6.9  --   --   --   --   --   HGB 8.8* 8.2* 8.0* 8.2* 8.1* 8.6*  HCT 28.5* 26.4* 26.2* 26.8* 26.4* 27.3*  MCV 83.6 83.5  --   --  83.8 82.2  PLT 214 193  --   --  201 215     Cardiac Enzymes: No results for input(s): "CKTOTAL", "CKMB", "CKMBINDEX", "TROPONINI" in the last 168 hours.  BNP: Invalid input(s): "POCBNP"  CBG: Recent Labs  Lab 09/20/22 0723 09/20/22 1114 09/20/22 1620 09/20/22 2053 09/21/22 0811  GLUCAP 108* 100* 132* 153* 136*     Microbiology: Results for orders placed or performed during the hospital encounter of 09/18/22  Urine Culture     Status: None   Collection Time: 09/19/22  3:01 AM   Specimen: Urine, Random  Result Value Ref Range Status   Specimen Description   Final    URINE, RANDOM Performed at [facility], [address], [place], [place] [address]    Special Requests   Final    NONE Reflexed from [id number] Performed at [facility], [address] [place], [place] [address]    Culture   Final    NO GROWTH Performed at [facility], [place] [address], [place], [place] [address]  Report Status 09/20/2022 FINAL  Final    Coagulation Studies: No results for input(s): "LABPROT", "INR" in the last 72 hours.  Urinalysis: Recent Labs    10/05/2022 0301  COLORURINE YELLOW*  LABSPEC 1.015  PHURINE 6.0  GLUCOSEU NEGATIVE  HGBUR LARGE*  BILIRUBINUR NEGATIVE  KETONESUR NEGATIVE  PROTEINUR 100*  NITRITE NEGATIVE  LEUKOCYTESUR TRACE*       Imaging: US RENAL  Result Date: 09/20/2022 CLINICAL DATA:  Right hydronephrosis EXAM: RENAL / URINARY TRACT ULTRASOUND COMPLETE COMPARISON:  Previous studies including the CT done on 2022-10-05 FINDINGS: Right Kidney: Renal measurements: 9.9 by 6.3 x 5.5 cm = volume: 181.4 mL. There is mild to moderate right hydronephrosis. There is slightly increased cortical echogenicity. There is no perinephric fluid collection. Left Kidney: Renal  measurements: 10.1 x 5.2 x 4.5 cm = volume: 123.2 mL. There is no hydronephrosis. There is slightly increased cortical echogenicity. Bladder: Foley catheter is seen in the bladder. Bladder is not distended limiting evaluation. Other: None. IMPRESSION: Mild to moderate right hydronephrosis. There is no hydronephrosis in the left kidney. Electronically Signed   By: Elmer Picker M.D.   On: 09/20/2022 17:52     Medications:    cefTRIAXone (ROCEPHIN)  IV 1 g (09/21/22 1020)   sodium chloride irrigation Stopped (10-05-2022 0340)    Chlorhexidine Gluconate Cloth  6 each Topical Daily   cholecalciferol  1,000 Units Oral Daily   cyanocobalamin  3,000 mcg Oral Daily   insulin aspart  0-5 Units Subcutaneous QHS   insulin aspart  0-9 Units Subcutaneous TID WC   memantine  10 mg Oral BID   mupirocin ointment   Topical Daily   omega-3 acid ethyl esters  1 capsule Oral Daily   Oxcarbazepine  300 mg Oral BID   rosuvastatin  20 mg Oral Daily   sodium bicarbonate  1,300 mg Oral TID   acetaminophen **OR** acetaminophen, magnesium hydroxide, ondansetron **OR** ondansetron (ZOFRAN) IV, QUEtiapine, traZODone  Assessment/ Plan:  Mr. Kenderrick Musarra is a 67 y.o.  male with frontotemporal dementia, bipolar disorder, traumatic brain injury, PE/DVT, diabetes mellitus type II, hypertension, neurogenic bladder who was admitted to [facility] on 09/18/2022 for Hyperkalemia [E87.5] Bladder mass [N32.89] AKI (acute kidney injury) ([place]) [N17.9] Hematuria [R31.9] Hematuria, unspecified type [R31.9] Pressure injury of skin of sacral region, unspecified injury stage [L89.159]   Acute kidney injury on chronic kidney disease stage IIIB: baseline creatinine of 1.72, GFR of 43 on 03/22/22. Secondary to obstructive uropathy and possibly acute cardiorenal syndrome secondary to acute exacerbation of congestive heart failure.  Creatinine slightly elevated today, UOP 3.0L. Continue to hold Bactrim and olmasartan  Lab  Results  Component Value Date   CREATININE 3.01 (H) 09/21/2022   CREATININE 2.79 (H) 09/20/2022   CREATININE 2.68 (H) 2022/10/05    Intake/Output Summary (Last 24 hours) at 09/21/2022 1119 Last data filed at 09/21/2022 0300 Gross per 24 hour  Intake 521.13 ml  Output 1950 ml  Net -1428.87 ml    Hyponatremia: hypervolemic. Secondary to congestive heart failure.  Sodium 133   Hyperkalemia: with acute kidney injury and chronic Bactrim use.  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 Foley catheter was replaced Urology consulted and is concerned about bladder base mass that may be obstructing bladder and persistent hydronephrosis. Urology planning PCN placement. Holding eliquis  for 48 hour prior to placement    Acute exacerbation of chronic diastolic congestive heart failure. Continue Furosemide drip    LOS: 2 Deane Wattenbarger P Aanya Haynes 3/23/202411:19 AM

## 2022-09-21 NOTE — Progress Notes (Signed)
Daily Progress Note   Patient Name: [name]       Date: 09/21/2022 DOB: 1956-05-09  Age: 67 y.o. MRN#: [id number] Attending Physician: Sharen Hones, MD Primary Care Physician: [name], MD Admit Date: 09/18/2022  Reason for Consultation/Follow-up: Establishing goals of care  HPI/Brief Hospital Review: 67 y.o. 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    Patient is being treated for hyponatremia, hyperkalemia, and hydronephrosis.  CT revealed a bladder mass for which follow-up cystoscopy is recommended. Urology consulted. He is an established patient with [facility] in [place].     Of note, patient had 7 hospitalizations in 2023.    PMT was consulted to discuss goals of care.   Subjective: Extensive chart review has been completed prior to meeting patient including labs, vital signs, imaging, progress notes, orders, and available advanced directive documents from current and previous encounters.    Visited with [name] at his bedside. No family present during time of visit. Reports an uneventful evening and that he rested well. Denies acute pain or discomfort. Able to share that a few providers have been by today but unable to recall their plan. Shared that results of renal US revealed mild-moderate hydronephrosis (possible improvement) but an increase in creatinine levels. Plan is to repeat labs in AM and continue to plan for possible nephrostomy  tube placement based on results. Continue to recommend outpatient cystoscopy for possible bladder mass (possible clot). [name] verbalized his understanding and remains hopeful for a full and meaningful recovery. Shares he enjoys his life even though he is not able to be active. Finds joy in his relationship his has with his wife, grandchildren and his dog.  Requested I call [name] and relay medical updates. Spoke with [name] and reviewed above updates. She also remains hopeful in a full recovery without need further procedures or interventions. Shares she is eager for her husband to return home.  PMT to continue to follow for ongoing needs and support.  Objective:  Physical Exam Constitutional:      General: He is not in acute  distress.    Appearance: He is obese. He is ill-appearing.  Pulmonary:     Effort: Pulmonary effort is normal. No respiratory distress.  Skin:    General: Skin is warm and dry.  Neurological:     Mental Status: He is alert.     Motor: Weakness present.             Vital Signs: BP 93/61   Pulse 63   Temp 98.5 F (36.9 C)   Resp 16   Wt 115.9 kg   SpO2 100%   BMI 41.24 kg/m  SpO2: SpO2: 100 % O2 Device: O2 Device: Room Air O2 Flow Rate:     Palliative Care Assessment & Plan   Assessment/Recommendation/Plan  Full Code-Full Scope Symptom burden remains low Encourage ongoing [place] conversations PMT to continue to follow for ongoing needs and support  Thank you for allowing the Palliative Medicine Team to assist in the care of this patient.  Total time:  35 minutes  Time spent includes: Detailed review of medical records (labs, imaging, vital signs), medically appropriate exam (mental status, respiratory, cardiac, skin), discussed with treatment team, counseling and educating patient, family and staff, documenting clinical information, medication management and coordination of care.  Theodoro Grist, DNP, AGNP-C Palliative  Medicine   Please contact Palliative Medicine Team phone at [phone] for questions and concerns.

## 2022-09-21 NOTE — Progress Notes (Signed)
Progress Note   [name] [id number] DOB: [birth year] DOA: 09/18/2022     2 DOS: the patient was seen and examined on 09/21/2022   Brief hospital course: Amahri Bongo is a 67 y.o. African-American male with medical history significant for bipolar disorder, type 2 diabetes mellitus without complications, hypertension, TBI, and neurogenic bladder with chronic indwelling Foley cath, who presented to the emergency room with acute onset of hematuria.  Labs revealed hyponatremia of 129 and potassium of 7.1, CO2 16, creatinine 2.69. UA showed 11-20 WBCs with more than 50 RBCs and trace leukocytes. CT scan showed moderate right hydronephrosis, increased since the prior CT. A 3.8 x 2.3 cm mass in the base of the bladder.  Consult from urology and nephrology was obtained.  Potassium normalized after giving fluids.  Patient is pending nephrostomy tube placement. Patient is also diagnosed with acute on chronic diastolic congestive heart failure, nephrology has started Lasix drip.   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   right hydronephrosis. Patient hematuria is better, discussed with nephrology, patient will need right nephrostomy.  Last dose of Eliquis was 2 AM t3/22. Waiting for 48 hours  before procedure can be performed.  Patient followed by urology.   Acute kidney injury superimposed on chronic kidney disease ([place]) secondary to obstruction. Hyperkalemia secondary to acute renal failure. Hyponatremia Metabolic acidosis. Patient is on Lasix drip for acute exacerbation of congestive heart failure.  Renal function getting worse partially due to diuresing, partially due to hydronephrosis.  Discussed with nephrology, patient may be able to take off of the Lasix. Increase sodium bicarbonate for metabolic acidosis. Potassium have normalized.  Acute on chronic diastolic congestive heart failure.  POA. Reviewed previous echocardiogram performed a year ago, ejection fraction normal, grade 3 diastolic dysfunction.  Currently patient is on Lasix drip per nephrology, may be able to take off as patient volume status much better.   Sacral decubitus ulcer Wound care consulted   Dyslipidemia Essential hypertension Resume home treatment.   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   Type 2 diabetes mellitus without complications ([place]) - She will be placed on supplemental coverage with NovoLog.   History of DVT. Hold off anticoagulation for surgery     Subjective:  Patient doing well today, no shortness of breath.  No abdominal pain.  Physical Exam: Vitals:   09/20/22 2346 09/21/22 0345 09/21/22 0749 09/21/22 1100  BP: 105/62 112/62 97/60 100/60  Pulse: 78 66 67 65  Resp: 19 20 16 17   Temp: 98.1 F (36.7 C) 98.1 F (36.7 C) 98.3 F (36.8 C) 98.5 F (36.9 C)  TempSrc: Oral Oral Oral   SpO2: 100% 100% 100% 99%  Weight:       General exam: Appears calm and comfortable  Respiratory system: Clear to auscultation. Respiratory effort normal. Cardiovascular system: S1 & S2 heard, RRR. No JVD, murmurs, rubs, gallops or clicks. No pedal edema. Gastrointestinal system: Abdomen is  nondistended, soft and nontender. No organomegaly or masses felt. Normal bowel sounds heard. Central nervous system: Alert and oriented. No focal neurological deficits. Extremities: Symmetric 5 x 5 power. Skin: No rashes, lesions or ulcers Psychiatry: Judgement and insight appear normal. Mood & affect appropriate.    Data Reviewed:  Lab results reviewed.  Family Communication: None  Disposition: Status is: Inpatient Remains inpatient appropriate because: Severity of disease, IV treatment.     Time spent: 50 minutes  Author: Sharen Hones, MD 09/21/2022 11:11 AM  For on call review [email or web address].

## 2022-09-22 DIAGNOSIS — N179 Acute kidney failure, unspecified: Secondary | ICD-10-CM | POA: Diagnosis not present

## 2022-09-22 DIAGNOSIS — I5033 Acute on chronic diastolic (congestive) heart failure: Secondary | ICD-10-CM | POA: Diagnosis not present

## 2022-09-22 DIAGNOSIS — R310 Gross hematuria: Secondary | ICD-10-CM | POA: Insufficient documentation

## 2022-09-22 DIAGNOSIS — N189 Chronic kidney disease, unspecified: Secondary | ICD-10-CM | POA: Diagnosis not present

## 2022-09-22 LAB — PARATHYROID HORMONE, INTACT (NO CA): PTH: 33 pg/mL (ref 15–65)

## 2022-09-22 LAB — CBC
HCT: 25.6 % — ABNORMAL LOW (ref 39.0–52.0)
Hemoglobin: 8 g/dL — ABNORMAL LOW (ref 13.0–17.0)
MCH: 26.3 pg (ref 26.0–34.0)
MCHC: 31.3 g/dL (ref 30.0–36.0)
MCV: 84.2 fL (ref 80.0–100.0)
Platelets: 194 10*3/uL (ref 150–400)
RBC: 3.04 MIL/uL — ABNORMAL LOW (ref 4.22–5.81)
RDW: 18 % — ABNORMAL HIGH (ref 11.5–15.5)
WBC: 6.2 10*3/uL (ref 4.0–10.5)
nRBC: 0 % (ref 0.0–0.2)

## 2022-09-22 LAB — BASIC METABOLIC PANEL
Anion gap: 5 (ref 5–15)
BUN: 58 mg/dL — ABNORMAL HIGH (ref 8–23)
CO2: 19 mmol/L — ABNORMAL LOW (ref 22–32)
Calcium: 8.1 mg/dL — ABNORMAL LOW (ref 8.9–10.3)
Chloride: 109 mmol/L (ref 98–111)
Creatinine, Ser: 2.81 mg/dL — ABNORMAL HIGH (ref 0.61–1.24)
GFR, Estimated: 24 mL/min — ABNORMAL LOW (ref >60)
Glucose, Bld: 141 mg/dL — ABNORMAL HIGH (ref 70–99)
Potassium: 4.6 mmol/L (ref 3.5–5.1)
Sodium: 133 mmol/L — ABNORMAL LOW (ref 135–145)

## 2022-09-22 LAB — GLUCOSE, CAPILLARY
Glucose-Capillary: 127 mg/dL — ABNORMAL HIGH (ref 70–99)
Glucose-Capillary: 111 mg/dL — ABNORMAL HIGH (ref 70–99)
Glucose-Capillary: 171 mg/dL — ABNORMAL HIGH (ref 70–99)
Glucose-Capillary: 114 mg/dL — ABNORMAL HIGH (ref 70–99)

## 2022-09-22 LAB — MAGNESIUM: Magnesium: 2 mg/dL (ref 1.7–2.4)

## 2022-09-22 LAB — PHOSPHORUS: Phosphorus: 4.8 mg/dL — ABNORMAL HIGH (ref 2.5–4.6)

## 2022-09-22 MED ORDER — ORAL CARE MOUTH RINSE: 15.0000 mL | OROMUCOSAL | Status: DC | PRN

## 2022-09-22 NOTE — Progress Notes (Signed)
Urology Inpatient Progress Note  Subjective: No acute events overnight.  He is afebrile, VSS. Hemoglobin is 8.0.  Creatinine down some to 2.81. He has no pain.   The Renal US suggested less hydro than the CT.  Foley catheter in place draining clear, yellow urine. Patient denies pain.    Last Eliquis administered 09/18/20 at 2226.  Anti-infectives: Anti-infectives (From admission, onward)    Start     Dose/Rate Route Frequency Ordered Stop   09/19/22 1100  cefTRIAXone (ROCEPHIN) 1 g in sodium chloride 0.9 % 100 mL IVPB        1 g 200 mL/hr over 30 Minutes Intravenous Every 24 hours 09/19/22 1058         Current Facility-Administered Medications  Medication Dose Route Frequency Provider Last Rate Last Admin   acetaminophen (TYLENOL) tablet 650 mg  650 mg Oral Q6H PRN Mansy, Jan A, MD       Or   acetaminophen (TYLENOL) suppository 650 mg  650 mg Rectal Q6H PRN Mansy, Jan A, MD       cefTRIAXone (ROCEPHIN) 1 g in sodium chloride 0.9 % 100 mL IVPB  1 g Intravenous Q24H Val Riles, MD 200 mL/hr at 09/21/22 1020 1 g at 09/21/22 1020   Chlorhexidine Gluconate Cloth 2 % PADS 6 each  6 each Topical Daily Sharen Hones, MD   6 each at 09/21/22 0933   cholecalciferol (VITAMIN D3) 25 MCG (1000 UNIT) tablet 1,000 Units  1,000 Units Oral Daily Mansy, Jan A, MD   1,000 Units at 09/21/22 0932   cyanocobalamin (VITAMIN B12) tablet 3,000 mcg  3,000 mcg Oral Daily Mansy, Jan A, MD   3,000 mcg at 09/21/22 0933   insulin aspart (novoLOG) injection 0-5 Units  0-5 Units Subcutaneous QHS Mansy, Jan A, MD       insulin aspart (novoLOG) injection 0-9 Units  0-9 Units Subcutaneous TID WC Mansy, Jan A, MD   1 Units at 09/21/22 1534   magnesium hydroxide (MILK OF MAGNESIA) suspension 30 mL  30 mL Oral Daily PRN Mansy, Jan A, MD       memantine [facility]) tablet 10 mg  10 mg Oral BID Mansy, Jan A, MD   10 mg at 09/21/22 2201   mupirocin ointment (BACTROBAN) 2 %   Topical Daily Val Riles, MD   Given at  09/21/22 1021   omega-3 acid ethyl esters (LOVAZA) capsule 1 g  1 capsule Oral Daily Mansy, Jan A, MD   1 g at 09/21/22 0934   ondansetron (ZOFRAN) tablet 4 mg  4 mg Oral Q6H PRN Mansy, Jan A, MD       Or   ondansetron [facility]) injection 4 mg  4 mg Intravenous Q6H PRN Mansy, Jan A, MD       Oxcarbazepine (TRILEPTAL) tablet 300 mg  300 mg Oral BID Mansy, Jan A, MD   300 mg at 09/21/22 2201   QUEtiapine (SEROQUEL) tablet 50 mg  50 mg Oral Daily PRN Mansy, Jan A, MD       rosuvastatin (CRESTOR) tablet 20 mg  20 mg Oral Daily Mansy, Jan A, MD   20 mg at 09/21/22 [id number]   sodium chloride irrigation 0.9 % 3,000 mL  3,000 mL Irrigation Continuous Paulette Blanch, MD   Stopped at 09/19/22 0340   traZODone (DESYREL) tablet 25 mg  25 mg Oral QHS PRN Mansy, Jan A, MD       Objective: Vital signs in last 24 hours: Temp:  [97.4  F (36.3 C)-98.5 F (36.9 C)] 97.9 F (36.6 C) (03/24 0803) Pulse Rate:  [53-65] 53 (03/24 0803) Resp:  [16-17] 16 (03/24 0803) BP: (93-150)/(37-69) 150/69 (03/24 0803) SpO2:  [99 %-100 %] 100 % (03/24 0803)  Intake/Output from previous day: 03/23 0701 - 03/24 0700 In: -  Out: 1550 [Urine:1550] Intake/Output this shift: No intake/output data recorded.  Physical Exam Vitals reviewed.  Constitutional:      Appearance: Normal appearance.  Neurological:     Mental Status: He is alert.     Lab Results:  Recent Labs    09/21/22 0431 09/22/22 0445  WBC 5.8 6.2  HGB 8.6* 8.0*  HCT 27.3* 25.6*  PLT 215 194    BMET Recent Labs    09/21/22 0431 09/22/22 0445  NA 133* 133*  K 4.9 4.6  CL 107 109  CO2 19* 19*  GLUCOSE 129* 141*  BUN 53* 58*  CREATININE 3.01* 2.81*  CALCIUM 8.3* 8.1*    Studies/Results: DG Chest Port 1 View  Result Date: 09/21/2022 CLINICAL DATA:  Acute on chronic congestive heart failure EXAM: PORTABLE CHEST 1 VIEW COMPARISON:  03/20/2022 FINDINGS: Frontal view of the chest demonstrates an unremarkable cardiac silhouette. No airspace  disease, effusion, or pneumothorax. No acute bony abnormality. IMPRESSION: 1. No acute intrathoracic process. Electronically Signed   By: Randa Ngo M.D.   On: 09/21/2022 11:58   US RENAL  Result Date: 09/20/2022 CLINICAL DATA:  Right hydronephrosis EXAM: RENAL / URINARY TRACT ULTRASOUND COMPLETE COMPARISON:  Previous studies including the CT done on 18-Oct-2022 FINDINGS: Right Kidney: Renal measurements: 9.9 by 6.3 x 5.5 cm = volume: 181.4 mL. There is mild to moderate right hydronephrosis. There is slightly increased cortical echogenicity. There is no perinephric fluid collection. Left Kidney: Renal measurements: 10.1 x 5.2 x 4.5 cm = volume: 123.2 mL. There is no hydronephrosis. There is slightly increased cortical echogenicity. Bladder: Foley catheter is seen in the bladder. Bladder is not distended limiting evaluation. Other: None. IMPRESSION: Mild to moderate right hydronephrosis. There is no hydronephrosis in the left kidney. Electronically Signed   By: Elmer Picker M.D.   On: 09/20/2022 17:52    Assessment & Plan: 67 year old male with PMH neurogenic bladder secondary to TBI managed with chronic indwelling Foley catheter, recurrent UTI, and urethral stricture currently admitted with metabolic derangements, AKI, and gross hematuria with admission imaging notable for a malpositioned Foley catheter, now replaced, possible bladder base mass, and progressive right hydronephrosis.  There is persistent hydro but it may have improved some with foley replacement and bladder decompression.  His Cr has started to fall and with the clear urine and lack of symptoms, I think we can hold off of the nephrostomy tube.  Since he is off of the Eliquis and has a possible bladder mass vs clot, it might be best to consider cystoscopy with possible TURBT with RTG and stent in the OR sometime this week.  Another option would be to consider a repeat CT to see if the mass persists as it could be a clot as there was  nothing in that area on CT in 7/23.        Irine Seal, MD 09/22/2022 Patient ID: [name], male   DOB: Jul 20, 1955, 67 y.o.   MRN: [id number] Patient ID: [name], male   DOB: 09-05-1955, 67 y.o.   MRN: [id number]

## 2022-09-22 NOTE — Progress Notes (Signed)
Progress Note   [name] [id number] DOB: [birth year] DOA: 09/18/2022     3 DOS: the patient was seen and examined on 09/22/2022   Brief hospital course: [name] is a 67 y.o. African-American male with medical history significant for bipolar disorder, type 2 diabetes mellitus without complications, hypertension, TBI, and neurogenic bladder with chronic indwelling Foley cath, who presented to the emergency room with acute onset of hematuria.  Labs revealed hyponatremia of 129 and potassium of 7.1, CO2 16, creatinine 2.69. UA showed 11-20 WBCs with more than 50 RBCs and trace leukocytes. CT scan showed moderate right hydronephrosis, increased since the prior CT. A 3.8 x 2.3 cm mass in the base of the bladder.  Consult from urology and nephrology was obtained.  Potassium normalized after giving fluids.   Patient is also diagnosed with acute on chronic diastolic congestive heart failure, nephrology has started Lasix drip which was completed on 3/23.   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   right hydronephrosis. Urine has cleared up.  Renal function is also getting better after discontinuation of IV Lasix drip, discussed with urology, they want to hold off nephrostomy.  Continue to  monitor renal function. Urology will follow outpatient as outpatient for cystoscopy.   Acute kidney injury superimposed on chronic kidney disease ([place]) secondary to obstruction. Hyperkalemia secondary to acute renal failure. Hyponatremia Metabolic acidosis. Conditions are improving.  But still has significant metabolic acidosis, sodium bicarbonate dose increased.   Acute on chronic diastolic congestive heart failure.  POA. Reviewed previous echocardiogram performed a year ago, ejection fraction normal, grade 3 diastolic dysfunction.  Volume status much improved, Lasix discontinued yesterday.   Sacral decubitus ulcer Wound care consulted   Dyslipidemia Essential hypertension Resume home treatment.   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   Type 2 diabetes mellitus without complications ([place]) - She will be placed on supplemental coverage with NovoLog.   History of DVT. Continue to hold anticoagulation until decision is made about surgery.  Morbid obesity with BMI 41.24. Diet and exercise.    Subjective:  Patient doing well today, still weak, but no shortness of breath.  Physical Exam: Vitals:   09/21/22 2343 09/22/22 0307 09/22/22 0803 09/22/22 1200  BP: (!) 98/57 (!) 93/51 (!) 150/69 125/72  Pulse: (!) 57 (!) 57 (!) 53 (!) 58  Resp:   16 16  Temp: (!) 97.4 F (36.3 C) 98 F (36.7 C) 97.9 F (36.6 C) 97.8 F (36.6 C)  TempSrc:    Oral  SpO2: 100% 100% 100% 93%  Weight:       General exam: Appears calm and comfortable  Respiratory system: Clear to auscultation. Respiratory effort normal. Cardiovascular system: S1 & S2 heard, RRR. No JVD, murmurs, rubs, gallops or clicks.  No pedal edema. Gastrointestinal system: Abdomen is nondistended, soft and nontender. No organomegaly or masses felt. Normal bowel sounds heard. Central nervous system: Alert and oriented. No focal  neurological deficits. Extremities: Symmetric 5 x 5 power. Skin: No rashes, lesions or ulcers Psychiatry: Judgement and insight appear normal. Mood & affect appropriate.    Data Reviewed:  Review lab results.  Family Communication: Not able to reach wife  Disposition: Status is: Inpatient Remains inpatient appropriate because: Severity of disease, unsafe discharge. Patient need nursing placement.     Time spent: 35 minutes  Author: Sharen Hones, MD 09/22/2022 12:01 PM  For on call review [email or web address].

## 2022-09-22 NOTE — Progress Notes (Signed)
[facility]  ROUNDING NOTE   Subjective:   Patient seen lying in bed Alert Remains on room air Patient reports that urology is no longer going to place nephrostomy tube.   Objective:  Vital signs in last 24 hours:  Temp:  [97.4 F (36.3 C)-98.5 F (36.9 C)] 97.9 F (36.6 C) (03/24 0803) Pulse Rate:  [53-63] 53 (03/24 0803) Resp:  [16] 16 (03/24 0803) BP: (93-150)/(37-69) 150/69 (03/24 0803) SpO2:  [100 %] 100 % (03/24 0803)  Weight change:  Filed Weights   09/18/22 2309 09/20/22 1824  Weight: 128.7 kg 115.9 kg    Intake/Output: I/O last 3 completed shifts: In: 521.1 [[name].O.:240; I.V.:70.5; IV Piggyback:210.6] Out: 2100 [Urine:2100]   Intake/Output this shift:  No intake/output data recorded.  Physical Exam: General: NAD  Head: Normocephalic, atraumatic. Moist oral mucosal membranes  Eyes: Anicteric  Lungs:  Diminished bases  Heart: Regular rate and rhythm  Abdomen:  Soft, nontender, distended  Extremities:  Trace peripheral edema.  Neurologic: Alert and oriented, moving all four extremities  Skin: No lesions  Access: None    Basic Metabolic Panel: Recent Labs  Lab 09/19/22 0039 09/19/22 0527 09/20/22 0438 09/21/22 0431 09/22/22 0445  NA 129* 127* 135 133* 133*  K 5.7* 5.5* 5.3* 4.9 4.6  CL 103 107 104 107 109  CO2 18* 18* 19* 19* 19*  GLUCOSE 145* 140* 130* 129* 141*  BUN 44* 45* 43* 53* 58*  CREATININE 2.69* 2.68* 2.79* 3.01* 2.81*  CALCIUM 7.9* 7.9* 8.9 8.3* 8.1*  MG  --   --  1.9 1.9 2.0  PHOS  --   --  3.7 4.5 4.8*     Liver Function Tests: No results for input(s): "AST", "ALT", "ALKPHOS", "BILITOT", "PROT", "ALBUMIN" in the last 168 hours. No results for input(s): "LIPASE", "AMYLASE" in the last 168 hours. No results for input(s): "AMMONIA" in the last 168 hours.  CBC: Recent Labs  Lab 09/18/22 2347 09/19/22 0527 09/19/22 0813 09/19/22 1918 09/20/22 0438 09/21/22 0431 09/22/22 0445  WBC 9.5 10.1  --   --  6.5 5.8 6.2   NEUTROABS 6.9  --   --   --   --   --   --   HGB 8.8* 8.2* 8.0* 8.2* 8.1* 8.6* 8.0*  HCT 28.5* 26.4* 26.2* 26.8* 26.4* 27.3* 25.6*  MCV 83.6 83.5  --   --  83.8 82.2 84.2  PLT 214 193  --   --  201 215 194     Cardiac Enzymes: No results for input(s): "CKTOTAL", "CKMB", "CKMBINDEX", "TROPONINI" in the last 168 hours.  BNP: Invalid input(s): "POCBNP"  CBG: Recent Labs  Lab 09/21/22 0811 09/21/22 1234 09/21/22 1526 09/21/22 2053 09/22/22 0805  GLUCAP 136* 157* 146* 156* 127*     Microbiology: Results for orders placed or performed during the hospital encounter of 09/18/22  Urine Culture     Status: None   Collection Time: 09/19/22  3:01 AM   Specimen: Urine, Random  Result Value Ref Range Status   Specimen Description   Final    URINE, RANDOM Performed at [facility], [address], [place], [place] [address]    Special Requests   Final    NONE Reflexed from [id number] Performed at [facility], [address], [place], [place] [address]    Culture   Final    NO GROWTH Performed at [facility], [place] [address], [place], [place] [address]    Report Status 09/20/2022  FINAL  Final    Coagulation Studies: No results for input(s): "LABPROT", "INR" in the last 72 hours.  Urinalysis: No results for input(s): "COLORURINE", "LABSPEC", "PHURINE", "GLUCOSEU", "HGBUR", "BILIRUBINUR", "KETONESUR", "PROTEINUR", "UROBILINOGEN", "NITRITE", "LEUKOCYTESUR" in the last 72 hours.  Invalid input(s): "APPERANCEUR"     Imaging: DG Chest Port 1 View  Result Date: 09/21/2022 CLINICAL DATA:  Acute on chronic congestive heart failure EXAM: PORTABLE CHEST 1 VIEW COMPARISON:  03/20/2022 FINDINGS: Frontal view of the chest demonstrates an unremarkable cardiac silhouette. No airspace disease, effusion, or pneumothorax. No acute bony abnormality. IMPRESSION: 1. No acute intrathoracic process. Electronically Signed   By: Randa Ngo M.D.   On: 09/21/2022 11:58    US RENAL  Result Date: 09/20/2022 CLINICAL DATA:  Right hydronephrosis EXAM: RENAL / URINARY TRACT ULTRASOUND COMPLETE COMPARISON:  Previous studies including the CT done on 2022-09-21 FINDINGS: Right Kidney: Renal measurements: 9.9 by 6.3 x 5.5 cm = volume: 181.4 mL. There is mild to moderate right hydronephrosis. There is slightly increased cortical echogenicity. There is no perinephric fluid collection. Left Kidney: Renal measurements: 10.1 x 5.2 x 4.5 cm = volume: 123.2 mL. There is no hydronephrosis. There is slightly increased cortical echogenicity. Bladder: Foley catheter is seen in the bladder. Bladder is not distended limiting evaluation. Other: None. IMPRESSION: Mild to moderate right hydronephrosis. There is no hydronephrosis in the left kidney. Electronically Signed   By: Elmer Picker M.D.   On: 09/20/2022 17:52     Medications:    cefTRIAXone (ROCEPHIN)  IV 1 g (09/21/22 1020)   sodium chloride irrigation Stopped (09-21-22 0340)    Chlorhexidine Gluconate Cloth  6 each Topical Daily   cholecalciferol  1,000 Units Oral Daily   cyanocobalamin  3,000 mcg Oral Daily   insulin aspart  0-5 Units Subcutaneous QHS   insulin aspart  0-9 Units Subcutaneous TID WC   memantine  10 mg Oral BID   mupirocin ointment   Topical Daily   omega-3 acid ethyl esters  1 capsule Oral Daily   Oxcarbazepine  300 mg Oral BID   rosuvastatin  20 mg Oral Daily   acetaminophen **OR** acetaminophen, magnesium hydroxide, ondansetron **OR** ondansetron (ZOFRAN) IV, mouth rinse, QUEtiapine, traZODone  Assessment/ Plan:  [name] is a 67 y.o.  male with frontotemporal dementia, bipolar disorder, traumatic brain injury, PE/DVT, diabetes mellitus type II, hypertension, neurogenic bladder who was admitted to [facility] on 09/18/2022 for Hyperkalemia [E87.5] Bladder mass [N32.89] AKI (acute kidney injury) ([place]) [N17.9] Hematuria [R31.9] Hematuria, unspecified type [R31.9] Pressure injury of  skin of sacral region, unspecified injury stage [L89.159]   Acute kidney injury on chronic kidney disease stage IIIB: baseline creatinine of 1.72, GFR of 43 on 03/22/22. Secondary to obstructive uropathy and possibly acute cardiorenal syndrome secondary to acute exacerbation of congestive heart failure.  Creatinine slightly decreased from yesterday from 3.01 to 2.81.  Continue to hold Bactrim and olmasartan  Lab Results  Component Value Date   CREATININE 2.81 (H) 09/22/2022   CREATININE 3.01 (H) 09/21/2022   CREATININE 2.79 (H) 09/20/2022    Intake/Output Summary (Last 24 hours) at 09/22/2022 1123 Last data filed at 09/22/2022 0600 Gross per 24 hour  Intake --  Output 1550 ml  Net -1550 ml    Hyponatremia: hypervolemic. Secondary to congestive heart failure.  Sodium 133   Hyperkalemia: with acute kidney injury and chronic Bactrim use.  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 Foley catheter was replaced Urology consulted and is concerned about bladder base mass that may be obstructing bladder and persistent hydronephrosis. Urology no longer  planning PCN placement.    Acute exacerbation of chronic diastolic congestive heart failure. IV lasix discontinued yesterday     LOS: 3 [name] [name] 3/24/202411:23 AM

## 2022-09-22 NOTE — Progress Notes (Signed)
Visited with [name]. Goals remain clear. Answered questions regarding medical updates. No acute palliative needs at this time.  No Charge.  Theodoro Grist, DNP, AGNP-C Palliative Medicine  Please call Palliative Medicine team phone with any questions [phone]. For individual providers please see AMION.

## 2022-09-22 NOTE — TOC Progression Note (Signed)
Transition of Care [facility]) - Progression Note    Patient Details  Name: [name] MRN: [id number] Date of Birth: [birth year]  Transition of Care [facility]) CM/SW [place], [place] Phone Number: 09/22/2022, 4:29 PM  Clinical Narrative:     CSW spoke with pt about PT recommendations, pt states he needs some time to think about it, requested follow up tomorrow.   Expected Discharge Plan: [place] Barriers to Discharge: Continued Medical Work up  Expected Discharge Plan and Services       Living arrangements for the past 2 months: Single Family Home                                       Social Determinants of Health (SDOH) Interventions SDOH Screenings   Food Insecurity: No Food Insecurity (09/19/2022)  Housing: Low Risk  (09/19/2022)  Transportation Needs: No Transportation Needs (09/19/2022)  Utilities: Not At Risk (09/19/2022)  Depression (PHQ2-9): Low Risk  (06/04/2022)  Financial Resource Strain: Low Risk  (09/09/2017)  Physical Activity: Inactive (09/09/2017)  Social Connections: Unknown (09/09/2017)  Stress: No Stress Concern Present (09/09/2017)  Tobacco Use: Low Risk  (09/19/2022)    Readmission Risk Interventions    03/23/2022   12:09 PM 02/23/2022    3:44 PM 01/14/2022    3:23 PM  Readmission Risk Prevention Plan  Transportation Screening Complete Complete Complete  Medication Review Press photographer) Complete  Complete  PCP or Specialist appointment within 3-5 days of discharge Complete Complete   HRI or [place] Complete Complete Complete  SW Recovery Care/Counseling Consult Complete Complete   Palliative Care Screening Not Applicable Not Applicable Not [place] Not Applicable Not Applicable

## 2022-09-23 ENCOUNTER — Other Ambulatory Visit: Payer: Self-pay | Admitting: Physician Assistant

## 2022-09-23 DIAGNOSIS — N1330 Unspecified hydronephrosis: Secondary | ICD-10-CM

## 2022-09-23 DIAGNOSIS — R310 Gross hematuria: Secondary | ICD-10-CM | POA: Diagnosis not present

## 2022-09-23 DIAGNOSIS — I5033 Acute on chronic diastolic (congestive) heart failure: Secondary | ICD-10-CM | POA: Diagnosis not present

## 2022-09-23 DIAGNOSIS — N189 Chronic kidney disease, unspecified: Secondary | ICD-10-CM | POA: Diagnosis not present

## 2022-09-23 DIAGNOSIS — N179 Acute kidney failure, unspecified: Secondary | ICD-10-CM | POA: Diagnosis not present

## 2022-09-23 DIAGNOSIS — Z7189 Other specified counseling: Secondary | ICD-10-CM | POA: Diagnosis not present

## 2022-09-23 DIAGNOSIS — N3289 Other specified disorders of bladder: Secondary | ICD-10-CM | POA: Diagnosis not present

## 2022-09-23 LAB — CBC
HCT: 26.1 % — ABNORMAL LOW (ref 39.0–52.0)
Hemoglobin: 8 g/dL — ABNORMAL LOW (ref 13.0–17.0)
MCH: 25.9 pg — ABNORMAL LOW (ref 26.0–34.0)
MCHC: 30.7 g/dL (ref 30.0–36.0)
MCV: 84.5 fL (ref 80.0–100.0)
Platelets: 187 10*3/uL (ref 150–400)
RBC: 3.09 MIL/uL — ABNORMAL LOW (ref 4.22–5.81)
RDW: 18 % — ABNORMAL HIGH (ref 11.5–15.5)
WBC: 6.1 10*3/uL (ref 4.0–10.5)
nRBC: 0 % (ref 0.0–0.2)

## 2022-09-23 LAB — BASIC METABOLIC PANEL
Anion gap: 9 (ref 5–15)
BUN: 58 mg/dL — ABNORMAL HIGH (ref 8–23)
CO2: 19 mmol/L — ABNORMAL LOW (ref 22–32)
Calcium: 8.2 mg/dL — ABNORMAL LOW (ref 8.9–10.3)
Chloride: 106 mmol/L (ref 98–111)
Creatinine, Ser: 2.58 mg/dL — ABNORMAL HIGH (ref 0.61–1.24)
GFR, Estimated: 26 mL/min — ABNORMAL LOW (ref >60)
Glucose, Bld: 134 mg/dL — ABNORMAL HIGH (ref 70–99)
Potassium: 4.4 mmol/L (ref 3.5–5.1)
Sodium: 134 mmol/L — ABNORMAL LOW (ref 135–145)

## 2022-09-23 LAB — KAPPA/LAMBDA LIGHT CHAINS
Kappa free light chain: 141.3 mg/L — ABNORMAL HIGH (ref 3.3–19.4)
Kappa, lambda light chain ratio: 1.58 (ref 0.26–1.65)
Lambda free light chains: 89.2 mg/L — ABNORMAL HIGH (ref 5.7–26.3)

## 2022-09-23 LAB — GLUCOSE, CAPILLARY
Glucose-Capillary: 120 mg/dL — ABNORMAL HIGH (ref 70–99)
Glucose-Capillary: 136 mg/dL — ABNORMAL HIGH (ref 70–99)
Glucose-Capillary: 151 mg/dL — ABNORMAL HIGH (ref 70–99)
Glucose-Capillary: 137 mg/dL — ABNORMAL HIGH (ref 70–99)

## 2022-09-23 MED ORDER — APIXABAN 5 MG PO TABS
5.0000 mg | ORAL_TABLET | Freq: Two times a day (BID) | ORAL | Status: DC
  Administered 2022-09-23 – 2022-09-25 (×5): 5 mg via ORAL
  Filled 2022-09-23 (×5): qty 1

## 2022-09-23 NOTE — Progress Notes (Signed)
[facility]  ROUNDING NOTE   Subjective:   Patient seen sitting up in bed Alert and oriented Remains on room air Trace lower extremity edema  Objective:  Vital signs in last 24 hours:  Temp:  [97.6 F (36.4 C)-98.9 F (37.2 C)] 97.6 F (36.4 C) (03/25 0827) Pulse Rate:  [58-70] 60 (03/25 0827) Resp:  [15-16] 16 (03/25 0827) BP: (114-149)/(68-80) 149/70 (03/25 0827) SpO2:  [93 %-100 %] 100 % (03/25 0827)  Weight change:  Filed Weights   09/18/22 2309 09/20/22 1824  Weight: 128.7 kg 115.9 kg    Intake/Output: I/O last 3 completed shifts: In: -  Out: 900 [Urine:900]   Intake/Output this shift:  No intake/output data recorded.  Physical Exam: General: NAD  Head: Normocephalic, atraumatic. Moist oral mucosal membranes  Eyes: Anicteric  Lungs:  Diminished bases  Heart: Regular rate and rhythm  Abdomen:  Soft, nontender, distended  Extremities:  Trace peripheral edema.  Neurologic: Alert and oriented, moving all four extremities  Skin: No lesions  Access: None    Basic Metabolic Panel: Recent Labs  Lab 09/19/22 0527 09/20/22 0438 09/21/22 0431 09/22/22 0445 09/23/22 0341  NA 127* 135 133* 133* 134*  K 5.5* 5.3* 4.9 4.6 4.4  CL 107 104 107 109 106  CO2 18* 19* 19* 19* 19*  GLUCOSE 140* 130* 129* 141* 134*  BUN 45* 43* 53* 58* 58*  CREATININE 2.68* 2.79* 3.01* 2.81* 2.58*  CALCIUM 7.9* 8.9 8.3* 8.1* 8.2*  MG  --  1.9 1.9 2.0  --   PHOS  --  3.7 4.5 4.8*  --      Liver Function Tests: No results for input(s): "AST", "ALT", "ALKPHOS", "BILITOT", "PROT", "ALBUMIN" in the last 168 hours. No results for input(s): "LIPASE", "AMYLASE" in the last 168 hours. No results for input(s): "AMMONIA" in the last 168 hours.  CBC: Recent Labs  Lab 09/18/22 2347 09/19/22 0527 09/19/22 0813 09/19/22 1918 09/20/22 0438 09/21/22 0431 09/22/22 0445 09/23/22 0341  WBC 9.5 10.1  --   --  6.5 5.8 6.2 6.1  NEUTROABS 6.9  --   --   --   --   --   --   --    HGB 8.8* 8.2*   < > 8.2* 8.1* 8.6* 8.0* 8.0*  HCT 28.5* 26.4*   < > 26.8* 26.4* 27.3* 25.6* 26.1*  MCV 83.6 83.5  --   --  83.8 82.2 84.2 84.5  PLT 214 193  --   --  201 215 194 187   < > = values in this interval not displayed.     Cardiac Enzymes: No results for input(s): "CKTOTAL", "CKMB", "CKMBINDEX", "TROPONINI" in the last 168 hours.  BNP: Invalid input(s): "POCBNP"  CBG: Recent Labs  Lab 09/22/22 0805 09/22/22 1200 09/22/22 1706 09/22/22 2137 09/23/22 0828  GLUCAP 127* 111* 171* 114* 120*     Microbiology: Results for orders placed or performed during the hospital encounter of 09/18/22  Urine Culture     Status: None   Collection Time: 09/19/22  3:01 AM   Specimen: Urine, Random  Result Value Ref Range Status   Specimen Description   Final    URINE, RANDOM Performed at [facility], [address] [place], [place] [address]    Special Requests   Final    NONE Reflexed from [id number] Performed at [facility], [address] [place], [place] [address]    Culture   Final    NO GROWTH  Performed at [facility], [place] [address], [place],  [address]    Report Status 09/20/2022 FINAL  Final    Coagulation Studies: No results for input(s): "LABPROT", "INR" in the last 72 hours.  Urinalysis: No results for input(s): "COLORURINE", "LABSPEC", "PHURINE", "GLUCOSEU", "HGBUR", "BILIRUBINUR", "KETONESUR", "PROTEINUR", "UROBILINOGEN", "NITRITE", "LEUKOCYTESUR" in the last 72 hours.  Invalid input(s): "APPERANCEUR"     Imaging: DG Chest Port 1 View  Result Date: 09/21/2022 CLINICAL DATA:  Acute on chronic congestive heart failure EXAM: PORTABLE CHEST 1 VIEW COMPARISON:  03/20/2022 FINDINGS: Frontal view of the chest demonstrates an unremarkable cardiac silhouette. No airspace disease, effusion, or pneumothorax. No acute bony abnormality. IMPRESSION: 1. No acute intrathoracic process. Electronically Signed   By: Randa Ngo M.D.    On: 09/21/2022 11:58     Medications:    cefTRIAXone (ROCEPHIN)  IV 1 g (09/23/22 1041)   sodium chloride irrigation Stopped (09/19/22 0340)    Chlorhexidine Gluconate Cloth  6 each Topical Daily   cholecalciferol  1,000 Units Oral Daily   cyanocobalamin  3,000 mcg Oral Daily   insulin aspart  0-5 Units Subcutaneous QHS   insulin aspart  0-9 Units Subcutaneous TID WC   memantine  10 mg Oral BID   mupirocin ointment   Topical Daily   omega-3 acid ethyl esters  1 capsule Oral Daily   Oxcarbazepine  300 mg Oral BID   rosuvastatin  20 mg Oral Daily   acetaminophen **OR** acetaminophen, magnesium hydroxide, ondansetron **OR** ondansetron (ZOFRAN) IV, mouth rinse, QUEtiapine, traZODone  Assessment/ Plan:  [name] is a 67 y.o.  male with frontotemporal dementia, bipolar disorder, traumatic brain injury, PE/DVT, diabetes mellitus type II, hypertension, neurogenic bladder who was admitted to [facility] on 09/18/2022 for Hyperkalemia [E87.5] Bladder mass [N32.89] AKI (acute kidney injury) ([place]) [N17.9] Hematuria [R31.9] Hematuria, unspecified type [R31.9] Pressure injury of skin of sacral region, unspecified injury stage [L89.159]   Acute kidney injury on chronic kidney disease stage IIIB: baseline creatinine of 1.72, GFR of 43 on 03/22/22. Secondary to obstructive uropathy and possibly acute cardiorenal syndrome secondary to acute exacerbation of congestive heart failure.  Creatinine shows some improvement today, 2.58 from 2.81. Continue to hold Bactrim and olmasartan Diuretics remain healed.  Lab Results  Component Value Date   CREATININE 2.58 (H) 09/23/2022   CREATININE 2.81 (H) 09/22/2022   CREATININE 3.01 (H) 09/21/2022    Intake/Output Summary (Last 24 hours) at 09/23/2022 1120 Last data filed at 09/22/2022 1215 Gross per 24 hour  Intake --  Output 350 ml  Net -350 ml    Hyponatremia: hypervolemic. Secondary to congestive heart failure.  Sodium 134 today    Hyperkalemia: with acute kidney injury and chronic Bactrim use.  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  This is remained stable for the past 4 days.   Anemia with chronic kidney disease: hemoglobin also remained stable, 8.0   Hematuria: originally gross urine now clear. Foley catheter was replaced Urology consulted and is concerned about bladder base mass that may be obstructing bladder and persistent hydronephrosis.  Foley catheter with clear yellow urine.  Urology will defer PCN placement as patient is asymptomatic.  They will continue to follow patient at discharge.   Acute exacerbation of chronic diastolic congestive heart failure. Respiratory and volume status stable.    LOS: 4   3/25/202411:20 AM

## 2022-09-23 NOTE — TOC Progression Note (Signed)
Transition of Care [facility]) - Progression Note    Patient Details  Name: [name] MRN: [id number] Date of Birth: 02/15/1956  [facility]) CM/SW Contact  Laurena Slimmer, RN Phone Number: 09/23/2022, 12:13 PM  Clinical Narrative:    Spoke with patient's spouse. She stated she and spouse are agreeable to SNF and would prefer [facility]. She was advised [facility] would be included in the search. She also also been advised after obtaining a bed the insurance has to approve the STR stay.    Expected Discharge Plan: [place] Barriers to Discharge: Continued Medical Work up  Expected Discharge Plan and Services       [name] arrangements for the past 2 months: Single Family Home                                       Social Determinants of Health (SDOH) Interventions SDOH Screenings   Food Insecurity: No Food Insecurity (09/19/2022)  Housing: Low Risk  (09/19/2022)  Transportation Needs: No Transportation Needs (09/19/2022)  Utilities: Not At Risk (09/19/2022)  Depression (PHQ2-9): Low Risk  (06/04/2022)  Financial Resource Strain: Low Risk  (09/09/2017)  Physical Activity: Inactive (09/09/2017)  Social Connections: Unknown (09/09/2017)  Stress: No Stress Concern Present (09/09/2017)  Tobacco Use: Low Risk  (09/19/2022)    Readmission Risk Interventions    03/23/2022   12:09 PM 02/23/2022    3:44 PM 01/14/2022    3:23 PM  Readmission Risk Prevention Plan  Transportation Screening Complete Complete Complete  Medication Review Press photographer) Complete  Complete  PCP or Specialist appointment within 3-5 days of discharge Complete Complete   HRI or [place] Complete Complete Complete  SW Recovery Care/Counseling Consult Complete Complete   Palliative Care Screening Not Applicable Not Applicable Not [place] Not Applicable Not Applicable

## 2022-09-23 NOTE — Progress Notes (Signed)
Progress Note   [name] [id number] DOB: 11-27-1955 DOA: 09/18/2022     4 DOS: the patient was seen and examined on 09/23/2022   Brief hospital course: [name] is a 67 y.o. African-American male with medical history significant for bipolar disorder, type 2 diabetes mellitus without complications, hypertension, TBI, and neurogenic bladder with chronic indwelling Foley cath, who presented to the emergency room with acute onset of hematuria.  Labs revealed hyponatremia of 129 and potassium of 7.1, CO2 16, creatinine 2.69. UA showed 11-20 WBCs with more than 50 RBCs and trace leukocytes. CT scan showed moderate right hydronephrosis, increased since the prior CT. A 3.8 x 2.3 cm mass in the base of the bladder.  Consult from urology and nephrology was obtained.  Potassium normalized after giving fluids.   Patient is also diagnosed with acute on chronic diastolic congestive heart failure, nephrology has started Lasix drip which was completed on 3/23.   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   right hydronephrosis. Patient is improving, gross hematuria resolved.  Discussed with urology, not planning to perform nephrostomy as patient renal function improving.   Bladder mass most likely is a blood clot, will be followed as outpatient.  Patient can be discharged from urology standpoint.   Acute kidney injury superimposed on chronic kidney disease ([place]) secondary to obstruction. Hyperkalemia secondary to acute renal failure. Hyponatremia Metabolic acidosis. Function still improving, still has some metabolic acidosis.  Continue sodium bicarbonate.  Recheck BMP in the morning.   Acute on chronic diastolic congestive heart failure.  POA. Reviewed previous echocardiogram performed a year ago, ejection fraction normal, grade 3 diastolic dysfunction.  Condition improved.   Sacral decubitus ulcer Wound care consulted   Dyslipidemia Essential hypertension Resume home treatment.   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   Type 2 diabetes mellitus without complications ([place]) Continue current treatment.   History of DVT. Eliquis can be restarted by urology.   Morbid obesity with BMI 41.24. Diet and exercise.     Subjective:  Patient doing much better today, currently no complaints.  Physical Exam: Vitals:   09/22/22 1934 09/22/22 2344 09/23/22 0301 09/23/22 0827  BP: 120/74 114/71 120/70 (!) 149/70  Pulse: 65 60 60 60  Resp:    16  Temp:  98.9 F (37.2 C)  97.6 F (36.4 C)  TempSrc:  Oral    SpO2: 100% 100% 100% 100%  Weight:      Height: 5\' 9"  (1.753 m)      General exam: Appears calm and comfortable  Respiratory system: Clear to auscultation. Respiratory effort normal. Cardiovascular system: S1 & S2 heard, RRR. No JVD, murmurs, rubs, gallops or clicks. No pedal edema. Gastrointestinal system: Abdomen is nondistended, soft and  nontender. No organomegaly or masses felt. Normal bowel sounds heard. Central nervous system: Alert and oriented. No focal neurological deficits. Extremities: Symmetric 5 x 5 power. Skin: No rashes, lesions or  ulcers Psychiatry:  Mood & affect appropriate.    Data Reviewed:  Lab results reviewed  Family Communication: wife updated over the phone  Disposition: Status is: Inpatient Remains inpatient appropriate because: Severity of disease, unsafe discharge. Pending nursing home placement.     Time spent: 35 minutes  Author: Sharen Hones, MD 09/23/2022 11:56 AM  For on call review [email or web address].

## 2022-09-23 NOTE — Progress Notes (Signed)
Surgical Physician Order Form [facility] [place]  Dr. Diamantina Providence * Scheduling expectation :  2-3 weeks  *Length of Case:   *Clearance needed: no  *Anticoagulation Instructions: Hold all anticoagulants  *Aspirin Instructions: N/A  *Post-op visit Date/Instructions:   TBD  *Diagnosis: Right Hydronephrosis, bladder mass  *Procedure: Cystoscopy, possible TURBT, right retrograde pyelogram   Additional orders: N/A  -Admit type: OUTpatient  -Anesthesia: General  -VTE Prophylaxis Standing Order SCD's       Other:   -Standing Lab Orders Per Anesthesia    Lab other: None  -Standing Test orders EKG/Chest x-ray per Anesthesia       Test other:   - Medications:  Ancef 2gm IV  -Other orders:  N/A

## 2022-09-23 NOTE — Progress Notes (Addendum)
Urology Inpatient Progress Note  Subjective: No acute events overnight.  He is afebrile, VSS. Creatinine down today, 2.58 in the setting of discontinuing Lasix. Renal ultrasound on Friday with interval improvement but not resolution of right hydronephrosis. He reports no pain today.  Foley catheter in place draining clear, yellow urine.  Anti-infectives: Anti-infectives (From admission, onward)    Start     Dose/Rate Route Frequency Ordered Stop   09/19/22 1100  cefTRIAXone (ROCEPHIN) 1 g in sodium chloride 0.9 % 100 mL IVPB        1 g 200 mL/hr over 30 Minutes Intravenous Every 24 hours 09/19/22 1058         Current Facility-Administered Medications  Medication Dose Route Frequency Provider Last Rate Last Admin   acetaminophen (TYLENOL) tablet 650 mg  650 mg Oral Q6H PRN Mansy, Jan A, MD       Or   acetaminophen (TYLENOL) suppository 650 mg  650 mg Rectal Q6H PRN Mansy, Jan A, MD       cefTRIAXone (ROCEPHIN) 1 g in sodium chloride 0.9 % 100 mL IVPB  1 g Intravenous Q24H Val Riles, MD 200 mL/hr at 09/22/22 1210 1 g at 09/22/22 1210   Chlorhexidine Gluconate Cloth 2 % PADS 6 each  6 each Topical Daily Sharen Hones, MD   6 each at 09/23/22 0908   cholecalciferol (VITAMIN D3) 25 MCG (1000 UNIT) tablet 1,000 Units  1,000 Units Oral Daily Mansy, Jan A, MD   1,000 Units at 09/23/22 [id number]   cyanocobalamin (VITAMIN B12) tablet 3,000 mcg  3,000 mcg Oral Daily Mansy, Jan A, MD   3,000 mcg at 09/23/22 [id number]   insulin aspart (novoLOG) injection 0-5 Units  0-5 Units Subcutaneous QHS Mansy, Jan A, MD       insulin aspart (novoLOG) injection 0-9 Units  0-9 Units Subcutaneous TID WC Mansy, Jan A, MD   2 Units at 09/22/22 1716   magnesium hydroxide (MILK OF MAGNESIA) suspension 30 mL  30 mL Oral Daily PRN Mansy, Jan A, MD       memantine [facility]) tablet 10 mg  10 mg Oral BID Mansy, Jan A, MD   10 mg at 09/23/22 [id number]   mupirocin ointment (BACTROBAN) 2 %   Topical Daily Val Riles, MD   Given at  09/23/22 [id number]   omega-3 acid ethyl esters (LOVAZA) capsule 1 g  1 capsule Oral Daily Mansy, Jan A, MD   1 g at 09/23/22 0927   ondansetron (ZOFRAN) tablet 4 mg  4 mg Oral Q6H PRN Mansy, Jan A, MD       Or   ondansetron [facility]) injection 4 mg  4 mg Intravenous Q6H PRN Mansy, Jan A, MD       Oral care mouth rinse  15 mL Mouth Rinse PRN Sharen Hones, MD       Oxcarbazepine (TRILEPTAL) tablet 300 mg  300 mg Oral BID Mansy, Jan A, MD   300 mg at 09/23/22 [id number]   QUEtiapine (SEROQUEL) tablet 50 mg  50 mg Oral Daily PRN Mansy, Jan A, MD       rosuvastatin (CRESTOR) tablet 20 mg  20 mg Oral Daily Mansy, Jan A, MD   20 mg at 09/23/22 [id number]   sodium chloride irrigation 0.9 % 3,000 mL  3,000 mL Irrigation Continuous Paulette Blanch, MD   Stopped at 09/19/22 0340   traZODone (DESYREL) tablet 25 mg  25 mg Oral QHS PRN Mansy, Arvella Merles, MD   25 mg at  09/22/22 2104     Objective: Vital signs in last 24 hours: Temp:  [97.6 F (36.4 C)-98.9 F (37.2 C)] 97.6 F (36.4 C) (03/25 0827) Pulse Rate:  [58-70] 60 (03/25 0827) Resp:  [15-16] 16 (03/25 0827) BP: (114-149)/(68-80) 149/70 (03/25 0827) SpO2:  [93 %-100 %] 100 % (03/25 0827)  Intake/Output from previous day: 03/24 0701 - 03/25 0700 In: -  Out: 350 [Urine:350] Intake/Output this shift: No intake/output data recorded.  Physical Exam Vitals and nursing note reviewed.  Constitutional:      General: He is not in acute distress.    Appearance: He is not ill-appearing, toxic-appearing or diaphoretic.  HENT:     Head: Normocephalic and atraumatic.  Pulmonary:     Effort: Pulmonary effort is normal. No respiratory distress.  Skin:    General: Skin is warm and dry.  Neurological:     Mental Status: He is alert. Mental status is at baseline.  Psychiatric:        Mood and Affect: Mood normal.        Behavior: Behavior normal.    Lab Results:  Recent Labs    09/22/22 0445 09/23/22 0341  WBC 6.2 6.1  HGB 8.0* 8.0*  HCT 25.6* 26.1*  PLT 194 187    BMET Recent Labs    09/22/22 0445 09/23/22 0341  NA 133* 134*  K 4.6 4.4  CL 109 106  CO2 19* 19*  GLUCOSE 141* 134*  BUN 58* 58*  CREATININE 2.81* 2.58*  CALCIUM 8.1* 8.2*   Assessment & Plan: 67 year old male with PMH neurogenic bladder secondary to TBI managed with chronic indwelling Foley catheter, recurrent UTI, and urethral stricture currently admitted with metabolic derangements, AKI, and gross hematuria with admission imaging notable for malpositioned Foley catheter, now replaced, possible bladder base mass, and progressive right hydronephrosis.  Hydronephrosis has improved but not resolved following Foley catheter replacement.  Creatinine is downtrending in the setting of having stopped Lasix.  He is asymptomatic of his right hydronephrosis.  Will defer right nephrostomy tube placement at this time.  Okay to resume Eliquis.  I spoke with Dr. Diamantina Providence, who recommends planning for outpatient cystoscopy with possible TURBT and right retrograde pyelogram in the coming weeks.  Will have him hold Eliquis preop.  Patient is in agreement with this plan. I also spoke with Mrs. Steck via telephone to discuss the plan and she is also in agreement.  Will fill out a booking sheet for him.  Okay for discharge from the urologic perspective.  Debroah Loop, PA-C 09/23/2022

## 2022-09-23 NOTE — Progress Notes (Signed)
Physical Therapy Treatment Patient Details Name: [name] MRN: [id number] DOB: 08/19/1955 Today's Date: 09/23/2022   History of Present Illness [name] is a 67 y.o. African-American male with medical history significant for bipolar disorder, type 2 diabetes mellitus without complications, hypertension, TBI, and neurogenic bladder with chronic indwelling Foley cath, who presented to the emergency room with acute onset of hematuria.    PT Comments    Patient received in bed, he states he wants to do therapy at home. Wife present for session this date. She is trying to encourage him to go to rehab prior to home. Patient was able to perform bed mobility with supervision, and heavy use of bed rails. He will reach out and ask for assist but he can do it. Patient requires mod A to stand from elevated bed. He is able to walk 3 feet forward, 3 feet back and 3 feet to the side with RW and min guard. Patient will continue to benefit from skilled PT to improve functional independence, strength and safety.       Recommendations for follow up therapy are one component of a multi-disciplinary discharge planning process, led by the attending physician.  Recommendations may be updated based on patient status, additional functional criteria and insurance authorization.  Follow Up Recommendations  Can patient physically be transported by private vehicle: No    Assistance Recommended at Discharge Intermittent Supervision/Assistance  Patient can return home with the following A lot of help with walking and/or transfers;A lot of help with bathing/dressing/bathroom;Assist for transportation;Direct supervision/assist for medications management   Equipment Recommendations  None recommended by PT    Recommendations for Other Services       Precautions / Restrictions Precautions Precautions: Fall Precaution Comments: multiple falls per wife Restrictions Weight Bearing Restrictions:  No     Mobility  Bed Mobility Overal bed mobility: Modified Independent Bed Mobility: Supine to Sit, Sit to Supine     Supine to sit: Modified independent (Device/Increase time) Sit to supine: Modified independent (Device/Increase time)   General bed mobility comments: Patient can do more than he lets on. Wife was present this date and makes him do it as he wants to go home and he was able to do it with supervision and use of bed rails.    Transfers Overall transfer level: Needs assistance Equipment used: Rolling walker (2 wheels) Transfers: Sit to/from Stand Sit to Stand: Mod assist, From elevated surface           General transfer comment: Patient unable to stand fully erect and requires mod assist to stand from elevated bed.    Ambulation/Gait Ambulation/Gait assistance: Min assist Gait Distance (Feet): 3 Feet (3 feet forward, 3 feet back and 3 feet side stepping) Assistive device: Rolling walker (2 wheels) Gait Pattern/deviations: Step-to pattern, Decreased step length - right, Decreased step length - left, Decreased stride length, Trunk flexed, Knee flexed in stance - left, Knee flexed in stance - right Gait velocity: decr     General Gait Details: able to step forward and back and side step this session   Stairs             Wheelchair Mobility    Modified Rankin (Stroke Patients Only)       Balance Overall balance assessment: Needs assistance, History of Falls Sitting-balance support: Feet supported, Bilateral upper extremity supported Sitting balance-Leahy Scale: Fair     Standing balance support: Bilateral upper extremity supported, During functional activity, Reliant on assistive device for balance Standing  balance-Leahy Scale: Poor Standing balance comment: heavy use of BSC                            Cognition Arousal/Alertness: Awake/alert Behavior During Therapy: WFL for tasks assessed/performed Overall Cognitive Status:  History of cognitive impairments - at baseline                                          Exercises      General Comments        Pertinent Vitals/Pain Pain Assessment Pain Assessment: No/denies pain    Home Living                          Prior Function            PT Goals (current goals can now be found in the care plan section) Acute Rehab PT Goals Patient Stated Goal: to return home with wife assist-wife would like for him to go to SNF, but if he can get in and out of bed and stand with assist she may be able to take him home. PT Goal Formulation: With patient/family Time For Goal Achievement: 10/03/22 Potential to Achieve Goals: Fair Progress towards PT goals: Progressing toward goals    Frequency    Min 2X/week      PT Plan Current plan remains appropriate    Co-evaluation              AM-PAC PT "6 Clicks" Mobility   Outcome Measure  Help needed turning from your back to your side while in a flat bed without using bedrails?: A Lot Help needed moving from lying on your back to sitting on the side of a flat bed without using bedrails?: A Lot Help needed moving to and from a bed to a chair (including a wheelchair)?: A Lot Help needed standing up from a chair using your arms (e.g., wheelchair or bedside chair)?: A Lot Help needed to walk in hospital room?: A Lot Help needed climbing 3-5 steps with a railing? : Total 6 Click Score: 11    End of Session   Activity Tolerance: Patient tolerated treatment well Patient left: in bed;with call bell/phone within reach;with bed alarm set;with family/visitor present Nurse Communication: Mobility status PT Visit Diagnosis: Other abnormalities of gait and mobility (R26.89);Muscle weakness (generalized) (M62.81)     Time: [id number] PT Time Calculation (min) (ACUTE ONLY): 15 min  Charges:  $Therapeutic Activity: 8-22 mins                     Alanya Vukelich, PT, GCS 09/23/22,3:33  PM

## 2022-09-24 DIAGNOSIS — R310 Gross hematuria: Secondary | ICD-10-CM | POA: Diagnosis not present

## 2022-09-24 DIAGNOSIS — N189 Chronic kidney disease, unspecified: Secondary | ICD-10-CM | POA: Diagnosis not present

## 2022-09-24 DIAGNOSIS — N179 Acute kidney failure, unspecified: Secondary | ICD-10-CM | POA: Diagnosis not present

## 2022-09-24 DIAGNOSIS — N183 Chronic kidney disease, stage 3 unspecified: Secondary | ICD-10-CM | POA: Insufficient documentation

## 2022-09-24 DIAGNOSIS — I5033 Acute on chronic diastolic (congestive) heart failure: Secondary | ICD-10-CM | POA: Diagnosis not present

## 2022-09-24 LAB — PROTEIN ELECTROPHORESIS, SERUM
A/G Ratio: 0.8 (ref 0.7–1.7)
Albumin ELP: 3.1 g/dL (ref 2.9–4.4)
Alpha-1-Globulin: 0.3 g/dL (ref 0.0–0.4)
Alpha-2-Globulin: 0.8 g/dL (ref 0.4–1.0)
Beta Globulin: 1 g/dL (ref 0.7–1.3)
Gamma Globulin: 2.1 g/dL — ABNORMAL HIGH (ref 0.4–1.8)
Globulin, Total: 4.1 g/dL — ABNORMAL HIGH (ref 2.2–3.9)
Total Protein ELP: 7.2 g/dL (ref 6.0–8.5)

## 2022-09-24 LAB — CBC
HCT: 25.7 % — ABNORMAL LOW (ref 39.0–52.0)
Hemoglobin: 7.8 g/dL — ABNORMAL LOW (ref 13.0–17.0)
MCH: 25.7 pg — ABNORMAL LOW (ref 26.0–34.0)
MCHC: 30.4 g/dL (ref 30.0–36.0)
MCV: 84.5 fL (ref 80.0–100.0)
Platelets: 167 10*3/uL (ref 150–400)
RBC: 3.04 MIL/uL — ABNORMAL LOW (ref 4.22–5.81)
RDW: 18.1 % — ABNORMAL HIGH (ref 11.5–15.5)
WBC: 6.4 10*3/uL (ref 4.0–10.5)
nRBC: 0 % (ref 0.0–0.2)

## 2022-09-24 LAB — GLUCOSE, CAPILLARY
Glucose-Capillary: 125 mg/dL — ABNORMAL HIGH (ref 70–99)
Glucose-Capillary: 132 mg/dL — ABNORMAL HIGH (ref 70–99)
Glucose-Capillary: 129 mg/dL — ABNORMAL HIGH (ref 70–99)
Glucose-Capillary: 135 mg/dL — ABNORMAL HIGH (ref 70–99)

## 2022-09-24 LAB — BASIC METABOLIC PANEL
Anion gap: 5 (ref 5–15)
BUN: 56 mg/dL — ABNORMAL HIGH (ref 8–23)
CO2: 21 mmol/L — ABNORMAL LOW (ref 22–32)
Calcium: 8.2 mg/dL — ABNORMAL LOW (ref 8.9–10.3)
Chloride: 109 mmol/L (ref 98–111)
Creatinine, Ser: 2.25 mg/dL — ABNORMAL HIGH (ref 0.61–1.24)
GFR, Estimated: 31 mL/min — ABNORMAL LOW (ref >60)
Glucose, Bld: 128 mg/dL — ABNORMAL HIGH (ref 70–99)
Potassium: 4.7 mmol/L (ref 3.5–5.1)
Sodium: 135 mmol/L (ref 135–145)

## 2022-09-24 LAB — FOLATE: Folate: 18.8 ng/mL (ref >5.9)

## 2022-09-24 LAB — MAGNESIUM: Magnesium: 2.3 mg/dL (ref 1.7–2.4)

## 2022-09-24 MED ORDER — SODIUM BICARBONATE 650 MG PO TABS
650.0000 mg | ORAL_TABLET | Freq: Two times a day (BID) | ORAL | Status: DC
  Administered 2022-09-24 – 2022-09-25 (×3): 650 mg via ORAL
  Filled 2022-09-24 (×3): qty 1

## 2022-09-24 MED ORDER — CEPHALEXIN 500 MG PO CAPS: 500.0000 mg | ORAL_CAPSULE | Freq: Four times a day (QID) | ORAL | Status: DC

## 2022-09-24 MED ORDER — SODIUM CHLORIDE 0.9 % IV SOLN
100.0000 mg | Freq: Once | INTRAVENOUS | Status: AC
  Administered 2022-09-24: 100 mg via INTRAVENOUS
  Filled 2022-09-24: qty 100

## 2022-09-24 MED ORDER — FUROSEMIDE 40 MG PO TABS
40.0000 mg | ORAL_TABLET | Freq: Every day | ORAL | Status: DC
  Administered 2022-09-24 – 2022-09-25 (×2): 40 mg via ORAL
  Filled 2022-09-24 (×2): qty 1

## 2022-09-24 NOTE — NC FL2 (Signed)
[place] LEVEL OF CARE FORM     IDENTIFICATION  Patient Name: [name] Birthdate: 05-30-1956 Sex: male Admission Date (Current Location): 09/18/2022  [facility] and [place] Number:  Engineering geologist and Address:  [facility], [address], [place], [place] [address]      Provider Number: [id number]  Attending Physician Name and Address:  Sharen Hones, MD  Relative Name and Phone Number:  Aragorn, Mendez (Spouse) [phone]    Current Level of Care: Hospital Recommended Level of Care: [place] Prior Approval Number:    Date Approved/Denied:   PASRR Number: [id number]  Discharge Plan: SNF    Current Diagnoses: Patient Active Problem List   Diagnosis Date Noted   Anemia of chronic kidney failure, stage 3 (moderate) ([place]) 09/24/2022   Gross hematuria 09/22/2022   Hydronephrosis of right kidney [id number]   Metabolic acidosis 123XX123   Hyperkalemia 09/19/2022   Mass of urinary bladder 09/19/2022   Sepsis due to gram-negative UTI ([place]) 03/20/2022   Type 2 diabetes mellitus without complications ([place]) [id number]   Dyslipidemia 123456   Complicated UTI (urinary tract infection) 02/22/2022   Acute renal failure superimposed on stage 3b chronic kidney disease ([place]) 02/22/2022   Acute on chronic diastolic CHF (congestive heart failure) ([place]) 02/22/2022   Obesity with body mass index of 30.0-39.9 02/22/2022   Cellulitis of left thigh 02/22/2022   Essential hypertension 01/13/2022   Seizure prophylaxis 01/13/2022   Acute pyelonephritis 01/05/2022   Fever 12/16/2021   Hypotension 10/09/2021   Abdominal distension 10/08/2021   AKI (acute kidney injury) ([place]) 10/08/2021   Sacral decubitus ulcer 10/07/2021   MSSA bacteremia 10/07/2021   Severe sepsis ([place]) 10/06/2021   Sepsis secondary to UTI ([place]) 07/28/2021   COVID-19 virus infection 07/28/2021   Chronic indwelling Foley catheter  06/22/2021   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   TBI (traumatic brain injury) ([place])    Healed ulcer of foot on examination 04/14/2021   Acute UTI 02/23/2021   Osteomyelitis of vertebra, sacral and sacrococcygeal region ([place]) 10/18/2020   Hyponatremia 07/07/2020   Encephalopathy 07/07/2020   Chronic multifocal osteomyelitis of right foot ([place]) 07/07/2020   Anemia of chronic disease 07/07/2020   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[place]) 06/01/2020   Sacral osteomyelitis ([place]) 04/13/2020   Urinary retention 03/19/2020   History of DVT (deep vein thrombosis) 03/19/2020   Pressure injury of sacral region, stage 4 ([place]) 03/19/2020   Severe sepsis with septic shock ([place]) 03/18/2020   Obesity, Class III, BMI 40-49.9 (morbid obesity) ([place]) 03/18/2020   Cellulitis 03/18/2020   Acute cystitis without hematuria    Stage 3b chronic kidney disease (CKD) ([place]) - baseline SCr 1.8-1.9 01/20/2020   Decubitus ulcers 01/17/2020   Sepsis ([place]) 01/16/2020   Cellulitis of sacral region 01/16/2020   Acute kidney injury superimposed on chronic kidney disease ([place]) [id number]   Acute metabolic encephalopathy [id number]   Bipolar disorder, in full remission, most recent episode mixed ([place]) 12/29/2019   High risk medication use 10/25/2019   Noncompliance with treatment plan 10/25/2019   Bipolar I disorder, most recent episode mixed ([place]) 01/07/2019   GAD (generalized anxiety disorder) 01/07/2019   Insomnia due to medical condition 01/07/2019   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   Venous thromboembolism (VTE) 07/06/2014    Orientation RESPIRATION BLADDER Height & Weight     Self, Time, Situation, Place  Normal External catheter Weight: 115.9 kg Height:  5\' 9"  (175.3 cm)  BEHAVIORAL SYMPTOMS/MOOD NEUROLOGICAL BOWEL NUTRITION STATUS  Other (Comment) (n/a)  (n/a) Colostomy Diet (Heart)  AMBULATORY STATUS COMMUNICATION OF NEEDS Skin   Limited Assist Verbally PU Stage and Appropriate Care (Bilateral buttocks- unstagable with faom dressing)                       Personal Care Assistance Level of Assistance  Dressing Bathing Assistance: Limited assistance Feeding assistance: Independent Dressing Assistance: Limited assistance     Functional Limitations Info  Sight, Hearing, Speech Sight Info: Impaired Hearing Info: Adequate Speech Info: Adequate    SPECIAL CARE FACTORS FREQUENCY  PT (By licensed PT), OT (By licensed OT)     PT Frequency: Min 2x weekly OT Frequency: Min 2x weekly            Contractures Contractures Info: Not present    Additional Factors Info  Code Status Code Status Info: FULL Allergies Info: No Known Allergies   Insulin Sliding Scale Info: QUEtiapine (SEROQUEL) tablet 50 mg , traZODone (DESYREL) tablet 25 mg       Current Medications (09/24/2022):  This is the current hospital active medication list Current Facility-Administered Medications  Medication Dose Route Frequency Provider Last Rate Last Admin   acetaminophen (TYLENOL) tablet 650 mg  650 mg Oral Q6H PRN Mansy, Jan A, MD       Or   acetaminophen (TYLENOL) suppository 650 mg  650 mg Rectal Q6H PRN Mansy, Jan A, MD       apixaban [facility]) tablet 5 mg  5 mg Oral BID Sharen Hones, MD   5 mg at 09/24/22 [id number]   Chlorhexidine Gluconate Cloth 2 % PADS 6 each  6 each Topical Daily Sharen Hones, MD    6 each at 09/23/22 0908   cholecalciferol (VITAMIN D3) 25 MCG (1000 UNIT) tablet 1,000 Units  1,000 Units Oral Daily Mansy, Jan A, MD   1,000 Units at 09/24/22 [id number]   cyanocobalamin (VITAMIN B12) tablet 3,000 mcg  3,000 mcg Oral Daily Mansy, Jan A, MD   3,000 mcg at 09/24/22 [id number]   insulin aspart (novoLOG) injection 0-5 Units  0-5 Units Subcutaneous QHS Mansy, Jan A, MD       insulin aspart (novoLOG) injection 0-9 Units  0-9 Units Subcutaneous TID WC Mansy, Jan A, MD   1 Units at 09/24/22 [id number]   magnesium hydroxide (MILK OF MAGNESIA) suspension 30 mL  30 mL Oral Daily PRN Mansy, Jan A, MD       memantine [facility]) tablet 10 mg  10 mg Oral BID Mansy, Jan A, MD   10 mg at 09/24/22 [id number]   mupirocin ointment (BACTROBAN) 2 %   Topical Daily Val Riles, MD   Given at 09/23/22 [id number]   omega-3 acid ethyl esters (LOVAZA) capsule 1 g  1 capsule Oral Daily Mansy, Jan A, MD   1 g at 09/24/22 0905   ondansetron (ZOFRAN) tablet 4 mg  4 mg Oral Q6H PRN Mansy, Arvella Merles, MD  Or   ondansetron (ZOFRAN) injection 4 mg  4 mg Intravenous Q6H PRN Mansy, Arvella Merles, MD       Oral care mouth rinse  15 mL Mouth Rinse PRN Sharen Hones, MD       Oxcarbazepine (TRILEPTAL) tablet 300 mg  300 mg Oral BID Mansy, Jan A, MD   300 mg at 09/24/22 0906   QUEtiapine (SEROQUEL) tablet 50 mg  50 mg Oral Daily PRN Mansy, Jan A, MD       rosuvastatin (CRESTOR) tablet 20 mg  20 mg Oral Daily Mansy, Jan A, MD   20 mg at 09/24/22 [id number]   sodium chloride irrigation 0.9 % 3,000 mL  3,000 mL Irrigation Continuous Paulette Blanch, MD   Stopped at 09/19/22 0340   traZODone (DESYREL) tablet 25 mg  25 mg Oral QHS PRN Mansy, Jan A, MD   25 mg at 09/23/22 2125     Discharge Medications: Please see discharge summary for a list of discharge medications.  Relevant Imaging Results:  Relevant Lab Results:   Additional Information SSN [id number]  Laurena Slimmer, RN

## 2022-09-24 NOTE — Progress Notes (Signed)
[facility]  ROUNDING NOTE   Subjective:   Patient seen sitting up in bed, alert and oriented Denies nausea or vomiting Tolerating meals  Creatinine 2.25 Urine output 1.45 L.  Objective:  Vital signs in last 24 hours:  Temp:  [97.6 F (36.4 C)-98.7 F (37.1 C)] 97.6 F (36.4 C) (03/26 1219) Pulse Rate:  [55-65] 55 (03/26 1219) Resp:  [16-20] 16 (03/26 1219) BP: (108-155)/(57-86) 108/57 (03/26 1219) SpO2:  [100 %] 100 % (03/26 1219)  Weight change:  Filed Weights   09/18/22 2309 09/20/22 1824  Weight: 128.7 kg 115.9 kg    Intake/Output: I/O last 3 completed shifts: In: 480 [P.O.:480] Out: 1450 [Urine:1450]   Intake/Output this shift:  No intake/output data recorded.  Physical Exam: General: NAD  Head: Normocephalic, atraumatic. Moist oral mucosal membranes  Eyes: Anicteric  Lungs:  Diminished bases, normal effort  Heart: Regular rate and rhythm  Abdomen:  Soft, nontender, distended  Extremities:  Trace peripheral edema.  Neurologic: Alert and oriented, moving all four extremities  Skin: No lesions  Access: None    Basic Metabolic Panel: Recent Labs  Lab 09/20/22 0438 09/21/22 0431 09/22/22 0445 09/23/22 0341 09/24/22 0423  NA 135 133* 133* 134* 135  K 5.3* 4.9 4.6 4.4 4.7  CL 104 107 109 106 109  CO2 19* 19* 19* 19* 21*  GLUCOSE 130* 129* 141* 134* 128*  BUN 43* 53* 58* 58* 56*  CREATININE 2.79* 3.01* 2.81* 2.58* 2.25*  CALCIUM 8.9 8.3* 8.1* 8.2* 8.2*  MG 1.9 1.9 2.0  --  2.3  PHOS 3.7 4.5 4.8*  --   --      Liver Function Tests: No results for input(s): "AST", "ALT", "ALKPHOS", "BILITOT", "PROT", "ALBUMIN" in the last 168 hours. No results for input(s): "LIPASE", "AMYLASE" in the last 168 hours. No results for input(s): "AMMONIA" in the last 168 hours.  CBC: Recent Labs  Lab 09/18/22 2347 09/19/22 0527 09/20/22 0438 09/21/22 0431 09/22/22 0445 09/23/22 0341 09/24/22 0423  WBC 9.5   < > 6.5 5.8 6.2 6.1 6.4  NEUTROABS  6.9  --   --   --   --   --   --   HGB 8.8*   < > 8.1* 8.6* 8.0* 8.0* 7.8*  HCT 28.5*   < > 26.4* 27.3* 25.6* 26.1* 25.7*  MCV 83.6   < > 83.8 82.2 84.2 84.5 84.5  PLT 214   < > 201 215 194 187 167   < > = values in this interval not displayed.     Cardiac Enzymes: No results for input(s): "CKTOTAL", "CKMB", "CKMBINDEX", "TROPONINI" in the last 168 hours.  BNP: Invalid input(s): "POCBNP"  CBG: Recent Labs  Lab 09/23/22 1230 09/23/22 1609 09/23/22 2032 09/24/22 0835 09/24/22 1219  GLUCAP 136* 151* 137* 125* 132*     Microbiology: Results for orders placed or performed during the hospital encounter of 09/18/22  Urine Culture     Status: None   Collection Time: 09/19/22  3:01 AM   Specimen: Urine, Random  Result Value Ref Range Status   Specimen Description   Final    URINE, RANDOM Performed at [facility], [address], [place], [place] [address]    Special Requests   Final    NONE Reflexed from [phone] [id number] Performed at [facility], [address], [place], [place] [address]    Culture   Final    NO GROWTH Performed at [facility], [place]  [address], [place], [place] [address]    Report Status 09/20/2022 FINAL  Final    Coagulation Studies: No results for input(s): "LABPROT", "INR" in the last 72 hours.  Urinalysis: No results for input(s): "COLORURINE", "LABSPEC", "PHURINE", "GLUCOSEU", "HGBUR", "BILIRUBINUR", "KETONESUR", "PROTEINUR", "UROBILINOGEN", "NITRITE", "LEUKOCYTESUR" in the last 72 hours.  Invalid input(s): "APPERANCEUR"     Imaging: No results found.   Medications:    sodium chloride irrigation Stopped (09/19/22 0340)    apixaban  5 mg Oral BID   Chlorhexidine Gluconate Cloth  6 each Topical Daily   cholecalciferol  1,000 Units Oral Daily   cyanocobalamin  3,000 mcg Oral Daily   insulin aspart  0-5 Units Subcutaneous QHS   insulin aspart  0-9 Units Subcutaneous TID WC   memantine  10 mg Oral BID   mupirocin  ointment   Topical Daily   omega-3 acid ethyl esters  1 capsule Oral Daily   Oxcarbazepine  300 mg Oral BID   rosuvastatin  20 mg Oral Daily   acetaminophen **OR** acetaminophen, magnesium hydroxide, ondansetron **OR** ondansetron (ZOFRAN) IV, mouth rinse, QUEtiapine, traZODone  Assessment/ Plan:  [name] is a 67 y.o.  male with frontotemporal dementia, bipolar disorder, traumatic brain injury, PE/DVT, diabetes mellitus type II, hypertension, neurogenic bladder who was admitted to [facility] on 09/18/2022 for Hyperkalemia [E87.5] Bladder mass [N32.89] AKI (acute kidney injury) ([place]) [N17.9] Hematuria [R31.9] Hematuria, unspecified type [R31.9] Pressure injury of skin of sacral region, unspecified injury stage [L89.159]   Acute kidney injury on chronic kidney disease stage IIIB: baseline creatinine of 1.72, GFR of 43 on 03/22/22. Secondary to obstructive uropathy and possibly acute cardiorenal syndrome secondary to acute exacerbation of congestive heart failure.  Creatinine improving with adequate urine output. Continue to hold Bactrim and olmasartan Will restart furosemide 40mg  daily, po  Lab Results  Component Value Date   CREATININE 2.25 (H) 09/24/2022   CREATININE 2.58 (H) 09/23/2022   CREATININE 2.81 (H) 09/22/2022    Intake/Output Summary (Last 24 hours) at 09/24/2022 1256 Last data filed at 09/24/2022 0500 Gross per 24 hour  Intake 480 ml  Output 1450 ml  Net -970 ml    Hyponatremia: hypervolemic. Secondary to congestive heart failure.  Sodium 135, corrected   Hyperkalemia: with acute kidney injury and chronic Bactrim use.  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 Foley catheter was replaced Urology consulted and is concerned about bladder base mass  that may be obstructing bladder and persistent hydronephrosis.  Foley catheter with clear yellow urine.  Urology will defer PCN placement as patient is asymptomatic.  They will continue to follow patient at discharge.   Acute exacerbation of chronic diastolic congestive heart failure. Respiratory and volume status stable.    LOS: 5   3/26/202412:56 PM

## 2022-09-24 NOTE — Progress Notes (Signed)
Occupational Therapy Treatment Patient Details Name: [name] MRN: [id number] DOB: 1956/02/16 Today's Date: 09/24/2022   History of present illness [name] is a 67 y.o. African-American male with medical history significant for bipolar disorder, type 2 diabetes mellitus without complications, hypertension, TBI, and neurogenic bladder with chronic indwelling Foley cath, who presented to the emergency room with acute onset of hematuria.   OT comments  Pt was able to transfer supine<sit largely on his own and could maintain sitting balance on EOB for 15 minutes, with occassional R lateral lean but able to self-correct with verbal cueing. Attempted sit<>stand with RW, however, pt unable to come fully into standing with Max A +1, as his legs gave out from under him. Requires Mod A to move sit<supine, w/ particular support for elevating b/l LE to bed level. Pt needs Max A + 2 for repositioning towards HOB. Discussed DC recs, w/ pt stating he wants to return home, therapist stating she has concerns about pt's wife being able to handle his needs unless pt is better able to stand and assist in bed<>WC transfer.    Recommendations for follow up therapy are one component of a multi-disciplinary discharge planning process, led by the attending physician.  Recommendations may be updated based on patient status, additional functional criteria and insurance authorization.    Assistance Recommended at Discharge Frequent or constant Supervision/Assistance  Patient can return home with the following  Two people to help with walking and/or transfers;Two people to help with bathing/dressing/bathroom;Help with stairs or ramp for entrance   Equipment Recommendations  None recommended by OT    Recommendations for Other Services      Precautions / Restrictions Precautions Precautions: Fall Precaution Comments: multiple falls per wife Restrictions Weight Bearing Restrictions: No        Mobility Bed Mobility Overal bed mobility: Needs Assistance Bed Mobility: Supine to Sit, Sit to Supine     Supine to sit: Min assist Sit to supine: Mod assist        Transfers Overall transfer level: Needs assistance Equipment used: Rolling walker (2 wheels) Transfers: Sit to/from Stand Sit to Stand: Mod assist, From elevated surface           General transfer comment: Patient unable to stand fully erect and requires mod assist to stand from elevated bed.     Balance Overall balance assessment: Needs assistance, History of Falls Sitting-balance support: Feet supported, Bilateral upper extremity supported Sitting balance-Leahy Scale: Fair Sitting balance - Comments: requires cues for hand placement to assist   Standing balance support: Bilateral upper extremity supported, During functional activity, Reliant on assistive device for balance Standing balance-Leahy Scale: Poor Standing balance comment: unable to come fully into standing                           ADL either performed or assessed with clinical judgement   ADL                                         General ADL Comments: Max-Total A for all OOB fxl mobility & LB dressing    Extremity/Trunk Assessment Upper Extremity Assessment Upper Extremity Assessment: Generalized weakness   Lower Extremity Assessment Lower Extremity Assessment: Generalized weakness        Vision       Perception     Praxis  Cognition Arousal/Alertness: Awake/alert Behavior During Therapy: WFL for tasks assessed/performed Overall Cognitive Status: History of cognitive impairments - at baseline                                          Exercises Other Exercises Other Exercises: Educ re: DC options    Shoulder Instructions       General Comments      Pertinent Vitals/ Pain       Pain Assessment Pain Assessment: No/denies pain  Home Living                                           Prior Functioning/Environment              Frequency  Min 2X/week        Progress Toward Goals  OT Goals(current goals can now be found in the care plan section)  Progress towards OT goals: Progressing toward goals  Acute Rehab OT Goals OT Goal Formulation: With patient Time For Goal Achievement: 10/03/22 Potential to Achieve Goals: Good  Plan Discharge plan remains appropriate    Co-evaluation                 AM-PAC OT "6 Clicks" Daily Activity     Outcome Measure   Help from another person eating meals?: None Help from another person taking care of personal grooming?: A Little Help from another person toileting, which includes using toliet, bedpan, or urinal?: A Lot Help from another person bathing (including washing, rinsing, drying)?: A Lot Help from another person to put on and taking off regular upper body clothing?: A Little Help from another person to put on and taking off regular lower body clothing?: A Lot 6 Click Score: 16    End of Session Equipment Utilized During Treatment: Rolling walker (2 wheels)  OT Visit Diagnosis: Other abnormalities of gait and mobility (R26.89);Muscle weakness (generalized) (M62.81)   Activity Tolerance Patient tolerated treatment well   Patient Left in bed;with call bell/phone within reach   Nurse Communication Mobility status        Time: [id number] OT Time Calculation (min): 31 min  Charges: OT General Charges $OT Visit: 1 Visit OT Treatments $Self Care/Home Management : 23-37 mins   Josiah Lobo, PhD, MS, OTR/L 09/24/22, 12:26 PM

## 2022-09-24 NOTE — TOC Progression Note (Addendum)
Transition of Care [facility]) - Progression Note    Patient Details  Name: [name] MRN: [id number] Date of Birth: 05-15-1956  Transition of Care [facility]) CM/SW Contact  Laurena Slimmer, RN Phone Number: 09/24/2022, 4:00 PM  Clinical Narrative:    Spoke with patient at bedside regarding therapy recommendation. He does not agree to going to a SNF. He stated he had been before and does not wish to go back. He stated his wife would be able to help him at home.   Spoke with patient's wife. She states she already spoke withi patient and he refused with her as well. Patient is active with [facility].  Message sent to Sharmon Revere at [facility].    Expected Discharge Plan: [place] Barriers to Discharge: Continued Medical Work up  Expected Discharge Plan and Services       Living arrangements for the past 2 months: Single Family Home                                       Social Determinants of Health (SDOH) Interventions SDOH Screenings   Food Insecurity: No Food Insecurity (09/19/2022)  Housing: Low Risk  (09/19/2022)  Transportation Needs: No Transportation Needs (09/19/2022)  Utilities: Not At Risk (09/19/2022)  Depression (PHQ2-9): Low Risk  (06/04/2022)  Financial Resource Strain: Low Risk  (09/09/2017)  Physical Activity: Inactive (09/09/2017)  Social Connections: Unknown (09/09/2017)  Stress: No Stress Concern Present (09/09/2017)  Tobacco Use: Low Risk  (09/19/2022)    Readmission Risk Interventions    03/23/2022   12:09 PM 02/23/2022    3:44 PM 01/14/2022    3:23 PM  Readmission Risk Prevention Plan  Transportation Screening Complete Complete Complete  Medication Review Press photographer) Complete  Complete  PCP or Specialist appointment within 3-5 days of discharge Complete Complete   HRI or [place] Complete Complete Complete  SW Recovery Care/Counseling Consult Complete Complete   Palliative Care Screening Not Applicable Not  Applicable Not [place] Not Applicable Not Applicable

## 2022-09-24 NOTE — Progress Notes (Signed)
Progress Note   [name] [id number] DOB: 12/11/1955 DOA: 09/18/2022     5 DOS: the patient was seen and examined on 09/24/2022   Brief hospital course: [name] is a 67 y.o. African-American male with medical history significant for bipolar disorder, type 2 diabetes mellitus without complications, hypertension, TBI, and neurogenic bladder with chronic indwelling Foley cath, who presented to the emergency room with acute onset of hematuria.  Labs revealed hyponatremia of 129 and potassium of 7.1, CO2 16, creatinine 2.69. UA showed 11-20 WBCs with more than 50 RBCs and trace leukocytes. CT scan showed moderate right hydronephrosis, increased since the prior CT. A 3.8 x 2.3 cm mass in the base of the bladder.  Consult from urology and nephrology was obtained.  Potassium normalized after giving fluids.   Patient is also diagnosed with acute on chronic diastolic congestive heart failure, nephrology has started Lasix drip which was completed on 3/23.   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   right hydronephrosis. Patient is improving, gross hematuria resolved.  Discussed with urology, not planning to perform nephrostomy as patient  renal function improving.  Bladder mass most likely is a blood clot, will be followed as outpatient.  Patient can be discharged from urology standpoint.   Acute kidney injury superimposed on chronic kidney disease ([place]) secondary to obstruction. Hyperkalemia secondary to acute renal failure. Hyponatremia Metabolic acidosis. Patient continued to improve, may be able to discharge home tomorrow.  Currently patient refused to go to nursing home, will set up home care.  Anemia of chronic kidney disease. Discussed with nephrology, will give IV iron.  Continue to follow CBC.   Acute on chronic diastolic congestive heart failure.  POA. Reviewed previous echocardiogram performed a year ago, ejection fraction normal, grade 3 diastolic dysfunction.  Condition improved.   Sacral decubitus ulcer Wound care consulted   Dyslipidemia Essential hypertension Resume home treatment.   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   Type 2 diabetes mellitus without complications ([place]) Continue current treatment.   History of DVT. Eliquis can be restarted by urology.   Morbid obesity with BMI 41.24. Diet and exercise.     Subjective:  Patient doing much better, currently denies any short of breath or cough.  Physical Exam: Vitals:   09/23/22 2342 09/24/22 0455 09/24/22 0835 09/24/22 1219  BP: (!) 155/86 119/78 138/79 (!) 108/57  Pulse: (!) 58 (!) 57 (!) 59 (!) 55  Resp: 18 18 16 16   Temp: 97.6 F (36.4 C) 97.6 F (36.4 C) 97.7 F (36.5 C) 97.6 F (36.4 C)  TempSrc:      SpO2: 100% 100% 100% 100%  Weight:      Height:  General exam: Appears calm and comfortable  Respiratory system: Clear to auscultation. Respiratory effort normal. Cardiovascular system: S1 & S2 heard, RRR. No JVD, murmurs, rubs, gallops or clicks. No pedal edema. Gastrointestinal system: Abdomen is nondistended, soft  and nontender. No organomegaly or masses felt. Normal bowel sounds heard. Central nervous system: Alert and oriented. No focal neurological deficits. Extremities: Symmetric 5 x 5 power. Skin: No rashes, lesions or ulcers Psychiatry: Judgement and insight appear normal. Mood & affect appropriate.    Data Reviewed:  Lab results reviewed.  Family Communication:   Disposition: Status is: Inpatient Remains inpatient appropriate because: severity of disease     Time spent: 35 minutes  Author: Sharen Hones, MD 09/24/2022 1:21 PM  For on call review [email or web address].

## 2022-09-24 NOTE — Progress Notes (Signed)
Physical Therapy Treatment Patient Details Name: Johnston Homan MRN: [id number] DOB: Mar 20, 1956 Today's Date: 09/24/2022   History of Present Illness Tarris Bhat is a 67 y.o. African-American male with medical history significant for bipolar disorder, type 2 diabetes mellitus without complications, hypertension, TBI, and neurogenic bladder with chronic indwelling Foley cath, who presented to the emergency room with acute onset of hematuria.    PT Comments    Patient received in bed, he is agreeable to PT session. Still wants to go home. He requires cues for doing bed mobility himself, will look for assistance, but he can do it with heavy use of bed rails. Patient is able to stand with mod assist from elevated bed. He is able to side step about 3 feet along edge of bed with min A and RW. Patient will continue to benefit from skilled PT to improve strength and functional independence.      Recommendations for follow up therapy are one component of a multi-disciplinary discharge planning process, led by the attending physician.  Recommendations may be updated based on patient status, additional functional criteria and insurance authorization.  Follow Up Recommendations  Can patient physically be transported by private vehicle: No    Assistance Recommended at Discharge Intermittent Supervision/Assistance  Patient can return home with the following A lot of help with walking and/or transfers;A lot of help with bathing/dressing/bathroom;Assist for transportation;Direct supervision/assist for medications management   Equipment Recommendations  None recommended by PT    Recommendations for Other Services       Precautions / Restrictions Precautions Precautions: Fall Precaution Comments: multiple falls per wife Restrictions Weight Bearing Restrictions: No     Mobility  Bed Mobility Overal bed mobility: Needs Assistance Bed Mobility: Supine to Sit, Sit to Supine      Supine to sit: Supervision Sit to supine: Supervision   General bed mobility comments: Patient can do more than he lets on. Wife was present yesterday and makes him do it as he wants to go home and he was able to do it with supervision and heavy use of bed rails.    Transfers Overall transfer level: Needs assistance Equipment used: Rolling walker (2 wheels) Transfers: Sit to/from Stand Sit to Stand: Mod assist, From elevated surface           General transfer comment: Mod assist to stand    Ambulation/Gait Ambulation/Gait assistance: Min assist Gait Distance (Feet): 3 Feet Assistive device: Rolling walker (2 wheels)   Gait velocity: decr     General Gait Details: able to side step at edge of bed with min A   Stairs             Wheelchair Mobility    Modified Rankin (Stroke Patients Only)       Balance Overall balance assessment: Needs assistance Sitting-balance support: Feet supported Sitting balance-Leahy Scale: Fair Sitting balance - Comments: requires cues for hand placement to assist   Standing balance support: Bilateral upper extremity supported, During functional activity, Reliant on assistive device for balance Standing balance-Leahy Scale: Poor Standing balance comment: stands with crouched posture                            Cognition Arousal/Alertness: Awake/alert Behavior During Therapy: WFL for tasks assessed/performed Overall Cognitive Status: History of cognitive impairments - at baseline  Exercises      General Comments        Pertinent Vitals/Pain Pain Assessment Pain Assessment: No/denies pain    Home Living                          Prior Function            PT Goals (current goals can now be found in the care plan section) Acute Rehab PT Goals Patient Stated Goal: to return home with wife assist-wife would like for him to go to SNF, but if  he can get in and out of bed and stand with assist she may be able to take him home. PT Goal Formulation: With patient/family Time For Goal Achievement: 10/03/22 Potential to Achieve Goals: Fair Progress towards PT goals: Progressing toward goals    Frequency    Min 2X/week      PT Plan Current plan remains appropriate    Co-evaluation              AM-PAC PT "6 Clicks" Mobility   Outcome Measure  Help needed turning from your back to your side while in a flat bed without using bedrails?: A Lot Help needed moving from lying on your back to sitting on the side of a flat bed without using bedrails?: A Lot Help needed moving to and from a bed to a chair (including a wheelchair)?: A Lot Help needed standing up from a chair using your arms (e.g., wheelchair or bedside chair)?: A Lot Help needed to walk in hospital room?: A Lot Help needed climbing 3-5 steps with a railing? : Total 6 Click Score: 11    End of Session   Activity Tolerance: Patient tolerated treatment well Patient left: in bed;with call bell/phone within reach;with bed alarm set Nurse Communication: Mobility status PT Visit Diagnosis: Other abnormalities of gait and mobility (R26.89);Muscle weakness (generalized) (M62.81)     Time: [id number] PT Time Calculation (min) (ACUTE ONLY): 18 min  Charges:  $Therapeutic Activity: 8-22 mins                     Emika Tiano, PT, GCS 09/24/22,3:24 PM

## 2022-09-24 NOTE — TOC Progression Note (Signed)
Transition of Care [facility]) - Progression Note    Patient Details  Name: Shean Demos MRN: [id number] Date of Birth: 11-14-1955  Transition of Care [facility]) CM/SW Contact  Laurena Slimmer, RN Phone Number: 09/24/2022, 10:27 AM  Clinical Narrative:    [place] PASSAR retrieved.  FL2 completed. Bed search initiated.    Expected Discharge Plan: [place] Barriers to Discharge: Continued Medical Work up  Expected Discharge Plan and Services       Living arrangements for the past 2 months: Single Family Home                                       Social Determinants of Health (SDOH) Interventions SDOH Screenings   Food Insecurity: No Food Insecurity (09/19/2022)  Housing: Low Risk  (09/19/2022)  Transportation Needs: No Transportation Needs (09/19/2022)  Utilities: Not At Risk (09/19/2022)  Depression (PHQ2-9): Low Risk  (06/04/2022)  Financial Resource Strain: Low Risk  (09/09/2017)  Physical Activity: Inactive (09/09/2017)  Social Connections: Unknown (09/09/2017)  Stress: No Stress Concern Present (09/09/2017)  Tobacco Use: Low Risk  (09/19/2022)    Readmission Risk Interventions    03/23/2022   12:09 PM 02/23/2022    3:44 PM 01/14/2022    3:23 PM  Readmission Risk Prevention Plan  Transportation Screening Complete Complete Complete  Medication Review Press photographer) Complete  Complete  PCP or Specialist appointment within 3-5 days of discharge Complete Complete   HRI or [place] Complete Complete Complete  SW Recovery Care/Counseling Consult Complete Complete   Palliative Care Screening Not Applicable Not Applicable Not [place] Not Applicable Not Applicable

## 2022-09-25 DIAGNOSIS — N1330 Unspecified hydronephrosis: Secondary | ICD-10-CM | POA: Diagnosis not present

## 2022-09-25 DIAGNOSIS — N179 Acute kidney failure, unspecified: Secondary | ICD-10-CM | POA: Diagnosis not present

## 2022-09-25 DIAGNOSIS — N189 Chronic kidney disease, unspecified: Secondary | ICD-10-CM | POA: Diagnosis not present

## 2022-09-25 DIAGNOSIS — I5033 Acute on chronic diastolic (congestive) heart failure: Secondary | ICD-10-CM | POA: Diagnosis not present

## 2022-09-25 LAB — CBC
HCT: 25.6 % — ABNORMAL LOW (ref 39.0–52.0)
Hemoglobin: 7.9 g/dL — ABNORMAL LOW (ref 13.0–17.0)
MCH: 26.2 pg (ref 26.0–34.0)
MCHC: 30.9 g/dL (ref 30.0–36.0)
MCV: 84.8 fL (ref 80.0–100.0)
Platelets: 164 10*3/uL (ref 150–400)
RBC: 3.02 MIL/uL — ABNORMAL LOW (ref 4.22–5.81)
RDW: 18.1 % — ABNORMAL HIGH (ref 11.5–15.5)
WBC: 5.4 10*3/uL (ref 4.0–10.5)
nRBC: 0 % (ref 0.0–0.2)

## 2022-09-25 LAB — GLUCOSE, CAPILLARY
Glucose-Capillary: 126 mg/dL — ABNORMAL HIGH (ref 70–99)
Glucose-Capillary: 147 mg/dL — ABNORMAL HIGH (ref 70–99)

## 2022-09-25 LAB — BASIC METABOLIC PANEL
Anion gap: 10 (ref 5–15)
BUN: 56 mg/dL — ABNORMAL HIGH (ref 8–23)
CO2: 21 mmol/L — ABNORMAL LOW (ref 22–32)
Calcium: 8.3 mg/dL — ABNORMAL LOW (ref 8.9–10.3)
Chloride: 105 mmol/L (ref 98–111)
Creatinine, Ser: 2.14 mg/dL — ABNORMAL HIGH (ref 0.61–1.24)
GFR, Estimated: 33 mL/min — ABNORMAL LOW (ref >60)
Glucose, Bld: 135 mg/dL — ABNORMAL HIGH (ref 70–99)
Potassium: 4.6 mmol/L (ref 3.5–5.1)
Sodium: 136 mmol/L (ref 135–145)

## 2022-09-25 MED ORDER — SODIUM BICARBONATE 650 MG PO TABS: 650.0000 mg | ORAL_TABLET | Freq: Two times a day (BID) | ORAL | 0 refills | Status: DC

## 2022-09-25 MED ORDER — FUROSEMIDE 40 MG PO TABS: 40.0000 mg | ORAL_TABLET | Freq: Every day | ORAL | 0 refills | Status: DC

## 2022-09-25 MED ORDER — SENNOSIDES-DOCUSATE SODIUM 8.6-50 MG PO TABS
2.0000 | ORAL_TABLET | Freq: Two times a day (BID) | ORAL | Status: DC
  Administered 2022-09-25: 2 via ORAL
  Filled 2022-09-25: qty 2

## 2022-09-25 NOTE — Discharge Summary (Signed)
Physician Discharge Summary   [name] MRN: [id number] DOB: Feb 03, 1956  Admit date:     09/18/2022  Discharge date: 09/25/22  Discharge Physician: Sharen Hones   PCP: [name], MD   Recommendations at discharge:   Follow-up with PCP in 1 week. Follow-up with urology in 2 weeks. Follow-up with nephrology in 2 weeks.  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 * Hyperkalemia. Hyponatremia. Hospital Course: [name] is a 67 y.o. African-American male with medical history significant for bipolar disorder, type 2 diabetes mellitus without complications, hypertension, TBI, and neurogenic bladder with chronic indwelling Foley cath, who presented to the emergency room with acute onset of hematuria.  Labs revealed hyponatremia of 129 and potassium of 7.1, CO2 16, creatinine 2.69. UA showed 11-20 WBCs with more than 50 RBCs and trace leukocytes. CT scan showed moderate right hydronephrosis, increased since the prior CT. A 3.8 x 2.3 cm mass in the base of the bladder.  Consult from urology and nephrology was obtained.  Potassium normalized after giving fluids.   Patient is also diagnosed with acute on chronic  diastolic congestive heart failure, nephrology has started Lasix drip which was completed on 3/23.    Assessment and Plan:   Gross Hematuria bladder mass.   right hydronephrosis. Patient is improving, gross hematuria resolved.  Discussed with urology, not planning to perform nephrostomy as patient renal function improving.  Bladder mass most likely is a blood clot, will be followed as outpatient.  Patient can be discharged from urology standpoint. Will schedule follow-up with urology as outpatient.  Acute kidney injury superimposed on chronic kidney disease ([place]) secondary to obstruction. Hyperkalemia secondary to acute renal failure. Hyponatremia Metabolic acidosis. Condition much improved, will be followed by nephrology in the near future.   Anemia of chronic kidney disease. Stable, follow-up with nephrology as outpatient.   Acute on chronic diastolic congestive heart failure.  POA. Reviewed previous echocardiogram performed a year ago, ejection fraction normal, grade 3 diastolic dysfunction.  Condition improved.  Continue furosemide 40 mg daily.   Sacral decubitus ulcer Pressure Injury 02/23/22 Buttocks Bilateral Unstageable - Full thickness tissue loss in which the base of the injury is covered by slough (yellow, tan, gray, green or brown) and/or eschar (tan, brown or black) in the wound bed. Reddened white area around (Active)  02/23/22 1515  Location: Buttocks  Location Orientation: Bilateral  Staging: Unstageable - Full thickness tissue loss in which the base of the injury is covered by slough (yellow, tan, gray, green or brown) and/or eschar (tan, brown or black) in the wound bed.  Wound Description (Comments): Reddened white area around the wound. Wound in deep, unstageable, and tunnels.  Present on Admission: Yes     Pressure Injury Heel Right (Active)  Location: Heel  Location Orientation: Right  Staging:   Wound Description (Comments):   Present on Admission:  Yes   Wound care instruction provided, follow with [facility] [place] [facility]    Dyslipidemia Essential hypertension Resume home treatment.   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   Type 2 diabetes mellitus without complications ([place]) Resume home treatment.  History of DVT. Eliquis can be restarted by urology.   Morbid obesity with BMI 41.24. Diet and exercise.           Consultants: Urology and nephrology. Procedures performed: None  Disposition: Home health Diet recommendation:  Discharge Diet Orders (From admission, onward)     Start     Ordered   09/25/22 0000  Diet - low sodium heart healthy        09/25/22 1046           Cardiac diet DISCHARGE MEDICATION: Allergies as of 09/25/2022   No Known Allergies      Medication List     STOP taking these medications    amLODipine-olmesartan 5-20 MG tablet Commonly known as: AZOR   Bactrim DS 800-160 MG tablet Generic drug: sulfamethoxazole-trimethoprim   glipiZIDE 2.5 MG 24 hr tablet Commonly known as: GLUCOTROL XL       TAKE these medications    cyanocobalamin 1000 MCG tablet Commonly known as: VITAMIN B12 Take 3,000 mcg by mouth daily.   Dexcom G7 Receiver Devi UAD for blood sugar monitoring.   Eliquis 5 MG Tabs tablet Generic drug: apixaban Take 5 mg by mouth 2 (two) times daily.   fish oil-omega-3 fatty acids 1000 MG capsule SMARTSIG:1 Capsule(s) By Mouth Daily   furosemide 40 MG tablet Commonly known as: LASIX Take 1 tablet (40 mg total) by mouth daily. Start taking on: September 26, 2022   memantine 10 MG tablet Commonly known as: NAMENDA Take 10 mg by mouth 2 (two) times daily.   Oxcarbazepine 300 MG tablet Commonly known as: TRILEPTAL Take 1 tablet (300 mg total) by mouth 2 (two) times daily.   QUEtiapine 50 MG tablet Commonly known as: SEROQUEL TAKE 1/2 TO 1 (ONE-HALF TO ONE) TABLET BY MOUTH ONCE DAILY PRN  AGITATION    rosuvastatin 20 MG tablet Commonly known as: CRESTOR Take 20 mg by mouth daily.   sodium bicarbonate 650 MG tablet Take 1 tablet (650 mg total) by mouth 2 (two) times daily.   vitamin D3 25 MCG (1000 UT) tablet Generic drug: Cholecalciferol Take 1 tablet (1,000 Units total) by mouth daily.               Discharge Care Instructions  (From admission, onward)           Start     Ordered   09/25/22 0000  Discharge wound care:       Comments: Cleanse wounds to sacrum/buttocks, right heel and right dorsal foot with NS and pat dry. Apply mupirocin ointment to wound bed  Cover with gauze and silicone foam.  Change daily. Also [facility] RN   09/25/22 1046            Follow-up Information     [name], MD Follow up in 1 week(s).   Specialty: Internal Medicine Contact information: [place] [address] [phone]         Abbie Sons, MD Follow up in 2 week(s).   Specialty: Urology Contact information: [place] [address]  [phone]         Lavonia Dana, MD Follow up in 1 month(s).   Specialty: Nephrology Contact information: [address] [place]  [address] [phone]                Discharge Exam: Danley Danker Weights   09/18/22 2309 09/20/22 1824  Weight: 128.7 kg 115.9 kg   General exam: Appears calm and comfortable  Respiratory system: Clear to auscultation. Respiratory effort normal. Cardiovascular system: S1 & S2 heard, RRR. No JVD, murmurs, rubs, gallops or clicks. No pedal edema. Gastrointestinal system: Abdomen is nondistended, soft and nontender. No organomegaly or masses felt. Normal bowel sounds heard. Central nervous system: Alert and oriented. No focal neurological deficits. Extremities: Symmetric 5 x 5 power. Skin: No rashes, lesions or ulcers Psychiatry: Judgement and insight appear normal. Mood & affect appropriate.    Condition  at discharge: good  The results of significant diagnostics from this hospitalization (including imaging, microbiology, ancillary and laboratory) are listed below for reference.   Imaging Studies: DG Chest Port 1 View  Result Date: 09/21/2022 CLINICAL DATA:  Acute on chronic congestive heart failure EXAM: PORTABLE CHEST 1 VIEW COMPARISON:  03/20/2022 FINDINGS: Frontal view of the chest demonstrates an unremarkable cardiac silhouette. No airspace disease, effusion, or pneumothorax. No acute bony abnormality. IMPRESSION: 1. No acute intrathoracic process. Electronically Signed   By: Randa Ngo M.D.   On: 09/21/2022 11:58   US RENAL  Result Date: 09/20/2022 CLINICAL DATA:  Right hydronephrosis EXAM: RENAL / URINARY TRACT ULTRASOUND COMPLETE COMPARISON:  Previous studies including the CT done on 2022-09-21 FINDINGS: Right Kidney: Renal measurements: 9.9 by 6.3 x 5.5 cm = volume: 181.4 mL. There is mild to moderate right hydronephrosis. There is slightly increased cortical echogenicity. There is no perinephric fluid collection. Left Kidney: Renal measurements: 10.1 x 5.2 x 4.5 cm = volume: 123.2 mL. There is no hydronephrosis. There is slightly increased cortical echogenicity. Bladder: Foley catheter is seen in the bladder. Bladder is not distended limiting evaluation. Other: None. IMPRESSION: Mild to moderate right hydronephrosis. There is no hydronephrosis in the left kidney. Electronically Signed   By: Elmer Picker M.D.   On: 09/20/2022 17:52   CT Renal Stone Study  Result Date: September 21, 2022 CLINICAL DATA:  Bladder neck obstruction. EXAM: CT ABDOMEN AND PELVIS WITHOUT CONTRAST TECHNIQUE: Multidetector CT imaging of the abdomen and pelvis was performed following the standard protocol without IV contrast. RADIATION DOSE REDUCTION: This exam was performed according to the departmental dose-optimization program which includes automated exposure control, adjustment of the mA and/or kV according to  patient size and/or use of iterative reconstruction technique. COMPARISON:  CT abdomen pelvis dated 01/12/2022. FINDINGS: Evaluation of this exam is limited in the absence of intravenous contrast. Lower chest: The visualized lung bases are clear. There is coronary vascular calcification. No intra-abdominal free air or free fluid. Hepatobiliary: Subcentimeter hypodense lesion in the right lobe of the liver is too small to characterize but was present on the prior CT, likely a cyst. The liver is otherwise unremarkable. No biliary dilatation. The gallbladder is unremarkable. Pancreas: Unremarkable. No pancreatic ductal dilatation or surrounding inflammatory changes. Spleen: Normal in size without focal abnormality. Adrenals/Urinary Tract: The adrenal glands unremarkable. Mild bilateral renal parenchyma atrophy. There is moderate right hydronephrosis, increased since the prior CT. There is mild right hydroureter. No stone identified. There is no hydronephrosis or nephrolithiasis on the left. There is a 3.8 x 2.3 cm mass in the base of the  bladder which may represent median hypertrophy of the prostate gland but concerning for a bladder urothelial neoplasm. Further evaluation with cystoscopy is recommended. Small pocket of air within the bladder likely introduced via recent catheterization. Stomach/Bowel: Postsurgical changes of the bowel with a left anterior loop colostomy. There is no bowel obstruction or active inflammation. The appendix is normal. Vascular/Lymphatic: Moderate aortoiliac atherosclerotic disease. The IVC is unremarkable. No portal venous gas. There is no adenopathy. Reproductive: The prostate gland is grossly unremarkable. A Foley catheter with compressed balloon in the bulbar urethra. Recommend retraction and repositioning. Other: None Musculoskeletal: There is deep sacral ulcer extending from the skin to the coccyx. There is slight erosion of the tip of the coccyx as seen on the prior CT, likely  chronic. No drainable fluid collection or abscess. Degenerative changes of the spine. No acute osseous pathology. IMPRESSION: 1. Moderate right hydronephrosis, increased since the prior CT. No stone identified. 2. A 3.8 x 2.3 cm mass in the base of the bladder may represent protruding median hypertrophy of the prostate gland but concerning for a bladder urothelial neoplasm. Further evaluation with cystoscopy is recommended. 3. A Foley catheter with compressed balloon in the bulbar urethra. Recommend retraction and repositioning. 4. Deep sacral ulcer extending from the skin to the coccyx. No drainable fluid collection or abscess. 5. Postsurgical changes of the bowel with a left anterior loop colostomy. No bowel obstruction. Normal appendix. 6.  Aortic Atherosclerosis (ICD10-I70.0). Electronically Signed   By: Anner Crete M.D.   On: 09/19/2022 01:25    Microbiology: Results for orders placed or performed during the hospital encounter of 09/18/22  Urine Culture     Status: None   Collection Time: 09/19/22  3:01 AM   Specimen: Urine, Random  Result Value Ref Range Status   Specimen Description   Final    URINE, RANDOM Performed at [facility], [address] [place], [place] [address]    Special Requests   Final    NONE Reflexed from [id number] Performed at [facility], [address] [place], [place] [address]    Culture   Final    NO GROWTH Performed at [facility], [place] [address], [place], [place] [address]    Report Status 09/20/2022 FINAL  Final    Labs: CBC: Recent Labs  Lab 09/18/22 2347 09/19/22 0527 09/21/22 0431 09/22/22 0445 09/23/22 0341 09/24/22 0423 09/25/22 0418  WBC 9.5   < > 5.8 6.2 6.1 6.4 5.4  NEUTROABS 6.9  --   --   --   --   --   --   HGB 8.8*   < > 8.6* 8.0* 8.0* 7.8* 7.9*  HCT 28.5*   < > 27.3* 25.6* 26.1* 25.7* 25.6*  MCV 83.6   < > 82.2 84.2 84.5 84.5 84.8  PLT 214   < > 215 194 187 167 164   < > = values in this interval  not displayed.   Basic Metabolic Panel: Recent Labs  Lab 09/20/22 0438 09/21/22 0431 09/22/22 0445 09/23/22 0341 09/24/22 0423 09/25/22 0418  NA 135 133* 133* 134* 135 136  K 5.3* 4.9 4.6 4.4 4.7 4.6  CL 104 107 109 106 109 105  CO2 19* 19* 19* 19* 21* 21*  GLUCOSE 130* 129* 141* 134* 128* 135*  BUN 43* 53* 58* 58* 56* 56*  CREATININE 2.79* 3.01* 2.81* 2.58* 2.25* 2.14*  CALCIUM 8.9 8.3* 8.1* 8.2* 8.2* 8.3*  MG 1.9 1.9 2.0  --  2.3  --   PHOS 3.7 4.5 4.8*  --   --   --    Liver Function Tests: No results for input(s): "AST", "ALT", "ALKPHOS", "BILITOT", "PROT", "ALBUMIN" in the last 168 hours. CBG: Recent Labs  Lab 09/24/22 0835 09/24/22 1219 09/24/22 1633 09/24/22 2103 09/25/22 0847  GLUCAP 125* 132* 129* 135* 126*    Discharge time spent: greater than 30 minutes.  Signed: Sharen Hones, MD [facility] 09/25/2022

## 2022-09-25 NOTE — Progress Notes (Signed)
[facility]  ROUNDING NOTE   Subjective:   Patient seen sitting up in bed, alert and oriented States he feels well today Remains on room air Denies any pain or discomfort  Creatinine 2.14 Urine output 1. 3 L.  Objective:  Vital signs in last 24 hours:  Temp:  [97.6 F (36.4 C)-98.6 F (37 C)] 97.8 F (36.6 C) (03/27 0844) Pulse Rate:  [54-61] 54 (03/27 0844) Resp:  [16-18] 16 (03/27 0844) BP: (108-130)/(48-69) 120/69 (03/27 0844) SpO2:  [98 %-100 %] 98 % (03/27 0844)  Weight change:  Filed Weights   09/18/22 2309 09/20/22 1824  Weight: 128.7 kg 115.9 kg    Intake/Output: I/O last 3 completed shifts: In: 377 [P.O.:360; I.V.:17] Out: 1800 [Urine:1800]   Intake/Output this shift:  No intake/output data recorded.  Physical Exam: General: NAD  Head: Normocephalic, atraumatic. Moist oral mucosal membranes  Eyes: Anicteric  Lungs:  Diminished bases, normal effort  Heart: Regular rate and rhythm  Abdomen:  Soft, nontender, distended  Extremities:  Trace peripheral edema.  Neurologic: Alert and oriented, moving all four extremities  Skin: No lesions  Access: None    Basic Metabolic Panel: Recent Labs  Lab 09/20/22 0438 09/21/22 0431 09/22/22 0445 09/23/22 0341 09/24/22 0423 09/25/22 0418  NA 135 133* 133* 134* 135 136  K 5.3* 4.9 4.6 4.4 4.7 4.6  CL 104 107 109 106 109 105  CO2 19* 19* 19* 19* 21* 21*  GLUCOSE 130* 129* 141* 134* 128* 135*  BUN 43* 53* 58* 58* 56* 56*  CREATININE 2.79* 3.01* 2.81* 2.58* 2.25* 2.14*  CALCIUM 8.9 8.3* 8.1* 8.2* 8.2* 8.3*  MG 1.9 1.9 2.0  --  2.3  --   PHOS 3.7 4.5 4.8*  --   --   --      Liver Function Tests: No results for input(s): "AST", "ALT", "ALKPHOS", "BILITOT", "PROT", "ALBUMIN" in the last 168 hours. No results for input(s): "LIPASE", "AMYLASE" in the last 168 hours. No results for input(s): "AMMONIA" in the last 168 hours.  CBC: Recent Labs  Lab 09/18/22 2347 09/19/22 0527 09/21/22 0431  09/22/22 0445 09/23/22 0341 09/24/22 0423 09/25/22 0418  WBC 9.5   < > 5.8 6.2 6.1 6.4 5.4  NEUTROABS 6.9  --   --   --   --   --   --   HGB 8.8*   < > 8.6* 8.0* 8.0* 7.8* 7.9*  HCT 28.5*   < > 27.3* 25.6* 26.1* 25.7* 25.6*  MCV 83.6   < > 82.2 84.2 84.5 84.5 84.8  PLT 214   < > 215 194 187 167 164   < > = values in this interval not displayed.     Cardiac Enzymes: No results for input(s): "CKTOTAL", "CKMB", "CKMBINDEX", "TROPONINI" in the last 168 hours.  BNP: Invalid input(s): "POCBNP"  CBG: Recent Labs  Lab 09/24/22 0835 09/24/22 1219 09/24/22 1633 09/24/22 2103 09/25/22 0847  GLUCAP 125* 132* 129* 135* 126*     Microbiology: Results for orders placed or performed during the hospital encounter of 09/18/22  Urine Culture     Status: None   Collection Time: 09/19/22  3:01 AM   Specimen: Urine, Random  Result Value Ref Range Status   Specimen Description   Final    URINE, RANDOM Performed at [facility], [address] [place], [place] [address]    Special Requests   Final    NONE Reflexed from [id number] Performed at [facility], [address], [place], [place] [address]    Culture   Final    NO GROWTH Performed at [facility], [place] [address], [place], [place] [address]    Report Status 09/20/2022 FINAL  Final    Coagulation Studies: No results for input(s): "LABPROT", "INR" in the last 72 hours.  Urinalysis: No results for input(s): "COLORURINE", "LABSPEC", "PHURINE", "GLUCOSEU", "HGBUR", "BILIRUBINUR", "KETONESUR", "PROTEINUR", "UROBILINOGEN", "NITRITE", "LEUKOCYTESUR" in the last 72 hours.  Invalid input(s): "APPERANCEUR"     Imaging: No results found.   Medications:    sodium chloride irrigation Stopped (09/19/22 0340)    apixaban  5 mg Oral BID   Chlorhexidine Gluconate Cloth  6 each Topical Daily   cholecalciferol  1,000 Units Oral Daily   cyanocobalamin  3,000 mcg Oral Daily   furosemide  40 mg Oral Daily    insulin aspart  0-5 Units Subcutaneous QHS   insulin aspart  0-9 Units Subcutaneous TID WC   memantine  10 mg Oral BID   mupirocin ointment   Topical Daily   omega-3 acid ethyl esters  1 capsule Oral Daily   Oxcarbazepine  300 mg Oral BID   rosuvastatin  20 mg Oral Daily   senna-docusate  2 tablet Oral BID   sodium bicarbonate  650 mg Oral BID   acetaminophen **OR** acetaminophen, magnesium hydroxide, ondansetron **OR** ondansetron (ZOFRAN) IV, mouth rinse, QUEtiapine, traZODone  Assessment/ Plan:  [name] is a 67 y.o.  male with frontotemporal dementia, bipolar disorder, traumatic brain injury, PE/DVT, diabetes mellitus type II, hypertension, neurogenic bladder who was admitted to [facility] on 09/18/2022 for Hyperkalemia [E87.5] Bladder mass [N32.89] AKI (acute kidney injury) ([place]) [N17.9] Hematuria [R31.9] Hematuria, unspecified type [R31.9] Pressure injury of skin of sacral region, unspecified injury stage [L89.159]   Acute kidney injury on chronic kidney disease stage IIIB: baseline creatinine of 1.72, GFR of 43 on 03/22/22. Secondary to obstructive uropathy and possibly acute cardiorenal syndrome secondary to acute exacerbation of congestive heart failure.  Renal function continues to slowly improve. Continue to hold Bactrim and olmasartan Oral furosemide 40 mg daily with adequate urine output of 1.3 L.  Lab Results  Component Value Date   CREATININE 2.14 (H) 09/25/2022   CREATININE 2.25 (H) 09/24/2022   CREATININE 2.58 (H) 09/23/2022    Intake/Output Summary (Last 24 hours) at 09/25/2022 1140 Last data filed at 09/25/2022 0439 Gross per 24 hour  Intake 136.97 ml  Output 1300 ml  Net -1163.03 ml    Hyponatremia: hypervolemic. Secondary to congestive heart failure.  Corrected   Hyperkalemia: with acute kidney injury and chronic Bactrim use.  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  Patient received IV iron on 09/24/2022.   Hematuria: originally gross urine now clear. Urology will defer PCN placement as patient is asymptomatic.  They will continue to follow patient for lower bladder mass at discharge.   Acute exacerbation of chronic diastolic congestive heart failure. Respiratory and volume status stable.  Remains on room air    LOS: 6 [place] 3/27/202411:40 AM

## 2022-09-25 NOTE — TOC Transition Note (Addendum)
Transition of Care [facility]) - CM/SW Discharge Note   Patient Details  Name: Matis Demick MRN: [id number] Date of Birth: [birth year]  Transition of Care [facility]) CM/SW Contact:  Laurena Slimmer, RN Phone Number: 09/25/2022, 11:45 AM   Clinical Narrative:    Patient discharging today.  Wife notified. She will transport patient home, and has assistance getting patient in the house.  Cheryl from [employer] notified.  TOC signing off.     Barriers to Discharge: Continued Medical Work up   Patient Goals and CMS Choice CMS Medicare.gov Compare Post Acute Care list provided to:: Patient Choice offered to / list presented to : Patient  Discharge Placement                         Discharge Plan and Services Additional resources added to the After Visit Summary for                                       Social Determinants of Health (SDOH) Interventions SDOH Screenings   Food Insecurity: No Food Insecurity (09/19/2022)  Housing: Low Risk  (09/19/2022)  Transportation Needs: No Transportation Needs (09/19/2022)  Utilities: Not At Risk (09/19/2022)  Depression (PHQ2-9): Low Risk  (06/04/2022)  Financial Resource Strain: Low Risk  (09/09/2017)  Physical Activity: Inactive (09/09/2017)  Social Connections: Unknown (09/09/2017)  Stress: No Stress Concern Present (09/09/2017)  Tobacco Use: Low Risk  (09/19/2022)     Readmission Risk Interventions    03/23/2022   12:09 PM 02/23/2022    3:44 PM 01/14/2022    3:23 PM  Readmission Risk Prevention Plan  Transportation Screening Complete Complete Complete  Medication Review Press photographer) Complete  Complete  PCP or Specialist appointment within 3-5 days of discharge Complete Complete   HRI or [place] Complete Complete Complete  SW Recovery Care/Counseling Consult Complete Complete   Palliative Care Screening Not Applicable Not Applicable Not [place] Not Applicable Not Applicable

## 2022-09-25 NOTE — TOC Progression Note (Signed)
Transition of Care [facility]) - Progression Note    Patient Details  Name: [name] MRN: [id number] Date of Birth: May 03, 1956  Transition of Care [facility]) CM/SW Contact  Laurena Slimmer, RN Phone Number: 09/25/2022, 10:36 AM  Clinical Narrative:    Per Sharmon Revere at [facility]. Patient is active for PT/ RN. Message sent to MD requesting RN be added to order for resumption of care.    Expected Discharge Plan: [place] Barriers to Discharge: Continued Medical Work up  Expected Discharge Plan and Services       Living arrangements for the past 2 months: Single Family Home                                       Social Determinants of Health (SDOH) Interventions SDOH Screenings   Food Insecurity: No Food Insecurity (09/19/2022)  Housing: Low Risk  (09/19/2022)  Transportation Needs: No Transportation Needs (09/19/2022)  Utilities: Not At Risk (09/19/2022)  Depression (PHQ2-9): Low Risk  (06/04/2022)  Financial Resource Strain: Low Risk  (09/09/2017)  Physical Activity: Inactive (09/09/2017)  Social Connections: Unknown (09/09/2017)  Stress: No Stress Concern Present (09/09/2017)  Tobacco Use: Low Risk  (09/19/2022)    Readmission Risk Interventions    03/23/2022   12:09 PM 02/23/2022    3:44 PM 01/14/2022    3:23 PM  Readmission Risk Prevention Plan  Transportation Screening Complete Complete Complete  Medication Review Press photographer) Complete  Complete  PCP or Specialist appointment within 3-5 days of discharge Complete Complete   HRI or [place] Complete Complete Complete  SW Recovery Care/Counseling Consult Complete Complete   Palliative Care Screening Not Applicable Not Applicable Not [place] Not Applicable Not Applicable

## 2022-09-26 ENCOUNTER — Telehealth: Payer: Self-pay

## 2022-09-26 NOTE — Telephone Encounter (Signed)
Called patient to set up surgery since being discharged home yesterday. Left a detailed message to call back. Will try again.

## 2022-09-30 ENCOUNTER — Telehealth: Payer: Self-pay

## 2022-09-30 NOTE — Telephone Encounter (Signed)
I spoke with Mrs. Thissen, patients spouse, legal guardian . We have discussed possible surgery dates and Friday April 26th, 2024 was agreed upon by all parties. Patient given information about surgery date, what to expect pre-operatively and post operatively.  We discussed that a Pre-Admission Testing office will be calling to set up the pre-op visit that will take place prior to surgery, and that these appointments are typically done over the phone with a Pre-Admissions RN. Informed patient that our office will communicate any additional care to be provided after surgery. Patients questions or concerns were discussed during our call. Advised to call our office should there be any additional information, questions or concerns that arise. Patient verbalized understanding.

## 2022-09-30 NOTE — Progress Notes (Addendum)
  Phone Number: [phone] for Surgical Coordinator Fax Number: [phone]  REQUEST FOR SURGICAL CLEARANCE       Date: Date: 09/30/22  Faxed to: Dr. Johnn Hai, MD  Surgeon: Dr. 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  Risk Assessment:    Low   []       Moderate   []     High   []           This patient is optimized for surgery  YES []       NO   []    I recommend further assessment/workup prior to surgery. YES []      NO  []   Appointment scheduled for: _______________________   Further recommendations: ____________________________________     Physician Signature:__________________________________   Printed Name: ________________________________________   Date: _________________

## 2022-09-30 NOTE — Progress Notes (Signed)
   [facility] Surgical Posting Form  Surgery Date: Date: 10/25/2022  Surgeon: Dr. 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

## 2022-10-01 ENCOUNTER — Encounter: Payer: Medicare Other | Attending: Physician Assistant | Admitting: Physician Assistant

## 2022-10-01 DIAGNOSIS — L89154 Pressure ulcer of sacral region, stage 4: Secondary | ICD-10-CM | POA: Diagnosis not present

## 2022-10-01 DIAGNOSIS — L24A0 Irritant contact dermatitis due to friction or contact with body fluids, unspecified: Secondary | ICD-10-CM | POA: Diagnosis not present

## 2022-10-01 DIAGNOSIS — I10 Essential (primary) hypertension: Secondary | ICD-10-CM | POA: Diagnosis not present

## 2022-10-01 DIAGNOSIS — E11622 Type 2 diabetes mellitus with other skin ulcer: Secondary | ICD-10-CM | POA: Insufficient documentation

## 2022-10-01 DIAGNOSIS — L89613 Pressure ulcer of right heel, stage 3: Secondary | ICD-10-CM | POA: Insufficient documentation

## 2022-10-01 DIAGNOSIS — L98412 Non-pressure chronic ulcer of buttock with fat layer exposed: Secondary | ICD-10-CM | POA: Insufficient documentation

## 2022-10-01 DIAGNOSIS — Z86718 Personal history of other venous thrombosis and embolism: Secondary | ICD-10-CM | POA: Insufficient documentation

## 2022-10-01 DIAGNOSIS — E11621 Type 2 diabetes mellitus with foot ulcer: Secondary | ICD-10-CM | POA: Insufficient documentation

## 2022-10-01 DIAGNOSIS — L89623 Pressure ulcer of left heel, stage 3: Secondary | ICD-10-CM | POA: Diagnosis not present

## 2022-10-01 DIAGNOSIS — M6281 Muscle weakness (generalized): Secondary | ICD-10-CM | POA: Insufficient documentation

## 2022-10-01 DIAGNOSIS — Z7901 Long term (current) use of anticoagulants: Secondary | ICD-10-CM | POA: Diagnosis not present

## 2022-10-01 DIAGNOSIS — Z87820 Personal history of traumatic brain injury: Secondary | ICD-10-CM | POA: Diagnosis not present

## 2022-10-01 NOTE — Progress Notes (Addendum)
TOWNSEND, CUDWORTH ([id number]) [id number] Page 1 of 8 Visit Report for 10/01/2022 Chief Complaint Document Details Patient Name: Date of Service: [name] [facility] 10/01/2022 8:45 A M Medical Record Number: [id number] Patient Account Number: [id number] Date of Birth/Sex: Treating RN: 1956-01-15 (66 y.o. [name]) [name] Primary Care Provider: Dione Booze Other Clinician: Referring Provider: Treating Provider/Extender: [name] in Treatment: 60 Information Obtained from: Patient Chief Complaint Sacral, right gluteal, and bilateral heel ulcers Electronic Signature(s) Signed: 10/01/2022 8:55:39 AM By: Lenda Kelp PA-C Entered By: Lenda Kelp on 10/01/2022 08:55:39 -------------------------------------------------------------------------------- HPI Details Patient Name: Date of Service: [name] [facility] 10/01/2022 8:45 A M Medical Record Number: [id number] Patient Account Number: [id number] Date of Birth/Sex: Treating RN: 13-Jul-1955 (66 y.o. [name] Primary Care Provider: Dione Booze Other Clinician: Referring Provider: Treating Provider/Extender: [name] in Treatment: 64 History of Present Illness HPI Description: 05/29/2021 this is a patient who presents today for initial inspection here in the clinic concerning wounds that he has over the right heel and the sacral region. Unfortunately the sacral wound is starting to spread off to the right gluteal location due to how he sits always leaning towards the right side in his chair. His wife is present she is the primary caregiver though she is not home with him all the time she does have to work. She does do an excellent job however it appears to be in trying to keep things under good control for him. The patient is not able to change positions himself nor walk by himself so he is pretty much at the mercy of the position  he is putting when she is gone and this tends to be his chair which she sits and most of the day. Obviously this I think is the main culprit for what is going on currently. It was actually in January 2020 when the sacral wound started. It was in September 2022 when the wound started to spread more to the right gluteal location. Subsequently in August 2022 is when he had been in a skilled nursing facility and the heel started to give him trouble as well. That does not seem to be doing nearly as poorly as the sacral region. He was hospitalized in October 2022 secondary to sepsis and this was in regard to the foot and was sent to skilled nursing again he is now back at home. He did have a wound VAC for the sacral wound over the summer 2022 but being in and out of facilities this ended up getting sent back. The patient does have [facility] home health that comes out 1 time per week to help with care. His most recent hemoglobin A1c was 6.9 in August 2022. 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 s. 07/24/2021 upon evaluation today patient appears to be doing decently well in regard to the pressure ulcer on the right heel as well as the sacral region. In general I think you are making some progress here which is great news. Overall the heel unfortunately had already closed previously when we saw him although it apparently reopened when he was working with physical therapy according to his wife. The area in the sacral region is doing well and looks clean there is still some depth here but I still think it would  be difficult to wound VAC this region. His wife does an awesome job taking care of him. Is been so long since we have seen him because he has been in the hospital to be honest. 08/07/2021 upon  evaluation today patient appears to be doing well at this time. Fortunately I do not see any signs of active infection locally or systemically at this time which is great news. No fevers, chills, nausea, vomiting, or diarrhea. Unfortunately after I saw him on the 24th he actually ended up in the hospital in the 27th due to being septic. This was not due to the wounds but after looking at records actually due to a UTI. Fortunately he is doing much better and very happy in that regard. I do not see any signs of infection locally nor systemically at this time. 08/21/2021 upon evaluation today patient appears to be doing well with regard to his wound. Fortunately I think that the sacral region is doing decently well at this point which is great news. With regard to the foot this also does have some slough noted but I feel like we are making progress here. He does have some irritation around the right upper thigh/gluteal region. I feel like it is more towards the thigh. Nonetheless this does appear to be pressure related he spends a lot of time sitting up his caregiver states he really will not get in the bed and stay there like he should. 09/04/2021 upon evaluation patient appears to be doing decently well today in regard to the wound on his heel as well as the sacral area. I am actually very pleased with both and how things are appearing currently. There does not appear to be any evidence of active infection I think that his caregiver is doing an awesome job with regard to the wound care here. She is present today as well and I did discuss this with her. 09/25/2021 upon evaluation today patient appears to be doing well with regard to his wound on the sacral region. His right heel is also doing well. Unfortunately he has a new area on his left heel which does appear to be pressure injury. I do not see any evidence of active infection locally or systemically which is great news no fevers, chills, nausea,  vomiting, or diarrhea. Readmission: RAEF, SPRIGG ([id number]) [id number] Page 2 of 8 11-22-2021 upon evaluation today patient presents for reevaluation here in the clinic concerning issues with his wounds. Since have last seen him he was in the hospital and then subsequently ended up in a skilled nursing facility. Subsequently period of time in the facility he actually had a breakdown in the right thigh location which has been an issue for him. Fortunately I do not see any evidence of active infection locally or systemically which is great news and I am very pleased in that regard. Nonetheless he does have still the wounds on both heels as well as the wound in the sacral area and the new area in the right upper thigh/gluteal region. 12-13-2021 upon evaluation today patient appears to be doing well currently in regard to his wounds. The left heel is going to require some sharp debridement. Fortunately the right heel seems to be doing quite well. Overall I think his gluteal region as well as sacral region also are doing well. 01-10-2022 upon evaluation today patient appears to be doing better in regard to some areas unfortunately his sacral area is not doing as well. It seems of gotten worse his  caregiver states when he was in the hospital recently they really did not offloading. Fortunately I do not see any evidence of active infection locally or systemically at this time. 02-01-2022 upon evaluation today patient appears to be doing well currently in regard to his wounds. He is going require some sharp debridement of clearway some of the necrotic debris. Fortunately I do not see any evidence of active infection locally or systemically which is great news. No fevers, chills, nausea, vomiting, or diarrhea. 02-22-2022 upon evaluation today patient appears to be doing poorly in regard to his right heel the left heel is completely closed. Unfortunately I think he is  got some deep tissue injury to this area I am going to do a culture at this spot. With that being said he is having 2 other significant issues in regard to the left thigh this is showing signs of cellulitis all the way down into the thigh and was seeing definite temperature difference in the left thigh versus the right thigh when feeling. It also is of note that the patient is also having some issues here with trouble in regard to having fevers his wife has been giving him Tylenol and ibuprofen to help keep the fevers under control but he has been as high as 102 this is pretty much been going on since Monday or Tuesday of this week. Nonetheless I am concerned I am not exactly sure where the cellulitis on the left thigh is coming from not sure if this is emanating from his sacral region that is a possibility or if there is something completely different going on either way with the fevers I am more worried that he needs to go get this checked out as soon as possible. 03-08-2022 upon evaluation today patient appears to be doing well with regard to his heel as well as sacral region. Both are showing signs of significant improvement compared to the last time I saw him. Overall I think we are in a much better spot 04-04-2022 upon evaluation today patient appears to be doing about the same in regard to his wounds. I do not see any signs of improvement nor worsening at this point. Fortunately there is no evidence of active infection at this time. He is not staying off of this is much as he needs to in fact is not even sleeping in the hospital bed with the alternating air mattress. 04-16-2022 upon evaluation today patient appears to be doing well currently in regard to his wound. He has been tolerating the dressing changes without complication. Fortunately I do not see any evidence of infection. The heel actually looks quite well the sacral area actually is still quite deep. From my questioning and discussion  with him today I do not believe that he is really staying off of this as much as he should. He seemed a little uncomfortable during that portion of the conversation today. I think that is contributing quite a bit to his lack of progress with regard to healing. 11/7; patient with decubitus ulcers on his right heel as well as his lower sacrum. We have been using Hydrofera Blue. He has home health predominantly for Foley catheter management. There have been some issues getting the Hydrofera Blue through home health but apparently the patient's wife has some supplies and is managing to get these changed.They come every 3 weeks because of transportation difficulties 05-28-2022 upon evaluation today patient appears to be doing decently well currently in regard to his wounds. He still  has a depth to the wound in the sacral region but again this does seem to be a little bit less than previous. He has been using Hydrofera Blue both on this in the heel ulcer. Both are going to require some cleaning today sharp debridement on the heel I think is cleaning with saline gauze and the sterile Q-tip is probably sufficient for the sacral area which does not appear to have too much necrotic tissue. 06-18-2022 upon evaluation today patient's wounds are showing signs of doing decently well. Fortunately I do not see any signs of active infection locally nor systemically at this time which is great news. No fevers, chills, nausea, vomiting, or diarrhea. 07-15-2022 upon evaluation today patient appears to be doing well currently in regard to his wound. He has been tolerating the dressing changes without complication. Fortunately I think that the heel is doing well except for where it is causing pressure to the heel posteriorly and this apparently is due to some kind of exercise machine that his children got him. Nonetheless I am not sure that that is going to be helpful especially if it is causing discomfort and pain.  And especially if it is making the wound worse which is an even bigger deal. With regard to the patient's sacral region this is draining quite significantly. I do think a wound VAC would be ideal here and I would recommend that we go ahead and try to see about getting this ordered for him as soon as possible. The patient's wife and the patient are in agreement with this plan. 08-05-2022 upon evaluation today patient appears to be doing well currently in regard to both his heel and his sacral region. Unfortunately he is not eligible for a wound VAC as he is previously already undergone all the treatment that is allowable under Medicare guidelines for his sacral wound. This is quite unfortunate as I felt like that could have potentially help this speed this up but nonetheless it is where we stand. For that reason I think the [facility] is probably still the best way to continue at this point and I discussed that with the patient and his wife today. 08-26-2022 upon evaluation today patient appears to be doing well currently in regard to his wound. He on the heel of his looking much better I did have to perform some debridement here. In regard to the sacral area no debridement was necessary it looks to clean this has been although there was a lot of Hydrofera Blue and there much more than is probably necessary. 09-15-2021 upon evaluation today patient appears to be doing well currently in regard to his heel ulcer which is looking okay although the gluteal region specifically the sacral wound is showing signs of some issues there. Fortunately I do not see any evidence of infection significantly to the heel although I am not so sure about the sacral region. I think he probably would benefit from initiation of antibiotics. 10-01-2022 upon evaluation today patient unfortunately had a hospital stay where he ended up having his catheter which had been apparently blowing up into the urethra. With that being said  he had traumatic bleeding and subsequently ended up in the hospital I did review that discharge summary as well. The good news is he is doing somewhat better at this point but he did end up laying on his backside a lot causing the wound on the sacral area to actually be worse today compared to where we have been previous.  This is not good and not the direction we want to go. Again the depth is what was actually more significant. Electronic Signature(s) Signed: 10/01/2022 9:29:36 AM By: Lenda Kelp PA-C Entered By: Lenda Kelp on 10/01/2022 09:29:36 -------------------------------------------------------------------------------- Physical Exam Details Patient Name: Date of Service: [name] [facility] 10/01/2022 8:45 A M Medical Record Number: [id number] Patient Account Number: [id number] Date of Birth/Sex: Treating RN: 12-Dec-1955 (66 y.o. [name]) [name] [place], [place] ([id number]) [phone].pdf Page 3 of 8 Primary Care Provider: Dione Booze Other Clinician: Referring Provider: Treating Provider/Extender: [name] in Treatment: 44 Constitutional Obese and well-hydrated in no acute distress. Respiratory normal breathing without difficulty. Psychiatric this patient is able to make decisions and demonstrates good insight into disease process. Alert and Oriented x 3. pleasant and cooperative. Notes Upon inspection patient's wound bed actually showed signs again of being clean both the heel as well as the sacral area but again in the sacral region this is not doing nearly as good he needs to stay off of this cirrhosis can continue to get worse I had a long discussion with him on offloading today as well. Electronic Signature(s) Signed: 10/01/2022 9:29:57 AM By: Lenda Kelp PA-C Entered By: Lenda Kelp on 10/01/2022 09:29:57 -------------------------------------------------------------------------------- Physician  Orders Details Patient Name: Date of Service: [name] [facility] 10/01/2022 8:45 A M Medical Record Number: [id number] Patient Account Number: [id number] Date of Birth/Sex: Treating RN: Jan 24, 1956 (66 y.o. [name]) [name] Primary Care Provider: Dione Booze Other Clinician: Referring Provider: Treating Provider/Extender: [name] in Treatment: 5 Verbal / Phone Orders: No Diagnosis Coding ICD-10 Coding Code Description L89.613 Pressure ulcer of right heel, stage 3 L89.623 Pressure ulcer of left heel, stage 3 L89.154 Pressure ulcer of sacral region, stage 4 L24.A0 Irritant contact dermatitis due to friction or contact with body fluids, unspecified L98.412 Non-pressure chronic ulcer of buttock with fat layer exposed M62.81 Muscle weakness (generalized) F31.9 Bipolar disorder, unspecified E11.622 Type 2 diabetes mellitus with other skin ulcer I10 Essential (primary) hypertension Z79.01 Long term (current) use of anticoagulants 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 Home Health [facility] for wound care. May utilize formulary equivalent dressing for wound treatment orders unless otherwise specified. Home Health Nurse may visit PRN to address patients wound care needs. Beverly Gust [phone] **Please direct any NON-WOUND related issues/requests for orders to patient's Primary Care Physician. **If current dressing causes regression in wound condition, may D/C ordered dressing product/s and apply Normal Saline Moist Dressing daily until next Wound Healing Center or Other MD appointment. **Notify Wound Healing Center of regression in wound condition at [phone]. Bathing/ Shower/ Hygiene No tub bath. Anesthetic (Use 'Patient Medications' Section for Anesthetic Order Entry) Lidocaine applied to wound bed Edema Control - Lymphedema / Segmental Compressive Device /  Other Elevate, Exercise Daily and A void Standing for Long Periods of Time. Elevate legs to the level of the heart and pump ankles as often as possible Elevate leg(s) parallel to the floor when sitting. DO YOUR BEST to sleep in the bed at night. DO NOT sleep in your recliner. Long hours of sitting in a recliner leads to swelling of the legs and/or potential wounds on your backside. [name], [name] ([id number]) [id number] Page 4 of 8 Off-Loading Gel wheelchair cushion Low air-loss mattress (Group 2) - Sleep in bed every night. Turn and reposition every 2 hours - keep pressure off of the sacrum and  heels wounds Other: - PRAFO boot in bed keep pressure off of sacrum/gluteus and heels- Additional Orders / Instructions Follow Nutritious Diet and Increase Protein Intake Wound Treatment Wound #5 - Calcaneus Wound Laterality: Right Prim Dressing: Hydrofera Blue Ready Transfer Foam, 2.5x2.5 (in/in) ary 3 x Per Week/30 Days Discharge Instructions: cut to size of wound Secondary Dressing: (BORDER) Zetuvit Plus SILICONE BORDER Dressing 4x4 (in/in) 3 x Per Week/30 Days Wound #7 - Sacrum Topical: calmoseptine 1 x Per Day/30 Days Discharge Instructions: apply to excorated areas Prim Dressing: Hydrofera Blue Ready Transfer Foam, 4x5 (in/in) ary 1 x Per Day/30 Days Discharge Instructions: pack lightly into wound Secondary Dressing: (BORDER) Zetuvit Plus SILICONE BORDER Dressing 4x4 (in/in) 1 x Per Day/30 Days Electronic Signature(s) Signed: 10/01/2022 4:11:34 PM By: [name] Pax RN Signed: 10/04/2022 8:47:01 AM By: Lenda Kelp PA-C Entered By: [name] on 10/01/2022 09:25:17 -------------------------------------------------------------------------------- Problem List Details Patient Name: Date of Service: [name] [facility] 10/01/2022 8:45 A M Medical Record Number: [id number] Patient Account Number: [id number] Date of Birth/Sex: Treating RN: 1955/08/25  (66 y.o. [name] Primary Care Provider: Dione Booze Other Clinician: Referring Provider: Treating Provider/Extender: [name] in Treatment: 44 Active Problems ICD-10 Encounter Code Description Active Date MDM Diagnosis L89.613 Pressure ulcer of right heel, stage 3 11/22/2021 No Yes L89.623 Pressure ulcer of left heel, stage 3 11/22/2021 No Yes L89.154 Pressure ulcer of sacral region, stage 4 11/22/2021 No Yes L24.A0 Irritant contact dermatitis due to friction or contact with body fluids, 11/22/2021 No Yes unspecified L98.412 Non-pressure chronic ulcer of buttock with fat layer exposed 11/22/2021 No Yes M62.81 Muscle weakness (generalized) 11/22/2021 No Yes [name], [name] ([id number]) [id number] Page 5 of 8 F31.9 Bipolar disorder, unspecified 11/22/2021 No Yes E11.622 Type 2 diabetes mellitus with other skin ulcer 11/22/2021 No Yes I10 Essential (primary) hypertension 11/22/2021 No Yes Z79.01 Long term (current) use of anticoagulants 11/22/2021 No Yes 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 11/22/2021 No Yes Inactive Problems Resolved Problems Electronic Signature(s) Signed: 10/01/2022 8:55:35 AM By: Lenda Kelp PA-C Entered By: Lenda Kelp on 10/01/2022 08:55:34 -------------------------------------------------------------------------------- Progress Note Details Patient Name: Date of Service: [name] [facility] 10/01/2022 8:45 A M Medical Record Number: [id number] Patient Account Number: [id number] Date of Birth/Sex: Treating RN: 1955/11/26 (66 y.o. [name]) [name] Primary Care Provider: Dione Booze Other Clinician: Referring Provider: Treating Provider/Extender: [name] in Treatment: 44 Subjective Chief Complaint Information obtained from Patient Sacral, right gluteal, and bilateral heel ulcers History of Present Illness (HPI) 05/29/2021 this is a patient  who presents today for initial inspection here in the clinic concerning wounds that he has over the right heel and the sacral region. Unfortunately the sacral wound is starting to spread off to the right gluteal location due to how he sits always leaning towards the right side in his chair. His wife is present she is the primary caregiver though she is not home with him all the time she does have to work. She does do an excellent job however it appears to be in trying to keep things under good control for him. The patient is not able to change positions himself nor walk by himself so he is pretty much at the mercy of the position he is putting when she is gone and this tends to be his chair which she sits and most of the day. Obviously this I think is the main culprit for what is going on currently. It  was actually in January 2020 when the sacral wound started. It was in September 2022 when the wound started to spread more to the right gluteal location. Subsequently in August 2022 is when he had been in a skilled nursing facility and the heel started to give him trouble as well. That does not seem to be doing nearly as poorly as the sacral region. He was hospitalized in October 2022 secondary to sepsis and this was in regard to the foot and was sent to skilled nursing again he is now back at home. He did have a wound VAC for the sacral wound over the summer 2022 but being in and out of facilities this ended up getting sent back. The patient does have [facility] home health that comes out 1 time per week to help with care. His most recent hemoglobin A1c was 6.9 in August 2022. 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 s. 07/24/2021 upon evaluation today patient appears to be doing decently well in  regard to the pressure ulcer on the right heel as well as the sacral region. In general I think you are making some progress here which is great news. Overall the heel unfortunately had already closed previously when we saw him although it apparently reopened when he was working with physical therapy according to his wife. The area in the sacral region is doing well and looks clean there is still some depth here but I still think it would be difficult to wound VAC this region. His wife does an awesome job taking care of him. Is been so long since we have seen him because he has been in the hospital to be honest. 08/07/2021 upon evaluation today patient appears to be doing well at this time. Fortunately I do not see any signs of active infection locally or systemically at this time which is great news. No fevers, chills, nausea, vomiting, or diarrhea. Unfortunately after I saw him on the 24th he actually ended up in the hospital in the 27th due to being septic. This was not due to the wounds but after looking at records actually due to a UTI. Fortunately he is doing much better and very happy in that regard. I do not see any signs of infection locally nor systemically at this time. 08/21/2021 upon evaluation today patient appears to be doing well with regard to his wound. Fortunately I think that the sacral region is doing decently well at this point which is great news. With regard to the foot this also does have some slough noted but I feel like we are making progress here. He does have some irritation around the right upper thigh/gluteal region. I feel like it is more towards the thigh. Nonetheless this does appear to be pressure related he spends a lot of time sitting up his caregiver states he really will not get in the bed and stay there like he should. [name], [name] ([id number]) [id number] Page 6 of 8 09/04/2021 upon evaluation patient appears to be doing decently  well today in regard to the wound on his heel as well as the sacral area. I am actually very pleased with both and how things are appearing currently. There does not appear to be any evidence of active infection I think that his caregiver is doing an awesome job with regard to the wound  care here. She is present today as well and I did discuss this with her. 09/25/2021 upon evaluation today patient appears to be doing well with regard to his wound on the sacral region. His right heel is also doing well. Unfortunately he has a new area on his left heel which does appear to be pressure injury. I do not see any evidence of active infection locally or systemically which is great news no fevers, chills, nausea, vomiting, or diarrhea. Readmission: 11-22-2021 upon evaluation today patient presents for reevaluation here in the clinic concerning issues with his wounds. Since have last seen him he was in the hospital and then subsequently ended up in a skilled nursing facility. Subsequently period of time in the facility he actually had a breakdown in the right thigh location which has been an issue for him. Fortunately I do not see any evidence of active infection locally or systemically which is great news and I am very pleased in that regard. Nonetheless he does have still the wounds on both heels as well as the wound in the sacral area and the new area in the right upper thigh/gluteal region. 12-13-2021 upon evaluation today patient appears to be doing well currently in regard to his wounds. The left heel is going to require some sharp debridement. Fortunately the right heel seems to be doing quite well. Overall I think his gluteal region as well as sacral region also are doing well. 01-10-2022 upon evaluation today patient appears to be doing better in regard to some areas unfortunately his sacral area is not doing as well. It seems of gotten worse his caregiver states when he was in the hospital recently they  really did not offloading. Fortunately I do not see any evidence of active infection locally or systemically at this time. 02-01-2022 upon evaluation today patient appears to be doing well currently in regard to his wounds. He is going require some sharp debridement of clearway some of the necrotic debris. Fortunately I do not see any evidence of active infection locally or systemically which is great news. No fevers, chills, nausea, vomiting, or diarrhea. 02-22-2022 upon evaluation today patient appears to be doing poorly in regard to his right heel the left heel is completely closed. Unfortunately I think he is got some deep tissue injury to this area I am going to do a culture at this spot. With that being said he is having 2 other significant issues in regard to the left thigh this is showing signs of cellulitis all the way down into the thigh and was seeing definite temperature difference in the left thigh versus the right thigh when feeling. It also is of note that the patient is also having some issues here with trouble in regard to having fevers his wife has been giving him Tylenol and ibuprofen to help keep the fevers under control but he has been as high as 102 this is pretty much been going on since Monday or Tuesday of this week. Nonetheless I am concerned I am not exactly sure where the cellulitis on the left thigh is coming from not sure if this is emanating from his sacral region that is a possibility or if there is something completely different going on either way with the fevers I am more worried that he needs to go get this checked out as soon as possible. 03-08-2022 upon evaluation today patient appears to be doing well with regard to his heel as well as sacral region. Both are showing  signs of significant improvement compared to the last time I saw him. Overall I think we are in a much better spot 04-04-2022 upon evaluation today patient appears to be doing about the same in regard to  his wounds. I do not see any signs of improvement nor worsening at this point. Fortunately there is no evidence of active infection at this time. He is not staying off of this is much as he needs to in fact is not even sleeping in the hospital bed with the alternating air mattress. 04-16-2022 upon evaluation today patient appears to be doing well currently in regard to his wound. He has been tolerating the dressing changes without complication. Fortunately I do not see any evidence of infection. The heel actually looks quite well the sacral area actually is still quite deep. From my questioning and discussion with him today I do not believe that he is really staying off of this as much as he should. He seemed a little uncomfortable during that portion of the conversation today. I think that is contributing quite a bit to his lack of progress with regard to healing. 11/7; patient with decubitus ulcers on his right heel as well as his lower sacrum. We have been using Hydrofera Blue. He has home health predominantly for Foley catheter management. There have been some issues getting the Hydrofera Blue through home health but apparently the patient's wife has some supplies and is managing to get these changed.They come every 3 weeks because of transportation difficulties 05-28-2022 upon evaluation today patient appears to be doing decently well currently in regard to his wounds. He still has a depth to the wound in the sacral region but again this does seem to be a little bit less than previous. He has been using Hydrofera Blue both on this in the heel ulcer. Both are going to require some cleaning today sharp debridement on the heel I think is cleaning with saline gauze and the sterile Q-tip is probably sufficient for the sacral area which does not appear to have too much necrotic tissue. 06-18-2022 upon evaluation today patient's wounds are showing signs of doing decently well. Fortunately I do not see  any signs of active infection locally nor systemically at this time which is great news. No fevers, chills, nausea, vomiting, or diarrhea. 07-15-2022 upon evaluation today patient appears to be doing well currently in regard to his wound. He has been tolerating the dressing changes without complication. Fortunately I think that the heel is doing well except for where it is causing pressure to the heel posteriorly and this apparently is due to some kind of exercise machine that his children got him. Nonetheless I am not sure that that is going to be helpful especially if it is causing discomfort and pain. And especially if it is making the wound worse which is an even bigger deal. With regard to the patient's sacral region this is draining quite significantly. I do think a wound VAC would be ideal here and I would recommend that we go ahead and try to see about getting this ordered for him as soon as possible. The patient's wife and the patient are in agreement with this plan. 08-05-2022 upon evaluation today patient appears to be doing well currently in regard to both his heel and his sacral region. Unfortunately he is not eligible for a wound VAC as he is previously already undergone all the treatment that is allowable under Medicare guidelines for his sacral wound. This is quite  unfortunate as I felt like that could have potentially help this speed this up but nonetheless it is where we stand. For that reason I think the [facility] is probably still the best way to continue at this point and I discussed that with the patient and his wife today. 08-26-2022 upon evaluation today patient appears to be doing well currently in regard to his wound. He on the heel of his looking much better I did have to perform some debridement here. In regard to the sacral area no debridement was necessary it looks to clean this has been although there was a lot of Hydrofera Blue and there much more than is probably  necessary. 09-15-2021 upon evaluation today patient appears to be doing well currently in regard to his heel ulcer which is looking okay although the gluteal region specifically the sacral wound is showing signs of some issues there. Fortunately I do not see any evidence of infection significantly to the heel although I am not so sure about the sacral region. I think he probably would benefit from initiation of antibiotics. 10-01-2022 upon evaluation today patient unfortunately had a hospital stay where he ended up having his catheter which had been apparently blowing up into the urethra. With that being said he had traumatic bleeding and subsequently ended up in the hospital I did review that discharge summary as well. The good news is he is doing somewhat better at this point but he did end up laying on his backside a lot causing the wound on the sacral area to actually be worse today compared to where we have been previous. This is not good and not the direction we want to go. Again the depth is what was actually more significant. [name], [name] ([id number]) [id number] Page 7 of 8 Objective Constitutional Obese and well-hydrated in no acute distress. Vitals Time Taken: 8:59 AM, Height: 66 in, Weight: 279 lbs, BMI: 45, Temperature: 98.4 F, Pulse: 72 bpm, Respiratory Rate: 18 breaths/min, Blood Pressure: 162/84 mmHg. Respiratory normal breathing without difficulty. Psychiatric this patient is able to make decisions and demonstrates good insight into disease process. Alert and Oriented x 3. pleasant and cooperative. General Notes: Upon inspection patient's wound bed actually showed signs again of being clean both the heel as well as the sacral area but again in the sacral region this is not doing nearly as good he needs to stay off of this cirrhosis can continue to get worse I had a long discussion with him on offloading today as well. Integumentary (Hair,  Skin) Wound #5 status is Open. Original cause of wound was Pressure Injury. The date acquired was: 07/01/2021. The wound has been in treatment 44 weeks. The wound is located on the Right Calcaneus. The wound measures 2.5cm length x 2cm width x 0.2cm depth; 3.927cm^2 area and 0.785cm^3 volume. There is Fat Layer (Subcutaneous Tissue) exposed. There is no tunneling or undermining noted. There is a medium amount of serosanguineous drainage noted. There is medium (34-66%) red, pink granulation within the wound bed. There is a medium (34-66%) amount of necrotic tissue within the wound bed including Adherent Slough. Wound #7 status is Open. Original cause of wound was Pressure Injury. The date acquired was: 07/01/2021. The wound has been in treatment 44 weeks. The wound is located on the Sacrum. The wound measures 2.5cm length x 2.5cm width x 4.5cm depth; 4.909cm^2 area and 22.089cm^3 volume. There is Fat Layer (Subcutaneous Tissue) exposed. There is no tunneling or undermining noted. There is a  medium amount of serosanguineous drainage noted. There is large (67- 100%) red granulation within the wound bed. There is no necrotic tissue within the wound bed. 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 Home Health: [facility] for wound care. May utilize formulary equivalent dressing for wound treatment orders unless otherwise specified. Home Health Nurse may visit  PRN to address patientoos wound care needs. Beverly Gust [phone] **Please direct any NON-WOUND related issues/requests for orders to patient's Primary Care Physician. **If current dressing causes regression in wound condition, may D/C ordered dressing product/s and apply Normal Saline Moist Dressing daily until next Wound Healing Center or Other MD appointment. **Notify Wound Healing Center of regression in wound condition at [phone]. Bathing/ Shower/ Hygiene: No tub bath. Anesthetic (Use 'Patient Medications' Section for Anesthetic Order Entry): Lidocaine applied to wound bed Edema Control - Lymphedema / Segmental Compressive Device / Other: Elevate, Exercise Daily and Avoid Standing for Long Periods of Time. Elevate legs to the level of the heart and pump ankles as often as possible Elevate leg(s) parallel to the floor when sitting. DO YOUR BEST to sleep in the bed at night. DO NOT sleep in your recliner. Long hours of sitting in a recliner leads to swelling of the legs and/or potential wounds on your backside. Off-Loading: Gel wheelchair cushion Low air-loss mattress (Group 2) - Sleep in bed every night. Turn and reposition every 2 hours - keep pressure off of the sacrum and heels wounds Other: - PRAFO boot in bed keep pressure off of sacrum/gluteus and heels- Additional Orders / Instructions: Follow Nutritious Diet and Increase Protein Intake WOUND #5: - Calcaneus Wound Laterality: Right Prim Dressing: Hydrofera Blue Ready Transfer Foam, 2.5x2.5 (in/in) 3 x Per Week/30 Days [name], [name] ([id number]) [id number] Page 8 of 8 Discharge Instructions: cut to size of wound Secondary Dressing: (BORDER) Zetuvit Plus SILICONE BORDER Dressing 4x4 (in/in) 3 x Per Week/30 Days WOUND #7: - Sacrum Wound Laterality: Topical: calmoseptine 1 x Per Day/30 Days Discharge Instructions: apply to excorated areas Prim Dressing: Hydrofera Blue Ready  Transfer Foam, 4x5 (in/in) 1 x Per Day/30 Days ary Discharge Instructions: pack lightly into wound Secondary Dressing: (BORDER) Zetuvit Plus SILICONE BORDER Dressing 4x4 (in/in) 1 x Per Day/30 Days 1. Would recommend currently that we have the patient continue to monitor for any signs of infection or worsening. Office if anything changes he can let us know but at the time being I think continue with [facility] both locations is the best way to go. 2. I am also going to recommend that we have the patient continue with the Zetuvit bordered foam dressings to cover. 3. He should continue with appropriate and aggressive offloading I told him if he does not do it at home is can end up having to go to skilled nursing facility which she does not want to do. We will see patient back for reevaluation in 1 week here in the clinic. If anything worsens or changes patient will contact our office for additional recommendations. Electronic Signature(s) Signed: 10/01/2022 9:30:30 AM By:  [name], [name] Wierman PA-C Entered By: Lenda Kelp on 10/01/2022 09:30:29 -------------------------------------------------------------------------------- SuperBill Details Patient Name: Date of Service: [name] [facility] 10/01/2022 Medical Record Number: [id number] Patient Account Number: [id number] Date of Birth/Sex: Treating RN: 09/30/1955 (66 y.o. [name]) [name] Primary Care Provider: Dione Booze Other Clinician: Referring Provider: Treating Provider/Extender: [name] in Treatment: 44 Diagnosis Coding ICD-10 Codes Code Description [phone] Pressure ulcer of right heel, stage 3 L89.623 Pressure ulcer of left heel, stage 3 L89.154 Pressure ulcer of sacral region, stage 4 L24.A0 Irritant contact dermatitis due to friction or contact with body fluids, unspecified L98.412 Non-pressure chronic ulcer of buttock with fat layer exposed M62.81 Muscle weakness (generalized) F31.9 Bipolar  disorder, unspecified E11.622 Type 2 diabetes mellitus with other skin ulcer I10 Essential (primary) hypertension Z79.01 Long term (current) use of anticoagulants 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 Facility Procedures : CPT4 Code: [id number] Description: 99213 - WOUND CARE VISIT-LEV 3 EST PT Modifier: Quantity: 1 Physician Procedures : CPT4 Code Description Modifier [id number] 99213 - WC PHYS LEVEL 3 - EST PT ICD-10 Diagnosis Description L89.613 Pressure ulcer of right heel, stage 3 L89.623 Pressure ulcer of left heel, stage 3 L89.154 Pressure ulcer of sacral region, stage 4 L24.A0  Irritant contact dermatitis due to friction or contact with body fluids, unspecified Quantity: 1 Electronic Signature(s) Signed: 10/01/2022 9:30:43 AM By: Lenda Kelp PA-C Entered By: Lenda Kelp on 10/01/2022 09:30:43

## 2022-10-01 NOTE — Progress Notes (Addendum)
BAZIL, COLLINSON ([id number]) [id number] Page 1 of 8 Visit Report for 10/01/2022 Arrival Information Details Patient Name: Date of Service: [name] [facility] 10/01/2022 8:45 A M Medical Record Number: [id number] Patient Account Number: [id number] Date of Birth/Sex: Treating RN: 05-14-1956 (67 y.o. [name]) [name] Primary Care [name]: [name] Other Clinician: Referring Massey Ruhland: Treating Latarsha Zani/Extender: [name]: 37 Visit Information History Since Last Visit Added or deleted any medications: No Patient Arrived: Wheel Chair Any new allergies or adverse reactions: No Arrival Time: 08:58 Had a fall or experienced change in No Accompanied By: wife activities of daily living that may affect Transfer Assistance: None risk of falls: Patient Identification Verified: Yes Signs or symptoms of abuse/neglect since last visito No Secondary Verification Process Completed: Yes Hospitalized since last visit: No Patient Requires Transmission-Based Precautions: No Implantable device outside of the clinic excluding No Patient Has Alerts: Yes cellular tissue based products placed in the center Patient Alerts: Patient on Blood Thinner since last visit: Has Dressing in Place as Prescribed: Yes Pain Present Now: No Electronic Signature(s) Signed: 10/01/2022 4:11:34 PM By: [name] RN Entered By: [name] on 10/01/2022 08:59:51 -------------------------------------------------------------------------------- Clinic Level of Care Assessment Details Patient Name: Date of Service: [name] [facility] 10/01/2022 8:45 A M Medical Record Number: [id number] Patient Account Number: [id number] Date of Birth/Sex: Treating RN: [birth year] (67 y.o. [name]) [name] Primary Care Heidi Maclin: [name] Other Clinician: Referring Burnis Kaser: Treating [name]/Extender: [name]:  44 Clinic Level of Care Assessment Items TOOL 4 Quantity Score X- 1 0 Use when only an EandM is performed on FOLLOW-UP visit ASSESSMENTS - Nursing Assessment / Reassessment X- 1 10 Reassessment of Co-morbidities (includes updates in patient status) X- 1 5 Reassessment of Adherence to Treatment Plan ASSESSMENTS - Wound and Skin A ssessment / Reassessment []  - 0 Simple Wound Assessment / Reassessment - one wound X- 2 5 Complex Wound Assessment / Reassessment - multiple wounds []  - 0 Dermatologic / Skin Assessment (not related to wound area) ASSESSMENTS - Focused Assessment []  - 0 Circumferential Edema Measurements - multi extremities []  - 0 Nutritional Assessment / Counseling / Intervention []  - 0 Lower Extremity Assessment (monofilament, tuning fork, pulses) []  - 0 Peripheral Arterial Disease Assessment (using hand held doppler) ASSESSMENTS - Ostomy and/or Continence Assessment and Care []  - 0 Incontinence Assessment and Management []  - 0 Ostomy Care Assessment and Management (repouching, etc.) PROCESS - Coordination of Care X - Simple Patient / Family Education for ongoing care 1 [address], [place] ([id number]) [phone].pdf Page 2 of 8 []  - 0 Complex (extensive) Patient / Family Education for ongoing care X- 1 10 Staff obtains Programmer, systems, Records, T Results / Process Orders est []  - 0 Staff telephones HHA, Nursing Homes / Clarify orders / etc []  - 0 Routine Transfer to another Facility (non-emergent condition) []  - 0 Routine Hospital Admission (non-emergent condition) []  - 0 New Admissions / Biomedical engineer / Ordering NPWT Apligraf, etc. , []  - 0 Emergency Hospital Admission (emergent condition) X- 1 10 Simple Discharge Coordination []  - 0 Complex (extensive) Discharge Coordination PROCESS - Special Needs []  - 0 Pediatric / Minor Patient Management []  - 0 Isolation Patient Management []  - 0 Hearing / Language / Visual  special needs []  - 0 Assessment of Community assistance (transportation, D/C planning, etc.) []  - 0 Additional assistance / Altered mentation []  - 0 Support Surface(s) Assessment (bed, cushion, seat, etc.) INTERVENTIONS - Wound Cleansing / Measurement []  -  0 Simple Wound Cleansing - one wound X- 2 5 Complex Wound Cleansing - multiple wounds X- 1 5 Wound Imaging (photographs - any number of wounds) []  - 0 Wound Tracing (instead of photographs) []  - 0 Simple Wound Measurement - one wound X- 2 5 Complex Wound Measurement - multiple wounds INTERVENTIONS - Wound Dressings X - Small Wound Dressing one or multiple wounds 2 10 []  - 0 Medium Wound Dressing one or multiple wounds []  - 0 Large Wound Dressing one or multiple wounds []  - 0 Application of Medications - topical []  - 0 Application of Medications - injection INTERVENTIONS - Miscellaneous []  - 0 External ear exam []  - 0 Specimen Collection (cultures, biopsies, blood, body fluids, etc.) []  - 0 Specimen(s) / Culture(s) sent or taken to Lab for analysis []  - 0 Patient Transfer (multiple staff / Civil Service fast streamer / Similar devices) []  - 0 Simple Staple / Suture removal (25 or less) []  - 0 Complex Staple / Suture removal (26 or more) []  - 0 Hypo / Hyperglycemic Management (close monitor of Blood Glucose) []  - 0 Ankle / Brachial Index (ABI) - do not check if billed separately X- 1 5 Vital Signs Has the patient been seen at the hospital within the last three years: Yes Total Score: 110 Level Of Care: New/Established - Level 3 Electronic Signature(s) Signed: 10/01/2022 4:11:34 PM By: [name] RN Entered By: [name] on 10/01/2022 09:26:07 [name], [name] ([id number]) [id number] Page 3 of 8 -------------------------------------------------------------------------------- Encounter Discharge Information Details Patient Name: Date of Service: [name] [facility] 10/01/2022 8:45 A  M Medical Record Number: [id number] Patient Account Number: [id number] Date of Birth/Sex: Treating RN: 02-08-1956 (67 y.o. [name]) [name] Primary Care Tyrena Gohr: [name] Other Clinician: Referring Caisley Baxendale: Treating Jennea Rager/Extender: [name]: 25 Encounter Discharge Information Items Discharge Condition: Stable Ambulatory Status: Wheelchair Discharge Destination: Home Transportation: Private Auto Accompanied By: wife Schedule Follow-up Appointment: Yes Clinical Summary of Care: Electronic Signature(s) Signed: 10/01/2022 4:11:34 PM By: [name] RN Entered By: [name] on 10/01/2022 09:26:47 -------------------------------------------------------------------------------- Lower Extremity Assessment Details Patient Name: Date of Service: [name] [facility] 10/01/2022 8:45 A M Medical Record Number: [id number] Patient Account Number: [id number] Date of Birth/Sex: Treating RN: Mar 08, 1956 (66 y.o. [name]) [name] Primary Care Karem Farha: [name] Other Clinician: Referring Utah Delauder: Treating Bayden Gil/Extender: [name]: 3 Electronic Signature(s) Signed: 10/01/2022 4:11:34 PM By: [name] RN Entered By: [name] on 10/01/2022 09:10:00 -------------------------------------------------------------------------------- Multi Wound Chart Details Patient Name: Date of Service: [name] [facility] 10/01/2022 8:45 A M Medical Record Number: [id number] Patient Account Number: [id number] Date of Birth/Sex: Treating RN: 1955/12/26 (66 y.o. [name]) [name] Primary Care Allure Greaser: [name] Other Clinician: Referring Maralyn Witherell: Treating Adreana Coull/Extender: [name]: 44 Vital Signs Height(in): 66 Pulse(bpm): 72 Weight(lbs): 279 Blood Pressure(mmHg): 162/84 Body Mass Index(BMI): 45 Temperature(F): 98.4 Respiratory Rate(breaths/min):  18 [5:Photos:] [N/A:N/A] Ravenscroft, [name] ([id number]) [5:Right Calcaneus Wound Location: Pressure Injury Wounding Event: Pressure Ulcer Primary Etiology: Anemia, Sleep Apnea, Coronary Artery Anemia, Sleep Apnea, Coronary Artery N/A Comorbid History: Disease, Deep Vein Thrombosis, Hypertension, Type II  Diabetes, History of pressure wounds, Neuropathy 07/01/2021 Date Acquired: 56 Weeks of Treatment: Open Wound Status: No Wound Recurrence: 2.5x2x0.2 Measurements L x W x D (cm) 3.927 A (cm) : rea 0.785 Volume (cm) : 5.10% % Reduction in A rea: -89.60% %  Reduction in Volume: Category/Stage III Classification: Medium Exudate A mount: Serosanguineous Exudate Type:  red, brown Exudate Color: Medium (34-66%) Granulation A mount: Red, Pink Granulation Quality: Medium (34-66%) Necrotic A mount: Fat Layer  (Subcutaneous Tissue): Yes Fat Layer (Subcutaneous Tissue): Yes N/A Exposed Structures: Fascia: No Tendon: No Muscle: No Joint: No Bone: No None Epithelialization:] [7:Sacrum Pressure Injury Pressure Ulcer Disease, Deep Vein Thrombosis, Hypertension,  Type II Diabetes, History of pressure wounds, Neuropathy 07/01/2021 44 Open No 2.5x2.5x4.5 4.909 22.089 -56.20% -134.40% Category/Stage IV Medium Serosanguineous red, brown Large (67-100%) Red None Present (0%) Fascia: No Tendon: No Muscle: No Joint: No  Bone: No None] [N/A:[id number] Page 4 of 8 N/A N/A N/A N/A N/A N/A N/A N/A N/A N/A N/A N/A N/A N/A N/A N/A N/A N/A N/A N/A] Treatment Notes Electronic Signature(s) Signed: 10/01/2022 4:11:34 PM By: [name] RN Entered By: [name] on 10/01/2022 09:10:04 -------------------------------------------------------------------------------- Multi-Disciplinary Care Plan Details Patient Name: Date of Service: [name] [facility] 10/01/2022 8:45 A M Medical Record Number: [id number] Patient Account Number: [id number] Date of Birth/Sex: Treating RN: 05-12-1956 (66 y.o. [name]) [name] Primary Care Jefferson Fullam: [name] Other Clinician: Referring Lachlyn Vanderstelt: Treating William Schake/Extender: [name]: 44 Active Inactive Wound/Skin Impairment Nursing Diagnoses: Knowledge deficit related to ulceration/compromised skin integrity Goals: Patient/caregiver will verbalize understanding of skin care regimen Date Initiated: 11/22/2021 Target Resolution Date: 10/24/2022 Goal Status: Active Ulcer/skin breakdown will have a volume reduction of 30% by week 4 Date Initiated: 11/22/2021 Date Inactivated: 01/10/2022 Target Resolution Date: 12/23/2021 Goal Status: Unmet Unmet Reason: comorbities Ulcer/skin breakdown will have a volume reduction of 50% by week 8 Date Initiated: 11/22/2021 Date Inactivated: 05/07/2022 Target Resolution Date: 01/22/2022 Goal Status: Unmet Unmet Reason: comorbidities Ulcer/skin breakdown will have a volume reduction of 80% by week 12 Date Initiated: 11/22/2021 Date Inactivated: 05/07/2022 Target Resolution Date: 02/22/2022 Goal Status: Unmet Unmet Reason: comorbidities Ulcer/skin breakdown will heal within 14 weeks Date Initiated: 11/22/2021 Date Inactivated: 05/07/2022 Target Resolution Date: 03/25/2022 Goal Status: Unmet Unmet Reason: comorbidities Interventions: Assess patient/caregiver ability to obtain necessary supplies Murata, [name] ([id number]) [id number] Page 5 of 8 Assess patient/caregiver ability to perform ulcer/skin care regimen upon admission and as needed Assess ulceration(s) every visit Notes: Electronic Signature(s) Signed: 10/01/2022 4:11:34 PM By: [name] RN Entered By: [name] on 10/01/2022 09:10:18 -------------------------------------------------------------------------------- Pain Assessment Details Patient Name: Date of Service: [name] [facility] 10/01/2022 8:45 A M Medical Record Number: [id number] Patient Account Number:  [id number] Date of Birth/Sex: Treating RN: 1956/02/12 (66 y.o. [name]) [name] Primary Care Mana Haberl: [name] Other Clinician: Referring Rochel Privett: Treating Jayke Caul/Extender: [name]: 44 Active Problems Location of Pain Severity and Description of Pain Patient Has Paino No Site Locations Pain Management and Medication Current Pain Management: Electronic Signature(s) Signed: 10/01/2022 4:11:34 PM By: [name] RN Entered By: [name] on 10/01/2022 09:00:24 -------------------------------------------------------------------------------- Patient/Caregiver Education Details Patient Name: Date of Service: [name] [facility] 4/2/2024andnbsp8:45 [place] Record Number: [id number] Patient Account Number: [id number] Date of Birth/Gender: Treating RN: 02/16/1956 (66 y.o. [name]) [name] Primary Care Physician: [name] Other Clinician: Referring Physician: Treating Physician/Extender: [name]: 45 Education Assessment Education Provided To: Patient and Caregiver Education Topics Provided Pressure: Handouts: Pressure Injury: Prevention and Aslam Daun, [name] ([id number]) [id number] Page 6 of 8 Methods: Explain/Verbal Responses: State content correctly Electronic Signature(s) Signed: 10/01/2022 4:11:34 PM By: [name] RN Entered By: [name] on 10/01/2022 09:10:42 -------------------------------------------------------------------------------- Wound Assessment Details Patient Name: Date of Service: [name] [facility] 10/01/2022  8:45 A M Medical Record Number: [id number] Patient Account Number: [id number] Date of Birth/Sex: Treating RN: April 22, 1956 (66 y.o. [name]) [name] Primary Care Ethie Curless: [name] Other Clinician: Referring Viraaj Vorndran: Treating Maile Linford/Extender: [name] in  Treatment: 50 Wound Status Wound Number: 5 Primary Pressure Ulcer Etiology: Wound Location: Right Calcaneus Wound Open Wounding Event: Pressure Injury Status: Date Acquired: 07/01/2021 Comorbid Anemia, Sleep Apnea, Coronary Artery Disease, Deep Vein Weeks Of Treatment: 44 History: Thrombosis, Hypertension, Type II Diabetes, History of pressure Clustered Wound: No wounds, Neuropathy Photos Wound Measurements Length: (cm) 2.5 Width: (cm) 2 Depth: (cm) 0.2 Area: (cm) 3.927 Volume: (cm) 0.785 % Reduction in Area: 5.1% % Reduction in Volume: -89.6% Epithelialization: None Tunneling: No Undermining: No Wound Description Classification: Category/Stage III Exudate Amount: Medium Exudate Type: Serosanguineous Exudate Color: red, brown Foul Odor After Cleansing: No Slough/Fibrino Yes Wound Bed Granulation Amount: Medium (34-66%) Exposed Structure Granulation Quality: Red, Pink Fascia Exposed: No Necrotic Amount: Medium (34-66%) Fat Layer (Subcutaneous Tissue) Exposed: Yes Necrotic Quality: Adherent Slough Tendon Exposed: No Muscle Exposed: No Joint Exposed: No Bone Exposed: No Treatment Notes Wound #5 (Calcaneus) Wound Laterality: Right Cleanser Peri-Wound Care Topical Bisceglia, [name] ([id number]) [id number] Page 7 of 8 Primary Dressing Hydrofera Blue Ready Transfer Foam, 2.5x2.5 (in/in) Discharge Instruction: cut to size of wound Secondary Dressing (BORDER) Zetuvit Plus SILICONE BORDER Dressing 4x4 (in/in) Secured With Compression Wrap Compression Stockings Add-Ons Electronic Signature(s) Signed: 10/01/2022 4:11:34 PM By: [name] RN Entered By: [name] on 10/01/2022 09:09:35 -------------------------------------------------------------------------------- Wound Assessment Details Patient Name: Date of Service: [name] [facility] 10/01/2022 8:45 A M Medical Record Number: [id number] Patient Account Number:  [id number] Date of Birth/Sex: Treating RN: 01/05/1956 (66 y.o. [name]) [name] Primary Care Aseel Uhde: [name] Other Clinician: Referring Azha Constantin: Treating Bayard More/Extender: [name]: 73 Wound Status Wound Number: 7 Primary Pressure Ulcer Etiology: Wound Location: Sacrum Wound Open Wounding Event: Pressure Injury Status: Date Acquired: 07/01/2021 Comorbid Anemia, Sleep Apnea, Coronary Artery Disease, Deep Vein Weeks Of Treatment: 44 History: Thrombosis, Hypertension, Type II Diabetes, History of pressure Clustered Wound: No wounds, Neuropathy Photos Wound Measurements Length: (cm) 2.5 Width: (cm) 2.5 Depth: (cm) 4.5 Area: (cm) 4.909 Volume: (cm) 22.089 % Reduction in Area: -56.2% % Reduction in Volume: -134.4% Epithelialization: None Tunneling: No Undermining: No Wound Description Classification: Category/Stage IV Exudate Amount: Medium Exudate Type: Serosanguineous Exudate Color: red, brown Foul Odor After Cleansing: No Slough/Fibrino Yes Wound Bed Granulation Amount: Large (67-100%) Exposed Structure Granulation Quality: Red Fascia Exposed: No Necrotic Amount: None Present (0%) Fat Layer (Subcutaneous Tissue) Exposed: Yes Tendon Exposed: No Muscle Exposed: No Joint Exposed: No Bone Exposed: No Saraceni, Noland ([id number]) [id number] Page 8 of 8 Treatment Notes Wound #7 (Sacrum) Cleanser Peri-Wound Care Topical calmoseptine Discharge Instruction: apply to excorated areas Primary Dressing Hydrofera Blue Ready Transfer Foam, 4x5 (in/in) Discharge Instruction: pack lightly into wound Secondary Dressing (BORDER) Zetuvit Plus SILICONE BORDER Dressing 4x4 (in/in) Secured With Compression Wrap Compression Stockings Add-Ons Electronic Signature(s) Signed: 10/01/2022 4:11:34 PM By: [name] RN Entered By: [name] on 10/01/2022  09:09:52 -------------------------------------------------------------------------------- [place] Details Patient Name: Date of Service: [name] [facility] 10/01/2022 8:45 A M Medical Record Number: [id number] Patient Account Number: [id number] Date of Birth/Sex: Treating RN: 12-31-1955 (66 y.o. Oval Linsey Primary Care Seanne Chirico: [name] Other Clinician: Referring Kemo Spruce: Treating Wally Shevchenko/Extender: [name]: 44 Vital Signs Time Taken: 08:59 Temperature (F): 98.4 Height (in): 66 Pulse (bpm): 72 Weight (  lbs): 279 Respiratory Rate (breaths/min): 18 Body Mass Index (BMI): 45 Blood Pressure (mmHg): 162/84 Reference Range: 80 - 120 mg / dl Electronic Signature(s) Signed: 10/01/2022 4:11:34 PM By: [name] RN Entered By: [name] on 10/01/2022 09:00:14

## 2022-10-16 ENCOUNTER — Encounter
Admission: RE | Admit: 2022-10-16 | Discharge: 2022-10-16 | Disposition: A | Discharge status: Home / Self Care | Payer: Medicare Other | Source: Ambulatory Visit | Attending: Urology | Admitting: Urology

## 2022-10-16 VITALS — Ht 69.0 in | Wt 255.7 lb

## 2022-10-16 DIAGNOSIS — Z01818 Encounter for other preprocedural examination: Secondary | ICD-10-CM

## 2022-10-16 DIAGNOSIS — Z01812 Encounter for preprocedural laboratory examination: Secondary | ICD-10-CM

## 2022-10-16 DIAGNOSIS — E875 Hyperkalemia: Secondary | ICD-10-CM

## 2022-10-16 DIAGNOSIS — I10 Essential (primary) hypertension: Secondary | ICD-10-CM

## 2022-10-16 DIAGNOSIS — D631 Anemia in chronic kidney disease: Secondary | ICD-10-CM

## 2022-10-16 HISTORY — DX: Other specified disorders of bladder: N32.89

## 2022-10-16 HISTORY — DX: Obesity, unspecified: E66.9

## 2022-10-16 HISTORY — DX: Unspecified hydronephrosis: N13.30

## 2022-10-16 HISTORY — DX: Heart failure, unspecified: I50.9

## 2022-10-16 HISTORY — DX: Carrier of other specified bacterial diseases: Z22.39

## 2022-10-16 HISTORY — DX: Presence of other specified devices: Z97.8

## 2022-10-16 HISTORY — DX: Colostomy status: Z93.3

## 2022-10-16 HISTORY — DX: Anemia, unspecified: D64.9

## 2022-10-16 HISTORY — DX: Sepsis, unspecified organism: A41.9

## 2022-10-16 HISTORY — DX: Urinary tract infection, site not specified: N39.0

## 2022-10-16 HISTORY — DX: Pressure ulcer of sacral region, unspecified stage: L89.159

## 2022-10-16 HISTORY — DX: Chronic kidney disease, stage 3 unspecified: N18.30

## 2022-10-16 HISTORY — DX: Dementia in other diseases classified elsewhere, unspecified severity, without behavioral disturbance, psychotic disturbance, mood disturbance, and anxiety: F02.80

## 2022-10-16 HISTORY — DX: Hematuria, unspecified: R31.9

## 2022-10-16 HISTORY — DX: Neuromuscular dysfunction of bladder, unspecified: N31.9

## 2022-10-16 HISTORY — DX: Toxic effect of other metals, accidental (unintentional), initial encounter: T56.891A

## 2022-10-16 HISTORY — DX: Cellulitis, unspecified: L03.90

## 2022-10-16 HISTORY — DX: Non-pressure chronic ulcer of unspecified heel and midfoot with unspecified severity: L97.409

## 2022-10-16 HISTORY — DX: Hyperkalemia: E87.5

## 2022-10-16 HISTORY — DX: Long term (current) use of anticoagulants: Z79.01

## 2022-10-16 HISTORY — DX: Acidosis, unspecified: E87.20

## 2022-10-16 HISTORY — DX: Hypo-osmolality and hyponatremia: E87.1

## 2022-10-16 NOTE — Pre-Procedure Instructions (Signed)
Called pts wife [name] to do anesthesia PAT interview. I asked her if she had been instructed on when to stop her husbands Eliquis. Pt states that she thinks it may be Wednesday of next week but states she will call over to Dr Keane Scrape office to confirm. [name] verified that she would. I tried to secure chat [facility] Surgery Scheduler and she is offline and out of office until tomorrow 4-18

## 2022-10-16 NOTE — Patient Instructions (Addendum)
Your procedure is scheduled on:10-25-22 Friday Report to the Registration Desk on the 1st floor of the Medical Mall.Then proceed to the 2nd floor Surgery Desk To find out your arrival time, please call [phone] between 1PM - 3PM on:10-24-22 Thursday If your arrival time is 6:00 am, do not arrive before that time as the Medical Mall entrance doors do not open until 6:00 am.  REMEMBER: Instructions that are not followed completely may result in serious medical risk, up to and including death; or upon the discretion of your surgeon and anesthesiologist your surgery may need to be rescheduled.  Do not eat food OR drink any liquids after midnight the night before surgery.  No gum chewing or hard candies.  One week prior to surgery: Stop Anti-inflammatories (NSAIDS) such as Advil, Aleve, Ibuprofen, Motrin, Naproxen, Naprosyn and Aspirin based products such as Excedrin, Goody's Powder, BC Powder.You may however, take Tylenol if needed for pain up until the day of surgery.  Stop ANY OVER THE COUNTER supplements/vitamins NOW (10-16-22) until after surgery (Fish Oil and Vitamin B12)  Call Dr Keane Scrape office today (10-16-22) to find out when Eliquis needs to be stopped . TAKE ONLY THESE MEDICATIONS THE MORNING OF SURGERY WITH A SIP OF WATER: -memantine (NAMENDA)  -Oxcarbazepine (TRILEPTAL)  -sodium bicarbonate  -QUEtiapine (SEROQUEL)   Continue your furosemide (LASIX) up until the day prior to surgery-Do NOT take the morning of surgery  No Alcohol for 24 hours before or after surgery.  No Smoking including e-cigarettes for 24 hours before surgery.  No chewable tobacco products for at least 6 hours before surgery.  No nicotine patches on the day of surgery.  Do not use any "recreational" drugs for at least a week (preferably 2 weeks) before your surgery.  Please be advised that the combination of cocaine and anesthesia may have negative outcomes, up to and including death. If you test  positive for cocaine, your surgery will be cancelled.  On the morning of surgery brush your teeth with toothpaste and water, you may rinse your mouth with mouthwash if you wish. Do not swallow any toothpaste or mouthwash.  Do not wear jewelry, make-up, hairpins, clips or nail polish.  Do not wear lotions, powders, or perfumes.   Do not shave body hair from the neck down 48 hours before surgery.  Contact lenses, hearing aids and dentures may not be worn into surgery.  Do not bring valuables to the hospital. [facility] is not responsible for any missing/lost belongings or valuables.   Notify your doctor if there is any change in your medical condition (cold, fever, infection).  Wear comfortable clothing (specific to your surgery type) to the hospital.  After surgery, you can help prevent lung complications by doing breathing exercises.  Take deep breaths and cough every 1-2 hours. Your doctor may order a device called an Incentive Spirometer to help you take deep breaths. When coughing or sneezing, hold a pillow firmly against your incision with both hands. This is called "splinting." Doing this helps protect your incision. It also decreases belly discomfort.  If you are being admitted to the hospital overnight, leave your suitcase in the car. After surgery it may be brought to your room.  In case of increased patient census, it may be necessary for you, the patient, to continue your postoperative care in the Same Day Surgery department.  If you are being discharged the day of surgery, you will not be allowed to drive home. You will need a responsible  individual to drive you home and stay with you for 24 hours after surgery.   If you are taking public transportation, you will need to have a responsible individual with you.  Please call the Pre-admissions Testing Dept. at [phone] if you have any questions about these instructions.  Surgery Visitation Policy:  Patients having  surgery or a procedure may have two visitors.  Children under the age of 64 must have an adult with them who is not the patient.

## 2022-10-17 ENCOUNTER — Encounter
Admission: RE | Admit: 2022-10-17 | Discharge: 2022-10-17 | Disposition: A | Discharge status: Home / Self Care | Payer: Medicare Other | Source: Ambulatory Visit | Attending: Urology | Admitting: Urology

## 2022-10-17 ENCOUNTER — Telehealth: Payer: Self-pay

## 2022-10-17 DIAGNOSIS — N183 Chronic kidney disease, stage 3 unspecified: Secondary | ICD-10-CM | POA: Diagnosis not present

## 2022-10-17 DIAGNOSIS — D631 Anemia in chronic kidney disease: Secondary | ICD-10-CM | POA: Insufficient documentation

## 2022-10-17 DIAGNOSIS — I10 Essential (primary) hypertension: Secondary | ICD-10-CM | POA: Insufficient documentation

## 2022-10-17 DIAGNOSIS — Z01818 Encounter for other preprocedural examination: Secondary | ICD-10-CM | POA: Insufficient documentation

## 2022-10-17 DIAGNOSIS — Z01812 Encounter for preprocedural laboratory examination: Secondary | ICD-10-CM

## 2022-10-17 DIAGNOSIS — E875 Hyperkalemia: Secondary | ICD-10-CM | POA: Diagnosis not present

## 2022-10-17 LAB — TYPE AND SCREEN
ABO/RH(D): A POS
Antibody Screen: NEGATIVE

## 2022-10-17 LAB — CBC
HCT: 33.2 % — ABNORMAL LOW (ref 39.0–52.0)
Hemoglobin: 9.8 g/dL — ABNORMAL LOW (ref 13.0–17.0)
MCH: 25.9 pg — ABNORMAL LOW (ref 26.0–34.0)
MCHC: 29.5 g/dL — ABNORMAL LOW (ref 30.0–36.0)
MCV: 87.8 fL (ref 80.0–100.0)
Platelets: 247 10*3/uL (ref 150–400)
RBC: 3.78 MIL/uL — ABNORMAL LOW (ref 4.22–5.81)
RDW: 18.2 % — ABNORMAL HIGH (ref 11.5–15.5)
WBC: 5.7 10*3/uL (ref 4.0–10.5)
nRBC: 0 % (ref 0.0–0.2)

## 2022-10-17 LAB — BASIC METABOLIC PANEL
Anion gap: 11 (ref 5–15)
BUN: 40 mg/dL — ABNORMAL HIGH (ref 8–23)
CO2: 23 mmol/L (ref 22–32)
Calcium: 8.6 mg/dL — ABNORMAL LOW (ref 8.9–10.3)
Chloride: 103 mmol/L (ref 98–111)
Creatinine, Ser: 2.11 mg/dL — ABNORMAL HIGH (ref 0.61–1.24)
GFR, Estimated: 34 mL/min — ABNORMAL LOW (ref >60)
Glucose, Bld: 182 mg/dL — ABNORMAL HIGH (ref 70–99)
Potassium: 4.3 mmol/L (ref 3.5–5.1)
Sodium: 137 mmol/L (ref 135–145)

## 2022-10-17 NOTE — Telephone Encounter (Signed)
Spoke with pt. Received clearance back from PCP for Eliquis hold. Patient to hold Eliquis for 48 hours per Dr. Richardo Hanks and Dr. Candace Cruise. Patient advised and verbalized understanding.

## 2022-10-22 ENCOUNTER — Encounter: Payer: Medicare Other | Admitting: Physician Assistant

## 2022-10-22 DIAGNOSIS — E11622 Type 2 diabetes mellitus with other skin ulcer: Secondary | ICD-10-CM | POA: Diagnosis not present

## 2022-10-22 NOTE — Progress Notes (Signed)
RANVIR, RENOVATO ([id number]) [id number] Page 1 of 9 Visit Report for 10/22/2022 Chief Complaint Document Details Patient Name: Date of Service: [name] [facility] 10/22/2022 8:45 A M Medical Record Number: [id number] Patient Account Number: [id number] Date of Birth/Sex: Treating RN: 04/10/1956 (66 y.o. [name]) [name] Primary Care Provider: Dione Booze Other Clinician: Referring Provider: Treating Provider/Extender: [name] in Treatment: 35 Information Obtained from: Patient Chief Complaint Sacral, right gluteal, and bilateral heel ulcers Electronic Signature(s) Signed: 10/22/2022 8:52:35 AM By: Allen Derry PA-C Entered By: Allen Derry on 10/22/2022 08:52:34 -------------------------------------------------------------------------------- Debridement Details Patient Name: Date of Service: [name] [facility] 10/22/2022 8:45 A M Medical Record Number: [id number] Patient Account Number: [id number] Date of Birth/Sex: Treating RN: 1956/03/31 (66 y.o. [name]) [name] Primary Care Provider: Dione Booze Other Clinician: Referring Provider: Treating Provider/Extender: [name] in Treatment: 47 Debridement Performed for Assessment: Wound #5 Right Calcaneus Performed By: Physician Allen Derry, PA-C Debridement Type: Debridement Level of Consciousness (Pre-procedure): Awake and Alert Pre-procedure Verification/Time Out Yes - 09:10 Taken: Start Time: 09:10 Percent of Wound Bed Debrided: 50% T Area Debrided (cm): otal 1.18 Tissue and other material debrided: Viable, Non-Viable, Slough, Subcutaneous, Slough Level: Skin/Subcutaneous Tissue Debridement Description: Excisional Instrument: Curette Bleeding: Minimum Hemostasis Achieved: Pressure End Time: 09:13 Procedural Pain: 0 Post Procedural Pain: 0 Response to Treatment: Procedure was tolerated well Level of Consciousness (Post-  Awake and Alert procedure): Post Debridement Measurements of Total Wound Length: (cm) 2 Stage: Category/Stage III Width: (cm) 1.5 Depth: (cm) 0.2 Volume: (cm) 0.471 Character of Wound/Ulcer Post Debridement: Improved Post Procedure Diagnosis Same as Pre-procedure Electronic Signature(s) Signed: 10/22/2022 11:50:37 AM By: Allen Derry PA-C Signed: 10/22/2022 4:05:57 PM By: [name] RN Entered By: [name] on 10/22/2022 09:12:21 [name], [name] ([id number]) [id number] Page 2 of 9 -------------------------------------------------------------------------------- HPI Details Patient Name: Date of Service: [name] [facility] 10/22/2022 8:45 A M Medical Record Number: [id number] Patient Account Number: [id number] Date of Birth/Sex: Treating RN: [birth year] (66 y.o. [name]) [name] Primary Care Provider: Dione Booze Other Clinician: Referring Provider: Treating Provider/Extender: [name] in Treatment: 52 History of Present Illness HPI Description: 05/29/2021 this is a patient who presents today for initial inspection here in the clinic concerning wounds that he has over the right heel and the sacral region. Unfortunately the sacral wound is starting to spread off to the right gluteal location due to how he sits always leaning towards the right side in his chair. His wife is present she is the primary caregiver though she is not home with him all the time she does have to work. She does do an excellent job however it appears to be in trying to keep things under good control for him. The patient is not able to change positions himself nor walk by himself so he is pretty much at the mercy of the position he is putting when she is gone and this tends to be his chair which she sits and most of the day. Obviously this I think is the main culprit for what is going on currently. It was actually in January 2020 when the  sacral wound started. It was in September 2022 when the wound started to spread more to the right gluteal location. Subsequently in August 2022 is when he had been in a skilled nursing facility and the heel started to give him trouble as well. That does not seem to be doing nearly as poorly as the sacral region.  He was hospitalized in October 2022 secondary to sepsis and this was in regard to the foot and was sent to skilled nursing again he is now back at home. He did have a wound VAC for the sacral wound over the summer 2022 but being in and out of facilities this ended up getting sent back. The patient does have [facility] home health that comes out 1 time per week to help with care. His most recent hemoglobin A1c was 6.9 in August 2022. 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 s. 07/24/2021 upon evaluation today patient appears to be doing decently well in regard to the pressure ulcer on the right heel as well as the sacral region. In general I think you are making some progress here which is great news. Overall the heel unfortunately had already closed previously when we saw him although it apparently reopened when he was working with physical therapy according to his wife. The area in the sacral region is doing well and looks clean there is still some depth here but I still think it would be difficult to wound VAC this region. His wife does an awesome job taking care of him. Is been so long since we have seen him because he has been in the hospital to be honest. 08/07/2021 upon evaluation today patient appears to be doing well at this time. Fortunately I do not see any signs of active infection locally or systemically at this time which is great news. No fevers, chills, nausea, vomiting, or  diarrhea. Unfortunately after I saw him on the 24th he actually ended up in the hospital in the 27th due to being septic. This was not due to the wounds but after looking at records actually due to a UTI. Fortunately he is doing much better and very happy in that regard. I do not see any signs of infection locally nor systemically at this time. 08/21/2021 upon evaluation today patient appears to be doing well with regard to his wound. Fortunately I think that the sacral region is doing decently well at this point which is great news. With regard to the foot this also does have some slough noted but I feel like we are making progress here. He does have some irritation around the right upper thigh/gluteal region. I feel like it is more towards the thigh. Nonetheless this does appear to be pressure related he spends a lot of time sitting up his caregiver states he really will not get in the bed and stay there like he should. 09/04/2021 upon evaluation patient appears to be doing decently well today in regard to the wound on his heel as well as the sacral area. I am actually very pleased with both and how things are appearing currently. There does not appear to be any evidence of active infection I think that his caregiver is doing an awesome job with regard to the wound care here. She is present today as well and I did discuss this with her. 09/25/2021 upon evaluation today patient appears to be doing well with regard to his wound on the sacral region. His right heel is also doing well. Unfortunately he has a new area on his left heel which does appear to be pressure injury. I do not see any evidence of active infection locally or systemically which is great news  no fevers, chills, nausea, vomiting, or diarrhea. Readmission: 11-22-2021 upon evaluation today patient presents for reevaluation here in the clinic concerning issues with his wounds. Since have last seen him he was in the hospital and then  subsequently ended up in a skilled nursing facility. Subsequently period of time in the facility he actually had a breakdown in the right thigh location which has been an issue for him. Fortunately I do not see any evidence of active infection locally or systemically which is great news and I am very pleased in that regard. Nonetheless he does have still the wounds on both heels as well as the wound in the sacral area and the new area in the right upper thigh/gluteal region. 12-13-2021 upon evaluation today patient appears to be doing well currently in regard to his wounds. The left heel is going to require some sharp debridement. Fortunately the right heel seems to be doing quite well. Overall I think his gluteal region as well as sacral region also are doing well. 01-10-2022 upon evaluation today patient appears to be doing better in regard to some areas unfortunately his sacral area is not doing as well. It seems of gotten worse his caregiver states when he was in the hospital recently they really did not offloading. Fortunately I do not see any evidence of active infection locally or systemically at this time. 02-01-2022 upon evaluation today patient appears to be doing well currently in regard to his wounds. He is going require some sharp debridement of clearway some of the necrotic debris. Fortunately I do not see any evidence of active infection locally or systemically which is great news. No fevers, chills, nausea, vomiting, or diarrhea. 02-22-2022 upon evaluation today patient appears to be doing poorly in regard to his right heel the left heel is completely closed. Unfortunately I think he is got some deep tissue injury to this area I am going to do a culture at this spot. With that being said he is having 2 other significant issues in regard to the left thigh this is showing signs of cellulitis all the way down into the thigh and was seeing definite temperature difference in the left thigh versus  the right thigh when feeling. It also is of note that the patient is also having some issues here with trouble in regard to having fevers his wife has been giving him Tylenol and ibuprofen to help keep the fevers under control but he has been as high as 102 this is pretty much been going on since Monday or Tuesday of this week. Nonetheless I am concerned I am not exactly sure where the cellulitis on the left thigh is coming from not sure if this is emanating from his sacral region that is a possibility or if there is something completely different going on either way with the fevers I am more worried that he needs to go get this checked out as soon as possible. 03-08-2022 upon evaluation today patient appears to be doing well with regard to his heel as well as sacral region. Both are showing signs of significant improvement compared to the last time I saw him. Overall I think we are in a much better spot 04-04-2022 upon evaluation today patient appears to be doing about the same in regard to his wounds. I do not see any signs of improvement nor worsening at this point. Fortunately there is no evidence of active infection at this time. He is not staying off of this is much as  he needs to in fact is not even sleeping in the hospital bed with the alternating air mattress. [name], SHAMOON ([id number]) [id number] Page 3 of 9 04-16-2022 upon evaluation today patient appears to be doing well currently in regard to his wound. He has been tolerating the dressing changes without complication. Fortunately I do not see any evidence of infection. The heel actually looks quite well the sacral area actually is still quite deep. From my questioning and discussion with him today I do not believe that he is really staying off of this as much as he should. He seemed a little uncomfortable during that portion of the conversation today. I think that is contributing quite a bit to his lack  of progress with regard to healing. 11/7; patient with decubitus ulcers on his right heel as well as his lower sacrum. We have been using Hydrofera Blue. He has home health predominantly for Foley catheter management. There have been some issues getting the Hydrofera Blue through home health but apparently the patient's wife has some supplies and is managing to get these changed.They come every 3 weeks because of transportation difficulties 05-28-2022 upon evaluation today patient appears to be doing decently well currently in regard to his wounds. He still has a depth to the wound in the sacral region but again this does seem to be a little bit less than previous. He has been using Hydrofera Blue both on this in the heel ulcer. Both are going to require some cleaning today sharp debridement on the heel I think is cleaning with saline gauze and the sterile Q-tip is probably sufficient for the sacral area which does not appear to have too much necrotic tissue. 06-18-2022 upon evaluation today patient's wounds are showing signs of doing decently well. Fortunately I do not see any signs of active infection locally nor systemically at this time which is great news. No fevers, chills, nausea, vomiting, or diarrhea. 07-15-2022 upon evaluation today patient appears to be doing well currently in regard to his wound. He has been tolerating the dressing changes without complication. Fortunately I think that the heel is doing well except for where it is causing pressure to the heel posteriorly and this apparently is due to some kind of exercise machine that his children got him. Nonetheless I am not sure that that is going to be helpful especially if it is causing discomfort and pain. And especially if it is making the wound worse which is an even bigger deal. With regard to the patient's sacral region this is draining quite significantly. I do think a wound VAC would be ideal here and I would recommend that we go  ahead and try to see about getting this ordered for him as soon as possible. The patient's wife and the patient are in agreement with this plan. 08-05-2022 upon evaluation today patient appears to be doing well currently in regard to both his heel and his sacral region. Unfortunately he is not eligible for a wound VAC as he is previously already undergone all the treatment that is allowable under Medicare guidelines for his sacral wound. This is quite unfortunate as I felt like that could have potentially help this speed this up but nonetheless it is where we stand. For that reason I think the [facility] is probably still the best way to continue at this point and I discussed that with the patient and his wife today. 08-26-2022 upon evaluation today patient appears to be doing well currently in regard to  his wound. He on the heel of his looking much better I did have to perform some debridement here. In regard to the sacral area no debridement was necessary it looks to clean this has been although there was a lot of Hydrofera Blue and there much more than is probably necessary. 09-15-2021 upon evaluation today patient appears to be doing well currently in regard to his heel ulcer which is looking okay although the gluteal region specifically the sacral wound is showing signs of some issues there. Fortunately I do not see any evidence of infection significantly to the heel although I am not so sure about the sacral region. I think he probably would benefit from initiation of antibiotics. 10-01-2022 upon evaluation today patient unfortunately had a hospital stay where he ended up having his catheter which had been apparently blowing up into the urethra. With that being said he had traumatic bleeding and subsequently ended up in the hospital I did review that discharge summary as well. The good news is he is doing somewhat better at this point but he did end up laying on his backside a lot causing the wound  on the sacral area to actually be worse today compared to where we have been previous. This is not good and not the direction we want to go. Again the depth is what was actually more significant. 10-22-2022 upon evaluation today patient appears to be doing decently well in regard to his heel ulcer. I do not see any signs of active infection locally nor systemically at this time which is great news. No fevers, chills, nausea, vomiting, or diarrhea. Electronic Signature(s) Signed: 10/22/2022 9:27:00 AM By: Allen Derry PA-C Entered By: Allen Derry on 10/22/2022 09:26:59 -------------------------------------------------------------------------------- Physical Exam Details Patient Name: Date of Service: [name] [facility] 10/22/2022 8:45 A M Medical Record Number: [id number] Patient Account Number: [id number] Date of Birth/Sex: Treating RN: 1956-03-28 (66 y.o. [name]) [name] Primary Care Provider: Dione Booze Other Clinician: Referring Provider: Treating Provider/Extender: [name] in Treatment: 80 Constitutional Well-nourished and well-hydrated in no acute distress. Respiratory normal breathing without difficulty. Psychiatric this patient is able to make decisions and demonstrates good insight into disease process. Alert and Oriented x 3. pleasant and cooperative. Notes Upon inspection patient's wound bed still shows depth here. I am kind of afraid that with the use of the packing strip were actually overdoing things from the standpoint of the packing and causing some additional trauma internally. I would like to try to switch to a Hydrofera Blue dressing to see if this could be any better for him he is in agreement this plan. Electronic Signature(s) Signed: 10/22/2022 9:27:20 AM By: Allen Derry PA-C Entered By: Allen Derry on 10/22/2022 09:27:19 [name], [name] ([id number]) [id number] Page 4 of  9 -------------------------------------------------------------------------------- Physician Orders Details Patient Name: Date of Service: [name] [facility] 10/22/2022 8:45 A M Medical Record Number: [id number] Patient Account Number: [id number] Date of Birth/Sex: Treating RN: 08/11/1955 (66 y.o. [name]) [name] Primary Care Provider: Dione Booze Other Clinician: Referring Provider: Treating Provider/Extender: [name] in Treatment: 71 Verbal / Phone Orders: No Diagnosis Coding ICD-10 Coding Code Description L89.613 Pressure ulcer of right heel, stage 3 L89.623 Pressure ulcer of left heel, stage 3 L89.154 Pressure ulcer of sacral region, stage 4 L24.A0 Irritant contact dermatitis due to friction or contact with body fluids, unspecified L98.412 Non-pressure chronic ulcer of buttock with fat layer exposed M62.81 Muscle weakness (generalized) F31.9 Bipolar disorder, unspecified E11.622  Type 2 diabetes mellitus with other skin ulcer I10 Essential (primary) hypertension Z79.01 Long term (current) use of anticoagulants 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 Home Health [facility] for wound care. May utilize formulary equivalent dressing for wound treatment orders unless otherwise specified. Home Health Nurse may visit PRN to address patients wound care needs. Beverly Gust [phone] **Please direct any NON-WOUND related issues/requests for orders to patient's Primary Care Physician. **If current dressing causes regression in wound condition, may D/C ordered dressing product/s and apply Normal Saline Moist Dressing daily until next Wound Healing Center or Other MD appointment. **Notify Wound Healing Center of regression in wound condition at [phone]. Bathing/ Shower/ Hygiene No tub bath. Anesthetic (Use 'Patient Medications' Section for Anesthetic Order Entry) Lidocaine  applied to wound bed Edema Control - Lymphedema / Segmental Compressive Device / Other Elevate, Exercise Daily and A void Standing for Long Periods of Time. Elevate legs to the level of the heart and pump ankles as often as possible Elevate leg(s) parallel to the floor when sitting. DO YOUR BEST to sleep in the bed at night. DO NOT sleep in your recliner. Long hours of sitting in a recliner leads to swelling of the legs and/or potential wounds on your backside. Off-Loading Gel wheelchair cushion Low air-loss mattress (Group 2) - Sleep in bed every night. Turn and reposition every 2 hours - keep pressure off of the sacrum and heels wounds Other: - PRAFO boot in bed keep pressure off of sacrum/gluteus and heels- Additional Orders / Instructions Follow Nutritious Diet and Increase Protein Intake Wound Treatment Wound #5 - Calcaneus Wound Laterality: Right Prim Dressing: Hydrofera Blue Ready Transfer Foam, 2.5x2.5 (in/in) ary 3 x Per Week/30 Days Discharge Instructions: cut to size of wound Secondary Dressing: (BORDER) Zetuvit Plus SILICONE BORDER Dressing 4x4 (in/in) 3 x Per Week/30 Days Wound #7 - Sacrum Topical: calmoseptine 1 x Per Day/30 Days Discharge Instructions: apply to excorated areas Prim Dressing: Hydrofera Blue Ready Transfer Foam, 4x5 (in/in) ary 1 x Per Day/30 Days Discharge Instructions: pack lightly into wound Mell, [name] ([id number]) [id number] Page 5 of 9 Secondary Dressing: (BORDER) Zetuvit Plus SILICONE BORDER Dressing 4x4 (in/in) 1 x Per Day/30 Days Electronic Signature(s) Signed: 10/22/2022 11:50:37 AM By: Allen Derry PA-C Signed: 10/22/2022 4:05:57 PM By: [name] RN Entered By: [name] on 10/22/2022 09:12:59 -------------------------------------------------------------------------------- Problem List Details Patient Name: Date of Service: [name] [facility] 10/22/2022 8:45 A M Medical Record Number:  [id number] Patient Account Number: [id number] Date of Birth/Sex: Treating RN: 29-Nov-1955 (66 y.o. [name] Primary Care Provider: Dione Booze Other Clinician: Referring Provider: Treating Provider/Extender: [name] in Treatment: 47 Active Problems ICD-10 Encounter Code Description Active Date MDM Diagnosis L89.613 Pressure ulcer of right heel, stage 3 11/22/2021 No Yes L89.623 Pressure ulcer of left heel, stage 3 11/22/2021 No Yes L89.154 Pressure ulcer of sacral region, stage 4 11/22/2021 No Yes L24.A0 Irritant contact dermatitis due to friction or contact with body fluids, 11/22/2021 No Yes unspecified L98.412 Non-pressure chronic ulcer of buttock with fat layer exposed 11/22/2021 No Yes M62.81 Muscle weakness (generalized) 11/22/2021 No Yes F31.9 Bipolar disorder, unspecified 11/22/2021 No Yes E11.622 Type 2 diabetes mellitus with other skin ulcer 11/22/2021 No Yes I10 Essential (primary) hypertension 11/22/2021 No Yes Z79.01 Long term (current) use of anticoagulants 11/22/2021 No Yes 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 11/22/2021 No Yes Inactive Problems Resolved Problems [name], [name] ([id number]) [id number] Page 6  of 9 Electronic Signature(s) Signed: 10/22/2022 8:52:27 AM By: Allen Derry PA-C Entered By: Allen Derry on 10/22/2022 08:52:27 -------------------------------------------------------------------------------- Progress Note Details Patient Name: Date of Service: [name] [facility] 10/22/2022 8:45 A M Medical Record Number: [id number] Patient Account Number: [id number] Date of Birth/Sex: Treating RN: 12/22/1955 (66 y.o. [name]) [name] Primary Care Provider: Dione Booze Other Clinician: Referring Provider: Treating Provider/Extender: [name] in Treatment: 47 Subjective Chief Complaint Information obtained from Patient Sacral, right gluteal, and  bilateral heel ulcers History of Present Illness (HPI) 05/29/2021 this is a patient who presents today for initial inspection here in the clinic concerning wounds that he has over the right heel and the sacral region. Unfortunately the sacral wound is starting to spread off to the right gluteal location due to how he sits always leaning towards the right side in his chair. His wife is present she is the primary caregiver though she is not home with him all the time she does have to work. She does do an excellent job however it appears to be in trying to keep things under good control for him. The patient is not able to change positions himself nor walk by himself so he is pretty much at the mercy of the position he is putting when she is gone and this tends to be his chair which she sits and most of the day. Obviously this I think is the main culprit for what is going on currently. It was actually in January 2020 when the sacral wound started. It was in September 2022 when the wound started to spread more to the right gluteal location. Subsequently in August 2022 is when he had been in a skilled nursing facility and the heel started to give him trouble as well. That does not seem to be doing nearly as poorly as the sacral region. He was hospitalized in October 2022 secondary to sepsis and this was in regard to the foot and was sent to skilled nursing again he is now back at home. He did have a wound VAC for the sacral wound over the summer 2022 but being in and out of facilities this ended up getting sent back. The patient does have [facility] home health that comes out 1 time per week to help with care. His most recent hemoglobin A1c was 6.9 in August 2022. 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 s. 07/24/2021 upon evaluation today patient appears to be doing decently well in regard to the pressure ulcer on the right heel as well as the sacral region. In general I think you are making some progress here which is great news. Overall the heel unfortunately had already closed previously when we saw him although it apparently reopened when he was working with physical therapy according to his wife. The area in the sacral region is doing well and looks clean there is still some depth here but I still think it would be difficult to wound VAC this region. His wife does an awesome job taking care of him. Is been so long since we have seen him because he has been in the hospital to be honest. 08/07/2021 upon evaluation today patient appears to be doing well at this time. Fortunately I do not see any signs of active infection locally or systemically at this time  which is great news. No fevers, chills, nausea, vomiting, or diarrhea. Unfortunately after I saw him on the 24th he actually ended up in the hospital in the 27th due to being septic. This was not due to the wounds but after looking at records actually due to a UTI. Fortunately he is doing much better and very happy in that regard. I do not see any signs of infection locally nor systemically at this time. 08/21/2021 upon evaluation today patient appears to be doing well with regard to his wound. Fortunately I think that the sacral region is doing decently well at this point which is great news. With regard to the foot this also does have some slough noted but I feel like we are making progress here. He does have some irritation around the right upper thigh/gluteal region. I feel like it is more towards the thigh. Nonetheless this does appear to be pressure related he spends a lot of time sitting up his caregiver states he really will not get in the bed and stay there like he should. 09/04/2021 upon evaluation patient appears to be doing  decently well today in regard to the wound on his heel as well as the sacral area. I am actually very pleased with both and how things are appearing currently. There does not appear to be any evidence of active infection I think that his caregiver is doing an awesome job with regard to the wound care here. She is present today as well and I did discuss this with her. 09/25/2021 upon evaluation today patient appears to be doing well with regard to his wound on the sacral region. His right heel is also doing well. Unfortunately he has a new area on his left heel which does appear to be pressure injury. I do not see any evidence of active infection locally or systemically which is great news no fevers, chills, nausea, vomiting, or diarrhea. Readmission: 11-22-2021 upon evaluation today patient presents for reevaluation here in the clinic concerning issues with his wounds. Since have last seen him he was in the hospital and then subsequently ended up in a skilled nursing facility. Subsequently period of time in the facility he actually had a breakdown in the right thigh location which has been an issue for him. Fortunately I do not see any evidence of active infection locally or systemically which is great news and I am very pleased in that regard. Nonetheless he does have still the wounds on both heels as well as the wound in the sacral area and the new area in the right upper thigh/gluteal region. 12-13-2021 upon evaluation today patient appears to be doing well currently in regard to his wounds. The left heel is going to require some sharp debridement. Fortunately the right heel seems to be doing quite well. Overall I think his gluteal region as well as sacral region also are doing well. 01-10-2022 upon evaluation today patient appears to be doing better in regard to some areas unfortunately his sacral area is not doing as well. It seems of gotten worse his caregiver states when he was in the hospital  recently they really did not offloading. Fortunately I do not see any evidence of active infection locally or systemically at this time. 02-01-2022 upon evaluation today patient appears to be doing well currently in regard to his wounds. He is going require some sharp debridement of clearway some of the necrotic debris. Fortunately I do not see any evidence of active infection locally or systemically  which is great news. No fevers, chills, nausea, vomiting, or diarrhea. 02-22-2022 upon evaluation today patient appears to be doing poorly in regard to his right heel the left heel is completely closed. Unfortunately I think he is got [name], [name] ([id number]) [id number] Page 7 of 9 some deep tissue injury to this area I am going to do a culture at this spot. With that being said he is having 2 other significant issues in regard to the left thigh this is showing signs of cellulitis all the way down into the thigh and was seeing definite temperature difference in the left thigh versus the right thigh when feeling. It also is of note that the patient is also having some issues here with trouble in regard to having fevers his wife has been giving him Tylenol and ibuprofen to help keep the fevers under control but he has been as high as 102 this is pretty much been going on since Monday or Tuesday of this week. Nonetheless I am concerned I am not exactly sure where the cellulitis on the left thigh is coming from not sure if this is emanating from his sacral region that is a possibility or if there is something completely different going on either way with the fevers I am more worried that he needs to go get this checked out as soon as possible. 03-08-2022 upon evaluation today patient appears to be doing well with regard to his heel as well as sacral region. Both are showing signs of significant improvement compared to the last time I saw him. Overall I think we are in a much  better spot 04-04-2022 upon evaluation today patient appears to be doing about the same in regard to his wounds. I do not see any signs of improvement nor worsening at this point. Fortunately there is no evidence of active infection at this time. He is not staying off of this is much as he needs to in fact is not even sleeping in the hospital bed with the alternating air mattress. 04-16-2022 upon evaluation today patient appears to be doing well currently in regard to his wound. He has been tolerating the dressing changes without complication. Fortunately I do not see any evidence of infection. The heel actually looks quite well the sacral area actually is still quite deep. From my questioning and discussion with him today I do not believe that he is really staying off of this as much as he should. He seemed a little uncomfortable during that portion of the conversation today. I think that is contributing quite a bit to his lack of progress with regard to healing. 11/7; patient with decubitus ulcers on his right heel as well as his lower sacrum. We have been using Hydrofera Blue. He has home health predominantly for Foley catheter management. There have been some issues getting the Hydrofera Blue through home health but apparently the patient's wife has some supplies and is managing to get these changed.They come every 3 weeks because of transportation difficulties 05-28-2022 upon evaluation today patient appears to be doing decently well currently in regard to his wounds. He still has a depth to the wound in the sacral region but again this does seem to be a little bit less than previous. He has been using Hydrofera Blue both on this in the heel ulcer. Both are going to require some cleaning today sharp debridement on the heel I think is cleaning with saline gauze and the sterile Q-tip is probably sufficient for the sacral  area which does not appear to have too much necrotic tissue. 06-18-2022 upon  evaluation today patient's wounds are showing signs of doing decently well. Fortunately I do not see any signs of active infection locally nor systemically at this time which is great news. No fevers, chills, nausea, vomiting, or diarrhea. 07-15-2022 upon evaluation today patient appears to be doing well currently in regard to his wound. He has been tolerating the dressing changes without complication. Fortunately I think that the heel is doing well except for where it is causing pressure to the heel posteriorly and this apparently is due to some kind of exercise machine that his children got him. Nonetheless I am not sure that that is going to be helpful especially if it is causing discomfort and pain. And especially if it is making the wound worse which is an even bigger deal. With regard to the patient's sacral region this is draining quite significantly. I do think a wound VAC would be ideal here and I would recommend that we go ahead and try to see about getting this ordered for him as soon as possible. The patient's wife and the patient are in agreement with this plan. 08-05-2022 upon evaluation today patient appears to be doing well currently in regard to both his heel and his sacral region. Unfortunately he is not eligible for a wound VAC as he is previously already undergone all the treatment that is allowable under Medicare guidelines for his sacral wound. This is quite unfortunate as I felt like that could have potentially help this speed this up but nonetheless it is where we stand. For that reason I think the [facility] is probably still the best way to continue at this point and I discussed that with the patient and his wife today. 08-26-2022 upon evaluation today patient appears to be doing well currently in regard to his wound. He on the heel of his looking much better I did have to perform some debridement here. In regard to the sacral area no debridement was necessary it looks to clean  this has been although there was a lot of Hydrofera Blue and there much more than is probably necessary. 09-15-2021 upon evaluation today patient appears to be doing well currently in regard to his heel ulcer which is looking okay although the gluteal region specifically the sacral wound is showing signs of some issues there. Fortunately I do not see any evidence of infection significantly to the heel although I am not so sure about the sacral region. I think he probably would benefit from initiation of antibiotics. 10-01-2022 upon evaluation today patient unfortunately had a hospital stay where he ended up having his catheter which had been apparently blowing up into the urethra. With that being said he had traumatic bleeding and subsequently ended up in the hospital I did review that discharge summary as well. The good news is he is doing somewhat better at this point but he did end up laying on his backside a lot causing the wound on the sacral area to actually be worse today compared to where we have been previous. This is not good and not the direction we want to go. Again the depth is what was actually more significant. 10-22-2022 upon evaluation today patient appears to be doing decently well in regard to his heel ulcer. I do not see any signs of active infection locally nor systemically at this time which is great news. No fevers, chills, nausea, vomiting, or diarrhea. Objective Constitutional  Well-nourished and well-hydrated in no acute distress. Vitals Time Taken: 8:55 AM, Height: 66 in, Weight: 279 lbs, BMI: 45, Temperature: 97.6 F, Pulse: 89 bpm, Respiratory Rate: 18 breaths/min, Blood Pressure: 149/88 mmHg. Respiratory normal breathing without difficulty. Psychiatric this patient is able to make decisions and demonstrates good insight into disease process. Alert and Oriented x 3. pleasant and cooperative. General Notes: Upon inspection patient's wound bed still shows depth here. I am  kind of afraid that with the use of the packing strip were actually overdoing things from the standpoint of the packing and causing some additional trauma internally. I would like to try to switch to a Hydrofera Blue dressing to see if this could be any better for him he is in agreement this plan. Integumentary (Hair, Skin) Wound #5 status is Open. Original cause of wound was Pressure Injury. The date acquired was: 07/01/2021. The wound has been in treatment 47 weeks. The wound is located on the Right Calcaneus. The wound measures 2cm length x 1.5cm width x 0.2cm depth; 2.356cm^2 area and 0.471cm^3 volume. There is Fat [name], [name] ([id number]) [id number] Page 8 of 9 Layer (Subcutaneous Tissue) exposed. There is no tunneling or undermining noted. There is a medium amount of serosanguineous drainage noted. There is medium (34-66%) red, pink granulation within the wound bed. There is a medium (34-66%) amount of necrotic tissue within the wound bed including Adherent Slough. Wound #7 status is Open. Original cause of wound was Pressure Injury. The date acquired was: 07/01/2021. The wound has been in treatment 47 weeks. The wound is located on the Sacrum. The wound measures 2cm length x 2cm width x 4cm depth; 3.142cm^2 area and 12.566cm^3 volume. There is Fat Layer (Subcutaneous Tissue) exposed. There is no tunneling or undermining noted. There is a medium amount of serosanguineous drainage noted. There is large (67- 100%) red granulation within the wound bed. There is no necrotic tissue within the wound bed. Assessment Active Problems ICD-10 Pressure ulcer of right heel, stage 3 Pressure ulcer of left heel, stage 3 Pressure ulcer of sacral region, stage 4 Irritant contact dermatitis due to friction or contact with body fluids, unspecified Non-pressure chronic ulcer of buttock with fat layer exposed Muscle weakness (generalized) Bipolar disorder, unspecified Type  2 diabetes mellitus with other skin ulcer Essential (primary) hypertension Long term (current) use of anticoagulants Personal history of traumatic brain injury Procedures Wound #5 Pre-procedure diagnosis of Wound #5 is a Pressure Ulcer located on the Right Calcaneus . There was a Excisional Skin/Subcutaneous Tissue Debridement with a total area of 1.18 sq cm performed by Allen Derry, PA-C. With the following instrument(s): Curette to remove Viable and Non-Viable tissue/material. Material removed includes Subcutaneous Tissue and Slough and. No specimens were taken. A time out was conducted at 09:10, prior to the start of the procedure. A Minimum amount of bleeding was controlled with Pressure. The procedure was tolerated well with a pain level of 0 throughout and a pain level of 0 following the procedure. Post Debridement Measurements: 2cm length x 1.5cm width x 0.2cm depth; 0.471cm^3 volume. Post debridement Stage noted as Category/Stage III. Character of Wound/Ulcer Post Debridement is improved. Post procedure Diagnosis Wound #5: Same as Pre-Procedure Plan Follow-up Appointments: Return Appointment in 3 weeks. Home Health: [facility] for wound care. May utilize formulary equivalent dressing for wound treatment orders unless otherwise specified. Home Health Nurse may visit PRN to address patientoos wound care needs. Beverly Gust [phone] **Please direct any NON-WOUND related issues/requests for orders to  patient's Primary Care Physician. **If current dressing causes regression in wound condition, may D/C ordered dressing product/s and apply Normal Saline Moist Dressing daily until next Wound Healing Center or Other MD appointment. **Notify Wound Healing Center of regression in wound condition at [phone]. Bathing/ Shower/ Hygiene: No tub bath. Anesthetic (Use 'Patient Medications' Section for Anesthetic Order Entry): Lidocaine applied to wound bed Edema Control -  Lymphedema / Segmental Compressive Device / Other: Elevate, Exercise Daily and Avoid Standing for Long Periods of Time. Elevate legs to the level of the heart and pump ankles as often as possible Elevate leg(s) parallel to the floor when sitting. DO YOUR BEST to sleep in the bed at night. DO NOT sleep in your recliner. Long hours of sitting in a recliner leads to swelling of the legs and/or potential wounds on your backside. Off-Loading: Gel wheelchair cushion Low air-loss mattress (Group 2) - Sleep in bed every night. Turn and reposition every 2 hours - keep pressure off of the sacrum and heels wounds Other: - PRAFO boot in bed keep pressure off of sacrum/gluteus and heels- Additional Orders / Instructions: Follow Nutritious Diet and Increase Protein Intake WOUND #5: - Calcaneus Wound Laterality: Right Prim Dressing: Hydrofera Blue Ready Transfer Foam, 2.5x2.5 (in/in) 3 x Per Week/30 Days ary Discharge Instructions: cut to size of wound Secondary Dressing: (BORDER) Zetuvit Plus SILICONE BORDER Dressing 4x4 (in/in) 3 x Per Week/30 Days WOUND #7: - Sacrum Wound Laterality: Topical: calmoseptine 1 x Per Day/30 Days Discharge Instructions: apply to excorated areas Prim Dressing: Hydrofera Blue Ready Transfer Foam, 4x5 (in/in) 1 x Per Day/30 Days ary Discharge Instructions: pack lightly into wound Secondary Dressing: (BORDER) Zetuvit Plus SILICONE BORDER Dressing 4x4 (in/in) 1 x Per Day/30 Days [name], [name] ([id number]) [id number] Page 9 of 9 1. Based on what I am seeing I do believe that the sacral region is actually doing better it does not seem to be quite as deep as what it was previous this is great news. 2. Also do believe that the patient is making pretty good progress in regard to the heel this is very slow to heal but nonetheless does seem to be improving little by little. We will see patient back for reevaluation in 3 weeks here in the clinic.  If anything worsens or changes patient will contact our office for additional recommendations. Electronic Signature(s) Signed: 10/22/2022 10:35:32 AM By: Allen Derry PA-C Entered By: Allen Derry on 10/22/2022 10:35:32 -------------------------------------------------------------------------------- SuperBill Details Patient Name: Date of Service: [name] [facility] 10/22/2022 Medical Record Number: [id number] Patient Account Number: [id number] Date of Birth/Sex: Treating RN: 1955-12-11 (66 y.o. [name]) [name] Primary Care Provider: Dione Booze Other Clinician: Referring Provider: Treating Provider/Extender: [name] in Treatment: 47 Diagnosis Coding ICD-10 Codes Code Description [phone] Pressure ulcer of right heel, stage 3 L89.623 Pressure ulcer of left heel, stage 3 L89.154 Pressure ulcer of sacral region, stage 4 L24.A0 Irritant contact dermatitis due to friction or contact with body fluids, unspecified L98.412 Non-pressure chronic ulcer of buttock with fat layer exposed M62.81 Muscle weakness (generalized) F31.9 Bipolar disorder, unspecified E11.622 Type 2 diabetes mellitus with other skin ulcer I10 Essential (primary) hypertension Z79.01 Long term (current) use of anticoagulants 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 Facility Procedures : CPT4 Code: [id number] Description: 11042 - DEB SUBQ TISSUE 20 SQ CM/< ICD-10 Diagnosis Description L89.613 Pressure ulcer of right heel, stage 3 Modifier: Quantity: 1 Physician Procedures : CPT4 Code Description Modifier [id number] 11042 - WC PHYS  SUBQ TISS 20 SQ CM ICD-10 Diagnosis Description L89.613 Pressure ulcer of right heel, stage 3 Quantity: 1 Electronic Signature(s) Signed: 10/22/2022 10:36:50 AM By: Allen Derry PA-C Entered By: Allen Derry on 10/22/2022 10:36:49

## 2022-10-22 NOTE — Progress Notes (Signed)
[name] ([id number]) [id number] Page 1 of 9 Visit Report for 10/22/2022 Arrival Information Details Patient Name: Date of Service: [name] [facility] 10/22/2022 8:45 A M Medical Record Number: [id number] Patient Account Number: [id number] Date of Birth/Sex: Treating RN: 11/07/1955 (66 y.o. [name]) [name] Primary Care [name]: [name] Other Clinician: Referring [name]: Treating [name]/Extender: [name] in Treatment: 56 Visit Information History Since Last Visit Added or deleted any medications: No Patient Arrived: Wheel Chair Any new allergies or adverse reactions: No Arrival Time: 08:54 Had a fall or experienced change in No Accompanied By: wife activities of daily living that may affect Transfer Assistance: None risk of falls: Patient Identification Verified: Yes Signs or symptoms of abuse/neglect since last visito No Secondary Verification Process Completed: Yes Hospitalized since last visit: No Patient Requires Transmission-Based Precautions: No Implantable device outside of the clinic excluding No Patient Has Alerts: Yes cellular tissue based products placed in the center Patient Alerts: Patient on Blood Thinner since last visit: Has Dressing in Place as Prescribed: Yes Pain Present Now: No Electronic Signature(s) Unsigned Entered ByYevonne [name] on 10/22/2022 09:01:04 -------------------------------------------------------------------------------- Clinic Level of Care Assessment Details Patient Name: Date of Service: [name] [facility] 10/22/2022 8:45 A M Medical Record Number: [id number] Patient Account Number: [id number] Date of Birth/Sex: Treating RN: July 18, 1955 (66 y.o. [name]) [name] Primary Care [name]: [name] Other Clinician: Referring [name]: Treating [name]/Extender: [name] in Treatment: 47 Clinic Level of Care Assessment  Items TOOL 4 Quantity Score []  - 0 Use when only an EandM is performed on FOLLOW-UP visit ASSESSMENTS - Nursing Assessment / Reassessment []  - 0 Reassessment of Co-morbidities (includes updates in patient status) []  - 0 Reassessment of Adherence to Treatment Plan ASSESSMENTS - Wound and Skin A ssessment / Reassessment []  - 0 Simple Wound Assessment / Reassessment - one wound []  - 0 Complex Wound Assessment / Reassessment - multiple wounds []  - 0 Dermatologic / Skin Assessment (not related to wound area) ASSESSMENTS - Focused Assessment []  - 0 Circumferential Edema Measurements - multi extremities []  - 0 Nutritional Assessment / Counseling / Intervention []  - 0 Lower Extremity Assessment (monofilament, tuning fork, pulses) []  - 0 Peripheral Arterial Disease Assessment (using hand held doppler) ASSESSMENTS - Ostomy and/or Continence Assessment and Care []  - 0 Incontinence Assessment and Management []  - 0 Ostomy Care Assessment and Management (repouching, etc.) [name] ([id number]) [id number] Page 2 of 9 PROCESS - Coordination of Care []  - Simple Patient / Family Education for ongoing care 0 []  - 0 Complex (extensive) Patient / Family Education for ongoing care []  - 0 Staff obtains Chiropractor, Records, T Results / Process Orders est []  - 0 Staff telephones HHA, Nursing Homes / Clarify orders / etc []  - 0 Routine Transfer to another Facility (non-emergent condition) []  - 0 Routine Hospital Admission (non-emergent condition) []  - 0 New Admissions / Manufacturing engineer / Ordering NPWT Apligraf, etc. , []  - 0 Emergency Hospital Admission (emergent condition) []  - 0 Simple Discharge Coordination []  - 0 Complex (extensive) Discharge Coordination PROCESS - Special Needs []  - 0 Pediatric / Minor Patient Management []  - 0 Isolation Patient Management []  - 0 Hearing / Language / Visual special needs []  - 0 Assessment of  Community assistance (transportation, D/C planning, etc.) []  - 0 Additional assistance / Altered mentation []  - 0 Support Surface(s) Assessment (bed, cushion, seat, etc.) INTERVENTIONS - Wound Cleansing / Measurement []  - 0 Simple Wound Cleansing - one  wound  - 0 Complex Wound Cleansing - multiple wounds  - 0 Wound Imaging (photographs - any number of wounds)  - 0 Wound Tracing (instead of photographs)  - 0 Simple Wound Measurement - one wound  - 0 Complex Wound Measurement - multiple wounds INTERVENTIONS - Wound Dressings  - 0 Small Wound Dressing one or multiple wounds  - 0 Medium Wound Dressing one or multiple wounds  - 0 Large Wound Dressing one or multiple wounds  - 0 Application of Medications - topical  - 0 Application of Medications - injection INTERVENTIONS - Miscellaneous  - 0 External ear exam  - 0 Specimen Collection (cultures, biopsies, blood, body fluids, etc.)  - 0 Specimen(s) / Culture(s) sent or taken to Lab for analysis  - 0 Patient Transfer (multiple staff / Nurse, adult / Similar devices)  - 0 Simple Staple / Suture removal (25 or less)  - 0 Complex Staple / Suture removal (26 or more)  - 0 Hypo / Hyperglycemic Management (close monitor of Blood Glucose)  - 0 Ankle / Brachial Index (ABI) - do not check if billed separately  - 0 Vital Signs Has the patient been seen at the hospital within the last three years: Yes Total Score: 0 Level Of Care: ____ Electronic Signature(s) [name], [name] ([id number]) [id number] Page 3 of 9 Unsigned Entered By: [name] on 10/22/2022 09:13:12 -------------------------------------------------------------------------------- Encounter Discharge Information Details Patient Name: Date of Service: [name] [facility] 10/22/2022 8:45 A M Medical Record Number: [id number] Patient Account Number: [id number] Date of Birth/Sex:  Treating RN: 1956/01/29 (66 y.o. [name]) [name] Primary Care Lendon George: [name] Other Clinician: Referring Felicidad Sugarman: Treating Dempsey Ahonen/Extender: [name] in Treatment: 40 Encounter Discharge Information Items Post Procedure Vitals Discharge Condition: Stable Temperature (F): 97.6 Ambulatory Status: Wheelchair Pulse (bpm): 89 Discharge Destination: Home Respiratory Rate (breaths/min): 18 Transportation: Private Auto Blood Pressure (mmHg): 149/88 Accompanied By: self Schedule Follow-up Appointment: Yes Clinical Summary of Care: Electronic Signature(s) Unsigned Entered ByYevonne [name] on 10/22/2022 09:14:25 -------------------------------------------------------------------------------- Lower Extremity Assessment Details Patient Name: Date of Service: [name] [facility] 10/22/2022 8:45 A M Medical Record Number: [id number] Patient Account Number: [id number] Date of Birth/Sex: Treating RN: 1956/03/01 (66 y.o. [name]) [name] Primary Care Illona Bulman: [name] Other Clinician: Referring Aryaan Persichetti: Treating Kathan Kirker/Extender: [name] in Treatment: 47 Electronic Signature(s) Unsigned Entered By: [name] on 10/22/2022 09:03:11 -------------------------------------------------------------------------------- Multi Wound Chart Details Patient Name: Date of Service: [name] Sickles [facility] 10/22/2022 8:45 A M Medical Record Number: [id number] Patient Account Number: [id number] Date of Birth/Sex: Treating RN: 19-Apr-1956 (66 y.o. [name]) [name] Primary Care Arrion Broaddus: [name] Other Clinician: Referring Gumecindo Hopkin: Treating Grecia Lynk/Extender: [name] in Treatment: 47 Vital Signs Height(in): 66 Pulse(bpm): 89 Weight(lbs): 279 Blood Pressure(mmHg): 149/88 Body Mass Index(BMI): 45 Temperature(F): 97.6 Respiratory Rate(breaths/min): 18 [5:Photos:] [N/A:N/A  [id number] Page 4 of 9] Right Calcaneus Sacrum N/A Wound Location: Pressure Injury Pressure Injury N/A Wounding Event: Pressure Ulcer Pressure Ulcer N/A Primary Etiology: Anemia, Sleep Apnea, Coronary Artery Anemia, Sleep Apnea, Coronary Artery N/A Comorbid History: Disease, Deep Vein Thrombosis, Disease, Deep Vein Thrombosis, Hypertension, Type II Diabetes, Hypertension, Type II Diabetes, History of pressure wounds, History of pressure wounds, Neuropathy Neuropathy 07/01/2021 07/01/2021 N/A Date Acquired: 98 47 N/A Weeks of Treatment: Open Open N/A Wound Status: No No N/A Wound Recurrence: 2x1.5x0.2 2x2x4 N/A Measurements L x W x D (cm) 2.356 3.142 N/A A (cm) : rea 0.471 12.566 N/A Volume (cm) :  43.10% 0.00% N/A % Reduction in A rea: -13.80% -33.30% N/A % Reduction in Volume: Category/Stage III Category/Stage IV N/A Classification: Medium Medium N/A Exudate A mount: Serosanguineous Serosanguineous N/A Exudate Type: red, brown red, brown N/A Exudate Color: Medium (34-66%) Large (67-100%) N/A Granulation A mount: Red, Pink Red N/A Granulation Quality: Medium (34-66%) None Present (0%) N/A Necrotic A mount: Fat Layer (Subcutaneous Tissue): Yes Fat Layer (Subcutaneous Tissue): Yes N/A Exposed Structures: Fascia: No Fascia: No Tendon: No Tendon: No Muscle: No Muscle: No Joint: No Joint: No Bone: No Bone: No None None N/A Epithelialization: Treatment Notes Electronic Signature(s) Unsigned Entered ByYevonne [name] on 10/22/2022 09:03:22 -------------------------------------------------------------------------------- Multi-Disciplinary Care Plan Details Patient Name: Date of Service: [name] Sickles [facility] 10/22/2022 8:45 A M Medical Record Number: [id number] Patient Account Number: [id number] Date of Birth/Sex: Treating RN: 1955-12-02 (66 y.o. [name]) [name] Primary Care Nevaeh Korte: [name] Other Clinician: Referring  Teryl Mcconaghy: Treating Earlyne Feeser/Extender: [name] in Treatment: 47 Active Inactive Wound/Skin Impairment Nursing Diagnoses: Knowledge deficit related to ulceration/compromised skin integrity Goals: Patient/caregiver will verbalize understanding of skin care regimen Date Initiated: 11/22/2021 Target Resolution Date: 11/23/2022 Goal Status: Active Ulcer/skin breakdown will have a volume reduction of 30% by week 4 Date Initiated: 11/22/2021 Date Inactivated: 01/10/2022 Target Resolution Date: 12/23/2021 Goal Status: Unmet Unmet Reason: comorbities Ulcer/skin breakdown will have a volume reduction of 50% by week 8 Date Initiated: 11/22/2021 Date Inactivated: 05/07/2022 Target Resolution Date: 01/22/2022 Goal Status: Unmet Unmet Reason: comorbidities UZZIEL, RUSSEY ([id number]) [id number] Page 5 of 9 Ulcer/skin breakdown will have a volume reduction of 80% by week 12 Date Initiated: 11/22/2021 Date Inactivated: 05/07/2022 Target Resolution Date: 02/22/2022 Goal Status: Unmet Unmet Reason: comorbidities Ulcer/skin breakdown will heal within 14 weeks Date Initiated: 11/22/2021 Date Inactivated: 05/07/2022 Target Resolution Date: 03/25/2022 Goal Status: Unmet Unmet Reason: comorbidities Interventions: Assess patient/caregiver ability to obtain necessary supplies Assess patient/caregiver ability to perform ulcer/skin care regimen upon admission and as needed Assess ulceration(s) every visit Notes: Electronic Signature(s) Unsigned Entered By: [name] on 10/22/2022 09:03:46 -------------------------------------------------------------------------------- Pain Assessment Details Patient Name: Date of Service: [name] [facility] 10/22/2022 8:45 A M Medical Record Number: [id number] Patient Account Number: [id number] Date of Birth/Sex: Treating RN: 06/16/1956 (66 y.o. [name]) [name] Primary Care Kelcy Laible: [name]  Other Clinician: Referring Nana Hoselton: Treating Manjot Hinks/Extender: [name] in Treatment: 47 Active Problems Location of Pain Severity and Description of Pain Patient Has Paino No Site Locations Pain Management and Medication Current Pain Management: Electronic Signature(s) Unsigned Entered ByYevonne [name] on 10/22/2022 09:01:32 -------------------------------------------------------------------------------- Patient/Caregiver Education Details Patient Name: Date of Service: [name] [facility] 4/23/2024andnbsp8:45 A M Medical Record Number: [id number] Patient Account Number: [id number] Date of Birth/Gender: Treating RN: 03-19-1956 (66 y.o. [name] Primary Care Physician: [name] Other Clinician: [name] ([id number]) [id number] Page 6 of 9 Referring Physician: Treating Physician/Extender: [name] in Treatment: 76 Education Assessment Education Provided To: Patient Education Topics Provided Wound/Skin Impairment: Handouts: Caring for Your Ulcer Methods: Explain/Verbal Responses: State content correctly Electronic Signature(s) Unsigned Entered By: [name] on 10/22/2022 09:03:58 -------------------------------------------------------------------------------- Wound Assessment Details Patient Name: Date of Service: [name] [facility] 10/22/2022 8:45 A M Medical Record Number: [id number] Patient Account Number: [id number] Date of Birth/Sex: Treating RN: 09-Jun-1956 (66 y.o. [name] Primary Care Deidrick Rainey: [name] Other Clinician: Referring Yvonna Brun: Treating Diannie Willner/Extender: [name] in Treatment: 47 Wound Status Wound Number: 5 Primary Pressure Ulcer Etiology: Wound Location:  Right Calcaneus Wound Open Wounding Event: Pressure Injury Status: Date Acquired: 07/01/2021 Comorbid Anemia, Sleep Apnea,  Coronary Artery Disease, Deep Vein Weeks Of Treatment: 47 History: Thrombosis, Hypertension, Type II Diabetes, History of pressure Clustered Wound: No wounds, Neuropathy Photos Wound Measurements Length: (cm) 2 Width: (cm) 1.5 Depth: (cm) 0.2 Area: (cm) 2.356 Volume: (cm) 0.471 % Reduction in Area: 43.1% % Reduction in Volume: -13.8% Epithelialization: None Tunneling: No Undermining: No Wound Description Classification: Category/Stage III Exudate Amount: Medium Exudate Type: Serosanguineous Exudate Color: red, brown Foul Odor After Cleansing: No Slough/Fibrino Yes Wound Bed Granulation Amount: Medium (34-66%) Exposed Structure Granulation Quality: Red, Pink Fascia Exposed: No Weihe, Yoel ([id number]) [id number] Page 7 of 9 Necrotic Amount: Medium (34-66%) Fat Layer (Subcutaneous Tissue) Exposed: Yes Necrotic Quality: Adherent Slough Tendon Exposed: No Muscle Exposed: No Joint Exposed: No Bone Exposed: No Treatment Notes Wound #5 (Calcaneus) Wound Laterality: Right Cleanser Peri-Wound Care Topical Primary Dressing Hydrofera Blue Ready Transfer Foam, 2.5x2.5 (in/in) Discharge Instruction: cut to size of wound Secondary Dressing (BORDER) Zetuvit Plus SILICONE BORDER Dressing 4x4 (in/in) Secured With Compression Wrap Compression Stockings Add-Ons Electronic Signature(s) Unsigned Entered ByYevonne [name] on 10/22/2022 09:02:30 -------------------------------------------------------------------------------- Wound Assessment Details Patient Name: Date of Service: [name] [facility] 10/22/2022 8:45 A M Medical Record Number: [id number] Patient Account Number: [id number] Date of Birth/Sex: Treating RN: 02-Sep-1955 (66 y.o. [name]) [name] Primary Care Lynee Rosenbach: [name] Other Clinician: Referring Layann Bluett: Treating Salimah Martinovich/Extender: [name] in Treatment: 47 Wound Status Wound Number:  7 Primary Pressure Ulcer Etiology: Wound Location: Sacrum Wound Open Wounding Event: Pressure Injury Status: Date Acquired: 07/01/2021 Comorbid Anemia, Sleep Apnea, Coronary Artery Disease, Deep Vein Weeks Of Treatment: 47 History: Thrombosis, Hypertension, Type II Diabetes, History of pressure Clustered Wound: No wounds, Neuropathy Photos Wound Measurements Length: (cm) 2 Width: (cm) 2 Depth: (cm) 4 Area: (cm) 3.142 Converse, Chipper ([id number]) Volume: (cm) 12.566 % Reduction in Area: 0% % Reduction in Volume: -33.3% Epithelialization: None Tunneling: No [id number] Page 8 of 9 Undermining: No Wound Description Classification: Category/Stage IV Exudate Amount: Medium Exudate Type: Serosanguineous Exudate Color: red, brown Foul Odor After Cleansing: No Slough/Fibrino Yes Wound Bed Granulation Amount: Large (67-100%) Exposed Structure Granulation Quality: Red Fascia Exposed: No Necrotic Amount: None Present (0%) Fat Layer (Subcutaneous Tissue) Exposed: Yes Tendon Exposed: No Muscle Exposed: No Joint Exposed: No Bone Exposed: No Treatment Notes Wound #7 (Sacrum) Cleanser Peri-Wound Care Topical calmoseptine Discharge Instruction: apply to excorated areas Primary Dressing Hydrofera Blue Ready Transfer Foam, 4x5 (in/in) Discharge Instruction: pack lightly into wound Secondary Dressing (BORDER) Zetuvit Plus SILICONE BORDER Dressing 4x4 (in/in) Secured With Compression Wrap Compression Stockings Add-Ons Electronic Signature(s) Unsigned Entered ByYevonne [name] on 10/22/2022 09:02:54 -------------------------------------------------------------------------------- Vitals Details Patient Name: Date of Service: [name] [facility] 10/22/2022 8:45 A M Medical Record Number: [id number] Patient Account Number: [id number] Date of Birth/Sex: Treating RN: 1955-11-01 (66 y.o. [name]) [name] Primary Care Oniyah Rohe: [name]  Other Clinician: Referring Antaniya Venuti: Treating Dylin Breeden/Extender: [name] in Treatment: 47 Vital Signs Time Taken: 08:55 Temperature (F): 97.6 Height (in): 66 Pulse (bpm): 89 Weight (lbs): 279 Respiratory Rate (breaths/min): 18 Body Mass Index (BMI): 45 Blood Pressure (mmHg): 149/88 Reference Range: 80 - 120 mg / dl Electronic Signature(s) Unsigned Entered ByYevonne [name] on 10/22/2022 09:01:25 Signature(s): Date(s): [name] ([id number]) [id number] Page 9 of 9

## 2022-10-24 MED ORDER — SODIUM CHLORIDE 0.9 % IV SOLN: INTRAVENOUS | Status: DC

## 2022-10-24 MED ORDER — CEFAZOLIN SODIUM-DEXTROSE 2-4 GM/100ML-% IV SOLN
2.0000 g | INTRAVENOUS | Status: AC
  Administered 2022-10-25: 2 g via INTRAVENOUS

## 2022-10-24 MED ORDER — ORAL CARE MOUTH RINSE: 15.0000 mL | Freq: Once | OROMUCOSAL | Status: AC

## 2022-10-24 MED ORDER — CHLORHEXIDINE GLUCONATE 0.12 % MT SOLN
15.0000 mL | Freq: Once | OROMUCOSAL | Status: AC
  Administered 2022-10-25: 15 mL via OROMUCOSAL

## 2022-10-24 MED ORDER — FAMOTIDINE 20 MG PO TABS
20.0000 mg | ORAL_TABLET | Freq: Once | ORAL | Status: AC
  Administered 2022-10-25: 20 mg via ORAL

## 2022-10-25 ENCOUNTER — Other Ambulatory Visit: Payer: Self-pay | Admitting: Psychiatry

## 2022-10-25 ENCOUNTER — Other Ambulatory Visit: Payer: Self-pay

## 2022-10-25 ENCOUNTER — Ambulatory Visit: Payer: Medicare Other | Admitting: Urgent Care

## 2022-10-25 ENCOUNTER — Encounter
Admission: RE | Disposition: A | Discharge status: Home / Self Care | Payer: Self-pay | Source: Ambulatory Visit | Attending: Urology

## 2022-10-25 ENCOUNTER — Ambulatory Visit
Admission: RE | Admit: 2022-10-25 | Discharge: 2022-10-25 | Disposition: A | Discharge status: Home / Self Care | Payer: Medicare Other | Source: Ambulatory Visit | Attending: Urology | Admitting: Urology

## 2022-10-25 ENCOUNTER — Ambulatory Visit: Payer: Medicare Other

## 2022-10-25 ENCOUNTER — Encounter: Payer: Self-pay | Admitting: Urology

## 2022-10-25 DIAGNOSIS — N3091 Cystitis, unspecified with hematuria: Secondary | ICD-10-CM | POA: Diagnosis not present

## 2022-10-25 DIAGNOSIS — E1122 Type 2 diabetes mellitus with diabetic chronic kidney disease: Secondary | ICD-10-CM | POA: Insufficient documentation

## 2022-10-25 DIAGNOSIS — R310 Gross hematuria: Secondary | ICD-10-CM | POA: Diagnosis present

## 2022-10-25 DIAGNOSIS — G3109 Other frontotemporal dementia: Secondary | ICD-10-CM | POA: Diagnosis not present

## 2022-10-25 DIAGNOSIS — F0283 Dementia in other diseases classified elsewhere, unspecified severity, with mood disturbance: Secondary | ICD-10-CM | POA: Insufficient documentation

## 2022-10-25 DIAGNOSIS — Z86718 Personal history of other venous thrombosis and embolism: Secondary | ICD-10-CM | POA: Diagnosis not present

## 2022-10-25 DIAGNOSIS — R9341 Abnormal radiologic findings on diagnostic imaging of renal pelvis, ureter, or bladder: Secondary | ICD-10-CM

## 2022-10-25 DIAGNOSIS — N1330 Unspecified hydronephrosis: Secondary | ICD-10-CM

## 2022-10-25 DIAGNOSIS — Z6837 Body mass index (BMI) 37.0-37.9, adult: Secondary | ICD-10-CM | POA: Diagnosis not present

## 2022-10-25 DIAGNOSIS — F3178 Bipolar disorder, in full remission, most recent episode mixed: Secondary | ICD-10-CM

## 2022-10-25 DIAGNOSIS — I130 Hypertensive heart and chronic kidney disease with heart failure and stage 1 through stage 4 chronic kidney disease, or unspecified chronic kidney disease: Secondary | ICD-10-CM | POA: Diagnosis not present

## 2022-10-25 DIAGNOSIS — N183 Chronic kidney disease, stage 3 unspecified: Secondary | ICD-10-CM | POA: Insufficient documentation

## 2022-10-25 DIAGNOSIS — Z7901 Long term (current) use of anticoagulants: Secondary | ICD-10-CM | POA: Diagnosis not present

## 2022-10-25 DIAGNOSIS — Z8719 Personal history of other diseases of the digestive system: Secondary | ICD-10-CM | POA: Diagnosis not present

## 2022-10-25 DIAGNOSIS — Z01818 Encounter for other preprocedural examination: Secondary | ICD-10-CM

## 2022-10-25 DIAGNOSIS — I509 Heart failure, unspecified: Secondary | ICD-10-CM | POA: Insufficient documentation

## 2022-10-25 DIAGNOSIS — F411 Generalized anxiety disorder: Secondary | ICD-10-CM

## 2022-10-25 DIAGNOSIS — G4730 Sleep apnea, unspecified: Secondary | ICD-10-CM | POA: Diagnosis not present

## 2022-10-25 DIAGNOSIS — E669 Obesity, unspecified: Secondary | ICD-10-CM | POA: Diagnosis not present

## 2022-10-25 DIAGNOSIS — N3289 Other specified disorders of bladder: Secondary | ICD-10-CM

## 2022-10-25 DIAGNOSIS — G4701 Insomnia due to medical condition: Secondary | ICD-10-CM

## 2022-10-25 HISTORY — PX: CYSTOSCOPY W/ RETROGRADES: SHX1426

## 2022-10-25 LAB — GLUCOSE, CAPILLARY
Glucose-Capillary: 139 mg/dL — ABNORMAL HIGH (ref 70–99)
Glucose-Capillary: 145 mg/dL — ABNORMAL HIGH (ref 70–99)

## 2022-10-25 SURGERY — CYSTOSCOPY, WITH RETROGRADE PYELOGRAM
Anesthesia: General | Site: Ureter | Laterality: Right

## 2022-10-25 MED ORDER — FAMOTIDINE 20 MG PO TABS
ORAL_TABLET | ORAL | Status: AC
  Filled 2022-10-25: qty 1

## 2022-10-25 MED ORDER — ACETAMINOPHEN 10 MG/ML IV SOLN: 1000.0000 mg | Freq: Once | INTRAVENOUS | Status: DC | PRN

## 2022-10-25 MED ORDER — FENTANYL CITRATE (PF) 100 MCG/2ML IJ SOLN
INTRAMUSCULAR | Status: DC | PRN
  Administered 2022-10-25 (×2): 50 ug via INTRAVENOUS

## 2022-10-25 MED ORDER — IOHEXOL 180 MG/ML  SOLN
INTRAMUSCULAR | Status: DC | PRN
  Administered 2022-10-25: 10 mL

## 2022-10-25 MED ORDER — LIDOCAINE HCL (CARDIAC) PF 100 MG/5ML IV SOSY
PREFILLED_SYRINGE | INTRAVENOUS | Status: DC | PRN
  Administered 2022-10-25: 100 mg via INTRAVENOUS

## 2022-10-25 MED ORDER — SODIUM CHLORIDE 0.9 % IR SOLN
Status: DC | PRN
  Administered 2022-10-25: 3000 mL via INTRAVESICAL

## 2022-10-25 MED ORDER — PROPOFOL 10 MG/ML IV BOLUS
INTRAVENOUS | Status: DC | PRN
  Administered 2022-10-25: 120 mg via INTRAVENOUS

## 2022-10-25 MED ORDER — OXYCODONE HCL 5 MG/5ML PO SOLN: 5.0000 mg | Freq: Once | ORAL | Status: DC | PRN

## 2022-10-25 MED ORDER — CHLORHEXIDINE GLUCONATE 0.12 % MT SOLN
OROMUCOSAL | Status: AC
  Filled 2022-10-25: qty 15

## 2022-10-25 MED ORDER — LACTATED RINGERS IV SOLN: INTRAVENOUS | Status: DC | PRN

## 2022-10-25 MED ORDER — FENTANYL CITRATE (PF) 100 MCG/2ML IJ SOLN
INTRAMUSCULAR | Status: AC
  Filled 2022-10-25: qty 2

## 2022-10-25 MED ORDER — ONDANSETRON HCL 4 MG/2ML IJ SOLN
INTRAMUSCULAR | Status: DC | PRN
  Administered 2022-10-25: 4 mg via INTRAVENOUS

## 2022-10-25 MED ORDER — CEFAZOLIN SODIUM-DEXTROSE 2-4 GM/100ML-% IV SOLN
INTRAVENOUS | Status: AC
  Filled 2022-10-25: qty 100

## 2022-10-25 MED ORDER — FENTANYL CITRATE (PF) 100 MCG/2ML IJ SOLN: 25.0000 ug | INTRAMUSCULAR | Status: DC | PRN

## 2022-10-25 MED ORDER — PROPOFOL 10 MG/ML IV BOLUS
INTRAVENOUS | Status: AC
  Filled 2022-10-25: qty 20

## 2022-10-25 MED ORDER — ONDANSETRON HCL 4 MG/2ML IJ SOLN: 4.0000 mg | Freq: Once | INTRAMUSCULAR | Status: DC | PRN

## 2022-10-25 MED ORDER — ONDANSETRON HCL 4 MG/2ML IJ SOLN
INTRAMUSCULAR | Status: AC
  Filled 2022-10-25: qty 2

## 2022-10-25 MED ORDER — PHENYLEPHRINE HCL (PRESSORS) 10 MG/ML IV SOLN
INTRAVENOUS | Status: DC | PRN
  Administered 2022-10-25: 160 ug via INTRAVENOUS
  Administered 2022-10-25 (×3): 80 ug via INTRAVENOUS

## 2022-10-25 MED ORDER — SUGAMMADEX SODIUM 200 MG/2ML IV SOLN
INTRAVENOUS | Status: DC | PRN
  Administered 2022-10-25: 400 mg via INTRAVENOUS

## 2022-10-25 MED ORDER — OXYCODONE HCL 5 MG PO TABS: 5.0000 mg | ORAL_TABLET | Freq: Once | ORAL | Status: DC | PRN

## 2022-10-25 MED ORDER — EPHEDRINE SULFATE (PRESSORS) 50 MG/ML IJ SOLN
INTRAMUSCULAR | Status: DC | PRN
  Administered 2022-10-25: 5 mg via INTRAVENOUS
  Administered 2022-10-25: 10 mg via INTRAVENOUS

## 2022-10-25 MED ORDER — ROCURONIUM BROMIDE 100 MG/10ML IV SOLN
INTRAVENOUS | Status: DC | PRN
  Administered 2022-10-25: 50 mg via INTRAVENOUS

## 2022-10-25 SURGICAL SUPPLY — 34 items
BAG DRAIN SIEMENS DORNER NS (MISCELLANEOUS) ×2 IMPLANT
BAG DRN NS LF (MISCELLANEOUS) ×2
BAG DRN RND TRDRP ANRFLXCHMBR (UROLOGICAL SUPPLIES) ×2
BAG URINE DRAIN 2000ML AR STRL (UROLOGICAL SUPPLIES) ×2 IMPLANT
BRUSH SCRUB EZ  4% CHG (MISCELLANEOUS) ×2
BRUSH SCRUB EZ 1% IODOPHOR (MISCELLANEOUS) ×2 IMPLANT
BRUSH SCRUB EZ 4% CHG (MISCELLANEOUS) ×2 IMPLANT
CATH FOL 2WAY LX 18X30 (CATHETERS) ×3 IMPLANT
CATH URETL OPEN 5X70 (CATHETERS) ×2 IMPLANT
DRAPE UTILITY 15X26 TOWEL STRL (DRAPES) ×2 IMPLANT
DRSG TELFA 3X4 N-ADH STERILE (GAUZE/BANDAGES/DRESSINGS) ×1 IMPLANT
ELECT LOOP 22F BIPOLAR SML (ELECTROSURGICAL)
ELECT REM PT RETURN 9FT ADLT (ELECTROSURGICAL)
ELECTRODE LOOP 22F BIPOLAR SML (ELECTROSURGICAL) IMPLANT
ELECTRODE REM PT RTRN 9FT ADLT (ELECTROSURGICAL) IMPLANT
GAUZE 4X4 16PLY ~~LOC~~+RFID DBL (SPONGE) ×4 IMPLANT
GLOVE BIOGEL PI IND STRL 7.5 (GLOVE) ×2 IMPLANT
GOWN STRL REUS W/ TWL LRG LVL3 (GOWN DISPOSABLE) ×2 IMPLANT
GOWN STRL REUS W/ TWL XL LVL3 (GOWN DISPOSABLE) ×2 IMPLANT
GOWN STRL REUS W/TWL LRG LVL3 (GOWN DISPOSABLE) ×2
GOWN STRL REUS W/TWL XL LVL3 (GOWN DISPOSABLE) ×2
GUIDEWIRE STR DUAL SENSOR (WIRE) ×2 IMPLANT
IV NS IRRIG 3000ML ARTHROMATIC (IV SOLUTION) ×3 IMPLANT
KIT TURNOVER CYSTO (KITS) ×2 IMPLANT
LOOP CUT BIPOLAR 24F LRG (ELECTROSURGICAL) IMPLANT
NDL SAFETY ECLIP 18X1.5 (MISCELLANEOUS) ×1 IMPLANT
PACK CYSTO AR (MISCELLANEOUS) ×2 IMPLANT
PAD ARMBOARD 7.5X6 YLW CONV (MISCELLANEOUS) ×2 IMPLANT
SET CYSTO W/LG BORE CLAMP LF (SET/KITS/TRAYS/PACK) ×2 IMPLANT
SET IRRIG Y TYPE TUR BLADDER L (SET/KITS/TRAYS/PACK) ×2 IMPLANT
SURGILUBE 2OZ TUBE FLIPTOP (MISCELLANEOUS) ×2 IMPLANT
SYR TOOMEY IRRIG 70ML (MISCELLANEOUS) ×2
SYRINGE TOOMEY IRRIG 70ML (MISCELLANEOUS) ×2 IMPLANT
WATER STERILE IRR 500ML POUR (IV SOLUTION) ×2 IMPLANT

## 2022-10-25 NOTE — Anesthesia Procedure Notes (Signed)
Procedure Name: Intubation Date/Time: 10/25/2022 12:14 PM  Performed by: Rodney Booze, CRNAPre-anesthesia Checklist: Patient identified, Emergency Drugs available, Suction available and Patient being monitored Patient Re-evaluated:Patient Re-evaluated prior to induction Oxygen Delivery Method: Circle system utilized Preoxygenation: Pre-oxygenation with 100% oxygen Induction Type: IV induction Ventilation: Mask ventilation without difficulty Laryngoscope Size: McGraph and 3 Grade View: Grade I Tube type: Oral Tube size: 7.5 mm Number of attempts: 1 Airway Equipment and Method: Stylet and Oral airway Placement Confirmation: ETT inserted through vocal cords under direct vision, positive ETCO2 and breath sounds checked- equal and bilateral Secured at: 22 cm Tube secured with: Tape Dental Injury: Teeth and Oropharynx as per pre-operative assessment

## 2022-10-25 NOTE — Op Note (Signed)
Date of procedure: 10/25/22  Preoperative diagnosis:  Gross hematuria Possible bladder tumor Right hydronephrosis  Postoperative diagnosis:  No evidence of tumor or hydronephrosis  Procedure: Cystoscopy, right retrograde pyelogram with intraoperative interpretation  Surgeon: Legrand Rams, MD  Anesthesia: General  Complications: None  Intraoperative findings:  Wide open prostatic fossa from prior TURP Minimal posterior wall catheter cystitis, no suspicious lesions, ureteral orifices orthotopic bilaterally J hooking of right distal ureter from prostate, no filling defects or significant hydronephrosis, brisk efflux from right ureter after retrograde pyelogram performed  EBL: None  Specimens: None  Drains: 18 French Foley  Indication: [name] is a 66 y.o. comorbid patient with neurogenic bladder managed with chronic Foley catheter, recently admitted with hematuria and Foley was malpositioned, CT showed some right-sided hydronephrosis and a possible bladder mass.  After reviewing the management options for treatment, they elected to proceed with the above surgical procedure(s). We have discussed the potential benefits and risks of the procedure, side effects of the proposed treatment, the likelihood of the patient achieving the goals of the procedure, and any potential problems that might occur during the procedure or recuperation. Informed consent has been obtained.  Description of procedure:  The patient was taken to the operating room and general anesthesia was induced. SCDs were placed for DVT prophylaxis. The patient was placed in the dorsal lithotomy position(positioning very challenging secondary to contractures), prepped and draped in the usual sterile fashion, and preoperative antibiotics were administered. A preoperative time-out was performed.  The old Foley catheter was removed.  A 21 French Foley advanced easily into the urethra proximally towards the  bladder.  The the urethra was grossly normal, and the prostatic fossa was wide open from what appeared to be a prior TURP.  Thorough cystoscopy showed some minimal catheter cystitis at the posterior bladder wall, but no suspicious lesions, and the ureteral orifices were orthotopic bilaterally.  The 5 [place] access catheter was advanced into the right ureteral orifice and a retrograde pyelogram showed some J hooking from the prostate, but no filling defects or significant hydronephrosis.  When the access catheter was removed there was brisk drainage of contrast from the right kidney and efflux from the right ureter.  With the bladder decompressed the right ureter continued to efflux urine.  The bladder was filled and a 18 [place] Foley was placed easily into the bladder with return of clear urine, 10 mL placed in the balloon.  Disposition: Stable to PACU  Plan: Keep scheduled follow-up with me in August 2024 Continue monthly chronic Foley changes  Legrand Rams, MD

## 2022-10-25 NOTE — Anesthesia Postprocedure Evaluation (Signed)
Anesthesia Post Note  [name]  Procedure(s) Performed: CYSTOSCOPY WITH RETROGRADE PYELOGRAM (Right: Ureter)  Patient location during evaluation: PACU Anesthesia Type: General Level of consciousness: awake and alert Pain management: pain level controlled Vital Signs Assessment: post-procedure vital signs reviewed and stable Respiratory status: spontaneous breathing, nonlabored ventilation, respiratory function stable and patient connected to nasal cannula oxygen Cardiovascular status: blood pressure returned to baseline and stable Postop Assessment: no apparent nausea or vomiting Anesthetic complications: no   No notable events documented.   Last Vitals:  Vitals:   10/25/22 1315 10/25/22 1330  BP: (!) 167/97 (!) 154/90  Pulse: 93 84  Resp: 14 13  Temp: (!) 36.1 C (!) 36.1 C  SpO2: 98% 96%    Last Pain:  Vitals:   10/25/22 1330  TempSrc:   PainSc: 0-No pain                 Corinda Gubler

## 2022-10-25 NOTE — Transfer of Care (Signed)
Immediate Anesthesia Transfer of Care Note  [name]  Procedure(s) Performed: CYSTOSCOPY WITH RETROGRADE PYELOGRAM (Right: Ureter)  Patient Location: PACU  Anesthesia Type:General  Level of Consciousness: awake and drowsy  Airway & Oxygen Therapy: Patient Spontanous Breathing and Patient connected to face mask oxygen  Post-op Assessment: Report given to RN and Post -op Vital signs reviewed and stable  Post vital signs: stable  Last Vitals:  Vitals Value Taken Time  BP 186/90 10/25/22 1249  Temp 36.1 C 10/25/22 1249  Pulse 84 10/25/22 1253  Resp 14 10/25/22 1253  SpO2 99 % 10/25/22 1253  Vitals shown include unvalidated device data.  Last Pain:  Vitals:   10/25/22 1249  TempSrc:   PainSc: Asleep      Patients Stated Pain Goal: 0 (10/25/22 1006)  Complications: No notable events documented.

## 2022-10-25 NOTE — H&P (Signed)
10/25/22 11:49 AM   [name] [birth year] [id number]  CC: Hematuria, possible bladder mass, right hydronephrosis  HPI: Very comorbid 67 year old male with neurogenic bladder managed with chronic Foley, recently hospitalized in March 2024 with hematuria, CT showed malpositioned Foley catheter, unilateral right-sided hydronephrosis, possible bladder mass.  This could also represent a median lobe or hematuria from the malpositioned Foley catheter.  Hydronephrosis only improved somewhat on renal ultrasound after repositioning Foley catheter.  Ultimately they opted for OR for further evaluation of the bladder, possible TURBT, right retrograde pyelogram and further evaluation of right-sided hydronephrosis.   PMH: Past Medical History:  Diagnosis Date   Acute exacerbation of CHF (congestive heart failure) (HCC)    Anemia    Bipolar disorder (HCC)    Bladder mass    Cellulitis    Chronic anticoagulation    Chronic indwelling Foley catheter    CKD (chronic kidney disease) stage 3, GFR 30-59 ml/min (HCC)    Colostomy in place (HCC)    Diabetes mellitus without complication (HCC)    Frontotemporal dementia (HCC)    Heel ulcer (HCC)    Left   Hematuria    History of blood clots    Hydronephrosis of right kidney    Hyperkalemia    Hypertension    Hyponatremia    Lithium toxicity    Metabolic acidosis    Neurogenic bladder    Obesity    Sacral decubitus ulcer    Sepsis (HCC)    TBI (traumatic brain injury) (HCC)    UTI (urinary tract infection)    VRE (vancomycin resistant enterococcus) culture positive     Surgical History: Past Surgical History:  Procedure Laterality Date   BACK SURGERY     CARPAL TUNNEL RELEASE Bilateral    COLON SURGERY     COLOSTOMY     IR PERC TUN PERIT CATH WO PORT S&I /IMAG  10/11/2021   IR REMOVAL TUN CV CATH W/O FL  11/19/2021   IR US GUIDE VASC ACCESS RIGHT  10/11/2021   SACRAL DECUBITUS ULCER EXCISION     TONSILLECTOMY         Family History: Family History  Problem Relation Age of Onset   Depression Father    Deep vein thrombosis Father     Social History:  reports that he has never smoked. He has never been exposed to tobacco smoke. He has never used smokeless tobacco. He reports that he does not currently use alcohol. He reports that he does not use drugs.  Physical Exam: BP (!) 159/88   Pulse 91   Temp (!) 97.5 F (36.4 C) (Temporal)   Resp 18   Ht 5\' 9"  (1.753 m)   Wt 116 kg   SpO2 99%   BMI 37.77 kg/m    Constitutional:  Alert and oriented, No acute distress. Cardiovascular: Regular rate and rhythmw. Respiratory: Clear to auscultation bilaterally GI: Abdomen is soft, nontender, nondistended, no abdominal masses   Laboratory Data: Urine culture 09/19/2022 no growth   Assessment & Plan:   Comorbid 68 year old male with neurogenic bladder managed with chronic Foley, recently hospitalized for gross hematuria and found to have a possible bladder mass versus median lobe versus blood clot in the bladder on CT with unilateral right-sided hydronephrosis that only improved somewhat after Foley catheter repositioning.  We discussed transurethral resection of bladder tumor (TURBT) and risks and benefits at length. This is typically a 1 to 2-hour procedure done under general anesthesia in the operating room.  A  scope is inserted through the urethra and used to resect abnormal tissue within the bladder, which is then sent to the pathologist to determine grade and stage of the tumor.  Risks include bleeding, infection, need for temporary Foley placement, and bladder perforation.  Treatment strategies are based on the type of tumor and depth of invasion.  We briefly reviewed the different treatment pathways for non-muscle invasive and muscle invasive bladder cancer.  OR today for cystoscopy, right retrograde pyelogram, possible right diagnostic ureteroscopy, possible bladder biopsy/TURBT   Legrand Rams, MD 10/25/2022  [facility] [address], [address] [place], [place] [address] [phone]

## 2022-10-25 NOTE — Discharge Instructions (Signed)

## 2022-10-25 NOTE — Anesthesia Preprocedure Evaluation (Addendum)
Anesthesia Evaluation  Patient identified by MRN, date of birth, ID band Patient awake    Reviewed: Allergy & Precautions, NPO status , Patient's Chart, lab work & pertinent test results  History of Anesthesia Complications Negative for: history of anesthetic complications  Airway Mallampati: III  TM Distance: >3 FB Neck ROM: Full    Dental no notable dental hx. (+) Teeth Intact   Pulmonary sleep apnea , neg COPD, Patient abstained from smoking.Not current smoker   Pulmonary exam normal breath sounds clear to auscultation       Cardiovascular Exercise Tolerance: Good METShypertension, Pt. on medications +CHF  (-) CAD and (-) Past MI (-) dysrhythmias  Rhythm:Regular Rate:Normal - Systolic murmurs 1. Left ventricular ejection fraction, by estimation, is 60 to 65%. The  left ventricle has normal function. The left ventricle has no regional  wall motion abnormalities. Left ventricular diastolic parameters are  consistent with Grade III diastolic  dysfunction (restrictive).   2. Right ventricular systolic function is normal. The right ventricular  size is normal.   3. Left atrial size was mildly dilated.   4. The mitral valve is normal in structure. No evidence of mitral valve  regurgitation. No evidence of mitral stenosis.   5. The aortic valve is normal in structure. Aortic valve regurgitation is  mild. Aortic valve sclerosis is present, with no evidence of aortic valve  stenosis.   6. The inferior vena cava is normal in size with greater than 50%  respiratory variability, suggesting right atrial pressure of 3 mmHg.     Neuro/Psych  PSYCHIATRIC DISORDERS Anxiety  Bipolar Disorder  Dementia Patient AO x 4negative neurological ROS     GI/Hepatic ,neg GERD  ,,(+)     (-) substance abuse    Endo/Other  diabetes    Renal/GU CRFRenal disease     Musculoskeletal   Abdominal  (+) + obese  Peds  Hematology    Anesthesia Other Findings Past Medical History: No date: Acute exacerbation of CHF (congestive heart failure) (HCC) No date: Anemia No date: Bipolar disorder (HCC) No date: Bladder mass No date: Cellulitis No date: Chronic anticoagulation No date: Chronic indwelling Foley catheter No date: CKD (chronic kidney disease) stage 3, GFR 30-59 ml/min (HCC) No date: Colostomy in place [facility]) No date: Diabetes mellitus without complication (HCC) No date: Frontotemporal dementia (HCC) No date: Heel ulcer (HCC)     Comment:  Left No date: Hematuria No date: History of blood clots No date: Hydronephrosis of right kidney No date: Hyperkalemia No date: Hypertension No date: Hyponatremia No date: Lithium toxicity No date: Metabolic acidosis No date: Neurogenic bladder No date: Obesity No date: Sacral decubitus ulcer No date: Sepsis (HCC) No date: TBI (traumatic brain injury) (HCC) No date: UTI (urinary tract infection) No date: VRE (vancomycin resistant enterococcus) culture positive  Reproductive/Obstetrics                              Anesthesia Physical Anesthesia Plan  ASA: 3  Anesthesia Plan: General   Post-op Pain Management: Ofirmev IV (intra-op)*   Induction: Intravenous  PONV Risk Score and Plan: 3 and Ondansetron and Dexamethasone  Airway Management Planned: Oral ETT  Additional Equipment: None  Intra-op Plan:   Post-operative Plan: Extubation in OR  Informed Consent: I have reviewed the patients History and Physical, chart, labs and discussed the procedure including the risks, benefits and alternatives for the proposed anesthesia with the patient or authorized representative  who has indicated his/her understanding and acceptance.     Dental advisory given  Plan Discussed with: CRNA and Surgeon  Anesthesia Plan Comments: (Discussed risks of anesthesia with patient, including PONV, sore throat, lip/dental/eye damage. Rare risks  discussed as well, such as cardiorespiratory and neurological sequelae, and allergic reactions. Discussed the role of CRNA in patient's perioperative care. Patient understands.)         Anesthesia Quick Evaluation

## 2022-10-26 ENCOUNTER — Encounter: Payer: Self-pay | Admitting: Urology

## 2022-10-31 ENCOUNTER — Telehealth: Payer: Self-pay

## 2022-10-31 NOTE — Telephone Encounter (Signed)
Incoming call from pt's wife who states that he patient is 6 days post cysto w/ retrograde pyelogram. She states that since Tuesday of this week the patient has c/o rectal pain. The pain is 5/10 and is constant. The patient denies, fever or chills. He currently has a foley catheter in place. Bowel movements have been normal, pt has a colostomy. The patient has not tried anything for pain relief. Advised pt on the use of OTC pain reliving agents such as tylenol. Advised pt and wife to follow directions listed on packaging, and to call back if no relief. Wife and patient voiced understanding.

## 2022-11-06 ENCOUNTER — Other Ambulatory Visit: Payer: Self-pay

## 2022-11-06 DIAGNOSIS — N390 Urinary tract infection, site not specified: Secondary | ICD-10-CM | POA: Diagnosis not present

## 2022-11-06 DIAGNOSIS — R531 Weakness: Secondary | ICD-10-CM | POA: Diagnosis not present

## 2022-11-06 DIAGNOSIS — I509 Heart failure, unspecified: Secondary | ICD-10-CM | POA: Insufficient documentation

## 2022-11-06 DIAGNOSIS — E1122 Type 2 diabetes mellitus with diabetic chronic kidney disease: Secondary | ICD-10-CM | POA: Insufficient documentation

## 2022-11-06 DIAGNOSIS — R509 Fever, unspecified: Secondary | ICD-10-CM | POA: Diagnosis present

## 2022-11-06 DIAGNOSIS — D649 Anemia, unspecified: Secondary | ICD-10-CM | POA: Diagnosis not present

## 2022-11-06 DIAGNOSIS — I130 Hypertensive heart and chronic kidney disease with heart failure and stage 1 through stage 4 chronic kidney disease, or unspecified chronic kidney disease: Secondary | ICD-10-CM | POA: Insufficient documentation

## 2022-11-06 DIAGNOSIS — E871 Hypo-osmolality and hyponatremia: Secondary | ICD-10-CM | POA: Insufficient documentation

## 2022-11-06 DIAGNOSIS — N189 Chronic kidney disease, unspecified: Secondary | ICD-10-CM | POA: Diagnosis not present

## 2022-11-06 LAB — COMPREHENSIVE METABOLIC PANEL
ALT: 17 U/L (ref 0–44)
AST: 18 U/L (ref 15–41)
Albumin: 3.1 g/dL — ABNORMAL LOW (ref 3.5–5.0)
Alkaline Phosphatase: 95 U/L (ref 38–126)
Anion gap: 9 (ref 5–15)
BUN: 35 mg/dL — ABNORMAL HIGH (ref 8–23)
CO2: 17 mmol/L — ABNORMAL LOW (ref 22–32)
Calcium: 8.4 mg/dL — ABNORMAL LOW (ref 8.9–10.3)
Chloride: 103 mmol/L (ref 98–111)
Creatinine, Ser: 2.16 mg/dL — ABNORMAL HIGH (ref 0.61–1.24)
GFR, Estimated: 33 mL/min — ABNORMAL LOW (ref >60)
Glucose, Bld: 193 mg/dL — ABNORMAL HIGH (ref 70–99)
Potassium: 4.2 mmol/L (ref 3.5–5.1)
Sodium: 129 mmol/L — ABNORMAL LOW (ref 135–145)
Total Bilirubin: 0.4 mg/dL (ref 0.3–1.2)
Total Protein: 8.1 g/dL (ref 6.5–8.1)

## 2022-11-06 LAB — CBC WITH DIFFERENTIAL/PLATELET
Abs Immature Granulocytes: 0.02 10*3/uL (ref 0.00–0.07)
Basophils Absolute: 0 10*3/uL (ref 0.0–0.1)
Basophils Relative: 0 %
Eosinophils Absolute: 0.1 10*3/uL (ref 0.0–0.5)
Eosinophils Relative: 2 %
HCT: 30 % — ABNORMAL LOW (ref 39.0–52.0)
Hemoglobin: 9 g/dL — ABNORMAL LOW (ref 13.0–17.0)
Immature Granulocytes: 0 %
Lymphocytes Relative: 14 %
Lymphs Abs: 1.2 10*3/uL (ref 0.7–4.0)
MCH: 25.9 pg — ABNORMAL LOW (ref 26.0–34.0)
MCHC: 30 g/dL (ref 30.0–36.0)
MCV: 86.5 fL (ref 80.0–100.0)
Monocytes Absolute: 0.8 10*3/uL (ref 0.1–1.0)
Monocytes Relative: 9 %
Neutro Abs: 6.2 10*3/uL (ref 1.7–7.7)
Neutrophils Relative %: 75 %
Platelets: 240 10*3/uL (ref 150–400)
RBC: 3.47 MIL/uL — ABNORMAL LOW (ref 4.22–5.81)
RDW: 17.5 % — ABNORMAL HIGH (ref 11.5–15.5)
WBC: 8.3 10*3/uL (ref 4.0–10.5)
nRBC: 0 % (ref 0.0–0.2)

## 2022-11-06 NOTE — ED Triage Notes (Signed)
Pt reports fever for several days with temp of 101 at home. Pt took tylenol at home ~30 min ago. Pt has chronic foley in place and reports urine has been dark. Pt denies pain. Pt alert and oriented following commands. Breathing unlabored with symmetric chest rise and fall.

## 2022-11-06 NOTE — ED Triage Notes (Signed)
EMS brings pt in from home for reports of fever for several days; 1gm tylenol admin PTA, chronic foley with dark urine noted today by EMS

## 2022-11-07 ENCOUNTER — Emergency Department
Admission: EM | Admit: 2022-11-07 | Discharge: 2022-11-07 | Disposition: A | Discharge status: Home / Self Care | Payer: Medicare Other | Attending: Emergency Medicine | Admitting: Emergency Medicine

## 2022-11-07 DIAGNOSIS — N390 Urinary tract infection, site not specified: Secondary | ICD-10-CM

## 2022-11-07 LAB — URINALYSIS, ROUTINE W REFLEX MICROSCOPIC
Bilirubin Urine: NEGATIVE
Glucose, UA: NEGATIVE mg/dL
Ketones, ur: NEGATIVE mg/dL
Nitrite: NEGATIVE
Protein, ur: >=300 mg/dL — AB
Specific Gravity, Urine: 1.013 (ref 1.005–1.030)
WBC, UA: >50 WBC/hpf (ref 0–5)
pH: 5 (ref 5.0–8.0)

## 2022-11-07 LAB — LACTIC ACID, PLASMA: Lactic Acid, Venous: 1 mmol/L (ref 0.5–1.9)

## 2022-11-07 LAB — CULTURE, BLOOD (ROUTINE X 2)

## 2022-11-07 MED ORDER — CEPHALEXIN 500 MG PO CAPS: 500.0000 mg | ORAL_CAPSULE | Freq: Three times a day (TID) | ORAL | 0 refills | Status: DC

## 2022-11-07 MED ORDER — SODIUM CHLORIDE 0.9 % IV SOLN
1.0000 g | Freq: Once | INTRAVENOUS | Status: AC
  Administered 2022-11-07: 1 g via INTRAVENOUS
  Filled 2022-11-07: qty 10

## 2022-11-07 MED ORDER — SODIUM CHLORIDE 0.9 % IV BOLUS
1000.0000 mL | Freq: Once | INTRAVENOUS | Status: AC
  Administered 2022-11-07: 1000 mL via INTRAVENOUS

## 2022-11-07 NOTE — ED Notes (Signed)
Patient discharged at this time. Wheeled to lobby and assisted into vehicle. Breathing unlabored speaking in full sentences. Verbalized understanding of all discharge, follow up, and medication teaching. Discharged homed with all belongings.   

## 2022-11-07 NOTE — ED Provider Notes (Signed)
[facility] Provider Note    Event Date/Time   First MD Initiated Contact with Patient 11/07/22 0144     (approximate)  History   Chief Complaint: Fever  HPI  [name] is a 67 y.o. male with a past medical history of CHF, bipolar, CKD, diabetes, hypertension, presents to the emergency department for fever and weakness.  According to the patient since earlier today he has been feeling weak, measured temperature of 102 at home.  Patient states that history of recurrent urinary tract infections sepsis and admissions in the past.  Patient has a history of a prior colostomy, he also has a chronic Foley catheter.  Denies any abdominal pain.  Denies any cough or congestion.  Denies any vomiting.  Physical Exam   Triage Vital Signs: ED Triage Vitals  Enc Vitals Group     BP 11/06/22 2151 (!) 145/78     Pulse Rate 11/06/22 2151 72     Resp 11/06/22 2151 18     Temp 11/06/22 2151 99.8 F (37.7 C)     Temp Source 11/06/22 2151 Oral     SpO2 11/06/22 2151 96 %     Weight 11/06/22 2149 279 lb (126.6 kg)     Height 11/06/22 2149 5\' 6"  (1.676 m)     Head Circumference --      Peak Flow --      Pain Score 11/06/22 2149 0     Pain Loc --      Pain Edu? --      Excl. in GC? --     Most recent vital signs: Vitals:   11/06/22 2151 11/07/22 0150  BP: (!) 145/78 (!) 155/104  Pulse: 72 60  Resp: 18 18  Temp: 99.8 F (37.7 C) 98 F (36.7 C)  SpO2: 96% 100%    General: Awake, no distress.  CV:  Good peripheral perfusion.  Regular rate and rhythm  Resp:  Normal effort.  Equal breath sounds bilaterally.  Abd:  No distention.  Soft, nontender.  No rebound or guarding.  Colostomy present.  Benign abdomen. Other:  Foley catheter present.  Yellow appearing urine.   ED Results / Procedures / Treatments   MEDICATIONS ORDERED IN ED: Medications - No data to display   IMPRESSION / MDM / ASSESSMENT AND PLAN / ED COURSE  I reviewed the triage vital  signs and the nursing notes.  Patient's presentation is most consistent with acute presentation with potential threat to life or bodily function.  Patient presents emergency department for generalized weakness and a fever to 102 at home earlier today.  Currently patient is afebrile 98 degrees although on arrival is 99.8.  Reassuring vital signs with normal pulse rate normal respiratory rate 100% room air saturation.  Patient's lab work so far reassuring as well with a CBC showing a normal white blood cell count chronic anemia.  Chemistry shows chronic kidney disease but unchanged from historical values, mild hyponatremia.  Will IV hydrate while awaiting results.  We will send blood cultures as well as a lactic acid.  Patient is lab work is overall reassuring normal CBC normal white blood cell count.  Chemistry reassuring at baseline.  Lactic acid reassuringly normal at 1.0.  Patient's urinalysis does appear infected however this is a chronic Foley catheter sample.  Given the patient's reported fever we will cover with antibiotics.  We will dose IV Rocephin and discharged on oral Keflex.  Given the patient's reassuring lab work including a  normal white blood cell count and normal lactic acid I believe the patient will be able to go home with outpatient follow-up.  Patient agreeable to plan of care.  FINAL CLINICAL IMPRESSION(S) / ED DIAGNOSES   Fever Urinary tract infection   Note:  This document was prepared using Dragon voice recognition software and may include unintentional dictation errors.   Minna Antis, MD 11/07/22 [phone]

## 2022-11-07 NOTE — Discharge Instructions (Addendum)
Please take your antibiotics as prescribed for the entire course.  Return to the emergency department for any significant fever, vomiting unable to keep down your antibiotics, or any other symptom personally concerning to yourself.

## 2022-11-07 NOTE — ED Notes (Signed)
Patient provided with blanket per request.  

## 2022-11-07 NOTE — ED Notes (Signed)
RN called pt wife at pt request to inform her of pt discharge. Pt spouse states she will come pick up patient shortly.

## 2022-11-08 LAB — URINE CULTURE

## 2022-11-08 LAB — CULTURE, BLOOD (ROUTINE X 2)
Culture: NO GROWTH
Culture: NO GROWTH
Special Requests: ADEQUATE

## 2022-11-09 LAB — URINE CULTURE: Culture: >=100000 — AB

## 2022-11-09 LAB — CULTURE, BLOOD (ROUTINE X 2)

## 2022-11-10 LAB — URINE CULTURE

## 2022-11-10 LAB — CULTURE, BLOOD (ROUTINE X 2): Special Requests: ADEQUATE

## 2022-11-12 ENCOUNTER — Encounter: Payer: Medicare Other | Attending: Physician Assistant | Admitting: Physician Assistant

## 2022-11-12 DIAGNOSIS — L98412 Non-pressure chronic ulcer of buttock with fat layer exposed: Secondary | ICD-10-CM | POA: Insufficient documentation

## 2022-11-12 DIAGNOSIS — Z87820 Personal history of traumatic brain injury: Secondary | ICD-10-CM | POA: Diagnosis not present

## 2022-11-12 DIAGNOSIS — Z7901 Long term (current) use of anticoagulants: Secondary | ICD-10-CM | POA: Diagnosis not present

## 2022-11-12 DIAGNOSIS — L89154 Pressure ulcer of sacral region, stage 4: Secondary | ICD-10-CM | POA: Insufficient documentation

## 2022-11-12 DIAGNOSIS — I10 Essential (primary) hypertension: Secondary | ICD-10-CM | POA: Insufficient documentation

## 2022-11-12 DIAGNOSIS — L24A0 Irritant contact dermatitis due to friction or contact with body fluids, unspecified: Secondary | ICD-10-CM | POA: Insufficient documentation

## 2022-11-12 DIAGNOSIS — E11622 Type 2 diabetes mellitus with other skin ulcer: Secondary | ICD-10-CM | POA: Diagnosis not present

## 2022-11-12 DIAGNOSIS — F319 Bipolar disorder, unspecified: Secondary | ICD-10-CM | POA: Insufficient documentation

## 2022-11-12 DIAGNOSIS — L89623 Pressure ulcer of left heel, stage 3: Secondary | ICD-10-CM | POA: Diagnosis not present

## 2022-11-12 DIAGNOSIS — M6281 Muscle weakness (generalized): Secondary | ICD-10-CM | POA: Diagnosis not present

## 2022-11-12 DIAGNOSIS — L89613 Pressure ulcer of right heel, stage 3: Secondary | ICD-10-CM | POA: Diagnosis present

## 2022-11-12 LAB — CULTURE, BLOOD (ROUTINE X 2)

## 2022-11-12 NOTE — Progress Notes (Signed)
CLAYBORN, HEYSE ([id number]) [id number] Page 1 of 2 Visit Report for 11/12/2022 Chief Complaint Document Details Patient Name: Date of Service: [name] [facility] 11/12/2022 8:45 A M Medical Record Number: [id number] Patient Account Number: [id number] Date of Birth/Sex: Treating RN: 1956/04/22 (66 y.o. [name]) [name] Primary Care Provider: Dione Booze Other Clinician: Referring Provider: Treating Provider/Extender: [name] in Treatment: 50 Information Obtained from: Patient Chief Complaint Sacral, right gluteal, and bilateral heel ulcers Electronic Signature(s) Signed: 11/12/2022 9:05:18 AM By: Allen Derry PA-C Entered By: Allen Derry on 11/12/2022 09:05:17 -------------------------------------------------------------------------------- Problem List Details Patient Name: Date of Service: [name] [facility] 11/12/2022 8:45 A M Medical Record Number: [id number] Patient Account Number: [id number] Date of Birth/Sex: Treating RN: 30-Nov-1955 (66 y.o. 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 11/22/2021 No Yes M62.81 Muscle weakness (generalized) 11/22/2021 No Yes F31.9 Bipolar disorder, unspecified 11/22/2021 No Yes E11.622 Type 2 diabetes mellitus with other skin ulcer 11/22/2021 No Yes Oshields, [name] ([id number])  [id number] Page 2 of 2 I10 Essential (primary) hypertension 11/22/2021 No Yes Z79.01 Long term (current) use of anticoagulants 11/22/2021 No Yes 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 11/22/2021 No Yes Inactive Problems Resolved Problems Electronic Signature(s) Signed: 11/12/2022 9:05:15 AM By: Allen Derry PA-C Entered By: Allen Derry on 11/12/2022 09:05:14

## 2022-11-26 ENCOUNTER — Emergency Department: Payer: Medicare Other

## 2022-11-26 ENCOUNTER — Emergency Department
Admission: EM | Admit: 2022-11-26 | Discharge: 2022-11-26 | Disposition: A | Discharge status: Home / Self Care | Payer: Medicare Other | Attending: Emergency Medicine | Admitting: Emergency Medicine

## 2022-11-26 DIAGNOSIS — D649 Anemia, unspecified: Secondary | ICD-10-CM | POA: Diagnosis not present

## 2022-11-26 DIAGNOSIS — I82402 Acute embolism and thrombosis of unspecified deep veins of left lower extremity: Secondary | ICD-10-CM

## 2022-11-26 DIAGNOSIS — Z7901 Long term (current) use of anticoagulants: Secondary | ICD-10-CM | POA: Insufficient documentation

## 2022-11-26 DIAGNOSIS — R410 Disorientation, unspecified: Secondary | ICD-10-CM | POA: Diagnosis not present

## 2022-11-26 DIAGNOSIS — R4182 Altered mental status, unspecified: Secondary | ICD-10-CM | POA: Diagnosis present

## 2022-11-26 DIAGNOSIS — R319 Hematuria, unspecified: Secondary | ICD-10-CM | POA: Diagnosis not present

## 2022-11-26 DIAGNOSIS — N390 Urinary tract infection, site not specified: Secondary | ICD-10-CM

## 2022-11-26 LAB — CBC WITH DIFFERENTIAL/PLATELET
Abs Immature Granulocytes: 0.02 10*3/uL (ref 0.00–0.07)
Basophils Absolute: 0 10*3/uL (ref 0.0–0.1)
Basophils Relative: 1 %
Eosinophils Absolute: 0.2 10*3/uL (ref 0.0–0.5)
Eosinophils Relative: 3 %
HCT: 34.4 % — ABNORMAL LOW (ref 39.0–52.0)
Hemoglobin: 10.2 g/dL — ABNORMAL LOW (ref 13.0–17.0)
Immature Granulocytes: 0 %
Lymphocytes Relative: 15 %
Lymphs Abs: 1.2 10*3/uL (ref 0.7–4.0)
MCH: 25.8 pg — ABNORMAL LOW (ref 26.0–34.0)
MCHC: 29.7 g/dL — ABNORMAL LOW (ref 30.0–36.0)
MCV: 86.9 fL (ref 80.0–100.0)
Monocytes Absolute: 0.7 10*3/uL (ref 0.1–1.0)
Monocytes Relative: 9 %
Neutro Abs: 5.6 10*3/uL (ref 1.7–7.7)
Neutrophils Relative %: 72 %
Platelets: 188 10*3/uL (ref 150–400)
RBC: 3.96 MIL/uL — ABNORMAL LOW (ref 4.22–5.81)
RDW: 19.3 % — ABNORMAL HIGH (ref 11.5–15.5)
WBC: 7.8 10*3/uL (ref 4.0–10.5)
nRBC: 0 % (ref 0.0–0.2)

## 2022-11-26 LAB — URINALYSIS, COMPLETE (UACMP) WITH MICROSCOPIC
Bilirubin Urine: NEGATIVE
Glucose, UA: NEGATIVE mg/dL
Ketones, ur: NEGATIVE mg/dL
Nitrite: NEGATIVE
Protein, ur: >=300 mg/dL — AB
Specific Gravity, Urine: 1.012 (ref 1.005–1.030)
Squamous Epithelial / HPF: NONE SEEN /HPF (ref 0–5)
WBC, UA: >50 WBC/hpf (ref 0–5)
pH: 5 (ref 5.0–8.0)

## 2022-11-26 LAB — COMPREHENSIVE METABOLIC PANEL
ALT: 11 U/L (ref 0–44)
AST: 16 U/L (ref 15–41)
Albumin: 3 g/dL — ABNORMAL LOW (ref 3.5–5.0)
Alkaline Phosphatase: 104 U/L (ref 38–126)
Anion gap: 7 (ref 5–15)
BUN: 34 mg/dL — ABNORMAL HIGH (ref 8–23)
CO2: 20 mmol/L — ABNORMAL LOW (ref 22–32)
Calcium: 8.6 mg/dL — ABNORMAL LOW (ref 8.9–10.3)
Chloride: 110 mmol/L (ref 98–111)
Creatinine, Ser: 2.36 mg/dL — ABNORMAL HIGH (ref 0.61–1.24)
GFR, Estimated: 29 mL/min — ABNORMAL LOW (ref >60)
Glucose, Bld: 117 mg/dL — ABNORMAL HIGH (ref 70–99)
Potassium: 4.3 mmol/L (ref 3.5–5.1)
Sodium: 137 mmol/L (ref 135–145)
Total Bilirubin: 0.6 mg/dL (ref 0.3–1.2)
Total Protein: 8.4 g/dL — ABNORMAL HIGH (ref 6.5–8.1)

## 2022-11-26 LAB — APTT: aPTT: 44 seconds — ABNORMAL HIGH (ref 24–36)

## 2022-11-26 LAB — CULTURE, BLOOD (ROUTINE X 2)

## 2022-11-26 LAB — PROTIME-INR
INR: 1.5 — ABNORMAL HIGH (ref 0.8–1.2)
Prothrombin Time: 18.3 seconds — ABNORMAL HIGH (ref 11.4–15.2)

## 2022-11-26 LAB — PROCALCITONIN: Procalcitonin: 0.19 ng/mL

## 2022-11-26 LAB — LACTIC ACID, PLASMA: Lactic Acid, Venous: 1.2 mmol/L (ref 0.5–1.9)

## 2022-11-26 MED ORDER — CIPROFLOXACIN IN D5W 400 MG/200ML IV SOLN
400.0000 mg | Freq: Once | INTRAVENOUS | Status: AC
  Administered 2022-11-26: 400 mg via INTRAVENOUS
  Filled 2022-11-26: qty 200

## 2022-11-26 MED ORDER — CIPROFLOXACIN HCL 500 MG PO TABS: 500.0000 mg | ORAL_TABLET | Freq: Every day | ORAL | 0 refills | Status: AC

## 2022-11-26 NOTE — ED Provider Notes (Signed)
[facility] Provider Note    Event Date/Time   First MD Initiated Contact with Patient 11/26/22 [phone]     (approximate)   History   Altered Mental Status and Hematuria   HPI  [name] is a 67 y.o. male who presents to the ED for evaluation of Altered Mental Status and Hematuria   I reviewed medical DC summary from 3/27.  History of DM, TBI, neurogenic bladder with indwelling chronic Foley, ostomy.  History of DVT on Eliquis.  Previous CT imaging with possible bladder tumor, but subsequent cystoscopy on 4/26 without evidence of any tumor or residual hydronephrosis from previous admission. ED visit on 5/9, seen for fever and weakness.  Chronic Foley was not replaced, he was treated for UTI with Rocephin and Keflex.  Urine culture with E. coli resistant to ampicillin   Patient presents to the ED from home for evaluation of altered mentation and hematuria.  Reports his Foley catheter was routinely placed this past Friday.  Symptoms were reportedly present for the past few days, since Saturday, as indicated with the wife via EMS.  She reported concern that patient was coughing, he was more confused and "talking out of his head" as well as having hematuria within his Foley bag.  Here in the ED, patient has no complaints and reports he feels fine.  He is fully oriented   Physical Exam   Triage Vital Signs: ED Triage Vitals  Enc Vitals Group     BP 11/26/22 0624 (!) 156/89     Pulse Rate 11/26/22 0624 86     Resp 11/26/22 0624 14     Temp 11/26/22 0624 98.4 F (36.9 C)     Temp Source 11/26/22 0624 Oral     SpO2 11/26/22 0624 98 %     Weight 11/26/22 0618 279 lb (126.6 kg)     Height 11/26/22 0618 5\' 6"  (1.676 m)     Head Circumference --      Peak Flow --      Pain Score 11/26/22 0618 0     Pain Loc --      Pain Edu? --      Excl. in GC? --     Most recent vital signs: Vitals:   11/26/22 0624  BP: (!) 156/89  Pulse: 86  Resp: 14   Temp: 98.4 F (36.9 C)  SpO2: 98%    General: Awake, no distress.  Chronic ill-appearing. CV:  Good peripheral perfusion.  Resp:  Normal effort.  Abd:  No distention.  Soft and benign, ostomy bag present.  Pink stoma soft.  Indwelling Foley catheter draining cloudy urine. MSK:  No deformity noted.  Neuro:  No focal deficits appreciated. Other:     ED Results / Procedures / Treatments   Labs (all labs ordered are listed, but only abnormal results are displayed) Labs Reviewed  CBC WITH DIFFERENTIAL/PLATELET - Abnormal; Notable for the following components:      Result Value   RBC 3.96 (*)    Hemoglobin 10.2 (*)    HCT 34.4 (*)    MCH 25.8 (*)    MCHC 29.7 (*)    RDW 19.3 (*)    All other components within normal limits  CULTURE, BLOOD (ROUTINE X 2)  CULTURE, BLOOD (ROUTINE X 2)  URINE CULTURE  LACTIC ACID, PLASMA  LACTIC ACID, PLASMA  COMPREHENSIVE METABOLIC PANEL  PROTIME-INR  APTT  URINALYSIS, COMPLETE (UACMP) WITH MICROSCOPIC  PROCALCITONIN    EKG Sinus  rhythm with a rate of 88 bpm.  Normal axis and intervals.  No clear signs of acute ischemia.  RADIOLOGY   Official radiology report(s): No results found.  PROCEDURES and INTERVENTIONS:  .1-3 Lead EKG Interpretation  Performed by: Delton Prairie, MD Authorized by: Delton Prairie, MD     Interpretation: normal     ECG rate:  80   ECG rate assessment: normal     Rhythm: sinus rhythm     Ectopy: none     Conduction: normal     Medications - No data to display   IMPRESSION / MDM / ASSESSMENT AND PLAN / ED COURSE  I reviewed the triage vital signs and the nursing notes.  Differential diagnosis includes, but is not limited to, sepsis, metabolic encephalopathy, ICH, stroke, PNA  {Patient presents with symptoms of an acute illness or injury that is potentially life-threatening.  Patient presents from home via EMS due to male due to concerns for altered mentation.  He is fully oriented for me without  focal deficits.  Reassuring vital signs to begin with.  We will send for septal screening protocols considering his history of recurrent urosepsis.  Replace his Foley catheter for a clean urinalysis.  Workup is pending at the time of signout.  No leukocytosis is noted.  Chronic normocytic anemia.  We will CT head due to the concerns for altered mentation, as well as abdomen/pelvis      FINAL CLINICAL IMPRESSION(S) / ED DIAGNOSES   Final diagnoses:  Confusion     Rx / DC Orders   ED Discharge Orders     None        Note:  This document was prepared using Dragon voice recognition software and may include unintentional dictation errors.   Delton Prairie, MD 11/26/22 [phone]

## 2022-11-26 NOTE — ED Notes (Signed)
Report received from Nadia, RN.

## 2022-11-26 NOTE — ED Triage Notes (Signed)
Patient arrives with [facility] from home c/o AMS, cough, and blood in urine. Patient's wife state "patient has been talking out of his head all night." Per EMS, patient was a&o x4 with them. Patient has colostomy and foley; foley was changed on Friday per patient. Per EMS, sxs started Saturday night.  EMS vitals:  HR 90 Temp 37F 154 SBP 112 CBG 96% O2 on RA

## 2022-11-26 NOTE — ED Notes (Signed)
US at bedside at this time 

## 2022-11-26 NOTE — ED Provider Notes (Signed)
-----------------------------------------   7:10 AM on 11/26/2022 -----------------------------------------  Blood pressure (!) 156/89, pulse 86, temperature 98.4 F (36.9 C), temperature source Oral, resp. rate 14, height 5\' 6"  (1.676 m), weight 118.3 kg, SpO2 98 %.  Assuming care from Dr. Katrinka Blazing.  In short, [name] is a 67 y.o. male with a chief complaint of Altered Mental Status and Hematuria .  Refer to the original H&P for additional details.  The current plan of care is to follow-up CT imaging and additional labs for altered mental status.  ----------------------------------------- 9:36 AM on 11/26/2022 ----------------------------------------- CT head is negative for acute process, CT abdomen/pelvis negative for acute intra-abdominal process, does show similar appearing sacral ulcer for which patient has been following with wound care for.  No concerns for associated infection at this time, although urinalysis does appear concerning for UTI.  Patient had recent culture showing both E. coli and Pseudomonas sensitive to Cipro and patient given IV dose of Cipro here in the ED.  His wife is now at bedside and agrees that he has returned to his baseline mental status with no ongoing confusion.  Patient did complain of some pain in his left leg, ultrasound shows nonocclusive DVT.  He denies any chest pain or shortness of breath, vital signs are reassuring and I doubt PE at this time.  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  I do not feel his anticoagulation needs to be changed, but I did emphasize importance of taking medication regularly to both patient and wife.  He is appropriate for outpatient management with PCP and vascular surgery follow-up, was provided with referral along with renally dosed prescription for Cipro.  Wife counseled to have patient return to the ED for new or worsening symptoms,  patient and wife agree with plan.    Chesley Noon, MD 11/26/22 [phone]

## 2022-11-26 NOTE — ED Notes (Signed)
Xray tech at patient bedside. 

## 2022-11-26 NOTE — ED Notes (Signed)
Pt transported to CT at this time.

## 2022-11-27 LAB — CULTURE, BLOOD (ROUTINE X 2)

## 2022-11-27 LAB — URINE CULTURE

## 2022-11-28 LAB — URINE CULTURE: Culture: >=100000 — AB

## 2022-11-29 LAB — CULTURE, BLOOD (ROUTINE X 2)

## 2022-11-30 LAB — CULTURE, BLOOD (ROUTINE X 2)

## 2022-12-01 LAB — CULTURE, BLOOD (ROUTINE X 2)
Culture: NO GROWTH
Culture: NO GROWTH

## 2022-12-03 ENCOUNTER — Encounter: Payer: Medicare Other | Attending: Physician Assistant | Admitting: Physician Assistant

## 2022-12-03 DIAGNOSIS — F319 Bipolar disorder, unspecified: Secondary | ICD-10-CM | POA: Diagnosis not present

## 2022-12-03 DIAGNOSIS — Z87820 Personal history of traumatic brain injury: Secondary | ICD-10-CM | POA: Insufficient documentation

## 2022-12-03 DIAGNOSIS — L89613 Pressure ulcer of right heel, stage 3: Secondary | ICD-10-CM | POA: Insufficient documentation

## 2022-12-03 DIAGNOSIS — M6281 Muscle weakness (generalized): Secondary | ICD-10-CM | POA: Diagnosis not present

## 2022-12-03 DIAGNOSIS — Z7901 Long term (current) use of anticoagulants: Secondary | ICD-10-CM | POA: Diagnosis not present

## 2022-12-03 DIAGNOSIS — E11622 Type 2 diabetes mellitus with other skin ulcer: Secondary | ICD-10-CM | POA: Diagnosis not present

## 2022-12-03 DIAGNOSIS — I10 Essential (primary) hypertension: Secondary | ICD-10-CM | POA: Diagnosis not present

## 2022-12-03 DIAGNOSIS — L89623 Pressure ulcer of left heel, stage 3: Secondary | ICD-10-CM | POA: Diagnosis not present

## 2022-12-03 DIAGNOSIS — L24A0 Irritant contact dermatitis due to friction or contact with body fluids, unspecified: Secondary | ICD-10-CM | POA: Diagnosis not present

## 2022-12-03 DIAGNOSIS — L89154 Pressure ulcer of sacral region, stage 4: Secondary | ICD-10-CM | POA: Insufficient documentation

## 2022-12-03 DIAGNOSIS — L98412 Non-pressure chronic ulcer of buttock with fat layer exposed: Secondary | ICD-10-CM | POA: Insufficient documentation

## 2022-12-03 NOTE — Progress Notes (Signed)
MAGED, MOCZYGEMBA ([id number]) [id number] Page 1 of 4 Visit Report for 12/03/2022 Chief Complaint Document Details Patient Name: Date of Service: [name] [facility] 12/03/2022 8:45 A M Medical Record Number: [id number] Patient Account Number: [id number] Date of Birth/Sex: Treating RN: [birth year] (66 y.o. [name]) [name] Primary Care Provider: Dione Booze Other Clinician: Referring Provider: Treating Provider/Extender: [name] in Treatment: 45 Information Obtained from: Patient Chief Complaint Sacral, right gluteal, and bilateral heel ulcers Electronic Signature(s) Signed: 12/03/2022 8:54:30 AM By: Allen Derry PA-C Entered By: Allen Derry on 12/03/2022 08:54:30 -------------------------------------------------------------------------------- Physician Orders Details Patient Name: Date of Service: [name] [facility] 12/03/2022 8:45 A M Medical Record Number: [id number] Patient Account Number: [id number] Date of Birth/Sex: Treating RN: 18-Apr-1956 (66 y.o. [name] Primary Care Provider: Dione Booze Other Clinician: Referring Provider: Treating Provider/Extender: [name] in Treatment: 44 Verbal / Phone Orders: No Diagnosis Coding ICD-10 Coding Code Description L89.613 Pressure ulcer of right heel, stage 3 L89.623 Pressure ulcer of left heel, stage 3 L89.154 Pressure ulcer of sacral region, stage 4 L24.A0 Irritant contact dermatitis due to friction or contact with body fluids, unspecified L98.412 Non-pressure chronic ulcer of buttock with fat layer exposed M62.81 Muscle weakness (generalized) F31.9 Bipolar disorder, unspecified E11.622 Type 2 diabetes mellitus with other skin ulcer I10 Essential (primary) hypertension Z79.01 Long term (current) use of anticoagulants 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 Follow-up Appointments Return Appointment in 3  weeks. Home Health [facility] for wound care. May utilize formulary equivalent dressing for wound treatment orders unless otherwise specified. Home Health Nurse may visit PRN to address patients wound care needs. Beverly Gust [phone] **Please direct any NON-WOUND related issues/requests for orders to patient's Primary Care Physician. **If current dressing causes regression in wound condition, may D/C ordered dressing product/s and apply Normal Saline Moist Dressing daily until next Wound Healing Center or Other MD appointment. **Notify Wound Healing Center of regression in wound condition at [phone]. Bathing/ Shower/ Hygiene No tub bath. Anesthetic (Use 'Patient Medications' Section for Anesthetic Order Entry) Lidocaine applied to wound bed Edema Control - Lymphedema / Segmental Compressive Device / Other Elevate, Exercise Daily and A void Standing for Long Periods of Time. Elevate legs to the level of the heart and pump ankles as often as possible [name], [name] ([id number]) [id number] Page 2 of 4 Elevate leg(s) parallel to the floor when sitting. DO YOUR BEST to sleep in the bed at night. DO NOT sleep in your recliner. Long hours of sitting in a recliner leads to swelling of the legs and/or potential wounds on your backside. Off-Loading Gel wheelchair cushion Low air-loss mattress (Group 2) - Sleep in bed every night. Turn and reposition every 2 hours - keep pressure off of the sacrum and heels wounds Other: - PRAFO boot in bed keep pressure off of sacrum/gluteus and heels- Additional Orders / Instructions Follow Nutritious Diet and Increase Protein Intake Wound Treatment Wound #5 - Calcaneus Wound Laterality: Right Cleanser: Normal Saline 3 x Per Week/30 Days Discharge Instructions: Wash your hands with soap and water. Remove old dressing, discard into plastic bag and place into trash. Cleanse the wound with Normal Saline prior to  applying a clean dressing using gauze sponges, not tissues or cotton balls. Do not scrub or use excessive force. Pat dry using gauze sponges, not tissue or cotton balls. Cleanser: Soap and Water 3 x Per Week/30 Days Discharge Instructions: Gently cleanse wound with antibacterial soap, rinse and pat dry  prior to dressing wounds Prim Dressing: Hydrofera Blue Ready Transfer Foam, 2.5x2.5 (in/in) ary 3 x Per Week/30 Days Discharge Instructions: cut to size of wound Secondary Dressing: Gauze 3 x Per Week/30 Days Discharge Instructions: As directed: dry, moistened with saline or moistened with Dakins Solution Secured With: Medipore T - 70M Medipore H Soft Cloth Surgical T ape ape, 2x2 (in/yd) 3 x Per Week/30 Days Wound #7 - Sacrum Cleanser: Normal Saline 1 x Per Day/30 Days Discharge Instructions: Wash your hands with soap and water. Remove old dressing, discard into plastic bag and place into trash. Cleanse the wound with Normal Saline prior to applying a clean dressing using gauze sponges, not tissues or cotton balls. Do not scrub or use excessive force. Pat dry using gauze sponges, not tissue or cotton balls. Cleanser: Soap and Water 1 x Per Day/30 Days Discharge Instructions: Gently cleanse wound with antibacterial soap, rinse and pat dry prior to dressing wounds Topical: calmoseptine 1 x Per Day/30 Days Discharge Instructions: apply to excoriated areas / periwound Prim Dressing: Hydrofera Blue Ready Transfer Foam, 4x5 (in/in) 1 x Per Day/30 Days ary Discharge Instructions: Apply Hydrofera Blue Ready to wound bed as directed Secondary Dressing: ABD Pad 5x9 (in/in) 1 x Per Day/30 Days Discharge Instructions: Cover with ABD pad Secured With: Medipore T - 70M Medipore H Soft Cloth Surgical T ape ape, 2x2 (in/yd) 1 x Per Day/30 Days Electronic Signature(s) Unsigned Entered ByYevonne Pax on 12/03/2022  11:26:16 -------------------------------------------------------------------------------- Problem List Details Patient Name: Date of Service: [name] [facility] 12/03/2022 8:45 A M Medical Record Number: [id number] Patient Account Number: [id number] Date of Birth/Sex: Treating RN: Oct 17, 1955 (66 y.o. [name]) [name] Primary Care Provider: Dione Booze Other Clinician: Referring Provider: Treating Provider/Extender: [name] in Treatment: 29 Active Problems ICD-10 Encounter Code Description Active Date MDM Diagnosis RAYVON, CURLEY ([id number]) [id number] Page 3 of 4 L89.613 Pressure ulcer of right heel, stage 3 11/22/2021 No Yes L89.623 Pressure ulcer of left heel, stage 3 11/22/2021 No Yes L89.154 Pressure ulcer of sacral region, stage 4 11/22/2021 No Yes L24.A0 Irritant contact dermatitis due to friction or contact with body fluids, 11/22/2021 No Yes unspecified L98.412 Non-pressure chronic ulcer of buttock with fat layer exposed 11/22/2021 No Yes M62.81 Muscle weakness (generalized) 11/22/2021 No Yes F31.9 Bipolar disorder, unspecified 11/22/2021 No Yes E11.622 Type 2 diabetes mellitus with other skin ulcer 11/22/2021 No Yes I10 Essential (primary) hypertension 11/22/2021 No Yes Z79.01 Long term (current) use of anticoagulants 11/22/2021 No Yes 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 11/22/2021 No Yes Inactive Problems Resolved Problems Electronic Signature(s) Signed: 12/03/2022 8:54:26 AM By: Allen Derry PA-C Entered By: Allen Derry on 12/03/2022 08:54:26 -------------------------------------------------------------------------------- SuperBill Details Patient Name: Date of Service: [name] [facility] 12/03/2022 Medical Record Number: [id number] Patient Account Number: [id number] Date of Birth/Sex: Treating RN: Dec 22, 1955 (66 y.o. [name]) [name] Primary Care Provider: Dione Booze Other  Clinician: Referring Provider: Treating Provider/Extender: [name] in Treatment: 53 Diagnosis Coding ICD-10 Codes Code Description [phone] Pressure ulcer of right heel, stage 3 L89.623 Pressure ulcer of left heel, stage 3 L89.154 Pressure ulcer of sacral region, stage 4 [place], [name] ([id number]) [id number] Page 4 of 4 L24.A0 Irritant contact dermatitis due to friction or contact with body fluids, unspecified L98.412 Non-pressure chronic ulcer of buttock with fat layer exposed M62.81 Muscle weakness (generalized) F31.9 Bipolar disorder, unspecified E11.622 Type 2 diabetes mellitus with other skin ulcer I10 Essential (primary) hypertension Z79.01 Long term (current) use  of anticoagulants 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 Facility Procedures : CPT4 Code: [id number] Description: 99213 - WOUND CARE VISIT-LEV 3 EST PT Modifier: Quantity: 1 Electronic Signature(s) Signed: 12/03/2022 11:40:58 AM By: [name] RN Entered By: [name] on 12/03/2022 11:40:57

## 2022-12-04 ENCOUNTER — Ambulatory Visit (INDEPENDENT_AMBULATORY_CARE_PROVIDER_SITE_OTHER): Payer: Medicare Other | Admitting: Psychiatry

## 2022-12-04 ENCOUNTER — Telehealth (HOSPITAL_COMMUNITY): Payer: Self-pay | Admitting: Psychiatry

## 2022-12-04 ENCOUNTER — Encounter: Payer: Self-pay | Admitting: Psychiatry

## 2022-12-04 VITALS — BP 122/82 | HR 94 | Temp 98.3°F

## 2022-12-04 DIAGNOSIS — F411 Generalized anxiety disorder: Secondary | ICD-10-CM | POA: Diagnosis not present

## 2022-12-04 DIAGNOSIS — F3178 Bipolar disorder, in full remission, most recent episode mixed: Secondary | ICD-10-CM | POA: Diagnosis not present

## 2022-12-04 DIAGNOSIS — G4701 Insomnia due to medical condition: Secondary | ICD-10-CM

## 2022-12-04 DIAGNOSIS — F0390 Unspecified dementia without behavioral disturbance: Secondary | ICD-10-CM | POA: Diagnosis not present

## 2022-12-04 MED ORDER — OXCARBAZEPINE 300 MG PO TABS: 300.0000 mg | ORAL_TABLET | Freq: Two times a day (BID) | ORAL | 1 refills | Status: DC

## 2022-12-04 MED ORDER — QUETIAPINE FUMARATE 25 MG PO TABS
25.0000 mg | ORAL_TABLET | Freq: Every day | ORAL | 1 refills | Status: DC | PRN
Start: 2022-12-04 — End: 2023-02-23

## 2022-12-04 NOTE — Telephone Encounter (Signed)
D:  Dr. Elna Breslow referred pt to virtual MH-IOP.  A:  Placed call to pt; his wife answered his cell phone (pt was in the background).  Oriented pt.  Wife asked about the price.  States he is looking for something to do so he won't be bored, but not such a pricey group.  Case manager encouraged them to call his insurance company to verify his benefits. Also, mentioned contacting [facility] or [facility] in [place] to see if they know of any support groups/programs.  Also recommended [facility] [phone].  Wife states they will f/u with those places instead.  Inform Dr. Elna Breslow.

## 2022-12-04 NOTE — Patient Instructions (Signed)
  www.[email or web address].org  www.psychologytoday  [facility] [email or web address] [address], [place], [place] [address]   [phone]  [facility] [email or web address] [address], [place], [place] [address]   [phone]   [facility] - [phone]  [facility] - [phone]  [facility] - [phone]   [facility] - [phone]  [facility] - [phone]   [email or web address]_[email or web address]_today this clinician can offer telehealth and has a sliding scale option  [email or web address]/ this group also offers sliding scale rates and is based out of [place]  [facility] has interns who offer sliding scale rates and some of the full time clinicians do, as well. You complete their contact form on their website and the referrals coordinator will help to get connected to someone   

## 2022-12-04 NOTE — Progress Notes (Unsigned)
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  Collateral information obtained from wife as well as [name].  According to family patient has been having worsening anxiety when left alone.  This only happens when wife is at home.  Patient when left alone with anyone else seems to be able to manage his behaviors better.  When his wife is home he tends to cling to her a lot, needs a lot of attention , constantly calls her name.  This has been extremely stressful for wife, currently likely struggling with caregiver burnout.  As per wife she has been giving him Seroquel as needed which seems to calm him down.  He is not in psychotherapy, does not believe it helped in the past.  Otherwise reports sleep at night is okay.  Denies any significant depression symptoms.  Reports appetite is fair.  Patient denies any suicidality, homicidality or perceptual disturbances.  Patient today appeared to be alert, oriented to person place time and situation.  3 word memory immediate 3 out of 3, after 5 minutes 2 out of 3.  Patient was able to do serial threes to some extent.  Attention and concentration seem to be limited.  Patient did not express any side effects to medications.  Reports he is compliant.  Both patient as well as family agreeable to referral to IOP.  They were referred to pace program in the past, however not interested in that since they  have to change their providers.    Visit Diagnosis:    ICD-10-CM   1. Bipolar disorder, in full remission, most recent episode mixed (HCC)  F31.78 QUEtiapine (SEROQUEL) 25 MG tablet    Oxcarbazepine (TRILEPTAL) 300 MG tablet    2. GAD (generalized anxiety disorder)  F41.1 QUEtiapine (SEROQUEL) 25 MG tablet    3. Insomnia due to medical condition  G47.01 QUEtiapine (SEROQUEL) 25 MG tablet   mood    4. 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  Past Medical History:  Past Medical History:  Diagnosis Date   Acute exacerbation of CHF (congestive heart failure) (HCC)    Anemia    Bipolar disorder (HCC)    Bladder mass    Cellulitis    Chronic anticoagulation    Chronic indwelling Foley catheter    CKD (chronic kidney disease) stage 3, GFR 30-59 ml/min (HCC)    Colostomy in place [facility])    Diabetes mellitus without complication (HCC)    Frontotemporal dementia (HCC)    Heel ulcer (HCC)    Left   Hematuria    History of blood clots    Hydronephrosis of right kidney    Hyperkalemia    Hypertension    Hyponatremia    Lithium toxicity    Metabolic acidosis    Neurogenic bladder    Obesity    Sacral decubitus ulcer  Sepsis (HCC)    TBI (traumatic brain injury) (HCC)    UTI (urinary tract infection)    VRE (vancomycin resistant enterococcus) culture positive     Past Surgical History:  Procedure Laterality Date   BACK SURGERY     CARPAL TUNNEL RELEASE Bilateral    COLON SURGERY     COLOSTOMY     CYSTOSCOPY W/ RETROGRADES Right 10/25/2022   Procedure: CYSTOSCOPY WITH RETROGRADE PYELOGRAM;  Surgeon: Sondra Come, MD;  Location: [facility] ORS;  Service: Urology;  Laterality: Right;   IR PERC TUN PERIT CATH WO PORT S&I /IMAG  10/11/2021   IR REMOVAL TUN CV CATH W/O FL  11/19/2021   IR US GUIDE VASC ACCESS RIGHT   10/11/2021   SACRAL DECUBITUS ULCER EXCISION     TONSILLECTOMY      Family Psychiatric History: I have reviewed family psychiatric history from progress note on 09/09/2017.  Family History:  Family History  Problem Relation Age of Onset   Depression Father    Deep vein thrombosis Father     Social History: Reviewed social history from progress note on 09/09/2017. Social History   Socioeconomic History   Marital status: Married    Spouse name: [name]   Number of children: 0   Years of education: Not on file   Highest education level: Associate degree: occupational, Scientist, product/process development, or vocational program  Occupational History   Not on file  Tobacco Use   Smoking status: Never    Passive exposure: Never   Smokeless tobacco: Never  Vaping Use   Vaping Use: Never used  Substance and Sexual Activity   Alcohol use: Not Currently   Drug use: No   Sexual activity: Not on file  Other Topics Concern   Not on file  Social History Narrative   Not on file   Social Determinants of Health   Financial Resource Strain: Low Risk  (09/09/2017)   Overall Financial Resource Strain (CARDIA)    Difficulty of Paying Living Expenses: Not hard at all  Food Insecurity: No Food Insecurity (09/19/2022)   Hunger Vital Sign    Worried About Running Out of Food in the Last Year: Never true    Ran Out of Food in the Last Year: Never true  Transportation Needs: No Transportation Needs (09/19/2022)   PRAPARE - Administrator, Civil Service (Medical): No    Lack of Transportation (Non-Medical): No  Physical Activity: Inactive (09/09/2017)   Exercise Vital Sign    Days of Exercise per Week: 0 days    Minutes of Exercise per Session: 0 min  Stress: No Stress Concern Present (09/09/2017)   [employer] of Occupational Health - Occupational Stress Questionnaire    Feeling of Stress : Not at all  Social Connections: Unknown (09/09/2017)   Social Connection and Isolation Panel [NHANES]     Frequency of Communication with Friends and Family: Not on file    Frequency of Social Gatherings with Friends and Family: Not on file    Attends Religious Services: More than 4 times per year    Active Member of Clubs or Organizations: No    Attends Banker Meetings: Never    Marital Status: Married    Allergies: No Known Allergies  Metabolic Disorder Labs: Lab Results  Component Value Date   HGBA1C 7.0 (H) 09/19/2022   MPG 154 09/19/2022   MPG 148.46 01/05/2022   No results found for: "PROLACTIN" Lab Results  Component Value Date   CHOL  141 04/14/2021   TRIG 45 04/14/2021   HDL 44 04/14/2021   CHOLHDL 3.2 04/14/2021   VLDL 9 04/14/2021   LDLCALC 88 04/14/2021   Lab Results  Component Value Date   TSH 2.462 07/29/2021   TSH 2.175 04/14/2021    Therapeutic Level Labs: Lab Results  Component Value Date   LITHIUM 1.24 (H) 01/04/2016   LITHIUM 1.51 (HH) 01/03/2016   No results found for: "VALPROATE" No results found for: "CBMZ"  Current Medications: Current Outpatient Medications  Medication Sig Dispense Refill   cholecalciferol (VITAMIN D) 25 MCG tablet Take 1 tablet (1,000 Units total) by mouth daily. 30 tablet 0   Continuous Blood Gluc Receiver (DEXCOM G7 RECEIVER) DEVI UAD for blood sugar monitoring.     ELIQUIS 5 MG TABS tablet Take 5 mg by mouth 2 (two) times daily.      fish oil-omega-3 fatty acids 1000 MG capsule Take 1 g by mouth 3 (three) times a week.     furosemide (LASIX) 40 MG tablet Take 1 tablet (40 mg total) by mouth daily. (Patient taking differently: Take 40 mg by mouth every morning.) 30 tablet 0   memantine (NAMENDA) 10 MG tablet Take 10 mg by mouth 2 (two) times daily.     QUEtiapine (SEROQUEL) 25 MG tablet Take 1-2 tablets (25-50 mg total) by mouth daily as needed. 60 tablet 1   rosuvastatin (CRESTOR) 20 MG tablet Take 20 mg by mouth at bedtime.     sodium bicarbonate 650 MG tablet Take 1 tablet (650 mg total) by mouth 2 (two)  times daily. 60 tablet 0   vitamin B-12 (CYANOCOBALAMIN) 1000 MCG tablet Take 3,000 mcg by mouth daily.     cephALEXin (KEFLEX) 500 MG capsule Take 1 capsule (500 mg total) by mouth 3 (three) times daily. (Patient not taking: Reported on 12/04/2022) 30 capsule 0   Oxcarbazepine (TRILEPTAL) 300 MG tablet Take 1 tablet (300 mg total) by mouth 2 (two) times daily. 180 tablet 1   No current facility-administered medications for this visit.     Musculoskeletal: Strength & Muscle Tone: within normal limits Gait & Station:  seated in wheelchair Patient leans: N/A  Psychiatric Specialty Exam: Review of Systems  Unable to perform ROS: Psychiatric disorder    Blood pressure 122/82, pulse 94, temperature 98.3 F (36.8 C), temperature source Temporal, SpO2 97 %.There is no height or weight on file to calculate BMI.  General Appearance: Casual  Eye Contact:  Fair  Speech:  Clear and Coherent  Volume:  Decreased  Mood:  Anxious  Affect:  Appropriate  Thought Process:  Goal Directed and Descriptions of Associations: Intact  Orientation:  Other:  Self, situation  Thought Content: Logical   Suicidal Thoughts:  No  Homicidal Thoughts:  No  Memory:  Immediate;   Fair Recent;   Fair Remote;   Poor  Judgement:  Other:  Limited  Insight:  Shallow  Psychomotor Activity:  Normal  Concentration:  Concentration: Fair and Attention Span: Fair  Recall:  [employer] of Knowledge: Fair  Language: Fair  Akathisia:  No  Handed:  Right  AIMS (if indicated): done  Assets:  Desire for Improvement Housing Social Support Transportation  ADL's:  Intact  Cognition: WNL  Sleep:  Fair   Screenings: Midwife Visit from 12/04/2022 in [facility] Office Visit from 02/27/2022 in [facility] Office Visit from 03/10/2018 in [facility]  AIMS Total Score 0 0 0      GAD-7     Flowsheet Row Office Visit from 12/04/2022 in [facility] Office Visit from 02/27/2022 in [facility]  Total GAD-7 Score 11 2      PHQ2-9    Flowsheet Row Office Visit from 12/04/2022 in [facility] Office Visit from 06/04/2022 in [facility] Office Visit from 02/27/2022 in [facility] Video Visit from 11/08/2021 in [facility] Office Visit from 08/30/2021 in [facility]  PHQ-2 Total Score 2 1 1  0 3  PHQ-9 Total Score 7 1 -- -- 6      Flowsheet Row Office Visit from 12/04/2022 in [facility] ED from 11/26/2022 in [facility] ED from 11/07/2022 in [facility] Department at [facility]  C-SSRS RISK CATEGORY No Risk No Risk No Risk        Assessment and Plan: [name] is a 67 year old African-American male with history of bipolar disorder type II, diabetes mellitus, chronic kidney disease stage IIIb, congestive heart failure, multiple other medical problems, presented for medication management.  Patient currently struggling with anxiety, behavioral problems will benefit from the following plan.  Plan Bipolar disorder type II in remission Trileptal 300 mg p.o. twice daily Seroquel as prescribed, as needed for agitation only.  Insomnia-stable Continue Seroquel as needed.   GAD-unstable Discussed behavioral strategies. Continue Seroquel, changed to 25-50 mg as needed daily, advised to limit use.  Provided education about long-term risk of being on this medication. Discussed referral for CBT again however family declines. Discussed referral for IOP-I have sent a referral to Ms. [name], [name]. Discussed  setting up a schedule for patient and following it, setting boundaries when wife is at home.  Wife was provided resources for psychotherapy for herself for caregiver burnout.   Major neurocognitive disorder chronic-patient to continue follow-up with neurologist.  Continue memantine as prescribed.  I have reviewed and discussed labs-11/26/2022-CBC with differential-platelet count-188-within normal limits, RBC low, hemoglobin low, hematocrit low, MCH low. CMP-sodium 137, AST/ALT-within normal limits. Creatinine 2.36-patient with history of chronic kidney disease.  Collateral information obtained from spouse/family as noted above.   Collaboration of Care: Collaboration of Care: Referral or follow-up with counselor/therapist AEB encouraged to establish care with therapist, referred for IOP.  Patient/Guardian was advised Release of Information must be obtained prior to any record release in order to collaborate their care with an outside provider. Patient/Guardian was advised if they have not already done so to contact the registration department to sign all necessary forms in order for [place] to release information regarding their care.   Consent: Patient/Guardian gives verbal consent for treatment and assignment of benefits for services provided during this visit. Patient/Guardian expressed understanding and agreed to proceed.   This note was generated in part or whole with voice recognition software. Voice recognition is usually quite accurate but there are transcription errors that can and very often do occur. I apologize for any typographical errors that were not detected and corrected.    [name], MD 12/05/2022, 8:28 AM

## 2022-12-24 ENCOUNTER — Encounter: Payer: Medicare Other | Admitting: Physician Assistant

## 2022-12-24 DIAGNOSIS — L89613 Pressure ulcer of right heel, stage 3: Secondary | ICD-10-CM | POA: Diagnosis not present

## 2022-12-24 NOTE — Progress Notes (Signed)
REDMOND, WHITTLEY ([id number]) [id number] Page 1 of 2 Visit Report for 12/24/2022 Chief Complaint Document Details Patient Name: Date of Service: [name] [facility] 12/24/2022 8:45 A M Medical Record Number: [id number] Patient Account Number: [id number] Date of Birth/Sex: Treating RN: 1955/08/04 (66 y.o. [name]) [name] Primary Care Provider: Dione Booze Other Clinician: Referring Provider: Treating Provider/Extender: [name] in Treatment: 56 Information Obtained from: Patient Chief Complaint Sacral, right gluteal, and bilateral heel ulcers Electronic Signature(s) Signed: 12/24/2022 9:16:53 AM By: Allen Derry PA-C Entered By: Allen Derry on 12/24/2022 09:16:53 -------------------------------------------------------------------------------- Problem List Details Patient Name: Date of Service: [name] [facility] 12/24/2022 8:45 A M Medical Record Number: [id number] Patient Account Number: [id number] Date of Birth/Sex: Treating RN: 05/15/1956 (66 y.o. [name] Primary Care Provider: Dione Booze Other Clinician: Referring Provider: Treating Provider/Extender: [name] in Treatment: 65 Active Problems ICD-10 Encounter Code Description Active Date MDM Diagnosis L89.613 Pressure ulcer of right heel, stage 3 11/22/2021 No Yes L89.623 Pressure ulcer of left heel, stage 3 11/22/2021 No Yes L89.154 Pressure ulcer of sacral region, stage 4 11/22/2021 No Yes [name], [name] ([id number]) [id number] Page 2 of 2 L24.A0 Irritant contact dermatitis due to friction or contact with body 11/22/2021 No Yes fluids, unspecified L98.412 Non-pressure chronic ulcer of buttock with fat layer exposed 11/22/2021 No Yes M62.81 Muscle weakness (generalized) 11/22/2021 No Yes F31.9 Bipolar disorder, unspecified 11/22/2021 No Yes E11.622 Type 2 diabetes mellitus with  other skin ulcer 11/22/2021 No Yes I10 Essential (primary) hypertension 11/22/2021 No Yes Z79.01 Long term (current) use of anticoagulants 11/22/2021 No Yes 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

## 2022-12-25 NOTE — Progress Notes (Signed)
MAXON, KRESSE ([id number]) [id number] Page 1 of 10 Visit Report for 12/24/2022 Arrival Information Details Patient Name: Date of Service: [name] [facility] 12/24/2022 8:45 A M Medical Record Number: [id number] Patient Account Number: [id number] Date of Birth/Sex: Treating [name]: Feb 24, 1956 (66 y.o. [name]) [name] Primary Care [name]: [name] Other Clinician: Referring [name]: Treating [name]/Extender: [name] in Treatment: 56 Visit Information History Since Last Visit Added or deleted any medications: No Patient Arrived: Wheel Chair Any new allergies or adverse reactions: No Arrival Time: 08:59 Had a fall or experienced change in No Accompanied By: wife activities of daily living that may affect Transfer Assistance: Manual risk of falls: Patient Identification Verified: Yes Signs or symptoms of abuse/neglect since last visito No Secondary Verification Process Completed: Yes Hospitalized since last visit: No Patient Requires Transmission-Based Precautions: No Implantable device outside of the clinic excluding No Patient Has Alerts: Yes cellular tissue based products placed in the center Patient Alerts: Patient on Blood Thinner since last visit: Has Dressing in Place as Prescribed: Yes Pain Present Now: No Electronic Signature(s) Signed: 12/25/2022 11:52:51 AM By: [name] Entered By: [name] on 12/24/2022 09:00:04 -------------------------------------------------------------------------------- Clinic Level of Care Assessment Details Patient Name: Date of Service: [name] [facility] 12/24/2022 8:45 A M Medical Record Number: [id number] Patient Account Number: [id number] Date of Birth/Sex: Treating [name]: [birth year] (66 y.o. [name]) [name] Primary Care [name]: [name] Other Clinician: Referring [name]: Treating [name]/Extender: [name] in  Treatment: 56 Clinic Level of Care Assessment Items TOOL 4 Quantity Score X- 1 0 Use when only an EandM is performed on FOLLOW-UP visit ASSESSMENTS - Nursing Assessment / Reassessment X- 1 10 Reassessment of Co-morbidities (includes updates in patient status) X- 1 5 Reassessment of Adherence to Treatment Plan [name] ([id number]) [id number] Page 2 of 10 ASSESSMENTS - Wound and Skin A ssessment / Reassessment []  - Simple Wound Assessment / Reassessment - one wound 0 X- 2 5 Complex Wound Assessment / Reassessment - multiple wounds []  - 0 Dermatologic / Skin Assessment (not related to wound area) ASSESSMENTS - Focused Assessment []  - 0 Circumferential Edema Measurements - multi extremities []  - 0 Nutritional Assessment / Counseling / Intervention []  - 0 Lower Extremity Assessment (monofilament, tuning fork, pulses) []  - 0 Peripheral Arterial Disease Assessment (using hand held doppler) ASSESSMENTS - Ostomy and/or Continence Assessment and Care []  - 0 Incontinence Assessment and Management []  - 0 Ostomy Care Assessment and Management (repouching, etc.) PROCESS - Coordination of Care X - Simple Patient / Family Education for ongoing care 1 15 []  - 0 Complex (extensive) Patient / Family Education for ongoing care []  - 0 Staff obtains Chiropractor, Records, T Results / Process Orders est []  - 0 Staff telephones HHA, Nursing Homes / Clarify orders / etc []  - 0 Routine Transfer to another Facility (non-emergent condition) []  - 0 Routine Hospital Admission (non-emergent condition) []  - 0 New Admissions / Manufacturing engineer / Ordering NPWT Apligraf, etc. , []  - 0 Emergency Hospital Admission (emergent condition) X- 1 10 Simple Discharge Coordination []  - 0 Complex (extensive) Discharge Coordination PROCESS - Special Needs []  - 0 Pediatric / Minor Patient Management []  - 0 Isolation Patient Management []  - 0 Hearing / Language  / Visual special needs []  - 0 Assessment of Community assistance (transportation, D/C planning, etc.) []  - 0 Additional assistance / Altered mentation []  - 0 Support Surface(s) Assessment (bed, cushion, seat, etc.) INTERVENTIONS - Wound Cleansing / Measurement []  -  0 Simple Wound Cleansing - one wound X- 2 5 Complex Wound Cleansing - multiple wounds X- 1 5 Wound Imaging (photographs - any number of wounds) []  - 0 Wound Tracing (instead of photographs) []  - 0 Simple Wound Measurement - one wound X- 2 5 Complex Wound Measurement - multiple wounds INTERVENTIONS - Wound Dressings X - Small Wound Dressing one or multiple wounds 2 10 []  - 0 Medium Wound Dressing one or multiple wounds []  - 0 Large Wound Dressing one or multiple wounds []  - 0 Application of Medications - topical []  - 0 Application of Medications - injection INTERVENTIONS - Miscellaneous []  - 0 External ear exam [name], [name] ([id number]) [id number] Page 3 of 10 []  - 0 Specimen Collection (cultures, biopsies, blood, body fluids, etc.) []  - 0 Specimen(s) / Culture(s) sent or taken to Lab for analysis []  - 0 Patient Transfer (multiple staff / Michiel Sites Lift / Similar devices) []  - 0 Simple Staple / Suture removal (25 or less) []  - 0 Complex Staple / Suture removal (26 or more) []  - 0 Hypo / Hyperglycemic Management (close monitor of Blood Glucose) []  - 0 Ankle / Brachial Index (ABI) - do not check if billed separately X- 1 5 Vital Signs Has the patient been seen at the hospital within the last three years: Yes Total Score: 100 Level Of Care: New/Established - Level 3 Electronic Signature(s) Signed: 12/25/2022 11:52:51 AM By: [name] Entered By: [name] on 12/24/2022 11:35:25 -------------------------------------------------------------------------------- Encounter Discharge Information Details Patient Name: Date of Service: [name] [facility] 12/24/2022  8:45 A M Medical Record Number: [id number] Patient Account Number: [id number] Date of Birth/Sex: Treating [name]: [birth year] (66 y.o. [name] Primary Care Baani Bober: [name] Other Clinician: Referring Sinthia Karabin: Treating Micheala Morissette/Extender: [name] in Treatment: 56 Encounter Discharge Information Items Discharge Condition: Stable Ambulatory Status: Ambulatory Discharge Destination: Home Transportation: Private Auto Accompanied By: wife Schedule Follow-up Appointment: Yes Clinical Summary of Care: Electronic Signature(s) Signed: 12/24/2022 11:36:14 AM By: [name] Entered By: [name] on 12/24/2022 11:36:14 -------------------------------------------------------------------------------- Lower Extremity Assessment Details Patient Name: Date of Service: [name] [facility] 12/24/2022 8:45 A [address], [name] ([id number]) [id number] Page 4 of 10 Medical Record Number: [id number] Patient Account Number: [id number] Date of Birth/Sex: Treating [name]: 05/15/1956 (66 y.o. [name]) [name] Primary Care Breckin Zafar: [name] Other Clinician: Referring Lowen Mansouri: Treating Denai Caba/Extender: [name] in Treatment: 56 Electronic Signature(s) Signed: 12/25/2022 11:52:51 AM By: [name] Entered By: [name] on 12/24/2022 09:12:49 -------------------------------------------------------------------------------- Multi Wound Chart Details Patient Name: Date of Service: [name] [facility] 12/24/2022 8:45 A M Medical Record Number: [id number] Patient Account Number: [id number] Date of Birth/Sex: Treating [name]: [birth year] (66 y.o. [name]) [name] Primary Care Iyonnah Ferrante: [name] Other Clinician: Referring Jacson Rapaport: Treating Esmay Amspacher/Extender: [name] in Treatment: 56 Vital Signs Height(in): 66 Pulse(bpm): 93 Weight(lbs): 279 Blood  Pressure(mmHg): 163/71 Body Mass Index(BMI): 45 Temperature(F): 97.7 Respiratory Rate(breaths/min): 18 [5:Photos:] [N/A:N/A] Right Calcaneus Sacrum N/A Wound Location: Pressure Injury Pressure Injury N/A Wounding Event: Pressure Ulcer Pressure Ulcer N/A Primary Etiology: Anemia, Sleep Apnea, Coronary Artery Anemia, Sleep Apnea, Coronary Artery N/A Comorbid History: Disease, Deep Vein Thrombosis, Disease, Deep Vein Thrombosis, Hypertension, Type II Diabetes, Hypertension, Type II Diabetes, History of pressure wounds, History of pressure wounds, Neuropathy Neuropathy 07/01/2021 07/01/2021 N/A Date Acquired: 12 56 N/A Weeks of Treatment: Open Open N/A Wound Status: No No N/A Wound Recurrence: 0.6x0.4x0.1 3x2.3x4.2 N/A Measurements L x  W x D (cm) 0.188 5.419 N/A A (cm) : rea 0.019 22.761 N/A Volume (cm) : 95.50% -72.50% N/A % Reduction in A rea: 95.40% -141.50% N/A % Reduction in Volume: Category/Stage III Category/Stage IV N/A Classification: None Present Medium N/A Exudate A mount: N/A Serosanguineous N/A Exudate Type: N/A red, brown N/A Exudate Color: Medium (34-66%) Large (67-100%) N/A Granulation A mount: Red Red N/A Granulation Quality: Medium (34-66%) None Present (0%) N/A Necrotic A mount: Fat Layer (Subcutaneous Tissue): Yes Fat Layer (Subcutaneous Tissue): Yes N/A Exposed Structures: Fascia: No Fascia: No Calame, [name] ([id number]) [id number] Page 5 of 10 Tendon: No Tendon: No Muscle: No Muscle: No Joint: No Joint: No Bone: No Bone: No Large (67-100%) None N/A Epithelialization: Treatment Notes Electronic Signature(s) Signed: 12/25/2022 11:52:51 AM By: [name] Entered By: [name] on 12/24/2022 09:12:54 -------------------------------------------------------------------------------- Multi-Disciplinary Care Plan Details Patient Name: Date of Service: [name] [facility] 12/24/2022 8:45 A  M Medical Record Number: [id number] Patient Account Number: [id number] Date of Birth/Sex: Treating [name]: [birth year] (66 y.o. [name]) [name] Primary Care Dhriti Fales: [name] Other Clinician: Referring Sparsh Callens: Treating Tandrea Kommer/Extender: [name] in Treatment: 56 Active Inactive Wound/Skin Impairment Nursing Diagnoses: Knowledge deficit related to ulceration/compromised skin integrity Goals: Patient/caregiver will verbalize understanding of skin care regimen Date Initiated: 11/22/2021 Target Resolution Date: 01/23/2023 Goal Status: Active Ulcer/skin breakdown will have a volume reduction of 30% by week 4 Date Initiated: 11/22/2021 Date Inactivated: 01/10/2022 Target Resolution Date: 12/23/2021 Goal Status: Unmet Unmet Reason: comorbities Ulcer/skin breakdown will have a volume reduction of 50% by week 8 Date Initiated: 11/22/2021 Date Inactivated: 05/07/2022 Target Resolution Date: 01/22/2022 Goal Status: Unmet Unmet Reason: comorbidities Ulcer/skin breakdown will have a volume reduction of 80% by week 12 Date Initiated: 11/22/2021 Date Inactivated: 05/07/2022 Target Resolution Date: 02/22/2022 Goal Status: Unmet Unmet Reason: comorbidities Ulcer/skin breakdown will heal within 14 weeks Date Initiated: 11/22/2021 Date Inactivated: 05/07/2022 Target Resolution Date: 03/25/2022 Goal Status: Unmet Unmet Reason: comorbidities Interventions: Assess patient/caregiver ability to obtain necessary supplies Assess patient/caregiver ability to perform ulcer/skin care regimen upon admission and as needed Assess ulceration(s) every visit Notes: Electronic Signature(s) Signed: 12/25/2022 11:52:51 AM By: [name] Entered By: [name] on 12/24/2022 09:13:16 Rauber, [name] ([id number]) [id number] Page 6 of 10 -------------------------------------------------------------------------------- Pain Assessment Details Patient  Name: Date of Service: [name] [facility] 12/24/2022 8:45 A M Medical Record Number: [id number] Patient Account Number: [id number] Date of Birth/Sex: Treating [name]: 1956-02-10 (66 y.o. [name]) [name] Primary Care [place] Millon: [name] Other Clinician: Referring Nona Gracey: Treating Poppy Mcafee/Extender: [name] in Treatment: 56 Active Problems Location of Pain Severity and Description of Pain Patient Has Paino No Site Locations Pain Management and Medication Current Pain Management: Electronic Signature(s) Signed: 12/25/2022 11:52:51 AM By: [name] Entered By: [name] on 12/24/2022 09:04:32 -------------------------------------------------------------------------------- Patient/Caregiver Education Details Patient Name: Date of Service: [name] [facility] 6/25/2024andnbsp8:45 A M Medical Record Number: [id number] Patient Account Number: [id number] Date of Birth/Gender: Treating [name]: May 30, 1956 (66 y.o. [name] Primary Care Physician: [name] Other Clinician: Referring Physician: Treating Physician/Extender: [name] in Treatment: [address] [place] ([id number]) [phone].pdf Page 7 of 10 Education Assessment Education Provided To: Patient Education Topics Provided Wound/Skin Impairment: Handouts: Caring for Your Ulcer Methods: Explain/Verbal Responses: State content correctly Electronic Signature(s) Signed: 12/25/2022 11:52:51 AM By: [name] Entered By: [name] on 12/24/2022 09:13:32 -------------------------------------------------------------------------------- Wound Assessment Details Patient Name: Date of Service: [name] [facility]  12/24/2022 8:45 A M Medical Record Number: [id number] Patient Account Number: [id number] Date of Birth/Sex: Treating [name]: 07/23/1955 (66 y.o. [name]) [name] Primary Care Odean Fester: [name] Other  Clinician: Referring Taralynn Quiett: Treating Mashayla Lavin/Extender: [name] in Treatment: 56 Wound Status Wound Number: 5 Primary Pressure Ulcer Etiology: Wound Location: Right Calcaneus Wound Open Wounding Event: Pressure Injury Status: Date Acquired: 07/01/2021 Comorbid Anemia, Sleep Apnea, Coronary Artery Disease, Deep Vein Weeks Of Treatment: 56 History: Thrombosis, Hypertension, Type II Diabetes, History of pressure Clustered Wound: No wounds, Neuropathy Photos Wound Measurements Length: (cm) 0.6 Width: (cm) 0.4 Depth: (cm) 0.1 Area: (cm) 0.188 Volume: (cm) 0.019 % Reduction in Area: 95.5% % Reduction in Volume: 95.4% Epithelialization: Large (67-100%) Tunneling: No Undermining: No Wound Description Classification: Category/Stage III Exudate Amount: None Present Menard, Alper ([id number]) Foul Odor After Cleansing: No Slough/Fibrino No [id number] Page 8 of 10 Wound Bed Granulation Amount: Medium (34-66%) Exposed Structure Granulation Quality: Red Fascia Exposed: No Necrotic Amount: Medium (34-66%) Fat Layer (Subcutaneous Tissue) Exposed: Yes Necrotic Quality: Adherent Slough Tendon Exposed: No Muscle Exposed: No Joint Exposed: No Bone Exposed: No Treatment Notes Wound #5 (Calcaneus) Wound Laterality: Right Cleanser Normal Saline Discharge Instruction: Wash your hands with soap and water. Remove old dressing, discard into plastic bag and place into trash. Cleanse the wound with Normal Saline prior to applying a clean dressing using gauze sponges, not tissues or cotton balls. Do not scrub or use excessive force. Pat dry using gauze sponges, not tissue or cotton balls. Soap and Water Discharge Instruction: Gently cleanse wound with antibacterial soap, rinse and pat dry prior to dressing wounds Peri-Wound Care Topical Primary Dressing Hydrofera Blue Ready Transfer Foam, 2.5x2.5 (in/in) Discharge  Instruction: cut to size of wound Secondary Dressing Gauze Discharge Instruction: As directed: dry, moistened with saline or moistened with Dakins Solution Secured With Medipore T - 27M Medipore H Soft Cloth Surgical T ape ape, 2x2 (in/yd) Compression Wrap Compression Stockings Add-Ons Electronic Signature(s) Signed: 12/25/2022 11:52:51 AM By: [name] Entered By: [name] on 12/24/2022 09:12:10 -------------------------------------------------------------------------------- Wound Assessment Details Patient Name: Date of Service: [name] [facility] 12/24/2022 8:45 A M Medical Record Number: [id number] Patient Account Number: [id number] Date of Birth/Sex: Treating [name]: 1956-05-17 (66 y.o. [name]) [name] Primary Care Mekesha Solomon: [name] Other Clinician: Referring Avarose Mervine: Treating Natara Monfort/Extender: [name] in Treatment: 56 Wound Status Wound Number: 7 Primary Pressure Ulcer Etiology: Wound Location: Sacrum Wound Open Wounding Event: Pressure Injury Status: Date Acquired: 07/01/2021 Comorbid Anemia, Sleep Apnea, Coronary Artery Disease, Deep Vein Weeks Of Treatment: 56 History: Thrombosis, Hypertension, Type II Diabetes, History of pressure Clustered Wound: No Rohr, Haydin ([id number]) [id number] Page 9 of 10 Clustered Wound: No wounds, Neuropathy Photos Wound Measurements Length: (cm) 3 Width: (cm) 2.3 Depth: (cm) 4.2 Area: (cm) 5.419 Volume: (cm) 22.761 % Reduction in Area: -72.5% % Reduction in Volume: -141.5% Epithelialization: None Tunneling: No Undermining: No Wound Description Classification: Category/Stage IV Exudate Amount: Medium Exudate Type: Serosanguineous Exudate Color: red, brown Foul Odor After Cleansing: No Slough/Fibrino Yes Wound Bed Granulation Amount: Large (67-100%) Exposed Structure Granulation Quality: Red Fascia Exposed: No Necrotic Amount: None  Present (0%) Fat Layer (Subcutaneous Tissue) Exposed: Yes Tendon Exposed: No Muscle Exposed: No Joint Exposed: No Bone Exposed: No Treatment Notes Wound #7 (Sacrum) Cleanser Normal Saline Discharge Instruction: Wash your hands with soap and water. Remove old dressing, discard into plastic bag and place into trash. Cleanse the wound with Normal Saline prior to applying a  clean dressing using gauze sponges, not tissues or cotton balls. Do not scrub or use excessive force. Pat dry using gauze sponges, not tissue or cotton balls. Soap and Water Discharge Instruction: Gently cleanse wound with antibacterial soap, rinse and pat dry prior to dressing wounds Peri-Wound Care Topical calmoseptine Discharge Instruction: apply to excoriated areas / periwound Primary Dressing Hydrofera Blue Ready Transfer Foam, 4x5 (in/in) Discharge Instruction: Apply Hydrofera Blue Ready to wound bed as directed Secondary Dressing ABD Pad 5x9 (in/in) Discharge Instruction: Cover with ABD pad Secured With Medipore T - 33M Medipore H Soft Cloth Surgical T ape ape, 2x2 (in/yd) Compression Wrap Compression Stockings Add-Ons Electronic Signature(s) Signed: 12/25/2022 11:52:51 AM By: [name] Kohen, [name] ([id number]) [id number] Page 10 of 10 Signed: 12/25/2022 11:52:51 AM By: [name] Entered By: [name] on 12/24/2022 09:12:39 -------------------------------------------------------------------------------- Vitals Details Patient Name: Date of Service: [name] [facility] 12/24/2022 8:45 A M Medical Record Number: [id number] Patient Account Number: [id number] Date of Birth/Sex: Treating [name]: November 30, 1955 (66 y.o. [name]) [name] Primary Care Garl Speigner: [name] Other Clinician: Referring Liya Strollo: Treating Tavarus Poteete/Extender: [name] in Treatment: 56 Vital Signs Time Taken: 09:03 Temperature (F): 97.7 Height (in):  66 Pulse (bpm): 93 Weight (lbs): 279 Respiratory Rate (breaths/min): 18 Body Mass Index (BMI): 45 Blood Pressure (mmHg): 163/71 Reference Range: 80 - 120 mg / dl Electronic Signature(s) Signed: 12/25/2022 11:52:51 AM By: [name] Entered By: [name] on 12/24/2022 09:04:16

## 2023-01-14 ENCOUNTER — Encounter: Payer: Medicare Other | Attending: Physician Assistant | Admitting: Physician Assistant

## 2023-01-14 DIAGNOSIS — L89623 Pressure ulcer of left heel, stage 3: Secondary | ICD-10-CM | POA: Insufficient documentation

## 2023-01-14 DIAGNOSIS — L89154 Pressure ulcer of sacral region, stage 4: Secondary | ICD-10-CM | POA: Diagnosis not present

## 2023-01-14 DIAGNOSIS — Z86718 Personal history of other venous thrombosis and embolism: Secondary | ICD-10-CM | POA: Diagnosis not present

## 2023-01-14 DIAGNOSIS — I10 Essential (primary) hypertension: Secondary | ICD-10-CM | POA: Insufficient documentation

## 2023-01-14 DIAGNOSIS — Z7901 Long term (current) use of anticoagulants: Secondary | ICD-10-CM | POA: Insufficient documentation

## 2023-01-14 DIAGNOSIS — L89613 Pressure ulcer of right heel, stage 3: Secondary | ICD-10-CM | POA: Insufficient documentation

## 2023-01-14 DIAGNOSIS — E1140 Type 2 diabetes mellitus with diabetic neuropathy, unspecified: Secondary | ICD-10-CM | POA: Insufficient documentation

## 2023-01-14 DIAGNOSIS — L98412 Non-pressure chronic ulcer of buttock with fat layer exposed: Secondary | ICD-10-CM | POA: Diagnosis not present

## 2023-01-14 DIAGNOSIS — E11622 Type 2 diabetes mellitus with other skin ulcer: Secondary | ICD-10-CM | POA: Diagnosis not present

## 2023-01-14 NOTE — Progress Notes (Addendum)
[name], [name] ([id number]) [id number] Page 1 of 11 Visit Report for 01/14/2023 Chief Complaint Document Details Patient Name: Date of Service: [name] [facility] 01/14/2023 8:45 A M Medical Record Number: [id number] Patient Account Number: [id number] Date of Birth/Sex: Treating RN: 06/15/1956 (66 y.o. [name]) [name] Primary Care Provider: Dione Booze Other Clinician: Referring Provider: Treating Provider/Extender: [name] in Treatment: 59 Information Obtained from: Patient Chief Complaint Sacral, right gluteal, and bilateral heel ulcers Electronic Signature(s) Signed: 01/14/2023 9:18:11 AM By: Allen Derry PA-C Entered By: Allen Derry on 01/14/2023 09:18:11 -------------------------------------------------------------------------------- Debridement Details Patient Name: Date of Service: [name] [facility] 01/14/2023 8:45 A M Medical Record Number: [id number] Patient Account Number: [id number] Date of Birth/Sex: Treating RN: 09-Apr-1956 (66 y.o. [name]) [name] Primary Care Provider: Dione Booze Other Clinician: Referring Provider: Treating Provider/Extender: [name] in Treatment: 59 Debridement Performed for Assessment: Wound #5 Right Calcaneus Performed By: Physician Allen Derry, PA-C Debridement Type: Debridement Level of Consciousness (Pre-procedure): Awake and Alert Pre-procedure Verification/Time Out Yes - 09:17 Taken: Start Time: 09:17 Percent of Wound Bed Debrided: 100% T Area Debrided (cm): otal 5.18 Tissue and other material debrided: Viable, Non-Viable, Slough, Subcutaneous, Slough Level: Skin/Subcutaneous Tissue Debridement Description: Excisional Instrument: Curette Bleeding: Minimum Hemostasis Achieved: Pressure End Time: 09:23 Procedural Pain: 0 Post Procedural Pain: 0 Response to Treatment: Procedure was tolerated well [name]  ([id number]) [id number] Page 2 of 11 Level of Consciousness (Post- Awake and Alert procedure): Post Debridement Measurements of Total Wound Length: (cm) 2.2 Stage: Category/Stage III Width: (cm) 3 Depth: (cm) 0.3 Volume: (cm) 1.555 Character of Wound/Ulcer Post Debridement: Improved Post Procedure Diagnosis Same as Pre-procedure Electronic Signature(s) Signed: 01/14/2023 5:59:11 PM By: Allen Derry PA-C Signed: 01/16/2023 3:42:23 PM By: [name] RN Entered By: [name] on 01/14/2023 09:21:58 -------------------------------------------------------------------------------- HPI Details Patient Name: Date of Service: [name] [facility] 01/14/2023 8:45 A M Medical Record Number: [id number] Patient Account Number: [id number] Date of Birth/Sex: Treating RN: 1955/11/04 (66 y.o. [name] Primary Care Provider: Dione Booze Other Clinician: Referring Provider: Treating Provider/Extender: [name] in Treatment: 59 History of Present Illness HPI Description: 05/29/2021 this is a patient who presents today for initial inspection here in the clinic concerning wounds that he has over the right heel and the sacral region. Unfortunately the sacral wound is starting to spread off to the right gluteal location due to how he sits always leaning towards the right side in his chair. His wife is present she is the primary caregiver though she is not home with him all the time she does have to work. She does do an excellent job however it appears to be in trying to keep things under good control for him. The patient is not able to change positions himself nor walk by himself so he is pretty much at the mercy of the position he is putting when she is gone and this tends to be his chair which she sits and most of the day. Obviously this I think is the main culprit for what is going on currently. It was actually in January 2020 when  the sacral wound started. It was in September 2022 when the wound started to spread more to the right gluteal location. Subsequently in August 2022 is when he had been in a skilled nursing facility and the heel started to give him trouble as well. That does not seem to be doing nearly as poorly as the sacral region.  He was hospitalized in October 2022 secondary to sepsis and this was in regard to the foot and was sent to skilled nursing again he is now back at home. He did have a wound VAC for the sacral wound over the summer 2022 but being in and out of facilities this ended up getting sent back. The patient does have [facility] home health that comes out 1 time per week to help with care. His most recent hemoglobin A1c was 6.9 in August 2022. 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 s. 07/24/2021 upon evaluation today patient appears to be doing decently well in regard to the pressure ulcer on the right heel as well as the sacral region. In general I think you are making some progress here which is great news. Overall the heel unfortunately had already closed previously when we saw him although it apparently reopened when he was working with physical therapy according to his wife. The area in the sacral region is doing well and looks clean there is still some depth here but I still think it would be difficult to wound VAC this region. His wife does an awesome job taking care of him. Is been so long since we have seen him because he has been in the hospital to be honest. 08/07/2021 upon evaluation today patient appears to be doing well at this time. Fortunately I do not see any signs of active infection locally or systemically at this time which is great news. No fevers, chills, nausea, vomiting, or  diarrhea. Unfortunately after I saw him on the 24th he actually ended up in the hospital in the 27th due to being septic. This was not due to the wounds but after looking at records actually due to a UTI. Fortunately he is doing much better and very happy in that regard. I do not see any signs of infection locally nor systemically at this time. 08/21/2021 upon evaluation today patient appears to be doing well with regard to his wound. Fortunately I think that the sacral region is doing decently well at this point which is great news. With regard to the foot this also does have some slough noted but I feel like we are making progress here. He does have some irritation around the right upper thigh/gluteal region. I feel like it is more towards the thigh. Nonetheless this does appear to be pressure related he spends a lot of time sitting up his caregiver states he really will not get in the bed and stay there like he should. 09/04/2021 upon evaluation patient appears to be doing decently well today in regard to the wound on his heel as well as the sacral area. I am actually very pleased with both and how things are appearing currently. There does not appear to be any evidence of active infection I think that his caregiver is doing an awesome job with regard to the wound care here. She is present today as well and I did discuss this with her. [name], [name] ([id number]) [id number] Page 3 of 11 09/25/2021 upon evaluation today patient appears to be doing well with regard to his wound on the sacral region. His right heel is also doing well. Unfortunately he has a new area on his left heel which does appear to be pressure injury. I do not see any evidence of active  infection locally or systemically which is great news no fevers, chills, nausea, vomiting, or diarrhea. Readmission: 11-22-2021 upon evaluation today patient presents for reevaluation here in the clinic concerning  issues with his wounds. Since have last seen him he was in the hospital and then subsequently ended up in a skilled nursing facility. Subsequently period of time in the facility he actually had a breakdown in the right thigh location which has been an issue for him. Fortunately I do not see any evidence of active infection locally or systemically which is great news and I am very pleased in that regard. Nonetheless he does have still the wounds on both heels as well as the wound in the sacral area and the new area in the right upper thigh/gluteal region. 12-13-2021 upon evaluation today patient appears to be doing well currently in regard to his wounds. The left heel is going to require some sharp debridement. Fortunately the right heel seems to be doing quite well. Overall I think his gluteal region as well as sacral region also are doing well. 01-10-2022 upon evaluation today patient appears to be doing better in regard to some areas unfortunately his sacral area is not doing as well. It seems of gotten worse his caregiver states when he was in the hospital recently they really did not offloading. Fortunately I do not see any evidence of active infection locally or systemically at this time. 02-01-2022 upon evaluation today patient appears to be doing well currently in regard to his wounds. He is going require some sharp debridement of clearway some of the necrotic debris. Fortunately I do not see any evidence of active infection locally or systemically which is great news. No fevers, chills, nausea, vomiting, or diarrhea. 02-22-2022 upon evaluation today patient appears to be doing poorly in regard to his right heel the left heel is completely closed. Unfortunately I think he is got some deep tissue injury to this area I am going to do a culture at this spot. With that being said he is having 2 other significant issues in regard to the left thigh this is showing signs of cellulitis all the way down into  the thigh and was seeing definite temperature difference in the left thigh versus the right thigh when feeling. It also is of note that the patient is also having some issues here with trouble in regard to having fevers his wife has been giving him Tylenol and ibuprofen to help keep the fevers under control but he has been as high as 102 this is pretty much been going on since Monday or Tuesday of this week. Nonetheless I am concerned I am not exactly sure where the cellulitis on the left thigh is coming from not sure if this is emanating from his sacral region that is a possibility or if there is something completely different going on either way with the fevers I am more worried that he needs to go get this checked out as soon as possible. 03-08-2022 upon evaluation today patient appears to be doing well with regard to his heel as well as sacral region. Both are showing signs of significant improvement compared to the last time I saw him. Overall I think we are in a much better spot 04-04-2022 upon evaluation today patient appears to be doing about the same in regard to his wounds. I do not see any signs of improvement nor worsening at this point. Fortunately there is no evidence of active infection at this time. He is  not staying off of this is much as he needs to in fact is not even sleeping in the hospital bed with the alternating air mattress. 04-16-2022 upon evaluation today patient appears to be doing well currently in regard to his wound. He has been tolerating the dressing changes without complication. Fortunately I do not see any evidence of infection. The heel actually looks quite well the sacral area actually is still quite deep. From my questioning and discussion with him today I do not believe that he is really staying off of this as much as he should. He seemed a little uncomfortable during that portion of the conversation today. I think that is contributing quite a bit to his lack of  progress with regard to healing. 11/7; patient with decubitus ulcers on his right heel as well as his lower sacrum. We have been using Hydrofera Blue. He has home health predominantly for Foley catheter management. There have been some issues getting the Hydrofera Blue through home health but apparently the patient's wife has some supplies and is managing to get these changed.They come every 3 weeks because of transportation difficulties 05-28-2022 upon evaluation today patient appears to be doing decently well currently in regard to his wounds. He still has a depth to the wound in the sacral region but again this does seem to be a little bit less than previous. He has been using Hydrofera Blue both on this in the heel ulcer. Both are going to require some cleaning today sharp debridement on the heel I think is cleaning with saline gauze and the sterile Q-tip is probably sufficient for the sacral area which does not appear to have too much necrotic tissue. 06-18-2022 upon evaluation today patient's wounds are showing signs of doing decently well. Fortunately I do not see any signs of active infection locally nor systemically at this time which is great news. No fevers, chills, nausea, vomiting, or diarrhea. 07-15-2022 upon evaluation today patient appears to be doing well currently in regard to his wound. He has been tolerating the dressing changes without complication. Fortunately I think that the heel is doing well except for where it is causing pressure to the heel posteriorly and this apparently is due to some kind of exercise machine that his children got him. Nonetheless I am not sure that that is going to be helpful especially if it is causing discomfort and pain. And especially if it is making the wound worse which is an even bigger deal. With regard to the patient's sacral region this is draining quite significantly. I do think a wound VAC would be ideal here and I would recommend that we go  ahead and try to see about getting this ordered for him as soon as possible. The patient's wife and the patient are in agreement with this plan. 08-05-2022 upon evaluation today patient appears to be doing well currently in regard to both his heel and his sacral region. Unfortunately he is not eligible for a wound VAC as he is previously already undergone all the treatment that is allowable under Medicare guidelines for his sacral wound. This is quite unfortunate as I felt like that could have potentially help this speed this up but nonetheless it is where we stand. For that reason I think the [facility] is probably still the best way to continue at this point and I discussed that with the patient and his wife today. 08-26-2022 upon evaluation today patient appears to be doing well currently in regard to  his wound. He on the heel of his looking much better I did have to perform some debridement here. In regard to the sacral area no debridement was necessary it looks to clean this has been although there was a lot of Hydrofera Blue and there much more than is probably necessary. 09-15-2021 upon evaluation today patient appears to be doing well currently in regard to his heel ulcer which is looking okay although the gluteal region specifically the sacral wound is showing signs of some issues there. Fortunately I do not see any evidence of infection significantly to the heel although I am not so sure about the sacral region. I think he probably would benefit from initiation of antibiotics. 10-01-2022 upon evaluation today patient unfortunately had a hospital stay where he ended up having his catheter which had been apparently blowing up into the urethra. With that being said he had traumatic bleeding and subsequently ended up in the hospital I did review that discharge summary as well. The good news is he is doing somewhat better at this point but he did end up laying on his backside a lot causing the wound  on the sacral area to actually be worse today compared to where we have been previous. This is not good and not the direction we want to go. Again the depth is what was actually more significant. 10-22-2022 upon evaluation today patient appears to be doing decently well in regard to his heel ulcer. I do not see any signs of active infection locally nor systemically at this time which is great news. No fevers, chills, nausea, vomiting, or diarrhea. 11-12-2022 upon evaluation today patient appears to be doing well currently with regard to his heel ulcer which is actually showing signs of excellent progress. Fortunately I do not see any signs of infection and I think that he is making good progress here. Fortunately I do not see any signs of infection with regard to the sacral area either. 12-03-2022 upon evaluation today patient appears to be doing well currently in regard to his wounds. The heel looks to be almost healed but it is still open. With that being said regular continue with the [facility] to both locations. Unfortunately regard to the gluteal region this is not doing quite as well. I discussed with the patient and his wife that we really need to try to see what can be done in order to make sure this is pectin well and stays in. We also had an incident where the patient's wife has a picture of where a plastic bag containing Hydrofera Blue inside of it was actually taped to the wound on his gluteal region WAYLIN, SLUIS ([id number]) [id number] Page 4 of 11 followed by an ABD pad secured with tape honestly if I did not see the pictures I would not do believe that she tells me that she is actually going to head over to [facility] home office when she leaves here to have a discussion with somebody about this as that is unacceptable I agree. 12-24-2022 upon evaluation today patient appears to be doing well currently in regard to his heel which is looking better the  sacral area is not looking quite as good. It is not infected and is not worse but is also not a lot improved compared to last time I saw him. Again he still is not staying off of this asking today how he was doing with staying off of his gluteal region and offloading and he tells me not  very good. 01-14-2023 upon evaluation today patient appears to be doing well currently in regard to his sacral wound which I feel like is making some headway towards closure little by little. Unfortunately his heel is actually significantly worse compared to last I saw him. Fortunately I do not see any signs of infection at this time. Electronic Signature(s) Signed: 01/14/2023 10:03:35 AM By: Allen Derry PA-C Entered By: Allen Derry on 01/14/2023 10:03:35 -------------------------------------------------------------------------------- Physical Exam Details Patient Name: Date of Service: [name] [facility] 01/14/2023 8:45 A M Medical Record Number: [id number] Patient Account Number: [id number] Date of Birth/Sex: Treating RN: [birth year] (66 y.o. [name]) [name] Primary Care Provider: Dione Booze Other Clinician: Referring Provider: Treating Provider/Extender: [name] in Treatment: 48 Constitutional Well-nourished and well-hydrated in no acute distress. Respiratory normal breathing without difficulty. Psychiatric this patient is able to make decisions and demonstrates good insight into disease process. Alert and Oriented x 3. pleasant and cooperative. Notes Upon inspection patient's wound bed actually showed signs of good granulation epithelization at this point did perform debridement in regard to the heel clearway necrotic debris he tolerated that without complication. And postdebridement wound bed appears to be doing significantly better which is great news. Electronic Signature(s) Signed: 01/14/2023 10:03:54 AM By: Allen Derry PA-C Entered By: Allen Derry on 01/14/2023  10:03:54 -------------------------------------------------------------------------------- Physician Orders Details Patient Name: Date of Service: [name] [facility] 01/14/2023 8:45 A M Medical Record Number: [id number] Patient Account Number: [id number] Date of Birth/Sex: Treating RN: [birth year] (66 y.o. [name]) [name] Primary Care Provider: Dione Booze Other Clinician: Referring Provider: Treating Provider/Extender: Pershing Proud Hurlock, [name] ([id number]) [id number] Page 5 of 11 Weeks in Treatment: 19 Verbal / Phone Orders: No Diagnosis Coding ICD-10 Coding Code Description L89.613 Pressure ulcer of right heel, stage 3 L89.623 Pressure ulcer of left heel, stage 3 L89.154 Pressure ulcer of sacral region, stage 4 L24.A0 Irritant contact dermatitis due to friction or contact with body fluids, unspecified L98.412 Non-pressure chronic ulcer of buttock with fat layer exposed M62.81 Muscle weakness (generalized) F31.9 Bipolar disorder, unspecified E11.622 Type 2 diabetes mellitus with other skin ulcer I10 Essential (primary) hypertension Z79.01 Long term (current) use of anticoagulants 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 Home Health [facility] for wound care. May utilize formulary equivalent dressing for wound treatment orders unless otherwise specified. Home Health Nurse may visit PRN to address patients wound care needs. Beverly Gust [phone] **Please direct any NON-WOUND related issues/requests for orders to patient's Primary Care Physician. **If current dressing causes regression in wound condition, may D/C ordered dressing product/s and apply Normal Saline Moist Dressing daily until next Wound Healing Center or Other MD appointment. **Notify Wound Healing Center of regression in wound condition at [phone]. Bathing/ Shower/ Hygiene No  tub bath. Anesthetic (Use 'Patient Medications' Section for Anesthetic Order Entry) Lidocaine applied to wound bed Edema Control - Lymphedema / Segmental Compressive Device / Other Elevate, Exercise Daily and A void Standing for Long Periods of Time. Elevate legs to the level of the heart and pump ankles as often as possible Elevate leg(s) parallel to the floor when sitting. DO YOUR BEST to sleep in the bed at night. DO NOT sleep in your recliner. Long hours of sitting in a recliner leads to swelling of the legs and/or potential wounds on your backside. Off-Loading Gel wheelchair cushion Low air-loss mattress (Group 2) - Sleep in bed every night. Turn and reposition every  2 hours - keep pressure off of the sacrum and heels wounds Other: - PRAFO boot in bed keep pressure off of sacrum/gluteus and heels- Additional Orders / Instructions Follow Nutritious Diet and Increase Protein Intake Wound Treatment Wound #5 - Calcaneus Wound Laterality: Right Cleanser: Normal Saline 3 x Per Week/30 Days Discharge Instructions: Wash your hands with soap and water. Remove old dressing, discard into plastic bag and place into trash. Cleanse the wound with Normal Saline prior to applying a clean dressing using gauze sponges, not tissues or cotton balls. Do not scrub or use excessive force. Pat dry using gauze sponges, not tissue or cotton balls. Cleanser: Soap and Water 3 x Per Week/30 Days Discharge Instructions: Gently cleanse wound with antibacterial soap, rinse and pat dry prior to dressing wounds Prim Dressing: Hydrofera Blue Ready Transfer Foam, 2.5x2.5 (in/in) ary 3 x Per Week/30 Days Discharge Instructions: cut to size of wound Secondary Dressing: Gauze 3 x Per Week/30 Days Discharge Instructions: As directed: dry, moistened with saline or moistened with Dakins Solution Secured With: Medipore T - 32M Medipore H Soft Cloth Surgical T ape ape, 2x2 (in/yd) 3 x Per Week/30 Days Wound #7 -  Sacrum Cleanser: Normal Saline 1 x Per Day/30 Days Discharge Instructions: Wash your hands with soap and water. Remove old dressing, discard into plastic bag and place into trash. Cleanse the wound with Normal Saline prior to applying a clean dressing using gauze sponges, not tissues or cotton balls. Do not scrub or use excessive force. Pat dry using gauze sponges, not tissue or cotton balls. [name], [name] ([id number]) [id number] Page 6 of 11 Cleanser: Soap and Water 1 x Per Day/30 Days Discharge Instructions: Gently cleanse wound with antibacterial soap, rinse and pat dry prior to dressing wounds Topical: calmoseptine 1 x Per Day/30 Days Discharge Instructions: apply to excoriated areas / periwound Prim Dressing: Hydrofera Blue Ready Transfer Foam, 4x5 (in/in) 1 x Per Day/30 Days ary Discharge Instructions: Apply Hydrofera Blue Ready to wound bed as directed Secondary Dressing: ABD Pad 5x9 (in/in) 1 x Per Day/30 Days Discharge Instructions: Cover with ABD pad Secured With: Medipore T - 32M Medipore H Soft Cloth Surgical T ape ape, 2x2 (in/yd) 1 x Per Day/30 Days Electronic Signature(s) Signed: 01/14/2023 5:59:11 PM By: Allen Derry PA-C Signed: 01/16/2023 3:42:23 PM By: [name] RN Entered By: [name] on 01/14/2023 09:19:21 -------------------------------------------------------------------------------- Problem List Details Patient Name: Date of Service: [name] [facility] 01/14/2023 8:45 A M Medical Record Number: [id number] Patient Account Number: [id number] Date of Birth/Sex: Treating RN: 1955/12/19 (66 y.o. [name] Primary Care Provider: Dione Booze Other Clinician: Referring Provider: Treating Provider/Extender: [name] in Treatment: 59 Active Problems ICD-10 Encounter Code Description Active Date MDM Diagnosis L89.613 Pressure ulcer of right heel, stage 3 11/22/2021 No Yes L89.623  Pressure ulcer of left heel, stage 3 11/22/2021 No Yes L89.154 Pressure ulcer of sacral region, stage 4 11/22/2021 No Yes L24.A0 Irritant contact dermatitis due to friction or contact with body fluids, 11/22/2021 No Yes unspecified L98.412 Non-pressure chronic ulcer of buttock with fat layer exposed 11/22/2021 No Yes M62.81 Muscle weakness (generalized) 11/22/2021 No Yes F31.9 Bipolar disorder, unspecified 11/22/2021 No Yes [place], [name] ([id number]) [id number] Page 7 of 11 E11.622 Type 2 diabetes mellitus with other skin ulcer 11/22/2021 No Yes I10 Essential (primary) hypertension 11/22/2021 No Yes Z79.01 Long term (current) use of anticoagulants 11/22/2021 No Yes 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 11/22/2021 No Yes Inactive Problems Resolved Problems Electronic  Signature(s) Signed: 01/14/2023 9:18:08 AM By: Allen Derry PA-C Entered By: Allen Derry on 01/14/2023 09:18:08 -------------------------------------------------------------------------------- Progress Note Details Patient Name: Date of Service: [name] [facility] 01/14/2023 8:45 A M Medical Record Number: [id number] Patient Account Number: [id number] Date of Birth/Sex: Treating RN: Nov 17, 1955 (66 y.o. [name]) [name] Primary Care Provider: Dione Booze Other Clinician: Referring Provider: Treating Provider/Extender: [name] in Treatment: 59 Subjective Chief Complaint Information obtained from Patient Sacral, right gluteal, and bilateral heel ulcers History of Present Illness (HPI) 05/29/2021 this is a patient who presents today for initial inspection here in the clinic concerning wounds that he has over the right heel and the sacral region. Unfortunately the sacral wound is starting to spread off to the right gluteal location due to how he sits always leaning towards the right side in his chair. His wife is present she is the primary caregiver  though she is not home with him all the time she does have to work. She does do an excellent job however it appears to be in trying to keep things under good control for him. The patient is not able to change positions himself nor walk by himself so he is pretty much at the mercy of the position he is putting when she is gone and this tends to be his chair which she sits and most of the day. Obviously this I think is the main culprit for what is going on currently. It was actually in January 2020 when the sacral wound started. It was in September 2022 when the wound started to spread more to the right gluteal location. Subsequently in August 2022 is when he had been in a skilled nursing facility and the heel started to give him trouble as well. That does not seem to be doing nearly as poorly as the sacral region. He was hospitalized in October 2022 secondary to sepsis and this was in regard to the foot and was sent to skilled nursing again he is now back at home. He did have a wound VAC for the sacral wound over the summer 2022 but being in and out of facilities this ended up getting sent back. The patient does have [facility] home health that comes out 1 time per week to help with care. His most recent hemoglobin A1c was 6.9 in August 2022. 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 s. 07/24/2021 upon evaluation today patient appears to be doing decently well in regard to the pressure ulcer on the right heel as well as the sacral region. In general I think you are making some progress here which is great news. Overall the heel unfortunately had already closed previously when we saw him although it apparently reopened when he was working with physical therapy according to his wife. The area in the  sacral region is doing well and looks clean [name], [name] ([id number]) [id number] Page 8 of 11 there is still some depth here but I still think it would be difficult to wound VAC this region. His wife does an awesome job taking care of him. Is been so long since we have seen him because he has been in the hospital to be honest. 08/07/2021 upon evaluation today patient appears to be doing well at this time. Fortunately I do not see any signs of active infection locally  or systemically at this time which is great news. No fevers, chills, nausea, vomiting, or diarrhea. Unfortunately after I saw him on the 24th he actually ended up in the hospital in the 27th due to being septic. This was not due to the wounds but after looking at records actually due to a UTI. Fortunately he is doing much better and very happy in that regard. I do not see any signs of infection locally nor systemically at this time. 08/21/2021 upon evaluation today patient appears to be doing well with regard to his wound. Fortunately I think that the sacral region is doing decently well at this point which is great news. With regard to the foot this also does have some slough noted but I feel like we are making progress here. He does have some irritation around the right upper thigh/gluteal region. I feel like it is more towards the thigh. Nonetheless this does appear to be pressure related he spends a lot of time sitting up his caregiver states he really will not get in the bed and stay there like he should. 09/04/2021 upon evaluation patient appears to be doing decently well today in regard to the wound on his heel as well as the sacral area. I am actually very pleased with both and how things are appearing currently. There does not appear to be any evidence of active infection I think that his caregiver is doing an awesome job with regard to the wound care here. She is present today as well and I did  discuss this with her. 09/25/2021 upon evaluation today patient appears to be doing well with regard to his wound on the sacral region. His right heel is also doing well. Unfortunately he has a new area on his left heel which does appear to be pressure injury. I do not see any evidence of active infection locally or systemically which is great news no fevers, chills, nausea, vomiting, or diarrhea. Readmission: 11-22-2021 upon evaluation today patient presents for reevaluation here in the clinic concerning issues with his wounds. Since have last seen him he was in the hospital and then subsequently ended up in a skilled nursing facility. Subsequently period of time in the facility he actually had a breakdown in the right thigh location which has been an issue for him. Fortunately I do not see any evidence of active infection locally or systemically which is great news and I am very pleased in that regard. Nonetheless he does have still the wounds on both heels as well as the wound in the sacral area and the new area in the right upper thigh/gluteal region. 12-13-2021 upon evaluation today patient appears to be doing well currently in regard to his wounds. The left heel is going to require some sharp debridement. Fortunately the right heel seems to be doing quite well. Overall I think his gluteal region as well as sacral region also are doing well. 01-10-2022 upon evaluation today patient appears to be doing better in regard to some areas unfortunately his sacral area is not doing as well. It seems of gotten worse his caregiver states when he was in the hospital recently they really did not offloading. Fortunately I do not see any evidence of active infection locally or systemically at this time. 02-01-2022 upon evaluation today patient appears to be doing well currently in regard to his wounds. He is going require some sharp debridement of clearway some of the necrotic debris. Fortunately I do not see any  evidence of  active infection locally or systemically which is great news. No fevers, chills, nausea, vomiting, or diarrhea. 02-22-2022 upon evaluation today patient appears to be doing poorly in regard to his right heel the left heel is completely closed. Unfortunately I think he is got some deep tissue injury to this area I am going to do a culture at this spot. With that being said he is having 2 other significant issues in regard to the left thigh this is showing signs of cellulitis all the way down into the thigh and was seeing definite temperature difference in the left thigh versus the right thigh when feeling. It also is of note that the patient is also having some issues here with trouble in regard to having fevers his wife has been giving him Tylenol and ibuprofen to help keep the fevers under control but he has been as high as 102 this is pretty much been going on since Monday or Tuesday of this week. Nonetheless I am concerned I am not exactly sure where the cellulitis on the left thigh is coming from not sure if this is emanating from his sacral region that is a possibility or if there is something completely different going on either way with the fevers I am more worried that he needs to go get this checked out as soon as possible. 03-08-2022 upon evaluation today patient appears to be doing well with regard to his heel as well as sacral region. Both are showing signs of significant improvement compared to the last time I saw him. Overall I think we are in a much better spot 04-04-2022 upon evaluation today patient appears to be doing about the same in regard to his wounds. I do not see any signs of improvement nor worsening at this point. Fortunately there is no evidence of active infection at this time. He is not staying off of this is much as he needs to in fact is not even sleeping in the hospital bed with the alternating air mattress. 04-16-2022 upon evaluation today patient appears to  be doing well currently in regard to his wound. He has been tolerating the dressing changes without complication. Fortunately I do not see any evidence of infection. The heel actually looks quite well the sacral area actually is still quite deep. From my questioning and discussion with him today I do not believe that he is really staying off of this as much as he should. He seemed a little uncomfortable during that portion of the conversation today. I think that is contributing quite a bit to his lack of progress with regard to healing. 11/7; patient with decubitus ulcers on his right heel as well as his lower sacrum. We have been using Hydrofera Blue. He has home health predominantly for Foley catheter management. There have been some issues getting the Hydrofera Blue through home health but apparently the patient's wife has some supplies and is managing to get these changed.They come every 3 weeks because of transportation difficulties 05-28-2022 upon evaluation today patient appears to be doing decently well currently in regard to his wounds. He still has a depth to the wound in the sacral region but again this does seem to be a little bit less than previous. He has been using Hydrofera Blue both on this in the heel ulcer. Both are going to require some cleaning today sharp debridement on the heel I think is cleaning with saline gauze and the sterile Q-tip is probably sufficient for the sacral area which does  not appear to have too much necrotic tissue. 06-18-2022 upon evaluation today patient's wounds are showing signs of doing decently well. Fortunately I do not see any signs of active infection locally nor systemically at this time which is great news. No fevers, chills, nausea, vomiting, or diarrhea. 07-15-2022 upon evaluation today patient appears to be doing well currently in regard to his wound. He has been tolerating the dressing changes without complication. Fortunately I think that the heel  is doing well except for where it is causing pressure to the heel posteriorly and this apparently is due to some kind of exercise machine that his children got him. Nonetheless I am not sure that that is going to be helpful especially if it is causing discomfort and pain. And especially if it is making the wound worse which is an even bigger deal. With regard to the patient's sacral region this is draining quite significantly. I do think a wound VAC would be ideal here and I would recommend that we go ahead and try to see about getting this ordered for him as soon as possible. The patient's wife and the patient are in agreement with this plan. 08-05-2022 upon evaluation today patient appears to be doing well currently in regard to both his heel and his sacral region. Unfortunately he is not eligible for a wound VAC as he is previously already undergone all the treatment that is allowable under Medicare guidelines for his sacral wound. This is quite unfortunate as I felt like that could have potentially help this speed this up but nonetheless it is where we stand. For that reason I think the [facility] is probably still the best way to continue at this point and I discussed that with the patient and his wife today. 08-26-2022 upon evaluation today patient appears to be doing well currently in regard to his wound. He on the heel of his looking much better I did have to perform some debridement here. In regard to the sacral area no debridement was necessary it looks to clean this has been although there was a lot of Hydrofera Blue and there much more than is probably necessary. 09-15-2021 upon evaluation today patient appears to be doing well currently in regard to his heel ulcer which is looking okay although the gluteal region specifically the sacral wound is showing signs of some issues there. Fortunately I do not see any evidence of infection significantly to the heel although I am not so sure about  the sacral region. I think he probably would benefit from initiation of antibiotics. [name], [name] ([id number]) [id number] Page 9 of 11 10-01-2022 upon evaluation today patient unfortunately had a hospital stay where he ended up having his catheter which had been apparently blowing up into the urethra. With that being said he had traumatic bleeding and subsequently ended up in the hospital I did review that discharge summary as well. The good news is he is doing somewhat better at this point but he did end up laying on his backside a lot causing the wound on the sacral area to actually be worse today compared to where we have been previous. This is not good and not the direction we want to go. Again the depth is what was actually more significant. 10-22-2022 upon evaluation today patient appears to be doing decently well in regard to his heel ulcer. I do not see any signs of active infection locally nor systemically at this time which is great news. No fevers, chills, nausea,  vomiting, or diarrhea. 11-12-2022 upon evaluation today patient appears to be doing well currently with regard to his heel ulcer which is actually showing signs of excellent progress. Fortunately I do not see any signs of infection and I think that he is making good progress here. Fortunately I do not see any signs of infection with regard to the sacral area either. 12-03-2022 upon evaluation today patient appears to be doing well currently in regard to his wounds. The heel looks to be almost healed but it is still open. With that being said regular continue with the [facility] to both locations. Unfortunately regard to the gluteal region this is not doing quite as well. I discussed with the patient and his wife that we really need to try to see what can be done in order to make sure this is pectin well and stays in. We also had an incident where the patient's wife has a picture of where a  plastic bag containing Hydrofera Blue inside of it was actually taped to the wound on his gluteal region followed by an ABD pad secured with tape honestly if I did not see the pictures I would not do believe that she tells me that she is actually going to head over to [facility] home office when she leaves here to have a discussion with somebody about this as that is unacceptable I agree. 12-24-2022 upon evaluation today patient appears to be doing well currently in regard to his heel which is looking better the sacral area is not looking quite as good. It is not infected and is not worse but is also not a lot improved compared to last time I saw him. Again he still is not staying off of this asking today how he was doing with staying off of his gluteal region and offloading and he tells me not very good. 01-14-2023 upon evaluation today patient appears to be doing well currently in regard to his sacral wound which I feel like is making some headway towards closure little by little. Unfortunately his heel is actually significantly worse compared to last I saw him. Fortunately I do not see any signs of infection at this time. Objective Constitutional Well-nourished and well-hydrated in no acute distress. Vitals Time Taken: 8:53 AM, Height: 66 in, Weight: 279 lbs, BMI: 45, Temperature: 97.6 F, Pulse: 75 bpm, Respiratory Rate: 18 breaths/min, Blood Pressure: 163/80 mmHg. Respiratory normal breathing without difficulty. Psychiatric this patient is able to make decisions and demonstrates good insight into disease process. Alert and Oriented x 3. pleasant and cooperative. General Notes: Upon inspection patient's wound bed actually showed signs of good granulation epithelization at this point did perform debridement in regard to the heel clearway necrotic debris he tolerated that without complication. And postdebridement wound bed appears to be doing significantly better which is great news. Integumentary  (Hair, Skin) Wound #5 status is Open. Original cause of wound was Pressure Injury. The date acquired was: 07/01/2021. The wound has been in treatment 59 weeks. The wound is located on the Right Calcaneus. The wound measures 2.2cm length x 3cm width x 0.3cm depth; 5.184cm^2 area and 1.555cm^3 volume. There is no tunneling or undermining noted. There is a medium amount of serosanguineous drainage noted. There is medium (34-66%) red granulation within the wound bed. There is a medium (34-66%) amount of necrotic tissue within the wound bed including Adherent Slough. Wound #7 status is Open. Original cause of wound was Pressure Injury. The date acquired was: 07/01/2021. The wound  has been in treatment 59 weeks. The wound is located on the Sacrum. The wound measures 3cm length x 2.5cm width x 4cm depth; 5.89cm^2 area and 23.562cm^3 volume. There is Fat Layer (Subcutaneous Tissue) exposed. There is no tunneling or undermining noted. There is a medium amount of serosanguineous drainage noted. There is large (67- 100%) red granulation within the wound bed. There is no necrotic tissue within the wound bed. Assessment Active Problems ICD-10 Pressure ulcer of right heel, stage 3 Pressure ulcer of left heel, stage 3 Pressure ulcer of sacral region, stage 4 Irritant contact dermatitis due to friction or contact with body fluids, unspecified Non-pressure chronic ulcer of buttock with fat layer exposed Muscle weakness (generalized) Bipolar disorder, unspecified Type 2 diabetes mellitus with other skin ulcer Essential (primary) hypertension Long term (current) use of anticoagulants Personal history of traumatic brain injury [name], [name] ([id number]) [id number] Page 10 of 11 Procedures Wound #5 Pre-procedure diagnosis of Wound #5 is a Pressure Ulcer located on the Right Calcaneus . There was a Excisional Skin/Subcutaneous Tissue Debridement with a total area of 5.18 sq cm  performed by Allen Derry, PA-C. With the following instrument(s): Curette to remove Viable and Non-Viable tissue/material. Material removed includes Subcutaneous Tissue and Slough and. No specimens were taken. A time out was conducted at 09:17, prior to the start of the procedure. A Minimum amount of bleeding was controlled with Pressure. The procedure was tolerated well with a pain level of 0 throughout and a pain level of 0 following the procedure. Post Debridement Measurements: 2.2cm length x 3cm width x 0.3cm depth; 1.555cm^3 volume. Post debridement Stage noted as Category/Stage III. Character of Wound/Ulcer Post Debridement is improved. Post procedure Diagnosis Wound #5: Same as Pre-Procedure Plan Follow-up Appointments: Return Appointment in 3 weeks. Home Health: [facility] for wound care. May utilize formulary equivalent dressing for wound treatment orders unless otherwise specified. Home Health Nurse may visit PRN to address patients wound care needs. Beverly Gust [phone] **Please direct any NON-WOUND related issues/requests for orders to patient's Primary Care Physician. **If current dressing causes regression in wound condition, may D/C ordered dressing product/s and apply Normal Saline Moist Dressing daily until next Wound Healing Center or Other MD appointment. **Notify Wound Healing Center of regression in wound condition at [phone]. Bathing/ Shower/ Hygiene: No tub bath. Anesthetic (Use 'Patient Medications' Section for Anesthetic Order Entry): Lidocaine applied to wound bed Edema Control - Lymphedema / Segmental Compressive Device / Other: Elevate, Exercise Daily and Avoid Standing for Long Periods of Time. Elevate legs to the level of the heart and pump ankles as often as possible Elevate leg(s) parallel to the floor when sitting. DO YOUR BEST to sleep in the bed at night. DO NOT sleep in your recliner. Long hours of sitting in a recliner leads to swelling  of the legs and/or potential wounds on your backside. Off-Loading: Gel wheelchair cushion Low air-loss mattress (Group 2) - Sleep in bed every night. Turn and reposition every 2 hours - keep pressure off of the sacrum and heels wounds Other: - PRAFO boot in bed keep pressure off of sacrum/gluteus and heels- Additional Orders / Instructions: Follow Nutritious Diet and Increase Protein Intake WOUND #5: - Calcaneus Wound Laterality: Right Cleanser: Normal Saline 3 x Per Week/30 Days Discharge Instructions: Wash your hands with soap and water. Remove old dressing, discard into plastic bag and place into trash. Cleanse the wound with Normal Saline prior to applying a clean dressing using gauze sponges, not tissues or  cotton balls. Do not scrub or use excessive force. Pat dry using gauze sponges, not tissue or cotton balls. Cleanser: Soap and Water 3 x Per Week/30 Days Discharge Instructions: Gently cleanse wound with antibacterial soap, rinse and pat dry prior to dressing wounds Prim Dressing: Hydrofera Blue Ready Transfer Foam, 2.5x2.5 (in/in) 3 x Per Week/30 Days ary Discharge Instructions: cut to size of wound Secondary Dressing: Gauze 3 x Per Week/30 Days Discharge Instructions: As directed: dry, moistened with saline or moistened with Dakins Solution Secured With: Medipore T - 66M Medipore H Soft Cloth Surgical T ape ape, 2x2 (in/yd) 3 x Per Week/30 Days WOUND #7: - Sacrum Wound Laterality: Cleanser: Normal Saline 1 x Per Day/30 Days Discharge Instructions: Wash your hands with soap and water. Remove old dressing, discard into plastic bag and place into trash. Cleanse the wound with Normal Saline prior to applying a clean dressing using gauze sponges, not tissues or cotton balls. Do not scrub or use excessive force. Pat dry using gauze sponges, not tissue or cotton balls. Cleanser: Soap and Water 1 x Per Day/30 Days Discharge Instructions: Gently cleanse wound with antibacterial soap,  rinse and pat dry prior to dressing wounds Topical: calmoseptine 1 x Per Day/30 Days Discharge Instructions: apply to excoriated areas / periwound Prim Dressing: Hydrofera Blue Ready Transfer Foam, 4x5 (in/in) 1 x Per Day/30 Days ary Discharge Instructions: Apply Hydrofera Blue Ready to wound bed as directed Secondary Dressing: ABD Pad 5x9 (in/in) 1 x Per Day/30 Days Discharge Instructions: Cover with ABD pad Secured With: Medipore T - 66M Medipore H Soft Cloth Surgical T ape ape, 2x2 (in/yd) 1 x Per Day/30 Days 1. I would recommend based on what we are seeing that we have the patient continue to monitor for any evidence of infection or worsening. Overall I do believe that he is making good headway towards closure with regard to the sacral area which is very slow. I have talked to him about offloading and the more he does this the faster I think this will heal. 2. In regard to the heel looks like it possibly wearing some shoes that were more dress shoe type may have caused this to breakdown faster and more frequently recently. With that being said I did discuss with the patient that I do believe he needs to continue to offload is much as possible to help keep the heels from breaking down more. We will see patient back for reevaluation in 3 weeks here in the clinic. If anything worsens or changes patient will contact our office for additional recommendations. Electronic Signature(s) [name], [name] ([id number]) [id number] Page 11 of 11 Signed: 01/14/2023 10:04:47 AM By: Allen Derry PA-C Entered By: Allen Derry on 01/14/2023 10:04:46 -------------------------------------------------------------------------------- SuperBill Details Patient Name: Date of Service: [name] [facility] 01/14/2023 Medical Record Number: [id number] Patient Account Number: [id number] Date of Birth/Sex: Treating RN: 11/16/1955 (66 y.o. [name]) [name] Primary Care Provider:  Dione Booze Other Clinician: Referring Provider: Treating Provider/Extender: [name] in Treatment: 59 Diagnosis Coding ICD-10 Codes Code Description [phone] Pressure ulcer of right heel, stage 3 L89.623 Pressure ulcer of left heel, stage 3 L89.154 Pressure ulcer of sacral region, stage 4 L24.A0 Irritant contact dermatitis due to friction or contact with body fluids, unspecified L98.412 Non-pressure chronic ulcer of buttock with fat layer exposed M62.81 Muscle weakness (generalized) F31.9 Bipolar disorder, unspecified E11.622 Type 2 diabetes mellitus with other skin ulcer I10 Essential (primary) hypertension Z79.01 Long term (current) use of anticoagulants Z87.820  Personal history of traumatic brain injury Facility Procedures : CPT4 Code: [id number] Description: 11042 - DEB SUBQ TISSUE 20 SQ CM/< ICD-10 Diagnosis Description L89.613 Pressure ulcer of right heel, stage 3 Modifier: Quantity: 1 Physician Procedures : CPT4 Code Description Modifier [id number] 11042 - WC PHYS SUBQ TISS 20 SQ CM ICD-10 Diagnosis Description L89.613 Pressure ulcer of right heel, stage 3 Quantity: 1 Electronic Signature(s) Signed: 01/14/2023 10:05:27 AM By: Allen Derry PA-C Entered By: Allen Derry on 01/14/2023 10:05:27

## 2023-01-16 NOTE — Progress Notes (Signed)
SIRR, KABEL ([id number]) [id number] Page 1 of 9 Visit Report for 01/14/2023 Arrival Information Details Patient Name: Date of Service: Ena Dawley [facility] 01/14/2023 8:45 A M Medical Record Number: [id number] Patient Account Number: [id number] Date of Birth/Sex: Treating RN: [birth year] (66 y.o. [name]) [name] Primary Care Jacey Pelc: Dione Booze Other Clinician: Referring Lonn Im: Treating Theodosia Bahena/Extender: [name] in Treatment: 3 Visit Information History Since Last Visit Added or deleted any medications: No Patient Arrived: Wheel Chair Any new allergies or adverse reactions: No Arrival Time: 08:52 Had a fall or experienced change in No Accompanied By: wife activities of daily living that may affect Transfer Assistance: None risk of falls: Patient Identification Verified: Yes Signs or symptoms of abuse/neglect since last visito No Secondary Verification Process Completed: Yes Hospitalized since last visit: No Patient Requires Transmission-Based Precautions: No Implantable device outside of the clinic excluding No Patient Has Alerts: Yes cellular tissue based products placed in the center Patient Alerts: Patient on Blood Thinner since last visit: Has Dressing in Place as Prescribed: Yes Pain Present Now: No Electronic Signature(s) Signed: 01/16/2023 3:42:23 PM By: [name] RN Entered By: [name] on 01/14/2023 08:53:23 -------------------------------------------------------------------------------- Clinic Level of Care Assessment Details Patient Name: Date of Service: Ena Dawley [facility] 01/14/2023 8:45 A M Medical Record Number: [id number] Patient Account Number: [id number] Date of Birth/Sex: Treating RN: [birth year] (66 y.o. [name]) [name] Primary Care Arius Harnois: Dione Booze Other Clinician: Referring Rocco Kerkhoff: Treating Aashka Salomone/Extender: [name] in  Treatment: 59 Clinic Level of Care Assessment Items TOOL 1 Quantity Score []  - 0 Use when EandM and Procedure is performed on INITIAL visit ASSESSMENTS - Nursing Assessment / Reassessment []  - 0 General Physical Exam (combine w/ comprehensive assessment (listed just below) when performed on new pt. evals) []  - 0 Comprehensive Assessment (HX, ROS, Risk Assessments, Wounds Hx, etc.) Lazo, Toa ([id number]) [id number] Page 2 of 9 ASSESSMENTS - Wound and Skin Assessment / Reassessment []  - 0 Dermatologic / Skin Assessment (not related to wound area) ASSESSMENTS - Ostomy and/or Continence Assessment and Care []  - 0 Incontinence Assessment and Management []  - 0 Ostomy Care Assessment and Management (repouching, etc.) PROCESS - Coordination of Care []  - 0 Simple Patient / Family Education for ongoing care []  - 0 Complex (extensive) Patient / Family Education for ongoing care []  - 0 Staff obtains Chiropractor, Records, T Results / Process Orders est []  - 0 Staff telephones HHA, Nursing Homes / Clarify orders / etc []  - 0 Routine Transfer to another Facility (non-emergent condition) []  - 0 Routine Hospital Admission (non-emergent condition) []  - 0 New Admissions / Manufacturing engineer / Ordering NPWT Apligraf, etc. , []  - 0 Emergency Hospital Admission (emergent condition) PROCESS - Special Needs []  - 0 Pediatric / Minor Patient Management []  - 0 Isolation Patient Management []  - 0 Hearing / Language / Visual special needs []  - 0 Assessment of Community assistance (transportation, D/C planning, etc.) []  - 0 Additional assistance / Altered mentation []  - 0 Support Surface(s) Assessment (bed, cushion, seat, etc.) INTERVENTIONS - Miscellaneous []  - 0 External ear exam []  - 0 Patient Transfer (multiple staff / Nurse, adult / Similar devices) []  - 0 Simple Staple / Suture removal (25 or less) []  - 0 Complex Staple / Suture removal (26  or more) []  - 0 Hypo/Hyperglycemic Management (do not check if billed separately) []  - 0 Ankle / Brachial Index (ABI) - do not check if billed separately Has the patient been seen  at the hospital within the last three years: Yes Total Score: 0 Level Of Care: ____ Electronic Signature(s) Signed: 01/16/2023 3:42:23 PM By: [name] RN Entered By: [name] on 01/14/2023 09:22:27 -------------------------------------------------------------------------------- Encounter Discharge Information Details Patient Name: Date of Service: Ena Dawley [facility] 01/14/2023 8:45 A M Medical Record Number: [id number] Patient Account Number: [id number] Date of Birth/Sex: Treating RN: 04-23-1956 (66 y.o. [name] Primary Care Nakiah Osgood: Dione Booze Other Clinician: Referring Montez Cuda: Treating Kalem Rockwell/Extender: [name] in Treatment: [address], [place] ([id number]) [id number] Page 3 of 9 Encounter Discharge Information Items Post Procedure Vitals Discharge Condition: Stable Temperature (F): 97.6 Ambulatory Status: Wheelchair Pulse (bpm): 75 Discharge Destination: Home Respiratory Rate (breaths/min): 18 Transportation: Private Auto Blood Pressure (mmHg): 163/80 Accompanied By: wife Schedule Follow-up Appointment: Yes Clinical Summary of Care: Electronic Signature(s) Signed: 01/16/2023 3:42:23 PM By: [name] RN Entered By: [name] on 01/14/2023 09:23:54 -------------------------------------------------------------------------------- Lower Extremity Assessment Details Patient Name: Date of Service: Ena Dawley [facility] 01/14/2023 8:45 A M Medical Record Number: [id number] Patient Account Number: [id number] Date of Birth/Sex: Treating RN: 04/26/1956 (66 y.o. [name]) [name] Primary Care Kamya Watling: Dione Booze Other Clinician: Referring Telisa Ohlsen: Treating Tim Wilhide/Extender: [name] in Treatment: 59 Electronic Signature(s) Signed: 01/16/2023 3:42:23 PM By: [name] RN Entered By: [name] on 01/14/2023 09:04:31 -------------------------------------------------------------------------------- Multi Wound Chart Details Patient Name: Date of Service: Ena Dawley [facility] 01/14/2023 8:45 A M Medical Record Number: [id number] Patient Account Number: [id number] Date of Birth/Sex: Treating RN: 07-23-1955 (66 y.o. [name] Primary Care Makya Yurko: Dione Booze Other Clinician: Referring Neville Pauls: Treating Virdia Ziesmer/Extender: [name] in Treatment: 59 Vital Signs Height(in): 66 Pulse(bpm): 75 Weight(lbs): 279 Blood Pressure(mmHg): 163/80 Body Mass Index(BMI): 45 Temperature(F): 97.6 Respiratory Rate(breaths/min): 18 [5:Photos:] Bussey, Tallan ([id number]) [5:Photos:] [[id number]/A:[id number] Page 4 of 9 N/A] Right Calcaneus Sacrum N/A Wound Location: Pressure Injury Pressure Injury N/A Wounding Event: Pressure Ulcer Pressure Ulcer N/A Primary Etiology: Anemia, Sleep Apnea, Coronary Artery Anemia, Sleep Apnea, Coronary Artery N/A Comorbid History: Disease, Deep Vein Thrombosis, Disease, Deep Vein Thrombosis, Hypertension, Type II Diabetes, Hypertension, Type II Diabetes, History of pressure wounds, History of pressure wounds, Neuropathy Neuropathy 07/01/2021 07/01/2021 N/A Date Acquired: 24 59 N/A Weeks of Treatment: Open Open N/A Wound Status: No No N/A Wound Recurrence: 2.2x3x0.3 3x2.5x4 N/A Measurements L x W x D (cm) 5.184 5.89 N/A A (cm) : rea 1.555 23.562 N/A Volume (cm) : -25.20% -87.50% N/A % Reduction in A rea: -275.60% -150.00% N/A % Reduction in Volume: Category/Stage III Category/Stage IV N/A Classification: Medium Medium N/A Exudate A mount: Serosanguineous Serosanguineous N/A Exudate Type: red, brown red, brown N/A Exudate Color: Medium (34-66%) Large  (67-100%) N/A Granulation A mount: Red Red N/A Granulation Quality: Medium (34-66%) None Present (0%) N/A Necrotic A mount: Fascia: No Fat Layer (Subcutaneous Tissue): Yes N/A Exposed Structures: Fat Layer (Subcutaneous Tissue): No Fascia: No Tendon: No Tendon: No Muscle: No Muscle: No Joint: No Joint: No Bone: No Bone: No Large (67-100%) None N/A Epithelialization: Treatment Notes Electronic Signature(s) Signed: 01/16/2023 3:42:23 PM By: [name] RN Entered By: [name] on 01/14/2023 09:04:36 -------------------------------------------------------------------------------- Multi-Disciplinary Care Plan Details Patient Name: Date of Service: [name] Sickles [facility] 01/14/2023 8:45 A M Medical Record Number: [id number] Patient Account Number: [id number] Date of Birth/Sex: Treating RN: March 08, 1956 (66 y.o. [name] Primary Care Osmani Kersten: Dione Booze Other Clinician: Referring Chelsa Stout: Treating Jaqualin Serpa/Extender: [name] in Treatment: 55 Active Inactive Wound/Skin  Impairment Nursing Diagnoses: Knowledge deficit related to ulceration/compromised skin integrity QUAN, CYBULSKI ([id number]) [id number] Page 5 of 9 Goals: Patient/caregiver will verbalize understanding of skin care regimen Date Initiated: 11/22/2021 Target Resolution Date: 01/23/2023 Goal Status: Active Ulcer/skin breakdown will have a volume reduction of 30% by week 4 Date Initiated: 11/22/2021 Date Inactivated: 01/10/2022 Target Resolution Date: 12/23/2021 Goal Status: Unmet Unmet Reason: comorbities Ulcer/skin breakdown will have a volume reduction of 50% by week 8 Date Initiated: 11/22/2021 Date Inactivated: 05/07/2022 Target Resolution Date: 01/22/2022 Goal Status: Unmet Unmet Reason: comorbidities Ulcer/skin breakdown will have a volume reduction of 80% by week 12 Date Initiated: 11/22/2021 Date Inactivated: 05/07/2022 Target  Resolution Date: 02/22/2022 Goal Status: Unmet Unmet Reason: comorbidities Ulcer/skin breakdown will heal within 14 weeks Date Initiated: 11/22/2021 Date Inactivated: 05/07/2022 Target Resolution Date: 03/25/2022 Goal Status: Unmet Unmet Reason: comorbidities Interventions: Assess patient/caregiver ability to obtain necessary supplies Assess patient/caregiver ability to perform ulcer/skin care regimen upon admission and as needed Assess ulceration(s) every visit Notes: Electronic Signature(s) Signed: 01/16/2023 3:42:23 PM By: [name] RN Entered By: [name] on 01/14/2023 09:04:49 -------------------------------------------------------------------------------- Pain Assessment Details Patient Name: Date of Service: Ena Dawley [facility] 01/14/2023 8:45 A M Medical Record Number: [id number] Patient Account Number: [id number] Date of Birth/Sex: Treating RN: 1956-06-18 (66 y.o. [name] Primary Care Yuko Coventry: Dione Booze Other Clinician: Referring Aldine Chakraborty: Treating Melroy Bougher/Extender: [name] in Treatment: 59 Active Problems Location of Pain Severity and Description of Pain Patient Has Paino No Site Locations Pain Management and Medication HARSH, TRULOCK ([id number]) [id number] Page 6 of 9 Current Pain Management: Electronic Signature(s) Signed: 01/16/2023 3:42:23 PM By: [name] RN Entered By: [name] on 01/14/2023 08:57:12 -------------------------------------------------------------------------------- Patient/Caregiver Education Details Patient Name: Date of Service: Ena Dawley [facility] 7/16/2024andnbsp8:45 A M Medical Record Number: [id number] Patient Account Number: [id number] Date of Birth/Gender: Treating RN: 1956/04/29 (66 y.o. [name]) [name] Primary Care Physician: Dione Booze Other Clinician: Referring Physician: Treating Physician/Extender: [name] in Treatment: 43 Education Assessment Education Provided To: Patient Education Topics Provided Wound/Skin Impairment: Handouts: Caring for Your Ulcer Methods: Explain/Verbal Responses: State content correctly Electronic Signature(s) Signed: 01/16/2023 3:42:23 PM By: [name] RN Entered By: [name] on 01/14/2023 09:05:07 -------------------------------------------------------------------------------- Wound Assessment Details Patient Name: Date of Service: Ena Dawley [facility] 01/14/2023 8:45 A M Medical Record Number: [id number] Patient Account Number: [id number] Date of Birth/Sex: Treating RN: [birth year] (66 y.o. [name] Primary Care Briyah Wheelwright: Dione Booze Other Clinician: Referring Marka Treloar: Treating Cierria Height/Extender: [name] in Treatment: 59 Wound Status Wound Number: 5 Primary Pressure Ulcer Etiology: Wound Location: Right Calcaneus Wound Open Jarecki, [name] ([id number]) [id number] Page 7 of 9 Wound Open Wounding Event: Pressure Injury Status: Date Acquired: 07/01/2021 Comorbid Anemia, Sleep Apnea, Coronary Artery Disease, Deep Vein Weeks Of Treatment: 59 History: Thrombosis, Hypertension, Type II Diabetes, History of pressure Clustered Wound: No wounds, Neuropathy Photos Wound Measurements Length: (cm) 2.2 Width: (cm) 3 Depth: (cm) 0.3 Area: (cm) 5.184 Volume: (cm) 1.555 % Reduction in Area: -25.2% % Reduction in Volume: -275.6% Epithelialization: Large (67-100%) Tunneling: No Undermining: No Wound Description Classification: Category/Stage III Exudate Amount: Medium Exudate Type: Serosanguineous Exudate Color: red, brown Foul Odor After Cleansing: No Slough/Fibrino Yes Wound Bed Granulation Amount: Medium (34-66%) Exposed Structure Granulation Quality: Red Fascia Exposed: No Necrotic Amount: Medium (34-66%) Fat Layer (Subcutaneous Tissue) Exposed:  No Necrotic Quality: Adherent Slough Tendon Exposed: No Muscle Exposed: No Joint Exposed: No Bone Exposed: No Treatment Notes  Wound #5 (Calcaneus) Wound Laterality: Right Cleanser Normal Saline Discharge Instruction: Wash your hands with soap and water. Remove old dressing, discard into plastic bag and place into trash. Cleanse the wound with Normal Saline prior to applying a clean dressing using gauze sponges, not tissues or cotton balls. Do not scrub or use excessive force. Pat dry using gauze sponges, not tissue or cotton balls. Soap and Water Discharge Instruction: Gently cleanse wound with antibacterial soap, rinse and pat dry prior to dressing wounds Peri-Wound Care Topical Primary Dressing Hydrofera Blue Ready Transfer Foam, 2.5x2.5 (in/in) Discharge Instruction: cut to size of wound Secondary Dressing Gauze Discharge Instruction: As directed: dry, moistened with saline or moistened with Dakins Solution Secured With Medipore T - 37M Medipore H Soft Cloth Surgical T ape ape, 2x2 (in/yd) Compression Wrap Compression Stockings Add-Ons Electronic Signature(s) CARSTEN, CARSTARPHEN ([id number]) [id number] Page 8 of 9 Signed: 01/16/2023 3:42:23 PM By: [name] RN Entered By: [name] on 01/14/2023 09:03:58 -------------------------------------------------------------------------------- Wound Assessment Details Patient Name: Date of Service: Ena Dawley [facility] 01/14/2023 8:45 A M Medical Record Number: [id number] Patient Account Number: [id number] Date of Birth/Sex: Treating RN: 13-Aug-1955 (66 y.o. [name]) [name] Primary Care Alyiah Ulloa: Dione Booze Other Clinician: Referring Shreyas Piatkowski: Treating Shakeita Vandevander/Extender: [name] in Treatment: 59 Wound Status Wound Number: 7 Primary Pressure Ulcer Etiology: Wound Location: Sacrum Wound Open Wounding Event: Pressure Injury Status: Date Acquired:  07/01/2021 Comorbid Anemia, Sleep Apnea, Coronary Artery Disease, Deep Vein Weeks Of Treatment: 59 History: Thrombosis, Hypertension, Type II Diabetes, History of pressure Clustered Wound: No wounds, Neuropathy Photos Wound Measurements Length: (cm) 3 Width: (cm) 2.5 Depth: (cm) 4 Area: (cm) 5.89 Volume: (cm) 23.562 % Reduction in Area: -87.5% % Reduction in Volume: -150% Epithelialization: None Tunneling: No Undermining: No Wound Description Classification: Category/Stage IV Exudate Amount: Medium Exudate Type: Serosanguineous Exudate Color: red, brown Foul Odor After Cleansing: No Slough/Fibrino Yes Wound Bed Granulation Amount: Large (67-100%) Exposed Structure Granulation Quality: Red Fascia Exposed: No Necrotic Amount: None Present (0%) Fat Layer (Subcutaneous Tissue) Exposed: Yes Tendon Exposed: No Muscle Exposed: No Joint Exposed: No Bone Exposed: No Treatment Notes Wound #7 (Sacrum) Santizo, [name] ([id number]) [id number] Page 9 of 9 Cleanser Normal Saline Discharge Instruction: Wash your hands with soap and water. Remove old dressing, discard into plastic bag and place into trash. Cleanse the wound with Normal Saline prior to applying a clean dressing using gauze sponges, not tissues or cotton balls. Do not scrub or use excessive force. Pat dry using gauze sponges, not tissue or cotton balls. Soap and Water Discharge Instruction: Gently cleanse wound with antibacterial soap, rinse and pat dry prior to dressing wounds Peri-Wound Care Topical calmoseptine Discharge Instruction: apply to excoriated areas / periwound Primary Dressing Hydrofera Blue Ready Transfer Foam, 4x5 (in/in) Discharge Instruction: Apply Hydrofera Blue Ready to wound bed as directed Secondary Dressing ABD Pad 5x9 (in/in) Discharge Instruction: Cover with ABD pad Secured With Medipore T - 37M Medipore H Soft Cloth Surgical T ape ape, 2x2  (in/yd) Compression Wrap Compression Stockings Add-Ons Electronic Signature(s) Signed: 01/16/2023 3:42:23 PM By: [name] RN Entered By: [name] on 01/14/2023 09:04:23 -------------------------------------------------------------------------------- Vitals Details Patient Name: Date of Service: Ena Dawley [facility] 01/14/2023 8:45 A M Medical Record Number: [id number] Patient Account Number: [id number] Date of Birth/Sex: Treating RN: 29-Sep-1955 (66 y.o. [name] Primary Care Khristopher Kapaun: Dione Booze Other Clinician: Referring Izamar Linden: Treating Lizzete Gough/Extender: [name] in Treatment: 59 Vital Signs Time Taken: 08:53 Temperature (F):  97.6 Height (in): 66 Pulse (bpm): 75 Weight (lbs): 279 Respiratory Rate (breaths/min): 18 Body Mass Index (BMI): 45 Blood Pressure (mmHg): 163/80 Reference Range: 80 - 120 mg / dl Electronic Signature(s) Signed: 01/16/2023 3:42:23 PM By: [name] RN Entered By: [name] on 01/14/2023 08:57:04

## 2023-02-04 ENCOUNTER — Encounter: Payer: Medicare Other | Attending: Physician Assistant | Admitting: Physician Assistant

## 2023-02-04 DIAGNOSIS — I10 Essential (primary) hypertension: Secondary | ICD-10-CM | POA: Insufficient documentation

## 2023-02-04 DIAGNOSIS — L89623 Pressure ulcer of left heel, stage 3: Secondary | ICD-10-CM | POA: Diagnosis not present

## 2023-02-04 DIAGNOSIS — E11622 Type 2 diabetes mellitus with other skin ulcer: Secondary | ICD-10-CM | POA: Insufficient documentation

## 2023-02-04 DIAGNOSIS — Z7901 Long term (current) use of anticoagulants: Secondary | ICD-10-CM | POA: Diagnosis not present

## 2023-02-04 DIAGNOSIS — L89613 Pressure ulcer of right heel, stage 3: Secondary | ICD-10-CM | POA: Insufficient documentation

## 2023-02-04 DIAGNOSIS — E1140 Type 2 diabetes mellitus with diabetic neuropathy, unspecified: Secondary | ICD-10-CM | POA: Insufficient documentation

## 2023-02-04 DIAGNOSIS — D649 Anemia, unspecified: Secondary | ICD-10-CM | POA: Diagnosis not present

## 2023-02-04 DIAGNOSIS — L98412 Non-pressure chronic ulcer of buttock with fat layer exposed: Secondary | ICD-10-CM | POA: Diagnosis not present

## 2023-02-04 DIAGNOSIS — Z86718 Personal history of other venous thrombosis and embolism: Secondary | ICD-10-CM | POA: Insufficient documentation

## 2023-02-04 DIAGNOSIS — L89154 Pressure ulcer of sacral region, stage 4: Secondary | ICD-10-CM | POA: Insufficient documentation

## 2023-02-04 DIAGNOSIS — G4730 Sleep apnea, unspecified: Secondary | ICD-10-CM | POA: Insufficient documentation

## 2023-02-04 NOTE — Progress Notes (Signed)
[name], [name] ([id number]) [id number] Page 1 of 2 Visit Report for 02/04/2023 Chief Complaint Document Details Patient Name: Date of Service: [name] [facility] 02/04/2023 8:45 A M Medical Record Number: [id number] Patient Account Number: [id number] Date of Birth/Sex: Treating RN: 1955-08-16 (66 y.o. [name]) [name] Primary Care Provider: Dione Booze Other Clinician: Referring Provider: Treating Provider/Extender: [name] in Treatment: 62 Information Obtained from: Patient Chief Complaint Sacral, right gluteal, and bilateral heel ulcers Electronic Signature(s) Signed: 02/04/2023 8:59:38 AM By: Allen Derry PA-C Entered By: Allen Derry on 02/04/2023 08:59:38 -------------------------------------------------------------------------------- Problem List Details Patient Name: Date of Service: [name] [facility] 02/04/2023 8:45 A M Medical Record Number: [id number] Patient Account Number: [id number] Date of Birth/Sex: Treating RN: 1955-08-22 (66 y.o. [name] Primary Care Provider: Dione Booze Other Clinician: Referring Provider: Treating Provider/Extender: [name] in Treatment: 86 Active Problems ICD-10 Encounter Code Description Active Date MDM Diagnosis L89.613 Pressure ulcer of right heel, stage 3 11/22/2021 No Yes L89.623 Pressure ulcer of left heel, stage 3 11/22/2021 No Yes L89.154 Pressure ulcer of sacral region, stage 4 11/22/2021 No Yes Blakesley, [name] ([id number]) [id number] Page 2 of 2 L24.A0 Irritant contact dermatitis due to friction or contact with body 11/22/2021 No Yes fluids, unspecified L98.412 Non-pressure chronic ulcer of buttock with fat layer exposed 11/22/2021 No Yes M62.81 Muscle weakness (generalized) 11/22/2021 No Yes F31.9 Bipolar disorder, unspecified 11/22/2021 No Yes E11.622 Type 2 diabetes mellitus with other  skin ulcer 11/22/2021 No Yes I10 Essential (primary) hypertension 11/22/2021 No Yes Z79.01 Long term (current) use of anticoagulants 11/22/2021 No Yes 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 11/22/2021 No Yes Inactive Problems Resolved Problems Electronic Signature(s) Signed: 02/04/2023 8:59:32 AM By: Allen Derry PA-C Entered By: Allen Derry on 02/04/2023 08:59:32

## 2023-02-04 NOTE — Progress Notes (Addendum)
SCIPIO, PRESSNALL ([id number]) [id number] Page 1 of 9 Visit Report for 02/04/2023 Arrival Information Details Patient Name: Date of Service: [name] [facility] 02/04/2023 8:45 A M Medical Record Number: [id number] Patient Account Number: [id number] Date of Birth/Sex: Treating RN: Nov 08, 1955 (66 y.o. [name]) [name] Primary Care : Dione Booze Other Clinician: Referring : Treating /Extender: [name] in Treatment: 80 Visit Information History Since Last Visit Added or deleted any medications: No Patient Arrived: Wheel Chair Any new allergies or adverse reactions: No Arrival Time: 09:08 Had a fall or experienced change in No Accompanied By: wife activities of daily living that may affect Transfer Assistance: Manual risk of falls: Patient Identification Verified: Yes Signs or symptoms of abuse/neglect since last visito No Secondary Verification Process Completed: Yes Hospitalized since last visit: No Patient Requires Transmission-Based Precautions: No Implantable device outside of the clinic excluding No Patient Has Alerts: Yes cellular tissue based products placed in the center Patient Alerts: Patient on Blood Thinner since last visit: Has Dressing in Place as Prescribed: Yes Pain Present Now: No Electronic Signature(s) Signed: 02/04/2023 3:47:31 PM By: [name] RN Entered By: [name] on 02/04/2023 09:09:34 -------------------------------------------------------------------------------- Clinic Level of Care Assessment Details Patient Name: Date of Service: [name] [facility] 02/04/2023 8:45 A M Medical Record Number: [id number] Patient Account Number: [id number] Date of Birth/Sex: Treating RN: October 18, 1955 (66 y.o. [name]) [name] Primary Care : Dione Booze Other Clinician: Referring : Treating /Extender: [name] in  Treatment: 62 Clinic Level of Care Assessment Items TOOL 1 Quantity Score []  - 0 Use when EandM and Procedure is performed on INITIAL visit ASSESSMENTS - Nursing Assessment / Reassessment []  - 0 General Physical Exam (combine w/ comprehensive assessment (listed just below) when performed on new pt. evals) []  - 0 Comprehensive Assessment (HX, ROS, Risk Assessments, Wounds Hx, etc.) Anes, Aiyden ([id number]) [phone].pdf Page 2 of 9 ASSESSMENTS - Wound and Skin Assessment / Reassessment []  - 0 Dermatologic / Skin Assessment (not related to wound area) ASSESSMENTS - Ostomy and/or Continence Assessment and Care []  - 0 Incontinence Assessment and Management []  - 0 Ostomy Care Assessment and Management (repouching, etc.) PROCESS - Coordination of Care []  - 0 Simple Patient / Family Education for ongoing care []  - 0 Complex (extensive) Patient / Family Education for ongoing care []  - 0 Staff obtains Chiropractor, Records, T Results / Process Orders est []  - 0 Staff telephones HHA, Nursing Homes / Clarify orders / etc []  - 0 Routine Transfer to another Facility (non-emergent condition) []  - 0 Routine Hospital Admission (non-emergent condition) []  - 0 New Admissions / Manufacturing engineer / Ordering NPWT Apligraf, etc. , []  - 0 Emergency Hospital Admission (emergent condition) PROCESS - Special Needs []  - 0 Pediatric / Minor Patient Management []  - 0 Isolation Patient Management []  - 0 Hearing / Language / Visual special needs []  - 0 Assessment of Community assistance (transportation, D/C planning, etc.) []  - 0 Additional assistance / Altered mentation []  - 0 Support Surface(s) Assessment (bed, cushion, seat, etc.) INTERVENTIONS - Miscellaneous []  - 0 External ear exam []  - 0 Patient Transfer (multiple staff / Nurse, adult / Similar devices) []  - 0 Simple Staple / Suture removal (25 or less) []  - 0 Complex Staple / Suture removal (26  or more) []  - 0 Hypo/Hyperglycemic Management (do not check if billed separately) []  - 0 Ankle / Brachial Index (ABI) - do not check if billed separately Has the patient been seen  at the hospital within the last three years: Yes Total Score: 0 Level Of Care: ____ Electronic Signature(s) Signed: 02/04/2023 3:47:31 PM By: [name] RN Entered By: [name] on 02/04/2023 09:34:56 -------------------------------------------------------------------------------- Encounter Discharge Information Details Patient Name: Date of Service: [name] [facility] 02/04/2023 8:45 A M Medical Record Number: [id number] Patient Account Number: [id number] Date of Birth/Sex: Treating RN: 1956/02/02 (66 y.o. [name] Primary Care : Dione Booze Other Clinician: Referring : Treating /Extender: [name] in Treatment: [address] [place] ([id number]) [id number] Page 3 of 9 Encounter Discharge Information Items Post Procedure Vitals Discharge Condition: Stable Temperature (F): 98.1 Ambulatory Status: Wheelchair Pulse (bpm): 73 Discharge Destination: Home Respiratory Rate (breaths/min): 18 Transportation: Private Auto Blood Pressure (mmHg): 120/73 Accompanied By: wife Schedule Follow-up Appointment: Yes Clinical Summary of Care: Electronic Signature(s) Signed: 02/04/2023 3:47:31 PM By: [name] RN Entered By: [name] on 02/04/2023 09:36:15 -------------------------------------------------------------------------------- Lower Extremity Assessment Details Patient Name: Date of Service: [name] [facility] 02/04/2023 8:45 A M Medical Record Number: [id number] Patient Account Number: [id number] Date of Birth/Sex: Treating RN: Jan 17, 1956 (66 y.o. [name]) [name] Primary Care : Dione Booze Other Clinician: Referring : Treating /Extender: [name] in Treatment: 75 Electronic Signature(s) Signed: 02/04/2023 3:47:31 PM By: [name] RN Entered By: [name] on 02/04/2023 09:11:59 -------------------------------------------------------------------------------- Multi Wound Chart Details Patient Name: Date of Service: [name] [facility] 02/04/2023 8:45 A M Medical Record Number: [id number] Patient Account Number: [id number] Date of Birth/Sex: Treating RN: [birth year] (66 y.o. [name] Primary Care : Dione Booze Other Clinician: Referring : Treating /Extender: [name] in Treatment: 62 Vital Signs Height(in): 66 Pulse(bpm): 73 Weight(lbs): 279 Blood Pressure(mmHg): 120/73 Body Mass Index(BMI): 45 Temperature(F): 98.7 Respiratory Rate(breaths/min): 18 [5:Photos:] [name], [name] ([id number]) [5:Photos:] [[id number] Page 4 of 9 N/A] Right Calcaneus Sacrum N/A Wound Location: Pressure Injury Pressure Injury N/A Wounding Event: Pressure Ulcer Pressure Ulcer N/A Primary Etiology: Anemia, Sleep Apnea, Coronary Artery Anemia, Sleep Apnea, Coronary Artery N/A Comorbid History: Disease, Deep Vein Thrombosis, Disease, Deep Vein Thrombosis, Hypertension, Type II Diabetes, Hypertension, Type II Diabetes, History of pressure wounds, History of pressure wounds, Neuropathy Neuropathy 07/01/2021 07/01/2021 N/A Date Acquired: 35 62 N/A Weeks of Treatment: Open Open N/A Wound Status: No No N/A Wound Recurrence: 2.2x2.5x0.3 2x2x3.6 N/A Measurements L x W x D (cm) 4.32 3.142 N/A A (cm) : rea 1.296 11.31 N/A Volume (cm) : -4.40% 0.00% N/A % Reduction in A rea: -213.00% -20.00% N/A % Reduction in Volume: Category/Stage III Category/Stage IV N/A Classification: Medium Medium N/A Exudate A mount: Serosanguineous Serosanguineous N/A Exudate Type: red, brown red, brown N/A Exudate Color: Medium (34-66%) Large  (67-100%) N/A Granulation A mount: Red Red N/A Granulation Quality: Medium (34-66%) None Present (0%) N/A Necrotic A mount: Fascia: No Fat Layer (Subcutaneous Tissue): Yes N/A Exposed Structures: Fat Layer (Subcutaneous Tissue): No Fascia: No Tendon: No Tendon: No Muscle: No Muscle: No Joint: No Joint: No Bone: No Bone: No Large (67-100%) None N/A Epithelialization: Treatment Notes Electronic Signature(s) Signed: 02/04/2023 3:47:31 PM By: [name] RN Entered By: [name] on 02/04/2023 09:12:03 -------------------------------------------------------------------------------- Multi-Disciplinary Care Plan Details Patient Name: Date of Service: [name] [facility] 02/04/2023 8:45 A M Medical Record Number: [id number] Patient Account Number: [id number] Date of Birth/Sex: Treating RN: [birth year] (66 y.o. [name]) [name] Primary Care : Dione Booze Other Clinician: Referring : Treating /Extender: [name] in Treatment: 53 Active Inactive Wound/Skin  Impairment Nursing Diagnoses: Knowledge deficit related to ulceration/compromised skin integrity [name], [name] ([id number]) [phone].pdf Page 5 of 9 Goals: Patient/caregiver will verbalize understanding of skin care regimen Date Initiated: 11/22/2021 Target Resolution Date: 02/23/2023 Goal Status: Active Ulcer/skin breakdown will have a volume reduction of 30% by week 4 Date Initiated: 11/22/2021 Date Inactivated: 01/10/2022 Target Resolution Date: 12/23/2021 Goal Status: Unmet Unmet Reason: comorbities Ulcer/skin breakdown will have a volume reduction of 50% by week 8 Date Initiated: 11/22/2021 Date Inactivated: 05/07/2022 Target Resolution Date: 01/22/2022 Goal Status: Unmet Unmet Reason: comorbidities Ulcer/skin breakdown will have a volume reduction of 80% by week 12 Date Initiated: 11/22/2021 Date Inactivated: 05/07/2022 Target  Resolution Date: 02/22/2022 Goal Status: Unmet Unmet Reason: comorbidities Ulcer/skin breakdown will heal within 14 weeks Date Initiated: 11/22/2021 Date Inactivated: 05/07/2022 Target Resolution Date: 03/25/2022 Goal Status: Unmet Unmet Reason: comorbidities Interventions: Assess patient/caregiver ability to obtain necessary supplies Assess patient/caregiver ability to perform ulcer/skin care regimen upon admission and as needed Assess ulceration(s) every visit Notes: Electronic Signature(s) Signed: 02/04/2023 3:47:31 PM By: [name] RN Entered By: [name] on 02/04/2023 09:12:31 -------------------------------------------------------------------------------- Pain Assessment Details Patient Name: Date of Service: [name] [facility] 02/04/2023 8:45 A M Medical Record Number: [id number] Patient Account Number: [id number] Date of Birth/Sex: Treating RN: 10-14-1955 (66 y.o. [name] Primary Care : Dione Booze Other Clinician: Referring : Treating /Extender: [name] in Treatment: 62 Active Problems Location of Pain Severity and Description of Pain Patient Has Paino No Site Locations Pain Management and Medication [name], [name] ([id number]) [id number] Page 6 of 9 Current Pain Management: Electronic Signature(s) Signed: 02/04/2023 3:47:31 PM By: [name] RN Entered By: [name] on 02/04/2023 09:10:20 -------------------------------------------------------------------------------- Patient/Caregiver Education Details Patient Name: Date of Service: [name] [facility] 8/6/2024andnbsp8:45 A M Medical Record Number: [id number] Patient Account Number: [id number] Date of Birth/Gender: Treating RN: Nov 19, 1955 (66 y.o. [name]) [name] Primary Care Physician: Dione Booze Other Clinician: Referring Physician: Treating Physician/Extender: [name] in Treatment: 92 Education Assessment Education Provided To: Patient Education Topics Provided Wound/Skin Impairment: Handouts: Caring for Your Ulcer Methods: Explain/Verbal Responses: State content correctly Electronic Signature(s) Signed: 02/04/2023 3:47:31 PM By: [name] RN Entered By: [name] on 02/04/2023 09:12:46 -------------------------------------------------------------------------------- Wound Assessment Details Patient Name: Date of Service: [name] [facility] 02/04/2023 8:45 A M Medical Record Number: [id number] Patient Account Number: [id number] Date of Birth/Sex: Treating RN: August 19, 1955 (66 y.o. [name] Primary Care : Dione Booze Other Clinician: Referring : Treating /Extender: [name] in Treatment: 62 Wound Status Wound Number: 5 Primary Pressure Ulcer Etiology: Wound Location: Right Calcaneus Wound Open Mateja, [name] ([id number]) [phone].pdf Page 7 of 9 Wound Open Wounding Event: Pressure Injury Status: Date Acquired: 07/01/2021 Comorbid Anemia, Sleep Apnea, Coronary Artery Disease, Deep Vein Weeks Of Treatment: 62 History: Thrombosis, Hypertension, Type II Diabetes, History of pressure Clustered Wound: No wounds, Neuropathy Photos Wound Measurements Length: (cm) 2.2 Width: (cm) 2.5 Depth: (cm) 0.3 Area: (cm) 4.32 Volume: (cm) 1.296 % Reduction in Area: -4.4% % Reduction in Volume: -213% Epithelialization: Large (67-100%) Tunneling: No Undermining: No Wound Description Classification: Category/Stage III Exudate Amount: Medium Exudate Type: Serosanguineous Exudate Color: red, brown Foul Odor After Cleansing: No Slough/Fibrino Yes Wound Bed Granulation Amount: Medium (34-66%) Exposed Structure Granulation Quality: Red Fascia Exposed: No Necrotic Amount: Medium (34-66%) Fat Layer (Subcutaneous Tissue) Exposed: No Necrotic  Quality: Adherent Slough Tendon Exposed: No Muscle Exposed: No Joint Exposed: No Bone Exposed: No Treatment Notes  Wound #5 (Calcaneus) Wound Laterality: Right Cleanser Normal Saline Discharge Instruction: Wash your hands with soap and water. Remove old dressing, discard into plastic bag and place into trash. Cleanse the wound with Normal Saline prior to applying a clean dressing using gauze sponges, not tissues or cotton balls. Do not scrub or use excessive force. Pat dry using gauze sponges, not tissue or cotton balls. Soap and Water Discharge Instruction: Gently cleanse wound with antibacterial soap, rinse and pat dry prior to dressing wounds Peri-Wound Care Topical Primary Dressing Hydrofera Blue Ready Transfer Foam, 2.5x2.5 (in/in) Discharge Instruction: cut to size of wound Secondary Dressing Gauze Discharge Instruction: As directed: dry, moistened with saline or moistened with Dakins Solution Secured With Medipore T - 66M Medipore H Soft Cloth Surgical T ape ape, 2x2 (in/yd) Compression Wrap Compression Stockings Add-Ons Electronic Signature(s) [name], [name] ([id number]) [id number] Page 8 of 9 Signed: 02/04/2023 3:47:31 PM By: [name] RN Entered By: [name] on 02/04/2023 09:11:12 -------------------------------------------------------------------------------- Wound Assessment Details Patient Name: Date of Service: [name] [facility] 02/04/2023 8:45 A M Medical Record Number: [id number] Patient Account Number: [id number] Date of Birth/Sex: Treating RN: 01-Oct-1955 (66 y.o. [name]) [name] Primary Care : Dione Booze Other Clinician: Referring : Treating /Extender: [name] in Treatment: 62 Wound Status Wound Number: 7 Primary Pressure Ulcer Etiology: Wound Location: Sacrum Wound Open Wounding Event: Pressure Injury Status: Date Acquired: 07/01/2021 Comorbid Anemia,  Sleep Apnea, Coronary Artery Disease, Deep Vein Weeks Of Treatment: 62 History: Thrombosis, Hypertension, Type II Diabetes, History of pressure Clustered Wound: No wounds, Neuropathy Photos Wound Measurements Length: (cm) 2 Width: (cm) 2 Depth: (cm) 3.6 Area: (cm) 3.142 Volume: (cm) 11.31 % Reduction in Area: 0% % Reduction in Volume: -20% Epithelialization: None Tunneling: No Undermining: No Wound Description Classification: Category/Stage IV Exudate Amount: Medium Exudate Type: Serosanguineous Exudate Color: red, brown Foul Odor After Cleansing: No Slough/Fibrino Yes Wound Bed Granulation Amount: Large (67-100%) Exposed Structure Granulation Quality: Red Fascia Exposed: No Necrotic Amount: None Present (0%) Fat Layer (Subcutaneous Tissue) Exposed: Yes Tendon Exposed: No Muscle Exposed: No Joint Exposed: No Bone Exposed: No Treatment Notes Wound #7 (Sacrum) [name], [name] ([id number]) [id number] Page 9 of 9 Cleanser Normal Saline Discharge Instruction: Wash your hands with soap and water. Remove old dressing, discard into plastic bag and place into trash. Cleanse the wound with Normal Saline prior to applying a clean dressing using gauze sponges, not tissues or cotton balls. Do not scrub or use excessive force. Pat dry using gauze sponges, not tissue or cotton balls. Soap and Water Discharge Instruction: Gently cleanse wound with antibacterial soap, rinse and pat dry prior to dressing wounds Peri-Wound Care Topical calmoseptine Discharge Instruction: apply to excoriated areas / periwound Primary Dressing Hydrofera Blue Ready Transfer Foam, 4x5 (in/in) Discharge Instruction: Apply Hydrofera Blue Ready to wound bed as directed Secondary Dressing ABD Pad 5x9 (in/in) Discharge Instruction: Cover with ABD pad Secured With Medipore T - 66M Medipore H Soft Cloth Surgical T ape ape, 2x2 (in/yd) Compression Wrap Compression  Stockings Add-Ons Electronic Signature(s) Signed: 02/04/2023 3:47:31 PM By: [name] RN Entered By: [name] on 02/04/2023 09:11:34 -------------------------------------------------------------------------------- Vitals Details Patient Name: Date of Service: [name] [facility] 02/04/2023 8:45 A M Medical Record Number: [id number] Patient Account Number: [id number] Date of Birth/Sex: Treating RN: Sep 26, 1955 (66 y.o. [name] Primary Care : Dione Booze Other Clinician: Referring : Treating /Extender: [name] in Treatment: 62 Vital Signs Time Taken: 08:52 Temperature (F):  98.7 Height (in): 66 Pulse (bpm): 73 Weight (lbs): 279 Respiratory Rate (breaths/min): 18 Body Mass Index (BMI): 45 Blood Pressure (mmHg): 120/73 Reference Range: 80 - 120 mg / dl Electronic Signature(s) Signed: 02/04/2023 3:47:31 PM By: [name] RN Entered By: [name] on 02/04/2023 09:10:09

## 2023-02-11 ENCOUNTER — Other Ambulatory Visit: Payer: Self-pay | Admitting: Psychiatry

## 2023-02-11 DIAGNOSIS — F411 Generalized anxiety disorder: Secondary | ICD-10-CM

## 2023-02-11 DIAGNOSIS — G4701 Insomnia due to medical condition: Secondary | ICD-10-CM

## 2023-02-11 DIAGNOSIS — F3178 Bipolar disorder, in full remission, most recent episode mixed: Secondary | ICD-10-CM

## 2023-02-12 ENCOUNTER — Ambulatory Visit: Payer: Medicare Other | Admitting: Urology

## 2023-02-12 ENCOUNTER — Ambulatory Visit (INDEPENDENT_AMBULATORY_CARE_PROVIDER_SITE_OTHER): Payer: Medicare Other | Admitting: Urology

## 2023-02-12 ENCOUNTER — Encounter: Payer: Self-pay | Admitting: Urology

## 2023-02-12 VITALS — BP 134/80 | HR 81 | Ht 66.0 in | Wt 262.0 lb

## 2023-02-12 DIAGNOSIS — R310 Gross hematuria: Secondary | ICD-10-CM

## 2023-02-12 DIAGNOSIS — N319 Neuromuscular dysfunction of bladder, unspecified: Secondary | ICD-10-CM

## 2023-02-12 NOTE — Progress Notes (Signed)
   02/12/2023 11:22 AM   [name] [birth year] [id number]  Reason for visit: Follow up neurogenic bladder, gross hematuria  HPI: Comorbid 67 year old male here with his wife today who again provides most of the history.  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  During hospitalization in March 2024 he was noted to have some gross hematuria and CT showed a malpositioned Foley catheter as well as unilateral right-sided hydronephrosis and a possible bladder mass.  He ultimately underwent a normal cystoscopy and normal right retrograde pyelogram with me in April 2024 with no abnormal findings.  Gross hematuria suspected to be from intermittent Foley catheter trauma.  He was seen in the ER in May 2024 for altered mental status can I reviewed those notes as well as the imaging.  I personally viewed and interpreted the CT abdomen and pelvis at that time that shows no hydronephrosis or abnormal urologic findings.  It has been difficult to ascertain if infections have been UTIs or chronic colonization's from his chronic Foley, versus infections with his deep decubitus ulcer and chronic osteomyelitis.  Continue monthly Foley changes with home health Yearly urology follow-up   Sondra Come, MD  [facility] [address], [address] [place], [place] [address] [phone]

## 2023-02-22 ENCOUNTER — Other Ambulatory Visit: Payer: Self-pay | Admitting: Psychiatry

## 2023-02-22 DIAGNOSIS — F3178 Bipolar disorder, in full remission, most recent episode mixed: Secondary | ICD-10-CM

## 2023-02-22 DIAGNOSIS — G4701 Insomnia due to medical condition: Secondary | ICD-10-CM

## 2023-02-22 DIAGNOSIS — F411 Generalized anxiety disorder: Secondary | ICD-10-CM

## 2023-02-25 ENCOUNTER — Encounter: Payer: Medicare Other | Admitting: Internal Medicine

## 2023-02-25 DIAGNOSIS — T83511A Infection and inflammatory reaction due to indwelling urethral catheter, initial encounter: Secondary | ICD-10-CM | POA: Diagnosis not present

## 2023-02-25 DIAGNOSIS — A419 Sepsis, unspecified organism: Secondary | ICD-10-CM | POA: Diagnosis not present

## 2023-02-26 ENCOUNTER — Emergency Department: Payer: Medicare Other

## 2023-02-26 ENCOUNTER — Other Ambulatory Visit: Payer: Self-pay

## 2023-02-26 ENCOUNTER — Inpatient Hospital Stay
Admission: EM | Admit: 2023-02-26 | Discharge: 2023-03-05 | DRG: 698 | Disposition: A | Discharge status: Skilled Nursing Facility | Payer: Medicare Other | Attending: Internal Medicine | Admitting: Internal Medicine

## 2023-02-26 DIAGNOSIS — L89154 Pressure ulcer of sacral region, stage 4: Secondary | ICD-10-CM | POA: Diagnosis present

## 2023-02-26 DIAGNOSIS — N183 Chronic kidney disease, stage 3 unspecified: Secondary | ICD-10-CM | POA: Insufficient documentation

## 2023-02-26 DIAGNOSIS — B965 Pseudomonas (aeruginosa) (mallei) (pseudomallei) as the cause of diseases classified elsewhere: Secondary | ICD-10-CM | POA: Diagnosis present

## 2023-02-26 DIAGNOSIS — T8383XA Hemorrhage of genitourinary prosthetic devices, implants and grafts, initial encounter: Secondary | ICD-10-CM | POA: Diagnosis present

## 2023-02-26 DIAGNOSIS — E871 Hypo-osmolality and hyponatremia: Secondary | ICD-10-CM | POA: Diagnosis present

## 2023-02-26 DIAGNOSIS — D631 Anemia in chronic kidney disease: Secondary | ICD-10-CM | POA: Diagnosis present

## 2023-02-26 DIAGNOSIS — Z466 Encounter for fitting and adjustment of urinary device: Secondary | ICD-10-CM

## 2023-02-26 DIAGNOSIS — I10 Essential (primary) hypertension: Secondary | ICD-10-CM | POA: Diagnosis present

## 2023-02-26 DIAGNOSIS — D638 Anemia in other chronic diseases classified elsewhere: Secondary | ICD-10-CM | POA: Diagnosis present

## 2023-02-26 DIAGNOSIS — Y846 Urinary catheterization as the cause of abnormal reaction of the patient, or of later complication, without mention of misadventure at the time of the procedure: Secondary | ICD-10-CM | POA: Diagnosis present

## 2023-02-26 DIAGNOSIS — T83511A Infection and inflammatory reaction due to indwelling urethral catheter, initial encounter: Secondary | ICD-10-CM | POA: Diagnosis not present

## 2023-02-26 DIAGNOSIS — A419 Sepsis, unspecified organism: Secondary | ICD-10-CM | POA: Diagnosis present

## 2023-02-26 DIAGNOSIS — A4150 Gram-negative sepsis, unspecified: Secondary | ICD-10-CM | POA: Diagnosis present

## 2023-02-26 DIAGNOSIS — F319 Bipolar disorder, unspecified: Secondary | ICD-10-CM | POA: Diagnosis present

## 2023-02-26 DIAGNOSIS — E119 Type 2 diabetes mellitus without complications: Secondary | ICD-10-CM

## 2023-02-26 DIAGNOSIS — N136 Pyonephrosis: Secondary | ICD-10-CM | POA: Diagnosis present

## 2023-02-26 DIAGNOSIS — E1129 Type 2 diabetes mellitus with other diabetic kidney complication: Secondary | ICD-10-CM | POA: Diagnosis present

## 2023-02-26 DIAGNOSIS — F09 Unspecified mental disorder due to known physiological condition: Secondary | ICD-10-CM

## 2023-02-26 DIAGNOSIS — E1122 Type 2 diabetes mellitus with diabetic chronic kidney disease: Secondary | ICD-10-CM | POA: Diagnosis present

## 2023-02-26 DIAGNOSIS — I130 Hypertensive heart and chronic kidney disease with heart failure and stage 1 through stage 4 chronic kidney disease, or unspecified chronic kidney disease: Secondary | ICD-10-CM | POA: Diagnosis present

## 2023-02-26 DIAGNOSIS — I5030 Unspecified diastolic (congestive) heart failure: Secondary | ICD-10-CM | POA: Insufficient documentation

## 2023-02-26 DIAGNOSIS — N390 Urinary tract infection, site not specified: Secondary | ICD-10-CM | POA: Diagnosis present

## 2023-02-26 DIAGNOSIS — F0671 Mild neurocognitive disorder due to known physiological condition with behavioral disturbance: Secondary | ICD-10-CM | POA: Diagnosis present

## 2023-02-26 DIAGNOSIS — Z79899 Other long term (current) drug therapy: Secondary | ICD-10-CM

## 2023-02-26 DIAGNOSIS — X58XXXS Exposure to other specified factors, sequela: Secondary | ICD-10-CM | POA: Diagnosis present

## 2023-02-26 DIAGNOSIS — N319 Neuromuscular dysfunction of bladder, unspecified: Secondary | ICD-10-CM | POA: Diagnosis present

## 2023-02-26 DIAGNOSIS — E872 Acidosis, unspecified: Secondary | ICD-10-CM | POA: Diagnosis present

## 2023-02-26 DIAGNOSIS — G40909 Epilepsy, unspecified, not intractable, without status epilepticus: Secondary | ICD-10-CM | POA: Diagnosis present

## 2023-02-26 DIAGNOSIS — Z7901 Long term (current) use of anticoagulants: Secondary | ICD-10-CM

## 2023-02-26 DIAGNOSIS — E875 Hyperkalemia: Secondary | ICD-10-CM | POA: Diagnosis present

## 2023-02-26 DIAGNOSIS — N189 Chronic kidney disease, unspecified: Secondary | ICD-10-CM

## 2023-02-26 DIAGNOSIS — E876 Hypokalemia: Secondary | ICD-10-CM | POA: Diagnosis not present

## 2023-02-26 DIAGNOSIS — Z1152 Encounter for screening for COVID-19: Secondary | ICD-10-CM

## 2023-02-26 DIAGNOSIS — R6520 Severe sepsis without septic shock: Principal | ICD-10-CM

## 2023-02-26 DIAGNOSIS — L89613 Pressure ulcer of right heel, stage 3: Secondary | ICD-10-CM | POA: Diagnosis present

## 2023-02-26 DIAGNOSIS — E785 Hyperlipidemia, unspecified: Secondary | ICD-10-CM | POA: Diagnosis present

## 2023-02-26 DIAGNOSIS — E669 Obesity, unspecified: Secondary | ICD-10-CM | POA: Diagnosis present

## 2023-02-26 DIAGNOSIS — R7881 Bacteremia: Secondary | ICD-10-CM

## 2023-02-26 DIAGNOSIS — B961 Klebsiella pneumoniae [K. pneumoniae] as the cause of diseases classified elsewhere: Secondary | ICD-10-CM | POA: Diagnosis present

## 2023-02-26 DIAGNOSIS — T83021A Displacement of indwelling urethral catheter, initial encounter: Secondary | ICD-10-CM | POA: Diagnosis present

## 2023-02-26 DIAGNOSIS — Z818 Family history of other mental and behavioral disorders: Secondary | ICD-10-CM

## 2023-02-26 DIAGNOSIS — Y738 Miscellaneous gastroenterology and urology devices associated with adverse incidents, not elsewhere classified: Secondary | ICD-10-CM | POA: Diagnosis present

## 2023-02-26 DIAGNOSIS — R6521 Severe sepsis with septic shock: Secondary | ICD-10-CM | POA: Diagnosis present

## 2023-02-26 DIAGNOSIS — I5032 Chronic diastolic (congestive) heart failure: Secondary | ICD-10-CM | POA: Diagnosis present

## 2023-02-26 DIAGNOSIS — N1832 Chronic kidney disease, stage 3b: Secondary | ICD-10-CM | POA: Diagnosis present

## 2023-02-26 DIAGNOSIS — L89623 Pressure ulcer of left heel, stage 3: Secondary | ICD-10-CM | POA: Diagnosis present

## 2023-02-26 DIAGNOSIS — Z86718 Personal history of other venous thrombosis and embolism: Secondary | ICD-10-CM

## 2023-02-26 DIAGNOSIS — N179 Acute kidney failure, unspecified: Secondary | ICD-10-CM | POA: Diagnosis present

## 2023-02-26 DIAGNOSIS — Z933 Colostomy status: Secondary | ICD-10-CM

## 2023-02-26 DIAGNOSIS — Z2989 Encounter for other specified prophylactic measures: Secondary | ICD-10-CM

## 2023-02-26 DIAGNOSIS — S069XAS Unspecified intracranial injury with loss of consciousness status unknown, sequela: Secondary | ICD-10-CM

## 2023-02-26 DIAGNOSIS — G8220 Paraplegia, unspecified: Secondary | ICD-10-CM | POA: Diagnosis present

## 2023-02-26 DIAGNOSIS — Z7984 Long term (current) use of oral hypoglycemic drugs: Secondary | ICD-10-CM

## 2023-02-26 DIAGNOSIS — G3189 Other specified degenerative diseases of nervous system: Secondary | ICD-10-CM

## 2023-02-26 DIAGNOSIS — Z6841 Body Mass Index (BMI) 40.0 and over, adult: Secondary | ICD-10-CM

## 2023-02-26 MED ORDER — LACTATED RINGERS IV BOLUS (SEPSIS)
1000.0000 mL | Freq: Once | INTRAVENOUS | Status: AC
  Administered 2023-02-27: 1000 mL via INTRAVENOUS

## 2023-02-26 NOTE — ED Provider Notes (Signed)
[facility] Provider Note    Event Date/Time   First MD Initiated Contact with Patient 02/26/23 2332     (approximate)   History   Hematuria and Hematemesis   HPI [name] is a 67 y.o. male with extremely extensive past medical history including prior colostomy, multiple episodes of cellulitis of various parts of his body, and chronic indwelling Foley catheter.  He reportedly also has a history of traumatic brain injury.  He presents from home by EMS for shaking and vomiting.  History from the family was extremely minimal; the paramedics asked the family what his medical history is, and they just stated "diabetes".  The patient says he is having pain in his groin.  The family changed his chronic indwelling Foley catheter around 2 PM but it is unclear if the pain started before or after.  After they changed, he started having bloody output from the catheter into the leg bag.  The patient said he is feeling some chills.  He started vomiting at home and reportedly was vomiting bright red blood.  He says that he drank alcohol in the past but not for many years.  He is not having any chest pain or shortness of breath.  He has some vague lower abdominal pain but it seems to be mostly in his groin.     Physical Exam   ED Triage Vitals  Encounter Vitals Group     BP 02/26/23 2334 137/65     Systolic BP Percentile --      Diastolic BP Percentile --      Pulse Rate 02/26/23 2334 99     Resp 02/26/23 2334 (!) 28     Temp 02/26/23 2334 98.3 F (36.8 C)     Temp Source 02/26/23 2334 Oral     SpO2 02/26/23 2334 99 %     Weight 02/26/23 2335 125.4 kg (276 lb 7.3 oz)     Height 02/26/23 2335 1.676 m (5\' 6" )     Head Circumference --      Peak Flow --      Pain Score --      Pain Loc --      Pain Education --      Exclude from Growth Chart --      Most recent vital signs: Vitals:   02/27/23 0310 02/27/23 0330  BP: 98/81 110/87  Pulse: (!) 101 (!)  106  Resp: 20 20  Temp:    SpO2: 100% 100%    General: Appears chronically ill as well as acutely uncomfortable. CV:  Good peripheral perfusion.  Tachycardia, regular rhythm. Resp:  Normal effort. Speaking easily and comfortably, no accessory muscle usage nor intercostal retractions.  Lungs are clear to auscultation bilaterally. Abd:  Morbid obesity.  No clear tenderness to palpation with no guarding.  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 Other:  Chronic indwelling Foley catheter with sequela indicating that the Foley has been in place for quite a while (urethral erosion).  Gross hematuria in the urine bag.  GCS of 15, patient is awake and alert and answering questions appropriately.   ED Results / Procedures / Treatments   Labs (all labs ordered are listed, but only abnormal results are displayed) Labs Reviewed  LACTIC ACID, PLASMA - Abnormal; Notable for the following components:      Result Value   Lactic Acid, Venous 2.7 (*)    All other components within normal limits  COMPREHENSIVE METABOLIC  PANEL - Abnormal; Notable for the following components:   Sodium 134 (*)    Potassium 5.9 (*)    CO2 19 (*)    Glucose, Bld 176 (*)    BUN 43 (*)    Creatinine, Ser 2.47 (*)    Calcium 8.2 (*)    Total Protein 8.3 (*)    Albumin 3.3 (*)    GFR, Estimated 28 (*)    All other components within normal limits  CBC WITH DIFFERENTIAL/PLATELET - Abnormal; Notable for the following components:   RBC 3.79 (*)    Hemoglobin 10.2 (*)    HCT 33.3 (*)    RDW 18.6 (*)    Platelets 140 (*)    Lymphs Abs 0.5 (*)    Monocytes Absolute 0.0 (*)    All other components within normal limits  PROTIME-INR - Abnormal; Notable for the following components:   Prothrombin Time 16.9 (*)    INR 1.4 (*)    All other components within normal limits  LIPASE, BLOOD - Abnormal; Notable for the following components:   Lipase 54 (*)    All other components within normal limits  URINALYSIS, W/  REFLEX TO CULTURE (INFECTION SUSPECTED) - Abnormal; Notable for the following components:   Color, Urine RED (*)    APPearance CLOUDY (*)    Glucose, UA   (*)    Value: TEST NOT REPORTED DUE TO COLOR INTERFERENCE OF URINE PIGMENT   Hgb urine dipstick   (*)    Value: TEST NOT REPORTED DUE TO COLOR INTERFERENCE OF URINE PIGMENT   Bilirubin Urine   (*)    Value: TEST NOT REPORTED DUE TO COLOR INTERFERENCE OF URINE PIGMENT   Ketones, ur   (*)    Value: TEST NOT REPORTED DUE TO COLOR INTERFERENCE OF URINE PIGMENT   Protein, ur   (*)    Value: TEST NOT REPORTED DUE TO COLOR INTERFERENCE OF URINE PIGMENT   Nitrite   (*)    Value: TEST NOT REPORTED DUE TO COLOR INTERFERENCE OF URINE PIGMENT   Leukocytes,Ua   (*)    Value: TEST NOT REPORTED DUE TO COLOR INTERFERENCE OF URINE PIGMENT   Bacteria, UA MANY (*)    All other components within normal limits  RESP PANEL BY RT-PCR (RSV, FLU A&B, COVID)  [id number]  CULTURE, BLOOD (ROUTINE X 2)  CULTURE, BLOOD (ROUTINE X 2)  URINE CULTURE  LACTIC ACID, PLASMA     EKG  ED ECG REPORT I, Loleta Rose, the attending physician, personally viewed and interpreted this ECG.  Date: 02/26/2023 EKG Time: 23: 40 Rate: 98 Rhythm: normal sinus rhythm QRS Axis: normal Intervals: normal with probable right ventricular hypertrophy ST/T Wave abnormalities: normal Narrative Interpretation: no evidence of acute ischemia    RADIOLOGY See hospital course for details: I viewed and interpreted the patient's chest x-ray and there is no evidence of pneumonia.   PROCEDURES:  Critical Care performed: Yes, see critical care procedure note(s)  .Critical Care  Performed by: Loleta Rose, MD Authorized by: Loleta Rose, MD   Critical care provider statement:    Critical care time (minutes):  45   Critical care time was exclusive of:  Separately billable procedures and treating other patients   Critical care was necessary to treat or prevent imminent or  life-threatening deterioration of the following conditions:  Sepsis   Critical care was time spent personally by me on the following activities:  Development of treatment plan with patient or surrogate, evaluation of  patient's response to treatment, examination of patient, obtaining history from patient or surrogate, ordering and performing treatments and interventions, ordering and review of laboratory studies, ordering and review of radiographic studies, pulse oximetry, re-evaluation of patient's condition and review of old charts .1-3 Lead EKG Interpretation  Performed by: Loleta Rose, MD Authorized by: Loleta Rose, MD     Interpretation: abnormal     ECG rate:  100   ECG rate assessment: tachycardic     Rhythm: sinus tachycardia     Ectopy: none     Conduction: normal       IMPRESSION / MDM / ASSESSMENT AND PLAN / ED COURSE  I reviewed the triage vital signs and the nursing notes.                              Differential diagnosis includes, but is not limited to, sepsis, UTI, pneumonia, acute intra-abdominal infection, placement of indwelling Foley catheter when it was reinserted at home this afternoon.  Patient's presentation is most consistent with acute presentation with potential threat to life or bodily function.  Labs/studies ordered: I ordered standard sepsis labs including the following: COVID-19 PCR swab, blood cultures x2, pro time-INR, CMP, urinalysis, urine culture, lactic acid, CBC with differential, high-sensitivity troponin, lipase.  Also ordered EKG and chest x-ray  Interventions/Medications given:  Medications  sodium zirconium cyclosilicate (LOKELMA) packet 10 g (has no administration in time range)  lactated ringers bolus 1,000 mL (0 mLs Intravenous Stopped 02/27/23 0154)  lactated ringers bolus 1,000 mL (0 mLs Intravenous Stopped 02/27/23 0330)  ceFEPIme (MAXIPIME) 2 g in sodium chloride 0.9 % 100 mL IVPB (0 g Intravenous Stopped 02/27/23 0330)    (Note:   hospital course my include additional interventions and/or labs/studies not listed above.)   Strongly suspect sepsis.  Oral temperature was normal but I have asked the nurses to check a rectal temperature because I believe will be elevated.  Initiating "possible sepsis orders" including 1 L of LR IV bolus until more information is available.  Patient has no severe tenderness to palpation of the abdomen and his genital exam is relatively reassuring with no obvious signs of exercising fasciitis/Fournier's.  However I will likely obtain a CT scan once I have more information back including lab work.  The patient is on the cardiac monitor to evaluate for evidence of arrhythmia and/or significant heart rate changes.   Clinical Course as of 02/27/23 0410  Thu Feb 27, 2023  0015 DG Chest [facility] I viewed and interpreted the patient's chest x-ray and I see no evidence of pneumonia.  I also read the radiologist's report, which confirmed no acute findings. [CF]  0024 Temp(!): 102.8 F (39.3 C) rectal temperature [CF]  0027 Given the patient's fever, overall presentation, numerous possibilities of infection most notably his chronic indwelling Foley catheter, tachycardia, etc., I am proceeding with code sepsis.  I ordered an additional 1 L of LR IV bolus to target a 2 L IV bolus goal based on the patient's ideal body weight.  I also ordered empiric antibiotics for healthcare associated UTI which includes cefepime 2 g IV.  I will broaden the coverage if I obtain additional information indicating an additional source.  Given the patient's pain in his groin, and the possibility of an acute intra-abdominal infection, I will likely proceed with a CT scan of the abdomen and pelvis, but I am waiting to see his renal function before  ordering the scan. [CF]  0101 Lab work notable for lactic acid of 2.7 suggestive of severe sepsis, but treatment has already been initiated.  Lipase is slightly up at 54, unclear if  this is representative of pancreatitis.  Metabolic panel is notable for potassium of 5.9 but a stable BUN and creatinine.  I suspect that the hyperkalemia is more an issue of volume depletion.  Suspect fluid boluses will help and his EKG was reassuring.  No leukocytosis on CBC.  Given the patient's GFR of 28, I ordered a CT of the abdomen pelvis without contrast. [CF]  0217 Sepsis reassessment complete.  Initially there was some concern that the patient's blood pressure has dropped because there was a measurement with a MAP of 45.  However, the nurse checked on the patient and pointed out that the blood pressure cuff had dropped down to not fitting well around the forearm.  His current blood pressure indicates a MAP of 66.  However, I have removed the hospitalist consult for now and we will finish the second liter of fluids and then reassess if the patient is stable for hospitalist without the need for pressors. [CF]  0340 Sepsis reassessment completed.  MAP of 95, mild tachycardia, fluids still going.  I reconsulted the hospitalist service given the patient has been stable for the last hour. [CF]  0351 Consulted with hospitalist service (Dr. Arville Care).  He will admit [CF]    Clinical Course User Index [CF] Loleta Rose, MD     FINAL CLINICAL IMPRESSION(S) / ED DIAGNOSES   Final diagnoses:  Severe sepsis (HCC)  Chronic kidney disease, unspecified CKD stage  Urinary tract infection associated with indwelling urethral catheter, initial encounter [facility])     Rx / DC Orders   ED Discharge Orders     None        Note:  This document was prepared using Dragon voice recognition software and may include unintentional dictation errors.   Loleta Rose, MD 02/27/23 [id number]

## 2023-02-26 NOTE — ED Triage Notes (Signed)
EMS brings patient from home with complaint of hematuria.  Patient had a new catheter placed today at 1400, and family says the blood began after that. Patient complains of groin pain.  Family said that the patient also vomited bright red blood this afternoon.

## 2023-02-27 ENCOUNTER — Emergency Department: Payer: Medicare Other

## 2023-02-27 ENCOUNTER — Encounter: Payer: Self-pay | Admitting: Family Medicine

## 2023-02-27 DIAGNOSIS — T83028D Displacement of other indwelling urethral catheter, subsequent encounter: Secondary | ICD-10-CM | POA: Diagnosis not present

## 2023-02-27 DIAGNOSIS — E872 Acidosis, unspecified: Secondary | ICD-10-CM | POA: Diagnosis present

## 2023-02-27 DIAGNOSIS — I5030 Unspecified diastolic (congestive) heart failure: Secondary | ICD-10-CM | POA: Diagnosis not present

## 2023-02-27 DIAGNOSIS — N136 Pyonephrosis: Secondary | ICD-10-CM | POA: Diagnosis present

## 2023-02-27 DIAGNOSIS — Z978 Presence of other specified devices: Secondary | ICD-10-CM | POA: Diagnosis not present

## 2023-02-27 DIAGNOSIS — B961 Klebsiella pneumoniae [K. pneumoniae] as the cause of diseases classified elsewhere: Secondary | ICD-10-CM | POA: Diagnosis not present

## 2023-02-27 DIAGNOSIS — Z1152 Encounter for screening for COVID-19: Secondary | ICD-10-CM | POA: Diagnosis not present

## 2023-02-27 DIAGNOSIS — Y738 Miscellaneous gastroenterology and urology devices associated with adverse incidents, not elsewhere classified: Secondary | ICD-10-CM | POA: Diagnosis present

## 2023-02-27 DIAGNOSIS — I10 Essential (primary) hypertension: Secondary | ICD-10-CM

## 2023-02-27 DIAGNOSIS — I5032 Chronic diastolic (congestive) heart failure: Secondary | ICD-10-CM | POA: Insufficient documentation

## 2023-02-27 DIAGNOSIS — D638 Anemia in other chronic diseases classified elsewhere: Secondary | ICD-10-CM | POA: Diagnosis not present

## 2023-02-27 DIAGNOSIS — E875 Hyperkalemia: Secondary | ICD-10-CM | POA: Diagnosis not present

## 2023-02-27 DIAGNOSIS — L89154 Pressure ulcer of sacral region, stage 4: Secondary | ICD-10-CM | POA: Diagnosis present

## 2023-02-27 DIAGNOSIS — A4150 Gram-negative sepsis, unspecified: Secondary | ICD-10-CM

## 2023-02-27 DIAGNOSIS — N179 Acute kidney failure, unspecified: Secondary | ICD-10-CM | POA: Diagnosis present

## 2023-02-27 DIAGNOSIS — N390 Urinary tract infection, site not specified: Secondary | ICD-10-CM

## 2023-02-27 DIAGNOSIS — E669 Obesity, unspecified: Secondary | ICD-10-CM | POA: Diagnosis present

## 2023-02-27 DIAGNOSIS — R7881 Bacteremia: Secondary | ICD-10-CM | POA: Diagnosis not present

## 2023-02-27 DIAGNOSIS — G8220 Paraplegia, unspecified: Secondary | ICD-10-CM | POA: Diagnosis present

## 2023-02-27 DIAGNOSIS — N1832 Chronic kidney disease, stage 3b: Secondary | ICD-10-CM | POA: Diagnosis present

## 2023-02-27 DIAGNOSIS — G40909 Epilepsy, unspecified, not intractable, without status epilepticus: Secondary | ICD-10-CM | POA: Diagnosis present

## 2023-02-27 DIAGNOSIS — N319 Neuromuscular dysfunction of bladder, unspecified: Secondary | ICD-10-CM

## 2023-02-27 DIAGNOSIS — Y846 Urinary catheterization as the cause of abnormal reaction of the patient, or of later complication, without mention of misadventure at the time of the procedure: Secondary | ICD-10-CM | POA: Diagnosis present

## 2023-02-27 DIAGNOSIS — E1169 Type 2 diabetes mellitus with other specified complication: Secondary | ICD-10-CM

## 2023-02-27 DIAGNOSIS — L89623 Pressure ulcer of left heel, stage 3: Secondary | ICD-10-CM | POA: Diagnosis present

## 2023-02-27 DIAGNOSIS — L89613 Pressure ulcer of right heel, stage 3: Secondary | ICD-10-CM | POA: Diagnosis present

## 2023-02-27 DIAGNOSIS — F319 Bipolar disorder, unspecified: Secondary | ICD-10-CM

## 2023-02-27 DIAGNOSIS — Z933 Colostomy status: Secondary | ICD-10-CM | POA: Diagnosis not present

## 2023-02-27 DIAGNOSIS — D631 Anemia in chronic kidney disease: Secondary | ICD-10-CM | POA: Diagnosis present

## 2023-02-27 DIAGNOSIS — R6520 Severe sepsis without septic shock: Secondary | ICD-10-CM

## 2023-02-27 DIAGNOSIS — F0671 Mild neurocognitive disorder due to known physiological condition with behavioral disturbance: Secondary | ICD-10-CM | POA: Diagnosis present

## 2023-02-27 DIAGNOSIS — E1122 Type 2 diabetes mellitus with diabetic chronic kidney disease: Secondary | ICD-10-CM | POA: Diagnosis present

## 2023-02-27 DIAGNOSIS — R6521 Severe sepsis with septic shock: Secondary | ICD-10-CM | POA: Diagnosis present

## 2023-02-27 DIAGNOSIS — X58XXXS Exposure to other specified factors, sequela: Secondary | ICD-10-CM | POA: Diagnosis present

## 2023-02-27 DIAGNOSIS — E871 Hypo-osmolality and hyponatremia: Secondary | ICD-10-CM | POA: Diagnosis present

## 2023-02-27 DIAGNOSIS — A419 Sepsis, unspecified organism: Secondary | ICD-10-CM | POA: Diagnosis present

## 2023-02-27 DIAGNOSIS — T83511A Infection and inflammatory reaction due to indwelling urethral catheter, initial encounter: Secondary | ICD-10-CM | POA: Diagnosis present

## 2023-02-27 DIAGNOSIS — N183 Chronic kidney disease, stage 3 unspecified: Secondary | ICD-10-CM | POA: Insufficient documentation

## 2023-02-27 DIAGNOSIS — T8383XA Hemorrhage of genitourinary prosthetic devices, implants and grafts, initial encounter: Secondary | ICD-10-CM | POA: Diagnosis present

## 2023-02-27 DIAGNOSIS — S069X9S Unspecified intracranial injury with loss of consciousness of unspecified duration, sequela: Secondary | ICD-10-CM

## 2023-02-27 DIAGNOSIS — I130 Hypertensive heart and chronic kidney disease with heart failure and stage 1 through stage 4 chronic kidney disease, or unspecified chronic kidney disease: Secondary | ICD-10-CM | POA: Diagnosis present

## 2023-02-27 DIAGNOSIS — Z6841 Body Mass Index (BMI) 40.0 and over, adult: Secondary | ICD-10-CM | POA: Diagnosis not present

## 2023-02-27 DIAGNOSIS — Z515 Encounter for palliative care: Secondary | ICD-10-CM | POA: Diagnosis not present

## 2023-02-27 LAB — CBC WITH DIFFERENTIAL/PLATELET
Abs Immature Granulocytes: 0.02 10*3/uL (ref 0.00–0.07)
Basophils Absolute: 0 10*3/uL (ref 0.0–0.1)
Basophils Relative: 0 %
Eosinophils Absolute: 0.2 10*3/uL (ref 0.0–0.5)
Eosinophils Relative: 3 %
HCT: 33.3 % — ABNORMAL LOW (ref 39.0–52.0)
Hemoglobin: 10.2 g/dL — ABNORMAL LOW (ref 13.0–17.0)
Immature Granulocytes: 0 %
Lymphocytes Relative: 12 %
Lymphs Abs: 0.5 10*3/uL — ABNORMAL LOW (ref 0.7–4.0)
MCH: 26.9 pg (ref 26.0–34.0)
MCHC: 30.6 g/dL (ref 30.0–36.0)
MCV: 87.9 fL (ref 80.0–100.0)
Monocytes Absolute: 0 10*3/uL — ABNORMAL LOW (ref 0.1–1.0)
Monocytes Relative: 0 %
Neutro Abs: 3.9 10*3/uL (ref 1.7–7.7)
Neutrophils Relative %: 85 %
Platelets: 140 10*3/uL — ABNORMAL LOW (ref 150–400)
RBC: 3.79 MIL/uL — ABNORMAL LOW (ref 4.22–5.81)
RDW: 18.6 % — ABNORMAL HIGH (ref 11.5–15.5)
WBC: 4.6 10*3/uL (ref 4.0–10.5)
nRBC: 0 % (ref 0.0–0.2)

## 2023-02-27 LAB — URINALYSIS, W/ REFLEX TO CULTURE (INFECTION SUSPECTED)
RBC / HPF: >50 RBC/hpf (ref 0–5)
Squamous Epithelial / HPF: NONE SEEN /HPF (ref 0–5)
WBC, UA: >50 WBC/hpf (ref 0–5)

## 2023-02-27 LAB — BLOOD CULTURE ID PANEL (REFLEXED) - BCID2

## 2023-02-27 LAB — COMPREHENSIVE METABOLIC PANEL WITH GFR
ALT: 15 U/L (ref 0–44)
AST: 17 U/L (ref 15–41)
Albumin: 3.3 g/dL — ABNORMAL LOW (ref 3.5–5.0)
Alkaline Phosphatase: 104 U/L (ref 38–126)
Anion gap: 8 (ref 5–15)
BUN: 43 mg/dL — ABNORMAL HIGH (ref 8–23)
CO2: 19 mmol/L — ABNORMAL LOW (ref 22–32)
Calcium: 8.2 mg/dL — ABNORMAL LOW (ref 8.9–10.3)
Chloride: 107 mmol/L (ref 98–111)
Creatinine, Ser: 2.47 mg/dL — ABNORMAL HIGH (ref 0.61–1.24)
GFR, Estimated: 28 mL/min — ABNORMAL LOW
Glucose, Bld: 176 mg/dL — ABNORMAL HIGH (ref 70–99)
Potassium: 5.9 mmol/L — ABNORMAL HIGH (ref 3.5–5.1)
Sodium: 134 mmol/L — ABNORMAL LOW (ref 135–145)
Total Bilirubin: 0.6 mg/dL (ref 0.3–1.2)
Total Protein: 8.3 g/dL — ABNORMAL HIGH (ref 6.5–8.1)

## 2023-02-27 LAB — BASIC METABOLIC PANEL
Anion gap: 3 — ABNORMAL LOW (ref 5–15)
BUN: 39 mg/dL — ABNORMAL HIGH (ref 8–23)
CO2: 18 mmol/L — ABNORMAL LOW (ref 22–32)
Calcium: 7.6 mg/dL — ABNORMAL LOW (ref 8.9–10.3)
Chloride: 110 mmol/L (ref 98–111)
Creatinine, Ser: 2.39 mg/dL — ABNORMAL HIGH (ref 0.61–1.24)
GFR, Estimated: 29 mL/min — ABNORMAL LOW (ref >60)
Glucose, Bld: 85 mg/dL (ref 70–99)
Potassium: 5.4 mmol/L — ABNORMAL HIGH (ref 3.5–5.1)
Sodium: 131 mmol/L — ABNORMAL LOW (ref 135–145)

## 2023-02-27 LAB — MRSA NEXT GEN BY PCR, NASAL: MRSA by PCR Next Gen: NOT DETECTED

## 2023-02-27 LAB — RESP PANEL BY RT-PCR (RSV, FLU A&B, COVID)  RVPGX2
Influenza A by PCR: NEGATIVE
Influenza B by PCR: NEGATIVE
Resp Syncytial Virus by PCR: NEGATIVE
SARS Coronavirus 2 by RT PCR: NEGATIVE

## 2023-02-27 LAB — PROTIME-INR
INR: 1.4 — ABNORMAL HIGH (ref 0.8–1.2)
Prothrombin Time: 16.9 s — ABNORMAL HIGH (ref 11.4–15.2)

## 2023-02-27 LAB — LACTIC ACID, PLASMA
Lactic Acid, Venous: 1.5 mmol/L (ref 0.5–1.9)
Lactic Acid, Venous: 1.6 mmol/L (ref 0.5–1.9)
Lactic Acid, Venous: 2.7 mmol/L (ref 0.5–1.9)
Lactic Acid, Venous: 2.9 mmol/L (ref 0.5–1.9)

## 2023-02-27 LAB — PROCALCITONIN: Procalcitonin: 0.32 ng/mL

## 2023-02-27 LAB — LIPASE, BLOOD: Lipase: 54 U/L — ABNORMAL HIGH (ref 11–51)

## 2023-02-27 LAB — GLUCOSE, CAPILLARY
Glucose-Capillary: 84 mg/dL (ref 70–99)
Glucose-Capillary: 86 mg/dL (ref 70–99)
Glucose-Capillary: 122 mg/dL — ABNORMAL HIGH (ref 70–99)
Glucose-Capillary: 83 mg/dL (ref 70–99)

## 2023-02-27 LAB — POTASSIUM: Potassium: 4.6 mmol/L (ref 3.5–5.1)

## 2023-02-27 MED ORDER — LACTATED RINGERS IV BOLUS
1000.0000 mL | Freq: Once | INTRAVENOUS | Status: AC
  Administered 2023-02-27: 1000 mL via INTRAVENOUS

## 2023-02-27 MED ORDER — LACTATED RINGERS IV BOLUS (SEPSIS)
1000.0000 mL | Freq: Once | INTRAVENOUS | Status: AC
  Administered 2023-02-27: 1000 mL via INTRAVENOUS

## 2023-02-27 MED ORDER — ONDANSETRON HCL 4 MG PO TABS: 4.0000 mg | ORAL_TABLET | Freq: Four times a day (QID) | ORAL | Status: DC | PRN

## 2023-02-27 MED ORDER — ONDANSETRON HCL 4 MG/2ML IJ SOLN: 4.0000 mg | Freq: Four times a day (QID) | INTRAMUSCULAR | Status: DC | PRN

## 2023-02-27 MED ORDER — APIXABAN 5 MG PO TABS
5.0000 mg | ORAL_TABLET | Freq: Two times a day (BID) | ORAL | Status: DC
  Administered 2023-02-27: 5 mg via ORAL
  Filled 2023-02-27: qty 1

## 2023-02-27 MED ORDER — QUETIAPINE FUMARATE 25 MG PO TABS: 25.0000 mg | ORAL_TABLET | Freq: Every evening | ORAL | Status: DC | PRN

## 2023-02-27 MED ORDER — SODIUM CHLORIDE 0.9 % IV SOLN
2.0000 g | Freq: Two times a day (BID) | INTRAVENOUS | Status: DC
  Administered 2023-02-27 – 2023-02-28 (×2): 2 g via INTRAVENOUS
  Filled 2023-02-27 (×3): qty 12.5

## 2023-02-27 MED ORDER — ACETAMINOPHEN 500 MG PO TABS
1000.0000 mg | ORAL_TABLET | Freq: Once | ORAL | Status: AC
  Administered 2023-02-27: 1000 mg via ORAL
  Filled 2023-02-27: qty 2

## 2023-02-27 MED ORDER — ORAL CARE MOUTH RINSE: 15.0000 mL | OROMUCOSAL | Status: DC | PRN

## 2023-02-27 MED ORDER — INSULIN ASPART 100 UNIT/ML IJ SOLN
0.0000 [IU] | Freq: Three times a day (TID) | INTRAMUSCULAR | Status: DC
  Administered 2023-02-27 – 2023-03-05 (×6): 1 [IU] via SUBCUTANEOUS
  Filled 2023-02-27 (×9): qty 1

## 2023-02-27 MED ORDER — CHLORHEXIDINE GLUCONATE CLOTH 2 % EX PADS
6.0000 | MEDICATED_PAD | Freq: Every day | CUTANEOUS | Status: DC
  Administered 2023-02-27 – 2023-03-05 (×5): 6 via TOPICAL

## 2023-02-27 MED ORDER — ROSUVASTATIN CALCIUM 10 MG PO TABS
20.0000 mg | ORAL_TABLET | Freq: Every day | ORAL | Status: DC
  Administered 2023-02-27 – 2023-03-05 (×7): 20 mg via ORAL
  Filled 2023-02-27 (×3): qty 2
  Filled 2023-02-27: qty 1
  Filled 2023-02-27 (×4): qty 2

## 2023-02-27 MED ORDER — SODIUM BICARBONATE 650 MG PO TABS
650.0000 mg | ORAL_TABLET | Freq: Two times a day (BID) | ORAL | Status: DC
  Administered 2023-02-27 – 2023-03-05 (×13): 650 mg via ORAL
  Filled 2023-02-27 (×15): qty 1

## 2023-02-27 MED ORDER — INSULIN ASPART 100 UNIT/ML IJ SOLN
3.0000 [IU] | Freq: Three times a day (TID) | INTRAMUSCULAR | Status: DC
  Administered 2023-02-27 – 2023-03-02 (×6): 3 [IU] via SUBCUTANEOUS
  Filled 2023-02-27 (×6): qty 1

## 2023-02-27 MED ORDER — SODIUM ZIRCONIUM CYCLOSILICATE 10 G PO PACK
10.0000 g | PACK | ORAL | Status: AC
  Administered 2023-02-27: 10 g via ORAL
  Filled 2023-02-27: qty 1

## 2023-02-27 MED ORDER — MEMANTINE HCL 5 MG PO TABS
10.0000 mg | ORAL_TABLET | Freq: Two times a day (BID) | ORAL | Status: DC
  Administered 2023-02-27 – 2023-03-05 (×13): 10 mg via ORAL
  Filled 2023-02-27 (×5): qty 2
  Filled 2023-02-27: qty 1
  Filled 2023-02-27 (×3): qty 2
  Filled 2023-02-27 (×2): qty 1
  Filled 2023-02-27 (×2): qty 2

## 2023-02-27 MED ORDER — LACTATED RINGERS IV SOLN
150.0000 mL/h | INTRAVENOUS | Status: AC
  Administered 2023-02-27 (×2): 150 mL/h via INTRAVENOUS

## 2023-02-27 MED ORDER — SODIUM CHLORIDE 0.9 % IV SOLN
2.0000 g | Freq: Once | INTRAVENOUS | Status: AC
  Administered 2023-02-27: 2 g via INTRAVENOUS
  Filled 2023-02-27: qty 12.5

## 2023-02-27 MED ORDER — OXCARBAZEPINE 300 MG PO TABS
300.0000 mg | ORAL_TABLET | Freq: Two times a day (BID) | ORAL | Status: DC
  Administered 2023-02-27 – 2023-03-05 (×13): 300 mg via ORAL
  Filled 2023-02-27 (×14): qty 1

## 2023-02-27 NOTE — Assessment & Plan Note (Signed)
SSI A1c 

## 2023-02-27 NOTE — Assessment & Plan Note (Signed)
HOLD BP regimen in setting of sepsis and hypotension

## 2023-02-27 NOTE — Assessment & Plan Note (Signed)
Baseline creatinine around 2-2.6 with GFR in the upper 20s to 30s Creatinine 2.47 today with GFR in the upper 20s Monitor Minimize nephrotoxic agents Restart oral bicarb in the setting of concomitant metabolic acidosis

## 2023-02-27 NOTE — Progress Notes (Signed)
PHARMACY -  BRIEF ANTIBIOTIC NOTE   Pharmacy has received consult(s) for Cefepime from an ED provider.  The patient's profile has been reviewed for ht/wt/allergies/indication/available labs.    One time order(s) placed for Cefepime 2 gm IV X 1  Further antibiotics/pharmacy consults should be ordered by admitting physician if indicated.                       Thank you, Kyriakos Babler D 02/27/2023  12:38 AM

## 2023-02-27 NOTE — ED Notes (Signed)
ED TO INPATIENT HANDOFF REPORT  ED Nurse Name and Phone #: Delice Bison, RN  S Name/Age/Gender [name] 67 y.o. male Room/Bed: ED05A/ED05A  Code Status   Code Status: Full Code  Home/SNF/Other Home Patient oriented to: self, place, time, and situation Is this baseline? Yes   Triage Complete: Triage complete  Chief Complaint Sepsis due to gram-negative UTI (HCC) [A41.50, N39.0] Sepsis (HCC) [A41.9]  Triage Note EMS brings patient from home with complaint of hematuria.  Patient had a new catheter placed today at 1400, and family says the blood began after that. Patient complains of groin pain.  Family said that the patient also vomited bright red blood this afternoon.   Allergies No Known Allergies  Level of Care/Admitting Diagnosis ED Disposition     ED Disposition  Admit   Condition  --   Comment  Hospital Area: [facility] [[id number]]  Level of Care: Stepdown [14]  Covid Evaluation: Confirmed COVID Negative  Diagnosis: Sepsis [facility]) [[id number]]  Admitting Physician: Floydene Flock [[id number]]  Attending Physician: Floydene Flock [phone]  Certification:: I certify this patient will need inpatient services for at least 2 midnights          B Medical/Surgery History Past Medical History:  Diagnosis Date   Acute exacerbation of CHF (congestive heart failure) (HCC)    Anemia    Bipolar disorder (HCC)    Bladder mass    Cellulitis    Chronic anticoagulation    Chronic indwelling Foley catheter    CKD (chronic kidney disease) stage 3, GFR 30-59 ml/min (HCC)    Colostomy in place (HCC)    Diabetes mellitus without complication (HCC)    Frontotemporal dementia (HCC)    Heel ulcer (HCC)    Left   Hematuria    History of blood clots    Hydronephrosis of right kidney    Hyperkalemia    Hypertension    Hyponatremia    Lithium toxicity    Metabolic acidosis    Neurogenic bladder    Obesity    Sacral decubitus ulcer    Sepsis (HCC)     TBI (traumatic brain injury) (HCC)    UTI (urinary tract infection)    VRE (vancomycin resistant enterococcus) culture positive    Past Surgical History:  Procedure Laterality Date   BACK SURGERY     CARPAL TUNNEL RELEASE Bilateral    COLON SURGERY     COLOSTOMY     CYSTOSCOPY W/ RETROGRADES Right 10/25/2022   Procedure: CYSTOSCOPY WITH RETROGRADE PYELOGRAM;  Surgeon: Sondra Come, MD;  Location: [facility] ORS;  Service: Urology;  Laterality: Right;   IR PERC TUN PERIT CATH WO PORT S&I /IMAG  10/11/2021   IR REMOVAL TUN CV CATH W/O FL  11/19/2021   IR US GUIDE VASC ACCESS RIGHT  10/11/2021   SACRAL DECUBITUS ULCER EXCISION     TONSILLECTOMY       A IV Location/Drains/Wounds Patient Lines/Drains/Airways Status     Active Line/Drains/Airways     Name Placement date Placement time Site Days   Peripheral IV 02/26/23 18 G Left Antecubital 02/26/23  2335  Antecubital  1   Colostomy LLQ 01/17/20  0924  LLQ  1137   Urethral Catheter Dorian, RN Straight-tip;Latex 16 Fr. 11/26/22  0651  Straight-tip;Latex  93   Pressure Injury 02/23/22 Buttocks Bilateral Unstageable - Full thickness tissue loss in which the base of the injury is covered by slough (yellow, tan, gray, green or brown) and/or eschar (tan,  brown or black) in the wound bed. Reddened white area around 02/23/22  1515  -- 369   Pressure Injury Heel Right --  --  -- --            Intake/Output Last 24 hours No intake or output data in the 24 hours ending 02/27/23 0912  Labs/Imaging Results for orders placed or performed during the hospital encounter of 02/26/23 (from the past 48 hour(s))  Lactic acid, plasma     Status: Abnormal   Collection Time: 02/27/23 12:06 AM  Result Value Ref Range   Lactic Acid, Venous 2.7 (HH) 0.5 - 1.9 mmol/L    Comment: CRITICAL RESULT CALLED TO, READ BACK BY AND VERIFIED WITH Mosetta Putt @0046  on 02/27/23 skl Performed at [facility], [address] [place], [place]  [address]   Comprehensive metabolic panel     Status: Abnormal   Collection Time: 02/27/23 12:06 AM  Result Value Ref Range   Sodium 134 (L) 135 - 145 mmol/L   Potassium 5.9 (H) 3.5 - 5.1 mmol/L   Chloride 107 98 - 111 mmol/L   CO2 19 (L) 22 - 32 mmol/L   Glucose, Bld 176 (H) 70 - 99 mg/dL    Comment: Glucose reference range applies only to samples taken after fasting for at least 8 hours.   BUN 43 (H) 8 - 23 mg/dL   Creatinine, Ser 4.74 (H) 0.61 - 1.24 mg/dL   Calcium 8.2 (L) 8.9 - 10.3 mg/dL   Total Protein 8.3 (H) 6.5 - 8.1 g/dL   Albumin 3.3 (L) 3.5 - 5.0 g/dL   AST 17 15 - 41 U/L   ALT 15 0 - 44 U/L   Alkaline Phosphatase 104 38 - 126 U/L   Total Bilirubin 0.6 0.3 - 1.2 mg/dL   GFR, Estimated 28 (L) >60 mL/min    Comment: (NOTE) Calculated using the CKD-EPI Creatinine Equation (2021)    Anion gap 8 5 - 15    Comment: Performed at [facility], [address] [place], [place] [address]  CBC with Differential     Status: Abnormal   Collection Time: 02/27/23 12:06 AM  Result Value Ref Range   WBC 4.6 4.0 - 10.5 K/uL   RBC 3.79 (L) 4.22 - 5.81 MIL/uL   Hemoglobin 10.2 (L) 13.0 - 17.0 g/dL   HCT 38.7 (L) 56.4 - 33.2 %   MCV 87.9 80.0 - 100.0 fL   MCH 26.9 26.0 - 34.0 pg   MCHC 30.6 30.0 - 36.0 g/dL   RDW 95.1 (H) 88.4 - 16.6 %   Platelets 140 (L) 150 - 400 K/uL   nRBC 0.0 0.0 - 0.2 %   Neutrophils Relative % 85 %   Neutro Abs 3.9 1.7 - 7.7 K/uL   Lymphocytes Relative 12 %   Lymphs Abs 0.5 (L) 0.7 - 4.0 K/uL   Monocytes Relative 0 %   Monocytes Absolute 0.0 (L) 0.1 - 1.0 K/uL   Eosinophils Relative 3 %   Eosinophils Absolute 0.2 0.0 - 0.5 K/uL   Basophils Relative 0 %   Basophils Absolute 0.0 0.0 - 0.1 K/uL   Immature Granulocytes 0 %   Abs Immature Granulocytes 0.02 0.00 - 0.07 K/uL    Comment: Performed at [facility], [address] [place], [place] [address]  Protime-INR     Status: Abnormal   Collection Time: 02/27/23 12:06 AM  Result Value  Ref Range   Prothrombin Time 16.9 (H) 11.4 -  15.2 seconds   INR 1.4 (H) 0.8 - 1.2    Comment: (NOTE) INR goal varies based on device and disease states. Performed at [facility], [address] [place], [place] [address]   Blood Culture (routine x 2)     Status: None (Preliminary result)   Collection Time: 02/27/23 12:06 AM   Specimen: BLOOD  Result Value Ref Range   Specimen Description BLOOD BLOOD RIGHT ARM    Special Requests      BOTTLES DRAWN AEROBIC AND ANAEROBIC Blood Culture results may not be optimal due to an excessive volume of blood received in culture bottles   Culture      NO GROWTH < 12 HOURS Performed at [facility], [address] [place], [place] [address]    Report Status PENDING   Lipase, blood     Status: Abnormal   Collection Time: 02/27/23 12:06 AM  Result Value Ref Range   Lipase 54 (H) 11 - 51 U/L    Comment: Performed at [facility], [address] [place], [place] [address]  Urinalysis, w/ Reflex to Culture (Infection Suspected) -Urine, Catheterized; Indwelling urinary catheter     Status: Abnormal   Collection Time: 02/27/23 12:06 AM  Result Value Ref Range   Specimen Source URINE, CATHETERIZED    Color, Urine RED (A) YELLOW   APPearance CLOUDY (A) CLEAR   Specific Gravity, Urine  1.005 - 1.030    TEST NOT REPORTED DUE TO COLOR INTERFERENCE OF URINE PIGMENT   pH  5.0 - 8.0    TEST NOT REPORTED DUE TO COLOR INTERFERENCE OF URINE PIGMENT   Glucose, UA (A) NEGATIVE mg/dL    TEST NOT REPORTED DUE TO COLOR INTERFERENCE OF URINE PIGMENT   Hgb urine dipstick (A) NEGATIVE    TEST NOT REPORTED DUE TO COLOR INTERFERENCE OF URINE PIGMENT   Bilirubin Urine (A) NEGATIVE    TEST NOT REPORTED DUE TO COLOR INTERFERENCE OF URINE PIGMENT   Ketones, ur (A) NEGATIVE mg/dL    TEST NOT REPORTED DUE TO COLOR INTERFERENCE OF URINE PIGMENT   Protein, ur (A) NEGATIVE mg/dL    TEST NOT REPORTED DUE TO COLOR INTERFERENCE OF URINE PIGMENT    Nitrite (A) NEGATIVE    TEST NOT REPORTED DUE TO COLOR INTERFERENCE OF URINE PIGMENT   Leukocytes,Ua (A) NEGATIVE    TEST NOT REPORTED DUE TO COLOR INTERFERENCE OF URINE PIGMENT   RBC / HPF >50 0 - 5 RBC/hpf   WBC, UA >50 0 - 5 WBC/hpf    Comment:        Reflex urine culture not performed if WBC <=10, OR if Squamous epithelial cells >5. If Squamous epithelial cells >5 suggest recollection.    Bacteria, UA MANY (A) NONE SEEN   Squamous Epithelial / HPF NONE SEEN 0 - 5 /HPF   WBC Clumps PRESENT     Comment: Performed at [facility], [address] [place], [place] [address]  Resp panel by RT-PCR (RSV, Flu A&B, Covid) Anterior Nasal Swab     Status: None   Collection Time: 02/27/23 12:06 AM   Specimen: Anterior Nasal Swab  Result Value Ref Range   SARS Coronavirus 2 by RT PCR NEGATIVE NEGATIVE    Comment: (NOTE) SARS-CoV-2 target nucleic acids are NOT DETECTED.  The SARS-CoV-2 RNA is generally detectable in upper respiratory specimens during the acute phase of infection. The lowest concentration of SARS-CoV-2 viral copies this assay can detect is 138 copies/mL. A negative  result does not preclude SARS-Cov-2 infection and should not be used as the sole basis for treatment or other patient management decisions. A negative result may occur with  improper specimen collection/handling, submission of specimen other than nasopharyngeal swab, presence of viral mutation(s) within the areas targeted by this assay, and inadequate number of viral copies(<138 copies/mL). A negative result must be combined with clinical observations, patient history, and epidemiological information. The expected result is Negative.  Fact Sheet for Patients:  BloggerCourse.com  Fact Sheet for Healthcare Providers:  SeriousBroker.it  This test is no t yet approved or cleared by the [place] FDA and  has been authorized for detection and/or  diagnosis of SARS-CoV-2 by FDA under an Emergency Use Authorization (EUA). This EUA will remain  in effect (meaning this test can be used) for the duration of the COVID-19 declaration under Section 564(b)(1) of the Act, 21 U.S.C.section 360bbb-3(b)(1), unless the authorization is terminated  or revoked sooner.       Influenza A by PCR NEGATIVE NEGATIVE   Influenza B by PCR NEGATIVE NEGATIVE    Comment: (NOTE) The Xpert Xpress SARS-CoV-2/FLU/RSV plus assay is intended as an aid in the diagnosis of influenza from Nasopharyngeal swab specimens and should not be used as a sole basis for treatment. Nasal washings and aspirates are unacceptable for Xpert Xpress SARS-CoV-2/FLU/RSV testing.  Fact Sheet for Patients: BloggerCourse.com  Fact Sheet for Healthcare Providers: SeriousBroker.it  This test is not yet approved or cleared by the [place] FDA and has been authorized for detection and/or diagnosis of SARS-CoV-2 by FDA under an Emergency Use Authorization (EUA). This EUA will remain in effect (meaning this test can be used) for the duration of the COVID-19 declaration under Section 564(b)(1) of the Act, 21 U.S.C. section 360bbb-3(b)(1), unless the authorization is terminated or revoked.     Resp Syncytial Virus by PCR NEGATIVE NEGATIVE    Comment: (NOTE) Fact Sheet for Patients: BloggerCourse.com  Fact Sheet for Healthcare Providers: SeriousBroker.it  This test is not yet approved or cleared by the [place] FDA and has been authorized for detection and/or diagnosis of SARS-CoV-2 by FDA under an Emergency Use Authorization (EUA). This EUA will remain in effect (meaning this test can be used) for the duration of the COVID-19 declaration under Section 564(b)(1) of the Act, 21 U.S.C. section 360bbb-3(b)(1), unless the authorization is terminated or revoked.  Performed  at [facility], [address] [place], [place] [address]   Procalcitonin     Status: None   Collection Time: 02/27/23 12:06 AM  Result Value Ref Range   Procalcitonin 0.32 ng/mL    Comment:        Interpretation: PCT (Procalcitonin) <= 0.5 ng/mL: Systemic infection (sepsis) is not likely. Local bacterial infection is possible. (NOTE)       Sepsis PCT Algorithm           Lower Respiratory Tract                                      Infection PCT Algorithm    ----------------------------     ----------------------------         PCT < 0.25 ng/mL                PCT < 0.10 ng/mL          Strongly encourage  Strongly discourage   discontinuation of antibiotics    initiation of antibiotics    ----------------------------     -----------------------------       PCT 0.25 - 0.50 ng/mL            PCT 0.10 - 0.25 ng/mL               OR       >80% decrease in PCT            Discourage initiation of                                            antibiotics      Encourage discontinuation           of antibiotics    ----------------------------     -----------------------------         PCT >= 0.50 ng/mL              PCT 0.26 - 0.50 ng/mL               AND        <80% decrease in PCT             Encourage initiation of                                             antibiotics       Encourage continuation           of antibiotics    ----------------------------     -----------------------------        PCT >= 0.50 ng/mL                  PCT > 0.50 ng/mL               AND         increase in PCT                  Strongly encourage                                      initiation of antibiotics    Strongly encourage escalation           of antibiotics                                     -----------------------------                                           PCT <= 0.25 ng/mL                                                 OR                                        >  80%  decrease in PCT                                      Discontinue / Do not initiate                                             antibiotics  Performed at [facility], [address] [place], [place] [address]   Blood Culture (routine x 2)     Status: None (Preliminary result)   Collection Time: 02/27/23 12:07 AM   Specimen: BLOOD  Result Value Ref Range   Specimen Description BLOOD BLOOD LEFT ARM    Special Requests      BOTTLES DRAWN AEROBIC AND ANAEROBIC Blood Culture results may not be optimal due to an excessive volume of blood received in culture bottles   Culture      NO GROWTH < 12 HOURS Performed at [facility], [address] [place], [place] [address]    Report Status PENDING   Lactic acid, plasma     Status: Abnormal   Collection Time: 02/27/23  4:02 AM  Result Value Ref Range   Lactic Acid, Venous 2.9 (HH) 0.5 - 1.9 mmol/L    Comment: CRITICAL VALUE NOTED. VALUE IS CONSISTENT WITH PREVIOUSLY REPORTED/CALLED VALUE MW Performed at [facility], [address] [place], [place] [address]   Lactic acid, plasma     Status: None   Collection Time: 02/27/23  7:18 AM  Result Value Ref Range   Lactic Acid, Venous 1.6 0.5 - 1.9 mmol/L    Comment: Performed at [facility], [address] [place], [place] [address]   CT ABDOMEN PELVIS WO CONTRAST  Result Date: 02/27/2023 CLINICAL DATA:  Abdominal pain. EXAM: CT ABDOMEN AND PELVIS WITHOUT CONTRAST TECHNIQUE: Multidetector CT imaging of the abdomen and pelvis was performed following the standard protocol without IV contrast. RADIATION DOSE REDUCTION: This exam was performed according to the departmental dose-optimization program which includes automated exposure control, adjustment of the mA and/or kV according to patient size and/or use of iterative reconstruction technique. COMPARISON:  CT abdomen pelvis dated 11/26/2022. FINDINGS: Evaluation of this exam is limited in the absence of  intravenous contrast. Lower chest: The visualized lung bases are clear. There is coronary vascular calcification. No intra-abdominal free air or free fluid. Hepatobiliary: The liver is unremarkable. No biliary dilatation. The gallbladder is unremarkable Pancreas: Unremarkable. No pancreatic ductal dilatation or surrounding inflammatory changes. Spleen: Normal in size without focal abnormality. Adrenals/Urinary Tract: The adrenal glands are unremarkable. There is mild right hydronephrosis. No stone noted. The left kidney is unremarkable. The urinary bladder is partially distended and grossly unremarkable. Air within the bladder introduced via the catheter. The Foley catheter with tip in the region of the prostatic urethra and balloon in the bulbous urethra. Recommend further advancing of the catheter. Stomach/Bowel: Postsurgical changes of the bowel with left anterior loop colostomy. There is no bowel obstruction or active inflammation. The appendix is normal. Vascular/Lymphatic: Moderate aortoiliac atherosclerotic disease. The IVC is unremarkable. No portal venous gas. There is no adenopathy. Reproductive: The prostate and seminal vesicles are grossly unremarkable no pelvic mass. Other: Chronic ulceration of the soft tissues superficial to the coccyx similar to prior CT. No fluid collection. Musculoskeletal: Degenerative changes of  the hips and spine. No acute osseous pathology. IMPRESSION: 1. Mild right hydronephrosis. No stone noted. 2. Malpositioned Foley catheter with tip in the region of the prostatic urethra. Recommend further advancing of the catheter. 3. Postsurgical changes of the bowel with left anterior loop colostomy. No bowel obstruction. Normal appendix. 4.  Aortic Atherosclerosis (ICD10-I70.0). Electronically Signed   By: Elgie Collard M.D.   On: 02/27/2023 01:34   DG Chest Port 1 View  Result Date: 02/27/2023 CLINICAL DATA:  Questionable sepsis. EXAM: PORTABLE CHEST 1 VIEW COMPARISON:  Chest  radiograph dated 11/26/2022. FINDINGS: No focal consolidation, pleural effusion, or pneumothorax. Stable cardiac silhouette. No acute osseous pathology. IMPRESSION: No active disease. Electronically Signed   By: Elgie Collard M.D.   On: 02/27/2023 00:00    Pending Labs Unresulted Labs (From admission, onward)     Start     Ordered   02/28/23 0500  Procalcitonin  Daily,   R     References:    Procalcitonin Lower Respiratory Tract Infection AND Sepsis Procalcitonin Algorithm   02/27/23 0828   02/28/23 0500  CBC  Daily,   R      02/27/23 0828   02/28/23 0500  Comprehensive metabolic panel  Daily,   R      02/27/23 0828   02/27/23 0854  Basic metabolic panel  Once,   R        02/27/23 0855   02/27/23 0637  Lactic acid, plasma  (Lactic Acid)  STAT Now then every 3 hours,   STAT      02/27/23 0636   02/27/23 0006  Urine Culture  Once,   R        02/27/23 0006            Vitals/Pain Today's Vitals   02/27/23 0750 02/27/23 0800 02/27/23 0810 02/27/23 0820  BP: (!) 87/45 (!) 106/59 (!) 92/54 (!) 88/52  Pulse: 80 78 76 73  Resp: 20 19 18 18   Temp:      TempSrc:      SpO2: 98% 100% 100% 99%  Weight:      Height:        Isolation Precautions Contact precautions  Medications Medications  lactated ringers infusion (150 mL/hr Intravenous New Bag/Given 02/27/23 0804)  apixaban (ELIQUIS) tablet 5 mg (has no administration in time range)  QUEtiapine (SEROQUEL) tablet 25 mg (has no administration in time range)  sodium bicarbonate tablet 650 mg (has no administration in time range)  ondansetron (ZOFRAN) tablet 4 mg (has no administration in time range)    Or  ondansetron (ZOFRAN) injection 4 mg (has no administration in time range)  ceFEPIme (MAXIPIME) 2 g in sodium chloride 0.9 % 100 mL IVPB (has no administration in time range)  rosuvastatin (CRESTOR) tablet 20 mg (has no administration in time range)  memantine (NAMENDA) tablet 10 mg (has no administration in time range)   Oxcarbazepine (TRILEPTAL) tablet 300 mg (has no administration in time range)  insulin aspart (novoLOG) injection 0-9 Units (has no administration in time range)  insulin aspart (novoLOG) injection 3 Units (has no administration in time range)  lactated ringers bolus 1,000 mL (0 mLs Intravenous Stopped 02/27/23 0154)  lactated ringers bolus 1,000 mL (0 mLs Intravenous Stopped 02/27/23 0330)  ceFEPIme (MAXIPIME) 2 g in sodium chloride 0.9 % 100 mL IVPB (0 g Intravenous Stopped 02/27/23 0330)  sodium zirconium cyclosilicate (LOKELMA) packet 10 g (10 g Oral Given 02/27/23 0510)  acetaminophen (TYLENOL) tablet 1,000 mg (1,000 mg  Oral Given 02/27/23 0510)  lactated ringers bolus 1,000 mL (0 mLs Intravenous Stopped 02/27/23 0740)  lactated ringers bolus 1,000 mL (1,000 mLs Intravenous New Bag/Given 02/27/23 0752)    Mobility non-ambulatory     Focused Assessments Cardiac Assessment Handoff:    Lab Results  Component Value Date   TROPONINI <0.03 01/02/2016   Lab Results  Component Value Date   DDIMER 1.83 (H) 07/30/2021   Does the Patient currently have chest pain? No    R Recommendations: See Admitting Provider Note  Report given to:   Additional Notes:

## 2023-02-27 NOTE — Assessment & Plan Note (Addendum)
Patient meeting severe sepsis criteria with Tmax of 102.8, heart rate 100s, systolic pressures 80s to 100s Concern for urinary source with noted chronic indwelling Foley with bloody urine output status post Foley replacement by the family and noted Foley balloon inflated on the prostate on imaging (repositioned in the ER) Lactate around 2.9 Pancultured Noted May 2024 urine culture showing E. coli as well as Pseudomonas with sensitivity to fourth generation cephalosporins Started empirically on IV cefepime for infectious coverage- continue  Systolic blood pressures continue in the 80s at present with maps in the mid 60s Continue with LR IV fluid resuscitation Will formally consult critical care given hypotension Follow-up recommendations

## 2023-02-27 NOTE — Assessment & Plan Note (Signed)
?   Statin.

## 2023-02-27 NOTE — Assessment & Plan Note (Signed)
Potassium 5.9 on presentation Status post Lokelma No peaked T waves on EKG Repeat BMP pending

## 2023-02-27 NOTE — Progress Notes (Signed)
CODE SEPSIS - PHARMACY COMMUNICATION  **Broad Spectrum Antibiotics should be administered within 1 hour of Sepsis diagnosis**  Time Code Sepsis Called/Page Received: 8/29 @ 0027   Antibiotics Ordered: Cefepime   Time of 1st antibiotic administration: Cefepime 2 gm IV X 1 on 8/29 @ 0153  Additional action taken by pharmacy: messaged RN around 0030 to remind to give abx   If necessary, Name of Provider/Nurse Contacted:     Even Budlong D ,PharmD Clinical Pharmacist  02/27/2023  1:58 AM

## 2023-02-27 NOTE — Assessment & Plan Note (Signed)
Cont trileptal

## 2023-02-27 NOTE — H&P (Addendum)
History and Physical    [name] ZOX:[id number] DOB: 1956/02/04 DOA: 02/26/2023 DOS: the patient was seen and examined on 02/27/2023 PCP: Dione Booze, MD  Patient coming from: Home  Chief Complaint:  Chief Complaint  Patient presents with   Hematuria   Hematemesis   HPI: [name] is a 67 y.o. male with medical history significant of  bipolar disorder, seizure disorder, type 2 diabetes, TBI, neurogenic bladder with chronic indwelling Foley, obesity, type 2 diabetes, chronic diastolic CHF, stage III CKD presenting with severe sepsis, UTI.  Limited history in the setting of TBI.  Per report, patient with hematuria at home.  Patient new Foley catheter placed yesterday around 2 PM with family reporting bloody output afterwards.  Patient reported of groin pain.  Positive generalized malaise, weakness fatigue.  No chest pain, shortness of breath.  Mild generalized abdominal pain.  No hemiparesis or confusion.?  Bloody hematemesis at home. Presented to the ER Tmax 1-2.8, heart rate 100s, blood pressure 80s to 100s over 40s to 50s.  Satting well on room air.  White count 4.6, hemoglobin 10.2, platelets 140, lactate 2.7-2.9.  Creatinine 2.47, bicarb 19, potassium 5.9. EKG w/ reversed leads, but no peaked T waves.  CT with mild right hydronephrosis as well as malpositioned Foley catheter with tip in the region of the prostatic urethra. Review of Systems: As mentioned in the history of present illness. All other systems reviewed and are negative. Past Medical History:  Diagnosis Date   Acute exacerbation of CHF (congestive heart failure) (HCC)    Anemia    Bipolar disorder (HCC)    Bladder mass    Cellulitis    Chronic anticoagulation    Chronic indwelling Foley catheter    CKD (chronic kidney disease) stage 3, GFR 30-59 ml/min (HCC)    Colostomy in place (HCC)    Diabetes mellitus without complication (HCC)    Frontotemporal dementia (HCC)    Heel ulcer  (HCC)    Left   Hematuria    History of blood clots    Hydronephrosis of right kidney    Hyperkalemia    Hypertension    Hyponatremia    Lithium toxicity    Metabolic acidosis    Neurogenic bladder    Obesity    Sacral decubitus ulcer    Sepsis (HCC)    TBI (traumatic brain injury) (HCC)    UTI (urinary tract infection)    VRE (vancomycin resistant enterococcus) culture positive    Past Surgical History:  Procedure Laterality Date   BACK SURGERY     CARPAL TUNNEL RELEASE Bilateral    COLON SURGERY     COLOSTOMY     CYSTOSCOPY W/ RETROGRADES Right 10/25/2022   Procedure: CYSTOSCOPY WITH RETROGRADE PYELOGRAM;  Surgeon: Sondra Come, MD;  Location: [facility] ORS;  Service: Urology;  Laterality: Right;   IR PERC TUN PERIT CATH WO PORT S&I /IMAG  10/11/2021   IR REMOVAL TUN CV CATH W/O FL  11/19/2021   IR US GUIDE VASC ACCESS RIGHT  10/11/2021   SACRAL DECUBITUS ULCER EXCISION     TONSILLECTOMY     Social History:  reports that he has never smoked. He has never been exposed to tobacco smoke. He has never used smokeless tobacco. He reports that he does not currently use alcohol. He reports that he does not use drugs.  No Known Allergies  Family History  Problem Relation Age of Onset   Depression Father    Deep vein thrombosis Father  Prior to Admission medications   Medication Sig Start Date End Date Taking? Authorizing Provider  amLODipine-olmesartan (AZOR) 5-20 MG tablet Take 1 tablet by mouth daily.   Yes [provider]  cholecalciferol (VITAMIN D) 25 MCG tablet Take 1 tablet (1,000 Units total) by mouth daily. 07/31/21  Yes Enedina Finner, MD  ELIQUIS 5 MG TABS tablet Take 5 mg by mouth 2 (two) times daily.  11/17/18  Yes [provider]  fish oil-omega-3 fatty acids 1000 MG capsule Take 1 g by mouth 3 (three) times a week. 10/12/21  Yes [provider]  glipiZIDE (GLUCOTROL XL) 2.5 MG 24 hr tablet Take 2.5 mg by mouth daily. 02/04/23  Yes [provider]  memantine (NAMENDA) 10 MG tablet Take 10 mg by mouth 2 (two) times daily.   Yes [provider]  Oxcarbazepine (TRILEPTAL) 300 MG tablet Take 1 tablet (300 mg total) by mouth 2 (two) times daily. 12/04/22  Yes [name], MD  rosuvastatin (CRESTOR) 20 MG tablet Take 20 mg by mouth at bedtime. 08/29/17  Yes [provider]  vitamin B-12 (CYANOCOBALAMIN) 1000 MCG tablet Take 3,000 mcg by mouth daily.   Yes [provider]  Continuous Blood Gluc Receiver (DEXCOM G7 RECEIVER) DEVI UAD for blood sugar monitoring. 01/23/22   [provider]  furosemide (LASIX) 40 MG tablet Take 1 tablet (40 mg total) by mouth daily. Patient not taking: Reported on 02/27/2023 09/26/22   Marrion Coy, MD  QUEtiapine (SEROQUEL) 25 MG tablet TAKE 1 TO 2 TABLETS BY MOUTH ONCE DAILY AS NEEDED 02/23/23   [name], MD  sodium bicarbonate 650 MG tablet Take 1 tablet (650 mg total) by mouth 2 (two) times daily. Patient not taking: Reported on 02/27/2023 09/25/22   Marrion Coy, MD    Physical Exam: Vitals:   02/27/23 0750 02/27/23 0800 02/27/23 0810 02/27/23 0820  BP: (!) 87/45 (!) 106/59 (!) 92/54 (!) 88/52  Pulse: 80 78 76 73  Resp: 20 19 18 18   Temp:      TempSrc:      SpO2: 98% 100% 100% 99%  Weight:      Height:       Physical Exam Constitutional:      Appearance: He is obese.  HENT:     Head: Normocephalic and atraumatic.     Nose: Nose normal.     Mouth/Throat:     Mouth: Mucous membranes are moist.  Cardiovascular:     Rate and Rhythm: Normal rate and regular rhythm.  Pulmonary:     Effort: Pulmonary effort is normal.  Abdominal:     General: Bowel sounds are normal.     Comments: Obese abdomen  Nontender    Musculoskeletal:        General: Normal range of motion.  Neurological:     General: No focal deficit present.     Data Reviewed:  There are no new results to review at this time. CT ABDOMEN PELVIS WO CONTRAST CLINICAL DATA:   Abdominal pain.  EXAM: CT ABDOMEN AND PELVIS WITHOUT CONTRAST  TECHNIQUE: Multidetector CT imaging of the abdomen and pelvis was performed following the standard protocol without IV contrast.  RADIATION DOSE REDUCTION: This exam was performed according to the departmental dose-optimization program which includes automated exposure control, adjustment of the mA and/or kV according to patient size and/or use of iterative reconstruction technique.  COMPARISON:  CT abdomen pelvis dated 11/26/2022.  FINDINGS: Evaluation of this exam is limited in the absence  of intravenous contrast.  Lower chest: The visualized lung bases are clear. There is coronary vascular calcification.  No intra-abdominal free air or free fluid.  Hepatobiliary: The liver is unremarkable. No biliary dilatation. The gallbladder is unremarkable  Pancreas: Unremarkable. No pancreatic ductal dilatation or surrounding inflammatory changes.  Spleen: Normal in size without focal abnormality.  Adrenals/Urinary Tract: The adrenal glands are unremarkable. There is mild right hydronephrosis. No stone noted. The left kidney is unremarkable. The urinary bladder is partially distended and grossly unremarkable. Air within the bladder introduced via the catheter. The Foley catheter with tip in the region of the prostatic urethra and balloon in the bulbous urethra. Recommend further advancing of the catheter.  Stomach/Bowel: Postsurgical changes of the bowel with left anterior loop colostomy. There is no bowel obstruction or active inflammation. The appendix is normal.  Vascular/Lymphatic: Moderate aortoiliac atherosclerotic disease. The IVC is unremarkable. No portal venous gas. There is no adenopathy.  Reproductive: The prostate and seminal vesicles are grossly unremarkable no pelvic mass.  Other: Chronic ulceration of the soft tissues superficial to the coccyx similar to prior CT. No fluid  collection.  Musculoskeletal: Degenerative changes of the hips and spine. No acute osseous pathology.  IMPRESSION: 1. Mild right hydronephrosis. No stone noted. 2. Malpositioned Foley catheter with tip in the region of the prostatic urethra. Recommend further advancing of the catheter. 3. Postsurgical changes of the bowel with left anterior loop colostomy. No bowel obstruction. Normal appendix. 4.  Aortic Atherosclerosis (ICD10-I70.0).  Electronically Signed   By: Elgie Collard M.D.   On: 02/27/2023 01:34 DG Chest Port 1 View CLINICAL DATA:  Questionable sepsis.  EXAM: PORTABLE CHEST 1 VIEW  COMPARISON:  Chest radiograph dated 11/26/2022.  FINDINGS: No focal consolidation, pleural effusion, or pneumothorax. Stable cardiac silhouette. No acute osseous pathology.  IMPRESSION: No active disease.  Electronically Signed   By: Elgie Collard M.D.   On: 02/27/2023 00:00  Lab Results  Component Value Date   WBC 4.6 02/27/2023   HGB 10.2 (L) 02/27/2023   HCT 33.3 (L) 02/27/2023   MCV 87.9 02/27/2023   PLT 140 (L) 02/27/2023   Last metabolic panel Lab Results  Component Value Date   GLUCOSE 176 (H) 02/27/2023   NA 134 (L) 02/27/2023   K 5.9 (H) 02/27/2023   CL 107 02/27/2023   CO2 19 (L) 02/27/2023   BUN 43 (H) 02/27/2023   CREATININE 2.47 (H) 02/27/2023   GFRNONAA 28 (L) 02/27/2023   CALCIUM 8.2 (L) 02/27/2023   PHOS 4.8 (H) 09/22/2022   PROT 8.3 (H) 02/27/2023   ALBUMIN 3.3 (L) 02/27/2023   LABGLOB 4.1 (H) 09/20/2022   AGRATIO 0.8 09/20/2022   BILITOT 0.6 02/27/2023   ALKPHOS 104 02/27/2023   AST 17 02/27/2023   ALT 15 02/27/2023   ANIONGAP 8 02/27/2023    Assessment and Plan: Sepsis [facility]) Patient meeting severe sepsis criteria with Tmax of 102.8, heart rate 100s, systolic pressures 80s to 100s Concern for urinary source with noted chronic indwelling Foley with bloody urine output status post Foley replacement by the family and noted Foley balloon  inflated on the prostate on imaging (repositioned in the ER) Lactate around 2.9 Pancultured Noted May 2024 urine culture showing E. coli as well as Pseudomonas with sensitivity to fourth generation cephalosporins Started empirically on IV cefepime for infectious coverage- continue  Systolic blood pressures continue in the 80s at present with maps in the mid 60s Continue with LR IV fluid resuscitation Will formally consult critical  care given hypotension Follow-up recommendations    Complicated UTI (urinary tract infection) Baseline chronic indwelling Foley in the setting of neurogenic bladder Concern for urinary source in setting of active sepsis with noted chronic indwelling Foley with bloody urine output status post Foley replacement by the family and noted Foley balloon inflated on the prostate on imaging (repositioned in the ER) May 2024 urine culture is showing E. coli as well as Pseudomonas with sensitivities to fourth generation cephalosporins IV cefepime for infectious coverage Monitor urine culture Follow   Type II diabetes mellitus with renal manifestations (HCC) SSI A1c  Dyslipidemia Statin  Essential hypertension HOLD BP regimen in setting of sepsis and hypotension    (HFpEF) heart failure with preserved ejection fraction (HCC) 2D ECHO 08/2021 w/ EF 60-65%, grade 3 diastolic dysfunction  Euvolemic to dry on presentation in setting of sepsis  Monitor volume status closely w/ IVF  Strict Is and Os and daily weights  Follow   CKD (chronic kidney disease), stage III (HCC) Baseline creatinine around 2-2.6 with GFR in the upper 20s to 30s Creatinine 2.47 today with GFR in the upper 20s Monitor Minimize nephrotoxic agents Restart oral bicarb in the setting of concomitant metabolic acidosis  Hyperkalemia Potassium 5.9 on presentation Status post Lokelma No peaked T waves on EKG Repeat BMP pending   Seizure prophylaxis Cont trileptal   Colostomy in place  (HCC) Appears stable in setting of sepsis  Wound/ostomy consult    Anemia of chronic disease Baseline hgb 8-10  Hgb 10 today  Follow    History of DVT (deep vein thrombosis) Cont eliquis   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 Inpatient status is judged to be reasonable and necessary in order to provide the required intensity of service to ensure the patient's safety. The patient's presenting symptoms, physical exam findings, and initial radiographic and laboratory data in the context of their chronic comorbidities is felt to place them at high risk for further clinical deterioration. Furthermore, it is not anticipated that the patient will be medically stable for discharge from the hospital within 2 midnights of admission.   * I certify that at the point of admission it is my clinical judgment that the patient will require inpatient hospital care spanning beyond 2 midnights from the point of admission due to high intensity of service, high risk for further deterioration and high frequency of surveillance required.*  Author: Floydene Flock, MD 02/27/2023 8:49 AM  For on call review [email or web address].

## 2023-02-27 NOTE — Consult Note (Signed)
WOC Nurse ostomy consult note Patient known to [facility] nursing team.  Stoma type/location: LLQ colostomy since 2021 Ostomy pouching: 2pc. 2 3/4" pouch with barrier ring Hart [place] # [phone] (2 3/4" skin barrier), Lawson # [phone] (2 3/4" pouch), Hart [place] # [phone] (2" barrier ring)   Patient is followed in the [facility] for chronic sacral wound and heels wound last seen 02/04/23 per Dr. Larina Bras (62 weeks).  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   Will substitute similar products for use while inpatient.  Cleanse wounds with saline, pat dry Cut to fit silver hydrofiber (Aquacel Ag+) top with foam (heels), top sacral wound with ABD pads and secure with tape.  Change all wounds daily  Low air loss mattress for moisture management and pressure redistribution.    FU with WCC as scheduled 03/18/23   Re consult if needed, will not follow at this time. Thanks  Troye Hiemstra [employer], RN,CWOCN, CNS, CWON-AP [phone])

## 2023-02-27 NOTE — Assessment & Plan Note (Signed)
Cont eliquis 

## 2023-02-27 NOTE — Consult Note (Signed)
NAME:  [name], MRN:  [id number], DOB:  Sep 06, 1955, LOS: 0 ADMISSION DATE:  02/26/2023, CONSULTATION DATE:  02/27/2023 REFERRING MD:  Dr. Alvester Morin, CHIEF COMPLAINT:  Hematuria  Brief Pt Description / Synopsis:  67 y.o male with PMHx most significant for TBI with Neurogenic bladder requiring chronic indwelling foley catheter and DVT on Eliquis, admitted with Hematuria and Severe Sepsis from UTI and malpositioned catheter.  History of Present Illness:  [name] is a 67 y.o. male with medical history significant of  bipolar disorder, seizure disorder, type 2 diabetes, TBI, neurogenic bladder with chronic indwelling Foley, obesity, type 2 diabetes, chronic diastolic CHF, stage III CKD who presented to [facility] ED on 02/26/23 due to complaints of hematuria at home.  Patient currently poor historian and no family at bedside, therefore history obtained from chart review.  It is reported that his family placed a new foley yesterday around 2:00 PM, which was followed by bloody urine output, vague lower abdominal pain, and altered mental status. In the ED he reported groin pain, generalized malaise, and chills.  He currently denies dizziness, chest pain, SOB, abdominal pain, N/V/D.  ED Course: Initial Vital Signs: Temperature 102.8, RR 28, Pulse 99, BP 137/65, Spo2 99% on room air Significant Labs: White count 4.6, hemoglobin 10.2, platelets 140, lactate 2.7-2.9.  Creatinine 2.47, bicarb 19, potassium 5.9.  Imaging Chest X-ray>>IMPRESSION: No active disease. CT Abdomen & Pelvis>> IMPRESSION: 1. Mild right hydronephrosis. No stone noted. 2. Malpositioned Foley catheter with tip in the region of the prostatic urethra. Recommend further advancing of the catheter. 3. Postsurgical changes of the bowel with left anterior loop colostomy. No bowel obstruction. Normal appendix. 4.  Aortic Atherosclerosis (ICD10-I70.0). Medications Administered: 4L IV fluid boluses, Cefepime, Lokelma  He  met Sepsis criteria, therefore he was given fluid resuscitation along with broad spectrum antibiotics.  Hospitalists were asked to admit for further workup and treatment.  While boarding in the ED, BP remains soft despite IV fluid resuscitation.  PCCM consulted in case patient were to need vasopressors.  Please see "Significant Hospital Events" section below for full detailed hospital course.  Pertinent  Medical History   Past Medical History:  Diagnosis Date   Acute exacerbation of CHF (congestive heart failure) (HCC)    Anemia    Bipolar disorder (HCC)    Bladder mass    Cellulitis    Chronic anticoagulation    Chronic indwelling Foley catheter    CKD (chronic kidney disease) stage 3, GFR 30-59 ml/min (HCC)    Colostomy in place (HCC)    Diabetes mellitus without complication (HCC)    Frontotemporal dementia (HCC)    Heel ulcer (HCC)    Left   Hematuria    History of blood clots    Hydronephrosis of right kidney    Hyperkalemia    Hypertension    Hyponatremia    Lithium toxicity    Metabolic acidosis    Neurogenic bladder    Obesity    Sacral decubitus ulcer    Sepsis (HCC)    TBI (traumatic brain injury) (HCC)    UTI (urinary tract infection)    VRE (vancomycin resistant enterococcus) culture positive      Micro Data:  8/29: Blood culture x2>> 8/29: Urine>> 8/29: COVID-19/RSV/Flu PCR>> negative  Antimicrobials:   Anti-infectives (From admission, onward)    Start     Dose/Rate Route Frequency Ordered Stop   02/27/23 1200  ceFEPIme (MAXIPIME) 2 g in sodium chloride 0.9 % 100 mL IVPB  2 g 200 mL/hr over 30 Minutes Intravenous Every 12 hours 02/27/23 0817     02/27/23 0030  ceFEPIme (MAXIPIME) 2 g in sodium chloride 0.9 % 100 mL IVPB        2 g 200 mL/hr over 30 Minutes Intravenous  Once 02/27/23 0027 02/27/23 0330       Significant Hospital Events: Including procedures, antibiotic start and stop dates in addition to other pertinent events   8/28:  Presented to ED, found to have malpositioned foley catheter, repositioned in ED.  Hospitalists asked to admit for Sepsis due to UTI. 8/29: BP very soft despite IV fluid resuscitation.   PCCM consulted in case pt was to need vasopressors.  Interim History / Subjective:  -Pt seen after arrival to [facility] unit -SBP in 90's, currently NOT requiring Levophed ~ pt is awake and alert, asymptomatic, lactic acid has normalized -Pt currently denies all complains -Does have hematuria in foley bag  Objective   Blood pressure (!) 88/52, pulse 73, temperature 100.1 F (37.8 C), temperature source Rectal, resp. rate 18, height 5\' 6"  (1.676 m), weight 125.4 kg, SpO2 99%.       No intake or output data in the 24 hours ending 02/27/23 0835 Filed Weights   02/26/23 2335  Weight: 125.4 kg    Examination: General: Acute on chronically ill-appearing obese male, laying in bed, on room air, no acute distress HENT: Atraumatic, normocephalic, neck supple, difficult to assess JVD due to body habitus Lungs: Clear breath sounds bilaterally, even, nonlabored Cardiovascular: Regular rate and rhythm, S1-S2, no murmurs, rubs, gallops Abdomen: Obese, soft, nontender, nondistended, no guarding or rebound tenderness, bowel sounds positive x 4, colostomy in place productive of stool Extremities: No deformities, 2+ edema bilateral lower extremities Neuro: Awake and alert, oriented x 3, speech clear, no focal deficits noted GU: Foley catheter in place with hematuria  Resolved Hospital Problem list     Assessment & Plan:   #Hypotension, concern for developing septic shock ~ currently not requiring vasopressors #Chronic HFpEF without acute exacerbation PMHx: HTN, nonocclusive DVT in left common femoral in May 2024 on Eliquis Echocardiogram 08/2021: LVEF 60-65%, Grade III DD -Continuous cardiac monitoring -Maintain MAP >65 -Gentle IV fluids -Vasopressors as needed to maintain MAP goal  -Lactic acid has  normalized -Trend HS Troponin until peaked -Consider repeat Echocardiogram -Diuresis as BP and renal function permits ~ holding due to soft BP and AKI -Hold home Eliquis for now given acute hematuria  #Meets SIRS Criteria on presentation (Temperature 102.8, RR 28, HR 99, Lactic acid 2.7) #Severe Sepsis in setting of UTI (multiorgan dysfunction including: AKI, hypotension) PMHx: UTI's due to chronic foley (E coli and Pseudomonas in May 2024), 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 -Monitor fever curve -Trend WBC's & Procalcitonin -Follow cultures as above -Continue empiric Cefepime pending cultures & sensitivities  #Acute Kidney Injury on CKD Stage III #Hyperkalemia #Hematuria PMHx: Neurogenic bladder due to TBI requiring chronic foley catheter CT Abdomen & Pelvis on presentation with mild right hydronephrosis (no stone) and malpositioned foley with tip in the prostatic urethra -Monitor I&O's / urinary output -Follow BMP -Ensure adequate renal perfusion -Avoid nephrotoxic agents as able -Replace electrolytes as indicated ~ Pharmacy following for assistance with electrolyte replacement -IV Fluids -Continue Bicarb tablets -Received Lokelma, f/u K+ later today -Foley was repositioned, Consider Urology consult  #Mild Thrombocytopenia, suspect in setting of sepsis #Anemia of chronic disease -Monitor for S/Sx of bleeding -Trend CBC -Eliquis for Anticoagulation/VTE Prophylaxis (hold 8/29 for hematuria) -  Transfuse for Hgb <7  #Acute Metabolic Encephalopathy in setting of sepsis #History of Traumatic Brain Injury -Treatment of sepsis & metabolic derangements as outlined above -Provide supportive care -Promote normal sleep/wake cycle and family presence -Avoid sedating medications as able    Best Practice (right click and "Reselect all SmartList Selections" daily)   Diet/type: Regular consistency (see orders) DVT prophylaxis: DOAC GI prophylaxis: N/A Lines:  N/A Foley:  Yes, and it is still needed Code Status:  full code Last date of multidisciplinary goals of care discussion [8/29]  8/29: Patient updated at bedside  Labs   CBC: Recent Labs  Lab 02/27/23 0006  WBC 4.6  NEUTROABS 3.9  HGB 10.2*  HCT 33.3*  MCV 87.9  PLT 140*    Basic Metabolic Panel: Recent Labs  Lab 02/27/23 0006  NA 134*  K 5.9*  CL 107  CO2 19*  GLUCOSE 176*  BUN 43*  CREATININE 2.47*  CALCIUM 8.2*   GFR: Estimated Creatinine Clearance: 36.3 mL/min (A) (by C-G formula based on SCr of 2.47 mg/dL (H)). Recent Labs  Lab 02/27/23 0006 02/27/23 0402 02/27/23 0718  WBC 4.6  --   --   LATICACIDVEN 2.7* 2.9* 1.6    Liver Function Tests: Recent Labs  Lab 02/27/23 0006  AST 17  ALT 15  ALKPHOS 104  BILITOT 0.6  PROT 8.3*  ALBUMIN 3.3*   Recent Labs  Lab 02/27/23 0006  LIPASE 54*   No results for input(s): "AMMONIA" in the last 168 hours.  ABG No results found for: "PHART", "PCO2ART", "PO2ART", "HCO3", "TCO2", "ACIDBASEDEF", "O2SAT"   Coagulation Profile: Recent Labs  Lab 02/27/23 0006  INR 1.4*    Cardiac Enzymes: No results for input(s): "CKTOTAL", "CKMB", "CKMBINDEX", "TROPONINI" in the last 168 hours.  HbA1C: Hgb A1c MFr Bld  Date/Time Value Ref Range Status  09/19/2022 05:27 AM 7.0 (H) 4.8 - 5.6 % Final    Comment:    (NOTE)         Prediabetes: 5.7 - 6.4         Diabetes: >6.4         Glycemic control for adults with diabetes: <7.0   01/05/2022 05:54 AM 6.8 (H) 4.8 - 5.6 % Final    Comment:    (NOTE) Pre diabetes:          5.7%-6.4%  Diabetes:              >6.4%  Glycemic control for   <7.0% adults with diabetes     CBG: No results for input(s): "GLUCAP" in the last 168 hours.  Review of Systems:   Positives in BOLD: Gen: Denies fever, chills, weight change, fatigue, night sweats HEENT: Denies blurred vision, double vision, hearing loss, tinnitus, sinus congestion, rhinorrhea, sore throat, neck stiffness,  dysphagia PULM: Denies shortness of breath, cough, sputum production, hemoptysis, wheezing CV: Denies chest pain, edema, orthopnea, paroxysmal nocturnal dyspnea, palpitations GI: Denies abdominal pain, nausea, vomiting, diarrhea, hematochezia, melena, constipation, change in bowel habits GU: Denies dysuria, hematuria, polyuria, oliguria, urethral discharge Endocrine: Denies hot or cold intolerance, polyuria, polyphagia or appetite change Derm: Denies rash, dry skin, scaling or peeling skin change Heme: Denies easy bruising, bleeding, bleeding gums Neuro: Denies headache, numbness, weakness, slurred speech, loss of memory or consciousness   Past Medical History:  He,  has a past medical history of Acute exacerbation of CHF (congestive heart failure) (HCC), Anemia, Bipolar disorder (HCC), Bladder mass, Cellulitis, Chronic anticoagulation, Chronic indwelling Foley catheter, CKD (chronic kidney  disease) stage 3, GFR 30-59 ml/min (HCC), Colostomy in place [facility]), Diabetes mellitus without complication (HCC), Frontotemporal dementia (HCC), Heel ulcer (HCC), Hematuria, History of blood clots, Hydronephrosis of right kidney, Hyperkalemia, Hypertension, Hyponatremia, Lithium toxicity, Metabolic acidosis, Neurogenic bladder, Obesity, Sacral decubitus ulcer, Sepsis (HCC), TBI (traumatic brain injury) (HCC), UTI (urinary tract infection), and VRE (vancomycin resistant enterococcus) culture positive.   Surgical History:   Past Surgical History:  Procedure Laterality Date   BACK SURGERY     CARPAL TUNNEL RELEASE Bilateral    COLON SURGERY     COLOSTOMY     CYSTOSCOPY W/ RETROGRADES Right 10/25/2022   Procedure: CYSTOSCOPY WITH RETROGRADE PYELOGRAM;  Surgeon: Sondra Come, MD;  Location: [facility] ORS;  Service: Urology;  Laterality: Right;   IR PERC TUN PERIT CATH WO PORT S&I /IMAG  10/11/2021   IR REMOVAL TUN CV CATH W/O FL  11/19/2021   IR US GUIDE VASC ACCESS RIGHT  10/11/2021   SACRAL DECUBITUS ULCER  EXCISION     TONSILLECTOMY       Social History:   reports that he has never smoked. He has never been exposed to tobacco smoke. He has never used smokeless tobacco. He reports that he does not currently use alcohol. He reports that he does not use drugs.   Family History:  His family history includes Deep vein thrombosis in his father; Depression in his father.   Allergies No Known Allergies   Home Medications  Prior to Admission medications   Medication Sig Start Date End Date Taking? Authorizing Provider  amLODipine-olmesartan (AZOR) 5-20 MG tablet Take 1 tablet by mouth daily.   Yes [provider]  cholecalciferol (VITAMIN D) 25 MCG tablet Take 1 tablet (1,000 Units total) by mouth daily. 07/31/21  Yes Enedina Finner, MD  ELIQUIS 5 MG TABS tablet Take 5 mg by mouth 2 (two) times daily.  11/17/18  Yes [provider]  fish oil-omega-3 fatty acids 1000 MG capsule Take 1 g by mouth 3 (three) times a week. 10/12/21  Yes [provider]  glipiZIDE (GLUCOTROL XL) 2.5 MG 24 hr tablet Take 2.5 mg by mouth daily. 02/04/23  Yes [provider]  memantine (NAMENDA) 10 MG tablet Take 10 mg by mouth 2 (two) times daily.   Yes [provider]  Oxcarbazepine (TRILEPTAL) 300 MG tablet Take 1 tablet (300 mg total) by mouth 2 (two) times daily. 12/04/22  Yes [name], MD  rosuvastatin (CRESTOR) 20 MG tablet Take 20 mg by mouth at bedtime. 08/29/17  Yes [provider]  vitamin B-12 (CYANOCOBALAMIN) 1000 MCG tablet Take 3,000 mcg by mouth daily.   Yes [provider]  Continuous Blood Gluc Receiver (DEXCOM G7 RECEIVER) DEVI UAD for blood sugar monitoring. 01/23/22   [provider]  furosemide (LASIX) 40 MG tablet Take 1 tablet (40 mg total) by mouth daily. Patient not taking: Reported on 02/27/2023 09/26/22   Marrion Coy, MD  QUEtiapine (SEROQUEL) 25 MG tablet TAKE 1 TO 2 TABLETS BY MOUTH ONCE DAILY AS NEEDED 02/23/23   [name],  MD  sodium bicarbonate 650 MG tablet Take 1 tablet (650 mg total) by mouth 2 (two) times daily. Patient not taking: Reported on 02/27/2023 09/25/22   Marrion Coy, MD     Critical care time: 50 minutes     Harlon Ditty, AGACNP-BC [facility] Prefer epic messenger for cross cover needs If after hours, please call E-link

## 2023-02-27 NOTE — Assessment & Plan Note (Signed)
2D ECHO 08/2021 w/ EF 60-65%, grade 3 diastolic dysfunction  Euvolemic to dry on presentation in setting of sepsis  Monitor volume status closely w/ IVF  Strict Is and Os and daily weights  Follow

## 2023-02-27 NOTE — Assessment & Plan Note (Signed)
Cont namenda

## 2023-02-27 NOTE — Progress Notes (Signed)
Elink is following sepsis bundle. 

## 2023-02-27 NOTE — Sepsis Progress Note (Signed)
Elink monitoring for the code sepsis protocol.  

## 2023-02-27 NOTE — Assessment & Plan Note (Signed)
Baseline hgb 8-10  Hgb 10 today  Follow

## 2023-02-27 NOTE — Assessment & Plan Note (Signed)
Baseline chronic indwelling Foley in the setting of neurogenic bladder Concern for urinary source in setting of active sepsis with noted chronic indwelling Foley with bloody urine output status post Foley replacement by the family and noted Foley balloon inflated on the prostate on imaging (repositioned in the ER) May 2024 urine culture is showing E. coli as well as Pseudomonas with sensitivities to fourth generation cephalosporins IV cefepime for infectious coverage Monitor urine culture Follow

## 2023-02-27 NOTE — Assessment & Plan Note (Signed)
Appears stable in setting of sepsis  Wound/ostomy consult

## 2023-02-27 NOTE — Progress Notes (Signed)
Pharmacy Antibiotic Note  [name] is a 67 y.o. male w/ PMH of prior colostomy, multiple episodes of cellulitis of various parts of his body, and chronic indwelling Foley catheter. admitted on 02/26/2023 with UTI.  Pharmacy has been consulted for cefepime dosing.  Plan: start cefepime 2 grams IV every 12 hours ---follow renal function for needed dose adjustments  Height: 5\' 6"  (167.6 cm) Weight: 125.4 kg (276 lb 7.3 oz) IBW/kg (Calculated) : 63.8  Temp (24hrs), Avg:100.4 F (38 C), Min:98.3 F (36.8 C), Max:102.8 F (39.3 C)  Recent Labs  Lab 02/27/23 0006 02/27/23 0402 02/27/23 0718  WBC 4.6  --   --   CREATININE 2.47*  --   --   LATICACIDVEN 2.7* 2.9* 1.6    Estimated Creatinine Clearance: 36.3 mL/min (A) (by C-G formula based on SCr of 2.47 mg/dL (H)).    No Known Allergies  Antimicrobials this admission: 08/29 cefepime >>   Microbiology results: 08/29 BCx: pending 08/29 UCx: pending   Thank you for allowing pharmacy to be a part of this patient's care.  Lowella Bandy 02/27/2023 8:14 AM

## 2023-02-27 NOTE — Progress Notes (Signed)
PHARMACY - PHYSICIAN COMMUNICATION CRITICAL VALUE ALERT - BLOOD CULTURE IDENTIFICATION (BCID)  [name] is an 67 y.o. male who presented to [facility] on 02/26/2023 with a chief complaint of sepsis  Assessment:  1/4 (aerobic) GNR, BCID positive for Klebsiella pneumoniae.  Name of physician (or Provider) Contacted: Dr. Alvester Morin  Current antibiotics: cefepime  Changes to prescribed antibiotics recommended:  Provider wishes to continue with cefepime at this time.  Results for orders placed or performed during the hospital encounter of 02/26/23  Blood Culture ID Panel (Reflexed) (Collected: 02/27/2023 12:07 AM)  Result Value Ref Range   Enterococcus faecalis NOT DETECTED NOT DETECTED   Enterococcus Faecium NOT DETECTED NOT DETECTED   Listeria monocytogenes NOT DETECTED NOT DETECTED   Staphylococcus species NOT DETECTED NOT DETECTED   Staphylococcus aureus (BCID) NOT DETECTED NOT DETECTED   Staphylococcus epidermidis NOT DETECTED NOT DETECTED   Staphylococcus lugdunensis NOT DETECTED NOT DETECTED   Streptococcus species NOT DETECTED NOT DETECTED   Streptococcus agalactiae NOT DETECTED NOT DETECTED   Streptococcus pneumoniae NOT DETECTED NOT DETECTED   Streptococcus pyogenes NOT DETECTED NOT DETECTED   A.calcoaceticus-baumannii NOT DETECTED NOT DETECTED   Bacteroides fragilis NOT DETECTED NOT DETECTED   Enterobacterales DETECTED (A) NOT DETECTED   Enterobacter cloacae complex NOT DETECTED NOT DETECTED   Escherichia coli NOT DETECTED NOT DETECTED   Klebsiella aerogenes NOT DETECTED NOT DETECTED   Klebsiella oxytoca NOT DETECTED NOT DETECTED   Klebsiella pneumoniae DETECTED (A) NOT DETECTED   Proteus species NOT DETECTED NOT DETECTED   Salmonella species NOT DETECTED NOT DETECTED   Serratia marcescens NOT DETECTED NOT DETECTED   Haemophilus influenzae NOT DETECTED NOT DETECTED   Neisseria meningitidis NOT DETECTED NOT DETECTED   Pseudomonas aeruginosa NOT DETECTED NOT  DETECTED   Stenotrophomonas maltophilia NOT DETECTED NOT DETECTED   Candida albicans NOT DETECTED NOT DETECTED   Candida auris NOT DETECTED NOT DETECTED   Candida glabrata NOT DETECTED NOT DETECTED   Candida krusei NOT DETECTED NOT DETECTED   Candida parapsilosis NOT DETECTED NOT DETECTED   Candida tropicalis NOT DETECTED NOT DETECTED   Cryptococcus neoformans/gattii NOT DETECTED NOT DETECTED   CTX-M ESBL NOT DETECTED NOT DETECTED   Carbapenem resistance IMP NOT DETECTED NOT DETECTED   Carbapenem resistance KPC NOT DETECTED NOT DETECTED   Carbapenem resistance NDM NOT DETECTED NOT DETECTED   Carbapenem resist OXA 48 LIKE NOT DETECTED NOT DETECTED   Carbapenem resistance VIM NOT DETECTED NOT DETECTED    [name] 02/27/2023  4:27 PM

## 2023-02-28 DIAGNOSIS — T83028D Displacement of other indwelling urethral catheter, subsequent encounter: Secondary | ICD-10-CM

## 2023-02-28 DIAGNOSIS — L89159 Pressure ulcer of sacral region, unspecified stage: Secondary | ICD-10-CM

## 2023-02-28 DIAGNOSIS — B961 Klebsiella pneumoniae [K. pneumoniae] as the cause of diseases classified elsewhere: Secondary | ICD-10-CM

## 2023-02-28 DIAGNOSIS — Z515 Encounter for palliative care: Secondary | ICD-10-CM

## 2023-02-28 DIAGNOSIS — N390 Urinary tract infection, site not specified: Secondary | ICD-10-CM | POA: Diagnosis not present

## 2023-02-28 DIAGNOSIS — T83511A Infection and inflammatory reaction due to indwelling urethral catheter, initial encounter: Secondary | ICD-10-CM | POA: Diagnosis not present

## 2023-02-28 DIAGNOSIS — A4150 Gram-negative sepsis, unspecified: Secondary | ICD-10-CM | POA: Diagnosis not present

## 2023-02-28 DIAGNOSIS — N319 Neuromuscular dysfunction of bladder, unspecified: Secondary | ICD-10-CM | POA: Diagnosis not present

## 2023-02-28 DIAGNOSIS — N189 Chronic kidney disease, unspecified: Secondary | ICD-10-CM

## 2023-02-28 LAB — MAGNESIUM: Magnesium: 1.7 mg/dL (ref 1.7–2.4)

## 2023-02-28 LAB — COMPREHENSIVE METABOLIC PANEL
ALT: 14 U/L (ref 0–44)
AST: 22 U/L (ref 15–41)
Albumin: 2.8 g/dL — ABNORMAL LOW (ref 3.5–5.0)
Alkaline Phosphatase: 90 U/L (ref 38–126)
Anion gap: 7 (ref 5–15)
BUN: 36 mg/dL — ABNORMAL HIGH (ref 8–23)
CO2: 20 mmol/L — ABNORMAL LOW (ref 22–32)
Calcium: 8.1 mg/dL — ABNORMAL LOW (ref 8.9–10.3)
Chloride: 105 mmol/L (ref 98–111)
Creatinine, Ser: 2.43 mg/dL — ABNORMAL HIGH (ref 0.61–1.24)
GFR, Estimated: 28 mL/min — ABNORMAL LOW (ref >60)
Glucose, Bld: 93 mg/dL (ref 70–99)
Potassium: 5 mmol/L (ref 3.5–5.1)
Sodium: 132 mmol/L — ABNORMAL LOW (ref 135–145)
Total Bilirubin: 0.5 mg/dL (ref 0.3–1.2)
Total Protein: 7.2 g/dL (ref 6.5–8.1)

## 2023-02-28 LAB — CBC
HCT: 29.3 % — ABNORMAL LOW (ref 39.0–52.0)
Hemoglobin: 9.1 g/dL — ABNORMAL LOW (ref 13.0–17.0)
MCH: 26.7 pg (ref 26.0–34.0)
MCHC: 31.1 g/dL (ref 30.0–36.0)
MCV: 85.9 fL (ref 80.0–100.0)
Platelets: 117 10*3/uL — ABNORMAL LOW (ref 150–400)
RBC: 3.41 MIL/uL — ABNORMAL LOW (ref 4.22–5.81)
RDW: 18.1 % — ABNORMAL HIGH (ref 11.5–15.5)
WBC: 8.2 10*3/uL (ref 4.0–10.5)
nRBC: 0 % (ref 0.0–0.2)

## 2023-02-28 LAB — URINE CULTURE: Culture: <10000 — AB

## 2023-02-28 LAB — GLUCOSE, CAPILLARY
Glucose-Capillary: 73 mg/dL (ref 70–99)
Glucose-Capillary: 112 mg/dL — ABNORMAL HIGH (ref 70–99)
Glucose-Capillary: 79 mg/dL (ref 70–99)
Glucose-Capillary: 108 mg/dL — ABNORMAL HIGH (ref 70–99)

## 2023-02-28 LAB — PROCALCITONIN: Procalcitonin: 48.91 ng/mL

## 2023-02-28 MED ORDER — APIXABAN 5 MG PO TABS
5.0000 mg | ORAL_TABLET | Freq: Two times a day (BID) | ORAL | Status: DC
  Administered 2023-02-28 – 2023-03-05 (×10): 5 mg via ORAL
  Filled 2023-02-28 (×10): qty 1

## 2023-02-28 MED ORDER — SODIUM CHLORIDE 0.9 % IV SOLN
2.0000 g | INTRAVENOUS | Status: DC
  Administered 2023-02-28 – 2023-03-05 (×6): 2 g via INTRAVENOUS
  Filled 2023-02-28 (×7): qty 20

## 2023-02-28 MED ORDER — ACETAMINOPHEN 325 MG PO TABS: 650.0000 mg | ORAL_TABLET | Freq: Four times a day (QID) | ORAL | Status: DC | PRN

## 2023-02-28 NOTE — Consult Note (Addendum)
NAME: [name]  DOB: [birth year]  MRN: [id number]  Date/Time: 02/28/2023 10:37 AM  REQUESTING PROVIDER: Dr.Kumar Subjective:  REASON FOR CONSULT: UTI ? [name] is a 67 y.o. with a history of   TBI  paraperesis, neurogenic bladder , indwelling foley, CKD, diverting colostomy, sacral and heel decubitus, multiple bacteremia in the past treated for MSSA bacteremia, Ecoli bacteremia, recent hematuria in March 2024 and CT had shown rt hydronephrosis, and a bladder mass, underwent cystoscopy on 10/25/22 and it did not show any mass or hydronephrosis , minimal posterior bladder wall catheter cystitis was seen Presents from home brought in by EMS for hematuria, pt had a new cath placed on 02/26/23 at 2 pm and wife noted frank blood in the bag , he was also c/o groin pain He vomited once at home, no blood Vitals in the ED  02/26/23  BP 137/65  Temp 98.3 F (36.8 C)  Pulse Rate 99  Resp 28 !  SpO2 99 %   Lab in the ED  Latest Reference Range & Units 02/27/23  WBC 4.0 - 10.5 K/uL 4.6  Hemoglobin 13.0 - 17.0 g/dL 40.9 (L)  HCT 81.1 - 91.4 % 33.3 (L)  Platelets 150 - 400 K/uL 140 (L)  Creatinine 0.61 - 1.24 mg/dL 7.82 (H)   Blood culture and urine culture sent I am asked to see patient for klebsiella bacteremia  Past Medical History:  Diagnosis Date   Acute exacerbation of CHF (congestive heart failure) (HCC)    Anemia    Bipolar disorder (HCC)    Bladder mass    Cellulitis    Chronic anticoagulation    Chronic indwelling Foley catheter    CKD (chronic kidney disease) stage 3, GFR 30-59 ml/min (HCC)    Colostomy in place (HCC)    Diabetes mellitus without complication (HCC)    Frontotemporal dementia (HCC)    Heel ulcer (HCC)    Left   Hematuria    History of blood clots    Hydronephrosis of right kidney    Hyperkalemia    Hypertension    Hyponatremia    Lithium toxicity    Metabolic acidosis    Neurogenic bladder    Obesity    Sacral decubitus ulcer     Sepsis (HCC)    TBI (traumatic brain injury) (HCC)    UTI (urinary tract infection)    VRE (vancomycin resistant enterococcus) culture positive     Past Surgical History:  Procedure Laterality Date   BACK SURGERY     CARPAL TUNNEL RELEASE Bilateral    COLON SURGERY     COLOSTOMY     CYSTOSCOPY W/ RETROGRADES Right 10/25/2022   Procedure: CYSTOSCOPY WITH RETROGRADE PYELOGRAM;  Surgeon: Sondra Come, MD;  Location: [facility] ORS;  Service: Urology;  Laterality: Right;   IR PERC TUN PERIT CATH WO PORT S&I /IMAG  10/11/2021   IR REMOVAL TUN CV CATH W/O FL  11/19/2021   IR US GUIDE VASC ACCESS RIGHT  10/11/2021   SACRAL DECUBITUS ULCER EXCISION     TONSILLECTOMY      Social History   Socioeconomic History   Marital status: Married    Spouse name: thelma   Number of children: 0   Years of education: Not on file   Highest education level: Associate degree: occupational, Scientist, product/process development, or vocational program  Occupational History   Not on file  Tobacco Use   Smoking status: Never    Passive exposure: Never   Smokeless tobacco: Never  Vaping Use   Vaping status: Never Used  Substance and Sexual Activity   Alcohol use: Not Currently   Drug use: No   Sexual activity: Not on file  Other Topics Concern   Not on file  Social History Narrative   Not on file   Social Determinants of Health   Financial Resource Strain: Low Risk  (12/31/2020)   Received from [facility], [facility]   Overall Financial Resource Strain (CARDIA)    Difficulty of Paying Living Expenses: Not very hard  Food Insecurity: No Food Insecurity (02/27/2023)   Hunger Vital Sign    Worried About Running Out of Food in the Last Year: Never true    Ran Out of Food in the Last Year: Never true  Transportation Needs: No Transportation Needs (02/27/2023)   PRAPARE - Administrator, Civil Service (Medical): No    Lack of Transportation (Non-Medical): No  Physical Activity: Inactive (09/09/2017)    Exercise Vital Sign    Days of Exercise per Week: 0 days    Minutes of Exercise per Session: 0 min  Stress: No Stress Concern Present (09/09/2017)   [employer] of Occupational Health - Occupational Stress Questionnaire    Feeling of Stress : Not at all  Social Connections: Unknown (09/09/2017)   Social Connection and Isolation Panel [NHANES]    Frequency of Communication with Friends and Family: Not on file    Frequency of Social Gatherings with Friends and Family: Not on file    Attends Religious Services: More than 4 times per year    Active Member of [employer] or Organizations: No    Attends Banker Meetings: Never    Marital Status: Married  Catering manager Violence: Not At Risk (02/27/2023)   Humiliation, Afraid, Rape, and Kick questionnaire    Fear of Current or Ex-Partner: No    Emotionally Abused: No    Physically Abused: No    Sexually Abused: No    Family History  Problem Relation Age of Onset   Depression Father    Deep vein thrombosis Father    No Known Allergies I? Current Facility-Administered Medications  Medication Dose Route Frequency Provider Last Rate Last Admin   acetaminophen (TYLENOL) tablet 650 mg  650 mg Oral Q6H PRN Gillis Santa, MD       apixaban Everlene Balls) tablet 5 mg  5 mg Oral BID Gillis Santa, MD       cefTRIAXone (ROCEPHIN) 2 g in sodium chloride 0.9 % 100 mL IVPB  2 g Intravenous Q24H Gillis Santa, MD       Chlorhexidine Gluconate Cloth 2 % PADS 6 each  6 each Topical Daily Floydene Flock, MD   6 each at 02/27/23 1043   insulin aspart (novoLOG) injection 0-9 Units  0-9 Units Subcutaneous TID WC Floydene Flock, MD   1 Units at 02/27/23 1617   insulin aspart (novoLOG) injection 3 Units  3 Units Subcutaneous TID WC Floydene Flock, MD   3 Units at 02/27/23 1836   memantine (NAMENDA) tablet 10 mg  10 mg Oral BID Floydene Flock, MD   10 mg at 02/28/23 0940   ondansetron (ZOFRAN) tablet 4 mg  4 mg Oral Q6H PRN Floydene Flock, MD        Or   ondansetron [facility]) injection 4 mg  4 mg Intravenous Q6H PRN Floydene Flock, MD       Oral care mouth rinse  15 mL  Mouth Rinse PRN Floydene Flock, MD       Oxcarbazepine (TRILEPTAL) tablet 300 mg  300 mg Oral BID Floydene Flock, MD   300 mg at 02/28/23 0941   QUEtiapine (SEROQUEL) tablet 25 mg  25 mg Oral QHS PRN Floydene Flock, MD       rosuvastatin (CRESTOR) tablet 20 mg  20 mg Oral Daily Floydene Flock, MD   20 mg at 02/28/23 0940   sodium bicarbonate tablet 650 mg  650 mg Oral BID Floydene Flock, MD   650 mg at 02/28/23 0940     Abtx:  Anti-infectives (From admission, onward)    Start     Dose/Rate Route Frequency Ordered Stop   02/28/23 1000  cefTRIAXone (ROCEPHIN) 2 g in sodium chloride 0.9 % 100 mL IVPB        2 g 200 mL/hr over 30 Minutes Intravenous Every 24 hours 02/28/23 0859     02/27/23 1200  ceFEPIme (MAXIPIME) 2 g in sodium chloride 0.9 % 100 mL IVPB  Status:  Discontinued        2 g 200 mL/hr over 30 Minutes Intravenous Every 12 hours 02/27/23 0817 02/28/23 0859   02/27/23 0030  ceFEPIme (MAXIPIME) 2 g in sodium chloride 0.9 % 100 mL IVPB        2 g 200 mL/hr over 30 Minutes Intravenous  Once 02/27/23 0027 02/27/23 0330       REVIEW OF SYSTEMS:  Const: negative fever, negative chills, negative weight loss Eyes: negative diplopia or visual changes, negative eye pain ENT: negative coryza, negative sore throat Resp: negative cough, hemoptysis, dyspnea Cards: negative for chest pain, palpitations, lower extremity edema GU:  hematuria GI: vomiting Skin: negative for rash and pruritus Heme: negative for easy bruising and gum/nose bleeding MS: negative for myalgias, arthralgias, back pain and muscle weakness Neurolo:paraparesis Psych: negative for feelings of anxiety, depression  Endocrine: negative for thyroid, diabetes Allergy/Immunology- negative for any medication or food allergies ?  Objective:  VITALS:  BP (!) 155/83   Pulse 67    Temp 98 F (36.7 C) (Oral)   Resp 13   Ht 5\' 6"  (1.676 m)   Wt 126.7 kg   SpO2 98%   BMI 45.08 kg/m  LDA Foley  PHYSICAL EXAM:  General: Alert, cooperative, no distress,  Head: Normocephalic, without obvious abnormality, atraumatic. Eyes: Conjunctivae clear, anicteric sclerae. Pupils are equal ENT Nares normal. No drainage or sinus tenderness. Lips, mucosa, and tongue normal. No Thrush Neck: Supple, symmetrical, no adenopathy, thyroid: non tender no carotid bruit and no JVD. Back:sacral wound- clean with granulating tissue- no bone felt  Lungs: b/l air entry Heart: Regular rate and rhythm, no murmur, rub or gallop. Abdomen: Soft, colostomy bag Extremities: b/l heel stage 3 wounds Skin:as above Lymph: Cervical, supraclavicular normal. Neurologic: paraperesis Pertinent Labs Lab Results CBC    Component Value Date/Time   WBC 8.2 02/28/2023 0304   RBC 3.41 (L) 02/28/2023 0304   HGB 9.1 (L) 02/28/2023 0304   HCT 29.3 (L) 02/28/2023 0304   PLT 117 (L) 02/28/2023 0304   MCV 85.9 02/28/2023 0304   MCH 26.7 02/28/2023 0304   MCHC 31.1 02/28/2023 0304   RDW 18.1 (H) 02/28/2023 0304   LYMPHSABS 0.5 (L) 02/27/2023 0006   MONOABS 0.0 (L) 02/27/2023 0006   EOSABS 0.2 02/27/2023 0006   BASOSABS 0.0 02/27/2023 0006       Latest Ref Rng & Units 02/28/2023    3:04 AM  02/27/2023    6:15 PM 02/27/2023    9:03 AM  CMP  Glucose 70 - 99 mg/dL 93   85   BUN 8 - 23 mg/dL 36   39   Creatinine 1.61 - 1.24 mg/dL 0.96   0.45   Sodium 409 - 145 mmol/L 132   131   Potassium 3.5 - 5.1 mmol/L 5.0  4.6  5.4   Chloride 98 - 111 mmol/L 105   110   CO2 22 - 32 mmol/L 20   18   Calcium 8.9 - 10.3 mg/dL 8.1   7.6   Total Protein 6.5 - 8.1 g/dL 7.2     Total Bilirubin 0.3 - 1.2 mg/dL 0.5     Alkaline Phos 38 - 126 U/L 90     AST 15 - 41 U/L 22     ALT 0 - 44 U/L 14         Microbiology: Recent Results (from the past 240 hour(s))  Blood Culture (routine x 2)     Status: None (Preliminary  result)   Collection Time: 02/27/23 12:06 AM   Specimen: BLOOD  Result Value Ref Range Status   Specimen Description BLOOD BLOOD RIGHT ARM  Final   Special Requests   Final    BOTTLES DRAWN AEROBIC AND ANAEROBIC Blood Culture results may not be optimal due to an excessive volume of blood received in culture bottles   Culture  Setup Time   Final    GRAM NEGATIVE RODS AEROBIC BOTTLE ONLY CRITICAL VALUE NOTED.  VALUE IS CONSISTENT WITH PREVIOUSLY REPORTED AND CALLED VALUE. Performed at [facility], [address] [place], [place] [address]    Culture GRAM NEGATIVE RODS  Final   Report Status PENDING  Incomplete  Resp panel by RT-PCR (RSV, Flu A&B, Covid) Anterior Nasal Swab     Status: None   Collection Time: 02/27/23 12:06 AM   Specimen: Anterior Nasal Swab  Result Value Ref Range Status   SARS Coronavirus 2 by RT PCR NEGATIVE NEGATIVE Final    Comment: (NOTE) SARS-CoV-2 target nucleic acids are NOT DETECTED.  The SARS-CoV-2 RNA is generally detectable in upper respiratory specimens during the acute phase of infection. The lowest concentration of SARS-CoV-2 viral copies this assay can detect is 138 copies/mL. A negative result does not preclude SARS-Cov-2 infection and should not be used as the sole basis for treatment or other patient management decisions. A negative result may occur with  improper specimen collection/handling, submission of specimen other than nasopharyngeal swab, presence of viral mutation(s) within the areas targeted by this assay, and inadequate number of viral copies(<138 copies/mL). A negative result must be combined with clinical observations, patient history, and epidemiological information. The expected result is Negative.  Fact Sheet for Patients:  BloggerCourse.com  Fact Sheet for Healthcare Providers:  SeriousBroker.it  This test is no t yet approved or cleared by the [place] FDA  and  has been authorized for detection and/or diagnosis of SARS-CoV-2 by FDA under an Emergency Use Authorization (EUA). This EUA will remain  in effect (meaning this test can be used) for the duration of the COVID-19 declaration under Section 564(b)(1) of the Act, 21 U.S.C.section 360bbb-3(b)(1), unless the authorization is terminated  or revoked sooner.       Influenza A by PCR NEGATIVE NEGATIVE Final   Influenza B by PCR NEGATIVE NEGATIVE Final    Comment: (NOTE) The Xpert Xpress SARS-CoV-2/FLU/RSV plus assay is intended as an aid in the  diagnosis of influenza from Nasopharyngeal swab specimens and should not be used as a sole basis for treatment. Nasal washings and aspirates are unacceptable for Xpert Xpress SARS-CoV-2/FLU/RSV testing.  Fact Sheet for Patients: BloggerCourse.com  Fact Sheet for Healthcare Providers: SeriousBroker.it  This test is not yet approved or cleared by the [place] FDA and has been authorized for detection and/or diagnosis of SARS-CoV-2 by FDA under an Emergency Use Authorization (EUA). This EUA will remain in effect (meaning this test can be used) for the duration of the COVID-19 declaration under Section 564(b)(1) of the Act, 21 U.S.C. section 360bbb-3(b)(1), unless the authorization is terminated or revoked.     Resp Syncytial Virus by PCR NEGATIVE NEGATIVE Final    Comment: (NOTE) Fact Sheet for Patients: BloggerCourse.com  Fact Sheet for Healthcare Providers: SeriousBroker.it  This test is not yet approved or cleared by the [place] FDA and has been authorized for detection and/or diagnosis of SARS-CoV-2 by FDA under an Emergency Use Authorization (EUA). This EUA will remain in effect (meaning this test can be used) for the duration of the COVID-19 declaration under Section 564(b)(1) of the Act, 21 U.S.C. section 360bbb-3(b)(1),  unless the authorization is terminated or revoked.  Performed at [facility], [address], [place], [place] [address]   Urine Culture     Status: Abnormal   Collection Time: 02/27/23 12:06 AM   Specimen: Urine, Random  Result Value Ref Range Status   Specimen Description   Final    URINE, RANDOM Performed at [facility], [address], [place], [place] [address]    Special Requests   Final    NONE Reflexed from [phone] Performed at [facility], [address] [place], [place] [address]    Culture (A)  Final    <10,000 COLONIES/mL INSIGNIFICANT GROWTH Performed at [facility], [address] [place], [place] [address]    Report Status 02/28/2023 FINAL  Final  Blood Culture (routine x 2)     Status: Abnormal (Preliminary result)   Collection Time: 02/27/23 12:07 AM   Specimen: BLOOD  Result Value Ref Range Status   Specimen Description   Final    BLOOD BLOOD LEFT ARM Performed at [facility], [address] [place], [place] [address]    Special Requests   Final    BOTTLES DRAWN AEROBIC AND ANAEROBIC Blood Culture results may not be optimal due to an excessive volume of blood received in culture bottles Performed at [facility], [address] [place], [place] [address]    Culture  Setup Time   Final    Organism ID to follow GRAM NEGATIVE RODS IN BOTH AEROBIC AND ANAEROBIC BOTTLES CRITICAL RESULT CALLED TO, READ BACK BY AND VERIFIED WITH: TREY GREENWOOD 02/27/23 1625 KLW GRAM STAIN REVIEWED-AGREE WITH RESULT    Culture (A)  Final    KLEBSIELLA PNEUMONIAE CULTURE REINCUBATED FOR BETTER GROWTH Performed at [facility], [address], [place], [place] [address]    Report Status PENDING  Incomplete  Blood Culture ID Panel (Reflexed)     Status: Abnormal   Collection Time: 02/27/23 12:07 AM  Result Value Ref Range Status   Enterococcus faecalis NOT DETECTED NOT DETECTED Final   Enterococcus Faecium  NOT DETECTED NOT DETECTED Final   Listeria monocytogenes NOT DETECTED NOT DETECTED Final   Staphylococcus species NOT DETECTED NOT DETECTED Final   Staphylococcus aureus (BCID) NOT DETECTED NOT DETECTED Final   Staphylococcus epidermidis NOT  DETECTED NOT DETECTED Final   Staphylococcus lugdunensis NOT DETECTED NOT DETECTED Final   Streptococcus species NOT DETECTED NOT DETECTED Final   Streptococcus agalactiae NOT DETECTED NOT DETECTED Final   Streptococcus pneumoniae NOT DETECTED NOT DETECTED Final   Streptococcus pyogenes NOT DETECTED NOT DETECTED Final   A.calcoaceticus-baumannii NOT DETECTED NOT DETECTED Final   Bacteroides fragilis NOT DETECTED NOT DETECTED Final   Enterobacterales DETECTED (A) NOT DETECTED Final    Comment: Enterobacterales represent a large order of gram negative bacteria, not a single organism. CRITICAL RESULT CALLED TO, READ BACK BY AND VERIFIED WITH: TREY GREENWOOD 02/27/23 1625 KLW    Enterobacter cloacae complex NOT DETECTED NOT DETECTED Final   Escherichia coli NOT DETECTED NOT DETECTED Final   Klebsiella aerogenes NOT DETECTED NOT DETECTED Final   Klebsiella oxytoca NOT DETECTED NOT DETECTED Final   Klebsiella pneumoniae DETECTED (A) NOT DETECTED Final    Comment: CRITICAL RESULT CALLED TO, READ BACK BY AND VERIFIED WITH: TREY GREENWOOD 02/27/23 1625 KLW    Proteus species NOT DETECTED NOT DETECTED Final   Salmonella species NOT DETECTED NOT DETECTED Final   Serratia marcescens NOT DETECTED NOT DETECTED Final   Haemophilus influenzae NOT DETECTED NOT DETECTED Final   Neisseria meningitidis NOT DETECTED NOT DETECTED Final   Pseudomonas aeruginosa NOT DETECTED NOT DETECTED Final   Stenotrophomonas maltophilia NOT DETECTED NOT DETECTED Final   Candida albicans NOT DETECTED NOT DETECTED Final   Candida auris NOT DETECTED NOT DETECTED Final   Candida glabrata NOT DETECTED NOT DETECTED Final   Candida krusei NOT DETECTED NOT DETECTED Final   Candida  parapsilosis NOT DETECTED NOT DETECTED Final   Candida tropicalis NOT DETECTED NOT DETECTED Final   Cryptococcus neoformans/gattii NOT DETECTED NOT DETECTED Final   CTX-M ESBL NOT DETECTED NOT DETECTED Final   Carbapenem resistance IMP NOT DETECTED NOT DETECTED Final   Carbapenem resistance KPC NOT DETECTED NOT DETECTED Final   Carbapenem resistance NDM NOT DETECTED NOT DETECTED Final   Carbapenem resist OXA 48 LIKE NOT DETECTED NOT DETECTED Final   Carbapenem resistance VIM NOT DETECTED NOT DETECTED Final    Comment: Performed at [facility], [address] [place], [place] [address]  MRSA Next Gen by PCR, Nasal     Status: None   Collection Time: 02/27/23 10:35 AM   Specimen: Nasal Mucosa; Nasal Swab  Result Value Ref Range Status   MRSA by PCR Next Gen NOT DETECTED NOT DETECTED Final    Comment: (NOTE) The GeneXpert MRSA Assay (FDA approved for NASAL specimens only), is one component of a comprehensive MRSA colonization surveillance program. It is not intended to diagnose MRSA infection nor to guide or monitor treatment for MRSA infections. Test performance is not FDA approved in patients less than 43 years old. Performed at [facility], [address] [place], [place] [address]     IMAGING RESULTS: Ct abdomen on 02/27/23 Mild rt hydronephrosis  Malpositioned foley catheter in prostate urethra CXR- N I have personally reviewed the films ? Impression/Recommendation Hematuria from foley malposition  Klebsiella pneumoniae bacteremia likely from complicated  UTI Especially with hematuria Pt on ceftriaxone Urine is clear now  Neurogenic bladder- has chronic foley  CKD  Paraparesis chronic  sacral decubitus- 3 cm  clean base Colostomy  Anemia  H/o multiple bacteremias in the past  ? ?Discussed with wife, patient, nurse and hospitalist  ID will follow him peripherally this weekend [facility] on call this weekend- they are available by phone for  urgent issues ? ___________________________________________________ Discussed with patient, requesting provider Note:  This document was prepared using Dragon voice recognition software and may include unintentional dictation errors.

## 2023-02-28 NOTE — Progress Notes (Signed)
[name] ([id number]) [id number] Page 1 of 9 Visit Report for 02/25/2023 Arrival Information Details Patient Name: Date of Service: [name] [facility] 02/25/2023 8:45 A M Medical Record Number: [id number] Patient Account Number: [id number] Date of Birth/Sex: Treating RN: 1955-08-21 (67 y.o. [name]) [name] Primary Care Kevionna Heffler: Dione Booze Other Clinician: Referring Nhan Qualley: Treating Jeanette Moffatt/Extender: RO BSO Dorris Carnes, MICHA EL Della Goo in Treatment: 65 Visit Information History Since Last Visit Added or deleted any medications: No Patient Arrived: Wheel Chair Any new allergies or adverse reactions: No Arrival Time: 09:10 Had a fall or experienced change in No Accompanied By: self activities of daily living that may affect Transfer Assistance: None risk of falls: Patient Identification Verified: Yes Signs or symptoms of abuse/neglect since last visito No Secondary Verification Process Completed: Yes Hospitalized since last visit: No Patient Requires Transmission-Based Precautions: No Implantable device outside of the clinic excluding No Patient Has Alerts: Yes cellular tissue based products placed in the center Patient Alerts: Patient on Blood Thinner since last visit: Has Dressing in Place as Prescribed: Yes Pain Present Now: No Electronic Signature(s) Signed: 02/28/2023 11:58:03 AM By: [name] RN Entered By: [name] on 02/25/2023 06:10:32 -------------------------------------------------------------------------------- Clinic Level of Care Assessment Details Patient Name: Date of Service: [name] [facility] 02/25/2023 8:45 A M Medical Record Number: [id number] Patient Account Number: [id number] Date of Birth/Sex: Treating RN: Apr 26, 1956 (67 y.o. [name]) [name] Primary Care Sumayya Muha: Dione Booze Other Clinician: Referring Antwonette Feliz: Treating Webster Patrone/Extender: RO BSO N, MICHA EL Della Goo in Treatment: 65 Clinic Level of Care Assessment Items TOOL 1 Quantity Score []  - 0 Use when EandM and Procedure is performed on INITIAL visit ASSESSMENTS - Nursing Assessment / Reassessment []  - 0 General Physical Exam (combine w/ comprehensive assessment (listed just below) when performed on new pt. evals) []  - 0 Comprehensive Assessment (HX, ROS, Risk Assessments, Wounds Hx, etc.) [place], [name] ([id number]) [id number] Page 2 of 9 ASSESSMENTS - Wound and Skin Assessment / Reassessment []  - 0 Dermatologic / Skin Assessment (not related to wound area) ASSESSMENTS - Ostomy and/or Continence Assessment and Care []  - 0 Incontinence Assessment and Management []  - 0 Ostomy Care Assessment and Management (repouching, etc.) PROCESS - Coordination of Care []  - 0 Simple Patient / Family Education for ongoing care []  - 0 Complex (extensive) Patient / Family Education for ongoing care []  - 0 Staff obtains Chiropractor, Records, T Results / Process Orders est []  - 0 Staff telephones HHA, Nursing Homes / Clarify orders / etc []  - 0 Routine Transfer to another Facility (non-emergent condition) []  - 0 Routine Hospital Admission (non-emergent condition) []  - 0 New Admissions / Manufacturing engineer / Ordering NPWT Apligraf, etc. , []  - 0 Emergency Hospital Admission (emergent condition) PROCESS - Special Needs []  - 0 Pediatric / Minor Patient Management []  - 0 Isolation Patient Management []  - 0 Hearing / Language / Visual special needs []  - 0 Assessment of Community assistance (transportation, D/C planning, etc.) []  - 0 Additional assistance / Altered mentation []  - 0 Support Surface(s) Assessment (bed, cushion, seat, etc.) INTERVENTIONS - Miscellaneous []  - 0 External ear exam []  - 0 Patient Transfer (multiple staff / Nurse, adult / Similar devices) []  - 0 Simple Staple / Suture removal (25 or less) []  - 0 Complex Staple /  Suture removal (26 or more) []  - 0 Hypo/Hyperglycemic Management (do not check if billed separately) []  - 0 Ankle / Brachial Index (ABI) - do not check  if billed separately Has the patient been seen at the hospital within the last three years: Yes Total Score: 0 Level Of Care: ____ Electronic Signature(s) Signed: 02/28/2023 11:58:03 AM By: [name] RN Entered By: [name] on 02/25/2023 06:21:06 -------------------------------------------------------------------------------- Encounter Discharge Information Details Patient Name: Date of Service: [name] [facility] 02/25/2023 8:45 A M Medical Record Number: [id number] Patient Account Number: [id number] Date of Birth/Sex: Treating RN: [birth year] (67 y.o. [name] Primary Care Jamil Castillo: Dione Booze Other Clinician: Referring Kamia Insalaco: Treating Darionna Banke/Extender: Chauncey Mann, MICHA EL Della Goo in Treatment: [address], [place] ([id number]) [id number] Page 3 of 9 Encounter Discharge Information Items Post Procedure Vitals Discharge Condition: Stable Temperature (F): 97.8 Ambulatory Status: Ambulatory Pulse (bpm): 77 Discharge Destination: Home Respiratory Rate (breaths/min): 18 Transportation: Private Auto Blood Pressure (mmHg): 152/85 Accompanied By: self Schedule Follow-up Appointment: Yes Clinical Summary of Care: Electronic Signature(s) Signed: 02/28/2023 11:58:03 AM By: [name] RN Entered By: [name] on 02/25/2023 06:23:04 -------------------------------------------------------------------------------- Lower Extremity Assessment Details Patient Name: Date of Service: [name] [facility] 02/25/2023 8:45 A M Medical Record Number: [id number] Patient Account Number: [id number] Date of Birth/Sex: Treating RN: 1956-04-13 (67 y.o. [name]) [name] Primary Care Jamilynn Whitacre: Dione Booze Other Clinician: Referring Magalie Almon: Treating  [name]/Extender: RO BSO Dorris Carnes, MICHA EL Della Goo in Treatment: 65 Electronic Signature(s) Signed: 02/28/2023 11:58:03 AM By: [name] RN Entered By: [name] on 02/25/2023 06:13:19 -------------------------------------------------------------------------------- Multi Wound Chart Details Patient Name: Date of Service: [name] [facility] 02/25/2023 8:45 A M Medical Record Number: [id number] Patient Account Number: [id number] Date of Birth/Sex: Treating RN: 08/28/1955 (66 y.o. [name]) [name] Primary Care Amando Chaput: Dione Booze Other Clinician: Referring Laster Appling: Treating Kortnee Bas/Extender: RO BSO N, MICHA EL Della Goo in Treatment: 65 Vital Signs Height(in): 66 Pulse(bpm): 77 Weight(lbs): 279 Blood Pressure(mmHg): 152/85 Body Mass Index(BMI): 45 Temperature(F): 97.8 Respiratory Rate(breaths/min): 18 [5:Photos:] Suchy, Ranvir ([id number]) [5:Photos:] [[id number] Page 4 of 9 N/A] Right Calcaneus Sacrum N/A Wound Location: Pressure Injury Pressure Injury N/A Wounding Event: Pressure Ulcer Pressure Ulcer N/A Primary Etiology: Anemia, Sleep Apnea, Coronary Artery Anemia, Sleep Apnea, Coronary Artery N/A Comorbid History: Disease, Deep Vein Thrombosis, Disease, Deep Vein Thrombosis, Hypertension, Type II Diabetes, Hypertension, Type II Diabetes, History of pressure wounds, History of pressure wounds, Neuropathy Neuropathy 07/01/2021 07/01/2021 N/A Date Acquired: [age] N/A Weeks of Treatment: Open Open N/A Wound Status: No No N/A Wound Recurrence: 2.2x2x0.3 2x2x4 N/A Measurements L x W x D (cm) 3.456 3.142 N/A A (cm) : rea 1.037 12.566 N/A Volume (cm) : 16.50% 0.00% N/A % Reduction in A rea: -150.50% -33.30% N/A % Reduction in Volume: Category/Stage III Category/Stage IV N/A Classification: Medium Medium N/A Exudate A mount: Serosanguineous Serosanguineous N/A Exudate Type: red,  brown red, brown N/A Exudate Color: Medium (34-66%) Large (67-100%) N/A Granulation A mount: Red Red N/A Granulation Quality: Medium (34-66%) None Present (0%) N/A Necrotic A mount: Fascia: No Fat Layer (Subcutaneous Tissue): Yes N/A Exposed Structures: Fat Layer (Subcutaneous Tissue): No Fascia: No Tendon: No Tendon: No Muscle: No Muscle: No Joint: No Joint: No Bone: No Bone: No Large (67-100%) None N/A Epithelialization: Treatment Notes Electronic Signature(s) Signed: 02/28/2023 11:58:03 AM By: [name] RN Entered By: [name] on 02/25/2023 06:13:24 -------------------------------------------------------------------------------- Multi-Disciplinary Care Plan Details Patient Name: Date of Service: [name] [facility] 02/25/2023 8:45 A M Medical Record Number: [id number] Patient Account Number: [id number] Date of Birth/Sex: Treating RN: 1955/11/30 (66 y.o. [name]) [name] Primary Care  Khira Cudmore: Dione Booze Other Clinician: Referring Sheridyn Canino: Treating Dreama Kuna/Extender: RO BSO N, MICHA EL Della Goo in Treatment: 62 Active Inactive Wound/Skin Impairment Nursing Diagnoses: Knowledge deficit related to ulceration/compromised skin integrity Cogle, [name] ([id number]) [id number] Page 5 of 9 Goals: Patient/caregiver will verbalize understanding of skin care regimen Date Initiated: 11/22/2021 Target Resolution Date: 03/26/2023 Goal Status: Active Ulcer/skin breakdown will have a volume reduction of 30% by week 4 Date Initiated: 11/22/2021 Date Inactivated: 01/10/2022 Target Resolution Date: 12/23/2021 Goal Status: Unmet Unmet Reason: comorbities Ulcer/skin breakdown will have a volume reduction of 50% by week 8 Date Initiated: 11/22/2021 Date Inactivated: 05/07/2022 Target Resolution Date: 01/22/2022 Goal Status: Unmet Unmet Reason: comorbidities Ulcer/skin breakdown will have a volume reduction of 80% by  week 12 Date Initiated: 11/22/2021 Date Inactivated: 05/07/2022 Target Resolution Date: 02/22/2022 Goal Status: Unmet Unmet Reason: comorbidities Ulcer/skin breakdown will heal within 14 weeks Date Initiated: 11/22/2021 Date Inactivated: 05/07/2022 Target Resolution Date: 03/25/2022 Goal Status: Unmet Unmet Reason: comorbidities Interventions: Assess patient/caregiver ability to obtain necessary supplies Assess patient/caregiver ability to perform ulcer/skin care regimen upon admission and as needed Assess ulceration(s) every visit Notes: Electronic Signature(s) Signed: 02/28/2023 11:58:03 AM By: [name] RN Entered By: [name] on 02/25/2023 06:13:52 -------------------------------------------------------------------------------- Pain Assessment Details Patient Name: Date of Service: [name] [facility] 02/25/2023 8:45 A M Medical Record Number: [id number] Patient Account Number: [id number] Date of Birth/Sex: Treating RN: 12/20/1955 (66 y.o. [name] Primary Care Marisal Swarey: Dione Booze Other Clinician: Referring Barri Neidlinger: Treating Jvon Meroney/Extender: RO BSO N, MICHA EL Della Goo in Treatment: 65 Active Problems Location of Pain Severity and Description of Pain Patient Has Paino No Site Locations Pain Management and Medication Paster, [name] ([id number]) [id number] Page 6 of 9 Current Pain Management: Electronic Signature(s) Signed: 02/28/2023 11:58:03 AM By: [name] RN Entered By: [name] on 02/25/2023 06:11:06 -------------------------------------------------------------------------------- Patient/Caregiver Education Details Patient Name: Date of Service: [name] [facility] 8/27/2024andnbsp8:45 A M Medical Record Number: [id number] Patient Account Number: [id number] Date of Birth/Gender: Treating RN: 1956/03/19 (66 y.o. [name]) [name] Primary Care Physician: Dione Booze Other  Clinician: Referring Physician: Treating Physician/Extender: RO BSO Dorris Carnes, MICHA EL Della Goo in Treatment: 1 Education Assessment Education Provided To: Patient Education Topics Provided Wound/Skin Impairment: Handouts: Caring for Your Ulcer Methods: Explain/Verbal Responses: State content correctly Electronic Signature(s) Signed: 02/28/2023 11:58:03 AM By: [name] RN Entered By: [name] on 02/25/2023 06:14:05 -------------------------------------------------------------------------------- Wound Assessment Details Patient Name: Date of Service: [name] [facility] 02/25/2023 8:45 A M Medical Record Number: [id number] Patient Account Number: [id number] Date of Birth/Sex: Treating RN: 1956-05-11 (66 y.o. [name] Primary Care Nana Hoselton: Dione Booze Other Clinician: Referring Rily Nickey: Treating Dorothy Polhemus/Extender: RO BSO N, MICHA EL Donetta Potts Weeks in Treatment: 65 Wound Status Wound Number: 5 Primary Pressure Ulcer Etiology: Wound Location: Right Calcaneus Wound Open Franchini, [name] ([id number]) [id number] Page 7 of 9 Wound Open Wounding Event: Pressure Injury Status: Date Acquired: 07/01/2021 Comorbid Anemia, Sleep Apnea, Coronary Artery Disease, Deep Vein Weeks Of Treatment: 65 History: Thrombosis, Hypertension, Type II Diabetes, History of pressure Clustered Wound: No wounds, Neuropathy Photos Wound Measurements Length: (cm) 2.2 Width: (cm) 2 Depth: (cm) 0.3 Area: (cm) 3.456 Volume: (cm) 1.037 % Reduction in Area: 16.5% % Reduction in Volume: -150.5% Epithelialization: Large (67-100%) Tunneling: No Undermining: No Wound Description Classification: Category/Stage III Exudate Amount: Medium Exudate Type: Serosanguineous Exudate Color: red, brown Foul Odor After Cleansing: No Slough/Fibrino Yes Wound Bed Granulation Amount: Medium (34-66%) Exposed  Structure Granulation Quality:  Red Fascia Exposed: No Necrotic Amount: Medium (34-66%) Fat Layer (Subcutaneous Tissue) Exposed: No Necrotic Quality: Adherent Slough Tendon Exposed: No Muscle Exposed: No Joint Exposed: No Bone Exposed: No Treatment Notes Wound #5 (Calcaneus) Wound Laterality: Right Cleanser Normal Saline Discharge Instruction: Wash your hands with soap and water. Remove old dressing, discard into plastic bag and place into trash. Cleanse the wound with Normal Saline prior to applying a clean dressing using gauze sponges, not tissues or cotton balls. Do not scrub or use excessive force. Pat dry using gauze sponges, not tissue or cotton balls. Soap and Water Discharge Instruction: Gently cleanse wound with antibacterial soap, rinse and pat dry prior to dressing wounds Peri-Wound Care Topical Primary Dressing Hydrofera Blue Ready Transfer Foam, 2.5x2.5 (in/in) Discharge Instruction: cut to size of wound Secondary Dressing Gauze Discharge Instruction: As directed: dry, moistened with saline or moistened with Dakins Solution Secured With Medipore T - 40M Medipore H Soft Cloth Surgical T ape ape, 2x2 (in/yd) Compression Wrap Compression Stockings Add-Ons Electronic Signature(s) [place], [name] ([id number]) [id number] Page 8 of 9 Signed: 02/28/2023 11:58:03 AM By: [name] RN Entered By: [name] on 02/25/2023 06:12:40 -------------------------------------------------------------------------------- Wound Assessment Details Patient Name: Date of Service: [name] [facility] 02/25/2023 8:45 A M Medical Record Number: [id number] Patient Account Number: [id number] Date of Birth/Sex: Treating RN: [birth year] (66 y.o. [name]) [name] Primary Care Lyanna Blystone: Dione Booze Other Clinician: Referring Teige Rountree: Treating Giamarie Bueche/Extender: RO BSO N, MICHA EL Della Goo in Treatment: 65 Wound Status Wound Number: 7 Primary Pressure  Ulcer Etiology: Wound Location: Sacrum Wound Open Wounding Event: Pressure Injury Status: Date Acquired: 07/01/2021 Comorbid Anemia, Sleep Apnea, Coronary Artery Disease, Deep Vein Weeks Of Treatment: 65 History: Thrombosis, Hypertension, Type II Diabetes, History of pressure Clustered Wound: No wounds, Neuropathy Photos Wound Measurements Length: (cm) 2 Width: (cm) 2 Depth: (cm) 4 Area: (cm) 3.142 Volume: (cm) 12.566 % Reduction in Area: 0% % Reduction in Volume: -33.3% Epithelialization: None Tunneling: No Undermining: No Wound Description Classification: Category/Stage IV Exudate Amount: Medium Exudate Type: Serosanguineous Exudate Color: red, brown Foul Odor After Cleansing: No Slough/Fibrino Yes Wound Bed Granulation Amount: Large (67-100%) Exposed Structure Granulation Quality: Red Fascia Exposed: No Necrotic Amount: None Present (0%) Fat Layer (Subcutaneous Tissue) Exposed: Yes Tendon Exposed: No Muscle Exposed: No Joint Exposed: No Bone Exposed: No Treatment Notes Wound #7 (Sacrum) Bogdanski, [name] ([id number]) [id number] Page 9 of 9 Cleanser Normal Saline Discharge Instruction: Wash your hands with soap and water. Remove old dressing, discard into plastic bag and place into trash. Cleanse the wound with Normal Saline prior to applying a clean dressing using gauze sponges, not tissues or cotton balls. Do not scrub or use excessive force. Pat dry using gauze sponges, not tissue or cotton balls. Soap and Water Discharge Instruction: Gently cleanse wound with antibacterial soap, rinse and pat dry prior to dressing wounds Peri-Wound Care Topical calmoseptine Discharge Instruction: apply to excoriated areas / periwound Primary Dressing Hydrofera Blue Ready Transfer Foam, 4x5 (in/in) Discharge Instruction: Apply Hydrofera Blue Ready to wound bed as directed Secondary Dressing ABD Pad 5x9 (in/in) Discharge Instruction: Cover  with ABD pad Secured With Medipore T - 40M Medipore H Soft Cloth Surgical T ape ape, 2x2 (in/yd) Compression Wrap Compression Stockings Add-Ons Electronic Signature(s) Signed: 02/28/2023 11:58:03 AM By: [name] RN Entered By: [name] on 02/25/2023 06:13:06 -------------------------------------------------------------------------------- Vitals Details Patient Name: Date of Service: [name] [facility] 02/25/2023 8:45 A M Medical Record Number: [id number] Patient  Account Number: [id number] Date of Birth/Sex: Treating RN: [birth year] (66 y.o. [name]) [name] Primary Care Armonte Tortorella: Dione Booze Other Clinician: Referring Chaia Ikard: Treating Antaniya Venuti/Extender: RO BSO N, MICHA EL Della Goo in Treatment: 65 Vital Signs Time Taken: 09:10 Temperature (F): 97.8 Height (in): 66 Pulse (bpm): 77 Weight (lbs): 279 Respiratory Rate (breaths/min): 18 Body Mass Index (BMI): 45 Blood Pressure (mmHg): 152/85 Reference Range: 80 - 120 mg / dl Electronic Signature(s) Signed: 02/28/2023 11:58:03 AM By: [name] RN Entered By: [name] on 02/25/2023 06:10:56

## 2023-02-28 NOTE — Progress Notes (Signed)
[facility] Progress Note  [name]    YQM:[id number]  DOA: 02/26/2023     Date of Service: the patient was seen and examined on 02/28/2023  Chief Complaint  Patient presents with   Hematuria   Hematemesis   Brief hospital course: oseph Puff is a 67 y.o. male with medical history significant of  bipolar disorder, seizure disorder, type 2 diabetes, TBI, neurogenic bladder with chronic indwelling Foley, obesity, type 2 diabetes, chronic diastolic CHF, stage III CKD presenting with severe sepsis, UTI.  Limited history in the setting of TBI.  Per report, patient with hematuria at home.  Patient new Foley catheter placed on 02/26/23 around 2 PM with family reporting bloody output afterwards.  Patient reported of groin pain.  Positive generalized malaise, weakness fatigue. Mild generalized abdominal pain. Bloody hematemesis at home. No any other complaints. ED w/up: Tmax 102.8, HR 100s, blood pressure 80s to 100s over 40s to 50s.  Satting well on RA.  wbc 4.6, Hb 10.2, plts 140, lactate 2.7-2.9.  Creatinine 2.47, bicarb 19, potassium 5.9. EKG w/ reversed leads, but no peaked T waves.   CT with mild right hydronephrosis as well as malpositioned Foley catheter with tip in the region of the prostatic urethra.  Assessment and Plan:  # Sepsis secondary to complicated UTI, patient has chronic indwelling Foley catheter. Klebsiella pneumonia bacteremia, positive blood culture S/p IV fluid given for sepsis, blood pressure improved.  Vital signs stable. Lactic acidosis resolved Procalcitonin elevated WBC count within normal range  # Complicated UTI, chronic Foley catheter due to neurogenic bladder Foley catheter was replaced in the ED on 8/29 Blood culture growing Klebsiella pneumonia Follow urine culture <10k insufficient growth S/p cefepime, de-escalated to ceftriaxone Follow ID consult for further recommendation   # Hypokalemia, potassium improved. #  Hyponatremia, monitor sodium level Monitor electrolytes daily   # Type II diabetes mellitus with renal manifestations  SSI A1c   Dyslipidemia: Statin   Essential hypertension HOLD BP regimen in setting of sepsis and hypotension      (HFpEF) heart failure with preserved ejection fraction (HCC) 2D ECHO 08/2021 w/ EF 60-65%, grade 3 diastolic dysfunction  Euvolemic to dry on presentation in setting of sepsis  Monitor volume status closely w/ IVF  Strict Is and Os and daily weights  Follow    CKD (chronic kidney disease), stage III (HCC) Baseline creatinine around 2-2.6 with GFR in the upper 20s to 30s Creatinine 2.47 with GFR in the upper 20s Monitor Minimize nephrotoxic agents Restart oral bicarb in the setting of concomitant metabolic acidosis   Hyperkalemia, resolved Potassium 5.9 on presentation Status post Lokelma No peaked T waves on EKG Monitor electrolytes daily     Seizure prophylaxis Cont trileptal    Colostomy in place Appears stable in setting of sepsis  Wound/ostomy consult      Anemia of chronic disease Baseline hgb 8-10  Hgb 10  --9.1 could be dilutional Follow CBC daily     History of DVT (deep vein thrombosis) Cont eliquis, resume at night on 8/30 Monitor hematuria   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  Interventions:  Pressure Injury 02/23/22 Buttocks Bilateral Unstageable - Full thickness tissue loss in which the base of the injury is covered by slough (yellow, tan, gray, green or brown) and/or eschar (tan, brown or black) in the wound bed. Reddened white area around (Active)  02/23/22 1515  Location: Buttocks  Location  Orientation: Bilateral  Staging: Unstageable - Full thickness tissue loss in which the base of the injury is covered by slough (yellow, tan, gray, green or brown) and/or eschar (tan, brown or black) in the wound bed.  Wound Description  (Comments): Reddened white area around the wound. Wound in deep, unstageable, and tunnels.  Present on Admission: Yes  Dressing Type ABD 02/28/23 0738     Pressure Injury Heel Right (Active)     Location: Heel  Location Orientation: Right  Staging:   Wound Description (Comments):   Present on Admission: Yes  Dressing Type Foam - Lift dressing to assess site every shift 02/28/23 0738     Diet: Heart healthy carb modified diet DVT Prophylaxis: Therapeutic Anticoagulation with Eliquis    Advance goals of care discussion: Full code  Family Communication: family was not present at bedside, at the time of interview.  The pt provided permission to discuss medical plan with the family. Opportunity was given to ask question and all questions were answered satisfactorily.   Disposition:  Pt is from Home, admitted with sepsis UTI, still on IV Abx, which precludes a safe discharge. Discharge to Home, when stable.  Subjective: No significant events overnight, patient denies any chest pain or palpitation no shortness of breath, no abdominal pain.  Patient was laying comfortably,  Physical Exam: General: NAD, lying comfortably Appear in no distress, affect appropriate Eyes: PERRLA ENT: Oral Mucosa Clear, moist  Neck: no JVD,  Cardiovascular: S1 and S2 Present, no Murmur,  Respiratory: good respiratory effort, Bilateral Air entry equal and Decreased, no Crackles, no wheezes Abdomen: Bowel Sound present, Soft and no tenderness,  Skin: no rashes Extremities: 2+ Pedal edema, no calf tenderness, edema of b/l Arm Left >Right Neurologic: without any new focal findings Gait not checked due to patient safety concerns  Vitals:   02/28/23 0500 02/28/23 0600 02/28/23 0800 02/28/23 1000  BP: 117/68 125/78 (!) 155/83 (!) 145/80  Pulse: 65 63 67 71  Resp: 17 13 13 14   Temp:   98 F (36.7 C)   TempSrc:   Oral   SpO2: 98% 97% 98% 99%  Weight:      Height:        Intake/Output Summary (Last 24  hours) at 02/28/2023 1554 Last data filed at 02/28/2023 0750 Gross per 24 hour  Intake --  Output 2500 ml  Net -2500 ml   Filed Weights   02/26/23 2335 02/27/23 1032 02/28/23 0407  Weight: 125.4 kg 125.5 kg 126.7 kg    Data Reviewed: I have personally reviewed and interpreted daily labs, tele strips, imagings as discussed above. I reviewed all nursing notes, pharmacy notes, vitals, pertinent old records I have discussed plan of care as described above with RN and patient/family.  CBC: Recent Labs  Lab 02/27/23 0006 02/28/23 0304  WBC 4.6 8.2  NEUTROABS 3.9  --   HGB 10.2* 9.1*  HCT 33.3* 29.3*  MCV 87.9 85.9  PLT 140* 117*   Basic Metabolic Panel: Recent Labs  Lab 02/27/23 0006 02/27/23 0903 02/27/23 1815 02/28/23 0304  NA 134* 131*  --  132*  K 5.9* 5.4* 4.6 5.0  CL 107 110  --  105  CO2 19* 18*  --  20*  GLUCOSE 176* 85  --  93  BUN 43* 39*  --  36*  CREATININE 2.47* 2.39*  --  2.43*  CALCIUM 8.2* 7.6*  --  8.1*  MG  --   --   --  1.7  Studies: No results found.  Scheduled Meds:  apixaban  5 mg Oral BID   Chlorhexidine Gluconate Cloth  6 each Topical Daily   insulin aspart  0-9 Units Subcutaneous TID WC   insulin aspart  3 Units Subcutaneous TID WC   memantine  10 mg Oral BID   Oxcarbazepine  300 mg Oral BID   rosuvastatin  20 mg Oral Daily   sodium bicarbonate  650 mg Oral BID   Continuous Infusions:  cefTRIAXone (ROCEPHIN)  IV 2 g (02/28/23 1234)   PRN Meds: acetaminophen, ondansetron **OR** ondansetron (ZOFRAN) IV, mouth rinse, QUEtiapine  Time spent: 55 minutes  Author: Gillis Santa. MD [facility] 02/28/2023 3:54 PM  To reach On-call, see care teams to locate the attending and reach out to them via [email or web address]. If 7PM-7AM, please contact night-coverage If you still have difficulty reaching the attending provider, please page the [facility] (Director on Call) for [facility] on amion for assistance.

## 2023-02-28 NOTE — Consult Note (Signed)
  Consultation Note Date: 02/28/2023   Patient Name: [name]  DOB: Nov 01, 1955  MRN: [id number]  Age / Sex: 67 y.o., male  PCP: Dione Booze, MD Referring Physician: Gillis Santa, MD  Reason for Consultation: Establishing goals of care   HPI/Brief Hospital Course: 67 y.o. 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   Admitted and being treated for UTI and severe sepsis, catheter replaced on 8/29 in ED  Noted admit 08/2022 for similar issues, Ms. Lefever familiar to PMT services as he was followed during previous admissions  Palliative care has been consulted to assist with goals of care conversations.   Subjective:  Extensive chart review has been completed prior to meeting patient including labs, vital signs, imaging, progress notes, orders, and available advanced directive documents from current and previous encounters.  Visited with Ms. Nair at his bedside. Awake, alert and able to engage in conversations. He is able to recall our previous visits and conversations from previous hospitalization.  Introduced myself as a Publishing rights manager as a member of the palliative care team. Explained palliative medicine is specialized medical care for people living with serious illness. It focuses on providing relief from the symptoms and stress of a serious illness. The goal is to improve quality of life for both the patient and the family.   Mr. Patrice able to share he experienced hematuria at home after his nurse exchanged his catheter. Aware he was admitted and being treated for UTI. I discussed severity of  UTI-complicated and treatment for sepsis.  He denies pain or discomfort, reports sleeping well overnight and enjoyed his breakfast.  Attempted to elicit goals of care. Discussed code status, Full Code versus Do Not Resuscitate. As during previous admission, Mr. Daza clear he wishes to remain Full Code and is accepting of any and all offered interventions moving forward.  Will attempt to meet with wife over weekend to continue conversations.  All questions/concerns addressed. PMT will continue to follow and support patient as needed.  Discussed With: Nursing staff   Thank you for this consult and allowing Palliative Medicine to participate in the care of [facility]. Palliative medicine will continue to follow and assist as needed.   Time Total: 40 minutes  Time spent includes: Detailed review of medical records (labs, imaging, vital signs), medically appropriate exam (mental status, respiratory, cardiac, skin), discussed with treatment team, counseling and educating patient, family and staff, documenting clinical information, medication management and coordination of care.   Signed by: Leeanne Deed, DNP, AGNP-C Palliative Medicine    Please contact Palliative Medicine Team phone at [phone] for questions and concerns.  For individual provider: See Loretha Stapler

## 2023-02-28 NOTE — TOC Initial Note (Signed)
Transition of Care [facility]) - Initial/Assessment Note    Patient Details  Name: [name] MRN: [id number] Date of Birth: Jun 26, 1956  Transition of [facility] CM/SW Contact:    Kreg Shropshire, RN Phone Number: 02/28/2023, 11:20 AM  Clinical Narrative:                  Pt arrived from ED from: Home Caregiver Support: Lives with wife DME at Home: Systems analyst: Family Previous Services: pt stated he was active [employer] but not sure of the name HH/SNF Preference: None First Person of Contact: Spouse [name] PCP: Jene Every, MD  Readmission Prevention Screening Completed  Cm will continue to follow for toc needs and d/c planning.         Patient Goals and CMS Choice   CMS Medicare.gov Compare Post Acute Care list provided to:: Patient Choice offered to / list presented to : Patient      Expected Discharge Plan and Services       Living arrangements for the past 2 months: Single Family Home                                      Prior Living Arrangements/Services Living arrangements for the past 2 months: Single Family Home Lives with:: Self, Spouse          Need for Family Participation in Patient Care: Yes (Comment) Care giver support system in place?: Yes (comment)      Activities of Daily Living Home Assistive Devices/Equipment: Ostomy supplies, Wheelchair, Medical laboratory scientific officer (specify quad or straight), CBG Meter, Eyeglasses, Walker (specify type) ADL Screening (condition at time of admission) Patient's cognitive ability adequate to safely complete daily activities?: Yes Is the patient deaf or have difficulty hearing?: No Does the patient have difficulty seeing, even when wearing glasses/contacts?: No Does the patient have difficulty concentrating, remembering, or making decisions?: Yes Patient able to express need for assistance with ADLs?: Yes Does the patient have difficulty dressing or bathing?: Yes Independently performs ADLs?:  No Communication: Independent Dressing (OT): Needs assistance Is this a change from baseline?: Pre-admission baseline Grooming: Independent Feeding: Independent Bathing: Needs assistance Is this a change from baseline?: Pre-admission baseline Toileting: Needs assistance Is this a change from baseline?: Pre-admission baseline In/Out Bed: Needs assistance Is this a change from baseline?: Pre-admission baseline Walks in Home: Needs assistance Is this a change from baseline?: Pre-admission baseline Does the patient have difficulty walking or climbing stairs?: Yes Weakness of Legs: Both Weakness of Arms/Hands: Both  Permission Sought/Granted                  Emotional Assessment Appearance:: Appears stated age Attitude/Demeanor/Rapport: Engaged Affect (typically observed): Calm Orientation: : Oriented to Self, Oriented to Place, Oriented to  Time, Oriented to Situation      Admission diagnosis:  Sepsis (HCC) [A41.9] Severe sepsis (HCC) [A41.9, R65.20] Urinary tract infection associated with indwelling urethral catheter, initial encounter (HCC) [W09.811B, N39.0] Chronic kidney disease, unspecified CKD stage [N18.9] Sepsis due to gram-negative UTI (HCC) [A41.50, N39.0] Patient Active Problem List   Diagnosis Date Noted   CKD (chronic kidney disease), stage III (HCC) 02/27/2023   (HFpEF) heart failure with preserved ejection fraction (HCC) 02/27/2023   Anemia of chronic kidney failure, stage 3 (moderate) (HCC) 09/24/2022   Gross hematuria 09/22/2022   Hydronephrosis of right kidney 09/20/2022   Metabolic acidosis 09/20/2022   Hyperkalemia 09/19/2022  Mass of urinary bladder 09/19/2022   Sepsis due to gram-negative UTI (HCC) 03/20/2022   Type 2 diabetes mellitus without complications (HCC) 03/20/2022   Dyslipidemia 03/20/2022   Complicated UTI (urinary tract infection) 02/22/2022   Acute renal failure superimposed on stage 3b chronic kidney disease (HCC) 02/22/2022    Acute on chronic diastolic CHF (congestive heart failure) (HCC) 02/22/2022   Obesity with body mass index of 30.0-39.9 02/22/2022   Cellulitis of left thigh 02/22/2022   Essential hypertension 01/13/2022   Seizure prophylaxis 01/13/2022   Acute pyelonephritis 01/05/2022   Fever 12/16/2021   Hypotension 10/09/2021   Abdominal distension 10/08/2021   AKI (acute kidney injury) (HCC) 10/08/2021   Sacral decubitus ulcer 10/07/2021   MSSA bacteremia 10/07/2021   Severe sepsis (HCC) 10/06/2021   Sepsis secondary to UTI (HCC) 07/28/2021   COVID-19 virus infection 07/28/2021   Chronic indwelling Foley catheter 06/22/2021   Colostomy in place [facility]) 06/22/2021   TBI (traumatic brain injury) (HCC)    Healed ulcer of foot on examination 04/14/2021   Acute UTI 02/23/2021   Osteomyelitis of vertebra, sacral and sacrococcygeal region [facility]) 10/18/2020   Hyponatremia 07/07/2020   Encephalopathy 07/07/2020   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) 07/07/2020   Anemia of chronic disease 07/07/2020   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) 06/01/2020   Sacral osteomyelitis (HCC) 04/13/2020   Urinary retention 03/19/2020   History of DVT (deep vein thrombosis) 03/19/2020   Pressure injury of sacral region, stage 4 (HCC) 03/19/2020   Severe sepsis with septic shock (HCC) 03/18/2020   Obesity, Class III, BMI 40-49.9 (morbid obesity) (HCC) 03/18/2020   Cellulitis 03/18/2020   Acute cystitis without hematuria    Stage 3b chronic kidney disease (CKD) (HCC) - baseline SCr 1.8-1.9 01/20/2020   Decubitus ulcers 01/17/2020   Sepsis (HCC) 01/16/2020   Cellulitis of sacral region 01/16/2020   Acute kidney injury superimposed on chronic kidney disease (HCC) 01/16/2020   Acute metabolic encephalopathy 01/16/2020   Bipolar disorder, in full remission, most recent episode mixed (HCC) 12/29/2019   High risk medication use 10/25/2019   Noncompliance with  treatment plan 10/25/2019   Bipolar I disorder, most recent episode mixed (HCC) 01/07/2019   GAD (generalized anxiety disorder) 01/07/2019   Insomnia due to medical condition 01/07/2019   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) 03/06/2016   Bipolar disorder (HCC) 01/03/2016   Lithium toxicity 01/02/2016   Chronic anticoagulation - on Eliquis 07/06/2014   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/06/2014   Hyperlipidemia 07/06/2014   Leg swelling 07/06/2014   Obesity, Class II, BMI 35-39.9, with comorbidity 07/06/2014   On medication for venous thromboembolism 07/06/2014   Sleep apnea 07/06/2014   Type II diabetes mellitus with renal manifestations (HCC) 07/06/2014   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   Venous thromboembolism (VTE) 07/06/2014   PCP:  Dione Booze, MD Pharmacy:   [facility] [address] [place] - [address] [place] [address] Phone: [phone] Fax: [phone]     Social Determinants of Health (SDOH) Social History: SDOH Screenings   Food Insecurity: No Food Insecurity (02/27/2023)  Housing: Low Risk  (02/27/2023)  Transportation Needs: No Transportation Needs (02/27/2023)  Utilities: Not At Risk (02/27/2023)  Depression (PHQ2-9): Medium Risk (12/04/2022)  Financial Resource Strain: Low Risk  (12/31/2020)   Received from [facility]  Physical Activity: Inactive (09/09/2017)  Social Connections: Unknown (09/09/2017)  Stress:  No Stress Concern Present (09/09/2017)  Tobacco Use: Low Risk  (02/27/2023)   SDOH Interventions:     Readmission Risk Interventions    03/23/2022   12:09 PM 02/23/2022    3:44 PM 01/14/2022    3:23 PM  Readmission Risk Prevention Plan  Transportation Screening Complete Complete Complete  Medication Review Oceanographer) Complete  Complete  PCP or Specialist appointment within 3-5 days of discharge Complete Complete    HRI or Home Care Consult Complete Complete Complete  SW Recovery Care/Counseling Consult Complete Complete   Palliative Care Screening Not Applicable Not Applicable Not Applicable  Skilled Nursing Facility Not Applicable Not Applicable

## 2023-02-28 NOTE — Progress Notes (Signed)
NAZAIR, GRODIN ([id number]) [id number] Page 1 of 11 Visit Report for 02/25/2023 Debridement Details Patient Name: Date of Service: Ena Dawley [facility] 02/25/2023 8:45 A M Medical Record Number: [id number] Patient Account Number: [id number] Date of Birth/Sex: Treating RN: [birth year] (66 y.o. [name]) [name] Primary Care Provider: Dione Booze Other Clinician: Referring Provider: Treating Provider/Extender: RO BSO Dorris Carnes, MICHA EL Della Goo in Treatment: 65 Debridement Performed for Assessment: Wound #5 Right Calcaneus Performed By: Physician Maxwell Caul, MD Debridement Type: Debridement Level of Consciousness (Pre-procedure): Awake and Alert Pre-procedure Verification/Time Out Yes - 09:15 Taken: Start Time: 09:15 Percent of Wound Bed Debrided: 100% T Area Debrided (cm): otal 3.45 Tissue and other material debrided: Viable, Non-Viable, Eschar, Slough, Slough Level: Non-Viable Tissue Debridement Description: Selective/Open Wound Instrument: Curette Bleeding: Moderate Hemostasis Achieved: Pressure End Time: 09:20 Procedural Pain: 0 Post Procedural Pain: 0 Response to Treatment: Procedure was tolerated well Level of Consciousness (Post- Awake and Alert procedure): Post Debridement Measurements of Total Wound Length: (cm) 2.2 Stage: Category/Stage III Width: (cm) 2 Depth: (cm) 0.3 Volume: (cm) 1.037 Character of Wound/Ulcer Post Debridement: Improved Post Procedure Diagnosis Same as Pre-procedure Electronic Signature(s) Signed: 02/25/2023 5:06:52 PM By: Baltazar Najjar MD Signed: 02/28/2023 11:58:03 AM By: [name] RN Entered By: [name] on 02/25/2023 06:20:36 Scully, [name] ([id number]) [id number] Page 2 of 11 -------------------------------------------------------------------------------- HPI Details Patient Name: Date of Service: Ena Dawley [facility] 02/25/2023 8:45  A M Medical Record Number: [id number] Patient Account Number: [id number] Date of Birth/Sex: Treating RN: Jun 19, 1956 (66 y.o. [name]) [name] Primary Care Provider: Dione Booze Other Clinician: Referring Provider: Treating Provider/Extender: RO BSO Dorris Carnes, MICHA EL Della Goo in Treatment: 59 History of Present Illness HPI Description: 05/29/2021 this is a patient who presents today for initial inspection here in the clinic concerning wounds that he has over the right heel and the sacral region. Unfortunately the sacral wound is starting to spread off to the right gluteal location due to how he sits always leaning towards the right side in his chair. His wife is present she is the primary caregiver though she is not home with him all the time she does have to work. She does do an excellent job however it appears to be in trying to keep things under good control for him. The patient is not able to change positions himself nor walk by himself so he is pretty much at the mercy of the position he is putting when she is gone and this tends to be his chair which she sits and most of the day. Obviously this I think is the main culprit for what is going on currently. It was actually in January 2020 when the sacral wound started. It was in September 2022 when the wound started to spread more to the right gluteal location. Subsequently in August 2022 is when he had been in a skilled nursing facility and the heel started to give him trouble as well. That does not seem to be doing nearly as poorly as the sacral region. He was hospitalized in October 2022 secondary to sepsis and this was in regard to the foot and was sent to skilled nursing again he is now back at home. He did have a wound VAC for the sacral wound over the summer 2022 but being in and out of facilities this ended up getting sent back. The patient does have [facility] home health that comes out 1 time per week to help with care. His  most recent hemoglobin A1c was 6.9 in August 2022. 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 s. 07/24/2021 upon evaluation today patient appears to be doing decently well in regard to the pressure ulcer on the right heel as well as the sacral region. In general I think you are making some progress here which is great news. Overall the heel unfortunately had already closed previously when we saw him although it apparently reopened when he was working with physical therapy according to his wife. The area in the sacral region is doing well and looks clean there is still some depth here but I still think it would be difficult to wound VAC this region. His wife does an awesome job taking care of him. Is been so long since we have seen him because he has been in the hospital to be honest. 08/07/2021 upon evaluation today patient appears to be doing well at this time. Fortunately I do not see any signs of active infection locally or systemically at this time which is great news. No fevers, chills, nausea, vomiting, or diarrhea. Unfortunately after I saw him on the 24th he actually ended up in the hospital in the 27th due to being septic. This was not due to the wounds but after looking at records actually due to a UTI. Fortunately he is doing much better and very happy in that regard. I do not see any signs of infection locally nor systemically at this time. 08/21/2021 upon evaluation today patient appears to be doing well with regard to his wound. Fortunately I think that the sacral region is doing decently well at this point which is great news. With regard to the foot this also does have some slough noted but I feel like we are making progress here. He does have some irritation around the right upper  thigh/gluteal region. I feel like it is more towards the thigh. Nonetheless this does appear to be pressure related he spends a lot of time sitting up his caregiver states he really will not get in the bed and stay there like he should. 09/04/2021 upon evaluation patient appears to be doing decently well today in regard to the wound on his heel as well as the sacral area. I am actually very pleased with both and how things are appearing currently. There does not appear to be any evidence of active infection I think that his caregiver is doing an awesome job with regard to the wound care here. She is present today as well and I did discuss this with her. 09/25/2021 upon evaluation today patient appears to be doing well with regard to his wound on the sacral region. His right heel is also doing well. Unfortunately he has a new area on his left heel which does appear to be pressure injury. I do not see any evidence of active infection locally or systemically which is great news no fevers, chills, nausea, vomiting, or diarrhea. Readmission: 11-22-2021 upon evaluation today patient presents for reevaluation here in the clinic concerning issues with his wounds. Since have last seen him he was in the hospital and then subsequently ended up in a skilled nursing facility. Subsequently period of time in the facility he actually had a breakdown in the right thigh location which has been an issue for him. Fortunately I do not see any evidence  of active infection locally or systemically which is great news and I am very pleased in that regard. Nonetheless he does have still the wounds on both heels as well as the wound in the sacral area and the new area in the right upper thigh/gluteal region. 12-13-2021 upon evaluation today patient appears to be doing well currently in regard to his wounds. The left heel is going to require some sharp debridement. Fortunately the right heel seems to be doing quite well. Overall I  think his gluteal region as well as sacral region also are doing well. 01-10-2022 upon evaluation today patient appears to be doing better in regard to some areas unfortunately his sacral area is not doing as well. It seems of gotten worse his caregiver states when he was in the hospital recently they really did not offloading. Fortunately I do not see any evidence of active infection locally or systemically at this time. 02-01-2022 upon evaluation today patient appears to be doing well currently in regard to his wounds. He is going require some sharp debridement of clearway some of the necrotic debris. Fortunately I do not see any evidence of active infection locally or systemically which is great news. No fevers, chills, nausea, vomiting, or diarrhea. 02-22-2022 upon evaluation today patient appears to be doing poorly in regard to his right heel the left heel is completely closed. Unfortunately I think he is got some deep tissue injury to this area I am going to do a culture at this spot. With that being said he is having 2 other significant issues in regard to the left thigh this is showing signs of cellulitis all the way down into the thigh and was seeing definite temperature difference in the left thigh versus the right thigh when feeling. It also is of note that the patient is also having some issues here with trouble in regard to having fevers his wife has been giving him Tylenol and ibuprofen to help keep the fevers under control but he has been as high as 102 this is pretty much been going on since Monday or Tuesday of this week. Nonetheless I am concerned I am not exactly sure where the cellulitis on the left thigh is coming from not sure if this is emanating from his sacral region that is a possibility or if there is something completely different going on either way with the fevers I am more worried that he needs to go get this checked out as soon as possible. 03-08-2022 upon evaluation today  patient appears to be doing well with regard to his heel as well as sacral region. Both are showing signs of significant improvement compared to the last time I saw him. Overall I think we are in a much better spot 04-04-2022 upon evaluation today patient appears to be doing about the same in regard to his wounds. I do not see any signs of improvement nor worsening at this point. Fortunately there is no evidence of active infection at this time. He is not staying off of this is much as he needs to in fact is not even sleeping in the hospital bed with the alternating air mattress. 04-16-2022 upon evaluation today patient appears to be doing well currently in regard to his wound. He has been tolerating the dressing changes without ANSIL, STRZALKA ([id number]) [id number] Page 3 of 11 complication. Fortunately I do not see any evidence of infection. The heel actually looks quite well the sacral area actually is still quite deep. From my  questioning and discussion with him today I do not believe that he is really staying off of this as much as he should. He seemed a little uncomfortable during that portion of the conversation today. I think that is contributing quite a bit to his lack of progress with regard to healing. 11/7; patient with decubitus ulcers on his right heel as well as his lower sacrum. We have been using Hydrofera Blue. He has home health predominantly for Foley catheter management. There have been some issues getting the Hydrofera Blue through home health but apparently the patient's wife has some supplies and is managing to get these changed.They come every 3 weeks because of transportation difficulties 05-28-2022 upon evaluation today patient appears to be doing decently well currently in regard to his wounds. He still has a depth to the wound in the sacral region but again this does seem to be a little bit less than previous. He has been using Hydrofera  Blue both on this in the heel ulcer. Both are going to require some cleaning today sharp debridement on the heel I think is cleaning with saline gauze and the sterile Q-tip is probably sufficient for the sacral area which does not appear to have too much necrotic tissue. 06-18-2022 upon evaluation today patient's wounds are showing signs of doing decently well. Fortunately I do not see any signs of active infection locally nor systemically at this time which is great news. No fevers, chills, nausea, vomiting, or diarrhea. 07-15-2022 upon evaluation today patient appears to be doing well currently in regard to his wound. He has been tolerating the dressing changes without complication. Fortunately I think that the heel is doing well except for where it is causing pressure to the heel posteriorly and this apparently is due to some kind of exercise machine that his children got him. Nonetheless I am not sure that that is going to be helpful especially if it is causing discomfort and pain. And especially if it is making the wound worse which is an even bigger deal. With regard to the patient's sacral region this is draining quite significantly. I do think a wound VAC would be ideal here and I would recommend that we go ahead and try to see about getting this ordered for him as soon as possible. The patient's wife and the patient are in agreement with this plan. 08-05-2022 upon evaluation today patient appears to be doing well currently in regard to both his heel and his sacral region. Unfortunately he is not eligible for a wound VAC as he is previously already undergone all the treatment that is allowable under Medicare guidelines for his sacral wound. This is quite unfortunate as I felt like that could have potentially help this speed this up but nonetheless it is where we stand. For that reason I think the [facility] is probably still the best way to continue at this point and I discussed that with the  patient and his wife today. 08-26-2022 upon evaluation today patient appears to be doing well currently in regard to his wound. He on the heel of his looking much better I did have to perform some debridement here. In regard to the sacral area no debridement was necessary it looks to clean this has been although there was a lot of Hydrofera Blue and there much more than is probably necessary. 09-15-2021 upon evaluation today patient appears to be doing well currently in regard to his heel ulcer which is looking okay although the gluteal  region specifically the sacral wound is showing signs of some issues there. Fortunately I do not see any evidence of infection significantly to the heel although I am not so sure about the sacral region. I think he probably would benefit from initiation of antibiotics. 10-01-2022 upon evaluation today patient unfortunately had a hospital stay where he ended up having his catheter which had been apparently blowing up into the urethra. With that being said he had traumatic bleeding and subsequently ended up in the hospital I did review that discharge summary as well. The good news is he is doing somewhat better at this point but he did end up laying on his backside a lot causing the wound on the sacral area to actually be worse today compared to where we have been previous. This is not good and not the direction we want to go. Again the depth is what was actually more significant. 10-22-2022 upon evaluation today patient appears to be doing decently well in regard to his heel ulcer. I do not see any signs of active infection locally nor systemically at this time which is great news. No fevers, chills, nausea, vomiting, or diarrhea. 11-12-2022 upon evaluation today patient appears to be doing well currently with regard to his heel ulcer which is actually showing signs of excellent progress. Fortunately I do not see any signs of infection and I think that he is making good  progress here. Fortunately I do not see any signs of infection with regard to the sacral area either. 12-03-2022 upon evaluation today patient appears to be doing well currently in regard to his wounds. The heel looks to be almost healed but it is still open. With that being said regular continue with the [facility] to both locations. Unfortunately regard to the gluteal region this is not doing quite as well. I discussed with the patient and his wife that we really need to try to see what can be done in order to make sure this is pectin well and stays in. We also had an incident where the patient's wife has a picture of where a plastic bag containing Hydrofera Blue inside of it was actually taped to the wound on his gluteal region followed by an ABD pad secured with tape honestly if I did not see the pictures I would not do believe that she tells me that she is actually going to head over to [facility] home office when she leaves here to have a discussion with somebody about this as that is unacceptable I agree. 12-24-2022 upon evaluation today patient appears to be doing well currently in regard to his heel which is looking better the sacral area is not looking quite as good. It is not infected and is not worse but is also not a lot improved compared to last time I saw him. Again he still is not staying off of this asking today how he was doing with staying off of his gluteal region and offloading and he tells me not very good. 01-14-2023 upon evaluation today patient appears to be doing well currently in regard to his sacral wound which I feel like is making some headway towards closure little by little. Unfortunately his heel is actually significantly worse compared to last I saw him. Fortunately I do not see any signs of infection at this time. 02-04-23 evaluation today patient appears to be doing well with regards to the sacral wound was not doing quite as good. Fortunately I do not see any signs  of infection at either site right now although we will keep a close eye on this heal as we move forward. 8/27; this is a patient we follow for a sacral wound on the right heel wound which were pressure ulcers and this man who is fairly disabled. According to his wife he does not offload this properly he is up sitting in his wheelchair quite a bit during the day. On the sacrum we are using Hydrofera Blue ABDs and on the heel Hydrofera Blue blue and gauze. In general the wounds I think are stable not changing too much recently. Electronic Signature(s) Signed: 02/25/2023 5:06:52 PM By: Baltazar Najjar MD Entered By: Baltazar Najjar on 02/25/2023 07:06:14 Dickie, [name] ([id number]) [id number] Page 4 of 11 -------------------------------------------------------------------------------- Physical Exam Details Patient Name: Date of Service: Ena Dawley [facility] 02/25/2023 8:45 A M Medical Record Number: [id number] Patient Account Number: [id number] Date of Birth/Sex: Treating RN: 10-01-1955 (66 y.o. [name]) [name] Primary Care Provider: Dione Booze Other Clinician: Referring Provider: Treating Provider/Extender: RO BSO Dorris Carnes, MICHA EL Della Goo in Treatment: 39 Constitutional Patient is hypertensive.. Pulse regular and within target range for patient.Marland Kitchen Respirations regular, non-labored and within target range.. Temperature is normal and within the target range for the patient.Marland Kitchen appears in no distress. Notes Wound exam; Sacrum wound is in the lower sacrum. There is considerable depth here and whether there is exposed bone at the bottom of this or a thin layer of tissue I am uncertain. There is no clear infection no erythema or crepitus around the wound. The right heel is more superficial I used a small #3 curette to remove callus thick skin and debris from the small open areas that remain here. Hemostasis with direct pressure. Electronic  Signature(s) Signed: 02/25/2023 5:06:52 PM By: Baltazar Najjar MD Entered By: Baltazar Najjar on 02/25/2023 07:07:33 -------------------------------------------------------------------------------- Physician Orders Details Patient Name: Date of Service: Ena Dawley [facility] 02/25/2023 8:45 A M Medical Record Number: [id number] Patient Account Number: [id number] Date of Birth/Sex: Treating RN: 02-Jun-1956 (66 y.o. [name] Primary Care Provider: Dione Booze Other Clinician: Referring Provider: Treating Provider/Extender: RO BSO Dorris Carnes, MICHA EL Della Goo in Treatment: 57 Verbal / Phone Orders: No Diagnosis Coding Follow-up Appointments Return Appointment in 3 weeks. Home Health [facility] for wound care. May utilize formulary equivalent dressing for wound treatment orders unless otherwise specified. Home Health Nurse may visit PRN to address patients wound care needs. Beverly Gust [phone] **Please direct any NON-WOUND related issues/requests for orders to patient's Primary Care Physician. **If current dressing causes regression in wound condition, may D/C ordered dressing product/s and apply Normal Saline Moist Dressing daily until next Wound Healing Center or Other MD appointment. **Notify Wound Healing Center of regression in wound condition at [phone]. Bathing/ Shower/ Hygiene No tub bath. Anesthetic (Use 'Patient Medications' Section for Anesthetic Order Entry) Lidocaine applied to wound bed Edema Control - Lymphedema / Segmental Compressive Device / Other Elevate, Exercise Daily and A void Standing for Long Periods of Time. Elevate legs to the level of the heart and pump ankles as often as possible Elevate leg(s) parallel to the floor when sitting. DO YOUR BEST to sleep in the bed at night. DO NOT sleep in your recliner. Long hours of sitting in a recliner leads to swelling of the legs and/or potential wounds on your  backside. Off-Loading Gel wheelchair cushion Low air-loss mattress (Group 2) - Sleep in bed every night. Turn and reposition every 2 hours -  keep pressure off of the sacrum and heels wounds Guile, [name] ([id number]) [id number] Page 5 of 11 Other: - PRAFO boot in bed keep pressure off of sacrum/gluteus and heels- Additional Orders / Instructions Follow Nutritious Diet and Increase Protein Intake Wound Treatment Wound #5 - Calcaneus Wound Laterality: Right Cleanser: Normal Saline 3 x Per Week/30 Days Discharge Instructions: Wash your hands with soap and water. Remove old dressing, discard into plastic bag and place into trash. Cleanse the wound with Normal Saline prior to applying a clean dressing using gauze sponges, not tissues or cotton balls. Do not scrub or use excessive force. Pat dry using gauze sponges, not tissue or cotton balls. Cleanser: Soap and Water 3 x Per Week/30 Days Discharge Instructions: Gently cleanse wound with antibacterial soap, rinse and pat dry prior to dressing wounds Prim Dressing: Hydrofera Blue Ready Transfer Foam, 2.5x2.5 (in/in) ary 3 x Per Week/30 Days Discharge Instructions: cut to size of wound Secondary Dressing: Gauze 3 x Per Week/30 Days Discharge Instructions: As directed: dry, moistened with saline or moistened with Dakins Solution Secured With: Medipore T - 14M Medipore H Soft Cloth Surgical T ape ape, 2x2 (in/yd) 3 x Per Week/30 Days Wound #7 - Sacrum Cleanser: Normal Saline 1 x Per Day/30 Days Discharge Instructions: Wash your hands with soap and water. Remove old dressing, discard into plastic bag and place into trash. Cleanse the wound with Normal Saline prior to applying a clean dressing using gauze sponges, not tissues or cotton balls. Do not scrub or use excessive force. Pat dry using gauze sponges, not tissue or cotton balls. Cleanser: Soap and Water 1 x Per Day/30 Days Discharge Instructions: Gently  cleanse wound with antibacterial soap, rinse and pat dry prior to dressing wounds Topical: calmoseptine 1 x Per Day/30 Days Discharge Instructions: apply to excoriated areas / periwound Prim Dressing: Hydrofera Blue Ready Transfer Foam, 4x5 (in/in) 1 x Per Day/30 Days ary Discharge Instructions: Apply Hydrofera Blue Ready to wound bed as directed Secondary Dressing: ABD Pad 5x9 (in/in) 1 x Per Day/30 Days Discharge Instructions: Cover with ABD pad Secured With: Medipore T - 14M Medipore H Soft Cloth Surgical T ape ape, 2x2 (in/yd) 1 x Per Day/30 Days Electronic Signature(s) Signed: 02/25/2023 5:06:52 PM By: Baltazar Najjar MD Signed: 02/28/2023 11:58:03 AM By: [name] RN Entered By: [name] on 02/25/2023 06:19:16 -------------------------------------------------------------------------------- Problem List Details Patient Name: Date of Service: Ena Dawley [facility] 02/25/2023 8:45 A M Medical Record Number: [id number] Patient Account Number: [id number] Date of Birth/Sex: Treating RN: 1956-04-05 (66 y.o. [name] Primary Care Provider: Dione Booze Other Clinician: Referring Provider: Treating Provider/Extender: RO BSO Dorris Carnes, MICHA EL Della Goo in Treatment: [address] [place], [name] ([id number]) [id number] Page 6 of 11 ICD-10 Encounter Code Description Active Date MDM Diagnosis L89.154 Pressure ulcer of sacral region, stage 4 11/22/2021 No Yes L89.613 Pressure ulcer of right heel, stage 3 11/22/2021 No Yes L24.A0 Irritant contact dermatitis due to friction or contact with body fluids, 11/22/2021 No Yes unspecified L98.412 Non-pressure chronic ulcer of buttock with fat layer exposed 11/22/2021 No Yes M62.81 Muscle weakness (generalized) 11/22/2021 No Yes F31.9 Bipolar disorder, unspecified 11/22/2021 No Yes E11.622 Type 2 diabetes mellitus with other skin ulcer 11/22/2021 No Yes I10 Essential (primary)  hypertension 11/22/2021 No Yes Z79.01 Long term (current) use of anticoagulants 11/22/2021 No Yes 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 11/22/2021 No Yes Inactive Problems ICD-10 Code Description Active Date Inactive Date L89.623 Pressure ulcer of left heel,  stage 3 11/22/2021 11/22/2021 Resolved Problems Electronic Signature(s) Signed: 02/25/2023 5:06:52 PM By: Baltazar Najjar MD Entered By: Baltazar Najjar on 02/25/2023 07:04:15 Progress Note Details -------------------------------------------------------------------------------- [name] ([id number]) [id number] Page 7 of 11 Patient Name: Date of Service: Ena Dawley [facility] 02/25/2023 8:45 A M Medical Record Number: [id number] Patient Account Number: [id number] Date of Birth/Sex: Treating RN: 1956-01-14 (66 y.o. [name]) [name] Primary Care Provider: Dione Booze Other Clinician: Referring Provider: Treating Provider/Extender: RO BSO N, MICHA EL Della Goo in Treatment: 65 Subjective History of Present Illness (HPI) 05/29/2021 this is a patient who presents today for initial inspection here in the clinic concerning wounds that he has over the right heel and the sacral region. Unfortunately the sacral wound is starting to spread off to the right gluteal location due to how he sits always leaning towards the right side in his chair. His wife is present she is the primary caregiver though she is not home with him all the time she does have to work. She does do an excellent job however it appears to be in trying to keep things under good control for him. The patient is not able to change positions himself nor walk by himself so he is pretty much at the mercy of the position he is putting when she is gone and this tends to be his chair which she sits and most of the day. Obviously this I think is the main culprit for what is going on currently. It was actually in  January 2020 when the sacral wound started. It was in September 2022 when the wound started to spread more to the right gluteal location. Subsequently in August 2022 is when he had been in a skilled nursing facility and the heel started to give him trouble as well. That does not seem to be doing nearly as poorly as the sacral region. He was hospitalized in October 2022 secondary to sepsis and this was in regard to the foot and was sent to skilled nursing again he is now back at home. He did have a wound VAC for the sacral wound over the summer 2022 but being in and out of facilities this ended up getting sent back. The patient does have [facility] home health that comes out 1 time per week to help with care. His most recent hemoglobin A1c was 6.9 in August 2022. 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 s. 07/24/2021 upon evaluation today patient appears to be doing decently well in regard to the pressure ulcer on the right heel as well as the sacral region. In general I think you are making some progress here which is great news. Overall the heel unfortunately had already closed previously when we saw him although it apparently reopened when he was working with physical therapy according to his wife. The area in the sacral region is doing well and looks clean there is still some depth here but I still think it would be difficult to wound VAC this region. His wife does an awesome job taking care of him. Is been so long since we have seen him because he has been in the hospital to be honest. 08/07/2021 upon evaluation today patient appears to be doing well at this time. Fortunately I do not see any signs of active infection locally or systemically at  this time which is great news. No fevers, chills,  nausea, vomiting, or diarrhea. Unfortunately after I saw him on the 24th he actually ended up in the hospital in the 27th due to being septic. This was not due to the wounds but after looking at records actually due to a UTI. Fortunately he is doing much better and very happy in that regard. I do not see any signs of infection locally nor systemically at this time. 08/21/2021 upon evaluation today patient appears to be doing well with regard to his wound. Fortunately I think that the sacral region is doing decently well at this point which is great news. With regard to the foot this also does have some slough noted but I feel like we are making progress here. He does have some irritation around the right upper thigh/gluteal region. I feel like it is more towards the thigh. Nonetheless this does appear to be pressure related he spends a lot of time sitting up his caregiver states he really will not get in the bed and stay there like he should. 09/04/2021 upon evaluation patient appears to be doing decently well today in regard to the wound on his heel as well as the sacral area. I am actually very pleased with both and how things are appearing currently. There does not appear to be any evidence of active infection I think that his caregiver is doing an awesome job with regard to the wound care here. She is present today as well and I did discuss this with her. 09/25/2021 upon evaluation today patient appears to be doing well with regard to his wound on the sacral region. His right heel is also doing well. Unfortunately he has a new area on his left heel which does appear to be pressure injury. I do not see any evidence of active infection locally or systemically which is great news no fevers, chills, nausea, vomiting, or diarrhea. Readmission: 11-22-2021 upon evaluation today patient presents for reevaluation here in the clinic concerning issues with his wounds. Since have last seen him he was in  the hospital and then subsequently ended up in a skilled nursing facility. Subsequently period of time in the facility he actually had a breakdown in the right thigh location which has been an issue for him. Fortunately I do not see any evidence of active infection locally or systemically which is great news and I am very pleased in that regard. Nonetheless he does have still the wounds on both heels as well as the wound in the sacral area and the new area in the right upper thigh/gluteal region. 12-13-2021 upon evaluation today patient appears to be doing well currently in regard to his wounds. The left heel is going to require some sharp debridement. Fortunately the right heel seems to be doing quite well. Overall I think his gluteal region as well as sacral region also are doing well. 01-10-2022 upon evaluation today patient appears to be doing better in regard to some areas unfortunately his sacral area is not doing as well. It seems of gotten worse his caregiver states when he was in the hospital recently they really did not offloading. Fortunately I do not see any evidence of active infection locally or systemically at this time. 02-01-2022 upon evaluation today patient appears to be doing well currently in regard to his wounds. He is going require some sharp debridement of clearway some of the necrotic debris. Fortunately I do not see any evidence of active infection locally  or systemically which is great news. No fevers, chills, nausea, vomiting, or diarrhea. 02-22-2022 upon evaluation today patient appears to be doing poorly in regard to his right heel the left heel is completely closed. Unfortunately I think he is got some deep tissue injury to this area I am going to do a culture at this spot. With that being said he is having 2 other significant issues in regard to the left thigh this is showing signs of cellulitis all the way down into the thigh and was seeing definite temperature difference  in the left thigh versus the right thigh when feeling. It also is of note that the patient is also having some issues here with trouble in regard to having fevers his wife has been giving him Tylenol and ibuprofen to help keep the fevers under control but he has been as high as 102 this is pretty much been going on since Monday or Tuesday of this week. Nonetheless I am concerned I am not exactly sure where the cellulitis on the left thigh is coming from not sure if this is emanating from his sacral region that is a possibility or if there is something completely different going on either way with the fevers I am more worried that he needs to go get this checked out as soon as possible. 03-08-2022 upon evaluation today patient appears to be doing well with regard to his heel as well as sacral region. Both are showing signs of significant improvement compared to the last time I saw him. Overall I think we are in a much better spot 04-04-2022 upon evaluation today patient appears to be doing about the same in regard to his wounds. I do not see any signs of improvement nor worsening at this point. Fortunately there is no evidence of active infection at this time. He is not staying off of this is much as he needs to in fact is not even sleeping in the hospital bed with the alternating air mattress. 04-16-2022 upon evaluation today patient appears to be doing well currently in regard to his wound. He has been tolerating the dressing changes without complication. Fortunately I do not see any evidence of infection. The heel actually looks quite well the sacral area actually is still quite deep. From my questioning and discussion with him today I do not believe that he is really staying off of this as much as he should. He seemed a little uncomfortable during that portion of the conversation today. I think that is contributing quite a bit to his lack of progress with regard to healing. QUINTAVIOUS, NAUSS  ([id number]) [id number] Page 8 of 11 11/7; patient with decubitus ulcers on his right heel as well as his lower sacrum. We have been using Hydrofera Blue. He has home health predominantly for Foley catheter management. There have been some issues getting the Hydrofera Blue through home health but apparently the patient's wife has some supplies and is managing to get these changed.They come every 3 weeks because of transportation difficulties 05-28-2022 upon evaluation today patient appears to be doing decently well currently in regard to his wounds. He still has a depth to the wound in the sacral region but again this does seem to be a little bit less than previous. He has been using Hydrofera Blue both on this in the heel ulcer. Both are going to require some cleaning today sharp debridement on the heel I think is cleaning with saline gauze and the sterile Q-tip is probably sufficient  for the sacral area which does not appear to have too much necrotic tissue. 06-18-2022 upon evaluation today patient's wounds are showing signs of doing decently well. Fortunately I do not see any signs of active infection locally nor systemically at this time which is great news. No fevers, chills, nausea, vomiting, or diarrhea. 07-15-2022 upon evaluation today patient appears to be doing well currently in regard to his wound. He has been tolerating the dressing changes without complication. Fortunately I think that the heel is doing well except for where it is causing pressure to the heel posteriorly and this apparently is due to some kind of exercise machine that his children got him. Nonetheless I am not sure that that is going to be helpful especially if it is causing discomfort and pain. And especially if it is making the wound worse which is an even bigger deal. With regard to the patient's sacral region this is draining quite significantly. I do think a wound VAC would be ideal here and  I would recommend that we go ahead and try to see about getting this ordered for him as soon as possible. The patient's wife and the patient are in agreement with this plan. 08-05-2022 upon evaluation today patient appears to be doing well currently in regard to both his heel and his sacral region. Unfortunately he is not eligible for a wound VAC as he is previously already undergone all the treatment that is allowable under Medicare guidelines for his sacral wound. This is quite unfortunate as I felt like that could have potentially help this speed this up but nonetheless it is where we stand. For that reason I think the [facility] is probably still the best way to continue at this point and I discussed that with the patient and his wife today. 08-26-2022 upon evaluation today patient appears to be doing well currently in regard to his wound. He on the heel of his looking much better I did have to perform some debridement here. In regard to the sacral area no debridement was necessary it looks to clean this has been although there was a lot of Hydrofera Blue and there much more than is probably necessary. 09-15-2021 upon evaluation today patient appears to be doing well currently in regard to his heel ulcer which is looking okay although the gluteal region specifically the sacral wound is showing signs of some issues there. Fortunately I do not see any evidence of infection significantly to the heel although I am not so sure about the sacral region. I think he probably would benefit from initiation of antibiotics. 10-01-2022 upon evaluation today patient unfortunately had a hospital stay where he ended up having his catheter which had been apparently blowing up into the urethra. With that being said he had traumatic bleeding and subsequently ended up in the hospital I did review that discharge summary as well. The good news is he is doing somewhat better at this point but he did end up laying on his  backside a lot causing the wound on the sacral area to actually be worse today compared to where we have been previous. This is not good and not the direction we want to go. Again the depth is what was actually more significant. 10-22-2022 upon evaluation today patient appears to be doing decently well in regard to his heel ulcer. I do not see any signs of active infection locally nor systemically at this time which is great news. No fevers, chills, nausea, vomiting, or  diarrhea. 11-12-2022 upon evaluation today patient appears to be doing well currently with regard to his heel ulcer which is actually showing signs of excellent progress. Fortunately I do not see any signs of infection and I think that he is making good progress here. Fortunately I do not see any signs of infection with regard to the sacral area either. 12-03-2022 upon evaluation today patient appears to be doing well currently in regard to his wounds. The heel looks to be almost healed but it is still open. With that being said regular continue with the [facility] to both locations. Unfortunately regard to the gluteal region this is not doing quite as well. I discussed with the patient and his wife that we really need to try to see what can be done in order to make sure this is pectin well and stays in. We also had an incident where the patient's wife has a picture of where a plastic bag containing Hydrofera Blue inside of it was actually taped to the wound on his gluteal region followed by an ABD pad secured with tape honestly if I did not see the pictures I would not do believe that she tells me that she is actually going to head over to [facility] home office when she leaves here to have a discussion with somebody about this as that is unacceptable I agree. 12-24-2022 upon evaluation today patient appears to be doing well currently in regard to his heel which is looking better the sacral area is not looking quite as good. It is not  infected and is not worse but is also not a lot improved compared to last time I saw him. Again he still is not staying off of this asking today how he was doing with staying off of his gluteal region and offloading and he tells me not very good. 01-14-2023 upon evaluation today patient appears to be doing well currently in regard to his sacral wound which I feel like is making some headway towards closure little by little. Unfortunately his heel is actually significantly worse compared to last I saw him. Fortunately I do not see any signs of infection at this time. 02-04-23 evaluation today patient appears to be doing well with regards to the sacral wound was not doing quite as good. Fortunately I do not see any signs of infection at either site right now although we will keep a close eye on this heal as we move forward. 8/27; this is a patient we follow for a sacral wound on the right heel wound which were pressure ulcers and this man who is fairly disabled. According to his wife he does not offload this properly he is up sitting in his wheelchair quite a bit during the day. On the sacrum we are using Hydrofera Blue ABDs and on the heel Hydrofera Blue blue and gauze. In general the wounds I think are stable not changing too much recently. Objective Constitutional Patient is hypertensive.. Pulse regular and within target range for patient.Marland Kitchen Respirations regular, non-labored and within target range.. Temperature is normal and within the target range for the patient.Marland Kitchen appears in no distress. Vitals Time Taken: 9:10 AM, Height: 66 in, Weight: 279 lbs, BMI: 45, Temperature: 97.8 F, Pulse: 77 bpm, Respiratory Rate: 18 breaths/min, Blood Pressure: 152/85 mmHg. General Notes: Wound exam;  Sacrum wound is in the lower sacrum. There is considerable depth here and whether there is exposed bone at the bottom of this or a thin layer of tissue I  am uncertain. There is no clear infection no erythema or crepitus  around the wound.  The right heel is more superficial I used a small #3 curette to remove callus thick skin and debris from the small open areas that remain here. Hemostasis with direct pressure. Integumentary (Hair, Skin) Pretty, Effrey ([id number]) [id number] Page 9 of 11 Wound #5 status is Open. Original cause of wound was Pressure Injury. The date acquired was: 07/01/2021. The wound has been in treatment 65 weeks. The wound is located on the Right Calcaneus. The wound measures 2.2cm length x 2cm width x 0.3cm depth; 3.456cm^2 area and 1.037cm^3 volume. There is no tunneling or undermining noted. There is a medium amount of serosanguineous drainage noted. There is medium (34-66%) red granulation within the wound bed. There is a medium (34-66%) amount of necrotic tissue within the wound bed including Adherent Slough. Wound #7 status is Open. Original cause of wound was Pressure Injury. The date acquired was: 07/01/2021. The wound has been in treatment 65 weeks. The wound is located on the Sacrum. The wound measures 2cm length x 2cm width x 4cm depth; 3.142cm^2 area and 12.566cm^3 volume. There is Fat Layer (Subcutaneous Tissue) exposed. There is no tunneling or undermining noted. There is a medium amount of serosanguineous drainage noted. There is large (67- 100%) red granulation within the wound bed. There is no necrotic tissue within the wound bed. Assessment Active Problems ICD-10 Pressure ulcer of sacral region, stage 4 Pressure ulcer of right heel, stage 3 Irritant contact dermatitis due to friction or contact with body fluids, unspecified Non-pressure chronic ulcer of buttock with fat layer exposed Muscle weakness (generalized) Bipolar disorder, unspecified Type 2 diabetes mellitus with other skin ulcer Essential (primary) hypertension Long term (current) use of anticoagulants Personal history of traumatic brain injury Procedures Wound  #5 Pre-procedure diagnosis of Wound #5 is a Pressure Ulcer located on the Right Calcaneus . There was a Selective/Open Wound Non-Viable Tissue Debridement with a total area of 3.45 sq cm performed by Maxwell Caul, MD. With the following instrument(s): Curette to remove Viable and Non-Viable tissue/material. Material removed includes Eschar and Slough and. No specimens were taken. A time out was conducted at 09:15, prior to the start of the procedure. A Moderate amount of bleeding was controlled with Pressure. The procedure was tolerated well with a pain level of 0 throughout and a pain level of 0 following the procedure. Post Debridement Measurements: 2.2cm length x 2cm width x 0.3cm depth; 1.037cm^3 volume. Post debridement Stage noted as Category/Stage III. Character of Wound/Ulcer Post Debridement is improved. Post procedure Diagnosis Wound #5: Same as Pre-Procedure Plan Follow-up Appointments: Return Appointment in 3 weeks. Home Health: [facility] for wound care. May utilize formulary equivalent dressing for wound treatment orders unless otherwise specified. Home Health Nurse may visit PRN to address patients wound care needs. Beverly Gust [phone] **Please direct any NON-WOUND related issues/requests for orders to patient's Primary Care Physician. **If current dressing causes regression in wound condition, may D/C ordered dressing product/s and apply Normal Saline Moist Dressing daily until next Wound Healing Center or Other MD appointment. **Notify Wound Healing Center of regression in wound condition at [phone]. Bathing/ Shower/ Hygiene: No tub bath. Anesthetic (Use 'Patient Medications' Section for Anesthetic Order Entry): Lidocaine applied to wound bed Edema Control - Lymphedema / Segmental Compressive Device / Other: Elevate, Exercise Daily and Avoid Standing for Long Periods of Time. Elevate legs to the level of the heart and pump ankles as often  as  possible Elevate leg(s) parallel to the floor when sitting. DO YOUR BEST to sleep in the bed at night. DO NOT sleep in your recliner. Long hours of sitting in a recliner leads to swelling of the legs and/or potential wounds on your backside. Off-Loading: Gel wheelchair cushion Low air-loss mattress (Group 2) - Sleep in bed every night. Turn and reposition every 2 hours - keep pressure off of the sacrum and heels wounds Other: - PRAFO boot in bed keep pressure off of sacrum/gluteus and heels- Additional Orders / Instructions: Follow Nutritious Diet and Increase Protein Intake WOUND #5: - Calcaneus Wound Laterality: Right Cleanser: Normal Saline 3 x Per Week/30 Days Discharge Instructions: Wash your hands with soap and water. Remove old dressing, discard into plastic bag and place into trash. Cleanse the wound with Normal Saline prior to applying a clean dressing using gauze sponges, not tissues or cotton balls. Do not scrub or use excessive force. Pat dry using gauze sponges, not tissue or cotton balls. Cleanser: Soap and Water 3 x Per Week/30 Days Discharge Instructions: Gently cleanse wound with antibacterial soap, rinse and pat dry prior to dressing wounds Prim Dressing: Hydrofera Blue Ready Transfer Foam, 2.5x2.5 (in/in) 3 x Per Week/30 Days ary Discharge Instructions: cut to size of wound Secondary Dressing: Gauze 3 x Per Week/30 Days Nusz, [name] ([id number]) [id number] Page 10 of 11 Discharge Instructions: As directed: dry, moistened with saline or moistened with Dakins Solution Secured With: Medipore T - 466M Medipore H Soft Cloth Surgical T ape ape, 2x2 (in/yd) 3 x Per Week/30 Days WOUND #7: - Sacrum Wound Laterality: Cleanser: Normal Saline 1 x Per Day/30 Days Discharge Instructions: Wash your hands with soap and water. Remove old dressing, discard into plastic bag and place into trash. Cleanse the wound with Normal Saline prior to applying a  clean dressing using gauze sponges, not tissues or cotton balls. Do not scrub or use excessive force. Pat dry using gauze sponges, not tissue or cotton balls. Cleanser: Soap and Water 1 x Per Day/30 Days Discharge Instructions: Gently cleanse wound with antibacterial soap, rinse and pat dry prior to dressing wounds Topical: calmoseptine 1 x Per Day/30 Days Discharge Instructions: apply to excoriated areas / periwound Prim Dressing: Hydrofera Blue Ready Transfer Foam, 4x5 (in/in) 1 x Per Day/30 Days ary Discharge Instructions: Apply Hydrofera Blue Ready to wound bed as directed Secondary Dressing: ABD Pad 5x9 (in/in) 1 x Per Day/30 Days Discharge Instructions: Cover with ABD pad Secured With: Medipore T - 466M Medipore H Soft Cloth Surgical T ape ape, 2x2 (in/yd) 1 x Per Day/30 Days 1. I did not change the primary dressing to either wound area 2. I am uncertain about the status of the bone under the sacral wound. This does not appear to be acutely infected. Would need to look back in epic to see if this has ever been imaged. I did not feel a culture was warranted at this point 3. He is not offloading this area. We talked about this however I think his wife is also talked to him. Electronic Signature(s) Signed: 02/25/2023 5:06:52 PM By: Baltazar Najjar MD Entered By: Baltazar Najjar on 02/25/2023 07:08:50 -------------------------------------------------------------------------------- SuperBill Details Patient Name: Date of Service: Ena Dawley [facility] 02/25/2023 Medical Record Number: [id number] Patient Account Number: [id number] Date of Birth/Sex: Treating RN: [birth year] (66 y.o. [name] Primary Care Provider: Dione Booze Other Clinician: Referring Provider: Treating Provider/Extender: RO BSO Dorris Carnes, MICHA EL Della Goo in Treatment: 64 Diagnosis  Coding ICD-10 Codes Code Description L89.154 Pressure ulcer of sacral region, stage 4 L89.613 Pressure ulcer of  right heel, stage 3 L24.A0 Irritant contact dermatitis due to friction or contact with body fluids, unspecified L98.412 Non-pressure chronic ulcer of buttock with fat layer exposed M62.81 Muscle weakness (generalized) F31.9 Bipolar disorder, unspecified E11.622 Type 2 diabetes mellitus with other skin ulcer I10 Essential (primary) hypertension Z79.01 Long term (current) use of anticoagulants 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 Facility Procedures Physician Procedures : CPT4 Code Description Modifier [id number] - WC PHYS DEBR WO ANESTH 20 SQ CM ICD-10 Diagnosis Description L89.613 Pressure ulcer of right heel, stage 3 Quantity: 1 Electronic Signature(s) Signed: 02/25/2023 5:06:52 PM By: Baltazar Najjar MD Entered By: Baltazar Najjar on 02/25/2023 07:09:07

## 2023-02-28 NOTE — Progress Notes (Signed)
Wife called to informed about the transfer of pt to [facility] room 203 but it went to voice mail. Will continue to monitor.

## 2023-03-01 DIAGNOSIS — T83511A Infection and inflammatory reaction due to indwelling urethral catheter, initial encounter: Secondary | ICD-10-CM | POA: Diagnosis not present

## 2023-03-01 DIAGNOSIS — N390 Urinary tract infection, site not specified: Secondary | ICD-10-CM | POA: Diagnosis not present

## 2023-03-01 DIAGNOSIS — A4150 Gram-negative sepsis, unspecified: Secondary | ICD-10-CM | POA: Diagnosis not present

## 2023-03-01 DIAGNOSIS — Z515 Encounter for palliative care: Secondary | ICD-10-CM | POA: Diagnosis not present

## 2023-03-01 LAB — BASIC METABOLIC PANEL
Anion gap: 7 (ref 5–15)
BUN: 33 mg/dL — ABNORMAL HIGH (ref 8–23)
CO2: 19 mmol/L — ABNORMAL LOW (ref 22–32)
Calcium: 8.3 mg/dL — ABNORMAL LOW (ref 8.9–10.3)
Chloride: 107 mmol/L (ref 98–111)
Creatinine, Ser: 2.05 mg/dL — ABNORMAL HIGH (ref 0.61–1.24)
GFR, Estimated: 35 mL/min — ABNORMAL LOW (ref >60)
Glucose, Bld: 92 mg/dL (ref 70–99)
Potassium: 4.6 mmol/L (ref 3.5–5.1)
Sodium: 133 mmol/L — ABNORMAL LOW (ref 135–145)

## 2023-03-01 LAB — GLUCOSE, CAPILLARY
Glucose-Capillary: 83 mg/dL (ref 70–99)
Glucose-Capillary: 102 mg/dL — ABNORMAL HIGH (ref 70–99)
Glucose-Capillary: 128 mg/dL — ABNORMAL HIGH (ref 70–99)
Glucose-Capillary: 129 mg/dL — ABNORMAL HIGH (ref 70–99)

## 2023-03-01 LAB — CBC
HCT: 28.5 % — ABNORMAL LOW (ref 39.0–52.0)
Hemoglobin: 9 g/dL — ABNORMAL LOW (ref 13.0–17.0)
MCH: 27 pg (ref 26.0–34.0)
MCHC: 31.6 g/dL (ref 30.0–36.0)
MCV: 85.6 fL (ref 80.0–100.0)
Platelets: 115 10*3/uL — ABNORMAL LOW (ref 150–400)
RBC: 3.33 MIL/uL — ABNORMAL LOW (ref 4.22–5.81)
RDW: 17.8 % — ABNORMAL HIGH (ref 11.5–15.5)
WBC: 6.5 10*3/uL (ref 4.0–10.5)
nRBC: 0 % (ref 0.0–0.2)

## 2023-03-01 LAB — PHOSPHORUS: Phosphorus: 2.7 mg/dL (ref 2.5–4.6)

## 2023-03-01 LAB — PROCALCITONIN: Procalcitonin: 33.66 ng/mL

## 2023-03-01 LAB — MAGNESIUM: Magnesium: 1.9 mg/dL (ref 1.7–2.4)

## 2023-03-01 MED ORDER — HYDRALAZINE HCL 20 MG/ML IJ SOLN: 10.0000 mg | Freq: Four times a day (QID) | INTRAMUSCULAR | Status: DC | PRN

## 2023-03-01 MED ORDER — AMLODIPINE BESYLATE 5 MG PO TABS
5.0000 mg | ORAL_TABLET | Freq: Every day | ORAL | Status: DC
  Administered 2023-03-01 – 2023-03-02 (×2): 5 mg via ORAL
  Filled 2023-03-01 (×2): qty 1

## 2023-03-01 NOTE — Plan of Care (Signed)
  Problem: Education: Goal: Knowledge of General Education information will improve Description: Including pain rating scale, medication(s)/side effects and non-pharmacologic comfort measures Outcome: Progressing   Problem: Health Behavior/Discharge Planning: Goal: Ability to manage health-related needs will improve Outcome: Progressing   Problem: Clinical Measurements: Goal: Ability to maintain clinical measurements within normal limits will improve Outcome: Progressing Goal: Will remain free from infection Outcome: Progressing Goal: Diagnostic test results will improve Outcome: Progressing Goal: Respiratory complications will improve Outcome: Progressing Goal: Cardiovascular complication will be avoided Outcome: Progressing   Problem: Activity: Goal: Risk for activity intolerance will decrease Outcome: Progressing   Problem: Nutrition: Goal: Adequate nutrition will be maintained Outcome: Progressing   Problem: Coping: Goal: Level of anxiety will decrease Outcome: Progressing   Problem: Elimination: Goal: Will not experience complications related to bowel motility Outcome: Progressing Goal: Will not experience complications related to urinary retention Outcome: Progressing   Problem: Pain Managment: Goal: General experience of comfort will improve Outcome: Progressing   Problem: Safety: Goal: Ability to remain free from injury will improve Outcome: Progressing   Problem: Skin Integrity: Goal: Risk for impaired skin integrity will decrease Outcome: Progressing   Problem: Fluid Volume: Goal: Hemodynamic stability will improve Outcome: Progressing   Problem: Clinical Measurements: Goal: Diagnostic test results will improve Outcome: Progressing Goal: Signs and symptoms of infection will decrease Outcome: Progressing   Problem: Respiratory: Goal: Ability to maintain adequate ventilation will improve Outcome: Progressing   Problem: Education: Goal: Ability  to describe self-care measures that may prevent or decrease complications (Diabetes Survival Skills Education) will improve Outcome: Progressing Goal: Individualized Educational Video(s) Outcome: Progressing   Problem: Coping: Goal: Ability to adjust to condition or change in health will improve Outcome: Progressing   Problem: Fluid Volume: Goal: Ability to maintain a balanced intake and output will improve Outcome: Progressing   Problem: Health Behavior/Discharge Planning: Goal: Ability to identify and utilize available resources and services will improve Outcome: Progressing Goal: Ability to manage health-related needs will improve Outcome: Progressing   Problem: Metabolic: Goal: Ability to maintain appropriate glucose levels will improve Outcome: Progressing   Problem: Nutritional: Goal: Maintenance of adequate nutrition will improve Outcome: Progressing Goal: Progress toward achieving an optimal weight will improve Outcome: Progressing   Problem: Skin Integrity: Goal: Risk for impaired skin integrity will decrease Outcome: Progressing   Problem: Tissue Perfusion: Goal: Adequacy of tissue perfusion will improve Outcome: Progressing   

## 2023-03-01 NOTE — Progress Notes (Signed)
                                                                                                                                                                                                           Daily Progress Note   Patient Name: [name]       Date: 03/01/2023 DOB: February 28, 1956  Age: 67 y.o. MRN#: [id number] Attending Physician: Gillis Santa, MD Primary Care Physician: Dione Booze, MD Admit Date: 02/26/2023  Reason for Consultation/Follow-up: Establishing goals of care  HPI/Brief Hospital Review: 67 y.o. 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    Admitted and being treated for UTI and severe sepsis, catheter replaced on 8/29 in ED   Noted admit 08/2022 for similar issues, Ms. [name] familiar to PMT services as he was followed during previous admissions   Palliative care has been consulted to assist with goals of care conversations.   Subjective: Extensive chart review has been completed prior to meeting patient including labs, vital signs, imaging, progress notes, orders, and available advanced directive documents from current and previous encounters.    Visited with [name] at his bedside. Reports feeling well, denies pain or discomfort, slept well overnight and enjoyed his breakfast. Urine output clear without signs of hematuria. Remains on IV antibiotic therapy for sepsis secondary to complicated UTI-being followed by ID.  Called wife-[name] at the request of [name] and provided medical updates. [name] plans to visit tomorrow, encouraged to reach out to PMT if needs or concerns arise.  Thank you for allowing the  Palliative Medicine Team to assist in the care of this patient.  Total time:  25 minutes  Time spent includes: Detailed review of medical records (labs, imaging, vital signs), medically appropriate exam (mental status, respiratory, cardiac, skin), discussed with treatment team, counseling and educating patient, family and staff, documenting clinical information, medication management and coordination of care.  Leeanne Deed, DNP, AGNP-C Palliative Medicine   Please contact Palliative Medicine Team phone at [phone] for questions and concerns.

## 2023-03-01 NOTE — Progress Notes (Signed)
[facility] Progress Note  [name]    QIO:[id number]  DOA: 02/26/2023     Date of Service: the patient was seen and examined on 03/01/2023  Chief Complaint  Patient presents with   Hematuria   Hematemesis   Brief hospital course: oseph Schaberg is a 67 y.o. male with medical history significant of  bipolar disorder, seizure disorder, type 2 diabetes, TBI, neurogenic bladder with chronic indwelling Foley, obesity, type 2 diabetes, chronic diastolic CHF, stage III CKD presenting with severe sepsis, UTI.  Limited history in the setting of TBI.  Per report, patient with hematuria at home.  Patient new Foley catheter placed on 02/26/23 around 2 PM with family reporting bloody output afterwards.  Patient reported of groin pain.  Positive generalized malaise, weakness fatigue. Mild generalized abdominal pain. Bloody hematemesis at home. No any other complaints. ED w/up: Tmax 102.8, HR 100s, blood pressure 80s to 100s over 40s to 50s.  Satting well on RA.  wbc 4.6, Hb 10.2, plts 140, lactate 2.7-2.9.  Creatinine 2.47, bicarb 19, potassium 5.9. EKG w/ reversed leads, but no peaked T waves.   CT with mild right hydronephrosis as well as malpositioned Foley catheter with tip in the region of the prostatic urethra.  Assessment and Plan:  # Sepsis secondary to complicated UTI, patient has chronic indwelling Foley catheter. Klebsiella pneumonia bacteremia, positive blood culture S/p IV fluid given for sepsis, blood pressure improved.  Vital signs stable. Lactic acidosis resolved Procalcitonin elevated WBC count within normal range  # Complicated UTI, chronic Foley catheter due to neurogenic bladder Foley catheter was replaced in the ED on 8/29 Blood culture growing Klebsiella pneumonia, sensitivities pending Follow urine culture <10k insufficient growth S/p cefepime, de-escalated to ceftriaxone ID consulted, recommended to continue ceftriaxone for now Follow ID for  duration of antibiotics.  # Hyponatremia, monitor sodium level Monitor electrolytes daily   # Type II diabetes mellitus with renal manifestations  SSI A1c   Dyslipidemia: Statin   Essential hypertension HOLD BP regimen in setting of sepsis and hypotension      (HFpEF) heart failure with preserved ejection fraction (HCC) 2D ECHO 08/2021 w/ EF 60-65%, grade 3 diastolic dysfunction  Euvolemic to dry on presentation in setting of sepsis  Monitor volume status closely w/ IVF  Strict Is and Os and daily weights  Follow    CKD (chronic kidney disease), stage III  Metabolic acidosis, continue bicarbonate oral Baseline creatinine around 2-2.6 with GFR in the upper 20s to 30s Creatinine 2.47 with GFR in the upper 20s Monitor Minimize nephrotoxic agents   Hyperkalemia, resolved Potassium 5.9 on presentation Status post Lokelma No peaked T waves on EKG Monitor electrolytes daily     Seizure prophylaxis Cont trileptal    Colostomy in place Appears stable in setting of sepsis  Wound/ostomy consult      Anemia of chronic disease Baseline hgb 8-10  Hgb 10  --9.0 could be dilutional Follow CBC daily     History of DVT (deep vein thrombosis) Cont eliquis, resume at night on 8/30 Monitor hematuria   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  Interventions:  Pressure Injury 02/23/22 Buttocks Bilateral Unstageable - Full thickness tissue loss in which the base of the injury is covered by slough (yellow, tan, gray, green or brown) and/or eschar (tan, brown or black) in the wound bed. Reddened white area around (Active)  02/23/22 1515  Location: Buttocks  Location Orientation:  Bilateral  Staging: Unstageable - Full thickness tissue loss in which the base of the injury is covered by slough (yellow, tan, gray, green or brown) and/or eschar (tan, brown or black) in the wound bed.  Wound Description  (Comments): Reddened white area around the wound. Wound in deep, unstageable, and tunnels.  Present on Admission: Yes  Dressing Type Foam - Lift dressing to assess site every shift 03/01/23 0436     Pressure Injury Heel Right (Active)     Location: Heel  Location Orientation: Right  Staging:   Wound Description (Comments):   Present on Admission: Yes  Dressing Type Foam - Lift dressing to assess site every shift 03/01/23 0436     Diet: Heart healthy carb modified diet DVT Prophylaxis: Therapeutic Anticoagulation with Eliquis    Advance goals of care discussion: Full code  Family Communication: family was not present at bedside, at the time of interview.  The pt provided permission to discuss medical plan with the family. Opportunity was given to ask question and all questions were answered satisfactorily.   Disposition:  Pt is from Home, admitted with sepsis UTI, still on IV Abx, which precludes a safe discharge. Discharge to Home, when stable.  Subjective: No significant events overnight, patient denies any chest pain or palpitation no shortness of breath, no abdominal pain.  Patient was laying comfortably,  Physical Exam: General: NAD, lying comfortably Appear in no distress, affect appropriate Eyes: PERRLA ENT: Oral Mucosa Clear, moist  Neck: no JVD,  Cardiovascular: S1 and S2 Present, no Murmur,  Respiratory: good respiratory effort, Bilateral Air entry equal and Decreased, no Crackles, no wheezes Abdomen: Bowel Sound present, Soft and no tenderness,  Skin: no rashes Extremities: 1-2 Pedal edema, no calf tenderness, edema of b/l Arm Left >Right Neurologic: without any new focal findings Gait not checked due to patient safety concerns  Vitals:   02/28/23 2230 03/01/23 0217 03/01/23 0418 03/01/23 0810  BP: (!) 171/78  (!) 154/84 (!) 157/79  Pulse: 76  72 71  Resp: 20  18 20   Temp: 98.9 F (37.2 C)  98 F (36.7 C) 98.9 F (37.2 C)  TempSrc: Oral  Oral   SpO2: 99%   99% 99%  Weight:  129.4 kg    Height:        Intake/Output Summary (Last 24 hours) at 03/01/2023 1349 Last data filed at 03/01/2023 1020 Gross per 24 hour  Intake 340 ml  Output 2150 ml  Net -1810 ml   Filed Weights   02/27/23 1032 02/28/23 0407 03/01/23 0217  Weight: 125.5 kg 126.7 kg 129.4 kg    Data Reviewed: I have personally reviewed and interpreted daily labs, tele strips, imagings as discussed above. I reviewed all nursing notes, pharmacy notes, vitals, pertinent old records I have discussed plan of care as described above with RN and patient/family.  CBC: Recent Labs  Lab 02/27/23 0006 02/28/23 0304 03/01/23 0359  WBC 4.6 8.2 6.5  NEUTROABS 3.9  --   --   HGB 10.2* 9.1* 9.0*  HCT 33.3* 29.3* 28.5*  MCV 87.9 85.9 85.6  PLT 140* 117* 115*   Basic Metabolic Panel: Recent Labs  Lab 02/27/23 0006 02/27/23 0903 02/27/23 1815 02/28/23 0304 03/01/23 0359  NA 134* 131*  --  132* 133*  K 5.9* 5.4* 4.6 5.0 4.6  CL 107 110  --  105 107  CO2 19* 18*  --  20* 19*  GLUCOSE 176* 85  --  93 92  BUN 43*  39*  --  36* 33*  CREATININE 2.47* 2.39*  --  2.43* 2.05*  CALCIUM 8.2* 7.6*  --  8.1* 8.3*  MG  --   --   --  1.7 1.9  PHOS  --   --   --   --  2.7    Studies: No results found.  Scheduled Meds:  amLODipine  5 mg Oral Daily   apixaban  5 mg Oral BID   Chlorhexidine Gluconate Cloth  6 each Topical Daily   insulin aspart  0-9 Units Subcutaneous TID WC   insulin aspart  3 Units Subcutaneous TID WC   memantine  10 mg Oral BID   Oxcarbazepine  300 mg Oral BID   rosuvastatin  20 mg Oral Daily   sodium bicarbonate  650 mg Oral BID   Continuous Infusions:  cefTRIAXone (ROCEPHIN)  IV 2 g (03/01/23 0902)   PRN Meds: acetaminophen, hydrALAZINE, ondansetron **OR** ondansetron (ZOFRAN) IV, mouth rinse, QUEtiapine  Time spent: 40 minutes  Author: Gillis Santa. MD [facility] 03/01/2023 1:49 PM  To reach On-call, see care teams to locate the attending and  reach out to them via [email or web address]. If 7PM-7AM, please contact night-coverage If you still have difficulty reaching the attending provider, please page the [facility] (Director on Call) for [facility] on amion for assistance.

## 2023-03-02 DIAGNOSIS — A4150 Gram-negative sepsis, unspecified: Secondary | ICD-10-CM | POA: Diagnosis not present

## 2023-03-02 DIAGNOSIS — N390 Urinary tract infection, site not specified: Secondary | ICD-10-CM | POA: Diagnosis not present

## 2023-03-02 LAB — GLUCOSE, CAPILLARY
Glucose-Capillary: 113 mg/dL — ABNORMAL HIGH (ref 70–99)
Glucose-Capillary: 87 mg/dL (ref 70–99)
Glucose-Capillary: 109 mg/dL — ABNORMAL HIGH (ref 70–99)
Glucose-Capillary: 124 mg/dL — ABNORMAL HIGH (ref 70–99)

## 2023-03-02 LAB — CULTURE, BLOOD (ROUTINE X 2)

## 2023-03-02 LAB — CBC
HCT: 28.3 % — ABNORMAL LOW (ref 39.0–52.0)
Hemoglobin: 9.1 g/dL — ABNORMAL LOW (ref 13.0–17.0)
MCH: 27.2 pg (ref 26.0–34.0)
MCHC: 32.2 g/dL (ref 30.0–36.0)
MCV: 84.5 fL (ref 80.0–100.0)
Platelets: 145 10*3/uL — ABNORMAL LOW (ref 150–400)
RBC: 3.35 MIL/uL — ABNORMAL LOW (ref 4.22–5.81)
RDW: 17.3 % — ABNORMAL HIGH (ref 11.5–15.5)
WBC: 6 10*3/uL (ref 4.0–10.5)
nRBC: 0 % (ref 0.0–0.2)

## 2023-03-02 LAB — PHOSPHORUS: Phosphorus: 2.9 mg/dL (ref 2.5–4.6)

## 2023-03-02 LAB — BASIC METABOLIC PANEL
Anion gap: 7 (ref 5–15)
BUN: 31 mg/dL — ABNORMAL HIGH (ref 8–23)
CO2: 19 mmol/L — ABNORMAL LOW (ref 22–32)
Calcium: 8.2 mg/dL — ABNORMAL LOW (ref 8.9–10.3)
Chloride: 106 mmol/L (ref 98–111)
Creatinine, Ser: 2.01 mg/dL — ABNORMAL HIGH (ref 0.61–1.24)
GFR, Estimated: 36 mL/min — ABNORMAL LOW (ref >60)
Glucose, Bld: 103 mg/dL — ABNORMAL HIGH (ref 70–99)
Potassium: 4.4 mmol/L (ref 3.5–5.1)
Sodium: 132 mmol/L — ABNORMAL LOW (ref 135–145)

## 2023-03-02 LAB — MAGNESIUM: Magnesium: 2 mg/dL (ref 1.7–2.4)

## 2023-03-02 LAB — OSMOLALITY: Osmolality: 293 mOsm/kg (ref 275–295)

## 2023-03-02 MED ORDER — AMLODIPINE BESYLATE 5 MG PO TABS
5.0000 mg | ORAL_TABLET | Freq: Once | ORAL | Status: AC
  Administered 2023-03-02: 5 mg via ORAL
  Filled 2023-03-02: qty 1

## 2023-03-02 MED ORDER — AMLODIPINE BESYLATE 10 MG PO TABS
10.0000 mg | ORAL_TABLET | Freq: Every day | ORAL | Status: DC
  Administered 2023-03-03 – 2023-03-05 (×3): 10 mg via ORAL
  Filled 2023-03-02 (×3): qty 1

## 2023-03-02 NOTE — Plan of Care (Signed)
  Problem: Education: Goal: Knowledge of General Education information will improve Description: Including pain rating scale, medication(s)/side effects and non-pharmacologic comfort measures Outcome: Progressing   Problem: Health Behavior/Discharge Planning: Goal: Ability to manage health-related needs will improve Outcome: Progressing   Problem: Clinical Measurements: Goal: Ability to maintain clinical measurements within normal limits will improve Outcome: Progressing Goal: Will remain free from infection Outcome: Progressing Goal: Diagnostic test results will improve Outcome: Progressing Goal: Respiratory complications will improve Outcome: Progressing Goal: Cardiovascular complication will be avoided Outcome: Progressing   Problem: Activity: Goal: Risk for activity intolerance will decrease Outcome: Progressing   Problem: Nutrition: Goal: Adequate nutrition will be maintained Outcome: Progressing   Problem: Coping: Goal: Level of anxiety will decrease Outcome: Progressing   Problem: Elimination: Goal: Will not experience complications related to bowel motility Outcome: Progressing Goal: Will not experience complications related to urinary retention Outcome: Progressing   Problem: Pain Managment: Goal: General experience of comfort will improve Outcome: Progressing   Problem: Safety: Goal: Ability to remain free from injury will improve Outcome: Progressing   Problem: Skin Integrity: Goal: Risk for impaired skin integrity will decrease Outcome: Progressing   Problem: Fluid Volume: Goal: Hemodynamic stability will improve Outcome: Progressing   Problem: Clinical Measurements: Goal: Diagnostic test results will improve Outcome: Progressing Goal: Signs and symptoms of infection will decrease Outcome: Progressing   Problem: Respiratory: Goal: Ability to maintain adequate ventilation will improve Outcome: Progressing   Problem: Education: Goal: Ability  to describe self-care measures that may prevent or decrease complications (Diabetes Survival Skills Education) will improve Outcome: Progressing Goal: Individualized Educational Video(s) Outcome: Progressing   Problem: Coping: Goal: Ability to adjust to condition or change in health will improve Outcome: Progressing   Problem: Fluid Volume: Goal: Ability to maintain a balanced intake and output will improve Outcome: Progressing   Problem: Health Behavior/Discharge Planning: Goal: Ability to identify and utilize available resources and services will improve Outcome: Progressing Goal: Ability to manage health-related needs will improve Outcome: Progressing   Problem: Metabolic: Goal: Ability to maintain appropriate glucose levels will improve Outcome: Progressing   Problem: Nutritional: Goal: Maintenance of adequate nutrition will improve Outcome: Progressing Goal: Progress toward achieving an optimal weight will improve Outcome: Progressing   Problem: Skin Integrity: Goal: Risk for impaired skin integrity will decrease Outcome: Progressing   Problem: Tissue Perfusion: Goal: Adequacy of tissue perfusion will improve Outcome: Progressing   

## 2023-03-02 NOTE — Progress Notes (Signed)
[facility] Progress Note  [name]    UXL:[id number]  DOA: 02/26/2023     Date of Service: the patient was seen and examined on 03/02/2023  Chief Complaint  Patient presents with   Hematuria   Hematemesis   Brief hospital course: [name] is a 67 y.o. male with medical history significant of  bipolar disorder, seizure disorder, type 2 diabetes, TBI, neurogenic bladder with chronic indwelling Foley, obesity, type 2 diabetes, chronic diastolic CHF, stage III CKD presenting with severe sepsis, UTI.  Limited history in the setting of TBI.  Per report, patient with hematuria at home.  Patient new Foley catheter placed on 02/26/23 around 2 PM with family reporting bloody output afterwards.  Patient reported of groin pain.  Positive generalized malaise, weakness fatigue. Mild generalized abdominal pain. Bloody hematemesis at home. No any other complaints. ED w/up: Tmax 102.8, HR 100s, blood pressure 80s to 100s over 40s to 50s.  Satting well on RA.  wbc 4.6, Hb 10.2, plts 140, lactate 2.7-2.9.  Creatinine 2.47, bicarb 19, potassium 5.9. EKG w/ reversed leads, but no peaked T waves.   CT with mild right hydronephrosis as well as malpositioned Foley catheter with tip in the region of the prostatic urethra.  Assessment and Plan:  # Sepsis secondary to complicated UTI, patient has chronic indwelling Foley catheter. Klebsiella pneumonia bacteremia, positive blood culture S/p IV fluid given for sepsis, blood pressure improved.  Vital signs stable. Lactic acidosis resolved Procalcitonin elevated WBC count within normal range  # Complicated UTI, chronic Foley catheter due to neurogenic bladder Foley catheter was replaced in the ED on 8/29 Blood culture growing Klebsiella pneumonia, sensitivities pending Follow urine culture <10k insufficient growth S/p cefepime, de-escalated to ceftriaxone ID consulted, recommended to continue ceftriaxone for now Follow ID for  duration of antibiotics.  # Hyponatremia, monitor sodium level Monitor electrolytes daily   # Type II diabetes mellitus with renal manifestations  SSI A1c   Dyslipidemia: Statin   Essential hypertension Blood pressure improved, patient was hypotensive due to sepsis on arrival So home medications were held at that time Resumed amlodipine and increase to 10 mg p.o. daily Holding olmesartan due to hyperkalemia and AKI on admission Monitor BP and titrate medications accordingly      (HFpEF) heart failure with preserved ejection fraction (HCC) 2D ECHO 08/2021 w/ EF 60-65%, grade 3 diastolic dysfunction  Euvolemic to dry on presentation in setting of sepsis  Monitor volume status closely w/ IVF  Strict Is and Os and daily weights  Follow    CKD (chronic kidney disease), stage III  Metabolic acidosis, continue bicarbonate oral Baseline creatinine around 2-2.6 with GFR in the upper 20s to 30s Creatinine 2.01 improved Monitor Minimize nephrotoxic agents   Hyperkalemia, resolved Potassium 5.9 on presentation Status post Lokelma No peaked T waves on EKG Monitor electrolytes daily     Seizure prophylaxis Cont trileptal    Colostomy in place Appears stable in setting of sepsis  Wound/ostomy consult      Anemia of chronic disease Baseline hgb 8-10  Hgb 10  --9.1 stable  Follow CBC daily     History of DVT (deep vein thrombosis) Cont eliquis, resume at night on 8/30 Monitor hematuria   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  Interventions:  Pressure Injury 02/23/22 Buttocks Bilateral Unstageable - Full thickness tissue loss in which the base of the injury is covered by slough (yellow, tan,  gray, green or brown) and/or eschar (tan, brown or black) in the wound bed. Reddened white area around (Active)  02/23/22 1515  Location: Buttocks  Location Orientation: Bilateral  Staging: Unstageable -  Full thickness tissue loss in which the base of the injury is covered by slough (yellow, tan, gray, green or brown) and/or eschar (tan, brown or black) in the wound bed.  Wound Description (Comments): Reddened white area around the wound. Wound in deep, unstageable, and tunnels.  Present on Admission: Yes  Dressing Type Foam - Lift dressing to assess site every shift 03/01/23 0436     Pressure Injury Heel Right (Active)     Location: Heel  Location Orientation: Right  Staging:   Wound Description (Comments):   Present on Admission: Yes  Dressing Type Foam - Lift dressing to assess site every shift 03/01/23 0436     Diet: Heart healthy carb modified diet DVT Prophylaxis: Therapeutic Anticoagulation with Eliquis    Advance goals of care discussion: Full code  Family Communication: family was not present at bedside, at the time of interview.  The pt provided permission to discuss medical plan with the family. Opportunity was given to ask question and all questions were answered satisfactorily.   Disposition:  Pt is from Home, admitted with sepsis UTI, still on IV Abx, which precludes a safe discharge. Discharge to Home, when stable.  Subjective: No significant events overnight, patient was laying comfortably in the bed, denied any active issues, no chest pain or palpitation, no shortness of breath, no abdominal pain.  Physical Exam: General: NAD, lying comfortably Appear in no distress, affect appropriate Eyes: PERRLA ENT: Oral Mucosa Clear, moist  Neck: no JVD,  Cardiovascular: S1 and S2 Present, no Murmur,  Respiratory: good respiratory effort, Bilateral Air entry equal and Decreased, no Crackles, no wheezes Abdomen: Bowel Sound present, Soft and no tenderness,  Skin: no rashes Extremities: Mild Pedal edema, no calf tenderness, mild edema Arms Left >Right Neurologic: without any new focal findings Gait not checked due to patient safety concerns  Vitals:   03/02/23 0338  03/02/23 0716 03/02/23 0851 03/02/23 0856  BP: (!) 159/89  (!) 166/94 (!) 157/85  Pulse: (!) 59  63 64  Resp: 18     Temp: 97.6 F (36.4 C)  98 F (36.7 C)   TempSrc: Oral  Oral   SpO2: 98%  100% 100%  Weight:  129.1 kg    Height:        Intake/Output Summary (Last 24 hours) at 03/02/2023 1314 Last data filed at 03/02/2023 0600 Gross per 24 hour  Intake 480 ml  Output 800 ml  Net -320 ml   Filed Weights   02/28/23 0407 03/01/23 0217 03/02/23 0716  Weight: 126.7 kg 129.4 kg 129.1 kg    Data Reviewed: I have personally reviewed and interpreted daily labs, tele strips, imagings as discussed above. I reviewed all nursing notes, pharmacy notes, vitals, pertinent old records I have discussed plan of care as described above with RN and patient/family.  CBC: Recent Labs  Lab 02/27/23 0006 02/28/23 0304 03/01/23 0359 03/02/23 0405  WBC 4.6 8.2 6.5 6.0  NEUTROABS 3.9  --   --   --   HGB 10.2* 9.1* 9.0* 9.1*  HCT 33.3* 29.3* 28.5* 28.3*  MCV 87.9 85.9 85.6 84.5  PLT 140* 117* 115* 145*   Basic Metabolic Panel: Recent Labs  Lab 02/27/23 0006 02/27/23 0903 02/27/23 1815 02/28/23 0304 03/01/23 0359 03/02/23 0405  NA 134* 131*  --  132* 133* 132*  K 5.9* 5.4* 4.6 5.0 4.6 4.4  CL 107 110  --  105 107 106  CO2 19* 18*  --  20* 19* 19*  GLUCOSE 176* 85  --  93 92 103*  BUN 43* 39*  --  36* 33* 31*  CREATININE 2.47* 2.39*  --  2.43* 2.05* 2.01*  CALCIUM 8.2* 7.6*  --  8.1* 8.3* 8.2*  MG  --   --   --  1.7 1.9 2.0  PHOS  --   --   --   --  2.7 2.9    Studies: No results found.  Scheduled Meds:  [START ON 03/03/2023] amLODipine  10 mg Oral Daily   amLODipine  5 mg Oral Once   apixaban  5 mg Oral BID   Chlorhexidine Gluconate Cloth  6 each Topical Daily   insulin aspart  0-9 Units Subcutaneous TID WC   insulin aspart  3 Units Subcutaneous TID WC   memantine  10 mg Oral BID   Oxcarbazepine  300 mg Oral BID   rosuvastatin  20 mg Oral Daily   sodium bicarbonate  650 mg  Oral BID   Continuous Infusions:  cefTRIAXone (ROCEPHIN)  IV 2 g (03/01/23 0902)   PRN Meds: acetaminophen, hydrALAZINE, ondansetron **OR** ondansetron (ZOFRAN) IV, mouth rinse, QUEtiapine  Time spent: 40 minutes  Author: Gillis Santa. MD [facility] 03/02/2023 1:14 PM  To reach On-call, see care teams to locate the attending and reach out to them via [email or web address]. If 7PM-7AM, please contact night-coverage If you still have difficulty reaching the attending provider, please page the [facility] (Director on Call) for [facility] on amion for assistance.

## 2023-03-02 NOTE — Progress Notes (Signed)
Visited with [name] at his bedside. Awake, alert, reports feeling well today without acute complaints.  No acute palliative needs at this time. PMT to remain available if needs or concerns arise, please reengage as needed.  No Charge.  Leeanne Deed, DNP, AGNP-C Palliative Medicine  Please call Palliative Medicine team phone with any questions [phone]. For individual providers please see AMION.

## 2023-03-03 DIAGNOSIS — R7881 Bacteremia: Secondary | ICD-10-CM

## 2023-03-03 DIAGNOSIS — N390 Urinary tract infection, site not specified: Secondary | ICD-10-CM | POA: Diagnosis not present

## 2023-03-03 DIAGNOSIS — B961 Klebsiella pneumoniae [K. pneumoniae] as the cause of diseases classified elsewhere: Secondary | ICD-10-CM | POA: Diagnosis not present

## 2023-03-03 DIAGNOSIS — N319 Neuromuscular dysfunction of bladder, unspecified: Secondary | ICD-10-CM | POA: Diagnosis not present

## 2023-03-03 DIAGNOSIS — A4150 Gram-negative sepsis, unspecified: Secondary | ICD-10-CM | POA: Diagnosis not present

## 2023-03-03 DIAGNOSIS — T83028D Displacement of other indwelling urethral catheter, subsequent encounter: Secondary | ICD-10-CM | POA: Diagnosis not present

## 2023-03-03 LAB — CBC
HCT: 29.3 % — ABNORMAL LOW (ref 39.0–52.0)
Hemoglobin: 9.4 g/dL — ABNORMAL LOW (ref 13.0–17.0)
MCH: 26.6 pg (ref 26.0–34.0)
MCHC: 32.1 g/dL (ref 30.0–36.0)
MCV: 83 fL (ref 80.0–100.0)
Platelets: 151 10*3/uL (ref 150–400)
RBC: 3.53 MIL/uL — ABNORMAL LOW (ref 4.22–5.81)
RDW: 16.8 % — ABNORMAL HIGH (ref 11.5–15.5)
WBC: 5.7 10*3/uL (ref 4.0–10.5)
nRBC: 0 % (ref 0.0–0.2)

## 2023-03-03 LAB — BASIC METABOLIC PANEL
Anion gap: 8 (ref 5–15)
BUN: 36 mg/dL — ABNORMAL HIGH (ref 8–23)
CO2: 19 mmol/L — ABNORMAL LOW (ref 22–32)
Calcium: 8.3 mg/dL — ABNORMAL LOW (ref 8.9–10.3)
Chloride: 108 mmol/L (ref 98–111)
Creatinine, Ser: 2 mg/dL — ABNORMAL HIGH (ref 0.61–1.24)
GFR, Estimated: 36 mL/min — ABNORMAL LOW (ref >60)
Glucose, Bld: 102 mg/dL — ABNORMAL HIGH (ref 70–99)
Potassium: 4.5 mmol/L (ref 3.5–5.1)
Sodium: 135 mmol/L (ref 135–145)

## 2023-03-03 LAB — GLUCOSE, CAPILLARY
Glucose-Capillary: 80 mg/dL (ref 70–99)
Glucose-Capillary: 105 mg/dL — ABNORMAL HIGH (ref 70–99)
Glucose-Capillary: 133 mg/dL — ABNORMAL HIGH (ref 70–99)
Glucose-Capillary: 134 mg/dL — ABNORMAL HIGH (ref 70–99)

## 2023-03-03 LAB — PHOSPHORUS: Phosphorus: 2.9 mg/dL (ref 2.5–4.6)

## 2023-03-03 LAB — MAGNESIUM: Magnesium: 2 mg/dL (ref 1.7–2.4)

## 2023-03-03 MED ORDER — FUROSEMIDE 10 MG/ML IJ SOLN
40.0000 mg | Freq: Once | INTRAMUSCULAR | Status: AC
  Administered 2023-03-03: 40 mg via INTRAVENOUS
  Filled 2023-03-03: qty 4

## 2023-03-03 MED ORDER — FUROSEMIDE 40 MG PO TABS
40.0000 mg | ORAL_TABLET | Freq: Every day | ORAL | Status: DC
  Administered 2023-03-04 – 2023-03-05 (×2): 40 mg via ORAL
  Filled 2023-03-03 (×2): qty 1

## 2023-03-03 NOTE — Progress Notes (Signed)
[facility] Progress Note  [name]    ZOX:[id number]  DOA: 02/26/2023     Date of Service: the patient was seen and examined on 03/03/2023  Chief Complaint  Patient presents with   Hematuria   Hematemesis   Brief hospital course: oseph [name] is a 67 y.o. male with medical history significant of  bipolar disorder, seizure disorder, type 2 diabetes, TBI, neurogenic bladder with chronic indwelling Foley, obesity, type 2 diabetes, chronic diastolic CHF, stage III CKD presenting with severe sepsis, UTI.  Limited history in the setting of TBI.  Per report, patient with hematuria at home.  Patient new Foley catheter placed on 02/26/23 around 2 PM with family reporting bloody output afterwards.  Patient reported of groin pain.  Positive generalized malaise, weakness fatigue. Mild generalized abdominal pain. Bloody hematemesis at home. No any other complaints. ED w/up: Tmax 102.8, HR 100s, blood pressure 80s to 100s over 40s to 50s.  Satting well on RA.  wbc 4.6, Hb 10.2, plts 140, lactate 2.7-2.9.  Creatinine 2.47, bicarb 19, potassium 5.9. EKG w/ reversed leads, but no peaked T waves.   CT with mild right hydronephrosis as well as malpositioned Foley catheter with tip in the region of the prostatic urethra.  Assessment and Plan:  # Sepsis secondary to complicated UTI, patient has chronic indwelling Foley catheter. Klebsiella pneumonia bacteremia, positive blood culture S/p IV fluid given for sepsis, blood pressure improved.  Vital signs stable. Lactic acidosis resolved Procalcitonin elevated WBC count within normal range  # Complicated UTI, chronic Foley catheter due to neurogenic bladder Foley catheter was replaced in the ED on 8/29 Blood culture growing Klebsiella pneumonia, sensitivities pending Follow urine culture <10k insufficient growth S/p cefepime, de-escalated to ceftriaxone ID consulted, recommended to continue ceftriaxone for now, wil change to  cipro on discharge Follow ID for duration of antibiotics.  # Hyponatremia, monitor sodium level Monitor electrolytes daily  # Type II diabetes mellitus with renal manifestations  SSI A1c   Dyslipidemia: Statin   Essential hypertension Blood pressure improved, patient was hypotensive due to sepsis on arrival So home medications were held at that time Resumed amlodipine and increase to 10 mg p.o. daily Holding olmesartan due to hyperkalemia and AKI on admission Monitor BP and titrate medications accordingly      (HFpEF) heart failure with preserved ejection fraction (HCC) 2D ECHO 08/2021 w/ EF 60-65%, grade 3 diastolic dysfunction  Euvolemic to dry on presentation in setting of sepsis, s/p IVF 9/2 Lasix 40 mg IV x 1 dose followed by 40 mg p.o. daily Strict Is and Os and daily weights  Follow    CKD (chronic kidney disease), stage III  Metabolic acidosis, continue bicarbonate oral Baseline creatinine around 2-2.6 with GFR in the upper 20s to 30s Creatinine 2.0 improved Monitor Minimize nephrotoxic agents   Hyperkalemia, resolved Potassium 5.9 on presentation Status post Lokelma No peaked T waves on EKG Monitor electrolytes daily     Seizure prophylaxis Cont trileptal    Colostomy in place Appears stable in setting of sepsis  Wound/ostomy consult      Anemia of chronic disease Baseline hgb 8-10  Hgb 10  --9.4 stable  Follow CBC daily     History of DVT (deep vein thrombosis) Cont eliquis, resume at night on 8/30 Monitor hematuria   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  Interventions:  Pressure Injury 02/23/22 Buttocks Bilateral Unstageable - Full thickness tissue loss  in which the base of the injury is covered by slough (yellow, tan, gray, green or brown) and/or eschar (tan, brown or black) in the wound bed. Reddened white area around (Active)  02/23/22 1515  Location: Buttocks   Location Orientation: Bilateral  Staging: Unstageable - Full thickness tissue loss in which the base of the injury is covered by slough (yellow, tan, gray, green or brown) and/or eschar (tan, brown or black) in the wound bed.  Wound Description (Comments): Reddened white area around the wound. Wound in deep, unstageable, and tunnels.  Present on Admission: Yes  Dressing Type Foam - Lift dressing to assess site every shift;Silver hydrofiber 03/02/23 2100     Pressure Injury Heel Right (Active)     Location: Heel  Location Orientation: Right  Staging:   Wound Description (Comments):   Present on Admission: Yes  Dressing Type Foam - Lift dressing to assess site every shift;Silver hydrofiber 03/02/23 2100     Diet: Heart healthy carb modified diet DVT Prophylaxis: Therapeutic Anticoagulation with Eliquis    Advance goals of care discussion: Full code  Family Communication: family was not present at bedside, at the time of interview.  The pt provided permission to discuss medical plan with the family. Opportunity was given to ask question and all questions were answered satisfactorily.   Disposition:  Pt is from Home, admitted with sepsis UTI, on IV Abx.  Blood culture positive, seen by ID.  Patient can be discharged on oral antibiotics.  PT and OT eval done recommend SNF placement TOC consulted, bed search and result pending. Discharge to SNF when bed will be available.  Medically stable to discharge   Subjective: No significant events overnight, patient was sitting comfortably at the edge of the bed, he was working with physical therapy.  Denied any active issues.   Physical Exam: General: NAD, lying comfortably Appear in no distress, affect appropriate Eyes: PERRLA ENT: Oral Mucosa Clear, moist  Neck: no JVD,  Cardiovascular: S1 and S2 Present, no Murmur,  Respiratory: good respiratory effort, Bilateral Air entry equal and Decreased, no Crackles, no wheezes Abdomen: Bowel  Sound present, Soft and no tenderness,  Skin: no rashes Extremities: 1-2+ Pedal edema, no calf tenderness, mild edema Arms Left >Right Neurologic: without any new focal findings Gait not checked due to patient safety concerns  Vitals:   03/02/23 2101 03/03/23 0349 03/03/23 0353 03/03/23 0755  BP: (!) 147/73 (!) 156/88  (!) 169/81  Pulse: (!) 53 (!) 58  (!) 56  Resp:  14  20  Temp: 98.2 F (36.8 C) 98 F (36.7 C)  98.1 F (36.7 C)  TempSrc: Oral   Oral  SpO2: 100% 97%  94%  Weight:   126.1 kg   Height:        Intake/Output Summary (Last 24 hours) at 03/03/2023 1418 Last data filed at 03/03/2023 1052 Gross per 24 hour  Intake 480 ml  Output 450 ml  Net 30 ml   Filed Weights   03/01/23 0217 03/02/23 0716 03/03/23 0353  Weight: 129.4 kg 129.1 kg 126.1 kg    Data Reviewed: I have personally reviewed and interpreted daily labs, tele strips, imagings as discussed above. I reviewed all nursing notes, pharmacy notes, vitals, pertinent old records I have discussed plan of care as described above with RN and patient/family.  CBC: Recent Labs  Lab 02/27/23 0006 02/28/23 0304 03/01/23 0359 03/02/23 0405 03/03/23 0335  WBC 4.6 8.2 6.5 6.0 5.7  NEUTROABS 3.9  --   --   --   --  HGB 10.2* 9.1* 9.0* 9.1* 9.4*  HCT 33.3* 29.3* 28.5* 28.3* 29.3*  MCV 87.9 85.9 85.6 84.5 83.0  PLT 140* 117* 115* 145* 151   Basic Metabolic Panel: Recent Labs  Lab 02/27/23 0903 02/27/23 1815 02/28/23 0304 03/01/23 0359 03/02/23 0405 03/03/23 0335  NA 131*  --  132* 133* 132* 135  K 5.4* 4.6 5.0 4.6 4.4 4.5  CL 110  --  105 107 106 108  CO2 18*  --  20* 19* 19* 19*  GLUCOSE 85  --  93 92 103* 102*  BUN 39*  --  36* 33* 31* 36*  CREATININE 2.39*  --  2.43* 2.05* 2.01* 2.00*  CALCIUM 7.6*  --  8.1* 8.3* 8.2* 8.3*  MG  --   --  1.7 1.9 2.0 2.0  PHOS  --   --   --  2.7 2.9 2.9    Studies: No results found.  Scheduled Meds:  amLODipine  10 mg Oral Daily   apixaban  5 mg Oral BID    Chlorhexidine Gluconate Cloth  6 each Topical Daily   insulin aspart  0-9 Units Subcutaneous TID WC   memantine  10 mg Oral BID   Oxcarbazepine  300 mg Oral BID   rosuvastatin  20 mg Oral Daily   sodium bicarbonate  650 mg Oral BID   Continuous Infusions:  cefTRIAXone (ROCEPHIN)  IV 2 g (03/03/23 0912)   PRN Meds: acetaminophen, hydrALAZINE, ondansetron **OR** ondansetron (ZOFRAN) IV, mouth rinse, QUEtiapine  Time spent: 40 minutes  Author: Gillis Santa. MD [facility] 03/03/2023 2:18 PM  To reach On-call, see care teams to locate the attending and reach out to them via [email or web address]. If 7PM-7AM, please contact night-coverage If you still have difficulty reaching the attending provider, please page the [facility] (Director on Call) for [facility] on amion for assistance.

## 2023-03-03 NOTE — [place] FL2 (Signed)
MEDICAID [id number] LEVEL OF CARE FORM     IDENTIFICATION  Patient Name: [name] Birthdate: [birth year] Sex: male Admission Date (Current Location): 02/26/2023  [facility] and [place] Number:  Chiropodist and Address:  [facility], [address], [place], [place] [address]      Provider Number: [id number]  Attending Physician Name and Address:  Gillis Santa, MD  Relative Name and Phone Number:  [name] (wife) [phone]    Current Level of Care: Hospital Recommended Level of Care: Skilled Nursing Facility Prior Approval Number:    Date Approved/Denied:   PASRR Number: [id number]  Discharge Plan: SNF    Current Diagnoses: Patient Active Problem List   Diagnosis Date Noted   CKD (chronic kidney disease), stage III (HCC) 02/27/2023   (HFpEF) heart failure with preserved ejection fraction (HCC) 02/27/2023   Anemia of chronic kidney failure, stage 3 (moderate) (HCC) 09/24/2022   Gross hematuria 09/22/2022   Hydronephrosis of right kidney 09/20/2022   Metabolic acidosis 09/20/2022   Hyperkalemia 09/19/2022   Mass of urinary bladder 09/19/2022   Sepsis due to gram-negative UTI (HCC) 03/20/2022   Type 2 diabetes mellitus without complications (HCC) 03/20/2022   Dyslipidemia 03/20/2022   Complicated UTI (urinary tract infection) 02/22/2022   Acute renal failure superimposed on stage 3b chronic kidney disease (HCC) 02/22/2022   Acute on chronic diastolic CHF (congestive heart failure) (HCC) 02/22/2022   Obesity with body mass index of 30.0-39.9 02/22/2022   Cellulitis of left thigh 02/22/2022   Essential hypertension 01/13/2022   Seizure prophylaxis 01/13/2022   Acute pyelonephritis 01/05/2022   Fever 12/16/2021   Hypotension 10/09/2021   Abdominal distension 10/08/2021   AKI (acute kidney injury) (HCC) 10/08/2021   Sacral decubitus ulcer 10/07/2021   MSSA bacteremia 10/07/2021   Severe sepsis (HCC) 10/06/2021    Sepsis secondary to UTI (HCC) 07/28/2021   COVID-19 virus infection 07/28/2021   Chronic indwelling Foley catheter 06/22/2021   Colostomy in place [facility]) 06/22/2021   TBI (traumatic brain injury) (HCC)    Healed ulcer of foot on examination 04/14/2021   Acute UTI 02/23/2021   Osteomyelitis of vertebra, sacral and sacrococcygeal region (HCC) 10/18/2020   Hyponatremia 07/07/2020   Encephalopathy 07/07/2020   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) 07/07/2020   Anemia of chronic disease 07/07/2020   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) 06/01/2020   Sacral osteomyelitis (HCC) 04/13/2020   Urinary retention 03/19/2020   History of DVT (deep vein thrombosis) 03/19/2020   Pressure injury of sacral region, stage 4 (HCC) 03/19/2020   Severe sepsis with septic shock (HCC) 03/18/2020   Obesity, Class III, BMI 40-49.9 (morbid obesity) (HCC) 03/18/2020   Cellulitis 03/18/2020   Acute cystitis without hematuria    Stage 3b chronic kidney disease (CKD) (HCC) - baseline SCr 1.8-1.9 01/20/2020   Decubitus ulcers 01/17/2020   Sepsis (HCC) 01/16/2020   Cellulitis of sacral region 01/16/2020   Acute kidney injury superimposed on chronic kidney disease (HCC) 01/16/2020   Acute metabolic encephalopathy 01/16/2020   Bipolar disorder, in full remission, most recent episode mixed (HCC) 12/29/2019   High risk medication use 10/25/2019   Noncompliance with treatment plan 10/25/2019   Bipolar I disorder, most recent episode mixed (HCC) 01/07/2019   GAD (generalized anxiety disorder) 01/07/2019   Insomnia due to medical condition 01/07/2019   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) 03/06/2016   Bipolar disorder (HCC) 01/03/2016   Lithium toxicity 01/02/2016  Chronic anticoagulation - on Eliquis 07/06/2014   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/06/2014   Hyperlipidemia 07/06/2014   Leg  swelling 07/06/2014   Obesity, Class II, BMI 35-39.9, with comorbidity 07/06/2014   On medication for venous thromboembolism 07/06/2014   Sleep apnea 07/06/2014   Type II diabetes mellitus with renal manifestations (HCC) 07/06/2014   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   Venous thromboembolism (VTE) 07/06/2014    Orientation RESPIRATION BLADDER Height & Weight     Self, Time, Situation, Place  Normal External catheter Weight: 278 lb (126.1 kg) Height:  5\' 6"  (167.6 cm)  BEHAVIORAL SYMPTOMS/MOOD NEUROLOGICAL BOWEL NUTRITION STATUS      Continent Diet (see discharge summary)  AMBULATORY STATUS COMMUNICATION OF NEEDS Skin   Extensive Assist Verbally Other (Comment) (pressure injury buttocks unstageable, pressure injury right heel)                       Personal Care Assistance Level of Assistance  Bathing, Feeding, Dressing, Total care Bathing Assistance: Limited assistance Feeding assistance: Independent Dressing Assistance: Limited assistance Total Care Assistance: Maximum assistance   Functional Limitations Info  Sight, Hearing, Speech Sight Info: Impaired Hearing Info: Impaired Speech Info: Adequate    SPECIAL CARE FACTORS FREQUENCY  PT (By licensed PT), OT (By licensed OT)     PT Frequency: min 4x weekly OT Frequency: min 4x weekly            Contractures Contractures Info: Not present    Additional Factors Info  Code Status, Allergies Code Status Info: full Allergies Info: NKA           Current Medications (03/03/2023):  This is the current hospital active medication list Current Facility-Administered Medications  Medication Dose Route Frequency Provider Last Rate Last Admin   acetaminophen (TYLENOL) tablet 650 mg  650 mg Oral Q6H PRN Gillis Santa, MD       amLODipine (NORVASC) tablet 10 mg  10 mg Oral Daily Gillis Santa, MD   10 mg at 03/03/23 0912   apixaban (ELIQUIS) tablet 5 mg  5 mg Oral BID Gillis Santa, MD   5 mg at 03/03/23 0912    cefTRIAXone (ROCEPHIN) 2 g in sodium chloride 0.9 % 100 mL IVPB  2 g Intravenous Q24H Gillis Santa, MD 200 mL/hr at 03/03/23 0912 2 g at 03/03/23 0912   Chlorhexidine Gluconate Cloth 2 % PADS 6 each  6 each Topical Daily Floydene Flock, MD   6 each at 03/02/23 1653   hydrALAZINE (APRESOLINE) injection 10 mg  10 mg Intravenous Q6H PRN Gillis Santa, MD       insulin aspart (novoLOG) injection 0-9 Units  0-9 Units Subcutaneous TID WC Floydene Flock, MD   1 Units at 03/01/23 1804   memantine (NAMENDA) tablet 10 mg  10 mg Oral BID Floydene Flock, MD   10 mg at 03/03/23 0912   ondansetron (ZOFRAN) tablet 4 mg  4 mg Oral Q6H PRN Floydene Flock, MD       Or   ondansetron [facility]) injection 4 mg  4 mg Intravenous Q6H PRN Floydene Flock, MD       Oral care mouth rinse  15 mL Mouth Rinse PRN Floydene Flock, MD       Oxcarbazepine (TRILEPTAL) tablet 300 mg  300 mg Oral BID Floydene Flock, MD   300 mg at 03/03/23 0911   QUEtiapine (SEROQUEL) tablet 25 mg  25 mg Oral  QHS PRN Floydene Flock, MD       rosuvastatin (CRESTOR) tablet 20 mg  20 mg Oral Daily Floydene Flock, MD   20 mg at 03/03/23 8119   sodium bicarbonate tablet 650 mg  650 mg Oral BID Floydene Flock, MD   650 mg at 03/03/23 0911     Discharge Medications: Please see discharge summary for a list of discharge medications.  Relevant Imaging Results:  Relevant Lab Results:   Additional Information SSN: [id number]  Darolyn Rua, LCSW

## 2023-03-03 NOTE — TOC Progression Note (Addendum)
Transition of Care [facility]) - Progression Note    Patient Details  Name: [name] MRN: [id number] Date of Birth: 1955-11-26  Transition of [facility]) CM/SW Contact  Darolyn Rua, [place] Phone Number: 03/03/2023, 1:55 PM  Clinical Narrative:     CSW notes new SNF recs, patient reports he is agreeable to short term rehab, no preference of facility is agreeable to CSW sending referrals to local facilities for bed offers.   Referrals sent, pending bed offers at this time.   CSW did speak to patient's wife who reports no to [facility] but they would be open to looking at other options.        Expected Discharge Plan and Services       Living arrangements for the past 2 months: Single Family Home                                       Social Determinants of Health (SDOH) Interventions SDOH Screenings   Food Insecurity: No Food Insecurity (02/27/2023)  Housing: Low Risk  (02/27/2023)  Transportation Needs: No Transportation Needs (02/27/2023)  Utilities: Not At Risk (02/27/2023)  Depression (PHQ2-9): Medium Risk (12/04/2022)  Financial Resource Strain: Low Risk  (12/31/2020)   Received from [facility], [facility]  Physical Activity: Inactive (09/09/2017)  Social Connections: Unknown (09/09/2017)  Stress: No Stress Concern Present (09/09/2017)  Tobacco Use: Low Risk  (02/27/2023)    Readmission Risk Interventions    02/28/2023   11:21 AM 03/23/2022   12:09 PM 02/23/2022    3:44 PM  Readmission Risk Prevention Plan  Transportation Screening Complete Complete Complete  Medication Review Oceanographer) Complete Complete   PCP or Specialist appointment within 3-5 days of discharge Complete Complete Complete  HRI or Home Care Consult Complete Complete Complete  SW Recovery Care/Counseling Consult Complete Complete Complete  Palliative Care Screening Not Applicable Not Applicable Not Applicable  Skilled Nursing Facility Not Applicable Not Applicable Not  Applicable

## 2023-03-03 NOTE — Evaluation (Signed)
Physical Therapy Evaluation Patient Details Name: [name] MRN: [id number] DOB: December 12, 1955 Today's Date: 03/03/2023  History of Present Illness  presented to ER secondary to progressive shaking, vomiting; admitted for management of sepsis related to complicated UTI.  Clinical Impression  Patient resting in bed upon arrival to session; alert and oriented to basic information; pleasant and cooperative, follows simple commands throughout session.  Denies pain.  Bilat UE/LEs generally weak and deconditioned (absent active ankle DF bilat); globally edematous, notable difficulty with flexion of joints (and positioning of extremities) as a result.   Currently requiring mod assist +2 for bed mobility; close sup for unsupported sitting balance; mod assist +2 for sit/stand, standing balance and bed/chair transfer with RW.  Requires bilat UE support on RW to assist with lift off; passive assist to position and stabilize bilat LEs (limited knee flex due to muscle tightness and bilat LE edema). Unable to achieve neutral trunk/hip extension in stance; heavy reliance on RW with forward trunk flexed posture despite cuing/assist.  Unsafe to attempt without +2 at all times due to global weakness, poor balance and increased fall risk. As wife is primary (and sole) caregiver in home environment, patient unable to demonstrate ability to complete transfer at a level that she could safely complete by herself at this time. Would benefit from skilled PT to address above deficits and promote optimal return to PLOF; recommend post-acute PT follow up as indicated by interdisciplinary care team.     Of note, if patient/family pursue transition to home, recommend max [facility] services and hoyer lift for additional support in home environment.      If plan is discharge home, recommend the following: Two people to help with walking and/or transfers;Two people to help with bathing/dressing/bathroom;Assistance with  cooking/housework;Direct supervision/assist for medications management;Direct supervision/assist for financial management;Assist for transportation   Can travel by private vehicle   No    Equipment Recommendations    Recommendations for Other Services       Functional Status Assessment Patient has had a recent decline in their functional status and demonstrates the ability to make significant improvements in function in a reasonable and predictable amount of time.     Precautions / Restrictions Precautions Precautions: Fall Restrictions Weight Bearing Restrictions: No      Mobility  Bed Mobility Overal bed mobility: Needs Assistance Bed Mobility: Supine to Sit     Supine to sit: Mod assist, +2 for safety/equipment     General bed mobility comments: prefers to maintain bilat LEs adduct with movement transition; heavy dependance on bilat UEs and bedrails to assist with truncal elevation and scooting towards edg eof bed    Transfers Overall transfer level: Needs assistance Equipment used: Rolling walker (2 wheels) Transfers: Sit to/from Stand, Bed to chair/wheelchair/BSC Sit to Stand: Mod assist, +2 physical assistance Stand pivot transfers: Mod assist, +2 physical assistance         General transfer comment: requires bilat UE support on RW to assist with lift off; passive assist to position and stabilize bilat LEs (limited knee flex due to muscle tightness and bilat LE edema). Unable to achieve neutral trunk/hip extension in stance; heavy reliance on RW with forward trunk flexed posture despite cuing/assist    Ambulation/Gait               General Gait Details: non-ambulatory at baseline  Stairs            Wheelchair Mobility     Tilt Bed    Modified Rankin (  Stroke Patients Only)       Balance Overall balance assessment: Needs assistance Sitting-balance support: No upper extremity supported, Feet supported Sitting balance-Leahy Scale: Fair      Standing balance support: Bilateral upper extremity supported Standing balance-Leahy Scale: Poor                               Pertinent Vitals/Pain Pain Assessment Pain Assessment: No/denies pain    Home Living Family/patient expects to be discharged to:: Private residence Living Arrangements: Spouse/significant other Available Help at Discharge: Family;Available PRN/intermittently Type of Home: House Home Access: Ramped entrance       Home Layout: One level Home Equipment: Wheelchair - manual;Wheelchair - power;Shower Counsellor (2 wheels)      Prior Function Prior Level of Function : Needs assist             Mobility Comments: Spouse assists with bed mobility and SPT to/from WC. Reports needing assist for WC mobility. spends most of his day in recliner ADLs Comments: Spouse assists with dressing, bathing, and IADLs. sink bath primarily.     Extremity/Trunk Assessment   Upper Extremity Assessment Upper Extremity Assessment: Generalized weakness (grossly 4-/5 throughout; L UE genereally edematous (RN to assess/disconnect IV during session))    Lower Extremity Assessment Lower Extremity Assessment: Generalized weakness (grossly 3-/5 throughout, except bilat ankle DF 0/5 (passive ROM to neutral).  Does appear to have increased adduct tone with exertion)       Communication   Communication Communication: No apparent difficulties  Cognition Arousal: Alert Behavior During Therapy: WFL for tasks assessed/performed Overall Cognitive Status: No family/caregiver present to determine baseline cognitive functioning                                 General Comments: Alert and oriented to self, location and general situation; follows simple commands, pleasant and cooperative        General Comments      Exercises Other Exercises Other Exercises: Sit/stand x3 with RW, mod assist +2 for lift off, standing balance; maintains very  forward flexed posture.  Improving confidence and ability with transfer with repetition   Assessment/Plan    PT Assessment Patient needs continued PT services  PT Problem List Decreased strength;Decreased range of motion;Decreased activity tolerance;Decreased balance;Decreased mobility;Decreased coordination;Decreased cognition;Decreased knowledge of use of DME;Decreased safety awareness;Decreased knowledge of precautions;Obesity       PT Treatment Interventions DME instruction;Functional mobility training;Therapeutic activities;Therapeutic exercise;Balance training;Patient/family education    PT Goals (Current goals can be found in the Care Plan section)  Acute Rehab PT Goals Patient Stated Goal: to go back home PT Goal Formulation: With patient/family Time For Goal Achievement: 03/17/23 Potential to Achieve Goals: Fair Additional Goals Additional Goal #1: Indep with techniques/education for pressure relief and skin protection.    Frequency Min 1X/week     Co-evaluation PT/OT/SLP Co-Evaluation/Treatment: Yes Reason for Co-Treatment: Complexity of the patient's impairments (multi-system involvement);To address functional/ADL transfers;For patient/therapist safety PT goals addressed during session: Mobility/safety with mobility OT goals addressed during session: ADL's and self-care       AM-PAC PT "6 Clicks" Mobility  Outcome Measure Help needed turning from your back to your side while in a flat bed without using bedrails?: A Lot Help needed moving from lying on your back to sitting on the side of a flat bed without using bedrails?: A Lot Help  needed moving to and from a bed to a chair (including a wheelchair)?: A Lot Help needed standing up from a chair using your arms (e.g., wheelchair or bedside chair)?: A Lot Help needed to walk in hospital room?: Total Help needed climbing 3-5 steps with a railing? : Total 6 Click Score: 10    End of Session Equipment Utilized During  Treatment: Gait belt Activity Tolerance: Patient tolerated treatment well Patient left: in chair;with call bell/phone within reach;with chair alarm set Nurse Communication: Mobility status PT Visit Diagnosis: Muscle weakness (generalized) (M62.81);Difficulty in walking, not elsewhere classified (R26.2)    Time: 1610-9604 PT Time Calculation (min) (ACUTE ONLY): 29 min   Charges:   PT Evaluation $PT Eval Moderate Complexity: 1 Mod   PT General Charges $$ ACUTE PT VISIT: 1 Visit         Angelise Petrich H. Manson Passey, PT, DPT, NCS 03/03/23, 12:35 PM [phone]

## 2023-03-03 NOTE — TOC Progression Note (Signed)
Transition of Care [facility]) - Progression Note    Patient Details  Name: [name] MRN: [id number] Date of Birth: [birth year]  Transition of Care [facility]) CM/SW Contact  Darolyn Rua, [place] Phone Number: 03/03/2023, 9:34 AM  Clinical Narrative:     9/2: Chart review indicates patient was active with [facility], have reached out to [place] to confirm patient is still active. CSW has requested PT/OT orders from MD, orders have been placed pending recs for dispo at this time.    8/30: Pt arrived from ED from: Home Caregiver Support: Lives with wife DME at Home: Systems analyst: Family Previous Services: pt stated he was active [employer] but not sure of the name HH/SNF Preference: None First Person of Contact: Spouse [name] PCP: Jene Every, MD   Readmission Prevention Screening Completed   Cm will continue to follow for toc needs and d/c planning.        Expected Discharge Plan and Services       Living arrangements for the past 2 months: Single Family Home                                       Social Determinants of Health (SDOH) Interventions SDOH Screenings   Food Insecurity: No Food Insecurity (02/27/2023)  Housing: Low Risk  (02/27/2023)  Transportation Needs: No Transportation Needs (02/27/2023)  Utilities: Not At Risk (02/27/2023)  Depression (PHQ2-9): Medium Risk (12/04/2022)  Financial Resource Strain: Low Risk  (12/31/2020)   Received from [facility], [facility]  Physical Activity: Inactive (09/09/2017)  Social Connections: Unknown (09/09/2017)  Stress: No Stress Concern Present (09/09/2017)  Tobacco Use: Low Risk  (02/27/2023)    Readmission Risk Interventions    02/28/2023   11:21 AM 03/23/2022   12:09 PM 02/23/2022    3:44 PM  Readmission Risk Prevention Plan  Transportation Screening Complete Complete Complete  Medication Review Oceanographer) Complete Complete   PCP or Specialist appointment within 3-5 days of  discharge Complete Complete Complete  HRI or Home Care Consult Complete Complete Complete  SW Recovery Care/Counseling Consult Complete Complete Complete  Palliative Care Screening Not Applicable Not Applicable Not Applicable  Skilled Nursing Facility Not Applicable Not Applicable Not Applicable

## 2023-03-03 NOTE — Progress Notes (Addendum)
Date of Admission:  02/26/2023    ID: [name] is a 67 y.o. 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)   Type II diabetes mellitus with renal manifestations (HCC)   Sepsis (HCC)   History of DVT (deep vein thrombosis)   Anemia of chronic disease   Colostomy in place [facility])   Essential hypertension   Seizure prophylaxis   Complicated UTI (urinary tract infection)   Dyslipidemia   Hyperkalemia   CKD (chronic kidney disease), stage III (HCC)   (HFpEF) heart failure with preserved ejection fraction (HCC)    Subjective: Feeling good No complaints  Medications:   amLODipine  10 mg Oral Daily   apixaban  5 mg Oral BID   Chlorhexidine Gluconate Cloth  6 each Topical Daily   [START ON 03/04/2023] furosemide  40 mg Oral Daily   insulin aspart  0-9 Units Subcutaneous TID WC   memantine  10 mg Oral BID   Oxcarbazepine  300 mg Oral BID   rosuvastatin  20 mg Oral Daily   sodium bicarbonate  650 mg Oral BID    Objective: Vital signs in last 24 hours: Patient Vitals for the past 24 hrs:  BP Temp Temp src Pulse Resp SpO2 Weight  03/03/23 1651 (!) 151/72 97.9 F (36.6 C) Oral 61 20 99 % --  03/03/23 0755 (!) 169/81 98.1 F (36.7 C) Oral (!) 56 20 94 % --  03/03/23 0353 -- -- -- -- -- -- 126.1 kg  03/03/23 0349 (!) 156/88 98 F (36.7 C) -- (!) 58 14 97 % --  03/02/23 2101 (!) 147/73 98.2 F (36.8 C) Oral (!) 53 -- 100 % --      PHYSICAL EXAM:  General: Alert, cooperative, no distress, appears stated age.  Lungs: Clear to auscultation bilaterally. No Wheezing or Rhonchi. No rales. Heart: Regular rate and rhythm, no murmur, rub or gallop. Abdomen: Soft, non-tender,not distended. Bowel sounds normal. No masses Extremities: atraumatic, no cyanosis. No edema. No clubbing Skin: sacral wound 4 cm- clean and GT  Lymph: Cervical, supraclavicular  normal. Neurologic: paraparesis  Lab Results    Latest Ref Rng & Units 03/03/2023    3:35 AM 03/02/2023    4:05 AM 03/01/2023    3:59 AM  CBC  WBC 4.0 - 10.5 K/uL 5.7  6.0  6.5   Hemoglobin 13.0 - 17.0 g/dL 9.4  9.1  9.0   Hematocrit 39.0 - 52.0 % 29.3  28.3  28.5   Platelets 150 - 400 K/uL 151  145  115        Latest Ref Rng & Units 03/03/2023    3:35 AM 03/02/2023    4:05 AM 03/01/2023    3:59 AM  CMP  Glucose 70 - 99 mg/dL 981  191  92   BUN 8 - 23 mg/dL 36  31  33   Creatinine 0.61 - 1.24 mg/dL 4.78  2.95  6.21   Sodium 135 - 145 mmol/L 135  132  133   Potassium 3.5 - 5.1 mmol/L 4.5  4.4  4.6   Chloride 98 - 111 mmol/L 108  106  107   CO2 22 - 32 mmol/L 19  19  19    Calcium 8.9 - 10.3 mg/dL 8.3  8.2  8.3       Microbiology: BC klebsiella Studies/Results: No results found.   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

## 2023-03-03 NOTE — Evaluation (Signed)
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 Pt presented to ER secondary to progressive shaking, vomiting; admitted for management of sepsis related to complicated UTI.   Clinical Impression   Pt was seen for OT evaluation and co-tx with PT for functional mobility this date. Prior to hospital admission, pt reports having assist from his wife for ADL and transfers. Pt presents to acute OT demonstrating impaired ADL performance and functional mobility 2/2 global weakness, edema, and poor balance (See OT problem list for additional functional deficits). Pt currently requires MOD A +2 for safety with bed mobility, MOD A +2 for ADL transfers with RW, and MAX A - TOTAL A for LB dressing. Pt not at baseline functional independence requiring increased assist for all ADL and mobility. Spouse who is sole caregiver is unable to provide level of assist at present. Pt would benefit from skilled OT services to address noted impairments and functional limitations (see below for any additional details) in order to maximize safety and independence while minimizing falls risk and caregiver burden.     If plan is discharge home, recommend the following: Two people to help with walking and/or transfers;A lot of help with bathing/dressing/bathroom;Assistance with cooking/housework;Help with stairs or ramp for entrance;Assist for transportation    Functional Status Assessment  Patient has had a recent decline in their functional status and demonstrates the ability to make significant improvements in function in a reasonable and predictable amount of time.  Equipment  Recommendations  Other (comment) (defer to next venue of care, if returns home promptly recommend hoyer lift)    Recommendations for Other Services       Precautions / Restrictions Precautions Precautions: Fall Restrictions Weight Bearing Restrictions: No      Mobility Bed Mobility Overal bed mobility: Needs Assistance Bed Mobility: Supine to Sit     Supine to sit: Mod assist, +2 for safety/equipment     General bed mobility comments: prefers to maintain bilat LEs adduct with movement transition; heavy dependance on bilat UEs and bedrails to assist with truncal elevation and scooting towards edg eof bed    Transfers Overall transfer level: Needs assistance Equipment used: Rolling walker (2 wheels) Transfers: Sit to/from Stand, Bed to chair/wheelchair/BSC Sit to Stand: Mod assist, +2 physical assistance Stand pivot transfers: Mod assist, +2 physical assistance         General transfer comment: requires bilat UE support on RW to assist with lift off; passive assist to position and stabilize bilat LEs (limited knee flex due to muscle tightness and bilat LE edema). Unable to achieve neutral trunk/hip extension in stance; heavy reliance on RW with forward trunk flexed posture despite cuing/assist      Balance Overall balance assessment: Needs assistance Sitting-balance support: No upper extremity supported, Feet supported Sitting balance-Leahy Scale: Fair     Standing balance support: Bilateral upper extremity supported Standing balance-Leahy Scale: Poor                             ADL either performed or assessed with clinical judgement   ADL Overall ADL's : Needs assistance/impaired  General ADL Comments: Pt required TOTAL A to don socks, anticipate MAX A - TOTAL A for all other LB dressing and bathing. MOD A +2 for ADL transfers with RW. Set up for supported sitting grooming and feeding.      Vision         Perception         Praxis         Pertinent Vitals/Pain Pain Assessment Pain Assessment: No/denies pain     Extremity/Trunk Assessment Upper Extremity Assessment Upper Extremity Assessment: Generalized weakness (grossly 4-/5 throughout; L UE genereally edematous (RN to assess/disconnect IV during session))   Lower Extremity Assessment Lower Extremity Assessment: Generalized weakness (grossly 3-/5 throughout, except bilat ankle DF 0/5 (passive ROM to neutral). Does appear to have increased adduct tone with exertion)       Communication Communication Communication: No apparent difficulties   Cognition Arousal: Alert Behavior During Therapy: WFL for tasks assessed/performed Overall Cognitive Status: No family/caregiver present to determine baseline cognitive functioning                                 General Comments: Alert and oriented to self, location and general situation; follows simple commands, pleasant and cooperative     General Comments       Exercises     Shoulder Instructions      Home Living Family/patient expects to be discharged to:: Private residence Living Arrangements: Spouse/significant other Available Help at Discharge: Family;Available PRN/intermittently Type of Home: House Home Access: Ramped entrance     Home Layout: One level               Home Equipment: Wheelchair - manual;Wheelchair - power;Shower Counsellor (2 wheels)          Prior Functioning/Environment Prior Level of Function : Needs assist             Mobility Comments: Spouse assists with bed mobility and SPT to/from WC. Reports needing assist for WC mobility. spends most of his day in recliner ADLs Comments: Spouse assists with dressing, bathing, and IADLs. sink bath primarily.        OT Problem List: Decreased strength;Decreased range of motion;Decreased safety awareness;Impaired balance (sitting and/or  standing);Decreased knowledge of use of DME or AE      OT Treatment/Interventions: Self-care/ADL training;Therapeutic exercise;Therapeutic activities;DME and/or AE instruction;Patient/family education;Balance training    OT Goals(Current goals can be found in the care plan section) Acute Rehab OT Goals Patient Stated Goal: go home OT Goal Formulation: With patient Time For Goal Achievement: 03/17/23 Potential to Achieve Goals: Good ADL Goals Additional ADL Goal #1: Pt will engage in seated ADL tasks with caregiver independent in assisting, 3/3 opportunities. Additional ADL Goal #2: Pt will complete UB dressing with setup and supervision, 2/2 opportunities.  OT Frequency: Min 1X/week    Co-evaluation PT/OT/SLP Co-Evaluation/Treatment: Yes Reason for Co-Treatment: Complexity of the patient's impairments (multi-system involvement);To address functional/ADL transfers;For patient/therapist safety PT goals addressed during session: Mobility/safety with mobility OT goals addressed during session: ADL's and self-care      AM-PAC OT "6 Clicks" Daily Activity     Outcome Measure Help from another person eating meals?: None Help from another person taking care of personal grooming?: None Help from another person toileting, which includes using toliet, bedpan, or urinal?: Total Help from another person bathing (including washing, rinsing, drying)?: Total Help from another person to put on and taking off  regular upper body clothing?: A Little Help from another person to put on and taking off regular lower body clothing?: A Lot 6 Click Score: 15   End of Session Equipment Utilized During Treatment: Gait belt;Rolling walker (2 wheels) Nurse Communication: Mobility status;Other (comment) (LUE and IV site)  Activity Tolerance: Patient tolerated treatment well Patient left: in chair;with call bell/phone within reach;with chair alarm set  OT Visit Diagnosis: Other abnormalities of gait and  mobility (R26.89);Muscle weakness (generalized) (M62.81)                Time: 8413-2440 OT Time Calculation (min): 23 min Charges:  OT General Charges $OT Visit: 1 Visit OT Evaluation $OT Eval Moderate Complexity: 1 Mod  Arman Filter., MPH, MS, OTR/L ascom [phone] 03/03/23, 1:19 PM

## 2023-03-04 ENCOUNTER — Ambulatory Visit: Payer: Medicare Other | Admitting: Psychiatry

## 2023-03-04 DIAGNOSIS — N390 Urinary tract infection, site not specified: Secondary | ICD-10-CM | POA: Diagnosis not present

## 2023-03-04 DIAGNOSIS — A4150 Gram-negative sepsis, unspecified: Secondary | ICD-10-CM | POA: Diagnosis not present

## 2023-03-04 LAB — BASIC METABOLIC PANEL
Anion gap: 7 (ref 5–15)
BUN: 38 mg/dL — ABNORMAL HIGH (ref 8–23)
CO2: 23 mmol/L (ref 22–32)
Calcium: 8.6 mg/dL — ABNORMAL LOW (ref 8.9–10.3)
Chloride: 105 mmol/L (ref 98–111)
Creatinine, Ser: 2.19 mg/dL — ABNORMAL HIGH (ref 0.61–1.24)
GFR, Estimated: 32 mL/min — ABNORMAL LOW (ref >60)
Glucose, Bld: 107 mg/dL — ABNORMAL HIGH (ref 70–99)
Potassium: 4.2 mmol/L (ref 3.5–5.1)
Sodium: 135 mmol/L (ref 135–145)

## 2023-03-04 LAB — CBC
HCT: 31.8 % — ABNORMAL LOW (ref 39.0–52.0)
Hemoglobin: 10 g/dL — ABNORMAL LOW (ref 13.0–17.0)
MCH: 26.5 pg (ref 26.0–34.0)
MCHC: 31.4 g/dL (ref 30.0–36.0)
MCV: 84.1 fL (ref 80.0–100.0)
Platelets: 189 10*3/uL (ref 150–400)
RBC: 3.78 MIL/uL — ABNORMAL LOW (ref 4.22–5.81)
RDW: 17.2 % — ABNORMAL HIGH (ref 11.5–15.5)
WBC: 6 10*3/uL (ref 4.0–10.5)
nRBC: 0 % (ref 0.0–0.2)

## 2023-03-04 LAB — GLUCOSE, CAPILLARY
Glucose-Capillary: 108 mg/dL — ABNORMAL HIGH (ref 70–99)
Glucose-Capillary: 126 mg/dL — ABNORMAL HIGH (ref 70–99)
Glucose-Capillary: 133 mg/dL — ABNORMAL HIGH (ref 70–99)
Glucose-Capillary: 135 mg/dL — ABNORMAL HIGH (ref 70–99)

## 2023-03-04 MED ORDER — POLYETHYLENE GLYCOL 3350 17 G PO PACK
17.0000 g | PACK | Freq: Two times a day (BID) | ORAL | Status: DC
  Administered 2023-03-04: 17 g via ORAL
  Filled 2023-03-04 (×2): qty 1

## 2023-03-04 MED ORDER — BISACODYL 5 MG PO TBEC: 10.0000 mg | DELAYED_RELEASE_TABLET | Freq: Every day | ORAL | Status: DC | PRN

## 2023-03-04 NOTE — TOC Progression Note (Addendum)
Transition of Care [facility]) - Progression Note    Patient Details  Name: [name] MRN: [id number] Date of Birth: Mar 24, 1956  [facility]) CM/SW Contact  Margarito Liner, LCSW Phone Number: 03/04/2023, 10:37 AM  Clinical Narrative:   [facility] and [facility] offered beds. [facility] will review. CSW left message for [facility] admissions coordinator asking her to review. [facility], [facility], and [address] declined.  2:02 pm: [facility] and [facility] declined. Patient and wife have accepted bed offer from [facility]. CSW left message for admissions coordinator to let her know and see if they would have a bed once insurance authorization obtained.  2:37 pm: CSW started insurance authorization.  4:23 pm: Insurance authorization still pending.  Expected Discharge Plan and Services       Living arrangements for the past 2 months: Single Family Home                                       Social Determinants of Health (SDOH) Interventions SDOH Screenings   Food Insecurity: No Food Insecurity (02/27/2023)  Housing: Low Risk  (02/27/2023)  Transportation Needs: No Transportation Needs (02/27/2023)  Utilities: Not At Risk (02/27/2023)  Depression (PHQ2-9): Medium Risk (12/04/2022)  Financial Resource Strain: Low Risk  (12/31/2020)   Received from [facility], [facility]  Physical Activity: Inactive (09/09/2017)  Social Connections: Unknown (09/09/2017)  Stress: No Stress Concern Present (09/09/2017)  Tobacco Use: Low Risk  (02/27/2023)    Readmission Risk Interventions    02/28/2023   11:21 AM 03/23/2022   12:09 PM 02/23/2022    3:44 PM  Readmission Risk Prevention Plan  Transportation Screening Complete Complete Complete  Medication Review Oceanographer) Complete Complete   PCP or Specialist appointment within 3-5 days of discharge Complete Complete Complete  HRI or Home Care Consult Complete  Complete Complete  SW Recovery Care/Counseling Consult Complete Complete Complete  Palliative Care Screening Not Applicable Not Applicable Not Applicable  Skilled Nursing Facility Not Applicable Not Applicable Not Applicable

## 2023-03-04 NOTE — Progress Notes (Signed)
[facility] Progress Note  [name]    ZOX:[id number]  DOA: 02/26/2023     Date of Service: the patient was seen and examined on 03/04/2023  Chief Complaint  Patient presents with   Hematuria   Hematemesis   Brief hospital course: [name] is a 67 y.o. male with medical history significant of  bipolar disorder, seizure disorder, type 2 diabetes, TBI, neurogenic bladder with chronic indwelling Foley, obesity, type 2 diabetes, chronic diastolic CHF, stage III CKD presenting with severe sepsis, UTI.  Limited history in the setting of TBI.  Per report, patient with hematuria at home.  Patient new Foley catheter placed on 02/26/23 around 2 PM with family reporting bloody output afterwards.  Patient reported of groin pain.  Positive generalized malaise, weakness fatigue. Mild generalized abdominal pain. Bloody hematemesis at home. No any other complaints. ED w/up: Tmax 102.8, HR 100s, blood pressure 80s to 100s over 40s to 50s.  Satting well on RA.  wbc 4.6, Hb 10.2, plts 140, lactate 2.7-2.9.  Creatinine 2.47, bicarb 19, potassium 5.9. EKG w/ reversed leads, but no peaked T waves.   CT with mild right hydronephrosis as well as malpositioned Foley catheter with tip in the region of the prostatic urethra.  Assessment and Plan:  # Sepsis secondary to complicated UTI, patient has chronic indwelling Foley catheter. Klebsiella pneumonia bacteremia, positive blood culture S/p IV fluid given for sepsis, blood pressure improved.  Vital signs stable. Lactic acidosis resolved Procalcitonin elevated WBC count within normal range  # Complicated UTI, chronic Foley catheter due to neurogenic bladder Foley catheter was replaced in the ED on 8/29 Blood culture growing Klebsiella pneumonia, sensitivities pending Follow urine culture <10k insufficient growth S/p cefepime, de-escalated to ceftriaxone ID consulted, recommended to continue ceftriaxone for now, transition to  Cipro or levo till 03/08/2023  # Hyponatremia, Resolved  monitor sodium level Monitor electrolytes daily  # Type II diabetes mellitus with renal manifestations  SSI A1c   Dyslipidemia: Statin   Essential hypertension Blood pressure improved, patient was hypotensive due to sepsis on arrival So home medications were held at that time Resumed amlodipine and increase to 10 mg p.o. daily Holding olmesartan due to hyperkalemia and AKI on admission Monitor BP and titrate medications accordingly      (HFpEF) heart failure with preserved ejection fraction (HCC) 2D ECHO 08/2021 w/ EF 60-65%, grade 3 diastolic dysfunction  Euvolemic to dry on presentation in setting of sepsis, s/p IVF 9/2 Lasix 40 mg IV x 1 dose followed by 40 mg p.o. daily Strict Is and Os and daily weights  Follow    CKD (chronic kidney disease), stage III  Metabolic acidosis, continue bicarbonate oral Baseline creatinine around 2-2.6 with GFR in the upper 20s to 30s Creatinine 2.19 stable Monitor Minimize nephrotoxic agents   Hyperkalemia, resolved Potassium 5.9 on presentation Status post Lokelma No peaked T waves on EKG Monitor electrolytes daily     Seizure prophylaxis Cont trileptal    Colostomy in place Appears stable in setting of sepsis  Wound/ostomy consult      Anemia of chronic disease Baseline hgb 8-10  Hgb 10   stable  Follow CBC daily     History of DVT (deep vein thrombosis) Cont eliquis, resume at night on 8/30 Monitor hematuria   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  Interventions:  Pressure Injury 02/23/22 Buttocks Bilateral Unstageable - Full thickness tissue loss in which the  base of the injury is covered by slough (yellow, tan, gray, green or brown) and/or eschar (tan, brown or black) in the wound bed. Reddened white area around (Active)  02/23/22 1515  Location: Buttocks  Location Orientation:  Bilateral  Staging: Unstageable - Full thickness tissue loss in which the base of the injury is covered by slough (yellow, tan, gray, green or brown) and/or eschar (tan, brown or black) in the wound bed.  Wound Description (Comments): Reddened white area around the wound. Wound in deep, unstageable, and tunnels.  Present on Admission: Yes  Dressing Type Foam - Lift dressing to assess site every shift 03/04/23 0735     Pressure Injury Heel Right (Active)     Location: Heel  Location Orientation: Right  Staging:   Wound Description (Comments):   Present on Admission: Yes  Dressing Type Foam - Lift dressing to assess site every shift 03/04/23 0735     Diet: Heart healthy carb modified diet DVT Prophylaxis: Therapeutic Anticoagulation with Eliquis    Advance goals of care discussion: Full code  Family Communication: family was not present at bedside, at the time of interview.  The pt provided permission to discuss medical plan with the family. Opportunity was given to ask question and all questions were answered satisfactorily.   Disposition:  Pt is from Home, admitted with sepsis UTI, on IV Abx.  Blood culture positive, seen by ID.  Patient can be discharged on oral antibiotics.  PT and OT eval done recommend SNF placement TOC consulted, bed search and insurance Auth pending. Discharge to SNF when bed will be available.  Medically stable to discharge   Subjective: No significant events overnight, patient was laying in the bed comfortably, denied any shortness of breath, no chest pain or palpitation, no any other active issues. Patient is willing to go to SNF   Physical Exam: General: NAD, lying comfortably Appear in no distress, affect appropriate Eyes: PERRLA ENT: Oral Mucosa Clear, moist  Neck: no JVD,  Cardiovascular: S1 and S2 Present, no Murmur,  Respiratory: good respiratory effort, Bilateral Air entry equal and Decreased, no Crackles, no wheezes Abdomen: Bowel Sound  present, Soft and no tenderness,  Skin: no rashes Extremities: 1-2+ Pedal edema, no calf tenderness, mild edema of Arms  Neurologic: without any new focal findings Gait not checked due to patient safety concerns  Vitals:   03/03/23 2017 03/04/23 0500 03/04/23 0532 03/04/23 0735  BP: 135/82  (!) 147/68 (!) 153/74  Pulse: 60  (!) 55 63  Resp:    16  Temp: 98.6 F (37 C)  97.9 F (36.6 C) 98.3 F (36.8 C)  TempSrc: Oral  Oral Oral  SpO2: 100%  100% 100%  Weight:  123.8 kg    Height:        Intake/Output Summary (Last 24 hours) at 03/04/2023 1327 Last data filed at 03/04/2023 0543 Gross per 24 hour  Intake 780 ml  Output 2250 ml  Net -1470 ml   Filed Weights   03/02/23 0716 03/03/23 0353 03/04/23 0500  Weight: 129.1 kg 126.1 kg 123.8 kg    Data Reviewed: I have personally reviewed and interpreted daily labs, tele strips, imagings as discussed above. I reviewed all nursing notes, pharmacy notes, vitals, pertinent old records I have discussed plan of care as described above with RN and patient/family.  CBC: Recent Labs  Lab 02/27/23 0006 02/28/23 0304 03/01/23 0359 03/02/23 0405 03/03/23 0335 03/04/23 0427  WBC 4.6 8.2 6.5 6.0 5.7 6.0  NEUTROABS  3.9  --   --   --   --   --   HGB 10.2* 9.1* 9.0* 9.1* 9.4* 10.0*  HCT 33.3* 29.3* 28.5* 28.3* 29.3* 31.8*  MCV 87.9 85.9 85.6 84.5 83.0 84.1  PLT 140* 117* 115* 145* 151 189   Basic Metabolic Panel: Recent Labs  Lab 02/28/23 0304 03/01/23 0359 03/02/23 0405 03/03/23 0335 03/04/23 0427  NA 132* 133* 132* 135 135  K 5.0 4.6 4.4 4.5 4.2  CL 105 107 106 108 105  CO2 20* 19* 19* 19* 23  GLUCOSE 93 92 103* 102* 107*  BUN 36* 33* 31* 36* 38*  CREATININE 2.43* 2.05* 2.01* 2.00* 2.19*  CALCIUM 8.1* 8.3* 8.2* 8.3* 8.6*  MG 1.7 1.9 2.0 2.0  --   PHOS  --  2.7 2.9 2.9  --     Studies: No results found.  Scheduled Meds:  amLODipine  10 mg Oral Daily   apixaban  5 mg Oral BID   Chlorhexidine Gluconate Cloth  6 each  Topical Daily   furosemide  40 mg Oral Daily   insulin aspart  0-9 Units Subcutaneous TID WC   memantine  10 mg Oral BID   Oxcarbazepine  300 mg Oral BID   rosuvastatin  20 mg Oral Daily   sodium bicarbonate  650 mg Oral BID   Continuous Infusions:  cefTRIAXone (ROCEPHIN)  IV 2 g (03/04/23 1008)   PRN Meds: acetaminophen, hydrALAZINE, ondansetron **OR** ondansetron (ZOFRAN) IV, mouth rinse, QUEtiapine  Time spent: 40 minutes  Author: Gillis Santa. MD [facility] 03/04/2023 1:27 PM  To reach On-call, see care teams to locate the attending and reach out to them via [email or web address]. If 7PM-7AM, please contact night-coverage If you still have difficulty reaching the attending provider, please page the [facility] (Director on Call) for [facility] on amion for assistance.

## 2023-03-04 NOTE — Plan of Care (Signed)
Patient denies complaints. Patient denies pain or discomfort, patient for possible placement today. Patient has remained free of falls or injury. Nursing will continue to monitor.

## 2023-03-05 DIAGNOSIS — I5032 Chronic diastolic (congestive) heart failure: Secondary | ICD-10-CM

## 2023-03-05 DIAGNOSIS — N1832 Chronic kidney disease, stage 3b: Secondary | ICD-10-CM

## 2023-03-05 DIAGNOSIS — R7881 Bacteremia: Secondary | ICD-10-CM

## 2023-03-05 DIAGNOSIS — E1122 Type 2 diabetes mellitus with diabetic chronic kidney disease: Secondary | ICD-10-CM

## 2023-03-05 DIAGNOSIS — L89154 Pressure ulcer of sacral region, stage 4: Secondary | ICD-10-CM

## 2023-03-05 DIAGNOSIS — T83511A Infection and inflammatory reaction due to indwelling urethral catheter, initial encounter: Secondary | ICD-10-CM

## 2023-03-05 DIAGNOSIS — E875 Hyperkalemia: Secondary | ICD-10-CM | POA: Diagnosis not present

## 2023-03-05 DIAGNOSIS — N390 Urinary tract infection, site not specified: Secondary | ICD-10-CM | POA: Diagnosis not present

## 2023-03-05 DIAGNOSIS — Z933 Colostomy status: Secondary | ICD-10-CM

## 2023-03-05 DIAGNOSIS — L89613 Pressure ulcer of right heel, stage 3: Secondary | ICD-10-CM | POA: Insufficient documentation

## 2023-03-05 DIAGNOSIS — D638 Anemia in other chronic diseases classified elsewhere: Secondary | ICD-10-CM | POA: Diagnosis not present

## 2023-03-05 DIAGNOSIS — L89623 Pressure ulcer of left heel, stage 3: Secondary | ICD-10-CM | POA: Insufficient documentation

## 2023-03-05 LAB — GLUCOSE, CAPILLARY
Glucose-Capillary: 90 mg/dL (ref 70–99)
Glucose-Capillary: 133 mg/dL — ABNORMAL HIGH (ref 70–99)

## 2023-03-05 LAB — CBC
HCT: 31.9 % — ABNORMAL LOW (ref 39.0–52.0)
Hemoglobin: 10 g/dL — ABNORMAL LOW (ref 13.0–17.0)
MCH: 26.5 pg (ref 26.0–34.0)
MCHC: 31.3 g/dL (ref 30.0–36.0)
MCV: 84.4 fL (ref 80.0–100.0)
Platelets: 199 10*3/uL (ref 150–400)
RBC: 3.78 MIL/uL — ABNORMAL LOW (ref 4.22–5.81)
RDW: 17.2 % — ABNORMAL HIGH (ref 11.5–15.5)
WBC: 6.2 10*3/uL (ref 4.0–10.5)
nRBC: 0 % (ref 0.0–0.2)

## 2023-03-05 LAB — BASIC METABOLIC PANEL
Anion gap: 9 (ref 5–15)
BUN: 39 mg/dL — ABNORMAL HIGH (ref 8–23)
CO2: 23 mmol/L (ref 22–32)
Calcium: 8.4 mg/dL — ABNORMAL LOW (ref 8.9–10.3)
Chloride: 104 mmol/L (ref 98–111)
Creatinine, Ser: 2.15 mg/dL — ABNORMAL HIGH (ref 0.61–1.24)
GFR, Estimated: 33 mL/min — ABNORMAL LOW (ref >60)
Glucose, Bld: 116 mg/dL — ABNORMAL HIGH (ref 70–99)
Potassium: 4.2 mmol/L (ref 3.5–5.1)
Sodium: 136 mmol/L (ref 135–145)

## 2023-03-05 MED ORDER — LEVOFLOXACIN 500 MG PO TABS: 500.0000 mg | ORAL_TABLET | Freq: Every day | ORAL | 0 refills | Status: AC

## 2023-03-05 MED ORDER — SODIUM BICARBONATE 650 MG PO TABS: 650.0000 mg | ORAL_TABLET | Freq: Every day | ORAL | 0 refills | Status: DC

## 2023-03-05 MED ORDER — FUROSEMIDE 40 MG PO TABS: 40.0000 mg | ORAL_TABLET | Freq: Every day | ORAL | 0 refills | Status: DC

## 2023-03-05 NOTE — Plan of Care (Signed)
IV removed, report called to Brayton Caves, Charity fundraiser at [facility], EMS contacted and patient waiting for transport

## 2023-03-05 NOTE — Progress Notes (Signed)
Multiple attempts to call report to [facility] to room [facility], unable to get reponse from anyone on unit.  I have contacted EMS will continue to call [facility]

## 2023-03-05 NOTE — Discharge Summary (Signed)
Physician Discharge Summary   [name] MRN: [id number] DOB: [birth year]  Admit date:     02/26/2023  Discharge date: 03/05/23  Discharge Physician: Marcelino Duster   PCP: Dione Booze, MD   Recommendations at discharge:    PCP follow up in 1 week. Complete antibiotic course.  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)   History of DVT (deep vein thrombosis)   Pressure injury of sacral region, stage 4 (HCC)   Anemia of chronic disease   Colostomy in place [facility])   Seizure prophylaxis   Hyperkalemia   CKD (chronic kidney disease), stage III (HCC)   (HFpEF) heart failure with preserved ejection fraction (HCC)   Bacteremia due to Klebsiella pneumoniae   Urinary tract infection associated with indwelling urethral catheter (HCC)   Pressure injury of right heel, stage 3 (HCC)   Pressure injury of left heel, stage 3 (HCC)  Resolved Problems:   * No resolved hospital problems. *  Hospital Course: [name] is a 67 y.o. male with medical history significant of  bipolar disorder, seizure disorder, type 2 diabetes, TBI, neurogenic bladder with chronic indwelling Foley, obesity, type 2 diabetes, chronic diastolic CHF, stage III CKD presenting with severe sepsis, UTI.  Limited history in the setting of TBI.  Per report, patient with hematuria at home.  Patient new Foley catheter placed on 02/26/23 around 2 PM with family reporting bloody output afterwards.  Patient reported of groin pain.  Positive generalized malaise, weakness fatigue. Mild generalized abdominal pain. Bloody hematemesis at home. No any other complaints. ED w/up: Tmax 102.8, HR 100s, blood pressure 80s  to 100s over 40s to 50s.  Satting well on RA.  wbc 4.6, Hb 10.2, plts 140, lactate 2.7-2.9.  Creatinine 2.47, bicarb 19, potassium 5.9. EKG w/ reversed leads, but no peaked T waves.   CT with mild right hydronephrosis as well as malpositioned Foley catheter with tip in the region of the prostatic urethra. Patient is admitted to the hospitalist service with impression of sepsis secondary to complicated UTI.  Patient got fluid resuscitation, IV antibiotic therapy.  Foley catheter is repositioned in ER.  During hospital stay, patient's blood cultures grew Klebsiella pneumonia.  His IV antibiotics transition from cefepime to ceftriaxone.  ID evaluated the patient recommended to continue current antibiotics and transition to Cipro or levo till 03/08/2023.  Patient's electrolytes, blood sugars closely monitored.  His kidney function back to baseline, potassium improved.  Patient does have anemia of chronic disease, hemoglobin stable around 10.  He is continued on Eliquis therapy.  PT OT evaluated the patient recommended skilled nursing facility placement.  Case management worked on Social worker.  Patient is hemodynamically stable to be discharged to skilled nursing facility.  Assessment and Plan:  Sepsis secondary to complicated UTI due to chronic indwelling Foley catheter. Klebsiella pneumonia bacteremia, positive blood culture S/p IV fluid given for sepsis, blood pressure improved.  Vital signs stable. Lactic acidosis resolved Procalcitonin elevated WBC count within normal range   Complicated UTI, chronic Foley catheter due to neurogenic bladder Foley catheter was replaced in the ED on 8/29 Blood culture growing Klebsiella pneumonia, sensitivities pending S/p cefepime, de-escalated to ceftriaxone ID recommended to transition to Cipro or levo till 03/08/2023  Hyponatremia, Resolved   Type II diabetes mellitus with renal manifestations  Continue home oral hypoglycemics   Dyslipidemia:  Statin   Essential hypertension Resumed home meds. BP stable.    (HFpEF) heart failure with preserved ejection fraction (HCC) 2D ECHO 08/2021 w/ EF 60-65%, grade 3 diastolic dysfunction  Euvolemic currently. Continue Lasix 40mg  oral daily.   CKD (chronic kidney disease), stage III  Metabolic acidosis, continue bicarbonate oral Baseline creatinine around 2-2.6 with GFR in the upper 20s to 30s Creatinine 2.19 stable. Avoid nephrotoxic drugs.   Hyperkalemia, resolved Potassium 5.9 on presentation Status post Lokelma No peaked T waves on EKG   Seizure prophylaxis Cont trileptal    Right heel: Stage 3 Pressure Injury Left heel: Stage 3 Pressure Injury Sacrum: Stage 4 Pressure Injury Colostomy in place Appears stable in setting of sepsis  Continue wound/ostomy care.   Anemia of chronic disease Baseline hgb 8-10  Hgb 10   stable    History of DVT (deep vein thrombosis) Cont eliquis, resumed night on 8/30 No more episodes of hematuria.   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 Palliative team evaluated him, he wished for full code.      Consultants: Wound care, ID Procedures performed: none  Disposition: Skilled nursing facility Diet recommendation:  Discharge Diet Orders (From admission, onward)     Start     Ordered   03/05/23 0000  Diet - low sodium heart healthy        03/05/23 1206   03/05/23 0000  Diet Carb Modified        03/05/23 1206           Cardiac and Carb modified diet DISCHARGE MEDICATION: Allergies as of 03/05/2023   No Known Allergies      Medication List     TAKE these medications    amLODipine-olmesartan 5-20 MG tablet Commonly known as: AZOR Take 1 tablet by mouth daily.   cyanocobalamin 1000 MCG tablet Commonly known as: VITAMIN B12 Take 3,000 mcg by mouth daily.   Dexcom G7 Receiver Devi UAD for blood sugar monitoring.   Eliquis 5 MG Tabs tablet Generic drug: apixaban Take 5 mg by  mouth 2 (two) times daily.   fish oil-omega-3 fatty acids 1000 MG capsule Take 1 g by mouth 3 (three) times a week.   furosemide 40 MG tablet Commonly known as: LASIX Take 1 tablet (40 mg total) by mouth daily.   glipiZIDE 2.5 MG 24 hr tablet Commonly known as: GLUCOTROL XL Take 2.5 mg by mouth daily.   levofloxacin 500 MG tablet Commonly known as: Levaquin Take 1 tablet (500 mg total) by mouth daily for 3 days.   memantine 10 MG tablet Commonly known as: NAMENDA Take 10 mg by mouth 2 (two) times daily.   Oxcarbazepine 300 MG tablet Commonly known as: TRILEPTAL Take 1 tablet (300 mg total) by mouth 2 (two) times daily.   QUEtiapine 25 MG tablet Commonly known as: SEROQUEL TAKE 1 TO 2 TABLETS BY MOUTH ONCE DAILY AS NEEDED   rosuvastatin 20 MG tablet Commonly known as: CRESTOR Take 20 mg by mouth at bedtime.   sodium bicarbonate 650 MG tablet Take 1 tablet (650 mg total) by mouth daily. What changed: when to take this   vitamin D3 25 MCG tablet Commonly known as: CHOLECALCIFEROL Take 1 tablet (1,000 Units total) by mouth daily.               Discharge Care Instructions  (From admission,  onward)           Start     Ordered   03/05/23 0000  Discharge wound care:       Comments: 1. Cleanse heel wounds and sacral wound with saline, pat dry 2. Cut to fit silver hydrofiber (Aquacel Ag+) Lawson # [id number] and pack/cover wounds, top with foam (heels) and ABD pads to the sacrum, secure with tape.  Change daily   03/05/23 1206            Contact information for after-discharge care     Destination     [facility] [place] .   Service: Skilled Nursing Contact information: [address] [place] [address] [phone]                    Discharge Exam: Ceasar Mons Weights   03/02/23 0716 03/03/23 0353 03/04/23 0500  Weight: 129.1 kg 126.1 kg 123.8 kg   General - Elderly African American male, sitting in no apparent  distress HEENT - PERRLA, EOMI, atraumatic head, non tender sinuses. Lung - Clear, rales, rhonchi, wheezes. Heart - S1, S2 heard, no murmurs, rubs, 1+ pedal edema. Abdomen - soft, non tender, Colostomy bag with stool noted. Neuro - Alert, awake and oriented, non focal exam. Skin - Warm and dry.  Condition at discharge: stable  The results of significant diagnostics from this hospitalization (including imaging, microbiology, ancillary and laboratory) are listed below for reference.   Imaging Studies: CT ABDOMEN PELVIS WO CONTRAST  Result Date: 02/27/2023 CLINICAL DATA:  Abdominal pain. EXAM: CT ABDOMEN AND PELVIS WITHOUT CONTRAST TECHNIQUE: Multidetector CT imaging of the abdomen and pelvis was performed following the standard protocol without IV contrast. RADIATION DOSE REDUCTION: This exam was performed according to the departmental dose-optimization program which includes automated exposure control, adjustment of the mA and/or kV according to patient size and/or use of iterative reconstruction technique. COMPARISON:  CT abdomen pelvis dated 11/26/2022. FINDINGS: Evaluation of this exam is limited in the absence of intravenous contrast. Lower chest: The visualized lung bases are clear. There is coronary vascular calcification. No intra-abdominal free air or free fluid. Hepatobiliary: The liver is unremarkable. No biliary dilatation. The gallbladder is unremarkable Pancreas: Unremarkable. No pancreatic ductal dilatation or surrounding inflammatory changes. Spleen: Normal in size without focal abnormality. Adrenals/Urinary Tract: The adrenal glands are unremarkable. There is mild right hydronephrosis. No stone noted. The left kidney is unremarkable. The urinary bladder is partially distended and grossly unremarkable. Air within the bladder introduced via the catheter. The Foley catheter with tip in the region of the prostatic urethra and balloon in the bulbous urethra. Recommend further advancing of the  catheter. Stomach/Bowel: Postsurgical changes of the bowel with left anterior loop colostomy. There is no bowel obstruction or active inflammation. The appendix is normal. Vascular/Lymphatic: Moderate aortoiliac atherosclerotic disease. The IVC is unremarkable. No portal venous gas. There is no adenopathy. Reproductive: The prostate and seminal vesicles are grossly unremarkable no pelvic mass. Other: Chronic ulceration of the soft tissues superficial to the coccyx similar to prior CT. No fluid collection. Musculoskeletal: Degenerative changes of the hips and spine. No acute osseous pathology. IMPRESSION: 1. Mild right hydronephrosis. No stone noted. 2. Malpositioned Foley catheter with tip in the region of the prostatic urethra. Recommend further advancing of the catheter. 3. Postsurgical changes of the bowel with left anterior loop colostomy. No bowel obstruction. Normal appendix. 4.  Aortic Atherosclerosis (ICD10-I70.0). Electronically Signed   By: Elgie Collard M.D.   On:  02/27/2023 01:34   DG Chest Port 1 View  Result Date: 02/27/2023 CLINICAL DATA:  Questionable sepsis. EXAM: PORTABLE CHEST 1 VIEW COMPARISON:  Chest radiograph dated 11/26/2022. FINDINGS: No focal consolidation, pleural effusion, or pneumothorax. Stable cardiac silhouette. No acute osseous pathology. IMPRESSION: No active disease. Electronically Signed   By: Elgie Collard M.D.   On: 02/27/2023 00:00    Microbiology: Results for orders placed or performed during the hospital encounter of 02/26/23  Blood Culture (routine x 2)     Status: Abnormal   Collection Time: 02/27/23 12:06 AM   Specimen: BLOOD  Result Value Ref Range Status   Specimen Description   Final    BLOOD BLOOD RIGHT ARM Performed at [facility], [address], [place], [place] [address]    Special Requests   Final    BOTTLES DRAWN AEROBIC AND ANAEROBIC Blood Culture results may not be optimal due to an excessive volume of blood received in culture  bottles Performed at [facility], [address] [place], [place] [address]    Culture  Setup Time   Final    GRAM NEGATIVE RODS AEROBIC BOTTLE ONLY CRITICAL VALUE NOTED.  VALUE IS CONSISTENT WITH PREVIOUSLY REPORTED AND CALLED VALUE. Performed at [facility], [address] [place], [place] [address]    Culture (A)  Final    KLEBSIELLA PNEUMONIAE SUSCEPTIBILITIES PERFORMED ON PREVIOUS CULTURE WITHIN THE LAST 5 DAYS. Performed at [facility], [address], [place], [place] [address]    Report Status 03/02/2023 FINAL  Final  Resp panel by RT-PCR (RSV, Flu A&B, Covid) Anterior Nasal Swab     Status: None   Collection Time: 02/27/23 12:06 AM   Specimen: Anterior Nasal Swab  Result Value Ref Range Status   SARS Coronavirus 2 by RT PCR NEGATIVE NEGATIVE Final    Comment: (NOTE) SARS-CoV-2 target nucleic acids are NOT DETECTED.  The SARS-CoV-2 RNA is generally detectable in upper respiratory specimens during the acute phase of infection. The lowest concentration of SARS-CoV-2 viral copies this assay can detect is 138 copies/mL. A negative result does not preclude SARS-Cov-2 infection and should not be used as the sole basis for treatment or other patient management decisions. A negative result may occur with  improper specimen collection/handling, submission of specimen other than nasopharyngeal swab, presence of viral mutation(s) within the areas targeted by this assay, and inadequate number of viral copies(<138 copies/mL). A negative result must be combined with clinical observations, patient history, and epidemiological information. The expected result is Negative.  Fact Sheet for Patients:  BloggerCourse.com  Fact Sheet for Healthcare Providers:  SeriousBroker.it  This test is no t yet approved or cleared by the [place] FDA and  has been authorized for detection and/or diagnosis of  SARS-CoV-2 by FDA under an Emergency Use Authorization (EUA). This EUA will remain  in effect (meaning this test can be used) for the duration of the COVID-19 declaration under Section 564(b)(1) of the Act, 21 U.S.C.section 360bbb-3(b)(1), unless the authorization is terminated  or revoked sooner.       Influenza A by PCR NEGATIVE NEGATIVE Final   Influenza B by PCR NEGATIVE NEGATIVE Final    Comment: (NOTE) The Xpert Xpress SARS-CoV-2/FLU/RSV plus assay is intended as an aid in the diagnosis of influenza from Nasopharyngeal swab specimens and should not be used as a sole basis for treatment. Nasal washings and aspirates are unacceptable for Xpert Xpress SARS-CoV-2/FLU/RSV testing.  Fact Sheet for Patients: BloggerCourse.com  Fact Sheet for Healthcare Providers: SeriousBroker.it  This test is not yet approved or cleared by the [place] FDA and has been authorized for detection and/or diagnosis of SARS-CoV-2 by FDA under an Emergency Use Authorization (EUA). This EUA will remain in effect (meaning this test can be used) for the duration of the COVID-19 declaration under Section 564(b)(1) of the Act, 21 U.S.C. section 360bbb-3(b)(1), unless the authorization is terminated or revoked.     Resp Syncytial Virus by PCR NEGATIVE NEGATIVE Final    Comment: (NOTE) Fact Sheet for Patients: BloggerCourse.com  Fact Sheet for Healthcare Providers: SeriousBroker.it  This test is not yet approved or cleared by the [place] FDA and has been authorized for detection and/or diagnosis of SARS-CoV-2 by FDA under an Emergency Use Authorization (EUA). This EUA will remain in effect (meaning this test can be used) for the duration of the COVID-19 declaration under Section 564(b)(1) of the Act, 21 U.S.C. section 360bbb-3(b)(1), unless the authorization is terminated  or revoked.  Performed at [facility], [address] [place], [place] [address]   Urine Culture     Status: Abnormal   Collection Time: 02/27/23 12:06 AM   Specimen: Urine, Random  Result Value Ref Range Status   Specimen Description   Final    URINE, RANDOM Performed at [facility], [address], [place], [place] [address]    Special Requests   Final    NONE Reflexed from [phone] Performed at [facility], [address] [place], [place] [address]    Culture (A)  Final    <10,000 COLONIES/mL INSIGNIFICANT GROWTH Performed at [facility], [address] [place], [place] [address]    Report Status 02/28/2023 FINAL  Final  Blood Culture (routine x 2)     Status: Abnormal   Collection Time: 02/27/23 12:07 AM   Specimen: BLOOD  Result Value Ref Range Status   Specimen Description   Final    BLOOD BLOOD LEFT ARM Performed at [facility], [address] [place], [place] [address]    Special Requests   Final    BOTTLES DRAWN AEROBIC AND ANAEROBIC Blood Culture results may not be optimal due to an excessive volume of blood received in culture bottles Performed at [facility], [address] [place], [place] [address]    Culture  Setup Time   Final    GRAM NEGATIVE RODS IN BOTH AEROBIC AND ANAEROBIC BOTTLES CRITICAL RESULT CALLED TO, READ BACK BY AND VERIFIED WITH: TREY GREENWOOD 02/27/23 1625 KLW GRAM STAIN REVIEWED-AGREE WITH RESULT Performed at [facility], [address] [place], [place] [address]    Culture KLEBSIELLA PNEUMONIAE (A)  Final   Report Status 03/02/2023 FINAL  Final   Organism ID, Bacteria KLEBSIELLA PNEUMONIAE  Final      Susceptibility   Klebsiella pneumoniae - MIC*    AMPICILLIN RESISTANT Resistant     CEFEPIME <=0.12 SENSITIVE Sensitive     CEFTAZIDIME <=1 SENSITIVE Sensitive     CEFTRIAXONE <=0.25 SENSITIVE Sensitive     CIPROFLOXACIN <=0.25 SENSITIVE Sensitive     GENTAMICIN  <=1 SENSITIVE Sensitive     IMIPENEM <=0.25 SENSITIVE Sensitive     TRIMETH/SULFA <=20 SENSITIVE Sensitive     AMPICILLIN/SULBACTAM 4 SENSITIVE Sensitive     PIP/TAZO <=4 SENSITIVE Sensitive     * KLEBSIELLA PNEUMONIAE  Blood Culture ID Panel (Reflexed)     Status: Abnormal   Collection Time: 02/27/23 12:07 AM  Result Value Ref Range Status   Enterococcus faecalis NOT DETECTED NOT DETECTED Final   Enterococcus Faecium NOT DETECTED NOT DETECTED Final   Listeria monocytogenes NOT DETECTED NOT DETECTED Final   Staphylococcus species NOT DETECTED NOT DETECTED Final   Staphylococcus aureus (BCID) NOT DETECTED NOT DETECTED Final   Staphylococcus epidermidis NOT DETECTED NOT DETECTED Final   Staphylococcus lugdunensis NOT DETECTED NOT DETECTED Final   Streptococcus species NOT DETECTED NOT DETECTED Final   Streptococcus agalactiae NOT DETECTED NOT DETECTED Final   Streptococcus pneumoniae NOT DETECTED NOT DETECTED Final   Streptococcus pyogenes NOT DETECTED NOT DETECTED Final   A.calcoaceticus-baumannii NOT DETECTED NOT DETECTED Final   Bacteroides fragilis NOT DETECTED NOT DETECTED Final   Enterobacterales DETECTED (A) NOT DETECTED Final    Comment: Enterobacterales represent a large order of gram negative bacteria, not a single organism. CRITICAL RESULT CALLED TO, READ BACK BY AND VERIFIED WITH: TREY GREENWOOD 02/27/23 1625 KLW    Enterobacter cloacae complex NOT DETECTED NOT DETECTED Final   Escherichia coli NOT DETECTED NOT DETECTED Final   Klebsiella aerogenes NOT DETECTED NOT DETECTED Final   Klebsiella oxytoca NOT DETECTED NOT DETECTED Final   Klebsiella pneumoniae DETECTED (A) NOT DETECTED Final    Comment: CRITICAL RESULT CALLED TO, READ BACK BY AND VERIFIED WITH: TREY GREENWOOD 02/27/23 1625 KLW    Proteus species NOT DETECTED NOT DETECTED Final   Salmonella species NOT DETECTED NOT DETECTED Final   Serratia marcescens NOT DETECTED NOT DETECTED Final   Haemophilus influenzae  NOT DETECTED NOT DETECTED Final   Neisseria meningitidis NOT DETECTED NOT DETECTED Final   Pseudomonas aeruginosa NOT DETECTED NOT DETECTED Final   Stenotrophomonas maltophilia NOT DETECTED NOT DETECTED Final   Candida albicans NOT DETECTED NOT DETECTED Final   Candida auris NOT DETECTED NOT DETECTED Final   Candida glabrata NOT DETECTED NOT DETECTED Final   Candida krusei NOT DETECTED NOT DETECTED Final   Candida parapsilosis NOT DETECTED NOT DETECTED Final   Candida tropicalis NOT DETECTED NOT DETECTED Final   Cryptococcus neoformans/gattii NOT DETECTED NOT DETECTED Final   CTX-M ESBL NOT DETECTED NOT DETECTED Final   Carbapenem resistance IMP NOT DETECTED NOT DETECTED Final   Carbapenem resistance KPC NOT DETECTED NOT DETECTED Final   Carbapenem resistance NDM NOT DETECTED NOT DETECTED Final   Carbapenem resist OXA 48 LIKE NOT DETECTED NOT DETECTED Final   Carbapenem resistance VIM NOT DETECTED NOT DETECTED Final    Comment: Performed at [facility], [address] [place], [place] [address]  MRSA Next Gen by PCR, Nasal     Status: None   Collection Time: 02/27/23 10:35 AM   Specimen: Nasal Mucosa; Nasal Swab  Result Value Ref Range Status   MRSA by PCR Next Gen NOT DETECTED NOT DETECTED Final    Comment: (NOTE) The GeneXpert MRSA Assay (FDA approved for NASAL specimens only), is one component of a comprehensive MRSA colonization surveillance program. It is not intended to diagnose MRSA infection nor to guide or monitor treatment for MRSA infections. Test performance is not FDA approved in patients less than 53 years old. Performed at [facility], [address] [place], [place] [address]   Culture, blood (Routine X 2) w Reflex to ID Panel     Status: None (Preliminary result)   Collection Time: 03/03/23  6:26 PM   Specimen: BLOOD  Result Value Ref Range Status   Specimen Description BLOOD BLOOD RIGHT ARM  Final   Special Requests   Final  BOTTLES  DRAWN AEROBIC AND ANAEROBIC Blood Culture results may not be optimal due to an excessive volume of blood received in culture bottles   Culture   Final    NO GROWTH 2 DAYS Performed at [facility], [address] [place], [place] [address]    Report Status PENDING  Incomplete  Culture, blood (Routine X 2) w Reflex to ID Panel     Status: None (Preliminary result)   Collection Time: 03/03/23  6:26 PM   Specimen: BLOOD  Result Value Ref Range Status   Specimen Description BLOOD BLOOD RIGHT HAND  Final   Special Requests   Final    BOTTLES DRAWN AEROBIC AND ANAEROBIC Blood Culture results may not be optimal due to an excessive volume of blood received in culture bottles   Culture   Final    NO GROWTH 2 DAYS Performed at [facility], [address] [place], [place] [address]    Report Status PENDING  Incomplete    Labs: CBC: Recent Labs  Lab 02/27/23 0006 02/28/23 0304 03/01/23 0359 03/02/23 0405 03/03/23 0335 03/04/23 0427 03/05/23 0431  WBC 4.6   < > 6.5 6.0 5.7 6.0 6.2  NEUTROABS 3.9  --   --   --   --   --   --   HGB 10.2*   < > 9.0* 9.1* 9.4* 10.0* 10.0*  HCT 33.3*   < > 28.5* 28.3* 29.3* 31.8* 31.9*  MCV 87.9   < > 85.6 84.5 83.0 84.1 84.4  PLT 140*   < > 115* 145* 151 189 199   < > = values in this interval not displayed.   Basic Metabolic Panel: Recent Labs  Lab 02/28/23 0304 03/01/23 0359 03/02/23 0405 03/03/23 0335 03/04/23 0427 03/05/23 0431  NA 132* 133* 132* 135 135 136  K 5.0 4.6 4.4 4.5 4.2 4.2  CL 105 107 106 108 105 104  CO2 20* 19* 19* 19* 23 23  GLUCOSE 93 92 103* 102* 107* 116*  BUN 36* 33* 31* 36* 38* 39*  CREATININE 2.43* 2.05* 2.01* 2.00* 2.19* 2.15*  CALCIUM 8.1* 8.3* 8.2* 8.3* 8.6* 8.4*  MG 1.7 1.9 2.0 2.0  --   --   PHOS  --  2.7 2.9 2.9  --   --    Liver Function Tests: Recent Labs  Lab 02/27/23 0006 02/28/23 0304  AST 17 22  ALT 15 14  ALKPHOS 104 90  BILITOT 0.6 0.5  PROT 8.3* 7.2  ALBUMIN 3.3* 2.8*    CBG: Recent Labs  Lab 03/04/23 1148 03/04/23 1701 03/04/23 2126 03/05/23 0742 03/05/23 1153  GLUCAP 126* 133* 135* 90 133*    Discharge time spent: 42 minutes.  Signed: Marcelino Duster, MD [facility] 03/05/2023

## 2023-03-05 NOTE — Progress Notes (Signed)
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 Pt presented to ER secondary to progressive shaking, vomiting; admitted for management of sepsis related to complicated UTI.    PT Comments  Pt was long sitting in bed upon arrival.He is A and O but presents with flat affect. Pleasant and agreeable to session and OOB." Ive only been OOB once since I've been here." This session was performed with +1 assistance however recommend +2 assistance for additional safety. Pt was able to roll L to short sit. Stood EOB 5 x with author blocking feet from sliding. Once pt is able to achieve standing, he is able to improve fwd wt shift but struggles to pick /clear feet. Takes shuffling steps with flexed posture. Was able to step/pivot to recliner but has poor eccentric control with stand> sit. Overall pt tolerated session well. He is severely limited by weakness in all extremities. Pt will benefit from continues skilled PT to maximize his independence and safety with all ADLs. RN aware pt will require +2 assistance to safely return to bed.    If plan is discharge home, recommend the following: Two people to help with walking and/or transfers;Two people to help with bathing/dressing/bathroom;Assistance with cooking/housework;Assistance with feeding;Direct supervision/assist for medications management;Direct supervision/assist for financial management;Assist for transportation;Help with stairs or ramp for entrance     Equipment Recommendations  Other (comment) (Defer to next level of care)       Precautions / Restrictions Precautions Precautions: Fall Restrictions Weight  Bearing Restrictions: No     Mobility  Bed Mobility Overal bed mobility: Needs Assistance Bed Mobility: Supine to Sit, Sidelying to Sit Sidelying to sit: Mod assist, Min assist Supine to sit: Mod assist, Used rails  General bed mobility comments: pt performed log roll L to short siut with increased time requiured + vcs for sequencing. pt struggles to slide hips to EOB.    Transfers Overall transfer level: Needs assistance Equipment used: Rolling walker (2 wheels) Transfers: Sit to/from Stand Sit to Stand: Mod assist, From elevated surface (recommend +2 for additional safety) Stand pivot transfers: Mod assist General transfer comment: Pt performed STS 5 x EOB prior to stand pivot to recliner. Pt has poor standing posture. Author has to block BLE(feet) to prevent feet from sliding forward. Pt tends to have slight posterior push. Did take a few shuffling steps to pivot to recliner but poor quality steps observed with heavy BUE usage on RW    Ambulation/Gait  General Gait Details: pt took a few shuffling steps to recliner however flexed posture + mod assist. Poor eccentric control when sitting. Highly recommend +2 assist for any/all standing activity.   Balance Overall balance assessment: Needs assistance Sitting-balance support: Feet supported, No upper extremity supported Sitting balance-Leahy Scale: Fair     Standing balance support: Bilateral upper extremity supported Standing balance-Leahy Scale: Poor Standing balance comment: pt is high fall risk due to weakness and poor overall balance. reliant on BUE on RW for support     Cognition Arousal: Alert Behavior During Therapy: [facility] for tasks assessed/performed Overall Cognitive Status: No family/caregiver present to determine baseline cognitive functioning    General Comments: Alert and oriented to self, location and general situation; follows simple commands,  pleasant and cooperative               Pertinent Vitals/Pain  Pain Assessment Pain Assessment: No/denies pain     PT Goals (current goals can now be found in the care plan section) Acute Rehab PT Goals Patient Stated Goal: rehab then home Progress towards PT goals: Progressing toward goals    Frequency    Min 1X/week       Co-evaluation     PT goals addressed during session: Mobility/safety with mobility;Balance;Proper use of DME;Strengthening/ROM        AM-PAC PT "6 Clicks" Mobility   Outcome Measure  Help needed turning from your back to your side while in a flat bed without using bedrails?: A Lot Help needed moving from lying on your back to sitting on the side of a flat bed without using bedrails?: A Lot Help needed moving to and from a bed to a chair (including a wheelchair)?: A Lot Help needed standing up from a chair using your arms (e.g., wheelchair or bedside chair)?: A Lot Help needed to walk in hospital room?: Total Help needed climbing 3-5 steps with a railing? : Total 6 Click Score: 10    End of Session   Activity Tolerance: Patient tolerated treatment well Patient left: in chair;with call bell/phone within reach;with chair alarm set Nurse Communication: Mobility status PT Visit Diagnosis: Muscle weakness (generalized) (M62.81);Difficulty in walking, not elsewhere classified (R26.2)     Time: 6213-0865 PT Time Calculation (min) (ACUTE ONLY): 34 min  Charges:    $Therapeutic Activity: 23-37 mins PT General Charges $$ ACUTE PT VISIT: 1 Visit                     Jetta Lout PTA 03/05/23, 9:19 AM

## 2023-03-05 NOTE — TOC Progression Note (Signed)
Transition of Care [facility]) - Progression Note    Patient Details  Name: [name] MRN: [id number] Date of Birth: [birth year]  [facility] CM/SW Contact  Chapman Fitch, RN Phone Number: 03/05/2023, 12:52 PM  Clinical Narrative:     Patient will DC to: [facility] Anticipated DC date: 03/05/23  Family notified: VM left for wife Physiological scientist by: [facility]  Per MD patient ready for DC to . RN, patient, and facility notified of DC. Discharge Summary sent to facility. RN given number for report. DC packet on chart. Ambulance transport requested for patient.  TOC signing off.        Expected Discharge Plan and Services       Living arrangements for the past 2 months: Single Family Home Expected Discharge Date: 03/05/23                                     Social Determinants of Health (SDOH) Interventions SDOH Screenings   Food Insecurity: No Food Insecurity (02/27/2023)  Housing: Low Risk  (02/27/2023)  Transportation Needs: No Transportation Needs (02/27/2023)  Utilities: Not At Risk (02/27/2023)  Depression (PHQ2-9): Medium Risk (12/04/2022)  Financial Resource Strain: Low Risk  (12/31/2020)   Received from [facility]  Physical Activity: Inactive (09/09/2017)  Social Connections: Unknown (09/09/2017)  Stress: No Stress Concern Present (09/09/2017)  Tobacco Use: Low Risk  (02/27/2023)    Readmission Risk Interventions    02/28/2023   11:21 AM 03/23/2022   12:09 PM 02/23/2022    3:44 PM  Readmission Risk Prevention Plan  Transportation Screening Complete Complete Complete  Medication Review Oceanographer) Complete Complete   PCP or Specialist appointment within 3-5 days of discharge Complete Complete Complete  HRI or Home Care Consult Complete Complete Complete  SW Recovery Care/Counseling Consult Complete Complete Complete  Palliative Care Screening Not Applicable Not Applicable Not Applicable  Skilled Nursing  Facility Not Applicable Not Applicable Not Applicable

## 2023-03-05 NOTE — TOC Progression Note (Signed)
Transition of Care [facility]) - Progression Note    Patient Details  Name: [name] MRN: [id number] Date of Birth: 1955-12-07  [facility] CM/SW Contact  Chapman Fitch, RN Phone Number: 03/05/2023, 9:34 AM  Clinical Narrative:        [facility] ID for SNF [id number] valid through 9/5. [facility] notified Message sent to MD to determine if patient is medically stable for discharge     Expected Discharge Plan and Services       Living arrangements for the past 2 months: Single Family Home                                       Social Determinants of Health (SDOH) Interventions SDOH Screenings   Food Insecurity: No Food Insecurity (02/27/2023)  Housing: Low Risk  (02/27/2023)  Transportation Needs: No Transportation Needs (02/27/2023)  Utilities: Not At Risk (02/27/2023)  Depression (PHQ2-9): Medium Risk (12/04/2022)  Financial Resource Strain: Low Risk  (12/31/2020)   Received from [facility]  Physical Activity: Inactive (09/09/2017)  Social Connections: Unknown (09/09/2017)  Stress: No Stress Concern Present (09/09/2017)  Tobacco Use: Low Risk  (02/27/2023)    Readmission Risk Interventions    02/28/2023   11:21 AM 03/23/2022   12:09 PM 02/23/2022    3:44 PM  Readmission Risk Prevention Plan  Transportation Screening Complete Complete Complete  Medication Review Oceanographer) Complete Complete   PCP or Specialist appointment within 3-5 days of discharge Complete Complete Complete  HRI or Home Care Consult Complete Complete Complete  SW Recovery Care/Counseling Consult Complete Complete Complete  Palliative Care Screening Not Applicable Not Applicable Not Applicable  Skilled Nursing Facility Not Applicable Not Applicable Not Applicable

## 2023-03-08 LAB — CULTURE, BLOOD (ROUTINE X 2)
Culture: NO GROWTH
Culture: NO GROWTH

## 2023-03-18 ENCOUNTER — Ambulatory Visit: Payer: Medicare Other | Admitting: Physician Assistant

## 2023-03-25 ENCOUNTER — Ambulatory Visit: Payer: Medicare Other | Admitting: Physician Assistant

## 2023-04-01 ENCOUNTER — Encounter: Payer: Medicare Other | Attending: Physician Assistant | Admitting: Physician Assistant

## 2023-04-01 DIAGNOSIS — L97822 Non-pressure chronic ulcer of other part of left lower leg with fat layer exposed: Secondary | ICD-10-CM | POA: Diagnosis not present

## 2023-04-01 DIAGNOSIS — M6281 Muscle weakness (generalized): Secondary | ICD-10-CM | POA: Diagnosis not present

## 2023-04-01 DIAGNOSIS — E11622 Type 2 diabetes mellitus with other skin ulcer: Secondary | ICD-10-CM | POA: Insufficient documentation

## 2023-04-01 DIAGNOSIS — I10 Essential (primary) hypertension: Secondary | ICD-10-CM | POA: Insufficient documentation

## 2023-04-01 DIAGNOSIS — L89613 Pressure ulcer of right heel, stage 3: Secondary | ICD-10-CM | POA: Insufficient documentation

## 2023-04-01 DIAGNOSIS — L24A0 Irritant contact dermatitis due to friction or contact with body fluids, unspecified: Secondary | ICD-10-CM | POA: Diagnosis not present

## 2023-04-01 DIAGNOSIS — L89154 Pressure ulcer of sacral region, stage 4: Secondary | ICD-10-CM | POA: Diagnosis present

## 2023-04-01 DIAGNOSIS — L98412 Non-pressure chronic ulcer of buttock with fat layer exposed: Secondary | ICD-10-CM | POA: Diagnosis not present

## 2023-04-01 DIAGNOSIS — Z87820 Personal history of traumatic brain injury: Secondary | ICD-10-CM | POA: Diagnosis not present

## 2023-04-01 DIAGNOSIS — F319 Bipolar disorder, unspecified: Secondary | ICD-10-CM | POA: Insufficient documentation

## 2023-04-01 DIAGNOSIS — Z7901 Long term (current) use of anticoagulants: Secondary | ICD-10-CM | POA: Diagnosis not present

## 2023-04-01 NOTE — Progress Notes (Addendum)
is not worse but is also not better. It has been about a month since have seen him as he was septic due to UTI and ended up in rehab following due to being weak. With that being said he does have a new wound on the left lower extremity  which is secondary to having been placed in the wheelchair left they are too long during the time he was in rehab according to his wife. Electronic Signature(s) Signed: 04/01/2023 10:21:03 AM By: Allen Derry PA-C Entered By: Allen Derry on 04/01/2023 07:21:03 -------------------------------------------------------------------------------- Physical Exam Details Patient Name: Date of Service: Ena Dawley [facility] 04/01/2023 8:45 A M Medical Record Number: [id number] Patient Account Number: [id number] Date of Birth/Sex: Treating RN: 04/27/1956 (66 y.o. [name]) [name] Primary Care Provider: Dione Booze Other Clinician: Referring Provider: Treating Provider/Extender: [name] in Treatment: 45 Constitutional Obese and well-hydrated in no acute distress. Respiratory normal breathing without difficulty. Psychiatric this patient is able to make decisions and demonstrates good insight into disease process. Alert and Oriented x 3. pleasant and cooperative. Notes Patient's wounds all 3 appear to be very clean there is no need for sharp debridement today he is actually doing well in that regard. With that being said I did SULIEMAN, FAYETTE ([id number]) [id number] Page 4 of 11 add the new wound in to the left lateral lower extremity. The right heel is doing okay and the sacral wound is still about the same. Electronic Signature(s) Signed: 04/01/2023 10:21:24 AM By: Allen Derry PA-C Entered By: Allen Derry on 04/01/2023 07:21:24 -------------------------------------------------------------------------------- Physician Orders Details Patient Name: Date of Service: Ena Dawley [facility] 04/01/2023 8:45 A M Medical Record Number: [id number] Patient Account Number: [id number] Date of Birth/Sex: Treating RN: [birth year] (66 y.o. [name]) [name] Primary Care Provider: Dione Booze Other Clinician: Referring Provider: Treating  Provider/Extender: [name] in Treatment: 34 Verbal / Phone Orders: No Diagnosis Coding ICD-10 Coding Code Description L89.154 Pressure ulcer of sacral region, stage 4 L89.613 Pressure ulcer of right heel, stage 3 L24.A0 Irritant contact dermatitis due to friction or contact with body fluids, unspecified L98.412 Non-pressure chronic ulcer of buttock with fat layer exposed M62.81 Muscle weakness (generalized) F31.9 Bipolar disorder, unspecified E11.622 Type 2 diabetes mellitus with other skin ulcer I10 Essential (primary) hypertension Z79.01 Long term (current) use of anticoagulants 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 Home Health [facility] for wound care. May utilize formulary equivalent dressing for wound treatment orders unless otherwise specified. Home Health Nurse may visit PRN to address patients wound care needs. Beverly Gust [phone] **Please direct any NON-WOUND related issues/requests for orders to patient's Primary Care Physician. **If current dressing causes regression in wound condition, may D/C ordered dressing product/s and apply Normal Saline Moist Dressing daily until next Wound Healing Center or Other MD appointment. **Notify Wound Healing Center of regression in wound condition at [phone]. Bathing/ Shower/ Hygiene No tub bath. Anesthetic (Use 'Patient Medications' Section for Anesthetic Order Entry) Lidocaine applied to wound bed Edema Control - Lymphedema / Segmental Compressive Device / Other Elevate, Exercise Daily and A void Standing for Long Periods of Time. Elevate legs to the level of the heart and pump ankles as often as possible Elevate leg(s) parallel to the floor when sitting. DO YOUR BEST to sleep in the bed at night. DO NOT sleep in your recliner. Long hours of sitting in a recliner leads to swelling of the legs and/or potential  balls. Cleanser: Soap and Water 1 x Per Day/30 Days Discharge Instructions: Gently cleanse wound with antibacterial soap, rinse and pat dry prior to dressing wounds Topical: calmoseptine 1 x Per Day/30 Days Discharge Instructions: apply to excoriated areas / periwound Prim Dressing: Hydrofera Blue Ready Transfer Foam, 4x5 (in/in) 1  x Per Day/30 Days ary Discharge Instructions: Apply Hydrofera Blue Ready to wound bed as directed Secondary Dressing: ABD Pad 5x9 (in/in) 1 x Per Day/30 Days Discharge Instructions: Cover with ABD pad Secured With: Medipore T - 28M Medipore H Soft Cloth Surgical T ape ape, 2x2 (in/yd) 1 x Per Day/30 Days WOUND #9: - Lower Leg Wound Laterality: Left, Lateral Cleanser: Normal Saline 1 x Per Day/30 Days Discharge Instructions: Wash your hands with soap and water. Remove old dressing, discard into plastic bag and place into trash. Cleanse the wound with Normal Saline prior to applying a clean dressing using gauze sponges, not tissues or cotton balls. Do not scrub or use excessive force. Pat dry using gauze sponges, not tissue or cotton balls. Cleanser: Soap and Water 1 x Per Day/30 Days Discharge Instructions: Gently cleanse wound with antibacterial soap, rinse and pat dry prior to dressing wounds Topical: calmoseptine 1 x Per Day/30 Days Discharge Instructions: apply to excoriated areas / periwound Prim Dressing: Hydrofera Blue Ready Transfer Foam, 4x5 (in/in) 1 x Per Day/30 Days ary Discharge Instructions: Apply Hydrofera Blue Ready to wound bed as directed Secondary Dressing: ABD Pad 5x9 (in/in) 1 x Per Day/30 Days Discharge Instructions: Cover with ABD pad Secured With: Medipore T - 28M Medipore H Soft Cloth Surgical T ape ape, 2x2 (in/yd) 1 x Per Day/30 Days 1. I had a big talk with the patient today yet again about offloading and not sitting for long periods of time the good news is he will be getting a Michiel Sites lift today which I think should help his wife feels being able to more easily move him and reposition him. 2. I am good recommend as well that we have the patient continue with the Calmoseptine and around the wounds keep the [facility] Blue from sticking and then subsequently we are going to continue with [facility]. 3. I am going to recommend that along with the appropriate  offloading he does need to have the appropriate mattress which she does have an air mattress at home as well this is good news. We will see patient back for reevaluation in 1 week here in the clinic. If anything worsens or changes patient will contact our office for additional recommendations. Electronic Signature(s) Signed: 04/01/2023 10:22:08 AM By: Allen Derry PA-C Entered By: Allen Derry on 04/01/2023 07:22:07 Leipold, [name] ([id number]) [id number] Page 11 of 11 -------------------------------------------------------------------------------- SuperBill Details Patient Name: Date of Service: Ena Dawley [facility] 04/01/2023 Medical Record Number: [id number] Patient Account Number: [id number] Date of Birth/Sex: Treating RN: 12-22-1955 (66 y.o. [name]) [name] Primary Care Provider: Dione Booze Other Clinician: Referring Provider: Treating Provider/Extender: [name] in Treatment: 70 Diagnosis Coding ICD-10 Codes Code Description L89.154 Pressure ulcer of sacral region, stage 4 L89.613 Pressure ulcer of right heel, stage 3 L97.822 Non-pressure chronic ulcer of other part of left lower leg with fat layer exposed M62.81 Muscle weakness (generalized) F31.9 Bipolar disorder, unspecified E11.622 Type 2 diabetes mellitus with other skin ulcer I10 Essential (primary) hypertension Z79.01 Long term (current) use of anticoagulants 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 It has been about a month since have seen him as he was septic due to UTI and ended up in rehab following due to being weak. With that being said he does have a new wound on the left lower extremity  which is secondary to having been placed in the wheelchair left they are too long during the time he was in rehab according to his wife. Electronic Signature(s) Signed: 04/01/2023 10:21:03 AM By: Allen Derry PA-C Entered By: Allen Derry on 04/01/2023 07:21:03 -------------------------------------------------------------------------------- Physical Exam Details Patient Name: Date of Service: Ena Dawley [facility] 04/01/2023 8:45 A M Medical Record Number: [id number] Patient Account Number: [id number] Date of Birth/Sex: Treating RN: 04/27/1956 (66 y.o. [name]) [name] Primary Care Provider: Dione Booze Other Clinician: Referring Provider: Treating Provider/Extender: [name] in Treatment: 45 Constitutional Obese and well-hydrated in no acute distress. Respiratory normal breathing without difficulty. Psychiatric this patient is able to make decisions and demonstrates good insight into disease process. Alert and Oriented x 3. pleasant and cooperative. Notes Patient's wounds all 3 appear to be very clean there is no need for sharp debridement today he is actually doing well in that regard. With that being said I did SULIEMAN, FAYETTE ([id number]) [id number] Page 4 of 11 add the new wound in to the left lateral lower extremity. The right heel is doing okay and the sacral wound is still about the same. Electronic Signature(s) Signed: 04/01/2023 10:21:24 AM By: Allen Derry PA-C Entered By: Allen Derry on 04/01/2023 07:21:24 -------------------------------------------------------------------------------- Physician Orders Details Patient Name: Date of Service: Ena Dawley [facility] 04/01/2023 8:45 A M Medical Record Number: [id number] Patient Account Number: [id number] Date of Birth/Sex: Treating RN: [birth year] (66 y.o. [name]) [name] Primary Care Provider: Dione Booze Other Clinician: Referring Provider: Treating  Provider/Extender: [name] in Treatment: 34 Verbal / Phone Orders: No Diagnosis Coding ICD-10 Coding Code Description L89.154 Pressure ulcer of sacral region, stage 4 L89.613 Pressure ulcer of right heel, stage 3 L24.A0 Irritant contact dermatitis due to friction or contact with body fluids, unspecified L98.412 Non-pressure chronic ulcer of buttock with fat layer exposed M62.81 Muscle weakness (generalized) F31.9 Bipolar disorder, unspecified E11.622 Type 2 diabetes mellitus with other skin ulcer I10 Essential (primary) hypertension Z79.01 Long term (current) use of anticoagulants 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 Home Health [facility] for wound care. May utilize formulary equivalent dressing for wound treatment orders unless otherwise specified. Home Health Nurse may visit PRN to address patients wound care needs. Beverly Gust [phone] **Please direct any NON-WOUND related issues/requests for orders to patient's Primary Care Physician. **If current dressing causes regression in wound condition, may D/C ordered dressing product/s and apply Normal Saline Moist Dressing daily until next Wound Healing Center or Other MD appointment. **Notify Wound Healing Center of regression in wound condition at [phone]. Bathing/ Shower/ Hygiene No tub bath. Anesthetic (Use 'Patient Medications' Section for Anesthetic Order Entry) Lidocaine applied to wound bed Edema Control - Lymphedema / Segmental Compressive Device / Other Elevate, Exercise Daily and A void Standing for Long Periods of Time. Elevate legs to the level of the heart and pump ankles as often as possible Elevate leg(s) parallel to the floor when sitting. DO YOUR BEST to sleep in the bed at night. DO NOT sleep in your recliner. Long hours of sitting in a recliner leads to swelling of the legs and/or potential  is not worse but is also not better. It has been about a month since have seen him as he was septic due to UTI and ended up in rehab following due to being weak. With that being said he does have a new wound on the left lower extremity  which is secondary to having been placed in the wheelchair left they are too long during the time he was in rehab according to his wife. Electronic Signature(s) Signed: 04/01/2023 10:21:03 AM By: Allen Derry PA-C Entered By: Allen Derry on 04/01/2023 07:21:03 -------------------------------------------------------------------------------- Physical Exam Details Patient Name: Date of Service: Ena Dawley [facility] 04/01/2023 8:45 A M Medical Record Number: [id number] Patient Account Number: [id number] Date of Birth/Sex: Treating RN: 04/27/1956 (66 y.o. [name]) [name] Primary Care Provider: Dione Booze Other Clinician: Referring Provider: Treating Provider/Extender: [name] in Treatment: 45 Constitutional Obese and well-hydrated in no acute distress. Respiratory normal breathing without difficulty. Psychiatric this patient is able to make decisions and demonstrates good insight into disease process. Alert and Oriented x 3. pleasant and cooperative. Notes Patient's wounds all 3 appear to be very clean there is no need for sharp debridement today he is actually doing well in that regard. With that being said I did SULIEMAN, FAYETTE ([id number]) [id number] Page 4 of 11 add the new wound in to the left lateral lower extremity. The right heel is doing okay and the sacral wound is still about the same. Electronic Signature(s) Signed: 04/01/2023 10:21:24 AM By: Allen Derry PA-C Entered By: Allen Derry on 04/01/2023 07:21:24 -------------------------------------------------------------------------------- Physician Orders Details Patient Name: Date of Service: Ena Dawley [facility] 04/01/2023 8:45 A M Medical Record Number: [id number] Patient Account Number: [id number] Date of Birth/Sex: Treating RN: [birth year] (66 y.o. [name]) [name] Primary Care Provider: Dione Booze Other Clinician: Referring Provider: Treating  Provider/Extender: [name] in Treatment: 34 Verbal / Phone Orders: No Diagnosis Coding ICD-10 Coding Code Description L89.154 Pressure ulcer of sacral region, stage 4 L89.613 Pressure ulcer of right heel, stage 3 L24.A0 Irritant contact dermatitis due to friction or contact with body fluids, unspecified L98.412 Non-pressure chronic ulcer of buttock with fat layer exposed M62.81 Muscle weakness (generalized) F31.9 Bipolar disorder, unspecified E11.622 Type 2 diabetes mellitus with other skin ulcer I10 Essential (primary) hypertension Z79.01 Long term (current) use of anticoagulants 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 Home Health [facility] for wound care. May utilize formulary equivalent dressing for wound treatment orders unless otherwise specified. Home Health Nurse may visit PRN to address patients wound care needs. Beverly Gust [phone] **Please direct any NON-WOUND related issues/requests for orders to patient's Primary Care Physician. **If current dressing causes regression in wound condition, may D/C ordered dressing product/s and apply Normal Saline Moist Dressing daily until next Wound Healing Center or Other MD appointment. **Notify Wound Healing Center of regression in wound condition at [phone]. Bathing/ Shower/ Hygiene No tub bath. Anesthetic (Use 'Patient Medications' Section for Anesthetic Order Entry) Lidocaine applied to wound bed Edema Control - Lymphedema / Segmental Compressive Device / Other Elevate, Exercise Daily and A void Standing for Long Periods of Time. Elevate legs to the level of the heart and pump ankles as often as possible Elevate leg(s) parallel to the floor when sitting. DO YOUR BEST to sleep in the bed at night. DO NOT sleep in your recliner. Long hours of sitting in a recliner leads to swelling of the legs and/or potential  balls. Cleanser: Soap and Water 1 x Per Day/30 Days Discharge Instructions: Gently cleanse wound with antibacterial soap, rinse and pat dry prior to dressing wounds Topical: calmoseptine 1 x Per Day/30 Days Discharge Instructions: apply to excoriated areas / periwound Prim Dressing: Hydrofera Blue Ready Transfer Foam, 4x5 (in/in) 1  x Per Day/30 Days ary Discharge Instructions: Apply Hydrofera Blue Ready to wound bed as directed Secondary Dressing: ABD Pad 5x9 (in/in) 1 x Per Day/30 Days Discharge Instructions: Cover with ABD pad Secured With: Medipore T - 28M Medipore H Soft Cloth Surgical T ape ape, 2x2 (in/yd) 1 x Per Day/30 Days WOUND #9: - Lower Leg Wound Laterality: Left, Lateral Cleanser: Normal Saline 1 x Per Day/30 Days Discharge Instructions: Wash your hands with soap and water. Remove old dressing, discard into plastic bag and place into trash. Cleanse the wound with Normal Saline prior to applying a clean dressing using gauze sponges, not tissues or cotton balls. Do not scrub or use excessive force. Pat dry using gauze sponges, not tissue or cotton balls. Cleanser: Soap and Water 1 x Per Day/30 Days Discharge Instructions: Gently cleanse wound with antibacterial soap, rinse and pat dry prior to dressing wounds Topical: calmoseptine 1 x Per Day/30 Days Discharge Instructions: apply to excoriated areas / periwound Prim Dressing: Hydrofera Blue Ready Transfer Foam, 4x5 (in/in) 1 x Per Day/30 Days ary Discharge Instructions: Apply Hydrofera Blue Ready to wound bed as directed Secondary Dressing: ABD Pad 5x9 (in/in) 1 x Per Day/30 Days Discharge Instructions: Cover with ABD pad Secured With: Medipore T - 28M Medipore H Soft Cloth Surgical T ape ape, 2x2 (in/yd) 1 x Per Day/30 Days 1. I had a big talk with the patient today yet again about offloading and not sitting for long periods of time the good news is he will be getting a Michiel Sites lift today which I think should help his wife feels being able to more easily move him and reposition him. 2. I am good recommend as well that we have the patient continue with the Calmoseptine and around the wounds keep the [facility] Blue from sticking and then subsequently we are going to continue with [facility]. 3. I am going to recommend that along with the appropriate  offloading he does need to have the appropriate mattress which she does have an air mattress at home as well this is good news. We will see patient back for reevaluation in 1 week here in the clinic. If anything worsens or changes patient will contact our office for additional recommendations. Electronic Signature(s) Signed: 04/01/2023 10:22:08 AM By: Allen Derry PA-C Entered By: Allen Derry on 04/01/2023 07:22:07 Leipold, [name] ([id number]) [id number] Page 11 of 11 -------------------------------------------------------------------------------- SuperBill Details Patient Name: Date of Service: Ena Dawley [facility] 04/01/2023 Medical Record Number: [id number] Patient Account Number: [id number] Date of Birth/Sex: Treating RN: 12-22-1955 (66 y.o. [name]) [name] Primary Care Provider: Dione Booze Other Clinician: Referring Provider: Treating Provider/Extender: [name] in Treatment: 70 Diagnosis Coding ICD-10 Codes Code Description L89.154 Pressure ulcer of sacral region, stage 4 L89.613 Pressure ulcer of right heel, stage 3 L97.822 Non-pressure chronic ulcer of other part of left lower leg with fat layer exposed M62.81 Muscle weakness (generalized) F31.9 Bipolar disorder, unspecified E11.622 Type 2 diabetes mellitus with other skin ulcer I10 Essential (primary) hypertension Z79.01 Long term (current) use of anticoagulants 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 [name]) [name] Primary Care Provider: Dione Booze Other Clinician: Referring Provider: Treating Provider/Extender: [name] in Treatment: 24 Active Problems ICD-10 Encounter Code Description Active Date MDM Diagnosis L89.154 Pressure ulcer of sacral region, stage 4 11/22/2021 No Yes L89.613 Pressure ulcer of right heel, stage 3 11/22/2021 No Yes L97.822 Non-pressure chronic ulcer of other part of left lower leg with fat layer exposed10/07/2022 No Yes M62.81 Muscle weakness (generalized) 11/22/2021 No Yes F31.9 Bipolar disorder, unspecified 11/22/2021 No Yes E11.622 Type 2 diabetes mellitus with other skin ulcer 11/22/2021 No Yes I10 Essential (primary) hypertension 11/22/2021 No Yes Z79.01 Long term (current) use of anticoagulants 11/22/2021 No Yes 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 11/22/2021 No Yes Inactive Problems ICD-10 Code Description Active Date Inactive Date L89.623 Pressure ulcer of left heel, stage 3 11/22/2021 11/22/2021 Resolved Problems ICD-10 Code Description Active Date Resolved Date ALYXANDER, MALONZO ([id number]) [id number] Page 7 of 11 L98.412 Non-pressure chronic ulcer of buttock with fat layer exposed 11/22/2021 11/22/2021 L24.A0 Irritant contact dermatitis due to friction or contact with body fluids, unspecified 11/22/2021 11/22/2021 Electronic Signature(s) Signed: 04/01/2023 9:25:22 AM By: Allen Derry PA-C Previous Signature: 04/01/2023 9:19:20 AM Version By: Allen Derry PA-C Entered By: Allen Derry on 04/01/2023  56:38:75 -------------------------------------------------------------------------------- Progress Note Details Patient Name: Date of Service: Ena Dawley [facility] 04/01/2023 8:45 A M Medical Record Number: [id number] Patient Account Number: [id number] Date of Birth/Sex: Treating RN: Nov 03, 1955 (66 y.o. [name]) [name] Primary Care Provider: Dione Booze Other Clinician: Referring Provider: Treating Provider/Extender: [name] in Treatment: 64 Subjective Chief Complaint Information obtained from Patient Sacral, right heel, and left leg ulcers History of Present Illness (HPI) 05/29/2021 this is a patient who presents today for initial inspection here in the clinic concerning wounds that he has over the right heel and the sacral region. Unfortunately the sacral wound is starting to spread off to the right gluteal location due to how he sits always leaning towards the right side in his chair. His wife is present she is the primary caregiver though she is not home with him all the time she does have to work. She does do an excellent job however it appears to be in trying to keep things under good control for him. The patient is not able to change positions himself nor walk by himself so he is pretty much at the mercy of the position he is putting when she is gone and this tends to be his chair which she sits and most of the day. Obviously this I think is the main culprit for what is going on currently. It was actually in January 2020 when the sacral wound started. It was in September 2022 when the wound started to spread more to the right gluteal location. Subsequently in August 2022 is when he had been in a skilled nursing facility and the heel started to give him trouble as well. That does not seem to be doing nearly as poorly as the sacral region. He was hospitalized in October 2022 secondary to sepsis and this was in regard to the foot and was sent to  skilled nursing again he is now back at home. He did have  RN Entered By: [name] on 04/01/2023 06:21:31 Ramos, [name] [id number] Page 6 of  11 -------------------------------------------------------------------------------- Problem List Details Patient Name: Date of Service: [facility] 04/01/2023 8:45 A M Medical Record Number: [id number] Patient Account Number: [id number] Date of Birth/Sex: Treating RN: Sep 22, 1955 (66 y.o. [name]) [name] Primary Care Provider: Dione Booze Other Clinician: Referring Provider: Treating Provider/Extender: [name] in Treatment: 24 Active Problems ICD-10 Encounter Code Description Active Date MDM Diagnosis L89.154 Pressure ulcer of sacral region, stage 4 11/22/2021 No Yes L89.613 Pressure ulcer of right heel, stage 3 11/22/2021 No Yes L97.822 Non-pressure chronic ulcer of other part of left lower leg with fat layer exposed10/07/2022 No Yes M62.81 Muscle weakness (generalized) 11/22/2021 No Yes F31.9 Bipolar disorder, unspecified 11/22/2021 No Yes E11.622 Type 2 diabetes mellitus with other skin ulcer 11/22/2021 No Yes I10 Essential (primary) hypertension 11/22/2021 No Yes Z79.01 Long term (current) use of anticoagulants 11/22/2021 No Yes 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 11/22/2021 No Yes Inactive Problems ICD-10 Code Description Active Date Inactive Date L89.623 Pressure ulcer of left heel, stage 3 11/22/2021 11/22/2021 Resolved Problems ICD-10 Code Description Active Date Resolved Date ALYXANDER, MALONZO ([id number]) [id number] Page 7 of 11 L98.412 Non-pressure chronic ulcer of buttock with fat layer exposed 11/22/2021 11/22/2021 L24.A0 Irritant contact dermatitis due to friction or contact with body fluids, unspecified 11/22/2021 11/22/2021 Electronic Signature(s) Signed: 04/01/2023 9:25:22 AM By: Allen Derry PA-C Previous Signature: 04/01/2023 9:19:20 AM Version By: Allen Derry PA-C Entered By: Allen Derry on 04/01/2023  56:38:75 -------------------------------------------------------------------------------- Progress Note Details Patient Name: Date of Service: Ena Dawley [facility] 04/01/2023 8:45 A M Medical Record Number: [id number] Patient Account Number: [id number] Date of Birth/Sex: Treating RN: Nov 03, 1955 (66 y.o. [name]) [name] Primary Care Provider: Dione Booze Other Clinician: Referring Provider: Treating Provider/Extender: [name] in Treatment: 64 Subjective Chief Complaint Information obtained from Patient Sacral, right heel, and left leg ulcers History of Present Illness (HPI) 05/29/2021 this is a patient who presents today for initial inspection here in the clinic concerning wounds that he has over the right heel and the sacral region. Unfortunately the sacral wound is starting to spread off to the right gluteal location due to how he sits always leaning towards the right side in his chair. His wife is present she is the primary caregiver though she is not home with him all the time she does have to work. She does do an excellent job however it appears to be in trying to keep things under good control for him. The patient is not able to change positions himself nor walk by himself so he is pretty much at the mercy of the position he is putting when she is gone and this tends to be his chair which she sits and most of the day. Obviously this I think is the main culprit for what is going on currently. It was actually in January 2020 when the sacral wound started. It was in September 2022 when the wound started to spread more to the right gluteal location. Subsequently in August 2022 is when he had been in a skilled nursing facility and the heel started to give him trouble as well. That does not seem to be doing nearly as poorly as the sacral region. He was hospitalized in October 2022 secondary to sepsis and this was in regard to the foot and was sent to  skilled nursing again he is now back at home. He did have  RN Entered By: [name] on 04/01/2023 06:21:31 Ramos, [name] [id number] Page 6 of  11 -------------------------------------------------------------------------------- Problem List Details Patient Name: Date of Service: [facility] 04/01/2023 8:45 A M Medical Record Number: [id number] Patient Account Number: [id number] Date of Birth/Sex: Treating RN: Sep 22, 1955 (66 y.o. [name]) [name] Primary Care Provider: Dione Booze Other Clinician: Referring Provider: Treating Provider/Extender: [name] in Treatment: 24 Active Problems ICD-10 Encounter Code Description Active Date MDM Diagnosis L89.154 Pressure ulcer of sacral region, stage 4 11/22/2021 No Yes L89.613 Pressure ulcer of right heel, stage 3 11/22/2021 No Yes L97.822 Non-pressure chronic ulcer of other part of left lower leg with fat layer exposed10/07/2022 No Yes M62.81 Muscle weakness (generalized) 11/22/2021 No Yes F31.9 Bipolar disorder, unspecified 11/22/2021 No Yes E11.622 Type 2 diabetes mellitus with other skin ulcer 11/22/2021 No Yes I10 Essential (primary) hypertension 11/22/2021 No Yes Z79.01 Long term (current) use of anticoagulants 11/22/2021 No Yes 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 11/22/2021 No Yes Inactive Problems ICD-10 Code Description Active Date Inactive Date L89.623 Pressure ulcer of left heel, stage 3 11/22/2021 11/22/2021 Resolved Problems ICD-10 Code Description Active Date Resolved Date ALYXANDER, MALONZO ([id number]) [id number] Page 7 of 11 L98.412 Non-pressure chronic ulcer of buttock with fat layer exposed 11/22/2021 11/22/2021 L24.A0 Irritant contact dermatitis due to friction or contact with body fluids, unspecified 11/22/2021 11/22/2021 Electronic Signature(s) Signed: 04/01/2023 9:25:22 AM By: Allen Derry PA-C Previous Signature: 04/01/2023 9:19:20 AM Version By: Allen Derry PA-C Entered By: Allen Derry on 04/01/2023  56:38:75 -------------------------------------------------------------------------------- Progress Note Details Patient Name: Date of Service: Ena Dawley [facility] 04/01/2023 8:45 A M Medical Record Number: [id number] Patient Account Number: [id number] Date of Birth/Sex: Treating RN: Nov 03, 1955 (66 y.o. [name]) [name] Primary Care Provider: Dione Booze Other Clinician: Referring Provider: Treating Provider/Extender: [name] in Treatment: 64 Subjective Chief Complaint Information obtained from Patient Sacral, right heel, and left leg ulcers History of Present Illness (HPI) 05/29/2021 this is a patient who presents today for initial inspection here in the clinic concerning wounds that he has over the right heel and the sacral region. Unfortunately the sacral wound is starting to spread off to the right gluteal location due to how he sits always leaning towards the right side in his chair. His wife is present she is the primary caregiver though she is not home with him all the time she does have to work. She does do an excellent job however it appears to be in trying to keep things under good control for him. The patient is not able to change positions himself nor walk by himself so he is pretty much at the mercy of the position he is putting when she is gone and this tends to be his chair which she sits and most of the day. Obviously this I think is the main culprit for what is going on currently. It was actually in January 2020 when the sacral wound started. It was in September 2022 when the wound started to spread more to the right gluteal location. Subsequently in August 2022 is when he had been in a skilled nursing facility and the heel started to give him trouble as well. That does not seem to be doing nearly as poorly as the sacral region. He was hospitalized in October 2022 secondary to sepsis and this was in regard to the foot and was sent to  skilled nursing again he is now back at home. He did have  TRAESHAWN, ALBANY ([id number]) [id number] Page 1 of 11 Visit Report for 04/01/2023 Chief Complaint Document Details Patient Name: Date of Service: Ena Dawley [facility] 04/01/2023 8:45 A M Medical Record Number: [id number] Patient Account Number: [id number] Date of Birth/Sex: Treating RN: 11/26/1955 (66 y.o. [name]) [name] Primary Care Provider: Dione Booze Other Clinician: Referring Provider: Treating Provider/Extender: [name] in Treatment: 54 Information Obtained from: Patient Chief Complaint Sacral, right heel, and left leg ulcers Electronic Signature(s) Signed: 04/01/2023 9:25:51 AM By: Allen Derry PA-C Previous Signature: 04/01/2023 9:19:24 AM Version By: Allen Derry PA-C Entered By: Allen Derry on 04/01/2023 06:25:51 -------------------------------------------------------------------------------- HPI Details Patient Name: Date of Service: Ena Dawley [facility] 04/01/2023 8:45 A M Medical Record Number: [id number] Patient Account Number: [id number] Date of Birth/Sex: Treating RN: September 25, 1955 (66 y.o. [name]) [name] Primary Care Provider: Dione Booze Other Clinician: Referring Provider: Treating Provider/Extender: [name] in Treatment: 23 History of Present Illness HPI Description: 05/29/2021 this is a patient who presents today for initial inspection here in the clinic concerning wounds that he has over the right heel and the sacral region. Unfortunately the sacral wound is starting to spread off to the right gluteal location due to how he sits always leaning towards the right side in his chair. His wife is present she is the primary caregiver though she is not home with him all the time she does have to work. She does do an excellent job however it appears to be in trying to keep things under good control for him. The patient is not able to change positions himself nor walk by  himself so he is pretty much at the mercy of the position he is putting when she is gone and this tends to be his chair which she sits and most of the day. Obviously this I think is the main culprit for what is going on currently. It was actually in January 2020 when the sacral wound started. It was in September 2022 when the wound started to spread more to the right gluteal location. Subsequently in August 2022 is when he had been in a skilled nursing facility and the heel started to give him trouble as well. That does not seem to be doing nearly as poorly as the sacral region. He was hospitalized in October 2022 secondary to sepsis and this was in regard to the foot and was sent to skilled nursing again he is now back at home. He did have a wound VAC for the sacral wound over the summer 2022 but being in and out of facilities this ended up getting sent back. The patient does have [facility] home health that comes out 1 time per week to help with care. His most recent hemoglobin A1c was 6.9 in August 2022. 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 s. FRITZ, LICEA ([id number]) [id number] Page 2 of 11 07/24/2021 upon evaluation today patient appears to be doing decently well in regard to the pressure ulcer on the right heel as well as the sacral region. In general I think you are making some progress here which is great news. Overall the heel unfortunately had already closed previously when we saw him although it apparently reopened when he was working with physical therapy according to his wife. The area in the sacral region is doing  RN Entered By: [name] on 04/01/2023 06:21:31 Ramos, [name] [id number] Page 6 of  11 -------------------------------------------------------------------------------- Problem List Details Patient Name: Date of Service: [facility] 04/01/2023 8:45 A M Medical Record Number: [id number] Patient Account Number: [id number] Date of Birth/Sex: Treating RN: Sep 22, 1955 (66 y.o. [name]) [name] Primary Care Provider: Dione Booze Other Clinician: Referring Provider: Treating Provider/Extender: [name] in Treatment: 24 Active Problems ICD-10 Encounter Code Description Active Date MDM Diagnosis L89.154 Pressure ulcer of sacral region, stage 4 11/22/2021 No Yes L89.613 Pressure ulcer of right heel, stage 3 11/22/2021 No Yes L97.822 Non-pressure chronic ulcer of other part of left lower leg with fat layer exposed10/07/2022 No Yes M62.81 Muscle weakness (generalized) 11/22/2021 No Yes F31.9 Bipolar disorder, unspecified 11/22/2021 No Yes E11.622 Type 2 diabetes mellitus with other skin ulcer 11/22/2021 No Yes I10 Essential (primary) hypertension 11/22/2021 No Yes Z79.01 Long term (current) use of anticoagulants 11/22/2021 No Yes 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 11/22/2021 No Yes Inactive Problems ICD-10 Code Description Active Date Inactive Date L89.623 Pressure ulcer of left heel, stage 3 11/22/2021 11/22/2021 Resolved Problems ICD-10 Code Description Active Date Resolved Date ALYXANDER, MALONZO ([id number]) [id number] Page 7 of 11 L98.412 Non-pressure chronic ulcer of buttock with fat layer exposed 11/22/2021 11/22/2021 L24.A0 Irritant contact dermatitis due to friction or contact with body fluids, unspecified 11/22/2021 11/22/2021 Electronic Signature(s) Signed: 04/01/2023 9:25:22 AM By: Allen Derry PA-C Previous Signature: 04/01/2023 9:19:20 AM Version By: Allen Derry PA-C Entered By: Allen Derry on 04/01/2023  56:38:75 -------------------------------------------------------------------------------- Progress Note Details Patient Name: Date of Service: Ena Dawley [facility] 04/01/2023 8:45 A M Medical Record Number: [id number] Patient Account Number: [id number] Date of Birth/Sex: Treating RN: Nov 03, 1955 (66 y.o. [name]) [name] Primary Care Provider: Dione Booze Other Clinician: Referring Provider: Treating Provider/Extender: [name] in Treatment: 64 Subjective Chief Complaint Information obtained from Patient Sacral, right heel, and left leg ulcers History of Present Illness (HPI) 05/29/2021 this is a patient who presents today for initial inspection here in the clinic concerning wounds that he has over the right heel and the sacral region. Unfortunately the sacral wound is starting to spread off to the right gluteal location due to how he sits always leaning towards the right side in his chair. His wife is present she is the primary caregiver though she is not home with him all the time she does have to work. She does do an excellent job however it appears to be in trying to keep things under good control for him. The patient is not able to change positions himself nor walk by himself so he is pretty much at the mercy of the position he is putting when she is gone and this tends to be his chair which she sits and most of the day. Obviously this I think is the main culprit for what is going on currently. It was actually in January 2020 when the sacral wound started. It was in September 2022 when the wound started to spread more to the right gluteal location. Subsequently in August 2022 is when he had been in a skilled nursing facility and the heel started to give him trouble as well. That does not seem to be doing nearly as poorly as the sacral region. He was hospitalized in October 2022 secondary to sepsis and this was in regard to the foot and was sent to  skilled nursing again he is now back at home. He did have  is not worse but is also not better. It has been about a month since have seen him as he was septic due to UTI and ended up in rehab following due to being weak. With that being said he does have a new wound on the left lower extremity  which is secondary to having been placed in the wheelchair left they are too long during the time he was in rehab according to his wife. Electronic Signature(s) Signed: 04/01/2023 10:21:03 AM By: Allen Derry PA-C Entered By: Allen Derry on 04/01/2023 07:21:03 -------------------------------------------------------------------------------- Physical Exam Details Patient Name: Date of Service: Ena Dawley [facility] 04/01/2023 8:45 A M Medical Record Number: [id number] Patient Account Number: [id number] Date of Birth/Sex: Treating RN: 04/27/1956 (66 y.o. [name]) [name] Primary Care Provider: Dione Booze Other Clinician: Referring Provider: Treating Provider/Extender: [name] in Treatment: 45 Constitutional Obese and well-hydrated in no acute distress. Respiratory normal breathing without difficulty. Psychiatric this patient is able to make decisions and demonstrates good insight into disease process. Alert and Oriented x 3. pleasant and cooperative. Notes Patient's wounds all 3 appear to be very clean there is no need for sharp debridement today he is actually doing well in that regard. With that being said I did SULIEMAN, FAYETTE ([id number]) [id number] Page 4 of 11 add the new wound in to the left lateral lower extremity. The right heel is doing okay and the sacral wound is still about the same. Electronic Signature(s) Signed: 04/01/2023 10:21:24 AM By: Allen Derry PA-C Entered By: Allen Derry on 04/01/2023 07:21:24 -------------------------------------------------------------------------------- Physician Orders Details Patient Name: Date of Service: Ena Dawley [facility] 04/01/2023 8:45 A M Medical Record Number: [id number] Patient Account Number: [id number] Date of Birth/Sex: Treating RN: [birth year] (66 y.o. [name]) [name] Primary Care Provider: Dione Booze Other Clinician: Referring Provider: Treating  Provider/Extender: [name] in Treatment: 34 Verbal / Phone Orders: No Diagnosis Coding ICD-10 Coding Code Description L89.154 Pressure ulcer of sacral region, stage 4 L89.613 Pressure ulcer of right heel, stage 3 L24.A0 Irritant contact dermatitis due to friction or contact with body fluids, unspecified L98.412 Non-pressure chronic ulcer of buttock with fat layer exposed M62.81 Muscle weakness (generalized) F31.9 Bipolar disorder, unspecified E11.622 Type 2 diabetes mellitus with other skin ulcer I10 Essential (primary) hypertension Z79.01 Long term (current) use of anticoagulants 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 Home Health [facility] for wound care. May utilize formulary equivalent dressing for wound treatment orders unless otherwise specified. Home Health Nurse may visit PRN to address patients wound care needs. Beverly Gust [phone] **Please direct any NON-WOUND related issues/requests for orders to patient's Primary Care Physician. **If current dressing causes regression in wound condition, may D/C ordered dressing product/s and apply Normal Saline Moist Dressing daily until next Wound Healing Center or Other MD appointment. **Notify Wound Healing Center of regression in wound condition at [phone]. Bathing/ Shower/ Hygiene No tub bath. Anesthetic (Use 'Patient Medications' Section for Anesthetic Order Entry) Lidocaine applied to wound bed Edema Control - Lymphedema / Segmental Compressive Device / Other Elevate, Exercise Daily and A void Standing for Long Periods of Time. Elevate legs to the level of the heart and pump ankles as often as possible Elevate leg(s) parallel to the floor when sitting. DO YOUR BEST to sleep in the bed at night. DO NOT sleep in your recliner. Long hours of sitting in a recliner leads to swelling of the legs and/or potential  balls. Cleanser: Soap and Water 1 x Per Day/30 Days Discharge Instructions: Gently cleanse wound with antibacterial soap, rinse and pat dry prior to dressing wounds Topical: calmoseptine 1 x Per Day/30 Days Discharge Instructions: apply to excoriated areas / periwound Prim Dressing: Hydrofera Blue Ready Transfer Foam, 4x5 (in/in) 1  x Per Day/30 Days ary Discharge Instructions: Apply Hydrofera Blue Ready to wound bed as directed Secondary Dressing: ABD Pad 5x9 (in/in) 1 x Per Day/30 Days Discharge Instructions: Cover with ABD pad Secured With: Medipore T - 28M Medipore H Soft Cloth Surgical T ape ape, 2x2 (in/yd) 1 x Per Day/30 Days WOUND #9: - Lower Leg Wound Laterality: Left, Lateral Cleanser: Normal Saline 1 x Per Day/30 Days Discharge Instructions: Wash your hands with soap and water. Remove old dressing, discard into plastic bag and place into trash. Cleanse the wound with Normal Saline prior to applying a clean dressing using gauze sponges, not tissues or cotton balls. Do not scrub or use excessive force. Pat dry using gauze sponges, not tissue or cotton balls. Cleanser: Soap and Water 1 x Per Day/30 Days Discharge Instructions: Gently cleanse wound with antibacterial soap, rinse and pat dry prior to dressing wounds Topical: calmoseptine 1 x Per Day/30 Days Discharge Instructions: apply to excoriated areas / periwound Prim Dressing: Hydrofera Blue Ready Transfer Foam, 4x5 (in/in) 1 x Per Day/30 Days ary Discharge Instructions: Apply Hydrofera Blue Ready to wound bed as directed Secondary Dressing: ABD Pad 5x9 (in/in) 1 x Per Day/30 Days Discharge Instructions: Cover with ABD pad Secured With: Medipore T - 28M Medipore H Soft Cloth Surgical T ape ape, 2x2 (in/yd) 1 x Per Day/30 Days 1. I had a big talk with the patient today yet again about offloading and not sitting for long periods of time the good news is he will be getting a Michiel Sites lift today which I think should help his wife feels being able to more easily move him and reposition him. 2. I am good recommend as well that we have the patient continue with the Calmoseptine and around the wounds keep the [facility] Blue from sticking and then subsequently we are going to continue with [facility]. 3. I am going to recommend that along with the appropriate  offloading he does need to have the appropriate mattress which she does have an air mattress at home as well this is good news. We will see patient back for reevaluation in 1 week here in the clinic. If anything worsens or changes patient will contact our office for additional recommendations. Electronic Signature(s) Signed: 04/01/2023 10:22:08 AM By: Allen Derry PA-C Entered By: Allen Derry on 04/01/2023 07:22:07 Leipold, [name] ([id number]) [id number] Page 11 of 11 -------------------------------------------------------------------------------- SuperBill Details Patient Name: Date of Service: Ena Dawley [facility] 04/01/2023 Medical Record Number: [id number] Patient Account Number: [id number] Date of Birth/Sex: Treating RN: 12-22-1955 (66 y.o. [name]) [name] Primary Care Provider: Dione Booze Other Clinician: Referring Provider: Treating Provider/Extender: [name] in Treatment: 70 Diagnosis Coding ICD-10 Codes Code Description L89.154 Pressure ulcer of sacral region, stage 4 L89.613 Pressure ulcer of right heel, stage 3 L97.822 Non-pressure chronic ulcer of other part of left lower leg with fat layer exposed M62.81 Muscle weakness (generalized) F31.9 Bipolar disorder, unspecified E11.622 Type 2 diabetes mellitus with other skin ulcer I10 Essential (primary) hypertension Z79.01 Long term (current) use of anticoagulants 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 Facility Procedures : CPT4 Code: [id number] Description: 99214 - WOUND CARE VISIT-LEV 4 EST PT Modifier:  TRAESHAWN, ALBANY ([id number]) [id number] Page 1 of 11 Visit Report for 04/01/2023 Chief Complaint Document Details Patient Name: Date of Service: Ena Dawley [facility] 04/01/2023 8:45 A M Medical Record Number: [id number] Patient Account Number: [id number] Date of Birth/Sex: Treating RN: 11/26/1955 (66 y.o. [name]) [name] Primary Care Provider: Dione Booze Other Clinician: Referring Provider: Treating Provider/Extender: [name] in Treatment: 54 Information Obtained from: Patient Chief Complaint Sacral, right heel, and left leg ulcers Electronic Signature(s) Signed: 04/01/2023 9:25:51 AM By: Allen Derry PA-C Previous Signature: 04/01/2023 9:19:24 AM Version By: Allen Derry PA-C Entered By: Allen Derry on 04/01/2023 06:25:51 -------------------------------------------------------------------------------- HPI Details Patient Name: Date of Service: Ena Dawley [facility] 04/01/2023 8:45 A M Medical Record Number: [id number] Patient Account Number: [id number] Date of Birth/Sex: Treating RN: September 25, 1955 (66 y.o. [name]) [name] Primary Care Provider: Dione Booze Other Clinician: Referring Provider: Treating Provider/Extender: [name] in Treatment: 23 History of Present Illness HPI Description: 05/29/2021 this is a patient who presents today for initial inspection here in the clinic concerning wounds that he has over the right heel and the sacral region. Unfortunately the sacral wound is starting to spread off to the right gluteal location due to how he sits always leaning towards the right side in his chair. His wife is present she is the primary caregiver though she is not home with him all the time she does have to work. She does do an excellent job however it appears to be in trying to keep things under good control for him. The patient is not able to change positions himself nor walk by  himself so he is pretty much at the mercy of the position he is putting when she is gone and this tends to be his chair which she sits and most of the day. Obviously this I think is the main culprit for what is going on currently. It was actually in January 2020 when the sacral wound started. It was in September 2022 when the wound started to spread more to the right gluteal location. Subsequently in August 2022 is when he had been in a skilled nursing facility and the heel started to give him trouble as well. That does not seem to be doing nearly as poorly as the sacral region. He was hospitalized in October 2022 secondary to sepsis and this was in regard to the foot and was sent to skilled nursing again he is now back at home. He did have a wound VAC for the sacral wound over the summer 2022 but being in and out of facilities this ended up getting sent back. The patient does have [facility] home health that comes out 1 time per week to help with care. His most recent hemoglobin A1c was 6.9 in August 2022. 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 s. FRITZ, LICEA ([id number]) [id number] Page 2 of 11 07/24/2021 upon evaluation today patient appears to be doing decently well in regard to the pressure ulcer on the right heel as well as the sacral region. In general I think you are making some progress here which is great news. Overall the heel unfortunately had already closed previously when we saw him although it apparently reopened when he was working with physical therapy according to his wife. The area in the sacral region is doing  TRAESHAWN, ALBANY ([id number]) [id number] Page 1 of 11 Visit Report for 04/01/2023 Chief Complaint Document Details Patient Name: Date of Service: Ena Dawley [facility] 04/01/2023 8:45 A M Medical Record Number: [id number] Patient Account Number: [id number] Date of Birth/Sex: Treating RN: 11/26/1955 (66 y.o. [name]) [name] Primary Care Provider: Dione Booze Other Clinician: Referring Provider: Treating Provider/Extender: [name] in Treatment: 54 Information Obtained from: Patient Chief Complaint Sacral, right heel, and left leg ulcers Electronic Signature(s) Signed: 04/01/2023 9:25:51 AM By: Allen Derry PA-C Previous Signature: 04/01/2023 9:19:24 AM Version By: Allen Derry PA-C Entered By: Allen Derry on 04/01/2023 06:25:51 -------------------------------------------------------------------------------- HPI Details Patient Name: Date of Service: Ena Dawley [facility] 04/01/2023 8:45 A M Medical Record Number: [id number] Patient Account Number: [id number] Date of Birth/Sex: Treating RN: September 25, 1955 (66 y.o. [name]) [name] Primary Care Provider: Dione Booze Other Clinician: Referring Provider: Treating Provider/Extender: [name] in Treatment: 23 History of Present Illness HPI Description: 05/29/2021 this is a patient who presents today for initial inspection here in the clinic concerning wounds that he has over the right heel and the sacral region. Unfortunately the sacral wound is starting to spread off to the right gluteal location due to how he sits always leaning towards the right side in his chair. His wife is present she is the primary caregiver though she is not home with him all the time she does have to work. She does do an excellent job however it appears to be in trying to keep things under good control for him. The patient is not able to change positions himself nor walk by  himself so he is pretty much at the mercy of the position he is putting when she is gone and this tends to be his chair which she sits and most of the day. Obviously this I think is the main culprit for what is going on currently. It was actually in January 2020 when the sacral wound started. It was in September 2022 when the wound started to spread more to the right gluteal location. Subsequently in August 2022 is when he had been in a skilled nursing facility and the heel started to give him trouble as well. That does not seem to be doing nearly as poorly as the sacral region. He was hospitalized in October 2022 secondary to sepsis and this was in regard to the foot and was sent to skilled nursing again he is now back at home. He did have a wound VAC for the sacral wound over the summer 2022 but being in and out of facilities this ended up getting sent back. The patient does have [facility] home health that comes out 1 time per week to help with care. His most recent hemoglobin A1c was 6.9 in August 2022. 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 s. FRITZ, LICEA ([id number]) [id number] Page 2 of 11 07/24/2021 upon evaluation today patient appears to be doing decently well in regard to the pressure ulcer on the right heel as well as the sacral region. In general I think you are making some progress here which is great news. Overall the heel unfortunately had already closed previously when we saw him although it apparently reopened when he was working with physical therapy according to his wife. The area in the sacral region is doing  balls. Cleanser: Soap and Water 1 x Per Day/30 Days Discharge Instructions: Gently cleanse wound with antibacterial soap, rinse and pat dry prior to dressing wounds Topical: calmoseptine 1 x Per Day/30 Days Discharge Instructions: apply to excoriated areas / periwound Prim Dressing: Hydrofera Blue Ready Transfer Foam, 4x5 (in/in) 1  x Per Day/30 Days ary Discharge Instructions: Apply Hydrofera Blue Ready to wound bed as directed Secondary Dressing: ABD Pad 5x9 (in/in) 1 x Per Day/30 Days Discharge Instructions: Cover with ABD pad Secured With: Medipore T - 28M Medipore H Soft Cloth Surgical T ape ape, 2x2 (in/yd) 1 x Per Day/30 Days WOUND #9: - Lower Leg Wound Laterality: Left, Lateral Cleanser: Normal Saline 1 x Per Day/30 Days Discharge Instructions: Wash your hands with soap and water. Remove old dressing, discard into plastic bag and place into trash. Cleanse the wound with Normal Saline prior to applying a clean dressing using gauze sponges, not tissues or cotton balls. Do not scrub or use excessive force. Pat dry using gauze sponges, not tissue or cotton balls. Cleanser: Soap and Water 1 x Per Day/30 Days Discharge Instructions: Gently cleanse wound with antibacterial soap, rinse and pat dry prior to dressing wounds Topical: calmoseptine 1 x Per Day/30 Days Discharge Instructions: apply to excoriated areas / periwound Prim Dressing: Hydrofera Blue Ready Transfer Foam, 4x5 (in/in) 1 x Per Day/30 Days ary Discharge Instructions: Apply Hydrofera Blue Ready to wound bed as directed Secondary Dressing: ABD Pad 5x9 (in/in) 1 x Per Day/30 Days Discharge Instructions: Cover with ABD pad Secured With: Medipore T - 28M Medipore H Soft Cloth Surgical T ape ape, 2x2 (in/yd) 1 x Per Day/30 Days 1. I had a big talk with the patient today yet again about offloading and not sitting for long periods of time the good news is he will be getting a Michiel Sites lift today which I think should help his wife feels being able to more easily move him and reposition him. 2. I am good recommend as well that we have the patient continue with the Calmoseptine and around the wounds keep the [facility] Blue from sticking and then subsequently we are going to continue with [facility]. 3. I am going to recommend that along with the appropriate  offloading he does need to have the appropriate mattress which she does have an air mattress at home as well this is good news. We will see patient back for reevaluation in 1 week here in the clinic. If anything worsens or changes patient will contact our office for additional recommendations. Electronic Signature(s) Signed: 04/01/2023 10:22:08 AM By: Allen Derry PA-C Entered By: Allen Derry on 04/01/2023 07:22:07 Leipold, [name] ([id number]) [id number] Page 11 of 11 -------------------------------------------------------------------------------- SuperBill Details Patient Name: Date of Service: Ena Dawley [facility] 04/01/2023 Medical Record Number: [id number] Patient Account Number: [id number] Date of Birth/Sex: Treating RN: 12-22-1955 (66 y.o. [name]) [name] Primary Care Provider: Dione Booze Other Clinician: Referring Provider: Treating Provider/Extender: [name] in Treatment: 70 Diagnosis Coding ICD-10 Codes Code Description L89.154 Pressure ulcer of sacral region, stage 4 L89.613 Pressure ulcer of right heel, stage 3 L97.822 Non-pressure chronic ulcer of other part of left lower leg with fat layer exposed M62.81 Muscle weakness (generalized) F31.9 Bipolar disorder, unspecified E11.622 Type 2 diabetes mellitus with other skin ulcer I10 Essential (primary) hypertension Z79.01 Long term (current) use of anticoagulants 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 It has been about a month since have seen him as he was septic due to UTI and ended up in rehab following due to being weak. With that being said he does have a new wound on the left lower extremity  which is secondary to having been placed in the wheelchair left they are too long during the time he was in rehab according to his wife. Electronic Signature(s) Signed: 04/01/2023 10:21:03 AM By: Allen Derry PA-C Entered By: Allen Derry on 04/01/2023 07:21:03 -------------------------------------------------------------------------------- Physical Exam Details Patient Name: Date of Service: Ena Dawley [facility] 04/01/2023 8:45 A M Medical Record Number: [id number] Patient Account Number: [id number] Date of Birth/Sex: Treating RN: 04/27/1956 (66 y.o. [name]) [name] Primary Care Provider: Dione Booze Other Clinician: Referring Provider: Treating Provider/Extender: [name] in Treatment: 45 Constitutional Obese and well-hydrated in no acute distress. Respiratory normal breathing without difficulty. Psychiatric this patient is able to make decisions and demonstrates good insight into disease process. Alert and Oriented x 3. pleasant and cooperative. Notes Patient's wounds all 3 appear to be very clean there is no need for sharp debridement today he is actually doing well in that regard. With that being said I did SULIEMAN, FAYETTE ([id number]) [id number] Page 4 of 11 add the new wound in to the left lateral lower extremity. The right heel is doing okay and the sacral wound is still about the same. Electronic Signature(s) Signed: 04/01/2023 10:21:24 AM By: Allen Derry PA-C Entered By: Allen Derry on 04/01/2023 07:21:24 -------------------------------------------------------------------------------- Physician Orders Details Patient Name: Date of Service: Ena Dawley [facility] 04/01/2023 8:45 A M Medical Record Number: [id number] Patient Account Number: [id number] Date of Birth/Sex: Treating RN: [birth year] (66 y.o. [name]) [name] Primary Care Provider: Dione Booze Other Clinician: Referring Provider: Treating  Provider/Extender: [name] in Treatment: 34 Verbal / Phone Orders: No Diagnosis Coding ICD-10 Coding Code Description L89.154 Pressure ulcer of sacral region, stage 4 L89.613 Pressure ulcer of right heel, stage 3 L24.A0 Irritant contact dermatitis due to friction or contact with body fluids, unspecified L98.412 Non-pressure chronic ulcer of buttock with fat layer exposed M62.81 Muscle weakness (generalized) F31.9 Bipolar disorder, unspecified E11.622 Type 2 diabetes mellitus with other skin ulcer I10 Essential (primary) hypertension Z79.01 Long term (current) use of anticoagulants 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 Home Health [facility] for wound care. May utilize formulary equivalent dressing for wound treatment orders unless otherwise specified. Home Health Nurse may visit PRN to address patients wound care needs. Beverly Gust [phone] **Please direct any NON-WOUND related issues/requests for orders to patient's Primary Care Physician. **If current dressing causes regression in wound condition, may D/C ordered dressing product/s and apply Normal Saline Moist Dressing daily until next Wound Healing Center or Other MD appointment. **Notify Wound Healing Center of regression in wound condition at [phone]. Bathing/ Shower/ Hygiene No tub bath. Anesthetic (Use 'Patient Medications' Section for Anesthetic Order Entry) Lidocaine applied to wound bed Edema Control - Lymphedema / Segmental Compressive Device / Other Elevate, Exercise Daily and A void Standing for Long Periods of Time. Elevate legs to the level of the heart and pump ankles as often as possible Elevate leg(s) parallel to the floor when sitting. DO YOUR BEST to sleep in the bed at night. DO NOT sleep in your recliner. Long hours of sitting in a recliner leads to swelling of the legs and/or potential

## 2023-04-11 ENCOUNTER — Inpatient Hospital Stay
Admission: EM | Admit: 2023-04-11 | Discharge: 2023-04-19 | DRG: 698 | Disposition: A | Discharge status: Home Health | Payer: Medicare Other | Attending: Internal Medicine | Admitting: Internal Medicine

## 2023-04-11 ENCOUNTER — Other Ambulatory Visit: Payer: Self-pay

## 2023-04-11 ENCOUNTER — Emergency Department: Payer: Medicare Other

## 2023-04-11 DIAGNOSIS — Z7984 Long term (current) use of oral hypoglycemic drugs: Secondary | ICD-10-CM

## 2023-04-11 DIAGNOSIS — L89613 Pressure ulcer of right heel, stage 3: Secondary | ICD-10-CM | POA: Diagnosis present

## 2023-04-11 DIAGNOSIS — N390 Urinary tract infection, site not specified: Principal | ICD-10-CM | POA: Diagnosis present

## 2023-04-11 DIAGNOSIS — A498 Other bacterial infections of unspecified site: Secondary | ICD-10-CM

## 2023-04-11 DIAGNOSIS — Z433 Encounter for attention to colostomy: Secondary | ICD-10-CM

## 2023-04-11 DIAGNOSIS — Z933 Colostomy status: Secondary | ICD-10-CM

## 2023-04-11 DIAGNOSIS — Z79899 Other long term (current) drug therapy: Secondary | ICD-10-CM

## 2023-04-11 DIAGNOSIS — Z86718 Personal history of other venous thrombosis and embolism: Secondary | ICD-10-CM

## 2023-04-11 DIAGNOSIS — T83518A Infection and inflammatory reaction due to other urinary catheter, initial encounter: Secondary | ICD-10-CM | POA: Diagnosis not present

## 2023-04-11 DIAGNOSIS — E66813 Obesity, class 3: Secondary | ICD-10-CM | POA: Diagnosis present

## 2023-04-11 DIAGNOSIS — D631 Anemia in chronic kidney disease: Secondary | ICD-10-CM | POA: Diagnosis present

## 2023-04-11 DIAGNOSIS — N1832 Chronic kidney disease, stage 3b: Secondary | ICD-10-CM | POA: Diagnosis present

## 2023-04-11 DIAGNOSIS — Z87820 Personal history of traumatic brain injury: Secondary | ICD-10-CM

## 2023-04-11 DIAGNOSIS — Z7901 Long term (current) use of anticoagulants: Secondary | ICD-10-CM

## 2023-04-11 DIAGNOSIS — E1122 Type 2 diabetes mellitus with diabetic chronic kidney disease: Secondary | ICD-10-CM | POA: Diagnosis present

## 2023-04-11 DIAGNOSIS — Z9089 Acquired absence of other organs: Secondary | ICD-10-CM

## 2023-04-11 DIAGNOSIS — N319 Neuromuscular dysfunction of bladder, unspecified: Secondary | ICD-10-CM | POA: Diagnosis present

## 2023-04-11 DIAGNOSIS — Z9889 Other specified postprocedural states: Secondary | ICD-10-CM

## 2023-04-11 DIAGNOSIS — N179 Acute kidney failure, unspecified: Secondary | ICD-10-CM | POA: Diagnosis present

## 2023-04-11 DIAGNOSIS — Z87440 Personal history of urinary (tract) infections: Secondary | ICD-10-CM

## 2023-04-11 DIAGNOSIS — Z8249 Family history of ischemic heart disease and other diseases of the circulatory system: Secondary | ICD-10-CM

## 2023-04-11 DIAGNOSIS — E871 Hypo-osmolality and hyponatremia: Secondary | ICD-10-CM | POA: Diagnosis present

## 2023-04-11 DIAGNOSIS — A4181 Sepsis due to Enterococcus: Secondary | ICD-10-CM | POA: Diagnosis present

## 2023-04-11 DIAGNOSIS — Z818 Family history of other mental and behavioral disorders: Secondary | ICD-10-CM

## 2023-04-11 DIAGNOSIS — Z6841 Body Mass Index (BMI) 40.0 and over, adult: Secondary | ICD-10-CM

## 2023-04-11 DIAGNOSIS — I5032 Chronic diastolic (congestive) heart failure: Secondary | ICD-10-CM | POA: Diagnosis present

## 2023-04-11 DIAGNOSIS — L89623 Pressure ulcer of left heel, stage 3: Secondary | ICD-10-CM | POA: Diagnosis present

## 2023-04-11 DIAGNOSIS — F0283 Dementia in other diseases classified elsewhere, unspecified severity, with mood disturbance: Secondary | ICD-10-CM | POA: Diagnosis present

## 2023-04-11 DIAGNOSIS — F319 Bipolar disorder, unspecified: Secondary | ICD-10-CM | POA: Diagnosis present

## 2023-04-11 DIAGNOSIS — Y846 Urinary catheterization as the cause of abnormal reaction of the patient, or of later complication, without mention of misadventure at the time of the procedure: Secondary | ICD-10-CM | POA: Diagnosis present

## 2023-04-11 DIAGNOSIS — L89154 Pressure ulcer of sacral region, stage 4: Secondary | ICD-10-CM | POA: Diagnosis present

## 2023-04-11 DIAGNOSIS — I130 Hypertensive heart and chronic kidney disease with heart failure and stage 1 through stage 4 chronic kidney disease, or unspecified chronic kidney disease: Secondary | ICD-10-CM | POA: Diagnosis present

## 2023-04-11 DIAGNOSIS — Z8619 Personal history of other infectious and parasitic diseases: Secondary | ICD-10-CM

## 2023-04-11 DIAGNOSIS — L8990 Pressure ulcer of unspecified site, unspecified stage: Secondary | ICD-10-CM | POA: Insufficient documentation

## 2023-04-11 DIAGNOSIS — G3109 Other frontotemporal dementia: Secondary | ICD-10-CM | POA: Diagnosis present

## 2023-04-11 DIAGNOSIS — L249 Irritant contact dermatitis, unspecified cause: Secondary | ICD-10-CM | POA: Diagnosis present

## 2023-04-11 DIAGNOSIS — Y738 Miscellaneous gastroenterology and urology devices associated with adverse incidents, not elsewhere classified: Secondary | ICD-10-CM | POA: Diagnosis present

## 2023-04-11 DIAGNOSIS — A419 Sepsis, unspecified organism: Secondary | ICD-10-CM | POA: Diagnosis present

## 2023-04-11 LAB — CBC WITH DIFFERENTIAL/PLATELET
Abs Immature Granulocytes: 0.04 10*3/uL (ref 0.00–0.07)
Basophils Absolute: 0 10*3/uL (ref 0.0–0.1)
Basophils Relative: 0 %
Eosinophils Absolute: 0.1 10*3/uL (ref 0.0–0.5)
Eosinophils Relative: 2 %
HCT: 31.8 % — ABNORMAL LOW (ref 39.0–52.0)
Hemoglobin: 9.9 g/dL — ABNORMAL LOW (ref 13.0–17.0)
Immature Granulocytes: 1 %
Lymphocytes Relative: 11 %
Lymphs Abs: 0.9 10*3/uL (ref 0.7–4.0)
MCH: 27.3 pg (ref 26.0–34.0)
MCHC: 31.1 g/dL (ref 30.0–36.0)
MCV: 87.8 fL (ref 80.0–100.0)
Monocytes Absolute: 0.7 10*3/uL (ref 0.1–1.0)
Monocytes Relative: 8 %
Neutro Abs: 6.5 10*3/uL (ref 1.7–7.7)
Neutrophils Relative %: 78 %
Platelets: 230 10*3/uL (ref 150–400)
RBC: 3.62 MIL/uL — ABNORMAL LOW (ref 4.22–5.81)
RDW: 15.8 % — ABNORMAL HIGH (ref 11.5–15.5)
WBC: 8.2 10*3/uL (ref 4.0–10.5)
nRBC: 0 % (ref 0.0–0.2)

## 2023-04-11 LAB — COMPREHENSIVE METABOLIC PANEL
ALT: 22 U/L (ref 0–44)
AST: 29 U/L (ref 15–41)
Albumin: 3.6 g/dL (ref 3.5–5.0)
Alkaline Phosphatase: 93 U/L (ref 38–126)
Anion gap: 13 (ref 5–15)
BUN: 44 mg/dL — ABNORMAL HIGH (ref 8–23)
CO2: 19 mmol/L — ABNORMAL LOW (ref 22–32)
Calcium: 8.8 mg/dL — ABNORMAL LOW (ref 8.9–10.3)
Chloride: 99 mmol/L (ref 98–111)
Creatinine, Ser: 2.64 mg/dL — ABNORMAL HIGH (ref 0.61–1.24)
GFR, Estimated: 26 mL/min — ABNORMAL LOW (ref >60)
Glucose, Bld: 194 mg/dL — ABNORMAL HIGH (ref 70–99)
Potassium: 5 mmol/L (ref 3.5–5.1)
Sodium: 131 mmol/L — ABNORMAL LOW (ref 135–145)
Total Bilirubin: 1 mg/dL (ref 0.3–1.2)
Total Protein: 9.5 g/dL — ABNORMAL HIGH (ref 6.5–8.1)

## 2023-04-11 LAB — PROCALCITONIN: Procalcitonin: 0.3 ng/mL

## 2023-04-11 LAB — PROTIME-INR
INR: 1.5 — ABNORMAL HIGH (ref 0.8–1.2)
Prothrombin Time: 18.4 s — ABNORMAL HIGH (ref 11.4–15.2)

## 2023-04-11 LAB — LACTIC ACID, PLASMA: Lactic Acid, Venous: 1.8 mmol/L (ref 0.5–1.9)

## 2023-04-11 MED ORDER — VANCOMYCIN HCL IN DEXTROSE 1-5 GM/200ML-% IV SOLN: 1000.0000 mg | Freq: Once | INTRAVENOUS | Status: DC

## 2023-04-11 MED ORDER — VANCOMYCIN HCL IN DEXTROSE 1-5 GM/200ML-% IV SOLN
1000.0000 mg | Freq: Once | INTRAVENOUS | Status: AC
  Administered 2023-04-12: 1000 mg via INTRAVENOUS
  Filled 2023-04-11: qty 200

## 2023-04-11 MED ORDER — VANCOMYCIN HCL 1500 MG/300ML IV SOLN
1500.0000 mg | Freq: Once | INTRAVENOUS | Status: AC
  Administered 2023-04-12: 1500 mg via INTRAVENOUS
  Filled 2023-04-11: qty 300

## 2023-04-11 MED ORDER — LACTATED RINGERS IV BOLUS
1000.0000 mL | Freq: Once | INTRAVENOUS | Status: AC
  Administered 2023-04-12: 1000 mL via INTRAVENOUS

## 2023-04-11 MED ORDER — METRONIDAZOLE 500 MG/100ML IV SOLN
500.0000 mg | Freq: Once | INTRAVENOUS | Status: AC
  Administered 2023-04-12: 500 mg via INTRAVENOUS
  Filled 2023-04-11: qty 100

## 2023-04-11 MED ORDER — SODIUM CHLORIDE 0.9 % IV SOLN
2.0000 g | Freq: Once | INTRAVENOUS | Status: AC
  Administered 2023-04-12: 2 g via INTRAVENOUS
  Filled 2023-04-11: qty 12.5

## 2023-04-11 NOTE — ED Triage Notes (Addendum)
Pt to ed from home via [facility] for weakness and fever of 102. Pt is caox4, in no acute distress and in wheel chair in triage. Pt has large wound on his bottom that he has not seen MD for as of yet.   Vitals:  94 HR 32 ETCO2 97% RA 144/70 BP  Pt is unable to tell me what day it is but can tell me the month and where he is. Pt advised he went to the MD this week everything was normal. Pt did not tell the MD about his wound on his bottom. Pt also has an indwelling foley in place. He advised it has been there for years. Urine looks clear and yellow.

## 2023-04-11 NOTE — ED Provider Notes (Signed)
[facility] Provider Note    Event Date/Time   First MD Initiated Contact with Patient 04/11/23 2305     (approximate)   History   Wound Infection   HPI  [name] is a 67 y.o. male with history of hypertension, diabetes, chronic kidney disease, CHF, traumatic brain injury, dementia, neurogenic bladder with chronic indwelling Foley catheter who presents to the emergency department with fevers, weakness that started today.  Patient is a poor historian.  No family at bedside.  He denies any pain.  He has a chronic indwelling Foley catheter.  States he thinks it was exchanged about a month ago.  Has been admitted many times before for sepsis.   History provided by patient.    Past Medical History:  Diagnosis Date   Acute exacerbation of CHF (congestive heart failure) (HCC)    Anemia    Bipolar disorder (HCC)    Bladder mass    Cellulitis    Chronic anticoagulation    Chronic indwelling Foley catheter    CKD (chronic kidney disease) stage 3, GFR 30-59 ml/min (HCC)    Colostomy in place (HCC)    Diabetes mellitus without complication (HCC)    Frontotemporal dementia (HCC)    Heel ulcer (HCC)    Left   Hematuria    History of blood clots    Hydronephrosis of right kidney    Hyperkalemia    Hypertension    Hyponatremia    Lithium toxicity    Metabolic acidosis    Neurogenic bladder    Obesity    Sacral decubitus ulcer    Sepsis (HCC)    TBI (traumatic brain injury) (HCC)    UTI (urinary tract infection)    VRE (vancomycin resistant enterococcus) culture positive     Past Surgical History:  Procedure Laterality Date   BACK SURGERY     CARPAL TUNNEL RELEASE Bilateral    COLON SURGERY     COLOSTOMY     CYSTOSCOPY W/ RETROGRADES Right 10/25/2022   Procedure: CYSTOSCOPY WITH RETROGRADE PYELOGRAM;  Surgeon: Sondra Come, MD;  Location: [facility] ORS;  Service: Urology;  Laterality: Right;   IR PERC TUN PERIT CATH WO PORT S&I /IMAG   10/11/2021   IR REMOVAL TUN CV CATH W/O FL  11/19/2021   IR US GUIDE VASC ACCESS RIGHT  10/11/2021   SACRAL DECUBITUS ULCER EXCISION     TONSILLECTOMY      MEDICATIONS:  Prior to Admission medications   Medication Sig Start Date End Date Taking? Authorizing Provider  amLODipine-olmesartan (AZOR) 5-20 MG tablet Take 1 tablet by mouth daily.    [provider]  cholecalciferol (VITAMIN D) 25 MCG tablet Take 1 tablet (1,000 Units total) by mouth daily. 07/31/21   Enedina Finner, MD  Continuous Blood Gluc Receiver (DEXCOM G7 RECEIVER) DEVI UAD for blood sugar monitoring. 01/23/22   [provider]  ELIQUIS 5 MG TABS tablet Take 5 mg by mouth 2 (two) times daily.  11/17/18   [provider]  fish oil-omega-3 fatty acids 1000 MG capsule Take 1 g by mouth 3 (three) times a week. 10/12/21   [provider]  furosemide (LASIX) 40 MG tablet Take 1 tablet (40 mg total) by mouth daily. 03/05/23   Marcelino Duster, MD  glipiZIDE (GLUCOTROL XL) 2.5 MG 24 hr tablet Take 2.5 mg by mouth daily. 02/04/23   [provider]  memantine (NAMENDA) 10 MG tablet Take 10 mg by mouth 2 (two) times daily.  [provider]  Oxcarbazepine (TRILEPTAL) 300 MG tablet Take 1 tablet (300 mg total) by mouth 2 (two) times daily. 12/04/22   [name], MD  QUEtiapine (SEROQUEL) 25 MG tablet TAKE 1 TO 2 TABLETS BY MOUTH ONCE DAILY AS NEEDED 02/23/23   [name], MD  rosuvastatin (CRESTOR) 20 MG tablet Take 20 mg by mouth at bedtime. 08/29/17   [provider]  sodium bicarbonate 650 MG tablet Take 1 tablet (650 mg total) by mouth daily. 03/05/23   Marcelino Duster, MD  vitamin B-12 (CYANOCOBALAMIN) 1000 MCG tablet Take 3,000 mcg by mouth daily.    [provider]    Physical Exam   Triage Vital Signs: ED Triage Vitals [04/11/23 2204]  Encounter Vitals Group     BP 112/74     Systolic BP Percentile      Diastolic BP Percentile      Pulse Rate (!)  115     Resp (!) 22     Temp 98.4 F (36.9 C)     Temp Source Oral     SpO2 100 %     Weight 300 lb (136.1 kg)     Height 5\' 6"  (1.676 m)     Head Circumference      Peak Flow      Pain Score 0     Pain Loc      Pain Education      Exclude from Growth Chart     Most recent vital signs: Vitals:   04/11/23 2241 04/12/23 0005  BP: 126/62 120/61  Pulse: 92 86  Resp: 20 20  Temp: 98.1 F (36.7 C) (!) 102.9 F (39.4 C)  SpO2: 99% 98%    CONSTITUTIONAL: Alert, responds appropriately to questions.  Chronically ill-appearing HEAD: Normocephalic, atraumatic EYES: Conjunctivae clear, pupils appear equal, sclera nonicteric ENT: normal nose; moist mucous membranes NECK: Supple, normal ROM CARD: Regular and tachycardic; S1 and S2 appreciated RESP: Normal chest excursion without splinting or tachypnea; breath sounds clear and equal bilaterally; no wheezes, no rhonchi, no rales, no hypoxia or respiratory distress, speaking full sentences ABD/GI: Non-distended; soft, non-tender, no rebound, no guarding, no peritoneal signs, ostomy in the left lower quadrant with brown appearing stool, chronic indwelling Foley catheter in place BACK: The back appears normal, stage IV sacral decubitus ulcer noted to the buttocks without surrounding redness, warmth or purulent drainage EXT: Normal ROM in all joints; no deformity noted, no edema SKIN: Normal color for age and race; warm; no rash on exposed skin NEURO: Moves all extremities equally, normal speech PSYCH: The patient's mood and manner are appropriate.   ED Results / Procedures / Treatments   LABS: (all labs ordered are listed, but only abnormal results are displayed) Labs Reviewed  COMPREHENSIVE METABOLIC PANEL - Abnormal; Notable for the following components:      Result Value   Sodium 131 (*)    CO2 19 (*)    Glucose, Bld 194 (*)    BUN 44 (*)    Creatinine, Ser 2.64 (*)    Calcium 8.8 (*)    Total Protein 9.5 (*)    GFR, Estimated  26 (*)    All other components within normal limits  CBC WITH DIFFERENTIAL/PLATELET - Abnormal; Notable for the following components:   RBC 3.62 (*)    Hemoglobin 9.9 (*)    HCT 31.8 (*)    RDW 15.8 (*)    All other components within normal limits  PROTIME-INR - Abnormal; Notable for  the following components:   Prothrombin Time 18.4 (*)    INR 1.5 (*)    All other components within normal limits  URINALYSIS, W/ REFLEX TO CULTURE (INFECTION SUSPECTED) - Abnormal; Notable for the following components:   Color, Urine YELLOW (*)    APPearance HAZY (*)    Hgb urine dipstick MODERATE (*)    Protein, ur 100 (*)    Leukocytes,Ua LARGE (*)    Bacteria, UA RARE (*)    All other components within normal limits  CULTURE, BLOOD (ROUTINE X 2)  CULTURE, BLOOD (ROUTINE X 2)  LACTIC ACID, PLASMA  PROCALCITONIN     EKG:  EKG Interpretation Date/Time:  Friday April 11 2023 22:26:46 EDT Ventricular Rate:  107 PR Interval:  160 QRS Duration:  78 QT Interval:  336 QTC Calculation: 448 R Axis:   45  Text Interpretation: Sinus tachycardia Otherwise normal ECG When compared with ECG of 26-Feb-2023 23:40, PREVIOUS ECG IS PRESENT Confirmed by Rochele Raring [id number]) on 04/11/2023 11:37:10 PM         RADIOLOGY: My personal review and interpretation of imaging: Chest x-ray clear.  I have personally reviewed all radiology reports.   DG Chest Port 1 View  Result Date: 04/12/2023 CLINICAL DATA:  Weakness, fever EXAM: PORTABLE CHEST 1 VIEW COMPARISON:  02/26/2023 FINDINGS: The heart size and mediastinal contours are within normal limits. Both lungs are clear. The visualized skeletal structures are unremarkable. IMPRESSION: No active disease. Electronically Signed   By: Charlett Nose M.D.   On: 04/12/2023 00:24     PROCEDURES:  Critical Care performed: Yes, see critical care procedure note(s)   CRITICAL CARE Performed by: Baxter Hire Pedro Oldenburg   Total critical care time: 35 minutes  Critical  care time was exclusive of separately billable procedures and treating other patients.  Critical care was necessary to treat or prevent imminent or life-threatening deterioration.  Critical care was time spent personally by me on the following activities: development of treatment plan with patient and/or surrogate as well as nursing, discussions with consultants, evaluation of patient's response to treatment, examination of patient, obtaining history from patient or surrogate, ordering and performing treatments and interventions, ordering and review of laboratory studies, ordering and review of radiographic studies, pulse oximetry and re-evaluation of patient's condition.   Marland Kitchen1-3 Lead EKG Interpretation  Performed by: Emmylou Bieker, Layla Maw, DO Authorized by: Cletis Clack, Layla Maw, DO     Interpretation: abnormal     ECG rate:  115   ECG rate assessment: tachycardic     Rhythm: sinus tachycardia     Ectopy: none     Conduction: normal       IMPRESSION / MDM / ASSESSMENT AND PLAN / ED COURSE  I reviewed the triage vital signs and the nursing notes.    Patient here with recurrent sepsis.  Reports fevers of 102 at home and feeling weak.  The patient is on the cardiac monitor to evaluate for evidence of arrhythmia and/or significant heart rate changes.   DIFFERENTIAL DIAGNOSIS (includes but not limited to):   Sepsis, UTI, bacteremia, anemia, electrolyte derangement, dehydration   Patient's presentation is most consistent with acute presentation with potential threat to life or bodily function.   PLAN: Will obtain cultures, urine, labs, chest x-ray.  Will give IV fluids.  Patient is tachycardic, tachypneic.  Will start broad-spectrum antibiotics.  Will obtain rectal temperature.   MEDICATIONS GIVEN IN ED: Medications  ceFEPIme (MAXIPIME) 2 g in sodium chloride 0.9 % 100 mL IVPB (2  g Intravenous New Bag/Given 04/12/23 0037)  metroNIDAZOLE (FLAGYL) IVPB 500 mg (500 mg Intravenous New Bag/Given  04/12/23 0041)  vancomycin (VANCOCIN) IVPB 1000 mg/200 mL premix (has no administration in time range)    Followed by  vancomycin (VANCOREADY) IVPB 1500 mg/300 mL (has no administration in time range)  acetaminophen (TYLENOL) tablet 1,000 mg (has no administration in time range)  lactated ringers bolus 1,000 mL (1,000 mLs Intravenous New Bag/Given 04/12/23 0035)     ED COURSE: Rectal temperature 102.9.  Will give Tylenol.  Urine appears grossly infected.  Culture pending.  Vital signs improving.  Lactic normal.  Procalcitonin 0.3.  Patient has a mild AKI.  Getting IV fluids.  Will admit for urosepsis.  CONSULTS:  Consulted and discussed patient's case with hospitalist, Dr. Para March.  I have recommended admission and consulting physician agrees and will place admission orders.  Patient (and family if present) agree with this plan.   I reviewed all nursing notes, vitals, pertinent previous records.  All labs, EKGs, imaging ordered have been independently reviewed and interpreted by myself.    OUTSIDE RECORDS REVIEWED: Reviewed last admission at the end of August 2024 for sepsis from UTI.       FINAL CLINICAL IMPRESSION(S) / ED DIAGNOSES   Final diagnoses:  AKI (acute kidney injury) (HCC)  Acute UTI     Rx / DC Orders   ED Discharge Orders     None        Note:  This document was prepared using Dragon voice recognition software and may include unintentional dictation errors.   Sammi Stolarz, Layla Maw, DO 04/12/23 [phone]

## 2023-04-12 DIAGNOSIS — F319 Bipolar disorder, unspecified: Secondary | ICD-10-CM | POA: Diagnosis present

## 2023-04-12 DIAGNOSIS — L249 Irritant contact dermatitis, unspecified cause: Secondary | ICD-10-CM | POA: Diagnosis present

## 2023-04-12 DIAGNOSIS — Z6841 Body Mass Index (BMI) 40.0 and over, adult: Secondary | ICD-10-CM | POA: Diagnosis not present

## 2023-04-12 DIAGNOSIS — L89623 Pressure ulcer of left heel, stage 3: Secondary | ICD-10-CM | POA: Diagnosis present

## 2023-04-12 DIAGNOSIS — N1832 Chronic kidney disease, stage 3b: Secondary | ICD-10-CM | POA: Diagnosis present

## 2023-04-12 DIAGNOSIS — E871 Hypo-osmolality and hyponatremia: Secondary | ICD-10-CM | POA: Diagnosis present

## 2023-04-12 DIAGNOSIS — Y846 Urinary catheterization as the cause of abnormal reaction of the patient, or of later complication, without mention of misadventure at the time of the procedure: Secondary | ICD-10-CM | POA: Diagnosis present

## 2023-04-12 DIAGNOSIS — A498 Other bacterial infections of unspecified site: Secondary | ICD-10-CM | POA: Diagnosis not present

## 2023-04-12 DIAGNOSIS — Z7984 Long term (current) use of oral hypoglycemic drugs: Secondary | ICD-10-CM | POA: Diagnosis not present

## 2023-04-12 DIAGNOSIS — D631 Anemia in chronic kidney disease: Secondary | ICD-10-CM | POA: Diagnosis present

## 2023-04-12 DIAGNOSIS — Y738 Miscellaneous gastroenterology and urology devices associated with adverse incidents, not elsewhere classified: Secondary | ICD-10-CM | POA: Diagnosis present

## 2023-04-12 DIAGNOSIS — E1122 Type 2 diabetes mellitus with diabetic chronic kidney disease: Secondary | ICD-10-CM | POA: Diagnosis present

## 2023-04-12 DIAGNOSIS — A4181 Sepsis due to Enterococcus: Secondary | ICD-10-CM | POA: Diagnosis present

## 2023-04-12 DIAGNOSIS — N390 Urinary tract infection, site not specified: Secondary | ICD-10-CM | POA: Diagnosis present

## 2023-04-12 DIAGNOSIS — G3109 Other frontotemporal dementia: Secondary | ICD-10-CM | POA: Diagnosis present

## 2023-04-12 DIAGNOSIS — N319 Neuromuscular dysfunction of bladder, unspecified: Secondary | ICD-10-CM | POA: Diagnosis present

## 2023-04-12 DIAGNOSIS — L89154 Pressure ulcer of sacral region, stage 4: Secondary | ICD-10-CM | POA: Diagnosis present

## 2023-04-12 DIAGNOSIS — E66813 Obesity, class 3: Secondary | ICD-10-CM | POA: Diagnosis present

## 2023-04-12 DIAGNOSIS — T83518A Infection and inflammatory reaction due to other urinary catheter, initial encounter: Secondary | ICD-10-CM | POA: Diagnosis present

## 2023-04-12 DIAGNOSIS — I130 Hypertensive heart and chronic kidney disease with heart failure and stage 1 through stage 4 chronic kidney disease, or unspecified chronic kidney disease: Secondary | ICD-10-CM | POA: Diagnosis present

## 2023-04-12 DIAGNOSIS — A419 Sepsis, unspecified organism: Secondary | ICD-10-CM | POA: Diagnosis not present

## 2023-04-12 DIAGNOSIS — I5032 Chronic diastolic (congestive) heart failure: Secondary | ICD-10-CM | POA: Diagnosis present

## 2023-04-12 DIAGNOSIS — F0283 Dementia in other diseases classified elsewhere, unspecified severity, with mood disturbance: Secondary | ICD-10-CM | POA: Diagnosis present

## 2023-04-12 DIAGNOSIS — L89613 Pressure ulcer of right heel, stage 3: Secondary | ICD-10-CM | POA: Diagnosis present

## 2023-04-12 DIAGNOSIS — N179 Acute kidney failure, unspecified: Secondary | ICD-10-CM | POA: Diagnosis present

## 2023-04-12 DIAGNOSIS — Z933 Colostomy status: Secondary | ICD-10-CM | POA: Diagnosis not present

## 2023-04-12 DIAGNOSIS — Z7901 Long term (current) use of anticoagulants: Secondary | ICD-10-CM | POA: Diagnosis not present

## 2023-04-12 DIAGNOSIS — Z433 Encounter for attention to colostomy: Secondary | ICD-10-CM | POA: Diagnosis not present

## 2023-04-12 LAB — URINALYSIS, W/ REFLEX TO CULTURE (INFECTION SUSPECTED)
Bilirubin Urine: NEGATIVE
Glucose, UA: NEGATIVE mg/dL
Ketones, ur: NEGATIVE mg/dL
Nitrite: NEGATIVE
Protein, ur: 100 mg/dL — AB
Specific Gravity, Urine: 1.012 (ref 1.005–1.030)
WBC, UA: >50 WBC/hpf (ref 0–5)
pH: 5 (ref 5.0–8.0)

## 2023-04-12 LAB — COMPREHENSIVE METABOLIC PANEL
ALT: 16 U/L (ref 0–44)
AST: 20 U/L (ref 15–41)
Albumin: 2.9 g/dL — ABNORMAL LOW (ref 3.5–5.0)
Alkaline Phosphatase: 77 U/L (ref 38–126)
Anion gap: 6 (ref 5–15)
BUN: 43 mg/dL — ABNORMAL HIGH (ref 8–23)
CO2: 22 mmol/L (ref 22–32)
Calcium: 8.1 mg/dL — ABNORMAL LOW (ref 8.9–10.3)
Chloride: 102 mmol/L (ref 98–111)
Creatinine, Ser: 2.5 mg/dL — ABNORMAL HIGH (ref 0.61–1.24)
GFR, Estimated: 27 mL/min — ABNORMAL LOW (ref >60)
Glucose, Bld: 194 mg/dL — ABNORMAL HIGH (ref 70–99)
Potassium: 4.3 mmol/L (ref 3.5–5.1)
Sodium: 130 mmol/L — ABNORMAL LOW (ref 135–145)
Total Bilirubin: 0.7 mg/dL (ref 0.3–1.2)
Total Protein: 7.8 g/dL (ref 6.5–8.1)

## 2023-04-12 LAB — HIV ANTIBODY (ROUTINE TESTING W REFLEX): HIV Screen 4th Generation wRfx: NONREACTIVE

## 2023-04-12 MED ORDER — ONDANSETRON HCL 4 MG/2ML IJ SOLN: 4.0000 mg | Freq: Four times a day (QID) | INTRAMUSCULAR | Status: DC | PRN

## 2023-04-12 MED ORDER — OXCARBAZEPINE 300 MG PO TABS
300.0000 mg | ORAL_TABLET | Freq: Two times a day (BID) | ORAL | Status: DC
  Administered 2023-04-12 – 2023-04-19 (×15): 300 mg via ORAL
  Filled 2023-04-12 (×17): qty 1

## 2023-04-12 MED ORDER — SODIUM BICARBONATE 650 MG PO TABS
650.0000 mg | ORAL_TABLET | Freq: Every day | ORAL | Status: DC
  Administered 2023-04-12 – 2023-04-19 (×8): 650 mg via ORAL
  Filled 2023-04-12 (×8): qty 1

## 2023-04-12 MED ORDER — SODIUM CHLORIDE 0.9 % IV SOLN
2.0000 g | Freq: Two times a day (BID) | INTRAVENOUS | Status: DC
  Administered 2023-04-12 – 2023-04-14 (×5): 2 g via INTRAVENOUS
  Filled 2023-04-12 (×5): qty 12.5

## 2023-04-12 MED ORDER — AMLODIPINE BESYLATE 5 MG PO TABS
5.0000 mg | ORAL_TABLET | Freq: Every day | ORAL | Status: DC
  Administered 2023-04-12 – 2023-04-19 (×8): 5 mg via ORAL
  Filled 2023-04-12 (×8): qty 1

## 2023-04-12 MED ORDER — ACETAMINOPHEN 650 MG RE SUPP: 650.0000 mg | Freq: Four times a day (QID) | RECTAL | Status: DC | PRN

## 2023-04-12 MED ORDER — QUETIAPINE FUMARATE 25 MG PO TABS
25.0000 mg | ORAL_TABLET | Freq: Every day | ORAL | Status: DC
  Administered 2023-04-12 – 2023-04-18 (×7): 25 mg via ORAL
  Filled 2023-04-12 (×7): qty 1

## 2023-04-12 MED ORDER — ROSUVASTATIN CALCIUM 10 MG PO TABS
20.0000 mg | ORAL_TABLET | Freq: Every day | ORAL | Status: DC
  Administered 2023-04-12 – 2023-04-19 (×8): 20 mg via ORAL
  Filled 2023-04-12 (×3): qty 2
  Filled 2023-04-12: qty 1
  Filled 2023-04-12 (×4): qty 2

## 2023-04-12 MED ORDER — MEMANTINE HCL 5 MG PO TABS
10.0000 mg | ORAL_TABLET | Freq: Two times a day (BID) | ORAL | Status: DC
  Administered 2023-04-12 – 2023-04-19 (×15): 10 mg via ORAL
  Filled 2023-04-12 (×15): qty 2

## 2023-04-12 MED ORDER — CHLORHEXIDINE GLUCONATE CLOTH 2 % EX PADS
6.0000 | MEDICATED_PAD | Freq: Every day | CUTANEOUS | Status: DC
  Administered 2023-04-12 – 2023-04-18 (×7): 6 via TOPICAL

## 2023-04-12 MED ORDER — IRBESARTAN 150 MG PO TABS
150.0000 mg | ORAL_TABLET | Freq: Every day | ORAL | Status: DC
  Administered 2023-04-12 – 2023-04-19 (×8): 150 mg via ORAL
  Filled 2023-04-12 (×8): qty 1

## 2023-04-12 MED ORDER — HYDROCODONE-ACETAMINOPHEN 5-325 MG PO TABS: 1.0000 | ORAL_TABLET | ORAL | Status: DC | PRN

## 2023-04-12 MED ORDER — ACETAMINOPHEN 325 MG PO TABS: 650.0000 mg | ORAL_TABLET | Freq: Four times a day (QID) | ORAL | Status: DC | PRN

## 2023-04-12 MED ORDER — ACETAMINOPHEN 500 MG PO TABS
1000.0000 mg | ORAL_TABLET | Freq: Once | ORAL | Status: AC
  Administered 2023-04-12: 1000 mg via ORAL
  Filled 2023-04-12: qty 2

## 2023-04-12 MED ORDER — LACTATED RINGERS IV SOLN
150.0000 mL/h | INTRAVENOUS | Status: DC
  Administered 2023-04-12: 150 mL/h via INTRAVENOUS

## 2023-04-12 MED ORDER — AMLODIPINE-OLMESARTAN 5-20 MG PO TABS: 1.0000 | ORAL_TABLET | Freq: Every day | ORAL | Status: DC

## 2023-04-12 MED ORDER — APIXABAN 5 MG PO TABS
5.0000 mg | ORAL_TABLET | Freq: Two times a day (BID) | ORAL | Status: DC
  Administered 2023-04-12 – 2023-04-19 (×15): 5 mg via ORAL
  Filled 2023-04-12 (×15): qty 1

## 2023-04-12 MED ORDER — ONDANSETRON HCL 4 MG PO TABS: 4.0000 mg | ORAL_TABLET | Freq: Four times a day (QID) | ORAL | Status: DC | PRN

## 2023-04-12 NOTE — ED Notes (Signed)
Per attending MD, let current IV bag infuse all the way before turning off. Also, UA to be obtained after replacing foley catheter. Pt currently sleeping, will assess & do overdue temp & other tasks once awake.

## 2023-04-12 NOTE — Progress Notes (Signed)
Pt being followed by [facility] for Sepsis protocol. 

## 2023-04-12 NOTE — Progress Notes (Signed)
  INTERVAL PROGRESS NOTE    [name]- 67 y.o. male  LOS: 0 __________________________________________________________________  SUBJECTIVE: Admitted 04/11/2023 with cc of  Chief Complaint  Patient presents with   Wound Infection   Since admission, remains stable ORA  OBJECTIVE: Blood pressure 131/78, pulse 78, temperature 99.6 F (37.6 C), temperature source Oral, resp. rate 19, height 5\' 6"  (1.676 m), weight 136.1 kg, SpO2 98%.   ASSESSMENT/PLAN:  I have reviewed the full H&P by Dr. Para March, and I agree with the assessment and plan as outlined therein.  In addition: Sepsis secondary to catheter associated UTI (HCC) History of Klebsiella UTI with bacteremia 02/26/2023 Chronic Foley catheter Had catheter removed and replaced with urine culture collected.  LA has normalized. Procalcitonin 0.3   AKI superimposed on stage 3b chronic kidney disease (HCC) Mild improvement. Cr 2.64>2.5    Hyponatremia- Na+ 131>130   Principal Problem:   Sepsis secondary to catheter associated UTI [facility]) Active Problems:   Acute renal failure superimposed on stage 3b chronic kidney disease (HCC)   [name] Bock, DO [facility] 04/12/2023, 8:17 AM    [email or web address] Available by Epic secure chat 7AM-7PM. If 7PM-7AM, please contact night-coverage   No Charge

## 2023-04-12 NOTE — Consult Note (Signed)
WOC Nurse ostomy consult note Patient known to [facility] nursing team.  Stoma type/location: LLQ colostomy since 2021 Ostomy pouching: 2pc. 2 3/4" pouch with barrier ring Hart [place] # 2 (2 3/4" skin barrier), Lawson # [phone] (2 3/4" pouch), Hart [place] # [phone] (2" barrier ring)  Orders updated for ostomy supplies  We are not consulted for wound care however patient has chronic non healing sacral pressure injury and has been followed in the [facility]. Last seen inpatient by our team 02/22/23.  Patient is followed in the [facility] for chronic sacral wound and heels wound last seen 04/01/23 per Dr. Larina Bras (70 weeks).  Chronic wounds:  Right heel: Stage 3 Pressure Injury Left heel: Stage 3 Pressure Injury Sacrum: Stage 4 Pressure Injury Right ischium;gluteal; non pressure injury; irritant contact dermatitis    Using hydrofera blue; bateriostatic foam being used.    Will substitute similar products for use while inpatient.  Cleanse wounds with saline, pat dry Cut to fit silver hydrofiber (Aquacel Ag+) top with foam (heels), top sacral wound with ABD pads and secure with tape.  Change all wounds daily  Low air loss mattress for moisture management and pressure redistribution.  Prevalon boots bilaterally to offload heels at all times.   Re consult if needed, will not follow at this time. Thanks  Jamier Urbas [employer], RN,CWOCN, CNS, CWON-AP [phone])

## 2023-04-12 NOTE — Progress Notes (Signed)
PHARMACY -  BRIEF ANTIBIOTIC NOTE   Pharmacy has received consult(s) for Vanc, Cefepime from an ED provider.  The patient's profile has been reviewed for ht/wt/allergies/indication/available labs.    One time order(s) placed for Vancomycin 2500 mg IV x 1 and Cefepime 2 gm IV X 1   Further antibiotics/pharmacy consults should be ordered by admitting physician if indicated.                       Thank you, Clovis Warwick D 04/12/2023  1:04 AM

## 2023-04-12 NOTE — ED Notes (Signed)
Wiped face and eyes with wet washcloth. Provided new blankets, milk and ginger ale.

## 2023-04-12 NOTE — Assessment & Plan Note (Signed)
Secondary to sepsis Expecting improvement with IV fluid resuscitation Monitor renal function and avoid nephrotoxins

## 2023-04-12 NOTE — Progress Notes (Signed)
Pharmacy Antibiotic Note  [name] is a 67 y.o. male admitted on 04/11/2023 with sepsis.  Pharmacy has been consulted for Cefepime dosing.  Plan: Cefepime 2 gm IV Q12H ordered to start on 10/12 @ 0300.   Height: 5\' 6"  (167.6 cm) Weight: 136.1 kg (300 lb) IBW/kg (Calculated) : 63.8  Temp (24hrs), Avg:99.8 F (37.7 C), Min:98.1 F (36.7 C), Max:102.9 F (39.4 C)  Recent Labs  Lab 04/11/23 2206  WBC 8.2  CREATININE 2.64*  LATICACIDVEN 1.8    Estimated Creatinine Clearance: 35.6 mL/min (A) (by C-G formula based on SCr of 2.64 mg/dL (H)).    No Known Allergies  Antimicrobials this admission:   >>    >>   Dose adjustments this admission:   Microbiology results:  BCx:   UCx:    Sputum:   MRSA PCR:   Thank you for allowing pharmacy to be a part of this patient's care.  Arian Mcquitty D 04/12/2023 2:36 AM

## 2023-04-12 NOTE — Assessment & Plan Note (Addendum)
History of Klebsiella UTI with bacteremia 02/26/2023 Chronic Foley catheter Sepsis criteria include fever, tachycardia and tachypnea.  WBC 8.2 and lactic acid 1.8 and procalcitonin 0.3 Continue cefepime Sepsis fluids Follow cultures, urine and blood

## 2023-04-12 NOTE — ED Notes (Signed)
Colostomy bag was "burped". Pt states he does not know how to do this. Ginger ale provided per request.

## 2023-04-12 NOTE — H&P (Signed)
History and Physical    [name] GLO:[id number] DOB: 1955/09/04 DOA: 04/11/2023 DOS: the patient was seen and examined on 04/12/2023 PCP: Dione Booze, MD  Patient coming from: Home  Chief Complaint:  Chief Complaint  Patient presents with   Wound Infection    HPI: [name] is a 67 y.o. male with medical history significant for TBI, bipolar mood disorder, CKD lllb, DM, HTN, recurrent DVT on Eliquis, colostomy status, neurogenic bladder with chronic indwelling Foley catheter, stage IV sacral decubitus and chronic right calcaneus ulcer ,  multiple prior hospitalizations for sepsis (cellulitis, osteomyelitis and UTIs), most recently 8/28 to 03/05/2023 with Klebsiella bacteremia from UTI, who was brought to the ED for evaluation of fever and weakness starting on the day of arrival ED course and data review: Tmax 102.9, tachycardic to 115 on arrival and tachypneic to 22, O2 sats high 90s on room air. Labs: WBC normal at 8200 with normal lactic acid 1.8 and procalcitonin 0.3.Hemoglobin At baseline at 9.9. CMP notable for creatinine 2.64 up from baseline of 2 a month prior, bicarb 19.  Urinalysis with large leukocyte esterase and rare bacteria. COVID pending EKG, personally viewed and interpreted showing sinus tachycardia at 107 with no acute ST-T wave changes. Chest x-ray with no active disease. Patient started on cefepime for sepsis of suspected urinary source as well as IV fluids   Sepsis  Past Medical History:  Diagnosis Date   Acute exacerbation of CHF (congestive heart failure) (HCC)    Anemia    Bipolar disorder (HCC)    Bladder mass    Cellulitis    Chronic anticoagulation    Chronic indwelling Foley catheter    CKD (chronic kidney disease) stage 3, GFR 30-59 ml/min (HCC)    Colostomy in place (HCC)    Diabetes mellitus without complication (HCC)    Frontotemporal dementia (HCC)    Heel ulcer (HCC)    Left   Hematuria    History of  blood clots    Hydronephrosis of right kidney    Hyperkalemia    Hypertension    Hyponatremia    Lithium toxicity    Metabolic acidosis    Neurogenic bladder    Obesity    Sacral decubitus ulcer    Sepsis (HCC)    TBI (traumatic brain injury) (HCC)    UTI (urinary tract infection)    VRE (vancomycin resistant enterococcus) culture positive    Past Surgical History:  Procedure Laterality Date   BACK SURGERY     CARPAL TUNNEL RELEASE Bilateral    COLON SURGERY     COLOSTOMY     CYSTOSCOPY W/ RETROGRADES Right 10/25/2022   Procedure: CYSTOSCOPY WITH RETROGRADE PYELOGRAM;  Surgeon: Sondra Come, MD;  Location: [facility] ORS;  Service: Urology;  Laterality: Right;   IR PERC TUN PERIT CATH WO PORT S&I /IMAG  10/11/2021   IR REMOVAL TUN CV CATH W/O FL  11/19/2021   IR US GUIDE VASC ACCESS RIGHT  10/11/2021   SACRAL DECUBITUS ULCER EXCISION     TONSILLECTOMY     Social History:  reports that he has never smoked. He has never been exposed to tobacco smoke. He has never used smokeless tobacco. He reports that he does not currently use alcohol. He reports that he does not use drugs.  No Known Allergies  Family History  Problem Relation Age of Onset   Depression Father    Deep vein thrombosis Father     Prior to Admission medications  Medication Sig Start Date End Date Taking? Authorizing Provider  amLODipine-olmesartan (AZOR) 5-20 MG tablet Take 1 tablet by mouth daily.    [provider]  cholecalciferol (VITAMIN D) 25 MCG tablet Take 1 tablet (1,000 Units total) by mouth daily. 07/31/21   Enedina Finner, MD  Continuous Blood Gluc Receiver (DEXCOM G7 RECEIVER) DEVI UAD for blood sugar monitoring. 01/23/22   [provider]  ELIQUIS 5 MG TABS tablet Take 5 mg by mouth 2 (two) times daily.  11/17/18   [provider]  fish oil-omega-3 fatty acids 1000 MG capsule Take 1 g by mouth 3 (three) times a week. 10/12/21   [provider]  furosemide (LASIX) 40 MG  tablet Take 1 tablet (40 mg total) by mouth daily. 03/05/23   Marcelino Duster, MD  glipiZIDE (GLUCOTROL XL) 2.5 MG 24 hr tablet Take 2.5 mg by mouth daily. 02/04/23   [provider]  memantine (NAMENDA) 10 MG tablet Take 10 mg by mouth 2 (two) times daily.    [provider]  Oxcarbazepine (TRILEPTAL) 300 MG tablet Take 1 tablet (300 mg total) by mouth 2 (two) times daily. 12/04/22   [name], MD  QUEtiapine (SEROQUEL) 25 MG tablet TAKE 1 TO 2 TABLETS BY MOUTH ONCE DAILY AS NEEDED 02/23/23   [name], MD  rosuvastatin (CRESTOR) 20 MG tablet Take 20 mg by mouth at bedtime. 08/29/17   [provider]  sodium bicarbonate 650 MG tablet Take 1 tablet (650 mg total) by mouth daily. 03/05/23   Marcelino Duster, MD  vitamin B-12 (CYANOCOBALAMIN) 1000 MCG tablet Take 3,000 mcg by mouth daily.    [provider]    Physical Exam: Vitals:   04/11/23 2204 04/11/23 2241 04/12/23 0005  BP: 112/74 126/62 120/61  Pulse: (!) 115 92 86  Resp: (!) 22 20 20   Temp: 98.4 F (36.9 C) 98.1 F (36.7 C) (!) 102.9 F (39.4 C)  TempSrc: Oral Oral Rectal  SpO2: 100% 99% 98%  Weight: 136.1 kg    Height: 5\' 6"  (1.676 m)     Physical Exam Vitals and nursing note reviewed.  Constitutional:      General: He is not in acute distress. HENT:     Head: Normocephalic and atraumatic.  Cardiovascular:     Rate and Rhythm: Regular rhythm. Tachycardia present.     Heart sounds: Normal heart sounds.  Pulmonary:     Effort: Tachypnea present.     Breath sounds: Normal breath sounds.  Abdominal:     General: The ostomy site is clean.     Palpations: Abdomen is soft.     Tenderness: There is no abdominal tenderness.  Neurological:     Mental Status: Mental status is at baseline.     Labs on Admission: I have personally reviewed following labs and imaging studies  CBC: Recent Labs  Lab 04/11/23 2206  WBC 8.2  NEUTROABS 6.5  HGB 9.9*  HCT 31.8*  MCV 87.8   PLT 230   Basic Metabolic Panel: Recent Labs  Lab 04/11/23 2206  NA 131*  K 5.0  CL 99  CO2 19*  GLUCOSE 194*  BUN 44*  CREATININE 2.64*  CALCIUM 8.8*   GFR: Estimated Creatinine Clearance: 35.6 mL/min (A) (by C-G formula based on SCr of 2.64 mg/dL (H)). Liver Function Tests: Recent Labs  Lab 04/11/23 2206  AST 29  ALT 22  ALKPHOS 93  BILITOT 1.0  PROT 9.5*  ALBUMIN 3.6   No results for input(s): "  LIPASE", "AMYLASE" in the last 168 hours. No results for input(s): "AMMONIA" in the last 168 hours. Coagulation Profile: Recent Labs  Lab 04/11/23 2206  INR 1.5*   Cardiac Enzymes: No results for input(s): "CKTOTAL", "CKMB", "CKMBINDEX", "TROPONINI" in the last 168 hours. BNP (last 3 results) No results for input(s): "PROBNP" in the last 8760 hours. HbA1C: No results for input(s): "HGBA1C" in the last 72 hours. CBG: No results for input(s): "GLUCAP" in the last 168 hours. Lipid Profile: No results for input(s): "CHOL", "HDL", "LDLCALC", "TRIG", "CHOLHDL", "LDLDIRECT" in the last 72 hours. Thyroid Function Tests: No results for input(s): "TSH", "T4TOTAL", "FREET4", "T3FREE", "THYROIDAB" in the last 72 hours. Anemia Panel: No results for input(s): "VITAMINB12", "FOLATE", "FERRITIN", "TIBC", "IRON", "RETICCTPCT" in the last 72 hours. Urine analysis:    Component Value Date/Time   COLORURINE YELLOW (A) 04/12/2023 0021   APPEARANCEUR HAZY (A) 04/12/2023 0021   APPEARANCEUR Cloudy (A) 08/09/2021 1559   LABSPEC 1.012 04/12/2023 0021   PHURINE 5.0 04/12/2023 0021   GLUCOSEU NEGATIVE 04/12/2023 0021   HGBUR MODERATE (A) 04/12/2023 0021   BILIRUBINUR NEGATIVE 04/12/2023 0021   BILIRUBINUR Negative 08/09/2021 1559   KETONESUR NEGATIVE 04/12/2023 0021   PROTEINUR 100 (A) 04/12/2023 0021   NITRITE NEGATIVE 04/12/2023 0021   LEUKOCYTESUR LARGE (A) 04/12/2023 0021    Radiological Exams on Admission: DG Chest Port 1 View  Result Date: 04/12/2023 CLINICAL DATA:   Weakness, fever EXAM: PORTABLE CHEST 1 VIEW COMPARISON:  02/26/2023 FINDINGS: The heart size and mediastinal contours are within normal limits. Both lungs are clear. The visualized skeletal structures are unremarkable. IMPRESSION: No active disease. Electronically Signed   By: Charlett Nose M.D.   On: 04/12/2023 00:24     Data Reviewed: Relevant notes from primary care and specialist visits, past discharge summaries as available in EHR, including Care Everywhere. Prior diagnostic testing as pertinent to current admission diagnoses Updated medications and problem lists for reconciliation ED course, including vitals, labs, imaging, treatment and response to treatment Triage notes, nursing and pharmacy notes and ED provider's notes Notable results as noted in HPI   Assessment and Plan: * Sepsis secondary to catheter associated UTI (HCC) History of Klebsiella UTI with bacteremia 02/26/2023 Chronic Foley catheter Sepsis criteria include fever, tachycardia and tachypnea.  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) Increased nursing assistance for communication of needs Continue memantine, quetiapine and Trileptal    Obesity, Class III, BMI 40-49.9 (morbid obesity) (HCC) Complicating factor to overall prognosis and care   Decubitus ulcers History of sacral and heel ulcers Wound care consult for decubitus care and recommendations Decubitus precautions      Type II diabetes mellitus with renal  manifestations (HCC) Sliding scale insulin coverage   Hypertension Blood pressure stable so we will continue home amlodipine   Chronic anticoagulation - on Eliquis On Eliquis for history of recurrent DVT  we will continue     DVT prophylaxis: Eliquis  Consults: none  Advance Care Planning:   Code Status: Prior   Family Communication: none  Disposition Plan: Back to previous home  environment  Severity of Illness: The appropriate patient status for this patient is INPATIENT. Inpatient status is judged to be reasonable and necessary in order to provide the required intensity of service to ensure the patient's safety. The patient's presenting symptoms, physical exam findings, and initial radiographic and laboratory data in the context of their chronic comorbidities is felt to place them at high risk for further clinical deterioration. Furthermore, it is not anticipated that the patient will be medically stable for discharge from the hospital within 2 midnights of admission.   * I certify that at the point of admission it is my clinical judgment that the patient will require inpatient hospital care spanning beyond 2 midnights from the point of admission due to high intensity of service, high risk for further deterioration and high frequency of surveillance required.*  Author: Andris Baumann, MD 04/12/2023 2:04 AM  For on call review [email or web address].

## 2023-04-12 NOTE — Progress Notes (Signed)
CODE SEPSIS - PHARMACY COMMUNICATION  **Broad Spectrum Antibiotics should be administered within 1 hour of Sepsis diagnosis**  Time Code Sepsis Called/Page Received:  10/11 @ 2344  Antibiotics Ordered: Vanc, cefepime, flagyl  Time of 1st antibiotic administration: cefepime 2 gm IV X 1 on 10/12 @ 0037  Additional action taken by pharmacy:   If necessary, Name of Provider/Nurse Contacted:     Robynn Marcel D ,PharmD Clinical Pharmacist  04/12/2023  1:06 AM

## 2023-04-13 DIAGNOSIS — Z433 Encounter for attention to colostomy: Secondary | ICD-10-CM

## 2023-04-13 DIAGNOSIS — A419 Sepsis, unspecified organism: Secondary | ICD-10-CM | POA: Diagnosis not present

## 2023-04-13 DIAGNOSIS — N179 Acute kidney failure, unspecified: Secondary | ICD-10-CM

## 2023-04-13 DIAGNOSIS — N390 Urinary tract infection, site not specified: Secondary | ICD-10-CM | POA: Diagnosis not present

## 2023-04-13 DIAGNOSIS — L8990 Pressure ulcer of unspecified site, unspecified stage: Secondary | ICD-10-CM | POA: Insufficient documentation

## 2023-04-13 LAB — CBC
HCT: 27.5 % — ABNORMAL LOW (ref 39.0–52.0)
Hemoglobin: 8.6 g/dL — ABNORMAL LOW (ref 13.0–17.0)
MCH: 27.3 pg (ref 26.0–34.0)
MCHC: 31.3 g/dL (ref 30.0–36.0)
MCV: 87.3 fL (ref 80.0–100.0)
Platelets: 201 10*3/uL (ref 150–400)
RBC: 3.15 MIL/uL — ABNORMAL LOW (ref 4.22–5.81)
RDW: 15.8 % — ABNORMAL HIGH (ref 11.5–15.5)
WBC: 8.4 10*3/uL (ref 4.0–10.5)
nRBC: 0 % (ref 0.0–0.2)

## 2023-04-13 LAB — BASIC METABOLIC PANEL
Anion gap: 9 (ref 5–15)
BUN: 37 mg/dL — ABNORMAL HIGH (ref 8–23)
CO2: 19 mmol/L — ABNORMAL LOW (ref 22–32)
Calcium: 8.2 mg/dL — ABNORMAL LOW (ref 8.9–10.3)
Chloride: 102 mmol/L (ref 98–111)
Creatinine, Ser: 2.34 mg/dL — ABNORMAL HIGH (ref 0.61–1.24)
GFR, Estimated: 30 mL/min — ABNORMAL LOW (ref >60)
Glucose, Bld: 103 mg/dL — ABNORMAL HIGH (ref 70–99)
Potassium: 4.4 mmol/L (ref 3.5–5.1)
Sodium: 130 mmol/L — ABNORMAL LOW (ref 135–145)

## 2023-04-13 NOTE — Plan of Care (Signed)
  Problem: Clinical Measurements: Goal: Diagnostic test results will improve Outcome: Progressing   Problem: Nutrition: Goal: Adequate nutrition will be maintained Outcome: Progressing   Problem: Coping: Goal: Level of anxiety will decrease Outcome: Progressing   Problem: Elimination: Goal: Will not experience complications related to bowel motility Outcome: Progressing Goal: Will not experience complications related to urinary retention Outcome: Progressing   Problem: Pain Managment: Goal: General experience of comfort will improve Outcome: Progressing   Problem: Safety: Goal: Ability to remain free from injury will improve Outcome: Progressing

## 2023-04-13 NOTE — Plan of Care (Signed)

## 2023-04-13 NOTE — Progress Notes (Addendum)
PROGRESS NOTE  [name]    DOB: 1956/05/18, 67 y.o.  ZOX:[id number]    Code Status: Full Code   DOA: 04/11/2023   LOS: 1   Brief hospital course  [name] is a 67 y.o. 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  They presented to the ED for evaluation of fever and weakness starting on the day of arrival  ED course: Tmax 102.9, tachycardic to 115 on arrival and tachypneic to 22, O2 sats high 90s on room air. Labs: WBC normal at 8200 with normal lactic acid 1.8 and procalcitonin 0.3.Hemoglobin baseline at 9.9. CMP notable for creatinine 2.64 up from baseline of 2 a month prior, bicarb 19.  Urinalysis with large leukocyte esterase and rare bacteria. EKG: sinus tachycardia at 107 with no acute ST-T wave changes. Chest x-ray with no active disease.  They were initially treated with cefepime for sepsis of suspected urinary source as well as IV fluids.   Patient was admitted to medicine service for further workup and management of urosepsis as outlined in detail below.  04/13/23 -stable  Assessment & Plan  Principal Problem:   Sepsis secondary to catheter associated UTI [facility]) Active Problems:   Acute renal failure superimposed on stage 3b chronic kidney disease (HCC)  Sepsis secondary to catheter associated UTI (HCC) History of Klebsiella UTI with bacteremia 02/26/2023 Chronic Foley catheter Sepsis criteria include fever, tachycardia and tachypnea.  WBC 8.2 and lactic acid 1.8 and procalcitonin 0.3. catheter was replaced on admission. - Continue cefepime - Follow cultures, urine and blood  Acute renal failure  superimposed on stage 3b chronic kidney disease (HCC)- improving. 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) Increased nursing assistance for communication of needs Continue memantine, quetiapine and Trileptal - PT/OT evaluation     Obesity, Class III, BMI 40-49.9 (morbid obesity) (HCC) Complicating factor to overall prognosis and care   Decubitus ulcers- POA History of sacral and heel ulcers Wound care consult for decubitus care and recommendations Decubitus precautions   Type II diabetes mellitus with renal manifestations (HCC) Sliding scale insulin coverage   Hypertension - continue home amlodipine   Chronic anticoagulation - on Eliquis, h/o DVT - continue home Eliquis  Body mass index is 48.42 kg/m.  VTE ppx:  apixaban (ELIQUIS) tablet 5 mg   Diet:     Diet   Diet Carb Modified Fluid consistency: Thin; Room service appropriate? Yes; Fluid restriction: 1500 mL Fluid   Consultants: None   Subjective 04/13/23    Pt reports feeling well today. Denies complaints. Reports that he lives at home with his wife but she is currently also hospitalized.    Objective   Vitals:   04/12/23 1545 04/12/23 1719 04/12/23 2328 04/13/23 0743  BP:  123/70 (!) 98/52 122/74  Pulse: 66 81 62 64  Resp: 18 18 18 15   Temp:  97.7 F (36.5 C) 97.7 F (36.5 C) 97.6 F (36.4 C)  TempSrc:  SpO2: 100% 99% 98% 100%  Weight:      Height:        Intake/Output Summary (Last 24 hours) at 04/13/2023 0801 Last data filed at 04/13/2023 0741 Gross per 24 hour  Intake 1340 ml  Output 1250 ml  Net 90 ml   Filed Weights   04/11/23 2204  Weight: 136.1 kg    Physical Exam:  General: awake, alert, NAD HEENT: atraumatic, clear  conjunctiva, anicteric sclera, MMM, hearing grossly normal Respiratory: normal respiratory effort. Cardiovascular: quick capillary refill Gastrointestinal: soft, NT, ND. Ostomy in place and filled with soft stool Nervous: A&O x3. normal speech Extremities: moves all equally, no edema, normal tone Skin: dry, intact, normal temperature, normal color. No rashes, lesions or ulcers on exposed skin Psychiatry: normal mood, congruent affect  Labs   I have personally reviewed the following labs and imaging studies CBC    Component Value Date/Time   WBC 8.4 04/13/2023 0450   RBC 3.15 (L) 04/13/2023 0450   HGB 8.6 (L) 04/13/2023 0450   HCT 27.5 (L) 04/13/2023 0450   PLT 201 04/13/2023 0450   MCV 87.3 04/13/2023 0450   MCH 27.3 04/13/2023 0450   MCHC 31.3 04/13/2023 0450   RDW 15.8 (H) 04/13/2023 0450   LYMPHSABS 0.9 04/11/2023 2206   MONOABS 0.7 04/11/2023 2206   EOSABS 0.1 04/11/2023 2206   BASOSABS 0.0 04/11/2023 2206      Latest Ref Rng & Units 04/13/2023    4:50 AM 04/12/2023    3:44 AM 04/11/2023   10:06 PM  BMP  Glucose 70 - 99 mg/dL 213  086  578   BUN 8 - 23 mg/dL 37  43  44   Creatinine 0.61 - 1.24 mg/dL 4.69  6.29  5.28   Sodium 135 - 145 mmol/L 130  130  131   Potassium 3.5 - 5.1 mmol/L 4.4  4.3  5.0   Chloride 98 - 111 mmol/L 102  102  99   CO2 22 - 32 mmol/L 19  22  19    Calcium 8.9 - 10.3 mg/dL 8.2  8.1  8.8     DG Chest Port 1 View  Result Date: 04/12/2023 CLINICAL DATA:  Weakness, fever EXAM: PORTABLE CHEST 1 VIEW COMPARISON:  02/26/2023 FINDINGS: The heart size and mediastinal contours are within normal limits. Both lungs are clear. The visualized skeletal structures are unremarkable. IMPRESSION: No active disease. Electronically Signed   By: Charlett Nose M.D.   On: 04/12/2023 00:24    Disposition Plan & Communication  Patient status: Inpatient  Admitted From: Home Planned disposition location: TBD Anticipated discharge date: 10/14  pending culture driven  antibiotic change and PT evaluation for disposition planning   Family Communication: none at bedside    Author: [name], DO [facility] 04/13/2023, 8:01 AM   Available by Epic secure chat 7AM-7PM. If 7PM-7AM, please contact night-coverage.  [facility] contact information found on [email or web address].

## 2023-04-14 DIAGNOSIS — A498 Other bacterial infections of unspecified site: Secondary | ICD-10-CM | POA: Diagnosis not present

## 2023-04-14 DIAGNOSIS — A419 Sepsis, unspecified organism: Secondary | ICD-10-CM | POA: Diagnosis not present

## 2023-04-14 DIAGNOSIS — N179 Acute kidney failure, unspecified: Secondary | ICD-10-CM | POA: Diagnosis not present

## 2023-04-14 DIAGNOSIS — N390 Urinary tract infection, site not specified: Secondary | ICD-10-CM | POA: Diagnosis not present

## 2023-04-14 LAB — CBC
HCT: 28.7 % — ABNORMAL LOW (ref 39.0–52.0)
Hemoglobin: 9.1 g/dL — ABNORMAL LOW (ref 13.0–17.0)
MCH: 27.1 pg (ref 26.0–34.0)
MCHC: 31.7 g/dL (ref 30.0–36.0)
MCV: 85.4 fL (ref 80.0–100.0)
Platelets: 214 10*3/uL (ref 150–400)
RBC: 3.36 MIL/uL — ABNORMAL LOW (ref 4.22–5.81)
RDW: 15.8 % — ABNORMAL HIGH (ref 11.5–15.5)
WBC: 6.6 10*3/uL (ref 4.0–10.5)
nRBC: 0 % (ref 0.0–0.2)

## 2023-04-14 LAB — BASIC METABOLIC PANEL
Anion gap: 9 (ref 5–15)
BUN: 40 mg/dL — ABNORMAL HIGH (ref 8–23)
CO2: 20 mmol/L — ABNORMAL LOW (ref 22–32)
Calcium: 8.5 mg/dL — ABNORMAL LOW (ref 8.9–10.3)
Chloride: 105 mmol/L (ref 98–111)
Creatinine, Ser: 2.5 mg/dL — ABNORMAL HIGH (ref 0.61–1.24)
GFR, Estimated: 27 mL/min — ABNORMAL LOW (ref >60)
Glucose, Bld: 112 mg/dL — ABNORMAL HIGH (ref 70–99)
Potassium: 4.7 mmol/L (ref 3.5–5.1)
Sodium: 134 mmol/L — ABNORMAL LOW (ref 135–145)

## 2023-04-14 MED ORDER — SODIUM CHLORIDE 0.9 % IV SOLN
3.0000 g | Freq: Three times a day (TID) | INTRAVENOUS | Status: DC
  Administered 2023-04-14 – 2023-04-18 (×12): 3 g via INTRAVENOUS
  Filled 2023-04-14 (×13): qty 8

## 2023-04-14 NOTE — Progress Notes (Signed)
PROGRESS NOTE  [name]    DOB: 15-Feb-1956, 67 y.o.  XBJ:[id number]    Code Status: Full Code   DOA: 04/11/2023   LOS: 2  Brief hospital course  [name] is a 67 y.o. 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  They presented to the ED for evaluation of fever and weakness starting on the day of arrival  ED course: Tmax 102.9, tachycardic to 115 on arrival and tachypneic to 22, O2 sats high 90s on room air. Labs: WBC normal at 8200 with normal lactic acid 1.8 and procalcitonin 0.3.Hemoglobin baseline at 9.9. CMP notable for creatinine 2.64 up from baseline of 2 a month prior, bicarb 19.  Urinalysis with large leukocyte esterase and rare bacteria. EKG: sinus tachycardia at 107 with no acute ST-T wave changes. Chest x-ray with no active disease.  They were initially treated with cefepime for sepsis of suspected urinary source as well as IV fluids.   Patient was admitted to medicine service for further workup and management of urosepsis as outlined in detail below.  04/14/23 -remains stable on IV antibiotics with cultures pending. PT evaluated and recommended SNF at discharge. TOC engaged for placement  Assessment & Plan  Principal Problem:   Sepsis secondary to catheter associated UTI [facility]) Active Problems:   Acute renal failure superimposed on stage 3b chronic kidney disease (HCC)   Colostomy care (HCC)   Pressure injury of skin  Sepsis secondary to catheter associated UTI (HCC) History of Klebsiella UTI with bacteremia 02/26/2023 Chronic Foley catheter Sepsis criteria include fever, tachycardia and  tachypnea.  WBC 8.2 and lactic acid 1.8 and procalcitonin 0.3. catheter was replaced on admission. - Continue cefepime - Follow cultures, urine and blood  - blood NGTD  - urine- positive for e faecalis and GNR which was reincubated for better growth. 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) Increased nursing assistance for communication of needs Continue memantine, quetiapine and Trileptal - PT/OT evaluation     Obesity, Class III, BMI 40-49.9 (morbid obesity) (HCC) Complicating factor to overall prognosis and care   Decubitus ulcers- POA History of sacral and heel ulcers Wound care consult for decubitus care and recommendations Decubitus precautions   Type II diabetes mellitus with renal manifestations (HCC) Sliding scale insulin coverage   Hypertension - continue home amlodipine   Chronic anticoagulation - on Eliquis, h/o DVT - continue home Eliquis  Body mass index is 48.42 kg/m.  VTE ppx:  apixaban (ELIQUIS) tablet 5 mg   Diet:     Diet   Diet Carb Modified Fluid consistency: Thin; Room service appropriate? Yes; Fluid restriction: 1500 mL Fluid   Consultants: None   Subjective 04/14/23    Pt reports feeling well today. Denies complaints. He is agreeable to SNF  Objective   Vitals:   04/12/23 2328 04/13/23 0743 04/13/23 1535 04/13/23 2307  BP: (!) 98/52 122/74 (!) 103/53 (!) 111/58  Pulse: 62 64 63 (!) 58  Resp: 18 15 15 18   Temp: 97.7 F (36.5 C) 97.6 F (36.4 C) 97.9 F (36.6 C) 97.8 F (36.6 C)  TempSrc:   Oral    SpO2: 98% 100% 100% 100%  Weight:      Height:        Intake/Output Summary (Last 24 hours) at 04/14/2023 0725 Last data filed at 04/14/2023 0300 Gross per 24 hour  Intake 560 ml  Output 1825 ml  Net -1265 ml   Filed Weights   04/11/23 2204  Weight: 136.1 kg    Physical Exam:  General: awake, alert, NAD HEENT: atraumatic, clear conjunctiva, anicteric sclera, MMM, hearing grossly normal Respiratory: normal respiratory effort. Cardiovascular: quick capillary refill Gastrointestinal: soft, NT, ND. Ostomy in place and filled with soft stool Nervous: A&O x3. normal speech Extremities: moves all equally, no edema, normal tone Skin: dry, intact, normal temperature, normal color. No rashes, lesions or ulcers on exposed skin Psychiatry: normal mood, congruent affect  Labs   I have personally reviewed the following labs and imaging studies CBC    Component Value Date/Time   WBC 6.6 04/14/2023 0407   RBC 3.36 (L) 04/14/2023 0407   HGB 9.1 (L) 04/14/2023 0407   HCT 28.7 (L) 04/14/2023 0407   PLT 214 04/14/2023 0407   MCV 85.4 04/14/2023 0407   MCH 27.1 04/14/2023 0407   MCHC 31.7 04/14/2023 0407   RDW 15.8 (H) 04/14/2023 0407   LYMPHSABS 0.9 04/11/2023 2206   MONOABS 0.7 04/11/2023 2206   EOSABS 0.1 04/11/2023 2206   BASOSABS 0.0 04/11/2023 2206      Latest Ref Rng & Units 04/14/2023    4:07 AM 04/13/2023    4:50 AM 04/12/2023    3:44 AM  BMP  Glucose 70 - 99 mg/dL 272  536  644   BUN 8 - 23 mg/dL 40  37  43   Creatinine 0.61 - 1.24 mg/dL 0.34  7.42  5.95   Sodium 135 - 145 mmol/L 134  130  130   Potassium 3.5 - 5.1 mmol/L 4.7  4.4  4.3   Chloride 98 - 111 mmol/L 105  102  102   CO2 22 - 32 mmol/L 20  19  22    Calcium 8.9 - 10.3 mg/dL 8.5  8.2  8.1     No results found.  Disposition Plan & Communication  Patient status: Inpatient  Admitted From: Home Planned disposition location: SNF Anticipated discharge date: 10/15  pending culture driven antibiotic change  and dispo plan  Family Communication: none at bedside    Author: [name], DO [facility] 04/14/2023, 7:25 AM   Available by Epic secure chat 7AM-7PM. If 7PM-7AM, please contact night-coverage.  [facility] contact information found on [email or web address].

## 2023-04-14 NOTE — Plan of Care (Signed)
  Problem: Respiratory: Goal: Ability to maintain adequate ventilation will improve Outcome: Progressing   Problem: Education: Goal: Knowledge of General Education information will improve Description: Including pain rating scale, medication(s)/side effects and non-pharmacologic comfort measures Outcome: Progressing   Problem: Activity: Goal: Risk for activity intolerance will decrease Outcome: Progressing   Problem: Pain Managment: Goal: General experience of comfort will improve Outcome: Progressing

## 2023-04-14 NOTE — Evaluation (Signed)
Physical Therapy Evaluation Patient Details Name: [name] MRN: [id number] DOB: 28-Nov-1955 Today's Date: 04/14/2023  History of Present Illness  Pt is a 67 y.o. male presenting to hospital 04/11/23 with c/o fevers and weakness.  Pt admitted with sepsis secondary to catheter associated UTI, acute renal failure superimposed on stage 3b CKD, hyponatremia, decubitus ulcers (sacral and heel ulcers).  PMH includes chronic indwelling foley catheter, htn, DM, CKD, CHF, TBI, dementia, neurogenic bladder, bipolar disorder, bladder mass, colostomy, back sx.  Clinical Impression  Prior to recent medical concerns, pt reports requiring assist for bed mobility and transfers (w/c level); pt lives with his wife who is currently in the hospital (pt reports she should be going home today).  No c/o pain during session.  Currently pt is max assist for bed mobility; SBA sitting balance; and 1-2 assist for transfers (see below for additional details).  Pt oriented x4 but demonstrating decreased awareness and generalized confusion at times.  Pt would currently benefit from skilled PT to address noted impairments and functional limitations (see below for any additional details).  Upon hospital discharge, pt would benefit from ongoing therapy.     If plan is discharge home, recommend the following: Two people to help with walking and/or transfers   Can travel by private vehicle   No    Equipment Recommendations Stites lift;Hospital bed  Recommendations for Other Services  OT consult    Functional Status Assessment Patient has had a recent decline in their functional status and demonstrates the ability to make significant improvements in function in a reasonable and predictable amount of time.     Precautions / Restrictions Precautions Precautions: Fall Precaution Comments: LLQ Ostomy; prevalon boots Restrictions Weight Bearing Restrictions: No      Mobility  Bed Mobility Overal bed mobility:  Needs Assistance Bed Mobility: Supine to Sit     Supine to sit: Max assist, HOB elevated, Used rails     General bed mobility comments: assist for B LE's and trunk    Transfers Overall transfer level: Needs assistance Equipment used: Rolling walker (2 wheels) Transfers: Sit to/from Stand, Bed to chair/wheelchair/BSC Sit to Stand: Max assist, +2 safety/equipment, Contact guard assist   Step pivot transfers: Min assist, +2 safety/equipment       General transfer comment: pt unable to stand 1st trial with 1 assist; 2nd trial able to stand with max assist x1 plus CGA of second (B feet blocked to prevent sliding forward; vc's for UE positioning and overall technique; RW use); stand step turn bed to recliner towards R with RW use min assist x1 plus 2nd assist present for safety (increased effort to take steps; heavy UE support noted through RW; vc's for sequencing); mod assist to control descent sitting in recliner    Ambulation/Gait               General Gait Details: Deferred (pt non-ambulatory baseline)  Stairs            Wheelchair Mobility     Tilt Bed    Modified Rankin (Stroke Patients Only)       Balance Overall balance assessment: Needs assistance Sitting-balance support: Single extremity supported, Feet supported Sitting balance-Leahy Scale: Fair Sitting balance - Comments: steady static sitting   Standing balance support: Bilateral upper extremity supported, Reliant on assistive device for balance Standing balance-Leahy Scale: Fair Standing balance comment: steady standing static with heavy B UE support through RW  Pertinent Vitals/Pain Pain Assessment Pain Assessment: No/denies pain Vitals (HR and SpO2 on room air) stable and WFL throughout treatment session.    Home Living Family/patient expects to be discharged to:: Private residence Living Arrangements: Spouse/significant other (Pt reports spouse  should be discharging from hospital today) Available Help at Discharge: Family;Available PRN/intermittently Type of Home: House Home Access:  (pt reports 1 STE no railing but previous entry reports ramped entrance)       Home Layout: One level Home Equipment: Wheelchair - Forensic psychologist (2 wheels);Shower seat - built in;Grab bars - tub/shower (previous entry reports power w/c)      Prior Function Prior Level of Function : Needs assist       Physical Assist : Mobility (physical);ADLs (physical) Mobility (physical): Bed mobility;Transfers ADLs (physical): IADLs;Toileting;Dressing;Bathing Mobility Comments: Pt reports spouse assists with bed mobility and transfer to/from w/c (sometimes his goddaughter assists as well); pt reports propelling manual w/c short distances with UE/LE's on own (otherwise requires assist); spends most of his day in recliner ADLs Comments: Per prior hospital visit therapy note "Spouse assists with dressing, bathing, and IADLs. sink bath primarily."     Extremity/Trunk Assessment   Upper Extremity Assessment Upper Extremity Assessment:  (B shoulder flexion 4+/5; elbow flexion/extension 5/5; strong B hand grip strength)    Lower Extremity Assessment Lower Extremity Assessment: RLE deficits/detail;LLE deficits/detail RLE Deficits / Details: hip flexion 2/5; knee extension 4/5; knee flexion 3+5; 0/5 DF LLE Deficits / Details: hip flexion 2+/5; knee extension 4/5; knee flexion 4+/5; 0/5 DF    Cervical / Trunk Assessment Cervical / Trunk Assessment: Other exceptions Cervical / Trunk Exceptions: forward head/shoulders  Communication   Communication Communication: No apparent difficulties Cueing Techniques: Verbal cues;Visual cues;Tactile cues  Cognition Arousal: Alert Behavior During Therapy: Flat affect Overall Cognitive Status: No family/caregiver present to determine baseline cognitive functioning                                  General Comments: A&O x4; increased time to respond to questions; pt not always forthcoming with information during session (some generalized confusion noted)        General Comments General comments (skin integrity, edema, etc.): ostomy appearing intact beginning/end of session.  Nursing cleared pt for participation in physical therapy.  Pt agreeable to PT session.    Exercises  Transfer training   Assessment/Plan    PT Assessment Patient needs continued PT services  PT Problem List Decreased strength;Decreased activity tolerance;Decreased balance;Decreased mobility;Decreased safety awareness;Decreased skin integrity       PT Treatment Interventions DME instruction;Functional mobility training;Therapeutic activities;Therapeutic exercise;Balance training;Patient/family education    PT Goals (Current goals can be found in the Care Plan section)  Acute Rehab PT Goals Patient Stated Goal: to improve mobility PT Goal Formulation: With patient Time For Goal Achievement: 04/28/23 Potential to Achieve Goals: Good    Frequency Min 1X/week     Co-evaluation               AM-PAC PT "6 Clicks" Mobility  Outcome Measure Help needed turning from your back to your side while in a flat bed without using bedrails?: A Little Help needed moving from lying on your back to sitting on the side of a flat bed without using bedrails?: A Lot Help needed moving to and from a bed to a chair (including a wheelchair)?: A Lot Help needed standing up from a chair using your arms (  e.g., wheelchair or bedside chair)?: A Lot Help needed to walk in hospital room?: Total Help needed climbing 3-5 steps with a railing? : Total 6 Click Score: 11    End of Session Equipment Utilized During Treatment: Gait belt (gait belt up high away from ostomy) Activity Tolerance: Patient tolerated treatment well Patient left: in chair;with call bell/phone within reach;with chair alarm set;Other (comment) (B heels  floating via pillow support) Nurse Communication: Mobility status;Precautions PT Visit Diagnosis: Other abnormalities of gait and mobility (R26.89);Muscle weakness (generalized) (M62.81)    Time: 1610-9604 PT Time Calculation (min) (ACUTE ONLY): 36 min   Charges:   PT Evaluation $PT Eval Low Complexity: 1 Low PT Treatments $Therapeutic Activity: 8-22 mins PT General Charges $$ ACUTE PT VISIT: 1 Visit        Hendricks Limes, PT 04/14/23, 10:20 AM

## 2023-04-15 DIAGNOSIS — N179 Acute kidney failure, unspecified: Secondary | ICD-10-CM | POA: Diagnosis not present

## 2023-04-15 DIAGNOSIS — A419 Sepsis, unspecified organism: Secondary | ICD-10-CM | POA: Diagnosis not present

## 2023-04-15 DIAGNOSIS — N390 Urinary tract infection, site not specified: Secondary | ICD-10-CM | POA: Diagnosis not present

## 2023-04-15 LAB — BASIC METABOLIC PANEL
Anion gap: 8 (ref 5–15)
BUN: 41 mg/dL — ABNORMAL HIGH (ref 8–23)
CO2: 18 mmol/L — ABNORMAL LOW (ref 22–32)
Calcium: 8.2 mg/dL — ABNORMAL LOW (ref 8.9–10.3)
Chloride: 107 mmol/L (ref 98–111)
Creatinine, Ser: 2.22 mg/dL — ABNORMAL HIGH (ref 0.61–1.24)
GFR, Estimated: 32 mL/min — ABNORMAL LOW (ref >60)
Glucose, Bld: 103 mg/dL — ABNORMAL HIGH (ref 70–99)
Potassium: 4.6 mmol/L (ref 3.5–5.1)
Sodium: 133 mmol/L — ABNORMAL LOW (ref 135–145)

## 2023-04-15 LAB — CBC
HCT: 27.5 % — ABNORMAL LOW (ref 39.0–52.0)
Hemoglobin: 8.7 g/dL — ABNORMAL LOW (ref 13.0–17.0)
MCH: 26.9 pg (ref 26.0–34.0)
MCHC: 31.6 g/dL (ref 30.0–36.0)
MCV: 84.9 fL (ref 80.0–100.0)
Platelets: 230 10*3/uL (ref 150–400)
RBC: 3.24 MIL/uL — ABNORMAL LOW (ref 4.22–5.81)
RDW: 15.9 % — ABNORMAL HIGH (ref 11.5–15.5)
WBC: 5.4 10*3/uL (ref 4.0–10.5)
nRBC: 0 % (ref 0.0–0.2)

## 2023-04-15 NOTE — Progress Notes (Signed)
PROGRESS NOTE  [name]    DOB: 05/08/1956, 67 y.o.  NFA:[id number]    Code Status: Full Code   DOA: 04/11/2023   LOS: 3  Brief hospital course  [name] is a 67 y.o. 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  They presented to the ED for evaluation of fever and weakness starting on the day of arrival  ED course: Tmax 102.9, tachycardic to 115 on arrival and tachypneic to 22, O2 sats high 90s on room air. Labs: WBC normal at 8200 with normal lactic acid 1.8 and procalcitonin 0.3.Hemoglobin baseline at 9.9. CMP notable for creatinine 2.64 up from baseline of 2 a month prior, bicarb 19.  Urinalysis with large leukocyte esterase and rare bacteria. EKG: sinus tachycardia at 107 with no acute ST-T wave changes. Chest x-ray with no active disease.  They were initially treated with cefepime for sepsis of suspected urinary source as well as IV fluids.   Patient was admitted to medicine service for further workup and management of urosepsis as outlined in detail below.  10/14- UxCx sensitivities returned with Enterococcus faecalis and another GNR so unfortunately, was not being adequately covered with cefepime but he remained in stable condition. Changed to unasyn.  PT evaluated and recommended SNF at discharge. TOC engaged for placement  04/15/23 -remains stable on unasyn. Second infectious agent identified now as ACINETOBACTER BAUMANNII. Remain on unasyn and follow for sensitivities.   Assessment & Plan  Principal Problem:   Sepsis secondary to catheter associated UTI [facility]) Active Problems:   Acute renal failure  superimposed on stage 3b chronic kidney disease (HCC)   Colostomy care (HCC)   Pressure injury of skin   Enterococcus faecalis infection  Sepsis secondary to catheter associated UTI (HCC)- urine culture positive for Enterococcus faecalis and less significantly acinetobacter baumannii.  Chronic Foley catheter- exchanged on admission prior to culture Sepsis criteria include fever, tachycardia and tachypnea.  WBC 8.2 and lactic acid 1.8 and procalcitonin 0.3. sepsis criteria have resolved and he is stable ORA - Continue unasyn - Follow cultures, urine and blood  - blood NGTD  - urine- positive for e faecalis and acinetobacter baumannii.   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) Increased nursing assistance for communication of needs Continue memantine, quetiapine and Trileptal - PT/OT evaluation     Obesity, Class III, BMI 40-49.9 (morbid obesity) (HCC) Complicating factor to overall prognosis and care   Decubitus ulcers- POA History of sacral and heel ulcers Wound care consult for decubitus care and recommendations Decubitus precautions   Type II diabetes mellitus with renal manifestations (HCC) Sliding scale insulin coverage   Hypertension - continue home amlodipine   Chronic anticoagulation - on Eliquis, h/o DVT - continue home Eliquis  Body mass index is 48.42 kg/m.  VTE ppx:  apixaban (ELIQUIS) tablet 5 mg   Diet:  Diet   Diet Carb Modified Fluid consistency: Thin; Room service appropriate? Yes; Fluid restriction: 1500 mL Fluid   Consultants: None   Subjective 04/15/23    Pt reports feeling well  today. Denies complaints. He is agreeable to SNF   Objective   Vitals:   04/13/23 2307 04/14/23 0851 04/14/23 1553 04/14/23 2324  BP: (!) 111/58 139/68 (!) 141/72 106/62  Pulse: (!) 58 71 73 (!) 55  Resp: 18 16 18 16   Temp: 97.8 F (36.6 C) 97.8 F (36.6 C) 97.6 F (36.4 C) 98 F (36.7 C)  TempSrc:  Oral  Oral  SpO2: 100% 100% 100% 98%  Weight:      Height:        Intake/Output Summary (Last 24 hours) at 04/15/2023 0726 Last data filed at 04/15/2023 0500 Gross per 24 hour  Intake 580 ml  Output 1600 ml  Net -1020 ml   Filed Weights   04/11/23 2204  Weight: 136.1 kg    Physical Exam:  General: awake, alert, NAD HEENT: atraumatic, clear conjunctiva, anicteric sclera, MMM, hearing grossly normal Respiratory: normal respiratory effort. Cardiovascular: quick capillary refill Gastrointestinal: soft, NT, ND. Ostomy in place and filled with soft stool Nervous: A&O x3. normal speech Extremities: moves all equally, no edema, normal tone Skin: dry, intact, normal temperature, normal color. No rashes, lesions or ulcers on exposed skin Psychiatry: normal mood, congruent affect  Labs   I have personally reviewed the following labs and imaging studies CBC    Component Value Date/Time   WBC 5.4 04/15/2023 0634   RBC 3.24 (L) 04/15/2023 0634   HGB 8.7 (L) 04/15/2023 0634   HCT 27.5 (L) 04/15/2023 0634   PLT 230 04/15/2023 0634   MCV 84.9 04/15/2023 0634   MCH 26.9 04/15/2023 0634   MCHC 31.6 04/15/2023 0634   RDW 15.9 (H) 04/15/2023 0634   LYMPHSABS 0.9 04/11/2023 2206   MONOABS 0.7 04/11/2023 2206   EOSABS 0.1 04/11/2023 2206   BASOSABS 0.0 04/11/2023 2206      Latest Ref Rng & Units 04/15/2023    6:34 AM 04/14/2023    4:07 AM 04/13/2023    4:50 AM  BMP  Glucose 70 - 99 mg/dL 161  096  045   BUN 8 - 23 mg/dL 41  40  37   Creatinine 0.61 - 1.24 mg/dL 4.09  8.11  9.14   Sodium 135 - 145 mmol/L 133  134  130   Potassium 3.5 - 5.1 mmol/L 4.6  4.7  4.4   Chloride 98  - 111 mmol/L 107  105  102   CO2 22 - 32 mmol/L 18  20  19    Calcium 8.9 - 10.3 mg/dL 8.2  8.5  8.2     No results found.  Disposition Plan & Communication  Patient status: Inpatient  Admitted From: Home Planned disposition location: SNF Anticipated discharge date: 10/16 pending dispo plan  Family Communication: none at bedside, wife is also hospitalized at another facility    Author: [name], DO [facility] 04/15/2023, 7:26 AM   Available by Epic secure chat 7AM-7PM. If 7PM-7AM, please contact night-coverage.  [facility] contact information found on [email or web address].

## 2023-04-15 NOTE — TOC Initial Note (Addendum)
Transition of Care [facility]) - Initial/Assessment Note    Patient Details  Name: [name] MRN: [id number] Date of Birth: February 09, 1956  Transition of Care [facility]) CM/SW Contact:    Hetty Ely, RN Phone Number: 04/15/2023, 8:48 AM  Clinical Narrative:  CMA telephone assessment, patient lives at home with spouse, wife provides assistance with ADL's ambulation and transport to medical appointments. DME at home, hospital bed, wheelchair, rolling walker, lift device and grab bars in shower. [facility] RN was providing wound care at home to left heel and bilateral buttocks. Patient was also receiving [facility] PT. Patient also has an Ostomy. Patient voices being at [facility] 1-2 months ago, agrees to return per PT recommendations, however would like to see if other choices are available. CM to provide patient with list of bedoffers and assist with SNF placement process.                 Expected Discharge Plan: Skilled Nursing Facility Barriers to Discharge: Continued Medical Work up   Patient Goals and CMS Choice Patient states their goals for this hospitalization and ongoing recovery are:: To return to SNF   Choice offered to / list presented to : Patient      Expected Discharge Plan and Services     Post Acute Care Choice: Skilled Nursing Facility Living arrangements for the past 2 months: Single Family Home                 DME Arranged: N/A DME Agency: NA       HH Arranged: NA HH Agency: NA        Prior Living Arrangements/Services Living arrangements for the past 2 months: Single Family Home Lives with:: Spouse Patient language and need for interpreter reviewed:: Yes Do you feel safe going back to the place where you live?: Yes      Need for Family Participation in Patient Care: Yes (Comment) Care giver support system in place?: Yes (comment) Current home services: DME, Home RN, Home PT Criminal Activity/Legal Involvement Pertinent to Current  Situation/Hospitalization: No - Comment as needed  Activities of Daily Living      Permission Sought/Granted                  Emotional Assessment Appearance:: Other (Comment Required (Telephone Assessment) Attitude/Demeanor/Rapport: Engaged Affect (typically observed): Unable to Assess Orientation: : Oriented to Self, Oriented to Place, Oriented to  Time, Oriented to Situation Alcohol / Substance Use: Not Applicable Psych Involvement: No (comment)  Admission diagnosis:  Acute UTI [N39.0] AKI (acute kidney injury) (HCC) [N17.9] Sepsis secondary to UTI (HCC) [A41.9, N39.0] Patient Active Problem List   Diagnosis Date Noted   Enterococcus faecalis infection 04/14/2023   Colostomy care (HCC) 04/13/2023   Pressure injury of skin 04/13/2023   Pressure injury of right heel, stage 3 (HCC) 03/05/2023   Pressure injury of left heel, stage 3 (HCC) 03/05/2023   Bacteremia due to Klebsiella pneumoniae 03/03/2023   Urinary tract infection associated with indwelling urethral catheter (HCC) 03/03/2023   CKD (chronic kidney disease), stage III (HCC) 02/27/2023   (HFpEF) heart failure with preserved ejection fraction (HCC) 02/27/2023   Anemia of chronic kidney failure, stage 3 (moderate) (HCC) 09/24/2022   Gross hematuria 09/22/2022   Hydronephrosis of right kidney 09/20/2022   Metabolic acidosis 09/20/2022   Hyperkalemia 09/19/2022   Mass of urinary bladder 09/19/2022   Sepsis due to gram-negative UTI (HCC) 03/20/2022   Type 2 diabetes mellitus without complications (HCC) 03/20/2022  Dyslipidemia 03/20/2022   Complicated UTI (urinary tract infection) 02/22/2022   Acute renal failure superimposed on stage 3b chronic kidney disease (HCC) 02/22/2022   Acute on chronic diastolic CHF (congestive heart failure) (HCC) 02/22/2022   Obesity with body mass index of 30.0-39.9 02/22/2022   Cellulitis of left thigh 02/22/2022   Essential hypertension 01/13/2022   Seizure prophylaxis 01/13/2022    Acute pyelonephritis 01/05/2022   Fever 12/16/2021   Hypotension 10/09/2021   Abdominal distension 10/08/2021   AKI (acute kidney injury) (HCC) 10/08/2021   Sacral decubitus ulcer 10/07/2021   MSSA bacteremia 10/07/2021   Severe sepsis (HCC) 10/06/2021   Sepsis secondary to catheter associated UTI (HCC) 07/28/2021   COVID-19 virus infection 07/28/2021   Chronic indwelling Foley catheter 06/22/2021   Colostomy in place [facility]) 06/22/2021   TBI (traumatic brain injury) (HCC)    Healed ulcer of foot on examination 04/14/2021   Acute UTI 02/23/2021   Osteomyelitis of vertebra, sacral and sacrococcygeal region [facility]) 10/18/2020   Hyponatremia 07/07/2020   Encephalopathy 07/07/2020   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) 07/07/2020   Anemia of chronic disease 07/07/2020   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) 06/01/2020   Sacral osteomyelitis (HCC) 04/13/2020   Urinary retention 03/19/2020   History of DVT (deep vein thrombosis) 03/19/2020   Pressure injury of sacral region, stage 4 (HCC) 03/19/2020   Severe sepsis with septic shock (HCC) 03/18/2020   Obesity, Class III, BMI 40-49.9 (morbid obesity) (HCC) 03/18/2020   Cellulitis 03/18/2020   Acute cystitis without hematuria    Stage 3b chronic kidney disease (CKD) (HCC) - baseline SCr 1.8-1.9 01/20/2020   Decubitus ulcers 01/17/2020   Sepsis (HCC) 01/16/2020   Cellulitis of sacral region 01/16/2020   Acute kidney injury superimposed on chronic kidney disease (HCC) 01/16/2020   Acute metabolic encephalopathy 01/16/2020   Bipolar disorder, in full remission, most recent episode mixed (HCC) 12/29/2019   High risk medication use 10/25/2019   Noncompliance with treatment plan 10/25/2019   Bipolar I disorder, most recent episode mixed (HCC) 01/07/2019   GAD (generalized anxiety disorder) 01/07/2019   Insomnia due to medical condition 01/07/2019   Recurrent deep vein  thrombosis (DVT) of lower extremity (HCC) 03/06/2016   Bipolar disorder (HCC) 01/03/2016   Lithium toxicity 01/02/2016   Chronic anticoagulation - on Eliquis 07/06/2014   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/06/2014   Hyperlipidemia 07/06/2014   Leg swelling 07/06/2014   Obesity, Class II, BMI 35-39.9, with comorbidity 07/06/2014   On medication for venous thromboembolism 07/06/2014   Sleep apnea 07/06/2014   Type II diabetes mellitus with renal manifestations (HCC) 07/06/2014   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   Venous thromboembolism (VTE) 07/06/2014   PCP:  Dione Booze, MD Pharmacy:   [facility] [address] [place] - [address] [place] [address] Phone: [phone] Fax: [phone]     Social Determinants of Health (SDOH) Social History: SDOH Screenings   Food Insecurity: No Food Insecurity (02/27/2023)  Housing: Low Risk  (02/27/2023)  Transportation Needs: No Transportation Needs (02/27/2023)  Utilities: Not At Risk (02/27/2023)  Depression (PHQ2-9): Medium Risk (12/04/2022)  Financial Resource Strain: Low Risk  (12/31/2020)   Received from [facility], [facility]  Physical Activity: Inactive (09/09/2017)  Social Connections: Unknown (09/09/2017)  Stress: No Stress Concern Present (09/09/2017)  Tobacco Use: Low Risk  (04/11/2023)   SDOH Interventions:     Readmission Risk Interventions  03/05/2023   12:55 PM 02/28/2023   11:21 AM 03/23/2022   12:09 PM  Readmission Risk Prevention Plan  Transportation Screening  Complete Complete  Medication Review [employer])  Complete Complete  PCP or Specialist appointment within 3-5 days of discharge  Complete Complete  HRI or Home Care Consult  Complete Complete  SW Recovery Care/Counseling Consult  Complete Complete  Palliative Care Screening  Not Applicable Not Applicable  Skilled Nursing Facility Complete Not Applicable Not  Applicable

## 2023-04-15 NOTE — Care Management Important Message (Signed)
Important Message  Patient Details  Name: Tristram Milian MRN: [id number] Date of Birth: 1956/06/16   Important Message Given:  Yes - Medicare IM     Bernadette Hoit 04/15/2023, 11:30 AM

## 2023-04-15 NOTE — Progress Notes (Signed)
Physical Therapy Treatment Patient Details Name: [name] MRN: [id number] DOB: Mar 02, 1956 Today's Date: 04/15/2023   History of Present Illness Pt is a 67 y.o. male presenting to hospital 04/11/23 with c/o fevers and weakness.  Pt admitted with sepsis secondary to catheter associated UTI, acute renal failure superimposed on stage 3b CKD, hyponatremia, decubitus ulcers (sacral and heel ulcers).  PMH includes chronic indwelling foley catheter, htn, DM, CKD, CHF, TBI, dementia, neurogenic bladder, bipolar disorder, bladder mass, colostomy, back sx.    PT Comments  Pt resting in recliner upon PT arrival; agreeable to therapy.  During session pt stood x2 trials from recliner (max assist x1 plus CGA to min assist of 2nd)--pt requiring B feet blocked to prevent sliding forward and vc's for technique/positioning.  Pt able to take x5 very small steps in place in standing with heavy B UE support through RW (increased trunk flexion noted with fatigue requiring cueing for upright posture).  Will continue to focus on strengthening and progressive functional mobility per pt tolerance.    If plan is discharge home, recommend the following: Two people to help with walking and/or transfers   Can travel by private vehicle     No  Equipment Recommendations  [place] lift;Hospital bed    Recommendations for Other Services       Precautions / Restrictions Precautions Precautions: Fall Precaution Comments: LLQ Ostomy; prevalon boots Restrictions Weight Bearing Restrictions: No     Mobility  Bed Mobility               General bed mobility comments: Deferred (pt sitting in recliner beginning/end of session)    Transfers Overall transfer level: Needs assistance Equipment used: Rolling walker (2 wheels) Transfers: Sit to/from Stand Sit to Stand: Max assist, Min assist, Contact guard assist, +2 physical assistance           General transfer comment: max assist x1 plus CGA of 2nd  to stand 1st trial from recliner; max assist x1 plus min assist of 2nd to stand 2nd trial from recliner; B feet blocked to prevent sliding forward; vc's to increase BOS and slide feet back towards chair prior to standing; pt tending to walk feet back after partway standing; heavy UE support noted through RW)    Ambulation/Gait Ambulation/Gait assistance: Min assist, +2 physical assistance Gait Distance (Feet):  (pt took 5 steps B LE's in place) Assistive device: Rolling walker (2 wheels)   Gait velocity: decreased     General Gait Details: minimal step height noted; pt tending to stand in flexed trunk posture which increased with taking steps requiring vc's for upright posture   Stairs             Wheelchair Mobility     Tilt Bed    Modified Rankin (Stroke Patients Only)       Balance Overall balance assessment: Needs assistance Sitting-balance support: No upper extremity supported, Feet supported Sitting balance-Leahy Scale: Fair Sitting balance - Comments: steady static sitting   Standing balance support: Bilateral upper extremity supported, Reliant on assistive device for balance Standing balance-Leahy Scale: Fair Standing balance comment: steady standing static with heavy B UE support through RW                            Cognition Arousal: Alert Behavior During Therapy: Flat affect Overall Cognitive Status: No family/caregiver present to determine baseline cognitive functioning  General Comments: A&O x4; increased time to respond at times; some generalized confusion noted        Exercises      General Comments General comments (skin integrity, edema, etc.): ostomy appearing intact beginning/end of session.  Nursing cleared pt for participation in physical therapy.  Pt agreeable to PT session.      Pertinent Vitals/Pain Pain Assessment Pain Assessment: No/denies pain Vitals (HR and SpO2 on room  air) stable and WFL throughout treatment session.    Home Living                          Prior Function            PT Goals (current goals can now be found in the care plan section) Acute Rehab PT Goals Patient Stated Goal: to improve mobility PT Goal Formulation: With patient Time For Goal Achievement: 04/28/23 Potential to Achieve Goals: Good Progress towards PT goals: Progressing toward goals    Frequency    Min 1X/week      PT Plan      Co-evaluation              AM-PAC PT "6 Clicks" Mobility   Outcome Measure  Help needed turning from your back to your side while in a flat bed without using bedrails?: A Little Help needed moving from lying on your back to sitting on the side of a flat bed without using bedrails?: A Lot Help needed moving to and from a bed to a chair (including a wheelchair)?: A Lot Help needed standing up from a chair using your arms (e.g., wheelchair or bedside chair)?: A Lot Help needed to walk in hospital room?: Total Help needed climbing 3-5 steps with a railing? : Total 6 Click Score: 11    End of Session Equipment Utilized During Treatment: Gait belt (gait belt up high away from ostomy site) Activity Tolerance: Patient limited by fatigue Patient left: in chair;with call bell/phone within reach;with chair alarm set;Other (comment) (B heels floating via pillow support) Nurse Communication: Mobility status;Precautions PT Visit Diagnosis: Other abnormalities of gait and mobility (R26.89);Muscle weakness (generalized) (M62.81)     Time: 1610-9604 PT Time Calculation (min) (ACUTE ONLY): 20 min  Charges:    $Therapeutic Activity: 8-22 mins PT General Charges $$ ACUTE PT VISIT: 1 Visit                     Hendricks Limes, PT 04/15/23, 2:18 PM

## 2023-04-15 NOTE — Plan of Care (Signed)
  Problem: Education: Goal: Knowledge of General Education information will improve Description: Including pain rating scale, medication(s)/side effects and non-pharmacologic comfort measures Outcome: Progressing   Problem: Clinical Measurements: Goal: Ability to maintain clinical measurements within normal limits will improve Outcome: Progressing   

## 2023-04-15 NOTE — NC FL2 (Signed)
[place] MEDICAID FL2 LEVEL OF CARE FORM     IDENTIFICATION  Patient Name: [name] Birthdate: [birth year] Sex: male Admission Date (Current Location): 04/11/2023  [place] and [place] Number:  Chiropodist and Address:  [facility], [address] [place], [place] [address]      Provider Number: [id number]  Attending Physician Name and Address:  [name], MD  Relative Name and Phone Number:  [name], [name] (Spouse)  [phone] (    Current Level of Care: Hospital Recommended Level of Care: Skilled Nursing Facility Prior Approval Number:    Date Approved/Denied:   PASRR Number: [id number]  Discharge Plan: SNF    Current Diagnoses: Patient Active Problem List   Diagnosis Date Noted   Enterococcus faecalis infection 04/14/2023   Colostomy care (HCC) 04/13/2023   Pressure injury of skin 04/13/2023   Pressure injury of right heel, stage 3 (HCC) 03/05/2023   Pressure injury of left heel, stage 3 (HCC) 03/05/2023   Bacteremia due to Klebsiella pneumoniae 03/03/2023   Urinary tract infection associated with indwelling urethral catheter (HCC) 03/03/2023   CKD (chronic kidney disease), stage III (HCC) 02/27/2023   (HFpEF) heart failure with preserved ejection fraction (HCC) 02/27/2023   Anemia of chronic kidney failure, stage 3 (moderate) (HCC) 09/24/2022   Gross hematuria 09/22/2022   Hydronephrosis of right kidney 09/20/2022   Metabolic acidosis 09/20/2022   Hyperkalemia 09/19/2022   Mass of urinary bladder 09/19/2022   Sepsis due to gram-negative UTI (HCC) 03/20/2022   Type 2 diabetes mellitus without complications (HCC) 03/20/2022   Dyslipidemia 03/20/2022   Complicated UTI (urinary tract infection) 02/22/2022   Acute renal failure superimposed on stage 3b chronic kidney disease (HCC) 02/22/2022   Acute on chronic diastolic CHF (congestive heart failure) (HCC) 02/22/2022   Obesity with body mass  index of 30.0-39.9 02/22/2022   Cellulitis of left thigh 02/22/2022   Essential hypertension 01/13/2022   Seizure prophylaxis 01/13/2022   Acute pyelonephritis 01/05/2022   Fever 12/16/2021   Hypotension 10/09/2021   Abdominal distension 10/08/2021   AKI (acute kidney injury) (HCC) 10/08/2021   Sacral decubitus ulcer 10/07/2021   MSSA bacteremia 10/07/2021   Severe sepsis (HCC) 10/06/2021   Sepsis secondary to catheter associated UTI (HCC) 07/28/2021   COVID-19 virus infection 07/28/2021   Chronic indwelling Foley catheter 06/22/2021   Colostomy in place [facility]) 06/22/2021   TBI (traumatic brain injury) (HCC)    Healed ulcer of foot on examination 04/14/2021   Acute UTI 02/23/2021   Osteomyelitis of vertebra, sacral and sacrococcygeal region (HCC) 10/18/2020   Hyponatremia 07/07/2020   Encephalopathy 07/07/2020   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) 07/07/2020   Anemia of chronic disease 07/07/2020   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) 06/01/2020   Sacral osteomyelitis (HCC) 04/13/2020   Urinary retention 03/19/2020   History of DVT (deep vein thrombosis) 03/19/2020   Pressure injury of sacral region, stage 4 (HCC) 03/19/2020   Severe sepsis with septic shock (HCC) 03/18/2020   Obesity, Class III, BMI 40-49.9 (morbid obesity) (HCC) 03/18/2020   Cellulitis 03/18/2020   Acute cystitis without hematuria    Stage 3b chronic kidney disease (CKD) (HCC) - baseline SCr 1.8-1.9 01/20/2020   Decubitus ulcers 01/17/2020   Sepsis (HCC) 01/16/2020   Cellulitis of sacral region 01/16/2020   Acute kidney injury superimposed on chronic kidney disease (HCC) 01/16/2020   Acute metabolic encephalopathy 01/16/2020   Bipolar disorder, in full remission, most recent episode  mixed (HCC) 12/29/2019   High risk medication use 10/25/2019   Noncompliance with treatment plan 10/25/2019   Bipolar I disorder, most recent episode mixed (HCC)  01/07/2019   GAD (generalized anxiety disorder) 01/07/2019   Insomnia due to medical condition 01/07/2019   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) 03/06/2016   Bipolar disorder (HCC) 01/03/2016   Lithium toxicity 01/02/2016   Chronic anticoagulation - on Eliquis 07/06/2014   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/06/2014   Hyperlipidemia 07/06/2014   Leg swelling 07/06/2014   Obesity, Class II, BMI 35-39.9, with comorbidity 07/06/2014   On medication for venous thromboembolism 07/06/2014   Sleep apnea 07/06/2014   Type II diabetes mellitus with renal manifestations (HCC) 07/06/2014   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   Venous thromboembolism (VTE) 07/06/2014    Orientation RESPIRATION BLADDER Height & Weight     Self, Time, Situation, Place  Normal Indwelling catheter Weight: 136.1 kg Height:  5\' 6"  (167.6 cm)  BEHAVIORAL SYMPTOMS/MOOD NEUROLOGICAL BOWEL NUTRITION STATUS      Colostomy Diet  AMBULATORY STATUS COMMUNICATION OF NEEDS Skin   Extensive Assist Verbally Skin abrasions, Other (Comment) (wound bilateral buttocks and left heel)                       Personal Care Assistance Level of Assistance  Bathing, Dressing Bathing Assistance: Limited assistance   Dressing Assistance: Limited assistance     Functional Limitations Info  Sight, Hearing, Speech Sight Info: Adequate Hearing Info: Adequate Speech Info: Adequate    SPECIAL CARE FACTORS FREQUENCY  PT (By licensed PT), OT (By licensed OT)     PT Frequency: 5x week OT Frequency: 5x week            Contractures Contractures Info: Not present    Additional Factors Info  Code Status, Allergies Code Status Info: Full Allergies Info: None           Current Medications (04/15/2023):  This is the current hospital active medication list Current Facility-Administered Medications  Medication Dose Route Frequency Provider Last Rate  Last Admin   acetaminophen (TYLENOL) tablet 650 mg  650 mg Oral Q6H PRN Andris Baumann, MD       Or   acetaminophen (TYLENOL) suppository 650 mg  650 mg Rectal Q6H PRN Andris Baumann, MD       amLODipine (NORVASC) tablet 5 mg  5 mg Oral Daily Lindajo Royal V, MD   5 mg at 04/14/23 1020   And   irbesartan (AVAPRO) tablet 150 mg  150 mg Oral Daily Lindajo Royal V, MD   150 mg at 04/14/23 1020   Ampicillin-Sulbactam (UNASYN) 3 g in sodium chloride 0.9 % 100 mL IVPB  3 g Intravenous Q8H Jamelle Rushing L, MD 200 mL/hr at 04/15/23 0824 3 g at 04/15/23 0824   apixaban (ELIQUIS) tablet 5 mg  5 mg Oral BID Andris Baumann, MD   5 mg at 04/14/23 2125   Chlorhexidine Gluconate Cloth 2 % PADS 6 each  6 each Topical Daily [name], MD   6 each at 04/14/23 1600   HYDROcodone-acetaminophen (NORCO/VICODIN) 5-325 MG per tablet 1-2 tablet  1-2 tablet Oral Q4H PRN Andris Baumann, MD       memantine [facility]) tablet 10 mg  10 mg Oral BID Andris Baumann, MD   10 mg at 04/14/23 2125   ondansetron (ZOFRAN) tablet 4 mg  4 mg Oral Q6H  PRN Andris Baumann, MD       Or   ondansetron [facility]) injection 4 mg  4 mg Intravenous Q6H PRN Andris Baumann, MD       Oxcarbazepine (TRILEPTAL) tablet 300 mg  300 mg Oral BID Lindajo Royal V, MD   300 mg at 04/14/23 2127   QUEtiapine (SEROQUEL) tablet 25 mg  25 mg Oral QHS Andris Baumann, MD   25 mg at 04/14/23 2126   rosuvastatin (CRESTOR) tablet 20 mg  20 mg Oral Daily Lindajo Royal V, MD   20 mg at 04/14/23 1020   sodium bicarbonate tablet 650 mg  650 mg Oral Daily Andris Baumann, MD   650 mg at 04/14/23 1021     Discharge Medications: Please see discharge summary for a list of discharge medications.  Relevant Imaging Results:  Relevant Lab Results:   Additional Information SS# [id number]  Hetty Ely, RN

## 2023-04-16 DIAGNOSIS — N390 Urinary tract infection, site not specified: Secondary | ICD-10-CM | POA: Diagnosis not present

## 2023-04-16 DIAGNOSIS — A419 Sepsis, unspecified organism: Secondary | ICD-10-CM | POA: Diagnosis not present

## 2023-04-16 LAB — CBC
HCT: 28.2 % — ABNORMAL LOW (ref 39.0–52.0)
Hemoglobin: 8.7 g/dL — ABNORMAL LOW (ref 13.0–17.0)
MCH: 26.9 pg (ref 26.0–34.0)
MCHC: 30.9 g/dL (ref 30.0–36.0)
MCV: 87 fL (ref 80.0–100.0)
Platelets: 229 10*3/uL (ref 150–400)
RBC: 3.24 MIL/uL — ABNORMAL LOW (ref 4.22–5.81)
RDW: 15.9 % — ABNORMAL HIGH (ref 11.5–15.5)
WBC: 5.2 10*3/uL (ref 4.0–10.5)
nRBC: 0 % (ref 0.0–0.2)

## 2023-04-16 LAB — URINE CULTURE: Culture: >=100000 — AB

## 2023-04-16 LAB — BASIC METABOLIC PANEL
Anion gap: 5 (ref 5–15)
BUN: 42 mg/dL — ABNORMAL HIGH (ref 8–23)
CO2: 20 mmol/L — ABNORMAL LOW (ref 22–32)
Calcium: 8 mg/dL — ABNORMAL LOW (ref 8.9–10.3)
Chloride: 108 mmol/L (ref 98–111)
Creatinine, Ser: 2.09 mg/dL — ABNORMAL HIGH (ref 0.61–1.24)
GFR, Estimated: 34 mL/min — ABNORMAL LOW (ref >60)
Glucose, Bld: 147 mg/dL — ABNORMAL HIGH (ref 70–99)
Potassium: 4.6 mmol/L (ref 3.5–5.1)
Sodium: 133 mmol/L — ABNORMAL LOW (ref 135–145)

## 2023-04-16 LAB — CULTURE, BLOOD (ROUTINE X 2): Culture: NO GROWTH

## 2023-04-16 NOTE — TOC Progression Note (Signed)
Transition of Care [facility]) - Progression Note    Patient Details  Name: [name] MRN: [id number] Date of Birth: February 04, 1956  [facility]) CM/SW Contact  Garret Reddish, RN Phone Number: 04/16/2023, 6:58 PM  Clinical Narrative:    I have spoken with patient and given bed offer.  Patient only has one bed.  He has accepted [facility] bed offer.    I have attempted to start SNF authorization.  However, the [employer] portal would not pull patient's information up.  Called [employer] and their team was not able to understand why I was not able to pull patient up in the [employer] portal.  [employer] team went ahead and started SNF authorization.  Reference number is [id number].  I have faxed H and P, PT/OT notes notes, Provider notes, and wound care notes to to [facility] at [phone].    TOC will continue to follow for discharge planning.     Expected Discharge Plan: Skilled Nursing Facility Barriers to Discharge: Continued Medical Work up  Expected Discharge Plan and Services     Post Acute Care Choice: Skilled Nursing Facility Living arrangements for the past 2 months: Single Family Home                 DME Arranged: N/A DME Agency: NA       HH Arranged: NA HH Agency: NA         Social Determinants of Health (SDOH) Interventions SDOH Screenings   Food Insecurity: No Food Insecurity (02/27/2023)  Housing: Low Risk  (02/27/2023)  Transportation Needs: No Transportation Needs (02/27/2023)  Utilities: Not At Risk (02/27/2023)  Depression (PHQ2-9): Medium Risk (12/04/2022)  Financial Resource Strain: Low Risk  (12/31/2020)   Received from [facility]  Physical Activity: Inactive (09/09/2017)  Social Connections: Unknown (09/09/2017)  Stress: No Stress Concern Present (09/09/2017)  Tobacco Use: Low Risk  (04/11/2023)    Readmission Risk Interventions    03/05/2023   12:55 PM 02/28/2023   11:21 AM 03/23/2022   12:09 PM  Readmission Risk  Prevention Plan  Transportation Screening  Complete Complete  Medication Review [facility])  Complete Complete  PCP or Specialist appointment within 3-5 days of discharge  Complete Complete  HRI or Home Care Consult  Complete Complete  SW Recovery Care/Counseling Consult  Complete Complete  Palliative Care Screening  Not Applicable Not Applicable  Skilled Nursing Facility Complete Not Applicable Not Applicable

## 2023-04-16 NOTE — Progress Notes (Signed)
PROGRESS NOTE  [name]    DOB: April 20, 1956, 67 y.o.  QMV:[id number]    Code Status: Full Code   DOA: 04/11/2023   LOS: 4  Brief hospital course  [name] is a 67 y.o. 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  They presented to the ED for evaluation of fever and weakness starting on the day of arrival  ED course: Tmax 102.9, tachycardic to 115 on arrival and tachypneic to 22, O2 sats high 90s on room air. Labs: WBC normal at 8200 with normal lactic acid 1.8 and procalcitonin 0.3.Hemoglobin baseline at 9.9. CMP notable for creatinine 2.64 up from baseline of 2 a month prior, bicarb 19.  Urinalysis with large leukocyte esterase and rare bacteria. EKG: sinus tachycardia at 107 with no acute ST-T wave changes. Chest x-ray with no active disease.  They were initially treated with cefepime for sepsis of suspected urinary source as well as IV fluids.   Patient was admitted to medicine service for further workup and management of urosepsis as outlined in detail below.  10/14- UxCx sensitivities returned with Enterococcus faecalis and another GNR so unfortunately, was not being adequately covered with cefepime but he remained in stable condition. Changed to unasyn.  PT evaluated and recommended SNF at discharge. TOC engaged for placement  04/16/23 -remains stable on unasyn.  Second infectious agent identified now as ACINETOBACTER BAUMANNII.  Remain on unasyn and follow for sensitivities.   Awaiting SNF placement.  Assessment & Plan  Principal Problem:   Sepsis secondary to catheter associated UTI [facility]) Active  Problems:   Acute renal failure superimposed on stage 3b chronic kidney disease (HCC)   Colostomy care (HCC)   Pressure injury of skin   Enterococcus faecalis infection  Sepsis secondary to catheter associated UTI (HCC)- urine culture positive for Enterococcus faecalis and less significantly acinetobacter baumannii.  Chronic Foley catheter- exchanged on admission prior to culture Sepsis criteria include fever, tachycardia and tachypnea.  WBC 8.2 and lactic acid 1.8 and procalcitonin 0.3. sepsis criteria have resolved and he is stable ORA - Continue unasyn - on day 3 - Follow cultures, urine and blood - blood NGTD - urine- positive for e faecalis and acinetobacter baumannii.   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) Increased nursing assistance for communication of needs Continue memantine, quetiapine and Trileptal - PT/OT evaluation     Obesity, Class III, BMI 40-49.9 (morbid obesity) (HCC) Complicating factor to overall prognosis and care   Decubitus ulcers- POA History of sacral and heel ulcers Wound care consult for decubitus care and recommendations Decubitus precautions   Type II diabetes mellitus with renal manifestations (HCC) Sliding scale insulin coverage   Hypertension - continue home amlodipine   Chronic anticoagulation - on Eliquis, h/o DVT - continue home Eliquis  Body  mass index is 48.42 kg/m.  VTE ppx:  apixaban (ELIQUIS) tablet 5 mg   Diet:     Diet   Diet Carb Modified Fluid consistency: Thin; Room service appropriate?  Yes; Fluid restriction: 1500 mL Fluid   Consultants: None   Subjective 04/16/23    Pt seen awake resting in bed. He reports feeling okay, no acute complaints.    Objective   Vitals:   04/15/23 0756 04/15/23 1446 04/15/23 2338 04/16/23 0734  BP: 128/76 110/65 121/67 (!) 157/75  Pulse: 66 (!) 59 (!) 56 (!) 50  Resp: 16 17 18 17   Temp: 97.6 F (36.4 C) (!) 97.5 F (36.4 C) 97.6 F (36.4 C) (!) 97.5 F (36.4 C)  TempSrc:   Oral   SpO2: 100% 100% 93% 100%  Weight:      Height:        Intake/Output Summary (Last 24 hours) at 04/16/2023 1447 Last data filed at 04/16/2023 0913 Gross per 24 hour  Intake 100 ml  Output 1200 ml  Net -1100 ml   Filed Weights   04/11/23 2204  Weight: 136.1 kg    Physical Exam:  General: awake, alert, NAD, obese HEENT:  MMM, hearing grossly normal Respiratory: normal respiratory effort.  CTAB no wheezes or rhonchi heard Cardiovascular: RRR, brisk cap refill Gastrointestinal: soft, NT, ND. Ostomy in place and filled with soft stool Nervous: A&O x3. normal speech Extremities: moves all equally, BUE edema, no LE edema, normal tone Skin: dry, intact, normal temperature Psychiatry: normal mood, congruent affect  Labs   I have personally reviewed the following labs and imaging studies CBC    Component Value Date/Time   WBC 5.2 04/16/2023 0255   RBC 3.24 (L) 04/16/2023 0255   HGB 8.7 (L) 04/16/2023 0255   HCT 28.2 (L) 04/16/2023 0255   PLT 229 04/16/2023 0255   MCV 87.0 04/16/2023 0255   MCH 26.9 04/16/2023 0255   MCHC 30.9 04/16/2023 0255   RDW 15.9 (H) 04/16/2023 0255   LYMPHSABS 0.9 04/11/2023 2206   MONOABS 0.7 04/11/2023 2206   EOSABS 0.1 04/11/2023 2206   BASOSABS 0.0 04/11/2023 2206      Latest Ref Rng & Units 04/16/2023    2:55 AM 04/15/2023    6:34 AM 04/14/2023    4:07 AM  BMP  Glucose 70 - 99 mg/dL 161  096  045   BUN 8 - 23 mg/dL 42  41  40   Creatinine 0.61 - 1.24 mg/dL 4.09  8.11  9.14   Sodium 135 - 145 mmol/L  133  133  134   Potassium 3.5 - 5.1 mmol/L 4.6  4.6  4.7   Chloride 98 - 111 mmol/L 108  107  105   CO2 22 - 32 mmol/L 20  18  20    Calcium 8.9 - 10.3 mg/dL 8.0  8.2  8.5     No results found.  Disposition Plan & Communication  Patient status: Inpatient  Admitted From: Home Planned disposition location: SNF Anticipated discharge date: SNF placement pending, medically stable.  Family Communication: none at bedside  wife is also hospitalized at another facility    Author: Pennie Banter, DO [facility] 04/16/2023, 2:47 PM   Available by Epic secure chat 7AM-7PM. If 7PM-7AM, please contact night-coverage.  TRH contact information found on [email or web address].

## 2023-04-16 NOTE — Plan of Care (Signed)
  Problem: Clinical Measurements: Goal: Signs and symptoms of infection will decrease Outcome: Progressing   Problem: Respiratory: Goal: Ability to maintain adequate ventilation will improve Outcome: Progressing   Problem: Education: Goal: Knowledge of General Education information will improve Description: Including pain rating scale, medication(s)/side effects and non-pharmacologic comfort measures Outcome: Progressing   Problem: Activity: Goal: Risk for activity intolerance will decrease Outcome: Progressing   Problem: Nutrition: Goal: Adequate nutrition will be maintained Outcome: Progressing   Problem: Coping: Goal: Level of anxiety will decrease Outcome: Progressing

## 2023-04-16 NOTE — Progress Notes (Signed)
Physical Therapy Treatment Patient Details Name: [name] MRN: [id number] DOB: April 25, 1956 Today's Date: 04/16/2023   History of Present Illness Pt is a 67 y.o. male presenting to hospital 04/11/23 with c/o fevers and weakness.  Pt admitted with sepsis secondary to catheter associated UTI, acute renal failure superimposed on stage 3b CKD, hyponatremia, decubitus ulcers (sacral and heel ulcers).  PMH includes chronic indwelling foley catheter, htn, DM, CKD, CHF, TBI, dementia, neurogenic bladder, bipolar disorder, bladder mass, colostomy, back sx.    PT Comments  Pt pleasant and engaged with PT but needed repeated and excessive cuing for even basic tasks.  Pt showed good effort with standing, but ultimately could not get hips forward/shoulder back and was excessively reliant on UEs and ultimately unable to attain upright and into standing.  He has limited knee and ankle ROM that disallowed appropriate positioning for transition to standing, though with UE use and modA he did manage to get upright and maintain standing for >1 minute, though again with hips back/forward flexed posture. Pt will benefit from further PT to address functional limitations, continue with POC.     If plan is discharge home, recommend the following: Two people to help with walking and/or transfers   Can travel by private vehicle     No  Equipment Recommendations  Rafael Capi lift;Hospital bed    Recommendations for Other Services       Precautions / Restrictions Precautions Precautions: Fall Precaution Comments: LLQ Ostomy; prevalon boots Restrictions Weight Bearing Restrictions: No     Mobility  Bed Mobility               General bed mobility comments: Deferred (pt sitting in recliner beginning/end of session)    Transfers Overall transfer level: Needs assistance Equipment used: Rolling walker (2 wheels) Transfers: Sit to/from Stand Sit to Stand: Max assist           General transfer  comment: Pt struggled to flex knees enough to get them underneath his center of gravity, also needed repeated cuing to place UEs appropriately and ultimately his inability to shift weight forward necessitated heavy assist to actually attain upright    Ambulation/Gait               General Gait Details: despite excessive assist he was unable to get hips forward and shoulders back to make weight shifts/shuffling/etc possible   Stairs             Wheelchair Mobility     Tilt Bed    Modified Rankin (Stroke Patients Only)       Balance Overall balance assessment: Needs assistance Sitting-balance support: No upper extremity supported, Feet supported Sitting balance-Leahy Scale: Fair Sitting balance - Comments: tending to lean back but with cuing for forward bend/shift he was able to maintain sitting balance w/o back support   Standing balance support: Bilateral upper extremity supported, Reliant on assistive device for balance Standing balance-Leahy Scale: Poor Standing balance comment: able to maintain static standing with poor balance but highly reliant on the walker and not at all close to actual, upright posture                            Cognition Arousal: Alert Behavior During Therapy: Flat affect Overall Cognitive Status: No family/caregiver present to determine baseline cognitive functioning  General Comments: A&O x4; increased time to respond at times; some generalized confusion noted        Exercises General Exercises - Lower Extremity Ankle Circles/Pumps: PROM, 10 reps, Both Long Arc Quad: AROM, 10 reps (available range b/l knees grossly 5-85) Heel Slides: 10 reps, AROM    General Comments General comments (skin integrity, edema, etc.): Pt with limited ankle and knee mobility, reports he has not done any real walking in months      Pertinent Vitals/Pain Pain Assessment Pain Assessment:  No/denies pain    Home Living                          Prior Function            PT Goals (current goals can now be found in the care plan section) Progress towards PT goals: Progressing toward goals    Frequency    Min 1X/week      PT Plan      Co-evaluation              AM-PAC PT "6 Clicks" Mobility   Outcome Measure  Help needed turning from your back to your side while in a flat bed without using bedrails?: A Little Help needed moving from lying on your back to sitting on the side of a flat bed without using bedrails?: A Lot Help needed moving to and from a bed to a chair (including a wheelchair)?: A Lot Help needed standing up from a chair using your arms (e.g., wheelchair or bedside chair)?: A Lot Help needed to walk in hospital room?: Total Help needed climbing 3-5 steps with a railing? : Total 6 Click Score: 11    End of Session Equipment Utilized During Treatment: Gait belt (above ostomy) Activity Tolerance: Patient limited by fatigue Patient left: in chair;with call bell/phone within reach;with chair alarm set Nurse Communication: Mobility status;Precautions PT Visit Diagnosis: Other abnormalities of gait and mobility (R26.89);Muscle weakness (generalized) (M62.81)     Time: 1610-9604 PT Time Calculation (min) (ACUTE ONLY): 25 min  Charges:    $Therapeutic Exercise: 8-22 mins $Therapeutic Activity: 8-22 mins PT General Charges $$ ACUTE PT VISIT: 1 Visit                     Malachi Pro, DPT 04/16/2023, 3:15 PM

## 2023-04-17 DIAGNOSIS — A419 Sepsis, unspecified organism: Secondary | ICD-10-CM | POA: Diagnosis not present

## 2023-04-17 DIAGNOSIS — N390 Urinary tract infection, site not specified: Secondary | ICD-10-CM | POA: Diagnosis not present

## 2023-04-17 LAB — CULTURE, BLOOD (ROUTINE X 2)
Culture: NO GROWTH
Special Requests: ADEQUATE

## 2023-04-17 NOTE — Plan of Care (Signed)
  Problem: Education: Goal: Knowledge of General Education information will improve Description: Including pain rating scale, medication(s)/side effects and non-pharmacologic comfort measures Outcome: Progressing   Problem: Pain Managment: Goal: General experience of comfort will improve Outcome: Progressing   

## 2023-04-17 NOTE — TOC Progression Note (Signed)
Transition of Care [facility]) - Progression Note    Patient Details  Name: [name] MRN: [id number] Date of Birth: September 04, 1955  [facility]) CM/SW Contact  Hetty Ely, RN Phone Number: 04/17/2023, 3:44 PM  Clinical Narrative:  SNF Authorization pending checked at 9:08 am and 3:44 pm. CM will continue to track.     Expected Discharge Plan: Skilled Nursing Facility Barriers to Discharge: Continued Medical Work up  Expected Discharge Plan and Services     Post Acute Care Choice: Skilled Nursing Facility Living arrangements for the past 2 months: Single Family Home                 DME Arranged: N/A DME Agency: NA       HH Arranged: NA HH Agency: NA         Social Determinants of Health (SDOH) Interventions SDOH Screenings   Food Insecurity: No Food Insecurity (02/27/2023)  Housing: Low Risk  (02/27/2023)  Transportation Needs: No Transportation Needs (02/27/2023)  Utilities: Not At Risk (02/27/2023)  Depression (PHQ2-9): Medium Risk (12/04/2022)  Financial Resource Strain: Low Risk  (12/31/2020)   Received from [facility], [facility]  Physical Activity: Inactive (09/09/2017)  Social Connections: Unknown (09/09/2017)  Stress: No Stress Concern Present (09/09/2017)  Tobacco Use: Low Risk  (04/11/2023)    Readmission Risk Interventions    03/05/2023   12:55 PM 02/28/2023   11:21 AM 03/23/2022   12:09 PM  Readmission Risk Prevention Plan  Transportation Screening  Complete Complete  Medication Review [facility])  Complete Complete  PCP or Specialist appointment within 3-5 days of discharge  Complete Complete  HRI or Home Care Consult  Complete Complete  SW Recovery Care/Counseling Consult  Complete Complete  Palliative Care Screening  Not Applicable Not Applicable  Skilled Nursing Facility Complete Not Applicable Not Applicable

## 2023-04-17 NOTE — Progress Notes (Signed)
PROGRESS NOTE  [name]    DOB: [birth year], 67 y.o.  ZOX:[id number]    Code Status: Full Code   DOA: 04/11/2023   LOS: 5  Brief hospital course  [name] is a 67 y.o. 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  They presented to the ED for evaluation of fever and weakness starting on the day of arrival  ED course: Tmax 102.9, tachycardic to 115 on arrival and tachypneic to 22, O2 sats high 90s on room air. Labs: WBC normal at 8200 with normal lactic acid 1.8 and procalcitonin 0.3.Hemoglobin baseline at 9.9. CMP notable for creatinine 2.64 up from baseline of 2 a month prior, bicarb 19.  Urinalysis with large leukocyte esterase and rare bacteria. EKG: sinus tachycardia at 107 with no acute ST-T wave changes. Chest x-ray with no active disease.  They were initially treated with cefepime for sepsis of suspected urinary source as well as IV fluids.   Patient was admitted to medicine service for further workup and management of urosepsis as outlined in detail below.  10/14- UxCx sensitivities returned with Enterococcus faecalis and another GNR so unfortunately, was not being adequately covered with cefepime but he remained in stable condition. Changed to unasyn.  PT evaluated and recommended SNF at discharge. TOC engaged for placement  04/17/23 -remains stable on unasyn.  Second infectious agent identified now as ACINETOBACTER BAUMANNII.  Remain on unasyn and follow for sensitivities.   Awaiting SNF placement.  Assessment & Plan  Principal Problem:   Sepsis secondary to catheter associated UTI [facility]) Active  Problems:   Acute renal failure superimposed on stage 3b chronic kidney disease (HCC)   Colostomy care (HCC)   Pressure injury of skin   Enterococcus faecalis infection  Sepsis secondary to catheter associated UTI (HCC)- urine culture positive for Enterococcus faecalis (>100k CFU) and less significantly acinetobacter baumannii (felt less likely pathogenic, 20k CFU).  Chronic Foley catheter- exchanged on admission prior to culture Sepsis criteria include fever, tachycardia and tachypnea.  WBC 8.2 and lactic acid 1.8 and procalcitonin 0.3. 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) Increased nursing assistance for communication of needs Continue memantine, quetiapine and Trileptal - PT/OT evaluation     Obesity, Class III, BMI 40-49.9 (morbid obesity) (HCC) Complicating factor to overall prognosis and care   Decubitus ulcers- POA History of sacral and heel ulcers Wound care consult for decubitus care and recommendations Decubitus precautions   Type II diabetes mellitus with renal manifestations (HCC) Sliding scale insulin coverage   Hypertension - continue home amlodipine   Chronic anticoagulation -  on Eliquis, h/o DVT - continue home Eliquis  Body mass index is 48.42 kg/m.  VTE ppx:  apixaban (ELIQUIS) tablet 5 mg   Diet:     Diet   Diet Carb Modified Fluid  consistency: Thin; Room service appropriate? Yes; Fluid restriction: 1500 mL Fluid   Consultants: None   Subjective 04/17/23    Pt seen sleeping this AM, woke briefly to voice.  He denies acute copmlaints or needs for me.  No acute events reported.  Awaiting SNF.   Objective   Vitals:   04/16/23 0734 04/16/23 1457 04/16/23 2313 04/17/23 0744  BP: (!) 157/75 (!) 112/48 118/82 137/86  Pulse: (!) 50 63 60 61  Resp: 17 17 16 17   Temp: (!) 97.5 F (36.4 C) 97.7 F (36.5 C) 98.2 F (36.8 C)   TempSrc:      SpO2: 100% 100% 100% 100%  Weight:      Height:        Intake/Output Summary (Last 24 hours) at 04/17/2023 1313 Last data filed at 04/16/2023 1857 Gross per 24 hour  Intake 578.61 ml  Output 550 ml  Net 28.61 ml   Filed Weights   04/11/23 2204  Weight: 136.1 kg    Physical Exam:  General exam: sleeping comfortably, woke to voice briefly, no acute distress HEENT: moist mucus membranes, hearing grossly normal  Respiratory system: CTAB, no wheezes, rales or rhonchi, normal respiratory effort. On room air. Cardiovascular system: normal S1/S2, RRR,.   Gastrointestinal system: soft, NT, ND, ostomy present with liquid stool in bag. Central nervous system:  no gross focal neurologic deficits, normal speech Skin: dry, intact, normal temperature Psychiatry: normal mood, flat affect  Labs   I have personally reviewed the following labs and imaging studies CBC    Component Value Date/Time   WBC 5.2 04/16/2023 0255   RBC 3.24 (L) 04/16/2023 0255   HGB 8.7 (L) 04/16/2023 0255   HCT 28.2 (L) 04/16/2023 0255   PLT 229 04/16/2023 0255   MCV 87.0 04/16/2023 0255   MCH 26.9 04/16/2023 0255   MCHC 30.9 04/16/2023 0255   RDW 15.9 (H) 04/16/2023 0255   LYMPHSABS 0.9 04/11/2023 2206   MONOABS 0.7 04/11/2023 2206   EOSABS 0.1 04/11/2023 2206   BASOSABS 0.0 04/11/2023 2206      Latest Ref Rng & Units 04/16/2023    2:55 AM 04/15/2023    6:34 AM 04/14/2023    4:07 AM  BMP   Glucose 70 - 99 mg/dL 161  096  045   BUN 8 - 23 mg/dL 42  41  40   Creatinine 0.61 - 1.24 mg/dL 4.09  8.11  9.14   Sodium 135 - 145 mmol/L 133  133  134   Potassium 3.5 - 5.1 mmol/L 4.6  4.6  4.7   Chloride 98 - 111 mmol/L 108  107  105   CO2 22 - 32 mmol/L 20  18  20    Calcium 8.9 - 10.3 mg/dL 8.0  8.2  8.5     No results found.  Disposition Plan & Communication  Patient status: Inpatient  Admitted From: Home Planned disposition location: SNF Anticipated discharge date: SNF placement pending, medically stable.  Family Communication: none at bedside  wife is reportedly hospitalized at another facility    Author: Pennie Banter, DO [facility] 04/17/2023, 1:13 PM   Available by Epic secure chat 7AM-7PM. If 7PM-7AM, please contact night-coverage.  TRH contact information found on [email or web address].

## 2023-04-18 DIAGNOSIS — N390 Urinary tract infection, site not specified: Secondary | ICD-10-CM | POA: Diagnosis not present

## 2023-04-18 DIAGNOSIS — A419 Sepsis, unspecified organism: Secondary | ICD-10-CM | POA: Diagnosis not present

## 2023-04-18 MED ORDER — SODIUM CHLORIDE 0.9 % IV SOLN
3.0000 g | Freq: Three times a day (TID) | INTRAVENOUS | Status: DC
  Administered 2023-04-18 – 2023-04-19 (×3): 3 g via INTRAVENOUS
  Filled 2023-04-18 (×4): qty 8

## 2023-04-18 NOTE — Plan of Care (Signed)
CHL Tonsillectomy/Adenoidectomy, Postoperative PEDS care plan entered in error.

## 2023-04-18 NOTE — TOC Progression Note (Signed)
Transition of Care [facility]) - Progression Note    Patient Details  Name: [name] MRN: [id number] Date of Birth: 06-25-1956  [facility]) CM/SW Contact  Garret Reddish, RN Phone Number: 04/18/2023, 4:14 PM  Clinical Narrative:     I have made Tanya, Admission Coordinator for [facility] that patient would like to discharge home.     Expected Discharge Plan: Skilled Nursing Facility Barriers to Discharge: Continued Medical Work up  Expected Discharge Plan and Services     Post Acute Care Choice: Skilled Nursing Facility Living arrangements for the past 2 months: Single Family Home                 DME Arranged: N/A DME Agency: NA       HH Arranged: NA HH Agency: NA         Social Determinants of Health (SDOH) Interventions SDOH Screenings   Food Insecurity: No Food Insecurity (02/27/2023)  Housing: Low Risk  (02/27/2023)  Transportation Needs: No Transportation Needs (02/27/2023)  Utilities: Not At Risk (02/27/2023)  Depression (PHQ2-9): Medium Risk (12/04/2022)  Financial Resource Strain: Low Risk  (12/31/2020)   [facility]  Physical Activity: Inactive (09/09/2017)  Social Connections: Unknown (09/09/2017)  Stress: No Stress Concern Present (09/09/2017)  Tobacco Use: Low Risk  (04/11/2023)    Readmission Risk Interventions    03/05/2023   12:55 PM 02/28/2023   11:21 AM 03/23/2022   12:09 PM  Readmission Risk Prevention Plan  Transportation Screening  Complete Complete  Medication Review [employer])  Complete Complete  PCP or Specialist appointment within 3-5 days of discharge  Complete Complete  HRI or Home Care Consult  Complete Complete  SW Recovery Care/Counseling Consult  Complete Complete  Palliative Care Screening  Not Applicable Not Applicable  Skilled Nursing Facility Complete Not Applicable Not Applicable

## 2023-04-18 NOTE — TOC Progression Note (Signed)
Transition of Care [facility]) - Progression Note    Patient Details  Name: [name] MRN: [id number] Date of Birth: 1956/05/21  Transition of Care [facility]) CM/SW Contact  Garret Reddish, RN Phone Number: 04/18/2023, 3:53 PM  Clinical Narrative:     I have spoken with Mrs. Yero.  She informs me that she wants to take her husband home.  She was recently discharged from the hospital but feels she has enough support at home to manage his care.  She informs me that she has been caring for patient for 3 years and would like to take patient home.  Mrs. Pacey reports that patient is active with [facility].  I have confirmed with Elnita Maxwell with [facility] that is patient is active for RN, PT, and OT.   Mrs. Stratmann reports that patient has a hoyer lift, BSC, rolling walker, and wheelchair at home. Patient has a ramp at home.  Mrs. Crothers has requested a shower chair.  Mrs. Callery reports that she would like to take patient home on tomorrow.  She reports that she will transport patient home tomorrow.    I have informed Elnita Maxwell with [facility] that patient will be a discharge for tomorrow.  I have informed Cheryl home health orders will be for RN, PT, and OT.  I have asked John with [employer] to provide a shower chair for home use.    TOC will continue to follow for discharge planning.       Expected Discharge Plan: Skilled Nursing Facility Barriers to Discharge: Continued Medical Work up  Expected Discharge Plan and Services     Post Acute Care Choice: Skilled Nursing Facility Living arrangements for the past 2 months: Single Family Home                 DME Arranged: N/A DME Agency: NA       HH Arranged: NA HH Agency: NA         Social Determinants of Health (SDOH) Interventions SDOH Screenings   Food Insecurity: No Food Insecurity (02/27/2023)  Housing: Low Risk  (02/27/2023)  Transportation Needs: No Transportation Needs  (02/27/2023)  Utilities: Not At Risk (02/27/2023)  Depression (PHQ2-9): Medium Risk (12/04/2022)  Financial Resource Strain: Low Risk  (12/31/2020)   Received [facility]  Physical Activity: Inactive (09/09/2017)  Social Connections: Unknown (09/09/2017)  Stress: No Stress Concern Present (09/09/2017)  Tobacco Use: Low Risk  (04/11/2023)    Readmission Risk Interventions    03/05/2023   12:55 PM 02/28/2023   11:21 AM 03/23/2022   12:09 PM  Readmission Risk Prevention Plan  Transportation Screening  Complete Complete  Medication Review Oceanographer)  Complete Complete  PCP or Specialist appointment within 3-5 days of discharge  Complete Complete  HRI or Home Care Consult  Complete Complete  SW Recovery Care/Counseling Consult  Complete Complete  Palliative Care Screening  Not Applicable Not Applicable  Skilled Nursing Facility Complete Not Applicable Not Applicable

## 2023-04-18 NOTE — Progress Notes (Signed)
PROGRESS NOTE  [name]    DOB: 06-06-1956, 67 y.o.  WUJ:[id number]    Code Status: Full Code   DOA: 04/11/2023   LOS: 6  Brief hospital course  [name] is a 67 y.o. 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  They presented to the ED for evaluation of fever and weakness starting on the day of arrival  ED course: Tmax 102.9, tachycardic to 115 on arrival and tachypneic to 22, O2 sats high 90s on room air. Labs: WBC normal at 8200 with normal lactic acid 1.8 and procalcitonin 0.3.Hemoglobin baseline at 9.9. CMP notable for creatinine 2.64 up from baseline of 2 a month prior, bicarb 19.  Urinalysis with large leukocyte esterase and rare bacteria. EKG: sinus tachycardia at 107 with no acute ST-T wave changes. Chest x-ray with no active disease.  They were initially treated with cefepime for sepsis of suspected urinary source as well as IV fluids.   Patient was admitted to medicine service for further workup and management of urosepsis as outlined in detail below.  10/14- UxCx sensitivities returned with Enterococcus faecalis and another GNR so unfortunately, was not being adequately covered with cefepime but he remained in stable condition. Changed to unasyn.  PT evaluated and recommended SNF at discharge. TOC engaged for placement  04/18/23 -remains stable on unasyn.  Second infectious agent identified now as ACINETOBACTER BAUMANNII.  Remain on unasyn and follow for sensitivities.   Awaiting SNF placement.  Assessment & Plan  Principal Problem:   Sepsis secondary to catheter associated UTI [facility]) Active  Problems:   Acute renal failure superimposed on stage 3b chronic kidney disease (HCC)   Colostomy care (HCC)   Pressure injury of skin   Enterococcus faecalis infection  Sepsis secondary to catheter associated UTI (HCC)- urine culture positive for Enterococcus faecalis (>100k CFU) and less significantly acinetobacter baumannii (felt less likely pathogenic, 20k CFU).  Chronic Foley catheter- exchanged on admission prior to culture Sepsis criteria include fever, tachycardia and tachypnea.  WBC 8.2 and lactic acid 1.8 and procalcitonin 0.3. sepsis criteria have resolved and he is stable ORA - Continue unasyn - on day 5 - continue to complete 7 day course - can d/c on PO amoxicillin to complete course if d/c before completion - blood cx NGTD - follow to final  Acute renal failure superimposed on stage 3b chronic kidney disease (HCC)-   Cr was risin\g 2.3>2.5>2.2 yesterday >> to 2.09 on 10/16 - at baseline Baseline Cr ~2 - Monitor BMP's    Colostomy in place [facility]) Colostomy care   Anemia of chronic disease- hgb 10.0>>8.6>9.1>8.7 >8.7 stable No reports or evidence of bleeding - CBC am   Hyponatremia- stable at 956>213>086 - continue home daily salt tablets   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) Increased nursing assistance for communication of needs Continue memantine, quetiapine and Trileptal - PT/OT evaluation     Obesity, Class III, BMI 40-49.9 (morbid obesity) (HCC) Complicating factor to overall prognosis and care   Decubitus ulcers- POA History of sacral and heel ulcers Wound care consult for decubitus care and recommendations Decubitus precautions   Type II diabetes mellitus with renal manifestations (HCC) Sliding scale insulin coverage  Hypertension - continue home amlodipine   Chronic anticoagulation - on Eliquis, h/o DVT - continue home Eliquis  Body mass index is 48.42 kg/m.  VTE ppx:  apixaban (ELIQUIS)  tablet 5 mg   Diet:     Diet   Diet Carb Modified Fluid consistency: Thin; Room service appropriate? Yes; Fluid restriction: 1500 mL Fluid   Consultants: None   Subjective 04/18/23    Pt seen up in recliner this AM.  He denies any acute complaints.  States he's feeling okay.   Objective   Vitals:   04/17/23 0744 04/17/23 1417 04/18/23 0005 04/18/23 0805  BP: 137/86 (!) 117/53 (!) 111/51 (!) 160/80  Pulse: 61 (!) 53 (!) 51 65  Resp: 17 17 18 18   Temp:  97.6 F (36.4 C) 98.1 F (36.7 C) 97.6 F (36.4 C)  TempSrc:  Oral  Oral  SpO2: 100% 100% 100% 97%  Weight:      Height:        Intake/Output Summary (Last 24 hours) at 04/18/2023 1253 Last data filed at 04/18/2023 6578 Gross per 24 hour  Intake --  Output 1050 ml  Net -1050 ml   Filed Weights   04/11/23 2204  Weight: 136.1 kg    Physical Exam:  General exam: awake up in recliner, no acute distress HEENT: moist mucus membranes, hearing grossly normal  Respiratory system: CTAB, no wheezes, rales or rhonchi, normal respiratory effort. On room air. Cardiovascular system: normal S1/S2, RRR,.   Gastrointestinal system: soft, NT, ND, ostomy present with liquid stool in bag. Central nervous system:  no gross focal neurologic deficits, normal speech Skin: dry, intact, normal temperature Psychiatry: normal mood, flat affect  Labs   I have personally reviewed the following labs and imaging studies CBC    Component Value Date/Time   WBC 5.2 04/16/2023 0255   RBC 3.24 (L) 04/16/2023 0255   HGB 8.7 (L) 04/16/2023 0255   HCT 28.2 (L) 04/16/2023 0255   PLT 229 04/16/2023 0255   MCV 87.0 04/16/2023 0255   MCH 26.9 04/16/2023 0255   MCHC 30.9 04/16/2023 0255   RDW 15.9 (H) 04/16/2023 0255   LYMPHSABS 0.9 04/11/2023 2206   MONOABS 0.7 04/11/2023 2206   EOSABS 0.1 04/11/2023 2206   BASOSABS 0.0 04/11/2023 2206      Latest Ref Rng & Units 04/16/2023    2:55 AM 04/15/2023    6:34 AM 04/14/2023    4:07 AM  BMP   Glucose 70 - 99 mg/dL 469  629  528   BUN 8 - 23 mg/dL 42  41  40   Creatinine 0.61 - 1.24 mg/dL 4.13  2.44  0.10   Sodium 135 - 145 mmol/L 133  133  134   Potassium 3.5 - 5.1 mmol/L 4.6  4.6  4.7   Chloride 98 - 111 mmol/L 108  107  105   CO2 22 - 32 mmol/L 20  18  20    Calcium 8.9 - 10.3 mg/dL 8.0  8.2  8.5     No results found.  Disposition Plan & Communication  Patient status: Inpatient  Admitted From: Home Planned disposition location: SNF Anticipated discharge date: SNF placement pending, medically stable.  Family Communication: none at bedside  wife is reportedly hospitalized at another facility    Author: Pennie Banter, DO [facility] 04/18/2023, 12:53 PM   Available by Epic secure chat 7AM-7PM. If 7PM-7AM, please contact night-coverage.  TRH contact information found on [email or web address].

## 2023-04-18 NOTE — TOC Progression Note (Signed)
Transition of Care [facility]) - Progression Note    Patient Details  Name: [name] MRN: [id number] Date of Birth: [birth year]  Transition of Care [facility]) CM/SW Contact  Garret Reddish, RN Phone Number: 04/18/2023, 3:13 PM  Clinical Narrative:    Chart reviewed.  I have received SNF approval for patient.  SNF auth number is [id number]. Approved form 04-16-2023- 04-21-23.  Next review date is 04-21-23.  I have made [facility] aware.  The facility will be able to accept patient on Monday as ostomy supplies have to be ordered so they can care for the patient's ostomy at the facility.  Wound care notes with ostomy size and information sent to [facility].    I have made Dr. Denton Lank aware.    TOC will continue to follow for discharge planning.     Expected Discharge Plan: Skilled Nursing Facility Barriers to Discharge: Continued Medical Work up  Expected Discharge Plan and Services     Post Acute Care Choice: Skilled Nursing Facility Living arrangements for the past 2 months: Single Family Home                 DME Arranged: N/A DME Agency: NA       HH Arranged: NA HH Agency: NA         Social Determinants of Health (SDOH) Interventions SDOH Screenings   Food Insecurity: No Food Insecurity (02/27/2023)  Housing: Low Risk  (02/27/2023)  Transportation Needs: No Transportation Needs (02/27/2023)  Utilities: Not At Risk (02/27/2023)  Depression (PHQ2-9): Medium Risk (12/04/2022)  Financial Resource Strain: Low Risk  (12/31/2020)   [facility], [facility]  Physical Activity: Inactive (09/09/2017)  Social Connections: Unknown (09/09/2017)  Stress: No Stress Concern Present (09/09/2017)  Tobacco Use: Low Risk  (04/11/2023)    Readmission Risk Interventions    03/05/2023   12:55 PM 02/28/2023   11:21 AM 03/23/2022   12:09 PM  Readmission Risk Prevention Plan  Transportation Screening  Complete Complete  Medication Review Furniture conservator/restorer)  Complete Complete  PCP or Specialist appointment within 3-5 days of discharge  Complete Complete  HRI or Home Care Consult  Complete Complete  SW Recovery Care/Counseling Consult  Complete Complete  Palliative Care Screening  Not Applicable Not Applicable  Skilled Nursing Facility Complete Not Applicable Not Applicable

## 2023-04-18 NOTE — Care Management Important Message (Signed)
Important Message  Patient Details  Name: Sylvan Grimard MRN: [id number] Date of Birth: 05-Aug-1955   Important Message Given:  Yes - Medicare IM     Bernadette Hoit 04/18/2023, 1:55 PM

## 2023-04-18 NOTE — Plan of Care (Signed)

## 2023-04-18 NOTE — TOC Progression Note (Signed)
Transition of Care [facility]) - Progression Note    Patient Details  Name: [name] MRN: [id number] Date of Birth: [birth year]  Transition [facility] Care [facility]) CM/SW Contact  Garret Reddish, RN Phone Number: 04/18/2023, 1:43 PM  Clinical Narrative:     I have received call from [facility] Team asking to send additional clinical information for [name].  Additional clinical information sent.    TOC will continue to follow for discharge planning.    Expected Discharge Plan: Skilled Nursing Facility Barriers to Discharge: Continued Medical Work up  Expected Discharge Plan and Services     Post Acute Care Choice: Skilled Nursing Facility Living arrangements for the past 2 months: Single Family Home                 DME Arranged: N/A DME Agency: NA       HH Arranged: NA HH Agency: NA         Social Determinants of Health (SDOH) Interventions SDOH Screenings   Food Insecurity: No Food Insecurity (02/27/2023)  Housing: Low Risk  (02/27/2023)  Transportation Needs: No Transportation Needs (02/27/2023)  Utilities: Not At Risk (02/27/2023)  Depression (PHQ2-9): Medium Risk (12/04/2022)  Financial Resource Strain: Low Risk  (12/31/2020)   Received from [facility], [facility]  Physical Activity: Inactive (09/09/2017)  Social Connections: Unknown (09/09/2017)  Stress: No Stress Concern Present (09/09/2017)  Tobacco Use: Low Risk  (04/11/2023)    Readmission Risk Interventions    03/05/2023   12:55 PM 02/28/2023   11:21 AM 03/23/2022   12:09 PM  Readmission Risk Prevention Plan  Transportation Screening  Complete Complete  Medication Review Oceanographer)  Complete Complete  PCP or Specialist appointment within 3-5 days of discharge  Complete Complete  HRI or Home Care Consult  Complete Complete  SW Recovery Care/Counseling Consult  Complete Complete  Palliative Care Screening  Not Applicable Not Applicable  Skilled Nursing Facility Complete Not  Applicable Not Applicable

## 2023-04-19 DIAGNOSIS — A419 Sepsis, unspecified organism: Secondary | ICD-10-CM | POA: Diagnosis not present

## 2023-04-19 DIAGNOSIS — N390 Urinary tract infection, site not specified: Secondary | ICD-10-CM | POA: Diagnosis not present

## 2023-04-19 LAB — CBC
HCT: 28.9 % — ABNORMAL LOW (ref 39.0–52.0)
Hemoglobin: 8.9 g/dL — ABNORMAL LOW (ref 13.0–17.0)
MCH: 26.6 pg (ref 26.0–34.0)
MCHC: 30.8 g/dL (ref 30.0–36.0)
MCV: 86.5 fL (ref 80.0–100.0)
Platelets: 233 10*3/uL (ref 150–400)
RBC: 3.34 MIL/uL — ABNORMAL LOW (ref 4.22–5.81)
RDW: 16.8 % — ABNORMAL HIGH (ref 11.5–15.5)
WBC: 6.2 10*3/uL (ref 4.0–10.5)
nRBC: 0 % (ref 0.0–0.2)

## 2023-04-19 MED ORDER — AMOXICILLIN 500 MG PO CAPS: 500.0000 mg | ORAL_CAPSULE | Freq: Three times a day (TID) | ORAL | 0 refills | Status: AC

## 2023-04-19 MED ORDER — AMOXICILLIN 500 MG PO CAPS
500.0000 mg | ORAL_CAPSULE | Freq: Three times a day (TID) | ORAL | Status: DC
  Administered 2023-04-19: 500 mg via ORAL
  Filled 2023-04-19 (×2): qty 1

## 2023-04-19 NOTE — TOC Transition Note (Addendum)
Transition of Care [facility]) - CM/SW Discharge Note   Patient Details  Name: [name] MRN: [id number] Date of Birth: 05-17-1956  Transition of Care [facility]) CM/SW Contact:  Liliana Cline, LCSW Phone Number: 04/19/2023, 1:41 PM   Clinical Narrative:    Patient to DC home today.  CSW notified Elnita Maxwell with [facility]. [facility] arranged. Patient is 3rd on the list. RN notified.    Final next level of care: Home w Home Health Services Barriers to Discharge: Barriers Resolved   Patient Goals and CMS Choice   Choice offered to / list presented to : Patient  Discharge Placement                         Discharge Plan and Services Additional resources added to the After Visit Summary for       Post Acute Care Choice: Skilled Nursing Facility          DME Arranged: N/A DME Agency: NA       HH Arranged: PT, OT, RN HH Agency: [facility] Date [facility] Agency Contacted: 04/19/23   Representative spoke with at [facility] Agency: Elnita Maxwell  Social Determinants of Health (SDOH) Interventions SDOH Screenings   Food Insecurity: No Food Insecurity (02/27/2023)  Housing: Low Risk  (02/27/2023)  Transportation Needs: No Transportation Needs (02/27/2023)  Utilities: Not At Risk (02/27/2023)  Depression (PHQ2-9): Medium Risk (12/04/2022)  Financial Resource Strain: Low Risk  (12/31/2020)   Received from [facility]  Physical Activity: Inactive (09/09/2017)  Social Connections: Unknown (09/09/2017)  Stress: No Stress Concern Present (09/09/2017)  Tobacco Use: Low Risk  (04/11/2023)     Readmission Risk Interventions    03/05/2023   12:55 PM 02/28/2023   11:21 AM 03/23/2022   12:09 PM  Readmission Risk Prevention Plan  Transportation Screening  Complete Complete  Medication Review Oceanographer)  Complete Complete  PCP or Specialist appointment within 3-5 days of discharge  Complete Complete  HRI or Home Care Consult  Complete Complete   SW Recovery Care/Counseling Consult  Complete Complete  Palliative Care Screening  Not Applicable Not Applicable  Skilled Nursing Facility Complete Not Applicable Not Applicable

## 2023-04-19 NOTE — Progress Notes (Signed)
Patient's foley is chronic. Will be discharged with it.

## 2023-04-19 NOTE — TOC Progression Note (Signed)
Transition of Care [facility]) - Progression Note    Patient Details  Name: [name] MRN: [id number] Date of Birth: 24-Nov-1955  Transition of Care [facility]) CM/SW Contact  Garret Reddish, RN Phone Number: 04/19/2023, 7:13 AM  Clinical Narrative:    Received message from Jonny Ruiz with [employer] yesterday evening that he has delivered shower chair to bedside.   TOC will follow for discharge planning.    Expected Discharge Plan: Skilled Nursing Facility Barriers to Discharge: Continued Medical Work up  Expected Discharge Plan and Services     Post Acute Care Choice: Skilled Nursing Facility Living arrangements for the past 2 months: Single Family Home                 DME Arranged: N/A DME Agency: NA       HH Arranged: NA HH Agency: NA         Social Determinants of Health (SDOH) Interventions SDOH Screenings   Food Insecurity: No Food Insecurity (02/27/2023)  Housing: Low Risk  (02/27/2023)  Transportation Needs: No Transportation Needs (02/27/2023)  Utilities: Not At Risk (02/27/2023)  Depression (PHQ2-9): Medium Risk (12/04/2022)  Financial Resource Strain: Low Risk  (12/31/2020)   Received from [facility]  Physical Activity: Inactive (09/09/2017)  Social Connections: Unknown (09/09/2017)  Stress: No Stress Concern Present (09/09/2017)  Tobacco Use: Low Risk  (04/11/2023)    Readmission Risk Interventions    03/05/2023   12:55 PM 02/28/2023   11:21 AM 03/23/2022   12:09 PM  Readmission Risk Prevention Plan  Transportation Screening  Complete Complete  Medication Review Oceanographer)  Complete Complete  PCP or Specialist appointment within 3-5 days of discharge  Complete Complete  HRI or Home Care Consult  Complete Complete  SW Recovery Care/Counseling Consult  Complete Complete  Palliative Care Screening  Not Applicable Not Applicable  Skilled Nursing Facility Complete Not Applicable Not Applicable

## 2023-04-19 NOTE — Discharge Summary (Signed)
Physician Discharge Summary   [name] MRN: [id number] DOB: July 15, 1955  Admit date:     04/11/2023  Discharge date: 04/19/2023  Discharge Physician: Pennie Banter   PCP: Dione Booze, MD   Recommendations at discharge:    Follow up with Primary Care Follow up with Urology Repeat CBC, BMP at follow up  Discharge Diagnoses: Principal Problem:   Sepsis secondary to catheter associated UTI [facility]) Active Problems:   Acute renal failure superimposed on stage 3b chronic kidney disease (HCC)   Colostomy care (HCC)   Pressure injury of skin   Enterococcus faecalis infection  Resolved Problems:   * No resolved hospital problems. *  Hospital Course:  [name] is a 67 y.o. 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   They presented to the ED for evaluation of fever and weakness starting on the day of arrival   ED course: Tmax 102.9, tachycardic to 115 on arrival and tachypneic to 22, O2 sats high 90s on room air. Labs: WBC normal at 8200 with normal lactic acid 1.8 and procalcitonin 0.3.Hemoglobin baseline at 9.9. CMP notable for creatinine 2.64 up from baseline of 2 a month prior, bicarb 19.  Urinalysis with large leukocyte esterase and rare bacteria. EKG: sinus tachycardia at 107 with no acute ST-T wave changes. Chest x-ray with no active disease.   They were initially treated with cefepime for sepsis of suspected urinary source as well as IV fluids.    Patient was admitted to medicine service for further workup and management of urosepsis as outlined in detail below.   10/14- UxCx  sensitivities returned with Enterococcus faecalis and another GNR so unfortunately, was not being adequately covered with cefepime but he remained in stable condition. Changed to unasyn.  PT evaluated and recommended SNF at discharge. TOC engaged for placement   04/18/23 -remains stable on unasyn.  Second infectious agent identified now as ACINETOBACTER BAUMANNII.  Remain on unasyn and follow for sensitivities.    Pt was awaiting SNF placement, but ultimately his wife decided to take patient home with [facility] services, as only one facility able to offer a bed, wife did not want pt to go there  10/19 --- pt remains medically stable for d/c home with [facility].    Assessment and Plan:  Sepsis secondary to catheter associated UTI (HCC)- urine culture positive for Enterococcus faecalis (>100k CFU) and less significantly acinetobacter baumannii (felt less likely pathogenic, 20k CFU).  Chronic Foley catheter- exchanged on admission prior to culture Sepsis criteria include fever, tachycardia and tachypnea.  WBC 8.2 and lactic acid 1.8 and procalcitonin 0.3. sepsis criteria have resolved and he is stable ORA - Treated with IV  Unasyn  --d/c on PO amoxicillin to complete 7 day course  - blood cx NGTD - follow to final   Acute renal failure superimposed on stage 3b chronic kidney disease (HCC)-   Cr was risin\g 2.3>2.5>2.2 yesterday >> to 2.09 on 10/16 - at baseline Baseline Cr ~2 - Monitor BMP's    Colostomy in place [facility]) Colostomy care   Anemia of chronic disease- hgb 10.0>>8.6>9.1>8.7 >8.7 stable No reports or evidence of bleeding - CBC am   Hyponatremia- stable at 387>564>332 - continue home  daily salt tablets   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) Increased nursing assistance for communication of needs Continue memantine, quetiapine and Trileptal - PT/OT evaluation     Obesity, Class III, BMI 40-49.9 (morbid obesity) (HCC) Complicating factor  to overall prognosis and care   Decubitus ulcers- POA History of sacral and heel ulcers Wound care consult for decubitus care and recommendations Decubitus precautions   Type II diabetes mellitus with renal manifestations (HCC) Sliding scale insulin coverage   Hypertension - continue home amlodipine   Chronic anticoagulation - on Eliquis, h/o DVT - continue home Eliquis   Morbid Obesity Body mass index is 48.42 kg/m. Complicates overall care and prognosis.  Recommend lifestyle modifications including physical activity and diet for weight loss and overall long-term health.       Consultants: None Procedures performed: None Disposition: Home health Diet recommendation:  Regular diet DISCHARGE MEDICATION: Allergies as of 04/19/2023   No Known Allergies      Medication List     TAKE these medications    amLODipine-olmesartan 5-20 MG tablet Commonly known as: AZOR Take 1 tablet by mouth daily.   cyanocobalamin 1000 MCG tablet Commonly known as: VITAMIN B12 Take 3,000 mcg by mouth daily.   Dexcom G7 Receiver Devi UAD for blood sugar monitoring.   Eliquis 5 MG Tabs tablet Generic drug: apixaban Take 5 mg by mouth 2 (two) times daily.   fish oil-omega-3 fatty acids 1000 MG capsule Take 1 g by mouth 3 (three) times a week.   furosemide 40 MG tablet Commonly known as: LASIX Take 1 tablet (40 mg total) by mouth daily.   glipiZIDE 2.5 MG 24 hr tablet Commonly known as: GLUCOTROL XL Take 2.5 mg by mouth daily.   memantine 10 MG tablet Commonly known as: NAMENDA Take 10 mg by mouth 2 (two) times daily.   Oxcarbazepine 300 MG tablet Commonly known as: TRILEPTAL Take 1 tablet (300 mg total) by mouth 2 (two) times daily.   QUEtiapine 25 MG tablet Commonly known as: SEROQUEL TAKE 1 TO 2 TABLETS BY MOUTH ONCE DAILY AS NEEDED What changed: See the new instructions.   rosuvastatin 20 MG tablet Commonly known as: CRESTOR Take 20 mg by mouth at bedtime.    sodium bicarbonate 650 MG tablet Take 1 tablet (650 mg total) by mouth daily.   vitamin D3 25 MCG tablet Commonly known as: CHOLECALCIFEROL Take 1 tablet (1,000 Units total) by mouth daily.       ASK your doctor about these medications    amoxicillin 500 MG capsule Commonly known as: AMOXIL Take 1 capsule (500 mg total) by mouth every 8 (eight) hours for 2 days. Ask about: Should I take this medication?               Discharge Care Instructions  (From admission, onward)           Start     Ordered   04/19/23 0000  Discharge wound care:       Comments: 1.  Cleanse wounds with saline, pat dry 2.  Cut to fit silver hydrofiber (Aquacel Ag+)   - top with foam (heels) - top sacral wound with ABD pads and secure with tape.  3.  Change all wound dressings daily   04/19/23 1410            Discharge Exam: Filed Weights   04/11/23 2204  Weight: 136.1 kg   General exam: awake, alert, no acute distress, obese  HEENT: moist mucus membranes, hearing grossly normal  Respiratory system: on romo air, normal respiratory effort. Cardiovascular system: RRR, stable trace BLE edema.   Gastrointestinal system: soft, NT, ND Central nervous system: A&O x 3. no gross focal neurologic deficits, normal speech Skin: dry, intact, normal temperature Psychiatry: normal mood, congruent affect, judgement and insight appear normal   Condition at discharge: stable  The results of significant diagnostics from this hospitalization (including imaging, microbiology, ancillary and laboratory) are listed below for reference.   Imaging Studies: DG Chest Port 1 View  Result Date: 04/12/2023 CLINICAL DATA:  Weakness, fever EXAM: PORTABLE CHEST 1 VIEW COMPARISON:  02/26/2023 FINDINGS: The heart size and mediastinal contours are within normal limits. Both lungs are clear. The visualized skeletal structures are unremarkable. IMPRESSION: No active disease. Electronically Signed   By: Charlett Nose M.D.   On: 04/12/2023 00:24    Microbiology: Results for orders placed or performed during the hospital encounter of 04/11/23  Culture, blood (Routine x 2)     Status: None   Collection Time: 04/11/23 10:06 PM   Specimen: Right Antecubital; Blood  Result Value Ref Range Status   Specimen Description RIGHT ANTECUBITAL  Final   Special Requests   Final    BOTTLES DRAWN AEROBIC AND ANAEROBIC Blood Culture results may not be optimal due to an inadequate volume of blood received in culture bottles   Culture   Final    NO GROWTH 5 DAYS Performed at [facility], [address] [place], [place] [address]    Report Status 04/16/2023 FINAL  Final  Culture, blood (Routine x 2)     Status: None   Collection Time: 04/12/23 12:21 AM   Specimen: BLOOD  Result Value Ref Range Status   Specimen Description BLOOD BLOOD LEFT ARM  Final   Special Requests   Final    BOTTLES DRAWN AEROBIC AND ANAEROBIC Blood Culture adequate volume   Culture   Final    NO GROWTH 5 DAYS Performed at [facility], [address] [place], [place] [address]    Report Status 04/17/2023 FINAL  Final  Remove and replace urinary cath (placed > 5 days) then obtain urine culture from new indwelling urinary catheter.     Status: Abnormal   Collection Time: 04/12/23  9:22 AM   Specimen: Urine, Catheterized  Result Value Ref Range Status   Specimen Description   Final    URINE, CATHETERIZED Performed at [facility], [address] [place], [place] [address]    Special Requests   Final    NONE Performed at [facility], [address] [place], [place] [address]    Culture (A)  Final    >=100,000 COLONIES/mL ENTEROCOCCUS FAECALIS 20,000 COLONIES/mL ACINETOBACTER BAUMANNII    Report Status 04/16/2023 FINAL  Final   Organism ID, Bacteria ENTEROCOCCUS FAECALIS (A)  Final   Organism ID, Bacteria ACINETOBACTER BAUMANNII (A)  Final      Susceptibility   Acinetobacter  baumannii - MIC*    CEFTAZIDIME 4 SENSITIVE Sensitive     CIPROFLOXACIN >=4 RESISTANT Resistant     GENTAMICIN 4 SENSITIVE Sensitive     IMIPENEM <=0.25 SENSITIVE Sensitive     PIP/TAZO <=4 SENSITIVE Sensitive ug/mL    TRIMETH/SULFA <=20 SENSITIVE Sensitive     AMPICILLIN/SULBACTAM <=2 SENSITIVE Sensitive     * 20,000 COLONIES/mL ACINETOBACTER BAUMANNII   Enterococcus faecalis - MIC*    AMPICILLIN <=2 SENSITIVE Sensitive     NITROFURANTOIN 32 SENSITIVE  Sensitive     VANCOMYCIN 1 SENSITIVE Sensitive     * >=100,000 COLONIES/mL ENTEROCOCCUS FAECALIS    Labs: CBC: Recent Labs  Lab 04/19/23 0410  WBC 6.2  HGB 8.9*  HCT 28.9*  MCV 86.5  PLT 233   Basic Metabolic Panel: No results for input(s): "NA", "K", "CL", "CO2", "GLUCOSE", "BUN", "CREATININE", "CALCIUM", "MG", "PHOS" in the last 168 hours.  Liver Function Tests: No results for input(s): "AST", "ALT", "ALKPHOS", "BILITOT", "PROT", "ALBUMIN" in the last 168 hours. CBG: No results for input(s): "GLUCAP" in the last 168 hours.  Discharge time spent: less than 30 minutes.  Signed: Pennie Banter, DO [facility] 04/25/2023

## 2023-04-19 NOTE — Progress Notes (Signed)
Patient appears comfortable in bed. [facility] present to transport patient home. EMS stated they are unable to take the shower chair. Patient's wife, [name] made aware. She will pick up the chair tomorrow.

## 2023-04-19 NOTE — Plan of Care (Signed)

## 2023-04-22 ENCOUNTER — Encounter: Payer: Medicare Other | Admitting: Physician Assistant

## 2023-04-22 DIAGNOSIS — E11622 Type 2 diabetes mellitus with other skin ulcer: Secondary | ICD-10-CM | POA: Diagnosis not present

## 2023-04-22 DIAGNOSIS — I10 Essential (primary) hypertension: Secondary | ICD-10-CM | POA: Diagnosis not present

## 2023-04-22 DIAGNOSIS — M6281 Muscle weakness (generalized): Secondary | ICD-10-CM | POA: Diagnosis not present

## 2023-04-22 DIAGNOSIS — L97822 Non-pressure chronic ulcer of other part of left lower leg with fat layer exposed: Secondary | ICD-10-CM | POA: Diagnosis not present

## 2023-04-22 DIAGNOSIS — L24A0 Irritant contact dermatitis due to friction or contact with body fluids, unspecified: Secondary | ICD-10-CM | POA: Diagnosis not present

## 2023-04-22 DIAGNOSIS — L89613 Pressure ulcer of right heel, stage 3: Secondary | ICD-10-CM | POA: Diagnosis not present

## 2023-04-22 DIAGNOSIS — F319 Bipolar disorder, unspecified: Secondary | ICD-10-CM | POA: Diagnosis not present

## 2023-04-22 DIAGNOSIS — Z7901 Long term (current) use of anticoagulants: Secondary | ICD-10-CM | POA: Diagnosis not present

## 2023-04-22 DIAGNOSIS — Z87820 Personal history of traumatic brain injury: Secondary | ICD-10-CM | POA: Diagnosis not present

## 2023-04-22 DIAGNOSIS — L89154 Pressure ulcer of sacral region, stage 4: Secondary | ICD-10-CM | POA: Diagnosis present

## 2023-04-22 DIAGNOSIS — L98412 Non-pressure chronic ulcer of buttock with fat layer exposed: Secondary | ICD-10-CM | POA: Diagnosis not present

## 2023-04-22 NOTE — Progress Notes (Addendum)
been about a month since have seen him as he was septic due to UTI and ended up in rehab following due to being weak. With that being said he does have a new wound on the left lower extremity which is secondary to having been placed in the wheelchair left they  are too long during the time he was in rehab according to his wife. 04-22-2023 upon evaluation today patient appears to be doing well currently in regard to his wounds which all actually appear to be doing a little bit better if not completely better with the ankle already being healed. With that being said he was in the hospital from October 11 through October 19 due to what is stated to be sepsis secondary to catheter associated UTI specifically Enterococcus is what was mentioned. With that being said he seems to be doing much better at this point. They do have a Hoyer at home which has been of great benefit and he is staying off of this more at home which I think has helped all of his wounds as well. Fortunately I do not see any signs of active infection locally or systemically at the moment which is great news and I do believe that he is doing well in that regard. Electronic Signature(s) Signed: 04/22/2023 9:25:44 AM By: Allen Derry PA-C Entered By: Allen Derry on 04/22/2023 06:25:44 -------------------------------------------------------------------------------- Physical Exam Details Patient Name: Date of Service: [name] [facility] 04/22/2023 8:45 A M Medical Record Number: [id number] Patient Account Number: [id number] Date of Birth/Sex: Treating RN: 04/29/1956 (66 y.o. [name]) [name] Primary Care Provider: Dione Booze Other Clinician: Referring Provider: Treating Provider/Extender: [name] in Treatment: 52 Constitutional Well-nourished and well-hydrated in no acute distress. Respiratory normal breathing without difficulty. MASTON, VANETTEN ([id number]) [id number] Page 4 of 11 Psychiatric this patient is able to make decisions and demonstrates good insight into disease process. Alert and Oriented x 3. pleasant and cooperative. Notes Patient's wounds again are showing signs of excellent improvement I am  actually very pleased with where we stand both in regard to the heel which is doing much better, the ankle which is healed, and the sacral area which is slow but still making progress here. Fortunately I think that we are on the right track and I would recommend that we continue as such. Electronic Signature(s) Signed: 04/22/2023 9:26:10 AM By: Allen Derry PA-C Entered By: Allen Derry on 04/22/2023 06:26:10 -------------------------------------------------------------------------------- Physician Orders Details Patient Name: Date of Service: [name] [facility] 04/22/2023 8:45 A M Medical Record Number: [id number] Patient Account Number: [id number] Date of Birth/Sex: Treating RN: 07-03-1955 (66 y.o. [name]) [name] Primary Care Provider: Dione Booze Other Clinician: Referring Provider: Treating Provider/Extender: [name] in Treatment: 69 Verbal / Phone Orders: No Diagnosis Coding ICD-10 Coding Code Description L89.154 Pressure ulcer of sacral region, stage 4 L89.613 Pressure ulcer of right heel, stage 3 L97.822 Non-pressure chronic ulcer of other part of left lower leg with fat layer exposed M62.81 Muscle weakness (generalized) F31.9 Bipolar disorder, unspecified E11.622 Type 2 diabetes mellitus with other skin ulcer I10 Essential (primary) hypertension Z79.01 Long term (current) use of anticoagulants 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 Home [facility] for wound care. May utilize formulary equivalent dressing for wound treatment orders unless otherwise specified. Home Health Nurse may visit PRN to address patients wound care needs. Beverly Gust [phone] **Please direct any NON-WOUND related issues/requests for orders  PELE, STUMPO ([id number]) [id number] Page 1 of 11 Visit Report for 04/22/2023 Chief Complaint Document Details Patient Name: Date of Service: [name] [facility] 04/22/2023 8:45 A M Medical Record Number: [id number] Patient Account Number: [id number] Date of Birth/Sex: Treating RN: April 17, 1956 (66 y.o. [name]) [name] Primary Care Provider: Dione Booze Other Clinician: Referring Provider: Treating Provider/Extender: [name] in Treatment: 8 Information Obtained from: Patient Chief Complaint Sacral, right heel, and left leg ulcers Electronic Signature(s) Signed: 04/22/2023 8:59:16 AM By: Allen Derry PA-C Entered By: Allen Derry on 04/22/2023 05:59:16 -------------------------------------------------------------------------------- HPI Details Patient Name: Date of Service: [name] [facility] 04/22/2023 8:45 A M Medical Record Number: [id number] Patient Account Number: [id number] Date of Birth/Sex: Treating RN: September 12, 1955 (66 y.o. [name]) [name] Primary Care Provider: Dione Booze Other Clinician: Referring Provider: Treating Provider/Extender: [name] in Treatment: 75 History of Present Illness HPI Description: 05/29/2021 this is a patient who presents today for initial inspection here in the clinic concerning wounds that he has over the right heel and the sacral region. Unfortunately the sacral wound is starting to spread off to the right gluteal location due to how he sits always leaning towards the right side in his chair. His wife is present she is the primary caregiver though she is not home with him all the time she does have to work. She does do an excellent job however it appears to be in trying to keep things under good control for him. The patient is not able to change positions himself nor walk by himself so he is pretty much at the mercy of the position he is  putting when she is gone and this tends to be his chair which she sits and most of the day. Obviously this I think is the main culprit for what is going on currently. It was actually in January 2020 when the sacral wound started. It was in September 2022 when the wound started to spread more to the right gluteal location. Subsequently in August 2022 is when he had been in a skilled nursing facility and the heel started to give him trouble as well. That does not seem to be doing nearly as poorly as the sacral region. He was hospitalized in October 2022 secondary to sepsis and this was in regard to the foot and was sent to skilled nursing again he is now back at home. He did have a wound VAC for the sacral wound over the summer 2022 but being in and out of facilities this ended up getting sent back. The patient does have [facility] home health that comes out 1 time per week to help with care. His most recent hemoglobin A1c was 6.9 in August 2022. 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 s. DAZON, SURGES ([id number]) [id number] Page 2 of 11 07/24/2021 upon evaluation today patient appears to be doing decently well in regard to the pressure ulcer on the right heel as well as the sacral region. In general I think you are making some progress here which is great news. Overall the heel unfortunately had already closed previously when we saw him although it apparently reopened when he was working with physical therapy according to his wife. The area in the sacral region is doing well and looks clean there is still some depth here  PELE, STUMPO ([id number]) [id number] Page 1 of 11 Visit Report for 04/22/2023 Chief Complaint Document Details Patient Name: Date of Service: [name] [facility] 04/22/2023 8:45 A M Medical Record Number: [id number] Patient Account Number: [id number] Date of Birth/Sex: Treating RN: April 17, 1956 (66 y.o. [name]) [name] Primary Care Provider: Dione Booze Other Clinician: Referring Provider: Treating Provider/Extender: [name] in Treatment: 8 Information Obtained from: Patient Chief Complaint Sacral, right heel, and left leg ulcers Electronic Signature(s) Signed: 04/22/2023 8:59:16 AM By: Allen Derry PA-C Entered By: Allen Derry on 04/22/2023 05:59:16 -------------------------------------------------------------------------------- HPI Details Patient Name: Date of Service: [name] [facility] 04/22/2023 8:45 A M Medical Record Number: [id number] Patient Account Number: [id number] Date of Birth/Sex: Treating RN: September 12, 1955 (66 y.o. [name]) [name] Primary Care Provider: Dione Booze Other Clinician: Referring Provider: Treating Provider/Extender: [name] in Treatment: 75 History of Present Illness HPI Description: 05/29/2021 this is a patient who presents today for initial inspection here in the clinic concerning wounds that he has over the right heel and the sacral region. Unfortunately the sacral wound is starting to spread off to the right gluteal location due to how he sits always leaning towards the right side in his chair. His wife is present she is the primary caregiver though she is not home with him all the time she does have to work. She does do an excellent job however it appears to be in trying to keep things under good control for him. The patient is not able to change positions himself nor walk by himself so he is pretty much at the mercy of the position he is  putting when she is gone and this tends to be his chair which she sits and most of the day. Obviously this I think is the main culprit for what is going on currently. It was actually in January 2020 when the sacral wound started. It was in September 2022 when the wound started to spread more to the right gluteal location. Subsequently in August 2022 is when he had been in a skilled nursing facility and the heel started to give him trouble as well. That does not seem to be doing nearly as poorly as the sacral region. He was hospitalized in October 2022 secondary to sepsis and this was in regard to the foot and was sent to skilled nursing again he is now back at home. He did have a wound VAC for the sacral wound over the summer 2022 but being in and out of facilities this ended up getting sent back. The patient does have [facility] home health that comes out 1 time per week to help with care. His most recent hemoglobin A1c was 6.9 in August 2022. 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 s. DAZON, SURGES ([id number]) [id number] Page 2 of 11 07/24/2021 upon evaluation today patient appears to be doing decently well in regard to the pressure ulcer on the right heel as well as the sacral region. In general I think you are making some progress here which is great news. Overall the heel unfortunately had already closed previously when we saw him although it apparently reopened when he was working with physical therapy according to his wife. The area in the sacral region is doing well and looks clean there is still some depth here  been about a month since have seen him as he was septic due to UTI and ended up in rehab following due to being weak. With that being said he does have a new wound on the left lower extremity which is secondary to having been placed in the wheelchair left they  are too long during the time he was in rehab according to his wife. 04-22-2023 upon evaluation today patient appears to be doing well currently in regard to his wounds which all actually appear to be doing a little bit better if not completely better with the ankle already being healed. With that being said he was in the hospital from October 11 through October 19 due to what is stated to be sepsis secondary to catheter associated UTI specifically Enterococcus is what was mentioned. With that being said he seems to be doing much better at this point. They do have a Hoyer at home which has been of great benefit and he is staying off of this more at home which I think has helped all of his wounds as well. Fortunately I do not see any signs of active infection locally or systemically at the moment which is great news and I do believe that he is doing well in that regard. Electronic Signature(s) Signed: 04/22/2023 9:25:44 AM By: Allen Derry PA-C Entered By: Allen Derry on 04/22/2023 06:25:44 -------------------------------------------------------------------------------- Physical Exam Details Patient Name: Date of Service: [name] [facility] 04/22/2023 8:45 A M Medical Record Number: [id number] Patient Account Number: [id number] Date of Birth/Sex: Treating RN: 04/29/1956 (66 y.o. [name]) [name] Primary Care Provider: Dione Booze Other Clinician: Referring Provider: Treating Provider/Extender: [name] in Treatment: 52 Constitutional Well-nourished and well-hydrated in no acute distress. Respiratory normal breathing without difficulty. MASTON, VANETTEN ([id number]) [id number] Page 4 of 11 Psychiatric this patient is able to make decisions and demonstrates good insight into disease process. Alert and Oriented x 3. pleasant and cooperative. Notes Patient's wounds again are showing signs of excellent improvement I am  actually very pleased with where we stand both in regard to the heel which is doing much better, the ankle which is healed, and the sacral area which is slow but still making progress here. Fortunately I think that we are on the right track and I would recommend that we continue as such. Electronic Signature(s) Signed: 04/22/2023 9:26:10 AM By: Allen Derry PA-C Entered By: Allen Derry on 04/22/2023 06:26:10 -------------------------------------------------------------------------------- Physician Orders Details Patient Name: Date of Service: [name] [facility] 04/22/2023 8:45 A M Medical Record Number: [id number] Patient Account Number: [id number] Date of Birth/Sex: Treating RN: 07-03-1955 (66 y.o. [name]) [name] Primary Care Provider: Dione Booze Other Clinician: Referring Provider: Treating Provider/Extender: [name] in Treatment: 69 Verbal / Phone Orders: No Diagnosis Coding ICD-10 Coding Code Description L89.154 Pressure ulcer of sacral region, stage 4 L89.613 Pressure ulcer of right heel, stage 3 L97.822 Non-pressure chronic ulcer of other part of left lower leg with fat layer exposed M62.81 Muscle weakness (generalized) F31.9 Bipolar disorder, unspecified E11.622 Type 2 diabetes mellitus with other skin ulcer I10 Essential (primary) hypertension Z79.01 Long term (current) use of anticoagulants 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 Home Health [facility] for wound care. May utilize formulary equivalent dressing for wound treatment orders unless otherwise specified. Home Health Nurse may visit PRN to address patients wound care needs. Beverly Gust [phone] **Please direct any NON-WOUND related issues/requests for orders  been about a month since have seen him as he was septic due to UTI and ended up in rehab following due to being weak. With that being said he does have a new wound on the left lower extremity which is secondary to having been placed in the wheelchair left they  are too long during the time he was in rehab according to his wife. 04-22-2023 upon evaluation today patient appears to be doing well currently in regard to his wounds which all actually appear to be doing a little bit better if not completely better with the ankle already being healed. With that being said he was in the hospital from October 11 through October 19 due to what is stated to be sepsis secondary to catheter associated UTI specifically Enterococcus is what was mentioned. With that being said he seems to be doing much better at this point. They do have a Hoyer at home which has been of great benefit and he is staying off of this more at home which I think has helped all of his wounds as well. Fortunately I do not see any signs of active infection locally or systemically at the moment which is great news and I do believe that he is doing well in that regard. Electronic Signature(s) Signed: 04/22/2023 9:25:44 AM By: Allen Derry PA-C Entered By: Allen Derry on 04/22/2023 06:25:44 -------------------------------------------------------------------------------- Physical Exam Details Patient Name: Date of Service: [name] [facility] 04/22/2023 8:45 A M Medical Record Number: [id number] Patient Account Number: [id number] Date of Birth/Sex: Treating RN: 04/29/1956 (66 y.o. [name]) [name] Primary Care Provider: Dione Booze Other Clinician: Referring Provider: Treating Provider/Extender: [name] in Treatment: 52 Constitutional Well-nourished and well-hydrated in no acute distress. Respiratory normal breathing without difficulty. MASTON, VANETTEN ([id number]) [id number] Page 4 of 11 Psychiatric this patient is able to make decisions and demonstrates good insight into disease process. Alert and Oriented x 3. pleasant and cooperative. Notes Patient's wounds again are showing signs of excellent improvement I am  actually very pleased with where we stand both in regard to the heel which is doing much better, the ankle which is healed, and the sacral area which is slow but still making progress here. Fortunately I think that we are on the right track and I would recommend that we continue as such. Electronic Signature(s) Signed: 04/22/2023 9:26:10 AM By: Allen Derry PA-C Entered By: Allen Derry on 04/22/2023 06:26:10 -------------------------------------------------------------------------------- Physician Orders Details Patient Name: Date of Service: [name] [facility] 04/22/2023 8:45 A M Medical Record Number: [id number] Patient Account Number: [id number] Date of Birth/Sex: Treating RN: 07-03-1955 (66 y.o. [name]) [name] Primary Care Provider: Dione Booze Other Clinician: Referring Provider: Treating Provider/Extender: [name] in Treatment: 69 Verbal / Phone Orders: No Diagnosis Coding ICD-10 Coding Code Description L89.154 Pressure ulcer of sacral region, stage 4 L89.613 Pressure ulcer of right heel, stage 3 L97.822 Non-pressure chronic ulcer of other part of left lower leg with fat layer exposed M62.81 Muscle weakness (generalized) F31.9 Bipolar disorder, unspecified E11.622 Type 2 diabetes mellitus with other skin ulcer I10 Essential (primary) hypertension Z79.01 Long term (current) use of anticoagulants 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 Home [facility] for wound care. May utilize formulary equivalent dressing for wound treatment orders unless otherwise specified. Home Health Nurse may visit PRN to address patients wound care needs. Beverly Gust [phone] **Please direct any NON-WOUND related issues/requests for orders  been about a month since have seen him as he was septic due to UTI and ended up in rehab following due to being weak. With that being said he does have a new wound on the left lower extremity which is secondary to having been placed in the wheelchair left they  are too long during the time he was in rehab according to his wife. 04-22-2023 upon evaluation today patient appears to be doing well currently in regard to his wounds which all actually appear to be doing a little bit better if not completely better with the ankle already being healed. With that being said he was in the hospital from October 11 through October 19 due to what is stated to be sepsis secondary to catheter associated UTI specifically Enterococcus is what was mentioned. With that being said he seems to be doing much better at this point. They do have a Hoyer at home which has been of great benefit and he is staying off of this more at home which I think has helped all of his wounds as well. Fortunately I do not see any signs of active infection locally or systemically at the moment which is great news and I do believe that he is doing well in that regard. Electronic Signature(s) Signed: 04/22/2023 9:25:44 AM By: Allen Derry PA-C Entered By: Allen Derry on 04/22/2023 06:25:44 -------------------------------------------------------------------------------- Physical Exam Details Patient Name: Date of Service: [name] [facility] 04/22/2023 8:45 A M Medical Record Number: [id number] Patient Account Number: [id number] Date of Birth/Sex: Treating RN: 04/29/1956 (66 y.o. [name]) [name] Primary Care Provider: Dione Booze Other Clinician: Referring Provider: Treating Provider/Extender: [name] in Treatment: 52 Constitutional Well-nourished and well-hydrated in no acute distress. Respiratory normal breathing without difficulty. MASTON, VANETTEN ([id number]) [id number] Page 4 of 11 Psychiatric this patient is able to make decisions and demonstrates good insight into disease process. Alert and Oriented x 3. pleasant and cooperative. Notes Patient's wounds again are showing signs of excellent improvement I am  actually very pleased with where we stand both in regard to the heel which is doing much better, the ankle which is healed, and the sacral area which is slow but still making progress here. Fortunately I think that we are on the right track and I would recommend that we continue as such. Electronic Signature(s) Signed: 04/22/2023 9:26:10 AM By: Allen Derry PA-C Entered By: Allen Derry on 04/22/2023 06:26:10 -------------------------------------------------------------------------------- Physician Orders Details Patient Name: Date of Service: [name] [facility] 04/22/2023 8:45 A M Medical Record Number: [id number] Patient Account Number: [id number] Date of Birth/Sex: Treating RN: 07-03-1955 (66 y.o. [name]) [name] Primary Care Provider: Dione Booze Other Clinician: Referring Provider: Treating Provider/Extender: [name] in Treatment: 69 Verbal / Phone Orders: No Diagnosis Coding ICD-10 Coding Code Description L89.154 Pressure ulcer of sacral region, stage 4 L89.613 Pressure ulcer of right heel, stage 3 L97.822 Non-pressure chronic ulcer of other part of left lower leg with fat layer exposed M62.81 Muscle weakness (generalized) F31.9 Bipolar disorder, unspecified E11.622 Type 2 diabetes mellitus with other skin ulcer I10 Essential (primary) hypertension Z79.01 Long term (current) use of anticoagulants 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 Home [facility] for wound care. May utilize formulary equivalent dressing for wound treatment orders unless otherwise specified. Home Health Nurse may visit PRN to address patients wound care needs. Beverly Gust [phone] **Please direct any NON-WOUND related issues/requests for orders  been about a month since have seen him as he was septic due to UTI and ended up in rehab following due to being weak. With that being said he does have a new wound on the left lower extremity which is secondary to having been placed in the wheelchair left they  are too long during the time he was in rehab according to his wife. 04-22-2023 upon evaluation today patient appears to be doing well currently in regard to his wounds which all actually appear to be doing a little bit better if not completely better with the ankle already being healed. With that being said he was in the hospital from October 11 through October 19 due to what is stated to be sepsis secondary to catheter associated UTI specifically Enterococcus is what was mentioned. With that being said he seems to be doing much better at this point. They do have a Hoyer at home which has been of great benefit and he is staying off of this more at home which I think has helped all of his wounds as well. Fortunately I do not see any signs of active infection locally or systemically at the moment which is great news and I do believe that he is doing well in that regard. Electronic Signature(s) Signed: 04/22/2023 9:25:44 AM By: Allen Derry PA-C Entered By: Allen Derry on 04/22/2023 06:25:44 -------------------------------------------------------------------------------- Physical Exam Details Patient Name: Date of Service: [name] [facility] 04/22/2023 8:45 A M Medical Record Number: [id number] Patient Account Number: [id number] Date of Birth/Sex: Treating RN: 04/29/1956 (66 y.o. [name]) [name] Primary Care Provider: Dione Booze Other Clinician: Referring Provider: Treating Provider/Extender: [name] in Treatment: 52 Constitutional Well-nourished and well-hydrated in no acute distress. Respiratory normal breathing without difficulty. MASTON, VANETTEN ([id number]) [id number] Page 4 of 11 Psychiatric this patient is able to make decisions and demonstrates good insight into disease process. Alert and Oriented x 3. pleasant and cooperative. Notes Patient's wounds again are showing signs of excellent improvement I am  actually very pleased with where we stand both in regard to the heel which is doing much better, the ankle which is healed, and the sacral area which is slow but still making progress here. Fortunately I think that we are on the right track and I would recommend that we continue as such. Electronic Signature(s) Signed: 04/22/2023 9:26:10 AM By: Allen Derry PA-C Entered By: Allen Derry on 04/22/2023 06:26:10 -------------------------------------------------------------------------------- Physician Orders Details Patient Name: Date of Service: [name] [facility] 04/22/2023 8:45 A M Medical Record Number: [id number] Patient Account Number: [id number] Date of Birth/Sex: Treating RN: 07-03-1955 (66 y.o. [name]) [name] Primary Care Provider: Dione Booze Other Clinician: Referring Provider: Treating Provider/Extender: [name] in Treatment: 69 Verbal / Phone Orders: No Diagnosis Coding ICD-10 Coding Code Description L89.154 Pressure ulcer of sacral region, stage 4 L89.613 Pressure ulcer of right heel, stage 3 L97.822 Non-pressure chronic ulcer of other part of left lower leg with fat layer exposed M62.81 Muscle weakness (generalized) F31.9 Bipolar disorder, unspecified E11.622 Type 2 diabetes mellitus with other skin ulcer I10 Essential (primary) hypertension Z79.01 Long term (current) use of anticoagulants 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 Home Health [facility] for wound care. May utilize formulary equivalent dressing for wound treatment orders unless otherwise specified. Home Health Nurse may visit PRN to address patients wound care needs. Beverly Gust [phone] **Please direct any NON-WOUND related issues/requests for orders  PELE, STUMPO ([id number]) [id number] Page 1 of 11 Visit Report for 04/22/2023 Chief Complaint Document Details Patient Name: Date of Service: [name] [facility] 04/22/2023 8:45 A M Medical Record Number: [id number] Patient Account Number: [id number] Date of Birth/Sex: Treating RN: April 17, 1956 (66 y.o. [name]) [name] Primary Care Provider: Dione Booze Other Clinician: Referring Provider: Treating Provider/Extender: [name] in Treatment: 8 Information Obtained from: Patient Chief Complaint Sacral, right heel, and left leg ulcers Electronic Signature(s) Signed: 04/22/2023 8:59:16 AM By: Allen Derry PA-C Entered By: Allen Derry on 04/22/2023 05:59:16 -------------------------------------------------------------------------------- HPI Details Patient Name: Date of Service: [name] [facility] 04/22/2023 8:45 A M Medical Record Number: [id number] Patient Account Number: [id number] Date of Birth/Sex: Treating RN: September 12, 1955 (66 y.o. [name]) [name] Primary Care Provider: Dione Booze Other Clinician: Referring Provider: Treating Provider/Extender: [name] in Treatment: 75 History of Present Illness HPI Description: 05/29/2021 this is a patient who presents today for initial inspection here in the clinic concerning wounds that he has over the right heel and the sacral region. Unfortunately the sacral wound is starting to spread off to the right gluteal location due to how he sits always leaning towards the right side in his chair. His wife is present she is the primary caregiver though she is not home with him all the time she does have to work. She does do an excellent job however it appears to be in trying to keep things under good control for him. The patient is not able to change positions himself nor walk by himself so he is pretty much at the mercy of the position he is  putting when she is gone and this tends to be his chair which she sits and most of the day. Obviously this I think is the main culprit for what is going on currently. It was actually in January 2020 when the sacral wound started. It was in September 2022 when the wound started to spread more to the right gluteal location. Subsequently in August 2022 is when he had been in a skilled nursing facility and the heel started to give him trouble as well. That does not seem to be doing nearly as poorly as the sacral region. He was hospitalized in October 2022 secondary to sepsis and this was in regard to the foot and was sent to skilled nursing again he is now back at home. He did have a wound VAC for the sacral wound over the summer 2022 but being in and out of facilities this ended up getting sent back. The patient does have [facility] home health that comes out 1 time per week to help with care. His most recent hemoglobin A1c was 6.9 in August 2022. 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 s. DAZON, SURGES ([id number]) [id number] Page 2 of 11 07/24/2021 upon evaluation today patient appears to be doing decently well in regard to the pressure ulcer on the right heel as well as the sacral region. In general I think you are making some progress here which is great news. Overall the heel unfortunately had already closed previously when we saw him although it apparently reopened when he was working with physical therapy according to his wife. The area in the sacral region is doing well and looks clean there is still some depth here  [place] Silos in Treatment: [address] Warrior, [name] ([id number])  [id number] Page 6 of 11 ICD-10 Encounter Code Description Active Date MDM Diagnosis L89.154 Pressure ulcer of sacral region, stage 4 11/22/2021 No Yes L89.613 Pressure ulcer of right heel, stage 3 11/22/2021 No Yes L97.822 Non-pressure chronic ulcer of other part of left lower leg with fat layer exposed10/07/2022 No Yes M62.81 Muscle weakness (generalized) 11/22/2021 No Yes F31.9 Bipolar disorder, unspecified 11/22/2021 No Yes E11.622 Type 2 diabetes mellitus with other skin ulcer 11/22/2021 No Yes I10 Essential (primary) hypertension 11/22/2021 No Yes Z79.01 Long term (current) use of anticoagulants 11/22/2021 No Yes 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 11/22/2021 No Yes Inactive Problems ICD-10 Code Description Active Date Inactive Date L89.623 Pressure ulcer of left heel, stage 3 11/22/2021 11/22/2021 Resolved Problems ICD-10 Code Description Active Date Resolved Date L24.A0 Irritant contact dermatitis due to friction or contact with body fluids, unspecified 11/22/2021 11/22/2021 L98.412 Non-pressure chronic ulcer of buttock with fat layer exposed 11/22/2021 11/22/2021 Electronic Signature(s) Signed: 04/22/2023 8:59:11 AM By: Allen Derry PA-C Entered By: Allen Derry on 04/22/2023 05:59:10 Pascale, [name] [id number] Page 7 of 11 -------------------------------------------------------------------------------- Progress Note Details Patient Name: Date of Service: [name] [facility] 04/22/2023 8:45 A M Medical Record Number: [id number] Patient Account Number: [id number] Date of Birth/Sex: Treating RN: Jan 17, 1956 (66 y.o. [name]) [name] Primary Care Provider: Dione Booze Other Clinician: Referring Provider: Treating Provider/Extender: [name] in Treatment: 73 Subjective Chief Complaint Information obtained from Patient Sacral, right heel, and left leg  ulcers History of Present Illness (HPI) 05/29/2021 this is a patient who presents today for initial inspection here in the clinic concerning wounds that he has over the right heel and the sacral region. Unfortunately the sacral wound is starting to spread off to the right gluteal location due to how he sits always leaning towards the right side in his chair. His wife is present she is the primary caregiver though she is not home with him all the time she does have to work. She does do an excellent job however it appears to be in trying to keep things under good control for him. The patient is not able to change positions himself nor walk by himself so he is pretty much at the mercy of the position he is putting when she is gone and this tends to be his chair which she sits and most of the day. Obviously this I think is the main culprit for what is going on currently. It was actually in January 2020 when the sacral wound started. It was in September 2022 when the wound started to spread more to the right gluteal location. Subsequently in August 2022 is when he had been in a skilled nursing facility and the heel started to give him trouble as well. That does not seem to be doing nearly as poorly as the sacral region. He was hospitalized in October 2022 secondary to sepsis and this was in regard to the foot and was sent to skilled nursing again he is now back at home. He did have a wound VAC for the sacral wound over the summer 2022 but being in and out of facilities this ended up getting sent back. The patient does have [facility] home health that comes out 1 time per week to help with care. His most recent hemoglobin A1c was 6.9 in August 2022. Patient's met past medical history includes generalized muscle weakness, bipolar disorder, diabetes mellitus type 2, hypertension, long-term  [place] Silos in Treatment: [address] Warrior, [name] ([id number])  [id number] Page 6 of 11 ICD-10 Encounter Code Description Active Date MDM Diagnosis L89.154 Pressure ulcer of sacral region, stage 4 11/22/2021 No Yes L89.613 Pressure ulcer of right heel, stage 3 11/22/2021 No Yes L97.822 Non-pressure chronic ulcer of other part of left lower leg with fat layer exposed10/07/2022 No Yes M62.81 Muscle weakness (generalized) 11/22/2021 No Yes F31.9 Bipolar disorder, unspecified 11/22/2021 No Yes E11.622 Type 2 diabetes mellitus with other skin ulcer 11/22/2021 No Yes I10 Essential (primary) hypertension 11/22/2021 No Yes Z79.01 Long term (current) use of anticoagulants 11/22/2021 No Yes 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 11/22/2021 No Yes Inactive Problems ICD-10 Code Description Active Date Inactive Date L89.623 Pressure ulcer of left heel, stage 3 11/22/2021 11/22/2021 Resolved Problems ICD-10 Code Description Active Date Resolved Date L24.A0 Irritant contact dermatitis due to friction or contact with body fluids, unspecified 11/22/2021 11/22/2021 L98.412 Non-pressure chronic ulcer of buttock with fat layer exposed 11/22/2021 11/22/2021 Electronic Signature(s) Signed: 04/22/2023 8:59:11 AM By: Allen Derry PA-C Entered By: Allen Derry on 04/22/2023 05:59:10 Pascale, [name] [id number] Page 7 of 11 -------------------------------------------------------------------------------- Progress Note Details Patient Name: Date of Service: [name] [facility] 04/22/2023 8:45 A M Medical Record Number: [id number] Patient Account Number: [id number] Date of Birth/Sex: Treating RN: Jan 17, 1956 (66 y.o. [name]) [name] Primary Care Provider: Dione Booze Other Clinician: Referring Provider: Treating Provider/Extender: [name] in Treatment: 73 Subjective Chief Complaint Information obtained from Patient Sacral, right heel, and left leg  ulcers History of Present Illness (HPI) 05/29/2021 this is a patient who presents today for initial inspection here in the clinic concerning wounds that he has over the right heel and the sacral region. Unfortunately the sacral wound is starting to spread off to the right gluteal location due to how he sits always leaning towards the right side in his chair. His wife is present she is the primary caregiver though she is not home with him all the time she does have to work. She does do an excellent job however it appears to be in trying to keep things under good control for him. The patient is not able to change positions himself nor walk by himself so he is pretty much at the mercy of the position he is putting when she is gone and this tends to be his chair which she sits and most of the day. Obviously this I think is the main culprit for what is going on currently. It was actually in January 2020 when the sacral wound started. It was in September 2022 when the wound started to spread more to the right gluteal location. Subsequently in August 2022 is when he had been in a skilled nursing facility and the heel started to give him trouble as well. That does not seem to be doing nearly as poorly as the sacral region. He was hospitalized in October 2022 secondary to sepsis and this was in regard to the foot and was sent to skilled nursing again he is now back at home. He did have a wound VAC for the sacral wound over the summer 2022 but being in and out of facilities this ended up getting sent back. The patient does have [facility] home health that comes out 1 time per week to help with care. His most recent hemoglobin A1c was 6.9 in August 2022. Patient's met past medical history includes generalized muscle weakness, bipolar disorder, diabetes mellitus type 2, hypertension, long-term  been about a month since have seen him as he was septic due to UTI and ended up in rehab following due to being weak. With that being said he does have a new wound on the left lower extremity which is secondary to having been placed in the wheelchair left they  are too long during the time he was in rehab according to his wife. 04-22-2023 upon evaluation today patient appears to be doing well currently in regard to his wounds which all actually appear to be doing a little bit better if not completely better with the ankle already being healed. With that being said he was in the hospital from October 11 through October 19 due to what is stated to be sepsis secondary to catheter associated UTI specifically Enterococcus is what was mentioned. With that being said he seems to be doing much better at this point. They do have a Hoyer at home which has been of great benefit and he is staying off of this more at home which I think has helped all of his wounds as well. Fortunately I do not see any signs of active infection locally or systemically at the moment which is great news and I do believe that he is doing well in that regard. Electronic Signature(s) Signed: 04/22/2023 9:25:44 AM By: Allen Derry PA-C Entered By: Allen Derry on 04/22/2023 06:25:44 -------------------------------------------------------------------------------- Physical Exam Details Patient Name: Date of Service: [name] [facility] 04/22/2023 8:45 A M Medical Record Number: [id number] Patient Account Number: [id number] Date of Birth/Sex: Treating RN: 04/29/1956 (66 y.o. [name]) [name] Primary Care Provider: Dione Booze Other Clinician: Referring Provider: Treating Provider/Extender: [name] in Treatment: 52 Constitutional Well-nourished and well-hydrated in no acute distress. Respiratory normal breathing without difficulty. MASTON, VANETTEN ([id number]) [id number] Page 4 of 11 Psychiatric this patient is able to make decisions and demonstrates good insight into disease process. Alert and Oriented x 3. pleasant and cooperative. Notes Patient's wounds again are showing signs of excellent improvement I am  actually very pleased with where we stand both in regard to the heel which is doing much better, the ankle which is healed, and the sacral area which is slow but still making progress here. Fortunately I think that we are on the right track and I would recommend that we continue as such. Electronic Signature(s) Signed: 04/22/2023 9:26:10 AM By: Allen Derry PA-C Entered By: Allen Derry on 04/22/2023 06:26:10 -------------------------------------------------------------------------------- Physician Orders Details Patient Name: Date of Service: [name] [facility] 04/22/2023 8:45 A M Medical Record Number: [id number] Patient Account Number: [id number] Date of Birth/Sex: Treating RN: 07-03-1955 (66 y.o. [name]) [name] Primary Care Provider: Dione Booze Other Clinician: Referring Provider: Treating Provider/Extender: [name] in Treatment: 69 Verbal / Phone Orders: No Diagnosis Coding ICD-10 Coding Code Description L89.154 Pressure ulcer of sacral region, stage 4 L89.613 Pressure ulcer of right heel, stage 3 L97.822 Non-pressure chronic ulcer of other part of left lower leg with fat layer exposed M62.81 Muscle weakness (generalized) F31.9 Bipolar disorder, unspecified E11.622 Type 2 diabetes mellitus with other skin ulcer I10 Essential (primary) hypertension Z79.01 Long term (current) use of anticoagulants 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 Home Health [facility] for wound care. May utilize formulary equivalent dressing for wound treatment orders unless otherwise specified. Home Health Nurse may visit PRN to address patients wound care needs. Beverly Gust [phone] **Please direct any NON-WOUND related issues/requests for orders  With: Medipore T - 50M Medipore H Soft Cloth Surgical T ape ape, 2x2 (in/yd) 3 x Per Week/30 Days WOUND #7: - Sacrum Wound Laterality: Cleanser: Normal Saline 1 x Per Day/30 Days Discharge Instructions: Wash your hands with soap and water. Remove old dressing, discard into plastic bag and place into trash. Cleanse the wound with Normal Saline prior to applying a clean dressing using gauze sponges, not tissues or cotton balls. Do not scrub or use excessive force. Pat dry using gauze sponges, not tissue or cotton balls. Cleanser: Soap and Water 1 x Per Day/30 Days Discharge  Instructions: Gently cleanse wound with antibacterial soap, rinse and pat dry prior to dressing wounds Topical: calmoseptine 1 x Per Day/30 Days Discharge Instructions: apply to excoriated areas / periwound Prim Dressing: Hydrofera Blue Ready Transfer Foam, 4x5 (in/in) 1 x Per Day/30 Days ary Discharge Instructions: Apply Hydrofera Blue Ready to wound bed as directed Secondary Dressing: ABD Pad 5x9 (in/in) 1 x Per Day/30 Days Discharge Instructions: Cover with ABD pad 1. I would recommend that we have the patient continue to monitor for any signs of infection or worsening. Based on what I see I do believe that he is actually doing quite well and I am very pleased I am hopeful that we can continue with the wound care measures as before and hopefully get this all healed up as quickly as possible I think that healed can definitely be healed fairly rapidly the sacral area is the primary aspect that we are trying to get close that is taking longest of all. 2. I am going to recommend as well that the patient continue with the [facility] which I think is doing well. 3 muscle can recommend that we continue with ABD pad to cover and secured with tape. We will see patient back for reevaluation in 1 week here in the clinic. If anything worsens or changes patient will contact our office for additional recommendations. Electronic Signature(s) Signed: 04/22/2023 9:26:47 AM By: Allen Derry PA-C Entered By: Allen Derry on 04/22/2023 06:26:47 -------------------------------------------------------------------------------- SuperBill Details Patient Name: Date of Service: [name] [facility] 04/22/2023 Medical Record Number: [id number] Patient Account Number: [id number] Date of Birth/Sex: Treating RN: 21-Oct-1955 (66 y.o. [name]) [name] Primary Care Provider: Dione Booze Other Clinician: Referring Provider: Treating Provider/Extender: [name] in Treatment:  [address] Diagnosis [address], [name] [id number] Page 11 of 11 ICD-10 Codes Code Description L89.154 Pressure ulcer of sacral region, stage 4 L89.613 Pressure ulcer of right heel, stage 3 L97.822 Non-pressure chronic ulcer of other part of left lower leg with fat layer exposed M62.81 Muscle weakness (generalized) F31.9 Bipolar disorder, unspecified E11.622 Type 2 diabetes mellitus with other skin ulcer I10 Essential (primary) hypertension Z79.01 Long term (current) use of anticoagulants 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 Facility Procedures : CPT4 Code: [id number] Description: 99213 - WOUND CARE VISIT-LEV 3 EST PT Modifier: Quantity: 1 Physician Procedures : CPT4 Code Description Modifier [id number] 99213 - WC PHYS LEVEL 3 - EST PT ICD-10 Diagnosis Description L89.154 Pressure ulcer of sacral region, stage 4 L89.613 Pressure ulcer of right heel, stage 3 L97.822 Non-pressure chronic ulcer of other part of left  lower leg with fat layer exposed M62.81 Muscle weakness (generalized) Quantity: 1 Electronic Signature(s) Signed: 04/22/2023 1:55:37 PM By: [name] RN Signed: 04/22/2023 4:48:34 PM By: Allen Derry PA-C Previous Signature: 04/22/2023 9:26:59 AM Version By: Allen Derry PA-C Entered By: [name] on 04/22/2023 10:55:36  to patient's Primary Care Physician. **If current dressing causes regression in wound condition, may D/C ordered dressing product/s and apply  Normal Saline Moist Dressing daily until next Wound Healing Center or Other MD appointment. **Notify Wound Healing Center of regression in wound condition at [phone]. Bathing/ Shower/ Hygiene No tub bath. Anesthetic (Use 'Patient Medications' Section for Anesthetic Order Entry) Lidocaine applied to wound bed Edema Control - Lymphedema / Segmental Compressive Device / Other Elevate, Exercise Daily and A void Standing for Long Periods of Time. Elevate legs to the level of the heart and pump ankles as often as possible Elevate leg(s) parallel to the floor when sitting. DO YOUR BEST to sleep in the bed at night. DO NOT sleep in your recliner. Long hours of sitting in a recliner leads to swelling of the legs and/or potential wounds on your backside. Off-Loading THEOPLIS, HAUGER ([id number]) [id number] Page 5 of 11 Gel wheelchair cushion Low air-loss mattress (Group 2) - Sleep in bed every night. Turn and reposition every 2 hours - keep pressure off of the sacrum and heels wounds Other: - PRAFO boot in bed keep pressure off of sacrum/gluteus and heels- Additional Orders / Instructions Follow Nutritious Diet and Increase Protein Intake Wound Treatment Wound #5 - Calcaneus Wound Laterality: Right Cleanser: Normal Saline 3 x Per Week/30 Days Discharge Instructions: Wash your hands with soap and water. Remove old dressing, discard into plastic bag and place into trash. Cleanse the wound with Normal Saline prior to applying a clean dressing using gauze sponges, not tissues or cotton balls. Do not scrub or use excessive force. Pat dry using gauze sponges, not tissue or cotton balls. Cleanser: Soap and Water 3 x Per Week/30 Days Discharge Instructions: Gently cleanse wound with antibacterial soap, rinse and pat dry prior to dressing wounds Prim Dressing: Hydrofera Blue Ready Transfer Foam, 2.5x2.5 (in/in) ary 3 x Per Week/30 Days Discharge Instructions: cut to  size of wound Secondary Dressing: Gauze 3 x Per Week/30 Days Discharge Instructions: As directed: dry, moistened with saline or moistened with Dakins Solution Secured With: Medipore T - 71M Medipore H Soft Cloth Surgical T ape ape, 2x2 (in/yd) 3 x Per Week/30 Days Wound #7 - Sacrum Cleanser: Normal Saline 1 x Per Day/30 Days Discharge Instructions: Wash your hands with soap and water. Remove old dressing, discard into plastic bag and place into trash. Cleanse the wound with Normal Saline prior to applying a clean dressing using gauze sponges, not tissues or cotton balls. Do not scrub or use excessive force. Pat dry using gauze sponges, not tissue or cotton balls. Cleanser: Soap and Water 1 x Per Day/30 Days Discharge Instructions: Gently cleanse wound with antibacterial soap, rinse and pat dry prior to dressing wounds Topical: calmoseptine 1 x Per Day/30 Days Discharge Instructions: apply to excoriated areas / periwound Prim Dressing: Hydrofera Blue Ready Transfer Foam, 4x5 (in/in) 1 x Per Day/30 Days ary Discharge Instructions: Apply Hydrofera Blue Ready to wound bed as directed Secondary Dressing: ABD Pad 5x9 (in/in) 1 x Per Day/30 Days Discharge Instructions: Cover with ABD pad Electronic Signature(s) Signed: 04/22/2023 4:48:34 PM By: Allen Derry PA-C Signed: 04/28/2023 8:00:25 AM By: [name] RN Entered By: [name] on 04/22/2023 06:19:41 -------------------------------------------------------------------------------- Problem List Details Patient Name: Date of Service: [name] [facility] 04/22/2023 8:45 A M Medical Record Number: [id number] Patient Account Number: [id number] Date of Birth/Sex: Treating RN: 09/03/1955 (66 y.o. [name] Primary Care Provider: Dione Booze Other Clinician: Referring Provider: Treating Provider/Extender: Betsey Amen,  to patient's Primary Care Physician. **If current dressing causes regression in wound condition, may D/C ordered dressing product/s and apply  Normal Saline Moist Dressing daily until next Wound Healing Center or Other MD appointment. **Notify Wound Healing Center of regression in wound condition at [phone]. Bathing/ Shower/ Hygiene No tub bath. Anesthetic (Use 'Patient Medications' Section for Anesthetic Order Entry) Lidocaine applied to wound bed Edema Control - Lymphedema / Segmental Compressive Device / Other Elevate, Exercise Daily and A void Standing for Long Periods of Time. Elevate legs to the level of the heart and pump ankles as often as possible Elevate leg(s) parallel to the floor when sitting. DO YOUR BEST to sleep in the bed at night. DO NOT sleep in your recliner. Long hours of sitting in a recliner leads to swelling of the legs and/or potential wounds on your backside. Off-Loading THEOPLIS, HAUGER ([id number]) [id number] Page 5 of 11 Gel wheelchair cushion Low air-loss mattress (Group 2) - Sleep in bed every night. Turn and reposition every 2 hours - keep pressure off of the sacrum and heels wounds Other: - PRAFO boot in bed keep pressure off of sacrum/gluteus and heels- Additional Orders / Instructions Follow Nutritious Diet and Increase Protein Intake Wound Treatment Wound #5 - Calcaneus Wound Laterality: Right Cleanser: Normal Saline 3 x Per Week/30 Days Discharge Instructions: Wash your hands with soap and water. Remove old dressing, discard into plastic bag and place into trash. Cleanse the wound with Normal Saline prior to applying a clean dressing using gauze sponges, not tissues or cotton balls. Do not scrub or use excessive force. Pat dry using gauze sponges, not tissue or cotton balls. Cleanser: Soap and Water 3 x Per Week/30 Days Discharge Instructions: Gently cleanse wound with antibacterial soap, rinse and pat dry prior to dressing wounds Prim Dressing: Hydrofera Blue Ready Transfer Foam, 2.5x2.5 (in/in) ary 3 x Per Week/30 Days Discharge Instructions: cut to  size of wound Secondary Dressing: Gauze 3 x Per Week/30 Days Discharge Instructions: As directed: dry, moistened with saline or moistened with Dakins Solution Secured With: Medipore T - 71M Medipore H Soft Cloth Surgical T ape ape, 2x2 (in/yd) 3 x Per Week/30 Days Wound #7 - Sacrum Cleanser: Normal Saline 1 x Per Day/30 Days Discharge Instructions: Wash your hands with soap and water. Remove old dressing, discard into plastic bag and place into trash. Cleanse the wound with Normal Saline prior to applying a clean dressing using gauze sponges, not tissues or cotton balls. Do not scrub or use excessive force. Pat dry using gauze sponges, not tissue or cotton balls. Cleanser: Soap and Water 1 x Per Day/30 Days Discharge Instructions: Gently cleanse wound with antibacterial soap, rinse and pat dry prior to dressing wounds Topical: calmoseptine 1 x Per Day/30 Days Discharge Instructions: apply to excoriated areas / periwound Prim Dressing: Hydrofera Blue Ready Transfer Foam, 4x5 (in/in) 1 x Per Day/30 Days ary Discharge Instructions: Apply Hydrofera Blue Ready to wound bed as directed Secondary Dressing: ABD Pad 5x9 (in/in) 1 x Per Day/30 Days Discharge Instructions: Cover with ABD pad Electronic Signature(s) Signed: 04/22/2023 4:48:34 PM By: Allen Derry PA-C Signed: 04/28/2023 8:00:25 AM By: [name] RN Entered By: [name] on 04/22/2023 06:19:41 -------------------------------------------------------------------------------- Problem List Details Patient Name: Date of Service: [name] [facility] 04/22/2023 8:45 A M Medical Record Number: [id number] Patient Account Number: [id number] Date of Birth/Sex: Treating RN: 09/03/1955 (66 y.o. [name] Primary Care Provider: Dione Booze Other Clinician: Referring Provider: Treating Provider/Extender: Betsey Amen,  been about a month since have seen him as he was septic due to UTI and ended up in rehab following due to being weak. With that being said he does have a new wound on the left lower extremity which is secondary to having been placed in the wheelchair left they  are too long during the time he was in rehab according to his wife. 04-22-2023 upon evaluation today patient appears to be doing well currently in regard to his wounds which all actually appear to be doing a little bit better if not completely better with the ankle already being healed. With that being said he was in the hospital from October 11 through October 19 due to what is stated to be sepsis secondary to catheter associated UTI specifically Enterococcus is what was mentioned. With that being said he seems to be doing much better at this point. They do have a Hoyer at home which has been of great benefit and he is staying off of this more at home which I think has helped all of his wounds as well. Fortunately I do not see any signs of active infection locally or systemically at the moment which is great news and I do believe that he is doing well in that regard. Electronic Signature(s) Signed: 04/22/2023 9:25:44 AM By: Allen Derry PA-C Entered By: Allen Derry on 04/22/2023 06:25:44 -------------------------------------------------------------------------------- Physical Exam Details Patient Name: Date of Service: [name] [facility] 04/22/2023 8:45 A M Medical Record Number: [id number] Patient Account Number: [id number] Date of Birth/Sex: Treating RN: 04/29/1956 (66 y.o. [name]) [name] Primary Care Provider: Dione Booze Other Clinician: Referring Provider: Treating Provider/Extender: [name] in Treatment: 52 Constitutional Well-nourished and well-hydrated in no acute distress. Respiratory normal breathing without difficulty. MASTON, VANETTEN ([id number]) [id number] Page 4 of 11 Psychiatric this patient is able to make decisions and demonstrates good insight into disease process. Alert and Oriented x 3. pleasant and cooperative. Notes Patient's wounds again are showing signs of excellent improvement I am  actually very pleased with where we stand both in regard to the heel which is doing much better, the ankle which is healed, and the sacral area which is slow but still making progress here. Fortunately I think that we are on the right track and I would recommend that we continue as such. Electronic Signature(s) Signed: 04/22/2023 9:26:10 AM By: Allen Derry PA-C Entered By: Allen Derry on 04/22/2023 06:26:10 -------------------------------------------------------------------------------- Physician Orders Details Patient Name: Date of Service: [name] [facility] 04/22/2023 8:45 A M Medical Record Number: [id number] Patient Account Number: [id number] Date of Birth/Sex: Treating RN: 07-03-1955 (66 y.o. [name]) [name] Primary Care Provider: Dione Booze Other Clinician: Referring Provider: Treating Provider/Extender: [name] in Treatment: 69 Verbal / Phone Orders: No Diagnosis Coding ICD-10 Coding Code Description L89.154 Pressure ulcer of sacral region, stage 4 L89.613 Pressure ulcer of right heel, stage 3 L97.822 Non-pressure chronic ulcer of other part of left lower leg with fat layer exposed M62.81 Muscle weakness (generalized) F31.9 Bipolar disorder, unspecified E11.622 Type 2 diabetes mellitus with other skin ulcer I10 Essential (primary) hypertension Z79.01 Long term (current) use of anticoagulants 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 Home Health [facility] for wound care. May utilize formulary equivalent dressing for wound treatment orders unless otherwise specified. Home Health Nurse may visit [facility] to address patients wound care needs. Beverly Gust [phone] **Please direct any NON-WOUND related issues/requests for orders

## 2023-04-22 NOTE — Progress Notes (Addendum)
Tissue) Exposed: Yes Tendon Exposed: No Muscle Exposed: No Joint Exposed: No Bone Exposed: No Treatment Notes Wound #7 (Sacrum) Cleanser Normal Saline Discharge Instruction: Wash your hands with soap and water. Remove old dressing, discard into plastic bag and place into trash. Cleanse the wound with Normal Saline prior to applying a clean dressing using gauze sponges, not tissues or cotton balls. Do not scrub or use excessive force. Pat dry using gauze sponges, not tissue or cotton balls. Soap and Water Discharge Instruction: Gently cleanse wound with antibacterial soap, rinse and pat dry prior to dressing wounds Peri-Wound Care Topical calmoseptine Discharge Instruction: apply to excoriated areas / periwound Primary Dressing Hydrofera Blue Ready Transfer Foam, 4x5 (in/in) Discharge Instruction: Apply Hydrofera Blue Ready to wound bed as directed Secondary Dressing ABD Pad 5x9 (in/in) Discharge Instruction: Cover with ABD pad Secured With Compression Wrap Compression Stockings Add-Ons Electronic Signature(s) Signed: 04/28/2023 8:00:25 AM By: [name] RN Grell, Peace [phone], Carrie RN [phone].pdf Page 10 of 11 Signed: 04/28/2023 8:00:25 AM By: Margaret Pyle By: [name] on 04/22/2023 06:12:14 -------------------------------------------------------------------------------- Wound Assessment Details Patient Name: Date of Service: [name] [facility] 04/22/2023 8:45 A M Medical  Record Number: [id number] Patient Account Number: [id number] Date of Birth/Sex: Treating RN: [birth year] (67 y.o. [name]) [name] Primary Care Patsye Sullivant: Dione Booze Other Clinician: Referring Viviana Trimble: Treating Euleta Belson/Extender: [name] in Treatment: 73 Wound Status Wound Number: 9 Primary Diabetic Wound/Ulcer of the Lower Extremity Etiology: Wound Location: Left, Lateral Lower Leg Wound Open Wounding Event: Gradually Appeared Status: Date Acquired: 02/24/2023 Comorbid Anemia, Sleep Apnea, Coronary Artery Disease, Deep Vein Weeks Of Treatment: 3 History: Thrombosis, Hypertension, Type II Diabetes, History of pressure Clustered Wound: No wounds, Neuropathy Photos Wound Measurements Length: (cm) Width: (cm) Depth: (cm) Area: (cm) Volume: (cm) 0 % Reduction in Area: 100% 0 % Reduction in Volume: 100% 0 Epithelialization: Large (67-100%) 0 Tunneling: No 0 Undermining: No Wound Description Classification: Grade 1 Exudate Amount: None Present Foul Odor After Cleansing: No Slough/Fibrino No Wound Bed Granulation Amount: None Present (0%) Exposed Structure Necrotic Amount: None Present (0%) Fascia Exposed: No Fat Layer (Subcutaneous Tissue) Exposed: No Tendon Exposed: No Muscle Exposed: No Joint Exposed: No Bone Exposed: No Electronic Signature(s) Signed: 04/28/2023 8:00:25 AM By: [name] RN Entered By: [name] on 04/22/2023 06:13:02 Crosley, [name] ([id number]) [id number] Page 11 of 11 -------------------------------------------------------------------------------- Vitals Details Patient Name: Date of Service: [name] [facility] 04/22/2023 8:45 A M Medical Record Number: [id number] Patient Account Number: [id number] Date of Birth/Sex: Treating RN: 04-Sep-1955 (67 y.o. [name]) [name] Primary Care Kajal Scalici: Dione Booze Other Clinician: Referring Mikeala Girdler: Treating Terrion Gencarelli/Extender:  [name] in Treatment: 77 Vital Signs Time Taken: 09:09 Temperature (F): 97.5 Height (in): 66 Pulse (bpm): 103 Weight (lbs): 279 Respiratory Rate (breaths/min): 18 Body Mass Index (BMI): 45 Blood Pressure (mmHg): 116/78 Reference Range: 80 - 120 mg / dl Electronic Signature(s) Signed: 04/28/2023 8:00:25 AM By: [name] RN Entered By: [name] on 04/22/2023 06:09:56  0 Simple Wound Cleansing - one wound X- 2 5 Complex Wound Cleansing - multiple wounds X- 1 5 Wound Imaging (photographs - any number of wounds) []  - 0 Wound Tracing (instead of photographs) []  - 0 Simple Wound Measurement - one wound X- 2 5 Complex Wound Measurement - multiple wounds INTERVENTIONS - Wound Dressings X - Small Wound Dressing one or multiple wounds 2 10 []  - 0 Medium Wound Dressing one or multiple wounds []  - 0 Large Wound Dressing one or multiple wounds []  - 0 Application of Medications - topical []  - 0 Application of Medications - injection INTERVENTIONS - Miscellaneous []  - 0 External ear exam Dipierro, [name] ([id number]) [id number] Page 3 of 11 []  - 0 Specimen Collection (cultures, biopsies, blood, body fluids, etc.) []  - 0 Specimen(s) / Culture(s) sent or taken to Lab for analysis []  - 0 Patient Transfer (multiple staff / Michiel Sites Lift / Similar devices) []  - 0 Simple Staple / Suture removal (25 or less) []  - 0 Complex Staple / Suture removal (26 or more) []  - 0 Hypo / Hyperglycemic Management (close monitor of Blood Glucose) []  - 0 Ankle / Brachial Index (ABI) - do not check if billed separately X- 1 5 Vital Signs Has the patient been seen at the hospital within the last three years: Yes Total Score: 105 Level Of Care: New/Established - Level 3 Electronic Signature(s) Signed: 04/28/2023 8:00:25 AM By: [name] RN Entered By: [name] on 04/22/2023 10:55:20 -------------------------------------------------------------------------------- Encounter Discharge Information Details Patient Name: Date of Service: [name] [facility] 04/22/2023  8:45 A M Medical Record Number: [id number] Patient Account Number: [id number] Date of Birth/Sex: Treating RN: September 30, 1955 (67 y.o. [name] Primary Care Irianna Gilday: Dione Booze Other Clinician: Referring Amyla Heffner: Treating Vaeda Westall/Extender: [name] in Treatment: 76 Encounter Discharge Information Items Discharge Condition: Stable Ambulatory Status: Wheelchair Discharge Destination: Home Transportation: Private Auto Accompanied By: wife Schedule Follow-up Appointment: Yes Clinical Summary of Care: Electronic Signature(s) Signed: 04/22/2023 1:56:19 PM By: [name] RN Entered By: [name] on 04/22/2023 10:56:19 -------------------------------------------------------------------------------- Lower Extremity Assessment Details Patient Name: Date of Service: [name] [facility] 04/22/2023 8:45 [address] [name] ([id number]) [id number] Page 4 of 11 Medical Record Number: [id number] Patient Account Number: [id number] Date of Birth/Sex: Treating RN: [birth year] (67 y.o. [name]) [name] Primary Care Lynford Espinoza: Dione Booze Other Clinician: Referring Jaid Quirion: Treating Farron Watrous/Extender: [name] in Treatment: 30 Electronic Signature(s) Signed: 04/28/2023 8:00:25 AM By: [name] RN Entered By: [name] on 04/22/2023 06:19:14 -------------------------------------------------------------------------------- Multi Wound Chart Details Patient Name: Date of Service: [name] [facility] 04/22/2023 8:45 A M Medical Record Number: [id number] Patient Account Number: [id number] Date of Birth/Sex: Treating RN: 24-Jul-1955 (67 y.o. [name]) [name] Primary Care Kamela Blansett: Dione Booze Other Clinician: Referring Pranish Akhavan: Treating Channel Papandrea/Extender: [name] in Treatment: 38 Vital Signs Height(in): 66 Pulse(bpm): 103 Weight(lbs): 279 Blood  Pressure(mmHg): 116/78 Body Mass Index(BMI): 45 Temperature(F): 97.5 Respiratory Rate(breaths/min): 18 [5:Photos:] Right Calcaneus Sacrum Left, Lateral Lower Leg Wound Location: Pressure Injury Pressure Injury Gradually Appeared Wounding 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  Education Topics Provided Pressure: Handouts: Pressure Injury: Prevention and Offloading Methods: Explain/Verbal Responses: State content correctly Electronic Signature(s) Signed: 04/28/2023 8:00:25 AM By: [name] RN Entered By: [name] on  04/22/2023 06:13:56 -------------------------------------------------------------------------------- Wound Assessment Details Patient Name: Date of Service: [name] [facility] 04/22/2023 8:45 A M Medical Record Number: [id number] Patient Account Number: [id number] Date of Birth/Sex: Treating RN: [birth year] (67 y.o. [name]) [name] Primary Care Jaking Thayer: Dione Booze Other Clinician: Referring Gaytha Raybourn: Treating Arlissa Monteverde/Extender: [name] in Treatment: 73 Wound Status Wound Number: 5 Primary Pressure Ulcer Etiology: Wound Location: Right Calcaneus Wound Open Wounding Event: Pressure Injury Status: Date Acquired: 07/01/2021 Comorbid Anemia, Sleep Apnea, Coronary Artery Disease, Deep Vein Weeks Of Treatment: 73 History: Thrombosis, Hypertension, Type II Diabetes, History of pressure Clustered Wound: No wounds, Neuropathy Photos Wound Measurements Length: (cm) 2 Width: (cm) 2 Depth: (cm) 0.2 Area: (cm) 3.142 Volume: (cm) 0.628 % Reduction in Area: 24.1% % Reduction in Volume: -51.7% Epithelialization: Large (67-100%) Tunneling: No Undermining: No Wound Description Classification: Category/Stage III Exudate Amount: Medium Exudate Type: Serosanguineous Guzzetta, Harbert ([id number]) Exudate Color: red, brown Foul Odor After Cleansing: No Slough/Fibrino Yes [id number] Page 8 of 11 Wound Bed Granulation Amount: Small (1-33%) Exposed Structure Granulation Quality: Red Fascia Exposed: No Necrotic Amount: Large (67-100%) Fat Layer (Subcutaneous Tissue) Exposed: Yes Necrotic Quality: Adherent Slough Tendon Exposed: No Muscle Exposed: No Joint Exposed: No Bone Exposed: No Treatment Notes Wound #5 (Calcaneus) Wound Laterality: Right Cleanser Normal Saline Discharge Instruction: Wash your hands with soap and water. Remove old dressing, discard into plastic bag and place into trash. Cleanse the wound with  Normal Saline prior to applying a clean dressing using gauze sponges, not tissues or cotton balls. Do not scrub or use excessive force. Pat dry using gauze sponges, not tissue or cotton balls. Soap and Water Discharge Instruction: Gently cleanse wound with antibacterial soap, rinse and pat dry prior to dressing wounds Peri-Wound Care Topical Primary Dressing Hydrofera Blue Ready Transfer Foam, 2.5x2.5 (in/in) Discharge Instruction: cut to size of wound Secondary Dressing Gauze Discharge Instruction: As directed: dry, moistened with saline or moistened with Dakins Solution Secured With Medipore T - 94M Medipore H Soft Cloth Surgical T ape ape, 2x2 (in/yd) Compression Wrap Compression Stockings Add-Ons Electronic Signature(s) Signed: 04/28/2023 8:00:25 AM By: [name] RN Entered By: [name] on 04/22/2023 06:11:46 -------------------------------------------------------------------------------- Wound Assessment Details Patient Name: Date of Service: [name] [facility] 04/22/2023 8:45 A M Medical Record Number: [id number] Patient Account Number: [id number] Date of Birth/Sex: Treating RN: [birth year] (67 y.o. [name] Primary Care Tej Murdaugh: Dione Booze Other Clinician: Referring Krystena Reitter: Treating Cedar Ditullio/Extender: [name] in Treatment: 73 Wound Status Wound Number: 7 Primary Pressure Ulcer Etiology: Wound Location: Sacrum Wound Open Wounding Event: Pressure Injury Status: Date Acquired: 07/01/2021 Comorbid Anemia, Sleep Apnea, Coronary Artery Disease, Deep Vein Weeks Of Treatment: [address] Xzavior ([id number]) [id number] Page 9 of 11 Weeks Of Treatment: 73 History: Thrombosis, Hypertension, Type II Diabetes, History of pressure Clustered Wound: No wounds, Neuropathy Photos Wound Measurements Length: (cm) 3 Width: (cm) 3 Depth: (cm) 3 Area: (cm) 7.069 Volume: (cm) 21.206 % Reduction in  Area: -125% % Reduction in Volume: -125% Epithelialization: None Tunneling: No Undermining: No Wound Description Classification: Category/Stage IV Exudate Amount: Medium Exudate Type: Serosanguineous Exudate Color: red, brown Foul Odor After Cleansing: No Slough/Fibrino No Wound Bed Granulation Amount: Large (67-100%) Exposed Structure Granulation Quality: Red Fascia Exposed: No Necrotic Amount: None Present (0%) Fat Layer (Subcutaneous  Tissue) Exposed: Yes Tendon Exposed: No Muscle Exposed: No Joint Exposed: No Bone Exposed: No Treatment Notes Wound #7 (Sacrum) Cleanser Normal Saline Discharge Instruction: Wash your hands with soap and water. Remove old dressing, discard into plastic bag and place into trash. Cleanse the wound with Normal Saline prior to applying a clean dressing using gauze sponges, not tissues or cotton balls. Do not scrub or use excessive force. Pat dry using gauze sponges, not tissue or cotton balls. Soap and Water Discharge Instruction: Gently cleanse wound with antibacterial soap, rinse and pat dry prior to dressing wounds Peri-Wound Care Topical calmoseptine Discharge Instruction: apply to excoriated areas / periwound Primary Dressing Hydrofera Blue Ready Transfer Foam, 4x5 (in/in) Discharge Instruction: Apply Hydrofera Blue Ready to wound bed as directed Secondary Dressing ABD Pad 5x9 (in/in) Discharge Instruction: Cover with ABD pad Secured With Compression Wrap Compression Stockings Add-Ons Electronic Signature(s) Signed: 04/28/2023 8:00:25 AM By: [name] RN Grell, Peace [phone], Carrie RN [phone].pdf Page 10 of 11 Signed: 04/28/2023 8:00:25 AM By: Margaret Pyle By: [name] on 04/22/2023 06:12:14 -------------------------------------------------------------------------------- Wound Assessment Details Patient Name: Date of Service: [name] [facility] 04/22/2023 8:45 A M Medical  Record Number: [id number] Patient Account Number: [id number] Date of Birth/Sex: Treating RN: [birth year] (67 y.o. [name]) [name] Primary Care Patsye Sullivant: Dione Booze Other Clinician: Referring Viviana Trimble: Treating Euleta Belson/Extender: [name] in Treatment: 73 Wound Status Wound Number: 9 Primary Diabetic Wound/Ulcer of the Lower Extremity Etiology: Wound Location: Left, Lateral Lower Leg Wound Open Wounding Event: Gradually Appeared Status: Date Acquired: 02/24/2023 Comorbid Anemia, Sleep Apnea, Coronary Artery Disease, Deep Vein Weeks Of Treatment: 3 History: Thrombosis, Hypertension, Type II Diabetes, History of pressure Clustered Wound: No wounds, Neuropathy Photos Wound Measurements Length: (cm) Width: (cm) Depth: (cm) Area: (cm) Volume: (cm) 0 % Reduction in Area: 100% 0 % Reduction in Volume: 100% 0 Epithelialization: Large (67-100%) 0 Tunneling: No 0 Undermining: No Wound Description Classification: Grade 1 Exudate Amount: None Present Foul Odor After Cleansing: No Slough/Fibrino No Wound Bed Granulation Amount: None Present (0%) Exposed Structure Necrotic Amount: None Present (0%) Fascia Exposed: No Fat Layer (Subcutaneous Tissue) Exposed: No Tendon Exposed: No Muscle Exposed: No Joint Exposed: No Bone Exposed: No Electronic Signature(s) Signed: 04/28/2023 8:00:25 AM By: [name] RN Entered By: [name] on 04/22/2023 06:13:02 Crosley, [name] ([id number]) [id number] Page 11 of 11 -------------------------------------------------------------------------------- Vitals Details Patient Name: Date of Service: [name] [facility] 04/22/2023 8:45 A M Medical Record Number: [id number] Patient Account Number: [id number] Date of Birth/Sex: Treating RN: 04-Sep-1955 (67 y.o. [name]) [name] Primary Care Kajal Scalici: Dione Booze Other Clinician: Referring Mikeala Girdler: Treating Terrion Gencarelli/Extender:  [name] in Treatment: 77 Vital Signs Time Taken: 09:09 Temperature (F): 97.5 Height (in): 66 Pulse (bpm): 103 Weight (lbs): 279 Respiratory Rate (breaths/min): 18 Body Mass Index (BMI): 45 Blood Pressure (mmHg): 116/78 Reference Range: 80 - 120 mg / dl Electronic Signature(s) Signed: 04/28/2023 8:00:25 AM By: [name] RN Entered By: [name] on 04/22/2023 06:09:56  AMILLION, HELDERMAN ([id number]) [id number] Page 1 of 11 Visit Report for 04/22/2023 Arrival Information Details Patient Name: Date of Service: [name] [facility] 04/22/2023 8:45 A M Medical Record Number: [id number] Patient Account Number: [id number] Date of Birth/Sex: Treating RN: Nov 16, 1955 (67 y.o. [name]) [name] Primary Care Arzella Rehmann: Dione Booze Other Clinician: Referring Brion Sossamon: Treating Pauleen Goleman/Extender: [name] in Treatment: 24 Visit Information History Since Last Visit Added or deleted any medications: No Patient Arrived: Wheel Chair Any new allergies or adverse reactions: No Arrival Time: 09:08 Had a fall or experienced change in No Accompanied By: wife activities of daily living that may affect Transfer Assistance: None risk of falls: Patient Identification Verified: Yes Signs or symptoms of abuse/neglect since last visito No Secondary Verification Process Completed: Yes Hospitalized since last visit: No Patient Requires Transmission-Based Precautions: No Implantable device outside of the clinic excluding No Patient Has Alerts: Yes cellular tissue based products placed in the center Patient Alerts: Patient on Blood Thinner since last visit: Has Dressing in Place as Prescribed: Yes Pain Present Now: No Electronic Signature(s) Signed: 04/28/2023 8:00:25 AM By: [name] RN Entered By: [name] on 04/22/2023 06:09:31 -------------------------------------------------------------------------------- Clinic Level of Care Assessment Details Patient Name: Date of Service: [name] [facility] 04/22/2023 8:45 A M Medical Record Number: [id number] Patient Account Number: [id number] Date of Birth/Sex: Treating RN: February 10, 1956 (67 y.o. [name]) [name] Primary Care Fae Blossom: Dione Booze Other Clinician: Referring Dalyce Renne: Treating Daelen Belvedere/Extender: [name] in  Treatment: 73 Clinic Level of Care Assessment Items TOOL 4 Quantity Score X- 1 0 Use when only an EandM is performed on FOLLOW-UP visit ASSESSMENTS - Nursing Assessment / Reassessment X- 1 10 Reassessment of Co-morbidities (includes updates in patient status) X- 1 5 Reassessment of Adherence to Treatment Plan Matte, [name] ([id number]) [id number] Page 2 of 11 ASSESSMENTS - Wound and Skin A ssessment / Reassessment []  - Simple Wound Assessment / Reassessment - one wound 0 X- 3 5 Complex Wound Assessment / Reassessment - multiple wounds []  - 0 Dermatologic / Skin Assessment (not related to wound area) ASSESSMENTS - Focused Assessment []  - 0 Circumferential Edema Measurements - multi extremities []  - 0 Nutritional Assessment / Counseling / Intervention []  - 0 Lower Extremity Assessment (monofilament, tuning fork, pulses) []  - 0 Peripheral Arterial Disease Assessment (using hand held doppler) ASSESSMENTS - Ostomy and/or Continence Assessment and Care []  - 0 Incontinence Assessment and Management []  - 0 Ostomy Care Assessment and Management (repouching, etc.) PROCESS - Coordination of Care X - Simple Patient / Family Education for ongoing care 1 15 []  - 0 Complex (extensive) Patient / Family Education for ongoing care []  - 0 Staff obtains Chiropractor, Records, T Results / Process Orders est []  - 0 Staff telephones HHA, Nursing Homes / Clarify orders / etc []  - 0 Routine Transfer to another Facility (non-emergent condition) []  - 0 Routine Hospital Admission (non-emergent condition) []  - 0 New Admissions / Manufacturing engineer / Ordering NPWT Apligraf, etc. , []  - 0 Emergency Hospital Admission (emergent condition) X- 1 10 Simple Discharge Coordination []  - 0 Complex (extensive) Discharge Coordination PROCESS - Special Needs []  - 0 Pediatric / Minor Patient Management []  - 0 Isolation Patient Management []  - 0 Hearing / Language  / Visual special needs []  - 0 Assessment of Community assistance (transportation, D/C planning, etc.) []  - 0 Additional assistance / Altered mentation []  - 0 Support Surface(s) Assessment (bed, cushion, seat, etc.) INTERVENTIONS - Wound Cleansing / Measurement []  -

## 2023-04-28 NOTE — Progress Notes (Signed)
0 Simple Wound Cleansing - one wound X- 3 5 Complex Wound Cleansing - multiple wounds X- 1 5 Wound Imaging (photographs - any number of wounds) []  - 0 Wound Tracing (instead of photographs) []  - 0 Simple Wound Measurement - one wound X- 3 5 Complex Wound Measurement - multiple wounds INTERVENTIONS - Wound Dressings X - Small Wound Dressing one or multiple wounds 3 10 []  - 0 Medium Wound Dressing one or multiple wounds []  - 0 Large Wound Dressing one or multiple wounds []  - 0 Application of Medications - topical []  - 0 Application of Medications - injection INTERVENTIONS - Miscellaneous []  - 0 External ear exam [name], [name] [name] Longs ([id number]) [id number] Page 3 of 10 []  - 0 Specimen Collection (cultures, biopsies, blood, body fluids, etc.) []  - 0 Specimen(s) / Culture(s) sent or taken to Lab for analysis []  - 0 Patient Transfer (multiple staff / Michiel Sites Lift / Similar devices) []  - 0 Simple Staple / Suture removal (25 or less) []  - 0 Complex Staple / Suture removal (26 or more) []  - 0 Hypo / Hyperglycemic Management (close monitor of Blood Glucose) []  - 0 Ankle / Brachial Index (ABI) - do not check if billed separately X- 1 5 Vital Signs Has the patient been seen at the hospital within the last three years: Yes Total Score: 125 Level Of Care: New/Established - Level 4 Electronic Signature(s) Signed: 04/28/2023 8:01:26 AM By: [name] RN Entered By: [name] on 04/01/2023 06:50:59 -------------------------------------------------------------------------------- Encounter Discharge Information Details Patient Name: Date of Service: [name] [name] [name] [facility] 04/01/2023  8:45 A M Medical Record Number: [id number] Patient Account Number: [id number] Date of Birth/Sex: Treating RN: [birth year] (66 y.o. [name] [name] [name] Primary Care [name] [name] [name]: [name] [name] [name] Other Clinician: Referring [name] [name] [name]: Treating [name] [name] [name]/Extender: [name] [name] [name] in Treatment: 91 Encounter Discharge Information Items Discharge Condition: Stable Ambulatory Status: Wheelchair Discharge Destination: Home Transportation: Private Auto Accompanied By: wife Schedule Follow-up Appointment: Yes Clinical Summary of Care: Electronic Signature(s) Signed: 04/01/2023 9:53:16 AM By: [name] RN Entered By: [name] on 04/01/2023 06:53:16 -------------------------------------------------------------------------------- Lower Extremity Assessment Details Patient Name: Date of Service: [name] [name] [name] [facility] 04/01/2023 8:45 [address] [name] [name] Longs ([id number]) [id number] Page 4 of 10 Medical Record Number: [id number] Patient Account Number: [id number] Date of Birth/Sex: Treating RN: 1956-04-06 (66 y.o. [name] [name] [name]) [name] Primary Care [name]: [name] [name] [name] Other Clinician: Referring [name] [name] [name]: Treating [name] [name] [name]/Extender: [name] [name] [name] in Treatment: 70 Electronic Signature(s) Signed: 04/28/2023 8:01:26 AM By: [name] RN Entered By: [name] on 04/01/2023 06:12:04 -------------------------------------------------------------------------------- Multi Wound Chart Details Patient Name: Date of Service: [name] [name] [name] [facility] 04/01/2023 8:45 A M Medical Record Number: [id number] Patient Account Number: [id number] Date of Birth/Sex: Treating RN: May 23, 1956 (66 y.o. [name] [name] [name]) [name] Primary Care [name] [name] [name]: [name] [name] [name] Other Clinician: Referring [name] [name] [name]: Treating [name] [name] [name]/Extender: [name] [name] [name] in Treatment: 70 Vital Signs Height(in): 66 Pulse(bpm): 90 Weight(lbs): 279 Blood  Pressure(mmHg): 164/78 Body Mass Index(BMI): 45 Temperature(F): 97.8 Respiratory Rate(breaths/min): 18 [5:Photos:] Right Calcaneus Sacrum Left, Lateral Lower Leg Wound Location: Pressure Injury Pressure Injury Gradually Appeared Wounding 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  [name] [name], [name] ([id number]) [id number] Page 1 of 10 Visit Report for 04/01/2023 Arrival Information Details Patient Name: Date of Service: [name] [name] [name] [facility] 04/01/2023 8:45 A M Medical Record Number: [id number] Patient Account Number: [id number] Date of Birth/Sex: Treating RN: 1956-01-29 (66 y.o. [name] [name] [name]) [name] Primary Care Ravin Denardo: [name] [name] [name] Other Clinician: Referring Shakeda Pearse: Treating Gearlene Godsil/Extender: [name] [name] [name] in Treatment: 25 Visit Information History Since Last Visit Added or deleted any medications: No Patient Arrived: Wheel Chair Any new allergies or adverse reactions: No Arrival Time: 08:59 Had a fall or experienced change in No Accompanied By: wife activities of daily living that may affect Transfer Assistance: None risk of falls: Patient Identification Verified: Yes Signs or symptoms of abuse/neglect since last visito No Secondary Verification Process Completed: Yes Hospitalized since last visit: No Patient Requires Transmission-Based Precautions: No Implantable device outside of the clinic excluding No Patient Has Alerts: Yes cellular tissue based products placed in the center Patient Alerts: Patient on Blood Thinner since last visit: Has Dressing in Place as Prescribed: Yes Pain Present Now: No Electronic Signature(s) Signed: 04/28/2023 8:01:26 AM By: [name] RN Entered By: [name] on 04/01/2023 06:02:01 -------------------------------------------------------------------------------- Clinic Level of Care Assessment Details Patient Name: Date of Service: [name] [name] [name] [facility] 04/01/2023 8:45 A M Medical Record Number: [id number] Patient Account Number: [id number] Date of Birth/Sex: Treating RN: August 16, 1955 (66 y.o. [name] [name] [name]) [name] Primary Care Marithza Malachi: [name] [name] [name] Other Clinician: Referring Jael Kostick: Treating Abelardo Seidner/Extender: [name] [name] [name] in  Treatment: 70 Clinic Level of Care Assessment Items TOOL 4 Quantity Score X- 1 0 Use when only an EandM is performed on FOLLOW-UP visit ASSESSMENTS - Nursing Assessment / Reassessment X- 1 10 Reassessment of Co-morbidities (includes updates in patient status) X- 1 5 Reassessment of Adherence to Treatment Plan ENAN, OUTMAN ([id number]) [id number] Page 2 of 10 ASSESSMENTS - Wound and Skin A ssessment / Reassessment []  - Simple Wound Assessment / Reassessment - one wound 0 X- 3 5 Complex Wound Assessment / Reassessment - multiple wounds []  - 0 Dermatologic / Skin Assessment (not related to wound area) ASSESSMENTS - Focused Assessment []  - 0 Circumferential Edema Measurements - multi extremities []  - 0 Nutritional Assessment / Counseling / Intervention []  - 0 Lower Extremity Assessment (monofilament, tuning fork, pulses) []  - 0 Peripheral Arterial Disease Assessment (using hand held doppler) ASSESSMENTS - Ostomy and/or Continence Assessment and Care []  - 0 Incontinence Assessment and Management []  - 0 Ostomy Care Assessment and Management (repouching, etc.) PROCESS - Coordination of Care X - Simple Patient / Family Education for ongoing care 1 15 []  - 0 Complex (extensive) Patient / Family Education for ongoing care []  - 0 Staff obtains Chiropractor, Records, T Results / Process Orders est []  - 0 Staff telephones HHA, Nursing Homes / Clarify orders / etc []  - 0 Routine Transfer to another Facility (non-emergent condition) []  - 0 Routine Hospital Admission (non-emergent condition) []  - 0 New Admissions / Manufacturing engineer / Ordering NPWT Apligraf, etc. , []  - 0 Emergency Hospital Admission (emergent condition) X- 1 10 Simple Discharge Coordination []  - 0 Complex (extensive) Discharge Coordination PROCESS - Special Needs []  - 0 Pediatric / Minor Patient Management []  - 0 Isolation Patient Management []  - 0 Hearing / Language  / Visual special needs []  - 0 Assessment of Community assistance (transportation, D/C planning, etc.) []  - 0 Additional assistance / Altered mentation []  - 0 Support Surface(s) Assessment (bed, cushion, seat, etc.) INTERVENTIONS - Wound Cleansing / Measurement []  -  Apnea, Coronary Artery Disease, Deep Vein Weeks Of Treatment: 0 History: Thrombosis, Hypertension, Type II Diabetes, History of pressure Clustered Wound: No wounds, Neuropathy Photos Wound Measurements Length: (cm) 2.5 Width: (cm) 0.7 Depth: (cm) 0.3 Area: (cm) 1.374 Volume: (cm) 0.412 % Reduction in Area: % Reduction in Volume: Epithelialization: None Tunneling: No Undermining: No Wound Description Classification: Grade 1 Exudate Amount: Medium [name], [name] [name] ([id number]) Exudate Type: Serosanguineous Exudate Color: red, brown Foul Odor After Cleansing: No Slough/Fibrino No [id number] Page 10 of 10 Wound Bed Granulation Amount: Large (67-100%) Exposed Structure Granulation Quality: Red Fascia Exposed: No Necrotic Amount: None Present (0%) Fat Layer (Subcutaneous Tissue) Exposed: Yes Tendon Exposed: No Muscle Exposed: No Joint Exposed: No Bone Exposed: No Electronic Signature(s) Signed: 04/28/2023 8:01:26 AM By: [name] RN Entered By: [name] on 04/01/2023 06:11:51 -------------------------------------------------------------------------------- Vitals Details Patient Name: Date of Service: [name] [name] [name] [facility] 04/01/2023 8:45 A M Medical Record Number: [id number] Patient Account Number: [id number] Date of Birth/Sex: Treating RN: 08/05/1955  (66 y.o. [name] [name] [name]) [name] Primary Care Ledarius Leeson: [name] [name] [name] Other Clinician: Referring [name] [name] [name]: Treating [name] [name] [name]/Extender: [name] [name] [name] in Treatment: 70 Vital Signs Time Taken: 09:02 Temperature (F): 97.8 Height (in): 66 Pulse (bpm): 90 Weight (lbs): 279 Respiratory Rate (breaths/min): 18 Body Mass Index (BMI): 45 Blood Pressure (mmHg): 164/78 Reference Range: 80 - 120 mg / dl Electronic Signature(s) Signed: 04/28/2023 8:01:26 AM By: [name] RN Entered By: [name] on 04/01/2023 06:02:21  Apnea, Coronary Artery Disease, Deep Vein Weeks Of Treatment: 0 History: Thrombosis, Hypertension, Type II Diabetes, History of pressure Clustered Wound: No wounds, Neuropathy Photos Wound Measurements Length: (cm) 2.5 Width: (cm) 0.7 Depth: (cm) 0.3 Area: (cm) 1.374 Volume: (cm) 0.412 % Reduction in Area: % Reduction in Volume: Epithelialization: None Tunneling: No Undermining: No Wound Description Classification: Grade 1 Exudate Amount: Medium [name], [name] [name] ([id number]) Exudate Type: Serosanguineous Exudate Color: red, brown Foul Odor After Cleansing: No Slough/Fibrino No [id number] Page 10 of 10 Wound Bed Granulation Amount: Large (67-100%) Exposed Structure Granulation Quality: Red Fascia Exposed: No Necrotic Amount: None Present (0%) Fat Layer (Subcutaneous Tissue) Exposed: Yes Tendon Exposed: No Muscle Exposed: No Joint Exposed: No Bone Exposed: No Electronic Signature(s) Signed: 04/28/2023 8:01:26 AM By: [name] RN Entered By: [name] on 04/01/2023 06:11:51 -------------------------------------------------------------------------------- Vitals Details Patient Name: Date of Service: [name] [name] [name] [facility] 04/01/2023 8:45 A M Medical Record Number: [id number] Patient Account Number: [id number] Date of Birth/Sex: Treating RN: 08/05/1955  (66 y.o. [name] [name] [name]) [name] Primary Care Ledarius Leeson: [name] [name] [name] Other Clinician: Referring [name] [name] [name]: Treating [name] [name] [name]/Extender: [name] [name] [name] in Treatment: 70 Vital Signs Time Taken: 09:02 Temperature (F): 97.8 Height (in): 66 Pulse (bpm): 90 Weight (lbs): 279 Respiratory Rate (breaths/min): 18 Body Mass Index (BMI): 45 Blood Pressure (mmHg): 164/78 Reference Range: 80 - 120 mg / dl Electronic Signature(s) Signed: 04/28/2023 8:01:26 AM By: [name] RN Entered By: [name] on 04/01/2023 06:02:21  0 Simple Wound Cleansing - one wound X- 3 5 Complex Wound Cleansing - multiple wounds X- 1 5 Wound Imaging (photographs - any number of wounds) []  - 0 Wound Tracing (instead of photographs) []  - 0 Simple Wound Measurement - one wound X- 3 5 Complex Wound Measurement - multiple wounds INTERVENTIONS - Wound Dressings X - Small Wound Dressing one or multiple wounds 3 10 []  - 0 Medium Wound Dressing one or multiple wounds []  - 0 Large Wound Dressing one or multiple wounds []  - 0 Application of Medications - topical []  - 0 Application of Medications - injection INTERVENTIONS - Miscellaneous []  - 0 External ear exam [name], [name] [name] Longs ([id number]) [id number] Page 3 of 10 []  - 0 Specimen Collection (cultures, biopsies, blood, body fluids, etc.) []  - 0 Specimen(s) / Culture(s) sent or taken to Lab for analysis []  - 0 Patient Transfer (multiple staff / Michiel Sites Lift / Similar devices) []  - 0 Simple Staple / Suture removal (25 or less) []  - 0 Complex Staple / Suture removal (26 or more) []  - 0 Hypo / Hyperglycemic Management (close monitor of Blood Glucose) []  - 0 Ankle / Brachial Index (ABI) - do not check if billed separately X- 1 5 Vital Signs Has the patient been seen at the hospital within the last three years: Yes Total Score: 125 Level Of Care: New/Established - Level 4 Electronic Signature(s) Signed: 04/28/2023 8:01:26 AM By: [name] RN Entered By: [name] on 04/01/2023 06:50:59 -------------------------------------------------------------------------------- Encounter Discharge Information Details Patient Name: Date of Service: [name] [name] [name] [facility] 04/01/2023  8:45 A M Medical Record Number: [id number] Patient Account Number: [id number] Date of Birth/Sex: Treating RN: [birth year] (66 y.o. [name] [name] [name] Primary Care [name] [name] [name]: [name] [name] [name] Other Clinician: Referring [name] [name] [name]: Treating [name] [name] [name]/Extender: [name] [name] [name] in Treatment: 91 Encounter Discharge Information Items Discharge Condition: Stable Ambulatory Status: Wheelchair Discharge Destination: Home Transportation: Private Auto Accompanied By: wife Schedule Follow-up Appointment: Yes Clinical Summary of Care: Electronic Signature(s) Signed: 04/01/2023 9:53:16 AM By: [name] RN Entered By: [name] on 04/01/2023 06:53:16 -------------------------------------------------------------------------------- Lower Extremity Assessment Details Patient Name: Date of Service: [name] [name] [name] [facility] 04/01/2023 8:45 [address] [name] [name] Longs ([id number]) [id number] Page 4 of 10 Medical Record Number: [id number] Patient Account Number: [id number] Date of Birth/Sex: Treating RN: 1956-04-06 (66 y.o. [name] [name] [name]) [name] Primary Care [name]: [name] [name] [name] Other Clinician: Referring [name] [name] [name]: Treating [name] [name] [name]/Extender: [name] [name] [name] in Treatment: 70 Electronic Signature(s) Signed: 04/28/2023 8:01:26 AM By: [name] RN Entered By: [name] on 04/01/2023 06:12:04 -------------------------------------------------------------------------------- Multi Wound Chart Details Patient Name: Date of Service: [name] [name] [name] [facility] 04/01/2023 8:45 A M Medical Record Number: [id number] Patient Account Number: [id number] Date of Birth/Sex: Treating RN: May 23, 1956 (66 y.o. [name] [name] [name]) [name] Primary Care [name] [name] [name]: [name] [name] [name] Other Clinician: Referring [name] [name] [name]: Treating [name] [name] [name]/Extender: [name] [name] [name] in Treatment: 70 Vital Signs Height(in): 66 Pulse(bpm): 90 Weight(lbs): 279 Blood  Pressure(mmHg): 164/78 Body Mass Index(BMI): 45 Temperature(F): 97.8 Respiratory Rate(breaths/min): 18 [5:Photos:] Right Calcaneus Sacrum Left, Lateral Lower Leg Wound Location: Pressure Injury Pressure Injury Gradually Appeared Wounding 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

## 2023-05-13 ENCOUNTER — Encounter: Payer: Medicare Other | Attending: Physician Assistant | Admitting: Physician Assistant

## 2023-05-13 ENCOUNTER — Ambulatory Visit (INDEPENDENT_AMBULATORY_CARE_PROVIDER_SITE_OTHER): Payer: Medicare Other | Admitting: Urology

## 2023-05-13 ENCOUNTER — Encounter: Payer: Self-pay | Admitting: Urology

## 2023-05-13 VITALS — BP 118/70 | HR 85 | Ht 66.0 in | Wt 300.0 lb

## 2023-05-13 DIAGNOSIS — L89613 Pressure ulcer of right heel, stage 3: Secondary | ICD-10-CM | POA: Insufficient documentation

## 2023-05-13 DIAGNOSIS — E11622 Type 2 diabetes mellitus with other skin ulcer: Secondary | ICD-10-CM | POA: Insufficient documentation

## 2023-05-13 DIAGNOSIS — E1151 Type 2 diabetes mellitus with diabetic peripheral angiopathy without gangrene: Secondary | ICD-10-CM | POA: Insufficient documentation

## 2023-05-13 DIAGNOSIS — Z86718 Personal history of other venous thrombosis and embolism: Secondary | ICD-10-CM | POA: Insufficient documentation

## 2023-05-13 DIAGNOSIS — L97822 Non-pressure chronic ulcer of other part of left lower leg with fat layer exposed: Secondary | ICD-10-CM | POA: Diagnosis not present

## 2023-05-13 DIAGNOSIS — G4730 Sleep apnea, unspecified: Secondary | ICD-10-CM | POA: Diagnosis not present

## 2023-05-13 DIAGNOSIS — F319 Bipolar disorder, unspecified: Secondary | ICD-10-CM | POA: Diagnosis not present

## 2023-05-13 DIAGNOSIS — R3989 Other symptoms and signs involving the genitourinary system: Secondary | ICD-10-CM | POA: Diagnosis not present

## 2023-05-13 DIAGNOSIS — L89154 Pressure ulcer of sacral region, stage 4: Secondary | ICD-10-CM | POA: Insufficient documentation

## 2023-05-13 DIAGNOSIS — I10 Essential (primary) hypertension: Secondary | ICD-10-CM | POA: Diagnosis not present

## 2023-05-13 DIAGNOSIS — Z7901 Long term (current) use of anticoagulants: Secondary | ICD-10-CM | POA: Diagnosis not present

## 2023-05-13 DIAGNOSIS — R339 Retention of urine, unspecified: Secondary | ICD-10-CM | POA: Diagnosis not present

## 2023-05-13 DIAGNOSIS — N319 Neuromuscular dysfunction of bladder, unspecified: Secondary | ICD-10-CM

## 2023-05-13 LAB — URINALYSIS, COMPLETE
Bilirubin, UA: NEGATIVE
Glucose, UA: NEGATIVE
Ketones, UA: NEGATIVE
Nitrite, UA: NEGATIVE
Specific Gravity, UA: >1.03 — ABNORMAL HIGH (ref 1.005–1.030)
Urobilinogen, Ur: 0.2 mg/dL (ref 0.2–1.0)
pH, UA: 5 (ref 5.0–7.5)

## 2023-05-13 LAB — MICROSCOPIC EXAMINATION: WBC, UA: >30 /[HPF] — AB (ref 0–5)

## 2023-05-13 MED ORDER — SULFAMETHOXAZOLE-TRIMETHOPRIM 800-160 MG PO TABS
1.0000 | ORAL_TABLET | Freq: Two times a day (BID) | ORAL | 0 refills | Status: DC
Start: 2023-05-13 — End: 2023-07-18

## 2023-05-13 NOTE — Progress Notes (Signed)
XUE, OVERTON ([id number]) [id number] Page 1 of 9 Visit Report for 05/13/2023 Chief Complaint Document Details Patient Name: Date of Service: [name] [facility] 05/13/2023 8:45 A M Medical Record Number: [id number] Patient Account Number: [id number] Date of Birth/Sex: Treating RN: [birth year] (67 y.o. [name]) [name] Primary Care Provider: Dione Booze Other Clinician: Referring Provider: Treating Provider/Extender: [name] in Treatment: 76 Information Obtained from: Patient Chief Complaint Sacral, right heel, and left leg ulcers Electronic Signature(s) Signed: 05/13/2023 10:03:11 AM By: Allen Derry PA-C Entered By: Allen Derry on 05/13/2023 07:03:11 -------------------------------------------------------------------------------- HPI Details Patient Name: Date of Service: [name] [facility] 05/13/2023 8:45 A M Medical Record Number: [id number] Patient Account Number: [id number] Date of Birth/Sex: Treating RN: 09/23/1955 (67 y.o. [name] Primary Care Provider: Dione Booze Other Clinician: Referring Provider: Treating Provider/Extender: [name] in Treatment: 36 History of Present Illness HPI Description: 05/29/2021 this is a patient who presents today for initial inspection here in the clinic concerning wounds that he has over the right heel and the sacral region. Unfortunately the sacral wound is starting to spread off to the right gluteal location due to how he sits always leaning towards the right side in his chair. His wife is present she is the primary caregiver though she is not home with him all the time she does have to work. She does do an excellent job however it appears to be in trying to keep things under good control for him. The patient is not able to change positions himself nor walk by himself so he is pretty much at the mercy of the position he is  putting when she is gone and this tends to be his chair which she sits and most of the day. Obviously this I think is the main culprit for what is going on currently. It was actually in January 2020 when the sacral wound started. It was in September 2022 when the wound started to spread more to the right gluteal location. Subsequently in August 2022 is when he had been in a skilled nursing facility and the heel started to give him trouble as well. That does not seem to be doing nearly as poorly as the sacral region. He was hospitalized in October 2022 secondary to sepsis and this was in regard to the foot and was sent to skilled nursing again he is now back at home. He did have a wound VAC for the sacral wound over the summer 2022 but being in and out of facilities this ended up getting sent back. The patient does have [facility] home health that comes out 1 time per week to help with care. His most recent hemoglobin A1c was 6.9 in August 2022. 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 s. MOSIAH, REXROTH ([id number]) [id number] Page 2 of 9 07/24/2021 upon evaluation today patient appears to be doing decently well in regard to the pressure ulcer on the right heel as well as the sacral region. In general I think you are making some progress here which is great news. Overall the heel unfortunately had already closed previously when we saw him although it apparently reopened when he was working with physical therapy according to his wife. The area in the sacral region is doing well and looks clean there is still some depth here  but I still think it would be difficult to wound VAC this region. His wife does an awesome job taking care of him. Is been so long since we  have seen him because he has been in the hospital to be honest. 08/07/2021 upon evaluation today patient appears to be doing well at this time. Fortunately I do not see any signs of active infection locally or systemically at this time which is great news. No fevers, chills, nausea, vomiting, or diarrhea. Unfortunately after I saw him on the 24th he actually ended up in the hospital in the 27th due to being septic. This was not due to the wounds but after looking at records actually due to a UTI. Fortunately he is doing much better and very happy in that regard. I do not see any signs of infection locally nor systemically at this time. 08/21/2021 upon evaluation today patient appears to be doing well with regard to his wound. Fortunately I think that the sacral region is doing decently well at this point which is great news. With regard to the foot this also does have some slough noted but I feel like we are making progress here. He does have some irritation around the right upper thigh/gluteal region. I feel like it is more towards the thigh. Nonetheless this does appear to be pressure related he spends a lot of time sitting up his caregiver states he really will not get in the bed and stay there like he should. 09/04/2021 upon evaluation patient appears to be doing decently well today in regard to the wound on his heel as well as the sacral area. I am actually very pleased with both and how things are appearing currently. There does not appear to be any evidence of active infection I think that his caregiver is doing an awesome job with regard to the wound care here. She is present today as well and I did discuss this with her. 09/25/2021 upon evaluation today patient appears to be doing well with regard to his wound on the sacral region. His right heel is also doing well. Unfortunately he has a new area on his left heel which does appear to be pressure injury. I do not see any evidence of active infection  locally or systemically which is great news no fevers, chills, nausea, vomiting, or diarrhea. Readmission: 11-22-2021 upon evaluation today patient presents for reevaluation here in the clinic concerning issues with his wounds. Since have last seen him he was in the hospital and then subsequently ended up in a skilled nursing facility. Subsequently period of time in the facility he actually had a breakdown in the right thigh location which has been an issue for him. Fortunately I do not see any evidence of active infection locally or systemically which is great news and I am very pleased in that regard. Nonetheless he does have still the wounds on both heels as well as the wound in the sacral area and the new area in the right upper thigh/gluteal region. 12-13-2021 upon evaluation today patient appears to be doing well currently in regard to his wounds. The left heel is going to require some sharp debridement. Fortunately the right heel seems to be doing quite well. Overall I think his gluteal region as well as sacral region also are doing well. 01-10-2022 upon evaluation today patient appears to be doing better in regard to some areas unfortunately his sacral area is not doing as well. It seems of gotten worse his caregiver states  when he was in the hospital recently they really did not offloading. Fortunately I do not see any evidence of active infection locally or systemically at this time. 02-01-2022 upon evaluation today patient appears to be doing well currently in regard to his wounds. He is going require some sharp debridement of clearway some of the necrotic debris. Fortunately I do not see any evidence of active infection locally or systemically which is great news. No fevers, chills, nausea, vomiting, or diarrhea. 02-22-2022 upon evaluation today patient appears to be doing poorly in regard to his right heel the left heel is completely closed. Unfortunately I think he is got some deep tissue  injury to this area I am going to do a culture at this spot. With that being said he is having 2 other significant issues in regard to the left thigh this is showing signs of cellulitis all the way down into the thigh and was seeing definite temperature difference in the left thigh versus the right thigh when feeling. It also is of note that the patient is also having some issues here with trouble in regard to having fevers his wife has been giving him Tylenol and ibuprofen to help keep the fevers under control but he has been as high as 102 this is pretty much been going on since Monday or Tuesday of this week. Nonetheless I am concerned I am not exactly sure where the cellulitis on the left thigh is coming from not sure if this is emanating from his sacral region that is a possibility or if there is something completely different going on either way with the fevers I am more worried that he needs to go get this checked out as soon as possible. 03-08-2022 upon evaluation today patient appears to be doing well with regard to his heel as well as sacral region. Both are showing signs of significant improvement compared to the last time I saw him. Overall I think we are in a much better spot 04-04-2022 upon evaluation today patient appears to be doing about the same in regard to his wounds. I do not see any signs of improvement nor worsening at this point. Fortunately there is no evidence of active infection at this time. He is not staying off of this is much as he needs to in fact is not even sleeping in the hospital bed with the alternating air mattress. 04-16-2022 upon evaluation today patient appears to be doing well currently in regard to his wound. He has been tolerating the dressing changes without complication. Fortunately I do not see any evidence of infection. The heel actually looks quite well the sacral area actually is still quite deep. From my questioning and discussion with him today I do not  believe that he is really staying off of this as much as he should. He seemed a little uncomfortable during that portion of the conversation today. I think that is contributing quite a bit to his lack of progress with regard to healing. 11/7; patient with decubitus ulcers on his right heel as well as his lower sacrum. We have been using Hydrofera Blue. He has home health predominantly for Foley catheter management. There have been some issues getting the Hydrofera Blue through home health but apparently the patient's wife has some supplies and is managing to get these changed.They come every 3 weeks because of transportation difficulties 05-28-2022 upon evaluation today patient appears to be doing decently well currently in regard to his wounds. He still has a  depth to the wound in the sacral region but again this does seem to be a little bit less than previous. He has been using Hydrofera Blue both on this in the heel ulcer. Both are going to require some cleaning today sharp debridement on the heel I think is cleaning with saline gauze and the sterile Q-tip is probably sufficient for the sacral area which does not appear to have too much necrotic tissue. 06-18-2022 upon evaluation today patient's wounds are showing signs of doing decently well. Fortunately I do not see any signs of active infection locally nor systemically at this time which is great news. No fevers, chills, nausea, vomiting, or diarrhea. 07-15-2022 upon evaluation today patient appears to be doing well currently in regard to his wound. He has been tolerating the dressing changes without complication. Fortunately I think that the heel is doing well except for where it is causing pressure to the heel posteriorly and this apparently is due to some kind of exercise machine that his children got him. Nonetheless I am not sure that that is going to be helpful especially if it is causing discomfort and pain. And especially if it is making  the wound worse which is an even bigger deal. With regard to the patient's sacral region this is draining quite significantly. I do think a wound VAC would be ideal here and I would recommend that we go ahead and try to see about getting this ordered for him as soon as possible. The patient's wife and the patient are in agreement with this plan. 08-05-2022 upon evaluation today patient appears to be doing well currently in regard to both his heel and his sacral region. Unfortunately he is not eligible for a wound VAC as he is previously already undergone all the treatment that is allowable under Medicare guidelines for his sacral wound. This is quite unfortunate as I felt like that could have potentially help this speed this up but nonetheless it is where we stand. For that reason I think the [facility] is probably still the best way to continue at this point and I discussed that with the patient and his wife today. 08-26-2022 upon evaluation today patient appears to be doing well currently in regard to his wound. He on the heel of his looking much better I did have to perform some debridement here. In regard to the sacral area no debridement was necessary it looks to clean this has been although there was a lot of Hydrofera Blue and there much more than is probably necessary. KEYONTAY, WAMBACH ([id number]) [id number] Page 3 of 9 09-15-2021 upon evaluation today patient appears to be doing well currently in regard to his heel ulcer which is looking okay although the gluteal region specifically the sacral wound is showing signs of some issues there. Fortunately I do not see any evidence of infection significantly to the heel although I am not so sure about the sacral region. I think he probably would benefit from initiation of antibiotics. 10-01-2022 upon evaluation today patient unfortunately had a hospital stay where he ended up having his catheter which had been  apparently blowing up into the urethra. With that being said he had traumatic bleeding and subsequently ended up in the hospital I did review that discharge summary as well. The good news is he is doing somewhat better at this point but he did end up laying on his backside a lot causing the wound on the sacral area to actually be worse today compared  to where we have been previous. This is not good and not the direction we want to go. Again the depth is what was actually more significant. 10-22-2022 upon evaluation today patient appears to be doing decently well in regard to his heel ulcer. I do not see any signs of active infection locally nor systemically at this time which is great news. No fevers, chills, nausea, vomiting, or diarrhea. 11-12-2022 upon evaluation today patient appears to be doing well currently with regard to his heel ulcer which is actually showing signs of excellent progress. Fortunately I do not see any signs of infection and I think that he is making good progress here. Fortunately I do not see any signs of infection with regard to the sacral area either. 12-03-2022 upon evaluation today patient appears to be doing well currently in regard to his wounds. The heel looks to be almost healed but it is still open. With that being said regular continue with the [facility] to both locations. Unfortunately regard to the gluteal region this is not doing quite as well. I discussed with the patient and his wife that we really need to try to see what can be done in order to make sure this is pectin well and stays in. We also had an incident where the patient's wife has a picture of where a plastic bag containing Hydrofera Blue inside of it was actually taped to the wound on his gluteal region followed by an ABD pad secured with tape honestly if I did not see the pictures I would not do believe that she tells me that she is actually going to head over to [facility] home office when she leaves  here to have a discussion with somebody about this as that is unacceptable I agree. 12-24-2022 upon evaluation today patient appears to be doing well currently in regard to his heel which is looking better the sacral area is not looking quite as good. It is not infected and is not worse but is also not a lot improved compared to last time I saw him. Again he still is not staying off of this asking today how he was doing with staying off of his gluteal region and offloading and he tells me not very good. 01-14-2023 upon evaluation today patient appears to be doing well currently in regard to his sacral wound which I feel like is making some headway towards closure little by little. Unfortunately his heel is actually significantly worse compared to last I saw him. Fortunately I do not see any signs of infection at this time. 02-04-23 evaluation today patient appears to be doing well with regards to the sacral wound was not doing quite as good. Fortunately I do not see any signs of infection at either site right now although we will keep a close eye on this heal as we move forward. 8/27; this is a patient we follow for a sacral wound on the right heel wound which were pressure ulcers and this man who is fairly disabled. According to his wife he does not offload this properly he is up sitting in his wheelchair quite a bit during the day. On the sacrum we are using Hydrofera Blue ABDs and on the heel Hydrofera Blue blue and gauze. In general the wounds I think are stable not changing too much recently. 04-01-2023 upon evaluation today patient appears to be doing well currently in regard to the sacral wound which is not worse but is also not better. It has  been about a month since have seen him as he was septic due to UTI and ended up in rehab following due to being weak. With that being said he does have a new wound on the left lower extremity which is secondary to having been placed in the wheelchair left they  are too long during the time he was in rehab according to his wife. 04-22-2023 upon evaluation today patient appears to be doing well currently in regard to his wounds which all actually appear to be doing a little bit better if not completely better with the ankle already being healed. With that being said he was in the hospital from October 11 through October 19 due to what is stated to be sepsis secondary to catheter associated UTI specifically Enterococcus is what was mentioned. With that being said he seems to be doing much better at this point. They do have a Hoyer at home which has been of great benefit and he is staying off of this more at home which I think has helped all of his wounds as well. Fortunately I do not see any signs of active infection locally or systemically at the moment which is great news and I do believe that he is doing well in that regard. 05-13-2023 upon evaluation today patient's wounds unfortunately do not appear to be doing quite as well as they were last week. I do not see any signs of infection and in fact has not been just a week but about 3 weeks since I last saw him. Fortunately I do not see any evidence of worsening as far as infection is concerned but I did see signs of deep tissue injury to the heel and some expansion of the outer rim of the wound on the sacral location. Electronic Signature(s) Signed: 05/13/2023 10:03:40 AM By: Allen Derry PA-C Entered By: Allen Derry on 05/13/2023 07:03:40 -------------------------------------------------------------------------------- Physical Exam Details Patient Name: Date of Service: [name] [facility] 05/13/2023 8:45 A M Medical Record Number: [id number] Patient Account Number: [id number] Date of Birth/Sex: Treating RN: 10-01-1955 (67 y.o. [name]) [name] Primary Care Provider: Dione Booze Other Clinician: Referring Provider: Treating Provider/Extender: [name] in Treatment:  1 Constitutional Well-nourished and well-hydrated in no acute distress. OSMANI, LACLAIR ([id number]) [id number] Page 4 of 9 Respiratory normal breathing without difficulty. Psychiatric this patient is able to make decisions and demonstrates good insight into disease process. Alert and Oriented x 3. pleasant and cooperative. Notes Patient's wounds do not appear to be doing quite as well as they were 3 weeks ago when I last saw him. With that being said I do believe that a lot of this has to do with the fact that he is having issues here with ongoing pressure to the areas. I again discussed with him appropriate offloading he needs to try as much as possible to stay off of the wounds. Electronic Signature(s) Signed: 05/13/2023 10:04:16 AM By: Allen Derry PA-C Entered By: Allen Derry on 05/13/2023 07:04:15 -------------------------------------------------------------------------------- Problem List Details Patient Name: Date of Service: [name] [facility] 05/13/2023 8:45 A M Medical Record Number: [id number] Patient Account Number: [id number] Date of Birth/Sex: Treating RN: June 25, 1956 (67 y.o. [name]) [name] Primary Care Provider: Dione Booze Other Clinician: Referring Provider: Treating Provider/Extender: [name] in Treatment: 39 Active Problems ICD-10 Encounter Code Description Active Date MDM Diagnosis L89.154 Pressure ulcer of sacral region, stage 4 11/22/2021 No Yes L89.613 Pressure ulcer of right heel, stage 3 11/22/2021 No  Yes [id number] Non-pressure chronic ulcer of other part of left lower leg with fat layer exposed10/07/2022 No Yes M62.81 Muscle weakness (generalized) 11/22/2021 No Yes F31.9 Bipolar disorder, unspecified 11/22/2021 No Yes E11.622 Type 2 diabetes mellitus with other skin ulcer 11/22/2021 No Yes I10 Essential (primary) hypertension 11/22/2021 No Yes Z79.01 Long term (current) use of  anticoagulants 11/22/2021 No Yes Karen, [name] [id number] Page 5 of 9 [phone] Personal history of traumatic brain injury 11/22/2021 No Yes Inactive Problems ICD-10 Code Description Active Date Inactive Date L89.623 Pressure ulcer of left heel, stage 3 11/22/2021 11/22/2021 Resolved Problems ICD-10 Code Description Active Date Resolved Date L24.A0 Irritant contact dermatitis due to friction or contact with body fluids, unspecified 11/22/2021 11/22/2021 G64.403 Non-pressure chronic ulcer of buttock with fat layer exposed 11/22/2021 11/22/2021 Electronic Signature(s) Signed: 05/13/2023 10:03:09 AM By: Allen Derry PA-C Entered By: Allen Derry on 05/13/2023 07:03:09 -------------------------------------------------------------------------------- Progress Note Details Patient Name: Date of Service: [name] [facility] 05/13/2023 8:45 A M Medical Record Number: [id number] Patient Account Number: [id number] Date of Birth/Sex: Treating RN: [birth year] (67 y.o. [name]) [name] Primary Care Provider: Dione Booze Other Clinician: Referring Provider: Treating Provider/Extender: [name] in Treatment: 76 Subjective Chief Complaint Information obtained from Patient Sacral, right heel, and left leg ulcers History of Present Illness (HPI) 05/29/2021 this is a patient who presents today for initial inspection here in the clinic concerning wounds that he has over the right heel and the sacral region. Unfortunately the sacral wound is starting to spread off to the right gluteal location due to how he sits always leaning towards the right side in his chair. His wife is present she is the primary caregiver though she is not home with him all the time she does have to work. She does do an excellent job however it appears to be in trying to keep things under good control for him. The patient is not able to change positions himself  nor walk by himself so he is pretty much at the mercy of the position he is putting when she is gone and this tends to be his chair which she sits and most of the day. Obviously this I think is the main culprit for what is going on currently. It was actually in January 2020 when the sacral wound started. It was in September 2022 when the wound started to spread more to the right gluteal location. Subsequently in August 2022 is when he had been in a skilled nursing facility and the heel started to give him trouble as well. That does not seem to be doing nearly as poorly as the sacral region. He was hospitalized in October 2022 secondary to sepsis and this was in regard to the foot and was sent to skilled nursing again he is now back at home. He did have a wound VAC for the sacral wound over the summer 2022 but being in and out of facilities this ended up getting sent back. The patient does have [facility] home health that comes out 1 time per week to help with care. His most recent hemoglobin A1c was 6.9 in August 2022. 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 s. 07/24/2021 upon evaluation today patient appears to be doing decently well in regard to the pressure ulcer on the right heel as well as the  sacral region. In general I think you are making some progress here which is great news. Overall the heel unfortunately had already closed previously when we saw him MITHCELL, OZBIRN ([id number]) [id number] Page 6 of 9 although it apparently reopened when he was working with physical therapy according to his wife. The area in the sacral region is doing well and looks clean there is still some depth here but I still think it would be difficult to wound VAC this region.  His wife does an awesome job taking care of him. Is been so long since we have seen him because he has been in the hospital to be honest. 08/07/2021 upon evaluation today patient appears to be doing well at this time. Fortunately I do not see any signs of active infection locally or systemically at this time which is great news. No fevers, chills, nausea, vomiting, or diarrhea. Unfortunately after I saw him on the 24th he actually ended up in the hospital in the 27th due to being septic. This was not due to the wounds but after looking at records actually due to a UTI. Fortunately he is doing much better and very happy in that regard. I do not see any signs of infection locally nor systemically at this time. 08/21/2021 upon evaluation today patient appears to be doing well with regard to his wound. Fortunately I think that the sacral region is doing decently well at this point which is great news. With regard to the foot this also does have some slough noted but I feel like we are making progress here. He does have some irritation around the right upper thigh/gluteal region. I feel like it is more towards the thigh. Nonetheless this does appear to be pressure related he spends a lot of time sitting up his caregiver states he really will not get in the bed and stay there like he should. 09/04/2021 upon evaluation patient appears to be doing decently well today in regard to the wound on his heel as well as the sacral area. I am actually very pleased with both and how things are appearing currently. There does not appear to be any evidence of active infection I think that his caregiver is doing an awesome job with regard to the wound care here. She is present today as well and I did discuss this with her. 09/25/2021 upon evaluation today patient appears to be doing well with regard to his wound on the sacral region. His right heel is also doing well. Unfortunately he has a new area on his left heel which does  appear to be pressure injury. I do not see any evidence of active infection locally or systemically which is great news no fevers, chills, nausea, vomiting, or diarrhea. Readmission: 11-22-2021 upon evaluation today patient presents for reevaluation here in the clinic concerning issues with his wounds. Since have last seen him he was in the hospital and then subsequently ended up in a skilled nursing facility. Subsequently period of time in the facility he actually had a breakdown in the right thigh location which has been an issue for him. Fortunately I do not see any evidence of active infection locally or systemically which is great news and I am very pleased in that regard. Nonetheless he does have still the wounds on both heels as well as the wound in the sacral area and the new area in the right upper thigh/gluteal region. 12-13-2021 upon evaluation today patient appears to be doing well currently in regard to  his wounds. The left heel is going to require some sharp debridement. Fortunately the right heel seems to be doing quite well. Overall I think his gluteal region as well as sacral region also are doing well. 01-10-2022 upon evaluation today patient appears to be doing better in regard to some areas unfortunately his sacral area is not doing as well. It seems of gotten worse his caregiver states when he was in the hospital recently they really did not offloading. Fortunately I do not see any evidence of active infection locally or systemically at this time. 02-01-2022 upon evaluation today patient appears to be doing well currently in regard to his wounds. He is going require some sharp debridement of clearway some of the necrotic debris. Fortunately I do not see any evidence of active infection locally or systemically which is great news. No fevers, chills, nausea, vomiting, or diarrhea. 02-22-2022 upon evaluation today patient appears to be doing poorly in regard to his right heel the left  heel is completely closed. Unfortunately I think he is got some deep tissue injury to this area I am going to do a culture at this spot. With that being said he is having 2 other significant issues in regard to the left thigh this is showing signs of cellulitis all the way down into the thigh and was seeing definite temperature difference in the left thigh versus the right thigh when feeling. It also is of note that the patient is also having some issues here with trouble in regard to having fevers his wife has been giving him Tylenol and ibuprofen to help keep the fevers under control but he has been as high as 102 this is pretty much been going on since Monday or Tuesday of this week. Nonetheless I am concerned I am not exactly sure where the cellulitis on the left thigh is coming from not sure if this is emanating from his sacral region that is a possibility or if there is something completely different going on either way with the fevers I am more worried that he needs to go get this checked out as soon as possible. 03-08-2022 upon evaluation today patient appears to be doing well with regard to his heel as well as sacral region. Both are showing signs of significant improvement compared to the last time I saw him. Overall I think we are in a much better spot 04-04-2022 upon evaluation today patient appears to be doing about the same in regard to his wounds. I do not see any signs of improvement nor worsening at this point. Fortunately there is no evidence of active infection at this time. He is not staying off of this is much as he needs to in fact is not even sleeping in the hospital bed with the alternating air mattress. 04-16-2022 upon evaluation today patient appears to be doing well currently in regard to his wound. He has been tolerating the dressing changes without complication. Fortunately I do not see any evidence of infection. The heel actually looks quite well the sacral area actually is  still quite deep. From my questioning and discussion with him today I do not believe that he is really staying off of this as much as he should. He seemed a little uncomfortable during that portion of the conversation today. I think that is contributing quite a bit to his lack of progress with regard to healing. 11/7; patient with decubitus ulcers on his right heel as well as his lower sacrum. We have  been using Hydrofera Blue. He has home health predominantly for Foley catheter management. There have been some issues getting the Hydrofera Blue through home health but apparently the patient's wife has some supplies and is managing to get these changed.They come every 3 weeks because of transportation difficulties 05-28-2022 upon evaluation today patient appears to be doing decently well currently in regard to his wounds. He still has a depth to the wound in the sacral region but again this does seem to be a little bit less than previous. He has been using Hydrofera Blue both on this in the heel ulcer. Both are going to require some cleaning today sharp debridement on the heel I think is cleaning with saline gauze and the sterile Q-tip is probably sufficient for the sacral area which does not appear to have too much necrotic tissue. 06-18-2022 upon evaluation today patient's wounds are showing signs of doing decently well. Fortunately I do not see any signs of active infection locally nor systemically at this time which is great news. No fevers, chills, nausea, vomiting, or diarrhea. 07-15-2022 upon evaluation today patient appears to be doing well currently in regard to his wound. He has been tolerating the dressing changes without complication. Fortunately I think that the heel is doing well except for where it is causing pressure to the heel posteriorly and this apparently is due to some kind of exercise machine that his children got him. Nonetheless I am not sure that that is going to be helpful  especially if it is causing discomfort and pain. And especially if it is making the wound worse which is an even bigger deal. With regard to the patient's sacral region this is draining quite significantly. I do think a wound VAC would be ideal here and I would recommend that we go ahead and try to see about getting this ordered for him as soon as possible. The patient's wife and the patient are in agreement with this plan. 08-05-2022 upon evaluation today patient appears to be doing well currently in regard to both his heel and his sacral region. Unfortunately he is not eligible for a wound VAC as he is previously already undergone all the treatment that is allowable under Medicare guidelines for his sacral wound. This is quite unfortunate as I felt like that could have potentially help this speed this up but nonetheless it is where we stand. For that reason I think the [facility] is probably still the best way to continue at this point and I discussed that with the patient and his wife today. 08-26-2022 upon evaluation today patient appears to be doing well currently in regard to his wound. He on the heel of his looking much better I did have to perform some debridement here. In regard to the sacral area no debridement was necessary it looks to clean this has been although there was a lot of Hydrofera Blue and there much more than is probably necessary. 09-15-2021 upon evaluation today patient appears to be doing well currently in regard to his heel ulcer which is looking okay although the gluteal region specifically the sacral wound is showing signs of some issues there. Fortunately I do not see any evidence of infection significantly to the heel although I am JACEION, HUESCA ([id number]) [id number] Page 7 of 9 not so sure about the sacral region. I think he probably would benefit from initiation of antibiotics. 10-01-2022 upon evaluation today patient unfortunately  had a hospital stay where he ended up having his  catheter which had been apparently blowing up into the urethra. With that being said he had traumatic bleeding and subsequently ended up in the hospital I did review that discharge summary as well. The good news is he is doing somewhat better at this point but he did end up laying on his backside a lot causing the wound on the sacral area to actually be worse today compared to where we have been previous. This is not good and not the direction we want to go. Again the depth is what was actually more significant. 10-22-2022 upon evaluation today patient appears to be doing decently well in regard to his heel ulcer. I do not see any signs of active infection locally nor systemically at this time which is great news. No fevers, chills, nausea, vomiting, or diarrhea. 11-12-2022 upon evaluation today patient appears to be doing well currently with regard to his heel ulcer which is actually showing signs of excellent progress. Fortunately I do not see any signs of infection and I think that he is making good progress here. Fortunately I do not see any signs of infection with regard to the sacral area either. 12-03-2022 upon evaluation today patient appears to be doing well currently in regard to his wounds. The heel looks to be almost healed but it is still open. With that being said regular continue with the [facility] to both locations. Unfortunately regard to the gluteal region this is not doing quite as well. I discussed with the patient and his wife that we really need to try to see what can be done in order to make sure this is pectin well and stays in. We also had an incident where the patient's wife has a picture of where a plastic bag containing Hydrofera Blue inside of it was actually taped to the wound on his gluteal region followed by an ABD pad secured with tape honestly if I did not see the pictures I would not do believe that she tells me that  she is actually going to head over to [facility] home office when she leaves here to have a discussion with somebody about this as that is unacceptable I agree. 12-24-2022 upon evaluation today patient appears to be doing well currently in regard to his heel which is looking better the sacral area is not looking quite as good. It is not infected and is not worse but is also not a lot improved compared to last time I saw him. Again he still is not staying off of this asking today how he was doing with staying off of his gluteal region and offloading and he tells me not very good. 01-14-2023 upon evaluation today patient appears to be doing well currently in regard to his sacral wound which I feel like is making some headway towards closure little by little. Unfortunately his heel is actually significantly worse compared to last I saw him. Fortunately I do not see any signs of infection at this time. 02-04-23 evaluation today patient appears to be doing well with regards to the sacral wound was not doing quite as good. Fortunately I do not see any signs of infection at either site right now although we will keep a close eye on this heal as we move forward. 8/27; this is a patient we follow for a sacral wound on the right heel wound which were pressure ulcers and this man who is fairly disabled. According to his wife he does not offload this properly he is up  sitting in his wheelchair quite a bit during the day. On the sacrum we are using Hydrofera Blue ABDs and on the heel Hydrofera Blue blue and gauze. In general the wounds I think are stable not changing too much recently. 04-01-2023 upon evaluation today patient appears to be doing well currently in regard to the sacral wound which is not worse but is also not better. It has been about a month since have seen him as he was septic due to UTI and ended up in rehab following due to being weak. With that being said he does have a new wound on the left lower  extremity which is secondary to having been placed in the wheelchair left they are too long during the time he was in rehab according to his wife. 04-22-2023 upon evaluation today patient appears to be doing well currently in regard to his wounds which all actually appear to be doing a little bit better if not completely better with the ankle already being healed. With that being said he was in the hospital from October 11 through October 19 due to what is stated to be sepsis secondary to catheter associated UTI specifically Enterococcus is what was mentioned. With that being said he seems to be doing much better at this point. They do have a Hoyer at home which has been of great benefit and he is staying off of this more at home which I think has helped all of his wounds as well. Fortunately I do not see any signs of active infection locally or systemically at the moment which is great news and I do believe that he is doing well in that regard. 05-13-2023 upon evaluation today patient's wounds unfortunately do not appear to be doing quite as well as they were last week. I do not see any signs of infection and in fact has not been just a week but about 3 weeks since I last saw him. Fortunately I do not see any evidence of worsening as far as infection is concerned but I did see signs of deep tissue injury to the heel and some expansion of the outer rim of the wound on the sacral location. Objective Constitutional Well-nourished and well-hydrated in no acute distress. Vitals Time Taken: 9:08 AM, Height: 66 in, Weight: 279 lbs, BMI: 45, Temperature: 97.8 F, Pulse: 91 bpm, Respiratory Rate: 18 breaths/min, Blood Pressure: 107/70 mmHg. Respiratory normal breathing without difficulty. Psychiatric this patient is able to make decisions and demonstrates good insight into disease process. Alert and Oriented x 3. pleasant and cooperative. General Notes: Patient's wounds do not appear to be doing quite as  well as they were 3 weeks ago when I last saw him. With that being said I do believe that a lot of this has to do with the fact that he is having issues here with ongoing pressure to the areas. I again discussed with him appropriate offloading he needs to try as much as possible to stay off of the wounds. Integumentary (Hair, Skin) Wound #5 status is Open. Original cause of wound was Pressure Injury. The date acquired was: 07/01/2021. The wound has been in treatment 76 weeks. The wound is located on the Right Calcaneus. The wound measures 2.5cm length x 2cm width x 0.3cm depth; 3.927cm^2 area and 1.178cm^3 volume. There is Fat Layer (Subcutaneous Tissue) exposed. There is no tunneling or undermining noted. There is a medium amount of serosanguineous drainage noted. There is small (1-33%) red granulation within the wound bed.  There is a large (67-100%) amount of necrotic tissue within the wound bed including Adherent Slough. Wound #7 status is Open. Original cause of wound was Pressure Injury. The date acquired was: 07/01/2021. The wound has been in treatment 76 weeks. The wound is located on the Sacrum. The wound measures 3.3cm length x 3cm width x 3.4cm depth; 7.775cm^2 area and 26.437cm^3 volume. There is Fat Layer (Subcutaneous Tissue) exposed. There is no tunneling or undermining noted. There is a medium amount of serosanguineous drainage noted. There is large (67- 100%) red granulation within the wound bed. There is no necrotic tissue within the wound bed. YOUSSOUF, SATTLER ([id number]) [id number] Page 8 of 9 Assessment Active Problems ICD-10 Pressure ulcer of sacral region, stage 4 Pressure ulcer of right heel, stage 3 Non-pressure chronic ulcer of other part of left lower leg with fat layer exposed Muscle weakness (generalized) Bipolar disorder, unspecified Type 2 diabetes mellitus with other skin ulcer Essential (primary) hypertension Long term (current)  use of anticoagulants Personal history of traumatic brain injury Plan 1. Based on what I am seeing I do believe that the patient would benefit from a continuation of aggressive offloading. I think this regimen is gena be the only thing that keeps him from breaking down further. 2. I am going to recommend as well that the patient should continue to wear the offloading boot for his heel am unsure whether this was happening with his wife was in the hospital she had issues with diverticulitis which ended up with her being in the hospital for several days and during that time the nurses that were taking care of him and not sure if they were actually using the boot. We will see patient back for reevaluation in 1 week here in the clinic. If anything worsens or changes patient will contact our office for additional recommendations. Electronic Signature(s) Signed: 05/13/2023 10:05:04 AM By: Allen Derry PA-C Entered By: Allen Derry on 05/13/2023 07:05:04 -------------------------------------------------------------------------------- SuperBill Details Patient Name: Date of Service: [name] 05/13/2023 Medical Record Number: [id number] Patient Account Number: [id number] Date of Birth/Sex: Treating RN: 01/23/1956 (67 y.o. [name]) [name] Primary Care Provider: Dione Booze Other Clinician: Referring Provider: Treating Provider/Extender: [name] in Treatment: 76 Diagnosis Coding ICD-10 Codes Code Description L89.154 Pressure ulcer of sacral region, stage 4 L89.613 Pressure ulcer of right heel, stage 3 L97.822 Non-pressure chronic ulcer of other part of left lower leg with fat layer exposed M62.81 Muscle weakness (generalized) F31.9 Bipolar disorder, unspecified E11.622 Type 2 diabetes mellitus with other skin ulcer I10 Essential (primary) hypertension Z79.01 Long term (current) use of anticoagulants Z87.820 Personal history of traumatic brain  injury Pylant, [name] ([id number]) [phone].pdf Page 9 of 9 Physician Procedures : CPT4 Code Description Modifier [id number] [phone] - WC PHYS LEVEL 3 - EST PT ICD-10 Diagnosis Description L89.154 Pressure ulcer of sacral region, stage 4 L89.613 Pressure ulcer of right heel, stage 3 L97.822 Non-pressure chronic ulcer of other part of left  lower leg with fat layer exposed M62.81 Muscle weakness (generalized) Quantity: 1 Electronic Signature(s) Signed: 05/13/2023 10:05:20 AM By: Allen Derry PA-C Entered By: Allen Derry on 05/13/2023 07:05:20

## 2023-05-13 NOTE — Progress Notes (Signed)
05/13/2023 1:39 PM   [name] Mar 21, 1956 [id number]  Referring provider: Dione Booze, MD [address] [place],  [place] [address]  Urological history: 1.  Neurogenic bladder -Since early 2000 secondary to TBI -Managed by indwelling Foley  2. High risk  hematuria  -non-smoker -non contrast CT (01/2023) - mild hydronephrosis -cysto/right retrograde (09/2022) -J hooking of right distal ureter, no tumors and wide open prostatic fossa from the prior TURP  Chief Complaint  Patient presents with   Recurrent UTI   HPI: [name] is a 67 y.o. male who presents today for possible UTI with his wife, [name].    Previous records reviewed.   History is obtained from his wife.  He started developing diarrhea yesterday which is his typical sign for UTI's.  He also has been having malaise and having some minor issues with following commands.  Patient denies any modifying or aggravating factors.  Patient denies any recent UTI's, gross hematuria, dysuria or suprapubic/flank pain.  Patient denies any fevers, chills, nausea or vomiting.    UA grossly infected   PMH: Past Medical History:  Diagnosis Date   Acute exacerbation of CHF (congestive heart failure) (HCC)    Anemia    Bipolar disorder (HCC)    Bladder mass    Cellulitis    Chronic anticoagulation    Chronic indwelling Foley catheter    CKD (chronic kidney disease) stage 3, GFR 30-59 ml/min (HCC)    Colostomy in place (HCC)    Diabetes mellitus without complication (HCC)    Frontotemporal dementia (HCC)    Heel ulcer (HCC)    Left   Hematuria    History of blood clots    Hydronephrosis of right kidney    Hyperkalemia    Hypertension    Hyponatremia    Lithium toxicity    Metabolic acidosis    Neurogenic bladder    Obesity    Sacral decubitus ulcer    Sepsis (HCC)    TBI (traumatic brain injury) (HCC)    UTI (urinary tract infection)    VRE (vancomycin resistant enterococcus) culture  positive     Surgical History: Past Surgical History:  Procedure Laterality Date   BACK SURGERY     CARPAL TUNNEL RELEASE Bilateral    COLON SURGERY     COLOSTOMY     CYSTOSCOPY W/ RETROGRADES Right 10/25/2022   Procedure: CYSTOSCOPY WITH RETROGRADE PYELOGRAM;  Surgeon: Sondra Come, MD;  Location: [facility] ORS;  Service: Urology;  Laterality: Right;   IR PERC TUN PERIT CATH WO PORT S&I /IMAG  10/11/2021   IR REMOVAL TUN CV CATH W/O FL  11/19/2021   IR US GUIDE VASC ACCESS RIGHT  10/11/2021   SACRAL DECUBITUS ULCER EXCISION     TONSILLECTOMY      Home Medications:  Allergies as of 05/13/2023   No Known Allergies      Medication List        Accurate as of May 13, 2023  1:39 PM. If you have any questions, ask your nurse or doctor.          amLODipine-olmesartan 5-20 MG tablet Commonly known as: AZOR Take 1 tablet by mouth daily.   cyanocobalamin 1000 MCG tablet Commonly known as: VITAMIN B12 Take 3,000 mcg by mouth daily.   Dexcom G7 Receiver Devi UAD for blood sugar monitoring.   Eliquis 5 MG Tabs tablet Generic drug: apixaban Take 5 mg by mouth 2 (two) times daily.   fish oil-omega-3 fatty  acids 1000 MG capsule Take 1 g by mouth 3 (three) times a week.   furosemide 40 MG tablet Commonly known as: LASIX Take 1 tablet (40 mg total) by mouth daily.   glipiZIDE 2.5 MG 24 hr tablet Commonly known as: GLUCOTROL XL Take 2.5 mg by mouth daily.   memantine 10 MG tablet Commonly known as: NAMENDA Take 10 mg by mouth 2 (two) times daily.   Oxcarbazepine 300 MG tablet Commonly known as: TRILEPTAL Take 1 tablet (300 mg total) by mouth 2 (two) times daily.   QUEtiapine 25 MG tablet Commonly known as: SEROQUEL TAKE 1 TO 2 TABLETS BY MOUTH ONCE DAILY AS NEEDED What changed: See the new instructions.   rosuvastatin 20 MG tablet Commonly known as: CRESTOR Take 20 mg by mouth at bedtime.   sodium bicarbonate 650 MG tablet Take 1 tablet (650 mg total) by  mouth daily.   sulfamethoxazole-trimethoprim 800-160 MG tablet Commonly known as: BACTRIM DS Take 1 tablet by mouth every 12 (twelve) hours. Started by: Michiel Cowboy   vitamin D3 25 MCG tablet Commonly known as: CHOLECALCIFEROL Take 1 tablet (1,000 Units total) by mouth daily.        Allergies: No Known Allergies  Family History: Family History  Problem Relation Age of Onset   Depression Father    Deep vein thrombosis Father     Social History:  reports that he has never smoked. He has never been exposed to tobacco smoke. He has never used smokeless tobacco. He reports that he does not currently use alcohol. He reports that he does not use drugs.  ROS: Pertinent ROS in HPI  Physical Exam: BP 118/70   Pulse 85   Ht 5\' 6"  (1.676 m)   Wt 300 lb (136.1 kg)   BMI 48.42 kg/m   Constitutional:  Well nourished. Alert and oriented, No acute distress. HEENT: [place] AT, moist mucus membranes.  Trachea midline Cardiovascular: No clubbing, cyanosis, or edema. GU: No CVA tenderness.  No bladder fullness or masses.  Patient with circumcised phallus.  Urethral meatus is patent with some mild urethral erosion ventrally.   No penile discharge. No penile lesions or rashes.  Neurologic: Grossly intact, no focal deficits, moving all 4 extremities. Psychiatric: Normal mood and affect.  Laboratory Data: Lab Results  Component Value Date   WBC 6.2 04/19/2023   HGB 8.9 (L) 04/19/2023   HCT 28.9 (L) 04/19/2023   MCV 86.5 04/19/2023   PLT 233 04/19/2023   Lab Results  Component Value Date   CREATININE 2.09 (H) 04/16/2023   Lab Results  Component Value Date   HGBA1C 7.0 (H) 09/19/2022   Lab Results  Component Value Date   AST 20 04/12/2023   Lab Results  Component Value Date   ALT 16 04/12/2023   Urinalysis See HPI and EPIC I have reviewed the labs.   Pertinent Imaging: N/A  Cath Change/ Replacement Patient is present today for a catheter change due to urinary  retention.  8 ml of water was removed from the balloon, a 16 FR foley cath was removed without difficulty.  Patient was cleaned and prepped in a sterile fashion with betadine and 2% lidocaine jelly was instilled into the urethra. A 16 FR foley cath was replaced into the bladder, no complications were noted. Urine return was noted 30 ml and urine was yellow cloudy in color. The balloon was filled with 10ml of sterile water. A leg bag was attached for drainage.  A night bag was  also given to the patient and patient was given instruction on how to change from one bag to another. Patient was given proper instruction on catheter care.    Performed by: Michiel Cowboy, PA-C and Annabell Sabal, CMA   Assessment & Plan:    1. Suspected UTI -UA grossly infected  -Urine culture pending -Started empirically on Septra DS twice daily for seven days, will adjust if necessary once urine culture and sensitivity results are available    2. Neurogenic bladder -Foley replaced today   Return for pending urine culture results .  These notes generated with voice recognition software. I apologize for typographical errors.  Cloretta Ned  [facility] [address]  [address] [place], [place] [address] [phone]

## 2023-05-14 NOTE — Progress Notes (Signed)
[name] ([id number]) [id number] Page 1 of 10 Visit Report for 05/13/2023 Arrival Information Details Patient Name: Date of Service: [name] [facility] 05/13/2023 8:45 A M Medical Record Number: [id number] Patient Account Number: [id number] Date of Birth/Sex: Treating RN: [birth year] (66 y.o. [name]) [name] Primary Care Nila Winker: Dione Booze Other Clinician: Referring Mubashir Mallek: Treating Claribel Sachs/Extender: [name] in Treatment: 65 Visit Information History Since Last Visit Added or deleted any medications: No Patient Arrived: Wheel Chair Any new allergies or adverse reactions: No Arrival Time: 09:07 Had a fall or experienced change in No Accompanied By: wife activities of daily living that may affect Transfer Assistance: None risk of falls: Patient Identification Verified: Yes Signs or symptoms of abuse/neglect since last visito No Secondary Verification Process Completed: Yes Hospitalized since last visit: No Patient Requires Transmission-Based Precautions: No Implantable device outside of the clinic excluding No Patient Has Alerts: Yes cellular tissue based products placed in the center Patient Alerts: Patient on Blood Thinner since last visit: Has Dressing in Place as Prescribed: Yes Pain Present Now: No Electronic Signature(s) Signed: 05/14/2023 4:42:48 PM By: [name] RN Entered By: [name] on 05/13/2023 06:08:22 -------------------------------------------------------------------------------- Clinic Level of Care Assessment Details Patient Name: Date of Service: [name] [facility] 05/13/2023 8:45 A M Medical Record Number: [id number] Patient Account Number: [id number] Date of Birth/Sex: Treating RN: 1955-11-01 (66 y.o. [name]) [name] Primary Care Evalynne Locurto: Dione Booze Other Clinician: Referring Joven Mom: Treating Roshan Salamon/Extender: [name] in  Treatment: 76 Clinic Level of Care Assessment Items TOOL 4 Quantity Score X- 1 0 Use when only an EandM is performed on FOLLOW-UP visit ASSESSMENTS - Nursing Assessment / Reassessment X- 1 10 Reassessment of Co-morbidities (includes updates in patient status) X- 1 5 Reassessment of Adherence to Treatment Plan [name] ([id number]) [id number] Page 2 of 10 ASSESSMENTS - Wound and Skin A ssessment / Reassessment []  - Simple Wound Assessment / Reassessment - one wound 0 X- 2 5 Complex Wound Assessment / Reassessment - multiple wounds []  - 0 Dermatologic / Skin Assessment (not related to wound area) ASSESSMENTS - Focused Assessment []  - 0 Circumferential Edema Measurements - multi extremities []  - 0 Nutritional Assessment / Counseling / Intervention []  - 0 Lower Extremity Assessment (monofilament, tuning fork, pulses) []  - 0 Peripheral Arterial Disease Assessment (using hand held doppler) ASSESSMENTS - Ostomy and/or Continence Assessment and Care []  - 0 Incontinence Assessment and Management []  - 0 Ostomy Care Assessment and Management (repouching, etc.) PROCESS - Coordination of Care X - Simple Patient / Family Education for ongoing care 1 15 []  - 0 Complex (extensive) Patient / Family Education for ongoing care []  - 0 Staff obtains Chiropractor, Records, T Results / Process Orders est []  - 0 Staff telephones HHA, Nursing Homes / Clarify orders / etc []  - 0 Routine Transfer to another Facility (non-emergent condition) []  - 0 Routine Hospital Admission (non-emergent condition) []  - 0 New Admissions / Manufacturing engineer / Ordering NPWT Apligraf, etc. , []  - 0 Emergency Hospital Admission (emergent condition) X- 1 10 Simple Discharge Coordination []  - 0 Complex (extensive) Discharge Coordination PROCESS - Special Needs []  - 0 Pediatric / Minor Patient Management []  - 0 Isolation Patient Management []  - 0 Hearing / Language  / Visual special needs []  - 0 Assessment of Community assistance (transportation, D/[name] planning, etc.) []  - 0 Additional assistance / Altered mentation []  - 0 Support Surface(s) Assessment (bed, cushion, seat, etc.) INTERVENTIONS - Wound Cleansing / Measurement []  -  0 Simple Wound Cleansing - one wound X- 2 5 Complex Wound Cleansing - multiple wounds X- 1 5 Wound Imaging (photographs - any number of wounds) []  - 0 Wound Tracing (instead of photographs) []  - 0 Simple Wound Measurement - one wound X- 2 5 Complex Wound Measurement - multiple wounds INTERVENTIONS - Wound Dressings X - Small Wound Dressing one or multiple wounds 2 10 []  - 0 Medium Wound Dressing one or multiple wounds []  - 0 Large Wound Dressing one or multiple wounds []  - 0 Application of Medications - topical []  - 0 Application of Medications - injection INTERVENTIONS - Miscellaneous []  - 0 External ear exam [name] [name] ([id number]) [id number] Page 3 of 10 []  - 0 Specimen Collection (cultures, biopsies, blood, body fluids, etc.) []  - 0 Specimen(s) / Culture(s) sent or taken to Lab for analysis []  - 0 Patient Transfer (multiple staff / Michiel Sites Lift / Similar devices) []  - 0 Simple Staple / Suture removal (25 or less) []  - 0 Complex Staple / Suture removal (26 or more) []  - 0 Hypo / Hyperglycemic Management (close monitor of Blood Glucose) []  - 0 Ankle / Brachial Index (ABI) - do not check if billed separately X- 1 5 Vital Signs Has the patient been seen at the hospital within the last three years: Yes Total Score: 100 Level Of Care: New/Established - Level 3 Electronic Signature(s) Signed: 05/14/2023 4:42:48 PM By: [name] RN Entered By: [name] on 05/13/2023 10:10:57 -------------------------------------------------------------------------------- Encounter Discharge Information Details Patient Name: Date of Service: [name] [facility] 05/13/2023  8:45 A M Medical Record Number: [id number] Patient Account Number: [id number] Date of Birth/Sex: Treating RN: 1955-11-13 (66 y.o. [name] Primary Care Juanice Warburton: Dione Booze Other Clinician: Referring Jourdon Zimmerle: Treating Chiann Goffredo/Extender: [name] in Treatment: 76 Encounter Discharge Information Items Discharge Condition: Stable Ambulatory Status: Wheelchair Discharge Destination: Home Transportation: Private Auto Accompanied By: wife Schedule Follow-up Appointment: Yes Clinical Summary of Care: Electronic Signature(s) Signed: 05/13/2023 1:10:19 PM By: [name] RN Entered By: [name] on 05/13/2023 10:10:18 -------------------------------------------------------------------------------- Lower Extremity Assessment Details Patient Name: Date of Service: [name] [facility] 05/13/2023 8:45 [address], [name] ([id number]) [id number] Page 4 of 10 Medical Record Number: [id number] Patient Account Number: [id number] Date of Birth/Sex: Treating RN: Nov 03, 1955 (66 y.o. [name]) [name] Primary Care Asaf Elmquist: Dione Booze Other Clinician: Referring Elenna Spratling: Treating Ketih Goodie/Extender: [name] in Treatment: 18 Electronic Signature(s) Signed: 05/14/2023 4:42:48 PM By: [name] RN Entered By: [name] on 05/13/2023 06:10:23 -------------------------------------------------------------------------------- Multi Wound Chart Details Patient Name: Date of Service: [name] [facility] 05/13/2023 8:45 A M Medical Record Number: [id number] Patient Account Number: [id number] Date of Birth/Sex: Treating RN: 07/23/1955 (66 y.o. [name]) [name] Primary Care Lorijean Husser: Dione Booze Other Clinician: Referring Ashan Cueva: Treating Erikah Thumm/Extender: [name] in Treatment: 76 Vital Signs Height(in): 66 Pulse(bpm): 91 Weight(lbs): 279 Blood  Pressure(mmHg): 107/70 Body Mass Index(BMI): 45 Temperature(F): 97.8 Respiratory Rate(breaths/min): 18 [5:Photos:] [N/A:N/A] Right Calcaneus Sacrum N/A Wound Location: Pressure Injury Pressure Injury N/A Wounding Event: Pressure Ulcer Pressure Ulcer N/A Primary Etiology: Anemia, Sleep Apnea, Coronary Artery Anemia, Sleep Apnea, Coronary Artery N/A Comorbid History: Disease, Deep Vein Thrombosis, Disease, Deep Vein Thrombosis, Hypertension, Type II Diabetes, Hypertension, Type II Diabetes, History of pressure wounds, History of pressure wounds, Neuropathy Neuropathy 07/01/2021 07/01/2021 N/A Date Acquired: 42 76 N/A Weeks of Treatment: Open Open N/A Wound Status: No No N/A Wound Recurrence: 2.5x2x0.3 3.3x3x3.4 N/A Measurements L x  W x D (cm) 3.927 7.775 N/A A (cm) : rea 1.178 26.437 N/A Volume (cm) : 5.10% -147.50% N/A % Reduction in A rea: -184.50% -180.50% N/A % Reduction in Volume: Category/Stage III Category/Stage IV N/A Classification: Medium Medium N/A Exudate A mount: Serosanguineous Serosanguineous N/A Exudate Type: red, brown red, brown N/A Exudate Color: Small (1-33%) Large (67-100%) N/A Granulation A mount: Red Red N/A Granulation Quality: Large (67-100%) None Present (0%) N/A Necrotic A mount: Fat Layer (Subcutaneous Tissue): Yes Fat Layer (Subcutaneous Tissue): Yes N/A Exposed Structures: Fascia: No Fascia: No Lybeck, [name] ([id number]) [id number] Page 5 of 10 Tendon: No Tendon: No Muscle: No Muscle: No Joint: No Joint: No Bone: No Bone: No Large (67-100%) None N/A Epithelialization: Treatment Notes Electronic Signature(s) Signed: 05/14/2023 4:42:48 PM By: [name] RN Entered By: [name] on 05/13/2023 06:10:27 -------------------------------------------------------------------------------- Multi-Disciplinary Care Plan Details Patient Name: Date of Service: [name] [facility] 05/13/2023  8:45 A M Medical Record Number: [id number] Patient Account Number: [id number] Date of Birth/Sex: Treating RN: 03/03/1956 (66 y.o. [name]) [name] Primary Care Clebert Wenger: Dione Booze Other Clinician: Referring Celso Granja: Treating Katelan Hirt/Extender: [name] in Treatment: 76 Active Inactive Wound/Skin Impairment Nursing Diagnoses: Knowledge deficit related to ulceration/compromised skin integrity Goals: Patient/caregiver will verbalize understanding of skin care regimen Date Initiated: 11/22/2021 Target Resolution Date: 05/26/2023 Goal Status: Active Ulcer/skin breakdown will have a volume reduction of 30% by week 4 Date Initiated: 11/22/2021 Date Inactivated: 01/10/2022 Target Resolution Date: 12/23/2021 Goal Status: Unmet Unmet Reason: comorbities Ulcer/skin breakdown will have a volume reduction of 50% by week 8 Date Initiated: 11/22/2021 Date Inactivated: 05/07/2022 Target Resolution Date: 01/22/2022 Goal Status: Unmet Unmet Reason: comorbidities Ulcer/skin breakdown will have a volume reduction of 80% by week 12 Date Initiated: 11/22/2021 Date Inactivated: 05/07/2022 Target Resolution Date: 02/22/2022 Goal Status: Unmet Unmet Reason: comorbidities Ulcer/skin breakdown will heal within 14 weeks Date Initiated: 11/22/2021 Date Inactivated: 05/07/2022 Target Resolution Date: 03/25/2022 Goal Status: Unmet Unmet Reason: comorbidities Interventions: Assess patient/caregiver ability to obtain necessary supplies Assess patient/caregiver ability to perform ulcer/skin care regimen upon admission and as needed Assess ulceration(s) every visit Notes: Electronic Signature(s) Signed: 05/14/2023 4:42:48 PM By: [name] RN Entered By: [name] on 05/13/2023 06:10:37 Fleet, [name] ([id number]) [id number] Page 6 of 10 -------------------------------------------------------------------------------- Pain Assessment  Details Patient Name: Date of Service: [name] [facility] 05/13/2023 8:45 A M Medical Record Number: [id number] Patient Account Number: [id number] Date of Birth/Sex: Treating RN: [birth year] (66 y.o. [name]) [name] Primary Care Nasiah Polinsky: Dione Booze Other Clinician: Referring Heath Badon: Treating Vuong Musa/Extender: [name] in Treatment: 57 Active Problems Location of Pain Severity and Description of Pain Patient Has Paino No Site Locations Pain Management and Medication Current Pain Management: Electronic Signature(s) Signed: 05/14/2023 4:42:48 PM By: [name] RN Entered By: [name] on 05/13/2023 06:08:51 -------------------------------------------------------------------------------- Patient/Caregiver Education Details Patient Name: Date of Service: [name] [facility] 11/12/2024andnbsp8:45 A M Medical Record Number: [id number] Patient Account Number: [id number] Date of Birth/Gender: Treating RN: [birth year] (66 y.o. [name] Primary Care Physician: Dione Booze Other Clinician: Referring Physician: Treating Physician/Extender: [name] in Treatment: [address] [place] ([id number]) [id number] Page 7 of 10 Education Assessment Education Provided To: Patient Education Topics Provided Pressure: Handouts: Pressure Injury: Prevention and Offloading Methods: Explain/Verbal Responses: State content correctly Electronic Signature(s) Signed: 05/14/2023 4:42:48 PM By: [name] RN Entered By: [name] on 05/13/2023 06:10:52 -------------------------------------------------------------------------------- Wound Assessment Details Patient Name: Date of Service: [name] [facility]  05/13/2023 8:45 A M Medical Record Number: [id number] Patient Account Number: [id number] Date of Birth/Sex: Treating RN: August 04, 1955 (66 y.o. [name]) [name] Primary Care  Camry Robello: Dione Booze Other Clinician: Referring Julie Paolini: Treating Nadia Torr/Extender: [name] in Treatment: 76 Wound Status Wound Number: 5 Primary Pressure Ulcer Etiology: Wound Location: Right Calcaneus Wound Open Wounding Event: Pressure Injury Status: Date Acquired: 07/01/2021 Comorbid Anemia, Sleep Apnea, Coronary Artery Disease, Deep Vein Weeks Of Treatment: 76 History: Thrombosis, Hypertension, Type II Diabetes, History of pressure Clustered Wound: No wounds, Neuropathy Photos Wound Measurements Length: (cm) 2.5 Width: (cm) 2 Depth: (cm) 0.3 Area: (cm) 3.927 Volume: (cm) 1.178 % Reduction in Area: 5.1% % Reduction in Volume: -184.5% Epithelialization: Large (67-100%) Tunneling: No Undermining: No Wound Description Classification: Category/Stage III Exudate Amount: Medium Exudate Type: Serosanguineous [name], [name] ([id number]) Exudate Color: red, brown Foul Odor After Cleansing: No Slough/Fibrino Yes [id number] Page 8 of 10 Wound Bed Granulation Amount: Small (1-33%) Exposed Structure Granulation Quality: Red Fascia Exposed: No Necrotic Amount: Large (67-100%) Fat Layer (Subcutaneous Tissue) Exposed: Yes Necrotic Quality: Adherent Slough Tendon Exposed: No Muscle Exposed: No Joint Exposed: No Bone Exposed: No Treatment Notes Wound #5 (Calcaneus) Wound Laterality: Right Cleanser Normal Saline Discharge Instruction: Wash your hands with soap and water. Remove old dressing, discard into plastic bag and place into trash. Cleanse the wound with Normal Saline prior to applying a clean dressing using gauze sponges, not tissues or cotton balls. Do not scrub or use excessive force. Pat dry using gauze sponges, not tissue or cotton balls. Soap and Water Discharge Instruction: Gently cleanse wound with antibacterial soap, rinse and pat dry prior to dressing wounds Peri-Wound Care Topical Primary  Dressing Hydrofera Blue Ready Transfer Foam, 2.5x2.5 (in/in) Discharge Instruction: cut to size of wound Secondary Dressing Gauze Discharge Instruction: As directed: dry, moistened with saline or moistened with Dakins Solution Secured With Medipore T - 60M Medipore H Soft Cloth Surgical T ape ape, 2x2 (in/yd) Compression Wrap Compression Stockings Add-Ons Electronic Signature(s) Signed: 05/14/2023 4:42:48 PM By: [name] RN Entered By: [name] on 05/13/2023 06:09:55 -------------------------------------------------------------------------------- Wound Assessment Details Patient Name: Date of Service: [name] [facility] 05/13/2023 8:45 A M Medical Record Number: [id number] Patient Account Number: [id number] Date of Birth/Sex: Treating RN: 23-Feb-1956 (66 y.o. [name] Primary Care Neta Upadhyay: Dione Booze Other Clinician: Referring Disney Ruggiero: Treating Hagop Mccollam/Extender: [name] in Treatment: 76 Wound Status Wound Number: 7 Primary Pressure Ulcer Etiology: Wound Location: Sacrum Wound Open Wounding Event: Pressure Injury Status: Date Acquired: 07/01/2021 Comorbid Anemia, Sleep Apnea, Coronary Artery Disease, Deep Vein Weeks Of Treatment: [address], [place] ([id number]) [id number] Page 9 of 10 Weeks Of Treatment: 76 History: Thrombosis, Hypertension, Type II Diabetes, History of pressure Clustered Wound: No wounds, Neuropathy Photos Wound Measurements Length: (cm) 3.3 Width: (cm) 3 Depth: (cm) 3.4 Area: (cm) 7.775 Volume: (cm) 26.437 % Reduction in Area: -147.5% % Reduction in Volume: -180.5% Epithelialization: None Tunneling: No Undermining: No Wound Description Classification: Category/Stage IV Exudate Amount: Medium Exudate Type: Serosanguineous Exudate Color: red, brown Foul Odor After Cleansing: No Slough/Fibrino No Wound Bed Granulation Amount: Large (67-100%) Exposed  Structure Granulation Quality: Red Fascia Exposed: No Necrotic Amount: None Present (0%) Fat Layer (Subcutaneous Tissue) Exposed: Yes Tendon Exposed: No Muscle Exposed: No Joint Exposed: No Bone Exposed: No Treatment Notes Wound #7 (Sacrum) Cleanser Normal Saline Discharge Instruction: Wash your hands with soap and water. Remove old dressing, discard into plastic bag and place into trash. Cleanse the wound  with Normal Saline prior to applying a clean dressing using gauze sponges, not tissues or cotton balls. Do not scrub or use excessive force. Pat dry using gauze sponges, not tissue or cotton balls. Soap and Water Discharge Instruction: Gently cleanse wound with antibacterial soap, rinse and pat dry prior to dressing wounds Peri-Wound Care Topical calmoseptine Discharge Instruction: apply to excoriated areas / periwound Primary Dressing Hydrofera Blue Ready Transfer Foam, 4x5 (in/in) Discharge Instruction: Apply Hydrofera Blue Ready to wound bed as directed Secondary Dressing ABD Pad 5x9 (in/in) Discharge Instruction: Cover with ABD pad Secured With Compression Wrap Compression Stockings Add-Ons Electronic Signature(s) Signed: 05/14/2023 4:42:48 PM By: [name] RN [name], [name] ([id number]) [id number] Page 10 of 10 Signed: 05/14/2023 4:42:48 PM By: [name] RN Entered By: [name] on 05/13/2023 06:10:15 -------------------------------------------------------------------------------- Vitals Details Patient Name: Date of Service: [name] [facility] 05/13/2023 8:45 A M Medical Record Number: [id number] Patient Account Number: [id number] Date of Birth/Sex: Treating RN: July 14, 1955 (66 y.o. [name]) [name] Primary Care Janiyha Montufar: Dione Booze Other Clinician: Referring Salena Ortlieb: Treating Fredrik Mogel/Extender: [name] in Treatment: 76 Vital Signs Time Taken: 09:08 Temperature (F): 97.8 Height  (in): 66 Pulse (bpm): 91 Weight (lbs): 279 Respiratory Rate (breaths/min): 18 Body Mass Index (BMI): 45 Blood Pressure (mmHg): 107/70 Reference Range: 80 - 120 mg / dl Electronic Signature(s) Signed: 05/14/2023 4:42:48 PM By: [name] RN Entered By: [name] on 05/13/2023 06:08:45

## 2023-05-16 LAB — CULTURE, URINE COMPREHENSIVE

## 2023-06-03 ENCOUNTER — Encounter: Payer: Medicare Other | Attending: Physician Assistant | Admitting: Physician Assistant

## 2023-06-03 DIAGNOSIS — L97822 Non-pressure chronic ulcer of other part of left lower leg with fat layer exposed: Secondary | ICD-10-CM | POA: Diagnosis not present

## 2023-06-03 DIAGNOSIS — L89154 Pressure ulcer of sacral region, stage 4: Secondary | ICD-10-CM | POA: Insufficient documentation

## 2023-06-03 DIAGNOSIS — I10 Essential (primary) hypertension: Secondary | ICD-10-CM | POA: Insufficient documentation

## 2023-06-03 DIAGNOSIS — E1140 Type 2 diabetes mellitus with diabetic neuropathy, unspecified: Secondary | ICD-10-CM | POA: Diagnosis not present

## 2023-06-03 DIAGNOSIS — E1151 Type 2 diabetes mellitus with diabetic peripheral angiopathy without gangrene: Secondary | ICD-10-CM | POA: Insufficient documentation

## 2023-06-03 DIAGNOSIS — E11622 Type 2 diabetes mellitus with other skin ulcer: Secondary | ICD-10-CM | POA: Diagnosis present

## 2023-06-03 DIAGNOSIS — G4730 Sleep apnea, unspecified: Secondary | ICD-10-CM | POA: Diagnosis not present

## 2023-06-03 DIAGNOSIS — F319 Bipolar disorder, unspecified: Secondary | ICD-10-CM | POA: Insufficient documentation

## 2023-06-03 DIAGNOSIS — L89613 Pressure ulcer of right heel, stage 3: Secondary | ICD-10-CM | POA: Diagnosis not present

## 2023-06-03 NOTE — Progress Notes (Addendum)
[name] ([id number]) [id number] Page 1 of 10 Visit Report for 06/03/2023 Arrival Information Details Patient Name: Date of Service: [name] [facility] 06/03/2023 8:45 A M Medical Record Number: [id number] Patient Account Number: [id number] Date of Birth/Sex: Treating RN: [name] (66 y.o. [name]) [name] Primary Care [name]: Dione Booze Other Clinician: Referring [name]: Treating Gerlene Glassburn/Extender: [name] in Treatment: 28 Visit Information History Since Last Visit Added or deleted any medications: No Patient Arrived: Wheel Chair Any new allergies or adverse reactions: No Arrival Time: 09:03 Had a fall or experienced change in No Accompanied By: caregiver activities of daily living that may affect Transfer Assistance: None risk of falls: Patient Identification Verified: Yes Signs or symptoms of abuse/neglect since last visito No Secondary Verification Process Completed: Yes Hospitalized since last visit: No Patient Requires Transmission-Based Precautions: No Implantable device outside of the clinic excluding No Patient Has Alerts: Yes cellular tissue based products placed in the center Patient Alerts: Patient on Blood Thinner since last visit: Has Dressing in Place as Prescribed: Yes Pain Present Now: No Electronic Signature(s) Signed: 06/03/2023 3:36:18 PM By: [name] RN Entered By: [name] on 06/03/2023 12:36:18 -------------------------------------------------------------------------------- Clinic Level of Care Assessment Details Patient Name: Date of Service: [name] [facility] 06/03/2023 8:45 A M Medical Record Number: [id number] Patient Account Number: [id number] Date of Birth/Sex: Treating RN: [name] (66 y.o. [name]) [name] Primary Care Aldan Camey: Dione Booze Other Clinician: Referring [name]: Treating Tilden Broz/Extender: [name] in  Treatment: 79 Clinic Level of Care Assessment Items TOOL 4 Quantity Score X- 1 0 Use when only an EandM is performed on FOLLOW-UP visit ASSESSMENTS - Nursing Assessment / Reassessment X- 1 10 Reassessment of Co-morbidities (includes updates in patient status) X- 1 5 Reassessment of Adherence to Treatment Plan [name], [name] ([id number]) [id number] Page 2 of 10 ASSESSMENTS - Wound and Skin A ssessment / Reassessment []  - Simple Wound Assessment / Reassessment - one wound 0 X- 2 5 Complex Wound Assessment / Reassessment - multiple wounds []  - 0 Dermatologic / Skin Assessment (not related to wound area) ASSESSMENTS - Focused Assessment []  - 0 Circumferential Edema Measurements - multi extremities []  - 0 Nutritional Assessment / Counseling / Intervention []  - 0 Lower Extremity Assessment (monofilament, tuning fork, pulses) []  - 0 Peripheral Arterial Disease Assessment (using hand held doppler) ASSESSMENTS - Ostomy and/or Continence Assessment and Care []  - 0 Incontinence Assessment and Management []  - 0 Ostomy Care Assessment and Management (repouching, etc.) PROCESS - Coordination of Care X - Simple Patient / Family Education for ongoing care 1 15 []  - 0 Complex (extensive) Patient / Family Education for ongoing care []  - 0 Staff obtains Chiropractor, Records, T Results / Process Orders est []  - 0 Staff telephones HHA, Nursing Homes / Clarify orders / etc []  - 0 Routine Transfer to another Facility (non-emergent condition) []  - 0 Routine Hospital Admission (non-emergent condition) []  - 0 New Admissions / Manufacturing engineer / Ordering NPWT Apligraf, etc. , []  - 0 Emergency Hospital Admission (emergent condition) X- 1 10 Simple Discharge Coordination []  - 0 Complex (extensive) Discharge Coordination PROCESS - Special Needs []  - 0 Pediatric / Minor Patient Management []  - 0 Isolation Patient Management []  - 0 Hearing / Language  / Visual special needs []  - 0 Assessment of Community assistance (transportation, D/C planning, etc.) []  - 0 Additional assistance / Altered mentation []  - 0 Support Surface(s) Assessment (bed, cushion, seat, etc.) INTERVENTIONS - Wound Cleansing / Measurement []  -  0 Simple Wound Cleansing - one wound X- 2 5 Complex Wound Cleansing - multiple wounds X- 1 5 Wound Imaging (photographs - any number of wounds) []  - 0 Wound Tracing (instead of photographs) []  - 0 Simple Wound Measurement - one wound X- 2 5 Complex Wound Measurement - multiple wounds INTERVENTIONS - Wound Dressings X - Small Wound Dressing one or multiple wounds 2 10 []  - 0 Medium Wound Dressing one or multiple wounds []  - 0 Large Wound Dressing one or multiple wounds []  - 0 Application of Medications - topical []  - 0 Application of Medications - injection INTERVENTIONS - Miscellaneous []  - 0 External ear exam Weigold, [name] ([id number]) [id number] Page 3 of 10 []  - 0 Specimen Collection (cultures, biopsies, blood, body fluids, etc.) []  - 0 Specimen(s) / Culture(s) sent or taken to Lab for analysis []  - 0 Patient Transfer (multiple staff / Michiel Sites Lift / Similar devices) []  - 0 Simple Staple / Suture removal (25 or less) []  - 0 Complex Staple / Suture removal (26 or more) []  - 0 Hypo / Hyperglycemic Management (close monitor of Blood Glucose) []  - 0 Ankle / Brachial Index (ABI) - do not check if billed separately X- 1 5 Vital Signs Has the patient been seen at the hospital within the last three years: Yes Total Score: 100 Level Of Care: New/Established - Level 3 Electronic Signature(s) Signed: 06/03/2023 4:30:26 PM By: [name] RN Entered By: [name] on 06/03/2023 12:39:49 -------------------------------------------------------------------------------- Encounter Discharge Information Details Patient Name: Date of Service: [name] Sickles [facility] [place] 06/03/2023  8:45 A M Medical Record Number: [id number] Patient Account Number: [id number] Date of Birth/Sex: Treating RN: [birth year] [name] (66 y.o. [name]) [name] Primary Care Isabell Bonafede: Dione Booze Other Clinician: Referring Nani Ingram: Treating Cinque Begley/Extender: [name] in Treatment: 79 Encounter Discharge Information Items Discharge Condition: Stable Ambulatory Status: Wheelchair Discharge Destination: Home Transportation: Private Auto Accompanied By: caregiver Schedule Follow-up Appointment: Yes Clinical Summary of Care: Electronic Signature(s) Signed: 06/03/2023 3:41:11 PM By: [name] RN Entered By: [name] on 06/03/2023 12:41:09 -------------------------------------------------------------------------------- Lower Extremity Assessment Details Patient Name: Date of Service: [name] [facility] 06/03/2023 8:45 [address] [name] ([id number]) [id number] Page 4 of 10 Medical Record Number: [id number] Patient Account Number: [id number] Date of Birth/Sex: Treating RN: [birth year] [name] (66 y.o. [name]) [name] Primary Care Aaryanna Hyden: Dione Booze Other Clinician: Referring Kindrick Lankford: Treating Eirik Schueler/Extender: [name] in Treatment: 59 Electronic Signature(s) Signed: 06/03/2023 3:38:38 PM By: [name] RN Entered By: [name] on 06/03/2023 12:38:38 -------------------------------------------------------------------------------- Multi Wound Chart Details Patient Name: Date of Service: [name] [facility] 06/03/2023 8:45 A M Medical Record Number: [id number] Patient Account Number: [id number] Date of Birth/Sex: Treating RN: [name] (66 y.o. [name]) [name] Primary Care Saralynn Langhorst: Dione Booze Other Clinician: Referring Meri Pelot: Treating Aalivia Mcgraw/Extender: [name] in Treatment: 79 Vital Signs Height(in): 66 Pulse(bpm): 78 Weight(lbs): 279 Blood  Pressure(mmHg): 120/52 Body Mass Index(BMI): 45 Temperature(F): 97.7 Respiratory Rate(breaths/min): 18 [5:Photos:] [N/A:N/A] Right Calcaneus Sacrum N/A Wound Location: Pressure Injury Pressure Injury N/A Wounding Event: Pressure Ulcer Pressure Ulcer N/A Primary Etiology: Anemia, Sleep Apnea, Coronary Artery Anemia, Sleep Apnea, Coronary Artery N/A Comorbid History: Disease, Deep Vein Thrombosis, Disease, Deep Vein Thrombosis, Hypertension, Type II Diabetes, Hypertension, Type II Diabetes, History of pressure wounds, History of pressure wounds, Neuropathy Neuropathy 07/01/2021 07/01/2021 N/A Date Acquired: 40 79 N/A Weeks of Treatment: Open Open N/A Wound Status: No No N/A Wound Recurrence: 2.5x2x0.3 2.5x1.5x4.3 N/A Measurements L x  W x D (cm) 3.927 2.945 N/A A (cm) : rea 1.178 12.665 N/A Volume (cm) : 5.10% 6.30% N/A % Reduction in A rea: -184.50% -34.40% N/A % Reduction in Volume: Category/Stage III Category/Stage IV N/A Classification: Medium Medium N/A Exudate A mount: Serosanguineous Serosanguineous N/A Exudate Type: red, brown red, brown N/A Exudate Color: Small (1-33%) Large (67-100%) N/A Granulation A mount: Red Red N/A Granulation Quality: Large (67-100%) None Present (0%) N/A Necrotic A mount: Fat Layer (Subcutaneous Tissue): Yes Fat Layer (Subcutaneous Tissue): Yes N/A Exposed Structures: Fascia: No Fascia: No [name], [name] ([id number]) [id number] Page 5 of 10 Tendon: No Tendon: No Muscle: No Muscle: No Joint: No Joint: No Bone: No Bone: No Large (67-100%) None N/A Epithelialization: Treatment Notes Electronic Signature(s) Signed: 06/03/2023 3:38:45 PM By: [name] RN Entered By: [name] on 06/03/2023 12:38:44 -------------------------------------------------------------------------------- Multi-Disciplinary Care Plan Details Patient Name: Date of Service: [name] [facility] 06/03/2023 8:45 A  M Medical Record Number: [id number] Patient Account Number: [id number] Date of Birth/Sex: Treating RN: [name] (66 y.o. [name]) [name] Primary Care Krysta Bloomfield: Dione Booze Other Clinician: Referring Keigo Whalley: Treating Nery Kalisz/Extender: [name] in Treatment: 79 Active Inactive Wound/Skin Impairment Nursing Diagnoses: Knowledge deficit related to ulceration/compromised skin integrity Goals: Patient/caregiver will verbalize understanding of skin care regimen Date Initiated: 11/22/2021 Target Resolution Date: 05/26/2023 Goal Status: Active Ulcer/skin breakdown will have a volume reduction of 30% by week 4 Date Initiated: 11/22/2021 Date Inactivated: 01/10/2022 Target Resolution Date: 12/23/2021 Goal Status: Unmet Unmet Reason: comorbities Ulcer/skin breakdown will have a volume reduction of 50% by week 8 Date Initiated: 11/22/2021 Date Inactivated: 05/07/2022 Target Resolution Date: 01/22/2022 Goal Status: Unmet Unmet Reason: comorbidities Ulcer/skin breakdown will have a volume reduction of 80% by week 12 Date Initiated: 11/22/2021 Date Inactivated: 05/07/2022 Target Resolution Date: 02/22/2022 Goal Status: Unmet Unmet Reason: comorbidities Ulcer/skin breakdown will heal within 14 weeks Date Initiated: 11/22/2021 Date Inactivated: 05/07/2022 Target Resolution Date: 03/25/2022 Goal Status: Unmet Unmet Reason: comorbidities Interventions: Assess patient/caregiver ability to obtain necessary supplies Assess patient/caregiver ability to perform ulcer/skin care regimen upon admission and as needed Assess ulceration(s) every visit Notes: Electronic Signature(s) Signed: 06/03/2023 3:40:11 PM By: [name] RN Entered By: [name] on 06/03/2023 12:40:09 [name], [name] ([id number]) [id number] Page 6 of 10 -------------------------------------------------------------------------------- Pain Assessment Details Patient  Name: Date of Service: [name] [facility] 06/03/2023 8:45 A M Medical Record Number: [id number] Patient Account Number: [id number] Date of Birth/Sex: Treating RN: [name] (66 y.o. [name]) [name] Primary Care Adynn Caseres: Dione Booze Other Clinician: Referring Saira Kramme: Treating Mikaiah Stoffer/Extender: [name] in Treatment: 49 Active Problems Location of Pain Severity and Description of Pain Patient Has Paino No Site Locations Pain Management and Medication Current Pain Management: Electronic Signature(s) Signed: 06/03/2023 3:37:01 PM By: [name] RN Entered By: [name] on 06/03/2023 12:37:01 -------------------------------------------------------------------------------- Patient/Caregiver Education Details Patient Name: Date of Service: [name] [facility] 12/3/2024andnbsp8:45 A M Medical Record Number: [id number] Patient Account Number: [id number] Date of Birth/Gender: Treating RN: [birth year], [name] (66 y.o. [name] Primary Care Physician: Dione Booze Other Clinician: Referring Physician: Treating Physician/Extender: [name] in Treatment: [address], [place] ([id number]) [id number] Page 7 of 10 Education Assessment Education Provided To: Patient Education Topics Provided Pressure: Handouts: Pressure Injury: Prevention and Offloading Methods: Explain/Verbal Responses: State content correctly Electronic Signature(s) Signed: 06/03/2023 4:30:26 PM By: [name] RN Entered By: [name] on 06/03/2023 12:40:24 -------------------------------------------------------------------------------- Wound Assessment Details Patient Name: Date of Service: [name] [facility]  06/03/2023 8:45 A M Medical Record Number: [id number] Patient Account Number: [id number] Date of Birth/Sex: Treating RN: [birth year] (66 y.o. [name]) [name] Primary Care Chauncey Bruno: Dione Booze Other Clinician: Referring Jalea Bronaugh: Treating Derita Michelsen/Extender: [name] in Treatment: 79 Wound Status Wound Number: 5 Primary Pressure Ulcer Etiology: Wound Location: Right Calcaneus Wound Open Wounding Event: Pressure Injury Status: Date Acquired: 07/01/2021 Comorbid Anemia, Sleep Apnea, Coronary Artery Disease, Deep Vein Weeks Of Treatment: 79 History: Thrombosis, Hypertension, Type II Diabetes, History of pressure Clustered Wound: No wounds, Neuropathy Photos Wound Measurements Length: (cm) 2.5 Width: (cm) 2 Depth: (cm) 0.3 Area: (cm) 3.927 Volume: (cm) 1.178 % Reduction in Area: 5.1% % Reduction in Volume: -184.5% Epithelialization: Large (67-100%) Tunneling: No Undermining: No Wound Description Classification: Category/Stage III Exudate Amount: Medium Exudate Type: Serosanguineous [name], [name] ([id number]) Exudate Color: red, brown Foul Odor After Cleansing: No Slough/Fibrino Yes [id number] Page 8 of 10 Wound Bed Granulation Amount: Small (1-33%) Exposed Structure Granulation Quality: Red Fascia Exposed: No Necrotic Amount: Large (67-100%) Fat Layer (Subcutaneous Tissue) Exposed: Yes Necrotic Quality: Adherent Slough Tendon Exposed: No Muscle Exposed: No Joint Exposed: No Bone Exposed: No Treatment Notes Wound #5 (Calcaneus) Wound Laterality: Right Cleanser Normal Saline Discharge Instruction: Wash your hands with soap and water. Remove old dressing, discard into plastic bag and place into trash. Cleanse the wound with Normal Saline prior to applying a clean dressing using gauze sponges, not tissues or cotton balls. Do not scrub or use excessive force. Pat dry using gauze sponges, not tissue or cotton balls. Soap and Water Discharge Instruction: Gently cleanse wound with antibacterial soap, rinse and pat dry prior to dressing wounds Peri-Wound Care Topical Primary Dressing Hydrofera  Blue Ready Transfer Foam, 2.5x2.5 (in/in) Discharge Instruction: cut to size of wound Secondary Dressing Gauze Discharge Instruction: As directed: dry, moistened with saline or moistened with Dakins Solution Secured With Medipore T - 21M Medipore H Soft Cloth Surgical T ape ape, 2x2 (in/yd) Compression Wrap Compression Stockings Add-Ons Electronic Signature(s) Signed: 06/03/2023 3:38:06 PM By: [name] RN Entered By: [name] on 06/03/2023 12:38:05 -------------------------------------------------------------------------------- Wound Assessment Details Patient Name: Date of Service: [name] [facility] 06/03/2023 8:45 A M Medical Record Number: [id number] Patient Account Number: [id number] Date of Birth/Sex: Treating RN: [birth year][name] (66 y.o. [name] Primary Care Juell Radney: Dione Booze Other Clinician: Referring Salvator Seppala: Treating Maurianna Benard/Extender: [name] in Treatment: 79 Wound Status Wound Number: 7 Primary Pressure Ulcer Etiology: Wound Location: Sacrum Wound Open Wounding Event: Pressure Injury Status: Date Acquired: 07/01/2021 Comorbid Anemia, Sleep Apnea, Coronary Artery Disease, Deep Vein Weeks Of Treatment: [address] [name] ([id number]) [id number] Page 9 of 10 Weeks Of Treatment: 79 History: Thrombosis, Hypertension, Type II Diabetes, History of pressure Clustered Wound: No wounds, Neuropathy Photos Wound Measurements Length: (cm) 2.5 Width: (cm) 1.5 Depth: (cm) 4.3 Area: (cm) 2.945 Volume: (cm) 12.665 % Reduction in Area: 6.3% % Reduction in Volume: -34.4% Epithelialization: None Tunneling: No Undermining: No Wound Description Classification: Category/Stage IV Exudate Amount: Medium Exudate Type: Serosanguineous Exudate Color: red, brown Foul Odor After Cleansing: No Slough/Fibrino No Wound Bed Granulation Amount: Large (67-100%) Exposed Structure Granulation Quality:  Red Fascia Exposed: No Necrotic Amount: None Present (0%) Fat Layer (Subcutaneous Tissue) Exposed: Yes Tendon Exposed: No Muscle Exposed: No Joint Exposed: No Bone Exposed: No Treatment Notes Wound #7 (Sacrum) Cleanser Normal Saline Discharge Instruction: Wash your hands with soap and water. Remove old dressing, discard into plastic bag and place into trash. Cleanse the wound  with Normal Saline prior to applying a clean dressing using gauze sponges, not tissues or cotton balls. Do not scrub or use excessive force. Pat dry using gauze sponges, not tissue or cotton balls. Soap and Water Discharge Instruction: Gently cleanse wound with antibacterial soap, rinse and pat dry prior to dressing wounds Peri-Wound Care Topical calmoseptine Discharge Instruction: apply to excoriated areas / periwound Primary Dressing Hydrofera Blue Ready Transfer Foam, 4x5 (in/in) Discharge Instruction: Apply Hydrofera Blue Ready to wound bed as directed Secondary Dressing ABD Pad 5x9 (in/in) Discharge Instruction: Cover with ABD pad Secured With Compression Wrap Compression Stockings Add-Ons Electronic Signature(s) Signed: 06/03/2023 3:38:30 PM By: [name] RN Porcher, Rexford ([id number]) [id number] Page 10 of 10 Signed: 06/03/2023 3:38:30 PM By: [name] RN Entered By: [name] on 06/03/2023 12:38:28 -------------------------------------------------------------------------------- Vitals Details Patient Name: Date of Service: [name] [facility] 06/03/2023 8:45 A M Medical Record Number: [id number] Patient Account Number: [id number] Date of Birth/Sex: Treating RN: [birth year][name] (66 y.o. [name]) [name] Primary Care Kron Everton: Dione Booze Other Clinician: Referring Chauncey Bruno: Treating Bobbie Virden/Extender: [name] in Treatment: 79 Vital Signs Time Taken: 09:04 Temperature (F): 97.7 Height (in): 66 Pulse (bpm): 78 Weight  (lbs): 279 Respiratory Rate (breaths/min): 18 Body Mass Index (BMI): 45 Blood Pressure (mmHg): 120/52 Reference Range: 80 - 120 mg / dl Electronic Signature(s) Signed: 06/03/2023 3:36:52 PM By: [name] RN Entered By: [name] on 06/03/2023 12:36:52

## 2023-06-05 NOTE — Progress Notes (Signed)
[name] ([id number]) [id number] Page 1 of 11 Visit Report for 06/03/2023 Chief Complaint Document Details Patient Name: Date of Service: [name] [facility] 06/03/2023 8:45 A M Medical Record Number: [id number] Patient Account Number: [id number] Date of Birth/Sex: Treating RN: 04-18-1956 (67 y.o. [name]) [name] Primary Care Provider: Dione Booze Other Clinician: Referring Provider: Treating Provider/Extender: [name] in Treatment: 79 Information Obtained from: Patient Chief Complaint Sacral, right heel, and left leg ulcers Electronic Signature(s) Signed: 06/03/2023 5:39:03 PM By: Allen Derry PA-C Entered By: Allen Derry on 06/03/2023 14:39:03 -------------------------------------------------------------------------------- HPI Details Patient Name: Date of Service: [name] [employer] 06/03/2023 8:45 A M Medical Record Number: [id number] Patient Account Number: [id number] Date of Birth/Sex: Treating RN: December 30, 1955 (67 y.o. [name]) [name] Primary Care Provider: Dione Booze Other Clinician: Referring Provider: Treating Provider/Extender: [name] in Treatment: 58 History of Present Illness HPI Description: 05/29/2021 this is a patient who presents today for initial inspection here in the clinic concerning wounds that he has over the right heel and the sacral region. Unfortunately the sacral wound is starting to spread off to the right gluteal location due to how he sits always leaning towards the right side in his chair. His wife is present she is the primary caregiver though she is not home with him all the time she does have to work. She does do an excellent job however it appears to be in trying to keep things under good control for him. The patient is not able to change positions himself nor walk by himself so he is pretty much at the mercy of the position he is putting  when she is gone and this tends to be his chair which she sits and most of the day. Obviously this I think is the main culprit for what is going on currently. It was actually in January 2020 when the sacral wound started. It was in September 2022 when the wound started to spread more to the right gluteal location. Subsequently in August 2022 is when he had been in a skilled nursing facility and the heel started to give him trouble as well. That does not seem to be doing nearly as poorly as the sacral region. He was hospitalized in October 2022 secondary to sepsis and this was in regard to the foot and was sent to skilled nursing again he is now back at home. He did have a wound VAC for the sacral wound over the summer 2022 but being in and out of facilities this ended up getting sent back. The patient does have [facility] home health that comes out 1 time per week to help with care. His most recent hemoglobin A1c was 6.9 in August 2022. 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 s. [name] ([id number]) [id number] Page 2 of 11 07/24/2021 upon evaluation today patient appears to be doing decently well in regard to the pressure ulcer on the right heel as well as the sacral region. In general I think you are making some progress here which is great news. Overall the heel unfortunately had already closed previously when we saw him although it apparently reopened when he was working with physical therapy according to his wife. The area in the sacral region is doing well and looks clean there is still some depth here  but I still think it would be difficult to wound VAC this region. His wife does an awesome job taking care of him. Is been so long since we have seen  him because he has been in the hospital to be honest. 08/07/2021 upon evaluation today patient appears to be doing well at this time. Fortunately I do not see any signs of active infection locally or systemically at this time which is great news. No fevers, chills, nausea, vomiting, or diarrhea. Unfortunately after I saw him on the 24th he actually ended up in the hospital in the 27th due to being septic. This was not due to the wounds but after looking at records actually due to a UTI. Fortunately he is doing much better and very happy in that regard. I do not see any signs of infection locally nor systemically at this time. 08/21/2021 upon evaluation today patient appears to be doing well with regard to his wound. Fortunately I think that the sacral region is doing decently well at this point which is great news. With regard to the foot this also does have some slough noted but I feel like we are making progress here. He does have some irritation around the right upper thigh/gluteal region. I feel like it is more towards the thigh. Nonetheless this does appear to be pressure related he spends a lot of time sitting up his caregiver states he really will not get in the bed and stay there like he should. 09/04/2021 upon evaluation patient appears to be doing decently well today in regard to the wound on his heel as well as the sacral area. I am actually very pleased with both and how things are appearing currently. There does not appear to be any evidence of active infection I think that his caregiver is doing an awesome job with regard to the wound care here. She is present today as well and I did discuss this with her. 09/25/2021 upon evaluation today patient appears to be doing well with regard to his wound on the sacral region. His right heel is also doing well. Unfortunately he has a new area on his left heel which does appear to be pressure injury. I do not see any evidence of active infection locally or  systemically which is great news no fevers, chills, nausea, vomiting, or diarrhea. Readmission: 11-22-2021 upon evaluation today patient presents for reevaluation here in the clinic concerning issues with his wounds. Since have last seen him he was in the hospital and then subsequently ended up in a skilled nursing facility. Subsequently period of time in the facility he actually had a breakdown in the right thigh location which has been an issue for him. Fortunately I do not see any evidence of active infection locally or systemically which is great news and I am very pleased in that regard. Nonetheless he does have still the wounds on both heels as well as the wound in the sacral area and the new area in the right upper thigh/gluteal region. 12-13-2021 upon evaluation today patient appears to be doing well currently in regard to his wounds. The left heel is going to require some sharp debridement. Fortunately the right heel seems to be doing quite well. Overall I think his gluteal region as well as sacral region also are doing well. 01-10-2022 upon evaluation today patient appears to be doing better in regard to some areas unfortunately his sacral area is not doing as well. It seems of gotten worse his caregiver states  when he was in the hospital recently they really did not offloading. Fortunately I do not see any evidence of active infection locally or systemically at this time. 02-01-2022 upon evaluation today patient appears to be doing well currently in regard to his wounds. He is going require some sharp debridement of clearway some of the necrotic debris. Fortunately I do not see any evidence of active infection locally or systemically which is great news. No fevers, chills, nausea, vomiting, or diarrhea. 02-22-2022 upon evaluation today patient appears to be doing poorly in regard to his right heel the left heel is completely closed. Unfortunately I think he is got some deep tissue injury to  this area I am going to do a culture at this spot. With that being said he is having 2 other significant issues in regard to the left thigh this is showing signs of cellulitis all the way down into the thigh and was seeing definite temperature difference in the left thigh versus the right thigh when feeling. It also is of note that the patient is also having some issues here with trouble in regard to having fevers his wife has been giving him Tylenol and ibuprofen to help keep the fevers under control but he has been as high as 102 this is pretty much been going on since Monday or Tuesday of this week. Nonetheless I am concerned I am not exactly sure where the cellulitis on the left thigh is coming from not sure if this is emanating from his sacral region that is a possibility or if there is something completely different going on either way with the fevers I am more worried that he needs to go get this checked out as soon as possible. 03-08-2022 upon evaluation today patient appears to be doing well with regard to his heel as well as sacral region. Both are showing signs of significant improvement compared to the last time I saw him. Overall I think we are in a much better spot 04-04-2022 upon evaluation today patient appears to be doing about the same in regard to his wounds. I do not see any signs of improvement nor worsening at this point. Fortunately there is no evidence of active infection at this time. He is not staying off of this is much as he needs to in fact is not even sleeping in the hospital bed with the alternating air mattress. 04-16-2022 upon evaluation today patient appears to be doing well currently in regard to his wound. He has been tolerating the dressing changes without complication. Fortunately I do not see any evidence of infection. The heel actually looks quite well the sacral area actually is still quite deep. From my questioning and discussion with him today I do not believe  that he is really staying off of this as much as he should. He seemed a little uncomfortable during that portion of the conversation today. I think that is contributing quite a bit to his lack of progress with regard to healing. 11/7; patient with decubitus ulcers on his right heel as well as his lower sacrum. We have been using Hydrofera Blue. He has home health predominantly for Foley catheter management. There have been some issues getting the Hydrofera Blue through home health but apparently the patient's wife has some supplies and is managing to get these changed.They come every 3 weeks because of transportation difficulties 05-28-2022 upon evaluation today patient appears to be doing decently well currently in regard to his wounds. He still has a  depth to the wound in the sacral region but again this does seem to be a little bit less than previous. He has been using Hydrofera Blue both on this in the heel ulcer. Both are going to require some cleaning today sharp debridement on the heel I think is cleaning with saline gauze and the sterile Q-tip is probably sufficient for the sacral area which does not appear to have too much necrotic tissue. 06-18-2022 upon evaluation today patient's wounds are showing signs of doing decently well. Fortunately I do not see any signs of active infection locally nor systemically at this time which is great news. No fevers, chills, nausea, vomiting, or diarrhea. 07-15-2022 upon evaluation today patient appears to be doing well currently in regard to his wound. He has been tolerating the dressing changes without complication. Fortunately I think that the heel is doing well except for where it is causing pressure to the heel posteriorly and this apparently is due to some kind of exercise machine that his children got him. Nonetheless I am not sure that that is going to be helpful especially if it is causing discomfort and pain. And especially if it is making the wound  worse which is an even bigger deal. With regard to the patient's sacral region this is draining quite significantly. I do think a wound VAC would be ideal here and I would recommend that we go ahead and try to see about getting this ordered for him as soon as possible. The patient's wife and the patient are in agreement with this plan. 08-05-2022 upon evaluation today patient appears to be doing well currently in regard to both his heel and his sacral region. Unfortunately he is not eligible for a wound VAC as he is previously already undergone all the treatment that is allowable under Medicare guidelines for his sacral wound. This is quite unfortunate as I felt like that could have potentially help this speed this up but nonetheless it is where we stand. For that reason I think the [facility] is probably still the best way to continue at this point and I discussed that with the patient and his wife today. 08-26-2022 upon evaluation today patient appears to be doing well currently in regard to his wound. He on the heel of his looking much better I did have to perform some debridement here. In regard to the sacral area no debridement was necessary it looks to clean this has been although there was a lot of Hydrofera Blue and there much more than is probably necessary. HAYLEY, DESHAIES ([id number]) [id number] Page 3 of 11 09-15-2021 upon evaluation today patient appears to be doing well currently in regard to his heel ulcer which is looking okay although the gluteal region specifically the sacral wound is showing signs of some issues there. Fortunately I do not see any evidence of infection significantly to the heel although I am not so sure about the sacral region. I think he probably would benefit from initiation of antibiotics. 10-01-2022 upon evaluation today patient unfortunately had a hospital stay where he ended up having his catheter which had been apparently  blowing up into the urethra. With that being said he had traumatic bleeding and subsequently ended up in the hospital I did review that discharge summary as well. The good news is he is doing somewhat better at this point but he did end up laying on his backside a lot causing the wound on the sacral area to actually be worse today compared  to where we have been previous. This is not good and not the direction we want to go. Again the depth is what was actually more significant. 10-22-2022 upon evaluation today patient appears to be doing decently well in regard to his heel ulcer. I do not see any signs of active infection locally nor systemically at this time which is great news. No fevers, chills, nausea, vomiting, or diarrhea. 11-12-2022 upon evaluation today patient appears to be doing well currently with regard to his heel ulcer which is actually showing signs of excellent progress. Fortunately I do not see any signs of infection and I think that he is making good progress here. Fortunately I do not see any signs of infection with regard to the sacral area either. 12-03-2022 upon evaluation today patient appears to be doing well currently in regard to his wounds. The heel looks to be almost healed but it is still open. With that being said regular continue with the [facility] to both locations. Unfortunately regard to the gluteal region this is not doing quite as well. I discussed with the patient and his wife that we really need to try to see what can be done in order to make sure this is pectin well and stays in. We also had an incident where the patient's wife has a picture of where a plastic bag containing Hydrofera Blue inside of it was actually taped to the wound on his gluteal region followed by an ABD pad secured with tape honestly if I did not see the pictures I would not do believe that she tells me that she is actually going to head over to [facility] home office when she leaves here to  have a discussion with somebody about this as that is unacceptable I agree. 12-24-2022 upon evaluation today patient appears to be doing well currently in regard to his heel which is looking better the sacral area is not looking quite as good. It is not infected and is not worse but is also not a lot improved compared to last time I saw him. Again he still is not staying off of this asking today how he was doing with staying off of his gluteal region and offloading and he tells me not very good. 01-14-2023 upon evaluation today patient appears to be doing well currently in regard to his sacral wound which I feel like is making some headway towards closure little by little. Unfortunately his heel is actually significantly worse compared to last I saw him. Fortunately I do not see any signs of infection at this time. 02-04-23 evaluation today patient appears to be doing well with regards to the sacral wound was not doing quite as good. Fortunately I do not see any signs of infection at either site right now although we will keep a close eye on this heal as we move forward. 8/27; this is a patient we follow for a sacral wound on the right heel wound which were pressure ulcers and this man who is fairly disabled. According to his wife he does not offload this properly he is up sitting in his wheelchair quite a bit during the day. On the sacrum we are using Hydrofera Blue ABDs and on the heel Hydrofera Blue blue and gauze. In general the wounds I think are stable not changing too much recently. 04-01-2023 upon evaluation today patient appears to be doing well currently in regard to the sacral wound which is not worse but is also not better. It has  been about a month since have seen him as he was septic due to UTI and ended up in rehab following due to being weak. With that being said he does have a new wound on the left lower extremity which is secondary to having been placed in the wheelchair left they are too  long during the time he was in rehab according to his wife. 04-22-2023 upon evaluation today patient appears to be doing well currently in regard to his wounds which all actually appear to be doing a little bit better if not completely better with the ankle already being healed. With that being said he was in the hospital from October 11 through October 19 due to what is stated to be sepsis secondary to catheter associated UTI specifically Enterococcus is what was mentioned. With that being said he seems to be doing much better at this point. They do have a Hoyer at home which has been of great benefit and he is staying off of this more at home which I think has helped all of his wounds as well. Fortunately I do not see any signs of active infection locally or systemically at the moment which is great news and I do believe that he is doing well in that regard. 05-13-2023 upon evaluation today patient's wounds unfortunately do not appear to be doing quite as well as they were last week. I do not see any signs of infection and in fact has not been just a week but about 3 weeks since I last saw him. Fortunately I do not see any evidence of worsening as far as infection is concerned but I did see signs of deep tissue injury to the heel and some expansion of the outer rim of the wound on the sacral location. 06-03-2023 upon evaluation today patient unfortunately appears to be doing significantly worse with regard to his wounds compared to where he was previous. Fortunately I do not see any signs of active infection at this time which is good news but I do believe that the patient is making quite a bit of trouble for himself by sitting too long and putting pressure on the gluteal region. This actually appears to be doing significantly worse compared to what it was previous and I discussed that with him today as well. I think if he keeps doing this he is getting up with a wound that is 4 times the size of the  1 we have been dealing with at this point. I also advised him of this today in no uncertain terms. Electronic Signature(s) Signed: 06/03/2023 5:39:11 PM By: Allen Derry PA-C Entered By: Allen Derry on 06/03/2023 14:39:10 -------------------------------------------------------------------------------- Physical Exam Details Patient Name: Date of Service: [name] [facility] 06/03/2023 8:45 A M Medical Record Number: [id number] Patient Account Number: [id number] Date of Birth/Sex: Treating RN: 11/17/1955 (66 y.o. [name]) [name] Primary Care Provider: Dione Booze Other Clinician: Referring Provider: Treating Provider/Extender: [name] in Treatment: [address], [place] ([id number]) [id number] Page 4 of 11 Constitutional Well-nourished and well-hydrated in no acute distress. Respiratory normal breathing without difficulty. Psychiatric this patient is able to make decisions and demonstrates good insight into disease process. Alert and Oriented x 3. pleasant and cooperative. Notes I would recommend based on what we are seeing right now no debridement was needed the heel is looking better however the gluteal region does not look good at all and I feel like this is getting worse not better. I explained to the  patient this is due to pressure and he is sitting on the couch and being told by the individual with him today his wife could not come she had a fall unfortunately. Nonetheless he is not doing what he needs to do as far as getting off of the gluteal area and it shows and how bad his wound is doing right now. Electronic Signature(s) Signed: 06/03/2023 5:39:31 PM By: Allen Derry PA-C Entered By: Allen Derry on 06/03/2023 14:39:31 -------------------------------------------------------------------------------- Physician Orders Details Patient Name: Date of Service: [name] [facility] 06/03/2023 8:45 A M Medical  Record Number: [id number] Patient Account Number: [id number] Date of Birth/Sex: Treating RN: 22-Jul-1955 (66 y.o. [name]) [name] Primary Care Provider: Dione Booze Other Clinician: Referring Provider: Treating Provider/Extender: [name] in Treatment: 79 The following information was scribed by: [name] The information was scribed for: Allen Derry Verbal / Phone Orders: No Diagnosis Coding Follow-up Appointments Return Appointment in 3 weeks. Home Health [facility] for wound care. May utilize formulary equivalent dressing for wound treatment orders unless otherwise specified. Home Health Nurse may visit PRN to address patients wound care needs. Beverly Gust [phone] **Please direct any NON-WOUND related issues/requests for orders to patient's Primary Care Physician. **If current dressing causes regression in wound condition, may D/C ordered dressing product/s and apply Normal Saline Moist Dressing daily until next Wound Healing Center or Other MD appointment. **Notify Wound Healing Center of regression in wound condition at [phone]. Bathing/ Shower/ Hygiene No tub bath. Anesthetic (Use 'Patient Medications' Section for Anesthetic Order Entry) Lidocaine applied to wound bed Off-Loading Gel wheelchair cushion Low air-loss mattress (Group 2) - Sleep in bed every night. Turn and reposition every 2 hours - keep pressure off of the sacrum and heels wounds Other: - PRAFO boot in bed keep pressure off of sacrum/gluteus and heels- Additional Orders / Instructions Follow Nutritious Diet and Increase Protein Intake Wound Treatment Wound #5 - Calcaneus Wound Laterality: Right Cleanser: Normal Saline 3 x Per Week/30 Days Dirks, [name] ([id number]) [id number] Page 5 of 11 Discharge Instructions: Wash your hands with soap and water. Remove old dressing, discard into plastic bag and place into trash. Cleanse  the wound with Normal Saline prior to applying a clean dressing using gauze sponges, not tissues or cotton balls. Do not scrub or use excessive force. Pat dry using gauze sponges, not tissue or cotton balls. Cleanser: Soap and Water 3 x Per Week/30 Days Discharge Instructions: Gently cleanse wound with antibacterial soap, rinse and pat dry prior to dressing wounds Prim Dressing: Hydrofera Blue Ready Transfer Foam, 2.5x2.5 (in/in) ary 3 x Per Week/30 Days Discharge Instructions: cut to size of wound Secondary Dressing: Gauze 3 x Per Week/30 Days Discharge Instructions: As directed: dry, moistened with saline or moistened with Dakins Solution Secured With: Medipore T - 35M Medipore H Soft Cloth Surgical T ape ape, 2x2 (in/yd) 3 x Per Week/30 Days Wound #7 - Sacrum Cleanser: Normal Saline 1 x Per Day/30 Days Discharge Instructions: Wash your hands with soap and water. Remove old dressing, discard into plastic bag and place into trash. Cleanse the wound with Normal Saline prior to applying a clean dressing using gauze sponges, not tissues or cotton balls. Do not scrub or use excessive force. Pat dry using gauze sponges, not tissue or cotton balls. Cleanser: Soap and Water 1 x Per Day/30 Days Discharge Instructions: Gently cleanse wound with antibacterial soap, rinse and pat dry prior to dressing wounds Topical: calmoseptine 1 x Per Day/30  Days Discharge Instructions: apply to excoriated areas / periwound Prim Dressing: Hydrofera Blue Ready Transfer Foam, 4x5 (in/in) 1 x Per Day/30 Days ary Discharge Instructions: Apply Hydrofera Blue Ready to wound bed as directed Secondary Dressing: ABD Pad 5x9 (in/in) 1 x Per Day/30 Days Discharge Instructions: Cover with ABD pad Electronic Signature(s) Signed: 06/03/2023 3:39:14 PM By: [name] RN Signed: 06/05/2023 7:39:18 AM By: Allen Derry PA-C Entered By: [name] on 06/03/2023  12:39:13 -------------------------------------------------------------------------------- Problem List Details Patient Name: Date of Service: [name] [facility] 06/03/2023 8:45 A M Medical Record Number: [id number] Patient Account Number: [id number] Date of Birth/Sex: Treating RN: 1955/08/30 (66 y.o. [name] Primary Care Provider: Dione Booze Other Clinician: Referring Provider: Treating Provider/Extender: [name] in Treatment: 79 Active Problems ICD-10 Encounter Code Description Active Date MDM Diagnosis L89.154 Pressure ulcer of sacral region, stage 4 11/22/2021 No Yes L89.613 Pressure ulcer of right heel, stage 3 11/22/2021 No Yes Swailes, [name] [id number] Page 6 of 11 [phone] Non-pressure chronic ulcer of other part of left lower leg with fat layer exposed10/07/2022 No Yes M62.81 Muscle weakness (generalized) 11/22/2021 No Yes F31.9 Bipolar disorder, unspecified 11/22/2021 No Yes E11.622 Type 2 diabetes mellitus with other skin ulcer 11/22/2021 No Yes I10 Essential (primary) hypertension 11/22/2021 No Yes Z79.01 Long term (current) use of anticoagulants 11/22/2021 No Yes 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 11/22/2021 No Yes Inactive Problems ICD-10 Code Description Active Date Inactive Date L89.623 Pressure ulcer of left heel, stage 3 11/22/2021 11/22/2021 Resolved Problems ICD-10 Code Description Active Date Resolved Date L24.A0 Irritant contact dermatitis due to friction or contact with body fluids, unspecified 11/22/2021 11/22/2021 L98.412 Non-pressure chronic ulcer of buttock with fat layer exposed 11/22/2021 11/22/2021 Electronic Signature(s) Signed: 06/03/2023 5:38:56 PM By: Allen Derry PA-C Entered By: Allen Derry on 06/03/2023 14:38:56 -------------------------------------------------------------------------------- Progress Note Details Patient Name: Date of  Service: [name] [facility] 06/03/2023 8:45 A M Medical Record Number: [id number] Patient Account Number: [id number] Date of Birth/Sex: Treating RN: 12-24-1955 (66 y.o. [name]) [name] Primary Care Provider: Dione Booze Other Clinician: Referring Provider: Treating Provider/Extender: [name] in Treatment: [address] [place] ([id number]) [id number] Page 7 of 11 Subjective Chief Complaint Information obtained from Patient Sacral, right heel, and left leg ulcers History of Present Illness (HPI) 05/29/2021 this is a patient who presents today for initial inspection here in the clinic concerning wounds that he has over the right heel and the sacral region. Unfortunately the sacral wound is starting to spread off to the right gluteal location due to how he sits always leaning towards the right side in his chair. His wife is present she is the primary caregiver though she is not home with him all the time she does have to work. She does do an excellent job however it appears to be in trying to keep things under good control for him. The patient is not able to change positions himself nor walk by himself so he is pretty much at the mercy of the position he is putting when she is gone and this tends to be his chair which she sits and most of the day. Obviously this I think is the main culprit for what is going on currently. It was actually in January 2020 when the sacral wound started. It was in September 2022 when the wound started to spread more to the right gluteal location. Subsequently in August 2022 is when he had been in a skilled nursing  facility and the heel started to give him trouble as well. That does not seem to be doing nearly as poorly as the sacral region. He was hospitalized in October 2022 secondary to sepsis and this was in regard to the foot and was sent to skilled nursing again he is now back at home. He did  have a wound VAC for the sacral wound over the summer 2022 but being in and out of facilities this ended up getting sent back. The patient does have [facility] home health that comes out 1 time per week to help with care. His most recent hemoglobin A1c was 6.9 in August 2022. 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 s. 07/24/2021 upon evaluation today patient appears to be doing decently well in regard to the pressure ulcer on the right heel as well as the sacral region. In general I think you are making some progress here which is great news. Overall the heel unfortunately had already closed previously when we saw him although it apparently reopened when he was working with physical therapy according to his wife. The area in the sacral region is doing well and looks clean there is still some depth here but I still think it would be difficult to wound VAC this region. His wife does an awesome job taking care of him. Is been so long since we have seen him because he has been in the hospital to be honest. 08/07/2021 upon evaluation today patient appears to be doing well at this time. Fortunately I do not see any signs of active infection locally or systemically at this time which is great news. No fevers, chills, nausea, vomiting, or diarrhea. Unfortunately after I saw him on the 24th he actually ended up in the hospital in the 27th due to being septic. This was not due to the wounds but after looking at records actually due to a UTI. Fortunately he is doing much better and very happy in that regard. I do not see any signs of infection locally nor systemically at this time. 08/21/2021 upon evaluation today patient appears to be doing well with regard to his wound. Fortunately I think that the sacral  region is doing decently well at this point which is great news. With regard to the foot this also does have some slough noted but I feel like we are making progress here. He does have some irritation around the right upper thigh/gluteal region. I feel like it is more towards the thigh. Nonetheless this does appear to be pressure related he spends a lot of time sitting up his caregiver states he really will not get in the bed and stay there like he should. 09/04/2021 upon evaluation patient appears to be doing decently well today in regard to the wound on his heel as well as the sacral area. I am actually very pleased with both and how things are appearing currently. There does not appear to be any evidence of active infection I think that his caregiver is doing an awesome job with regard to the wound care here. She is present today as well and I did discuss this with her. 09/25/2021 upon evaluation today patient appears to be doing well with regard to his wound on the sacral region. His right heel is also doing well. Unfortunately he has a new area on  his left heel which does appear to be pressure injury. I do not see any evidence of active infection locally or systemically which is great news no fevers, chills, nausea, vomiting, or diarrhea. Readmission: 11-22-2021 upon evaluation today patient presents for reevaluation here in the clinic concerning issues with his wounds. Since have last seen him he was in the hospital and then subsequently ended up in a skilled nursing facility. Subsequently period of time in the facility he actually had a breakdown in the right thigh location which has been an issue for him. Fortunately I do not see any evidence of active infection locally or systemically which is great news and I am very pleased in that regard. Nonetheless he does have still the wounds on both heels as well as the wound in the sacral area and the new area in the right upper thigh/gluteal  region. 12-13-2021 upon evaluation today patient appears to be doing well currently in regard to his wounds. The left heel is going to require some sharp debridement. Fortunately the right heel seems to be doing quite well. Overall I think his gluteal region as well as sacral region also are doing well. 01-10-2022 upon evaluation today patient appears to be doing better in regard to some areas unfortunately his sacral area is not doing as well. It seems of gotten worse his caregiver states when he was in the hospital recently they really did not offloading. Fortunately I do not see any evidence of active infection locally or systemically at this time. 02-01-2022 upon evaluation today patient appears to be doing well currently in regard to his wounds. He is going require some sharp debridement of clearway some of the necrotic debris. Fortunately I do not see any evidence of active infection locally or systemically which is great news. No fevers, chills, nausea, vomiting, or diarrhea. 02-22-2022 upon evaluation today patient appears to be doing poorly in regard to his right heel the left heel is completely closed. Unfortunately I think he is got some deep tissue injury to this area I am going to do a culture at this spot. With that being said he is having 2 other significant issues in regard to the left thigh this is showing signs of cellulitis all the way down into the thigh and was seeing definite temperature difference in the left thigh versus the right thigh when feeling. It also is of note that the patient is also having some issues here with trouble in regard to having fevers his wife has been giving him Tylenol and ibuprofen to help keep the fevers under control but he has been as high as 102 this is pretty much been going on since Monday or Tuesday of this week. Nonetheless I am concerned I am not exactly sure where the cellulitis on the left thigh is coming from not sure if this is emanating from his  sacral region that is a possibility or if there is something completely different going on either way with the fevers I am more worried that he needs to go get this checked out as soon as possible. 03-08-2022 upon evaluation today patient appears to be doing well with regard to his heel as well as sacral region. Both are showing signs of significant improvement compared to the last time I saw him. Overall I think we are in a much better spot 04-04-2022 upon evaluation today patient appears to be doing about the same in regard to his wounds. I do not see any signs of improvement  nor worsening at this point. Fortunately there is no evidence of active infection at this time. He is not staying off of this is much as he needs to in fact is not even sleeping in the hospital bed with the alternating air mattress. 04-16-2022 upon evaluation today patient appears to be doing well currently in regard to his wound. He has been tolerating the dressing changes without complication. Fortunately I do not see any evidence of infection. The heel actually looks quite well the sacral area actually is still quite deep. From my questioning and discussion with him today I do not believe that he is really staying off of this as much as he should. He seemed a little uncomfortable during that portion of the conversation today. I think that is contributing quite a bit to his lack of progress with regard to healing. 11/7; patient with decubitus ulcers on his right heel as well as his lower sacrum. We have been using Hydrofera Blue. He has home health predominantly for Foley catheter management. There have been some issues getting the Hydrofera Blue through home health but apparently the patient's wife has some [employer] ([id number]) [id number] Page 8 of 11 supplies and is managing to get these changed.They come every 3 weeks because of transportation difficulties 05-28-2022 upon evaluation  today patient appears to be doing decently well currently in regard to his wounds. He still has a depth to the wound in the sacral region but again this does seem to be a little bit less than previous. He has been using Hydrofera Blue both on this in the heel ulcer. Both are going to require some cleaning today sharp debridement on the heel I think is cleaning with saline gauze and the sterile Q-tip is probably sufficient for the sacral area which does not appear to have too much necrotic tissue. 06-18-2022 upon evaluation today patient's wounds are showing signs of doing decently well. Fortunately I do not see any signs of active infection locally nor systemically at this time which is great news. No fevers, chills, nausea, vomiting, or diarrhea. 07-15-2022 upon evaluation today patient appears to be doing well currently in regard to his wound. He has been tolerating the dressing changes without complication. Fortunately I think that the heel is doing well except for where it is causing pressure to the heel posteriorly and this apparently is due to some kind of exercise machine that his children got him. Nonetheless I am not sure that that is going to be helpful especially if it is causing discomfort and pain. And especially if it is making the wound worse which is an even bigger deal. With regard to the patient's sacral region this is draining quite significantly. I do think a wound VAC would be ideal here and I would recommend that we go ahead and try to see about getting this ordered for him as soon as possible. The patient's wife and the patient are in agreement with this plan. 08-05-2022 upon evaluation today patient appears to be doing well currently in regard to both his heel and his sacral region. Unfortunately he is not eligible for a wound VAC as he is previously already undergone all the treatment that is allowable under Medicare guidelines for his sacral wound. This is quite unfortunate as I  felt like that could have potentially help this speed this up but nonetheless it is where we stand. For that reason I think the [facility] is probably still the best way to continue at this  point and I discussed that with the patient and his wife today. 08-26-2022 upon evaluation today patient appears to be doing well currently in regard to his wound. He on the heel of his looking much better I did have to perform some debridement here. In regard to the sacral area no debridement was necessary it looks to clean this has been although there was a lot of Hydrofera Blue and there much more than is probably necessary. 09-15-2021 upon evaluation today patient appears to be doing well currently in regard to his heel ulcer which is looking okay although the gluteal region specifically the sacral wound is showing signs of some issues there. Fortunately I do not see any evidence of infection significantly to the heel although I am not so sure about the sacral region. I think he probably would benefit from initiation of antibiotics. 10-01-2022 upon evaluation today patient unfortunately had a hospital stay where he ended up having his catheter which had been apparently blowing up into the urethra. With that being said he had traumatic bleeding and subsequently ended up in the hospital I did review that discharge summary as well. The good news is he is doing somewhat better at this point but he did end up laying on his backside a lot causing the wound on the sacral area to actually be worse today compared to where we have been previous. This is not good and not the direction we want to go. Again the depth is what was actually more significant. 10-22-2022 upon evaluation today patient appears to be doing decently well in regard to his heel ulcer. I do not see any signs of active infection locally nor systemically at this time which is great news. No fevers, chills, nausea, vomiting, or diarrhea. 11-12-2022 upon  evaluation today patient appears to be doing well currently with regard to his heel ulcer which is actually showing signs of excellent progress. Fortunately I do not see any signs of infection and I think that he is making good progress here. Fortunately I do not see any signs of infection with regard to the sacral area either. 12-03-2022 upon evaluation today patient appears to be doing well currently in regard to his wounds. The heel looks to be almost healed but it is still open. With that being said regular continue with the [facility] to both locations. Unfortunately regard to the gluteal region this is not doing quite as well. I discussed with the patient and his wife that we really need to try to see what can be done in order to make sure this is pectin well and stays in. We also had an incident where the patient's wife has a picture of where a plastic bag containing Hydrofera Blue inside of it was actually taped to the wound on his gluteal region followed by an ABD pad secured with tape honestly if I did not see the pictures I would not do believe that she tells me that she is actually going to head over to [facility] home office when she leaves here to have a discussion with somebody about this as that is unacceptable I agree. 12-24-2022 upon evaluation today patient appears to be doing well currently in regard to his heel which is looking better the sacral area is not looking quite as good. It is not infected and is not worse but is also not a lot improved compared to last time I saw him. Again he still is not staying off of this asking today how  he was doing with staying off of his gluteal region and offloading and he tells me not very good. 01-14-2023 upon evaluation today patient appears to be doing well currently in regard to his sacral wound which I feel like is making some headway towards closure little by little. Unfortunately his heel is actually significantly worse compared to last I  saw him. Fortunately I do not see any signs of infection at this time. 02-04-23 evaluation today patient appears to be doing well with regards to the sacral wound was not doing quite as good. Fortunately I do not see any signs of infection at either site right now although we will keep a close eye on this heal as we move forward. 8/27; this is a patient we follow for a sacral wound on the right heel wound which were pressure ulcers and this man who is fairly disabled. According to his wife he does not offload this properly he is up sitting in his wheelchair quite a bit during the day. On the sacrum we are using Hydrofera Blue ABDs and on the heel Hydrofera Blue blue and gauze. In general the wounds I think are stable not changing too much recently. 04-01-2023 upon evaluation today patient appears to be doing well currently in regard to the sacral wound which is not worse but is also not better. It has been about a month since have seen him as he was septic due to UTI and ended up in rehab following due to being weak. With that being said he does have a new wound on the left lower extremity which is secondary to having been placed in the wheelchair left they are too long during the time he was in rehab according to his wife. 04-22-2023 upon evaluation today patient appears to be doing well currently in regard to his wounds which all actually appear to be doing a little bit better if not completely better with the ankle already being healed. With that being said he was in the hospital from October 11 through October 19 due to what is stated to be sepsis secondary to catheter associated UTI specifically Enterococcus is what was mentioned. With that being said he seems to be doing much better at this point. They do have a Hoyer at home which has been of great benefit and he is staying off of this more at home which I think has helped all of his wounds as well. Fortunately I do not see any signs of active  infection locally or systemically at the moment which is great news and I do believe that he is doing well in that regard. 05-13-2023 upon evaluation today patient's wounds unfortunately do not appear to be doing quite as well as they were last week. I do not see any signs of infection and in fact has not been just a week but about 3 weeks since I last saw him. Fortunately I do not see any evidence of worsening as far as infection is concerned but I did see signs of deep tissue injury to the heel and some expansion of the outer rim of the wound on the sacral location. 06-03-2023 upon evaluation today patient unfortunately appears to be doing significantly worse with regard to his wounds compared to where he was previous. Fortunately I do not see any signs of active infection at this time which is good news but I do believe that the patient is making quite a bit of trouble for himself by sitting too long and  putting pressure on the gluteal region. This actually appears to be doing significantly worse compared to what it was previous and I discussed that with him today as well. I think if he keeps doing this he is getting up with a wound that is 4 times the size of the 1 we have been dealing with at this point. I also advised him of this today in no uncertain terms. LEELYNN, OTERO ([id number]) [id number] Page 9 of 11 Objective Constitutional Well-nourished and well-hydrated in no acute distress. Vitals Time Taken: 9:04 AM, Height: 66 in, Weight: 279 lbs, BMI: 45, Temperature: 97.7 F, Pulse: 78 bpm, Respiratory Rate: 18 breaths/min, Blood Pressure: 120/52 mmHg. Respiratory normal breathing without difficulty. Psychiatric this patient is able to make decisions and demonstrates good insight into disease process. Alert and Oriented x 3. pleasant and cooperative. General Notes: I would recommend based on what we are seeing right now no debridement was needed the heel  is looking better however the gluteal region does not look good at all and I feel like this is getting worse not better. I explained to the patient this is due to pressure and he is sitting on the couch and being told by the individual with him today his wife could not come she had a fall unfortunately. Nonetheless he is not doing what he needs to do as far as getting off of the gluteal area and it shows and how bad his wound is doing right now. Integumentary (Hair, Skin) Wound #5 status is Open. Original cause of wound was Pressure Injury. The date acquired was: 07/01/2021. The wound has been in treatment 79 weeks. The wound is located on the Right Calcaneus. The wound measures 2.5cm length x 2cm width x 0.3cm depth; 3.927cm^2 area and 1.178cm^3 volume. There is Fat Layer (Subcutaneous Tissue) exposed. There is no tunneling or undermining noted. There is a medium amount of serosanguineous drainage noted. There is small (1-33%) red granulation within the wound bed. There is a large (67-100%) amount of necrotic tissue within the wound bed including Adherent Slough. Wound #7 status is Open. Original cause of wound was Pressure Injury. The date acquired was: 07/01/2021. The wound has been in treatment 79 weeks. The wound is located on the Sacrum. The wound measures 2.5cm length x 1.5cm width x 4.3cm depth; 2.945cm^2 area and 12.665cm^3 volume. There is Fat Layer (Subcutaneous Tissue) exposed. There is no tunneling or undermining noted. There is a medium amount of serosanguineous drainage noted. There is large (67- 100%) red granulation within the wound bed. There is no necrotic tissue within the wound bed. Assessment Active Problems ICD-10 Pressure ulcer of sacral region, stage 4 Pressure ulcer of right heel, stage 3 Non-pressure chronic ulcer of other part of left lower leg with fat layer exposed Muscle weakness (generalized) Bipolar disorder, unspecified Type 2 diabetes mellitus with other skin  ulcer Essential (primary) hypertension Long term (current) use of anticoagulants Personal history of traumatic brain injury Plan Follow-up Appointments: Return Appointment in 3 weeks. Home Health: [facility] for wound care. May utilize formulary equivalent dressing for wound treatment orders unless otherwise specified. Home Health Nurse may visit [facility] to address patients wound care needs. Beverly Gust [phone] **Please direct any NON-WOUND related issues/requests for orders to patient's Primary Care Physician. **If current dressing causes regression in wound condition, may D/C ordered dressing product/s and apply Normal Saline Moist Dressing daily until next Wound Healing Center or Other MD appointment. **Notify Wound Healing Center of regression in wound  condition at [phone]. Bathing/ Shower/ Hygiene: No tub bath. Anesthetic (Use 'Patient Medications' Section for Anesthetic Order Entry): Lidocaine applied to wound bed Off-Loading: Gel wheelchair cushion Low air-loss mattress (Group 2) - Sleep in bed every night. Turn and reposition every 2 hours - keep pressure off of the sacrum and heels wounds Other: - PRAFO boot in bed keep pressure off of sacrum/gluteus and heels- Additional Orders / Instructions: Follow Nutritious Diet and Increase Protein Intake WOUND #5: - Calcaneus Wound Laterality: Right Cleanser: Normal Saline 3 x Per Week/30 Days Discharge Instructions: Wash your hands with soap and water. Remove old dressing, discard into plastic bag and place into trash. Cleanse the wound with Normal Saline prior to applying a clean dressing using gauze sponges, not tissues or cotton balls. Do not scrub or use excessive force. Pat dry using gauze sponges, not tissue or cotton balls. Cleanser: Soap and Water 3 x Per Week/30 Days Discharge Instructions: Gently cleanse wound with antibacterial soap, rinse and pat dry prior to dressing wounds Prim Dressing: Hydrofera Blue  Ready Transfer Foam, 2.5x2.5 (in/in) 3 x Per Week/30 Days ary Discharge Instructions: cut to size of wound Secondary Dressing: Gauze 3 x Per Week/30 Days [place], [name] ([id number]) [id number] Page 10 of 11 Discharge Instructions: As directed: dry, moistened with saline or moistened with Dakins Solution Secured With: Medipore T - 54M Medipore H Soft Cloth Surgical T ape ape, 2x2 (in/yd) 3 x Per Week/30 Days WOUND #7: - Sacrum Wound Laterality: Cleanser: Normal Saline 1 x Per Day/30 Days Discharge Instructions: Wash your hands with soap and water. Remove old dressing, discard into plastic bag and place into trash. Cleanse the wound with Normal Saline prior to applying a clean dressing using gauze sponges, not tissues or cotton balls. Do not scrub or use excessive force. Pat dry using gauze sponges, not tissue or cotton balls. Cleanser: Soap and Water 1 x Per Day/30 Days Discharge Instructions: Gently cleanse wound with antibacterial soap, rinse and pat dry prior to dressing wounds Topical: calmoseptine 1 x Per Day/30 Days Discharge Instructions: apply to excoriated areas / periwound Prim Dressing: Hydrofera Blue Ready Transfer Foam, 4x5 (in/in) 1 x Per Day/30 Days ary Discharge Instructions: Apply Hydrofera Blue Ready to wound bed as directed Secondary Dressing: ABD Pad 5x9 (in/in) 1 x Per Day/30 Days Discharge Instructions: Cover with ABD pad 1. I would recommend based on what we are seeing that the patient needs to aggressively offload. If he does not he is can end up with this wound worsening significantly. 2 I am going to recommend as well that the patient should continue to monitor for any signs of infection or worsening. If anything changes he should contact the office and let me know. We will see patient back for reevaluation in 2 weeks here in the clinic. If anything worsens or changes patient will contact our office for  additional recommendations. Electronic Signature(s) Signed: 06/03/2023 5:39:39 PM By: Allen Derry PA-C Entered By: Allen Derry on 06/03/2023 14:39:39 -------------------------------------------------------------------------------- SuperBill Details Patient Name: Date of Service: [name] [facility] 06/03/2023 Medical Record Number: [id number] Patient Account Number: [id number] Date of Birth/Sex: Treating RN: April 10, 1956 (66 y.o. [name]) [name] Primary Care Provider: Dione Booze Other Clinician: Referring Provider: Treating Provider/Extender: [name] in Treatment: 79 Diagnosis Coding ICD-10 Codes Code Description L89.154 Pressure ulcer of sacral region, stage 4 L89.613 Pressure ulcer of right heel, stage 3 L97.822 Non-pressure chronic ulcer of other part of left lower leg with fat layer exposed  M62.81 Muscle weakness (generalized) F31.9 Bipolar disorder, unspecified E11.622 Type 2 diabetes mellitus with other skin ulcer I10 Essential (primary) hypertension Z79.01 Long term (current) use of anticoagulants 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 Facility Procedures : CPT4 Code: [id number] Description: 99213 - WOUND CARE VISIT-LEV 3 EST PT Modifier: Quantity: 1 Physician Procedures : CPT4 Code Description Modifier [id number] 99213 - WC PHYS LEVEL 3 - EST PT [place], [name] ([id number]) [id number] P Quantity: 1 age 82 of 89 : ICD-10 Diagnosis Description L89.154 Pressure ulcer of sacral region, stage 4 L89.613 Pressure ulcer of right heel, stage 3 L97.822 Non-pressure chronic ulcer of other part of left lower leg with fat layer exposed M62.81 Muscle weakness (generalized) Quantity: Electronic Signature(s) Signed: 06/03/2023 5:39:57 PM By: Allen Derry PA-C Previous Signature: 06/03/2023 3:39:59 PM Version By: [name] RN Entered By: Allen Derry on 06/03/2023 14:39:56

## 2023-06-24 ENCOUNTER — Encounter: Payer: Medicare Other | Admitting: Physician Assistant

## 2023-06-24 DIAGNOSIS — E11622 Type 2 diabetes mellitus with other skin ulcer: Secondary | ICD-10-CM | POA: Diagnosis not present

## 2023-06-24 NOTE — Progress Notes (Addendum)
[name], [name] ([id number]) [id number] Page 1 of 9 Visit Report for 06/24/2023 Arrival Information Details Patient Name: Date of Service: [name] [facility] 06/24/2023 8:45 A M Medical Record Number: [id number] Patient Account Number: [id number] Date of Birth/Sex: Treating RN: June 22, 1956 (67 y.o. [name]) [name] Primary Care [name]: Dione Booze Other Clinician: Referring Lillyrose Reitan: Treating [name]/Extender: [name] in Treatment: 84 Visit Information History Since Last Visit Added or deleted any medications: No Patient Arrived: Wheel Chair Any new allergies or adverse reactions: No Arrival Time: 08:50 Had a fall or experienced change in No Accompanied By: wife activities of daily living that may affect Transfer Assistance: None risk of falls: Patient Identification Verified: Yes Signs or symptoms of abuse/neglect since last visito No Secondary Verification Process Completed: Yes Hospitalized since last visit: No Patient Requires Transmission-Based Precautions: No Implantable device outside of the clinic excluding No Patient Has Alerts: Yes cellular tissue based products placed in the center Patient Alerts: Patient on Blood Thinner since last visit: Has Dressing in Place as Prescribed: Yes Pain Present Now: No Electronic Signature(s) Signed: 06/30/2023 3:37:08 PM By: [name] RN Entered By: [name] on 06/24/2023 06:03:51 -------------------------------------------------------------------------------- Clinic Level of Care Assessment Details Patient Name: Date of Service: [name] [facility] 06/24/2023 8:45 A M Medical Record Number: [id number] Patient Account Number: [id number] Date of Birth/Sex: Treating RN: 02-25-1956 (67 y.o. [name]) [name] Primary Care Strummer Canipe: Dione Booze Other Clinician: Referring Andranik Jeune: Treating [name]/Extender: [name] in  Treatment: 82 Clinic Level of Care Assessment Items TOOL 1 Quantity Score []  - 0 Use when EandM and Procedure is performed on INITIAL visit ASSESSMENTS - Nursing Assessment / Reassessment []  - 0 General Physical Exam (combine w/ comprehensive assessment (listed just below) when performed on new pt. evals) []  - 0 Comprehensive Assessment (HX, ROS, Risk Assessments, Wounds Hx, etc.) [name], [name] ([id number]) [phone].pdf Page 2 of 9 ASSESSMENTS - Wound and Skin Assessment / Reassessment []  - 0 Dermatologic / Skin Assessment (not related to wound area) ASSESSMENTS - Ostomy and/or Continence Assessment and Care []  - 0 Incontinence Assessment and Management []  - 0 Ostomy Care Assessment and Management (repouching, etc.) PROCESS - Coordination of Care []  - 0 Simple Patient / Family Education for ongoing care []  - 0 Complex (extensive) Patient / Family Education for ongoing care []  - 0 Staff obtains Chiropractor, Records, T Results / Process Orders est []  - 0 Staff telephones HHA, Nursing Homes / Clarify orders / etc []  - 0 Routine Transfer to another Facility (non-emergent condition) []  - 0 Routine Hospital Admission (non-emergent condition) []  - 0 New Admissions / Manufacturing engineer / Ordering NPWT Apligraf, etc. , []  - 0 Emergency Hospital Admission (emergent condition) PROCESS - Special Needs []  - 0 Pediatric / Minor Patient Management []  - 0 Isolation Patient Management []  - 0 Hearing / Language / Visual special needs []  - 0 Assessment of Community assistance (transportation, D/C planning, etc.) []  - 0 Additional assistance / Altered mentation []  - 0 Support Surface(s) Assessment (bed, cushion, seat, etc.) INTERVENTIONS - Miscellaneous []  - 0 External ear exam []  - 0 Patient Transfer (multiple staff / Nurse, adult / Similar devices) []  - 0 Simple Staple / Suture removal (25 or less) []  - 0 Complex Staple / Suture removal (26  or more) []  - 0 Hypo/Hyperglycemic Management (do not check if billed separately) []  - 0 Ankle / Brachial Index (ABI) - do not check if billed separately Has the patient been seen  at the hospital within the last three years: Yes Total Score: 0 Level Of Care: ____ Electronic Signature(s) Signed: 06/30/2023 3:37:08 PM By: [name] RN Entered By: [name] on 06/24/2023 06:45:14 -------------------------------------------------------------------------------- Encounter Discharge Information Details Patient Name: Date of Service: [name] [facility] 06/24/2023 8:45 A M Medical Record Number: [id number] Patient Account Number: [id number] Date of Birth/Sex: Treating RN: 1955/12/09 (67 y.o. [name] Primary Care [name]: Dione Booze Other Clinician: Referring [name]: Treating [name]/Extender: [name] in Treatment: [address] [name] ([id number]) [id number] Page 3 of 9 Encounter Discharge Information Items Discharge Condition: Stable Ambulatory Status: Wheelchair Discharge Destination: Home Transportation: Private Auto Accompanied By: self Schedule Follow-up Appointment: Yes Clinical Summary of Care: Electronic Signature(s) Signed: 06/30/2023 3:37:08 PM By: [name] RN Entered By: [name] on 06/24/2023 06:43:53 -------------------------------------------------------------------------------- Lower Extremity Assessment Details Patient Name: Date of Service: [name] [facility] 06/24/2023 8:45 A M Medical Record Number: [id number] Patient Account Number: [id number] Date of Birth/Sex: Treating RN: 1956-01-02 (67 y.o. [name]) [name] Primary Care [name]: Dione Booze Other Clinician: Referring [name]: Treating [name]/Extender: [name] in Treatment: 78 Electronic Signature(s) Signed: 06/30/2023 3:37:08 PM By: [name] RN Entered By: [name] on 06/24/2023 06:06:34 -------------------------------------------------------------------------------- Multi Wound Chart Details Patient Name: Date of Service: [name] [facility] 06/24/2023 8:45 A M Medical Record Number: [id number] Patient Account Number: [id number] Date of Birth/Sex: Treating RN: 1956-06-21 (67 y.o. [name] Primary Care Silvia Markuson: Dione Booze Other Clinician: Referring Meenakshi Sazama: Treating Aliani Caccavale/Extender: [name] in Treatment: 82 Vital Signs Height(in): 66 Pulse(bpm): 88 Weight(lbs): 279 Blood Pressure(mmHg): 156/78 Body Mass Index(BMI): 45 Temperature(F): 98.2 Respiratory Rate(breaths/min): 18 [5:Photos:] Crisanti, Vontae ([id number]) [5:Photos:] [N/[id number] Page 4 of 9 N/A] Right Calcaneus Sacrum N/A Wound Location: Pressure Injury Pressure Injury N/A Wounding Event: Pressure Ulcer Pressure Ulcer N/A Primary Etiology: Anemia, Sleep Apnea, Coronary Artery Anemia, Sleep Apnea, Coronary Artery N/A Comorbid History: Disease, Deep Vein Thrombosis, Disease, Deep Vein Thrombosis, Hypertension, Type II Diabetes, Hypertension, Type II Diabetes, History of pressure wounds, History of pressure wounds, Neuropathy Neuropathy 07/01/2021 07/01/2021 N/A Date Acquired: 98 82 N/A Weeks of Treatment: Open Open N/A Wound Status: No No N/A Wound Recurrence: 3x2.5x0.4 2.5x2.5x5 N/A Measurements L x W x D (cm) 5.89 4.909 N/A A (cm) : rea 2.356 24.544 N/A Volume (cm) : -42.30% -56.20% N/A % Reduction in A rea: -469.10% -160.40% N/A % Reduction in Volume: Category/Stage III Category/Stage IV N/A Classification: Medium Medium N/A Exudate A mount: Serosanguineous Serosanguineous N/A Exudate Type: red, brown red, brown N/A Exudate Color: Small (1-33%) Large (67-100%) N/A Granulation A mount: Red Red N/A Granulation Quality: Large (67-100%) None Present (0%) N/A Necrotic A  mount: Fat Layer (Subcutaneous Tissue): Yes Fat Layer (Subcutaneous Tissue): Yes N/A Exposed Structures: Fascia: No Fascia: No Tendon: No Tendon: No Muscle: No Muscle: No Joint: No Joint: No Bone: No Bone: No Large (67-100%) None N/A Epithelialization: Treatment Notes Electronic Signature(s) Signed: 06/30/2023 3:37:08 PM By: [name] RN Entered By: [name] on 06/24/2023 06:06:41 -------------------------------------------------------------------------------- Multi-Disciplinary Care Plan Details Patient Name: Date of Service: [name] Sickles [facility] 06/24/2023 8:45 A M Medical Record Number: [id number] Patient Account Number: [id number] Date of Birth/Sex: Treating RN: 09-Oct-1955 (67 y.o. [name] Primary Care Jani Moronta: Dione Booze Other Clinician: Referring Nhyira Leano: Treating Shirely Toren/Extender: [name] in Treatment: 82 Active Inactive Wound/Skin Impairment Nursing Diagnoses: Knowledge deficit related to ulceration/compromised skin integrity Zecca, [name] ([id number]) [phone].pdf Page 5 of  9 Goals: Patient/caregiver will verbalize understanding of skin care regimen Date Initiated: 11/22/2021 Target Resolution Date: 07/26/2023 Goal Status: Active Ulcer/skin breakdown will have a volume reduction of 30% by week 4 Date Initiated: 11/22/2021 Date Inactivated: 01/10/2022 Target Resolution Date: 12/23/2021 Goal Status: Unmet Unmet Reason: comorbities Ulcer/skin breakdown will have a volume reduction of 50% by week 8 Date Initiated: 11/22/2021 Date Inactivated: 05/07/2022 Target Resolution Date: 01/22/2022 Goal Status: Unmet Unmet Reason: comorbidities Ulcer/skin breakdown will have a volume reduction of 80% by week 12 Date Initiated: 11/22/2021 Date Inactivated: 05/07/2022 Target Resolution Date: 02/22/2022 Goal Status: Unmet Unmet Reason: comorbidities Ulcer/skin breakdown will heal within 14  weeks Date Initiated: 11/22/2021 Date Inactivated: 05/07/2022 Target Resolution Date: 03/25/2022 Goal Status: Unmet Unmet Reason: comorbidities Interventions: Assess patient/caregiver ability to obtain necessary supplies Assess patient/caregiver ability to perform ulcer/skin care regimen upon admission and as needed Assess ulceration(s) every visit Notes: Electronic Signature(s) Signed: 06/30/2023 3:37:08 PM By: [name] RN Entered By: [name] on 06/24/2023 06:06:58 -------------------------------------------------------------------------------- Pain Assessment Details Patient Name: Date of Service: [name] [facility] 06/24/2023 8:45 A M Medical Record Number: [id number] Patient Account Number: [id number] Date of Birth/Sex: Treating RN: 09-Jul-1955 (67 y.o. [name] Primary Care Ermon Sagan: Dione Booze Other Clinician: Referring Zariah Cavendish: Treating Ayansh Feutz/Extender: [name] in Treatment: 82 Active Problems Location of Pain Severity and Description of Pain Patient Has Paino No Site Locations Pain Management and Medication KJON, GERMANI ([id number]) [id number] Page 6 of 9 Current Pain Management: Electronic Signature(s) Signed: 06/30/2023 3:37:08 PM By: [name] RN Entered By: [name] on 06/24/2023 06:04:24 -------------------------------------------------------------------------------- Patient/Caregiver Education Details Patient Name: Date of Service: [name] [facility] 12/24/2024andnbsp8:45 A M Medical Record Number: [id number] Patient Account Number: [id number] Date of Birth/Gender: Treating RN: 01/27/1956 (67 y.o. [name]) [name] Primary Care Physician: Dione Booze Other Clinician: Referring Physician: Treating Physician/Extender: [name] in Treatment: 58 Education Assessment Education Provided To: Patient Education Topics  Provided Wound/Skin Impairment: Handouts: Caring for Your Ulcer Methods: Explain/Verbal Responses: State content correctly Electronic Signature(s) Signed: 06/30/2023 3:37:08 PM By: [name] RN Entered By: [name] on 06/24/2023 06:07:11 -------------------------------------------------------------------------------- Wound Assessment Details Patient Name: Date of Service: [name] [facility] 06/24/2023 8:45 A M Medical Record Number: [id number] Patient Account Number: [id number] Date of Birth/Sex: Treating RN: 10/08/1955 (67 y.o. [name] Primary Care Taylyn Brame: Dione Booze Other Clinician: Referring Aitana Burry: Treating Shakeila Pfarr/Extender: [name] in Treatment: 82 Wound Status Wound Number: 5 Primary Pressure Ulcer Etiology: Wound Location: Right Calcaneus Wound Open Hilyard, [name] ([id number]) [phone].pdf Page 7 of 9 Wound Open Wounding Event: Pressure Injury Status: Date Acquired: 07/01/2021 Comorbid Anemia, Sleep Apnea, Coronary Artery Disease, Deep Vein Weeks Of Treatment: 82 History: Thrombosis, Hypertension, Type II Diabetes, History of pressure Clustered Wound: No wounds, Neuropathy Photos Wound Measurements Length: (cm) 3 Width: (cm) 2.5 Depth: (cm) 0.4 Area: (cm) 5.89 Volume: (cm) 2.356 % Reduction in Area: -42.3% % Reduction in Volume: -469.1% Epithelialization: Large (67-100%) Tunneling: No Undermining: No Wound Description Classification: Category/Stage III Exudate Amount: Medium Exudate Type: Serosanguineous Exudate Color: red, brown Foul Odor After Cleansing: No Slough/Fibrino Yes Wound Bed Granulation Amount: Small (1-33%) Exposed Structure Granulation Quality: Red Fascia Exposed: No Necrotic Amount: Large (67-100%) Fat Layer (Subcutaneous Tissue) Exposed: Yes Necrotic Quality: Adherent Slough Tendon Exposed: No Muscle Exposed: No Joint Exposed: No Bone Exposed:  No Treatment Notes Wound #5 (Calcaneus) Wound Laterality: Right Cleanser Normal Saline Discharge Instruction: Wash your hands with soap and  water. Remove old dressing, discard into plastic bag and place into trash. Cleanse the wound with Normal Saline prior to applying a clean dressing using gauze sponges, not tissues or cotton balls. Do not scrub or use excessive force. Pat dry using gauze sponges, not tissue or cotton balls. Soap and Water Discharge Instruction: Gently cleanse wound with antibacterial soap, rinse and pat dry prior to dressing wounds Peri-Wound Care Topical Primary Dressing Hydrofera Blue Ready Transfer Foam, 2.5x2.5 (in/in) Discharge Instruction: cut to size of wound Secondary Dressing Gauze Discharge Instruction: As directed: dry, moistened with saline or moistened with Dakins Solution Secured With Medipore T - 19M Medipore H Soft Cloth Surgical T ape ape, 2x2 (in/yd) Compression Wrap Compression Stockings Add-Ons Electronic Signature(s) Edick, [name] ([id number]) [id number] Page 8 of 9 Signed: 06/30/2023 3:37:08 PM By: [name] RN Entered By: [name] on 06/24/2023 06:05:52 -------------------------------------------------------------------------------- Wound Assessment Details Patient Name: Date of Service: [name] [facility] 06/24/2023 8:45 A M Medical Record Number: [id number] Patient Account Number: [id number] Date of Birth/Sex: Treating RN: 1956-02-04 (67 y.o. [name]) [name] Primary Care Kory Panjwani: Dione Booze Other Clinician: Referring Emersynn Deatley: Treating Shametra Cumberland/Extender: [name] in Treatment: 82 Wound Status Wound Number: 7 Primary Pressure Ulcer Etiology: Wound Location: Sacrum Wound Open Wounding Event: Pressure Injury Status: Date Acquired: 07/01/2021 Comorbid Anemia, Sleep Apnea, Coronary Artery Disease, Deep Vein Weeks Of Treatment: 82 History: Thrombosis,  Hypertension, Type II Diabetes, History of pressure Clustered Wound: No wounds, Neuropathy Photos Wound Measurements Length: (cm) 2.5 Width: (cm) 2.5 Depth: (cm) 5 Area: (cm) 4.909 Volume: (cm) 24.544 % Reduction in Area: -56.2% % Reduction in Volume: -160.4% Epithelialization: None Tunneling: No Undermining: No Wound Description Classification: Category/Stage IV Exudate Amount: Medium Exudate Type: Serosanguineous Exudate Color: red, brown Foul Odor After Cleansing: No Slough/Fibrino No Wound Bed Granulation Amount: Large (67-100%) Exposed Structure Granulation Quality: Red Fascia Exposed: No Necrotic Amount: None Present (0%) Fat Layer (Subcutaneous Tissue) Exposed: Yes Tendon Exposed: No Muscle Exposed: No Joint Exposed: No Bone Exposed: No Treatment Notes Wound #7 (Sacrum) Billing, Zymere ([id number]) [id number] Page 9 of 9 Cleanser Normal Saline Discharge Instruction: Wash your hands with soap and water. Remove old dressing, discard into plastic bag and place into trash. Cleanse the wound with Normal Saline prior to applying a clean dressing using gauze sponges, not tissues or cotton balls. Do not scrub or use excessive force. Pat dry using gauze sponges, not tissue or cotton balls. Soap and Water Discharge Instruction: Gently cleanse wound with antibacterial soap, rinse and pat dry prior to dressing wounds Peri-Wound Care Topical calmoseptine Discharge Instruction: apply to excoriated areas / periwound Primary Dressing Hydrofera Blue Ready Transfer Foam, 4x5 (in/in) Discharge Instruction: Apply Hydrofera Blue Ready to wound bed as directed Secondary Dressing ABD Pad 5x9 (in/in) Discharge Instruction: Cover with ABD pad Secured With Compression Wrap Compression Stockings Add-Ons Electronic Signature(s) Signed: 06/30/2023 3:37:08 PM By: [name] RN Entered By: [name] on 06/24/2023  06:06:24 -------------------------------------------------------------------------------- Vitals Details Patient Name: Date of Service: [name] [facility] 06/24/2023 8:45 A M Medical Record Number: [id number] Patient Account Number: [id number] Date of Birth/Sex: Treating RN: 1956-01-26 (67 y.o. [name]) [name] Primary Care Briana Newman: Dione Booze Other Clinician: Referring Amri Lien: Treating Evella Kasal/Extender: [name] in Treatment: 82 Vital Signs Time Taken: 08:50 Temperature (F): 98.2 Height (in): 66 Pulse (bpm): 88 Weight (lbs): 279 Respiratory Rate (breaths/min): 18 Body Mass Index (BMI): 45 Blood Pressure (mmHg): 156/78 Reference Range: 80 - 120 mg / dl  Electronic Signature(s) Signed: 06/30/2023 3:37:08 PM By: [name] RN Entered By: [name] on 06/24/2023 06:04:12

## 2023-06-24 NOTE — Progress Notes (Addendum)
[name], [name] ([id number]) [id number] Page 1 of 12 Visit Report for 06/24/2023 Chief Complaint Document Details Patient Name: Date of Service: [name] [facility] 06/24/2023 8:45 A M Medical Record Number: [id number] Patient Account Number: [id number] Date of Birth/Sex: Treating RN: 1956/04/12 (67 y.o. [name]) [name] Primary Care Provider: Dione Booze Other Clinician: Referring Provider: Treating Provider/Extender: [name] in Treatment: 82 Information Obtained from: Patient Chief Complaint Sacral, right heel, and left leg ulcers Electronic Signature(s) Signed: 06/24/2023 8:49:55 AM By: Allen Derry PA-C Entered By: Allen Derry on 06/24/2023 05:49:55 -------------------------------------------------------------------------------- Debridement Details Patient Name: Date of Service: [name] [facility] 06/24/2023 8:45 A M Medical Record Number: [id number] Patient Account Number: [id number] Date of Birth/Sex: Treating RN: 10-20-1955 (67 y.o. [name]) [name] Primary Care Provider: Dione Booze Other Clinician: Referring Provider: Treating Provider/Extender: [name] in Treatment: 82 Debridement Performed for Assessment: Wound #5 Right Calcaneus Performed By: Physician Allen Derry, PA-C The following information was scribed by: [name] The information was scribed for: Allen Derry Debridement Type: Debridement Level of Consciousness (Pre-procedure): Awake and Alert Pre-procedure Verification/Time Out Yes - 09:40 Taken: Start Time: 09:40 Percent of Wound Bed Debrided: 100% T Area Debrided (cm): otal 5.89 Tissue and other material debrided: Viable, Non-Viable, Callus, Slough, Subcutaneous, Skin: Dermis , Skin: Epidermis, Biofilm, Slough Level: Skin/Subcutaneous Tissue Debridement Description: Excisional Instrument: Curette Bleeding: Minimum Hemostasis Achieved:  Pressure End Time: 09:46 [name], [name] ([id number]) [phone].pdf Page 2 of 12 Procedural Pain: 0 Post Procedural Pain: 0 Response to Treatment: Procedure was tolerated well Level of Consciousness (Post- Awake and Alert procedure): Post Debridement Measurements of Total Wound Length: (cm) 3 Stage: Category/Stage III Width: (cm) 2.5 Depth: (cm) 0.4 Volume: (cm) 2.356 Character of Wound/Ulcer Post Debridement: Improved Post Procedure Diagnosis Same as Pre-procedure Electronic Signature(s) Signed: 06/24/2023 3:46:23 PM By: Allen Derry PA-C Signed: 06/30/2023 3:37:08 PM By: [name] RN Entered By: [name] on 06/24/2023 06:45:52 -------------------------------------------------------------------------------- HPI Details Patient Name: Date of Service: [name] [facility] 06/24/2023 8:45 A M Medical Record Number: [id number] Patient Account Number: [id number] Date of Birth/Sex: Treating RN: 1955-07-29 (67 y.o. [name] Primary Care Provider: Dione Booze Other Clinician: Referring Provider: Treating Provider/Extender: [name] in Treatment: 82 History of Present Illness HPI Description: 05/29/2021 this is a patient who presents today for initial inspection here in the clinic concerning wounds that he has over the right heel and the sacral region. Unfortunately the sacral wound is starting to spread off to the right gluteal location due to how he sits always leaning towards the right side in his chair. His wife is present she is the primary caregiver though she is not home with him all the time she does have to work. She does do an excellent job however it appears to be in trying to keep things under good control for him. The patient is not able to change positions himself nor walk by himself so he is pretty much at the mercy of the position he is putting when she is gone and this tends to be his chair  which she sits and most of the day. Obviously this I think is the main culprit for what is going on currently. It was actually in January 2020 when the sacral wound started. It was in September 2022 when the wound started to spread more to the right gluteal location. Subsequently in August 2022 is when he had been in a skilled nursing facility and the  heel started to give him trouble as well. That does not seem to be doing nearly as poorly as the sacral region. He was hospitalized in October 2022 secondary to sepsis and this was in regard to the foot and was sent to skilled nursing again he is now back at home. He did have a wound VAC for the sacral wound over the summer 2022 but being in and out of facilities this ended up getting sent back. The patient does have [facility] home health that comes out 1 time per week to help with care. His most recent hemoglobin A1c was 6.9 in August 2022. 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 s. 07/24/2021 upon evaluation today patient appears to be doing decently well in regard to the pressure ulcer on the right heel as well as the sacral region. In general I think you are making some progress here which is great news. Overall the heel unfortunately had already closed previously when we saw him although it apparently reopened when he was working with physical therapy according to his wife. The area in the sacral region is doing well and looks clean there is still some depth here but I still think it would be difficult to wound VAC this region. His wife does an awesome job taking care of him. Is been so long since we have seen him because he has been in the hospital to be honest. 08/07/2021 upon evaluation today patient appears to be doing well at this time.  Fortunately I do not see any signs of active infection locally or systemically at this time which is great news. No fevers, chills, nausea, vomiting, or diarrhea. Unfortunately after I saw him on the 24th he actually ended up in the hospital in the 27th due to being septic. This was not due to the wounds but after looking at records actually due to a UTI. Fortunately he is doing much better and very happy in that regard. I do not see any signs of infection locally nor systemically at this time. 08/21/2021 upon evaluation today patient appears to be doing well with regard to his wound. Fortunately I think that the sacral region is doing decently well at this point which is great news. With regard to the foot this also does have some slough noted but I feel like we are making progress here. He does have some irritation around the right upper thigh/gluteal region. I feel like it is more towards the thigh. Nonetheless this does appear to be pressure related he spends a lot of time sitting up his caregiver states he really will not get in the bed and stay there like he should. [name], [name] ([id number]) [id number] Page 3 of 12 09/04/2021 upon evaluation patient appears to be doing decently well today in regard to the wound on his heel as well as the sacral area. I am actually very pleased with both and how things are appearing currently. There does not appear to be any evidence of active infection I think that his caregiver is doing an awesome job with regard to the wound care here. She is present today as well and I did discuss this with her. 09/25/2021 upon evaluation today patient appears to be doing well with regard to his wound on the sacral region. His right heel is also doing well. Unfortunately he has  a new area on his left heel which does appear to be pressure injury. I do not see any evidence of active infection locally or systemically which is great news no  fevers, chills, nausea, vomiting, or diarrhea. Readmission: 11-22-2021 upon evaluation today patient presents for reevaluation here in the clinic concerning issues with his wounds. Since have last seen him he was in the hospital and then subsequently ended up in a skilled nursing facility. Subsequently period of time in the facility he actually had a breakdown in the right thigh location which has been an issue for him. Fortunately I do not see any evidence of active infection locally or systemically which is great news and I am very pleased in that regard. Nonetheless he does have still the wounds on both heels as well as the wound in the sacral area and the new area in the right upper thigh/gluteal region. 12-13-2021 upon evaluation today patient appears to be doing well currently in regard to his wounds. The left heel is going to require some sharp debridement. Fortunately the right heel seems to be doing quite well. Overall I think his gluteal region as well as sacral region also are doing well. 01-10-2022 upon evaluation today patient appears to be doing better in regard to some areas unfortunately his sacral area is not doing as well. It seems of gotten worse his caregiver states when he was in the hospital recently they really did not offloading. Fortunately I do not see any evidence of active infection locally or systemically at this time. 02-01-2022 upon evaluation today patient appears to be doing well currently in regard to his wounds. He is going require some sharp debridement of clearway some of the necrotic debris. Fortunately I do not see any evidence of active infection locally or systemically which is great news. No fevers, chills, nausea, vomiting, or diarrhea. 02-22-2022 upon evaluation today patient appears to be doing poorly in regard to his right heel the left heel is completely closed. Unfortunately I think he is got some deep tissue injury to this area I am going to do a culture at  this spot. With that being said he is having 2 other significant issues in regard to the left thigh this is showing signs of cellulitis all the way down into the thigh and was seeing definite temperature difference in the left thigh versus the right thigh when feeling. It also is of note that the patient is also having some issues here with trouble in regard to having fevers his wife has been giving him Tylenol and ibuprofen to help keep the fevers under control but he has been as high as 102 this is pretty much been going on since Monday or Tuesday of this week. Nonetheless I am concerned I am not exactly sure where the cellulitis on the left thigh is coming from not sure if this is emanating from his sacral region that is a possibility or if there is something completely different going on either way with the fevers I am more worried that he needs to go get this checked out as soon as possible. 03-08-2022 upon evaluation today patient appears to be doing well with regard to his heel as well as sacral region. Both are showing signs of significant improvement compared to the last time I saw him. Overall I think we are in a much better spot 04-04-2022 upon evaluation today patient appears to be doing about the same in regard to his wounds. I do not see  any signs of improvement nor worsening at this point. Fortunately there is no evidence of active infection at this time. He is not staying off of this is much as he needs to in fact is not even sleeping in the hospital bed with the alternating air mattress. 04-16-2022 upon evaluation today patient appears to be doing well currently in regard to his wound. He has been tolerating the dressing changes without complication. Fortunately I do not see any evidence of infection. The heel actually looks quite well the sacral area actually is still quite deep. From my questioning and discussion with him today I do not believe that he is really staying off of this as  much as he should. He seemed a little uncomfortable during that portion of the conversation today. I think that is contributing quite a bit to his lack of progress with regard to healing. 11/7; patient with decubitus ulcers on his right heel as well as his lower sacrum. We have been using Hydrofera Blue. He has home health predominantly for Foley catheter management. There have been some issues getting the Hydrofera Blue through home health but apparently the patient's wife has some supplies and is managing to get these changed.They come every 3 weeks because of transportation difficulties 05-28-2022 upon evaluation today patient appears to be doing decently well currently in regard to his wounds. He still has a depth to the wound in the sacral region but again this does seem to be a little bit less than previous. He has been using Hydrofera Blue both on this in the heel ulcer. Both are going to require some cleaning today sharp debridement on the heel I think is cleaning with saline gauze and the sterile Q-tip is probably sufficient for the sacral area which does not appear to have too much necrotic tissue. 06-18-2022 upon evaluation today patient's wounds are showing signs of doing decently well. Fortunately I do not see any signs of active infection locally nor systemically at this time which is great news. No fevers, chills, nausea, vomiting, or diarrhea. 07-15-2022 upon evaluation today patient appears to be doing well currently in regard to his wound. He has been tolerating the dressing changes without complication. Fortunately I think that the heel is doing well except for where it is causing pressure to the heel posteriorly and this apparently is due to some kind of exercise machine that his children got him. Nonetheless I am not sure that that is going to be helpful especially if it is causing discomfort and pain. And especially if it is making the wound worse which is an even bigger deal. With  regard to the patient's sacral region this is draining quite significantly. I do think a wound VAC would be ideal here and I would recommend that we go ahead and try to see about getting this ordered for him as soon as possible. The patient's wife and the patient are in agreement with this plan. 08-05-2022 upon evaluation today patient appears to be doing well currently in regard to both his heel and his sacral region. Unfortunately he is not eligible for a wound VAC as he is previously already undergone all the treatment that is allowable under Medicare guidelines for his sacral wound. This is quite unfortunate as I felt like that could have potentially help this speed this up but nonetheless it is where we stand. For that reason I think the [facility] is probably still the best way to continue at this point and I discussed  that with the patient and his wife today. 08-26-2022 upon evaluation today patient appears to be doing well currently in regard to his wound. He on the heel of his looking much better I did have to perform some debridement here. In regard to the sacral area no debridement was necessary it looks to clean this has been although there was a lot of Hydrofera Blue and there much more than is probably necessary. 09-15-2021 upon evaluation today patient appears to be doing well currently in regard to his heel ulcer which is looking okay although the gluteal region specifically the sacral wound is showing signs of some issues there. Fortunately I do not see any evidence of infection significantly to the heel although I am not so sure about the sacral region. I think he probably would benefit from initiation of antibiotics. 10-01-2022 upon evaluation today patient unfortunately had a hospital stay where he ended up having his catheter which had been apparently blowing up into the urethra. With that being said he had traumatic bleeding and subsequently ended up in the hospital I did review  that discharge summary as well. The good news is he is doing somewhat better at this point but he did end up laying on his backside a lot causing the wound on the sacral area to actually be worse today compared to where we have been previous. This is not good and not the direction we want to go. Again the depth is what was actually more significant. 10-22-2022 upon evaluation today patient appears to be doing decently well in regard to his heel ulcer. I do not see any signs of active infection locally nor systemically at this time which is great news. No fevers, chills, nausea, vomiting, or diarrhea. 11-12-2022 upon evaluation today patient appears to be doing well currently with regard to his heel ulcer which is actually showing signs of excellent progress. Fortunately I do not see any signs of infection and I think that he is making good progress here. Fortunately I do not see any signs of infection with regard to the sacral area either. [name], [name] ([id number]) [id number] Page 4 of 12 12-03-2022 upon evaluation today patient appears to be doing well currently in regard to his wounds. The heel looks to be almost healed but it is still open. With that being said regular continue with the [facility] to both locations. Unfortunately regard to the gluteal region this is not doing quite as well. I discussed with the patient and his wife that we really need to try to see what can be done in order to make sure this is pectin well and stays in. We also had an incident where the patient's wife has a picture of where a plastic bag containing Hydrofera Blue inside of it was actually taped to the wound on his gluteal region followed by an ABD pad secured with tape honestly if I did not see the pictures I would not do believe that she tells me that she is actually going to head over to [facility] home office when she leaves here to have a discussion with somebody about this as  that is unacceptable I agree. 12-24-2022 upon evaluation today patient appears to be doing well currently in regard to his heel which is looking better the sacral area is not looking quite as good. It is not infected and is not worse but is also not a lot improved compared to last time I saw him. Again he still is not staying off  of this asking today how he was doing with staying off of his gluteal region and offloading and he tells me not very good. 01-14-2023 upon evaluation today patient appears to be doing well currently in regard to his sacral wound which I feel like is making some headway towards closure little by little. Unfortunately his heel is actually significantly worse compared to last I saw him. Fortunately I do not see any signs of infection at this time. 02-04-23 evaluation today patient appears to be doing well with regards to the sacral wound was not doing quite as good. Fortunately I do not see any signs of infection at either site right now although we will keep a close eye on this heal as we move forward. 8/27; this is a patient we follow for a sacral wound on the right heel wound which were pressure ulcers and this man who is fairly disabled. According to his wife he does not offload this properly he is up sitting in his wheelchair quite a bit during the day. On the sacrum we are using Hydrofera Blue ABDs and on the heel Hydrofera Blue blue and gauze. In general the wounds I think are stable not changing too much recently. 04-01-2023 upon evaluation today patient appears to be doing well currently in regard to the sacral wound which is not worse but is also not better. It has been about a month since have seen him as he was septic due to UTI and ended up in rehab following due to being weak. With that being said he does have a new wound on the left lower extremity which is secondary to having been placed in the wheelchair left they are too long during the time he was in rehab according  to his wife. 04-22-2023 upon evaluation today patient appears to be doing well currently in regard to his wounds which all actually appear to be doing a little bit better if not completely better with the ankle already being healed. With that being said he was in the hospital from October 11 through October 19 due to what is stated to be sepsis secondary to catheter associated UTI specifically Enterococcus is what was mentioned. With that being said he seems to be doing much better at this point. They do have a Hoyer at home which has been of great benefit and he is staying off of this more at home which I think has helped all of his wounds as well. Fortunately I do not see any signs of active infection locally or systemically at the moment which is great news and I do believe that he is doing well in that regard. 05-13-2023 upon evaluation today patient's wounds unfortunately do not appear to be doing quite as well as they were last week. I do not see any signs of infection and in fact has not been just a week but about 3 weeks since I last saw him. Fortunately I do not see any evidence of worsening as far as infection is concerned but I did see signs of deep tissue injury to the heel and some expansion of the outer rim of the wound on the sacral location. 06-03-2023 upon evaluation today patient unfortunately appears to be doing significantly worse with regard to his wounds compared to where he was previous. Fortunately I do not see any signs of active infection at this time which is good news but I do believe that the patient is making quite a bit of trouble for himself by  sitting too long and putting pressure on the gluteal region. This actually appears to be doing significantly worse compared to what it was previous and I discussed that with him today as well. I think if he keeps doing this he is getting up with a wound that is 4 times the size of the 1 we have been dealing with at this point. I  also advised him of this today in no uncertain terms. 06-24-23 on evaluation patient's wound in the sacral area actually appears to be doing excellent today. In comparison to the last time I saw him he has really been trying to stay off of this and it shows as he's doing much better. With that being said as he is still causing some issues here. We are gonna need to perform some debridement at that location. Electronic Signature(s) Signed: 06/24/2023 3:46:23 PM By: Allen Derry PA-C Entered By: Allen Derry on 06/24/2023 12:07:50 -------------------------------------------------------------------------------- Physical Exam Details Patient Name: Date of Service: [name] [facility] 06/24/2023 8:45 A M Medical Record Number: [id number] Patient Account Number: [id number] Date of Birth/Sex: Treating RN: Nov 28, 1955 (67 y.o. [name]) [name] Primary Care Provider: Dione Booze Other Clinician: Referring Provider: Treating Provider/Extender: [name] in Treatment: 23 Constitutional Obese and well-hydrated in no acute distress. Respiratory normal breathing without difficulty. Psychiatric this patient is able to make decisions and demonstrates good insight into disease process. Alert and Oriented x 3. pleasant and cooperative. [name], [name] ([id number]) [id number] Page 5 of 12 Notes Patient heal wound to require sharp debridement today to clear away the debris, including slough and biofilm down to good subcutaneous tissue. He tolerated that without any pain whatsoever he does have neuropathy. I did not have to perform any debridement in regard to the sacral area as it appears to be doing well today this is greatly improved. Electronic Signature(s) Signed: 06/24/2023 3:46:23 PM By: Allen Derry PA-C Entered By: Allen Derry on 06/24/2023  12:08:24 -------------------------------------------------------------------------------- Physician Orders Details Patient Name: Date of Service: [name] [facility] 06/24/2023 8:45 A M Medical Record Number: [id number] Patient Account Number: [id number] Date of Birth/Sex: Treating RN: Nov 27, 1955 (67 y.o. [name]) [name] Primary Care Provider: Dione Booze Other Clinician: Referring Provider: Treating Provider/Extender: [name] in Treatment: 82 The following information was scribed by: [name] The information was scribed for: Allen Derry Verbal / Phone Orders: No Diagnosis Coding ICD-10 Coding Code Description L89.154 Pressure ulcer of sacral region, stage 4 L89.613 Pressure ulcer of right heel, stage 3 L97.822 Non-pressure chronic ulcer of other part of left lower leg with fat layer exposed M62.81 Muscle weakness (generalized) F31.9 Bipolar disorder, unspecified E11.622 Type 2 diabetes mellitus with other skin ulcer I10 Essential (primary) hypertension Z79.01 Long term (current) use of anticoagulants 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 Home Health [facility] for wound care. May utilize formulary equivalent dressing for wound treatment orders unless otherwise specified. Home Health Nurse may visit PRN to address patients wound care needs. Beverly Gust [phone] **Please direct any NON-WOUND related issues/requests for orders to patient's Primary Care Physician. **If current dressing causes regression in wound condition, may D/C ordered dressing product/s and apply Normal Saline Moist Dressing daily until next Wound Healing Center or Other MD appointment. **Notify Wound Healing Center of regression in wound condition at [phone]. Bathing/ Shower/ Hygiene No tub bath. Anesthetic (Use 'Patient Medications' Section for Anesthetic Order Entry) Lidocaine applied  to wound bed Off-Loading  Gel wheelchair cushion Low air-loss mattress (Group 2) - Sleep in bed every night. Turn and reposition every 2 hours - keep pressure off of the sacrum and heels wounds Other: - PRAFO boot in bed keep pressure off of sacrum/gluteus and heels- Additional Orders / Instructions SINGLETON, POLEK ([id number]) [id number] Page 6 of 12 Follow Nutritious Diet and Increase Protein Intake Wound Treatment Wound #5 - Calcaneus Wound Laterality: Right Cleanser: Normal Saline 3 x Per Week/30 Days Discharge Instructions: Wash your hands with soap and water. Remove old dressing, discard into plastic bag and place into trash. Cleanse the wound with Normal Saline prior to applying a clean dressing using gauze sponges, not tissues or cotton balls. Do not scrub or use excessive force. Pat dry using gauze sponges, not tissue or cotton balls. Cleanser: Soap and Water 3 x Per Week/30 Days Discharge Instructions: Gently cleanse wound with antibacterial soap, rinse and pat dry prior to dressing wounds Prim Dressing: Hydrofera Blue Ready Transfer Foam, 2.5x2.5 (in/in) ary 3 x Per Week/30 Days Discharge Instructions: cut to size of wound Secondary Dressing: Gauze 3 x Per Week/30 Days Discharge Instructions: As directed: dry, moistened with saline or moistened with Dakins Solution Secured With: Medipore T - 65M Medipore H Soft Cloth Surgical T ape ape, 2x2 (in/yd) 3 x Per Week/30 Days Wound #7 - Sacrum Cleanser: Normal Saline 1 x Per Day/30 Days Discharge Instructions: Wash your hands with soap and water. Remove old dressing, discard into plastic bag and place into trash. Cleanse the wound with Normal Saline prior to applying a clean dressing using gauze sponges, not tissues or cotton balls. Do not scrub or use excessive force. Pat dry using gauze sponges, not tissue or cotton balls. Cleanser: Soap and Water 1 x Per Day/30 Days Discharge Instructions: Gently  cleanse wound with antibacterial soap, rinse and pat dry prior to dressing wounds Topical: calmoseptine 1 x Per Day/30 Days Discharge Instructions: apply to excoriated areas / periwound Prim Dressing: Hydrofera Blue Ready Transfer Foam, 4x5 (in/in) 1 x Per Day/30 Days ary Discharge Instructions: Apply Hydrofera Blue Ready to wound bed as directed Secondary Dressing: ABD Pad 5x9 (in/in) 1 x Per Day/30 Days Discharge Instructions: Cover with ABD pad Electronic Signature(s) Signed: 06/24/2023 3:46:23 PM By: Allen Derry PA-C Signed: 06/30/2023 3:37:08 PM By: [name] RN Entered By: [name] on 06/24/2023 06:41:06 -------------------------------------------------------------------------------- Problem List Details Patient Name: Date of Service: [name] [facility] 06/24/2023 8:45 A M Medical Record Number: [id number] Patient Account Number: [id number] Date of Birth/Sex: Treating RN: 01/06/1956 (67 y.o. [name] Primary Care Provider: Dione Booze Other Clinician: Referring Provider: Treating Provider/Extender: [name] in Treatment: 82 Active Problems ICD-10 Encounter Code Description Active Date MDM Diagnosis L89.154 Pressure ulcer of sacral region, stage 4 11/22/2021 No Yes Angelo, [name] ([id number]) [id number] Page 7 of 12 L89.613 Pressure ulcer of right heel, stage 3 11/22/2021 No Yes L97.822 Non-pressure chronic ulcer of other part of left lower leg with fat layer exposed10/07/2022 No Yes M62.81 Muscle weakness (generalized) 11/22/2021 No Yes F31.9 Bipolar disorder, unspecified 11/22/2021 No Yes E11.622 Type 2 diabetes mellitus with other skin ulcer 11/22/2021 No Yes I10 Essential (primary) hypertension 11/22/2021 No Yes Z79.01 Long term (current) use of anticoagulants 11/22/2021 No Yes 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 11/22/2021 No Yes Inactive Problems ICD-10 Code Description  Active Date Inactive Date L89.623 Pressure ulcer of left heel, stage 3 11/22/2021 11/22/2021 Resolved Problems ICD-10 Code Description Active Date Resolved Date L24.A0 Irritant contact  dermatitis due to friction or contact with body fluids, unspecified 11/22/2021 11/22/2021 L98.412 Non-pressure chronic ulcer of buttock with fat layer exposed 11/22/2021 11/22/2021 Electronic Signature(s) Signed: 06/24/2023 8:49:52 AM By: Allen Derry PA-C Entered By: Allen Derry on 06/24/2023 05:49:52 -------------------------------------------------------------------------------- Progress Note Details Patient Name: Date of Service: [name] [facility] 06/24/2023 8:45 A M Medical Record Number: [id number] Patient Account Number: [id number] [name], [name] ([id number]) [phone].pdf Page 8 of 12 Date of Birth/Sex: Treating RN: [birth year] (67 y.o. [name]) [name] Primary Care Provider: Dione Booze Other Clinician: Referring Provider: Treating Provider/Extender: [name] in Treatment: 82 Subjective Chief Complaint Information obtained from Patient Sacral, right heel, and left leg ulcers History of Present Illness (HPI) 05/29/2021 this is a patient who presents today for initial inspection here in the clinic concerning wounds that he has over the right heel and the sacral region. Unfortunately the sacral wound is starting to spread off to the right gluteal location due to how he sits always leaning towards the right side in his chair. His wife is present she is the primary caregiver though she is not home with him all the time she does have to work. She does do an excellent job however it appears to be in trying to keep things under good control for him. The patient is not able to change positions himself nor walk by himself so he is pretty much at the mercy of the position he is putting when she is gone and this tends to be his chair which she  sits and most of the day. Obviously this I think is the main culprit for what is going on currently. It was actually in January 2020 when the sacral wound started. It was in September 2022 when the wound started to spread more to the right gluteal location. Subsequently in August 2022 is when he had been in a skilled nursing facility and the heel started to give him trouble as well. That does not seem to be doing nearly as poorly as the sacral region. He was hospitalized in October 2022 secondary to sepsis and this was in regard to the foot and was sent to skilled nursing again he is now back at home. He did have a wound VAC for the sacral wound over the summer 2022 but being in and out of facilities this ended up getting sent back. The patient does have [facility] home health that comes out 1 time per week to help with care. His most recent hemoglobin A1c was 6.9 in August 2022. 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 s. 07/24/2021 upon evaluation today patient appears to be doing decently well in regard to the pressure ulcer on the right heel as well as the sacral region. In general I think you are making some progress here which is great news. Overall the heel unfortunately had already closed previously when we saw him although it apparently reopened when he was working with physical therapy according to his wife. The area in the sacral region is doing well and looks clean there is still some depth here but I still think it would be difficult to wound VAC this region. His wife does an awesome job taking care of him. Is been so long since we have seen him because he has been in the hospital to be honest.  08/07/2021 upon evaluation today patient appears to be doing well at this time. Fortunately I  do not see any signs of active infection locally or systemically at this time which is great news. No fevers, chills, nausea, vomiting, or diarrhea. Unfortunately after I saw him on the 24th he actually ended up in the hospital in the 27th due to being septic. This was not due to the wounds but after looking at records actually due to a UTI. Fortunately he is doing much better and very happy in that regard. I do not see any signs of infection locally nor systemically at this time. 08/21/2021 upon evaluation today patient appears to be doing well with regard to his wound. Fortunately I think that the sacral region is doing decently well at this point which is great news. With regard to the foot this also does have some slough noted but I feel like we are making progress here. He does have some irritation around the right upper thigh/gluteal region. I feel like it is more towards the thigh. Nonetheless this does appear to be pressure related he spends a lot of time sitting up his caregiver states he really will not get in the bed and stay there like he should. 09/04/2021 upon evaluation patient appears to be doing decently well today in regard to the wound on his heel as well as the sacral area. I am actually very pleased with both and how things are appearing currently. There does not appear to be any evidence of active infection I think that his caregiver is doing an awesome job with regard to the wound care here. She is present today as well and I did discuss this with her. 09/25/2021 upon evaluation today patient appears to be doing well with regard to his wound on the sacral region. His right heel is also doing well. Unfortunately he has a new area on his left heel which does appear to be pressure injury. I do not see any evidence of active infection locally or systemically which is great news no fevers, chills, nausea, vomiting, or diarrhea. Readmission: 11-22-2021 upon evaluation today patient  presents for reevaluation here in the clinic concerning issues with his wounds. Since have last seen him he was in the hospital and then subsequently ended up in a skilled nursing facility. Subsequently period of time in the facility he actually had a breakdown in the right thigh location which has been an issue for him. Fortunately I do not see any evidence of active infection locally or systemically which is great news and I am very pleased in that regard. Nonetheless he does have still the wounds on both heels as well as the wound in the sacral area and the new area in the right upper thigh/gluteal region. 12-13-2021 upon evaluation today patient appears to be doing well currently in regard to his wounds. The left heel is going to require some sharp debridement. Fortunately the right heel seems to be doing quite well. Overall I think his gluteal region as well as sacral region also are doing well. 01-10-2022 upon evaluation today patient appears to be doing better in regard to some areas unfortunately his sacral area is not doing as well. It seems of gotten worse his caregiver states when he was in the hospital recently they really did not offloading. Fortunately I do not see any evidence of active infection locally or systemically at this time. 02-01-2022 upon evaluation today patient appears to be doing well currently in regard to  his wounds. He is going require some sharp debridement of clearway some of the necrotic debris. Fortunately I do not see any evidence of active infection locally or systemically which is great news. No fevers, chills, nausea, vomiting, or diarrhea. 02-22-2022 upon evaluation today patient appears to be doing poorly in regard to his right heel the left heel is completely closed. Unfortunately I think he is got some deep tissue injury to this area I am going to do a culture at this spot. With that being said he is having 2 other significant issues in regard to the left  thigh this is showing signs of cellulitis all the way down into the thigh and was seeing definite temperature difference in the left thigh versus the right thigh when feeling. It also is of note that the patient is also having some issues here with trouble in regard to having fevers his wife has been giving him Tylenol and ibuprofen to help keep the fevers under control but he has been as high as 102 this is pretty much been going on since Monday or Tuesday of this week. Nonetheless I am concerned I am not exactly sure where the cellulitis on the left thigh is coming from not sure if this is emanating from his sacral region that is a possibility or if there is something completely different going on either way with the fevers I am more worried that he needs to go get this checked out as soon as possible. 03-08-2022 upon evaluation today patient appears to be doing well with regard to his heel as well as sacral region. Both are showing signs of significant improvement compared to the last time I saw him. Overall I think we are in a much better spot 04-04-2022 upon evaluation today patient appears to be doing about the same in regard to his wounds. I do not see any signs of improvement nor worsening at this point. Fortunately there is no evidence of active infection at this time. He is not staying off of this is much as he needs to in fact is not even sleeping in the hospital bed with the alternating air mattress. 04-16-2022 upon evaluation today patient appears to be doing well currently in regard to his wound. He has been tolerating the dressing changes without [name], [name] ([id number]) [id number] Page 9 of 12 complication. Fortunately I do not see any evidence of infection. The heel actually looks quite well the sacral area actually is still quite deep. From my questioning and discussion with him today I do not believe that he is really staying off of this as much  as he should. He seemed a little uncomfortable during that portion of the conversation today. I think that is contributing quite a bit to his lack of progress with regard to healing. 11/7; patient with decubitus ulcers on his right heel as well as his lower sacrum. We have been using Hydrofera Blue. He has home health predominantly for Foley catheter management. There have been some issues getting the Hydrofera Blue through home health but apparently the patient's wife has some supplies and is managing to get these changed.They come every 3 weeks because of transportation difficulties 05-28-2022 upon evaluation today patient appears to be doing decently well currently in regard to his wounds. He still has a depth to the wound in the sacral region but again this does seem to be a little bit less than previous. He has been using Hydrofera Blue both on this in the heel ulcer.  Both are going to require some cleaning today sharp debridement on the heel I think is cleaning with saline gauze and the sterile Q-tip is probably sufficient for the sacral area which does not appear to have too much necrotic tissue. 06-18-2022 upon evaluation today patient's wounds are showing signs of doing decently well. Fortunately I do not see any signs of active infection locally nor systemically at this time which is great news. No fevers, chills, nausea, vomiting, or diarrhea. 07-15-2022 upon evaluation today patient appears to be doing well currently in regard to his wound. He has been tolerating the dressing changes without complication. Fortunately I think that the heel is doing well except for where it is causing pressure to the heel posteriorly and this apparently is due to some kind of exercise machine that his children got him. Nonetheless I am not sure that that is going to be helpful especially if it is causing discomfort and pain. And especially if it is making the wound worse which is an even bigger deal. With  regard to the patient's sacral region this is draining quite significantly. I do think a wound VAC would be ideal here and I would recommend that we go ahead and try to see about getting this ordered for him as soon as possible. The patient's wife and the patient are in agreement with this plan. 08-05-2022 upon evaluation today patient appears to be doing well currently in regard to both his heel and his sacral region. Unfortunately he is not eligible for a wound VAC as he is previously already undergone all the treatment that is allowable under Medicare guidelines for his sacral wound. This is quite unfortunate as I felt like that could have potentially help this speed this up but nonetheless it is where we stand. For that reason I think the [facility] is probably still the best way to continue at this point and I discussed that with the patient and his wife today. 08-26-2022 upon evaluation today patient appears to be doing well currently in regard to his wound. He on the heel of his looking much better I did have to perform some debridement here. In regard to the sacral area no debridement was necessary it looks to clean this has been although there was a lot of Hydrofera Blue and there much more than is probably necessary. 09-15-2021 upon evaluation today patient appears to be doing well currently in regard to his heel ulcer which is looking okay although the gluteal region specifically the sacral wound is showing signs of some issues there. Fortunately I do not see any evidence of infection significantly to the heel although I am not so sure about the sacral region. I think he probably would benefit from initiation of antibiotics. 10-01-2022 upon evaluation today patient unfortunately had a hospital stay where he ended up having his catheter which had been apparently blowing up into the urethra. With that being said he had traumatic bleeding and subsequently ended up in the hospital I did review  that discharge summary as well. The good news is he is doing somewhat better at this point but he did end up laying on his backside a lot causing the wound on the sacral area to actually be worse today compared to where we have been previous. This is not good and not the direction we want to go. Again the depth is what was actually more significant. 10-22-2022 upon evaluation today patient appears to be doing decently well in regard to his  heel ulcer. I do not see any signs of active infection locally nor systemically at this time which is great news. No fevers, chills, nausea, vomiting, or diarrhea. 11-12-2022 upon evaluation today patient appears to be doing well currently with regard to his heel ulcer which is actually showing signs of excellent progress. Fortunately I do not see any signs of infection and I think that he is making good progress here. Fortunately I do not see any signs of infection with regard to the sacral area either. 12-03-2022 upon evaluation today patient appears to be doing well currently in regard to his wounds. The heel looks to be almost healed but it is still open. With that being said regular continue with the [facility] to both locations. Unfortunately regard to the gluteal region this is not doing quite as well. I discussed with the patient and his wife that we really need to try to see what can be done in order to make sure this is pectin well and stays in. We also had an incident where the patient's wife has a picture of where a plastic bag containing Hydrofera Blue inside of it was actually taped to the wound on his gluteal region followed by an ABD pad secured with tape honestly if I did not see the pictures I would not do believe that she tells me that she is actually going to head over to [facility] home office when she leaves here to have a discussion with somebody about this as that is unacceptable I agree. 12-24-2022 upon evaluation today patient appears to be  doing well currently in regard to his heel which is looking better the sacral area is not looking quite as good. It is not infected and is not worse but is also not a lot improved compared to last time I saw him. Again he still is not staying off of this asking today how he was doing with staying off of his gluteal region and offloading and he tells me not very good. 01-14-2023 upon evaluation today patient appears to be doing well currently in regard to his sacral wound which I feel like is making some headway towards closure little by little. Unfortunately his heel is actually significantly worse compared to last I saw him. Fortunately I do not see any signs of infection at this time. 02-04-23 evaluation today patient appears to be doing well with regards to the sacral wound was not doing quite as good. Fortunately I do not see any signs of infection at either site right now although we will keep a close eye on this heal as we move forward. 8/27; this is a patient we follow for a sacral wound on the right heel wound which were pressure ulcers and this man who is fairly disabled. According to his wife he does not offload this properly he is up sitting in his wheelchair quite a bit during the day. On the sacrum we are using Hydrofera Blue ABDs and on the heel Hydrofera Blue blue and gauze. In general the wounds I think are stable not changing too much recently. 04-01-2023 upon evaluation today patient appears to be doing well currently in regard to the sacral wound which is not worse but is also not better. It has been about a month since have seen him as he was septic due to UTI and ended up in rehab following due to being weak. With that being said he does have a new wound on the left lower extremity which is  secondary to having been placed in the wheelchair left they are too long during the time he was in rehab according to his wife. 04-22-2023 upon evaluation today patient appears to be doing well  currently in regard to his wounds which all actually appear to be doing a little bit better if not completely better with the ankle already being healed. With that being said he was in the hospital from October 11 through October 19 due to what is stated to be sepsis secondary to catheter associated UTI specifically Enterococcus is what was mentioned. With that being said he seems to be doing much better at this point. They do have a Hoyer at home which has been of great benefit and he is staying off of this more at home which I think has helped all of his wounds as well. Fortunately I do not see any signs of active infection locally or systemically at the moment which is great news and I do believe that he is doing well in that regard. 05-13-2023 upon evaluation today patient's wounds unfortunately do not appear to be doing quite as well as they were last week. I do not see any signs of infection and in fact has not been just a week but about 3 weeks since I last saw him. Fortunately I do not see any evidence of worsening as far as infection is concerned but I did see signs of deep tissue injury to the heel and some expansion of the outer rim of the wound on the sacral location. 06-03-2023 upon evaluation today patient unfortunately appears to be doing significantly worse with regard to his wounds compared to where he was previous. Fortunately I do not see any signs of active infection at this time which is good news but I do believe that the patient is making quite a bit of trouble for himself by sitting too long and putting pressure on the gluteal region. This actually appears to be doing significantly worse compared to what it was previous and I discussed that with him today as well. I think if he keeps doing this he is getting up with a wound that is 4 times the size of the 1 we have been dealing with at this point. I also advised him of this today in no uncertain terms. [name], [name]  ([id number]) [id number] Page 10 of 12 06-24-23 on evaluation patient's wound in the sacral area actually appears to be doing excellent today. In comparison to the last time I saw him he has really been trying to stay off of this and it shows as he's doing much better. With that being said as he is still causing some issues here. We are gonna need to perform some debridement at that location. Objective Constitutional Obese and well-hydrated in no acute distress. Vitals Time Taken: 8:50 AM, Height: 66 in, Weight: 279 lbs, BMI: 45, Temperature: 98.2 F, Pulse: 88 bpm, Respiratory Rate: 18 breaths/min, Blood Pressure: 156/78 mmHg. Respiratory normal breathing without difficulty. Psychiatric this patient is able to make decisions and demonstrates good insight into disease process. Alert and Oriented x 3. pleasant and cooperative. General Notes: Patient heal wound to require sharp debridement today to clear away the debris, including slough and biofilm down to good subcutaneous tissue. He tolerated that without any pain whatsoever he does have neuropathy. I did not have to perform any debridement in regard to the sacral area as it appears to be doing well today this is greatly improved. Integumentary (Hair, Skin) Wound #  5 status is Open. Original cause of wound was Pressure Injury. The date acquired was: 07/01/2021. The wound has been in treatment 82 weeks. The wound is located on the Right Calcaneus. The wound measures 3cm length x 2.5cm width x 0.4cm depth; 5.89cm^2 area and 2.356cm^3 volume. There is Fat Layer (Subcutaneous Tissue) exposed. There is no tunneling or undermining noted. There is a medium amount of serosanguineous drainage noted. There is small (1-33%) red granulation within the wound bed. There is a large (67-100%) amount of necrotic tissue within the wound bed including Adherent Slough. Wound #7 status is Open. Original cause of wound was Pressure Injury. The  date acquired was: 07/01/2021. The wound has been in treatment 82 weeks. The wound is located on the Sacrum. The wound measures 2.5cm length x 2.5cm width x 5cm depth; 4.909cm^2 area and 24.544cm^3 volume. There is Fat Layer (Subcutaneous Tissue) exposed. There is no tunneling or undermining noted. There is a medium amount of serosanguineous drainage noted. There is large (67- 100%) red granulation within the wound bed. There is no necrotic tissue within the wound bed. Assessment Active Problems ICD-10 Pressure ulcer of sacral region, stage 4 Pressure ulcer of right heel, stage 3 Non-pressure chronic ulcer of other part of left lower leg with fat layer exposed Muscle weakness (generalized) Bipolar disorder, unspecified Type 2 diabetes mellitus with other skin ulcer Essential (primary) hypertension Long term (current) use of anticoagulants Personal history of traumatic brain injury Procedures Wound #5 Pre-procedure diagnosis of Wound #5 is a Pressure Ulcer located on the Right Calcaneus . There was a Excisional Skin/Subcutaneous Tissue Debridement with a total area of 5.89 sq cm performed by Allen Derry, PA-C. With the following instrument(s): Curette to remove Viable and Non-Viable tissue/material. Material removed includes Callus, Subcutaneous Tissue, Slough, Skin: Dermis, Skin: Epidermis, and Biofilm. No specimens were taken. A time out was conducted at 09:40, prior to the start of the procedure. A Minimum amount of bleeding was controlled with Pressure. The procedure was tolerated well with a pain level of 0 throughout and a pain level of 0 following the procedure. Post Debridement Measurements: 3cm length x 2.5cm width x 0.4cm depth; 2.356cm^3 volume. Post debridement Stage noted as Category/Stage III. Character of Wound/Ulcer Post Debridement is improved. Post procedure Diagnosis Wound #5: Same as Pre-Procedure Plan Follow-up Appointments: Return Appointment in 3  weeks. [name], [name] ([id number]) [id number] Page 11 of 12 Home Health: [facility] for wound care. May utilize formulary equivalent dressing for wound treatment orders unless otherwise specified. Home Health Nurse may visit PRN to address patients wound care needs. Beverly Gust [phone] **Please direct any NON-WOUND related issues/requests for orders to patient's Primary Care Physician. **If current dressing causes regression in wound condition, may D/C ordered dressing product/s and apply Normal Saline Moist Dressing daily until next Wound Healing Center or Other MD appointment. **Notify Wound Healing Center of regression in wound condition at [phone]. Bathing/ Shower/ Hygiene: No tub bath. Anesthetic (Use 'Patient Medications' Section for Anesthetic Order Entry): Lidocaine applied to wound bed Off-Loading: Gel wheelchair cushion Low air-loss mattress (Group 2) - Sleep in bed every night. Turn and reposition every 2 hours - keep pressure off of the sacrum and heels wounds Other: - PRAFO boot in bed keep pressure off of sacrum/gluteus and heels- Additional Orders / Instructions: Follow Nutritious Diet and Increase Protein Intake WOUND #5: - Calcaneus Wound Laterality: Right Cleanser: Normal Saline 3 x Per Week/30 Days Discharge Instructions: Wash your hands with soap and water. Remove  old dressing, discard into plastic bag and place into trash. Cleanse the wound with Normal Saline prior to applying a clean dressing using gauze sponges, not tissues or cotton balls. Do not scrub or use excessive force. Pat dry using gauze sponges, not tissue or cotton balls. Cleanser: Soap and Water 3 x Per Week/30 Days Discharge Instructions: Gently cleanse wound with antibacterial soap, rinse and pat dry prior to dressing wounds Prim Dressing: Hydrofera Blue Ready Transfer Foam, 2.5x2.5 (in/in) 3 x Per Week/30 Days ary Discharge Instructions: cut to  size of wound Secondary Dressing: Gauze 3 x Per Week/30 Days Discharge Instructions: As directed: dry, moistened with saline or moistened with Dakins Solution Secured With: Medipore T - 26M Medipore H Soft Cloth Surgical T ape ape, 2x2 (in/yd) 3 x Per Week/30 Days WOUND #7: - Sacrum Wound Laterality: Cleanser: Normal Saline 1 x Per Day/30 Days Discharge Instructions: Wash your hands with soap and water. Remove old dressing, discard into plastic bag and place into trash. Cleanse the wound with Normal Saline prior to applying a clean dressing using gauze sponges, not tissues or cotton balls. Do not scrub or use excessive force. Pat dry using gauze sponges, not tissue or cotton balls. Cleanser: Soap and Water 1 x Per Day/30 Days Discharge Instructions: Gently cleanse wound with antibacterial soap, rinse and pat dry prior to dressing wounds Topical: calmoseptine 1 x Per Day/30 Days Discharge Instructions: apply to excoriated areas / periwound Prim Dressing: Hydrofera Blue Ready Transfer Foam, 4x5 (in/in) 1 x Per Day/30 Days ary Discharge Instructions: Apply Hydrofera Blue Ready to wound bed as directed Secondary Dressing: ABD Pad 5x9 (in/in) 1 x Per Day/30 Days Discharge Instructions: Cover with ABD pad 1. I'm gonna recommend that the patient should continue to monitor and be monitored for any signs of infection or worsening. If anything changes, they should contact the office and let me know. 2. I'm going to recommend that he continue with the [facility] for the time being at both locations. 3. He should continue with appropriate offloading and he seems to be doing a really good job. Had to give him some pointers for what to do to keep pressure off of the heel today as well. We will send back for a visit in 3 weeks to see where things stand. Electronic Signature(s) Signed: 06/24/2023 3:46:23 PM By: Allen Derry PA-C Entered By: Allen Derry on 06/24/2023  12:09:23 -------------------------------------------------------------------------------- SuperBill Details Patient Name: Date of Service: [name] [facility] 06/24/2023 Medical Record Number: [id number] Patient Account Number: [id number] Date of Birth/Sex: Treating RN: 12-19-1955 (67 y.o. [name]) [name] Primary Care Provider: Dione Booze Other Clinician: Referring Provider: Treating Provider/Extender: [name] in Treatment: [address], [place] ([id number]) [id number] Page 12 of 12 Diagnosis Coding ICD-10 Codes Code Description L89.154 Pressure ulcer of sacral region, stage 4 L89.613 Pressure ulcer of right heel, stage 3 L97.822 Non-pressure chronic ulcer of other part of left lower leg with fat layer exposed M62.81 Muscle weakness (generalized) F31.9 Bipolar disorder, unspecified E11.622 Type 2 diabetes mellitus with other skin ulcer I10 Essential (primary) hypertension Z79.01 Long term (current) use of anticoagulants 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 Facility Procedures : CPT4 Code: [id number] Description: 11042 - DEB SUBQ TISSUE 20 SQ CM/< ICD-10 Diagnosis Description L89.613 Pressure ulcer of right heel, stage 3 Modifier: Quantity: 1 Physician Procedures : CPT4 Code Description Modifier 11042 11042 - WC PHYS SUBQ TISS 20 SQ CM ICD-10 Diagnosis Description L89.613 Pressure ulcer of right heel, stage  3 Quantity: 1 Electronic Signature(s) Signed: 06/24/2023 3:46:23 PM By: Allen Derry PA-C Entered By: Allen Derry on 06/24/2023 12:09:44

## 2023-07-15 ENCOUNTER — Ambulatory Visit: Payer: Medicare Other | Admitting: Physician Assistant

## 2023-07-18 ENCOUNTER — Inpatient Hospital Stay
Admission: EM | Admit: 2023-07-18 | Discharge: 2023-07-22 | DRG: 698 | Disposition: A | Discharge status: Home Health | Payer: Medicare Other | Attending: Internal Medicine | Admitting: Internal Medicine

## 2023-07-18 ENCOUNTER — Encounter: Payer: Self-pay | Admitting: Urology

## 2023-07-18 ENCOUNTER — Other Ambulatory Visit: Payer: Self-pay

## 2023-07-18 ENCOUNTER — Ambulatory Visit: Payer: Medicare Other | Admitting: Urology

## 2023-07-18 VITALS — BP 117/70 | HR 68 | Temp 97.6°F | Ht 66.0 in | Wt 279.0 lb

## 2023-07-18 DIAGNOSIS — Z933 Colostomy status: Secondary | ICD-10-CM | POA: Diagnosis not present

## 2023-07-18 DIAGNOSIS — Z7901 Long term (current) use of anticoagulants: Secondary | ICD-10-CM

## 2023-07-18 DIAGNOSIS — E871 Hypo-osmolality and hyponatremia: Secondary | ICD-10-CM | POA: Diagnosis present

## 2023-07-18 DIAGNOSIS — I130 Hypertensive heart and chronic kidney disease with heart failure and stage 1 through stage 4 chronic kidney disease, or unspecified chronic kidney disease: Secondary | ICD-10-CM | POA: Diagnosis present

## 2023-07-18 DIAGNOSIS — L89159 Pressure ulcer of sacral region, unspecified stage: Secondary | ICD-10-CM | POA: Diagnosis present

## 2023-07-18 DIAGNOSIS — D631 Anemia in chronic kidney disease: Secondary | ICD-10-CM | POA: Diagnosis present

## 2023-07-18 DIAGNOSIS — F0283 Dementia in other diseases classified elsewhere, unspecified severity, with mood disturbance: Secondary | ICD-10-CM | POA: Diagnosis present

## 2023-07-18 DIAGNOSIS — L89154 Pressure ulcer of sacral region, stage 4: Secondary | ICD-10-CM | POA: Diagnosis present

## 2023-07-18 DIAGNOSIS — Z7984 Long term (current) use of oral hypoglycemic drugs: Secondary | ICD-10-CM | POA: Diagnosis not present

## 2023-07-18 DIAGNOSIS — N390 Urinary tract infection, site not specified: Secondary | ICD-10-CM | POA: Diagnosis present

## 2023-07-18 DIAGNOSIS — N319 Neuromuscular dysfunction of bladder, unspecified: Secondary | ICD-10-CM

## 2023-07-18 DIAGNOSIS — I5032 Chronic diastolic (congestive) heart failure: Secondary | ICD-10-CM | POA: Diagnosis present

## 2023-07-18 DIAGNOSIS — I10 Essential (primary) hypertension: Secondary | ICD-10-CM | POA: Diagnosis present

## 2023-07-18 DIAGNOSIS — N179 Acute kidney failure, unspecified: Secondary | ICD-10-CM | POA: Diagnosis present

## 2023-07-18 DIAGNOSIS — N1832 Chronic kidney disease, stage 3b: Secondary | ICD-10-CM | POA: Diagnosis present

## 2023-07-18 DIAGNOSIS — Z978 Presence of other specified devices: Secondary | ICD-10-CM

## 2023-07-18 DIAGNOSIS — R3989 Other symptoms and signs involving the genitourinary system: Secondary | ICD-10-CM

## 2023-07-18 DIAGNOSIS — E8721 Acute metabolic acidosis: Secondary | ICD-10-CM | POA: Diagnosis present

## 2023-07-18 DIAGNOSIS — Y846 Urinary catheterization as the cause of abnormal reaction of the patient, or of later complication, without mention of misadventure at the time of the procedure: Secondary | ICD-10-CM | POA: Diagnosis present

## 2023-07-18 DIAGNOSIS — G3109 Other frontotemporal dementia: Secondary | ICD-10-CM | POA: Diagnosis present

## 2023-07-18 DIAGNOSIS — E66813 Obesity, class 3: Secondary | ICD-10-CM | POA: Diagnosis present

## 2023-07-18 DIAGNOSIS — F319 Bipolar disorder, unspecified: Secondary | ICD-10-CM | POA: Diagnosis present

## 2023-07-18 DIAGNOSIS — Z87820 Personal history of traumatic brain injury: Secondary | ICD-10-CM

## 2023-07-18 DIAGNOSIS — L89623 Pressure ulcer of left heel, stage 3: Secondary | ICD-10-CM | POA: Diagnosis present

## 2023-07-18 DIAGNOSIS — F0280 Dementia in other diseases classified elsewhere without behavioral disturbance: Secondary | ICD-10-CM | POA: Diagnosis present

## 2023-07-18 DIAGNOSIS — Z818 Family history of other mental and behavioral disorders: Secondary | ICD-10-CM

## 2023-07-18 DIAGNOSIS — I959 Hypotension, unspecified: Secondary | ICD-10-CM | POA: Diagnosis present

## 2023-07-18 DIAGNOSIS — D638 Anemia in other chronic diseases classified elsewhere: Secondary | ICD-10-CM | POA: Diagnosis present

## 2023-07-18 DIAGNOSIS — T83518A Infection and inflammatory reaction due to other urinary catheter, initial encounter: Secondary | ICD-10-CM | POA: Diagnosis present

## 2023-07-18 DIAGNOSIS — Z86718 Personal history of other venous thrombosis and embolism: Secondary | ICD-10-CM

## 2023-07-18 DIAGNOSIS — S069XAA Unspecified intracranial injury with loss of consciousness status unknown, initial encounter: Secondary | ICD-10-CM | POA: Diagnosis present

## 2023-07-18 DIAGNOSIS — E1122 Type 2 diabetes mellitus with diabetic chronic kidney disease: Secondary | ICD-10-CM | POA: Diagnosis present

## 2023-07-18 DIAGNOSIS — Z87440 Personal history of urinary (tract) infections: Secondary | ICD-10-CM

## 2023-07-18 DIAGNOSIS — F0282 Dementia in other diseases classified elsewhere, unspecified severity, with psychotic disturbance: Secondary | ICD-10-CM | POA: Diagnosis present

## 2023-07-18 DIAGNOSIS — F0390 Unspecified dementia without behavioral disturbance: Secondary | ICD-10-CM | POA: Diagnosis present

## 2023-07-18 DIAGNOSIS — E872 Acidosis, unspecified: Secondary | ICD-10-CM | POA: Diagnosis present

## 2023-07-18 DIAGNOSIS — Z79899 Other long term (current) drug therapy: Secondary | ICD-10-CM

## 2023-07-18 DIAGNOSIS — E1129 Type 2 diabetes mellitus with other diabetic kidney complication: Secondary | ICD-10-CM | POA: Diagnosis present

## 2023-07-18 DIAGNOSIS — E875 Hyperkalemia: Secondary | ICD-10-CM | POA: Diagnosis present

## 2023-07-18 LAB — MICROSCOPIC EXAMINATION
Epithelial Cells (non renal): >10 /[HPF] — AB (ref 0–10)
RBC, Urine: >30 /[HPF] — AB (ref 0–2)
WBC, UA: >30 /[HPF] — AB (ref 0–5)

## 2023-07-18 LAB — CBC
HCT: 29 % — ABNORMAL LOW (ref 39.0–52.0)
Hemoglobin: 8.8 g/dL — ABNORMAL LOW (ref 13.0–17.0)
MCH: 27.5 pg (ref 26.0–34.0)
MCHC: 30.3 g/dL (ref 30.0–36.0)
MCV: 90.6 fL (ref 80.0–100.0)
Platelets: 199 10*3/uL (ref 150–400)
RBC: 3.2 MIL/uL — ABNORMAL LOW (ref 4.22–5.81)
RDW: 17.4 % — ABNORMAL HIGH (ref 11.5–15.5)
WBC: 8.3 10*3/uL (ref 4.0–10.5)
nRBC: 0 % (ref 0.0–0.2)

## 2023-07-18 LAB — URINALYSIS, COMPLETE
Bilirubin, UA: NEGATIVE
Glucose, UA: NEGATIVE
Ketones, UA: NEGATIVE
Nitrite, UA: POSITIVE — AB
Specific Gravity, UA: >1.03 — ABNORMAL HIGH (ref 1.005–1.030)
Urobilinogen, Ur: 0.2 mg/dL (ref 0.2–1.0)
pH, UA: 5.5 (ref 5.0–7.5)

## 2023-07-18 LAB — URINALYSIS, ROUTINE W REFLEX MICROSCOPIC
Bilirubin Urine: NEGATIVE
Glucose, UA: NEGATIVE mg/dL
Ketones, ur: NEGATIVE mg/dL
Nitrite: POSITIVE — AB
Protein, ur: 100 mg/dL — AB
Specific Gravity, Urine: 1.015 (ref 1.005–1.030)
pH: 5 (ref 5.0–8.0)

## 2023-07-18 LAB — BASIC METABOLIC PANEL
Anion gap: 7 (ref 5–15)
Anion gap: 7 (ref 5–15)
BUN: 52 mg/dL — ABNORMAL HIGH (ref 8–23)
BUN: 49 mg/dL — ABNORMAL HIGH (ref 8–23)
CO2: 15 mmol/L — ABNORMAL LOW (ref 22–32)
CO2: 16 mmol/L — ABNORMAL LOW (ref 22–32)
Calcium: 8.4 mg/dL — ABNORMAL LOW (ref 8.9–10.3)
Calcium: 8.3 mg/dL — ABNORMAL LOW (ref 8.9–10.3)
Chloride: 108 mmol/L (ref 98–111)
Chloride: 107 mmol/L (ref 98–111)
Creatinine, Ser: 2.55 mg/dL — ABNORMAL HIGH (ref 0.61–1.24)
Creatinine, Ser: 2.39 mg/dL — ABNORMAL HIGH (ref 0.61–1.24)
GFR, Estimated: 27 mL/min — ABNORMAL LOW (ref >60)
GFR, Estimated: 29 mL/min — ABNORMAL LOW (ref >60)
Glucose, Bld: 210 mg/dL — ABNORMAL HIGH (ref 70–99)
Glucose, Bld: 128 mg/dL — ABNORMAL HIGH (ref 70–99)
Potassium: 5.8 mmol/L — ABNORMAL HIGH (ref 3.5–5.1)
Potassium: 5.7 mmol/L — ABNORMAL HIGH (ref 3.5–5.1)
Sodium: 130 mmol/L — ABNORMAL LOW (ref 135–145)
Sodium: 130 mmol/L — ABNORMAL LOW (ref 135–145)

## 2023-07-18 LAB — PROCALCITONIN: Procalcitonin: 0.18 ng/mL

## 2023-07-18 LAB — TYPE AND SCREEN
ABO/RH(D): A POS
Antibody Screen: NEGATIVE

## 2023-07-18 LAB — LACTIC ACID, PLASMA: Lactic Acid, Venous: 1 mmol/L (ref 0.5–1.9)

## 2023-07-18 LAB — CBG MONITORING, ED: Glucose-Capillary: 109 mg/dL — ABNORMAL HIGH (ref 70–99)

## 2023-07-18 MED ORDER — SODIUM CHLORIDE 0.9 % IV SOLN
2.0000 g | INTRAVENOUS | Status: DC
  Administered 2023-07-20 – 2023-07-21 (×3): 2 g via INTRAVENOUS
  Filled 2023-07-18 (×3): qty 20

## 2023-07-18 MED ORDER — ACETAMINOPHEN 325 MG PO TABS: 650.0000 mg | ORAL_TABLET | Freq: Four times a day (QID) | ORAL | Status: DC | PRN

## 2023-07-18 MED ORDER — CEFTRIAXONE SODIUM 1 G IJ SOLR
1.0000 g | Freq: Once | INTRAMUSCULAR | Status: AC
  Administered 2023-07-18: 1 g via INTRAVENOUS
  Filled 2023-07-18: qty 10

## 2023-07-18 MED ORDER — MEMANTINE HCL 5 MG PO TABS
10.0000 mg | ORAL_TABLET | Freq: Two times a day (BID) | ORAL | Status: DC
  Administered 2023-07-18 – 2023-07-22 (×8): 10 mg via ORAL
  Filled 2023-07-18 (×8): qty 2

## 2023-07-18 MED ORDER — ROSUVASTATIN CALCIUM 20 MG PO TABS
20.0000 mg | ORAL_TABLET | Freq: Every day | ORAL | Status: DC
  Administered 2023-07-19 – 2023-07-22 (×4): 20 mg via ORAL
  Filled 2023-07-18 (×4): qty 1

## 2023-07-18 MED ORDER — APIXABAN 5 MG PO TABS
5.0000 mg | ORAL_TABLET | Freq: Two times a day (BID) | ORAL | Status: DC
  Administered 2023-07-18 – 2023-07-22 (×8): 5 mg via ORAL
  Filled 2023-07-18 (×8): qty 1

## 2023-07-18 MED ORDER — ACETAMINOPHEN 650 MG RE SUPP: 650.0000 mg | Freq: Four times a day (QID) | RECTAL | Status: DC | PRN

## 2023-07-18 MED ORDER — ONDANSETRON HCL 4 MG/2ML IJ SOLN: 4.0000 mg | Freq: Four times a day (QID) | INTRAMUSCULAR | Status: DC | PRN

## 2023-07-18 MED ORDER — ONDANSETRON HCL 4 MG PO TABS: 4.0000 mg | ORAL_TABLET | Freq: Four times a day (QID) | ORAL | Status: DC | PRN

## 2023-07-18 MED ORDER — HYDROCODONE-ACETAMINOPHEN 5-325 MG PO TABS
1.0000 | ORAL_TABLET | ORAL | Status: DC | PRN
Start: 2023-07-18 — End: 2023-07-22

## 2023-07-18 MED ORDER — SODIUM CHLORIDE 0.9 % IV BOLUS
1000.0000 mL | Freq: Once | INTRAVENOUS | Status: AC
  Administered 2023-07-18: 1000 mL via INTRAVENOUS

## 2023-07-18 MED ORDER — SODIUM ZIRCONIUM CYCLOSILICATE 10 G PO PACK
10.0000 g | PACK | Freq: Once | ORAL | Status: AC
  Administered 2023-07-18: 10 g via ORAL
  Filled 2023-07-18: qty 1

## 2023-07-18 MED ORDER — OXCARBAZEPINE 300 MG PO TABS
300.0000 mg | ORAL_TABLET | Freq: Two times a day (BID) | ORAL | Status: DC
  Administered 2023-07-18 – 2023-07-22 (×8): 300 mg via ORAL
  Filled 2023-07-18 (×8): qty 1

## 2023-07-18 MED ORDER — QUETIAPINE FUMARATE 25 MG PO TABS: 25.0000 mg | ORAL_TABLET | Freq: Two times a day (BID) | ORAL | Status: DC | PRN

## 2023-07-18 MED ORDER — LACTATED RINGERS IV SOLN
150.0000 mL/h | INTRAVENOUS | Status: AC
  Administered 2023-07-18 – 2023-07-19 (×2): 150 mL/h via INTRAVENOUS

## 2023-07-18 MED ORDER — INSULIN ASPART 100 UNIT/ML IJ SOLN
0.0000 [IU] | Freq: Three times a day (TID) | INTRAMUSCULAR | Status: DC
Start: 2023-07-19 — End: 2023-07-22
  Administered 2023-07-20 – 2023-07-22 (×3): 2 [IU] via SUBCUTANEOUS
  Filled 2023-07-18 (×3): qty 1

## 2023-07-18 MED ORDER — INSULIN ASPART 100 UNIT/ML IJ SOLN
0.0000 [IU] | Freq: Every day | INTRAMUSCULAR | Status: DC
  Filled 2023-07-18: qty 1

## 2023-07-18 NOTE — Assessment & Plan Note (Signed)
-  History of DVT - Continue Eliquis 

## 2023-07-18 NOTE — Assessment & Plan Note (Signed)
Complicating factor to overall prognosis and care 

## 2023-07-18 NOTE — ED Notes (Signed)
Pt arrived into the room with his wife.  He was seated in a wheelchair and this RN attempted to get pt up out of wheelchair and into his bed.  Pt was not able to assist and more staff had to help get pt in bed.   Pt's wife states how tired she is and how unable she is to take care of him at home anymore

## 2023-07-18 NOTE — Assessment & Plan Note (Signed)
Secondary to AKI Received Lokelma in the ED Continue to monitor and correct

## 2023-07-18 NOTE — Progress Notes (Signed)
07/18/2023 12:39 PM   [name] [birth year] [id number]  Referring provider: Dione Booze, MD [address] [place],  [place] [address]  Urological history: 1.  Neurogenic bladder -Since early 2000 secondary to TBI -Managed by indwelling Foley  2. High risk  hematuria  -non-smoker -non contrast CT (01/2023) - mild hydronephrosis -cysto/right retrograde (09/2022) -J hooking of right distal ureter, no tumors and wide open prostatic fossa from the prior TURP  Chief Complaint  Patient presents with   cloudy urine   HPI: [name] is a 68 y.o. male who presents today for possible UTI with his wife, [name].    Previous records reviewed.   History is obtained from his wife.   His wife states that he has been having urine with thick mucous for two weeks associated with hallucinations, weakness, poor food intake and poor fluid intake.  The stool in the colostomy bag has been dark, but his wife feels it may be due to his iron pills.  She denied any gross hematuria.  She cannot get more history from him because he will not endorse symptoms for his fear of being hospitalized.  Patient's wife denies any modifying or aggravating factors.  Patient's wife denies any gross hematuria, dysuria or suprapubic/flank pain.  Patient denies any fevers, chills, nausea or vomiting.    She also states that she has noted an odor coming from his sacral decubitus ulcer.  In the room, he looks listless and pale.  UA yellow cloudy, specific gravity greater than 1.030, 3+ blood, pH 5.5, 3+ protein, nitrite positive, 2+ leukocyte, greater than 30 WBCs, greater than 30 RBCs, greater than 10 epithelial cells, amorphous sediment present, hyaline cast present, crystals present mucus threads present and many bacteria.  Wife states home health came out yesterday to exchange the catheter.  Patient had a CBC on June 19, 2023 which noted a hemoglobin of 8.9 and serum creatinine of  2.48.  These are at baseline.  His potassium was at 5.6.  PMH: Past Medical History:  Diagnosis Date   Acute exacerbation of CHF (congestive heart failure) (HCC)    Anemia    Bipolar disorder (HCC)    Bladder mass    Cellulitis    Chronic anticoagulation    Chronic indwelling Foley catheter    CKD (chronic kidney disease) stage 3, GFR 30-59 ml/min (HCC)    Colostomy in place (HCC)    Diabetes mellitus without complication (HCC)    Frontotemporal dementia (HCC)    Heel ulcer (HCC)    Left   Hematuria    History of blood clots    Hydronephrosis of right kidney    Hyperkalemia    Hypertension    Hyponatremia    Lithium toxicity    Metabolic acidosis    Neurogenic bladder    Obesity    Sacral decubitus ulcer    Sepsis (HCC)    TBI (traumatic brain injury) (HCC)    UTI (urinary tract infection)    VRE (vancomycin resistant enterococcus) culture positive     Surgical History: Past Surgical History:  Procedure Laterality Date   BACK SURGERY     CARPAL TUNNEL RELEASE Bilateral    COLON SURGERY     COLOSTOMY     CYSTOSCOPY W/ RETROGRADES Right 10/25/2022   Procedure: CYSTOSCOPY WITH RETROGRADE PYELOGRAM;  Surgeon: Sondra Come, MD;  Location: [facility] ORS;  Service: Urology;  Laterality: Right;   IR PERC TUN PERIT CATH WO PORT S&I Judi Cong  10/11/2021  IR REMOVAL TUN CV CATH W/O FL  11/19/2021   IR US GUIDE VASC ACCESS RIGHT  10/11/2021   SACRAL DECUBITUS ULCER EXCISION     TONSILLECTOMY      Home Medications:  Allergies as of 07/18/2023   No Known Allergies      Medication List        Accurate as of July 18, 2023 12:39 PM. If you have any questions, ask your nurse or doctor.          STOP taking these medications    furosemide 40 MG tablet Commonly known as: LASIX   sodium bicarbonate 650 MG tablet   sulfamethoxazole-trimethoprim 800-160 MG tablet Commonly known as: BACTRIM DS       TAKE these medications    amLODipine-olmesartan 5-20 MG  tablet Commonly known as: AZOR Take 1 tablet by mouth daily.   cyanocobalamin 1000 MCG tablet Commonly known as: VITAMIN B12 Take 3,000 mcg by mouth daily.   Dexcom G7 Receiver Devi UAD for blood sugar monitoring.   Eliquis 5 MG Tabs tablet Generic drug: apixaban Take 5 mg by mouth 2 (two) times daily.   fish oil-omega-3 fatty acids 1000 MG capsule Take 1 g by mouth 3 (three) times a week.   glipiZIDE 2.5 MG 24 hr tablet Commonly known as: GLUCOTROL XL Take 2.5 mg by mouth daily.   memantine 10 MG tablet Commonly known as: NAMENDA Take 10 mg by mouth 2 (two) times daily.   Oxcarbazepine 300 MG tablet Commonly known as: TRILEPTAL Take 1 tablet (300 mg total) by mouth 2 (two) times daily.   QUEtiapine 25 MG tablet Commonly known as: SEROQUEL TAKE 1 TO 2 TABLETS BY MOUTH ONCE DAILY AS NEEDED What changed: See the new instructions.   rosuvastatin 20 MG tablet Commonly known as: CRESTOR Take 20 mg by mouth at bedtime.   vitamin D3 25 MCG tablet Commonly known as: CHOLECALCIFEROL Take 1 tablet (1,000 Units total) by mouth daily.        Allergies: No Known Allergies  Family History: Family History  Problem Relation Age of Onset   Depression Father    Deep vein thrombosis Father     Social History:  reports that he has never smoked. He has never been exposed to tobacco smoke. He has never used smokeless tobacco. He reports that he does not currently use alcohol. He reports that he does not use drugs.  ROS: Pertinent ROS in HPI  Physical Exam: BP 117/70   Pulse 68   Temp 97.6 F (36.4 C)   Ht 5\' 6"  (1.676 m)   Wt 279 lb (126.6 kg)   SpO2 97%   BMI 45.03 kg/m   Constitutional:  Listless, Pale, Will answer questions HEENT: Roy AT, dry mucus membranes.  Trachea midline, no masses. Cardiovascular: No clubbing, cyanosis, or edema. Respiratory: Normal respiratory effort, no increased work of breathing. Psychiatric: Normal mood and affect.   Laboratory  Data: CBC Order: [id number] Component Ref Range & Units 4 wk ago  WBC 3.8 - 10.8 Thousand/uL 9.1  RBC 4.20 - 5.80 Million/uL 3.37 Low   Hemoglobin 13.2 - 17.1 g/dL 8.9 Low   Hematocrit 56.2 - 50.0 % 30.1 Low   MCV 80.0 - 100.0 fL 89.3  MCH 27.0 - 33.0 pg 26.4 Low   MCHC 32.0 - 36.0 g/dL 13.0 Low   Comment:      For adults, a slight decrease in the calculated MCHC      value (in the range  of 30 to 32 g/dL) is most likely      not clinically significant; however, it should be      interpreted with caution in correlation with other      red cell parameters and the patient's clinical      condition.  RDW 11.0 - 15.0 % 16.9 High   Platelets 140 - 400 Thousand/uL 256  MPV 7.5 - 12.5 fL 11  Resulting Agency See order comments   Specimen Collected: 06/19/23 09:30   Performed by: Chancy Hurter Last Resulted: 06/20/23 11:10  Received From: [facility]  Result Received: 06/24/23 08:45   Renal Function Panel Order: [id number] Component Ref Range & Units 4 wk ago  Glucose 65 - 99 mg/dL 98  Comment:                                                                                             Fasting reference interval                                           BUN 7 - 25 mg/dL 45 High   Creatinine 9.52 - 1.35 mg/dL 8.41 High   eGFR CKD-EPI CR 2021 > OR = 60 mL/min/1.85m2 28 Low   BUN/Creatinine Ratio 6 - 22 (calc) 18  Sodium 135 - 146 mmol/L 136  Potassium 3.5 - 5.3 mmol/L 5.6 High   Chloride 98 - 110 mmol/L 109  Bicarbonate (CO2) 20 - 32 mmol/L 19 Low   Calcium 8.6 - 10.3 mg/dL 8.8  Phosphorus 2.1 - 4.3 mg/dL 3.5  Albumin 3.6 - 5.1 g/dL 3.2 Low   Resulting Agency See order comments   Specimen Collected: 06/19/23 09:30   Performed by: Chancy Hurter Last Resulted: 06/20/23 11:10  Received From: [facility]  Result Received: 06/24/23 08:45   Urinalysis See HPI and EPIC I have reviewed the labs.   Pertinent Imaging: N/A   Assessment & Plan:     1. Suspected UTI -UA grossly infected  -Urine culture pending -Have transferred the patient to the emergency department for further evaluation, I am concerned he may have other issues in addition to the urinary tract infection that would make him unsafe to be sent home on outpatient medication especially when he has not been eating or consuming fluids at home.  I am also concerned there may be some electrolyte disturbances and he may also need a blood transfusion as he appeared very pale in clinic today  2. Neurogenic bladder -Foley replaced yesterday by home health   Return for to the ED for further evaluation .  These notes generated with voice recognition software. I apologize for typographical errors.  Cloretta Ned  [facility] [address]  [address] [place], [place] [address] [phone]

## 2023-07-18 NOTE — Assessment & Plan Note (Addendum)
Ostomy care

## 2023-07-18 NOTE — Assessment & Plan Note (Signed)
Sliding scale insulin coverage Hold home glipizide

## 2023-07-18 NOTE — Assessment & Plan Note (Addendum)
Acute metabolic acidosis Suspect ATN related to dehydration from poor oral intake related to acute illness.  Meeting sepsis criteria IV hydration Monitor renal function and avoid nephrotoxins

## 2023-07-18 NOTE — Assessment & Plan Note (Addendum)
History of essential hypertension BP 97/53 on arrival, possibly related to poor oral intake over the past 2 weeks versus sepsis Follow lactic acid and procalcitonin IV hydration Hold home antihypertensives/amlodipine

## 2023-07-18 NOTE — ED Triage Notes (Signed)
Pt here with weakness. Pt was sent over from [facility] after exchanging his foley catheter. Pt has a possible UTI. Pt also states he has been weak for the past few days. Pt denies CP or SOB. Pt stable in triage.

## 2023-07-18 NOTE — ED Provider Triage Note (Signed)
Emergency Medicine Provider Triage Evaluation Note  [name] , a 68 y.o. male  was evaluated in triage.  Pt complains of progressive generalized weakness sent over by [facility] clinic today for evaluation of possible UTI   Review of Systems  Positive:  Negative:   Physical Exam  BP (!) 97/53   Pulse 85   Temp 97.8 F (36.6 C) (Oral)   Resp 17   Ht 5\' 6"  (1.676 m)   Wt 126.6 kg   SpO2 100%   BMI 45.05 kg/m  Gen:   Awake, no distress ; Pale skin color Resp:  Normal effort; RRR  MSK:   Moves extremities with difficulty  Other:  Foley catheter in place. Left side colostomy bag in place.   Medical Decision Making  Medically screening exam initiated at 12:53 PM.  Appropriate orders placed.  [name] was informed that the remainder of the evaluation will be completed by another provider, this initial triage assessment does not replace that evaluation, and the importance of remaining in the ED until their evaluation is complete.    Romeo Apple, Kahmya Pinkham A, PA-C 07/18/23 1256

## 2023-07-18 NOTE — Assessment & Plan Note (Signed)
 Stable at 8.8

## 2023-07-18 NOTE — Assessment & Plan Note (Signed)
Chronic and stable Has used salt tablets in the past

## 2023-07-18 NOTE — Assessment & Plan Note (Addendum)
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

## 2023-07-18 NOTE — Assessment & Plan Note (Addendum)
Chronic indwelling Foley catheter secondary to neurogenic bladder Frequent UTIs Patient symptomatic for weakness, poor oral intake, hallucinations S/p catheter exchange 1/16 by home health.  Urology visit on 1/17.  Urinalysis abnormal Hypotensive but otherwise not meeting sepsis criteria at this time but will monitor closely given past history Rocephin based on prior sensitivities: Enterococcus 04/2023 and Klebsiella 01/2023 Follow cultures Will get lactic acid and procalcitonin to help evaluate for sepsis

## 2023-07-18 NOTE — Assessment & Plan Note (Addendum)
Bipolar mood disorder History of traumatic brain injury Continue quetiapine, Trileptal and Namenda

## 2023-07-18 NOTE — ED Notes (Signed)
Pt oriented to room several times.  He has remote and call bell close by and continues to ask where the items are and makes little to no attempt to reach items himself.  Pt's foley is draining without issue and pt has colostomy bag that has minimal drainage.

## 2023-07-18 NOTE — H&P (Signed)
History and Physical    [name] ONG:[id number] DOB: 07-30-1955 DOA: 07/18/2023 DOS: the patient was seen and examined on 07/18/2023 PCP: Dione Booze, MD  Patient coming from: Home  Chief Complaint:  Chief Complaint  Patient presents with   Weakness    HPI: [name] is a 68 y.o. 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  Patient had catheter exchanged by home health on 1/16.  At the urology appointment earlier in the day, patient's wife reported symptoms of weakness, poor oral intake, hallucinations and mucus-like secretion in urine-ongoing for 2 weeks.  Also has a odor from his sacral decubitus.  Urinalysis done at the office was abnormal.  Patient denies abdominal pain, fever or chills. ED course and data review: Hypotensive to 97/53 on arrival with otherwise normal vitals. WBC normal at 8.3, lactic acid not done.  Urinalysis with positive nitrites, moderate leukocyte esterase and few bacteria. Other labs pertinent for creatinine of 2.55 slightly above baseline of 2.2 to, with bicarb of 15 and potassium of 5.8.  Sodium 130.  Hemoglobin was at baseline at 8.8. EKG, post reviewed and interpreted showed NSR at 79 No imaging done  Patient treated with an NS bolus, given a dose of Lokelma for hyperkalemia, started on ceftriaxone.  Hospitalist consulted for admission.    Past Medical History:  Diagnosis Date   Acute exacerbation of CHF (congestive heart failure)  (HCC)    Anemia    Bipolar disorder (HCC)    Bladder mass    Cellulitis    Chronic anticoagulation    Chronic indwelling Foley catheter    CKD (chronic kidney disease) stage 3, GFR 30-59 ml/min (HCC)    Colostomy in place (HCC)    Diabetes mellitus without complication (HCC)    Frontotemporal dementia (HCC)    Heel ulcer (HCC)    Left   Hematuria    History of blood clots    Hydronephrosis of right kidney    Hyperkalemia    Hypertension    Hyponatremia    Lithium toxicity    Metabolic acidosis    Neurogenic bladder    Obesity    Sacral decubitus ulcer    Sepsis (HCC)    TBI (traumatic brain injury) (HCC)    UTI (urinary tract infection)    VRE (vancomycin resistant enterococcus) culture positive    Past Surgical History:  Procedure Laterality Date   BACK SURGERY     CARPAL TUNNEL RELEASE Bilateral    COLON SURGERY     COLOSTOMY     CYSTOSCOPY W/ RETROGRADES Right 10/25/2022   Procedure: CYSTOSCOPY WITH RETROGRADE PYELOGRAM;  Surgeon: Sondra Come, MD;  Location: [facility] ORS;  Service: Urology;  Laterality: Right;   IR PERC TUN PERIT CATH WO PORT S&I /IMAG  10/11/2021   IR REMOVAL TUN CV CATH W/O FL  11/19/2021   IR US GUIDE VASC ACCESS RIGHT  10/11/2021   SACRAL DECUBITUS ULCER EXCISION     TONSILLECTOMY     Social History:  reports that he has never smoked. He has never  been exposed to tobacco smoke. He has never used smokeless tobacco. He reports that he does not currently use alcohol. He reports that he does not use drugs.  No Known Allergies  Family History  Problem Relation Age of Onset   Depression Father    Deep vein thrombosis Father     Prior to Admission medications   Medication Sig Start Date End Date Taking? Authorizing Provider  amLODipine-olmesartan (AZOR) 5-20 MG tablet Take 1 tablet by mouth daily.   Yes [provider]  ELIQUIS 5 MG TABS tablet Take 5 mg by mouth 2 (two) times daily.  11/17/18  Yes [provider]  glipiZIDE  (GLUCOTROL XL) 2.5 MG 24 hr tablet Take 2.5 mg by mouth daily. 02/04/23  Yes [provider]  memantine (NAMENDA) 10 MG tablet Take 10 mg by mouth 2 (two) times daily.   Yes [provider]  Oxcarbazepine (TRILEPTAL) 300 MG tablet Take 300 mg by mouth 2 (two) times daily.   Yes [provider]  QUEtiapine (SEROQUEL) 25 MG tablet TAKE 1 TO 2 TABLETS BY MOUTH ONCE DAILY AS NEEDED Patient taking differently: Take 25 mg by mouth 2 (two) times daily as needed (agitation). 02/23/23  Yes [name], MD  rosuvastatin (CRESTOR) 20 MG tablet Take 20 mg by mouth at bedtime. 08/29/17  Yes [provider]  vitamin B-12 (CYANOCOBALAMIN) 1000 MCG tablet Take 1,000 mcg by mouth daily.   Yes [provider]  Continuous Blood Gluc Receiver (DEXCOM G7 RECEIVER) DEVI UAD for blood sugar monitoring. 01/23/22   [provider]    Physical Exam: Vitals:   07/18/23 1900 07/18/23 1909 07/18/23 1930 07/18/23 2000  BP: 107/60  (!) 92/59 (!) 115/51  Pulse: 74  72 71  Resp: 15  16 16   Temp:      TempSrc:      SpO2: 99%  100% 100%  Weight:  119.6 kg    Height:  5\' 6"  (1.676 m)     Physical Exam Vitals and nursing note reviewed.  Constitutional:      General: He is not in acute distress. HENT:     Head: Normocephalic and atraumatic.  Cardiovascular:     Rate and Rhythm: Normal rate and regular rhythm.     Heart sounds: Normal heart sounds.  Pulmonary:     Effort: Pulmonary effort is normal.     Breath sounds: Normal breath sounds.  Abdominal:     Palpations: Abdomen is soft.     Tenderness: There is no abdominal tenderness.  Neurological:     Mental Status: Mental status is at baseline.     Labs on Admission: I have personally reviewed following labs and imaging studies  CBC: Recent Labs  Lab 07/18/23 1300  WBC 8.3  HGB 8.8*  HCT 29.0*  MCV 90.6  PLT 199   Basic Metabolic Panel: Recent Labs  Lab 07/18/23 1300  NA 130*  K 5.8*  CL 108   CO2 15*  GLUCOSE 210*  BUN 52*  CREATININE 2.55*  CALCIUM 8.4*   GFR: Estimated Creatinine Clearance: 33.8 mL/min (A) (by C-G formula based on SCr of 2.55 mg/dL (H)). Liver Function Tests: No results for input(s): "AST", "ALT", "ALKPHOS", "BILITOT", "PROT", "ALBUMIN" in the last 168 hours. No results for input(s): "LIPASE", "AMYLASE" in the last 168 hours. No results for input(s): "AMMONIA" in the last 168 hours. Coagulation Profile: No results for input(s): "INR", "PROTIME" in the last 168 hours. Cardiac Enzymes: No results for  input(s): "CKTOTAL", "CKMB", "CKMBINDEX", "TROPONINI" in the last 168 hours. BNP (last 3 results) No results for input(s): "PROBNP" in the last 8760 hours. HbA1C: No results for input(s): "HGBA1C" in the last 72 hours. CBG: No results for input(s): "GLUCAP" in the last 168 hours. Lipid Profile: No results for input(s): "CHOL", "HDL", "LDLCALC", "TRIG", "CHOLHDL", "LDLDIRECT" in the last 72 hours. Thyroid Function Tests: No results for input(s): "TSH", "T4TOTAL", "FREET4", "T3FREE", "THYROIDAB" in the last 72 hours. Anemia Panel: No results for input(s): "VITAMINB12", "FOLATE", "FERRITIN", "TIBC", "IRON", "RETICCTPCT" in the last 72 hours. Urine analysis:    Component Value Date/Time   COLORURINE YELLOW (A) 07/18/2023 1851   APPEARANCEUR HAZY (A) 07/18/2023 1851   APPEARANCEUR Cloudy (A) 07/18/2023 1211   LABSPEC 1.015 07/18/2023 1851   PHURINE 5.0 07/18/2023 1851   GLUCOSEU NEGATIVE 07/18/2023 1851   HGBUR LARGE (A) 07/18/2023 1851   BILIRUBINUR NEGATIVE 07/18/2023 1851   BILIRUBINUR Negative 07/18/2023 1211   KETONESUR NEGATIVE 07/18/2023 1851   PROTEINUR 100 (A) 07/18/2023 1851   NITRITE POSITIVE (A) 07/18/2023 1851   LEUKOCYTESUR MODERATE (A) 07/18/2023 1851    Radiological Exams on Admission: No results found.   Data Reviewed: Relevant notes from primary care and specialist visits, past discharge summaries as available in EHR,  including Care Everywhere. Prior diagnostic testing as pertinent to current admission diagnoses Updated medications and problem lists for reconciliation ED course, including vitals, labs, imaging, treatment and response to treatment Triage notes, nursing and pharmacy notes and ED provider's notes Notable results as noted in HPI   Assessment and Plan: * Complicated UTI (urinary tract infection) Chronic indwelling Foley catheter secondary to neurogenic bladder Frequent UTIs Patient symptomatic for weakness, poor oral intake, hallucinations S/p catheter exchange 1/16 by home health.  Urology visit on 1/17.  Urinalysis abnormal Hypotensive but otherwise not meeting sepsis criteria at this time but will monitor closely given past history Rocephin based on prior sensitivities: Enterococcus 04/2023 and Klebsiella 01/2023 Follow cultures Will get lactic acid and procalcitonin to help evaluate for sepsis  Hypotension History of essential hypertension BP 97/53 on arrival, possibly related to poor oral intake over the past 2 weeks versus sepsis Follow lactic acid and procalcitonin IV hydration Hold home antihypertensives/amlodipine  Hyperkalemia Secondary to AKI Received Lokelma in the ED Continue to monitor and correct  Acute renal failure superimposed on stage 3b chronic kidney disease (HCC) Acute metabolic acidosis Suspect ATN related to dehydration from poor oral intake related to acute illness.  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 Wound care consult Wife had reported foul order  Hyponatremia Chronic and stable Has used salt tablets in the past  Type II diabetes mellitus with renal manifestations (HCC) Sliding scale insulin coverage Hold home glipizide  Chronic anticoagulation - on Eliquis History of DVT Continue Eliquis  Chronic heart  failure with preserved ejection fraction (HFpEF, >= 50%) (HCC) Clinically dry Monitor for fluid overload in view of IV fluid resuscitation Last echo April 2023 showed EF 60 to 65% and G3 DD  Colostomy in place [facility]) Ostomy care  Anemia of chronic disease Stable at 8.8  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 Inpatient status is judged to be reasonable and necessary in order to provide the required intensity of service to ensure the patient's safety. The patient's presenting symptoms, physical exam findings, and initial radiographic and laboratory data in the context of their chronic comorbidities is felt to place them at high risk for further clinical deterioration. Furthermore, it is not anticipated that the patient will be medically stable for discharge from the hospital within 2 midnights of admission.   * I certify that at the point of admission it is my clinical judgment that the patient will require inpatient hospital care spanning beyond 2 midnights from the point of admission due to high intensity of service, high risk for further deterioration and high frequency of surveillance required.*  Author: Andris Baumann, MD 07/18/2023 8:36 PM  For on call review [email or web address].

## 2023-07-18 NOTE — Assessment & Plan Note (Signed)
Clinically dry Monitor for fluid overload in view of IV fluid resuscitation Last echo April 2023 showed EF 60 to 65% and G3 DD

## 2023-07-18 NOTE — ED Provider Notes (Signed)
[facility] Provider Note    Event Date/Time   First MD Initiated Contact with Patient 07/18/23 1750     (approximate)   History   Weakness   HPI  [name] is a 68 y.o. male who presents to the emerged [place] department today because of concerns for urinary tract infection.  Patient was sent from urology clinic.  He was here for a scheduled Foley catheter exchange.  He states he has noticed some increased haziness of his urine recently.  He denies any fevers.  Denies abdominal pain.  States he has history of UTIs.     Physical Exam   Triage Vital Signs: ED Triage Vitals [07/18/23 1246]  Encounter Vitals Group     BP (!) 97/53     Systolic BP Percentile      Diastolic BP Percentile      Pulse Rate 85     Resp 17     Temp 97.8 F (36.6 C)     Temp Source Oral     SpO2 100 %     Weight 279 lb 1.6 oz (126.6 kg)     Height 5\' 6"  (1.676 m)     Head Circumference      Peak Flow      Pain Score 0     Pain Loc      Pain Education      Exclude from Growth Chart     Most recent vital signs: Vitals:   07/18/23 1745 07/18/23 1747  BP:  121/78  Pulse: 79   Resp: 18   Temp:    SpO2: 100%    General: Awake, alert, oriented. CV:  Good peripheral perfusion.  Resp:  Normal effort.  Abd:  No distention.    ED Results / Procedures / Treatments   Labs (all labs ordered are listed, but only abnormal results are displayed) Labs Reviewed  BASIC METABOLIC PANEL - Abnormal; Notable for the following components:      Result Value   Sodium 130 (*)    Potassium 5.8 (*)    CO2 15 (*)    Glucose, Bld 210 (*)    BUN 52 (*)    Creatinine, Ser 2.55 (*)    Calcium 8.4 (*)    GFR, Estimated 27 (*)    All other components within normal limits  CBC - Abnormal; Notable for the following components:   RBC 3.20 (*)    Hemoglobin 8.8 (*)    HCT 29.0 (*)    RDW 17.4 (*)    All other components within normal limits  URINALYSIS, ROUTINE W REFLEX  MICROSCOPIC  CBG MONITORING, ED  TYPE AND SCREEN     EKG  I, [name], attending physician, personally viewed and interpreted this EKG  EKG Time: 1252 Rate: 79 Rhythm: normal sinus rhythm Axis: normal Intervals: qtc 385 QRS: narrow ST changes: no st elevation Impression: normal ekg   RADIOLOGY None   PROCEDURES:  Critical Care performed: No   MEDICATIONS ORDERED IN ED: Medications - No data to display   IMPRESSION / MDM / ASSESSMENT AND PLAN / ED COURSE  I reviewed the triage vital signs and the nursing notes.                              Differential diagnosis includes, but is not limited to, UTI, dehydration  Patient's presentation is most consistent with acute presentation with potential  threat to life or bodily function.   Patient presents to the emergency department after being sent from urology clinic because of concerns for urinary tract infection.  Initial blood pressure here was low.  Patient without leukocytosis and his blood work.  He did however raise signs concerning for some dehydration with elevated potassium and creatinine.  Will give IV fluids and Lokelma.  Will give dose of IV antibiotics.  Discussed with Dr. Para March with the hospitalist service who will evaluate for admission.    FINAL CLINICAL IMPRESSION(S) / ED DIAGNOSES   Final diagnoses:  Lower urinary tract infectious disease  Hyperkalemia    Note:  This document was prepared using Dragon voice recognition software and may include unintentional dictation errors.    [name], MD 07/18/23 Jerene Bears

## 2023-07-19 ENCOUNTER — Encounter: Payer: Self-pay | Admitting: Internal Medicine

## 2023-07-19 DIAGNOSIS — N390 Urinary tract infection, site not specified: Secondary | ICD-10-CM | POA: Diagnosis not present

## 2023-07-19 LAB — BASIC METABOLIC PANEL
Anion gap: 8 (ref 5–15)
BUN: 46 mg/dL — ABNORMAL HIGH (ref 8–23)
CO2: 16 mmol/L — ABNORMAL LOW (ref 22–32)
Calcium: 8.1 mg/dL — ABNORMAL LOW (ref 8.9–10.3)
Chloride: 108 mmol/L (ref 98–111)
Creatinine, Ser: 2.43 mg/dL — ABNORMAL HIGH (ref 0.61–1.24)
GFR, Estimated: 28 mL/min — ABNORMAL LOW (ref >60)
Glucose, Bld: 140 mg/dL — ABNORMAL HIGH (ref 70–99)
Potassium: 4.9 mmol/L (ref 3.5–5.1)
Sodium: 132 mmol/L — ABNORMAL LOW (ref 135–145)

## 2023-07-19 LAB — CBC
HCT: 25 % — ABNORMAL LOW (ref 39.0–52.0)
Hemoglobin: 7.9 g/dL — ABNORMAL LOW (ref 13.0–17.0)
MCH: 27.8 pg (ref 26.0–34.0)
MCHC: 31.6 g/dL (ref 30.0–36.0)
MCV: 88 fL (ref 80.0–100.0)
Platelets: 172 10*3/uL (ref 150–400)
RBC: 2.84 MIL/uL — ABNORMAL LOW (ref 4.22–5.81)
RDW: 17.2 % — ABNORMAL HIGH (ref 11.5–15.5)
WBC: 9.2 10*3/uL (ref 4.0–10.5)
nRBC: 0 % (ref 0.0–0.2)

## 2023-07-19 LAB — LACTIC ACID, PLASMA: Lactic Acid, Venous: 0.9 mmol/L (ref 0.5–1.9)

## 2023-07-19 LAB — CBG MONITORING, ED
Glucose-Capillary: 91 mg/dL (ref 70–99)
Glucose-Capillary: 95 mg/dL (ref 70–99)
Glucose-Capillary: 106 mg/dL — ABNORMAL HIGH (ref 70–99)

## 2023-07-19 LAB — HEMOGLOBIN A1C
Hgb A1c MFr Bld: 5.9 % — ABNORMAL HIGH (ref 4.8–5.6)
Mean Plasma Glucose: 122.63 mg/dL

## 2023-07-19 MED ORDER — CHLORHEXIDINE GLUCONATE CLOTH 2 % EX PADS
6.0000 | MEDICATED_PAD | Freq: Every day | CUTANEOUS | Status: DC
  Administered 2023-07-20 – 2023-07-22 (×3): 6 via TOPICAL

## 2023-07-19 NOTE — ED Notes (Signed)
Pt requesting his meds be given once he eats lunch.

## 2023-07-19 NOTE — ED Notes (Signed)
This tech notified RN, Morrie Sheldon of pt's CBG of 106 mg/dL

## 2023-07-19 NOTE — ED Notes (Signed)
This tech assisted pt with feeding per the pt's request. Pt ate 50% of meal.

## 2023-07-20 DIAGNOSIS — N390 Urinary tract infection, site not specified: Secondary | ICD-10-CM | POA: Diagnosis not present

## 2023-07-20 LAB — GLUCOSE, CAPILLARY
Glucose-Capillary: 108 mg/dL — ABNORMAL HIGH (ref 70–99)
Glucose-Capillary: 123 mg/dL — ABNORMAL HIGH (ref 70–99)
Glucose-Capillary: 128 mg/dL — ABNORMAL HIGH (ref 70–99)
Glucose-Capillary: 133 mg/dL — ABNORMAL HIGH (ref 70–99)
Glucose-Capillary: 134 mg/dL — ABNORMAL HIGH (ref 70–99)

## 2023-07-20 LAB — CBC WITH DIFFERENTIAL/PLATELET
Abs Immature Granulocytes: 0.04 10*3/uL (ref 0.00–0.07)
Basophils Absolute: 0 10*3/uL (ref 0.0–0.1)
Basophils Relative: 0 %
Eosinophils Absolute: 0.5 10*3/uL (ref 0.0–0.5)
Eosinophils Relative: 5 %
HCT: 25.6 % — ABNORMAL LOW (ref 39.0–52.0)
Hemoglobin: 8.2 g/dL — ABNORMAL LOW (ref 13.0–17.0)
Immature Granulocytes: 0 %
Lymphocytes Relative: 12 %
Lymphs Abs: 1.1 10*3/uL (ref 0.7–4.0)
MCH: 27.4 pg (ref 26.0–34.0)
MCHC: 32 g/dL (ref 30.0–36.0)
MCV: 85.6 fL (ref 80.0–100.0)
Monocytes Absolute: 0.9 10*3/uL (ref 0.1–1.0)
Monocytes Relative: 9 %
Neutro Abs: 6.8 10*3/uL (ref 1.7–7.7)
Neutrophils Relative %: 74 %
Platelets: 193 10*3/uL (ref 150–400)
RBC: 2.99 MIL/uL — ABNORMAL LOW (ref 4.22–5.81)
RDW: 17.2 % — ABNORMAL HIGH (ref 11.5–15.5)
WBC: 9.3 10*3/uL (ref 4.0–10.5)
nRBC: 0 % (ref 0.0–0.2)

## 2023-07-20 LAB — BASIC METABOLIC PANEL
Anion gap: 9 (ref 5–15)
BUN: 40 mg/dL — ABNORMAL HIGH (ref 8–23)
CO2: 16 mmol/L — ABNORMAL LOW (ref 22–32)
Calcium: 8.2 mg/dL — ABNORMAL LOW (ref 8.9–10.3)
Chloride: 106 mmol/L (ref 98–111)
Creatinine, Ser: 2.35 mg/dL — ABNORMAL HIGH (ref 0.61–1.24)
GFR, Estimated: 29 mL/min — ABNORMAL LOW (ref >60)
Glucose, Bld: 143 mg/dL — ABNORMAL HIGH (ref 70–99)
Potassium: 4.9 mmol/L (ref 3.5–5.1)
Sodium: 131 mmol/L — ABNORMAL LOW (ref 135–145)

## 2023-07-20 NOTE — Plan of Care (Signed)

## 2023-07-20 NOTE — Progress Notes (Addendum)
Progress Note   [name] XBJ:[id number] DOB: 1955-07-28 DOA: 07/18/2023     2 DOS: the patient was seen and examined on 07/20/2023     Brief hospital course: From HPI "[name] is a 67 y.o. 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  Patient had catheter exchanged by home health on 1/16.  At the urology appointment earlier in the day, patient's wife reported symptoms of weakness, poor oral intake, hallucinations and mucus-like secretion in urine-ongoing for 2 weeks.  Also has a odor from his sacral decubitus.  Urinalysis done at the office was abnormal.  "     Assessment and Plan   Complicated UTI (urinary tract infection) Chronic indwelling Foley catheter secondary to neurogenic bladder Frequent UTIs Patient symptomatic for weakness, poor oral intake, hallucinations S/p catheter exchange 1/16 by home health.  Urology visit on 1/17.   Urinalysis showing findings of UTI Continue ceftriaxone Procalcitonin 0.18 and lactic acid 1.0   Hypotension History of essential hypertension BP 97/53 on arrival, possibly related to poor oral intake over the past 2 weeks versus sepsis Continue to follow Lactic acid and procalcitonin Continue IV fluid Hold home antihypertensives/amlodipine   Hyperkalemia-resolved Continue to monitor electrolytes closely   Acute renal failure superimposed on stage 3b chronic kidney disease (HCC) Acute metabolic acidosis Suspect ATN related to  dehydration from poor oral intake related to acute illness.  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 Continue wound care   Hyponatremia Chronic and stable Has used salt tablets in the past   Type II diabetes mellitus with renal manifestations (HCC) Continue insulin therapy Monitor glucose closely Hold home glipizide   Chronic anticoagulation - on Eliquis History of DVT Continue Eliquis   Chronic heart failure with preserved ejection fraction (HFpEF, >= 50%) (HCC) Patient was trialed on admission and received IV fluid resuscitation Last echo April 2023 showed EF 60 to 65% and G3 DD   Colostomy in place [facility]) Continue ostomy care   Anemia of chronic disease Continue CBC monitoring   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  Constitutional:      General: He is not in acute distress. HENT:     Head: Normocephalic and atraumatic.  Cardiovascular:     Rate and Rhythm: Normal rate and regular rhythm.     Heart sounds: Normal heart sounds.  Pulmonary:  Effort: Pulmonary effort is normal.     Breath sounds: Normal breath sounds.  Abdominal: Colostomy in place as well as Foley catheter    Palpations: Abdomen is  soft.     Tenderness: There is no abdominal tenderness.  Neurological:     Mental Status: Mental status is at baseline.    Data reviewed:      Latest Ref Rng & Units 07/20/2023    4:21 AM 07/19/2023    4:35 AM 07/18/2023    8:33 PM  BMP  Glucose 70 - 99 mg/dL 161  096  045   BUN 8 - 23 mg/dL 40  46  49   Creatinine 0.61 - 1.24 mg/dL 4.09  8.11  9.14   Sodium 135 - 145 mmol/L 131  132  130   Potassium 3.5 - 5.1 mmol/L 4.9  4.9  5.7   Chloride 98 - 111 mmol/L 106  108  107   CO2 22 - 32 mmol/L 16  16  16    Calcium 8.9 - 10.3 mg/dL 8.2  8.1  8.3     Vitals:   07/19/23 1800 07/19/23 1822 07/20/23 0350 07/20/23 0802  BP: (!) 104/55 (!) 145/80 124/66 121/62  Pulse: 76 79 63 (!) 57  Resp: 17 18 16 16   Temp:  99.3 F (37.4 C) 98.3 F (36.8 C) 98.3 F (36.8 C)  TempSrc:      SpO2: 100% 98% 99%   Weight:      Height:          Latest Ref Rng & Units 07/20/2023    4:21 AM 07/19/2023    4:35 AM 07/18/2023    1:00 PM  CBC  WBC 4.0 - 10.5 K/uL 9.3  9.2  8.3   Hemoglobin 13.0 - 17.0 g/dL 8.2  7.9  8.8   Hematocrit 39.0 - 52.0 % 25.6  25.0  29.0   Platelets 150 - 400 K/uL 193  172  199     Author: Loyce Dys, MD 07/20/2023 4:42 PM  For on call review [email or web address].

## 2023-07-20 NOTE — Progress Notes (Addendum)
Progress Note    Late entry   [name] [id number]:[id number] DOB: Aug 06, 1955 DOA: 07/18/2023     2 DOS: the patient was seen and examined on 07/19/2023   Brief hospital course: From HPI "[name] is a 68 y.o. 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  Patient had catheter exchanged by home health on 1/16.  At the urology appointment earlier in the day, patient's wife reported symptoms of weakness, poor oral intake, hallucinations and mucus-like secretion in urine-ongoing for 2 weeks.  Also has a odor from his sacral decubitus.  Urinalysis done at the office was abnormal.  "   Assessment and Plan  Complicated UTI (urinary tract infection) Chronic indwelling Foley catheter secondary to neurogenic bladder Frequent UTIs Patient symptomatic for weakness, poor oral intake, hallucinations S/p catheter exchange 1/16 by home health.  Urology visit on 1/17.   Urinalysis showing findings of UTI Hypotensive but otherwise not meeting sepsis criteria at this time but will monitor closely given past history Placed on Rocephin based on prior sensitivities: Enterococcus 04/2023 and Klebsiella 01/2023 Follow-up on lactic acid and Pro-Cal   Hypotension History of essential hypertension BP 97/53 on arrival, possibly related to poor oral intake over the past 2 weeks versus sepsis Continue to follow Lactic acid and procalcitonin Continue IV fluid Hold home antihypertensives/amlodipine    Hyperkalemia Secondary to AKI Received Lokelma in the ED Continue to monitor and correct   Acute renal failure superimposed on stage 3b chronic kidney disease (HCC) Acute metabolic acidosis Suspect ATN related to dehydration from poor oral intake related to acute illness.  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 Wound care consulted we appreciate input   Hyponatremia Chronic and stable Has used salt tablets in the past   Type II diabetes mellitus with renal manifestations (HCC) Continue insulin therapy Monitor glucose closely Hold home glipizide   Chronic anticoagulation - on Eliquis History of DVT Continue Eliquis   Chronic heart failure with preserved ejection fraction (HFpEF, >= 50%) (HCC) Patient was trialed on admission and received IV fluid resuscitation Last echo April 2023 showed EF 60 to 65% and G3 DD   Colostomy in place [facility]) Continue ostomy care   Anemia of chronic disease Continue CBC monitoring   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) Bipolar mood disorder History of traumatic brain injury Continue quetiapine, Trileptal and Namenda   Obesity, Class III, BMI 40-49.9 (morbid obesity) (HCC) Complicating factor to overall prognosis and care       DVT prophylaxis: Eliquis   Consults: none   Advance Care Planning:   Code Status: Prior    Family Communication: none   Disposition Plan: Back to previous home environment  Subjective:   Patient seen and examined at bedside this morning Denies nausea vomiting abdominal pain chest pain Admitted overnight with catheter associated UTI  Physical Exam: Vitals and nursing note reviewed.  Constitutional:      General: He is not in acute distress. HENT:  Head: Normocephalic and atraumatic.  Cardiovascular:     Rate and Rhythm: Normal rate and regular  rhythm.     Heart sounds: Normal heart sounds.  Pulmonary:     Effort: Pulmonary effort is normal.     Breath sounds: Normal breath sounds.  Abdominal: Colostomy in place as well as Foley catheter    Palpations: Abdomen is soft.     Tenderness: There is no abdominal tenderness.  Neurological:     Mental Status: Mental status is at baseline.    Data reviewed: I reviewed admitting physicians documentation, reviewed BMP as well as CMP showing worsening of renal function, I reviewed patient's previous imaging on records obtained on 02/27/2023  Vitals:   07/19/23 1800 07/19/23 1822 07/20/23 0350 07/20/23 0802  BP: (!) 104/55 (!) 145/80 124/66 121/62  Pulse: 76 79 63 (!) 57  Resp: 17 18 16 16   Temp:  99.3 F (37.4 C) 98.3 F (36.8 C) 98.3 F (36.8 C)  TempSrc:      SpO2: 100% 98% 99%   Weight:      Height:        Family Communication: None present at bedside  Disposition: Back to prior living environment Status is: Inpatient   Time spent: 56 minutes  Author: Loyce Dys, MD 07/20/2023 4:09 PM  For on call review [email or web address].

## 2023-07-21 DIAGNOSIS — N390 Urinary tract infection, site not specified: Secondary | ICD-10-CM | POA: Diagnosis not present

## 2023-07-21 LAB — CBC WITH DIFFERENTIAL/PLATELET
Abs Immature Granulocytes: 0.04 10*3/uL (ref 0.00–0.07)
Basophils Absolute: 0 10*3/uL (ref 0.0–0.1)
Basophils Relative: 1 %
Eosinophils Absolute: 0.5 10*3/uL (ref 0.0–0.5)
Eosinophils Relative: 7 %
HCT: 24.4 % — ABNORMAL LOW (ref 39.0–52.0)
Hemoglobin: 7.6 g/dL — ABNORMAL LOW (ref 13.0–17.0)
Immature Granulocytes: 1 %
Lymphocytes Relative: 15 %
Lymphs Abs: 1.1 10*3/uL (ref 0.7–4.0)
MCH: 27.2 pg (ref 26.0–34.0)
MCHC: 31.1 g/dL (ref 30.0–36.0)
MCV: 87.5 fL (ref 80.0–100.0)
Monocytes Absolute: 0.8 10*3/uL (ref 0.1–1.0)
Monocytes Relative: 11 %
Neutro Abs: 4.7 10*3/uL (ref 1.7–7.7)
Neutrophils Relative %: 65 %
Platelets: 201 10*3/uL (ref 150–400)
RBC: 2.79 MIL/uL — ABNORMAL LOW (ref 4.22–5.81)
RDW: 16.8 % — ABNORMAL HIGH (ref 11.5–15.5)
WBC: 7.2 10*3/uL (ref 4.0–10.5)
nRBC: 0 % (ref 0.0–0.2)

## 2023-07-21 LAB — BASIC METABOLIC PANEL
Anion gap: 6 (ref 5–15)
BUN: 43 mg/dL — ABNORMAL HIGH (ref 8–23)
CO2: 17 mmol/L — ABNORMAL LOW (ref 22–32)
Calcium: 8.1 mg/dL — ABNORMAL LOW (ref 8.9–10.3)
Chloride: 109 mmol/L (ref 98–111)
Creatinine, Ser: 2.29 mg/dL — ABNORMAL HIGH (ref 0.61–1.24)
GFR, Estimated: 30 mL/min — ABNORMAL LOW (ref >60)
Glucose, Bld: 140 mg/dL — ABNORMAL HIGH (ref 70–99)
Potassium: 5.2 mmol/L — ABNORMAL HIGH (ref 3.5–5.1)
Sodium: 132 mmol/L — ABNORMAL LOW (ref 135–145)

## 2023-07-21 LAB — CULTURE, URINE COMPREHENSIVE

## 2023-07-21 LAB — GLUCOSE, CAPILLARY
Glucose-Capillary: 103 mg/dL — ABNORMAL HIGH (ref 70–99)
Glucose-Capillary: 119 mg/dL — ABNORMAL HIGH (ref 70–99)
Glucose-Capillary: 117 mg/dL — ABNORMAL HIGH (ref 70–99)
Glucose-Capillary: 157 mg/dL — ABNORMAL HIGH (ref 70–99)

## 2023-07-21 MED ORDER — SODIUM ZIRCONIUM CYCLOSILICATE 10 G PO PACK
10.0000 g | PACK | Freq: Once | ORAL | Status: DC
  Filled 2023-07-21: qty 1

## 2023-07-21 MED ORDER — DAKINS (1/4 STRENGTH) 0.125 % EX SOLN
Freq: Every day | CUTANEOUS | Status: DC
  Filled 2023-07-21: qty 1

## 2023-07-21 MED ORDER — ZINC OXIDE 40 % EX OINT
TOPICAL_OINTMENT | Freq: Two times a day (BID) | CUTANEOUS | Status: DC
  Filled 2023-07-21: qty 113

## 2023-07-21 NOTE — Progress Notes (Signed)
Progress Note   [name] NUU:[id number] DOB: 1956/06/22 DOA: 07/18/2023     3 DOS: the patient was seen and examined on 07/21/2023   Brief hospital course: From HPI "[name] is a 68 y.o. 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  Patient had catheter exchanged by home health on 1/16.  At the urology appointment earlier in the day, patient's wife reported symptoms of weakness, poor oral intake, hallucinations and mucus-like secretion in urine-ongoing for 2 weeks.  Also has a odor from his sacral decubitus.  Urinalysis done at the office was abnormal.  "     Assessment and Plan   Complicated UTI (urinary tract infection) Chronic indwelling Foley catheter secondary to neurogenic bladder Frequent UTIs Patient symptomatic for weakness, poor oral intake, hallucinations S/p catheter exchange 1/16 by home health.  Urology visit on 1/17.   Urinalysis showing findings of UTI Urine culture showing gram-negative rods, Klebsiella pneumoniae identification pending Continue ceftriaxone Procalcitonin 0.18 and lactic acid 1.0   Hypotension History of essential hypertension BP 97/53 on arrival, possibly related to poor oral intake over the past 2 weeks versus sepsis Continue to follow Lactic acid and procalcitonin Has completed a course of IV fluid Hold home antihypertensives/amlodipine   Hyperkalemia-improving Continue to monitor electrolytes closely Received a dose of Lasix,    Acute renal failure superimposed on stage 3b chronic kidney disease (HCC) Acute metabolic acidosis Suspect ATN related to dehydration from poor oral intake related to acute illness.  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 Continue wound care   Hyponatremia-improved Chronic and stable Has used salt tablets in the past   Type II diabetes mellitus with renal manifestations (HCC) Continue insulin therapy Monitor glucose closely Hold home glipizide   Chronic anticoagulation - on Eliquis History of DVT Continue Eliquis   Chronic heart failure with preserved ejection fraction (HFpEF, >= 50%) (HCC) Patient was trialed on admission and received IV fluid resuscitation Last echo April 2023 showed EF 60 to 65% and G3 DD   Colostomy in place [facility]) Continue ostomy care   Anemia of chronic disease Continue CBC monitoring   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) Bipolar mood disorder History of traumatic brain injury Continue quetiapine, Trileptal and Namenda   Obesity, Class III, BMI 40-49.9 (morbid obesity) (HCC) Complicating factor to overall prognosis and care     DVT prophylaxis: Eliquis   Consults: none   Advance Care Planning:   Code Status: Prior    Family Communication: none   Disposition Plan: Back to previous home environment   Subjective:    Patient seen and examined at bedside this morning Admits to improvement in abdominal pain Denies nausea vomiting or chest pain Culture results still pending   Physical Exam: Vitals and nursing note reviewed.  Constitutional:      General: He is not in acute distress. HENT:     Head: Normocephalic and atraumatic.  Cardiovascular:     Rate  and Rhythm: Normal rate and regular rhythm.     Heart sounds: Normal heart sounds.  Pulmonary:     Effort: Pulmonary  effort is normal.     Breath sounds: Normal breath sounds.  Abdominal: Colostomy in place as well as Foley catheter    Palpations: Abdomen is soft.     Tenderness: There is no abdominal tenderness.  Neurological:     Mental Status: Mental status is at baseline.    Data reviewed:        Latest Ref Rng & Units 07/21/2023    4:19 AM 07/20/2023    4:21 AM 07/19/2023    4:35 AM  BMP  Glucose 70 - 99 mg/dL 086  578  469   BUN 8 - 23 mg/dL 43  40  46   Creatinine 0.61 - 1.24 mg/dL 6.29  5.28  4.13   Sodium 135 - 145 mmol/L 132  131  132   Potassium 3.5 - 5.1 mmol/L 5.2  4.9  4.9   Chloride 98 - 111 mmol/L 109  106  108   CO2 22 - 32 mmol/L 17  16  16    Calcium 8.9 - 10.3 mg/dL 8.1  8.2  8.1        Latest Ref Rng & Units 07/21/2023    4:19 AM 07/20/2023    4:21 AM 07/19/2023    4:35 AM  CBC  WBC 4.0 - 10.5 K/uL 7.2  9.3  9.2   Hemoglobin 13.0 - 17.0 g/dL 7.6  8.2  7.9   Hematocrit 39.0 - 52.0 % 24.4  25.6  25.0   Platelets 150 - 400 K/uL 201  193  172       Vitals:   07/21/23 0550 07/21/23 0747 07/21/23 0748 07/21/23 1514  BP: 109/60 108/60  133/69  Pulse: (!) 54 (!) 49 (!) 50 (!) 52  Resp:  18  18  Temp: 97.6 F (36.4 C) 98.5 F (36.9 C)  (!) 97.5 F (36.4 C)  TempSrc: Oral     SpO2: 100% 99% 100% 100%  Weight:      Height:        Author: Loyce Dys, MD 07/21/2023 4:21 PM  For on call review [email or web address].

## 2023-07-21 NOTE — Evaluation (Addendum)
Occupational Therapy Evaluation Patient Details Name: [name] MRN: [id number] DOB: 1956/05/30 Today's Date: 07/21/2023   History of Present Illness Pt is a 68 yo male presenting with UTI, sacral decubitus ulcer, and severe sepsis. 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   Clinical Impression   Pt with cognitive impairments at baseline, unsure of accuracy of PLOF and home setup. Info taken from chart review. Pt A&Ox4, states his wife assists with ADLs except for feeding/grooming. Inconsistencies noted - pt states initially he transfers himself from w/c to bed, then later states he uses a hoyer lift. States he has all necessary DME (this will need to be verified by family/spouse).   On OT eval, pt demos ability to perform self-feeding and grooming with UE ROM. Good bilat grip strength, grossly 4/5 MMT BUE with WFL ROM. Pt requires maxA to lift BLE to float heels, unable to hold legs off bed but initiates to assist OT. Pt refusing to perform bed mobility this date but agrees to wash face sitting upright in bed. Anticipate max-total +2 for transitioning to seated EOB, max-total assist for ADLs bed level. Pt appears to be near baseline for functional performance. Pt would benefit from skilled OT services to address noted impairments and functional limitations (see below for any additional details) in order to maximize safety and independence while minimizing falls risk and caregiver burden. Anticipate the need for follow up [facility] OT services upon acute hospital DC.       If plan is discharge home, recommend the following: Two people to help with walking and/or transfers;A lot of help with bathing/dressing/bathroom;Two people to help with bathing/dressing/bathroom;Assistance with cooking/housework;Direct  supervision/assist for financial management;Direct supervision/assist for medications management;Assist for transportation;Help with stairs or ramp for entrance;Supervision due to cognitive status    Functional Status Assessment  Patient has had a recent decline in their functional status and demonstrates the ability to make significant improvements in function in a reasonable and predictable amount of time.  Equipment Recommendations  None recommended by OT (pt states all necessary DME in home (will need to be confirmed with family))       Precautions / Restrictions Precautions Precautions: Fall Restrictions Weight Bearing Restrictions Per Provider Order: No      Mobility Bed Mobility Overal bed mobility: Needs Assistance             General bed mobility comments: NT, pt refusing to roll    Transfers Overall transfer level: Needs assistance                 General transfer comment: NT (hoyer lift baseline per pt)      Balance Overall balance assessment: Needs assistance                             High Level Balance Comments: NT, pt refusing           ADL either performed or assessed with clinical judgement   ADL Overall ADL's : Needs assistance/impaired Eating/Feeding: Set up;Bed level;Sitting   Grooming: Wash/dry hands;Wash/dry face;Bed level                       Toileting- Clothing Manipulation and Hygiene: Total assistance Toileting - Clothing Manipulation Details (indicate cue type and reason): foley + ostomy in place  General ADL Comments: Pt demos ability to perform self-feeding and grooming bed level with current UE ROM. Pt declining to roll on OT eval. Anticipate MAX-TOTAL A for bed level ADLs.     Vision Baseline Vision/History: 1 Wears glasses Ability to See in Adequate Light: 0 Adequate Patient Visual Report: No change from baseline Vision Assessment?: Wears glasses for reading            Pertinent  Vitals/Pain Pain Assessment Pain Assessment: No/denies pain     Extremity/Trunk Assessment Upper Extremity Assessment Upper Extremity Assessment: Right hand dominant (good bilat grip strength, grossly 4/5 MMT)   Lower Extremity Assessment Lower Extremity Assessment: Generalized weakness (pt unable to lift BLE off bed, requires MAX-TOTAL assist to raise legs to float heels)       Communication Communication Communication: No apparent difficulties   Cognition Arousal: Alert Behavior During Therapy: WFL for tasks assessed/performed, Flat affect Overall Cognitive Status: History of cognitive impairments - at baseline                                       General Comments  heels floated            Home Living Family/patient expects to be discharged to:: Private residence Living Arrangements: Spouse/significant other Available Help at Discharge: Family;Available PRN/intermittently Type of Home: House Home Access: Ramped entrance     Home Layout: One level     Bathroom Shower/Tub: Producer, television/film/video: Standard Bathroom Accessibility: Yes   Home Equipment: Wheelchair - Forensic psychologist (2 wheels);Shower seat - built in;Grab bars - tub/shower;Hospital bed;Other (comment) (hoyer lift)          Prior Functioning/Environment Prior Level of Function : Patient poor historian/Family not available             Mobility Comments: Hoyer lift for transfers (?) ADLs Comments: Requires assist for ADLs, pt able to perform self-feeding and grooming        OT Problem List: Decreased strength;Decreased activity tolerance;Impaired balance (sitting and/or standing);Decreased cognition;Decreased safety awareness;Obesity;Increased edema;Decreased range of motion;Decreased knowledge of use of DME or AE      OT Treatment/Interventions: Self-care/ADL training;Neuromuscular education;Therapeutic exercise;Energy conservation;DME and/or AE  instruction;Therapeutic activities;Cognitive remediation/compensation;Patient/family education;Balance training    OT Goals(Current goals can be found in the care plan section) Acute Rehab OT Goals OT Goal Formulation: Patient unable to participate in goal setting Time For Goal Achievement: 08/04/23 Potential to Achieve Goals: Fair  OT Frequency: Min 1X/week       AM-PAC OT "6 Clicks" Daily Activity     Outcome Measure Help from another person eating meals?: None Help from another person taking care of personal grooming?: None Help from another person toileting, which includes using toliet, bedpan, or urinal?: Total Help from another person bathing (including washing, rinsing, drying)?: Total Help from another person to put on and taking off regular upper body clothing?: Total Help from another person to put on and taking off regular lower body clothing?: Total 6 Click Score: 12   End of Session Nurse Communication: Mobility status;Need for lift equipment (prevlon boot orders noted; not yet delivered to room)  Activity Tolerance: Patient tolerated treatment well Patient left: in bed;with call bell/phone within reach;with bed alarm set  OT Visit Diagnosis: Muscle weakness (generalized) (M62.81);Other abnormalities of gait and mobility (R26.89);Other symptoms and signs involving cognitive function;Other symptoms and signs involving the nervous system (R29.898)  Time: 4010-2725 OT Time Calculation (min): 12 min Charges:  OT General Charges $OT Visit: 1 Visit OT Evaluation $OT Eval Low Complexity: 1 Low  Paulita Licklider L. Jdyn Parkerson, OTR/L  07/21/23, 2:38 PM

## 2023-07-21 NOTE — Consult Note (Signed)
WOC Nurse Consult Note: patient with longstanding history of Stage 4 to sacrum and (reported as) stage 3 to B heels; followed at wound care center for the same since 2022; last seen at [facility] 06/24/2023 with wound care of hydrofera blue which is not on formulary at [facility]  Reason for Consult: buttocks, heels, scrotum  Wound type: 1.  L heel with a Stage 3 Pressure Injury 2.  R heel with Unstageable PI  3.  Stage 4 Pressure Injury Sacrum  4.  Irritant Contact Dermatitis to inner thighs and scrotum  ICD-10 CM Codes for Irritant Dermatitis  L30.4  - Erythema intertrigo. Also used for abrasion of the hand, chafing of the skin, dermatitis due to sweating and friction, friction dermatitis, friction eczema, and genital/thigh intertrigo.  Pressure Injury POA: Yes Measurement: 1.  L heel 1 cm x 2 cm Stage 3 PI 100% pink moist  2.  R posterior heel Unstageable PI 3 cm x 2 cm 40% pink moist 30% black eschar 30% yellow  3.  Stage 4 PI sacrum 3 cm x 4 cm x 3.5 cm 100% pink and moist what can be assessed with scattered Stage 2 Pressure Injuries noted to L lateral buttock 100% pink moist 4.  Full thickness skin loss to anterior surface of scrotum 2 cm x 1 cm 100% pink and moist likely d/t moisture and friction  Wound bed: as above  Drainage (amount, consistency, odor) sacral wound is clean but has a foul smelling tan exudate  Periwound: scattered areas of Stage 2 pressure injuries and pink epithelium to buttocks; heels with dry flaky hyperkeratotic skin  Dressing procedure/placement/frequency: Cleanse B heel wounds with Vashe wound cleanser Hart [place] [phone]), apply silver hydrofiber Hart [place] [phone]) cut to fit wound bed daily, cover with dry gauze and Kerlix roll gauze. Bilateral feet should be placed in Prevalon boots to offload pressure.  Hart [place] [phone] Cleanse sacral Stage 4 with Vashe wound cleanser and using a Q tip applicator apply Dakin's moistened gauze to wound bed daily x 3 days.  Cover with dry gauze  and silicone foam or ABD pad whichever is preferred. After Dakin's complete (07/23/2023) will go back to silver hydrofiber which is the substitution for hydrofera blue.  Clean lower buttocks, perineum and scrotum with Vashe wound cleanser, apply a thin layer of Desitin 2 times daily and prn soiling.    Patient should be placed on a low air loss mattress for pressure redistribution and moisture management.   WOC Nurse ostomy consult note Stoma type/location: LMQ colostomy  Stomal assessment/size: approximately 1 3/4", well budded, pink moist  Peristomal assessment: did not assess (current pouch intact)  Treatment options for stomal/peristomal skin:  Output soft brown stool in pouch  Ostomy pouching: appears to be wearing a 1 piece flat flexible Hart [place] [phone]); I placed a 2 3/4" skin barrier, pouch and 2" barrier ring in room however will ask secretary to order [phone] and [phone].  Education provided: none, patient dependent with ostomy care.   Enrolled patient in [employer] DC program: no, longstanding ostomy   POC dicussed with bedside nurse. I appreciate her assistance with this consult.  WOC team will not follow. Re-consult if further needs arise.   Thank you,    Priscella Mann MSN, RN-BC, [employer] [phone]

## 2023-07-21 NOTE — Care Management Important Message (Signed)
Important Message  Patient Details  Name: [name] MRN: [id number] Date of Birth: 07/30/1955   Important Message Given:  Yes - Medicare IM     Cristela Blue, CMA 07/21/2023, 10:22 AM

## 2023-07-22 ENCOUNTER — Ambulatory Visit: Payer: Medicare Other | Admitting: Physician Assistant

## 2023-07-22 DIAGNOSIS — N390 Urinary tract infection, site not specified: Secondary | ICD-10-CM | POA: Diagnosis not present

## 2023-07-22 LAB — CBC WITH DIFFERENTIAL/PLATELET
Abs Immature Granulocytes: 0.03 10*3/uL (ref 0.00–0.07)
Basophils Absolute: 0 10*3/uL (ref 0.0–0.1)
Basophils Relative: 1 %
Eosinophils Absolute: 0.6 10*3/uL — ABNORMAL HIGH (ref 0.0–0.5)
Eosinophils Relative: 9 %
HCT: 25.8 % — ABNORMAL LOW (ref 39.0–52.0)
Hemoglobin: 8 g/dL — ABNORMAL LOW (ref 13.0–17.0)
Immature Granulocytes: 1 %
Lymphocytes Relative: 19 %
Lymphs Abs: 1.1 10*3/uL (ref 0.7–4.0)
MCH: 27.1 pg (ref 26.0–34.0)
MCHC: 31 g/dL (ref 30.0–36.0)
MCV: 87.5 fL (ref 80.0–100.0)
Monocytes Absolute: 0.6 10*3/uL (ref 0.1–1.0)
Monocytes Relative: 10 %
Neutro Abs: 3.7 10*3/uL (ref 1.7–7.7)
Neutrophils Relative %: 60 %
Platelets: 222 10*3/uL (ref 150–400)
RBC: 2.95 MIL/uL — ABNORMAL LOW (ref 4.22–5.81)
RDW: 17.1 % — ABNORMAL HIGH (ref 11.5–15.5)
WBC: 6.1 10*3/uL (ref 4.0–10.5)
nRBC: 0 % (ref 0.0–0.2)

## 2023-07-22 LAB — BASIC METABOLIC PANEL
Anion gap: 6 (ref 5–15)
BUN: 39 mg/dL — ABNORMAL HIGH (ref 8–23)
CO2: 18 mmol/L — ABNORMAL LOW (ref 22–32)
Calcium: 8.5 mg/dL — ABNORMAL LOW (ref 8.9–10.3)
Chloride: 111 mmol/L (ref 98–111)
Creatinine, Ser: 2.15 mg/dL — ABNORMAL HIGH (ref 0.61–1.24)
GFR, Estimated: 33 mL/min — ABNORMAL LOW (ref >60)
Glucose, Bld: 130 mg/dL — ABNORMAL HIGH (ref 70–99)
Potassium: 5.2 mmol/L — ABNORMAL HIGH (ref 3.5–5.1)
Sodium: 135 mmol/L (ref 135–145)

## 2023-07-22 LAB — GLUCOSE, CAPILLARY
Glucose-Capillary: 114 mg/dL — ABNORMAL HIGH (ref 70–99)
Glucose-Capillary: 128 mg/dL — ABNORMAL HIGH (ref 70–99)

## 2023-07-22 MED ORDER — CIPROFLOXACIN HCL 500 MG PO TABS: 500.0000 mg | ORAL_TABLET | Freq: Two times a day (BID) | ORAL | 0 refills | Status: AC

## 2023-07-22 MED ORDER — SODIUM ZIRCONIUM CYCLOSILICATE 10 G PO PACK
10.0000 g | PACK | Freq: Two times a day (BID) | ORAL | Status: DC
  Administered 2023-07-22: 10 g via ORAL
  Filled 2023-07-22: qty 1

## 2023-07-22 MED ORDER — HYDROCODONE-ACETAMINOPHEN 5-325 MG PO TABS: 1.0000 | ORAL_TABLET | Freq: Four times a day (QID) | ORAL | 0 refills | Status: AC | PRN

## 2023-07-22 NOTE — Evaluation (Signed)
Physical Therapy Evaluation Patient Details Name: [name] MRN: [id number] DOB: Nov 28, 1955 Today's Date: 07/22/2023  History of Present Illness  Pt is a 68 yo male presenting with UTI, sacral decubitus ulcer, and severe sepsis. 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   Clinical Impression  The pt was A&Ox4, denied pain. PT spoke with pt and family; at baseline the pt needs assistance for transfers and for ADLs (see PLOF section for more details). Pt agreeable to mobility with some encouragement. Supine to sit with modA and use of bed rails. Sit <> stand with maxA handheld assist but unable to safely complete stand pivot without second assist (modAx2 to stand pivot). Pt up in chair with needs in reach.  Overall the patient demonstrated deficits (see "PT Problem List") that impede the patient's functional abilities, safety, and mobility and would benefit from skilled PT intervention.          If plan is discharge home, recommend the following: A lot of help with walking and/or transfers;A lot of help with bathing/dressing/bathroom;Assistance with feeding;Assist for transportation;Assistance with cooking/housework;Help with stairs or ramp for entrance;Direct supervision/assist for medications management   Can travel by private vehicle        Equipment Recommendations BSC/3in1  Recommendations for Other Services       Functional Status Assessment Patient has had a recent decline in their functional status and demonstrates the ability to make significant improvements in function in a reasonable and predictable amount of time.     Precautions / Restrictions Precautions Precautions: Fall Restrictions Weight Bearing Restrictions Per Provider Order: No      Mobility  Bed Mobility Overal bed mobility:  Needs Assistance Bed Mobility: Supine to Sit     Supine to sit: Mod assist, HOB elevated, Used rails          Transfers Overall transfer level: Needs assistance Equipment used: 1 person hand held assist, 2 person hand held assist Transfers: Bed to chair/wheelchair/BSC   Stand pivot transfers: Max assist, +2 physical assistance, Mod assist         General transfer comment: able to stand with maxAx1, but to complete pivot safely, modAx2    Ambulation/Gait                  Stairs            Wheelchair Mobility     Tilt Bed    Modified Rankin (Stroke Patients Only)       Balance Overall balance assessment: Needs assistance Sitting-balance support: Feet supported Sitting balance-Leahy Scale: Fair       Standing balance-Leahy Scale: Zero                               Pertinent Vitals/Pain Pain Assessment Pain Assessment: No/denies pain    Home Living Family/patient expects to be discharged to:: Private residence Living Arrangements: Spouse/significant other Available Help at Discharge: Family;Available PRN/intermittently Type of Home: House Home Access: Ramped entrance       Home Layout: One level Home Equipment: Wheelchair - Forensic psychologist (2 wheels);Shower seat - built in;Grab bars - tub/shower;Hospital bed;Other (comment) (hoyer lift)      Prior Function Prior Level of Function : Needs assist       Physical Assist : Mobility (physical);ADLs (physical) Mobility (physical): Bed mobility;Transfers;Gait ADLs (  physical): Grooming;Bathing;Dressing;Toileting;IADLs Mobility Comments: pt able to stand pivot with assist from family (sounds like at least a modA) to [facility], able to minimally walk with a RW on a good day, family said pt is self limiting ADLs Comments: Requires assist for ADLs, pt able to perform self-feeding and grooming, family performs colostomy care, able to assist with sink bath     Extremity/Trunk  Assessment   Upper Extremity Assessment Upper Extremity Assessment: Defer to OT evaluation    Lower Extremity Assessment Lower Extremity Assessment: Generalized weakness (unable to move bilateral ankles, able to LAQ bilaterally in sitting, minimal hip flexion)       Communication      Cognition Arousal: Alert Behavior During Therapy: WFL for tasks assessed/performed Overall Cognitive Status: History of cognitive impairments - at baseline                                 General Comments: A&Ox4        General Comments      Exercises     Assessment/Plan    PT Assessment Patient needs continued PT services  PT Problem List Decreased strength;Decreased activity tolerance;Decreased balance;Decreased mobility       PT Treatment Interventions DME instruction;Balance training;Gait training;Neuromuscular re-education;Stair training;Functional mobility training;Patient/family education;Therapeutic activities;Therapeutic exercise    PT Goals (Current goals can be found in the Care Plan section)  Acute Rehab PT Goals Patient Stated Goal: to get stronger PT Goal Formulation: With patient Time For Goal Achievement: 08/05/23 Potential to Achieve Goals: Fair    Frequency Min 1X/week     Co-evaluation               AM-PAC PT "6 Clicks" Mobility  Outcome Measure Help needed turning from your back to your side while in a flat bed without using bedrails?: A Lot Help needed moving from lying on your back to sitting on the side of a flat bed without using bedrails?: A Lot Help needed moving to and from a bed to a chair (including a wheelchair)?: A Lot Help needed standing up from a chair using your arms (e.g., wheelchair or bedside chair)?: A Lot Help needed to walk in hospital room?: Total Help needed climbing 3-5 steps with a railing? : Total 6 Click Score: 10    End of Session   Activity Tolerance: Patient limited by fatigue Patient left: in chair;with  call bell/phone within reach;with chair alarm set Nurse Communication: Mobility status PT Visit Diagnosis: Other abnormalities of gait and mobility (R26.89);Difficulty in walking, not elsewhere classified (R26.2);Muscle weakness (generalized) (M62.81)    Time: 4098-1191 PT Time Calculation (min) (ACUTE ONLY): 18 min   Charges:   PT Evaluation $PT Eval Moderate Complexity: 1 Mod PT Treatments $Therapeutic Activity: 8-22 mins PT General Charges $$ ACUTE PT VISIT: 1 Visit         Olga Coaster PT, DPT 12:48 PM,07/22/23

## 2023-07-22 NOTE — TOC Transition Note (Signed)
Transition of Care [facility]) - Discharge Note   Patient Details  Name: [name] MRN: [id number] Date of Birth: 03-Mar-1956  Transition of [facility]) CM/SW Contact:  Allena Katz, LCSW Phone Number: 07/22/2023, 11:13 AM   Clinical Narrative:   Pt has orders to discharge home with [facility]. He was previously in with [facility] for [facility] RN. Wife reports hoyer at baseline of which she has at her home. She states no DME needs. She states that she is home and would like [place] to arrange for transport. She confirmed her address. CSW to call for [facility].    Final next level of care: Home w Home Health Services Barriers to Discharge: Barriers Resolved   Patient Goals and CMS Choice   CMS Medicare.gov Compare Post Acute Care list provided to:: Patient        Discharge Placement                Patient to be transferred to facility by: [facility] Name of family member notified: WIFE Patient and family notified of of transfer: 07/22/23  Discharge Plan and Services Additional resources added to the After Visit Summary for                            [facility] Arranged: RN [facility] Agency: [facility] Date [facility] Agency Contacted: 07/22/23      Social Drivers of Health (SDOH) Interventions SDOH Screenings   Food Insecurity: Patient Declined (07/19/2023)  Housing: Patient Declined (07/19/2023)  Transportation Needs: Patient Declined (07/19/2023)  Utilities: Patient Declined (07/19/2023)  Depression (PHQ2-9): Medium Risk (12/04/2022)  Financial Resource Strain: Low Risk  (12/31/2020)   Received from [facility], [facility]  Physical Activity: Inactive (09/09/2017)  Social Connections: Patient Declined (07/19/2023)  Stress: No Stress Concern Present (09/09/2017)  Tobacco Use: Low Risk  (07/19/2023)     Readmission Risk Interventions    07/22/2023   11:12 AM 03/05/2023   12:55 PM 02/28/2023   11:21 AM  Readmission Risk Prevention Plan  Transportation Screening Complete   Complete  Medication Review Oceanographer) Complete  Complete  PCP or Specialist appointment within 3-5 days of discharge Complete  Complete  HRI or Home Care Consult   Complete  SW Recovery Care/Counseling Consult Complete  Complete  Palliative Care Screening Not Applicable  Not Applicable  Skilled Nursing Facility Complete Complete Not Applicable

## 2023-07-22 NOTE — Plan of Care (Signed)
  Problem: Education: Goal: Ability to describe self-care measures that may prevent or decrease complications (Diabetes Survival Skills Education) will improve 07/22/2023 0056 by Eulis Manly, RN Outcome: Progressing 07/22/2023 0056 by Eulis Manly, RN Outcome: Progressing Goal: Individualized Educational Video(s) 07/22/2023 0056 by Eulis Manly, RN Outcome: Progressing 07/22/2023 0056 by Eulis Manly, RN Outcome: Progressing   Problem: Coping: Goal: Ability to adjust to condition or change in health will improve 07/22/2023 0056 by Eulis Manly, RN Outcome: Progressing 07/22/2023 0056 by Eulis Manly, RN Outcome: Progressing   Problem: Fluid Volume: Goal: Ability to maintain a balanced intake and output will improve 07/22/2023 0056 by Eulis Manly, RN Outcome: Progressing 07/22/2023 0056 by Eulis Manly, RN Outcome: Progressing   Problem: Fluid Volume: Goal: Ability to maintain a balanced intake and output will improve 07/22/2023 0056 by Eulis Manly, RN Outcome: Progressing 07/22/2023 0056 by Eulis Manly, RN Outcome: Progressing   Problem: Health Behavior/Discharge Planning: Goal: Ability to identify and utilize available resources and services will improve 07/22/2023 0056 by Eulis Manly, RN Outcome: Progressing 07/22/2023 0056 by Eulis Manly, RN Outcome: Progressing Goal: Ability to manage health-related needs will improve 07/22/2023 0056 by Eulis Manly, RN Outcome: Progressing 07/22/2023 0056 by Eulis Manly, RN Outcome: Progressing   Problem: Metabolic: Goal: Ability to maintain appropriate glucose levels will improve 07/22/2023 0056 by Eulis Manly, RN Outcome: Progressing 07/22/2023 0056 by Eulis Manly, RN Outcome: Progressing   Problem: Nutritional: Goal: Maintenance of adequate nutrition will improve 07/22/2023 0056 by Eulis Manly, RN Outcome: Progressing 07/22/2023 0056 by Eulis Manly,  RN Outcome: Progressing Goal: Progress toward achieving an optimal weight will improve 07/22/2023 0056 by Eulis Manly, RN Outcome: Progressing 07/22/2023 0056 by Eulis Manly, RN Outcome: Progressing   Problem: Skin Integrity: Goal: Risk for impaired skin integrity will decrease 07/22/2023 0056 by Eulis Manly, RN Outcome: Progressing 07/22/2023 0056 by Eulis Manly, RN Outcome: Progressing

## 2023-07-22 NOTE — Discharge Summary (Signed)
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  Patient had catheter exchanged by home health on 1/16.  At the urology appointment earlier in the day, patient's wife reported symptoms of weakness, poor oral intake, hallucinations and mucus-like secretion in  urine-ongoing for 2 weeks.  Also has a odor from his sacral decubitus.  Urinalysis done at the office was abnormal.  Urine culture result grew Klebsiella as well as Pseudomonas that was sensitive to ciprofloxacin.  Patient clinical condition improving therefore being discharged today to complete a course of oral antibiotics        Consultants: ID pharmacist Procedures performed: None Disposition: Home health Diet recommendation:  Cardiac diet DISCHARGE MEDICATION: Allergies as of 07/22/2023   No Known Allergies      Medication List     TAKE these medications    amLODipine-olmesartan 5-20 MG tablet Commonly known as: AZOR Take 1 tablet by mouth daily.   ciprofloxacin 500 MG tablet Commonly known as: CIPRO Take 1 tablet (500 mg total) by mouth 2 (two) times daily for 7 days.   cyanocobalamin 1000 MCG tablet Commonly known as: VITAMIN B12 Take 1,000 mcg by mouth daily.   Dexcom G7 Receiver Devi UAD for blood sugar monitoring.   Eliquis 5 MG Tabs tablet Generic drug: apixaban Take 5 mg by mouth 2 (two) times daily.   glipiZIDE 2.5 MG 24 hr tablet Commonly known as: GLUCOTROL XL Take 2.5 mg by mouth daily.   memantine 10 MG tablet Commonly known as: NAMENDA Take 10 mg by mouth 2 (two) times daily.   Oxcarbazepine 300 MG tablet Commonly known as: TRILEPTAL Take 300 mg by mouth 2 (two) times daily.   QUEtiapine 25 MG tablet Commonly known as: SEROQUEL TAKE 1 TO 2 TABLETS BY MOUTH ONCE DAILY AS NEEDED What changed: See the new instructions.   rosuvastatin 20 MG tablet Commonly known as: CRESTOR Take 20 mg by mouth at bedtime.        Discharge Exam: Filed Weights   07/18/23 1246 07/18/23 1909  Weight: 126.6 kg 119.6 kg   Constitutional:  General: He is not in acute distress. HENT:     Head: Normocephalic and atraumatic.  Cardiovascular:     Rate and Rhythm: Normal rate and regular rhythm.     Heart sounds: Normal heart sounds.  Pulmonary:      Effort: Pulmonary effort is normal.     Breath sounds: Normal breath sounds.  Abdominal: Colostomy in place as well as Foley catheter    Palpations: Abdomen is soft.     Tenderness: There is no abdominal tenderness.  Neurological:     Mental Status: Mental status is at baseline.  Wound noted to the buttocks area with clean dressing  Condition at discharge: good  The results of significant diagnostics from this hospitalization (including imaging, microbiology, ancillary and laboratory) are listed below for reference.   Imaging Studies: No results found.  Microbiology: Results for orders placed or performed during the hospital encounter of 07/18/23  Culture, blood (x 2)     Status: None (Preliminary result)   Collection Time: 07/19/23  2:05 AM   Specimen: BLOOD  Result Value Ref Range Status   Specimen Description BLOOD RIGHT ANTECUBITAL  Final   Special Requests   Final    BOTTLES DRAWN AEROBIC AND ANAEROBIC Blood Culture adequate volume   Culture   Final    NO GROWTH 3 DAYS Performed at [facility], [address] [place], [place] [address]    Report Status PENDING  Incomplete  Culture, blood (x 2)     Status: None (Preliminary result)   Collection Time: 07/19/23  2:16 AM   Specimen: BLOOD  Result Value Ref Range Status   Specimen Description BLOOD BLOOD RIGHT HAND  Final   Special Requests   Final    BOTTLES DRAWN AEROBIC AND ANAEROBIC Blood Culture results may not be optimal due to an inadequate volume of blood received in culture bottles   Culture   Final    NO GROWTH 3 DAYS Performed at [facility], [address] [place], [place] [address]    Report Status PENDING  Incomplete  Culture, Urine (Do not remove urinary catheter, catheter placed by urology or difficult to place)     Status: Abnormal (Preliminary result)   Collection Time: 07/19/23  4:35 AM   Specimen: Urine, Catheterized  Result Value Ref Range Status   Specimen Description   Final     URINE, CATHETERIZED Performed at [facility], [address], [place], [place] [address]    Special Requests   Final    Normal Performed at [facility], [address] [place], [place] [address]    Culture (A)  Final    40,000 COLONIES/mL KLEBSIELLA PNEUMONIAE 40,000 COLONIES/mL PSEUDOMONAS AERUGINOSA 10,000 COLONIES/mL STAPHYLOCOCCUS AUREUS REPEATING SUCEPTIBILITY Performed at [facility], [address] [place], [place] [address]    Report Status PENDING  Incomplete   Organism ID, Bacteria KLEBSIELLA PNEUMONIAE (A)  Final   Organism ID, Bacteria PSEUDOMONAS AERUGINOSA (A)  Final      Susceptibility   Klebsiella pneumoniae - MIC*    AMPICILLIN RESISTANT Resistant     CEFAZOLIN <=4 SENSITIVE Sensitive     CEFEPIME <=0.12 SENSITIVE Sensitive     CEFTRIAXONE <=0.25 SENSITIVE Sensitive     CIPROFLOXACIN <=0.25 SENSITIVE Sensitive     GENTAMICIN <=1 SENSITIVE Sensitive     IMIPENEM <=0.25 SENSITIVE Sensitive     NITROFURANTOIN 32 SENSITIVE Sensitive     TRIMETH/SULFA <=20 SENSITIVE Sensitive     AMPICILLIN/SULBACTAM <=2  SENSITIVE Sensitive     PIP/TAZO <=4 SENSITIVE Sensitive ug/mL    * 40,000 COLONIES/mL KLEBSIELLA PNEUMONIAE   Pseudomonas aeruginosa - MIC*    CEFTAZIDIME 2 SENSITIVE Sensitive     CIPROFLOXACIN <=0.25 SENSITIVE Sensitive     GENTAMICIN <=1 SENSITIVE Sensitive     IMIPENEM 2 SENSITIVE Sensitive     PIP/TAZO 8 SENSITIVE Sensitive ug/mL    CEFEPIME 2 SENSITIVE Sensitive     * 40,000 COLONIES/mL PSEUDOMONAS AERUGINOSA    Labs: CBC: Recent Labs  Lab 07/18/23 1300 07/19/23 0435 07/20/23 0421 07/21/23 0419 07/22/23 0439  WBC 8.3 9.2 9.3 7.2 6.1  NEUTROABS  --   --  6.8 4.7 3.7  HGB 8.8* 7.9* 8.2* 7.6* 8.0*  HCT 29.0* 25.0* 25.6* 24.4* 25.8*  MCV 90.6 88.0 85.6 87.5 87.5  PLT 199 172 193 201 222   Basic Metabolic Panel: Recent Labs  Lab 07/18/23 2033 07/19/23 0435 07/20/23 0421 07/21/23 0419 07/22/23 0439  NA  130* 132* 131* 132* 135  K 5.7* 4.9 4.9 5.2* 5.2*  CL 107 108 106 109 111  CO2 16* 16* 16* 17* 18*  GLUCOSE 128* 140* 143* 140* 130*  BUN 49* 46* 40* 43* 39*  CREATININE 2.39* 2.43* 2.35* 2.29* 2.15*  CALCIUM 8.3* 8.1* 8.2* 8.1* 8.5*   Liver Function Tests: No results for input(s): "AST", "ALT", "ALKPHOS", "BILITOT", "PROT", "ALBUMIN" in the last 168 hours. CBG: Recent Labs  Lab 07/21/23 1151 07/21/23 1632 07/21/23 2100 07/22/23 0733 07/22/23 1216  GLUCAP 119* 117* 157* 114* 128*    Discharge time spent:  35 minutes.  Signed: Loyce Dys, MD [facility] 07/22/2023

## 2023-07-23 LAB — URINE CULTURE: Special Requests: NORMAL

## 2023-07-24 ENCOUNTER — Other Ambulatory Visit: Payer: Self-pay | Admitting: Psychiatry

## 2023-07-24 LAB — CULTURE, BLOOD (ROUTINE X 2)
Culture: NO GROWTH
Culture: NO GROWTH
Special Requests: ADEQUATE

## 2023-07-29 ENCOUNTER — Encounter: Payer: Medicare Other | Attending: Physician Assistant | Admitting: Physician Assistant

## 2023-07-29 DIAGNOSIS — I10 Essential (primary) hypertension: Secondary | ICD-10-CM | POA: Insufficient documentation

## 2023-07-29 DIAGNOSIS — L89613 Pressure ulcer of right heel, stage 3: Secondary | ICD-10-CM | POA: Diagnosis not present

## 2023-07-29 DIAGNOSIS — L89154 Pressure ulcer of sacral region, stage 4: Secondary | ICD-10-CM | POA: Diagnosis present

## 2023-07-29 DIAGNOSIS — L97822 Non-pressure chronic ulcer of other part of left lower leg with fat layer exposed: Secondary | ICD-10-CM | POA: Diagnosis not present

## 2023-07-29 DIAGNOSIS — M6281 Muscle weakness (generalized): Secondary | ICD-10-CM | POA: Diagnosis not present

## 2023-07-29 DIAGNOSIS — F319 Bipolar disorder, unspecified: Secondary | ICD-10-CM | POA: Diagnosis not present

## 2023-07-29 DIAGNOSIS — Z87820 Personal history of traumatic brain injury: Secondary | ICD-10-CM | POA: Diagnosis not present

## 2023-07-29 DIAGNOSIS — E11622 Type 2 diabetes mellitus with other skin ulcer: Secondary | ICD-10-CM | POA: Diagnosis not present

## 2023-08-08 ENCOUNTER — Other Ambulatory Visit: Payer: Self-pay | Admitting: Psychiatry

## 2023-08-08 DIAGNOSIS — F3178 Bipolar disorder, in full remission, most recent episode mixed: Secondary | ICD-10-CM

## 2023-08-08 DIAGNOSIS — F411 Generalized anxiety disorder: Secondary | ICD-10-CM

## 2023-08-08 DIAGNOSIS — G4701 Insomnia due to medical condition: Secondary | ICD-10-CM

## 2023-08-19 ENCOUNTER — Encounter: Payer: Medicare Other | Attending: Physician Assistant | Admitting: Physician Assistant

## 2023-08-19 DIAGNOSIS — L89154 Pressure ulcer of sacral region, stage 4: Secondary | ICD-10-CM | POA: Insufficient documentation

## 2023-08-19 DIAGNOSIS — F319 Bipolar disorder, unspecified: Secondary | ICD-10-CM | POA: Insufficient documentation

## 2023-08-19 DIAGNOSIS — Z87820 Personal history of traumatic brain injury: Secondary | ICD-10-CM | POA: Diagnosis not present

## 2023-08-19 DIAGNOSIS — Z7901 Long term (current) use of anticoagulants: Secondary | ICD-10-CM | POA: Insufficient documentation

## 2023-08-19 DIAGNOSIS — L89613 Pressure ulcer of right heel, stage 3: Secondary | ICD-10-CM | POA: Diagnosis not present

## 2023-08-19 DIAGNOSIS — M6281 Muscle weakness (generalized): Secondary | ICD-10-CM | POA: Diagnosis not present

## 2023-08-19 DIAGNOSIS — I10 Essential (primary) hypertension: Secondary | ICD-10-CM | POA: Insufficient documentation

## 2023-09-06 ENCOUNTER — Other Ambulatory Visit: Payer: Self-pay | Admitting: Psychiatry

## 2023-09-09 ENCOUNTER — Ambulatory Visit: Payer: Medicare Other | Admitting: Physician Assistant

## 2023-09-16 ENCOUNTER — Other Ambulatory Visit: Payer: Self-pay

## 2023-09-16 ENCOUNTER — Encounter: Payer: Self-pay | Admitting: Internal Medicine

## 2023-09-16 ENCOUNTER — Encounter: Attending: Physician Assistant | Admitting: Physician Assistant

## 2023-09-16 ENCOUNTER — Other Ambulatory Visit: Payer: Self-pay | Admitting: Physician Assistant

## 2023-09-16 ENCOUNTER — Ambulatory Visit
Admission: RE | Admit: 2023-09-16 | Discharge: 2023-09-16 | Disposition: A | Discharge status: Home / Self Care | Source: Ambulatory Visit | Attending: Physician Assistant | Admitting: Physician Assistant

## 2023-09-16 ENCOUNTER — Emergency Department

## 2023-09-16 ENCOUNTER — Inpatient Hospital Stay
Admission: EM | Admit: 2023-09-16 | Discharge: 2023-09-21 | DRG: 622 | Disposition: A | Discharge status: Home / Self Care | Source: Ambulatory Visit | Attending: Internal Medicine | Admitting: Internal Medicine

## 2023-09-16 DIAGNOSIS — Z1621 Resistance to vancomycin: Secondary | ICD-10-CM | POA: Diagnosis present

## 2023-09-16 DIAGNOSIS — E875 Hyperkalemia: Secondary | ICD-10-CM | POA: Diagnosis present

## 2023-09-16 DIAGNOSIS — M869 Osteomyelitis, unspecified: Secondary | ICD-10-CM | POA: Insufficient documentation

## 2023-09-16 DIAGNOSIS — Z7901 Long term (current) use of anticoagulants: Secondary | ICD-10-CM

## 2023-09-16 DIAGNOSIS — E785 Hyperlipidemia, unspecified: Secondary | ICD-10-CM | POA: Diagnosis present

## 2023-09-16 DIAGNOSIS — Z933 Colostomy status: Secondary | ICD-10-CM

## 2023-09-16 DIAGNOSIS — F3178 Bipolar disorder, in full remission, most recent episode mixed: Secondary | ICD-10-CM | POA: Diagnosis present

## 2023-09-16 DIAGNOSIS — R531 Weakness: Secondary | ICD-10-CM | POA: Diagnosis present

## 2023-09-16 DIAGNOSIS — S31000A Unspecified open wound of lower back and pelvis without penetration into retroperitoneum, initial encounter: Secondary | ICD-10-CM | POA: Diagnosis present

## 2023-09-16 DIAGNOSIS — I10 Essential (primary) hypertension: Secondary | ICD-10-CM | POA: Insufficient documentation

## 2023-09-16 DIAGNOSIS — L89154 Pressure ulcer of sacral region, stage 4: Secondary | ICD-10-CM | POA: Insufficient documentation

## 2023-09-16 DIAGNOSIS — N179 Acute kidney failure, unspecified: Secondary | ICD-10-CM | POA: Diagnosis present

## 2023-09-16 DIAGNOSIS — M4628 Osteomyelitis of vertebra, sacral and sacrococcygeal region: Secondary | ICD-10-CM | POA: Diagnosis present

## 2023-09-16 DIAGNOSIS — T148XXA Other injury of unspecified body region, initial encounter: Secondary | ICD-10-CM

## 2023-09-16 DIAGNOSIS — N1832 Chronic kidney disease, stage 3b: Secondary | ICD-10-CM | POA: Diagnosis present

## 2023-09-16 DIAGNOSIS — L97822 Non-pressure chronic ulcer of other part of left lower leg with fat layer exposed: Secondary | ICD-10-CM | POA: Insufficient documentation

## 2023-09-16 DIAGNOSIS — B964 Proteus (mirabilis) (morganii) as the cause of diseases classified elsewhere: Secondary | ICD-10-CM | POA: Diagnosis present

## 2023-09-16 DIAGNOSIS — Z79899 Other long term (current) drug therapy: Secondary | ICD-10-CM

## 2023-09-16 DIAGNOSIS — L89613 Pressure ulcer of right heel, stage 3: Secondary | ICD-10-CM | POA: Diagnosis present

## 2023-09-16 DIAGNOSIS — E1122 Type 2 diabetes mellitus with diabetic chronic kidney disease: Secondary | ICD-10-CM | POA: Diagnosis present

## 2023-09-16 DIAGNOSIS — N189 Chronic kidney disease, unspecified: Secondary | ICD-10-CM | POA: Diagnosis not present

## 2023-09-16 DIAGNOSIS — G3109 Other frontotemporal dementia: Secondary | ICD-10-CM | POA: Diagnosis present

## 2023-09-16 DIAGNOSIS — E872 Acidosis, unspecified: Secondary | ICD-10-CM | POA: Diagnosis present

## 2023-09-16 DIAGNOSIS — E66813 Obesity, class 3: Secondary | ICD-10-CM | POA: Diagnosis present

## 2023-09-16 DIAGNOSIS — F0390 Unspecified dementia without behavioral disturbance: Secondary | ICD-10-CM | POA: Diagnosis present

## 2023-09-16 DIAGNOSIS — L89159 Pressure ulcer of sacral region, unspecified stage: Secondary | ICD-10-CM | POA: Diagnosis present

## 2023-09-16 DIAGNOSIS — F411 Generalized anxiety disorder: Secondary | ICD-10-CM | POA: Diagnosis present

## 2023-09-16 DIAGNOSIS — D631 Anemia in chronic kidney disease: Secondary | ICD-10-CM | POA: Diagnosis present

## 2023-09-16 DIAGNOSIS — N319 Neuromuscular dysfunction of bladder, unspecified: Secondary | ICD-10-CM | POA: Diagnosis present

## 2023-09-16 DIAGNOSIS — Z818 Family history of other mental and behavioral disorders: Secondary | ICD-10-CM

## 2023-09-16 DIAGNOSIS — E1169 Type 2 diabetes mellitus with other specified complication: Secondary | ICD-10-CM | POA: Diagnosis present

## 2023-09-16 DIAGNOSIS — F0284 Dementia in other diseases classified elsewhere, unspecified severity, with anxiety: Secondary | ICD-10-CM | POA: Diagnosis present

## 2023-09-16 DIAGNOSIS — Z86718 Personal history of other venous thrombosis and embolism: Secondary | ICD-10-CM

## 2023-09-16 DIAGNOSIS — Z87820 Personal history of traumatic brain injury: Secondary | ICD-10-CM

## 2023-09-16 DIAGNOSIS — Z6841 Body Mass Index (BMI) 40.0 and over, adult: Secondary | ICD-10-CM

## 2023-09-16 DIAGNOSIS — I130 Hypertensive heart and chronic kidney disease with heart failure and stage 1 through stage 4 chronic kidney disease, or unspecified chronic kidney disease: Secondary | ICD-10-CM | POA: Diagnosis present

## 2023-09-16 DIAGNOSIS — I5032 Chronic diastolic (congestive) heart failure: Secondary | ICD-10-CM | POA: Diagnosis present

## 2023-09-16 DIAGNOSIS — M6281 Muscle weakness (generalized): Secondary | ICD-10-CM | POA: Insufficient documentation

## 2023-09-16 DIAGNOSIS — N390 Urinary tract infection, site not specified: Secondary | ICD-10-CM | POA: Diagnosis present

## 2023-09-16 DIAGNOSIS — S069XAA Unspecified intracranial injury with loss of consciousness status unknown, initial encounter: Secondary | ICD-10-CM | POA: Diagnosis present

## 2023-09-16 DIAGNOSIS — G8220 Paraplegia, unspecified: Secondary | ICD-10-CM | POA: Diagnosis present

## 2023-09-16 DIAGNOSIS — R262 Difficulty in walking, not elsewhere classified: Secondary | ICD-10-CM | POA: Diagnosis not present

## 2023-09-16 DIAGNOSIS — R2689 Other abnormalities of gait and mobility: Secondary | ICD-10-CM | POA: Diagnosis not present

## 2023-09-16 DIAGNOSIS — Z7984 Long term (current) use of oral hypoglycemic drugs: Secondary | ICD-10-CM

## 2023-09-16 DIAGNOSIS — F319 Bipolar disorder, unspecified: Secondary | ICD-10-CM | POA: Insufficient documentation

## 2023-09-16 DIAGNOSIS — N529 Male erectile dysfunction, unspecified: Secondary | ICD-10-CM | POA: Diagnosis present

## 2023-09-16 DIAGNOSIS — E1129 Type 2 diabetes mellitus with other diabetic kidney complication: Secondary | ICD-10-CM | POA: Diagnosis present

## 2023-09-16 DIAGNOSIS — R339 Retention of urine, unspecified: Secondary | ICD-10-CM | POA: Diagnosis present

## 2023-09-16 DIAGNOSIS — L089 Local infection of the skin and subcutaneous tissue, unspecified: Principal | ICD-10-CM

## 2023-09-16 DIAGNOSIS — Z8619 Personal history of other infectious and parasitic diseases: Secondary | ICD-10-CM

## 2023-09-16 LAB — COMPREHENSIVE METABOLIC PANEL
ALT: 19 U/L (ref 0–44)
AST: 22 U/L (ref 15–41)
Albumin: 3 g/dL — ABNORMAL LOW (ref 3.5–5.0)
Alkaline Phosphatase: 69 U/L (ref 38–126)
Anion gap: 9 (ref 5–15)
BUN: 49 mg/dL — ABNORMAL HIGH (ref 8–23)
CO2: 14 mmol/L — ABNORMAL LOW (ref 22–32)
Calcium: 8.6 mg/dL — ABNORMAL LOW (ref 8.9–10.3)
Chloride: 111 mmol/L (ref 98–111)
Creatinine, Ser: 2.85 mg/dL — ABNORMAL HIGH (ref 0.61–1.24)
GFR, Estimated: 23 mL/min — ABNORMAL LOW (ref >60)
Glucose, Bld: 131 mg/dL — ABNORMAL HIGH (ref 70–99)
Potassium: 5.5 mmol/L — ABNORMAL HIGH (ref 3.5–5.1)
Sodium: 134 mmol/L — ABNORMAL LOW (ref 135–145)
Total Bilirubin: 0.6 mg/dL (ref 0.0–1.2)
Total Protein: 7.9 g/dL (ref 6.5–8.1)

## 2023-09-16 LAB — URINALYSIS, W/ REFLEX TO CULTURE (INFECTION SUSPECTED)
Bilirubin Urine: NEGATIVE
Glucose, UA: NEGATIVE mg/dL
Ketones, ur: NEGATIVE mg/dL
Nitrite: NEGATIVE
Protein, ur: >=300 mg/dL — AB
RBC / HPF: >50 RBC/hpf (ref 0–5)
Specific Gravity, Urine: 1.015 (ref 1.005–1.030)
pH: 8 (ref 5.0–8.0)

## 2023-09-16 LAB — CBC
HCT: 25.6 % — ABNORMAL LOW (ref 39.0–52.0)
Hemoglobin: 8 g/dL — ABNORMAL LOW (ref 13.0–17.0)
MCH: 27.8 pg (ref 26.0–34.0)
MCHC: 31.3 g/dL (ref 30.0–36.0)
MCV: 88.9 fL (ref 80.0–100.0)
Platelets: 215 10*3/uL (ref 150–400)
RBC: 2.88 MIL/uL — ABNORMAL LOW (ref 4.22–5.81)
RDW: 17.3 % — ABNORMAL HIGH (ref 11.5–15.5)
WBC: 7.7 10*3/uL (ref 4.0–10.5)
nRBC: 0 % (ref 0.0–0.2)

## 2023-09-16 LAB — TROPONIN I (HIGH SENSITIVITY)
Troponin I (High Sensitivity): 8 ng/L (ref <18)
Troponin I (High Sensitivity): 9 ng/L (ref <18)

## 2023-09-16 LAB — VANCOMYCIN, RANDOM: Vancomycin Rm: 15 ug/mL

## 2023-09-16 LAB — PROTIME-INR
INR: 1.5 — ABNORMAL HIGH (ref 0.8–1.2)
Prothrombin Time: 18.2 s — ABNORMAL HIGH (ref 11.4–15.2)

## 2023-09-16 LAB — GLUCOSE, CAPILLARY: Glucose-Capillary: 165 mg/dL — ABNORMAL HIGH (ref 70–99)

## 2023-09-16 LAB — SEDIMENTATION RATE: Sed Rate: >140 mm/h — ABNORMAL HIGH (ref 0–20)

## 2023-09-16 MED ORDER — TRANEXAMIC ACID FOR EPISTAXIS
500.0000 mg | Freq: Once | TOPICAL | Status: AC
  Administered 2023-09-16: 500 mg via TOPICAL
  Filled 2023-09-16: qty 10
  Filled 2023-09-16: qty 5

## 2023-09-16 MED ORDER — ONDANSETRON HCL 4 MG/2ML IJ SOLN: 4.0000 mg | Freq: Four times a day (QID) | INTRAMUSCULAR | Status: DC | PRN

## 2023-09-16 MED ORDER — INSULIN ASPART 100 UNIT/ML IJ SOLN
0.0000 [IU] | Freq: Every day | INTRAMUSCULAR | Status: DC
  Administered 2023-09-18: 2 [IU] via SUBCUTANEOUS
  Filled 2023-09-16: qty 1

## 2023-09-16 MED ORDER — MEMANTINE HCL 5 MG PO TABS
10.0000 mg | ORAL_TABLET | Freq: Two times a day (BID) | ORAL | Status: DC
  Administered 2023-09-16 – 2023-09-21 (×10): 10 mg via ORAL
  Filled 2023-09-16 (×10): qty 2

## 2023-09-16 MED ORDER — OXCARBAZEPINE 300 MG PO TABS
300.0000 mg | ORAL_TABLET | Freq: Two times a day (BID) | ORAL | Status: DC
  Administered 2023-09-16 – 2023-09-21 (×10): 300 mg via ORAL
  Filled 2023-09-16 (×10): qty 1

## 2023-09-16 MED ORDER — VITAMIN B-12 1000 MCG PO TABS
1000.0000 ug | ORAL_TABLET | Freq: Every day | ORAL | Status: DC
  Administered 2023-09-17 – 2023-09-21 (×5): 1000 ug via ORAL
  Filled 2023-09-16 (×5): qty 1

## 2023-09-16 MED ORDER — ACETAMINOPHEN 650 MG RE SUPP: 650.0000 mg | Freq: Four times a day (QID) | RECTAL | Status: DC | PRN

## 2023-09-16 MED ORDER — SODIUM CHLORIDE 0.9 % IV SOLN: INTRAVENOUS | Status: AC

## 2023-09-16 MED ORDER — PIPERACILLIN-TAZOBACTAM 3.375 G IVPB
3.3750 g | Freq: Once | INTRAVENOUS | Status: AC
  Administered 2023-09-16: 3.375 g via INTRAVENOUS
  Filled 2023-09-16: qty 50

## 2023-09-16 MED ORDER — VANCOMYCIN HCL 1500 MG/300ML IV SOLN
1500.0000 mg | Freq: Once | INTRAVENOUS | Status: AC
  Administered 2023-09-16: 1500 mg via INTRAVENOUS
  Filled 2023-09-16: qty 300

## 2023-09-16 MED ORDER — VANCOMYCIN HCL IN DEXTROSE 1-5 GM/200ML-% IV SOLN
1000.0000 mg | Freq: Once | INTRAVENOUS | Status: AC
  Administered 2023-09-16: 1000 mg via INTRAVENOUS
  Filled 2023-09-16: qty 200

## 2023-09-16 MED ORDER — INSULIN ASPART 100 UNIT/ML IJ SOLN
0.0000 [IU] | Freq: Three times a day (TID) | INTRAMUSCULAR | Status: DC
  Administered 2023-09-17 – 2023-09-19 (×3): 1 [IU] via SUBCUTANEOUS
  Filled 2023-09-16 (×2): qty 1

## 2023-09-16 MED ORDER — SODIUM CHLORIDE 0.9 % IV SOLN
2.0000 g | Freq: Two times a day (BID) | INTRAVENOUS | Status: DC
  Administered 2023-09-16 – 2023-09-18 (×4): 2 g via INTRAVENOUS
  Filled 2023-09-16 (×5): qty 12.5

## 2023-09-16 MED ORDER — SODIUM BICARBONATE 650 MG PO TABS
650.0000 mg | ORAL_TABLET | Freq: Two times a day (BID) | ORAL | Status: AC
  Administered 2023-09-16 – 2023-09-17 (×4): 650 mg via ORAL
  Filled 2023-09-16 (×4): qty 1

## 2023-09-16 MED ORDER — SODIUM ZIRCONIUM CYCLOSILICATE 10 G PO PACK
10.0000 g | PACK | Freq: Once | ORAL | Status: AC
  Administered 2023-09-16: 10 g via ORAL
  Filled 2023-09-16 (×2): qty 1

## 2023-09-16 MED ORDER — LORAZEPAM 0.5 MG PO TABS: 0.5000 mg | ORAL_TABLET | Freq: Four times a day (QID) | ORAL | Status: DC | PRN

## 2023-09-16 MED ORDER — VANCOMYCIN VARIABLE DOSE PER UNSTABLE RENAL FUNCTION (PHARMACIST DOSING): Status: DC

## 2023-09-16 MED ORDER — HYDRALAZINE HCL 20 MG/ML IJ SOLN: 5.0000 mg | Freq: Four times a day (QID) | INTRAMUSCULAR | Status: DC | PRN

## 2023-09-16 MED ORDER — QUETIAPINE FUMARATE 25 MG PO TABS: 25.0000 mg | ORAL_TABLET | Freq: Two times a day (BID) | ORAL | Status: DC | PRN

## 2023-09-16 MED ORDER — ACETAMINOPHEN 325 MG PO TABS: 650.0000 mg | ORAL_TABLET | Freq: Four times a day (QID) | ORAL | Status: DC | PRN

## 2023-09-16 MED ORDER — ONDANSETRON HCL 4 MG PO TABS: 4.0000 mg | ORAL_TABLET | Freq: Four times a day (QID) | ORAL | Status: DC | PRN

## 2023-09-16 MED ORDER — ROSUVASTATIN CALCIUM 20 MG PO TABS
20.0000 mg | ORAL_TABLET | Freq: Every day | ORAL | Status: DC
  Administered 2023-09-16 – 2023-09-20 (×5): 20 mg via ORAL
  Filled 2023-09-16 (×6): qty 1

## 2023-09-16 NOTE — Assessment & Plan Note (Signed)
 Wound/colostomy care ordered

## 2023-09-16 NOTE — Assessment & Plan Note (Signed)
 Insulin SSI with at bedtime coverage ordered

## 2023-09-16 NOTE — Assessment & Plan Note (Signed)
 -  This complicates overall care and prognosis.

## 2023-09-16 NOTE — Assessment & Plan Note (Addendum)
 Suspect secondary to acute kidney injury Status post Lokelma 10 g p.o. one-time dose per EDP Recheck BMP in the a.m.

## 2023-09-16 NOTE — ED Notes (Signed)
 CCMD contacted and pt placed on monitoring system

## 2023-09-16 NOTE — Progress Notes (Signed)
 Pharmacy Antibiotic Note  [name] is a 68 y.o. male admitted on 09/16/2023 with cellulitis and wound infection .  Patient has a wound to his buttocks area that was bleeding on evaluation at wound clinic and was advised to come to ER. Pharmacy has been consulted for vancomycin and cefepime dosing.  S/p vanc 2.5 g IV x 1 and Zosyn 3.375 g IV x 1  Plan: S/p vancomycin 2500 mg IV x1. Check random vanc level tomorrow given AKI (SCr 2.85, baseline 2.1-2.3) Start cefepime 2 g IV Q12H Continue to monitor renal function and follow culture results   Height: 5\' 6"  (167.6 cm) Weight: 126.6 kg (279 lb) IBW/kg (Calculated) : 63.8  Temp (24hrs), Avg:97.8 F (36.6 C), Min:97.8 F (36.6 C), Max:97.8 F (36.6 C)  Recent Labs  Lab 09/16/23 1123 09/16/23 1241  WBC  --  7.7  CREATININE 2.85*  --     Estimated Creatinine Clearance: 31.2 mL/min (A) (by C-G formula based on SCr of 2.85 mg/dL (H)).    No Known Allergies  Antimicrobials this admission: 3/18 Vanc >>  3/18 Cefepime>> 3/18 Zosyn x 1  Microbiology results: None  Thank you for allowing pharmacy to be a part of this patient's care.  Merryl [name], PharmD Clinical Pharmacist  09/16/2023 2:40 PM

## 2023-09-16 NOTE — Assessment & Plan Note (Signed)
 Sodium chloride infusion at 125 mL/h, 1 day ordered, recheck BMP in a.m.

## 2023-09-16 NOTE — Assessment & Plan Note (Signed)
-   Lorazepam 0.5 mg p.o. every 6 hours as needed for anxiety, 2 doses ordered 

## 2023-09-16 NOTE — H&P (Addendum)
 History and Physical   [name] YQM:[id number] DOB: 1956-05-20 DOA: 09/16/2023  PCP: Dione Booze, MD  Patient coming from: Wound clinic  I have personally briefly reviewed patient's old medical records in [facility] EMR.  Chief Concern: Sacral wound bleeding  HPI: [name] is a 68 year old male with history of morbid obesity, bipolar, CKD 3B, history of DVT on Eliquis, neurogenic bladder requiring catheter,, who presents emergency department for chief concerns of sacral/buttock wound bleeding.  Vitals in the ED showed temperature 97.8, respiration rate of 16, heart rate 81, blood pressure 100/75, SpO2 100% on room air.  Serum sodium is 134, potassium 5.5, chloride 111, bicarb 14, BUN 49, serum creatinine 2.85, EGFR 23, nonfasting blood glucose 131, WBC 7.7, hemoglobin 8.0, platelets of 215.  Sed rate is greater than 140.  Initial high high-sensitivity troponin was 8.  ED treatment: Lokelma 10 mg p.o. one-time dose, TXA 500 mg TP, vancomycin, Zosyn. ----------------------------------- At bedside, patient is able to tell me his first and last name, age, location, current calendar year.  He reports he was sent to the hospital by wound clinic due to bleeding from his sacral area.  He reports he had a bone biopsy yesterday outpatient due to concerns of osteomyelitis.  He reports that he continued on Eliquis before and after bone biopsy without being told to stop Eliquis.  He reports his last dose of Eliquis was this morning prior to ED presentation.  He denies fever, chills, chest pain, shortness of breath and/or changes to urinary output from the Foley bag.  He denies changes to colostomy output.  He reports he has had a colostomy bag for the last 2 to 3 years.  At bedside, his strength is 1/5 of the bilateral lower extremities. He clearly makes attempt to flex his knee and the result is not a full ROM. He reports this strength level is more than his  baseline.  Social history: He lives at home with his wife.  He denies tobacco, EtOH, recreational drug use.  He is disabled and retired.  ROS: Constitutional: no weight change, no fever ENT/Mouth: no sore throat, no rhinorrhea Eyes: no eye pain, no vision changes Cardiovascular: no chest pain, no dyspnea,  no edema, no palpitations Respiratory: no cough, no sputum, no wheezing Gastrointestinal: no nausea, no vomiting, no diarrhea, no constipation Genitourinary: no urinary incontinence, no dysuria, no hematuria Musculoskeletal: no arthralgias, no myalgias Skin: no skin lesions, no pruritus, bleeding from sacral ulcer Neuro: no weakness, no loss of consciousness, no syncope Psych: no anxiety, no depression, no decrease appetite Heme/Lymph: no bruising, no bleeding  ED Course: Discussed with EDP, patient requiring hospitalization for chief concerns of AKI.  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 one-time dose per EDP Recheck BMP in the a.m.  Sacral osteomyelitis (HCC) Acute on chronic with bleed Status post  TXA Blood cultures x 2 are ordered on admission in setting of history of MSSA bacteremia General Surgery has been consulted via secure chat and epic order, to Dr. Maia Plan  Metabolic acidosis Suspect secondary to acute kidney injury and CKD, treatment per above Sodium bicarbonate tablet 650 mg p.o. twice daily, 2 days ordered Recheck BMP in a.m.  Essential hypertension Home amlodipine-olmesartan 5-20 mg tablet daily not resumed on admission in setting of acute kidney injury Hydralazine 5 mg IV every 6 hours as needed for SBP greater than 165, 5 days ordered  AKI (acute kidney injury) (HCC) Sodium chloride infusion at 125 mL/h, 1 day ordered, recheck BMP in a.m.  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 twice daily resumed Quetiapine 25 mg p.o. twice daily as needed for agitation resumed on admission  Obesity, Class III, BMI 40-49.9 (morbid obesity) (HCC) This complicates overall care and prognosis.   Bipolar disorder, in full remission, most recent episode mixed (HCC) Home oxcarbamazepine 300 mg p.o. twice daily resumed  GAD (generalized anxiety disorder) Lorazepam 0.5 mg p.o. every 6 hours as needed for anxiety, 2 doses ordered  Chart reviewed.   DVT prophylaxis: Pharmacologic DVT prophylaxis not initiated on admission in setting of sacral wound bleeding status with bone biopsy while on Eliquis outpatient.  AM team to reinitiate pharmacologic DVT prophylaxis when the benefits outweigh the risk Code Status: Full code Diet: Renal Family Communication: No, his wife knows he is being admitted to the hospital Disposition  Plan: Pending clinical course, guarded prognosis Consults called: Pharmacy Admission status: Telemetry medical, inpatient  Past Medical History:  Diagnosis Date   Acute exacerbation of CHF (congestive heart failure) (HCC)    Anemia    Bipolar disorder (HCC)    Bladder mass    Cellulitis    Chronic anticoagulation    Chronic indwelling Foley catheter    CKD (chronic kidney disease) stage 3, GFR 30-59 ml/min (HCC)    Colostomy in place (HCC)    Diabetes mellitus without complication (HCC)    Frontotemporal dementia (HCC)    Heel ulcer (HCC)    Left   Hematuria    History of blood clots    Hydronephrosis of right kidney    Hyperkalemia    Hypertension    Hyponatremia    Lithium toxicity    Metabolic acidosis    Neurogenic bladder    Obesity    Sacral decubitus ulcer    Sepsis (HCC)    TBI (traumatic brain injury) (HCC)    UTI (urinary tract infection)    VRE (vancomycin resistant enterococcus) culture positive    Past Surgical History:  Procedure Laterality Date   BACK SURGERY     CARPAL TUNNEL RELEASE Bilateral    COLON SURGERY     COLOSTOMY     CYSTOSCOPY W/ RETROGRADES Right 10/25/2022   Procedure: CYSTOSCOPY WITH RETROGRADE PYELOGRAM;  Surgeon: Sondra Come, MD;  Location: [facility] ORS;  Service: Urology;  Laterality:  Right;   IR PERC TUN PERIT CATH WO PORT S&I /IMAG  10/11/2021   IR REMOVAL TUN CV CATH W/O FL  11/19/2021   IR US GUIDE VASC ACCESS RIGHT  10/11/2021   SACRAL DECUBITUS ULCER EXCISION     TONSILLECTOMY     Social History:  reports that he has never smoked. He has never been exposed to tobacco smoke. He has never used smokeless tobacco. He reports that he does not currently use alcohol. He reports that he does not use drugs.  No Known Allergies Family History  Problem Relation Age of Onset   Depression Father    Deep vein thrombosis Father    Family history: Family history reviewed and not pertinent.  Prior to Admission medications   Medication  Sig Start Date End Date Taking? Authorizing Provider  amLODipine-olmesartan (AZOR) 5-20 MG tablet Take 1 tablet by mouth daily.    [provider]  Continuous Blood Gluc Receiver (DEXCOM G7 RECEIVER) DEVI UAD for blood sugar monitoring. 01/23/22   [provider]  ELIQUIS 5 MG TABS tablet Take 5 mg by mouth 2 (two) times daily.  11/17/18   [provider]  glipiZIDE (GLUCOTROL XL) 2.5 MG 24 hr tablet Take 2.5 mg by mouth daily. 02/04/23   [provider]  memantine (NAMENDA) 10 MG tablet Take 10 mg by mouth 2 (two) times daily.    [provider]  Oxcarbazepine (TRILEPTAL) 300 MG tablet Take 1 tablet by mouth twice daily 07/25/23   [name], MD  QUEtiapine (SEROQUEL) 25 MG tablet TAKE 1 TO 2 TABLETS BY MOUTH ONCE DAILY AS NEEDED Patient taking differently: Take 25 mg by mouth 2 (two) times daily as needed (agitation). 02/23/23   [name], MD  rosuvastatin (CRESTOR) 20 MG tablet Take 20 mg by mouth at bedtime. 08/29/17   [provider]  vitamin B-12 (CYANOCOBALAMIN) 1000 MCG tablet Take 1,000 mcg by mouth daily.    [provider]   Physical Exam: Vitals:   09/16/23 1345 09/16/23 1400 09/16/23 1649 09/16/23 1730  BP: (!) 122/55 124/66  112/69  Pulse:  (!) 58  65  Resp: 15 14  16   Temp:   98.2 F (36.8 C) 97.7 F (36.5 C)  TempSrc:   Axillary   SpO2: 96%   92%  Weight:      Height:       Constitutional: appears age-appropriate, frail, chronically ill Eyes: PERRL, lids and conjunctivae normal ENMT: Mucous membranes are moist. Posterior pharynx clear of any exudate or lesions. Age-appropriate dentition. Hearing appropriate Neck: normal, supple, no masses, no thyromegaly Respiratory: clear to auscultation bilaterally, no wheezing, no crackles. Normal respiratory effort. No accessory muscle use.  Cardiovascular: Regular rate and rhythm, no murmurs / rubs / gallops. No extremity edema. 2+ pedal pulses. No carotid bruits.   Abdomen: Morbidly obese abdomen, no tenderness, no masses palpated, no hepatosplenomegaly. Bowel sounds positive.  Musculoskeletal: no clubbing / cyanosis. No joint deformity upper and lower extremities. Good ROM, no contractures, no atrophy. Normal muscle tone.  Skin: no rashes, lesions, ulcers. No induration Neurologic: Sensation intact. Strength 4 out of 5 in bilateral upper extremity and 1 out of 5 in bilateral lower extremity Psychiatric: Normal judgment and insight. Alert and oriented x 3.  Depressed mood.  Flat affect  EKG: independently reviewed, showing sinus rhythm with rate of 62, QTc 416  X-ray on Admission: I personally reviewed and I agree with radiologist reading as below.  CT ABDOMEN PELVIS WO  CONTRAST Result Date: 09/16/2023 CLINICAL DATA:  Sacral wound. EXAM: CT ABDOMEN AND PELVIS WITHOUT CONTRAST TECHNIQUE: Multidetector CT imaging of the abdomen and pelvis was performed following the standard protocol without IV contrast. RADIATION DOSE REDUCTION: This exam was performed according to the departmental dose-optimization program which includes automated exposure control, adjustment of the mA and/or kV according to patient size and/or use of iterative reconstruction technique. COMPARISON:  February 27, 2023. FINDINGS: Lower chest: No acute abnormality. Hepatobiliary: No focal liver abnormality is seen. No gallstones, gallbladder wall thickening, or biliary dilatation. Pancreas: Unremarkable. No pancreatic ductal dilatation or surrounding inflammatory changes. Spleen: Normal in size without focal abnormality. Adrenals/Urinary Tract: Adrenal glands are unremarkable. Kidneys are normal, without renal calculi, focal lesion, or hydronephrosis. Bladder is unremarkable. Stomach/Bowel: Stomach is unremarkable. Loop colostomy is noted in left lower quadrant. There is no evidence of bowel obstruction or inflammation. The appendix appears normal. Vascular/Lymphatic: Aortic atherosclerosis. No  enlarged abdominal or pelvic lymph nodes. Reproductive: Mildly enlarged prostate gland is noted. Foley catheter balloon has been inflated within the gland. Repositioning is recommended. Other: No ascites is noted. 5.4 x 3.3 cm ulceration is seen extending from inferior buttocks region 2 inferior portion of coccyx. Contains debris consistent with abscess. There appears to be some degree of lytic destruction involving the coccyx suggesting osteomyelitis. Musculoskeletal: As noted above, there appears to be some degree of lytic destruction involving the coccyx suggesting osteomyelitis. IMPRESSION: Mildly enlarged prostate gland is noted. Foley catheter balloon is inflated within the gland. Repositioning is recommended. 5.4 x 3.3 cm soft tissue ulceration or wound is seen extending from inferior buttocks regions toward inferior portion of coccyx. It contains debris consistent with abscess. There does appear to be some degree of lytic destruction involving the adjacent coccyx suggesting osteomyelitis. Aortic Atherosclerosis (ICD10-I70.0). Electronically Signed   By: Lupita Raider M.D.   On: 09/16/2023 15:32   DG Sacrum/Coccyx Result Date: 09/16/2023 CLINICAL DATA:  Sacral wound.  Bone infection. EXAM: SACRUM AND COCCYX - 2+ VIEW COMPARISON:  Pelvic radiograph dated 12/16/2021. FINDINGS: No acute fracture or dislocation. The bones are osteopenic. Severe arthritic changes of the hips. The soft tissues are grossly unremarkable. Atherosclerotic calcification of the aorta. IMPRESSION: 1. No acute fracture or dislocation. 2. Severe arthritic changes of the hips. Electronically Signed   By: Elgie Collard M.D.   On: 09/16/2023 12:03   Labs on Admission: I have personally reviewed following labs  CBC: Recent Labs  Lab 09/16/23 1241  WBC 7.7  HGB 8.0*  HCT 25.6*  MCV 88.9  PLT 215   Basic Metabolic Panel: Recent Labs  Lab 09/16/23 1123  NA 134*  K 5.5*  CL 111  CO2 14*  GLUCOSE 131*  BUN 49*   CREATININE 2.85*  CALCIUM 8.6*   GFR: Estimated Creatinine Clearance: 31.2 mL/min (A) (by C-G formula based on SCr of 2.85 mg/dL (H)).  Liver Function Tests: Recent Labs  Lab 09/16/23 1123  AST 22  ALT 19  ALKPHOS 69  BILITOT 0.6  PROT 7.9  ALBUMIN 3.0*   Coagulation Profile: Recent Labs  Lab 09/16/23 1241  INR 1.5*   Urine analysis:    Component Value Date/Time   COLORURINE AMBER (A) 09/16/2023 1524   APPEARANCEUR CLOUDY (A) 09/16/2023 1524   APPEARANCEUR Cloudy (A) 07/18/2023 1211   LABSPEC 1.015 09/16/2023 1524   PHURINE 8.0 09/16/2023 1524   GLUCOSEU NEGATIVE 09/16/2023 1524   HGBUR SMALL (A) 09/16/2023 1524   BILIRUBINUR NEGATIVE 09/16/2023 1524  BILIRUBINUR Negative 07/18/2023 1211   KETONESUR NEGATIVE 09/16/2023 1524   PROTEINUR >=300 (A) 09/16/2023 1524   NITRITE NEGATIVE 09/16/2023 1524   LEUKOCYTESUR LARGE (A) 09/16/2023 1524   CRITICAL CARE Performed by: Dr. Sedalia Muta  Total critical care time: 32 minutes  Critical care time was exclusive of separately billable procedures and treating other patients.  Critical care was necessary to treat or prevent imminent or life-threatening deterioration.  Critical care was time spent personally by me on the following activities: development of treatment plan with patient and/or surrogate as well as nursing, discussions with consultants, evaluation of patient's response to treatment, examination of patient, obtaining history from patient or surrogate, ordering and performing treatments and interventions, ordering and review of laboratory studies, ordering and review of radiographic studies, pulse oximetry and re-evaluation of patient's condition.  This document was prepared using Dragon Voice Recognition software and may include unintentional dictation errors.  Dr. Sedalia Muta [facility]  If 7PM-7AM, please contact overnight-coverage provider If 7AM-7PM, please contact day attending  provider [email or web address]  09/16/2023, 6:10 PM

## 2023-09-16 NOTE — ED Notes (Signed)
 Dr Jodie Echevaria at bedside to pack wound. 4 surgicel and 2 gauze pads with tranexamic acid placed. Pt buttock macerated but no signs of infection per Dr. Jodie Echevaria.

## 2023-09-16 NOTE — Assessment & Plan Note (Signed)
 Home Eliquis held on admission in setting of sacral wound bleeding requiring TXA and wound packed

## 2023-09-16 NOTE — Progress Notes (Signed)
 ED Pharmacy Antibiotic Sign Off An antibiotic consult was received from an ED provider for vancomycin and Zosyn per pharmacy dosing for wound infection. A chart review was completed to assess appropriateness.   The following one time order(s) were placed:  Vanc 2.5 g IV x 1 Zosyn 3.375 g IV x 1  Further antibiotic and/or antibiotic pharmacy consults should be ordered by the admitting provider if indicated.   Thank you for allowing pharmacy to be a part of this patient's care.   Merryl [name], [facility]  Clinical Pharmacist 09/16/23 2:43 PM

## 2023-09-16 NOTE — Assessment & Plan Note (Signed)
 Memantine 10 mg p.o. twice daily resumed Quetiapine 25 mg p.o. twice daily as needed for agitation resumed on admission

## 2023-09-16 NOTE — Consult Note (Signed)
 SURGICAL CONSULTATION NOTE   HISTORY OF PRESENT ILLNESS (HPI):  68 y.o. male presented to [facility] ED for evaluation of bleeding wound. Patient reports he went to the wound clinic and the wound was bleeding so he was sent to the ED. At the ED he had a ct scan of pelvis that reported an abscess. Surgery consulted for evaluation of abscess.   Surgery is consulted by Dr. Sedalia Muta in this context for evaluation and management of sacral ulcer abscess.  PAST MEDICAL HISTORY (PMH):  Past Medical History:  Diagnosis Date   Acute exacerbation of CHF (congestive heart failure) (HCC)    Anemia    Bipolar disorder (HCC)    Bladder mass    Cellulitis    Chronic anticoagulation    Chronic indwelling Foley catheter    CKD (chronic kidney disease) stage 3, GFR 30-59 ml/min (HCC)    Colostomy in place (HCC)    Diabetes mellitus without complication (HCC)    Frontotemporal dementia (HCC)    Heel ulcer (HCC)    Left   Hematuria    History of blood clots    Hydronephrosis of right kidney    Hyperkalemia    Hypertension    Hyponatremia    Lithium toxicity    Metabolic acidosis    Neurogenic bladder    Obesity    Sacral decubitus ulcer    Sepsis (HCC)    TBI (traumatic brain injury) (HCC)    UTI (urinary tract infection)    VRE (vancomycin resistant enterococcus) culture positive      PAST SURGICAL HISTORY (PSH):  Past Surgical History:  Procedure Laterality Date   BACK SURGERY     CARPAL TUNNEL RELEASE Bilateral    COLON SURGERY     COLOSTOMY     CYSTOSCOPY W/ RETROGRADES Right 10/25/2022   Procedure: CYSTOSCOPY WITH RETROGRADE PYELOGRAM;  Surgeon: Sondra Come, MD;  Location: [facility] ORS;  Service: Urology;  Laterality: Right;   IR PERC TUN PERIT CATH WO PORT S&I /IMAG  10/11/2021   IR REMOVAL TUN CV CATH W/O FL  11/19/2021   IR US GUIDE VASC ACCESS RIGHT  10/11/2021   SACRAL DECUBITUS ULCER EXCISION     TONSILLECTOMY       MEDICATIONS:  Prior to Admission medications   Medication  Sig Start Date End Date Taking? Authorizing Provider  amLODipine-olmesartan (AZOR) 5-20 MG tablet Take 1 tablet by mouth daily.    [provider]  Continuous Blood Gluc Receiver (DEXCOM G7 RECEIVER) DEVI UAD for blood sugar monitoring. 01/23/22   [provider]  ELIQUIS 5 MG TABS tablet Take 5 mg by mouth 2 (two) times daily.  11/17/18   [provider]  glipiZIDE (GLUCOTROL XL) 2.5 MG 24 hr tablet Take 2.5 mg by mouth daily. 02/04/23   [provider]  memantine (NAMENDA) 10 MG tablet Take 10 mg by mouth 2 (two) times daily.    [provider]  Oxcarbazepine (TRILEPTAL) 300 MG tablet Take 1 tablet by mouth twice daily 07/25/23   [name], MD  QUEtiapine (SEROQUEL) 25 MG tablet TAKE 1 TO 2 TABLETS BY MOUTH ONCE DAILY AS NEEDED Patient taking differently: Take 25 mg by mouth 2 (two) times daily as needed (agitation). 02/23/23   [name], MD  rosuvastatin (CRESTOR) 20 MG tablet Take 20 mg by mouth at bedtime. 08/29/17   [provider]  vitamin B-12 (CYANOCOBALAMIN) 1000 MCG tablet Take 1,000 mcg by mouth daily.    [provider]     ALLERGIES:  No Known Allergies   SOCIAL HISTORY:  Social History   Socioeconomic History   Marital status: Married    Spouse name: thelma   Number of children: 0   Years of education: Not on file   Highest education level: Associate degree: occupational, Scientist, product/process development, or vocational program  Occupational History   Not on file  Tobacco Use   Smoking status: Never    Passive exposure: Never   Smokeless tobacco: Never  Vaping Use   Vaping status: Never Used  Substance and Sexual Activity   Alcohol use: Not Currently   Drug use: No   Sexual activity: Not Currently  Other Topics Concern   Not on file  Social History Narrative   Not on file   Social Drivers of Health   Financial Resource Strain: Low Risk  (12/31/2020)   Received from [facility], [facility]   Overall  Financial Resource Strain (CARDIA)    Difficulty of Paying Living Expenses: Not very hard  Food Insecurity: Patient Declined (09/16/2023)   Hunger Vital Sign    Worried About Running Out of Food in the Last Year: Patient declined    Ran Out of Food in the Last Year: Patient declined  Transportation Needs: Patient Declined (09/16/2023)   PRAPARE - Administrator, Civil Service (Medical): Patient declined    Lack of Transportation (Non-Medical): Patient declined  Physical Activity: Inactive (09/09/2017)   Exercise Vital Sign    Days of Exercise per Week: 0 days    Minutes of Exercise per Session: 0 min  Stress: No Stress Concern Present (09/09/2017)   [employer] of Occupational Health - Occupational Stress Questionnaire    Feeling of Stress : Not at all  Social Connections: Patient Declined (07/19/2023)   Social Connection and Isolation Panel [NHANES]    Frequency of Communication with Friends and Family: Patient declined    Frequency of Social Gatherings with Friends and Family: Patient declined    Attends Religious Services: Patient declined    Active Member of Clubs or Organizations: Patient declined    Attends Banker Meetings: Patient declined    Marital Status: Patient declined  Intimate Partner Violence: Patient Declined (09/16/2023)   Humiliation, Afraid, Rape, and Kick questionnaire    Fear of Current or Ex-Partner: Patient declined    Emotionally Abused: Patient declined    Physically Abused: Patient declined    Sexually Abused: Patient declined      FAMILY HISTORY:  Family History  Problem Relation Age of Onset   Depression Father    Deep vein thrombosis Father      REVIEW OF SYSTEMS:  Constitutional: denies weight loss, fever, chills, or sweats  Eyes: denies any other vision changes, history of eye injury  ENT: denies sore throat, hearing problems  Respiratory: denies shortness of breath, wheezing  Cardiovascular: denies chest pain,  palpitations  Gastrointestinal: denies abdominal pain, nausea and vomiting Genitourinary: denies burning with urination or urinary frequency Musculoskeletal: positive any other joint pains or cramps  Skin: denies any other rashes or skin discolorations  Neurological: denies any other headache, dizziness, weakness  Psychiatric: denies any other depression, anxiety   All other review of systems were negative   VITAL SIGNS:  Temp:  [97.7 F (36.5 C)-98.2 F (36.8 C)] 97.7 F (36.5 C) (03/18 1730) Pulse Rate:  [58-81] 65 (03/18 1730) Resp:  [14-16] 16 (03/18 1730) BP: (100-124)/(55-75) 112/69 (03/18 1730) SpO2:  [92 %-100 %]  92 % (03/18 1730) Weight:  [126.6 kg] 126.6 kg (03/18 1119)     Height: 5\' 6"  (167.6 cm) Weight: 126.6 kg BMI (Calculated): 45.05   INTAKE/OUTPUT:  This shift: No intake/output data recorded.  Last 2 shifts: @IOLAST2SHIFTS @   PHYSICAL EXAM:  Constitutional:  -- Normal body habitus  -- Awake, alert, and oriented x3  Eyes:  -- Pupils equally round and reactive to light  -- No scleral icterus  Ear, nose, and throat:  -- No jugular venous distension  Pulmonary:  -- No crackles  -- Equal breath sounds bilaterally -- Breathing non-labored at rest Cardiovascular:  -- S1, S2 present  -- No pericardial rubs Gastrointestinal:  -- Abdomen soft, nontender, non-distended, no guarding or rebound tenderness -- No abdominal masses appreciated, pulsatile or otherwise  Musculoskeletal and Integumentary:  -- Wounds: sacral wound packed with gauze and Surgicel. No pus or necrotic tissue -- Extremities: no deformities Neurologic:  -- Motor function: decrease motor with weakness of lower extremities -- Sensation: decreased sensation.    Labs:     Latest Ref Rng & Units 09/16/2023   12:41 PM 07/22/2023    4:39 AM 07/21/2023    4:19 AM  CBC  WBC 4.0 - 10.5 K/uL 7.7  6.1  7.2   Hemoglobin 13.0 - 17.0 g/dL 8.0  8.0  7.6   Hematocrit 39.0 - 52.0 % 25.6  25.8  24.4    Platelets 150 - 400 K/uL 215  222  201       Latest Ref Rng & Units 09/16/2023   11:23 AM 07/22/2023    4:39 AM 07/21/2023    4:19 AM  CMP  Glucose 70 - 99 mg/dL 295  621  308   BUN 8 - 23 mg/dL 49  39  43   Creatinine 0.61 - 1.24 mg/dL 6.57  8.46  9.62   Sodium 135 - 145 mmol/L 134  135  132   Potassium 3.5 - 5.1 mmol/L 5.5  5.2  5.2   Chloride 98 - 111 mmol/L 111  111  109   CO2 22 - 32 mmol/L 14  18  17    Calcium 8.9 - 10.3 mg/dL 8.6  8.5  8.1   Total Protein 6.5 - 8.1 g/dL 7.9     Total Bilirubin 0.0 - 1.2 mg/dL 0.6     Alkaline Phos 38 - 126 U/L 69     AST 15 - 41 U/L 22     ALT 0 - 44 U/L 19        Imaging studies:  EXAM: CT ABDOMEN AND PELVIS WITHOUT CONTRAST   TECHNIQUE: Multidetector CT imaging of the abdomen and pelvis was performed following the standard protocol without IV contrast.   RADIATION DOSE REDUCTION: This exam was performed according to the departmental dose-optimization program which includes automated exposure control, adjustment of the mA and/or kV according to patient size and/or use of iterative reconstruction technique.   COMPARISON:  February 27, 2023.   FINDINGS: Lower chest: No acute abnormality.   Hepatobiliary: No focal liver abnormality is seen. No gallstones, gallbladder wall thickening, or biliary dilatation.   Pancreas: Unremarkable. No pancreatic ductal dilatation or surrounding inflammatory changes.   Spleen: Normal in size without focal abnormality.   Adrenals/Urinary Tract: Adrenal glands are unremarkable. Kidneys are normal, without renal calculi, focal lesion, or hydronephrosis. Bladder is unremarkable.   Stomach/Bowel: Stomach is unremarkable. Loop colostomy is noted in left lower quadrant. There is no evidence of bowel obstruction or inflammation.  The appendix appears normal.   Vascular/Lymphatic: Aortic atherosclerosis. No enlarged abdominal or pelvic lymph nodes.   Reproductive: Mildly enlarged prostate gland is  noted. Foley catheter balloon has been inflated within the gland. Repositioning is recommended.   Other: No ascites is noted. 5.4 x 3.3 cm ulceration is seen extending from inferior buttocks region 2 inferior portion of coccyx. Contains debris consistent with abscess. There appears to be some degree of lytic destruction involving the coccyx suggesting osteomyelitis.   Musculoskeletal: As noted above, there appears to be some degree of lytic destruction involving the coccyx suggesting osteomyelitis.   IMPRESSION: Mildly enlarged prostate gland is noted. Foley catheter balloon is inflated within the gland. Repositioning is recommended.   5.4 x 3.3 cm soft tissue ulceration or wound is seen extending from inferior buttocks regions toward inferior portion of coccyx. It contains debris consistent with abscess. There does appear to be some degree of lytic destruction involving the adjacent coccyx suggesting osteomyelitis.   Aortic Atherosclerosis (ICD10-I70.0).     Electronically Signed   By: Lupita Raider M.D.   On: 09/16/2023 15:32    Assessment/Plan:  68 y.o. male with suspected abscess of sacral abscess  Physical exam shows a packing of the sacral wound with gauze and Surgical. No abscess or necrotic tissue. No need of debridement.    Gae Gallop, MD

## 2023-09-16 NOTE — ED Provider Notes (Signed)
 [facility] Provider Note    Event Date/Time   First MD Initiated Contact with Patient 09/16/23 1213     (approximate)   History   Wound Check   HPI  [name] is a 68 y.o. male history of CHF, bipolar disorder, CKD, DVT on Eliquis, neurogenic bladder with indwelling Foley, status post colostomy, history of stage IV sacral decubitus ulcer, presenting with bleeding from his sacral ulcer.  Patient went to his wound care specialist, had a biopsy done of the bone to make sure that there is no osteomyelitis.  He was not instructed to hold his Eliquis, last took his Eliquis yesterday night, did not take it this morning.  There was a lot of bleeding after the procedure and he was sent to the emergency department for further evaluation.  Per wife, patient had quite a bit of blood come out of the wound, he felt lightheaded.  No fevers, no nausea, vomiting, diarrhea.  No chest pain or shortness of breath.  Wife also noted that she saw some cloudy drainage from his urinary catheter today.  States that she changed his bag out prior to him going for his checkup.  Independent history obtained from wife  On independent chart review, he was admitted in January for UTI.  Was discharged on ciprofloxacin.     Physical Exam   Triage Vital Signs: ED Triage Vitals  Encounter Vitals Group     BP 09/16/23 1121 100/75     Systolic BP Percentile --      Diastolic BP Percentile --      Pulse Rate 09/16/23 1121 81     Resp 09/16/23 1121 16     Temp 09/16/23 1121 97.8 F (36.6 C)     Temp Source 09/16/23 1121 Oral     SpO2 09/16/23 1121 100 %     Weight 09/16/23 1119 279 lb (126.6 kg)     Height 09/16/23 1119 5\' 6"  (1.676 m)     Head Circumference --      Peak Flow --      Pain Score 09/16/23 1118 2     Pain Loc --      Pain Education --      Exclude from Growth Chart --     Most recent vital signs: Vitals:   09/16/23 1345 09/16/23 1400  BP: (!) 122/55  124/66  Pulse:  (!) 58  Resp: 15 14  Temp:    SpO2: 96%      General: Awake, no distress.  CV:  Good peripheral perfusion.  Resp:  Normal effort.  Abd:  No distention.  Soft nontender Other:  Sacral ulcer that is deep, there is oozing from his ulcer site, unable to see the base due to clots. no pulsatile bleed.  Does not appear to have any surrounding erythema purulent drainage.  Skin around the ulcer is macerated, no palpable fluctuance or induration.  He has an indwelling Foley with yellow urine, has a colostomy bag with small amount of stool.   ED Results / Procedures / Treatments   Labs (all labs ordered are listed, but only abnormal results are displayed) Labs Reviewed  COMPREHENSIVE METABOLIC PANEL - Abnormal; Notable for the following components:      Result Value   Sodium 134 (*)    Potassium 5.5 (*)    CO2 14 (*)    Glucose, Bld 131 (*)    BUN 49 (*)    Creatinine, Ser 2.85 (*)  Calcium 8.6 (*)    Albumin 3.0 (*)    GFR, Estimated 23 (*)    All other components within normal limits  CBC - Abnormal; Notable for the following components:   RBC 2.88 (*)    Hemoglobin 8.0 (*)    HCT 25.6 (*)    RDW 17.3 (*)    All other components within normal limits  PROTIME-INR - Abnormal; Notable for the following components:   Prothrombin Time 18.2 (*)    INR 1.5 (*)    All other components within normal limits  SEDIMENTATION RATE - Abnormal; Notable for the following components:   Sed Rate >140 (*)    All other components within normal limits  CULTURE, BLOOD (ROUTINE X 2)  CULTURE, BLOOD (ROUTINE X 2)  URINALYSIS, W/ REFLEX TO CULTURE (INFECTION SUSPECTED)  VANCOMYCIN, RANDOM  TROPONIN I (HIGH SENSITIVITY)  TROPONIN I (HIGH SENSITIVITY)     EKG  EKG shows sinus rhythm, rate of 62, normal QRS, normal QTc, no ischemic ST elevation, T wave flattening in 1, aVL, V2, not significant change compared to prior   RADIOLOGY Independent review of CT, there is stranding  around the sacral wound site   PROCEDURES:  Critical Care performed: No  Procedures   MEDICATIONS ORDERED IN ED: Medications  sodium zirconium cyclosilicate (LOKELMA) packet 10 g (has no administration in time range)  vancomycin (VANCOCIN) IVPB 1000 mg/200 mL premix (1,000 mg Intravenous New Bag/Given 09/16/23 1455)    And  vancomycin (VANCOREADY) IVPB 1500 mg/300 mL (has no administration in time range)  piperacillin-tazobactam (ZOSYN) IVPB 3.375 g (3.375 g Intravenous New Bag/Given 09/16/23 1455)  cyanocobalamin (VITAMIN B12) tablet 1,000 mcg (has no administration in time range)  acetaminophen (TYLENOL) tablet 650 mg (has no administration in time range)    Or  acetaminophen (TYLENOL) suppository 650 mg (has no administration in time range)  ondansetron (ZOFRAN) tablet 4 mg (has no administration in time range)    Or  ondansetron (ZOFRAN) injection 4 mg (has no administration in time range)  vancomycin variable dose per unstable renal function (pharmacist dosing) (has no administration in time range)  sodium bicarbonate tablet 650 mg (has no administration in time range)  ceFEPIme (MAXIPIME) 2 g in sodium chloride 0.9 % 100 mL IVPB (has no administration in time range)  0.9 %  sodium chloride infusion (has no administration in time range)  tranexamic acid (CYKLOKAPRON) 1000 MG/10ML topical solution 500 mg (500 mg Topical Given by Other 09/16/23 1335)     IMPRESSION / MDM / ASSESSMENT AND PLAN / ED COURSE  I reviewed the triage vital signs and the nursing notes.                              Differential diagnosis includes, but is not limited to, medication side effect, no evidence of arterial bleed, surgical site bleeding, anemia, also consider osteomyelitis given the deep sacral wound that extends to bone.  Will get labs, CT of the area, will try to pack the area with TXA soaked sponges and Surgicel.  Patient's presentation is most consistent with acute presentation with  potential threat to life or bodily function.  Remove the sponges from the sacral wound site, placed for Surgicel's to line the inside the sacral ulcer and packed to gauze sponges soaked with TXA into the site.  Bleeding appears improved.  CT scan on my interpretation showed some stranding around the sacral ulcer site, will  start him on antibiotics to cover for wound infection and possible osteomyelitis.  He will need to be admitted for further management.  Consulted hospitalist who will evaluate the patient.  He is admitted.  Clinical Course as of 09/16/23 1526  Tue Sep 16, 2023  1436 Independent review of labs, troponin is negative, he has an AKI, potassium is 5.5, LFTs are not elevated, no leukocytosis.  His H&H is stable.  His potassium is slightly higher than prior, he has no new EKG changes, will give him some Lokelma here. [TT]    Clinical Course User Index [TT] Jodie Echevaria, Franchot Erichsen, MD     FINAL CLINICAL IMPRESSION(S) / ED DIAGNOSES   Final diagnoses:  AKI (acute kidney injury) (HCC)  Wound infection  Bleeding from wound     Rx / DC Orders   ED Discharge Orders     None        Note:  This document was prepared using Dragon voice recognition software and may include unintentional dictation errors.    Claybon Jabs, MD 09/16/23 [phone]

## 2023-09-16 NOTE — ED Notes (Signed)
 Pt returned from CT

## 2023-09-16 NOTE — Hospital Course (Signed)
 [name] is a 68 year old male with history of morbid obesity, bipolar, CKD 3B, history of DVT on Eliquis, neurogenic bladder requiring catheter,, who presents emergency department for chief concerns of sacral/buttock wound bleeding.  Vitals in the ED showed temperature 97.8, respiration rate of 16, heart rate 81, blood pressure 100/75, SpO2 100% on room air.  Serum sodium is 134, potassium 5.5, chloride 111, bicarb 14, BUN 49, serum creatinine 2.85, EGFR 23, nonfasting blood glucose 131, WBC 7.7, hemoglobin 8.0, platelets of 215.  Sed rate is greater than 140.  Initial high high-sensitivity troponin was 8.  ED treatment: Lokelma 10 mg p.o. one-time dose, TXA 500 mg TP, vancomycin, Zosyn.

## 2023-09-16 NOTE — Assessment & Plan Note (Signed)
 Home amlodipine-olmesartan 5-20 mg tablet daily not resumed on admission in setting of acute kidney injury Hydralazine 5 mg IV every 6 hours as needed for SBP greater than 165, 5 days ordered

## 2023-09-16 NOTE — ED Triage Notes (Signed)
 Pt states that he has had a wound to his buttocks area that he went to the wound clinic for and it is bleeding and was told to come to the ER, states that he had an xray while he was there

## 2023-09-16 NOTE — ED Notes (Signed)
 Urine bag emptied, .

## 2023-09-16 NOTE — Assessment & Plan Note (Signed)
 Home oxcarbamazepine 300 mg p.o. twice daily resumed

## 2023-09-16 NOTE — Assessment & Plan Note (Addendum)
 Acute on chronic with bleed Status post TXA Blood cultures x 2 are ordered on admission in setting of history of MSSA bacteremia General Surgery has been consulted via secure chat and epic order, to Dr. Maia Plan

## 2023-09-16 NOTE — Assessment & Plan Note (Signed)
 Suspect secondary to acute kidney injury and CKD, treatment per above Sodium bicarbonate tablet 650 mg p.o. twice daily, 2 days ordered Recheck BMP in a.m.

## 2023-09-17 DIAGNOSIS — E875 Hyperkalemia: Secondary | ICD-10-CM | POA: Diagnosis not present

## 2023-09-17 LAB — BASIC METABOLIC PANEL
Anion gap: 6 (ref 5–15)
BUN: 45 mg/dL — ABNORMAL HIGH (ref 8–23)
CO2: 17 mmol/L — ABNORMAL LOW (ref 22–32)
Calcium: 8.2 mg/dL — ABNORMAL LOW (ref 8.9–10.3)
Chloride: 109 mmol/L (ref 98–111)
Creatinine, Ser: 2.58 mg/dL — ABNORMAL HIGH (ref 0.61–1.24)
GFR, Estimated: 26 mL/min — ABNORMAL LOW (ref >60)
Glucose, Bld: 149 mg/dL — ABNORMAL HIGH (ref 70–99)
Potassium: 5.1 mmol/L (ref 3.5–5.1)
Sodium: 132 mmol/L — ABNORMAL LOW (ref 135–145)

## 2023-09-17 LAB — GLUCOSE, CAPILLARY
Glucose-Capillary: 115 mg/dL — ABNORMAL HIGH (ref 70–99)
Glucose-Capillary: 124 mg/dL — ABNORMAL HIGH (ref 70–99)

## 2023-09-17 LAB — CBC
HCT: 24.1 % — ABNORMAL LOW (ref 39.0–52.0)
Hemoglobin: 7.5 g/dL — ABNORMAL LOW (ref 13.0–17.0)
MCH: 27.1 pg (ref 26.0–34.0)
MCHC: 31.1 g/dL (ref 30.0–36.0)
MCV: 87 fL (ref 80.0–100.0)
Platelets: 210 10*3/uL (ref 150–400)
RBC: 2.77 MIL/uL — ABNORMAL LOW (ref 4.22–5.81)
RDW: 17.1 % — ABNORMAL HIGH (ref 11.5–15.5)
WBC: 5.4 10*3/uL (ref 4.0–10.5)
nRBC: 0 % (ref 0.0–0.2)

## 2023-09-17 MED ORDER — CHLORHEXIDINE GLUCONATE CLOTH 2 % EX PADS
6.0000 | MEDICATED_PAD | Freq: Every day | CUTANEOUS | Status: DC
  Administered 2023-09-17 – 2023-09-21 (×5): 6 via TOPICAL

## 2023-09-17 MED ORDER — LORAZEPAM 0.5 MG PO TABS: 0.5000 mg | ORAL_TABLET | Freq: Four times a day (QID) | ORAL | Status: DC | PRN

## 2023-09-17 NOTE — Progress Notes (Addendum)
 PROGRESS NOTE    [name]  ZOX:[id number] DOB: [birth year] DOA: 09/16/2023 PCP: Dione Booze, MD   Assessment & Plan:   Principal Problem:   Hyperkalemia Active Problems:   Chronic anticoagulation - on Eliquis   Sacral osteomyelitis (HCC)   Sacral decubitus ulcer   Type II diabetes mellitus with renal manifestations (HCC)   History of DVT (deep vein thrombosis)   Stage 3b chronic kidney disease (CKD) (HCC) - baseline SCr 1.8-1.9   TBI (traumatic brain injury) (HCC)   AKI (acute kidney injury) (HCC)   Essential hypertension   Metabolic acidosis   Hyperlipidemia   Vasculogenic erectile dysfunction   Urinary retention   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 Likely secondary to AKI    Sacral osteomyelitis: w/ acute on chronic bleed. S/p TXA. Sedentary lifestyle. Blood cxs NGTD. No debridement needed as per gen surg. Continue on IV cefepime, vanco  Metabolic acidosis: trending up. Continue on bicarb    HTN: holding amlodipine, olmesartan secondary to AKI. BP is currently WNL    CKDIIIb: Cr is trending down from day prior. Avoid nephrotoxic meds    Hx of DVT: holding home dose of eliquis secondary to sacral wound bleeding requiring TXA.    DM2: well controlled, HbA1c 5.9 in 07/2023. Continue on SSI w/ accuchecks    Colostomy: continue w/ supportive care    Major neurocognitive disorder: secondary to 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 lobar degeneration. Continue on home dose of seroquel  Morbid obesity: BMI 45.0. Complicates overall care & prognosis   Bipolar disorder: continue on home dose of oxcarbazepine    GAD: severity unknown. Ativan prn   Normocytic anemia: no need for a  transfusion currently    DVT prophylaxis: SCDs Code Status: full Family Communication:  Disposition Plan: likely d/c back home   Level of care: Telemetry Medical  Status is: Inpatient Remains inpatient appropriate because: severity of illness, requiring IV abxs      Consultants:    Procedures:   Antimicrobials: cefepime, vanco    Subjective: Pt c/o fatigue   Objective: Vitals:   09/16/23 1730 09/16/23 2153 09/17/23 0419 09/17/23 0752  BP: 112/69 131/78 115/71 118/65  Pulse: 65 65 60 62  Resp: 16 17  16   Temp: 97.7 F (36.5 C)  97.9 F (36.6 C) 97.7 F (36.5 C)  TempSrc:   Oral   SpO2: 92% 95% 100% 100%  Weight:      Height:        Intake/Output Summary (Last 24 hours) at 09/17/2023 0842 Last data filed at 09/17/2023 0512 Gross per 24 hour  Intake 1750 ml  Output 350 ml  Net 1400 ml   Filed Weights   09/16/23 1119  Weight: 126.6 kg    Examination:  General exam: Appears calm and comfortable  Respiratory system: Clear to auscultation. Respiratory effort normal. Cardiovascular system: S1 & S2+. No rubs, gallops or clicks.  Gastrointestinal system: Abdomen is obese, soft and nontender. Ostomy present. Normal bowel sounds heard. Central nervous system: Alert and awake. Moves all extremities  Psychiatry: Judgement and insight appears poor. Flat mood and affect     Data Reviewed: I have personally reviewed following labs and imaging studies  CBC: Recent Labs  Lab 09/16/23 1241 09/17/23  0159  WBC 7.7 5.4  HGB 8.0* 7.5*  HCT 25.6* 24.1*  MCV 88.9 87.0  PLT 215 210   Basic Metabolic Panel: Recent Labs  Lab 09/16/23 1123 09/17/23 0159  NA 134* 132*  K 5.5* 5.1  CL 111 109  CO2 14* 17*  GLUCOSE 131* 149*  BUN 49* 45*  CREATININE 2.85* 2.58*  CALCIUM 8.6* 8.2*   GFR: Estimated Creatinine Clearance: 34.5 mL/min (A) (by C-G formula based on SCr of 2.58 mg/dL (H)). Liver Function Tests: Recent Labs  Lab 09/16/23 1123  AST 22  ALT 19   ALKPHOS 69  BILITOT 0.6  PROT 7.9  ALBUMIN 3.0*   No results for input(s): "LIPASE", "AMYLASE" in the last 168 hours. No results for input(s): "AMMONIA" in the last 168 hours. Coagulation Profile: Recent Labs  Lab 09/16/23 1241  INR 1.5*   Cardiac Enzymes: No results for input(s): "CKTOTAL", "CKMB", "CKMBINDEX", "TROPONINI" in the last 168 hours. BNP (last 3 results) No results for input(s): "PROBNP" in the last 8760 hours. HbA1C: No results for input(s): "HGBA1C" in the last 72 hours. CBG: Recent Labs  Lab 09/16/23 2158 09/17/23 0816  GLUCAP 165* 115*   Lipid Profile: No results for input(s): "CHOL", "HDL", "LDLCALC", "TRIG", "CHOLHDL", "LDLDIRECT" in the last 72 hours. Thyroid Function Tests: No results for input(s): "TSH", "T4TOTAL", "FREET4", "T3FREE", "THYROIDAB" in the last 72 hours. Anemia Panel: No results for input(s): "VITAMINB12", "FOLATE", "FERRITIN", "TIBC", "IRON", "RETICCTPCT" in the last 72 hours. Sepsis Labs: No results for input(s): "PROCALCITON", "LATICACIDVEN" in the last 168 hours.  No results found for this or any previous visit (from the past 240 hours).       Radiology Studies: CT ABDOMEN PELVIS WO CONTRAST Result Date: 09/16/2023 CLINICAL DATA:  Sacral wound. EXAM: CT ABDOMEN AND PELVIS WITHOUT CONTRAST TECHNIQUE: Multidetector CT imaging of the abdomen and pelvis was performed following the standard protocol without IV contrast. RADIATION DOSE REDUCTION: This exam was performed according to the departmental dose-optimization program which includes automated exposure control, adjustment of the mA and/or kV according to patient size and/or use of iterative reconstruction technique. COMPARISON:  February 27, 2023. FINDINGS: Lower chest: No acute abnormality. Hepatobiliary: No focal liver abnormality is seen. No gallstones, gallbladder wall thickening, or biliary dilatation. Pancreas: Unremarkable. No pancreatic ductal dilatation or surrounding  inflammatory changes. Spleen: Normal in size without focal abnormality. Adrenals/Urinary Tract: Adrenal glands are unremarkable. Kidneys are normal, without renal calculi, focal lesion, or hydronephrosis. Bladder is unremarkable. Stomach/Bowel: Stomach is unremarkable. Loop colostomy is noted in left lower quadrant. There is no evidence of bowel obstruction or inflammation. The appendix appears normal. Vascular/Lymphatic: Aortic atherosclerosis. No enlarged abdominal or pelvic lymph nodes. Reproductive: Mildly enlarged prostate gland is noted. Foley catheter balloon has been inflated within the gland. Repositioning is recommended. Other: No ascites is noted. 5.4 x 3.3 cm ulceration is seen extending from inferior buttocks region 2 inferior portion of coccyx. Contains debris consistent with abscess. There appears to be some degree of lytic destruction involving the coccyx suggesting osteomyelitis. Musculoskeletal: As noted above, there appears to be some degree of lytic destruction involving the coccyx suggesting osteomyelitis. IMPRESSION: Mildly enlarged prostate gland is noted. Foley catheter balloon is inflated within the gland. Repositioning is recommended. 5.4 x 3.3 cm soft tissue ulceration or wound is seen extending from inferior buttocks regions toward inferior portion of coccyx. It contains debris consistent with abscess. There does appear to be some degree of lytic destruction involving the adjacent coccyx suggesting osteomyelitis.  Aortic Atherosclerosis (ICD10-I70.0). Electronically Signed   By: Lupita Raider M.D.   On: 09/16/2023 15:32   DG Sacrum/Coccyx Result Date: 09/16/2023 CLINICAL DATA:  Sacral wound.  Bone infection. EXAM: SACRUM AND COCCYX - 2+ VIEW COMPARISON:  Pelvic radiograph dated 12/16/2021. FINDINGS: No acute fracture or dislocation. The bones are osteopenic. Severe arthritic changes of the hips. The soft tissues are grossly unremarkable. Atherosclerotic calcification of the aorta.  IMPRESSION: 1. No acute fracture or dislocation. 2. Severe arthritic changes of the hips. Electronically Signed   By: Elgie Collard M.D.   On: 09/16/2023 12:03        Scheduled Meds:  cyanocobalamin  1,000 mcg Oral Daily   insulin aspart  0-5 Units Subcutaneous QHS   insulin aspart  0-9 Units Subcutaneous TID WC   memantine  10 mg Oral BID   Oxcarbazepine  300 mg Oral BID   rosuvastatin  20 mg Oral QHS   sodium bicarbonate  650 mg Oral BID   vancomycin variable dose per unstable renal function (pharmacist dosing)   Does not apply See admin instructions   Continuous Infusions:  sodium chloride 125 mL/hr at 09/17/23 0512   ceFEPime (MAXIPIME) IV Stopped (09/16/23 2310)     LOS: 1 day       Charise Killian, MD [facility] Pager [phone]  If 7PM-7AM, please contact night-coverage [email or web address] 09/17/2023, 8:42 AM

## 2023-09-17 NOTE — Progress Notes (Signed)
 Pharmacy Antibiotic Note  [name] is a 68 y.o. male admitted on 09/16/2023 with cellulitis and wound infection .  PMH includes morbid obesity, sacral osteomyelitis with MSSA Bacteremia (2023), and Ecoli bacteremia. Patient has a wound to his buttocks area that was bleeding on evaluation at wound clinic and was advised to come to ER. Pharmacy has been consulted for vancomycin and cefepime dosing.  Patient in mild AKI upon admission; renal function improved today, nearly back at baseline.  Plan: Vancomycin 2500 mg loading dose given 3/18 Estimated Cmax from load 39 mg/L and half-life of vancomycin estimated to be ~30 hours Anticipating being able to move to AUC dosing on 3/20 with next dose likely to be timed ~48 hours from loading dose. Will obtain vancomycin random with tomorrow AM labs to determine if dose needs to be given earlier on 3/20 Continue cefepime 2 g IV Q12H Continue to monitor renal function and follow culture results   Height: 5\' 6"  (167.6 cm) Weight: 126.6 kg (279 lb) IBW/kg (Calculated) : 63.8  Temp (24hrs), Avg:97.9 F (36.6 C), Min:97.7 F (36.5 C), Max:98.2 F (36.8 C)  Recent Labs  Lab 09/16/23 1123 09/16/23 1241 09/16/23 1524 09/17/23 0159  WBC  --  7.7  --  5.4  CREATININE 2.85*  --   --  2.58*  VANCORANDOM  --   --  15  --     Estimated Creatinine Clearance: 34.5 mL/min (A) (by C-G formula based on SCr of 2.58 mg/dL (H)).    No Known Allergies  Antimicrobials this admission: 3/18 Vanc >>  3/18 Cefepime>> 3/18 Zosyn x 1  Microbiology results: None  Thank you for allowing pharmacy to be a part of this patient's care.  Will M. Dareen Piano, PharmD Clinical Pharmacist 09/17/2023 12:45 PM

## 2023-09-17 NOTE — Plan of Care (Signed)
  Problem: Education: Goal: Knowledge of General Education information will improve Description: Including pain rating scale, medication(s)/side effects and non-pharmacologic comfort measures Outcome: Progressing   Problem: Health Behavior/Discharge Planning: Goal: Ability to manage health-related needs will improve Outcome: Progressing   Problem: Clinical Measurements: Goal: Ability to maintain clinical measurements within normal limits will improve Outcome: Progressing Goal: Will remain free from infection Outcome: Progressing Goal: Diagnostic test results will improve Outcome: Progressing Goal: Respiratory complications will improve Outcome: Progressing Goal: Cardiovascular complication will be avoided Outcome: Progressing   Problem: Activity: Goal: Risk for activity intolerance will decrease Outcome: Progressing   Problem: Nutrition: Goal: Adequate nutrition will be maintained Outcome: Progressing   Problem: Coping: Goal: Level of anxiety will decrease Outcome: Progressing   Problem: Elimination: Goal: Will not experience complications related to bowel motility Outcome: Progressing Goal: Will not experience complications related to urinary retention Outcome: Progressing   Problem: Pain Managment: Goal: General experience of comfort will improve and/or be controlled Outcome: Progressing   Problem: Safety: Goal: Ability to remain free from injury will improve Outcome: Progressing   Problem: Skin Integrity: Goal: Risk for impaired skin integrity will decrease Outcome: Progressing   Problem: Coping: Goal: Ability to adjust to condition or change in health will improve Outcome: Progressing   Problem: Education: Goal: Ability to describe self-care measures that may prevent or decrease complications (Diabetes Survival Skills Education) will improve Outcome: Progressing Goal: Individualized Educational Video(s) Outcome: Progressing   Problem: Fluid  Volume: Goal: Ability to maintain a balanced intake and output will improve Outcome: Progressing   Problem: Health Behavior/Discharge Planning: Goal: Ability to identify and utilize available resources and services will improve Outcome: Progressing Goal: Ability to manage health-related needs will improve Outcome: Progressing   Problem: Nutritional: Goal: Maintenance of adequate nutrition will improve Outcome: Progressing Goal: Progress toward achieving an optimal weight will improve Outcome: Progressing   Problem: Skin Integrity: Goal: Risk for impaired skin integrity will decrease Outcome: Progressing   Problem: Metabolic: Goal: Ability to maintain appropriate glucose levels will improve Outcome: Progressing   Problem: Tissue Perfusion: Goal: Adequacy of tissue perfusion will improve Outcome: Progressing

## 2023-09-17 NOTE — Consult Note (Signed)
 WOC Nurse Consult Note: this patient is well known to [facility] team from previous admissions with a Stage 4 sacral ulcer, Stage 3 R heel ulcer and colostomy; surgery has evaluated sacral wound due to bleeding and packed with Surgicel and gauze; patient followed at [facility] (last seen 09/16/2023)  where Hydrofera Blue is being utilized for both wounds; Hydrofera Blue not on formulary at [employer] is substitute  Reason for Consult: sacrum and heel wounds  Wound type: Stage 3 R heel, Stage 4 Sacrum  Pressure Injury POA: yes  Measurement: per [facility] note R calcaneus 2 cm x 0.5 cm x 0.3 cm; sacrum 4.5 cm x 3 cm x 5.2 cm  Wound bed: both wounds appear both largely red moist in [facility] photos  Drainage (amount, consistency, odor) per nursing flowsheet  Periwound: sacrum with scar tissue  Dressing procedure/placement/frequency:  Cleanse R heel with Vashe wound cleanser [place] [phone]), apply silver hydrofiber (Aquacel [place] #[id number]) cut to fit wound bed daily.  Cover with gauze and Kerlix or silicone foam. Place B heels in Prevalon boots to offload pressure  Cleanse sacral wound with Vashe wound cleanser([employer] #[id number]), apply Silver hydrofiber [place] [phone]) to wound bed making sure to cover all areas of undermining and fill entire wound.  Cover with dry gauze and silicone foam.    Patient should be placed on a low air loss mattress for pressure redistribution and moisture management.   WOC Nurse ostomy consult note; patient with longstanding colostomy, Stoma type/location: LMQ colostomy  Stomal assessment/size: approximately 1 3/4" on last assessment by WOC nurse  Peristomal assessment:  not observed  Treatment options for stomal/peristomal skin: 2" barrier ring  Output  Ostomy pouching: historically wears a 1 piece flat [facility] [place] [phone]  Education provided: none, patient knowledgeable regarding care of his ostomy  Enrolled patient in [employer] DC program: no established ostomy   Will place  orders for 1 piece flat flexible [facility] [place] [phone] and 2" barrier ring [facility] [place] [phone].   WOC team will not follow Re-consult if further needs arise.   Thank you,    Priscella Mann MSN, RN-BC, [employer] [phone]

## 2023-09-18 DIAGNOSIS — L89154 Pressure ulcer of sacral region, stage 4: Secondary | ICD-10-CM

## 2023-09-18 DIAGNOSIS — N390 Urinary tract infection, site not specified: Secondary | ICD-10-CM

## 2023-09-18 DIAGNOSIS — M4628 Osteomyelitis of vertebra, sacral and sacrococcygeal region: Secondary | ICD-10-CM | POA: Diagnosis not present

## 2023-09-18 LAB — CBC
HCT: 23.4 % — ABNORMAL LOW (ref 39.0–52.0)
Hemoglobin: 7.1 g/dL — ABNORMAL LOW (ref 13.0–17.0)
MCH: 26.8 pg (ref 26.0–34.0)
MCHC: 30.3 g/dL (ref 30.0–36.0)
MCV: 88.3 fL (ref 80.0–100.0)
Platelets: 200 10*3/uL (ref 150–400)
RBC: 2.65 MIL/uL — ABNORMAL LOW (ref 4.22–5.81)
RDW: 16.9 % — ABNORMAL HIGH (ref 11.5–15.5)
WBC: 4.3 10*3/uL (ref 4.0–10.5)
nRBC: 0 % (ref 0.0–0.2)

## 2023-09-18 LAB — BASIC METABOLIC PANEL
Anion gap: 6 (ref 5–15)
BUN: 42 mg/dL — ABNORMAL HIGH (ref 8–23)
CO2: 19 mmol/L — ABNORMAL LOW (ref 22–32)
Calcium: 8.3 mg/dL — ABNORMAL LOW (ref 8.9–10.3)
Chloride: 113 mmol/L — ABNORMAL HIGH (ref 98–111)
Creatinine, Ser: 2.33 mg/dL — ABNORMAL HIGH (ref 0.61–1.24)
GFR, Estimated: 30 mL/min — ABNORMAL LOW (ref >60)
Glucose, Bld: 115 mg/dL — ABNORMAL HIGH (ref 70–99)
Potassium: 5 mmol/L (ref 3.5–5.1)
Sodium: 138 mmol/L (ref 135–145)

## 2023-09-18 LAB — URINE CULTURE: Culture: >=100000 — AB

## 2023-09-18 LAB — VANCOMYCIN, RANDOM: Vancomycin Rm: 10 ug/mL

## 2023-09-18 LAB — GLUCOSE, CAPILLARY
Glucose-Capillary: 115 mg/dL — ABNORMAL HIGH (ref 70–99)
Glucose-Capillary: 108 mg/dL — ABNORMAL HIGH (ref 70–99)
Glucose-Capillary: 98 mg/dL (ref 70–99)
Glucose-Capillary: 138 mg/dL — ABNORMAL HIGH (ref 70–99)

## 2023-09-18 LAB — MRSA NEXT GEN BY PCR, NASAL: MRSA by PCR Next Gen: DETECTED — AB

## 2023-09-18 MED ORDER — VANCOMYCIN HCL 750 MG/150ML IV SOLN
750.0000 mg | INTRAVENOUS | Status: DC
  Administered 2023-09-18: 750 mg via INTRAVENOUS
  Filled 2023-09-18: qty 150

## 2023-09-18 MED ORDER — LEVOFLOXACIN 750 MG PO TABS
750.0000 mg | ORAL_TABLET | ORAL | Status: DC
  Administered 2023-09-18 – 2023-09-20 (×2): 750 mg via ORAL
  Filled 2023-09-18 (×2): qty 1

## 2023-09-18 NOTE — Plan of Care (Signed)

## 2023-09-18 NOTE — Progress Notes (Addendum)
 PROGRESS NOTE    [name]  GUY:[id number] DOB: October 09, 1955 DOA: 09/16/2023 PCP: Dione Booze, MD   Assessment & Plan:   Principal Problem:   Hyperkalemia Active Problems:   Chronic anticoagulation - on Eliquis   Sacral osteomyelitis (HCC)   Sacral decubitus ulcer   Type II diabetes mellitus with renal manifestations (HCC)   History of DVT (deep vein thrombosis)   Stage 3b chronic kidney disease (CKD) (HCC) - baseline SCr 1.8-1.9   TBI (traumatic brain injury) (HCC)   AKI (acute kidney injury) (HCC)   Essential hypertension   Metabolic acidosis   Hyperlipidemia   Vasculogenic erectile dysfunction   Urinary retention   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 S/p TXA. Sedentary lifestyle. Blood cxs NGTD. No debridement needed as per gen surg. Continue on IV cefepime, vanco. ID consulted   UTI: urine cx growing proteus. Continue on IV cefepime   Metabolic acidosis: improving. Continue on bicarb    HTN: holding amlodipine, olmesartan secondary to AKI. BP is currently WNL   CKDIIIb: Cr is trending down daily. Avoid nephrotoxic meds    Hx of DVT: holding home dose of eliquis secondary to sacral wound bleeding requiring TXA.    DM2: well controlled, HbA1c 5.9 in 07/2023. Continue w/ SSI w/ accuchecks   Colostomy: continue w/ supportive care    Major neurocognitive disorder: secondary to 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 lobar degeneration. Continue on home dose of seroquel   Chronic diastolic CHF: appears euvolemic. Monitor I/Os. Holding olmesartan   Morbid obesity: BMI 45.0. Complicates overall care & prognosis   Bipolar disorder:  continue on home dose of oxcarbazepine    GAD: severity unknown. Ativan prn   Normocytic anemia: will transfuse if Hb < 7.0    DVT prophylaxis: SCDs Code Status: full Family Communication:  Disposition Plan: likely d/c back home   Level of care: Telemetry Medical  Status is: Inpatient Remains inpatient appropriate because: severity of illness, requiring IV abxs      Consultants:  ID  Procedures:   Antimicrobials: cefepime, vanco    Subjective: Pt c/o malaise   Objective: Vitals:   09/17/23 0419 09/17/23 0752 09/17/23 2034 09/18/23 0542  BP: 115/71 118/65 131/67 129/75  Pulse: 60 62 100 69  Resp:  16 16 17   Temp: 97.9 F (36.6 C) 97.7 F (36.5 C) 98.6 F (37 C) 97.9 F (36.6 C)  TempSrc: Oral     SpO2: 100% 100% 100% 99%  Weight:      Height:        Intake/Output Summary (Last 24 hours) at 09/18/2023 0843 Last data filed at 09/17/2023 1904 Gross per 24 hour  Intake 480 ml  Output --  Net 480 ml   Filed Weights   09/16/23 1119  Weight: 126.6 kg    Examination:  General exam: Appears comfortable  Respiratory system: clear breath sounds b/l  Cardiovascular system: S1/S2+. No rubs or clicks Gastrointestinal system: Abd is soft, NT, obes & hypoactive bowel sounds Central nervous system: alert & awake. Moves all extremities Psychiatry: Judgement and insight appears poor. Flat mood and affect    Data Reviewed: I have personally reviewed following labs and imaging studies  CBC: Recent  Labs  Lab 09/16/23 1241 09/17/23 0159 09/18/23 0510  WBC 7.7 5.4 4.3  HGB 8.0* 7.5* 7.1*  HCT 25.6* 24.1* 23.4*  MCV 88.9 87.0 88.3  PLT 215 210 200   Basic Metabolic Panel: Recent Labs  Lab 09/16/23 1123 09/17/23 0159 09/18/23 0510  NA 134* 132* 138  K 5.5* 5.1 5.0  CL 111 109 113*  CO2 14* 17* 19*  GLUCOSE 131* 149* 115*  BUN 49* 45* 42*  CREATININE 2.85* 2.58* 2.33*  CALCIUM 8.6* 8.2* 8.3*   GFR: Estimated Creatinine Clearance: 38.2 mL/min (A)  (by C-G formula based on SCr of 2.33 mg/dL (H)). Liver Function Tests: Recent Labs  Lab 09/16/23 1123  AST 22  ALT 19  ALKPHOS 69  BILITOT 0.6  PROT 7.9  ALBUMIN 3.0*   No results for input(s): "LIPASE", "AMYLASE" in the last 168 hours. No results for input(s): "AMMONIA" in the last 168 hours. Coagulation Profile: Recent Labs  Lab 09/16/23 1241  INR 1.5*   Cardiac Enzymes: No results for input(s): "CKTOTAL", "CKMB", "CKMBINDEX", "TROPONINI" in the last 168 hours. BNP (last 3 results) No results for input(s): "PROBNP" in the last 8760 hours. HbA1C: No results for input(s): "HGBA1C" in the last 72 hours. CBG: Recent Labs  Lab 09/17/23 0816 09/17/23 1117 09/17/23 1701 09/17/23 2128 09/18/23 0805  GLUCAP 115* 124* 115* 108* 98   Lipid Profile: No results for input(s): "CHOL", "HDL", "LDLCALC", "TRIG", "CHOLHDL", "LDLDIRECT" in the last 72 hours. Thyroid Function Tests: No results for input(s): "TSH", "T4TOTAL", "FREET4", "T3FREE", "THYROIDAB" in the last 72 hours. Anemia Panel: No results for input(s): "VITAMINB12", "FOLATE", "FERRITIN", "TIBC", "IRON", "RETICCTPCT" in the last 72 hours. Sepsis Labs: No results for input(s): "PROCALCITON", "LATICACIDVEN" in the last 168 hours.  Recent Results (from the past 240 hours)  Culture, blood (Routine X 2) w Reflex to ID Panel     Status: None (Preliminary result)   Collection Time: 09/16/23  3:24 PM   Specimen: BLOOD  Result Value Ref Range Status   Specimen Description BLOOD BLOOD RIGHT HAND  Final   Special Requests   Final    BOTTLES DRAWN AEROBIC AND ANAEROBIC Blood Culture results may not be optimal due to an inadequate volume of blood received in culture bottles   Culture   Final    NO GROWTH 2 DAYS Performed at [facility], [address] [place], [place] [address]    Report Status PENDING  Incomplete  Culture, blood (Routine X 2) w Reflex to ID Panel     Status: None (Preliminary result)   Collection  Time: 09/16/23  3:24 PM   Specimen: BLOOD  Result Value Ref Range Status   Specimen Description BLOOD LEFT ANTECUBITAL  Final   Special Requests   Final    BOTTLES DRAWN AEROBIC AND ANAEROBIC Blood Culture results may not be optimal due to an inadequate volume of blood received in culture bottles   Culture   Final    NO GROWTH 2 DAYS Performed at [facility], [address] [place], [place] [address]    Report Status PENDING  Incomplete  Urine Culture     Status: Abnormal (Preliminary result)   Collection Time: 09/16/23  3:24 PM   Specimen: Urine, Random  Result Value Ref Range Status   Specimen Description   Final    URINE, RANDOM Performed at [facility], [address] [place], [place] [address]    Special Requests   Final    NONE  Reflexed from [id number] Performed at [facility], [address] [place], [place] [address]    Culture (A)  Final    >=100,000 COLONIES/mL GRAM NEGATIVE RODS IDENTIFICATION AND SUSCEPTIBILITIES TO FOLLOW Performed at [facility], [address], [place], [place] [address]    Report Status PENDING  Incomplete         Radiology Studies: CT ABDOMEN PELVIS WO CONTRAST Result Date: 09/16/2023 CLINICAL DATA:  Sacral wound. EXAM: CT ABDOMEN AND PELVIS WITHOUT CONTRAST TECHNIQUE: Multidetector CT imaging of the abdomen and pelvis was performed following the standard protocol without IV contrast. RADIATION DOSE REDUCTION: This exam was performed according to the departmental dose-optimization program which includes automated exposure control, adjustment of the mA and/or kV according to patient size and/or use of iterative reconstruction technique. COMPARISON:  February 27, 2023. FINDINGS: Lower chest: No acute abnormality. Hepatobiliary: No focal liver abnormality is seen. No gallstones, gallbladder wall thickening, or biliary dilatation. Pancreas: Unremarkable. No pancreatic ductal dilatation or surrounding inflammatory  changes. Spleen: Normal in size without focal abnormality. Adrenals/Urinary Tract: Adrenal glands are unremarkable. Kidneys are normal, without renal calculi, focal lesion, or hydronephrosis. Bladder is unremarkable. Stomach/Bowel: Stomach is unremarkable. Loop colostomy is noted in left lower quadrant. There is no evidence of bowel obstruction or inflammation. The appendix appears normal. Vascular/Lymphatic: Aortic atherosclerosis. No enlarged abdominal or pelvic lymph nodes. Reproductive: Mildly enlarged prostate gland is noted. Foley catheter balloon has been inflated within the gland. Repositioning is recommended. Other: No ascites is noted. 5.4 x 3.3 cm ulceration is seen extending from inferior buttocks region 2 inferior portion of coccyx. Contains debris consistent with abscess. There appears to be some degree of lytic destruction involving the coccyx suggesting osteomyelitis. Musculoskeletal: As noted above, there appears to be some degree of lytic destruction involving the coccyx suggesting osteomyelitis. IMPRESSION: Mildly enlarged prostate gland is noted. Foley catheter balloon is inflated within the gland. Repositioning is recommended. 5.4 x 3.3 cm soft tissue ulceration or wound is seen extending from inferior buttocks regions toward inferior portion of coccyx. It contains debris consistent with abscess. There does appear to be some degree of lytic destruction involving the adjacent coccyx suggesting osteomyelitis. Aortic Atherosclerosis (ICD10-I70.0). Electronically Signed   By: Lupita Raider M.D.   On: 09/16/2023 15:32   DG Sacrum/Coccyx Result Date: 09/16/2023 CLINICAL DATA:  Sacral wound.  Bone infection. EXAM: SACRUM AND COCCYX - 2+ VIEW COMPARISON:  Pelvic radiograph dated 12/16/2021. FINDINGS: No acute fracture or dislocation. The bones are osteopenic. Severe arthritic changes of the hips. The soft tissues are grossly unremarkable. Atherosclerotic calcification of the aorta. IMPRESSION: 1.  No acute fracture or dislocation. 2. Severe arthritic changes of the hips. Electronically Signed   By: Elgie Collard M.D.   On: 09/16/2023 12:03        Scheduled Meds:  Chlorhexidine Gluconate Cloth  6 each Topical Daily   cyanocobalamin  1,000 mcg Oral Daily   insulin aspart  0-5 Units Subcutaneous QHS   insulin aspart  0-9 Units Subcutaneous TID WC   memantine  10 mg Oral BID   Oxcarbazepine  300 mg Oral BID   rosuvastatin  20 mg Oral QHS   vancomycin variable dose per unstable renal function (pharmacist dosing)   Does not apply See admin instructions   Continuous Infusions:  ceFEPime (MAXIPIME) IV 2 g (09/17/23 2203)     LOS: 2 days       Charise Killian, MD [facility] Pager [phone]  If 7PM-7AM, please contact night-coverage [email or web address] 09/18/2023, 8:43 AM

## 2023-09-18 NOTE — Consult Note (Signed)
 NAME: [name]  DOB: 1955/12/30  MRN: [id number]  Date/Time: 09/18/2023 8:53 PM  REQUESTING PROVIDER: Dr.Williams Subjective:  REASON FOR CONSULT: sacral ulcer and osteo ? [name] is a 68 y.o. with a history of   TBI  paraperesis, neurogenic bladder , indwelling foley, CKD, diverting colostomy, sacral and heel decubitus, multiple bacteremia in the past treated for MSSA bacteremia, Ecoli bacteremia, and in sept 2024 for klebsiella bacteremia due to foley presents with bleeding sacral wound Pt is followed at the wound clinic and underwent debridement and bone biopsy on 3/18. As he was on eliquis he was bleeding and was sent to ED .  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 and sacral necrosis Was started on Vanco and cefepime and I am asked to se patient I spoke to [employer] PA at the wound clinic and he said that a biopsy was done to rule out ostoe and a culture was taken as the wound looked worse than before It isa NGS test and it showed pseudomonas of 88%, acinetobacter of 8%  and many other organisms < 1%   Pt has no fever , is doing okay  Past Medical History:  Diagnosis Date   Acute exacerbation of CHF (congestive heart failure) (HCC)    Anemia    Bipolar disorder (HCC)    Bladder mass    Cellulitis    Chronic anticoagulation    Chronic indwelling Foley catheter    CKD (chronic kidney disease) stage 3, GFR 30-59 ml/min (HCC)    Colostomy in place (HCC)    Diabetes mellitus without complication (HCC)    Frontotemporal dementia (HCC)    Heel ulcer (HCC)    Left   Hematuria    History of blood clots    Hydronephrosis of right kidney     Hyperkalemia    Hypertension    Hyponatremia    Lithium toxicity    Metabolic acidosis    Neurogenic bladder    Obesity    Sacral decubitus ulcer    Sepsis (HCC)    TBI (traumatic brain injury) (HCC)    UTI (urinary tract infection)    VRE (vancomycin resistant enterococcus) culture positive     Past Surgical History:  Procedure Laterality Date   BACK SURGERY     CARPAL TUNNEL RELEASE Bilateral    COLON SURGERY     COLOSTOMY     CYSTOSCOPY W/ RETROGRADES Right 10/25/2022   Procedure: CYSTOSCOPY WITH RETROGRADE PYELOGRAM;  Surgeon: Sondra Come, MD;  Location: [facility] ORS;  Service: Urology;  Laterality: Right;   IR PERC TUN PERIT CATH WO PORT S&I /IMAG  10/11/2021   IR REMOVAL TUN CV CATH W/O FL  11/19/2021   IR US GUIDE VASC ACCESS RIGHT  10/11/2021   SACRAL DECUBITUS ULCER EXCISION     TONSILLECTOMY      Social History   Socioeconomic History   Marital status: Married    Spouse name: thelma   Number of children: 0   Years of education: Not on file   Highest education level: Associate degree: occupational,  technical, or vocational program  Occupational History   Not on file  Tobacco Use   Smoking status: Never    Passive exposure: Never   Smokeless tobacco: Never  Vaping Use   Vaping status: Never Used  Substance and Sexual Activity   Alcohol use: Not Currently   Drug use: No   Sexual activity: Not Currently  Other Topics Concern   Not on file  Social History Narrative   Not on file   Social Drivers of Health   Financial Resource Strain: Low Risk  (12/31/2020)   [facility] from [facility], [facility]   Overall Financial Resource Strain (CARDIA)    Difficulty of Paying Living Expenses: Not very hard  Food Insecurity: Patient Declined (09/16/2023)   Hunger Vital Sign    Worried About Running Out of Food in the Last Year: Patient declined    Ran Out of Food in the Last Year: Patient declined  Transportation Needs: Patient Declined (09/16/2023)    PRAPARE - Administrator, Civil Service (Medical): Patient declined    Lack of Transportation (Non-Medical): Patient declined  Physical Activity: Inactive (09/09/2017)   Exercise Vital Sign    Days of Exercise per Week: 0 days    Minutes of Exercise per Session: 0 min  Stress: No Stress Concern Present (09/09/2017)   [employer] of Occupational Health - Occupational Stress Questionnaire    Feeling of Stress : Not at all  Social Connections: Unknown (09/17/2023)   Social Connection and Isolation Panel [NHANES]    Frequency of Communication with Friends and Family: Patient declined    Frequency of Social Gatherings with Friends and Family: Patient declined    Attends Religious Services: Patient declined    Database administrator or Organizations: Patient declined    Attends Banker Meetings: Patient declined    Marital Status: Married  Catering manager Violence: Patient Declined (09/16/2023)   Humiliation, Afraid, Rape, and Kick questionnaire    Fear of Current or Ex-Partner: Patient declined    Emotionally Abused: Patient declined    Physically Abused: Patient declined    Sexually Abused: Patient declined    Family History  Problem Relation Age of Onset   Depression Father    Deep vein thrombosis Father    No Known Allergies I? Current Facility-Administered Medications  Medication Dose Route Frequency Provider Last Rate Last Admin   acetaminophen (TYLENOL) tablet 650 mg  650 mg Oral Q6H PRN Cox, Amy N, DO       Or   acetaminophen (TYLENOL) suppository 650 mg  650 mg Rectal Q6H PRN Cox, Amy N, DO       Chlorhexidine Gluconate Cloth 2 % PADS 6 each  6 each Topical Daily Charise Killian, MD   6 each at 09/18/23 [phone]   cyanocobalamin (VITAMIN B12) tablet 1,000 mcg  1,000 mcg Oral Daily Cox, Amy N, DO   1,000 mcg at 09/18/23 3086   hydrALAZINE (APRESOLINE) injection 5 mg  5 mg Intravenous Q6H PRN Cox, Amy N, DO       insulin aspart (novoLOG) injection  0-5 Units  0-5 Units Subcutaneous QHS Cox, Amy N, DO       insulin aspart (novoLOG) injection 0-9 Units  0-9 Units Subcutaneous TID WC Cox, Amy N, DO   1 Units at 09/17/23 1812   levofloxacin (LEVAQUIN) tablet 750 mg  750 mg Oral Q48H Evelyna Folker, Rhodia Albright, MD   750 mg at 09/18/23 1830   LORazepam (ATIVAN)  tablet 0.5 mg  0.5 mg Oral Q6H PRN Charise Killian, MD       memantine [facility]) tablet 10 mg  10 mg Oral BID Cox, Amy N, DO   10 mg at 09/18/23 0953   ondansetron (ZOFRAN) tablet 4 mg  4 mg Oral Q6H PRN Cox, Amy N, DO       Or   ondansetron (ZOFRAN) injection 4 mg  4 mg Intravenous Q6H PRN Cox, Amy N, DO       Oxcarbazepine (TRILEPTAL) tablet 300 mg  300 mg Oral BID Cox, Amy N, DO   300 mg at 09/18/23 7829   QUEtiapine (SEROQUEL) tablet 25 mg  25 mg Oral BID PRN Cox, Amy N, DO       rosuvastatin (CRESTOR) tablet 20 mg  20 mg Oral QHS Cox, Amy N, DO   20 mg at 09/17/23 2201     Abtx:  Anti-infectives (From admission, onward)    Start     Dose/Rate Route Frequency Ordered Stop   09/18/23 1800  levofloxacin (LEVAQUIN) tablet 750 mg        750 mg Oral Every 48 hours 09/18/23 1653     09/18/23 1100  vancomycin (VANCOREADY) IVPB 750 mg/150 mL  Status:  Discontinued        750 mg 150 mL/hr over 60 Minutes Intravenous Every 24 hours 09/18/23 0954 09/18/23 1653   09/16/23 2300  ceFEPIme (MAXIPIME) 2 g in sodium chloride 0.9 % 100 mL IVPB  Status:  Discontinued        2 g 200 mL/hr over 30 Minutes Intravenous Every 12 hours 09/16/23 1513 09/18/23 1653   09/16/23 1512  vancomycin variable dose per unstable renal function (pharmacist dosing)  Status:  Discontinued         Does not apply See admin instructions 09/16/23 1512 09/18/23 0953   09/16/23 1500  vancomycin (VANCOCIN) IVPB 1000 mg/200 mL premix       Placed in "And" Linked Group   1,000 mg 200 mL/hr over 60 Minutes Intravenous  Once 09/16/23 1445 09/16/23 1649   09/16/23 1500  vancomycin (VANCOREADY) IVPB 1500 mg/300 mL        Placed in "And" Linked Group   1,500 mg 150 mL/hr over 120 Minutes Intravenous  Once 09/16/23 1445 09/16/23 1744   09/16/23 1500  piperacillin-tazobactam (ZOSYN) IVPB 3.375 g        3.375 g 12.5 mL/hr over 240 Minutes Intravenous  Once 09/16/23 1445 09/16/23 1649       REVIEW OF SYSTEMS:  Const: negative fever, negative chills, negative weight loss Eyes: negative diplopia or visual changes, negative eye pain ENT: negative coryza, negative sore throat Resp: negative cough, hemoptysis, dyspnea Cards: negative for chest pain, palpitations, lower extremity edema GU: negative for frequency, dysuria and hematuria GI: Negative for abdominal pain, diarrhea, bleeding, constipation Skin:as above Heme: negative for easy bruising and gum/nose bleeding FA:[id number] Neurolo:paraparesis Psych: anxiety, depression  Endocrine: , diabetes Allergy/Immunology- negative for any medication or food allergies ?  Objective:  VITALS:  BP 134/84 (BP Location: Left Arm)   Pulse 60   Temp 97.9 F (36.6 C)   Resp 18   Ht 5\' 6"  (1.676 m)   Wt 126.6 kg   SpO2 100%   BMI 45.03 kg/m   PHYSICAL EXAM:  General: Alert, cooperative, no distress, appears stated age.  Head: Normocephalic, without obvious abnormality, atraumatic. Eyes: Conjunctivae clear, anicteric sclerae. Pupils are equal ENT Nares normal. No drainage or sinus tenderness.  Lips, mucosa, and tongue normal. No Thrush Neck: Supple, symmetrical, no adenopathy, thyroid: non tender no carotid bruit and no JVD. Back: sacral ulcer 5 cm- bone seen Red base     Lungs: Clear to auscultation bilaterally. No Wheezing or Rhonchi. No rales. Heart: Regular rate and rhythm, no murmur, rub or gallop. Abdomen: Soft,colostomy Extremities:rt heel ulcer healing well  Skin: No rashes or lesions. Or bruising Lymph: Cervical, supraclavicular normal. Neurologic: paraplegia Pertinent Labs Lab Results CBC    Component Value Date/Time   WBC 4.3  09/18/2023 0510   RBC 2.65 (L) 09/18/2023 0510   HGB 7.1 (L) 09/18/2023 0510   HCT 23.4 (L) 09/18/2023 0510   PLT 200 09/18/2023 0510   MCV 88.3 09/18/2023 0510   MCH 26.8 09/18/2023 0510   MCHC 30.3 09/18/2023 0510   RDW 16.9 (H) 09/18/2023 0510   LYMPHSABS 1.1 07/22/2023 0439   MONOABS 0.6 07/22/2023 0439   EOSABS 0.6 (H) 07/22/2023 0439   BASOSABS 0.0 07/22/2023 0439       Latest Ref Rng & Units 09/18/2023    5:10 AM 09/17/2023    1:59 AM 09/16/2023   11:23 AM  CMP  Glucose 70 - 99 mg/dL 295  284  132   BUN 8 - 23 mg/dL 42  45  49   Creatinine 0.61 - 1.24 mg/dL 4.40  1.02  7.25   Sodium 135 - 145 mmol/L 138  132  134   Potassium 3.5 - 5.1 mmol/L 5.0  5.1  5.5   Chloride 98 - 111 mmol/L 113  109  111   CO2 22 - 32 mmol/L 19  17  14    Calcium 8.9 - 10.3 mg/dL 8.3  8.2  8.6   Total Protein 6.5 - 8.1 g/dL   7.9   Total Bilirubin 0.0 - 1.2 mg/dL   0.6   Alkaline Phos 38 - 126 U/L   69   AST 15 - 41 U/L   22   ALT 0 - 44 U/L   19       Microbiology: Recent Results (from the past 240 hours)  Culture, blood (Routine X 2) w Reflex to ID Panel     Status: None (Preliminary result)   Collection Time: 09/16/23  3:24 PM   Specimen: BLOOD  Result Value Ref Range Status   Specimen Description BLOOD BLOOD RIGHT HAND  Final   Special Requests   Final    BOTTLES DRAWN AEROBIC AND ANAEROBIC Blood Culture results may not be optimal due to an inadequate volume of blood received in culture bottles   Culture   Final    NO GROWTH 2 DAYS Performed at [facility], [address], [place], [place] [address]    Report Status PENDING  Incomplete  Culture, blood (Routine X 2) w Reflex to ID Panel     Status: None (Preliminary result)   Collection Time: 09/16/23  3:24 PM   Specimen: BLOOD  Result Value Ref Range Status   Specimen Description BLOOD LEFT ANTECUBITAL  Final   Special Requests   Final    BOTTLES DRAWN AEROBIC AND ANAEROBIC Blood Culture results may not be optimal  due to an inadequate volume of blood received in culture bottles   Culture   Final    NO GROWTH 2 DAYS Performed at [facility], [address], [place], [place] [address]    Report Status PENDING  Incomplete  Urine Culture     Status: Abnormal  Collection Time: 09/16/23  3:24 PM   Specimen: Urine, Random  Result Value Ref Range Status   Specimen Description   Final    URINE, RANDOM Performed at [facility], [address] [place], [place] [address]    Special Requests   Final    NONE Reflexed from [id number] Performed at [facility], [address] [place], [place] [address]    Culture >=100,000 COLONIES/mL PROTEUS MIRABILIS (A)  Final   Report Status 09/18/2023 FINAL  Final   Organism ID, Bacteria PROTEUS MIRABILIS (A)  Final      Susceptibility   Proteus mirabilis - MIC*    AMPICILLIN <=2 SENSITIVE Sensitive     CEFAZOLIN <=4 SENSITIVE Sensitive     CEFEPIME <=0.12 SENSITIVE Sensitive     CEFTRIAXONE <=0.25 SENSITIVE Sensitive     CIPROFLOXACIN <=0.25 SENSITIVE Sensitive     GENTAMICIN <=1 SENSITIVE Sensitive     IMIPENEM 2 SENSITIVE Sensitive     NITROFURANTOIN 128 RESISTANT Resistant     TRIMETH/SULFA <=20 SENSITIVE Sensitive     AMPICILLIN/SULBACTAM <=2 SENSITIVE Sensitive     PIP/TAZO <=4 SENSITIVE Sensitive ug/mL    * >=100,000 COLONIES/mL PROTEUS MIRABILIS  MRSA Next Gen by PCR, Nasal     Status: Abnormal   Collection Time: 09/18/23  4:20 PM   Specimen: Nasal Mucosa; Nasal Swab  Result Value Ref Range Status   MRSA by PCR Next Gen DETECTED (A) NOT DETECTED Final    Comment: RESULT CALLED TO, READ BACK BY AND VERIFIED WITH: Melissa Dunlap @1834  on 09/18/23 SKL (NOTE) The GeneXpert MRSA Assay (FDA approved for NASAL specimens only), is one component of a comprehensive MRSA colonization surveillance program. It is not intended to diagnose MRSA infection nor to guide or monitor treatment for MRSA infections. Test performance is not  FDA approved in patients less than 57 years old. Performed at [facility], [address] [place], [place] [address]     IMAGING RESULTS: I have personally reviewed the films ? Impression/Recommendation ? 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 from stage 3-4 Now it is slightly bigger and recently had biopsy of bone and culture-  The culture was a genetic test NGS It is pseudomonas predominantly 89% and acinetobacter 8% Will treat that with levaquin 750mg  adjusted for cr cl ( q 48hrs)  for 2 weeks like soft tissue infection Will not treat the numeorus other organisms of < 1% As he has chronic osteo he does not need treatment for that  Off loading and possible hyperbaric oxygen in the wound clinic  Discussed the management with the wound clinic consultant  Anemia  CKD  Paraparesis Neurogenic bladder- has foley  Colostomy  HTN  Management as per Primary team  GAD On lorazepam  ? ________________________________________________ Discussed with patient, requesting provider and with Mr.Hoyt stone Note:  This document was prepared using Dragon voice recognition software and may include unintentional dictation errors.

## 2023-09-18 NOTE — Plan of Care (Signed)
  Problem: Clinical Measurements: Goal: Diagnostic test results will improve Outcome: Progressing   Problem: Activity: Goal: Risk for activity intolerance will decrease Outcome: Progressing   Problem: Nutrition: Goal: Adequate nutrition will be maintained Outcome: Progressing   Problem: Coping: Goal: Level of anxiety will decrease Outcome: Progressing   Problem: Safety: Goal: Ability to remain free from injury will improve Outcome: Progressing

## 2023-09-18 NOTE — Progress Notes (Signed)
 Pharmacy Antibiotic Note  [name] is a 68 y.o. male admitted on 09/16/2023 with cellulitis and wound infection .  PMH includes morbid obesity, sacral osteomyelitis with MSSA Bacteremia (2023), and Ecoli bacteremia. Patient has a wound to his buttocks area that was bleeding on evaluation at wound clinic and was advised to come to ER. Pharmacy has been consulted for vancomycin and cefepime dosing.  Patient in mild AKI upon admission; renal function improved today, nearly back at baseline.  Plan: Vancomycin random 10 after ~42 hours of loading dose of 2500 mg x 1. Will start vancomycin 750 mg q24H. Predicted AUC of 436. Scr 2.33, Vd 0.5, IBW. Goal AUC 400-600. Plan to obtain vancomycin levels after 4th dose or if Scr trends up.   Continue cefepime 2 g BID.    Height: 5\' 6"  (167.6 cm) Weight: 126.6 kg (279 lb) IBW/kg (Calculated) : 63.8  Temp (24hrs), Avg:98.3 F (36.8 C), Min:97.9 F (36.6 C), Max:98.6 F (37 C)  Recent Labs  Lab 09/16/23 1123 09/16/23 1241 09/16/23 1524 09/17/23 0159 09/18/23 0510  WBC  --  7.7  --  5.4 4.3  CREATININE 2.85*  --   --  2.58* 2.33*  VANCORANDOM  --   --  15  --  10    Estimated Creatinine Clearance: 38.2 mL/min (A) (by C-G formula based on SCr of 2.33 mg/dL (H)).    No Known Allergies  Antimicrobials this admission: 3/18 Vanc >>  3/18 Cefepime>> 3/18 Zosyn x 1  Microbiology results: None  Thank you for allowing pharmacy to be a part of this patient's care.  Paschal Dopp, PharmD Clinical Pharmacist 09/18/2023 9:50 AM

## 2023-09-19 DIAGNOSIS — M4628 Osteomyelitis of vertebra, sacral and sacrococcygeal region: Secondary | ICD-10-CM | POA: Diagnosis not present

## 2023-09-19 DIAGNOSIS — L89154 Pressure ulcer of sacral region, stage 4: Secondary | ICD-10-CM | POA: Diagnosis not present

## 2023-09-19 DIAGNOSIS — N390 Urinary tract infection, site not specified: Secondary | ICD-10-CM | POA: Diagnosis not present

## 2023-09-19 LAB — BASIC METABOLIC PANEL
Anion gap: 5 (ref 5–15)
BUN: 37 mg/dL — ABNORMAL HIGH (ref 8–23)
CO2: 18 mmol/L — ABNORMAL LOW (ref 22–32)
Calcium: 8.2 mg/dL — ABNORMAL LOW (ref 8.9–10.3)
Chloride: 114 mmol/L — ABNORMAL HIGH (ref 98–111)
Creatinine, Ser: 2.09 mg/dL — ABNORMAL HIGH (ref 0.61–1.24)
GFR, Estimated: 34 mL/min — ABNORMAL LOW (ref >60)
Glucose, Bld: 123 mg/dL — ABNORMAL HIGH (ref 70–99)
Potassium: 5 mmol/L (ref 3.5–5.1)
Sodium: 137 mmol/L (ref 135–145)

## 2023-09-19 LAB — CBC
HCT: 22.1 % — ABNORMAL LOW (ref 39.0–52.0)
Hemoglobin: 6.9 g/dL — ABNORMAL LOW (ref 13.0–17.0)
MCH: 26.7 pg (ref 26.0–34.0)
MCHC: 31.2 g/dL (ref 30.0–36.0)
MCV: 85.7 fL (ref 80.0–100.0)
Platelets: 201 10*3/uL (ref 150–400)
RBC: 2.58 MIL/uL — ABNORMAL LOW (ref 4.22–5.81)
RDW: 16.9 % — ABNORMAL HIGH (ref 11.5–15.5)
WBC: 4 10*3/uL (ref 4.0–10.5)
nRBC: 0 % (ref 0.0–0.2)

## 2023-09-19 LAB — GLUCOSE, CAPILLARY
Glucose-Capillary: 111 mg/dL — ABNORMAL HIGH (ref 70–99)
Glucose-Capillary: 205 mg/dL — ABNORMAL HIGH (ref 70–99)
Glucose-Capillary: 118 mg/dL — ABNORMAL HIGH (ref 70–99)
Glucose-Capillary: 135 mg/dL — ABNORMAL HIGH (ref 70–99)

## 2023-09-19 LAB — HEMOGLOBIN AND HEMATOCRIT, BLOOD
HCT: 27 % — ABNORMAL LOW (ref 39.0–52.0)
Hemoglobin: 8.5 g/dL — ABNORMAL LOW (ref 13.0–17.0)

## 2023-09-19 LAB — PREPARE RBC (CROSSMATCH)

## 2023-09-19 MED ORDER — SODIUM CHLORIDE 0.9% IV SOLUTION: Freq: Once | INTRAVENOUS | Status: AC

## 2023-09-19 MED ORDER — SODIUM BICARBONATE 650 MG PO TABS
650.0000 mg | ORAL_TABLET | Freq: Two times a day (BID) | ORAL | Status: AC
  Administered 2023-09-19 – 2023-09-21 (×4): 650 mg via ORAL
  Filled 2023-09-19 (×4): qty 1

## 2023-09-19 NOTE — Progress Notes (Signed)
 Date of Admission:  09/16/2023      ID: [name] is a 68 y.o. 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)   Colostomy in place [facility])   TBI (traumatic brain injury) (HCC)   Sacral decubitus ulcer   AKI (acute kidney injury) (HCC)   Essential hypertension   Metabolic acidosis    Subjective: Pt doing okay Wife at bed side  Medications:   Chlorhexidine Gluconate Cloth  6 each Topical Daily   cyanocobalamin  1,000 mcg Oral Daily   insulin aspart  0-5 Units Subcutaneous QHS   insulin aspart  0-9 Units Subcutaneous TID WC   levofloxacin  750 mg Oral Q48H   memantine  10 mg Oral BID   Oxcarbazepine  300 mg Oral BID   rosuvastatin  20 mg Oral QHS    Objective: Vital signs in last 24 hours: Patient Vitals for the past 24 hrs:  BP Temp Temp src Pulse Resp SpO2  09/19/23 0737 126/70 97.7 F (36.5 C) Oral 63 15 97 %  09/19/23 0439 135/80 97.7 F (36.5 C) -- (!) 59 18 100 %  09/18/23 2029 134/84 97.9 F (36.6 C) -- 60 18 100 %  09/18/23 1730 (!) 106/53 97.7 F (36.5 C) Oral (!) 58 16 95 %       PHYSICAL EXAM:  General: Alert, cooperative, no distress, appears stated age.  Lungs: Clear to auscultation bilaterally. No Wheezing or Rhonchi. No rales. Heart: Regular rate and rhythm, no murmur, rub or gallop. Abdomen: Soft,colostomy foley Extremities: rt heel pressure wound healing Sacral  decubitus stage IV     Skin: No rashes or lesions. Or bruising Lymph: Cervical, supraclavicular normal. Neurologic: Grossly non-focal  Lab Results    Latest Ref Rng & Units 09/19/2023    4:03 AM 09/18/2023    5:10 AM 09/17/2023    1:59 AM  CBC  WBC 4.0 - 10.5 K/uL 4.0  4.3  5.4   Hemoglobin 13.0 - 17.0 g/dL 6.9  7.1  7.5   Hematocrit 39.0 - 52.0 % 22.1  23.4  24.1   Platelets 150 - 400 K/uL 201  200  210        Latest Ref Rng & Units 09/19/2023    4:03 AM 09/18/2023    5:10 AM 09/17/2023    1:59 AM  CMP  Glucose 70 - 99 mg/dL 629  528  413   BUN 8 - 23 mg/dL 37  42  45   Creatinine 0.61 - 1.24 mg/dL 2.44  0.10  2.72   Sodium 135 - 145 mmol/L 137  138  132   Potassium 3.5 - 5.1 mmol/L 5.0  5.0  5.1   Chloride 98 - 111 mmol/L 114  113  109   CO2 22 - 32 mmol/L 18  19  17    Calcium 8.9 - 10.3 mg/dL 8.2  8.3  8.2       Microbiology:  Culture sent Studies/Results: No results found.   Assessment/Plan: Chronic sacral ulcer for many years- has ranged from stage 3-4 Now it is slightly bigger and recently had biopsy of bone and culture-  The culture was a genetic test NGS It is pseudomonas predominantly 89% and acinetobacter 8% Will treat that with levaquin 750mg  adjusted for cr cl ( q 48hrs)  for 2 weeks like soft tissue infection 10/03/23  Check cbc/cmp weekly Will not treat the numeorus other organisms of < 1% As he has chronic osteo of the sacrum  - may not be able to eradicate that , so treat like soft tissue   Off loading and possible hyperbaric oxygen in the wound clinic   Discussed the management with the wound clinic consultant   Anemia   CKD   Paraparesis Neurogenic bladder- has foley   Colostomy   HTN   Management as per Primary team   GAD On lorazepam  Discussed the management with the patient and hs wife Follow up with wound clinic ID will sign off Call if needed

## 2023-09-19 NOTE — Care Management Important Message (Signed)
 Important Message  Patient Details  Name: [name] MRN: [id number] Date of Birth: 1956-01-01   Important Message Given:  Yes - Medicare IM     Cristela Blue, CMA 09/19/2023, 11:27 AM

## 2023-09-19 NOTE — Progress Notes (Addendum)
 PROGRESS NOTE    [name]  VHQ:[id number] DOB: 04/03/1956 DOA: 09/16/2023 PCP: Dione Booze, MD   Assessment & Plan:   Principal Problem:   Hyperkalemia Active Problems:   Chronic anticoagulation - on Eliquis   Sacral osteomyelitis (HCC)   Sacral decubitus ulcer   Type II diabetes mellitus with renal manifestations (HCC)   History of DVT (deep vein thrombosis)   Stage 3b chronic kidney disease (CKD) (HCC) - baseline SCr 1.8-1.9   TBI (traumatic brain injury) (HCC)   AKI (acute kidney injury) (HCC)   Essential hypertension   Metabolic acidosis   Hyperlipidemia   Vasculogenic erectile dysfunction   Urinary retention   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 S/p TXA. Sedentary lifestyle. Blood cxs NGTD. No debridement needed as per gen surg. Will d/c home on levaquin x 2 weeks as per ID.  UTI: urine cx growing proteus. Continue on levaquin   Metabolic acidosis: labile. Continue on bicarb    HTN: holding amlodipine, olmesartan secondary to AKI. BP is currently WNL   CKDIIIb: Cr is trending down again today. Avoid nephrotoxic meds    Hx of DVT: continue holding home dose of eliquis secondary to sacral wound bleeding requiring TXA.    DM2: well controlled, HbA1c 5.9 in 07/2023. Continue w/ SSI w/ accuchecks    Colostomy: continue w/ supportive care    Major neurocognitive disorder: secondary to 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 lobar degeneration. Continue on home dose of seroquel   Chronic diastolic CHF: appears euvolemic. Monitor I/Os. Holding olmesartan   Morbid obesity: BMI 45.0. Complicates overall care & prognosis   Bipolar  disorder: continue on home dose of oxcarbazepine    GAD: severity unknown. Ativan prn   Normocytic anemia: will transfuse 1 unit of pRBCs as Hb is 6.9. Repeat H&H ordered    DVT prophylaxis: SCDs Code Status: full Family Communication: discussed pt's care w/ pt's wife, [name], and answered her questions Disposition Plan: likely d/c back home   Level of care: Telemetry Medical  Status is: Inpatient Remains inpatient appropriate because: severity of illness, requiring pRBCs transfusion today      Consultants:  ID  Procedures:   Antimicrobials: levaquin    Subjective: Pt c/o fatigue   Objective: Vitals:   09/18/23 1730 09/18/23 2029 09/19/23 0439 09/19/23 0737  BP: (!) 106/53 134/84 135/80 126/70  Pulse: (!) 58 60 (!) 59 63  Resp: 16 18 18 15   Temp: 97.7 F (36.5 C) 97.9 F (36.6 C) 97.7 F (36.5 C) 97.7 F (36.5 C)  TempSrc: Oral   Oral  SpO2: 95% 100% 100% 97%  Weight:      Height:        Intake/Output Summary (Last 24 hours) at 09/19/2023 0849 Last data filed at 09/19/2023 0800 Gross per 24 hour  Intake 450 ml  Output 2300 ml  Net -1850 ml   Filed Weights   09/16/23 1119  Weight: 126.6 kg    Examination:  General exam: Appears calm & comfortable. Morbidly obese  Respiratory system: clear breath sounds b/l  Cardiovascular system: S1 & S2+. No rubs or clicks  Gastrointestinal system: abd is soft, NT, obese & normal bowel sounds  Central nervous  system: alert & awake. Moves all extremities  Psychiatry: Judgement and insight appears at baseline. Flat mood and affect    Data Reviewed: I have personally reviewed following labs and imaging studies  CBC: Recent Labs  Lab 09/16/23 1241 09/17/23 0159 09/18/23 0510 09/19/23 0403  WBC 7.7 5.4 4.3 4.0  HGB 8.0* 7.5* 7.1* 6.9*  HCT 25.6* 24.1* 23.4* 22.1*  MCV 88.9 87.0 88.3 85.7  PLT 215 210 200 201   Basic Metabolic Panel: Recent Labs  Lab 09/16/23 1123 09/17/23 0159 09/18/23 0510  09/19/23 0403  NA 134* 132* 138 137  K 5.5* 5.1 5.0 5.0  CL 111 109 113* 114*  CO2 14* 17* 19* 18*  GLUCOSE 131* 149* 115* 123*  BUN 49* 45* 42* 37*  CREATININE 2.85* 2.58* 2.33* 2.09*  CALCIUM 8.6* 8.2* 8.3* 8.2*   GFR: Estimated Creatinine Clearance: 42.5 mL/min (A) (by C-G formula based on SCr of 2.09 mg/dL (H)). Liver Function Tests: Recent Labs  Lab 09/16/23 1123  AST 22  ALT 19  ALKPHOS 69  BILITOT 0.6  PROT 7.9  ALBUMIN 3.0*   No results for input(s): "LIPASE", "AMYLASE" in the last 168 hours. No results for input(s): "AMMONIA" in the last 168 hours. Coagulation Profile: Recent Labs  Lab 09/16/23 1241  INR 1.5*   Cardiac Enzymes: No results for input(s): "CKTOTAL", "CKMB", "CKMBINDEX", "TROPONINI" in the last 168 hours. BNP (last 3 results) No results for input(s): "PROBNP" in the last 8760 hours. HbA1C: No results for input(s): "HGBA1C" in the last 72 hours. CBG: Recent Labs  Lab 09/18/23 0805 09/18/23 1135 09/18/23 1701 09/18/23 2120 09/19/23 0749  GLUCAP 98 138* 111* 205* 118*   Lipid Profile: No results for input(s): "CHOL", "HDL", "LDLCALC", "TRIG", "CHOLHDL", "LDLDIRECT" in the last 72 hours. Thyroid Function Tests: No results for input(s): "TSH", "T4TOTAL", "FREET4", "T3FREE", "THYROIDAB" in the last 72 hours. Anemia Panel: No results for input(s): "VITAMINB12", "FOLATE", "FERRITIN", "TIBC", "IRON", "RETICCTPCT" in the last 72 hours. Sepsis Labs: No results for input(s): "PROCALCITON", "LATICACIDVEN" in the last 168 hours.  Recent Results (from the past 240 hours)  Culture, blood (Routine X 2) w Reflex to ID Panel     Status: None (Preliminary result)   Collection Time: 09/16/23  3:24 PM   Specimen: BLOOD  Result Value Ref Range Status   Specimen Description BLOOD BLOOD RIGHT HAND  Final   Special Requests   Final    BOTTLES DRAWN AEROBIC AND ANAEROBIC Blood Culture results may not be optimal due to an inadequate volume of blood received  in culture bottles   Culture   Final    NO GROWTH 3 DAYS Performed at [facility] [address] [place], [place] [address]    Report Status PENDING  Incomplete  Culture, blood (Routine X 2) w Reflex to ID Panel     Status: None (Preliminary result)   Collection Time: 09/16/23  3:24 PM   Specimen: BLOOD  Result Value Ref Range Status   Specimen Description BLOOD LEFT ANTECUBITAL  Final   Special Requests   Final    BOTTLES DRAWN AEROBIC AND ANAEROBIC Blood Culture results may not be optimal due to an inadequate volume of blood received in culture bottles   Culture   Final    NO GROWTH 3 DAYS Performed at [facility], [address], [place], [place] [address]    Report Status PENDING  Incomplete  Urine Culture     Status: Abnormal   Collection Time:  09/16/23  3:24 PM   Specimen: Urine, Random  Result Value Ref Range Status   Specimen Description   Final    URINE, RANDOM Performed at [facility], [address] [place], [place] [address]    Special Requests   Final    NONE Reflexed from [id number] Performed at [facility], [address] [place], [place] [address]    Culture >=100,000 COLONIES/mL PROTEUS MIRABILIS (A)  Final   Report Status 09/18/2023 FINAL  Final   Organism ID, Bacteria PROTEUS MIRABILIS (A)  Final      Susceptibility   Proteus mirabilis - MIC*    AMPICILLIN <=2 SENSITIVE Sensitive     CEFAZOLIN <=4 SENSITIVE Sensitive     CEFEPIME <=0.12 SENSITIVE Sensitive     CEFTRIAXONE <=0.25 SENSITIVE Sensitive     CIPROFLOXACIN <=0.25 SENSITIVE Sensitive     GENTAMICIN <=1 SENSITIVE Sensitive     IMIPENEM 2 SENSITIVE Sensitive     NITROFURANTOIN 128 RESISTANT Resistant     TRIMETH/SULFA <=20 SENSITIVE Sensitive     AMPICILLIN/SULBACTAM <=2 SENSITIVE Sensitive     PIP/TAZO <=4 SENSITIVE Sensitive ug/mL    * >=100,000 COLONIES/mL PROTEUS MIRABILIS  Aerobic Culture w Gram Stain (superficial specimen)     Status: None  (Preliminary result)   Collection Time: 09/18/23  4:00 PM   Specimen: Wound  Result Value Ref Range Status   Specimen Description   Final    WOUND Performed at [facility], [address] [place], [place] [address]    Special Requests   Final    NONE Performed at [facility], [address] [place], [place] [address]    Gram Stain   Final    RARE WBC PRESENT, PREDOMINANTLY PMN RARE GRAM POSITIVE COCCI Performed at [facility], [address], [place], [place] [address]    Culture PENDING  Incomplete   Report Status PENDING  Incomplete  MRSA Next Gen by PCR, Nasal     Status: Abnormal   Collection Time: 09/18/23  4:20 PM   Specimen: Nasal Mucosa; Nasal Swab  Result Value Ref Range Status   MRSA by PCR Next Gen DETECTED (A) NOT DETECTED Final    Comment: RESULT CALLED TO, READ BACK BY AND VERIFIED WITH: Melissa Dunlap @1834  on 09/18/23 SKL (NOTE) The GeneXpert MRSA Assay (FDA approved for NASAL specimens only), is one component of a comprehensive MRSA colonization surveillance program. It is not intended to diagnose MRSA infection nor to guide or monitor treatment for MRSA infections. Test performance is not FDA approved in patients less than 70 years old. Performed at [facility], [address], [place], [place] [address]          Radiology Studies: No results found.       Scheduled Meds:  sodium chloride   Intravenous Once   Chlorhexidine Gluconate Cloth  6 each Topical Daily   cyanocobalamin  1,000 mcg Oral Daily   insulin aspart  0-5 Units Subcutaneous QHS   insulin aspart  0-9 Units Subcutaneous TID WC   levofloxacin  750 mg Oral Q48H   memantine  10 mg Oral BID   Oxcarbazepine  300 mg Oral BID   rosuvastatin  20 mg Oral QHS   Continuous Infusions:     LOS: 3 days       Charise Killian, MD [facility] Pager [phone]  If 7PM-7AM, please contact night-coverage [email or web address] 09/19/2023,  8:49 AM

## 2023-09-19 NOTE — Consult Note (Signed)
 WOC Nurse Consult Note: see previous WOC consult note 09/17/2023; patient with chronic Stage 4 sacral wound, followed at [facility]. Had been evaluated by surgery on admission and packed with Surgicel for bleeding;  patient followed at [facility] (last seen 09/16/2023) where [facility] is being utilized for both wounds; Hydrofera Blue not on formulary at [employer] is substitute  Reason for Consult: stage 4 sacral wound  Wound type: Pressure  Pressure Injury POA: Yes Measurement: see nursing flowsheet; per [facility] note 3/18 4.5 cm x 3 cm x 5.2 cm  Wound bed:red moist  Drainage (amount, consistency, odor) see nursing flowsheet  Periwound: scar tissue  Dressing procedure/placement/frequency:Cleanse sacral wound with Vashe wound cleanser([facility] #[id number]), apply Silver hydrofiber [place] [phone]) to wound bed making sure to cover all areas of undermining and fill entire wound. Cover with dry gauze and silicone foam.   POC discussed with bedside nurse. WOC team will not follow. Patient should resume follow-up wound care center orders for wound care once discharged [facility]) and keep follow-up appointments with them.   Thank you,    Priscella Mann MSN, RN-BC, [employer] [phone]

## 2023-09-19 NOTE — Plan of Care (Signed)
  Problem: Clinical Measurements: Goal: Diagnostic test results will improve Outcome: Progressing   Problem: Nutrition: Goal: Adequate nutrition will be maintained Outcome: Progressing   Problem: Coping: Goal: Level of anxiety will decrease Outcome: Progressing   Problem: Safety: Goal: Ability to remain free from injury will improve Outcome: Progressing   Problem: Skin Integrity: Goal: Risk for impaired skin integrity will decrease Outcome: Progressing   Problem: Coping: Goal: Ability to adjust to condition or change in health will improve Outcome: Progressing

## 2023-09-20 DIAGNOSIS — E875 Hyperkalemia: Secondary | ICD-10-CM | POA: Diagnosis not present

## 2023-09-20 LAB — BASIC METABOLIC PANEL
Anion gap: 5 (ref 5–15)
BUN: 35 mg/dL — ABNORMAL HIGH (ref 8–23)
CO2: 18 mmol/L — ABNORMAL LOW (ref 22–32)
Calcium: 8.5 mg/dL — ABNORMAL LOW (ref 8.9–10.3)
Chloride: 114 mmol/L — ABNORMAL HIGH (ref 98–111)
Creatinine, Ser: 2.13 mg/dL — ABNORMAL HIGH (ref 0.61–1.24)
GFR, Estimated: 33 mL/min — ABNORMAL LOW (ref >60)
Glucose, Bld: 129 mg/dL — ABNORMAL HIGH (ref 70–99)
Potassium: 5.3 mmol/L — ABNORMAL HIGH (ref 3.5–5.1)
Sodium: 137 mmol/L (ref 135–145)

## 2023-09-20 LAB — CBC
HCT: 25.7 % — ABNORMAL LOW (ref 39.0–52.0)
Hemoglobin: 8.3 g/dL — ABNORMAL LOW (ref 13.0–17.0)
MCH: 27.9 pg (ref 26.0–34.0)
MCHC: 32.3 g/dL (ref 30.0–36.0)
MCV: 86.5 fL (ref 80.0–100.0)
Platelets: 210 10*3/uL (ref 150–400)
RBC: 2.97 MIL/uL — ABNORMAL LOW (ref 4.22–5.81)
RDW: 16.9 % — ABNORMAL HIGH (ref 11.5–15.5)
WBC: 4.7 10*3/uL (ref 4.0–10.5)
nRBC: 0 % (ref 0.0–0.2)

## 2023-09-20 LAB — BPAM RBC
Blood Product Expiration Date: 202504202359
ISSUE DATE / TIME: 202503211042
Unit Type and Rh: 6200

## 2023-09-20 LAB — TYPE AND SCREEN
ABO/RH(D): A POS
Antibody Screen: NEGATIVE
Unit division: 0

## 2023-09-20 LAB — POTASSIUM: Potassium: 5.2 mmol/L — ABNORMAL HIGH (ref 3.5–5.1)

## 2023-09-20 MED ORDER — FUROSEMIDE 10 MG/ML IJ SOLN
40.0000 mg | Freq: Once | INTRAMUSCULAR | Status: AC
  Administered 2023-09-20: 40 mg via INTRAVENOUS
  Filled 2023-09-20: qty 4

## 2023-09-20 MED ORDER — SODIUM ZIRCONIUM CYCLOSILICATE 10 G PO PACK
10.0000 g | PACK | Freq: Once | ORAL | Status: AC
  Administered 2023-09-20: 10 g via ORAL
  Filled 2023-09-20: qty 1

## 2023-09-20 MED ORDER — PATIROMER SORBITEX CALCIUM 8.4 G PO PACK
8.4000 g | PACK | Freq: Once | ORAL | Status: AC
  Administered 2023-09-20: 8.4 g via ORAL
  Filled 2023-09-20: qty 1

## 2023-09-20 MED ORDER — AMLODIPINE BESYLATE 5 MG PO TABS
5.0000 mg | ORAL_TABLET | Freq: Every day | ORAL | Status: DC
  Administered 2023-09-20 – 2023-09-21 (×2): 5 mg via ORAL
  Filled 2023-09-20 (×2): qty 1

## 2023-09-20 NOTE — Progress Notes (Signed)
 PROGRESS NOTE    [name]  ZOX:[id number] DOB: 05-Sep-1955 DOA: 09/16/2023 PCP: Dione Booze, MD   Assessment & Plan:   Principal Problem:   Hyperkalemia Active Problems:   Chronic anticoagulation - on Eliquis   Sacral osteomyelitis (HCC)   Sacral decubitus ulcer   Type II diabetes mellitus with renal manifestations (HCC)   History of DVT (deep vein thrombosis)   Stage 3b chronic kidney disease (CKD) (HCC) - baseline SCr 1.8-1.9   TBI (traumatic brain injury) (HCC)   AKI (acute kidney injury) (HCC)   Essential hypertension   Metabolic acidosis   Hyperlipidemia   Vasculogenic erectile dysfunction   Urinary retention   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 S/p lokelma. Lasix, veltassa ordered.    Sacral osteomyelitis: w/ acute on chronic bleed. S/p TXA. Sedentary lifestyle. Blood cxs NGTD. No debridement needed as per gen surg. Continue on levaquin x 2 weeks as per ID   UTI: urine cx growing proteus. Continue on levaquin   Metabolic acidosis: labile. Continue on bicarb   HTN: restart home dose of amlodipine. Holding olmesartan secondary to AKI    AKI:  Cr is labile. Avoid nephrotoxic meds    Hx of DVT:  holding home dose of eliquis secondary to sacral wound bleeding requiring TXA. Can likely restart eliquis tomorrow if H&H are stable and/or trending up     DM2: well controlled, HbA1c 5.9 in 07/2023. Continue w/ SSI w/ accuchecks   Colostomy: continue w/ supportive care    Major neurocognitive disorder: secondary to 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 lobar degeneration. Continue on home dose of seroquel   Chronic diastolic CHF: appears euvolemic. Monitor I/Os. Holding  olmesartan   Morbid obesity: BMI 45.0. Complicates overall care & prognosis   Bipolar disorder: continue on home dose of oxcarbazepine    GAD: severity unknown. Ativan prn   Normocytic anemia: s/p 1 unit of pRBCs transfused. H&H are trending up    DVT prophylaxis: SCDs Code Status: full Family Communication: d Disposition Plan: likely d/c back home   Level of care: Telemetry Medical  Status is: Inpatient Remains inpatient appropriate because: severity of illness, hyperkalemia again today       Consultants:  ID  Procedures:   Antimicrobials: levaquin    Subjective: Pt c/o malaise   Objective: Vitals:   09/19/23 1656 09/19/23 1936 09/20/23 0408 09/20/23 0733  BP: (!) 149/70 (!) 153/84 (!) 142/73 (!) 147/71  Pulse: (!) 59 (!) 58 (!) 55 62  Resp: 16 18 18 16   Temp: 97.7 F (36.5 C) 97.6 F (36.4 C) (!) 97.5 F (36.4 C) 97.9 F (36.6 C)  TempSrc:      SpO2: 94% 94% 100% 100%  Weight:      Height:        Intake/Output Summary (Last 24 hours) at 09/20/2023 0845 Last data filed at 09/20/2023 0037 Gross per 24 hour  Intake 513.83 ml  Output 1350 ml  Net -836.17 ml   Filed Weights   09/16/23 1119  Weight: 126.6 kg    Examination:  General exam: Appears comfortable. Morbidly obese Respiratory system: clear breath sounds b/l  Cardiovascular system: S1/S2+. No rubs or clicks  Gastrointestinal system: abd is soft, NT, obese, normal bowel sounds. Ostomy  present Central nervous system: alert & awake.  Psychiatry: Judgement and insight appears at baseline. Flat mood and affect    Data Reviewed: I have personally reviewed following labs and imaging studies  CBC: Recent Labs  Lab 09/16/23 1241 09/17/23 0159 09/18/23 0510 09/19/23 0403 09/19/23 2023 09/20/23 0539  WBC 7.7 5.4 4.3 4.0  --  4.7  HGB 8.0* 7.5* 7.1* 6.9* 8.5* 8.3*  HCT 25.6* 24.1* 23.4* 22.1* 27.0* 25.7*  MCV 88.9 87.0 88.3 85.7  --  86.5  PLT 215 210 200 201  --  210   Basic  Metabolic Panel: Recent Labs  Lab 09/16/23 1123 09/17/23 0159 09/18/23 0510 09/19/23 0403 09/20/23 0539  NA 134* 132* 138 137 137  K 5.5* 5.1 5.0 5.0 5.3*  CL 111 109 113* 114* 114*  CO2 14* 17* 19* 18* 18*  GLUCOSE 131* 149* 115* 123* 129*  BUN 49* 45* 42* 37* 35*  CREATININE 2.85* 2.58* 2.33* 2.09* 2.13*  CALCIUM 8.6* 8.2* 8.3* 8.2* 8.5*   GFR: Estimated Creatinine Clearance: 41.7 mL/min (A) (by C-G formula based on SCr of 2.13 mg/dL (H)). Liver Function Tests: Recent Labs  Lab 09/16/23 1123  AST 22  ALT 19  ALKPHOS 69  BILITOT 0.6  PROT 7.9  ALBUMIN 3.0*   No results for input(s): "LIPASE", "AMYLASE" in the last 168 hours. No results for input(s): "AMMONIA" in the last 168 hours. Coagulation Profile: Recent Labs  Lab 09/16/23 1241  INR 1.5*   Cardiac Enzymes: No results for input(s): "CKTOTAL", "CKMB", "CKMBINDEX", "TROPONINI" in the last 168 hours. BNP (last 3 results) No results for input(s): "PROBNP" in the last 8760 hours. HbA1C: No results for input(s): "HGBA1C" in the last 72 hours. CBG: Recent Labs  Lab 09/18/23 1135 09/18/23 1701 09/18/23 2120 09/19/23 0749 09/19/23 1146  GLUCAP 138* 111* 205* 118* 135*   Lipid Profile: No results for input(s): "CHOL", "HDL", "LDLCALC", "TRIG", "CHOLHDL", "LDLDIRECT" in the last 72 hours. Thyroid Function Tests: No results for input(s): "TSH", "T4TOTAL", "FREET4", "T3FREE", "THYROIDAB" in the last 72 hours. Anemia Panel: No results for input(s): "VITAMINB12", "FOLATE", "FERRITIN", "TIBC", "IRON", "RETICCTPCT" in the last 72 hours. Sepsis Labs: No results for input(s): "PROCALCITON", "LATICACIDVEN" in the last 168 hours.  Recent Results (from the past 240 hours)  Culture, blood (Routine X 2) w Reflex to ID Panel     Status: None (Preliminary result)   Collection Time: 09/16/23  3:24 PM   Specimen: BLOOD  Result Value Ref Range Status   Specimen Description BLOOD BLOOD RIGHT HAND  Final   Special Requests    Final    BOTTLES DRAWN AEROBIC AND ANAEROBIC Blood Culture results may not be optimal due to an inadequate volume of blood received in culture bottles   Culture   Final    NO GROWTH 4 DAYS Performed at [facility], [address], [place], [place] [address]    Report Status PENDING  Incomplete  Culture, blood (Routine X 2) w Reflex to ID Panel     Status: None (Preliminary result)   Collection Time: 09/16/23  3:24 PM   Specimen: BLOOD  Result Value Ref Range Status   Specimen Description BLOOD LEFT ANTECUBITAL  Final   Special Requests   Final    BOTTLES DRAWN AEROBIC AND ANAEROBIC Blood Culture results may not be optimal due to an inadequate volume of blood received in culture bottles   Culture   Final    NO GROWTH 4 DAYS Performed at [facility], [address] [place], [place] [address]    Report Status PENDING  Incomplete  Urine Culture     Status: Abnormal   Collection Time: 09/16/23  3:24 PM   Specimen: Urine, Random  Result Value Ref Range Status   Specimen Description   Final    URINE, RANDOM Performed at [facility], [address] [place], [place] [address]    Special Requests   Final    NONE Reflexed from [id number] Performed at [facility], [address] [place], [place] [address]    Culture >=100,000 COLONIES/mL PROTEUS MIRABILIS (A)  Final   Report Status 09/18/2023 FINAL  Final   Organism ID, Bacteria PROTEUS MIRABILIS (A)  Final      Susceptibility   Proteus mirabilis - MIC*    AMPICILLIN <=2 SENSITIVE Sensitive     CEFAZOLIN <=4 SENSITIVE Sensitive     CEFEPIME <=0.12 SENSITIVE Sensitive     CEFTRIAXONE <=0.25 SENSITIVE Sensitive     CIPROFLOXACIN <=0.25 SENSITIVE Sensitive     GENTAMICIN <=1 SENSITIVE Sensitive     IMIPENEM 2 SENSITIVE Sensitive     NITROFURANTOIN 128 RESISTANT Resistant     TRIMETH/SULFA <=20 SENSITIVE Sensitive     AMPICILLIN/SULBACTAM <=2 SENSITIVE Sensitive     PIP/TAZO <=4 SENSITIVE  Sensitive ug/mL    * >=100,000 COLONIES/mL PROTEUS MIRABILIS  Aerobic Culture w Gram Stain (superficial specimen)     Status: None (Preliminary result)   Collection Time: 09/18/23  4:00 PM   Specimen: Wound  Result Value Ref Range Status   Specimen Description   Final    WOUND Performed at [facility], [address], [place], [place] [address]    Special Requests   Final    NONE Performed at [facility], [address] [place], [place] [address]    Gram Stain   Final    RARE WBC PRESENT, PREDOMINANTLY PMN RARE GRAM POSITIVE COCCI    Culture   Final    CULTURE REINCUBATED FOR BETTER GROWTH Performed at [facility], [address] [place], [place] [address]    Report Status PENDING  Incomplete  MRSA Next Gen by PCR, Nasal     Status: Abnormal   Collection Time: 09/18/23  4:20 PM   Specimen: Nasal Mucosa; Nasal Swab  Result Value Ref Range Status   MRSA by PCR Next Gen DETECTED (A) NOT DETECTED Final    Comment: RESULT CALLED TO, READ BACK BY AND VERIFIED WITH: Melissa Dunlap @1834  on 09/18/23 SKL (NOTE) The GeneXpert MRSA Assay (FDA approved for NASAL specimens only), is one component of a comprehensive MRSA colonization surveillance program. It is not intended to diagnose MRSA infection nor to guide or monitor treatment for MRSA infections. Test performance is not FDA approved in patients less than 11 years old. Performed at [facility], [address], [place], [place] [address]          Radiology Studies: No results found.       Scheduled Meds:  Chlorhexidine Gluconate Cloth  6 each Topical Daily   cyanocobalamin  1,000 mcg Oral Daily   insulin aspart  0-5 Units Subcutaneous QHS   insulin aspart  0-9 Units Subcutaneous TID WC   levofloxacin  750 mg Oral Q48H   memantine  10 mg Oral BID   Oxcarbazepine  300 mg Oral BID   rosuvastatin  20 mg Oral QHS   sodium bicarbonate  650 mg Oral BID  sodium zirconium  cyclosilicate  10 g Oral Once   Continuous Infusions:     LOS: 4 days       Charise Killian, MD [facility] Pager [phone]  If 7PM-7AM, please contact night-coverage [email or web address] 09/20/2023, 8:45 AM

## 2023-09-20 NOTE — Plan of Care (Signed)

## 2023-09-20 NOTE — Progress Notes (Signed)
 Lokelma directions state to give two hours before or after other medications. Nurse moved dose to 1130, MD requested nurse give medication now. Nurse will administer as requested.

## 2023-09-20 NOTE — Plan of Care (Signed)
  Problem: Clinical Measurements: Goal: Diagnostic test results will improve Outcome: Progressing   Problem: Nutrition: Goal: Adequate nutrition will be maintained Outcome: Progressing   Problem: Coping: Goal: Level of anxiety will decrease Outcome: Progressing   Problem: Safety: Goal: Ability to remain free from injury will improve Outcome: Progressing   Problem: Metabolic: Goal: Ability to maintain appropriate glucose levels will improve Outcome: Progressing   Problem: Nutritional: Goal: Maintenance of adequate nutrition will improve Outcome: Progressing

## 2023-09-21 ENCOUNTER — Emergency Department
Admission: EM | Admit: 2023-09-21 | Discharge: 2023-09-22 | Disposition: A | Discharge status: Home / Self Care | Attending: Emergency Medicine | Admitting: Emergency Medicine

## 2023-09-21 ENCOUNTER — Other Ambulatory Visit: Payer: Self-pay

## 2023-09-21 DIAGNOSIS — R531 Weakness: Secondary | ICD-10-CM | POA: Diagnosis present

## 2023-09-21 DIAGNOSIS — R2689 Other abnormalities of gait and mobility: Secondary | ICD-10-CM | POA: Insufficient documentation

## 2023-09-21 DIAGNOSIS — R262 Difficulty in walking, not elsewhere classified: Secondary | ICD-10-CM | POA: Insufficient documentation

## 2023-09-21 DIAGNOSIS — N189 Chronic kidney disease, unspecified: Secondary | ICD-10-CM | POA: Insufficient documentation

## 2023-09-21 DIAGNOSIS — Z7901 Long term (current) use of anticoagulants: Secondary | ICD-10-CM | POA: Diagnosis not present

## 2023-09-21 DIAGNOSIS — E875 Hyperkalemia: Secondary | ICD-10-CM | POA: Diagnosis not present

## 2023-09-21 LAB — CBC WITH DIFFERENTIAL/PLATELET
Abs Immature Granulocytes: 0.03 10*3/uL (ref 0.00–0.07)
Basophils Absolute: 0 10*3/uL (ref 0.0–0.1)
Basophils Relative: 1 %
Eosinophils Absolute: 0.4 10*3/uL (ref 0.0–0.5)
Eosinophils Relative: 6 %
HCT: 29.8 % — ABNORMAL LOW (ref 39.0–52.0)
Hemoglobin: 9.3 g/dL — ABNORMAL LOW (ref 13.0–17.0)
Immature Granulocytes: 1 %
Lymphocytes Relative: 19 %
Lymphs Abs: 1.1 10*3/uL (ref 0.7–4.0)
MCH: 27.7 pg (ref 26.0–34.0)
MCHC: 31.2 g/dL (ref 30.0–36.0)
MCV: 88.7 fL (ref 80.0–100.0)
Monocytes Absolute: 0.3 10*3/uL (ref 0.1–1.0)
Monocytes Relative: 5 %
Neutro Abs: 4.1 10*3/uL (ref 1.7–7.7)
Neutrophils Relative %: 68 %
Platelets: 187 10*3/uL (ref 150–400)
RBC: 3.36 MIL/uL — ABNORMAL LOW (ref 4.22–5.81)
RDW: 17.2 % — ABNORMAL HIGH (ref 11.5–15.5)
WBC: 6 10*3/uL (ref 4.0–10.5)
nRBC: 0 % (ref 0.0–0.2)

## 2023-09-21 LAB — CBC
HCT: 27.7 % — ABNORMAL LOW (ref 39.0–52.0)
Hemoglobin: 8.6 g/dL — ABNORMAL LOW (ref 13.0–17.0)
MCH: 27.7 pg (ref 26.0–34.0)
MCHC: 31 g/dL (ref 30.0–36.0)
MCV: 89.1 fL (ref 80.0–100.0)
Platelets: 185 10*3/uL (ref 150–400)
RBC: 3.11 MIL/uL — ABNORMAL LOW (ref 4.22–5.81)
RDW: 17.1 % — ABNORMAL HIGH (ref 11.5–15.5)
WBC: 5.4 10*3/uL (ref 4.0–10.5)
nRBC: 0 % (ref 0.0–0.2)

## 2023-09-21 LAB — BASIC METABOLIC PANEL
Anion gap: 4 — ABNORMAL LOW (ref 5–15)
Anion gap: 6 (ref 5–15)
BUN: 42 mg/dL — ABNORMAL HIGH (ref 8–23)
BUN: 41 mg/dL — ABNORMAL HIGH (ref 8–23)
CO2: 18 mmol/L — ABNORMAL LOW (ref 22–32)
CO2: 20 mmol/L — ABNORMAL LOW (ref 22–32)
Calcium: 8.1 mg/dL — ABNORMAL LOW (ref 8.9–10.3)
Calcium: 8.9 mg/dL (ref 8.9–10.3)
Chloride: 112 mmol/L — ABNORMAL HIGH (ref 98–111)
Chloride: 112 mmol/L — ABNORMAL HIGH (ref 98–111)
Creatinine, Ser: 2.3 mg/dL — ABNORMAL HIGH (ref 0.61–1.24)
Creatinine, Ser: 2.32 mg/dL — ABNORMAL HIGH (ref 0.61–1.24)
GFR, Estimated: 30 mL/min — ABNORMAL LOW (ref >60)
GFR, Estimated: 30 mL/min — ABNORMAL LOW (ref >60)
Glucose, Bld: 131 mg/dL — ABNORMAL HIGH (ref 70–99)
Glucose, Bld: 139 mg/dL — ABNORMAL HIGH (ref 70–99)
Potassium: 4.7 mmol/L (ref 3.5–5.1)
Potassium: 4.7 mmol/L (ref 3.5–5.1)
Sodium: 134 mmol/L — ABNORMAL LOW (ref 135–145)
Sodium: 138 mmol/L (ref 135–145)

## 2023-09-21 LAB — CULTURE, BLOOD (ROUTINE X 2)
Culture: NO GROWTH
Culture: NO GROWTH

## 2023-09-21 MED ORDER — AMLODIPINE BESYLATE 5 MG PO TABS
5.0000 mg | ORAL_TABLET | Freq: Every day | ORAL | Status: DC
  Administered 2023-09-22: 5 mg via ORAL
  Filled 2023-09-21: qty 1

## 2023-09-21 MED ORDER — OXCARBAZEPINE 300 MG PO TABS
300.0000 mg | ORAL_TABLET | Freq: Two times a day (BID) | ORAL | Status: DC
  Administered 2023-09-21 – 2023-09-22 (×2): 300 mg via ORAL
  Filled 2023-09-21 (×2): qty 1

## 2023-09-21 MED ORDER — ROSUVASTATIN CALCIUM 20 MG PO TABS
20.0000 mg | ORAL_TABLET | Freq: Every day | ORAL | Status: DC
  Filled 2023-09-21: qty 1

## 2023-09-21 MED ORDER — LEVOFLOXACIN 500 MG PO TABS
750.0000 mg | ORAL_TABLET | ORAL | Status: DC
  Administered 2023-09-22: 750 mg via ORAL
  Filled 2023-09-21: qty 2

## 2023-09-21 MED ORDER — IRBESARTAN 150 MG PO TABS
150.0000 mg | ORAL_TABLET | Freq: Every day | ORAL | Status: DC
  Administered 2023-09-22: 150 mg via ORAL
  Filled 2023-09-21: qty 1

## 2023-09-21 MED ORDER — APIXABAN 5 MG PO TABS
5.0000 mg | ORAL_TABLET | Freq: Two times a day (BID) | ORAL | Status: DC
  Administered 2023-09-21 – 2023-09-22 (×2): 5 mg via ORAL
  Filled 2023-09-21 (×2): qty 1

## 2023-09-21 MED ORDER — CYANOCOBALAMIN 500 MCG PO TABS
1000.0000 ug | ORAL_TABLET | Freq: Every day | ORAL | Status: DC
  Administered 2023-09-22: 1000 ug via ORAL
  Filled 2023-09-21: qty 2

## 2023-09-21 MED ORDER — LEVOFLOXACIN 750 MG PO TABS: 750.0000 mg | ORAL_TABLET | ORAL | 0 refills | Status: AC

## 2023-09-21 MED ORDER — AMLODIPINE-OLMESARTAN 5-20 MG PO TABS: 1.0000 | ORAL_TABLET | Freq: Every day | ORAL | Status: DC

## 2023-09-21 MED ORDER — MEMANTINE HCL 5 MG PO TABS
10.0000 mg | ORAL_TABLET | Freq: Two times a day (BID) | ORAL | Status: DC
Start: 2023-09-21 — End: 2023-09-22
  Administered 2023-09-21 – 2023-09-22 (×2): 10 mg via ORAL
  Filled 2023-09-21 (×2): qty 2

## 2023-09-21 MED ORDER — GLIPIZIDE ER 2.5 MG PO TB24
2.5000 mg | ORAL_TABLET | Freq: Every day | ORAL | Status: DC
  Administered 2023-09-22: 2.5 mg via ORAL
  Filled 2023-09-21: qty 1

## 2023-09-21 NOTE — ED Notes (Signed)
 Emptied pt's colostomy bag at this time. Also emptied pt's urinary catheter bag. Dinner at bedside states he is not ready to eat at this time. Instructed to press call bell when ready. Pt verbalized understanding at this time and denies any other needs.

## 2023-09-21 NOTE — Discharge Summary (Signed)
 Physician Discharge Summary  [name] QVZ:[id number] DOB: 1956/05/04 DOA: 09/16/2023  PCP: Dione Booze, MD  Admit date: 09/16/2023 Discharge date: 09/21/2023  Admitted From: home  Disposition:  home   Recommendations for Outpatient Follow-up:  Follow up with PCP in 1-2 weeks   Home Health:  Equipment/Devices: chronic catheter  Discharge Condition: stable  CODE STATUS: full  Diet recommendation: Heart Healthy  Brief/Interim Summary: HPI was taken from Dr. Sedalia Muta: [name] is a 68 year old male with history of morbid obesity, bipolar, CKD 3B, history of DVT on Eliquis, neurogenic bladder requiring catheter,, who presents emergency department for chief concerns of sacral/buttock wound bleeding.   Vitals in the ED showed temperature 97.8, respiration rate of 16, heart rate 81, blood pressure 100/75, SpO2 100% on room air.   Serum sodium is 134, potassium 5.5, chloride 111, bicarb 14, BUN 49, serum creatinine 2.85, EGFR 23, nonfasting blood glucose 131, WBC 7.7, hemoglobin 8.0, platelets of 215.   Sed rate is greater than 140.  Initial high high-sensitivity troponin was 8.   ED treatment: Lokelma 10 mg p.o. one-time dose, TXA 500 mg TP, vancomycin, Zosyn. ----------------------------------- At bedside, patient is able to tell me his first and last name, age, location, current calendar year.   He reports he was sent to the hospital by wound clinic due to bleeding from his sacral area.  He reports he had a bone biopsy yesterday outpatient due to concerns of osteomyelitis.   He reports that he continued on Eliquis before and after bone biopsy without being told to stop Eliquis.  He reports his last dose of Eliquis was this morning prior to ED presentation.   He denies fever, chills, chest pain, shortness of breath and/or changes to urinary output from the Foley bag.  He denies changes to colostomy output.  He reports he has had a colostomy bag for the last  2 to 3 years.   At bedside, his strength is 1/5 of the bilateral lower extremities. He clearly makes attempt to flex his knee and the result is not a full ROM. He reports this strength level is more than his baseline.    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    Sacral osteomyelitis: w/ acute on chronic bleed. S/p TXA. Sedentary lifestyle. Blood cxs NGTD. No debridement needed as per gen surg. Continue on levaquin x 2 weeks as per ID    UTI: urine cx growing proteus. Continue on levaquin   Metabolic acidosis: labile. Continue on bicarb   HTN: restart home dose of amlodipine, olmeasartan    AKI on CKDIIIb:  Cr is labile. Avoid nephrotoxic meds    Hx of DVT:  holding home dose of eliquis secondary to  sacral wound bleeding requiring TXA. Restart home dose of eliquis   DM2: well controlled, HbA1c 5.9 in 07/2023. Continue w/ SSI w/ accuchecks   Colostomy: continue w/ supportive care    Major neurocognitive disorder: secondary to 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 lobar degeneration. Continue on home dose of seroquel    Chronic diastolic CHF: appears euvolemic. Monitor I/Os. Restart olmesartan   Morbid  obesity: BMI 45.0. Complicates overall care & prognosis    Bipolar disorder: continue on home dose of oxcarbazepine    GAD: severity unknown. Ativan prn    Normocytic anemia: s/p 1 unit of pRBCs transfused. H&H are trending up   Discharge Instructions  Discharge Instructions     Diet - low sodium heart healthy   Complete by: As directed    Diet Carb Modified   Complete by: As directed    Discharge instructions   Complete by: As directed    F/u w/ PCP in 1-2 weeks   Discharge wound care:   Complete by: As directed    Cleanse R heel with Vashe wound cleanser Hart [place] [phone]), apply silver hydrofiber (Aquacel Cor[place]e [place] [phone]) cut to fit wound bed daily.  Cover with gauze and Kerlix or silicone foam. Place B heels in Prevalon boots to offload pressure.  Daily: Cleanse sacral wound with Vashe wound cleanser(Lawson #[id number]), apply Silver hydrofiber Hart [place] [phone]) to wound bed making sure to cover all areas of undermining and fill entire wound.  Cover with dry gauze and silicone foam.   Increase activity slowly   Complete by: As directed       Allergies as of 09/21/2023   No Known Allergies      Medication List     TAKE these medications    amLODipine-olmesartan 5-20 MG tablet Commonly known as: AZOR Take 1 tablet by mouth daily.   cyanocobalamin 1000 MCG tablet Commonly known as: VITAMIN B12 Take 1,000 mcg by mouth daily.   Dexcom G7 Receiver Devi UAD for blood sugar monitoring.   Eliquis 5 MG Tabs tablet Generic drug: apixaban Take 5 mg by mouth 2 (two) times daily.   glipiZIDE 2.5 MG 24 hr tablet Commonly known as: GLUCOTROL XL Take 2.5 mg by mouth daily.   levofloxacin 750 MG tablet Commonly known as: LEVAQUIN Take 1 tablet (750 mg total) by mouth every other day for 12 days. Start taking on: September 22, 2023   memantine 10 MG tablet Commonly known as: NAMENDA Take 10 mg by mouth 2 (two) times daily.   Oxcarbazepine 300 MG tablet Commonly known as:  TRILEPTAL Take 1 tablet by mouth twice daily   QUEtiapine 25 MG tablet Commonly known as: SEROQUEL TAKE 1 TO 2 TABLETS BY MOUTH ONCE DAILY AS NEEDED What changed: See the new instructions.   rosuvastatin 20 MG tablet Commonly known as: CRESTOR Take 20 mg by mouth at bedtime.               Discharge Care Instructions  (From admission, onward)           Start     Ordered   09/21/23 0000  Discharge wound care:       Comments: Cleanse R heel with Vashe wound cleanser Hart [place] [phone]), apply silver hydrofiber (Aquacel [place] [phone]) cut to fit wound bed daily.  Cover with gauze and Kerlix or silicone foam. Place B heels in Prevalon boots to offload pressure.  Daily: Cleanse sacral wound with Vashe wound cleanser(Lawson #[id number]), apply Silver hydrofiber Hart [place] [phone]) to wound bed making sure  to cover all areas of undermining and fill entire wound.  Cover with dry gauze and silicone foam.   09/21/23 1152            No Known Allergies  Consultations: Gen surg ID   Procedures/Studies: CT ABDOMEN PELVIS WO CONTRAST Result Date: 09/16/2023 CLINICAL DATA:  Sacral wound. EXAM: CT ABDOMEN AND PELVIS WITHOUT CONTRAST TECHNIQUE: Multidetector CT imaging of the abdomen and pelvis was performed following the standard protocol without IV contrast. RADIATION DOSE REDUCTION: This exam was performed according to the departmental dose-optimization program which includes automated exposure control, adjustment of the mA and/or kV according to patient size and/or use of iterative reconstruction technique. COMPARISON:  February 27, 2023. FINDINGS: Lower chest: No acute abnormality. Hepatobiliary: No focal liver abnormality is seen. No gallstones, gallbladder wall thickening, or biliary dilatation. Pancreas: Unremarkable. No pancreatic ductal dilatation or surrounding inflammatory changes. Spleen: Normal in size without focal abnormality. Adrenals/Urinary Tract: Adrenal glands are  unremarkable. Kidneys are normal, without renal calculi, focal lesion, or hydronephrosis. Bladder is unremarkable. Stomach/Bowel: Stomach is unremarkable. Loop colostomy is noted in left lower quadrant. There is no evidence of bowel obstruction or inflammation. The appendix appears normal. Vascular/Lymphatic: Aortic atherosclerosis. No enlarged abdominal or pelvic lymph nodes. Reproductive: Mildly enlarged prostate gland is noted. Foley catheter balloon has been inflated within the gland. Repositioning is recommended. Other: No ascites is noted. 5.4 x 3.3 cm ulceration is seen extending from inferior buttocks region 2 inferior portion of coccyx. Contains debris consistent with abscess. There appears to be some degree of lytic destruction involving the coccyx suggesting osteomyelitis. Musculoskeletal: As noted above, there appears to be some degree of lytic destruction involving the coccyx suggesting osteomyelitis. IMPRESSION: Mildly enlarged prostate gland is noted. Foley catheter balloon is inflated within the gland. Repositioning is recommended. 5.4 x 3.3 cm soft tissue ulceration or wound is seen extending from inferior buttocks regions toward inferior portion of coccyx. It contains debris consistent with abscess. There does appear to be some degree of lytic destruction involving the adjacent coccyx suggesting osteomyelitis. Aortic Atherosclerosis (ICD10-I70.0). Electronically Signed   By: Lupita Raider M.D.   On: 09/16/2023 15:32   DG Sacrum/Coccyx Result Date: 09/16/2023 CLINICAL DATA:  Sacral wound.  Bone infection. EXAM: SACRUM AND COCCYX - 2+ VIEW COMPARISON:  Pelvic radiograph dated 12/16/2021. FINDINGS: No acute fracture or dislocation. The bones are osteopenic. Severe arthritic changes of the hips. The soft tissues are grossly unremarkable. Atherosclerotic calcification of the aorta. IMPRESSION: 1. No acute fracture or dislocation. 2. Severe arthritic changes of the hips. Electronically Signed   By:  Elgie Collard M.D.   On: 09/16/2023 12:03   (Echo, Carotid, EGD, Colonoscopy, ERCP)    Subjective: Pt denies any complaints   Discharge Exam: Vitals:   09/21/23 0438 09/21/23 0750  BP: (!) 155/75 (!) 153/88  Pulse: (!) 57 60  Resp:  16  Temp:  97.8 F (36.6 C)  SpO2: 100% 100%   Vitals:   09/20/23 2030 09/21/23 0435 09/21/23 0438 09/21/23 0750  BP: (!) 148/72 (!) 169/96 (!) 155/75 (!) 153/88  Pulse: 64 60 (!) 57 60  Resp:    16  Temp: 98.4 F (36.9 C) 98.5 F (36.9 C)  97.8 F (36.6 C)  TempSrc: Oral Oral    SpO2: 100% 100% 100% 100%  Weight:      Height:        General: Pt is alert, awake, not in acute distress Cardiovascular: S1/S2 +, no rubs, no  gallops Respiratory: CTA bilaterally, no wheezing, no rhonchi Abdominal: Soft, NT, obese, bowel sounds + Extremities:  no cyanosis    The results of significant diagnostics from this hospitalization (including imaging, microbiology, ancillary and laboratory) are listed below for reference.     Microbiology: Recent Results (from the past 240 hours)  Culture, blood (Routine X 2) w Reflex to ID Panel     Status: None   Collection Time: 09/16/23  3:24 PM   Specimen: BLOOD  Result Value Ref Range Status   Specimen Description BLOOD BLOOD RIGHT HAND  Final   Special Requests   Final    BOTTLES DRAWN AEROBIC AND ANAEROBIC Blood Culture results may not be optimal due to an inadequate volume of blood received in culture bottles   Culture   Final    NO GROWTH 5 DAYS Performed at [facility], [address] [place], [place] [address]    Report Status 09/21/2023 FINAL  Final  Culture, blood (Routine X 2) w Reflex to ID Panel     Status: None   Collection Time: 09/16/23  3:24 PM   Specimen: BLOOD  Result Value Ref Range Status   Specimen Description BLOOD LEFT ANTECUBITAL  Final   Special Requests   Final    BOTTLES DRAWN AEROBIC AND ANAEROBIC Blood Culture results may not be optimal due to an inadequate  volume of blood received in culture bottles   Culture   Final    NO GROWTH 5 DAYS Performed at [facility], [address] [place], [place] [address]    Report Status 09/21/2023 FINAL  Final  Urine Culture     Status: Abnormal   Collection Time: 09/16/23  3:24 PM   Specimen: Urine, Random  Result Value Ref Range Status   Specimen Description   Final    URINE, RANDOM Performed at [facility], [address], [place], [place] [address]    Special Requests   Final    NONE Reflexed from [id number] Performed at [facility], [address] [place], [place] [address]    Culture >=100,000 COLONIES/mL PROTEUS MIRABILIS (A)  Final   Report Status 09/18/2023 FINAL  Final   Organism ID, Bacteria PROTEUS MIRABILIS (A)  Final      Susceptibility   Proteus mirabilis - MIC*    AMPICILLIN <=2 SENSITIVE Sensitive     CEFAZOLIN <=4 SENSITIVE Sensitive     CEFEPIME <=0.12 SENSITIVE Sensitive     CEFTRIAXONE <=0.25 SENSITIVE Sensitive     CIPROFLOXACIN <=0.25 SENSITIVE Sensitive     GENTAMICIN <=1 SENSITIVE Sensitive     IMIPENEM 2 SENSITIVE Sensitive     NITROFURANTOIN 128 RESISTANT Resistant     TRIMETH/SULFA <=20 SENSITIVE Sensitive     AMPICILLIN/SULBACTAM <=2 SENSITIVE Sensitive     PIP/TAZO <=4 SENSITIVE Sensitive ug/mL    * >=100,000 COLONIES/mL PROTEUS MIRABILIS  Aerobic Culture w Gram Stain (superficial specimen)     Status: None (Preliminary result)   Collection Time: 09/18/23  4:00 PM   Specimen: Wound  Result Value Ref Range Status   Specimen Description   Final    WOUND Performed at [facility], [address] [place] [address], [place], [place] [address]    Special Requests   Final    NONE Performed at [facility], [address] [place], [place] [address]    Gram Stain   Final    RARE WBC PRESENT, PREDOMINANTLY PMN RARE GRAM POSITIVE COCCI    Culture  Final    MODERATE STAPHYLOCOCCUS AUREUS FEW ACINETOBACTER CALCOACETICUS/BAUMANNII  COMPLEX FEW PSEUDOMONAS AERUGINOSA FEW ENTEROCOCCUS FAECALIS FEW STENOTROPHOMONAS MALTOPHILIA SUSCEPTIBILITIES TO FOLLOW Performed at [facility], [address], [place], [place] [address]    Report Status PENDING  Incomplete   Organism ID, Bacteria STAPHYLOCOCCUS AUREUS  Final   Organism ID, Bacteria ACINETOBACTER CALCOACETICUS/BAUMANNII COMPLEX  Final      Susceptibility   Acinetobacter calcoaceticus/baumannii complex - MIC*    CEFTAZIDIME 4 SENSITIVE Sensitive     CIPROFLOXACIN >=4 RESISTANT Resistant     GENTAMICIN 4 SENSITIVE Sensitive     IMIPENEM <=0.25 SENSITIVE Sensitive     PIP/TAZO <=4 SENSITIVE Sensitive ug/mL    TRIMETH/SULFA <=20 SENSITIVE Sensitive     AMPICILLIN/SULBACTAM <=2 SENSITIVE Sensitive     * FEW ACINETOBACTER CALCOACETICUS/BAUMANNII COMPLEX   Staphylococcus aureus - MIC*    CIPROFLOXACIN >=8 RESISTANT Resistant     ERYTHROMYCIN >=8 RESISTANT Resistant     GENTAMICIN <=0.5 SENSITIVE Sensitive     OXACILLIN >=4 RESISTANT Resistant     TETRACYCLINE <=1 SENSITIVE Sensitive     VANCOMYCIN 2 SENSITIVE Sensitive     TRIMETH/SULFA >=320 RESISTANT Resistant     CLINDAMYCIN <=0.25 SENSITIVE Sensitive     RIFAMPIN <=0.5 SENSITIVE Sensitive     Inducible Clindamycin NEGATIVE Sensitive     * MODERATE STAPHYLOCOCCUS AUREUS  MRSA Next Gen by PCR, Nasal     Status: Abnormal   Collection Time: 09/18/23  4:20 PM   Specimen: Nasal Mucosa; Nasal Swab  Result Value Ref Range Status   MRSA by PCR Next Gen DETECTED (A) NOT DETECTED Final    Comment: RESULT CALLED TO, READ BACK BY AND VERIFIED WITH: Melissa Dunlap @1834  on 09/18/23 SKL (NOTE) The GeneXpert MRSA Assay (FDA approved for NASAL specimens only), is one component of a comprehensive MRSA colonization surveillance program. It is not intended to diagnose MRSA infection nor to guide or monitor treatment for MRSA infections. Test performance is not FDA approved in patients less than 97 years old. Performed  at [facility], [address] [place], [place] [address]      Labs: BNP (last 3 results) No results for input(s): "BNP" in the last 8760 hours. Basic Metabolic Panel: Recent Labs  Lab 09/17/23 0159 09/18/23 0510 09/19/23 0403 09/20/23 0539 09/20/23 1300 09/21/23 0244  NA 132* 138 137 137  --  134*  K 5.1 5.0 5.0 5.3* 5.2* 4.7  CL 109 113* 114* 114*  --  112*  CO2 17* 19* 18* 18*  --  18*  GLUCOSE 149* 115* 123* 129*  --  131*  BUN 45* 42* 37* 35*  --  42*  CREATININE 2.58* 2.33* 2.09* 2.13*  --  2.30*  CALCIUM 8.2* 8.3* 8.2* 8.5*  --  8.1*   Liver Function Tests: Recent Labs  Lab 09/16/23 1123  AST 22  ALT 19  ALKPHOS 69  BILITOT 0.6  PROT 7.9  ALBUMIN 3.0*   No results for input(s): "LIPASE", "AMYLASE" in the last 168 hours. No results for input(s): "AMMONIA" in the last 168 hours. CBC: Recent Labs  Lab 09/17/23 0159 09/18/23 0510 09/19/23 0403 09/19/23 2023 09/20/23 0539 09/21/23 0244  WBC 5.4 4.3 4.0  --  4.7 5.4  HGB 7.5* 7.1* 6.9* 8.5* 8.3* 8.6*  HCT 24.1* 23.4* 22.1* 27.0* 25.7* 27.7*  MCV 87.0 88.3 85.7  --  86.5 89.1  PLT 210 200 201  --  210 185   Cardiac Enzymes: No  results for input(s): "CKTOTAL", "CKMB", "CKMBINDEX", "TROPONINI" in the last 168 hours. BNP: Invalid input(s): "POCBNP" CBG: Recent Labs  Lab 09/18/23 1135 09/18/23 1701 09/18/23 2120 09/19/23 0749 09/19/23 1146  GLUCAP 138* 111* 205* 118* 135*   D-Dimer No results for input(s): "DDIMER" in the last 72 hours. Hgb A1c No results for input(s): "HGBA1C" in the last 72 hours. Lipid Profile No results for input(s): "CHOL", "HDL", "LDLCALC", "TRIG", "CHOLHDL", "LDLDIRECT" in the last 72 hours. Thyroid function studies No results for input(s): "TSH", "T4TOTAL", "T3FREE", "THYROIDAB" in the last 72 hours.  Invalid input(s): "FREET3" Anemia work up No results for input(s): "VITAMINB12", "FOLATE", "FERRITIN", "TIBC", "IRON", "RETICCTPCT" in the last 72  hours. Urinalysis    Component Value Date/Time   COLORURINE AMBER (A) 09/16/2023 1524   APPEARANCEUR CLOUDY (A) 09/16/2023 1524   APPEARANCEUR Cloudy (A) 07/18/2023 1211   LABSPEC 1.015 09/16/2023 1524   PHURINE 8.0 09/16/2023 1524   GLUCOSEU NEGATIVE 09/16/2023 1524   HGBUR SMALL (A) 09/16/2023 1524   BILIRUBINUR NEGATIVE 09/16/2023 1524   BILIRUBINUR Negative 07/18/2023 1211   KETONESUR NEGATIVE 09/16/2023 1524   PROTEINUR >=300 (A) 09/16/2023 1524   NITRITE NEGATIVE 09/16/2023 1524   LEUKOCYTESUR LARGE (A) 09/16/2023 1524   Sepsis Labs Recent Labs  Lab 09/18/23 0510 09/19/23 0403 09/20/23 0539 09/21/23 0244  WBC 4.3 4.0 4.7 5.4   Microbiology Recent Results (from the past 240 hours)  Culture, blood (Routine X 2) w Reflex to ID Panel     Status: None   Collection Time: 09/16/23  3:24 PM   Specimen: BLOOD  Result Value Ref Range Status   Specimen Description BLOOD BLOOD RIGHT HAND  Final   Special Requests   Final    BOTTLES DRAWN AEROBIC AND ANAEROBIC Blood Culture results may not be optimal due to an inadequate volume of blood received in culture bottles   Culture   Final    NO GROWTH 5 DAYS Performed at [facility], [address] [place], [place] [address]    Report Status 09/21/2023 FINAL  Final  Culture, blood (Routine X 2) w Reflex to ID Panel     Status: None   Collection Time: 09/16/23  3:24 PM   Specimen: BLOOD  Result Value Ref Range Status   Specimen Description BLOOD LEFT ANTECUBITAL  Final   Special Requests   Final    BOTTLES DRAWN AEROBIC AND ANAEROBIC Blood Culture results may not be optimal due to an inadequate volume of blood received in culture bottles   Culture   Final    NO GROWTH 5 DAYS Performed at [facility], [address], [place], [place] [address]    Report Status 09/21/2023 FINAL  Final  Urine Culture     Status: Abnormal   Collection Time: 09/16/23  3:24 PM   Specimen: Urine, Random  Result Value Ref Range  Status   Specimen Description   Final    URINE, RANDOM Performed at [facility], [address], [place], [place] [address]    Special Requests   Final    NONE Reflexed from [id number] Performed at [facility], [address] [place], [place] [address]    Culture >=100,000 COLONIES/mL PROTEUS MIRABILIS (A)  Final   Report Status 09/18/2023 FINAL  Final   Organism ID, Bacteria PROTEUS MIRABILIS (A)  Final      Susceptibility   Proteus mirabilis - MIC*    AMPICILLIN <=2 SENSITIVE Sensitive     CEFAZOLIN <=4 SENSITIVE  Sensitive     CEFEPIME <=0.12 SENSITIVE Sensitive     CEFTRIAXONE <=0.25 SENSITIVE Sensitive     CIPROFLOXACIN <=0.25 SENSITIVE Sensitive     GENTAMICIN <=1 SENSITIVE Sensitive     IMIPENEM 2 SENSITIVE Sensitive     NITROFURANTOIN 128 RESISTANT Resistant     TRIMETH/SULFA <=20 SENSITIVE Sensitive     AMPICILLIN/SULBACTAM <=2 SENSITIVE Sensitive     PIP/TAZO <=4 SENSITIVE Sensitive ug/mL    * >=100,000 COLONIES/mL PROTEUS MIRABILIS  Aerobic Culture w Gram Stain (superficial specimen)     Status: None (Preliminary result)   Collection Time: 09/18/23  4:00 PM   Specimen: Wound  Result Value Ref Range Status   Specimen Description   Final    WOUND Performed at [facility], [address], [place], [place] [address]    Special Requests   Final    NONE Performed at [facility], [address] [place], [place] [address]    Gram Stain   Final    RARE WBC PRESENT, PREDOMINANTLY PMN RARE GRAM POSITIVE COCCI    Culture   Final    MODERATE STAPHYLOCOCCUS AUREUS FEW ACINETOBACTER CALCOACETICUS/BAUMANNII COMPLEX FEW PSEUDOMONAS AERUGINOSA FEW ENTEROCOCCUS FAECALIS FEW STENOTROPHOMONAS MALTOPHILIA SUSCEPTIBILITIES TO FOLLOW Performed at [facility], [address], [place], [place] [address]    Report Status PENDING  Incomplete   Organism ID, Bacteria STAPHYLOCOCCUS AUREUS  Final   Organism ID, Bacteria ACINETOBACTER  CALCOACETICUS/BAUMANNII COMPLEX  Final      Susceptibility   Acinetobacter calcoaceticus/baumannii complex - MIC*    CEFTAZIDIME 4 SENSITIVE Sensitive     CIPROFLOXACIN >=4 RESISTANT Resistant     GENTAMICIN 4 SENSITIVE Sensitive     IMIPENEM <=0.25 SENSITIVE Sensitive     PIP/TAZO <=4 SENSITIVE Sensitive ug/mL    TRIMETH/SULFA <=20 SENSITIVE Sensitive     AMPICILLIN/SULBACTAM <=2 SENSITIVE Sensitive     * FEW ACINETOBACTER CALCOACETICUS/BAUMANNII COMPLEX   Staphylococcus aureus - MIC*    CIPROFLOXACIN >=8 RESISTANT Resistant     ERYTHROMYCIN >=8 RESISTANT Resistant     GENTAMICIN <=0.5 SENSITIVE Sensitive     OXACILLIN >=4 RESISTANT Resistant     TETRACYCLINE <=1 SENSITIVE Sensitive     VANCOMYCIN 2 SENSITIVE Sensitive     TRIMETH/SULFA >=320 RESISTANT Resistant     CLINDAMYCIN <=0.25 SENSITIVE Sensitive     RIFAMPIN <=0.5 SENSITIVE Sensitive     Inducible Clindamycin NEGATIVE Sensitive     * MODERATE STAPHYLOCOCCUS AUREUS  MRSA Next Gen by PCR, Nasal     Status: Abnormal   Collection Time: 09/18/23  4:20 PM   Specimen: Nasal Mucosa; Nasal Swab  Result Value Ref Range Status   MRSA by PCR Next Gen DETECTED (A) NOT DETECTED Final    Comment: RESULT CALLED TO, READ BACK BY AND VERIFIED WITH: Melissa Dunlap @1834  on 09/18/23 SKL (NOTE) The GeneXpert MRSA Assay (FDA approved for NASAL specimens only), is one component of a comprehensive MRSA colonization surveillance program. It is not intended to diagnose MRSA infection nor to guide or monitor treatment for MRSA infections. Test performance is not FDA approved in patients less than 43 years old. Performed at [facility], [address], [place], [place] [address]      Time coordinating discharge: Over 30 minutes  SIGNED:   Charise Killian, MD  [facility] 09/21/2023, 11:52 AM Pager   If 7PM-7AM, please contact night-coverage [email or web address]

## 2023-09-21 NOTE — Progress Notes (Signed)
 Wife notified by phone that patient was discharged this morning and nurse asked wife if transportation needed to be set up. Wife stated "no I transfer him myself", nurse then stated "are you able to get him in your car?". Wife stated that she always transferred patient from wheelchair to car. Wife requested that nurse have patient downstairs at 1230 for pickup. This nurse was in another patient room that was admitted from ER and NT called and stated she required assistance to transfer patient, this nurse could not go so NT called charge nurse. Charge nurse then called this nurse and stated patient was very weak was nurse sure he was to go by wheelchair. Nurse explained that wife had stated she was able to transfer patient. Charge nurse and NT were able to get patient to wheelchair. Wife had arrived to floor before the patient was wheeled down and this nurse witnessed charge nurse expressing his concerns about patient leaving as he was so weak had difficult to get in chair. Wife again stated " we got it", and this nurse again asked "are you sure you can get him in the car?" Wife continued walking out with patient and expressed that "me and her can do it" referring to another woman that was with her. NT later called nurse and stated that patient had fallen getting into car. Nurse went outside to patient pickup and patient was sitting on the ground by the front passenger door. NT and wife stated patient stood but was unable to turn and sit and was assisted to the ground slowly. Patient denies injury. Wife stated she did not want to go to ER and wanted to put patient in car and continue home. Then wife stated if sacral wound was bleeding patient did need to go to ER. Nurse and [employer] spoke to  wife asking how she would get patient in the house and she said "I will put him in the wheelchair". Nurse and others questioned if wife would be able to care for patient at home is she could not even get him in the car  here. Wife decided that she probably could not take patient home and maybe rehab was needed. Wife stated PT nor CM had discussed rehab with her this admission. Patient was lifted to stretcher and taken to ER for evaluation.

## 2023-09-21 NOTE — TOC CM/SW Note (Signed)
 Reviewed chart then requested PT/OT evals. TOC to follow up after evaluations are done.  Alfonso Ramus, LCSW Transitions of Care Department [phone]

## 2023-09-21 NOTE — ED Provider Notes (Signed)
 [facility] Provider Note    Event Date/Time   First MD Initiated Contact with Patient 09/21/23 1332     (approximate)   History   Fall   HPI  Dreshaun Stene is a 68 y.o. male with a history of CKD, DVT on Eliquis, neurogenic bladder requiring catheter who presents with weakness and concern for bleeding from his sacral wound.  The patient was just discharged from the hospital this morning.  The family reports that when they tried to put him in the car, he was so weak that he just slid down off the seat, getting tangled up with his Foley catheter.  He did not hit his head or injure himself otherwise.  The patient is able to lift his legs but is unable to ambulate on his own.  They report that physical therapy and evaluate the patient while he was in the hospital.  The patient only has his wife and daughter to take care of him and only gets physical therapy at home once a week.  They are concerned that he is not able to go home in his current condition.  They are also concerned that after the incident in the car, he started to have bleeding again from his sacral wound.  I have the past medical records.  The patient was just admitted to the hospitalist service and discharged this morning after presenting with bleeding to a sacral/buttock wound.  He was treated for hyperkalemia and sacral osteomyelitis.   Physical Exam   Triage Vital Signs: ED Triage Vitals  Encounter Vitals Group     BP 09/21/23 1331 (!) 147/90     Systolic BP Percentile --      Diastolic BP Percentile --      Pulse Rate 09/21/23 1331 85     Resp 09/21/23 1331 18     Temp 09/21/23 1331 97.8 F (36.6 C)     Temp Source 09/21/23 1331 Oral     SpO2 09/21/23 1331 100 %     Weight 09/21/23 1332 279 lb (126.6 kg)     Height 09/21/23 1332 5\' 6"  (1.676 m)     Head Circumference --      Peak Flow --      Pain Score 09/21/23 1332 0     Pain Loc --      Pain Education --      Exclude from  Growth Chart --     Most recent vital signs: Vitals:   09/21/23 1331  BP: (!) 147/90  Pulse: 85  Resp: 18  Temp: 97.8 F (36.6 C)  SpO2: 100%     General: Awake, no distress.  CV:  Good peripheral perfusion.  Resp:  Normal effort. Abd:  No distention.  Other:  Sacral wound clean and dry.  A few small areas with minimal amount of oozing blood.  No active bleeding.  5/5 motor strength in bilateral upper extremities.  4/5 motor strength bilateral lower extremities.   ED Results / Procedures / Treatments   Labs (all labs ordered are listed, but only abnormal results are displayed) Labs Reviewed  BASIC METABOLIC PANEL - Abnormal; Notable for the following components:      Result Value   Chloride 112 (*)    CO2 20 (*)    Glucose, Bld 139 (*)    BUN 41 (*)    Creatinine, Ser 2.32 (*)    GFR, Estimated 30 (*)    All other components within normal limits  CBC WITH DIFFERENTIAL/PLATELET - Abnormal; Notable for the following components:   RBC 3.36 (*)    Hemoglobin 9.3 (*)    HCT 29.8 (*)    RDW 17.2 (*)    All other components within normal limits     EKG  ED ECG REPORT I, Dionne Bucy, the attending physician, personally viewed and interpreted this ECG.  Date: 09/21/2023 EKG Time: 1337 Rate: 79 Rhythm: normal sinus rhythm QRS Axis: normal Intervals: normal ST/T Wave abnormalities: normal Narrative Interpretation: no evidence of acute ischemia   RADIOLOGY    PROCEDURES:  Critical Care performed: No  Procedures   MEDICATIONS ORDERED IN ED: Medications - No data to display   IMPRESSION / MDM / ASSESSMENT AND PLAN / ED COURSE  I reviewed the triage vital signs and the nursing notes.  68 year old male with PMH as noted above, just discharged from the hospital this afternoon presents with generalized weakness/deconditioning as well as a small amount of bleeding from his sacral wound.  On exam the vital signs are normal.  The patient is alert and  oriented and states he feels fine.  He has no focal neurologic deficits but does have global generalized weakness.  The primary concern of the family members is that they are unable to take care of him at home in his current state.  Differential diagnosis includes, but is not limited to, generalized weakness, deconditioning, dehydration.  Patient's presentation is most consistent with acute complicated illness / injury requiring diagnostic workup.  We will obtain basic labs, PT consult, and TOC consult to help arrange for an appropriate disposition.  If there are concerning lab findings with the patient develops new or worsening symptoms he may require consideration for admission.  ----------------------------------------- 2:52 PM on 09/21/2023 -----------------------------------------  CBC and BMP show chronic findings of anemia and CKD with no acute change from his labs over the last several days.  PT and TOC consults are pending.  The patient will be handed off to the oncoming ED physician at 3 PM.   FINAL CLINICAL IMPRESSION(S) / ED DIAGNOSES   Final diagnoses:  Generalized weakness     Rx / DC Orders   ED Discharge Orders     None        Note:  This document was prepared using Dragon voice recognition software and may include unintentional dictation errors.    Dionne Bucy, MD 09/21/23 1453

## 2023-09-21 NOTE — ED Notes (Signed)
 Wet to dry dressing changed on sacrum after EDP assessed. New dressing placed. Fall precautions initiated on Pt. CB in reach.

## 2023-09-21 NOTE — Plan of Care (Signed)

## 2023-09-21 NOTE — ED Notes (Signed)
 This RN did complete ostomy bag change due to damage to bag. New one in place.

## 2023-09-21 NOTE — ED Notes (Signed)
 [facility] sent down with save tube labels. Black/green top and grey top on ice.

## 2023-09-21 NOTE — ED Triage Notes (Signed)
 S: - Pt arrives via RNs, wife and nurse tech  B: - Medical [id number], DVT, AKI, HLD, HTN, chronic foley, TBI, Stage II sacrum  - Medications: A: - Assessment: Weakness - Vitals: - Interventions: stretcher into ER from medical mall area  - This RNs Assessment: Weakness, fall risk  * Airway: holds his own  * Breathing: RA  * Circulation: >3 seconds  R: - What brought Pt into ER today? Pt fell in hospital parking lot being discharged home today and got tangled in his foley catheter, when Pt fell wife asked to bring Pt back in

## 2023-09-22 ENCOUNTER — Emergency Department

## 2023-09-22 LAB — AEROBIC CULTURE W GRAM STAIN (SUPERFICIAL SPECIMEN)

## 2023-09-22 LAB — GLUCOSE, CAPILLARY
Glucose-Capillary: 116 mg/dL — ABNORMAL HIGH (ref 70–99)
Glucose-Capillary: 126 mg/dL — ABNORMAL HIGH (ref 70–99)
Glucose-Capillary: 100 mg/dL — ABNORMAL HIGH (ref 70–99)
Glucose-Capillary: 117 mg/dL — ABNORMAL HIGH (ref 70–99)
Glucose-Capillary: 91 mg/dL (ref 70–99)
Glucose-Capillary: 113 mg/dL — ABNORMAL HIGH (ref 70–99)
Glucose-Capillary: 90 mg/dL (ref 70–99)
Glucose-Capillary: 99 mg/dL (ref 70–99)

## 2023-09-22 NOTE — ED Notes (Signed)
 [facility]  called  for  transport  pt  home

## 2023-09-22 NOTE — ED Notes (Signed)
 Pt pulled up in bed and positioned to eat meal tray, cut up breakfast and gave pt additional cranberry juice.  Foley is draining.

## 2023-09-22 NOTE — Evaluation (Signed)
 Occupational Therapy Evaluation Patient Details Name: [name] MRN: [id number] DOB: 27-Jan-1956 Today's Date: 09/22/2023   History of Present Illness   Patient is a 68 year old male with recent discharge from hospital presenting with generalized weakness/deconditioning as well as a small amount of bleeding from his sacral wound. History of CKD, DVT on Eliquis, neurogenic bladder requiring catheter, TBI, sacral wound.   Clinical Impressions Mr Landrigan was seen for OT evaluation this date. Prior to hospital admission, pt was requiring assist for dressing, bathing, +1-2 for car transfers, and hoyer use for w/c transfers. Pt lives with spouse. Pt currently requires MAX A x2 sup<>sit. MOD A rolling bed level. MIN A seated grooming tasks, assist for balance 2/2 high stretcher height. MAX A x2 sit<>stand attempts, clears rear but no upright posture achieved with +2. Pt would benefit from skilled OT to address noted impairments and functional limitations (see below for any additional details). Upon hospital discharge, recommend OT follow up <3 hours/day.     If plan is discharge home, recommend the following:   Two people to help with walking and/or transfers;Two people to help with bathing/dressing/bathroom     Functional Status Assessment   Patient has had a recent decline in their functional status and demonstrates the ability to make significant improvements in function in a reasonable and predictable amount of time.     Equipment Recommendations   None recommended by OT     Recommendations for Other Services         Precautions/Restrictions   Precautions Precautions: Fall Recall of Precautions/Restrictions: Intact Restrictions Weight Bearing Restrictions Per Provider Order: No     Mobility Bed Mobility Overal bed mobility: Needs Assistance Bed Mobility: Supine to Sit, Sit to Supine, Rolling Rolling: Mod assist   Supine to sit: Max assist, +2 for  physical assistance Sit to supine: Max assist, +2 for physical assistance        Transfers Overall transfer level: Needs assistance Equipment used: 2 person hand held assist Transfers: Sit to/from Stand Sit to Stand: Max assist, +2 physical assistance           General transfer comment: clears rear but unable to achieve fully upright posture      Balance Overall balance assessment: Needs assistance Sitting-balance support: Bilateral upper extremity supported, Feet unsupported Sitting balance-Leahy Scale: Poor Sitting balance - Comments: MIN A stataic sitting from  stretcher height   Standing balance support: Bilateral upper extremity supported Standing balance-Leahy Scale: Zero                             ADL either performed or assessed with clinical judgement   ADL Overall ADL's : Needs assistance/impaired                                       General ADL Comments: MIN A seated grooming tasks. MAX A x2 for ADL t/f attemtps      Pertinent Vitals/Pain Pain Assessment Pain Assessment: Faces Faces Pain Scale: Hurts a little bit Pain Location: sacral area Pain Descriptors / Indicators: Discomfort Pain Intervention(s): Limited activity within patient's tolerance, Repositioned     Extremity/Trunk Assessment Upper Extremity Assessment Upper Extremity Assessment: Generalized weakness   Lower Extremity Assessment Lower Extremity Assessment: Generalized weakness       Communication Communication Communication: No apparent difficulties   Cognition Arousal: Alert Behavior  During Therapy: WFL for tasks assessed/performed Cognition: No apparent impairments                               Following commands: Impaired Following commands impaired: Follows one step commands with increased time     Cueing  General Comments   Cueing Techniques: Verbal cues;Tactile cues      Exercises     Shoulder Instructions       Home Living Family/patient expects to be discharged to:: Private residence Living Arrangements: Spouse/significant other Available Help at Discharge: Family;Available PRN/intermittently Type of Home: House Home Access: Ramped entrance     Home Layout: One level     Bathroom Shower/Tub: Walk-in shower         Home Equipment: Wheelchair - Forensic psychologist (2 wheels);Shower seat - built in;Grab bars - tub/shower;Hospital bed;Other (comment) (hoyer lift)          Prior Functioning/Environment Prior Level of Function : Needs assist;History of Falls (last six months)             Mobility Comments: patient reports family uses the hoyer lift in the home for transferrs to wheelchair. he can pivot from wheelchair to car with +2 person assistance without the lift. assistance required for wheelchair negotiation ADLs Comments: assistance for bathing, dressing, medication management. patient can feed himself    OT Problem List: Decreased strength;Decreased range of motion;Decreased activity tolerance;Impaired balance (sitting and/or standing)   OT Treatment/Interventions: Self-care/ADL training;Therapeutic exercise;Energy conservation;DME and/or AE instruction;Therapeutic activities      OT Goals(Current goals can be found in the care plan section)   Acute Rehab OT Goals Patient Stated Goal: to go home OT Goal Formulation: With patient Time For Goal Achievement: 10/06/23 Potential to Achieve Goals: Fair ADL Goals Pt Will Perform Grooming: with modified independence;sitting Pt Will Transfer to Toilet: with mod assist;stand pivot transfer;bedside commode Pt Will Perform Toileting - Clothing Manipulation and hygiene: with max assist;bed level (with no assist for rolling bed level portion)   OT Frequency:  Min 1X/week    Co-evaluation PT/OT/SLP Co-Evaluation/Treatment: Yes Reason for Co-Treatment: To address functional/ADL transfers PT goals addressed during session:  Mobility/safety with mobility OT goals addressed during session: ADL's and self-care      AM-PAC OT "6 Clicks" Daily Activity     Outcome Measure Help from another person eating meals?: None Help from another person taking care of personal grooming?: A Little Help from another person toileting, which includes using toliet, bedpan, or urinal?: A Lot Help from another person bathing (including washing, rinsing, drying)?: A Lot Help from another person to put on and taking off regular upper body clothing?: A Little Help from another person to put on and taking off regular lower body clothing?: A Lot 6 Click Score: 16   End of Session    Activity Tolerance: Patient tolerated treatment well Patient left: in bed;with call bell/phone within reach  OT Visit Diagnosis: Other abnormalities of gait and mobility (R26.89);Muscle weakness (generalized) (M62.81)                Time: 1610-9604 OT Time Calculation (min): 16 min Charges:  OT General Charges $OT Visit: 1 Visit OT Evaluation $OT Eval Low Complexity: 1 Low  Kathie Dike, M.S. OTR/L  09/22/23, 9:57 AM  ascom [phone]

## 2023-09-22 NOTE — ED Provider Notes (Addendum)
-----------------------------------------   8:51 AM on 09/22/2023 -----------------------------------------   Blood pressure 123/68, pulse 67, temperature 97.8 F (36.6 C), temperature source Oral, resp. rate 16, height 5\' 6"  (1.676 m), weight 126.6 kg, SpO2 100%.  The patient is calm and cooperative at this time.  There have been no acute events since the last update.  Awaiting disposition plan from case management/social work.  Doing well this morning.  Patient with left upper arm swelling and my evaluation.  Does state that he noticed some worsening swelling to his left upper extremity.  Patient is currently on Eliquis for DVT.  Ordered a left upper extremity ultrasound to further evaluate for DVT.  Upper extremity DVT was negative.  Patient and wife states that he does not want to stay in the hospital any longer and wants to go home.  States that he will follow-up with his primary care physician.  States that he feels safe going home with his wife.  No questions at time of discharge.    Corena Herter, MD 09/22/23 2595    Corena Herter, MD 09/22/23 1047    Corena Herter, MD 09/22/23 1432

## 2023-09-22 NOTE — ED Notes (Signed)
 Resumed care from ellen rn.  Pt alert.  Pt waiting on ems for transport.  No acute distress noted.  Pt in hallway bed.

## 2023-09-22 NOTE — Evaluation (Signed)
 Physical Therapy Evaluation Patient Details Name: [name] MRN: [id number] DOB: 04/16/1956 Today's Date: 09/22/2023  History of Present Illness  Patient is a 68 year old male with recent discharge from hospital presenting with generalized weakness/deconditioning as well as a small amount of bleeding from his sacral wound. History of CKD, DVT on Eliquis, neurogenic bladder requiring catheter, TBI, sacral wound.   Clinical Impression  Patient is agreeable to PT evaluation. He reports he requires assistance for mobility at baseline. At baseline, he has a hoyer lift inside the home for transfers as needed, and he reports he can get into the car with +2 person assistance for pivot transfer. He reports feeling generalized weakness today compared to his baseline. Extensive DME at home already.  Today the patient required +2 person assistance for bed mobility. Sitting balance is poor with external support required to maintain midline. He was able to partially clear hips from bed surface with bilateral knees blocked but unable to fully stand. He does not appear to be at his baseline level of functional independence. Consider rehabilitation < 3 hours/day after this hospital stay. However, patient prefers to go home if possible. PT will continue to follow to maximize independence and decrease caregiver burden.       If plan is discharge home, recommend the following: Two people to help with walking and/or transfers;A lot of help with bathing/dressing/bathroom;Assistance with cooking/housework;Assist for transportation;Help with stairs or ramp for entrance;Direct supervision/assist for medications management   Can travel by private vehicle   No    Equipment Recommendations None recommended by PT  Recommendations for Other Services       Functional Status Assessment Patient has had a recent decline in their functional status and demonstrates the ability to make significant improvements in  function in a reasonable and predictable amount of time.     Precautions / Restrictions Precautions Precautions: Fall Recall of Precautions/Restrictions: Intact Restrictions Weight Bearing Restrictions Per Provider Order: No      Mobility  Bed Mobility Overal bed mobility: Needs Assistance Bed Mobility: Supine to Sit, Sit to Supine, Rolling Rolling: Mod assist, +2 for physical assistance   Supine to sit: Max assist, +2 for physical assistance Sit to supine: Max assist, +2 for physical assistance   General bed mobility comments: verbal cues for technique and task initiation. +2 person assistance required for bed mobility    Transfers Overall transfer level: Needs assistance Equipment used: 2 person hand held assist Transfers: Sit to/from Stand Sit to Stand: Max assist, +2 physical assistance, From elevated surface           General transfer comment: patient is able to clear posterior hips x 2 bouts but unable to stand fully. bilateral knees blocked to prevent buckling    Ambulation/Gait                  Stairs            Wheelchair Mobility     Tilt Bed    Modified Rankin (Stroke Patients Only)       Balance Overall balance assessment: Needs assistance Sitting-balance support: Bilateral upper extremity supported Sitting balance-Leahy Scale: Poor Sitting balance - Comments: Min A required for safety   Standing balance support: Bilateral upper extremity supported Standing balance-Leahy Scale: Zero Standing balance comment: +2 person assistance required for standing balance  Pertinent Vitals/Pain Pain Assessment Pain Assessment: Faces Faces Pain Scale: Hurts a little bit Pain Location: sacral area Pain Descriptors / Indicators: Discomfort Pain Intervention(s): Limited activity within patient's tolerance, Monitored during session, Repositioned    Home Living Family/patient expects to be discharged  to:: Private residence Living Arrangements: Spouse/significant other Available Help at Discharge: Family;Available PRN/intermittently Type of Home: House Home Access: Ramped entrance       Home Layout: One level Home Equipment: Wheelchair - Forensic psychologist (2 wheels);Shower seat - built in;Grab bars - tub/shower;Hospital bed;Other (comment) (hoyer lift)      Prior Function Prior Level of Function : Needs assist;History of Falls (last six months)             Mobility Comments: patient reports family uses the hoyer lift in the home for transferrs to wheelchair. he can pivot from wheelchair to car with +2 person assistance without the lift. assistance required for wheelchair negotiation ADLs Comments: assistance for bathing, dressing, medication management. patient can feed himself     Extremity/Trunk Assessment   Upper Extremity Assessment Upper Extremity Assessment: Generalized weakness    Lower Extremity Assessment Lower Extremity Assessment: Generalized weakness       Communication   Communication Communication: No apparent difficulties    Cognition Arousal: Alert Behavior During Therapy: WFL for tasks assessed/performed   PT - Cognitive impairments: No apparent impairments                         Following commands: Impaired Following commands impaired: Follows one step commands with increased time     Cueing Cueing Techniques: Verbal cues, Tactile cues     General Comments      Exercises     Assessment/Plan    PT Assessment Patient needs continued PT services  PT Problem List Decreased strength;Decreased range of motion;Decreased activity tolerance;Decreased balance;Decreased mobility       PT Treatment Interventions DME instruction;Functional mobility training;Therapeutic activities;Therapeutic exercise;Balance training;Neuromuscular re-education;Cognitive remediation;Patient/family education;Wheelchair mobility training    PT  Goals (Current goals can be found in the Care Plan section)  Acute Rehab PT Goals Patient Stated Goal: to go home PT Goal Formulation: With patient Time For Goal Achievement: 10/06/23 Potential to Achieve Goals: Fair    Frequency Min 1X/week     Co-evaluation PT/OT/SLP Co-Evaluation/Treatment: Yes Reason for Co-Treatment: To address functional/ADL transfers PT goals addressed during session: Mobility/safety with mobility OT goals addressed during session: ADL's and self-care       AM-PAC PT "6 Clicks" Mobility  Outcome Measure Help needed turning from your back to your side while in a flat bed without using bedrails?: A Lot Help needed moving from lying on your back to sitting on the side of a flat bed without using bedrails?: Total Help needed moving to and from a bed to a chair (including a wheelchair)?: Total Help needed standing up from a chair using your arms (e.g., wheelchair or bedside chair)?: Total Help needed to walk in hospital room?: Total Help needed climbing 3-5 steps with a railing? : Total 6 Click Score: 7    End of Session   Activity Tolerance: Patient limited by fatigue Patient left: in bed;with call bell/phone within reach   PT Visit Diagnosis: Muscle weakness (generalized) (M62.81);Difficulty in walking, not elsewhere classified (R26.2)    Time: 8657-8469 PT Time Calculation (min) (ACUTE ONLY): 16 min   Charges:   PT Evaluation $PT Eval Low Complexity: 1 Low   PT General Charges $$  ACUTE PT VISIT: 1 Visit        Donna Bernard, PT, MPT   [name] 09/22/2023, 10:37 AM

## 2023-09-22 NOTE — ED Notes (Addendum)
 Transport--[facility] here for pt.  Pt alert.

## 2023-09-22 NOTE — ED Notes (Signed)
 Pt is resting, appears in no distress at this time

## 2023-09-22 NOTE — ED Notes (Signed)
 Pt tells me that he discussed with his wife and he would like transportation home and to be d/c.  Called pt spouse who confirms she would like him to be transported home as she can care for him and they have home health set up. Dr. Arnoldo Morale notified

## 2023-09-22 NOTE — ED Notes (Signed)
Social work speaking with pt.

## 2023-09-22 NOTE — ED Notes (Signed)
 Daily meds were given late as pt declined them earlier.  Pt ate lunchtray (staff cut food for him and he fed himself).  Foley cath is draining, pt changed into new gown and new blankets given after lunch.

## 2023-09-22 NOTE — TOC Transition Note (Signed)
 Transition of Care [facility]) - Discharge Note   Patient Details  Name: [name] MRN: [id number] Date of Birth: 1956/02/02  [facility] CM/SW Contact:  Elberta Fortis, RN Phone Number: 09/22/2023, 3:30 PM   Clinical Narrative: Met with pt in the ED. He lives at home with his spouse who is his primary caregiver. He has a wheelchair, hoyer lift, and denies needing any other DME. He fell in the hospital parking lot after being discharged yesterday, prompting him to be readmitted to the ED. PT/OT evals recommended SNF but pt wants to go home with home healthcare. He's already been using [facility] for SN and PT. Spoke with Elnita Maxwell at [facility] and they can accommodate OT as well. Orders received from the ED MD and is being arranged. Pt to be transported home today via ambulance.      Final next level of care: Home w Home Health Services Barriers to Discharge: Barriers Resolved   Patient Goals and CMS Choice            Discharge Placement                       Discharge Plan and Services Additional resources added to the After Visit Summary for                            HH Arranged: RN, PT, OT [facility] Agency: [facility] Date [facility] Agency Contacted: 09/22/23 Time HH Agency Contacted: [phone] Representative spoke with at [facility] Agency: Elnita Maxwell  Social Drivers of Health (SDOH) Interventions SDOH Screenings   Food Insecurity: Patient Declined (09/16/2023)  Housing: Patient Declined (09/17/2023)  Transportation Needs: Patient Declined (09/16/2023)  Utilities: Patient Declined (09/16/2023)  Depression (PHQ2-9): Medium Risk (12/04/2022)  Financial Resource Strain: Low Risk  (12/31/2020)   Received from [facility], [facility]  Physical Activity: Inactive (09/09/2017)  Social Connections: Unknown (09/17/2023)  Stress: No Stress Concern Present (09/09/2017)  Tobacco Use: Low Risk  (09/21/2023)     Readmission Risk Interventions     07/22/2023   11:12 AM 03/05/2023   12:55 PM 02/28/2023   11:21 AM  Readmission Risk Prevention Plan  Transportation Screening Complete  Complete  Medication Review [facility]) Complete  Complete  PCP or Specialist appointment within 3-5 days of discharge Complete  Complete  HRI or Home Care Consult   Complete  SW Recovery Care/Counseling Consult Complete  Complete  Palliative Care Screening Not Applicable  Not Applicable  Skilled Nursing Facility Complete Complete Not Applicable

## 2023-09-23 ENCOUNTER — Ambulatory Visit: Admitting: Physician Assistant

## 2023-09-30 ENCOUNTER — Encounter: Attending: Internal Medicine | Admitting: Internal Medicine

## 2023-09-30 DIAGNOSIS — Z87820 Personal history of traumatic brain injury: Secondary | ICD-10-CM | POA: Diagnosis not present

## 2023-09-30 DIAGNOSIS — E11622 Type 2 diabetes mellitus with other skin ulcer: Secondary | ICD-10-CM | POA: Diagnosis not present

## 2023-09-30 DIAGNOSIS — L89613 Pressure ulcer of right heel, stage 3: Secondary | ICD-10-CM | POA: Diagnosis not present

## 2023-09-30 DIAGNOSIS — F319 Bipolar disorder, unspecified: Secondary | ICD-10-CM | POA: Insufficient documentation

## 2023-09-30 DIAGNOSIS — Z7901 Long term (current) use of anticoagulants: Secondary | ICD-10-CM | POA: Insufficient documentation

## 2023-09-30 DIAGNOSIS — M8668 Other chronic osteomyelitis, other site: Secondary | ICD-10-CM | POA: Diagnosis not present

## 2023-09-30 DIAGNOSIS — M6281 Muscle weakness (generalized): Secondary | ICD-10-CM | POA: Insufficient documentation

## 2023-09-30 DIAGNOSIS — I10 Essential (primary) hypertension: Secondary | ICD-10-CM | POA: Diagnosis not present

## 2023-09-30 DIAGNOSIS — L97822 Non-pressure chronic ulcer of other part of left lower leg with fat layer exposed: Secondary | ICD-10-CM | POA: Insufficient documentation

## 2023-09-30 DIAGNOSIS — L89154 Pressure ulcer of sacral region, stage 4: Secondary | ICD-10-CM | POA: Insufficient documentation

## 2023-10-07 ENCOUNTER — Encounter: Payer: Self-pay | Admitting: Psychiatry

## 2023-10-07 ENCOUNTER — Ambulatory Visit: Admitting: Psychiatry

## 2023-10-07 VITALS — BP 128/78 | HR 91 | Temp 97.7°F

## 2023-10-07 DIAGNOSIS — F0390 Unspecified dementia without behavioral disturbance: Secondary | ICD-10-CM

## 2023-10-07 DIAGNOSIS — G4701 Insomnia due to medical condition: Secondary | ICD-10-CM

## 2023-10-07 DIAGNOSIS — F3181 Bipolar II disorder: Secondary | ICD-10-CM

## 2023-10-07 DIAGNOSIS — F411 Generalized anxiety disorder: Secondary | ICD-10-CM

## 2023-10-07 MED ORDER — OXCARBAZEPINE 300 MG PO TABS: 300.0000 mg | ORAL_TABLET | Freq: Two times a day (BID) | ORAL | 0 refills | Status: DC

## 2023-10-07 MED ORDER — QUETIAPINE FUMARATE 25 MG PO TABS: ORAL_TABLET | ORAL | 0 refills | Status: DC

## 2023-10-07 NOTE — Patient Instructions (Signed)
  [email or web address]  www.[email or web address]  [email or web address] [facility] [address], [place], [place] [address]   [phone]  [facility] [email or web address] [address], [place], [place] [address]  [phone]  [facility], [facility] [email or web address] [address], [place], [place] [address]  [phone]   Family solutions - [phone]  [facility] - [phone]  [facility] - [phone] counseling [phone]  [facility] [phone]   [email or web address] this clinician can offer telehealth and has a sliding scale option  [email or web address]-[email or web address] this group also offers sliding scale rates and is based out of [place]  Dr. Liborio Nixon with the [facility] specializes in divorce  [employer] [facility] has interns who offer sliding scale rates and some of the full time clinicians do, as well. You complete their contact form on their website and the referrals coordinator will help to get connected to someone   [email or web address] [phone]  Medicaid below :  [facility] [address] [place], [place] [address]  [phone]    [employer] [address] [place], [place] [address]  [phone]    [facility] [address] [place], [place] [address]  [phone]

## 2023-10-07 NOTE — Progress Notes (Signed)
 [facility] MD OP Progress Note  10/07/2023 12:49 PM [name]  MRN:  [id number]  Chief Complaint:  Chief Complaint  Patient presents with   Follow-up   Anxiety   Depression   Medication Refill   Memory Loss   Patient being a limited historian and collateral information obtained from wife who was present in session today. Discussed the use of AI scribe software for clinical note transcription with the patient, who gave verbal consent to proceed.  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 He frequently calls out for his caregiver and expresses concerns about being left alone, causing distress for both him and his caregiver.  Cognitive difficulties are evident, as he is unable to correctly draw a clock or recall the current date. He spends most of his time watching TV and occasionally goes out to eat. Physical therapy visits occur once a week, but he is not consistently engaging in exercises. His caregiver attempts to motivate him to be more active, but his participation is variable.  He has had several recent hospitalizations, including a visit to the emergency department on September 22, 2023, due to weakness and inability to stand. After discharge, he fell in the hospital parking lot, leading to a return to the emergency room. He has a history of sacral osteomyelitis, which led to an infection and bleeding that was difficult to control, necessitating emergency care.   His current  medications include levofloxacin 750 mg every other day due to kidney function.He was previously on quetiapine 25 mg as needed for agitation, but he ran out of this medication. He also takes Trileptal 300 mg twice daily, but there was a delay in refilling this medication.  Denies any suicidality, homicidality.        Visit Diagnosis:    ICD-10-CM   1. Bipolar II disorder, mild, depressed, with anxious distress (HCC)  F31.81 QUEtiapine (SEROQUEL) 25 MG tablet    Oxcarbazepine (TRILEPTAL) 300 MG tablet    2. GAD (generalized anxiety disorder)  F41.1 QUEtiapine (SEROQUEL) 25 MG tablet    Oxcarbazepine (TRILEPTAL) 300 MG tablet    3. Insomnia due to medical condition  G47.01 QUEtiapine (SEROQUEL) 25 MG tablet    Oxcarbazepine (TRILEPTAL) 300 MG tablet   mood disorder neurocognitive disorder    4. 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  Past Medical History:  Past Medical History:  Diagnosis Date   Acute exacerbation of CHF (congestive heart failure) (HCC)    Anemia    Bipolar disorder (HCC)    Bladder mass    Cellulitis    Chronic anticoagulation    Chronic indwelling Foley catheter    CKD (chronic kidney disease) stage 3, GFR 30-59 ml/min (HCC)    Colostomy in place [facility])    Diabetes mellitus without complication (HCC)    Frontotemporal dementia (HCC)    Heel ulcer (HCC)    Left   Hematuria    History of blood clots  Hydronephrosis of right kidney    Hyperkalemia    Hypertension    Hyponatremia    Lithium toxicity    Metabolic acidosis    Neurogenic bladder    Obesity    Sacral decubitus ulcer    Sepsis (HCC)    TBI (traumatic brain injury) (HCC)    UTI (urinary tract infection)    VRE (vancomycin resistant enterococcus) culture positive     Past Surgical History:  Procedure Laterality Date   BACK  SURGERY     CARPAL TUNNEL RELEASE Bilateral    COLON SURGERY     COLOSTOMY     CYSTOSCOPY W/ RETROGRADES Right 10/25/2022   Procedure: CYSTOSCOPY WITH RETROGRADE PYELOGRAM;  Surgeon: Sondra Come, MD;  Location: [facility] ORS;  Service: Urology;  Laterality: Right;   IR PERC TUN PERIT CATH WO PORT S&I /IMAG  10/11/2021   IR REMOVAL TUN CV CATH W/O FL  11/19/2021   IR US GUIDE VASC ACCESS RIGHT  10/11/2021   SACRAL DECUBITUS ULCER EXCISION     TONSILLECTOMY      Family Psychiatric History: I have reviewed family psychiatric history from progress note on 09/09/2017.  Family History:  Family History  Problem Relation Age of Onset   Depression Father    Deep vein thrombosis Father     Social History: I have reviewed social history from progress note on 09/09/2017. Social History   Socioeconomic History   Marital status: Married    Spouse name: thelma   Number of children: 0   Years of education: Not on file   Highest education level: Associate degree: occupational, Scientist, product/process development, or vocational program  Occupational History   Not on file  Tobacco Use   Smoking status: Never    Passive exposure: Never   Smokeless tobacco: Never  Vaping Use   Vaping status: Never Used  Substance and Sexual Activity   Alcohol use: Not Currently   Drug use: No   Sexual activity: Not Currently  Other Topics Concern   Not on file  Social History Narrative   Not on file   Social Drivers of Health   Financial Resource Strain: Low Risk  (12/31/2020)   [facility]   Overall Financial Resource Strain (CARDIA)    Difficulty of Paying Living Expenses: Not very hard  Food Insecurity: Patient Declined (09/16/2023)   Hunger Vital Sign    Worried About Running Out of Food in the Last Year: Patient declined    Ran Out of Food in the Last Year: Patient declined  Transportation Needs: Patient Declined (09/16/2023)   PRAPARE - Administrator, Civil Service  (Medical): Patient declined    Lack of Transportation (Non-Medical): Patient declined  Physical Activity: Inactive (09/09/2017)   Exercise Vital Sign    Days of Exercise per Week: 0 days    Minutes of Exercise per Session: 0 min  Stress: No Stress Concern Present (09/09/2017)   [employer] of Occupational Health - Occupational Stress Questionnaire    Feeling of Stress : Not at all  Social Connections: Unknown (09/17/2023)   Social Connection and Isolation Panel [NHANES]    Frequency of Communication with Friends and Family: Patient declined    Frequency of Social Gatherings with Friends and Family: Patient declined    Attends Religious Services: Patient declined    Database administrator or Organizations: Patient declined    Attends Banker Meetings: Patient declined    Marital Status:  Married    Allergies: No Known Allergies  Metabolic Disorder Labs: Lab Results  Component Value Date   HGBA1C 5.9 (H) 07/19/2023   MPG 122.63 07/19/2023   MPG 154 09/19/2022   No results found for: "PROLACTIN" Lab Results  Component Value Date   CHOL 141 04/14/2021   TRIG 45 04/14/2021   HDL 44 04/14/2021   CHOLHDL 3.2 04/14/2021   VLDL 9 04/14/2021   LDLCALC 88 04/14/2021   Lab Results  Component Value Date   TSH 2.462 07/29/2021   TSH 2.175 04/14/2021    Therapeutic Level Labs: Lab Results  Component Value Date   LITHIUM 1.24 (H) 01/04/2016   LITHIUM 1.51 (HH) 01/03/2016   No results found for: "VALPROATE" No results found for: "CBMZ"  Current Medications: Current Outpatient Medications  Medication Sig Dispense Refill   amLODipine-olmesartan (AZOR) 5-20 MG tablet Take 1 tablet by mouth daily.     Continuous Blood Gluc Receiver (DEXCOM G7 RECEIVER) DEVI UAD for blood sugar monitoring.     ELIQUIS 5 MG TABS tablet Take 5 mg by mouth 2 (two) times daily.      glipiZIDE (GLUCOTROL XL) 2.5 MG 24 hr tablet Take 2.5 mg by mouth daily.     levofloxacin (LEVAQUIN)  750 MG tablet Take 750 mg by mouth as directed. Takes it every other day     memantine (NAMENDA) 10 MG tablet Take 10 mg by mouth 2 (two) times daily.     QUEtiapine (SEROQUEL) 25 MG tablet Take 1 tablet (25 mg total) by mouth at bedtime. May also take 1 tablet (25 mg total) daily as needed (for anxiety, agitation). 180 tablet 0   rosuvastatin (CRESTOR) 20 MG tablet Take 20 mg by mouth at bedtime.     vitamin B-12 (CYANOCOBALAMIN) 1000 MCG tablet Take 1,000 mcg by mouth daily.     Oxcarbazepine (TRILEPTAL) 300 MG tablet Take 1 tablet (300 mg total) by mouth 2 (two) times daily. 180 tablet 0   No current facility-administered medications for this visit.     Musculoskeletal: Strength & Muscle Tone:  limited Gait & Station:  in a wheel chair Patient leans: N/A  Psychiatric Specialty Exam: Review of Systems  Unable to perform ROS: Dementia    Blood pressure 128/78, pulse 91, temperature 97.7 F (36.5 C), temperature source Temporal, SpO2 96%.There is no height or weight on file to calculate BMI.  General Appearance: Casual  Eye Contact:  Good  Speech:  Clear and Coherent  Volume:  Normal  Mood:  Anxious  Affect:  Appropriate  Thought Process:  Goal Directed and Descriptions of Associations: Intact  Orientation:  Other:  Self, situation, month  Thought Content: Delusions   Suicidal Thoughts:  No  Homicidal Thoughts:  No  Memory:  Immediate;   Fair  Judgement:  Poor  Insight:  Shallow  Psychomotor Activity:  Tremor  Concentration:  Concentration: Poor and Attention Span: Poor  Recall:  Poor  Fund of Knowledge: Fair  Language: Fair  Akathisia:  No  Handed:  Right  AIMS (if indicated): Has tremors of bilateral hands  Assets:  Desire for Improvement Housing Transportation  ADL's: Needs support with ADLs  Cognition: Impaired,  Mild likely mild, FTD  Sleep:  Poor   Screenings: AIMS    Flowsheet Row Office Visit from 12/04/2022 in [facility] Office Visit from 02/27/2022 in [facility] Office Visit from 03/10/2018 in [facility]  AIMS Total Score 0 0 0      GAD-7    Flowsheet Row Office Visit from 12/04/2022 in [facility] Office Visit from 02/27/2022 in [facility]  Total GAD-7 Score 11 2      PHQ2-9    Flowsheet Row Office Visit from 10/07/2023 in [facility] Office Visit from 12/04/2022 in [facility] Office Visit from 06/04/2022 in [facility] Office Visit from 02/27/2022 in [facility] Video Visit from 11/08/2021 in [facility]  PHQ-2 Total Score 5 2 1 1  0  PHQ-9 Total Score 13 7 1  -- --      Flowsheet Row Office Visit from 10/07/2023 in [facility] ED from 09/21/2023 in [facility] Emergency Department at [facility] ED to Hosp-Admission (Discharged) from 09/16/2023 in [facility] (1A)  C-SSRS RISK CATEGORY No Risk No Risk No Risk        Assessment and Plan: Jayen Bromwell is a 68 year old African-American male with history of bipolar disorder type II, diabetes mellitus, chronic kidney disease stage IIIb, congestive heart failure, multiple other medical problem was evaluated in office today, presented for follow-up appointment.  Discussed assessment and plan as noted below.   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 Cognitive impairment is evident in  tasks like clock drawing and memory recall. A structured environment and engagement are crucial for symptom management. Neurology involvement is necessary for FTD-related behaviors, and psychotherapy is needed for anxiety and behavioral issues. -Advised to follow up neurologist for further evaluation and management of FTD. - Provide information for individual therapists in the area for psychotherapy. - Encourage engagement in activities such as reading, coloring, and word search to exercise his brain. - Reinstate Seroquel 25 mg at bedtime every night and an extra dose during the day as needed for agitation.  Bipolar Disorder type II most recent episode depressed-unstable Bipolar disorder may contribute to mood instability and behavioral issues. Medication management and psychotherapy are essential. - Continue Trileptal 300 mg twice daily. - Restart Seroquel as noted above. - Provide information for individual therapists in the area for psychotherapy.  Generalized Anxiety Disorder-unstable Significant anxiety, particularly related to being alone, is exacerbated by cognitive decline and FTD-related behavioral changes. Medication and therapy are essential for managing anxiety. - Reinstate Seroquel 25 mg at bedtime every night and an extra dose during the day as needed for anxiety. - Provide information for individual therapists in the area for psychotherapy.  Insomnia-unstable Patient with sleep issues, varies from day to day. - Work on sleep hygiene techniques - Restart Seroquel 25 mg at bedtime.  Hold if excessive sleepiness during the day.  Collateral information obtained from wife who was present in session today.  I have reviewed notes from most recent emergency department visit dated 09/16/2023 - 09/21/2023-patient with generalized weakness sacral osteomyelitis currently on levofloxacin every other day.   Follow-up Coordination with neurology and psychotherapy services is crucial for  managing Larwence's complex medical conditions. - Schedule an appointment with a neurologist in [employer]. - Follow up in 6-8 weeks with the psychiatrist.    Collaboration of Care: Collaboration of Care: Other encouraged to follow up with neurology.  Patient/Guardian  was advised Release of Information must be obtained prior to any record release in order to collaborate their care with an outside provider. Patient/Guardian was advised if they have not already done so to contact the registration department to sign all necessary forms in order for [place] to release information regarding their care.   Consent: Patient/Guardian gives verbal consent for treatment and assignment of benefits for services provided during this visit. Patient/Guardian expressed understanding and agreed to proceed.  This note was generated in part or whole with voice recognition software. Voice recognition is usually quite accurate but there are transcription errors that can and very often do occur. I apologize for any typographical errors that were not detected and corrected.     [name], MD 10/07/2023, 12:49 PM

## 2023-10-14 ENCOUNTER — Encounter: Admitting: Physician Assistant

## 2023-10-14 DIAGNOSIS — L89154 Pressure ulcer of sacral region, stage 4: Secondary | ICD-10-CM | POA: Diagnosis not present

## 2023-10-28 ENCOUNTER — Encounter: Admitting: Physician Assistant

## 2023-10-28 DIAGNOSIS — L89154 Pressure ulcer of sacral region, stage 4: Secondary | ICD-10-CM | POA: Diagnosis not present

## 2023-10-29 ENCOUNTER — Ambulatory Visit: Admitting: Urology

## 2023-10-29 DIAGNOSIS — R3989 Other symptoms and signs involving the genitourinary system: Secondary | ICD-10-CM

## 2023-10-29 DIAGNOSIS — Z792 Long term (current) use of antibiotics: Secondary | ICD-10-CM | POA: Diagnosis not present

## 2023-10-29 DIAGNOSIS — R3129 Other microscopic hematuria: Secondary | ICD-10-CM

## 2023-10-29 DIAGNOSIS — N453 Epididymo-orchitis: Secondary | ICD-10-CM

## 2023-10-29 DIAGNOSIS — R339 Retention of urine, unspecified: Secondary | ICD-10-CM

## 2023-10-29 DIAGNOSIS — N319 Neuromuscular dysfunction of bladder, unspecified: Secondary | ICD-10-CM

## 2023-10-29 MED ORDER — CEFDINIR 300 MG PO CAPS: 300.0000 mg | ORAL_CAPSULE | Freq: Two times a day (BID) | ORAL | 0 refills | Status: DC

## 2023-10-29 MED ORDER — CEFTRIAXONE SODIUM 1 G IJ SOLR
1.0000 g | Freq: Once | INTRAMUSCULAR | Status: AC
  Administered 2023-10-29: 1 g via INTRAMUSCULAR

## 2023-10-30 LAB — URINALYSIS, COMPLETE
Bilirubin, UA: NEGATIVE
Glucose, UA: NEGATIVE
Ketones, UA: NEGATIVE
Nitrite, UA: NEGATIVE
Specific Gravity, UA: 1.025 (ref 1.005–1.030)
Urobilinogen, Ur: 0.2 mg/dL (ref 0.2–1.0)
pH, UA: 6 (ref 5.0–7.5)

## 2023-10-30 LAB — MICROSCOPIC EXAMINATION: WBC, UA: >30 /HPF — AB (ref 0–5)

## 2023-11-01 NOTE — Progress Notes (Signed)
 10/29/2023 8:56 PM   [name] 18-Apr-1956 [id number]  Referring provider: Donato Fu, MD [address] [place],  [place] [address]  Urological history: 1. 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 High risk hematuria - non - smoker - cystoscopy w/ right retrograde (09/2022) j hooking of right distal ureter, no tumors and a wide open prostatic fossa from prior TURP   Chief Complaint  Patient presents with   Catheter exchange    HPI: [name] is a 68 y.o. man who presents today for catheter exchange with his wife, Dietrich Fragmin.    Previous records reviewed.   He has been having his Foley exchanged by home health, but is has most recently been changed during his recent admission for a sacral wound in March.   They do not report any issues with the catheter.    Noncontrast CT performed while he was hospitalized did not demonstrate any hydronephrosis, renal masses or nephrolithiasis.  It did demonstrate prostatomegaly and the catheter balloon was contained within the prostate.  Mr. Orejel did express that he was experiencing testicular soreness.  He could not specify the time frame and this was new information to his wife.  Patient denies any modifying or aggravating factors.  Patient denies any suprapubic/flank pain.  Patient denies any fevers, chills, nausea or vomiting.    UA is yellow cloudy, specific gravity 1.025, 2+ heme, pH 6.0, 2+ protein, 2+ leukocytes, greater than 30 WBCs, 11-30 RBCs, 0-10 epithelial cells, mucus threads present and moderate bacteria.  PMH: Past Medical History:  Diagnosis Date   Acute exacerbation of CHF (congestive heart failure) (HCC)    Anemia    Bipolar disorder (HCC)    Bladder mass    Cellulitis    Chronic anticoagulation    Chronic indwelling Foley catheter    CKD (chronic kidney disease) stage 3, GFR 30-59 ml/min (HCC)    Colostomy in place (HCC)     Diabetes mellitus without complication (HCC)    Frontotemporal dementia (HCC)    Heel ulcer (HCC)    Left   Hematuria    History of blood clots    Hydronephrosis of right kidney    Hyperkalemia    Hypertension    Hyponatremia    Lithium  toxicity    Metabolic acidosis    Neurogenic bladder    Obesity    Sacral decubitus ulcer    Sepsis (HCC)    TBI (traumatic brain injury) (HCC)    UTI (urinary tract infection)    VRE (vancomycin  resistant enterococcus) culture positive     Surgical History: Past Surgical History:  Procedure Laterality Date   BACK SURGERY     CARPAL TUNNEL RELEASE Bilateral    COLON SURGERY     COLOSTOMY     CYSTOSCOPY W/ RETROGRADES Right 10/25/2022   Procedure: CYSTOSCOPY WITH RETROGRADE PYELOGRAM;  Surgeon: Lawerence Pressman, MD;  Location: [facility] ORS;  Service: Urology;  Laterality: Right;   IR PERC TUN PERIT CATH WO PORT S&I /IMAG  10/11/2021   IR REMOVAL TUN CV CATH W/O FL  11/19/2021   IR US  GUIDE VASC ACCESS RIGHT  10/11/2021   SACRAL DECUBITUS ULCER EXCISION     TONSILLECTOMY      Home Medications:  Allergies as of 10/29/2023   No Known Allergies      Medication List        Accurate as of October 29, 2023 11:59 PM.  If you have any questions, ask your nurse or doctor.          STOP taking these medications    levofloxacin  750 MG tablet Commonly known as: LEVAQUIN        TAKE these medications    amLODipine -olmesartan  5-20 MG tablet Commonly known as: AZOR  Take 1 tablet by mouth daily.   cefdinir  300 MG capsule Commonly known as: OMNICEF  Take 1 capsule (300 mg total) by mouth 2 (two) times daily.   cyanocobalamin  1000 MCG tablet Commonly known as: VITAMIN B12 Take 1,000 mcg by mouth daily.   Dexcom G7 Receiver Devi UAD for blood sugar monitoring.   Eliquis  5 MG Tabs tablet Generic drug: apixaban  Take 5 mg by mouth 2 (two) times daily.   glipiZIDE  2.5 MG 24 hr tablet Commonly known as: GLUCOTROL  XL Take 2.5 mg by  mouth daily.   memantine  10 MG tablet Commonly known as: NAMENDA  Take 10 mg by mouth 2 (two) times daily.   Oxcarbazepine  300 MG tablet Commonly known as: TRILEPTAL  Take 1 tablet (300 mg total) by mouth 2 (two) times daily.   QUEtiapine  25 MG tablet Commonly known as: SEROquel  Take 1 tablet (25 mg total) by mouth at bedtime. May also take 1 tablet (25 mg total) daily as needed (for anxiety, agitation).   rosuvastatin  20 MG tablet Commonly known as: CRESTOR  Take 20 mg by mouth at bedtime.        Allergies: No Known Allergies  Family History: Family History  Problem Relation Age of Onset   Depression Father    Deep vein thrombosis Father     Social History:  reports that he has never smoked. He has never been exposed to tobacco smoke. He has never used smokeless tobacco. He reports that he does not currently use alcohol. He reports that he does not use drugs.  ROS: Pertinent ROS in HPI  Physical Exam: Constitutional:  Well nourished. Alert and oriented, No acute distress. HEENT: [place] AT, moist mucus membranes.  Trachea midline Cardiovascular: No clubbing, cyanosis, or edema. Respiratory: Normal respiratory effort, no increased work of breathing. GU: Patient with uncircumcised phallus.  Foreskin w/ moderate edema.  Foley catheter in place.  Urine yellow cloudy, No penile discharge. No penile lesions or rashes.  Right hemi scrotum without lesions, cysts, rashes and/or edema.  Left hemi scrotum is enlarged, with erythema, induration, but no crepitus or fluctuance is noted.  It is tender to palpation.  Right testicle is located scrotally.  No masses are appreciated in the right testicle.  Right epididymis is normal. Neurologic: Grossly intact, no focal deficits, moving all 4 extremities. Psychiatric: Normal mood and affect.  Laboratory Data: Lab Results  Component Value Date   WBC 6.0 09/21/2023   HGB 9.3 (L) 09/21/2023   HCT 29.8 (L) 09/21/2023   MCV 88.7 09/21/2023   PLT  187 09/21/2023    Lab Results  Component Value Date   CREATININE 2.32 (H) 09/21/2023    Lab Results  Component Value Date   HGBA1C 5.9 (H) 07/19/2023    Lab Results  Component Value Date   AST 22 09/16/2023   Lab Results  Component Value Date   ALT 19 09/16/2023    Urinalysis See EPIC and HPI  I have reviewed the labs.   Pertinent Imaging: CLINICAL DATA:  Sacral wound.   EXAM: CT ABDOMEN AND PELVIS WITHOUT CONTRAST   TECHNIQUE: Multidetector CT imaging of the abdomen and pelvis was performed following the standard protocol without IV  contrast.   RADIATION DOSE REDUCTION: This exam was performed according to the departmental dose-optimization program which includes automated exposure control, adjustment of the mA and/or kV according to patient size and/or use of iterative reconstruction technique.   COMPARISON:  February 27, 2023.   FINDINGS: Lower chest: No acute abnormality.   Hepatobiliary: No focal liver abnormality is seen. No gallstones, gallbladder wall thickening, or biliary dilatation.   Pancreas: Unremarkable. No pancreatic ductal dilatation or surrounding inflammatory changes.   Spleen: Normal in size without focal abnormality.   Adrenals/Urinary Tract: Adrenal glands are unremarkable. Kidneys are normal, without renal calculi, focal lesion, or hydronephrosis. Bladder is unremarkable.   Stomach/Bowel: Stomach is unremarkable. Loop colostomy is noted in left lower quadrant. There is no evidence of bowel obstruction or inflammation. The appendix appears normal.   Vascular/Lymphatic: Aortic atherosclerosis. No enlarged abdominal or pelvic lymph nodes.   Reproductive: Mildly enlarged prostate gland is noted. Foley catheter balloon has been inflated within the gland. Repositioning is recommended.   Other: No ascites is noted. 5.4 x 3.3 cm ulceration is seen extending from inferior buttocks region 2 inferior portion of coccyx. Contains debris  consistent with abscess. There appears to be some degree of lytic destruction involving the coccyx suggesting osteomyelitis.   Musculoskeletal: As noted above, there appears to be some degree of lytic destruction involving the coccyx suggesting osteomyelitis.   IMPRESSION: Mildly enlarged prostate gland is noted. Foley catheter balloon is inflated within the gland. Repositioning is recommended.   5.4 x 3.3 cm soft tissue ulceration or wound is seen extending from inferior buttocks regions toward inferior portion of coccyx. It contains debris consistent with abscess. There does appear to be some degree of lytic destruction involving the adjacent coccyx suggesting osteomyelitis.   Aortic Atherosclerosis (ICD10-I70.0).     Electronically Signed   By: Rosalene Colon M.D.   On: 09/16/2023 15:32 I have independently reviewed the films.  See HPI.    Cath Change/ Replacement Patient is present today for a catheter change due to urinary retention.  8 ml of water was removed from the balloon, a 14 FR Sicone foley cath was removed without difficulty.  Patient was cleaned and prepped in a sterile fashion with betadine and 2% lidocaine  jelly was instilled into the urethra. A 16  FR foley cath was replaced into the bladder, no complications were noted. Urine return was noted 30 ml and urine was yellow in color. The balloon was filled with 10ml of sterile water. A leg bag was attached for drainage.  A night bag was also given to the patient and patient was given instruction on how to change from one bag to another. Patient was given proper instruction on catheter care.    Performed by: Matilde Son, PA-C and Humberta Magallon-Mariche, CMA    Assessment & Plan:    1.  Left orchitis and epididymitis - UA grossly infected - urine culture pending, will narrow antibiotic therapy once culture results are available - cefTRIAXone  (ROCEPHIN ) injection 1 g - cefdinir  (OMNICEF ) 300 MG capsule; Take 1  capsule (300 mg total) by mouth 2 (two) times daily.  Dispense: 28 capsule; Refill: 0 - Reviewed red flag signs (fever/chills, enlargement of the scrotum, drainage from the scrotum, increase of pain in the scrotum and/or skin changes in the scrotum)  2. Neurogenic bladder - Foley catheter exchanged today - Continue with monthly exchanges  3.  Microscopic hematuria - Likely due to infection - Will recheck urine in 2 months to  ensure resolution once infection has been appropriately treated  Return in about 2 weeks (around 11/12/2023) for recheck scrotum .  These notes generated with voice recognition software. I apologize for typographical errors.  Briant Camper  [facility] [address]  [address] [place], [place] [address] [phone]

## 2023-11-05 LAB — CULTURE, URINE COMPREHENSIVE

## 2023-11-06 ENCOUNTER — Other Ambulatory Visit: Payer: Self-pay

## 2023-11-06 MED ORDER — TETRACYCLINE HCL 500 MG PO TABS: 500.0000 mg | ORAL_TABLET | Freq: Two times a day (BID) | ORAL | 0 refills | Status: AC

## 2023-11-11 ENCOUNTER — Ambulatory Visit (INDEPENDENT_AMBULATORY_CARE_PROVIDER_SITE_OTHER): Admitting: Urology

## 2023-11-11 ENCOUNTER — Encounter: Payer: Self-pay | Admitting: Urology

## 2023-11-11 ENCOUNTER — Encounter: Attending: Physician Assistant | Admitting: Physician Assistant

## 2023-11-11 VITALS — BP 142/72 | HR 66 | Ht 66.0 in | Wt 279.0 lb

## 2023-11-11 DIAGNOSIS — L97822 Non-pressure chronic ulcer of other part of left lower leg with fat layer exposed: Secondary | ICD-10-CM | POA: Insufficient documentation

## 2023-11-11 DIAGNOSIS — M8668 Other chronic osteomyelitis, other site: Secondary | ICD-10-CM | POA: Insufficient documentation

## 2023-11-11 DIAGNOSIS — N453 Epididymo-orchitis: Secondary | ICD-10-CM | POA: Diagnosis not present

## 2023-11-11 DIAGNOSIS — L89613 Pressure ulcer of right heel, stage 3: Secondary | ICD-10-CM | POA: Diagnosis not present

## 2023-11-11 DIAGNOSIS — M6281 Muscle weakness (generalized): Secondary | ICD-10-CM | POA: Diagnosis not present

## 2023-11-11 DIAGNOSIS — Z7901 Long term (current) use of anticoagulants: Secondary | ICD-10-CM | POA: Diagnosis not present

## 2023-11-11 DIAGNOSIS — L89154 Pressure ulcer of sacral region, stage 4: Secondary | ICD-10-CM | POA: Diagnosis present

## 2023-11-11 DIAGNOSIS — E11622 Type 2 diabetes mellitus with other skin ulcer: Secondary | ICD-10-CM | POA: Diagnosis not present

## 2023-11-11 DIAGNOSIS — N319 Neuromuscular dysfunction of bladder, unspecified: Secondary | ICD-10-CM

## 2023-11-11 DIAGNOSIS — Z87820 Personal history of traumatic brain injury: Secondary | ICD-10-CM | POA: Diagnosis not present

## 2023-11-11 DIAGNOSIS — N4889 Other specified disorders of penis: Secondary | ICD-10-CM

## 2023-11-11 DIAGNOSIS — I10 Essential (primary) hypertension: Secondary | ICD-10-CM | POA: Diagnosis not present

## 2023-11-11 DIAGNOSIS — F319 Bipolar disorder, unspecified: Secondary | ICD-10-CM | POA: Diagnosis not present

## 2023-11-11 MED ORDER — OXYBUTYNIN CHLORIDE 5 MG PO TABS: 5.0000 mg | ORAL_TABLET | Freq: Three times a day (TID) | ORAL | 0 refills | Status: DC | PRN

## 2023-11-11 NOTE — Progress Notes (Signed)
 11/11/2023 2:02 PM   [name] [birth year] [id number]  Referring provider: Donato Fu, MD [address] [place],  [place] [address]  Urological history: 1.  Neurogenic bladder -Since early 2000 secondary to TBI -Managed by indwelling Foley  2. High risk  hematuria  -non-smoker -non contrast CT (01/2023) - mild hydronephrosis -cysto/right retrograde (09/2022) -J hooking of right distal ureter, no tumors and wide open prostatic fossa from the prior TURP  Chief Complaint  Patient presents with   Follow-up   HPI: [name] is a 68 y.o. male who presents today for 2-week follow-up for epididymitis and orchitis with his wife, Dietrich Fragmin.    Previous records reviewed.   At his visit on October 29, 2023, he presented for Foley catheter exchange.  They also mentioned that his scrotum had been swollen and tender.  When I examined him I noted left orchitis and epididymitis.  I placed him on Omnicef  while we waited for urine cultures to be performed.   Urine culture was positive for Acinetobacter baumannii.  It was resistant to the Omnicef  so he was switched to tetracycline .  Today, he states he feels better.  He has been taking the tetracycline .  His wife mentioned that he has been leaking around the catheter.  Patient denies any modifying or aggravating factors.  Patient denies any recent UTI's, gross hematuria, dysuria or suprapubic/flank pain.  Patient denies any fevers, chills, nausea or vomiting.    PMH: Past Medical History:  Diagnosis Date   Acute exacerbation of CHF (congestive heart failure) (HCC)    Anemia    Bipolar disorder (HCC)    Bladder mass    Cellulitis    Chronic anticoagulation    Chronic indwelling Foley catheter    CKD (chronic kidney disease) stage 3, GFR 30-59 ml/min (HCC)    Colostomy in place (HCC)    Diabetes mellitus without complication (HCC)    Frontotemporal dementia (HCC)    Heel ulcer (HCC)    Left   Hematuria     History of blood clots    Hydronephrosis of right kidney    Hyperkalemia    Hypertension    Hyponatremia    Lithium  toxicity    Metabolic acidosis    Neurogenic bladder    Obesity    Sacral decubitus ulcer    Sepsis (HCC)    TBI (traumatic brain injury) (HCC)    UTI (urinary tract infection)    VRE (vancomycin  resistant enterococcus) culture positive     Surgical History: Past Surgical History:  Procedure Laterality Date   BACK SURGERY     CARPAL TUNNEL RELEASE Bilateral    COLON SURGERY     COLOSTOMY     CYSTOSCOPY W/ RETROGRADES Right 10/25/2022   Procedure: CYSTOSCOPY WITH RETROGRADE PYELOGRAM;  Surgeon: Lawerence Pressman, MD;  Location: [facility] ORS;  Service: Urology;  Laterality: Right;   IR PERC TUN PERIT CATH WO PORT S&I /IMAG  10/11/2021   IR REMOVAL TUN CV CATH W/O FL  11/19/2021   IR US  GUIDE VASC ACCESS RIGHT  10/11/2021   SACRAL DECUBITUS ULCER EXCISION     TONSILLECTOMY      Home Medications:  Allergies as of 11/11/2023   No Known Allergies      Medication List        Accurate as of Nov 11, 2023  2:02 PM. If you have any questions, ask your nurse or doctor.          amLODipine -olmesartan  5-20 MG  tablet Commonly known as: AZOR  Take 1 tablet by mouth daily.   cyanocobalamin  1000 MCG tablet Commonly known as: VITAMIN B12 Take 1,000 mcg by mouth daily.   Dexcom G7 Receiver Devi UAD for blood sugar monitoring.   Eliquis  5 MG Tabs tablet Generic drug: apixaban  Take 5 mg by mouth 2 (two) times daily.   glipiZIDE  2.5 MG 24 hr tablet Commonly known as: GLUCOTROL  XL Take 2.5 mg by mouth daily.   memantine  10 MG tablet Commonly known as: NAMENDA  Take 10 mg by mouth 2 (two) times daily.   Oxcarbazepine  300 MG tablet Commonly known as: TRILEPTAL  Take 1 tablet (300 mg total) by mouth 2 (two) times daily.   oxybutynin 5 MG tablet Commonly known as: DITROPAN Take 1 tablet (5 mg total) by mouth every 8 (eight) hours as needed for bladder  spasms. Started by: Matilde Son   QUEtiapine  25 MG tablet Commonly known as: SEROquel  Take 1 tablet (25 mg total) by mouth at bedtime. May also take 1 tablet (25 mg total) daily as needed (for anxiety, agitation).   rosuvastatin  20 MG tablet Commonly known as: CRESTOR  Take 20 mg by mouth at bedtime.   Tetracycline  HCl 500 MG Tabs Take 500 mg by mouth 2 (two) times daily for 14 days.        Allergies: No Known Allergies  Family History: Family History  Problem Relation Age of Onset   Depression Father    Deep vein thrombosis Father     Social History:  reports that he has never smoked. He has never been exposed to tobacco smoke. He has never used smokeless tobacco. He reports that he does not currently use alcohol. He reports that he does not use drugs.  ROS: Pertinent ROS in HPI  Physical Exam: BP (!) 142/72   Pulse 66   Ht 5\' 6"  (1.676 m)   Wt 279 lb (126.6 kg)   BMI 45.03 kg/m   Constitutional:  Well nourished. Alert and oriented, No acute distress. HEENT: [place] AT, moist mucus membranes.  Trachea midline Cardiovascular: No clubbing, cyanosis, or edema. Respiratory: Normal respiratory effort, no increased work of breathing. GU: No CVA tenderness.  No bladder fullness or masses.  Patient with uncircumcised phallus.  Foreskin easily retracted.  There is pooled urine in the foreskin and the front of his underwear is wet.   Foley in place.  There is a start of penile erosion.  No penile discharge. No penile lesions or rashes. Scrotum without lesions, cysts, rashes and/or edema.  Testicles are located scrotally bilaterally. No masses are appreciated in the testicles.  Right epididymis is normal.   Left testicle and epididymis are still indurated and tender, but I do not feel any crepitus, appreciate any erythema, there is no fluctuant mass. Rectal:  A deep sacral ulcer is appreciated.  Followed by wound care.  Neurologic: Grossly intact, no focal deficits, moving all 4  extremities. Psychiatric: Normal mood and affect.   Laboratory Data: Contains abnormal data CBC Order: [id number] Component Ref Range & Units 12 d ago  WBC 3.8 - 10.8 Thousand/uL 7.2  RBC 4.20 - 5.80 Million/uL 3.18 Low   Hemoglobin 13.2 - 17.1 g/dL 8.5 Low   Hematocrit 78.2 - 50.0 % 27.6 Low   MCV 80.0 - 100.0 fL 86.8  MCH 27.0 - 33.0 pg 26.7 Low   MCHC 32.0 - 36.0 g/dL 95.6 Low   Comment:      For adults, a slight decrease in the calculated MCHC  value (in the range of 30 to 32 g/dL) is most likely      not clinically significant; however, it should be      interpreted with caution in correlation with other      red cell parameters and the patient's clinical      condition.  RDW 11.0 - 15.0 % 16.4 High   Platelets 140 - 400 Thousand/uL 238  MPV 7.5 - 12.5 fL 11.7  Resulting Agency [facility]  Narrative Performed by [facility] FASTING:NO  FASTING: NO  Specimen Collected: 10/30/23 09:49   Performed by: Stacie Dys Last Resulted: 10/31/23 12:00  Received From: [facility]  Result Received: 11/01/23 20:17   Contains abnormal data Renal Function Panel Order: [id number] Component Ref Range & Units 12 d ago  Glucose 65 - 139 mg/dL 086  Comment:                                                                                         Non-fasting reference interval                                           BUN 7 - 25 mg/dL 38 High   Creatinine 5.78 - 1.35 mg/dL 4.69 High   eGFR CKD-EPI CR 2021 > OR = 60 mL/min/1.34m2 27 Low   BUN/Creatinine Ratio 6 - 22 (calc) 15  Sodium 135 - 146 mmol/L 134 Low   Potassium 3.5 - 5.3 mmol/L 5.8 High   Chloride 98 - 110 mmol/L 106  Bicarbonate (CO2) 20 - 32 mmol/L 20  Calcium  8.6 - 10.3 mg/dL 8.2 Low   Phosphorus 2.1 - 4.3 mg/dL 3.6  Albumin 3.6 - 5.1 g/dL 3.2 Low   Resulting Agency [facility]  Narrative Performed by [facility] FASTING:NO  FASTING:  NO  Specimen Collected: 10/30/23 09:49   Performed by: Stacie Dys Last Resulted: 10/31/23 12:00  Received From: [facility]  Result Received: 11/01/23 20:17  I have reviewed the labs.   Pertinent Imaging: N/A   Assessment & Plan:    1.  Left epididymitis and orchitis -Improving -Continue the tetracycline  -Reviewed red flag signs (fever/chills, enlargement of the scrotum, drainage from scrotum, increased pain in the scrotum and/or skin changes in scrotum)  2. Neurogenic bladder -Foley on 10/29/2023 - I irrigated the Foley easily with 50 cc of sterile water to ensure that there was no clogging of the catheter -The leaking around the catheter may be due to bladder spasms, so sent over prescription for oxybutynin IR 5 mg 3 times daily as needed for spasms  3. Penile erosion - Discussed changing his indwelling Foley to suprapubic tube and he is agreeable -They have spoken with individuals who currently have suprapubic tubes and they would like to proceed with this  Return for keep follow up on the 30th .  These notes generated with voice recognition software. I apologize for typographical errors.  Briant Camper  [facility] [address]  [address] [place], [place] [address] [phone]

## 2023-11-12 ENCOUNTER — Telehealth: Payer: Self-pay

## 2023-11-12 NOTE — Telephone Encounter (Signed)
  Phone Number: [phone] for Surgical Coordinator Fax Number: [phone]  REQUEST FOR SURGICAL CLEARANCE       Date: Date: @TODAY @  Faxed to: Dr. Ruthy Cox, MD   Surgeon: Interventional Radiologist     Date of Surgery: TBD May 2025  Operation: Suprapubic Tube Placement  Anesthesia Type: MAC   Diagnosis: Neurogenic Bladder  Patient Requires:   Medical Clearance : Yes  Reason: Will need patient to hold Eliquis  for 48 hours prior to procedure.   Risk Assessment:    Low   []       Moderate   []     High   []           This patient is optimized for surgery  YES []       NO   []    I recommend further assessment/workup prior to surgery. YES []      NO  []   Appointment scheduled for: _______________________   Further recommendations: ____________________________________     Physician Signature:__________________________________   Printed Name: ________________________________________   Date: _________________

## 2023-11-25 ENCOUNTER — Encounter: Admitting: Physician Assistant

## 2023-11-25 ENCOUNTER — Encounter: Payer: Self-pay | Admitting: Oncology

## 2023-11-25 ENCOUNTER — Inpatient Hospital Stay: Attending: Oncology | Admitting: Oncology

## 2023-11-25 ENCOUNTER — Inpatient Hospital Stay

## 2023-11-25 VITALS — BP 99/64 | HR 97 | Temp 96.6°F | Resp 16 | Ht 66.0 in | Wt 253.0 lb

## 2023-11-25 DIAGNOSIS — N189 Chronic kidney disease, unspecified: Secondary | ICD-10-CM | POA: Insufficient documentation

## 2023-11-25 DIAGNOSIS — Z86718 Personal history of other venous thrombosis and embolism: Secondary | ICD-10-CM | POA: Diagnosis not present

## 2023-11-25 DIAGNOSIS — N183 Chronic kidney disease, stage 3 unspecified: Secondary | ICD-10-CM | POA: Insufficient documentation

## 2023-11-25 DIAGNOSIS — E1122 Type 2 diabetes mellitus with diabetic chronic kidney disease: Secondary | ICD-10-CM | POA: Insufficient documentation

## 2023-11-25 DIAGNOSIS — Z7984 Long term (current) use of oral hypoglycemic drugs: Secondary | ICD-10-CM | POA: Insufficient documentation

## 2023-11-25 DIAGNOSIS — E11622 Type 2 diabetes mellitus with other skin ulcer: Secondary | ICD-10-CM | POA: Diagnosis not present

## 2023-11-25 DIAGNOSIS — E669 Obesity, unspecified: Secondary | ICD-10-CM | POA: Insufficient documentation

## 2023-11-25 DIAGNOSIS — Z7901 Long term (current) use of anticoagulants: Secondary | ICD-10-CM | POA: Diagnosis not present

## 2023-11-25 DIAGNOSIS — Z79899 Other long term (current) drug therapy: Secondary | ICD-10-CM | POA: Insufficient documentation

## 2023-11-25 DIAGNOSIS — F0283 Dementia in other diseases classified elsewhere, unspecified severity, with mood disturbance: Secondary | ICD-10-CM | POA: Insufficient documentation

## 2023-11-25 DIAGNOSIS — D631 Anemia in chronic kidney disease: Secondary | ICD-10-CM | POA: Diagnosis not present

## 2023-11-25 DIAGNOSIS — D649 Anemia, unspecified: Secondary | ICD-10-CM | POA: Insufficient documentation

## 2023-11-25 DIAGNOSIS — I130 Hypertensive heart and chronic kidney disease with heart failure and stage 1 through stage 4 chronic kidney disease, or unspecified chronic kidney disease: Secondary | ICD-10-CM | POA: Insufficient documentation

## 2023-11-25 DIAGNOSIS — I509 Heart failure, unspecified: Secondary | ICD-10-CM | POA: Insufficient documentation

## 2023-11-25 LAB — RETICULOCYTES
Immature Retic Fract: 18.7 % — ABNORMAL HIGH (ref 2.3–15.9)
RBC.: 3.2 MIL/uL — ABNORMAL LOW (ref 4.22–5.81)
Retic Count, Absolute: 40 10*3/uL (ref 19.0–186.0)
Retic Ct Pct: 1.3 % (ref 0.4–3.1)

## 2023-11-25 LAB — CBC (CANCER CENTER ONLY)
HCT: 27.6 % — ABNORMAL LOW (ref 39.0–52.0)
Hemoglobin: 8.6 g/dL — ABNORMAL LOW (ref 13.0–17.0)
MCH: 27 pg (ref 26.0–34.0)
MCHC: 31.2 g/dL (ref 30.0–36.0)
MCV: 86.5 fL (ref 80.0–100.0)
Platelet Count: 188 10*3/uL (ref 150–400)
RBC: 3.19 MIL/uL — ABNORMAL LOW (ref 4.22–5.81)
RDW: 19.5 % — ABNORMAL HIGH (ref 11.5–15.5)
WBC Count: 5 10*3/uL (ref 4.0–10.5)
nRBC: 0 % (ref 0.0–0.2)

## 2023-11-25 LAB — IRON AND TIBC
Iron: 37 ug/dL — ABNORMAL LOW (ref 45–182)
Saturation Ratios: 20 % (ref 17.9–39.5)
TIBC: 188 ug/dL — ABNORMAL LOW (ref 250–450)
UIBC: 151 ug/dL

## 2023-11-25 LAB — FOLATE: Folate: 11.7 ng/mL (ref >5.9)

## 2023-11-25 LAB — FERRITIN: Ferritin: 27 ng/mL (ref 24–336)

## 2023-11-25 LAB — LACTATE DEHYDROGENASE: LDH: 107 U/L (ref 98–192)

## 2023-11-25 LAB — VITAMIN B12: Vitamin B-12: 1200 pg/mL — ABNORMAL HIGH (ref 180–914)

## 2023-11-25 NOTE — Progress Notes (Signed)
 [facility]  [phone] [phone]  ID: Rayquon Uselman OB: September 26, 1955  MR#: [id number]  AOZ#:[id number]  Patient Care Team: Donato Fu, MD as PCP - General (Internal Medicine) Shellie Dials, MD as Consulting Physician (Oncology)  CHIEF COMPLAINT: Anemia secondary to renal insufficiency.  INTERVAL HISTORY: Patient is a 68 year old male who was noted to have persistently decreased hemoglobin and is referred for further evaluation.  He reports he previously saw hematology, but did not require any treatments.  He has chronic weakness and fatigue, but otherwise feels well.  He has no neurologic complaints.  He denies any recent fevers or illnesses.  He has a good appetite and denies weight loss.  He has no chest pain, shortness of breath, cough, or hemoptysis.  He denies any nausea, vomiting, constipation, or diarrhea.  He has no melena or hematochezia.  He has no urinary complaints.  Patient offers no further specific complaints today.  REVIEW OF SYSTEMS:   Review of Systems  Constitutional:  Positive for malaise/fatigue. Negative for fever and weight loss.  Respiratory: Negative.  Negative for cough, hemoptysis and shortness of breath.   Cardiovascular: Negative.  Negative for chest pain and leg swelling.  Gastrointestinal: Negative.  Negative for abdominal pain and blood in stool.  Genitourinary: Negative.  Negative for dysuria.  Musculoskeletal: Negative.  Negative for back pain.  Skin: Negative.  Negative for rash.  Neurological:  Positive for weakness. Negative for dizziness, focal weakness and headaches.  Psychiatric/Behavioral: Negative.  The patient is not nervous/anxious.     As per HPI. Otherwise, a complete review of systems is negative.  PAST MEDICAL HISTORY: Past Medical History:  Diagnosis Date   Acute exacerbation of CHF (congestive heart failure) (HCC)    Anemia    Bipolar disorder (HCC)    Bladder mass     Cellulitis    Chronic anticoagulation    Chronic indwelling Foley catheter    CKD (chronic kidney disease) stage 3, GFR 30-59 ml/min (HCC)    Colostomy in place (HCC)    Diabetes mellitus without complication (HCC)    Frontotemporal dementia (HCC)    Heel ulcer (HCC)    Left   Hematuria    History of blood clots    Hydronephrosis of right kidney    Hyperkalemia    Hypertension    Hyponatremia    Lithium  toxicity    Metabolic acidosis    Neurogenic bladder    Obesity    Sacral decubitus ulcer    Sepsis (HCC)    TBI (traumatic brain injury) (HCC)    UTI (urinary tract infection)    VRE (vancomycin  resistant enterococcus) culture positive     PAST SURGICAL HISTORY: Past Surgical History:  Procedure Laterality Date   BACK SURGERY     CARPAL TUNNEL RELEASE Bilateral    COLON SURGERY     COLOSTOMY     CYSTOSCOPY W/ RETROGRADES Right 10/25/2022   Procedure: CYSTOSCOPY WITH RETROGRADE PYELOGRAM;  Surgeon: Lawerence Pressman, MD;  Location: [facility] ORS;  Service: Urology;  Laterality: Right;   IR PERC TUN PERIT CATH WO PORT S&I /IMAG  10/11/2021   IR REMOVAL TUN CV CATH W/O FL  11/19/2021   IR US  GUIDE VASC ACCESS RIGHT  10/11/2021   SACRAL DECUBITUS ULCER EXCISION     TONSILLECTOMY      FAMILY HISTORY: Family History  Problem Relation Age of Onset   Depression Father    Deep vein thrombosis Father     ADVANCED  DIRECTIVES (Y/N):  N  HEALTH MAINTENANCE: Social History   Tobacco Use   Smoking status: Never    Passive exposure: Never   Smokeless tobacco: Never  Vaping Use   Vaping status: Never Used  Substance Use Topics   Alcohol use: Not Currently   Drug use: No     Colonoscopy:  PAP:  Bone density:  Lipid panel:  No Known Allergies  Current Outpatient Medications  Medication Sig Dispense Refill   amLODipine -olmesartan  (AZOR ) 5-20 MG tablet Take 1 tablet by mouth daily.     Continuous Blood Gluc Receiver (DEXCOM G7 RECEIVER) DEVI UAD for blood sugar  monitoring.     ELIQUIS  5 MG TABS tablet Take 5 mg by mouth 2 (two) times daily.      glipiZIDE  (GLUCOTROL  XL) 2.5 MG 24 hr tablet Take 2.5 mg by mouth daily.     memantine  (NAMENDA ) 10 MG tablet Take 10 mg by mouth 2 (two) times daily.     Oxcarbazepine  (TRILEPTAL ) 300 MG tablet Take 1 tablet (300 mg total) by mouth 2 (two) times daily. 180 tablet 0   oxybutynin  (DITROPAN ) 5 MG tablet Take 1 tablet (5 mg total) by mouth every 8 (eight) hours as needed for bladder spasms. 90 tablet 0   QUEtiapine  (SEROQUEL ) 25 MG tablet Take 1 tablet (25 mg total) by mouth at bedtime. May also take 1 tablet (25 mg total) daily as needed (for anxiety, agitation). 180 tablet 0   rosuvastatin  (CRESTOR ) 20 MG tablet Take 20 mg by mouth at bedtime.     vitamin B-12 (CYANOCOBALAMIN ) 1000 MCG tablet Take 1,000 mcg by mouth daily.     No current facility-administered medications for this visit.    OBJECTIVE: Vitals:   11/25/23 1324  BP: 99/64  Pulse: 97  Resp: 16  Temp: (!) 96.6 F (35.9 C)     Body mass index is 40.84 kg/m.    ECOG FS:1 - Symptomatic but completely ambulatory  General: Well-developed, well-nourished, no acute distress.  Sitting in a wheelchair. Eyes: Pink conjunctiva, anicteric sclera. HEENT: Normocephalic, moist mucous membranes. Lungs: No audible wheezing or coughing. Heart: Regular rate and rhythm. Abdomen: Soft, nontender, no obvious distention. Musculoskeletal: No edema, cyanosis, or clubbing. Neuro: Alert, answering all questions appropriately. Cranial nerves grossly intact. Skin: No rashes or petechiae noted. Psych: Normal affect. Lymphatics: No cervical, calvicular, axillary or inguinal LAD.   LAB RESULTS:  Lab Results  Component Value Date   NA 138 09/21/2023   K 4.7 09/21/2023   CL 112 (H) 09/21/2023   CO2 20 (L) 09/21/2023   GLUCOSE 139 (H) 09/21/2023   BUN 41 (H) 09/21/2023   CREATININE 2.32 (H) 09/21/2023   CALCIUM  8.9 09/21/2023   PROT 7.9 09/16/2023   ALBUMIN  3.0 (L) 09/16/2023   AST 22 09/16/2023   ALT 19 09/16/2023   ALKPHOS 69 09/16/2023   BILITOT 0.6 09/16/2023   GFRNONAA 30 (L) 09/21/2023   GFRAA >60 03/22/2020    Lab Results  Component Value Date   WBC 5.0 11/25/2023   NEUTROABS 4.1 09/21/2023   HGB 8.6 (L) 11/25/2023   HCT 27.6 (L) 11/25/2023   MCV 86.5 11/25/2023   PLT 188 11/25/2023     STUDIES: No results found.  ASSESSMENT: Anemia secondary to renal insufficiency  PLAN:    Anemia secondary to renal insufficiency: Patient's hemoglobin remains persistently decreased and is 8.6 today.  All of his other laboratory work including iron  panel is pending at time of dictation.  Patient  does not require bone marrow biopsy.  Return to clinic in 1 week for 40,000 units Retacrit.  Patient will then return to clinic in 1, 2, 3 months for laboratory work and continuation of Retacrit if his hemoglobin remains below 10.0.  Patient will then return to clinic in 4 months with repeat laboratory work, further evaluation, and continuation of treatment if needed. Chronic renal insufficiency: Patient's most recent GFR was 30.  Continue follow-up with nephrology.  I spent a total of 45 minutes reviewing chart data, face-to-face evaluation with the patient, counseling and coordination of care as detailed above.   Patient expressed understanding and was in agreement with this plan. He also understands that He can call clinic at any time with any questions, concerns, or complaints.     Shellie Dials, MD   11/25/2023 2:34 PM

## 2023-11-26 LAB — HAPTOGLOBIN: Haptoglobin: 177 mg/dL (ref 32–363)

## 2023-11-26 LAB — ERYTHROPOIETIN: Erythropoietin: 32.3 m[IU]/mL — ABNORMAL HIGH (ref 2.6–18.5)

## 2023-11-28 ENCOUNTER — Ambulatory Visit: Admitting: Urology

## 2023-11-28 DIAGNOSIS — R339 Retention of urine, unspecified: Secondary | ICD-10-CM

## 2023-11-28 DIAGNOSIS — N319 Neuromuscular dysfunction of bladder, unspecified: Secondary | ICD-10-CM

## 2023-11-28 NOTE — Progress Notes (Signed)
 Cath Change/ Replacement  Patient is present today for a catheter change due to urinary retention.  8 ml of water was removed from the balloon, a 16 FR foley cath was removed without difficulty.  Patient was cleaned and prepped in a sterile fashion with betadine and 2% lidocaine  jelly was instilled into the urethra. A 16 FR foley cath was replaced into the bladder, no complications were noted. Urine return was noted 20 ml and urine was yellow in color. The balloon was filled with 10ml of sterile water. A night bag was attached for drainage.  Patient was given proper instruction on catheter care.    Scrotum was rechecked and there was no further tenderness, induration, fluctuance, crepitus or erythema.  There was some small scattered areas of skin breakdown secondary to depend use.   We will continue to monitor.    Performed by: Matilde Son, PA-C   Follow up: Return in about 1 month (around 12/29/2023) for Foley catheter exchange .

## 2023-12-01 ENCOUNTER — Inpatient Hospital Stay: Attending: Oncology

## 2023-12-01 VITALS — BP 155/79

## 2023-12-01 DIAGNOSIS — I130 Hypertensive heart and chronic kidney disease with heart failure and stage 1 through stage 4 chronic kidney disease, or unspecified chronic kidney disease: Secondary | ICD-10-CM | POA: Diagnosis present

## 2023-12-01 DIAGNOSIS — D631 Anemia in chronic kidney disease: Secondary | ICD-10-CM

## 2023-12-01 DIAGNOSIS — Z7984 Long term (current) use of oral hypoglycemic drugs: Secondary | ICD-10-CM | POA: Diagnosis not present

## 2023-12-01 DIAGNOSIS — Z86718 Personal history of other venous thrombosis and embolism: Secondary | ICD-10-CM | POA: Diagnosis not present

## 2023-12-01 DIAGNOSIS — Z79899 Other long term (current) drug therapy: Secondary | ICD-10-CM | POA: Insufficient documentation

## 2023-12-01 DIAGNOSIS — D649 Anemia, unspecified: Secondary | ICD-10-CM | POA: Insufficient documentation

## 2023-12-01 DIAGNOSIS — N183 Chronic kidney disease, stage 3 unspecified: Secondary | ICD-10-CM | POA: Insufficient documentation

## 2023-12-01 DIAGNOSIS — Z7901 Long term (current) use of anticoagulants: Secondary | ICD-10-CM | POA: Diagnosis not present

## 2023-12-01 DIAGNOSIS — E1122 Type 2 diabetes mellitus with diabetic chronic kidney disease: Secondary | ICD-10-CM | POA: Diagnosis not present

## 2023-12-01 MED ORDER — EPOETIN ALFA-EPBX 40000 UNIT/ML IJ SOLN
40000.0000 [IU] | Freq: Once | INTRAMUSCULAR | Status: AC
  Administered 2023-12-01: 40000 [IU] via SUBCUTANEOUS
  Filled 2023-12-01: qty 1

## 2023-12-09 ENCOUNTER — Other Ambulatory Visit: Payer: Self-pay | Admitting: Radiology

## 2023-12-09 DIAGNOSIS — R339 Retention of urine, unspecified: Secondary | ICD-10-CM

## 2023-12-11 NOTE — Progress Notes (Signed)
 Patient for IR SPT Placement on Fri 12/12/23, I called and spoke with the patient's wife,[name] on the phone and gave pre-procedure instructions. [name] was made aware to have the patient here at 7:30a, NPO after MN prior to procedure as well as driver post procedure/recovery/discharge. [name] stated understanding.  Called 12/11/23

## 2023-12-11 NOTE — H&P (Signed)
 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 Zettie Hillock  Patient Status: [facility] - Out-pt  History of Present Illness: [name] is a 68 y.o. male with a history of neurogenic bladder secondary to a traumatic brain injury in the early 2000s. He has been following with Urology for several years and managing his urinary retention with chronic foley use. At his last visit with Urology, his wife reported urine leaking around the catheter site. On further exam, the foley was functional and he was noted to have penile erosion. They subsequently discussed transitioning him to a suprapubic catheter which he and his wife are both in agreement with. Patient referred to IR for suprapubic catheter placement.   Patient is currently resting in bed with wife at the bedside. He is currently without complaints. VSS. INR 1.2. NPO since midnight. Dr. Burna Carrier thoroughly explained the procedure to patient and wife. All questions and concerns answered at the bedside.   Patient is Full Code  Past Medical History:  Diagnosis Date   Acute exacerbation of CHF (congestive heart failure) (HCC)    Anemia    Bipolar disorder (HCC)    Bladder mass    Cellulitis    Chronic anticoagulation    Chronic indwelling Foley catheter    CKD (chronic kidney disease) stage 3, GFR 30-59 ml/min (HCC)    Colostomy in place (HCC)    Diabetes mellitus without complication (HCC)    Frontotemporal dementia (HCC)    Heel ulcer (HCC)    Left   Hematuria    History of blood clots    Hydronephrosis of right kidney    Hyperkalemia    Hypertension    Hyponatremia    Lithium  toxicity    Metabolic acidosis    Neurogenic bladder    Obesity    Sacral decubitus ulcer    Sepsis (HCC)    TBI (traumatic brain injury) (HCC)    UTI (urinary tract infection)    VRE (vancomycin  resistant enterococcus)  culture positive     Past Surgical History:  Procedure Laterality Date   BACK SURGERY     CARPAL TUNNEL RELEASE Bilateral    COLON SURGERY     COLOSTOMY     CYSTOSCOPY W/ RETROGRADES Right 10/25/2022   Procedure: CYSTOSCOPY WITH RETROGRADE PYELOGRAM;  Surgeon: Lawerence Pressman, MD;  Location: [facility] ORS;  Service: Urology;  Laterality: Right;   IR PERC TUN PERIT CATH WO PORT S&I /IMAG  10/11/2021   IR REMOVAL TUN CV CATH W/O FL  11/19/2021   IR US  GUIDE VASC ACCESS RIGHT  10/11/2021   SACRAL DECUBITUS ULCER EXCISION     TONSILLECTOMY      Allergies: Patient has no known allergies.  Medications: Prior to Admission medications   Medication Sig Start Date End Date Taking? Authorizing Provider  amLODipine -olmesartan  (AZOR ) 5-20 MG tablet Take 1 tablet by mouth daily.    [provider]  Continuous Blood Gluc Receiver (DEXCOM G7 RECEIVER) DEVI UAD for blood sugar monitoring. 01/23/22   [provider]  ELIQUIS  5 MG TABS tablet Take 5 mg by mouth 2 (two) times daily.  11/17/18   [provider]  glipiZIDE  (GLUCOTROL  XL) 2.5 MG 24 hr tablet Take 2.5 mg by mouth daily. 02/04/23   [provider]  memantine  (NAMENDA ) 10 MG tablet Take 10 mg by mouth 2 (two) times daily.    [provider]  Oxcarbazepine  (TRILEPTAL ) 300 MG tablet Take 1 tablet (300 mg total) by mouth 2 (two) times daily. 10/07/23   Eappen, Saramma, MD  oxybutynin  (DITROPAN ) 5 MG tablet Take 1 tablet (5 mg total) by mouth every 8 (eight) hours as needed for bladder spasms. 11/11/23   Matilde Son A, PA-C  QUEtiapine  (SEROQUEL ) 25 MG tablet Take 1 tablet (25 mg total) by mouth at bedtime. May also take 1 tablet (25 mg total) daily as needed (for anxiety, agitation). 10/07/23 01/05/24  Eappen, Saramma, MD  rosuvastatin  (CRESTOR ) 20 MG tablet Take 20 mg by mouth at bedtime. 08/29/17   [provider]  vitamin B-12 (CYANOCOBALAMIN ) 1000 MCG tablet Take 1,000 mcg by mouth daily.    [provider]     Family History  Problem Relation Age of Onset   Depression Father    Deep vein thrombosis Father     Social History   Socioeconomic History   Marital status: Married    Spouse name: thelma   Number of children: 0   Years of education: Not on file   Highest education level: Associate degree: occupational, Scientist, product/process development, or vocational program  Occupational History   Not on file  Tobacco Use   Smoking status: Never    Passive exposure: Never   Smokeless tobacco: Never  Vaping Use   Vaping status: Never Used  Substance and Sexual Activity   Alcohol use: Not Currently   Drug use: No   Sexual activity: Not Currently  Other Topics Concern   Not on file  Social History Narrative   Not on file   Social Drivers of Health   Financial Resource Strain: Low Risk  (12/31/2020)   Received from [facility]   Overall Financial Resource Strain (CARDIA)    Difficulty of Paying Living Expenses: Not very hard  Food Insecurity: Patient Declined (09/16/2023)   Hunger Vital Sign    Worried About Running Out of Food in the Last Year: Patient declined    Ran Out of Food in the Last Year: Patient declined  Transportation Needs: Patient Declined (09/16/2023)   PRAPARE - Administrator, Civil Service (Medical): Patient declined    Lack of Transportation (Non-Medical): Patient declined  Physical Activity: Inactive (09/09/2017)   Exercise Vital Sign    Days of Exercise per Week: 0 days    Minutes of Exercise per Session: 0 min  Stress: No Stress Concern Present (09/09/2017)   [employer] of Occupational Health - Occupational Stress Questionnaire    Feeling of Stress : Not at all  Social Connections: Unknown (09/17/2023)   Social Connection and Isolation Panel    Frequency of Communication with Friends and Family: Patient declined    Frequency of Social Gatherings with Friends and Family: Patient declined    Attends Religious Services: Patient declined     Database administrator or Organizations: Patient declined    Attends Banker Meetings: Patient declined    Marital Status: Married     Review of Systems Patient denies any headache, chest pain, shortness of breath, abdominal pain, N/V, or fever/chills. All other systems are negative.   Vital Signs: BP (!) 185/86   Pulse 82   Temp 97.9 F (36.6 C) (Oral)   Resp 15   Ht 5' 6 (1.676 m)   Wt 279 lb (126.6 kg)   SpO2 100%   BMI 45.03 kg/m    Physical Exam Vitals reviewed.  Constitutional:      Appearance:  Normal appearance.  HENT:     Head: Normocephalic and atraumatic.     Mouth/Throat:     Mouth: Mucous membranes are moist.     Pharynx: Oropharynx is clear.   Cardiovascular:     Rate and Rhythm: Normal rate and regular rhythm.  Pulmonary:     Effort: Pulmonary effort is normal.     Breath sounds: Normal breath sounds.  Abdominal:     General: Abdomen is flat.     Palpations: Abdomen is soft.     Tenderness: There is no abdominal tenderness.     Comments: Colostomy bag in place  Genitourinary:    Comments: Foley in place and clamped  Musculoskeletal:        General: Normal range of motion.     Cervical back: Normal range of motion.   Skin:    General: Skin is warm and dry.   Neurological:     General: No focal deficit present.     Mental Status: He is alert and oriented to person, place, and time. Mental status is at baseline.   Psychiatric:        Mood and Affect: Mood normal.        Behavior: Behavior normal.        Judgment: Judgment normal.     Imaging: No results found.  Labs:  CBC: Recent Labs    09/21/23 0244 09/21/23 1400 11/25/23 1401 12/12/23 0755  WBC 5.4 6.0 5.0 5.5  HGB 8.6* 9.3* 8.6* 9.1*  HCT 27.7* 29.8* 27.6* 30.9*  PLT 185 187 188 201    COAGS: Recent Labs    02/27/23 0006 04/11/23 2206 09/16/23 1241 12/12/23 0755  INR 1.4* 1.5* 1.5* 1.2    BMP: Recent Labs    09/19/23 0403 09/20/23 0539  09/20/23 1300 09/21/23 0244 09/21/23 1400  NA 137 137  --  134* 138  K 5.0 5.3* 5.2* 4.7 4.7  CL 114* 114*  --  112* 112*  CO2 18* 18*  --  18* 20*  GLUCOSE 123* 129*  --  131* 139*  BUN 37* 35*  --  42* 41*  CALCIUM  8.2* 8.5*  --  8.1* 8.9  CREATININE 2.09* 2.13*  --  2.30* 2.32*  GFRNONAA 34* 33*  --  30* 30*    LIVER FUNCTION TESTS: Recent Labs    02/28/23 0304 04/11/23 2206 04/12/23 0344 09/16/23 1123  BILITOT 0.5 1.0 0.7 0.6  AST 22 29 20 22   ALT 14 22 16 19   ALKPHOS 90 93 77 69  PROT 7.2 9.5* 7.8 7.9  ALBUMIN 2.8* 3.6 2.9* 3.0*    TUMOR MARKERS: No results for input(s): AFPTM, CEA, CA199, CHROMGRNA in the last 8760 hours.  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 male with a history of neurogenic bladder secondary to TBI in the early 2000s managed with chronic foley use who presents to [facility] Interventional Radiology department for an image-guided suprapubic catheter placement with Dr. Zettie Hillock on 12/12/23. Procedure to be performed under moderate sedation.  -NPO since midnight -VSS. INR 1.2 -Eliquis  held since Tuesday 6/10 -Plan for suprapubic catheter placement with Dr. Zettie Hillock  Risks and benefits suprapubic catheter placement discussed with the patient including bleeding, infection, damage to adjacent structures, bowel perforation/fistula connection, and sepsis.  All of the patient's questions were answered, patient is agreeable to proceed. Consent signed and in chart.   Thank you for allowing our service to participate in [facility] 's care.    Electronically Signed: Edmund Holcomb M Kharter Brew, PA-C   12/12/2023, 8:25 AM     I spent a total of 30 Minutes in face to face in clinical consultation, greater than 50% of which was counseling/coordinating care for suprapubic catheter placement.

## 2023-12-12 ENCOUNTER — Encounter: Payer: Self-pay | Admitting: Radiology

## 2023-12-12 ENCOUNTER — Ambulatory Visit
Admission: RE | Admit: 2023-12-12 | Discharge: 2023-12-12 | Disposition: A | Discharge status: Home / Self Care | Source: Ambulatory Visit | Attending: Urology | Admitting: Urology

## 2023-12-12 ENCOUNTER — Other Ambulatory Visit: Payer: Self-pay

## 2023-12-12 DIAGNOSIS — Z7901 Long term (current) use of anticoagulants: Secondary | ICD-10-CM | POA: Diagnosis not present

## 2023-12-12 DIAGNOSIS — R339 Retention of urine, unspecified: Secondary | ICD-10-CM

## 2023-12-12 DIAGNOSIS — N319 Neuromuscular dysfunction of bladder, unspecified: Secondary | ICD-10-CM | POA: Diagnosis present

## 2023-12-12 LAB — CBC
HCT: 30.9 % — ABNORMAL LOW (ref 39.0–52.0)
Hemoglobin: 9.1 g/dL — ABNORMAL LOW (ref 13.0–17.0)
MCH: 26.2 pg (ref 26.0–34.0)
MCHC: 29.4 g/dL — ABNORMAL LOW (ref 30.0–36.0)
MCV: 89 fL (ref 80.0–100.0)
Platelets: 201 10*3/uL (ref 150–400)
RBC: 3.47 MIL/uL — ABNORMAL LOW (ref 4.22–5.81)
RDW: 20.1 % — ABNORMAL HIGH (ref 11.5–15.5)
WBC: 5.5 10*3/uL (ref 4.0–10.5)
nRBC: 0 % (ref 0.0–0.2)

## 2023-12-12 LAB — GLUCOSE, CAPILLARY: Glucose-Capillary: 79 mg/dL (ref 70–99)

## 2023-12-12 LAB — PROTIME-INR
INR: 1.2 (ref 0.8–1.2)
Prothrombin Time: 15 s (ref 11.4–15.2)

## 2023-12-12 MED ORDER — MIDAZOLAM HCL 2 MG/2ML IJ SOLN
INTRAMUSCULAR | Status: AC
  Filled 2023-12-12: qty 2

## 2023-12-12 MED ORDER — IOHEXOL 300 MG/ML  SOLN
25.0000 mL | Freq: Once | INTRAMUSCULAR | Status: AC | PRN
  Administered 2023-12-12: 25 mL

## 2023-12-12 MED ORDER — FENTANYL CITRATE (PF) 100 MCG/2ML IJ SOLN
INTRAMUSCULAR | Status: AC
  Filled 2023-12-12: qty 2

## 2023-12-12 MED ORDER — LIDOCAINE HCL 1 % IJ SOLN
10.0000 mL | Freq: Once | INTRAMUSCULAR | Status: AC
  Administered 2023-12-12: 10 mL via INTRADERMAL

## 2023-12-12 MED ORDER — FENTANYL CITRATE (PF) 100 MCG/2ML IJ SOLN
INTRAMUSCULAR | Status: DC | PRN
  Administered 2023-12-12: 25 ug via INTRAVENOUS
  Administered 2023-12-12: 50 ug via INTRAVENOUS

## 2023-12-12 MED ORDER — SODIUM CHLORIDE 0.9 % IV SOLN: INTRAVENOUS | Status: DC

## 2023-12-12 MED ORDER — MIDAZOLAM HCL 5 MG/5ML IJ SOLN
INTRAMUSCULAR | Status: DC | PRN
  Administered 2023-12-12: 1 mg via INTRAVENOUS

## 2023-12-12 MED ORDER — LIDOCAINE HCL 1 % IJ SOLN
INTRAMUSCULAR | Status: AC
  Filled 2023-12-12: qty 20

## 2023-12-12 NOTE — Progress Notes (Signed)
 Patient clinically stable post IR SPT 16 Fr placement per Dr Burna Carrier, tolerated well. Vitals stable pre and post procedure. Denies complaints post procedure. Received Versed 1 mg along with Fentanyl  75 mcg IV for procedure. Report given to Paige Johnson Rn post procedure/specials/15

## 2023-12-12 NOTE — Discharge Instructions (Signed)
 Suprapubic Catheter Home Guide A suprapubic catheter is a soft tube that is used to drain pee (urine) from the bladder into a collection bag outside the body. The catheter is inserted into the bladder through a small opening in the lower abdomen, above the pubic bone (suprapubic area) and a few inches below your belly button. A tiny balloon filled with germ-free (sterile) water helps to keep the catheter in place. The collection bag must be emptied at least once a day. The collection bag can be put beside your bed at night and attached to your leg during the day. You may have a large collection bag to use at night and a smaller one to use during the day. Your suprapubic catheter may need to be changed every 4-6 weeks, or as told by your health care provider. Healing of the tract where the catheter is placed can take 6 weeks to 6 months. During that time, your provider may change your catheter. Once the tract is well healed, you or a caregiver will change your suprapubic catheter at home. What are the risks? Your provider will talk with you about risks. These may include: Blocked pee flow. This can occur if the catheter stops working, or if you have a blood clot in your bladder or in the catheter. Irritation of the skin around the catheter. Infection. How to care for the skin around the catheter Follow your provider's instructions on how to care for your skin. Use a clean washcloth and soapy water to clean the skin around your catheter every day. Pat the area dry with a clean paper towel. Do not pull on the catheter. Do not use ointment or lotion on this area, unless told by your provider. Check the skin around the catheter every day for signs of infection. Check for: Redness, swelling, or pain. Fluid or blood. Warmth. Pus or a bad smell. How to empty and clean the collection bag Empty the large collection bag every 8 hours. Empty the small collection bag when it is about ? full. Clean the  collection bag every 2-3 days, or as often as told by your provider. To do this: Wash your hands with soap and water for at least 20 seconds. If soap and water are not available, use hand sanitizer. Disconnect the bag from the catheter and immediately attach a new bag to the catheter. Hold the used bag over the toilet or another container. Turn the valve (spigot) at the bottom of the bag to empty the pee. Empty the used bag completely. Do not touch the opening of the spigot. Do not let the opening touch the toilet or container. Close the spigot tightly when the bag is empty. Clean the used bag in one of the following guides: According to the manufacturer's instructions. As told by your provider. Let the bag dry completely. Put it in a clean plastic bag before storing it. General tips Always wash your hands for at least 20 seconds before and after caring for your catheter and collection bag. Use a mild, fragrance-free soap. If soap and water are not available, use hand sanitizer. Clean the outside of the catheter with soap and water as often as told by your provider. Always make sure there are no twists or kinks in the catheter tube. Always make sure there are no leaks in the catheter or collection bag. Always wear the leg bag below your knee. Make sure the overnight drainage bag is always lower than the level of your bladder. Do  not let it touch the floor. Before you go to sleep, hang the bag inside a wastebasket that is covered by a clean plastic bag. Drink enough fluid to keep your pee pale yellow. Do not take baths, swim, or use a hot tub until your provider approves. Ask your provider if you may take showers. Contact a heath care provider if: You have any signs of infection around your catheter. You have a fever or chills. There is a change in the color or smell of your pee. You have vomiting that does not stop. You have back pain. You have blood in your pee. Also, seek care if: You  have a hard time changing your catheter. You leak pee around your catheter. Your pee flow slows down. Get help right away if: Your catheter comes out and you are unable to insert a new one. You have no pee flow for 1 hour. This information is not intended to replace advice given to you by your health care provider. Make sure you discuss any questions you have with your health care pr

## 2023-12-12 NOTE — Procedures (Signed)
 Pre procedural Dx: Neurogenic bladder Post procedural Dx: Same  Technically successful SPC placement     F/U with Urology dept for routine exchanges   EBL: Trace Complications: None immediate  Zettie Hillock, MD Pager #: [phone]

## 2023-12-15 ENCOUNTER — Telehealth: Payer: Self-pay | Admitting: Radiology

## 2023-12-15 NOTE — Telephone Encounter (Signed)
 Was notified from Urology that patient's wife had called their clinic with concerns of urine draining out of urethra instead of into the suprapubic catheter. I called the patient's wife, [name], who stated that when they woke up this morning, the patient's sheets were covered in urine and there was no urine in the SPT bag. She denies any issues with the bag over the past few days and states that this is the first time any urine has come out of his urethra. Patient denies any hematuria, fevers/chills, or pain at the insertion site. Discussed with the them that it is possible for some urine to continue draining out of his urethra as well as into the bag and that hopefully, with time, this will become less frequent. Wife verbalized understanding. While we were on the phone she informed me that urine was again draining into the SPT bag. Recommended that patient and wife continue to monitor SPT output over the next several days and should there be no urine draining into the SPT bag to call the IR clinic and we can schedule them for an injection under fluoroscopy to assess for appropriate position. Wife verbalized understanding and is agreeable with this plan. Urology team informed.   Please reach out to the IR team for any additional questions/concerns.   Electronically Signed: Chayse Zatarain M Caralee Morea, PA-C 12/15/2023, 12:16 PM

## 2023-12-17 ENCOUNTER — Encounter: Attending: Internal Medicine | Admitting: Internal Medicine

## 2023-12-17 DIAGNOSIS — L89154 Pressure ulcer of sacral region, stage 4: Secondary | ICD-10-CM | POA: Diagnosis not present

## 2023-12-17 DIAGNOSIS — I10 Essential (primary) hypertension: Secondary | ICD-10-CM | POA: Diagnosis not present

## 2023-12-17 DIAGNOSIS — Z87820 Personal history of traumatic brain injury: Secondary | ICD-10-CM | POA: Diagnosis not present

## 2023-12-17 DIAGNOSIS — F319 Bipolar disorder, unspecified: Secondary | ICD-10-CM | POA: Insufficient documentation

## 2023-12-17 DIAGNOSIS — L97822 Non-pressure chronic ulcer of other part of left lower leg with fat layer exposed: Secondary | ICD-10-CM | POA: Insufficient documentation

## 2023-12-17 DIAGNOSIS — M6281 Muscle weakness (generalized): Secondary | ICD-10-CM | POA: Insufficient documentation

## 2023-12-17 DIAGNOSIS — M8668 Other chronic osteomyelitis, other site: Secondary | ICD-10-CM | POA: Insufficient documentation

## 2023-12-17 DIAGNOSIS — Z7901 Long term (current) use of anticoagulants: Secondary | ICD-10-CM | POA: Insufficient documentation

## 2023-12-17 DIAGNOSIS — E11622 Type 2 diabetes mellitus with other skin ulcer: Secondary | ICD-10-CM | POA: Diagnosis present

## 2023-12-25 ENCOUNTER — Other Ambulatory Visit: Payer: Self-pay

## 2023-12-25 ENCOUNTER — Ambulatory Visit (INDEPENDENT_AMBULATORY_CARE_PROVIDER_SITE_OTHER): Admitting: Urology

## 2023-12-25 VITALS — BP 160/78 | HR 67 | Ht 69.0 in | Wt 243.0 lb

## 2023-12-25 DIAGNOSIS — D631 Anemia in chronic kidney disease: Secondary | ICD-10-CM

## 2023-12-25 DIAGNOSIS — N319 Neuromuscular dysfunction of bladder, unspecified: Secondary | ICD-10-CM | POA: Diagnosis not present

## 2023-12-25 NOTE — Progress Notes (Signed)
 Suprapubic Cath Change  Patient is present today for a suprapubic catheter change due to urinary retention.  9 ml of water was drained from the balloon, a 18 FR Council tip foley cath was removed from the tract with out difficulty.  Site was cleaned and prepped in a sterile fashion with betadine.  A 16 FR foley cath was replaced into the tract no complications were noted. Urine return was noted, 10 ml of sterile water was inflated into the balloon and a night bag was attached for drainage.  Patient tolerated well.    Performed by: CLOTILDA CORNWALL, PA-C and Powell Sink, CMA   Follow up: Return in about 1 month (around 01/24/2024) for SPT exchange .

## 2023-12-26 ENCOUNTER — Inpatient Hospital Stay

## 2023-12-26 VITALS — BP 134/76

## 2023-12-26 DIAGNOSIS — N189 Chronic kidney disease, unspecified: Secondary | ICD-10-CM

## 2023-12-26 DIAGNOSIS — I130 Hypertensive heart and chronic kidney disease with heart failure and stage 1 through stage 4 chronic kidney disease, or unspecified chronic kidney disease: Secondary | ICD-10-CM | POA: Diagnosis not present

## 2023-12-26 LAB — CMP (CANCER CENTER ONLY)
ALT: 17 U/L (ref 0–44)
AST: 19 U/L (ref 15–41)
Albumin: 3.2 g/dL — ABNORMAL LOW (ref 3.5–5.0)
Alkaline Phosphatase: 84 U/L (ref 38–126)
Anion gap: 7 (ref 5–15)
BUN: 49 mg/dL — ABNORMAL HIGH (ref 8–23)
CO2: 17 mmol/L — ABNORMAL LOW (ref 22–32)
Calcium: 8.2 mg/dL — ABNORMAL LOW (ref 8.9–10.3)
Chloride: 108 mmol/L (ref 98–111)
Creatinine: 2.44 mg/dL — ABNORMAL HIGH (ref 0.61–1.24)
GFR, Estimated: 28 mL/min — ABNORMAL LOW (ref >60)
Glucose, Bld: 102 mg/dL — ABNORMAL HIGH (ref 70–99)
Potassium: 5 mmol/L (ref 3.5–5.1)
Sodium: 132 mmol/L — ABNORMAL LOW (ref 135–145)
Total Bilirubin: 0.6 mg/dL (ref 0.0–1.2)
Total Protein: 8.5 g/dL — ABNORMAL HIGH (ref 6.5–8.1)

## 2023-12-26 LAB — CBC WITH DIFFERENTIAL (CANCER CENTER ONLY)
Abs Immature Granulocytes: 0.01 10*3/uL (ref 0.00–0.07)
Basophils Absolute: 0.1 10*3/uL (ref 0.0–0.1)
Basophils Relative: 1 %
Eosinophils Absolute: 0.4 10*3/uL (ref 0.0–0.5)
Eosinophils Relative: 7 %
HCT: 31.4 % — ABNORMAL LOW (ref 39.0–52.0)
Hemoglobin: 9.5 g/dL — ABNORMAL LOW (ref 13.0–17.0)
Immature Granulocytes: 0 %
Lymphocytes Relative: 19 %
Lymphs Abs: 1.3 10*3/uL (ref 0.7–4.0)
MCH: 26.4 pg (ref 26.0–34.0)
MCHC: 30.3 g/dL (ref 30.0–36.0)
MCV: 87.2 fL (ref 80.0–100.0)
Monocytes Absolute: 0.5 10*3/uL (ref 0.1–1.0)
Monocytes Relative: 7 %
Neutro Abs: 4.5 10*3/uL (ref 1.7–7.7)
Neutrophils Relative %: 66 %
Platelet Count: 238 10*3/uL (ref 150–400)
RBC: 3.6 MIL/uL — ABNORMAL LOW (ref 4.22–5.81)
RDW: 18.5 % — ABNORMAL HIGH (ref 11.5–15.5)
WBC Count: 6.7 10*3/uL (ref 4.0–10.5)
nRBC: 0 % (ref 0.0–0.2)

## 2023-12-26 MED ORDER — EPOETIN ALFA-EPBX 40000 UNIT/ML IJ SOLN
40000.0000 [IU] | Freq: Once | INTRAMUSCULAR | Status: AC
  Administered 2023-12-26: 40000 [IU] via SUBCUTANEOUS
  Filled 2023-12-26: qty 1

## 2024-01-06 ENCOUNTER — Encounter: Attending: Physician Assistant | Admitting: Physician Assistant

## 2024-01-06 DIAGNOSIS — E1169 Type 2 diabetes mellitus with other specified complication: Secondary | ICD-10-CM | POA: Insufficient documentation

## 2024-01-06 DIAGNOSIS — Z87820 Personal history of traumatic brain injury: Secondary | ICD-10-CM | POA: Insufficient documentation

## 2024-01-06 DIAGNOSIS — M6281 Muscle weakness (generalized): Secondary | ICD-10-CM | POA: Diagnosis not present

## 2024-01-06 DIAGNOSIS — L97822 Non-pressure chronic ulcer of other part of left lower leg with fat layer exposed: Secondary | ICD-10-CM | POA: Insufficient documentation

## 2024-01-06 DIAGNOSIS — Z7901 Long term (current) use of anticoagulants: Secondary | ICD-10-CM | POA: Insufficient documentation

## 2024-01-06 DIAGNOSIS — F319 Bipolar disorder, unspecified: Secondary | ICD-10-CM | POA: Diagnosis not present

## 2024-01-06 DIAGNOSIS — M8668 Other chronic osteomyelitis, other site: Secondary | ICD-10-CM | POA: Insufficient documentation

## 2024-01-06 DIAGNOSIS — E11622 Type 2 diabetes mellitus with other skin ulcer: Secondary | ICD-10-CM | POA: Insufficient documentation

## 2024-01-06 DIAGNOSIS — I10 Essential (primary) hypertension: Secondary | ICD-10-CM | POA: Insufficient documentation

## 2024-01-06 DIAGNOSIS — L89154 Pressure ulcer of sacral region, stage 4: Secondary | ICD-10-CM | POA: Insufficient documentation

## 2024-01-12 ENCOUNTER — Telehealth: Payer: Self-pay

## 2024-01-12 DIAGNOSIS — G4701 Insomnia due to medical condition: Secondary | ICD-10-CM

## 2024-01-12 DIAGNOSIS — F411 Generalized anxiety disorder: Secondary | ICD-10-CM

## 2024-01-12 DIAGNOSIS — F3181 Bipolar II disorder: Secondary | ICD-10-CM

## 2024-01-12 NOTE — Telephone Encounter (Signed)
 Patients spouse called to request a refill of Oxcarbazepine  (TRILEPTAL ) 300 MG tablet    Last visit 10-07-23 Next visit 03-15-24    Preferred [facility] [address] [place], [place] - [address] Phone: [phone]  Fax: [phone]

## 2024-01-13 MED ORDER — OXCARBAZEPINE 300 MG PO TABS: 300.0000 mg | ORAL_TABLET | Freq: Two times a day (BID) | ORAL | 0 refills | Status: DC

## 2024-01-13 NOTE — Telephone Encounter (Signed)
 Spoke to patients spouse inform her of the patients sodium levels and advise to have them checked again she voiced understanding

## 2024-01-13 NOTE — Telephone Encounter (Signed)
 I have sent oxcarbazepine  to pharmacy as requested.  Most recent sodium level low at 132.  Will need to get this repeated since the medication could also cause hyponatremia.

## 2024-01-13 NOTE — Addendum Note (Signed)
 Addended byBETHA COBY HEIGHT on: 01/13/2024 03:33 PM   Modules accepted: Orders

## 2024-01-14 ENCOUNTER — Telehealth: Payer: Self-pay

## 2024-01-14 NOTE — Telephone Encounter (Signed)
 Pt's wife called the triage line stating urine was coming out of the pt's penis, pt has a SPT cath, states they had stopped Oxybutynin . Pt's wife was advise to continue taking oxybutynin  for the pt's bladder spasms. Pt's wife voiced understanding.

## 2024-01-20 NOTE — Progress Notes (Deleted)
 Suprapubic Cath Change Patient is present today for a suprapubic catheter change due to urinary retention.  9 ml of water was drained from the balloon, a 16 FR foley cath was removed from the tract with out difficulty.  Site was cleaned and prepped in a sterile fashion with betadine.  A 16 FR foley cath was replaced into the tract no complications were noted. Urine return was noted, 10 ml of sterile water was inflated into the balloon and a night bag was attached for drainage.  Patient tolerated well. A night bag was given to patient and proper instruction was given on how to switch bags.    Performed by: ***  Follow up: Return in 1 month for suprapubic tube exchange

## 2024-01-22 ENCOUNTER — Ambulatory Visit: Admitting: Urology

## 2024-01-22 DIAGNOSIS — N319 Neuromuscular dysfunction of bladder, unspecified: Secondary | ICD-10-CM

## 2024-01-23 ENCOUNTER — Other Ambulatory Visit: Payer: Self-pay | Admitting: *Deleted

## 2024-01-23 DIAGNOSIS — D631 Anemia in chronic kidney disease: Secondary | ICD-10-CM

## 2024-01-26 ENCOUNTER — Inpatient Hospital Stay

## 2024-01-26 ENCOUNTER — Ambulatory Visit: Admitting: Physician Assistant

## 2024-01-26 NOTE — ED Provider Notes (Addendum)
 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 The patient's wife, who is his primary historian, reports that he has not seemed like himself over the past week.  On evaluation in the ED, plan was for admission for hyperkalemia, vomiting, and diarrhea.  Despite the patient's medical complexity, after consultation and discussion with the hospitalist Dr. Octavia, no inpatient admission was recommended as his symptoms have resolved and laboratory studies are unchanged from baseline. Please see consult note by Dr. Octavia.    The patient's wife remains concerned about the potential for recurrence of symptoms and her ability to manage his care at home, given his significant comorbidities and recent changes in condition.  However, during the patient's 11.5 hr ED course, the patient's GI symptoms resolved and his laboratory studies were found to be at his baseline, consistent with prior data in [facility]where. He received 2L IV fluids with no recurrence of GI symptoms during his prolonged ED stay. He remains at his cognitive baseline per wife, without evidence of acute infection or other decompensation. Infectious workup including C. difficile and GI pathogen panel has been sent, with outpatient follow-up arranged for wound care and nephrology.  The decision to discharge the patient was made over my request for admission, and I have explained this to the patient and his wife. I have provided detailed return precautions and ensured they have a clear plan for follow-up. Given that inpatient admission has been declined, and his symptoms have improved during his ED stay, the patient is to be  discharged at this time. I have offered additional support and resources to help facilitate a safe transition home.

## 2024-01-26 NOTE — Consults (Addendum)
 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   V/D have resolved. Labs are at baseline w/ known CKD; pt has close outpt f/u w/ Nephrology.  No indication for admission at this time. ED did provide 2L IVF (before realizing his labs were at his baseline). I have requested repeat labs s/p IVF and will f/u results of these today.  ED ordering CDif and GIPP so ED result system will gather results and communicate w/ pt/wife; can treat w/ PO Vancomycin  if CDif + (doubt), or recommend imodium if CDif - and if ongoing loose stools.  Pt has local wound clinic for his ostomy care. Advised wound clinic care (had appt tomorrow which wife cancelled but will try to regain)   Thank you for this consult. We will sign off at this time.    ___________________________________________________________________   Reason for Consult:  Pt was seen at the request of Kayla LELON Hoe, MD (/Emergency Medicine) in consultation for hyperK, Q of proctitis,  stool from rectal stump, dehydration/AKI.  History of Present Illness:   [name] is a 8yoM w/ complex PMH including frontotemporal dementia/TBI, sacral decub ulcer requiring colostomy (2021), CKD 3B, hx VTE on anticoag, bipolar disorder, 10/2023 suprapubic catheter placement for retention who presents to [facility] ED for 3d diarrhea in colostomy bag + out of rectal stump, and 1 vomiting episode this AM. Wife provides history and states past 2 days he had loose stools x2 (AM and PM), this AM loose stool again and an episode of vomiting so she brought him to the ED. No further stool or vomiting since in the ED past 9 hrs. Labwork at baseline as compared to [facility] labs from 11/27/23: BUN 49, creat 2.5, bicarb 15 and similar to 06/19/2023 labs: BUN 45, creat 2.48, bicarb 19. He is closely followed in outpt setting by [facility], [facility], wound clinic in [place]. Wife reports he didn't quite seem like himself past week or so, does not give specifics, denies s/sx of infection specifically no F/C, no other concerning changes.  In ED he was provided 2L IVF. No further symptomatic meds since no N/V/D while in ED.   Allergies:  Allergies[1]   Home Medications:   Per most recent [facility] note 12/12/23, list should be accurate per wife amLODipine -olmesartan  (AZOR ) 5-20 MG tablet Take 1 tablet by mouth daily.       [provider]  Continuous Blood Gluc Receiver (DEXCOM G7 RECEIVER) DEVI UAD for blood sugar monitoring. 01/23/22  [provider]  ELIQUIS  5 MG TABS tablet Take 5 mg by mouth 2 (two) times daily.  11/17/18     [provider]  glipiZIDE  (GLUCOTROL  XL) 2.5 MG 24 hr tablet Take 2.5 mg by mouth daily. 02/04/23     [provider]  memantine  (NAMENDA ) 10 MG tablet Take 10 mg by mouth 2 (two) times daily.       [provider]  Oxcarbazepine  (TRILEPTAL ) 300 MG tablet Take 1 tablet (300 mg total) by mouth 2 (two) times daily.  10/07/23     Eappen, Saramma, MD  oxybutynin  (DITROPAN ) 5 MG tablet Take 1 tablet (5 mg total) by mouth every 8 (eight) hours as needed for bladder spasms. 11/11/23     Helon Kirsch A, PA-C  QUEtiapine  (SEROQUEL ) 25 MG tablet Take 1 tablet (25 mg total) by mouth at bedtime. May also take 1 tablet (25 mg total) daily as needed (for anxiety, agitation). 10/07/23 01/05/24   Eappen, Saramma, MD  rosuvastatin  (CRESTOR ) 20 MG tablet Take 20 mg by mouth at bedtime. 08/29/17     [provider]  vitamin B-12 (CYANOCOBALAMIN ) 1000 MCG tablet Take 1,000 mcg by mouth daily.       [provider]     Current Hospital Medications:  Scheduled Meds:Scheduled Medications[2] Continuous Infusions:Infusions Meds[3] PRN Meds:PRN Medications[4]   Medical History:  Past Medical History[5]  Surgical History:  Past Surgical History[6]  Social History:  Social History [7] Living situation: the patient lives with their spouse.  Family History:  Family History[8]  Review of Systems:  10 systems reviewed and are negative unless otherwise mentioned in HPI  Physical Exam:  Patient Vitals for the past 8 hrs:  BP Pulse Resp SpO2  01/26/24 1857 117/71 65 20 --  01/26/24 1659 127/81 54 20 100 %  01/26/24 1414 125/76 54 20 --  01/26/24 1133 116/70 58 20 96 %  01/26/24 1107 116/70 58 20 97 %   No intake/output data recorded.  Gen: chronically ill appearing but alert and responds appropriately to Qs/follows commands ENT: MMM CV: RRR Pulm: CTAB, normal WOB at rest lying flat on back Abd: NABS, soft, nontend, obese Ostomy w/ dark brown semi-formed stool Rectum: no stooling noted Sacral decub appears clean w/ gauze packing (RN preparing to apply dressing) Ext: nonpitting edema BLE symmetric Psych: flat affect   Labs/Studies/Diagnostics:  Data Review:  All lab results last 24 hours:   Recent Results (from the past 24 hours)  Basic Metabolic Panel   Collection Time: 01/26/24 10:49 AM   Result Value Ref Range   Sodium 139 135 - 145 mmol/L   Potassium 5.4 (H) 3.4 - 4.8 mmol/L   Chloride 108 (H) 98 - 107 mmol/L   CO2 16.1 (L) 20.0 - 31.0 mmol/L   Anion Gap 15 (H) 5 - 14 mmol/L   BUN 47 (H) 9 - 23 mg/dL   Creatinine 7.30 (H) 9.26 - 1.18 mg/dL   BUN/Creatinine Ratio 17    eGFR CKD-EPI (2021) Male 25 (L) >=60 mL/min/1.53m2   Glucose 106 70 - 179 mg/dL   Calcium  8.2 (L) 8.7 - 10.4 mg/dL  Lipase   Collection Time: 01/26/24 10:49 AM  Result Value Ref Range   Lipase 45 12 - 53 U/L  Magnesium    Collection Time: 01/26/24 10:49 AM  Result Value Ref Range   Magnesium  1.8 1.6 - 2.6 mg/dL  Phosphorus   Collection Time: 01/26/24 10:49 AM  Result Value Ref Range   Phosphorus  3.2 2.4 - 5.1 mg/dL  CBC w/ Differential   Collection Time: 01/26/24 10:49 AM  Result Value Ref Range   WBC 4.7 3.6 - 11.2 10*9/L   RBC 3.56 (L) 4.26 - 5.60 10*12/L   HGB 9.4 (L) 12.9 - 16.5 g/dL   HCT 70.5 (L) 60.9 - 51.9 %   MCV 82.5 77.6 - 95.7 fL   MCH 26.5 25.9 - 32.4 pg   MCHC 32.1 32.0 - 36.0 g/dL   RDW 79.8 (H) 87.7 - 84.7 %   MPV 8.1 6.8 - 10.7 fL   Platelet 148 (L) 150 - 450 10*9/L   nRBC 0 <=4 /100 WBCs   Neutrophils % 74.0 %   Lymphocytes % 13.4 %   Monocytes % 9.2 %   Eosinophils % 3.0 %   Basophils % 0.4 %   Absolute Neutrophils 3.4 1.8 - 7.8 10*9/L   Absolute Lymphocytes 0.6 (L) 1.1 - 3.6 10*9/L   Absolute Monocytes 0.4 0.3 - 0.8 10*9/L   Absolute Eosinophils 0.1 0.0 - 0.5 10*9/L   Absolute Basophils 0.0 0.0 - 0.1 10*9/L   Anisocytosis Marked (A) Not Present  Morphology Review   Collection Time: 01/26/24 10:49 AM  Result Value Ref Range   Smear Review Comments See Comment Undefined  Urinalysis with Microscopy (Clean Catch)   Collection Time: 01/26/24 12:28 PM  Result Value Ref Range   Color, UA Light Yellow    Clarity, UA Clear    Specific Gravity, UA 1.015 1.003 - 1.030   pH, UA 5.5 5.0 - 9.0   Leukocyte Esterase, UA Large (A) Negative   Nitrite, UA Negative Negative    Protein, UA 30 mg/dL (A) Negative   Glucose, UA Negative Negative   Ketones, UA Negative Negative   Urobilinogen, UA <2.0 mg/dL <7.9 mg/dL   Bilirubin, UA Negative Negative   Blood, UA Small (A) Negative   RBC, UA 7 (H) <=3 /HPF   WBC, UA 99 (H) <=2 /HPF   Squam Epithel, UA <1 0 - 5 /HPF   Bacteria, UA None Seen None Seen /HPF   Mucus, UA Rare (A) None Seen /HPF  ECG 12 Lead   Collection Time: 01/26/24  3:22 PM  Result Value Ref Range   EKG Systolic BP  mmHg   EKG Diastolic BP  mmHg   EKG Ventricular Rate 52 BPM   EKG Atrial Rate 52 BPM   EKG P-R Interval 168 ms   EKG QRS Duration 76 ms   EKG Q-T Interval 410 ms   EKG QTC Calculation 381 ms   EKG Calculated P Axis 35 degrees   EKG Calculated R Axis 53 degrees   EKG Calculated T Axis 22 degrees   QTC Fredericia 390 ms  Blood Gas, Venous   Collection Time: 01/26/24  6:50 PM  Result Value Ref Range   Specimen Source Venous    FIO2 Venous Not Specified    pH, Venous 7.20 (L) 7.32 - 7.43   pCO2, Ven 45 40 - 60 mm Hg   pO2, Ven 29 (L) 35 - 40 mm Hg   HCO3, Ven 16 (L) 22 - 27 mmol/L   Base Excess, Ven -10.5 (L) -2.0 - 2.0   O2 Saturation, Venous 46.9 40.0 - 85.0 %  CBC   Collection Time: 01/26/24  7:03 PM  Result Value Ref Range   WBC 5.4 3.6 - 11.2 10*9/L   RBC 3.70 (L) 4.26 - 5.60 10*12/L   HGB 9.7 (L) 12.9 - 16.5  g/dL   HCT 69.4 (L) 60.9 - 51.9 %   MCV 82.3 77.6 - 95.7 fL   MCH 26.3 25.9 - 32.4 pg   MCHC 31.9 (L) 32.0 - 36.0 g/dL   RDW 79.4 (H) 87.7 - 84.7 %   MPV 8.5 6.8 - 10.7 fL   Platelet 150 150 - 450 10*9/L   ECG: normal sinus rhythm, no blocks or conduction defects, no ischemic changes Sinus brady 52,  Imaging: Radiology studies were personally reviewed CT abd/pelv 1.  Circumferential rectal wall thickening, which can be seen with proctitis.  2.  Large sacral decubitus ulcer, increased in the size from priors. If there is concern for osteomyelitis, consider dedicated MSK pelvic MRI with contrast.  3.   Urinary bladder is decompressed secondary to suprapubic catheter, with diffuse wall thickening and perivesicular soft tissue stranding. Consider correlation with urinalysis.  4.  Cholelithiasis, without evidence of acute cholecystitis.  5.  Similar nonspecific enlarged right inguinal lymph node.     Counseling/Coordination of Care:  I personally spent greater than 80 minutes face-to-face and non-face-to-face in the care of this patient, which includes all pre, intra, and post visit time on the date of service.  All documented time was specific to the E/M visit and does not include any procedures that may have been performed.          [1] No Known Allergies [2] [3] [4] [5] Past Medical History: Diagnosis Date  . Bipolar disorder     . Diabetes mellitus     . DVT, recurrent, lower extremity, acute     . Hypertension   . Neurogenic bladder   . Spinal stenosis   . Traumatic brain injury (CMS-HCC) 2000s   brain hematoma or fluid sitting around the brain.  He is on    [6] Past Surgical History: Procedure Laterality Date  . CARPAL TUNNEL RELEASE    . LUMBAR DISC SURGERY  12/04/2004   L4-L5 laminectomy  . PR COLOSTOMY N/A 07/29/2019   Procedure: COLOSTOMY OR SKIN LEVEL CECOSTOMY;  Surgeon: Curtistine Mira Dunnings, MD;  Location: MAIN OR [facility];  Service: Trauma  . PR DEBRIDE NECROT SKIN/ TISS, GENIT,PERI, ADB WALL Midline 07/23/2019   Procedure: Debridement Skin, Subq Tissue, Muscle/Fascia Necrotize Soft Tissu Infection Abd Wall;  Surgeon: Tinnie Verla Dasie Quinn, MD;  Location: MAIN OR [facility];  Service: Trauma  . PR DEBRIDE NECROT SKIN/ TISS, GENIT,PERI, ADB WALL N/A 07/29/2019   Procedure: DEBRIDEMENT SKIN, SUBQ TISSUE, MUSCLE/FASCIA NECROTIZE SOFT TISSU INFECTION EXT GENITAL/PERINEUM & ABD WALL;  Surgeon: Curtistine Mira Dunnings, MD;  Location: MAIN OR [facility];  Service: Trauma  . PR DEEP INCIS FOOT BONE INFECTN Right 07/18/2020   Procedure: INCISION, BONE CORTEX, FOOT;  Surgeon:  Garnette Lonni Craven, DPM;  Location: MAIN OR [facility];  Service: Vascular  . PR SUB GRFT F/S/N/H/F/G/M/D /<100SCM /<1ST 25 SCM Right 07/18/2020   Procedure: APPLY GRAFT FACE/SCALP/EYE/MOUTH/NECK/EAR/ORBIT/GENITAL/HAND/FEET/MULT DGT, AREA TO 100 SQ CM; 1ST 25 SQ CM;  Surgeon: Garnette Lonni Craven, DPM;  Location: MAIN OR [facility];  Service: Vascular  . TONSILLECTOMY    [7] Social History Socioeconomic History  . Marital status: Married  Tobacco Use  . Smoking status: Never  . Smokeless tobacco: Never  Vaping Use  . Vaping status: Never Used  Substance and Sexual Activity  . Alcohol use: Not Currently  . Drug use: Never  Social History Narrative   ** Merged History Encounter **       Social Drivers of Dispensing optician  Resource Strain: Low Risk  (12/31/2020)   Overall Financial Resource Strain (CARDIA)   . Difficulty of Paying Living Expenses: Not very hard  Food Insecurity: Patient Declined (09/16/2023)   Received from [facility]   Hunger Vital Sign   . Within the past 12 months, you worried that your food would run out before you got the money to buy more.: Patient declined   . Within the past 12 months, the food you bought just didn't last and you didn't have money to get more.: Patient declined  Transportation Needs: Patient Declined (09/16/2023)   Received from [facility] - Transportation   . Lack of Transportation (Medical): Patient declined   . Lack of Transportation (Non-Medical): Patient declined  Physical Activity: Inactive (09/09/2017)   Received from [facility]   Exercise Vital Sign   . Days of Exercise per Week: 0 days   . Minutes of Exercise per Session: 0 min  Stress: No Stress Concern Present (09/09/2017)   Received from [facility] - Occupational Stress Questionnaire   . Feeling of Stress : Not at all  Social Connections: Unknown (09/17/2023)   Received from [facility]   Social Connection and  Isolation Panel   . In a typical week, how many times do you talk on the phone with family, friends, or neighbors?: Patient declined   . How often do you get together with friends or relatives?: Patient declined   . How often do you attend church or religious services?: Patient declined   . Do you belong to any clubs or organizations such as church groups, unions, fraternal or athletic groups, or school groups?: Patient declined   . How often do you attend meetings of the clubs or organizations you belong to?: Patient declined   . Are you married, widowed, divorced, separated, never married, or living with a partner?: Married  [8] Family History Problem Relation Age of Onset  . Cancer Father   . Deep vein thrombosis Father

## 2024-01-26 NOTE — ED Provider Notes (Signed)
 Received sign out from previous provider. For further history please see their note.  68 yo M h/w dehydration 2/2 diarrhea x3 days, c/f AKI on ckd based on labs from several years ago, CT with proctitis, not started on abx. Paged out for admission.   IPASS summary and updates: ED Course as of 01/27/24 1303  Mon Jan 26, 2024  2046 GI Pathogen Panel (instead of Stool O&P)  2046 Spoke with medicine admitting team. They patients labe are at baseline. He has has good fluid resusciation. They see no reason for admission and have written consult note after assessing the patient. They recommend discharge home. I discussed return cautions with the patient and they will discharge home at this time.   C. Diff. Assay negative and GI path panel negative. Did provide dose of lokelma  here at advice of the admitting team.      Final diagnoses:  None    Vitals:   01/26/24 1107 01/26/24 1133 01/26/24 1414 01/26/24 1659  BP: 116/70 116/70 125/76 127/81  Pulse: 58 58 54 54  Resp: 20 20 20 20   Temp:      TempSrc:      SpO2: 97% 96%  100%  Weight:        CT Abdomen Pelvis Wo Contrast  Final Result  1.  Circumferential rectal wall thickening, which can be seen with proctitis.  2.  Large sacral decubitus ulcer, increased in the size from priors. If there is concern for osteomyelitis, consider dedicated MSK pelvic MRI with contrast.  3.  Urinary bladder is decompressed secondary to suprapubic catheter, with diffuse wall thickening and perivesicular soft tissue stranding. Consider correlation with urinalysis.  4.  Cholelithiasis, without evidence of acute cholecystitis.  5.  Similar nonspecific enlarged right inguinal lymph node.          Lab Results  Component Value Date   WBC 4.7 01/26/2024   HGB 9.4 (L) 01/26/2024   HCT 29.4 (L) 01/26/2024   PLT 148 (L) 01/26/2024    Lab Results  Component Value Date   NA 139 01/26/2024   K 5.4 (H) 01/26/2024   CL 108 (H) 01/26/2024   CO2 16.1 (L)  01/26/2024   BUN 47 (H) 01/26/2024   CREATININE 2.69 (H) 01/26/2024   GLU 106 01/26/2024   CALCIUM  8.2 (L) 01/26/2024   MG 1.8 01/26/2024   PHOS 3.2 01/26/2024    Lab Results  Component Value Date   BILITOT 0.6 12/29/2020   BILIDIR 0.50 (H) 07/07/2020   PROT 8.2 12/29/2020   ALBUMIN 2.5 (L) 12/29/2020   ALT 22 12/29/2020   AST 40 (H) 12/29/2020   ALKPHOS 106 12/29/2020    Lab Results  Component Value Date   LABPROT 49.8 (H) 04/01/2013   INR 1.63 12/29/2020   APTT 30.3 12/29/2020

## 2024-01-26 NOTE — ED Provider Notes (Signed)
 [facility] Emergency Department Provider Note   ED Clinical Impression   Final diagnoses:  None    Impression, Medical Decision Making, ED Course   Impression/MDM: 68 y.o. male with PMH most significant for chronic diastolic CHF, sacral decubitus ulcer, type 2 diabetes, history of traumatic brain injury, history of PE and DVT, frontotemporal dementia, neurogenic bladder chronic suprapubic foley and colostomy bag presented today to the emergency department for complaint of 3 days of diarrhea and 1 episode of emesis this morning.  Patient well-appearing and not in acute distress.  Patient hemodynamically stable and currently afebrile.  Cardiopulmonary exam unremarkable.  Abdominal exam revealed no tenderness to palpation.  Sacral ulcer examined, no concern for infection at this time.  Given presence of colostomy bag and surgical history, concern for  small bowel obstruction due to possible adhesions though low suspicion.  Ordered CT abdomen and pelvis to rule out.  Also given immobility of patient, concern for UTI.  Differential diagnosis also includes gastroenteritis.   Patient labs revealed likely UTI and AKI.  Given elevated potassium, EKG ordered to rule out cardiac abnormalities. Given patient history, and lab work will likely admit for management.  Patient care transferred to evening team.   Diagnostic workup as below.  Orders Placed This Encounter  Procedures  . CT Abdomen Pelvis Wo Contrast  . Basic Metabolic Panel  . CBC w/ Differential  . Lipase  . Magnesium   . Phosphorus  . Urinalysis with Microscopy (Clean Catch)    ED Course as of 01/26/24 1421  Mon Jan 26, 2024  1120 Basic Metabolic Panel(!):   Sodium 139  Potassium 5.4(!)  Chloride 108(!)  CO2 16.1(!)  Anion Gap 15(!)  Bun 47(!)  Creatinine 2.69(!)  BUN/Creatinine Ratio 17  eGFR CKD-EPI (2021) Male 25(!)  Glucose 106  Calcium  8.2(!) BMP suggest dehydration and AKI   1251 Magnesium :   Magnesium  1.8  1251  Lipase:   Lipase 45 Concern for pancreatitis low  1253 CT Abdomen Pelvis Wo Contrast First impression of CT abdomen and pelvis revealed no air-fluid levels suggestive of small bowel obstruction  1314 Urinalysis with Microscopy (Clean Catch)(!):   Color, UA Light Yellow  Clarity, UA Clear  Spec Grav, UA 1.015  pH, UA 5.5  Leukocyte Esterase, UA Large(!)  Nitrite, UA Negative  Protein, UA 30 mg/dL(!)  Glucose, UA Negative  Ketones, UA Negative  Urobilinogen, UA <2.0 mg/dL  Bilirubin, UA Negative  Blood, UA Small(!)  RBC, UA 7(!)  WBC, UA 99(!)  Squam Epithel, UA <1  Bacteria, UA None Seen  Mucus, UA Rare(!)   ____________  The case was discussed with the attending physician, who is in agreement with the above assessment and plan.    History   Chief Complaint Chief Complaint  Patient presents with  . Diarrhea    HPI  [name] is a 68 y.o. male with past medical history of chronic diastolic CHF, sacral decubitus ulcer, type 2 diabetes, history of traumatic brain injury, history of PE and DVT, frontotemporal dementia, neurogenic bladder chronic suprapubic foley and colostomy bag presented today to the emergency department for complaint of 3 days of diarrhea and 1 episode of emesis this morning.  Per patient's wife, diarrhea is coming out of both colostomy bag and anus.  Patient denies any blood in stool.  Denies any blood in urine. Patient denies fever, shortness of breath, chest pain, abdominal pain.  Patient denies any urinary frequency or pain on urination.  Outside Historian(s):  I have obtained additional history/collateral from partner.  Past Medical History[1]  Past Surgical History[2]  Active Medications[3]   Allergies[4]  Family History[5]  Short Social History[6]   Physical Exam   VITAL SIGNS:    Vitals:   01/26/24 0959 01/26/24 1107 01/26/24 1133 01/26/24 1414  BP: 134/58 116/70 116/70 125/76  Pulse:  58 58 54  Resp:  20 20 20    Temp:      TempSrc:      SpO2:  97% 96%   Weight:        Constitutional: Alert and oriented. No acute distress. Eyes: Conjunctivae are normal. HEENT: Normocephalic and atraumatic. Conjunctivae clear. No congestion. Moist mucous membranes.  Cardiovascular: Rate as above, regular rhythm. Normal and symmetric distal pulses. Brisk capillary refill. Normal skin turgor. Respiratory: Normal respiratory effort. Breath sounds are normal. There are no wheezing or crackles heard. Gastrointestinal: Soft, non-distended, non-tender. Colostomy bag present at LLQ.  Genitourinary: Deferred. Musculoskeletal: Non-tender with normal range of motion in all extremities. Neurologic: Normal speech and language. No gross focal neurologic deficits are appreciated. Face is symmetric at rest and with speech. Skin: Skin is warm, dry and intact. No rash noted. Chronic Sacral ulcer present - nonerythematous.  Psychiatric: Mood and affect are normal. Speech and behavior are normal.   Radiology   CT Abdomen Pelvis Wo Contrast    (Results Pending)    Pertinent labs & imaging results that were available during my care of the patient were independently interpreted by me and considered in my medical decision making (see chart for details).  Portions of this record have been created using Scientist, clinical (histocompatibility and immunogenetics). Dictation errors have been sought, but may not have been identified and corrected.        [1] Past Medical History: Diagnosis Date  . Bipolar disorder     . Diabetes mellitus     . DVT, recurrent, lower extremity, acute     . Hypertension   . Neurogenic bladder   . Spinal stenosis   . Traumatic brain injury (CMS-HCC) 2000s   brain hematoma or fluid sitting around the brain.  He is on    [2] Past Surgical History: Procedure Laterality Date  . CARPAL TUNNEL RELEASE    . LUMBAR DISC SURGERY  12/04/2004   L4-L5 laminectomy  . PR COLOSTOMY N/A 07/29/2019   Procedure: COLOSTOMY OR SKIN LEVEL  CECOSTOMY;  Surgeon: Curtistine Mira Dunnings, MD;  [facility];  Service: Trauma  . PR DEBRIDE NECROT SKIN/ TISS, GENIT,PERI, ADB WALL Midline 07/23/2019   Procedure: Debridement Skin, Subq Tissue, Muscle/Fascia Necrotize Soft Tissu Infection Abd Wall;  Surgeon: Tinnie Verla Dasie Quinn, MD;  Location: MAIN OR [facility];  Service: Trauma  . PR DEBRIDE NECROT SKIN/ TISS, GENIT,PERI, ADB WALL N/A 07/29/2019   Procedure: DEBRIDEMENT SKIN, SUBQ TISSUE, MUSCLE/FASCIA NECROTIZE SOFT TISSU INFECTION EXT GENITAL/PERINEUM & ABD WALL;  Surgeon: Curtistine Mira Dunnings, MD;  Location: MAIN OR [facility];  Service: Trauma  . PR DEEP INCIS FOOT BONE INFECTN Right 07/18/2020   Procedure: INCISION, BONE CORTEX, FOOT;  Surgeon: Garnette Lonni Craven, DPM;  Location: MAIN [facility];  Service: Vascular  . PR SUB GRFT F/S/N/H/F/G/M/D /<100SCM /<1ST 25 SCM Right 07/18/2020   Procedure: APPLY GRAFT FACE/SCALP/EYE/MOUTH/NECK/EAR/ORBIT/GENITAL/HAND/FEET/MULT DGT, AREA TO 100 SQ CM; 1ST 25 SQ CM;  Surgeon: Garnette Lonni Craven, DPM;  Location: MAIN OR [facility];  Service: Vascular  . TONSILLECTOMY    [3] No current facility-administered medications for this encounter.   Current Outpatient Medications  Medication Sig Dispense Refill  . amLODIPine  (NORVASC ) 5 MG tablet Take 5 mg by mouth daily.    . apixaban  (ELIQUIS ) 5 mg Tab Take 5 mg by mouth Two (2) times a day.    . cyanocobalamin , vitamin B-12, 1000 MCG tablet Take 3,000 mcg by mouth daily.    . fish oil-omega-3 fatty acids 300-1,000 mg capsule Take 1 g by mouth daily.    . glipiZIDE  (GLUCOTROL  XL) 10 MG 24 hr tablet Take 10 mg by mouth every morning.    . memantine  (NAMENDA ) 10 MG tablet Take 10 mg by mouth Two (2) times a day.    . metroNIDAZOLE  (FLAGYL ) 500 MG tablet Take 1 tablet (500 mg total) by mouth Three (3) times a day. 30 tablet 0  . OXcarbazepine  (TRILEPTAL ) 300 MG tablet Take 300 mg by mouth Two (2) times a day.     . QUEtiapine  (SEROQUEL ) 100  MG tablet Take 100 mg by mouth nightly.     . QUEtiapine  (SEROQUEL ) 50 MG tablet Take 50 mg by mouth every morning.    . rosuvastatin  (CRESTOR ) 20 MG tablet Take 20 mg by mouth daily.    [4] No Known Allergies [5] Family History Problem Relation Age of Onset  . Cancer Father   . Deep vein thrombosis Father   [6] Social History Tobacco Use  . Smoking status: Never  . Smokeless tobacco: Never  Vaping Use  . Vaping status: Never Used  Substance Use Topics  . Alcohol use: Not Currently  . Drug use: Never   Lyndy Fairy FALCON, MD Resident 01/26/24 [phone]

## 2024-01-27 ENCOUNTER — Ambulatory Visit: Admitting: Physician Assistant

## 2024-01-30 ENCOUNTER — Ambulatory Visit: Admitting: Physician Assistant

## 2024-01-30 DIAGNOSIS — Z435 Encounter for attention to cystostomy: Secondary | ICD-10-CM | POA: Diagnosis not present

## 2024-01-30 DIAGNOSIS — N3289 Other specified disorders of bladder: Secondary | ICD-10-CM

## 2024-01-30 MED ORDER — OXYBUTYNIN CHLORIDE ER 10 MG PO TB24: 10.0000 mg | ORAL_TABLET | Freq: Every day | ORAL | 11 refills | Status: DC

## 2024-01-30 NOTE — Progress Notes (Signed)
 Suprapubic Cath Change  Patient is present today for a suprapubic catheter change due to urinary retention.  8ml of water was drained from the balloon, a 16FR foley cath was removed from the tract with out difficulty.  Site was cleaned and prepped in a sterile fashion with betadine.  A 16FR foley cath was replaced into the tract no complications were noted. Urine return was noted, 10 ml of sterile water was inflated into the balloon and a night bag was attached for drainage.  Patient tolerated well.   Performed by: Shadoe Cryan, PA-C  Additional notes: They report continued urine leaks per urethra. No significant drainage with SPT change, so I do not think this is an impaired drainage problem. Suspect bladder spasms, not atypical for him. He is on oxybutynin  IR 5mg  but takes it once daily. I switched him to oxybutynin  XL 10mg  daily as an alternative.  Follow up: Return in about 4 weeks (around 02/27/2024) for SPT exchange.

## 2024-02-10 ENCOUNTER — Encounter: Attending: Physician Assistant | Admitting: Physician Assistant

## 2024-02-10 DIAGNOSIS — I10 Essential (primary) hypertension: Secondary | ICD-10-CM | POA: Insufficient documentation

## 2024-02-10 DIAGNOSIS — Z7901 Long term (current) use of anticoagulants: Secondary | ICD-10-CM | POA: Insufficient documentation

## 2024-02-10 DIAGNOSIS — L89154 Pressure ulcer of sacral region, stage 4: Secondary | ICD-10-CM | POA: Diagnosis present

## 2024-02-10 DIAGNOSIS — Z87820 Personal history of traumatic brain injury: Secondary | ICD-10-CM | POA: Diagnosis not present

## 2024-02-10 DIAGNOSIS — L97822 Non-pressure chronic ulcer of other part of left lower leg with fat layer exposed: Secondary | ICD-10-CM | POA: Diagnosis not present

## 2024-02-10 DIAGNOSIS — E11622 Type 2 diabetes mellitus with other skin ulcer: Secondary | ICD-10-CM | POA: Insufficient documentation

## 2024-02-10 DIAGNOSIS — F319 Bipolar disorder, unspecified: Secondary | ICD-10-CM | POA: Diagnosis not present

## 2024-02-10 DIAGNOSIS — M6281 Muscle weakness (generalized): Secondary | ICD-10-CM | POA: Insufficient documentation

## 2024-02-10 DIAGNOSIS — M8668 Other chronic osteomyelitis, other site: Secondary | ICD-10-CM | POA: Diagnosis not present

## 2024-02-16 ENCOUNTER — Ambulatory Visit: Payer: Self-pay | Admitting: Urology

## 2024-02-25 ENCOUNTER — Inpatient Hospital Stay: Attending: Oncology

## 2024-02-25 ENCOUNTER — Inpatient Hospital Stay

## 2024-02-25 VITALS — BP 105/61

## 2024-02-25 DIAGNOSIS — Z7984 Long term (current) use of oral hypoglycemic drugs: Secondary | ICD-10-CM | POA: Diagnosis not present

## 2024-02-25 DIAGNOSIS — N189 Chronic kidney disease, unspecified: Secondary | ICD-10-CM

## 2024-02-25 DIAGNOSIS — I130 Hypertensive heart and chronic kidney disease with heart failure and stage 1 through stage 4 chronic kidney disease, or unspecified chronic kidney disease: Secondary | ICD-10-CM | POA: Diagnosis present

## 2024-02-25 DIAGNOSIS — N183 Chronic kidney disease, stage 3 unspecified: Secondary | ICD-10-CM | POA: Insufficient documentation

## 2024-02-25 DIAGNOSIS — Z86718 Personal history of other venous thrombosis and embolism: Secondary | ICD-10-CM | POA: Diagnosis not present

## 2024-02-25 DIAGNOSIS — D631 Anemia in chronic kidney disease: Secondary | ICD-10-CM | POA: Insufficient documentation

## 2024-02-25 DIAGNOSIS — E1122 Type 2 diabetes mellitus with diabetic chronic kidney disease: Secondary | ICD-10-CM | POA: Insufficient documentation

## 2024-02-25 DIAGNOSIS — Z79899 Other long term (current) drug therapy: Secondary | ICD-10-CM | POA: Insufficient documentation

## 2024-02-25 DIAGNOSIS — Z7901 Long term (current) use of anticoagulants: Secondary | ICD-10-CM | POA: Insufficient documentation

## 2024-02-25 LAB — CBC WITH DIFFERENTIAL/PLATELET
Abs Immature Granulocytes: 0.01 K/uL (ref 0.00–0.07)
Basophils Absolute: 0 K/uL (ref 0.0–0.1)
Basophils Relative: 1 %
Eosinophils Absolute: 0.7 K/uL — ABNORMAL HIGH (ref 0.0–0.5)
Eosinophils Relative: 13 %
HCT: 31.9 % — ABNORMAL LOW (ref 39.0–52.0)
Hemoglobin: 9.5 g/dL — ABNORMAL LOW (ref 13.0–17.0)
Immature Granulocytes: 0 %
Lymphocytes Relative: 22 %
Lymphs Abs: 1.1 K/uL (ref 0.7–4.0)
MCH: 25.8 pg — ABNORMAL LOW (ref 26.0–34.0)
MCHC: 29.8 g/dL — ABNORMAL LOW (ref 30.0–36.0)
MCV: 86.7 fL (ref 80.0–100.0)
Monocytes Absolute: 0.4 K/uL (ref 0.1–1.0)
Monocytes Relative: 8 %
Neutro Abs: 2.9 K/uL (ref 1.7–7.7)
Neutrophils Relative %: 56 %
Platelets: 152 K/uL (ref 150–400)
RBC: 3.68 MIL/uL — ABNORMAL LOW (ref 4.22–5.81)
RDW: 19.2 % — ABNORMAL HIGH (ref 11.5–15.5)
WBC: 5.2 K/uL (ref 4.0–10.5)
nRBC: 0 % (ref 0.0–0.2)

## 2024-02-25 MED ORDER — EPOETIN ALFA-EPBX 40000 UNIT/ML IJ SOLN
40000.0000 [IU] | Freq: Once | INTRAMUSCULAR | Status: AC
  Administered 2024-02-25: 40000 [IU] via SUBCUTANEOUS
  Filled 2024-02-25: qty 1

## 2024-02-26 NOTE — Progress Notes (Signed)
   Suprapubic Cath Change  Patient is present today for a suprapubic catheter change due to urinary retention.  9 ml of water was drained from the balloon, a 16 FR foley cath was removed from the tract with out difficulty.  Site was cleaned and prepped in a sterile fashion with betadine.  A 16 FR foley cath was replaced into the tract no complications were noted. Urine return was noted, 10 ml of sterile water was inflated into the balloon and a night bag was attached for drainage.  Patient tolerated well. A night bag was given to patient and proper instruction was given on how to switch bags.    Performed by: CLOTILDA CORNWALL, PA-C and Beauford DELENA Browner, CMA    Follow up: Return in about 1 month (around 03/29/2024) for SPT exchange .

## 2024-02-27 ENCOUNTER — Ambulatory Visit: Admitting: Urology

## 2024-02-27 VITALS — BP 134/74 | HR 83

## 2024-02-27 DIAGNOSIS — Z435 Encounter for attention to cystostomy: Secondary | ICD-10-CM | POA: Diagnosis not present

## 2024-03-02 ENCOUNTER — Encounter: Attending: Physician Assistant | Admitting: Physician Assistant

## 2024-03-02 DIAGNOSIS — E11622 Type 2 diabetes mellitus with other skin ulcer: Secondary | ICD-10-CM | POA: Diagnosis not present

## 2024-03-02 DIAGNOSIS — L89154 Pressure ulcer of sacral region, stage 4: Secondary | ICD-10-CM | POA: Insufficient documentation

## 2024-03-02 DIAGNOSIS — L89623 Pressure ulcer of left heel, stage 3: Secondary | ICD-10-CM | POA: Insufficient documentation

## 2024-03-02 DIAGNOSIS — Z87820 Personal history of traumatic brain injury: Secondary | ICD-10-CM | POA: Insufficient documentation

## 2024-03-02 DIAGNOSIS — Z7901 Long term (current) use of anticoagulants: Secondary | ICD-10-CM | POA: Insufficient documentation

## 2024-03-02 DIAGNOSIS — L97822 Non-pressure chronic ulcer of other part of left lower leg with fat layer exposed: Secondary | ICD-10-CM | POA: Insufficient documentation

## 2024-03-02 DIAGNOSIS — M6281 Muscle weakness (generalized): Secondary | ICD-10-CM | POA: Insufficient documentation

## 2024-03-02 DIAGNOSIS — I10 Essential (primary) hypertension: Secondary | ICD-10-CM | POA: Insufficient documentation

## 2024-03-02 DIAGNOSIS — F319 Bipolar disorder, unspecified: Secondary | ICD-10-CM | POA: Insufficient documentation

## 2024-03-02 DIAGNOSIS — M868X8 Other osteomyelitis, other site: Secondary | ICD-10-CM | POA: Insufficient documentation

## 2024-03-02 DIAGNOSIS — L89613 Pressure ulcer of right heel, stage 3: Secondary | ICD-10-CM | POA: Diagnosis not present

## 2024-03-05 ENCOUNTER — Ambulatory Visit: Admitting: Urology

## 2024-03-07 NOTE — Progress Notes (Unsigned)
 03/08/2024 6:54 PM   [name] June 10, 1956 [id number]  Referring provider: Susann Agent, MD [address] [place],  [place] [address]  Urological history: 1. Neurogenic bladder -Since early 2000 secondary to TBI -Managed by indwelling Foley   2. High risk  hematuria  -non-smoker -non contrast CT (01/2023) - mild hydronephrosis -cysto/right retrograde (09/2022) -J hooking of right distal ureter, no tumors and wide open prostatic fossa from the prior TURP  No chief complaint on file.  HPI: [name] is a 68 y.o. man who presents today for yearly follow up with his wife, [name].    Previous records reviewed.  He has a history of neurogenic bladder which is managed with an SPT that is changed monthly.  Serum creatinine (03/2024) 2.79, eGFR 24  Hemoglobin A1c (03/2024) 5.7   PMH: Past Medical History:  Diagnosis Date   Acute exacerbation of CHF (congestive heart failure) (HCC)    Anemia    Bipolar disorder (HCC)    Bladder mass    Cellulitis    Chronic anticoagulation    Chronic indwelling Foley catheter    CKD (chronic kidney disease) stage 3, GFR 30-59 ml/min (HCC)    Colostomy in place (HCC)    Diabetes mellitus without complication (HCC)    Frontotemporal dementia (HCC)    Heel ulcer (HCC)    Left   Hematuria    History of blood clots    Hydronephrosis of right kidney    Hyperkalemia    Hypertension    Hyponatremia    Lithium  toxicity    Metabolic acidosis    Neurogenic bladder    Obesity    Sacral decubitus ulcer    Sepsis (HCC)    TBI (traumatic brain injury) (HCC)    UTI (urinary tract infection)    VRE (vancomycin  resistant enterococcus) culture positive     Surgical History: Past Surgical History:  Procedure Laterality Date   BACK SURGERY     CARPAL TUNNEL RELEASE Bilateral    COLON SURGERY     COLOSTOMY     CYSTOSCOPY W/ RETROGRADES Right 10/25/2022   Procedure: CYSTOSCOPY WITH RETROGRADE PYELOGRAM;   Surgeon: Francisca Redell BROCKS, MD;  Location: [facility] ORS;  Service: Urology;  Laterality: Right;   IR CYSTOSTOMY TUBE PLACEMENT/BLADDER ASPIRATION  12/12/2023   IR PERC TUN PERIT CATH WO PORT S&I /IMAG  10/11/2021   IR REMOVAL TUN CV CATH W/O FL  11/19/2021   IR US  GUIDE VASC ACCESS RIGHT  10/11/2021   SACRAL DECUBITUS ULCER EXCISION     TONSILLECTOMY      Home Medications:  Allergies as of 03/08/2024   No Known Allergies      Medication List        Accurate as of March 07, 2024  6:54 PM. If you have any questions, ask your nurse or doctor.          amLODipine -olmesartan  5-20 MG tablet Commonly known as: AZOR  Take 1 tablet by mouth daily.   cyanocobalamin  1000 MCG tablet Commonly known as: VITAMIN B12 Take 1,000 mcg by mouth daily.   Dexcom G7 Receiver Devi UAD for blood sugar monitoring.   Eliquis  5 MG Tabs tablet Generic drug: apixaban  Take 5 mg by mouth 2 (two) times daily.   glipiZIDE  2.5 MG 24 hr tablet Commonly known as: GLUCOTROL  XL Take 2.5 mg by mouth daily.   Lokelma  10 g Pack packet Generic drug: sodium zirconium cyclosilicate  Take 10 g by mouth.   memantine  10 MG tablet  Commonly known as: NAMENDA  Take 10 mg by mouth 2 (two) times daily.   Oxcarbazepine  300 MG tablet Commonly known as: TRILEPTAL  Take 1 tablet (300 mg total) by mouth 2 (two) times daily.   oxybutynin  10 MG 24 hr tablet Commonly known as: DITROPAN -XL Take 1 tablet (10 mg total) by mouth daily.   QUEtiapine  25 MG tablet Commonly known as: SEROquel  Take 1 tablet (25 mg total) by mouth at bedtime. May also take 1 tablet (25 mg total) daily as needed (for anxiety, agitation).   rosuvastatin  20 MG tablet Commonly known as: CRESTOR  Take 20 mg by mouth at bedtime.        Allergies: No Known Allergies  Family History: Family History  Problem Relation Age of Onset   Depression Father    Deep vein thrombosis Father     Social History:  reports that he has never smoked. He has  never been exposed to tobacco smoke. He has never used smokeless tobacco. He reports that he does not currently use alcohol. He reports that he does not use drugs.  ROS: Pertinent ROS in HPI  Physical Exam: There were no vitals taken for this visit.  Constitutional:  Well nourished. Alert and oriented, No acute distress. HEENT: [place] AT, moist mucus membranes.  Trachea midline, no masses. Cardiovascular: No clubbing, cyanosis, or edema. Respiratory: Normal respiratory effort, no increased work of breathing. GI: Abdomen is soft, non tender, non distended, no abdominal masses. Liver and spleen not palpable.  No hernias appreciated.  Stool sample for occult testing is not indicated.   GU: No CVA tenderness.  No bladder fullness or masses.  Patient with circumcised/uncircumcised phallus. ***Foreskin easily retracted***  Urethral meatus is patent.  No penile discharge. No penile lesions or rashes. Scrotum without lesions, cysts, rashes and/or edema.  Testicles are located scrotally bilaterally. No masses are appreciated in the testicles. Left and right epididymis are normal. Rectal: Patient with  normal sphincter tone. Anus and perineum without scarring or rashes. No rectal masses are appreciated. Prostate is approximately *** grams, *** nodules are appreciated. Seminal vesicles are normal. Skin: No rashes, bruises or suspicious lesions. Lymph: No cervical or inguinal adenopathy. Neurologic: Grossly intact, no focal deficits, moving all 4 extremities. Psychiatric: Normal mood and affect.  Laboratory Data: Lab Results  Component Value Date   WBC 5.2 02/25/2024   HGB 9.5 (L) 02/25/2024   HCT 31.9 (L) 02/25/2024   MCV 86.7 02/25/2024   PLT 152 02/25/2024    Lab Results  Component Value Date   CREATININE 2.44 (H) 12/26/2023    Lab Results  Component Value Date   HGBA1C 5.9 (H) 07/19/2023  I have reviewed the labs.  See HPI.    Pertinent Imaging: N/A  Assessment & Plan:  ***  1.   Neurogenic bladder - Managed with SPT exchanged monthly  2. High risk hematuria - no reports of gross heme  No follow-ups on file.  These notes generated with voice recognition software. I apologize for typographical errors.  [name]  [facility] [address]  [address] [place], [place] [address] [phone]

## 2024-03-08 ENCOUNTER — Ambulatory Visit (INDEPENDENT_AMBULATORY_CARE_PROVIDER_SITE_OTHER): Admitting: Urology

## 2024-03-08 ENCOUNTER — Encounter: Payer: Self-pay | Admitting: Urology

## 2024-03-08 VITALS — BP 147/78 | HR 65

## 2024-03-08 DIAGNOSIS — N319 Neuromuscular dysfunction of bladder, unspecified: Secondary | ICD-10-CM | POA: Diagnosis not present

## 2024-03-08 DIAGNOSIS — R319 Hematuria, unspecified: Secondary | ICD-10-CM

## 2024-03-15 ENCOUNTER — Encounter: Payer: Self-pay | Admitting: Psychiatry

## 2024-03-15 ENCOUNTER — Ambulatory Visit: Admitting: Psychiatry

## 2024-03-15 ENCOUNTER — Other Ambulatory Visit: Payer: Self-pay

## 2024-03-15 VITALS — BP 138/76 | HR 69 | Temp 97.6°F | Ht 69.0 in | Wt 243.0 lb

## 2024-03-15 DIAGNOSIS — F0390 Unspecified dementia without behavioral disturbance: Secondary | ICD-10-CM | POA: Diagnosis not present

## 2024-03-15 DIAGNOSIS — G4701 Insomnia due to medical condition: Secondary | ICD-10-CM | POA: Diagnosis not present

## 2024-03-15 DIAGNOSIS — F411 Generalized anxiety disorder: Secondary | ICD-10-CM | POA: Diagnosis not present

## 2024-03-15 DIAGNOSIS — F3176 Bipolar disorder, in full remission, most recent episode depressed: Secondary | ICD-10-CM | POA: Diagnosis not present

## 2024-03-15 DIAGNOSIS — F3181 Bipolar II disorder: Secondary | ICD-10-CM | POA: Insufficient documentation

## 2024-03-15 MED ORDER — BUSPIRONE HCL 5 MG PO TABS: 5.0000 mg | ORAL_TABLET | Freq: Two times a day (BID) | ORAL | 1 refills | Status: DC

## 2024-03-15 MED ORDER — QUETIAPINE FUMARATE 25 MG PO TABS: ORAL_TABLET | ORAL | 0 refills | Status: DC

## 2024-03-15 NOTE — Progress Notes (Signed)
 [facility] MD OP Progress Note  03/15/2024 11:49 AM [name]  MRN:  [id number]  Chief Complaint:  Chief Complaint  Patient presents with   Follow-up   Anxiety   Depression   Medication Refill   Discussed the use of AI scribe software for clinical note transcription with the patient, who gave verbal consent to proceed.  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  Patient presented along with his wife and caregiver who provided collateral information.  Patient is a limited historian.  His caregiver reports that he has experienced significant anxiety, particularly when left alone, and notes that this has worsened over time. She describes that he becomes distressed even if she leaves the room briefly, such as to use the bathroom, and he will call out for her. He states that he feels lonely when his caregiver is away and frequently calls her when she runs errands, sometimes multiple times during short absences. His caregiver notes that he also calls other family members or friends when she is not present. He has not succeeded in engaging in independent activities, such as reading, coloring, or using his phone for entertainment, although he previously enjoyed reading newspapers. He reports that he listens to the news daily on television. His caregiver expresses concern about the impact of his anxiety on her ability to leave the house and on her own well-being. She also describes financial barriers to accessing therapy and day programs, and notes that she has made efforts to seek these services.  Regarding psychotic symptoms, his caregiver  reports that he continues to occasionally talk about bugs crawling on him, referencing bed bugs, but she states that these experiences do not affect him on a daily basis. She denies that these perceptual disturbances are a persistent or distressing issue at this time.  Sleep is overall good although interrupted at times.  He currently takes oxcarbazepine  300 mg twice daily, Seroquel  25 mg in the morning (with additional doses at night only if he is very anxious).  Patient denies any suicidality, homicidality.  Patient appeared to be alert, oriented to person place time situation. And 3 word memory immediate 3 out of 3 after 5 minutes 2 out of 3.  Attention and focus seem to be limited, was unable to do digits forward and backward.    Visit Diagnosis:    ICD-10-CM   1. Bipolar disorder, in full remission, most recent episode depressed (HCC)  F31.76     2. GAD (generalized anxiety disorder)  F41.1 busPIRone  (BUSPAR ) 5 MG tablet    QUEtiapine  (SEROQUEL ) 25 MG tablet    3. Insomnia due to medical condition  G47.01    Mood disorder    4. 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  Past Medical History:  Past Medical History:  Diagnosis Date   Acute exacerbation of CHF (congestive heart failure) (HCC)    Anemia    Bipolar disorder (HCC)    Bladder mass    Cellulitis    Chronic anticoagulation    Chronic indwelling Foley catheter    CKD (chronic kidney disease) stage  3, GFR 30-59 ml/min (HCC)    Colostomy in place [facility])    Diabetes mellitus without complication (HCC)    Frontotemporal dementia (HCC)    Heel ulcer (HCC)    Left   Hematuria    History of blood clots    Hydronephrosis of right kidney    Hyperkalemia    Hypertension    Hyponatremia    Lithium  toxicity    Metabolic acidosis    Neurogenic bladder    Obesity     Sacral decubitus ulcer    Sepsis (HCC)    TBI (traumatic brain injury) (HCC)    UTI (urinary tract infection)    VRE (vancomycin  resistant enterococcus) culture positive     Past Surgical History:  Procedure Laterality Date   BACK SURGERY     CARPAL TUNNEL RELEASE Bilateral    COLON SURGERY     COLOSTOMY     CYSTOSCOPY W/ RETROGRADES Right 10/25/2022   Procedure: CYSTOSCOPY WITH RETROGRADE PYELOGRAM;  Surgeon: Francisca Redell BROCKS, MD;  Location: [facility] ORS;  Service: Urology;  Laterality: Right;   IR CYSTOSTOMY TUBE PLACEMENT/BLADDER ASPIRATION  12/12/2023   IR PERC TUN PERIT CATH WO PORT S&I /IMAG  10/11/2021   IR REMOVAL TUN CV CATH W/O FL  11/19/2021   IR US  GUIDE VASC ACCESS RIGHT  10/11/2021   SACRAL DECUBITUS ULCER EXCISION     TONSILLECTOMY      Family Psychiatric History: I have reviewed family psychiatric history from progress note on 09/09/2017.  Family History:  Family History  Problem Relation Age of Onset   Depression Father    Deep vein thrombosis Father     Social History: I have reviewed social history from progress note on 09/09/2017. Social History   Socioeconomic History   Marital status: Married    Spouse name: thelma   Number of children: 0   Years of education: Not on file   Highest education level: Associate degree: occupational, Scientist, product/process development, or vocational program  Occupational History   Not on file  Tobacco Use   Smoking status: Never    Passive exposure: Never   Smokeless tobacco: Never  Vaping Use   Vaping status: Never Used  Substance and Sexual Activity   Alcohol use: Not Currently   Drug use: No   Sexual activity: Not Currently  Other Topics Concern   Not on file  Social History Narrative   Not on file   Social Drivers of Health   Financial Resource Strain: Low Risk  (12/31/2020)   [facility]   Overall Financial Resource Strain (CARDIA)    Difficulty of Paying Living Expenses: Not very hard  Food Insecurity: Patient  Declined (09/16/2023)   Hunger Vital Sign    Worried About Running Out of Food in the Last Year: Patient declined    Ran Out of Food in the Last Year: Patient declined  Transportation Needs: Patient Declined (09/16/2023)   PRAPARE - Administrator, Civil Service (Medical): Patient declined    Lack of Transportation (Non-Medical): Patient declined  Physical Activity: Inactive (09/09/2017)   Exercise Vital Sign    Days of Exercise per Week: 0 days    Minutes of Exercise per Session: 0 min  Stress: No Stress Concern Present (09/09/2017)   [employer] of Occupational Health - Occupational Stress Questionnaire    Feeling of Stress : Not at all  Social Connections: Unknown (09/17/2023)   Social Connection and Isolation Panel    Frequency of  Communication with Friends and Family: Patient declined    Frequency of Social Gatherings with Friends and Family: Patient declined    Attends Religious Services: Patient declined    Database administrator or Organizations: Patient declined    Attends Engineer, structural: Patient declined    Marital Status: Married    Allergies: No Known Allergies  Metabolic Disorder Labs: Lab Results  Component Value Date   HGBA1C 5.9 (H) 07/19/2023   MPG 122.63 07/19/2023   MPG 154 09/19/2022   No results found for: PROLACTIN Lab Results  Component Value Date   CHOL 141 04/14/2021   TRIG 45 04/14/2021   HDL 44 04/14/2021   CHOLHDL 3.2 04/14/2021   VLDL 9 04/14/2021   LDLCALC 88 04/14/2021   Lab Results  Component Value Date   TSH 2.462 07/29/2021   TSH 2.175 04/14/2021    Therapeutic Level Labs: Lab Results  Component Value Date   LITHIUM  1.24 (H) 01/04/2016   LITHIUM  1.51 (HH) 01/03/2016   No results found for: VALPROATE No results found for: CBMZ  Current Medications: Current Outpatient Medications  Medication Sig Dispense Refill   amLODipine -olmesartan  (AZOR ) 5-20 MG tablet Take 1 tablet by mouth daily.      busPIRone  (BUSPAR ) 5 MG tablet Take 1 tablet (5 mg total) by mouth 2 (two) times daily. Take daily at noon and at 4 PM 60 tablet 1   Continuous Blood Gluc Receiver (DEXCOM G7 RECEIVER) DEVI UAD for blood sugar monitoring.     ELIQUIS  5 MG TABS tablet Take 5 mg by mouth 2 (two) times daily.      glipiZIDE  (GLUCOTROL  XL) 2.5 MG 24 hr tablet Take 2.5 mg by mouth daily.     memantine  (NAMENDA ) 10 MG tablet Take 10 mg by mouth 2 (two) times daily.     Oxcarbazepine  (TRILEPTAL ) 300 MG tablet Take 1 tablet (300 mg total) by mouth 2 (two) times daily. 180 tablet 0   rosuvastatin  (CRESTOR ) 20 MG tablet Take 20 mg by mouth at bedtime.     sodium zirconium cyclosilicate  (LOKELMA ) 10 g PACK packet Take 10 g by mouth.     vitamin B-12 (CYANOCOBALAMIN ) 1000 MCG tablet Take 1,000 mcg by mouth daily.     oxybutynin  (DITROPAN -XL) 10 MG 24 hr tablet Take 1 tablet (10 mg total) by mouth daily. (Patient not taking: Reported on 03/15/2024) 30 tablet 11   QUEtiapine  (SEROQUEL ) 25 MG tablet Take 1 tablet (25 mg total) by mouth at bedtime. May also take 1 tablet (25 mg total) daily as needed (for anxiety, agitation). 180 tablet 0   No current facility-administered medications for this visit.     Musculoskeletal: Strength & Muscle Tone: WNL Gait & Station: in a wheelchair Patient leans: N/A  Psychiatric Specialty Exam: Review of Systems  Psychiatric/Behavioral:  Positive for decreased concentration. The patient is nervous/anxious.     Blood pressure 138/76, pulse 69, temperature 97.6 F (36.4 C), temperature source Temporal, height 5' 9 (1.753 m), weight 243 lb (110.2 kg).Body mass index is 35.88 kg/m.  General Appearance: Casual  Eye Contact:  Fair  Speech:  Clear and Coherent  Volume:  Normal  Mood:  Anxious  Affect:  Congruent  Thought Process:  Goal Directed and Descriptions of Associations: Intact  Orientation:  Full (Time, Place, and Person)  Thought Content: Logical   Suicidal Thoughts:  No   Homicidal Thoughts:  No  Memory:  Immediate;   Fair Recent;   limited Remote;  limited  Judgement:  Fair  Insight:  Shallow  Psychomotor Activity:  Tremor  Concentration:  Concentration: limited and Attention Span: limited  Recall:  Poor  Fund of Knowledge: Fair  Language: Fair  Akathisia:  No  Handed:  Right  AIMS (if indicated): done  Assets:  Communication Skills Desire for Improvement Housing Social Support Transportation  ADL's:  Needs support with ADLs  Cognition: Impaired,  Mild FTD  Sleep:  Fair   Screenings: Geneticist, molecular Office Visit from 03/15/2024 in [facility] Office Visit from 12/04/2022 in [facility] Visit from 02/27/2022 in [facility] Office Visit from 03/10/2018 in [facility]  AIMS Total Score 0 0 0 0   GAD-7    Flowsheet Row Office Visit from 03/15/2024 in [facility] Office Visit from 12/04/2022 in [facility] Office Visit from 02/27/2022 in [facility]  Total GAD-7 Score 15 11 2    PHQ2-9    Flowsheet Row Office Visit from 03/15/2024 in [facility] Office Visit from 10/07/2023 in [facility] Office Visit from 12/04/2022 in [facility] Office Visit from 06/04/2022 in [facility] Office Visit from 02/27/2022 in [facility]  PHQ-2 Total Score 2 5 2 1 1   PHQ-9 Total Score -- 13 7 1  --   Flowsheet Row Office Visit from 03/15/2024 in [facility] Office Visit from 10/07/2023 in [facility] ED from  09/21/2023 in [facility] at [facility]  C-SSRS RISK CATEGORY No Risk No Risk No Risk     Assessment and Plan: Bilal Manzer is a 68 year old African-American male with history of bipolar disorder type II, diabetes mellitus, chronic kidney disease stage IIIb, congestive heart failure and multiple other medical problem was evaluated in office today.  Discussed assessment and plan as noted below.  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 Continue follow-up with neurologist.  Bipolar disorder type II most recent episode depressed in remission Currently denies any significant depression or manic symptoms Continue Trileptal  300 mg twice daily Continue Seroquel  25 mg at bedtime and 25 mg during the day as needed.  Generalized anxiety disorder-unstable Continues to have significant anxiety, fear of being left alone likely also due to his FTD related behavioral changes Start BuSpar  5 mg twice daily Encouraged to join a day program like [facility].  Caregiver agrees to get in touch with them. Encouraged to restart psychotherapy sessions.  Insomnia-improving Currently reports sleep is overall good. Continue sleep hygiene techniques Continue Seroquel  25 mg at bedtime as needed. Currently uses the bedtime dosage only as needed and does not use it every night.   I have reviewed most recent labs including platelet count dated 03/04/2024-193, hemoglobin A1c-5.7-elevated borderline, sodium level 139 dated 01/26/2024. Patient with potassium level abnormalities currently under the care of his provider who is managing it.  Collateral information obtained from wife as noted above.  Follow-up Follow-up in clinic in 8 to 10 weeks or sooner if needed.  Collaboration of Care: Collaboration of Care: Referral or follow-up with counselor/therapist AEB encouraged to reestablish care with the  therapist.  Patient/Guardian was advised Release of Information must be obtained prior to  any record release in order to collaborate their care with an outside provider. Patient/Guardian was advised if they have not already done so to contact the registration department to sign all necessary forms in order for us  to release information regarding their care.   Consent: Patient/Guardian gives verbal consent for treatment and assignment of benefits for services provided during this visit. Patient/Guardian expressed understanding and agreed to proceed.  This note was generated in part or whole with voice recognition software. Voice recognition is usually quite accurate but there are transcription errors that can and very often do occur. I apologize for any typographical errors that were not detected and corrected.     Quincee Gittens, MD 03/15/2024, 11:49 AM

## 2024-03-15 NOTE — Patient Instructions (Signed)
 Buspirone  Tablets What is this medication? BUSPIRONE  (byoo SPYE rone) treats anxiety. It works by balancing the levels of dopamine and serotonin in your brain, substances that help regulate mood. This medicine may be used for other purposes; ask your health care provider or pharmacist if you have questions. COMMON BRAND NAME(S): BuSpar , Buspar  Dividose What should I tell my care team before I take this medication? They need to know if you have any of these conditions: Kidney disease Liver disease An unusual or allergic reaction to buspirone , other medications, foods, dyes, or preservatives Pregnant or trying to get pregnant Breastfeeding How should I use this medication? Take this medication by mouth with water . Take it as directed on the prescription label. You can take it with or without food. You should always take it the same way. Keep taking it unless your care team tells you to stop. Do not take this medication with foods or drinks that contain grapefruit. Talk to your care team about the use of this medication in children. Special care may be needed. Overdosage: If you think you have taken too much of this medicine contact a poison control center or emergency room at once. NOTE: This medicine is only for you. Do not share this medicine with others. What if I miss a dose? If you miss a dose, take it as soon as you can. If it is almost time for your next dose, take only that dose. Do not take double or extra doses. What may interact with this medication? Do not take this medication with any of the following: Linezolid MAOIs like Carbex, Eldepryl, Marplan, Nardil, and Parnate Methylene blue Procarbazine This medication may also interact with the following: Alcohol Diazepam Digoxin Droperidol Grapefruit juice Haloperidol Metoclopramide Opioids Phenothiazines, such as chlorpromazine, prochlorperazine, thioridazine Some medications for depression, anxiety, or other mental health  conditions Some medication for migraines, such as sumatriptan Stimulant medications for ADHD, weight loss, or staying awake Supplements, such as St. John's wort or tryptophan Tetrabenazine Other medications may affect the way this medication works. Talk with your care team about all the medications you take. They may suggest changes to your treatment plan to lower the risk of side effects and to make sure your medications work as intended. This list may not describe all possible interactions. Give your health care provider a list of all the medicines, herbs, non-prescription drugs, or dietary supplements you use. Also tell them if you smoke, drink alcohol, or use illegal drugs. Some items may interact with your medicine. What should I watch for while using this medication? Visit your care team for regular checks on your progress. It may take 1 to 2 weeks before your anxiety gets better. This medication may affect your coordination, reaction time, or judgment. Do not drive or operate machinery until you know how this medication affects you. Sit up or stand slowly to reduce the risk of dizzy or fainting spells. Drinking alcohol with this medication can increase the risk of these side effects. Serotonin syndrome is when your body has too much serotonin in it. This happens when this medication is used with other ones that increase serotonin levels. Common medications that increase serotonin levels are antidepressants, some medications for migraines, and some antibiotics. The symptoms of serotonin syndrome include irritability, confusion, fast or irregular heartbeat, muscle stiffness, twitching muscles, sweating, high fever, seizure, chills, vomiting and diarrhea. Contact your care team right away if you think you have serotonin syndrome. Talk to your care team about this medication if  you are breastfeeding. There are benefits and risks to taking medications while breastfeeding. Your care team can help you  find the option that works for you. What side effects may I notice from receiving this medication? Side effects that you should report to your care team as soon as possible: Allergic reactions--skin rash, itching, hives, swelling of the face, lips, tongue, or throat Irritability, confusion, fast or irregular heartbeat, muscle stiffness, twitching muscles, sweating, high fever, seizure, chills, vomiting, diarrhea, which may be signs of serotonin syndrome Side effects that usually do not require medical attention (report to your care team if they continue or are bothersome): Anxiety, nervousness Dizziness Drowsiness Headache Nausea Trouble sleeping This list may not describe all possible side effects. Call your doctor for medical advice about side effects. You may report side effects to FDA at 1-800-FDA-1088. Where should I keep my medication? Keep out of reach of children and pets. Store at room temperature between 20 and 25 degrees C (68 and 77 degrees F). Get rid of any unused medication after the expiration date. To get rid of medications that are no longer needed or expired: Take the medication to a take-back program. Check with your pharmacy or law enforcement to find a location. If you cannot return the medication, check the label or package insert to see if the medication should be thrown out in the garbage or flushed down the toilet. If you are not sure, ask your care team. If it is safe to put it in the trash, empty the medication out of the container. Mix it with cat litter, dirt, coffee grounds, or another unwanted substance. Seal the mixture in a bag or container. Put it in the trash. NOTE: This sheet is a summary. It may not cover all possible information. If you have questions about this medicine, talk to your doctor, pharmacist, or health care provider.  2025 Elsevier/Gold Standard (2023-11-25 00:00:00)

## 2024-03-22 ENCOUNTER — Encounter: Admitting: Physician Assistant

## 2024-03-22 DIAGNOSIS — L89154 Pressure ulcer of sacral region, stage 4: Secondary | ICD-10-CM | POA: Diagnosis not present

## 2024-03-23 ENCOUNTER — Ambulatory Visit: Admitting: Physician Assistant

## 2024-03-25 ENCOUNTER — Other Ambulatory Visit: Payer: Self-pay

## 2024-03-25 DIAGNOSIS — D631 Anemia in chronic kidney disease: Secondary | ICD-10-CM

## 2024-03-26 ENCOUNTER — Inpatient Hospital Stay (HOSPITAL_BASED_OUTPATIENT_CLINIC_OR_DEPARTMENT_OTHER): Admitting: Oncology

## 2024-03-26 ENCOUNTER — Inpatient Hospital Stay

## 2024-03-26 ENCOUNTER — Encounter: Payer: Self-pay | Admitting: Oncology

## 2024-03-26 ENCOUNTER — Inpatient Hospital Stay: Attending: Oncology

## 2024-03-26 VITALS — BP 104/59 | HR 65 | Temp 97.6°F | Resp 18 | Ht 69.0 in

## 2024-03-26 DIAGNOSIS — Z79899 Other long term (current) drug therapy: Secondary | ICD-10-CM | POA: Diagnosis not present

## 2024-03-26 DIAGNOSIS — D631 Anemia in chronic kidney disease: Secondary | ICD-10-CM

## 2024-03-26 DIAGNOSIS — N183 Chronic kidney disease, stage 3 unspecified: Secondary | ICD-10-CM | POA: Diagnosis present

## 2024-03-26 DIAGNOSIS — N189 Chronic kidney disease, unspecified: Secondary | ICD-10-CM

## 2024-03-26 DIAGNOSIS — I130 Hypertensive heart and chronic kidney disease with heart failure and stage 1 through stage 4 chronic kidney disease, or unspecified chronic kidney disease: Secondary | ICD-10-CM | POA: Diagnosis present

## 2024-03-26 LAB — CBC WITH DIFFERENTIAL (CANCER CENTER ONLY)
Abs Immature Granulocytes: 0.01 K/uL (ref 0.00–0.07)
Basophils Absolute: 0.1 K/uL (ref 0.0–0.1)
Basophils Relative: 1 %
Eosinophils Absolute: 0.5 K/uL (ref 0.0–0.5)
Eosinophils Relative: 10 %
HCT: 32.1 % — ABNORMAL LOW (ref 39.0–52.0)
Hemoglobin: 9.8 g/dL — ABNORMAL LOW (ref 13.0–17.0)
Immature Granulocytes: 0 %
Lymphocytes Relative: 22 %
Lymphs Abs: 1 K/uL (ref 0.7–4.0)
MCH: 26.1 pg (ref 26.0–34.0)
MCHC: 30.5 g/dL (ref 30.0–36.0)
MCV: 85.4 fL (ref 80.0–100.0)
Monocytes Absolute: 0.2 K/uL (ref 0.1–1.0)
Monocytes Relative: 5 %
Neutro Abs: 3 K/uL (ref 1.7–7.7)
Neutrophils Relative %: 62 %
Platelet Count: 201 K/uL (ref 150–400)
RBC: 3.76 MIL/uL — ABNORMAL LOW (ref 4.22–5.81)
RDW: 19 % — ABNORMAL HIGH (ref 11.5–15.5)
WBC Count: 4.8 K/uL (ref 4.0–10.5)
nRBC: 0 % (ref 0.0–0.2)

## 2024-03-26 LAB — CMP (CANCER CENTER ONLY)
ALT: 12 U/L (ref 0–44)
AST: 21 U/L (ref 15–41)
Albumin: 3.1 g/dL — ABNORMAL LOW (ref 3.5–5.0)
Alkaline Phosphatase: 92 U/L (ref 38–126)
Anion gap: 7 (ref 5–15)
BUN: 51 mg/dL — ABNORMAL HIGH (ref 8–23)
CO2: 18 mmol/L — ABNORMAL LOW (ref 22–32)
Calcium: 8.4 mg/dL — ABNORMAL LOW (ref 8.9–10.3)
Chloride: 111 mmol/L (ref 98–111)
Creatinine: 2.54 mg/dL — ABNORMAL HIGH (ref 0.61–1.24)
GFR, Estimated: 27 mL/min — ABNORMAL LOW (ref >60)
Glucose, Bld: 132 mg/dL — ABNORMAL HIGH (ref 70–99)
Potassium: 5.6 mmol/L — ABNORMAL HIGH (ref 3.5–5.1)
Sodium: 136 mmol/L (ref 135–145)
Total Bilirubin: 0.4 mg/dL (ref 0.0–1.2)
Total Protein: 7.9 g/dL (ref 6.5–8.1)

## 2024-03-26 MED ORDER — EPOETIN ALFA-EPBX 40000 UNIT/ML IJ SOLN
40000.0000 [IU] | Freq: Once | INTRAMUSCULAR | Status: AC
  Administered 2024-03-26: 40000 [IU] via SUBCUTANEOUS
  Filled 2024-03-26: qty 1

## 2024-03-26 NOTE — Progress Notes (Signed)
 Patient is doing ok he says.

## 2024-03-26 NOTE — Progress Notes (Signed)
 [facility]  Telephone:(336) [phone] Fax:(336) [phone]  ID: [name] OB: 02/25/1956  MR#: [id number]  RDW#:[id number]  Patient Care Team: Susann Agent, MD as PCP - General (Internal Medicine) [name], MD as Consulting Physician (Oncology) [name], MD as Consulting Physician (Hematology and Oncology)  CHIEF COMPLAINT: Anemia secondary to renal insufficiency.  INTERVAL HISTORY: Patient returns to clinic today for repeat laboratory work, further evaluation, and continuation of Retacrit .  [name] continues to have chronic weakness and fatigue, but otherwise feels well.  [name] has no neurologic complaints.  [name] denies any recent fevers or illnesses.  [name] has a good appetite and denies weight loss.  [name] has no chest pain, shortness of breath, cough, or hemoptysis.  [name] denies any nausea, vomiting, constipation, or diarrhea.  [name] has no melena or hematochezia.  [name] has no urinary complaints.  Patient offers no further specific complaints today.  REVIEW OF SYSTEMS:   Review of Systems  Constitutional:  Positive for malaise/fatigue. Negative for fever and weight loss.  Respiratory: Negative.  Negative for cough, hemoptysis and shortness of breath.   Cardiovascular: Negative.  Negative for chest pain and leg swelling.  Gastrointestinal: Negative.  Negative for abdominal pain and blood in stool.  Genitourinary: Negative.  Negative for dysuria.  Musculoskeletal: Negative.  Negative for back pain.  Skin: Negative.  Negative for rash.  Neurological:  Positive for weakness. Negative for dizziness, focal weakness and headaches.  Psychiatric/Behavioral: Negative.  The patient is not nervous/anxious.     As per HPI. Otherwise, a complete review of systems is negative.  PAST MEDICAL HISTORY: Past Medical History:  Diagnosis Date   Acute exacerbation of CHF (congestive heart failure) (HCC)    Anemia    Bipolar disorder (HCC)    Bladder mass     Cellulitis    Chronic anticoagulation    Chronic indwelling Foley catheter    CKD (chronic kidney disease) stage 3, GFR 30-59 ml/min (HCC)    Colostomy in place (HCC)    Diabetes mellitus without complication (HCC)    Frontotemporal dementia (HCC)    Heel ulcer (HCC)    Left   Hematuria    History of blood clots    Hydronephrosis of right kidney    Hyperkalemia    Hypertension    Hyponatremia    Lithium  toxicity    Metabolic acidosis    Neurogenic bladder    Obesity    Sacral decubitus ulcer    Sepsis (HCC)    TBI (traumatic brain injury) (HCC)    UTI (urinary tract infection)    VRE (vancomycin  resistant enterococcus) culture positive     PAST SURGICAL HISTORY: Past Surgical History:  Procedure Laterality Date   BACK SURGERY     CARPAL TUNNEL RELEASE Bilateral    COLON SURGERY     COLOSTOMY     CYSTOSCOPY W/ RETROGRADES Right 10/25/2022   Procedure: CYSTOSCOPY WITH RETROGRADE PYELOGRAM;  Surgeon: Francisca Redell BROCKS, MD;  Location: [facility] ORS;  Service: Urology;  Laterality: Right;   IR CYSTOSTOMY TUBE PLACEMENT/BLADDER ASPIRATION  12/12/2023   IR PERC TUN PERIT CATH WO PORT S&I /IMAG  10/11/2021   IR REMOVAL TUN CV CATH W/O FL  11/19/2021   IR US  GUIDE VASC ACCESS RIGHT  10/11/2021   SACRAL DECUBITUS ULCER EXCISION     TONSILLECTOMY      FAMILY HISTORY: Family History  Problem Relation Age of Onset   Depression Father    Deep vein thrombosis Father  ADVANCED DIRECTIVES (Y/N):  N  HEALTH MAINTENANCE: Social History   Tobacco Use   Smoking status: Never    Passive exposure: Never   Smokeless tobacco: Never  Vaping Use   Vaping status: Never Used  Substance Use Topics   Alcohol use: Not Currently   Drug use: No     Colonoscopy:  PAP:  Bone density:  Lipid panel:  No Known Allergies  Current Outpatient Medications  Medication Sig Dispense Refill   amLODipine -olmesartan  (AZOR ) 5-20 MG tablet Take 1 tablet by mouth daily.     busPIRone  (BUSPAR ) 5 MG  tablet Take 1 tablet (5 mg total) by mouth 2 (two) times daily. Take daily at noon and at 4 PM 60 tablet 1   Continuous Blood Gluc Receiver (DEXCOM G7 RECEIVER) DEVI UAD for blood sugar monitoring.     ELIQUIS  5 MG TABS tablet Take 5 mg by mouth 2 (two) times daily.      glipiZIDE  (GLUCOTROL  XL) 2.5 MG 24 hr tablet Take 2.5 mg by mouth daily.     memantine  (NAMENDA ) 10 MG tablet Take 10 mg by mouth 2 (two) times daily.     Oxcarbazepine  (TRILEPTAL ) 300 MG tablet Take 1 tablet (300 mg total) by mouth 2 (two) times daily. 180 tablet 0   QUEtiapine  (SEROQUEL ) 25 MG tablet Take 1 tablet (25 mg total) by mouth at bedtime. May also take 1 tablet (25 mg total) daily as needed (for anxiety, agitation). 180 tablet 0   rosuvastatin  (CRESTOR ) 20 MG tablet Take 20 mg by mouth at bedtime.     sodium zirconium cyclosilicate  (LOKELMA ) 10 g PACK packet Take 10 g by mouth.     vitamin B-12 (CYANOCOBALAMIN ) 1000 MCG tablet Take 1,000 mcg by mouth daily.     oxybutynin  (DITROPAN -XL) 10 MG 24 hr tablet Take 1 tablet (10 mg total) by mouth daily. (Patient not taking: Reported on 03/26/2024) 30 tablet 11   No current facility-administered medications for this visit.   Facility-Administered Medications Ordered in Other Visits  Medication Dose Route Frequency Provider Last Rate Last Admin   epoetin  alfa-epbx (RETACRIT ) injection 40,000 Units  40,000 Units Subcutaneous Once Adrielle Polakowski J, MD        OBJECTIVE: Vitals:   03/26/24 1054  BP: (!) 104/59  Pulse: 65  Resp: 18  Temp: 97.6 F (36.4 C)  SpO2: 100%     Body mass index is 35.88 kg/m.    ECOG FS:1 - Symptomatic but completely ambulatory  General: Well-developed, well-nourished, no acute distress.  Sitting in a wheelchair. Eyes: Pink conjunctiva, anicteric sclera. HEENT: Normocephalic, moist mucous membranes. Lungs: No audible wheezing or coughing. Heart: Regular rate and rhythm. Abdomen: Soft, nontender, no obvious distention. Musculoskeletal:  No edema, cyanosis, or clubbing. Neuro: Alert, answering all questions appropriately. Cranial nerves grossly intact. Skin: No rashes or petechiae noted. Psych: Normal affect.  LAB RESULTS:  Lab Results  Component Value Date   NA 136 03/26/2024   K 5.6 (H) 03/26/2024   CL 111 03/26/2024   CO2 18 (L) 03/26/2024   GLUCOSE 132 (H) 03/26/2024   BUN 51 (H) 03/26/2024   CREATININE 2.54 (H) 03/26/2024   CALCIUM  8.4 (L) 03/26/2024   PROT 7.9 03/26/2024   ALBUMIN 3.1 (L) 03/26/2024   AST 21 03/26/2024   ALT 12 03/26/2024   ALKPHOS 92 03/26/2024   BILITOT 0.4 03/26/2024   GFRNONAA 27 (L) 03/26/2024   GFRAA >60 03/22/2020    Lab Results  Component Value Date  WBC 4.8 03/26/2024   NEUTROABS 3.0 03/26/2024   HGB 9.8 (L) 03/26/2024   HCT 32.1 (L) 03/26/2024   MCV 85.4 03/26/2024   PLT 201 03/26/2024   Lab Results  Component Value Date   IRON  37 (L) 11/25/2023   TIBC 188 (L) 11/25/2023   IRONPCTSAT 20 11/25/2023   Lab Results  Component Value Date   FERRITIN 27 11/25/2023     STUDIES: No results found.  ASSESSMENT: Anemia secondary to renal insufficiency  PLAN:    Anemia secondary to renal insufficiency: Patient's hemoglobin has improved but remains decreased at 9.8 today.  Previously, other than a mildly decreased total iron  level, all of his other laboratory work was either negative or within normal limits.  Proceed with 40,000 units Retacrit  today.  Return to clinic in 1, 2, 3 months for laboratory work and continuation of Retacrit  if his hemoglobin remains below 10.0.  Patient was then return to clinic in 4 months with repeat laboratory, further evaluation, and continuation of treatment if needed. Renal insufficiency: Chronic and unchanged.  Continue follow-up with nephrology as indicated.  I spent a total of 30 minutes reviewing chart data, face-to-face evaluation with the patient, counseling and coordination of care as detailed above.    Patient expressed  understanding and was in agreement with this plan. [name] also understands that [name] can call clinic at any time with any questions, concerns, or complaints.     Evalene JINNY Reusing, MD   03/26/2024 11:34 AM

## 2024-03-29 NOTE — Progress Notes (Unsigned)
 Urological history: 1. Neurogenic bladder -Since early 2000 secondary to TBI -Managed by indwelling Foley   2. High risk  hematuria  -non-smoker -non contrast CT (01/2023) - mild hydronephrosis -cysto/right retrograde (09/2022) -J hooking of right distal ureter, no tumors and wide open prostatic fossa from the prior TURP  Suprapubic Cath Change  Patient is present today for a suprapubic catheter change due to urinary retention.  8 ml of water was drained from the balloon, a 14 FR foley cath was removed from the tract with out difficulty.  Site was cleaned and prepped in a sterile fashion with betadine.  A 14 FR foley cath was replaced into the tract no complications were noted. Urine return was noted, 10 ml of sterile water was inflated into the balloon and a night bag was attached for drainage.  Patient tolerated well.   Performed by: Anothony Bursch, PA-C and Nya E Bynum, CMA    Follow up: Return in about 1 month (around 04/29/2024) for SPT exhcange .   Mr. Peters expressed that his SPT site has been sore since it has been placed.  On examination, it appears that the catheter tubing is pulled to the right creating a depression in the SPT site and it looks like the tubing is rubbing against that area which may be the source of his discomfort.  I put the StatLock on the inner portion of the thigh and to pull the cathing tubing away from that area, so that I can heal.

## 2024-03-30 ENCOUNTER — Encounter: Payer: Self-pay | Admitting: Urology

## 2024-03-30 ENCOUNTER — Ambulatory Visit: Admitting: Urology

## 2024-03-30 VITALS — BP 144/82 | HR 72 | Ht 66.0 in | Wt 279.0 lb

## 2024-03-30 DIAGNOSIS — N319 Neuromuscular dysfunction of bladder, unspecified: Secondary | ICD-10-CM | POA: Diagnosis not present

## 2024-04-08 ENCOUNTER — Other Ambulatory Visit: Payer: Self-pay | Admitting: Psychiatry

## 2024-04-08 DIAGNOSIS — F3181 Bipolar II disorder: Secondary | ICD-10-CM

## 2024-04-08 DIAGNOSIS — G4701 Insomnia due to medical condition: Secondary | ICD-10-CM

## 2024-04-08 DIAGNOSIS — F411 Generalized anxiety disorder: Secondary | ICD-10-CM

## 2024-04-12 ENCOUNTER — Encounter: Attending: Physician Assistant | Admitting: Physician Assistant

## 2024-04-12 DIAGNOSIS — L97822 Non-pressure chronic ulcer of other part of left lower leg with fat layer exposed: Secondary | ICD-10-CM | POA: Insufficient documentation

## 2024-04-12 DIAGNOSIS — M6281 Muscle weakness (generalized): Secondary | ICD-10-CM | POA: Diagnosis not present

## 2024-04-12 DIAGNOSIS — L89154 Pressure ulcer of sacral region, stage 4: Secondary | ICD-10-CM | POA: Insufficient documentation

## 2024-04-12 DIAGNOSIS — F319 Bipolar disorder, unspecified: Secondary | ICD-10-CM | POA: Insufficient documentation

## 2024-04-12 DIAGNOSIS — E11622 Type 2 diabetes mellitus with other skin ulcer: Secondary | ICD-10-CM | POA: Insufficient documentation

## 2024-04-12 DIAGNOSIS — Z7901 Long term (current) use of anticoagulants: Secondary | ICD-10-CM | POA: Insufficient documentation

## 2024-04-12 DIAGNOSIS — M8668 Other chronic osteomyelitis, other site: Secondary | ICD-10-CM | POA: Insufficient documentation

## 2024-04-12 DIAGNOSIS — I10 Essential (primary) hypertension: Secondary | ICD-10-CM | POA: Insufficient documentation

## 2024-04-12 DIAGNOSIS — Z87820 Personal history of traumatic brain injury: Secondary | ICD-10-CM | POA: Diagnosis not present

## 2024-04-26 ENCOUNTER — Inpatient Hospital Stay: Attending: Oncology

## 2024-04-26 ENCOUNTER — Inpatient Hospital Stay

## 2024-04-27 NOTE — Progress Notes (Unsigned)
Error

## 2024-04-29 ENCOUNTER — Ambulatory Visit: Admitting: Urology

## 2024-04-29 DIAGNOSIS — N319 Neuromuscular dysfunction of bladder, unspecified: Secondary | ICD-10-CM

## 2024-05-03 ENCOUNTER — Encounter: Attending: Physician Assistant | Admitting: Physician Assistant

## 2024-05-24 ENCOUNTER — Encounter: Admitting: Physician Assistant

## 2024-05-26 ENCOUNTER — Inpatient Hospital Stay: Attending: Oncology

## 2024-05-26 ENCOUNTER — Ambulatory Visit: Admitting: Urology

## 2024-05-26 ENCOUNTER — Inpatient Hospital Stay

## 2024-05-26 VITALS — BP 136/75 | HR 73

## 2024-05-26 DIAGNOSIS — D631 Anemia in chronic kidney disease: Secondary | ICD-10-CM | POA: Insufficient documentation

## 2024-05-26 DIAGNOSIS — N183 Chronic kidney disease, stage 3 unspecified: Secondary | ICD-10-CM | POA: Insufficient documentation

## 2024-05-26 DIAGNOSIS — I130 Hypertensive heart and chronic kidney disease with heart failure and stage 1 through stage 4 chronic kidney disease, or unspecified chronic kidney disease: Secondary | ICD-10-CM | POA: Diagnosis present

## 2024-05-26 DIAGNOSIS — N319 Neuromuscular dysfunction of bladder, unspecified: Secondary | ICD-10-CM

## 2024-05-26 LAB — CBC WITH DIFFERENTIAL/PLATELET
Abs Immature Granulocytes: 0.03 K/uL (ref 0.00–0.07)
Basophils Absolute: 0.1 K/uL (ref 0.0–0.1)
Basophils Relative: 1 %
Eosinophils Absolute: 0.4 K/uL (ref 0.0–0.5)
Eosinophils Relative: 8 %
HCT: 34 % — ABNORMAL LOW (ref 39.0–52.0)
Hemoglobin: 10.6 g/dL — ABNORMAL LOW (ref 13.0–17.0)
Immature Granulocytes: 1 %
Lymphocytes Relative: 27 %
Lymphs Abs: 1.4 K/uL (ref 0.7–4.0)
MCH: 26.2 pg (ref 26.0–34.0)
MCHC: 31.2 g/dL (ref 30.0–36.0)
MCV: 84.2 fL (ref 80.0–100.0)
Monocytes Absolute: 0.3 K/uL (ref 0.1–1.0)
Monocytes Relative: 6 %
Neutro Abs: 3 K/uL (ref 1.7–7.7)
Neutrophils Relative %: 57 %
Platelets: 165 K/uL (ref 150–400)
RBC: 4.04 MIL/uL — ABNORMAL LOW (ref 4.22–5.81)
RDW: 18 % — ABNORMAL HIGH (ref 11.5–15.5)
WBC: 5.3 K/uL (ref 4.0–10.5)
nRBC: 0 % (ref 0.0–0.2)

## 2024-05-26 NOTE — Progress Notes (Signed)
 Hemoglobin at 10.6, per parameters hold at 10.0.

## 2024-05-26 NOTE — Progress Notes (Signed)
 Suprapubic Cath Change  Patient is present today for a suprapubic catheter change due to urinary retention.  7 ml of water was drained from the balloon, a 16 FR foley cath was removed from the tract with out difficulty.  Site was cleaned and prepped in a sterile fashion with betadine.  A 16 FR foley cath was replaced into the tract no complications were noted. Urine return was noted, 10 ml of sterile water was inflated into the balloon and a leg bag was attached for drainage.  Patient tolerated well. A night bag was given to patient and proper instruction was given on how to switch bags.    Performed by: CLOTILDA CORNWALL, PA-C and Andrea DELENA Kirks, LPN   Follow up: Return in about 1 month (around 06/25/2024) for SPT exchange .

## 2024-05-31 ENCOUNTER — Other Ambulatory Visit: Payer: Self-pay

## 2024-05-31 ENCOUNTER — Encounter: Payer: Self-pay | Admitting: Psychiatry

## 2024-05-31 ENCOUNTER — Ambulatory Visit: Admitting: Psychiatry

## 2024-05-31 VITALS — BP 168/75 | HR 71 | Temp 97.6°F | Ht 66.0 in

## 2024-05-31 DIAGNOSIS — F3181 Bipolar II disorder: Secondary | ICD-10-CM | POA: Diagnosis not present

## 2024-05-31 DIAGNOSIS — G4701 Insomnia due to medical condition: Secondary | ICD-10-CM

## 2024-05-31 DIAGNOSIS — F411 Generalized anxiety disorder: Secondary | ICD-10-CM | POA: Diagnosis not present

## 2024-05-31 DIAGNOSIS — F0390 Unspecified dementia without behavioral disturbance: Secondary | ICD-10-CM | POA: Diagnosis not present

## 2024-05-31 MED ORDER — BUSPIRONE HCL 5 MG PO TABS: 5.0000 mg | ORAL_TABLET | Freq: Two times a day (BID) | ORAL | 1 refills | Status: AC

## 2024-05-31 MED ORDER — QUETIAPINE FUMARATE 25 MG PO TABS: ORAL_TABLET | ORAL | 1 refills | Status: AC

## 2024-05-31 NOTE — Progress Notes (Signed)
 [facility] MD OP Progress Note  05/31/2024 11:36 AM [name]  MRN:  [id number]  Chief Complaint:  Chief Complaint  Patient presents with   Medication Refill   Follow-up   Depression   Anxiety   Discussed the use of AI scribe software for clinical note transcription with the patient, who gave verbal consent to proceed.  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  Collateral information was obtained from wife, Gabriella who was present in session since patient is a limited historian.  Gabriella reports that he continues to experience anxiety, though she notes some improvement compared to previous visits. She describes his anxiety as still present, particularly related to being left alone, but less severe than before. He confirms that he is doing fine and manages to spend time by himself when necessary. Gabriella states that he does not call her as frequently as he used to when she is away.   Both he and [name] describe ongoing cognitive concerns. Gabriella notes that he previously read frequently but now shows little interest in reading books or using digital devices such as a tablet or computer, even though family members encourage him. He states that he is not very good at reading and prefers watching television. Gabriella observes that his concentration is limited when he attempts to read books, though he will read the newspaper if someone gives him one. Both he and Gabriella acknowledge that he does not engage in activities to exercise his brain, and Gabriella expresses concern about his lack of interest in cognitively stimulating activities.   However he appeared to be alert, oriented to person place time situation during the session.  3 word memory immediate 3 out of 3, after 5 minutes 2 out of 3.  Attention and focus seem to be limited.  Denies any suicidality, homicidality or perceptual disturbances.  He reports that he is sleeping well, and Gabriella confirms that he has no problems with sleep. Neither he nor [name] report any side effects or concerns related to his current medications.  He is currently compliant on the Seroquel , Trileptal , BuSpar .    Spent Thanksgiving with wife, godchildren, and grandchildren. Reports watching TV for fun. He has friends who check in on him.      Visit Diagnosis:    ICD-10-CM   1. Bipolar II disorder, mild, depressed, with anxious distress (HCC)  F31.81     2. GAD (generalized anxiety disorder)  F41.1 QUEtiapine  (SEROQUEL ) 25 MG tablet    busPIRone  (BUSPAR ) 5 MG tablet    3. Insomnia due to medical condition  G47.01    Mood disorder    4. 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  Past Medical History:  Past Medical History:  Diagnosis Date   Acute exacerbation of CHF (congestive heart failure) (HCC)    Anemia    Bipolar disorder (HCC)    Bladder mass    Cellulitis    Chronic anticoagulation    Chronic indwelling Foley catheter    CKD (chronic kidney disease) stage 3, GFR 30-59 ml/min (HCC)    Colostomy in place (  HCC)    Diabetes mellitus without complication (HCC)    Frontotemporal dementia (HCC)    Heel ulcer (HCC)    Left   Hematuria    History of blood clots    Hydronephrosis of right kidney    Hyperkalemia    Hypertension    Hyponatremia    Lithium  toxicity    Metabolic acidosis    Neurogenic bladder    Obesity    Sacral decubitus ulcer    Sepsis (HCC)    TBI (traumatic brain injury) (HCC)    UTI  (urinary tract infection)    VRE (vancomycin  resistant enterococcus) culture positive     Past Surgical History:  Procedure Laterality Date   BACK SURGERY     CARPAL TUNNEL RELEASE Bilateral    COLON SURGERY     COLOSTOMY     CYSTOSCOPY W/ RETROGRADES Right 10/25/2022   Procedure: CYSTOSCOPY WITH RETROGRADE PYELOGRAM;  Surgeon: Francisca Redell BROCKS, MD;  Location: [facility] ORS;  Service: Urology;  Laterality: Right;   IR CYSTOSTOMY TUBE PLACEMENT/BLADDER ASPIRATION  12/12/2023   IR PERC TUN PERIT CATH WO PORT S&I /IMAG  10/11/2021   IR REMOVAL TUN CV CATH W/O FL  11/19/2021   IR US  GUIDE VASC ACCESS RIGHT  10/11/2021   SACRAL DECUBITUS ULCER EXCISION     TONSILLECTOMY      Family Psychiatric History: I have reviewed family psychiatric history from progress note on 09/09/2017.  Family History:  Family History  Problem Relation Age of Onset   Depression Father    Deep vein thrombosis Father     Social History: I have reviewed social history from progress note on 09/09/2017. Social History   Socioeconomic History   Marital status: Married    Spouse name: [name]   Number of children: 0   Years of education: Not on file   Highest education level: Associate degree: occupational, scientist, product/process development, or vocational program  Occupational History   Not on file  Tobacco Use   Smoking status: Never    Passive exposure: Never   Smokeless tobacco: Never  Vaping Use   Vaping status: Never Used  Substance and Sexual Activity   Alcohol use: Not Currently   Drug use: No   Sexual activity: Not Currently  Other Topics Concern   Not on file  Social History Narrative   Not on file   Social Drivers of Health   Financial Resource Strain: Low Risk  (12/31/2020)   [facility] from [facility]   Overall Financial Resource Strain (CARDIA)    Difficulty of Paying Living Expenses: Not very hard  Food Insecurity: Patient Declined (09/16/2023)   Hunger Vital Sign    Worried About Running Out of Food in the  Last Year: Patient declined    Ran Out of Food in the Last Year: Patient declined  Transportation Needs: Patient Declined (09/16/2023)   PRAPARE - Administrator, Civil Service (Medical): Patient declined    Lack of Transportation (Non-Medical): Patient declined  Physical Activity: Inactive (09/09/2017)   Exercise Vital Sign    Days of Exercise per Week: 0 days    Minutes of Exercise per Session: 0 min  Stress: No Stress Concern Present (09/09/2017)   [employer] of Occupational Health - Occupational Stress Questionnaire    Feeling of Stress : Not at all  Social Connections: Unknown (09/17/2023)   Social Connection and Isolation Panel    Frequency of Communication with Friends and Family: Patient declined    Frequency  of Social Gatherings with Friends and Family: Patient declined    Attends Religious Services: Patient declined    Database Administrator or Organizations: Patient declined    Attends Engineer, Structural: Patient declined    Marital Status: Married    Allergies: No Known Allergies  Metabolic Disorder Labs: Lab Results  Component Value Date   HGBA1C 5.9 (H) 07/19/2023   MPG 122.63 07/19/2023   MPG 154 09/19/2022   No results found for: PROLACTIN Lab Results  Component Value Date   CHOL 141 04/14/2021   TRIG 45 04/14/2021   HDL 44 04/14/2021   CHOLHDL 3.2 04/14/2021   VLDL 9 04/14/2021   LDLCALC 88 04/14/2021   Lab Results  Component Value Date   TSH 2.462 07/29/2021   TSH 2.175 04/14/2021    Therapeutic Level Labs: Lab Results  Component Value Date   LITHIUM  1.24 (H) 01/04/2016   LITHIUM  1.51 (HH) 01/03/2016   No results found for: VALPROATE No results found for: CBMZ  Current Medications: Current Outpatient Medications  Medication Sig Dispense Refill   amLODipine -olmesartan  (AZOR ) 5-20 MG tablet Take 1 tablet by mouth daily.     busPIRone  (BUSPAR ) 5 MG tablet Take 1 tablet (5 mg total) by mouth 2 (two) times daily.  Take daily at noon and at 4 PM 180 tablet 1   Continuous Blood Gluc Receiver (DEXCOM G7 RECEIVER) DEVI UAD for blood sugar monitoring.     ELIQUIS  5 MG TABS tablet Take 5 mg by mouth 2 (two) times daily.      Ferrous Sulfate (IRON ) 325 (65 Fe) MG TABS Take 1 tablet by mouth daily.     glipiZIDE  (GLUCOTROL  XL) 2.5 MG 24 hr tablet Take 2.5 mg by mouth daily.     memantine  (NAMENDA ) 10 MG tablet Take 10 mg by mouth 2 (two) times daily.     Oxcarbazepine  (TRILEPTAL ) 300 MG tablet Take 1 tablet by mouth twice daily 180 tablet 1   QUEtiapine  (SEROQUEL ) 25 MG tablet Take 1 tablet (25 mg total) by mouth at bedtime. May also take 1 tablet (25 mg total) daily as needed (for anxiety, agitation). 180 tablet 1   rosuvastatin  (CRESTOR ) 20 MG tablet Take 20 mg by mouth at bedtime.     sodium zirconium cyclosilicate  (LOKELMA ) 10 g PACK packet Take 10 g by mouth.     vitamin B-12 (CYANOCOBALAMIN ) 1000 MCG tablet Take 1,000 mcg by mouth daily.     No current facility-administered medications for this visit.     Musculoskeletal: Strength & Muscle Tone: UTA  Gait & Station: In a wheelchair Patient leans: N/A  Psychiatric Specialty Exam: Review of Systems  Psychiatric/Behavioral:  The patient is nervous/anxious.     Blood pressure (!) 168/75, pulse 71, temperature 97.6 F (36.4 C), temperature source Temporal, height 5' 6 (1.676 m).Body mass index is 45.03 kg/m.  General Appearance: Casual  Eye Contact:  Fair  Speech:  Clear and Coherent  Volume:  Normal  Mood:  Anxious  Affect:  Appropriate  Thought Process:  Goal Directed and Descriptions of Associations: Intact  Orientation:  Full (Time, Place, and Person)  Thought Content: Logical   Suicidal Thoughts:  No  Homicidal Thoughts:  No  Memory:  Immediate;   Fair Recent;   Poor Remote;   Poor  Judgement:  Fair  Insight:  Fair  Psychomotor Activity:  Normal  Concentration:  Concentration: Limited and Attention Span: Limited  Recall: Limited   Fund of Knowledge: Fair  Language: Fair  Akathisia:  No  Handed:  Right  AIMS (if indicated): done  Assets:  Communication Skills Desire for Improvement Housing Social Support Transportation  ADL's:  Intact  Cognition: impaired , mild FTD  Sleep:  Fair   Screenings: Geneticist, Molecular Office Visit from 05/31/2024 in [facility] Visit from 03/15/2024 in [facility] Office Visit from 12/04/2022 in [facility] Office Visit from 02/27/2022 in [facility] Office Visit from 03/10/2018 in [facility]  AIMS Total Score 0 0 0 0 0   GAD-7    Flowsheet Row Office Visit from 05/31/2024 in [facility] Office Visit from 03/15/2024 in [facility] Office Visit from 12/04/2022 in [facility] Office Visit from 02/27/2022 in [facility]  Total GAD-7 Score 12 15 11 2    PHQ2-9    Flowsheet Row Office Visit from 05/31/2024 in [facility] Office Visit from 03/15/2024 in [facility] Office Visit from 10/07/2023 in [facility] Office Visit from 12/04/2022 in [facility] Office Visit from 06/04/2022 in [facility]  PHQ-2 Total Score 1 2 5 2 1   PHQ-9 Total Score -- -- 13 7 1    Flowsheet Row Office Visit from 05/31/2024 in [facility] Office Visit from 03/15/2024 in [facility] Office Visit from 10/07/2023 in [facility]  C-SSRS RISK  CATEGORY No Risk No Risk No Risk     Assessment and Plan: Seith Aikey is a 68 year old African-American male who presented for a follow-up appointment, discussed assessment and plan as noted below.  1. Bipolar II disorder, mild, depressed, with anxious distress (HCC) Currently denies any significant mood lability. Continue Trileptal  300 mg twice daily Continue Seroquel  25 mg daily at bedtime and 25 mg during the day as needed  2. GAD (generalized anxiety disorder)-improving  currently reports improvement in anxiety symptoms. Continue BuSpar  5 mg twice a day   3. Insomnia due to medical condition-improving Currently reports sleep is overall good. Continue sleep hygiene techniques Continue Seroquel  as prescribed  4. 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  Reviewed and discussed labs including sodium level dated 04/05/2024-137, platelet count-165-within normal limits.  Collateral information obtained from wife who was present in session as noted above.  Patient to follow-up with primary care provider regarding elevated blood pressure reading in session.  Follow-up Follow-up in clinic in 3 months or sooner if needed.    Consent: Patient/Guardian gives verbal consent for treatment and assignment of benefits for services provided during this visit. Patient/Guardian expressed understanding and agreed to proceed.   This note was generated in part or whole with voice recognition software. Voice recognition is usually quite accurate but there are transcription errors that can and very often do occur. I apologize for any typographical errors that were not detected and corrected.    [name] Perrault, MD 05/31/2024, 11:36 AM

## 2024-06-14 ENCOUNTER — Encounter: Admitting: Internal Medicine

## 2024-06-15 ENCOUNTER — Encounter: Attending: Internal Medicine | Admitting: Internal Medicine

## 2024-06-15 DIAGNOSIS — Z7901 Long term (current) use of anticoagulants: Secondary | ICD-10-CM | POA: Insufficient documentation

## 2024-06-15 DIAGNOSIS — L89154 Pressure ulcer of sacral region, stage 4: Secondary | ICD-10-CM | POA: Diagnosis not present

## 2024-06-15 DIAGNOSIS — F319 Bipolar disorder, unspecified: Secondary | ICD-10-CM | POA: Insufficient documentation

## 2024-06-15 DIAGNOSIS — Z87820 Personal history of traumatic brain injury: Secondary | ICD-10-CM | POA: Diagnosis not present

## 2024-06-15 DIAGNOSIS — I10 Essential (primary) hypertension: Secondary | ICD-10-CM | POA: Insufficient documentation

## 2024-06-15 DIAGNOSIS — M6281 Muscle weakness (generalized): Secondary | ICD-10-CM | POA: Diagnosis not present

## 2024-06-15 DIAGNOSIS — E11622 Type 2 diabetes mellitus with other skin ulcer: Secondary | ICD-10-CM | POA: Insufficient documentation

## 2024-06-15 DIAGNOSIS — L97822 Non-pressure chronic ulcer of other part of left lower leg with fat layer exposed: Secondary | ICD-10-CM | POA: Insufficient documentation

## 2024-06-15 DIAGNOSIS — M8668 Other chronic osteomyelitis, other site: Secondary | ICD-10-CM | POA: Diagnosis not present

## 2024-06-28 ENCOUNTER — Inpatient Hospital Stay: Attending: Oncology

## 2024-06-28 ENCOUNTER — Inpatient Hospital Stay

## 2024-06-28 ENCOUNTER — Ambulatory Visit (INDEPENDENT_AMBULATORY_CARE_PROVIDER_SITE_OTHER): Admitting: Physician Assistant

## 2024-06-28 DIAGNOSIS — N319 Neuromuscular dysfunction of bladder, unspecified: Secondary | ICD-10-CM | POA: Diagnosis not present

## 2024-06-28 DIAGNOSIS — N183 Chronic kidney disease, stage 3 unspecified: Secondary | ICD-10-CM | POA: Insufficient documentation

## 2024-06-28 DIAGNOSIS — D631 Anemia in chronic kidney disease: Secondary | ICD-10-CM | POA: Diagnosis present

## 2024-06-28 LAB — CBC WITH DIFFERENTIAL/PLATELET
Abs Immature Granulocytes: 0.02 K/uL (ref 0.00–0.07)
Basophils Absolute: 0 K/uL (ref 0.0–0.1)
Basophils Relative: 1 %
Eosinophils Absolute: 0.5 K/uL (ref 0.0–0.5)
Eosinophils Relative: 9 %
HCT: 35.7 % — ABNORMAL LOW (ref 39.0–52.0)
Hemoglobin: 10.8 g/dL — ABNORMAL LOW (ref 13.0–17.0)
Immature Granulocytes: 0 %
Lymphocytes Relative: 25 %
Lymphs Abs: 1.5 K/uL (ref 0.7–4.0)
MCH: 26.4 pg (ref 26.0–34.0)
MCHC: 30.3 g/dL (ref 30.0–36.0)
MCV: 87.3 fL (ref 80.0–100.0)
Monocytes Absolute: 0.3 K/uL (ref 0.1–1.0)
Monocytes Relative: 5 %
Neutro Abs: 3.7 K/uL (ref 1.7–7.7)
Neutrophils Relative %: 60 %
Platelets: 198 K/uL (ref 150–400)
RBC: 4.09 MIL/uL — ABNORMAL LOW (ref 4.22–5.81)
RDW: 18.5 % — ABNORMAL HIGH (ref 11.5–15.5)
WBC: 6 K/uL (ref 4.0–10.5)
nRBC: 0 % (ref 0.0–0.2)

## 2024-06-28 NOTE — Progress Notes (Signed)
 Suprapubic Cath Change  Patient is present today for a suprapubic catheter change due to urinary retention. 9ml of water was drained from the balloon, a 16FR foley cath was removed from the tract with out difficulty.  Site was cleaned and prepped in a sterile fashion with betadine.  A 16FR foley cath was replaced into the tract no complications were noted. 50ml of urine return was noted, 10 ml of sterile water was inflated into the balloon and a leg bag was attached for drainage.  Patient tolerated well. A night bag was given to patient and proper instruction was given on how to switch bags. Granulation issue noted, spoke with Lucie Roes, PA who recommends application of silver nitrate at next visit.   Performed by: Ameria Sanjurjo, CMA ([facility])  Follow up: RTC as scheduled in 1 month for SPT exchange.

## 2024-06-28 NOTE — Progress Notes (Signed)
 Hgb 10.8 today, no inj given

## 2024-07-06 ENCOUNTER — Encounter: Attending: Physician Assistant | Admitting: Physician Assistant

## 2024-07-06 DIAGNOSIS — E11622 Type 2 diabetes mellitus with other skin ulcer: Secondary | ICD-10-CM | POA: Insufficient documentation

## 2024-07-06 DIAGNOSIS — Z7901 Long term (current) use of anticoagulants: Secondary | ICD-10-CM | POA: Insufficient documentation

## 2024-07-06 DIAGNOSIS — M6281 Muscle weakness (generalized): Secondary | ICD-10-CM | POA: Insufficient documentation

## 2024-07-06 DIAGNOSIS — F319 Bipolar disorder, unspecified: Secondary | ICD-10-CM | POA: Insufficient documentation

## 2024-07-06 DIAGNOSIS — M8668 Other chronic osteomyelitis, other site: Secondary | ICD-10-CM | POA: Insufficient documentation

## 2024-07-06 DIAGNOSIS — L89154 Pressure ulcer of sacral region, stage 4: Secondary | ICD-10-CM | POA: Insufficient documentation

## 2024-07-06 DIAGNOSIS — Z87820 Personal history of traumatic brain injury: Secondary | ICD-10-CM | POA: Insufficient documentation

## 2024-07-06 DIAGNOSIS — L97822 Non-pressure chronic ulcer of other part of left lower leg with fat layer exposed: Secondary | ICD-10-CM | POA: Insufficient documentation

## 2024-07-06 DIAGNOSIS — E1169 Type 2 diabetes mellitus with other specified complication: Secondary | ICD-10-CM | POA: Diagnosis not present

## 2024-07-06 DIAGNOSIS — I10 Essential (primary) hypertension: Secondary | ICD-10-CM | POA: Insufficient documentation

## 2024-07-26 ENCOUNTER — Ambulatory Visit

## 2024-07-26 ENCOUNTER — Ambulatory Visit: Admitting: Oncology

## 2024-07-26 ENCOUNTER — Other Ambulatory Visit

## 2024-07-27 ENCOUNTER — Encounter: Admitting: Physician Assistant

## 2024-07-28 ENCOUNTER — Inpatient Hospital Stay

## 2024-07-28 ENCOUNTER — Inpatient Hospital Stay: Attending: Oncology

## 2024-07-28 ENCOUNTER — Encounter: Payer: Self-pay | Admitting: Oncology

## 2024-07-28 ENCOUNTER — Inpatient Hospital Stay: Admitting: Oncology

## 2024-07-28 VITALS — BP 116/70 | HR 79 | Temp 97.8°F | Resp 18 | Ht 66.0 in | Wt 260.0 lb

## 2024-07-28 DIAGNOSIS — D631 Anemia in chronic kidney disease: Secondary | ICD-10-CM

## 2024-07-28 DIAGNOSIS — N189 Chronic kidney disease, unspecified: Secondary | ICD-10-CM | POA: Diagnosis not present

## 2024-07-28 LAB — CBC WITH DIFFERENTIAL/PLATELET
Abs Immature Granulocytes: 0.02 10*3/uL (ref 0.00–0.07)
Basophils Absolute: 0 10*3/uL (ref 0.0–0.1)
Basophils Relative: 1 %
Eosinophils Absolute: 0.6 10*3/uL — ABNORMAL HIGH (ref 0.0–0.5)
Eosinophils Relative: 11 %
HCT: 35.2 % — ABNORMAL LOW (ref 39.0–52.0)
Hemoglobin: 10.6 g/dL — ABNORMAL LOW (ref 13.0–17.0)
Immature Granulocytes: 0 %
Lymphocytes Relative: 29 %
Lymphs Abs: 1.5 10*3/uL (ref 0.7–4.0)
MCH: 27.1 pg (ref 26.0–34.0)
MCHC: 30.1 g/dL (ref 30.0–36.0)
MCV: 90 fL (ref 80.0–100.0)
Monocytes Absolute: 0.3 10*3/uL (ref 0.1–1.0)
Monocytes Relative: 6 %
Neutro Abs: 2.9 10*3/uL (ref 1.7–7.7)
Neutrophils Relative %: 53 %
Platelets: 158 10*3/uL (ref 150–400)
RBC: 3.91 MIL/uL — ABNORMAL LOW (ref 4.22–5.81)
RDW: 19 % — ABNORMAL HIGH (ref 11.5–15.5)
WBC: 5.3 10*3/uL (ref 4.0–10.5)
nRBC: 0 % (ref 0.0–0.2)

## 2024-07-28 NOTE — Progress Notes (Signed)
 No Injection per Dr. Jacobo today.

## 2024-07-28 NOTE — Progress Notes (Signed)
 " [name]  Telephone:([phone]) [phone] Fax:([phone]) [phone]  ID: [name] OB: 26-Feb-1956  MR#: [id number]  RDW#:[id number]  Patient Care Team: Susann Agent, MD as PCP - General (Internal Medicine) [name], MD as Consulting Physician (Oncology) [name], MD as Consulting Physician (Hematology and Oncology)  CHIEF COMPLAINT: Anemia secondary to renal insufficiency.  INTERVAL HISTORY: Patient returns to clinic today for repeat laboratory, further evaluation, and continuation of Retacrit .  He continues to have chronic weakness and fatigue, but otherwise feels well. He has no neurologic complaints.  He denies any recent fevers or illnesses.  He has a good appetite and denies weight loss.  He has no chest pain, shortness of breath, cough, or hemoptysis.  He denies any nausea, vomiting, constipation, or diarrhea.  He has no melena or hematochezia.  He has no urinary complaints.  Patient offers no further specific complaints today.  REVIEW OF SYSTEMS:   Review of Systems  Constitutional:  Positive for malaise/fatigue. Negative for fever and weight loss.  Respiratory: Negative.  Negative for cough, hemoptysis and shortness of breath.   Cardiovascular: Negative.  Negative for chest pain and leg swelling.  Gastrointestinal: Negative.  Negative for abdominal pain and blood in stool.  Genitourinary: Negative.  Negative for dysuria.  Musculoskeletal: Negative.  Negative for back pain.  Skin: Negative.  Negative for rash.  Neurological:  Positive for weakness. Negative for dizziness, focal weakness and headaches.  Psychiatric/Behavioral: Negative.  The patient is not nervous/anxious.     As per HPI. Otherwise, a complete review of systems is negative.  PAST MEDICAL HISTORY: Past Medical History:  Diagnosis Date   Acute exacerbation of CHF (congestive heart failure) (HCC)    Anemia    Bipolar disorder (HCC)    Bladder mass    Cellulitis     Chronic anticoagulation    Chronic indwelling Foley catheter    CKD (chronic kidney disease) stage 3, GFR 30-59 ml/min (HCC)    Colostomy in place (HCC)    Diabetes mellitus without complication (HCC)    Frontotemporal dementia (HCC)    Heel ulcer (HCC)    Left   Hematuria    History of blood clots    Hydronephrosis of right kidney    Hyperkalemia    Hypertension    Hyponatremia    Lithium  toxicity    Metabolic acidosis    Neurogenic bladder    Obesity    Sacral decubitus ulcer    Sepsis (HCC)    TBI (traumatic brain injury) (HCC)    UTI (urinary tract infection)    VRE (vancomycin  resistant enterococcus) culture positive     PAST SURGICAL HISTORY: Past Surgical History:  Procedure Laterality Date   BACK SURGERY     CARPAL TUNNEL RELEASE Bilateral    COLON SURGERY     COLOSTOMY     CYSTOSCOPY W/ RETROGRADES Right 10/25/2022   Procedure: CYSTOSCOPY WITH RETROGRADE PYELOGRAM;  Surgeon: Francisca Redell BROCKS, MD;  Location: [facility] ORS;  Service: Urology;  Laterality: Right;   IR CYSTOSTOMY TUBE PLACEMENT/BLADDER ASPIRATION  12/12/2023   IR PERC TUN PERIT CATH WO PORT S&I /IMAG  10/11/2021   IR REMOVAL TUN CV CATH W/O FL  11/19/2021   IR US  GUIDE VASC ACCESS RIGHT  10/11/2021   SACRAL DECUBITUS ULCER EXCISION     TONSILLECTOMY      FAMILY HISTORY: Family History  Problem Relation Age of Onset   Depression Father    Deep vein thrombosis Father  ADVANCED DIRECTIVES (Y/N):  N  HEALTH MAINTENANCE: Social History   Tobacco Use   Smoking status: Never    Passive exposure: Never   Smokeless tobacco: Never  Vaping Use   Vaping status: Never Used  Substance Use Topics   Alcohol use: Not Currently   Drug use: No     Colonoscopy:  PAP:  Bone density:  Lipid panel:  No Known Allergies  Current Outpatient Medications  Medication Sig Dispense Refill   amLODipine -olmesartan  (AZOR ) 5-20 MG tablet Take 1 tablet by mouth daily.     busPIRone  (BUSPAR ) 5 MG tablet Take 1  tablet (5 mg total) by mouth 2 (two) times daily. Take daily at noon and at 4 PM 180 tablet 1   Continuous Blood Gluc Receiver (DEXCOM G7 RECEIVER) DEVI UAD for blood sugar monitoring.     ELIQUIS  5 MG TABS tablet Take 5 mg by mouth 2 (two) times daily.      Ferrous Sulfate (IRON ) 325 (65 Fe) MG TABS Take 1 tablet by mouth daily.     glipiZIDE  (GLUCOTROL  XL) 2.5 MG 24 hr tablet Take 2.5 mg by mouth daily.     memantine  (NAMENDA ) 10 MG tablet Take 10 mg by mouth 2 (two) times daily.     Oxcarbazepine  (TRILEPTAL ) 300 MG tablet Take 1 tablet by mouth twice daily 180 tablet 1   QUEtiapine  (SEROQUEL ) 25 MG tablet Take 1 tablet (25 mg total) by mouth at bedtime. May also take 1 tablet (25 mg total) daily as needed (for anxiety, agitation). 180 tablet 1   rosuvastatin  (CRESTOR ) 20 MG tablet Take 20 mg by mouth at bedtime.     sodium zirconium cyclosilicate  (LOKELMA ) 10 g PACK packet Take 10 g by mouth.     vitamin B-12 (CYANOCOBALAMIN ) 1000 MCG tablet Take 1,000 mcg by mouth daily.     No current facility-administered medications for this visit.    OBJECTIVE: Vitals:   07/28/24 1045  BP: 116/70  Pulse: 79  Resp: 18  Temp: 97.8 F (36.6 C)  SpO2: 100%     Body mass index is 41.97 kg/m.    ECOG FS:1 - Symptomatic but completely ambulatory  General: Well-developed, well-nourished, no acute distress.  Sitting in a wheelchair. Eyes: Pink conjunctiva, anicteric sclera. HEENT: Normocephalic, moist mucous membranes. Lungs: No audible wheezing or coughing. Heart: Regular rate and rhythm. Abdomen: Soft, nontender, no obvious distention. Musculoskeletal: No edema, cyanosis, or clubbing. Neuro: Alert, answering all questions appropriately. Cranial nerves grossly intact. Skin: No rashes or petechiae noted. Psych: Normal affect.  LAB RESULTS:  Lab Results  Component Value Date   NA 136 03/26/2024   K 5.6 (H) 03/26/2024   CL 111 03/26/2024   CO2 18 (L) 03/26/2024   GLUCOSE 132 (H) 03/26/2024    BUN 51 (H) 03/26/2024   CREATININE 2.54 (H) 03/26/2024   CALCIUM  8.4 (L) 03/26/2024   PROT 7.9 03/26/2024   ALBUMIN 3.1 (L) 03/26/2024   AST 21 03/26/2024   ALT 12 03/26/2024   ALKPHOS 92 03/26/2024   BILITOT 0.4 03/26/2024   GFRNONAA 27 (L) 03/26/2024   GFRAA >60 03/22/2020    Lab Results  Component Value Date   WBC 5.3 07/28/2024   NEUTROABS 2.9 07/28/2024   HGB 10.6 (L) 07/28/2024   HCT 35.2 (L) 07/28/2024   MCV 90.0 07/28/2024   PLT 158 07/28/2024   Lab Results  Component Value Date   IRON  37 (L) 11/25/2023   TIBC 188 (L) 11/25/2023   IRONPCTSAT 20  11/25/2023   Lab Results  Component Value Date   FERRITIN 27 11/25/2023     STUDIES: No results found.  ASSESSMENT: Anemia secondary to renal insufficiency  PLAN:    Anemia secondary to renal insufficiency: Patient's hemoglobin is improved to 10.6 today.  Previously, other than a mildly decreased total iron  level, all of his other laboratory work was either negative or within normal limits.  He does not require additional Retacrit  today.  He has not required treatment since September 2025.  Return to clinic in 2 and 4 months for laboratory work and continuation of Retacrit  if his hemoglobin falls below 10.0.  Patient will then return to clinic in 6 months with repeat laboratory work, further evaluation, and continuation of treatment if needed.   Renal insufficiency: Chronic and unchanged.  Patient's most recent GFR is 27.  Continue follow-up with nephrology as indicated.  I spent a total of 20 minutes reviewing chart data, face-to-face evaluation with the patient, counseling and coordination of care as detailed above.  Patient expressed understanding and was in agreement with this plan. He also understands that He can call clinic at any time with any questions, concerns, or complaints.     Evalene JINNY Reusing, MD   07/28/2024 11:04 AM     "

## 2024-07-28 NOTE — Addendum Note (Signed)
 Addended by: JASMINE DELON POUR on: 07/28/2024 11:11 AM   Modules accepted: Orders

## 2024-07-29 ENCOUNTER — Ambulatory Visit: Admitting: Physician Assistant

## 2024-07-29 ENCOUNTER — Ambulatory Visit

## 2024-07-29 DIAGNOSIS — Z435 Encounter for attention to cystostomy: Secondary | ICD-10-CM | POA: Diagnosis not present

## 2024-07-29 NOTE — Progress Notes (Signed)
 Suprapubic Cath Change  Patient is present today for a suprapubic catheter change due to urinary retention.  8ml of water was drained from the balloon, a 16FR foley cath was removed from the tract with out difficulty.  Site was cleaned and prepped in a sterile fashion with betadine.  A 16FR foley cath was replaced into the tract no complications were noted. Urine return was noted, 10 ml of sterile water was inflated into the balloon and a leg bag was attached for drainage.  Patient tolerated well.    Performed by: [name] Paulding, PA-C   Follow up: Return in about 4 weeks (around 08/26/2024) for SPT exchange.

## 2024-07-30 LAB — COLOGUARD: COLOGUARD: NEGATIVE

## 2024-08-02 ENCOUNTER — Encounter: Admitting: Physician Assistant

## 2024-08-03 ENCOUNTER — Encounter: Admitting: Physician Assistant

## 2024-08-24 ENCOUNTER — Encounter: Admitting: Physician Assistant

## 2024-08-30 ENCOUNTER — Ambulatory Visit: Admitting: Physician Assistant

## 2024-08-30 ENCOUNTER — Telehealth: Admitting: Psychiatry

## 2024-09-24 ENCOUNTER — Inpatient Hospital Stay

## 2024-11-25 ENCOUNTER — Inpatient Hospital Stay

## 2025-01-25 ENCOUNTER — Inpatient Hospital Stay

## 2025-01-25 ENCOUNTER — Inpatient Hospital Stay: Admitting: Oncology
